# Patient Record
Sex: Female | Born: 1935
Health system: Southern US, Community
[De-identification: ages and names within clinical notes are randomized; demographics above are authoritative.]

## PROBLEM LIST (undated history)

## (undated) DIAGNOSIS — E213 Hyperparathyroidism, unspecified: Secondary | ICD-10-CM

## (undated) DIAGNOSIS — R001 Bradycardia, unspecified: Secondary | ICD-10-CM

## (undated) DIAGNOSIS — E119 Type 2 diabetes mellitus without complications: Secondary | ICD-10-CM

## (undated) DIAGNOSIS — Z5189 Encounter for other specified aftercare: Secondary | ICD-10-CM

## (undated) DIAGNOSIS — I639 Cerebral infarction, unspecified: Secondary | ICD-10-CM

## (undated) DIAGNOSIS — Z992 Dependence on renal dialysis: Secondary | ICD-10-CM

## (undated) DIAGNOSIS — I4891 Unspecified atrial fibrillation: Secondary | ICD-10-CM

## (undated) DIAGNOSIS — Z9889 Other specified postprocedural states: Secondary | ICD-10-CM

## (undated) DIAGNOSIS — K284 Chronic or unspecified gastrojejunal ulcer with hemorrhage: Secondary | ICD-10-CM

## (undated) DIAGNOSIS — R51 Headache: Secondary | ICD-10-CM

## (undated) DIAGNOSIS — E559 Vitamin D deficiency, unspecified: Secondary | ICD-10-CM

## (undated) DIAGNOSIS — G4730 Sleep apnea, unspecified: Secondary | ICD-10-CM

## (undated) DIAGNOSIS — M109 Gout, unspecified: Secondary | ICD-10-CM

## (undated) DIAGNOSIS — F419 Anxiety disorder, unspecified: Secondary | ICD-10-CM

## (undated) DIAGNOSIS — N183 Chronic kidney disease, stage 3 unspecified: Secondary | ICD-10-CM

## (undated) DIAGNOSIS — L299 Pruritus, unspecified: Secondary | ICD-10-CM

## (undated) DIAGNOSIS — I10 Essential (primary) hypertension: Secondary | ICD-10-CM

## (undated) DIAGNOSIS — R519 Headache, unspecified: Secondary | ICD-10-CM

## (undated) DIAGNOSIS — Z87442 Personal history of urinary calculi: Secondary | ICD-10-CM

## (undated) DIAGNOSIS — K219 Gastro-esophageal reflux disease without esophagitis: Secondary | ICD-10-CM

## (undated) DIAGNOSIS — I499 Cardiac arrhythmia, unspecified: Secondary | ICD-10-CM

## (undated) DIAGNOSIS — N186 End stage renal disease: Secondary | ICD-10-CM

## (undated) DIAGNOSIS — D638 Anemia in other chronic diseases classified elsewhere: Secondary | ICD-10-CM

## (undated) DIAGNOSIS — K274 Chronic or unspecified peptic ulcer, site unspecified, with hemorrhage: Secondary | ICD-10-CM

## (undated) DIAGNOSIS — R112 Nausea with vomiting, unspecified: Secondary | ICD-10-CM

## (undated) DIAGNOSIS — G459 Transient cerebral ischemic attack, unspecified: Secondary | ICD-10-CM

## (undated) DIAGNOSIS — R197 Diarrhea, unspecified: Secondary | ICD-10-CM

## (undated) HISTORY — DX: Bradycardia, unspecified: R00.1

## (undated) HISTORY — PX: BACK SURGERY: SHX140

## (undated) HISTORY — DX: Sleep apnea, unspecified: G47.30

## (undated) HISTORY — DX: Transient cerebral ischemic attack, unspecified: G45.9

## (undated) HISTORY — PX: BREAST LUMPECTOMY: SHX2

## (undated) HISTORY — PX: PARATHYROID EXPLORATION: SHX732

## (undated) HISTORY — PX: FOOT SURGERY: SHX648

## (undated) HISTORY — PX: APPENDECTOMY: SHX54

## (undated) HISTORY — DX: Hyperparathyroidism, unspecified: E21.3

## (undated) HISTORY — DX: Chronic kidney disease, stage 3 (moderate): N18.3

## (undated) HISTORY — PX: ABDOMINAL HYSTERECTOMY: SHX81

## (undated) HISTORY — PX: HERNIA REPAIR: SHX51

## (undated) HISTORY — DX: Chronic kidney disease, stage 3 unspecified: N18.30

## (undated) HISTORY — DX: Gout, unspecified: M10.9

## (undated) HISTORY — DX: Anemia in other chronic diseases classified elsewhere: D63.8

## (undated) HISTORY — PX: CHOLECYSTECTOMY: SHX55

---

## 1978-02-18 HISTORY — PX: BACK SURGERY: SHX140

## 1983-02-19 DIAGNOSIS — D638 Anemia in other chronic diseases classified elsewhere: Secondary | ICD-10-CM

## 1983-02-19 HISTORY — PX: CHOLECYSTECTOMY: SHX55

## 1983-02-19 HISTORY — PX: ABDOMINAL HYSTERECTOMY: SHX81

## 1983-02-19 HISTORY — DX: Anemia in other chronic diseases classified elsewhere: D63.8

## 1988-02-19 HISTORY — PX: FOOT SURGERY: SHX648

## 1993-02-18 DIAGNOSIS — I639 Cerebral infarction, unspecified: Secondary | ICD-10-CM

## 1993-02-18 HISTORY — DX: Cerebral infarction, unspecified: I63.9

## 1994-02-18 DIAGNOSIS — G459 Transient cerebral ischemic attack, unspecified: Secondary | ICD-10-CM

## 1994-02-18 HISTORY — DX: Transient cerebral ischemic attack, unspecified: G45.9

## 1997-06-20 ENCOUNTER — Other Ambulatory Visit: Admission: RE | Admit: 1997-06-20 | Discharge: 1997-06-20 | Payer: Self-pay | Admitting: Family Medicine

## 2000-05-30 ENCOUNTER — Encounter: Payer: Self-pay | Admitting: Family Medicine

## 2000-05-30 ENCOUNTER — Ambulatory Visit (HOSPITAL_COMMUNITY): Admission: RE | Admit: 2000-05-30 | Discharge: 2000-05-30 | Payer: Self-pay | Admitting: Family Medicine

## 2001-06-08 ENCOUNTER — Ambulatory Visit (HOSPITAL_COMMUNITY): Admission: RE | Admit: 2001-06-08 | Discharge: 2001-06-08 | Payer: Self-pay | Admitting: Family Medicine

## 2001-06-08 ENCOUNTER — Encounter: Payer: Self-pay | Admitting: Family Medicine

## 2001-07-23 ENCOUNTER — Ambulatory Visit (HOSPITAL_COMMUNITY): Admission: RE | Admit: 2001-07-23 | Discharge: 2001-07-23 | Payer: Self-pay | Admitting: Surgery

## 2001-07-23 ENCOUNTER — Encounter: Payer: Self-pay | Admitting: Surgery

## 2002-07-05 ENCOUNTER — Encounter: Payer: Self-pay | Admitting: Family Medicine

## 2002-07-05 ENCOUNTER — Ambulatory Visit (HOSPITAL_COMMUNITY): Admission: RE | Admit: 2002-07-05 | Discharge: 2002-07-05 | Payer: Self-pay | Admitting: Family Medicine

## 2002-11-16 ENCOUNTER — Encounter: Payer: Self-pay | Admitting: Surgery

## 2002-11-16 ENCOUNTER — Ambulatory Visit (HOSPITAL_COMMUNITY): Admission: RE | Admit: 2002-11-16 | Discharge: 2002-11-16 | Payer: Self-pay | Admitting: Surgery

## 2003-09-08 ENCOUNTER — Ambulatory Visit (HOSPITAL_COMMUNITY): Admission: RE | Admit: 2003-09-08 | Discharge: 2003-09-08 | Payer: Self-pay | Admitting: Family Medicine

## 2004-09-20 ENCOUNTER — Ambulatory Visit (HOSPITAL_COMMUNITY): Admission: RE | Admit: 2004-09-20 | Discharge: 2004-09-20 | Payer: Self-pay | Admitting: Family Medicine

## 2005-10-01 ENCOUNTER — Ambulatory Visit (HOSPITAL_COMMUNITY): Admission: RE | Admit: 2005-10-01 | Discharge: 2005-10-01 | Payer: Self-pay | Admitting: Family Medicine

## 2005-12-23 ENCOUNTER — Ambulatory Visit (HOSPITAL_COMMUNITY): Admission: RE | Admit: 2005-12-23 | Discharge: 2005-12-23 | Payer: Self-pay | Admitting: Surgery

## 2006-10-03 ENCOUNTER — Ambulatory Visit (HOSPITAL_COMMUNITY): Admission: RE | Admit: 2006-10-03 | Discharge: 2006-10-03 | Payer: Self-pay | Admitting: Family Medicine

## 2007-11-24 ENCOUNTER — Ambulatory Visit (HOSPITAL_COMMUNITY): Admission: RE | Admit: 2007-11-24 | Discharge: 2007-11-24 | Payer: Self-pay | Admitting: Family Medicine

## 2007-12-03 ENCOUNTER — Encounter: Admission: RE | Admit: 2007-12-03 | Discharge: 2007-12-03 | Payer: Self-pay | Admitting: Family Medicine

## 2007-12-03 ENCOUNTER — Encounter (INDEPENDENT_AMBULATORY_CARE_PROVIDER_SITE_OTHER): Payer: Self-pay | Admitting: Diagnostic Radiology

## 2008-02-22 ENCOUNTER — Encounter: Admission: RE | Admit: 2008-02-22 | Discharge: 2008-02-22 | Payer: Self-pay | Admitting: Surgery

## 2008-02-23 ENCOUNTER — Encounter (INDEPENDENT_AMBULATORY_CARE_PROVIDER_SITE_OTHER): Payer: Self-pay | Admitting: Surgery

## 2008-02-23 ENCOUNTER — Encounter: Admission: RE | Admit: 2008-02-23 | Discharge: 2008-02-23 | Payer: Self-pay | Admitting: Surgery

## 2008-02-23 ENCOUNTER — Ambulatory Visit (HOSPITAL_BASED_OUTPATIENT_CLINIC_OR_DEPARTMENT_OTHER): Admission: RE | Admit: 2008-02-23 | Discharge: 2008-02-23 | Payer: Self-pay | Admitting: Surgery

## 2008-07-21 ENCOUNTER — Ambulatory Visit (HOSPITAL_COMMUNITY): Admission: RE | Admit: 2008-07-21 | Discharge: 2008-07-21 | Payer: Self-pay | Admitting: Surgery

## 2008-09-29 ENCOUNTER — Ambulatory Visit (HOSPITAL_COMMUNITY): Admission: RE | Admit: 2008-09-29 | Discharge: 2008-09-30 | Payer: Self-pay | Admitting: Surgery

## 2008-12-05 ENCOUNTER — Encounter: Admission: RE | Admit: 2008-12-05 | Discharge: 2008-12-05 | Payer: Self-pay | Admitting: Family Medicine

## 2009-12-11 ENCOUNTER — Encounter: Admission: RE | Admit: 2009-12-11 | Discharge: 2009-12-11 | Payer: Self-pay | Admitting: Family Medicine

## 2010-03-11 ENCOUNTER — Encounter: Payer: Self-pay | Admitting: Family Medicine

## 2010-05-26 LAB — DIFFERENTIAL
Basophils Absolute: 0 10*3/uL (ref 0.0–0.1)
Basophils Relative: 1 % (ref 0–1)
Eosinophils Absolute: 0.1 10*3/uL (ref 0.0–0.7)
Lymphocytes Relative: 34 % (ref 12–46)
Monocytes Absolute: 0.4 10*3/uL (ref 0.1–1.0)
Neutro Abs: 5.1 10*3/uL (ref 1.7–7.7)

## 2010-05-26 LAB — URINALYSIS, ROUTINE W REFLEX MICROSCOPIC
Bilirubin Urine: NEGATIVE
Ketones, ur: NEGATIVE mg/dL
Nitrite: NEGATIVE
Protein, ur: NEGATIVE mg/dL
pH: 7.5 (ref 5.0–8.0)

## 2010-05-26 LAB — PROTIME-INR
INR: 0.9 (ref 0.00–1.49)
Prothrombin Time: 11.9 seconds (ref 11.6–15.2)

## 2010-05-26 LAB — COMPREHENSIVE METABOLIC PANEL
CO2: 32 mEq/L (ref 19–32)
Calcium: 11.6 mg/dL — ABNORMAL HIGH (ref 8.4–10.5)
Chloride: 104 mEq/L (ref 96–112)
Creatinine, Ser: 1.62 mg/dL — ABNORMAL HIGH (ref 0.4–1.2)
Potassium: 4.1 mEq/L (ref 3.5–5.1)
Sodium: 143 mEq/L (ref 135–145)

## 2010-05-26 LAB — CALCIUM
Calcium: 8.9 mg/dL (ref 8.4–10.5)
Calcium: 9.2 mg/dL (ref 8.4–10.5)

## 2010-05-26 LAB — CBC: WBC: 8.6 10*3/uL (ref 4.0–10.5)

## 2010-06-04 LAB — CBC
HCT: 42.1 % (ref 36.0–46.0)
Hemoglobin: 14.4 g/dL (ref 12.0–15.0)
MCHC: 34.2 g/dL (ref 30.0–36.0)
MCV: 92 fL (ref 78.0–100.0)
Platelets: 225 10*3/uL (ref 150–400)
RBC: 4.58 MIL/uL (ref 3.87–5.11)
RDW: 13.6 % (ref 11.5–15.5)
WBC: 7 10*3/uL (ref 4.0–10.5)

## 2010-06-04 LAB — BASIC METABOLIC PANEL
BUN: 17 mg/dL (ref 6–23)
CO2: 28 mEq/L (ref 19–32)
Calcium: 10.6 mg/dL — ABNORMAL HIGH (ref 8.4–10.5)
Chloride: 101 mEq/L (ref 96–112)
Creatinine, Ser: 1.3 mg/dL — ABNORMAL HIGH (ref 0.4–1.2)
GFR calc Af Amer: 49 mL/min — ABNORMAL LOW (ref >60)
GFR calc non Af Amer: 40 mL/min — ABNORMAL LOW (ref >60)
Glucose, Bld: 84 mg/dL (ref 70–99)
Potassium: 4.9 mEq/L (ref 3.5–5.1)
Sodium: 140 mEq/L (ref 135–145)

## 2010-06-04 LAB — URINALYSIS, ROUTINE W REFLEX MICROSCOPIC
Bilirubin Urine: NEGATIVE
Glucose, UA: NEGATIVE mg/dL
Hgb urine dipstick: NEGATIVE
Ketones, ur: NEGATIVE mg/dL
Nitrite: NEGATIVE
Protein, ur: NEGATIVE mg/dL
Specific Gravity, Urine: 1.009 (ref 1.005–1.030)
Urobilinogen, UA: 0.2 mg/dL (ref 0.0–1.0)
pH: 7 (ref 5.0–8.0)

## 2010-06-04 LAB — DIFFERENTIAL
Basophils Absolute: 0 10*3/uL (ref 0.0–0.1)
Basophils Relative: 1 % (ref 0–1)
Eosinophils Absolute: 0.1 10*3/uL (ref 0.0–0.7)
Eosinophils Relative: 1 % (ref 0–5)
Lymphocytes Relative: 34 % (ref 12–46)
Lymphs Abs: 2.4 10*3/uL (ref 0.7–4.0)
Monocytes Absolute: 0.4 10*3/uL (ref 0.1–1.0)
Monocytes Relative: 5 % (ref 3–12)
Neutro Abs: 4.2 10*3/uL (ref 1.7–7.7)
Neutrophils Relative %: 59 % (ref 43–77)

## 2010-07-03 NOTE — Op Note (Signed)
NAMEJACQUEL, OVERHOLT              ACCOUNT NO.:  [id number]   MEDICAL RECORD NO.:  [id number]          PATIENT TYPE:  OIB   LOCATION:  [id number]                         FACILITY:  [facility]   PHYSICIAN:  Earnstine Regal, MD      DATE OF BIRTH:  Jun 21, 1935   DATE OF PROCEDURE:  09/29/2008  DATE OF DISCHARGE:                               OPERATIVE REPORT   PREOPERATIVE DIAGNOSIS:  Primary hyperparathyroidism.   POSTOPERATIVE DIAGNOSIS:  Primary hyperparathyroidism.   PROCEDURE:  Neck exploration.   SURGEON:  Earnstine Regal, MD, FACS   ASSISTANT:  Stark Klein, MD   ANESTHESIA:  General per Dr. Lillia Abed.   PREPARATION:  ChloraPrep.   COMPLICATIONS:  None.   ESTIMATED BLOOD LOSS:  Minimal.   INDICATIONS:  The patient is a 75 year old white female followed for  many years for presumed primary hyperparathyroidism.  The patient has  undergone multiple nuclear medicine sestamibi scans in 2003, 2004, and  2007.  All of these studies have failed to localize a parathyroid  adenoma.  However, biochemical evidence of parathyroid disease persists  with an elevated intact parathyroid hormone level of 155 and a calcium  level in the upper range of normal at 10.2 and a 24-hour urine  collection for calcium elevated at 275.  After discussion with the  patient, a decision has been made to proceed with neck exploration.  It  helps at identifying a parathyroid adenoma for resection.   BODY OF REPORT:  Procedure was done in OR #16 at the [place].  The patient was brought to the operating room and  placed in supine position on the operating room table.  Following  administration of general anesthesia, the patient was positioned and  then prepped and draped in the usual strict aseptic fashion.  After  ascertaining that an adequate level of anesthesia had been achieved, a  Kocher incision was made with a #15 blade.  Dissection was carried  through subcutaneous tissues.   Platysma was divided with the  electrocautery and hemostasis was achieved.  Subplatysmal flaps were  elevated cephalad and caudad.  A Mahorner and self-retaining retractors  were placed for exposure.  Strap muscles were incised in the midline.  Dissection was initially begun on the left.  Left thyroid lobe was  completely mobilized.  Dissection was carried from high above the  superior pole of the thyroid along the posterior aspect of the thyroid  lobe to the inferior pole of the thyroid and into the thyrothymic tract.  No evidence of adenoma was identified.  Dissection was carried  posteriorly to the spine and the retroesophageal space was opened.  Again, no evidence of parathyroid adenoma.  Inferior thyroid artery was  dissected out from the carotid to the thyroid.  Recurrent nerve was  dissected out and no evidence of parathyroid adenoma was found within  several centimeters of the crossing of the recurrent nerve and the  inferior thyroid artery.  Carotid sheath was opened and the carotid  artery was dissected out for several centimeters in each direction.  No  evidence  of adenoma within the carotid sheath on the left.   Dry pack was placed in the left neck.  We then turned our attention to  the right side.  Again, the right thyroid lobe was exposed by reflecting  strap muscles laterally.  Small venous tributaries were divided between  Ligaclips.  The entire right neck was dissected out in a similar fashion  extending from high above the right superior pole along the posterior  aspect of the thyroid lobe to the inferior pole and down the thyrothymic  tract.  No parathyroid adenoma was identified.  Carotid sheath was  opened widely and explored.  No evidence of adenoma.  Inferior thyroid  artery was skeletonized from the carotid to the thyroid gland.  Recurrent laryngeal nerve was dissected out for several centimeters  along its length.  No evidence of adenoma seen in the  tracheoesophageal  groove.  Esophagus was also mobilized from the right and again there was  no evidence of adenoma in the retroesophageal space.  Dissection was  carried beneath the sternum to the level of the innominate.  Partial  thymectomy was performed.  No evidence of parathyroid adenoma.   Indeed throughout the exploration, no definitive parathyroid tissue was  identified at any point in time, although there was a large amount of  adipose tissue present within the operative field.   After re-exploring both the left and right sides again, a decision was  made to discontinue exploration.  Neck was irrigated with warm saline.  Surgicel was placed in the operative field.  Strap muscles were  reapproximated in the midline with interrupted 3-0 Vicryl sutures.  Platysma was closed with interrupted 3-0 Vicryl sutures.  Skin was  closed with running 4-0 Monocryl subcuticular suture.  Wound was washed  and dried and benzoin and Steri-Strips were applied.  Sterile dressings  were applied.  The patient was awakened from anesthesia and brought to  the recovery room in stable condition.  The patient tolerated the  procedure well.      Earnstine Regal, MD  Electronically Signed     Earnstine Regal, MD  Electronically Signed    TMG/MEDQ  D:  09/29/2008  T:  09/30/2008  Job:  [id number]   cc:   Tammy R. Modena Morrow, M.D.

## 2010-07-06 NOTE — Op Note (Signed)
NAMEMECIA, KRAFT              ACCOUNT NO.:  [id number]   MEDICAL RECORD NO.:  [id number]          PATIENT TYPE:  AMB   LOCATION:  [place]                          FACILITY:  [facility]   PHYSICIAN:  Earnstine Regal, MD      DATE OF BIRTH:  [birth year]   DATE OF PROCEDURE:  02/23/2007  DATE OF DISCHARGE:                               OPERATIVE REPORT   PREOPERATIVE DIAGNOSIS:  Abnormal mammogram of left breast, vascular  neoplasm with cytologic atypia.   POSTOPERATIVE DIAGNOSIS:  Abnormal mammogram of left breast, vascular  neoplasm with cytologic atypia.   PROCEDURE:  Left breast excisional biopsy with wire localization.   SURGEON:  Earnstine Regal, MD, FACS   ANESTHESIA:  Local with intravenous sedation.   PREPARATION:  Betadine.   COMPLICATIONS:  None.   BLOOD LOSS:  Minimal.   INDICATIONS:  The patient is a 75 year old white female, well known to  my surgical practice.  Routine mammogram showed an abnormality in the  left breast.  Vacuum-assisted core needle biopsy demonstrated a vascular  neoplasm with mild cytologic atypia.  Tumor measured approximately 6 mm  in size.  Surgical excision is recommended.   BODY OF REPORT:  Procedure was done in OR #8 at the [facility]  The patient was brought to the operating room and placed in the  supine position on the operating room table.  Following administration  of intravenous sedation, the skin was prepped and draped in the usual  strict aseptic fashion.  After ascertaining that an adequate level of  sedation had been obtained, the skin was anesthetized with local  anesthetic.  A curvilinear incision was made at the site of guidewire  placement in the upper outer quadrant of the left breast.  Dissection  was carried into the subcutaneous tissues with electrocautery and  hemostasis was obtained with electrocautery.  A core of breast tissue  was excised around the guidewire using the electrocautery for  hemostasis.   Dissection was carried to the tip of the guidewire.  The  entire specimen was excised with the guidewire in position.  Using  Faxitron mammogram machine, an image was obtained which confirmed that  the lesion in question was present within the excised specimen.  Specimen was then submitted to pathology for review.   Hemostasis was obtained throughout the wound.  Skin was closed with  running 4-0 Vicryl subcuticular suture.  Wound was washed and dried, and  benzoin and Steri-Strips were applied.  Sterile dressings were applied.  The patient was awakened from anesthesia and brought to the recovery  room in stable condition.  The patient tolerated the procedure well.      Earnstine Regal, MD  Electronically Signed     TMG/MEDQ  D:  02/23/2008  T:  02/24/2008  Job:  [id number]   cc:   Tammy R. Modena Morrow, M.D.  Chaney Born, M.D.

## 2010-11-09 ENCOUNTER — Other Ambulatory Visit: Payer: Self-pay | Admitting: Family Medicine

## 2010-11-09 DIAGNOSIS — Z1231 Encounter for screening mammogram for malignant neoplasm of breast: Secondary | ICD-10-CM

## 2010-12-17 ENCOUNTER — Ambulatory Visit
Admission: RE | Admit: 2010-12-17 | Discharge: 2010-12-17 | Disposition: A | Discharge status: Home / Self Care | Payer: Medicare Other | Source: Ambulatory Visit | Attending: Family Medicine | Admitting: Family Medicine

## 2010-12-17 DIAGNOSIS — Z1231 Encounter for screening mammogram for malignant neoplasm of breast: Secondary | ICD-10-CM

## 2011-07-28 ENCOUNTER — Encounter (HOSPITAL_COMMUNITY): Payer: Self-pay | Admitting: Emergency Medicine

## 2011-07-28 ENCOUNTER — Emergency Department (HOSPITAL_COMMUNITY): Payer: Medicare Other

## 2011-07-28 ENCOUNTER — Emergency Department (HOSPITAL_COMMUNITY)
Admission: EM | Admit: 2011-07-28 | Discharge: 2011-07-28 | Disposition: A | Discharge status: Home / Self Care | Payer: Medicare Other | Attending: Emergency Medicine | Admitting: Emergency Medicine

## 2011-07-28 DIAGNOSIS — W010XXA Fall on same level from slipping, tripping and stumbling without subsequent striking against object, initial encounter: Secondary | ICD-10-CM | POA: Insufficient documentation

## 2011-07-28 DIAGNOSIS — I10 Essential (primary) hypertension: Secondary | ICD-10-CM | POA: Insufficient documentation

## 2011-07-28 DIAGNOSIS — S0003XA Contusion of scalp, initial encounter: Secondary | ICD-10-CM | POA: Insufficient documentation

## 2011-07-28 DIAGNOSIS — S20219A Contusion of unspecified front wall of thorax, initial encounter: Secondary | ICD-10-CM | POA: Insufficient documentation

## 2011-07-28 DIAGNOSIS — Z79899 Other long term (current) drug therapy: Secondary | ICD-10-CM | POA: Insufficient documentation

## 2011-07-28 DIAGNOSIS — Y92009 Unspecified place in unspecified non-institutional (private) residence as the place of occurrence of the external cause: Secondary | ICD-10-CM | POA: Insufficient documentation

## 2011-07-28 DIAGNOSIS — Z7901 Long term (current) use of anticoagulants: Secondary | ICD-10-CM | POA: Insufficient documentation

## 2011-07-28 DIAGNOSIS — S0093XA Contusion of unspecified part of head, initial encounter: Secondary | ICD-10-CM

## 2011-07-28 DIAGNOSIS — E079 Disorder of thyroid, unspecified: Secondary | ICD-10-CM | POA: Insufficient documentation

## 2011-07-28 HISTORY — DX: Essential (primary) hypertension: I10

## 2011-07-28 LAB — CBC
HCT: 40.4 % (ref 36.0–46.0)
Hemoglobin: 13.7 g/dL (ref 12.0–15.0)
MCHC: 33.9 g/dL (ref 30.0–36.0)
RDW: 13.6 % (ref 11.5–15.5)
WBC: 8.9 10*3/uL (ref 4.0–10.5)

## 2011-07-28 LAB — BASIC METABOLIC PANEL
Chloride: 103 mEq/L (ref 96–112)
GFR calc Af Amer: 36 mL/min — ABNORMAL LOW (ref >90)
GFR calc non Af Amer: 31 mL/min — ABNORMAL LOW (ref >90)
Glucose, Bld: 107 mg/dL — ABNORMAL HIGH (ref 70–99)
Potassium: 4.1 mEq/L (ref 3.5–5.1)
Sodium: 142 mEq/L (ref 135–145)

## 2011-07-28 LAB — PROTIME-INR
INR: 0.94 (ref 0.00–1.49)
Prothrombin Time: 12.8 seconds (ref 11.6–15.2)

## 2011-07-28 NOTE — ED Provider Notes (Signed)
76 year old female tripped over a hose and fell striking her head. There was no loss of consciousness. She did suffer a contusion and small laceration to the left side of her forehead. She is neurologically intact although she does have several for areas that have bruises. CT scan showed no bony or intracranial injury. She will need to be advised of risk of delayed bleeding given that she is on Plavix.  Kim Fuel, MD [phone]

## 2011-07-28 NOTE — Discharge Instructions (Signed)
Chest Contusion You have been checked for injuries to your chest. Your caregiver has not found injuries serious enough to require hospitalization. It is common to have bruises and sore muscles after an injury. These tend to feel worse the first 24 hours. You may gradually develop more stiffness and soreness over the next several hours to several days. This usually feels worse the first morning following your injury. After a few days, you will usually begin to improve. The amount of improvement depends on the amount of damage. Following the accident, if the pain in any area continues to increase or you develop new areas of pain, you should see your primary caregiver or return to the Emergency Department for re-evaluation. HOME CARE INSTRUCTIONS   Put ice on sore areas every 2 hours for 20 minutes while awake for the next 2 days.   Drink extra fluids. Do not drink alcohol.   Activity as tolerated. Lifting may make pain worse.   Only take over-the-counter or prescription medicines for pain, discomfort, or fever as directed by your caregiver. Do not use aspirin. This may increase bruising or increase bleeding.  SEEK IMMEDIATE MEDICAL CARE IF:   There is a worsening of any of the problems that brought you in for care.   Shortness of breath, dizziness or fainting develop.   You have chest pain, difficulty breathing, or develop pain going down the left arm or up into jaw.   You feel sick to your stomach (nausea), vomiting or sweats.   You have increasing belly (abdominal) discomfort.   There is blood in your urine, stool, or if you vomit blood.   There is pain in either shoulder in an area where a shoulder strap would be.   You have feelings of lightheadedness, or if you should have a fainting episode.   You have numbness, tingling, weakness, or problems with the use of your arms or legs.   Severe headaches not relieved with medications develop.   You have a change in bowel or bladder  control.   There is increasing pain in any areas of the body.  If you feel your symptoms are worsening, and you are not able to see your primary caregiver, return to the Emergency Department immediately. MAKE SURE YOU:   Understand these instructions.   Will watch your condition.   Will get help right away if you are not doing well or get worse.  Document Released: 10/30/2000 Document Revised: 01/24/2011 Document Reviewed: 09/23/2007 [facility] Patient Information 2012 [place].Facial and Scalp Contusions You have a contusion (bruise) on your face or scalp. Injuries around the face and head generally cause a lot of swelling, especially around the eyes. This is because the blood supply to this area is good and tissues are loose. Swelling from a contusion is usually better in 2-3 days. It may take a week or longer for a "black eye" to clear up completely. HOME CARE INSTRUCTIONS   Apply ice packs to the injured area for about 15 to 20 minutes, 3 to 4 times a day, for the first couple days. This helps keep swelling down.   Use mild pain medicine as needed or instructed by your caregiver.   You may have a mild headache, slight dizziness, nausea, and weakness for a few days. This usually clears up with bed rest and mild pain medications.   Contact your caregiver if you are concerned about facial defects or have any difficulty with your bite or develop pain with chewing.  SEEK IMMEDIATE MEDICAL CARE IF:  You develop severe pain or a headache, unrelieved by medication.   You develop unusual sleepiness, confusion, personality changes, or vomiting.   You have a persistent nosebleed, double or blurred vision, or drainage from the nose or ear.   You have difficulty walking or using your arms or legs.  MAKE SURE YOU:   Understand these instructions.   Will watch your condition.   Will get help right away if you are not doing well or get worse.  Document Released: 03/14/2004 Document  Revised: 01/24/2011 Document Reviewed: 12/21/2010 [facility] Patient Information 2012 [place].

## 2011-07-28 NOTE — ED Provider Notes (Signed)
Medical screening examination/treatment/procedure(s) were conducted as a shared visit with non-physician practitioner(s) and myself.  I personally evaluated the patient during the encounter   Delora Fuel, MD [phone]

## 2011-07-28 NOTE — ED Notes (Signed)
Pt tripped over garden hose this morning. Pt hit head on concrete. Pt has laceration to forehead and abrasion to right elbow. Pt unsure if +LOC.

## 2011-07-28 NOTE — ED Provider Notes (Signed)
History     CSN: [id number]  Arrival date & time 07/28/11  37   First MD Initiated Contact with Patient 07/28/11 1129      Chief Complaint  Patient presents with  . Fall  . Head Injury  . Head Laceration  . Abrasion    (Consider location/radiation/quality/duration/timing/severity/associated sxs/prior treatment) HPI  Patient to the ED after fall, having tripped over a garden hose while straightening up her porch this morning. She states, ' I first hit my chest and then my forehead hit next. I only saw a lot of blood so i knew i needed to come to hte ED'. The patient denies LOC, syncope, hx of multiple falls, or pain anywhere else. She is on Plavix blood thinners. The patient is in no acute distress and VSS at this time. Denies headache, declines pain medications at this time.  Past Medical History  Diagnosis Date  . Hypertension   . Thyroid disease   . Renal disorder     Past Surgical History  Procedure Date  . Cholecystectomy   . Abdominal hysterectomy   . Back surgery   . Breast lumpectomy   . Parathyroid exploration     History reviewed. No pertinent family history.  History  Substance Use Topics  . Smoking status: Never Smoker   . Smokeless tobacco: Not on file  . Alcohol Use: No    OB History    Grav Para Term Preterm Abortions TAB SAB Ect Mult Living                  Review of Systems   HEENT: denies blurry vision or change in hearing PULMONARY: Denies difficulty breathing and SOB CARDIAC: denies chest pain or heart palpitations MUSCULOSKELETAL:  denies being unable to ambulate ABDOMEN AL: denies abdominal pain GU: denies loss of bowel or urinary control NEURO: denies numbness and tingling in extremities SKIN: no new rashes PSYCH: patient behavior is normal NECK: denies neck pain     Allergies  Codeine and Statins  Home Medications   Current Outpatient Rx  Name Route Sig Dispense Refill  . CINACALCET HCL 30 MG PO TABS Oral Take 30 mg  by mouth daily with supper.    Marland Kitchen CLONIDINE HCL 0.1 MG PO TABS Oral Take 0.1 mg by mouth at bedtime.    . CLOPIDOGREL BISULFATE 75 MG PO TABS Oral Take 75 mg by mouth every morning.    Marland Kitchen DIPHENHYDRAMINE-APAP (SLEEP) 25-500 MG PO TABS Oral Take 1 tablet by mouth at bedtime.    . FUROSEMIDE 20 MG PO TABS Oral Take 20 mg by mouth every morning.    Marland Kitchen LOSARTAN POTASSIUM 100 MG PO TABS Oral Take 100 mg by mouth every morning.    Marland Kitchen METOPROLOL SUCCINATE ER 100 MG PO TB24 Oral Take 100 mg by mouth daily with supper. Take with or immediately following a meal.    . FISH OIL PO Oral Take 3,000 mg by mouth daily.      BP 157/88  Pulse 67  Temp(Src) 97.8 F (36.6 C) (Oral)  Resp 16  SpO2 98%  Physical Exam  Nursing note and vitals reviewed. Constitutional: She appears well-developed and well-nourished. No distress.  HENT:  Head: Normocephalic and atraumatic.    Mouth/Throat: Oropharynx is clear and moist and mucous membranes are normal.       Patient has 1.5cm laceration over left eyebrow which bleeding is controlled with surrounding hematoma.  Eyes: Pupils are equal, round, and reactive to light.  Neck: Normal range of motion. Neck supple.  Cardiovascular: Normal rate and regular rhythm.   Pulmonary/Chest: Effort normal. She exhibits tenderness. She exhibits no laceration, no crepitus, no deformity and no swelling.    Abdominal: Soft.  Neurological: She is alert.  Skin: Skin is warm and dry.    ED Course  Procedures (including critical care time)  Labs Reviewed  BASIC METABOLIC PANEL - Abnormal; Notable for the following:    Glucose, Bld 107 (*)    Creatinine, Ser 1.58 (*)    GFR calc non Af Amer 31 (*)    GFR calc Af Amer 36 (*)    All other components within normal limits  PROTIME-INR  CBC   Dg Chest 2 View  07/28/2011  *RADIOLOGY REPORT*  Clinical Data: Fall with chest pain.  CHEST - 2 VIEW  Comparison: 09/27/2008  Findings: Two views of the chest were obtained.  There is mild  haziness at the left lung base.  No evidence for a pneumothorax. Heart and mediastinum are stable.  Stable linear density near the lateral left eighth rib may be related to the pleural surface and not consistent with a rib fracture.  Surgical clips in the right upper abdomen.  Trachea is midline. Few streaky densities at the right lung base. Surgical clips in the right lower neck.  IMPRESSION: Linear and vague basilar densities.  Findings most likely represent atelectasis.  Original Report Authenticated By: Markus Daft, M.D.   Ct Head Wo Contrast  07/28/2011  *RADIOLOGY REPORT*  Clinical Data: Fall, head injury  CT HEAD WITHOUT CONTRAST  Technique:  Contiguous axial images were obtained from the base of the skull through the vertex without contrast.  Comparison: None.  Findings: No skull fracture is noted.  There is soft tissue swelling and scalp swelling in the left frontal region.  Small subcutaneous hematoma measures 9 mm.  Atherosclerotic calcifications of carotid siphon are noted.  Mild cerebral atrophy.  No intracranial hemorrhage, mass effect or midline shift.  No acute infarction.  No mass lesion is noted on this unenhanced scan.  No intra or extra-axial fluid collection.  IMPRESSION:  1.  No skull fracture is noted.  There is scalp swelling subcutaneous stranding and small subcutaneous hematoma in the left frontal region.  Mild cerebral atrophy.  No acute intracranial abnormality.  Original Report Authenticated By: Lahoma Crocker, M.D.     1. Head contusion   2. Contusion of ribs       MDM  Dr. Roxanne Mins has evaluated patient as well. Head CT and rib images are non acute.  Laceration to forehead closed with Dermabond. Pt tolerated procedure well.  Patient is to follow-up with PCP Dr. Modena Morrow within the next week.  Can take Tylenol or Ibuprofen for pain.  Pt has been advised of the symptoms that warrant their return to the ED. Patient has voiced understanding and has agreed to follow-up with the PCP  or specialist.         Linus Mako, [place] 07/28/11 1330

## 2011-12-04 ENCOUNTER — Other Ambulatory Visit: Payer: Self-pay | Admitting: Family Medicine

## 2011-12-04 DIAGNOSIS — Z1231 Encounter for screening mammogram for malignant neoplasm of breast: Secondary | ICD-10-CM

## 2012-01-02 ENCOUNTER — Ambulatory Visit
Admission: RE | Admit: 2012-01-02 | Discharge: 2012-01-02 | Disposition: A | Discharge status: Home / Self Care | Payer: Medicare Other | Source: Ambulatory Visit | Attending: Family Medicine | Admitting: Family Medicine

## 2012-01-02 DIAGNOSIS — Z1231 Encounter for screening mammogram for malignant neoplasm of breast: Secondary | ICD-10-CM

## 2012-02-19 HISTORY — PX: EYE SURGERY: SHX253

## 2012-04-20 ENCOUNTER — Other Ambulatory Visit: Payer: Self-pay | Admitting: *Deleted

## 2012-09-18 ENCOUNTER — Ambulatory Visit (INDEPENDENT_AMBULATORY_CARE_PROVIDER_SITE_OTHER): Payer: Medicare Other | Admitting: Cardiology

## 2012-09-18 ENCOUNTER — Encounter: Payer: Self-pay | Admitting: Cardiology

## 2012-09-18 VITALS — BP 152/102 | HR 61 | Ht 64.0 in | Wt 165.8 lb

## 2012-09-18 DIAGNOSIS — R5383 Other fatigue: Secondary | ICD-10-CM | POA: Insufficient documentation

## 2012-09-18 DIAGNOSIS — R5381 Other malaise: Secondary | ICD-10-CM

## 2012-09-18 DIAGNOSIS — N183 Chronic kidney disease, stage 3 unspecified: Secondary | ICD-10-CM

## 2012-09-18 DIAGNOSIS — I498 Other specified cardiac arrhythmias: Secondary | ICD-10-CM

## 2012-09-18 DIAGNOSIS — R001 Bradycardia, unspecified: Secondary | ICD-10-CM

## 2012-09-18 NOTE — Progress Notes (Signed)
Kim Burns Date of Birth: June 01, 1935 Medical Record [id number]  History of Present Illness: Kim Burns is seen in the request of Dr. Marval Regal for evaluation of bradycardia. She is a pleasant 77 year old white female who has a history of chronic hypertension and chronic kidney disease stage III. Patient has been on chronic therapy with clonidine and metoprolol. Recently she was started on an ARB. She mentioned that she had had some symptoms of fatigue. She has been noted to be bradycardic at times with heart rates down to 50 on her home blood pressure monitor. On her last renal followup her pulse rate was 63. She states she has felt fatigued for a number of years. This actually predates her being placed on either metoprolol or clonidine. She denies any dizziness or syncope. She has no significant chest pain or shortness of breath.   Current Outpatient Prescriptions on File Prior to Visit  Medication Sig Dispense Refill  . cinacalcet (SENSIPAR) 30 MG tablet Take 30 mg by mouth daily with supper.      . cloNIDine (CATAPRES) 0.1 MG tablet Take 0.1 mg by mouth at bedtime.      . clopidogrel (PLAVIX) 75 MG tablet Take 75 mg by mouth every morning.      . diphenhydramine-acetaminophen (TYLENOL PM) 25-500 MG TABS Take 1 tablet by mouth at bedtime.      . furosemide (LASIX) 20 MG tablet Take 20 mg by mouth every morning.      . metoprolol succinate (TOPROL-XL) 100 MG 24 hr tablet Take 100 mg by mouth daily with supper. Take with or immediately following a meal.      . Omega-3 Fatty Acids (FISH OIL PO) Take 3,000 mg by mouth daily.      Marland Kitchen losartan (COZAAR) 100 MG tablet Take 100 mg by mouth every morning.       No current facility-administered medications on file prior to visit.    Allergies  Allergen Reactions  . Codeine Other (See Comments)    headache  . Statins Other (See Comments)    Muscle weakness    Past Medical History  Diagnosis Date  . Hypertension   . Thyroid disease     . CKD (chronic kidney disease) stage 3, GFR 30-59 ml/min   . Bradycardia   . TIA (transient ischemic attack)   . Anemia of chronic disease   . Gout   . Hyperparathyroidism   . Sleep apnea     Past Surgical History  Procedure Laterality Date  . Cholecystectomy    . Abdominal hysterectomy    . Back surgery    . Breast lumpectomy    . Parathyroid exploration      History  Smoking status  . Never Smoker   Smokeless tobacco  . Not on file    History  Alcohol Use No    Family History  Problem Relation Age of Onset  . Heart disease Mother     Review of Systems: The review of systems is positive for chronic fatigue. She dates this to when her husband passed away 5 years ago. She becomes very upset discussing his death. All other systems were reviewed and are negative.  Physical Exam: BP 152/102  Pulse 61  Ht 5\' 4"  (1.626 m)  Wt 165 lb 12.8 oz (75.206 kg)  BMI 28.45 kg/m2  SpO2 99% She is a pleasant, elderly white female in no acute distress. HEENT: Normocephalic, atraumatic. Pupils are equal round and reactive. Extraocular movements are full. Oropharynx  is clear. Neck is without jugular venous distention, adenopathy, thyroid, or bruits. Lungs: Clear Cardiovascular: Regular rate and rhythm. Normal S1 and S2. No gallop, murmur, or click. Abdomen: Soft and nontender. No hepatosplenomegaly or masses. Bowel sounds are positive. Extremities are without edema. Pulses are 2+ and symmetric. Skin: Warm and dry Neuro: Alert and oriented x3, cranial nerves II through XII are intact. Mood is depressed. LABORATORY DATA: ECG demonstrates normal sinus rhythm with a rate of 59 beats per minute. QS complex in leads V1 and V2. Otherwise normal.  Assessment / Plan: 1. Bradycardia. Patient's pulse rate is appropriate for the medication she is currently taking. There is really no indication that she has any symptoms related to her bradycardia. She has chronic fatigue but this predates her  medication and I think is unlikely to be related to bradycardia. Her cardiac exam is normal. I recommended no further evaluation or treatment at this time. If she should develop symptoms of dizziness or syncope we will need to reevaluate. 2. Fatigue. Chronic. Patient does seem to have a component of depression. I recommended she continue on her current antihypertensive therapy. Certainly clonidine can contribute to fatigue but her blood pressure has been difficult to control and with her kidney disease her options are more limited. 3. Chronic kidney disease stage III. 4. Hypertension

## 2012-09-18 NOTE — Patient Instructions (Signed)
Continue your current therapy  Let me know if you develop dizzyness or passing out.  I will see you as needed.

## 2012-11-27 ENCOUNTER — Other Ambulatory Visit: Payer: Self-pay

## 2012-11-27 DIAGNOSIS — Z1231 Encounter for screening mammogram for malignant neoplasm of breast: Secondary | ICD-10-CM

## 2013-01-05 ENCOUNTER — Ambulatory Visit
Admission: RE | Admit: 2013-01-05 | Discharge: 2013-01-05 | Disposition: A | Discharge status: Home / Self Care | Payer: Medicare Other | Source: Ambulatory Visit

## 2013-01-05 DIAGNOSIS — Z1231 Encounter for screening mammogram for malignant neoplasm of breast: Secondary | ICD-10-CM

## 2013-03-29 ENCOUNTER — Encounter (INDEPENDENT_AMBULATORY_CARE_PROVIDER_SITE_OTHER): Payer: Self-pay | Admitting: Surgery

## 2013-03-29 ENCOUNTER — Ambulatory Visit (INDEPENDENT_AMBULATORY_CARE_PROVIDER_SITE_OTHER): Payer: Medicare Other | Admitting: Surgery

## 2013-03-29 VITALS — BP 138/78 | HR 72 | Temp 98.3°F | Resp 14 | Ht 64.5 in | Wt 169.8 lb

## 2013-03-29 DIAGNOSIS — R220 Localized swelling, mass and lump, head: Secondary | ICD-10-CM

## 2013-03-29 DIAGNOSIS — R221 Localized swelling, mass and lump, neck: Secondary | ICD-10-CM

## 2013-03-29 NOTE — Progress Notes (Signed)
General Surgery [facility]  Chief Complaint  Patient presents with  . New Evaluation    evaluate mass in neck - referral from Dr. Florina Ou    HISTORY: Patient is a 78 year old female known to my surgical practice. Patient had undergone previous parathyroidectomy and breast biopsy. She is now referred by her primary care physician for evaluation of a soft tissue mass in the posterior neck. Patient is not sure how long this has been present but it is increasing in size and causing her some discomfort. She was evaluated by her primary care physician is now referred for possible surgical excision.  Other than parathyroid exploration, the patient has not had prior surgery on the neck. She has had no previous such lesions removed.  Past Medical History  Diagnosis Date  . Hypertension   . Thyroid disease   . CKD (chronic kidney disease) stage 3, GFR 30-59 ml/min   . Bradycardia   . TIA (transient ischemic attack)   . Anemia of chronic disease   . Gout   . Hyperparathyroidism   . Sleep apnea     Current Outpatient Prescriptions  Medication Sig Dispense Refill  . atorvastatin (LIPITOR) 10 MG tablet       . calcitRIOL (ROCALTROL) 0.25 MCG capsule       . cinacalcet (SENSIPAR) 30 MG tablet Take 30 mg by mouth daily with supper.      . cloNIDine (CATAPRES) 0.1 MG tablet Take 0.1 mg by mouth at bedtime.      . clopidogrel (PLAVIX) 75 MG tablet Take 75 mg by mouth every morning.      . diphenhydramine-acetaminophen (TYLENOL PM) 25-500 MG TABS Take 1 tablet by mouth at bedtime.      . Ergocalciferol (VITAMIN D2 PO) Take by mouth once a week.      . Febuxostat (ULORIC) 80 MG TABS Take by mouth daily.      . fenofibrate (TRICOR) 145 MG tablet Take 145 mg by mouth daily.      . furosemide (LASIX) 20 MG tablet Take 20 mg by mouth every morning.      . Omega-3 Fatty Acids (FISH OIL PO) Take 3,000 mg by mouth daily.      Marland Kitchen telmisartan (MICARDIS) 80 MG tablet Take 80 mg by  mouth daily.       No current facility-administered medications for this visit.    Allergies  Allergen Reactions  . Codeine Other (See Comments)    headache  . Statins Other (See Comments)    Muscle weakness    Family History  Problem Relation Age of Onset  . Heart disease Mother   . Stroke Mother   . Cancer Father     colon  . Cancer Brother     brain    History   Social History  . Marital Status: Widowed    Spouse Name: N/A    Number of Children: N/A  . Years of Education: N/A   Social History Main Topics  . Smoking status: Never Smoker   . Smokeless tobacco: Never Used  . Alcohol Use: No  . Drug Use: No  . Sexual Activity: None   Other Topics Concern  . None   Social History Narrative  . None    REVIEW OF SYSTEMS - PERTINENT POSITIVES ONLY: Gradual enlargement. Mild discomfort. No history of infection.  EXAM: Filed Vitals:   03/29/13 1319  BP: 138/78  Pulse: 72  Temp: 98.3 F (36.8 C)  Resp: 14    GENERAL: well-developed, well-nourished, no acute distress HEENT: normocephalic; pupils equal and reactive; sclerae clear; dentition good; mucous membranes moist NECK:  Well-healed anterior cervical incision; no palpable masses in the thyroid bed; symmetric on extension; no palpable anterior or posterior cervical lymphadenopathy; no supraclavicular masses; no tenderness; posteriorly just to the right of midline is a 2.5 cm soft tissue mass which appears attached to the skin and extended into the underlying subcutaneous tissues and possibly into the underlying musculature. CHEST: clear to auscultation bilaterally without rales, rhonchi, or wheezes CARDIAC: regular rate and rhythm without significant murmur; peripheral pulses are full EXT:  non-tender without edema; no deformity NEURO: no gross focal deficits; no sign of tremor   LABORATORY RESULTS: See [facility] (CHL-Epic) for most recent results  RADIOLOGY RESULTS: See [facility]  (CHL-Epic) for most recent results  IMPRESSION: Soft tissue mass posterior neck, 2.5 cm, enlarging  PLAN: Patient and I discussed this finding. This may represent an epidermal inclusion cyst or a benign lipoma. However, it is moderate in size and appears to extend through the subcutaneous tissues and possibly into the underlying musculature. It has been enlarging. It does cause her some discomfort. Therefore I have recommended surgical excision for definitive diagnosis and management. Patient agrees. We will arrange for this as an outpatient surgical procedure.  The risks and benefits of the procedure have been discussed at length with the patient.  The patient understands the proposed procedure, potential alternative treatments, and the course of recovery to be expected.  All of the patient's questions have been answered at this time.  The patient wishes to proceed with surgery.  Earnstine Regal, MD, [facility]  Primary Care Physician: Florina Ou, MD

## 2013-03-29 NOTE — Patient Instructions (Signed)
Epidermal Cyst An epidermal cyst is sometimes called a sebaceous cyst, epidermal inclusion cyst, or infundibular cyst. These cysts usually contain a substance that looks "pasty" or "cheesy" and may have a bad smell. This substance is a protein called keratin. Epidermal cysts are usually found on the face, neck, or trunk. They may also occur in the vaginal area or other parts of the genitalia of both men and women. Epidermal cysts are usually small, painless, slow-growing bumps or lumps that move freely under the skin. It is important not to try to pop them. This may cause an infection and lead to tenderness and swelling. CAUSES  Epidermal cysts may be caused by a deep penetrating injury to the skin or a plugged hair follicle, often associated with acne. SYMPTOMS  Epidermal cysts can become inflamed and cause:  Redness.  Tenderness.  Increased temperature of the skin over the bumps or lumps.  Grayish-white, bad smelling material that drains from the bump or lump. DIAGNOSIS  Epidermal cysts are easily diagnosed by your caregiver during an exam. Rarely, a tissue sample (biopsy) may be taken to rule out other conditions that may resemble epidermal cysts. TREATMENT   Epidermal cysts often get better and disappear on their own. They are rarely ever cancerous.  If a cyst becomes infected, it may become inflamed and tender. This may require opening and draining the cyst. Treatment with antibiotics may be necessary. When the infection is gone, the cyst may be removed with minor surgery.  Small, inflamed cysts can often be treated with antibiotics or by injecting steroid medicines.  Sometimes, epidermal cysts become large and bothersome. If this happens, surgical removal in your caregiver's office may be necessary. HOME CARE INSTRUCTIONS  Only take over-the-counter or prescription medicines as directed by your caregiver.  Take your antibiotics as directed. Finish them even if you start to feel  better. SEEK MEDICAL CARE IF:   Your cyst becomes tender, red, or swollen.  Your condition is not improving or is getting worse.  You have any other questions or concerns. MAKE SURE YOU:  Understand these instructions.  Will watch your condition.  Will get help right away if you are not doing well or get worse. Document Released: 01/06/2004 Document Revised: 04/29/2011 Document Reviewed: 08/13/2010 Exit[employer] Patient Information 2014 [employer].  

## 2013-04-29 ENCOUNTER — Telehealth (INDEPENDENT_AMBULATORY_CARE_PROVIDER_SITE_OTHER): Payer: Self-pay

## 2013-04-29 NOTE — Telephone Encounter (Signed)
Pt called stating cyst on back of neck opened and drained this week. She states area has gone down a lot. No fever. No redness. Pt wants to keep surgery date. Pt states she wants to still have cyst removed so that it will not fill back up. I advised pt that it will make removal easier if it is not swollen. Pt advised if area becomes red or feverish she needs to call asap. Pt advised if area becomes infected it may change the surgical procedure or pt will need to be on antibiotics. Pt states she understands.

## 2013-05-06 ENCOUNTER — Other Ambulatory Visit (INDEPENDENT_AMBULATORY_CARE_PROVIDER_SITE_OTHER): Payer: Self-pay | Admitting: *Deleted

## 2013-05-06 ENCOUNTER — Other Ambulatory Visit (INDEPENDENT_AMBULATORY_CARE_PROVIDER_SITE_OTHER): Payer: Self-pay | Admitting: Surgery

## 2013-05-06 DIAGNOSIS — L723 Sebaceous cyst: Secondary | ICD-10-CM

## 2013-05-06 MED ORDER — HYDROCODONE-ACETAMINOPHEN 5-325 MG PO TABS: 1.0000 | ORAL_TABLET | ORAL | Status: DC | PRN

## 2013-05-11 ENCOUNTER — Telehealth (INDEPENDENT_AMBULATORY_CARE_PROVIDER_SITE_OTHER): Payer: Self-pay

## 2013-05-11 NOTE — Telephone Encounter (Signed)
LMOM for pt to return call. Pt can be given benign path and po appt date of 05-31-13 arrive at 10:45 am.

## 2013-05-31 ENCOUNTER — Encounter (INDEPENDENT_AMBULATORY_CARE_PROVIDER_SITE_OTHER): Payer: Self-pay | Admitting: Surgery

## 2013-05-31 ENCOUNTER — Ambulatory Visit (INDEPENDENT_AMBULATORY_CARE_PROVIDER_SITE_OTHER): Payer: Medicare Other | Admitting: Surgery

## 2013-05-31 VITALS — BP 150/84 | HR 71 | Temp 98.4°F | Resp 18 | Ht 64.0 in | Wt 168.6 lb

## 2013-05-31 DIAGNOSIS — R220 Localized swelling, mass and lump, head: Secondary | ICD-10-CM

## 2013-05-31 DIAGNOSIS — R221 Localized swelling, mass and lump, neck: Secondary | ICD-10-CM

## 2013-05-31 NOTE — Progress Notes (Signed)
General Surgery [facility]  Chief Complaint  Patient presents with  . Routine Post Op    excision cyst from neck on 05/06/2013    HISTORY: Patient is a 78 year old female who underwent excision of a small cyst from the right posterior neck on 05/06/2013. Final pathology is benign.  EXAM: Wound is healing nicely. No sign of infection. No sign of seroma. No tenderness.  IMPRESSION: Status post excision of cyst right posterior neck  PLAN: Patient will apply topical creams to her incision.  Patient will return for surgical care as needed.  Earnstine Regal, MD, [facility]   Visit Diagnoses: 1. Localized swelling, mass and lump, neck

## 2013-05-31 NOTE — Patient Instructions (Signed)
  CARE OF INCISION   Apply cocoa butter/vitamin E cream (Palmer's brand) to your incision 2 - 3 times daily.  Massage cream into incision for one minute with each application.  Use sunscreen (50 SPF or higher) for first 6 months after surgery if area is exposed to sun.  You may alternate Mederma or other scar reducing cream with cocoa butter cream if desired.       Anetha Slagel M. Arlena Marsan, MD, FACS      [facility]      Office: [phone]    

## 2013-06-17 ENCOUNTER — Ambulatory Visit
Admission: RE | Admit: 2013-06-17 | Discharge: 2013-06-17 | Disposition: A | Discharge status: Home / Self Care | Payer: 59 | Source: Ambulatory Visit | Attending: Nephrology | Admitting: Nephrology

## 2013-06-17 ENCOUNTER — Other Ambulatory Visit: Payer: Self-pay | Admitting: Nephrology

## 2013-06-17 DIAGNOSIS — R109 Unspecified abdominal pain: Secondary | ICD-10-CM

## 2013-06-18 ENCOUNTER — Other Ambulatory Visit: Payer: Self-pay | Admitting: Urology

## 2013-06-18 ENCOUNTER — Encounter (HOSPITAL_COMMUNITY): Payer: Self-pay | Admitting: Emergency Medicine

## 2013-06-18 ENCOUNTER — Encounter (HOSPITAL_COMMUNITY): Payer: Medicare Other | Admitting: Anesthesiology

## 2013-06-18 ENCOUNTER — Inpatient Hospital Stay: Admit: 2013-06-18 | Payer: Self-pay | Admitting: Urology

## 2013-06-18 ENCOUNTER — Encounter (HOSPITAL_COMMUNITY)
Admission: EM | Disposition: A | Discharge status: Home / Self Care | Payer: Self-pay | Source: Home / Self Care | Attending: Urology

## 2013-06-18 ENCOUNTER — Emergency Department (HOSPITAL_COMMUNITY): Payer: Medicare Other

## 2013-06-18 ENCOUNTER — Emergency Department (HOSPITAL_COMMUNITY): Payer: Medicare Other | Admitting: Anesthesiology

## 2013-06-18 ENCOUNTER — Inpatient Hospital Stay (HOSPITAL_COMMUNITY)
Admission: EM | Admit: 2013-06-18 | Discharge: 2013-06-22 | DRG: 871 | Disposition: A | Discharge status: Home / Self Care | Payer: Medicare Other | Attending: Urology | Admitting: Urology

## 2013-06-18 DIAGNOSIS — Z8249 Family history of ischemic heart disease and other diseases of the circulatory system: Secondary | ICD-10-CM

## 2013-06-18 DIAGNOSIS — E872 Acidosis, unspecified: Secondary | ICD-10-CM | POA: Diagnosis present

## 2013-06-18 DIAGNOSIS — N183 Chronic kidney disease, stage 3 unspecified: Secondary | ICD-10-CM

## 2013-06-18 DIAGNOSIS — N201 Calculus of ureter: Secondary | ICD-10-CM | POA: Diagnosis present

## 2013-06-18 DIAGNOSIS — E875 Hyperkalemia: Secondary | ICD-10-CM | POA: Diagnosis present

## 2013-06-18 DIAGNOSIS — N390 Urinary tract infection, site not specified: Secondary | ICD-10-CM

## 2013-06-18 DIAGNOSIS — R6520 Severe sepsis without septic shock: Secondary | ICD-10-CM

## 2013-06-18 DIAGNOSIS — M109 Gout, unspecified: Secondary | ICD-10-CM | POA: Diagnosis present

## 2013-06-18 DIAGNOSIS — G4733 Obstructive sleep apnea (adult) (pediatric): Secondary | ICD-10-CM | POA: Diagnosis present

## 2013-06-18 DIAGNOSIS — E213 Hyperparathyroidism, unspecified: Secondary | ICD-10-CM | POA: Diagnosis present

## 2013-06-18 DIAGNOSIS — N3289 Other specified disorders of bladder: Secondary | ICD-10-CM | POA: Diagnosis present

## 2013-06-18 DIAGNOSIS — J90 Pleural effusion, not elsewhere classified: Secondary | ICD-10-CM | POA: Diagnosis present

## 2013-06-18 DIAGNOSIS — Z87442 Personal history of urinary calculi: Secondary | ICD-10-CM

## 2013-06-18 DIAGNOSIS — N139 Obstructive and reflux uropathy, unspecified: Secondary | ICD-10-CM | POA: Diagnosis present

## 2013-06-18 DIAGNOSIS — I129 Hypertensive chronic kidney disease with stage 1 through stage 4 chronic kidney disease, or unspecified chronic kidney disease: Secondary | ICD-10-CM | POA: Diagnosis present

## 2013-06-18 DIAGNOSIS — Z800 Family history of malignant neoplasm of digestive organs: Secondary | ICD-10-CM

## 2013-06-18 DIAGNOSIS — Z808 Family history of malignant neoplasm of other organs or systems: Secondary | ICD-10-CM

## 2013-06-18 DIAGNOSIS — Z8673 Personal history of transient ischemic attack (TIA), and cerebral infarction without residual deficits: Secondary | ICD-10-CM

## 2013-06-18 DIAGNOSIS — Z823 Family history of stroke: Secondary | ICD-10-CM

## 2013-06-18 DIAGNOSIS — R6521 Severe sepsis with septic shock: Secondary | ICD-10-CM

## 2013-06-18 DIAGNOSIS — Z79899 Other long term (current) drug therapy: Secondary | ICD-10-CM

## 2013-06-18 DIAGNOSIS — A419 Sepsis, unspecified organism: Principal | ICD-10-CM | POA: Diagnosis present

## 2013-06-18 DIAGNOSIS — N179 Acute kidney failure, unspecified: Secondary | ICD-10-CM | POA: Diagnosis present

## 2013-06-18 HISTORY — DX: Personal history of urinary calculi: Z87.442

## 2013-06-18 HISTORY — PX: CYSTOSCOPY WITH URETEROSCOPY AND STENT PLACEMENT: SHX6377

## 2013-06-18 LAB — CBC WITH DIFFERENTIAL/PLATELET
BASOS ABS: 0 10*3/uL (ref 0.0–0.1)
BASOS ABS: 0 10*3/uL (ref 0.0–0.1)
Basophils Relative: 0 % (ref 0–1)
Basophils Relative: 0 % (ref 0–1)
EOS ABS: 0 10*3/uL (ref 0.0–0.7)
EOS PCT: 0 % (ref 0–5)
Eosinophils Absolute: 0 10*3/uL (ref 0.0–0.7)
Eosinophils Relative: 0 % (ref 0–5)
HCT: 30.1 % — ABNORMAL LOW (ref 36.0–46.0)
HEMATOCRIT: 33.5 % — AB (ref 36.0–46.0)
Hemoglobin: 10.9 g/dL — ABNORMAL LOW (ref 12.0–15.0)
Hemoglobin: 10 g/dL — ABNORMAL LOW (ref 12.0–15.0)
LYMPHS ABS: 0.9 10*3/uL (ref 0.7–4.0)
LYMPHS PCT: 3 % — AB (ref 12–46)
Lymphocytes Relative: 3 % — ABNORMAL LOW (ref 12–46)
Lymphs Abs: 0.8 10*3/uL (ref 0.7–4.0)
MCH: 29.7 pg (ref 26.0–34.0)
MCH: 30.5 pg (ref 26.0–34.0)
MCHC: 32.5 g/dL (ref 30.0–36.0)
MCHC: 33.2 g/dL (ref 30.0–36.0)
MCV: 91.3 fL (ref 78.0–100.0)
MCV: 91.8 fL (ref 78.0–100.0)
Monocytes Absolute: 0.9 10*3/uL (ref 0.1–1.0)
Monocytes Absolute: 0.7 10*3/uL (ref 0.1–1.0)
Monocytes Relative: 3 % (ref 3–12)
Monocytes Relative: 3 % (ref 3–12)
NEUTROS ABS: 24 10*3/uL — AB (ref 1.7–7.7)
Neutro Abs: 27 10*3/uL — ABNORMAL HIGH (ref 1.7–7.7)
Neutrophils Relative %: 94 % — ABNORMAL HIGH (ref 43–77)
Neutrophils Relative %: 94 % — ABNORMAL HIGH (ref 43–77)
Platelets: 216 10*3/uL (ref 150–400)
Platelets: 180 10*3/uL (ref 150–400)
RBC: 3.67 MIL/uL — ABNORMAL LOW (ref 3.87–5.11)
RBC: 3.28 MIL/uL — ABNORMAL LOW (ref 3.87–5.11)
RDW: 14.1 % (ref 11.5–15.5)
RDW: 14.4 % (ref 11.5–15.5)
WBC Morphology: INCREASED
WBC Morphology: INCREASED
WBC: 28.8 10*3/uL — ABNORMAL HIGH (ref 4.0–10.5)
WBC: 25.5 10*3/uL — ABNORMAL HIGH (ref 4.0–10.5)

## 2013-06-18 LAB — COMPREHENSIVE METABOLIC PANEL
ALBUMIN: 3 g/dL — AB (ref 3.5–5.2)
ALK PHOS: 24 U/L — AB (ref 39–117)
ALT: 21 U/L (ref 0–35)
ALT: 29 U/L (ref 0–35)
AST: 32 U/L (ref 0–37)
AST: 41 U/L — ABNORMAL HIGH (ref 0–37)
Albumin: 2.3 g/dL — ABNORMAL LOW (ref 3.5–5.2)
Alkaline Phosphatase: 26 U/L — ABNORMAL LOW (ref 39–117)
BILIRUBIN TOTAL: 0.6 mg/dL (ref 0.3–1.2)
BUN: 68 mg/dL — AB (ref 6–23)
BUN: 58 mg/dL — ABNORMAL HIGH (ref 6–23)
CALCIUM: 8.9 mg/dL (ref 8.4–10.5)
CHLORIDE: 108 meq/L (ref 96–112)
CO2: 18 mEq/L — ABNORMAL LOW (ref 19–32)
CO2: 17 mEq/L — ABNORMAL LOW (ref 19–32)
CREATININE: 4.55 mg/dL — AB (ref 0.50–1.10)
Calcium: 6.6 mg/dL — ABNORMAL LOW (ref 8.4–10.5)
Chloride: 97 mEq/L (ref 96–112)
Creatinine, Ser: 3.57 mg/dL — ABNORMAL HIGH (ref 0.50–1.10)
GFR calc Af Amer: 10 mL/min — ABNORMAL LOW (ref >90)
GFR calc non Af Amer: 9 mL/min — ABNORMAL LOW (ref >90)
GFR calc non Af Amer: 12 mL/min — ABNORMAL LOW (ref >90)
GFR, EST AFRICAN AMERICAN: 13 mL/min — AB (ref >90)
GLUCOSE: 130 mg/dL — AB (ref 70–99)
Glucose, Bld: 106 mg/dL — ABNORMAL HIGH (ref 70–99)
POTASSIUM: 5.4 meq/L — AB (ref 3.7–5.3)
Potassium: 4.3 mEq/L (ref 3.7–5.3)
Sodium: 137 mEq/L (ref 137–147)
Sodium: 140 mEq/L (ref 137–147)
TOTAL PROTEIN: 6.5 g/dL (ref 6.0–8.3)
TOTAL PROTEIN: 5.4 g/dL — AB (ref 6.0–8.3)
Total Bilirubin: 0.6 mg/dL (ref 0.3–1.2)

## 2013-06-18 LAB — URINALYSIS, ROUTINE W REFLEX MICROSCOPIC
Glucose, UA: NEGATIVE mg/dL
KETONES UR: NEGATIVE mg/dL
NITRITE: NEGATIVE
Protein, ur: 100 mg/dL — AB
SPECIFIC GRAVITY, URINE: 1.013 (ref 1.005–1.030)
Urobilinogen, UA: 1 mg/dL (ref 0.0–1.0)
pH: 5 (ref 5.0–8.0)

## 2013-06-18 LAB — PRO B NATRIURETIC PEPTIDE: Pro B Natriuretic peptide (BNP): 14523 pg/mL — ABNORMAL HIGH (ref 0–450)

## 2013-06-18 LAB — URINE MICROSCOPIC-ADD ON

## 2013-06-18 LAB — I-STAT CG4 LACTIC ACID, ED
LACTIC ACID, VENOUS: 3.98 mmol/L — AB (ref 0.5–2.2)
LACTIC ACID, VENOUS: 1.49 mmol/L (ref 0.5–2.2)

## 2013-06-18 LAB — BASIC METABOLIC PANEL
BUN: 62 mg/dL — AB (ref 6–23)
CO2: 16 meq/L — AB (ref 19–32)
CREATININE: 3.89 mg/dL — AB (ref 0.50–1.10)
Calcium: 7.4 mg/dL — ABNORMAL LOW (ref 8.4–10.5)
Chloride: 102 mEq/L (ref 96–112)
GFR calc non Af Amer: 10 mL/min — ABNORMAL LOW (ref >90)
GFR, EST AFRICAN AMERICAN: 12 mL/min — AB (ref >90)
Glucose, Bld: 114 mg/dL — ABNORMAL HIGH (ref 70–99)
Potassium: 4.6 mEq/L (ref 3.7–5.3)
Sodium: 140 mEq/L (ref 137–147)

## 2013-06-18 LAB — TROPONIN I: Troponin I: <0.3 ng/mL (ref <0.30)

## 2013-06-18 LAB — LACTIC ACID, PLASMA: LACTIC ACID, VENOUS: 3.5 mmol/L — AB (ref 0.5–2.2)

## 2013-06-18 LAB — CANCER ANTIGEN 19-9: CA 19-9: 15.6 U/mL — ABNORMAL LOW (ref <35.0)

## 2013-06-18 LAB — CORTISOL: Cortisol, Plasma: 52.5 ug/dL

## 2013-06-18 LAB — POC OCCULT BLOOD, ED: Fecal Occult Bld: NEGATIVE

## 2013-06-18 LAB — TSH: TSH: 2.69 u[IU]/mL (ref 0.350–4.500)

## 2013-06-18 LAB — LIPASE, BLOOD: LIPASE: 35 U/L (ref 11–59)

## 2013-06-18 SURGERY — CYSTOURETEROSCOPY, WITH STENT INSERTION
Anesthesia: General | Site: Ureter | Laterality: Left

## 2013-06-18 MED ORDER — ATORVASTATIN CALCIUM 10 MG PO TABS
10.0000 mg | ORAL_TABLET | Freq: Every day | ORAL | Status: DC
  Administered 2013-06-18 – 2013-06-21 (×4): 10 mg via ORAL
  Filled 2013-06-18 (×5): qty 1

## 2013-06-18 MED ORDER — 0.9 % SODIUM CHLORIDE (POUR BTL) OPTIME
TOPICAL | Status: DC | PRN
  Administered 2013-06-18: 1000 mL

## 2013-06-18 MED ORDER — LIDOCAINE HCL 2 % EX GEL
CUTANEOUS | Status: AC
  Filled 2013-06-18: qty 10

## 2013-06-18 MED ORDER — SODIUM CHLORIDE 0.9 % IV BOLUS (SEPSIS)
1000.0000 mL | Freq: Once | INTRAVENOUS | Status: AC
  Administered 2013-06-18: 1000 mL via INTRAVENOUS

## 2013-06-18 MED ORDER — DEXTROSE 5 % IV SOLN
1.0000 g | Freq: Once | INTRAVENOUS | Status: AC
  Administered 2013-06-18: 1 g via INTRAVENOUS
  Filled 2013-06-18: qty 10

## 2013-06-18 MED ORDER — DEXAMETHASONE SODIUM PHOSPHATE 10 MG/ML IJ SOLN
INTRAMUSCULAR | Status: AC
  Filled 2013-06-18: qty 1

## 2013-06-18 MED ORDER — INSULIN ASPART 100 UNIT/ML ~~LOC~~ SOLN
0.0000 [IU] | SUBCUTANEOUS | Status: DC
  Administered 2013-06-19 (×3): 1 [IU] via SUBCUTANEOUS

## 2013-06-18 MED ORDER — DEXAMETHASONE SODIUM PHOSPHATE 10 MG/ML IJ SOLN
INTRAMUSCULAR | Status: DC | PRN
  Administered 2013-06-18: 10 mg via INTRAVENOUS

## 2013-06-18 MED ORDER — FENTANYL CITRATE 0.05 MG/ML IJ SOLN
25.0000 ug | INTRAMUSCULAR | Status: DC | PRN
  Administered 2013-06-18: 12.5 ug via INTRAVENOUS

## 2013-06-18 MED ORDER — MEPERIDINE HCL 50 MG/ML IJ SOLN: 6.2500 mg | INTRAMUSCULAR | Status: DC | PRN

## 2013-06-18 MED ORDER — DEXTROSE 5 % IV SOLN
1.0000 g | INTRAVENOUS | Status: DC
  Administered 2013-06-19: 1 g via INTRAVENOUS
  Filled 2013-06-18 (×2): qty 10

## 2013-06-18 MED ORDER — PANTOPRAZOLE SODIUM 40 MG IV SOLR
40.0000 mg | INTRAVENOUS | Status: DC
  Administered 2013-06-18 – 2013-06-19 (×2): 40 mg via INTRAVENOUS
  Filled 2013-06-18 (×3): qty 40

## 2013-06-18 MED ORDER — PROMETHAZINE HCL 25 MG/ML IJ SOLN: 6.2500 mg | INTRAMUSCULAR | Status: DC | PRN

## 2013-06-18 MED ORDER — PHENYLEPHRINE 40 MCG/ML (10ML) SYRINGE FOR IV PUSH (FOR BLOOD PRESSURE SUPPORT)
PREFILLED_SYRINGE | INTRAVENOUS | Status: AC
  Filled 2013-06-18: qty 10

## 2013-06-18 MED ORDER — PROPOFOL 10 MG/ML IV BOLUS
INTRAVENOUS | Status: DC | PRN
  Administered 2013-06-18: 100 mg via INTRAVENOUS

## 2013-06-18 MED ORDER — SODIUM CHLORIDE 0.9 % IV SOLN
1500.0000 mg | Freq: Once | INTRAVENOUS | Status: AC
  Administered 2013-06-18: 1500 mg via INTRAVENOUS
  Filled 2013-06-18: qty 1500

## 2013-06-18 MED ORDER — CINACALCET HCL 30 MG PO TABS
30.0000 mg | ORAL_TABLET | Freq: Every day | ORAL | Status: DC
  Administered 2013-06-19 – 2013-06-21 (×3): 30 mg via ORAL
  Filled 2013-06-18 (×4): qty 1

## 2013-06-18 MED ORDER — CALCITRIOL 0.25 MCG PO CAPS
0.2500 ug | ORAL_CAPSULE | ORAL | Status: DC
  Administered 2013-06-18 – 2013-06-21 (×2): 0.25 ug via ORAL
  Filled 2013-06-18 (×3): qty 1

## 2013-06-18 MED ORDER — PROPOFOL 10 MG/ML IV BOLUS
INTRAVENOUS | Status: AC
  Filled 2013-06-18: qty 20

## 2013-06-18 MED ORDER — DEXTROSE 5 % IV SOLN: 1.0000 g | INTRAVENOUS | Status: DC

## 2013-06-18 MED ORDER — SODIUM CHLORIDE 0.9 % IV SOLN
1000.0000 mL | INTRAVENOUS | Status: DC
  Administered 2013-06-18: 17:00:00 via INTRAVENOUS
  Administered 2013-06-19: 1000 mL via INTRAVENOUS

## 2013-06-18 MED ORDER — ONDANSETRON HCL 4 MG/2ML IJ SOLN
INTRAMUSCULAR | Status: DC | PRN
  Administered 2013-06-18: 4 mg via INTRAVENOUS

## 2013-06-18 MED ORDER — SUCCINYLCHOLINE CHLORIDE 20 MG/ML IJ SOLN
INTRAMUSCULAR | Status: DC | PRN
Start: 2013-06-18 — End: 2013-06-18
  Administered 2013-06-18: 100 mg via INTRAVENOUS

## 2013-06-18 MED ORDER — SODIUM CHLORIDE 0.9 % IV SOLN
INTRAVENOUS | Status: DC
  Administered 2013-06-18 (×4): via INTRAVENOUS

## 2013-06-18 MED ORDER — FENTANYL CITRATE 0.05 MG/ML IJ SOLN
INTRAMUSCULAR | Status: AC
  Filled 2013-06-18: qty 2

## 2013-06-18 MED ORDER — PHENYLEPHRINE HCL 10 MG/ML IJ SOLN
INTRAMUSCULAR | Status: DC | PRN
  Administered 2013-06-18: 40 ug via INTRAVENOUS
  Administered 2013-06-18 (×3): 120 ug via INTRAVENOUS

## 2013-06-18 MED ORDER — METOPROLOL SUCCINATE ER 100 MG PO TB24
100.0000 mg | ORAL_TABLET | Freq: Every day | ORAL | Status: DC
  Administered 2013-06-21 – 2013-06-22 (×2): 100 mg via ORAL
  Filled 2013-06-18 (×5): qty 1

## 2013-06-18 MED ORDER — ONDANSETRON HCL 4 MG/2ML IJ SOLN
INTRAMUSCULAR | Status: AC
  Filled 2013-06-18: qty 2

## 2013-06-18 MED ORDER — VANCOMYCIN HCL IN DEXTROSE 1-5 GM/200ML-% IV SOLN: 1000.0000 mg | Freq: Once | INTRAVENOUS | Status: DC

## 2013-06-18 MED ORDER — SODIUM CHLORIDE 0.9 % IV SOLN
1000.0000 mL | Freq: Once | INTRAVENOUS | Status: AC
  Administered 2013-06-18: 1000 mL via INTRAVENOUS

## 2013-06-18 MED ORDER — ONDANSETRON HCL 4 MG/2ML IJ SOLN
4.0000 mg | Freq: Once | INTRAMUSCULAR | Status: DC
Start: 2013-06-18 — End: 2013-06-20
  Filled 2013-06-18: qty 2

## 2013-06-18 MED ORDER — ENOXAPARIN SODIUM 30 MG/0.3ML ~~LOC~~ SOLN
30.0000 mg | SUBCUTANEOUS | Status: DC
  Administered 2013-06-18 – 2013-06-21 (×4): 30 mg via SUBCUTANEOUS
  Filled 2013-06-18 (×5): qty 0.3

## 2013-06-18 SURGICAL SUPPLY — 16 items
BAG URO CATCHER STRL LF (DRAPE) ×2 IMPLANT
BASKET ZERO TIP NITINOL 2.4FR (BASKET) IMPLANT
CATH FOLEY 2WAY SLVR  5CC 16FR (CATHETERS) ×1
CATH FOLEY 2WAY SLVR 5CC 16FR (CATHETERS) ×1 IMPLANT
CATH INTERMIT  6FR 70CM (CATHETERS) IMPLANT
CLOTH BEACON ORANGE TIMEOUT ST (SAFETY) ×2 IMPLANT
DRAPE CAMERA CLOSED 9X96 (DRAPES) ×2 IMPLANT
GLOVE BIOGEL M STRL SZ7.5 (GLOVE) ×2 IMPLANT
GOWN STRL REUS W/TWL LRG LVL3 (GOWN DISPOSABLE) ×4 IMPLANT
GUIDEWIRE ANG ZIPWIRE 038X150 (WIRE) IMPLANT
GUIDEWIRE STR DUAL SENSOR (WIRE) ×2 IMPLANT
MANIFOLD NEPTUNE II (INSTRUMENTS) ×2 IMPLANT
PACK CYSTO (CUSTOM PROCEDURE TRAY) ×2 IMPLANT
STENT CONTOUR 6FRX24X.038 (STENTS) ×2 IMPLANT
SYR 30ML LL (SYRINGE) ×2 IMPLANT
TUBING CONNECTING 10 (TUBING) ×2 IMPLANT

## 2013-06-18 NOTE — Transfer of Care (Signed)
Immediate Anesthesia Transfer of Care Note  Patient: Kim Burns  Procedure(s) Performed: Procedure(s): CYSTOSCOPY WITH Lef URETEROSCOPY AND Left STENT PLACEMENT (Left)  Patient Location: PACU  Anesthesia Type:General  Level of Consciousness: awake and patient cooperative  Airway & Oxygen Therapy: Patient Spontanous Breathing and Patient connected to face mask oxygen  Post-op Assessment: Report given to PACU RN and Post -op Vital signs reviewed and stable  Post vital signs: Reviewed and stable  Complications: No apparent anesthesia complications

## 2013-06-18 NOTE — Op Note (Signed)
Preoperative diagnosis:  1. Left ureteral stone 2. Sepsis   Postoperative diagnosis:  1. Left ureteral stone 2. Sepsis   Procedure:  1. Cystoscopy 2. Left ureteral stent placement (6 x 24)  3. Left retrograde pyelography with interpretation   Surgeon: Pryor Curia. M.D.  Anesthesia: General  Complications: None  Intraoperative findings: Left RPG demonstrated a filling defect in the proximal left ureter consistent with her known 8 mm proximal left ureteral stone.  EBL: Minimal  Specimens: None  Indication: Kim Burns is a 78 y.o. patient with an 8 mm left ureteral stone and sepsis. After reviewing the management options for treatment, he elected to proceed with the above surgical procedure(s). We have discussed the potential benefits and risks of the procedure, side effects of the proposed treatment, the likelihood of the patient achieving the goals of the procedure, and any potential problems that might occur during the procedure or recuperation. Informed consent has been obtained.  Description of procedure:  The patient was taken to the operating room and general anesthesia was induced.  The patient was placed in the dorsal lithotomy position, prepped and draped in the usual sterile fashion, and preoperative antibiotics were administered. A preoperative time-out was performed.   Cystourethroscopy was performed.  The patient's urethra was examined and was normal. The bladder was then systematically examined in its entirety. There was no evidence for any bladder tumors, stones, or other mucosal pathology.  The urine was cloudy consistent with infected urine.  Attention then turned to the left ureteral orifice and a ureteral catheter was used to intubate the ureteral orifice.  Omnipaque contrast was injected through the ureteral catheter and a retrograde pyelogram was performed with findings as dictated above.  A 0.38 sensor guidewire was then advanced up the left  ureter into the renal pelvis under fluoroscopic guidance.  The wire was then backloaded through the cystoscope and a ureteral stent was advance over the wire using Seldinger technique.  The stent was positioned appropriately under fluoroscopic and cystoscopic guidance.  The wire was then removed with an adequate stent curl noted in the renal pelvis as well as in the bladder.  The bladder was then emptied and the procedure ended.  The patient appeared to tolerate the procedure well and without complications.  The patient was able to be awakened and transferred to the recovery unit in satisfactory condition.    Pryor Curia MD

## 2013-06-18 NOTE — Progress Notes (Signed)
ANTIBIOTIC CONSULT NOTE - INITIAL  Pharmacy Consult for Vancomycin Indication: rule out sepsis  Allergies  Allergen Reactions  . Codeine Other (See Comments)    headache  . Statins Other (See Comments)    Muscle weakness   Patient Measurements:   Total Body Weight: 76.5 kg  Vital Signs: Temp: 97.8 F (36.6 C) (05/01 1356) Temp src: Oral (05/01 1356) BP: 107/74 mmHg (05/01 1600) Pulse Rate: 84 (05/01 1500) Intake/Output from previous day:   Intake/Output from this shift:    Labs:  Recent Labs  06/18/13 1435  WBC 28.8*  HGB 10.9*  PLT 216  CREATININE 4.55*   The CrCl is unknown because both a height and weight (above a minimum accepted value) are required for this calculation. No results found for this basename: VANCOTROUGH, VANCOPEAK, VANCORANDOM, GENTTROUGH, GENTPEAK, GENTRANDOM, TOBRATROUGH, TOBRAPEAK, TOBRARND, AMIKACINPEAK, AMIKACINTROU, AMIKACIN,  in the last 72 hours   Microbiology: No results found for this or any previous visit (from the past 720 hour(s)).  Medical History: Past Medical History  Diagnosis Date  . Hypertension   . Thyroid disease   . CKD (chronic kidney disease) stage 3, GFR 30-59 ml/min   . Bradycardia   . TIA (transient ischemic attack)   . Anemia of chronic disease   . Gout   . Hyperparathyroidism   . Sleep apnea    Medications:  Anti-infectives   Start     Dose/Rate Route Frequency Ordered Stop   06/18/13 1630  cefTRIAXone (ROCEPHIN) 1 g in dextrose 5 % 50 mL IVPB     1 g 100 mL/hr over 30 Minutes Intravenous Every 24 hours 06/18/13 1621     06/18/13 1500  cefTRIAXone (ROCEPHIN) 1 g in dextrose 5 % 50 mL IVPB     1 g 100 mL/hr over 30 Minutes Intravenous  Once 06/18/13 1447 06/18/13 1559     Assessment: 59 yoF with onset of severe flank pain, L renal stone per CT. Hx of CKD (baseline Cr noted ~ 2.2), admit SCr 4.55 gm/dl. Vancomycin per pharmacy, rule out sepsis, Rocephin 1gm daily per MD  Clearance ~ 12 ml/min, will  give Vancomycin 1500mg  IV x 1 dose, follow SCr for next dose. Calculates to about q48hr schedule with current clearance  Goal of Therapy:  Vancomycin trough level 15-20 mcg/ml  Plan:   Vancomycin 1500mg  x1 dose, follow SCr for subsequent doses  Rocephin 1gm q24, not renally excreted, no adjustment necessary  Minda Ditto PharmD Pager [phone] 06/18/2013, 4:49 PM

## 2013-06-18 NOTE — ED Provider Notes (Signed)
CSN: [id number]     Arrival date & time 06/18/13  1348 History   First MD Initiated Contact with Patient 06/18/13 1408     Chief Complaint  Patient presents with  . Hypotension  . Nephrolithiasis     (Consider location/radiation/quality/duration/timing/severity/associated sxs/prior Treatment) HPI Comments: Patient sent from urology office with hypotension and back pain. She developed left-sided back pain yesterday and had an outpatient CT scan shows a large 8 mm proximal stone. She was referred to Dr. Alinda Money today. She was found to be hypotensive in the office with a blood pressure of 72/47. She was given IV fluids and IV Rocephin. Patient arrives to the ED with blood pressure of 94 systolic. She denies any chest pain left-sided abdominal pain and low back pain. He endorses a history of kidney stone x2 previously. CT scan they also showed a mass in the pancreas likely adenoma.   The history is provided by the patient.    Past Medical History  Diagnosis Date  . Hypertension   . Thyroid disease   . CKD (chronic kidney disease) stage 3, GFR 30-59 ml/min   . Bradycardia   . TIA (transient ischemic attack)   . Anemia of chronic disease   . Gout   . Hyperparathyroidism   . Sleep apnea    Past Surgical History  Procedure Laterality Date  . Cholecystectomy    . Abdominal hysterectomy    . Back surgery    . Breast lumpectomy    . Parathyroid exploration     Family History  Problem Relation Age of Onset  . Heart disease Mother   . Stroke Mother   . Cancer Father     colon  . Cancer Brother     brain   History  Substance Use Topics  . Smoking status: Never Smoker   . Smokeless tobacco: Never Used  . Alcohol Use: No   OB History   Grav Para Term Preterm Abortions TAB SAB Ect Mult Living                 Review of Systems  Constitutional: Positive for activity change, appetite change and fatigue. Negative for fever.  Respiratory: Negative for choking and shortness of  breath.   Cardiovascular: Negative for chest pain.  Gastrointestinal: Positive for nausea. Negative for vomiting and abdominal pain.  Genitourinary: Positive for dysuria.  Musculoskeletal: Positive for back pain. Negative for arthralgias and myalgias.  Skin: Negative for rash.  Neurological: Positive for weakness.  A complete 10 system review of systems was obtained and all systems are negative except as noted in the HPI and PMH.      Allergies  Codeine and Statins  Home Medications   Prior to Admission medications   Medication Sig Start Date End Date Taking? Authorizing Provider  atorvastatin (LIPITOR) 10 MG tablet Take 10 mg by mouth daily.   Yes Historical Provider, MD  calcitRIOL (ROCALTROL) 0.25 MCG capsule Take 0.25 mcg by mouth 3 (three) times a week. Take 1 capsule every Monday, Wednesday and Friday   Yes Historical Provider, MD  cinacalcet (SENSIPAR) 30 MG tablet Take 30 mg by mouth daily with supper.   Yes Historical Provider, MD  ciprofloxacin (CIPRO) 250 MG tablet Take 250 mg by mouth 2 (two) times daily. Filled on 06/17/13 for 14 pills   Yes Historical Provider, MD  cloNIDine (CATAPRES) 0.1 MG tablet Take 0.1 mg by mouth at bedtime.   Yes Historical Provider, MD  clopidogrel (PLAVIX) 75 MG tablet  Take 75 mg by mouth every morning.   Yes Historical Provider, MD  diphenhydramine-acetaminophen (TYLENOL PM) 25-500 MG TABS Take 1 tablet by mouth at bedtime.   Yes Historical Provider, MD  Ergocalciferol (VITAMIN D2 PO) Take 1.25 mg by mouth once a week. 1.25 mg = 50,000 units. Patient takes on Wednesdays   Yes Historical Provider, MD  Febuxostat (ULORIC) 80 MG TABS Take 80 mg by mouth daily.    Yes Historical Provider, MD  fenofibrate (TRICOR) 145 MG tablet Take 145 mg by mouth daily.   Yes Historical Provider, MD  furosemide (LASIX) 20 MG tablet Take 20 mg by mouth every morning.   Yes Historical Provider, MD  metoprolol succinate (TOPROL-XL) 100 MG 24 hr tablet Take 100 mg by  mouth daily. Take with or immediately following a meal.   Yes Historical Provider, MD  Omega-3 Fatty Acids (FISH OIL PO) Take 3,000 mg by mouth daily.   Yes Historical Provider, MD  oxycodone (OXY-IR) 5 MG capsule Take 5 mg by mouth every 6 (six) hours as needed for pain.   Yes Historical Provider, MD  telmisartan (MICARDIS) 80 MG tablet Take 80 mg by mouth at bedtime.    Yes Historical Provider, MD   BP 103/61  Pulse 93  Temp(Src) 98.7 F (37.1 C) (Oral)  Resp 30  SpO2 99% Physical Exam  Constitutional: She is oriented to person, place, and time. She appears well-developed. No distress.  Uncomfortable, pale, extremely dry mucous membranes  HENT:  Head: Normocephalic and atraumatic.  Mouth/Throat: Oropharynx is clear and moist. No oropharyngeal exudate.  Eyes: Conjunctivae are normal. Pupils are equal, round, and reactive to light.  Neck: Normal range of motion. Neck supple.  Cardiovascular: Normal rate and normal heart sounds.   No murmur heard. Pulmonary/Chest: Breath sounds normal. No respiratory distress. She has no wheezes.  Abdominal: Soft. There is tenderness. There is no rebound and no guarding.  Left-sided abdominal pain without peritoneal signs.  Musculoskeletal: Normal range of motion. She exhibits tenderness.  Left CVA tenderness  Neurological: She is alert and oriented to person, place, and time. No cranial nerve deficit. She exhibits normal muscle tone. Coordination normal.  Skin: Skin is warm.    ED Course  Procedures (including critical care time) Labs Review Labs Reviewed  CBC WITH DIFFERENTIAL - Abnormal; Notable for the following:    WBC 28.8 (*)    RBC 3.67 (*)    Hemoglobin 10.9 (*)    HCT 33.5 (*)    Neutrophils Relative % 94 (*)    Lymphocytes Relative 3 (*)    Neutro Abs 27.0 (*)    All other components within normal limits  COMPREHENSIVE METABOLIC PANEL - Abnormal; Notable for the following:    CO2 18 (*)    Glucose, Bld 106 (*)    BUN 68 (*)     Creatinine, Ser 4.55 (*)    Albumin 3.0 (*)    Alkaline Phosphatase 26 (*)    GFR calc non Af Amer 9 (*)    GFR calc Af Amer 10 (*)    All other components within normal limits  URINALYSIS, ROUTINE W REFLEX MICROSCOPIC - Abnormal; Notable for the following:    Color, Urine RED (*)    APPearance TURBID (*)    Hgb urine dipstick LARGE (*)    Bilirubin Urine MODERATE (*)    Protein, ur 100 (*)    Leukocytes, UA LARGE (*)    All other components within normal limits  LACTIC  ACID, PLASMA - Abnormal; Notable for the following:    Lactic Acid, Venous 3.5 (*)    All other components within normal limits  I-STAT CG4 LACTIC ACID, ED - Abnormal; Notable for the following:    Lactic Acid, Venous 3.98 (*)    All other components within normal limits  URINE CULTURE  CULTURE, BLOOD (ROUTINE X 2)  CULTURE, BLOOD (ROUTINE X 2)  LIPASE, BLOOD  TROPONIN I  BASIC METABOLIC PANEL  URINE MICROSCOPIC-ADD ON  CANCER ANTIGEN [id number]  BASIC METABOLIC PANEL  CORTISOL  TSH  COMPREHENSIVE METABOLIC PANEL  CBC WITH DIFFERENTIAL  PRO B NATRIURETIC PEPTIDE  POC OCCULT BLOOD, ED  I-STAT CG4 LACTIC ACID, ED    Imaging Review Ct Abdomen Pelvis Wo Contrast  06/17/2013   CLINICAL DATA:  Left lower quadrant left flank pain.  Hematuria.  EXAM: CT ABDOMEN AND PELVIS WITHOUT CONTRAST  TECHNIQUE: Multidetector CT imaging of the abdomen and pelvis was performed following the standard protocol without intravenous contrast.  COMPARISON:  None.  FINDINGS: There is an 8 mm stone obstructing the proximal left ureter 4 cm below the renal pelvis. This is creating slight left hydronephrosis with slight perinephric soft tissue stranding. There are no other ureteral or renal calculi on the right or left.  Bladder appears normal.  There is a 3.9 cm multi-septated cystic neoplasm in the pancreatic head, most likely a benign serous cystic adenoma. There is no dilatation of the pancreatic duct or of the common bile duct. Gallbladder  has been removed.  Liver, spleen and adrenal glands are normal.  The patient has numerous cysts in both kidneys. There is a 9 mm hyperdense cyst on the lateral aspect of the mid portion of the right kidney.  The uterus and ovaries have been removed. There are scattered diverticula in the distal colon. There is a 2 cm umbilical hernia containing only fat. No acute osseous abnormalities.  IMPRESSION: 1. 8 mm stone obstructing the proximal left ureter. 2. 3.9 cm cystic neoplasm of the pancreatic head, most likely a benign serous cystic adenoma.   Electronically Signed   By: Rozetta Nunnery M.D.   On: 06/17/2013 18:43   Dg Chest Portable 1 View  06/18/2013   CLINICAL DATA:  Shortness of breath.  EXAM: PORTABLE CHEST - 1 VIEW  COMPARISON:  DG CHEST 2 VIEW dated 07/28/2011  FINDINGS: Mediastinum hilar structures are normal. Poor inspiratory effort. Interim complete clearing of basilar atelectasis. Heart size normal. No acute bony abnormality. Surgical clips right neck.  IMPRESSION: Interim near complete clearing of basilar atelectasis.   Electronically Signed   By: Marcello Moores  Register   On: 06/18/2013 15:04     EKG Interpretation None      MDM   Final diagnoses:  Sepsis  Urinary tract infection  Ureterolithiasis   Proximal ureteral stone with hypotension and likely sepsis. Patient is afebrile. She is hypotensive with an infected-looking UA. She will be given IV fluids, IV antibiotics, cultures obtained.  Urosepsis from infected kidney stone. Hypotensive in the 70s urology office. She started IV fluids, broad spectrum antibiotics, cultures obtained. Rocephin and vancomycin given.  After 2 L of fluid, blood pressure has improved to 94 systolic. Lactate is 4. Renal failure with creatinine 4.5 from baseline of 2.  BP responding to fluids. After 3L, SBP is 104.  HR 80s.  D/w Dr. Alinda Money who plans to take patient to OR for stent at 5pm. He agrees with medical/critical care assistance for patient's ongoing  sepsis.  D/w Dr. Titus Mould who agrees with BP responsive to fluids, no role for CVL or pressors currently. PCCM will follow patient. To OR with Dr. Alinda Money.  CRITICAL CARE Performed by: Ezequiel Essex Total critical care time: 35 Critical care time was exclusive of separately billable procedures and treating other patients. Critical care was necessary to treat or prevent imminent or life-threatening deterioration. Critical care was time spent personally by me on the following activities: development of treatment plan with patient and/or surrogate as well as nursing, discussions with consultants, evaluation of patient's response to treatment, examination of patient, obtaining history from patient or surrogate, ordering and performing treatments and interventions, ordering and review of laboratory studies, ordering and review of radiographic studies, pulse oximetry and re-evaluation of patient's condition.   Ezequiel Essex, MD 06/18/13 [phone]

## 2013-06-18 NOTE — Anesthesia Preprocedure Evaluation (Signed)
Anesthesia Evaluation  Patient identified by MRN, date of birth, ID band Patient awake    Reviewed: Allergy & Precautions, H&P , NPO status , Patient's Chart, lab work & pertinent test results  Airway Mallampati: II TM Distance: >3 FB Neck ROM: Full    Dental no notable dental hx.    Pulmonary sleep apnea ,  breath sounds clear to auscultation  Pulmonary exam normal       Cardiovascular hypertension, Pt. on medications Rhythm:Regular Rate:Normal     Neuro/Psych TIAnegative psych ROS   GI/Hepatic negative GI ROS, Neg liver ROS,   Endo/Other  negative endocrine ROS  Renal/GU CRFRenal disease  negative genitourinary   Musculoskeletal negative musculoskeletal ROS (+)   Abdominal   Peds negative pediatric ROS (+)  Hematology negative hematology ROS (+)   Anesthesia Other Findings   Reproductive/Obstetrics negative OB ROS                           Anesthesia Physical Anesthesia Plan  ASA: II  Anesthesia Plan: General   Post-op Pain Management:    Induction: Intravenous  Airway Management Planned: Oral ETT  Additional Equipment:   Intra-op Plan:   Post-operative Plan: Extubation in OR  Informed Consent: I have reviewed the patients History and Physical, chart, labs and discussed the procedure including the risks, benefits and alternatives for the proposed anesthesia with the patient or authorized representative who has indicated his/her understanding and acceptance.   Dental advisory given  Plan Discussed with: CRNA  Anesthesia Plan Comments:         Anesthesia Quick Evaluation

## 2013-06-18 NOTE — Consult Note (Signed)
PULMONARY / CRITICAL CARE MEDICINE   Name: Kim Burns MRN: [id number] DOB: 03-Feb-1936    ADMISSION DATE:  06/18/2013 CONSULTATION DATE:  06/18/13  REFERRING MD :  Urology  PRIMARY SERVICE: Urology   CHIEF COMPLAINT:  Occult septic shock, stone  BRIEF PATIENT DESCRIPTION: 78 yr old left stone, occult septic shock  SIGNIFICANT EVENTS / STUDIES:  4/30 CT >>>8 mm stone obstructing the proximal left ureter.<BR>2. 3.9 cm cystic neoplasm of the pancreatic head, most likely a<BR>benign serous cystic adenoma  LINES / TUBES: piv  CULTURES: 5/1 BC>>> 5/1 urine>>>  ANTIBIOTICS: Ceftriaxone 5/1>>> vanc 5/1>>>  HISTORY OF PRESENT ILLNESS:  [name] is a 78 year old female . She has a history of chronic kidney disease thought to be related to hypertensive nephrosclerosis versus chronic interstitial nephritis with a baseline creatinine 2.2.  She developed acute onset of severe left-sided abdominal pain. She underwent a CT scan which demonstrated an 8 mm proximal left ureteral calculus. There was no significant hydronephrosis noted. She did have significant perinephric left renal stranding. Reported to urology office and declined, sent to ED [facility]. IN ED with lactic acidosis, shock responding to volume thus far. Called to assist. sys increasing after 3 liters.  PAST MEDICAL HISTORY :  Past Medical History  Diagnosis Date  . Hypertension   . Thyroid disease   . CKD (chronic kidney disease) stage 3, GFR 30-59 ml/min   . Bradycardia   . TIA (transient ischemic attack)   . Anemia of chronic disease   . Gout   . Hyperparathyroidism   . Sleep apnea    Past Surgical History  Procedure Laterality Date  . Cholecystectomy    . Abdominal hysterectomy    . Back surgery    . Breast lumpectomy    . Parathyroid exploration     Prior to Admission medications   Medication Sig Start Date End Date Taking? Authorizing Provider  atorvastatin (LIPITOR) 10 MG tablet Take 10 mg by mouth daily.   Yes  Historical Provider, MD  calcitRIOL (ROCALTROL) 0.25 MCG capsule Take 0.25 mcg by mouth 3 (three) times a week. Take 1 capsule every Monday, Wednesday and Friday   Yes Historical Provider, MD  cinacalcet (SENSIPAR) 30 MG tablet Take 30 mg by mouth daily with supper.   Yes Historical Provider, MD  ciprofloxacin (CIPRO) 250 MG tablet Take 250 mg by mouth 2 (two) times daily. Filled on 06/17/13 for 14 pills   Yes Historical Provider, MD  cloNIDine (CATAPRES) 0.1 MG tablet Take 0.1 mg by mouth at bedtime.   Yes Historical Provider, MD  clopidogrel (PLAVIX) 75 MG tablet Take 75 mg by mouth every morning.   Yes Historical Provider, MD  diphenhydramine-acetaminophen (TYLENOL PM) 25-500 MG TABS Take 1 tablet by mouth at bedtime.   Yes Historical Provider, MD  Ergocalciferol (VITAMIN D2 PO) Take 1.25 mg by mouth once a week. 1.25 mg = 50,000 units. Patient takes on Wednesdays   Yes Historical Provider, MD  Febuxostat (ULORIC) 80 MG TABS Take 80 mg by mouth daily.    Yes Historical Provider, MD  fenofibrate (TRICOR) 145 MG tablet Take 145 mg by mouth daily.   Yes Historical Provider, MD  furosemide (LASIX) 20 MG tablet Take 20 mg by mouth every morning.   Yes Historical Provider, MD  metoprolol succinate (TOPROL-XL) 100 MG 24 hr tablet Take 100 mg by mouth daily. Take with or immediately following a meal.   Yes Historical Provider, MD  Omega-3 Fatty Acids (FISH OIL PO)  Take 3,000 mg by mouth daily.   Yes Historical Provider, MD  oxycodone (OXY-IR) 5 MG capsule Take 5 mg by mouth every 6 (six) hours as needed for pain.   Yes Historical Provider, MD  telmisartan (MICARDIS) 80 MG tablet Take 80 mg by mouth at bedtime.    Yes Historical Provider, MD   Allergies  Allergen Reactions  . Codeine Other (See Comments)    headache  . Statins Other (See Comments)    Muscle weakness    FAMILY HISTORY:  Family History  Problem Relation Age of Onset  . Heart disease Mother   . Stroke Mother   . Cancer Father      colon  . Cancer Brother     brain   SOCIAL HISTORY:  reports that she has never smoked. She has never used smokeless tobacco. She reports that she does not drink alcohol or use illicit drugs.  REVIEW OF SYSTEMS: reviewed, no CP, no n/v Does have mild headache , no leg pain  SUBJECTIVE:  abdo pain reported  VITAL SIGNS: Temp:  [97.8 F (36.6 C)] 97.8 F (36.6 C) (05/01 1356) Pulse Rate:  [84-89] 84 (05/01 1500) Resp:  [16-22] 22 (05/01 1530) BP: (88-97)/(47-59) 97/59 mmHg (05/01 1530) SpO2:  [96 %] 96 % (05/01 1500) HEMODYNAMICS:   VENTILATOR SETTINGS:   INTAKE / OUTPUT: Intake/Output   None     PHYSICAL EXAMINATION: General:  No distress Neuro:  Awake,nonfocal, O x 4 HEENT:  Obese, no jvd Cardiovascular:  s1 s2 RRr no r Lungs:  CTA Abdomen:  Soft, bs hyp, nt, nd Skin:  No rash  LABS:  CBC  Recent Labs Lab 06/18/13 1435  WBC 28.8*  HGB 10.9*  HCT 33.5*  PLT 216   Coag's No results found for this basename: APTT, INR,  in the last 168 hours BMET  Recent Labs Lab 06/18/13 1435  NA 137  K 4.3  CL 97  CO2 18*  BUN 68*  CREATININE 4.55*  GLUCOSE 106*   Electrolytes  Recent Labs Lab 06/18/13 1435  CALCIUM 8.9   Sepsis Markers  Recent Labs Lab 06/18/13 1448  LATICACIDVEN 3.98*   ABG No results found for this basename: PHART, PCO2ART, PO2ART,  in the last 168 hours Liver Enzymes  Recent Labs Lab 06/18/13 1435  AST 32  ALT 21  ALKPHOS 26*  BILITOT 0.6  ALBUMIN 3.0*   Cardiac Enzymes  Recent Labs Lab 06/18/13 1435  TROPONINI <0.30   Glucose No results found for this basename: GLUCAP,  in the last 168 hours  Imaging Ct Abdomen Pelvis Wo Contrast  06/17/2013   CLINICAL DATA:  Left lower quadrant left flank pain.  Hematuria.  EXAM: CT ABDOMEN AND PELVIS WITHOUT CONTRAST  TECHNIQUE: Multidetector CT imaging of the abdomen and pelvis was performed following the standard protocol without intravenous contrast.  COMPARISON:  None.   FINDINGS: There is an 8 mm stone obstructing the proximal left ureter 4 cm below the renal pelvis. This is creating slight left hydronephrosis with slight perinephric soft tissue stranding. There are no other ureteral or renal calculi on the right or left.  Bladder appears normal.  There is a 3.9 cm multi-septated cystic neoplasm in the pancreatic head, most likely a benign serous cystic adenoma. There is no dilatation of the pancreatic duct or of the common bile duct. Gallbladder has been removed.  Liver, spleen and adrenal glands are normal.  The patient has numerous cysts in both kidneys. There is a  9 mm hyperdense cyst on the lateral aspect of the mid portion of the right kidney.  The uterus and ovaries have been removed. There are scattered diverticula in the distal colon. There is a 2 cm umbilical hernia containing only fat. No acute osseous abnormalities.  IMPRESSION: 1. 8 mm stone obstructing the proximal left ureter. 2. 3.9 cm cystic neoplasm of the pancreatic head, most likely a benign serous cystic adenoma.   Electronically Signed   By: Rozetta Nunnery M.D.   On: 06/17/2013 18:43   Dg Chest Portable 1 View  06/18/2013   CLINICAL DATA:  Shortness of breath.  EXAM: PORTABLE CHEST - 1 VIEW  COMPARISON:  DG CHEST 2 VIEW dated 07/28/2011  FINDINGS: Mediastinum hilar structures are normal. Poor inspiratory effort. Interim complete clearing of basilar atelectasis. Heart size normal. No acute bony abnormality. Surgical clips right neck.  IMPRESSION: Interim near complete clearing of basilar atelectasis.   Electronically Signed   By: Marcello Moores  Register   On: 06/18/2013 15:04     CXR:ATX  ASSESSMENT / PLAN:  PULMONARY A: ATX, OSA P:   IS Upright pcxr in am  O2 support Will discuss prior use cpap nocturnal  CARDIOVASCULAR A: Severe sepsis, occult septic shock P:  Repeat lactic, has responded to volume thus far Cortisol Resuscitate volume Tele Ecg, trop neg STAT source control If drops BP , will  place line, levophed to MAP goal 65 and egdt to cvp 12 Note osa, may have pulm HTn contribution to cvp (no echo in past)  RENAL A:  Stone ureteral, Presumed obstructive uropathy, ARF P:   To OR ASAP, Urology to take Worsening BP - consider perc nephrostomy Chem q8h At risk post obstructive diuresis, follow I/O closely Keep positive  GASTROINTESTINAL A:  Abdo pain, pancreatic lesion, r/o benign P:   Send ca 19-9 ppi npo  HEMATOLOGIC A:  leukocytosis P:  scd Sub q hep post eval consideration, pending bleeding risks  INFECTIOUS A:  Obstructive uropathy, stone P:   Add vanc stat to ceftriaxone If pressors required may consider change to ceftaz Urine, BC To OR ASAP May need urgent perc nephrostomy if clinical declines  ENDOCRINE A:  R/o AI P:   cortisol  NEUROLOGIC A:  pain P:   fent  TODAY'S SUMMARY: occult septic shock, responded to fluids well, to OR ASAP, vanc, ceftriaxone, lactic clearance, may need line, will follow up postop, cortisol  I have personally obtained a history, examined the patient, evaluated laboratory and imaging results, formulated the assessment and plan and placed orders. CRITICAL CARE: The patient is critically ill with multiple organ systems failure and requires high complexity decision making for assessment and support, frequent evaluation and titration of therapies, application of advanced monitoring technologies and extensive interpretation of multiple databases. Critical Care Time devoted to patient care services described in this note is 40 minutes.   Lavon Paganini. Titus Mould, MD, [place] Pgr: [facility]  P[facility] and [place] Pager: [phone]  06/18/2013, 4:05 PM

## 2013-06-18 NOTE — ED Notes (Signed)
Bed: [id number] Expected date:  Expected time:  Means of arrival:  Comments: EMS - low BP

## 2013-06-18 NOTE — H&P (Signed)
Chief Complaint Kidney stone   History of Present Illness Kim Burns is a 78 year old female seen today at the request of Dr. Marval Regal for a left ureteral stone. She has a history of chronic kidney disease thought to be related to hypertensive nephrosclerosis versus chronic interstitial nephritis with a baseline creatinine of approximately 2.2. She also has other medical comorbidities including a history of sleep apnea with an inability to tolerate CPAP, gout, history of stroke vs. TIA in 1995, and hyperparathyroidism.     I do not have her most recent notes from Dr. Marval Regal but she apparently presented to their office yesterday with the acute onset of severe left-sided abdominal pain. She underwent a CT scan which demonstrated an 8 mm proximal left ureteral calculus. There was no significant hydronephrosis noted. She did have significant perinephric left renal stranding. She has not been febrile but has been chilled. She has been nauseated but has not vomited. She did note gross hematuria this morning. She has a prior history of urolithiasis and spontaneously passed a kidney stone in 1966 and again in 1985. She denies a history of GU malignancy, trauma, or surgery. She does have a brother who has a history of kidney stones.   Past Medical History Problems  1. History of chronic kidney disease (V13.09) 2. History of gout (V12.29) 3. History of hyperparathyroidism (V12.29) 4. History of sleep apnea (V13.89) 5. History of transient cerebral ischemia (V12.54)  Surgical History Problems  1. History of Back Surgery 2. History of Breast Surgery 3. History of Gallbladder Surgery 4. History of Hysterectomy 5. History of Parathyroid Resection  Current Meds 1. CloNIDine HCl - 0.1 MG Oral Tablet;  Therapy: (Recorded:01May2015) to Recorded 2. Fish Oil Concentrate 1000 MG Oral Capsule;  Therapy: (Recorded:01May2015) to Recorded 3. Lasix 20 MG Oral Tablet;  Therapy: (Recorded:01May2015) to  Recorded 4. Lipitor 10 MG Oral Tablet;  Therapy: (Recorded:01May2015) to Recorded 5. Micardis 80 MG Oral Tablet;  Therapy: (Recorded:01May2015) to Recorded 6. Norco 5-325 MG Oral Tablet;  Therapy: (Recorded:01May2015) to Recorded 7. OxyCODONE HCl - 5 MG Oral Tablet;  Therapy: (Recorded:01May2015) to Recorded 8. Plavix 75 MG Oral Tablet;  Therapy: (Recorded:01May2015) to Recorded 9. Rocaltrol 0.25 MCG Oral Capsule;  Therapy: (Recorded:01May2015) to Recorded 10. Sensipar 30 MG Oral Tablet;   Therapy: (Recorded:01May2015) to Recorded 11. Tricor 145 MG Oral Tablet;   Therapy: (Recorded:01May2015) to Recorded 12. Tylenol PM Extra Strength TABS;   Therapy: (Recorded:01May2015) to Recorded 13. Uloric 80 MG Oral Tablet;   Therapy: (Recorded:01May2015) to Recorded 14. Vitamin D2 TABS;   Therapy: (Recorded:01May2015) to Recorded  Allergies Medication  1. Codeine Derivatives 2. Statins  Family History Problems  1. Family history of cardiac disorder (V17.49) : Mother 2. Family history of colon cancer (V16.0) : Father 3. Family history of malignant neoplasm of brain (V16.8) : Brother 4. Family history of stroke (V17.1) : Mother  Social History Problems    Denied: History of Alcohol use   Denied: History of Caffeine use   Never a smoker   Widowed  Review of Systems Genitourinary, constitutional, skin, eye, otolaryngeal, hematologic/lymphatic, cardiovascular, pulmonary, endocrine, musculoskeletal, gastrointestinal, neurological and psychiatric system(s) were reviewed and pertinent findings if present are noted.  Gastrointestinal: diarrhea and constipation.  Constitutional: feeling tired (fatigue).  Integumentary: skin rash/lesion.  Hematologic/Lymphatic: a tendency to easily bruise.  Musculoskeletal: back pain.  Neurological: dizziness and headache.    Vitals Vital Signs [Data Includes: Last 1 Day]  Recorded: [id number] 12:26PM  Blood Pressure: 72 / 47 Heart Rate:  78 Recorded: [id number] 12:23PM  Blood Pressure: 68 / 40 Temperature: 97.5 F Heart Rate: 80 Recorded: [id number] 10:07AM  Height: 5 ft 4 in Weight: 168 lb  BMI Calculated: 28.84 BSA Calculated: 1.82  Physical Exam Constitutional: Well nourished and well developed . No acute distress.  ENT:. The ears and nose are normal in appearance.  Neck: The appearance of the neck is normal and no neck mass is present.  Pulmonary: No respiratory distress, normal respiratory rhythm and effort and clear bilateral breath sounds.  Cardiovascular: Heart rate and rhythm are normal . No peripheral edema.  Abdomen: right upper quadrant incision site(s) well healed. No masses are palpated. Moderate tenderness in the LLQ is present. mild left CVA tenderness.  Lymphatics: The femoral and inguinal nodes are not enlarged or tender.  Skin: Normal skin turgor, no visible rash and no visible skin lesions.  Neuro/Psych:. Mood and affect are appropriate.    Results/Data Urine [Data Includes: Last 1 Day]   [id number]  COLOR RED   APPEARANCE TURBID   SPECIFIC GRAVITY 1.020   pH 7.0   GLUCOSE 100 mg/dL  BILIRUBIN LARGE   KETONE 15 mg/dL  BLOOD LARGE   PROTEIN > 300 mg/dL  UROBILINOGEN 4 mg/dL  NITRITE POS   LEUKOCYTE ESTERASE LARGE   SQUAMOUS EPITHELIAL/HPF FEW   WBC TNTC WBC/hpf  RBC TNTC RBC/hpf  BACTERIA MANY   CRYSTALS NONE SEEN   CASTS NONE SEEN    Urine has been cultured. I have reviewed her prior medical records and laboratory studies. I do not have her laboratory studies from yesterday.    I independently reviewed her CT scan from yesterday. Findings are as dictated above.   Discussion/Summary 1. 8 mm proximal left ureteral calculus: Considering her urinalysis, symptoms, and hypotension, I have recommended that she be transferred to the emergency room to receive IV fluid hydration and to begin empiric IV antibiotic therapy with ceftriaxone. I have recommended that she proceed with cystoscopy and  left ureteral stent placement this evening to decompress her left renal collecting system. She will then be admitted for observation and continued fluid hydration and antibiotic treatment pending her urine culture results. We will then plan to schedule definitive treatment of her stone over the next few weeks when she has recovered from her acute illness. We have reviewed the potential risks, complications, and alternative options associated with this procedure. She gives her informed consent proceed.    Cc: Dr. Donato Heinz  Dr. Florina Ou     Signatures Electronically signed by : Raynelle Bring, M.D.; Jun 18 2013  1:05PM EST

## 2013-06-18 NOTE — ED Notes (Signed)
Pt states still unable to void EDP made aware

## 2013-06-18 NOTE — Anesthesia Postprocedure Evaluation (Signed)
  Anesthesia Post Note  Patient: Kim Burns  Procedure(s) Performed: Procedure(s) (LRB): CYSTOSCOPY WITH Lef URETEROSCOPY AND Left STENT PLACEMENT (Left)  Anesthesia type: GA  Patient location: PACU  Post pain: Pain level controlled  Post assessment: Post-op Vital signs reviewed  Last Vitals:  Filed Vitals:   06/18/13 1930  BP: 100/64  Pulse:   Temp: 36.9 C  Resp:     Post vital signs: Reviewed  Level of consciousness: sedated  Complications: No apparent anesthesia complications

## 2013-06-18 NOTE — ED Notes (Signed)
Pt aware of need for urine specimen. 

## 2013-06-18 NOTE — ED Notes (Addendum)
Per EMS, pt was sent from [facility] due to hypotension. Pt's blood pressure was 80/44. Pt was given 348ml IV fluids, pressure is 97/56 upon arrival. Pt complains of dizziness when moving. Pt was being seen at [facility] for a kidney stone, which the pt states was confirmed yesterday with a CT scan. EMS states the [facility] physician wanted to the pt to be evaluated for low blood pressure and treatment for kidney stone. Pt alert and oriented x 4.

## 2013-06-19 ENCOUNTER — Inpatient Hospital Stay (HOSPITAL_COMMUNITY): Payer: Medicare Other

## 2013-06-19 DIAGNOSIS — N390 Urinary tract infection, site not specified: Secondary | ICD-10-CM

## 2013-06-19 DIAGNOSIS — I059 Rheumatic mitral valve disease, unspecified: Secondary | ICD-10-CM

## 2013-06-19 DIAGNOSIS — N201 Calculus of ureter: Secondary | ICD-10-CM

## 2013-06-19 LAB — URINE CULTURE: Colony Count: 1000

## 2013-06-19 LAB — CBC WITH DIFFERENTIAL/PLATELET
BASOS PCT: 0 % (ref 0–1)
Basophils Absolute: 0 10*3/uL (ref 0.0–0.1)
Eosinophils Absolute: 0 10*3/uL (ref 0.0–0.7)
Eosinophils Relative: 0 % (ref 0–5)
HCT: 32.1 % — ABNORMAL LOW (ref 36.0–46.0)
HEMOGLOBIN: 10.1 g/dL — AB (ref 12.0–15.0)
LYMPHS PCT: 3 % — AB (ref 12–46)
Lymphs Abs: 0.9 10*3/uL (ref 0.7–4.0)
MCH: 29.5 pg (ref 26.0–34.0)
MCHC: 31.5 g/dL (ref 30.0–36.0)
MCV: 93.9 fL (ref 78.0–100.0)
MONO ABS: 0.9 10*3/uL (ref 0.1–1.0)
Monocytes Relative: 3 % (ref 3–12)
NEUTROS ABS: 27.4 10*3/uL — AB (ref 1.7–7.7)
Neutrophils Relative %: 94 % — ABNORMAL HIGH (ref 43–77)
Platelets: 169 10*3/uL (ref 150–400)
RBC: 3.42 MIL/uL — ABNORMAL LOW (ref 3.87–5.11)
RDW: 14.8 % (ref 11.5–15.5)
WBC: 29.2 10*3/uL — ABNORMAL HIGH (ref 4.0–10.5)

## 2013-06-19 LAB — BASIC METABOLIC PANEL
BUN: 59 mg/dL — AB (ref 6–23)
BUN: 59 mg/dL — ABNORMAL HIGH (ref 6–23)
CALCIUM: 7 mg/dL — AB (ref 8.4–10.5)
CHLORIDE: 108 meq/L (ref 96–112)
CO2: 16 mEq/L — ABNORMAL LOW (ref 19–32)
CO2: 17 mEq/L — ABNORMAL LOW (ref 19–32)
Calcium: 7.4 mg/dL — ABNORMAL LOW (ref 8.4–10.5)
Chloride: 110 mEq/L (ref 96–112)
Creatinine, Ser: 3.1 mg/dL — ABNORMAL HIGH (ref 0.50–1.10)
Creatinine, Ser: 2.96 mg/dL — ABNORMAL HIGH (ref 0.50–1.10)
GFR calc Af Amer: 16 mL/min — ABNORMAL LOW (ref >90)
GFR, EST AFRICAN AMERICAN: 17 mL/min — AB (ref >90)
GFR, EST NON AFRICAN AMERICAN: 14 mL/min — AB (ref >90)
GFR, EST NON AFRICAN AMERICAN: 14 mL/min — AB (ref >90)
GLUCOSE: 119 mg/dL — AB (ref 70–99)
Glucose, Bld: 118 mg/dL — ABNORMAL HIGH (ref 70–99)
Potassium: 5.6 mEq/L — ABNORMAL HIGH (ref 3.7–5.3)
Potassium: 4.9 mEq/L (ref 3.7–5.3)
SODIUM: 143 meq/L (ref 137–147)
SODIUM: 143 meq/L (ref 137–147)

## 2013-06-19 LAB — COMPREHENSIVE METABOLIC PANEL
ALBUMIN: 2.5 g/dL — AB (ref 3.5–5.2)
ALT: 35 U/L (ref 0–35)
AST: 46 U/L — AB (ref 0–37)
Alkaline Phosphatase: 31 U/L — ABNORMAL LOW (ref 39–117)
BUN: 60 mg/dL — ABNORMAL HIGH (ref 6–23)
CO2: 15 mEq/L — ABNORMAL LOW (ref 19–32)
Calcium: 6.9 mg/dL — ABNORMAL LOW (ref 8.4–10.5)
Chloride: 109 mEq/L (ref 96–112)
Creatinine, Ser: 3.28 mg/dL — ABNORMAL HIGH (ref 0.50–1.10)
GFR calc Af Amer: 15 mL/min — ABNORMAL LOW (ref >90)
GFR calc non Af Amer: 13 mL/min — ABNORMAL LOW (ref >90)
Glucose, Bld: 129 mg/dL — ABNORMAL HIGH (ref 70–99)
Potassium: 5.7 mEq/L — ABNORMAL HIGH (ref 3.7–5.3)
SODIUM: 141 meq/L (ref 137–147)
TOTAL PROTEIN: 6.1 g/dL (ref 6.0–8.3)
Total Bilirubin: 0.5 mg/dL (ref 0.3–1.2)

## 2013-06-19 LAB — PRO B NATRIURETIC PEPTIDE: Pro B Natriuretic peptide (BNP): 15919 pg/mL — ABNORMAL HIGH (ref 0–450)

## 2013-06-19 LAB — GLUCOSE, CAPILLARY
Glucose-Capillary: 121 mg/dL — ABNORMAL HIGH (ref 70–99)
Glucose-Capillary: 135 mg/dL — ABNORMAL HIGH (ref 70–99)
Glucose-Capillary: 120 mg/dL — ABNORMAL HIGH (ref 70–99)
Glucose-Capillary: 137 mg/dL — ABNORMAL HIGH (ref 70–99)
Glucose-Capillary: 95 mg/dL (ref 70–99)

## 2013-06-19 MED ORDER — VANCOMYCIN HCL IN DEXTROSE 1-5 GM/200ML-% IV SOLN: 1000.0000 mg | INTRAVENOUS | Status: DC

## 2013-06-19 MED ORDER — MORPHINE SULFATE 2 MG/ML IJ SOLN
2.0000 mg | INTRAMUSCULAR | Status: DC | PRN
  Administered 2013-06-19 – 2013-06-20 (×2): 2 mg via INTRAVENOUS
  Filled 2013-06-19 (×2): qty 1

## 2013-06-19 MED ORDER — INSULIN ASPART 100 UNIT/ML ~~LOC~~ SOLN
0.0000 [IU] | Freq: Three times a day (TID) | SUBCUTANEOUS | Status: DC
  Administered 2013-06-20: 1 [IU] via SUBCUTANEOUS

## 2013-06-19 NOTE — Progress Notes (Addendum)
Patient ID: Kim Burns, female   DOB: 1935-05-03, 78 y.o.   MRN: [id number]  1 Day Post-Op Subjective: Pt feeling improved.  Pain is no longer present.  Complains of bladder spasms. HD stable overnight after initial hypotension immediately after surgery, did not require pressor agents.  Objective: Vital signs in last 24 hours: Temp:  [97.8 F (36.6 C)-98.7 F (37.1 C)] 97.9 F (36.6 C) (05/02 0400) Pulse Rate:  [79-93] 80 (05/02 0600) Resp:  [16-31] 18 (05/02 0600) BP: (88-109)/(47-74) 105/66 mmHg (05/02 0600) SpO2:  [95 %-100 %] 98 % (05/02 0600) Weight:  [80.8 kg (178 lb 2.1 oz)-81 kg (178 lb 9.2 oz)] 81 kg (178 lb 9.2 oz) (05/02 0659)  Intake/Output from previous day: 05/01 0701 - 05/02 0700 In: 3860 [P.O.:60; I.V.:3300; IV Piggyback:500] Out: 1270 [Urine:1270] Intake/Output this shift:    Physical Exam:  General: Alert and oriented CV: RRR Lungs: Clear Abdomen: Soft, ND, NT Ext: NT, No erythema  Lab Results:  Recent Labs  06/18/13 1435 06/18/13 1849 06/19/13 0331  HGB 10.9* 10.0* 10.1*  HCT 33.5* 30.1* 32.1*   Lab Results  Component Value Date   WBC 29.2* 06/19/2013   HGB 10.1* 06/19/2013   HCT 32.1* 06/19/2013   MCV 93.9 06/19/2013   PLT 169 06/19/2013     BMET  Recent Labs  06/18/13 1849 06/19/13 0331  NA 140 141  K 5.4* 5.7*  CL 108 109  CO2 17* 15*  GLUCOSE 130* 129*  BUN 58* 60*  CREATININE 3.57* 3.28*  CALCIUM 6.6* 6.9*     Studies/Results:  Dg Chest Port 1 View  06/19/2013   CLINICAL DATA:  Assess edema.  History of hypertension.  EXAM: PORTABLE CHEST - 1 VIEW  COMPARISON:  DG CHEST 1V PORT dated 06/18/2013; DG CHEST 2 VIEW dated 07/28/2011  FINDINGS: Shallow inspiration. Normal heart size and pulmonary vascularity. No focal airspace disease or consolidation. No definite blunting of costophrenic angles. No pneumothorax. Tortuous aorta.  IMPRESSION: Shallow inspiration.  No evidence of active infiltration.   Electronically Signed   By: Lucienne Capers M.D.   On: 06/19/2013 05:26      Assessment/Plan: POD#1 s/p left ureteral stent for left ureteral stone and sepsis - Continue IVF hydration and supportive care - Continue ceftriaxone and Vancomycin pending culture results - WBC increased and would expect to begin decreasing, will follow - AKI - Cr improving (baseline 2.2) - Will monitor in step down for majority of today and will consider transfer to floor later today or tomorrow morning - Will require definitive stone management as outpatient once recovered from acute infection - Hyperkalemia - will follow as renal function improves and will plan to restart furosemide once assured she is completely HD stable - Will restart antiplatelet therapy once clear she will not require any further invasive procedures acutely   LOS: 1 day   Dutch Gray 06/19/2013, 8:18 AM

## 2013-06-19 NOTE — Progress Notes (Signed)
Echocardiogram 2D Echocardiogram has been performed.  Kim Burns 06/19/2013, 12:07 PM

## 2013-06-19 NOTE — Progress Notes (Signed)
ANTIBIOTIC CONSULT NOTE  Pharmacy Consult for Vancomycin Indication: Sepsis 2/2 left ureteral stone  Allergies  Allergen Reactions  . Codeine Other (See Comments)    headache  . Statins Other (See Comments)    Muscle weakness   Patient Measurements: Height: 5\' 4"  (162.6 cm) Weight: 178 lb 9.2 oz (81 kg) IBW/kg (Calculated) : 54.7 Total Body Weight: 76.5 kg  Vital Signs: Temp: 97.9 F (36.6 C) (05/02 0400) BP: 114/69 mmHg (05/02 0800) Pulse Rate: 74 (05/02 0800) Intake/Output from previous day: 05/01 0701 - 05/02 0700 In: 3860 [P.O.:60; I.V.:3300; IV Piggyback:500] Out: 1270 [Urine:1270] Intake/Output from this shift:    Labs:  Recent Labs  06/18/13 1435  06/18/13 1849 06/19/13 0331 06/19/13 0745  WBC 28.8*  --  25.5* 29.2*  --   HGB 10.9*  --  10.0* 10.1*  --   PLT 216  --  180 169  --   CREATININE 4.55*  < > 3.57* 3.28* 3.10*  < > = values in this interval not displayed. Estimated Creatinine Clearance: 16.1 ml/min (by C-G formula based on Cr of 3.1). No results found for this basename: VANCOTROUGH, VANCOPEAK, VANCORANDOM, GENTTROUGH, GENTPEAK, GENTRANDOM, TOBRATROUGH, TOBRAPEAK, TOBRARND, AMIKACINPEAK, AMIKACINTROU, AMIKACIN,  in the last 72 hours   Microbiology: No results found for this or any previous visit (from the past 720 hour(s)).  Medical History: Past Medical History  Diagnosis Date  . Hypertension   . Thyroid disease   . CKD (chronic kidney disease) stage 3, GFR 30-59 ml/min   . Bradycardia   . TIA (transient ischemic attack)   . Anemia of chronic disease   . Gout   . Hyperparathyroidism   . Sleep apnea    Medications:  Anti-infectives   Start     Dose/Rate Route Frequency Ordered Stop   06/19/13 1500  cefTRIAXone (ROCEPHIN) 1 g in dextrose 5 % 50 mL IVPB     1 g 100 mL/hr over 30 Minutes Intravenous Every 24 hours 06/18/13 1637     06/18/13 1700  vancomycin (VANCOCIN) 1,500 mg in sodium chloride 0.9 % 500 mL IVPB     1,500 mg 250  mL/hr over 120 Minutes Intravenous  Once 06/18/13 1637 06/19/13 0107   06/18/13 1645  vancomycin (VANCOCIN) IVPB 1000 mg/200 mL premix  Status:  Discontinued     1,000 mg 200 mL/hr over 60 Minutes Intravenous  Once 06/18/13 1639 06/18/13 1709   06/18/13 1630  cefTRIAXone (ROCEPHIN) 1 g in dextrose 5 % 50 mL IVPB  Status:  Discontinued     1 g 100 mL/hr over 30 Minutes Intravenous Every 24 hours 06/18/13 1621 06/18/13 1637   06/18/13 1500  cefTRIAXone (ROCEPHIN) 1 g in dextrose 5 % 50 mL IVPB     1 g 100 mL/hr over 30 Minutes Intravenous  Once 06/18/13 1447 06/18/13 1559     Assessment: 80 yoF with onset of severe flank pain, L renal stone per CT. Hx of CKD (baseline Cr noted ~ 2.2), admit SCr 4.55 gm/dl. Vancomycin per pharmacy, rule out sepsis, Rocephin 1gm daily per MD.  Now POD1 cystoscopy/stent placement and renal fxn improving steadily.   D2 Antibiotics 5/1 >> Vanc >> 5/1 >> Rocephin/MD >>   Tmax: Afeb WBCs:Elevated 29K Renal: SCr improved to 3.10 (CrCl ~16 CG, N17) - baseline 2.2  5/1 blood: collected 5/1 urine: collected  Goal of Therapy:  Vancomycin trough level 15-20 mcg/ml  Plan:   Vancomycin 1g IV q48h, next dose tomorrow PM, follow renal fxn closely  for adjustments  CTX dose ok  Ralene Bathe, PharmD, BCPS 06/19/2013, 9:00 AM  Pager: [phone]

## 2013-06-19 NOTE — Progress Notes (Signed)
PULMONARY / CRITICAL CARE MEDICINE   Name: Kim Burns MRN: [id number] DOB: 02/09/1936    ADMISSION DATE:  06/18/2013 CONSULTATION DATE:  06/18/13  REFERRING MD :  Urology  PRIMARY SERVICE: Urology   CHIEF COMPLAINT:  Occult septic shock, stone  BRIEF PATIENT DESCRIPTION: 78 yr old left stone, occult septic shock  SIGNIFICANT EVENTS / STUDIES:  4/30 CT >>>8 mm stone obstructing the proximal left ureter.<BR>2. 3.9 cm cystic neoplasm of the pancreatic head, most likely a<BR>benign serous cystic adenoma 5/1 >> L ureteral stent placement  LINES / TUBES: piv  CULTURES: 5/1 BC>>> 5/1 urine>>>  ANTIBIOTICS: Ceftriaxone 5/1>>> vanc 5/1>>>  HISTORY OF PRESENT ILLNESS:  Kim Burns is a 78 year old female . She has a history of chronic kidney disease thought to be related to hypertensive nephrosclerosis versus chronic interstitial nephritis with a baseline creatinine 2.2.  She developed acute onset of severe left-sided abdominal pain. She underwent a CT scan which demonstrated an 8 mm proximal left ureteral calculus. There was no significant hydronephrosis noted. She did have significant perinephric left renal stranding. Reported to urology office and declined, sent to ED [facility]. IN ED with lactic acidosis, shock responding to volume thus far. Called to assist. sys increasing after 3 liters.  SUBJECTIVE:  Reports feeling much better, abd pain improved No hypotension overnight  VITAL SIGNS: Temp:  [97.8 F (36.6 C)-98.7 F (37.1 C)] 97.9 F (36.6 C) (05/02 0400) Pulse Rate:  [74-93] 74 (05/02 0800) Resp:  [16-31] 23 (05/02 0800) BP: (88-114)/(47-74) 114/69 mmHg (05/02 0800) SpO2:  [95 %-100 %] 100 % (05/02 0800) Weight:  [80.8 kg (178 lb 2.1 oz)-81 kg (178 lb 9.2 oz)] 81 kg (178 lb 9.2 oz) (05/02 0659) HEMODYNAMICS:   VENTILATOR SETTINGS:   INTAKE / OUTPUT: Intake/Output     05/01 0701 - 05/02 0700 05/02 0701 - 05/03 0700   P.O. 60    I.V. (mL/kg) 3300 (40.7)    IV Piggyback  500    Total Intake(mL/kg) 3860 (47.7)    Urine (mL/kg/hr) 1270    Total Output 1270     Net +2590            PHYSICAL EXAMINATION: General:  No distress Neuro:  Awake,nonfocal, O x 4 HEENT:  Obese, no jvd Cardiovascular:  s1 s2 RRr no r Lungs:  CTA Abdomen:  Soft, bs hyp, nt, nd Skin:  No rash  LABS:  CBC  Recent Labs Lab 06/18/13 1435 06/18/13 1849 06/19/13 0331  WBC 28.8* 25.5* 29.2*  HGB 10.9* 10.0* 10.1*  HCT 33.5* 30.1* 32.1*  PLT 216 180 169   Coag's No results found for this basename: APTT, INR,  in the last 168 hours BMET  Recent Labs Lab 06/18/13 1849 06/19/13 0331 06/19/13 0745  NA 140 141 143  K 5.4* 5.7* 5.6*  CL 108 109 110  CO2 17* 15* 16*  BUN 58* 60* 59*  CREATININE 3.57* 3.28* 3.10*  GLUCOSE 130* 129* 119*   Electrolytes  Recent Labs Lab 06/18/13 1849 06/19/13 0331 06/19/13 0745  CALCIUM 6.6* 6.9* 7.0*   Sepsis Markers  Recent Labs Lab 06/18/13 1448 06/18/13 1625 06/18/13 1642  LATICACIDVEN 3.98* 1.49 3.5*   ABG No results found for this basename: PHART, PCO2ART, PO2ART,  in the last 168 hours Liver Enzymes  Recent Labs Lab 06/18/13 1435 06/18/13 1849 06/19/13 0331  AST 32 41* 46*  ALT 21 29 35  ALKPHOS 26* 24* 31*  BILITOT 0.6 0.6 0.5  ALBUMIN 3.0* 2.3* 2.5*  Cardiac Enzymes  Recent Labs Lab 06/18/13 1435 06/18/13 1849 06/19/13 0331  TROPONINI <0.30  --   --   PROBNP  --  14523.0* 15919.0*   Glucose  Recent Labs Lab 06/19/13 0106 06/19/13 0401  GLUCAP 121* 135*    Imaging Ct Abdomen Pelvis Wo Contrast  06/17/2013   CLINICAL DATA:  Left lower quadrant left flank pain.  Hematuria.  EXAM: CT ABDOMEN AND PELVIS WITHOUT CONTRAST  TECHNIQUE: Multidetector CT imaging of the abdomen and pelvis was performed following the standard protocol without intravenous contrast.  COMPARISON:  None.  FINDINGS: There is an 8 mm stone obstructing the proximal left ureter 4 cm below the renal pelvis. This is creating  slight left hydronephrosis with slight perinephric soft tissue stranding. There are no other ureteral or renal calculi on the right or left.  Bladder appears normal.  There is a 3.9 cm multi-septated cystic neoplasm in the pancreatic head, most likely a benign serous cystic adenoma. There is no dilatation of the pancreatic duct or of the common bile duct. Gallbladder has been removed.  Liver, spleen and adrenal glands are normal.  The patient has numerous cysts in both kidneys. There is a 9 mm hyperdense cyst on the lateral aspect of the mid portion of the right kidney.  The uterus and ovaries have been removed. There are scattered diverticula in the distal colon. There is a 2 cm umbilical hernia containing only fat. No acute osseous abnormalities.  IMPRESSION: 1. 8 mm stone obstructing the proximal left ureter. 2. 3.9 cm cystic neoplasm of the pancreatic head, most likely a benign serous cystic adenoma.   Electronically Signed   By: Rozetta Nunnery M.D.   On: 06/17/2013 18:43   Dg Chest Port 1 View  06/19/2013   CLINICAL DATA:  Assess edema.  History of hypertension.  EXAM: PORTABLE CHEST - 1 VIEW  COMPARISON:  DG CHEST 1V PORT dated 06/18/2013; DG CHEST 2 VIEW dated 07/28/2011  FINDINGS: Shallow inspiration. Normal heart size and pulmonary vascularity. No focal airspace disease or consolidation. No definite blunting of costophrenic angles. No pneumothorax. Tortuous aorta.  IMPRESSION: Shallow inspiration.  No evidence of active infiltration.   Electronically Signed   By: Lucienne Capers M.D.   On: 06/19/2013 05:26   Dg Chest Portable 1 View  06/18/2013   CLINICAL DATA:  Shortness of breath.  EXAM: PORTABLE CHEST - 1 VIEW  COMPARISON:  DG CHEST 2 VIEW dated 07/28/2011  FINDINGS: Mediastinum hilar structures are normal. Poor inspiratory effort. Interim complete clearing of basilar atelectasis. Heart size normal. No acute bony abnormality. Surgical clips right neck.  IMPRESSION: Interim near complete clearing of basilar  atelectasis.   Electronically Signed   By: Marcello Moores  Register   On: 06/18/2013 15:04    ASSESSMENT / PLAN:  PULMONARY A: Hx OSA, not on CPAP P:   pulm hygiene post-op O2 support, wean as able She has not been able to tolerate CPAP in the past, will defer  CARDIOVASCULAR A: Severe sepsis, occult septic shock, improved - cortisol > 50 HTN P:  - holding clonidine, lasix, metoprolol > plan to restart as BP rises post-procedure - restart plavix  RENAL A:  Stone ureteral,  Presumed obstructive uropathy, acute on chronic renal failure P:   - follow BMP with volume and resolution of obstruction - followed outpt by Dr Marval Regal   GASTROINTESTINAL A:  Abdo pain, pancreatic lesion, r/o benign P:   - CA 19-9 pending   HEMATOLOGIC A:  leukocytosis  P:  - Enoxaparin for prophylaxis - Follow CBC, expect WBC to resolve  INFECTIOUS A:  Obstructive uropathy, stone Pyelonephritis  P:   - Vanco + ceftriaxone pending cx data, then narrow  ENDOCRINE A:  R/o AI > cortisol appropriately elevated P:   - Follow   NEUROLOGIC A:  Pain Hx TIA P:   - Fentanyl ordered - Restart plavix when safe post-procedure   I have personally obtained a history, examined the patient, evaluated laboratory and imaging results, formulated the assessment and plan and placed orders.  CCM will sign off, please call if we can assist in any way.   Baltazar Apo, MD, PhD 06/19/2013, 9:15 AM [facility] [phone] or if no answer [phone]

## 2013-06-20 ENCOUNTER — Inpatient Hospital Stay (HOSPITAL_COMMUNITY): Payer: Medicare Other

## 2013-06-20 LAB — CBC WITH DIFFERENTIAL/PLATELET
BASOS PCT: 0 % (ref 0–1)
Basophils Absolute: 0 10*3/uL (ref 0.0–0.1)
EOS PCT: 0 % (ref 0–5)
Eosinophils Absolute: 0 10*3/uL (ref 0.0–0.7)
HCT: 30.6 % — ABNORMAL LOW (ref 36.0–46.0)
HEMOGLOBIN: 9.9 g/dL — AB (ref 12.0–15.0)
LYMPHS PCT: 5 % — AB (ref 12–46)
Lymphs Abs: 1 10*3/uL (ref 0.7–4.0)
MCH: 29.9 pg (ref 26.0–34.0)
MCHC: 32.4 g/dL (ref 30.0–36.0)
MCV: 92.4 fL (ref 78.0–100.0)
Monocytes Absolute: 0.6 10*3/uL (ref 0.1–1.0)
Monocytes Relative: 3 % (ref 3–12)
Neutro Abs: 18.8 10*3/uL — ABNORMAL HIGH (ref 1.7–7.7)
Neutrophils Relative %: 92 % — ABNORMAL HIGH (ref 43–77)
Platelets: 169 10*3/uL (ref 150–400)
RBC: 3.31 MIL/uL — AB (ref 3.87–5.11)
RDW: 14.9 % (ref 11.5–15.5)
WBC Morphology: INCREASED
WBC: 20.4 10*3/uL — ABNORMAL HIGH (ref 4.0–10.5)

## 2013-06-20 LAB — GLUCOSE, CAPILLARY
GLUCOSE-CAPILLARY: 133 mg/dL — AB (ref 70–99)
GLUCOSE-CAPILLARY: 125 mg/dL — AB (ref 70–99)
GLUCOSE-CAPILLARY: 122 mg/dL — AB (ref 70–99)
Glucose-Capillary: 91 mg/dL (ref 70–99)
Glucose-Capillary: 96 mg/dL (ref 70–99)

## 2013-06-20 LAB — BASIC METABOLIC PANEL
BUN: 57 mg/dL — ABNORMAL HIGH (ref 6–23)
CHLORIDE: 111 meq/L (ref 96–112)
CO2: 15 mEq/L — ABNORMAL LOW (ref 19–32)
Calcium: 7.1 mg/dL — ABNORMAL LOW (ref 8.4–10.5)
Creatinine, Ser: 2.54 mg/dL — ABNORMAL HIGH (ref 0.50–1.10)
GFR, EST AFRICAN AMERICAN: 20 mL/min — AB (ref >90)
GFR, EST NON AFRICAN AMERICAN: 17 mL/min — AB (ref >90)
Glucose, Bld: 107 mg/dL — ABNORMAL HIGH (ref 70–99)
POTASSIUM: 4.7 meq/L (ref 3.7–5.3)
Sodium: 141 mEq/L (ref 137–147)

## 2013-06-20 MED ORDER — VANCOMYCIN HCL IN DEXTROSE 1-5 GM/200ML-% IV SOLN
1000.0000 mg | INTRAVENOUS | Status: DC
  Filled 2013-06-20: qty 200

## 2013-06-20 MED ORDER — FUROSEMIDE 20 MG PO TABS
20.0000 mg | ORAL_TABLET | Freq: Every day | ORAL | Status: DC
  Administered 2013-06-20 – 2013-06-21 (×2): 20 mg via ORAL
  Filled 2013-06-20 (×2): qty 1

## 2013-06-20 MED ORDER — AMOXICILLIN-POT CLAVULANATE 500-125 MG PO TABS
1.0000 | ORAL_TABLET | Freq: Two times a day (BID) | ORAL | Status: DC
  Administered 2013-06-20 – 2013-06-22 (×5): 500 mg via ORAL
  Filled 2013-06-20 (×6): qty 1

## 2013-06-20 MED ORDER — TRAMADOL HCL 50 MG PO TABS
100.0000 mg | ORAL_TABLET | Freq: Two times a day (BID) | ORAL | Status: DC | PRN
  Administered 2013-06-20: 100 mg via ORAL
  Filled 2013-06-20: qty 2

## 2013-06-20 MED ORDER — PANTOPRAZOLE SODIUM 40 MG PO TBEC
40.0000 mg | DELAYED_RELEASE_TABLET | Freq: Every day | ORAL | Status: DC
  Administered 2013-06-20 – 2013-06-22 (×3): 40 mg via ORAL
  Filled 2013-06-20 (×3): qty 1

## 2013-06-20 MED ORDER — CIPROFLOXACIN HCL 500 MG PO TABS
500.0000 mg | ORAL_TABLET | Freq: Every day | ORAL | Status: DC
  Administered 2013-06-20 – 2013-06-22 (×3): 500 mg via ORAL
  Filled 2013-06-20 (×3): qty 1

## 2013-06-20 MED ORDER — CLOPIDOGREL BISULFATE 75 MG PO TABS
75.0000 mg | ORAL_TABLET | Freq: Every day | ORAL | Status: DC
  Administered 2013-06-20 – 2013-06-22 (×3): 75 mg via ORAL
  Filled 2013-06-20 (×4): qty 1

## 2013-06-20 MED ORDER — SODIUM CHLORIDE 0.9 % IV SOLN
1000.0000 mL | INTRAVENOUS | Status: DC
  Administered 2013-06-20: 1000 mL via INTRAVENOUS

## 2013-06-20 NOTE — Progress Notes (Signed)
Nutrition Brief Note  Patient identified on the Malnutrition Screening Tool (MST) Report  Patient admitted with left ureteral stone and sepsis. She reports a decreased appetite for several days prior to admission, but usual intake is good. Her appetite is returning. Weight has been stable.   Wt Readings from Last 15 Encounters:  06/20/13 183 lb 13.8 oz (83.4 kg)  06/20/13 183 lb 13.8 oz (83.4 kg)  05/31/13 168 lb 9.6 oz (76.476 kg)  03/29/13 169 lb 12.8 oz (77.021 kg)  09/18/12 165 lb 12.8 oz (75.206 kg)    Body mass index is 31.54 kg/(m^2). Patient meets criteria for obesity, class I based on current BMI.   Current diet order is Carbohydrate Modified, patient is consuming approximately 75% of meals at this time. Labs and medications reviewed.   No nutrition interventions warranted at this time. If nutrition issues arise, please consult RD.   Larey Seat, RD, LDN Pager #: [phone] After-Hours Pager #: [phone]

## 2013-06-20 NOTE — Progress Notes (Addendum)
Patient ambulated in hallway approximately 100 feet with assistance x 2. Patient verbalized she feels weak and wanted to return to room, patient seemed winded saturations dropped to 88% but quickly returned to 92%-94% when patient returned to chair. Patient appears deconditioned/weak and would most likely benefit from a PT evaluation for safety because patient lives alone and currently does not use any ambulation devices.

## 2013-06-20 NOTE — Progress Notes (Signed)
ANTIBIOTIC CONSULT NOTE  Pharmacy Consult for Vancomycin Indication: rule out sepsis  Allergies  Allergen Reactions  . Codeine Other (See Comments)    headache  . Statins Other (See Comments)    Muscle weakness   Patient Measurements: Height: 5\' 4"  (162.6 cm) Weight: 183 lb 13.8 oz (83.4 kg) IBW/kg (Calculated) : 54.7 Total Body Weight: 76.5 kg  Vital Signs: Temp: 97.6 F (36.4 C) (05/03 0400) Temp src: Oral (05/03 0400) BP: 111/66 mmHg (05/03 0736) Pulse Rate: 80 (05/03 0736) Intake/Output from previous day: 05/02 0701 - 05/03 0700 In: 2675 [I.V.:2625; IV Piggyback:50] Out: 1100 [Urine:1100] Intake/Output from this shift:    Labs:  Recent Labs  06/18/13 1849 06/19/13 0331 06/19/13 0745 06/19/13 1412 06/20/13 0340  WBC 25.5* 29.2*  --   --  20.4*  HGB 10.0* 10.1*  --   --  9.9*  PLT 180 169  --   --  169  CREATININE 3.57* 3.28* 3.10* 2.96* 2.54*   Estimated Creatinine Clearance: 20 ml/min (by C-G formula based on Cr of 2.54). No results found for this basename: VANCOTROUGH, VANCOPEAK, VANCORANDOM, [place], GENTPEAK, GENTRANDOM, TOBRATROUGH, TOBRAPEAK, TOBRARND, AMIKACINPEAK, AMIKACINTROU, AMIKACIN,  in the last 72 hours   Microbiology: Recent Results (from the past 720 hour(s))  CULTURE, BLOOD (ROUTINE X 2)     Status: None   Collection Time    06/18/13  3:16 PM      Result Value Ref Range Status   Specimen Description BLOOD LEFT ANTECUBITAL   Final   Special Requests BOTTLES DRAWN AEROBIC AND ANAEROBIC 5CC   Final   Culture  Setup Time     Final   Value: 06/18/2013 19:01     Performed at [facility]   Culture     Final   Value:        BLOOD CULTURE RECEIVED NO GROWTH TO DATE CULTURE WILL BE HELD FOR 5 DAYS BEFORE ISSUING A FINAL NEGATIVE REPORT     Performed at [facility]   Report Status PENDING   Incomplete  CULTURE, BLOOD (ROUTINE X 2)     Status: None   Collection Time    06/18/13  4:15 PM      Result Value Ref Range Status    Specimen Description BLOOD RIGHT ANTECUBITAL   Final   Special Requests BOTTLES DRAWN AEROBIC AND ANAEROBIC 5CC   Final   Culture  Setup Time     Final   Value: 06/18/2013 19:01     Performed at [facility]   Culture     Final   Value:        BLOOD CULTURE RECEIVED NO GROWTH TO DATE CULTURE WILL BE HELD FOR 5 DAYS BEFORE ISSUING A FINAL NEGATIVE REPORT     Performed at [facility]   Report Status PENDING   Incomplete  URINE CULTURE     Status: None   Collection Time    06/18/13  4:33 PM      Result Value Ref Range Status   Specimen Description URINE, RANDOM   Final   Special Requests NONE   Final   Culture  Setup Time     Final   Value: 06/19/2013 01:24     Performed at [place]     Final   Value: 1,000 COLONIES/ML     Performed at [facility]   Culture     Final   Value: INSIGNIFICANT GROWTH  Performed at [facility]   Report Status 06/19/2013 FINAL   Final    Medical History: Past Medical History  Diagnosis Date  . Hypertension   . Thyroid disease   . CKD (chronic kidney disease) stage 3, GFR 30-59 ml/min   . Bradycardia   . TIA (transient ischemic attack)   . Anemia of chronic disease   . Gout   . Hyperparathyroidism   . Sleep apnea    Medications:  Anti-infectives   Start     Dose/Rate Route Frequency Ordered Stop   06/20/13 2200  vancomycin (VANCOCIN) IVPB 1000 mg/200 mL premix     1,000 mg 200 mL/hr over 60 Minutes Intravenous Every 48 hours 06/19/13 0904     06/19/13 1500  cefTRIAXone (ROCEPHIN) 1 g in dextrose 5 % 50 mL IVPB     1 g 100 mL/hr over 30 Minutes Intravenous Every 24 hours 06/18/13 1637     06/18/13 1700  vancomycin (VANCOCIN) 1,500 mg in sodium chloride 0.9 % 500 mL IVPB     1,500 mg 250 mL/hr over 120 Minutes Intravenous  Once 06/18/13 1637 06/19/13 0107   06/18/13 1645  vancomycin (VANCOCIN) IVPB 1000 mg/200 mL premix  Status:  Discontinued     1,000 mg 200 mL/hr over 60  Minutes Intravenous  Once 06/18/13 1639 06/18/13 1709   06/18/13 1630  cefTRIAXone (ROCEPHIN) 1 g in dextrose 5 % 50 mL IVPB  Status:  Discontinued     1 g 100 mL/hr over 30 Minutes Intravenous Every 24 hours 06/18/13 1621 06/18/13 1637   06/18/13 1500  cefTRIAXone (ROCEPHIN) 1 g in dextrose 5 % 50 mL IVPB     1 g 100 mL/hr over 30 Minutes Intravenous  Once 06/18/13 1447 06/18/13 1559     Assessment: 47 yoF with onset of severe flank pain, L renal stone per CT. Hx of CKD (baseline Cr noted ~ 2.2), admit SCr 4.55 gm/dl. Vancomycin per pharmacy, rule out sepsis, Rocephin 1gm daily per MD.    5/3: POD2 stent placement/cystoscopy. Renal fxn steadily improving.   D3 Antibiotics 5/1 >> Vanc >> 5/1 >> Rocephin/MD >>   Tmax: Afeb WBCs:Down to 20.4 Renal: SCr continues to improve, 2.54 (baseline ~2.2) (CrCl ~20,CG, N21)  5/1 blood: NGTD 5/1 urine: 1K insignificant growth  Drug level / dose changes info: 5/3: Vanc 1g q48h -->q24h for improved renal fxn  Goal of Therapy:  Vancomycin trough level 15-20 mcg/ml  Plan:   Adjust to vancomycin 1g IV q24h  Rocephin 1gm q24, not renally excreted, no adjustment necessary  F/u clinical course, cultures, renal fxn  Ralene Bathe, PharmD, BCPS 06/20/2013, 8:39 AM  Pager: [phone]

## 2013-06-20 NOTE — Progress Notes (Signed)
Patient ID: Kim Burns, female   DOB: 11-19-1935, 78 y.o.   MRN: [id number]  2 Days Post-Op Subjective: Pt feeling better. Does complain of some RUQ abdominal pain.  Relieved with pain medication.  She has remained HD stable for last 24 hrs.  Objective: Vital signs in last 24 hours: Temp:  [97.6 F (36.4 C)-98.3 F (36.8 C)] 97.6 F (36.4 C) (05/03 0400) Pulse Rate:  [79-87] 80 (05/03 0736) Resp:  [14-22] 18 (05/03 0736) BP: (98-121)/(59-75) 111/66 mmHg (05/03 0736) SpO2:  [94 %-100 %] 97 % (05/03 0736) Weight:  [83.4 kg (183 lb 13.8 oz)] 83.4 kg (183 lb 13.8 oz) (05/03 0400)  Intake/Output from previous day: 05/02 0701 - 05/03 0700 In: 2675 [I.V.:2625; IV Piggyback:50] Out: 1100 [Urine:1100] Intake/Output this shift:    Physical Exam:  General: Alert and oriented CV: RRR Lungs: Clear Abdomen: Soft, ND, NT Ext: NT, No erythema  Lab Results:  Recent Labs  06/18/13 1849 06/19/13 0331 06/20/13 0340  HGB 10.0* 10.1* 9.9*  HCT 30.1* 32.1* 30.6*   Lab Results  Component Value Date   WBC 20.4* 06/20/2013     BMET  Recent Labs  06/19/13 1412 06/20/13 0340  NA 143 141  K 4.9 4.7  CL 108 111  CO2 17* 15*  GLUCOSE 118* 107*  BUN 59* 57*  CREATININE 2.96* 2.54*  CALCIUM 7.4* 7.1*     Studies/Results: Urine culture: Insignificant growth Urine culture from [facility] office on 5/1: Multiple bacteria with no predominant organism Blood cultures: Negative thus far.  Assessment/Plan: POD#2 s/p left ureteral stent for left ureteral stone and sepsis  - Will decrease IVF - Will change to po broad spectrum antibiotic therapy considering cultures have not been helpful.  Will monitor WBC to ensure it continues to improve after antibiotic changes. - AKI - Cr continuing to improve (baseline 2.2)  - Transfer to floor - Will require definitive stone management as outpatient once recovered from acute infection  - Will restart antiplatelet therapy    LOS: 2 days   Dutch Gray 06/20/2013, 8:38 AM

## 2013-06-21 ENCOUNTER — Inpatient Hospital Stay (HOSPITAL_COMMUNITY): Payer: Medicare Other

## 2013-06-21 LAB — CBC WITH DIFFERENTIAL/PLATELET
BASOS ABS: 0 10*3/uL (ref 0.0–0.1)
BASOS PCT: 0 % (ref 0–1)
Eosinophils Absolute: 0 10*3/uL (ref 0.0–0.7)
Eosinophils Relative: 0 % (ref 0–5)
HCT: 32.8 % — ABNORMAL LOW (ref 36.0–46.0)
Hemoglobin: 10.6 g/dL — ABNORMAL LOW (ref 12.0–15.0)
LYMPHS PCT: 8 % — AB (ref 12–46)
Lymphs Abs: 1.5 10*3/uL (ref 0.7–4.0)
MCH: 29.7 pg (ref 26.0–34.0)
MCHC: 32.3 g/dL (ref 30.0–36.0)
MCV: 91.9 fL (ref 78.0–100.0)
Monocytes Absolute: 0.5 10*3/uL (ref 0.1–1.0)
Monocytes Relative: 3 % (ref 3–12)
NEUTROS ABS: 16 10*3/uL — AB (ref 1.7–7.7)
NEUTROS PCT: 88 % — AB (ref 43–77)
Platelets: 198 10*3/uL (ref 150–400)
RBC: 3.57 MIL/uL — ABNORMAL LOW (ref 3.87–5.11)
RDW: 14.7 % (ref 11.5–15.5)
WBC: 18.1 10*3/uL — AB (ref 4.0–10.5)

## 2013-06-21 LAB — BASIC METABOLIC PANEL
BUN: 52 mg/dL — AB (ref 6–23)
CHLORIDE: 110 meq/L (ref 96–112)
CO2: 18 mEq/L — ABNORMAL LOW (ref 19–32)
Calcium: 7.8 mg/dL — ABNORMAL LOW (ref 8.4–10.5)
Creatinine, Ser: 2.06 mg/dL — ABNORMAL HIGH (ref 0.50–1.10)
GFR, EST AFRICAN AMERICAN: 26 mL/min — AB (ref >90)
GFR, EST NON AFRICAN AMERICAN: 22 mL/min — AB (ref >90)
Glucose, Bld: 115 mg/dL — ABNORMAL HIGH (ref 70–99)
Potassium: 4.1 mEq/L (ref 3.7–5.3)
Sodium: 141 mEq/L (ref 137–147)

## 2013-06-21 LAB — GLUCOSE, CAPILLARY
Glucose-Capillary: 114 mg/dL — ABNORMAL HIGH (ref 70–99)
Glucose-Capillary: 119 mg/dL — ABNORMAL HIGH (ref 70–99)
Glucose-Capillary: 111 mg/dL — ABNORMAL HIGH (ref 70–99)
Glucose-Capillary: 120 mg/dL — ABNORMAL HIGH (ref 70–99)

## 2013-06-21 MED ORDER — HYDROCODONE-ACETAMINOPHEN 5-325 MG PO TABS
1.0000 | ORAL_TABLET | Freq: Four times a day (QID) | ORAL | Status: DC | PRN
  Administered 2013-06-21: 1 via ORAL
  Filled 2013-06-21: qty 1

## 2013-06-21 MED ORDER — FUROSEMIDE 40 MG PO TABS
40.0000 mg | ORAL_TABLET | Freq: Every day | ORAL | Status: DC
  Administered 2013-06-22: 40 mg via ORAL
  Filled 2013-06-21 (×2): qty 1

## 2013-06-21 MED ORDER — HYDROCODONE-ACETAMINOPHEN 5-325 MG PO TABS: 1.0000 | ORAL_TABLET | Freq: Four times a day (QID) | ORAL | Status: DC | PRN

## 2013-06-21 MED ORDER — CIPROFLOXACIN HCL 500 MG PO TABS: 500.0000 mg | ORAL_TABLET | Freq: Every day | ORAL | Status: DC

## 2013-06-21 MED ORDER — ALUM & MAG HYDROXIDE-SIMETH 200-200-20 MG/5ML PO SUSP
30.0000 mL | Freq: Four times a day (QID) | ORAL | Status: DC | PRN
  Administered 2013-06-21: 30 mL via ORAL
  Filled 2013-06-21: qty 30

## 2013-06-21 MED ORDER — ONDANSETRON HCL 4 MG/2ML IJ SOLN
4.0000 mg | Freq: Three times a day (TID) | INTRAMUSCULAR | Status: DC | PRN
  Administered 2013-06-21: 4 mg via INTRAVENOUS
  Filled 2013-06-21 (×2): qty 2

## 2013-06-21 MED ORDER — CLONIDINE HCL 0.1 MG PO TABS
0.1000 mg | ORAL_TABLET | Freq: Once | ORAL | Status: AC
  Administered 2013-06-21: 0.1 mg via ORAL
  Filled 2013-06-21: qty 1

## 2013-06-21 MED ORDER — ACETAMINOPHEN 325 MG PO TABS
650.0000 mg | ORAL_TABLET | Freq: Four times a day (QID) | ORAL | Status: DC | PRN
  Administered 2013-06-21: 650 mg via ORAL
  Filled 2013-06-21: qty 2

## 2013-06-21 MED ORDER — CLONIDINE HCL 0.1 MG PO TABS
0.1000 mg | ORAL_TABLET | Freq: Once | ORAL | Status: DC
  Filled 2013-06-21: qty 1

## 2013-06-21 MED ORDER — AMOXICILLIN-POT CLAVULANATE 500-125 MG PO TABS: 1.0000 | ORAL_TABLET | Freq: Two times a day (BID) | ORAL | Status: DC

## 2013-06-21 NOTE — Progress Notes (Signed)
Patient ID: Kim Burns, female   DOB: 12-Oct-1935, 78 y.o.   MRN: [id number]  3 Days Post-Op Subjective: Pt complains of dyspnea on even mild exertion. Pain improving.  Expected stent symptoms present since catheter removed yesterday.  Voiding well.  Objective: Vital signs in last 24 hours: Temp:  [97.4 F (36.3 C)-98.1 F (36.7 C)] 97.4 F (36.3 C) (05/04 0547) Pulse Rate:  [72-88] 81 (05/04 0547) Resp:  [16-22] 18 (05/04 0547) BP: (107-139)/(58-82) 139/82 mmHg (05/04 0547) SpO2:  [94 %-99 %] 99 % (05/04 0547) Weight:  [84.2 kg (185 lb 10 oz)] 84.2 kg (185 lb 10 oz) (05/03 2038)  Intake/Output from previous day: 05/03 0701 - 05/04 0700 In: 450 [I.V.:450] Out: 2250 [Urine:2250] Intake/Output this shift:    Physical Exam:  General: Alert and oriented CV: RRR Lungs: Clear Abdomen: Soft, ND, NT Ext: NT, No erythema  Lab Results:  Recent Labs  06/19/13 0331 06/20/13 0340 06/21/13 0450  HGB 10.1* 9.9* 10.6*  HCT 32.1* 30.6* 32.8*   Lab Results  Component Value Date   WBC 18.1* 06/21/2013   HGB 10.6* 06/21/2013   HCT 32.8* 06/21/2013   MCV 91.9 06/21/2013   PLT 198 06/21/2013     BMET  Recent Labs  06/20/13 0340 06/21/13 0450  NA 141 141  K 4.7 4.1  CL 111 110  CO2 15* 18*  GLUCOSE 107* 115*  BUN 57* 52*  CREATININE 2.54* 2.06*  CALCIUM 7.1* 7.8*     Studies/Results: Dg Abd 1 View  06/20/2013   CLINICAL DATA:  Evaluate for kidney stones.  EXAM: ABDOMEN - 1 VIEW  COMPARISON:  CT, 06/17/2013  FINDINGS: Left ureteral stent is new from the prior CT. Stone projects along the proximal margin of the stent consistent with the proximal ureteral stones seen on the prior CT.  No evidence of an intrarenal stone. There is a small phlebolith in the right lower quadrant and several others in the pelvis. There has been a cholecystectomy. Soft tissues are otherwise unremarkable. Degenerative changes are noted of the lumbar spine.  IMPRESSION: Ureteral stent appears well  positioned. A density projects along the proximal stent consistent with the ureteral stone seen on the recent prior CT.   Electronically Signed   By: Lajean Manes M.D.   On: 06/20/2013 14:24    Assessment/Plan: POD#3 s/p left ureteral stent for left ureteral stone and sepsis  - Saline lock IVF - Cultures without isolated bacteria. WBC improving on oral antibiotics with Augmentin and Cipro.  Will plan for discharge on this regimen for 14 days. - AKI - Cr back to baseline - Will require definitive stone management as outpatient once recovered from acute infection  - SOB: May be related to fluid overload after resuscitation last few days.  Echocardiogram indicated appropriate LV function. CXR this morning. Continue furosemide which was restarted yesterday. - Will reassess later today for possible discharge this afternoon or tomorrow.    LOS: 3 days   Dutch Gray 06/21/2013, 7:14 AM

## 2013-06-21 NOTE — Progress Notes (Signed)
Patient ID: Kim Burns, female   DOB: August 12, 1935, 78 y.o.   MRN: [id number]  CXR with small bilateral effusions.   Will diurese in the hospital today and if clinically improved over next 24 hrs, will discharge home tomorrow morning.

## 2013-06-21 NOTE — Discharge Instructions (Signed)
1. You may see some blood in the urine and may have some burning with urination for 48-72 hours. You also may notice that you have to urinate more frequently or urgently after your procedure which is normal.  °2. You should call should you develop an inability urinate, fever > 101, persistent nausea and vomiting that prevents you from eating or drinking to stay hydrated.  °3. If you have a stent, you will likely urinate more frequently and urgently until the stent is removed and you may experience some discomfort/pain in the lower abdomen and flank especially when urinating. You may take pain medication prescribed to you if needed for pain. You may also intermittently have blood in the urine until the stent is removed. ° °

## 2013-06-22 ENCOUNTER — Encounter (HOSPITAL_COMMUNITY): Payer: Self-pay | Admitting: Urology

## 2013-06-22 LAB — CBC
HCT: 33.8 % — ABNORMAL LOW (ref 36.0–46.0)
HEMOGLOBIN: 11.2 g/dL — AB (ref 12.0–15.0)
MCH: 29.8 pg (ref 26.0–34.0)
MCHC: 33.1 g/dL (ref 30.0–36.0)
MCV: 89.9 fL (ref 78.0–100.0)
Platelets: 207 10*3/uL (ref 150–400)
RBC: 3.76 MIL/uL — ABNORMAL LOW (ref 3.87–5.11)
RDW: 14.3 % (ref 11.5–15.5)
WBC: 11.3 10*3/uL — ABNORMAL HIGH (ref 4.0–10.5)

## 2013-06-22 LAB — GLUCOSE, CAPILLARY: Glucose-Capillary: 104 mg/dL — ABNORMAL HIGH (ref 70–99)

## 2013-06-22 NOTE — Progress Notes (Signed)
Pt DC instructions reviewed and all questions and concerns were addressed. Prescriptions given and reviewed as well. Pt is alert and oriented, VSS, no c/o pain at this time, skin intact- no breakdown noted, personal belongings returned to the pt from security where they had been locked up during admission. Her son is here to tx home. Pt does not appear to be in any distress or discomfort at this time. Will have NT wheel pt out.

## 2013-06-22 NOTE — Discharge Summary (Signed)
Date of admission: 06/18/2013  Date of discharge: 06/22/2013  Admission diagnosis: Sepsis, left ureteral stone  Discharge diagnosis: Same as above  History and Physical: For full details, please see admission history and physical. Briefly, Kim Burns is a 78 y.o. year old patient who presented with a left ureteral stone, infected urine and hypotension.   Hospital Course: She was immediately sent from our office to the ED to begin fluid resuscitation.  She was then taken to the OR the evening of 5/1 for ureteral stent placement.  She was treated with broad spectrum antibiotics and monitored in the ICU for 24 hours.  She remained hemodynamically stable and her WBC improved.  She was able to move to a regular hospital room on POD#2.  Her cultures did not isolate a specific bacteria and she was transitioned to oral Augmentin and Cipro for broad coverage.  Her WBC continued to improve and was almost normal upon discharge.  She did develop SOB on POD #3 and a CXR indicated bilateral small pleural effusions likely related to her prior fluid resuscitation.  She quickly improved after diuresis and was felt stable for discharge home by POD#4.  Laboratory values:  Recent Labs  06/20/13 0340 06/21/13 0450 06/22/13 0508  HGB 9.9* 10.6* 11.2*  HCT 30.6* 32.8* 33.8*    Recent Labs  06/20/13 0340 06/21/13 0450  CREATININE 2.54* 2.06*    Disposition: Home  Discharge instruction: The patient was instructed to call if she develops fever or uncontrolled pain. She was instructed that she may see hematuria and have urgency and frequency of urination.  Discharge medications:    Medication List         amoxicillin-clavulanate 500-125 MG per tablet  Commonly known as:  AUGMENTIN  Take 1 tablet (500 mg total) by mouth 2 (two) times daily.     atorvastatin 10 MG tablet  Commonly known as:  LIPITOR  Take 10 mg by mouth daily.     calcitRIOL 0.25 MCG capsule  Commonly known as:  ROCALTROL   Take 0.25 mcg by mouth 3 (three) times a week. Take 1 capsule every Monday, Wednesday and Friday     cinacalcet 30 MG tablet  Commonly known as:  SENSIPAR  Take 30 mg by mouth daily with supper.              ciprofloxacin 500 MG tablet  Commonly known as:  CIPRO  Take 1 tablet (500 mg total) by mouth daily.     cloNIDine 0.1 MG tablet  Commonly known as:  CATAPRES  Take 0.1 mg by mouth at bedtime.     clopidogrel 75 MG tablet  Commonly known as:  PLAVIX  Take 75 mg by mouth every morning.     diphenhydramine-acetaminophen 25-500 MG Tabs  Commonly known as:  TYLENOL PM  Take 1 tablet by mouth at bedtime.     fenofibrate 145 MG tablet  Commonly known as:  TRICOR  Take 145 mg by mouth daily.            furosemide 20 MG tablet  Commonly known as:  LASIX  Take 20 mg by mouth every morning.     HYDROcodone-acetaminophen 5-325 MG per tablet  Commonly known as:  NORCO/VICODIN  Take 1-2 tablets by mouth every 6 (six) hours as needed for moderate pain (pain).     metoprolol succinate 100 MG 24 hr tablet  Commonly known as:  TOPROL-XL  Take 100 mg by mouth daily. Take with or immediately  following a meal.              telmisartan 80 MG tablet  Commonly known as:  MICARDIS  Take 80 mg by mouth at bedtime.     ULORIC 80 MG Tabs  Generic drug:  Febuxostat  Take 80 mg by mouth daily.     VITAMIN D2 PO  Take 1.25 mg by mouth once a week. 1.25 mg = 50,000 units. Patient takes on Wednesdays        Followup:      Follow-up Information   Follow up with Dutch Gray, MD. (07/07/13 at 12:15 PM)    Specialty:  Urology   Contact information:   [facility]  [address] [place] [address] [phone]

## 2013-06-22 NOTE — Progress Notes (Signed)
Patient ID: Kim Burns, female   DOB: 28-Jul-1935, 78 y.o.   MRN: [id number]  4 Days Post-Op Subjective: Pt feeling much improved.  Denies SOB this morning.  Able to ambulate better.  Objective: Vital signs in last 24 hours: Temp:  [97.8 F (36.6 C)-98.4 F (36.9 C)] 98.2 F (36.8 C) (05/05 0542) Pulse Rate:  [68-86] 71 (05/05 0542) Resp:  [18] 18 (05/05 0542) BP: (116-161)/(77-99) 128/78 mmHg (05/05 0542) SpO2:  [96 %-97 %] 97 % (05/05 0542)  Intake/Output from previous day: 05/04 0701 - 05/05 0700 In: -  Out: 600 [Urine:600] Intake/Output this shift: Total I/O In: -  Out: 600 [Urine:600]  Physical Exam:  General: Alert and oriented CV: RRR Lungs: Clear Abdomen: Soft, ND, NT  Lab Results:  Recent Labs  06/20/13 0340 06/21/13 0450 06/22/13 0508  HGB 9.9* 10.6* 11.2*  HCT 30.6* 32.8* 33.8*   Lab Results  Component Value Date   WBC 11.3* 06/22/2013   HGB 11.2* 06/22/2013   HCT 33.8* 06/22/2013   MCV 89.9 06/22/2013   PLT 207 06/22/2013     BMET  Recent Labs  06/20/13 0340 06/21/13 0450  NA 141 141  K 4.7 4.1  CL 111 110  CO2 15* 18*  GLUCOSE 107* 115*  BUN 57* 52*  CREATININE 2.54* 2.06*  CALCIUM 7.1* 7.8*     Assessment/Plan: POD#4 s/p left ureteral stent for left ureteral stone and sepsis  - SOB resolved after diuresis yesterday - Cultures without isolated bacteria. WBC almost normal on oral antibiotics with Augmentin and Cipro. Will plan for discharge on this regimen for 14 days.  - AKI - Cr back to baseline  - Will require definitive stone management as outpatient once recovered from acute infection  - Discharge home    LOS: 4 days   Dutch Gray 06/22/2013, 6:43 AM

## 2013-06-24 LAB — CULTURE, BLOOD (ROUTINE X 2)
CULTURE: NO GROWTH
Culture: NO GROWTH

## 2013-07-01 ENCOUNTER — Other Ambulatory Visit: Payer: Self-pay | Admitting: Urology

## 2013-07-05 ENCOUNTER — Encounter (HOSPITAL_COMMUNITY): Payer: Self-pay | Admitting: Pharmacy Technician

## 2013-07-07 ENCOUNTER — Encounter (HOSPITAL_COMMUNITY): Payer: Self-pay | Admitting: *Deleted

## 2013-07-07 NOTE — H&P (Signed)
History of Present Illness Kim Burns is 78 years old with the following urologic history:    1) Urolithiasis: She initially presented in May 2015 with a left ureteral stone and sepsis. She had previously passed two stones spontaneously in the distant past.    ** She has a history of a TIA/mild CVA and is chronically treated with Plavix.    Interval history:    She follows up today after her initial admission requiring resuscitation, IV antibiotic therapy, and left ureteral stenting. Her stone was easily visualized in the left proximal ureter on KUB imaging in the hospital. Her urine culture did not isolate a single bacteria and she was treated with 2 weeks of Augmentin and Cipro with quick clinical improvement. We have discussed definitive treatment options for her stone and specifically discussed the pros and cons of ESWL vs. ureteroscopic laser lithotripsy. She has elected to proceed with ESWL therapy. She follows up today in preparation for that procedure and to check her urine following antibiotic treatment. She denies any fever. She has expected stent symptoms including urinary urgency and frequency.   Past Medical History Problems  1. History of chronic kidney disease (V13.09) 2. History of gout (V12.29) 3. History of hyperparathyroidism (V12.29) 4. History of sleep apnea (V13.89) 5. History of transient cerebral ischemia (V12.54)  Surgical History Problems  1. History of Back Surgery 2. History of Breast Surgery 3. History of Gallbladder Surgery 4. History of Hysterectomy 5. History of Parathyroid Resection  Current Meds 1. Amoxicillin-Pot Clavulanate 500-125 MG Oral Tablet;  Therapy: [id number] to Recorded 2. Ciprofloxacin HCl - 500 MG Oral Tablet;  Therapy: [id number] to Recorded 3. CloNIDine HCl - 0.1 MG Oral Tablet;  Therapy: (Recorded:01May2015) to Recorded 4. Fish Oil Concentrate 1000 MG Oral Capsule;  Therapy: (Recorded:01May2015) to Recorded 5. Lasix 20 MG  Oral Tablet;  Therapy: (Recorded:01May2015) to Recorded 6. Lipitor 10 MG Oral Tablet;  Therapy: (Recorded:01May2015) to Recorded 7. Micardis 80 MG Oral Tablet;  Therapy: (Recorded:01May2015) to Recorded 8. Norco 5-325 MG Oral Tablet;  Therapy: (Recorded:01May2015) to Recorded 9. OxyCODONE HCl - 5 MG Oral Tablet;  Therapy: (Recorded:01May2015) to Recorded 10. Plavix 75 MG Oral Tablet;   Therapy: (Recorded:01May2015) to Recorded 11. Rocaltrol 0.25 MCG Oral Capsule;   Therapy: (Recorded:01May2015) to Recorded 12. Sensipar 30 MG Oral Tablet;   Therapy: (Recorded:01May2015) to Recorded 13. Tricor 145 MG Oral Tablet;   Therapy: (Recorded:01May2015) to Recorded 14. Tylenol PM Extra Strength TABS;   Therapy: (Recorded:01May2015) to Recorded 15. Uloric 80 MG Oral Tablet;   Therapy: (Recorded:01May2015) to Recorded 16. Vitamin D2 TABS;   Therapy: (Recorded:01May2015) to Recorded  Allergies Medication  1. Codeine Derivatives 2. Statins  Family History Problems  1. Family history of cardiac disorder (V17.49) : Mother 2. Family history of colon cancer (V16.0) : Father 3. Family history of malignant neoplasm of brain (V16.8) : Brother 4. Family history of stroke (V17.1) : Mother  Social History Problems  1. Denied: History of Alcohol use 2. Denied: History of Caffeine use 3. Never a smoker 4. Widowed  Vitals Vital Signs [Data Includes: Last 1 Day]  Recorded: [id number] 12:32PM  Blood Pressure: 106 / 74 Heart Rate: 67  Physical Exam Constitutional: Well nourished and well developed . No acute distress.  ENT:. The ears and nose are normal in appearance.  Neck: The appearance of the neck is normal and no neck mass is present.  Pulmonary: No respiratory distress and normal respiratory rhythm and effort.  Cardiovascular: Heart rate and rhythm are  normal . No peripheral edema.  Abdomen: The abdomen is soft and nontender. No masses are palpated. No CVA tenderness. No hernias are  palpable. No hepatosplenomegaly noted.  Neuro/Psych:. Mood and affect are appropriate.    Results/Data Urine [Data Includes: Last 1 Day]   [id number]  COLOR AMBER   APPEARANCE CLOUDY   SPECIFIC GRAVITY 1.010   pH 6.5   GLUCOSE NEG mg/dL  BILIRUBIN NEG   KETONE NEG mg/dL  BLOOD LARGE   PROTEIN NEG mg/dL  UROBILINOGEN 0.2 mg/dL  NITRITE NEG   LEUKOCYTE ESTERASE SMALL   SQUAMOUS EPITHELIAL/HPF FEW   WBC 3-6 WBC/hpf  RBC 21-50 RBC/hpf  BACTERIA NONE SEEN   CRYSTALS NONE SEEN   CASTS NONE SEEN    Urine has been cultured.     I internally reviewed her KUB x-ray today. This demonstrates a stable 8 mm calcification over the expected course of the proximal ureter adjacent to the ureteral stent which appears to be in appropriate position.   Assessment Assessed  1. Ureteral stone (592.1)  Plan Health Maintenance  1. UA With REFLEX; [Do Not Release]; Status:Complete;   [id number] 12:17PM  Discussion/Summary She appears to have recovered appropriately from her recent infection associated with left ureteral obstruction. Her urine has been sent for culture as a precaution. She has completed her ciprofloxacin but remains on Augmentin. I recommended that she proceed with shockwave lithotripsy is planned for tomorrow. We reviewed the potential risks and complications associated with this procedure including but not limited to bleeding, infection, failure of the stone to fragment, need for additional procedures, renal hematoma, etc. She gives her informed consent.    Cc: Dr. Donato Heinz  Dr. Florina Ou     Signatures Electronically signed by : Raynelle Bring, M.D.; Jul 07 2013  1:32PM EST

## 2013-07-08 ENCOUNTER — Encounter (HOSPITAL_COMMUNITY): Payer: Self-pay | Admitting: *Deleted

## 2013-07-08 ENCOUNTER — Ambulatory Visit (HOSPITAL_COMMUNITY): Payer: Medicare Other

## 2013-07-08 ENCOUNTER — Encounter (HOSPITAL_COMMUNITY)
Admission: RE | Disposition: A | Discharge status: Home / Self Care | Payer: Self-pay | Source: Ambulatory Visit | Attending: Urology

## 2013-07-08 ENCOUNTER — Ambulatory Visit (HOSPITAL_COMMUNITY)
Admission: RE | Admit: 2013-07-08 | Discharge: 2013-07-08 | Disposition: A | Discharge status: Home / Self Care | Payer: Medicare Other | Source: Ambulatory Visit | Attending: Urology | Admitting: Urology

## 2013-07-08 DIAGNOSIS — Z79899 Other long term (current) drug therapy: Secondary | ICD-10-CM | POA: Insufficient documentation

## 2013-07-08 DIAGNOSIS — E213 Hyperparathyroidism, unspecified: Secondary | ICD-10-CM | POA: Insufficient documentation

## 2013-07-08 DIAGNOSIS — N201 Calculus of ureter: Secondary | ICD-10-CM | POA: Insufficient documentation

## 2013-07-08 DIAGNOSIS — M109 Gout, unspecified: Secondary | ICD-10-CM | POA: Insufficient documentation

## 2013-07-08 DIAGNOSIS — Z8673 Personal history of transient ischemic attack (TIA), and cerebral infarction without residual deficits: Secondary | ICD-10-CM | POA: Insufficient documentation

## 2013-07-08 DIAGNOSIS — G4730 Sleep apnea, unspecified: Secondary | ICD-10-CM | POA: Insufficient documentation

## 2013-07-08 DIAGNOSIS — N189 Chronic kidney disease, unspecified: Secondary | ICD-10-CM | POA: Insufficient documentation

## 2013-07-08 DIAGNOSIS — Z7902 Long term (current) use of antithrombotics/antiplatelets: Secondary | ICD-10-CM | POA: Insufficient documentation

## 2013-07-08 HISTORY — DX: Personal history of urinary calculi: Z87.442

## 2013-07-08 SURGERY — LITHOTRIPSY, ESWL
Anesthesia: LOCAL | Laterality: Left

## 2013-07-08 MED ORDER — DIPHENHYDRAMINE HCL 25 MG PO CAPS
25.0000 mg | ORAL_CAPSULE | ORAL | Status: AC
  Administered 2013-07-08: 25 mg via ORAL
  Filled 2013-07-08: qty 1

## 2013-07-08 MED ORDER — DEXTROSE-NACL 5-0.45 % IV SOLN
INTRAVENOUS | Status: DC
Start: 2013-07-08 — End: 2013-07-08
  Administered 2013-07-08: 14:00:00 via INTRAVENOUS

## 2013-07-08 MED ORDER — CIPROFLOXACIN HCL 500 MG PO TABS
500.0000 mg | ORAL_TABLET | ORAL | Status: AC
  Administered 2013-07-08: 500 mg via ORAL
  Filled 2013-07-08: qty 1

## 2013-07-08 MED ORDER — DIAZEPAM 5 MG PO TABS
10.0000 mg | ORAL_TABLET | ORAL | Status: AC
  Administered 2013-07-08: 10 mg via ORAL
  Filled 2013-07-08: qty 2

## 2013-07-08 NOTE — Op Note (Signed)
See paper chart for operative note.

## 2013-07-08 NOTE — Interval H&P Note (Signed)
History and Physical Interval Note:  07/08/2013 3:01 PM  Kim Burns  has presented today for surgery, with the diagnosis of Left Ureteral Calculus  The various methods of treatment have been discussed with the patient and family. After consideration of risks, benefits and other options for treatment, the patient has consented to  Procedure(s): LEFT EXTRACORPOREAL SHOCK WAVE LITHOTRIPSY (ESWL) (Left) as a surgical intervention .  The patient's history has been reviewed, patient examined, no change in status, stable for surgery.  I have reviewed the patient's chart and labs.  Questions were answered to the patient's satisfaction.     Raynelle Bring

## 2013-07-08 NOTE — Discharge Instructions (Signed)
1. You should strain your urine and collect all fragments and bring them to your follow up appointment.  2. You should take your pain medication as needed.  Please call if your pain is severe to the point that it is not controlled with your pain medication. 3. You should call if you develop fever > 101 or persistent nausea or vomiting.   

## 2013-12-16 ENCOUNTER — Other Ambulatory Visit: Payer: Self-pay

## 2013-12-16 DIAGNOSIS — Z1231 Encounter for screening mammogram for malignant neoplasm of breast: Secondary | ICD-10-CM

## 2014-01-06 ENCOUNTER — Ambulatory Visit
Admission: RE | Admit: 2014-01-06 | Discharge: 2014-01-06 | Disposition: A | Discharge status: Home / Self Care | Payer: Medicare Other | Source: Ambulatory Visit

## 2014-01-06 DIAGNOSIS — Z1231 Encounter for screening mammogram for malignant neoplasm of breast: Secondary | ICD-10-CM

## 2014-01-10 ENCOUNTER — Other Ambulatory Visit: Payer: Self-pay | Admitting: Nephrology

## 2014-01-18 ENCOUNTER — Other Ambulatory Visit: Payer: Self-pay | Admitting: Nephrology

## 2014-01-18 ENCOUNTER — Other Ambulatory Visit: Payer: Self-pay | Admitting: Otolaryngology

## 2014-01-18 DIAGNOSIS — H906 Mixed conductive and sensorineural hearing loss, bilateral: Secondary | ICD-10-CM

## 2014-01-18 DIAGNOSIS — K8689 Other specified diseases of pancreas: Secondary | ICD-10-CM

## 2014-01-19 ENCOUNTER — Ambulatory Visit
Admission: RE | Admit: 2014-01-19 | Discharge: 2014-01-19 | Disposition: A | Discharge status: Home / Self Care | Payer: Medicare Other | Source: Ambulatory Visit | Attending: Otolaryngology | Admitting: Otolaryngology

## 2014-01-19 ENCOUNTER — Other Ambulatory Visit: Payer: Medicare Other

## 2014-01-19 DIAGNOSIS — H906 Mixed conductive and sensorineural hearing loss, bilateral: Secondary | ICD-10-CM

## 2014-01-21 ENCOUNTER — Other Ambulatory Visit: Payer: Self-pay | Admitting: Otolaryngology

## 2014-01-21 DIAGNOSIS — H906 Mixed conductive and sensorineural hearing loss, bilateral: Secondary | ICD-10-CM

## 2014-01-26 ENCOUNTER — Other Ambulatory Visit: Payer: Medicare Other

## 2014-01-27 ENCOUNTER — Ambulatory Visit
Admission: RE | Admit: 2014-01-27 | Discharge: 2014-01-27 | Disposition: A | Discharge status: Home / Self Care | Payer: Medicare Other | Source: Ambulatory Visit | Attending: Nephrology | Admitting: Nephrology

## 2014-01-27 DIAGNOSIS — K8689 Other specified diseases of pancreas: Secondary | ICD-10-CM

## 2014-03-24 ENCOUNTER — Encounter: Payer: 59 | Attending: Nurse Practitioner | Admitting: Dietician

## 2014-03-24 ENCOUNTER — Encounter: Payer: Self-pay | Admitting: Dietician

## 2014-03-24 VITALS — Wt 168.0 lb

## 2014-03-24 DIAGNOSIS — Z713 Dietary counseling and surveillance: Secondary | ICD-10-CM | POA: Diagnosis not present

## 2014-03-24 DIAGNOSIS — E663 Overweight: Secondary | ICD-10-CM | POA: Diagnosis not present

## 2014-03-24 NOTE — Patient Instructions (Signed)
-  Keep limiting sodium -Cut out Coke and Pepsi -Increase fiber intake -Limit portions of starches

## 2014-03-24 NOTE — Progress Notes (Signed)
  Medical Nutrition Therapy:  Appt start time: 225 end time:  330   Assessment:  Primary concerns today: Kim Burns is here today to discuss her stage 2 CKD and high TGs and cholesterol. Has not received nutrition recommendations for her CKD. However, she tries to limit salt by avoiding canned food and not adding seasoning to food. She lives alone. 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 Also like to travel.  Has 3 adult children that live far away. 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  Preferred Learning Style:  No preference indicated   Learning Readiness:   Ready   MEDICATIONS: see list   DIETARY INTAKE:  24-hr recall:  *Wakes up between 7:30-8:30 B ( AM): 4 oz orange juice with medicine, cereal or oatmeal or 2 pieces whole wheat toast with butter and strawberry jam  Snk ( AM):   L ( PM): large apple OR 2 hotdogs with baked chips Snk ( PM):  D (6 PM): 1/3 cup turnip greens, 1/3 cup squash, BBQ sandwich, 1/2 cup macaroni and cheese Snk ( PM): 1/2 apple or 1/2 pear *Bed between 10-11pm  Beverages: water, 2 cans Pepsi and Coke per day, unsweet tea, coffee  Usual physical activity: "not much" ; yardwork and cleaning the pool in the summertime  Estimated energy needs: 1500-1600 calories 170-180 g carbohydrates 112-120 g protein 42-44 g fat  Progress Towards Goal(s):  In progress.   Nutritional Diagnosis:  [place]-2.2 Altered nutrition-related laboratory As related to excessive carbohydrate intake.  As evidenced by elevated TGs and total cholesterol.    Intervention:  Nutrition counseling provided. Encouraged patient to continue limiting sodium. Recommended that she eliminate sodas from her diet. Identified high-fiber foods and encouraged patient to increase. Recommended she limit portions of sugar and starchy foods. Demonstrated appropriate portion sizes.  Teaching Method Utilized:  Visual Auditory Hands  on  Handouts given during visit include:  High fiber food list  Meal planning card  Cholesterol and TGs  Snack list  Barriers to learning/adherence to lifestyle change: ankle injury  Demonstrated degree of understanding via:  Teach Back   Monitoring/Evaluation:  Dietary intake, exercise, labs, and body weight in 3 month(s).

## 2014-04-12 ENCOUNTER — Ambulatory Visit: Payer: Self-pay | Admitting: Physical Therapy

## 2014-05-31 ENCOUNTER — Encounter: Payer: Medicare Other | Attending: Nurse Practitioner | Admitting: Dietician

## 2014-05-31 VITALS — Wt 169.2 lb

## 2014-05-31 DIAGNOSIS — Z713 Dietary counseling and surveillance: Secondary | ICD-10-CM | POA: Diagnosis not present

## 2014-05-31 DIAGNOSIS — E663 Overweight: Secondary | ICD-10-CM | POA: Diagnosis not present

## 2014-05-31 DIAGNOSIS — E785 Hyperlipidemia, unspecified: Secondary | ICD-10-CM

## 2014-05-31 NOTE — Progress Notes (Signed)
  Medical Nutrition Therapy:  Appt start time: 200 end time: 230   Follow up:  Primary concerns today: Kim Burns returns having maintained her weight. She states her ankle is still injured and she is also having problems with her eyes, likely due to severe dryness. Has cut back on sodas significantly. Has been drinking unsweet, caffeine free tea, lemonade made with Splenda and more water overall. She reports that her doctor has recommended that she reduce cheese and other Calcium-rich foods. Having some blood sugar "drops." Wants to snack in the evenings.   Preferred Learning Style:  No preference indicated   Learning Readiness:   Ready   MEDICATIONS: see list   DIETARY INTAKE:  24-hr recall:   *Wakes up between 7:30-8:30  B ( AM): 3 pieces of [place] bacon with 2 boiled egg yolks, 2 pieces of toast with butter and strawberry preserves Snk ( AM):   L ( PM): 1/2 banana and a small apple Snk ( PM):  D (6 PM): meatloaf or salmon patties with green beans or spaghetti with meat sauce or BBQ with baked beans and slaw Snk ( PM): saltines with peanut butter  *Bed between 10-11pm  Beverages: water, 2 cans Pepsi and Coke per day, unsweet tea, coffee  Usual physical activity: "not much" ; yardwork and cleaning the pool in the summertime  Estimated energy needs: 1500-1600 calories 170-180 g carbohydrates 112-120 g protein 42-44 g fat  Progress Towards Goal(s):  In progress.   Nutritional Diagnosis:  Kim Burns-2.2 Altered nutrition-related laboratory As related to excessive carbohydrate intake.  As evidenced by elevated TGs and total cholesterol.    Intervention:  Nutrition counseling provided. Encouraged patient to continue limiting sodium. Recommended that she eliminate sodas from her diet. Identified high-fiber foods and encouraged patient to increase. Recommended she limit portions of sugar and starchy foods. Demonstrated appropriate portion sizes.  Goals: -Keep limiting  sodium -Continue to avoid dark sodas (okay to have small amounts of clear sodas) -Increase fiber intake -Limit portions of starches to about 1/2 cup per meal -Continue to choose lean meats -Have something to eat every 3-5 hours  Teaching Method Utilized:  Visual Auditory Hands on  Barriers to learning/adherence to lifestyle change: ankle injury  Demonstrated degree of understanding via:  Teach Back   Monitoring/Evaluation:  Dietary intake, exercise, labs, and body weight prn.

## 2014-05-31 NOTE — Patient Instructions (Addendum)
-  Keep limiting sodium -Continue to avoid dark sodas (okay to have small amounts of clear sodas) -Increase fiber intake -Limit portions of starches to about 1/2 cup per meal -Continue to choose lean meats -Have something to eat every 3-5 hours

## 2014-07-14 ENCOUNTER — Other Ambulatory Visit: Payer: Self-pay | Admitting: Otolaryngology

## 2014-07-14 DIAGNOSIS — D333 Benign neoplasm of cranial nerves: Secondary | ICD-10-CM

## 2014-07-14 DIAGNOSIS — H933X2 Disorders of left acoustic nerve: Secondary | ICD-10-CM

## 2014-07-25 ENCOUNTER — Other Ambulatory Visit: Payer: 59

## 2014-07-29 ENCOUNTER — Ambulatory Visit
Admission: RE | Admit: 2014-07-29 | Discharge: 2014-07-29 | Disposition: A | Discharge status: Home / Self Care | Payer: Medicare Other | Source: Ambulatory Visit | Attending: Otolaryngology | Admitting: Otolaryngology

## 2014-07-29 DIAGNOSIS — H933X2 Disorders of left acoustic nerve: Secondary | ICD-10-CM

## 2014-07-29 DIAGNOSIS — D333 Benign neoplasm of cranial nerves: Secondary | ICD-10-CM

## 2014-09-02 ENCOUNTER — Other Ambulatory Visit: Payer: Self-pay | Admitting: Nephrology

## 2014-09-02 DIAGNOSIS — R7989 Other specified abnormal findings of blood chemistry: Secondary | ICD-10-CM

## 2014-09-02 DIAGNOSIS — N183 Chronic kidney disease, stage 3 (moderate): Secondary | ICD-10-CM

## 2014-09-05 ENCOUNTER — Ambulatory Visit
Admission: RE | Admit: 2014-09-05 | Discharge: 2014-09-05 | Disposition: A | Discharge status: Home / Self Care | Payer: Medicare Other | Source: Ambulatory Visit | Attending: Nephrology | Admitting: Nephrology

## 2014-09-05 DIAGNOSIS — R7989 Other specified abnormal findings of blood chemistry: Secondary | ICD-10-CM

## 2014-09-05 DIAGNOSIS — N183 Chronic kidney disease, stage 3 (moderate): Secondary | ICD-10-CM

## 2015-01-11 ENCOUNTER — Other Ambulatory Visit: Payer: Self-pay

## 2015-01-11 DIAGNOSIS — Z1231 Encounter for screening mammogram for malignant neoplasm of breast: Secondary | ICD-10-CM

## 2015-02-27 ENCOUNTER — Ambulatory Visit: Payer: Medicare Other

## 2015-03-15 ENCOUNTER — Ambulatory Visit: Payer: Medicare Other

## 2015-03-16 ENCOUNTER — Other Ambulatory Visit: Payer: Self-pay | Admitting: Family Medicine

## 2015-03-16 ENCOUNTER — Ambulatory Visit
Admission: RE | Admit: 2015-03-16 | Discharge: 2015-03-16 | Disposition: A | Discharge status: Home / Self Care | Payer: 59 | Source: Ambulatory Visit | Attending: Family Medicine | Admitting: Family Medicine

## 2015-03-16 DIAGNOSIS — R51 Headache: Principal | ICD-10-CM

## 2015-03-16 DIAGNOSIS — R519 Headache, unspecified: Secondary | ICD-10-CM

## 2015-08-31 ENCOUNTER — Ambulatory Visit (INDEPENDENT_AMBULATORY_CARE_PROVIDER_SITE_OTHER): Payer: Medicare Other | Admitting: Family Medicine

## 2015-08-31 ENCOUNTER — Encounter: Payer: Self-pay | Admitting: Family Medicine

## 2015-08-31 VITALS — Ht 63.5 in | Wt 159.8 lb

## 2015-08-31 DIAGNOSIS — N183 Chronic kidney disease, stage 3 unspecified: Secondary | ICD-10-CM

## 2015-08-31 DIAGNOSIS — E782 Mixed hyperlipidemia: Secondary | ICD-10-CM | POA: Diagnosis not present

## 2015-08-31 DIAGNOSIS — R739 Hyperglycemia, unspecified: Secondary | ICD-10-CM | POA: Diagnosis not present

## 2015-08-31 NOTE — Patient Instructions (Addendum)
  Diet Recommendations for Diabetes  Carbohydrate includes starch, sugar, and fiber.  Only starch and sugar raise your blood glucose.  This is why the best carbohydrates contain fiber.  Low-glycemic means a food does not raise blood sugar levels a lot.    Starchy (carb) foods: Bread, rice, pasta, potatoes, corn, cereal, grits, crackers, bagels, muffins, all baked goods.  (Fruits, milk, and yogurt also have carbohydrate, but most of these foods will not spike your blood sugar as the starchy foods will.)  A few fruits do cause high blood sugars; use small portions of bananas (limit to 1/2 at a time), grapes, watermelon, oranges, and most tropical fruits.    Protein foods: Meat, fish, poultry, eggs, dairy foods, and beans such as pinto and kidney beans (beans also provide carbohydrate).   1. Eat at least 3 meals and 1-2 snacks per day. Never go more than 4-5 hours while awake without eating. Eat breakfast within the first hour of getting up.   2. Limit starchy foods to TWO per meal and ONE per snack. ONE portion of a starchy  food is equal to the following:   - ONE slice of bread (or its equivalent, such as half of a hamburger bun).   - 1/2 cup of a "scoopable" starchy food such as potatoes or rice.   - 15 grams of carbohydrate as shown on food label.  3. Include at every meal: a protein food, a carb food, and vegetables and/or fruit.   - Obtain twice the volume of veg's as protein or carbohydrate foods for both lunch and dinner.   - Fresh or frozen veg's are best.   - Keep frozen veg's on hand for a quick vegetable serving.     Snacks:  Whole-grain crackers (Triscuits), natural (oil on top; no sugar added) peanut butter, fruit, hard or cottage cheese, 1-2 tbsp nuts/seeds.     Let's plan on a follow-up appt in late-Sept or early October.  Please call to schedule:  [phone].

## 2015-08-31 NOTE — Progress Notes (Signed)
Medical Nutrition Therapy:  Appt start time: 1430 end time:  [id number]. PCP Mickey Farber, MD  Nephrologist Jaci Standard, MD ([facility])  Assessment:  Primary concerns today: Weight management and Blood sugar control (N18.3, R73.9, E78.2).   Kim Burns was referred by her PCP Mickey Farber, MD for pre-DM, and she also has a h/o hyperlipidemia and stage 3 renal disease.   Fasting glucose on 6/12 was 124; LDL was 136; HDL was 34, TG were 229.    Kim Burns lives alone, and said night-time snacks are sometimes tough to resist.  Often eats cheese, nuts, fruit, and occasionally sweets, but she's trying limit sweets recently.    Kim Burns has been checking fasting BG, which have been running 110s-120s.  Not currently taking any DM med's.    Learning Readiness: Change in progress  Usual eating pattern includes 3 light meals and 1(+) snacks per day. Frequent foods and beverages include black tea, water, black coffee, bread, egg, sausage patty/turkey bacon, salad, mixed greens, chx.  Avoided foods include milk, yogurt.   Usual physical activity includes vacuuming home pool daily (45-60 min).  24-hr recall: (Up at 8 AM) B (9:30 AM)-   1 slc toast, 1/2 tsp butter, water Snk ( AM)-   --- L (12 PM)-  (restaurant): 5 chx nuggets, 1/2 c broc, caulifl, & carr's, raw celery&carrots, water Snk ( PM)-  --- D (5:30 PM)-  1 c canned tom soup, toasted Amer chs sandw, blk tea Snk (10 PM)-  4 M&Ms Typical day? Yes.    Progress Towards Goal(s):  In progress.   Nutritional Diagnosis:  [place]-1.1 Food and nutrition-related knowledge deficit As related to blood glucose management.  As evidenced by patient's stated uncertainty about what foods are ok and not ok to include.    Intervention:  Education  Handouts given during visit include:  AVS  Demonstrated degree of understanding via:  Teach Back  Barriers to learning/adherence to lifestyle change: Evening snack times; eating out of boredom?  Monitoring/Evaluation:   Dietary intake, exercise, FBG, and body weight in 8 week(s).

## 2015-09-19 ENCOUNTER — Other Ambulatory Visit: Payer: Self-pay | Admitting: Otolaryngology

## 2015-09-19 ENCOUNTER — Other Ambulatory Visit: Payer: Self-pay | Admitting: Obstetrics and Gynecology

## 2015-09-19 DIAGNOSIS — H933X2 Disorders of left acoustic nerve: Secondary | ICD-10-CM

## 2015-09-25 ENCOUNTER — Ambulatory Visit: Payer: 59 | Admitting: Family Medicine

## 2015-09-28 ENCOUNTER — Ambulatory Visit
Admission: RE | Admit: 2015-09-28 | Discharge: 2015-09-28 | Disposition: A | Discharge status: Home / Self Care | Payer: Medicare Other | Source: Ambulatory Visit | Attending: Otolaryngology | Admitting: Otolaryngology

## 2015-09-28 DIAGNOSIS — H933X2 Disorders of left acoustic nerve: Secondary | ICD-10-CM

## 2015-11-09 ENCOUNTER — Other Ambulatory Visit: Payer: Self-pay | Admitting: Nephrology

## 2015-11-09 DIAGNOSIS — K8689 Other specified diseases of pancreas: Secondary | ICD-10-CM

## 2015-11-14 ENCOUNTER — Encounter: Payer: Self-pay | Admitting: Family Medicine

## 2015-11-14 ENCOUNTER — Ambulatory Visit (INDEPENDENT_AMBULATORY_CARE_PROVIDER_SITE_OTHER): Payer: Medicare Other | Admitting: Family Medicine

## 2015-11-14 DIAGNOSIS — N183 Chronic kidney disease, stage 3 unspecified: Secondary | ICD-10-CM

## 2015-11-14 DIAGNOSIS — E785 Hyperlipidemia, unspecified: Secondary | ICD-10-CM | POA: Diagnosis not present

## 2015-11-14 DIAGNOSIS — R739 Hyperglycemia, unspecified: Secondary | ICD-10-CM

## 2015-11-14 NOTE — Patient Instructions (Addendum)
-   If you have no luck in determining what's causing your night-time itching, you may want to consider making an appt with PharmD Dr. Nicholas Lose:  [phone].  - The best price in town for no-salt-added canned beans is [employer] (365 brand) by Erlene Quan on [address] going forward: 1. Continue relatively low-carb diet.  Keep in mind that it's ok (desirable) to include some carb at each meal.  Per last appt recommendations, allow yourself up to two starchy food portions per meal.  Also remember that the TYPE of carb you choose has an influence on your blood sugar.  When you eat any carb foods, the best are those with fiber.  Fruit:  Refer to your handout from last appt. 2. Consistent exercise:  See if you can increase your walking on the TM - or outside.  Give some thought as to someone to walk with, i.e., your church friends.   3. Keep up a consistent vegetable intake.  (This will help your blood pressure as well.)  If you decide you'd like another nutrition appt in the new year, ask Dr. Ernie Hew for a referral, and give me a call:  [phone].

## 2015-11-14 NOTE — Progress Notes (Signed)
Medical Nutrition Therapy:  Appt start time: 1330 end time:  1430. PCP Mickey Farber, MD  Nephrologist Jaci Standard, MD ([facility])  Assessment:  Primary concerns today: Weight management and Blood sugar control (N18.3, R73.9, E78.2).   Kim Burns's FBG has been at or under 100 since her last nutrition appt. BP has been 120s to 130s over 85 most of the time.  She is now going to exercise class at church once a week, which includes some walking and chair exercises.  Other exercise includes TM walking 10-15 min 3-4 X wk.  I encouraged her to increase walking some, including getting outside and with friends when possible.    Per Dr. Burman Foster and Dr. Ival Bible recommendations, [name] has discontinued atorvastatin, furosemide, and metoprolol.  [name] said her biggest challenge is finding low-Na foods in the grocery store.   24-hr recall:  (Up at 8:15 AM) B (9 AM)-  2 [place] bacon, 1 slc toast, 1/2 tsp butter, 1 egg yolk, black coffee  Snk ( AM)-   L (1 PM)-  1 salad, <2 T ranch drsng, 1 tbsp slc'd almonds, 2 oz chx, 7 str'ber's, blueberries, 1 kiwi, water Snk ( PM)-  --- D (6 PM)-  1 salmon patty, 1/2 c turnip greens, olive oil, 2/3 c pintos, 1 small swt potato, water Snk (8 PM)-  1 c homemade veg soup Typical day? Yes.    Progress Towards Goal(s):  In progress.   Nutritional Diagnosis: Progress noted on NB-1.1 Food and nutrition-related knowledge deficit As related to blood glucose management.  As evidenced by greatly improved BG control reflecting balanced food choices.    Intervention:  Nutrition education  Handouts given during visit include:  AVS  Demonstrated degree of understanding via:  Teach Back  Barriers to learning/adherence to lifestyle change: Evening snack times; eating out of boredom?  Monitoring/Evaluation:  Dietary intake, exercise, FBG, and body weight prn.  Patient's insurance covers only 2 MNT visits per year.

## 2015-11-20 ENCOUNTER — Ambulatory Visit
Admission: RE | Admit: 2015-11-20 | Discharge: 2015-11-20 | Disposition: A | Discharge status: Home / Self Care | Payer: Medicare Other | Source: Ambulatory Visit | Attending: Nephrology | Admitting: Nephrology

## 2015-11-20 DIAGNOSIS — K8689 Other specified diseases of pancreas: Secondary | ICD-10-CM

## 2016-01-17 ENCOUNTER — Encounter (HOSPITAL_COMMUNITY): Payer: Self-pay | Admitting: *Deleted

## 2016-01-19 ENCOUNTER — Other Ambulatory Visit: Payer: Self-pay | Admitting: Gastroenterology

## 2016-01-26 ENCOUNTER — Encounter (HOSPITAL_COMMUNITY): Payer: Self-pay | Admitting: *Deleted

## 2016-01-29 ENCOUNTER — Ambulatory Visit (INDEPENDENT_AMBULATORY_CARE_PROVIDER_SITE_OTHER): Payer: Medicare Other | Admitting: Neurology

## 2016-01-29 ENCOUNTER — Encounter: Payer: Self-pay | Admitting: Neurology

## 2016-01-29 VITALS — BP 140/88 | HR 76 | Resp 20 | Ht 62.0 in | Wt 151.0 lb

## 2016-01-29 DIAGNOSIS — Z8673 Personal history of transient ischemic attack (TIA), and cerebral infarction without residual deficits: Secondary | ICD-10-CM | POA: Diagnosis not present

## 2016-01-29 NOTE — Progress Notes (Signed)
Subjective:    Patient ID: Kim Burns is a 80 y.o. female.  HPI     Kim Age, MD, PhD [facility] [address], [address].O. [place], [place] [address]  Dear Kim Burns,  I saw your patient, Kim Burns, upon your kind request in my neurologic clinic today for initial consultation of her history of stroke or TIA. The patient is unaccompanied today. As you know, Kim Burns is a 81 year old right-handed woman with an underlying medical history of osteoporosis, hyperlipidemia, hyperparathyroidism, chronic kidney disease, gout, hypertension, history of acoustic neuroma on the left, for which she is followed by ENT, who has a prior history of TIA.   Per her Hx, she had a TIA in 1996. She was in [place], [place] at the time, at her daughter's house. Sx consisted of L arm weakness, L leg pain, L eye pain, slurring of speech - all lasting for a few seconds only, also had headache at the time. Did not seek immediate medical attention, PCP was in [place] at the time and patient had outpatient neuro check and was placed on ASA. She was stable since then, but PCP placed her on Plavix. She has been stable since then and on Plavix for years. She is a nonsmoker, no EtOH, no caffeine. She is retired, and widowed, lives alone. Has a FHx of stroke and Alzheirmer's.   I reviewed your office note from 01/22/2016, which you kindly included. She was recently found to have a pancreatic mass and is scheduled for a surgery for this. Her last brain MRI was in August 2017. She had a brain MRI without contrast on 09/28/2015 which I reviewed:IMPRESSION: 4.4 x 5.3 mm mass within the left internal auditory canal is stable and consistent with vestibular schwannoma. Cavernoma right parietal lobe stable.   She had an abdominal MRI on 11/20/2015 and I reviewed the results: IMPRESSION: 4.7 cm multiloculated cystic lesion in the pancreatic head/ uncinate process, increased. Progressive  pancreatic ductal dilatation, now measuring up to 14 mm.   Differential considerations include mucinous cystic neoplasm or mixed type IPMN with main duct involvement, both of which harbor malignant potential.   EUS with sampling is suggested. Given interval growth and progressive ductal dilatation, continued follow-up is of uncertain utility in the absence of planned intervention.  She is scheduled for esophageal endoscopic ultrasound from what I can see on 02/07/2016. She has been on generic Plavix. In addition, she takes fish oil, vitamin D 50,000 units once weekly, uloric, fenofibrate, calcitriol, telmisartan.  Her Past Medical History Is Significant For: Past Medical History:  Diagnosis Date  . Anemia of chronic disease 1985   hysterectomy fibroid tumors  . Bradycardia   . CKD (chronic kidney disease) stage 3, GFR 30-59 ml/min    sees dr Kim Burns sees next in jan 2018  . Dysrhythmia    bradycardia due to medication 6 yrs ago  . GERD (gastroesophageal reflux disease)   . Gout   . Headache   . History of kidney stones 06/2013  . Hyperparathyroidism ([place])   . Hypertension   . Itching    last year  . PONV (postoperative nausea and vomiting)   . Sleep apnea    no cpap machine. could not tolerate  . Thyroid disease   . TIA (transient ischemic attack) 1996   x 1  . Vitamin D deficiency     Her Past Surgical History Is Significant For: Past Surgical History:  Procedure Laterality Date  . ABDOMINAL  HYSTERECTOMY     complete  . APPENDECTOMY     with gallbladder  . BACK SURGERY     lower  . BREAST LUMPECTOMY Left x 2   many years apart, benign  . CHOLECYSTECTOMY    . CYSTOSCOPY WITH URETEROSCOPY AND STENT PLACEMENT Left 06/18/2013   Procedure: CYSTOSCOPY WITH Lef URETEROSCOPY AND Left STENT PLACEMENT;  Surgeon: Kim Gray, MD;  Location: [facility];  Service: Urology;  Laterality: Left;  . EYE SURGERY Bilateral 2014   ioc for catracts   . PARATHYROID EXPLORATION       Her Family History Is Significant For: Family History  Problem Relation Burns of Onset  . Heart disease Mother   . Stroke Mother   . Cancer Father     colon  . Alzheimer's disease Sister   . Cancer Brother     brain  . Alzheimer's disease Sister     Her Social History Is Significant For: Social History   Social History  . Marital status: Widowed    Spouse name: N/A  . Number of children: N/A  . Years of education: N/A   Social History Main Topics  . Smoking status: Never Smoker  . Smokeless tobacco: Never Used  . Alcohol use No  . Drug use: No  . Sexual activity: Not Asked   Other Topics Concern  . None   Social History Narrative  . None    Her Allergies Are:  Allergies  Allergen Reactions  . Codeine Other (See Comments)    headache  . Doxycycline   . Gemfibrozil Other (See Comments)    Myalgia   . Hydrocodone-Acetaminophen   . Lotemax [Loteprednol Etabonate]   . Statins Other (See Comments)    Muscle weakness  :   Her Current Medications Are:  Outpatient Encounter Prescriptions as of 01/29/2016  Medication Sig  . acetaminophen (TYLENOL) 500 MG tablet Take 1,000 mg by mouth every 6 (six) hours as needed for moderate pain.  . Artificial Tear Solution (GENTEAL TEARS OP) Apply 1 drop to eye 2 (two) times daily.  . calcitRIOL (ROCALTROL) 0.25 MCG capsule Take 0.25 mcg by mouth every Monday, Wednesday, and Friday. At night  . cinacalcet (SENSIPAR) 30 MG tablet Take 30 mg by mouth daily with supper.  . clopidogrel (PLAVIX) 75 MG tablet Take 75 mg by mouth every morning.  . diclofenac sodium (VOLTAREN) 1 % GEL Apply 2 g topically 4 (four) times daily as needed (hand pain).   Marland Kitchen diphenhydramine-acetaminophen (TYLENOL PM) 25-500 MG TABS Take 2 tablets by mouth at bedtime.   Marland Kitchen escitalopram (LEXAPRO) 5 MG tablet Take 5 mg by mouth at bedtime.  . Febuxostat (ULORIC) 80 MG TABS Take 80 mg by mouth every morning.   . fenofibrate (TRICOR) 145 MG tablet Take 145 mg by  mouth every morning.   . Omega-3 Fatty Acids (FISH OIL) 1200 MG CAPS Take 2,400 mg by mouth daily.  Marland Kitchen telmisartan (MICARDIS) 80 MG tablet Take 80 mg by mouth at bedtime.   . Vitamin D, Ergocalciferol, (DRISDOL) 50000 UNITS CAPS capsule Take 50,000 Units by mouth every 7 (seven) days. Wednesdays  . [DISCONTINUED] azelastine (ASTELIN) 0.1 % nasal spray Place 1-2 sprays into both nostrils daily as needed for rhinitis or allergies.    No facility-administered encounter medications on file as of 01/29/2016.   :   Review of Systems:  Out of a complete 14 point review of systems, all are reviewed and negative with the exception of these symptoms  as listed below:  Review of Systems  Neurological:       Pt presents today to discuss clearance for a whipple procedure. Pt says that she has had a TIA in the past and therefore has been on plavix for many years. Dr. Paulita Fujita will be doing the procedure but needs neurological clearance before proceeding with the procedure.    Objective:  Neurologic Exam  Physical Exam Physical Examination:   Vitals:   01/29/16 0805  BP: 140/88  Pulse: 76  Resp: 20    General Examination: The patient is a very pleasant 80 y.o. female in no acute distress. She appears well-developed and well-nourished and very well groomed.   HEENT: Normocephalic, atraumatic, pupils are equal, round and reactive to light and accommodation. Funduscopic exam is normal with sharp disc margins noted. Extraocular tracking is good without limitation to gaze excursion or nystagmus noted. Normal smooth pursuit is noted. Hearing is grossly intact. Tympanic membranes are clear bilaterally. Face is symmetric with normal facial animation and normal facial sensation. Speech is clear with no dysarthria noted. There is no hypophonia. There is no lip, neck/head, jaw or voice tremor. Neck is supple with full range of passive and active motion. There are no carotid bruits on auscultation. Oropharynx exam  reveals: mild mouth dryness, adequate dental hygiene and mild airway crowding, due to redundant soft palate. Mallampati is class II. Tongue protrudes centrally and palate elevates symmetrically.    Chest: Clear to auscultation without wheezing, rhonchi or crackles noted.  Heart: S1+S2+0, regular and normal without murmurs, rubs or gallops noted.   Abdomen: Soft, non-tender and non-distended with normal bowel sounds appreciated on auscultation.  Extremities: There is no pitting edema in the distal lower extremities bilaterally. Pedal pulses are intact.  Skin: Warm and dry without trophic changes noted. There are no obvious varicose veins.  Musculoskeletal: exam reveals no obvious joint deformities, tenderness or joint swelling or erythema.   Neurologically:  Mental status: The patient is awake, alert and oriented in all 4 spheres. Her immediate and remote memory, attention, language skills and fund of knowledge are appropriate. There is no evidence of aphasia, agnosia, apraxia or anomia. Speech is clear with normal prosody and enunciation. Thought process is linear. Mood is normal and affect is normal.  Cranial nerves II - XII are as described above under HEENT exam. In addition: shoulder shrug is normal with equal shoulder height noted. Motor exam: Normal bulk, strength and tone is noted. There is no drift, tremor or rebound. Romberg is negative. Reflexes are 2+ throughout. Babinski: Toes are flexor bilaterally. Fine motor skills and coordination: intact with normal finger taps, normal hand movements, normal rapid alternating patting, normal foot taps and normal foot agility.  Cerebellar testing: No dysmetria or intention tremor on finger to nose testing. Heel to shin is unremarkable bilaterally. There is no truncal or gait ataxia.  Sensory exam: intact to light touch, pinprick, vibration, temperature sense in the upper and lower extremities.  Gait, station and balance: She stands easily. No  veering to one side is noted. No leaning to one side is noted. Posture is Burns-appropriate and stance is narrow based. Gait shows normal stride length and normal pace. No problems turning are noted. Tandem walk is slightly challenging for her, not unusual for Burns.                Assessment and Plan:  :  In summary, Kim Burns is a very pleasant 80 y.o.-year old female with an  underlying medical history of osteoporosis, hyperlipidemia, hyperparathyroidism, chronic kidney disease, gout, hypertension, history of acoustic neuroma on the left, for which she is followed by ENT, who has a prior history of TIA. She reports a TIA many years ago, was placed on Plavix in 1999 or 2000, has been stable since then. Her exam is non focal, which is reassuring. She looks rather well from the neurological standpoint, I am pleased to see. She has a surgical procedure planned for later this month. She is advised, that she can come off of the Plavix as advised by her surgeon for as long as required and can restart routinely after the procedure.   I had a long chat with the patient about my findings and the diagnosis of TIA, the prognosis and treatment options. We talked about medical treatments and non-pharmacological approaches. We talked about maintaining a healthy lifestyle in general. I encouraged the patient to eat healthy, exercise daily and keep well hydrated, to keep a scheduled bedtime and wake time routine, to not skip any meals and eat healthy snacks in between meals and to have protein with every meal.   I would be happy to see her back as needed.  I answered all her questions today and the patient was in agreement.  Thank you very much for allowing me to participate in the care of this nice patient. If I can be of any further assistance to you please do not hesitate to call me at [phone].  Sincerely,   Kim Age, MD, PhD

## 2016-01-29 NOTE — Patient Instructions (Signed)
For your planned procedure, you can come off the plavix as instructed and restart, when deemed safe by your surgeon.  You exam looks good, your symptoms have been stable.  I will see you back as needed.

## 2016-02-06 ENCOUNTER — Other Ambulatory Visit: Payer: Self-pay | Admitting: Gastroenterology

## 2016-02-07 ENCOUNTER — Encounter (HOSPITAL_COMMUNITY): Payer: Self-pay

## 2016-02-07 ENCOUNTER — Ambulatory Visit (HOSPITAL_COMMUNITY): Payer: Medicare Other | Admitting: Anesthesiology

## 2016-02-07 ENCOUNTER — Encounter (HOSPITAL_COMMUNITY)
Admission: RE | Disposition: A | Discharge status: Home / Self Care | Payer: Self-pay | Source: Ambulatory Visit | Attending: Gastroenterology

## 2016-02-07 ENCOUNTER — Ambulatory Visit (HOSPITAL_COMMUNITY)
Admission: RE | Admit: 2016-02-07 | Discharge: 2016-02-07 | Disposition: A | Discharge status: Home / Self Care | Payer: Medicare Other | Source: Ambulatory Visit | Attending: Gastroenterology | Admitting: Gastroenterology

## 2016-02-07 DIAGNOSIS — G4730 Sleep apnea, unspecified: Secondary | ICD-10-CM | POA: Insufficient documentation

## 2016-02-07 DIAGNOSIS — K862 Cyst of pancreas: Secondary | ICD-10-CM | POA: Insufficient documentation

## 2016-02-07 DIAGNOSIS — K219 Gastro-esophageal reflux disease without esophagitis: Secondary | ICD-10-CM | POA: Diagnosis not present

## 2016-02-07 DIAGNOSIS — I10 Essential (primary) hypertension: Secondary | ICD-10-CM | POA: Diagnosis not present

## 2016-02-07 DIAGNOSIS — K8689 Other specified diseases of pancreas: Secondary | ICD-10-CM | POA: Diagnosis not present

## 2016-02-07 HISTORY — DX: Vitamin D deficiency, unspecified: E55.9

## 2016-02-07 HISTORY — DX: Cardiac arrhythmia, unspecified: I49.9

## 2016-02-07 HISTORY — DX: Pruritus, unspecified: L29.9

## 2016-02-07 HISTORY — PX: EUS: SHX5427

## 2016-02-07 HISTORY — DX: Nausea with vomiting, unspecified: R11.2

## 2016-02-07 HISTORY — DX: Headache, unspecified: R51.9

## 2016-02-07 HISTORY — DX: Headache: R51

## 2016-02-07 HISTORY — DX: Cerebral infarction, unspecified: I63.9

## 2016-02-07 HISTORY — DX: Other specified postprocedural states: Z98.890

## 2016-02-07 HISTORY — DX: Gastro-esophageal reflux disease without esophagitis: K21.9

## 2016-02-07 SURGERY — ESOPHAGEAL ENDOSCOPIC ULTRASOUND (EUS) RADIAL
Anesthesia: Monitor Anesthesia Care

## 2016-02-07 MED ORDER — PROPOFOL 10 MG/ML IV BOLUS
INTRAVENOUS | Status: AC
  Filled 2016-02-07: qty 40

## 2016-02-07 MED ORDER — CIPROFLOXACIN IN D5W 400 MG/200ML IV SOLN
INTRAVENOUS | Status: AC
  Filled 2016-02-07: qty 200

## 2016-02-07 MED ORDER — SODIUM CHLORIDE 0.9 % IV SOLN
INTRAVENOUS | Status: DC
  Administered 2016-02-07: 10:00:00 via INTRAVENOUS

## 2016-02-07 MED ORDER — LACTATED RINGERS IV SOLN
INTRAVENOUS | Status: DC
  Administered 2016-02-07: 10:00:00 via INTRAVENOUS

## 2016-02-07 MED ORDER — SODIUM CHLORIDE 0.9 % IV SOLN: INTRAVENOUS | Status: DC

## 2016-02-07 MED ORDER — PROPOFOL 10 MG/ML IV BOLUS
INTRAVENOUS | Status: DC | PRN
  Administered 2016-02-07 (×2): 20 mg via INTRAVENOUS
  Administered 2016-02-07 (×2): 10 mg via INTRAVENOUS
  Administered 2016-02-07: 30 mg via INTRAVENOUS
  Administered 2016-02-07 (×4): 10 mg via INTRAVENOUS
  Administered 2016-02-07: 20 mg via INTRAVENOUS

## 2016-02-07 NOTE — Discharge Instructions (Signed)
Endoscopy °Care After °Please read the instructions outlined below and refer to this sheet in the next few weeks. These discharge instructions provide you with general information on caring for yourself after you leave the hospital. Your doctor may also give you specific instructions. While your treatment has been planned according to the most current medical practices available, unavoidable complications occasionally occur. If you have any problems or questions after discharge, please call Dr. Skai Lickteig ([facility]) at [phone]. ° °HOME CARE INSTRUCTIONS °Activity °· You may resume your regular activity but move at a slower pace for the next 24 hours.  °· Take frequent rest periods for the next 24 hours.  °· Walking will help expel (get rid of) the air and reduce the bloated feeling in your abdomen.  °· No driving for 24 hours (because of the anesthesia (medicine) used during the test).  °· You may shower.  °· Do not sign any important legal documents or operate any machinery for 24 hours (because of the anesthesia used during the test).  °Nutrition °· Drink plenty of fluids.  °· You may resume your normal diet.  °· Begin with a light meal and progress to your normal diet.  °· Avoid alcoholic beverages for 24 hours or as instructed by your caregiver.  °Medications °You may resume your normal medications unless your caregiver tells you otherwise. °What you can expect today °· You may experience abdominal discomfort such as a feeling of fullness or "gas" pains.  °· You may experience a sore throat for 2 to 3 days. This is normal. Gargling with salt water may help this.  °·  °SEEK IMMEDIATE MEDICAL CARE IF: °· You have excessive nausea (feeling sick to your stomach) and/or vomiting.  °· You have severe abdominal pain and distention (swelling).  °· You have trouble swallowing.  °· You have a temperature over 100° F (37.8° C).  °· You have rectal bleeding or vomiting of blood.  °Document Released:  09/19/2003 Document Revised: 10/17/2010 Document Reviewed: 04/01/2007 °ExitCare® Patient Information ©2012 [employer]. °

## 2016-02-07 NOTE — Transfer of Care (Signed)
Immediate Anesthesia Transfer of Care Note  Patient: Kim Burns  Procedure(s) Performed: Procedure(s): ESOPHAGEAL ENDOSCOPIC ULTRASOUND (EUS) RADIAL (N/A)  Patient Location: PACU  Anesthesia Type:MAC  Level of Consciousness: sedated  Airway & Oxygen Therapy: Patient Spontanous Breathing and Patient connected to nasal cannula oxygen  Post-op Assessment: Report given to RN and Post -op Vital signs reviewed and stable  Post vital signs: Reviewed and stable  Last Vitals:  Vitals:   02/07/16 1000  Pulse: 69  Resp: 11  Temp: 36.7 C    Last Pain:  Vitals:   02/07/16 1000  TempSrc: Oral         Complications: No apparent anesthesia complications

## 2016-02-07 NOTE — H&P (Signed)
Patient interval history reviewed.  Patient examined again.  There has been no change from documented H/P dated 01/09/16 (scanned into chart from our office) except as documented above.  Assessment:  1.  Pancreatic cyst.  Plan:  1.  Upper endoscopic ultrasound with possible pancreatic cyst aspiration. 2.  Risks (bleeding, infection, bowel perforation that could require surgery, sedation-related changes in cardiopulmonary systems), benefits (identification and possible treatment of source of symptoms, exclusion of certain causes of symptoms), and alternatives (watchful waiting, radiographic imaging studies, empiric medical treatment) of upper endoscopy with ultrasound and possible cyst aspiration/biopsy (EUS +/- FNA) were explained to patient/family in detail and patient wishes to proceed.

## 2016-02-07 NOTE — Op Note (Signed)
[facility] Patient Name: Kim Burns Procedure Date: 02/07/2016 MRN: [id number] Attending MD: Arta Silence , MD Date of Birth: [birth year] CSN: [id number] Age: 80 Admit Type: Outpatient Procedure:                Upper EUS Indications:              Pancreatic cystic neoplasm Providers:                Arta Silence, MD, Carolynn Comment, RN, Elspeth Cho Tech., Technician, Derrek Gu. Alday CRNA,                            CRNA Referring MD:             Stark Klein, MD Medicines:                Monitored Anesthesia Care Complications:            No immediate complications. Estimated Blood Loss:     Estimated blood loss: none. Procedure:                Pre-Anesthesia Assessment:                           - Prior to the procedure, a History and Physical                            was performed, and patient medications and                            allergies were reviewed. The patient's tolerance of                            previous anesthesia was also reviewed. The risks                            and benefits of the procedure and the sedation                            options and risks were discussed with the patient.                            All questions were answered, and informed consent                            was obtained. Prior Anticoagulants: The patient has                            taken Plavix (clopidogrel), last dose was 6 days                            prior to procedure. ASA Grade Assessment: III - A  patient with severe systemic disease. After                            reviewing the risks and benefits, the patient was                            deemed in satisfactory condition to undergo the                            procedure.                           After obtaining informed consent, the endoscope was                            passed under direct vision. Throughout the                     procedure, the patient's blood pressure, pulse, and                            oxygen saturations were monitored continuously. The                            [id number] ([id number]) scope was introduced through                            the mouth, and advanced to the second part of                            duodenum. The upper EUS was accomplished without                            difficulty. The patient tolerated the procedure                            well. Scope In: Scope Out: Findings:      Endosonographic Finding :      An anechoic, multicystic and septated lesion suggestive of a cyst was       identified in the pancreatic head and in the uncinate process of the       pancreas. It is not in obvious communication with the pancreatic duct.       The lesion measured 30 mm by 40 mm in maximal cross-sectional diameter.       There were many compartments thinly septated. The outer wall of the       lesion was not seen. There was no associated mass. There was no internal       debris within the fluid-filled cavity. The pancreatic duct was dilated       to about 41mm in size. Cyst directly adjacent to the portal confluence,       at intersection of PV/SMV/SV. There was appearance of a stone within the       pancreatic duct at the level of the head of pancreas. There was no       window available for cyst aspiration that wouldn't involve puncture  through the pancreas or large neighboring vasculature, thus cyst       aspiration not done. Impression:               - A cystic lesion was seen in the pancreatic head                            and in the uncinate process of the pancreas. Most                            consistent with either mixed type IPMN or mucinous                            cystadenoma. Cyst aspiration not possible, for                            reasons as above.                           - Pancreatic ductal dilatation.                           -  Stone in pancreatic duct.                           - No obvious features of cyst to suggest impending                            malignancy. Moderate Sedation:      None Recommendation:           - Discharge patient to home (via wheelchair).                           - Resume previous diet today.                           - Resume Plavix (clopidogrel) at prior dose today.                           - Return to referring physician as previously                            scheduled. Procedure Code(s):        --- Professional ---                           [phone], Esophagogastroduodenoscopy, flexible,                            transoral; with endoscopic ultrasound examination                            limited to the esophagus, stomach or duodenum, and                            adjacent structures Diagnosis Code(s):        ---  Professional ---                           K86.2, Cyst of pancreas                           D49.0, Neoplasm of unspecified behavior of                            digestive system CPT copyright 2016 American Medical Association. All rights reserved. The codes documented in this report are preliminary and upon coder review may  be revised to meet current compliance requirements. Arta Silence, MD 02/07/2016 11:03:41 AM This report has been signed electronically. Number of Addenda: 0

## 2016-02-07 NOTE — Anesthesia Postprocedure Evaluation (Signed)
Anesthesia Post Note  Patient: TIANDRA SWOVELAND  Procedure(s) Performed: Procedure(s) (LRB): ESOPHAGEAL ENDOSCOPIC ULTRASOUND (EUS) RADIAL (N/A)  Patient location during evaluation: PACU Anesthesia Type: MAC Level of consciousness: awake and alert Pain management: pain level controlled Vital Signs Assessment: post-procedure vital signs reviewed and stable Respiratory status: spontaneous breathing, nonlabored ventilation, respiratory function stable and patient connected to nasal cannula oxygen Cardiovascular status: stable and blood pressure returned to baseline Anesthetic complications: no       Last Vitals:  Vitals:   02/07/16 1000 02/07/16 1052  BP:  137/69  Pulse: 69 74  Resp: 11 (!) 29  Temp: 36.7 C     Last Pain:  Vitals:   02/07/16 1000  TempSrc: Oral                 Kirklin Mcduffee DAVID

## 2016-02-07 NOTE — Anesthesia Preprocedure Evaluation (Addendum)
Anesthesia Evaluation  Patient identified by MRN, date of birth, ID band Patient awake    Reviewed: Allergy & Precautions, NPO status , Patient's Chart, lab work & pertinent test results  History of Anesthesia Complications (+) PONV  Airway Mallampati: I  TM Distance: >3 FB Neck ROM: Full    Dental   Pulmonary sleep apnea ,    Pulmonary exam normal        Cardiovascular hypertension, Pt. on medications Normal cardiovascular exam     Neuro/Psych    GI/Hepatic GERD  Medicated and Controlled,  Endo/Other    Renal/GU Renal InsufficiencyRenal disease     Musculoskeletal   Abdominal   Peds  Hematology   Anesthesia Other Findings   Reproductive/Obstetrics                            Anesthesia Physical Anesthesia Plan  ASA: II  Anesthesia Plan: MAC   Post-op Pain Management:    Induction: Intravenous  Airway Management Planned: Simple Face Mask  Additional Equipment:   Intra-op Plan:   Post-operative Plan: Extubation in OR  Informed Consent: I have reviewed the patients History and Physical, chart, labs and discussed the procedure including the risks, benefits and alternatives for the proposed anesthesia with the patient or authorized representative who has indicated his/her understanding and acceptance.     Plan Discussed with: CRNA and Surgeon  Anesthesia Plan Comments:         Anesthesia Quick Evaluation

## 2016-02-08 ENCOUNTER — Encounter (HOSPITAL_COMMUNITY): Payer: Self-pay | Admitting: Gastroenterology

## 2016-02-19 HISTORY — PX: HERNIA REPAIR: SHX51

## 2016-02-26 ENCOUNTER — Other Ambulatory Visit: Payer: Self-pay | Admitting: General Surgery

## 2016-03-05 ENCOUNTER — Other Ambulatory Visit: Payer: Self-pay | Admitting: General Surgery

## 2016-03-18 ENCOUNTER — Other Ambulatory Visit: Payer: Self-pay | Admitting: General Surgery

## 2016-04-10 ENCOUNTER — Other Ambulatory Visit: Payer: Self-pay | Admitting: General Surgery

## 2016-04-15 NOTE — Pre-Procedure Instructions (Signed)
Kim Burns  04/15/2016      [facility] - [place], [place] [address] Phone: [phone] Fax: [phone]  [id number] - [place], [place] - [address] [place] [address] Phone: [phone] Fax: [place] [address], [place] - [address] [place] [place] [address] Phone: [phone] Fax: [place], [place] [facility] [address] [place] [address] [place] [address] Phone: [phone] Fax: [phone]    Your procedure is scheduled on Tues, Mar 6 @ 7:30 AM  Report to [facility] at 5:30 AM  Call this number if you have problems the morning of surgery:  [phone]   Remember:  Do not eat food or drink liquids after midnight.  Take these medicines the morning of surgery with A SIP OF WATER Eye Drops,Nasal Spray, and Uloric(Febuxostat)              Hold your Plavix. No Goody's,BC's,Aleve,Advil,Motrin,Ibuprofen,Fish Oil,or any Herbal Medications.    Do not wear jewelry, make-up or nail polish.  Do not wear lotions, powders,perfumes, or deoderant.  Do not shave 48 hours prior to surgery.    Do not bring valuables to the hospital.  [facility] is not responsible for any belongings or valuables.  Contacts, dentures or bridgework may not be worn into surgery.  Leave your suitcase in the car.  After surgery it may be brought to your room.  For patients admitted to the hospital, discharge time will be determined by your treatment team.  Patients discharged the day of surgery will not be allowed to drive home.    [facility]  Before surgery, you can play an important role.  Because skin is not sterile, your skin needs to be as free of germs as possible.  You  can reduce the number of germs on you skin by washing with CHG (chlorahexidine gluconate) soap before surgery.  CHG is an antiseptic cleaner which kills germs and bonds with the skin to continue killing germs even after washing.  Please DO NOT use if you have an allergy to CHG or antibacterial soaps.  If your skin becomes reddened/irritated stop using the CHG and inform your nurse when you arrive at Short Stay.  Do not shave (including legs and underarms) for at least 48 hours prior to the first CHG shower.  You may shave your face.  Please follow these instructions carefully:   1.  Shower with CHG Soap the night before surgery and the                                morning of Surgery.  2.  If you choose to wash your hair, wash your hair first as usual with your       normal shampoo.  3.  After you shampoo, rinse your hair and body thoroughly to remove the                      Shampoo.  4.  Use CHG as you would any other liquid soap.  You can apply chg directly       to the skin and wash gently with scrungie or  a clean washcloth.  5.  Apply the CHG Soap to your body ONLY FROM THE NECK DOWN.        Do not use on open wounds or open sores.  Avoid contact with your eyes,       ears, mouth and genitals (private parts).  Wash genitals (private parts)       with your normal soap.  6.  Wash thoroughly, paying special attention to the area where your surgery        will be performed.  7.  Thoroughly rinse your body with warm water from the neck down.  8.  DO NOT shower/wash with your normal soap after using and rinsing off       the CHG Soap.  9.  Pat yourself dry with a clean towel.            10.  Wear clean pajamas.            11.  Place clean sheets on your bed the night of your first shower and do not        sleep with pets.  Day of Surgery  Do not apply any lotions/deoderants the morning of surgery.  Please wear clean clothes to the hospital/surgery center.     Please read over the following  fact sheets that you were given. Pain Booklet, Coughing and Deep Breathing, MRSA Information and Surgical Site Infection Prevention  [facility] - Preparing for Surgery  Before surgery, you can play an important role.  Because skin is not sterile, your skin needs to be as free of germs as possible.  You can reduce the number of germs on you skin by washing with CHG (chlorahexidine gluconate) soap before surgery.  CHG is an antiseptic cleaner which kills germs and bonds with the skin to continue killing germs even after washing.  Please DO NOT use if you have an allergy to CHG or antibacterial soaps.  If your skin becomes reddened/irritated stop using the CHG and inform your nurse when you arrive at Short Stay.  Do not shave (including legs and underarms) for at least 48 hours prior to the first CHG shower.  You may shave your face.  Please follow these instructions carefully:   1.  Shower with CHG Soap the night before surgery and the                                morning of Surgery.  2.  If you choose to wash your hair, wash your hair first as usual with your       normal shampoo.  3.  After you shampoo, rinse your hair and body thoroughly to remove the                      Shampoo.  4.  Use CHG as you would any other liquid soap.  You can apply chg directly       to the skin and wash gently with scrungie or a clean washcloth.  5.  Apply the CHG Soap to your body ONLY FROM THE NECK DOWN.        Do not use on open wounds or open sores.  Avoid contact with your eyes,       ears, mouth and genitals (private parts).  Wash genitals (private parts)       with your normal soap.  6.  Wash  thoroughly, paying special attention to the area where your surgery        will be performed.  7.  Thoroughly rinse your body with warm water from the neck down.  8.  DO NOT shower/wash with your normal soap after using and rinsing off       the CHG Soap.  9.  Pat yourself dry with a clean towel.            10.  Wear  clean pajamas.            11.  Place clean sheets on your bed the night of your first shower and do not        sleep with pets.  Day of Surgery  Do not apply any lotions/deoderants the morning of surgery.  Please wear clean clothes to the hospital/surgery center.

## 2016-04-16 ENCOUNTER — Encounter (HOSPITAL_COMMUNITY)
Admission: RE | Admit: 2016-04-16 | Discharge: 2016-04-16 | Disposition: A | Discharge status: Home / Self Care | Payer: Medicare Other | Source: Ambulatory Visit | Attending: General Surgery | Admitting: General Surgery

## 2016-04-16 ENCOUNTER — Other Ambulatory Visit: Payer: Self-pay

## 2016-04-16 ENCOUNTER — Encounter (HOSPITAL_COMMUNITY): Payer: Self-pay

## 2016-04-16 DIAGNOSIS — I10 Essential (primary) hypertension: Secondary | ICD-10-CM | POA: Insufficient documentation

## 2016-04-16 DIAGNOSIS — Z8673 Personal history of transient ischemic attack (TIA), and cerebral infarction without residual deficits: Secondary | ICD-10-CM | POA: Insufficient documentation

## 2016-04-16 DIAGNOSIS — Z01812 Encounter for preprocedural laboratory examination: Secondary | ICD-10-CM | POA: Diagnosis present

## 2016-04-16 DIAGNOSIS — Z01810 Encounter for preprocedural cardiovascular examination: Secondary | ICD-10-CM | POA: Insufficient documentation

## 2016-04-16 DIAGNOSIS — I499 Cardiac arrhythmia, unspecified: Secondary | ICD-10-CM

## 2016-04-16 HISTORY — DX: Cardiac arrhythmia, unspecified: I49.9

## 2016-04-16 LAB — CBC WITH DIFFERENTIAL/PLATELET
Basophils Absolute: 0 10*3/uL (ref 0.0–0.1)
Basophils Relative: 1 %
EOS ABS: 0.1 10*3/uL (ref 0.0–0.7)
EOS PCT: 2 %
HCT: 35.9 % — ABNORMAL LOW (ref 36.0–46.0)
Hemoglobin: 11.4 g/dL — ABNORMAL LOW (ref 12.0–15.0)
LYMPHS ABS: 2.6 10*3/uL (ref 0.7–4.0)
LYMPHS PCT: 35 %
MCH: 28.8 pg (ref 26.0–34.0)
MCHC: 31.8 g/dL (ref 30.0–36.0)
MCV: 90.7 fL (ref 78.0–100.0)
MONO ABS: 0.4 10*3/uL (ref 0.1–1.0)
MONOS PCT: 5 %
Neutro Abs: 4.2 10*3/uL (ref 1.7–7.7)
Neutrophils Relative %: 57 %
PLATELETS: 285 10*3/uL (ref 150–400)
RBC: 3.96 MIL/uL (ref 3.87–5.11)
RDW: 14.9 % (ref 11.5–15.5)
WBC: 7.4 10*3/uL (ref 4.0–10.5)

## 2016-04-16 LAB — PROTIME-INR
INR: 0.96
PROTHROMBIN TIME: 12.8 s (ref 11.4–15.2)

## 2016-04-16 LAB — COMPREHENSIVE METABOLIC PANEL
ALT: 17 U/L (ref 14–54)
ANION GAP: 10 (ref 5–15)
AST: 21 U/L (ref 15–41)
Albumin: 3.9 g/dL (ref 3.5–5.0)
Alkaline Phosphatase: 23 U/L — ABNORMAL LOW (ref 38–126)
BUN: 49 mg/dL — ABNORMAL HIGH (ref 6–20)
CHLORIDE: 107 mmol/L (ref 101–111)
CO2: 24 mmol/L (ref 22–32)
CREATININE: 2.94 mg/dL — AB (ref 0.44–1.00)
Calcium: 9.2 mg/dL (ref 8.9–10.3)
GFR, EST AFRICAN AMERICAN: 17 mL/min — AB (ref >60)
GFR, EST NON AFRICAN AMERICAN: 14 mL/min — AB (ref >60)
Glucose, Bld: 86 mg/dL (ref 65–99)
POTASSIUM: 4 mmol/L (ref 3.5–5.1)
Sodium: 141 mmol/L (ref 135–145)
Total Bilirubin: 1 mg/dL (ref 0.3–1.2)
Total Protein: 6.8 g/dL (ref 6.5–8.1)

## 2016-04-16 LAB — PREPARE RBC (CROSSMATCH)

## 2016-04-16 LAB — ABO/RH: ABO/RH(D): A POS

## 2016-04-16 MED ORDER — CHLORHEXIDINE GLUCONATE CLOTH 2 % EX PADS: 6.0000 | MEDICATED_PAD | Freq: Once | CUTANEOUS | Status: DC

## 2016-04-16 NOTE — Progress Notes (Signed)
PCP: Dr. Rachell Cipro  Cardiologist: pt denies  EKG: pt denies past year  Stress test: pt denies   ECHO: 2015 in EPIC, denies past yr  Chest x-ray:pt denies past year  Cardiac cath: pt denies

## 2016-04-17 NOTE — Progress Notes (Addendum)
Anesthesia Chart Review:  Pt is a 81 year old female scheduled for whipple procedure for pancreatic head mass on 04/23/2016 with Stark Klein, MD.   - PCP is Rachell Cipro, MD.  - Nephrologist is Donato Heinz, M.D, last office visit 03/18/16. Notes indicate he is aware of upcoming surgery.   PMH includes:  HTN, stroke, TIA, bradycardia, thyroid disease, hyperparathyroidism sleep apnea (no CPAP), CK D (stage 4), anemia, postop N/V, GERD. Never smoker. BMI 27  Medications include: Plavix, fenofibrate, telmisartan. Per Dr. Elissa Hefty notes, pt will discontinue telmistartan 04/20/16 and start hydralazine 25mg  TID.   Preoperative labs reviewed.   - Cr 2.94, BUN 49. Cr ranges 2.3 to 2.8 per Dr. Elissa Hefty notes.  EKG 04/16/16: NSR. Low voltage QRS. Possible inferior infarct, age undetermined. Inferior Q waves are new since prior tracing 06/18/13.  Echo 06/19/13:  - Left ventricle: The cavity size was normal. There was severe focal basal and moderate concentric hypertrophy. Systolic function was normal. The estimated ejection fraction was in the range of 60% to 65%. Wall motion was normal; there were no regional wall motion abnormalities. - Aortic valve: Valve area: 1.69cm^2 (Vmax). - Mitral valve: Mild to moderate regurgitation. - Left atrium: The atrium was mildly dilated. - Atrial septum: There was increased thickness of the septum, consistent with lipomatous hypertrophy.  Reviewed case with Dr. Conrad Quapaw.   If no changes, I anticipate pt can proceed with surgery as scheduled.   Willeen Cass, FNP-BC [facility] Short Stay Surgical Center/Anesthesiology Phone: [phone] 04/22/2016 10:53 AM

## 2016-04-23 ENCOUNTER — Encounter (HOSPITAL_COMMUNITY): Payer: Self-pay | Admitting: Urology

## 2016-04-23 ENCOUNTER — Encounter (HOSPITAL_COMMUNITY)
Admission: RE | Disposition: A | Discharge status: Skilled Nursing Facility | Payer: Self-pay | Source: Ambulatory Visit | Attending: General Surgery

## 2016-04-23 ENCOUNTER — Inpatient Hospital Stay (HOSPITAL_COMMUNITY): Payer: Medicare Other | Admitting: Emergency Medicine

## 2016-04-23 ENCOUNTER — Inpatient Hospital Stay (HOSPITAL_COMMUNITY)
Admission: RE | Admit: 2016-04-23 | Discharge: 2016-05-06 | DRG: 406 | Disposition: A | Discharge status: Skilled Nursing Facility | Payer: Medicare Other | Source: Ambulatory Visit | Attending: General Surgery | Admitting: General Surgery

## 2016-04-23 ENCOUNTER — Inpatient Hospital Stay (HOSPITAL_COMMUNITY): Payer: Medicare Other | Admitting: Anesthesiology

## 2016-04-23 DIAGNOSIS — Z7902 Long term (current) use of antithrombotics/antiplatelets: Secondary | ICD-10-CM

## 2016-04-23 DIAGNOSIS — K210 Gastro-esophageal reflux disease with esophagitis: Secondary | ICD-10-CM | POA: Diagnosis present

## 2016-04-23 DIAGNOSIS — N183 Chronic kidney disease, stage 3 unspecified: Secondary | ICD-10-CM | POA: Diagnosis present

## 2016-04-23 DIAGNOSIS — E891 Postprocedural hypoinsulinemia: Secondary | ICD-10-CM | POA: Diagnosis not present

## 2016-04-23 DIAGNOSIS — N281 Cyst of kidney, acquired: Secondary | ICD-10-CM | POA: Diagnosis present

## 2016-04-23 DIAGNOSIS — K429 Umbilical hernia without obstruction or gangrene: Secondary | ICD-10-CM | POA: Diagnosis present

## 2016-04-23 DIAGNOSIS — E876 Hypokalemia: Secondary | ICD-10-CM | POA: Diagnosis not present

## 2016-04-23 DIAGNOSIS — Z888 Allergy status to other drugs, medicaments and biological substances status: Secondary | ICD-10-CM

## 2016-04-23 DIAGNOSIS — E875 Hyperkalemia: Secondary | ICD-10-CM | POA: Diagnosis not present

## 2016-04-23 DIAGNOSIS — D136 Benign neoplasm of pancreas: Secondary | ICD-10-CM | POA: Diagnosis present

## 2016-04-23 DIAGNOSIS — K219 Gastro-esophageal reflux disease without esophagitis: Secondary | ICD-10-CM

## 2016-04-23 DIAGNOSIS — K660 Peritoneal adhesions (postprocedural) (postinfection): Secondary | ICD-10-CM | POA: Diagnosis present

## 2016-04-23 DIAGNOSIS — D62 Acute posthemorrhagic anemia: Secondary | ICD-10-CM | POA: Diagnosis not present

## 2016-04-23 DIAGNOSIS — G4730 Sleep apnea, unspecified: Secondary | ICD-10-CM | POA: Diagnosis present

## 2016-04-23 DIAGNOSIS — Z90710 Acquired absence of both cervix and uterus: Secondary | ICD-10-CM

## 2016-04-23 DIAGNOSIS — Z885 Allergy status to narcotic agent status: Secondary | ICD-10-CM | POA: Diagnosis not present

## 2016-04-23 DIAGNOSIS — M109 Gout, unspecified: Secondary | ICD-10-CM | POA: Diagnosis present

## 2016-04-23 DIAGNOSIS — R739 Hyperglycemia, unspecified: Secondary | ICD-10-CM | POA: Diagnosis present

## 2016-04-23 DIAGNOSIS — D638 Anemia in other chronic diseases classified elsewhere: Secondary | ICD-10-CM | POA: Diagnosis present

## 2016-04-23 DIAGNOSIS — Z8673 Personal history of transient ischemic attack (TIA), and cerebral infarction without residual deficits: Secondary | ICD-10-CM

## 2016-04-23 DIAGNOSIS — Z79899 Other long term (current) drug therapy: Secondary | ICD-10-CM

## 2016-04-23 DIAGNOSIS — R5381 Other malaise: Secondary | ICD-10-CM | POA: Diagnosis not present

## 2016-04-23 DIAGNOSIS — K869 Disease of pancreas, unspecified: Secondary | ICD-10-CM | POA: Diagnosis present

## 2016-04-23 DIAGNOSIS — D490 Neoplasm of unspecified behavior of digestive system: Secondary | ICD-10-CM | POA: Diagnosis present

## 2016-04-23 DIAGNOSIS — R51 Headache: Secondary | ICD-10-CM | POA: Diagnosis not present

## 2016-04-23 DIAGNOSIS — I129 Hypertensive chronic kidney disease with stage 1 through stage 4 chronic kidney disease, or unspecified chronic kidney disease: Secondary | ICD-10-CM | POA: Diagnosis present

## 2016-04-23 DIAGNOSIS — E559 Vitamin D deficiency, unspecified: Secondary | ICD-10-CM | POA: Diagnosis present

## 2016-04-23 HISTORY — PX: WHIPPLE PROCEDURE: SHX2667

## 2016-04-23 LAB — GLUCOSE, CAPILLARY
Glucose-Capillary: 196 mg/dL — ABNORMAL HIGH (ref 65–99)
Glucose-Capillary: 196 mg/dL — ABNORMAL HIGH (ref 65–99)
Glucose-Capillary: 234 mg/dL — ABNORMAL HIGH (ref 65–99)

## 2016-04-23 SURGERY — WHIPPLE PROCEDURE
Anesthesia: Epidural | Site: Abdomen

## 2016-04-23 MED ORDER — SUGAMMADEX SODIUM 200 MG/2ML IV SOLN
INTRAVENOUS | Status: AC
  Filled 2016-04-23: qty 2

## 2016-04-23 MED ORDER — ALBUMIN HUMAN 5 % IV SOLN
INTRAVENOUS | Status: DC | PRN
  Administered 2016-04-23 (×2): via INTRAVENOUS

## 2016-04-23 MED ORDER — ROCURONIUM BROMIDE 100 MG/10ML IV SOLN
INTRAVENOUS | Status: DC | PRN
  Administered 2016-04-23: 10 mg via INTRAVENOUS
  Administered 2016-04-23: 50 mg via INTRAVENOUS
  Administered 2016-04-23: 10 mg via INTRAVENOUS

## 2016-04-23 MED ORDER — ONDANSETRON HCL 4 MG/2ML IJ SOLN
4.0000 mg | Freq: Four times a day (QID) | INTRAMUSCULAR | Status: DC | PRN
  Administered 2016-04-24 – 2016-05-03 (×8): 4 mg via INTRAVENOUS
  Filled 2016-04-23 (×6): qty 2

## 2016-04-23 MED ORDER — ROCURONIUM BROMIDE 50 MG/5ML IV SOSY
PREFILLED_SYRINGE | INTRAVENOUS | Status: AC
  Filled 2016-04-23: qty 5

## 2016-04-23 MED ORDER — SODIUM CHLORIDE 0.9 % IV SOLN
INTRAVENOUS | Status: DC
  Administered 2016-04-23 (×2): via INTRAVENOUS

## 2016-04-23 MED ORDER — LACTATED RINGERS IV SOLN: INTRAVENOUS | Status: DC

## 2016-04-23 MED ORDER — DEXAMETHASONE SODIUM PHOSPHATE 10 MG/ML IJ SOLN
INTRAMUSCULAR | Status: DC | PRN
  Administered 2016-04-23: 10 mg via INTRAVENOUS

## 2016-04-23 MED ORDER — DIPHENHYDRAMINE HCL 12.5 MG/5ML PO ELIX: 12.5000 mg | ORAL_SOLUTION | Freq: Four times a day (QID) | ORAL | Status: DC | PRN

## 2016-04-23 MED ORDER — INSULIN ASPART 100 UNIT/ML ~~LOC~~ SOLN
0.0000 [IU] | SUBCUTANEOUS | Status: DC
  Administered 2016-04-23 – 2016-04-24 (×3): 2 [IU] via SUBCUTANEOUS
  Administered 2016-04-24 (×3): 1 [IU] via SUBCUTANEOUS
  Administered 2016-04-24: 2 [IU] via SUBCUTANEOUS
  Administered 2016-04-24: 3 [IU] via SUBCUTANEOUS
  Administered 2016-04-25: 2 [IU] via SUBCUTANEOUS
  Administered 2016-04-25 – 2016-04-27 (×14): 1 [IU] via SUBCUTANEOUS
  Administered 2016-04-27: 2 [IU] via SUBCUTANEOUS
  Administered 2016-04-27 – 2016-04-29 (×10): 1 [IU] via SUBCUTANEOUS
  Administered 2016-04-29: 2 [IU] via SUBCUTANEOUS
  Administered 2016-04-29: 4 [IU] via SUBCUTANEOUS
  Administered 2016-04-30 (×5): 1 [IU] via SUBCUTANEOUS
  Administered 2016-05-01: 2 [IU] via SUBCUTANEOUS

## 2016-04-23 MED ORDER — NALOXONE HCL 0.4 MG/ML IJ SOLN: 0.4000 mg | INTRAMUSCULAR | Status: DC | PRN

## 2016-04-23 MED ORDER — CLOBETASOL PROPIONATE 0.05 % EX GEL: 1.0000 "application " | Freq: Every day | CUTANEOUS | Status: DC | PRN

## 2016-04-23 MED ORDER — PROPOFOL 10 MG/ML IV BOLUS
INTRAVENOUS | Status: DC | PRN
  Administered 2016-04-23: 110 mg via INTRAVENOUS

## 2016-04-23 MED ORDER — FENTANYL CITRATE (PF) 100 MCG/2ML IJ SOLN
INTRAMUSCULAR | Status: AC
  Filled 2016-04-23: qty 2

## 2016-04-23 MED ORDER — DIPHENHYDRAMINE HCL 50 MG/ML IJ SOLN
12.5000 mg | Freq: Four times a day (QID) | INTRAMUSCULAR | Status: DC | PRN
  Administered 2016-04-25: 12.5 mg via INTRAVENOUS
  Filled 2016-04-23: qty 1

## 2016-04-23 MED ORDER — PHENYLEPHRINE HCL 10 MG/ML IJ SOLN
INTRAVENOUS | Status: DC | PRN
  Administered 2016-04-23: 20 ug/min via INTRAVENOUS

## 2016-04-23 MED ORDER — HYDRALAZINE HCL 20 MG/ML IJ SOLN
20.0000 mg | INTRAMUSCULAR | Status: AC | PRN
  Administered 2016-04-25: 20 mg via INTRAVENOUS
  Filled 2016-04-23: qty 1

## 2016-04-23 MED ORDER — ONDANSETRON HCL 4 MG/2ML IJ SOLN
INTRAMUSCULAR | Status: AC
  Filled 2016-04-23: qty 2

## 2016-04-23 MED ORDER — AZELASTINE HCL 0.1 % NA SOLN: 1.0000 | Freq: Every evening | NASAL | Status: DC | PRN

## 2016-04-23 MED ORDER — FENTANYL CITRATE (PF) 100 MCG/2ML IJ SOLN
INTRAMUSCULAR | Status: DC
Start: 2016-04-23 — End: 2016-04-23
  Filled 2016-04-23: qty 2

## 2016-04-23 MED ORDER — ONDANSETRON HCL 4 MG/2ML IJ SOLN
INTRAMUSCULAR | Status: DC | PRN
Start: 2016-04-23 — End: 2016-04-23
  Administered 2016-04-23 (×2): 4 mg via INTRAVENOUS

## 2016-04-23 MED ORDER — EVICEL 5 ML EX KIT
PACK | CUTANEOUS | Status: DC | PRN
  Administered 2016-04-23: 5 mL

## 2016-04-23 MED ORDER — LIDOCAINE 2% (20 MG/ML) 5 ML SYRINGE
INTRAMUSCULAR | Status: AC
  Filled 2016-04-23: qty 5

## 2016-04-23 MED ORDER — PROPOFOL 10 MG/ML IV BOLUS
INTRAVENOUS | Status: AC
  Filled 2016-04-23: qty 20

## 2016-04-23 MED ORDER — HYDROMORPHONE HCL 1 MG/ML IJ SOLN: 0.2500 mg | INTRAMUSCULAR | Status: DC | PRN

## 2016-04-23 MED ORDER — CHLORHEXIDINE GLUCONATE 0.12 % MT SOLN
15.0000 mL | Freq: Two times a day (BID) | OROMUCOSAL | Status: DC
  Administered 2016-04-23 – 2016-05-06 (×16): 15 mL via OROMUCOSAL
  Filled 2016-04-23 (×14): qty 15

## 2016-04-23 MED ORDER — ACETAMINOPHEN 10 MG/ML IV SOLN
1000.0000 mg | Freq: Four times a day (QID) | INTRAVENOUS | Status: AC
  Administered 2016-04-23 – 2016-04-24 (×4): 1000 mg via INTRAVENOUS
  Filled 2016-04-23 (×4): qty 100

## 2016-04-23 MED ORDER — EPHEDRINE SULFATE 50 MG/ML IJ SOLN
INTRAMUSCULAR | Status: DC | PRN
  Administered 2016-04-23: 10 mg via INTRAVENOUS

## 2016-04-23 MED ORDER — EVICEL 5 ML EX KIT
PACK | CUTANEOUS | Status: AC
  Filled 2016-04-23: qty 1

## 2016-04-23 MED ORDER — ONDANSETRON 4 MG PO TBDP
4.0000 mg | ORAL_TABLET | Freq: Four times a day (QID) | ORAL | Status: DC | PRN
  Administered 2016-05-04: 4 mg via ORAL
  Filled 2016-04-23 (×2): qty 1

## 2016-04-23 MED ORDER — SODIUM CHLORIDE 0.9 % IV SOLN
INTRAVENOUS | Status: DC | PRN
  Administered 2016-04-23 (×2): via INTRAVENOUS

## 2016-04-23 MED ORDER — SUGAMMADEX SODIUM 200 MG/2ML IV SOLN
INTRAVENOUS | Status: DC | PRN
  Administered 2016-04-23: 200 mg via INTRAVENOUS

## 2016-04-23 MED ORDER — ONDANSETRON HCL 4 MG/2ML IJ SOLN
4.0000 mg | Freq: Four times a day (QID) | INTRAMUSCULAR | Status: DC | PRN
  Administered 2016-04-27: 4 mg via INTRAVENOUS
  Filled 2016-04-23 (×3): qty 2

## 2016-04-23 MED ORDER — SODIUM CHLORIDE 0.9% FLUSH: 9.0000 mL | INTRAVENOUS | Status: DC | PRN

## 2016-04-23 MED ORDER — PROMETHAZINE HCL 25 MG/ML IJ SOLN: 6.2500 mg | INTRAMUSCULAR | Status: DC | PRN

## 2016-04-23 MED ORDER — HYDROMORPHONE 1 MG/ML IV SOLN
INTRAVENOUS | Status: DC
  Administered 2016-04-23: 1.2 mg via INTRAVENOUS
  Administered 2016-04-23: 16:00:00 via INTRAVENOUS
  Administered 2016-04-24: 0.2 mg via INTRAVENOUS
  Administered 2016-04-24: 2 mg via INTRAVENOUS
  Administered 2016-04-24: 0 mg via INTRAVENOUS
  Administered 2016-04-24: 1.2 mg via INTRAVENOUS
  Administered 2016-04-24 (×2): 1 mg via INTRAVENOUS
  Administered 2016-04-25: 0.4 mg via INTRAVENOUS
  Administered 2016-04-25: 0.6 mg via INTRAVENOUS
  Administered 2016-04-25: 1.2 mg via INTRAVENOUS
  Administered 2016-04-25: 0.4 mg via INTRAVENOUS
  Administered 2016-04-25: 0.8 mg via INTRAVENOUS
  Administered 2016-04-25: 1 mg via INTRAVENOUS
  Administered 2016-04-26: 0.6 mg via INTRAVENOUS
  Administered 2016-04-26: 0.4 mg via INTRAVENOUS
  Administered 2016-04-26 (×2): 0.6 mg via INTRAVENOUS
  Administered 2016-04-26: 0.4 mg via INTRAVENOUS
  Administered 2016-04-26: 0.8 mg via INTRAVENOUS
  Administered 2016-04-27: 0.6 mg via INTRAVENOUS
  Administered 2016-04-27: 0.8 mg via INTRAVENOUS
  Administered 2016-04-27 (×2): 0.2 mg via INTRAVENOUS
  Administered 2016-04-27: 0.6 mg via INTRAVENOUS
  Administered 2016-04-28: 0.4 mg via INTRAVENOUS
  Administered 2016-04-28: 0 mg via INTRAVENOUS
  Administered 2016-04-28: 1.4 mg via INTRAVENOUS
  Administered 2016-04-28: 18:00:00 via INTRAVENOUS
  Administered 2016-04-28: 0.6 mg via INTRAVENOUS
  Administered 2016-04-28: 5 mL via INTRAVENOUS
  Administered 2016-04-28: 0.6 mg via INTRAVENOUS
  Administered 2016-04-29: 6 mg via INTRAVENOUS
  Administered 2016-04-29 (×2): 1.2 mg via INTRAVENOUS
  Administered 2016-04-29: 0 mg via INTRAVENOUS
  Administered 2016-04-29: 1 mg via INTRAVENOUS
  Administered 2016-04-29: 6 mg via INTRAVENOUS
  Administered 2016-04-30: 1.8 mg via INTRAVENOUS
  Administered 2016-04-30: 0.8 mg via INTRAVENOUS
  Administered 2016-04-30: 0.6 mg via INTRAVENOUS
  Administered 2016-04-30: 1 mg via INTRAVENOUS
  Administered 2016-04-30: 0.6 mg via INTRAVENOUS
  Administered 2016-04-30: 1 mg via INTRAVENOUS
  Filled 2016-04-23: qty 25

## 2016-04-23 MED ORDER — CEFAZOLIN SODIUM-DEXTROSE 2-4 GM/100ML-% IV SOLN
2.0000 g | Freq: Three times a day (TID) | INTRAVENOUS | Status: AC
  Administered 2016-04-23: 2 g via INTRAVENOUS
  Filled 2016-04-23: qty 100

## 2016-04-23 MED ORDER — HYDROMORPHONE 1 MG/ML IV SOLN
INTRAVENOUS | Status: AC
  Filled 2016-04-23: qty 25

## 2016-04-23 MED ORDER — POLYVINYL ALCOHOL 1.4 % OP SOLN
1.0000 [drp] | Freq: Three times a day (TID) | OPHTHALMIC | Status: DC | PRN
  Administered 2016-04-24: 1 [drp] via OPHTHALMIC
  Filled 2016-04-23: qty 15

## 2016-04-23 MED ORDER — ORAL CARE MOUTH RINSE
15.0000 mL | Freq: Two times a day (BID) | OROMUCOSAL | Status: DC
  Administered 2016-04-24 – 2016-04-30 (×7): 15 mL via OROMUCOSAL

## 2016-04-23 MED ORDER — DIPHENHYDRAMINE HCL 50 MG/ML IJ SOLN: 12.5000 mg | Freq: Four times a day (QID) | INTRAMUSCULAR | Status: DC | PRN

## 2016-04-23 MED ORDER — STERILE WATER FOR IRRIGATION IR SOLN
Status: DC | PRN
  Administered 2016-04-23: 1000 mL

## 2016-04-23 MED ORDER — 0.9 % SODIUM CHLORIDE (POUR BTL) OPTIME
TOPICAL | Status: DC | PRN
  Administered 2016-04-23 (×3): 1000 mL

## 2016-04-23 MED ORDER — ROPIVACAINE HCL 2 MG/ML IJ SOLN
INTRAMUSCULAR | Status: DC | PRN
  Administered 2016-04-23: 5 mL/h via EPIDURAL

## 2016-04-23 MED ORDER — MIDAZOLAM HCL 2 MG/2ML IJ SOLN
INTRAMUSCULAR | Status: DC
Start: 2016-04-23 — End: 2016-04-23
  Filled 2016-04-23: qty 2

## 2016-04-23 MED ORDER — DICLOFENAC SODIUM 1 % TD GEL
2.0000 g | Freq: Four times a day (QID) | TRANSDERMAL | Status: DC | PRN
  Filled 2016-04-23: qty 100

## 2016-04-23 MED ORDER — MEPERIDINE HCL 25 MG/ML IJ SOLN: 6.2500 mg | INTRAMUSCULAR | Status: DC | PRN

## 2016-04-23 MED ORDER — ROPIVACAINE HCL 2 MG/ML IJ SOLN
5.0000 mL/h | INTRAMUSCULAR | Status: DC
  Administered 2016-04-23: 2.5 mL via EPIDURAL
  Administered 2016-04-23: 5 mL via EPIDURAL
  Administered 2016-04-23: 2.5 mL via EPIDURAL
  Filled 2016-04-23 (×2): qty 200

## 2016-04-23 MED ORDER — HYDROMORPHONE HCL 1 MG/ML IJ SOLN
0.5000 mg | INTRAMUSCULAR | Status: DC | PRN
  Administered 2016-04-24: 1 mg via INTRAVENOUS
  Filled 2016-04-23: qty 1

## 2016-04-23 MED ORDER — ROPIVACAINE HCL 2 MG/ML IJ SOLN
5.0000 mL/h | INTRAMUSCULAR | Status: DC
  Administered 2016-04-24: 7 mL/h via EPIDURAL
  Filled 2016-04-23 (×4): qty 200

## 2016-04-23 MED ORDER — KCL IN DEXTROSE-NACL 20-5-0.45 MEQ/L-%-% IV SOLN
INTRAVENOUS | Status: DC
  Administered 2016-04-23 – 2016-04-24 (×2): via INTRAVENOUS
  Filled 2016-04-23 (×2): qty 1000

## 2016-04-23 MED ORDER — CEFAZOLIN SODIUM-DEXTROSE 2-4 GM/100ML-% IV SOLN
2.0000 g | INTRAVENOUS | Status: AC
  Administered 2016-04-23: 2 g via INTRAVENOUS
  Filled 2016-04-23: qty 100

## 2016-04-23 MED ORDER — PANTOPRAZOLE SODIUM 40 MG IV SOLR
40.0000 mg | Freq: Every day | INTRAVENOUS | Status: DC
  Administered 2016-04-23 – 2016-04-27 (×5): 40 mg via INTRAVENOUS
  Filled 2016-04-23 (×5): qty 40

## 2016-04-23 MED ORDER — FENTANYL CITRATE (PF) 100 MCG/2ML IJ SOLN
INTRAMUSCULAR | Status: DC | PRN
  Administered 2016-04-23 (×5): 25 ug via INTRAVENOUS
  Administered 2016-04-23: 75 ug via INTRAVENOUS
  Administered 2016-04-23: 100 ug via INTRAVENOUS

## 2016-04-23 MED ORDER — LIDOCAINE HCL (CARDIAC) 20 MG/ML IV SOLN
INTRAVENOUS | Status: DC | PRN
  Administered 2016-04-23: 80 mg via INTRAVENOUS

## 2016-04-23 SURGICAL SUPPLY — 92 items
BAG BILE T-TUBES STRL (MISCELLANEOUS) ×4 IMPLANT
BOOT SUTURE AID YELLOW STND (SUTURE) ×4 IMPLANT
CANISTER SUCT 3000ML PPV (MISCELLANEOUS) ×2 IMPLANT
CATH KIT ON Q 7.5IN SLV (PAIN MANAGEMENT) IMPLANT
CATH ROBINSON RED A/P 16FR (CATHETERS) IMPLANT
CHLORAPREP W/TINT 26ML (MISCELLANEOUS) ×2 IMPLANT
CLIP LIGATING HEM O LOK PURPLE (MISCELLANEOUS) ×8 IMPLANT
CLIP LIGATING HEMO O LOK GREEN (MISCELLANEOUS) ×4 IMPLANT
CLIP LIGATING HEMOLOK MED (MISCELLANEOUS) ×2 IMPLANT
CLIP TI LARGE 6 (CLIP) ×2 IMPLANT
CLIP TI MEDIUM 24 (CLIP) ×2 IMPLANT
CONT SPEC 4OZ CLIKSEAL STRL BL (MISCELLANEOUS) ×4 IMPLANT
COVER MAYO STAND STRL (DRAPES) ×2 IMPLANT
COVER SURGICAL LIGHT HANDLE (MISCELLANEOUS) ×2 IMPLANT
DRAIN CHANNEL 19F RND (DRAIN) ×4 IMPLANT
DRAIN PENROSE 1/2X36 STERILE (WOUND CARE) IMPLANT
DRAPE LAPAROSCOPIC ABDOMINAL (DRAPES) ×2 IMPLANT
DRAPE WARM FLUID 44X44 (DRAPE) ×2 IMPLANT
DRSG COVADERM 4X10 (GAUZE/BANDAGES/DRESSINGS) IMPLANT
DRSG COVADERM 4X14 (GAUZE/BANDAGES/DRESSINGS) ×2 IMPLANT
DRSG COVADERM 4X6 (GAUZE/BANDAGES/DRESSINGS) IMPLANT
DRSG COVADERM 4X8 (GAUZE/BANDAGES/DRESSINGS) IMPLANT
DRSG TELFA 3X8 NADH (GAUZE/BANDAGES/DRESSINGS) IMPLANT
ELECT BLADE 4.0 EZ CLEAN MEGAD (MISCELLANEOUS) ×4
ELECT BLADE 6.5 EXT (BLADE) ×2 IMPLANT
ELECT CAUTERY BLADE 6.4 (BLADE) ×2 IMPLANT
ELECT REM PT RETURN 9FT ADLT (ELECTROSURGICAL) ×2
ELECTRODE BLDE 4.0 EZ CLN MEGD (MISCELLANEOUS) ×2 IMPLANT
ELECTRODE REM PT RTRN 9FT ADLT (ELECTROSURGICAL) ×1 IMPLANT
GAUZE SPONGE 4X4 12PLY STRL (GAUZE/BANDAGES/DRESSINGS) ×2 IMPLANT
GAUZE SPONGE 4X4 16PLY XRAY LF (GAUZE/BANDAGES/DRESSINGS) IMPLANT
GEL ULTRASOUND 20GR AQUASONIC (MISCELLANEOUS) ×2 IMPLANT
GLOVE BIO SURGEON STRL SZ 6 (GLOVE) ×4 IMPLANT
GLOVE BIO SURGEON STRL SZ7 (GLOVE) ×6 IMPLANT
GLOVE BIOGEL PI IND STRL 7.0 (GLOVE) ×3 IMPLANT
GLOVE BIOGEL PI INDICATOR 7.0 (GLOVE) ×3
GLOVE INDICATOR 6.5 STRL GRN (GLOVE) ×4 IMPLANT
GLOVE SURG SS PI 6.5 STRL IVOR (GLOVE) ×2 IMPLANT
GOWN STRL REUS W/ TWL LRG LVL3 (GOWN DISPOSABLE) ×5 IMPLANT
GOWN STRL REUS W/TWL 2XL LVL3 (GOWN DISPOSABLE) ×6 IMPLANT
GOWN STRL REUS W/TWL LRG LVL3 (GOWN DISPOSABLE) ×5
HEMOSTAT SURGICEL 2X14 (HEMOSTASIS) IMPLANT
KIT BASIN OR (CUSTOM PROCEDURE TRAY) ×2 IMPLANT
KIT MARKER MARGIN INK (KITS) ×2 IMPLANT
KIT ROOM TURNOVER OR (KITS) ×2 IMPLANT
KIT TOURNIQUET VASCULAR (KITS) IMPLANT
LOOP VESSEL MAXI BLUE (MISCELLANEOUS) ×2 IMPLANT
NEEDLE BIOPSY 14X6 SOFT TISS (NEEDLE) IMPLANT
NS IRRIG 1000ML POUR BTL (IV SOLUTION) ×4 IMPLANT
PACK GENERAL/GYN (CUSTOM PROCEDURE TRAY) ×2 IMPLANT
PAD ARMBOARD 7.5X6 YLW CONV (MISCELLANEOUS) ×4 IMPLANT
PAD SHARPS MAGNETIC DISPOSAL (MISCELLANEOUS) IMPLANT
PLUG CATH AND CAP STER (CATHETERS) IMPLANT
RELOAD PROXIMATE 75MM BLUE (ENDOMECHANICALS) ×6 IMPLANT
RELOAD PROXIMATE 75MM GREEN (ENDOMECHANICALS) IMPLANT
RELOAD STAPLER LINE PROX 30 GR (STAPLE) ×1 IMPLANT
SEPRAFILM PROCEDURAL PACK 3X5 (MISCELLANEOUS) IMPLANT
SHEARS FOC LG CVD HARMONIC 17C (MISCELLANEOUS) ×2 IMPLANT
SPONGE INTESTINAL PEANUT (DISPOSABLE) IMPLANT
SPONGE LAP 18X18 X RAY DECT (DISPOSABLE) ×4 IMPLANT
SPONGE SURGIFOAM ABS GEL 100 (HEMOSTASIS) IMPLANT
STAPLER PROXIMATE 75MM BLUE (STAPLE) ×2 IMPLANT
STAPLER RELOAD LINE PROX 30 GR (STAPLE) ×2
STAPLER VISISTAT 35W (STAPLE) ×2 IMPLANT
SUCTION POOLE TIP (SUCTIONS) ×2 IMPLANT
SUT 5.0 PDS RB-1 (SUTURE) ×10
SUT ETHILON 2 0 FS 18 (SUTURE) ×4 IMPLANT
SUT ETHILON 2 LR (SUTURE) IMPLANT
SUT PDS AB 1 TP1 96 (SUTURE) ×4 IMPLANT
SUT PDS AB 3-0 SH 27 (SUTURE) ×8 IMPLANT
SUT PDS AB 4-0 RB1 27 (SUTURE) ×24 IMPLANT
SUT PDS PLUS AB 5-0 RB-1 (SUTURE) ×10 IMPLANT
SUT PROLENE 3 0 SH 48 (SUTURE) ×2 IMPLANT
SUT PROLENE 4 0 RB 1 (SUTURE) ×5
SUT PROLENE 4-0 RB1 .5 CRCL 36 (SUTURE) ×5 IMPLANT
SUT PROLENE 5 0 RB 1 DA (SUTURE) IMPLANT
SUT SILK 2 0 SH CR/8 (SUTURE) ×6 IMPLANT
SUT SILK 2 0 TIES 10X30 (SUTURE) ×2 IMPLANT
SUT SILK 3 0 SH CR/8 (SUTURE) ×2 IMPLANT
SUT SILK 3 0 TIES 10X30 (SUTURE) ×2 IMPLANT
SUT VIC AB 2-0 CT1 27 (SUTURE)
SUT VIC AB 2-0 CT1 TAPERPNT 27 (SUTURE) IMPLANT
SUT VIC AB 2-0 SH 18 (SUTURE) IMPLANT
SUT VIC AB 3-0 SH 18 (SUTURE) IMPLANT
SUT VIC AB 3-0 SH 27 (SUTURE)
SUT VIC AB 3-0 SH 27X BRD (SUTURE) IMPLANT
SUT VICRYL AB 2 0 TIES (SUTURE) IMPLANT
TAPE CLOTH SURG 4X10 WHT LF (GAUZE/BANDAGES/DRESSINGS) ×2 IMPLANT
TOWEL OR 17X26 10 PK STRL BLUE (TOWEL DISPOSABLE) ×2 IMPLANT
TRAY FOLEY CATH 14FRSI W/METER (CATHETERS) ×2 IMPLANT
TUBE CONNECTING 12X1/4 (SUCTIONS) ×2 IMPLANT
TUBE FEEDING 8FR 16IN STR KANG (MISCELLANEOUS) ×2 IMPLANT

## 2016-04-23 NOTE — Anesthesia Preprocedure Evaluation (Signed)
Anesthesia Evaluation  Patient identified by MRN, date of birth, ID band Patient awake    Reviewed: Allergy & Precautions, NPO status , Patient's Chart, lab work & pertinent test results  Airway Mallampati: II  TM Distance: >3 FB Neck ROM: Full    Dental no notable dental hx. (+) Teeth Intact   Pulmonary neg pulmonary ROS, sleep apnea ,    Pulmonary exam normal breath sounds clear to auscultation       Cardiovascular hypertension, Pt. on medications negative cardio ROS Normal cardiovascular exam Rhythm:Regular Rate:Normal     Neuro/Psych  Headaches, TIAnegative neurological ROS  negative psych ROS   GI/Hepatic negative GI ROS, Neg liver ROS, GERD  ,  Endo/Other  negative endocrine ROS  Renal/GU Renal InsufficiencyRenal diseasenegative Renal ROS  negative genitourinary   Musculoskeletal negative musculoskeletal ROS (+)   Abdominal   Peds negative pediatric ROS (+)  Hematology negative hematology ROS (+) anemia ,   Anesthesia Other Findings   Reproductive/Obstetrics negative OB ROS                             Anesthesia Physical Anesthesia Plan  ASA: III  Anesthesia Plan: General and Epidural   Post-op Pain Management: GA combined w/ Regional for post-op pain   Induction: Intravenous  Airway Management Planned: Oral ETT  Additional Equipment: Arterial line  Intra-op Plan:   Post-operative Plan: Extubation in OR  Informed Consent: I have reviewed the patients History and Physical, chart, labs and discussed the procedure including the risks, benefits and alternatives for the proposed anesthesia with the patient or authorized representative who has indicated his/her understanding and acceptance.   Dental advisory given  Plan Discussed with: CRNA and Surgeon  Anesthesia Plan Comments:         Anesthesia Quick Evaluation

## 2016-04-23 NOTE — Anesthesia Procedure Notes (Addendum)
Epidural Patient location during procedure: pre-op Start time: 04/23/2016 9:54 AM End time: 04/23/2016 10:02 AM  Staffing Anesthesiologist: Candida Peeling RAY Performed: anesthesiologist   Preanesthetic Checklist Completed: patient identified, site marked, surgical consent, pre-op evaluation, timeout performed, IV checked, risks and benefits discussed and monitors and equipment checked  Epidural Patient position: sitting Prep: DuraPrep Patient monitoring: heart rate, continuous pulse ox and blood pressure Approach: right paramedian Location: thoracic (1-12) Injection technique: LOR saline  Needle:  Needle type: Tuohy  Needle gauge: 17 G Needle length: 9 cm Needle insertion depth: 6 cm Catheter type: closed end flexible Catheter size: 20 Guage Catheter at skin depth: 9 cm Test dose: negative  Additional Notes Thoracic epidural placed at surgeon's request for post op pain control.  73ml 0.25% Bupivacaine placed through epidural in preop area.Reason for block:at surgeon's request and post-op pain management

## 2016-04-23 NOTE — Anesthesia Procedure Notes (Cosign Needed)
Procedure Name: Intubation Date/Time: 04/23/2016 10:23 AM Performed by: Candida Peeling RAY Pre-anesthesia Checklist: Patient identified Patient Re-evaluated:Patient Re-evaluated prior to inductionOxygen Delivery Method: Circle system utilized Preoxygenation: Pre-oxygenation with 100% oxygen Intubation Type: IV induction Ventilation: Mask ventilation without difficulty Laryngoscope Size: Miller and 2 Grade View: Grade I Tube type: Oral Tube size: 7.0 mm Number of attempts: 2 (First attempt, grade 1 View but ETT not ETCO2 noted. Tube Removed, patient mask ventilated. Second attempt grade 1 view with positive ETCO2.) Airway Equipment and Method: Patient positioned with wedge pillow and Stylet Placement Confirmation: ETT inserted through vocal cords under direct vision Secured at: 22 cm Tube secured with: Tape Dental Injury: Teeth and Oropharynx as per pre-operative assessment

## 2016-04-23 NOTE — Progress Notes (Signed)
eLink Physician-Brief Progress Note Patient Name: Kim Burns DOB: 1935-05-11 MRN: [id number]   Date of Service  04/23/2016  HPI/Events of Note  S/p whipple, RR wnl, on min O2, no labs, baseline crt  Noted, consider am crt bmet Mild HTn, treat pain and then re assess needs for anti HTN meds  eICU Interventions       Intervention Category Evaluation Type: New Patient Evaluation  Raylene Miyamoto. 04/23/2016, 5:34 PM

## 2016-04-23 NOTE — H&P (Signed)
Kim Burns Location: [facility] Patient #: [id number] DOB: 1935/04/07 Widowed / Language: Kim Burns / Race: White Female   History of Present Illness The patient is a 81 year old female who presents for a follow-up for Pancreatic mass. Patient is a 81 year old female referred by Dr. Arty Baumgartner for a pancreatic head mass. The patient was found to have a mass in 2015 when she underwent a CT scan for hematuria and kidney stone. She had a MRI 6 months after that that showed minimal change in the mass. She recently started having some changes in her blood sugars and the mass was thought about again. She underwent a repeated MRI that did show enlargement of the mass and progressive dilation of the main pancreatic duct. The patient denied abdominal pain. She had a 20 pound weight loss, but said that this has been on purpose. She has lost an additional 2-3 pounds over the holidays. The patient lives alone but does most of her housework. Of note, the patient's nephew had pancreatic cancer and died of disease. The patient denies any diarrhea or change in her bowel habits. She is on Plavix for history of a TIA in the mid 90s. She had an echo 2 years ago and response to fatigue that showed some diastolic dysfunction. She does have significant hypertension.   She was referred for EUS which she had since last visit. This was done by Dr. Paulita Fujita 12.20/2017. He did not see communication of the mass with the pancreatic duct. He also saw dilation of main pancreatic duct and a pancreatic duct stone. Cyst sampling was not done due to no good window for aspiration. He saw thin septations, instead of the thicker septations seen on MRI.     MRI abdomen 11/20/15 Pancreas: 3.9 x 4.7 x 3.6 cm multiloculated cystic lesion in the pancreatic head/ uncinate process with multiple complex/thickened septations, previously 4.2 x 3.6 x 3.6 cm. Associated main pancreatic ductal dilatation, measuring up  to 14 mm, previously 6 mm. Associated pancreatic atrophy in the body/ tail. Enhancement cannot be assessed in the absence of contrast administration.  Spleen: Small splenic cysts and/or hemangiomas.  Adrenals/Urinary Tract: Adrenal glands are within normal limits.  Numerous bilateral renal cysts, including a 2.3 cm left anterior interpolar simple cyst (series 4/image 20) and a 1.4 cm lateral left lower pole hemorrhagic cyst (series 7/ image 65). Enhancement cannot be assessed in the absence of intravenous contrast administration.  No hydronephrosis.  Stomach/Bowel: Stomach is within normal limits.  Visualized bowel is unremarkable.  Vascular/Lymphatic: No evidence of abdominal aortic aneurysm.  No suspicious abdominal lymphadenopathy.  Other: No abdominal ascites.  Musculoskeletal: No focal osseous lesions.  IMPRESSION: 4.7 cm multiloculated cystic lesion in the pancreatic head/ uncinate process, increased. Progressive pancreatic ductal dilatation, now measuring up to 14 mm.  Differential considerations include mucinous cystic neoplasm or mixed type IPMN with main duct involvement, both of which harbor malignant potential.  EUS with sampling is suggested. Given interval growth and progressive ductal dilatation, continued follow-up is of uncertain utility in the absence of planned intervention.    Allergies Codeine Phosphate *ANALGESICS - OPIOID*  Statins   Medication History Astelin (137MCG/SPRAY Solution, Nasal as needed) Active. Calcitriol (0.25MCG Capsule, Oral) Active. Sensipar (30MG  Tablet, Oral) Active. Plavix (75MG  Tablet, Oral) Active. Voltaren (1% Gel, Transdermal) Active. Uloric (80MG  Tablet, Oral) Active. Tricor (145MG  Tablet, Oral) Active. Fish Oil (1000MG  Capsule, Oral) Active. Micardis (80MG  Tablet, Oral) Active. Vitamin D (Cholecalciferol) (1000UNIT Capsule, Oral) Active. Medications Reconciled  Review of Systems  All  other systems negative  Vitals  Weight: 149 lb Height: 62in Body Surface Area: 1.69 m Body Mass Index: 27.25 kg/m  Temp.: 98.23F  Pulse: 72 (Regular)  BP: 142/88 (Sitting, Left Arm, Standard)       Physical Exam  General Mental Status-Alert. General Appearance-Consistent with stated age. Hydration-Well hydrated. Voice-Normal.  Head and Neck Head-normocephalic, atraumatic with no lesions or palpable masses.  Eye Sclera/Conjunctiva - Bilateral-No scleral icterus.  Chest and Lung Exam Chest and lung exam reveals -quiet, even and easy respiratory effort with no use of accessory muscles. Inspection Chest Wall - Normal. Back - normal.  Breast - Did not examine.  Cardiovascular Cardiovascular examination reveals -normal pedal pulses bilaterally. Note: regular rate and rhythm  Abdomen Inspection-Inspection Normal. Palpation/Percussion Palpation and Percussion of the abdomen reveal - Soft, Non Tender, No Rebound tenderness, No Rigidity (guarding) and No hepatosplenomegaly.  Peripheral Vascular Upper Extremity Inspection - Bilateral - Normal - No Clubbing, No Cyanosis, No Edema, Pulses Intact. Lower Extremity Palpation - Edema - Bilateral - No edema.  Neurologic Neurologic evaluation reveals -alert and oriented x 3 with no impairment of recent or remote memory. Mental Status-Normal.  Musculoskeletal Global Assessment -Note: no gross deformities.  Normal Exam - Left-Upper Extremity Strength Normal and Lower Extremity Strength Normal. Normal Exam - Right-Upper Extremity Strength Normal and Lower Extremity Strength Normal.  Lymphatic Head & Neck  General Head & Neck Lymphatics: Bilateral - Description - Normal. Axillary  General Axillary Region: Bilateral - Description - Normal. Tenderness - Non Tender.    Assessment & Plan CYSTIC MASS OF PANCREAS (K86.2) Impression: Despite no cyst fluid analysis, it is reasonable to  resect this mass. It is larger than previously noted, she has developed diabetes, and new main pancreatic duct dilation. She has a relative who has died of pancreatic cancer.  She is in good health and would like to proceed with Whipple. We reviewed surgery, risks, restrictions, and benefits. She lives alone and would need to go to SNF briefly before going home.  I discussed the surgery with the patient including diagrams of anatomy. I discussed the potential for diagnostic laparoscopy. In the case of pancreatic cancer, if spread of the disease is found, we will abort the procedure and not proceed with resection. The rationale for this was discussed with the patient. There has not been data to support resection of Stage IV disease in terms of survival benefit.  We discussed possible complications including: Potential of aborting procedure if tumor is invading the superior mesenteric or hepatic arteries Bleeding Infection and possible wound complications such as hernia Damage to adjacent structures Leak of anastamoses, primarily pancreatic Possible need for other procedures Possible prolonged nausea with possible need for external feeding. Possible prolonged hospital stay. Possible development of diabetes or worsening of current diabetes. Possible pancreatic exocrine insufficiency Prolonged fatigue/weakness/appetite Possible early recurrence of cancer   The patient understands and wishes to proceed. The patient has been advised to turn in disability paperwork to our office. Current Plans Pt Education - [facility] whipple pt info You are being scheduled for surgery- Our schedulers will call you.  You should hear from our office's scheduling department within 5 working days about the location, date, and time of surgery. We try to make accommodations for patient's preferences in scheduling surgery, but sometimes the OR schedule or the surgeon's schedule prevents [place] from making those  accommodations.  If you have not heard from our office [phone]) in 5 working days,  call the office and ask for your surgeon's nurse.  If you have other questions about your diagnosis, plan, or surgery, call the office and ask for your surgeon's nurse.    Signed by Stark Klein, MD

## 2016-04-23 NOTE — Anesthesia Postprocedure Evaluation (Addendum)
Anesthesia Post Note  Patient: Kim Burns  Procedure(s) Performed: Procedure(s) (LRB): WHIPPLE PROCEDURE (N/A)  Patient location during evaluation: PACU Anesthesia Type: Epidural Level of consciousness: sedated Pain management: pain level controlled Vital Signs Assessment: post-procedure vital signs reviewed and stable Respiratory status: spontaneous breathing and respiratory function stable Cardiovascular status: stable Anesthetic complications: no       Last Vitals:  Vitals:   04/23/16 1530 04/23/16 1531  BP:    Pulse: 73   Resp: (!) 21 18  Temp:      Last Pain:  Vitals:   04/23/16 1550  TempSrc:   PainSc: Asleep                 Arshad Oberholzer DANIEL

## 2016-04-23 NOTE — Progress Notes (Signed)
Report given to steve shaw rn as caregiver 

## 2016-04-23 NOTE — Transfer of Care (Cosign Needed)
Immediate Anesthesia Transfer of Care Note  Patient: Kim Burns  Procedure(s) Performed: Procedure(s): WHIPPLE PROCEDURE (N/A)  Patient Location: PACU  Anesthesia Type:General and Regional  Level of Consciousness: awake, alert  and oriented  Airway & Oxygen Therapy: Patient Spontanous Breathing and Patient connected to nasal cannula oxygen  Post-op Assessment: Report given to RN and Post -op Vital signs reviewed and stable  Post vital signs: Reviewed and stable  Last Vitals:  Vitals:   04/23/16 0808  BP: (!) 166/88  Pulse: 62  Resp: 18  Temp: 36.9 C    Last Pain:  Vitals:   04/23/16 0808  TempSrc: Oral         Complications: No apparent anesthesia complications

## 2016-04-23 NOTE — Op Note (Signed)
PREOPERATIVE DIAGNOSIS: IPMN pancreatic head  POSTOPERATIVE DIAGNOSIS: Same.   PROCEDURES PERFORMED:   Classic pancreaticoduodenectomy   Placement of pancreatic stent  Repair of umbilical hernia as part of closure  SURGEON: Stark Klein, MD   ASSISTANT: Jackolyn Confer, MD   ANESTHESIA: General and epidural   FINDINGS: 4.7 cm cystic pancreatic head mass. soft pancreatic tissue. 8 mm common bile duct. 6 mm pancreatic duct  SPECIMENS:  1. Pancreaticoduodenectomy  2. portal nodes   ESTIMATED BLOOD LOSS: 500 mL.   COMPLICATIONS: None known.   PROCEDURE:   Pt was identified in the holding area, taken to  the operating room, and placed supine on the operating room  table. General anesthesia was induced. The patient's abdomen was  prepped and draped in a sterile fashion, after a Foley catheter was  placed. A time-out was performed according to the surgical safety check  list. When all was correct we continued.   .    A midline incision was made from the xiphoid to just below the umbilicus.  The peritoneum was entered in the center of the abdomen. Digital retraction was then used to elevate the preperitoneal fat, and this was taken with the cautery as well. The subcutaneous tissues  were taken with the cautery. Care was taken to protect the underlying viscera. The adhesions from the cholecystectomy were taken down from the posterior abdominal wall.    The Bookwalter self-retaining retractor was placed  for visualization. The adhesions to the liver were taken down with cautery. The  duodenum was kocherized extensively with blunt dissection and with cautery. Several portal lymph nodes were taken with the hamonic.    The common bile duct was skeletonized near the duodenum. A vessel loop was passed around it. The gastroduodenal artery, as well as the common hepatic artery were skeletonized. The proper hepatic artery was traced out to make sure that flow was going to both sides of the  liver when the GDA was clamped. The GDA was test clamped with the bulldog, with good flow to the liver and no signs of ischemia. This was divided with 2-0 silk ties and then clipped. The proper hepatic artery was reflected upward, and the anterior portal vein was exposed. A small vein on the anterior portal vein was oversewn for bleeding.  A Kelly clamp was passed underneath the pancreas at the superior mesenteric vein, and this passed easily with no signs of tumor involvement.   Attention was then directed to the stomach, and the omentum was taken  off of the stomach at the border of the antrum and the body. The  gastrohepatic ligament was taken down with the harmonic, and care was  taken to make sure there was not a replaced left hepatic artery in this  location. The stomach was divided with the GIA-75 stapler. The border  of the stomach was oversewn with a 3-0 running PDS suture.   Attention was then directed to the small bowel. Around 10 cm past the  ligament of Treitz was located, and this was divided with the 75-GIA.  The distal portion of the jejunum was also oversewn with a 3-0 PDS  suture. The fourth portion of the duodenum was skeletonized with the  harmonic scalpel, taking down all of the mesenteric vessels. The  ligament of Treitz was taken down. The IMV was preserved.  The duodenum was then passed underneath the SMV.   2-0 silk sutures were tied down and the inferior and superior border of the pancreas. The  TA 30 was used to divide the pancreas.  There was significant mucin present in the pancreas.  The Bovie was used to coagulate the small bleeders at the border of the pancreas.  The Overholt in combination with the harmonic and locking Weck clips  were then used to take the uncinate process off of the portal vein and  the superior mesenteric artery. Care was taken not to incorporate the  superior mesenteric artery in the dissection. The specimen was then marked and passed off the  table for frozen section margin.   The jejunum was then passed underneath  the SMV in order to get appropriate lie for the pancreatic and biliary  anastomoses. The more distal portion of the jejunum was pulled up over  the colon, and two 3-0 silks were placed through the posterior border  of the stomach for the gastrojejunostomy. The stomach and the small  bowel were opened, and a GIA-75 was used to create an end-to-end  anastomosis. The open areas of the staple line were examined to ensure  that there was hemostasis. The defect was then closed with a single  layer of running Connell suture of 3-0 PDS. Prior to a complete  closure, the NG tube was passed toward the afferent limb.   The appropriate location for the choledochojejunostomy was identified, and  the small bowel was opened approximately 10 mm. The anastamosis was created with approximately 9-11 4-0 interrupted PDS sutures.   The 2 corner sutures were placed first  and then the posterior layer was done in an interrupted fashion tying on  the inside. The superior layer was then closed with interrupted sutures as  well.   At this point the frozens returned back as all negative for malignancy.  The pancreatic duct margin had some mucinous cellularity, but no dysplasia. The pancreatic  anastomosis was then created by opening the jejunum the length  of the pancreatic parenchyma. The pancreas was soft, and the duct was 6 mm. A pediatric feeding tube was used as a pancreatic stent. The posterior layer was formed first with 2-0 silk sutures in interrupted fashion. A duct to mucosa anastamosis was formed with interrupted 5-0 PDS sutures. The anterior layer was then oversewn with 2-0 silks to dunk the pancreatic parenchyma.   The areas were then irrigated and then those anastomoses were covered  with Evicel. This was allowed to dry. The abdomen was then irrigated  again and all the laparotomy sponges were removed. A lap count was  performed,  which was correct. Two 19-Blake drains were placed, with the  lateral-most drain placed behind the choledochojejunostomy. The medial  Blake drain was placed just anterior and slightly superior to the  Pancreaticojejunostomy.  A tongue of omentum was placed around the pancreatic anastamosis.  The fascia was then closed with #1 looped running PDS sutures. The skin was irrigated and then closed with  staples. The wounds were cleaned, dried and dressed with a sterile  dressing.   The patient tolerated the procedure well and was extubated and taken to  PACU in stable condition. Needle and sponge counts were correct x2.

## 2016-04-24 ENCOUNTER — Encounter (HOSPITAL_COMMUNITY): Payer: Self-pay | Admitting: General Surgery

## 2016-04-24 ENCOUNTER — Encounter (HOSPITAL_COMMUNITY): Payer: Self-pay | Admitting: Anesthesiology

## 2016-04-24 LAB — CBC
HCT: 27.1 % — ABNORMAL LOW (ref 36.0–46.0)
HEMOGLOBIN: 8.4 g/dL — AB (ref 12.0–15.0)
MCH: 28.7 pg (ref 26.0–34.0)
MCHC: 31 g/dL (ref 30.0–36.0)
MCV: 92.5 fL (ref 78.0–100.0)
Platelets: 274 10*3/uL (ref 150–400)
RBC: 2.93 MIL/uL — ABNORMAL LOW (ref 3.87–5.11)
RDW: 15.3 % (ref 11.5–15.5)
WBC: 13.6 10*3/uL — AB (ref 4.0–10.5)

## 2016-04-24 LAB — PROTIME-INR
INR: 1.15
Prothrombin Time: 14.7 seconds (ref 11.4–15.2)

## 2016-04-24 LAB — POCT I-STAT 7, (LYTES, BLD GAS, ICA,H+H)
Acid-base deficit: 6 mmol/L — ABNORMAL HIGH (ref 0.0–2.0)
BICARBONATE: 19.6 mmol/L — AB (ref 20.0–28.0)
Calcium, Ion: 1.07 mmol/L — ABNORMAL LOW (ref 1.15–1.40)
HEMATOCRIT: 24 % — AB (ref 36.0–46.0)
Hemoglobin: 8.2 g/dL — ABNORMAL LOW (ref 12.0–15.0)
O2 Saturation: 100 %
PCO2 ART: 38.5 mmHg (ref 32.0–48.0)
PO2 ART: 203 mmHg — AB (ref 83.0–108.0)
Patient temperature: 36.1
Potassium: 4.2 mmol/L (ref 3.5–5.1)
SODIUM: 144 mmol/L (ref 135–145)
TCO2: 21 mmol/L (ref 0–100)
pH, Arterial: 7.309 — ABNORMAL LOW (ref 7.350–7.450)

## 2016-04-24 LAB — GLUCOSE, CAPILLARY
GLUCOSE-CAPILLARY: 172 mg/dL — AB (ref 65–99)
GLUCOSE-CAPILLARY: 144 mg/dL — AB (ref 65–99)
GLUCOSE-CAPILLARY: 162 mg/dL — AB (ref 65–99)
GLUCOSE-CAPILLARY: 136 mg/dL — AB (ref 65–99)
Glucose-Capillary: 127 mg/dL — ABNORMAL HIGH (ref 65–99)
Glucose-Capillary: 144 mg/dL — ABNORMAL HIGH (ref 65–99)

## 2016-04-24 LAB — MAGNESIUM: MAGNESIUM: 1.6 mg/dL — AB (ref 1.7–2.4)

## 2016-04-24 LAB — COMPREHENSIVE METABOLIC PANEL
ALT: 94 U/L — ABNORMAL HIGH (ref 14–54)
AST: 211 U/L — ABNORMAL HIGH (ref 15–41)
Albumin: 3.4 g/dL — ABNORMAL LOW (ref 3.5–5.0)
Alkaline Phosphatase: 15 U/L — ABNORMAL LOW (ref 38–126)
Anion gap: 11 (ref 5–15)
BUN: 50 mg/dL — ABNORMAL HIGH (ref 6–20)
CHLORIDE: 111 mmol/L (ref 101–111)
CO2: 18 mmol/L — ABNORMAL LOW (ref 22–32)
CREATININE: 2.75 mg/dL — AB (ref 0.44–1.00)
Calcium: 7.6 mg/dL — ABNORMAL LOW (ref 8.9–10.3)
GFR, EST AFRICAN AMERICAN: 18 mL/min — AB (ref >60)
GFR, EST NON AFRICAN AMERICAN: 15 mL/min — AB (ref >60)
Glucose, Bld: 185 mg/dL — ABNORMAL HIGH (ref 65–99)
POTASSIUM: 5.1 mmol/L (ref 3.5–5.1)
Sodium: 140 mmol/L (ref 135–145)
Total Bilirubin: 0.5 mg/dL (ref 0.3–1.2)
Total Protein: 5.6 g/dL — ABNORMAL LOW (ref 6.5–8.1)

## 2016-04-24 LAB — MRSA PCR SCREENING: MRSA by PCR: NEGATIVE

## 2016-04-24 LAB — PHOSPHORUS: PHOSPHORUS: 5.7 mg/dL — AB (ref 2.5–4.6)

## 2016-04-24 MED ORDER — DEXTROSE-NACL 5-0.45 % IV SOLN
INTRAVENOUS | Status: DC
  Administered 2016-04-24: 100 mL/h via INTRAVENOUS
  Administered 2016-04-24 – 2016-04-25 (×2): via INTRAVENOUS
  Administered 2016-04-25: 100 mL/h via INTRAVENOUS
  Administered 2016-04-26 – 2016-05-01 (×11): via INTRAVENOUS

## 2016-04-24 NOTE — Progress Notes (Signed)
1 Day Post-Op  Subjective: Upper abdomen sore.  Tolerable.  1x episode nausea.  No emesis.    Objective: Vital signs in last 24 hours: Temp:  [97.5 F (36.4 C)-98.6 F (37 C)] 98.3 F (36.8 C) (03/07 1100) Pulse Rate:  [73-94] 84 (03/07 0929) Resp:  [8-22] 8 (03/07 1200) BP: (92-166)/(58-83) 133/63 (03/07 0900) SpO2:  [94 %-100 %] 98 % (03/07 1200) Arterial Line BP: (117-205)/(48-81) 127/52 (03/07 0929) Last BM Date:  (PTA)  Intake/Output from previous day: 03/06 0701 - 03/07 0700 In: 5150 [I.V.:4100; NG/GT:90; IV Piggyback:900] Out: 2155 [Urine:1430; Emesis/NG output:50; Drains:175; Blood:500] Intake/Output this shift: Total I/O In: 363.3 [I.V.:333.3; Other:10; NG/GT:20] Out: 195 [Urine:155; Drains:40]  General appearance: alert, cooperative and mild distress Resp: breathing comfortably Cardio: regular rate and rhythm GI: soft, approp tender.  dressing c/d/i.  JP serosang.  mildly distended.   Lab Results:   Recent Labs  04/23/16 1356 04/24/16 0359  WBC  --  13.6*  HGB 8.2* 8.4*  HCT 24.0* 27.1*  PLT  --  274   BMET  Recent Labs  04/23/16 1356 04/24/16 0359  NA 144 140  K 4.2 5.1  CL  --  111  CO2  --  18*  GLUCOSE  --  185*  BUN  --  50*  CREATININE  --  2.75*  CALCIUM  --  7.6*   PT/INR  Recent Labs  04/24/16 0359  LABPROT 14.7  INR 1.15   ABG  Recent Labs  04/23/16 1356  PHART 7.309*  HCO3 19.6*    Studies/Results: No results found.  Anti-infectives: Anti-infectives    Start     Dose/Rate Route Frequency Ordered Stop   04/23/16 1830  ceFAZolin (ANCEF) IVPB 2g/100 mL premix     2 g 200 mL/hr over 30 Minutes Intravenous Every 8 hours 04/23/16 1701 04/23/16 1828   04/23/16 0747  ceFAZolin (ANCEF) IVPB 2g/100 mL premix     2 g 200 mL/hr over 30 Minutes Intravenous On call to O.R. 04/23/16 0747 04/23/16 1027      Assessment/Plan: s/p Procedure(s): WHIPPLE PROCEDURE (N/A) Continue foley due to patient in ICU and  epidural antihypertensives\  Continue NGT/NPO Hyperkalemia - remove K from IV fluids.   Ambulate/OOB IS Hyperglycemia - stress induced and significant reduction of functional pancreatic tissue with surgery PCA/epidural for pain control.    LOS: 1 day    [facility] 04/24/2016

## 2016-04-24 NOTE — Anesthesia Post-op Follow-up Note (Signed)
  Anesthesia Pain Follow-up Note  Patient: Kim Burns  Day #: 1  Date of Follow-up: 04/24/2016 Time: 10:25 AM  Last Vitals:  Vitals:   04/24/16 0900 04/24/16 0929  BP: 133/63   Pulse: 85 84  Resp: 14 (!) 8  Temp:      Level of Consciousness: alert  Pain: moderate   Side Effects:None  Catheter Site Exam:clean, dry  Epidural / Intrathecal    Start     Dose/Rate Route Frequency Ordered Stop   04/23/16 1945  ropivacaine (PF) 2 mg/mL (0.2%) (NAROPIN) injection     5 mL/hr 5 mL/hr  Epidural Continuous 04/23/16 1931         Plan: Modify therapy to improve pain control at surgeon's request. Increased infusion to 7cc/hr  [facility] DANIEL

## 2016-04-24 NOTE — Care Management Note (Signed)
Case Management Note  Patient Details  Name: Kim Burns MRN: [id number] Date of Birth: [birth year]  Subjective/Objective:    Pod 1 of whipple, patient lives at home alone, NCM spoke with patient's son Masen Salvas  [phone], he states the plan is for her to get rehab when she is ready for discharge, he was not sure of what type (snf vs cir) patient will need a pt/ot eval when can tolerate.  She has a daughter , Milana Obey also [phone]. She has d5, dilaudid pca, ng tube, conts to be npo. Pain is her issue right now.  NCM will cont to follow for dc needs.                Action/Plan:   Expected Discharge Date:                  Expected Discharge Plan:  Skilled Nursing Facility  In-House Referral:  Clinical Social Work  Discharge planning Services  CM Consult  Post Acute Care Choice:    Choice offered to:     DME Arranged:    DME Agency:     HH Arranged:    [place] Agency:     Status of Service:  In process, will continue to follow  If discussed at Long Length of Stay Meetings, dates discussed:    Additional Comments:  Zenon Mayo, RN 04/24/2016, 2:44 PM

## 2016-04-25 LAB — GLUCOSE, CAPILLARY
GLUCOSE-CAPILLARY: 127 mg/dL — AB (ref 65–99)
GLUCOSE-CAPILLARY: 133 mg/dL — AB (ref 65–99)
GLUCOSE-CAPILLARY: 148 mg/dL — AB (ref 65–99)
Glucose-Capillary: 177 mg/dL — ABNORMAL HIGH (ref 65–99)
Glucose-Capillary: 150 mg/dL — ABNORMAL HIGH (ref 65–99)
Glucose-Capillary: 136 mg/dL — ABNORMAL HIGH (ref 65–99)

## 2016-04-25 LAB — COMPREHENSIVE METABOLIC PANEL
ALT: 36 U/L (ref 14–54)
ANION GAP: 6 (ref 5–15)
AST: 63 U/L — ABNORMAL HIGH (ref 15–41)
Albumin: 3 g/dL — ABNORMAL LOW (ref 3.5–5.0)
Alkaline Phosphatase: 18 U/L — ABNORMAL LOW (ref 38–126)
BUN: 46 mg/dL — ABNORMAL HIGH (ref 6–20)
CHLORIDE: 113 mmol/L — AB (ref 101–111)
CO2: 21 mmol/L — AB (ref 22–32)
CREATININE: 2.72 mg/dL — AB (ref 0.44–1.00)
Calcium: 8 mg/dL — ABNORMAL LOW (ref 8.9–10.3)
GFR, EST AFRICAN AMERICAN: 18 mL/min — AB (ref >60)
GFR, EST NON AFRICAN AMERICAN: 16 mL/min — AB (ref >60)
Glucose, Bld: 198 mg/dL — ABNORMAL HIGH (ref 65–99)
POTASSIUM: 4.7 mmol/L (ref 3.5–5.1)
SODIUM: 140 mmol/L (ref 135–145)
Total Bilirubin: 0.4 mg/dL (ref 0.3–1.2)
Total Protein: 5.3 g/dL — ABNORMAL LOW (ref 6.5–8.1)

## 2016-04-25 LAB — CBC
HCT: 23.9 % — ABNORMAL LOW (ref 36.0–46.0)
Hemoglobin: 7.4 g/dL — ABNORMAL LOW (ref 12.0–15.0)
MCH: 29.1 pg (ref 26.0–34.0)
MCHC: 31 g/dL (ref 30.0–36.0)
MCV: 94.1 fL (ref 78.0–100.0)
PLATELETS: 202 10*3/uL (ref 150–400)
RBC: 2.54 MIL/uL — ABNORMAL LOW (ref 3.87–5.11)
RDW: 15.9 % — AB (ref 11.5–15.5)
WBC: 10.2 10*3/uL (ref 4.0–10.5)

## 2016-04-25 MED ORDER — ACETAMINOPHEN 10 MG/ML IV SOLN
1000.0000 mg | Freq: Once | INTRAVENOUS | Status: AC
  Administered 2016-04-25: 1000 mg via INTRAVENOUS
  Filled 2016-04-25: qty 100

## 2016-04-25 MED ORDER — ESCITALOPRAM OXALATE 10 MG PO TABS
5.0000 mg | ORAL_TABLET | Freq: Every day | ORAL | Status: DC
  Administered 2016-04-26 – 2016-05-05 (×9): 5 mg via ORAL
  Filled 2016-04-25 (×9): qty 1

## 2016-04-25 MED ORDER — SODIUM CHLORIDE 0.9 % IV SOLN
INTRAVENOUS | Status: DC
  Administered 2016-04-25 – 2016-04-27 (×3): via EPIDURAL
  Filled 2016-04-25 (×12): qty 25

## 2016-04-25 NOTE — Anesthesia Post-op Follow-up Note (Signed)
  Anesthesia Pain Follow-up Note  Patient: Kim Burns  Day #: 2  Date of Follow-up: 04/25/2016 Time: 8:39 AM  Last Vitals:  Vitals:   04/25/16 0700 04/25/16 0809  BP: 137/65   Pulse: 98   Resp: 13   Temp:  37.2 C    Level of Consciousness: lethargic  Pain: moderate   Side Effects:None  Catheter Site Exam:clean  Epidural / Intrathecal    Start     Dose/Rate Route Frequency Ordered Stop   04/23/16 1945  ropivacaine (PF) 2 mg/mL (0.2%) (NAROPIN) injection     5 mL/hr 5 mL/hr  Epidural Continuous 04/23/16 1931         Plan: Temp level to T6-L1 on R, no temp level on L Will increase infusion 2cc/hr Per surgeon request  Damel Querry S

## 2016-04-25 NOTE — Addendum Note (Signed)
Addendum  created 04/25/16 [id number] by Myrtie Soman, MD   Sign clinical note

## 2016-04-25 NOTE — Progress Notes (Signed)
2 Days Post-Op   Subjective: Some nausea.  Sore upper abdomen.    Objective: Vital signs in last 24 hours: Temp:  [98.1 F (36.7 C)-99.3 F (37.4 C)] 99.3 F (37.4 C) (03/08 1204) Pulse Rate:  [81-104] 93 (03/08 0900) Resp:  [8-22] 16 (03/08 1200) BP: (108-156)/(49-80) 149/69 (03/08 1000) SpO2:  [93 %-99 %] 96 % (03/08 1200) Arterial Line BP: (122-195)/(47-70) 195/67 (03/08 1000) Last BM Date:  (PTA)  Intake/Output from previous day: 03/07 0701 - 03/08 0700 In: 2514 [I.V.:2300; NG/GT:60] Out: 1715 [Urine:1485; Emesis/NG output:100; Drains:130] Intake/Output this shift: Total I/O In: 452 [I.V.:400; Other:32; NG/GT:20] Out: 280 [Urine:250; Emesis/NG output:30]  General appearance: alert, cooperative and mild distress Resp: breathing comfortably Cardio: regular rate and rhythm GI: soft, approp tender.  dressing c/d/i.  JP serosang.  mildly distended.   Lab Results:   Recent Labs  04/24/16 0359 04/25/16 0405  WBC 13.6* 10.2  HGB 8.4* 7.4*  HCT 27.1* 23.9*  PLT 274 202   BMET  Recent Labs  04/24/16 0359 04/25/16 0405  NA 140 140  K 5.1 4.7  CL 111 113*  CO2 18* 21*  GLUCOSE 185* 198*  BUN 50* 46*  CREATININE 2.75* 2.72*  CALCIUM 7.6* 8.0*   PT/INR  Recent Labs  04/24/16 0359  LABPROT 14.7  INR 1.15   ABG  Recent Labs  04/23/16 1356  PHART 7.309*  HCO3 19.6*    Studies/Results: No results found.  Anti-infectives: Anti-infectives    Start     Dose/Rate Route Frequency Ordered Stop   04/23/16 1830  ceFAZolin (ANCEF) IVPB 2g/100 mL premix     2 g 200 mL/hr over 30 Minutes Intravenous Every 8 hours 04/23/16 1701 04/23/16 1828   04/23/16 0747  ceFAZolin (ANCEF) IVPB 2g/100 mL premix     2 g 200 mL/hr over 30 Minutes Intravenous On call to O.R. 04/23/16 0747 04/23/16 1027      Assessment/Plan: s/p Procedure(s): WHIPPLE PROCEDURE (N/A) Continue foley due to patient in ICU and epidural antihypertensives\  D/c ngt.  Ice chips and sips.     Hyperkalemia - resolved.   Ambulate/OOB IS Hyperglycemia - stress induced and significant reduction of functional pancreatic tissue with surgery.  Add low dose lantus ABL anemia - monitor.  May need transfusion. Chronic kidney disease - stable.   PCA/epidural for pain control.   Transfer to stepdown.    LOS: 2 days    [facility] 04/25/2016

## 2016-04-26 DIAGNOSIS — R5381 Other malaise: Secondary | ICD-10-CM

## 2016-04-26 LAB — GLUCOSE, CAPILLARY
GLUCOSE-CAPILLARY: 132 mg/dL — AB (ref 65–99)
GLUCOSE-CAPILLARY: 146 mg/dL — AB (ref 65–99)
GLUCOSE-CAPILLARY: 124 mg/dL — AB (ref 65–99)
GLUCOSE-CAPILLARY: 128 mg/dL — AB (ref 65–99)
Glucose-Capillary: 144 mg/dL — ABNORMAL HIGH (ref 65–99)
Glucose-Capillary: 127 mg/dL — ABNORMAL HIGH (ref 65–99)

## 2016-04-26 LAB — COMPREHENSIVE METABOLIC PANEL
ALT: 26 U/L (ref 14–54)
ANION GAP: 6 (ref 5–15)
AST: 36 U/L (ref 15–41)
Albumin: 2.7 g/dL — ABNORMAL LOW (ref 3.5–5.0)
Alkaline Phosphatase: 18 U/L — ABNORMAL LOW (ref 38–126)
BUN: 31 mg/dL — ABNORMAL HIGH (ref 6–20)
CHLORIDE: 112 mmol/L — AB (ref 101–111)
CO2: 22 mmol/L (ref 22–32)
Calcium: 8.6 mg/dL — ABNORMAL LOW (ref 8.9–10.3)
Creatinine, Ser: 2.11 mg/dL — ABNORMAL HIGH (ref 0.44–1.00)
GFR, EST AFRICAN AMERICAN: 25 mL/min — AB (ref >60)
GFR, EST NON AFRICAN AMERICAN: 21 mL/min — AB (ref >60)
Glucose, Bld: 153 mg/dL — ABNORMAL HIGH (ref 65–99)
POTASSIUM: 3.9 mmol/L (ref 3.5–5.1)
SODIUM: 140 mmol/L (ref 135–145)
Total Bilirubin: 0.4 mg/dL (ref 0.3–1.2)
Total Protein: 5 g/dL — ABNORMAL LOW (ref 6.5–8.1)

## 2016-04-26 LAB — CBC
HCT: 22.1 % — ABNORMAL LOW (ref 36.0–46.0)
Hemoglobin: 7.1 g/dL — ABNORMAL LOW (ref 12.0–15.0)
MCH: 29.6 pg (ref 26.0–34.0)
MCHC: 32.1 g/dL (ref 30.0–36.0)
MCV: 92.1 fL (ref 78.0–100.0)
PLATELETS: 202 10*3/uL (ref 150–400)
RBC: 2.4 MIL/uL — AB (ref 3.87–5.11)
RDW: 15.1 % (ref 11.5–15.5)
WBC: 9 10*3/uL (ref 4.0–10.5)

## 2016-04-26 MED ORDER — INSULIN GLARGINE 100 UNIT/ML ~~LOC~~ SOLN
4.0000 [IU] | Freq: Every day | SUBCUTANEOUS | Status: DC
  Administered 2016-04-26 – 2016-04-28 (×3): 4 [IU] via SUBCUTANEOUS
  Filled 2016-04-26 (×4): qty 0.04

## 2016-04-26 MED ORDER — CALCITRIOL 0.25 MCG PO CAPS
0.2500 ug | ORAL_CAPSULE | ORAL | Status: DC
  Administered 2016-04-29 – 2016-05-03 (×3): 0.25 ug via ORAL
  Filled 2016-04-26 (×7): qty 1

## 2016-04-26 MED ORDER — FEBUXOSTAT 40 MG PO TABS
80.0000 mg | ORAL_TABLET | Freq: Every morning | ORAL | Status: DC
  Administered 2016-04-26 – 2016-05-06 (×11): 80 mg via ORAL
  Filled 2016-04-26 (×11): qty 2

## 2016-04-26 MED ORDER — HYDRALAZINE HCL 25 MG PO TABS
25.0000 mg | ORAL_TABLET | Freq: Three times a day (TID) | ORAL | Status: DC
  Administered 2016-04-26 – 2016-05-06 (×30): 25 mg via ORAL
  Filled 2016-04-26 (×32): qty 1

## 2016-04-26 MED ORDER — CINACALCET HCL 30 MG PO TABS
30.0000 mg | ORAL_TABLET | Freq: Every day | ORAL | Status: DC
  Administered 2016-04-27 – 2016-05-05 (×9): 30 mg via ORAL
  Filled 2016-04-26 (×11): qty 1

## 2016-04-26 NOTE — Anesthesia Post-op Follow-up Note (Signed)
  Anesthesia Pain Follow-up Note  Patient: Kim Burns  Day #: 3  Date of Follow-up: 04/26/2016 Time: 11:27 AM  Last Vitals:  Vitals:   04/26/16 1000 04/26/16 1100  BP: (!) 157/76 (!) 166/80  Pulse: 85 83  Resp: 16 11  Temp:      Level of Consciousness: alert  Pain: mild   Side Effects:None  Catheter Site Exam:clean, dry, no drainage  Epidural / Intrathecal    Start     Dose/Rate Route Frequency Ordered Stop   04/25/16 1830  bupivacaine (SENSORCAINE-MPF) 0.125 % in sodium chloride 0.9 % 150 mL epidural     10 mL/hr  Epidural Continuous 04/25/16 1737         Plan: Continue current therapy of postop epidural at surgeon's request  Tiajuana Amass

## 2016-04-26 NOTE — Progress Notes (Signed)
3 Days Post-Op   Subjective: NGT removed.  Mild nausea when gets up.     Objective: Vital signs in last 24 hours: Temp:  [97.9 F (36.6 C)-99.3 F (37.4 C)] 97.9 F (36.6 C) (03/09 0841) Pulse Rate:  [85-100] 91 (03/09 0900) Resp:  [12-23] 12 (03/09 0900) BP: (118-195)/(60-90) 156/79 (03/09 0900) SpO2:  [91 %-98 %] 91 % (03/09 0900) Arterial Line BP: (126-201)/(48-83) 176/67 (03/09 0900) Last BM Date:  (PTA)  Intake/Output from previous day: 03/08 0701 - 03/09 0700 In: 2672 [P.O.:150; I.V.:2400; NG/GT:20] Out: 2750 [Urine:2120; Emesis/NG output:530; Drains:100] Intake/Output this shift: Total I/O In: 300 [I.V.:300] Out: 250 [Urine:250]  General appearance: alert, cooperative and mild distress Resp: breathing comfortably Cardio: regular rate and rhythm GI: soft, approp tender.  dressing c/d/i.  JP serosang.  nondistended  Lab Results:   Recent Labs  04/25/16 0405 04/26/16 0425  WBC 10.2 9.0  HGB 7.4* 7.1*  HCT 23.9* 22.1*  PLT 202 202   BMET  Recent Labs  04/25/16 0405 04/26/16 0425  NA 140 140  K 4.7 3.9  CL 113* 112*  CO2 21* 22  GLUCOSE 198* 153*  BUN 46* 31*  CREATININE 2.72* 2.11*  CALCIUM 8.0* 8.6*   PT/INR  Recent Labs  04/24/16 0359  LABPROT 14.7  INR 1.15   ABG  Recent Labs  04/23/16 1356  PHART 7.309*  HCO3 19.6*    Studies/Results: No results found.  Anti-infectives: Anti-infectives    Start     Dose/Rate Route Frequency Ordered Stop   04/23/16 1830  ceFAZolin (ANCEF) IVPB 2g/100 mL premix     2 g 200 mL/hr over 30 Minutes Intravenous Every 8 hours 04/23/16 1701 04/23/16 1828   04/23/16 0747  ceFAZolin (ANCEF) IVPB 2g/100 mL premix     2 g 200 mL/hr over 30 Minutes Intravenous On call to O.R. 04/23/16 0747 04/23/16 1027      Assessment/Plan: s/p Procedure(s): WHIPPLE PROCEDURE (N/A) Add back some oral meds. antihypertensives\ Clear liquids.    Hyperkalemia - resolved.   Ambulate/OOB IS Hyperglycemia - stress  induced and significant reduction of functional pancreatic tissue with surgery.  Add low dose lantus ABL anemia - monitor.  May need transfusion. Chronic kidney disease - stable.   PCA/epidural for pain control.   Awaiting stepdown bed.     LOS: 3 days    [facility] 04/26/2016

## 2016-04-26 NOTE — Progress Notes (Signed)
Rehab admissions - Awaiting PT/OT evaluations and recommendations.  Will follow up once evaluations are completed.  Call me for questions.  #[phone]

## 2016-04-26 NOTE — Progress Notes (Signed)
Patient had episode of green emesis, 400cc plus an estimated 100-200cc unmeasurable. Patient received 4mg  zofran and nausea decreased. Barry Dienes, MD aware. RN to notify MD if patient to have another episode of emesis. Will assess the need for NG tube placement if patient has another episode of emesis.   Basil Dess, RN

## 2016-04-26 NOTE — Consult Note (Signed)
Physical Medicine and Rehabilitation Consult Reason for Consult: Debilitation/pancreatic mass/multi-medical Referring Physician: Dr. Barry Dienes   HPI: Kim Burns is a 81 y.o. right handed female with history of CKD stage III, bradycardia, TIA in the mid 90s. Independent prior to admission living alone. She has a granddaughter that can assist. Presented 04/23/2016 with known pancreatic mass followed by general surgery discovered 2015 after undergoing evaluation for hematuria and kidney stone. She had an MRI 6 months after that that showed minimal change in size of mass. Most recent MRI showed enlargement of the mass and progressive dilation of the main pancreatic duct. She denied any.abdominal pain. Underwent pancreaticoduodenectomy/Whipple procedure with placement of pancreatic stent as well as repair of umbilical hernia and partial closure 04/23/2016 per Dr. Barry Dienes. Hospital course pain management. Acute blood loss anemia patient was transfused. Renal function remained stable. Currently on a clear liquid diet. Physical and occupational therapy evaluations pending with consultation made for debilitation. M.D. has requested physical medicine rehabilitation consult.   Review of Systems  Constitutional: Negative for chills and fever.  HENT: Negative for hearing loss.   Eyes: Negative for blurred vision and double vision.  Respiratory: Negative for cough and shortness of breath.   Cardiovascular: Negative for chest pain and palpitations.  Gastrointestinal: Positive for constipation and nausea. Negative for vomiting.  Genitourinary: Positive for hematuria. Negative for dysuria and flank pain.  Musculoskeletal: Positive for myalgias.  Skin: Negative for rash.  Neurological: Positive for weakness and headaches. Negative for seizures.  All other systems reviewed and are negative.  Past Medical History:  Diagnosis Date  . Anemia of chronic disease 1985   hysterectomy fibroid tumors  .  Bradycardia   . CKD (chronic kidney disease) stage 3, GFR 30-59 ml/min    sees dr Arty Baumgartner sees next in jan 2018  . Dysrhythmia 04/16/2016   bradycardia due to medication   . GERD (gastroesophageal reflux disease)   . Gout   . Headache   . History of kidney stones 06/2013  . Hyperparathyroidism ([place])   . Hypertension   . Itching    last year  . PONV (postoperative nausea and vomiting)   . Sleep apnea    no cpap machine. could not tolerate  . Stroke ([place]) 04/16/2016   TIA 1995  . Thyroid disease   . TIA (transient ischemic attack) 1996   x 1  . Vitamin D deficiency    Past Surgical History:  Procedure Laterality Date  . ABDOMINAL HYSTERECTOMY     complete  . APPENDECTOMY     with gallbladder  . BACK SURGERY     lower  . BREAST LUMPECTOMY Left x 2   many years apart, benign  . CHOLECYSTECTOMY    . CYSTOSCOPY WITH URETEROSCOPY AND STENT PLACEMENT Left 06/18/2013   Procedure: CYSTOSCOPY WITH Lef URETEROSCOPY AND Left STENT PLACEMENT;  Surgeon: Dutch Gray, MD;  Location: [facility];  Service: Urology;  Laterality: Left;  . EUS N/A 02/07/2016   Procedure: ESOPHAGEAL ENDOSCOPIC ULTRASOUND (EUS) RADIAL;  Surgeon: Arta Silence, MD;  Location: [facility] END[facility];  Service: Endoscopy;  Laterality: N/A;  . EYE SURGERY Bilateral 2014   ioc for catracts   . PARATHYROID EXPLORATION    . WHIPPLE PROCEDURE N/A 04/23/2016   Procedure: WHIPPLE PROCEDURE;  Surgeon: Stark Klein, MD;  Location: [facility] OR;  Service: General;  Laterality: N/A;   Family History  Problem Relation Age of Onset  . Heart disease Mother   . Stroke Mother   .  Cancer Father     colon  . Alzheimer's disease Sister   . Cancer Brother     brain  . Alzheimer's disease Sister    Social History:  reports that she has never smoked. She has never used smokeless tobacco. She reports that she does not drink alcohol or use drugs. Allergies:  Allergies  Allergen Reactions  . Codeine Other (See Comments)    headache  . Doxycycline      unknown  . Gemfibrozil Other (See Comments)    Myalgia   . Hydrocodone-Acetaminophen     unknown  . Lotemax [Loteprednol Etabonate]     burning  . Statins Other (See Comments)    Muscle weakness   Medications Prior to Admission  Medication Sig Dispense Refill  . Artificial Tear Solution (GENTEAL TEARS OP) Apply 1 drop to eye 3 (three) times daily as needed (dry eyes).     . calcitRIOL (ROCALTROL) 0.25 MCG capsule Take 0.25 mcg by mouth every Monday, Wednesday, and Friday. At night    . cinacalcet (SENSIPAR) 30 MG tablet Take 30 mg by mouth daily with supper.    . clopidogrel (PLAVIX) 75 MG tablet Take 75 mg by mouth every morning.    . diclofenac sodium (VOLTAREN) 1 % GEL Apply 2 g topically 4 (four) times daily as needed (hand pain).     Marland Kitchen diphenhydramine-acetaminophen (TYLENOL PM) 25-500 MG TABS Take 2 tablets by mouth at bedtime as needed (Sleep).     Marland Kitchen escitalopram (LEXAPRO) 5 MG tablet Take 5 mg by mouth at bedtime.    . Febuxostat (ULORIC) 80 MG TABS Take 80 mg by mouth every morning.     . fenofibrate (TRICOR) 145 MG tablet Take 145 mg by mouth every morning.     . hydrALAZINE (APRESOLINE) 25 MG tablet Take 25 mg by mouth 3 (three) times daily.    . Omega-3 Fatty Acids (FISH OIL) 1200 MG CAPS Take 2,400 mg by mouth daily.    Marland Kitchen telmisartan (MICARDIS) 80 MG tablet Take 80 mg by mouth at bedtime.     . Vitamin D, Ergocalciferol, (DRISDOL) 50000 UNITS CAPS capsule Take 50,000 Units by mouth every 7 (seven) days. Wednesdays    . acetaminophen (TYLENOL) 500 MG tablet Take 1,000 mg by mouth every 6 (six) hours as needed for moderate pain.    Marland Kitchen azelastine (ASTELIN) 0.1 % nasal spray Place 1 spray into both nostrils at bedtime as needed for rhinitis. Use in each nostril as directed    . clobetasol (TEMOVATE) 0.05 % GEL Apply 1 application topically daily as needed (itching).      Home:    Functional History:   Functional Status:  Mobility:          ADL:     Cognition: Cognition Orientation Level: Oriented X4    Blood pressure (!) 156/79, pulse 91, temperature 97.9 F (36.6 C), temperature source Oral, resp. rate 12, SpO2 91 %. Physical Exam  Vitals reviewed. Constitutional: She is oriented to person, place, and time. She appears well-developed.  HENT:  Head: Normocephalic.  Eyes: EOM are normal.  Neck: Normal range of motion. Neck supple. No thyromegaly present.  Cardiovascular: Normal rate and regular rhythm.   Respiratory: Effort normal and breath sounds normal. No respiratory distress.  GI: Soft. Bowel sounds are normal. She exhibits no distension.  Abdomen tender with touch/movement  Neurological: She is alert and oriented to person, place, and time.  Motor grossly 4/5 in upper ext and 3/5 to  4/5 in lower extremities exacerbated by abdominal discomfort  Skin: Skin is warm.  Abdominal incision is dressed appropriately tender  Psychiatric: She has a normal mood and affect. Her behavior is normal.    Results for orders placed or performed during the hospital encounter of 04/23/16 (from the past 24 hour(s))  Glucose, capillary     Status: Abnormal   Collection Time: 04/25/16 12:03 PM  Result Value Ref Range   Glucose-Capillary 136 (H) 65 - 99 mg/dL   Comment 1 Capillary Specimen    Comment 2 Notify RN   Glucose, capillary     Status: Abnormal   Collection Time: 04/25/16  3:35 PM  Result Value Ref Range   Glucose-Capillary 148 (H) 65 - 99 mg/dL   Comment 1 Capillary Specimen    Comment 2 Notify RN   Glucose, capillary     Status: Abnormal   Collection Time: 04/25/16  7:16 PM  Result Value Ref Range   Glucose-Capillary 133 (H) 65 - 99 mg/dL   Comment 1 Capillary Specimen    Comment 2 Notify RN    Comment 3 Document in Chart   Glucose, capillary     Status: Abnormal   Collection Time: 04/25/16 11:55 PM  Result Value Ref Range   Glucose-Capillary 127 (H) 65 - 99 mg/dL   Comment 1 Capillary Specimen    Comment 2 Notify  RN    Comment 3 Document in Chart   Glucose, capillary     Status: Abnormal   Collection Time: 04/26/16  3:34 AM  Result Value Ref Range   Glucose-Capillary 132 (H) 65 - 99 mg/dL   Comment 1 Capillary Specimen    Comment 2 Notify RN    Comment 3 Document in Chart   CBC     Status: Abnormal   Collection Time: 04/26/16  4:25 AM  Result Value Ref Range   WBC 9.0 4.0 - 10.5 K/uL   RBC 2.40 (L) 3.87 - 5.11 MIL/uL   Hemoglobin 7.1 (L) 12.0 - 15.0 g/dL   HCT 22.1 (L) 36.0 - 46.0 %   MCV 92.1 78.0 - 100.0 fL   MCH 29.6 26.0 - 34.0 pg   MCHC 32.1 30.0 - 36.0 g/dL   RDW 15.1 11.5 - 15.5 %   Platelets 202 150 - 400 K/uL  Comprehensive metabolic panel     Status: Abnormal   Collection Time: 04/26/16  4:25 AM  Result Value Ref Range   Sodium 140 135 - 145 mmol/L   Potassium 3.9 3.5 - 5.1 mmol/L   Chloride 112 (H) 101 - 111 mmol/L   CO2 22 22 - 32 mmol/L   Glucose, Bld 153 (H) 65 - 99 mg/dL   BUN 31 (H) 6 - 20 mg/dL   Creatinine, Ser 2.11 (H) 0.44 - 1.00 mg/dL   Calcium 8.6 (L) 8.9 - 10.3 mg/dL   Total Protein 5.0 (L) 6.5 - 8.1 g/dL   Albumin 2.7 (L) 3.5 - 5.0 g/dL   AST 36 15 - 41 U/L   ALT 26 14 - 54 U/L   Alkaline Phosphatase 18 (L) 38 - 126 U/L   Total Bilirubin 0.4 0.3 - 1.2 mg/dL   GFR calc non Af Amer 21 (L) >60 mL/min   GFR calc Af Amer 25 (L) >60 mL/min   Anion gap 6 5 - 15  Glucose, capillary     Status: Abnormal   Collection Time: 04/26/16  8:35 AM  Result Value Ref Range   Glucose-Capillary 146 (  H) 65 - 99 mg/dL   Comment 1 Capillary Specimen    Comment 2 Notify RN    No results found.  Assessment/Plan: Diagnosis: debility related to pancreatic mass s/p whipple procedure 1. Does the need for close, 24 hr/day medical supervision in concert with the patient's rehab needs make it unreasonable for this patient to be served in a less intensive setting? Yes 2. Co-Morbidities requiring supervision/potential complications: ckd, wound care, pain mgt,  3. Due to bladder  management, bowel management, safety, skin/wound care, disease management, medication administration, pain management and patient education, does the patient require 24 hr/day rehab nursing? Yes 4. Does the patient require coordinated care of a physician, rehab nurse, PT (1-2 hrs/day, 5 days/week) and OT (1-2 hrs/day, 5 days/week) to address physical and functional deficits in the context of the above medical diagnosis(es)? Yes and Potentially Addressing deficits in the following areas: balance, endurance, locomotion, strength, transferring, bowel/bladder control, bathing, dressing, feeding, grooming, toileting and psychosocial support 5. Can the patient actively participate in an intensive therapy program of at least 3 hrs of therapy per day at least 5 days per week? Yes and Potentially 6. The potential for patient to make measurable gains while on inpatient rehab is good 7. Anticipated functional outcomes upon discharge from inpatient rehab are modified independent and supervision  with PT, modified independent, supervision and min assist with OT, n/a with SLP. 8. Estimated rehab length of stay to reach the above functional goals is: To be determined at this point 9. Does the patient have adequate social supports and living environment to accommodate these discharge functional goals? Potentially 10. Anticipated D/C setting: Home 11. Anticipated post D/C treatments: [place] therapy 12. Overall Rehab/Functional Prognosis: good  RECOMMENDATIONS: This patient's condition is appropriate for continued rehabilitative care in the following setting: see below Patient has agreed to participate in recommended program. Yes Note that insurance prior authorization may be required for reimbursement for recommended care.  Comment: Pt has not yet gotten up with therapies. Still having considerable pain. Has a local granddaughter who can provide intermittent check-ins. Out of town daughter [employer]chant [employer] officer) can stay with her  after school is finished in June. Will follow for medical and functional progress to more appropriately determine best postacute rehab venue.  Meredith Staggers, MD, [place] 04/26/2016   Cathlyn Parsons., PA-C 04/26/2016

## 2016-04-26 NOTE — Care Management Important Message (Signed)
Important Message  Patient Details  Name: Kim Burns MRN: [id number] Date of Birth: [birth year]   Medicare Important Message Given:  Yes    Menno Vanbergen Abena 04/26/2016, 2:17 PM

## 2016-04-26 NOTE — Progress Notes (Signed)
Patient's midline abdominal dressing changed due to emesis under previous dressing. Incision is clean, dry, and intact. New dressing is clean, dry, and intact. \  Basil Dess, RN

## 2016-04-26 NOTE — Progress Notes (Signed)
PT Cancellation Note  Patient Details Name: Kim Burns MRN: [id number] DOB: 23-Jul-1935   Cancelled Treatment:    Reason Eval/Treat Not Completed: Medical issues which prohibited therapy.  Patient nauseated and just vomited.  Patient declined PT.  Will return tomorrow for PT eval.   Despina Pole 04/26/2016, 5:47 PM Carita Pian. Sanjuana Kava, [place] Pager [phone]

## 2016-04-27 LAB — GLUCOSE, CAPILLARY
GLUCOSE-CAPILLARY: 132 mg/dL — AB (ref 65–99)
Glucose-Capillary: 129 mg/dL — ABNORMAL HIGH (ref 65–99)
Glucose-Capillary: 131 mg/dL — ABNORMAL HIGH (ref 65–99)
Glucose-Capillary: 132 mg/dL — ABNORMAL HIGH (ref 65–99)
Glucose-Capillary: 134 mg/dL — ABNORMAL HIGH (ref 65–99)
Glucose-Capillary: 154 mg/dL — ABNORMAL HIGH (ref 65–99)

## 2016-04-27 LAB — COMPREHENSIVE METABOLIC PANEL
ALT: 22 U/L (ref 14–54)
AST: 26 U/L (ref 15–41)
Albumin: 2.7 g/dL — ABNORMAL LOW (ref 3.5–5.0)
Alkaline Phosphatase: 23 U/L — ABNORMAL LOW (ref 38–126)
Anion gap: 8 (ref 5–15)
BUN: 22 mg/dL — ABNORMAL HIGH (ref 6–20)
CHLORIDE: 106 mmol/L (ref 101–111)
CO2: 22 mmol/L (ref 22–32)
CREATININE: 1.75 mg/dL — AB (ref 0.44–1.00)
Calcium: 8.8 mg/dL — ABNORMAL LOW (ref 8.9–10.3)
GFR calc non Af Amer: 27 mL/min — ABNORMAL LOW (ref >60)
GFR, EST AFRICAN AMERICAN: 31 mL/min — AB (ref >60)
Glucose, Bld: 148 mg/dL — ABNORMAL HIGH (ref 65–99)
POTASSIUM: 3.5 mmol/L (ref 3.5–5.1)
Sodium: 136 mmol/L (ref 135–145)
Total Bilirubin: 0.5 mg/dL (ref 0.3–1.2)
Total Protein: 5.4 g/dL — ABNORMAL LOW (ref 6.5–8.1)

## 2016-04-27 LAB — CBC
HCT: 22.6 % — ABNORMAL LOW (ref 36.0–46.0)
Hemoglobin: 7.3 g/dL — ABNORMAL LOW (ref 12.0–15.0)
MCH: 29.2 pg (ref 26.0–34.0)
MCHC: 32.3 g/dL (ref 30.0–36.0)
MCV: 90.4 fL (ref 78.0–100.0)
PLATELETS: 239 10*3/uL (ref 150–400)
RBC: 2.5 MIL/uL — ABNORMAL LOW (ref 3.87–5.11)
RDW: 14.6 % (ref 11.5–15.5)
WBC: 8 10*3/uL (ref 4.0–10.5)

## 2016-04-27 NOTE — Anesthesia Post-op Follow-up Note (Signed)
  Anesthesia Pain Follow-up Note  Patient: Kim Burns  Day #: 3  Date of Follow-up: 04/27/2016 Time: 12:59 PM  Last Vitals:  Vitals:   04/27/16 1140 04/27/16 1200  BP: 138/78   Pulse: 79   Resp: 15 14  Temp: 36.7 C     Level of Consciousness: alert  Pain: none   Side Effects:None  Catheter Site Exam:dry  Epidural / Intrathecal    Start     Dose/Rate Route Frequency Ordered Stop   04/25/16 1830  bupivacaine (SENSORCAINE-MPF) 0.125 % in sodium chloride 0.9 % 150 mL epidural     10 mL/hr  Epidural Continuous 04/25/16 1737         Plan: D/C from anesthesia care at surgeon's request Epidural pulled out when moving as per her nurse. To [facility]

## 2016-04-27 NOTE — Progress Notes (Signed)
4 Days Post-Op   Subjective: Threw up twice.  Not feeling nauseated now.  Passing some flatus.    Objective: Vital signs in last 24 hours: Temp:  [97.8 F (36.6 C)-98.4 F (36.9 C)] 98.4 F (36.9 C) (03/10 0300) Pulse Rate:  [80-91] 83 (03/10 0300) Resp:  [11-37] 16 (03/10 0614) BP: (134-171)/(69-98) 138/98 (03/10 0300) SpO2:  [91 %-100 %] 94 % (03/10 0614) Arterial Line BP: (176)/(67) 176/67 (03/09 0900) Weight:  [69 kg (152 lb 1.9 oz)] 69 kg (152 lb 1.9 oz) (03/09 1356) Last BM Date:  (PTA)  Intake/Output from previous day: 03/09 0701 - 03/10 0700 In: 2340 [P.O.:240; I.V.:2100] Out: 2330 [Urine:1775; Emesis/NG output:400; Drains:155] Intake/Output this shift: No intake/output data recorded.  General appearance: alert, cooperative and no distress Resp: breathing comfortably Cardio: regular rate and rhythm GI: soft, approp tender.  dressing c/d/i.  JP serosang.  Mild abd distention.  Lab Results:   Recent Labs  04/26/16 0425 04/27/16 0220  WBC 9.0 8.0  HGB 7.1* 7.3*  HCT 22.1* 22.6*  PLT 202 239   BMET  Recent Labs  04/26/16 0425 04/27/16 0220  NA 140 136  K 3.9 3.5  CL 112* 106  CO2 22 22  GLUCOSE 153* 148*  BUN 31* 22*  CREATININE 2.11* 1.75*  CALCIUM 8.6* 8.8*   PT/INR No results for input(s): LABPROT, INR in the last 72 hours. ABG No results for input(s): PHART, HCO3 in the last 72 hours.  Invalid input(s): PCO2, PO2  Studies/Results: No results found.  Anti-infectives: Anti-infectives    Start     Dose/Rate Route Frequency Ordered Stop   04/23/16 1830  ceFAZolin (ANCEF) IVPB 2g/100 mL premix     2 g 200 mL/hr over 30 Minutes Intravenous Every 8 hours 04/23/16 1701 04/23/16 1828   04/23/16 0747  ceFAZolin (ANCEF) IVPB 2g/100 mL premix     2 g 200 mL/hr over 30 Minutes Intravenous On call to O.R. 04/23/16 0747 04/23/16 1027      Assessment/Plan: s/p Procedure(s): WHIPPLE PROCEDURE (N/A). antihypertensives\ Clear liquids  sparingly Hyperkalemia - resolved.   Ambulate/OOB IS Hyperglycemia - stress induced and significant reduction of functional pancreatic tissue with surgery.  Add low dose lantus ABL anemia - monitor.  May need transfusion. Chronic kidney disease - stable.  Actually improved with IV fluids. Epidural out today.  Continue PCA for pain control.   PT/OT.  ? Rehab vs SNF placement as she lives alone.       LOS: 4 days    [facility] 04/27/2016

## 2016-04-27 NOTE — Evaluation (Addendum)
Physical Therapy Evaluation Patient Details Name: Kim Burns MRN: [id number] DOB: 1935/06/17 Today's Date: 04/27/2016   History of Present Illness  Pt is a 81 y/o female s/p whipple procedure secondary to pancreatic mass. PMH including but not limited to CKD, HTN and hx of CVA  Clinical Impression  Pt presented supine in bed with HOB elevated, awake and willing to participate in therapy session. Prior to admission, pt reported that she was independent with all functional mobility. Pt lives alone and has a full flight of stairs to enter her home, plus a full flight of stairs to get to her bedroom and bathroom. Pt currently requires mod A x2 for bed mobility and transfers, as well as min A with ambulation using a RW. All VSS throughout. Pt would continue to benefit from skilled physical therapy services at this time while admitted and after d/c to address her below listed limitations in order to improve her overall safety and independence with functional mobility.      Follow Up Recommendations SNF;Supervision/Assistance - 24 hour    Equipment Recommendations  None recommended by PT;Other (comment) (defer to next venue)    Recommendations for Other Services       Precautions / Restrictions Precautions Precautions: Fall Precaution Comments: epidural, abdominal drains x2 Restrictions Weight Bearing Restrictions: No      Mobility  Bed Mobility Overal bed mobility: Needs Assistance Bed Mobility: Supine to Sit     Supine to sit: Mod assist;+2 for physical assistance     General bed mobility comments: increased time, VC'ing for sequencing, use of bed rails and mod A x2 to achieve sitting upright at EOB with use of bed pads to position hips  Transfers Overall transfer level: Needs assistance Equipment used: Rolling walker (2 wheeled) Transfers: Sit to/from Stand Sit to Stand: Mod assist;+2 physical assistance;+2 safety/equipment         General transfer comment: increased  time, VC'ing for bilateral hand placement and mod A x2 to rise from bed  Ambulation/Gait Ambulation/Gait assistance: Min assist Ambulation Distance (Feet): 40 Feet (40' x3 with standing rest breaks) Assistive device: Rolling walker (2 wheeled) Gait Pattern/deviations: Step-to pattern;Step-through pattern;Decreased step length - right;Decreased step length - left;Decreased stride length;Shuffle;Drifts right/left Gait velocity: decreased Gait velocity interpretation: Below normal speed for age/gender General Gait Details: pt with very slow cadence and very short step length bilaterally. With VC'ing, pt minimally increasing speed and stride length. Pt also requiring min A for safety with RW as pt drifted L and frequently running into objects.  Stairs            Wheelchair Mobility    Modified Rankin (Stroke Patients Only)       Balance Overall balance assessment: Needs assistance Sitting-balance support: Feet supported Sitting balance-Leahy Scale: Fair     Standing balance support: During functional activity;Bilateral upper extremity supported Standing balance-Leahy Scale: Poor Standing balance comment: pt reliant on bilateral UEs on RW                             Pertinent Vitals/Pain Pain Assessment: 0-10 Pain Score: 4  Pain Location: abdomen Pain Descriptors / Indicators: Sore Pain Intervention(s): Monitored during session;Repositioned    Home Living Family/patient expects to be discharged to:: Skilled nursing facility                      Prior Function Level of Independence: Independent  Hand Dominance        Extremity/Trunk Assessment   Upper Extremity Assessment Upper Extremity Assessment: Overall WFL for tasks assessed    Lower Extremity Assessment Lower Extremity Assessment: Generalized weakness       Communication   Communication: No difficulties  Cognition Arousal/Alertness: Awake/alert Behavior During  Therapy: WFL for tasks assessed/performed Overall Cognitive Status: Impaired/Different from baseline Area of Impairment: Following commands;Safety/judgement       Following Commands: Follows one step commands consistently;Follows one step commands with increased time Safety/Judgement: Decreased awareness of safety;Decreased awareness of deficits          General Comments      Exercises     Assessment/Plan    PT Assessment Patient needs continued PT services  PT Problem List Decreased strength;Decreased activity tolerance;Decreased balance;Decreased mobility;Decreased coordination;Decreased cognition;Decreased knowledge of use of DME;Decreased safety awareness;Pain       PT Treatment Interventions DME instruction;Gait training;Stair training;Functional mobility training;Therapeutic activities;Therapeutic exercise;Balance training;Neuromuscular re-education;Patient/family education    PT Goals (Current goals can be found in the Care Plan section)  Acute Rehab PT Goals Patient Stated Goal: go to rehab to get stronger PT Goal Formulation: With patient Time For Goal Achievement: 05/11/16 Potential to Achieve Goals: Good    Frequency Min 3X/week   Barriers to discharge        Co-evaluation               End of Session Equipment Utilized During Treatment: Gait belt Activity Tolerance: Patient tolerated treatment well Patient left: in chair;with call bell/phone within reach;with chair alarm set Nurse Communication: Mobility status PT Visit Diagnosis: Unsteadiness on feet (R26.81);Other abnormalities of gait and mobility (R26.89);Pain Pain - part of body:  (abdomen)         Time: 9924-2683 PT Time Calculation (min) (ACUTE ONLY): 35 min   Charges:   PT Evaluation $PT Eval Moderate Complexity: 1 Procedure PT Treatments $Gait Training: 8-22 mins   PT G CodesClearnce Sorrel Elanie Hammitt 04/27/2016, 11:10 AM Sherie Don, PT, DPT [phone]

## 2016-04-27 NOTE — Progress Notes (Signed)
While patient ambulating to bedside commode epidural became disconnected. [facility] was called and will be coming up to remove. Dr. Brantley Stage is the doctor on call with [facility] and he was notified of issue. Dr. Brantley Stage reported that he thinks all she will need is her PCA at this point.

## 2016-04-28 LAB — GLUCOSE, CAPILLARY
GLUCOSE-CAPILLARY: 150 mg/dL — AB (ref 65–99)
GLUCOSE-CAPILLARY: 120 mg/dL — AB (ref 65–99)
Glucose-Capillary: 122 mg/dL — ABNORMAL HIGH (ref 65–99)
Glucose-Capillary: 136 mg/dL — ABNORMAL HIGH (ref 65–99)
Glucose-Capillary: 136 mg/dL — ABNORMAL HIGH (ref 65–99)
Glucose-Capillary: 139 mg/dL — ABNORMAL HIGH (ref 65–99)
Glucose-Capillary: 127 mg/dL — ABNORMAL HIGH (ref 65–99)

## 2016-04-28 LAB — CBC
HCT: 22.9 % — ABNORMAL LOW (ref 36.0–46.0)
Hemoglobin: 7.3 g/dL — ABNORMAL LOW (ref 12.0–15.0)
MCH: 28.5 pg (ref 26.0–34.0)
MCHC: 31.9 g/dL (ref 30.0–36.0)
MCV: 89.5 fL (ref 78.0–100.0)
PLATELETS: 249 10*3/uL (ref 150–400)
RBC: 2.56 MIL/uL — ABNORMAL LOW (ref 3.87–5.11)
RDW: 14.2 % (ref 11.5–15.5)
WBC: 6.3 10*3/uL (ref 4.0–10.5)

## 2016-04-28 LAB — COMPREHENSIVE METABOLIC PANEL
ALT: 21 U/L (ref 14–54)
ANION GAP: 7 (ref 5–15)
AST: 21 U/L (ref 15–41)
Albumin: 2.5 g/dL — ABNORMAL LOW (ref 3.5–5.0)
Alkaline Phosphatase: 28 U/L — ABNORMAL LOW (ref 38–126)
BUN: 16 mg/dL (ref 6–20)
CHLORIDE: 108 mmol/L (ref 101–111)
CO2: 24 mmol/L (ref 22–32)
CREATININE: 1.65 mg/dL — AB (ref 0.44–1.00)
Calcium: 8.8 mg/dL — ABNORMAL LOW (ref 8.9–10.3)
GFR calc Af Amer: 33 mL/min — ABNORMAL LOW (ref >60)
GFR, EST NON AFRICAN AMERICAN: 29 mL/min — AB (ref >60)
Glucose, Bld: 143 mg/dL — ABNORMAL HIGH (ref 65–99)
POTASSIUM: 3.4 mmol/L — AB (ref 3.5–5.1)
SODIUM: 139 mmol/L (ref 135–145)
Total Bilirubin: 0.6 mg/dL (ref 0.3–1.2)
Total Protein: 5.8 g/dL — ABNORMAL LOW (ref 6.5–8.1)

## 2016-04-28 MED ORDER — PANTOPRAZOLE SODIUM 40 MG IV SOLR
40.0000 mg | Freq: Two times a day (BID) | INTRAVENOUS | Status: DC
  Administered 2016-04-28 – 2016-04-29 (×2): 40 mg via INTRAVENOUS
  Filled 2016-04-28 (×2): qty 40

## 2016-04-28 NOTE — Clinical Social Work Placement (Signed)
   CLINICAL SOCIAL WORK PLACEMENT  NOTE  Date:  04/28/2016  Patient Details  Name: Kim Burns MRN: [id number] Date of Birth: 1935-04-07  Clinical Social Work is seeking post-discharge placement for this patient at the [place] level of care (*CSW will initial, date and re-position this form in  chart as items are completed):  Yes   Patient/family provided with [facility] Work Department's list of facilities offering this level of care within the geographic area requested by the patient (or if unable, by the patient's family).  Yes   Patient/family informed of their freedom to choose among providers that offer the needed level of care, that participate in Medicare, Medicaid or managed care program needed by the patient, have an available bed and are willing to accept the patient.  Yes   Patient/family informed of [facility]'s ownership interest in [facility] and [facility], as well as of the fact that they are under no obligation to receive care at these facilities.  PASRR submitted to EDS on 04/28/16     PASRR number received on 04/28/16     Existing PASRR number confirmed on       FL2 transmitted to all facilities in geographic area requested by pt/family on 04/28/16     FL2 transmitted to all facilities within larger geographic area on 04/28/16     Patient informed that his/her managed care company has contracts with or will negotiate with certain facilities, including the following:            Patient/family informed of bed offers received.  Patient chooses bed at       Physician recommends and patient chooses bed at      Patient to be transferred to   on  .  Patient to be transferred to facility by       Patient family notified on   of transfer.  Name of family member notified:        PHYSICIAN       Additional Comment:    _______________________________________________ Roanna Raider, LCSW 04/28/2016, 12:52  PM

## 2016-04-28 NOTE — Clinical Social Work Note (Signed)
Clinical Social Work Assessment  Patient Details  Name: Kim Burns MRN: [id number] Date of Birth: May 22, 1935  Date of referral:  04/28/16               Reason for consult:  Facility Placement                Permission sought to share information with:  Chartered certified accountant granted to share information::  No  Name::        Agency::     Relationship::     Contact Information:     Housing/Transportation Living arrangements for the past 2 months:  Single Family Home Source of Information:  Patient Patient Interpreter Needed:  None Criminal Activity/Legal Involvement Pertinent to Current Situation/Hospitalization:  No - Comment as needed Significant Relationships:  Adult Children, [place], Friend Lives with:  Self Do you feel safe going back to the place where you live?  No Need for family participation in patient care:  No (Coment)  Care giving concerns: Pt lives alone and has limited family/friends in area who can help her care for herself at d/c.   Social Worker assessment / plan:  CSW met with pt to discuss role of SW/d/c planning.  Pt lived alone and was independent with ADLs pta.  Pt is agreeable to SNF placement for rehab to help her return to her PLOF so that she can continue to live independently.  CSW will begin bed search in [place] per pt's wishes.  CSW will continue to follow and facilitate NH tx, as appropriate. Employment status:  Retired Nurse, adult PT Recommendations:    Information / Referral to community resources:     Patient/Family's Response to care:  Agreeable to the d/c plan and appreciative of CSW assistance with the placemen t process. Patient/Family's Understanding of and Emotional Response to Diagnosis, Current Treatment, and Prognosis: Solid.  Pt understands that she "now has diabetes" and will have to take insulin to regulate her blood sugars.  Pt understands that she will require diabetic teaching  and is hopeful to receive this care in the NH.  Pt also realizes that she is weak/deconditioned and will need therapy prior to returning home. Emotional Assessment Appearance:  Appears stated age Attitude/Demeanor/Rapport:   (pleasant, cooperative) Affect (typically observed):  Appropriate, Calm, Accepting Orientation:  Oriented to Self, Oriented to Place, Oriented to  Time, Oriented to Situation Alcohol / Substance use:  Not Applicable Psych involvement (Current and /or in the community):  No (Comment)  Discharge Needs  Concerns to be addressed:    Readmission within the last 30 days:    Current discharge risk:  None Barriers to Discharge:  No Barriers Identified   Creig Landin M, LCSW 04/28/2016, 1:14 PM

## 2016-04-28 NOTE — NC FL2 (Signed)
[place] LEVEL OF CARE SCREENING TOOL     IDENTIFICATION  Patient Name: Kim Burns Birthdate: Jun 30, 1935 Sex: female Admission Date (Current Location): 04/23/2016  [facility] and [place] Number:  Herbalist and Address:  [facility] [place]. [facility] [address], [place],  [address]      Provider Number: [id number]  Attending Physician Name and Address:  Stark Klein, MD  Relative Name and Phone Number:       Current Level of Care: Hospital Recommended Level of Care: [place] Prior Approval Number:    Date Approved/Denied:   PASRR Number:  [id number])  Discharge Plan: SNF    Current Diagnoses: Patient Active Problem List   Diagnosis Date Noted  . IPMN (intraductal papillary mucinous neoplasm) 04/23/2016  . Sepsis ([place]) 06/18/2013  . Localized swelling, mass and lump, neck 03/29/2013  . Sinus bradycardia 09/18/2012  . Fatigue 09/18/2012  . CKD (chronic kidney disease) stage 3, GFR 30-59 ml/min 09/18/2012    Orientation RESPIRATION BLADDER Height & Weight     Self, Time, Situation, Place  Normal Continent Weight: 152 lb 1.9 oz (69 kg) Height:  5\' 4"  (162.6 cm)  BEHAVIORAL SYMPTOMS/MOOD NEUROLOGICAL BOWEL NUTRITION STATUS      Continent  (carb modified)  AMBULATORY STATUS COMMUNICATION OF NEEDS Skin   Limited Assist Verbally Surgical wounds                       Personal Care Assistance Level of Assistance  Bathing, Feeding, Dressing Bathing Assistance: Limited assistance Feeding assistance: Independent Dressing Assistance: Limited assistance     Functional Limitations Info  Sight, Hearing, Speech Sight Info: Adequate Hearing Info: Adequate Speech Info: Adequate    SPECIAL CARE FACTORS FREQUENCY  OT (By licensed OT)     PT Frequency:  (5x/week) OT Frequency:  (5x/week)            Contractures Contractures Info: Not present    Additional Factors Info  Insulin Sliding Scale,  Allergies (every 4 hours) Code Status Info:  (full) Allergies Info:  (codeine, doxycycline, gemifbrozil, hydrocodone-acetaminophen, lotemax, statins)   Insulin Sliding Scale Info:  (every four hours)       Current Medications (04/28/2016):  This is the current hospital active medication list Current Facility-Administered Medications  Medication Dose Route Frequency Provider Last Rate Last Dose  . 0.9 %  sodium chloride infusion   Intravenous Continuous Lynda Rainwater, MD   Stopped at 04/23/16 1502  . bupivacaine (SENSORCAINE-MPF) 0.125 % in sodium chloride 0.9 % 150 mL epidural   Epidural Continuous Myrtie Soman, MD   Stopped at 04/27/16 1504  . calcitRIOL (ROCALTROL) capsule 0.25 mcg  0.25 mcg Oral Q M,W,F Stark Klein, MD      . chlorhexidine (PERIDEX) 0.12 % solution 15 mL  15 mL Mouth Rinse BID Stark Klein, MD   15 mL at 04/28/16 0828  . cinacalcet (SENSIPAR) tablet 30 mg  30 mg Oral Q supper Stark Klein, MD   30 mg at 04/27/16 1703  . dextrose 5 %-0.45 % sodium chloride infusion   Intravenous Continuous Stark Klein, MD 100 mL/hr at 04/28/16 7948    . diclofenac sodium (VOLTAREN) 1 % transdermal gel 2 g  2 g Topical QID PRN Stark Klein, MD      . diphenhydrAMINE (BENADRYL) injection 12.5 mg  12.5 mg Intravenous Q6H PRN Stark Klein, MD   12.5 mg at 04/25/16 2206   Or  .  diphenhydrAMINE (BENADRYL) 12.5 MG/5ML elixir 12.5 mg  12.5 mg Oral Q6H PRN Stark Klein, MD      . escitalopram (LEXAPRO) tablet 5 mg  5 mg Oral QHS Stark Klein, MD   5 mg at 04/27/16 2134  . febuxostat (ULORIC) tablet 80 mg  80 mg Oral q morning - 10a Stark Klein, MD   80 mg at 04/28/16 0828  . hydrALAZINE (APRESOLINE) tablet 25 mg  25 mg Oral TID Stark Klein, MD   25 mg at 04/28/16 3546  . HYDROmorphone (DILAUDID) 1 mg/mL PCA injection   Intravenous Q4H Stark Klein, MD      . HYDROmorphone (DILAUDID) injection 0.5-1 mg  0.5-1 mg Intravenous Q4H PRN Stark Klein, MD   1 mg at 04/24/16 1058  . insulin aspart  (novoLOG) injection 0-9 Units  0-9 Units Subcutaneous Q4H Stark Klein, MD   1 Units at 04/28/16 1244  . insulin glargine (LANTUS) injection 4 Units  4 Units Subcutaneous Daily Stark Klein, MD   4 Units at 04/28/16 0829  . MEDLINE mouth rinse  15 mL Mouth Rinse q12n4p Stark Klein, MD   15 mL at 04/27/16 1159  . naloxone (NARCAN) injection 0.4 mg  0.4 mg Intravenous PRN Stark Klein, MD       And  . sodium chloride flush (NS) 0.9 % injection 9 mL  9 mL Intravenous PRN Stark Klein, MD      . ondansetron (ZOFRAN) injection 4 mg  4 mg Intravenous Q6H PRN Stark Klein, MD   4 mg at 04/27/16 0449  . ondansetron (ZOFRAN-ODT) disintegrating tablet 4 mg  4 mg Oral Q6H PRN Stark Klein, MD       Or  . ondansetron (ZOFRAN) injection 4 mg  4 mg Intravenous Q6H PRN Stark Klein, MD   4 mg at 04/26/16 1718  . pantoprazole (PROTONIX) injection 40 mg  40 mg Intravenous Q12H Earnstine Regal, PA-C      . polyvinyl alcohol (LIQUIFILM TEARS) 1.4 % ophthalmic solution 1 drop  1 drop Both Eyes TID PRN Stark Klein, MD   1 drop at 04/24/16 1003     Discharge Medications: Please see discharge summary for a list of discharge medications.  Relevant Imaging Results:  Relevant Lab Results:   Additional Information    Caridad Silveira M, LCSW

## 2016-04-28 NOTE — Progress Notes (Signed)
5 Days Post-Op  Subjective: Nauseated Friday and reflux currently ongoing.  Not taking much PO.  Hurts to move or get up.  Drains serosanguinous fluid, incision ok.    Objective: Vital signs in last 24 hours: Temp:  [97.8 F (36.6 C)-99 F (37.2 C)] 99 F (37.2 C) (03/11 0911) Pulse Rate:  [76-88] 76 (03/11 0911) Resp:  [12-21] 16 (03/11 0911) BP: (138-162)/(75-91) 139/75 (03/11 0911) SpO2:  [93 %-100 %] 96 % (03/11 0911) Last BM Date:  (PTA)  11220 IV 2100 urine Drain 105 Afebrile, VSS K+ 3.4 creatinine is stable 1.65  WBC stable  H/H stable.     Intake/Output from previous day: 03/10 0701 - 03/11 0700 In: 1220 [I.V.:1100] Out: 2205 [Urine:2100; Drains:105] Intake/Output this shift: No intake/output data recorded.  General appearance: alert, cooperative and no distress Resp: clear to auscultation bilaterally GI: soft, tender having allot of pain with movement.  On PCA.  Not much flatus, allot of GERD like sx.    Lab Results:   Recent Labs  04/27/16 0220 04/28/16 0421  WBC 8.0 6.3  HGB 7.3* 7.3*  HCT 22.6* 22.9*  PLT 239 249    BMET  Recent Labs  04/27/16 0220 04/28/16 0421  NA 136 139  K 3.5 3.4*  CL 106 108  CO2 22 24  GLUCOSE 148* 143*  BUN 22* 16  CREATININE 1.75* 1.65*  CALCIUM 8.8* 8.8*   PT/INR No results for input(s): LABPROT, INR in the last 72 hours.   Recent Labs Lab 04/24/16 0359 04/25/16 0405 04/26/16 0425 04/27/16 0220 04/28/16 0421  AST 211* 63* 36 26 21  ALT 94* 36 26 22 21   ALKPHOS 15* 18* 18* 23* 28*  BILITOT 0.5 0.4 0.4 0.5 0.6  PROT 5.6* 5.3* 5.0* 5.4* 5.8*  ALBUMIN 3.4* 3.0* 2.7* 2.7* 2.5*     Lipase     Component Value Date/Time   LIPASE 35 06/18/2013 1435     Studies/Results: No results found. Prior to Admission medications   Medication Sig Start Date End Date Taking? Authorizing Provider  Artificial Tear Solution (GENTEAL TEARS OP) Apply 1 drop to eye 3 (three) times daily as needed (dry eyes).    Yes  Historical Provider, MD  calcitRIOL (ROCALTROL) 0.25 MCG capsule Take 0.25 mcg by mouth every Monday, Wednesday, and Friday. At night   Yes Historical Provider, MD  cinacalcet (SENSIPAR) 30 MG tablet Take 30 mg by mouth daily with supper.   Yes Historical Provider, MD  clopidogrel (PLAVIX) 75 MG tablet Take 75 mg by mouth every morning.   Yes Historical Provider, MD  diclofenac sodium (VOLTAREN) 1 % GEL Apply 2 g topically 4 (four) times daily as needed (hand pain).    Yes Historical Provider, MD  diphenhydramine-acetaminophen (TYLENOL PM) 25-500 MG TABS Take 2 tablets by mouth at bedtime as needed (Sleep).    Yes Historical Provider, MD  escitalopram (LEXAPRO) 5 MG tablet Take 5 mg by mouth at bedtime.   Yes Historical Provider, MD  Febuxostat (ULORIC) 80 MG TABS Take 80 mg by mouth every morning.    Yes Historical Provider, MD  fenofibrate (TRICOR) 145 MG tablet Take 145 mg by mouth every morning.    Yes Historical Provider, MD  hydrALAZINE (APRESOLINE) 25 MG tablet Take 25 mg by mouth 3 (three) times daily.   Yes Historical Provider, MD  Omega-3 Fatty Acids (FISH OIL) 1200 MG CAPS Take 2,400 mg by mouth daily.   Yes Historical Provider, MD  telmisartan (MICARDIS) 80  MG tablet Take 80 mg by mouth at bedtime.    Yes Historical Provider, MD  Vitamin D, Ergocalciferol, (DRISDOL) 50000 UNITS CAPS capsule Take 50,000 Units by mouth every 7 (seven) days. Wednesdays   Yes Historical Provider, MD  acetaminophen (TYLENOL) 500 MG tablet Take 1,000 mg by mouth every 6 (six) hours as needed for moderate pain.    Historical Provider, MD  azelastine (ASTELIN) 0.1 % nasal spray Place 1 spray into both nostrils at bedtime as needed for rhinitis. Use in each nostril as directed    Historical Provider, MD  clobetasol (TEMOVATE) 0.05 % GEL Apply 1 application topically daily as needed (itching).    Historical Provider, MD    Medications: . calcitRIOL  0.25 mcg Oral Q M,W,F  . chlorhexidine  15 mL Mouth Rinse BID   . cinacalcet  30 mg Oral Q supper  . escitalopram  5 mg Oral QHS  . febuxostat  80 mg Oral q morning - 10a  . hydrALAZINE  25 mg Oral TID  . HYDROmorphone   Intravenous Q4H  . insulin aspart  0-9 Units Subcutaneous Q4H  . insulin glargine  4 Units Subcutaneous Daily  . mouth rinse  15 mL Mouth Rinse q12n4p  . pantoprazole (PROTONIX) IV  40 mg Intravenous QHS   . sodium chloride Stopped (04/23/16 1502)  . bupivacaine (MARCAINE, SENSORCAINE) epidural Stopped (04/27/16 1504)  . dextrose 5 % and 0.45% NaCl 100 mL/hr at 04/28/16 0623     1. Whipple procedure/resection, w/ Omentum - INTRADUCTAL PAPILLARY MUCINOUS NEOPLASM WITH FOCI OF HIGH GRADE DYSPLASIA. - NO INVASIVE CARCINOMA. - EIGHT BENIGN LYMPH NODE (0/8). - INCIDENTAL BENIGN DUODENAL LYMPHANGIOMA. 2. Pancreas, biopsy, Additional pancreatic margin - PANCREATIC DUCT INVOLVED BY INTRADUCTAL PAPILLARY MUCINOUS NEOPLASM. - NO INVASIVE CARCINOMA. 3. Lymph node, biopsy, Portal - TWO BENIGN LYMPH NODES (0/2).  Assessment/Plan IPMN pancreatic head S/p Classic pancreaticoduodenectomy , Placement of pancreatic stent, Repair of umbilical hernia as part of closure 04/23/16, Dr. Stark Klein Hypertension Chronic kidney disease Sleep apnea Hx of TIA Hx of anemia FEN:  IV fluids/clear liquids ID: Pre op antibiotics only DVT:  SCD    Plan:  Continue clears, increase PPI, OOB more.  OT and PT requested  LOS: 5 days    Kim Burns 04/28/2016 [phone]

## 2016-04-28 NOTE — Progress Notes (Signed)
Occupational Therapy Evaluation Patient Details Name: BERNETTA SUTLEY MRN: [id number] DOB: May 15, 1935 Today's Date: 04/28/2016    History of Present Illness Pt is a 81 y/o female s/p whipple procedure secondary to pancreatic mass. 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 Pt currently reqruires mod A with mobility and ADL.VSS during session. Pt will benefit from rehab at SNF to facilitate return to PLOF. Will follow acutely to address established goals and facilitate DC to next venue of care.     Follow Up Recommendations  SNF;Supervision/Assistance - 24 hour    Equipment Recommendations  3 in 1 bedside commode    Recommendations for Other Services       Precautions / Restrictions Precautions Precautions: Fall Precaution Comments: abdominal drains x 2 Restrictions Weight Bearing Restrictions: No      Mobility Bed Mobility               General bed mobility comments: OOB on BSC  Transfers Overall transfer level: Needs assistance Equipment used: 1 person hand held assist Transfers: Sit to/from Stand Sit to Stand: Min assist         General transfer comment: Mod A with mobility without RW. Appears to have a posterior bias    Balance     Sitting balance-Leahy Scale: Fair       Standing balance-Leahy Scale: Poor                              ADL Overall ADL's : Needs assistance/impaired     Grooming: Set up;Sitting   Upper Body Bathing: Set up;Sitting   Lower Body Bathing: Moderate assistance;Sit to/from stand   Upper Body Dressing : Minimal assistance;Cueing for UE precautions   Lower Body Dressing: Maximal assistance;Sit to/from stand   Toilet Transfer: Moderate assistance;Stand-pivot;BSC   Toileting- Clothing Manipulation and Hygiene: Minimal assistance       Functional  mobility during ADLs: Minimal assistance;Cueing for safety (pushing IV pole) General ADL Comments: limited by pain and poor endurnace     Vision         Perception     Praxis      Pertinent Vitals/Pain Pain Assessment: 0-10 Pain Score: 5  Pain Location: abdomen Pain Descriptors / Indicators: Discomfort;Sore Pain Intervention(s): Limited activity within patient's tolerance     Hand Dominance Right   Extremity/Trunk Assessment Upper Extremity Assessment Upper Extremity Assessment: Generalized weakness   Lower Extremity Assessment Lower Extremity Assessment: Generalized weakness   Cervical / Trunk Assessment Cervical / Trunk Assessment: Normal   Communication Communication Communication: No difficulties   Cognition Arousal/Alertness: Awake/alert Behavior During Therapy: WFL for tasks assessed/performed Overall Cognitive Status: Within Functional Limits for tasks assessed                     General Comments       Exercises       Shoulder Instructions      Home Living Family/patient expects to be discharged to:: Skilled nursing facility Living Arrangements: Alone                                      Prior Functioning/Environment Level of Independence: Independent  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        OT Problem List: Decreased strength;Decreased range of motion;Decreased activity tolerance;Impaired balance (sitting and/or standing);Decreased safety awareness;Decreased knowledge of use of DME or AE;Pain      OT Treatment/Interventions: Self-care/ADL training;Therapeutic exercise;Energy conservation;DME and/or AE instruction;Therapeutic activities;Patient/family education;Balance training    OT Goals(Current goals can be found in the care plan section) Acute Rehab OT Goals Patient Stated Goal: go to rehab to get stronger OT Goal Formulation: With patient Time For Goal Achievement:  05/12/16 Potential to Achieve Goals: Good ADL Goals Pt Will Perform Upper Body Bathing: with set-up;sitting Pt Will Perform Lower Body Bathing: with set-up;with supervision;with adaptive equipment;sit to/from stand Pt Will Transfer to Toilet: with supervision;ambulating;bedside commode Pt Will Perform Toileting - Clothing Manipulation and hygiene: with supervision;sit to/from stand;sitting/lateral leans  OT Frequency: Min 2X/week   Barriers to D/C:            Co-evaluation              End of Session Nurse Communication: Mobility status  Activity Tolerance: Patient tolerated treatment well Patient left: in chair;with call bell/phone within reach;with chair alarm set  OT Visit Diagnosis: Unsteadiness on feet (R26.81);Muscle weakness (generalized) (M62.81);Pain Pain - part of body:  (abdomen)                ADL either performed or assessed with clinical judgement  Time: 1238-1301 OT Time Calculation (min): 23 min Charges:  OT General Charges $OT Visit: 1 Procedure OT Evaluation $OT Eval High Complexity: 1 Procedure OT Treatments $Self Care/Home Management : 8-22 mins G-Codes:     [facility], OT/L  [phone] 04/28/2016  Ragnar Waas,HILLARY 04/28/2016, 1:15 PM

## 2016-04-29 DIAGNOSIS — K219 Gastro-esophageal reflux disease without esophagitis: Secondary | ICD-10-CM

## 2016-04-29 DIAGNOSIS — K210 Gastro-esophageal reflux disease with esophagitis: Secondary | ICD-10-CM

## 2016-04-29 DIAGNOSIS — N183 Chronic kidney disease, stage 3 (moderate): Secondary | ICD-10-CM

## 2016-04-29 DIAGNOSIS — D490 Neoplasm of unspecified behavior of digestive system: Secondary | ICD-10-CM

## 2016-04-29 DIAGNOSIS — R739 Hyperglycemia, unspecified: Secondary | ICD-10-CM

## 2016-04-29 LAB — GLUCOSE, CAPILLARY
GLUCOSE-CAPILLARY: 127 mg/dL — AB (ref 65–99)
GLUCOSE-CAPILLARY: 136 mg/dL — AB (ref 65–99)
Glucose-Capillary: 153 mg/dL — ABNORMAL HIGH (ref 65–99)
Glucose-Capillary: 114 mg/dL — ABNORMAL HIGH (ref 65–99)
Glucose-Capillary: 146 mg/dL — ABNORMAL HIGH (ref 65–99)
Glucose-Capillary: 102 mg/dL — ABNORMAL HIGH (ref 65–99)

## 2016-04-29 MED ORDER — GI COCKTAIL ~~LOC~~
30.0000 mL | Freq: Three times a day (TID) | ORAL | Status: DC | PRN
  Filled 2016-04-29: qty 30

## 2016-04-29 MED ORDER — BIOTENE DRY MOUTH MT LIQD: 15.0000 mL | Freq: Two times a day (BID) | OROMUCOSAL | Status: DC | PRN

## 2016-04-29 MED ORDER — PANTOPRAZOLE SODIUM 40 MG PO TBEC
40.0000 mg | DELAYED_RELEASE_TABLET | Freq: Two times a day (BID) | ORAL | Status: DC
  Administered 2016-04-29 – 2016-05-06 (×14): 40 mg via ORAL
  Filled 2016-04-29 (×14): qty 1

## 2016-04-29 MED ORDER — GLYBURIDE 1.25 MG PO TABS
1.2500 mg | ORAL_TABLET | Freq: Every day | ORAL | Status: DC
  Administered 2016-04-29 – 2016-04-30 (×2): 1.25 mg via ORAL
  Filled 2016-04-29 (×2): qty 1

## 2016-04-29 MED ORDER — FAMOTIDINE 20 MG PO TABS
20.0000 mg | ORAL_TABLET | Freq: Every day | ORAL | Status: DC
  Administered 2016-04-30 – 2016-05-06 (×6): 20 mg via ORAL
  Filled 2016-04-29 (×7): qty 1

## 2016-04-29 MED ORDER — FAMOTIDINE IN NACL 20-0.9 MG/50ML-% IV SOLN
20.0000 mg | INTRAVENOUS | Status: DC
  Administered 2016-04-29: 20 mg via INTRAVENOUS
  Filled 2016-04-29: qty 50

## 2016-04-29 NOTE — Care Management Note (Signed)
Case Management Note  Patient Details  Name: Kim Burns MRN: [id number] Date of Birth: Jul 24, 1935  Subjective/Objective:      Pod 1 of whipple, patient lives at home alone, NCM spoke with patient's son Chenelle Benning  [phone], he states the plan is for her to get rehab when she is ready for discharge, he was not sure of what type (snf vs cir) patient will need a pt/ot eval when can tolerate.  She has a daughter , Milana Obey also [phone]. She has d5, dilaudid pca, ng tube, conts to be npo. Pain is her issue right now.  NCM will cont to follow for dc needs.      3/12 [place]. BSN- POD 6 Whipple, pancreatoduodenectomy , pancreatic stent, repair of umbilical hernia,  Advance diet slowly  To full, cont pca for one more day. Plan is for SNF at discharge.                          Action/Plan:   Expected Discharge Date:                  Expected Discharge Plan:  Skilled Nursing Facility  In-House Referral:  Clinical Social Work  Discharge planning Services  CM Consult  Post Acute Care Choice:    Choice offered to:     DME Arranged:    DME Agency:     HH Arranged:    [place] Agency:     Status of Service:  In process, will continue to follow  If discussed at Long Length of Stay Meetings, dates discussed:    Additional Comments:  Zenon Mayo, RN 04/29/2016, 9:13 AM

## 2016-04-29 NOTE — Progress Notes (Signed)
Rehab admissions - Patient up in hall with therapy this am.  Patient has [place] and it is unlikely that we would be able to get approval for acute inpatient rehab admission.  In addition, patient lives alone.  I will talk with patient about rehab venues today.  Currently rehab beds are full.  Call me for questions.  #[phone]

## 2016-04-29 NOTE — Progress Notes (Signed)
Nutrition Consult--Diet Education  RD consulted for nutrition education regarding diet s/p Whipple procedure with hyperglycemia.   Patient has had diet education with an RD at [facility]. Encouraged her to follow-up there after d/c.  RD provided "Carbohydrate Counting for People with Diabetes" handout from the Academy of Nutrition and Dietetics. Discussed sources of CHO and the importance of spreading out CHO foods throughout the day. Provided list of carbohydrates and recommended serving sizes of common foods. Briefly discussed protein needs and slow diet advancement.  Teach back method used. Expect good compliance.  Body mass index is 26.11 kg/m. Pt meets criteria for overweight based on current BMI.  Current diet order is full liquids, diet is being advanced slowly. Labs and medications reviewed. No further nutrition interventions warranted at this time. RD contact information provided. If additional nutrition issues arise, please re-consult RD.  Molli Barrows, RD, LDN, [place] Pager [phone] After Hours Pager [phone]

## 2016-04-29 NOTE — Progress Notes (Signed)
CSW provided SNF list. [facility] is patient's first choice.   Kim Burns LCSWA [phone]

## 2016-04-29 NOTE — Progress Notes (Signed)
6 Days Post-Op  Subjective:  Epidural out.  Complaining of headache.  Feels less sharp mentally right now.  Is having episodes of reflux at night.    Objective: Vital signs in last 24 hours: Temp:  [97.6 F (36.4 C)-99 F (37.2 C)] 98.4 F (36.9 C) (03/12 0739) Pulse Rate:  [69-78] 69 (03/12 0739) Resp:  [12-18] 13 (03/12 0739) BP: (138-165)/(68-80) 145/68 (03/12 0739) SpO2:  [96 %-100 %] 99 % (03/12 0739) Last BM Date:  (PTA)  Intake/Output from previous day: 03/11 0701 - 03/12 0700 In: 2070 [I.V.:1870] Out: 950 [Urine:950] Intake/Output this shift: No intake/output data recorded.  General appearance: alert, cooperative and no distress Resp: breathing comfortably GI: soft, mildly distended.  approp tender.  Drains serosang.    Lab Results:   Recent Labs  04/27/16 0220 04/28/16 0421  WBC 8.0 6.3  HGB 7.3* 7.3*  HCT 22.6* 22.9*  PLT 239 249    BMET  Recent Labs  04/27/16 0220 04/28/16 0421  NA 136 139  K 3.5 3.4*  CL 106 108  CO2 22 24  GLUCOSE 148* 143*  BUN 22* 16  CREATININE 1.75* 1.65*  CALCIUM 8.8* 8.8*   PT/INR No results for input(s): LABPROT, INR in the last 72 hours.   Recent Labs Lab 04/24/16 0359 04/25/16 0405 04/26/16 0425 04/27/16 0220 04/28/16 0421  AST 211* 63* 36 26 21  ALT 94* 36 26 22 21   ALKPHOS 15* 18* 18* 23* 28*  BILITOT 0.5 0.4 0.4 0.5 0.6  PROT 5.6* 5.3* 5.0* 5.4* 5.8*  ALBUMIN 3.4* 3.0* 2.7* 2.7* 2.5*     Lipase     Component Value Date/Time   LIPASE 35 06/18/2013 1435     Studies/Results: No results found. Prior to Admission medications   Medication Sig Start Date End Date Taking? Authorizing Provider  Artificial Tear Solution (GENTEAL TEARS OP) Apply 1 drop to eye 3 (three) times daily as needed (dry eyes).    Yes Historical Provider, MD  calcitRIOL (ROCALTROL) 0.25 MCG capsule Take 0.25 mcg by mouth every Monday, Wednesday, and Friday. At night   Yes Historical Provider, MD  cinacalcet (SENSIPAR) 30 MG  tablet Take 30 mg by mouth daily with supper.   Yes Historical Provider, MD  clopidogrel (PLAVIX) 75 MG tablet Take 75 mg by mouth every morning.   Yes Historical Provider, MD  diclofenac sodium (VOLTAREN) 1 % GEL Apply 2 g topically 4 (four) times daily as needed (hand pain).    Yes Historical Provider, MD  diphenhydramine-acetaminophen (TYLENOL PM) 25-500 MG TABS Take 2 tablets by mouth at bedtime as needed (Sleep).    Yes Historical Provider, MD  escitalopram (LEXAPRO) 5 MG tablet Take 5 mg by mouth at bedtime.   Yes Historical Provider, MD  Febuxostat (ULORIC) 80 MG TABS Take 80 mg by mouth every morning.    Yes Historical Provider, MD  fenofibrate (TRICOR) 145 MG tablet Take 145 mg by mouth every morning.    Yes Historical Provider, MD  hydrALAZINE (APRESOLINE) 25 MG tablet Take 25 mg by mouth 3 (three) times daily.   Yes Historical Provider, MD  Omega-3 Fatty Acids (FISH OIL) 1200 MG CAPS Take 2,400 mg by mouth daily.   Yes Historical Provider, MD  telmisartan (MICARDIS) 80 MG tablet Take 80 mg by mouth at bedtime.    Yes Historical Provider, MD  Vitamin D, Ergocalciferol, (DRISDOL) 50000 UNITS CAPS capsule Take 50,000 Units by mouth every 7 (seven) days. Wednesdays   Yes Historical Provider,  MD  acetaminophen (TYLENOL) 500 MG tablet Take 1,000 mg by mouth every 6 (six) hours as needed for moderate pain.    Historical Provider, MD  azelastine (ASTELIN) 0.1 % nasal spray Place 1 spray into both nostrils at bedtime as needed for rhinitis. Use in each nostril as directed    Historical Provider, MD  clobetasol (TEMOVATE) 0.05 % GEL Apply 1 application topically daily as needed (itching).    Historical Provider, MD    Medications: . calcitRIOL  0.25 mcg Oral Q M,W,F  . chlorhexidine  15 mL Mouth Rinse BID  . cinacalcet  30 mg Oral Q supper  . escitalopram  5 mg Oral QHS  . febuxostat  80 mg Oral q morning - 10a  . hydrALAZINE  25 mg Oral TID  . HYDROmorphone   Intravenous Q4H  . insulin  aspart  0-9 Units Subcutaneous Q4H  . insulin glargine  4 Units Subcutaneous Daily  . mouth rinse  15 mL Mouth Rinse q12n4p  . pantoprazole (PROTONIX) IV  40 mg Intravenous Q12H   . sodium chloride Stopped (04/23/16 1502)  . bupivacaine (MARCAINE, SENSORCAINE) epidural Stopped (04/27/16 1504)  . dextrose 5 % and 0.45% NaCl 100 mL/hr at 04/29/16 0042     1. Whipple procedure/resection, w/ Omentum - INTRADUCTAL PAPILLARY MUCINOUS NEOPLASM WITH FOCI OF HIGH GRADE DYSPLASIA. - NO INVASIVE CARCINOMA. - EIGHT BENIGN LYMPH NODE (0/8). - INCIDENTAL BENIGN DUODENAL LYMPHANGIOMA. 2. Pancreas, biopsy, Additional pancreatic margin - PANCREATIC DUCT INVOLVED BY INTRADUCTAL PAPILLARY MUCINOUS NEOPLASM. - NO INVASIVE CARCINOMA. 3. Lymph node, biopsy, Portal - TWO BENIGN LYMPH NODES (0/2).  Assessment/Plan IPMN pancreatic head S/p Classic pancreaticoduodenectomy , Placement of pancreatic stent, Repair of umbilical hernia as part of closure 04/23/16 Hypertension Chronic kidney disease Sleep apnea Hx of TIA ABL anemia FEN:  IV fluids/clear liquids Hyperglycemia - consult medicine.   DVT:  SCD   Plan:  SW working on SNF placement.  Slow diet advance.  Nutrition consult.   Medicine consult to assess need for insulin vs other hyperglycemic.   1 more day on PCA Full liquids.     LOS: 6 days    [facility] 04/29/2016

## 2016-04-29 NOTE — Progress Notes (Signed)
Physical Therapy Treatment Patient Details Name: Kim Burns MRN: [id number] DOB: 1936/01/21 Today's Date: 04/29/2016    History of Present Illness Pt is a 81 y/o female s/p whipple procedure secondary to pancreatic mass. PMH including but not limited to CKD, HTN and hx of CVA    PT Comments    Pt continues to experience dizziness when up, unsteady on feet, vision contributing to this in part. Ambulated 66' x2 with RW and min A. Chair brought behind pt. LOB when practicing backwards walking. Min A to correct. PT will continue to follow.    Follow Up Recommendations  SNF;Supervision/Assistance - 24 hour     Equipment Recommendations  Rolling walker with 5" wheels    Recommendations for Other Services       Precautions / Restrictions Precautions Precautions: Fall Precaution Comments: abdominal drains x 2 Restrictions Weight Bearing Restrictions: No    Mobility  Bed Mobility               General bed mobility comments: pt received in recliner  Transfers Overall transfer level: Needs assistance Equipment used: Rolling walker (2 wheeled) Transfers: Sit to/from Stand Sit to Stand: Min assist         General transfer comment: min A to stand from recliner, min A and vc's for safe sitting back to recliner  Ambulation/Gait Ambulation/Gait assistance: Min assist Ambulation Distance (Feet): 50 Feet (2x) Assistive device: Rolling walker (2 wheeled) Gait Pattern/deviations: Step-through pattern;Decreased stride length;Shuffle Gait velocity: decreased Gait velocity interpretation: <1.8 ft/sec, indicative of risk for recurrent falls General Gait Details: pt with very slow gait and continuance of dizziness, min A to guide RW at times. Pt required seated rest break at 50' due to fatigue and dizziness. Pt very pale with initial sitting but improved after 3 mins and was able to continue ambulation.    Stairs            Wheelchair Mobility    Modified Rankin  (Stroke Patients Only)       Balance Overall balance assessment: Needs assistance Sitting-balance support: Feet supported Sitting balance-Leahy Scale: Fair     Standing balance support: During functional activity;Bilateral upper extremity supported Standing balance-Leahy Scale: Poor Standing balance comment: practiced bkwd ambulation with RW and pt with LOB posterior. Min A to correct. Relies on UE support                    Cognition Arousal/Alertness: Awake/alert Behavior During Therapy: WFL for tasks assessed/performed Overall Cognitive Status: Within Functional Limits for tasks assessed                      Exercises      General Comments General comments (skin integrity, edema, etc.): VSS      Pertinent Vitals/Pain Pain Assessment: 0-10 Pain Score: 5  Pain Location: headache Pain Descriptors / Indicators: Constant Pain Intervention(s): Limited activity within patient's tolerance;Monitored during session;PCA encouraged    Home Living                      Prior Function            PT Goals (current goals can now be found in the care plan section) Acute Rehab PT Goals Patient Stated Goal: go to rehab to get stronger PT Goal Formulation: With patient Time For Goal Achievement: 05/11/16 Potential to Achieve Goals: Good Progress towards PT goals: Progressing toward goals    Frequency    Min  3X/week      PT Plan Current plan remains appropriate    Co-evaluation             End of Session Equipment Utilized During Treatment: Gait belt Activity Tolerance: Patient limited by fatigue Patient left: in chair;with call bell/phone within reach Nurse Communication: Mobility status PT Visit Diagnosis: Unsteadiness on feet (R26.81);Other abnormalities of gait and mobility (R26.89);Pain;Dizziness and giddiness (R42) Pain - part of body:  (head)     Time: 5146-0479 PT Time Calculation (min) (ACUTE ONLY): 28 min  Charges:  $Gait  Training: 23-37 mins                    G Codes:     [place]  C[place] 04/29/2016, 11:10 AM

## 2016-04-29 NOTE — Care Management Important Message (Signed)
Important Message  Patient Details  Name: Kim Burns MRN: [id number] Date of Birth: 19-Nov-1935   Medicare Important Message Given:  Yes    Mat Stuard Abena 04/29/2016, 1:46 PM

## 2016-04-29 NOTE — Plan of Care (Signed)
Problem: Safety: Goal: Ability to remain free from injury will improve Outcome: Progressing Rings call bell appropriately for bathroom assistance.   Problem: Health Behavior/Discharge Planning: Goal: Ability to manage health-related needs will improve Outcome: Progressing SW consulted and following patient.   Problem: Activity: Goal: Risk for activity intolerance will decrease Outcome: Progressing Ambulated in the hall with PT 04/29/16.  Problem: Nutrition: Goal: Adequate nutrition will be maintained Outcome: Progressing Progressed to full liquids 04/29/16.  Problem: Bowel/Gastric: Goal: Will not experience complications related to bowel motility Outcome: Progressing Has not has a BM since surgery, but is passing gas and has good bowel sounds.

## 2016-04-29 NOTE — Consult Note (Signed)
Medical Consultation   Kim Burns  EYC:[id number]  DOB: 1935-09-10  DOA: 04/23/2016  PCP: Rachell Cipro, MD   Outpatient Specialists:  General surgery Neurology Nephrology  Requesting physician: Dr Barry Dienes - general surgery  Reason for consultation: DM management  History of Present Illness: Kim Burns is an 81 y.o. female who presented to [facility] on 04/26/2016 for elective Whipple procedure. Performed secondary to mass in the head of the pancreas. Patient reports doing fairly well since surgery with typical complaints of abdominal discomfort and slow return of appetite. Patient also complaining of significant throat pain that is worse with lying flat and often times after meals. History of similar symptoms though significantly worse since surgery. Patient denies any previous history of hyperglycemia or diabetes. Currently denies any fevers, chest pain, cough, shortness of breath, palpitations, nausea, vomiting, diarrhea or dysuria  Review of Systems:  ROS As per HPI otherwise 10 point review of systems negative.    Past Medical History: Past Medical History:  Diagnosis Date  . Anemia of chronic disease 1985   hysterectomy fibroid tumors  . Bradycardia   . CKD (chronic kidney disease) stage 3, GFR 30-59 ml/min    sees dr Arty Baumgartner sees next in jan 2018  . Dysrhythmia 04/16/2016   bradycardia due to medication   . GERD (gastroesophageal reflux disease)   . Gout   . Headache   . History of kidney stones 06/2013  . Hyperparathyroidism ([place])   . Hypertension   . Itching    last year  . PONV (postoperative nausea and vomiting)   . Sleep apnea    no cpap machine. could not tolerate  . Stroke ([place]) 04/16/2016   TIA 1995  . Thyroid disease   . TIA (transient ischemic attack) 1996   x 1  . Vitamin D deficiency     Past Surgical History: Past Surgical History:  Procedure Laterality Date  . ABDOMINAL HYSTERECTOMY     complete  .  APPENDECTOMY     with gallbladder  . BACK SURGERY     lower  . BREAST LUMPECTOMY Left x 2   many years apart, benign  . CHOLECYSTECTOMY    . CYSTOSCOPY WITH URETEROSCOPY AND STENT PLACEMENT Left 06/18/2013   Procedure: CYSTOSCOPY WITH Lef URETEROSCOPY AND Left STENT PLACEMENT;  Surgeon: Dutch Gray, MD;  Location: [facility];  Service: Urology;  Laterality: Left;  . EUS N/A 02/07/2016   Procedure: ESOPHAGEAL ENDOSCOPIC ULTRASOUND (EUS) RADIAL;  Surgeon: Arta Silence, MD;  Location: [facility];  Service: Endoscopy;  Laterality: N/A;  . EYE SURGERY Bilateral 2014   ioc for catracts   . PARATHYROID EXPLORATION    . WHIPPLE PROCEDURE N/A 04/23/2016   Procedure: WHIPPLE PROCEDURE;  Surgeon: Stark Klein, MD;  Location: [facility];  Service: General;  Laterality: N/A;     Allergies:   Allergies  Allergen Reactions  . Codeine Other (See Comments)    headache  . Doxycycline     unknown  . Gemfibrozil Other (See Comments)    Myalgia   . Hydrocodone-Acetaminophen     unknown  . Lotemax [Loteprednol Etabonate]     burning  . Statins Other (See Comments)    Muscle weakness     Social History:  reports that she has never smoked. She has never used smokeless tobacco. She reports that she does not drink alcohol or use drugs.   Family History: Family  History  Problem Relation Age of Onset  . Heart disease Mother   . Stroke Mother   . Cancer Father     colon  . Alzheimer's disease Sister   . Cancer Brother     brain  . Alzheimer's disease Sister      Physical Exam: Vitals:   04/29/16 0400 04/29/16 0446 04/29/16 0739 04/29/16 0752  BP:  138/68 (!) 145/68   Pulse:  72 69   Resp: 14 13 13 13   Temp:  97.9 F (36.6 C) 98.4 F (36.9 C)   TempSrc:  Oral Oral   SpO2: 98% 97% 99% 99%  Weight:      Height:        General:  Appears calm and comfortable Eyes:  PERRL, EOMI, normal lids, iris ENT: orange tongue, w/ mmm grossly normal hearing, lips & tongue, Neck:  no LAD, masses or  thyromegaly Cardiovascular:  RRR, no m/r/g.  Respiratory:  CTA bilaterally, no w/r/r. Normal respiratory effort. Abdomen: hypoactive bs. Dressings in place, drains w/ serosanguineous drainage Skin:  no rash or induration seen on limited exam Musculoskeletal:  grossly normal tone BUE/BLE, good ROM, no bony abnormality Psychiatric:  grossly normal mood and affect, speech fluent and appropriate, AOx3 Neurologic:  CN 2-12 grossly intact, moves all extremities in coordinated fashion, sensation intact  Data reviewed:  I have personally reviewed following labs and imaging studies Labs:  CBC:  Recent Labs Lab 04/24/16 0359 04/25/16 0405 04/26/16 0425 04/27/16 0220 04/28/16 0421  WBC 13.6* 10.2 9.0 8.0 6.3  HGB 8.4* 7.4* 7.1* 7.3* 7.3*  HCT 27.1* 23.9* 22.1* 22.6* 22.9*  MCV 92.5 94.1 92.1 90.4 89.5  PLT 274 202 202 239 476    Basic Metabolic Panel:  Recent Labs Lab 04/24/16 0359 04/25/16 0405 04/26/16 0425 04/27/16 0220 04/28/16 0421  NA 140 140 140 136 139  K 5.1 4.7 3.9 3.5 3.4*  CL 111 113* 112* 106 108  CO2 18* 21* 22 22 24   GLUCOSE 185* 198* 153* 148* 143*  BUN 50* 46* 31* 22* 16  CREATININE 2.75* 2.72* 2.11* 1.75* 1.65*  CALCIUM 7.6* 8.0* 8.6* 8.8* 8.8*  MG 1.6*  --   --   --   --   PHOS 5.7*  --   --   --   --    GFR Estimated Creatinine Clearance: 26.8 mL/min (by C-G formula based on SCr of 1.65 mg/dL (H)). Liver Function Tests:  Recent Labs Lab 04/24/16 0359 04/25/16 0405 04/26/16 0425 04/27/16 0220 04/28/16 0421  AST 211* 63* 36 26 21  ALT 94* 36 26 22 21   ALKPHOS 15* 18* 18* 23* 28*  BILITOT 0.5 0.4 0.4 0.5 0.6  PROT 5.6* 5.3* 5.0* 5.4* 5.8*  ALBUMIN 3.4* 3.0* 2.7* 2.7* 2.5*   No results for input(s): LIPASE, AMYLASE in the last 168 hours. No results for input(s): AMMONIA in the last 168 hours. Coagulation profile  Recent Labs Lab 04/24/16 0359  INR 1.15    Cardiac Enzymes: No results for input(s): CKTOTAL, CKMB, CKMBINDEX, TROPONINI in  the last 168 hours. BNP: Invalid input(s): POCBNP CBG:  Recent Labs Lab 04/28/16 1635 04/28/16 1918 04/28/16 2302 04/29/16 0447 04/29/16 0743  GLUCAP 136* 139* 127* 153* 114*   D-Dimer No results for input(s): DDIMER in the last 72 hours. Hgb A1c No results for input(s): HGBA1C in the last 72 hours. Lipid Profile No results for input(s): CHOL, HDL, LDLCALC, TRIG, CHOLHDL, LDLDIRECT in the last 72 hours. Thyroid function studies No  results for input(s): TSH, T4TOTAL, T3FREE, THYROIDAB in the last 72 hours.  Invalid input(s): FREET3 Anemia work up No results for input(s): VITAMINB12, FOLATE, FERRITIN, TIBC, IRON, RETICCTPCT in the last 72 hours. Urinalysis    Component Value Date/Time   COLORURINE RED (A) 06/18/2013 1633   APPEARANCEUR TURBID (A) 06/18/2013 1633   LABSPEC 1.013 06/18/2013 1633   PHURINE 5.0 06/18/2013 1633   GLUCOSEU NEGATIVE 06/18/2013 1633   HGBUR LARGE (A) 06/18/2013 1633   BILIRUBINUR MODERATE (A) 06/18/2013 1633   KETONESUR NEGATIVE 06/18/2013 1633   PROTEINUR 100 (A) 06/18/2013 1633   UROBILINOGEN 1.0 06/18/2013 1633   NITRITE NEGATIVE 06/18/2013 1633   LEUKOCYTESUR LARGE (A) 06/18/2013 1633     Microbiology Recent Results (from the past 240 hour(s))  MRSA PCR Screening     Status: None   Collection Time: 04/24/16  8:17 AM  Result Value Ref Range Status   MRSA by PCR NEGATIVE NEGATIVE Final    Comment:        The GeneXpert MRSA Assay (FDA approved for NASAL specimens only), is one component of a comprehensive MRSA colonization surveillance program. It is not intended to diagnose MRSA infection nor to guide or monitor treatment for MRSA infections.        Inpatient Medications:   Scheduled Meds: . calcitRIOL  0.25 mcg Oral Q M,W,F  . chlorhexidine  15 mL Mouth Rinse BID  . cinacalcet  30 mg Oral Q supper  . escitalopram  5 mg Oral QHS  . famotidine (PEPCID) IV  20 mg Intravenous Q24H  . febuxostat  80 mg Oral q morning - 10a   . glyBURIDE  1.25 mg Oral Q breakfast  . hydrALAZINE  25 mg Oral TID  . HYDROmorphone   Intravenous Q4H  . insulin aspart  0-9 Units Subcutaneous Q4H  . mouth rinse  15 mL Mouth Rinse q12n4p  . pantoprazole (PROTONIX) IV  40 mg Intravenous Q12H   Continuous Infusions: . sodium chloride Stopped (04/23/16 1502)  . bupivacaine (MARCAINE, SENSORCAINE) epidural Stopped (04/27/16 1504)  . dextrose 5 % and 0.45% NaCl 100 mL/hr at 04/29/16 0042     Radiological Exams on Admission: No results found.  Impression/Recommendations Active Problems:   CKD (chronic kidney disease) stage 3, GFR 30-59 ml/min   IPMN (intraductal papillary mucinous neoplasm)   GERD (gastroesophageal reflux disease)   Hyperglycemia  IPMN: s/p whipple on 04/26/16. POD#3.  - Continue mgt per primary team CCS  Hyperglycemia: May be in beginings of DM but unable to say for sure at this time due to recent whipple and stress induced hyperglycemia. No previous h/o DM. On D5 1/2NS at 135ml per hour due to minimal oral intake. Started on low dose lantus and SSI. Renal dysfunction makes treatment of this difficult. Metformin is not an option at this time.  -Insulin level now and fasting in am and C-peptide to assess pancreatic insulin production as may have developed acquired Type 1 DM (though may only be limited until remaining pancreatic cells recover). - Will start glyburide (low dose) - Hold lantus for now - Continue SSI - Stop D5 1/2NS per surgery when taking adequate PO.    Reflux esophagitis: worse since surgery.  - continue Protonix - Recommend GI cocktail and addition of pepcid  CKD: Cr 1.65. Improved from presurgical values.  - continue IVF until taking adequate PO.  - BMET in am  Thank you for this consultation.  Our [facility] hospitalist team will follow the patient with you.  MERRELL, DAVID J M.D. [facility] 04/29/2016, 10:09 AM

## 2016-04-30 LAB — BASIC METABOLIC PANEL
Anion gap: 8 (ref 5–15)
BUN: 14 mg/dL (ref 6–20)
CHLORIDE: 108 mmol/L (ref 101–111)
CO2: 23 mmol/L (ref 22–32)
CREATININE: 1.74 mg/dL — AB (ref 0.44–1.00)
Calcium: 8.5 mg/dL — ABNORMAL LOW (ref 8.9–10.3)
GFR calc non Af Amer: 27 mL/min — ABNORMAL LOW (ref >60)
GFR, EST AFRICAN AMERICAN: 31 mL/min — AB (ref >60)
Glucose, Bld: 155 mg/dL — ABNORMAL HIGH (ref 65–99)
Potassium: 3 mmol/L — ABNORMAL LOW (ref 3.5–5.1)
Sodium: 139 mmol/L (ref 135–145)

## 2016-04-30 LAB — CBC
HEMATOCRIT: 22.8 % — AB (ref 36.0–46.0)
HEMOGLOBIN: 7.4 g/dL — AB (ref 12.0–15.0)
MCH: 28.8 pg (ref 26.0–34.0)
MCHC: 32.5 g/dL (ref 30.0–36.0)
MCV: 88.7 fL (ref 78.0–100.0)
Platelets: 304 10*3/uL (ref 150–400)
RBC: 2.57 MIL/uL — ABNORMAL LOW (ref 3.87–5.11)
RDW: 14.8 % (ref 11.5–15.5)
WBC: 7.6 10*3/uL (ref 4.0–10.5)

## 2016-04-30 LAB — GLUCOSE, CAPILLARY
GLUCOSE-CAPILLARY: 123 mg/dL — AB (ref 65–99)
GLUCOSE-CAPILLARY: 140 mg/dL — AB (ref 65–99)
GLUCOSE-CAPILLARY: 148 mg/dL — AB (ref 65–99)
Glucose-Capillary: 145 mg/dL — ABNORMAL HIGH (ref 65–99)
Glucose-Capillary: 133 mg/dL — ABNORMAL HIGH (ref 65–99)

## 2016-04-30 LAB — INSULIN, RANDOM: Insulin: 5.8 u[IU]/mL (ref 2.6–24.9)

## 2016-04-30 MED ORDER — ACETAMINOPHEN 650 MG RE SUPP
650.0000 mg | Freq: Four times a day (QID) | RECTAL | Status: DC | PRN
Start: 2016-04-30 — End: 2016-04-30

## 2016-04-30 MED ORDER — ACETAMINOPHEN 500 MG PO TABS
1000.0000 mg | ORAL_TABLET | Freq: Four times a day (QID) | ORAL | Status: DC | PRN
  Administered 2016-05-01 – 2016-05-06 (×5): 1000 mg via ORAL
  Filled 2016-04-30 (×5): qty 2

## 2016-04-30 MED ORDER — TRAMADOL HCL 50 MG PO TABS
100.0000 mg | ORAL_TABLET | Freq: Two times a day (BID) | ORAL | Status: DC | PRN
  Administered 2016-05-01 – 2016-05-05 (×6): 100 mg via ORAL
  Filled 2016-04-30 (×6): qty 2

## 2016-04-30 MED ORDER — HYDROMORPHONE HCL 2 MG/ML IJ SOLN
0.5000 mg | INTRAMUSCULAR | Status: DC | PRN
  Administered 2016-05-01: 0.5 mg via INTRAVENOUS
  Administered 2016-05-01: 1 mg via INTRAVENOUS
  Administered 2016-05-02: 0.5 mg via INTRAVENOUS
  Administered 2016-05-03 (×2): 1 mg via INTRAVENOUS
  Administered 2016-05-03: 0.5 mg via INTRAVENOUS
  Filled 2016-04-30 (×6): qty 1

## 2016-04-30 MED ORDER — INSULIN GLARGINE 100 UNIT/ML ~~LOC~~ SOLN
4.0000 [IU] | Freq: Every day | SUBCUTANEOUS | Status: DC
  Administered 2016-05-01 – 2016-05-06 (×6): 4 [IU] via SUBCUTANEOUS
  Filled 2016-04-30 (×6): qty 0.04

## 2016-04-30 NOTE — Progress Notes (Signed)
Rehab admissions - Rehab beds remain full with very limited bed availability all week.  Noted patient prefers [facility].  #[phone]

## 2016-04-30 NOTE — Plan of Care (Signed)
Problem: Pain Managment: Goal: General experience of comfort will improve Outcome: Progressing Less pain today, using pca

## 2016-04-30 NOTE — Plan of Care (Signed)
Problem: Bowel/Gastric: Goal: Will not experience complications related to bowel motility Outcome: Progressing Patient having active bowel sounds. Tolerating certain foods (ice cream, etc.) Still no bowel movement tonight.

## 2016-04-30 NOTE — Progress Notes (Addendum)
PROGRESS NOTE  Kim Burns  FTD:[id number] DOB: [birth year] DOA: 04/23/2016 PCP: Rachell Cipro, MD   Outpatient Specialists:  General surgery Neurology Nephrology  Requesting physician: Dr Barry Dienes - general surgery  Reason for consultation: Hyperglycemia  Brief Narrative: Kim Burns is an 81 y.o. female who presented to [facility] on 04/26/2016 for elective whipple procedure for intraductal papillary mucinous neoplasm in the pancreatic head. Postoperatively, she has had typical abdominal pain and slow return of appetite. There is no personal or significant family history of DM, but hyperglycemia has been noted for which [facility] is consulted.   Assessment & Plan: Active Problems:   CKD (chronic kidney disease) stage 3, GFR 30-59 ml/min   IPMN (intraductal papillary mucinous neoplasm)   GERD (gastroesophageal reflux disease)   Hyperglycemia  Hyperglycemia: Mild. Stress-induced and pancreatic resection likely unmasked diminished beta cell reserve. She is at inpatient goal < 180mg /dl. No known h/o DM.  - CBG over past 24 hours: 102-155, received 6u novolog. Using caution in renal impairment, no metformin. - Check HbA1c for previous baseline - Insulin and C-peptide levels sent and pending - Glyburide started 3/12 as substitution for lantus > will switch back to lantus qHS (got glyburide this AM) and adjust dose as necessary based on SSI requirements. Anticipate she may behave like type 1 diabetic. - Continue SSI. - Stop D5 1/2NS per surgery when taking adequate PO, recommend carbohydrate-modified diet. - Nutrition has seen the patient   IPMN: s/p whipple on 04/26/16. - Per primary team, CCS   CKD, stage III - IV: Creatinine at presumed baseline ~1.6-2.0. Improved from pre-op.  - continue IVF until taking adequate PO.  - BMET in am  Reflux esophagitis: worse since surgery.  - Continue PPI, pepcid and GI cocktails prn  Thank you for this consultation.  Our [facility]  hospitalist team will follow the patient with you.  Subjective: Pt with epigastric abdominal pain, stable with associated belching and occasional hiccups all better when laying supine. Denies recent or current vision changes, polyuria/polydipsia, dysesthesias in lower extremities.  Objective: BP (!) 150/78 (BP Location: Right Arm)   Pulse 80   Temp 98.2 F (36.8 C) (Oral)   Resp 19   Ht 5\' 4"  (1.626 m)   Wt 69 kg (152 lb 1.9 oz)   SpO2 100%   BMI 26.11 kg/m    Intake/Output Summary (Last 24 hours) at 04/30/16 1041 Last data filed at 04/30/16 0800  Gross per 24 hour  Intake             2880 ml  Output             1145 ml  Net             1735 ml   Filed Weights   04/26/16 1356  Weight: 69 kg (152 lb 1.9 oz)   Examination: General exam: 81 y.o. female in no distress  Respiratory system: Non-labored breathing ambient air. Clear to auscultation bilaterally.  Cardiovascular system: Regular rate and rhythm. No murmur, rub, or gallop. No JVD, and trace pedal edema. Gastrointestinal system: Abdominal dressings c/d/i, 2 drains with serosanguinous draiange. +BS. Appropriately tender but soft without rebound. Central nervous system: Alert and oriented. No focal neurological deficits. Sensation grossly normal to very light touch in feet. Extremities: Warm, no deformities Skin: Abdominal wounds as above, otherwise none noted. Psychiatry: Judgement and insight appear normal. Mood & affect appropriate.   Data Reviewed: I have personally reviewed following labs and imaging studies  CBC:  Recent Labs Lab 04/25/16 0405 04/26/16 0425 04/27/16 0220 04/28/16 0421 04/30/16 0217  WBC 10.2 9.0 8.0 6.3 7.6  HGB 7.4* 7.1* 7.3* 7.3* 7.4*  HCT 23.9* 22.1* 22.6* 22.9* 22.8*  MCV 94.1 92.1 90.4 89.5 88.7  PLT 202 202 239 249 771   Basic Metabolic Panel:  Recent Labs Lab 04/24/16 0359 04/25/16 0405 04/26/16 0425 04/27/16 0220 04/28/16 0421 04/30/16 0217  NA 140 140 140 136 139 139    K 5.1 4.7 3.9 3.5 3.4* 3.0*  CL 111 113* 112* 106 108 108  CO2 18* 21* 22 22 24 23   GLUCOSE 185* 198* 153* 148* 143* 155*  BUN 50* 46* 31* 22* 16 14  CREATININE 2.75* 2.72* 2.11* 1.75* 1.65* 1.74*  CALCIUM 7.6* 8.0* 8.6* 8.8* 8.8* 8.5*  MG 1.6*  --   --   --   --   --   PHOS 5.7*  --   --   --   --   --    GFR: Estimated Creatinine Clearance: 25.4 mL/min (by C-G formula based on SCr of 1.74 mg/dL (H)). Liver Function Tests:  Recent Labs Lab 04/24/16 0359 04/25/16 0405 04/26/16 0425 04/27/16 0220 04/28/16 0421  AST 211* 63* 36 26 21  ALT 94* 36 26 22 21   ALKPHOS 15* 18* 18* 23* 28*  BILITOT 0.5 0.4 0.4 0.5 0.6  PROT 5.6* 5.3* 5.0* 5.4* 5.8*  ALBUMIN 3.4* 3.0* 2.7* 2.7* 2.5*   HbA1C: No results for input(s): HGBA1C in the last 72 hours. CBG:  Recent Labs Lab 04/29/16 1704 04/29/16 1925 04/29/16 2304 04/30/16 0335 04/30/16 0752  GLUCAP 146* 136* 102* 145* 123*   Lipid Profile: No results for input(s): CHOL, HDL, LDLCALC, TRIG, CHOLHDL, LDLDIRECT in the last 72 hours.  Urine analysis:    Component Value Date/Time   COLORURINE RED (A) 06/18/2013 1633   APPEARANCEUR TURBID (A) 06/18/2013 1633   LABSPEC 1.013 06/18/2013 1633   PHURINE 5.0 06/18/2013 1633   GLUCOSEU NEGATIVE 06/18/2013 1633   HGBUR LARGE (A) 06/18/2013 1633   BILIRUBINUR MODERATE (A) 06/18/2013 1633   KETONESUR NEGATIVE 06/18/2013 1633   PROTEINUR 100 (A) 06/18/2013 1633   UROBILINOGEN 1.0 06/18/2013 1633   NITRITE NEGATIVE 06/18/2013 1633   LEUKOCYTESUR LARGE (A) 06/18/2013 1633    LOS: 7 days    Vance Gather, MD [facility] Pager [phone]  If 7PM-7AM, please contact night-coverage [email or web address] Password [facility] 04/30/2016, 10:41 AM

## 2016-04-30 NOTE — Progress Notes (Signed)
Occupational Therapy Treatment Patient Details Name: Kim Burns MRN: [id number] DOB: [birth year] Today's Date: 04/30/2016    History of present illness Pt is a 81 y/o female s/p whipple procedure secondary to pancreatic mass. PMH including but not limited to CKD, HTN and hx of CVA   OT comments  Pain limiting pt's mobility and ability to perform LB ADL. Focus of session on toileting and seated grooming. Pt continue to be appropriate for SNF for post acute rehab. Will follow.  Follow Up Recommendations  SNF;Supervision/Assistance - 24 hour    Equipment Recommendations  3 in 1 bedside commode    Recommendations for Other Services      Precautions / Restrictions Precautions Precautions: Fall Precaution Comments: abdominal drains x 2 Restrictions Weight Bearing Restrictions: No       Mobility Bed Mobility               General bed mobility comments: pt received in recliner  Transfers Overall transfer level: Needs assistance Equipment used: 1 person hand held assist Transfers: Sit to/from Stand;Stand Pivot Transfers Sit to Stand: Min assist Stand pivot transfers: Min assist       General transfer comment: min A to stand from recliner, min A and vc's for safe sitting back to recliner    Balance   Sitting-balance support: Feet supported Sitting balance-Leahy Scale: Fair       Standing balance-Leahy Scale: Poor                     ADL Overall ADL's : Needs assistance/impaired Eating/Feeding: Independent;Sitting   Grooming: Set up;Sitting;Wash/dry hands;Wash/dry face;Brushing hair;Oral care                   Toilet Transfer: Minimal assistance;Stand-pivot;BSC   Toileting- Clothing Manipulation and Hygiene: Minimal assistance;Sit to/from stand         General ADL Comments: limited by pain and poor endurnace      Vision                     Perception     Praxis      Cognition   Behavior During Therapy: WFL for tasks  assessed/performed Overall Cognitive Status: Within Functional Limits for tasks assessed                         Exercises     Shoulder Instructions       General Comments      Pertinent Vitals/ Pain       Pain Assessment: 0-10 Pain Score: 6  Pain Location: abdomen Pain Descriptors / Indicators: Grimacing;Guarding;Constant Pain Intervention(s): Monitored during session;PCA encouraged  Home Living                                          Prior Functioning/Environment              Frequency  Min 2X/week        Progress Toward Goals  OT Goals(current goals can now be found in the care plan section)  Progress towards OT goals: Progressing toward goals  Acute Rehab OT Goals Patient Stated Goal: go to rehab to get stronger Time For Goal Achievement: 05/12/16 Potential to Achieve Goals: Good  Plan Discharge plan remains appropriate    Co-evaluation  End of Session    OT Visit Diagnosis: Unsteadiness on feet (R26.81);Muscle weakness (generalized) (M62.81);Pain Pain - part of body:  (abdomen)   Activity Tolerance Patient limited by pain;Patient limited by fatigue   Patient Left in chair;with call bell/phone within reach;with chair alarm set   Nurse Communication          Time: [phone] OT Time Calculation (min): 15 min  Charges: OT General Charges $OT Visit: 1 Procedure OT Treatments $Self Care/Home Management : 8-22 mins   Malka So 04/30/2016, 9:43 AM [phone]

## 2016-04-30 NOTE — Progress Notes (Signed)
Report called and given for transfer to [facility] patient stable. Patient wearing glasses and took a little bag with hair brushes and comb with a vase of flowers no other belongings with patient. Patient reported that family took cell phone home.

## 2016-04-30 NOTE — Progress Notes (Signed)
7 Days Post-Op  Subjective: Continues to have reflux.  Getting a little hungry.    Objective: Vital signs in last 24 hours: Temp:  [98.1 F (36.7 C)-99.2 F (37.3 C)] 98.5 F (36.9 C) (03/13 1653) Pulse Rate:  [75-88] 88 (03/13 1653) Resp:  [12-22] 15 (03/13 1653) BP: (140-170)/(60-86) 141/60 (03/13 1653) SpO2:  [96 %-100 %] 100 % (03/13 1653) Last BM Date:  (PTA)  Intake/Output from previous day: 03/12 0701 - 03/13 0700 In: 2350 [P.O.:600; I.V.:1700; IV Piggyback:50] Out: 605 [Urine:450; Drains:155] Intake/Output this shift: Total I/O In: 1920 [P.O.:120; I.V.:1800] Out: 885 [Urine:800; Drains:85]  General appearance: alert, cooperative and no distress Resp: breathing comfortably GI: soft, mildly distended, but less so.  approp tender.  Drains serosang.    Lab Results:   Recent Labs  04/28/16 0421 04/30/16 0217  WBC 6.3 7.6  HGB 7.3* 7.4*  HCT 22.9* 22.8*  PLT 249 304    BMET  Recent Labs  04/28/16 0421 04/30/16 0217  NA 139 139  K 3.4* 3.0*  CL 108 108  CO2 24 23  GLUCOSE 143* 155*  BUN 16 14  CREATININE 1.65* 1.74*  CALCIUM 8.8* 8.5*   PT/INR No results for input(s): LABPROT, INR in the last 72 hours.   Recent Labs Lab 04/24/16 0359 04/25/16 0405 04/26/16 0425 04/27/16 0220 04/28/16 0421  AST 211* 63* 36 26 21  ALT 94* 36 26 22 21   ALKPHOS 15* 18* 18* 23* 28*  BILITOT 0.5 0.4 0.4 0.5 0.6  PROT 5.6* 5.3* 5.0* 5.4* 5.8*  ALBUMIN 3.4* 3.0* 2.7* 2.7* 2.5*     Lipase     Component Value Date/Time   LIPASE 35 06/18/2013 1435     Studies/Results: No results found. Prior to Admission medications   Medication Sig Start Date End Date Taking? Authorizing Provider  Artificial Tear Solution (GENTEAL TEARS OP) Apply 1 drop to eye 3 (three) times daily as needed (dry eyes).    Yes Historical Provider, MD  calcitRIOL (ROCALTROL) 0.25 MCG capsule Take 0.25 mcg by mouth every Monday, Wednesday, and Friday. At night   Yes Historical Provider, MD   cinacalcet (SENSIPAR) 30 MG tablet Take 30 mg by mouth daily with supper.   Yes Historical Provider, MD  clopidogrel (PLAVIX) 75 MG tablet Take 75 mg by mouth every morning.   Yes Historical Provider, MD  diclofenac sodium (VOLTAREN) 1 % GEL Apply 2 g topically 4 (four) times daily as needed (hand pain).    Yes Historical Provider, MD  diphenhydramine-acetaminophen (TYLENOL PM) 25-500 MG TABS Take 2 tablets by mouth at bedtime as needed (Sleep).    Yes Historical Provider, MD  escitalopram (LEXAPRO) 5 MG tablet Take 5 mg by mouth at bedtime.   Yes Historical Provider, MD  Febuxostat (ULORIC) 80 MG TABS Take 80 mg by mouth every morning.    Yes Historical Provider, MD  fenofibrate (TRICOR) 145 MG tablet Take 145 mg by mouth every morning.    Yes Historical Provider, MD  hydrALAZINE (APRESOLINE) 25 MG tablet Take 25 mg by mouth 3 (three) times daily.   Yes Historical Provider, MD  Omega-3 Fatty Acids (FISH OIL) 1200 MG CAPS Take 2,400 mg by mouth daily.   Yes Historical Provider, MD  telmisartan (MICARDIS) 80 MG tablet Take 80 mg by mouth at bedtime.    Yes Historical Provider, MD  Vitamin D, Ergocalciferol, (DRISDOL) 50000 UNITS CAPS capsule Take 50,000 Units by mouth every 7 (seven) days. Wednesdays   Yes Historical Provider,  MD  acetaminophen (TYLENOL) 500 MG tablet Take 1,000 mg by mouth every 6 (six) hours as needed for moderate pain.    Historical Provider, MD  azelastine (ASTELIN) 0.1 % nasal spray Place 1 spray into both nostrils at bedtime as needed for rhinitis. Use in each nostril as directed    Historical Provider, MD  clobetasol (TEMOVATE) 0.05 % GEL Apply 1 application topically daily as needed (itching).    Historical Provider, MD    Medications: . calcitRIOL  0.25 mcg Oral Q M,W,F  . chlorhexidine  15 mL Mouth Rinse BID  . cinacalcet  30 mg Oral Q supper  . escitalopram  5 mg Oral QHS  . famotidine  20 mg Oral Daily  . febuxostat  80 mg Oral q morning - 10a  . hydrALAZINE  25  mg Oral TID  . insulin aspart  0-9 Units Subcutaneous Q4H  . [START ON 05/01/2016] insulin glargine  4 Units Subcutaneous Daily  . mouth rinse  15 mL Mouth Rinse q12n4p  . pantoprazole  40 mg Oral BID   . sodium chloride Stopped (04/23/16 1502)  . bupivacaine (MARCAINE, SENSORCAINE) epidural Stopped (04/27/16 1504)  . dextrose 5 % and 0.45% NaCl 100 mL/hr at 04/30/16 1800     1. Whipple procedure/resection, w/ Omentum - INTRADUCTAL PAPILLARY MUCINOUS NEOPLASM WITH FOCI OF HIGH GRADE DYSPLASIA. - NO INVASIVE CARCINOMA. - EIGHT BENIGN LYMPH NODE (0/8). - INCIDENTAL BENIGN DUODENAL LYMPHANGIOMA. 2. Pancreas, biopsy, Additional pancreatic margin - PANCREATIC DUCT INVOLVED BY INTRADUCTAL PAPILLARY MUCINOUS NEOPLASM. - NO INVASIVE CARCINOMA. 3. Lymph node, biopsy, Portal - TWO BENIGN LYMPH NODES (0/2).  Assessment/Plan IPMN pancreatic head S/p Classic pancreaticoduodenectomy , Placement of pancreatic stent, Repair of umbilical hernia as part of closure 04/23/16 Hypertension Chronic kidney disease Sleep apnea Hx of TIA ABL anemia FEN:  IV fluids/clear liquids Hyperglycemia - consult medicine.   DVT:  SCD   Plan:  SW working on SNF placement.   Soft diet. Appreciate medicine input and management of blood sugars. D/C PCA To floor.   LOS: 7 days    [facility] 04/30/2016

## 2016-05-01 LAB — GLUCOSE, CAPILLARY
GLUCOSE-CAPILLARY: 138 mg/dL — AB (ref 65–99)
GLUCOSE-CAPILLARY: 118 mg/dL — AB (ref 65–99)
GLUCOSE-CAPILLARY: 133 mg/dL — AB (ref 65–99)
Glucose-Capillary: 107 mg/dL — ABNORMAL HIGH (ref 65–99)
Glucose-Capillary: 128 mg/dL — ABNORMAL HIGH (ref 65–99)
Glucose-Capillary: 172 mg/dL — ABNORMAL HIGH (ref 65–99)

## 2016-05-01 LAB — TYPE AND SCREEN
ABO/RH(D): A POS
ANTIBODY SCREEN: NEGATIVE
UNIT DIVISION: 0
UNIT DIVISION: 0
Unit division: 0
Unit division: 0

## 2016-05-01 LAB — BPAM RBC
BLOOD PRODUCT EXPIRATION DATE: 201803282359
BLOOD PRODUCT EXPIRATION DATE: 201803282359
BLOOD PRODUCT EXPIRATION DATE: 201803282359
Blood Product Expiration Date: 201803282359
ISSUE DATE / TIME: 201803061213
ISSUE DATE / TIME: 201803061213
UNIT TYPE AND RH: 6200
Unit Type and Rh: 6200
Unit Type and Rh: 6200
Unit Type and Rh: 6200

## 2016-05-01 LAB — HEMOGLOBIN A1C
Hgb A1c MFr Bld: 5.4 % (ref 4.8–5.6)
Mean Plasma Glucose: 108 mg/dL

## 2016-05-01 LAB — INSULIN, RANDOM: INSULIN: 6.3 u[IU]/mL (ref 2.6–24.9)

## 2016-05-01 MED ORDER — SIMETHICONE 80 MG PO CHEW
80.0000 mg | CHEWABLE_TABLET | Freq: Four times a day (QID) | ORAL | Status: DC | PRN
  Administered 2016-05-01: 80 mg via ORAL
  Filled 2016-05-01: qty 1

## 2016-05-01 MED ORDER — SENNOSIDES-DOCUSATE SODIUM 8.6-50 MG PO TABS
1.0000 | ORAL_TABLET | Freq: Two times a day (BID) | ORAL | Status: DC
  Administered 2016-05-01 – 2016-05-06 (×8): 1 via ORAL
  Filled 2016-05-01 (×9): qty 1

## 2016-05-01 MED ORDER — INSULIN ASPART 100 UNIT/ML ~~LOC~~ SOLN
0.0000 [IU] | Freq: Three times a day (TID) | SUBCUTANEOUS | Status: DC
  Administered 2016-05-01 – 2016-05-02 (×4): 1 [IU] via SUBCUTANEOUS

## 2016-05-01 MED ORDER — POLYETHYLENE GLYCOL 3350 17 G PO PACK
17.0000 g | PACK | Freq: Every day | ORAL | Status: DC
  Administered 2016-05-01 – 2016-05-03 (×3): 17 g via ORAL
  Filled 2016-05-01 (×5): qty 1

## 2016-05-01 MED ORDER — BISACODYL 10 MG RE SUPP
10.0000 mg | Freq: Every day | RECTAL | Status: DC
  Administered 2016-05-01: 10 mg via RECTAL
  Filled 2016-05-01 (×4): qty 1

## 2016-05-01 MED ORDER — POTASSIUM CHLORIDE 20 MEQ/15ML (10%) PO SOLN
40.0000 meq | Freq: Every day | ORAL | Status: DC
  Administered 2016-05-01: 40 meq via ORAL
  Filled 2016-05-01: qty 30

## 2016-05-01 NOTE — Clinical Social Work Note (Signed)
Patient transferred from 4E to 6N. Handoff information given to 6N CSW.  This CSW signing off.  Dayton Scrape, [place]

## 2016-05-01 NOTE — Progress Notes (Signed)
PROGRESS NOTE    Kim Burns  ALP:[id number] DOB: 06/21/1935 DOA: 04/23/2016 PCP: Rachell Cipro, MD   Outpatient Specialists: General surgery Neurology Nephrology  Requesting physician: Dr Barry Dienes - general surgery  Reason for consultation: Hyperglycemia  Brief Narrative:  81 y.o.femalewho [facility] for elective whipple procedure for intraductal papillary mucinous neoplasm in the pancreatic head. Postoperatively, she has had typical abdominal pain and slow return of appetite. There is no personal or significant family history of DM, but hyperglycemia has been noted for which [facility] is consulted.   Assessment & Plan:   Active Problems:   CKD (chronic kidney disease) stage 3, GFR 30-59 ml/min   IPMN (intraductal papillary mucinous neoplasm)   GERD (gastroesophageal reflux disease)   Hyperglycemia  Hyperglycemia: Mild. Stress-induced and pancreatic resection likely unmasked diminished beta cell reserve. She is at inpatient goal < 180mg /dl. No known h/o DM.  - CBG over past 24 hours peaked to 172. Patient admits to liberal diet consisting of sugary foods - Check HbA1c 5.4 - Insulin levels within normal limits - Glyburide started 3/12 as substitution for lantus > currently on lantus qHS (got glyburide this AM) and adjust dose as necessary based on SSI requirements.  - Continue SSI. - Nutrition has seen the patient  - Stable  IPMN: s/p whipple on 04/26/16. - Per primary team, CCS  - Currently stable  CKD, stage III - IV: Creatinine at presumed baseline ~1.6-2.0. Improved from pre-op.  - continue IVF until taking adequate PO.  - renal function stable  Reflux esophagitis: worse since surgery.  - Continue PPI, pepcid and GI cocktails prn - Presently stable  Antimicrobials: Anti-infectives    Start     Dose/Rate Route Frequency Ordered Stop   04/23/16 1830  ceFAZolin (ANCEF) IVPB 2g/100 mL premix     2 g 200 mL/hr over 30  Minutes Intravenous Every 8 hours 04/23/16 1701 04/23/16 1828   04/23/16 0747  ceFAZolin (ANCEF) IVPB 2g/100 mL premix     2 g 200 mL/hr over 30 Minutes Intravenous On call to O.R. 04/23/16 0747 04/23/16 1027       Subjective: No complaints this AM  Objective: Vitals:   04/30/16 1914 04/30/16 2056 05/01/16 0443 05/01/16 1419  BP: (!) 152/77 (!) 160/60 (!) 145/66 (!) 152/68  Pulse: 84 74 88 69  Resp: 20 20 19 18   Temp: 99.4 F (37.4 C) 98 F (36.7 C) 98.1 F (36.7 C) 97.6 F (36.4 C)  TempSrc: Oral Oral Oral Oral  SpO2: 100% 100% 100% 100%  Weight:  74.8 kg (165 lb)    Height:  5\' 4"  (1.626 m)      Intake/Output Summary (Last 24 hours) at 05/01/16 1503 Last data filed at 05/01/16 1145  Gross per 24 hour  Intake          2358.33 ml  Output             1250 ml  Net          1108.33 ml   Filed Weights   04/26/16 1356 04/30/16 2056  Weight: 69 kg (152 lb 1.9 oz) 74.8 kg (165 lb)    Examination:  General exam: Appears calm and comfortable  Respiratory system: Clear to auscultation. Respiratory effort normal. Cardiovascular system: S1 & S2 heard, Gastrointestinal system: Abdomen is nondistended, soft and nontender. No organomegaly or masses felt. Normal bowel sounds heard. Central nervous system: Alert and oriented. No focal neurological deficits. Extremities: Symmetric 5 x 5  power. Skin: No rashes, lesions or ulcers Psychiatry: Judgement and insight appear normal. Mood & affect appropriate.   Data Reviewed: I have personally reviewed following labs and imaging studies  CBC:  Recent Labs Lab 04/25/16 0405 04/26/16 0425 04/27/16 0220 04/28/16 0421 04/30/16 0217  WBC 10.2 9.0 8.0 6.3 7.6  HGB 7.4* 7.1* 7.3* 7.3* 7.4*  HCT 23.9* 22.1* 22.6* 22.9* 22.8*  MCV 94.1 92.1 90.4 89.5 88.7  PLT 202 202 239 249 440   Basic Metabolic Panel:  Recent Labs Lab 04/25/16 0405 04/26/16 0425 04/27/16 0220 04/28/16 0421 04/30/16 0217  NA 140 140 136 139 139  K 4.7  3.9 3.5 3.4* 3.0*  CL 113* 112* 106 108 108  CO2 21* 22 22 24 23   GLUCOSE 198* 153* 148* 143* 155*  BUN 46* 31* 22* 16 14  CREATININE 2.72* 2.11* 1.75* 1.65* 1.74*  CALCIUM 8.0* 8.6* 8.8* 8.8* 8.5*   GFR: Estimated Creatinine Clearance: 26.4 mL/min (by C-G formula based on SCr of 1.74 mg/dL (H)). Liver Function Tests:  Recent Labs Lab 04/25/16 0405 04/26/16 0425 04/27/16 0220 04/28/16 0421  AST 63* 36 26 21  ALT 36 26 22 21   ALKPHOS 18* 18* 23* 28*  BILITOT 0.4 0.4 0.5 0.6  PROT 5.3* 5.0* 5.4* 5.8*  ALBUMIN 3.0* 2.7* 2.7* 2.5*   No results for input(s): LIPASE, AMYLASE in the last 168 hours. No results for input(s): AMMONIA in the last 168 hours. Coagulation Profile: No results for input(s): INR, PROTIME in the last 168 hours. Cardiac Enzymes: No results for input(s): CKTOTAL, CKMB, CKMBINDEX, TROPONINI in the last 168 hours. BNP (last 3 results) No results for input(s): PROBNP in the last 8760 hours. HbA1C:  Recent Labs  04/30/16 0842  HGBA1C 5.4   CBG:  Recent Labs Lab 04/30/16 1916 05/01/16 0000 05/01/16 0416 05/01/16 0811 05/01/16 1145  GLUCAP 148* 107* 172* 128* 138*   Lipid Profile: No results for input(s): CHOL, HDL, LDLCALC, TRIG, CHOLHDL, LDLDIRECT in the last 72 hours. Thyroid Function Tests: No results for input(s): TSH, T4TOTAL, FREET4, T3FREE, THYROIDAB in the last 72 hours. Anemia Panel: No results for input(s): VITAMINB12, FOLATE, FERRITIN, TIBC, IRON, RETICCTPCT in the last 72 hours. Sepsis Labs: No results for input(s): PROCALCITON, LATICACIDVEN in the last 168 hours.  Recent Results (from the past 240 hour(s))  MRSA PCR Screening     Status: None   Collection Time: 04/24/16  8:17 AM  Result Value Ref Range Status   MRSA by PCR NEGATIVE NEGATIVE Final    Comment:        The GeneXpert MRSA Assay (FDA approved for NASAL specimens only), is one component of a comprehensive MRSA colonization surveillance program. It is not intended  to diagnose MRSA infection nor to guide or monitor treatment for MRSA infections.      Radiology Studies: No results found.  Scheduled Meds: . bisacodyl  10 mg Rectal Daily  . calcitRIOL  0.25 mcg Oral Q M,W,F  . chlorhexidine  15 mL Mouth Rinse BID  . cinacalcet  30 mg Oral Q supper  . escitalopram  5 mg Oral QHS  . famotidine  20 mg Oral Daily  . febuxostat  80 mg Oral q morning - 10a  . hydrALAZINE  25 mg Oral TID  . insulin aspart  0-9 Units Subcutaneous TID AC & HS  . insulin glargine  4 Units Subcutaneous Daily  . mouth rinse  15 mL Mouth Rinse q12n4p  . pantoprazole  40 mg Oral  BID  . polyethylene glycol  17 g Oral Daily  . potassium chloride  40 mEq Oral Daily  . senna-docusate  1 tablet Oral BID   Continuous Infusions: . sodium chloride Stopped (04/23/16 1502)  . dextrose 5 % and 0.45% NaCl 75 mL/hr at 05/01/16 0819     LOS: 8 days   Aadhav Uhlig, Orpah Melter, MD [facility] Pager [phone]  If 7PM-7AM, please contact night-coverage [email or web address] Password [facility] 05/01/2016, 3:03 PM

## 2016-05-01 NOTE — Progress Notes (Signed)
Physical Therapy Treatment Patient Details Name: Kim Burns MRN: [id number] DOB: 06/03/1935 Today's Date: 05/01/2016    History of Present Illness Pt is a 81 y/o female s/p whipple procedure secondary to pancreatic mass. PMH including but not limited to CKD, HTN and hx of CVA    PT Comments    Pt making good progress today. Pt is min guard with bed mobility, transfers and ambulation of 2 x50' with RW Pt required seated rest break due to fatigue. Pt indicated that physician recommended her for CIR and I concur with this recommendation. Pt continues to requires skilled PT to increase her ambulation and to improve her strength and endurance to be safe in her environment.    Follow Up Recommendations  Supervision/Assistance - 24 hour;CIR     Equipment Recommendations  Rolling walker with 5" wheels    Recommendations for Other Services       Precautions / Restrictions Precautions Precautions: Fall Precaution Comments: abdominal drains x 2 Restrictions Weight Bearing Restrictions: No    Mobility  Bed Mobility Overal bed mobility: Modified Independent Bed Mobility: Supine to Sit     Supine to sit: Supervision     General bed mobility comments: pt given vc for hand placement  Transfers Overall transfer level: Needs assistance Equipment used: Rolling walker (2 wheeled) Transfers: Sit to/from Stand Sit to Stand: Min guard         General transfer comment: pt transfered from bed to recliner to toilet and back to recliner with min guard assist all times  Ambulation/Gait Ambulation/Gait assistance: Min guard Ambulation Distance (Feet): 100 Feet (2 x50 feet with seated rest break) Assistive device: Rolling walker (2 wheeled) Gait Pattern/deviations: Step-through pattern;Decreased stride length;Shuffle Gait velocity: decreased Gait velocity interpretation: Below normal speed for age/gender General Gait Details: pt with slight dizziness today but tolerable and did  not effect balance. Pt required rest break due to fatigue not dizziness      Balance Overall balance assessment: Needs assistance Sitting-balance support: Feet supported Sitting balance-Leahy Scale: Fair     Standing balance support: During functional activity;Bilateral upper extremity supported Standing balance-Leahy Scale: Fair Standing balance comment: able to wash hands and clean herself after toileting'                    Cognition Arousal/Alertness: Awake/alert Behavior During Therapy: WFL for tasks assessed/performed Overall Cognitive Status: Within Functional Limits for tasks assessed                             Pertinent Vitals/Pain Pain Assessment: 0-10 Pain Score: 3  Pain Location: headache Pain Descriptors / Indicators: Dull Pain Intervention(s): Monitored during session  VSS           PT Goals (current goals can now be found in the care plan section) Acute Rehab PT Goals Patient Stated Goal: go to rehab to get stronger PT Goal Formulation: With patient Time For Goal Achievement: 05/11/16 Potential to Achieve Goals: Good    Frequency    Min 3X/week      PT Plan Discharge plan needs to be updated    Co-evaluation             End of Session Equipment Utilized During Treatment: Gait belt Activity Tolerance: Patient limited by fatigue Patient left: in chair;with call bell/phone within reach Nurse Communication: Mobility status PT Visit Diagnosis: Other abnormalities of gait and mobility (R26.89);Pain;Dizziness and giddiness (R42) Pain - part  of body:  (head/ abdomen)     Time: 8478-4128 PT Time Calculation (min) (ACUTE ONLY): 37 min  Charges:  $Gait Training: 8-22 mins $Therapeutic Activity: 8-22 mins                    G Codes:       Bailey Mech Fleet 05/01/2016, 2:15 PM  Dani Gobble. Migdalia Dk PT, DPT Acute Rehabilitation  [phone] Pager [phone]

## 2016-05-01 NOTE — Progress Notes (Signed)
8 Days Post-Op  Subjective: Pt tolerated some soft food, however, she is having horrible gas pains.  She has not had a BM yet.     Objective: Vital signs in last 24 hours: Temp:  [98 F (36.7 C)-99.4 F (37.4 C)] 98.1 F (36.7 C) (03/14 0443) Pulse Rate:  [74-88] 88 (03/14 0443) Resp:  [13-22] 19 (03/14 0443) BP: (141-160)/(60-86) 145/66 (03/14 0443) SpO2:  [98 %-100 %] 100 % (03/14 0443) Weight:  [74.8 kg (165 lb)] 74.8 kg (165 lb) (03/13 2056) Last BM Date:  (last BM prior to surgery MD aware per RN report)  Intake/Output from previous day: 03/13 0701 - 03/14 0700 In: 3158.3 [P.O.:120; I.V.:3038.3] Out: 1590 [Urine:1400; Drains:190] Intake/Output this shift: No intake/output data recorded.  General appearance: alert, cooperative and no distress Resp: breathing comfortably GI: soft, moderately distended, but less so.  approp tender.  Drains serosang.    Lab Results:   Recent Labs  04/30/16 0217  WBC 7.6  HGB 7.4*  HCT 22.8*  PLT 304    BMET  Recent Labs  04/30/16 0217  NA 139  K 3.0*  CL 108  CO2 23  GLUCOSE 155*  BUN 14  CREATININE 1.74*  CALCIUM 8.5*   PT/INR No results for input(s): LABPROT, INR in the last 72 hours.   Recent Labs Lab 04/25/16 0405 04/26/16 0425 04/27/16 0220 04/28/16 0421  AST 63* 36 26 21  ALT 36 26 22 21   ALKPHOS 18* 18* 23* 28*  BILITOT 0.4 0.4 0.5 0.6  PROT 5.3* 5.0* 5.4* 5.8*  ALBUMIN 3.0* 2.7* 2.7* 2.5*     Lipase     Component Value Date/Time   LIPASE 35 06/18/2013 1435     Studies/Results: No results found. Prior to Admission medications   Medication Sig Start Date End Date Taking? Authorizing Provider  Artificial Tear Solution (GENTEAL TEARS OP) Apply 1 drop to eye 3 (three) times daily as needed (dry eyes).    Yes Historical Provider, MD  calcitRIOL (ROCALTROL) 0.25 MCG capsule Take 0.25 mcg by mouth every Monday, Wednesday, and Friday. At night   Yes Historical Provider, MD  cinacalcet (SENSIPAR) 30 MG  tablet Take 30 mg by mouth daily with supper.   Yes Historical Provider, MD  clopidogrel (PLAVIX) 75 MG tablet Take 75 mg by mouth every morning.   Yes Historical Provider, MD  diclofenac sodium (VOLTAREN) 1 % GEL Apply 2 g topically 4 (four) times daily as needed (hand pain).    Yes Historical Provider, MD  diphenhydramine-acetaminophen (TYLENOL PM) 25-500 MG TABS Take 2 tablets by mouth at bedtime as needed (Sleep).    Yes Historical Provider, MD  escitalopram (LEXAPRO) 5 MG tablet Take 5 mg by mouth at bedtime.   Yes Historical Provider, MD  Febuxostat (ULORIC) 80 MG TABS Take 80 mg by mouth every morning.    Yes Historical Provider, MD  fenofibrate (TRICOR) 145 MG tablet Take 145 mg by mouth every morning.    Yes Historical Provider, MD  hydrALAZINE (APRESOLINE) 25 MG tablet Take 25 mg by mouth 3 (three) times daily.   Yes Historical Provider, MD  Omega-3 Fatty Acids (FISH OIL) 1200 MG CAPS Take 2,400 mg by mouth daily.   Yes Historical Provider, MD  telmisartan (MICARDIS) 80 MG tablet Take 80 mg by mouth at bedtime.    Yes Historical Provider, MD  Vitamin D, Ergocalciferol, (DRISDOL) 50000 UNITS CAPS capsule Take 50,000 Units by mouth every 7 (seven) days. Wednesdays   Yes Historical  Provider, MD  acetaminophen (TYLENOL) 500 MG tablet Take 1,000 mg by mouth every 6 (six) hours as needed for moderate pain.    Historical Provider, MD  azelastine (ASTELIN) 0.1 % nasal spray Place 1 spray into both nostrils at bedtime as needed for rhinitis. Use in each nostril as directed    Historical Provider, MD  clobetasol (TEMOVATE) 0.05 % GEL Apply 1 application topically daily as needed (itching).    Historical Provider, MD    Medications: . bisacodyl  10 mg Rectal Daily  . calcitRIOL  0.25 mcg Oral Q M,W,F  . chlorhexidine  15 mL Mouth Rinse BID  . cinacalcet  30 mg Oral Q supper  . escitalopram  5 mg Oral QHS  . famotidine  20 mg Oral Daily  . febuxostat  80 mg Oral q morning - 10a  . hydrALAZINE   25 mg Oral TID  . insulin aspart  0-9 Units Subcutaneous TID AC & HS  . insulin glargine  4 Units Subcutaneous Daily  . mouth rinse  15 mL Mouth Rinse q12n4p  . pantoprazole  40 mg Oral BID  . polyethylene glycol  17 g Oral Daily  . senna-docusate  1 tablet Oral BID   . sodium chloride Stopped (04/23/16 1502)  . dextrose 5 % and 0.45% NaCl 100 mL/hr at 05/01/16 0751     1. Whipple procedure/resection, w/ Omentum - INTRADUCTAL PAPILLARY MUCINOUS NEOPLASM WITH FOCI OF HIGH GRADE DYSPLASIA. - NO INVASIVE CARCINOMA. - EIGHT BENIGN LYMPH NODE (0/8). - INCIDENTAL BENIGN DUODENAL LYMPHANGIOMA. 2. Pancreas, biopsy, Additional pancreatic margin - PANCREATIC DUCT INVOLVED BY INTRADUCTAL PAPILLARY MUCINOUS NEOPLASM. - NO INVASIVE CARCINOMA. 3. Lymph node, biopsy, Portal - TWO BENIGN LYMPH NODES (0/2).  Assessment/Plan IPMN pancreatic head S/p Classic pancreaticoduodenectomy , Placement of pancreatic stent, Repair of umbilical hernia as part of closure 04/23/16.  Path: IMPN with high grade dysplasia. Hypertension Chronic kidney disease Sleep apnea Hx of TIA ABL anemia FEN:  IV fluids/clear liquids Hyperglycemia - consult medicine.   DVT:  SCD   Plan:  SW working on SNF placement.   Soft diet. Suppository today and add daily miralax and stool softener.   Appreciate medicine input and management of blood sugars. Change to qachs D/c telemetry D/c one JP.   LOS: 8 days    [facility] 05/01/2016

## 2016-05-02 LAB — BASIC METABOLIC PANEL
Anion gap: 8 (ref 5–15)
BUN: 10 mg/dL (ref 6–20)
CO2: 22 mmol/L (ref 22–32)
Calcium: 7.9 mg/dL — ABNORMAL LOW (ref 8.9–10.3)
Chloride: 110 mmol/L (ref 101–111)
Creatinine, Ser: 1.8 mg/dL — ABNORMAL HIGH (ref 0.44–1.00)
GFR calc Af Amer: 30 mL/min — ABNORMAL LOW (ref >60)
GFR calc non Af Amer: 26 mL/min — ABNORMAL LOW (ref >60)
Glucose, Bld: 118 mg/dL — ABNORMAL HIGH (ref 65–99)
Potassium: 2.9 mmol/L — ABNORMAL LOW (ref 3.5–5.1)
Sodium: 140 mmol/L (ref 135–145)

## 2016-05-02 LAB — GLUCOSE, CAPILLARY
GLUCOSE-CAPILLARY: 109 mg/dL — AB (ref 65–99)
Glucose-Capillary: 121 mg/dL — ABNORMAL HIGH (ref 65–99)
Glucose-Capillary: 149 mg/dL — ABNORMAL HIGH (ref 65–99)
Glucose-Capillary: 107 mg/dL — ABNORMAL HIGH (ref 65–99)

## 2016-05-02 LAB — CBC
HCT: 22.5 % — ABNORMAL LOW (ref 36.0–46.0)
Hemoglobin: 7.4 g/dL — ABNORMAL LOW (ref 12.0–15.0)
MCH: 29.2 pg (ref 26.0–34.0)
MCHC: 32.9 g/dL (ref 30.0–36.0)
MCV: 88.9 fL (ref 78.0–100.0)
Platelets: 324 10*3/uL (ref 150–400)
RBC: 2.53 MIL/uL — ABNORMAL LOW (ref 3.87–5.11)
RDW: 15.1 % (ref 11.5–15.5)
WBC: 6.8 10*3/uL (ref 4.0–10.5)

## 2016-05-02 LAB — MAGNESIUM: Magnesium: 1.3 mg/dL — ABNORMAL LOW (ref 1.7–2.4)

## 2016-05-02 MED ORDER — POTASSIUM CHLORIDE CRYS ER 20 MEQ PO TBCR
40.0000 meq | EXTENDED_RELEASE_TABLET | Freq: Two times a day (BID) | ORAL | Status: AC
Start: 2016-05-02 — End: 2016-05-02
  Administered 2016-05-02 (×2): 40 meq via ORAL
  Filled 2016-05-02 (×2): qty 2

## 2016-05-02 MED ORDER — MAGNESIUM SULFATE 4 GM/100ML IV SOLN
4.0000 g | Freq: Once | INTRAVENOUS | Status: AC
  Administered 2016-05-02: 4 g via INTRAVENOUS
  Filled 2016-05-02: qty 100

## 2016-05-02 MED ORDER — KCL IN DEXTROSE-NACL 40-5-0.45 MEQ/L-%-% IV SOLN
INTRAVENOUS | Status: DC
  Administered 2016-05-02: 10:00:00 via INTRAVENOUS
  Filled 2016-05-02: qty 1000

## 2016-05-02 MED ORDER — POTASSIUM CHLORIDE 20 MEQ/15ML (10%) PO SOLN: 40.0000 meq | Freq: Three times a day (TID) | ORAL | Status: DC

## 2016-05-02 MED ORDER — POTASSIUM CHLORIDE 20 MEQ/15ML (10%) PO SOLN
40.0000 meq | Freq: Two times a day (BID) | ORAL | Status: DC
  Administered 2016-05-02: 40 meq via ORAL
  Filled 2016-05-02: qty 30

## 2016-05-02 NOTE — Progress Notes (Signed)
Initial Nutrition Assessment  DOCUMENTATION CODES:   Not applicable  INTERVENTION:   -Snacks TID -Follow-up on 05/03/16 for calorie count results  NUTRITION DIAGNOSIS:   Increased nutrient needs related to  (post-op healing) as evidenced by estimated needs.  GOAL:   Patient will meet greater than or equal to 90% of their needs  MONITOR:   PO intake, Labs, Weight trends, Skin, I & O's  REASON FOR ASSESSMENT:   Consult Calorie Count  ASSESSMENT:   Kim Burns is an 81 y.o. female who presented to [facility] on 04/26/2016 for elective whipple procedure for intraductal papillary mucinous neoplasm in the pancreatic head. Postoperatively, she has had typical abdominal pain and slow return of appetite. There is no personal or significant family history of DM, but hyperglycemia has been noted for which TRH is consulted.   S/p PROCEDURES on 04/23/16: Classic pancreaticoduodenectomy  Placement of pancreatic stent  Repair of umbilical hernia as part of closure  Hx obtained from pt at bedside. She reports a non-existent appetite during this hospitalization, due to taste changes, which she attributes to after taste of a medication she takes daily (pt unsure of which medication). PTA, pt shares that she was following a low carb diet (3 meals per day, approximately 35 grams of carbs each meal) to assist with lowering triglycerides. Pt reports she is never usually hungry at baseline, but ensures she eats 3 meals per day. She does not like the taste of nutritional supplements, but suspects her protein intake is not adequate.   Pt believes she has lost weight over the past several months, due to following a low carb diet, however, this is not consistent with wt hx.   Nutrition-Focused physical exam completed. Findings are no fat depletion, no muscle depletion, and no edema.   Discussed importance of good meal completion to assist with healing. Pt amenable to between meal  nourishments, which RD will order. Also discussed ways to increase protein in diet with high protein foods (peanut butter, yogurt, ice cream, etc). Also reviewed low carbohydrate diet and carb counting. Encouraged pt to try to eat at consistent times during the day to assist with glucose stabilization. Pt reports she is concerned that she may have DM.   Meal completion 50-75%. Pt estimates that she consumed toast and jelly, cup of orange juice, and sprite this AM (approximately 275 kcals, 3 grams protein).   Labs reviewed: CBGS: 118-138.   Diet Order:  DIET SOFT Room service appropriate? Yes; Fluid consistency: Thin  Skin:   (closed abdominal incision)  Last BM:  05/01/16  Height:   Ht Readings from Last 1 Encounters:  04/30/16 5\' 4"  (1.626 m)    Weight:   Wt Readings from Last 1 Encounters:  04/30/16 165 lb (74.8 kg)    Ideal Body Weight:  54.5 kg  BMI:  Body mass index is 28.32 kg/m.  Estimated Nutritional Needs:   Kcal:  1650-1850  Protein:  85-100 grams  Fluid:  1.6-1.8 L  EDUCATION NEEDS:   Education needs addressed  Lamira Borin A. Jimmye Norman, RD, LDN, CDE Pager: [phone] After hours Pager: [phone]

## 2016-05-02 NOTE — Progress Notes (Signed)
9 Days Post-Op  Subjective: Had a BM.  Feeling a bit better, but tired.  No n/v.  Slightly less reflux.    Objective: Vital signs in last 24 hours: Temp:  [97.6 F (36.4 C)-97.8 F (36.6 C)] 97.7 F (36.5 C) (03/15 0500) Pulse Rate:  [68-72] 72 (03/15 0500) Resp:  [17-18] 17 (03/15 0500) BP: (136-152)/(64-70) 148/70 (03/15 0500) SpO2:  [98 %-100 %] 98 % (03/15 0500) Last BM Date: 05/01/16  Intake/Output from previous day: 03/14 0701 - 03/15 0700 In: 3459.1 [P.O.:1720; I.V.:1739.1] Out: 2035 [Urine:1925; Drains:110] Intake/Output this shift: No intake/output data recorded.  General appearance: alert, cooperative and no distress Resp: breathing comfortably GI: soft, moderately distended, but less so.  approp tender.  Drains serosang.    Lab Results:   Recent Labs  04/30/16 0217 05/02/16 0509  WBC 7.6 6.8  HGB 7.4* 7.4*  HCT 22.8* 22.5*  PLT 304 324    BMET  Recent Labs  04/30/16 0217 05/02/16 0509  NA 139 140  K 3.0* 2.9*  CL 108 110  CO2 23 22  GLUCOSE 155* 118*  BUN 14 10  CREATININE 1.74* 1.80*  CALCIUM 8.5* 7.9*   PT/INR No results for input(s): LABPROT, INR in the last 72 hours.   Recent Labs Lab 04/26/16 0425 04/27/16 0220 04/28/16 0421  AST 36 26 21  ALT 26 22 21   ALKPHOS 18* 23* 28*  BILITOT 0.4 0.5 0.6  PROT 5.0* 5.4* 5.8*  ALBUMIN 2.7* 2.7* 2.5*     Lipase     Component Value Date/Time   LIPASE 35 06/18/2013 1435     Studies/Results: No results found. Prior to Admission medications   Medication Sig Start Date End Date Taking? Authorizing Provider  Artificial Tear Solution (GENTEAL TEARS OP) Apply 1 drop to eye 3 (three) times daily as needed (dry eyes).    Yes Historical Provider, MD  calcitRIOL (ROCALTROL) 0.25 MCG capsule Take 0.25 mcg by mouth every Monday, Wednesday, and Friday. At night   Yes Historical Provider, MD  cinacalcet (SENSIPAR) 30 MG tablet Take 30 mg by mouth daily with supper.   Yes Historical Provider, MD   clopidogrel (PLAVIX) 75 MG tablet Take 75 mg by mouth every morning.   Yes Historical Provider, MD  diclofenac sodium (VOLTAREN) 1 % GEL Apply 2 g topically 4 (four) times daily as needed (hand pain).    Yes Historical Provider, MD  diphenhydramine-acetaminophen (TYLENOL PM) 25-500 MG TABS Take 2 tablets by mouth at bedtime as needed (Sleep).    Yes Historical Provider, MD  escitalopram (LEXAPRO) 5 MG tablet Take 5 mg by mouth at bedtime.   Yes Historical Provider, MD  Febuxostat (ULORIC) 80 MG TABS Take 80 mg by mouth every morning.    Yes Historical Provider, MD  fenofibrate (TRICOR) 145 MG tablet Take 145 mg by mouth every morning.    Yes Historical Provider, MD  hydrALAZINE (APRESOLINE) 25 MG tablet Take 25 mg by mouth 3 (three) times daily.   Yes Historical Provider, MD  Omega-3 Fatty Acids (FISH OIL) 1200 MG CAPS Take 2,400 mg by mouth daily.   Yes Historical Provider, MD  telmisartan (MICARDIS) 80 MG tablet Take 80 mg by mouth at bedtime.    Yes Historical Provider, MD  Vitamin D, Ergocalciferol, (DRISDOL) 50000 UNITS CAPS capsule Take 50,000 Units by mouth every 7 (seven) days. Wednesdays   Yes Historical Provider, MD  acetaminophen (TYLENOL) 500 MG tablet Take 1,000 mg by mouth every 6 (six) hours as  needed for moderate pain.    Historical Provider, MD  azelastine (ASTELIN) 0.1 % nasal spray Place 1 spray into both nostrils at bedtime as needed for rhinitis. Use in each nostril as directed    Historical Provider, MD  clobetasol (TEMOVATE) 0.05 % GEL Apply 1 application topically daily as needed (itching).    Historical Provider, MD    Medications: . bisacodyl  10 mg Rectal Daily  . calcitRIOL  0.25 mcg Oral Q M,W,F  . chlorhexidine  15 mL Mouth Rinse BID  . cinacalcet  30 mg Oral Q supper  . escitalopram  5 mg Oral QHS  . famotidine  20 mg Oral Daily  . febuxostat  80 mg Oral q morning - 10a  . hydrALAZINE  25 mg Oral TID  . insulin aspart  0-9 Units Subcutaneous TID AC & HS  .  insulin glargine  4 Units Subcutaneous Daily  . mouth rinse  15 mL Mouth Rinse q12n4p  . pantoprazole  40 mg Oral BID  . polyethylene glycol  17 g Oral Daily  . potassium chloride  40 mEq Oral BID  . senna-docusate  1 tablet Oral BID   . sodium chloride Stopped (04/23/16 1502)  . dextrose 5 % and 0.45 % NaCl with KCl 40 mEq/L 75 mL/hr at 05/02/16 1027     1. Whipple procedure/resection, w/ Omentum - INTRADUCTAL PAPILLARY MUCINOUS NEOPLASM WITH FOCI OF HIGH GRADE DYSPLASIA. - NO INVASIVE CARCINOMA. - EIGHT BENIGN LYMPH NODE (0/8). - INCIDENTAL BENIGN DUODENAL LYMPHANGIOMA. 2. Pancreas, biopsy, Additional pancreatic margin - PANCREATIC DUCT INVOLVED BY INTRADUCTAL PAPILLARY MUCINOUS NEOPLASM. - NO INVASIVE CARCINOMA. 3. Lymph node, biopsy, Portal - TWO BENIGN LYMPH NODES (0/2).  Assessment/Plan IPMN pancreatic head S/p Classic pancreaticoduodenectomy , Placement of pancreatic stent, Repair of umbilical hernia as part of closure 04/23/16.  Path: IMPN with high grade dysplasia. Hypertension Chronic kidney disease Sleep apnea Hx of TIA ABL anemia FEN:  IV fluids/clear liquids Hyperglycemia - consult medicine.   DVT:  SCD   Plan:  SW working on SNF placement.   Soft diet.  Appreciate medicine input and management of blood sugars. Change to qachs Calorie count.  Anticipate d/c in next few days when bed available at SNF.     LOS: 9 days    [facility] 05/02/2016

## 2016-05-02 NOTE — Progress Notes (Signed)
PROGRESS NOTE    Kim Burns  NWG:[id number] DOB: 05/28/1935 DOA: 04/23/2016 PCP: Rachell Cipro, MD   Outpatient Specialists: General surgery Neurology Nephrology  Requesting physician: Dr Barry Dienes - general surgery  Reason for consultation: Hyperglycemia  Brief Narrative:  81 y.o.femalewho presented to [facility] on 04/26/2016 for elective whipple procedure for intraductal papillary mucinous neoplasm in the pancreatic head. Postoperatively, she has had typical abdominal pain and slow return of appetite. There is no personal or significant family history of DM, but hyperglycemia has been noted for which TRH is consulted.   Assessment & Plan:   Active Problems:   CKD (chronic kidney disease) stage 3, GFR 30-59 ml/min   IPMN (intraductal papillary mucinous neoplasm)   GERD (gastroesophageal reflux disease)   Hyperglycemia  Hyperglycemia: Mild. Stress-induced and pancreatic resection likely unmasked diminished beta cell reserve. She is at inpatient goal < 180mg /dl. No known h/o DM.  - CBG over past 24 hours with peak of 149. Patient admits to liberal diet consisting of sugary foods - most recent HbA1c 5.4 - Insulin levels within normal limits - Glyburide started 3/12 as substitution for lantus > currently on lantus qHS (got glyburide) and continue to adjust dose as necessary based on SSI requirements.  - Nutrition has seen the patient  - Remains stable  IPMN: s/p whipple on 04/26/16. - Per primary team, CCS  - remains stable  CKD, stage III - IV: Creatinine at presumed baseline ~1.6-2.0. Improved from pre-op.  - Tolerating PO intake - renal function near baseline  Reflux esophagitis: worse since surgery.  - Continue PPI, pepcid and GI cocktails prn - Currently stable  Hypokalemia - Did not tolerate liquid KCl this AM - Will replace Mg - Will give additional 19meq x 2 doses after replacing Mg - Repeat bmet in AM  Hypomagnesemia - Will replace -  Repeat level in AM  Antimicrobials: Anti-infectives    Start     Dose/Rate Route Frequency Ordered Stop   04/23/16 1830  ceFAZolin (ANCEF) IVPB 2g/100 mL premix     2 g 200 mL/hr over 30 Minutes Intravenous Every 8 hours 04/23/16 1701 04/23/16 1828   04/23/16 0747  ceFAZolin (ANCEF) IVPB 2g/100 mL premix     2 g 200 mL/hr over 30 Minutes Intravenous On call to O.R. 04/23/16 0747 04/23/16 1027      Subjective: No complaints this AM  Objective: Vitals:   05/01/16 0443 05/01/16 1419 05/01/16 2036 05/02/16 0500  BP: (!) 145/66 (!) 152/68 136/64 (!) 148/70  Pulse: 88 69 68 72  Resp: 19 18 17 17   Temp: 98.1 F (36.7 C) 97.6 F (36.4 C) 97.8 F (36.6 C) 97.7 F (36.5 C)  TempSrc: Oral Oral Oral Oral  SpO2: 100% 100% 99% 98%  Weight:      Height:        Intake/Output Summary (Last 24 hours) at 05/02/16 1420 Last data filed at 05/02/16 1211  Gross per 24 hour  Intake          2879.08 ml  Output             2445 ml  Net           434.08 ml   Filed Weights   04/26/16 1356 04/30/16 2056  Weight: 69 kg (152 lb 1.9 oz) 74.8 kg (165 lb)    Examination:  General exam: Laying in bed, awake, in nad Respiratory system: normal resp effort, no audible wheezing Cardiovascular system: regular rate, s1-2 Gastrointestinal system:  soft, nondistended, pos BS Central nervous system: cn2-12 grossly intact, strength intact Extremities: perfused, no clubbing Skin: normal skin turgor, no notable skin lesions seen Psychiatry: mood normal// no visual hallucinations  Data Reviewed: I have personally reviewed following labs and imaging studies  CBC:  Recent Labs Lab 04/26/16 0425 04/27/16 0220 04/28/16 0421 04/30/16 0217 05/02/16 0509  WBC 9.0 8.0 6.3 7.6 6.8  HGB 7.1* 7.3* 7.3* 7.4* 7.4*  HCT 22.1* 22.6* 22.9* 22.8* 22.5*  MCV 92.1 90.4 89.5 88.7 88.9  PLT 202 239 249 304 093   Basic Metabolic Panel:  Recent Labs Lab 04/26/16 0425 04/27/16 0220 04/28/16 0421  04/30/16 0217 05/02/16 0509  NA 140 136 139 139 140  K 3.9 3.5 3.4* 3.0* 2.9*  CL 112* 106 108 108 110  CO2 22 22 24 23 22   GLUCOSE 153* 148* 143* 155* 118*  BUN 31* 22* 16 14 10   CREATININE 2.11* 1.75* 1.65* 1.74* 1.80*  CALCIUM 8.6* 8.8* 8.8* 8.5* 7.9*  MG  --   --   --   --  1.3*   GFR: Estimated Creatinine Clearance: 25.5 mL/min (A) (by C-G formula based on SCr of 1.8 mg/dL (H)). Liver Function Tests:  Recent Labs Lab 04/26/16 0425 04/27/16 0220 04/28/16 0421  AST 36 26 21  ALT 26 22 21   ALKPHOS 18* 23* 28*  BILITOT 0.4 0.5 0.6  PROT 5.0* 5.4* 5.8*  ALBUMIN 2.7* 2.7* 2.5*   No results for input(s): LIPASE, AMYLASE in the last 168 hours. No results for input(s): AMMONIA in the last 168 hours. Coagulation Profile: No results for input(s): INR, PROTIME in the last 168 hours. Cardiac Enzymes: No results for input(s): CKTOTAL, CKMB, CKMBINDEX, TROPONINI in the last 168 hours. BNP (last 3 results) No results for input(s): PROBNP in the last 8760 hours. HbA1C:  Recent Labs  04/30/16 0842  HGBA1C 5.4   CBG:  Recent Labs Lab 05/01/16 1145 05/01/16 1758 05/01/16 2041 05/02/16 0733 05/02/16 1129  GLUCAP 138* 118* 133* 121* 149*   Lipid Profile: No results for input(s): CHOL, HDL, LDLCALC, TRIG, CHOLHDL, LDLDIRECT in the last 72 hours. Thyroid Function Tests: No results for input(s): TSH, T4TOTAL, FREET4, T3FREE, THYROIDAB in the last 72 hours. Anemia Panel: No results for input(s): VITAMINB12, FOLATE, FERRITIN, TIBC, IRON, RETICCTPCT in the last 72 hours. Sepsis Labs: No results for input(s): PROCALCITON, LATICACIDVEN in the last 168 hours.  Recent Results (from the past 240 hour(s))  MRSA PCR Screening     Status: None   Collection Time: 04/24/16  8:17 AM  Result Value Ref Range Status   MRSA by PCR NEGATIVE NEGATIVE Final    Comment:        The GeneXpert MRSA Assay (FDA approved for NASAL specimens only), is one component of a comprehensive MRSA  colonization surveillance program. It is not intended to diagnose MRSA infection nor to guide or monitor treatment for MRSA infections.      Radiology Studies: No results found.  Scheduled Meds: . bisacodyl  10 mg Rectal Daily  . calcitRIOL  0.25 mcg Oral Q M,W,F  . chlorhexidine  15 mL Mouth Rinse BID  . cinacalcet  30 mg Oral Q supper  . escitalopram  5 mg Oral QHS  . famotidine  20 mg Oral Daily  . febuxostat  80 mg Oral q morning - 10a  . hydrALAZINE  25 mg Oral TID  . insulin aspart  0-9 Units Subcutaneous TID AC & HS  . insulin glargine  4 Units Subcutaneous Daily  . magnesium sulfate 1 - 4 g bolus IVPB  4 g Intravenous Once  . mouth rinse  15 mL Mouth Rinse q12n4p  . pantoprazole  40 mg Oral BID  . polyethylene glycol  17 g Oral Daily  . potassium chloride  40 mEq Oral BID  . senna-docusate  1 tablet Oral BID   Continuous Infusions: . sodium chloride Stopped (04/23/16 1502)     LOS: 9 days   CHIU, Orpah Melter, MD [facility] Pager [phone]  If 7PM-7AM, please contact night-coverage [email or web address] Password [email or web address] 05/02/2016, 2:20 PM

## 2016-05-03 LAB — GLUCOSE, CAPILLARY
GLUCOSE-CAPILLARY: 113 mg/dL — AB (ref 65–99)
GLUCOSE-CAPILLARY: 108 mg/dL — AB (ref 65–99)
GLUCOSE-CAPILLARY: 130 mg/dL — AB (ref 65–99)
Glucose-Capillary: 121 mg/dL — ABNORMAL HIGH (ref 65–99)

## 2016-05-03 LAB — BASIC METABOLIC PANEL
Anion gap: 10 (ref 5–15)
BUN: 8 mg/dL (ref 6–20)
CHLORIDE: 107 mmol/L (ref 101–111)
CO2: 24 mmol/L (ref 22–32)
Calcium: 8.2 mg/dL — ABNORMAL LOW (ref 8.9–10.3)
Creatinine, Ser: 1.68 mg/dL — ABNORMAL HIGH (ref 0.44–1.00)
GFR calc non Af Amer: 28 mL/min — ABNORMAL LOW (ref >60)
GFR, EST AFRICAN AMERICAN: 33 mL/min — AB (ref >60)
Glucose, Bld: 117 mg/dL — ABNORMAL HIGH (ref 65–99)
POTASSIUM: 4 mmol/L (ref 3.5–5.1)
SODIUM: 141 mmol/L (ref 135–145)

## 2016-05-03 LAB — MAGNESIUM: MAGNESIUM: 2 mg/dL (ref 1.7–2.4)

## 2016-05-03 MED ORDER — ONDANSETRON 4 MG PO TBDP: 4.0000 mg | ORAL_TABLET | Freq: Four times a day (QID) | ORAL | 0 refills | Status: DC | PRN

## 2016-05-03 MED ORDER — ADULT MULTIVITAMIN W/MINERALS CH
1.0000 | ORAL_TABLET | Freq: Every day | ORAL | Status: DC
  Administered 2016-05-06: 1 via ORAL
  Filled 2016-05-03 (×2): qty 1

## 2016-05-03 MED ORDER — POLYETHYLENE GLYCOL 3350 17 G PO PACK: 17.0000 g | PACK | Freq: Every day | ORAL | 12 refills | Status: DC

## 2016-05-03 MED ORDER — PANTOPRAZOLE SODIUM 40 MG PO TBEC: 40.0000 mg | DELAYED_RELEASE_TABLET | Freq: Two times a day (BID) | ORAL | 4 refills | Status: DC

## 2016-05-03 MED ORDER — PRO-STAT SUGAR FREE PO LIQD
30.0000 mL | Freq: Three times a day (TID) | ORAL | Status: DC
  Filled 2016-05-03 (×6): qty 30

## 2016-05-03 MED ORDER — GI COCKTAIL ~~LOC~~: 30.0000 mL | Freq: Three times a day (TID) | ORAL | 1 refills | Status: DC | PRN

## 2016-05-03 MED ORDER — BISACODYL 10 MG RE SUPP: 10.0000 mg | Freq: Every day | RECTAL | 0 refills | Status: DC

## 2016-05-03 MED ORDER — TRAMADOL HCL 50 MG PO TABS
100.0000 mg | ORAL_TABLET | Freq: Two times a day (BID) | ORAL | 1 refills | Status: DC | PRN
Start: 2016-05-03 — End: 2016-11-12

## 2016-05-03 NOTE — Progress Notes (Signed)
Calorie Count Note  48 hour calorie count ordered.  Diet: carb modified Supplements: snacks TID  Calorie count data incomplete (no data available). Meal estimates gathered via pt interview, meal completion percentages, and nutrition information from meal ordering system.   Day 1 Breakfast: 384 kcals, 5 grams protein Lunch: 141 kcals, 8 grams protein Dinner: 547 kcals, 22 grams protein Supplements: n/a  Total intake: 1072 kcal (65% of minimum estimated needs)  36 protein (42% of minimum estimated needs)  Day 2 Breakfast: 275 kcals, 3 grams protein Lunch: 196 kcals, 10 grams protein Dinner: 186 kcals, 2 grams protein Supplements: n/a  Total intake: 657 kcal (32% of minimum estimated needs)  15 protein (18% of minimum estimated needs)  Average Total intake: 865 kcal (48% of minimum estimated needs)  26 protein (30% of minimum estimated needs)  Nutrition Dx: Increased nutrient needs related to  (post-op healing) as evidenced by estimated needs; ongoing  Goal: Patient will meet greater than or equal to 90% of their needs  Intervention:  -Snacks TID -MVI daily -30 ml Prostat TID, each supplement provides 100 kcals and 15 grams protein  Dink Creps A. Jimmye Norman, RD, LDN, CDE Pager: [phone] After hours Pager: [phone]

## 2016-05-03 NOTE — Progress Notes (Signed)
PROGRESS NOTE    Kim Burns  FXT:[id number] DOB: [birth year] DOA: 04/23/2016 PCP: Rachell Cipro, MD   Outpatient Specialists: General surgery Neurology Nephrology  Requesting physician: Dr Barry Dienes - general surgery  Reason for consultation: Hyperglycemia  Brief Narrative:  81 y.o.femalewho presented to [facility] on 04/26/2016 for elective whipple procedure for intraductal papillary mucinous neoplasm in the pancreatic head. Postoperatively, she has had typical abdominal pain and slow return of appetite. There is no personal or significant family history of DM, but hyperglycemia has been noted for which TRH is consulted.   Assessment & Plan:   Active Problems:   CKD (chronic kidney disease) stage 3, GFR 30-59 ml/min   IPMN (intraductal papillary mucinous neoplasm)   GERD (gastroesophageal reflux disease)   Hyperglycemia  Hyperglycemia: Mild. Stress-induced and pancreatic resection likely unmasked diminished beta cell reserve. She is at inpatient goal < 180mg /dl. No known h/o DM.  - most recent HbA1c 5.4 - Patient reports relatively diet controlled diabetes prior to admission with peak post-prandial glucose of 170. Given above A1c, would recommend no agents at this time on discharge and to have pt follow up closely with PCP. Would have patient monitor glucose intermittently at home.   IPMN: s/p whipple on 04/26/16. - Per primary team, CCS  - remains stable  CKD, stage III - IV: Creatinine at presumed baseline ~1.6-2.0. Improved from pre-op.  - Tolerating PO intake - renal function near baseline  Reflux esophagitis: worse since surgery.  - Continue PPI, pepcid and GI cocktails prn - Currently stable  Hypokalemia - Resolved -Labs reviewed -Repeat bmet in AM  Hypomagnesemia - Replaced -Labs reviewed  Antimicrobials: Anti-infectives    Start     Dose/Rate Route Frequency Ordered Stop   04/23/16 1830  ceFAZolin (ANCEF) IVPB 2g/100 mL premix     2  g 200 mL/hr over 30 Minutes Intravenous Every 8 hours 04/23/16 1701 04/23/16 1828   04/23/16 0747  ceFAZolin (ANCEF) IVPB 2g/100 mL premix     2 g 200 mL/hr over 30 Minutes Intravenous On call to O.R. 04/23/16 0747 04/23/16 1027      Subjective: Without complaints  Objective: Vitals:   05/02/16 1537 05/02/16 2037 05/03/16 0448 05/03/16 1245  BP: (!) 166/80 (!) 167/74 (!) 146/74 (!) 161/72  Pulse: 71 72 71 73  Resp: 14 16 16 17   Temp: 98.2 F (36.8 C) 97.6 F (36.4 C) 98.2 F (36.8 C) 98.2 F (36.8 C)  TempSrc: Oral Oral Oral Oral  SpO2: 99% 100% 97% 99%  Weight:      Height:        Intake/Output Summary (Last 24 hours) at 05/03/16 1601 Last data filed at 05/03/16 0856  Gross per 24 hour  Intake              420 ml  Output               50 ml  Net              370 ml   Filed Weights   04/26/16 1356 04/30/16 2056  Weight: 69 kg (152 lb 1.9 oz) 74.8 kg (165 lb)    Examination:  General exam: Sitting in chair, awake, conversant Respiratory system: normal chest rise, no wheezing Cardiovascular system: regular rhythm, s1, s2 on auscultation Gastrointestinal system: nondistended, nontender, pos BS Central nervous system: no seizures, no tremors Extremities: no cyanosis, no joint deformities Skin: no rashes, no pallor Psychiatry: affect normal// no auditory hallucinations  Data Reviewed:  I have personally reviewed following labs and imaging studies  CBC:  Recent Labs Lab 04/27/16 0220 04/28/16 0421 04/30/16 0217 05/02/16 0509  WBC 8.0 6.3 7.6 6.8  HGB 7.3* 7.3* 7.4* 7.4*  HCT 22.6* 22.9* 22.8* 22.5*  MCV 90.4 89.5 88.7 88.9  PLT 239 249 304 423   Basic Metabolic Panel:  Recent Labs Lab 04/27/16 0220 04/28/16 0421 04/30/16 0217 05/02/16 0509 05/03/16 0537  NA 136 139 139 140 141  K 3.5 3.4* 3.0* 2.9* 4.0  CL 106 108 108 110 107  CO2 22 24 23 22 24   GLUCOSE 148* 143* 155* 118* 117*  BUN 22* 16 14 10 8   CREATININE 1.75* 1.65* 1.74* 1.80* 1.68*    CALCIUM 8.8* 8.8* 8.5* 7.9* 8.2*  MG  --   --   --  1.3* 2.0   GFR: Estimated Creatinine Clearance: 27.3 mL/min (A) (by C-G formula based on SCr of 1.68 mg/dL (H)). Liver Function Tests:  Recent Labs Lab 04/27/16 0220 04/28/16 0421  AST 26 21  ALT 22 21  ALKPHOS 23* 28*  BILITOT 0.5 0.6  PROT 5.4* 5.8*  ALBUMIN 2.7* 2.5*   No results for input(s): LIPASE, AMYLASE in the last 168 hours. No results for input(s): AMMONIA in the last 168 hours. Coagulation Profile: No results for input(s): INR, PROTIME in the last 168 hours. Cardiac Enzymes: No results for input(s): CKTOTAL, CKMB, CKMBINDEX, TROPONINI in the last 168 hours. BNP (last 3 results) No results for input(s): PROBNP in the last 8760 hours. HbA1C: No results for input(s): HGBA1C in the last 72 hours. CBG:  Recent Labs Lab 05/02/16 1129 05/02/16 1636 05/02/16 2111 05/03/16 0801 05/03/16 1207  GLUCAP 149* 109* 107* 113* 121*   Lipid Profile: No results for input(s): CHOL, HDL, LDLCALC, TRIG, CHOLHDL, LDLDIRECT in the last 72 hours. Thyroid Function Tests: No results for input(s): TSH, T4TOTAL, FREET4, T3FREE, THYROIDAB in the last 72 hours. Anemia Panel: No results for input(s): VITAMINB12, FOLATE, FERRITIN, TIBC, IRON, RETICCTPCT in the last 72 hours. Sepsis Labs: No results for input(s): PROCALCITON, LATICACIDVEN in the last 168 hours.  Recent Results (from the past 240 hour(s))  MRSA PCR Screening     Status: None   Collection Time: 04/24/16  8:17 AM  Result Value Ref Range Status   MRSA by PCR NEGATIVE NEGATIVE Final    Comment:        The GeneXpert MRSA Assay (FDA approved for NASAL specimens only), is one component of a comprehensive MRSA colonization surveillance program. It is not intended to diagnose MRSA infection nor to guide or monitor treatment for MRSA infections.      Radiology Studies: No results found.  Scheduled Meds: . bisacodyl  10 mg Rectal Daily  . calcitRIOL  0.25 mcg  Oral Q M,W,F  . chlorhexidine  15 mL Mouth Rinse BID  . cinacalcet  30 mg Oral Q supper  . escitalopram  5 mg Oral QHS  . famotidine  20 mg Oral Daily  . febuxostat  80 mg Oral q morning - 10a  . feeding supplement (PRO-STAT SUGAR FREE 64)  30 mL Oral TID  . hydrALAZINE  25 mg Oral TID  . insulin aspart  0-9 Units Subcutaneous TID AC & HS  . insulin glargine  4 Units Subcutaneous Daily  . mouth rinse  15 mL Mouth Rinse q12n4p  . multivitamin with minerals  1 tablet Oral Daily  . pantoprazole  40 mg Oral BID  . polyethylene glycol  17  g Oral Daily  . senna-docusate  1 tablet Oral BID   Continuous Infusions: . sodium chloride Stopped (04/23/16 1502)     LOS: 10 days   Holli Rengel, Orpah Melter, MD [facility] Pager [phone]  If 7PM-7AM, please contact night-coverage [email or web address] Password [email or web address] 05/03/2016, 4:01 PM

## 2016-05-03 NOTE — Care Management Important Message (Signed)
Important Message  Patient Details  Name: Kim Burns MRN: [id number] Date of Birth: December 19, 1935   Medicare Important Message Given:  Yes    [name] 05/03/2016, 2:42 PM

## 2016-05-03 NOTE — Progress Notes (Signed)
10 Days Post-Op  Subjective: Still low intake, but slightly improved.  Getting full quickly.  No n/v.    Objective: Vital signs in last 24 hours: Temp:  [97.6 F (36.4 C)-98.2 F (36.8 C)] 98.2 F (36.8 C) (03/16 0448) Pulse Rate:  [71-72] 71 (03/16 0448) Resp:  [14-16] 16 (03/16 0448) BP: (146-167)/(74-80) 146/74 (03/16 0448) SpO2:  [97 %-100 %] 97 % (03/16 0448) Last BM Date: 05/02/16  Intake/Output from previous day: 03/15 0701 - 03/16 0700 In: 840 [P.O.:840] Out: 960 [Urine:850; Drains:110] Intake/Output this shift: Total I/O In: 60 [P.O.:60] Out: -   General appearance: alert, cooperative and no distress Resp: breathing comfortably GI: soft, non distended.  approp tender.  Drain serosang.    Lab Results:   Recent Labs  05/02/16 0509  WBC 6.8  HGB 7.4*  HCT 22.5*  PLT 324    BMET  Recent Labs  05/02/16 0509 05/03/16 0537  NA 140 141  K 2.9* 4.0  CL 110 107  CO2 22 24  GLUCOSE 118* 117*  BUN 10 8  CREATININE 1.80* 1.68*  CALCIUM 7.9* 8.2*   PT/INR No results for input(s): LABPROT, INR in the last 72 hours.   Recent Labs Lab 04/27/16 0220 04/28/16 0421  AST 26 21  ALT 22 21  ALKPHOS 23* 28*  BILITOT 0.5 0.6  PROT 5.4* 5.8*  ALBUMIN 2.7* 2.5*     Lipase     Component Value Date/Time   LIPASE 35 06/18/2013 1435     Studies/Results: No results found. Prior to Admission medications   Medication Sig Start Date End Date Taking? Authorizing Provider  Artificial Tear Solution (GENTEAL TEARS OP) Apply 1 drop to eye 3 (three) times daily as needed (dry eyes).    Yes Historical Provider, MD  calcitRIOL (ROCALTROL) 0.25 MCG capsule Take 0.25 mcg by mouth every Monday, Wednesday, and Friday. At night   Yes Historical Provider, MD  cinacalcet (SENSIPAR) 30 MG tablet Take 30 mg by mouth daily with supper.   Yes Historical Provider, MD  clopidogrel (PLAVIX) 75 MG tablet Take 75 mg by mouth every morning.   Yes Historical Provider, MD  diclofenac  sodium (VOLTAREN) 1 % GEL Apply 2 g topically 4 (four) times daily as needed (hand pain).    Yes Historical Provider, MD  diphenhydramine-acetaminophen (TYLENOL PM) 25-500 MG TABS Take 2 tablets by mouth at bedtime as needed (Sleep).    Yes Historical Provider, MD  escitalopram (LEXAPRO) 5 MG tablet Take 5 mg by mouth at bedtime.   Yes Historical Provider, MD  Febuxostat (ULORIC) 80 MG TABS Take 80 mg by mouth every morning.    Yes Historical Provider, MD  fenofibrate (TRICOR) 145 MG tablet Take 145 mg by mouth every morning.    Yes Historical Provider, MD  hydrALAZINE (APRESOLINE) 25 MG tablet Take 25 mg by mouth 3 (three) times daily.   Yes Historical Provider, MD  Omega-3 Fatty Acids (FISH OIL) 1200 MG CAPS Take 2,400 mg by mouth daily.   Yes Historical Provider, MD  telmisartan (MICARDIS) 80 MG tablet Take 80 mg by mouth at bedtime.    Yes Historical Provider, MD  Vitamin D, Ergocalciferol, (DRISDOL) 50000 UNITS CAPS capsule Take 50,000 Units by mouth every 7 (seven) days. Wednesdays   Yes Historical Provider, MD  acetaminophen (TYLENOL) 500 MG tablet Take 1,000 mg by mouth every 6 (six) hours as needed for moderate pain.    Historical Provider, MD  azelastine (ASTELIN) 0.1 % nasal spray Place  1 spray into both nostrils at bedtime as needed for rhinitis. Use in each nostril as directed    Historical Provider, MD  clobetasol (TEMOVATE) 0.05 % GEL Apply 1 application topically daily as needed (itching).    Historical Provider, MD    Medications: . bisacodyl  10 mg Rectal Daily  . calcitRIOL  0.25 mcg Oral Q M,W,F  . chlorhexidine  15 mL Mouth Rinse BID  . cinacalcet  30 mg Oral Q supper  . escitalopram  5 mg Oral QHS  . famotidine  20 mg Oral Daily  . febuxostat  80 mg Oral q morning - 10a  . hydrALAZINE  25 mg Oral TID  . insulin aspart  0-9 Units Subcutaneous TID AC & HS  . insulin glargine  4 Units Subcutaneous Daily  . mouth rinse  15 mL Mouth Rinse q12n4p  . pantoprazole  40 mg Oral  BID  . polyethylene glycol  17 g Oral Daily  . senna-docusate  1 tablet Oral BID   . sodium chloride Stopped (04/23/16 1502)     1. Whipple procedure/resection, w/ Omentum - INTRADUCTAL PAPILLARY MUCINOUS NEOPLASM WITH FOCI OF HIGH GRADE DYSPLASIA. - NO INVASIVE CARCINOMA. - EIGHT BENIGN LYMPH NODE (0/8). - INCIDENTAL BENIGN DUODENAL LYMPHANGIOMA. 2. Pancreas, biopsy, Additional pancreatic margin - PANCREATIC DUCT INVOLVED BY INTRADUCTAL PAPILLARY MUCINOUS NEOPLASM. - NO INVASIVE CARCINOMA. 3. Lymph node, biopsy, Portal - TWO BENIGN LYMPH NODES (0/2).  Assessment/Plan IPMN pancreatic head S/p Classic pancreaticoduodenectomy , Placement of pancreatic stent, Repair of umbilical hernia as part of closure 04/23/16.  Path: IMPN with high grade dysplasia. Hypertension Chronic kidney disease Sleep apnea Hx of TIA ABL anemia FEN:  Saline lock.  Hyperglycemia - consult medicine.   DVT:  SCD   Plan:   Continue calorie count.  Diet as tolerated in terms of consistency.   Tentative plan for d/c tomorrow to SNF.   SW contacting facility to make sure bed available.  Appreciate medicine input for DM treatment.      LOS: 10 days    Milanya Sunderland 05/03/2016

## 2016-05-03 NOTE — Progress Notes (Signed)
RN removed staples per order. Steri strips placed.

## 2016-05-03 NOTE — Progress Notes (Signed)
Patient vomited large amount of green emesis with undigested food particles. Will monitor.

## 2016-05-03 NOTE — Clinical Social Work Note (Signed)
CSW spoke with pt at bedside and son-Mike Friend (via speakerphone). CSW siscussed MDs recommendation for d/c on Saturday. Pt choice is [facility] and bed offer is confirmed. CSW encouraged pt and son to check with insurance for specific policy needs. Pt would like son to transport her tomorrow to [facility]. Pt asked about [facility] and CSW informed pt, after confirming with CM, that they do not accept weekend admissions. Pt in agreement with [facility]. CSW will continue to follow for d/c needs.   Oretha Ellis, [place], [place] Work [phone]

## 2016-05-03 NOTE — Progress Notes (Signed)
Physical Therapy Treatment Patient Details Name: Kim Burns MRN: [id number] DOB: 09/21/1935 Today's Date: 05/03/2016    History of Present Illness Pt is a 81 y/o female s/p whipple procedure secondary to pancreatic mass. PMH including but not limited to CKD, HTN and hx of CVA    PT Comments    Patient is making good progress with PT.  Pt is min guard for transfers and gait mainly due to pt reported dizziness with positional change. Pt has not experienced any LoB with PT. Pt able to ambulate 1x150 feet and 1x100 feet today. Pt requires skilled PT for continued gait training and to improve endurance and strength to be safe in her environment.      Follow Up Recommendations  Supervision/Assistance - 24 hour;CIR     Equipment Recommendations  Rolling walker with 5" wheels    Recommendations for Other Services       Precautions / Restrictions Precautions Precautions: Fall Precaution Comments: abdominal drains x 2 Restrictions Weight Bearing Restrictions: No    Mobility  Bed Mobility Overal bed mobility: Modified Independent Bed Mobility: Sit to Supine     Supine to sit: Supervision     General bed mobility comments: pt easily moved her LE into the bed and squared herself up  Transfers Overall transfer level: Needs assistance Equipment used: Rolling walker (2 wheeled) Transfers: Sit to/from Stand Sit to Stand: Min guard         General transfer comment: min guard mainly due to complaints of dizziness with position change  Ambulation/Gait Ambulation/Gait assistance: Min guard Ambulation Distance (Feet): 250 Feet (1x150 1x100) Assistive device: Rolling walker (2 wheeled) Gait Pattern/deviations: Step-through pattern;Decreased stride length Gait velocity: decreased Gait velocity interpretation: Below normal speed for age/gender General Gait Details: pt has slow, steady gait with no LoB, no buckling, required seated rest break due to fatigue and SoB       Balance Overall balance assessment: Needs assistance Sitting-balance support: Feet supported Sitting balance-Leahy Scale: Fair     Standing balance support: Bilateral upper extremity supported Standing balance-Leahy Scale: Fair                      Cognition Arousal/Alertness: Awake/alert Behavior During Therapy: WFL for tasks assessed/performed Overall Cognitive Status: Within Functional Limits for tasks assessed                         General Comments General comments (skin integrity, edema, etc.): Pt became SoB with ambulation requiring seated rest break at 150 feet due to SoB SaO2 on RA 93% and HR 88 bpm, SoB with ambulating 100 feet back to bed SaO2 94% on RA and HR 89 bpm      Pertinent Vitals/Pain Pain Assessment: Faces Faces Pain Scale: No hurt           PT Goals (current goals can now be found in the care plan section) Acute Rehab PT Goals Patient Stated Goal: go to rehab to get stronger PT Goal Formulation: With patient Time For Goal Achievement: 05/11/16 Potential to Achieve Goals: Good Progress towards PT goals: Progressing toward goals    Frequency    Min 3X/week      PT Plan Discharge plan needs to be updated    Co-evaluation             End of Session Equipment Utilized During Treatment: Gait belt Activity Tolerance: Patient limited by fatigue Patient left: in chair;with call bell/phone within  reach Nurse Communication: Mobility status PT Visit Diagnosis: Other abnormalities of gait and mobility (R26.89);Pain;Dizziness and giddiness (R42) Pain - part of body:  ( abdomen)     Time: 1610-9604 PT Time Calculation (min) (ACUTE ONLY): 28 min  Charges:  $Gait Training: 23-37 mins                    G Codes:       Bailey Mech Fleet 05/03/2016, 2:26 PM  Dani Gobble. Migdalia Dk PT, DPT Acute Rehabilitation  [phone] Pager [phone]

## 2016-05-03 NOTE — Discharge Instructions (Signed)
[facility]      [facility] °[phone] ° °ABDOMINAL SURGERY: POST OP INSTRUCTIONS ° °Always review your discharge instruction sheet given to you by the facility where your surgery was performed. ° °IF YOU HAVE DISABILITY OR FAMILY LEAVE FORMS, YOU MUST BRING THEM TO THE OFFICE FOR PROCESSING.  PLEASE DO NOT GIVE THEM TO YOUR DOCTOR. ° °1. A prescription for pain medication may be given to you upon discharge.  Take your pain medication as prescribed, if needed.  If narcotic pain medicine is not needed, then you may take acetaminophen (Tylenol) or ibuprofen (Advil) as needed. °2. Take your usually prescribed medications unless otherwise directed. °3. If you need a refill on your pain medication, please contact your pharmacy. They will contact our office to request authorization.  Prescriptions will not be filled after 5pm or on week-ends. °4. You should follow a light diet the first few days after arrival home, such as soup and crackers, pudding, etc.unless your doctor has advised otherwise. A high-fiber, low fat diet can be resumed as tolerated.   Be sure to include lots of fluids daily. Most patients will experience some swelling and bruising on the chest and neck area.  Ice packs will help.  Swelling and bruising can take several days to resolve °5. Most patients will experience some swelling and bruising in the area of the incision. Ice pack will help. Swelling and bruising can take several days to resolve..  °6. It is common to experience some constipation if taking pain medication after surgery.  Increasing fluid intake and taking a stool softener will usually help or prevent this problem from occurring.  A mild laxative (Milk of Magnesia or Miralax) should be taken according to package directions if there are no bowel movements after 48 hours. °7.  You may have steri-strips (small skin tapes) in place directly over the incision.  These strips should be left on the skin for 10-14 days.  If your  surgeon used skin glue on the incision, you may shower in 48 hours.  The glue will flake off over the next 2-3 weeks.  Any sutures or staples will be removed at the office during your follow-up visit. You may find that a light gauze bandage over your incision may keep your staples from being rubbed or pulled. You may shower and replace the bandage daily. °8. ACTIVITIES:  You may resume regular (light) daily activities beginning the next day--such as daily self-care, walking, climbing stairs--gradually increasing activities as tolerated.  You may have sexual intercourse when it is comfortable.  Refrain from any heavy lifting or straining until approved by your doctor. °a. You may drive when you no longer are taking prescription pain medication, you can comfortably wear a seatbelt, and you can safely maneuver your car and apply brakes °b. Return to Work: __________8 weeks if applicable_________________________ °9. You should see your doctor in the office for a follow-up appointment approximately two weeks after your surgery.  Make sure that you call for this appointment within a day or two after you arrive home to insure a convenient appointment time. °OTHER INSTRUCTIONS:  °_____________________________________________________________ °_____________________________________________________________ ° °WHEN TO CALL YOUR DOCTOR: °1. Fever over 101.0 °2. Inability to urinate °3. Nausea and/or vomiting °4. Extreme swelling or bruising °5. Continued bleeding from incision. °6. Increased pain, redness, or drainage from the incision. °7. Difficulty swallowing or breathing °8. Muscle cramping or spasms. °9. Numbness or tingling in hands or feet or around lips. ° °The clinic staff is   available to answer your questions during regular business hours.  Please don’t hesitate to call and ask to speak to one of the nurses if you have concerns. ° °For further questions, please visit [email or web address] ° ° ° °

## 2016-05-03 NOTE — Discharge Summary (Signed)
Physician Discharge Summary  Patient ID: EVERLINE MAHAFFY MRN: [id number] DOB/AGE: 79-Dec-1937 79 y.o.  Admit date: 04/23/2016 Discharge date: 05/06/2016  Admission Diagnoses: Patient Active Problem List   Diagnosis Date Noted  . GERD (gastroesophageal reflux disease) 04/29/2016  .  04/29/2016  . IPMN (intraductal papillary mucinous neoplasm) 04/23/2016  . h/o Sepsis ([place]) 06/18/2013  .  03/29/2013  .  09/18/2012    09/18/2012  . CKD (chronic kidney disease) stage 3, GFR 30-59 ml/min 09/18/2012    Discharge Diagnoses:  Active Problems:   CKD (chronic kidney disease) stage 3, GFR 30-59 ml/min   IPMN (intraductal papillary mucinous neoplasm)   GERD (gastroesophageal reflux disease)   Hyperglycemia   Discharged Condition: stable  Hospital Course:  Pt was admitted to the ICU following a whipple for presumed main duct IMPN.  She had an epidural for pain control along with PCA for good pain control.  She did well other than feeling very sore.  Her NGT was pulled on POD 3.  She had a day or two with significant nausea.  She also complained of reflux.  She was transferred to stepdown with epidural.  She had her epidural removed on POD 4 and foley was removed after that.  She was able to have slow advance on diet.  She had continued to have blood sugars in the upper 100s, so medicine was consulted to assist with management of blood sugars.  Her pathology came back as IPMN with high grade dysplasia.    Her diet was able to slowly be advanced.  She did have significant hypertension which was controlled reasonably well with home medications once her diet was advanced.  Drains and staples were removed prior to discharge.  She had significant anemia which was stable and hypokalemia, which resolved prior to d/c on oral replacement.    Her appetite is low, as expected.  She is ambulatory.  Social work was asked to assist with placement given that she lives alone.  She was going to be discharged and  then threw up significantly 3/16 night.  This resolved over the weekend.  She will discharged 3/19 to SNF in stable condition.     Consults: Internal medicine  Significant Diagnostic Studies: labs: HCT 25.8 prior to d/c.  K 3.7, Cr 1.86  Treatments: IV hydration and surgery: see above.    Discharge Exam: Blood pressure 132/77, pulse 67, temperature 98 F (36.7 C), temperature source Oral, resp. rate 18, height 5\' 4"  (1.626 m), weight 74.8 kg (165 lb), SpO2 99 %. General appearance: alert, cooperative and no distress Resp: breathing comfortably GI: soft, non tender, non distended  Disposition: 01-Home or Self Care  Discharge Instructions    Call MD for:  persistant nausea and vomiting    Complete by:  As directed    Call MD for:  redness, tenderness, or signs of infection (pain, swelling, redness, odor or green/yellow discharge around incision site)    Complete by:  As directed    Call MD for:  severe uncontrolled pain    Complete by:  As directed    Call MD for:  temperature >100.4    Complete by:  As directed    Discharge diet:    Complete by:  As directed    Soft diet, advance to carb mod as tolerated.   Increase activity slowly    Complete by:  As directed      Allergies as of 05/06/2016      Reactions   Codeine  Other (See Comments)   headache   Doxycycline    unknown   Gemfibrozil Other (See Comments)   Myalgia    Hydrocodone-acetaminophen    unknown   Lotemax [loteprednol Etabonate]    burning   Statins Other (See Comments)   Muscle weakness      Medication List    TAKE these medications   acetaminophen 500 MG tablet Commonly known as:  TYLENOL Take 1,000 mg by mouth every 6 (six) hours as needed for moderate pain.   azelastine 0.1 % nasal spray Commonly known as:  ASTELIN Place 1 spray into both nostrils at bedtime as needed for rhinitis. Use in each nostril as directed   bisacodyl 10 MG suppository Commonly known as:  DULCOLAX Place 1 suppository  (10 mg total) rectally daily.   calcitRIOL 0.25 MCG capsule Commonly known as:  ROCALTROL Take 0.25 mcg by mouth every Monday, Wednesday, and Friday. At night   cinacalcet 30 MG tablet Commonly known as:  SENSIPAR Take 30 mg by mouth daily with supper.   clobetasol 0.05 % Gel Commonly known as:  TEMOVATE Apply 1 application topically daily as needed (itching).   clopidogrel 75 MG tablet Commonly known as:  PLAVIX Take 75 mg by mouth every morning.   diclofenac sodium 1 % Gel Commonly known as:  VOLTAREN Apply 2 g topically 4 (four) times daily as needed (hand pain).   diphenhydramine-acetaminophen 25-500 MG Tabs tablet Commonly known as:  TYLENOL PM Take 2 tablets by mouth at bedtime as needed (Sleep).   escitalopram 5 MG tablet Commonly known as:  LEXAPRO Take 5 mg by mouth at bedtime.   feeding supplement (PRO-STAT SUGAR FREE 64) Liqd Take 30 mLs by mouth 3 (three) times daily.   fenofibrate 145 MG tablet Commonly known as:  TRICOR Take 145 mg by mouth every morning.   Fish Oil 1200 MG Caps Take 2,400 mg by mouth daily.   GENTEAL TEARS OP Apply 1 drop to eye 3 (three) times daily as needed (dry eyes).   gi cocktail Susp suspension Take 30 mLs by mouth 3 (three) times daily as needed for indigestion. Shake well.   hydrALAZINE 25 MG tablet Commonly known as:  APRESOLINE Take 25 mg by mouth 3 (three) times daily.   insulin aspart 100 UNIT/ML injection Commonly known as:  novoLOG Inject 0-9 Units into the skin 4 (four) times daily -  before meals and at bedtime.   insulin glargine 100 UNIT/ML injection Commonly known as:  LANTUS Inject 0.04 mLs (4 Units total) into the skin daily.   ondansetron 4 MG disintegrating tablet Commonly known as:  ZOFRAN-ODT Take 1 tablet (4 mg total) by mouth every 6 (six) hours as needed for nausea.   pantoprazole 40 MG tablet Commonly known as:  PROTONIX Take 1 tablet (40 mg total) by mouth 2 (two) times daily.    polyethylene glycol packet Commonly known as:  MIRALAX / GLYCOLAX Take 17 g by mouth daily.   telmisartan 80 MG tablet Commonly known as:  MICARDIS Take 80 mg by mouth at bedtime.   traMADol 50 MG tablet Commonly known as:  ULTRAM Take 2 tablets (100 mg total) by mouth every 12 (twelve) hours as needed for moderate pain.   ULORIC 80 MG Tabs Generic drug:  Febuxostat Take 80 mg by mouth every morning.   Vitamin D (Ergocalciferol) 50000 units Caps capsule Commonly known as:  DRISDOL Take 50,000 Units by mouth every 7 (seven) days. Wednesdays  Contact information for follow-up providers    Yazmeen Woolf, MD Follow up in 3 week(s).   Specialty:  General Surgery Contact information: [place] [address] [phone]            Contact information for after-discharge care    [facility] Follow up.   Specialty:  [place] information: [place] [phone]                  Signed: Stark Klein 05/06/2016, 9:14 AM

## 2016-05-04 ENCOUNTER — Inpatient Hospital Stay (HOSPITAL_COMMUNITY): Payer: Medicare Other | Admitting: Speech Pathology

## 2016-05-04 ENCOUNTER — Inpatient Hospital Stay (HOSPITAL_COMMUNITY): Payer: Medicare Other | Admitting: Occupational Therapy

## 2016-05-04 ENCOUNTER — Inpatient Hospital Stay (HOSPITAL_COMMUNITY): Payer: Medicare Other | Admitting: Physical Therapy

## 2016-05-04 LAB — CBC
HEMATOCRIT: 25.8 % — AB (ref 36.0–46.0)
HEMOGLOBIN: 8.1 g/dL — AB (ref 12.0–15.0)
MCH: 28.4 pg (ref 26.0–34.0)
MCHC: 31.4 g/dL (ref 30.0–36.0)
MCV: 90.5 fL (ref 78.0–100.0)
Platelets: 481 10*3/uL — ABNORMAL HIGH (ref 150–400)
RBC: 2.85 MIL/uL — ABNORMAL LOW (ref 3.87–5.11)
RDW: 15.1 % (ref 11.5–15.5)
WBC: 10.9 10*3/uL — AB (ref 4.0–10.5)

## 2016-05-04 LAB — BASIC METABOLIC PANEL
ANION GAP: 9 (ref 5–15)
BUN: 11 mg/dL (ref 6–20)
CHLORIDE: 105 mmol/L (ref 101–111)
CO2: 25 mmol/L (ref 22–32)
Calcium: 8.6 mg/dL — ABNORMAL LOW (ref 8.9–10.3)
Creatinine, Ser: 1.89 mg/dL — ABNORMAL HIGH (ref 0.44–1.00)
GFR calc non Af Amer: 24 mL/min — ABNORMAL LOW (ref >60)
GFR, EST AFRICAN AMERICAN: 28 mL/min — AB (ref >60)
Glucose, Bld: 102 mg/dL — ABNORMAL HIGH (ref 65–99)
POTASSIUM: 4.1 mmol/L (ref 3.5–5.1)
Sodium: 139 mmol/L (ref 135–145)

## 2016-05-04 LAB — GLUCOSE, CAPILLARY
GLUCOSE-CAPILLARY: 100 mg/dL — AB (ref 65–99)
Glucose-Capillary: 102 mg/dL — ABNORMAL HIGH (ref 65–99)
Glucose-Capillary: 98 mg/dL (ref 65–99)
Glucose-Capillary: 127 mg/dL — ABNORMAL HIGH (ref 65–99)

## 2016-05-04 NOTE — Progress Notes (Signed)
Occupational Therapy Treatment Patient Details Name: Kim Burns MRN: [id number] DOB: 11/22/1935 Today's Date: 05/04/2016    History of present illness Pt is a 81 y/o female s/p whipple procedure secondary to pancreatic mass. PMH including but not limited to CKD, HTN and hx of CVA   OT comments  Pt progressing towards acute OT goals. Focus of session was activity tolerance and introduction of UB strengthening exercises to help prevent potential deconditioning due to lengthy hospital stay. Session details below. D/c plan remains appropriate.   Follow Up Recommendations  SNF;Supervision/Assistance - 24 hour    Equipment Recommendations  3 in 1 bedside commode    Recommendations for Other Services      Precautions / Restrictions Precautions Precautions: Fall Restrictions Weight Bearing Restrictions: No       Mobility Bed Mobility Overal bed mobility: Modified Independent                Transfers Overall transfer level: Needs assistance Equipment used: None Transfers: Sit to/from Stand Sit to Stand: Supervision              Balance Overall balance assessment: Needs assistance Sitting-balance support: Feet supported Sitting balance-Leahy Scale: Fair     Standing balance support: No upper extremity supported Standing balance-Leahy Scale: Fair                     ADL Overall ADL's : Needs assistance/impaired     Grooming: Wash/dry Radiographer, therapeutic: Supervision/safety;Ambulation   Toileting- Clothing Manipulation and Hygiene: Set up;Supervision/safety;Sit to/from stand       Functional mobility during ADLs: Min guard;Rolling walker General ADL Comments: Pt completed toilet transfer and 1 grooming task standing at sink. Pt then requesting to walk in hallway. Slow and cautious with functional mobility. Able to walk community distance with 1 seated rest break at midpoint and extra time.  Fatigued at end of session. RW utilized outside of hospital room to walk in hallway.      Vision                     Perception     Praxis      Cognition   Behavior During Therapy: [facility] for tasks assessed/performed Overall Cognitive Status: Within Functional Limits for tasks assessed                         Exercises Other Exercises Other Exercises: Issued level 1 theraband and instructed in 3 basic exercises for general UB strengthening.   Shoulder Instructions       General Comments      Pertinent Vitals/ Pain       Pain Assessment: Faces Faces Pain Scale: Hurts little more Pain Location: headache Pain Descriptors / Indicators: Dull Pain Intervention(s): Monitored during session  Home Living                                          Prior Functioning/Environment              Frequency  Min 2X/week        Progress Toward Goals  OT Goals(current goals can now be found in the care plan section)  Progress towards OT goals: Progressing toward goals  Acute Rehab  OT Goals Patient Stated Goal: go to rehab to get stronger OT Goal Formulation: With patient Time For Goal Achievement: 05/12/16 Potential to Achieve Goals: Good ADL Goals Pt Will Perform Upper Body Bathing: with set-up;sitting Pt Will Perform Lower Body Bathing: with set-up;with supervision;with adaptive equipment;sit to/from stand Pt Will Transfer to Toilet: with supervision;ambulating;bedside commode Pt Will Perform Toileting - Clothing Manipulation and hygiene: with supervision;sit to/from stand;sitting/lateral leans  Plan Discharge plan remains appropriate    Co-evaluation                 End of Session Equipment Utilized During Treatment: Rolling walker  OT Visit Diagnosis: Unsteadiness on feet (R26.81);Muscle weakness (generalized) (M62.81);Pain Pain - part of body:  (headache)   Activity Tolerance Patient limited by fatigue;Patient tolerated  treatment well   Patient Left in chair;with call bell/phone within reach;with family/visitor present   Nurse Communication          Time: 6606-0045 OT Time Calculation (min): 30 min  Charges: OT General Charges $OT Visit: 1 Procedure OT Treatments $Self Care/Home Management : 8-22 mins $Therapeutic Activity: 8-22 mins     Hortencia Pilar 05/04/2016, 4:16 PM

## 2016-05-04 NOTE — Progress Notes (Signed)
11 Days Post-Op  Subjective: Patient became nauseated and vomited large amount last night. Last BM yesterday  Objective: Vital signs in last 24 hours: Temp:  [98 F (36.7 C)-98.2 F (36.8 C)] 98 F (36.7 C) (03/17 0527) Pulse Rate:  [73-78] 78 (03/16 2151) Resp:  [17-18] 18 (03/17 0527) BP: (133-161)/(65-72) 139/65 (03/17 0527) SpO2:  [97 %-99 %] 98 % (03/17 0527) Last BM Date: 05/03/16  Intake/Output from previous day: 03/16 0701 - 03/17 0700 In: 60 [P.O.:60] Out: 400 [Urine:400] Intake/Output this shift: Total I/O In: 240 [P.O.:240] Out: -   General appearance: alert, cooperative and no distress Resp: breathing comfortably GI: soft, non distended.  approp tender.  Drain site dry Incision c/d/I - steri-strips intact .  Lab Results:   Recent Labs  05/02/16 0509  WBC 6.8  HGB 7.4*  HCT 22.5*  PLT 324   BMET  Recent Labs  05/03/16 0537 05/04/16 0604  NA 141 139  K 4.0 4.1  CL 107 105  CO2 24 25  GLUCOSE 117* 102*  BUN 8 11  CREATININE 1.68* 1.89*  CALCIUM 8.2* 8.6*   PT/INR No results for input(s): LABPROT, INR in the last 72 hours. ABG No results for input(s): PHART, HCO3 in the last 72 hours.  Invalid input(s): PCO2, PO2  Studies/Results: No results found.  Anti-infectives: Anti-infectives    Start     Dose/Rate Route Frequency Ordered Stop   04/23/16 1830  ceFAZolin (ANCEF) IVPB 2g/100 mL premix     2 g 200 mL/hr over 30 Minutes Intravenous Every 8 hours 04/23/16 1701 04/23/16 1828   04/23/16 0747  ceFAZolin (ANCEF) IVPB 2g/100 mL premix     2 g 200 mL/hr over 30 Minutes Intravenous On call to O.R. 04/23/16 0747 04/23/16 1027      Assessment/Plan: IPMN pancreatic head S/p Classic pancreaticoduodenectomy , Placement of pancreaticstent, Repair of umbilical hernia as part of closure 04/23/16.  Path: IMPN with high grade dysplasia. Hypertension Chronic kidney disease Sleep apnea Hx of TIA ABL anemia FEN:  Saline lock.  Hyperglycemia -  consult medicine.   DVT:  SCD   Plan:   Continue calorie count.  Diet as tolerated in terms of consistency.   Patient with nausea/ vomiting last night - will cancel discharge today.  Recheck labs.  Abdomen benign  Appreciate medicine input for DM treatment - no need for additional agents   LOS: 11 days    Reylene Stauder K. 05/04/2016

## 2016-05-04 NOTE — Progress Notes (Signed)
PROGRESS NOTE    Kim Burns  JIR:[id number] DOB: [birth year] DOA: 04/23/2016 PCP: Rachell Cipro, MD   Outpatient Specialists: General surgery Neurology Nephrology  Requesting physician: Dr Barry Dienes - general surgery  Reason for consultation: Hyperglycemia  Brief Narrative:  81 y.o.femalewho presented to [facility] on 04/26/2016 for elective whipple procedure for intraductal papillary mucinous neoplasm in the pancreatic head. Postoperatively, she has had typical abdominal pain and slow return of appetite. There is no personal or significant family history of DM, but hyperglycemia has been noted for which TRH is consulted.   Assessment & Plan:   Active Problems:   CKD (chronic kidney disease) stage 3, GFR 30-59 ml/min   IPMN (intraductal papillary mucinous neoplasm)   GERD (gastroesophageal reflux disease)   Hyperglycemia  Hyperglycemia: Mild. Stress-induced and pancreatic resection likely unmasked diminished beta cell reserve. She is at inpatient goal < 180mg /dl. No known h/o DM.  - most recent HbA1c 5.4 - Patient reports relatively diet controlled diabetes prior to admission with peak post-prandial glucose of 170. Given above A1c, would recommend no agents at this time on discharge and to have pt follow up closely with PCP. Would have patient monitor glucose intermittently at home. Agree with continued carb modified diet - Glucose remains stable at present  IPMN: s/p whipple on 04/26/16. - Per primary team, CCS  - remains stable  CKD, stage III - IV: Creatinine at presumed baseline ~1.6-2.0. Improved from pre-op.  - Tolerating PO intake - renal function near baseline  Reflux esophagitis: worse since surgery.  - Continue PPI, pepcid and GI cocktails prn - Currently stable  Hypokalemia - Resolved -Labs reviewed -recheck bmet in AM  Hypomagnesemia - Replaced -Labs reviewed  Antimicrobials: Anti-infectives    Start     Dose/Rate Route Frequency  Ordered Stop   04/23/16 1830  ceFAZolin (ANCEF) IVPB 2g/100 mL premix     2 g 200 mL/hr over 30 Minutes Intravenous Every 8 hours 04/23/16 1701 04/23/16 1828   04/23/16 0747  ceFAZolin (ANCEF) IVPB 2g/100 mL premix     2 g 200 mL/hr over 30 Minutes Intravenous On call to O.R. 04/23/16 0747 04/23/16 1027      Subjective: No complaints  Objective: Vitals:   05/03/16 2151 05/03/16 2204 05/04/16 0527 05/04/16 1320  BP: 133/68 133/68 139/65 (!) 158/73  Pulse: 78   82  Resp: 17  18 18   Temp: 98.1 F (36.7 C)  98 F (36.7 C) 98.7 F (37.1 C)  TempSrc: Oral  Oral Oral  SpO2: 97%  98% 98%  Weight:      Height:        Intake/Output Summary (Last 24 hours) at 05/04/16 1601 Last data filed at 05/04/16 7510  Gross per 24 hour  Intake              240 ml  Output              400 ml  Net             -160 ml   Filed Weights   04/26/16 1356 04/30/16 2056  Weight: 69 kg (152 lb 1.9 oz) 74.8 kg (165 lb)    Examination:  General exam: laying in bed, awake, in nad Respiratory system: normal chest rise, no wheezing Cardiovascular system: regular rhythm, s1, s2 on auscultation Gastrointestinal system: nondistended, nontender, pos BS Central nervous system: no seizures, no tremors Extremities: no cyanosis, no joint deformities Skin: no rashes, no pallor Psychiatry: affect normal// no auditory  hallucinations  Data Reviewed: I have personally reviewed following labs and imaging studies  CBC:  Recent Labs Lab 04/28/16 0421 04/30/16 0217 05/02/16 0509 05/04/16 1434  WBC 6.3 7.6 6.8 10.9*  HGB 7.3* 7.4* 7.4* 8.1*  HCT 22.9* 22.8* 22.5* 25.8*  MCV 89.5 88.7 88.9 90.5  PLT 249 304 324 528*   Basic Metabolic Panel:  Recent Labs Lab 04/28/16 0421 04/30/16 0217 05/02/16 0509 05/03/16 0537 05/04/16 0604  NA 139 139 140 141 139  K 3.4* 3.0* 2.9* 4.0 4.1  CL 108 108 110 107 105  CO2 24 23 22 24 25   GLUCOSE 143* 155* 118* 117* 102*  BUN 16 14 10 8 11   CREATININE 1.65*  1.74* 1.80* 1.68* 1.89*  CALCIUM 8.8* 8.5* 7.9* 8.2* 8.6*  MG  --   --  1.3* 2.0  --    GFR: Estimated Creatinine Clearance: 24.3 mL/min (A) (by C-G formula based on SCr of 1.89 mg/dL (H)). Liver Function Tests:  Recent Labs Lab 04/28/16 0421  AST 21  ALT 21  ALKPHOS 28*  BILITOT 0.6  PROT 5.8*  ALBUMIN 2.5*   No results for input(s): LIPASE, AMYLASE in the last 168 hours. No results for input(s): AMMONIA in the last 168 hours. Coagulation Profile: No results for input(s): INR, PROTIME in the last 168 hours. Cardiac Enzymes: No results for input(s): CKTOTAL, CKMB, CKMBINDEX, TROPONINI in the last 168 hours. BNP (last 3 results) No results for input(s): PROBNP in the last 8760 hours. HbA1C: No results for input(s): HGBA1C in the last 72 hours. CBG:  Recent Labs Lab 05/03/16 1207 05/03/16 1637 05/03/16 2153 05/04/16 0749 05/04/16 1205  GLUCAP 121* 108* 130* 102* 98   Lipid Profile: No results for input(s): CHOL, HDL, LDLCALC, TRIG, CHOLHDL, LDLDIRECT in the last 72 hours. Thyroid Function Tests: No results for input(s): TSH, T4TOTAL, FREET4, T3FREE, THYROIDAB in the last 72 hours. Anemia Panel: No results for input(s): VITAMINB12, FOLATE, FERRITIN, TIBC, IRON, RETICCTPCT in the last 72 hours. Sepsis Labs: No results for input(s): PROCALCITON, LATICACIDVEN in the last 168 hours.  No results found for this or any previous visit (from the past 240 hour(s)).   Radiology Studies: No results found.  Scheduled Meds: . bisacodyl  10 mg Rectal Daily  . calcitRIOL  0.25 mcg Oral Q M,W,F  . chlorhexidine  15 mL Mouth Rinse BID  . cinacalcet  30 mg Oral Q supper  . escitalopram  5 mg Oral QHS  . famotidine  20 mg Oral Daily  . febuxostat  80 mg Oral q morning - 10a  . feeding supplement (PRO-STAT SUGAR FREE 64)  30 mL Oral TID  . hydrALAZINE  25 mg Oral TID  . insulin aspart  0-9 Units Subcutaneous TID AC & HS  . insulin glargine  4 Units Subcutaneous Daily  .  multivitamin with minerals  1 tablet Oral Daily  . pantoprazole  40 mg Oral BID  . polyethylene glycol  17 g Oral Daily  . senna-docusate  1 tablet Oral BID   Continuous Infusions: . sodium chloride Stopped (04/23/16 1502)     LOS: 11 days   Dejour Vos, Orpah Melter, MD [facility] Pager [phone]  If 7PM-7AM, please contact night-coverage [email or web address] Password [email or web address] 05/04/2016, 4:01 PM

## 2016-05-05 ENCOUNTER — Inpatient Hospital Stay (HOSPITAL_COMMUNITY): Payer: Medicare Other | Admitting: Occupational Therapy

## 2016-05-05 LAB — BASIC METABOLIC PANEL
Anion gap: 9 (ref 5–15)
BUN: 13 mg/dL (ref 6–20)
CHLORIDE: 105 mmol/L (ref 101–111)
CO2: 22 mmol/L (ref 22–32)
CREATININE: 1.96 mg/dL — AB (ref 0.44–1.00)
Calcium: 8.6 mg/dL — ABNORMAL LOW (ref 8.9–10.3)
GFR calc non Af Amer: 23 mL/min — ABNORMAL LOW (ref >60)
GFR, EST AFRICAN AMERICAN: 27 mL/min — AB (ref >60)
Glucose, Bld: 103 mg/dL — ABNORMAL HIGH (ref 65–99)
Potassium: 3.9 mmol/L (ref 3.5–5.1)
Sodium: 136 mmol/L (ref 135–145)

## 2016-05-05 LAB — GLUCOSE, CAPILLARY
GLUCOSE-CAPILLARY: 99 mg/dL (ref 65–99)
GLUCOSE-CAPILLARY: 107 mg/dL — AB (ref 65–99)
GLUCOSE-CAPILLARY: 112 mg/dL — AB (ref 65–99)
Glucose-Capillary: 111 mg/dL — ABNORMAL HIGH (ref 65–99)

## 2016-05-05 MED ORDER — SODIUM CHLORIDE 0.9 % IV SOLN
INTRAVENOUS | Status: DC
  Administered 2016-05-05 (×2): via INTRAVENOUS

## 2016-05-05 NOTE — Progress Notes (Signed)
12 Days Post-Op  Subjective: Patient still nauseated, but no vomiting. This mostly occurs at night + BM  Objective: Vital signs in last 24 hours: Temp:  [98.6 F (37 C)-98.9 F (37.2 C)] 98.6 F (37 C) (03/18 0508) Pulse Rate:  [72-89] 72 (03/18 0508) Resp:  [17-18] 17 (03/18 0508) BP: (152-165)/(73-80) 152/80 (03/18 0508) SpO2:  [97 %-98 %] 97 % (03/18 0508) Last BM Date: 05/04/16  Intake/Output from previous day: 03/17 0701 - 03/18 0700 In: 480 [P.O.:480] Out: 1100 [Urine:1100] Intake/Output this shift: No intake/output data recorded.  General appearance: alert, cooperative and no distress Resp: breathing comfortably GI: soft, non distended. approp tender. Drain site dry Incision c/d/I - steri-strips intact  Lab Results:   Recent Labs  05/04/16 1434  WBC 10.9*  HGB 8.1*  HCT 25.8*  PLT 481*   BMET  Recent Labs  05/04/16 0604 05/05/16 0525  NA 139 136  K 4.1 3.9  CL 105 105  CO2 25 22  GLUCOSE 102* 103*  BUN 11 13  CREATININE 1.89* 1.96*  CALCIUM 8.6* 8.6*   PT/INR No results for input(s): LABPROT, INR in the last 72 hours. ABG No results for input(s): PHART, HCO3 in the last 72 hours.  Invalid input(s): PCO2, PO2  Studies/Results: No results found.  Anti-infectives: Anti-infectives    Start     Dose/Rate Route Frequency Ordered Stop   04/23/16 1830  ceFAZolin (ANCEF) IVPB 2g/100 mL premix     2 g 200 mL/hr over 30 Minutes Intravenous Every 8 hours 04/23/16 1701 04/23/16 1828   04/23/16 0747  ceFAZolin (ANCEF) IVPB 2g/100 mL premix     2 g 200 mL/hr over 30 Minutes Intravenous On call to O.R. 04/23/16 0747 04/23/16 1027      Assessment/Plan: IPMN pancreatic head S/p Classic pancreaticoduodenectomy , Placement of pancreaticstent, Repair of umbilical hernia as part of closure 04/23/16. Path: IMPN with high grade dysplasia. Hypertension Chronic kidney disease Sleep apnea Hx of TIA ABL anemia FEN: Saline lock.  Hyperglycemia -  consult medicine.  DVT: SCD  Plan:  Continue calorie count. Diet as tolerated in terms of consistency.  Patient with nausea/ vomiting last night  Abdomen benign Creatinine slightly elevated/ WBC slightly elevated - will restart IV fluids to rehydrate Possible discharge to [place] medicine input for DM treatment - no need for additional agents   LOS: 12 days    Kim Burns K. 05/05/2016

## 2016-05-05 NOTE — Progress Notes (Signed)
PROGRESS NOTE    Kim Burns  HFW:[id number] DOB: [birth year] DOA: 04/23/2016 PCP: Rachell Cipro, MD   Outpatient Specialists: General surgery Neurology Nephrology  Requesting physician: Dr Barry Dienes - general surgery  Reason for consultation: Hyperglycemia  Brief Narrative:  81 y.o.femalewho presented to [facility] on 04/26/2016 for elective whipple procedure for intraductal papillary mucinous neoplasm in the pancreatic head. Postoperatively, she has had typical abdominal pain and slow return of appetite. There is no personal or significant family history of DM, but hyperglycemia has been noted for which TRH is consulted.   Assessment & Plan:   Active Problems:   CKD (chronic kidney disease) stage 3, GFR 30-59 ml/min   IPMN (intraductal papillary mucinous neoplasm)   GERD (gastroesophageal reflux disease)   Hyperglycemia  Hyperglycemia: Mild. Stress-induced and pancreatic resection likely unmasked diminished beta cell reserve. She is at inpatient goal < 180mg /dl. No known h/o DM.  - most recent HbA1c 5.4 - Patient reports relatively diet controlled diabetes prior to admission with peak post-prandial glucose of 170. Agree with continued carb modified diet - Glucose remains stable at present - Will monitor patient's glucose overnight. As pt is planned for SNF, may consider continuation for low dose basal insulin. I will place order for DM meds on d/c pending glucose values overnight  IPMN: s/p whipple on 04/26/16. - Per primary team, [facility]  - remains stable  CKD, stage III - IV: Creatinine at presumed baseline ~1.6-2.0. Improved from pre-op.  - Tolerating PO intake - renal function near baseline  Reflux esophagitis: worse since surgery.  - Continue PPI, pepcid and GI cocktails prn - Currently stable  Hypokalemia - Resolved -Labs reviewed -recheck bmet in AM  Hypomagnesemia - Replaced -Labs reviewed  Antimicrobials: Anti-infectives    Start      Dose/Rate Route Frequency Ordered Stop   04/23/16 1830  ceFAZolin (ANCEF) IVPB 2g/100 mL premix     2 g 200 mL/hr over 30 Minutes Intravenous Every 8 hours 04/23/16 1701 04/23/16 1828   04/23/16 0747  ceFAZolin (ANCEF) IVPB 2g/100 mL premix     2 g 200 mL/hr over 30 Minutes Intravenous On call to O.R. 04/23/16 0747 04/23/16 1027      Subjective: without complaints  Objective: Vitals:   05/04/16 1320 05/04/16 2103 05/05/16 0508 05/05/16 1405  BP: (!) 158/73 (!) 165/76 (!) 152/80 (!) 157/75  Pulse: 82 89 72 74  Resp: 18 17 17 17   Temp: 98.7 F (37.1 C) 98.9 F (37.2 C) 98.6 F (37 C) 98.3 F (36.8 C)  TempSrc: Oral Oral Oral Oral  SpO2: 98% 97% 97% 98%  Weight:      Height:        Intake/Output Summary (Last 24 hours) at 05/05/16 1745 Last data filed at 05/05/16 1435  Gross per 24 hour  Intake              800 ml  Output             2100 ml  Net            -1300 ml   Filed Weights   04/26/16 1356 04/30/16 2056  Weight: 69 kg (152 lb 1.9 oz) 74.8 kg (165 lb)    Examination:  General exam: conversant, awake, in nad Respiratory system: normal chest rise, no wheezing Cardiovascular system: regular rhythm, s1, s2 on auscultation Gastrointestinal system: nondistended, nontender, pos BS Central nervous system: no seizures, no tremors Extremities: no cyanosis, no joint deformities Skin: no rashes, no  pallor Psychiatry: affect normal// no auditory hallucinations  Data Reviewed: I have personally reviewed following labs and imaging studies  CBC:  Recent Labs Lab 04/30/16 0217 05/02/16 0509 05/04/16 1434  WBC 7.6 6.8 10.9*  HGB 7.4* 7.4* 8.1*  HCT 22.8* 22.5* 25.8*  MCV 88.7 88.9 90.5  PLT 304 324 093*   Basic Metabolic Panel:  Recent Labs Lab 04/30/16 0217 05/02/16 0509 05/03/16 0537 05/04/16 0604 05/05/16 0525  NA 139 140 141 139 136  K 3.0* 2.9* 4.0 4.1 3.9  CL 108 110 107 105 105  CO2 23 22 24 25 22   GLUCOSE 155* 118* 117* 102* 103*  BUN 14 10  8 11 13   CREATININE 1.74* 1.80* 1.68* 1.89* 1.96*  CALCIUM 8.5* 7.9* 8.2* 8.6* 8.6*  MG  --  1.3* 2.0  --   --    GFR: Estimated Creatinine Clearance: 23.4 mL/min (A) (by C-G formula based on SCr of 1.96 mg/dL (H)). Liver Function Tests: No results for input(s): AST, ALT, ALKPHOS, BILITOT, PROT, ALBUMIN in the last 168 hours. No results for input(s): LIPASE, AMYLASE in the last 168 hours. No results for input(s): AMMONIA in the last 168 hours. Coagulation Profile: No results for input(s): INR, PROTIME in the last 168 hours. Cardiac Enzymes: No results for input(s): CKTOTAL, CKMB, CKMBINDEX, TROPONINI in the last 168 hours. BNP (last 3 results) No results for input(s): PROBNP in the last 8760 hours. HbA1C: No results for input(s): HGBA1C in the last 72 hours. CBG:  Recent Labs Lab 05/04/16 1632 05/04/16 2105 05/05/16 0751 05/05/16 1201 05/05/16 1723  GLUCAP 100* 127* 99 107* 112*   Lipid Profile: No results for input(s): CHOL, HDL, LDLCALC, TRIG, CHOLHDL, LDLDIRECT in the last 72 hours. Thyroid Function Tests: No results for input(s): TSH, T4TOTAL, FREET4, T3FREE, THYROIDAB in the last 72 hours. Anemia Panel: No results for input(s): VITAMINB12, FOLATE, FERRITIN, TIBC, IRON, RETICCTPCT in the last 72 hours. Sepsis Labs: No results for input(s): PROCALCITON, LATICACIDVEN in the last 168 hours.  No results found for this or any previous visit (from the past 240 hour(s)).   Radiology Studies: No results found.  Scheduled Meds: . bisacodyl  10 mg Rectal Daily  . calcitRIOL  0.25 mcg Oral Q M,W,F  . chlorhexidine  15 mL Mouth Rinse BID  . cinacalcet  30 mg Oral Q supper  . escitalopram  5 mg Oral QHS  . famotidine  20 mg Oral Daily  . febuxostat  80 mg Oral q morning - 10a  . feeding supplement (PRO-STAT SUGAR FREE 64)  30 mL Oral TID  . hydrALAZINE  25 mg Oral TID  . insulin aspart  0-9 Units Subcutaneous TID AC & HS  . insulin glargine  4 Units Subcutaneous Daily    . multivitamin with minerals  1 tablet Oral Daily  . pantoprazole  40 mg Oral BID  . polyethylene glycol  17 g Oral Daily  . senna-docusate  1 tablet Oral BID   Continuous Infusions: . sodium chloride Stopped (04/23/16 1502)  . sodium chloride 100 mL/hr at 05/05/16 1216     LOS: 12 days   Adrick Kestler, Orpah Melter, MD [facility] Pager [phone]  If 7PM-7AM, please contact night-coverage [email or web address] Password [facility] 05/05/2016, 5:45 PM

## 2016-05-06 ENCOUNTER — Inpatient Hospital Stay (HOSPITAL_COMMUNITY): Payer: Medicare Other | Admitting: Physical Therapy

## 2016-05-06 LAB — BASIC METABOLIC PANEL
Anion gap: 6 (ref 5–15)
BUN: 12 mg/dL (ref 6–20)
CALCIUM: 8.5 mg/dL — AB (ref 8.9–10.3)
CO2: 26 mmol/L (ref 22–32)
Chloride: 106 mmol/L (ref 101–111)
Creatinine, Ser: 1.86 mg/dL — ABNORMAL HIGH (ref 0.44–1.00)
GFR calc non Af Amer: 25 mL/min — ABNORMAL LOW (ref >60)
GFR, EST AFRICAN AMERICAN: 29 mL/min — AB (ref >60)
Glucose, Bld: 112 mg/dL — ABNORMAL HIGH (ref 65–99)
Potassium: 3.7 mmol/L (ref 3.5–5.1)
SODIUM: 138 mmol/L (ref 135–145)

## 2016-05-06 LAB — GLUCOSE, CAPILLARY
Glucose-Capillary: 110 mg/dL — ABNORMAL HIGH (ref 65–99)
Glucose-Capillary: 112 mg/dL — ABNORMAL HIGH (ref 65–99)

## 2016-05-06 MED ORDER — PRO-STAT SUGAR FREE PO LIQD: 30.0000 mL | Freq: Three times a day (TID) | ORAL | 0 refills | Status: DC

## 2016-05-06 MED ORDER — GLIMEPIRIDE 2 MG PO TABS: 2.0000 mg | ORAL_TABLET | Freq: Every day | ORAL | 0 refills | Status: DC

## 2016-05-06 MED ORDER — GLIMEPIRIDE 2 MG PO TABS: 2.0000 mg | ORAL_TABLET | Freq: Every day | ORAL | Status: DC

## 2016-05-06 MED ORDER — INSULIN GLARGINE 100 UNIT/ML ~~LOC~~ SOLN: 4.0000 [IU] | Freq: Every day | SUBCUTANEOUS | 11 refills | Status: DC

## 2016-05-06 MED ORDER — INSULIN ASPART 100 UNIT/ML ~~LOC~~ SOLN: 0.0000 [IU] | Freq: Three times a day (TID) | SUBCUTANEOUS | 11 refills | Status: DC

## 2016-05-06 NOTE — Progress Notes (Signed)
Physical Therapy Treatment Patient Details Name: Kim Burns MRN: [id number] DOB: 04/13/1935 Today's Date: 05/06/2016    History of Present Illness Pt is a 81 y/o female s/p whipple procedure secondary to pancreatic mass. PMH including but not limited to CKD, HTN and hx of CVA    PT Comments    On entry pt reported that she had been up since very early this morning and did not feel like taking a walk. Pt did agree to general LE strengthening at the side of the bed. Pt bed mobility and transfers to the RW are now modified independent. Pt to d/c to SNF later today. Pt continues to requires skilled PT at SNF for gait training and to improve LE strength and endurance to be safe in her environment.   Follow Up Recommendations  Supervision/Assistance - 24 hour;SNF     Equipment Recommendations  Rolling walker with 5" wheels    Recommendations for Other Services       Precautions / Restrictions Precautions Precautions: Fall Restrictions Weight Bearing Restrictions: No    Mobility  Bed Mobility Overal bed mobility: Modified Independent Bed Mobility: Supine to Sit     Supine to sit: Modified independent (Device/Increase time) Sit to supine: Modified independent (Device/Increase time)   General bed mobility comments: pt able to easily move in and out of the bed and reposition herself with use of the bedrails.  Transfers Overall transfer level: Modified independent Equipment used: Rolling walker (2 wheeled) Transfers: Sit to/from Stand Sit to Stand: Modified independent (Device/Increase time)         General transfer comment: slow but safe movement with proper hand placement       Balance Overall balance assessment: Modified Independent Sitting-balance support: Feet supported Sitting balance-Leahy Scale: Good     Standing balance support: Bilateral upper extremity supported Standing balance-Leahy Scale: Fair                      Cognition  Arousal/Alertness: Awake/alert   Overall Cognitive Status: Within Functional Limits for tasks assessed                      Exercises Total Joint Exercises Quad Sets: AROM;10 reps;Supine;Both Heel Slides: Both;AROM;10 reps;Supine Hip ABduction/ADduction: AROM;10 reps;Both;Standing Long Arc Quad: AROM;Both;10 reps;Seated Knee Flexion: AROM;Both;10 reps;Seated Marching in Standing: AROM;Both;10 reps;Standing Standing Hip Extension: AROM;10 reps;Both;Standing    General Comments General comments (skin integrity, edema, etc.): Pt Sob after standing exercises SaO2 on RA 93%      Pertinent Vitals/Pain Pain Assessment: Faces Faces Pain Scale: Hurts little more Pain Location: abdominal  Pain Descriptors / Indicators: Cramping Pain Intervention(s): Monitored during session  VSS           PT Goals (current goals can now be found in the care plan section) Acute Rehab PT Goals Patient Stated Goal: go to rehab to get stronger PT Goal Formulation: With patient Potential to Achieve Goals: Good Progress towards PT goals: Progressing toward goals    Frequency    Min 3X/week      PT Plan Current plan remains appropriate       End of Session Equipment Utilized During Treatment: Gait belt Activity Tolerance: Patient limited by fatigue Patient left: with call bell/phone within reach;in bed;with family/visitor present Nurse Communication: Mobility status PT Visit Diagnosis: Other abnormalities of gait and mobility (R26.89) Pain - part of body:  (abdomen)     Time: 7782-4235 PT Time Calculation (min) (ACUTE ONLY): 9  min  Charges:  $Therapeutic Exercise: 8-22 mins                    G Codes:       [place] 05/06/2016, 1:49 PM  Dani Gobble. Migdalia Dk PT, DPT Acute Rehabilitation  [phone] Pager [phone]

## 2016-05-06 NOTE — Clinical Social Work Placement (Signed)
   CLINICAL SOCIAL WORK PLACEMENT  NOTE  Date:  05/06/2016  Patient Details  Name: Kim Burns MRN: [id number] Date of Birth: Mar 04, 1935  Clinical Social Work is seeking post-discharge placement for this patient at the [place] level of care (*CSW will initial, date and re-position this form in  chart as items are completed):  Yes   Patient/family provided with [facility] Work Department's list of facilities offering this level of care within the geographic area requested by the patient (or if unable, by the patient's family).  Yes   Patient/family informed of their freedom to choose among providers that offer the needed level of care, that participate in Medicare, Medicaid or managed care program needed by the patient, have an available bed and are willing to accept the patient.  Yes   Patient/family informed of [facility]'s ownership interest in [facility] and [facility], as well as of the fact that they are under no obligation to receive care at these facilities.  PASRR submitted to EDS on 04/28/16     PASRR number received on 04/28/16     Existing PASRR number confirmed on       FL2 transmitted to all facilities in geographic area requested by pt/family on 04/28/16     FL2 transmitted to all facilities within larger geographic area on 04/28/16     Patient informed that his/her managed care company has contracts with or will negotiate with certain facilities, including the following:        Yes   Patient/family informed of bed offers received.  Patient chooses bed at [facility]     Physician recommends and patient chooses bed at      Patient to be transferred to [facility] on 05/06/16.  Patient to be transferred to facility by Son to transport via car     Patient family notified on 05/06/16 of transfer.  Name of family member notified:  Dailah Opperman     PHYSICIAN       Additional  Comment:  Pt is ready for discharge today and will go to [facility], after son signs facility paperwork at 1pm. Pt and son are aware and agreeable to discharge plan. CSW sent clinicals to [facility] and communicated with [facility] (admissions) for room and report. Room and report provided to RN and put in treatment team sticky note. Son to provide transport for pt. CSW is signing off as no further needs identified.  _______________________________________________ Truitt Merle, LCSW 05/06/2016, 3:39 PM

## 2016-05-06 NOTE — Progress Notes (Signed)
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 Postoperatively, she has had typical abdominal pain and slow return of appetite. There is no personal or significant family history of DM, but hyperglycemia has been noted for which [facility] is consulted.   Assessment & Plan:   Active Problems:   CKD (chronic kidney disease) stage 3, GFR 30-59 ml/min   IPMN (intraductal papillary mucinous neoplasm)   GERD (gastroesophageal reflux disease)   Hyperglycemia  Hyperglycemia: Mild. Stress-induced and pancreatic resection likely unmasked diminished beta cell reserve. She is at inpatient goal < 180mg /dl. No known h/o DM.  - most recent HbA1c 5.4 - Patient reports relatively diet controlled diabetes prior to admission with peak post-prandial glucose of 170. Agree with continued carb modified diet - Glucose remains stable at present on 4 units lantus - Discussed with pharmacy. OK to to give glimepiride. Will start at 2mg  daily with hold parameters. Will order rx incase patient is discharged today  IPMN: s/p whipple on 04/26/16. - Per primary team, [facility]  - remains stable  CKD, stage III - IV: Creatinine at presumed baseline ~1.6-2.0. Improved from pre-op.  - Tolerating PO intake - renal function near baseline  Reflux esophagitis: worse since surgery.  - Continue PPI, pepcid and GI cocktails prn - Currently stable  Hypokalemia - Resolved  Hypomagnesemia - Replaced  Will order low dose glimepiride on AVS incase patient is discharged today. Lytes stable. Renal function near baseline. Patient is  reporting increased thirst. Please encourage good PO intake/fluids. Otherwise, medically OK for discharge if warranted by primary service.  Antimicrobials: Anti-infectives    Start     Dose/Rate Route Frequency Ordered Stop   04/23/16 1830  ceFAZolin (ANCEF) IVPB 2g/100 mL premix     2 g 200 mL/hr over 30 Minutes Intravenous Every 8 hours 04/23/16 1701 04/23/16 1828   04/23/16 0747  ceFAZolin (ANCEF) IVPB 2g/100 mL premix     2 g 200 mL/hr over 30 Minutes Intravenous On call to O.R. 04/23/16 0747 04/23/16 1027      Subjective: Eager to start therapy  Objective: Vitals:   05/05/16 0508 05/05/16 1405 05/05/16 2145 05/06/16 0417  BP: (!) 152/80 (!) 157/75 (!) 166/80 132/77  Pulse: 72 74 73 67  Resp: 17 17  18   Temp: 98.6 F (37 C) 98.3 F (36.8 C) 98.2 F (36.8 C) 98 F (36.7 C)  TempSrc: Oral Oral Oral Oral  SpO2: 97% 98% 99% 99%  Weight:      Height:        Intake/Output Summary (Last 24 hours) at 05/06/16 1212 Last data filed at 05/06/16 0945  Gross per 24 hour  Intake              920 ml  Output             2750 ml  Net            -1830 ml   Filed Weights   04/26/16 1356 04/30/16 2056  Weight: 69 kg (152 lb 1.9 oz) 74.8 kg (165 lb)    Examination:  General exam: laying in bed, awake, in nad Respiratory system: normal resp effort, no wheezing Cardiovascular  system: regular rate, s1-2 Gastrointestinal system: soft, nondstended, pos BS Central nervous system: cn2-12 grossly intact, strength intact Extremities: perfused, no clubbing Skin: normal skin turgor, no notable skin lesions seen Psychiatry: mood normal// no visual hallucinations  Data Reviewed: I have personally reviewed following labs and imaging studies  CBC:  Recent Labs Lab 04/30/16 0217 05/02/16 0509 05/04/16 1434  WBC 7.6 6.8 10.9*  HGB 7.4* 7.4* 8.1*  HCT 22.8* 22.5* 25.8*  MCV 88.7 88.9 90.5  PLT 304 324 983*   Basic Metabolic Panel:  Recent Labs Lab 05/02/16 0509 05/03/16 0537  05/04/16 0604 05/05/16 0525 05/06/16 0604  NA 140 141 139 136 138  K 2.9* 4.0 4.1 3.9 3.7  CL 110 107 105 105 106  CO2 22 24 25 22 26   GLUCOSE 118* 117* 102* 103* 112*  BUN 10 8 11 13 12   CREATININE 1.80* 1.68* 1.89* 1.96* 1.86*  CALCIUM 7.9* 8.2* 8.6* 8.6* 8.5*  MG 1.3* 2.0  --   --   --    GFR: Estimated Creatinine Clearance: 24.7 mL/min (A) (by C-G formula based on SCr of 1.86 mg/dL (H)). Liver Function Tests: No results for input(s): AST, ALT, ALKPHOS, BILITOT, PROT, ALBUMIN in the last 168 hours. No results for input(s): LIPASE, AMYLASE in the last 168 hours. No results for input(s): AMMONIA in the last 168 hours. Coagulation Profile: No results for input(s): INR, PROTIME in the last 168 hours. Cardiac Enzymes: No results for input(s): CKTOTAL, CKMB, CKMBINDEX, TROPONINI in the last 168 hours. BNP (last 3 results) No results for input(s): PROBNP in the last 8760 hours. HbA1C: No results for input(s): HGBA1C in the last 72 hours. CBG:  Recent Labs Lab 05/05/16 0751 05/05/16 1201 05/05/16 1723 05/05/16 2145 05/06/16 0818  GLUCAP 99 107* 112* 111* 110*   Lipid Profile: No results for input(s): CHOL, HDL, LDLCALC, TRIG, CHOLHDL, LDLDIRECT in the last 72 hours. Thyroid Function Tests: No results for input(s): TSH, T4TOTAL, FREET4, T3FREE, THYROIDAB in the last 72 hours. Anemia Panel: No results for input(s): VITAMINB12, FOLATE, FERRITIN, TIBC, IRON, RETICCTPCT in the last 72 hours. Sepsis Labs: No results for input(s): PROCALCITON, LATICACIDVEN in the last 168 hours.  No results found for this or any previous visit (from the past 240 hour(s)).   Radiology Studies: No results found.  Scheduled Meds: . bisacodyl  10 mg Rectal Daily  . calcitRIOL  0.25 mcg Oral Q M,W,F  . chlorhexidine  15 mL Mouth Rinse BID  . cinacalcet  30 mg Oral Q supper  . escitalopram  5 mg Oral QHS  . famotidine  20 mg Oral Daily  . febuxostat  80 mg Oral q morning - 10a  . feeding  supplement (PRO-STAT SUGAR FREE 64)  30 mL Oral TID  . [START ON 05/07/2016] glimepiride  2 mg Oral Q breakfast  . hydrALAZINE  25 mg Oral TID  . insulin aspart  0-9 Units Subcutaneous TID AC & HS  . multivitamin with minerals  1 tablet Oral Daily  . pantoprazole  40 mg Oral BID  . polyethylene glycol  17 g Oral Daily  . senna-docusate  1 tablet Oral BID   Continuous Infusions: . sodium chloride Stopped (04/23/16 1502)  . sodium chloride 100 mL/hr at 05/05/16 2002     LOS: 13 days   Earvin Blazier, Orpah Melter, MD [facility] Pager [phone]  If 7PM-7AM, please contact night-coverage [email or web address] Password [email or web address] 05/06/2016, 12:12 PM

## 2016-05-06 NOTE — Progress Notes (Signed)
Called report to RN at [facility] Discharged patient by car to [facility], accompanied by her son.

## 2016-05-22 ENCOUNTER — Ambulatory Visit
Admission: RE | Admit: 2016-05-22 | Discharge: 2016-05-22 | Disposition: A | Discharge status: Home / Self Care | Payer: Medicare Other | Source: Ambulatory Visit | Attending: General Surgery | Admitting: General Surgery

## 2016-05-22 ENCOUNTER — Other Ambulatory Visit: Payer: Self-pay | Admitting: General Surgery

## 2016-05-22 DIAGNOSIS — R0602 Shortness of breath: Secondary | ICD-10-CM

## 2016-05-22 NOTE — Progress Notes (Signed)
Please let patient know CXR looks good.  Improved since she was in the hospital.

## 2016-07-20 NOTE — Addendum Note (Signed)
Addendum  created 07/20/16 1113 by Duane Boston, MD   Sign clinical note

## 2016-09-03 ENCOUNTER — Encounter: Payer: Medicare Other | Attending: Family Medicine | Admitting: *Deleted

## 2016-09-03 DIAGNOSIS — E109 Type 1 diabetes mellitus without complications: Secondary | ICD-10-CM

## 2016-09-03 DIAGNOSIS — Z713 Dietary counseling and surveillance: Secondary | ICD-10-CM | POA: Insufficient documentation

## 2016-09-03 NOTE — Patient Instructions (Signed)
Plan:  Aim for 2 Carb Choices per meal (30 grams) +/- 1 either way  Aim for 0-15 grams of Carbs per snack if hungry and 30 grams for evening snack Include protein in moderation with your meals and snacks Continue reading food labels for Total Carbohydrate of foods Continue your activity level daily as tolerated Continue checking BG at alternate times per day   Continue taking medication as directed by MD

## 2016-09-04 NOTE — Progress Notes (Signed)
Diabetes Self-Management Education  Visit Type: First/Initial  Appt. Start Time: 1400 Appt. End Time: 8588  09/04/2016  [name], identified by name and date of birth, is a 81 y.o. female with a diagnosis of Diabetes: Type 1. She had partial pancreatectomy in March this year resulting in diabetes, probably Type 1. However she states she takes rapid acting Humalog with each meal, no insulin covering overnight and her FBG are in the low 100's mg/dl. She lives alone, prepares her own meals and is very pro-active in her own care. Diet history obtained. She is testing her BG more than 4 times a day and recording them on BG Log Sheet along with her sliding scale insulin doses.  ASSESSMENT  Height 5\' 4"  (1.626 m), weight 128 lb 14.4 oz (58.5 kg). Body mass index is 22.13 kg/m.      Diabetes Self-Management Education - 09/03/16 1404      Visit Information   Visit Type First/Initial     Initial Visit   Diabetes Type Type 1   Are you taking your medications as prescribed? No   Date Diagnosed 04/2016     Health Coping   How would you rate your overall health? Good     Psychosocial Assessment   Patient Belief/Attitude about Diabetes Motivated to manage diabetes   Other persons present Patient   Patient Concerns Nutrition/Meal planning;Glycemic Control   How often do you need to have someone help you when you read instructions, pamphlets, or other written materials from your doctor or pharmacy? 1 - Never   What is the last grade level you completed in school? Kim Burns     Pre-Education Assessment   Patient understands the diabetes disease and treatment process. Needs Instruction   Patient understands incorporating nutritional management into lifestyle. Needs Instruction   Patient undertands incorporating physical activity into lifestyle. Needs Instruction   Patient understands using medications safely. Needs Instruction   Patient understands monitoring blood glucose,  interpreting and using results Needs Review   Patient understands prevention, detection, and treatment of acute complications. Needs Instruction   Patient understands how to develop strategies to address psychosocial issues. Needs Review   Patient understands how to develop strategies to promote health/change behavior. Needs Review     Complications   How often do you check your blood sugar? > 4 times/day   Fasting Blood glucose range (mg/dL) 70-129;130-179   Postprandial Blood glucose range (mg/dL) 70-129;130-179   Number of hypoglycemic episodes per month 3   Can you tell when your blood sugar is low? Yes   Have you had a dilated eye exam in the past 12 months? Yes   Have you had a dental exam in the past 12 months? Yes   Are you checking your feet? Yes   How many days per week are you checking your feet? 7     Dietary Intake   Breakfast 2-3 [place] bacon, 2 egg yolks, 1 toast with smart balance and 1 tsp jelly OR biscuit with diluted country ham @ 2 oz, occasionally with sausage or [place] bacon   Lunch tossed salad with raw veggies, 2 oz lean meat, cheese, 1 Tbsp dressing, 2 crackers or triscuits OR homemade chicken or [place] salad with nuts with 2-4 crackers, small serving of fresh fruit OR homemade low carb oup    Snack (afternoon) small ice cream sandwich OR sliced apple with PNB OR provolone cheese slice OR some fresh fruit    Dinner lean meat, 2 vegetables  steamed, occasionally with cheese added, occasionally with sweet potato   Snack (evening) whole tomato sandwich with [place] bacon OR granola bar   Beverage(s) coffee, water OR sparkling water, unsweet tea     Exercise   Exercise Type ADL's  some walking in place and chair exercises at church   How many days per week to you exercise? 30   How many minutes per day do you exercise? 3   Total minutes per week of exercise 90     Patient Education   Previous Diabetes Education No   Disease state  Definition of diabetes, type 1 and  2, and the diagnosis of diabetes;Factors that contribute to the development of diabetes  partial pancreatectomy   Nutrition management  Role of diet in the treatment of diabetes and the relationship between the three main macronutrients and blood glucose level;Food label reading, portion sizes and measuring food.;Carbohydrate counting   Physical activity and exercise  Role of exercise on diabetes management, blood pressure control and cardiac health.   Medications Reviewed patients medication for diabetes, action, purpose, timing of dose and side effects.   Monitoring Identified appropriate SMBG and/or A1C goals.   Acute complications Taught treatment of hypoglycemia - the 15 rule.   Psychosocial adjustment Worked with patient to identify barriers to care and solutions;Role of stress on diabetes     Individualized Goals (developed by patient)   Nutrition Follow meal plan discussed   Physical Activity Exercise 3-5 times per week   Medications take my medication as prescribed   Monitoring  test blood glucose pre and post meals as discussed     Post-Education Assessment   Patient understands the diabetes disease and treatment process. Demonstrates understanding / competency   Patient understands incorporating nutritional management into lifestyle. Demonstrates understanding / competency   Patient undertands incorporating physical activity into lifestyle. Demonstrates understanding / competency   Patient understands using medications safely. Demonstrates understanding / competency   Patient understands monitoring blood glucose, interpreting and using results Demonstrates understanding / competency   Patient understands prevention, detection, and treatment of acute complications. Demonstrates understanding / competency   Patient understands prevention, detection, and treatment of chronic complications. Demonstrates understanding / competency   Patient understands how to develop strategies to  address psychosocial issues. Demonstrates understanding / competency   Patient understands how to develop strategies to promote health/change behavior. Demonstrates understanding / competency     Outcomes   Expected Outcomes Demonstrated interest in learning. Expect positive outcomes   Future DMSE PRN   Program Status Completed     Individualized Plan for Diabetes Self-Management Training:   Learning Objective:  Patient will have a greater understanding of diabetes self-management. Patient education plan is to attend individual and/or group sessions per assessed needs and concerns.   Plan:   Patient Instructions  Plan:  Aim for 2 Carb Choices per meal (30 grams) +/- 1 either way  Aim for 0-15 grams of Carbs per snack if hungry and 30 grams for evening snack Include protein in moderation with your meals and snacks Continue reading food labels for Total Carbohydrate of foods Continue your activity level daily as tolerated Continue checking BG at alternate times per day   Continue taking medication as directed by MD  Expected Outcomes:  Demonstrated interest in learning. Expect positive outcomes  Education material provided: Living Well with Diabetes, A1C conversion sheet, Meal plan card and Carbohydrate counting sheet, Insulin Action handout  If problems or questions, patient to contact team via:  Phone  Future DSME appointment: PRN

## 2016-09-12 ENCOUNTER — Other Ambulatory Visit: Payer: Self-pay | Admitting: Otolaryngology

## 2016-09-12 DIAGNOSIS — H9312 Tinnitus, left ear: Secondary | ICD-10-CM

## 2016-09-12 DIAGNOSIS — H933X2 Disorders of left acoustic nerve: Secondary | ICD-10-CM

## 2016-09-27 ENCOUNTER — Ambulatory Visit
Admission: RE | Admit: 2016-09-27 | Discharge: 2016-09-27 | Disposition: A | Discharge status: Home / Self Care | Payer: Medicare Other | Source: Ambulatory Visit | Attending: Otolaryngology | Admitting: Otolaryngology

## 2016-09-27 DIAGNOSIS — H933X2 Disorders of left acoustic nerve: Secondary | ICD-10-CM

## 2016-09-27 DIAGNOSIS — H9312 Tinnitus, left ear: Secondary | ICD-10-CM

## 2016-10-03 ENCOUNTER — Encounter (HOSPITAL_COMMUNITY): Payer: Self-pay

## 2016-10-03 ENCOUNTER — Inpatient Hospital Stay (HOSPITAL_COMMUNITY)
Admission: EM | Admit: 2016-10-03 | Discharge: 2016-10-07 | DRG: 683 | Disposition: A | Discharge status: Home / Self Care | Payer: Medicare Other | Attending: Family Medicine | Admitting: Family Medicine

## 2016-10-03 ENCOUNTER — Inpatient Hospital Stay (HOSPITAL_COMMUNITY): Payer: Medicare Other

## 2016-10-03 ENCOUNTER — Emergency Department (HOSPITAL_COMMUNITY): Payer: Medicare Other

## 2016-10-03 DIAGNOSIS — E440 Moderate protein-calorie malnutrition: Secondary | ICD-10-CM | POA: Diagnosis present

## 2016-10-03 DIAGNOSIS — D631 Anemia in chronic kidney disease: Secondary | ICD-10-CM | POA: Diagnosis present

## 2016-10-03 DIAGNOSIS — N19 Unspecified kidney failure: Secondary | ICD-10-CM

## 2016-10-03 DIAGNOSIS — N189 Chronic kidney disease, unspecified: Secondary | ICD-10-CM | POA: Diagnosis not present

## 2016-10-03 DIAGNOSIS — E872 Acidosis: Secondary | ICD-10-CM | POA: Diagnosis not present

## 2016-10-03 DIAGNOSIS — Z794 Long term (current) use of insulin: Secondary | ICD-10-CM

## 2016-10-03 DIAGNOSIS — Z7902 Long term (current) use of antithrombotics/antiplatelets: Secondary | ICD-10-CM | POA: Diagnosis not present

## 2016-10-03 DIAGNOSIS — N179 Acute kidney failure, unspecified: Secondary | ICD-10-CM | POA: Diagnosis not present

## 2016-10-03 DIAGNOSIS — N184 Chronic kidney disease, stage 4 (severe): Secondary | ICD-10-CM | POA: Diagnosis not present

## 2016-10-03 DIAGNOSIS — E876 Hypokalemia: Secondary | ICD-10-CM | POA: Diagnosis not present

## 2016-10-03 DIAGNOSIS — F419 Anxiety disorder, unspecified: Secondary | ICD-10-CM | POA: Diagnosis not present

## 2016-10-03 DIAGNOSIS — D333 Benign neoplasm of cranial nerves: Secondary | ICD-10-CM | POA: Diagnosis present

## 2016-10-03 DIAGNOSIS — Z6821 Body mass index (BMI) 21.0-21.9, adult: Secondary | ICD-10-CM | POA: Diagnosis not present

## 2016-10-03 DIAGNOSIS — I951 Orthostatic hypotension: Secondary | ICD-10-CM | POA: Diagnosis present

## 2016-10-03 DIAGNOSIS — M109 Gout, unspecified: Secondary | ICD-10-CM | POA: Diagnosis present

## 2016-10-03 DIAGNOSIS — Z79899 Other long term (current) drug therapy: Secondary | ICD-10-CM

## 2016-10-03 DIAGNOSIS — Z90411 Acquired partial absence of pancreas: Secondary | ICD-10-CM

## 2016-10-03 DIAGNOSIS — N2581 Secondary hyperparathyroidism of renal origin: Secondary | ICD-10-CM | POA: Diagnosis present

## 2016-10-03 DIAGNOSIS — E1122 Type 2 diabetes mellitus with diabetic chronic kidney disease: Secondary | ICD-10-CM | POA: Diagnosis present

## 2016-10-03 DIAGNOSIS — Z8507 Personal history of malignant neoplasm of pancreas: Secondary | ICD-10-CM

## 2016-10-03 DIAGNOSIS — R531 Weakness: Secondary | ICD-10-CM | POA: Diagnosis present

## 2016-10-03 DIAGNOSIS — I129 Hypertensive chronic kidney disease with stage 1 through stage 4 chronic kidney disease, or unspecified chronic kidney disease: Secondary | ICD-10-CM | POA: Diagnosis present

## 2016-10-03 DIAGNOSIS — Z8673 Personal history of transient ischemic attack (TIA), and cerebral infarction without residual deficits: Secondary | ICD-10-CM

## 2016-10-03 DIAGNOSIS — K219 Gastro-esophageal reflux disease without esophagitis: Secondary | ICD-10-CM | POA: Diagnosis present

## 2016-10-03 DIAGNOSIS — G4730 Sleep apnea, unspecified: Secondary | ICD-10-CM | POA: Diagnosis present

## 2016-10-03 LAB — BASIC METABOLIC PANEL
Anion gap: 11 (ref 5–15)
Anion gap: 9 (ref 5–15)
BUN: 77 mg/dL — ABNORMAL HIGH (ref 6–20)
BUN: 74 mg/dL — AB (ref 6–20)
CALCIUM: 8.7 mg/dL — AB (ref 8.9–10.3)
CHLORIDE: 111 mmol/L (ref 101–111)
CO2: 17 mmol/L — ABNORMAL LOW (ref 22–32)
CO2: 18 mmol/L — AB (ref 22–32)
CREATININE: 4.89 mg/dL — AB (ref 0.44–1.00)
Calcium: 8.7 mg/dL — ABNORMAL LOW (ref 8.9–10.3)
Chloride: 113 mmol/L — ABNORMAL HIGH (ref 101–111)
Creatinine, Ser: 5.31 mg/dL — ABNORMAL HIGH (ref 0.44–1.00)
GFR calc Af Amer: 9 mL/min — ABNORMAL LOW (ref >60)
GFR, EST AFRICAN AMERICAN: 8 mL/min — AB (ref >60)
GFR, EST NON AFRICAN AMERICAN: 7 mL/min — AB (ref >60)
GFR, EST NON AFRICAN AMERICAN: 8 mL/min — AB (ref >60)
GLUCOSE: 202 mg/dL — AB (ref 65–99)
Glucose, Bld: 166 mg/dL — ABNORMAL HIGH (ref 65–99)
POTASSIUM: 3.1 mmol/L — AB (ref 3.5–5.1)
Potassium: 2.7 mmol/L — CL (ref 3.5–5.1)
SODIUM: 139 mmol/L (ref 135–145)
Sodium: 140 mmol/L (ref 135–145)

## 2016-10-03 LAB — CBC
HEMATOCRIT: 28.6 % — AB (ref 36.0–46.0)
Hemoglobin: 9.4 g/dL — ABNORMAL LOW (ref 12.0–15.0)
MCH: 28.8 pg (ref 26.0–34.0)
MCHC: 32.9 g/dL (ref 30.0–36.0)
MCV: 87.7 fL (ref 78.0–100.0)
Platelets: 307 10*3/uL (ref 150–400)
RBC: 3.26 MIL/uL — AB (ref 3.87–5.11)
RDW: 15.3 % (ref 11.5–15.5)
WBC: 6.9 10*3/uL (ref 4.0–10.5)

## 2016-10-03 LAB — MAGNESIUM: Magnesium: 1.6 mg/dL — ABNORMAL LOW (ref 1.7–2.4)

## 2016-10-03 LAB — URINALYSIS, ROUTINE W REFLEX MICROSCOPIC
BILIRUBIN URINE: NEGATIVE
GLUCOSE, UA: NEGATIVE mg/dL
Ketones, ur: NEGATIVE mg/dL
NITRITE: NEGATIVE
PROTEIN: NEGATIVE mg/dL
SPECIFIC GRAVITY, URINE: 1.006 (ref 1.005–1.030)
pH: 5 (ref 5.0–8.0)

## 2016-10-03 LAB — PHOSPHORUS: Phosphorus: 5.5 mg/dL — ABNORMAL HIGH (ref 2.5–4.6)

## 2016-10-03 MED ORDER — PANTOPRAZOLE SODIUM 20 MG PO TBEC
20.0000 mg | DELAYED_RELEASE_TABLET | Freq: Every day | ORAL | Status: DC
  Administered 2016-10-03 – 2016-10-04 (×2): 20 mg via ORAL
  Filled 2016-10-03 (×2): qty 1

## 2016-10-03 MED ORDER — CLOPIDOGREL BISULFATE 75 MG PO TABS
75.0000 mg | ORAL_TABLET | Freq: Every morning | ORAL | Status: DC
Start: 2016-10-04 — End: 2016-10-07
  Administered 2016-10-04 – 2016-10-07 (×4): 75 mg via ORAL
  Filled 2016-10-03 (×4): qty 1

## 2016-10-03 MED ORDER — PANCRELIPASE (LIP-PROT-AMYL) 12000-38000 UNITS PO CPEP: 12000.0000 [IU] | ORAL_CAPSULE | Freq: Three times a day (TID) | ORAL | Status: DC | PRN

## 2016-10-03 MED ORDER — SODIUM CHLORIDE 0.9 % IV BOLUS (SEPSIS)
1000.0000 mL | Freq: Once | INTRAVENOUS | Status: AC
  Administered 2016-10-03: 1000 mL via INTRAVENOUS

## 2016-10-03 MED ORDER — HYDRALAZINE HCL 20 MG/ML IJ SOLN: 2.0000 mg | INTRAMUSCULAR | Status: DC | PRN

## 2016-10-03 MED ORDER — INSULIN ASPART 100 UNIT/ML ~~LOC~~ SOLN
0.0000 [IU] | Freq: Three times a day (TID) | SUBCUTANEOUS | Status: DC
  Administered 2016-10-04: 3 [IU] via SUBCUTANEOUS
  Administered 2016-10-05 – 2016-10-06 (×2): 2 [IU] via SUBCUTANEOUS
  Administered 2016-10-07 (×3): 1 [IU] via SUBCUTANEOUS

## 2016-10-03 MED ORDER — VITAMIN D 1000 UNITS PO TABS
1000.0000 [IU] | ORAL_TABLET | Freq: Every day | ORAL | Status: DC
  Administered 2016-10-03 – 2016-10-07 (×5): 1000 [IU] via ORAL
  Filled 2016-10-03 (×5): qty 1

## 2016-10-03 MED ORDER — HEPARIN SODIUM (PORCINE) 5000 UNIT/ML IJ SOLN
5000.0000 [IU] | Freq: Three times a day (TID) | INTRAMUSCULAR | Status: DC
  Administered 2016-10-03 – 2016-10-07 (×12): 5000 [IU] via SUBCUTANEOUS
  Filled 2016-10-03 (×12): qty 1

## 2016-10-03 MED ORDER — POTASSIUM CHLORIDE CRYS ER 10 MEQ PO TBCR
10.0000 meq | EXTENDED_RELEASE_TABLET | Freq: Once | ORAL | Status: AC
  Administered 2016-10-03: 10 meq via ORAL
  Filled 2016-10-03: qty 1

## 2016-10-03 MED ORDER — ESCITALOPRAM OXALATE 10 MG PO TABS
15.0000 mg | ORAL_TABLET | Freq: Every day | ORAL | Status: DC
  Administered 2016-10-03 – 2016-10-06 (×4): 15 mg via ORAL
  Filled 2016-10-03 (×4): qty 2

## 2016-10-03 MED ORDER — SODIUM CHLORIDE 0.9 % IV SOLN
INTRAVENOUS | Status: DC
  Administered 2016-10-03 – 2016-10-05 (×3): via INTRAVENOUS

## 2016-10-03 NOTE — H&P (Signed)
[facility] Admission History and Physical Service Pager: [phone]  Patient name: Kim Burns Medical record number: [id number] Date of birth: [birth year] Age: 81 y.o. Gender: female  Primary Care Provider: [name], MD Consultants: Plan to call nephro in AM Code Status: Full code, confirmed on admission  Chief Complaint:  Elevated Cr on outpt labs  Assessment and Plan: Kim Burns is a 81 y.o. female presenting with acute renal failure noted on outpatient labs drawn yesterday . PMH is significant for pancreatic mass s/p resection 04/2016, diabetes, HTN, hx TIA 1995 on plavix, Anxiety, and GERD  Acute renal failure on CKD IV- May be prerenal given history of 4-5 episodes of diarrhea per day. With history of intra-abdominal mass, intrarenal etiology/mass is a possibility, patient denies night sweats but does have 40 lb unintentional wt loss in last 5 months. Medication list reviewed, no iatrogenic causes identified.  Post-renal etiology seems less likely with normal urination. On admission, cr 5.31 from baseline 1.8-1.9. BUN elevated at 77. No alterations in mentation, though symptomatic with fatigue and generalized weakness. Estimated Creatinine Clearance: 7.4 mL/min. Patient continues to urinate normally. Patient reports following with Dr. Marval Regal with [facility]. - admit FPTS attending Dr. Erin Hearing - monitor on telemetry - recheck MN BMP for BUN, Cr, and K - check urine electrolytes, calculate FeNa - check EKG - check mag, phos, AM renal function panel - monitor strict intake/output - renal ultrasound - consult to nephrology in AM -  IVF resuscitation with NS @75  cc/hr - avoid nephrotoxic agents - up with assistance - vitals per unit  Hypokalemia - K 3.1 on admission. Likely low in setting of chronic diarrhea. Careful repletion in setting of acute renal failure. - 10 mEq KDUR ordered - recheck MN BMP and AM renal function  panel - continue careful repletion as needed  Chronic Diarrhea, stable: may be 2/2 pancreatic mass resection, has been chronic since her surgery in 04/2016. 4-5 episodes daily, primarily at nighttime.Takes 2 creon tablets prior to meals, one creon tablet prior to snacks. - continue home creon  Diabetes - may be insulin deficiency 2/2 pancreatic procedure, as patient indicates she has had diabetes since her pancreatic mass resection in 04/2016. Was previously on glimepiride which was stopped by PCP. Takes novolog sliding scale with meals only. - CBGs QID with meals and bedtime - sensitive sliding scale  HTN - most recently hypotensive at clinic visits and so reports 25 mg TID hydralazine was discontinued. Meant to have follow up tomorrow which will be canceled due to hospitalization.  - vitals stable in ED, can initiate hydral TID as needed for hypertension - PRN hydral IV added for BP>180/ >100  History of pancreatic mass, s/p pancreatectomy for intraductal papillary mucinous neoplasia (IPMN with high grade dysplasia).  Patient reports she continues to follow with ?Surgery for this problem, states she was also meant to have an initial visit with Dr. Paulita Fujita with GI tomorrow 8/17, however called to cancel this appt due to being in the hospital. - continue creon as noted above  History of TIA 1995 - takes daily Clopidogrel 75 mg. Continued on admission.  Anxiety, stable - normal affect in ED. Continue home medication lexapro 15 mg qhs.  GERD, stable - protonix 40 BID noted in med rec; high dose. Re-ordered as 20 mg daily.   FEN/GI: Renal diet, cautious repletion of potassium, frequent BMPs Prophylaxis: Heparin given reduced CrCl  Disposition: Admit to FPTS telemetry  History of Present  Illness:  Kim Burns is a 81 y.o. female presenting with elevated creatinine and BUN discovered on outpatient labs, which triggered her to be sent in to the hospital. Patient does endorse generalized  fatigue and weakness that has been ongoing since a whipple procedure and pancreatic mass resection in 04/2016. She reports this was a benign, non-cancerous mass, per chart review it was  intraductal papillary mucinous neoplasia (IPMN with high grade dysplasia). She has had chronic diarrhea since that procedure, 4-5 episodes of non-bloody diarrhea per night. She denies oliguria with two episodes of urination since she got to the ED, no increased frequency or urgency, no hematuria. She denies confusion, dizziness, no palpitations or chest pain. No nausea or vomiting.  No dyspnea, no LE swelling, no PND. She does endorse 40 pound weight loss since her whipple procedure in 04/2016, denies night sweats or LAD that she has noticed.   She reports following with [facility] for lab monitoring of kidney function, and when she was found toh ave elevated creatinine and BUN she was sent to ED for further evaluation.  ED course: patient was given a bolus of IVS, got an CXR and labs. Decision was made to admit for further monitoring and workup.  Review Of Systems: Per HPI.  ROS  Patient Active Problem List   Diagnosis Date Noted  . GERD (gastroesophageal reflux disease) 04/29/2016  . Hyperglycemia 04/29/2016  . IPMN (intraductal papillary mucinous neoplasm) 04/23/2016  . Sepsis ([place]) 06/18/2013  . Localized swelling, mass and lump, neck 03/29/2013  . Sinus bradycardia 09/18/2012  . Fatigue 09/18/2012  . CKD (chronic kidney disease) stage 3, GFR 30-59 ml/min 09/18/2012    Past Medical History: Past Medical History:  Diagnosis Date  . Anemia of chronic disease 1985   hysterectomy fibroid tumors  . Bradycardia   . CKD (chronic kidney disease) stage 3, GFR 30-59 ml/min    sees dr Arty Baumgartner sees next in jan 2018  . Dysrhythmia 04/16/2016   bradycardia due to medication   . GERD (gastroesophageal reflux disease)   . Gout   . Headache   . History of kidney stones 06/2013  . Hyperparathyroidism ([place])    . Hypertension   . Itching    last year  . PONV (postoperative nausea and vomiting)   . Sleep apnea    no cpap machine. could not tolerate  . Stroke ([place]) 04/16/2016   TIA 1995  . Thyroid disease   . TIA (transient ischemic attack) 1996   x 1  . Vitamin D deficiency     Past Surgical History: Past Surgical History:  Procedure Laterality Date  . ABDOMINAL HYSTERECTOMY     complete  . APPENDECTOMY     with gallbladder  . BACK SURGERY     lower  . BREAST LUMPECTOMY Left x 2   many years apart, benign  . CHOLECYSTECTOMY    . CYSTOSCOPY WITH URETEROSCOPY AND STENT PLACEMENT Left 06/18/2013   Procedure: CYSTOSCOPY WITH Lef URETEROSCOPY AND Left STENT PLACEMENT;  Surgeon: Dutch Gray, MD;  Location: [facility];  Service: Urology;  Laterality: Left;  . EUS N/A 02/07/2016   Procedure: ESOPHAGEAL ENDOSCOPIC ULTRASOUND (EUS) RADIAL;  Surgeon: Arta Silence, MD;  Location: [facility];  Service: Endoscopy;  Laterality: N/A;  . EYE SURGERY Bilateral 2014   ioc for catracts   . PARATHYROID EXPLORATION    . WHIPPLE PROCEDURE N/A 04/23/2016   Procedure: WHIPPLE PROCEDURE;  Surgeon: Stark Klein, MD;  Location: [place];  Service: General;  Laterality: N/A;    Social History: Social History  Substance Use Topics  . Smoking status: Never Smoker  . Smokeless tobacco: Never Used  . Alcohol use No   Additional social history:   Please also refer to relevant sections of EMR.  Family History: Family History  Problem Relation Age of Onset  . Heart disease Mother   . Stroke Mother   . Cancer Father        colon  . Alzheimer's disease Sister   . Cancer Brother        brain  . Alzheimer's disease Sister    (If not completed, MUST add something in)  Allergies and Medications: Allergies  Allergen Reactions  . Codeine Other (See Comments)    headache  . Doxycycline Other (See Comments)    Pt didn't really remember about reaction.  . Gemfibrozil Other (See Comments)    Myalgia   .  Hydrocodone-Acetaminophen Other (See Comments)    Headache  . Lotemax [Loteprednol Etabonate]     burning  . Statins Other (See Comments)    Muscle weakness   No current facility-administered medications on file prior to encounter.    Current Outpatient Prescriptions on File Prior to Encounter  Medication Sig Dispense Refill  . acetaminophen (TYLENOL) 500 MG tablet Take 1,000 mg by mouth every 6 (six) hours as needed for moderate pain.    Marland Kitchen Alum & Mag Hydroxide-Simeth (GI COCKTAIL) SUSP suspension Take 30 mLs by mouth 3 (three) times daily as needed for indigestion. Shake well. (Patient taking differently: Take 30 mLs by mouth 2 (two) times daily as needed for indigestion. Shake well.) 500 mL 1  . Amino Acids-Protein Hydrolys (FEEDING SUPPLEMENT, PRO-STAT SUGAR FREE 64,) LIQD Take 30 mLs by mouth 3 (three) times daily. (Patient taking differently: Take 30 mLs by mouth daily as needed (for nutrient). ) 900 mL 0  . Artificial Tear Solution (GENTEAL TEARS OP) Apply 1 drop to eye 2 (two) times daily as needed (dry eyes).     Marland Kitchen azelastine (ASTELIN) 0.1 % nasal spray Place 1 spray into both nostrils at bedtime as needed for rhinitis. Use in each nostril as directed    . bisacodyl (DULCOLAX) 10 MG suppository Place 1 suppository (10 mg total) rectally daily. (Patient taking differently: Place 10 mg rectally daily as needed for mild constipation. ) 12 suppository 0  . calcitRIOL (ROCALTROL) 0.25 MCG capsule Take 0.25 mcg by mouth every Monday, Wednesday, and Friday. At night    . cinacalcet (SENSIPAR) 30 MG tablet Take 30 mg by mouth daily with supper.    . clobetasol (TEMOVATE) 0.05 % GEL Apply 1 application topically daily as needed (for itching).     . clopidogrel (PLAVIX) 75 MG tablet Take 75 mg by mouth every morning.    . diclofenac sodium (VOLTAREN) 1 % GEL Apply 2 g topically 4 (four) times daily as needed (for hand pain).     Marland Kitchen diphenhydramine-acetaminophen (TYLENOL PM) 25-500 MG TABS Take 2  tablets by mouth at bedtime as needed (for sleep).     Marland Kitchen escitalopram (LEXAPRO) 5 MG tablet Take 15 mg by mouth at bedtime.     . Febuxostat (ULORIC) 80 MG TABS Take 80 mg by mouth every morning.     . fenofibrate (TRICOR) 145 MG tablet Take 145 mg by mouth every morning.     . hydrALAZINE (APRESOLINE) 25 MG tablet Take 25 mg by mouth 3 (three) times daily.    . insulin aspart (NOVOLOG)  100 UNIT/ML injection Inject 0-9 Units into the skin 4 (four) times daily -  before meals and at bedtime. (Patient taking differently: Inject 2-3 Units into the skin 4 (four) times daily -  before meals and at bedtime. Per pt: BS 150 - 200 = 2 Units; BS >200 = 3 units.) 10 mL 11  . pantoprazole (PROTONIX) 40 MG tablet Take 1 tablet (40 mg total) by mouth 2 (two) times daily. 60 tablet 4  . Vitamin D, Ergocalciferol, (DRISDOL) 50000 UNITS CAPS capsule Take 50,000 Units by mouth every Wednesday. Wednesdays     . glimepiride (AMARYL) 2 MG tablet Take 1 tablet (2 mg total) by mouth daily with breakfast. Hold if blood glucose is <100 (Patient not taking: Reported on 09/03/2016) 30 tablet 0  . ondansetron (ZOFRAN-ODT) 4 MG disintegrating tablet Take 1 tablet (4 mg total) by mouth every 6 (six) hours as needed for nausea. (Patient not taking: Reported on 10/03/2016) 20 tablet 0  . polyethylene glycol (MIRALAX / GLYCOLAX) packet Take 17 g by mouth daily. (Patient not taking: Reported on 10/03/2016) 30 each 12  . traMADol (ULTRAM) 50 MG tablet Take 2 tablets (100 mg total) by mouth every 12 (twelve) hours as needed for moderate pain. (Patient not taking: Reported on 10/03/2016) 90 tablet 1    Objective: BP (!) 179/86   Pulse 69   Temp 97.9 F (36.6 C) (Oral)   Resp 19   Ht 5\' 4"  (1.626 m)   Wt 129 lb (58.5 kg)   SpO2 99%   BMI 22.14 kg/m  Exam: General: NAD, rests comfortably in bed, pleasant, alert Eyes: PERRL, EOMI, no conjunctival pallor or injection, no scleral icterus ENTM: mucous membranes dry, no pharyngeal  erythema or exudate Neck: no LAD, supple Cardiovascular: RRR, no m/r/g Respiratory: CTA bil, no W/R/R Gastrointestinal: soft, mild diffuse tenderness to deep palpation without rebound or guarding, no hepatosplenomegaly appreciated MSK: moves 4 extremities equally Derm: no rashes or lesions Neuro: CN II-XII grossly intact Psych: AAOx3, appropriate affect, pleasant  Labs and Imaging: CBC BMET   Recent Labs Lab 10/03/16 1314  WBC 6.9  HGB 9.4*  HCT 28.6*  PLT 307    Recent Labs Lab 10/03/16 1314  NA 139  K 3.1*  CL 111  CO2 17*  BUN 77*  CREATININE 5.31*  GLUCOSE 166*  CALCIUM 8.7*     Dg Chest 2 View 10/03/2016 FINDINGS: The heart size and mediastinal contours are within normal limits. Both lungs are clear. The visualized skeletal structures are unremarkable.  IMPRESSION: No active cardiopulmonary disease.     Everrett Coombe, MD 10/03/2016, 8:06 PM PGY-2, [place] [facility] pager: [phone], text pages welcome

## 2016-10-03 NOTE — ED Notes (Signed)
Pt is here for abnormal lab values from outside provider. Pt did complain about runny stools that have become more frequent.

## 2016-10-03 NOTE — ED Provider Notes (Signed)
[facility] DEPT Provider Note   CSN: [id number] Arrival date & time: 10/03/16  1302     History   Chief Complaint Chief Complaint  Patient presents with  . Abnormal Lab    HPI Kim Burns is a 81 y.o. female. With history of DM after partial pancreatectomy for intraductal papillary mucinous neoplas  March 2018, CKD, HTN, CVA who presents with elevated creatinine. Patient was found to have elevated labs at routine lab work done by nephrologist for her seat [place]. Patient reports that she has had generalized weakness and fatigue since her Whipple procedure in March. She also has had approximately 40 pound weight loss. She has had 4-5 episodes of diarrhea occurring daily since her surgery. She denies any nausea, vomiting, chest pain or shortness of breath. She denies any urinary symptoms. She has a history of kidney stones, however reports that she does not have any similar symptoms to previous episodes.  HPI  Past Medical History:  Diagnosis Date  . Anemia of chronic disease 1985   hysterectomy fibroid tumors  . Bradycardia   . CKD (chronic kidney disease) stage 3, GFR 30-59 ml/min    sees dr Arty Baumgartner sees next in jan 2018  . Dysrhythmia 04/16/2016   bradycardia due to medication   . GERD (gastroesophageal reflux disease)   . Gout   . Headache   . History of kidney stones 06/2013  . Hyperparathyroidism ([place])   . Hypertension   . Itching    last year  . PONV (postoperative nausea and vomiting)   . Sleep apnea    no cpap machine. could not tolerate  . Stroke ([place]) 04/16/2016   TIA 1995  . Thyroid disease   . TIA (transient ischemic attack) 1996   x 1  . Vitamin D deficiency     Patient Active Problem List   Diagnosis Date Noted  . Acute renal failure (ARF) ([place]) 10/03/2016  . GERD (gastroesophageal reflux disease) 04/29/2016  . Hyperglycemia 04/29/2016  . IPMN (intraductal papillary mucinous neoplasm) 04/23/2016  . Sepsis ([place]) 06/18/2013  . Localized  swelling, mass and lump, neck 03/29/2013  . Sinus bradycardia 09/18/2012  . Fatigue 09/18/2012  . CKD (chronic kidney disease) stage 3, GFR 30-59 ml/min 09/18/2012    Past Surgical History:  Procedure Laterality Date  . ABDOMINAL HYSTERECTOMY     complete  . APPENDECTOMY     with gallbladder  . BACK SURGERY     lower  . BREAST LUMPECTOMY Left x 2   many years apart, benign  . CHOLECYSTECTOMY    . CYSTOSCOPY WITH URETEROSCOPY AND STENT PLACEMENT Left 06/18/2013   Procedure: CYSTOSCOPY WITH Lef URETEROSCOPY AND Left STENT PLACEMENT;  Surgeon: Dutch Gray, MD;  Location: [facility];  Service: Urology;  Laterality: Left;  . EUS N/A 02/07/2016   Procedure: ESOPHAGEAL ENDOSCOPIC ULTRASOUND (EUS) RADIAL;  Surgeon: Arta Silence, MD;  Location: [facility];  Service: Endoscopy;  Laterality: N/A;  . EYE SURGERY Bilateral 2014   ioc for catracts   . PARATHYROID EXPLORATION    . WHIPPLE PROCEDURE N/A 04/23/2016   Procedure: WHIPPLE PROCEDURE;  Surgeon: Stark Klein, MD;  [facility];  Service: General;  Laterality: N/A;    OB History    No data available       Home Medications    Prior to Admission medications   Medication Sig Start Date End Date Taking? Authorizing Provider  acetaminophen (TYLENOL) 500 MG tablet Take 1,000 mg by mouth every 6 (six) hours as  needed for moderate pain.   Yes [provider]  Alum & Mag Hydroxide-Simeth (GI COCKTAIL) SUSP suspension Take 30 mLs by mouth 3 (three) times daily as needed for indigestion. Shake well. Patient taking differently: Take 30 mLs by mouth 2 (two) times daily as needed for indigestion. Shake well. 05/03/16  Yes Stark Klein, MD  Amino Acids-Protein Hydrolys (FEEDING SUPPLEMENT, PRO-STAT SUGAR FREE 64,) LIQD Take 30 mLs by mouth 3 (three) times daily. Patient taking differently: Take 30 mLs by mouth daily as needed (for nutrient).  05/06/16  Yes Stark Klein, MD  Artificial Tear Solution (GENTEAL TEARS OP) Apply 1 drop to eye 2  (two) times daily as needed (dry eyes).    Yes [provider]  azelastine (ASTELIN) 0.1 % nasal spray Place 1 spray into both nostrils at bedtime as needed for rhinitis. Use in each nostril as directed   Yes [provider]  bisacodyl (DULCOLAX) 10 MG suppository Place 1 suppository (10 mg total) rectally daily. Patient taking differently: Place 10 mg rectally daily as needed for mild constipation.  05/04/16  Yes Stark Klein, MD  calcitRIOL (ROCALTROL) 0.25 MCG capsule Take 0.25 mcg by mouth every Monday, Wednesday, and Friday. At night   Yes [provider]  Cholecalciferol (VITAMIN D3) 400 units CAPS Take 800 Units by mouth daily.    Yes [provider]  cinacalcet (SENSIPAR) 30 MG tablet Take 30 mg by mouth daily with supper.   Yes [provider]  clobetasol (TEMOVATE) 0.05 % GEL Apply 1 application topically daily as needed (for itching).    Yes [provider]  clopidogrel (PLAVIX) 75 MG tablet Take 75 mg by mouth every morning.   Yes [provider]  diclofenac sodium (VOLTAREN) 1 % GEL Apply 2 g topically 4 (four) times daily as needed (for hand pain).    Yes [provider]  diphenhydramine-acetaminophen (TYLENOL PM) 25-500 MG TABS Take 2 tablets by mouth at bedtime as needed (for sleep).    Yes [provider]  escitalopram (LEXAPRO) 5 MG tablet Take 15 mg by mouth at bedtime.    Yes [provider]  Febuxostat (ULORIC) 80 MG TABS Take 80 mg by mouth every morning.    Yes [provider]  fenofibrate (TRICOR) 145 MG tablet Take 145 mg by mouth every morning.    Yes [provider]  hydrALAZINE (APRESOLINE) 25 MG tablet Take 25 mg by mouth 3 (three) times daily.   Yes [provider]  insulin aspart (NOVOLOG) 100 UNIT/ML injection Inject 0-9 Units into the skin 4 (four) times daily -  before meals and at bedtime. Patient taking differently: Inject 2-3 Units into the skin 4  (four) times daily -  before meals and at bedtime. Per pt: BS 150 - 200 = 2 Units; BS >200 = 3 units. 05/06/16  Yes Stark Klein, MD  Pancrelipase, Lip-Prot-Amyl, (CREON PO) Take 1-2 capsules by mouth 3 (three) times daily as needed (for loose stool). Take 2 caps before meals; take 1 caps before snack.   Yes [provider]  pantoprazole (PROTONIX) 40 MG tablet Take 1 tablet (40 mg total) by mouth 2 (two) times daily. 05/03/16  Yes Stark Klein, MD  Polyethyl Glycol-Propyl Glycol (SYSTANE) 0.4-0.3 % SOLN Place 1 drop into both eyes 2 (two) times daily.   Yes [provider]  Vitamin D, Ergocalciferol, (DRISDOL) 50000 UNITS CAPS capsule Take 50,000 Units by mouth every Wednesday. Wednesdays    Yes [provider]  glimepiride (AMARYL) 2 MG tablet Take 1 tablet (2 mg total) by mouth daily with breakfast. Hold if blood glucose is <100 Patient not taking: Reported on 09/03/2016 05/06/16   Donne Hazel, MD  ondansetron (ZOFRAN-ODT) 4 MG disintegrating tablet Take 1 tablet (4 mg total) by mouth every 6 (six) hours as needed for nausea. Patient not taking: Reported on 10/03/2016 05/03/16   Stark Klein, MD  polyethylene glycol [facility] / Floria Raveling) packet Take 17 g by mouth daily. Patient not taking: Reported on 10/03/2016 05/04/16   Stark Klein, MD  traMADol (ULTRAM) 50 MG tablet Take 2 tablets (100 mg total) by mouth every 12 (twelve) hours as needed for moderate pain. Patient not taking: Reported on 10/03/2016 05/03/16   Stark Klein, MD    Family History Family History  Problem Relation Age of Onset  . Heart disease Mother   . Stroke Mother   . Cancer Father        colon  . Alzheimer's disease Sister   . Cancer Brother        brain  . Alzheimer's disease Sister     Social History Social History  Substance Use Topics  . Smoking status: Never Smoker  . Smokeless tobacco: Never Used  . Alcohol use No     Allergies   Codeine; Doxycycline; Gemfibrozil;  Hydrocodone-acetaminophen; Lotemax [loteprednol etabonate]; and Statins   Review of Systems Review of Systems  Constitutional: Positive for activity change and unexpected weight change (40 pound weight loss since March 2018 (after whipple)). Negative for chills and fever.  HENT: Negative for ear pain and sore throat.   Eyes: Negative for pain and visual disturbance.  Respiratory: Negative for cough and shortness of breath.   Cardiovascular: Negative for chest pain and palpitations.  Gastrointestinal: Positive for abdominal pain and diarrhea. Negative for vomiting. Constipation: 4-5 episodes daily.  Genitourinary: Negative for dysuria and hematuria.  Musculoskeletal: Negative for arthralgias and back pain.  Skin: Negative for color change and rash.  Neurological: Positive for headaches. Negative for seizures, syncope and facial asymmetry.  All other systems reviewed and are negative.    Physical Exam Updated Vital Signs BP (!) 140/116 (BP Location: Right Arm)   Pulse 71   Temp 98 F (36.7 C) (Oral)   Resp 18   Ht 5\' 4"  (1.626 m)   Wt 56.7 kg (125 lb)   SpO2 100%   BMI 21.46 kg/m   Physical Exam  Constitutional: She is oriented to person, place, and time. She appears well-developed and well-nourished. No distress.  HENT:  Head: Normocephalic and atraumatic.  Eyes: Conjunctivae are normal.  Neck: Neck supple.  Cardiovascular: Normal rate and regular rhythm.   No murmur heard. Pulmonary/Chest: Effort normal and breath sounds normal. No respiratory distress.  Abdominal: Soft. There is no tenderness.  Musculoskeletal: She exhibits no edema.  Neurological: She is alert and oriented to person, place, and time.  Skin: Skin is warm and dry.  Psychiatric: She has a normal mood and affect.  Nursing note and vitals reviewed.    ED Treatments / Results  Labs (all labs ordered are listed, but only abnormal results are displayed) Labs Reviewed  BASIC METABOLIC PANEL - Abnormal;  Notable for the following:       Result Value   Potassium 3.1 (*)    CO2 17 (*)    Glucose, Bld 166 (*)    BUN 77 (*)    Creatinine, Ser 5.31 (*)    Calcium 8.7 (*)  GFR calc non Af Amer 7 (*)    GFR calc Af Amer 8 (*)    All other components within normal limits  CBC - Abnormal; Notable for the following:    RBC 3.26 (*)    Hemoglobin 9.4 (*)    HCT 28.6 (*)    All other components within normal limits  URINALYSIS, ROUTINE W REFLEX MICROSCOPIC - Abnormal; Notable for the following:    Color, Urine STRAW (*)    Hgb urine dipstick SMALL (*)    Leukocytes, UA SMALL (*)    Bacteria, UA RARE (*)    Squamous Epithelial / LPF 0-5 (*)    All other components within normal limits  BASIC METABOLIC PANEL - Abnormal; Notable for the following:    Potassium 2.7 (*)    Chloride 113 (*)    CO2 18 (*)    Glucose, Bld 202 (*)    BUN 74 (*)    Creatinine, Ser 4.89 (*)    Calcium 8.7 (*)    GFR calc non Af Amer 8 (*)    GFR calc Af Amer 9 (*)    All other components within normal limits  MAGNESIUM - Abnormal; Notable for the following:    Magnesium 1.6 (*)    All other components within normal limits  PHOSPHORUS - Abnormal; Notable for the following:    Phosphorus 5.5 (*)    All other components within normal limits  CBC  RENAL FUNCTION PANEL  CREATININE, URINE, RANDOM  OSMOLALITY, URINE  SODIUM, URINE, RANDOM    EKG  EKG Interpretation None       Radiology Dg Chest 2 View  Result Date: 10/03/2016 CLINICAL DATA:  Fatigue. EXAM: CHEST  2 VIEW COMPARISON:  May 22, 2016 FINDINGS: The heart size and mediastinal contours are within normal limits. Both lungs are clear. The visualized skeletal structures are unremarkable. IMPRESSION: No active cardiopulmonary disease. Electronically Signed   By: Dorise Bullion III M.D   On: 10/03/2016 17:36   US Renal  Result Date: 10/04/2016 CLINICAL DATA:  Acute onset of renal failure.  Initial encounter. EXAM: RENAL / URINARY TRACT  ULTRASOUND COMPLETE COMPARISON:  Renal ultrasound performed 09/05/2014, and MRI of the abdomen performed 11/20/2015 FINDINGS: Right Kidney: Length: 11.3 cm. Diffusely increased parenchymal echogenicity is noted. Scattered cysts are noted about the right kidney. No hydronephrosis visualized. Left Kidney: Length: 12.3 cm. Diffusely increased parenchymal echogenicity is noted. Scattered cysts are noted about the left kidney. No hydronephrosis visualized. Bladder: Appears normal for degree of bladder distention. IMPRESSION: 1. No evidence of hydronephrosis. 2. Diffusely increased parenchymal echogenicity raises concern for medical renal disease. 3. Scattered bilateral renal cysts again noted. Electronically Signed   By: Garald Balding M.D.   On: 10/04/2016 00:11    Procedures Procedures (including critical care time)  Medications Ordered in ED Medications  cholecalciferol (VITAMIN D) tablet 1,000 Units (1,000 Units Oral Given 10/03/16 2311)  lipase/protease/amylase (CREON) capsule 12,000 Units (not administered)  pantoprazole (PROTONIX) EC tablet 20 mg (20 mg Oral Given 10/03/16 2311)  escitalopram (LEXAPRO) tablet 15 mg (15 mg Oral Given 10/03/16 2300)  clopidogrel (PLAVIX) tablet 75 mg (not administered)  heparin injection 5,000 Units (5,000 Units Subcutaneous Given 10/03/16 2312)  0.9 %  sodium chloride infusion ( Intravenous New Bag/Given 10/03/16 2314)  insulin aspart (novoLOG) injection 0-9 Units (not administered)  hydrALAZINE (APRESOLINE) injection 2 mg (not administered)  potassium chloride (K-DUR,KLOR-CON) CR tablet 30 mEq (not administered)  sodium chloride 0.9 % bolus 1,000  mL (0 mLs Intravenous Stopped 10/03/16 2123)  potassium chloride (K-DUR,KLOR-CON) CR tablet 10 mEq (10 mEq Oral Given 10/03/16 2133)     Initial Impression / Assessment and Plan / ED Course  I have reviewed the triage vital signs and the nursing notes.  Pertinent labs & imaging results that were available during my care  of the patient were reviewed by me and considered in my medical decision making (see chart for details).    Patient with history of CKD, DM, HTN, CVA, pancreatic mass s/p Whipple who presents with elevated creatinine. Patient arrived HDS, in no acute distress. Exam as above.  Cr elevated to 5.31 (baseline 1.8-1.9), BUN elevated to 77. Unclear etiology of ARF - although patient has had diarrhea daily since operation in March, perhaps some pre-renal. Given 1L NS bolus. Discussed with family medicine for admission for further workup. Patient in agreement with plan at time of admission   Patient and plan of care discussed with Attending physician, Dr. Laverta Baltimore.    Final Clinical Impressions(s) / ED Diagnoses   Final diagnoses:  Acute renal failure, unspecified acute renal failure type ([place])  Uremia  Acute renal failure (ARF) [facility])    New Prescriptions Current Discharge Medication List       Arnetha Massy, MD 10/04/16 Baldemar Lenis, MD 10/04/16 [phone]

## 2016-10-03 NOTE — ED Triage Notes (Signed)
Pt states her MD called and told her that her creatanine was 5. Usually runs around 3.3. Pt reports feeling generalized fatigue and some intermittent abd pain. Pt alert and oriented. Denies dysuria.

## 2016-10-04 DIAGNOSIS — N179 Acute kidney failure, unspecified: Principal | ICD-10-CM

## 2016-10-04 LAB — GLUCOSE, CAPILLARY
GLUCOSE-CAPILLARY: 71 mg/dL (ref 65–99)
GLUCOSE-CAPILLARY: 222 mg/dL — AB (ref 65–99)
Glucose-Capillary: 101 mg/dL — ABNORMAL HIGH (ref 65–99)
Glucose-Capillary: 125 mg/dL — ABNORMAL HIGH (ref 65–99)

## 2016-10-04 LAB — RENAL FUNCTION PANEL
ANION GAP: 9 (ref 5–15)
Albumin: 3.1 g/dL — ABNORMAL LOW (ref 3.5–5.0)
BUN: 71 mg/dL — ABNORMAL HIGH (ref 6–20)
CALCIUM: 8.5 mg/dL — AB (ref 8.9–10.3)
CHLORIDE: 114 mmol/L — AB (ref 101–111)
CO2: 17 mmol/L — ABNORMAL LOW (ref 22–32)
Creatinine, Ser: 4.72 mg/dL — ABNORMAL HIGH (ref 0.44–1.00)
GFR calc Af Amer: 9 mL/min — ABNORMAL LOW (ref >60)
GFR calc non Af Amer: 8 mL/min — ABNORMAL LOW (ref >60)
GLUCOSE: 219 mg/dL — AB (ref 65–99)
POTASSIUM: 3.2 mmol/L — AB (ref 3.5–5.1)
Phosphorus: 5.5 mg/dL — ABNORMAL HIGH (ref 2.5–4.6)
SODIUM: 140 mmol/L (ref 135–145)

## 2016-10-04 LAB — BASIC METABOLIC PANEL
ANION GAP: 8 (ref 5–15)
BUN: 62 mg/dL — ABNORMAL HIGH (ref 6–20)
CO2: 18 mmol/L — ABNORMAL LOW (ref 22–32)
Calcium: 9.1 mg/dL (ref 8.9–10.3)
Chloride: 114 mmol/L — ABNORMAL HIGH (ref 101–111)
Creatinine, Ser: 4.39 mg/dL — ABNORMAL HIGH (ref 0.44–1.00)
GFR, EST AFRICAN AMERICAN: 10 mL/min — AB (ref >60)
GFR, EST NON AFRICAN AMERICAN: 9 mL/min — AB (ref >60)
Glucose, Bld: 93 mg/dL (ref 65–99)
POTASSIUM: 3.4 mmol/L — AB (ref 3.5–5.1)
SODIUM: 140 mmol/L (ref 135–145)

## 2016-10-04 LAB — CBC
HCT: 26.6 % — ABNORMAL LOW (ref 36.0–46.0)
HEMOGLOBIN: 8.8 g/dL — AB (ref 12.0–15.0)
MCH: 29.1 pg (ref 26.0–34.0)
MCHC: 33.1 g/dL (ref 30.0–36.0)
MCV: 88.1 fL (ref 78.0–100.0)
Platelets: 262 10*3/uL (ref 150–400)
RBC: 3.02 MIL/uL — ABNORMAL LOW (ref 3.87–5.11)
RDW: 15.4 % (ref 11.5–15.5)
WBC: 7.4 10*3/uL (ref 4.0–10.5)

## 2016-10-04 LAB — CREATININE, URINE, RANDOM: Creatinine, Urine: 31.63 mg/dL

## 2016-10-04 LAB — SODIUM, URINE, RANDOM: SODIUM UR: 68 mmol/L

## 2016-10-04 LAB — OSMOLALITY, URINE: Osmolality, Ur: 277 mOsm/kg — ABNORMAL LOW (ref 300–900)

## 2016-10-04 MED ORDER — POTASSIUM CHLORIDE CRYS ER 20 MEQ PO TBCR
20.0000 meq | EXTENDED_RELEASE_TABLET | Freq: Once | ORAL | Status: AC
  Administered 2016-10-04: 20 meq via ORAL
  Filled 2016-10-04: qty 1

## 2016-10-04 MED ORDER — PANCRELIPASE (LIP-PROT-AMYL) 36000-114000 UNITS PO CPEP
72000.0000 [IU] | ORAL_CAPSULE | Freq: Three times a day (TID) | ORAL | Status: DC
  Administered 2016-10-04 – 2016-10-07 (×11): 72000 [IU] via ORAL
  Filled 2016-10-04 (×11): qty 2

## 2016-10-04 MED ORDER — PREMIER PROTEIN SHAKE
11.0000 [oz_av] | Freq: Two times a day (BID) | ORAL | Status: DC
  Administered 2016-10-05 – 2016-10-07 (×5): 11 [oz_av] via ORAL
  Filled 2016-10-04 (×10): qty 325.31

## 2016-10-04 MED ORDER — POTASSIUM CHLORIDE CRYS ER 10 MEQ PO TBCR
30.0000 meq | EXTENDED_RELEASE_TABLET | Freq: Once | ORAL | Status: AC
  Administered 2016-10-04: 30 meq via ORAL

## 2016-10-04 MED ORDER — MAGNESIUM SULFATE IN D5W 1-5 GM/100ML-% IV SOLN
1.0000 g | Freq: Once | INTRAVENOUS | Status: AC
  Administered 2016-10-04: 1 g via INTRAVENOUS
  Filled 2016-10-04: qty 100

## 2016-10-04 MED ORDER — POTASSIUM CHLORIDE CRYS ER 20 MEQ PO TBCR: 20.0000 meq | EXTENDED_RELEASE_TABLET | Freq: Once | ORAL | Status: DC

## 2016-10-04 NOTE — Consult Note (Signed)
[facility] Consultation Note Requesting Physician:  Dr. Burr Medico Primary Nephrologist: Dr. Marval Regal Reason for Consult:  Acute renal failure on CKD IV  HPI: The patient is a 81 y.o. year-old female with history of pancreatic mass s/p resection 04/26/2016, diabetes, HTN, hx TIA 1995 on plavix, Anxiety, and GERD that presents to the ED for elevated creatinine on outpatient lab work.  Patient follows with Dr. Marval Regal from [facility] due to CKD IV.  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  Patient has reported generalized fatigue and weakness with chronic diarrhea since the procedure. Patient presented to the ED with creatinine of 5.31 on 8/16.  Patient received gentle hydration and currently creatinine is 4.71.  Creatinine, Ser  Date/Time Value Ref Range Status  10/04/2016 03:04 AM 4.72 (H) 0.44 - 1.00 mg/dL Final  10/03/2016 09:52 PM 4.89 (H) 0.44 - 1.00 mg/dL Final  10/03/2016 01:14 PM 5.31 (H) 0.44 - 1.00 mg/dL Final  05/06/2016 06:04 AM 1.86 (H) 0.44 - 1.00 mg/dL Final  05/05/2016 05:25 AM 1.96 (H) 0.44 - 1.00 mg/dL Final  05/04/2016 06:04 AM 1.89 (H) 0.44 - 1.00 mg/dL Final  05/03/2016 05:37 AM 1.68 (H) 0.44 - 1.00 mg/dL Final  05/02/2016 05:09 AM 1.80 (H) 0.44 - 1.00 mg/dL Final  04/30/2016 02:17 AM 1.74 (H) 0.44 - 1.00 mg/dL Final  04/28/2016 04:21 AM 1.65 (H) 0.44 - 1.00 mg/dL Final  04/27/2016 02:20 AM 1.75 (H) 0.44 - 1.00 mg/dL Final  04/26/2016 04:25 AM 2.11 (H) 0.44 - 1.00 mg/dL Final  04/25/2016 04:05 AM 2.72 (H) 0.44 - 1.00 mg/dL Final  04/24/2016 03:59 AM 2.75 (H) 0.44 - 1.00 mg/dL Final  04/16/2016 02:44 PM 2.94 (H) 0.44 - 1.00 mg/dL Final  06/21/2013 04:50 AM 2.06 (H) 0.50 - 1.10 mg/dL Final  06/20/2013 03:40 AM 2.54 (H) 0.50 - 1.10 mg/dL Final  06/19/2013 02:12 PM  2.96 (H) 0.50 - 1.10 mg/dL Final  06/19/2013 07:45 AM 3.10 (H) 0.50 - 1.10 mg/dL Final  06/19/2013 03:31 AM 3.28 (H) 0.50 - 1.10 mg/dL Final  06/18/2013 06:49 PM 3.57 (H) 0.50 - 1.10 mg/dL Final  06/18/2013 04:42 PM 3.89 (H) 0.50 - 1.10 mg/dL Final  06/18/2013 02:35 PM 4.55 (H) 0.50 - 1.10 mg/dL Final  07/28/2011 12:28 PM 1.58 (H) 0.50 - 1.10 mg/dL Final  09/27/2008 11:45 AM 1.62 (H) 0.4 - 1.2 mg/dL Final  02/22/2008 01:18 PM 1.30 (H) 0.4 - 1.2 mg/dL Final    Past Medical History:  Past Medical History:  Diagnosis Date  . Anemia of chronic disease 1985   hysterectomy fibroid tumors  . Bradycardia   . CKD (chronic kidney disease) stage 3, GFR 30-59 ml/min    sees dr Arty Baumgartner sees next in jan 2018  . Dysrhythmia 04/16/2016   bradycardia due to medication   . GERD (gastroesophageal reflux disease)   . Gout   . Headache   . History of kidney stones 06/2013  . Hyperparathyroidism ([place])   . Hypertension   . Itching    last year  . PONV (postoperative nausea and vomiting)   . Sleep apnea    no cpap machine. could not tolerate  . Stroke ([place]) 04/16/2016   TIA 1995  . Thyroid disease   . TIA (transient ischemic attack) 1996   x 1  . Vitamin D deficiency  Past Surgical History:  Past Surgical History:  Procedure Laterality Date  . ABDOMINAL HYSTERECTOMY     complete  . APPENDECTOMY     with gallbladder  . BACK SURGERY     lower  . BREAST LUMPECTOMY Left x 2   many years apart, benign  . CHOLECYSTECTOMY    . CYSTOSCOPY WITH URETEROSCOPY AND STENT PLACEMENT Left 06/18/2013   Procedure: CYSTOSCOPY WITH Lef URETEROSCOPY AND Left STENT PLACEMENT;  Surgeon: Dutch Gray, MD;  Location: [facility];  Service: Urology;  Laterality: Left;  . EUS N/A 02/07/2016   Procedure: ESOPHAGEAL ENDOSCOPIC ULTRASOUND (EUS) RADIAL;  Surgeon: Arta Silence, MD;  Location: [facility];  Service: Endoscopy;  Laterality: N/A;  . EYE SURGERY Bilateral 2014   ioc for catracts   . PARATHYROID  EXPLORATION    . WHIPPLE PROCEDURE N/A 04/23/2016   Procedure: WHIPPLE PROCEDURE;  Surgeon: Stark Klein, MD;  Location: [facility];  Service: General;  Laterality: N/A;    Family History:  Family History  Problem Relation Age of Onset  . Heart disease Mother   . Stroke Mother   . Cancer Father        colon  . Alzheimer's disease Sister   . Cancer Brother        brain  . Alzheimer's disease Sister    Social History:  reports that she has never smoked. She has never used smokeless tobacco. She reports that she does not drink alcohol or use drugs.  Allergies:  Allergies  Allergen Reactions  . Codeine Other (See Comments)    headache  . Doxycycline Other (See Comments)    Pt didn't really remember about reaction.  . Gemfibrozil Other (See Comments)    Myalgia   . Hydrocodone-Acetaminophen Other (See Comments)    Headache  . Lotemax [Loteprednol Etabonate]     burning  . Statins Other (See Comments)    Muscle weakness    Home medications: Prior to Admission medications   Medication Sig Start Date End Date Taking? Authorizing Provider  acetaminophen (TYLENOL) 500 MG tablet Take 1,000 mg by mouth every 6 (six) hours as needed for moderate pain.   Yes [provider]  Alum & Mag Hydroxide-Simeth (GI COCKTAIL) SUSP suspension Take 30 mLs by mouth 3 (three) times daily as needed for indigestion. Shake well. Patient taking differently: Take 30 mLs by mouth 2 (two) times daily as needed for indigestion. Shake well. 05/03/16  Yes Stark Klein, MD  Amino Acids-Protein Hydrolys (FEEDING SUPPLEMENT, PRO-STAT SUGAR FREE 64,) LIQD Take 30 mLs by mouth 3 (three) times daily. Patient taking differently: Take 30 mLs by mouth daily as needed (for nutrient).  05/06/16  Yes Stark Klein, MD  Artificial Tear Solution (GENTEAL TEARS OP) Apply 1 drop to eye 2 (two) times daily as needed (dry eyes).    Yes [provider]  azelastine (ASTELIN) 0.1 % nasal spray Place 1 spray into both  nostrils at bedtime as needed for rhinitis. Use in each nostril as directed   Yes [provider]  bisacodyl (DULCOLAX) 10 MG suppository Place 1 suppository (10 mg total) rectally daily. Patient taking differently: Place 10 mg rectally daily as needed for mild constipation.  05/04/16  Yes Stark Klein, MD  calcitRIOL (ROCALTROL) 0.25 MCG capsule Take 0.25 mcg by mouth every Monday, Wednesday, and Friday. At night   Yes [provider]  Cholecalciferol (VITAMIN D3) 400 units CAPS Take 800 Units by mouth daily.    Yes [provider]  cinacalcet (SENSIPAR) 30 MG tablet Take 30 mg by mouth daily with supper.   Yes [provider]  clobetasol (TEMOVATE) 0.05 % GEL Apply 1 application topically daily as needed (for itching).    Yes [provider]  clopidogrel (PLAVIX) 75 MG tablet Take 75 mg by mouth every morning.   Yes [provider]  diclofenac sodium (VOLTAREN) 1 % GEL Apply 2 g topically 4 (four) times daily as needed (for hand pain).    Yes [provider]  diphenhydramine-acetaminophen (TYLENOL PM) 25-500 MG TABS Take 2 tablets by mouth at bedtime as needed (for sleep).    Yes [provider]  escitalopram (LEXAPRO) 5 MG tablet Take 15 mg by mouth at bedtime.    Yes [provider]  Febuxostat (ULORIC) 80 MG TABS Take 80 mg by mouth every morning.    Yes [provider]  fenofibrate (TRICOR) 145 MG tablet Take 145 mg by mouth every morning.    Yes [provider]  hydrALAZINE (APRESOLINE) 25 MG tablet Take 25 mg by mouth 3 (three) times daily.   Yes [provider]  insulin aspart (NOVOLOG) 100 UNIT/ML injection Inject 0-9 Units into the skin 4 (four) times daily -  before meals and at bedtime. Patient taking differently: Inject 2-3 Units into the skin 4 (four) times daily -  before meals and at bedtime. Per pt: BS 150 - 200 = 2 Units; BS >200 = 3 units. 05/06/16  Yes Stark Klein, MD   Pancrelipase, Lip-Prot-Amyl, (CREON PO) Take 1-2 capsules by mouth 3 (three) times daily as needed (for loose stool). Take 2 caps before meals; take 1 caps before snack.   Yes [provider]  pantoprazole (PROTONIX) 40 MG tablet Take 1 tablet (40 mg total) by mouth 2 (two) times daily. 05/03/16  Yes Stark Klein, MD  Polyethyl Glycol-Propyl Glycol (SYSTANE) 0.4-0.3 % SOLN Place 1 drop into both eyes 2 (two) times daily.   Yes [provider]  Vitamin D, Ergocalciferol, (DRISDOL) 50000 UNITS CAPS capsule Take 50,000 Units by mouth every Wednesday. Wednesdays    Yes [provider]  glimepiride (AMARYL) 2 MG tablet Take 1 tablet (2 mg total) by mouth daily with breakfast. Hold if blood glucose is <100 Patient not taking: Reported on 09/03/2016 05/06/16   Donne Hazel, MD  ondansetron (ZOFRAN-ODT) 4 MG disintegrating tablet Take 1 tablet (4 mg total) by mouth every 6 (six) hours as needed for nausea. Patient not taking: Reported on 10/03/2016 05/03/16   Stark Klein, MD  polyethylene glycol [facility]) packet Take 17 g by mouth daily. Patient not taking: Reported on 10/03/2016 05/04/16   Stark Klein, MD  traMADol (ULTRAM) 50 MG tablet Take 2 tablets (100 mg total) by mouth every 12 (twelve) hours as needed for moderate pain. Patient not taking: Reported on 10/03/2016 05/03/16   Stark Klein, MD    Inpatient medications: . cholecalciferol  1,000 Units Oral Daily  . clopidogrel  75 mg Oral q morning - 10a  . escitalopram  15 mg Oral QHS  . heparin  5,000 Units Subcutaneous Q8H  . insulin aspart  0-9 Units Subcutaneous TID WC  . lipase/protease/amylase  72,000 Units Oral TID WC  . pantoprazole  20 mg Oral Daily    Review of Systems Gen:  Denies headache, fever, chills, sweats.  No weight loss. HEENT:  No visual change, sore throat, difficulty swallowing. Resp:  No difficulty breathing, DOE.  No cough or hemoptysis. Cardiac:  No chest pain, orthopnea, PND.   Denies edema. GI:   Transient abdominal pain, has diarrhea  No nausea, vomiting.  No constipation. GU:  Denies difficulty or change in voiding.  No change in urine color.     MS:  Denies joint pain or swelling.   Derm:  Denies skin rash or itching.  No chronic skin conditions.  Neuro:   Denies focal weakness, memory problems, hx stroke or TIA.   Psych:  Denies symptoms of depression of anxiety.  No hallucination.    Physical Exam:  Blood pressure 122/66, pulse 69, temperature 98.4 F (36.9 C), temperature source Oral, resp. rate 15, height 5\' 4"  (1.626 m), weight 125 lb (56.7 kg), SpO2 99 %.  Gen: Appears comfortable, laying in bed Lines/tubes: Skin: no rash, cyanosis Neck: no JVD, no bruits or LAN Chest: clear to auscultation bilaterally, no wheezes, rales or rhonci Heart: regular, no rub or gallop Abdomen: soft, supra pubic tender Ext: no lower extremity edema Neuro: alert, Ox3, no focal deficit Heme/Lymph: no bruising or LAN  Labs: Renal Panel:  Recent Labs Lab 10/03/16 1314 10/03/16 2152 10/04/16 0304  NA 139 140 140  K 3.1* 2.7* 3.2*  CL 111 113* 114*  CO2 17* 18* 17*  GLUCOSE 166* 202* 219*  BUN 77* 74* 71*  CREATININE 5.31* 4.89* 4.72*  CALCIUM 8.7* 8.7* 8.5*  PHOS  --  5.5* 5.5*   Liver Function Tests:  Recent Labs Lab 10/04/16 0304  ALBUMIN 3.1*   No results for input(s): LIPASE, AMYLASE in the last 168 hours. No results for input(s): AMMONIA in the last 168 hours. CBC:  Recent Labs Lab 10/03/16 1314 10/04/16 0304  WBC 6.9 7.4  HGB 9.4* 8.8*  HCT 28.6* 26.6*  MCV 87.7 88.1  PLT 307 262   PT/INR: @LABRCNTIP (inr:5) Cardiac Enzymes: )No results for input(s): CKTOTAL, CKMB, CKMBINDEX, TROPONINI in the last 168 hours. CBG:  Recent Labs Lab 10/04/16 0753  GLUCAP 71    Iron Studies: No results for input(s): IRON, TIBC, TRANSFERRIN, FERRITIN in the last 168 hours.  Xrays/Other Studies: Dg Chest 2 View  Result Date: 10/03/2016 CLINICAL  DATA:  Fatigue. EXAM: CHEST  2 VIEW COMPARISON:  May 22, 2016 FINDINGS: The heart size and mediastinal contours are within normal limits. Both lungs are clear. The visualized skeletal structures are unremarkable. IMPRESSION: No active cardiopulmonary disease. Electronically Signed   By: Dorise Bullion III M.D   On: 10/03/2016 17:36   US Renal  Result Date: 10/04/2016 CLINICAL DATA:  Acute onset of renal failure.  Initial encounter. EXAM: RENAL / URINARY TRACT ULTRASOUND COMPLETE COMPARISON:  Renal ultrasound performed 09/05/2014, and MRI of the abdomen performed 11/20/2015 FINDINGS: Right Kidney: Length: 11.3 cm. Diffusely increased parenchymal echogenicity is noted. Scattered cysts are noted about the right kidney. No hydronephrosis visualized. Left Kidney: Length: 12.3 cm. Diffusely increased parenchymal echogenicity is noted. Scattered cysts are noted about the left kidney. No hydronephrosis visualized. Bladder: Appears normal for degree of bladder distention. IMPRESSION: 1. No evidence of hydronephrosis. 2. Diffusely increased parenchymal echogenicity raises concern for medical renal disease. 3. Scattered bilateral renal cysts again noted. Electronically Signed   By: Garald Balding M.D.   On: 10/04/2016 00:11    Background:  Kim Burns is a 81 y.o. female presenting with acute renal failure noted on outpatient labs drawn yesterday . PMH is significant for pancreatic mass s/p resection 04/2016, diabetes, HTN, hx TIA 1995 on plavix, Anxiety, and GERD presents to the ED for  elevated creatinine on outpatient lab work.  Patient follows with Dr. Marval Regal from [facility] due to CKD IV.  Patient had a whipple procedure in march 2018  Patient presented to the ED with creatinine of 5.31 on 8/16.  Patient received gentle hydration and currently creatinine is 4.71.  Assessment/Recommendations  Acute renal failure on CKD IV Patient presents with creatinine of 5.3.  Last noted creatinine in EMR was  1.8, after whipple procedure. Prior to whipple procedure patient's creatinine was 2.7 which appears to be around her baseline.  Patient reports chronic diarrhea, generalized weakness and fatigue since her whipple procedure in march.  Patient is not on an Ace-I or ARB.  Blood pressures were stable on admission. Potassium was low at 2.7 and has been repleated, currently 3.2.  CO2 low at 17.  Renal ultrasound with no hydronephrosis.  Creatinine currently 4.7 with IV NS overnight.  With low potassium and CO2 and 5 month history of chronic daily diarrhea patient is likely chronically intravascularly depleated.  FENa greater than one.  Concerned for interstitial nephritis.  Will stop protonix now and on discharge.   -continue fluids -stop protonix -bladder scan  Hypokalemia/hypomagnesemia  Mag 1.6 and  K 3.2 Repleating per primary  Chronic Diarrhea stable: has 4-5 episodes daily since whipple surgery in march 2018.  Takes 2 creon tablets prior to meals, one creon tablet prior to snacks. - continue home creon  Diabetes  New since whipple procedure.   - sensitive sliding scale  HTN  Normotensive - PRN hydral IV added for BP>180/ >100  History of pancreatic mass s/p pancreatectomy for intraductal papillary mucinous neoplasia (IPMN with high grade dysplasia). - creon  History of TIA 1995  On Clopidogrel 75 mg.   Anxiety On lexapro 15 mg qhs   GERD  Stopping protonix  Kalman Shan PGY-2 Internal Medicine 10/04/2016, 11:32 AM   I have seen and examined this patient and agree with plan and assessment in the above note with renal recommendations/intervention highlighted.  Pt is well known to me from outpatient follow up and has had a progressive increase in her Scr since her surgery.  Her diarrhea has worsened and may have a component of volume depletion/ischemic ATN but also concerning for chronic interstitial nephritis related to PPI.  Will hold protonix and continue with IVF's and  follow UOP and Scr.  She may require renal biopsy if not improving over the weekend.  She does have some microscopic hematuria so will also check serologies for an acute GN although has otherwise been feeling well.   Broadus John A Sakib Noguez,MD 10/04/2016 4:18 PM

## 2016-10-04 NOTE — Progress Notes (Signed)
Family Medicine Teaching Service Daily Progress Note Intern Pager: [phone]  Patient name: Kim Burns Medical record number: [id number] Date of birth: 09-Jul-1935 Age: 81 y.o. Gender: female  Primary Care Provider: [name], MD Consultants: Plan to call nephro in AM Code Status: Full code, confirmed on admission  Pt Overview and Major Events to Date:  Kim Burns is a 82 y.o. female presenting with acute renal failure noted on outpatient labs drawn yesterday . PMH is significant for pancreatic mass s/p resection 04/2016, diabetes, HTN, hx TIA 1995 on plavix, Anxiety, and GERD  Assessment and Plan: Kim Burns is a 81 y.o. female presenting with acute renal failure noted on outpatient labs drawn yesterday . PMH is significant for pancreatic mass s/p resection 04/2016, diabetes, HTN, hx TIA 1995 on plavix, Anxiety, and GERD  Acute renal failure on CKD IV- Cr 4.72 currently.  May be prerenal given history of 4-5 episodes of diarrhea per day. With history of intra-abdominal mass, intrarenal etiology/mass is a possibility, patient denies night sweats but does have 40 lb unintentional wt loss in last 5 months. Medication list reviewed, no iatrogenic causes identified.  Post-renal etiology seems less likely with normal urination. On admission, cr 5.31 from baseline 1.8-1.9. BUN elevated at 77. No alterations in mentation, though symptomatic with fatigue and generalized weakness. Estimated Creatinine Clearance: 7.4 mL/min. Patient continues to urinate normally. Patient reports following with Dr. Marval Regal with [facility]. - admit FPTS attending Dr. Erin Hearing - monitor on telemetry - recheck MN BMP for BUN, Cr, and K - check urine electrolytes, calculate FeNa - check EKG - check mag, phos, AM renal function panel - monitor strict intake/output - renal ultrasound - consult to nephrology in AM -  IVF resuscitation with NS @75  cc/hr - avoid nephrotoxic agents - up with  assistance - vitals per unit  Hypokalemia - Current 3.2.  K 3.1 on admission. Likely low in setting of chronic diarrhea. Careful repletion in setting of acute renal failure.  Dropped to 2.7 overnight. - 20 Kdur ordered this morning, (60 total prior) - recheck MN BMP and AM renal function panel - continue careful repletion as needed  Hyperphsophatemia- 5.5 -consider diet restriction or phosphate binder, wait for nephro consult  Chronic Diarrhea, stable: may be 2/2 pancreatic mass resection, has been chronic since her surgery in 04/2016. 4-5 episodes daily, primarily at nighttime.Takes 2 creon tablets prior to meals, one creon tablet prior to snacks. - continue home creon  Diabetes - may be insulin deficiency 2/2 pancreatic procedure, as patient indicates she has had diabetes since her pancreatic mass resection in 04/2016. Was previously on glimepiride which was stopped by PCP. Takes novolog sliding scale with meals only. - CBGs QID with meals and bedtime - sensitive sliding scale  HTN - most recently hypotensive at clinic visits and so reports 25 mg TID hydralazine was discontinued. Meant to have follow up tomorrow which will be canceled due to hospitalization.  - vitals stable in ED, can initiate hydral TID as needed for hypertension - PRN hydral IV added for BP>180/ >100  Complaints of loss of balance/lightheadedness-could be vestibular schwannoma, hypotensive related, potentially autonomic but we have no indication of that currently, patient denies veritgo symptoms -orthostatic check ordered -consider vestibular -mri from 8/10...vestibular schwannoma  History of pancreatic mass, s/p pancreatectomy for intraductal papillary mucinous neoplasia (IPMN with high grade dysplasia). Patient reports she continues to follow with ?Surgery for this problem, states she was also meant to have an initial  visit with Dr. Paulita Fujita with GI tomorrow 8/17, however called to cancel this appt due to being in  the hospital. - continue creon as noted above -ask about endoscopy/colonoscopy -FOBT?  History of TIA 1995 - takes daily Clopidogrel 75 mg. Continued on admission.  Anxiety, stable - normal affect in ED. Continue home medication lexapro 15 mg qhs.  GERD, stable - protonix 40 BID noted in med rec; high dose. Re-ordered as 20 mg daily.   FEN/GI: Renal diet, cautious repletion of potassium, frequent BMPs Prophylaxis: Heparin given reduced CrCl  Disposition: Admit to FPTS telemetry  Subjective:  Kim Burns she feels slightly better this morning, has had general fatigue for most of the calendar year that has not resolved with her suregry in march.   She has consistent soft stools w/ diarrhe but has only has soft since admission.  She complains of chronic loss of balance that she denies is vertigo but seems consistent with orthostatic hypotension given her description.  Objective: Temp:  [97.9 F (36.6 C)-98.4 F (36.9 C)] 98.4 F (36.9 C) (08/17 0613) Pulse Rate:  [65-74] 69 (08/17 0613) Resp:  [15-19] 15 (08/17 0613) BP: (122-179)/(66-116) 122/66 (08/17 0613) SpO2:  [99 %-100 %] 99 % (08/17 0613) Weight:  [125 lb (56.7 kg)-129 lb (58.5 kg)] 125 lb (56.7 kg) (08/16 2300) Physical Exam: General: rests comfortably in bed, pleasant, alert Eyes: EOMI, no conjunctival pallor or injection, no scleral icterus Neck: no LAD, supple Cardiovascular: RRR, no m/r/g Respiratory: CTA bil, no W/R/R Gastrointestinal: soft, mild tenderness to deep palpation 3-4" below xyphoid without rebound or guarding, no hepatosplenomegaly appreciated MSK: moves 4 extremities equally Derm: no rashes or lesions Neuro: CN II-XII grossly intact Psych: AAOx3, appropriate affect, pleasant  Laboratory:  Recent Labs Lab 10/03/16 1314 10/04/16 0304  WBC 6.9 7.4  HGB 9.4* 8.8*  HCT 28.6* 26.6*  PLT 307 262    Recent Labs Lab 10/03/16 1314 10/03/16 2152 10/04/16 0304  NA 139 140 140  K 3.1* 2.7* 3.2*  CL  111 113* 114*  CO2 17* 18* 17*  BUN 77* 74* 71*  CREATININE 5.31* 4.89* 4.72*  CALCIUM 8.7* 8.7* 8.5*  GLUCOSE 166* 202* 219*      Imaging/Diagnostic Tests: Dg Chest 2 View  Result Date: 10/03/2016 CLINICAL DATA:  Fatigue. EXAM: CHEST  2 VIEW COMPARISON:  May 22, 2016 FINDINGS: The heart size and mediastinal contours are within normal limits. Both lungs are clear. The visualized skeletal structures are unremarkable. IMPRESSION: No active cardiopulmonary disease. Electronically Signed   By: Dorise Bullion III M.D   On: 10/03/2016 17:36   US Renal  Result Date: 10/04/2016 CLINICAL DATA:  Acute onset of renal failure.  Initial encounter. EXAM: RENAL / URINARY TRACT ULTRASOUND COMPLETE COMPARISON:  Renal ultrasound performed 09/05/2014, and MRI of the abdomen performed 11/20/2015 FINDINGS: Right Kidney: Length: 11.3 cm. Diffusely increased parenchymal echogenicity is noted. Scattered cysts are noted about the right kidney. No hydronephrosis visualized. Left Kidney: Length: 12.3 cm. Diffusely increased parenchymal echogenicity is noted. Scattered cysts are noted about the left kidney. No hydronephrosis visualized. Bladder: Appears normal for degree of bladder distention. IMPRESSION: 1. No evidence of hydronephrosis. 2. Diffusely increased parenchymal echogenicity raises concern for medical renal disease. 3. Scattered bilateral renal cysts again noted. Electronically Signed   By: Garald Balding M.D.   On: 10/04/2016 00:11     Sherene Sires, DO 10/04/2016, 7:03 AM PGY-1, [place] Intern pager: [phone], text pages welcome.

## 2016-10-04 NOTE — Progress Notes (Signed)
Initial Nutrition Assessment  DOCUMENTATION CODES:   Non-severe (moderate) malnutrition in context of chronic illness  INTERVENTION:   -Premier Protein BID  -Smaller, more frequent meals  -Recommend addition of Creon order for snacks  -Reviewed key concepts of diet post-pancreatectomy. Follow-up and provide further education   NUTRITION DIAGNOSIS:   Malnutrition (Moderate) related to chronic illness (pancreatitc mass s/p resection in March 2018 on creon with diarrhea, CKD) as evidenced by percent weight loss, mild depletion of body fat, mild depletion of muscle mass.  GOAL:   Patient will meet greater than or equal to 90% of their needs  MONITOR:   PO intake, Supplement acceptance, Labs, Weight trends  REASON FOR ASSESSMENT:   Malnutrition Screening Tool    ASSESSMENT:   81 yo female admitted with ARF on CKD IV.  Pt with hx of pancreatic mass s/p resection in March 2018, DM, HTN, TIA, anxiety, GERD  Pt reports appetite is good, eats 3 meals per day with snacks in between. Pt also supplements diet with Boost and Premier Protein shakes.   Pt indicates she has been experiencing diarrhea since have pancreatic surgery in March; pt takes Creon with all meals and snacks and reports her MD has been adjusting dose up and down. Pt reports stool is not as watery now but still loose and it "floats."  Per weight encounters, 16.9% wt loss since February of this year (significant for time frame).  Nutrition-Focused physical exam completed. Findings are mild fat depletion, mild muscle depletion, and no edema.   Labs: potassium 3.2, Creatinine 4.72, phosphorus 5.5 Meds: NS at 75 ml/hr, creon, KCL  Diet Order:  Diet renal with fluid restriction Fluid restriction: 1200 mL Fluid; Room service appropriate? Yes; Fluid consistency: Thin  Skin:  Reviewed, no issues  Last BM:  no documented BM  Height:   Ht Readings from Last 1 Encounters:  10/03/16 5\' 4"  (1.626 m)    Weight:    Wt Readings from Last 1 Encounters:  10/03/16 125 lb (56.7 kg)    Ideal Body Weight:     BMI:  Body mass index is 21.46 kg/m.  Estimated Nutritional Needs:   Kcal:  6579-0383 kcals  Protein:  85-95 g  Fluid:  >/= 1.7 L  EDUCATION NEEDS:   No education needs identified at this time  [place], Newborn, LDN [phone] Pager  [phone] Weekend/On-Call Pager

## 2016-10-05 DIAGNOSIS — N189 Chronic kidney disease, unspecified: Secondary | ICD-10-CM

## 2016-10-05 DIAGNOSIS — N179 Acute kidney failure, unspecified: Secondary | ICD-10-CM

## 2016-10-05 LAB — GLUCOSE, CAPILLARY
GLUCOSE-CAPILLARY: 106 mg/dL — AB (ref 65–99)
GLUCOSE-CAPILLARY: 104 mg/dL — AB (ref 65–99)
GLUCOSE-CAPILLARY: 158 mg/dL — AB (ref 65–99)
Glucose-Capillary: 88 mg/dL (ref 65–99)

## 2016-10-05 LAB — IRON AND TIBC
IRON: 49 ug/dL (ref 28–170)
Saturation Ratios: 13 % (ref 10.4–31.8)
TIBC: 364 ug/dL (ref 250–450)
UIBC: 315 ug/dL

## 2016-10-05 LAB — BASIC METABOLIC PANEL
ANION GAP: 9 (ref 5–15)
BUN: 63 mg/dL — ABNORMAL HIGH (ref 6–20)
CALCIUM: 9.2 mg/dL (ref 8.9–10.3)
CHLORIDE: 114 mmol/L — AB (ref 101–111)
CO2: 17 mmol/L — AB (ref 22–32)
Creatinine, Ser: 4.56 mg/dL — ABNORMAL HIGH (ref 0.44–1.00)
GFR calc non Af Amer: 8 mL/min — ABNORMAL LOW (ref >60)
GFR, EST AFRICAN AMERICAN: 10 mL/min — AB (ref >60)
Glucose, Bld: 137 mg/dL — ABNORMAL HIGH (ref 65–99)
Potassium: 3.5 mmol/L (ref 3.5–5.1)
SODIUM: 140 mmol/L (ref 135–145)

## 2016-10-05 LAB — C4 COMPLEMENT: Complement C4, Body Fluid: 24 mg/dL (ref 14–44)

## 2016-10-05 LAB — VITAMIN B12: Vitamin B-12: 250 pg/mL (ref 180–914)

## 2016-10-05 LAB — ANTI-DNA ANTIBODY, DOUBLE-STRANDED

## 2016-10-05 LAB — FERRITIN: Ferritin: 67 ng/mL (ref 11–307)

## 2016-10-05 LAB — ANTISTREPTOLYSIN O TITER: ASO: 86 IU/mL (ref 0.0–200.0)

## 2016-10-05 LAB — C3 COMPLEMENT: C3 COMPLEMENT: 112 mg/dL (ref 82–167)

## 2016-10-05 LAB — MAGNESIUM: Magnesium: 1.9 mg/dL (ref 1.7–2.4)

## 2016-10-05 LAB — COMPLEMENT, TOTAL: Compl, Total (CH50): >60 U/mL (ref >41)

## 2016-10-05 MED ORDER — POTASSIUM CHLORIDE CRYS ER 20 MEQ PO TBCR
20.0000 meq | EXTENDED_RELEASE_TABLET | Freq: Two times a day (BID) | ORAL | Status: DC
Start: 2016-10-05 — End: 2016-10-07
  Administered 2016-10-05 – 2016-10-07 (×5): 20 meq via ORAL
  Filled 2016-10-05 (×5): qty 1

## 2016-10-05 MED ORDER — DARBEPOETIN ALFA 60 MCG/0.3ML IJ SOSY
60.0000 ug | PREFILLED_SYRINGE | INTRAMUSCULAR | Status: DC
  Administered 2016-10-05: 60 ug via SUBCUTANEOUS
  Filled 2016-10-05: qty 0.3

## 2016-10-05 MED ORDER — STERILE WATER FOR INJECTION IV SOLN
150.0000 meq | INTRAVENOUS | Status: DC
  Administered 2016-10-05 – 2016-10-07 (×4): 150 meq via INTRAVENOUS
  Filled 2016-10-05 (×6): qty 850

## 2016-10-05 NOTE — Progress Notes (Signed)
Patient ID: Kim Burns, female   DOB: Jan 09, 1936, 81 y.o.   MRN: [id number] S: Feels better today O:BP 140/73 (BP Location: Right Arm)   Pulse 69   Temp 97.7 F (36.5 C) (Oral)   Resp 18   Ht 5\' 4"  (1.626 m)   Wt 56.7 kg (125 lb)   SpO2 100%   BMI 21.46 kg/m   Intake/Output Summary (Last 24 hours) at 10/05/16 1236 Last data filed at 10/05/16 0600  Gross per 24 hour  Intake             2040 ml  Output             2100 ml  Net              -60 ml   Intake/Output: I/O last 3 completed shifts: In: 3907.5 [P.O.:600; I.V.:2307.5; IV Piggyback:1000] Out: 2850 [Urine:2850]  Intake/Output this shift:  No intake/output data recorded. Weight change: -1.814 kg (-4 lb) Gen: NAD CVS:no rub Resp:cta IRS:WNIOEV Ext:no edema   Recent Labs Lab 10/03/16 1314 10/03/16 2152 10/04/16 0304 10/04/16 1623 10/05/16 0223  NA 139 140 140 140 140  K 3.1* 2.7* 3.2* 3.4* 3.5  CL 111 113* 114* 114* 114*  CO2 17* 18* 17* 18* 17*  GLUCOSE 166* 202* 219* 93 137*  BUN 77* 74* 71* 62* 63*  CREATININE 5.31* 4.89* 4.72* 4.39* 4.56*  ALBUMIN  --   --  3.1*  --   --   CALCIUM 8.7* 8.7* 8.5* 9.1 9.2  PHOS  --  5.5* 5.5*  --   --    Liver Function Tests:  Recent Labs Lab 10/04/16 0304  ALBUMIN 3.1*   No results for input(s): LIPASE, AMYLASE in the last 168 hours. No results for input(s): AMMONIA in the last 168 hours. CBC:  Recent Labs Lab 10/03/16 1314 10/04/16 0304  WBC 6.9 7.4  HGB 9.4* 8.8*  HCT 28.6* 26.6*  MCV 87.7 88.1  PLT 307 262   Cardiac Enzymes: No results for input(s): CKTOTAL, CKMB, CKMBINDEX, TROPONINI in the last 168 hours. CBG:  Recent Labs Lab 10/04/16 1217 10/04/16 1649 10/04/16 1948 10/05/16 0829 10/05/16 1225  GLUCAP 222* 101* 125* 106* 104*    Iron Studies: No results for input(s): IRON, TIBC, TRANSFERRIN, FERRITIN in the last 72 hours. Studies/Results: Dg Chest 2 View  Result Date: 10/03/2016 CLINICAL DATA:  Fatigue. EXAM: CHEST  2 VIEW  COMPARISON:  May 22, 2016 FINDINGS: The heart size and mediastinal contours are within normal limits. Both lungs are clear. The visualized skeletal structures are unremarkable. IMPRESSION: No active cardiopulmonary disease. Electronically Signed   By: Dorise Bullion III M.D   On: 10/03/2016 17:36   US Renal  Result Date: 10/04/2016 CLINICAL DATA:  Acute onset of renal failure.  Initial encounter. EXAM: RENAL / URINARY TRACT ULTRASOUND COMPLETE COMPARISON:  Renal ultrasound performed 09/05/2014, and MRI of the abdomen performed 11/20/2015 FINDINGS: Right Kidney: Length: 11.3 cm. Diffusely increased parenchymal echogenicity is noted. Scattered cysts are noted about the right kidney. No hydronephrosis visualized. Left Kidney: Length: 12.3 cm. Diffusely increased parenchymal echogenicity is noted. Scattered cysts are noted about the left kidney. No hydronephrosis visualized. Bladder: Appears normal for degree of bladder distention. IMPRESSION: 1. No evidence of hydronephrosis. 2. Diffusely increased parenchymal echogenicity raises concern for medical renal disease. 3. Scattered bilateral renal cysts again noted. Electronically Signed   By: Garald Balding M.D.   On: 10/04/2016 00:11   . cholecalciferol  1,000 Units Oral  Daily  . clopidogrel  75 mg Oral q morning - 10a  . escitalopram  15 mg Oral QHS  . heparin  5,000 Units Subcutaneous Q8H  . insulin aspart  0-9 Units Subcutaneous TID WC  . lipase/protease/amylase  72,000 Units Oral TID WC  . protein supplement shake  11 oz Oral BID BM    BMET    Component Value Date/Time   NA 140 10/05/2016 0223   K 3.5 10/05/2016 0223   CL 114 (H) 10/05/2016 0223   CO2 17 (L) 10/05/2016 0223   GLUCOSE 137 (H) 10/05/2016 0223   BUN 63 (H) 10/05/2016 0223   CREATININE 4.56 (H) 10/05/2016 0223   CALCIUM 9.2 10/05/2016 0223   GFRNONAA 8 (L) 10/05/2016 0223   GFRAA 10 (L) 10/05/2016 0223   CBC    Component Value Date/Time   WBC 7.4 10/04/2016 0304   RBC  3.02 (L) 10/04/2016 0304   HGB 8.8 (L) 10/04/2016 0304   HCT 26.6 (L) 10/04/2016 0304   PLT 262 10/04/2016 0304   MCV 88.1 10/04/2016 0304   MCH 29.1 10/04/2016 0304   MCHC 33.1 10/04/2016 0304   RDW 15.4 10/04/2016 0304   LYMPHSABS 2.6 04/16/2016 1444   MONOABS 0.4 04/16/2016 1444   EOSABS 0.1 04/16/2016 1444   BASOSABS 0.0 04/16/2016 1444     Assessment/Plan:  1. AKI/CKD stage 3-4- baseline Scr has ranged from 1.8-2.7 but jumped up to 5.31 on 10/03/16.  Possibly due to chronic diarrhea since Whipple procedure but also concerning for chronic interstitial nephritis related to protonix. Scr improved with IVF's and no diarrhea over the last 24 hours.  Scr has reached a plateau.  Continue with IVF's and follow UOP and Scr.  protonix on hold and not to be restarted.  2. Anemia of chronic disease- check iron stores and follow H/H. Will also start ESA and continue as an outpatient.  3. Hypokalemia and hypomagnesemia- repleted and will follow 4. Chronic diarrhea- improved with Creon 5. DM- stable 6. H/o TIA on plavix. 7. HTN- stable  Donetta Potts, MD [employer] [phone]

## 2016-10-05 NOTE — Progress Notes (Signed)
Family Medicine Teaching Service Daily Progress Note Intern Pager: [phone]  Patient name: AJEENAH HEINY Medical record number: [id number] Date of birth: August 06, 1935 Age: 81 y.o. Gender: female  Primary Care Provider: [name], MD Consultants: Plan to call nephro in AM Code Status: Full code, confirmed on admission  Pt Overview and Major Events to Date:  VAN EHLERT is a 81 y.o. female presenting with acute renal failure noted on outpatient labs drawn yesterday . PMH is significant for pancreatic mass s/p resection 04/2016, diabetes, HTN, hx TIA 1995 on plavix, Anxiety, and GERD  Assessment and Plan: DORRIE COCUZZA is a 81 y.o. female presenting with acute renal failure noted on outpatient labs drawn yesterday . PMH is significant for pancreatic mass s/p resection 04/2016, diabetes, HTN, hx TIA 1995 on plavix, Anxiety, and GERD  Acute renal failure on CKD IV- Likely combination of pre-renal due to chronic diarrhea (also with some recent low BPs as an outpatient) + intrinsic renal cause (possibly protonix-induced interstitial nephritis) due to FENa 7.5%. Renal US without hydronephrosis. ASO and complement levels normal, making acute GN less likely. Cr 5.31 on admission, improved to 4.39 yesterday then bumped slightly to 4.56 today. - nephro following, appreciate recommendations - holding Protonix due to concern for chronic interstitial nephritis. Will also need to hold at discharge. - continue IVFs at 75cc/hr - avoid nephrotoxic agents  Hyperphosphatemia- stable at 5.5 -will check again tomorrow  Chronic Diarrhea, resolved. No diarrhea for the last 2 days. May be 2/2 pancreatic mass resection, has been chronic since her surgery in 04/2016.  - continue home creon  Diabetes - may be insulin deficiency 2/2 pancreatic procedure, as patient indicates she has had diabetes since her pancreatic mass resection in 04/2016. Was previously on glimepiride which was stopped by PCP.  Takes novolog sliding scale with meals only. CBGs have ranged 71-222 in the last 24 hours. - CBGs QID with meals and bedtime - sensitive sliding scale  HTN - previously on Hydralazine 25mg  tid at home, but was discontinued as an outpatient due to low BPs at clinic visits. BPs have been in the 130s/70s. 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 Patient reports she continues to follow with ?Surgery for this problem, states she was also meant to have an initial visit with Dr. Paulita Fujita with GI tomorrow 8/17, however called to cancel this appt due to being in the hospital. -continue creon as noted above -follow-up with GI as an outpatient -FOBT?  History of TIA 1995: -continue Plavix  Anxiety, stable. - Continue home medication lexapro 15 mg qhs.  GERD, stable -holding protonix and will not restart on discharge  FEN/GI: Renal diet Prophylaxis: Heparin  Disposition: discharge pending improvement in creatinine  Subjective:  Doing well this morning. States she had an episode of dizziness when going from sitting to standing. Felt like she was going to pass out, but didn't. She has not had any diarrhea for the past two days. She did have one normal BM today.  Objective: Temp:  [97.7 F (36.5 C)-98.7 F (37.1 C)]  97.7 F (36.5 C) (08/18 0514) Pulse Rate:  [62-79] 62 (08/18 0514) Resp:  [18] 18 (08/18 0514) BP: (132-144)/(62-78) 136/78 (08/18 0514) SpO2:  [98 %-100 %] 100 % (08/18 0514) Weight:   [125 lb (56.7 kg)] 125 lb (56.7 kg) (08/17 2053) Physical Exam: General: sitting up in chair, pleasant, conversational Eyes: EOMI, no conjunctival pallor or injection, no scleral icterus Neck: no LAD, supple Cardiovascular: RRR, no m/r/g Respiratory: CTA bil, no W/R/R Gastrointestinal: soft, non-tender, non-distended Psych: AAOx3, appropriate affect, pleasant  Laboratory:  Recent Labs Lab 10/03/16 1314 10/04/16 0304  WBC 6.9 7.4  HGB 9.4* 8.8*  HCT 28.6* 26.6*  PLT 307 262    Recent Labs Lab 10/04/16 0304 10/04/16 1623 10/05/16 0223  NA 140 140 140  K 3.2* 3.4* 3.5  CL 114* 114* 114*  CO2 17* 18* 17*  BUN 71* 62* 63*  CREATININE 4.72* 4.39* 4.56*  CALCIUM 8.5* 9.1 9.2  GLUCOSE 219* 93 137*      Imaging/Diagnostic Tests: Dg Chest 2 View  Result Date: 10/03/2016 CLINICAL DATA:  Fatigue. EXAM: CHEST  2 VIEW COMPARISON:  May 22, 2016 FINDINGS: The heart size and mediastinal contours are within normal limits. Both lungs are clear. The visualized skeletal structures are unremarkable. IMPRESSION: No active cardiopulmonary disease. Electronically Signed   By: Dorise Bullion III M.D   On: 10/03/2016 17:36   US Renal  Result Date: 10/04/2016 CLINICAL DATA:  Acute onset of renal failure.  Initial encounter. EXAM: RENAL / URINARY TRACT ULTRASOUND COMPLETE COMPARISON:  Renal ultrasound performed 09/05/2014, and MRI of the abdomen performed 11/20/2015 FINDINGS: Right Kidney: Length: 11.3 cm. Diffusely increased parenchymal echogenicity is noted. Scattered cysts are noted about the right kidney. No hydronephrosis visualized. Left Kidney: Length: 12.3 cm. Diffusely increased parenchymal echogenicity is noted. Scattered cysts are noted about the left kidney. No hydronephrosis visualized. Bladder: Appears normal for degree of bladder distention. IMPRESSION: 1. No evidence of hydronephrosis. 2. Diffusely increased parenchymal echogenicity raises concern for medical renal disease. 3.  Scattered bilateral renal cysts again noted. Electronically Signed   By: Garald Balding M.D.   On: 10/04/2016 00:11     Livier Hendel, Pete Pelt, MD 10/05/2016, 8:54 AM PGY-2, [facility] Intern pager: [phone], text pages welcome.

## 2016-10-06 LAB — CBC WITH DIFFERENTIAL/PLATELET
BASOS PCT: 0 %
Basophils Absolute: 0 10*3/uL (ref 0.0–0.1)
EOS ABS: 0.1 10*3/uL (ref 0.0–0.7)
EOS PCT: 1 %
HCT: 26.6 % — ABNORMAL LOW (ref 36.0–46.0)
Hemoglobin: 8.7 g/dL — ABNORMAL LOW (ref 12.0–15.0)
Lymphocytes Relative: 31 %
Lymphs Abs: 2 10*3/uL (ref 0.7–4.0)
MCH: 28.3 pg (ref 26.0–34.0)
MCHC: 32.7 g/dL (ref 30.0–36.0)
MCV: 86.6 fL (ref 78.0–100.0)
MONOS PCT: 7 %
Monocytes Absolute: 0.4 10*3/uL (ref 0.1–1.0)
Neutro Abs: 3.9 10*3/uL (ref 1.7–7.7)
Neutrophils Relative %: 61 %
PLATELETS: 259 10*3/uL (ref 150–400)
RBC: 3.07 MIL/uL — ABNORMAL LOW (ref 3.87–5.11)
RDW: 15.3 % (ref 11.5–15.5)
WBC: 6.4 10*3/uL (ref 4.0–10.5)

## 2016-10-06 LAB — GLUCOSE, CAPILLARY
GLUCOSE-CAPILLARY: 101 mg/dL — AB (ref 65–99)
GLUCOSE-CAPILLARY: 113 mg/dL — AB (ref 65–99)
Glucose-Capillary: 183 mg/dL — ABNORMAL HIGH (ref 65–99)
Glucose-Capillary: 111 mg/dL — ABNORMAL HIGH (ref 65–99)

## 2016-10-06 LAB — RENAL FUNCTION PANEL
Albumin: 2.8 g/dL — ABNORMAL LOW (ref 3.5–5.0)
Anion gap: 9 (ref 5–15)
BUN: 68 mg/dL — ABNORMAL HIGH (ref 6–20)
CALCIUM: 9.5 mg/dL (ref 8.9–10.3)
CO2: 22 mmol/L (ref 22–32)
CREATININE: 4.3 mg/dL — AB (ref 0.44–1.00)
Chloride: 110 mmol/L (ref 101–111)
GFR calc Af Amer: 10 mL/min — ABNORMAL LOW (ref >60)
GFR calc non Af Amer: 9 mL/min — ABNORMAL LOW (ref >60)
GLUCOSE: 133 mg/dL — AB (ref 65–99)
Phosphorus: 4.7 mg/dL — ABNORMAL HIGH (ref 2.5–4.6)
Potassium: 3.4 mmol/L — ABNORMAL LOW (ref 3.5–5.1)
SODIUM: 141 mmol/L (ref 135–145)

## 2016-10-06 MED ORDER — FERUMOXYTOL INJECTION 510 MG/17 ML
510.0000 mg | INTRAVENOUS | Status: DC
  Administered 2016-10-06: 510 mg via INTRAVENOUS
  Filled 2016-10-06: qty 17

## 2016-10-06 NOTE — Progress Notes (Signed)
Patient ID: Kim Burns, female   DOB: [birth year], 81 y.o.   MRN: [id number] S: Feels better O:BP (!) 139/98 (BP Location: Right Arm)   Pulse 66   Temp 98.6 F (37 C) (Oral)   Resp 18   Ht 5\' 4"  (1.626 m)   Wt 56.6 kg (124 lb 12.5 oz)   SpO2 100%   BMI 21.42 kg/m   Intake/Output Summary (Last 24 hours) at 10/06/16 1133 Last data filed at 10/06/16 0900  Gross per 24 hour  Intake          2172.75 ml  Output             2700 ml  Net          -527.25 ml   Intake/Output: I/O last 3 completed shifts: In: 3972.8 [P.O.:924; I.V.:3048.8] Out: 4300 [Urine:4300]  Intake/Output this shift:  Total I/O In: 240 [P.O.:240] Out: 0  Weight change: -0.1 kg (-3.5 oz) Gen:NAD CVS: no rub Resp:cta LPF:XTKWIO Ext:no edema   Recent Labs Lab 10/03/16 1314 10/03/16 2152 10/04/16 0304 10/04/16 1623 10/05/16 0223 10/06/16 0402  NA 139 140 140 140 140 141  K 3.1* 2.7* 3.2* 3.4* 3.5 3.4*  CL 111 113* 114* 114* 114* 110  CO2 17* 18* 17* 18* 17* 22  GLUCOSE 166* 202* 219* 93 137* 133*  BUN 77* 74* 71* 62* 63* 68*  CREATININE 5.31* 4.89* 4.72* 4.39* 4.56* 4.30*  ALBUMIN  --   --  3.1*  --   --  2.8*  CALCIUM 8.7* 8.7* 8.5* 9.1 9.2 9.5  PHOS  --  5.5* 5.5*  --   --  4.7*   Liver Function Tests:  Recent Labs Lab 10/04/16 0304 10/06/16 0402  ALBUMIN 3.1* 2.8*   No results for input(s): LIPASE, AMYLASE in the last 168 hours. No results for input(s): AMMONIA in the last 168 hours. CBC:  Recent Labs Lab 10/03/16 1314 10/04/16 0304 10/06/16 0402  WBC 6.9 7.4 6.4  NEUTROABS  --   --  3.9  HGB 9.4* 8.8* 8.7*  HCT 28.6* 26.6* 26.6*  MCV 87.7 88.1 86.6  PLT 307 262 259   Cardiac Enzymes: No results for input(s): CKTOTAL, CKMB, CKMBINDEX, TROPONINI in the last 168 hours. CBG:  Recent Labs Lab 10/05/16 0829 10/05/16 1225 10/05/16 1733 10/05/16 2014 10/06/16 0814  GLUCAP 106* 104* 158* 88 101*    Iron Studies:  Recent Labs  10/05/16 1310  IRON 49  TIBC 364   FERRITIN 67   Studies/Results: No results found. . cholecalciferol  1,000 Units Oral Daily  . clopidogrel  75 mg Oral q morning - 10a  . darbepoetin (ARANESP) injection - NON-DIALYSIS  60 mcg Subcutaneous Q Sat-1800  . escitalopram  15 mg Oral QHS  . heparin  5,000 Units Subcutaneous Q8H  . insulin aspart  0-9 Units Subcutaneous TID WC  . lipase/protease/amylase  72,000 Units Oral TID WC  . potassium chloride  20 mEq Oral BID  . protein supplement shake  11 oz Oral BID BM    BMET    Component Value Date/Time   NA 141 10/06/2016 0402   K 3.4 (L) 10/06/2016 0402   CL 110 10/06/2016 0402   CO2 22 10/06/2016 0402   GLUCOSE 133 (H) 10/06/2016 0402   BUN 68 (H) 10/06/2016 0402   CREATININE 4.30 (H) 10/06/2016 0402   CALCIUM 9.5 10/06/2016 0402   GFRNONAA 9 (L) 10/06/2016 0402   GFRAA 10 (L) 10/06/2016 0402   CBC  Component Value Date/Time   WBC 6.4 10/06/2016 0402   RBC 3.07 (L) 10/06/2016 0402   HGB 8.7 (L) 10/06/2016 0402   HCT 26.6 (L) 10/06/2016 0402   PLT 259 10/06/2016 0402   MCV 86.6 10/06/2016 0402   MCH 28.3 10/06/2016 0402   MCHC 32.7 10/06/2016 0402   RDW 15.3 10/06/2016 0402   LYMPHSABS 2.0 10/06/2016 0402   MONOABS 0.4 10/06/2016 0402   EOSABS 0.1 10/06/2016 0402   BASOSABS 0.0 10/06/2016 0402     Assessment/Plan:  1. AKI/CKD stage 3-4- baseline Scr has ranged from 1.8-2.7 but jumped up to 5.31 on 10/03/16.  Possibly due to chronic diarrhea since Whipple procedure but also concerning for chronic interstitial nephritis related to protonix.  1. Scr improved with IVF's and no diarrhea over the last 48 hours.   2. Continue with IVF's and follow UOP and Scr.   3. protonix on hold and not to be restarted. 4. If Scr does not return to baseline will f/u as an outpatient and set up for renal biopsy (she is on plavix and will need to be off for 5 days prior to bx)  2. Anemia of chronic disease- check iron stores and follow H/H. Will also start ESA and continue  as an outpatient.  3. Hypokalemia and hypomagnesemia- repleted and will follow 4. Chronic diarrhea- improved with Creon 5. DM- stable 6. H/o TIA on plavix. 7. HTN- stable  Donetta Potts, MD [employer] [phone]

## 2016-10-06 NOTE — Discharge Summary (Signed)
[facility] Discharge Summary  Patient name: Kim Burns Medical record number: [id number] Date of birth: 30-Jun-1935 Age: 81 y.o. Gender: female Date of Admission: 10/03/2016  Date of Discharge: 10/07/16 Admitting Physician: Lind Covert, MD  Primary Care Provider: [name], MD Consultants: nephro  Indication for Hospitalization: AKI  Discharge Diagnoses/Problem List:  CKD IV Hx of whipple for pancreatic cancer DMT2 Anxiety GERD  Disposition: home  Discharge Condition: stable  Discharge Exam: General:able to sit up without assistance for exam, pleasant, conversational Eyes: EOMI, no conjunctival pallor or injection, no scleral icterus Neck: no LAD, supple Cardiovascular: RRR, no m/r/g Respiratory: CTA bil, no W/R/R Gastrointestinal: soft, non-tender, non-distended Psych: AAOx3, appropriate affect, pleasant  Brief Hospital Course:  Kim Colasurdo Turneris a 81 y.o.femalepresenting with acute renal failure noted on outpatient labs drawn yesterday. PMH is significant for pancreatic mass s/p resection 04/2016, diabetes, HTN, hx TIA 1995 on plavix, Anxiety, and GERD. Possible diagnosis of chronic interstitial nephritis due to protonix so protonix should be discontinued going forward.  Creatinine at admission of 5.31 decreased to <4 with 3 days of gentle hydration and intitial plans to pursue vascular access for HD have been postponed due to her improving prognosis.   She can follow with nephrology outpatient  Issues for Follow Up:  1. No protonix going forward for concern of interstitial nehpritis 2. Nephrology was considering biopsy to confirm suspected interstitial nephritis, please confirm and assist with timing of plavix cessation 3. Nephrology was considering timing of vascular access for HD.   Please keep patient in contact with nephrology to allow proper timing of vascular access  Significant Procedures:   Significant Labs  and Imaging:   Recent Labs Lab 10/03/16 1314 10/04/16 0304 10/05/16 1310 10/06/16 0402  WBC 6.9 7.4  --  6.4  HGB 9.4* 8.8*  --  8.7*  HCT 28.6* 26.6* 28.0* 26.6*  PLT 307 262  --  259    Recent Labs Lab 10/03/16 2152 10/04/16 0304 10/04/16 1623 10/05/16 0223 10/06/16 0402 10/07/16 0452  NA 140 140 140 140 141 142  K 2.7* 3.2* 3.4* 3.5 3.4* 3.3*  CL 113* 114* 114* 114* 110 104  CO2 18* 17* 18* 17* 22 31  GLUCOSE 202* 219* 93 137* 133* 103*  BUN 74* 71* 62* 63* 68* 63*  CREATININE 4.89* 4.72* 4.39* 4.56* 4.30* 3.75*  CALCIUM 8.7* 8.5* 9.1 9.2 9.5 9.7  MG 1.6*  --   --  1.9  --   --   PHOS 5.5* 5.5*  --   --  4.7* 4.0  ALBUMIN  --  3.1*  --   --  2.8* 3.0*    Dg Chest 2 View  Result Date: 10/03/2016 CLINICAL DATA:  Fatigue. EXAM: CHEST  2 VIEW COMPARISON:  May 22, 2016 FINDINGS: The heart size and mediastinal contours are within normal limits. Both lungs are clear. The visualized skeletal structures are unremarkable. IMPRESSION: No active cardiopulmonary disease. Electronically Signed   By: Dorise Bullion III M.D   On: 10/03/2016 17:36   US Renal  Result Date: 10/04/2016 CLINICAL DATA:  Acute onset of renal failure.  Initial encounter. EXAM: RENAL / URINARY TRACT ULTRASOUND COMPLETE COMPARISON:  Renal ultrasound performed 09/05/2014, and MRI of the abdomen performed 11/20/2015 FINDINGS: Right Kidney: Length: 11.3 cm. Diffusely increased parenchymal echogenicity is noted. Scattered cysts are noted about the right kidney. No hydronephrosis visualized. Left Kidney: Length: 12.3 cm. Diffusely increased parenchymal echogenicity is noted. Scattered cysts are  noted about the left kidney. No hydronephrosis visualized. Bladder: Appears normal for degree of bladder distention. IMPRESSION: 1. No evidence of hydronephrosis. 2. Diffusely increased parenchymal echogenicity raises concern for medical renal disease. 3. Scattered bilateral renal cysts again noted. Electronically Signed   By:  Garald Balding M.D.   On: 10/04/2016 00:11    Results/Tests Pending at Time of Discharge: N/A  Discharge Medications:  Allergies as of 10/07/2016      Reactions   Codeine Other (See Comments)   headache   Doxycycline Other (See Comments)   Pt didn't really remember about reaction.   Gemfibrozil Other (See Comments)   Myalgia    Hydrocodone-acetaminophen Other (See Comments)   Headache   Lotemax [loteprednol Etabonate]    burning   Statins Other (See Comments)   Muscle weakness      Medication List    STOP taking these medications   glimepiride 2 MG tablet Commonly known as:  AMARYL   hydrALAZINE 25 MG tablet Commonly known as:  APRESOLINE   pantoprazole 40 MG tablet Commonly known as:  PROTONIX     TAKE these medications   acetaminophen 500 MG tablet Commonly known as:  TYLENOL Take 1,000 mg by mouth every 6 (six) hours as needed for moderate pain.   azelastine 0.1 % nasal spray Commonly known as:  ASTELIN Place 1 spray into both nostrils at bedtime as needed for rhinitis. Use in each nostril as directed   bisacodyl 10 MG suppository Commonly known as:  DULCOLAX Place 1 suppository (10 mg total) rectally daily. What changed:  when to take this  reasons to take this   calcitRIOL 0.25 MCG capsule Commonly known as:  ROCALTROL Take 0.25 mcg by mouth every Monday, Wednesday, and Friday. At night   cinacalcet 30 MG tablet Commonly known as:  SENSIPAR Take 30 mg by mouth daily with supper.   clobetasol 0.05 % Gel Commonly known as:  TEMOVATE Apply 1 application topically daily as needed (for itching).   clopidogrel 75 MG tablet Commonly known as:  PLAVIX Take 75 mg by mouth every morning.   CREON PO Take 1-2 capsules by mouth 3 (three) times daily as needed (for loose stool). Take 2 caps before meals; take 1 caps before snack.   diclofenac sodium 1 % Gel Commonly known as:  VOLTAREN Apply 2 g topically 4 (four) times daily as needed (for hand  pain).   diphenhydramine-acetaminophen 25-500 MG Tabs tablet Commonly known as:  TYLENOL PM Take 2 tablets by mouth at bedtime as needed (for sleep).   escitalopram 5 MG tablet Commonly known as:  LEXAPRO Take 15 mg by mouth at bedtime.   feeding supplement (PRO-STAT SUGAR FREE 64) Liqd Take 30 mLs by mouth 3 (three) times daily. What changed:  when to take this  reasons to take this   fenofibrate 145 MG tablet Commonly known as:  TRICOR Take 145 mg by mouth every morning.   GENTEAL TEARS OP Apply 1 drop to eye 2 (two) times daily as needed (dry eyes).   gi cocktail Susp suspension Take 30 mLs by mouth 3 (three) times daily as needed for indigestion. Shake well. What changed:  when to take this  additional instructions   insulin aspart 100 UNIT/ML injection Commonly known as:  novoLOG Inject 0-9 Units into the skin 4 (four) times daily -  before meals and at bedtime. What changed:  how much to take  additional instructions   ondansetron 4 MG disintegrating tablet Commonly  known as:  ZOFRAN-ODT Take 1 tablet (4 mg total) by mouth every 6 (six) hours as needed for nausea.   polyethylene glycol packet Commonly known as:  MIRALAX / GLYCOLAX Take 17 g by mouth daily.   potassium chloride 10 MEQ tablet Commonly known as:  K-DUR,KLOR-CON Take 1 tablet (10 mEq total) by mouth daily.   sodium bicarbonate 650 MG tablet Take 2 tablets (1,300 mg total) by mouth 2 (two) times daily.   SYSTANE 0.4-0.3 % Soln Generic drug:  Polyethyl Glycol-Propyl Glycol Place 1 drop into both eyes 2 (two) times daily.   traMADol 50 MG tablet Commonly known as:  ULTRAM Take 2 tablets (100 mg total) by mouth every 12 (twelve) hours as needed for moderate pain.   ULORIC 80 MG Tabs Generic drug:  Febuxostat Take 80 mg by mouth every morning.   Vitamin D (Ergocalciferol) 50000 units Caps capsule Commonly known as:  DRISDOL Take 50,000 Units by mouth every Wednesday. Wednesdays    Vitamin D3 400 units Caps Take 800 Units by mouth daily.            Discharge Care Instructions        Start     Ordered   10/08/16 0000  potassium chloride (K-DUR,KLOR-CON) 10 MEQ tablet  Daily     10/07/16 1218   10/07/16 0000  sodium bicarbonate 650 MG tablet  2 times daily     10/07/16 1535   10/07/16 0000  Increase activity slowly     10/07/16 1535   10/07/16 0000  Diet - low sodium heart healthy     10/07/16 1535      Discharge Instructions: Please refer to Patient Instructions section of EMR for full details.  Patient was counseled important signs and symptoms that should prompt return to medical care, changes in medications, dietary instructions, activity restrictions, and follow up appointments.   Follow-Up Appointments: Follow-up Information    [name], MD. Schedule an appointment as soon as possible for a visit in 1 week(s).   Specialty:  Family Medicine Contact information: [address] [place] [address] [place], [place], DO 10/09/2016, 7:24 PM PGY-1, [facility]

## 2016-10-06 NOTE — Progress Notes (Signed)
Family Medicine Teaching Service Daily Progress Note Intern Pager: [phone]  Patient name: Kim Burns Medical record number: [id number] Date of birth: Aug 29, 1935 Age: 81 y.o. Gender: female  Primary Care Provider: [name], MD Consultants: nephro Code Status:Full code, confirmed on admission  Pt Overview and Major Events to Date:  Kim Ripp Turneris a 81 y.o.femalepresenting with acute renal failure noted on outpatient labs drawn yesterday. PMH is significant for pancreatic mass s/p resection 04/2016, diabetes, HTN, hx TIA 1995 on plavix, Anxiety, and GERD.   Possible diagnosis of chronic interstitial nephritis due to protonix so protonix should be discontinued going forward.  Assessment and Plan: Kim Un Turneris a 81 y.o.femalepresenting with acute renal failure noted on outpatient labs drawn yesterday. PMH is significant for pancreatic mass s/p resection 04/2016, diabetes, HTN, hx TIA 1995 on plavix, Anxiety, and GERD  Acute renal failure on CKD IV- Likely combination of pre-renal due to chronic diarrhea (also with some recent low BPs as an outpatient) + intrinsic renal cause (possibly protonix-induced interstitial nephritis) due to FENa 7.5%. Renal US without hydronephrosis. ASO and complement levels normal, making acute GN less likely. Cr 5.31 on admission, improved to 4.39 yesterday then bumped slightly to 4.56 today. - nephro following, appreciate recommendations - holding Protonix due to concern for chronic interstitial nephritis. Will also need to hold at discharge. - continue IVFs at 75cc/hr - avoid nephrotoxic agents  Hyperphosphatemia- stable at 5.5 -will check again 8/20  Chronic Diarrhea, resolved. No diarrhea for the last 2 days. May be 2/2 pancreatic mass resection, has been chronic since her surgery in 04/2016.  - continue home creon  Diabetes - may be insulin deficiency 2/2 pancreatic procedure, as patient indicates she has had diabetes since  her pancreatic mass resection in 04/2016. Was previously on glimepiride which was stopped by PCP. Takes novolog sliding scale with meals only. CBGs have ranged 71-222 in the last 24 hours. - CBGs QID with meals and bedtime - sensitive sliding scale  HTN- previously on Hydralazine 25mg  tid at home, but was discontinued as an outpatient due to low BPs at clinic visits. BPs have been in the 130s/70s. 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 Patient reports she continues to follow with ?Surgery for this problem, states she was also meant to have an initial visit with Dr. Paulita Fujita with GI tomorrow 8/17, however called to cancel this appt due to being in the hospital. -continue creon as noted above -follow-up with GI as an outpatient  History of TIA 1995: -continue Plavix  Anxiety, stable. - Continue home medication lexapro 15 mg qhs.  GERD, stable -holding protonix and will not restart on discharge  FEN/GI: Renal diet Prophylaxis: Heparin  Disposition:discharge pending improvement in creatinine  Subjective:  Patient felt well overnight and had no complaints, she thought she was comfortable to go home if we approved it and had 2 soft bowel movements overnight.  Objective: Temp:  [97.7 F (36.5 C)-98.7 F (37.1 C)] 98.5 F (36.9 C) (08/18 2127) Pulse Rate:  [62-69] 68 (08/18 2127) Resp:  [18-20] 20 (  08/18 2127) BP: (116-161)/(62-78) 116/62 (08/18 2127) SpO2:  [100 %] 100 % (08/18  2127) Weight:  [124 lb 12.5 oz (56.6 kg)] 124 lb 12.5 oz (56.6 kg) (08/18 2127) Physical Exam: General:able to sit up without assistance for exam, pleasant, conversational Eyes: EOMI, no conjunctival pallor or injection, no scleral icterus Neck: no LAD, supple Cardiovascular: RRR, no m/r/g Respiratory: CTA bil, no W/R/R Gastrointestinal: soft, non-tender, non-distended Psych: AAOx3, appropriate affect, pleasant  Laboratory:  Recent Labs Lab 10/03/16 1314 10/04/16 0304  WBC 6.9 7.4  HGB 9.4* 8.8*  HCT 28.6* 26.6*  PLT 307 262    Recent Labs Lab 10/04/16 0304 10/04/16 1623 10/05/16 0223  NA 140 140 140  K 3.2* 3.4* 3.5  CL 114* 114* 114*  CO2 17* 18* 17*  BUN 71* 62* 63*  CREATININE 4.72* 4.39* 4.56*  CALCIUM 8.5* 9.1 9.2  GLUCOSE 219* 93 137*      Imaging/Diagnostic Tests: Dg Chest 2 View  Result Date: 10/03/2016 CLINICAL DATA:  Fatigue. EXAM: CHEST  2 VIEW COMPARISON:  May 22, 2016 FINDINGS: The heart size and mediastinal contours are within normal limits. Both lungs are clear. The visualized skeletal structures are unremarkable. IMPRESSION: No active cardiopulmonary disease. Electronically Signed   By: Dorise Bullion III M.D   On: 10/03/2016 17:36   US Renal  Result Date: 10/04/2016 CLINICAL DATA:  Acute onset of renal failure.  Initial encounter. EXAM: RENAL / URINARY TRACT ULTRASOUND COMPLETE COMPARISON:  Renal ultrasound performed 09/05/2014, and MRI of the abdomen performed 11/20/2015 FINDINGS: Right Kidney: Length: 11.3 cm. Diffusely increased parenchymal echogenicity is noted. Scattered cysts are noted about the right kidney. No hydronephrosis visualized. Left Kidney: Length: 12.3 cm. Diffusely increased parenchymal echogenicity is noted. Scattered cysts are noted about the left kidney. No hydronephrosis visualized. Bladder: Appears normal for degree of bladder distention. IMPRESSION: 1. No evidence of hydronephrosis. 2. Diffusely increased parenchymal  echogenicity raises concern for medical renal disease. 3. Scattered bilateral renal cysts again noted. Electronically Signed   By: Garald Balding M.D.   On: 10/04/2016 00:11     Sherene Sires, DO 10/06/2016, 12:59 AM PGY-1, [facility] pager: [phone], text pages welcome

## 2016-10-07 LAB — RENAL FUNCTION PANEL
ALBUMIN: 3 g/dL — AB (ref 3.5–5.0)
ANION GAP: 7 (ref 5–15)
BUN: 63 mg/dL — ABNORMAL HIGH (ref 6–20)
CO2: 31 mmol/L (ref 22–32)
Calcium: 9.7 mg/dL (ref 8.9–10.3)
Chloride: 104 mmol/L (ref 101–111)
Creatinine, Ser: 3.75 mg/dL — ABNORMAL HIGH (ref 0.44–1.00)
GFR, EST AFRICAN AMERICAN: 12 mL/min — AB (ref >60)
GFR, EST NON AFRICAN AMERICAN: 11 mL/min — AB (ref >60)
Glucose, Bld: 103 mg/dL — ABNORMAL HIGH (ref 65–99)
PHOSPHORUS: 4 mg/dL (ref 2.5–4.6)
POTASSIUM: 3.3 mmol/L — AB (ref 3.5–5.1)
Sodium: 142 mmol/L (ref 135–145)

## 2016-10-07 LAB — GLUCOSE, CAPILLARY
GLUCOSE-CAPILLARY: 125 mg/dL — AB (ref 65–99)
GLUCOSE-CAPILLARY: 121 mg/dL — AB (ref 65–99)
Glucose-Capillary: 139 mg/dL — ABNORMAL HIGH (ref 65–99)

## 2016-10-07 LAB — PROTEIN ELECTROPHORESIS, SERUM
A/G RATIO SPE: 1.2 (ref 0.7–1.7)
ALBUMIN ELP: 3.2 g/dL (ref 2.9–4.4)
ALPHA-1-GLOBULIN: 0.2 g/dL (ref 0.0–0.4)
Alpha-2-Globulin: 0.6 g/dL (ref 0.4–1.0)
Beta Globulin: 0.8 g/dL (ref 0.7–1.3)
GAMMA GLOBULIN: 0.9 g/dL (ref 0.4–1.8)
GLOBULIN, TOTAL: 2.6 g/dL (ref 2.2–3.9)
TOTAL PROTEIN ELP: 5.8 g/dL — AB (ref 6.0–8.5)

## 2016-10-07 LAB — FOLATE RBC
FOLATE, HEMOLYSATE: 354.9 ng/mL
Folate, RBC: 1268 ng/mL (ref >498)
HEMATOCRIT: 28 % — AB (ref 34.0–46.6)

## 2016-10-07 LAB — ANTINUCLEAR ANTIBODIES, IFA: ANA Ab, IFA: NEGATIVE

## 2016-10-07 LAB — GLOMERULAR BASEMENT MEMBRANE ANTIBODIES: GBM AB: 3 U (ref 0–20)

## 2016-10-07 MED ORDER — SODIUM BICARBONATE 650 MG PO TABS
1300.0000 mg | ORAL_TABLET | Freq: Two times a day (BID) | ORAL | Status: DC
  Administered 2016-10-07: 1300 mg via ORAL
  Filled 2016-10-07: qty 2

## 2016-10-07 MED ORDER — POTASSIUM CHLORIDE CRYS ER 20 MEQ PO TBCR: 20.0000 meq | EXTENDED_RELEASE_TABLET | Freq: Every day | ORAL | Status: DC

## 2016-10-07 MED ORDER — ACETAMINOPHEN 325 MG PO TABS
650.0000 mg | ORAL_TABLET | Freq: Four times a day (QID) | ORAL | Status: DC | PRN
Start: 2016-10-07 — End: 2016-10-07

## 2016-10-07 MED ORDER — POTASSIUM CHLORIDE CRYS ER 10 MEQ PO TBCR: 10.0000 meq | EXTENDED_RELEASE_TABLET | Freq: Every day | ORAL | 0 refills | Status: DC

## 2016-10-07 MED ORDER — SODIUM BICARBONATE 650 MG PO TABS: 1300.0000 mg | ORAL_TABLET | Freq: Two times a day (BID) | ORAL | 0 refills | Status: DC

## 2016-10-07 NOTE — Progress Notes (Signed)
Kim Burns to be D/C'd Home per MD order.  Discussed prescriptions and follow up appointments with the patient. Prescriptions given to patient, medication list explained in detail. Pt verbalized understanding.  Allergies as of 10/07/2016      Reactions   Codeine Other (See Comments)   headache   Doxycycline Other (See Comments)   Pt didn't really remember about reaction.   Gemfibrozil Other (See Comments)   Myalgia    Hydrocodone-acetaminophen Other (See Comments)   Headache   Lotemax [loteprednol Etabonate]    burning   Statins Other (See Comments)   Muscle weakness      Medication List    STOP taking these medications   glimepiride 2 MG tablet Commonly known as:  AMARYL   hydrALAZINE 25 MG tablet Commonly known as:  APRESOLINE   pantoprazole 40 MG tablet Commonly known as:  PROTONIX     TAKE these medications   acetaminophen 500 MG tablet Commonly known as:  TYLENOL Take 1,000 mg by mouth every 6 (six) hours as needed for moderate pain.   azelastine 0.1 % nasal spray Commonly known as:  ASTELIN Place 1 spray into both nostrils at bedtime as needed for rhinitis. Use in each nostril as directed   bisacodyl 10 MG suppository Commonly known as:  DULCOLAX Place 1 suppository (10 mg total) rectally daily. What changed:  when to take this  reasons to take this   calcitRIOL 0.25 MCG capsule Commonly known as:  ROCALTROL Take 0.25 mcg by mouth every Monday, Wednesday, and Friday. At night   cinacalcet 30 MG tablet Commonly known as:  SENSIPAR Take 30 mg by mouth daily with supper.   clobetasol 0.05 % Gel Commonly known as:  TEMOVATE Apply 1 application topically daily as needed (for itching).   clopidogrel 75 MG tablet Commonly known as:  PLAVIX Take 75 mg by mouth every morning.   CREON PO Take 1-2 capsules by mouth 3 (three) times daily as needed (for loose stool). Take 2 caps before meals; take 1 caps before snack.   diclofenac sodium 1 %  Gel Commonly known as:  VOLTAREN Apply 2 g topically 4 (four) times daily as needed (for hand pain).   diphenhydramine-acetaminophen 25-500 MG Tabs tablet Commonly known as:  TYLENOL PM Take 2 tablets by mouth at bedtime as needed (for sleep).   escitalopram 5 MG tablet Commonly known as:  LEXAPRO Take 15 mg by mouth at bedtime.   feeding supplement (PRO-STAT SUGAR FREE 64) Liqd Take 30 mLs by mouth 3 (three) times daily. What changed:  when to take this  reasons to take this   fenofibrate 145 MG tablet Commonly known as:  TRICOR Take 145 mg by mouth every morning.   GENTEAL TEARS OP Apply 1 drop to eye 2 (two) times daily as needed (dry eyes).   gi cocktail Susp suspension Take 30 mLs by mouth 3 (three) times daily as needed for indigestion. Shake well. What changed:  when to take this  additional instructions   insulin aspart 100 UNIT/ML injection Commonly known as:  novoLOG Inject 0-9 Units into the skin 4 (four) times daily -  before meals and at bedtime. What changed:  how much to take  additional instructions   ondansetron 4 MG disintegrating tablet Commonly known as:  ZOFRAN-ODT Take 1 tablet (4 mg total) by mouth every 6 (six) hours as needed for nausea.   polyethylene glycol packet Commonly known as:  MIRALAX / GLYCOLAX Take 17 g by  mouth daily.   potassium chloride 10 MEQ tablet Commonly known as:  K-DUR,KLOR-CON Take 1 tablet (10 mEq total) by mouth daily.   sodium bicarbonate 650 MG tablet Take 2 tablets (1,300 mg total) by mouth 2 (two) times daily.   SYSTANE 0.4-0.3 % Soln Generic drug:  Polyethyl Glycol-Propyl Glycol Place 1 drop into both eyes 2 (two) times daily.   traMADol 50 MG tablet Commonly known as:  ULTRAM Take 2 tablets (100 mg total) by mouth every 12 (twelve) hours as needed for moderate pain.   ULORIC 80 MG Tabs Generic drug:  Febuxostat Take 80 mg by mouth every morning.   Vitamin D (Ergocalciferol) 50000 units Caps  capsule Commonly known as:  DRISDOL Take 50,000 Units by mouth every Wednesday. Wednesdays   Vitamin D3 400 units Caps Take 800 Units by mouth daily.       Vitals:   10/07/16 0509 10/07/16 0700  BP: (!) 147/73 (!) 149/69  Pulse: 64 63  Resp: 20 16  Temp: 98.5 F (36.9 C) 98.7 F (37.1 C)  SpO2: 100% 98%    Skin clean, dry and intact without evidence of skin break down, no evidence of skin tears noted. IV catheter discontinued intact. Site without signs and symptoms of complications. Dressing and pressure applied. Pt denies pain at this time. No complaints noted.  An After Visit Summary was printed and given to the patient. Patient escorted via [place], and D/C home via private auto.  Dixie Dials RN, BSN

## 2016-10-07 NOTE — Progress Notes (Signed)
Subjective: Interval History: has complaints just weak.  Objective: Vital signs in last 24 hours: Temp:  [98.5 F (36.9 C)-98.9 F (37.2 C)] 98.7 F (37.1 C) (08/20 0700) Pulse Rate:  [63-73] 63 (08/20 0700) Resp:  [16-20] 16 (08/20 0700) BP: (106-159)/(69-79) 149/69 (08/20 0700) SpO2:  [98 %-100 %] 98 % (08/20 0700) Weight change:   Intake/Output from previous day: 08/19 0701 - 08/20 0700 In: 2242.5 [P.O.:440; I.V.:1802.5] Out: 1350 [Urine:1350] Intake/Output this shift: No intake/output data recorded.  General appearance: alert, cooperative, no distress and pale Resp: clear to auscultation bilaterally Cardio: S1, S2 normal and systolic murmur: systolic ejection 2/6, decrescendo at 2nd left intercostal space GI: soft, non-tender; bowel sounds normal; no masses,  no organomegaly Extremities: edema 1+  Lab Results:  Recent Labs  10/06/16 0402  WBC 6.4  HGB 8.7*  HCT 26.6*  PLT 259   BMET:  Recent Labs  10/06/16 0402 10/07/16 0452  NA 141 142  K 3.4* 3.3*  CL 110 104  CO2 22 31  GLUCOSE 133* 103*  BUN 68* 63*  CREATININE 4.30* 3.75*  CALCIUM 9.5 9.7   No results for input(s): PTH in the last 72 hours. Iron Studies:  Recent Labs  10/05/16 1310  IRON 49  TIBC 364  FERRITIN 67    Studies/Results: No results found.  I have reviewed the patient's current medications.  Assessment/Plan: 1 CKD 4/AKI improving, acidemia better.  Mild vol xs .  No D.   2 D improved 3 Anemia esa/Fe 4 HPTH vit D 5 Debill P mobilize, po bicarb, stop ivf, ok to d/c from our standpoint    LOS: 4 days   Malaiah Viramontes L 10/07/2016,9:58 AM

## 2016-10-07 NOTE — Discharge Instructions (Addendum)
You were hospitalized with an acute kidney injury. Your kidneys have improved since you have been in the hospital, we have stopped your protonix (as this could have caused injury to the kidneys) and gave you IV fluids. Please stop taking Protonix at home  . Please schedule a follow up appointment with your regular doctor to be seen within the next week.  You were sent with a potassium pill to take daily for the next week until you see your doctor and your potassium levels may be rechecked.   It was a pleasure caring for you, take care!   Acute Kidney Injury, Adult Acute kidney injury is a sudden worsening of kidney function. The kidneys are organs that have several jobs. They filter the blood to remove waste products and extra fluid. They also maintain a healthy balance of minerals and hormones in the body, which helps control blood pressure and keep bones strong. With this condition, your kidneys do not do their jobs as well as they should. This condition ranges from mild to severe. Over time it may develop into long-lasting (chronic) kidney disease. Early detection and treatment may prevent acute kidney injury from developing into a chronic condition. What are the causes? Common causes of this condition include:  A problem with blood flow to the kidneys. This may be caused by: ? Low blood pressure (hypotension) or shock. ? Blood loss. ? Heart and blood vessel (cardiovascular) disease. ? Severe burns. ? Liver disease.  Direct damage to the kidneys. This may be caused by: ? Certain medicines. ? A kidney infection. ? Poisoning. ? Being around or in contact with toxic substances. ? A surgical wound. ? A hard, direct hit to the kidney area.  A sudden blockage of urine flow. This may be caused by: ? Cancer. ? Kidney stones. ? An enlarged prostate in males.  What are the signs or symptoms? Symptoms of this condition may not be obvious until the condition becomes severe. Symptoms of  this condition can include:  Tiredness (lethargy), or difficulty staying awake.  Nausea or vomiting.  Swelling (edema) of the face, legs, ankles, or feet.  Problems with urination, such as: ? Abdominal pain, or pain along the side of your stomach (flank). ? Decreased urine production. ? Decrease in the force of urine flow.  Muscle twitches and cramps, especially in the legs.  Confusion or trouble concentrating.  Loss of appetite.  Fever.  How is this diagnosed? This condition may be diagnosed with tests, including:  Blood tests.  Urine tests.  Imaging tests.  A test in which a sample of tissue is removed from the kidneys to be examined under a microscope (kidney biopsy).  How is this treated? Treatment for this condition depends on the cause and how severe the condition is. In mild cases, treatment may not be needed. The kidneys may heal on their own. In more severe cases, treatment will involve:  Treating the cause of the kidney injury. This may involve changing any medicines you are taking or adjusting your dosage.  Fluids. You may need specialized IV fluids to balance your body's needs.  Having a catheter placed to drain urine and prevent blockages.  Preventing problems from occurring. This may mean avoiding certain medicines or procedures that can cause further injury to the kidneys.  In some cases treatment may also require:  A procedure to remove toxic wastes from the body (dialysis or continuous renal replacement therapy - CRRT).  Surgery. This may be done to repair  a torn kidney, or to remove the blockage from the urinary system.  Follow these instructions at home: Medicines  Take over-the-counter and prescription medicines only as told by your health care provider.  Do not take any new medicines without your health care provider's approval. Many medicines can worsen your kidney damage.  Do not take any vitamin and mineral supplements without your  health care provider's approval. Many nutritional supplements can worsen your kidney damage. Lifestyle  If your health care provider prescribed changes to your diet, follow them. You may need to decrease the amount of protein you eat.  Achieve and maintain a healthy weight. If you need help with this, ask your health care provider.  Start or continue an exercise plan. Try to exercise at least 30 minutes a day, 5 days a week.  Do not use any tobacco products, such as cigarettes, chewing tobacco, and e-cigarettes. If you need help quitting, ask your health care provider. General instructions  Keep track of your blood pressure. Report changes in your blood pressure as told by your health care provider.  Stay up to date with immunizations. Ask your health care provider which immunizations you need.  Keep all follow-up visits as told by your health care provider. This is important. Where to find more information:  American Association of Kidney Patients: BombTimer.gl  National Kidney Foundation: www.kidney.[place]: https://mathis.com/  [facility]: ? www.lifeoptions.org ? www.kidneyschool.org Contact a health care provider if:  Your symptoms get worse.  You develop new symptoms. Get help right away if:  You develop symptoms of worsening kidney disease, which include: ? Headaches. ? Abnormally dark or light skin. ? Easy bruising. ? Frequent hiccups. ? Chest pain. ? Shortness of breath. ? End of menstruation in women. ? Seizures. ? Confusion or altered mental status. ? Abdominal or back pain. ? Itchiness.  You have a fever.  Your body is producing less urine.  You have pain or bleeding when you urinate. Summary  Acute kidney injury is a sudden worsening of kidney function.  Acute kidney injury can be caused by problems with blood flow to the kidneys, direct damage to the kidneys, and sudden blockage of urine flow.  Symptoms of  this condition may not be obvious until it becomes severe. Symptoms may include edema, lethargy, confusion, nausea or vomiting, and problems passing urine.  This condition can usually be diagnosed with blood tests, urine tests, and imaging tests. Sometimes a kidney biopsy is done to diagnose this condition.  Treatment for this condition often involves treating the underlying cause. It is treated with fluids, medicines, dialysis, diet changes, or surgery. This information is not intended to replace advice given to you by your health care provider. Make sure you discuss any questions you have with your health care provider. Document Released: 08/20/2010 Document Revised: 01/26/2016 Document Reviewed: 01/26/2016 Elsevier Interactive Patient Education  2017 [employer].

## 2016-10-07 NOTE — Evaluation (Signed)
Physical Therapy Evaluation Patient Details Name: Kim Burns MRN: [id number] DOB: Dec 14, 1935 Today's Date: 10/07/2016   History of Present Illness  Kim Burns is a 81 y.o. female presenting with acute renal failure noted on outpatient labs drawn yesterday . PMH is significant for pancreatic mass s/p resection 04/2016, diabetes, HTN, hx TIA 1995 on plavix, Anxiety, and GERD.  Clinical Impression   Pt admitted with above diagnosis. Pt currently with functional limitations due to the deficits listed below (see PT Problem List). Overall moving quite well; will plan to take next session to walk with cane (mostly to teach her correct use of cane); she already has a cane at home, and tells me she will use it when she feels particularly unsteady; otherwise, I do not feel strongly that she needs an assistive device; will likely meet PT goals in 1 session;  Pt will benefit from skilled PT to increase their independence and safety with mobility to allow discharge to the venue listed below.       Follow Up Recommendations No PT follow up    Equipment Recommendations  None recommended by PT    Recommendations for Other Services       Precautions / Restrictions Precautions Precautions: Fall Precaution Comments: Fall risk is very slight Restrictions Weight Bearing Restrictions: No      Mobility  Bed Mobility Overal bed mobility: Independent                Transfers Overall transfer level: Independent Equipment used: None                Ambulation/Gait Ambulation/Gait assistance: Supervision Ambulation Distance (Feet): 300 Feet Assistive device: None Gait Pattern/deviations: Step-through pattern;Narrow base of support     General Gait Details: Overall walkign well; cues to self monitor for activity tolerance; Occasionally reaching out for UE support; cues to self-monitor for activity tolerance  Stairs            Wheelchair Mobility    Modified Rankin  (Stroke Patients Only)       Balance                                             Pertinent Vitals/Pain Pain Assessment: No/denies pain    Home Living Family/patient expects to be discharged to:: Private residence Living Arrangements: Alone Available Help at Discharge: Family Type of Home: House Home Access: Stairs to enter   Technical brewer of Steps: 1 Home Layout: Two level        Prior Function Level of Independence: Independent         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     [employer]   Dominant Hand: Right    Extremity/Trunk Assessment   Upper Extremity Assessment Upper Extremity Assessment: Overall WFL for tasks assessed    Lower Extremity Assessment Lower Extremity Assessment: Generalized weakness    Cervical / Trunk Assessment Cervical / Trunk Assessment: Normal  Communication   Communication: No difficulties  Cognition Arousal/Alertness: Awake/alert Behavior During Therapy: WFL for tasks assessed/performed Overall Cognitive Status: Within Functional Limits for tasks assessed                                        General Comments General comments (skin integrity,  edema, etc.): BP sitting prior to walking 157/85, HR 78; BP standing post walk 179/88, HR 78    Exercises     Assessment/Plan    PT Assessment Patient needs continued PT services  PT Problem List Decreased strength;Decreased balance       PT Treatment Interventions DME instruction;Gait training;Stair training;Functional mobility training;Therapeutic activities;Therapeutic exercise    PT Goals (Current goals can be found in the Care Plan section)  Acute Rehab PT Goals Patient Stated Goal: Home, back to sewing soon PT Goal Formulation: With patient Time For Goal Achievement: 10/14/16 Potential to Achieve Goals: Good    Frequency Min 3X/week (will likely meet acute PT goals in 1, maybe 2 sessions)    Barriers to discharge        Co-evaluation               AM-PAC PT "6 Clicks" Daily Activity  Outcome Measure Difficulty turning over in bed (including adjusting bedclothes, sheets and blankets)?: None Difficulty moving from lying on back to sitting on the side of the bed? : None Difficulty sitting down on and standing up from a chair with arms (e.g., wheelchair, bedside commode, etc,.)?: None Help needed moving to and from a bed to chair (including a wheelchair)?: None Help needed walking in hospital room?: None Help needed climbing 3-5 steps with a railing? : A Little 6 Click Score: 23    End of Session Equipment Utilized During Treatment: Gait belt Activity Tolerance: Patient tolerated treatment well Patient left: in chair;with call bell/phone within reach Nurse Communication: Mobility status PT Visit Diagnosis: Unsteadiness on feet (R26.81)    Time: 1209-1229 PT Time Calculation (min) (ACUTE ONLY): 20 min   Charges:   PT Evaluation $PT Eval Low Complexity: 1 Low     PT G Codes:        Roney Marion, PT  Acute Rehabilitation Services Pager [phone] Office [phone]   [place]ta [place] 10/07/2016, 1:21 PM

## 2016-10-07 NOTE — Progress Notes (Signed)
Family Medicine Teaching Service Daily Progress Note Intern Pager: [phone]  Patient name: Kim Burns Medical record number: [id number] Date of birth: Aug 01, 1935 Age: 81 y.o. Gender: female  Primary Care Provider: [name], MD Consultants: nephro Code Status:Full code, confirmed on admission  Pt Overview and Major Events to Date:  Kim Jans Turneris a 81 y.o.femalepresenting with acute renal failure noted on outpatient labs drawn yesterday. PMH is significant for pancreatic mass s/p resection 04/2016, diabetes, HTN, hx TIA 1995 on plavix, Anxiety, and GERD.   Possible diagnosis of chronic interstitial nephritis due to protonix so protonix should be discontinued going forward.  Assessment and Plan: Kim Robins Turneris a 81 y.o.femalepresenting with acute renal failure noted on outpatient labs drawn yesterday. PMH is significant for pancreatic mass s/p resection 04/2016, diabetes, HTN, hx TIA 1995 on plavix, Anxiety, and GERD  Acute renal failure on CKD IV- Improved to 3.75.  Likely combination of pre-renal due to chronic diarrhea (also with some recent low BPs as an outpatient) + intrinsic renal cause (possibly protonix-induced interstitial nephritis) due to FENa 7.5%. Renal US without hydronephrosis. ASO and complement levels normal, making acute GN less likely. Cr 5.31 on admission. - nephro following, appreciate recommendations - holding Protonix due to concern for chronic interstitial nephritis. Will also need to hold at discharge. - avoid nephrotoxic agents -nephro considering vascular access -feraheme  Hyperphosphatemia- resolved to 4.0  Hypokalemia- 3.4 -20mg  kdur BID  Chronic Diarrhea, resolved. No diarrhea for the last 2 days. May be 2/2 pancreatic mass resection, has been chronic since her surgery in 04/2016.  - continue home creon  Diabetes - may be insulin deficiency 2/2 pancreatic procedure, as patient indicates she has had diabetes since her  pancreatic mass resection in 04/2016. Was previously on glimepiride which was stopped by PCP. Takes novolog sliding scale with meals only. CBGs have ranged 101-183 in the last 24 hours. - CBGs QID with meals and bedtime - sensitive sliding scale  HTN- previously on Hydralazine 25mg  tid at home, but was discontinued as an outpatient due to low BPs at clinic visits. 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 Patient reports she continues to follow with ?Surgery for this problem, states she was also meant to have an initial visit with Dr. Paulita Fujita with GI  8/17, however called to cancel this appt due to being in the hospital. -continue creon as noted above -follow-up with GI as an outpatient  History of TIA 1995: -continue Plavix  Anxiety, stable. - Continue home medication lexapro 15 mg qhs.  GERD, stable -holding protonix and will not restart on discharge  FEN/GI:Renal diet Prophylaxis: Heparin  Disposition:discharge pending nephro clearance, potentially waiting for vascular access  Subjective:  Patient still has feeling of losing balance when rising quickly, states she was told about her vestibular shwannoma and that decision was that it was stable and nothing needed to be done about it.    She is interested in discussing vascular access for hemodialysis, "I want to live and if that's what it takes, I want to  do it"  Objective: Temp:  [98.5 F (36.9 C)-98.9 F (37.2 C)]  98.5 F (36.9 C) (08/20 0509) Pulse Rate:  [64-73] 64 (08/20 0509) Resp:  [18-20] 20 (08/20 0509) BP: (106-159)/(73-98) 147/73 (08/20 0509) SpO2:  [98 %-100 %] 100 % (08/20 0509) Physical Exam: General:able to sit up without assistance for exam, pleasant, conversational Eyes: EOMI, no conjunctival pallor or injection, no scleral icterus Neck: no LAD, supple Cardiovascular: RRR, no m/r/g Respiratory: CTA bil, no W/R/R Gastrointestinal: soft, non-tender, non-distended Psych: AAOx3, appropriate affect, pleasant  Laboratory:  Recent Labs Lab 10/03/16 1314 10/04/16 0304 10/06/16 0402  WBC 6.9 7.4 6.4  HGB 9.4* 8.8* 8.7*  HCT 28.6* 26.6* 26.6*  PLT 307 262 259    Recent Labs Lab 10/05/16 0223 10/06/16 0402 10/07/16 0452  NA 140 141 142  K 3.5 3.4* 3.3*  CL 114* 110 104  CO2 17* 22 31  BUN 63* 68* 63*  CREATININE 4.56* 4.30* 3.75*  CALCIUM 9.2 9.5 9.7  GLUCOSE 137* 133* 103*      Imaging/Diagnostic Tests: Dg Chest 2 View  Result Date: 10/03/2016 CLINICAL DATA:  Fatigue. EXAM: CHEST  2 VIEW COMPARISON:  May 22, 2016 FINDINGS: The heart size and mediastinal contours are within normal limits. Both lungs are clear. The visualized skeletal structures are unremarkable. IMPRESSION: No active cardiopulmonary disease. Electronically Signed   By: Dorise Bullion III M.D   On: 10/03/2016 17:36   US Renal  Result Date: 10/04/2016 CLINICAL DATA:  Acute onset of renal failure.  Initial encounter. EXAM: RENAL / URINARY TRACT ULTRASOUND COMPLETE COMPARISON:  Renal ultrasound performed 09/05/2014, and MRI of the abdomen performed 11/20/2015 FINDINGS: Right Kidney: Length: 11.3 cm. Diffusely increased parenchymal echogenicity is noted. Scattered cysts are noted about the right kidney. No hydronephrosis visualized. Left Kidney: Length: 12.3 cm. Diffusely increased parenchymal echogenicity is noted. Scattered cysts are noted about the left kidney. No hydronephrosis visualized. Bladder:  Appears normal for degree of bladder distention. IMPRESSION: 1. No evidence of hydronephrosis. 2. Diffusely increased parenchymal echogenicity raises concern for medical renal disease. 3. Scattered bilateral renal cysts again noted. Electronically Signed   By: Garald Balding M.D.   On: 10/04/2016 00:11     Sherene Sires, DO 10/07/2016, 6:11 AM PGY-1, [place] Intern pager: [phone], text pages welcome

## 2016-10-07 NOTE — Progress Notes (Signed)
FPTS Interim Progress Note  S:Patient denies any current dizziness, lightheaded ness. Resting comfortably on room air.She was seated in her chair.  O: BP (!) 149/69 (BP Location: Right Arm)   Pulse 63   Temp 98.7 F (37.1 C) (Oral)   Resp 16   Ht 5\' 4"  (1.626 m)   Wt 124 lb 12.5 oz (56.6 kg)   SpO2 98%   BMI 21.42 kg/m    Orthostatics: All bp measurements were taken at 3 minutes apart.  Seated: 162/85 Standing: 165/88 Lying: 170/82  A/P: Dizziness: improved. Orthostatics wnl.   Bonnita Hollow, MD 10/07/2016, 3:01 PM PGY-1, [place] Medicine Service pager [phone]

## 2016-10-07 NOTE — Care Management Important Message (Signed)
Important Message  Patient Details  Name: Kim Burns MRN: [id number] Date of Birth: [birth year]   Medicare Important Message Given:  Yes    Jessic Standifer [place] 10/07/2016, 11:13 AM

## 2016-10-08 LAB — MPO/PR-3 (ANCA) ANTIBODIES: Myeloperoxidase Abs: <9 U/mL (ref 0.0–9.0)

## 2016-11-12 ENCOUNTER — Emergency Department (HOSPITAL_COMMUNITY): Payer: Medicare Other

## 2016-11-12 ENCOUNTER — Observation Stay (HOSPITAL_COMMUNITY)
Admission: EM | Admit: 2016-11-12 | Discharge: 2016-11-14 | Disposition: A | Discharge status: Home / Self Care | Payer: Medicare Other | Attending: Internal Medicine | Admitting: Internal Medicine

## 2016-11-12 ENCOUNTER — Encounter (HOSPITAL_COMMUNITY): Payer: Self-pay | Admitting: Emergency Medicine

## 2016-11-12 DIAGNOSIS — N184 Chronic kidney disease, stage 4 (severe): Secondary | ICD-10-CM | POA: Diagnosis not present

## 2016-11-12 DIAGNOSIS — K219 Gastro-esophageal reflux disease without esophagitis: Secondary | ICD-10-CM | POA: Insufficient documentation

## 2016-11-12 DIAGNOSIS — K921 Melena: Principal | ICD-10-CM

## 2016-11-12 DIAGNOSIS — Z794 Long term (current) use of insulin: Secondary | ICD-10-CM | POA: Insufficient documentation

## 2016-11-12 DIAGNOSIS — E213 Hyperparathyroidism, unspecified: Secondary | ICD-10-CM | POA: Insufficient documentation

## 2016-11-12 DIAGNOSIS — N183 Chronic kidney disease, stage 3 unspecified: Secondary | ICD-10-CM | POA: Diagnosis present

## 2016-11-12 DIAGNOSIS — Z881 Allergy status to other antibiotic agents status: Secondary | ICD-10-CM | POA: Diagnosis not present

## 2016-11-12 DIAGNOSIS — Z8673 Personal history of transient ischemic attack (TIA), and cerebral infarction without residual deficits: Secondary | ICD-10-CM | POA: Diagnosis not present

## 2016-11-12 DIAGNOSIS — R42 Dizziness and giddiness: Secondary | ICD-10-CM | POA: Insufficient documentation

## 2016-11-12 DIAGNOSIS — E119 Type 2 diabetes mellitus without complications: Secondary | ICD-10-CM

## 2016-11-12 DIAGNOSIS — Z888 Allergy status to other drugs, medicaments and biological substances status: Secondary | ICD-10-CM | POA: Insufficient documentation

## 2016-11-12 DIAGNOSIS — K922 Gastrointestinal hemorrhage, unspecified: Secondary | ICD-10-CM | POA: Diagnosis present

## 2016-11-12 DIAGNOSIS — Z862 Personal history of diseases of the blood and blood-forming organs and certain disorders involving the immune mechanism: Secondary | ICD-10-CM

## 2016-11-12 DIAGNOSIS — E1122 Type 2 diabetes mellitus with diabetic chronic kidney disease: Secondary | ICD-10-CM | POA: Insufficient documentation

## 2016-11-12 DIAGNOSIS — G4730 Sleep apnea, unspecified: Secondary | ICD-10-CM | POA: Insufficient documentation

## 2016-11-12 DIAGNOSIS — N189 Chronic kidney disease, unspecified: Secondary | ICD-10-CM

## 2016-11-12 DIAGNOSIS — I129 Hypertensive chronic kidney disease with stage 1 through stage 4 chronic kidney disease, or unspecified chronic kidney disease: Secondary | ICD-10-CM | POA: Insufficient documentation

## 2016-11-12 DIAGNOSIS — Z885 Allergy status to narcotic agent status: Secondary | ICD-10-CM | POA: Insufficient documentation

## 2016-11-12 DIAGNOSIS — D649 Anemia, unspecified: Secondary | ICD-10-CM

## 2016-11-12 DIAGNOSIS — E876 Hypokalemia: Secondary | ICD-10-CM | POA: Diagnosis not present

## 2016-11-12 HISTORY — DX: Encounter for other specified aftercare: Z51.89

## 2016-11-12 LAB — COMPREHENSIVE METABOLIC PANEL
ALK PHOS: 27 U/L — AB (ref 38–126)
ALT: 17 U/L (ref 14–54)
AST: 20 U/L (ref 15–41)
Albumin: 3 g/dL — ABNORMAL LOW (ref 3.5–5.0)
Anion gap: 7 (ref 5–15)
BUN: 50 mg/dL — ABNORMAL HIGH (ref 6–20)
CHLORIDE: 111 mmol/L (ref 101–111)
CO2: 19 mmol/L — ABNORMAL LOW (ref 22–32)
CREATININE: 2.83 mg/dL — AB (ref 0.44–1.00)
Calcium: 8.7 mg/dL — ABNORMAL LOW (ref 8.9–10.3)
GFR, EST AFRICAN AMERICAN: 17 mL/min — AB (ref >60)
GFR, EST NON AFRICAN AMERICAN: 15 mL/min — AB (ref >60)
Glucose, Bld: 182 mg/dL — ABNORMAL HIGH (ref 65–99)
Potassium: 3.2 mmol/L — ABNORMAL LOW (ref 3.5–5.1)
Sodium: 137 mmol/L (ref 135–145)
TOTAL PROTEIN: 5.4 g/dL — AB (ref 6.5–8.1)
Total Bilirubin: 0.5 mg/dL (ref 0.3–1.2)

## 2016-11-12 LAB — CBC
HCT: 21.9 % — ABNORMAL LOW (ref 36.0–46.0)
Hemoglobin: 7.1 g/dL — ABNORMAL LOW (ref 12.0–15.0)
MCH: 29.7 pg (ref 26.0–34.0)
MCHC: 32.4 g/dL (ref 30.0–36.0)
MCV: 91.6 fL (ref 78.0–100.0)
PLATELETS: 283 10*3/uL (ref 150–400)
RBC: 2.39 MIL/uL — AB (ref 3.87–5.11)
RDW: 15.2 % (ref 11.5–15.5)
WBC: 9.2 10*3/uL (ref 4.0–10.5)

## 2016-11-12 LAB — PREPARE RBC (CROSSMATCH)

## 2016-11-12 LAB — PROTIME-INR
INR: 1.11
Prothrombin Time: 14.2 seconds (ref 11.4–15.2)

## 2016-11-12 MED ORDER — POTASSIUM CHLORIDE CRYS ER 20 MEQ PO TBCR
20.0000 meq | EXTENDED_RELEASE_TABLET | Freq: Two times a day (BID) | ORAL | Status: DC
  Administered 2016-11-12 – 2016-11-14 (×4): 20 meq via ORAL
  Filled 2016-11-12 (×4): qty 1

## 2016-11-12 MED ORDER — CINACALCET HCL 30 MG PO TABS
30.0000 mg | ORAL_TABLET | Freq: Every day | ORAL | Status: DC
  Administered 2016-11-13: 30 mg via ORAL
  Filled 2016-11-12 (×2): qty 1

## 2016-11-12 MED ORDER — SODIUM BICARBONATE 650 MG PO TABS
1300.0000 mg | ORAL_TABLET | Freq: Two times a day (BID) | ORAL | Status: DC
  Administered 2016-11-13 – 2016-11-14 (×3): 1300 mg via ORAL
  Filled 2016-11-12 (×3): qty 2

## 2016-11-12 MED ORDER — TIMOLOL MALEATE 0.5 % OP SOLN
1.0000 [drp] | Freq: Two times a day (BID) | OPHTHALMIC | Status: DC
  Administered 2016-11-13 – 2016-11-14 (×3): 1 [drp] via OPHTHALMIC
  Filled 2016-11-12: qty 5

## 2016-11-12 MED ORDER — POTASSIUM CHLORIDE CRYS ER 10 MEQ PO TBCR: 10.0000 meq | EXTENDED_RELEASE_TABLET | Freq: Every day | ORAL | Status: DC

## 2016-11-12 MED ORDER — CALCITRIOL 0.25 MCG PO CAPS
0.2500 ug | ORAL_CAPSULE | ORAL | Status: DC
  Administered 2016-11-13: 0.25 ug via ORAL
  Filled 2016-11-12: qty 1

## 2016-11-12 MED ORDER — BRIMONIDINE TARTRATE-TIMOLOL 0.2-0.5 % OP SOLN: 1.0000 [drp] | Freq: Two times a day (BID) | OPHTHALMIC | Status: DC

## 2016-11-12 MED ORDER — VALACYCLOVIR HCL 500 MG PO TABS
500.0000 mg | ORAL_TABLET | Freq: Every day | ORAL | Status: DC
  Administered 2016-11-13 – 2016-11-14 (×2): 500 mg via ORAL
  Filled 2016-11-12 (×2): qty 1

## 2016-11-12 MED ORDER — ONDANSETRON HCL 4 MG/2ML IJ SOLN: 4.0000 mg | Freq: Four times a day (QID) | INTRAMUSCULAR | Status: DC | PRN

## 2016-11-12 MED ORDER — PANTOPRAZOLE SODIUM 40 MG IV SOLR
40.0000 mg | Freq: Once | INTRAVENOUS | Status: AC
  Administered 2016-11-12: 40 mg via INTRAVENOUS
  Filled 2016-11-12: qty 40

## 2016-11-12 MED ORDER — ZOLPIDEM TARTRATE 5 MG PO TABS: 5.0000 mg | ORAL_TABLET | Freq: Every evening | ORAL | Status: DC | PRN

## 2016-11-12 MED ORDER — POLYVINYL ALCOHOL 1.4 % OP SOLN: 1.0000 [drp] | Freq: Two times a day (BID) | OPHTHALMIC | Status: DC | PRN

## 2016-11-12 MED ORDER — INSULIN ASPART 100 UNIT/ML ~~LOC~~ SOLN
0.0000 [IU] | SUBCUTANEOUS | Status: DC
  Administered 2016-11-13 – 2016-11-14 (×2): 2 [IU] via SUBCUTANEOUS

## 2016-11-12 MED ORDER — ACETAMINOPHEN 325 MG PO TABS
650.0000 mg | ORAL_TABLET | Freq: Four times a day (QID) | ORAL | Status: DC | PRN
Start: 2016-11-12 — End: 2016-11-14

## 2016-11-12 MED ORDER — FENTANYL CITRATE (PF) 100 MCG/2ML IJ SOLN: 25.0000 ug | INTRAMUSCULAR | Status: DC | PRN

## 2016-11-12 MED ORDER — PREDNISOLONE ACETATE 1 % OP SUSP
1.0000 [drp] | Freq: Four times a day (QID) | OPHTHALMIC | Status: DC
  Administered 2016-11-13 – 2016-11-14 (×5): 1 [drp] via OPHTHALMIC
  Filled 2016-11-12: qty 1

## 2016-11-12 MED ORDER — SODIUM CHLORIDE 0.9% FLUSH
3.0000 mL | Freq: Two times a day (BID) | INTRAVENOUS | Status: DC
  Administered 2016-11-12 – 2016-11-14 (×4): 3 mL via INTRAVENOUS

## 2016-11-12 MED ORDER — PANTOPRAZOLE SODIUM 40 MG IV SOLR: 40.0000 mg | Freq: Two times a day (BID) | INTRAVENOUS | Status: DC

## 2016-11-12 MED ORDER — ACETAMINOPHEN 650 MG RE SUPP: 650.0000 mg | Freq: Four times a day (QID) | RECTAL | Status: DC | PRN

## 2016-11-12 MED ORDER — ESCITALOPRAM OXALATE 10 MG PO TABS
15.0000 mg | ORAL_TABLET | Freq: Every day | ORAL | Status: DC
  Administered 2016-11-13: 15 mg via ORAL
  Filled 2016-11-12: qty 2

## 2016-11-12 MED ORDER — SODIUM CHLORIDE 0.9 % IV SOLN
8.0000 mg/h | INTRAVENOUS | Status: DC
  Administered 2016-11-12: 8 mg/h via INTRAVENOUS
  Filled 2016-11-12 (×4): qty 80

## 2016-11-12 MED ORDER — ONDANSETRON HCL 4 MG PO TABS: 4.0000 mg | ORAL_TABLET | Freq: Four times a day (QID) | ORAL | Status: DC | PRN

## 2016-11-12 MED ORDER — BRIMONIDINE TARTRATE 0.2 % OP SOLN
1.0000 [drp] | Freq: Two times a day (BID) | OPHTHALMIC | Status: DC
  Administered 2016-11-13 – 2016-11-14 (×3): 1 [drp] via OPHTHALMIC
  Filled 2016-11-12: qty 5

## 2016-11-12 NOTE — ED Triage Notes (Signed)
Pt sent by doctor, c/o dark stools beginning last night, today her stools were bright red, pt takes plavix. Pt states when she stands she feels dizzy, some shortness of breath.

## 2016-11-12 NOTE — H&P (Signed)
History and Physical    MAXX CALAWAY CWC:[id number] DOB: 1935/05/06 DOA: 11/12/2016  PCP: [name], MD   Patient coming from: Home  Chief Complaint: Near-syncope, dark stool since last night, now some red blood per rectum  HPI: Kim Burns is a 81 y.o. female with medical history significant for anemia of chronic disease, chronic kidney disease stage IV, history of stroke, and recent zoster involving her face and left eye followed by ophthalmology, now presenting to the emergency department for evaluation of near syncope upon standing, melena, and now bright red blood per rectum. Patient reports that she has been experiencing worsening lightheadedness for about a week now. Yesterday, she noted her stool to be black, the first time she had seen this. She went on to have multiple soft black stools and began to see some bright red blood as well overnight. She denies losing consciousness. She takes Plavix, but no anticoagulation. She denies similar symptoms previously. No fevers or chills. Reports lower abdominal pain with defecation, but no upper abdominal pain or indigestion. No vomiting.  ED Course: Upon arrival to the ED, patient is found to be afebrile, saturating well on room air, and with vitals otherwise stable. EKG features a normal sinus rhythm with low voltage QRS. Chest x-ray is notable for hyperinflated lungs, but negative for acute cardiopulmonary disease. Chemistry panel reveals a potassium of 3.2, BUN of 50, and serum creatinine of 2.83, down from 3.73 last month. CBC is notable for a home acidic anemia with hemoglobin of 7.1, down from 8.7 last month. Type and screen was performed and 40 mg IV Protonix was administered in the emergency department. Gastroenterology was consulted by the ED physician. Patient remained hemodynamically stable and has only been in respiratory distress with exertion. She will be admitted to the stepdown unit for ongoing evaluation and management  of acute GI bleed, initially with melena, now with some bright red blood per rectum concerning for possible brisk upper GI bleed.  Review of Systems:  All other systems reviewed and apart from HPI, are negative.  Past Medical History:  Diagnosis Date  . Anemia of chronic disease 1985   hysterectomy fibroid tumors  . Blood transfusion without reported diagnosis   . Bradycardia   . CKD (chronic kidney disease) stage 3, GFR 30-59 ml/min    sees dr Arty Baumgartner sees next in jan 2018  . Dysrhythmia 04/16/2016   bradycardia due to medication   . GERD (gastroesophageal reflux disease)   . Gout   . Headache   . History of kidney stones 06/2013  . Hyperparathyroidism ([place])   . Hypertension   . Itching    last year  . PONV (postoperative nausea and vomiting)   . Sleep apnea    no cpap machine. could not tolerate  . Stroke ([place]) 04/16/2016   TIA 1995  . Thyroid disease   . TIA (transient ischemic attack) 1996   x 1  . Vitamin D deficiency     Past Surgical History:  Procedure Laterality Date  . ABDOMINAL HYSTERECTOMY     complete  . APPENDECTOMY     with gallbladder  . BACK SURGERY     lower  . BREAST LUMPECTOMY Left x 2   many years apart, benign  . CHOLECYSTECTOMY    . CYSTOSCOPY WITH URETEROSCOPY AND STENT PLACEMENT Left 06/18/2013   Procedure: CYSTOSCOPY WITH Lef URETEROSCOPY AND Left STENT PLACEMENT;  Surgeon: Dutch Gray, MD;  Location: [facility];  Service: Urology;  Laterality:  Left;  . EUS N/A 02/07/2016   Procedure: ESOPHAGEAL ENDOSCOPIC ULTRASOUND (EUS) RADIAL;  Surgeon: Arta Silence, MD;  Location: [facility];  Service: Endoscopy;  Laterality: N/A;  . EYE SURGERY Bilateral 2014   ioc for catracts   . PARATHYROID EXPLORATION    . WHIPPLE PROCEDURE N/A 04/23/2016   Procedure: WHIPPLE PROCEDURE;  Surgeon: Stark Klein, MD;  Location: [facility];  Service: General;  Laterality: N/A;     reports that she has never smoked. She has never used smokeless tobacco. She reports that  she does not drink alcohol or use drugs.  Allergies  Allergen Reactions  . Codeine Other (See Comments)    headache  . Doxycycline Other (See Comments)    Pt didn't really remember about reaction.  . Gemfibrozil Other (See Comments)    Myalgia   . Hydrocodone-Acetaminophen Other (See Comments)    Headache  . Lotemax [Loteprednol Etabonate]     burning  . Statins Other (See Comments)    Muscle weakness    Family History  Problem Relation Age of Onset  . Heart disease Mother   . Stroke Mother   . Cancer Father        colon  . Alzheimer's disease Sister   . Cancer Brother        brain  . Alzheimer's disease Sister      Prior to Admission medications   Medication Sig Start Date End Date Taking? Authorizing Provider  acetaminophen (TYLENOL) 500 MG tablet Take 1,000 mg by mouth every 6 (six) hours as needed for moderate pain.    [provider]  Alum & Mag Hydroxide-Simeth (GI COCKTAIL) SUSP suspension Take 30 mLs by mouth 3 (three) times daily as needed for indigestion. Shake well. Patient taking differently: Take 30 mLs by mouth 2 (two) times daily as needed for indigestion. Shake well. 05/03/16   Stark Klein, MD  Amino Acids-Protein Hydrolys (FEEDING SUPPLEMENT, PRO-STAT SUGAR FREE 64,) LIQD Take 30 mLs by mouth 3 (three) times daily. Patient taking differently: Take 30 mLs by mouth daily as needed (for nutrient).  05/06/16   Stark Klein, MD  Artificial Tear Solution (GENTEAL TEARS OP) Apply 1 drop to eye 2 (two) times daily as needed (dry eyes).     [provider]  azelastine (ASTELIN) 0.1 % nasal spray Place 1 spray into both nostrils at bedtime as needed for rhinitis. Use in each nostril as directed    [provider]  bisacodyl (DULCOLAX) 10 MG suppository Place 1 suppository (10 mg total) rectally daily. Patient taking differently: Place 10 mg rectally daily as needed for mild constipation.  05/04/16   Stark Klein, MD  calcitRIOL (ROCALTROL)  0.25 MCG capsule Take 0.25 mcg by mouth every Monday, Wednesday, and Friday. At night    [provider]  Cholecalciferol (VITAMIN D3) 400 units CAPS Take 800 Units by mouth daily.     [provider]  cinacalcet (SENSIPAR) 30 MG tablet Take 30 mg by mouth daily with supper.    [provider]  clopidogrel (PLAVIX) 75 MG tablet Take 75 mg by mouth every morning.    [provider]  diclofenac sodium (VOLTAREN) 1 % GEL Apply 2 g topically 4 (four) times daily as needed (for hand pain).     [provider]  diphenhydramine-acetaminophen (TYLENOL PM) 25-500 MG TABS Take 2 tablets by mouth at bedtime as needed (for sleep).     [provider]  escitalopram (LEXAPRO) 5 MG tablet Take 15  mg by mouth at bedtime.     [provider]  Febuxostat (ULORIC) 80 MG TABS Take 80 mg by mouth every morning.     [provider]  fenofibrate (TRICOR) 145 MG tablet Take 145 mg by mouth every morning.     [provider]  insulin aspart (NOVOLOG) 100 UNIT/ML injection Inject 0-9 Units into the skin 4 (four) times daily -  before meals and at bedtime. Patient taking differently: Inject 2-3 Units into the skin 4 (four) times daily -  before meals and at bedtime. Per pt: BS 150 - 200 = 2 Units; BS >200 = 3 units. 05/06/16   Stark Klein, MD  ondansetron (ZOFRAN-ODT) 4 MG disintegrating tablet Take 1 tablet (4 mg total) by mouth every 6 (six) hours as needed for nausea. Patient not taking: Reported on 10/03/2016 05/03/16   Stark Klein, MD  Pancrelipase, Lip-Prot-Amyl, (CREON PO) Take 1-2 capsules by mouth 3 (three) times daily as needed (for loose stool). Take 2 caps before meals; take 1 caps before snack.    [provider]  Polyethyl Glycol-Propyl Glycol (SYSTANE) 0.4-0.3 % SOLN Place 1 drop into both eyes 2 (two) times daily.    [provider]  polyethylene glycol (MIRALAX / GLYCOLAX) packet Take 17 g by mouth  daily. Patient not taking: Reported on 10/03/2016 05/04/16   Stark Klein, MD  potassium chloride (K-DUR,KLOR-CON) 10 MEQ tablet Take 1 tablet (10 mEq total) by mouth daily. 10/08/16   Everrett Coombe, MD  sodium bicarbonate 650 MG tablet Take 2 tablets (1,300 mg total) by mouth 2 (two) times daily. 10/07/16   Carlyle Dolly, MD  traMADol (ULTRAM) 50 MG tablet Take 2 tablets (100 mg total) by mouth every 12 (twelve) hours as needed for moderate pain. Patient not taking: Reported on 10/03/2016 05/03/16   Stark Klein, MD  Vitamin D, Ergocalciferol, (DRISDOL) 50000 UNITS CAPS capsule Take 50,000 Units by mouth every Wednesday. Wednesdays     [provider]    Physical Exam: Vitals:   11/12/16 1647 11/12/16 1951 11/12/16 2000  BP: 133/73 (!) 165/111 110/86  Pulse: 75 86 80  Resp: 20 18   Temp: 98 F (36.7 C) 97.6 F (36.4 C)   TempSrc: Oral Oral   SpO2: 100% 100% 100%  Weight: 54.4 kg (120 lb)    Height: 5\' 3"  (1.6 m)        Constitutional: NAD, calm, in apparent discomfort Eyes: PERTLA, lids and conjunctivae normal ENMT: Mucous membranes are moist. Posterior pharynx clear of any exudate or lesions.   Neck: normal, supple, no masses, no thyromegaly Respiratory: clear to auscultation bilaterally, no wheezing, no crackles. Normal respiratory effort.   Cardiovascular: S1 & S2 heard, regular rate and rhythm, hyperdynamic precordium. No extremity edema.  Abdomen: No distension, soft, mild generalized tenderness, no rebound pain or guarding. Bowel sounds active.  Musculoskeletal: no clubbing / cyanosis. No joint deformity upper and lower extremities.   Skin: no significant rashes, lesions, ulcers. Warm, dry, well-perfused. Neurologic: CN 2-12 grossly intact. Sensation intact. Strength 5/5 in all 4 limbs.  Psychiatric: Alert and oriented x 3. Calm, cooperative.     Labs on Admission: I have personally reviewed following labs and imaging studies  CBC:  Recent Labs Lab  11/12/16 1650  WBC 9.2  HGB 7.1*  HCT 21.9*  MCV 91.6  PLT 784   Basic Metabolic Panel:  Recent Labs Lab 11/12/16 1650  NA 137  K 3.2*  CL 111  CO2  19*  GLUCOSE 182*  BUN 50*  CREATININE 2.83*  CALCIUM 8.7*   GFR: Estimated Creatinine Clearance: 13.3 mL/min (A) (by C-G formula based on SCr of 2.83 mg/dL (H)). Liver Function Tests:  Recent Labs Lab 11/12/16 1650  AST 20  ALT 17  ALKPHOS 27*  BILITOT 0.5  PROT 5.4*  ALBUMIN 3.0*   No results for input(s): LIPASE, AMYLASE in the last 168 hours. No results for input(s): AMMONIA in the last 168 hours. Coagulation Profile: No results for input(s): INR, PROTIME in the last 168 hours. Cardiac Enzymes: No results for input(s): CKTOTAL, CKMB, CKMBINDEX, TROPONINI in the last 168 hours. BNP (last 3 results) No results for input(s): PROBNP in the last 8760 hours. HbA1C: No results for input(s): HGBA1C in the last 72 hours. CBG: No results for input(s): GLUCAP in the last 168 hours. Lipid Profile: No results for input(s): CHOL, HDL, LDLCALC, TRIG, CHOLHDL, LDLDIRECT in the last 72 hours. Thyroid Function Tests: No results for input(s): TSH, T4TOTAL, FREET4, T3FREE, THYROIDAB in the last 72 hours. Anemia Panel: No results for input(s): VITAMINB12, FOLATE, FERRITIN, TIBC, IRON, RETICCTPCT in the last 72 hours. Urine analysis:    Component Value Date/Time   COLORURINE STRAW (A) 10/03/2016 Ali Chuk 10/03/2016 1755   LABSPEC 1.006 10/03/2016 1755   PHURINE 5.0 10/03/2016 1755   GLUCOSEU NEGATIVE 10/03/2016 1755   HGBUR SMALL (A) 10/03/2016 1755   BILIRUBINUR NEGATIVE 10/03/2016 1755   KETONESUR NEGATIVE 10/03/2016 1755   PROTEINUR NEGATIVE 10/03/2016 1755   UROBILINOGEN 1.0 06/18/2013 1633   NITRITE NEGATIVE 10/03/2016 1755   LEUKOCYTESUR SMALL (A) 10/03/2016 1755   Sepsis Labs: @LABRCNTIP (procalcitonin:4,lacticidven:4) )No results found for this or any previous visit (from the past 240 hour(s)).    Radiological Exams on Admission: Dg Chest 2 View  Result Date: 11/12/2016 CLINICAL DATA:  Dyspnea EXAM: CHEST  2 VIEW COMPARISON:  10/03/2016 chest radiograph. FINDINGS: Stable cardiomediastinal silhouette with normal heart size and aortic atherosclerosis. No pneumothorax. No pleural effusion. Hyperinflated lungs. No pulmonary edema. No acute consolidative airspace disease. Mild eventrations of the hemidiaphragms bilaterally. Stable surgical clip in the right upper quadrant. Stable curvilinear catheter fragment in the upper abdomen. IMPRESSION: Hyperinflated lungs, which may indicate obstructive lung disease. Otherwise no active disease in the chest. Electronically Signed   By: Ilona Sorrel M.D.   On: 11/12/2016 17:14    EKG: Independently reviewed. Normal sinus rhythm, low-voltage QRS.   Assessment/Plan  1. Acute GI bleed, symptomatic anemia    - Pt presents with lightheadedness, fatigue, DOE, melena, and now red blood per rectum  - Found to have Hgb of 7.1, down from 8.7 last month   - Denies N/V, denies LOC or chest pain  - She was treated with 40 mg IV Protonix in ED  - GI is consulting and much appreciated  - Plan to transfuse 1 unit, check post-transfusion H&H, start Protonix infusion, hold Plavix, monitor in SDU initially   2. CKD stage IV - SCr is 2.83 on admission, down from 3.73 last month   - She appeared hypovolemic on admission and is being transfused RBC's and provided IVF hydration  - Repeat chem panel in am   3. History of CVA  - No deficits identified  - Hold Plavix in light of GIB   4. Hypokalemia  - Serum potassium is 3.2 on admission  - Treated with 20 mEq oral supplementation  - Continue cardiac monitoring and repeat chem panel in am  5. Insulin-dependent DM - A1c was 5.4% in March 2018  - Managed at home with Humalog 2-4 units TID per sliding-scale  - Plan to check CBG q4h and start a low-intensity SSI with Novolog     DVT prophylaxis: SCD's Code  Status: Full  Family Communication: Discussed with patient Disposition Plan: Admit to SDU Consults called: Gastroenterology Admission status: Inpatient    Vianne Bulls, MD [facility] Pager [phone]  If 7PM-7AM, please contact night-coverage [email or web address] Password [facility]  11/12/2016, 8:33 PM

## 2016-11-12 NOTE — ED Provider Notes (Signed)
[facility] DEPT Provider Note   CSN: [id number] Arrival date & time: 11/12/16  1622     History   Chief Complaint Chief Complaint  Patient presents with  . GI Bleeding    HPI Kim Burns is a 81 y.o. female.  HPI  81 year old female with a history of chronic kidney disease, anemia, Whipple procedure in March 2018 presents with acute melena. She states she was diagnosed with shingles last week. Placed on valacyclovir 1000 mg 3 times per day. Has been feeling progressively dizzy and lightheaded since. Yesterday she noticed black stool with some red blood. She has had for the stools. They are soft but not diarrhea. Will get some brief lower abdominal pain while going to the bathroom but then it resolves. No current pain. Has shortness of breath with exertion but not at rest. No chest pain. She is on Plavix and has not been on aspirin or NSAIDs.   Past Medical History:  Diagnosis Date  . Anemia of chronic disease 1985   hysterectomy fibroid tumors  . Blood transfusion without reported diagnosis   . Bradycardia   . CKD (chronic kidney disease) stage 3, GFR 30-59 ml/min    sees dr Arty Baumgartner sees next in jan 2018  . Dysrhythmia 04/16/2016   bradycardia due to medication   . GERD (gastroesophageal reflux disease)   . Gout   . Headache   . History of kidney stones 06/2013  . Hyperparathyroidism ([place])   . Hypertension   . Itching    last year  . PONV (postoperative nausea and vomiting)   . Sleep apnea    no cpap machine. could not tolerate  . Stroke ([place]) 04/16/2016   TIA 1995  . Thyroid disease   . TIA (transient ischemic attack) 1996   x 1  . Vitamin D deficiency     Patient Active Problem List   Diagnosis Date Noted  . Acute GI bleeding 11/12/2016  . Hypokalemia 11/12/2016  . Acute renal failure superimposed on chronic kidney disease ([place])   . Acute renal failure (ARF) ([place]) 10/03/2016  . GERD (gastroesophageal reflux disease) 04/29/2016  . Hyperglycemia  04/29/2016  . IPMN (intraductal papillary mucinous neoplasm) 04/23/2016  . History of stroke 04/16/2016  . Sepsis ([place]) 06/18/2013  . Localized swelling, mass and lump, neck 03/29/2013  . Sinus bradycardia 09/18/2012  . Fatigue 09/18/2012  . CKD (chronic kidney disease) stage 3, GFR 30-59 ml/min 09/18/2012    Past Surgical History:  Procedure Laterality Date  . ABDOMINAL HYSTERECTOMY     complete  . APPENDECTOMY     with gallbladder  . BACK SURGERY     lower  . BREAST LUMPECTOMY Left x 2   many years apart, benign  . CHOLECYSTECTOMY    . CYSTOSCOPY WITH URETEROSCOPY AND STENT PLACEMENT Left 06/18/2013   Procedure: CYSTOSCOPY WITH Lef URETEROSCOPY AND Left STENT PLACEMENT;  Surgeon: Dutch Gray, MD;  Location: [facility];  Service: Urology;  Laterality: Left;  . EUS N/A 02/07/2016   Procedure: ESOPHAGEAL ENDOSCOPIC ULTRASOUND (EUS) RADIAL;  Surgeon: Arta Silence, MD;  Location: [facility];  Service: Endoscopy;  Laterality: N/A;  . EYE SURGERY Bilateral 2014   ioc for catracts   . PARATHYROID EXPLORATION    . WHIPPLE PROCEDURE N/A 04/23/2016   Procedure: WHIPPLE PROCEDURE;  Surgeon: Stark Klein, MD;  Location: [facility];  Service: General;  Laterality: N/A;    OB History    No data available       Home Medications  Prior to Admission medications   Medication Sig Start Date End Date Taking? Authorizing Provider  acetaminophen (TYLENOL) 325 MG tablet Take 325 mg by mouth every 6 (six) hours as needed for moderate pain.    Yes [provider]  Amino Acids-Protein Hydrolys (FEEDING SUPPLEMENT, PRO-STAT SUGAR FREE 64,) LIQD Take 30 mLs by mouth 3 (three) times daily. Patient taking differently: Take 30 mLs by mouth daily as needed (for nutrient).  05/06/16  Yes Stark Klein, MD  Artificial Tear Solution (GENTEAL TEARS OP) Place 1 drop into the right eye 2 (two) times daily as needed (dry eyes).    Yes [provider]  azelastine (ASTELIN) 0.1 % nasal spray Place 1  spray into both nostrils at bedtime as needed for rhinitis. Use in each nostril as directed   Yes [provider]  brimonidine-timolol (COMBIGAN) 0.2-0.5 % ophthalmic solution Place 1 drop into the left eye every 12 (twelve) hours.   Yes [provider]  calcitRIOL (ROCALTROL) 0.25 MCG capsule Take 0.25 mcg by mouth every Monday, Wednesday, and Friday. At night   Yes [provider]  cinacalcet (SENSIPAR) 30 MG tablet Take 30 mg by mouth daily with supper.   Yes [provider]  clopidogrel (PLAVIX) 75 MG tablet Take 75 mg by mouth every morning.   Yes [provider]  diclofenac sodium (VOLTAREN) 1 % GEL Apply 2 g topically 4 (four) times daily as needed (for hand pain).    Yes [provider]  diphenhydramine-acetaminophen (TYLENOL PM) 25-500 MG TABS Take 2 tablets by mouth at bedtime as needed (for sleep).    Yes [provider]  escitalopram (LEXAPRO) 5 MG tablet Take 15 mg by mouth at bedtime.    Yes [provider]  fenofibrate (TRICOR) 145 MG tablet Take 72.5 mg by mouth every morning.    Yes [provider]  insulin lispro (HUMALOG) 100 UNIT/ML injection Inject into the skin 3 (three) times daily before meals. Sliding scale before meals three times a day  150-200 Give 2 units 200-250 give 3 units 250-300 give 4 units   Yes [provider]  Pancrelipase, Lip-Prot-Amyl, (CREON PO) Take 1-2 capsules by mouth 3 (three) times daily as needed (for loose stool). Take 2 caps before meals; take 1 caps before snack.   Yes [provider]  potassium chloride (K-DUR,KLOR-CON) 10 MEQ tablet Take 1 tablet (10 mEq total) by mouth daily. 10/08/16  Yes Everrett Coombe, MD  prednisoLONE acetate (PRED FORTE) 1 % ophthalmic suspension Place 1 drop into the left eye 4 (four) times daily.   Yes [provider]  sodium bicarbonate 650 MG tablet Take 2 tablets (1,300 mg total) by mouth 2 (two) times daily. 10/07/16   Yes Carlyle Dolly, MD  valACYclovir (VALTREX) 500 MG tablet Take 500 mg by mouth daily.   Yes [provider]  Alum & Mag Hydroxide-Simeth (GI COCKTAIL) SUSP suspension Take 30 mLs by mouth 3 (three) times daily as needed for indigestion. Shake well. Patient not taking: Reported on 11/12/2016 05/03/16   Stark Klein, MD  bisacodyl (DULCOLAX) 10 MG suppository Place 1 suppository (10 mg total) rectally daily. Patient not taking: Reported on 11/12/2016 05/04/16   Stark Klein, MD  ondansetron (ZOFRAN-ODT) 4 MG disintegrating tablet Take 1 tablet (4 mg total) by mouth every 6 (six) hours as needed for nausea. Patient not taking: Reported on 10/03/2016 05/03/16   Stark Klein, MD  polyethylene glycol [facility] / Floria Raveling) packet Take 17 g  by mouth daily. Patient not taking: Reported on 10/03/2016 05/04/16   Stark Klein, MD    Family History Family History  Problem Relation Age of Onset  . Heart disease Mother   . Stroke Mother   . Cancer Father        colon  . Alzheimer's disease Sister   . Cancer Brother        brain  . Alzheimer's disease Sister     Social History Social History  Substance Use Topics  . Smoking status: Never Smoker  . Smokeless tobacco: Never Used  . Alcohol use No     Allergies   Codeine; Doxycycline; Gemfibrozil; Hydrocodone-acetaminophen; Lotemax [loteprednol etabonate]; and Statins   Review of Systems Review of Systems  Constitutional: Positive for fatigue. Negative for fever.  Respiratory: Positive for shortness of breath.   Cardiovascular: Negative for chest pain.  Gastrointestinal: Positive for abdominal pain and blood in stool. Negative for diarrhea and vomiting.  Neurological: Positive for dizziness and light-headedness.  All other systems reviewed and are negative.    Physical Exam Updated Vital Signs BP 138/79   Pulse 70   Temp 97.6 F (36.4 C) (Oral)   Resp 18   Ht 5\' 3"  (1.6 m)   Wt 54.4 kg (120 lb)   SpO2 100%   BMI  21.26 kg/m   Physical Exam  Constitutional: She is oriented to person, place, and time. She appears well-developed and well-nourished. No distress.  HENT:  Head: Normocephalic and atraumatic.  Right Ear: External ear normal.  Left Ear: External ear normal.  Nose: Nose normal.  Eyes: Right eye exhibits no discharge. Left eye exhibits no discharge.  Cardiovascular: Normal rate, regular rhythm and normal heart sounds.   Pulmonary/Chest: Effort normal and breath sounds normal. She has no rales.  Abdominal: Soft. She exhibits no distension. There is no tenderness.  Genitourinary: Rectal exam shows external hemorrhoid (nonthrombosed) and guaiac positive stool (black and dark red).  Neurological: She is alert and oriented to person, place, and time.  Skin: Skin is warm and dry. She is not diaphoretic.  Nursing note and vitals reviewed.    ED Treatments / Results  Labs (all labs ordered are listed, but only abnormal results are displayed) Labs Reviewed  COMPREHENSIVE METABOLIC PANEL - Abnormal; Notable for the following:       Result Value   Potassium 3.2 (*)    CO2 19 (*)    Glucose, Bld 182 (*)    BUN 50 (*)    Creatinine, Ser 2.83 (*)    Calcium 8.7 (*)    Total Protein 5.4 (*)    Albumin 3.0 (*)    Alkaline Phosphatase 27 (*)    GFR calc non Af Amer 15 (*)    GFR calc Af Amer 17 (*)    All other components within normal limits  CBC - Abnormal; Notable for the following:    RBC 2.39 (*)    Hemoglobin 7.1 (*)    HCT 21.9 (*)    All other components within normal limits  BASIC METABOLIC PANEL  PROTIME-INR  POC OCCULT BLOOD, ED  TYPE AND SCREEN  PREPARE RBC (CROSSMATCH)    EKG  EKG Interpretation  Date/Time:  Tuesday November 12 2016 16:57:56 EDT Ventricular Rate:  82 PR Interval:  126 QRS Duration: 86 QT Interval:  382 QTC Calculation: 446 R Axis:   27 Text Interpretation:  Normal sinus rhythm Low voltage QRS Nonspecific T wave abnormality Abnormal ECG no  significant  change since Aug 2018 Confirmed by Sherwood Gambler [phone]) on 11/12/2016 7:54:47 PM       Radiology Dg Chest 2 View  Result Date: 11/12/2016 CLINICAL DATA:  Dyspnea EXAM: CHEST  2 VIEW COMPARISON:  10/03/2016 chest radiograph. FINDINGS: Stable cardiomediastinal silhouette with normal heart size and aortic atherosclerosis. No pneumothorax. No pleural effusion. Hyperinflated lungs. No pulmonary edema. No acute consolidative airspace disease. Mild eventrations of the hemidiaphragms bilaterally. Stable surgical clip in the right upper quadrant. Stable curvilinear catheter fragment in the upper abdomen. IMPRESSION: Hyperinflated lungs, which may indicate obstructive lung disease. Otherwise no active disease in the chest. Electronically Signed   By: Ilona Sorrel M.D.   On: 11/12/2016 17:14    Procedures Procedures (including critical care time)  Medications Ordered in ED Medications  sodium chloride flush (NS) 0.9 % injection 3 mL (not administered)  acetaminophen (TYLENOL) tablet 650 mg (not administered)    Or  acetaminophen (TYLENOL) suppository 650 mg (not administered)  ondansetron (ZOFRAN) tablet 4 mg (not administered)    Or  ondansetron (ZOFRAN) injection 4 mg (not administered)  fentaNYL (SUBLIMAZE) injection 25-50 mcg (not administered)  pantoprazole (PROTONIX) 80 mg in sodium chloride 0.9 % 250 mL (0.32 mg/mL) infusion (not administered)  pantoprazole (PROTONIX) injection 40 mg (not administered)  pantoprazole (PROTONIX) injection 40 mg (not administered)  brimonidine-timolol (COMBIGAN) 0.2-0.5 % ophthalmic solution 1 drop (not administered)  prednisoLONE acetate (PRED FORTE) 1 % ophthalmic suspension 1 drop (not administered)  valACYclovir (VALTREX) tablet 500 mg (not administered)  potassium chloride (K-DUR,KLOR-CON) CR tablet 10 mEq (not administered)  sodium bicarbonate tablet 1,300 mg (not administered)  GENTEAL TEARS 0.1-0.2-0.3 % SOLN 1 drop (not administered)    escitalopram (LEXAPRO) tablet 15 mg (not administered)  calcitRIOL (ROCALTROL) capsule 0.25 mcg (not administered)  cinacalcet (SENSIPAR) tablet 30 mg (not administered)  zolpidem (AMBIEN) tablet 5 mg (not administered)  pantoprazole (PROTONIX) injection 40 mg (40 mg Intravenous Given 11/12/16 2044)     Initial Impression / Assessment and Plan / ED Course  I have reviewed the triage vital signs and the nursing notes.  Pertinent labs & imaging results that were available during my care of the patient were reviewed by me and considered in my medical decision making (see chart for details).     Patient has symptomatic anemia with melena. Hemoglobin 7.1, thus will hold on acute transfusion at this time as she is not critically ill otherwise.ood pressure adequate. Discussed with hospitalist, Dr. Myna Hidalgo, who will admit. Consulted gastroenterology at his request. Dr. Penelope Coop has been made aware of patient and GI will see tomorrow. IV protonix.   Final Clinical Impressions(s) / ED Diagnoses   Final diagnoses:  Melena  Symptomatic anemia    New Prescriptions New Prescriptions   No medications on file     Sherwood Gambler, MD 11/12/16 2106

## 2016-11-13 ENCOUNTER — Inpatient Hospital Stay (HOSPITAL_COMMUNITY): Payer: Medicare Other | Admitting: Certified Registered"

## 2016-11-13 ENCOUNTER — Encounter (HOSPITAL_COMMUNITY)
Admission: EM | Disposition: A | Discharge status: Home / Self Care | Payer: Self-pay | Source: Home / Self Care | Attending: Emergency Medicine

## 2016-11-13 ENCOUNTER — Encounter (HOSPITAL_COMMUNITY): Payer: Self-pay | Admitting: Certified Registered"

## 2016-11-13 DIAGNOSIS — N184 Chronic kidney disease, stage 4 (severe): Secondary | ICD-10-CM

## 2016-11-13 DIAGNOSIS — R42 Dizziness and giddiness: Secondary | ICD-10-CM | POA: Diagnosis not present

## 2016-11-13 DIAGNOSIS — E1122 Type 2 diabetes mellitus with diabetic chronic kidney disease: Secondary | ICD-10-CM | POA: Diagnosis not present

## 2016-11-13 DIAGNOSIS — I129 Hypertensive chronic kidney disease with stage 1 through stage 4 chronic kidney disease, or unspecified chronic kidney disease: Secondary | ICD-10-CM | POA: Diagnosis not present

## 2016-11-13 DIAGNOSIS — K921 Melena: Principal | ICD-10-CM

## 2016-11-13 DIAGNOSIS — Z8673 Personal history of transient ischemic attack (TIA), and cerebral infarction without residual deficits: Secondary | ICD-10-CM

## 2016-11-13 DIAGNOSIS — K922 Gastrointestinal hemorrhage, unspecified: Secondary | ICD-10-CM | POA: Diagnosis not present

## 2016-11-13 DIAGNOSIS — D649 Anemia, unspecified: Secondary | ICD-10-CM

## 2016-11-13 DIAGNOSIS — E876 Hypokalemia: Secondary | ICD-10-CM

## 2016-11-13 HISTORY — PX: ESOPHAGOGASTRODUODENOSCOPY (EGD) WITH PROPOFOL: SHX5813

## 2016-11-13 LAB — BASIC METABOLIC PANEL
ANION GAP: 8 (ref 5–15)
BUN: 48 mg/dL — ABNORMAL HIGH (ref 6–20)
CO2: 21 mmol/L — ABNORMAL LOW (ref 22–32)
Calcium: 8.4 mg/dL — ABNORMAL LOW (ref 8.9–10.3)
Chloride: 113 mmol/L — ABNORMAL HIGH (ref 101–111)
Creatinine, Ser: 2.9 mg/dL — ABNORMAL HIGH (ref 0.44–1.00)
GFR calc Af Amer: 17 mL/min — ABNORMAL LOW (ref >60)
GFR, EST NON AFRICAN AMERICAN: 14 mL/min — AB (ref >60)
GLUCOSE: 89 mg/dL (ref 65–99)
POTASSIUM: 3.2 mmol/L — AB (ref 3.5–5.1)
Sodium: 142 mmol/L (ref 135–145)

## 2016-11-13 LAB — OCCULT BLOOD, POC DEVICE: Fecal Occult Bld: POSITIVE — AB

## 2016-11-13 LAB — GLUCOSE, CAPILLARY
GLUCOSE-CAPILLARY: 104 mg/dL — AB (ref 65–99)
GLUCOSE-CAPILLARY: 175 mg/dL — AB (ref 65–99)
GLUCOSE-CAPILLARY: 83 mg/dL (ref 65–99)
Glucose-Capillary: 79 mg/dL (ref 65–99)
Glucose-Capillary: 95 mg/dL (ref 65–99)
Glucose-Capillary: 96 mg/dL (ref 65–99)

## 2016-11-13 LAB — MRSA PCR SCREENING: MRSA by PCR: NEGATIVE

## 2016-11-13 LAB — CBC
HCT: 22.6 % — ABNORMAL LOW (ref 36.0–46.0)
Hemoglobin: 7.5 g/dL — ABNORMAL LOW (ref 12.0–15.0)
MCH: 29.6 pg (ref 26.0–34.0)
MCHC: 33.2 g/dL (ref 30.0–36.0)
MCV: 89.3 fL (ref 78.0–100.0)
PLATELETS: 207 10*3/uL (ref 150–400)
RBC: 2.53 MIL/uL — AB (ref 3.87–5.11)
RDW: 15.9 % — ABNORMAL HIGH (ref 11.5–15.5)
WBC: 7.3 10*3/uL (ref 4.0–10.5)

## 2016-11-13 SURGERY — ESOPHAGOGASTRODUODENOSCOPY (EGD) WITH PROPOFOL
Anesthesia: Monitor Anesthesia Care

## 2016-11-13 MED ORDER — PROPOFOL 500 MG/50ML IV EMUL
INTRAVENOUS | Status: DC | PRN
  Administered 2016-11-13: 75 ug/kg/min via INTRAVENOUS

## 2016-11-13 MED ORDER — LIDOCAINE 2% (20 MG/ML) 5 ML SYRINGE
INTRAMUSCULAR | Status: DC | PRN
  Administered 2016-11-13: 30 mg via INTRAVENOUS

## 2016-11-13 MED ORDER — PROPOFOL 10 MG/ML IV BOLUS
INTRAVENOUS | Status: DC | PRN
  Administered 2016-11-13: 20 mg via INTRAVENOUS

## 2016-11-13 MED ORDER — SODIUM CHLORIDE 0.9 % IV SOLN
INTRAVENOUS | Status: DC
  Administered 2016-11-13: 500 mL via INTRAVENOUS

## 2016-11-13 MED ORDER — PANTOPRAZOLE SODIUM 40 MG IV SOLR
40.0000 mg | Freq: Two times a day (BID) | INTRAVENOUS | Status: DC
  Administered 2016-11-13 – 2016-11-14 (×3): 40 mg via INTRAVENOUS
  Filled 2016-11-13 (×3): qty 40

## 2016-11-13 SURGICAL SUPPLY — 14 items

## 2016-11-13 NOTE — Plan of Care (Signed)
Problem: Activity: Goal: Risk for activity intolerance will decrease Outcome: Progressing Patient endorses dyspnea on exertion. Verbalizes understanding of need to use call light to call for assistance prior to ambulation

## 2016-11-13 NOTE — Care Management Obs Status (Signed)
[facility] NOTIFICATION   Patient Details  Name: Kim Burns MRN: [id number] Date of Birth: [birth year]   Medicare Observation Status Notification Given:  Yes    Bethena Roys, RN 11/13/2016, 4:16 PM

## 2016-11-13 NOTE — Consult Note (Signed)
Referring Provider:  Dr. Christiana Fuchs Primary Care Physician:  [name], MD Primary Gastroenterologist:  Dr. Paulita Fujita   Reason for Consultation:  GI bleed  HPI: Kim Burns is a 81 y.o. female with past medical history significant for Whipple's procedure for large pancreatic head cystic mass, pathology showed IPMN with high-grade dysplasia, history of chronic kidney disease, history of previous stroke on Plavix came in to  ED yesterday with chief complaint of dark stools.GI is consulted for further evaluation.  Patient seen and examined at bedside. According to patient, see has been feeling dizziness. 2-3 weeks. Initially it was thought to from valacyclovir which was recently started for shingles. Subsequently patient started noticing black color stool with traces of red blood since Monday. She had lower abdominal cramps but she denied any vomiting. Complaining of occasional nausea. She denied any NSAID use. No bowel movement today. Last dose of Plavix was yesterday.  Family history of colon cancer in father in his 47s. One of her sister unfortunately had a perforation during colonoscopy and because of the fear of complications  patient has declined colonoscopy for screening in the past.   Past Medical History:  Diagnosis Date  . Anemia of chronic disease 1985   hysterectomy fibroid tumors  . Blood transfusion without reported diagnosis   . Bradycardia   . CKD (chronic kidney disease) stage 3, GFR 30-59 ml/min    sees dr Arty Baumgartner sees next in jan 2018  . Dysrhythmia 04/16/2016   bradycardia due to medication   . GERD (gastroesophageal reflux disease)   . Gout   . Headache   . History of kidney stones 06/2013  . Hyperparathyroidism ([place])   . Hypertension   . Itching    last year  . PONV (postoperative nausea and vomiting)   . Sleep apnea    no cpap machine. could not tolerate  . Stroke ([place]) 04/16/2016   TIA 1995  . Thyroid disease   . TIA (transient ischemic attack)  1996   x 1  . Vitamin D deficiency     Past Surgical History:  Procedure Laterality Date  . ABDOMINAL HYSTERECTOMY     complete  . APPENDECTOMY     with gallbladder  . BACK SURGERY     lower  . BREAST LUMPECTOMY Left x 2   many years apart, benign  . CHOLECYSTECTOMY    . CYSTOSCOPY WITH URETEROSCOPY AND STENT PLACEMENT Left 06/18/2013   Procedure: CYSTOSCOPY WITH Lef URETEROSCOPY AND Left STENT PLACEMENT;  Surgeon: Dutch Gray, MD;  Location: [facility];  Service: Urology;  Laterality: Left;  . EUS N/A 02/07/2016   Procedure: ESOPHAGEAL ENDOSCOPIC ULTRASOUND (EUS) RADIAL;  Surgeon: Arta Silence, MD;  Location: [facility];  Service: Endoscopy;  Laterality: N/A;  . EYE SURGERY Bilateral 2014   ioc for catracts   . PARATHYROID EXPLORATION    . WHIPPLE PROCEDURE N/A 04/23/2016   Procedure: WHIPPLE PROCEDURE;  Surgeon: Stark Klein, MD;  Location: [facility];  Service: General;  Laterality: N/A;    Prior to Admission medications   Medication Sig Start Date End Date Taking? Authorizing Provider  acetaminophen (TYLENOL) 325 MG tablet Take 325 mg by mouth every 6 (six) hours as needed for moderate pain.    Yes [provider]  Amino Acids-Protein Hydrolys (FEEDING SUPPLEMENT, PRO-STAT SUGAR FREE 64,) LIQD Take 30 mLs by mouth 3 (three) times daily. Patient taking differently: Take 30 mLs by mouth daily as needed (for nutrient).  05/06/16  Yes Stark Klein,  MD  Artificial Tear Solution (GENTEAL TEARS OP) Place 1 drop into the right eye 2 (two) times daily as needed (dry eyes).    Yes [provider]  azelastine (ASTELIN) 0.1 % nasal spray Place 1 spray into both nostrils at bedtime as needed for rhinitis. Use in each nostril as directed   Yes [provider]  brimonidine-timolol (COMBIGAN) 0.2-0.5 % ophthalmic solution Place 1 drop into the left eye every 12 (twelve) hours.   Yes [provider]  calcitRIOL (ROCALTROL) 0.25 MCG capsule Take 0.25 mcg by mouth every  Monday, Wednesday, and Friday. At night   Yes [provider]  cinacalcet (SENSIPAR) 30 MG tablet Take 30 mg by mouth daily with supper.   Yes [provider]  clopidogrel (PLAVIX) 75 MG tablet Take 75 mg by mouth every morning.   Yes [provider]  diclofenac sodium (VOLTAREN) 1 % GEL Apply 2 g topically 4 (four) times daily as needed (for hand pain).    Yes [provider]  diphenhydramine-acetaminophen (TYLENOL PM) 25-500 MG TABS Take 2 tablets by mouth at bedtime as needed (for sleep).    Yes [provider]  escitalopram (LEXAPRO) 5 MG tablet Take 15 mg by mouth at bedtime.    Yes [provider]  fenofibrate (TRICOR) 145 MG tablet Take 72.5 mg by mouth every morning.    Yes [provider]  insulin lispro (HUMALOG) 100 UNIT/ML injection Inject into the skin 3 (three) times daily before meals. Sliding scale before meals three times a day  150-200 Give 2 units 200-250 give 3 units 250-300 give 4 units   Yes [provider]  Pancrelipase, Lip-Prot-Amyl, (CREON PO) Take 1-2 capsules by mouth 3 (three) times daily as needed (for loose stool). Take 2 caps before meals; take 1 caps before snack.   Yes [provider]  potassium chloride (K-DUR,KLOR-CON) 10 MEQ tablet Take 1 tablet (10 mEq total) by mouth daily. 10/08/16  Yes Everrett Coombe, MD  prednisoLONE acetate (PRED FORTE) 1 % ophthalmic suspension Place 1 drop into the left eye 4 (four) times daily.   Yes [provider]  sodium bicarbonate 650 MG tablet Take 2 tablets (1,300 mg total) by mouth 2 (two) times daily. 10/07/16  Yes Carlyle Dolly, MD  valACYclovir (VALTREX) 500 MG tablet Take 500 mg by mouth daily.   Yes [provider]  Alum & Mag Hydroxide-Simeth (GI COCKTAIL) SUSP suspension Take 30 mLs by mouth 3 (three) times daily as needed for indigestion. Shake well. Patient not taking: Reported on 11/12/2016 05/03/16   Stark Klein, MD   bisacodyl (DULCOLAX) 10 MG suppository Place 1 suppository (10 mg total) rectally daily. Patient not taking: Reported on 11/12/2016 05/04/16   Stark Klein, MD  ondansetron (ZOFRAN-ODT) 4 MG disintegrating tablet Take 1 tablet (4 mg total) by mouth every 6 (six) hours as needed for nausea. Patient not taking: Reported on 10/03/2016 05/03/16   Stark Klein, MD  polyethylene glycol [facility] / Floria Raveling) packet Take 17 g by mouth daily. Patient not taking: Reported on 10/03/2016 05/04/16   Stark Klein, MD    Scheduled Meds: . brimonidine  1 drop Left Eye Q12H   And  . timolol  1 drop Left Eye Q12H  . calcitRIOL  0.25 mcg Oral Q M,W,F-2000  . cinacalcet  30 mg Oral Q supper  . escitalopram  15 mg Oral QHS  . insulin aspart  0-9 Units Subcutaneous Q4H  . [START ON 11/16/2016] pantoprazole  40 mg Intravenous Q12H  . potassium chloride  20 mEq Oral BID  . prednisoLONE acetate  1 drop Left Eye QID  . sodium bicarbonate  1,300 mg Oral BID  . sodium chloride flush  3 mL Intravenous Q12H  . valACYclovir  500 mg Oral Daily   Continuous Infusions: . pantoprozole (PROTONIX) infusion 8 mg/hr (11/13/16 0326)   PRN Meds:.acetaminophen **OR** acetaminophen, fentaNYL (SUBLIMAZE) injection, ondansetron **OR** ondansetron (ZOFRAN) IV, polyvinyl alcohol, zolpidem  Allergies as of 11/12/2016 - Review Complete 11/12/2016  Allergen Reaction Noted  . Codeine Other (See Comments) 07/28/2011  . Doxycycline Other (See Comments) 01/25/2016  . Gemfibrozil Other (See Comments) 01/18/2013  . Hydrocodone-acetaminophen Other (See Comments) 01/25/2016  . Lotemax [loteprednol etabonate]  01/25/2016  . Statins Other (See Comments) 07/28/2011    Family History  Problem Relation Age of Onset  . Heart disease Mother   . Stroke Mother   . Cancer Father        colon  . Alzheimer's disease Sister   . Cancer Brother        brain  . Alzheimer's disease Sister     Social History   Social History  . Marital  status: Widowed    Spouse name: N/A  . Number of children: N/A  . Years of education: N/A   Occupational History  . Not on file.   Social History Main Topics  . Smoking status: Never Smoker  . Smokeless tobacco: Never Used  . Alcohol use No  . Drug use: No  . Sexual activity: Not on file   Other Topics Concern  . Not on file   Social History Narrative  . No narrative on file    Review of Systems: Review of Systems  Constitutional: Negative for chills, fever and weight loss.  HENT: Negative for hearing loss and tinnitus.   Eyes: Negative for blurred vision and double vision.  Respiratory: Negative for cough and hemoptysis.   Cardiovascular: Negative for chest pain and palpitations.  Gastrointestinal: Positive for blood in stool, melena and nausea. Negative for abdominal pain, heartburn and vomiting.  Genitourinary: Negative for dysuria and urgency.  Musculoskeletal: Negative for myalgias and neck pain.  Skin: Negative for itching and rash.  Neurological: Positive for dizziness and weakness. Negative for seizures and loss of consciousness.  Endo/Heme/Allergies: Does not bruise/bleed easily.  Psychiatric/Behavioral: Negative for hallucinations and suicidal ideas.    Physical Exam: Vital signs: Vitals:   11/13/16 0251 11/13/16 0700  BP: (!) 123/59   Pulse: 73   Resp: 18 20  Temp: 98.9 F (37.2 C) 98.2 F (36.8 C)  SpO2: 100%    Last BM Date: 11/12/16 Physical Exam  Constitutional: She is oriented to person, place, and time. She appears well-developed and well-nourished. No distress.  HENT:  Head: Normocephalic and atraumatic.  Mouth/Throat: Oropharynx is clear and moist. No oropharyngeal exudate.  Eyes: EOM are normal. No scleral icterus.  Neck: Normal range of motion. Neck supple. No thyromegaly present.  Cardiovascular: Normal rate, regular rhythm and normal heart sounds.   Pulmonary/Chest: Effort normal and breath sounds normal. No respiratory distress.   Abdominal: Soft. Bowel sounds are normal. She exhibits no distension. There is no tenderness. There is no guarding.  Musculoskeletal: Normal range of motion. She exhibits no edema.  Neurological: She is alert and oriented to person, place, and time.  Skin: Skin is warm. No erythema.  Psychiatric: She has a normal mood and affect. Her behavior is normal. Thought content normal.  Vitals  reviewed.   GI:  Lab Results:  Recent Labs  11/12/16 1650 11/13/16 0508  WBC 9.2 7.3  HGB 7.1* 7.5*  HCT 21.9* 22.6*  PLT 283 207   BMET  Recent Labs  11/12/16 1650 11/13/16 0508  NA 137 142  K 3.2* 3.2*  CL 111 113*  CO2 19* 21*  GLUCOSE 182* 89  BUN 50* 48*  CREATININE 2.83* 2.90*  CALCIUM 8.7* 8.4*   LFT  Recent Labs  11/12/16 1650  PROT 5.4*  ALBUMIN 3.0*  AST 20  ALT 17  ALKPHOS 27*  BILITOT 0.5   PT/INR  Recent Labs  11/12/16 1650  LABPROT 14.2  INR 1.11     Studies/Results: Dg Chest 2 View  Result Date: 11/12/2016 CLINICAL DATA:  Dyspnea EXAM: CHEST  2 VIEW COMPARISON:  10/03/2016 chest radiograph. FINDINGS: Stable cardiomediastinal silhouette with normal heart size and aortic atherosclerosis. No pneumothorax. No pleural effusion. Hyperinflated lungs. No pulmonary edema. No acute consolidative airspace disease. Mild eventrations of the hemidiaphragms bilaterally. Stable surgical clip in the right upper quadrant. Stable curvilinear catheter fragment in the upper abdomen. IMPRESSION: Hyperinflated lungs, which may indicate obstructive lung disease. Otherwise no active disease in the chest. Electronically Signed   By: Ilona Sorrel M.D.   On: 11/12/2016 17:14    Impression/Plan: - GI bleed with melena and trace of red blood in the stool - acute blood loss anemia. Hemoglobin 7.1 on admission. Hemoglobin improved to 7.5 after 1 unit of blood transfusion. Patient has chronic anemia dated back to March 2018 and was receiving IV iron as an outpatient. -  Whipple's  procedure for large pancreatic head cystic mass, pathology showed IPMN with high-grade dysplasia - 04/2016  - chronic kidney disease - History of stroke. On Plavix. Last dose yesterday.  Recommendations ------------------------ - Patient only had minimal improvement in hemoglobin after 1 unit of blood transfusion.proceed with EGD today. Risks benefits and alternatives discussed with the patient. Patient is at high-risk of bleeding because of recent use of Plavix. Patient verbalized understanding. - She may need colonoscopy, if EGD is unremarkable. Family history of colon cancer in father in his 24s. One of her sister unfortunately had a perforation during colonoscopy and because of the fear of complications  patient has declined colonoscopy for screening in the past. - monitor H&H. GI will follow.    LOS: 1 day   Otis Brace  MD, FACP 11/13/2016, 8:45 AM  Pager [phone] If no answer or after 5 PM call [phone]

## 2016-11-13 NOTE — Progress Notes (Signed)
Patient ID: Kim Burns, female   DOB: [birth year], 81 y.o.   MRN: [id number]  PROGRESS NOTE    Kim Burns  JME:[id number] DOB: Aug 24, 1935 DOA: 11/12/2016 PCP: [name], MD   Brief Narrative:  81 year old female with history of anemia of chronic disease, chronic kidney disease stage IV, stroke, recent zoster involving her face and left eye followed by ophthalmology presented with near syncope along with melena and some bright red blood per rectum. She was found to have hemoglobin of 7.1. She was started on Protonix drip and GI was consulted.   Assessment & Plan:   Principal Problem:   Acute GI bleeding Active Problems:   CKD (chronic kidney disease) stage 3, GFR 30-59 ml/min   History of stroke   Hypokalemia   Melena   Symptomatic anemia   1. Acute GI bleed causing symptomatic anemia    - Most likely upper GI bleed  - Status post 1 unit packed red cell transfusion since admission - Hemoglobin 7.5 today - Status post upper GI endoscopy today which showed clean-based anastomotic ulcer. - Continue Protonix IV twice a day. Full liquid diet. Monitor H&H. Follow further recommendations from GI - Hold Plavix  2. CKD stage IV - Creatinine 2.9 today. Repeat a.m. labs. Outpatient follow-up with nephrology  3. History of CVA  - No deficits identified  - Hold Plavix    4. Hypokalemia  - Replace. Repeat a.m. labs    5. Insulin-dependent DM - A1c was 5.4% in March 2018  - Continue SSI    DVT prophylaxis: SCDs Code Status:  Full Family Communication: None at bedside Disposition Plan: Home in 1-2 days once cleared by GI  Consultants: GI  Procedures: Upper GI endoscopy on 11/12/2016 : Impression:               - Z-line regular, 36 cm from the incisors.                           - Evidence of previous gastric surgery was found,                            characterized by ulceration.                           - Gastritis. Biopsied.  Antimicrobials:  None  Subjective: Patient seen and examined at bedside. She denies current abdominal pain, nausea or vomiting. She had one black all movement this morning.  Objective: Vitals:   11/13/16 1110 11/13/16 1120 11/13/16 1130 11/13/16 1140  BP: 119/61 140/67 (!) 160/70 (!) 167/55  Pulse: 71 64 61 61  Resp: 20 18 16 11   Temp:      TempSrc:      SpO2: 100% 100% 100% 96%  Weight:      Height:        Intake/Output Summary (Last 24 hours) at 11/13/16 1435 Last data filed at 11/13/16 1100  Gross per 24 hour  Intake           317.08 ml  Output              500 ml  Net          -182.92 ml   Filed Weights   11/12/16 2228 11/13/16 0001 11/13/16 1026  Weight: 56.1 kg (123 lb 11.2 oz) 56.1 kg (123 lb 11.2 oz)  55.8 kg (123 lb)    Examination:  General exam: Appears calm and comfortable  Respiratory system: Bilateral decreased breath sound at bases Cardiovascular system: S1 & S2 heard,Rate controlled.  Gastrointestinal system: Abdomen is nondistended, soft and nontender. Normal bowel sounds heard. Extremities: No cyanosis, clubbing, edema    Data Reviewed: I have personally reviewed following labs and imaging studies  CBC:  Recent Labs Lab 11/12/16 1650 11/13/16 0508  WBC 9.2 7.3  HGB 7.1* 7.5*  HCT 21.9* 22.6*  MCV 91.6 89.3  PLT 283 101   Basic Metabolic Panel:  Recent Labs Lab 11/12/16 1650 11/13/16 0508  NA 137 142  K 3.2* 3.2*  CL 111 113*  CO2 19* 21*  GLUCOSE 182* 89  BUN 50* 48*  CREATININE 2.83* 2.90*  CALCIUM 8.7* 8.4*   GFR: Estimated Creatinine Clearance: 13.6 mL/min (A) (by C-G formula based on SCr of 2.9 mg/dL (H)). Liver Function Tests:  Recent Labs Lab 11/12/16 1650  AST 20  ALT 17  ALKPHOS 27*  BILITOT 0.5  PROT 5.4*  ALBUMIN 3.0*   No results for input(s): LIPASE, AMYLASE in the last 168 hours. No results for input(s): AMMONIA in the last 168 hours. Coagulation Profile:  Recent Labs Lab 11/12/16 1650  INR 1.11   Cardiac  Enzymes: No results for input(s): CKTOTAL, CKMB, CKMBINDEX, TROPONINI in the last 168 hours. BNP (last 3 results) No results for input(s): PROBNP in the last 8760 hours. HbA1C: No results for input(s): HGBA1C in the last 72 hours. CBG:  Recent Labs Lab 11/13/16 0031 11/13/16 0418 11/13/16 0739 11/13/16 1341  GLUCAP 96 95 79 104*   Lipid Profile: No results for input(s): CHOL, HDL, LDLCALC, TRIG, CHOLHDL, LDLDIRECT in the last 72 hours. Thyroid Function Tests: No results for input(s): TSH, T4TOTAL, FREET4, T3FREE, THYROIDAB in the last 72 hours. Anemia Panel: No results for input(s): VITAMINB12, FOLATE, FERRITIN, TIBC, IRON, RETICCTPCT in the last 72 hours. Sepsis Labs: No results for input(s): PROCALCITON, LATICACIDVEN in the last 168 hours.  Recent Results (from the past 240 hour(s))  MRSA PCR Screening     Status: None   Collection Time: 11/12/16 10:34 PM  Result Value Ref Range Status   MRSA by PCR NEGATIVE NEGATIVE Final    Comment:        The GeneXpert MRSA Assay (FDA approved for NASAL specimens only), is one component of a comprehensive MRSA colonization surveillance program. It is not intended to diagnose MRSA infection nor to guide or monitor treatment for MRSA infections.          Radiology Studies: Dg Chest 2 View  Result Date: 11/12/2016 CLINICAL DATA:  Dyspnea EXAM: CHEST  2 VIEW COMPARISON:  10/03/2016 chest radiograph. FINDINGS: Stable cardiomediastinal silhouette with normal heart size and aortic atherosclerosis. No pneumothorax. No pleural effusion. Hyperinflated lungs. No pulmonary edema. No acute consolidative airspace disease. Mild eventrations of the hemidiaphragms bilaterally. Stable surgical clip in the right upper quadrant. Stable curvilinear catheter fragment in the upper abdomen. IMPRESSION: Hyperinflated lungs, which may indicate obstructive lung disease. Otherwise no active disease in the chest. Electronically Signed   By: Ilona Sorrel  M.D.   On: 11/12/2016 17:14        Scheduled Meds: . brimonidine  1 drop Left Eye Q12H   And  . timolol  1 drop Left Eye Q12H  . calcitRIOL  0.25 mcg Oral Q M,W,F-2000  . cinacalcet  30 mg Oral Q supper  . escitalopram  15 mg Oral QHS  .  insulin aspart  0-9 Units Subcutaneous Q4H  . pantoprazole  40 mg Intravenous Q12H  . potassium chloride  20 mEq Oral BID  . prednisoLONE acetate  1 drop Left Eye QID  . sodium bicarbonate  1,300 mg Oral BID  . sodium chloride flush  3 mL Intravenous Q12H  . valACYclovir  500 mg Oral Daily   Continuous Infusions:   LOS: 1 day        Aline August, MD [facility] Pager [phone]  If 7PM-7AM, please contact night-coverage [email or web address] Password [email or web address] 11/13/2016, 2:35 PM

## 2016-11-13 NOTE — Anesthesia Postprocedure Evaluation (Signed)
Anesthesia Post Note  Patient: Kim Burns  Procedure(s) Performed: Procedure(s) (LRB): ESOPHAGOGASTRODUODENOSCOPY (EGD) WITH PROPOFOL (N/A)     Patient location during evaluation: PACU Anesthesia Type: MAC Level of consciousness: awake and alert Pain management: pain level controlled Vital Signs Assessment: post-procedure vital signs reviewed and stable Respiratory status: spontaneous breathing, nonlabored ventilation, respiratory function stable and patient connected to nasal cannula oxygen Cardiovascular status: stable and blood pressure returned to baseline Postop Assessment: no apparent nausea or vomiting Anesthetic complications: no    Last Vitals:  Vitals:   11/13/16 1130 11/13/16 1140  BP: (!) 160/70 (!) 167/55  Pulse: 61 61  Resp: 16 11  Temp:    SpO2: 100% 96%    Last Pain:  Vitals:   11/13/16 1108  TempSrc: Oral                 Tiajuana Amass

## 2016-11-13 NOTE — Anesthesia Preprocedure Evaluation (Addendum)
Anesthesia Evaluation  Patient identified by MRN, date of birth, ID band Patient awake    Reviewed: Allergy & Precautions, NPO status , Patient's Chart, lab work & pertinent test results  History of Anesthesia Complications (+) PONV  Airway Mallampati: II  TM Distance: >3 FB Neck ROM: Full    Dental no notable dental hx. (+) Teeth Intact   Pulmonary neg pulmonary ROS, sleep apnea ,    Pulmonary exam normal breath sounds clear to auscultation       Cardiovascular hypertension, Pt. on medications negative cardio ROS Normal cardiovascular exam+ dysrhythmias  Rhythm:Regular Rate:Normal     Neuro/Psych  Headaches, TIACVA negative neurological ROS  negative psych ROS   GI/Hepatic negative GI ROS, Neg liver ROS, GERD  ,  Endo/Other  negative endocrine ROS  Renal/GU Renal InsufficiencyRenal diseasenegative Renal ROS  negative genitourinary   Musculoskeletal negative musculoskeletal ROS (+)   Abdominal   Peds negative pediatric ROS (+)  Hematology negative hematology ROS (+) anemia ,   Anesthesia Other Findings   Reproductive/Obstetrics negative OB ROS                             Anesthesia Physical  Anesthesia Plan  ASA: III  Anesthesia Plan: MAC   Post-op Pain Management:    Induction: Intravenous  PONV Risk Score and Plan: 3 and Ondansetron, Propofol infusion and Treatment may vary due to age or medical condition  Airway Management Planned: Natural Airway and Nasal Cannula  Additional Equipment:   Intra-op Plan:   Post-operative Plan:   Informed Consent: I have reviewed the patients History and Physical, chart, labs and discussed the procedure including the risks, benefits and alternatives for the proposed anesthesia with the patient or authorized representative who has indicated his/her understanding and acceptance.   Dental advisory given  Plan Discussed with: CRNA and  Surgeon  Anesthesia Plan Comments:        Anesthesia Quick Evaluation

## 2016-11-13 NOTE — Transfer of Care (Signed)
Immediate Anesthesia Transfer of Care Note  Patient: Kim Burns  Procedure(s) Performed: Procedure(s): ESOPHAGOGASTRODUODENOSCOPY (EGD) WITH PROPOFOL (N/A)  Patient Location: Endoscopy Unit  Anesthesia Type:MAC  Level of Consciousness: drowsy  Airway & Oxygen Therapy: Patient Spontanous Breathing and Patient connected to nasal cannula oxygen  Post-op Assessment: Report given to RN  Post vital signs: Reviewed and stable  Last Vitals:  Vitals:   11/13/16 0700 11/13/16 1026  BP:    Pulse:  63  Resp: 20 11  Temp: 36.8 C 36.6 C  SpO2:  100%    Last Pain:  Vitals:   11/13/16 1026  TempSrc: Oral         Complications: No apparent anesthesia complications

## 2016-11-13 NOTE — Care Management CC44 (Signed)
Condition Code 44 Documentation Completed  Patient Details  Name: Kim Burns MRN: [id number] Date of Birth: Feb 13, 1936   Condition Code 44 given:  Yes Patient signature on Condition Code 44 notice:  Yes Documentation of 2 MD's agreement:  Yes Code 44 added to claim:  Yes    Bethena Roys, RN 11/13/2016, 4:16 PM

## 2016-11-13 NOTE — Anesthesia Procedure Notes (Signed)
Procedure Name: MAC Date/Time: 11/13/2016 10:48 AM Performed by: Barrington Ellison Pre-anesthesia Checklist: Patient identified, Emergency Drugs available, Suction available and Patient being monitored Patient Re-evaluated:Patient Re-evaluated prior to induction Oxygen Delivery Method: Nasal cannula

## 2016-11-13 NOTE — Op Note (Signed)
[facility] Patient Name: Kim Burns Procedure Date : 11/13/2016 MRN: [id number] Attending MD: Otis Brace , MD Date of Birth: [birth year] CSN: [id number] Age: 81 Admit Type: Inpatient Procedure:                Upper GI endoscopy Indications:              Melena Providers:                Otis Brace, MD, Cleda Daub, RN, Tinnie Gens, Technician, Sampson Si, CRNA Referring MD:              Medicines:                Sedation Administered by an Anesthesia Professional Complications:            No immediate complications. Estimated Blood Loss:     Estimated blood loss was minimal. Procedure:                Pre-Anesthesia Assessment:                           - Prior to the procedure, a History and Physical                            was performed, and patient medications and                            allergies were reviewed. The patient's tolerance of                            previous anesthesia was also reviewed. The risks                            and benefits of the procedure and the sedation                            options and risks were discussed with the patient.                            All questions were answered, and informed consent                            was obtained. Prior Anticoagulants: The patient has                            taken Plavix (clopidogrel), last dose was 1 day                            prior to procedure. ASA Grade Assessment: III - A                            patient with severe systemic disease. After  reviewing the risks and benefits, the patient was                            deemed in satisfactory condition to undergo the                            procedure.                           After obtaining informed consent, the endoscope was                            passed under direct vision. Throughout the                            procedure, the patient's  blood pressure, pulse, and                            oxygen saturations were monitored continuously. The                            [id number]-[id number] ([id number]) scope was introduced through the                            mouth, and advanced to the afferent and efferent                            jejunal loops. The upper GI endoscopy was                            accomplished without difficulty. The patient                            tolerated the procedure well. Scope In: Scope Out: Findings:      The Z-line was regular and was found 36 cm from the incisors.      The exam of the esophagus was otherwise normal.      Evidence of previous surgery was found in the gastric antrum. This was       characterized by ulceration. patient was found to have clean base       ulceron at the efferent loop without any high-risk stigmata. Both       efferent loop and afferent loops were examined and appeared normal       without any evidence of bleeding.      Diffuse moderate inflammation characterized by congestion (edema),       erosions, erythema, friability and linear erosions was found in the       entire examined stomach. Biopsies were taken with a cold forceps for       histology. only one biopsy was taken because of oozing of blood after       taking biopsy which self resolved. Impression:               - Z-line regular, 36 cm from the incisors.                           -  Evidence of previous gastric surgery was found,                            characterized by ulceration.                           - Gastritis. Biopsied. Moderate Sedation:      Moderate (conscious) sedation was personally administered by an       anesthesia professional. The following parameters were monitored: oxygen       saturation, heart rate, blood pressure, and response to care. Recommendation:           - Return patient to hospital ward for ongoing care.                           - Full liquid diet.                           -  Continue present medications.                           - Await pathology results. Procedure Code(s):        --- Professional ---                           [phone], Esophagogastroduodenoscopy, flexible,                            transoral; with biopsy, single or multiple Diagnosis Code(s):        --- Professional ---                           Z98.0, Intestinal bypass and anastomosis status                           K29.70, Gastritis, unspecified, without bleeding                           K92.1, Melena (includes Hematochezia) CPT copyright 2016 American Medical Association. All rights reserved. The codes documented in this report are preliminary and upon coder review may  be revised to meet current compliance requirements. Otis Brace, MD Otis Brace, MD 11/13/2016 11:20:00 AM Number of Addenda: 0

## 2016-11-13 NOTE — Brief Op Note (Signed)
11/12/2016 - 11/13/2016  11:27 AM  PATIENT:  Kim Burns  81 y.o. female  PRE-OPERATIVE DIAGNOSIS:  Melena  POST-OPERATIVE DIAGNOSIS:  ulcer at anastamosis  PROCEDURE:  Procedure(s): ESOPHAGOGASTRODUODENOSCOPY (EGD) WITH PROPOFOL (N/A)  SURGEON:  Surgeon(s) and Role:    * Lasonia Casino, MD - Primary  Findings/recommendations --------------------------------------- - EGD showed clean based anastomotic ulcer without any high-risk stigmata - change Protonix drip to IV twice a day  - Advance diet to full liquid. - Monitor H&H. - patient is also seen small amount of red blood in the stool, has declined colonoscopy in the past. She is willing to go for inpatient colonoscopy only if there is evidence of active lower GI bleed. Patient's last dose of Plavix was yesterday. Continue to hold Plavix for now until further decision about colonoscopy is made. - GI will follow.   Otis Brace MD, [place] 11/13/2016, 11:28 AM  Pager [phone]  If no answer or after 5 PM call [phone]

## 2016-11-14 DIAGNOSIS — E1122 Type 2 diabetes mellitus with diabetic chronic kidney disease: Secondary | ICD-10-CM

## 2016-11-14 DIAGNOSIS — K921 Melena: Secondary | ICD-10-CM | POA: Diagnosis not present

## 2016-11-14 DIAGNOSIS — K922 Gastrointestinal hemorrhage, unspecified: Secondary | ICD-10-CM | POA: Diagnosis not present

## 2016-11-14 DIAGNOSIS — E876 Hypokalemia: Secondary | ICD-10-CM | POA: Diagnosis not present

## 2016-11-14 DIAGNOSIS — E119 Type 2 diabetes mellitus without complications: Secondary | ICD-10-CM

## 2016-11-14 LAB — CBC WITH DIFFERENTIAL/PLATELET
BASOS ABS: 0 10*3/uL (ref 0.0–0.1)
Basophils Relative: 0 %
Eosinophils Absolute: 0.1 10*3/uL (ref 0.0–0.7)
Eosinophils Relative: 2 %
HEMATOCRIT: 22.8 % — AB (ref 36.0–46.0)
Hemoglobin: 7.5 g/dL — ABNORMAL LOW (ref 12.0–15.0)
Lymphocytes Relative: 39 %
Lymphs Abs: 2.4 10*3/uL (ref 0.7–4.0)
MCH: 29.9 pg (ref 26.0–34.0)
MCHC: 32.9 g/dL (ref 30.0–36.0)
MCV: 90.8 fL (ref 78.0–100.0)
MONO ABS: 0.4 10*3/uL (ref 0.1–1.0)
Monocytes Relative: 6 %
Neutro Abs: 3.2 10*3/uL (ref 1.7–7.7)
Neutrophils Relative %: 53 %
Platelets: 183 10*3/uL (ref 150–400)
RBC: 2.51 MIL/uL — ABNORMAL LOW (ref 3.87–5.11)
RDW: 16.5 % — AB (ref 11.5–15.5)
WBC: 6.1 10*3/uL (ref 4.0–10.5)

## 2016-11-14 LAB — BASIC METABOLIC PANEL
ANION GAP: 7 (ref 5–15)
BUN: 40 mg/dL — ABNORMAL HIGH (ref 6–20)
CALCIUM: 8.2 mg/dL — AB (ref 8.9–10.3)
CO2: 20 mmol/L — AB (ref 22–32)
Chloride: 114 mmol/L — ABNORMAL HIGH (ref 101–111)
Creatinine, Ser: 2.73 mg/dL — ABNORMAL HIGH (ref 0.44–1.00)
GFR calc Af Amer: 18 mL/min — ABNORMAL LOW (ref >60)
GFR calc non Af Amer: 15 mL/min — ABNORMAL LOW (ref >60)
GLUCOSE: 90 mg/dL (ref 65–99)
POTASSIUM: 3.8 mmol/L (ref 3.5–5.1)
Sodium: 141 mmol/L (ref 135–145)

## 2016-11-14 LAB — MAGNESIUM: Magnesium: 1.5 mg/dL — ABNORMAL LOW (ref 1.7–2.4)

## 2016-11-14 LAB — GLUCOSE, CAPILLARY
GLUCOSE-CAPILLARY: 197 mg/dL — AB (ref 65–99)
Glucose-Capillary: 98 mg/dL (ref 65–99)

## 2016-11-14 MED ORDER — CLOPIDOGREL BISULFATE 75 MG PO TABS: 75.0000 mg | ORAL_TABLET | Freq: Every morning | ORAL | Status: DC

## 2016-11-14 MED ORDER — PANTOPRAZOLE SODIUM 40 MG PO TBEC: 40.0000 mg | DELAYED_RELEASE_TABLET | Freq: Two times a day (BID) | ORAL | 0 refills | Status: DC

## 2016-11-14 MED ORDER — FERROUS SULFATE 325 (65 FE) MG PO TBEC: 325.0000 mg | DELAYED_RELEASE_TABLET | Freq: Two times a day (BID) | ORAL | 0 refills | Status: DC

## 2016-11-14 MED ORDER — ONDANSETRON HCL 4 MG PO TABS: 4.0000 mg | ORAL_TABLET | Freq: Four times a day (QID) | ORAL | 0 refills | Status: DC | PRN

## 2016-11-14 MED ORDER — MAGNESIUM SULFATE 2 GM/50ML IV SOLN
2.0000 g | Freq: Once | INTRAVENOUS | Status: AC
  Administered 2016-11-14: 2 g via INTRAVENOUS
  Filled 2016-11-14: qty 50

## 2016-11-14 NOTE — Plan of Care (Signed)
Problem: Pain Managment: Goal: General experience of comfort will improve Outcome: Completed/Met Date Met: 11/14/16 Patient denies pain

## 2016-11-14 NOTE — Progress Notes (Addendum)
[facility] Progress Note  Kim Burns 81 y.o. 06-11-1935  CC:   GI bleed   Subjective: patient feeling better now. Denied abdominal pain, nausea or vomiting. Denied further black tarry stool. Denied further bright blood in the stool. She is having dark Brown stools now. Hemoglobin stable.  ROS : negative for chest pain shortness of breath   Objective: Vital signs in last 24 hours: Vitals:   11/14/16 0333 11/14/16 0700  BP: 137/67   Pulse: 64   Resp: 14 20  Temp: 97.8 F (36.6 C) 98.4 F (36.9 C)  SpO2: 97% 100%    Physical Exam:  General:  Alert, cooperative, no distress, appears stated age  Head:  Normocephalic, without obvious abnormality, atraumatic  Eyes:  , EOM's intact,   Lungs:   Clear to auscultation bilaterally, respirations unlabored  Heart:  Regular rate and rhythm, S1, S2 normal  Abdomen:   Soft, non-tender, bowel sounds active all four quadrants,  no masses,   Extremities: Extremities normal, atraumatic, no  edema       Lab Results:  Recent Labs  11/13/16 0508 11/14/16 0629  NA 142 141  K 3.2* 3.8  CL 113* 114*  CO2 21* 20*  GLUCOSE 89 90  BUN 48* 40*  CREATININE 2.90* 2.73*  CALCIUM 8.4* 8.2*  MG  --  1.5*    Recent Labs  11/12/16 1650  AST 20  ALT 17  ALKPHOS 27*  BILITOT 0.5  PROT 5.4*  ALBUMIN 3.0*    Recent Labs  11/13/16 0508 11/14/16 0629  WBC 7.3 6.1  NEUTROABS  --  3.2  HGB 7.5* 7.5*  HCT 22.6* 22.8*  MCV 89.3 90.8  PLT 207 183    Recent Labs  11/12/16 1650  LABPROT 14.2  INR 1.11      Assessment/Plan: - GI bleed with melena and trace of red blood in the stool. Resolved. EGD yesterday showed clean-based anastomotic ulcer - acute blood loss anemia. Hemoglobin stable -  Whipple's procedure for large pancreatic head cystic mass, pathology showed IPMN with high-grade dysplasia - 04/2016  - chronic kidney disease - History of stroke. On Plavix.   Recommendations ------------------------ -  no further bleeding episodes. Hemoglobin stable. - biopsy was negative for H. Pylori. - continue twice a day PPI for at least 8 weeks. Recommend over-the-counter iron supplements. - discussed the need for colonoscopy given trace of red blood that she had seen in the past as well as occult blood positive stool, she continues to decline. She prefers noninvasive method such as virtual colonoscopy which I think I be arranged as an outpatient. - okay to resume Plavix from GI standpoint. - GI will sign off. Call us back if needed. Follow-up in GI clinic with me in 4 weeks.   Otis Brace MD, [place] 11/14/2016, 3:10 PM  Pager [phone]  If no answer or after 5 PM call [phone]

## 2016-11-14 NOTE — Discharge Summary (Addendum)
Physician Discharge Summary  Kim Burns JSH:[id number] DOB: 07/14/1935 DOA: 11/12/2016  PCP: [name], MD  Admit date: 11/12/2016 Discharge date: 11/14/2016  Admitted From: Home Disposition:  Home  Recommendations for Outpatient Follow-up:  1. Follow up with PCP in 1 week with repeat CBC/BMP 2. Follow-up with gastroenterologist/Dr. Alessandra Bevels in 1-2 weeks 3. Restart Plavix on 11/15/2016 as per GI recommendations   Home Health: No  Equipment/Devices: None  Discharge Condition: Stable  CODE STATUS: Full  Diet recommendation: Heart Healthy / Carb Modified   Brief/Interim Summary: 81 year old female with history of anemia of chronic disease, chronic kidney disease stage IV, stroke, recent zoster involving her face and left eye followed by ophthalmology presented with near syncope along with melena and some bright red blood per rectum. She was found to have hemoglobin of 7.1. She was started on Protonix drip and GI was consulted. Patient had EGD done on 11/13/2016 and Protonix was switched to IV twice a day. Hemoglobin has remained stable. She has been cleared for discharge by GI.  Discharge Diagnoses:  Principal Problem:   Acute GI bleeding Active Problems:   CKD (chronic kidney disease) stage 3, GFR 30-59 ml/min   History of stroke   Hypokalemia   Melena   Symptomatic anemia   Diabetes mellitus type 2, controlled ([place])  1. Acute GI bleed causing symptomatic anemia  - Most likely upper GI bleed  - Status post 1 unit packed red cell transfusion since admission - Hemoglobin 7.5 today - Status post upper GI endoscopy today which showed clean-based anastomotic ulcer. - Patient feels much better and wants to go home. Switch IV Protonix to oral Protonix twice a day. GI has cleared the patient for discharge -Restart Plavix from 11/15/2016 as per GI recommendations. Follow up with GI as an outpatient in 1-2 weeks' time  2. CKD stage IV - Creatinine stable. Outpatient  follow-up with nephrology  3. History of CVA  - No deficits identified  - Restart Plavix on 11/15/2016  4. Hypokalemia  - Improved  5. Insulin-dependent DM - A1c was 5.4% in March 2018  -Outpatient follow-up  Discharge Instructions  Discharge Instructions    Call MD for:  difficulty breathing, headache or visual disturbances    Complete by:  As directed    Call MD for:  extreme fatigue    Complete by:  As directed    Call MD for:  hives    Complete by:  As directed    Call MD for:  persistant dizziness or light-headedness    Complete by:  As directed    Call MD for:  persistant nausea and vomiting    Complete by:  As directed    Call MD for:  severe uncontrolled pain    Complete by:  As directed    Call MD for:  temperature >100.4    Complete by:  As directed    Diet - low sodium heart healthy    Complete by:  As directed    Diet Carb Modified    Complete by:  As directed    Increase activity slowly    Complete by:  As directed      Allergies as of 11/14/2016      Reactions   Codeine Other (See Comments)   headache   Doxycycline Other (See Comments)   Pt didn't really remember about reaction.   Gemfibrozil Other (See Comments)   Myalgia    Hydrocodone-acetaminophen Other (See Comments)   Headache  Lotemax [loteprednol Etabonate]    burning   Statins Other (See Comments)   Muscle weakness      Medication List    STOP taking these medications   bisacodyl 10 MG suppository Commonly known as:  DULCOLAX   feeding supplement (PRO-STAT SUGAR FREE 64) Liqd   gi cocktail Susp suspension   ondansetron 4 MG disintegrating tablet Commonly known as:  ZOFRAN-ODT   polyethylene glycol packet Commonly known as:  MIRALAX / GLYCOLAX     TAKE these medications   acetaminophen 325 MG tablet Commonly known as:  TYLENOL Take 325 mg by mouth every 6 (six) hours as needed for moderate pain.   azelastine 0.1 % nasal spray Commonly known as:  ASTELIN Place 1  spray into both nostrils at bedtime as needed for rhinitis. Use in each nostril as directed   brimonidine-timolol 0.2-0.5 % ophthalmic solution Commonly known as:  COMBIGAN Place 1 drop into the left eye every 12 (twelve) hours.   calcitRIOL 0.25 MCG capsule Commonly known as:  ROCALTROL Take 0.25 mcg by mouth every Monday, Wednesday, and Friday. At night   cinacalcet 30 MG tablet Commonly known as:  SENSIPAR Take 30 mg by mouth daily with supper.   clopidogrel 75 MG tablet Commonly known as:  PLAVIX Take 1 tablet (75 mg total) by mouth every morning. Resume plavix from 11/15/16 What changed:  additional instructions   CREON PO Take 1-2 capsules by mouth 3 (three) times daily as needed (for loose stool). Take 2 caps before meals; take 1 caps before snack.   diclofenac sodium 1 % Gel Commonly known as:  VOLTAREN Apply 2 g topically 4 (four) times daily as needed (for hand pain).   diphenhydramine-acetaminophen 25-500 MG Tabs tablet Commonly known as:  TYLENOL PM Take 2 tablets by mouth at bedtime as needed (for sleep).   escitalopram 5 MG tablet Commonly known as:  LEXAPRO Take 15 mg by mouth at bedtime.   fenofibrate 145 MG tablet Commonly known as:  TRICOR Take 72.5 mg by mouth every morning.   ferrous sulfate 325 (65 FE) MG EC tablet Take 1 tablet (325 mg total) by mouth 2 (two) times daily.   GENTEAL TEARS OP Place 1 drop into the right eye 2 (two) times daily as needed (dry eyes).   insulin lispro 100 UNIT/ML injection Commonly known as:  HUMALOG Inject into the skin 3 (three) times daily before meals. Sliding scale before meals three times a day  150-200 Give 2 units 200-250 give 3 units 250-300 give 4 units   ondansetron 4 MG tablet Commonly known as:  ZOFRAN Take 1 tablet (4 mg total) by mouth every 6 (six) hours as needed for nausea.   pantoprazole 40 MG tablet Commonly known as:  PROTONIX Take 1 tablet (40 mg total) by mouth 2 (two) times daily before a  meal.   potassium chloride 10 MEQ tablet Commonly known as:  K-DUR,KLOR-CON Take 1 tablet (10 mEq total) by mouth daily.   prednisoLONE acetate 1 % ophthalmic suspension Commonly known as:  PRED FORTE Place 1 drop into the left eye 4 (four) times daily.   sodium bicarbonate 650 MG tablet Take 2 tablets (1,300 mg total) by mouth 2 (two) times daily.   valACYclovir 500 MG tablet Commonly known as:  VALTREX Take 500 mg by mouth daily.            Discharge Care Instructions        Start     Ordered  11/15/16 0000  clopidogrel (PLAVIX) 75 MG tablet   Every morning - 10a     11/14/16 1517   11/14/16 0000  ondansetron (ZOFRAN) 4 MG tablet  Every 6 hours PRN     11/14/16 1452   11/14/16 0000  pantoprazole (PROTONIX) 40 MG tablet  2 times daily before meals     11/14/16 1452   11/14/16 0000  Increase activity slowly     11/14/16 1452   11/14/16 0000  Diet - low sodium heart healthy     11/14/16 1452   11/14/16 0000  Diet Carb Modified     11/14/16 1452   11/14/16 0000  Call MD for:  temperature >100.4     11/14/16 1452   11/14/16 0000  Call MD for:  persistant nausea and vomiting     11/14/16 1452   11/14/16 0000  Call MD for:  severe uncontrolled pain     11/14/16 1452   11/14/16 0000  Call MD for:  difficulty breathing, headache or visual disturbances     11/14/16 1452   11/14/16 0000  Call MD for:  hives     11/14/16 1452   11/14/16 0000  Call MD for:  persistant dizziness or light-headedness     11/14/16 1452   11/14/16 0000  Call MD for:  extreme fatigue     11/14/16 1452   11/14/16 0000  ferrous sulfate 325 (65 FE) MG EC tablet  2 times daily     11/14/16 1538       Follow-up Information    [name], MD Follow up in 1 week(s).   Specialty:  Family Medicine Why:  with repeat CBC/CMP Contact information: [address] [place] [address] [phone]        Otis Brace, MD Follow up in 1 week(s).   Specialty:   Gastroenterology Contact information: [place] [address] [phone]          Allergies  Allergen Reactions  . Codeine Other (See Comments)    headache  . Doxycycline Other (See Comments)    Pt didn't really remember about reaction.  . Gemfibrozil Other (See Comments)    Myalgia   . Hydrocodone-Acetaminophen Other (See Comments)    Headache  . Lotemax [Loteprednol Etabonate]     burning  . Statins Other (See Comments)    Muscle weakness    Consultations:  GI   Procedures/Studies: Dg Chest 2 View  Result Date: 11/12/2016 CLINICAL DATA:  Dyspnea EXAM: CHEST  2 VIEW COMPARISON:  10/03/2016 chest radiograph. FINDINGS: Stable cardiomediastinal silhouette with normal heart size and aortic atherosclerosis. No pneumothorax. No pleural effusion. Hyperinflated lungs. No pulmonary edema. No acute consolidative airspace disease. Mild eventrations of the hemidiaphragms bilaterally. Stable surgical clip in the right upper quadrant. Stable curvilinear catheter fragment in the upper abdomen. IMPRESSION: Hyperinflated lungs, which may indicate obstructive lung disease. Otherwise no active disease in the chest. Electronically Signed   By: Ilona Sorrel M.D.   On: 11/12/2016 17:14  Upper GI endoscopy on 11/12/2016 : Impression: - Z-line regular, 36 cm from the incisors. - Evidence of previous gastric surgery was found,  characterized by ulceration. - Gastritis. Biopsied.   Subjective: Patient seen and examined at bedside. She feels better. No overnight fever, nausea or vomiting or bloody stools.  Discharge Exam: Vitals:   11/14/16 0333 11/14/16 0700  BP: 137/67   Pulse: 64   Resp: 14 20  Temp: 97.8  F (36.6 C) 98.4 F (36.9 C)  SpO2: 97% 100%   Vitals:   11/13/16 2112 11/14/16 0003 11/14/16 0333 11/14/16 0700  BP: 132/79 132/67 137/67   Pulse: 69 69 64    Resp: 12 14 14 20   Temp: 98 F (36.7 C) 97.9 F (36.6 C) 97.8 F (36.6 C) 98.4 F (36.9 C)  TempSrc: Oral Oral Oral Oral  SpO2: 98% 98% 97% 100%  Weight:   54.7 kg (120 lb 11.2 oz)   Height:        General: Pt is alert, awake, not in acute distress Cardiovascular: Rate controlled, S1/S2 + Respiratory: Bilateral decreased breath sounds at bases Abdominal: Soft, NT, ND, bowel sounds + Extremities: no edema, no cyanosis    The results of significant diagnostics from this hospitalization (including imaging, microbiology, ancillary and laboratory) are listed below for reference.     Microbiology: Recent Results (from the past 240 hour(s))  MRSA PCR Screening     Status: None   Collection Time: 11/12/16 10:34 PM  Result Value Ref Range Status   MRSA by PCR NEGATIVE NEGATIVE Final    Comment:        The GeneXpert MRSA Assay (FDA approved for NASAL specimens only), is one component of a comprehensive MRSA colonization surveillance program. It is not intended to diagnose MRSA infection nor to guide or monitor treatment for MRSA infections.      Labs: BNP (last 3 results) No results for input(s): BNP in the last 8760 hours. Basic Metabolic Panel:  Recent Labs Lab 11/12/16 1650 11/13/16 0508 11/14/16 0629  NA 137 142 141  K 3.2* 3.2* 3.8  CL 111 113* 114*  CO2 19* 21* 20*  GLUCOSE 182* 89 90  BUN 50* 48* 40*  CREATININE 2.83* 2.90* 2.73*  CALCIUM 8.7* 8.4* 8.2*  MG  --   --  1.5*   Liver Function Tests:  Recent Labs Lab 11/12/16 1650  AST 20  ALT 17  ALKPHOS 27*  BILITOT 0.5  PROT 5.4*  ALBUMIN 3.0*   No results for input(s): LIPASE, AMYLASE in the last 168 hours. No results for input(s): AMMONIA in the last 168 hours. CBC:  Recent Labs Lab 11/12/16 1650 11/13/16 0508 11/14/16 0629  WBC 9.2 7.3 6.1  NEUTROABS  --   --  3.2  HGB 7.1* 7.5* 7.5*  HCT 21.9* 22.6* 22.8*  MCV 91.6 89.3 90.8  PLT 283 207 183   Cardiac Enzymes: No results for  input(s): CKTOTAL, CKMB, CKMBINDEX, TROPONINI in the last 168 hours. BNP: Invalid input(s): POCBNP CBG:  Recent Labs Lab 11/13/16 1341 11/13/16 1545 11/13/16 2110 11/14/16 0829 11/14/16 1250  GLUCAP 104* 175* 83 98 197*   D-Dimer No results for input(s): DDIMER in the last 72 hours. Hgb A1c No results for input(s): HGBA1C in the last 72 hours. Lipid Profile No results for input(s): CHOL, HDL, LDLCALC, TRIG, CHOLHDL, LDLDIRECT in the last 72 hours. Thyroid function studies No results for input(s): TSH, T4TOTAL, T3FREE, THYROIDAB in the last 72 hours.  Invalid input(s): FREET3 Anemia work up No results for input(s): VITAMINB12, FOLATE, FERRITIN, TIBC, IRON, RETICCTPCT in the last 72 hours. Urinalysis    Component Value Date/Time   COLORURINE STRAW (A) 10/03/2016 [place] 10/03/2016 1755   LABSPEC 1.006 10/03/2016 1755   PHURINE 5.0 10/03/2016 1755   GLUCOSEU NEGATIVE 10/03/2016 1755   HGBUR SMALL (A) 10/03/2016 [place] 10/03/2016 [place] 10/03/2016 1755  PROTEINUR NEGATIVE 10/03/2016 1755   UROBILINOGEN 1.0 06/18/2013 1633   NITRITE NEGATIVE 10/03/2016 1755   LEUKOCYTESUR SMALL (A) 10/03/2016 1755   Sepsis Labs Invalid input(s): PROCALCITONIN,  WBC,  LACTICIDVEN Microbiology Recent Results (from the past 240 hour(s))  MRSA PCR Screening     Status: None   Collection Time: 11/12/16 10:34 PM  Result Value Ref Range Status   MRSA by PCR NEGATIVE NEGATIVE Final    Comment:        The GeneXpert MRSA Assay (FDA approved for NASAL specimens only), is one component of a comprehensive MRSA colonization surveillance program. It is not intended to diagnose MRSA infection nor to guide or monitor treatment for MRSA infections.      Time coordinating discharge: 35 minutes  SIGNED:   Aline August, MD  [facility] 11/14/2016, 3:06 PM Pager: [phone]  If 7PM-7AM, please contact  night-coverage [email or web address] Password [email or web address]

## 2016-11-15 LAB — BPAM RBC
BLOOD PRODUCT EXPIRATION DATE: 201810102359
Blood Product Expiration Date: 201810102359
ISSUE DATE / TIME: 201809252048
UNIT TYPE AND RH: 6200
Unit Type and Rh: 6200

## 2016-11-15 LAB — TYPE AND SCREEN
ABO/RH(D): A POS
Antibody Screen: NEGATIVE
UNIT DIVISION: 0
UNIT DIVISION: 0

## 2016-12-09 ENCOUNTER — Other Ambulatory Visit: Payer: Self-pay | Admitting: Gastroenterology

## 2016-12-09 DIAGNOSIS — Z8719 Personal history of other diseases of the digestive system: Secondary | ICD-10-CM

## 2016-12-20 ENCOUNTER — Other Ambulatory Visit: Payer: Self-pay

## 2016-12-20 DIAGNOSIS — Z01810 Encounter for preprocedural cardiovascular examination: Secondary | ICD-10-CM

## 2016-12-20 DIAGNOSIS — N185 Chronic kidney disease, stage 5: Secondary | ICD-10-CM

## 2017-01-03 ENCOUNTER — Other Ambulatory Visit (HOSPITAL_COMMUNITY): Payer: Self-pay | Admitting: *Deleted

## 2017-01-03 NOTE — Discharge Instructions (Signed)
Darbepoetin Alfa injection °What is this medicine? °DARBEPOETIN ALFA (dar be POE e tin AL fa) helps your body make more red blood cells. It is used to treat anemia caused by chronic kidney failure and chemotherapy. °This medicine may be used for other purposes; ask your health care provider or pharmacist if you have questions. °COMMON BRAND NAME(S): Aranesp °What should I tell my health care provider before I take this medicine? °They need to know if you have any of these conditions: °-blood clotting disorders or history of blood clots °-cancer patient not on chemotherapy °-cystic fibrosis °-heart disease, such as angina, heart failure, or a history of a heart attack °-hemoglobin level of 12 g/dL or greater °-high blood pressure °-low levels of folate, iron, or vitamin B12 °-seizures °-an unusual or allergic reaction to darbepoetin, erythropoietin, albumin, hamster proteins, latex, other medicines, foods, dyes, or preservatives °-pregnant or trying to get pregnant °-breast-feeding °How should I use this medicine? °This medicine is for injection into a vein or under the skin. It is usually given by a health care professional in a hospital or clinic setting. °If you get this medicine at home, you will be taught how to prepare and give this medicine. Use exactly as directed. Take your medicine at regular intervals. Do not take your medicine more often than directed. °It is important that you put your used needles and syringes in a special sharps container. Do not put them in a trash can. If you do not have a sharps container, call your pharmacist or healthcare provider to get one. °A special MedGuide will be given to you by the pharmacist with each prescription and refill. Be sure to read this information carefully each time. °Talk to your pediatrician regarding the use of this medicine in children. While this medicine may be used in children as young as 1 year for selected conditions, precautions do  apply. °Overdosage: If you think you have taken too much of this medicine contact a poison control center or emergency room at once. °NOTE: This medicine is only for you. Do not share this medicine with others. °What if I miss a dose? °If you miss a dose, take it as soon as you can. If it is almost time for your next dose, take only that dose. Do not take double or extra doses. °What may interact with this medicine? °Do not take this medicine with any of the following medications: °-epoetin alfa °This list may not describe all possible interactions. Give your health care provider a list of all the medicines, herbs, non-prescription drugs, or dietary supplements you use. Also tell them if you smoke, drink alcohol, or use illegal drugs. Some items may interact with your medicine. °What should I watch for while using this medicine? °Your condition will be monitored carefully while you are receiving this medicine. °You may need blood work done while you are taking this medicine. °What side effects may I notice from receiving this medicine? °Side effects that you should report to your doctor or health care professional as soon as possible: °-allergic reactions like skin rash, itching or hives, swelling of the face, lips, or tongue °-breathing problems °-changes in vision °-chest pain °-confusion, trouble speaking or understanding °-feeling faint or lightheaded, falls °-high blood pressure °-muscle aches or pains °-pain, swelling, warmth in the leg °-rapid weight gain °-severe headaches °-sudden numbness or weakness of the face, arm or leg °-trouble walking, dizziness, loss of balance or coordination °-seizures (convulsions) °-swelling of the ankles, feet, hands °-  unusually weak or tired °Side effects that usually do not require medical attention (report to your doctor or health care professional if they continue or are bothersome): °-diarrhea °-fever, chills (flu-like symptoms) °-headaches °-nausea, vomiting °-redness,  stinging, or swelling at site where injected °This list may not describe all possible side effects. Call your doctor for medical advice about side effects. You may report side effects to FDA at 1-800-FDA-1088. °Where should I keep my medicine? °Keep out of the reach of children. °Store in a refrigerator between 2 and 8 degrees C (36 and 46 degrees F). Do not freeze. Do not shake. Throw away any unused portion if using a single-dose vial. Throw away any unused medicine after the expiration date. °NOTE: This sheet is a summary. It may not cover all possible information. If you have questions about this medicine, talk to your doctor, pharmacist, or health care provider. °© 2018 Elsevier/Gold Standard (2015-09-25 19:52:26) °Ferumoxytol injection °What is this medicine? °FERUMOXYTOL is an iron complex. Iron is used to make healthy red blood cells, which carry oxygen and nutrients throughout the body. This medicine is used to treat iron deficiency anemia in people with chronic kidney disease. °This medicine may be used for other purposes; ask your health care provider or pharmacist if you have questions. °COMMON BRAND NAME(S): Feraheme °What should I tell my health care provider before I take this medicine? °They need to know if you have any of these conditions: °-anemia not caused by low iron levels °-high levels of iron in the blood °-magnetic resonance imaging (MRI) test scheduled °-an unusual or allergic reaction to iron, other medicines, foods, dyes, or preservatives °-pregnant or trying to get pregnant °-breast-feeding °How should I use this medicine? °This medicine is for injection into a vein. It is given by a health care professional in a hospital or clinic setting. °Talk to your pediatrician regarding the use of this medicine in children. Special care may be needed. °Overdosage: If you think you have taken too much of this medicine contact a poison control center or emergency room at once. °NOTE: This medicine  is only for you. Do not share this medicine with others. °What if I miss a dose? °It is important not to miss your dose. Call your doctor or health care professional if you are unable to keep an appointment. °What may interact with this medicine? °This medicine may interact with the following medications: °-other iron products °This list may not describe all possible interactions. Give your health care provider a list of all the medicines, herbs, non-prescription drugs, or dietary supplements you use. Also tell them if you smoke, drink alcohol, or use illegal drugs. Some items may interact with your medicine. °What should I watch for while using this medicine? °Visit your doctor or healthcare professional regularly. Tell your doctor or healthcare professional if your symptoms do not start to get better or if they get worse. You may need blood work done while you are taking this medicine. °You may need to follow a special diet. Talk to your doctor. Foods that contain iron include: whole grains/cereals, dried fruits, beans, or peas, leafy green vegetables, and organ meats (liver, kidney). °What side effects may I notice from receiving this medicine? °Side effects that you should report to your doctor or health care professional as soon as possible: °-allergic reactions like skin rash, itching or hives, swelling of the face, lips, or tongue °-breathing problems °-changes in blood pressure °-feeling faint or lightheaded, falls °-fever or chills °-flushing,   sweating, or hot feelings °-swelling of the ankles or feet °Side effects that usually do not require medical attention (report to your doctor or health care professional if they continue or are bothersome): °-diarrhea °-headache °-nausea, vomiting °-stomach pain °This list may not describe all possible side effects. Call your doctor for medical advice about side effects. You may report side effects to FDA at 1-800-FDA-1088. °Where should I keep my medicine? °This drug  is given in a hospital or clinic and will not be stored at home. °NOTE: This sheet is a summary. It may not cover all possible information. If you have questions about this medicine, talk to your doctor, pharmacist, or health care provider. °© 2018 Elsevier/Gold Standard (2015-03-09 12:41:49) ° °

## 2017-01-06 ENCOUNTER — Ambulatory Visit (HOSPITAL_COMMUNITY)
Admission: RE | Admit: 2017-01-06 | Discharge: 2017-01-06 | Disposition: A | Discharge status: Home / Self Care | Payer: Medicare Other | Source: Ambulatory Visit | Attending: Nephrology | Admitting: Nephrology

## 2017-01-06 VITALS — BP 177/86 | HR 56 | Temp 98.6°F | Resp 20 | Ht 64.5 in | Wt 125.0 lb

## 2017-01-06 DIAGNOSIS — N179 Acute kidney failure, unspecified: Secondary | ICD-10-CM

## 2017-01-06 DIAGNOSIS — D631 Anemia in chronic kidney disease: Secondary | ICD-10-CM | POA: Diagnosis present

## 2017-01-06 DIAGNOSIS — N189 Chronic kidney disease, unspecified: Secondary | ICD-10-CM | POA: Insufficient documentation

## 2017-01-06 LAB — POCT HEMOGLOBIN-HEMACUE: Hemoglobin: 9.9 g/dL — ABNORMAL LOW (ref 12.0–15.0)

## 2017-01-06 LAB — PHOSPHORUS: PHOSPHORUS: 3.7 mg/dL (ref 2.5–4.6)

## 2017-01-06 MED ORDER — DARBEPOETIN ALFA 40 MCG/0.4ML IJ SOSY
PREFILLED_SYRINGE | INTRAMUSCULAR | Status: AC
  Filled 2017-01-06: qty 0.4

## 2017-01-06 MED ORDER — SODIUM CHLORIDE 0.9 % IV SOLN
510.0000 mg | Freq: Once | INTRAVENOUS | Status: AC
  Administered 2017-01-06: 510 mg via INTRAVENOUS
  Filled 2017-01-06: qty 17

## 2017-01-06 MED ORDER — DARBEPOETIN ALFA 40 MCG/0.4ML IJ SOSY
40.0000 ug | PREFILLED_SYRINGE | INTRAMUSCULAR | Status: DC
  Administered 2017-01-06: 40 ug via SUBCUTANEOUS

## 2017-02-03 ENCOUNTER — Encounter (HOSPITAL_COMMUNITY): Payer: Medicare Other

## 2017-02-04 ENCOUNTER — Encounter (HOSPITAL_COMMUNITY)
Admission: RE | Admit: 2017-02-04 | Discharge: 2017-02-04 | Disposition: A | Discharge status: Home / Self Care | Payer: Medicare Other | Source: Ambulatory Visit | Attending: Nephrology | Admitting: Nephrology

## 2017-02-04 VITALS — BP 171/67 | HR 61 | Temp 97.7°F

## 2017-02-04 DIAGNOSIS — N179 Acute kidney failure, unspecified: Secondary | ICD-10-CM | POA: Diagnosis present

## 2017-02-04 LAB — COMPREHENSIVE METABOLIC PANEL
ALBUMIN: 3.2 g/dL — AB (ref 3.5–5.0)
ALK PHOS: 44 U/L (ref 38–126)
ALT: 16 U/L (ref 14–54)
AST: 19 U/L (ref 15–41)
Anion gap: 4 — ABNORMAL LOW (ref 5–15)
BUN: 28 mg/dL — ABNORMAL HIGH (ref 6–20)
CALCIUM: 8.4 mg/dL — AB (ref 8.9–10.3)
CO2: 24 mmol/L (ref 22–32)
CREATININE: 2.63 mg/dL — AB (ref 0.44–1.00)
Chloride: 111 mmol/L (ref 101–111)
GFR calc Af Amer: 19 mL/min — ABNORMAL LOW (ref >60)
GFR calc non Af Amer: 16 mL/min — ABNORMAL LOW (ref >60)
GLUCOSE: 89 mg/dL (ref 65–99)
Potassium: 3.8 mmol/L (ref 3.5–5.1)
SODIUM: 139 mmol/L (ref 135–145)
Total Bilirubin: 0.7 mg/dL (ref 0.3–1.2)
Total Protein: 5.8 g/dL — ABNORMAL LOW (ref 6.5–8.1)

## 2017-02-04 LAB — PHOSPHORUS: Phosphorus: 4.7 mg/dL — ABNORMAL HIGH (ref 2.5–4.6)

## 2017-02-04 LAB — CBC
HCT: 35.2 % — ABNORMAL LOW (ref 36.0–46.0)
HEMOGLOBIN: 11 g/dL — AB (ref 12.0–15.0)
MCH: 29.2 pg (ref 26.0–34.0)
MCHC: 31.3 g/dL (ref 30.0–36.0)
MCV: 93.4 fL (ref 78.0–100.0)
Platelets: 256 10*3/uL (ref 150–400)
RBC: 3.77 MIL/uL — AB (ref 3.87–5.11)
RDW: 16.1 % — ABNORMAL HIGH (ref 11.5–15.5)
WBC: 5.9 10*3/uL (ref 4.0–10.5)

## 2017-02-04 MED ORDER — DARBEPOETIN ALFA 40 MCG/0.4ML IJ SOSY
PREFILLED_SYRINGE | INTRAMUSCULAR | Status: AC
  Filled 2017-02-04: qty 0.4

## 2017-02-04 MED ORDER — DARBEPOETIN ALFA 40 MCG/0.4ML IJ SOSY
40.0000 ug | PREFILLED_SYRINGE | INTRAMUSCULAR | Status: DC
  Administered 2017-02-04: 40 ug via SUBCUTANEOUS

## 2017-02-24 ENCOUNTER — Ambulatory Visit (HOSPITAL_COMMUNITY)
Admission: RE | Admit: 2017-02-24 | Discharge: 2017-02-24 | Disposition: A | Discharge status: Home / Self Care | Payer: Medicare Other | Source: Ambulatory Visit | Attending: Surgery | Admitting: Surgery

## 2017-02-24 ENCOUNTER — Ambulatory Visit (INDEPENDENT_AMBULATORY_CARE_PROVIDER_SITE_OTHER)
Admission: RE | Admit: 2017-02-24 | Discharge: 2017-02-24 | Disposition: A | Discharge status: Home / Self Care | Payer: Medicare Other | Source: Ambulatory Visit | Attending: Surgery | Admitting: Surgery

## 2017-02-24 ENCOUNTER — Ambulatory Visit: Payer: Medicare Other | Admitting: Surgery

## 2017-02-24 ENCOUNTER — Encounter: Payer: Self-pay | Admitting: Surgery

## 2017-02-24 VITALS — BP 167/87 | HR 65 | Temp 97.1°F | Resp 18 | Ht 64.5 in | Wt 122.0 lb

## 2017-02-24 DIAGNOSIS — N185 Chronic kidney disease, stage 5: Secondary | ICD-10-CM | POA: Diagnosis not present

## 2017-02-24 DIAGNOSIS — Z01810 Encounter for preprocedural cardiovascular examination: Secondary | ICD-10-CM

## 2017-02-24 DIAGNOSIS — N184 Chronic kidney disease, stage 4 (severe): Secondary | ICD-10-CM | POA: Diagnosis not present

## 2017-02-24 NOTE — Progress Notes (Signed)
[facility] [place]  Patient name: Kim Burns MRN: [id number] DOB: 07/17/1935 Sex: female   REQUESTING PROVIDER:    Dr. Marval Regal   REASON FOR CONSULT:    CKD  HISTORY OF PRESENT ILLNESS:   Kim Burns is a 82 y.o. female, who is referred today for evaluation of fistula creation.  Patient has stage III renal insufficiency secondary to hypertensive nephrosclerosis versus chronic interstitial nephritis.  She is right-handed  Patient does have history of stroke in 1995.  She has a history of primary hyperparathyroidism.  She has undergone removal of a parathyroid adenoma.  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  She now has issues with diarrhea.  She did have a bleeding gastric ulcer.  She takes a statin for hypercholesterolemia  PAST MEDICAL HISTORY    Past Medical History:  Diagnosis Date  . Anemia of chronic disease 1985   hysterectomy fibroid tumors  . Blood transfusion without reported diagnosis   . Bradycardia   . CKD (chronic kidney disease) stage 3, GFR 30-59 ml/min [facility])    sees dr Arty Baumgartner sees next in jan 2018  . Dysrhythmia 04/16/2016   bradycardia due to medication   . GERD (gastroesophageal reflux disease)   . Gout   . Headache   . History of kidney stones 06/2013  . Hyperparathyroidism ([place])   . Hypertension   . Itching    last year  . PONV (postoperative nausea and vomiting)   . Sleep apnea    no cpap machine. could not tolerate  . Stroke ([place]) 04/16/2016   TIA 1995  . Thyroid disease   . TIA (transient ischemic attack) 1996   x 1  . Vitamin D deficiency      FAMILY HISTORY   Family History  Problem Relation Age of Onset  . Heart disease Mother   . Stroke Mother   . Cancer Father        colon  . Alzheimer's disease Sister   . Cancer Brother        brain  . Alzheimer's disease Sister     SOCIAL HISTORY:     Social History   Socioeconomic History  . Marital status: Widowed    Spouse name: Not on file  . Number of children: Not on file  . Years of education: Not on file  . Highest education level: Not on file  Social Needs  . Financial resource strain: Not on file  . Food insecurity - worry: Not on file  . Food insecurity - inability: Not on file  . Transportation needs - medical: Not on file  . Transportation needs - non-medical: Not on file  Occupational History  . Not on file  Tobacco Use  . Smoking status: Never Smoker  . Smokeless tobacco: Never Used  Substance and Sexual Activity  . Alcohol use: No  . Drug use: No  . Sexual activity: Not on file  Other Topics Concern  . Not on file  Social History Narrative  . Not on file    ALLERGIES:    Allergies  Allergen Reactions  . Codeine Other (See Comments)    headache  . Doxycycline Other (See Comments)    Pt didn't really remember about reaction.  . Gemfibrozil Other (See Comments)    Myalgia   . Hydrocodone-Acetaminophen Other (See Comments)    Headache  . Lotemax [Loteprednol Etabonate]     burning  .  Statins Other (See Comments)    Muscle weakness    CURRENT MEDICATIONS:    Current Outpatient Medications  Medication Sig Dispense Refill  . Artificial Tear Solution (GENTEAL TEARS OP) Place 1 drop into the right eye 2 (two) times daily as needed (dry eyes).     Marland Kitchen azelastine (ASTELIN) 0.1 % nasal spray Place 1 spray into both nostrils at bedtime as needed for rhinitis. Use in each nostril as directed    . calcitRIOL (ROCALTROL) 0.25 MCG capsule Take 0.25 mcg by mouth every Monday, Wednesday, and Friday. At night    . cinacalcet (SENSIPAR) 30 MG tablet Take 30 mg by mouth daily with supper.    . diclofenac sodium (VOLTAREN) 1 % GEL Apply 2 g topically 4 (four) times daily as needed (for hand pain).     Marland Kitchen diphenhydramine-acetaminophen (TYLENOL PM) 25-500 MG TABS Take 2 tablets by mouth at bedtime as needed (for  sleep).     Marland Kitchen escitalopram (LEXAPRO) 5 MG tablet Take 15 mg by mouth at bedtime.     . fenofibrate (TRICOR) 145 MG tablet Take 72.5 mg by mouth every morning.     . ferrous sulfate 325 (65 FE) MG EC tablet Take 1 tablet (325 mg total) by mouth 2 (two) times daily. 60 tablet 0  . insulin lispro (HUMALOG) 100 UNIT/ML injection Inject into the skin 3 (three) times daily before meals. Sliding scale before meals three times a day  150-200 Give 2 units 200-250 give 3 units 250-300 give 4 units    . metoprolol tartrate (LOPRESSOR) 25 MG tablet Take 25 mg daily by mouth.    . potassium chloride (K-DUR,KLOR-CON) 10 MEQ tablet Take 1 tablet (10 mEq total) by mouth daily. 7 tablet 0  . sodium bicarbonate 650 MG tablet Take 2 tablets (1,300 mg total) by mouth 2 (two) times daily. 60 tablet 0  . acetaminophen (TYLENOL) 325 MG tablet Take 325 mg by mouth every 6 (six) hours as needed for moderate pain.     . brimonidine-timolol (COMBIGAN) 0.2-0.5 % ophthalmic solution Place 1 drop into the left eye every 12 (twelve) hours.    . clopidogrel (PLAVIX) 75 MG tablet Take 1 tablet (75 mg total) by mouth every morning. Resume plavix from 11/15/16 (Patient not taking: Reported on 02/24/2017)    . ondansetron (ZOFRAN) 4 MG tablet Take 1 tablet (4 mg total) by mouth every 6 (six) hours as needed for nausea. (Patient not taking: Reported on 02/24/2017) 20 tablet 0  . Pancrelipase, Lip-Prot-Amyl, (CREON PO) Take 1-2 capsules by mouth 3 (three) times daily as needed (for loose stool). Take 2 caps before meals; take 1 caps before snack.    . pantoprazole (PROTONIX) 40 MG tablet Take 1 tablet (40 mg total) by mouth 2 (two) times daily before a meal. 60 tablet 0  . prednisoLONE acetate (PRED FORTE) 1 % ophthalmic suspension Place 1 drop into the left eye 4 (four) times daily.    . valACYclovir (VALTREX) 500 MG tablet Take 500 mg by mouth daily.     No current facility-administered medications for this visit.     REVIEW OF  SYSTEMS:   [X]  denotes positive finding, [ ]  denotes negative finding Cardiac  Comments:  Chest pain or chest pressure:    Shortness of breath upon exertion:    Short of breath when lying flat:    Irregular heart rhythm:        Vascular    Pain in calf, thigh, or hip  brought on by ambulation:    Pain in feet at night that wakes you up from your sleep:     Blood clot in your veins:    Leg swelling:         Pulmonary    Oxygen at home:    Productive cough:     Wheezing:         Neurologic    Sudden weakness in arms or legs:     Sudden numbness in arms or legs:     Sudden onset of difficulty speaking or slurred speech:    Temporary loss of vision in one eye:     Problems with dizziness:         Gastrointestinal    Blood in stool:      Vomited blood:         Genitourinary    Burning when urinating:     Blood in urine:        Psychiatric    Major depression:         Hematologic    Bleeding problems:    Problems with blood clotting too easily:        Skin    Rashes or ulcers:        Constitutional    Fever or chills:     PHYSICAL EXAM:   Vitals:   02/24/17 1222 02/24/17 1225  BP: (!) 162/91 (!) 167/87  Pulse: 65   Resp: 18   Temp: (!) 97.1 F (36.2 C)   TempSrc: Oral   SpO2: 100%   Weight: 122 lb (55.3 kg)   Height: 5' 4.5" (1.638 m)     GENERAL: The patient is a well-nourished female, in no acute distress. The vital signs are documented above. CARDIAC: There is a regular rate and rhythm.  VASCULAR: Palpable radial brachial pulse bilaterally PULMONARY: Nonlabored respirations MUSCULOSKELETAL: There are no major deformities or cyanosis. NEUROLOGIC: No focal weakness or paresthesias are detected. SKIN: There are no ulcers or rashes noted. PSYCHIATRIC: The patient has a normal affect.  STUDIES:   I have reviewed her vascular lab studies.  She has normal arterial study. Vein mapping shows suboptimal cephalic and basilic veins on the right as well as  cephalic vein on the left.  She has a marginal basilic vein in her left arm.  ASSESSMENT and PLAN   I discussed with the patient that I feel her veins are very small.  She does have a marginal basilic vein on the left.  I feel it would be reasonable to attempt a first stage basilic vein fistula to see if we can get this to mature.  She understands that if this does not mature that she would need a graft.  We would not put a graft in until she gets closer to dialysis.  The patient will contact me with a time for her surgery.   Annamarie Major, MD [facility] [phone] Pager [phone]

## 2017-02-28 ENCOUNTER — Other Ambulatory Visit: Payer: Self-pay | Admitting: *Deleted

## 2017-02-28 NOTE — Progress Notes (Signed)
Patient instructed to be at [facility] admitting at [place] on 03/19/17 for surgery. NPO past MN night prior and to follow the detailed instructions she receives from the [facility] pre-admission testing department. Must have driver home.

## 2017-03-04 ENCOUNTER — Encounter (HOSPITAL_COMMUNITY)
Admission: RE | Admit: 2017-03-04 | Discharge: 2017-03-04 | Disposition: A | Discharge status: Home / Self Care | Payer: Medicare Other | Source: Ambulatory Visit | Attending: Nephrology | Admitting: Nephrology

## 2017-03-04 VITALS — BP 170/83 | HR 60 | Temp 97.5°F | Resp 20

## 2017-03-04 DIAGNOSIS — N179 Acute kidney failure, unspecified: Secondary | ICD-10-CM

## 2017-03-04 DIAGNOSIS — N185 Chronic kidney disease, stage 5: Secondary | ICD-10-CM | POA: Diagnosis present

## 2017-03-04 LAB — CBC
HEMATOCRIT: 39.3 % (ref 36.0–46.0)
Hemoglobin: 12.2 g/dL (ref 12.0–15.0)
MCH: 28.4 pg (ref 26.0–34.0)
MCHC: 31 g/dL (ref 30.0–36.0)
MCV: 91.6 fL (ref 78.0–100.0)
PLATELETS: 262 10*3/uL (ref 150–400)
RBC: 4.29 MIL/uL (ref 3.87–5.11)
RDW: 15.1 % (ref 11.5–15.5)
WBC: 7.1 10*3/uL (ref 4.0–10.5)

## 2017-03-04 LAB — COMPREHENSIVE METABOLIC PANEL
ALT: 12 U/L — AB (ref 14–54)
ANION GAP: 8 (ref 5–15)
AST: 21 U/L (ref 15–41)
Albumin: 3.5 g/dL (ref 3.5–5.0)
Alkaline Phosphatase: 52 U/L (ref 38–126)
BUN: 25 mg/dL — ABNORMAL HIGH (ref 6–20)
CALCIUM: 8.8 mg/dL — AB (ref 8.9–10.3)
CHLORIDE: 109 mmol/L (ref 101–111)
CO2: 22 mmol/L (ref 22–32)
CREATININE: 2.53 mg/dL — AB (ref 0.44–1.00)
GFR, EST AFRICAN AMERICAN: 20 mL/min — AB (ref >60)
GFR, EST NON AFRICAN AMERICAN: 17 mL/min — AB (ref >60)
Glucose, Bld: 119 mg/dL — ABNORMAL HIGH (ref 65–99)
Potassium: 4.5 mmol/L (ref 3.5–5.1)
Sodium: 139 mmol/L (ref 135–145)
Total Bilirubin: 0.6 mg/dL (ref 0.3–1.2)
Total Protein: 6.1 g/dL — ABNORMAL LOW (ref 6.5–8.1)

## 2017-03-04 LAB — IRON AND TIBC
IRON: 86 ug/dL (ref 28–170)
Saturation Ratios: 24 % (ref 10.4–31.8)
TIBC: 354 ug/dL (ref 250–450)
UIBC: 268 ug/dL

## 2017-03-04 LAB — PHOSPHORUS: PHOSPHORUS: 4.1 mg/dL (ref 2.5–4.6)

## 2017-03-04 LAB — FERRITIN: FERRITIN: 101 ng/mL (ref 11–307)

## 2017-03-04 MED ORDER — DARBEPOETIN ALFA 40 MCG/0.4ML IJ SOSY: 40.0000 ug | PREFILLED_SYRINGE | INTRAMUSCULAR | Status: DC

## 2017-03-11 ENCOUNTER — Other Ambulatory Visit: Payer: Self-pay | Admitting: General Surgery

## 2017-03-11 DIAGNOSIS — D490 Neoplasm of unspecified behavior of digestive system: Secondary | ICD-10-CM

## 2017-03-18 ENCOUNTER — Ambulatory Visit (HOSPITAL_COMMUNITY)
Admission: RE | Admit: 2017-03-18 | Discharge: 2017-03-18 | Disposition: A | Discharge status: Home / Self Care | Payer: Medicare Other | Source: Ambulatory Visit | Attending: Nephrology | Admitting: Nephrology

## 2017-03-18 ENCOUNTER — Telehealth: Payer: Self-pay | Admitting: *Deleted

## 2017-03-18 VITALS — BP 158/84 | HR 61 | Temp 97.5°F | Resp 20

## 2017-03-18 DIAGNOSIS — N184 Chronic kidney disease, stage 4 (severe): Secondary | ICD-10-CM | POA: Insufficient documentation

## 2017-03-18 DIAGNOSIS — D631 Anemia in chronic kidney disease: Secondary | ICD-10-CM | POA: Insufficient documentation

## 2017-03-18 DIAGNOSIS — N179 Acute kidney failure, unspecified: Secondary | ICD-10-CM

## 2017-03-18 MED ORDER — DARBEPOETIN ALFA 40 MCG/0.4ML IJ SOSY
40.0000 ug | PREFILLED_SYRINGE | INTRAMUSCULAR | Status: DC
  Administered 2017-03-18: 40 ug via SUBCUTANEOUS

## 2017-03-18 MED ORDER — DARBEPOETIN ALFA 40 MCG/0.4ML IJ SOSY
PREFILLED_SYRINGE | INTRAMUSCULAR | Status: AC
  Filled 2017-03-18: qty 0.4

## 2017-03-18 NOTE — Telephone Encounter (Signed)
Patient called to cancel surgery for 1/30 due to weather and desired reschedule. First available is 2/21 which patient will be out of town and does not know how long she will be gone. Suggested patient call to schedule when she returns.

## 2017-03-19 ENCOUNTER — Ambulatory Visit (HOSPITAL_COMMUNITY): Admission: RE | Admit: 2017-03-19 | Payer: Medicare Other | Source: Ambulatory Visit | Admitting: Surgery

## 2017-03-19 ENCOUNTER — Encounter (HOSPITAL_COMMUNITY): Admission: RE | Payer: Self-pay | Source: Ambulatory Visit

## 2017-03-19 LAB — POCT HEMOGLOBIN-HEMACUE: Hemoglobin: 11.2 g/dL — ABNORMAL LOW (ref 12.0–15.0)

## 2017-03-19 SURGERY — TRANSPOSITION, VEIN, BASILIC
Anesthesia: Monitor Anesthesia Care | Laterality: Left

## 2017-03-26 ENCOUNTER — Ambulatory Visit
Admission: RE | Admit: 2017-03-26 | Discharge: 2017-03-26 | Disposition: A | Discharge status: Home / Self Care | Payer: Medicare Other | Source: Ambulatory Visit | Attending: General Surgery | Admitting: General Surgery

## 2017-03-26 ENCOUNTER — Other Ambulatory Visit: Payer: Medicare Other

## 2017-03-26 DIAGNOSIS — D490 Neoplasm of unspecified behavior of digestive system: Secondary | ICD-10-CM

## 2017-04-15 ENCOUNTER — Ambulatory Visit (HOSPITAL_COMMUNITY)
Admission: RE | Admit: 2017-04-15 | Discharge: 2017-04-15 | Disposition: A | Discharge status: Home / Self Care | Payer: Medicare Other | Source: Ambulatory Visit | Attending: Nephrology | Admitting: Nephrology

## 2017-04-15 VITALS — BP 160/82 | HR 75 | Temp 97.5°F | Resp 20

## 2017-04-15 DIAGNOSIS — N179 Acute kidney failure, unspecified: Secondary | ICD-10-CM

## 2017-04-15 DIAGNOSIS — N184 Chronic kidney disease, stage 4 (severe): Secondary | ICD-10-CM | POA: Diagnosis present

## 2017-04-15 DIAGNOSIS — D631 Anemia in chronic kidney disease: Secondary | ICD-10-CM | POA: Diagnosis present

## 2017-04-15 LAB — COMPREHENSIVE METABOLIC PANEL
ALBUMIN: 3.4 g/dL — AB (ref 3.5–5.0)
ALT: 16 U/L (ref 14–54)
AST: 23 U/L (ref 15–41)
Alkaline Phosphatase: 45 U/L (ref 38–126)
Anion gap: 11 (ref 5–15)
BILIRUBIN TOTAL: 0.6 mg/dL (ref 0.3–1.2)
BUN: 32 mg/dL — AB (ref 6–20)
CHLORIDE: 107 mmol/L (ref 101–111)
CO2: 25 mmol/L (ref 22–32)
Calcium: 9.6 mg/dL (ref 8.9–10.3)
Creatinine, Ser: 2.86 mg/dL — ABNORMAL HIGH (ref 0.44–1.00)
GFR calc Af Amer: 17 mL/min — ABNORMAL LOW (ref >60)
GFR calc non Af Amer: 15 mL/min — ABNORMAL LOW (ref >60)
GLUCOSE: 208 mg/dL — AB (ref 65–99)
POTASSIUM: 4 mmol/L (ref 3.5–5.1)
Sodium: 143 mmol/L (ref 135–145)
Total Protein: 6.6 g/dL (ref 6.5–8.1)

## 2017-04-15 LAB — CBC
HEMATOCRIT: 39.8 % (ref 36.0–46.0)
Hemoglobin: 12.5 g/dL (ref 12.0–15.0)
MCH: 29.2 pg (ref 26.0–34.0)
MCHC: 31.4 g/dL (ref 30.0–36.0)
MCV: 93 fL (ref 78.0–100.0)
Platelets: 228 10*3/uL (ref 150–400)
RBC: 4.28 MIL/uL (ref 3.87–5.11)
RDW: 14.9 % (ref 11.5–15.5)
WBC: 7.2 10*3/uL (ref 4.0–10.5)

## 2017-04-15 LAB — PHOSPHORUS: Phosphorus: 5 mg/dL — ABNORMAL HIGH (ref 2.5–4.6)

## 2017-04-15 MED ORDER — DARBEPOETIN ALFA 40 MCG/0.4ML IJ SOSY: 40.0000 ug | PREFILLED_SYRINGE | INTRAMUSCULAR | Status: DC

## 2017-04-16 LAB — PTH, INTACT AND CALCIUM
Calcium, Total (PTH): 9.8 mg/dL (ref 8.7–10.3)
PTH: 119 pg/mL — AB (ref 15–65)

## 2017-04-17 ENCOUNTER — Other Ambulatory Visit: Payer: Self-pay | Admitting: *Deleted

## 2017-04-17 ENCOUNTER — Telehealth: Payer: Self-pay | Admitting: *Deleted

## 2017-04-17 NOTE — Telephone Encounter (Signed)
Call to patient to be at [facility] admitting department at 7:30 am on 04/24/17 for first stage BVT left arm. NPO past MN night prior and must have a driver for home. Expect a call and follow the detailed instruction/medication instructions received from the pre-admission department. Verbalized understanding.

## 2017-04-22 ENCOUNTER — Other Ambulatory Visit: Payer: Self-pay

## 2017-04-22 ENCOUNTER — Encounter (HOSPITAL_COMMUNITY): Payer: Self-pay | Admitting: *Deleted

## 2017-04-22 NOTE — Progress Notes (Signed)
Spoke with pt for pre-op call. Pt denies cardiac history, chest pain or sob. Pt is diabetic since having a Whipple procedure last year. She states her fasting blood sugar is usually in the 80's to low 120's. States her last A1C was 6.0. Instructed pt to check her blood sugar Thursday morning when she gets up and every 2 hours until she leaves for the hospital. If blood sugar is >220 take 1/2 of usual correction dose of Novolog insulin. If blood sugar is 70 or below, treat with 1/2 cup of clear juice (apple or cranberry) and recheck blood sugar 15 minutes after drinking juice. If blood sugar continues to be 70 or below, call the Short Stay department and ask to speak to a nurse. Pt voiced understanding.

## 2017-04-24 ENCOUNTER — Ambulatory Visit (HOSPITAL_COMMUNITY): Payer: Medicare Other | Admitting: Anesthesiology

## 2017-04-24 ENCOUNTER — Encounter (HOSPITAL_COMMUNITY)
Admission: RE | Disposition: A | Discharge status: Home / Self Care | Payer: Self-pay | Source: Ambulatory Visit | Attending: Surgery

## 2017-04-24 ENCOUNTER — Ambulatory Visit (HOSPITAL_COMMUNITY)
Admission: RE | Admit: 2017-04-24 | Discharge: 2017-04-24 | Disposition: A | Discharge status: Home / Self Care | Payer: Medicare Other | Source: Ambulatory Visit | Attending: Surgery | Admitting: Surgery

## 2017-04-24 DIAGNOSIS — G4730 Sleep apnea, unspecified: Secondary | ICD-10-CM | POA: Insufficient documentation

## 2017-04-24 DIAGNOSIS — Z794 Long term (current) use of insulin: Secondary | ICD-10-CM | POA: Insufficient documentation

## 2017-04-24 DIAGNOSIS — I129 Hypertensive chronic kidney disease with stage 1 through stage 4 chronic kidney disease, or unspecified chronic kidney disease: Secondary | ICD-10-CM | POA: Insufficient documentation

## 2017-04-24 DIAGNOSIS — E1122 Type 2 diabetes mellitus with diabetic chronic kidney disease: Secondary | ICD-10-CM | POA: Diagnosis not present

## 2017-04-24 DIAGNOSIS — Z885 Allergy status to narcotic agent status: Secondary | ICD-10-CM | POA: Insufficient documentation

## 2017-04-24 DIAGNOSIS — N183 Chronic kidney disease, stage 3 unspecified: Secondary | ICD-10-CM

## 2017-04-24 DIAGNOSIS — E7800 Pure hypercholesterolemia, unspecified: Secondary | ICD-10-CM | POA: Insufficient documentation

## 2017-04-24 DIAGNOSIS — Z881 Allergy status to other antibiotic agents status: Secondary | ICD-10-CM | POA: Diagnosis not present

## 2017-04-24 DIAGNOSIS — M109 Gout, unspecified: Secondary | ICD-10-CM | POA: Insufficient documentation

## 2017-04-24 DIAGNOSIS — Z87442 Personal history of urinary calculi: Secondary | ICD-10-CM | POA: Insufficient documentation

## 2017-04-24 DIAGNOSIS — Z8673 Personal history of transient ischemic attack (TIA), and cerebral infarction without residual deficits: Secondary | ICD-10-CM | POA: Diagnosis not present

## 2017-04-24 DIAGNOSIS — N185 Chronic kidney disease, stage 5: Secondary | ICD-10-CM | POA: Diagnosis not present

## 2017-04-24 DIAGNOSIS — K219 Gastro-esophageal reflux disease without esophagitis: Secondary | ICD-10-CM | POA: Diagnosis not present

## 2017-04-24 DIAGNOSIS — Z888 Allergy status to other drugs, medicaments and biological substances status: Secondary | ICD-10-CM | POA: Insufficient documentation

## 2017-04-24 DIAGNOSIS — E210 Primary hyperparathyroidism: Secondary | ICD-10-CM | POA: Diagnosis not present

## 2017-04-24 DIAGNOSIS — Z79899 Other long term (current) drug therapy: Secondary | ICD-10-CM | POA: Diagnosis not present

## 2017-04-24 DIAGNOSIS — E559 Vitamin D deficiency, unspecified: Secondary | ICD-10-CM | POA: Insufficient documentation

## 2017-04-24 HISTORY — DX: Diarrhea, unspecified: R19.7

## 2017-04-24 HISTORY — DX: Type 2 diabetes mellitus without complications: E11.9

## 2017-04-24 HISTORY — DX: Chronic or unspecified peptic ulcer, site unspecified, with hemorrhage: K27.4

## 2017-04-24 HISTORY — PX: BASCILIC VEIN TRANSPOSITION: SHX5742

## 2017-04-24 HISTORY — DX: Chronic or unspecified gastrojejunal ulcer with hemorrhage: K28.4

## 2017-04-24 LAB — GLUCOSE, CAPILLARY
GLUCOSE-CAPILLARY: 96 mg/dL (ref 65–99)
Glucose-Capillary: 106 mg/dL — ABNORMAL HIGH (ref 65–99)

## 2017-04-24 LAB — POCT I-STAT 4, (NA,K, GLUC, HGB,HCT)
Glucose, Bld: 119 mg/dL — ABNORMAL HIGH (ref 65–99)
HCT: 35 % — ABNORMAL LOW (ref 36.0–46.0)
HEMOGLOBIN: 11.9 g/dL — AB (ref 12.0–15.0)
Potassium: 4 mmol/L (ref 3.5–5.1)
SODIUM: 147 mmol/L — AB (ref 135–145)

## 2017-04-24 SURGERY — TRANSPOSITION, VEIN, BASILIC
Anesthesia: Monitor Anesthesia Care | Site: Arm Upper | Laterality: Left

## 2017-04-24 MED ORDER — SODIUM CHLORIDE 0.9 % IV SOLN
INTRAVENOUS | Status: DC
  Administered 2017-04-24: 10 mL/h via INTRAVENOUS
  Administered 2017-04-24: 10:00:00 via INTRAVENOUS

## 2017-04-24 MED ORDER — ONDANSETRON HCL 4 MG/2ML IJ SOLN
INTRAMUSCULAR | Status: DC | PRN
  Administered 2017-04-24: 4 mg via INTRAVENOUS

## 2017-04-24 MED ORDER — PROPOFOL 10 MG/ML IV BOLUS
INTRAVENOUS | Status: AC
  Filled 2017-04-24: qty 20

## 2017-04-24 MED ORDER — CEFAZOLIN SODIUM-DEXTROSE 2-4 GM/100ML-% IV SOLN
INTRAVENOUS | Status: AC
  Filled 2017-04-24: qty 100

## 2017-04-24 MED ORDER — PROPOFOL 500 MG/50ML IV EMUL
INTRAVENOUS | Status: DC | PRN
  Administered 2017-04-24: 50 ug/kg/min via INTRAVENOUS

## 2017-04-24 MED ORDER — LIDOCAINE HCL (CARDIAC) 20 MG/ML IV SOLN
INTRAVENOUS | Status: DC | PRN
  Administered 2017-04-24: 40 mg via INTRAVENOUS

## 2017-04-24 MED ORDER — CEFAZOLIN SODIUM-DEXTROSE 2-3 GM-%(50ML) IV SOLR
INTRAVENOUS | Status: DC | PRN
  Administered 2017-04-24: 2 g via INTRAVENOUS

## 2017-04-24 MED ORDER — FENTANYL CITRATE (PF) 250 MCG/5ML IJ SOLN
INTRAMUSCULAR | Status: AC
  Filled 2017-04-24: qty 5

## 2017-04-24 MED ORDER — HEPARIN SODIUM (PORCINE) 1000 UNIT/ML IJ SOLN
INTRAMUSCULAR | Status: DC | PRN
  Administered 2017-04-24: 3000 [IU] via INTRAVENOUS

## 2017-04-24 MED ORDER — 0.9 % SODIUM CHLORIDE (POUR BTL) OPTIME
TOPICAL | Status: DC | PRN
  Administered 2017-04-24: 1000 mL

## 2017-04-24 MED ORDER — LIDOCAINE-EPINEPHRINE (PF) 1 %-1:200000 IJ SOLN
INTRAMUSCULAR | Status: AC
  Filled 2017-04-24: qty 30

## 2017-04-24 MED ORDER — PROTAMINE SULFATE 10 MG/ML IV SOLN
INTRAVENOUS | Status: DC | PRN
  Administered 2017-04-24: 25 mg via INTRAVENOUS

## 2017-04-24 MED ORDER — LIDOCAINE-EPINEPHRINE (PF) 1 %-1:200000 IJ SOLN
INTRAMUSCULAR | Status: DC | PRN
  Administered 2017-04-24: 6 mL

## 2017-04-24 MED ORDER — FENTANYL CITRATE (PF) 100 MCG/2ML IJ SOLN
INTRAMUSCULAR | Status: DC | PRN
  Administered 2017-04-24: 100 ug via INTRAVENOUS

## 2017-04-24 MED ORDER — TRAMADOL HCL 50 MG PO TABS: 50.0000 mg | ORAL_TABLET | Freq: Three times a day (TID) | ORAL | 0 refills | Status: DC | PRN

## 2017-04-24 MED ORDER — PROPOFOL 10 MG/ML IV BOLUS
INTRAVENOUS | Status: DC | PRN
  Administered 2017-04-24: 20 mg via INTRAVENOUS

## 2017-04-24 MED ORDER — SODIUM CHLORIDE 0.9 % IV SOLN
INTRAVENOUS | Status: DC | PRN
  Administered 2017-04-24: 10:00:00

## 2017-04-24 SURGICAL SUPPLY — 37 items
ARMBAND PINK RESTRICT EXTREMIT (MISCELLANEOUS) ×2 IMPLANT
CANISTER SUCT 3000ML PPV (MISCELLANEOUS) ×2 IMPLANT
CLIP VESOCCLUDE MED 24/CT (CLIP) IMPLANT
CLIP VESOCCLUDE MED 6/CT (CLIP) IMPLANT
CLIP VESOCCLUDE SM WIDE 24/CT (CLIP) IMPLANT
CLIP VESOCCLUDE SM WIDE 6/CT (CLIP) IMPLANT
COVER PROBE W GEL 5X96 (DRAPES) ×2 IMPLANT
DECANTER SPIKE VIAL GLASS SM (MISCELLANEOUS) ×2 IMPLANT
DERMABOND ADVANCED (GAUZE/BANDAGES/DRESSINGS) ×1
DERMABOND ADVANCED .7 DNX12 (GAUZE/BANDAGES/DRESSINGS) ×1 IMPLANT
ELECT REM PT RETURN 9FT ADLT (ELECTROSURGICAL) ×2
ELECTRODE REM PT RTRN 9FT ADLT (ELECTROSURGICAL) ×1 IMPLANT
GLOVE BIOGEL PI IND STRL 7.5 (GLOVE) ×1 IMPLANT
GLOVE BIOGEL PI INDICATOR 7.5 (GLOVE) ×1
GLOVE SURG SS PI 7.5 STRL IVOR (GLOVE) ×2 IMPLANT
GOWN STRL REUS W/ TWL LRG LVL3 (GOWN DISPOSABLE) ×2 IMPLANT
GOWN STRL REUS W/ TWL XL LVL3 (GOWN DISPOSABLE) ×1 IMPLANT
GOWN STRL REUS W/TWL LRG LVL3 (GOWN DISPOSABLE) ×2
GOWN STRL REUS W/TWL XL LVL3 (GOWN DISPOSABLE) ×1
HEMOSTAT SNOW SURGICEL 2X4 (HEMOSTASIS) IMPLANT
KIT BASIN OR (CUSTOM PROCEDURE TRAY) ×2 IMPLANT
KIT ROOM TURNOVER OR (KITS) ×2 IMPLANT
NEEDLE HYPO 25GX1X1/2 BEV (NEEDLE) ×2 IMPLANT
NS IRRIG 1000ML POUR BTL (IV SOLUTION) ×2 IMPLANT
PACK PERIPHERAL VASCULAR (CUSTOM PROCEDURE TRAY) ×2 IMPLANT
PAD ARMBOARD 7.5X6 YLW CONV (MISCELLANEOUS) ×4 IMPLANT
STOCKINETTE 6  STRL (DRAPES) ×1
STOCKINETTE 6 STRL (DRAPES) ×1 IMPLANT
SUT PROLENE 6 0 CC (SUTURE) ×2 IMPLANT
SUT SILK 2 0 SH (SUTURE) IMPLANT
SUT VIC AB 3-0 SH 27 (SUTURE) ×1
SUT VIC AB 3-0 SH 27X BRD (SUTURE) ×1 IMPLANT
SUT VICRYL 4-0 PS2 18IN ABS (SUTURE) ×2 IMPLANT
SYR CONTROL 10ML LL (SYRINGE) ×2 IMPLANT
TOWEL GREEN STERILE (TOWEL DISPOSABLE) ×2 IMPLANT
UNDERPAD 30X30 (UNDERPADS AND DIAPERS) ×2 IMPLANT
WATER STERILE IRR 1000ML POUR (IV SOLUTION) ×2 IMPLANT

## 2017-04-24 NOTE — Anesthesia Preprocedure Evaluation (Addendum)
Anesthesia Evaluation  Patient identified by MRN, date of birth, ID band Patient awake    Reviewed: Allergy & Precautions, NPO status , Patient's Chart, lab work & pertinent test results  History of Anesthesia Complications Negative for: history of anesthetic complications  Airway Mallampati: II  TM Distance: >3 FB Neck ROM: Full    Dental  (+) Dental Advisory Given   Pulmonary sleep apnea (does not require CPAP) ,    breath sounds clear to auscultation       Cardiovascular hypertension, Pt. on medications and Pt. on home beta blockers (-) angina Rhythm:Regular Rate:Normal  '15 ECHO: EF 60-65%, mild-mod MR   Neuro/Psych TIA   GI/Hepatic Neg liver ROS, GERD  Medicated and Poorly Controlled,H/o Whipple for benign pancreatic mass   Endo/Other  diabetes (glu 119), Insulin Dependent  Renal/GU Renal disease (creat 2.86, K+ 4.0)     Musculoskeletal   Abdominal   Peds  Hematology negative hematology ROS (+)   Anesthesia Other Findings   Reproductive/Obstetrics                            Anesthesia Physical Anesthesia Plan  ASA: III  Anesthesia Plan: MAC   Post-op Pain Management:    Induction:   PONV Risk Score and Plan: 2 and Ondansetron and Treatment may vary due to age or medical condition  Airway Management Planned: Natural Airway and Simple Face Mask  Additional Equipment:   Intra-op Plan:   Post-operative Plan:   Informed Consent: I have reviewed the patients History and Physical, chart, labs and discussed the procedure including the risks, benefits and alternatives for the proposed anesthesia with the patient or authorized representative who has indicated his/her understanding and acceptance.   Dental advisory given  Plan Discussed with: CRNA and Surgeon  Anesthesia Plan Comments: (Plan routine monitors, MAC )        Anesthesia Quick Evaluation

## 2017-04-24 NOTE — Discharge Instructions (Signed)
° °  [facility]  Discharge Instructions  AV Fistula or Graft Surgery for Dialysis Access  Please refer to the following instructions for your post-procedure care. Your surgeon or physician assistant will discuss any changes with you.  Activity  You may drive the day following your surgery, if you are comfortable and no longer taking prescription pain medication. Resume full activity as the soreness in your incision resolves.  Bathing/Showering  You may shower after you go home. Keep your incision dry for 48 hours. Do not soak in a bathtub, hot tub, or swim until the incision heals completely. You may not shower if you have a hemodialysis catheter.  Incision Care  Clean your incision with mild soap and water after 48 hours. Pat the area dry with a clean towel. You do not need a bandage unless otherwise instructed. Do not apply any ointments or creams to your incision. You may have skin glue on your incision. Do not peel it off. It will come off on its own in about one week. Your arm may swell a bit after surgery. To reduce swelling use pillows to elevate your arm so it is above your heart. Your doctor will tell you if you need to lightly wrap your arm with an ACE bandage.  Diet  Resume your normal diet. There are not special food restrictions following this procedure. In order to heal from your surgery, it is CRITICAL to get adequate nutrition. Your body requires vitamins, minerals, and protein. Vegetables are the best source of vitamins and minerals. Vegetables also provide the perfect balance of protein. Processed food has little nutritional value, so try to avoid this.  Medications  Resume taking all of your medications. If your incision is causing pain, you may take over-the counter pain relievers such as acetaminophen (Tylenol). If you were prescribed a stronger pain medication, please be aware these medications can cause nausea and constipation. Prevent  nausea by taking the medication with a snack or meal. Avoid constipation by drinking plenty of fluids and eating foods with high amount of fiber, such as fruits, vegetables, and grains.  Do not take Tylenol if you are taking prescription pain medications.  Follow up Your surgeon may want to see you in the office following your access surgery. If so, this will be arranged at the time of your surgery.  Please call us immediately for any of the following conditions:  Increased pain, redness, drainage (pus) from your incision site Fever of 101 degrees or higher Severe or worsening pain at your incision site Hand pain or numbness.  Reduce your risk of vascular disease:  Stop smoking. If you would like help, call [facility] at 1-800-QUIT-NOW 202-308-3217) or [facility] at [place] your cholesterol Maintain a desired weight Control your diabetes Keep your blood pressure down  Dialysis  It will take several weeks to several months for your new dialysis access to be ready for use. Your surgeon will determine when it is okay to use it. Your nephrologist will continue to direct your dialysis. You can continue to use your Permcath until your new access is ready for use.   04/24/2017 Kim Burns [id number] [birth year]  Surgeon(s): Serafina Mitchell, MD  Procedure(s): LEFT ARM FIRST STAGE BASILIC VEIN TRANSPOSITION  x Do not stick fistula for 12 weeks    If you have any questions, please call the office at [phone].

## 2017-04-24 NOTE — Transfer of Care (Signed)
Immediate Anesthesia Transfer of Care Note  Patient: Kim Burns  Procedure(s) Performed: LEFT ARM FIRST STAGE BASILIC VEIN TRANSPOSITION (Left Arm Upper)  Patient Location: PACU  Anesthesia Type:MAC  Level of Consciousness: awake  Airway & Oxygen Therapy: Patient Spontanous Breathing and Patient connected to face mask oxygen  Post-op Assessment: Report given to RN and Post -op Vital signs reviewed and stable  Post vital signs: Reviewed and stable  Last Vitals:  Vitals:   04/24/17 0756 04/24/17 1103  BP: (!) 215/97   Pulse:    Resp:    Temp:  (P) 36.6 C  SpO2:      Last Pain:  Vitals:   04/24/17 0748  TempSrc: Oral      Patients Stated Pain Goal: 3 (69/48/54 6270)  Complications: No apparent anesthesia complications

## 2017-04-24 NOTE — H&P (Signed)
[facility] [place]  Patient name: Kim Burns       MRN: [id number]        DOB: [birth year]          Sex: female   REQUESTING PROVIDER:    Dr. Marval Regal   REASON FOR CONSULT:    CKD  HISTORY OF PRESENT ILLNESS:   Kim Burns is a 82 y.o. female, who is referred today for evaluation of fistula creation.  Patient has stage III renal insufficiency secondary to hypertensive nephrosclerosis versus chronic interstitial nephritis.  She is right-handed  Patient does have history of stroke in 1995.  She has a history of primary hyperparathyroidism.  She has undergone removal of a parathyroid adenoma.  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  She now has issues with diarrhea.  She did have a bleeding gastric ulcer.  She takes a statin for hypercholesterolemia  PAST MEDICAL HISTORY        Past Medical History:  Diagnosis Date  . Anemia of chronic disease 1985   hysterectomy fibroid tumors  . Blood transfusion without reported diagnosis   . Bradycardia   . CKD (chronic kidney disease) stage 3, GFR 30-59 ml/min [facility])    sees dr Arty Baumgartner sees next in jan 2018  . Dysrhythmia 04/16/2016   bradycardia due to medication   . GERD (gastroesophageal reflux disease)   . Gout   . Headache   . History of kidney stones 06/2013  . Hyperparathyroidism ([place])   . Hypertension   . Itching    last year  . PONV (postoperative nausea and vomiting)   . Sleep apnea    no cpap machine. could not tolerate  . Stroke ([place]) 04/16/2016   TIA 1995  . Thyroid disease   . TIA (transient ischemic attack) 1996   x 1  . Vitamin D deficiency      FAMILY HISTORY        Family History  Problem Relation Age of Onset  . Heart disease Mother   . Stroke Mother   . Cancer Father        colon  . Alzheimer's disease Sister   . Cancer Brother        brain    . Alzheimer's disease Sister     SOCIAL HISTORY:   Social History        Socioeconomic History  . Marital status: Widowed    Spouse name: Not on file  . Number of children: Not on file  . Years of education: Not on file  . Highest education level: Not on file  Social Needs  . Financial resource strain: Not on file  . Food insecurity - worry: Not on file  . Food insecurity - inability: Not on file  . Transportation needs - medical: Not on file  . Transportation needs - non-medical: Not on file  Occupational History  . Not on file  Tobacco Use  . Smoking status: Never Smoker  . Smokeless tobacco: Never Used  Substance and Sexual Activity  . Alcohol use: No  . Drug use: No  . Sexual activity: Not on file  Other Topics Concern  . Not on file  Social History Narrative  . Not on file    ALLERGIES:         Allergies  Allergen Reactions  . Codeine Other (See Comments)    headache  . Doxycycline Other (See  Comments)    Pt didn't really remember about reaction.  . Gemfibrozil Other (See Comments)    Myalgia   . Hydrocodone-Acetaminophen Other (See Comments)    Headache  . Lotemax [Loteprednol Etabonate]     burning  . Statins Other (See Comments)    Muscle weakness    CURRENT MEDICATIONS:          Current Outpatient Medications  Medication Sig Dispense Refill  . Artificial Tear Solution (GENTEAL TEARS OP) Place 1 drop into the right eye 2 (two) times daily as needed (dry eyes).     Marland Kitchen azelastine (ASTELIN) 0.1 % nasal spray Place 1 spray into both nostrils at bedtime as needed for rhinitis. Use in each nostril as directed    . calcitRIOL (ROCALTROL) 0.25 MCG capsule Take 0.25 mcg by mouth every Monday, Wednesday, and Friday. At night    . cinacalcet (SENSIPAR) 30 MG tablet Take 30 mg by mouth daily with supper.    . diclofenac sodium (VOLTAREN) 1 % GEL Apply 2 g topically 4 (four) times daily as needed (for hand pain).      Marland Kitchen diphenhydramine-acetaminophen (TYLENOL PM) 25-500 MG TABS Take 2 tablets by mouth at bedtime as needed (for sleep).     Marland Kitchen escitalopram (LEXAPRO) 5 MG tablet Take 15 mg by mouth at bedtime.     . fenofibrate (TRICOR) 145 MG tablet Take 72.5 mg by mouth every morning.     . ferrous sulfate 325 (65 FE) MG EC tablet Take 1 tablet (325 mg total) by mouth 2 (two) times daily. 60 tablet 0  . insulin lispro (HUMALOG) 100 UNIT/ML injection Inject into the skin 3 (three) times daily before meals. Sliding scale before meals three times a day  150-200 Give 2 units 200-250 give 3 units 250-300 give 4 units    . metoprolol tartrate (LOPRESSOR) 25 MG tablet Take 25 mg daily by mouth.    . potassium chloride (K-DUR,KLOR-CON) 10 MEQ tablet Take 1 tablet (10 mEq total) by mouth daily. 7 tablet 0  . sodium bicarbonate 650 MG tablet Take 2 tablets (1,300 mg total) by mouth 2 (two) times daily. 60 tablet 0  . acetaminophen (TYLENOL) 325 MG tablet Take 325 mg by mouth every 6 (six) hours as needed for moderate pain.     . brimonidine-timolol (COMBIGAN) 0.2-0.5 % ophthalmic solution Place 1 drop into the left eye every 12 (twelve) hours.    . clopidogrel (PLAVIX) 75 MG tablet Take 1 tablet (75 mg total) by mouth every morning. Resume plavix from 11/15/16 (Patient not taking: Reported on 02/24/2017)    . ondansetron (ZOFRAN) 4 MG tablet Take 1 tablet (4 mg total) by mouth every 6 (six) hours as needed for nausea. (Patient not taking: Reported on 02/24/2017) 20 tablet 0  . Pancrelipase, Lip-Prot-Amyl, (CREON PO) Take 1-2 capsules by mouth 3 (three) times daily as needed (for loose stool). Take 2 caps before meals; take 1 caps before snack.    . pantoprazole (PROTONIX) 40 MG tablet Take 1 tablet (40 mg total) by mouth 2 (two) times daily before a meal. 60 tablet 0  . prednisoLONE acetate (PRED FORTE) 1 % ophthalmic suspension Place 1 drop into the left eye 4 (four) times daily.    . valACYclovir (VALTREX)  500 MG tablet Take 500 mg by mouth daily.     No current facility-administered medications for this visit.     REVIEW OF SYSTEMS:   [X]  denotes positive finding, [ ]  denotes negative finding  Cardiac  Comments:  Chest pain or chest pressure:    Shortness of breath upon exertion:    Short of breath when lying flat:    Irregular heart rhythm:        Vascular    Pain in calf, thigh, or hip brought on by ambulation:    Pain in feet at night that wakes you up from your sleep:     Blood clot in your veins:    Leg swelling:         Pulmonary    Oxygen at home:    Productive cough:     Wheezing:         Neurologic    Sudden weakness in arms or legs:     Sudden numbness in arms or legs:     Sudden onset of difficulty speaking or slurred speech:    Temporary loss of vision in one eye:     Problems with dizziness:         Gastrointestinal    Blood in stool:      Vomited blood:         Genitourinary    Burning when urinating:     Blood in urine:        Psychiatric    Major depression:         Hematologic    Bleeding problems:    Problems with blood clotting too easily:        Skin    Rashes or ulcers:        Constitutional    Fever or chills:     PHYSICAL EXAM:       Vitals:   02/24/17 1222 02/24/17 1225  BP: (!) 162/91 (!) 167/87  Pulse: 65   Resp: 18   Temp: (!) 97.1 F (36.2 C)   TempSrc: Oral   SpO2: 100%   Weight: 122 lb (55.3 kg)   Height: 5' 4.5" (1.638 m)     GENERAL: The patient is a well-nourished female, in no acute distress. The vital signs are documented above. CARDIAC: There is a regular rate and rhythm.  VASCULAR: Palpable radial brachial pulse bilaterally PULMONARY: Nonlabored respirations MUSCULOSKELETAL: There are no major deformities or cyanosis. NEUROLOGIC: No focal weakness or paresthesias are detected. SKIN: There are  no ulcers or rashes noted. PSYCHIATRIC: The patient has a normal affect.  STUDIES:   I have reviewed her vascular lab studies.  She has normal arterial study. Vein mapping shows suboptimal cephalic and basilic veins on the right as well as cephalic vein on the left.  She has a marginal basilic vein in her left arm.  ASSESSMENT and PLAN   I discussed with the patient that I feel her veins are very small.  She does have a marginal basilic vein on the left.  I feel it would be reasonable to attempt a first stage basilic vein fistula to see if we can get this to mature.  She understands that if this does not mature that she would need a graft.  We would not put a graft in until she gets closer to dialysis.  The patient will contact me with a time for her surgery.   Annamarie Major, MD [facility] [phone] Pager [phone]

## 2017-04-25 ENCOUNTER — Encounter (HOSPITAL_COMMUNITY): Payer: Self-pay | Admitting: Surgery

## 2017-04-25 LAB — GLUCOSE, CAPILLARY: Glucose-Capillary: 95 mg/dL (ref 65–99)

## 2017-04-25 NOTE — Anesthesia Postprocedure Evaluation (Signed)
Anesthesia Post Note  Patient: Kim Burns  Procedure(s) Performed: LEFT ARM FIRST STAGE BASILIC VEIN TRANSPOSITION (Left Arm Upper)     Patient location during evaluation: PACU Anesthesia Type: MAC Level of consciousness: awake and alert, patient cooperative and oriented Pain management: pain level controlled Vital Signs Assessment: post-procedure vital signs reviewed and stable Respiratory status: spontaneous breathing, nonlabored ventilation and respiratory function stable Cardiovascular status: blood pressure returned to baseline and stable Postop Assessment: no apparent nausea or vomiting Anesthetic complications: no    Last Vitals:  Vitals:   04/24/17 1133 04/24/17 1148  BP: (!) 174/82 (!) 171/76  Pulse: 61 61  Resp: 12 14  Temp:  36.6 C  SpO2: 99% 100%    Last Pain:  Vitals:   04/24/17 0748  TempSrc: Oral                 Hadrian Yarbrough,E. Ladan Vanderzanden

## 2017-04-25 NOTE — Op Note (Signed)
    Patient name: Kim Burns MRN: [id number] DOB: [birth year] Sex: female  04/24/2017 Pre-operative Diagnosis: CKD Post-operative diagnosis:  Same Surgeon:  Annamarie Major Assistants:  S. Rhyne Procedure:   First stage left basilic vein fistula creation Anesthesia: MAC Blood Loss: Minimal Specimens: None  Findings: 3 mm vein  Indications: On the patient's vein mapping, her only possible option was a basilic vein fistula.  I explained to her that her vein was marginal but that we would attempt fistula creation.  If this does not mature she would need a graft, closer to the time of her dialysis initiation  Procedure:  The patient was identified in the holding area and taken to [place] [address]  The patient was then placed supine on the table. MAC anesthesia was administered.  The patient was prepped and draped in the usual sterile fashion.  A time out was called and antibiotics were administered.  Ultrasound was used to evaluate the veins in the left arm.  The basilic vein appeared to be adequate for fistula creation measuring approximately 3 mm.  1% lidocaine was used for local anesthesia.  An oblique incision was made just proximal to the antecubital crease.  I first dissected out the brachial artery.  This was a 3 mm disease-free artery.  I then identified the basilic vein immobilizer throughout the length of the incision.  It was marked for orientation and then divided distally.  3000 units of heparin was given.  The vein distended nicely.  After the heparin circulated the brachial artery was occluded with vascular clamps.  A #11 blade was used to make an arteriotomy which was extended longitudinally with Potts scissors.  The vein was then cut the appropriate length and spatulated to fit the size of the arteriotomy.  A running anastomosis was created with 6-0 Prolene.  Prior to completion, the appropriate flushing maneuvers were performed and the anastomosis was completed.  The vein distended  nicely.  There is a good thrill within the vein and a palpable radial pulse.  25 mg of protamine was given.  After hemostasis was satisfactory, the incision was closed with 2 layers of 3-0 Vicryl followed by Dermabond.  There were no immediate complications.   Disposition: To PACU stable   V. Annamarie Major, M.D. [facility] [place] Office: [phone] Pager:  [phone]

## 2017-04-28 ENCOUNTER — Telehealth: Payer: Self-pay | Admitting: Surgery

## 2017-04-28 NOTE — Telephone Encounter (Signed)
LVM for pt re 05/30/17 appt and 06/02/17 appt.

## 2017-04-28 NOTE — Telephone Encounter (Signed)
-----   Message from Mena Goes, RN sent at 04/24/2017 11:20 AM EST ----- Regarding: 4-6 weeks with duplex   ----- Message ----- From: Gabriel Earing, PA-C Sent: 04/24/2017  10:49 AM To: Vvs Charge Pool  S/p left 1st stage BVT.  F/u with Dr. Trula Slade in 4-6 weeks with duplex to schedule 2nd stage.  Thanks

## 2017-04-29 ENCOUNTER — Ambulatory Visit (HOSPITAL_COMMUNITY)
Admission: RE | Admit: 2017-04-29 | Discharge: 2017-04-29 | Disposition: A | Discharge status: Home / Self Care | Payer: Medicare Other | Source: Ambulatory Visit | Attending: Nephrology | Admitting: Nephrology

## 2017-04-29 ENCOUNTER — Telehealth: Payer: Self-pay | Admitting: Surgery

## 2017-04-29 VITALS — BP 188/79 | HR 64 | Temp 97.5°F | Resp 18

## 2017-04-29 DIAGNOSIS — N184 Chronic kidney disease, stage 4 (severe): Secondary | ICD-10-CM | POA: Insufficient documentation

## 2017-04-29 DIAGNOSIS — D631 Anemia in chronic kidney disease: Secondary | ICD-10-CM | POA: Diagnosis present

## 2017-04-29 DIAGNOSIS — N179 Acute kidney failure, unspecified: Secondary | ICD-10-CM

## 2017-04-29 LAB — POCT HEMOGLOBIN-HEMACUE: HEMOGLOBIN: 11.4 g/dL — AB (ref 12.0–15.0)

## 2017-04-29 LAB — IRON AND TIBC
Iron: 78 ug/dL (ref 28–170)
Saturation Ratios: 23 % (ref 10.4–31.8)
TIBC: 339 ug/dL (ref 250–450)
UIBC: 261 ug/dL

## 2017-04-29 LAB — FERRITIN: Ferritin: 114 ng/mL (ref 11–307)

## 2017-04-29 MED ORDER — DARBEPOETIN ALFA 40 MCG/0.4ML IJ SOSY
PREFILLED_SYRINGE | INTRAMUSCULAR | Status: AC
  Administered 2017-04-29: 40 ug via SUBCUTANEOUS
  Filled 2017-04-29: qty 0.4

## 2017-04-29 MED ORDER — DARBEPOETIN ALFA 40 MCG/0.4ML IJ SOSY
40.0000 ug | PREFILLED_SYRINGE | INTRAMUSCULAR | Status: DC
  Administered 2017-04-29: 40 ug via SUBCUTANEOUS

## 2017-04-29 NOTE — Telephone Encounter (Signed)
-----   Message from Mena Goes, RN sent at 04/24/2017 11:20 AM EST ----- Regarding: 4-6 weeks with duplex   ----- Message ----- From: Gabriel Earing, PA-C Sent: 04/24/2017  10:49 AM To: Vvs Charge Pool  S/p left 1st stage BVT.  F/u with Dr. Trula Slade in 4-6 weeks with duplex to schedule 2nd stage.  Thanks

## 2017-04-29 NOTE — Telephone Encounter (Signed)
Spoke to pt re 4/12 and 4/15  appt. Also mailed letter. 04/29/17

## 2017-05-13 ENCOUNTER — Encounter (HOSPITAL_COMMUNITY): Payer: Medicare Other

## 2017-05-20 ENCOUNTER — Other Ambulatory Visit: Payer: Self-pay

## 2017-05-20 DIAGNOSIS — N185 Chronic kidney disease, stage 5: Secondary | ICD-10-CM

## 2017-05-27 ENCOUNTER — Ambulatory Visit (HOSPITAL_COMMUNITY)
Admission: RE | Admit: 2017-05-27 | Discharge: 2017-05-27 | Disposition: A | Discharge status: Home / Self Care | Payer: Medicare Other | Source: Ambulatory Visit | Attending: Nephrology | Admitting: Nephrology

## 2017-05-27 VITALS — BP 181/94 | HR 67 | Temp 97.9°F | Resp 20

## 2017-05-27 DIAGNOSIS — N184 Chronic kidney disease, stage 4 (severe): Secondary | ICD-10-CM | POA: Diagnosis present

## 2017-05-27 DIAGNOSIS — N179 Acute kidney failure, unspecified: Secondary | ICD-10-CM

## 2017-05-27 DIAGNOSIS — D631 Anemia in chronic kidney disease: Secondary | ICD-10-CM | POA: Insufficient documentation

## 2017-05-27 LAB — COMPREHENSIVE METABOLIC PANEL
ALBUMIN: 3.4 g/dL — AB (ref 3.5–5.0)
ALT: 14 U/L (ref 14–54)
AST: 18 U/L (ref 15–41)
Alkaline Phosphatase: 35 U/L — ABNORMAL LOW (ref 38–126)
Anion gap: 9 (ref 5–15)
BILIRUBIN TOTAL: 0.6 mg/dL (ref 0.3–1.2)
BUN: 30 mg/dL — AB (ref 6–20)
CHLORIDE: 107 mmol/L (ref 101–111)
CO2: 25 mmol/L (ref 22–32)
CREATININE: 2.88 mg/dL — AB (ref 0.44–1.00)
Calcium: 9.5 mg/dL (ref 8.9–10.3)
GFR calc Af Amer: 17 mL/min — ABNORMAL LOW (ref >60)
GFR, EST NON AFRICAN AMERICAN: 15 mL/min — AB (ref >60)
GLUCOSE: 81 mg/dL (ref 65–99)
Potassium: 4 mmol/L (ref 3.5–5.1)
Sodium: 141 mmol/L (ref 135–145)
TOTAL PROTEIN: 6 g/dL — AB (ref 6.5–8.1)

## 2017-05-27 LAB — CBC
HEMATOCRIT: 36.6 % (ref 36.0–46.0)
Hemoglobin: 11.3 g/dL — ABNORMAL LOW (ref 12.0–15.0)
MCH: 28.4 pg (ref 26.0–34.0)
MCHC: 30.9 g/dL (ref 30.0–36.0)
MCV: 92 fL (ref 78.0–100.0)
Platelets: 205 10*3/uL (ref 150–400)
RBC: 3.98 MIL/uL (ref 3.87–5.11)
RDW: 14.5 % (ref 11.5–15.5)
WBC: 5.2 10*3/uL (ref 4.0–10.5)

## 2017-05-27 LAB — PHOSPHORUS: PHOSPHORUS: 4.5 mg/dL (ref 2.5–4.6)

## 2017-05-27 MED ORDER — DARBEPOETIN ALFA 40 MCG/0.4ML IJ SOSY
PREFILLED_SYRINGE | INTRAMUSCULAR | Status: AC
  Administered 2017-05-27: 40 ug via SUBCUTANEOUS
  Filled 2017-05-27: qty 0.4

## 2017-05-27 MED ORDER — DARBEPOETIN ALFA 40 MCG/0.4ML IJ SOSY
40.0000 ug | PREFILLED_SYRINGE | INTRAMUSCULAR | Status: DC
  Administered 2017-05-27: 40 ug via SUBCUTANEOUS

## 2017-05-30 ENCOUNTER — Ambulatory Visit (HOSPITAL_COMMUNITY)
Admission: RE | Admit: 2017-05-30 | Discharge: 2017-05-30 | Disposition: A | Discharge status: Home / Self Care | Payer: Medicare Other | Source: Ambulatory Visit | Attending: Surgery | Admitting: Surgery

## 2017-05-30 DIAGNOSIS — N185 Chronic kidney disease, stage 5: Secondary | ICD-10-CM | POA: Insufficient documentation

## 2017-05-30 DIAGNOSIS — I770 Arteriovenous fistula, acquired: Secondary | ICD-10-CM | POA: Insufficient documentation

## 2017-06-02 ENCOUNTER — Other Ambulatory Visit: Payer: Self-pay

## 2017-06-02 ENCOUNTER — Ambulatory Visit (INDEPENDENT_AMBULATORY_CARE_PROVIDER_SITE_OTHER): Payer: Self-pay | Admitting: Surgery

## 2017-06-02 ENCOUNTER — Encounter: Payer: Self-pay | Admitting: Surgery

## 2017-06-02 ENCOUNTER — Encounter: Payer: Self-pay | Admitting: *Deleted

## 2017-06-02 ENCOUNTER — Other Ambulatory Visit: Payer: Self-pay | Admitting: *Deleted

## 2017-06-02 VITALS — BP 170/92 | HR 84 | Temp 97.5°F | Resp 16 | Ht 64.5 in | Wt 126.0 lb

## 2017-06-02 DIAGNOSIS — N184 Chronic kidney disease, stage 4 (severe): Secondary | ICD-10-CM

## 2017-06-02 NOTE — Progress Notes (Signed)
Patient name: Kim Burns MRN: [id number] DOB: 12-20-1935 Sex: female  REASON FOR VISIT:     post op  HISTORY OF PRESENT ILLNESS:   Kim Burns is a 82 y.o. female who is here today for her first postoperative visit.  She is status post first stage left basilic vein fistula creation on 04/24/2017.  She is doing well.  The only complaint is occasional swelling in her forearm and hand and fingers.  She does not have any evidence of steal  CURRENT MEDICATIONS:    Current Outpatient Medications  Medication Sig Dispense Refill  . Artificial Tear Solution (GENTEAL TEARS OP) Place 1 drop into both eyes 2 (two) times daily as needed (dry eyes).     Marland Kitchen azelastine (ASTELIN) 0.1 % nasal spray Place 2 sprays into both nostrils 2 (two) times daily as needed for rhinitis.     . calcitRIOL (ROCALTROL) 0.25 MCG capsule Take 0.25 mcg by mouth daily.     . carboxymethylcellulose (REFRESH PLUS) 0.5 % SOLN Place 1 drop into both eyes as directed.     . cinacalcet (SENSIPAR) 30 MG tablet Take 30 mg by mouth daily with supper.    . diclofenac sodium (VOLTAREN) 1 % GEL Apply 2 g topically 4 (four) times daily as needed (for hand pain).     Marland Kitchen escitalopram (LEXAPRO) 5 MG tablet Take 5 mg by mouth at bedtime.     . fenofibrate (TRICOR) 145 MG tablet Take 72.5 mg by mouth every morning.     . ferrous sulfate 325 (65 FE) MG EC tablet Take 1 tablet (325 mg total) by mouth 2 (two) times daily. 60 tablet 0  . insulin lispro (HUMALOG) 100 UNIT/ML injection Inject 2-4 Units into the skin 3 (three) times daily before meals. Sliding scale before meals three times a day  150-200 Give 2 units 200-250 give 3 units 250-300 give 4 units     . methyldopa (ALDOMET) 250 MG tablet Take 250 mg by mouth daily.    . metoprolol succinate (TOPROL-XL) 25 MG 24 hr tablet Take 25 mg by mouth daily.  4  . ondansetron (ZOFRAN) 4 MG tablet Take 1 tablet (4 mg total) by mouth every 6 (six) hours as  needed for nausea. 20 tablet 0  . Pancrelipase, Lip-Prot-Amyl, (ZENPEP) 10000 units CPEP Take by mouth. Sliding scale:  200 - 2 units  250 - 3 units  300 - 4 units    . potassium chloride (K-DUR,KLOR-CON) 10 MEQ tablet Take 1 tablet (10 mEq total) by mouth daily. 7 tablet 0  . sodium bicarbonate 650 MG tablet Take 2 tablets (1,300 mg total) by mouth 2 (two) times daily. 60 tablet 0  . telmisartan (MICARDIS) 20 MG tablet Take 20 mg by mouth daily.    . traMADol (ULTRAM) 50 MG tablet Take 1 tablet (50 mg total) by mouth every 8 (eight) hours as needed. 8 tablet 0  . pantoprazole (PROTONIX) 40 MG tablet Take 1 tablet (40 mg total) by mouth 2 (two) times daily before a meal. (Patient taking differently: Take 40 mg by mouth daily. ) 60 tablet 0   No current facility-administered medications for this visit.     REVIEW OF SYSTEMS:   [X]  denotes positive finding, [ ]  denotes negative finding Cardiac  Comments:  Chest pain or chest pressure:    Shortness of breath upon exertion:    Short of breath when lying flat:    Irregular heart rhythm:    Constitutional  Fever or chills:      PHYSICAL EXAM:   Vitals:   06/02/17 1121  BP: (!) 170/92  Pulse: 84  Resp: 16  Temp: (!) 97.5 F (36.4 C)  TempSrc: Oral  SpO2: 96%  Weight: 126 lb (57.2 kg)  Height: 5' 4.5" (1.638 m)    GENERAL: The patient is a well-nourished female, in no acute distress. The vital signs are documented above. CARDIOVASCULAR: There is a regular rate and rhythm. PULMONARY: Non-labored respirations Palpable thrill within fistula.  Incision is well-healed  STUDIES:   Vein mapping indicates a basilic vein measuring between 0.55 and 0.69 cm.   MEDICAL ISSUES:   The patient has been scheduled for a second stage basilic vein fistula creation.  Because of her travel obligations in the next few weeks, this is been put on the schedule for May 23.  All of her questions were answered today.  Annamarie Major, MD [facility] of [facility] [phone] Pager [phone]

## 2017-06-24 ENCOUNTER — Encounter (HOSPITAL_COMMUNITY): Payer: Medicare Other

## 2017-07-01 ENCOUNTER — Ambulatory Visit (HOSPITAL_COMMUNITY)
Admission: RE | Admit: 2017-07-01 | Discharge: 2017-07-01 | Disposition: A | Discharge status: Home / Self Care | Payer: Medicare Other | Source: Ambulatory Visit | Attending: Nephrology | Admitting: Nephrology

## 2017-07-01 VITALS — BP 182/80 | HR 61 | Resp 20

## 2017-07-01 DIAGNOSIS — N179 Acute kidney failure, unspecified: Secondary | ICD-10-CM

## 2017-07-01 DIAGNOSIS — D631 Anemia in chronic kidney disease: Secondary | ICD-10-CM | POA: Insufficient documentation

## 2017-07-01 DIAGNOSIS — N184 Chronic kidney disease, stage 4 (severe): Secondary | ICD-10-CM | POA: Diagnosis present

## 2017-07-01 LAB — COMPREHENSIVE METABOLIC PANEL
ALBUMIN: 3.4 g/dL — AB (ref 3.5–5.0)
ALT: 15 U/L (ref 14–54)
ANION GAP: 6 (ref 5–15)
AST: 19 U/L (ref 15–41)
Alkaline Phosphatase: 33 U/L — ABNORMAL LOW (ref 38–126)
BUN: 45 mg/dL — ABNORMAL HIGH (ref 6–20)
CHLORIDE: 112 mmol/L — AB (ref 101–111)
CO2: 26 mmol/L (ref 22–32)
CREATININE: 3.19 mg/dL — AB (ref 0.44–1.00)
Calcium: 9.8 mg/dL (ref 8.9–10.3)
GFR calc non Af Amer: 13 mL/min — ABNORMAL LOW (ref >60)
GFR, EST AFRICAN AMERICAN: 15 mL/min — AB (ref >60)
GLUCOSE: 95 mg/dL (ref 65–99)
Potassium: 4.1 mmol/L (ref 3.5–5.1)
SODIUM: 144 mmol/L (ref 135–145)
Total Bilirubin: 0.6 mg/dL (ref 0.3–1.2)
Total Protein: 6.2 g/dL — ABNORMAL LOW (ref 6.5–8.1)

## 2017-07-01 LAB — CBC
HCT: 37.2 % (ref 36.0–46.0)
Hemoglobin: 11.8 g/dL — ABNORMAL LOW (ref 12.0–15.0)
MCH: 28.7 pg (ref 26.0–34.0)
MCHC: 31.7 g/dL (ref 30.0–36.0)
MCV: 90.5 fL (ref 78.0–100.0)
PLATELETS: 217 10*3/uL (ref 150–400)
RBC: 4.11 MIL/uL (ref 3.87–5.11)
RDW: 14.3 % (ref 11.5–15.5)
WBC: 6.8 10*3/uL (ref 4.0–10.5)

## 2017-07-01 LAB — PHOSPHORUS: Phosphorus: 5.1 mg/dL — ABNORMAL HIGH (ref 2.5–4.6)

## 2017-07-01 LAB — IRON AND TIBC
Iron: 74 ug/dL (ref 28–170)
SATURATION RATIOS: 20 % (ref 10.4–31.8)
TIBC: 364 ug/dL (ref 250–450)
UIBC: 290 ug/dL

## 2017-07-01 LAB — FERRITIN: Ferritin: 107 ng/mL (ref 11–307)

## 2017-07-01 MED ORDER — DARBEPOETIN ALFA 40 MCG/0.4ML IJ SOSY
PREFILLED_SYRINGE | INTRAMUSCULAR | Status: AC
  Administered 2017-07-01: 40 ug via SUBCUTANEOUS
  Filled 2017-07-01: qty 0.4

## 2017-07-01 MED ORDER — DARBEPOETIN ALFA 40 MCG/0.4ML IJ SOSY
40.0000 ug | PREFILLED_SYRINGE | INTRAMUSCULAR | Status: DC
  Administered 2017-07-01: 40 ug via SUBCUTANEOUS

## 2017-07-02 LAB — PTH, INTACT AND CALCIUM
CALCIUM TOTAL (PTH): 9.6 mg/dL (ref 8.7–10.3)
PTH: 123 pg/mL — ABNORMAL HIGH (ref 15–65)

## 2017-07-08 ENCOUNTER — Encounter (HOSPITAL_COMMUNITY): Payer: Self-pay | Admitting: *Deleted

## 2017-07-08 NOTE — Progress Notes (Signed)
Denies chest pain or shortness of breath. Reports having to see cardiology prior to Whipple procedure at request of Dr. Barry Dienes. Provided following instructions regarding DM.    How to Manage Your Diabetes Before and After Surgery  How do I manage my blood sugar before surgery? . Check your blood sugar at least 4 times a day, starting 2 days before surgery, to make sure that the level is not too high or low. o Check your blood sugar the morning of your surgery when you wake up and every 2 hours until you get to the Short Stay unit. . If your blood sugar is less than 70 mg/dL, you will need to treat for low blood sugar: o Do not take insulin. o Treat a low blood sugar (less than 70 mg/dL) with  cup of clear juice (cranberry or apple), 4 glucose tablets, OR glucose gel. Recheck blood sugar in 15 minutes after treatment (to make sure it is greater than 70 mg/dL). If your blood sugar is not greater than 70 mg/dL on recheck, call [phone] o  for further instructions. . Report your blood sugar to the short stay nurse when you get to Short Stay.  . If you are admitted to the hospital after surgery: o Your blood sugar will be checked by the staff and you will probably be given insulin after surgery (instead of oral diabetes medicines) to make sure you have good blood sugar levels. o The goal for blood sugar control after surgery is 80-180 mg/dL.            . If your CBG is greater than 220 mg/dL, you may take  of your sliding scale (correction) dose of insulin.  Other Instructions:          Patient Signature:  Date:   Nurse Signature:  Date:   Reviewed and Endorsed by [facility] Patient Education Committee, August 2015

## 2017-07-10 ENCOUNTER — Ambulatory Visit (HOSPITAL_COMMUNITY): Payer: Medicare Other | Admitting: Anesthesiology

## 2017-07-10 ENCOUNTER — Telehealth: Payer: Self-pay | Admitting: Surgery

## 2017-07-10 ENCOUNTER — Ambulatory Visit (HOSPITAL_COMMUNITY)
Admission: RE | Admit: 2017-07-10 | Discharge: 2017-07-10 | Disposition: A | Discharge status: Home / Self Care | Payer: Medicare Other | Source: Ambulatory Visit | Attending: Surgery | Admitting: Surgery

## 2017-07-10 ENCOUNTER — Encounter (HOSPITAL_COMMUNITY)
Admission: RE | Disposition: A | Discharge status: Home / Self Care | Payer: Self-pay | Source: Ambulatory Visit | Attending: Surgery

## 2017-07-10 ENCOUNTER — Encounter (HOSPITAL_COMMUNITY): Payer: Self-pay

## 2017-07-10 ENCOUNTER — Other Ambulatory Visit: Payer: Self-pay

## 2017-07-10 DIAGNOSIS — E1122 Type 2 diabetes mellitus with diabetic chronic kidney disease: Secondary | ICD-10-CM | POA: Insufficient documentation

## 2017-07-10 DIAGNOSIS — K219 Gastro-esophageal reflux disease without esophagitis: Secondary | ICD-10-CM | POA: Diagnosis not present

## 2017-07-10 DIAGNOSIS — N186 End stage renal disease: Secondary | ICD-10-CM | POA: Insufficient documentation

## 2017-07-10 DIAGNOSIS — Z79899 Other long term (current) drug therapy: Secondary | ICD-10-CM | POA: Diagnosis not present

## 2017-07-10 DIAGNOSIS — G4730 Sleep apnea, unspecified: Secondary | ICD-10-CM | POA: Insufficient documentation

## 2017-07-10 DIAGNOSIS — Z794 Long term (current) use of insulin: Secondary | ICD-10-CM | POA: Insufficient documentation

## 2017-07-10 DIAGNOSIS — I120 Hypertensive chronic kidney disease with stage 5 chronic kidney disease or end stage renal disease: Secondary | ICD-10-CM | POA: Diagnosis present

## 2017-07-10 DIAGNOSIS — N185 Chronic kidney disease, stage 5: Secondary | ICD-10-CM | POA: Diagnosis not present

## 2017-07-10 DIAGNOSIS — D631 Anemia in chronic kidney disease: Secondary | ICD-10-CM | POA: Diagnosis not present

## 2017-07-10 HISTORY — DX: Anxiety disorder, unspecified: F41.9

## 2017-07-10 HISTORY — PX: BASCILIC VEIN TRANSPOSITION: SHX5742

## 2017-07-10 LAB — GLUCOSE, CAPILLARY
GLUCOSE-CAPILLARY: 107 mg/dL — AB (ref 65–99)
Glucose-Capillary: 81 mg/dL (ref 65–99)

## 2017-07-10 LAB — POCT I-STAT 4, (NA,K, GLUC, HGB,HCT)
Glucose, Bld: 96 mg/dL (ref 65–99)
HEMATOCRIT: 36 % (ref 36.0–46.0)
HEMOGLOBIN: 12.2 g/dL (ref 12.0–15.0)
Potassium: 4.3 mmol/L (ref 3.5–5.1)
SODIUM: 145 mmol/L (ref 135–145)

## 2017-07-10 SURGERY — TRANSPOSITION, VEIN, BASILIC
Anesthesia: General | Site: Arm Upper | Laterality: Left

## 2017-07-10 MED ORDER — HEPARIN SODIUM (PORCINE) 5000 UNIT/ML IJ SOLN
INTRAMUSCULAR | Status: DC | PRN
  Administered 2017-07-10: 10:00:00

## 2017-07-10 MED ORDER — HEPARIN SODIUM (PORCINE) 1000 UNIT/ML IJ SOLN
INTRAMUSCULAR | Status: DC | PRN
  Administered 2017-07-10: 3000 [IU] via INTRAVENOUS

## 2017-07-10 MED ORDER — PROMETHAZINE HCL 25 MG/ML IJ SOLN: 6.2500 mg | INTRAMUSCULAR | Status: DC | PRN

## 2017-07-10 MED ORDER — ONDANSETRON HCL 4 MG/2ML IJ SOLN
INTRAMUSCULAR | Status: DC | PRN
  Administered 2017-07-10: 4 mg via INTRAVENOUS

## 2017-07-10 MED ORDER — DEXAMETHASONE SODIUM PHOSPHATE 10 MG/ML IJ SOLN
INTRAMUSCULAR | Status: DC | PRN
  Administered 2017-07-10: 10 mg via INTRAVENOUS

## 2017-07-10 MED ORDER — FENTANYL CITRATE (PF) 250 MCG/5ML IJ SOLN
INTRAMUSCULAR | Status: AC
  Filled 2017-07-10: qty 5

## 2017-07-10 MED ORDER — OXYCODONE HCL 5 MG PO TABS: 5.0000 mg | ORAL_TABLET | Freq: Four times a day (QID) | ORAL | 0 refills | Status: DC | PRN

## 2017-07-10 MED ORDER — FENTANYL CITRATE (PF) 100 MCG/2ML IJ SOLN: 25.0000 ug | INTRAMUSCULAR | Status: DC | PRN

## 2017-07-10 MED ORDER — PROPOFOL 10 MG/ML IV BOLUS
INTRAVENOUS | Status: DC | PRN
  Administered 2017-07-10: 150 mg via INTRAVENOUS

## 2017-07-10 MED ORDER — SODIUM CHLORIDE 0.9 % IV SOLN
INTRAVENOUS | Status: DC
  Administered 2017-07-10 (×3): via INTRAVENOUS

## 2017-07-10 MED ORDER — PHENYLEPHRINE HCL 10 MG/ML IJ SOLN
INTRAVENOUS | Status: DC | PRN
  Administered 2017-07-10: 25 ug/min via INTRAVENOUS

## 2017-07-10 MED ORDER — LIDOCAINE HCL (CARDIAC) PF 100 MG/5ML IV SOSY
PREFILLED_SYRINGE | INTRAVENOUS | Status: DC | PRN
  Administered 2017-07-10: 60 mg via INTRAVENOUS

## 2017-07-10 MED ORDER — FENTANYL CITRATE (PF) 100 MCG/2ML IJ SOLN
INTRAMUSCULAR | Status: DC | PRN
  Administered 2017-07-10: 25 ug via INTRAVENOUS
  Administered 2017-07-10: 50 ug via INTRAVENOUS

## 2017-07-10 MED ORDER — 0.9 % SODIUM CHLORIDE (POUR BTL) OPTIME
TOPICAL | Status: DC | PRN
  Administered 2017-07-10: 1000 mL

## 2017-07-10 MED ORDER — CEFAZOLIN SODIUM-DEXTROSE 2-4 GM/100ML-% IV SOLN
2.0000 g | INTRAVENOUS | Status: AC
  Administered 2017-07-10: 2 g via INTRAVENOUS
  Filled 2017-07-10: qty 100

## 2017-07-10 MED ORDER — MIDAZOLAM HCL 2 MG/2ML IJ SOLN: 0.5000 mg | Freq: Once | INTRAMUSCULAR | Status: DC | PRN

## 2017-07-10 MED ORDER — PROTAMINE SULFATE 10 MG/ML IV SOLN
INTRAVENOUS | Status: DC | PRN
  Administered 2017-07-10 (×3): 10 mg via INTRAVENOUS
  Administered 2017-07-10: 20 mg via INTRAVENOUS

## 2017-07-10 MED ORDER — CHLORHEXIDINE GLUCONATE 4 % EX LIQD: 60.0000 mL | Freq: Once | CUTANEOUS | Status: DC

## 2017-07-10 MED ORDER — GLYCOPYRROLATE 0.2 MG/ML IJ SOLN
INTRAMUSCULAR | Status: DC | PRN
  Administered 2017-07-10: 0.2 mg via INTRAVENOUS

## 2017-07-10 MED ORDER — SODIUM CHLORIDE 0.9 % IV SOLN
INTRAVENOUS | Status: AC
  Filled 2017-07-10: qty 1.2

## 2017-07-10 MED ORDER — HEMOSTATIC AGENTS (NO CHARGE) OPTIME
TOPICAL | Status: DC | PRN
  Administered 2017-07-10: 1 via TOPICAL

## 2017-07-10 MED ORDER — MEPERIDINE HCL 50 MG/ML IJ SOLN: 6.2500 mg | INTRAMUSCULAR | Status: DC | PRN

## 2017-07-10 MED ORDER — PROPOFOL 10 MG/ML IV BOLUS
INTRAVENOUS | Status: AC
  Filled 2017-07-10: qty 20

## 2017-07-10 SURGICAL SUPPLY — 37 items
ARMBAND PINK RESTRICT EXTREMIT (MISCELLANEOUS) ×2 IMPLANT
CANISTER SUCT 3000ML PPV (MISCELLANEOUS) ×2 IMPLANT
CLIP VESOCCLUDE MED 6/CT (CLIP) ×2 IMPLANT
CLIP VESOCCLUDE SM WIDE 6/CT (CLIP) ×2 IMPLANT
COVER PROBE W GEL 5X96 (DRAPES) ×2 IMPLANT
DERMABOND ADVANCED (GAUZE/BANDAGES/DRESSINGS) ×1
DERMABOND ADVANCED .7 DNX12 (GAUZE/BANDAGES/DRESSINGS) ×1 IMPLANT
ELECT REM PT RETURN 9FT ADLT (ELECTROSURGICAL) ×2
ELECTRODE REM PT RTRN 9FT ADLT (ELECTROSURGICAL) ×1 IMPLANT
GLOVE BIO SURGEON STRL SZ 6.5 (GLOVE) ×4 IMPLANT
GLOVE BIOGEL PI IND STRL 6.5 (GLOVE) ×3 IMPLANT
GLOVE BIOGEL PI IND STRL 7.0 (GLOVE) ×1 IMPLANT
GLOVE BIOGEL PI IND STRL 7.5 (GLOVE) ×2 IMPLANT
GLOVE BIOGEL PI INDICATOR 6.5 (GLOVE) ×3
GLOVE BIOGEL PI INDICATOR 7.0 (GLOVE) ×1
GLOVE BIOGEL PI INDICATOR 7.5 (GLOVE) ×2
GLOVE SURG SS PI 6.0 STRL IVOR (GLOVE) ×2 IMPLANT
GLOVE SURG SS PI 7.5 STRL IVOR (GLOVE) ×2 IMPLANT
GOWN STRL REUS W/ TWL LRG LVL3 (GOWN DISPOSABLE) ×2 IMPLANT
GOWN STRL REUS W/ TWL XL LVL3 (GOWN DISPOSABLE) ×2 IMPLANT
GOWN STRL REUS W/TWL LRG LVL3 (GOWN DISPOSABLE) ×2
GOWN STRL REUS W/TWL XL LVL3 (GOWN DISPOSABLE) ×2
HEMOSTAT SNOW SURGICEL 2X4 (HEMOSTASIS) ×2 IMPLANT
KIT BASIN OR (CUSTOM PROCEDURE TRAY) ×2 IMPLANT
KIT TURNOVER KIT B (KITS) ×2 IMPLANT
NS IRRIG 1000ML POUR BTL (IV SOLUTION) ×2 IMPLANT
PACK CV ACCESS (CUSTOM PROCEDURE TRAY) ×2 IMPLANT
PAD ARMBOARD 7.5X6 YLW CONV (MISCELLANEOUS) ×4 IMPLANT
SUT PROLENE 5 0 C 1 24 (SUTURE) ×2 IMPLANT
SUT PROLENE 6 0 CC (SUTURE) ×2 IMPLANT
SUT SILK 2 0 SH (SUTURE) ×2 IMPLANT
SUT VIC AB 3-0 SH 27 (SUTURE) ×3
SUT VIC AB 3-0 SH 27X BRD (SUTURE) ×3 IMPLANT
SUT VICRYL 4-0 PS2 18IN ABS (SUTURE) ×4 IMPLANT
TOWEL GREEN STERILE (TOWEL DISPOSABLE) ×2 IMPLANT
UNDERPAD 30X30 (UNDERPADS AND DIAPERS) ×2 IMPLANT
WATER STERILE IRR 1000ML POUR (IV SOLUTION) ×2 IMPLANT

## 2017-07-10 NOTE — H&P (Signed)
Patient name: Kim Burns       MRN: [id number]        DOB: 05-Jan-1936          Sex: female  REASON FOR VISIT:     post op  HISTORY OF PRESENT ILLNESS:   Kim Burns is a 82 y.o. female who is here today for her first postoperative visit.  She is status post first stage left basilic vein fistula creation on 04/24/2017.  She is doing well.  The only complaint is occasional swelling in her forearm and hand and fingers.  She does not have any evidence of steal  CURRENT MEDICATIONS:          Current Outpatient Medications  Medication Sig Dispense Refill  . Artificial Tear Solution (GENTEAL TEARS OP) Place 1 drop into both eyes 2 (two) times daily as needed (dry eyes).     Marland Kitchen azelastine (ASTELIN) 0.1 % nasal spray Place 2 sprays into both nostrils 2 (two) times daily as needed for rhinitis.     . calcitRIOL (ROCALTROL) 0.25 MCG capsule Take 0.25 mcg by mouth daily.     . carboxymethylcellulose (REFRESH PLUS) 0.5 % SOLN Place 1 drop into both eyes as directed.     . cinacalcet (SENSIPAR) 30 MG tablet Take 30 mg by mouth daily with supper.    . diclofenac sodium (VOLTAREN) 1 % GEL Apply 2 g topically 4 (four) times daily as needed (for hand pain).     Marland Kitchen escitalopram (LEXAPRO) 5 MG tablet Take 5 mg by mouth at bedtime.     . fenofibrate (TRICOR) 145 MG tablet Take 72.5 mg by mouth every morning.     . ferrous sulfate 325 (65 FE) MG EC tablet Take 1 tablet (325 mg total) by mouth 2 (two) times daily. 60 tablet 0  . insulin lispro (HUMALOG) 100 UNIT/ML injection Inject 2-4 Units into the skin 3 (three) times daily before meals. Sliding scale before meals three times a day  150-200 Give 2 units 200-250 give 3 units 250-300 give 4 units     . methyldopa (ALDOMET) 250 MG tablet Take 250 mg by mouth daily.    . metoprolol succinate (TOPROL-XL) 25 MG 24 hr tablet Take 25 mg by mouth daily.  4  . ondansetron (ZOFRAN) 4 MG tablet Take 1 tablet (4  mg total) by mouth every 6 (six) hours as needed for nausea. 20 tablet 0  . Pancrelipase, Lip-Prot-Amyl, (ZENPEP) 10000 units CPEP Take by mouth. Sliding scale:  200 - 2 units  250 - 3 units  300 - 4 units    . potassium chloride (K-DUR,KLOR-CON) 10 MEQ tablet Take 1 tablet (10 mEq total) by mouth daily. 7 tablet 0  . sodium bicarbonate 650 MG tablet Take 2 tablets (1,300 mg total) by mouth 2 (two) times daily. 60 tablet 0  . telmisartan (MICARDIS) 20 MG tablet Take 20 mg by mouth daily.    . traMADol (ULTRAM) 50 MG tablet Take 1 tablet (50 mg total) by mouth every 8 (eight) hours as needed. 8 tablet 0  . pantoprazole (PROTONIX) 40 MG tablet Take 1 tablet (40 mg total) by mouth 2 (two) times daily before a meal. (Patient taking differently: Take 40 mg by mouth daily. ) 60 tablet 0   No current facility-administered medications for this visit.     REVIEW OF SYSTEMS:   [X]  denotes  positive finding, [ ]  denotes negative finding Cardiac  Comments:  Chest pain or chest pressure:    Shortness of breath upon exertion:    Short of breath when lying flat:    Irregular heart rhythm:    Constitutional    Fever or chills:      PHYSICAL EXAM:      Vitals:   06/02/17 1121  BP: (!) 170/92  Pulse: 84  Resp: 16  Temp: (!) 97.5 F (36.4 C)  TempSrc: Oral  SpO2: 96%  Weight: 126 lb (57.2 kg)  Height: 5' 4.5" (1.638 m)    GENERAL: The patient is a well-nourished female, in no acute distress. The vital signs are documented above. CARDIOVASCULAR: There is a regular rate and rhythm. PULMONARY: Non-labored respirations Palpable thrill within fistula.  Incision is well-healed  STUDIES:   Vein mapping indicates a basilic vein measuring between 0.55 and 0.69 cm.   MEDICAL ISSUES:   The patient has been scheduled for a second stage basilic vein fistula creation.  Because of her travel obligations in the next few weeks, this is been put on the schedule for May  23.  All of her questions were answered today.  Annamarie Major, MD [facility] [phone] Pager [phone]    Plan 2nd stage BVT. No interval changes  Wells Adonay Scheier

## 2017-07-10 NOTE — Discharge Instructions (Signed)

## 2017-07-10 NOTE — Telephone Encounter (Signed)
sch appt 08/04/17 LC 1pm P/O PA s/p L Arm 2nd stage BVT

## 2017-07-10 NOTE — Anesthesia Postprocedure Evaluation (Signed)
Anesthesia Post Note  Patient: Kim Burns  Procedure(s) Performed: SECOND STAGE BASILIC VEIN TRANSPOSITION LEFT ARM (Left Arm Upper)     Patient location during evaluation: PACU Anesthesia Type: General Level of consciousness: awake Pain management: pain level controlled Vital Signs Assessment: post-procedure vital signs reviewed and stable Respiratory status: spontaneous breathing Cardiovascular status: stable Anesthetic complications: no    Last Vitals:  Vitals:   07/10/17 1157 07/10/17 1211  BP: (!) 164/78 (!) 152/71  Pulse:  (!) 56  Resp: 13 13  Temp:    SpO2:  93%    Last Pain:  Vitals:   07/10/17 1211  TempSrc:   PainSc: 0-No pain                 Gared Gillie

## 2017-07-10 NOTE — Anesthesia Procedure Notes (Signed)
Procedure Name: LMA Insertion Date/Time: 07/10/2017 9:17 AM Performed by: Neldon Newport, CRNA Pre-anesthesia Checklist: Timeout performed, Patient being monitored, Suction available, Emergency Drugs available and Patient identified Patient Re-evaluated:Patient Re-evaluated prior to induction Oxygen Delivery Method: Circle system utilized Preoxygenation: Pre-oxygenation with 100% oxygen Induction Type: IV induction Ventilation: Mask ventilation without difficulty LMA: LMA inserted LMA Size: 4.0 Number of attempts: 1 Placement Confirmation: breath sounds checked- equal and bilateral and positive ETCO2 Tube secured with: Tape Dental Injury: Teeth and Oropharynx as per pre-operative assessment

## 2017-07-10 NOTE — Anesthesia Preprocedure Evaluation (Addendum)
Anesthesia Evaluation  Patient identified by MRN, date of birth, ID band Patient awake    History of Anesthesia Complications (+) PONV  Airway Mallampati: II  TM Distance: >3 FB     Dental   Pulmonary sleep apnea ,    breath sounds clear to auscultation       Cardiovascular hypertension, + dysrhythmias  Rhythm:Regular Rate:Normal     Neuro/Psych  Headaches,    GI/Hepatic PUD, GERD  ,  Endo/Other  diabetes  Renal/GU Renal disease     Musculoskeletal   Abdominal   Peds  Hematology  (+) anemia ,   Anesthesia Other Findings   Reproductive/Obstetrics                             Anesthesia Physical Anesthesia Plan  ASA: III  Anesthesia Plan: General   Post-op Pain Management:    Induction: Intravenous  PONV Risk Score and Plan: 4 or greater and Treatment may vary due to age or medical condition  Airway Management Planned: LMA  Additional Equipment:   Intra-op Plan:   Post-operative Plan: Extubation in OR  Informed Consent: I have reviewed the patients History and Physical, chart, labs and discussed the procedure including the risks, benefits and alternatives for the proposed anesthesia with the patient or authorized representative who has indicated his/her understanding and acceptance.   Dental advisory given  Plan Discussed with: CRNA and Anesthesiologist  Anesthesia Plan Comments:         Anesthesia Quick Evaluation

## 2017-07-10 NOTE — Telephone Encounter (Signed)
-----   Message from Penni Homans, RN sent at 07/10/2017 11:47 AM EDT ----- Regarding: Appointment   ----- Message ----- From: Iline Oven Sent: 07/10/2017  11:31 AM To: Vvs Charge Pool  Can you schedule this pt for appt on PA clinic on Dr. Trula Slade office day in 3-4 weeks.  PO L arm 2nd stage basilic trans. Thanks, MAtt

## 2017-07-10 NOTE — Transfer of Care (Signed)
Immediate Anesthesia Transfer of Care Note  Patient: Kim Burns  Procedure(s) Performed: SECOND STAGE BASILIC VEIN TRANSPOSITION LEFT ARM (Left Arm Upper)  Patient Location: PACU  Anesthesia Type:General  Level of Consciousness: awake, alert  and oriented  Airway & Oxygen Therapy: Patient Spontanous Breathing and Patient connected to nasal cannula oxygen  Post-op Assessment: Report given to RN, Post -op Vital signs reviewed and stable and Patient moving all extremities X 4  Post vital signs: Reviewed and stable  Last Vitals:  Vitals Value Taken Time  BP 165/76 07/10/2017 11:42 AM  Temp    Pulse 57 07/10/2017 11:41 AM  Resp 14 07/10/2017 11:42 AM  SpO2 98 % 07/10/2017 11:41 AM  Vitals shown include unvalidated device data.  Last Pain:  Vitals:   07/10/17 0744  TempSrc:   PainSc: 0-No pain         Complications: No apparent anesthesia complications

## 2017-07-10 NOTE — Anesthesia Postprocedure Evaluation (Signed)
Anesthesia Post Note  Patient: Kim Burns  Procedure(s) Performed: SECOND STAGE BASILIC VEIN TRANSPOSITION LEFT ARM (Left Arm Upper)     Patient location during evaluation: PACU Anesthesia Type: General Level of consciousness: awake Pain management: pain level controlled Vital Signs Assessment: post-procedure vital signs reviewed and stable Respiratory status: spontaneous breathing Cardiovascular status: stable Anesthetic complications: no    Last Vitals:  Vitals:   07/10/17 1157 07/10/17 1211  BP: (!) 164/78 (!) 152/71  Pulse:  (!) 56  Resp: 13 13  Temp:    SpO2:  93%    Last Pain:  Vitals:   07/10/17 1211  TempSrc:   PainSc: 0-No pain                 Izayiah Tibbitts

## 2017-07-11 ENCOUNTER — Encounter (HOSPITAL_COMMUNITY): Payer: Self-pay | Admitting: Surgery

## 2017-07-11 NOTE — Op Note (Signed)
    Patient name: Kim Burns MRN: [id number] DOB: 08-19-1935 Sex: female  07/10/2017 Pre-operative Diagnosis: ESRD Post-operative diagnosis:  Same Surgeon:  Annamarie Major Assistants:  Arlee Muslim Procedure:   2nd stage left basilic vein fistula Anesthesia:  General Blood Loss:  minimal Specimens:  none  Findings: The basilic vein was occluded in the proximal upper arm.  There was a large branch connecting it to the deep system.  There was only a single brachial vein, and so I made a short loop for the fistula and plugged it in in a end-to-side fashion to the brachial vein  Indications: The patient comes in today for second stage basilic vein fistula.  Procedure:  The patient was identified in the holding area and taken to [place] 11  The patient was then placed supine on the table. general anesthesia was administered.  The patient was prepped and draped in the usual sterile fashion.  My PA was necessary for assistance with this procedure.  A time out was called and antibiotics were administered.  Ultrasound was used to evaluate the basilic vein.  It appeared to occlude in the mid upper arm.  It did appear to join the deep system.  I made 2 longitudinal incisions in the upper arm.  I first began in the incision near the antecubital crease and identified the basilic vein.  It was circumferentially mobilized.  Side branches divided between silk ties.  The nerve was protected.  Once I got to the second incision, it was apparent that the vein was occluded in the mid arm.  Under the skin bridge, there was a large branch connecting the vein to the deep system.  In the more proximal incision, I dissected out the brachial vein.  This was a single brachial vein.  There were multiple branches and I did not think mobilizing the brachial vein would be a good idea and so I elected to create a smaller loop for her fistula.  I marked it with an ink pen for orientation.  I then divided the vein right at the  level of the occlusion.  I then made a curved subcutaneous tunnel and brought the vein through the tunnel.  I then occluded the brachial vein and performed a end-to-side anastomosis.  This was done with 6-0 Prolene.  Once it was completed the clamps were released.  There is a good thrill within the fistula.  The fistula vein measured about 7 mm.  Hemostasis was then achieved and the incisions were closed with 2 layers of 3-0 Vicryl followed by Dermabond.  There were no immediate complications.   Disposition: To PACU stable   V. Annamarie Major, M.D. [facility] [place] Office: [phone] Pager:  [phone]

## 2017-07-22 ENCOUNTER — Encounter: Payer: Self-pay | Admitting: Family

## 2017-07-22 ENCOUNTER — Ambulatory Visit (INDEPENDENT_AMBULATORY_CARE_PROVIDER_SITE_OTHER): Payer: Medicare Other | Admitting: Family

## 2017-07-22 ENCOUNTER — Telehealth: Payer: Self-pay | Admitting: *Deleted

## 2017-07-22 VITALS — BP 174/78 | HR 58 | Temp 98.6°F | Ht 64.5 in | Wt 127.3 lb

## 2017-07-22 DIAGNOSIS — Z4889 Encounter for other specified surgical aftercare: Secondary | ICD-10-CM

## 2017-07-22 DIAGNOSIS — I770 Arteriovenous fistula, acquired: Secondary | ICD-10-CM

## 2017-07-22 DIAGNOSIS — N185 Chronic kidney disease, stage 5: Secondary | ICD-10-CM

## 2017-07-22 NOTE — Progress Notes (Signed)
Postoperative Access Visit   History of Present Illness  Kim Burns is a 82 y.o. year old female who is status post first stage left basilic vein fistula creation on 04/24/2017 by Dr. Trula Slade.  She is then s/p 2nd stage left basilic vein fistula on 03-19-84 by Dr. Trula Slade.   She returns today with c/o "swelling and shooting pain in left arm" s/p second stage BVT. She claims she has been elevating arm and exercising hand. Denies any increase pain with gripping, or any signs of infection. [facility] nurse and PCP have seen and recommended check here.  She denies feeling fever, states "I've always been on the cold side".  She states she has full use of both hands.  The shooting pain is occasional, and travels from upper left arm to wrist.   She has not yet started hemodialysis, and indicates that her nephrologist wants to have this AV fistula ready when she needs it.  GFR was 13 on 07-01-17.   She has a post op appointment on 08-04-17 with a PA.   Left arm incisions are healing well. The patient notes no steal symptoms.  The patient is able to complete her activities of daily living.     For VQI Use Only  PRE-ADM LIVING: Home  AMB STATUS: Ambulatory    Past Medical History:  Diagnosis Date  . Anemia of chronic disease 1985   hysterectomy fibroid tumors  . Anxiety   . Bleeding ulcer   . Blood transfusion without reported diagnosis   . Bradycardia   . CKD (chronic kidney disease) stage 3, GFR 30-59 ml/min [facility])    sees dr Arty Baumgartner sees next in jan 2018  . Diabetes mellitus without complication ([place])    became diabetic after Whipple procedure  . Diarrhea   . Dysrhythmia 04/16/2016   bradycardia due to medication   . GERD (gastroesophageal reflux disease)   . Gout   . Headache   . History of kidney stones 06/2013  . Hyperparathyroidism ([place])   . Hypertension   . Itching    last year  . PONV (postoperative nausea and vomiting)   . Sleep apnea    no cpap machine. could  not tolerate  . Stroke ([place]) 04/16/2016   TIA 1995  . TIA (transient ischemic attack) 1996   x 1  . Vitamin D deficiency     Past Surgical History:  Procedure Laterality Date  . ABDOMINAL HYSTERECTOMY     complete  . APPENDECTOMY     with gallbladder  . BACK SURGERY     lower  . BASCILIC VEIN TRANSPOSITION Left 04/24/2017   Procedure: LEFT ARM FIRST STAGE BASILIC VEIN TRANSPOSITION;  Surgeon: Serafina Mitchell, MD;  Location: [place];  Service: Vascular;  Laterality: Left;  . BASCILIC VEIN TRANSPOSITION Left 07/10/2017   Procedure: SECOND STAGE BASILIC VEIN TRANSPOSITION LEFT ARM;  Surgeon: Serafina Mitchell, MD;  Location: [facility];  Service: Vascular;  Laterality: Left;  . BREAST LUMPECTOMY Left x 2   many years apart, benign  . CHOLECYSTECTOMY    . CYSTOSCOPY WITH URETEROSCOPY AND STENT PLACEMENT Left 06/18/2013   Procedure: CYSTOSCOPY WITH Lef URETEROSCOPY AND Left STENT PLACEMENT;  Surgeon: Dutch Gray, MD;  Location: [facility];  Service: Urology;  Laterality: Left;  . ESOPHAGOGASTRODUODENOSCOPY (EGD) WITH PROPOFOL N/A 11/13/2016   Procedure: ESOPHAGOGASTRODUODENOSCOPY (EGD) WITH PROPOFOL;  Surgeon: Otis Brace, MD;  Location: [facility];  Service: Gastroenterology;  Laterality: N/A;  . EUS N/A 02/07/2016  Procedure: ESOPHAGEAL ENDOSCOPIC ULTRASOUND (EUS) RADIAL;  Surgeon: Arta Silence, MD;  Location: [facility];  Service: Endoscopy;  Laterality: N/A;  . EYE SURGERY Bilateral 2014   ioc for catracts   . FOOT SURGERY     something with toes unsure what   . HERNIA REPAIR    . PARATHYROID EXPLORATION    . WHIPPLE PROCEDURE N/A 04/23/2016   Procedure: WHIPPLE PROCEDURE;  Surgeon: Stark Klein, MD;  Location: [facility];  Service: General;  Laterality: N/A;    Family History  Problem Relation Age of Onset  . Heart disease Mother   . Stroke Mother   . Cancer Father        colon  . Alzheimer's disease Sister   . Cancer Brother        brain  . Alzheimer's disease Sister      Social History   Socioeconomic History  . Marital status: Widowed    Spouse name: Not on file  . Number of children: Not on file  . Years of education: Not on file  . Highest education level: Not on file  Occupational History  . Not on file  Social Needs  . Financial resource strain: Not on file  . Food insecurity:    Worry: Not on file    Inability: Not on file  . Transportation needs:    Medical: Not on file    Non-medical: Not on file  Tobacco Use  . Smoking status: Never Smoker  . Smokeless tobacco: Never Used  Substance and Sexual Activity  . Alcohol use: No  . Drug use: No  . Sexual activity: Not on file  Lifestyle  . Physical activity:    Days per week: Not on file    Minutes per session: Not on file  . Stress: Not on file  Relationships  . Social connections:    Talks on phone: Not on file    Gets together: Not on file    Attends religious service: Not on file    Active member of club or organization: Not on file    Attends meetings of clubs or organizations: Not on file    Relationship status: Not on file  . Intimate partner violence:    Fear of current or ex partner: Not on file    Emotionally abused: Not on file    Physically abused: Not on file    Forced sexual activity: Not on file  Other Topics Concern  . Not on file  Social History Narrative  . Not on file    Allergies  Allergen Reactions  . Gemfibrozil Other (See Comments)    Myalgia   . Lotemax [Loteprednol Etabonate] Other (See Comments)    burning  . Statins Other (See Comments)    Muscle weakness  . Doxycycline Other (See Comments)    UNSPECIFIED REACTION   . Codeine Other (See Comments)    headache  . Hydrocodone-Acetaminophen Other (See Comments)    Headache. Tolerates acetaminophen.    Current Outpatient Medications on File Prior to Visit  Medication Sig Dispense Refill  . azelastine (ASTELIN) 0.1 % nasal spray Place 2 sprays into both nostrils 2 (two) times daily as  needed for rhinitis.     . calcitRIOL (ROCALTROL) 0.25 MCG capsule Take 0.25 mcg by mouth daily.     . diclofenac sodium (VOLTAREN) 1 % GEL Apply 2 g topically 4 (four) times daily as needed (for hand pain).     Marland Kitchen escitalopram (LEXAPRO) 5 MG tablet  Take 5 mg by mouth 3 (three) times daily.     . fenofibrate (TRICOR) 145 MG tablet Take 72.5 mg by mouth every morning.     . ferrous sulfate 325 (65 FE) MG EC tablet Take 1 tablet (325 mg total) by mouth 2 (two) times daily. 60 tablet 0  . insulin lispro (HUMALOG) 100 UNIT/ML injection Inject 2-4 Units into the skin 3 (three) times daily before meals. Sliding scale before meals three times a day  150-200 Give 2 units 200-250 give 3 units 250-300 give 4 units     . lipase/protease/amylase (CREON) 36000 UNITS CPEP capsule Take 36,000-72,000 Units by mouth See admin instructions. Take 72000 units by mouth 3 times daily with meals and take 36000 units by mouth with snacks    . metoprolol succinate (TOPROL-XL) 25 MG 24 hr tablet Take 25 mg by mouth daily.  4  . potassium chloride (K-DUR,KLOR-CON) 10 MEQ tablet Take 1 tablet (10 mEq total) by mouth daily. 7 tablet 0  . Propylene Glycol (SYSTANE COMPLETE) 0.6 % SOLN Place 1 drop into both eyes 4 (four) times daily as needed (for dry eyes).    . sodium bicarbonate 650 MG tablet Take 2 tablets (1,300 mg total) by mouth 2 (two) times daily. 60 tablet 0  . telmisartan (MICARDIS) 20 MG tablet Take 20 mg by mouth daily.    Marland Kitchen telmisartan-hydrochlorothiazide (MICARDIS HCT) 80-25 MG tablet Take 1 tablet by mouth daily.  0  . pantoprazole (PROTONIX) 40 MG tablet Take 1 tablet (40 mg total) by mouth 2 (two) times daily before a meal. (Patient taking differently: Take 40 mg by mouth daily. ) 60 tablet 0   No current facility-administered medications on file prior to visit.     Left upper arm AV fistula     Physical Examination Vitals:   07/22/17 1547  BP: (!) 174/78  Pulse: (!) 58  Temp: 98.6 F (37 C)   TempSrc: Oral  SpO2: 100%  Weight: 127 lb 4.8 oz (57.7 kg)  Height: 5' 4.5" (1.638 m)   Body mass index is 21.51 kg/m.   Bilateral radial pulses are 2+ palpable. Left upper arm incision is healing well, skin feels warm and nornal, hand grip is 5/5, sensation in digits is intact, palpable thrill, bruit can be auscultated  No signs of infection at the incisions.   Medical Decision Making  Kim Burns is a 82 y.o. year old female who presents s/p 2nd stage left basilic vein fistula on 05-26-66 by Dr. Trula Slade.   Pt is concerned about occasional shooting pain from left upper arm to wrist, no steal sx's, no signs or symptoms of infection. I spoke with Dr. Scot Dock by phone; the basilic vein is adjacent to the basilic nerve, which is manipulated a great deal during this procedure, and pt's sx's are consistent with the nerve being manipulated. This is expected to improved over time.      Follow up on 08-04-17 with a PA as scheduled for post op evaluation.    Thank you for allowing [place] to participate in this patient's care.  Clemon Chambers, RN, MSN, FNP-C [facility] Office: [phone]  07/22/2017, 4:39 PM  Clinic MD: Scot Dock on call

## 2017-07-22 NOTE — Telephone Encounter (Signed)
Patient call c/o "swelling and shooting pain in left arm" s/p second stage BVT. She claims she has been elevating arm and exercising hand. Denies any increase pain with gripping, or any signs of infection. [facility] nurse and PCP have seen and recommended check here. Appt given to see NP today.

## 2017-07-29 ENCOUNTER — Encounter (HOSPITAL_COMMUNITY)
Admission: RE | Admit: 2017-07-29 | Discharge: 2017-07-29 | Disposition: A | Discharge status: Home / Self Care | Payer: Medicare Other | Source: Ambulatory Visit | Attending: Nephrology | Admitting: Nephrology

## 2017-07-29 VITALS — BP 180/78 | HR 67 | Temp 98.3°F | Resp 20

## 2017-07-29 DIAGNOSIS — N184 Chronic kidney disease, stage 4 (severe): Secondary | ICD-10-CM | POA: Insufficient documentation

## 2017-07-29 DIAGNOSIS — D631 Anemia in chronic kidney disease: Secondary | ICD-10-CM | POA: Diagnosis present

## 2017-07-29 DIAGNOSIS — N179 Acute kidney failure, unspecified: Secondary | ICD-10-CM

## 2017-07-29 LAB — CBC WITH DIFFERENTIAL/PLATELET
ABS IMMATURE GRANULOCYTES: 0 10*3/uL (ref 0.0–0.1)
BASOS PCT: 1 %
Basophils Absolute: 0.1 10*3/uL (ref 0.0–0.1)
Eosinophils Absolute: 0.1 10*3/uL (ref 0.0–0.7)
Eosinophils Relative: 2 %
HCT: 36.9 % (ref 36.0–46.0)
Hemoglobin: 11.5 g/dL — ABNORMAL LOW (ref 12.0–15.0)
Immature Granulocytes: 0 %
Lymphocytes Relative: 30 %
Lymphs Abs: 1.9 10*3/uL (ref 0.7–4.0)
MCH: 28.5 pg (ref 26.0–34.0)
MCHC: 31.2 g/dL (ref 30.0–36.0)
MCV: 91.3 fL (ref 78.0–100.0)
MONO ABS: 0.3 10*3/uL (ref 0.1–1.0)
MONOS PCT: 5 %
NEUTROS ABS: 4.1 10*3/uL (ref 1.7–7.7)
Neutrophils Relative %: 62 %
Platelets: 218 10*3/uL (ref 150–400)
RBC: 4.04 MIL/uL (ref 3.87–5.11)
RDW: 14.6 % (ref 11.5–15.5)
WBC: 6.6 10*3/uL (ref 4.0–10.5)

## 2017-07-29 LAB — COMPREHENSIVE METABOLIC PANEL
ALBUMIN: 3.4 g/dL — AB (ref 3.5–5.0)
ALK PHOS: 31 U/L — AB (ref 38–126)
ALT: 15 U/L (ref 14–54)
AST: 22 U/L (ref 15–41)
Anion gap: 8 (ref 5–15)
BILIRUBIN TOTAL: 0.7 mg/dL (ref 0.3–1.2)
BUN: 35 mg/dL — AB (ref 6–20)
CO2: 26 mmol/L (ref 22–32)
Calcium: 9.2 mg/dL (ref 8.9–10.3)
Chloride: 107 mmol/L (ref 101–111)
Creatinine, Ser: 3.23 mg/dL — ABNORMAL HIGH (ref 0.44–1.00)
GFR calc Af Amer: 15 mL/min — ABNORMAL LOW (ref >60)
GFR calc non Af Amer: 13 mL/min — ABNORMAL LOW (ref >60)
GLUCOSE: 238 mg/dL — AB (ref 65–99)
POTASSIUM: 4.1 mmol/L (ref 3.5–5.1)
Sodium: 141 mmol/L (ref 135–145)
TOTAL PROTEIN: 6.2 g/dL — AB (ref 6.5–8.1)

## 2017-07-29 LAB — PHOSPHORUS: Phosphorus: 5.5 mg/dL — ABNORMAL HIGH (ref 2.5–4.6)

## 2017-07-29 MED ORDER — DARBEPOETIN ALFA 40 MCG/0.4ML IJ SOSY
PREFILLED_SYRINGE | INTRAMUSCULAR | Status: AC
  Administered 2017-07-29: 40 ug via SUBCUTANEOUS
  Filled 2017-07-29: qty 0.4

## 2017-07-29 MED ORDER — DARBEPOETIN ALFA 40 MCG/0.4ML IJ SOSY
40.0000 ug | PREFILLED_SYRINGE | INTRAMUSCULAR | Status: DC
  Administered 2017-07-29: 40 ug via SUBCUTANEOUS

## 2017-08-04 ENCOUNTER — Other Ambulatory Visit: Payer: Self-pay

## 2017-08-04 ENCOUNTER — Ambulatory Visit (INDEPENDENT_AMBULATORY_CARE_PROVIDER_SITE_OTHER): Payer: Self-pay | Admitting: Physician Assistant

## 2017-08-04 VITALS — BP 158/77 | HR 54 | Temp 97.5°F | Resp 16 | Ht 64.5 in | Wt 122.0 lb

## 2017-08-04 DIAGNOSIS — N185 Chronic kidney disease, stage 5: Secondary | ICD-10-CM

## 2017-08-04 NOTE — Progress Notes (Signed)
Vitals:   08/04/17 1252  BP: (!) 160/76  Pulse: (!) 54  Resp: 16  Temp: (!) 97.5 F (36.4 C)  TempSrc: Oral  SpO2: 100%  Weight: 122 lb (55.3 kg)  Height: 5' 4.5" (1.638 m)

## 2017-08-04 NOTE — Progress Notes (Addendum)
POST OPERATIVE OFFICE NOTE    CC:  F/u for surgery  HPI:  This is a 82 y.o. female who is s/p 2nd stage left BVT on 07/10/17 by Dr. Trula Slade.  Her 1st stage was done on 04/24/17 by Dr. Trula Slade.  She was seen a couple of weeks ago by the NP for occasional shooting pains in her left arm as well as numbness.  She does not have any pain in her left hand nor numbness just in the forearm.  She states she still has a little bit of swelling in the upper arm..  She is not yet on hemodialysis.  Her nephrologist is Dr. Marval Regal.  Allergies  Allergen Reactions  . Gemfibrozil Other (See Comments)    Myalgia   . Lotemax [Loteprednol Etabonate] Other (See Comments)    burning  . Statins Other (See Comments)    Muscle weakness  . Doxycycline Other (See Comments)    UNSPECIFIED REACTION   . Codeine Other (See Comments)    headache  . Hydrocodone-Acetaminophen Other (See Comments)    Headache. Tolerates acetaminophen.    Current Outpatient Medications  Medication Sig Dispense Refill  . azelastine (ASTELIN) 0.1 % nasal spray Place 2 sprays into both nostrils 2 (two) times daily as needed for rhinitis.     . calcitRIOL (ROCALTROL) 0.25 MCG capsule Take 0.25 mcg by mouth daily.     . diclofenac sodium (VOLTAREN) 1 % GEL Apply 2 g topically 4 (four) times daily as needed (for hand pain).     Marland Kitchen escitalopram (LEXAPRO) 5 MG tablet Take 5 mg by mouth 3 (three) times daily.     . fenofibrate (TRICOR) 145 MG tablet Take 72.5 mg by mouth every morning.     . ferrous sulfate 325 (65 FE) MG EC tablet Take 1 tablet (325 mg total) by mouth 2 (two) times daily. 60 tablet 0  . insulin lispro (HUMALOG) 100 UNIT/ML injection Inject 2-4 Units into the skin 3 (three) times daily before meals. Sliding scale before meals three times a day  150-200 Give 2 units 200-250 give 3 units 250-300 give 4 units     . lipase/protease/amylase (CREON) 36000 UNITS CPEP capsule Take 36,000-72,000 Units by mouth See admin instructions.  Take 72000 units by mouth 3 times daily with meals and take 36000 units by mouth with snacks    . metoprolol succinate (TOPROL-XL) 25 MG 24 hr tablet Take 25 mg by mouth daily.  4  . pantoprazole (PROTONIX) 40 MG tablet Take 1 tablet (40 mg total) by mouth 2 (two) times daily before a meal. (Patient taking differently: Take 40 mg by mouth daily. ) 60 tablet 0  . potassium chloride (K-DUR,KLOR-CON) 10 MEQ tablet Take 1 tablet (10 mEq total) by mouth daily. 7 tablet 0  . Propylene Glycol (SYSTANE COMPLETE) 0.6 % SOLN Place 1 drop into both eyes 4 (four) times daily as needed (for dry eyes).    . sodium bicarbonate 650 MG tablet Take 2 tablets (1,300 mg total) by mouth 2 (two) times daily. 60 tablet 0  . telmisartan (MICARDIS) 20 MG tablet Take 20 mg by mouth daily.    Marland Kitchen telmisartan-hydrochlorothiazide (MICARDIS HCT) 80-25 MG tablet Take 1 tablet by mouth daily.  0   No current facility-administered medications for this visit.      ROS:  See HPI  Physical Exam:  Vitals:   08/04/17 1252 08/04/17 1258  BP: (!) 160/76 (!) 158/77  Pulse: (!) 54 (!) 54  Resp: 16  Temp: (!) 97.5 F (36.4 C)   SpO2: 100%    Vitals:   08/04/17 1252  Weight: 122 lb (55.3 kg)  Height: 5' 4.5" (1.638 m)   Body mass index is 20.62 kg/m.   Incision:  Nicely healed Extremities:  Easily palpable left radial/ulnar pulses; there is an excellent thrill/bruit within the fistula.  Minimal swelling LU arm.   Assessment/Plan:  This is a 82 y.o. female who is s/p: 2nd stage left BVT  -pt has an excellent thrill/bruit within her fistula and is now ready to use when she should need it.  Discussed with pt that if she doesn't feel the thrill in the fistula to let us know.  -her numbness and shooting pain is likely due to nerve irritation during surgery.  This should improve over time.  Discussed with pt that this could take weeks or months.     Leontine Locket, PA-C [facility] [phone]  Clinic MD:  Trula Slade

## 2017-08-26 ENCOUNTER — Ambulatory Visit (HOSPITAL_COMMUNITY)
Admission: RE | Admit: 2017-08-26 | Discharge: 2017-08-26 | Disposition: A | Discharge status: Home / Self Care | Payer: Medicare Other | Source: Ambulatory Visit | Attending: Nephrology | Admitting: Nephrology

## 2017-08-26 VITALS — BP 176/76 | HR 63 | Temp 98.0°F | Resp 18 | Ht 61.0 in | Wt 124.0 lb

## 2017-08-26 DIAGNOSIS — N184 Chronic kidney disease, stage 4 (severe): Secondary | ICD-10-CM | POA: Diagnosis present

## 2017-08-26 DIAGNOSIS — D631 Anemia in chronic kidney disease: Secondary | ICD-10-CM | POA: Diagnosis present

## 2017-08-26 DIAGNOSIS — N179 Acute kidney failure, unspecified: Secondary | ICD-10-CM

## 2017-08-26 LAB — CBC WITH DIFFERENTIAL/PLATELET
Abs Immature Granulocytes: 0 10*3/uL (ref 0.0–0.1)
Basophils Absolute: 0 10*3/uL (ref 0.0–0.1)
Basophils Relative: 1 %
EOS ABS: 0.1 10*3/uL (ref 0.0–0.7)
EOS PCT: 2 %
HCT: 36.7 % (ref 36.0–46.0)
Hemoglobin: 11.3 g/dL — ABNORMAL LOW (ref 12.0–15.0)
Immature Granulocytes: 0 %
Lymphocytes Relative: 26 %
Lymphs Abs: 1.6 10*3/uL (ref 0.7–4.0)
MCH: 28.7 pg (ref 26.0–34.0)
MCHC: 30.8 g/dL (ref 30.0–36.0)
MCV: 93.1 fL (ref 78.0–100.0)
MONO ABS: 0.3 10*3/uL (ref 0.1–1.0)
Monocytes Relative: 5 %
Neutro Abs: 3.9 10*3/uL (ref 1.7–7.7)
Neutrophils Relative %: 66 %
Platelets: 210 10*3/uL (ref 150–400)
RBC: 3.94 MIL/uL (ref 3.87–5.11)
RDW: 14.8 % (ref 11.5–15.5)
WBC: 5.9 10*3/uL (ref 4.0–10.5)

## 2017-08-26 LAB — IRON AND TIBC
Iron: 70 ug/dL (ref 28–170)
Saturation Ratios: 21 % (ref 10.4–31.8)
TIBC: 330 ug/dL (ref 250–450)
UIBC: 260 ug/dL

## 2017-08-26 LAB — COMPREHENSIVE METABOLIC PANEL
ALT: 18 U/L (ref 0–44)
AST: 21 U/L (ref 15–41)
Albumin: 3.4 g/dL — ABNORMAL LOW (ref 3.5–5.0)
Alkaline Phosphatase: 31 U/L — ABNORMAL LOW (ref 38–126)
Anion gap: 8 (ref 5–15)
BILIRUBIN TOTAL: 0.7 mg/dL (ref 0.3–1.2)
BUN: 42 mg/dL — ABNORMAL HIGH (ref 8–23)
CALCIUM: 9.1 mg/dL (ref 8.9–10.3)
CO2: 25 mmol/L (ref 22–32)
CREATININE: 3.22 mg/dL — AB (ref 0.44–1.00)
Chloride: 108 mmol/L (ref 98–111)
GFR, EST AFRICAN AMERICAN: 15 mL/min — AB (ref >60)
GFR, EST NON AFRICAN AMERICAN: 13 mL/min — AB (ref >60)
Glucose, Bld: 175 mg/dL — ABNORMAL HIGH (ref 70–99)
Potassium: 4 mmol/L (ref 3.5–5.1)
Sodium: 141 mmol/L (ref 135–145)
TOTAL PROTEIN: 5.8 g/dL — AB (ref 6.5–8.1)

## 2017-08-26 LAB — PHOSPHORUS: Phosphorus: 5.1 mg/dL — ABNORMAL HIGH (ref 2.5–4.6)

## 2017-08-26 LAB — FERRITIN: FERRITIN: 100 ng/mL (ref 11–307)

## 2017-08-26 MED ORDER — DARBEPOETIN ALFA 40 MCG/0.4ML IJ SOSY
40.0000 ug | PREFILLED_SYRINGE | INTRAMUSCULAR | Status: DC
  Administered 2017-08-26: 40 ug via SUBCUTANEOUS

## 2017-08-26 MED ORDER — DARBEPOETIN ALFA 40 MCG/0.4ML IJ SOSY
PREFILLED_SYRINGE | INTRAMUSCULAR | Status: DC
Start: 2017-08-26 — End: 2017-08-27
  Filled 2017-08-26: qty 0.4

## 2017-09-23 ENCOUNTER — Encounter (HOSPITAL_COMMUNITY)
Admission: RE | Admit: 2017-09-23 | Discharge: 2017-09-23 | Disposition: A | Discharge status: Home / Self Care | Payer: Medicare Other | Source: Ambulatory Visit | Attending: Nephrology | Admitting: Nephrology

## 2017-09-23 VITALS — BP 146/81 | HR 58 | Temp 97.7°F | Resp 18 | Ht 63.0 in | Wt 123.0 lb

## 2017-09-23 DIAGNOSIS — D631 Anemia in chronic kidney disease: Secondary | ICD-10-CM | POA: Diagnosis present

## 2017-09-23 DIAGNOSIS — N184 Chronic kidney disease, stage 4 (severe): Secondary | ICD-10-CM | POA: Insufficient documentation

## 2017-09-23 DIAGNOSIS — N179 Acute kidney failure, unspecified: Secondary | ICD-10-CM

## 2017-09-23 LAB — COMPREHENSIVE METABOLIC PANEL
ALBUMIN: 3.5 g/dL (ref 3.5–5.0)
ALK PHOS: 33 U/L — AB (ref 38–126)
ALT: 17 U/L (ref 0–44)
AST: 20 U/L (ref 15–41)
Anion gap: 9 (ref 5–15)
BUN: 40 mg/dL — ABNORMAL HIGH (ref 8–23)
CHLORIDE: 106 mmol/L (ref 98–111)
CO2: 27 mmol/L (ref 22–32)
Calcium: 9.7 mg/dL (ref 8.9–10.3)
Creatinine, Ser: 3.28 mg/dL — ABNORMAL HIGH (ref 0.44–1.00)
GFR calc Af Amer: 14 mL/min — ABNORMAL LOW (ref >60)
GFR calc non Af Amer: 12 mL/min — ABNORMAL LOW (ref >60)
GLUCOSE: 163 mg/dL — AB (ref 70–99)
Potassium: 4.1 mmol/L (ref 3.5–5.1)
SODIUM: 142 mmol/L (ref 135–145)
Total Bilirubin: 0.7 mg/dL (ref 0.3–1.2)
Total Protein: 6.6 g/dL (ref 6.5–8.1)

## 2017-09-23 LAB — CBC WITH DIFFERENTIAL/PLATELET
ABS IMMATURE GRANULOCYTES: 0 10*3/uL (ref 0.0–0.1)
Basophils Absolute: 0 10*3/uL (ref 0.0–0.1)
Basophils Relative: 1 %
Eosinophils Absolute: 0.1 10*3/uL (ref 0.0–0.7)
Eosinophils Relative: 2 %
HCT: 38.3 % (ref 36.0–46.0)
HEMOGLOBIN: 12 g/dL (ref 12.0–15.0)
IMMATURE GRANULOCYTES: 0 %
LYMPHS ABS: 1.9 10*3/uL (ref 0.7–4.0)
LYMPHS PCT: 32 %
MCH: 29.2 pg (ref 26.0–34.0)
MCHC: 31.3 g/dL (ref 30.0–36.0)
MCV: 93.2 fL (ref 78.0–100.0)
MONOS PCT: 6 %
Monocytes Absolute: 0.3 10*3/uL (ref 0.1–1.0)
NEUTROS ABS: 3.5 10*3/uL (ref 1.7–7.7)
Neutrophils Relative %: 59 %
Platelets: 249 10*3/uL (ref 150–400)
RBC: 4.11 MIL/uL (ref 3.87–5.11)
RDW: 14.6 % (ref 11.5–15.5)
WBC: 5.9 10*3/uL (ref 4.0–10.5)

## 2017-09-23 LAB — PHOSPHORUS: Phosphorus: 5.8 mg/dL — ABNORMAL HIGH (ref 2.5–4.6)

## 2017-09-23 MED ORDER — DARBEPOETIN ALFA 40 MCG/0.4ML IJ SOSY: 40.0000 ug | PREFILLED_SYRINGE | INTRAMUSCULAR | Status: DC

## 2017-09-24 LAB — PTH, INTACT AND CALCIUM
Calcium, Total (PTH): 9.7 mg/dL (ref 8.7–10.3)
PTH: 192 pg/mL — AB (ref 15–65)

## 2017-10-03 ENCOUNTER — Other Ambulatory Visit: Payer: Self-pay | Admitting: Otolaryngology

## 2017-10-03 DIAGNOSIS — D333 Benign neoplasm of cranial nerves: Secondary | ICD-10-CM

## 2017-10-07 ENCOUNTER — Ambulatory Visit (HOSPITAL_COMMUNITY)
Admission: RE | Admit: 2017-10-07 | Discharge: 2017-10-07 | Disposition: A | Discharge status: Home / Self Care | Payer: Medicare Other | Source: Ambulatory Visit | Attending: Nephrology | Admitting: Nephrology

## 2017-10-07 VITALS — BP 153/72 | HR 59 | Temp 97.5°F | Ht 64.0 in | Wt 122.0 lb

## 2017-10-07 DIAGNOSIS — N179 Acute kidney failure, unspecified: Secondary | ICD-10-CM

## 2017-10-07 DIAGNOSIS — D631 Anemia in chronic kidney disease: Secondary | ICD-10-CM | POA: Insufficient documentation

## 2017-10-07 DIAGNOSIS — N184 Chronic kidney disease, stage 4 (severe): Secondary | ICD-10-CM | POA: Diagnosis present

## 2017-10-07 LAB — POCT HEMOGLOBIN-HEMACUE: HEMOGLOBIN: 11.6 g/dL — AB (ref 12.0–15.0)

## 2017-10-07 MED ORDER — DARBEPOETIN ALFA 40 MCG/0.4ML IJ SOSY
40.0000 ug | PREFILLED_SYRINGE | INTRAMUSCULAR | Status: DC
  Administered 2017-10-07: 40 ug via SUBCUTANEOUS

## 2017-10-07 MED ORDER — DARBEPOETIN ALFA 40 MCG/0.4ML IJ SOSY
PREFILLED_SYRINGE | INTRAMUSCULAR | Status: AC
  Administered 2017-10-07: 40 ug via SUBCUTANEOUS
  Filled 2017-10-07: qty 0.4

## 2017-10-21 ENCOUNTER — Encounter (HOSPITAL_COMMUNITY): Payer: Medicare Other

## 2017-11-04 ENCOUNTER — Ambulatory Visit (HOSPITAL_COMMUNITY)
Admission: RE | Admit: 2017-11-04 | Discharge: 2017-11-04 | Disposition: A | Discharge status: Home / Self Care | Payer: Medicare Other | Source: Ambulatory Visit | Attending: Nephrology | Admitting: Nephrology

## 2017-11-04 VITALS — BP 146/64 | HR 58 | Resp 18

## 2017-11-04 DIAGNOSIS — N179 Acute kidney failure, unspecified: Secondary | ICD-10-CM

## 2017-11-04 DIAGNOSIS — N184 Chronic kidney disease, stage 4 (severe): Secondary | ICD-10-CM | POA: Diagnosis present

## 2017-11-04 DIAGNOSIS — D631 Anemia in chronic kidney disease: Secondary | ICD-10-CM | POA: Insufficient documentation

## 2017-11-04 LAB — COMPREHENSIVE METABOLIC PANEL
ALK PHOS: 32 U/L — AB (ref 38–126)
ALT: 18 U/L (ref 0–44)
AST: 22 U/L (ref 15–41)
Albumin: 3.2 g/dL — ABNORMAL LOW (ref 3.5–5.0)
Anion gap: 12 (ref 5–15)
BUN: 55 mg/dL — ABNORMAL HIGH (ref 8–23)
CHLORIDE: 108 mmol/L (ref 98–111)
CO2: 23 mmol/L (ref 22–32)
CREATININE: 3.59 mg/dL — AB (ref 0.44–1.00)
Calcium: 9.4 mg/dL (ref 8.9–10.3)
GFR calc non Af Amer: 11 mL/min — ABNORMAL LOW (ref >60)
GFR, EST AFRICAN AMERICAN: 13 mL/min — AB (ref >60)
Glucose, Bld: 154 mg/dL — ABNORMAL HIGH (ref 70–99)
Potassium: 3.9 mmol/L (ref 3.5–5.1)
SODIUM: 143 mmol/L (ref 135–145)
Total Bilirubin: 0.6 mg/dL (ref 0.3–1.2)
Total Protein: 6 g/dL — ABNORMAL LOW (ref 6.5–8.1)

## 2017-11-04 LAB — CBC WITH DIFFERENTIAL/PLATELET
Abs Immature Granulocytes: 0 10*3/uL (ref 0.0–0.1)
Basophils Absolute: 0.1 10*3/uL (ref 0.0–0.1)
Basophils Relative: 1 %
EOS ABS: 0.1 10*3/uL (ref 0.0–0.7)
EOS PCT: 1 %
HEMATOCRIT: 35.8 % — AB (ref 36.0–46.0)
HEMOGLOBIN: 11.4 g/dL — AB (ref 12.0–15.0)
Immature Granulocytes: 0 %
LYMPHS ABS: 1.4 10*3/uL (ref 0.7–4.0)
LYMPHS PCT: 21 %
MCH: 29.8 pg (ref 26.0–34.0)
MCHC: 31.8 g/dL (ref 30.0–36.0)
MCV: 93.7 fL (ref 78.0–100.0)
Monocytes Absolute: 0.3 10*3/uL (ref 0.1–1.0)
Monocytes Relative: 5 %
Neutro Abs: 5.1 10*3/uL (ref 1.7–7.7)
Neutrophils Relative %: 72 %
Platelets: 227 10*3/uL (ref 150–400)
RBC: 3.82 MIL/uL — AB (ref 3.87–5.11)
RDW: 14.6 % (ref 11.5–15.5)
WBC: 7 10*3/uL (ref 4.0–10.5)

## 2017-11-04 LAB — PHOSPHORUS: Phosphorus: 4.8 mg/dL — ABNORMAL HIGH (ref 2.5–4.6)

## 2017-11-04 LAB — IRON AND TIBC
IRON: 65 ug/dL (ref 28–170)
Saturation Ratios: 19 % (ref 10.4–31.8)
TIBC: 337 ug/dL (ref 250–450)
UIBC: 272 ug/dL

## 2017-11-04 LAB — FERRITIN: FERRITIN: 79 ng/mL (ref 11–307)

## 2017-11-04 MED ORDER — DARBEPOETIN ALFA 40 MCG/0.4ML IJ SOSY
40.0000 ug | PREFILLED_SYRINGE | INTRAMUSCULAR | Status: DC
  Administered 2017-11-04: 40 ug via SUBCUTANEOUS

## 2017-11-04 MED ORDER — DARBEPOETIN ALFA 40 MCG/0.4ML IJ SOSY
PREFILLED_SYRINGE | INTRAMUSCULAR | Status: AC
  Administered 2017-11-04: 40 ug via SUBCUTANEOUS
  Filled 2017-11-04: qty 0.4

## 2017-12-02 ENCOUNTER — Ambulatory Visit (HOSPITAL_COMMUNITY)
Admission: RE | Admit: 2017-12-02 | Discharge: 2017-12-02 | Disposition: A | Discharge status: Home / Self Care | Payer: Medicare Other | Source: Ambulatory Visit | Attending: Nephrology | Admitting: Nephrology

## 2017-12-02 VITALS — BP 155/76 | HR 58 | Temp 98.4°F | Resp 20

## 2017-12-02 DIAGNOSIS — N179 Acute kidney failure, unspecified: Secondary | ICD-10-CM

## 2017-12-02 DIAGNOSIS — N184 Chronic kidney disease, stage 4 (severe): Secondary | ICD-10-CM | POA: Diagnosis present

## 2017-12-02 DIAGNOSIS — D631 Anemia in chronic kidney disease: Secondary | ICD-10-CM | POA: Diagnosis not present

## 2017-12-02 LAB — COMPREHENSIVE METABOLIC PANEL
ALBUMIN: 3.6 g/dL (ref 3.5–5.0)
ALT: 19 U/L (ref 0–44)
ANION GAP: 7 (ref 5–15)
AST: 19 U/L (ref 15–41)
Alkaline Phosphatase: 30 U/L — ABNORMAL LOW (ref 38–126)
BILIRUBIN TOTAL: 0.7 mg/dL (ref 0.3–1.2)
BUN: 59 mg/dL — AB (ref 8–23)
CHLORIDE: 111 mmol/L (ref 98–111)
CO2: 24 mmol/L (ref 22–32)
Calcium: 9.4 mg/dL (ref 8.9–10.3)
Creatinine, Ser: 3.54 mg/dL — ABNORMAL HIGH (ref 0.44–1.00)
GFR calc Af Amer: 13 mL/min — ABNORMAL LOW (ref >60)
GFR calc non Af Amer: 11 mL/min — ABNORMAL LOW (ref >60)
Glucose, Bld: 145 mg/dL — ABNORMAL HIGH (ref 70–99)
POTASSIUM: 3.8 mmol/L (ref 3.5–5.1)
SODIUM: 142 mmol/L (ref 135–145)
TOTAL PROTEIN: 6.7 g/dL (ref 6.5–8.1)

## 2017-12-02 LAB — CBC WITH DIFFERENTIAL/PLATELET
ABS IMMATURE GRANULOCYTES: 0.02 10*3/uL (ref 0.00–0.07)
BASOS ABS: 0.1 10*3/uL (ref 0.0–0.1)
Basophils Relative: 1 %
EOS ABS: 0.1 10*3/uL (ref 0.0–0.5)
Eosinophils Relative: 1 %
HEMATOCRIT: 40.3 % (ref 36.0–46.0)
HEMOGLOBIN: 12.3 g/dL (ref 12.0–15.0)
Immature Granulocytes: 0 %
LYMPHS PCT: 22 %
Lymphs Abs: 1.7 10*3/uL (ref 0.7–4.0)
MCH: 28.7 pg (ref 26.0–34.0)
MCHC: 30.5 g/dL (ref 30.0–36.0)
MCV: 94.2 fL (ref 80.0–100.0)
MONOS PCT: 4 %
Monocytes Absolute: 0.3 10*3/uL (ref 0.1–1.0)
NEUTROS ABS: 5.7 10*3/uL (ref 1.7–7.7)
NRBC: 0 % (ref 0.0–0.2)
Neutrophils Relative %: 72 %
Platelets: 251 10*3/uL (ref 150–400)
RBC: 4.28 MIL/uL (ref 3.87–5.11)
RDW: 14.6 % (ref 11.5–15.5)
WBC: 7.9 10*3/uL (ref 4.0–10.5)

## 2017-12-02 LAB — PHOSPHORUS: Phosphorus: 5.3 mg/dL — ABNORMAL HIGH (ref 2.5–4.6)

## 2017-12-02 MED ORDER — DARBEPOETIN ALFA 40 MCG/0.4ML IJ SOSY: 40.0000 ug | PREFILLED_SYRINGE | INTRAMUSCULAR | Status: DC

## 2017-12-17 ENCOUNTER — Ambulatory Visit (HOSPITAL_COMMUNITY)
Admission: RE | Admit: 2017-12-17 | Discharge: 2017-12-17 | Disposition: A | Discharge status: Home / Self Care | Payer: Medicare Other | Source: Ambulatory Visit | Attending: Nephrology | Admitting: Nephrology

## 2017-12-17 VITALS — BP 170/77 | HR 82

## 2017-12-17 DIAGNOSIS — N184 Chronic kidney disease, stage 4 (severe): Secondary | ICD-10-CM | POA: Insufficient documentation

## 2017-12-17 DIAGNOSIS — N179 Acute kidney failure, unspecified: Secondary | ICD-10-CM

## 2017-12-17 DIAGNOSIS — D631 Anemia in chronic kidney disease: Secondary | ICD-10-CM | POA: Diagnosis present

## 2017-12-17 LAB — POCT HEMOGLOBIN-HEMACUE: HEMOGLOBIN: 12.8 g/dL (ref 12.0–15.0)

## 2017-12-17 MED ORDER — DARBEPOETIN ALFA 40 MCG/0.4ML IJ SOSY: 40.0000 ug | PREFILLED_SYRINGE | INTRAMUSCULAR | Status: DC

## 2017-12-30 ENCOUNTER — Encounter (HOSPITAL_COMMUNITY): Payer: Medicare Other

## 2017-12-31 ENCOUNTER — Ambulatory Visit (HOSPITAL_COMMUNITY)
Admission: RE | Admit: 2017-12-31 | Discharge: 2017-12-31 | Disposition: A | Discharge status: Home / Self Care | Payer: Medicare Other | Source: Ambulatory Visit | Attending: Nephrology | Admitting: Nephrology

## 2017-12-31 VITALS — BP 152/69 | HR 57 | Resp 18

## 2017-12-31 DIAGNOSIS — N179 Acute kidney failure, unspecified: Secondary | ICD-10-CM

## 2017-12-31 LAB — CBC WITH DIFFERENTIAL/PLATELET
Abs Immature Granulocytes: 0.02 10*3/uL (ref 0.00–0.07)
BASOS ABS: 0.1 10*3/uL (ref 0.0–0.1)
Basophils Relative: 1 %
EOS PCT: 2 %
Eosinophils Absolute: 0.1 10*3/uL (ref 0.0–0.5)
HCT: 36.4 % (ref 36.0–46.0)
HEMOGLOBIN: 11.5 g/dL — AB (ref 12.0–15.0)
Immature Granulocytes: 0 %
LYMPHS ABS: 2.1 10*3/uL (ref 0.7–4.0)
LYMPHS PCT: 32 %
MCH: 29.1 pg (ref 26.0–34.0)
MCHC: 31.6 g/dL (ref 30.0–36.0)
MCV: 92.2 fL (ref 80.0–100.0)
MONO ABS: 0.4 10*3/uL (ref 0.1–1.0)
Monocytes Relative: 5 %
Neutro Abs: 4 10*3/uL (ref 1.7–7.7)
Neutrophils Relative %: 60 %
Platelets: 215 10*3/uL (ref 150–400)
RBC: 3.95 MIL/uL (ref 3.87–5.11)
RDW: 13.8 % (ref 11.5–15.5)
WBC: 6.6 10*3/uL (ref 4.0–10.5)
nRBC: 0 % (ref 0.0–0.2)

## 2017-12-31 LAB — COMPREHENSIVE METABOLIC PANEL
ALBUMIN: 3.4 g/dL — AB (ref 3.5–5.0)
ALK PHOS: 31 U/L — AB (ref 38–126)
ALT: 20 U/L (ref 0–44)
AST: 25 U/L (ref 15–41)
Anion gap: 10 (ref 5–15)
BUN: 40 mg/dL — AB (ref 8–23)
CALCIUM: 9 mg/dL (ref 8.9–10.3)
CO2: 23 mmol/L (ref 22–32)
CREATININE: 3.17 mg/dL — AB (ref 0.44–1.00)
Chloride: 106 mmol/L (ref 98–111)
GFR calc Af Amer: 15 mL/min — ABNORMAL LOW (ref >60)
GFR calc non Af Amer: 13 mL/min — ABNORMAL LOW (ref >60)
GLUCOSE: 74 mg/dL (ref 70–99)
Potassium: 3.5 mmol/L (ref 3.5–5.1)
SODIUM: 139 mmol/L (ref 135–145)
Total Bilirubin: 0.6 mg/dL (ref 0.3–1.2)
Total Protein: 6.4 g/dL — ABNORMAL LOW (ref 6.5–8.1)

## 2017-12-31 LAB — PHOSPHORUS: Phosphorus: 5.4 mg/dL — ABNORMAL HIGH (ref 2.5–4.6)

## 2017-12-31 LAB — IRON AND TIBC
Iron: 86 ug/dL (ref 28–170)
Saturation Ratios: 24 % (ref 10.4–31.8)
TIBC: 363 ug/dL (ref 250–450)
UIBC: 277 ug/dL

## 2017-12-31 LAB — FERRITIN: Ferritin: 178 ng/mL (ref 11–307)

## 2017-12-31 MED ORDER — DARBEPOETIN ALFA 40 MCG/0.4ML IJ SOSY
PREFILLED_SYRINGE | INTRAMUSCULAR | Status: AC
  Administered 2017-12-31: 40 ug via SUBCUTANEOUS
  Filled 2017-12-31: qty 0.4

## 2017-12-31 MED ORDER — DARBEPOETIN ALFA 40 MCG/0.4ML IJ SOSY
40.0000 ug | PREFILLED_SYRINGE | INTRAMUSCULAR | Status: DC
  Administered 2017-12-31: 40 ug via SUBCUTANEOUS

## 2018-01-01 LAB — PTH, INTACT AND CALCIUM
Calcium, Total (PTH): 9.4 mg/dL (ref 8.7–10.3)
PTH: 105 pg/mL — ABNORMAL HIGH (ref 15–65)

## 2018-01-14 ENCOUNTER — Encounter (HOSPITAL_COMMUNITY): Payer: Medicare Other

## 2018-01-28 ENCOUNTER — Ambulatory Visit (HOSPITAL_COMMUNITY)
Admission: RE | Admit: 2018-01-28 | Discharge: 2018-01-28 | Disposition: A | Discharge status: Home / Self Care | Payer: Medicare Other | Source: Ambulatory Visit | Attending: Nephrology | Admitting: Nephrology

## 2018-01-28 VITALS — BP 168/81 | HR 61 | Resp 18

## 2018-01-28 DIAGNOSIS — N179 Acute kidney failure, unspecified: Secondary | ICD-10-CM

## 2018-01-28 LAB — COMPREHENSIVE METABOLIC PANEL
ALBUMIN: 3.2 g/dL — AB (ref 3.5–5.0)
ALT: 21 U/L (ref 0–44)
AST: 24 U/L (ref 15–41)
Alkaline Phosphatase: 36 U/L — ABNORMAL LOW (ref 38–126)
Anion gap: 7 (ref 5–15)
BUN: 30 mg/dL — AB (ref 8–23)
CALCIUM: 9.2 mg/dL (ref 8.9–10.3)
CHLORIDE: 110 mmol/L (ref 98–111)
CO2: 24 mmol/L (ref 22–32)
Creatinine, Ser: 3.17 mg/dL — ABNORMAL HIGH (ref 0.44–1.00)
GFR calc Af Amer: 15 mL/min — ABNORMAL LOW (ref >60)
GFR calc non Af Amer: 13 mL/min — ABNORMAL LOW (ref >60)
GLUCOSE: 154 mg/dL — AB (ref 70–99)
Potassium: 4.4 mmol/L (ref 3.5–5.1)
Sodium: 141 mmol/L (ref 135–145)
Total Bilirubin: 0.6 mg/dL (ref 0.3–1.2)
Total Protein: 6.2 g/dL — ABNORMAL LOW (ref 6.5–8.1)

## 2018-01-28 LAB — CBC WITH DIFFERENTIAL/PLATELET
Abs Immature Granulocytes: 0.02 10*3/uL (ref 0.00–0.07)
BASOS ABS: 0.1 10*3/uL (ref 0.0–0.1)
Basophils Relative: 1 %
Eosinophils Absolute: 0.1 10*3/uL (ref 0.0–0.5)
Eosinophils Relative: 2 %
HCT: 37.5 % (ref 36.0–46.0)
Hemoglobin: 11.5 g/dL — ABNORMAL LOW (ref 12.0–15.0)
IMMATURE GRANULOCYTES: 0 %
Lymphocytes Relative: 27 %
Lymphs Abs: 2 10*3/uL (ref 0.7–4.0)
MCH: 28.9 pg (ref 26.0–34.0)
MCHC: 30.7 g/dL (ref 30.0–36.0)
MCV: 94.2 fL (ref 80.0–100.0)
Monocytes Absolute: 0.3 10*3/uL (ref 0.1–1.0)
Monocytes Relative: 4 %
Neutro Abs: 5.1 10*3/uL (ref 1.7–7.7)
Neutrophils Relative %: 66 %
Platelets: 254 10*3/uL (ref 150–400)
RBC: 3.98 MIL/uL (ref 3.87–5.11)
RDW: 14 % (ref 11.5–15.5)
WBC: 7.6 10*3/uL (ref 4.0–10.5)
nRBC: 0 % (ref 0.0–0.2)

## 2018-01-28 LAB — POCT HEMOGLOBIN-HEMACUE: Hemoglobin: 11.8 g/dL — ABNORMAL LOW (ref 12.0–15.0)

## 2018-01-28 LAB — PHOSPHORUS: Phosphorus: 4.7 mg/dL — ABNORMAL HIGH (ref 2.5–4.6)

## 2018-01-28 MED ORDER — DARBEPOETIN ALFA 40 MCG/0.4ML IJ SOSY
40.0000 ug | PREFILLED_SYRINGE | INTRAMUSCULAR | Status: DC
  Administered 2018-01-28: 40 ug via SUBCUTANEOUS

## 2018-01-28 MED ORDER — DARBEPOETIN ALFA 40 MCG/0.4ML IJ SOSY
PREFILLED_SYRINGE | INTRAMUSCULAR | Status: AC
  Filled 2018-01-28: qty 0.4

## 2018-02-25 ENCOUNTER — Ambulatory Visit (HOSPITAL_COMMUNITY)
Admission: RE | Admit: 2018-02-25 | Discharge: 2018-02-25 | Disposition: A | Discharge status: Home / Self Care | Payer: Medicare Other | Source: Ambulatory Visit | Attending: Nephrology | Admitting: Nephrology

## 2018-02-25 VITALS — BP 151/68 | HR 58 | Temp 97.7°F | Resp 20

## 2018-02-25 DIAGNOSIS — N179 Acute kidney failure, unspecified: Secondary | ICD-10-CM | POA: Insufficient documentation

## 2018-02-25 LAB — CBC WITH DIFFERENTIAL/PLATELET
Abs Immature Granulocytes: 0.02 10*3/uL (ref 0.00–0.07)
Basophils Absolute: 0 10*3/uL (ref 0.0–0.1)
Basophils Relative: 1 %
Eosinophils Absolute: 0.1 10*3/uL (ref 0.0–0.5)
Eosinophils Relative: 1 %
HCT: 39.5 % (ref 36.0–46.0)
Hemoglobin: 12 g/dL (ref 12.0–15.0)
IMMATURE GRANULOCYTES: 0 %
Lymphocytes Relative: 24 %
Lymphs Abs: 1.5 10*3/uL (ref 0.7–4.0)
MCH: 28.8 pg (ref 26.0–34.0)
MCHC: 30.4 g/dL (ref 30.0–36.0)
MCV: 95 fL (ref 80.0–100.0)
Monocytes Absolute: 0.3 10*3/uL (ref 0.1–1.0)
Monocytes Relative: 4 %
NEUTROS PCT: 70 %
Neutro Abs: 4.2 10*3/uL (ref 1.7–7.7)
Platelets: 242 10*3/uL (ref 150–400)
RBC: 4.16 MIL/uL (ref 3.87–5.11)
RDW: 13.9 % (ref 11.5–15.5)
WBC: 6.1 10*3/uL (ref 4.0–10.5)
nRBC: 0 % (ref 0.0–0.2)

## 2018-02-25 LAB — COMPREHENSIVE METABOLIC PANEL
ALT: 17 U/L (ref 0–44)
AST: 26 U/L (ref 15–41)
Albumin: 3.2 g/dL — ABNORMAL LOW (ref 3.5–5.0)
Alkaline Phosphatase: 30 U/L — ABNORMAL LOW (ref 38–126)
Anion gap: 6 (ref 5–15)
BILIRUBIN TOTAL: 0.6 mg/dL (ref 0.3–1.2)
BUN: 33 mg/dL — ABNORMAL HIGH (ref 8–23)
CO2: 24 mmol/L (ref 22–32)
Calcium: 9.2 mg/dL (ref 8.9–10.3)
Chloride: 109 mmol/L (ref 98–111)
Creatinine, Ser: 3.94 mg/dL — ABNORMAL HIGH (ref 0.44–1.00)
GFR calc Af Amer: 12 mL/min — ABNORMAL LOW (ref >60)
GFR calc non Af Amer: 10 mL/min — ABNORMAL LOW (ref >60)
Glucose, Bld: 172 mg/dL — ABNORMAL HIGH (ref 70–99)
POTASSIUM: 3.7 mmol/L (ref 3.5–5.1)
Sodium: 139 mmol/L (ref 135–145)
TOTAL PROTEIN: 5.9 g/dL — AB (ref 6.5–8.1)

## 2018-02-25 LAB — IRON AND TIBC
Iron: 77 ug/dL (ref 28–170)
SATURATION RATIOS: 24 % (ref 10.4–31.8)
TIBC: 328 ug/dL (ref 250–450)
UIBC: 251 ug/dL

## 2018-02-25 LAB — FERRITIN: Ferritin: 158 ng/mL (ref 11–307)

## 2018-02-25 LAB — PHOSPHORUS: Phosphorus: 4.7 mg/dL — ABNORMAL HIGH (ref 2.5–4.6)

## 2018-02-25 MED ORDER — DARBEPOETIN ALFA 40 MCG/0.4ML IJ SOSY: 40.0000 ug | PREFILLED_SYRINGE | INTRAMUSCULAR | Status: DC

## 2018-03-02 ENCOUNTER — Encounter (HOSPITAL_COMMUNITY): Payer: Self-pay | Admitting: Emergency Medicine

## 2018-03-02 ENCOUNTER — Emergency Department (HOSPITAL_COMMUNITY): Payer: Medicare Other

## 2018-03-02 ENCOUNTER — Other Ambulatory Visit: Payer: Self-pay

## 2018-03-02 ENCOUNTER — Emergency Department (HOSPITAL_COMMUNITY)
Admission: EM | Admit: 2018-03-02 | Discharge: 2018-03-02 | Disposition: A | Discharge status: Home / Self Care | Payer: Medicare Other | Attending: Emergency Medicine | Admitting: Emergency Medicine

## 2018-03-02 DIAGNOSIS — E213 Hyperparathyroidism, unspecified: Secondary | ICD-10-CM | POA: Insufficient documentation

## 2018-03-02 DIAGNOSIS — S161XXA Strain of muscle, fascia and tendon at neck level, initial encounter: Secondary | ICD-10-CM

## 2018-03-02 DIAGNOSIS — S0990XA Unspecified injury of head, initial encounter: Secondary | ICD-10-CM | POA: Diagnosis present

## 2018-03-02 DIAGNOSIS — Y999 Unspecified external cause status: Secondary | ICD-10-CM | POA: Insufficient documentation

## 2018-03-02 DIAGNOSIS — Y92410 Unspecified street and highway as the place of occurrence of the external cause: Secondary | ICD-10-CM | POA: Diagnosis not present

## 2018-03-02 DIAGNOSIS — N183 Chronic kidney disease, stage 3 (moderate): Secondary | ICD-10-CM | POA: Insufficient documentation

## 2018-03-02 DIAGNOSIS — E119 Type 2 diabetes mellitus without complications: Secondary | ICD-10-CM | POA: Diagnosis not present

## 2018-03-02 DIAGNOSIS — S0083XA Contusion of other part of head, initial encounter: Secondary | ICD-10-CM | POA: Diagnosis not present

## 2018-03-02 DIAGNOSIS — I129 Hypertensive chronic kidney disease with stage 1 through stage 4 chronic kidney disease, or unspecified chronic kidney disease: Secondary | ICD-10-CM | POA: Insufficient documentation

## 2018-03-02 DIAGNOSIS — Y9389 Activity, other specified: Secondary | ICD-10-CM | POA: Insufficient documentation

## 2018-03-02 MED ORDER — ACETAMINOPHEN 500 MG PO TABS
1000.0000 mg | ORAL_TABLET | Freq: Once | ORAL | Status: AC
  Administered 2018-03-02: 1000 mg via ORAL
  Filled 2018-03-02: qty 2
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
## 2018-03-11 ENCOUNTER — Ambulatory Visit (HOSPITAL_COMMUNITY)
Admission: RE | Admit: 2018-03-11 | Discharge: 2018-03-11 | Disposition: A | Discharge status: Home / Self Care | Payer: Medicare Other | Source: Ambulatory Visit | Attending: Nephrology | Admitting: Nephrology

## 2018-03-11 VITALS — BP 161/71 | HR 55 | Temp 97.7°F | Resp 20

## 2018-03-11 DIAGNOSIS — N179 Acute kidney failure, unspecified: Secondary | ICD-10-CM

## 2018-03-11 LAB — POCT HEMOGLOBIN-HEMACUE: Hemoglobin: 12.2 g/dL (ref 12.0–15.0)

## 2018-03-11 MED ORDER — DARBEPOETIN ALFA 40 MCG/0.4ML IJ SOSY: 40.0000 ug | PREFILLED_SYRINGE | INTRAMUSCULAR | Status: DC

## 2018-03-18 ENCOUNTER — Other Ambulatory Visit: Payer: Medicare Other

## 2018-03-24 ENCOUNTER — Ambulatory Visit
Admission: RE | Admit: 2018-03-24 | Discharge: 2018-03-24 | Disposition: A | Discharge status: Home / Self Care | Payer: Medicare Other | Source: Ambulatory Visit | Attending: Otolaryngology | Admitting: Otolaryngology

## 2018-03-24 DIAGNOSIS — D333 Benign neoplasm of cranial nerves: Secondary | ICD-10-CM

## 2018-03-25 ENCOUNTER — Ambulatory Visit (HOSPITAL_COMMUNITY)
Admission: RE | Admit: 2018-03-25 | Discharge: 2018-03-25 | Disposition: A | Discharge status: Home / Self Care | Payer: Medicare Other | Source: Ambulatory Visit | Attending: Nephrology | Admitting: Nephrology

## 2018-03-25 ENCOUNTER — Encounter (HOSPITAL_COMMUNITY): Payer: Medicare Other

## 2018-03-25 VITALS — BP 135/76 | HR 56 | Temp 97.5°F | Resp 20

## 2018-03-25 DIAGNOSIS — N179 Acute kidney failure, unspecified: Secondary | ICD-10-CM | POA: Diagnosis present

## 2018-03-25 LAB — CBC WITH DIFFERENTIAL/PLATELET
Abs Immature Granulocytes: 0.02 10*3/uL (ref 0.00–0.07)
Basophils Absolute: 0 10*3/uL (ref 0.0–0.1)
Basophils Relative: 1 %
Eosinophils Absolute: 0.1 10*3/uL (ref 0.0–0.5)
Eosinophils Relative: 1 %
HEMATOCRIT: 36.1 % (ref 36.0–46.0)
Hemoglobin: 11.7 g/dL — ABNORMAL LOW (ref 12.0–15.0)
IMMATURE GRANULOCYTES: 0 %
LYMPHS ABS: 1.6 10*3/uL (ref 0.7–4.0)
Lymphocytes Relative: 26 %
MCH: 30.2 pg (ref 26.0–34.0)
MCHC: 32.4 g/dL (ref 30.0–36.0)
MCV: 93.3 fL (ref 80.0–100.0)
Monocytes Absolute: 0.3 10*3/uL (ref 0.1–1.0)
Monocytes Relative: 5 %
Neutro Abs: 4.1 10*3/uL (ref 1.7–7.7)
Neutrophils Relative %: 67 %
Platelets: 204 10*3/uL (ref 150–400)
RBC: 3.87 MIL/uL (ref 3.87–5.11)
RDW: 14 % (ref 11.5–15.5)
WBC: 6.1 10*3/uL (ref 4.0–10.5)
nRBC: 0 % (ref 0.0–0.2)

## 2018-03-25 LAB — PHOSPHORUS: Phosphorus: 6.4 mg/dL — ABNORMAL HIGH (ref 2.5–4.6)

## 2018-03-25 LAB — COMPREHENSIVE METABOLIC PANEL
ALT: 20 U/L (ref 0–44)
ANION GAP: 10 (ref 5–15)
AST: 23 U/L (ref 15–41)
Albumin: 3.5 g/dL (ref 3.5–5.0)
Alkaline Phosphatase: 30 U/L — ABNORMAL LOW (ref 38–126)
BUN: 33 mg/dL — ABNORMAL HIGH (ref 8–23)
CO2: 23 mmol/L (ref 22–32)
Calcium: 9.2 mg/dL (ref 8.9–10.3)
Chloride: 108 mmol/L (ref 98–111)
Creatinine, Ser: 3.02 mg/dL — ABNORMAL HIGH (ref 0.44–1.00)
GFR calc non Af Amer: 14 mL/min — ABNORMAL LOW (ref >60)
GFR, EST AFRICAN AMERICAN: 16 mL/min — AB (ref >60)
Glucose, Bld: 100 mg/dL — ABNORMAL HIGH (ref 70–99)
POTASSIUM: 4.1 mmol/L (ref 3.5–5.1)
Sodium: 141 mmol/L (ref 135–145)
Total Bilirubin: 0.7 mg/dL (ref 0.3–1.2)
Total Protein: 6.3 g/dL — ABNORMAL LOW (ref 6.5–8.1)

## 2018-03-25 LAB — POCT HEMOGLOBIN-HEMACUE: Hemoglobin: 11.2 g/dL — ABNORMAL LOW (ref 12.0–15.0)

## 2018-03-25 MED ORDER — DARBEPOETIN ALFA 40 MCG/0.4ML IJ SOSY
40.0000 ug | PREFILLED_SYRINGE | INTRAMUSCULAR | Status: DC
  Administered 2018-03-25: 40 ug via SUBCUTANEOUS

## 2018-03-25 MED ORDER — DARBEPOETIN ALFA 40 MCG/0.4ML IJ SOSY
PREFILLED_SYRINGE | INTRAMUSCULAR | Status: AC
  Filled 2018-03-25: qty 0.4

## 2018-03-26 LAB — PTH, INTACT AND CALCIUM
Calcium, Total (PTH): 9.2 mg/dL (ref 8.7–10.3)
PTH: 129 pg/mL — ABNORMAL HIGH (ref 15–65)

## 2018-04-08 ENCOUNTER — Encounter (HOSPITAL_COMMUNITY): Payer: Medicare Other

## 2018-04-22 ENCOUNTER — Ambulatory Visit (HOSPITAL_COMMUNITY)
Admission: RE | Admit: 2018-04-22 | Discharge: 2018-04-22 | Disposition: A | Discharge status: Home / Self Care | Payer: Medicare Other | Source: Ambulatory Visit | Attending: Nephrology | Admitting: Nephrology

## 2018-04-22 VITALS — BP 166/81 | HR 61 | Temp 98.0°F | Resp 20

## 2018-04-22 DIAGNOSIS — N179 Acute kidney failure, unspecified: Secondary | ICD-10-CM | POA: Insufficient documentation

## 2018-04-22 DIAGNOSIS — N189 Chronic kidney disease, unspecified: Secondary | ICD-10-CM | POA: Insufficient documentation

## 2018-04-22 DIAGNOSIS — D631 Anemia in chronic kidney disease: Secondary | ICD-10-CM | POA: Insufficient documentation

## 2018-04-22 LAB — CBC WITH DIFFERENTIAL/PLATELET
Abs Immature Granulocytes: 0.01 10*3/uL (ref 0.00–0.07)
Basophils Absolute: 0 10*3/uL (ref 0.0–0.1)
Basophils Relative: 1 %
Eosinophils Absolute: 0.1 10*3/uL (ref 0.0–0.5)
Eosinophils Relative: 1 %
HCT: 40.7 % (ref 36.0–46.0)
Hemoglobin: 12.4 g/dL (ref 12.0–15.0)
Immature Granulocytes: 0 %
Lymphocytes Relative: 27 %
Lymphs Abs: 1.8 10*3/uL (ref 0.7–4.0)
MCH: 28.9 pg (ref 26.0–34.0)
MCHC: 30.5 g/dL (ref 30.0–36.0)
MCV: 94.9 fL (ref 80.0–100.0)
Monocytes Absolute: 0.3 10*3/uL (ref 0.1–1.0)
Monocytes Relative: 5 %
Neutro Abs: 4.4 10*3/uL (ref 1.7–7.7)
Neutrophils Relative %: 66 %
Platelets: 242 10*3/uL (ref 150–400)
RBC: 4.29 MIL/uL (ref 3.87–5.11)
RDW: 14 % (ref 11.5–15.5)
WBC: 6.7 10*3/uL (ref 4.0–10.5)
nRBC: 0 % (ref 0.0–0.2)

## 2018-04-22 LAB — COMPREHENSIVE METABOLIC PANEL
ALT: 17 U/L (ref 0–44)
AST: 22 U/L (ref 15–41)
Albumin: 3.6 g/dL (ref 3.5–5.0)
Alkaline Phosphatase: 32 U/L — ABNORMAL LOW (ref 38–126)
Anion gap: 11 (ref 5–15)
BUN: 54 mg/dL — AB (ref 8–23)
CO2: 21 mmol/L — ABNORMAL LOW (ref 22–32)
Calcium: 9.3 mg/dL (ref 8.9–10.3)
Chloride: 107 mmol/L (ref 98–111)
Creatinine, Ser: 3.23 mg/dL — ABNORMAL HIGH (ref 0.44–1.00)
GFR calc Af Amer: 15 mL/min — ABNORMAL LOW (ref >60)
GFR calc non Af Amer: 13 mL/min — ABNORMAL LOW (ref >60)
Glucose, Bld: 166 mg/dL — ABNORMAL HIGH (ref 70–99)
Potassium: 4 mmol/L (ref 3.5–5.1)
Sodium: 139 mmol/L (ref 135–145)
Total Bilirubin: 0.4 mg/dL (ref 0.3–1.2)
Total Protein: 6.4 g/dL — ABNORMAL LOW (ref 6.5–8.1)

## 2018-04-22 LAB — PHOSPHORUS: Phosphorus: 7.6 mg/dL — ABNORMAL HIGH (ref 2.5–4.6)

## 2018-04-22 LAB — IRON AND TIBC
Iron: 107 ug/dL (ref 28–170)
Saturation Ratios: 28 % (ref 10.4–31.8)
TIBC: 384 ug/dL (ref 250–450)
UIBC: 277 ug/dL

## 2018-04-22 LAB — FERRITIN: Ferritin: 152 ng/mL (ref 11–307)

## 2018-04-22 MED ORDER — DARBEPOETIN ALFA 40 MCG/0.4ML IJ SOSY: 40.0000 ug | PREFILLED_SYRINGE | INTRAMUSCULAR | Status: DC

## 2018-04-23 LAB — POCT HEMOGLOBIN-HEMACUE: Hemoglobin: 12 g/dL (ref 12.0–15.0)

## 2018-05-05 ENCOUNTER — Ambulatory Visit (HOSPITAL_COMMUNITY)
Admission: RE | Admit: 2018-05-05 | Discharge: 2018-05-05 | Disposition: A | Discharge status: Home / Self Care | Payer: Medicare Other | Source: Ambulatory Visit | Attending: Nephrology | Admitting: Nephrology

## 2018-05-05 ENCOUNTER — Other Ambulatory Visit: Payer: Self-pay

## 2018-05-05 VITALS — BP 165/77 | HR 64 | Temp 97.6°F | Resp 20

## 2018-05-05 DIAGNOSIS — N179 Acute kidney failure, unspecified: Secondary | ICD-10-CM | POA: Diagnosis present

## 2018-05-05 MED ORDER — DARBEPOETIN ALFA 40 MCG/0.4ML IJ SOSY
40.0000 ug | PREFILLED_SYRINGE | INTRAMUSCULAR | Status: DC
  Administered 2018-05-05: 40 ug via SUBCUTANEOUS

## 2018-05-05 MED ORDER — DARBEPOETIN ALFA 40 MCG/0.4ML IJ SOSY
PREFILLED_SYRINGE | INTRAMUSCULAR | Status: AC
  Filled 2018-05-05: qty 0.4

## 2018-05-06 ENCOUNTER — Encounter (HOSPITAL_COMMUNITY): Payer: Medicare Other

## 2018-05-06 LAB — POCT HEMOGLOBIN-HEMACUE: Hemoglobin: 11.8 g/dL — ABNORMAL LOW (ref 12.0–15.0)

## 2018-05-20 ENCOUNTER — Encounter (HOSPITAL_COMMUNITY): Payer: Medicare Other

## 2018-06-03 ENCOUNTER — Encounter (HOSPITAL_COMMUNITY): Payer: Medicare Other

## 2018-06-17 ENCOUNTER — Other Ambulatory Visit: Payer: Self-pay

## 2018-06-17 ENCOUNTER — Ambulatory Visit (HOSPITAL_COMMUNITY)
Admission: RE | Admit: 2018-06-17 | Discharge: 2018-06-17 | Disposition: A | Discharge status: Home / Self Care | Payer: Medicare Other | Source: Ambulatory Visit | Attending: Nephrology | Admitting: Nephrology

## 2018-06-17 VITALS — BP 155/66 | HR 68 | Temp 98.5°F

## 2018-06-17 DIAGNOSIS — D631 Anemia in chronic kidney disease: Secondary | ICD-10-CM | POA: Insufficient documentation

## 2018-06-17 DIAGNOSIS — N179 Acute kidney failure, unspecified: Secondary | ICD-10-CM | POA: Diagnosis present

## 2018-06-17 LAB — FERRITIN: Ferritin: 176 ng/mL (ref 11–307)

## 2018-06-17 LAB — COMPREHENSIVE METABOLIC PANEL
ALT: 17 U/L (ref 0–44)
AST: 23 U/L (ref 15–41)
Albumin: 3.4 g/dL — ABNORMAL LOW (ref 3.5–5.0)
Alkaline Phosphatase: 34 U/L — ABNORMAL LOW (ref 38–126)
Anion gap: 10 (ref 5–15)
BUN: 35 mg/dL — ABNORMAL HIGH (ref 8–23)
CO2: 23 mmol/L (ref 22–32)
Calcium: 9.5 mg/dL (ref 8.9–10.3)
Chloride: 110 mmol/L (ref 98–111)
Creatinine, Ser: 3.97 mg/dL — ABNORMAL HIGH (ref 0.44–1.00)
GFR calc Af Amer: 12 mL/min — ABNORMAL LOW (ref >60)
GFR calc non Af Amer: 10 mL/min — ABNORMAL LOW (ref >60)
Glucose, Bld: 169 mg/dL — ABNORMAL HIGH (ref 70–99)
Potassium: 3.8 mmol/L (ref 3.5–5.1)
Sodium: 143 mmol/L (ref 135–145)
Total Bilirubin: 0.6 mg/dL (ref 0.3–1.2)
Total Protein: 5.8 g/dL — ABNORMAL LOW (ref 6.5–8.1)

## 2018-06-17 LAB — CBC WITH DIFFERENTIAL/PLATELET
Abs Immature Granulocytes: 0.02 10*3/uL (ref 0.00–0.07)
Basophils Absolute: 0 10*3/uL (ref 0.0–0.1)
Basophils Relative: 0 %
Eosinophils Absolute: 0.1 10*3/uL (ref 0.0–0.5)
Eosinophils Relative: 2 %
HCT: 35.4 % — ABNORMAL LOW (ref 36.0–46.0)
Hemoglobin: 11.2 g/dL — ABNORMAL LOW (ref 12.0–15.0)
Immature Granulocytes: 0 %
Lymphocytes Relative: 27 %
Lymphs Abs: 1.8 10*3/uL (ref 0.7–4.0)
MCH: 29.9 pg (ref 26.0–34.0)
MCHC: 31.6 g/dL (ref 30.0–36.0)
MCV: 94.7 fL (ref 80.0–100.0)
Monocytes Absolute: 0.2 10*3/uL (ref 0.1–1.0)
Monocytes Relative: 3 %
Neutro Abs: 4.6 10*3/uL (ref 1.7–7.7)
Neutrophils Relative %: 68 %
Platelets: 194 10*3/uL (ref 150–400)
RBC: 3.74 MIL/uL — ABNORMAL LOW (ref 3.87–5.11)
RDW: 14.2 % (ref 11.5–15.5)
WBC: 6.8 10*3/uL (ref 4.0–10.5)
nRBC: 0 % (ref 0.0–0.2)

## 2018-06-17 LAB — PHOSPHORUS: Phosphorus: 4.3 mg/dL (ref 2.5–4.6)

## 2018-06-17 LAB — IRON AND TIBC
Iron: 83 ug/dL (ref 28–170)
Saturation Ratios: 27 % (ref 10.4–31.8)
TIBC: 312 ug/dL (ref 250–450)
UIBC: 229 ug/dL

## 2018-06-17 LAB — POCT HEMOGLOBIN-HEMACUE: Hemoglobin: 11.1 g/dL — ABNORMAL LOW (ref 12.0–15.0)

## 2018-06-17 MED ORDER — DARBEPOETIN ALFA 40 MCG/0.4ML IJ SOSY
PREFILLED_SYRINGE | INTRAMUSCULAR | Status: AC
  Filled 2018-06-17: qty 0.4

## 2018-06-17 MED ORDER — DARBEPOETIN ALFA 40 MCG/0.4ML IJ SOSY
40.0000 ug | PREFILLED_SYRINGE | INTRAMUSCULAR | Status: DC
  Administered 2018-06-17: 40 ug via SUBCUTANEOUS

## 2018-06-18 LAB — PTH, INTACT AND CALCIUM
Calcium, Total (PTH): 9.8 mg/dL (ref 8.7–10.3)
PTH: 78 pg/mL — ABNORMAL HIGH (ref 15–65)

## 2018-07-15 ENCOUNTER — Other Ambulatory Visit: Payer: Self-pay

## 2018-07-15 ENCOUNTER — Ambulatory Visit (HOSPITAL_COMMUNITY)
Admission: RE | Admit: 2018-07-15 | Discharge: 2018-07-15 | Disposition: A | Discharge status: Home / Self Care | Payer: Medicare Other | Source: Ambulatory Visit | Attending: Nephrology | Admitting: Nephrology

## 2018-07-15 VITALS — BP 133/81 | HR 58 | Temp 97.3°F | Resp 20

## 2018-07-15 DIAGNOSIS — N179 Acute kidney failure, unspecified: Secondary | ICD-10-CM

## 2018-07-15 LAB — COMPREHENSIVE METABOLIC PANEL
ALT: 17 U/L (ref 0–44)
AST: 18 U/L (ref 15–41)
Albumin: 3.5 g/dL (ref 3.5–5.0)
Alkaline Phosphatase: 34 U/L — ABNORMAL LOW (ref 38–126)
Anion gap: 8 (ref 5–15)
BUN: 36 mg/dL — ABNORMAL HIGH (ref 8–23)
CO2: 29 mmol/L (ref 22–32)
Calcium: 9.9 mg/dL (ref 8.9–10.3)
Chloride: 105 mmol/L (ref 98–111)
Creatinine, Ser: 3.33 mg/dL — ABNORMAL HIGH (ref 0.44–1.00)
GFR calc Af Amer: 14 mL/min — ABNORMAL LOW (ref >60)
GFR calc non Af Amer: 12 mL/min — ABNORMAL LOW (ref >60)
Glucose, Bld: 58 mg/dL — ABNORMAL LOW (ref 70–99)
Potassium: 3.5 mmol/L (ref 3.5–5.1)
Sodium: 142 mmol/L (ref 135–145)
Total Bilirubin: 0.5 mg/dL (ref 0.3–1.2)
Total Protein: 5.7 g/dL — ABNORMAL LOW (ref 6.5–8.1)

## 2018-07-15 LAB — CBC WITH DIFFERENTIAL/PLATELET
Abs Immature Granulocytes: 0.03 10*3/uL (ref 0.00–0.07)
Basophils Absolute: 0 10*3/uL (ref 0.0–0.1)
Basophils Relative: 1 %
Eosinophils Absolute: 0.1 10*3/uL (ref 0.0–0.5)
Eosinophils Relative: 1 %
HCT: 37 % (ref 36.0–46.0)
Hemoglobin: 11.6 g/dL — ABNORMAL LOW (ref 12.0–15.0)
Immature Granulocytes: 0 %
Lymphocytes Relative: 27 %
Lymphs Abs: 2 10*3/uL (ref 0.7–4.0)
MCH: 29.7 pg (ref 26.0–34.0)
MCHC: 31.4 g/dL (ref 30.0–36.0)
MCV: 94.6 fL (ref 80.0–100.0)
Monocytes Absolute: 0.4 10*3/uL (ref 0.1–1.0)
Monocytes Relative: 5 %
Neutro Abs: 4.8 10*3/uL (ref 1.7–7.7)
Neutrophils Relative %: 66 %
Platelets: 226 10*3/uL (ref 150–400)
RBC: 3.91 MIL/uL (ref 3.87–5.11)
RDW: 13.9 % (ref 11.5–15.5)
WBC: 7.2 10*3/uL (ref 4.0–10.5)
nRBC: 0 % (ref 0.0–0.2)

## 2018-07-15 LAB — POCT HEMOGLOBIN-HEMACUE: Hemoglobin: 11.2 g/dL — ABNORMAL LOW (ref 12.0–15.0)

## 2018-07-15 LAB — PHOSPHORUS: Phosphorus: 3.5 mg/dL (ref 2.5–4.6)

## 2018-07-15 MED ORDER — DARBEPOETIN ALFA 40 MCG/0.4ML IJ SOSY
40.0000 ug | PREFILLED_SYRINGE | INTRAMUSCULAR | Status: DC
  Administered 2018-07-15: 40 ug via SUBCUTANEOUS

## 2018-07-15 MED ORDER — DARBEPOETIN ALFA 40 MCG/0.4ML IJ SOSY
PREFILLED_SYRINGE | INTRAMUSCULAR | Status: AC
  Filled 2018-07-15: qty 0.4

## 2018-07-29 ENCOUNTER — Other Ambulatory Visit: Payer: Self-pay | Admitting: General Surgery

## 2018-07-29 DIAGNOSIS — D490 Neoplasm of unspecified behavior of digestive system: Secondary | ICD-10-CM

## 2018-08-12 ENCOUNTER — Other Ambulatory Visit: Payer: Self-pay

## 2018-08-12 ENCOUNTER — Ambulatory Visit (HOSPITAL_COMMUNITY)
Admission: RE | Admit: 2018-08-12 | Discharge: 2018-08-12 | Disposition: A | Discharge status: Home / Self Care | Payer: Medicare Other | Source: Ambulatory Visit | Attending: Nephrology | Admitting: Nephrology

## 2018-08-12 VITALS — BP 149/82 | HR 53 | Temp 97.9°F | Resp 18

## 2018-08-12 DIAGNOSIS — N179 Acute kidney failure, unspecified: Secondary | ICD-10-CM | POA: Diagnosis present

## 2018-08-12 DIAGNOSIS — D631 Anemia in chronic kidney disease: Secondary | ICD-10-CM | POA: Insufficient documentation

## 2018-08-12 DIAGNOSIS — N189 Chronic kidney disease, unspecified: Secondary | ICD-10-CM | POA: Insufficient documentation

## 2018-08-12 LAB — COMPREHENSIVE METABOLIC PANEL
ALT: 15 U/L (ref 0–44)
AST: 19 U/L (ref 15–41)
Albumin: 3.3 g/dL — ABNORMAL LOW (ref 3.5–5.0)
Alkaline Phosphatase: 28 U/L — ABNORMAL LOW (ref 38–126)
Anion gap: 7 (ref 5–15)
BUN: 32 mg/dL — ABNORMAL HIGH (ref 8–23)
CO2: 24 mmol/L (ref 22–32)
Calcium: 10.5 mg/dL — ABNORMAL HIGH (ref 8.9–10.3)
Chloride: 110 mmol/L (ref 98–111)
Creatinine, Ser: 4.24 mg/dL — ABNORMAL HIGH (ref 0.44–1.00)
GFR calc Af Amer: 11 mL/min — ABNORMAL LOW (ref >60)
GFR calc non Af Amer: 9 mL/min — ABNORMAL LOW (ref >60)
Glucose, Bld: 94 mg/dL (ref 70–99)
Potassium: 4 mmol/L (ref 3.5–5.1)
Sodium: 141 mmol/L (ref 135–145)
Total Bilirubin: 0.5 mg/dL (ref 0.3–1.2)
Total Protein: 5.6 g/dL — ABNORMAL LOW (ref 6.5–8.1)

## 2018-08-12 LAB — CBC WITH DIFFERENTIAL/PLATELET
Abs Immature Granulocytes: 0.02 10*3/uL (ref 0.00–0.07)
Basophils Absolute: 0 10*3/uL (ref 0.0–0.1)
Basophils Relative: 1 %
Eosinophils Absolute: 0.1 10*3/uL (ref 0.0–0.5)
Eosinophils Relative: 2 %
HCT: 35.4 % — ABNORMAL LOW (ref 36.0–46.0)
Hemoglobin: 11.4 g/dL — ABNORMAL LOW (ref 12.0–15.0)
Immature Granulocytes: 0 %
Lymphocytes Relative: 24 %
Lymphs Abs: 1.6 10*3/uL (ref 0.7–4.0)
MCH: 30.2 pg (ref 26.0–34.0)
MCHC: 32.2 g/dL (ref 30.0–36.0)
MCV: 93.9 fL (ref 80.0–100.0)
Monocytes Absolute: 0.3 10*3/uL (ref 0.1–1.0)
Monocytes Relative: 4 %
Neutro Abs: 4.5 10*3/uL (ref 1.7–7.7)
Neutrophils Relative %: 69 %
Platelets: 206 10*3/uL (ref 150–400)
RBC: 3.77 MIL/uL — ABNORMAL LOW (ref 3.87–5.11)
RDW: 13.6 % (ref 11.5–15.5)
WBC: 6.5 10*3/uL (ref 4.0–10.5)
nRBC: 0 % (ref 0.0–0.2)

## 2018-08-12 LAB — IRON AND TIBC
Iron: 83 ug/dL (ref 28–170)
Saturation Ratios: 26 % (ref 10.4–31.8)
TIBC: 318 ug/dL (ref 250–450)
UIBC: 235 ug/dL

## 2018-08-12 LAB — PHOSPHORUS: Phosphorus: 6.7 mg/dL — ABNORMAL HIGH (ref 2.5–4.6)

## 2018-08-12 LAB — POCT HEMOGLOBIN-HEMACUE: Hemoglobin: 11.5 g/dL — ABNORMAL LOW (ref 12.0–15.0)

## 2018-08-12 LAB — FERRITIN: Ferritin: 198 ng/mL (ref 11–307)

## 2018-08-12 MED ORDER — DARBEPOETIN ALFA 40 MCG/0.4ML IJ SOSY
PREFILLED_SYRINGE | INTRAMUSCULAR | Status: AC
  Administered 2018-08-12: 40 ug via SUBCUTANEOUS
  Filled 2018-08-12: qty 0.4

## 2018-08-12 MED ORDER — DARBEPOETIN ALFA 40 MCG/0.4ML IJ SOSY
40.0000 ug | PREFILLED_SYRINGE | INTRAMUSCULAR | Status: DC
  Administered 2018-08-12: 40 ug via SUBCUTANEOUS

## 2018-08-21 ENCOUNTER — Other Ambulatory Visit: Payer: Medicare Other

## 2018-09-08 ENCOUNTER — Other Ambulatory Visit: Payer: Self-pay

## 2018-09-09 ENCOUNTER — Ambulatory Visit (HOSPITAL_COMMUNITY)
Admission: RE | Admit: 2018-09-09 | Discharge: 2018-09-09 | Disposition: A | Discharge status: Home / Self Care | Payer: Medicare Other | Source: Ambulatory Visit | Attending: Nephrology | Admitting: Nephrology

## 2018-09-09 VITALS — BP 148/67 | HR 62 | Temp 97.2°F | Resp 20

## 2018-09-09 DIAGNOSIS — N179 Acute kidney failure, unspecified: Secondary | ICD-10-CM | POA: Insufficient documentation

## 2018-09-09 LAB — COMPREHENSIVE METABOLIC PANEL
ALT: 22 U/L (ref 0–44)
AST: 20 U/L (ref 15–41)
Albumin: 2.9 g/dL — ABNORMAL LOW (ref 3.5–5.0)
Alkaline Phosphatase: 32 U/L — ABNORMAL LOW (ref 38–126)
Anion gap: 9 (ref 5–15)
BUN: 42 mg/dL — ABNORMAL HIGH (ref 8–23)
CO2: 27 mmol/L (ref 22–32)
Calcium: 10.5 mg/dL — ABNORMAL HIGH (ref 8.9–10.3)
Chloride: 105 mmol/L (ref 98–111)
Creatinine, Ser: 3.92 mg/dL — ABNORMAL HIGH (ref 0.44–1.00)
GFR calc Af Amer: 12 mL/min — ABNORMAL LOW (ref >60)
GFR calc non Af Amer: 10 mL/min — ABNORMAL LOW (ref >60)
Glucose, Bld: 181 mg/dL — ABNORMAL HIGH (ref 70–99)
Potassium: 4.3 mmol/L (ref 3.5–5.1)
Sodium: 141 mmol/L (ref 135–145)
Total Bilirubin: 0.5 mg/dL (ref 0.3–1.2)
Total Protein: 5.4 g/dL — ABNORMAL LOW (ref 6.5–8.1)

## 2018-09-09 LAB — CBC WITH DIFFERENTIAL/PLATELET
Abs Immature Granulocytes: 0.04 10*3/uL (ref 0.00–0.07)
Basophils Absolute: 0 10*3/uL (ref 0.0–0.1)
Basophils Relative: 1 %
Eosinophils Absolute: 0.2 10*3/uL (ref 0.0–0.5)
Eosinophils Relative: 3 %
HCT: 34.6 % — ABNORMAL LOW (ref 36.0–46.0)
Hemoglobin: 10.9 g/dL — ABNORMAL LOW (ref 12.0–15.0)
Immature Granulocytes: 1 %
Lymphocytes Relative: 25 %
Lymphs Abs: 2 10*3/uL (ref 0.7–4.0)
MCH: 29.9 pg (ref 26.0–34.0)
MCHC: 31.5 g/dL (ref 30.0–36.0)
MCV: 95.1 fL (ref 80.0–100.0)
Monocytes Absolute: 0.5 10*3/uL (ref 0.1–1.0)
Monocytes Relative: 6 %
Neutro Abs: 5.1 10*3/uL (ref 1.7–7.7)
Neutrophils Relative %: 64 %
Platelets: 219 10*3/uL (ref 150–400)
RBC: 3.64 MIL/uL — ABNORMAL LOW (ref 3.87–5.11)
RDW: 14.1 % (ref 11.5–15.5)
WBC: 7.8 10*3/uL (ref 4.0–10.5)
nRBC: 0 % (ref 0.0–0.2)

## 2018-09-09 LAB — PHOSPHORUS: Phosphorus: 7.9 mg/dL — ABNORMAL HIGH (ref 2.5–4.6)

## 2018-09-09 MED ORDER — DARBEPOETIN ALFA 40 MCG/0.4ML IJ SOSY
PREFILLED_SYRINGE | INTRAMUSCULAR | Status: AC
  Filled 2018-09-09: qty 0.4

## 2018-09-09 MED ORDER — DARBEPOETIN ALFA 40 MCG/0.4ML IJ SOSY
40.0000 ug | PREFILLED_SYRINGE | INTRAMUSCULAR | Status: DC
  Administered 2018-09-09: 40 ug via SUBCUTANEOUS

## 2018-09-10 LAB — PTH, INTACT AND CALCIUM
Calcium, Total (PTH): 10.5 mg/dL — ABNORMAL HIGH (ref 8.7–10.3)
PTH: 73 pg/mL — ABNORMAL HIGH (ref 15–65)

## 2018-09-10 LAB — POCT HEMOGLOBIN-HEMACUE: Hemoglobin: 10.2 g/dL — ABNORMAL LOW (ref 12.0–15.0)

## 2018-09-18 ENCOUNTER — Ambulatory Visit
Admission: RE | Admit: 2018-09-18 | Discharge: 2018-09-18 | Disposition: A | Discharge status: Home / Self Care | Payer: Medicare Other | Source: Ambulatory Visit | Attending: General Surgery | Admitting: General Surgery

## 2018-09-18 ENCOUNTER — Other Ambulatory Visit: Payer: Self-pay

## 2018-09-18 DIAGNOSIS — D490 Neoplasm of unspecified behavior of digestive system: Secondary | ICD-10-CM

## 2018-10-07 ENCOUNTER — Other Ambulatory Visit: Payer: Self-pay

## 2018-10-07 ENCOUNTER — Encounter (HOSPITAL_COMMUNITY)
Admission: RE | Admit: 2018-10-07 | Discharge: 2018-10-07 | Disposition: A | Discharge status: Home / Self Care | Payer: Medicare Other | Source: Ambulatory Visit | Attending: Nephrology | Admitting: Nephrology

## 2018-10-07 VITALS — BP 158/70 | HR 60 | Resp 20

## 2018-10-07 DIAGNOSIS — N179 Acute kidney failure, unspecified: Secondary | ICD-10-CM | POA: Diagnosis not present

## 2018-10-07 DIAGNOSIS — N189 Chronic kidney disease, unspecified: Secondary | ICD-10-CM | POA: Insufficient documentation

## 2018-10-07 DIAGNOSIS — D631 Anemia in chronic kidney disease: Secondary | ICD-10-CM | POA: Insufficient documentation

## 2018-10-07 LAB — CBC WITH DIFFERENTIAL/PLATELET
Abs Immature Granulocytes: 0.03 10*3/uL (ref 0.00–0.07)
Basophils Absolute: 0 10*3/uL (ref 0.0–0.1)
Basophils Relative: 0 %
Eosinophils Absolute: 0.2 10*3/uL (ref 0.0–0.5)
Eosinophils Relative: 2 %
HCT: 37 % (ref 36.0–46.0)
Hemoglobin: 11.7 g/dL — ABNORMAL LOW (ref 12.0–15.0)
Immature Granulocytes: 0 %
Lymphocytes Relative: 26 %
Lymphs Abs: 2 10*3/uL (ref 0.7–4.0)
MCH: 29.8 pg (ref 26.0–34.0)
MCHC: 31.6 g/dL (ref 30.0–36.0)
MCV: 94.1 fL (ref 80.0–100.0)
Monocytes Absolute: 0.3 10*3/uL (ref 0.1–1.0)
Monocytes Relative: 4 %
Neutro Abs: 5.2 10*3/uL (ref 1.7–7.7)
Neutrophils Relative %: 68 %
Platelets: 194 10*3/uL (ref 150–400)
RBC: 3.93 MIL/uL (ref 3.87–5.11)
RDW: 14.4 % (ref 11.5–15.5)
WBC: 7.8 10*3/uL (ref 4.0–10.5)
nRBC: 0 % (ref 0.0–0.2)

## 2018-10-07 LAB — COMPREHENSIVE METABOLIC PANEL
ALT: 44 U/L (ref 0–44)
AST: 33 U/L (ref 15–41)
Albumin: 3 g/dL — ABNORMAL LOW (ref 3.5–5.0)
Alkaline Phosphatase: 34 U/L — ABNORMAL LOW (ref 38–126)
Anion gap: 8 (ref 5–15)
BUN: 26 mg/dL — ABNORMAL HIGH (ref 8–23)
CO2: 23 mmol/L (ref 22–32)
Calcium: 9.4 mg/dL (ref 8.9–10.3)
Chloride: 111 mmol/L (ref 98–111)
Creatinine, Ser: 3.31 mg/dL — ABNORMAL HIGH (ref 0.44–1.00)
GFR calc Af Amer: 14 mL/min — ABNORMAL LOW (ref >60)
GFR calc non Af Amer: 12 mL/min — ABNORMAL LOW (ref >60)
Glucose, Bld: 93 mg/dL (ref 70–99)
Potassium: 3.8 mmol/L (ref 3.5–5.1)
Sodium: 142 mmol/L (ref 135–145)
Total Bilirubin: 0.7 mg/dL (ref 0.3–1.2)
Total Protein: 5.7 g/dL — ABNORMAL LOW (ref 6.5–8.1)

## 2018-10-07 LAB — IRON AND TIBC
Iron: 75 ug/dL (ref 28–170)
Saturation Ratios: 32 % — ABNORMAL HIGH (ref 10.4–31.8)
TIBC: 232 ug/dL — ABNORMAL LOW (ref 250–450)
UIBC: 157 ug/dL

## 2018-10-07 LAB — POCT HEMOGLOBIN-HEMACUE: Hemoglobin: 11.4 g/dL — ABNORMAL LOW (ref 12.0–15.0)

## 2018-10-07 LAB — FERRITIN: Ferritin: 196 ng/mL (ref 11–307)

## 2018-10-07 LAB — PHOSPHORUS: Phosphorus: 6.5 mg/dL — ABNORMAL HIGH (ref 2.5–4.6)

## 2018-10-07 MED ORDER — DARBEPOETIN ALFA 40 MCG/0.4ML IJ SOSY
PREFILLED_SYRINGE | INTRAMUSCULAR | Status: AC
  Filled 2018-10-07: qty 0.4

## 2018-10-07 MED ORDER — DARBEPOETIN ALFA 40 MCG/0.4ML IJ SOSY
40.0000 ug | PREFILLED_SYRINGE | INTRAMUSCULAR | Status: DC
  Administered 2018-10-07: 40 ug via SUBCUTANEOUS

## 2018-11-04 ENCOUNTER — Other Ambulatory Visit: Payer: Self-pay

## 2018-11-04 ENCOUNTER — Ambulatory Visit (HOSPITAL_COMMUNITY)
Admission: RE | Admit: 2018-11-04 | Discharge: 2018-11-04 | Disposition: A | Discharge status: Home / Self Care | Payer: Medicare Other | Source: Ambulatory Visit | Attending: Nephrology | Admitting: Nephrology

## 2018-11-04 VITALS — BP 180/84 | HR 55 | Temp 97.3°F | Resp 20

## 2018-11-04 DIAGNOSIS — N179 Acute kidney failure, unspecified: Secondary | ICD-10-CM

## 2018-11-04 LAB — CBC WITH DIFFERENTIAL/PLATELET
Abs Immature Granulocytes: 0.02 10*3/uL (ref 0.00–0.07)
Basophils Absolute: 0 10*3/uL (ref 0.0–0.1)
Basophils Relative: 1 %
Eosinophils Absolute: 0.1 10*3/uL (ref 0.0–0.5)
Eosinophils Relative: 2 %
HCT: 38.8 % (ref 36.0–46.0)
Hemoglobin: 12.2 g/dL (ref 12.0–15.0)
Immature Granulocytes: 0 %
Lymphocytes Relative: 23 %
Lymphs Abs: 1.7 10*3/uL (ref 0.7–4.0)
MCH: 30 pg (ref 26.0–34.0)
MCHC: 31.4 g/dL (ref 30.0–36.0)
MCV: 95.3 fL (ref 80.0–100.0)
Monocytes Absolute: 0.3 10*3/uL (ref 0.1–1.0)
Monocytes Relative: 4 %
Neutro Abs: 5.2 10*3/uL (ref 1.7–7.7)
Neutrophils Relative %: 70 %
Platelets: 187 10*3/uL (ref 150–400)
RBC: 4.07 MIL/uL (ref 3.87–5.11)
RDW: 14.5 % (ref 11.5–15.5)
WBC: 7.4 10*3/uL (ref 4.0–10.5)
nRBC: 0 % (ref 0.0–0.2)

## 2018-11-04 LAB — COMPREHENSIVE METABOLIC PANEL
ALT: 43 U/L (ref 0–44)
AST: 40 U/L (ref 15–41)
Albumin: 3.1 g/dL — ABNORMAL LOW (ref 3.5–5.0)
Alkaline Phosphatase: 41 U/L (ref 38–126)
Anion gap: 6 (ref 5–15)
BUN: 26 mg/dL — ABNORMAL HIGH (ref 8–23)
CO2: 22 mmol/L (ref 22–32)
Calcium: 9.4 mg/dL (ref 8.9–10.3)
Chloride: 114 mmol/L — ABNORMAL HIGH (ref 98–111)
Creatinine, Ser: 3.18 mg/dL — ABNORMAL HIGH (ref 0.44–1.00)
GFR calc Af Amer: 15 mL/min — ABNORMAL LOW (ref >60)
GFR calc non Af Amer: 13 mL/min — ABNORMAL LOW (ref >60)
Glucose, Bld: 79 mg/dL (ref 70–99)
Potassium: 3.9 mmol/L (ref 3.5–5.1)
Sodium: 142 mmol/L (ref 135–145)
Total Bilirubin: 0.8 mg/dL (ref 0.3–1.2)
Total Protein: 5.6 g/dL — ABNORMAL LOW (ref 6.5–8.1)

## 2018-11-04 LAB — POCT HEMOGLOBIN-HEMACUE: Hemoglobin: 11.5 g/dL — ABNORMAL LOW (ref 12.0–15.0)

## 2018-11-04 LAB — PHOSPHORUS: Phosphorus: 4.7 mg/dL — ABNORMAL HIGH (ref 2.5–4.6)

## 2018-11-04 MED ORDER — DARBEPOETIN ALFA 40 MCG/0.4ML IJ SOSY
40.0000 ug | PREFILLED_SYRINGE | INTRAMUSCULAR | Status: DC
  Administered 2018-11-04: 40 ug via SUBCUTANEOUS

## 2018-11-04 MED ORDER — DARBEPOETIN ALFA 40 MCG/0.4ML IJ SOSY
PREFILLED_SYRINGE | INTRAMUSCULAR | Status: AC
  Filled 2018-11-04: qty 0.4

## 2018-11-27 ENCOUNTER — Other Ambulatory Visit: Payer: Self-pay | Admitting: Gastroenterology

## 2018-11-27 DIAGNOSIS — K921 Melena: Secondary | ICD-10-CM

## 2018-11-27 DIAGNOSIS — D649 Anemia, unspecified: Secondary | ICD-10-CM

## 2018-12-02 ENCOUNTER — Other Ambulatory Visit: Payer: Self-pay

## 2018-12-02 ENCOUNTER — Ambulatory Visit (HOSPITAL_COMMUNITY)
Admission: RE | Admit: 2018-12-02 | Discharge: 2018-12-02 | Disposition: A | Discharge status: Home / Self Care | Payer: Medicare Other | Source: Ambulatory Visit | Attending: Nephrology | Admitting: Nephrology

## 2018-12-02 VITALS — BP 176/101 | HR 73 | Resp 18

## 2018-12-02 DIAGNOSIS — D631 Anemia in chronic kidney disease: Secondary | ICD-10-CM | POA: Diagnosis not present

## 2018-12-02 DIAGNOSIS — N179 Acute kidney failure, unspecified: Secondary | ICD-10-CM | POA: Insufficient documentation

## 2018-12-02 DIAGNOSIS — N189 Chronic kidney disease, unspecified: Secondary | ICD-10-CM | POA: Insufficient documentation

## 2018-12-02 LAB — CBC WITH DIFFERENTIAL/PLATELET
Abs Immature Granulocytes: 0.01 10*3/uL (ref 0.00–0.07)
Basophils Absolute: 0.1 10*3/uL (ref 0.0–0.1)
Basophils Relative: 1 %
Eosinophils Absolute: 0.1 10*3/uL (ref 0.0–0.5)
Eosinophils Relative: 2 %
HCT: 40.3 % (ref 36.0–46.0)
Hemoglobin: 13 g/dL (ref 12.0–15.0)
Immature Granulocytes: 0 %
Lymphocytes Relative: 22 %
Lymphs Abs: 1.7 10*3/uL (ref 0.7–4.0)
MCH: 30.4 pg (ref 26.0–34.0)
MCHC: 32.3 g/dL (ref 30.0–36.0)
MCV: 94.2 fL (ref 80.0–100.0)
Monocytes Absolute: 0.3 10*3/uL (ref 0.1–1.0)
Monocytes Relative: 4 %
Neutro Abs: 5.3 10*3/uL (ref 1.7–7.7)
Neutrophils Relative %: 71 %
Platelets: 185 10*3/uL (ref 150–400)
RBC: 4.28 MIL/uL (ref 3.87–5.11)
RDW: 14.1 % (ref 11.5–15.5)
WBC: 7.5 10*3/uL (ref 4.0–10.5)
nRBC: 0 % (ref 0.0–0.2)

## 2018-12-02 LAB — COMPREHENSIVE METABOLIC PANEL
ALT: 40 U/L (ref 0–44)
AST: 29 U/L (ref 15–41)
Albumin: 3.2 g/dL — ABNORMAL LOW (ref 3.5–5.0)
Alkaline Phosphatase: 45 U/L (ref 38–126)
Anion gap: 11 (ref 5–15)
BUN: 27 mg/dL — ABNORMAL HIGH (ref 8–23)
CO2: 24 mmol/L (ref 22–32)
Calcium: 9.5 mg/dL (ref 8.9–10.3)
Chloride: 107 mmol/L (ref 98–111)
Creatinine, Ser: 3.24 mg/dL — ABNORMAL HIGH (ref 0.44–1.00)
GFR calc Af Amer: 15 mL/min — ABNORMAL LOW (ref >60)
GFR calc non Af Amer: 13 mL/min — ABNORMAL LOW (ref >60)
Glucose, Bld: 133 mg/dL — ABNORMAL HIGH (ref 70–99)
Potassium: 4.4 mmol/L (ref 3.5–5.1)
Sodium: 142 mmol/L (ref 135–145)
Total Bilirubin: 0.8 mg/dL (ref 0.3–1.2)
Total Protein: 6 g/dL — ABNORMAL LOW (ref 6.5–8.1)

## 2018-12-02 LAB — FERRITIN: Ferritin: 200 ng/mL (ref 11–307)

## 2018-12-02 LAB — IRON AND TIBC
Iron: 60 ug/dL (ref 28–170)
Saturation Ratios: 24 % (ref 10.4–31.8)
TIBC: 253 ug/dL (ref 250–450)
UIBC: 193 ug/dL

## 2018-12-02 LAB — PHOSPHORUS: Phosphorus: 5.5 mg/dL — ABNORMAL HIGH (ref 2.5–4.6)

## 2018-12-02 LAB — POCT HEMOGLOBIN-HEMACUE: Hemoglobin: 12.3 g/dL (ref 12.0–15.0)

## 2018-12-02 MED ORDER — DARBEPOETIN ALFA 40 MCG/0.4ML IJ SOSY: 40.0000 ug | PREFILLED_SYRINGE | INTRAMUSCULAR | Status: DC

## 2018-12-03 LAB — PTH, INTACT AND CALCIUM
Calcium, Total (PTH): 9.3 mg/dL (ref 8.7–10.3)
PTH: 165 pg/mL — ABNORMAL HIGH (ref 15–65)

## 2018-12-16 ENCOUNTER — Other Ambulatory Visit: Payer: Self-pay

## 2018-12-16 ENCOUNTER — Ambulatory Visit (HOSPITAL_COMMUNITY)
Admission: RE | Admit: 2018-12-16 | Discharge: 2018-12-16 | Disposition: A | Discharge status: Home / Self Care | Payer: Medicare Other | Source: Ambulatory Visit | Attending: Nephrology | Admitting: Nephrology

## 2018-12-16 VITALS — BP 171/81 | HR 48 | Temp 97.0°F | Resp 20

## 2018-12-16 DIAGNOSIS — N179 Acute kidney failure, unspecified: Secondary | ICD-10-CM | POA: Insufficient documentation

## 2018-12-16 LAB — POCT HEMOGLOBIN-HEMACUE: Hemoglobin: 12.2 g/dL (ref 12.0–15.0)

## 2018-12-16 MED ORDER — DARBEPOETIN ALFA 40 MCG/0.4ML IJ SOSY: 40.0000 ug | PREFILLED_SYRINGE | INTRAMUSCULAR | Status: DC

## 2018-12-30 ENCOUNTER — Ambulatory Visit (HOSPITAL_COMMUNITY)
Admission: RE | Admit: 2018-12-30 | Discharge: 2018-12-30 | Disposition: A | Discharge status: Home / Self Care | Payer: Medicare Other | Source: Ambulatory Visit | Attending: Nephrology | Admitting: Nephrology

## 2018-12-30 ENCOUNTER — Encounter (HOSPITAL_COMMUNITY): Payer: Medicare Other

## 2018-12-30 ENCOUNTER — Other Ambulatory Visit: Payer: Self-pay

## 2018-12-30 VITALS — BP 166/83 | HR 54 | Temp 97.2°F | Resp 16

## 2018-12-30 DIAGNOSIS — N179 Acute kidney failure, unspecified: Secondary | ICD-10-CM | POA: Diagnosis present

## 2018-12-30 LAB — CBC WITH DIFFERENTIAL/PLATELET
Abs Immature Granulocytes: 0.03 10*3/uL (ref 0.00–0.07)
Basophils Absolute: 0 10*3/uL (ref 0.0–0.1)
Basophils Relative: 1 %
Eosinophils Absolute: 0.2 10*3/uL (ref 0.0–0.5)
Eosinophils Relative: 2 %
HCT: 37 % (ref 36.0–46.0)
Hemoglobin: 11.8 g/dL — ABNORMAL LOW (ref 12.0–15.0)
Immature Granulocytes: 0 %
Lymphocytes Relative: 27 %
Lymphs Abs: 2 10*3/uL (ref 0.7–4.0)
MCH: 29.9 pg (ref 26.0–34.0)
MCHC: 31.9 g/dL (ref 30.0–36.0)
MCV: 93.7 fL (ref 80.0–100.0)
Monocytes Absolute: 0.3 10*3/uL (ref 0.1–1.0)
Monocytes Relative: 5 %
Neutro Abs: 4.9 10*3/uL (ref 1.7–7.7)
Neutrophils Relative %: 65 %
Platelets: 195 10*3/uL (ref 150–400)
RBC: 3.95 MIL/uL (ref 3.87–5.11)
RDW: 14 % (ref 11.5–15.5)
WBC: 7.5 10*3/uL (ref 4.0–10.5)
nRBC: 0 % (ref 0.0–0.2)

## 2018-12-30 LAB — POCT HEMOGLOBIN-HEMACUE: Hemoglobin: 11.3 g/dL — ABNORMAL LOW (ref 12.0–15.0)

## 2018-12-30 LAB — COMPREHENSIVE METABOLIC PANEL
ALT: 34 U/L (ref 0–44)
AST: 28 U/L (ref 15–41)
Albumin: 3.2 g/dL — ABNORMAL LOW (ref 3.5–5.0)
Alkaline Phosphatase: 37 U/L — ABNORMAL LOW (ref 38–126)
Anion gap: 8 (ref 5–15)
BUN: 27 mg/dL — ABNORMAL HIGH (ref 8–23)
CO2: 23 mmol/L (ref 22–32)
Calcium: 9.4 mg/dL (ref 8.9–10.3)
Chloride: 110 mmol/L (ref 98–111)
Creatinine, Ser: 3.1 mg/dL — ABNORMAL HIGH (ref 0.44–1.00)
GFR calc Af Amer: 16 mL/min — ABNORMAL LOW (ref >60)
GFR calc non Af Amer: 13 mL/min — ABNORMAL LOW (ref >60)
Glucose, Bld: 82 mg/dL (ref 70–99)
Potassium: 3.8 mmol/L (ref 3.5–5.1)
Sodium: 141 mmol/L (ref 135–145)
Total Bilirubin: 1 mg/dL (ref 0.3–1.2)
Total Protein: 5.7 g/dL — ABNORMAL LOW (ref 6.5–8.1)

## 2018-12-30 LAB — PHOSPHORUS: Phosphorus: 5 mg/dL — ABNORMAL HIGH (ref 2.5–4.6)

## 2018-12-30 MED ORDER — DARBEPOETIN ALFA 40 MCG/0.4ML IJ SOSY
PREFILLED_SYRINGE | INTRAMUSCULAR | Status: AC
  Administered 2018-12-30: 40 ug via SUBCUTANEOUS
  Filled 2018-12-30: qty 0.4

## 2018-12-30 MED ORDER — DARBEPOETIN ALFA 40 MCG/0.4ML IJ SOSY
40.0000 ug | PREFILLED_SYRINGE | INTRAMUSCULAR | Status: DC
  Administered 2018-12-30: 14:00:00 40 ug via SUBCUTANEOUS

## 2019-01-13 ENCOUNTER — Encounter (HOSPITAL_COMMUNITY): Payer: Medicare Other

## 2019-01-27 ENCOUNTER — Ambulatory Visit (HOSPITAL_COMMUNITY)
Admission: RE | Admit: 2019-01-27 | Discharge: 2019-01-27 | Disposition: A | Discharge status: Home / Self Care | Payer: Medicare Other | Source: Ambulatory Visit | Attending: Nephrology | Admitting: Nephrology

## 2019-01-27 ENCOUNTER — Other Ambulatory Visit: Payer: Self-pay

## 2019-01-27 VITALS — BP 176/100 | HR 56 | Temp 97.6°F | Resp 18

## 2019-01-27 DIAGNOSIS — D631 Anemia in chronic kidney disease: Secondary | ICD-10-CM | POA: Insufficient documentation

## 2019-01-27 DIAGNOSIS — N185 Chronic kidney disease, stage 5: Secondary | ICD-10-CM | POA: Insufficient documentation

## 2019-01-27 DIAGNOSIS — N179 Acute kidney failure, unspecified: Secondary | ICD-10-CM

## 2019-01-27 LAB — COMPREHENSIVE METABOLIC PANEL
ALT: 42 U/L (ref 0–44)
AST: 31 U/L (ref 15–41)
Albumin: 3 g/dL — ABNORMAL LOW (ref 3.5–5.0)
Alkaline Phosphatase: 45 U/L (ref 38–126)
Anion gap: 9 (ref 5–15)
BUN: 27 mg/dL — ABNORMAL HIGH (ref 8–23)
CO2: 25 mmol/L (ref 22–32)
Calcium: 9.9 mg/dL (ref 8.9–10.3)
Chloride: 109 mmol/L (ref 98–111)
Creatinine, Ser: 3.25 mg/dL — ABNORMAL HIGH (ref 0.44–1.00)
GFR calc Af Amer: 15 mL/min — ABNORMAL LOW (ref >60)
GFR calc non Af Amer: 13 mL/min — ABNORMAL LOW (ref >60)
Glucose, Bld: 110 mg/dL — ABNORMAL HIGH (ref 70–99)
Potassium: 3.4 mmol/L — ABNORMAL LOW (ref 3.5–5.1)
Sodium: 143 mmol/L (ref 135–145)
Total Bilirubin: 0.9 mg/dL (ref 0.3–1.2)
Total Protein: 5.8 g/dL — ABNORMAL LOW (ref 6.5–8.1)

## 2019-01-27 LAB — CBC WITH DIFFERENTIAL/PLATELET
Abs Immature Granulocytes: 0.01 10*3/uL (ref 0.00–0.07)
Basophils Absolute: 0.1 10*3/uL (ref 0.0–0.1)
Basophils Relative: 1 %
Eosinophils Absolute: 0.1 10*3/uL (ref 0.0–0.5)
Eosinophils Relative: 2 %
HCT: 36.8 % (ref 36.0–46.0)
Hemoglobin: 11.7 g/dL — ABNORMAL LOW (ref 12.0–15.0)
Immature Granulocytes: 0 %
Lymphocytes Relative: 23 %
Lymphs Abs: 1.7 10*3/uL (ref 0.7–4.0)
MCH: 29.5 pg (ref 26.0–34.0)
MCHC: 31.8 g/dL (ref 30.0–36.0)
MCV: 92.7 fL (ref 80.0–100.0)
Monocytes Absolute: 0.3 10*3/uL (ref 0.1–1.0)
Monocytes Relative: 5 %
Neutro Abs: 5.2 10*3/uL (ref 1.7–7.7)
Neutrophils Relative %: 69 %
Platelets: 205 10*3/uL (ref 150–400)
RBC: 3.97 MIL/uL (ref 3.87–5.11)
RDW: 14.2 % (ref 11.5–15.5)
WBC: 7.4 10*3/uL (ref 4.0–10.5)
nRBC: 0 % (ref 0.0–0.2)

## 2019-01-27 LAB — PHOSPHORUS: Phosphorus: 4.7 mg/dL — ABNORMAL HIGH (ref 2.5–4.6)

## 2019-01-27 LAB — IRON AND TIBC
Iron: 57 ug/dL (ref 28–170)
Saturation Ratios: 23 % (ref 10.4–31.8)
TIBC: 252 ug/dL (ref 250–450)
UIBC: 195 ug/dL

## 2019-01-27 LAB — POCT HEMOGLOBIN-HEMACUE: Hemoglobin: 11.9 g/dL — ABNORMAL LOW (ref 12.0–15.0)

## 2019-01-27 LAB — FERRITIN: Ferritin: 229 ng/mL (ref 11–307)

## 2019-01-27 MED ORDER — DARBEPOETIN ALFA 40 MCG/0.4ML IJ SOSY
PREFILLED_SYRINGE | INTRAMUSCULAR | Status: AC
  Filled 2019-01-27: qty 0.4

## 2019-01-27 MED ORDER — DARBEPOETIN ALFA 40 MCG/0.4ML IJ SOSY
40.0000 ug | PREFILLED_SYRINGE | INTRAMUSCULAR | Status: DC
  Administered 2019-01-27: 40 ug via SUBCUTANEOUS

## 2019-02-24 ENCOUNTER — Encounter (HOSPITAL_COMMUNITY): Payer: Medicare Other

## 2019-03-04 ENCOUNTER — Other Ambulatory Visit: Payer: Self-pay

## 2019-03-04 ENCOUNTER — Ambulatory Visit (HOSPITAL_COMMUNITY)
Admission: RE | Admit: 2019-03-04 | Discharge: 2019-03-04 | Disposition: A | Discharge status: Home / Self Care | Payer: Medicare PPO | Source: Ambulatory Visit | Attending: Nephrology | Admitting: Nephrology

## 2019-03-04 VITALS — BP 169/66 | HR 74 | Temp 96.3°F | Resp 20

## 2019-03-04 DIAGNOSIS — N179 Acute kidney failure, unspecified: Secondary | ICD-10-CM | POA: Diagnosis not present

## 2019-03-04 LAB — COMPREHENSIVE METABOLIC PANEL
ALT: 63 U/L — ABNORMAL HIGH (ref 0–44)
AST: 37 U/L (ref 15–41)
Albumin: 2.9 g/dL — ABNORMAL LOW (ref 3.5–5.0)
Alkaline Phosphatase: 60 U/L (ref 38–126)
Anion gap: 9 (ref 5–15)
BUN: 44 mg/dL — ABNORMAL HIGH (ref 8–23)
CO2: 25 mmol/L (ref 22–32)
Calcium: 9.7 mg/dL (ref 8.9–10.3)
Chloride: 105 mmol/L (ref 98–111)
Creatinine, Ser: 3.35 mg/dL — ABNORMAL HIGH (ref 0.44–1.00)
GFR calc Af Amer: 14 mL/min — ABNORMAL LOW (ref >60)
GFR calc non Af Amer: 12 mL/min — ABNORMAL LOW (ref >60)
Glucose, Bld: 173 mg/dL — ABNORMAL HIGH (ref 70–99)
Potassium: 3.7 mmol/L (ref 3.5–5.1)
Sodium: 139 mmol/L (ref 135–145)
Total Bilirubin: 0.7 mg/dL (ref 0.3–1.2)
Total Protein: 5.7 g/dL — ABNORMAL LOW (ref 6.5–8.1)

## 2019-03-04 LAB — CBC WITH DIFFERENTIAL/PLATELET
Abs Immature Granulocytes: 0.03 10*3/uL (ref 0.00–0.07)
Basophils Absolute: 0.1 10*3/uL (ref 0.0–0.1)
Basophils Relative: 1 %
Eosinophils Absolute: 0.2 10*3/uL (ref 0.0–0.5)
Eosinophils Relative: 2 %
HCT: 39.1 % (ref 36.0–46.0)
Hemoglobin: 12 g/dL (ref 12.0–15.0)
Immature Granulocytes: 0 %
Lymphocytes Relative: 20 %
Lymphs Abs: 1.4 10*3/uL (ref 0.7–4.0)
MCH: 29 pg (ref 26.0–34.0)
MCHC: 30.7 g/dL (ref 30.0–36.0)
MCV: 94.4 fL (ref 80.0–100.0)
Monocytes Absolute: 0.5 10*3/uL (ref 0.1–1.0)
Monocytes Relative: 7 %
Neutro Abs: 5 10*3/uL (ref 1.7–7.7)
Neutrophils Relative %: 70 %
Platelets: 222 10*3/uL (ref 150–400)
RBC: 4.14 MIL/uL (ref 3.87–5.11)
RDW: 14.4 % (ref 11.5–15.5)
WBC: 7.1 10*3/uL (ref 4.0–10.5)
nRBC: 0 % (ref 0.0–0.2)

## 2019-03-04 LAB — PHOSPHORUS: Phosphorus: 4.9 mg/dL — ABNORMAL HIGH (ref 2.5–4.6)

## 2019-03-04 MED ORDER — DARBEPOETIN ALFA 40 MCG/0.4ML IJ SOSY
PREFILLED_SYRINGE | INTRAMUSCULAR | Status: AC
  Administered 2019-03-04: 40 ug via SUBCUTANEOUS
  Filled 2019-03-04: qty 0.4

## 2019-03-04 MED ORDER — DARBEPOETIN ALFA 40 MCG/0.4ML IJ SOSY: 40.0000 ug | PREFILLED_SYRINGE | INTRAMUSCULAR | Status: DC

## 2019-03-05 ENCOUNTER — Other Ambulatory Visit (HOSPITAL_COMMUNITY)
Admission: RE | Admit: 2019-03-05 | Discharge: 2019-03-05 | Disposition: A | Discharge status: Home / Self Care | Payer: Medicare PPO | Source: Other Acute Inpatient Hospital | Attending: Nephrology | Admitting: Nephrology

## 2019-03-05 DIAGNOSIS — D631 Anemia in chronic kidney disease: Secondary | ICD-10-CM | POA: Diagnosis not present

## 2019-03-05 DIAGNOSIS — N184 Chronic kidney disease, stage 4 (severe): Secondary | ICD-10-CM | POA: Insufficient documentation

## 2019-03-05 LAB — PTH, INTACT AND CALCIUM
Calcium, Total (PTH): 9.6 mg/dL (ref 8.7–10.3)
PTH: 23 pg/mL (ref 15–65)

## 2019-03-05 LAB — CBC WITH DIFFERENTIAL/PLATELET
Abs Immature Granulocytes: 0.02 K/uL (ref 0.00–0.07)
Basophils Absolute: 0.1 K/uL (ref 0.0–0.1)
Basophils Relative: 1 %
Eosinophils Absolute: 0.2 K/uL (ref 0.0–0.5)
Eosinophils Relative: 3 %
HCT: 39.5 % (ref 36.0–46.0)
Hemoglobin: 12.8 g/dL (ref 12.0–15.0)
Immature Granulocytes: 0 %
Lymphocytes Relative: 19 %
Lymphs Abs: 1.5 K/uL (ref 0.7–4.0)
MCH: 30 pg (ref 26.0–34.0)
MCHC: 32.4 g/dL (ref 30.0–36.0)
MCV: 92.5 fL (ref 80.0–100.0)
Monocytes Absolute: 0.5 K/uL (ref 0.1–1.0)
Monocytes Relative: 6 %
Neutro Abs: 5.7 K/uL (ref 1.7–7.7)
Neutrophils Relative %: 71 %
Platelets: 248 K/uL (ref 150–400)
RBC: 4.27 MIL/uL (ref 3.87–5.11)
RDW: 14.5 % (ref 11.5–15.5)
WBC: 8 K/uL (ref 4.0–10.5)
nRBC: 0 % (ref 0.0–0.2)

## 2019-03-05 LAB — C-REACTIVE PROTEIN: CRP: 0.9 mg/dL (ref <1.0)

## 2019-03-05 LAB — SEDIMENTATION RATE: Sed Rate: 33 mm/hr — ABNORMAL HIGH (ref 0–22)

## 2019-03-05 LAB — POCT HEMOGLOBIN-HEMACUE: Hemoglobin: 11.6 g/dL — ABNORMAL LOW (ref 12.0–15.0)

## 2019-04-01 ENCOUNTER — Other Ambulatory Visit: Payer: Self-pay

## 2019-04-01 ENCOUNTER — Ambulatory Visit (HOSPITAL_COMMUNITY)
Admission: RE | Admit: 2019-04-01 | Discharge: 2019-04-01 | Disposition: A | Discharge status: Home / Self Care | Payer: Medicare PPO | Source: Ambulatory Visit | Attending: Nephrology | Admitting: Nephrology

## 2019-04-01 VITALS — BP 152/79 | HR 54 | Temp 96.1°F | Resp 20

## 2019-04-01 DIAGNOSIS — D631 Anemia in chronic kidney disease: Secondary | ICD-10-CM | POA: Diagnosis not present

## 2019-04-01 DIAGNOSIS — N185 Chronic kidney disease, stage 5: Secondary | ICD-10-CM | POA: Insufficient documentation

## 2019-04-01 DIAGNOSIS — N179 Acute kidney failure, unspecified: Secondary | ICD-10-CM

## 2019-04-01 LAB — CBC WITH DIFFERENTIAL/PLATELET
Abs Immature Granulocytes: 0.02 10*3/uL (ref 0.00–0.07)
Basophils Absolute: 0.1 10*3/uL (ref 0.0–0.1)
Basophils Relative: 1 %
Eosinophils Absolute: 0.2 10*3/uL (ref 0.0–0.5)
Eosinophils Relative: 2 %
HCT: 38.9 % (ref 36.0–46.0)
Hemoglobin: 12.3 g/dL (ref 12.0–15.0)
Immature Granulocytes: 0 %
Lymphocytes Relative: 23 %
Lymphs Abs: 1.7 10*3/uL (ref 0.7–4.0)
MCH: 29.4 pg (ref 26.0–34.0)
MCHC: 31.6 g/dL (ref 30.0–36.0)
MCV: 93.1 fL (ref 80.0–100.0)
Monocytes Absolute: 0.3 10*3/uL (ref 0.1–1.0)
Monocytes Relative: 5 %
Neutro Abs: 5.1 10*3/uL (ref 1.7–7.7)
Neutrophils Relative %: 69 %
Platelets: 213 10*3/uL (ref 150–400)
RBC: 4.18 MIL/uL (ref 3.87–5.11)
RDW: 14.1 % (ref 11.5–15.5)
WBC: 7.4 10*3/uL (ref 4.0–10.5)
nRBC: 0 % (ref 0.0–0.2)

## 2019-04-01 LAB — COMPREHENSIVE METABOLIC PANEL
ALT: 33 U/L (ref 0–44)
AST: 29 U/L (ref 15–41)
Albumin: 3 g/dL — ABNORMAL LOW (ref 3.5–5.0)
Alkaline Phosphatase: 52 U/L (ref 38–126)
Anion gap: 7 (ref 5–15)
BUN: 23 mg/dL (ref 8–23)
CO2: 24 mmol/L (ref 22–32)
Calcium: 9.2 mg/dL (ref 8.9–10.3)
Chloride: 109 mmol/L (ref 98–111)
Creatinine, Ser: 3.3 mg/dL — ABNORMAL HIGH (ref 0.44–1.00)
GFR calc Af Amer: 14 mL/min — ABNORMAL LOW (ref >60)
GFR calc non Af Amer: 12 mL/min — ABNORMAL LOW (ref >60)
Glucose, Bld: 143 mg/dL — ABNORMAL HIGH (ref 70–99)
Potassium: 4 mmol/L (ref 3.5–5.1)
Sodium: 140 mmol/L (ref 135–145)
Total Bilirubin: 0.8 mg/dL (ref 0.3–1.2)
Total Protein: 5.7 g/dL — ABNORMAL LOW (ref 6.5–8.1)

## 2019-04-01 LAB — PHOSPHORUS: Phosphorus: 5.3 mg/dL — ABNORMAL HIGH (ref 2.5–4.6)

## 2019-04-01 LAB — POCT HEMOGLOBIN-HEMACUE: Hemoglobin: 11.8 g/dL — ABNORMAL LOW (ref 12.0–15.0)

## 2019-04-01 LAB — IRON AND TIBC
Iron: 69 ug/dL (ref 28–170)
Saturation Ratios: 26 % (ref 10.4–31.8)
TIBC: 262 ug/dL (ref 250–450)
UIBC: 193 ug/dL

## 2019-04-01 LAB — FERRITIN: Ferritin: 192 ng/mL (ref 11–307)

## 2019-04-01 MED ORDER — DARBEPOETIN ALFA 40 MCG/0.4ML IJ SOSY
PREFILLED_SYRINGE | INTRAMUSCULAR | Status: AC
  Administered 2019-04-01: 14:00:00 40 ug via SUBCUTANEOUS
  Filled 2019-04-01: qty 0.4

## 2019-04-01 MED ORDER — DARBEPOETIN ALFA 40 MCG/0.4ML IJ SOSY: 40.0000 ug | PREFILLED_SYRINGE | INTRAMUSCULAR | Status: DC

## 2019-04-29 ENCOUNTER — Encounter (HOSPITAL_COMMUNITY): Payer: Medicare PPO

## 2019-04-30 ENCOUNTER — Other Ambulatory Visit: Payer: Self-pay

## 2019-05-04 ENCOUNTER — Encounter: Payer: Self-pay | Admitting: *Deleted

## 2019-05-07 ENCOUNTER — Telehealth: Payer: Self-pay | Admitting: *Deleted

## 2019-05-07 ENCOUNTER — Telehealth: Payer: Self-pay | Admitting: Physician Assistant

## 2019-05-07 ENCOUNTER — Encounter (HOSPITAL_COMMUNITY)
Admission: RE | Admit: 2019-05-07 | Discharge: 2019-05-07 | Disposition: A | Discharge status: Home / Self Care | Payer: Medicare PPO | Source: Ambulatory Visit | Attending: Nephrology | Admitting: Nephrology

## 2019-05-07 VITALS — BP 155/79 | HR 65 | Temp 96.7°F | Resp 20

## 2019-05-07 DIAGNOSIS — N179 Acute kidney failure, unspecified: Secondary | ICD-10-CM | POA: Diagnosis not present

## 2019-05-07 LAB — CBC WITH DIFFERENTIAL/PLATELET
Abs Immature Granulocytes: 0.04 10*3/uL (ref 0.00–0.07)
Basophils Absolute: 0.1 10*3/uL (ref 0.0–0.1)
Basophils Relative: 1 %
Eosinophils Absolute: 0.1 10*3/uL (ref 0.0–0.5)
Eosinophils Relative: 2 %
HCT: 40.9 % (ref 36.0–46.0)
Hemoglobin: 12.7 g/dL (ref 12.0–15.0)
Immature Granulocytes: 1 %
Lymphocytes Relative: 22 %
Lymphs Abs: 1.8 10*3/uL (ref 0.7–4.0)
MCH: 28.9 pg (ref 26.0–34.0)
MCHC: 31.1 g/dL (ref 30.0–36.0)
MCV: 93 fL (ref 80.0–100.0)
Monocytes Absolute: 0.4 10*3/uL (ref 0.1–1.0)
Monocytes Relative: 4 %
Neutro Abs: 5.7 10*3/uL (ref 1.7–7.7)
Neutrophils Relative %: 70 %
Platelets: 249 10*3/uL (ref 150–400)
RBC: 4.4 MIL/uL (ref 3.87–5.11)
RDW: 14.3 % (ref 11.5–15.5)
WBC: 8.1 10*3/uL (ref 4.0–10.5)
nRBC: 0 % (ref 0.0–0.2)

## 2019-05-07 LAB — COMPREHENSIVE METABOLIC PANEL
ALT: 30 U/L (ref 0–44)
AST: 30 U/L (ref 15–41)
Albumin: 3.2 g/dL — ABNORMAL LOW (ref 3.5–5.0)
Alkaline Phosphatase: 56 U/L (ref 38–126)
Anion gap: 11 (ref 5–15)
BUN: 26 mg/dL — ABNORMAL HIGH (ref 8–23)
CO2: 24 mmol/L (ref 22–32)
Calcium: 10.1 mg/dL (ref 8.9–10.3)
Chloride: 108 mmol/L (ref 98–111)
Creatinine, Ser: 3.2 mg/dL — ABNORMAL HIGH (ref 0.44–1.00)
GFR calc Af Amer: 15 mL/min — ABNORMAL LOW (ref >60)
GFR calc non Af Amer: 13 mL/min — ABNORMAL LOW (ref >60)
Glucose, Bld: 107 mg/dL — ABNORMAL HIGH (ref 70–99)
Potassium: 4.2 mmol/L (ref 3.5–5.1)
Sodium: 143 mmol/L (ref 135–145)
Total Bilirubin: 0.9 mg/dL (ref 0.3–1.2)
Total Protein: 5.9 g/dL — ABNORMAL LOW (ref 6.5–8.1)

## 2019-05-07 LAB — PHOSPHORUS: Phosphorus: 5.8 mg/dL — ABNORMAL HIGH (ref 2.5–4.6)

## 2019-05-07 LAB — POCT HEMOGLOBIN-HEMACUE: Hemoglobin: 12.7 g/dL (ref 12.0–15.0)

## 2019-05-07 MED ORDER — EPOETIN ALFA-EPBX 10000 UNIT/ML IJ SOLN: 10000.0000 [IU] | INTRAMUSCULAR | Status: DC

## 2019-05-07 NOTE — Telephone Encounter (Signed)
Informed patient of pathology report

## 2019-05-07 NOTE — Telephone Encounter (Signed)
Patient calling about pathology results from last visit.  She said that [facility] told her that she would go over results with her.  Chart # [id number]

## 2019-05-11 ENCOUNTER — Telehealth: Payer: Self-pay | Admitting: Physician Assistant

## 2019-05-11 NOTE — Telephone Encounter (Signed)
Spoke to patient about her itching. She is continuing to make changes at home and I discussed that her metoprolol could make her itch. I still plan to discuss her case with Dr Denna Haggard to see if he has any suggestions for treatment including UVB treatment.

## 2019-05-11 NOTE — Telephone Encounter (Signed)
[facility] requested me to call patients insurance to get a prior authorization on UVB treatment (cpt code [id number]). I spoke with Judson Roch from [facility] and she confirmed NO prior authorization needed for this treatment. Reference# [id number]. Provider aware.

## 2019-05-13 ENCOUNTER — Other Ambulatory Visit: Payer: Self-pay | Admitting: Physician Assistant

## 2019-05-13 ENCOUNTER — Telehealth: Payer: Self-pay | Admitting: *Deleted

## 2019-05-13 NOTE — Telephone Encounter (Signed)
Phone call to patient to give her Kim Burns's recommendations.  Voicemail left for patient to give [place] a call back.

## 2019-05-13 NOTE — Telephone Encounter (Signed)
-----   Message from Arlyss Gandy, PA-C sent at 05/13/2019  9:00 AM EDT ----- Regarding: UVB treatment Discussed with ST about UVB. Thinks it is a good option. Torrie Mayers called and her insurance will approve it. Call to see if she is interested. Two treatments a week. ST will need to see her to decide on Joules/starting time.

## 2019-05-13 NOTE — Telephone Encounter (Signed)
Left message for patient. We need to make appointment with Dr. Denna Haggard to discuss the light box.

## 2019-05-14 NOTE — Telephone Encounter (Signed)
Returning call, but she's not sure why she was called

## 2019-05-17 ENCOUNTER — Telehealth: Payer: Self-pay

## 2019-05-17 NOTE — Telephone Encounter (Signed)
Spoke with patient about [facility]'s recommendation. Patient is willing to try UVB and scheduled appointment with ST on April 15th.

## 2019-05-17 NOTE — Telephone Encounter (Signed)
Detailed voicemail left on patient's cell phone telling her that [facility] discussed UVB treatment with Dr. Denna Haggard and he feels like it would be a good option.  UVB is approved by her insurance.  If she is still interested she will need to schedule an appointment with Dr. Denna Haggard to decide on Joules/starting time.

## 2019-05-21 ENCOUNTER — Encounter (HOSPITAL_COMMUNITY)
Admission: RE | Admit: 2019-05-21 | Discharge: 2019-05-21 | Disposition: A | Discharge status: Home / Self Care | Payer: Medicare PPO | Source: Ambulatory Visit | Attending: Nephrology | Admitting: Nephrology

## 2019-05-21 ENCOUNTER — Other Ambulatory Visit: Payer: Self-pay

## 2019-05-21 VITALS — BP 160/76 | HR 63 | Temp 95.9°F | Resp 20

## 2019-05-21 DIAGNOSIS — D631 Anemia in chronic kidney disease: Secondary | ICD-10-CM | POA: Diagnosis not present

## 2019-05-21 DIAGNOSIS — N179 Acute kidney failure, unspecified: Secondary | ICD-10-CM

## 2019-05-21 DIAGNOSIS — N185 Chronic kidney disease, stage 5: Secondary | ICD-10-CM | POA: Diagnosis not present

## 2019-05-21 LAB — IRON AND TIBC
Iron: 83 ug/dL (ref 28–170)
Saturation Ratios: 32 % — ABNORMAL HIGH (ref 10.4–31.8)
TIBC: 263 ug/dL (ref 250–450)
UIBC: 180 ug/dL

## 2019-05-21 LAB — FERRITIN: Ferritin: 239 ng/mL (ref 11–307)

## 2019-05-21 LAB — POCT HEMOGLOBIN-HEMACUE: Hemoglobin: 11.8 g/dL — ABNORMAL LOW (ref 12.0–15.0)

## 2019-05-21 MED ORDER — EPOETIN ALFA-EPBX 10000 UNIT/ML IJ SOLN
10000.0000 [IU] | INTRAMUSCULAR | Status: DC
  Administered 2019-05-21: 10000 [IU] via SUBCUTANEOUS

## 2019-05-21 MED ORDER — EPOETIN ALFA-EPBX 10000 UNIT/ML IJ SOLN
INTRAMUSCULAR | Status: AC
  Filled 2019-05-21: qty 1

## 2019-05-22 LAB — PTH, INTACT AND CALCIUM
Calcium, Total (PTH): 9.5 mg/dL (ref 8.7–10.3)
PTH: 19 pg/mL (ref 15–65)

## 2019-05-27 DIAGNOSIS — I129 Hypertensive chronic kidney disease with stage 1 through stage 4 chronic kidney disease, or unspecified chronic kidney disease: Secondary | ICD-10-CM | POA: Diagnosis not present

## 2019-05-27 DIAGNOSIS — E891 Postprocedural hypoinsulinemia: Secondary | ICD-10-CM | POA: Diagnosis not present

## 2019-05-27 DIAGNOSIS — N179 Acute kidney failure, unspecified: Secondary | ICD-10-CM | POA: Diagnosis not present

## 2019-05-27 DIAGNOSIS — E139 Other specified diabetes mellitus without complications: Secondary | ICD-10-CM | POA: Diagnosis not present

## 2019-05-27 DIAGNOSIS — N200 Calculus of kidney: Secondary | ICD-10-CM | POA: Diagnosis not present

## 2019-05-27 DIAGNOSIS — E785 Hyperlipidemia, unspecified: Secondary | ICD-10-CM | POA: Diagnosis not present

## 2019-05-27 DIAGNOSIS — N2581 Secondary hyperparathyroidism of renal origin: Secondary | ICD-10-CM | POA: Diagnosis not present

## 2019-05-27 DIAGNOSIS — N185 Chronic kidney disease, stage 5: Secondary | ICD-10-CM | POA: Diagnosis not present

## 2019-06-03 ENCOUNTER — Encounter: Payer: Self-pay | Admitting: Dermatology

## 2019-06-03 ENCOUNTER — Other Ambulatory Visit: Payer: Self-pay

## 2019-06-03 ENCOUNTER — Ambulatory Visit: Payer: Medicare PPO | Admitting: Dermatology

## 2019-06-03 DIAGNOSIS — L298 Other pruritus: Secondary | ICD-10-CM | POA: Diagnosis not present

## 2019-06-03 DIAGNOSIS — L299 Pruritus, unspecified: Secondary | ICD-10-CM

## 2019-06-04 ENCOUNTER — Encounter (HOSPITAL_COMMUNITY): Payer: Medicare PPO

## 2019-06-06 ENCOUNTER — Encounter: Payer: Self-pay | Admitting: Dermatology

## 2019-06-06 NOTE — Progress Notes (Signed)
   Follow-Up Visit   Subjective  Kim Burns is a 84 y.o. female who presents for the following: Follow-up (Here to discuss UVB therapy for uremic pruritis.  Previously seen by Arlyss Gandy.).  pruritus Location: everywhere Duration: Months Quality: Improved Associated Signs/Symptoms: Modifying Factors: Phosphate lowering medication Severity:  Timing: Context:.  To discuss ultraviolet therapy  The following portions of the chart were reviewed this encounter and updated as appropriate: Tobacco  Allergies  Meds  Problems  Med Hx  Surg Hx  Fam Hx      Objective  Well appearing patient in no apparent distress; mood and affect are within normal limits.  A focused examination was performed including face, neck, arms.. Relevant physical exam findings are noted in the Assessment and Plan.  Brief skin exam, no primary skin lesion found. Assessment & Plan  Pruritus (7) Left Elbow - Posterior; Left Popliteal Fossa; Right Popliteal Fossa; Right Forearm - Posterior; Right Upper Back; Left Lower Back; Right Lower Back  Defer light therapy.

## 2019-06-07 DIAGNOSIS — K639 Disease of intestine, unspecified: Secondary | ICD-10-CM | POA: Diagnosis not present

## 2019-06-18 ENCOUNTER — Encounter (HOSPITAL_COMMUNITY)
Admission: RE | Admit: 2019-06-18 | Discharge: 2019-06-18 | Disposition: A | Discharge status: Home / Self Care | Payer: Medicare PPO | Source: Ambulatory Visit | Attending: Nephrology | Admitting: Nephrology

## 2019-06-18 ENCOUNTER — Other Ambulatory Visit: Payer: Self-pay

## 2019-06-18 VITALS — BP 138/79 | HR 70 | Temp 97.1°F | Resp 20

## 2019-06-18 DIAGNOSIS — D631 Anemia in chronic kidney disease: Secondary | ICD-10-CM | POA: Diagnosis not present

## 2019-06-18 DIAGNOSIS — N185 Chronic kidney disease, stage 5: Secondary | ICD-10-CM | POA: Diagnosis not present

## 2019-06-18 DIAGNOSIS — N179 Acute kidney failure, unspecified: Secondary | ICD-10-CM

## 2019-06-18 LAB — CBC WITH DIFFERENTIAL/PLATELET
Abs Immature Granulocytes: 0.02 10*3/uL (ref 0.00–0.07)
Basophils Absolute: 0 10*3/uL (ref 0.0–0.1)
Basophils Relative: 1 %
Eosinophils Absolute: 0.1 10*3/uL (ref 0.0–0.5)
Eosinophils Relative: 1 %
HCT: 37.5 % (ref 36.0–46.0)
Hemoglobin: 11.6 g/dL — ABNORMAL LOW (ref 12.0–15.0)
Immature Granulocytes: 0 %
Lymphocytes Relative: 23 %
Lymphs Abs: 1.5 10*3/uL (ref 0.7–4.0)
MCH: 29.4 pg (ref 26.0–34.0)
MCHC: 30.9 g/dL (ref 30.0–36.0)
MCV: 95.2 fL (ref 80.0–100.0)
Monocytes Absolute: 0.4 10*3/uL (ref 0.1–1.0)
Monocytes Relative: 6 %
Neutro Abs: 4.7 10*3/uL (ref 1.7–7.7)
Neutrophils Relative %: 69 %
Platelets: 223 10*3/uL (ref 150–400)
RBC: 3.94 MIL/uL (ref 3.87–5.11)
RDW: 14.2 % (ref 11.5–15.5)
WBC: 6.7 10*3/uL (ref 4.0–10.5)
nRBC: 0 % (ref 0.0–0.2)

## 2019-06-18 LAB — COMPREHENSIVE METABOLIC PANEL
ALT: 35 U/L (ref 0–44)
AST: 33 U/L (ref 15–41)
Albumin: 3 g/dL — ABNORMAL LOW (ref 3.5–5.0)
Alkaline Phosphatase: 58 U/L (ref 38–126)
Anion gap: 7 (ref 5–15)
BUN: 44 mg/dL — ABNORMAL HIGH (ref 8–23)
CO2: 25 mmol/L (ref 22–32)
Calcium: 9.1 mg/dL (ref 8.9–10.3)
Chloride: 108 mmol/L (ref 98–111)
Creatinine, Ser: 3.59 mg/dL — ABNORMAL HIGH (ref 0.44–1.00)
GFR calc Af Amer: 13 mL/min — ABNORMAL LOW (ref >60)
GFR calc non Af Amer: 11 mL/min — ABNORMAL LOW (ref >60)
Glucose, Bld: 225 mg/dL — ABNORMAL HIGH (ref 70–99)
Potassium: 4.8 mmol/L (ref 3.5–5.1)
Sodium: 140 mmol/L (ref 135–145)
Total Bilirubin: 0.7 mg/dL (ref 0.3–1.2)
Total Protein: 5.5 g/dL — ABNORMAL LOW (ref 6.5–8.1)

## 2019-06-18 LAB — POCT HEMOGLOBIN-HEMACUE: Hemoglobin: 12.1 g/dL (ref 12.0–15.0)

## 2019-06-18 LAB — PHOSPHORUS: Phosphorus: 5.5 mg/dL — ABNORMAL HIGH (ref 2.5–4.6)

## 2019-06-18 MED ORDER — EPOETIN ALFA-EPBX 10000 UNIT/ML IJ SOLN: 10000.0000 [IU] | INTRAMUSCULAR | Status: DC

## 2019-07-02 ENCOUNTER — Other Ambulatory Visit: Payer: Self-pay

## 2019-07-02 ENCOUNTER — Ambulatory Visit (HOSPITAL_COMMUNITY)
Admission: RE | Admit: 2019-07-02 | Discharge: 2019-07-02 | Disposition: A | Discharge status: Home / Self Care | Payer: Medicare PPO | Source: Ambulatory Visit | Attending: Nephrology | Admitting: Nephrology

## 2019-07-02 VITALS — BP 133/69 | HR 68 | Temp 96.8°F | Resp 20

## 2019-07-02 DIAGNOSIS — N179 Acute kidney failure, unspecified: Secondary | ICD-10-CM

## 2019-07-02 LAB — CBC WITH DIFFERENTIAL/PLATELET
Abs Immature Granulocytes: 0.03 10*3/uL (ref 0.00–0.07)
Basophils Absolute: 0.1 10*3/uL (ref 0.0–0.1)
Basophils Relative: 1 %
Eosinophils Absolute: 0.2 10*3/uL (ref 0.0–0.5)
Eosinophils Relative: 2 %
HCT: 36.4 % (ref 36.0–46.0)
Hemoglobin: 11.7 g/dL — ABNORMAL LOW (ref 12.0–15.0)
Immature Granulocytes: 0 %
Lymphocytes Relative: 19 %
Lymphs Abs: 1.7 10*3/uL (ref 0.7–4.0)
MCH: 30.2 pg (ref 26.0–34.0)
MCHC: 32.1 g/dL (ref 30.0–36.0)
MCV: 94.1 fL (ref 80.0–100.0)
Monocytes Absolute: 0.5 10*3/uL (ref 0.1–1.0)
Monocytes Relative: 5 %
Neutro Abs: 6.6 10*3/uL (ref 1.7–7.7)
Neutrophils Relative %: 73 %
Platelets: 212 10*3/uL (ref 150–400)
RBC: 3.87 MIL/uL (ref 3.87–5.11)
RDW: 14.6 % (ref 11.5–15.5)
WBC: 9.1 10*3/uL (ref 4.0–10.5)
nRBC: 0 % (ref 0.0–0.2)

## 2019-07-02 LAB — COMPREHENSIVE METABOLIC PANEL
ALT: 28 U/L (ref 0–44)
AST: 23 U/L (ref 15–41)
Albumin: 3 g/dL — ABNORMAL LOW (ref 3.5–5.0)
Alkaline Phosphatase: 65 U/L (ref 38–126)
Anion gap: 8 (ref 5–15)
BUN: 35 mg/dL — ABNORMAL HIGH (ref 8–23)
CO2: 23 mmol/L (ref 22–32)
Calcium: 9.6 mg/dL (ref 8.9–10.3)
Chloride: 111 mmol/L (ref 98–111)
Creatinine, Ser: 3.2 mg/dL — ABNORMAL HIGH (ref 0.44–1.00)
GFR calc Af Amer: 15 mL/min — ABNORMAL LOW (ref >60)
GFR calc non Af Amer: 13 mL/min — ABNORMAL LOW (ref >60)
Glucose, Bld: 159 mg/dL — ABNORMAL HIGH (ref 70–99)
Potassium: 4.1 mmol/L (ref 3.5–5.1)
Sodium: 142 mmol/L (ref 135–145)
Total Bilirubin: 0.9 mg/dL (ref 0.3–1.2)
Total Protein: 5.9 g/dL — ABNORMAL LOW (ref 6.5–8.1)

## 2019-07-02 LAB — PHOSPHORUS: Phosphorus: 3.9 mg/dL (ref 2.5–4.6)

## 2019-07-02 LAB — POCT HEMOGLOBIN-HEMACUE: Hemoglobin: 11.8 g/dL — ABNORMAL LOW (ref 12.0–15.0)

## 2019-07-02 MED ORDER — EPOETIN ALFA-EPBX 10000 UNIT/ML IJ SOLN: 10000.0000 [IU] | INTRAMUSCULAR | Status: DC

## 2019-07-02 MED ORDER — EPOETIN ALFA-EPBX 10000 UNIT/ML IJ SOLN
INTRAMUSCULAR | Status: AC
  Administered 2019-07-02: 10000 [IU]
  Filled 2019-07-02: qty 1

## 2019-07-16 ENCOUNTER — Encounter (HOSPITAL_COMMUNITY): Payer: Medicare PPO

## 2019-07-28 DIAGNOSIS — N179 Acute kidney failure, unspecified: Secondary | ICD-10-CM | POA: Diagnosis not present

## 2019-07-28 DIAGNOSIS — N185 Chronic kidney disease, stage 5: Secondary | ICD-10-CM | POA: Diagnosis not present

## 2019-07-28 DIAGNOSIS — N200 Calculus of kidney: Secondary | ICD-10-CM | POA: Diagnosis not present

## 2019-07-28 DIAGNOSIS — N2581 Secondary hyperparathyroidism of renal origin: Secondary | ICD-10-CM | POA: Diagnosis not present

## 2019-07-28 DIAGNOSIS — E785 Hyperlipidemia, unspecified: Secondary | ICD-10-CM | POA: Diagnosis not present

## 2019-07-28 DIAGNOSIS — D631 Anemia in chronic kidney disease: Secondary | ICD-10-CM | POA: Diagnosis not present

## 2019-07-28 DIAGNOSIS — I120 Hypertensive chronic kidney disease with stage 5 chronic kidney disease or end stage renal disease: Secondary | ICD-10-CM | POA: Diagnosis not present

## 2019-07-28 DIAGNOSIS — E139 Other specified diabetes mellitus without complications: Secondary | ICD-10-CM | POA: Diagnosis not present

## 2019-07-28 DIAGNOSIS — M109 Gout, unspecified: Secondary | ICD-10-CM | POA: Diagnosis not present

## 2019-07-30 ENCOUNTER — Ambulatory Visit (HOSPITAL_COMMUNITY)
Admission: RE | Admit: 2019-07-30 | Discharge: 2019-07-30 | Disposition: A | Discharge status: Home / Self Care | Payer: Medicare PPO | Source: Ambulatory Visit | Attending: Nephrology | Admitting: Nephrology

## 2019-07-30 ENCOUNTER — Other Ambulatory Visit: Payer: Self-pay

## 2019-07-30 VITALS — BP 130/66 | HR 53 | Temp 95.1°F | Resp 20

## 2019-07-30 DIAGNOSIS — N185 Chronic kidney disease, stage 5: Secondary | ICD-10-CM | POA: Diagnosis not present

## 2019-07-30 DIAGNOSIS — D631 Anemia in chronic kidney disease: Secondary | ICD-10-CM | POA: Diagnosis not present

## 2019-07-30 DIAGNOSIS — N179 Acute kidney failure, unspecified: Secondary | ICD-10-CM

## 2019-07-30 LAB — CBC WITH DIFFERENTIAL/PLATELET
Abs Immature Granulocytes: 0.03 10*3/uL (ref 0.00–0.07)
Basophils Absolute: 0.1 10*3/uL (ref 0.0–0.1)
Basophils Relative: 1 %
Eosinophils Absolute: 0.2 10*3/uL (ref 0.0–0.5)
Eosinophils Relative: 3 %
HCT: 37.6 % (ref 36.0–46.0)
Hemoglobin: 11.6 g/dL — ABNORMAL LOW (ref 12.0–15.0)
Immature Granulocytes: 0 %
Lymphocytes Relative: 21 %
Lymphs Abs: 1.5 10*3/uL (ref 0.7–4.0)
MCH: 29.7 pg (ref 26.0–34.0)
MCHC: 30.9 g/dL (ref 30.0–36.0)
MCV: 96.4 fL (ref 80.0–100.0)
Monocytes Absolute: 0.4 10*3/uL (ref 0.1–1.0)
Monocytes Relative: 6 %
Neutro Abs: 5.1 10*3/uL (ref 1.7–7.7)
Neutrophils Relative %: 69 %
Platelets: 215 10*3/uL (ref 150–400)
RBC: 3.9 MIL/uL (ref 3.87–5.11)
RDW: 14.4 % (ref 11.5–15.5)
WBC: 7.2 10*3/uL (ref 4.0–10.5)
nRBC: 0 % (ref 0.0–0.2)

## 2019-07-30 LAB — COMPREHENSIVE METABOLIC PANEL
ALT: 35 U/L (ref 0–44)
AST: 32 U/L (ref 15–41)
Albumin: 3.1 g/dL — ABNORMAL LOW (ref 3.5–5.0)
Alkaline Phosphatase: 75 U/L (ref 38–126)
Anion gap: 8 (ref 5–15)
BUN: 35 mg/dL — ABNORMAL HIGH (ref 8–23)
CO2: 23 mmol/L (ref 22–32)
Calcium: 9.1 mg/dL (ref 8.9–10.3)
Chloride: 111 mmol/L (ref 98–111)
Creatinine, Ser: 3.45 mg/dL — ABNORMAL HIGH (ref 0.44–1.00)
GFR calc Af Amer: 14 mL/min — ABNORMAL LOW (ref >60)
GFR calc non Af Amer: 12 mL/min — ABNORMAL LOW (ref >60)
Glucose, Bld: 117 mg/dL — ABNORMAL HIGH (ref 70–99)
Potassium: 4.3 mmol/L (ref 3.5–5.1)
Sodium: 142 mmol/L (ref 135–145)
Total Bilirubin: 0.7 mg/dL (ref 0.3–1.2)
Total Protein: 5.9 g/dL — ABNORMAL LOW (ref 6.5–8.1)

## 2019-07-30 LAB — POCT HEMOGLOBIN-HEMACUE: Hemoglobin: 11.4 g/dL — ABNORMAL LOW (ref 12.0–15.0)

## 2019-07-30 LAB — IRON AND TIBC
Iron: 72 ug/dL (ref 28–170)
Saturation Ratios: 26 % (ref 10.4–31.8)
TIBC: 274 ug/dL (ref 250–450)
UIBC: 202 ug/dL

## 2019-07-30 LAB — PHOSPHORUS: Phosphorus: 4.8 mg/dL — ABNORMAL HIGH (ref 2.5–4.6)

## 2019-07-30 LAB — FERRITIN: Ferritin: 205 ng/mL (ref 11–307)

## 2019-07-30 MED ORDER — EPOETIN ALFA-EPBX 10000 UNIT/ML IJ SOLN
10000.0000 [IU] | INTRAMUSCULAR | Status: DC
  Administered 2019-07-30: 10000 [IU] via SUBCUTANEOUS

## 2019-07-30 MED ORDER — EPOETIN ALFA-EPBX 10000 UNIT/ML IJ SOLN
INTRAMUSCULAR | Status: AC
  Filled 2019-07-30: qty 1

## 2019-08-04 DIAGNOSIS — Z1331 Encounter for screening for depression: Secondary | ICD-10-CM | POA: Diagnosis not present

## 2019-08-04 DIAGNOSIS — Z1339 Encounter for screening examination for other mental health and behavioral disorders: Secondary | ICD-10-CM | POA: Diagnosis not present

## 2019-08-04 DIAGNOSIS — Z0000 Encounter for general adult medical examination without abnormal findings: Secondary | ICD-10-CM | POA: Diagnosis not present

## 2019-08-10 DIAGNOSIS — Z23 Encounter for immunization: Secondary | ICD-10-CM | POA: Diagnosis not present

## 2019-08-27 ENCOUNTER — Encounter (HOSPITAL_COMMUNITY): Payer: Medicare PPO

## 2019-08-27 DIAGNOSIS — N185 Chronic kidney disease, stage 5: Secondary | ICD-10-CM | POA: Diagnosis not present

## 2019-08-27 DIAGNOSIS — D631 Anemia in chronic kidney disease: Secondary | ICD-10-CM | POA: Diagnosis not present

## 2019-08-27 DIAGNOSIS — I120 Hypertensive chronic kidney disease with stage 5 chronic kidney disease or end stage renal disease: Secondary | ICD-10-CM | POA: Diagnosis not present

## 2019-08-27 DIAGNOSIS — N2581 Secondary hyperparathyroidism of renal origin: Secondary | ICD-10-CM | POA: Diagnosis not present

## 2019-08-30 DIAGNOSIS — E109 Type 1 diabetes mellitus without complications: Secondary | ICD-10-CM | POA: Diagnosis not present

## 2019-08-30 DIAGNOSIS — K8681 Exocrine pancreatic insufficiency: Secondary | ICD-10-CM | POA: Diagnosis not present

## 2019-08-30 DIAGNOSIS — E559 Vitamin D deficiency, unspecified: Secondary | ICD-10-CM | POA: Diagnosis not present

## 2019-08-30 DIAGNOSIS — E876 Hypokalemia: Secondary | ICD-10-CM | POA: Diagnosis not present

## 2019-08-30 DIAGNOSIS — I10 Essential (primary) hypertension: Secondary | ICD-10-CM | POA: Diagnosis not present

## 2019-08-30 DIAGNOSIS — D490 Neoplasm of unspecified behavior of digestive system: Secondary | ICD-10-CM | POA: Diagnosis not present

## 2019-08-30 DIAGNOSIS — R197 Diarrhea, unspecified: Secondary | ICD-10-CM | POA: Diagnosis not present

## 2019-09-01 ENCOUNTER — Other Ambulatory Visit: Payer: Self-pay

## 2019-09-01 ENCOUNTER — Ambulatory Visit (HOSPITAL_COMMUNITY)
Admission: RE | Admit: 2019-09-01 | Discharge: 2019-09-01 | Disposition: A | Discharge status: Home / Self Care | Payer: Medicare PPO | Source: Ambulatory Visit | Attending: Nephrology | Admitting: Nephrology

## 2019-09-01 VITALS — BP 138/65 | HR 68 | Temp 97.8°F | Resp 18

## 2019-09-01 DIAGNOSIS — N179 Acute kidney failure, unspecified: Secondary | ICD-10-CM | POA: Insufficient documentation

## 2019-09-01 LAB — CBC WITH DIFFERENTIAL/PLATELET
Abs Immature Granulocytes: 0.02 10*3/uL (ref 0.00–0.07)
Basophils Absolute: 0 10*3/uL (ref 0.0–0.1)
Basophils Relative: 0 %
Eosinophils Absolute: 0.2 10*3/uL (ref 0.0–0.5)
Eosinophils Relative: 2 %
HCT: 35.5 % — ABNORMAL LOW (ref 36.0–46.0)
Hemoglobin: 10.8 g/dL — ABNORMAL LOW (ref 12.0–15.0)
Immature Granulocytes: 0 %
Lymphocytes Relative: 19 %
Lymphs Abs: 1.3 10*3/uL (ref 0.7–4.0)
MCH: 29.3 pg (ref 26.0–34.0)
MCHC: 30.4 g/dL (ref 30.0–36.0)
MCV: 96.5 fL (ref 80.0–100.0)
Monocytes Absolute: 0.4 10*3/uL (ref 0.1–1.0)
Monocytes Relative: 5 %
Neutro Abs: 5 10*3/uL (ref 1.7–7.7)
Neutrophils Relative %: 74 %
Platelets: 193 10*3/uL (ref 150–400)
RBC: 3.68 MIL/uL — ABNORMAL LOW (ref 3.87–5.11)
RDW: 14.1 % (ref 11.5–15.5)
WBC: 6.8 10*3/uL (ref 4.0–10.5)
nRBC: 0 % (ref 0.0–0.2)

## 2019-09-01 LAB — COMPREHENSIVE METABOLIC PANEL
ALT: 43 U/L (ref 0–44)
AST: 41 U/L (ref 15–41)
Albumin: 2.9 g/dL — ABNORMAL LOW (ref 3.5–5.0)
Alkaline Phosphatase: 89 U/L (ref 38–126)
Anion gap: 9 (ref 5–15)
BUN: 36 mg/dL — ABNORMAL HIGH (ref 8–23)
CO2: 25 mmol/L (ref 22–32)
Calcium: 8.8 mg/dL — ABNORMAL LOW (ref 8.9–10.3)
Chloride: 108 mmol/L (ref 98–111)
Creatinine, Ser: 3.52 mg/dL — ABNORMAL HIGH (ref 0.44–1.00)
GFR calc Af Amer: 13 mL/min — ABNORMAL LOW (ref >60)
GFR calc non Af Amer: 11 mL/min — ABNORMAL LOW (ref >60)
Glucose, Bld: 167 mg/dL — ABNORMAL HIGH (ref 70–99)
Potassium: 4.5 mmol/L (ref 3.5–5.1)
Sodium: 142 mmol/L (ref 135–145)
Total Bilirubin: 0.8 mg/dL (ref 0.3–1.2)
Total Protein: 5.4 g/dL — ABNORMAL LOW (ref 6.5–8.1)

## 2019-09-01 LAB — POCT HEMOGLOBIN-HEMACUE: Hemoglobin: 12.3 g/dL (ref 12.0–15.0)

## 2019-09-01 LAB — PHOSPHORUS: Phosphorus: 4.1 mg/dL (ref 2.5–4.6)

## 2019-09-01 MED ORDER — EPOETIN ALFA-EPBX 10000 UNIT/ML IJ SOLN: 10000.0000 [IU] | INTRAMUSCULAR | Status: DC

## 2019-09-02 LAB — PTH, INTACT AND CALCIUM
Calcium, Total (PTH): 8.6 mg/dL — ABNORMAL LOW (ref 8.7–10.3)
PTH: 192 pg/mL — ABNORMAL HIGH (ref 15–65)

## 2019-09-03 DIAGNOSIS — R197 Diarrhea, unspecified: Secondary | ICD-10-CM | POA: Diagnosis not present

## 2019-09-03 DIAGNOSIS — D649 Anemia, unspecified: Secondary | ICD-10-CM | POA: Diagnosis not present

## 2019-09-03 DIAGNOSIS — N183 Chronic kidney disease, stage 3 unspecified: Secondary | ICD-10-CM | POA: Diagnosis not present

## 2019-09-03 DIAGNOSIS — I10 Essential (primary) hypertension: Secondary | ICD-10-CM | POA: Diagnosis not present

## 2019-09-03 DIAGNOSIS — E1165 Type 2 diabetes mellitus with hyperglycemia: Secondary | ICD-10-CM | POA: Diagnosis not present

## 2019-09-03 DIAGNOSIS — D631 Anemia in chronic kidney disease: Secondary | ICD-10-CM | POA: Diagnosis not present

## 2019-09-03 DIAGNOSIS — E559 Vitamin D deficiency, unspecified: Secondary | ICD-10-CM | POA: Diagnosis not present

## 2019-09-03 DIAGNOSIS — N186 End stage renal disease: Secondary | ICD-10-CM | POA: Diagnosis not present

## 2019-09-03 DIAGNOSIS — N189 Chronic kidney disease, unspecified: Secondary | ICD-10-CM | POA: Diagnosis not present

## 2019-09-07 DIAGNOSIS — J209 Acute bronchitis, unspecified: Secondary | ICD-10-CM | POA: Diagnosis not present

## 2019-09-17 ENCOUNTER — Other Ambulatory Visit: Payer: Self-pay

## 2019-09-17 ENCOUNTER — Encounter (HOSPITAL_COMMUNITY)
Admission: RE | Admit: 2019-09-17 | Discharge: 2019-09-17 | Disposition: A | Discharge status: Home / Self Care | Payer: Medicare PPO | Source: Ambulatory Visit | Attending: Nephrology | Admitting: Nephrology

## 2019-09-17 VITALS — BP 135/66 | HR 56 | Temp 98.2°F | Resp 20

## 2019-09-17 DIAGNOSIS — N179 Acute kidney failure, unspecified: Secondary | ICD-10-CM

## 2019-09-17 LAB — POCT HEMOGLOBIN-HEMACUE: Hemoglobin: 11.2 g/dL — ABNORMAL LOW (ref 12.0–15.0)

## 2019-09-17 MED ORDER — EPOETIN ALFA-EPBX 10000 UNIT/ML IJ SOLN
INTRAMUSCULAR | Status: AC
  Filled 2019-09-17: qty 1

## 2019-09-17 MED ORDER — EPOETIN ALFA-EPBX 10000 UNIT/ML IJ SOLN
10000.0000 [IU] | INTRAMUSCULAR | Status: DC
  Administered 2019-09-17: 10000 [IU] via SUBCUTANEOUS

## 2019-09-23 DIAGNOSIS — Z23 Encounter for immunization: Secondary | ICD-10-CM | POA: Diagnosis not present

## 2019-09-24 ENCOUNTER — Encounter (HOSPITAL_COMMUNITY): Payer: Medicare PPO

## 2019-09-28 DIAGNOSIS — H04123 Dry eye syndrome of bilateral lacrimal glands: Secondary | ICD-10-CM | POA: Diagnosis not present

## 2019-09-28 DIAGNOSIS — H524 Presbyopia: Secondary | ICD-10-CM | POA: Diagnosis not present

## 2019-09-28 DIAGNOSIS — E119 Type 2 diabetes mellitus without complications: Secondary | ICD-10-CM | POA: Diagnosis not present

## 2019-09-28 DIAGNOSIS — E559 Vitamin D deficiency, unspecified: Secondary | ICD-10-CM | POA: Diagnosis not present

## 2019-09-28 DIAGNOSIS — Z961 Presence of intraocular lens: Secondary | ICD-10-CM | POA: Diagnosis not present

## 2019-09-29 ENCOUNTER — Encounter (HOSPITAL_COMMUNITY): Payer: Medicare PPO

## 2019-09-29 DIAGNOSIS — R197 Diarrhea, unspecified: Secondary | ICD-10-CM | POA: Diagnosis not present

## 2019-10-01 DIAGNOSIS — R197 Diarrhea, unspecified: Secondary | ICD-10-CM | POA: Diagnosis not present

## 2019-10-05 DIAGNOSIS — D631 Anemia in chronic kidney disease: Secondary | ICD-10-CM | POA: Diagnosis not present

## 2019-10-05 DIAGNOSIS — N189 Chronic kidney disease, unspecified: Secondary | ICD-10-CM | POA: Diagnosis not present

## 2019-10-05 DIAGNOSIS — N179 Acute kidney failure, unspecified: Secondary | ICD-10-CM | POA: Diagnosis not present

## 2019-10-05 DIAGNOSIS — N200 Calculus of kidney: Secondary | ICD-10-CM | POA: Diagnosis not present

## 2019-10-05 DIAGNOSIS — E891 Postprocedural hypoinsulinemia: Secondary | ICD-10-CM | POA: Diagnosis not present

## 2019-10-05 DIAGNOSIS — N2581 Secondary hyperparathyroidism of renal origin: Secondary | ICD-10-CM | POA: Diagnosis not present

## 2019-10-05 DIAGNOSIS — E139 Other specified diabetes mellitus without complications: Secondary | ICD-10-CM | POA: Diagnosis not present

## 2019-10-05 DIAGNOSIS — E785 Hyperlipidemia, unspecified: Secondary | ICD-10-CM | POA: Diagnosis not present

## 2019-10-05 DIAGNOSIS — I120 Hypertensive chronic kidney disease with stage 5 chronic kidney disease or end stage renal disease: Secondary | ICD-10-CM | POA: Diagnosis not present

## 2019-10-05 DIAGNOSIS — N185 Chronic kidney disease, stage 5: Secondary | ICD-10-CM | POA: Diagnosis not present

## 2019-10-06 DIAGNOSIS — R5383 Other fatigue: Secondary | ICD-10-CM | POA: Diagnosis not present

## 2019-10-06 DIAGNOSIS — N183 Chronic kidney disease, stage 3 unspecified: Secondary | ICD-10-CM | POA: Diagnosis not present

## 2019-10-06 DIAGNOSIS — D649 Anemia, unspecified: Secondary | ICD-10-CM | POA: Diagnosis not present

## 2019-10-06 DIAGNOSIS — N189 Chronic kidney disease, unspecified: Secondary | ICD-10-CM | POA: Diagnosis not present

## 2019-10-06 DIAGNOSIS — N186 End stage renal disease: Secondary | ICD-10-CM | POA: Diagnosis not present

## 2019-10-06 DIAGNOSIS — I10 Essential (primary) hypertension: Secondary | ICD-10-CM | POA: Diagnosis not present

## 2019-10-06 DIAGNOSIS — D631 Anemia in chronic kidney disease: Secondary | ICD-10-CM | POA: Diagnosis not present

## 2019-10-06 DIAGNOSIS — E875 Hyperkalemia: Secondary | ICD-10-CM | POA: Diagnosis not present

## 2019-10-06 DIAGNOSIS — R197 Diarrhea, unspecified: Secondary | ICD-10-CM | POA: Diagnosis not present

## 2019-10-12 DIAGNOSIS — Z23 Encounter for immunization: Secondary | ICD-10-CM | POA: Diagnosis not present

## 2019-10-15 ENCOUNTER — Other Ambulatory Visit: Payer: Self-pay

## 2019-10-15 ENCOUNTER — Ambulatory Visit (HOSPITAL_COMMUNITY)
Admission: RE | Admit: 2019-10-15 | Discharge: 2019-10-15 | Disposition: A | Discharge status: Home / Self Care | Payer: Medicare PPO | Source: Ambulatory Visit | Attending: Nephrology | Admitting: Nephrology

## 2019-10-15 DIAGNOSIS — D631 Anemia in chronic kidney disease: Secondary | ICD-10-CM | POA: Diagnosis not present

## 2019-10-15 DIAGNOSIS — N185 Chronic kidney disease, stage 5: Secondary | ICD-10-CM | POA: Insufficient documentation

## 2019-10-15 DIAGNOSIS — N179 Acute kidney failure, unspecified: Secondary | ICD-10-CM

## 2019-10-15 LAB — CBC WITH DIFFERENTIAL/PLATELET
Abs Immature Granulocytes: 0.03 10*3/uL (ref 0.00–0.07)
Basophils Absolute: 0.1 10*3/uL (ref 0.0–0.1)
Basophils Relative: 1 %
Eosinophils Absolute: 0.3 10*3/uL (ref 0.0–0.5)
Eosinophils Relative: 4 %
HCT: 35.2 % — ABNORMAL LOW (ref 36.0–46.0)
Hemoglobin: 10.7 g/dL — ABNORMAL LOW (ref 12.0–15.0)
Immature Granulocytes: 0 %
Lymphocytes Relative: 22 %
Lymphs Abs: 1.7 10*3/uL (ref 0.7–4.0)
MCH: 29 pg (ref 26.0–34.0)
MCHC: 30.4 g/dL (ref 30.0–36.0)
MCV: 95.4 fL (ref 80.0–100.0)
Monocytes Absolute: 0.5 10*3/uL (ref 0.1–1.0)
Monocytes Relative: 7 %
Neutro Abs: 5.2 10*3/uL (ref 1.7–7.7)
Neutrophils Relative %: 66 %
Platelets: 215 10*3/uL (ref 150–400)
RBC: 3.69 MIL/uL — ABNORMAL LOW (ref 3.87–5.11)
RDW: 14.7 % (ref 11.5–15.5)
WBC: 7.8 10*3/uL (ref 4.0–10.5)
nRBC: 0 % (ref 0.0–0.2)

## 2019-10-15 LAB — IRON AND TIBC
Iron: 64 ug/dL (ref 28–170)
Saturation Ratios: 18 % (ref 10.4–31.8)
TIBC: 350 ug/dL (ref 250–450)
UIBC: 286 ug/dL

## 2019-10-15 LAB — COMPREHENSIVE METABOLIC PANEL
ALT: 75 U/L — ABNORMAL HIGH (ref 0–44)
AST: 50 U/L — ABNORMAL HIGH (ref 15–41)
Albumin: 3.2 g/dL — ABNORMAL LOW (ref 3.5–5.0)
Alkaline Phosphatase: 88 U/L (ref 38–126)
Anion gap: 11 (ref 5–15)
BUN: 47 mg/dL — ABNORMAL HIGH (ref 8–23)
CO2: 26 mmol/L (ref 22–32)
Calcium: 9.4 mg/dL (ref 8.9–10.3)
Chloride: 106 mmol/L (ref 98–111)
Creatinine, Ser: 3.78 mg/dL — ABNORMAL HIGH (ref 0.44–1.00)
GFR calc Af Amer: 12 mL/min — ABNORMAL LOW (ref >60)
GFR calc non Af Amer: 11 mL/min — ABNORMAL LOW (ref >60)
Glucose, Bld: 114 mg/dL — ABNORMAL HIGH (ref 70–99)
Potassium: 4.9 mmol/L (ref 3.5–5.1)
Sodium: 143 mmol/L (ref 135–145)
Total Bilirubin: 0.7 mg/dL (ref 0.3–1.2)
Total Protein: 6.4 g/dL — ABNORMAL LOW (ref 6.5–8.1)

## 2019-10-15 LAB — POCT HEMOGLOBIN-HEMACUE: Hemoglobin: 10.9 g/dL — ABNORMAL LOW (ref 12.0–15.0)

## 2019-10-15 LAB — FERRITIN: Ferritin: 182 ng/mL (ref 11–307)

## 2019-10-15 LAB — PHOSPHORUS: Phosphorus: 4.6 mg/dL (ref 2.5–4.6)

## 2019-10-15 MED ORDER — EPOETIN ALFA-EPBX 10000 UNIT/ML IJ SOLN
INTRAMUSCULAR | Status: AC
  Administered 2019-10-15: 10000 [IU] via SUBCUTANEOUS
  Filled 2019-10-15: qty 1

## 2019-10-15 MED ORDER — EPOETIN ALFA-EPBX 10000 UNIT/ML IJ SOLN: 10000.0000 [IU] | INTRAMUSCULAR | Status: DC

## 2019-10-27 DIAGNOSIS — T50905A Adverse effect of unspecified drugs, medicaments and biological substances, initial encounter: Secondary | ICD-10-CM | POA: Diagnosis not present

## 2019-10-27 DIAGNOSIS — N183 Chronic kidney disease, stage 3 unspecified: Secondary | ICD-10-CM | POA: Diagnosis not present

## 2019-10-27 DIAGNOSIS — E109 Type 1 diabetes mellitus without complications: Secondary | ICD-10-CM | POA: Diagnosis not present

## 2019-11-03 DIAGNOSIS — R609 Edema, unspecified: Secondary | ICD-10-CM | POA: Diagnosis not present

## 2019-11-03 DIAGNOSIS — M109 Gout, unspecified: Secondary | ICD-10-CM | POA: Diagnosis not present

## 2019-11-03 DIAGNOSIS — E1065 Type 1 diabetes mellitus with hyperglycemia: Secondary | ICD-10-CM | POA: Diagnosis not present

## 2019-11-03 DIAGNOSIS — K219 Gastro-esophageal reflux disease without esophagitis: Secondary | ICD-10-CM | POA: Diagnosis not present

## 2019-11-03 DIAGNOSIS — F419 Anxiety disorder, unspecified: Secondary | ICD-10-CM | POA: Diagnosis not present

## 2019-11-03 DIAGNOSIS — I120 Hypertensive chronic kidney disease with stage 5 chronic kidney disease or end stage renal disease: Secondary | ICD-10-CM | POA: Diagnosis not present

## 2019-11-03 DIAGNOSIS — N185 Chronic kidney disease, stage 5: Secondary | ICD-10-CM | POA: Diagnosis not present

## 2019-11-03 DIAGNOSIS — Z809 Family history of malignant neoplasm, unspecified: Secondary | ICD-10-CM | POA: Diagnosis not present

## 2019-11-03 DIAGNOSIS — Z992 Dependence on renal dialysis: Secondary | ICD-10-CM | POA: Diagnosis not present

## 2019-11-03 DIAGNOSIS — E1022 Type 1 diabetes mellitus with diabetic chronic kidney disease: Secondary | ICD-10-CM | POA: Diagnosis not present

## 2019-11-03 DIAGNOSIS — Z818 Family history of other mental and behavioral disorders: Secondary | ICD-10-CM | POA: Diagnosis not present

## 2019-11-03 DIAGNOSIS — Z823 Family history of stroke: Secondary | ICD-10-CM | POA: Diagnosis not present

## 2019-11-03 DIAGNOSIS — Z8673 Personal history of transient ischemic attack (TIA), and cerebral infarction without residual deficits: Secondary | ICD-10-CM | POA: Diagnosis not present

## 2019-11-03 DIAGNOSIS — E8809 Other disorders of plasma-protein metabolism, not elsewhere classified: Secondary | ICD-10-CM | POA: Diagnosis not present

## 2019-11-12 ENCOUNTER — Other Ambulatory Visit: Payer: Self-pay

## 2019-11-12 ENCOUNTER — Ambulatory Visit (HOSPITAL_COMMUNITY)
Admission: RE | Admit: 2019-11-12 | Discharge: 2019-11-12 | Disposition: A | Discharge status: Home / Self Care | Payer: Medicare PPO | Source: Ambulatory Visit | Attending: Nephrology | Admitting: Nephrology

## 2019-11-12 VITALS — HR 62 | Resp 16

## 2019-11-12 DIAGNOSIS — N179 Acute kidney failure, unspecified: Secondary | ICD-10-CM | POA: Diagnosis not present

## 2019-11-12 LAB — CBC WITH DIFFERENTIAL/PLATELET
Abs Immature Granulocytes: 0.02 10*3/uL (ref 0.00–0.07)
Basophils Absolute: 0.1 10*3/uL (ref 0.0–0.1)
Basophils Relative: 1 %
Eosinophils Absolute: 0.3 10*3/uL (ref 0.0–0.5)
Eosinophils Relative: 3 %
HCT: 36.7 % (ref 36.0–46.0)
Hemoglobin: 11.4 g/dL — ABNORMAL LOW (ref 12.0–15.0)
Immature Granulocytes: 0 %
Lymphocytes Relative: 20 %
Lymphs Abs: 1.7 10*3/uL (ref 0.7–4.0)
MCH: 28.7 pg (ref 26.0–34.0)
MCHC: 31.1 g/dL (ref 30.0–36.0)
MCV: 92.4 fL (ref 80.0–100.0)
Monocytes Absolute: 0.4 10*3/uL (ref 0.1–1.0)
Monocytes Relative: 4 %
Neutro Abs: 5.8 10*3/uL (ref 1.7–7.7)
Neutrophils Relative %: 72 %
Platelets: 239 10*3/uL (ref 150–400)
RBC: 3.97 MIL/uL (ref 3.87–5.11)
RDW: 14.2 % (ref 11.5–15.5)
WBC: 8.2 10*3/uL (ref 4.0–10.5)
nRBC: 0 % (ref 0.0–0.2)

## 2019-11-12 LAB — COMPREHENSIVE METABOLIC PANEL
ALT: 41 U/L (ref 0–44)
AST: 36 U/L (ref 15–41)
Albumin: 3.1 g/dL — ABNORMAL LOW (ref 3.5–5.0)
Alkaline Phosphatase: 112 U/L (ref 38–126)
Anion gap: 10 (ref 5–15)
BUN: 43 mg/dL — ABNORMAL HIGH (ref 8–23)
CO2: 24 mmol/L (ref 22–32)
Calcium: 8.9 mg/dL (ref 8.9–10.3)
Chloride: 108 mmol/L (ref 98–111)
Creatinine, Ser: 3.58 mg/dL — ABNORMAL HIGH (ref 0.44–1.00)
GFR calc Af Amer: 13 mL/min — ABNORMAL LOW (ref >60)
GFR calc non Af Amer: 11 mL/min — ABNORMAL LOW (ref >60)
Glucose, Bld: 133 mg/dL — ABNORMAL HIGH (ref 70–99)
Potassium: 3.8 mmol/L (ref 3.5–5.1)
Sodium: 142 mmol/L (ref 135–145)
Total Bilirubin: 0.8 mg/dL (ref 0.3–1.2)
Total Protein: 6 g/dL — ABNORMAL LOW (ref 6.5–8.1)

## 2019-11-12 LAB — POCT HEMOGLOBIN-HEMACUE: Hemoglobin: 11.2 g/dL — ABNORMAL LOW (ref 12.0–15.0)

## 2019-11-12 LAB — PHOSPHORUS: Phosphorus: 4.9 mg/dL — ABNORMAL HIGH (ref 2.5–4.6)

## 2019-11-12 MED ORDER — EPOETIN ALFA-EPBX 10000 UNIT/ML IJ SOLN
INTRAMUSCULAR | Status: AC
  Filled 2019-11-12: qty 1

## 2019-11-12 MED ORDER — EPOETIN ALFA-EPBX 10000 UNIT/ML IJ SOLN
10000.0000 [IU] | INTRAMUSCULAR | Status: DC
  Administered 2019-11-12: 10000 [IU] via SUBCUTANEOUS

## 2019-11-17 DIAGNOSIS — N2581 Secondary hyperparathyroidism of renal origin: Secondary | ICD-10-CM | POA: Diagnosis not present

## 2019-11-17 DIAGNOSIS — N179 Acute kidney failure, unspecified: Secondary | ICD-10-CM | POA: Diagnosis not present

## 2019-11-17 DIAGNOSIS — D631 Anemia in chronic kidney disease: Secondary | ICD-10-CM | POA: Diagnosis not present

## 2019-11-17 DIAGNOSIS — M109 Gout, unspecified: Secondary | ICD-10-CM | POA: Diagnosis not present

## 2019-11-17 DIAGNOSIS — E139 Other specified diabetes mellitus without complications: Secondary | ICD-10-CM | POA: Diagnosis not present

## 2019-11-17 DIAGNOSIS — N200 Calculus of kidney: Secondary | ICD-10-CM | POA: Diagnosis not present

## 2019-11-17 DIAGNOSIS — E785 Hyperlipidemia, unspecified: Secondary | ICD-10-CM | POA: Diagnosis not present

## 2019-11-17 DIAGNOSIS — N185 Chronic kidney disease, stage 5: Secondary | ICD-10-CM | POA: Diagnosis not present

## 2019-11-17 DIAGNOSIS — I120 Hypertensive chronic kidney disease with stage 5 chronic kidney disease or end stage renal disease: Secondary | ICD-10-CM | POA: Diagnosis not present

## 2019-12-01 DIAGNOSIS — M25562 Pain in left knee: Secondary | ICD-10-CM | POA: Diagnosis not present

## 2019-12-01 DIAGNOSIS — M25462 Effusion, left knee: Secondary | ICD-10-CM | POA: Diagnosis not present

## 2019-12-01 DIAGNOSIS — M1712 Unilateral primary osteoarthritis, left knee: Secondary | ICD-10-CM | POA: Diagnosis not present

## 2019-12-08 DIAGNOSIS — M25562 Pain in left knee: Secondary | ICD-10-CM | POA: Diagnosis not present

## 2019-12-10 ENCOUNTER — Other Ambulatory Visit: Payer: Self-pay

## 2019-12-10 ENCOUNTER — Ambulatory Visit (HOSPITAL_COMMUNITY)
Admission: RE | Admit: 2019-12-10 | Discharge: 2019-12-10 | Disposition: A | Discharge status: Home / Self Care | Payer: Medicare PPO | Source: Ambulatory Visit | Attending: Nephrology | Admitting: Nephrology

## 2019-12-10 VITALS — BP 137/80 | HR 78 | Temp 98.3°F | Resp 20

## 2019-12-10 DIAGNOSIS — D631 Anemia in chronic kidney disease: Secondary | ICD-10-CM | POA: Insufficient documentation

## 2019-12-10 DIAGNOSIS — N185 Chronic kidney disease, stage 5: Secondary | ICD-10-CM | POA: Diagnosis not present

## 2019-12-10 DIAGNOSIS — N179 Acute kidney failure, unspecified: Secondary | ICD-10-CM

## 2019-12-10 LAB — FERRITIN: Ferritin: 295 ng/mL (ref 11–307)

## 2019-12-10 LAB — CBC WITH DIFFERENTIAL/PLATELET
Abs Immature Granulocytes: 0.04 10*3/uL (ref 0.00–0.07)
Basophils Absolute: 0 10*3/uL (ref 0.0–0.1)
Basophils Relative: 0 %
Eosinophils Absolute: 0 10*3/uL (ref 0.0–0.5)
Eosinophils Relative: 0 %
HCT: 34.6 % — ABNORMAL LOW (ref 36.0–46.0)
Hemoglobin: 11 g/dL — ABNORMAL LOW (ref 12.0–15.0)
Immature Granulocytes: 0 %
Lymphocytes Relative: 6 %
Lymphs Abs: 0.6 10*3/uL — ABNORMAL LOW (ref 0.7–4.0)
MCH: 28.5 pg (ref 26.0–34.0)
MCHC: 31.8 g/dL (ref 30.0–36.0)
MCV: 89.6 fL (ref 80.0–100.0)
Monocytes Absolute: 0.4 10*3/uL (ref 0.1–1.0)
Monocytes Relative: 3 %
Neutro Abs: 9.6 10*3/uL — ABNORMAL HIGH (ref 1.7–7.7)
Neutrophils Relative %: 91 %
Platelets: 428 10*3/uL — ABNORMAL HIGH (ref 150–400)
RBC: 3.86 MIL/uL — ABNORMAL LOW (ref 3.87–5.11)
RDW: 13.5 % (ref 11.5–15.5)
WBC: 10.6 10*3/uL — ABNORMAL HIGH (ref 4.0–10.5)
nRBC: 0 % (ref 0.0–0.2)

## 2019-12-10 LAB — IRON AND TIBC
Iron: 71 ug/dL (ref 28–170)
Saturation Ratios: 25 % (ref 10.4–31.8)
TIBC: 280 ug/dL (ref 250–450)
UIBC: 209 ug/dL

## 2019-12-10 LAB — COMPREHENSIVE METABOLIC PANEL
ALT: 22 U/L (ref 0–44)
AST: 16 U/L (ref 15–41)
Albumin: 3 g/dL — ABNORMAL LOW (ref 3.5–5.0)
Alkaline Phosphatase: 73 U/L (ref 38–126)
Anion gap: 11 (ref 5–15)
BUN: 79 mg/dL — ABNORMAL HIGH (ref 8–23)
CO2: 27 mmol/L (ref 22–32)
Calcium: 9.4 mg/dL (ref 8.9–10.3)
Chloride: 100 mmol/L (ref 98–111)
Creatinine, Ser: 3.63 mg/dL — ABNORMAL HIGH (ref 0.44–1.00)
GFR, Estimated: 12 mL/min — ABNORMAL LOW (ref >60)
Glucose, Bld: 404 mg/dL — ABNORMAL HIGH (ref 70–99)
Potassium: 4 mmol/L (ref 3.5–5.1)
Sodium: 138 mmol/L (ref 135–145)
Total Bilirubin: 0.4 mg/dL (ref 0.3–1.2)
Total Protein: 6.5 g/dL (ref 6.5–8.1)

## 2019-12-10 LAB — POCT HEMOGLOBIN-HEMACUE: Hemoglobin: 11.3 g/dL — ABNORMAL LOW (ref 12.0–15.0)

## 2019-12-10 LAB — PHOSPHORUS: Phosphorus: 3.5 mg/dL (ref 2.5–4.6)

## 2019-12-10 MED ORDER — EPOETIN ALFA-EPBX 10000 UNIT/ML IJ SOLN
INTRAMUSCULAR | Status: AC
  Filled 2019-12-10: qty 1

## 2019-12-10 MED ORDER — EPOETIN ALFA-EPBX 10000 UNIT/ML IJ SOLN
10000.0000 [IU] | INTRAMUSCULAR | Status: DC
  Administered 2019-12-10: 10000 [IU] via SUBCUTANEOUS

## 2019-12-11 LAB — PTH, INTACT AND CALCIUM
Calcium, Total (PTH): 9.2 mg/dL (ref 8.7–10.3)
PTH: 151 pg/mL — ABNORMAL HIGH (ref 15–65)

## 2019-12-13 DIAGNOSIS — M25569 Pain in unspecified knee: Secondary | ICD-10-CM | POA: Diagnosis not present

## 2019-12-13 DIAGNOSIS — S40812A Abrasion of left upper arm, initial encounter: Secondary | ICD-10-CM | POA: Diagnosis not present

## 2019-12-13 DIAGNOSIS — K8689 Other specified diseases of pancreas: Secondary | ICD-10-CM | POA: Diagnosis not present

## 2019-12-13 DIAGNOSIS — Z90410 Acquired total absence of pancreas: Secondary | ICD-10-CM | POA: Diagnosis not present

## 2019-12-13 DIAGNOSIS — M109 Gout, unspecified: Secondary | ICD-10-CM | POA: Diagnosis not present

## 2019-12-17 DIAGNOSIS — R197 Diarrhea, unspecified: Secondary | ICD-10-CM | POA: Diagnosis not present

## 2019-12-29 DIAGNOSIS — M25562 Pain in left knee: Secondary | ICD-10-CM | POA: Diagnosis not present

## 2020-01-05 DIAGNOSIS — M25562 Pain in left knee: Secondary | ICD-10-CM | POA: Diagnosis not present

## 2020-01-06 DIAGNOSIS — N2581 Secondary hyperparathyroidism of renal origin: Secondary | ICD-10-CM | POA: Diagnosis not present

## 2020-01-06 DIAGNOSIS — D631 Anemia in chronic kidney disease: Secondary | ICD-10-CM | POA: Diagnosis not present

## 2020-01-06 DIAGNOSIS — I120 Hypertensive chronic kidney disease with stage 5 chronic kidney disease or end stage renal disease: Secondary | ICD-10-CM | POA: Diagnosis not present

## 2020-01-06 DIAGNOSIS — E785 Hyperlipidemia, unspecified: Secondary | ICD-10-CM | POA: Diagnosis not present

## 2020-01-06 DIAGNOSIS — N179 Acute kidney failure, unspecified: Secondary | ICD-10-CM | POA: Diagnosis not present

## 2020-01-06 DIAGNOSIS — E891 Postprocedural hypoinsulinemia: Secondary | ICD-10-CM | POA: Diagnosis not present

## 2020-01-06 DIAGNOSIS — N200 Calculus of kidney: Secondary | ICD-10-CM | POA: Diagnosis not present

## 2020-01-06 DIAGNOSIS — M109 Gout, unspecified: Secondary | ICD-10-CM | POA: Diagnosis not present

## 2020-01-06 DIAGNOSIS — N185 Chronic kidney disease, stage 5: Secondary | ICD-10-CM | POA: Diagnosis not present

## 2020-01-07 ENCOUNTER — Other Ambulatory Visit: Payer: Self-pay

## 2020-01-07 ENCOUNTER — Ambulatory Visit (HOSPITAL_COMMUNITY)
Admission: RE | Admit: 2020-01-07 | Discharge: 2020-01-07 | Disposition: A | Discharge status: Home / Self Care | Payer: Medicare PPO | Source: Ambulatory Visit | Attending: Nephrology | Admitting: Nephrology

## 2020-01-07 DIAGNOSIS — N179 Acute kidney failure, unspecified: Secondary | ICD-10-CM | POA: Insufficient documentation

## 2020-01-07 LAB — PHOSPHORUS: Phosphorus: 4.5 mg/dL (ref 2.5–4.6)

## 2020-01-07 LAB — CBC WITH DIFFERENTIAL/PLATELET
Abs Immature Granulocytes: 0.05 10*3/uL (ref 0.00–0.07)
Basophils Absolute: 0 10*3/uL (ref 0.0–0.1)
Basophils Relative: 0 %
Eosinophils Absolute: 0.1 10*3/uL (ref 0.0–0.5)
Eosinophils Relative: 1 %
HCT: 35 % — ABNORMAL LOW (ref 36.0–46.0)
Hemoglobin: 10.7 g/dL — ABNORMAL LOW (ref 12.0–15.0)
Immature Granulocytes: 1 %
Lymphocytes Relative: 18 %
Lymphs Abs: 1.4 10*3/uL (ref 0.7–4.0)
MCH: 28.5 pg (ref 26.0–34.0)
MCHC: 30.6 g/dL (ref 30.0–36.0)
MCV: 93.1 fL (ref 80.0–100.0)
Monocytes Absolute: 0.5 10*3/uL (ref 0.1–1.0)
Monocytes Relative: 7 %
Neutro Abs: 5.7 10*3/uL (ref 1.7–7.7)
Neutrophils Relative %: 73 %
Platelets: 240 10*3/uL (ref 150–400)
RBC: 3.76 MIL/uL — ABNORMAL LOW (ref 3.87–5.11)
RDW: 14.8 % (ref 11.5–15.5)
WBC: 7.7 10*3/uL (ref 4.0–10.5)
nRBC: 0 % (ref 0.0–0.2)

## 2020-01-07 LAB — COMPREHENSIVE METABOLIC PANEL
ALT: 43 U/L (ref 0–44)
AST: 38 U/L (ref 15–41)
Albumin: 3 g/dL — ABNORMAL LOW (ref 3.5–5.0)
Alkaline Phosphatase: 118 U/L (ref 38–126)
Anion gap: 11 (ref 5–15)
BUN: 50 mg/dL — ABNORMAL HIGH (ref 8–23)
CO2: 25 mmol/L (ref 22–32)
Calcium: 8.1 mg/dL — ABNORMAL LOW (ref 8.9–10.3)
Chloride: 104 mmol/L (ref 98–111)
Creatinine, Ser: 3.5 mg/dL — ABNORMAL HIGH (ref 0.44–1.00)
GFR, Estimated: 13 mL/min — ABNORMAL LOW (ref >60)
Glucose, Bld: 180 mg/dL — ABNORMAL HIGH (ref 70–99)
Potassium: 4.2 mmol/L (ref 3.5–5.1)
Sodium: 140 mmol/L (ref 135–145)
Total Bilirubin: 0.6 mg/dL (ref 0.3–1.2)
Total Protein: 5.6 g/dL — ABNORMAL LOW (ref 6.5–8.1)

## 2020-01-07 LAB — POCT HEMOGLOBIN-HEMACUE: Hemoglobin: 11.3 g/dL — ABNORMAL LOW (ref 12.0–15.0)

## 2020-01-07 MED ORDER — EPOETIN ALFA-EPBX 10000 UNIT/ML IJ SOLN
INTRAMUSCULAR | Status: AC
  Administered 2020-01-07: 10000 [IU]
  Filled 2020-01-07: qty 1

## 2020-01-07 MED ORDER — EPOETIN ALFA-EPBX 10000 UNIT/ML IJ SOLN: 10000.0000 [IU] | INTRAMUSCULAR | Status: DC

## 2020-02-04 ENCOUNTER — Ambulatory Visit (HOSPITAL_COMMUNITY)
Admission: RE | Admit: 2020-02-04 | Discharge: 2020-02-04 | Disposition: A | Discharge status: Home / Self Care | Payer: Medicare PPO | Source: Ambulatory Visit | Attending: Nephrology | Admitting: Nephrology

## 2020-02-04 ENCOUNTER — Other Ambulatory Visit: Payer: Self-pay

## 2020-02-04 VITALS — BP 122/48 | HR 65 | Resp 18

## 2020-02-04 DIAGNOSIS — N185 Chronic kidney disease, stage 5: Secondary | ICD-10-CM | POA: Insufficient documentation

## 2020-02-04 DIAGNOSIS — D631 Anemia in chronic kidney disease: Secondary | ICD-10-CM | POA: Insufficient documentation

## 2020-02-04 DIAGNOSIS — N179 Acute kidney failure, unspecified: Secondary | ICD-10-CM

## 2020-02-04 LAB — IRON AND TIBC
Iron: 28 ug/dL (ref 28–170)
Saturation Ratios: 10 % — ABNORMAL LOW (ref 10.4–31.8)
TIBC: 294 ug/dL (ref 250–450)
UIBC: 266 ug/dL

## 2020-02-04 LAB — COMPREHENSIVE METABOLIC PANEL
ALT: 27 U/L (ref 0–44)
AST: 20 U/L (ref 15–41)
Albumin: 3 g/dL — ABNORMAL LOW (ref 3.5–5.0)
Alkaline Phosphatase: 96 U/L (ref 38–126)
Anion gap: 13 (ref 5–15)
BUN: 58 mg/dL — ABNORMAL HIGH (ref 8–23)
CO2: 22 mmol/L (ref 22–32)
Calcium: 8.7 mg/dL — ABNORMAL LOW (ref 8.9–10.3)
Chloride: 103 mmol/L (ref 98–111)
Creatinine, Ser: 3.48 mg/dL — ABNORMAL HIGH (ref 0.44–1.00)
GFR, Estimated: 13 mL/min — ABNORMAL LOW (ref >60)
Glucose, Bld: 250 mg/dL — ABNORMAL HIGH (ref 70–99)
Potassium: 4.2 mmol/L (ref 3.5–5.1)
Sodium: 138 mmol/L (ref 135–145)
Total Bilirubin: 0.5 mg/dL (ref 0.3–1.2)
Total Protein: 5.9 g/dL — ABNORMAL LOW (ref 6.5–8.1)

## 2020-02-04 LAB — CBC WITH DIFFERENTIAL/PLATELET
Abs Immature Granulocytes: 0.05 10*3/uL (ref 0.00–0.07)
Basophils Absolute: 0 10*3/uL (ref 0.0–0.1)
Basophils Relative: 0 %
Eosinophils Absolute: 0.1 10*3/uL (ref 0.0–0.5)
Eosinophils Relative: 1 %
HCT: 34.2 % — ABNORMAL LOW (ref 36.0–46.0)
Hemoglobin: 10.6 g/dL — ABNORMAL LOW (ref 12.0–15.0)
Immature Granulocytes: 1 %
Lymphocytes Relative: 15 %
Lymphs Abs: 1.6 10*3/uL (ref 0.7–4.0)
MCH: 28.4 pg (ref 26.0–34.0)
MCHC: 31 g/dL (ref 30.0–36.0)
MCV: 91.7 fL (ref 80.0–100.0)
Monocytes Absolute: 0.5 10*3/uL (ref 0.1–1.0)
Monocytes Relative: 5 %
Neutro Abs: 8 10*3/uL — ABNORMAL HIGH (ref 1.7–7.7)
Neutrophils Relative %: 78 %
Platelets: 279 10*3/uL (ref 150–400)
RBC: 3.73 MIL/uL — ABNORMAL LOW (ref 3.87–5.11)
RDW: 15.1 % (ref 11.5–15.5)
WBC: 10.2 10*3/uL (ref 4.0–10.5)
nRBC: 0 % (ref 0.0–0.2)

## 2020-02-04 LAB — PHOSPHORUS: Phosphorus: 4.4 mg/dL (ref 2.5–4.6)

## 2020-02-04 LAB — FERRITIN: Ferritin: 179 ng/mL (ref 11–307)

## 2020-02-04 MED ORDER — EPOETIN ALFA-EPBX 10000 UNIT/ML IJ SOLN
INTRAMUSCULAR | Status: AC
  Filled 2020-02-04: qty 1

## 2020-02-04 MED ORDER — EPOETIN ALFA-EPBX 10000 UNIT/ML IJ SOLN
10000.0000 [IU] | INTRAMUSCULAR | Status: DC
  Administered 2020-02-04: 10000 [IU] via SUBCUTANEOUS

## 2020-02-07 DIAGNOSIS — M545 Low back pain, unspecified: Secondary | ICD-10-CM | POA: Diagnosis not present

## 2020-02-07 LAB — POCT HEMOGLOBIN-HEMACUE: Hemoglobin: 11 g/dL — ABNORMAL LOW (ref 12.0–15.0)

## 2020-02-22 DIAGNOSIS — E559 Vitamin D deficiency, unspecified: Secondary | ICD-10-CM | POA: Diagnosis not present

## 2020-02-24 DIAGNOSIS — M545 Low back pain, unspecified: Secondary | ICD-10-CM | POA: Diagnosis not present

## 2020-02-24 DIAGNOSIS — E559 Vitamin D deficiency, unspecified: Secondary | ICD-10-CM | POA: Diagnosis not present

## 2020-02-24 DIAGNOSIS — M25569 Pain in unspecified knee: Secondary | ICD-10-CM | POA: Diagnosis not present

## 2020-02-28 ENCOUNTER — Ambulatory Visit
Admission: RE | Admit: 2020-02-28 | Discharge: 2020-02-28 | Disposition: A | Discharge status: Home / Self Care | Payer: Medicare PPO | Source: Ambulatory Visit | Attending: Family Medicine | Admitting: Family Medicine

## 2020-02-28 ENCOUNTER — Other Ambulatory Visit: Payer: Self-pay

## 2020-02-28 ENCOUNTER — Other Ambulatory Visit: Payer: Self-pay | Admitting: Family Medicine

## 2020-02-28 DIAGNOSIS — M545 Low back pain, unspecified: Secondary | ICD-10-CM

## 2020-03-03 ENCOUNTER — Other Ambulatory Visit: Payer: Self-pay

## 2020-03-03 ENCOUNTER — Ambulatory Visit (HOSPITAL_COMMUNITY)
Admission: RE | Admit: 2020-03-03 | Discharge: 2020-03-03 | Disposition: A | Discharge status: Home / Self Care | Payer: Medicare PPO | Source: Ambulatory Visit | Attending: Nephrology | Admitting: Nephrology

## 2020-03-03 VITALS — BP 123/59 | HR 60 | Temp 97.6°F | Resp 18

## 2020-03-03 DIAGNOSIS — N179 Acute kidney failure, unspecified: Secondary | ICD-10-CM | POA: Diagnosis not present

## 2020-03-03 DIAGNOSIS — M5137 Other intervertebral disc degeneration, lumbosacral region: Secondary | ICD-10-CM | POA: Diagnosis not present

## 2020-03-03 LAB — CBC WITH DIFFERENTIAL/PLATELET
Abs Immature Granulocytes: 0.04 10*3/uL (ref 0.00–0.07)
Basophils Absolute: 0.1 10*3/uL (ref 0.0–0.1)
Basophils Relative: 1 %
Eosinophils Absolute: 0.3 10*3/uL (ref 0.0–0.5)
Eosinophils Relative: 4 %
HCT: 35 % — ABNORMAL LOW (ref 36.0–46.0)
Hemoglobin: 10.7 g/dL — ABNORMAL LOW (ref 12.0–15.0)
Immature Granulocytes: 1 %
Lymphocytes Relative: 21 %
Lymphs Abs: 1.8 10*3/uL (ref 0.7–4.0)
MCH: 28.2 pg (ref 26.0–34.0)
MCHC: 30.6 g/dL (ref 30.0–36.0)
MCV: 92.3 fL (ref 80.0–100.0)
Monocytes Absolute: 0.5 10*3/uL (ref 0.1–1.0)
Monocytes Relative: 5 %
Neutro Abs: 5.9 10*3/uL (ref 1.7–7.7)
Neutrophils Relative %: 68 %
Platelets: 264 10*3/uL (ref 150–400)
RBC: 3.79 MIL/uL — ABNORMAL LOW (ref 3.87–5.11)
RDW: 14.5 % (ref 11.5–15.5)
WBC: 8.5 10*3/uL (ref 4.0–10.5)
nRBC: 0 % (ref 0.0–0.2)

## 2020-03-03 LAB — COMPREHENSIVE METABOLIC PANEL
ALT: 33 U/L (ref 0–44)
AST: 28 U/L (ref 15–41)
Albumin: 3.2 g/dL — ABNORMAL LOW (ref 3.5–5.0)
Alkaline Phosphatase: 99 U/L (ref 38–126)
Anion gap: 11 (ref 5–15)
BUN: 56 mg/dL — ABNORMAL HIGH (ref 8–23)
CO2: 24 mmol/L (ref 22–32)
Calcium: 9.1 mg/dL (ref 8.9–10.3)
Chloride: 105 mmol/L (ref 98–111)
Creatinine, Ser: 3.43 mg/dL — ABNORMAL HIGH (ref 0.44–1.00)
GFR, Estimated: 13 mL/min — ABNORMAL LOW (ref >60)
Glucose, Bld: 148 mg/dL — ABNORMAL HIGH (ref 70–99)
Potassium: 4.4 mmol/L (ref 3.5–5.1)
Sodium: 140 mmol/L (ref 135–145)
Total Bilirubin: 0.7 mg/dL (ref 0.3–1.2)
Total Protein: 6 g/dL — ABNORMAL LOW (ref 6.5–8.1)

## 2020-03-03 LAB — POCT HEMOGLOBIN-HEMACUE: Hemoglobin: 10.9 g/dL — ABNORMAL LOW (ref 12.0–15.0)

## 2020-03-03 LAB — PHOSPHORUS: Phosphorus: 4.3 mg/dL (ref 2.5–4.6)

## 2020-03-03 MED ORDER — EPOETIN ALFA-EPBX 10000 UNIT/ML IJ SOLN
INTRAMUSCULAR | Status: AC
  Filled 2020-03-03: qty 1

## 2020-03-03 MED ORDER — EPOETIN ALFA-EPBX 10000 UNIT/ML IJ SOLN
10000.0000 [IU] | INTRAMUSCULAR | Status: DC
  Administered 2020-03-03: 10000 [IU] via SUBCUTANEOUS

## 2020-03-04 LAB — PTH, INTACT AND CALCIUM
Calcium, Total (PTH): 9 mg/dL (ref 8.7–10.3)
PTH: 126 pg/mL — ABNORMAL HIGH (ref 15–65)

## 2020-03-15 DIAGNOSIS — N179 Acute kidney failure, unspecified: Secondary | ICD-10-CM | POA: Diagnosis not present

## 2020-03-15 DIAGNOSIS — N200 Calculus of kidney: Secondary | ICD-10-CM | POA: Diagnosis not present

## 2020-03-15 DIAGNOSIS — N185 Chronic kidney disease, stage 5: Secondary | ICD-10-CM | POA: Diagnosis not present

## 2020-03-15 DIAGNOSIS — E139 Other specified diabetes mellitus without complications: Secondary | ICD-10-CM | POA: Diagnosis not present

## 2020-03-15 DIAGNOSIS — E785 Hyperlipidemia, unspecified: Secondary | ICD-10-CM | POA: Diagnosis not present

## 2020-03-15 DIAGNOSIS — N2581 Secondary hyperparathyroidism of renal origin: Secondary | ICD-10-CM | POA: Diagnosis not present

## 2020-03-15 DIAGNOSIS — D631 Anemia in chronic kidney disease: Secondary | ICD-10-CM | POA: Diagnosis not present

## 2020-03-15 DIAGNOSIS — I120 Hypertensive chronic kidney disease with stage 5 chronic kidney disease or end stage renal disease: Secondary | ICD-10-CM | POA: Diagnosis not present

## 2020-03-29 DIAGNOSIS — M5136 Other intervertebral disc degeneration, lumbar region: Secondary | ICD-10-CM | POA: Diagnosis not present

## 2020-03-31 ENCOUNTER — Ambulatory Visit (HOSPITAL_COMMUNITY)
Admission: RE | Admit: 2020-03-31 | Discharge: 2020-03-31 | Disposition: A | Discharge status: Home / Self Care | Payer: Medicare PPO | Source: Ambulatory Visit | Attending: Nephrology | Admitting: Nephrology

## 2020-03-31 ENCOUNTER — Other Ambulatory Visit: Payer: Self-pay

## 2020-03-31 VITALS — BP 126/60 | HR 57 | Temp 97.5°F | Resp 20

## 2020-03-31 DIAGNOSIS — N179 Acute kidney failure, unspecified: Secondary | ICD-10-CM

## 2020-03-31 DIAGNOSIS — N185 Chronic kidney disease, stage 5: Secondary | ICD-10-CM | POA: Diagnosis not present

## 2020-03-31 DIAGNOSIS — D631 Anemia in chronic kidney disease: Secondary | ICD-10-CM | POA: Insufficient documentation

## 2020-03-31 LAB — CBC WITH DIFFERENTIAL/PLATELET
Abs Immature Granulocytes: 0.03 10*3/uL (ref 0.00–0.07)
Basophils Absolute: 0.1 10*3/uL (ref 0.0–0.1)
Basophils Relative: 1 %
Eosinophils Absolute: 0.2 10*3/uL (ref 0.0–0.5)
Eosinophils Relative: 3 %
HCT: 33.3 % — ABNORMAL LOW (ref 36.0–46.0)
Hemoglobin: 10.9 g/dL — ABNORMAL LOW (ref 12.0–15.0)
Immature Granulocytes: 0 %
Lymphocytes Relative: 20 %
Lymphs Abs: 1.5 10*3/uL (ref 0.7–4.0)
MCH: 29.3 pg (ref 26.0–34.0)
MCHC: 32.7 g/dL (ref 30.0–36.0)
MCV: 89.5 fL (ref 80.0–100.0)
Monocytes Absolute: 0.3 10*3/uL (ref 0.1–1.0)
Monocytes Relative: 4 %
Neutro Abs: 5.4 10*3/uL (ref 1.7–7.7)
Neutrophils Relative %: 72 %
Platelets: 234 10*3/uL (ref 150–400)
RBC: 3.72 MIL/uL — ABNORMAL LOW (ref 3.87–5.11)
RDW: 14.1 % (ref 11.5–15.5)
WBC: 7.5 10*3/uL (ref 4.0–10.5)
nRBC: 0 % (ref 0.0–0.2)

## 2020-03-31 LAB — COMPREHENSIVE METABOLIC PANEL
ALT: 41 U/L (ref 0–44)
AST: 38 U/L (ref 15–41)
Albumin: 3.1 g/dL — ABNORMAL LOW (ref 3.5–5.0)
Alkaline Phosphatase: 82 U/L (ref 38–126)
Anion gap: 9 (ref 5–15)
BUN: 58 mg/dL — ABNORMAL HIGH (ref 8–23)
CO2: 23 mmol/L (ref 22–32)
Calcium: 8.8 mg/dL — ABNORMAL LOW (ref 8.9–10.3)
Chloride: 108 mmol/L (ref 98–111)
Creatinine, Ser: 3.47 mg/dL — ABNORMAL HIGH (ref 0.44–1.00)
GFR, Estimated: 13 mL/min — ABNORMAL LOW (ref >60)
Glucose, Bld: 247 mg/dL — ABNORMAL HIGH (ref 70–99)
Potassium: 4.2 mmol/L (ref 3.5–5.1)
Sodium: 140 mmol/L (ref 135–145)
Total Bilirubin: 0.7 mg/dL (ref 0.3–1.2)
Total Protein: 5.8 g/dL — ABNORMAL LOW (ref 6.5–8.1)

## 2020-03-31 LAB — PHOSPHORUS: Phosphorus: 4.4 mg/dL (ref 2.5–4.6)

## 2020-03-31 LAB — POCT HEMOGLOBIN-HEMACUE: Hemoglobin: 10.9 g/dL — ABNORMAL LOW (ref 12.0–15.0)

## 2020-03-31 LAB — FERRITIN: Ferritin: 135 ng/mL (ref 11–307)

## 2020-03-31 LAB — IRON AND TIBC
Iron: 67 ug/dL (ref 28–170)
Saturation Ratios: 21 % (ref 10.4–31.8)
TIBC: 319 ug/dL (ref 250–450)
UIBC: 252 ug/dL

## 2020-03-31 MED ORDER — EPOETIN ALFA-EPBX 10000 UNIT/ML IJ SOLN
INTRAMUSCULAR | Status: AC
  Filled 2020-03-31: qty 1

## 2020-03-31 MED ORDER — EPOETIN ALFA-EPBX 10000 UNIT/ML IJ SOLN
10000.0000 [IU] | INTRAMUSCULAR | Status: DC
  Administered 2020-03-31: 10000 [IU] via SUBCUTANEOUS

## 2020-04-04 DIAGNOSIS — H353131 Nonexudative age-related macular degeneration, bilateral, early dry stage: Secondary | ICD-10-CM | POA: Diagnosis not present

## 2020-04-04 DIAGNOSIS — H5203 Hypermetropia, bilateral: Secondary | ICD-10-CM | POA: Diagnosis not present

## 2020-04-04 DIAGNOSIS — H04123 Dry eye syndrome of bilateral lacrimal glands: Secondary | ICD-10-CM | POA: Diagnosis not present

## 2020-04-07 DIAGNOSIS — M545 Low back pain, unspecified: Secondary | ICD-10-CM | POA: Diagnosis not present

## 2020-04-18 DIAGNOSIS — M5416 Radiculopathy, lumbar region: Secondary | ICD-10-CM | POA: Diagnosis not present

## 2020-04-18 DIAGNOSIS — M5136 Other intervertebral disc degeneration, lumbar region: Secondary | ICD-10-CM | POA: Diagnosis not present

## 2020-04-21 DIAGNOSIS — M5416 Radiculopathy, lumbar region: Secondary | ICD-10-CM | POA: Diagnosis not present

## 2020-04-21 DIAGNOSIS — M5136 Other intervertebral disc degeneration, lumbar region: Secondary | ICD-10-CM | POA: Diagnosis not present

## 2020-04-24 DIAGNOSIS — E1165 Type 2 diabetes mellitus with hyperglycemia: Secondary | ICD-10-CM | POA: Diagnosis not present

## 2020-04-27 DIAGNOSIS — M5137 Other intervertebral disc degeneration, lumbosacral region: Secondary | ICD-10-CM | POA: Diagnosis not present

## 2020-04-28 ENCOUNTER — Ambulatory Visit (HOSPITAL_COMMUNITY)
Admission: RE | Admit: 2020-04-28 | Discharge: 2020-04-28 | Disposition: A | Discharge status: Home / Self Care | Payer: Medicare PPO | Source: Ambulatory Visit | Attending: Nephrology | Admitting: Nephrology

## 2020-04-28 ENCOUNTER — Other Ambulatory Visit: Payer: Self-pay

## 2020-04-28 VITALS — BP 127/58 | HR 58 | Temp 97.7°F | Resp 20

## 2020-04-28 DIAGNOSIS — N179 Acute kidney failure, unspecified: Secondary | ICD-10-CM | POA: Insufficient documentation

## 2020-04-28 LAB — COMPREHENSIVE METABOLIC PANEL
ALT: 63 U/L — ABNORMAL HIGH (ref 0–44)
AST: 41 U/L (ref 15–41)
Albumin: 3.5 g/dL (ref 3.5–5.0)
Alkaline Phosphatase: 101 U/L (ref 38–126)
Anion gap: 8 (ref 5–15)
BUN: 56 mg/dL — ABNORMAL HIGH (ref 8–23)
CO2: 26 mmol/L (ref 22–32)
Calcium: 9.2 mg/dL (ref 8.9–10.3)
Chloride: 107 mmol/L (ref 98–111)
Creatinine, Ser: 3.78 mg/dL — ABNORMAL HIGH (ref 0.44–1.00)
GFR, Estimated: 11 mL/min — ABNORMAL LOW (ref >60)
Glucose, Bld: 143 mg/dL — ABNORMAL HIGH (ref 70–99)
Potassium: 4.6 mmol/L (ref 3.5–5.1)
Sodium: 141 mmol/L (ref 135–145)
Total Bilirubin: 0.8 mg/dL (ref 0.3–1.2)
Total Protein: 6.4 g/dL — ABNORMAL LOW (ref 6.5–8.1)

## 2020-04-28 LAB — CBC WITH DIFFERENTIAL/PLATELET
Abs Immature Granulocytes: 0.04 10*3/uL (ref 0.00–0.07)
Basophils Absolute: 0 10*3/uL (ref 0.0–0.1)
Basophils Relative: 0 %
Eosinophils Absolute: 0.3 10*3/uL (ref 0.0–0.5)
Eosinophils Relative: 3 %
HCT: 37.6 % (ref 36.0–46.0)
Hemoglobin: 11.8 g/dL — ABNORMAL LOW (ref 12.0–15.0)
Immature Granulocytes: 0 %
Lymphocytes Relative: 19 %
Lymphs Abs: 1.7 10*3/uL (ref 0.7–4.0)
MCH: 27.6 pg (ref 26.0–34.0)
MCHC: 31.4 g/dL (ref 30.0–36.0)
MCV: 88.1 fL (ref 80.0–100.0)
Monocytes Absolute: 0.4 10*3/uL (ref 0.1–1.0)
Monocytes Relative: 4 %
Neutro Abs: 6.8 10*3/uL (ref 1.7–7.7)
Neutrophils Relative %: 74 %
Platelets: 301 10*3/uL (ref 150–400)
RBC: 4.27 MIL/uL (ref 3.87–5.11)
RDW: 14.9 % (ref 11.5–15.5)
WBC: 9.3 10*3/uL (ref 4.0–10.5)
nRBC: 0 % (ref 0.0–0.2)

## 2020-04-28 LAB — PHOSPHORUS: Phosphorus: 5.3 mg/dL — ABNORMAL HIGH (ref 2.5–4.6)

## 2020-04-28 LAB — POCT HEMOGLOBIN-HEMACUE: Hemoglobin: 11.8 g/dL — ABNORMAL LOW (ref 12.0–15.0)

## 2020-04-28 MED ORDER — EPOETIN ALFA-EPBX 10000 UNIT/ML IJ SOLN
INTRAMUSCULAR | Status: AC
  Filled 2020-04-28: qty 1

## 2020-04-28 MED ORDER — EPOETIN ALFA-EPBX 10000 UNIT/ML IJ SOLN
10000.0000 [IU] | INTRAMUSCULAR | Status: DC
  Administered 2020-04-28: 10000 [IU] via SUBCUTANEOUS

## 2020-05-09 DIAGNOSIS — N200 Calculus of kidney: Secondary | ICD-10-CM | POA: Diagnosis not present

## 2020-05-09 DIAGNOSIS — E785 Hyperlipidemia, unspecified: Secondary | ICD-10-CM | POA: Diagnosis not present

## 2020-05-09 DIAGNOSIS — N179 Acute kidney failure, unspecified: Secondary | ICD-10-CM | POA: Diagnosis not present

## 2020-05-09 DIAGNOSIS — N2581 Secondary hyperparathyroidism of renal origin: Secondary | ICD-10-CM | POA: Diagnosis not present

## 2020-05-09 DIAGNOSIS — I120 Hypertensive chronic kidney disease with stage 5 chronic kidney disease or end stage renal disease: Secondary | ICD-10-CM | POA: Diagnosis not present

## 2020-05-09 DIAGNOSIS — D631 Anemia in chronic kidney disease: Secondary | ICD-10-CM | POA: Diagnosis not present

## 2020-05-09 DIAGNOSIS — N185 Chronic kidney disease, stage 5: Secondary | ICD-10-CM | POA: Diagnosis not present

## 2020-05-09 DIAGNOSIS — N281 Cyst of kidney, acquired: Secondary | ICD-10-CM | POA: Diagnosis not present

## 2020-05-09 DIAGNOSIS — E139 Other specified diabetes mellitus without complications: Secondary | ICD-10-CM | POA: Diagnosis not present

## 2020-05-10 ENCOUNTER — Other Ambulatory Visit: Payer: Self-pay | Admitting: Nephrology

## 2020-05-10 DIAGNOSIS — N281 Cyst of kidney, acquired: Secondary | ICD-10-CM

## 2020-05-17 ENCOUNTER — Ambulatory Visit
Admission: RE | Admit: 2020-05-17 | Discharge: 2020-05-17 | Disposition: A | Discharge status: Home / Self Care | Payer: Medicare PPO | Source: Ambulatory Visit | Attending: Nephrology | Admitting: Nephrology

## 2020-05-17 DIAGNOSIS — N281 Cyst of kidney, acquired: Secondary | ICD-10-CM | POA: Diagnosis not present

## 2020-05-23 DIAGNOSIS — M5416 Radiculopathy, lumbar region: Secondary | ICD-10-CM | POA: Diagnosis not present

## 2020-05-23 DIAGNOSIS — M5136 Other intervertebral disc degeneration, lumbar region: Secondary | ICD-10-CM | POA: Diagnosis not present

## 2020-05-26 ENCOUNTER — Ambulatory Visit (HOSPITAL_COMMUNITY)
Admission: RE | Admit: 2020-05-26 | Discharge: 2020-05-26 | Disposition: A | Discharge status: Home / Self Care | Payer: Medicare PPO | Source: Ambulatory Visit | Attending: Nephrology | Admitting: Nephrology

## 2020-05-26 ENCOUNTER — Other Ambulatory Visit: Payer: Self-pay

## 2020-05-26 VITALS — BP 119/64 | HR 56 | Resp 18

## 2020-05-26 DIAGNOSIS — N185 Chronic kidney disease, stage 5: Secondary | ICD-10-CM | POA: Diagnosis not present

## 2020-05-26 DIAGNOSIS — D631 Anemia in chronic kidney disease: Secondary | ICD-10-CM | POA: Diagnosis not present

## 2020-05-26 DIAGNOSIS — N179 Acute kidney failure, unspecified: Secondary | ICD-10-CM

## 2020-05-26 LAB — CBC WITH DIFFERENTIAL/PLATELET
Abs Immature Granulocytes: 0.03 10*3/uL (ref 0.00–0.07)
Basophils Absolute: 0.1 10*3/uL (ref 0.0–0.1)
Basophils Relative: 1 %
Eosinophils Absolute: 0.3 10*3/uL (ref 0.0–0.5)
Eosinophils Relative: 3 %
HCT: 35.8 % — ABNORMAL LOW (ref 36.0–46.0)
Hemoglobin: 11.3 g/dL — ABNORMAL LOW (ref 12.0–15.0)
Immature Granulocytes: 0 %
Lymphocytes Relative: 16 %
Lymphs Abs: 1.6 10*3/uL (ref 0.7–4.0)
MCH: 28 pg (ref 26.0–34.0)
MCHC: 31.6 g/dL (ref 30.0–36.0)
MCV: 88.6 fL (ref 80.0–100.0)
Monocytes Absolute: 0.5 10*3/uL (ref 0.1–1.0)
Monocytes Relative: 5 %
Neutro Abs: 7.5 10*3/uL (ref 1.7–7.7)
Neutrophils Relative %: 75 %
Platelets: 298 10*3/uL (ref 150–400)
RBC: 4.04 MIL/uL (ref 3.87–5.11)
RDW: 14.9 % (ref 11.5–15.5)
WBC: 9.9 10*3/uL (ref 4.0–10.5)
nRBC: 0 % (ref 0.0–0.2)

## 2020-05-26 LAB — COMPREHENSIVE METABOLIC PANEL
ALT: 35 U/L (ref 0–44)
AST: 27 U/L (ref 15–41)
Albumin: 3.5 g/dL (ref 3.5–5.0)
Alkaline Phosphatase: 100 U/L (ref 38–126)
Anion gap: 8 (ref 5–15)
BUN: 61 mg/dL — ABNORMAL HIGH (ref 8–23)
CO2: 27 mmol/L (ref 22–32)
Calcium: 9.3 mg/dL (ref 8.9–10.3)
Chloride: 104 mmol/L (ref 98–111)
Creatinine, Ser: 3.87 mg/dL — ABNORMAL HIGH (ref 0.44–1.00)
GFR, Estimated: 11 mL/min — ABNORMAL LOW (ref >60)
Glucose, Bld: 202 mg/dL — ABNORMAL HIGH (ref 70–99)
Potassium: 4.2 mmol/L (ref 3.5–5.1)
Sodium: 139 mmol/L (ref 135–145)
Total Bilirubin: 0.8 mg/dL (ref 0.3–1.2)
Total Protein: 6.6 g/dL (ref 6.5–8.1)

## 2020-05-26 LAB — IRON AND TIBC
Iron: 64 ug/dL (ref 28–170)
Saturation Ratios: 19 % (ref 10.4–31.8)
TIBC: 332 ug/dL (ref 250–450)
UIBC: 268 ug/dL

## 2020-05-26 LAB — PHOSPHORUS: Phosphorus: 5 mg/dL — ABNORMAL HIGH (ref 2.5–4.6)

## 2020-05-26 LAB — POCT HEMOGLOBIN-HEMACUE: Hemoglobin: 10.6 g/dL — ABNORMAL LOW (ref 12.0–15.0)

## 2020-05-26 LAB — FERRITIN: Ferritin: 162 ng/mL (ref 11–307)

## 2020-05-26 MED ORDER — EPOETIN ALFA-EPBX 10000 UNIT/ML IJ SOLN
INTRAMUSCULAR | Status: AC
  Administered 2020-05-26: 10000 [IU]
  Filled 2020-05-26: qty 1

## 2020-05-26 MED ORDER — EPOETIN ALFA-EPBX 10000 UNIT/ML IJ SOLN: 10000.0000 [IU] | INTRAMUSCULAR | Status: DC

## 2020-05-27 LAB — PTH, INTACT AND CALCIUM
Calcium, Total (PTH): 9.2 mg/dL (ref 8.7–10.3)
PTH: 120 pg/mL — ABNORMAL HIGH (ref 15–65)

## 2020-06-02 DIAGNOSIS — Z23 Encounter for immunization: Secondary | ICD-10-CM | POA: Diagnosis not present

## 2020-06-23 ENCOUNTER — Other Ambulatory Visit: Payer: Self-pay

## 2020-06-23 ENCOUNTER — Ambulatory Visit (HOSPITAL_COMMUNITY)
Admission: RE | Admit: 2020-06-23 | Discharge: 2020-06-23 | Disposition: A | Discharge status: Home / Self Care | Payer: Medicare PPO | Source: Ambulatory Visit | Attending: Nephrology | Admitting: Nephrology

## 2020-06-23 VITALS — BP 125/69 | HR 55 | Temp 98.0°F | Resp 20

## 2020-06-23 DIAGNOSIS — N179 Acute kidney failure, unspecified: Secondary | ICD-10-CM | POA: Insufficient documentation

## 2020-06-23 LAB — CBC WITH DIFFERENTIAL/PLATELET
Abs Immature Granulocytes: 0.03 10*3/uL (ref 0.00–0.07)
Basophils Absolute: 0.1 10*3/uL (ref 0.0–0.1)
Basophils Relative: 1 %
Eosinophils Absolute: 0.3 10*3/uL (ref 0.0–0.5)
Eosinophils Relative: 3 %
HCT: 33.1 % — ABNORMAL LOW (ref 36.0–46.0)
Hemoglobin: 10.4 g/dL — ABNORMAL LOW (ref 12.0–15.0)
Immature Granulocytes: 0 %
Lymphocytes Relative: 18 %
Lymphs Abs: 1.7 10*3/uL (ref 0.7–4.0)
MCH: 28.1 pg (ref 26.0–34.0)
MCHC: 31.4 g/dL (ref 30.0–36.0)
MCV: 89.5 fL (ref 80.0–100.0)
Monocytes Absolute: 0.6 10*3/uL (ref 0.1–1.0)
Monocytes Relative: 6 %
Neutro Abs: 6.9 10*3/uL (ref 1.7–7.7)
Neutrophils Relative %: 72 %
Platelets: 267 10*3/uL (ref 150–400)
RBC: 3.7 MIL/uL — ABNORMAL LOW (ref 3.87–5.11)
RDW: 16 % — ABNORMAL HIGH (ref 11.5–15.5)
WBC: 9.5 10*3/uL (ref 4.0–10.5)
nRBC: 0 % (ref 0.0–0.2)

## 2020-06-23 LAB — COMPREHENSIVE METABOLIC PANEL
ALT: 27 U/L (ref 0–44)
AST: 22 U/L (ref 15–41)
Albumin: 3.3 g/dL — ABNORMAL LOW (ref 3.5–5.0)
Alkaline Phosphatase: 96 U/L (ref 38–126)
Anion gap: 8 (ref 5–15)
BUN: 56 mg/dL — ABNORMAL HIGH (ref 8–23)
CO2: 23 mmol/L (ref 22–32)
Calcium: 8.7 mg/dL — ABNORMAL LOW (ref 8.9–10.3)
Chloride: 107 mmol/L (ref 98–111)
Creatinine, Ser: 4.11 mg/dL — ABNORMAL HIGH (ref 0.44–1.00)
GFR, Estimated: 10 mL/min — ABNORMAL LOW (ref >60)
Glucose, Bld: 255 mg/dL — ABNORMAL HIGH (ref 70–99)
Potassium: 4.2 mmol/L (ref 3.5–5.1)
Sodium: 138 mmol/L (ref 135–145)
Total Bilirubin: 0.6 mg/dL (ref 0.3–1.2)
Total Protein: 6.1 g/dL — ABNORMAL LOW (ref 6.5–8.1)

## 2020-06-23 LAB — POCT HEMOGLOBIN-HEMACUE: Hemoglobin: 10.3 g/dL — ABNORMAL LOW (ref 12.0–15.0)

## 2020-06-23 LAB — PHOSPHORUS: Phosphorus: 5.8 mg/dL — ABNORMAL HIGH (ref 2.5–4.6)

## 2020-06-23 MED ORDER — EPOETIN ALFA-EPBX 10000 UNIT/ML IJ SOLN
INTRAMUSCULAR | Status: AC
  Administered 2020-06-23: 10000 [IU] via SUBCUTANEOUS
  Filled 2020-06-23: qty 1

## 2020-06-23 MED ORDER — EPOETIN ALFA-EPBX 10000 UNIT/ML IJ SOLN
10000.0000 [IU] | INTRAMUSCULAR | Status: DC
Start: 2020-06-23 — End: 2020-06-24

## 2020-07-21 ENCOUNTER — Ambulatory Visit (HOSPITAL_COMMUNITY)
Admission: RE | Admit: 2020-07-21 | Discharge: 2020-07-21 | Disposition: A | Discharge status: Home / Self Care | Payer: Medicare PPO | Source: Ambulatory Visit | Attending: Nephrology | Admitting: Nephrology

## 2020-07-21 ENCOUNTER — Other Ambulatory Visit: Payer: Self-pay

## 2020-07-21 VITALS — BP 123/64 | HR 58 | Temp 97.8°F | Resp 18

## 2020-07-21 DIAGNOSIS — D631 Anemia in chronic kidney disease: Secondary | ICD-10-CM | POA: Insufficient documentation

## 2020-07-21 DIAGNOSIS — N185 Chronic kidney disease, stage 5: Secondary | ICD-10-CM | POA: Diagnosis not present

## 2020-07-21 DIAGNOSIS — N179 Acute kidney failure, unspecified: Secondary | ICD-10-CM | POA: Insufficient documentation

## 2020-07-21 LAB — COMPREHENSIVE METABOLIC PANEL
ALT: 52 U/L — ABNORMAL HIGH (ref 0–44)
AST: 32 U/L (ref 15–41)
Albumin: 3.2 g/dL — ABNORMAL LOW (ref 3.5–5.0)
Alkaline Phosphatase: 65 U/L (ref 38–126)
Anion gap: 10 (ref 5–15)
BUN: 57 mg/dL — ABNORMAL HIGH (ref 8–23)
CO2: 25 mmol/L (ref 22–32)
Calcium: 8.7 mg/dL — ABNORMAL LOW (ref 8.9–10.3)
Chloride: 105 mmol/L (ref 98–111)
Creatinine, Ser: 4.03 mg/dL — ABNORMAL HIGH (ref 0.44–1.00)
GFR, Estimated: 10 mL/min — ABNORMAL LOW (ref >60)
Glucose, Bld: 124 mg/dL — ABNORMAL HIGH (ref 70–99)
Potassium: 4 mmol/L (ref 3.5–5.1)
Sodium: 140 mmol/L (ref 135–145)
Total Bilirubin: 0.9 mg/dL (ref 0.3–1.2)
Total Protein: 6.2 g/dL — ABNORMAL LOW (ref 6.5–8.1)

## 2020-07-21 LAB — CBC WITH DIFFERENTIAL/PLATELET
Abs Immature Granulocytes: 0.04 10*3/uL (ref 0.00–0.07)
Basophils Absolute: 0.1 10*3/uL (ref 0.0–0.1)
Basophils Relative: 1 %
Eosinophils Absolute: 0.2 10*3/uL (ref 0.0–0.5)
Eosinophils Relative: 3 %
HCT: 33.1 % — ABNORMAL LOW (ref 36.0–46.0)
Hemoglobin: 10.5 g/dL — ABNORMAL LOW (ref 12.0–15.0)
Immature Granulocytes: 0 %
Lymphocytes Relative: 24 %
Lymphs Abs: 2.1 10*3/uL (ref 0.7–4.0)
MCH: 27.9 pg (ref 26.0–34.0)
MCHC: 31.7 g/dL (ref 30.0–36.0)
MCV: 87.8 fL (ref 80.0–100.0)
Monocytes Absolute: 0.5 10*3/uL (ref 0.1–1.0)
Monocytes Relative: 6 %
Neutro Abs: 6 10*3/uL (ref 1.7–7.7)
Neutrophils Relative %: 66 %
Platelets: 279 10*3/uL (ref 150–400)
RBC: 3.77 MIL/uL — ABNORMAL LOW (ref 3.87–5.11)
RDW: 15.8 % — ABNORMAL HIGH (ref 11.5–15.5)
WBC: 9 10*3/uL (ref 4.0–10.5)
nRBC: 0 % (ref 0.0–0.2)

## 2020-07-21 LAB — IRON AND TIBC
Iron: 50 ug/dL (ref 28–170)
Saturation Ratios: 16 % (ref 10.4–31.8)
TIBC: 309 ug/dL (ref 250–450)
UIBC: 259 ug/dL

## 2020-07-21 LAB — PHOSPHORUS: Phosphorus: 5.3 mg/dL — ABNORMAL HIGH (ref 2.5–4.6)

## 2020-07-21 LAB — FERRITIN: Ferritin: 140 ng/mL (ref 11–307)

## 2020-07-21 LAB — POCT HEMOGLOBIN-HEMACUE: Hemoglobin: 10.5 g/dL — ABNORMAL LOW (ref 12.0–15.0)

## 2020-07-21 MED ORDER — EPOETIN ALFA-EPBX 10000 UNIT/ML IJ SOLN
INTRAMUSCULAR | Status: AC
  Administered 2020-07-21: 10000 [IU] via SUBCUTANEOUS
  Filled 2020-07-21: qty 1

## 2020-07-21 MED ORDER — EPOETIN ALFA-EPBX 10000 UNIT/ML IJ SOLN: 10000.0000 [IU] | INTRAMUSCULAR | Status: DC

## 2020-08-18 ENCOUNTER — Encounter (HOSPITAL_COMMUNITY): Payer: Medicare PPO

## 2020-08-22 ENCOUNTER — Ambulatory Visit (HOSPITAL_COMMUNITY)
Admission: RE | Admit: 2020-08-22 | Discharge: 2020-08-22 | Disposition: A | Discharge status: Home / Self Care | Payer: Medicare PPO | Source: Ambulatory Visit | Attending: Nephrology | Admitting: Nephrology

## 2020-08-22 VITALS — BP 119/59 | HR 59 | Temp 97.9°F | Resp 18

## 2020-08-22 DIAGNOSIS — N179 Acute kidney failure, unspecified: Secondary | ICD-10-CM | POA: Insufficient documentation

## 2020-08-22 LAB — COMPREHENSIVE METABOLIC PANEL
ALT: 47 U/L — ABNORMAL HIGH (ref 0–44)
AST: 36 U/L (ref 15–41)
Albumin: 3.1 g/dL — ABNORMAL LOW (ref 3.5–5.0)
Alkaline Phosphatase: 65 U/L (ref 38–126)
Anion gap: 10 (ref 5–15)
BUN: 55 mg/dL — ABNORMAL HIGH (ref 8–23)
CO2: 23 mmol/L (ref 22–32)
Calcium: 8.9 mg/dL (ref 8.9–10.3)
Chloride: 103 mmol/L (ref 98–111)
Creatinine, Ser: 4.04 mg/dL — ABNORMAL HIGH (ref 0.44–1.00)
GFR, Estimated: 10 mL/min — ABNORMAL LOW (ref >60)
Glucose, Bld: 261 mg/dL — ABNORMAL HIGH (ref 70–99)
Potassium: 4.8 mmol/L (ref 3.5–5.1)
Sodium: 136 mmol/L (ref 135–145)
Total Bilirubin: 0.9 mg/dL (ref 0.3–1.2)
Total Protein: 5.8 g/dL — ABNORMAL LOW (ref 6.5–8.1)

## 2020-08-22 LAB — CBC WITH DIFFERENTIAL/PLATELET
Abs Immature Granulocytes: 0.03 10*3/uL (ref 0.00–0.07)
Basophils Absolute: 0 10*3/uL (ref 0.0–0.1)
Basophils Relative: 0 %
Eosinophils Absolute: 0.2 10*3/uL (ref 0.0–0.5)
Eosinophils Relative: 3 %
HCT: 34 % — ABNORMAL LOW (ref 36.0–46.0)
Hemoglobin: 10.5 g/dL — ABNORMAL LOW (ref 12.0–15.0)
Immature Granulocytes: 0 %
Lymphocytes Relative: 15 %
Lymphs Abs: 1.4 10*3/uL (ref 0.7–4.0)
MCH: 28.5 pg (ref 26.0–34.0)
MCHC: 30.9 g/dL (ref 30.0–36.0)
MCV: 92.1 fL (ref 80.0–100.0)
Monocytes Absolute: 0.4 10*3/uL (ref 0.1–1.0)
Monocytes Relative: 5 %
Neutro Abs: 7 10*3/uL (ref 1.7–7.7)
Neutrophils Relative %: 77 %
Platelets: 250 10*3/uL (ref 150–400)
RBC: 3.69 MIL/uL — ABNORMAL LOW (ref 3.87–5.11)
RDW: 15.9 % — ABNORMAL HIGH (ref 11.5–15.5)
WBC: 9.1 10*3/uL (ref 4.0–10.5)
nRBC: 0 % (ref 0.0–0.2)

## 2020-08-22 LAB — PHOSPHORUS: Phosphorus: 4.6 mg/dL (ref 2.5–4.6)

## 2020-08-22 LAB — POCT HEMOGLOBIN-HEMACUE: Hemoglobin: 10.7 g/dL — ABNORMAL LOW (ref 12.0–15.0)

## 2020-08-22 MED ORDER — EPOETIN ALFA-EPBX 10000 UNIT/ML IJ SOLN
10000.0000 [IU] | INTRAMUSCULAR | Status: DC
  Administered 2020-08-22: 10000 [IU] via SUBCUTANEOUS

## 2020-08-22 MED ORDER — EPOETIN ALFA-EPBX 10000 UNIT/ML IJ SOLN
INTRAMUSCULAR | Status: AC
  Filled 2020-08-22: qty 2

## 2020-08-23 LAB — PTH, INTACT AND CALCIUM
Calcium, Total (PTH): 8.6 mg/dL — ABNORMAL LOW (ref 8.7–10.3)
PTH: 171 pg/mL — ABNORMAL HIGH (ref 15–65)

## 2020-09-04 ENCOUNTER — Other Ambulatory Visit: Payer: Self-pay | Admitting: Physician Assistant

## 2020-09-04 DIAGNOSIS — R197 Diarrhea, unspecified: Secondary | ICD-10-CM

## 2020-09-07 ENCOUNTER — Ambulatory Visit
Admission: RE | Admit: 2020-09-07 | Discharge: 2020-09-07 | Disposition: A | Discharge status: Home / Self Care | Payer: Medicare PPO | Source: Ambulatory Visit | Attending: Physician Assistant | Admitting: Physician Assistant

## 2020-09-07 ENCOUNTER — Other Ambulatory Visit: Payer: Self-pay

## 2020-09-07 DIAGNOSIS — R197 Diarrhea, unspecified: Secondary | ICD-10-CM

## 2020-09-12 ENCOUNTER — Other Ambulatory Visit: Payer: Self-pay | Admitting: General Surgery

## 2020-09-12 DIAGNOSIS — Z1231 Encounter for screening mammogram for malignant neoplasm of breast: Secondary | ICD-10-CM

## 2020-09-19 ENCOUNTER — Encounter (HOSPITAL_COMMUNITY): Payer: Medicare PPO

## 2020-09-22 ENCOUNTER — Encounter (HOSPITAL_COMMUNITY)
Admission: RE | Admit: 2020-09-22 | Discharge: 2020-09-22 | Disposition: A | Discharge status: Home / Self Care | Payer: Medicare PPO | Source: Ambulatory Visit | Attending: Nephrology | Admitting: Nephrology

## 2020-09-22 ENCOUNTER — Other Ambulatory Visit: Payer: Self-pay

## 2020-09-22 VITALS — BP 121/58 | HR 61 | Temp 97.8°F | Resp 20

## 2020-09-22 DIAGNOSIS — N179 Acute kidney failure, unspecified: Secondary | ICD-10-CM

## 2020-09-22 DIAGNOSIS — D631 Anemia in chronic kidney disease: Secondary | ICD-10-CM | POA: Insufficient documentation

## 2020-09-22 DIAGNOSIS — N185 Chronic kidney disease, stage 5: Secondary | ICD-10-CM | POA: Diagnosis not present

## 2020-09-22 LAB — COMPREHENSIVE METABOLIC PANEL
ALT: 87 U/L — ABNORMAL HIGH (ref 0–44)
AST: 58 U/L — ABNORMAL HIGH (ref 15–41)
Albumin: 3.1 g/dL — ABNORMAL LOW (ref 3.5–5.0)
Alkaline Phosphatase: 54 U/L (ref 38–126)
Anion gap: 10 (ref 5–15)
BUN: 98 mg/dL — ABNORMAL HIGH (ref 8–23)
CO2: 20 mmol/L — ABNORMAL LOW (ref 22–32)
Calcium: 8.5 mg/dL — ABNORMAL LOW (ref 8.9–10.3)
Chloride: 108 mmol/L (ref 98–111)
Creatinine, Ser: 4.36 mg/dL — ABNORMAL HIGH (ref 0.44–1.00)
GFR, Estimated: 10 mL/min — ABNORMAL LOW (ref >60)
Glucose, Bld: 178 mg/dL — ABNORMAL HIGH (ref 70–99)
Potassium: 3.6 mmol/L (ref 3.5–5.1)
Sodium: 138 mmol/L (ref 135–145)
Total Bilirubin: 1.3 mg/dL — ABNORMAL HIGH (ref 0.3–1.2)
Total Protein: 5.8 g/dL — ABNORMAL LOW (ref 6.5–8.1)

## 2020-09-22 LAB — IRON AND TIBC
Iron: 70 ug/dL (ref 28–170)
Saturation Ratios: 23 % (ref 10.4–31.8)
TIBC: 304 ug/dL (ref 250–450)
UIBC: 234 ug/dL

## 2020-09-22 LAB — CBC WITH DIFFERENTIAL/PLATELET
Abs Immature Granulocytes: 0.13 10*3/uL — ABNORMAL HIGH (ref 0.00–0.07)
Basophils Absolute: 0 10*3/uL (ref 0.0–0.1)
Basophils Relative: 0 %
Eosinophils Absolute: 0.1 10*3/uL (ref 0.0–0.5)
Eosinophils Relative: 1 %
HCT: 34.8 % — ABNORMAL LOW (ref 36.0–46.0)
Hemoglobin: 11.3 g/dL — ABNORMAL LOW (ref 12.0–15.0)
Immature Granulocytes: 1 %
Lymphocytes Relative: 17 %
Lymphs Abs: 1.9 10*3/uL (ref 0.7–4.0)
MCH: 28.3 pg (ref 26.0–34.0)
MCHC: 32.5 g/dL (ref 30.0–36.0)
MCV: 87.2 fL (ref 80.0–100.0)
Monocytes Absolute: 0.5 10*3/uL (ref 0.1–1.0)
Monocytes Relative: 5 %
Neutro Abs: 8.6 10*3/uL — ABNORMAL HIGH (ref 1.7–7.7)
Neutrophils Relative %: 76 %
Platelets: 287 10*3/uL (ref 150–400)
RBC: 3.99 MIL/uL (ref 3.87–5.11)
RDW: 16.1 % — ABNORMAL HIGH (ref 11.5–15.5)
WBC: 11.2 10*3/uL — ABNORMAL HIGH (ref 4.0–10.5)
nRBC: 0 % (ref 0.0–0.2)

## 2020-09-22 LAB — POCT HEMOGLOBIN-HEMACUE: Hemoglobin: 11.7 g/dL — ABNORMAL LOW (ref 12.0–15.0)

## 2020-09-22 LAB — FERRITIN: Ferritin: 323 ng/mL — ABNORMAL HIGH (ref 11–307)

## 2020-09-22 LAB — PHOSPHORUS: Phosphorus: 5.1 mg/dL — ABNORMAL HIGH (ref 2.5–4.6)

## 2020-09-22 MED ORDER — EPOETIN ALFA-EPBX 10000 UNIT/ML IJ SOLN
INTRAMUSCULAR | Status: AC
  Filled 2020-09-22: qty 1

## 2020-09-22 MED ORDER — EPOETIN ALFA-EPBX 10000 UNIT/ML IJ SOLN
10000.0000 [IU] | INTRAMUSCULAR | Status: DC
  Administered 2020-09-22: 10000 [IU] via SUBCUTANEOUS

## 2020-10-16 ENCOUNTER — Other Ambulatory Visit: Payer: Self-pay

## 2020-10-16 ENCOUNTER — Ambulatory Visit: Payer: Medicare PPO | Admitting: Dermatology

## 2020-10-16 ENCOUNTER — Encounter: Payer: Self-pay | Admitting: Dermatology

## 2020-10-16 DIAGNOSIS — Z1283 Encounter for screening for malignant neoplasm of skin: Secondary | ICD-10-CM | POA: Diagnosis not present

## 2020-10-16 DIAGNOSIS — L570 Actinic keratosis: Secondary | ICD-10-CM | POA: Diagnosis not present

## 2020-10-16 DIAGNOSIS — L820 Inflamed seborrheic keratosis: Secondary | ICD-10-CM | POA: Diagnosis not present

## 2020-10-20 ENCOUNTER — Encounter (HOSPITAL_COMMUNITY)
Admission: RE | Admit: 2020-10-20 | Discharge: 2020-10-20 | Disposition: A | Discharge status: Home / Self Care | Payer: Medicare PPO | Source: Ambulatory Visit | Attending: Nephrology | Admitting: Nephrology

## 2020-10-20 VITALS — BP 132/66 | HR 61 | Temp 97.6°F | Resp 19

## 2020-10-20 DIAGNOSIS — N179 Acute kidney failure, unspecified: Secondary | ICD-10-CM | POA: Diagnosis not present

## 2020-10-20 LAB — CBC WITH DIFFERENTIAL/PLATELET
Abs Immature Granulocytes: 0.04 10*3/uL (ref 0.00–0.07)
Basophils Absolute: 0.1 10*3/uL (ref 0.0–0.1)
Basophils Relative: 1 %
Eosinophils Absolute: 0.3 10*3/uL (ref 0.0–0.5)
Eosinophils Relative: 4 %
HCT: 33.9 % — ABNORMAL LOW (ref 36.0–46.0)
Hemoglobin: 10.4 g/dL — ABNORMAL LOW (ref 12.0–15.0)
Immature Granulocytes: 1 %
Lymphocytes Relative: 21 %
Lymphs Abs: 1.5 10*3/uL (ref 0.7–4.0)
MCH: 28.3 pg (ref 26.0–34.0)
MCHC: 30.7 g/dL (ref 30.0–36.0)
MCV: 92.1 fL (ref 80.0–100.0)
Monocytes Absolute: 0.5 10*3/uL (ref 0.1–1.0)
Monocytes Relative: 6 %
Neutro Abs: 5 10*3/uL (ref 1.7–7.7)
Neutrophils Relative %: 67 %
Platelets: 286 10*3/uL (ref 150–400)
RBC: 3.68 MIL/uL — ABNORMAL LOW (ref 3.87–5.11)
RDW: 15.2 % (ref 11.5–15.5)
WBC: 7.4 10*3/uL (ref 4.0–10.5)
nRBC: 0 % (ref 0.0–0.2)

## 2020-10-20 LAB — POCT HEMOGLOBIN-HEMACUE: Hemoglobin: 10.4 g/dL — ABNORMAL LOW (ref 12.0–15.0)

## 2020-10-20 LAB — COMPREHENSIVE METABOLIC PANEL
ALT: 48 U/L — ABNORMAL HIGH (ref 0–44)
AST: 55 U/L — ABNORMAL HIGH (ref 15–41)
Albumin: 3 g/dL — ABNORMAL LOW (ref 3.5–5.0)
Alkaline Phosphatase: 59 U/L (ref 38–126)
Anion gap: 10 (ref 5–15)
BUN: 48 mg/dL — ABNORMAL HIGH (ref 8–23)
CO2: 24 mmol/L (ref 22–32)
Calcium: 9 mg/dL (ref 8.9–10.3)
Chloride: 106 mmol/L (ref 98–111)
Creatinine, Ser: 3.86 mg/dL — ABNORMAL HIGH (ref 0.44–1.00)
GFR, Estimated: 11 mL/min — ABNORMAL LOW (ref >60)
Glucose, Bld: 200 mg/dL — ABNORMAL HIGH (ref 70–99)
Potassium: 4 mmol/L (ref 3.5–5.1)
Sodium: 140 mmol/L (ref 135–145)
Total Bilirubin: 0.4 mg/dL (ref 0.3–1.2)
Total Protein: 5.7 g/dL — ABNORMAL LOW (ref 6.5–8.1)

## 2020-10-20 LAB — PHOSPHORUS: Phosphorus: 4.8 mg/dL — ABNORMAL HIGH (ref 2.5–4.6)

## 2020-10-20 MED ORDER — EPOETIN ALFA-EPBX 10000 UNIT/ML IJ SOLN
INTRAMUSCULAR | Status: AC
  Filled 2020-10-20: qty 1

## 2020-10-20 MED ORDER — EPOETIN ALFA-EPBX 10000 UNIT/ML IJ SOLN
10000.0000 [IU] | INTRAMUSCULAR | Status: DC
  Administered 2020-10-20: 10000 [IU] via SUBCUTANEOUS

## 2020-10-24 ENCOUNTER — Encounter: Payer: Self-pay | Admitting: Dermatology

## 2020-10-24 NOTE — Progress Notes (Signed)
   Follow-Up Visit   Subjective  Kim Burns is a 85 y.o. female who presents for the following: Annual Exam (Right and left arm, top of hand new rough spots, check back for scale lesions x months ).  General skin examination, patient mostly concerned about crusts on arms Location:  Duration:  Quality:  Associated Signs/Symptoms: Modifying Factors:  Severity:  Timing: Context:   Objective  Well appearing patient in no apparent distress; mood and affect are within normal limits. General skin examination: No atypical pigmented lesions or nonmelanoma skin cancer.  Left Arm (2), Right Arm, Torso - Posterior (Back) (2) Left arm, right arm, back x 2, right knee: Inflamed flattopped textured 6 mm papules, also found cyst on the back and left side of the face at the full body skin check   Left Arm (3), Right Arm (3) Half dozen gritty pink 2 to 4 mm crusts       A full examination was performed including scalp, head, eyes, ears, nose, lips, neck, chest, axillae, abdomen, back, buttocks, bilateral upper extremities, bilateral lower extremities, hands, feet, fingers, toes, fingernails, and toenails. All findings within normal limits unless otherwise noted below.  Areas beneath undergarments not fully examined.   Assessment & Plan    Inflamed seborrheic keratosis Torso - Posterior (Back) (2); Left Arm (2); Right Arm  Destruction of lesion - Torso - Posterior (Back) Complexity: simple   Destruction method: cryotherapy   Informed consent: discussed and consent obtained   Timeout:  patient name, date of birth, surgical site, and procedure verified Lesion destroyed using liquid nitrogen: Yes   Cryotherapy cycles:  3 Outcome: patient tolerated procedure well with no complications   Post-procedure details: wound care instructions given    AK (actinic keratosis) (6) Left Arm (3); Right Arm (3)  Destruction of lesion - Left Arm, Right Arm Complexity: simple   Destruction  method: cryotherapy   Informed consent: discussed and consent obtained   Timeout:  patient name, date of birth, surgical site, and procedure verified Lesion destroyed using liquid nitrogen: Yes   Cryotherapy cycles:  3 Outcome: patient tolerated procedure well with no complications   Post-procedure details: wound care instructions given    Encounter for screening for malignant neoplasm of skin  Annual skin examination.      I, Lavonna Monarch, MD, have reviewed all documentation for this visit.  The documentation on 10/24/20 for the exam, diagnosis, procedures, and orders are all accurate and complete.

## 2020-11-17 ENCOUNTER — Encounter (HOSPITAL_COMMUNITY): Payer: Medicare PPO

## 2020-11-20 DIAGNOSIS — E782 Mixed hyperlipidemia: Secondary | ICD-10-CM | POA: Diagnosis not present

## 2020-11-20 DIAGNOSIS — R5383 Other fatigue: Secondary | ICD-10-CM | POA: Diagnosis not present

## 2020-11-20 DIAGNOSIS — D649 Anemia, unspecified: Secondary | ICD-10-CM | POA: Diagnosis not present

## 2020-11-22 DIAGNOSIS — N186 End stage renal disease: Secondary | ICD-10-CM | POA: Diagnosis not present

## 2020-11-22 DIAGNOSIS — D631 Anemia in chronic kidney disease: Secondary | ICD-10-CM | POA: Diagnosis not present

## 2020-11-22 DIAGNOSIS — E109 Type 1 diabetes mellitus without complications: Secondary | ICD-10-CM | POA: Diagnosis not present

## 2020-11-22 DIAGNOSIS — I10 Essential (primary) hypertension: Secondary | ICD-10-CM | POA: Diagnosis not present

## 2020-11-22 DIAGNOSIS — D649 Anemia, unspecified: Secondary | ICD-10-CM | POA: Diagnosis not present

## 2020-11-24 ENCOUNTER — Ambulatory Visit (HOSPITAL_COMMUNITY)
Admission: RE | Admit: 2020-11-24 | Discharge: 2020-11-24 | Disposition: A | Discharge status: Home / Self Care | Payer: Medicare PPO | Source: Ambulatory Visit | Attending: Nephrology | Admitting: Nephrology

## 2020-11-24 ENCOUNTER — Other Ambulatory Visit: Payer: Self-pay

## 2020-11-24 VITALS — BP 141/70 | HR 65 | Temp 97.3°F | Resp 18

## 2020-11-24 DIAGNOSIS — N185 Chronic kidney disease, stage 5: Secondary | ICD-10-CM | POA: Insufficient documentation

## 2020-11-24 DIAGNOSIS — D631 Anemia in chronic kidney disease: Secondary | ICD-10-CM | POA: Diagnosis not present

## 2020-11-24 DIAGNOSIS — N179 Acute kidney failure, unspecified: Secondary | ICD-10-CM | POA: Insufficient documentation

## 2020-11-24 LAB — FERRITIN: Ferritin: 168 ng/mL (ref 11–307)

## 2020-11-24 LAB — CBC WITH DIFFERENTIAL/PLATELET
Abs Immature Granulocytes: 0.07 10*3/uL (ref 0.00–0.07)
Basophils Absolute: 0.1 10*3/uL (ref 0.0–0.1)
Basophils Relative: 1 %
Eosinophils Absolute: 1.4 10*3/uL — ABNORMAL HIGH (ref 0.0–0.5)
Eosinophils Relative: 11 %
HCT: 31.4 % — ABNORMAL LOW (ref 36.0–46.0)
Hemoglobin: 9.4 g/dL — ABNORMAL LOW (ref 12.0–15.0)
Immature Granulocytes: 1 %
Lymphocytes Relative: 14 %
Lymphs Abs: 1.7 10*3/uL (ref 0.7–4.0)
MCH: 26.9 pg (ref 26.0–34.0)
MCHC: 29.9 g/dL — ABNORMAL LOW (ref 30.0–36.0)
MCV: 89.7 fL (ref 80.0–100.0)
Monocytes Absolute: 0.7 10*3/uL (ref 0.1–1.0)
Monocytes Relative: 5 %
Neutro Abs: 8.6 10*3/uL — ABNORMAL HIGH (ref 1.7–7.7)
Neutrophils Relative %: 68 %
Platelets: 336 10*3/uL (ref 150–400)
RBC: 3.5 MIL/uL — ABNORMAL LOW (ref 3.87–5.11)
RDW: 15.2 % (ref 11.5–15.5)
WBC: 12.5 10*3/uL — ABNORMAL HIGH (ref 4.0–10.5)
nRBC: 0 % (ref 0.0–0.2)

## 2020-11-24 LAB — PHOSPHORUS: Phosphorus: 5 mg/dL — ABNORMAL HIGH (ref 2.5–4.6)

## 2020-11-24 LAB — COMPREHENSIVE METABOLIC PANEL
ALT: 43 U/L (ref 0–44)
AST: 36 U/L (ref 15–41)
Albumin: 2.8 g/dL — ABNORMAL LOW (ref 3.5–5.0)
Alkaline Phosphatase: 67 U/L (ref 38–126)
Anion gap: 10 (ref 5–15)
BUN: 54 mg/dL — ABNORMAL HIGH (ref 8–23)
CO2: 22 mmol/L (ref 22–32)
Calcium: 9.2 mg/dL (ref 8.9–10.3)
Chloride: 108 mmol/L (ref 98–111)
Creatinine, Ser: 4.16 mg/dL — ABNORMAL HIGH (ref 0.44–1.00)
GFR, Estimated: 10 mL/min — ABNORMAL LOW (ref >60)
Glucose, Bld: 152 mg/dL — ABNORMAL HIGH (ref 70–99)
Potassium: 4.4 mmol/L (ref 3.5–5.1)
Sodium: 140 mmol/L (ref 135–145)
Total Bilirubin: 0.6 mg/dL (ref 0.3–1.2)
Total Protein: 6.2 g/dL — ABNORMAL LOW (ref 6.5–8.1)

## 2020-11-24 LAB — IRON AND TIBC
Iron: 37 ug/dL (ref 28–170)
Saturation Ratios: 13 % (ref 10.4–31.8)
TIBC: 291 ug/dL (ref 250–450)
UIBC: 254 ug/dL

## 2020-11-24 MED ORDER — EPOETIN ALFA-EPBX 10000 UNIT/ML IJ SOLN
INTRAMUSCULAR | Status: AC
  Filled 2020-11-24: qty 1

## 2020-11-24 MED ORDER — EPOETIN ALFA-EPBX 10000 UNIT/ML IJ SOLN
10000.0000 [IU] | INTRAMUSCULAR | Status: DC
  Administered 2020-11-24: 10000 [IU] via SUBCUTANEOUS

## 2020-11-25 LAB — PTH, INTACT AND CALCIUM
Calcium, Total (PTH): 9 mg/dL (ref 8.7–10.3)
PTH: 89 pg/mL — ABNORMAL HIGH (ref 15–65)

## 2020-11-28 LAB — POCT HEMOGLOBIN-HEMACUE: Hemoglobin: 9.6 g/dL — ABNORMAL LOW (ref 12.0–15.0)

## 2020-12-08 ENCOUNTER — Other Ambulatory Visit: Payer: Self-pay

## 2020-12-08 ENCOUNTER — Ambulatory Visit
Admission: RE | Admit: 2020-12-08 | Discharge: 2020-12-08 | Disposition: A | Discharge status: Home / Self Care | Payer: Medicare PPO | Source: Ambulatory Visit | Attending: General Surgery | Admitting: General Surgery

## 2020-12-08 DIAGNOSIS — Z1231 Encounter for screening mammogram for malignant neoplasm of breast: Secondary | ICD-10-CM | POA: Diagnosis not present

## 2020-12-15 ENCOUNTER — Encounter (HOSPITAL_COMMUNITY): Payer: Medicare PPO

## 2020-12-22 ENCOUNTER — Encounter (HOSPITAL_COMMUNITY): Payer: Medicare PPO

## 2020-12-29 ENCOUNTER — Other Ambulatory Visit: Payer: Self-pay

## 2020-12-29 ENCOUNTER — Ambulatory Visit (HOSPITAL_COMMUNITY)
Admission: RE | Admit: 2020-12-29 | Discharge: 2020-12-29 | Disposition: A | Discharge status: Home / Self Care | Payer: Medicare PPO | Source: Ambulatory Visit | Attending: Nephrology | Admitting: Nephrology

## 2020-12-29 VITALS — BP 130/57 | HR 63 | Temp 98.3°F | Resp 20

## 2020-12-29 DIAGNOSIS — N179 Acute kidney failure, unspecified: Secondary | ICD-10-CM | POA: Diagnosis not present

## 2020-12-29 LAB — CBC WITH DIFFERENTIAL/PLATELET
Abs Immature Granulocytes: 0.05 10*3/uL (ref 0.00–0.07)
Basophils Absolute: 0.1 10*3/uL (ref 0.0–0.1)
Basophils Relative: 1 %
Eosinophils Absolute: 0.5 10*3/uL (ref 0.0–0.5)
Eosinophils Relative: 5 %
HCT: 27.8 % — ABNORMAL LOW (ref 36.0–46.0)
Hemoglobin: 8.6 g/dL — ABNORMAL LOW (ref 12.0–15.0)
Immature Granulocytes: 1 %
Lymphocytes Relative: 18 %
Lymphs Abs: 1.6 10*3/uL (ref 0.7–4.0)
MCH: 27.3 pg (ref 26.0–34.0)
MCHC: 30.9 g/dL (ref 30.0–36.0)
MCV: 88.3 fL (ref 80.0–100.0)
Monocytes Absolute: 0.5 10*3/uL (ref 0.1–1.0)
Monocytes Relative: 5 %
Neutro Abs: 6.5 10*3/uL (ref 1.7–7.7)
Neutrophils Relative %: 70 %
Platelets: 285 10*3/uL (ref 150–400)
RBC: 3.15 MIL/uL — ABNORMAL LOW (ref 3.87–5.11)
RDW: 16.2 % — ABNORMAL HIGH (ref 11.5–15.5)
WBC: 9.1 10*3/uL (ref 4.0–10.5)
nRBC: 0 % (ref 0.0–0.2)

## 2020-12-29 LAB — COMPREHENSIVE METABOLIC PANEL
ALT: 50 U/L — ABNORMAL HIGH (ref 0–44)
AST: 29 U/L (ref 15–41)
Albumin: 3 g/dL — ABNORMAL LOW (ref 3.5–5.0)
Alkaline Phosphatase: 53 U/L (ref 38–126)
Anion gap: 11 (ref 5–15)
BUN: 58 mg/dL — ABNORMAL HIGH (ref 8–23)
CO2: 19 mmol/L — ABNORMAL LOW (ref 22–32)
Calcium: 8.9 mg/dL (ref 8.9–10.3)
Chloride: 110 mmol/L (ref 98–111)
Creatinine, Ser: 4.48 mg/dL — ABNORMAL HIGH (ref 0.44–1.00)
GFR, Estimated: 9 mL/min — ABNORMAL LOW (ref >60)
Glucose, Bld: 135 mg/dL — ABNORMAL HIGH (ref 70–99)
Potassium: 4.4 mmol/L (ref 3.5–5.1)
Sodium: 140 mmol/L (ref 135–145)
Total Bilirubin: 0.7 mg/dL (ref 0.3–1.2)
Total Protein: 6.2 g/dL — ABNORMAL LOW (ref 6.5–8.1)

## 2020-12-29 LAB — PHOSPHORUS: Phosphorus: 5.3 mg/dL — ABNORMAL HIGH (ref 2.5–4.6)

## 2020-12-29 LAB — POCT HEMOGLOBIN-HEMACUE: Hemoglobin: 8.7 g/dL — ABNORMAL LOW (ref 12.0–15.0)

## 2020-12-29 MED ORDER — EPOETIN ALFA-EPBX 10000 UNIT/ML IJ SOLN
10000.0000 [IU] | INTRAMUSCULAR | Status: DC
  Administered 2020-12-29: 10000 [IU] via SUBCUTANEOUS

## 2020-12-29 MED ORDER — EPOETIN ALFA-EPBX 10000 UNIT/ML IJ SOLN
INTRAMUSCULAR | Status: AC
  Filled 2020-12-29: qty 1

## 2021-01-02 DIAGNOSIS — Z23 Encounter for immunization: Secondary | ICD-10-CM | POA: Diagnosis not present

## 2021-01-02 DIAGNOSIS — E1165 Type 2 diabetes mellitus with hyperglycemia: Secondary | ICD-10-CM | POA: Diagnosis not present

## 2021-01-03 DIAGNOSIS — I120 Hypertensive chronic kidney disease with stage 5 chronic kidney disease or end stage renal disease: Secondary | ICD-10-CM | POA: Diagnosis not present

## 2021-01-03 DIAGNOSIS — N185 Chronic kidney disease, stage 5: Secondary | ICD-10-CM | POA: Diagnosis not present

## 2021-01-03 DIAGNOSIS — N2581 Secondary hyperparathyroidism of renal origin: Secondary | ICD-10-CM | POA: Diagnosis not present

## 2021-01-03 DIAGNOSIS — D631 Anemia in chronic kidney disease: Secondary | ICD-10-CM | POA: Diagnosis not present

## 2021-01-03 DIAGNOSIS — E139 Other specified diabetes mellitus without complications: Secondary | ICD-10-CM | POA: Diagnosis not present

## 2021-01-03 DIAGNOSIS — E891 Postprocedural hypoinsulinemia: Secondary | ICD-10-CM | POA: Diagnosis not present

## 2021-01-03 DIAGNOSIS — Z90410 Acquired total absence of pancreas: Secondary | ICD-10-CM | POA: Diagnosis not present

## 2021-01-03 DIAGNOSIS — N281 Cyst of kidney, acquired: Secondary | ICD-10-CM | POA: Diagnosis not present

## 2021-01-16 DIAGNOSIS — E1165 Type 2 diabetes mellitus with hyperglycemia: Secondary | ICD-10-CM | POA: Diagnosis not present

## 2021-01-16 DIAGNOSIS — N186 End stage renal disease: Secondary | ICD-10-CM | POA: Diagnosis not present

## 2021-01-16 DIAGNOSIS — I10 Essential (primary) hypertension: Secondary | ICD-10-CM | POA: Diagnosis not present

## 2021-01-17 DIAGNOSIS — N2581 Secondary hyperparathyroidism of renal origin: Secondary | ICD-10-CM | POA: Diagnosis not present

## 2021-01-17 DIAGNOSIS — I120 Hypertensive chronic kidney disease with stage 5 chronic kidney disease or end stage renal disease: Secondary | ICD-10-CM | POA: Diagnosis not present

## 2021-01-17 DIAGNOSIS — N2889 Other specified disorders of kidney and ureter: Secondary | ICD-10-CM | POA: Diagnosis not present

## 2021-01-17 DIAGNOSIS — N185 Chronic kidney disease, stage 5: Secondary | ICD-10-CM | POA: Diagnosis not present

## 2021-01-17 DIAGNOSIS — R140 Abdominal distension (gaseous): Secondary | ICD-10-CM | POA: Diagnosis not present

## 2021-01-17 DIAGNOSIS — I770 Arteriovenous fistula, acquired: Secondary | ICD-10-CM | POA: Diagnosis not present

## 2021-01-17 DIAGNOSIS — D631 Anemia in chronic kidney disease: Secondary | ICD-10-CM | POA: Diagnosis not present

## 2021-01-22 ENCOUNTER — Other Ambulatory Visit: Payer: Self-pay | Admitting: Nephrology

## 2021-01-22 DIAGNOSIS — D49511 Neoplasm of unspecified behavior of right kidney: Secondary | ICD-10-CM | POA: Diagnosis not present

## 2021-01-22 DIAGNOSIS — N2889 Other specified disorders of kidney and ureter: Secondary | ICD-10-CM

## 2021-01-23 DIAGNOSIS — D490 Neoplasm of unspecified behavior of digestive system: Secondary | ICD-10-CM | POA: Diagnosis not present

## 2021-01-23 DIAGNOSIS — K432 Incisional hernia without obstruction or gangrene: Secondary | ICD-10-CM | POA: Diagnosis not present

## 2021-01-23 DIAGNOSIS — K591 Functional diarrhea: Secondary | ICD-10-CM | POA: Diagnosis not present

## 2021-01-23 DIAGNOSIS — K8681 Exocrine pancreatic insufficiency: Secondary | ICD-10-CM | POA: Diagnosis not present

## 2021-01-25 ENCOUNTER — Other Ambulatory Visit (HOSPITAL_COMMUNITY): Payer: Self-pay | Admitting: *Deleted

## 2021-01-26 ENCOUNTER — Ambulatory Visit (HOSPITAL_COMMUNITY)
Admission: RE | Admit: 2021-01-26 | Discharge: 2021-01-26 | Disposition: A | Discharge status: Home / Self Care | Payer: Medicare PPO | Source: Ambulatory Visit | Attending: Nephrology | Admitting: Nephrology

## 2021-01-26 ENCOUNTER — Other Ambulatory Visit: Payer: Self-pay

## 2021-01-26 VITALS — BP 129/61 | HR 55 | Temp 97.6°F | Resp 20

## 2021-01-26 DIAGNOSIS — N179 Acute kidney failure, unspecified: Secondary | ICD-10-CM | POA: Insufficient documentation

## 2021-01-26 LAB — CBC WITH DIFFERENTIAL/PLATELET
Abs Immature Granulocytes: 0.02 10*3/uL (ref 0.00–0.07)
Basophils Absolute: 0.1 10*3/uL (ref 0.0–0.1)
Basophils Relative: 1 %
Eosinophils Absolute: 0.3 10*3/uL (ref 0.0–0.5)
Eosinophils Relative: 3 %
HCT: 33.1 % — ABNORMAL LOW (ref 36.0–46.0)
Hemoglobin: 9.9 g/dL — ABNORMAL LOW (ref 12.0–15.0)
Immature Granulocytes: 0 %
Lymphocytes Relative: 17 %
Lymphs Abs: 1.4 10*3/uL (ref 0.7–4.0)
MCH: 26.4 pg (ref 26.0–34.0)
MCHC: 29.9 g/dL — ABNORMAL LOW (ref 30.0–36.0)
MCV: 88.3 fL (ref 80.0–100.0)
Monocytes Absolute: 0.4 10*3/uL (ref 0.1–1.0)
Monocytes Relative: 5 %
Neutro Abs: 6.1 10*3/uL (ref 1.7–7.7)
Neutrophils Relative %: 74 %
Platelets: 294 10*3/uL (ref 150–400)
RBC: 3.75 MIL/uL — ABNORMAL LOW (ref 3.87–5.11)
RDW: 15.9 % — ABNORMAL HIGH (ref 11.5–15.5)
WBC: 8.3 10*3/uL (ref 4.0–10.5)
nRBC: 0 % (ref 0.0–0.2)

## 2021-01-26 LAB — COMPREHENSIVE METABOLIC PANEL
ALT: 52 U/L — ABNORMAL HIGH (ref 0–44)
AST: 42 U/L — ABNORMAL HIGH (ref 15–41)
Albumin: 3.4 g/dL — ABNORMAL LOW (ref 3.5–5.0)
Alkaline Phosphatase: 41 U/L (ref 38–126)
Anion gap: 12 (ref 5–15)
BUN: 71 mg/dL — ABNORMAL HIGH (ref 8–23)
CO2: 23 mmol/L (ref 22–32)
Calcium: 9.4 mg/dL (ref 8.9–10.3)
Chloride: 103 mmol/L (ref 98–111)
Creatinine, Ser: 4.7 mg/dL — ABNORMAL HIGH (ref 0.44–1.00)
GFR, Estimated: 9 mL/min — ABNORMAL LOW (ref >60)
Glucose, Bld: 138 mg/dL — ABNORMAL HIGH (ref 70–99)
Potassium: 4.9 mmol/L (ref 3.5–5.1)
Sodium: 138 mmol/L (ref 135–145)
Total Bilirubin: 0.4 mg/dL (ref 0.3–1.2)
Total Protein: 6.5 g/dL (ref 6.5–8.1)

## 2021-01-26 LAB — POCT HEMOGLOBIN-HEMACUE: Hemoglobin: 9.9 g/dL — ABNORMAL LOW (ref 12.0–15.0)

## 2021-01-26 LAB — PHOSPHORUS: Phosphorus: 7.6 mg/dL — ABNORMAL HIGH (ref 2.5–4.6)

## 2021-01-26 MED ORDER — EPOETIN ALFA-EPBX 10000 UNIT/ML IJ SOLN
INTRAMUSCULAR | Status: AC
  Filled 2021-01-26: qty 2

## 2021-01-26 MED ORDER — SODIUM CHLORIDE 0.9 % IV SOLN
510.0000 mg | Freq: Once | INTRAVENOUS | Status: AC
  Administered 2021-01-26: 510 mg via INTRAVENOUS
  Filled 2021-01-26: qty 510

## 2021-01-26 MED ORDER — EPOETIN ALFA-EPBX 10000 UNIT/ML IJ SOLN
20000.0000 [IU] | INTRAMUSCULAR | Status: DC
  Administered 2021-01-26: 20000 [IU] via SUBCUTANEOUS

## 2021-02-05 DIAGNOSIS — I120 Hypertensive chronic kidney disease with stage 5 chronic kidney disease or end stage renal disease: Secondary | ICD-10-CM | POA: Diagnosis not present

## 2021-02-05 DIAGNOSIS — N2581 Secondary hyperparathyroidism of renal origin: Secondary | ICD-10-CM | POA: Diagnosis not present

## 2021-02-05 DIAGNOSIS — D631 Anemia in chronic kidney disease: Secondary | ICD-10-CM | POA: Diagnosis not present

## 2021-02-05 DIAGNOSIS — N185 Chronic kidney disease, stage 5: Secondary | ICD-10-CM | POA: Diagnosis not present

## 2021-02-07 DIAGNOSIS — E1165 Type 2 diabetes mellitus with hyperglycemia: Secondary | ICD-10-CM | POA: Diagnosis not present

## 2021-02-07 DIAGNOSIS — E559 Vitamin D deficiency, unspecified: Secondary | ICD-10-CM | POA: Diagnosis not present

## 2021-02-07 DIAGNOSIS — N186 End stage renal disease: Secondary | ICD-10-CM | POA: Diagnosis not present

## 2021-02-07 DIAGNOSIS — Z23 Encounter for immunization: Secondary | ICD-10-CM | POA: Diagnosis not present

## 2021-02-07 DIAGNOSIS — M810 Age-related osteoporosis without current pathological fracture: Secondary | ICD-10-CM | POA: Diagnosis not present

## 2021-02-08 IMAGING — MR MRI ABDOMEN (MRCP)
7 of 8 series · 34 of 48 positions shown · non-contrast
Comparison: 03/26/2017

CLINICAL DATA: Intraductal papillary mucinous neoplasm. Follow-up.
Status post Whipple procedure in 5643.

EXAM:
MRI ABDOMEN WITHOUT CONTRAST  (INCLUDING MRCP)
TECHNIQUE: Multiplanar multisequence MR imaging of the abdomen was performed.
Heavily T2-weighted images of the biliary and pancreatic ducts were
obtained, and three-dimensional MRCP images were rendered by post
processing.

[Series 3: cor haste · coronal · 5.0mm · 0.70mm/px · 2 of 32 slices shown]
[im 1/32]
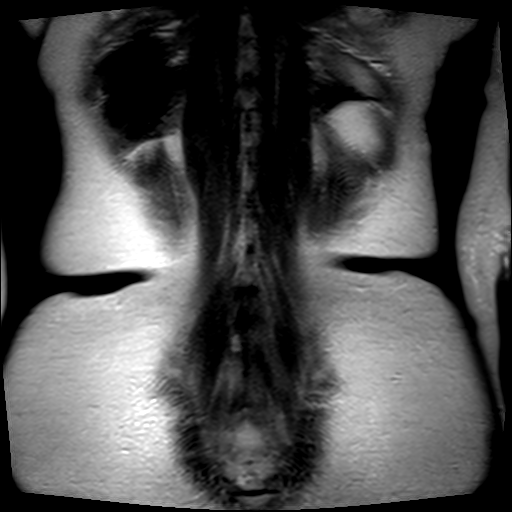
[im 32/32]
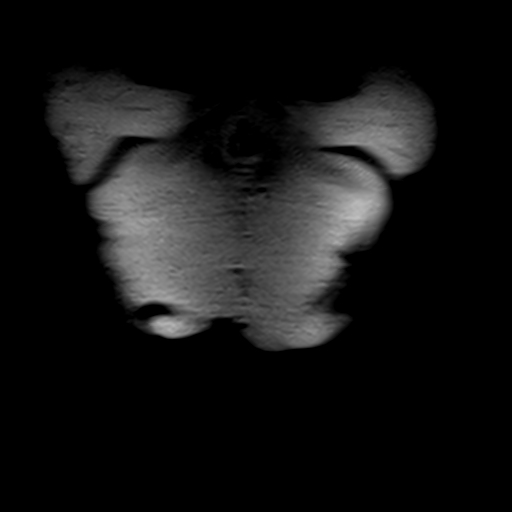

[Series 4: axial haste · axial · 6.0mm · 0.70mm/px · z∈[-71,+140]mm · 4 of 33 slices shown]
[im 1/33]
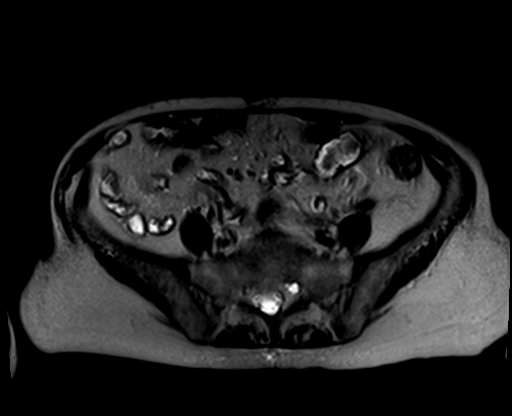
[im 11/33]
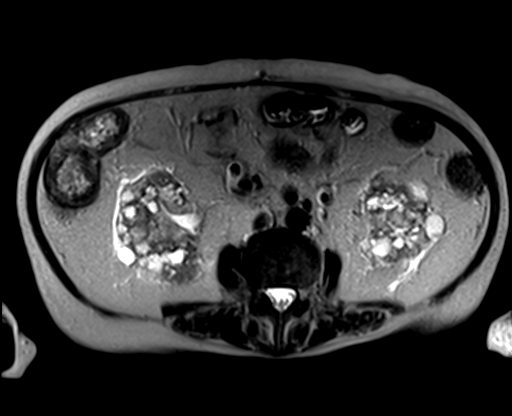
[im 22/33]
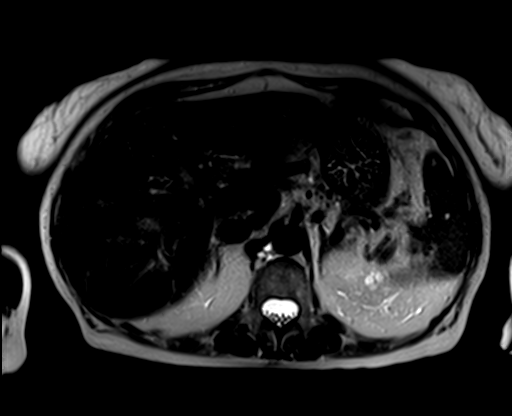
[im 33/33]
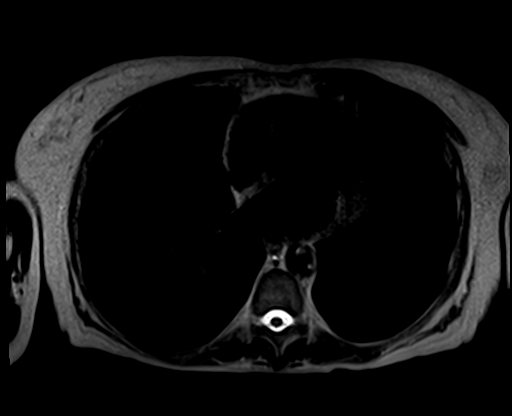

[Series 5: T1 · axial · 6.0mm · 0.70mm/px · z∈[-71,+140]mm · 7 of 66 slices shown]
[im 1/66]
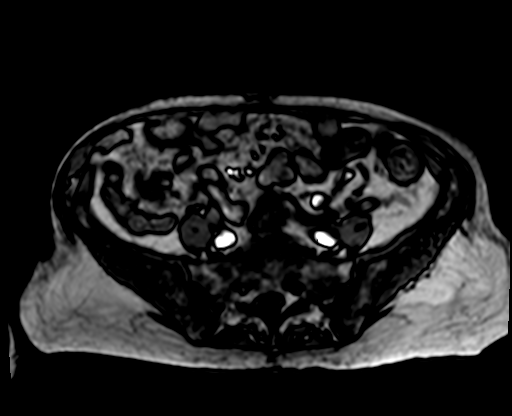
[im 11/66]
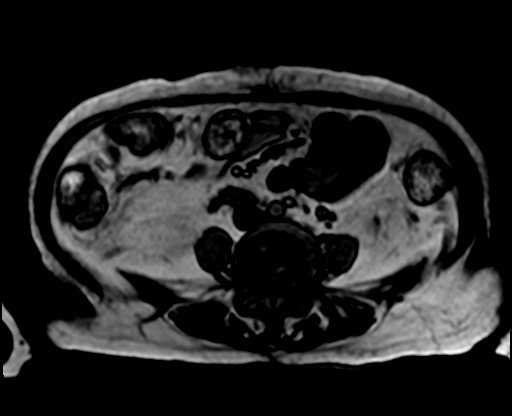
[im 22/66]
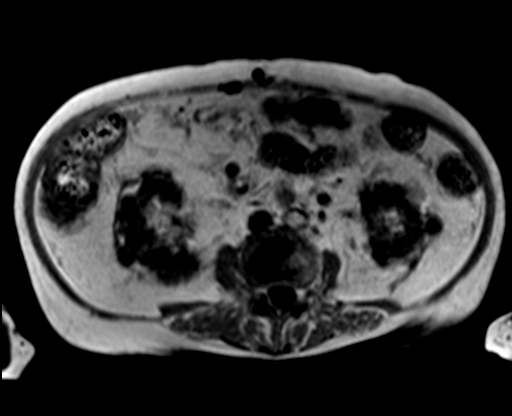
[im 33/66]
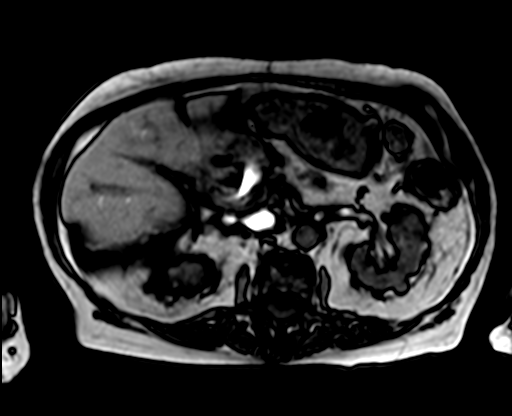
[im 44/66]
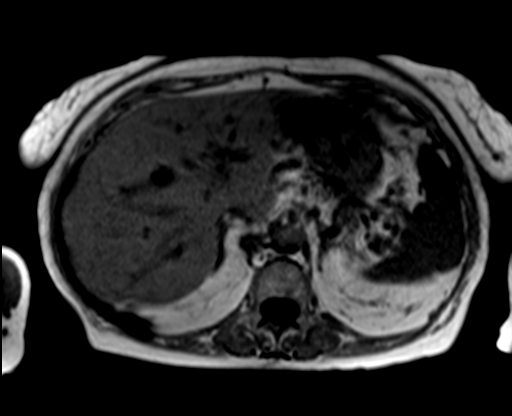
[im 55/66]
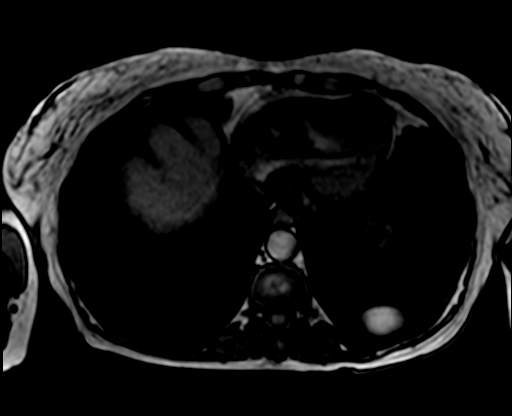
[im 66/66]
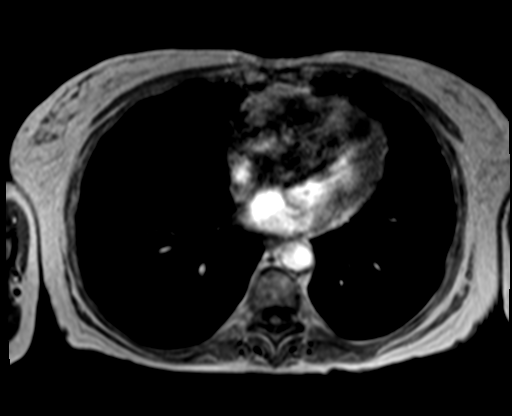

[Series 6: T2 · axial · 6.0mm · 1.12mm/px · z∈[-42,+169]mm · 4 of 33 slices shown]
[im 1/33]
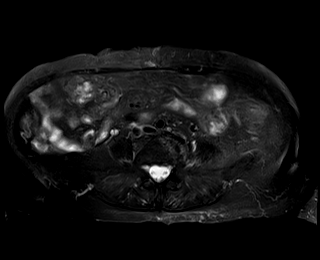
[im 11/33]
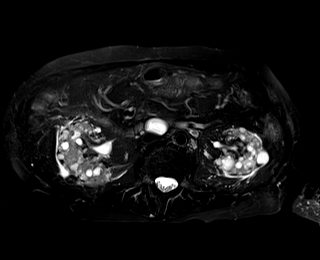
[im 22/33]
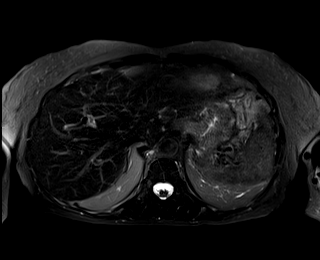
[im 33/33]
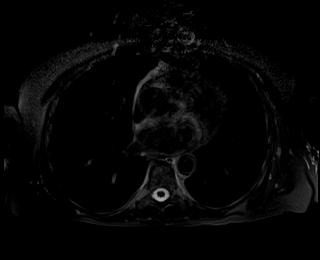

[Series 7: ep2d_diff_b50_500_800_p2_trig · axial · 6.0mm · 1.88mm/px · z∈[-42,+169]mm · 8 of 99 slices shown]
[im 1/99]
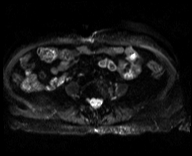
[im 20/99]
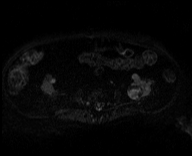
[im 30/99]
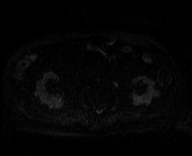
[im 40/99]
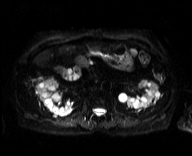
[im 59/99]
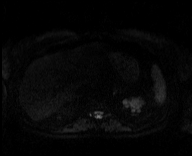
[im 69/99]
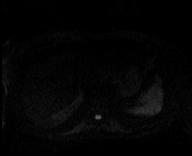
[im 79/99]
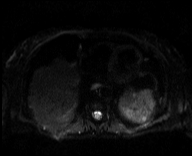
[im 99/99]
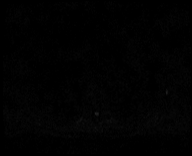

[Series 8: ep2d_diff_b50_500_800_p2_trig_adc · axial · 6.0mm · 1.88mm/px · z∈[-42,+169]mm · 4 of 33 slices shown]
[im 1/33]
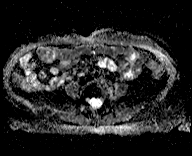
[im 11/33]
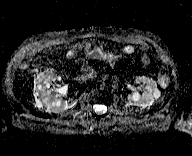
[im 22/33]
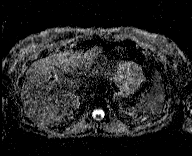
[im 33/33]
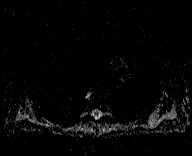

[Series 13: T1 dynamic · axial · non-contrast · 2.5mm · 0.70mm/px · z∈[-84,+46]mm · 5 of 96 slices shown]
[im 1/96]
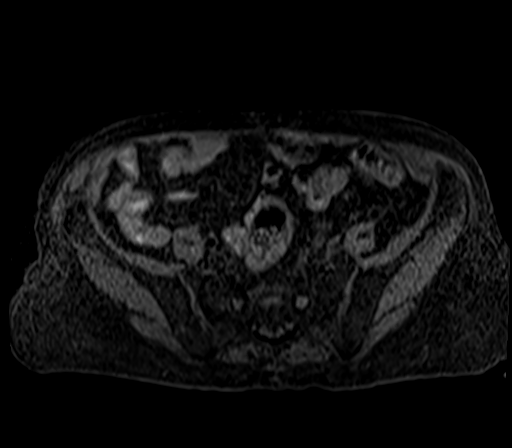
[im 11/96]
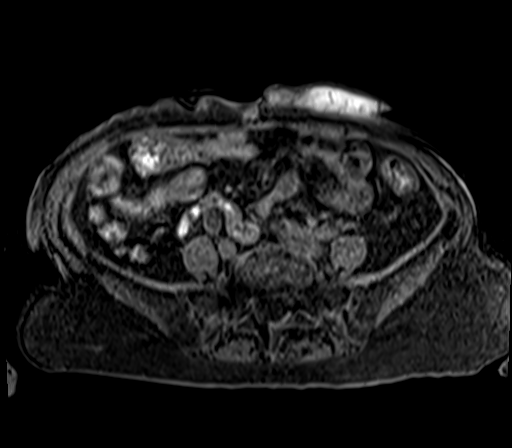
[im 32/96]
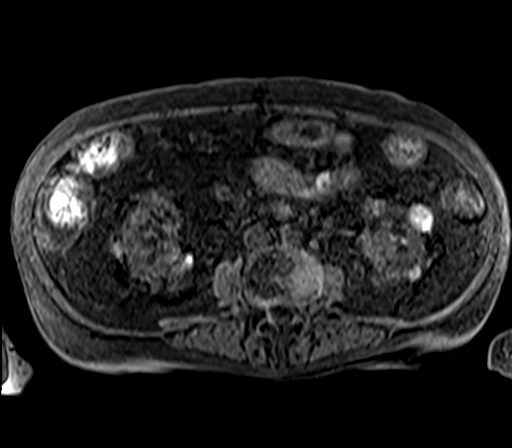
[im 43/96]
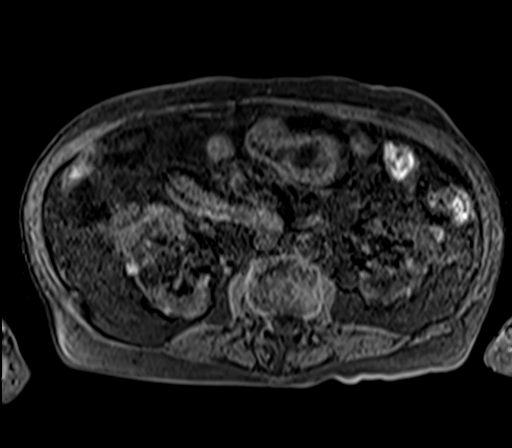
[im 53/96]
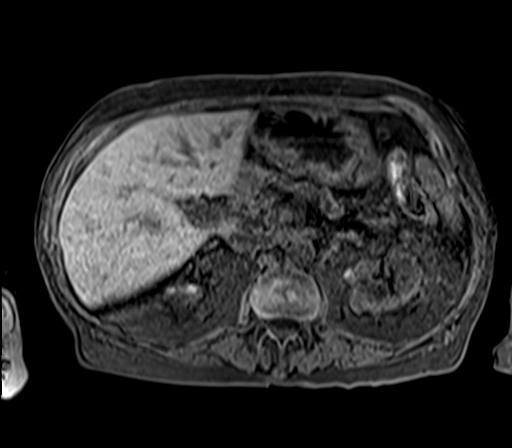

[34 of 48 positions shown; findings below may reference images not displayed]

FINDINGS: Lower chest: No acute findings.

Hepatobiliary: Scattered liver cysts. No suspicious liver lesion
identified. Cholecystectomy.

Pancreas: Status post Whipple procedure. Stable appearance of the
pancreatic body and tail. Residual atrophy and borderline ductal
dilatation is identified. The main duct measures 2 mm in diameter.
Within the neck there is a focal area of main and or side branch
duct ectasia measuring 5 mm, image [DATE]. Unchanged from previous
exam. No suspicious mass identified.

Spleen:  Small splenic cysts are unchanged.

Adrenals/Urinary Tract: The adrenal glands are unremarkable.
Innumerable cystic lesions of varying complexity are identified
throughout both lobes of liver. Some of these exhibit T1
hyperintensity. The previously referenced dominant complex lower
pole left renal lesion measures 2.8 cm, image [DATE]. Previously
cm.

Stomach/Bowel: Normal postop appearance of the stomach, small and
large bowel loops.

Vascular/Lymphatic: Normal caliber of the abdominal aorta. No
adenopathy identified.

Other:  No free fluid or fluid collections.

Musculoskeletal: No suspicious bone lesions identified.
IMPRESSION: 1. Stable exam. Status post Whipple procedure. Unchanged appearance
of focal main or side branch duct ectasia in the region of the
pancreatic neck at the level of the anastomosis.
2. No specific findings identified to suggest metastatic disease to
the abdomen.
3. Multiple indeterminate kidney lesions, incompletely characterized
without IV contrast.

## 2021-02-20 DIAGNOSIS — N185 Chronic kidney disease, stage 5: Secondary | ICD-10-CM | POA: Diagnosis not present

## 2021-02-20 DIAGNOSIS — N2581 Secondary hyperparathyroidism of renal origin: Secondary | ICD-10-CM | POA: Diagnosis not present

## 2021-02-20 DIAGNOSIS — D631 Anemia in chronic kidney disease: Secondary | ICD-10-CM | POA: Diagnosis not present

## 2021-02-20 DIAGNOSIS — I120 Hypertensive chronic kidney disease with stage 5 chronic kidney disease or end stage renal disease: Secondary | ICD-10-CM | POA: Diagnosis not present

## 2021-02-23 ENCOUNTER — Ambulatory Visit (HOSPITAL_COMMUNITY)
Admission: RE | Admit: 2021-02-23 | Discharge: 2021-02-23 | Disposition: A | Discharge status: Home / Self Care | Payer: Medicare PPO | Source: Ambulatory Visit | Attending: Nephrology | Admitting: Nephrology

## 2021-02-23 VITALS — BP 108/80 | HR 64 | Temp 96.8°F | Resp 19 | Wt 125.0 lb

## 2021-02-23 DIAGNOSIS — D631 Anemia in chronic kidney disease: Secondary | ICD-10-CM | POA: Insufficient documentation

## 2021-02-23 DIAGNOSIS — N185 Chronic kidney disease, stage 5: Secondary | ICD-10-CM | POA: Diagnosis not present

## 2021-02-23 DIAGNOSIS — N179 Acute kidney failure, unspecified: Secondary | ICD-10-CM

## 2021-02-23 LAB — COMPREHENSIVE METABOLIC PANEL
ALT: 70 U/L — ABNORMAL HIGH (ref 0–44)
AST: 51 U/L — ABNORMAL HIGH (ref 15–41)
Albumin: 3.4 g/dL — ABNORMAL LOW (ref 3.5–5.0)
Alkaline Phosphatase: 45 U/L (ref 38–126)
Anion gap: 10 (ref 5–15)
BUN: 65 mg/dL — ABNORMAL HIGH (ref 8–23)
CO2: 21 mmol/L — ABNORMAL LOW (ref 22–32)
Calcium: 8.7 mg/dL — ABNORMAL LOW (ref 8.9–10.3)
Chloride: 107 mmol/L (ref 98–111)
Creatinine, Ser: 4.46 mg/dL — ABNORMAL HIGH (ref 0.44–1.00)
GFR, Estimated: 9 mL/min — ABNORMAL LOW (ref >60)
Glucose, Bld: 193 mg/dL — ABNORMAL HIGH (ref 70–99)
Potassium: 4.3 mmol/L (ref 3.5–5.1)
Sodium: 138 mmol/L (ref 135–145)
Total Bilirubin: 0.6 mg/dL (ref 0.3–1.2)
Total Protein: 6.5 g/dL (ref 6.5–8.1)

## 2021-02-23 LAB — IRON AND TIBC
Iron: 82 ug/dL (ref 28–170)
Saturation Ratios: 24 % (ref 10.4–31.8)
TIBC: 347 ug/dL (ref 250–450)
UIBC: 265 ug/dL

## 2021-02-23 LAB — CBC WITH DIFFERENTIAL/PLATELET
Abs Immature Granulocytes: 0.03 10*3/uL (ref 0.00–0.07)
Basophils Absolute: 0.1 10*3/uL (ref 0.0–0.1)
Basophils Relative: 1 %
Eosinophils Absolute: 0.3 10*3/uL (ref 0.0–0.5)
Eosinophils Relative: 5 %
HCT: 36.2 % (ref 36.0–46.0)
Hemoglobin: 11.2 g/dL — ABNORMAL LOW (ref 12.0–15.0)
Immature Granulocytes: 0 %
Lymphocytes Relative: 18 %
Lymphs Abs: 1.3 10*3/uL (ref 0.7–4.0)
MCH: 27.7 pg (ref 26.0–34.0)
MCHC: 30.9 g/dL (ref 30.0–36.0)
MCV: 89.6 fL (ref 80.0–100.0)
Monocytes Absolute: 0.4 10*3/uL (ref 0.1–1.0)
Monocytes Relative: 5 %
Neutro Abs: 5.1 10*3/uL (ref 1.7–7.7)
Neutrophils Relative %: 71 %
Platelets: 263 10*3/uL (ref 150–400)
RBC: 4.04 MIL/uL (ref 3.87–5.11)
RDW: 16.5 % — ABNORMAL HIGH (ref 11.5–15.5)
WBC: 7.1 10*3/uL (ref 4.0–10.5)
nRBC: 0 % (ref 0.0–0.2)

## 2021-02-23 LAB — PHOSPHORUS: Phosphorus: 6.8 mg/dL — ABNORMAL HIGH (ref 2.5–4.6)

## 2021-02-23 LAB — POCT HEMOGLOBIN-HEMACUE: Hemoglobin: 10.7 g/dL — ABNORMAL LOW (ref 12.0–15.0)

## 2021-02-23 LAB — FERRITIN: Ferritin: 185 ng/mL (ref 11–307)

## 2021-02-23 MED ORDER — EPOETIN ALFA-EPBX 10000 UNIT/ML IJ SOLN
INTRAMUSCULAR | Status: AC
  Filled 2021-02-23: qty 2

## 2021-02-23 MED ORDER — EPOETIN ALFA-EPBX 10000 UNIT/ML IJ SOLN
20000.0000 [IU] | INTRAMUSCULAR | Status: DC
  Administered 2021-02-23: 20000 [IU] via SUBCUTANEOUS

## 2021-02-24 LAB — PTH, INTACT AND CALCIUM
Calcium, Total (PTH): 7.6 mg/dL — ABNORMAL LOW (ref 8.7–10.3)
PTH: 242 pg/mL — ABNORMAL HIGH (ref 15–65)

## 2021-02-26 DIAGNOSIS — N2581 Secondary hyperparathyroidism of renal origin: Secondary | ICD-10-CM | POA: Diagnosis not present

## 2021-02-26 DIAGNOSIS — N186 End stage renal disease: Secondary | ICD-10-CM | POA: Diagnosis not present

## 2021-02-26 DIAGNOSIS — Z992 Dependence on renal dialysis: Secondary | ICD-10-CM | POA: Diagnosis not present

## 2021-02-28 DIAGNOSIS — Z992 Dependence on renal dialysis: Secondary | ICD-10-CM | POA: Diagnosis not present

## 2021-02-28 DIAGNOSIS — N2581 Secondary hyperparathyroidism of renal origin: Secondary | ICD-10-CM | POA: Diagnosis not present

## 2021-02-28 DIAGNOSIS — N186 End stage renal disease: Secondary | ICD-10-CM | POA: Diagnosis not present

## 2021-03-02 DIAGNOSIS — N2581 Secondary hyperparathyroidism of renal origin: Secondary | ICD-10-CM | POA: Diagnosis not present

## 2021-03-02 DIAGNOSIS — N186 End stage renal disease: Secondary | ICD-10-CM | POA: Diagnosis not present

## 2021-03-02 DIAGNOSIS — Z992 Dependence on renal dialysis: Secondary | ICD-10-CM | POA: Diagnosis not present

## 2021-03-05 DIAGNOSIS — Z992 Dependence on renal dialysis: Secondary | ICD-10-CM | POA: Diagnosis not present

## 2021-03-05 DIAGNOSIS — N186 End stage renal disease: Secondary | ICD-10-CM | POA: Diagnosis not present

## 2021-03-05 DIAGNOSIS — N2581 Secondary hyperparathyroidism of renal origin: Secondary | ICD-10-CM | POA: Diagnosis not present

## 2021-03-07 DIAGNOSIS — N2581 Secondary hyperparathyroidism of renal origin: Secondary | ICD-10-CM | POA: Diagnosis not present

## 2021-03-07 DIAGNOSIS — Z992 Dependence on renal dialysis: Secondary | ICD-10-CM | POA: Diagnosis not present

## 2021-03-07 DIAGNOSIS — N186 End stage renal disease: Secondary | ICD-10-CM | POA: Diagnosis not present

## 2021-03-08 DIAGNOSIS — R140 Abdominal distension (gaseous): Secondary | ICD-10-CM | POA: Diagnosis not present

## 2021-03-09 DIAGNOSIS — N2581 Secondary hyperparathyroidism of renal origin: Secondary | ICD-10-CM | POA: Diagnosis not present

## 2021-03-09 DIAGNOSIS — N186 End stage renal disease: Secondary | ICD-10-CM | POA: Diagnosis not present

## 2021-03-09 DIAGNOSIS — Z992 Dependence on renal dialysis: Secondary | ICD-10-CM | POA: Diagnosis not present

## 2021-03-12 DIAGNOSIS — R739 Hyperglycemia, unspecified: Secondary | ICD-10-CM | POA: Diagnosis not present

## 2021-03-12 DIAGNOSIS — Z992 Dependence on renal dialysis: Secondary | ICD-10-CM | POA: Diagnosis not present

## 2021-03-12 DIAGNOSIS — N186 End stage renal disease: Secondary | ICD-10-CM | POA: Diagnosis not present

## 2021-03-12 DIAGNOSIS — E1165 Type 2 diabetes mellitus with hyperglycemia: Secondary | ICD-10-CM | POA: Diagnosis not present

## 2021-03-12 DIAGNOSIS — N2581 Secondary hyperparathyroidism of renal origin: Secondary | ICD-10-CM | POA: Diagnosis not present

## 2021-03-14 DIAGNOSIS — N186 End stage renal disease: Secondary | ICD-10-CM | POA: Diagnosis not present

## 2021-03-14 DIAGNOSIS — N2581 Secondary hyperparathyroidism of renal origin: Secondary | ICD-10-CM | POA: Diagnosis not present

## 2021-03-14 DIAGNOSIS — Z992 Dependence on renal dialysis: Secondary | ICD-10-CM | POA: Diagnosis not present

## 2021-03-16 DIAGNOSIS — N2581 Secondary hyperparathyroidism of renal origin: Secondary | ICD-10-CM | POA: Diagnosis not present

## 2021-03-16 DIAGNOSIS — N186 End stage renal disease: Secondary | ICD-10-CM | POA: Diagnosis not present

## 2021-03-16 DIAGNOSIS — Z992 Dependence on renal dialysis: Secondary | ICD-10-CM | POA: Diagnosis not present

## 2021-03-19 DIAGNOSIS — N186 End stage renal disease: Secondary | ICD-10-CM | POA: Diagnosis not present

## 2021-03-19 DIAGNOSIS — Z992 Dependence on renal dialysis: Secondary | ICD-10-CM | POA: Diagnosis not present

## 2021-03-19 DIAGNOSIS — N2581 Secondary hyperparathyroidism of renal origin: Secondary | ICD-10-CM | POA: Diagnosis not present

## 2021-03-20 DIAGNOSIS — I158 Other secondary hypertension: Secondary | ICD-10-CM | POA: Diagnosis not present

## 2021-03-20 DIAGNOSIS — I10 Essential (primary) hypertension: Secondary | ICD-10-CM | POA: Diagnosis not present

## 2021-03-20 DIAGNOSIS — R7303 Prediabetes: Secondary | ICD-10-CM | POA: Diagnosis not present

## 2021-03-20 DIAGNOSIS — Z992 Dependence on renal dialysis: Secondary | ICD-10-CM | POA: Diagnosis not present

## 2021-03-20 DIAGNOSIS — N186 End stage renal disease: Secondary | ICD-10-CM | POA: Diagnosis not present

## 2021-03-21 DIAGNOSIS — N186 End stage renal disease: Secondary | ICD-10-CM | POA: Diagnosis not present

## 2021-03-21 DIAGNOSIS — Z992 Dependence on renal dialysis: Secondary | ICD-10-CM | POA: Diagnosis not present

## 2021-03-21 DIAGNOSIS — N2581 Secondary hyperparathyroidism of renal origin: Secondary | ICD-10-CM | POA: Diagnosis not present

## 2021-03-23 DIAGNOSIS — N2581 Secondary hyperparathyroidism of renal origin: Secondary | ICD-10-CM | POA: Diagnosis not present

## 2021-03-23 DIAGNOSIS — N186 End stage renal disease: Secondary | ICD-10-CM | POA: Diagnosis not present

## 2021-03-23 DIAGNOSIS — Z992 Dependence on renal dialysis: Secondary | ICD-10-CM | POA: Diagnosis not present

## 2021-03-26 DIAGNOSIS — N186 End stage renal disease: Secondary | ICD-10-CM | POA: Diagnosis not present

## 2021-03-26 DIAGNOSIS — N2581 Secondary hyperparathyroidism of renal origin: Secondary | ICD-10-CM | POA: Diagnosis not present

## 2021-03-26 DIAGNOSIS — Z992 Dependence on renal dialysis: Secondary | ICD-10-CM | POA: Diagnosis not present

## 2021-03-28 DIAGNOSIS — Z992 Dependence on renal dialysis: Secondary | ICD-10-CM | POA: Diagnosis not present

## 2021-03-28 DIAGNOSIS — N2581 Secondary hyperparathyroidism of renal origin: Secondary | ICD-10-CM | POA: Diagnosis not present

## 2021-03-28 DIAGNOSIS — N186 End stage renal disease: Secondary | ICD-10-CM | POA: Diagnosis not present

## 2021-03-30 DIAGNOSIS — N2581 Secondary hyperparathyroidism of renal origin: Secondary | ICD-10-CM | POA: Diagnosis not present

## 2021-03-30 DIAGNOSIS — Z992 Dependence on renal dialysis: Secondary | ICD-10-CM | POA: Diagnosis not present

## 2021-03-30 DIAGNOSIS — N186 End stage renal disease: Secondary | ICD-10-CM | POA: Diagnosis not present

## 2021-04-02 DIAGNOSIS — N2581 Secondary hyperparathyroidism of renal origin: Secondary | ICD-10-CM | POA: Diagnosis not present

## 2021-04-02 DIAGNOSIS — N186 End stage renal disease: Secondary | ICD-10-CM | POA: Diagnosis not present

## 2021-04-02 DIAGNOSIS — Z992 Dependence on renal dialysis: Secondary | ICD-10-CM | POA: Diagnosis not present

## 2021-04-04 DIAGNOSIS — N2581 Secondary hyperparathyroidism of renal origin: Secondary | ICD-10-CM | POA: Diagnosis not present

## 2021-04-04 DIAGNOSIS — N186 End stage renal disease: Secondary | ICD-10-CM | POA: Diagnosis not present

## 2021-04-04 DIAGNOSIS — Z992 Dependence on renal dialysis: Secondary | ICD-10-CM | POA: Diagnosis not present

## 2021-04-06 DIAGNOSIS — Z992 Dependence on renal dialysis: Secondary | ICD-10-CM | POA: Diagnosis not present

## 2021-04-06 DIAGNOSIS — N186 End stage renal disease: Secondary | ICD-10-CM | POA: Diagnosis not present

## 2021-04-06 DIAGNOSIS — N2581 Secondary hyperparathyroidism of renal origin: Secondary | ICD-10-CM | POA: Diagnosis not present

## 2021-04-09 DIAGNOSIS — Z992 Dependence on renal dialysis: Secondary | ICD-10-CM | POA: Diagnosis not present

## 2021-04-09 DIAGNOSIS — N186 End stage renal disease: Secondary | ICD-10-CM | POA: Diagnosis not present

## 2021-04-09 DIAGNOSIS — N2581 Secondary hyperparathyroidism of renal origin: Secondary | ICD-10-CM | POA: Diagnosis not present

## 2021-04-11 ENCOUNTER — Encounter (HOSPITAL_COMMUNITY): Payer: Self-pay

## 2021-04-11 DIAGNOSIS — N186 End stage renal disease: Secondary | ICD-10-CM | POA: Diagnosis not present

## 2021-04-11 DIAGNOSIS — N2581 Secondary hyperparathyroidism of renal origin: Secondary | ICD-10-CM | POA: Diagnosis not present

## 2021-04-11 DIAGNOSIS — Z992 Dependence on renal dialysis: Secondary | ICD-10-CM | POA: Diagnosis not present

## 2021-04-13 DIAGNOSIS — N2581 Secondary hyperparathyroidism of renal origin: Secondary | ICD-10-CM | POA: Diagnosis not present

## 2021-04-13 DIAGNOSIS — N186 End stage renal disease: Secondary | ICD-10-CM | POA: Diagnosis not present

## 2021-04-13 DIAGNOSIS — Z992 Dependence on renal dialysis: Secondary | ICD-10-CM | POA: Diagnosis not present

## 2021-04-17 DIAGNOSIS — N186 End stage renal disease: Secondary | ICD-10-CM | POA: Diagnosis not present

## 2021-04-17 DIAGNOSIS — N2581 Secondary hyperparathyroidism of renal origin: Secondary | ICD-10-CM | POA: Diagnosis not present

## 2021-04-17 DIAGNOSIS — Z992 Dependence on renal dialysis: Secondary | ICD-10-CM | POA: Diagnosis not present

## 2021-04-18 DIAGNOSIS — N186 End stage renal disease: Secondary | ICD-10-CM | POA: Diagnosis not present

## 2021-04-18 DIAGNOSIS — N2581 Secondary hyperparathyroidism of renal origin: Secondary | ICD-10-CM | POA: Diagnosis not present

## 2021-04-18 DIAGNOSIS — Z992 Dependence on renal dialysis: Secondary | ICD-10-CM | POA: Diagnosis not present

## 2021-04-19 ENCOUNTER — Ambulatory Visit: Payer: Medicare PPO | Admitting: Podiatry

## 2021-04-19 ENCOUNTER — Ambulatory Visit (INDEPENDENT_AMBULATORY_CARE_PROVIDER_SITE_OTHER): Payer: Medicare PPO

## 2021-04-19 ENCOUNTER — Other Ambulatory Visit: Payer: Self-pay

## 2021-04-19 DIAGNOSIS — M79672 Pain in left foot: Secondary | ICD-10-CM | POA: Diagnosis not present

## 2021-04-19 DIAGNOSIS — M79671 Pain in right foot: Secondary | ICD-10-CM

## 2021-04-20 DIAGNOSIS — N2581 Secondary hyperparathyroidism of renal origin: Secondary | ICD-10-CM | POA: Diagnosis not present

## 2021-04-20 DIAGNOSIS — Z992 Dependence on renal dialysis: Secondary | ICD-10-CM | POA: Diagnosis not present

## 2021-04-20 DIAGNOSIS — N186 End stage renal disease: Secondary | ICD-10-CM | POA: Diagnosis not present

## 2021-04-23 DIAGNOSIS — N2581 Secondary hyperparathyroidism of renal origin: Secondary | ICD-10-CM | POA: Diagnosis not present

## 2021-04-23 DIAGNOSIS — Z992 Dependence on renal dialysis: Secondary | ICD-10-CM | POA: Diagnosis not present

## 2021-04-23 DIAGNOSIS — N186 End stage renal disease: Secondary | ICD-10-CM | POA: Diagnosis not present

## 2021-04-23 NOTE — Progress Notes (Signed)
Subjective:  ? ?Patient ID: Kim Burns, female   DOB: 86 y.o.   MRN: [id number]  ? ?HPI ?86 year old female presents the office today with concerns of pain to her big toe, left fifth toe and the left foot at the start about 4 weeks ago and suddenly the pain went away last week and she currently has no pain.  She does have a history of gout.  She denies any recent injury.  She has no pain currently. ? ?Last A1c was 6.1 last glucose was 116 ? ? ?Review of Systems  ?All other systems reviewed and are negative. ? ?Past Medical History:  ?Diagnosis Date  ? Anemia of chronic disease 1985  ? hysterectomy fibroid tumors  ? Anxiety   ? Bleeding ulcer   ? Blood transfusion without reported diagnosis   ? Bradycardia   ? CKD (chronic kidney disease) stage 3, GFR 30-59 ml/min (HCC)   ? sees dr Arty Baumgartner sees next in jan 2018  ? Diabetes mellitus without complication ([place])   ? became diabetic after Whipple procedure  ? Diarrhea   ? Dysrhythmia 04/16/2016  ? bradycardia due to medication   ? GERD (gastroesophageal reflux disease)   ? Gout   ? Headache   ? History of kidney stones 06/2013  ? Hyperparathyroidism ([place])   ? Hypertension   ? Itching   ? last year  ? PONV (postoperative nausea and vomiting)   ? Sleep apnea   ? no cpap machine. could not tolerate  ? Stroke ([place]) 04/16/2016  ? TIA 1995  ? TIA (transient ischemic attack) 1996  ? x 1  ? Vitamin D deficiency   ? ? ?Past Surgical History:  ?Procedure Laterality Date  ? ABDOMINAL HYSTERECTOMY    ? complete  ? APPENDECTOMY    ? with gallbladder  ? BACK SURGERY    ? lower  ? BASCILIC VEIN TRANSPOSITION Left 04/24/2017  ? Procedure: LEFT ARM FIRST STAGE BASILIC VEIN TRANSPOSITION;  Surgeon: Serafina Mitchell, MD;  Location: [place];  Service: Vascular;  Laterality: Left;  ? BASCILIC VEIN TRANSPOSITION Left 07/10/2017  ? Procedure: SECOND STAGE BASILIC VEIN TRANSPOSITION LEFT ARM;  Surgeon: Serafina Mitchell, MD;  [facility];  Service: Vascular;  Laterality: Left;  ? BREAST  LUMPECTOMY Left x 2  ? many years apart, benign  ? CHOLECYSTECTOMY    ? CYSTOSCOPY WITH URETEROSCOPY AND STENT PLACEMENT Left 06/18/2013  ? Procedure: CYSTOSCOPY WITH Lef URETEROSCOPY AND Left STENT PLACEMENT;  Surgeon: Dutch Gray, MD;  Location: [facility];  Service: Urology;  Laterality: Left;  ? ESOPHAGOGASTRODUODENOSCOPY (EGD) WITH PROPOFOL N/A 11/13/2016  ? Procedure: ESOPHAGOGASTRODUODENOSCOPY (EGD) WITH PROPOFOL;  Surgeon: Otis Brace, MD;  [facility];  Service: Gastroenterology;  Laterality: N/A;  ? EUS N/A 02/07/2016  ? Procedure: ESOPHAGEAL ENDOSCOPIC ULTRASOUND (EUS) RADIAL;  Surgeon: Arta Silence, MD;  [facility]: [facility];  Service: Endoscopy;  Laterality: N/A;  ? EYE SURGERY Bilateral 2014  ? ioc for catracts   ? FOOT SURGERY    ? something with toes unsure what   ? HERNIA REPAIR    ? PARATHYROID EXPLORATION    ? WHIPPLE PROCEDURE N/A 04/23/2016  ? Procedure: WHIPPLE PROCEDURE;  Surgeon: Stark Klein, MD;  Location: [place];  Service: General;  Laterality: N/A;  ? ? ? ?Current Outpatient Medications:  ?  diclofenac sodium (VOLTAREN) 1 % GEL, Apply 2 g topically 4 (four) times daily as needed (for hand pain). , Disp: , Rfl:  ?  escitalopram (LEXAPRO) 5 MG tablet, Take 5 mg by mouth 3 (three) times daily. , Disp: , Rfl:  ?  fenofibrate (TRICOR) 145 MG tablet, Take 72.5 mg by mouth every morning. , Disp: , Rfl:  ?  furosemide (LASIX) 80 MG tablet, Take 80 mg by mouth daily., Disp: , Rfl:  ?  insulin lispro (HUMALOG) 100 UNIT/ML injection, Inject 2-4 Units into the skin 3 (three) times daily before meals. Sliding scale before meals three times a day  150-200 Give 2 units 200-250 give 3 units 250-300 give 4 units , Disp: , Rfl:  ?  iron polysaccharides (NIFEREX) 150 MG capsule, Take 150 mg by mouth 2 (two) times daily., Disp: , Rfl:  ?  lipase/protease/amylase (CREON) 12000-38000 units CPEP capsule, Take by mouth., Disp: , Rfl:  ?  metoprolol succinate (TOPROL-XL) 25 MG 24 hr tablet, Take 25 mg  by mouth daily., Disp: , Rfl: 4 ?  Multiple Vitamins-Minerals (EYE VITAMINS & MINERALS PO), Take 1 tablet by mouth daily., Disp: , Rfl:  ?  Pancrelipase, Lip-Prot-Amyl, (ZENPEP PO), Take by mouth. 2 with meals and 1 with snacks, Disp: , Rfl:  ?  pantoprazole (PROTONIX) 40 MG tablet, Take 1 tablet (40 mg total) by mouth 2 (two) times daily before a meal. (Patient taking differently: Take 40 mg by mouth daily. ), Disp: 60 tablet, Rfl: 0 ?  pantoprazole (PROTONIX) 40 MG tablet, Take 40 mg by mouth every morning. , Disp: , Rfl:  ?  Propylene Glycol 0.6 % SOLN, Place 1 drop into both eyes 4 (four) times daily as needed (for dry eyes)., Disp: , Rfl:  ?  sevelamer carbonate (RENVELA) 800 MG tablet, Take 800 mg by mouth 3 (three) times daily with meals. 2 tabs daily with meals, Disp: , Rfl:  ?  sodium bicarbonate 650 MG tablet, Take 2 tablets (1,300 mg total) by mouth 2 (two) times daily., Disp: 60 tablet, Rfl: 0 ?  telmisartan-hydrochlorothiazide (MICARDIS HCT) 80-25 MG tablet, Take 1 tablet by mouth daily., Disp: , Rfl: 0 ?  Vitamin D, Ergocalciferol, (DRISDOL) 1.25 MG (50000 UT) CAPS capsule, Take 50,000 Units by mouth daily., Disp: , Rfl:  ? ?Allergies  ?Allergen Reactions  ? Gemfibrozil Other (See Comments)  ?  Myalgia   ? Lotemax [Loteprednol Etabonate] Other (See Comments)  ?  burning  ? Statins Other (See Comments)  ?  Muscle weakness  ? Doxycycline Other (See Comments)  ?  UNSPECIFIED REACTION   ? Codeine Other (See Comments)  ?  headache  ? Hydrocodone-Acetaminophen Other (See Comments)  ?  Headache. Tolerates acetaminophen.  ? ? ? ? ? ?   ?Objective:  ?Physical Exam  ?General: AAO x3, NAD ? ?Dermatological: Skin is warm, dry and supple bilateral.  There are no open sores, no preulcerative lesions, no rash or signs of infection present. ? ?Vascular: Dorsalis Pedis artery and Posterior Tibial artery pedal pulses are 2/4 bilateral with immedate capillary fill time.  There is no pain with calf compression, swelling,  warmth, erythema.  ? ?Neruologic: Sensation intact with Semmes Weinstein monofilament. ? ?Musculoskeletal: Unable to elicit any area of pinpoint tenderness.  There is no edema, erythema or signs of infection, gout currently.  Occasionally she will get some discomfort upon palpation submetatarsal 2 but no pain currently identified.  Muscular strength 5/5 in all groups tested bilateral. ? ?Gait: Unassisted, Nonantalgic.  ? ? ?   ?Assessment:  ? ?Resolved left foot pain ? ?   ?Plan:  ?X-rays obtained reviewed.  No evidence of acute fracture or foreign body noted today.  Discussed with her the etiology of symptoms it could be gout but unsure at this point as her symptoms have resolved.  If there were to be any reoccurrence to let me know. ? ?Trula Slade DPM ? ?   ? ?

## 2021-04-25 DIAGNOSIS — N186 End stage renal disease: Secondary | ICD-10-CM | POA: Diagnosis not present

## 2021-04-25 DIAGNOSIS — Z992 Dependence on renal dialysis: Secondary | ICD-10-CM | POA: Diagnosis not present

## 2021-04-25 DIAGNOSIS — N2581 Secondary hyperparathyroidism of renal origin: Secondary | ICD-10-CM | POA: Diagnosis not present

## 2021-04-27 DIAGNOSIS — Z992 Dependence on renal dialysis: Secondary | ICD-10-CM | POA: Diagnosis not present

## 2021-04-27 DIAGNOSIS — N186 End stage renal disease: Secondary | ICD-10-CM | POA: Diagnosis not present

## 2021-04-27 DIAGNOSIS — N2581 Secondary hyperparathyroidism of renal origin: Secondary | ICD-10-CM | POA: Diagnosis not present

## 2021-04-30 DIAGNOSIS — Z992 Dependence on renal dialysis: Secondary | ICD-10-CM | POA: Diagnosis not present

## 2021-04-30 DIAGNOSIS — N2581 Secondary hyperparathyroidism of renal origin: Secondary | ICD-10-CM | POA: Diagnosis not present

## 2021-04-30 DIAGNOSIS — N186 End stage renal disease: Secondary | ICD-10-CM | POA: Diagnosis not present

## 2021-05-02 DIAGNOSIS — Z992 Dependence on renal dialysis: Secondary | ICD-10-CM | POA: Diagnosis not present

## 2021-05-02 DIAGNOSIS — N2581 Secondary hyperparathyroidism of renal origin: Secondary | ICD-10-CM | POA: Diagnosis not present

## 2021-05-02 DIAGNOSIS — N186 End stage renal disease: Secondary | ICD-10-CM | POA: Diagnosis not present

## 2021-05-03 DIAGNOSIS — D49511 Neoplasm of unspecified behavior of right kidney: Secondary | ICD-10-CM | POA: Diagnosis not present

## 2021-05-03 DIAGNOSIS — N2889 Other specified disorders of kidney and ureter: Secondary | ICD-10-CM | POA: Diagnosis not present

## 2021-05-03 DIAGNOSIS — N261 Atrophy of kidney (terminal): Secondary | ICD-10-CM | POA: Diagnosis not present

## 2021-05-04 DIAGNOSIS — N186 End stage renal disease: Secondary | ICD-10-CM | POA: Diagnosis not present

## 2021-05-04 DIAGNOSIS — Z992 Dependence on renal dialysis: Secondary | ICD-10-CM | POA: Diagnosis not present

## 2021-05-04 DIAGNOSIS — N2581 Secondary hyperparathyroidism of renal origin: Secondary | ICD-10-CM | POA: Diagnosis not present

## 2021-05-07 DIAGNOSIS — N2581 Secondary hyperparathyroidism of renal origin: Secondary | ICD-10-CM | POA: Diagnosis not present

## 2021-05-07 DIAGNOSIS — N186 End stage renal disease: Secondary | ICD-10-CM | POA: Diagnosis not present

## 2021-05-07 DIAGNOSIS — D49512 Neoplasm of unspecified behavior of left kidney: Secondary | ICD-10-CM | POA: Diagnosis not present

## 2021-05-07 DIAGNOSIS — Z992 Dependence on renal dialysis: Secondary | ICD-10-CM | POA: Diagnosis not present

## 2021-05-09 DIAGNOSIS — N186 End stage renal disease: Secondary | ICD-10-CM | POA: Diagnosis not present

## 2021-05-09 DIAGNOSIS — N2581 Secondary hyperparathyroidism of renal origin: Secondary | ICD-10-CM | POA: Diagnosis not present

## 2021-05-09 DIAGNOSIS — Z992 Dependence on renal dialysis: Secondary | ICD-10-CM | POA: Diagnosis not present

## 2021-05-11 DIAGNOSIS — Z992 Dependence on renal dialysis: Secondary | ICD-10-CM | POA: Diagnosis not present

## 2021-05-11 DIAGNOSIS — N186 End stage renal disease: Secondary | ICD-10-CM | POA: Diagnosis not present

## 2021-05-11 DIAGNOSIS — N2581 Secondary hyperparathyroidism of renal origin: Secondary | ICD-10-CM | POA: Diagnosis not present

## 2021-05-14 DIAGNOSIS — Z992 Dependence on renal dialysis: Secondary | ICD-10-CM | POA: Diagnosis not present

## 2021-05-14 DIAGNOSIS — N186 End stage renal disease: Secondary | ICD-10-CM | POA: Diagnosis not present

## 2021-05-14 DIAGNOSIS — N2581 Secondary hyperparathyroidism of renal origin: Secondary | ICD-10-CM | POA: Diagnosis not present

## 2021-05-15 DIAGNOSIS — E119 Type 2 diabetes mellitus without complications: Secondary | ICD-10-CM | POA: Diagnosis not present

## 2021-05-15 DIAGNOSIS — H353131 Nonexudative age-related macular degeneration, bilateral, early dry stage: Secondary | ICD-10-CM | POA: Diagnosis not present

## 2021-05-16 DIAGNOSIS — N2581 Secondary hyperparathyroidism of renal origin: Secondary | ICD-10-CM | POA: Diagnosis not present

## 2021-05-16 DIAGNOSIS — Z992 Dependence on renal dialysis: Secondary | ICD-10-CM | POA: Diagnosis not present

## 2021-05-16 DIAGNOSIS — N186 End stage renal disease: Secondary | ICD-10-CM | POA: Diagnosis not present

## 2021-05-17 ENCOUNTER — Other Ambulatory Visit: Payer: Self-pay | Admitting: Urology

## 2021-05-17 DIAGNOSIS — N2889 Other specified disorders of kidney and ureter: Secondary | ICD-10-CM

## 2021-05-18 ENCOUNTER — Ambulatory Visit
Admission: RE | Admit: 2021-05-18 | Discharge: 2021-05-18 | Disposition: A | Discharge status: Home / Self Care | Payer: Medicare PPO | Source: Ambulatory Visit | Attending: Urology | Admitting: Urology

## 2021-05-18 DIAGNOSIS — N2889 Other specified disorders of kidney and ureter: Secondary | ICD-10-CM | POA: Diagnosis not present

## 2021-05-18 DIAGNOSIS — N2581 Secondary hyperparathyroidism of renal origin: Secondary | ICD-10-CM | POA: Diagnosis not present

## 2021-05-18 DIAGNOSIS — I158 Other secondary hypertension: Secondary | ICD-10-CM | POA: Diagnosis not present

## 2021-05-18 DIAGNOSIS — Z992 Dependence on renal dialysis: Secondary | ICD-10-CM | POA: Diagnosis not present

## 2021-05-18 DIAGNOSIS — N186 End stage renal disease: Secondary | ICD-10-CM | POA: Diagnosis not present

## 2021-05-18 HISTORY — PX: IR RADIOLOGIST EVAL & MGMT: IMG5224

## 2021-05-18 NOTE — Consult Note (Signed)
? ? ?Chief Complaint: ?Left sided renal mass ? ?Referring Physician(s): ?Herrick,Benjamin W ? ?History of Present Illness: ?Kim Burns is a 86 y.o. female with past medical history significant for stroke, diabetes (post Whipple for IPMN with high-grade dysplasia), bradycardia, anemia of chronic disease and chronic renal insufficiency, now progressed to end-stage renal disease, initiated on dialysis in January of this year who has been referred to the interventional radiology clinic for evaluation for percutaneous management of left-sided renal mass. ? ?Patient is expectedly fatigued since initiation of hemodialysis earlier this year.  She is otherwise in her baseline state of health.  She remains asymptomatic in regards to this incidentally discovered left-sided renal mass.  Specifically, no flank pain or unintentional weight loss. ? ?She lives alone and is independent with all ADLs though has many involved family members. ? ? ?Past Medical History:  ?Diagnosis Date  ? Anemia of chronic disease 1985  ? hysterectomy fibroid tumors  ? Anxiety   ? Bleeding ulcer   ? Blood transfusion without reported diagnosis   ? Bradycardia   ? CKD (chronic kidney disease) stage 3, GFR 30-59 ml/min (HCC)   ? sees dr Arty Baumgartner sees next in jan 2018  ? Diabetes mellitus without complication ([place])   ? became diabetic after Whipple procedure  ? Diarrhea   ? Dysrhythmia 04/16/2016  ? bradycardia due to medication   ? GERD (gastroesophageal reflux disease)   ? Gout   ? Headache   ? History of kidney stones 06/2013  ? Hyperparathyroidism ([place])   ? Hypertension   ? Itching   ? last year  ? PONV (postoperative nausea and vomiting)   ? Sleep apnea   ? no cpap machine. could not tolerate  ? Stroke ([place]) 04/16/2016  ? TIA 1995  ? TIA (transient ischemic attack) 1996  ? x 1  ? Vitamin D deficiency   ? ? ?Past Surgical History:  ?Procedure Laterality Date  ? ABDOMINAL HYSTERECTOMY    ? complete  ? APPENDECTOMY    ? with gallbladder  ?  BACK SURGERY    ? lower  ? BASCILIC VEIN TRANSPOSITION Left 04/24/2017  ? Procedure: LEFT ARM FIRST STAGE BASILIC VEIN TRANSPOSITION;  Surgeon: Serafina Mitchell, MD;  Location: [place];  Service: Vascular;  Laterality: Left;  ? BASCILIC VEIN TRANSPOSITION Left 07/10/2017  ? Procedure: SECOND STAGE BASILIC VEIN TRANSPOSITION LEFT ARM;  Surgeon: Serafina Mitchell, MD;  [facility];  Service: Vascular;  Laterality: Left;  ? BREAST LUMPECTOMY Left x 2  ? many years apart, benign  ? CHOLECYSTECTOMY    ? CYSTOSCOPY WITH URETEROSCOPY AND STENT PLACEMENT Left 06/18/2013  ? Procedure: CYSTOSCOPY WITH Lef URETEROSCOPY AND Left STENT PLACEMENT;  Surgeon: Dutch Gray, MD;  [facility];  Service: Urology;  Laterality: Left;  ? ESOPHAGOGASTRODUODENOSCOPY (EGD) WITH PROPOFOL N/A 11/13/2016  ? Procedure: ESOPHAGOGASTRODUODENOSCOPY (EGD) WITH PROPOFOL;  Surgeon: Otis Brace, MD;  [facility];  Service: Gastroenterology;  Laterality: N/A;  ? EUS N/A 02/07/2016  ? Procedure: ESOPHAGEAL ENDOSCOPIC ULTRASOUND (EUS) RADIAL;  Surgeon: Arta Silence, MD;  [facility];  Service: Endoscopy;  Laterality: N/A;  ? EYE SURGERY Bilateral 2014  ? ioc for catracts   ? FOOT SURGERY    ? something with toes unsure what   ? HERNIA REPAIR    ? PARATHYROID EXPLORATION    ? WHIPPLE PROCEDURE N/A 04/23/2016  ? Procedure: WHIPPLE PROCEDURE;  Surgeon: Stark Klein, MD;  Location: [place];  Service: General;  Laterality: N/A;  ? ? ?Allergies: ?Gemfibrozil, Lotemax [loteprednol etabonate], Statins, Doxycycline, Codeine, and Hydrocodone-acetaminophen ? ?Medications: ?Prior to Admission medications   ?Medication Sig Start Date End Date Taking? Authorizing Provider  ?diclofenac sodium (VOLTAREN) 1 % GEL Apply 2 g topically 4 (four) times daily as needed (for hand pain).     [provider]  ?escitalopram (LEXAPRO) 5 MG tablet Take 5 mg by mouth 3 (three) times daily.     [provider]  ?fenofibrate (TRICOR) 145 MG  tablet Take 72.5 mg by mouth every morning.     [provider]  ?furosemide (LASIX) 80 MG tablet Take 80 mg by mouth daily.    [provider]  ?insulin lispro (HUMALOG) 100 UNIT/ML injection Inject 2-4 Units into the skin 3 (three) times daily before meals. Sliding scale before meals three times a day  ?150-200 Give 2 units ?200-250 give 3 units ?250-300 give 4 units     [provider]  ?iron polysaccharides (NIFEREX) 150 MG capsule Take 150 mg by mouth 2 (two) times daily.    [provider]  ?lipase/protease/amylase (CREON) 12000-38000 units CPEP capsule Take by mouth.    [provider]  ?metoprolol succinate (TOPROL-XL) 25 MG 24 hr tablet Take 25 mg by mouth daily. 12/17/16   [provider]  ?Multiple Vitamins-Minerals (EYE VITAMINS & MINERALS PO) Take 1 tablet by mouth daily.    [provider]  ?Pancrelipase, Lip-Prot-Amyl, (ZENPEP PO) Take by mouth. 2 with meals and 1 with snacks    [provider]  ?pantoprazole (PROTONIX) 40 MG tablet Take 1 tablet (40 mg total) by mouth 2 (two) times daily before a meal. ?Patient taking differently: Take 40 mg by mouth daily.  11/14/16 07/02/17  Aline August, MD  ?pantoprazole (PROTONIX) 40 MG tablet Take 40 mg by mouth every morning.  08/04/17   [provider]  ?Propylene Glycol 0.6 % SOLN Place 1 drop into both eyes 4 (four) times daily as needed (for dry eyes).    [provider]  ?sevelamer carbonate (RENVELA) 800 MG tablet Take 800 mg by mouth 3 (three) times daily with meals. 2 tabs daily with meals    [provider]  ?sodium bicarbonate 650 MG tablet Take 2 tablets (1,300 mg total) by mouth 2 (two) times daily. 10/07/16   Carlyle Dolly, MD  ?telmisartan-hydrochlorothiazide (MICARDIS HCT) 80-25 MG tablet Take 1 tablet by mouth daily. 07/21/17   [provider]  ?Vitamin D, Ergocalciferol, (DRISDOL) 1.25 MG (50000 UT) CAPS capsule Take 50,000 Units by  mouth daily.    [provider]  ?  ? ?Family History  ?Problem Relation Age of Onset  ? Heart disease Mother   ? Stroke Mother   ? Cancer Father   ?     colon  ? Alzheimer's disease Sister   ? Alzheimer's disease Sister   ? Cancer Brother   ?     brain  ? Breast cancer Neg Hx   ? ? ?Social History  ? ?Socioeconomic History  ? Marital status: Widowed  ?  Spouse name: Not on file  ? Number of children: Not on file  ? Years of education: Not on file  ? Highest education level: Not on file  ?Occupational History  ? Not on file  ?Tobacco Use  ? Smoking status: Never  ? Smokeless tobacco: Never  ?Vaping Use  ? Vaping Use: Never used  ?Substance and Sexual Activity  ? Alcohol use: No  ?  Drug use: No  ? Sexual activity: Not on file  ?Other Topics Concern  ? Not on file  ?Social History Narrative  ? Not on file  ? ?Social Determinants of Health  ? ?Financial Resource Strain: Not on file  ?Food Insecurity: Not on file  ?Transportation Needs: Not on file  ?Physical Activity: Not on file  ?Stress: Not on file  ?Social Connections: Not on file  ? ? ?ECOG Status: ?1 - Symptomatic but completely ambulatory ? ?Review of Systems: A 12 point ROS discussed and pertinent positives are indicated in the HPI above.  All other systems are negative. ? ?Review of Systems ? ?Vital Signs: ?BP 127/60   Pulse 76   Temp 97.6 ?F (36.4 ?C)   SpO2 98%  ? ?Physical Exam ? ?Mallampati Score: ? ?  ? ?Imaging: ? ?CT abdomen pelvis-05/03/2021; 09/07/2020; abdominal MRI-08/21/2018 ? ?Personal review of contrast-enhanced CT scan of the abdomen and pelvis performed 05/03/2021 demonstrates an approximately 4.3 x 3.8 x 3.0 cm enhancing mass involving the inferior medial aspect of the left kidney (coronal image 72, series 900; axial image 72, series 6), slightly increased in size compared to abdominal CT performed 09/07/2020, previously,  ?4.0 x 3.5 x 3.4 cm, and noted to be approximately 2.9 x 2.8 cm on noncontrast abdominal MRI performed  09/17/2018. ? ? ?Labs: ? ?CBC: ?Recent Labs  ?  11/24/20 ?1301 11/24/20 ?1306 12/29/20 ?1330 01/26/21 ?1348 01/26/21 ?1349 02/23/21 ?1204 02/23/21 ?1209  ?WBC 12.5*  --  9.1 8.3  --  7.1  --   ?HGB 9.4*   < > 8.6* 9.9* 9.9*

## 2021-05-21 ENCOUNTER — Other Ambulatory Visit: Payer: Self-pay

## 2021-05-21 DIAGNOSIS — N2581 Secondary hyperparathyroidism of renal origin: Secondary | ICD-10-CM | POA: Diagnosis not present

## 2021-05-21 DIAGNOSIS — N183 Chronic kidney disease, stage 3 unspecified: Secondary | ICD-10-CM

## 2021-05-21 DIAGNOSIS — N186 End stage renal disease: Secondary | ICD-10-CM | POA: Diagnosis not present

## 2021-05-21 DIAGNOSIS — Z992 Dependence on renal dialysis: Secondary | ICD-10-CM | POA: Diagnosis not present

## 2021-05-22 DIAGNOSIS — I10 Essential (primary) hypertension: Secondary | ICD-10-CM | POA: Diagnosis not present

## 2021-05-22 DIAGNOSIS — N186 End stage renal disease: Secondary | ICD-10-CM | POA: Diagnosis not present

## 2021-05-22 DIAGNOSIS — Z992 Dependence on renal dialysis: Secondary | ICD-10-CM | POA: Diagnosis not present

## 2021-05-23 ENCOUNTER — Encounter: Payer: Self-pay | Admitting: Vascular Surgery

## 2021-05-23 ENCOUNTER — Ambulatory Visit (HOSPITAL_COMMUNITY)
Admission: RE | Admit: 2021-05-23 | Discharge: 2021-05-23 | Disposition: A | Discharge status: Home / Self Care | Payer: Medicare PPO | Source: Ambulatory Visit | Attending: Vascular Surgery | Admitting: Vascular Surgery

## 2021-05-23 ENCOUNTER — Ambulatory Visit (INDEPENDENT_AMBULATORY_CARE_PROVIDER_SITE_OTHER): Payer: Medicare PPO | Admitting: Vascular Surgery

## 2021-05-23 VITALS — BP 121/71 | HR 81 | Temp 98.0°F | Resp 20 | Ht 64.5 in | Wt 124.0 lb

## 2021-05-23 DIAGNOSIS — Z992 Dependence on renal dialysis: Secondary | ICD-10-CM | POA: Diagnosis not present

## 2021-05-23 DIAGNOSIS — N186 End stage renal disease: Secondary | ICD-10-CM

## 2021-05-23 DIAGNOSIS — M79642 Pain in left hand: Secondary | ICD-10-CM | POA: Diagnosis not present

## 2021-05-23 DIAGNOSIS — N183 Chronic kidney disease, stage 3 unspecified: Secondary | ICD-10-CM | POA: Insufficient documentation

## 2021-05-23 DIAGNOSIS — N2581 Secondary hyperparathyroidism of renal origin: Secondary | ICD-10-CM | POA: Diagnosis not present

## 2021-05-23 NOTE — Progress Notes (Signed)
? ?Patient ID: Kim Burns, female   DOB: 06-Jun-1935, 86 y.o.   MRN: [id number] ? ?Reason for Consult: New Patient (Initial Visit) ?  ?Referred by [name], MD ? ?Subjective:  ?   ?HPI: ? ?Kim Burns is a 86 y.o. female history of end-stage renal disease with left second stage basilic vein fistula created 4 years ago.  She dialyzes Mondays Wednesdays and Fridays does not take any blood thinners.  She states that she has pain in her hand and forearm in the middle of the night and is worse when she is on dialysis.  She has tried Tylenol and warming the hand and using Aspercreme but this does not help.  She is never had any endovascular interventions of her left upper extremity or on her fistula.  Pain is associated with numbness. ? ?Past Medical History:  ?Diagnosis Date  ? Anemia of chronic disease 1985  ? hysterectomy fibroid tumors  ? Anxiety   ? Bleeding ulcer   ? Blood transfusion without reported diagnosis   ? Bradycardia   ? CKD (chronic kidney disease) stage 3, GFR 30-59 ml/min (HCC)   ? sees dr Arty Baumgartner sees next in jan 2018  ? Diabetes mellitus without complication ([place])   ? became diabetic after Whipple procedure  ? Diarrhea   ? Dysrhythmia 04/16/2016  ? bradycardia due to medication   ? GERD (gastroesophageal reflux disease)   ? Gout   ? Headache   ? History of kidney stones 06/2013  ? Hyperparathyroidism ([place])   ? Hypertension   ? Itching   ? last year  ? PONV (postoperative nausea and vomiting)   ? Sleep apnea   ? no cpap machine. could not tolerate  ? Stroke ([place]) 04/16/2016  ? TIA 1995  ? TIA (transient ischemic attack) 1996  ? x 1  ? Vitamin D deficiency   ? ?Family History  ?Problem Relation Age of Onset  ? Heart disease Mother   ? Stroke Mother   ? Cancer Father   ?     colon  ? Alzheimer's disease Sister   ? Alzheimer's disease Sister   ? Cancer Brother   ?     brain  ? Breast cancer Neg Hx   ? ?Past Surgical History:  ?Procedure Laterality Date  ? ABDOMINAL HYSTERECTOMY    ?  complete  ? APPENDECTOMY    ? with gallbladder  ? BACK SURGERY    ? lower  ? BASCILIC VEIN TRANSPOSITION Left 04/24/2017  ? Procedure: LEFT ARM FIRST STAGE BASILIC VEIN TRANSPOSITION;  Surgeon: Serafina [place], MD;  Location: [facility];  Service: Vascular;  Laterality: Left;  ? BASCILIC VEIN TRANSPOSITION Left 07/10/2017  ? Procedure: SECOND STAGE BASILIC VEIN TRANSPOSITION LEFT ARM;  Surgeon: Serafina [place], MD;  [facility];  Service: Vascular;  Laterality: Left;  ? BREAST LUMPECTOMY Left x 2  ? many years apart, benign  ? CHOLECYSTECTOMY    ? CYSTOSCOPY WITH URETEROSCOPY AND STENT PLACEMENT Left 06/18/2013  ? Procedure: CYSTOSCOPY WITH Lef URETEROSCOPY AND Left STENT PLACEMENT;  Surgeon: Dutch Gray, MD;  [facility];  Service: Urology;  Laterality: Left;  ? ESOPHAGOGASTRODUODENOSCOPY (EGD) WITH PROPOFOL N/A 11/13/2016  ? Procedure: ESOPHAGOGASTRODUODENOSCOPY (EGD) WITH PROPOFOL;  Surgeon: Otis Brace, MD;  [facility];  Service: Gastroenterology;  Laterality: N/A;  ? EUS N/A 02/07/2016  ? Procedure: ESOPHAGEAL ENDOSCOPIC ULTRASOUND (EUS) RADIAL;  Surgeon: Arta Silence, MD;  [facility];  Service: Endoscopy;  Laterality: N/A;  ? EYE SURGERY Bilateral 2014  ? ioc for catracts   ? FOOT SURGERY    ? something with toes unsure what   ? HERNIA REPAIR    ? IR RADIOLOGIST EVAL & MGMT  05/18/2021  ? PARATHYROID EXPLORATION    ? WHIPPLE PROCEDURE N/A 04/23/2016  ? Procedure: WHIPPLE PROCEDURE;  Surgeon: Stark Klein, MD;  Location: [facility];  Service: General;  Laterality: N/A;  ? ? ?Short Social History:  ?Social History  ? ?Tobacco Use  ? Smoking status: Never  ? Smokeless tobacco: Never  ?Substance Use Topics  ? Alcohol use: No  ? ? ?Allergies  ?Allergen Reactions  ? Gemfibrozil Other (See Comments)  ?  Myalgia   ? Lotemax [Loteprednol Etabonate] Other (See Comments)  ?  burning  ? Statins Other (See Comments)  ?  Muscle weakness  ? Doxycycline Other (See Comments)  ?  UNSPECIFIED REACTION   ?  Codeine Other (See Comments)  ?  headache  ? Hydrocodone-Acetaminophen Other (See Comments)  ?  Headache. Tolerates acetaminophen.  ? ? ?Current Outpatient Medications  ?Medication Sig Dispense Refill  ? diclofenac sodium (VOLTAREN) 1 % GEL Apply 2 g topically 4 (four) times daily as needed (for hand pain).     ? escitalopram (LEXAPRO) 5 MG tablet Take 5 mg by mouth 3 (three) times daily.     ? fenofibrate (TRICOR) 145 MG tablet Take 72.5 mg by mouth every morning.     ? furosemide (LASIX) 80 MG tablet Take 80 mg by mouth daily.    ? insulin lispro (HUMALOG) 100 UNIT/ML injection Inject 2-4 Units into the skin 3 (three) times daily before meals. Sliding scale before meals three times a day  ?150-200 Give 2 units ?200-250 give 3 units ?250-300 give 4 units     ? iron polysaccharides (NIFEREX) 150 MG capsule Take 150 mg by mouth 2 (two) times daily.    ? lipase/protease/amylase (CREON) 12000-38000 units CPEP capsule Take by mouth.    ? metoprolol succinate (TOPROL-XL) 25 MG 24 hr tablet Take 25 mg by mouth daily.  4  ? Multiple Vitamins-Minerals (EYE VITAMINS & MINERALS PO) Take 1 tablet by mouth daily.    ? Pancrelipase, Lip-Prot-Amyl, (ZENPEP PO) Take by mouth. 2 with meals and 1 with snacks    ? pantoprazole (PROTONIX) 40 MG tablet Take 40 mg by mouth every morning.     ? Propylene Glycol 0.6 % SOLN Place 1 drop into both eyes 4 (four) times daily as needed (for dry eyes).    ? sevelamer carbonate (RENVELA) 800 MG tablet Take 800 mg by mouth 3 (three) times daily with meals. 2 tabs daily with meals    ? sodium bicarbonate 650 MG tablet Take 2 tablets (1,300 mg total) by mouth 2 (two) times daily. 60 tablet 0  ? telmisartan-hydrochlorothiazide (MICARDIS HCT) 80-25 MG tablet Take 1 tablet by mouth daily.  0  ? Vitamin D, Ergocalciferol, (DRISDOL) 1.25 MG (50000 UT) CAPS capsule Take 50,000 Units by mouth daily.    ? pantoprazole (PROTONIX) 40 MG tablet Take 1 tablet (40 mg total) by mouth 2 (two) times daily before a  meal. (Patient taking differently: Take 40 mg by mouth daily. ) 60 tablet 0  ? ?No current facility-administered medications for this visit.  ? ? ?Review of Systems  ?Constitutional:  Constitutional negative. ?HENT: HENT negative.  ?Eyes: Eyes negative.  ?Cardiovascular: Cardiovascular negative.  ?GI: Gastrointestinal negative.  ?Musculoskeletal:  ?  Left hand and forearm pain ?Skin: Skin negative.  ?Neurological: Positive for numbness.  ?Hematologic: Hematologic/lymphatic negative.  ?Psychiatric: Psychiatric negative.   ? ?   ?Objective:  ?Objective  ? ?Vitals:  ? 05/23/21 1337  ?BP: 121/71  ?Pulse: 81  ?Resp: 20  ?Temp: 98 ?F (36.7 ?C)  ?SpO2: 96%  ?Weight: 124 lb (56.2 kg)  ?Height: 5' 4.5" (1.638 m)  ? ?Body mass index is 20.96 kg/m?. ? ?Physical Exam ?HENT:  ?   Head: Normocephalic.  ?   Nose: Nose normal.  ?Eyes:  ?   Pupils: Pupils are equal, round, and reactive to light.  ?Cardiovascular:  ?   Comments: Easily palpable left radial and ulnar pulses ?Pulmonary:  ?   Effort: Pulmonary effort is normal.  ?Abdominal:  ?   General: Abdomen is flat.  ?Musculoskeletal:  ?   Comments: Some pulsatility in the left upper extremity fistula  ?Neurological:  ?   General: No focal deficit present.  ?   Mental Status: She is alert.  ?Psychiatric:     ?   Mood and Affect: Mood normal.     ?   Behavior: Behavior normal.     ?   Thought Content: Thought content normal.  ? ? ?Data: ?Brachial                   134 mmHg                  ?+---------------------------+--------+--------+--------+  ?Radial Ambient                                       ?+---------------------------+--------+--------+--------+  ?Radial AV Compression                                ?+---------------------------+--------+--------+--------+  ?Ulnar Ambient                                        ?+---------------------------+--------+--------+--------+  ?Ulnar AV Compression                                  ?+---------------------------+--------+--------+--------+  ?2nd Digit Ambient          141 mmHg121 mmHg          ?+---------------------------+--------+--------+--------+  ?2nd Digit Ulnar Compression        137 mmHg          ?+-------------------

## 2021-05-25 DIAGNOSIS — N2581 Secondary hyperparathyroidism of renal origin: Secondary | ICD-10-CM | POA: Diagnosis not present

## 2021-05-25 DIAGNOSIS — N186 End stage renal disease: Secondary | ICD-10-CM | POA: Diagnosis not present

## 2021-05-25 DIAGNOSIS — Z992 Dependence on renal dialysis: Secondary | ICD-10-CM | POA: Diagnosis not present

## 2021-05-28 ENCOUNTER — Other Ambulatory Visit: Payer: Self-pay | Admitting: Interventional Radiology

## 2021-05-28 DIAGNOSIS — N186 End stage renal disease: Secondary | ICD-10-CM | POA: Diagnosis not present

## 2021-05-28 DIAGNOSIS — N2581 Secondary hyperparathyroidism of renal origin: Secondary | ICD-10-CM | POA: Diagnosis not present

## 2021-05-28 DIAGNOSIS — N2889 Other specified disorders of kidney and ureter: Secondary | ICD-10-CM

## 2021-05-28 DIAGNOSIS — Z992 Dependence on renal dialysis: Secondary | ICD-10-CM | POA: Diagnosis not present

## 2021-05-30 DIAGNOSIS — N186 End stage renal disease: Secondary | ICD-10-CM | POA: Diagnosis not present

## 2021-05-30 DIAGNOSIS — Z992 Dependence on renal dialysis: Secondary | ICD-10-CM | POA: Diagnosis not present

## 2021-05-30 DIAGNOSIS — N2581 Secondary hyperparathyroidism of renal origin: Secondary | ICD-10-CM | POA: Diagnosis not present

## 2021-05-31 DIAGNOSIS — E1165 Type 2 diabetes mellitus with hyperglycemia: Secondary | ICD-10-CM | POA: Diagnosis not present

## 2021-06-01 DIAGNOSIS — N186 End stage renal disease: Secondary | ICD-10-CM | POA: Diagnosis not present

## 2021-06-01 DIAGNOSIS — N2581 Secondary hyperparathyroidism of renal origin: Secondary | ICD-10-CM | POA: Diagnosis not present

## 2021-06-01 DIAGNOSIS — Z992 Dependence on renal dialysis: Secondary | ICD-10-CM | POA: Diagnosis not present

## 2021-06-04 ENCOUNTER — Ambulatory Visit
Admission: RE | Admit: 2021-06-04 | Discharge: 2021-06-04 | Disposition: A | Discharge status: Home / Self Care | Payer: Medicare PPO | Source: Ambulatory Visit | Attending: Interventional Radiology | Admitting: Interventional Radiology

## 2021-06-04 DIAGNOSIS — Z992 Dependence on renal dialysis: Secondary | ICD-10-CM | POA: Diagnosis not present

## 2021-06-04 DIAGNOSIS — N2581 Secondary hyperparathyroidism of renal origin: Secondary | ICD-10-CM | POA: Diagnosis not present

## 2021-06-04 DIAGNOSIS — N2889 Other specified disorders of kidney and ureter: Secondary | ICD-10-CM | POA: Diagnosis not present

## 2021-06-04 DIAGNOSIS — N186 End stage renal disease: Secondary | ICD-10-CM | POA: Diagnosis not present

## 2021-06-04 NOTE — Progress Notes (Signed)
Patient ID: Kim Burns, female   DOB: 1935/06/17, 86 y.o.   MRN: [id number] ? ?    ? ? ?Chief Complaint: ?Left sided renal mass ?  ?Referring Physician(s): ?Herrick,Benjamin W ?  ?History of Present Illness: ?Kim Burns is a 86 y.o. female with past medical history significant for stroke, diabetes (post Whipple for IPMN with high-grade dysplasia), bradycardia, anemia of chronic disease and chronic renal insufficiency, now progressed to end-stage renal disease, initiated on dialysis in January of this year who is seen today in telemedicine consultation following my discussion with Dr. Louis Meckel who deemed the patient a poor operative candidate given her advanced age, multiple medical comorbidities and history of previous Whipple. ?  ?Patient is expectedly fatigued since initiation of hemodialysis earlier this year.  She is otherwise in her baseline state of health.  She remains asymptomatic in regards to this incidentally discovered left-sided renal mass.  Specifically, no flank pain or unintentional weight loss. ?  ?She lives alone and is independent with all ADLs though has many involved family members. ? ?Again, the patient is interested in pursuing all available treatment options ? ?Past Medical History:  ?Diagnosis Date  ? Anemia of chronic disease 1985  ? hysterectomy fibroid tumors  ? Anxiety   ? Bleeding ulcer   ? Blood transfusion without reported diagnosis   ? Bradycardia   ? CKD (chronic kidney disease) stage 3, GFR 30-59 ml/min (HCC)   ? sees dr Arty Baumgartner sees next in jan 2018  ? Diabetes mellitus without complication ([place])   ? became diabetic after Whipple procedure  ? Diarrhea   ? Dysrhythmia 04/16/2016  ? bradycardia due to medication   ? GERD (gastroesophageal reflux disease)   ? Gout   ? Headache   ? History of kidney stones 06/2013  ? Hyperparathyroidism ([place])   ? Hypertension   ? Itching   ? last year  ? PONV (postoperative nausea and vomiting)   ? Sleep apnea   ? no cpap machine. could not  tolerate  ? Stroke ([place]) 04/16/2016  ? TIA 1995  ? TIA (transient ischemic attack) 1996  ? x 1  ? Vitamin D deficiency   ? ? ?Past Surgical History:  ?Procedure Laterality Date  ? ABDOMINAL HYSTERECTOMY    ? complete  ? APPENDECTOMY    ? with gallbladder  ? BACK SURGERY    ? lower  ? BASCILIC VEIN TRANSPOSITION Left 04/24/2017  ? Procedure: LEFT ARM FIRST STAGE BASILIC VEIN TRANSPOSITION;  Surgeon: Serafina [place], MD;  Location: [facility];  Service: Vascular;  Laterality: Left;  ? BASCILIC VEIN TRANSPOSITION Left 07/10/2017  ? Procedure: SECOND STAGE BASILIC VEIN TRANSPOSITION LEFT ARM;  Surgeon: Serafina [facility], MD;  [facility];  Service: Vascular;  Laterality: Left;  ? BREAST LUMPECTOMY Left x 2  ? many years apart, benign  ? CHOLECYSTECTOMY    ? CYSTOSCOPY WITH URETEROSCOPY AND STENT PLACEMENT Left 06/18/2013  ? Procedure: CYSTOSCOPY WITH Lef URETEROSCOPY AND Left STENT PLACEMENT;  Surgeon: Dutch Gray, MD;  [facility];  Service: Urology;  Laterality: Left;  ? ESOPHAGOGASTRODUODENOSCOPY (EGD) WITH PROPOFOL N/A 11/13/2016  ? Procedure: ESOPHAGOGASTRODUODENOSCOPY (EGD) WITH PROPOFOL;  Surgeon: Otis Brace, MD;  [facility];  Service: Gastroenterology;  Laterality: N/A;  ? EUS N/A 02/07/2016  ? Procedure: ESOPHAGEAL ENDOSCOPIC ULTRASOUND (EUS) RADIAL;  Surgeon: Arta Silence, MD;  [facility];  Service: Endoscopy;  Laterality: N/A;  ? EYE SURGERY Bilateral 2014  ? ioc for catracts   ?  FOOT SURGERY    ? something with toes unsure what   ? HERNIA REPAIR    ? IR RADIOLOGIST EVAL & MGMT  05/18/2021  ? PARATHYROID EXPLORATION    ? WHIPPLE PROCEDURE N/A 04/23/2016  ? Procedure: WHIPPLE PROCEDURE;  Surgeon: Stark Klein, MD;  Location: [facility];  Service: General;  Laterality: N/A;  ? ? ?Allergies: ?Gemfibrozil, Lotemax [loteprednol etabonate], Statins, Doxycycline, Codeine, and Hydrocodone-acetaminophen ? ?Medications: ?Prior to Admission medications   ?Medication Sig Start Date End Date Taking?  Authorizing Provider  ?diclofenac sodium (VOLTAREN) 1 % GEL Apply 2 g topically 4 (four) times daily as needed (for hand pain).     [provider]  ?escitalopram (LEXAPRO) 5 MG tablet Take 5 mg by mouth 3 (three) times daily.     [provider]  ?fenofibrate (TRICOR) 145 MG tablet Take 72.5 mg by mouth every morning.     [provider]  ?furosemide (LASIX) 80 MG tablet Take 80 mg by mouth daily.    [provider]  ?insulin lispro (HUMALOG) 100 UNIT/ML injection Inject 2-4 Units into the skin 3 (three) times daily before meals. Sliding scale before meals three times a day  ?150-200 Give 2 units ?200-250 give 3 units ?250-300 give 4 units     [provider]  ?iron polysaccharides (NIFEREX) 150 MG capsule Take 150 mg by mouth 2 (two) times daily.    [provider]  ?lipase/protease/amylase (CREON) 12000-38000 units CPEP capsule Take by mouth.    [provider]  ?metoprolol succinate (TOPROL-XL) 25 MG 24 hr tablet Take 25 mg by mouth daily. 12/17/16   [provider]  ?Multiple Vitamins-Minerals (EYE VITAMINS & MINERALS PO) Take 1 tablet by mouth daily.    [provider]  ?Pancrelipase, Lip-Prot-Amyl, (ZENPEP PO) Take by mouth. 2 with meals and 1 with snacks    [provider]  ?pantoprazole (PROTONIX) 40 MG tablet Take 1 tablet (40 mg total) by mouth 2 (two) times daily before a meal. ?Patient taking differently: Take 40 mg by mouth daily.  11/14/16 07/02/17  Aline August, MD  ?pantoprazole (PROTONIX) 40 MG tablet Take 40 mg by mouth every morning.  08/04/17   [provider]  ?Propylene Glycol 0.6 % SOLN Place 1 drop into both eyes 4 (four) times daily as needed (for dry eyes).    [provider]  ?sevelamer carbonate (RENVELA) 800 MG tablet Take 800 mg by mouth 3 (three) times daily with meals. 2 tabs daily with meals    [provider]  ?sodium bicarbonate 650 MG tablet Take 2 tablets (1,300 mg  total) by mouth 2 (two) times daily. 10/07/16   Carlyle Dolly, MD  ?telmisartan-hydrochlorothiazide (MICARDIS HCT) 80-25 MG tablet Take 1 tablet by mouth daily. 07/21/17   [provider]  ?Vitamin D, Ergocalciferol, (DRISDOL) 1.25 MG (50000 UT) CAPS capsule Take 50,000 Units by mouth daily.    [provider]  ?  ? ?Family History  ?Problem Relation Age of Onset  ? Heart disease Mother   ? Stroke Mother   ? Cancer Father   ?     colon  ? Alzheimer's disease Sister   ? Alzheimer's disease Sister   ? Cancer Brother   ?     brain  ? Breast cancer Neg Hx   ? ? ?Social History  ? ?Socioeconomic History  ? Marital status: Widowed  ?  Spouse name: Not on file  ? Number of children: Not on  file  ? Years of education: Not on file  ? Highest education level: Not on file  ?Occupational History  ? Not on file  ?Tobacco Use  ? Smoking status: Never  ? Smokeless tobacco: Never  ?Vaping Use  ? Vaping Use: Never used  ?Substance and Sexual Activity  ? Alcohol use: No  ? Drug use: No  ? Sexual activity: Not on file  ?Other Topics Concern  ? Not on file  ?Social History Narrative  ? Not on file  ? ?Social Determinants of Health  ? ?Financial Resource Strain: Not on file  ?Food Insecurity: Not on file  ?Transportation Needs: Not on file  ?Physical Activity: Not on file  ?Stress: Not on file  ?Social Connections: Not on file  ? ? ?ECOG Status: ?2 - Symptomatic, <50% confined to bed ? ?Review of Systems ? ?Review of Systems: A 12 point ROS discussed and pertinent positives are indicated in the HPI above.  All other systems are negative. ? ?Physical Exam ?No direct physical exam was performed (except for noted visual exam findings with Video Visits).  ? ?Vital Signs: ?There were no vitals taken for this visit. ? ?Imaging: ? ?CT abdomen pelvis-05/03/2021; 09/07/2020; abdominal MRI-08/21/2018 ? ?Personal review of contrast-enhanced CT scan of the abdomen and pelvis performed 05/03/2021 demonstrates an approximately 4.3 x  3.8 x 3.0 cm enhancing mass involving the inferior medial aspect of the left kidney (coronal image 72, series 900; axial image 72, series 6), slightly increased in size compared to abdominal CT perf

## 2021-06-05 DIAGNOSIS — M79641 Pain in right hand: Secondary | ICD-10-CM | POA: Diagnosis not present

## 2021-06-05 DIAGNOSIS — M79642 Pain in left hand: Secondary | ICD-10-CM | POA: Diagnosis not present

## 2021-06-05 DIAGNOSIS — G5603 Carpal tunnel syndrome, bilateral upper limbs: Secondary | ICD-10-CM | POA: Diagnosis not present

## 2021-06-06 DIAGNOSIS — N2581 Secondary hyperparathyroidism of renal origin: Secondary | ICD-10-CM | POA: Diagnosis not present

## 2021-06-06 DIAGNOSIS — Z992 Dependence on renal dialysis: Secondary | ICD-10-CM | POA: Diagnosis not present

## 2021-06-06 DIAGNOSIS — N186 End stage renal disease: Secondary | ICD-10-CM | POA: Diagnosis not present

## 2021-06-07 DIAGNOSIS — Z992 Dependence on renal dialysis: Secondary | ICD-10-CM | POA: Diagnosis not present

## 2021-06-07 DIAGNOSIS — N186 End stage renal disease: Secondary | ICD-10-CM | POA: Diagnosis not present

## 2021-06-07 DIAGNOSIS — N2581 Secondary hyperparathyroidism of renal origin: Secondary | ICD-10-CM | POA: Diagnosis not present

## 2021-06-12 DIAGNOSIS — N186 End stage renal disease: Secondary | ICD-10-CM | POA: Diagnosis not present

## 2021-06-12 DIAGNOSIS — Z992 Dependence on renal dialysis: Secondary | ICD-10-CM | POA: Diagnosis not present

## 2021-06-14 DIAGNOSIS — N186 End stage renal disease: Secondary | ICD-10-CM | POA: Diagnosis not present

## 2021-06-14 DIAGNOSIS — Z992 Dependence on renal dialysis: Secondary | ICD-10-CM | POA: Diagnosis not present

## 2021-06-16 DIAGNOSIS — N186 End stage renal disease: Secondary | ICD-10-CM | POA: Diagnosis not present

## 2021-06-16 DIAGNOSIS — Z992 Dependence on renal dialysis: Secondary | ICD-10-CM | POA: Diagnosis not present

## 2021-06-17 DIAGNOSIS — N186 End stage renal disease: Secondary | ICD-10-CM | POA: Diagnosis not present

## 2021-06-17 DIAGNOSIS — Z992 Dependence on renal dialysis: Secondary | ICD-10-CM | POA: Diagnosis not present

## 2021-06-17 DIAGNOSIS — I158 Other secondary hypertension: Secondary | ICD-10-CM | POA: Diagnosis not present

## 2021-06-19 DIAGNOSIS — N186 End stage renal disease: Secondary | ICD-10-CM | POA: Diagnosis not present

## 2021-06-19 DIAGNOSIS — Z992 Dependence on renal dialysis: Secondary | ICD-10-CM | POA: Diagnosis not present

## 2021-06-21 DIAGNOSIS — N186 End stage renal disease: Secondary | ICD-10-CM | POA: Diagnosis not present

## 2021-06-21 DIAGNOSIS — Z992 Dependence on renal dialysis: Secondary | ICD-10-CM | POA: Diagnosis not present

## 2021-06-23 DIAGNOSIS — Z992 Dependence on renal dialysis: Secondary | ICD-10-CM | POA: Diagnosis not present

## 2021-06-23 DIAGNOSIS — N186 End stage renal disease: Secondary | ICD-10-CM | POA: Diagnosis not present

## 2021-06-25 DIAGNOSIS — I10 Essential (primary) hypertension: Secondary | ICD-10-CM | POA: Diagnosis not present

## 2021-06-25 DIAGNOSIS — E109 Type 1 diabetes mellitus without complications: Secondary | ICD-10-CM | POA: Diagnosis not present

## 2021-06-25 DIAGNOSIS — N186 End stage renal disease: Secondary | ICD-10-CM | POA: Diagnosis not present

## 2021-06-26 DIAGNOSIS — Z992 Dependence on renal dialysis: Secondary | ICD-10-CM | POA: Diagnosis not present

## 2021-06-26 DIAGNOSIS — N186 End stage renal disease: Secondary | ICD-10-CM | POA: Diagnosis not present

## 2021-06-28 DIAGNOSIS — N186 End stage renal disease: Secondary | ICD-10-CM | POA: Diagnosis not present

## 2021-06-28 DIAGNOSIS — Z992 Dependence on renal dialysis: Secondary | ICD-10-CM | POA: Diagnosis not present

## 2021-06-30 DIAGNOSIS — Z992 Dependence on renal dialysis: Secondary | ICD-10-CM | POA: Diagnosis not present

## 2021-06-30 DIAGNOSIS — N186 End stage renal disease: Secondary | ICD-10-CM | POA: Diagnosis not present

## 2021-07-03 DIAGNOSIS — N186 End stage renal disease: Secondary | ICD-10-CM | POA: Diagnosis not present

## 2021-07-03 DIAGNOSIS — Z992 Dependence on renal dialysis: Secondary | ICD-10-CM | POA: Diagnosis not present

## 2021-07-03 DIAGNOSIS — N2581 Secondary hyperparathyroidism of renal origin: Secondary | ICD-10-CM | POA: Diagnosis not present

## 2021-07-04 DIAGNOSIS — G5613 Other lesions of median nerve, bilateral upper limbs: Secondary | ICD-10-CM | POA: Diagnosis not present

## 2021-07-05 ENCOUNTER — Other Ambulatory Visit: Payer: Self-pay | Admitting: Interventional Radiology

## 2021-07-05 DIAGNOSIS — N2889 Other specified disorders of kidney and ureter: Secondary | ICD-10-CM

## 2021-07-05 DIAGNOSIS — N186 End stage renal disease: Secondary | ICD-10-CM | POA: Diagnosis not present

## 2021-07-05 DIAGNOSIS — Z992 Dependence on renal dialysis: Secondary | ICD-10-CM | POA: Diagnosis not present

## 2021-07-05 DIAGNOSIS — N2581 Secondary hyperparathyroidism of renal origin: Secondary | ICD-10-CM | POA: Diagnosis not present

## 2021-07-06 DIAGNOSIS — N186 End stage renal disease: Secondary | ICD-10-CM | POA: Diagnosis not present

## 2021-07-06 DIAGNOSIS — N2581 Secondary hyperparathyroidism of renal origin: Secondary | ICD-10-CM | POA: Diagnosis not present

## 2021-07-06 DIAGNOSIS — Z992 Dependence on renal dialysis: Secondary | ICD-10-CM | POA: Diagnosis not present

## 2021-07-09 DIAGNOSIS — Z992 Dependence on renal dialysis: Secondary | ICD-10-CM | POA: Diagnosis not present

## 2021-07-09 DIAGNOSIS — N2581 Secondary hyperparathyroidism of renal origin: Secondary | ICD-10-CM | POA: Diagnosis not present

## 2021-07-09 DIAGNOSIS — N186 End stage renal disease: Secondary | ICD-10-CM | POA: Diagnosis not present

## 2021-07-10 DIAGNOSIS — G5603 Carpal tunnel syndrome, bilateral upper limbs: Secondary | ICD-10-CM | POA: Diagnosis not present

## 2021-07-11 DIAGNOSIS — Z992 Dependence on renal dialysis: Secondary | ICD-10-CM | POA: Diagnosis not present

## 2021-07-11 DIAGNOSIS — N186 End stage renal disease: Secondary | ICD-10-CM | POA: Diagnosis not present

## 2021-07-11 DIAGNOSIS — N2581 Secondary hyperparathyroidism of renal origin: Secondary | ICD-10-CM | POA: Diagnosis not present

## 2021-07-13 DIAGNOSIS — N186 End stage renal disease: Secondary | ICD-10-CM | POA: Diagnosis not present

## 2021-07-13 DIAGNOSIS — N2581 Secondary hyperparathyroidism of renal origin: Secondary | ICD-10-CM | POA: Diagnosis not present

## 2021-07-13 DIAGNOSIS — Z992 Dependence on renal dialysis: Secondary | ICD-10-CM | POA: Diagnosis not present

## 2021-07-16 DIAGNOSIS — Z992 Dependence on renal dialysis: Secondary | ICD-10-CM | POA: Diagnosis not present

## 2021-07-16 DIAGNOSIS — N186 End stage renal disease: Secondary | ICD-10-CM | POA: Diagnosis not present

## 2021-07-18 DIAGNOSIS — Z992 Dependence on renal dialysis: Secondary | ICD-10-CM | POA: Diagnosis not present

## 2021-07-18 DIAGNOSIS — I158 Other secondary hypertension: Secondary | ICD-10-CM | POA: Diagnosis not present

## 2021-07-18 DIAGNOSIS — N186 End stage renal disease: Secondary | ICD-10-CM | POA: Diagnosis not present

## 2021-07-19 DIAGNOSIS — N186 End stage renal disease: Secondary | ICD-10-CM | POA: Diagnosis not present

## 2021-07-19 DIAGNOSIS — Z992 Dependence on renal dialysis: Secondary | ICD-10-CM | POA: Diagnosis not present

## 2021-07-20 DIAGNOSIS — Z992 Dependence on renal dialysis: Secondary | ICD-10-CM | POA: Diagnosis not present

## 2021-07-20 DIAGNOSIS — N186 End stage renal disease: Secondary | ICD-10-CM | POA: Diagnosis not present

## 2021-07-23 DIAGNOSIS — K591 Functional diarrhea: Secondary | ICD-10-CM | POA: Diagnosis not present

## 2021-07-23 DIAGNOSIS — N2581 Secondary hyperparathyroidism of renal origin: Secondary | ICD-10-CM | POA: Diagnosis not present

## 2021-07-23 DIAGNOSIS — K432 Incisional hernia without obstruction or gangrene: Secondary | ICD-10-CM | POA: Diagnosis not present

## 2021-07-23 DIAGNOSIS — Z992 Dependence on renal dialysis: Secondary | ICD-10-CM | POA: Diagnosis not present

## 2021-07-23 DIAGNOSIS — N186 End stage renal disease: Secondary | ICD-10-CM | POA: Diagnosis not present

## 2021-07-23 DIAGNOSIS — K8681 Exocrine pancreatic insufficiency: Secondary | ICD-10-CM | POA: Diagnosis not present

## 2021-07-23 DIAGNOSIS — D490 Neoplasm of unspecified behavior of digestive system: Secondary | ICD-10-CM | POA: Diagnosis not present

## 2021-07-23 NOTE — Progress Notes (Deleted)
Cardiology Office Note   Date:  07/23/2021   ID:  Kim Burns, Kim Burns Oct 20, 1935, MRN [id number]  PCP:  [name], MD  Cardiologist:   Precious Gilchrest Martinique, MD   No chief complaint on file.     History of Present Illness: Kim Burns is a 86 y.o. female who is seen at the request of Dr Ernie Hew for evaluation of ... She was seen remotely in 2014 for evaluation of bradycardia. She has a history of ESRD, TIA, HTN, and DM. Prior Echo in 2015 showed moderate LVH with normal systolic function. Mild to mod MR.    Past Medical History:  Diagnosis Date   Anemia of chronic disease 1985   hysterectomy fibroid tumors   Anxiety    Bleeding ulcer    Blood transfusion without reported diagnosis    Bradycardia    CKD (chronic kidney disease) stage 3, GFR 30-59 ml/min [facility])    sees dr Arty Baumgartner sees next in jan 2018   Diabetes mellitus without complication ([place])    became diabetic after Whipple procedure   Diarrhea    Dysrhythmia 04/16/2016   bradycardia due to medication    GERD (gastroesophageal reflux disease)    Gout    Headache    History of kidney stones 06/2013   Hyperparathyroidism ([place])    Hypertension    Itching    last year   PONV (postoperative nausea and vomiting)    Sleep apnea    no cpap machine. could not tolerate   Stroke ([place]) 04/16/2016   TIA 1995   TIA (transient ischemic attack) 1996   x 1   Vitamin D deficiency     Past Surgical History:  Procedure Laterality Date   ABDOMINAL HYSTERECTOMY     complete   APPENDECTOMY     with gallbladder   BACK SURGERY     lower   BASCILIC VEIN TRANSPOSITION Left 04/24/2017   Procedure: LEFT ARM FIRST STAGE BASILIC VEIN TRANSPOSITION;  Surgeon: Serafina [facility], MD;  [facility];  Service: Vascular;  Laterality: Left;   [place] Left 07/10/2017   Procedure: SECOND STAGE BASILIC VEIN TRANSPOSITION LEFT ARM;  Surgeon: Serafina [facility], MD;  [facility];  Service: Vascular;  Laterality:  Left;   BREAST LUMPECTOMY Left x 2   many years apart, benign   CHOLECYSTECTOMY     CYSTOSCOPY WITH URETEROSCOPY AND STENT PLACEMENT Left 06/18/2013   Procedure: CYSTOSCOPY WITH Lef URETEROSCOPY AND Left STENT PLACEMENT;  Surgeon: Dutch Gray, MD;  [facility];  Service: Urology;  Laterality: Left;   ESOPHAGOGASTRODUODENOSCOPY (EGD) WITH PROPOFOL N/A 11/13/2016   Procedure: ESOPHAGOGASTRODUODENOSCOPY (EGD) WITH PROPOFOL;  Surgeon: Otis Brace, MD;  Location: [facility];  Service: Gastroenterology;  Laterality: N/A;   EUS N/A 02/07/2016   Procedure: ESOPHAGEAL ENDOSCOPIC ULTRASOUND (EUS) RADIAL;  Surgeon: Arta Silence, MD;  [facility];  Service: Endoscopy;  Laterality: N/A;   EYE SURGERY Bilateral 2014   ioc for catracts    FOOT SURGERY     something with toes unsure what    HERNIA REPAIR     IR RADIOLOGIST EVAL & MGMT  05/18/2021   PARATHYROID EXPLORATION     WHIPPLE PROCEDURE N/A 04/23/2016   Procedure: WHIPPLE PROCEDURE;  Surgeon: Stark Klein, MD;  [facility];  Service: General;  Laterality: N/A;     Current Outpatient Medications  Medication Sig Dispense Refill   diclofenac sodium (VOLTAREN) 1 % GEL Apply 2 g topically  4 (four) times daily as needed (for hand pain).      escitalopram (LEXAPRO) 5 MG tablet Take 5 mg by mouth 3 (three) times daily.      fenofibrate (TRICOR) 145 MG tablet Take 72.5 mg by mouth every morning.      furosemide (LASIX) 80 MG tablet Take 80 mg by mouth daily.     insulin lispro (HUMALOG) 100 UNIT/ML injection Inject 2-4 Units into the skin 3 (three) times daily before meals. Sliding scale before meals three times a day  150-200 Give 2 units 200-250 give 3 units 250-300 give 4 units      iron polysaccharides (NIFEREX) 150 MG capsule Take 150 mg by mouth 2 (two) times daily.     lipase/protease/amylase (CREON) 12000-38000 units CPEP capsule Take by mouth.     metoprolol succinate (TOPROL-XL) 25 MG 24 hr tablet Take 25 mg by mouth  daily.  4   Multiple Vitamins-Minerals (EYE VITAMINS & MINERALS PO) Take 1 tablet by mouth daily.     Pancrelipase, Lip-Prot-Amyl, (ZENPEP PO) Take by mouth. 2 with meals and 1 with snacks     pantoprazole (PROTONIX) 40 MG tablet Take 1 tablet (40 mg total) by mouth 2 (two) times daily before a meal. (Patient taking differently: Take 40 mg by mouth daily. ) 60 tablet 0   pantoprazole (PROTONIX) 40 MG tablet Take 40 mg by mouth every morning.      Propylene Glycol 0.6 % SOLN Place 1 drop into both eyes 4 (four) times daily as needed (for dry eyes).     sevelamer carbonate (RENVELA) 800 MG tablet Take 800 mg by mouth 3 (three) times daily with meals. 2 tabs daily with meals     sodium bicarbonate 650 MG tablet Take 2 tablets (1,300 mg total) by mouth 2 (two) times daily. 60 tablet 0   telmisartan-hydrochlorothiazide (MICARDIS HCT) 80-25 MG tablet Take 1 tablet by mouth daily.  0   Vitamin D, Ergocalciferol, (DRISDOL) 1.25 MG (50000 UT) CAPS capsule Take 50,000 Units by mouth daily.     No current facility-administered medications for this visit.    Allergies:   Gemfibrozil, Lotemax [loteprednol etabonate], Statins, Doxycycline, Codeine, and Hydrocodone-acetaminophen    Social History:  The patient  reports that she has never smoked. She has never used smokeless tobacco. She reports that she does not drink alcohol and does not use drugs.   Family History:  The patient's ***family history includes Alzheimer's disease in her sister and sister; Cancer in her brother and father; Heart disease in her mother; Stroke in her mother.    ROS:  Please see the history of present illness.   Otherwise, review of systems are positive for {NONE DEFAULTED:18576}.   All other systems are reviewed and negative.    PHYSICAL EXAM: VS:  There were no vitals taken for this visit. , BMI There is no height or weight on file to calculate BMI. GEN: Well nourished, well developed, in no acute distress HEENT:  normal Neck: no JVD, carotid bruits, or masses Cardiac: ***RRR; no murmurs, rubs, or gallops,no edema  Respiratory:  clear to auscultation bilaterally, normal work of breathing GI: soft, nontender, nondistended, + BS MS: no deformity or atrophy Skin: warm and dry, no rash Neuro:  Strength and sensation are intact Psych: euthymic mood, full affect   EKG:  EKG {ACTION; IS/IS RWE:[id number]} ordered today. The ekg ordered today demonstrates ***   Recent Labs: 02/23/2021: ALT 70; BUN 65; Creatinine, Ser 4.46; Hemoglobin 10.7;  Platelets 263; Potassium 4.3; Sodium 138    Lipid Panel No results found for: CHOL, TRIG, HDL, CHOLHDL, VLDL, LDLCALC, LDLDIRECT    Wt Readings from Last 3 Encounters:  05/23/21 124 lb (56.2 kg)  02/23/21 125 lb (56.7 kg)  03/02/18 124 lb (56.2 kg)      Other studies Reviewed: Additional studies/ records that were reviewed today include:   Echo 06/19/13: Study Conclusions   - Left ventricle: The cavity size was normal. There was    severe focal basal and moderate concentric hypertrophy.    Systolic function was normal. The estimated ejection    fraction was in the range of 60% to 65%. Wall motion was    normal; there were no regional wall motion abnormalities.  - Aortic valve: Valve area: 1.69cm^2 (Vmax).  - Mitral valve: Mild to moderate regurgitation.  - Left atrium: The atrium was mildly dilated.  - Atrial septum: There was increased thickness of the    septum, consistent with lipomatous hypertrophy.    ASSESSMENT AND PLAN:  1.  ***   Current medicines are reviewed at length with the patient today.  The patient {ACTIONS; HAS/DOES NOT HAVE:19233} concerns regarding medicines.  The following changes have been made:  {PLAN; NO CHANGE:13088:s}  Labs/ tests ordered today include: *** No orders of the defined types were placed in this encounter.        Disposition:   FU with *** in {gen number 3-00:923300} {Days to years:10300}  Signed, Carlisia Geno  Martinique, MD  07/23/2021 7:32 AM    [facility] [address], [place], [place], [address] Phone [phone], Fax [phone]

## 2021-07-25 ENCOUNTER — Other Ambulatory Visit: Payer: Self-pay | Admitting: Orthopedic Surgery

## 2021-07-25 DIAGNOSIS — N2581 Secondary hyperparathyroidism of renal origin: Secondary | ICD-10-CM | POA: Diagnosis not present

## 2021-07-25 DIAGNOSIS — N186 End stage renal disease: Secondary | ICD-10-CM | POA: Diagnosis not present

## 2021-07-25 DIAGNOSIS — Z992 Dependence on renal dialysis: Secondary | ICD-10-CM | POA: Diagnosis not present

## 2021-07-26 ENCOUNTER — Telehealth: Payer: Self-pay | Admitting: Cardiology

## 2021-07-26 ENCOUNTER — Ambulatory Visit: Payer: Medicare PPO | Admitting: Cardiology

## 2021-07-26 NOTE — Telephone Encounter (Signed)
   Pre-operative Risk Assessment    Patient Name: Kim Burns  DOB: [birth year] MRN: [id number]      Request for Surgical Clearance    Procedure:   left carpal tunnel release   Date of Surgery:  Clearance 09/06/21                                 Surgeon:  Dr. Leanora Cover Surgeon's Group or Practice Name:  [facility] Phone number:  [phone] Fax number:  [phone]   Type of Clearance Requested:   - Medical    Type of Anesthesia:  Not Indicated   Additional requests/questions:   0  Signed, Milbert Coulter   07/26/2021, 8:49 AM

## 2021-07-27 DIAGNOSIS — Z992 Dependence on renal dialysis: Secondary | ICD-10-CM | POA: Diagnosis not present

## 2021-07-27 DIAGNOSIS — N2581 Secondary hyperparathyroidism of renal origin: Secondary | ICD-10-CM | POA: Diagnosis not present

## 2021-07-27 DIAGNOSIS — N186 End stage renal disease: Secondary | ICD-10-CM | POA: Diagnosis not present

## 2021-07-27 NOTE — Telephone Encounter (Signed)
   Name: Kim Burns  DOB: 11/07/1935  MRN: [id number]  Primary Cardiologist: Dr. Martinique  Chart reviewed as part of pre-operative protocol coverage. Because of Kim Burns's past medical history and time since last visit, she will require a follow-up in-office visit in order to better assess preoperative cardiovascular risk.  Pre-op covering staff: - Please schedule appointment and call patient to inform them. If patient already had an upcoming appointment within acceptable timeframe, please add "pre-op clearance" to the appointment notes so provider is aware. - Please contact requesting surgeon's office via preferred method (i.e, phone, fax) to inform them of need for appointment prior to surgery.   Lenna Sciara, NP  07/27/2021, 8:57 AM

## 2021-07-27 NOTE — Telephone Encounter (Signed)
Pt has appt 08/02/21 with Dr. Martinique. Will send FYI to requesting office the pt has appt. Once MD has cleared the pt he will have his nurse fax over the clearance notes and any recommendations.

## 2021-07-30 DIAGNOSIS — Z992 Dependence on renal dialysis: Secondary | ICD-10-CM | POA: Diagnosis not present

## 2021-07-30 DIAGNOSIS — N2581 Secondary hyperparathyroidism of renal origin: Secondary | ICD-10-CM | POA: Diagnosis not present

## 2021-07-30 DIAGNOSIS — N186 End stage renal disease: Secondary | ICD-10-CM | POA: Diagnosis not present

## 2021-07-30 NOTE — Progress Notes (Signed)
Cardiology Office Note   Date:  08/02/2021   ID:  Kim Burns, DOB Apr 22, 1935, MRN [id number]  PCP:  [name], MD  Cardiologist:   Saara Kijowski Martinique, MD   Chief Complaint  Patient presents with   New Patient (Initial Visit)   Shortness of Breath   Headache   Edema      History of Present Illness: Kim Burns is a 86 y.o. female who is seen at the request of Dr Ernie Hew.  She was seen remotely by me in 2014 for evaluation of bradycardia. She has a history of CKD and HTN. Echo in 2015 showed LVH with normal systolic function. She had a Whipple procedure in 2014 with pancreatectomy and partial bowel resection. She developed ESRD felt to be related to DM and HTN. She has been on dialysis since January this year. With dialysis she has developed orthostatic hypotension that is worse on dialysis days. Her antihypertensives have been discontinued and she is now on midodrine on dialysis days. No syncope. She also has bilateral carpel tunnel syndrome and surgery has been recommended. Seeing Dr Fredna Dow. She has a slow growing renal cancer on her left kidney and is planning on having microwave treatment for this. She denies any chest pain or SOB. Does tend to bleed a lot from her vascular access site with dialysis.     Past Medical History:  Diagnosis Date   Anemia of chronic disease 1985   hysterectomy fibroid tumors   Anxiety    Bleeding ulcer    Blood transfusion without reported diagnosis    Bradycardia    CKD (chronic kidney disease) stage 3, GFR 30-59 ml/min [facility])    sees dr Arty Baumgartner sees next in jan 2018   Diabetes mellitus without complication ([place])    became diabetic after Whipple procedure   Diarrhea    Dysrhythmia 04/16/2016   bradycardia due to medication    GERD (gastroesophageal reflux disease)    Gout    Headache    History of kidney stones 06/2013   Hyperparathyroidism ([place])    Hypertension    Itching    last year   PONV (postoperative nausea and  vomiting)    Sleep apnea    no cpap machine. could not tolerate   Stroke ([place]) 04/16/2016   TIA 1995   TIA (transient ischemic attack) 1996   x 1   Vitamin D deficiency     Past Surgical History:  Procedure Laterality Date   ABDOMINAL HYSTERECTOMY     complete   APPENDECTOMY     with gallbladder   BACK SURGERY     lower   BASCILIC VEIN TRANSPOSITION Left 04/24/2017   Procedure: LEFT ARM FIRST STAGE BASILIC VEIN TRANSPOSITION;  Surgeon: Serafina Mitchell, MD;  Location: [facility] OR;  Service: Vascular;  Laterality: Left;   Continental Left 07/10/2017   Procedure: SECOND STAGE BASILIC VEIN TRANSPOSITION LEFT ARM;  Surgeon: Serafina Mitchell, MD;  Location: [facility] OR;  Service: Vascular;  Laterality: Left;   BREAST LUMPECTOMY Left x 2   many years apart, benign   CHOLECYSTECTOMY     CYSTOSCOPY WITH URETEROSCOPY AND STENT PLACEMENT Left 06/18/2013   Procedure: CYSTOSCOPY WITH Lef URETEROSCOPY AND Left STENT PLACEMENT;  Surgeon: Dutch Gray, MD;  Location: [facility];  Service: Urology;  Laterality: Left;   ESOPHAGOGASTRODUODENOSCOPY (EGD) WITH PROPOFOL N/A 11/13/2016   Procedure: ESOPHAGOGASTRODUODENOSCOPY (EGD) WITH PROPOFOL;  Surgeon: Otis Brace, MD;  Location: [place];  Service: Gastroenterology;  Laterality: N/A;   EUS N/A 02/07/2016   Procedure: ESOPHAGEAL ENDOSCOPIC ULTRASOUND (EUS) RADIAL;  Surgeon: Arta Silence, MD;  Location: [facility];  Service: Endoscopy;  Laterality: N/A;   EYE SURGERY Bilateral 2014   ioc for catracts    FOOT SURGERY     something with toes unsure what    HERNIA REPAIR     IR RADIOLOGIST EVAL & MGMT  05/18/2021   PARATHYROID EXPLORATION     WHIPPLE PROCEDURE N/A 04/23/2016   Procedure: WHIPPLE PROCEDURE;  Surgeon: Stark Klein, MD;  Location: [facility] OR;  Service: General;  Laterality: N/A;     Current Outpatient Medications  Medication Sig Dispense Refill   acetaminophen (TYLENOL 8 HOUR ARTHRITIS PAIN) 650 MG CR tablet Take 650 mg by mouth  every 8 (eight) hours as needed for pain.     colestipol (COLESTID) 1 g tablet Take 1 g by mouth 2 (two) times daily.     diclofenac sodium (VOLTAREN) 1 % GEL Apply 2 g topically 4 (four) times daily as needed (for hand pain).      escitalopram (LEXAPRO) 5 MG tablet Take 5 mg by mouth 3 (three) times daily.      insulin lispro (HUMALOG) 100 UNIT/ML injection Inject 2-4 Units into the skin 3 (three) times daily before meals. Sliding scale before meals three times a day  150-200 Give 2 units 200-250 give 3 units 250-300 give 4 units      iron polysaccharides (NIFEREX) 150 MG capsule Take 150 mg by mouth 2 (two) times daily.     Lifitegrast (XIIDRA OP) Apply to eye.     lipase/protease/amylase (CREON) 12000-38000 units CPEP capsule Take by mouth.     midodrine (PROAMATINE) 10 MG tablet Take 10 mg by mouth 3 (three) times daily.     Multiple Vitamins-Minerals (EYE VITAMINS & MINERALS PO) Take 1 tablet by mouth daily.     Pancrelipase, Lip-Prot-Amyl, (ZENPEP PO) Take by mouth. 2 with meals and 1 with snacks     Propylene Glycol 0.6 % SOLN Place 1 drop into both eyes 4 (four) times daily as needed (for dry eyes).     sevelamer carbonate (RENVELA) 800 MG tablet Take 800 mg by mouth 3 (three) times daily with meals. 2 tabs daily with meals     sodium bicarbonate 650 MG tablet Take 2 tablets (1,300 mg total) by mouth 2 (two) times daily. 60 tablet 0   Vitamin D, Ergocalciferol, (DRISDOL) 1.25 MG (50000 UT) CAPS capsule Take 50,000 Units by mouth daily.     No current facility-administered medications for this visit.    Allergies:   Gemfibrozil, Lotemax [loteprednol etabonate], Statins, Doxycycline, Codeine, and Hydrocodone-acetaminophen    Social History:  The patient  reports that she has never smoked. She has never used smokeless tobacco. She reports that she does not drink alcohol and does not use drugs.   Family History:  The patient's family history includes Alzheimer's disease in her sister  and sister; Cancer in her brother and father; Heart disease in her mother; Stroke in her mother.    ROS:  Please see the history of present illness.   Otherwise, review of systems are positive for none.   All other systems are reviewed and negative.    PHYSICAL EXAM: VS:  BP 134/76 (BP Location: Right Arm, Patient Position: Sitting, Cuff Size: Normal)   Pulse 71   Ht 5' 4.5" (1.638 m)   Wt 127 lb (57.6 kg)   BMI 21.46 kg/m  ,  BMI Body mass index is 21.46 kg/m. GEN: Well nourished, well developed, in no acute distress.  HEENT: normal Neck: no JVD, carotid bruits, or masses Cardiac: RRR; radiated murmur from her AV fistula. no murmurs, rubs, or gallops,no edema. Functioning AV fistula in left upper arm. Respiratory:  clear to auscultation bilaterally, normal work of breathing GI: soft, nontender, nondistended, + BS MS: no deformity or atrophy Skin: warm and dry, no rash Neuro:  Strength and sensation are intact Psych: euthymic mood, full affect   EKG:  EKG is ordered today. The ekg ordered today demonstrates NSR rate 71. Borderline low voltage. I have personally reviewed and interpreted this study.    Recent Labs: 02/23/2021: ALT 70; BUN 65; Creatinine, Ser 4.46; Hemoglobin 10.7; Platelets 263; Potassium 4.3; Sodium 138    Lipid Panel No results found for: "CHOL", "TRIG", "HDL", "CHOLHDL", "VLDL", "LDLCALC", "LDLDIRECT"    Wt Readings from Last 3 Encounters:  08/02/21 127 lb (57.6 kg)  05/23/21 124 lb (56.2 kg)  02/23/21 125 lb (56.7 kg)      Other studies Reviewed: Additional studies/ records that were reviewed today include:   Echo 06/19/13: Study Conclusions   - Left ventricle: The cavity size was normal. There was    severe focal basal and moderate concentric hypertrophy.    Systolic function was normal. The estimated ejection    fraction was in the range of 60% to 65%. Wall motion was    normal; there were no regional wall motion abnormalities.  - Aortic valve:  Valve area: 1.69cm^2 (Vmax).  - Mitral valve: Mild to moderate regurgitation.  - Left atrium: The atrium was mildly dilated.  - Atrial septum: There was increased thickness of the    septum, consistent with lipomatous hypertrophy.    ASSESSMENT AND PLAN:  1.  ESRD on dialysis 2. Orthostatic hypotension. Off antihypertensive therapy. On midodrine on dialysis days. With constellation of findings need to consider possibility of systemic amyloid.  3. Bilateral carpel tunnel syndrome 4. DM 5. History of HTN  Will check Echo. If LV function is OK she will be able to proceed with planing surgical procedures. If she has features of amyloid will need to check SPEP and consider a PYP scan.   Current medicines are reviewed at length with the patient today.  The patient does not have concerns regarding medicines.  The following changes have been made:  no change  Labs/ tests ordered today include:   Orders Placed This Encounter  Procedures   Lipid panel   EKG 12-Lead   ECHOCARDIOGRAM COMPLETE         Disposition:   FU with me TBD   Signed, Asiah Befort Martinique, MD  08/02/2021 4:42 PM    [facility] [address] [place], [place], [address] Phone [phone], Fax [phone]

## 2021-08-01 DIAGNOSIS — N2581 Secondary hyperparathyroidism of renal origin: Secondary | ICD-10-CM | POA: Diagnosis not present

## 2021-08-01 DIAGNOSIS — N186 End stage renal disease: Secondary | ICD-10-CM | POA: Diagnosis not present

## 2021-08-01 DIAGNOSIS — Z992 Dependence on renal dialysis: Secondary | ICD-10-CM | POA: Diagnosis not present

## 2021-08-02 ENCOUNTER — Encounter: Payer: Self-pay | Admitting: Cardiology

## 2021-08-02 ENCOUNTER — Ambulatory Visit (INDEPENDENT_AMBULATORY_CARE_PROVIDER_SITE_OTHER): Payer: Medicare PPO | Admitting: Cardiology

## 2021-08-02 VITALS — BP 134/76 | HR 71 | Ht 64.5 in | Wt 127.0 lb

## 2021-08-02 DIAGNOSIS — N186 End stage renal disease: Secondary | ICD-10-CM | POA: Diagnosis not present

## 2021-08-02 DIAGNOSIS — E1122 Type 2 diabetes mellitus with diabetic chronic kidney disease: Secondary | ICD-10-CM

## 2021-08-02 DIAGNOSIS — N184 Chronic kidney disease, stage 4 (severe): Secondary | ICD-10-CM

## 2021-08-02 DIAGNOSIS — I951 Orthostatic hypotension: Secondary | ICD-10-CM

## 2021-08-02 NOTE — Patient Instructions (Signed)
Medication Instructions:  No changes   *If you need a refill on your cardiac medications before your next appointment, please call your pharmacy*   Lab Work: Lipid  If you have labs (blood work) drawn today and your tests are completely normal, you will receive your results only by: [place] (if you have MyChart) OR A paper copy in the mail If you have any lab test that is abnormal or we need to change your treatment, we will call you to review the results.   Testing/Procedures: Will be schedule at [facility] street office [place] or [place] has requested that you have an echocardiogram. Echocardiography is a painless test that uses sound waves to create images of your heart. It provides your doctor with information about the size and shape of your heart and how well your heart's chambers and valves are working. This procedure takes approximately one hour. There are no restrictions for this procedure.    Follow-Up: At [facility], you and your health needs are our priority.  As part of our continuing mission to provide you with exceptional heart care, we have created designated Provider Care Teams.  These Care Teams include your primary Cardiologist (physician) and Advanced Practice Providers (APPs -  Physician Assistants and Nurse Practitioners) who all work together to provide you with the care you need, when you need it.     Your next appointment:    6 month    The format for your next appointment:   In Person  Provider:   Peter Martinique, MD    Other Instructions   if  echo normal will clear you for surgery

## 2021-08-03 DIAGNOSIS — N2581 Secondary hyperparathyroidism of renal origin: Secondary | ICD-10-CM | POA: Diagnosis not present

## 2021-08-03 DIAGNOSIS — Z992 Dependence on renal dialysis: Secondary | ICD-10-CM | POA: Diagnosis not present

## 2021-08-03 DIAGNOSIS — N186 End stage renal disease: Secondary | ICD-10-CM | POA: Diagnosis not present

## 2021-08-03 NOTE — Patient Instructions (Addendum)
DUE TO COVID-19 ONLY TWO VISITORS  (aged 84 and older)  ARE ALLOWED TO COME WITH YOU AND STAY IN THE WAITING ROOM ONLY DURING PRE OP AND PROCEDURE.   **NO VISITORS ARE ALLOWED IN THE SHORT STAY AREA OR RECOVERY ROOM!!**  IF YOU WILL BE ADMITTED INTO THE HOSPITAL YOU ARE ALLOWED ONLY FOUR SUPPORT PEOPLE DURING VISITATION HOURS ONLY (7 AM -8PM)   The support person(s) must pass our screening, gel in and out, and wear a mask at all times, including in the patient's room. Patients must also wear a mask when staff or their support person are in the room. Visitors GUEST BADGE MUST BE WORN VISIBLY  One adult visitor may remain with you overnight and MUST be in the room by 8 P.M.     Your procedure is scheduled on: 08/22/21   Report to [facility] Main Entrance    Report to admitting at   6:45 AM   Call this number if you have problems the morning of surgery [phone]   Do not eat food or drink:After Midnight.              If you have questions, please contact your surgeon's office.      Oral Hygiene is also important to reduce your risk of infection.                                    Remember - BRUSH YOUR TEETH THE MORNING OF SURGERY WITH YOUR REGULAR TOOTHPASTE      Take these medicines the morning of surgery with A SIP OF WATER:  use eye drope as usual   How to Manage Your Diabetes Before and After Surgery  Why is it important to control my blood sugar before and after surgery? Improving blood sugar levels before and after surgery helps healing and can limit problems. A way of improving blood sugar control is eating a healthy diet by:  Eating less sugar and carbohydrates  Increasing activity/exercise  Talking with your doctor about reaching your blood sugar goals High blood sugars (greater than 180 mg/dL) can raise your risk of infections and slow your recovery, so you will need to focus on controlling your diabetes during the weeks before surgery. Make sure that  the doctor who takes care of your diabetes knows about your planned surgery including the date and location.  How do I manage my blood sugar before surgery? Check your blood sugar at least 4 times a day, starting 2 days before surgery, to make sure that the level is not too high or low. Check your blood sugar the morning of your surgery when you wake up and every 2 hours until you get to the Short Stay unit. If your blood sugar is less than 70 mg/dL, you will need to treat for low blood sugar: Do not take insulin. Treat a low blood sugar (less than 70 mg/dL) with  cup of clear juice (cranberry or apple), 4 glucose tablets, OR glucose gel. Recheck blood sugar in 15 minutes after treatment (to make sure it is greater than 70 mg/dL). If your blood sugar is not greater than 70 mg/dL on recheck, call [phone] for further instructions. Report your blood sugar to the short stay nurse when you get to Short Stay.  If you are admitted to the hospital after surgery: Your blood sugar will be checked by the staff and you will  probably be given insulin after surgery (instead of oral diabetes medicines) to make sure you have good blood sugar levels. The goal for blood sugar control after surgery is 80-180 mg/dL.   WHAT DO I DO ABOUT MY DIABETES MEDICATION?   Day of surgery: If your CBG is greater than 220 mg/dL, you may take  of your sliding scale  (correction) dose of insulin.   Otherwise no insulin. Call your diabetic Dr. If questions or concerns about medication doses                                 You may not have any metal on your body including hair pins, jewelry, and body piercing             Do not wear make-up, lotions, powders, perfumes or deodorant  Do not wear nail polish including gel and S&S, artificial/acrylic nails, or any other type of covering on natural nails including finger and toenails. If you have artificial nails, gel coating, etc. that needs to be removed by a nail  salon please have this removed prior to surgery or surgery may need to be canceled/ delayed if the surgeon/ anesthesia feels like they are unable to be safely monitored.   Do not shave  48 hours prior to surgery.     Do not bring valuables to the hospital. [place].   Contacts, dentures or bridgework may not be worn into surgery.   Bring small overnight bag day of surgery.   DO NOT [place]. PHARMACY WILL DISPENSE MEDICATIONS LISTED ON YOUR MEDICATION LIST TO YOU DURING YOUR ADMISSION [place]!       Special Instructions: Bring a copy of your healthcare power of attorney and living will documents  the day of surgery if you haven't scanned them before.              Please read over the following fact sheets you were given: IF YOU HAVE QUESTIONS ABOUT YOUR PRE-OP INSTRUCTIONS PLEASE CALL [phone]     [facility] - Preparing for Surgery Before surgery, you can play an important role.  Because skin is not sterile, your skin needs to be as free of germs as possible.  You can reduce the number of germs on your skin by washing with CHG (chlorahexidine gluconate) soap before surgery.  CHG is an antiseptic cleaner which kills germs and bonds with the skin to continue killing germs even after washing. Please DO NOT use if you have an allergy to CHG or antibacterial soaps.  If your skin becomes reddened/irritated stop using the CHG and inform your nurse when you arrive at Short Stay. Do not shave (including legs and underarms) for at least 48 hours prior to the first CHG shower.   Please follow these instructions carefully:  1.  Shower with CHG Soap the night before surgery and the  morning of Surgery.  2.  If you choose to wash your hair, wash your hair first as usual with your  normal  shampoo.  3.  After you shampoo, rinse your hair and body thoroughly to remove the  shampoo.  4.  Use CHG as you would any other liquid soap.  You can apply chg directly  to the skin and wash                       Gently with a scrungie or clean washcloth.  5.  Apply the CHG Soap to your body ONLY FROM THE NECK DOWN.   Do not use on face/ open                           Wound or open sores. Avoid contact with eyes, ears mouth and genitals (private parts).                       Wash face,  Genitals (private parts) with your normal soap.             6.  Wash thoroughly, paying special attention to the area where your surgery  will be performed.  7.  Thoroughly rinse your body with warm water from the neck down.  8.  DO NOT shower/wash with your normal soap after using and rinsing off  the CHG Soap.             9.  Pat yourself dry with a clean towel.            10.  Wear clean pajamas.            11.  Place clean sheets on your bed the night of your first shower and do not  sleep with pets. Day of Surgery : Do not apply any lotions/deodorants the morning of surgery.  Please wear clean clothes to the hospital/surgery center.  FAILURE TO FOLLOW THESE INSTRUCTIONS MAY RESULT IN THE CANCELLATION OF YOUR SURGERY      ________________________________________________________________________

## 2021-08-06 DIAGNOSIS — N186 End stage renal disease: Secondary | ICD-10-CM | POA: Diagnosis not present

## 2021-08-06 DIAGNOSIS — Z992 Dependence on renal dialysis: Secondary | ICD-10-CM | POA: Diagnosis not present

## 2021-08-06 DIAGNOSIS — N2581 Secondary hyperparathyroidism of renal origin: Secondary | ICD-10-CM | POA: Diagnosis not present

## 2021-08-08 ENCOUNTER — Ambulatory Visit (HOSPITAL_BASED_OUTPATIENT_CLINIC_OR_DEPARTMENT_OTHER)
Admission: RE | Admit: 2021-08-08 | Discharge: 2021-08-08 | Disposition: A | Discharge status: Home / Self Care | Payer: Medicare PPO | Source: Ambulatory Visit | Attending: Cardiology | Admitting: Cardiology

## 2021-08-08 DIAGNOSIS — N186 End stage renal disease: Secondary | ICD-10-CM | POA: Diagnosis not present

## 2021-08-08 DIAGNOSIS — N184 Chronic kidney disease, stage 4 (severe): Secondary | ICD-10-CM | POA: Diagnosis not present

## 2021-08-08 DIAGNOSIS — E1122 Type 2 diabetes mellitus with diabetic chronic kidney disease: Secondary | ICD-10-CM | POA: Diagnosis not present

## 2021-08-08 DIAGNOSIS — I951 Orthostatic hypotension: Secondary | ICD-10-CM | POA: Insufficient documentation

## 2021-08-08 DIAGNOSIS — R0602 Shortness of breath: Secondary | ICD-10-CM | POA: Diagnosis not present

## 2021-08-08 DIAGNOSIS — Z992 Dependence on renal dialysis: Secondary | ICD-10-CM | POA: Diagnosis not present

## 2021-08-08 DIAGNOSIS — N2581 Secondary hyperparathyroidism of renal origin: Secondary | ICD-10-CM | POA: Diagnosis not present

## 2021-08-08 LAB — ECHOCARDIOGRAM COMPLETE
AV Mean grad: 5 mmHg
AV Peak grad: 8.6 mmHg
Ao pk vel: 1.47 m/s
Area-P 1/2: 2.07 cm2
S' Lateral: 2.8 cm

## 2021-08-08 NOTE — Progress Notes (Signed)
  Echocardiogram 2D Echocardiogram has been performed.  [name] F 08/08/2021, 4:02 PM

## 2021-08-09 ENCOUNTER — Encounter (HOSPITAL_COMMUNITY): Payer: Self-pay

## 2021-08-09 ENCOUNTER — Other Ambulatory Visit: Payer: Self-pay

## 2021-08-09 ENCOUNTER — Encounter (HOSPITAL_COMMUNITY)
Admission: RE | Admit: 2021-08-09 | Discharge: 2021-08-09 | Disposition: A | Discharge status: Home / Self Care | Payer: Medicare PPO | Source: Ambulatory Visit | Attending: Interventional Radiology | Admitting: Interventional Radiology

## 2021-08-09 DIAGNOSIS — I951 Orthostatic hypotension: Secondary | ICD-10-CM | POA: Diagnosis not present

## 2021-08-09 DIAGNOSIS — E1122 Type 2 diabetes mellitus with diabetic chronic kidney disease: Secondary | ICD-10-CM | POA: Diagnosis not present

## 2021-08-09 DIAGNOSIS — N186 End stage renal disease: Secondary | ICD-10-CM | POA: Diagnosis not present

## 2021-08-09 DIAGNOSIS — N184 Chronic kidney disease, stage 4 (severe): Secondary | ICD-10-CM | POA: Diagnosis not present

## 2021-08-09 HISTORY — DX: Dependence on renal dialysis: Z99.2

## 2021-08-09 LAB — LIPID PANEL
Chol/HDL Ratio: 3.1 ratio (ref 0.0–4.4)
Cholesterol, Total: 133 mg/dL (ref 100–199)
HDL: 43 mg/dL (ref >39)
LDL Chol Calc (NIH): 68 mg/dL (ref 0–99)
Triglycerides: 126 mg/dL (ref 0–149)
VLDL Cholesterol Cal: 22 mg/dL (ref 5–40)

## 2021-08-09 NOTE — Progress Notes (Addendum)
Pt missed her PAT appointment . I called and left a message  Pt called back and interview was done by phone Pt will come by [place] long of 6/30 to pick up instructions and soap from volunteer desk at main waiting.

## 2021-08-09 NOTE — Progress Notes (Signed)
Anesthesia note:  Bowel prep reminder:NA  PCP - Dr. Deirdre Evener Cardiologist -Dr. P. Martinique Other-   Chest x-ray -  EKG - 08/02/21-epic Stress Test - no ECHO - 08/08/21-epic Cardiac Cath - NA  Pacemaker/ICD device last checked:NA  Sleep Study - yes CPAP - no. Pt can't tolerate the mask   Pancreas was removed Fasting Blood Sugar - 106-170 Checks Blood Sugar continuous with a libre on the Rt arm.  AV graft on Left arm for dialysis M-W-F_____  Blood Thinner:NA Blood Thinner Instructions: Aspirin Instructions: Last Dose:  Anesthesia review: yes  Patient denies shortness of breath, fever, cough and chest pain at PAT appointment Pt has no SOB with her activity level.   Patient verbalized understanding of instructions that were given to them at the PAT appointment. Patient was also instructed that they will need to review over the PAT instructions again at home before surgery. Daughter was listening to the phone call. The Pt will pick up the bag with instructions and soap on 08/17/21 because they are going to the beach.

## 2021-08-10 DIAGNOSIS — Z992 Dependence on renal dialysis: Secondary | ICD-10-CM | POA: Diagnosis not present

## 2021-08-10 DIAGNOSIS — N2581 Secondary hyperparathyroidism of renal origin: Secondary | ICD-10-CM | POA: Diagnosis not present

## 2021-08-10 DIAGNOSIS — N186 End stage renal disease: Secondary | ICD-10-CM | POA: Diagnosis not present

## 2021-08-14 ENCOUNTER — Other Ambulatory Visit: Payer: Self-pay | Admitting: Interventional Radiology

## 2021-08-14 DIAGNOSIS — N2889 Other specified disorders of kidney and ureter: Secondary | ICD-10-CM

## 2021-08-14 DIAGNOSIS — Z992 Dependence on renal dialysis: Secondary | ICD-10-CM | POA: Diagnosis not present

## 2021-08-14 DIAGNOSIS — N186 End stage renal disease: Secondary | ICD-10-CM | POA: Diagnosis not present

## 2021-08-15 ENCOUNTER — Other Ambulatory Visit (HOSPITAL_COMMUNITY): Payer: Medicare PPO

## 2021-08-16 DIAGNOSIS — N186 End stage renal disease: Secondary | ICD-10-CM | POA: Diagnosis not present

## 2021-08-16 DIAGNOSIS — Z992 Dependence on renal dialysis: Secondary | ICD-10-CM | POA: Diagnosis not present

## 2021-08-16 NOTE — Progress Notes (Signed)
Anesthesia Chart Review   Case: [id number] Date/Time: 08/22/21 1015   Procedure: MICROWAVE ABLATION (Left)   Anesthesia type: General   Pre-op diagnosis: LEFT RENAL LESION   [facility]   Surgeons: Sandi Mariscal, MD       DISCUSSION:86 y.o. never smoker with h/o PONV, HTN, hyperparathyroidism, sleep apnea, DM II, CKD on dialysis MWF, left renal lesion scheduled for above procedure 08/22/2021 with Dr. Sandi Mariscal.   Pt seen by cardiology 08/02/2021 for preoperative evaluation.  Per OV note, "Will check Echo. If LV function is OK she will be able to proceed with planing surgical procedures. If she has features of amyloid will need to check SPEP and consider a PYP scan."  Per 08/08/21 Echo results, "This study demonstrates:  Echo looks very good. Normal LV function. No significant valvular disease. There is no evidence of amyloid by Echo Medication changes / Follow up studies / Other recommendations:   She can proceed with planned carpel tunnel surgery. No further cardiac work up needed."  Pt has dialysis MWF, procedure is currently scheduled for same day as surgery.  Discussed with IR PA.  Ideally, dialysis should be the day before surgery.  If not will evaluate labs DOS and proceed if within acceptable range.  Anesthesia evaluate DOS.  VS: Ht '5\' 2"'$  (1.575 m)   Wt 56.2 kg   BMI 22.68 kg/m   PROVIDERS: [name], MD is PCP  Cardiologist:   Peter Martinique, MD   LABS:  labs DOS (all labs ordered are listed, but only abnormal results are displayed)  Labs Reviewed - No data to display   IMAGES:   EKG: 08/02/2021 Rate 71 bpm  NSR  CV: Echo 08/08/21 1. GLS was not performed      . Left ventricular ejection fraction, by estimation, is 60 to 65%. The  left ventricle has normal function. The left ventricle has no regional  wall motion abnormalities. There is mild concentric left ventricular  hypertrophy. Left ventricular diastolic   parameters are consistent with Grade  I diastolic dysfunction (impaired  relaxation).   2. Right ventricular systolic function is normal. The right ventricular  size is normal.   3. Left atrial size was mildly dilated.   4. The mitral valve is normal in structure. Mild mitral valve  regurgitation. No evidence of mitral stenosis. Moderate mitral annular  calcification.   5. The aortic valve is tricuspid. Aortic valve regurgitation is not  visualized. No aortic stenosis is present.   6. The inferior vena cava is normal in size with greater than 50%  respiratory variability, suggesting right atrial pressure of 3 mmHg. Past Medical History:  Diagnosis Date   Anemia of chronic disease    takes iron   Anxiety    Blood transfusion without reported diagnosis    Bradycardia    CKD (chronic kidney disease) stage 3, GFR 30-59 ml/min [facility])    sees dr Arty Baumgartner sees next in jan 2018   Diabetes mellitus without complication ([place])    became diabetic after Whipple procedure   Dialysis patient [facility])    M-W-F AV graft left arm   Diarrhea    Dysrhythmia 04/16/2016   bradycardia due to medication    GERD (gastroesophageal reflux disease)    Gout    Headache    History of kidney stones 06/2013   Hyperparathyroidism ([place])    Hypertension    PONV (postoperative nausea and vomiting)    Sleep apnea  no cpap machine. could not tolerate   Stroke ([place]) 04/16/2016   TIA 1995   Vitamin D deficiency     Past Surgical History:  Procedure Laterality Date   ABDOMINAL HYSTERECTOMY  1985   complete   BACK SURGERY  4097   lower   BASCILIC VEIN TRANSPOSITION Left 04/24/2017   Procedure: LEFT ARM FIRST STAGE BASILIC VEIN TRANSPOSITION;  Surgeon: Serafina Mitchell, MD;  Location: [place];  Service: Vascular;  Laterality: Left;   [place] Left 07/10/2017   Procedure: SECOND STAGE BASILIC VEIN TRANSPOSITION LEFT ARM;  Surgeon: Serafina Mitchell, MD;  [facility];  Service: Vascular;  Laterality: Left;   BREAST LUMPECTOMY  Left x 2   many years apart, benign   CHOLECYSTECTOMY  1985   CYSTOSCOPY WITH URETEROSCOPY AND STENT PLACEMENT Left 06/18/2013   Procedure: CYSTOSCOPY WITH Lef URETEROSCOPY AND Left STENT PLACEMENT;  Surgeon: Dutch Gray, MD;  [facility];  Service: Urology;  Laterality: Left;   ESOPHAGOGASTRODUODENOSCOPY (EGD) WITH PROPOFOL N/A 11/13/2016   Procedure: ESOPHAGOGASTRODUODENOSCOPY (EGD) WITH PROPOFOL;  Surgeon: Otis Brace, MD;  Location: [place];  Service: Gastroenterology;  Laterality: N/A;   EUS N/A 02/07/2016   Procedure: ESOPHAGEAL ENDOSCOPIC ULTRASOUND (EUS) RADIAL;  Surgeon: Arta Silence, MD;  [facility];  Service: Endoscopy;  Laterality: N/A;   EYE SURGERY Bilateral 2014   ioc for catracts    FOOT SURGERY Left 1990   something with toes unsure what    HERNIA REPAIR  2018   IR RADIOLOGIST EVAL & MGMT  05/18/2021   PARATHYROID EXPLORATION     WHIPPLE PROCEDURE N/A 04/23/2016   Procedure: WHIPPLE PROCEDURE;  Surgeon: Stark Klein, MD;  [facility];  Service: General;  Laterality: N/A;    MEDICATIONS:  acetaminophen (TYLENOL 8 HOUR ARTHRITIS PAIN) 650 MG CR tablet   Biotin 10000 MCG TABS   colestipol (COLESTID) 1 g tablet   diclofenac sodium (VOLTAREN) 1 % GEL   escitalopram (LEXAPRO) 20 MG tablet   insulin aspart (NOVOLOG FLEXPEN) 100 UNIT/ML FlexPen   lidocaine-prilocaine (EMLA) cream   Lifitegrast 5 % SOLN   lipase/protease/amylase (CREON) 36000 UNITS CPEP capsule   midodrine (PROAMATINE) 10 MG tablet   Multiple Vitamins-Minerals (PRESERVISION AREDS 2+MULTI VIT PO)   Propylene Glycol 0.6 % SOLN   sevelamer carbonate (RENVELA) 800 MG tablet   No current facility-administered medications for this encounter.    Konrad Felix Ward, PA-C [facility] [phone]

## 2021-08-17 DIAGNOSIS — N186 End stage renal disease: Secondary | ICD-10-CM | POA: Diagnosis not present

## 2021-08-17 DIAGNOSIS — Z992 Dependence on renal dialysis: Secondary | ICD-10-CM | POA: Diagnosis not present

## 2021-08-17 DIAGNOSIS — I158 Other secondary hypertension: Secondary | ICD-10-CM | POA: Diagnosis not present

## 2021-08-18 DIAGNOSIS — N186 End stage renal disease: Secondary | ICD-10-CM | POA: Diagnosis not present

## 2021-08-18 DIAGNOSIS — N2581 Secondary hyperparathyroidism of renal origin: Secondary | ICD-10-CM | POA: Diagnosis not present

## 2021-08-18 DIAGNOSIS — Z992 Dependence on renal dialysis: Secondary | ICD-10-CM | POA: Diagnosis not present

## 2021-08-20 ENCOUNTER — Other Ambulatory Visit: Payer: Self-pay | Admitting: Radiology

## 2021-08-20 ENCOUNTER — Other Ambulatory Visit: Payer: Self-pay | Admitting: Student

## 2021-08-20 DIAGNOSIS — N2581 Secondary hyperparathyroidism of renal origin: Secondary | ICD-10-CM | POA: Diagnosis not present

## 2021-08-20 DIAGNOSIS — N2889 Other specified disorders of kidney and ureter: Secondary | ICD-10-CM

## 2021-08-20 DIAGNOSIS — Z992 Dependence on renal dialysis: Secondary | ICD-10-CM | POA: Diagnosis not present

## 2021-08-20 DIAGNOSIS — E1165 Type 2 diabetes mellitus with hyperglycemia: Secondary | ICD-10-CM | POA: Diagnosis not present

## 2021-08-20 DIAGNOSIS — N186 End stage renal disease: Secondary | ICD-10-CM | POA: Diagnosis not present

## 2021-08-21 ENCOUNTER — Encounter (HOSPITAL_COMMUNITY): Payer: Self-pay | Admitting: Interventional Radiology

## 2021-08-21 DIAGNOSIS — Z992 Dependence on renal dialysis: Secondary | ICD-10-CM | POA: Diagnosis not present

## 2021-08-21 DIAGNOSIS — N2581 Secondary hyperparathyroidism of renal origin: Secondary | ICD-10-CM | POA: Diagnosis not present

## 2021-08-21 DIAGNOSIS — N186 End stage renal disease: Secondary | ICD-10-CM | POA: Diagnosis not present

## 2021-08-21 NOTE — Anesthesia Preprocedure Evaluation (Addendum)
Anesthesia Evaluation  Patient identified by MRN, date of birth, ID band Patient awake    Reviewed: Allergy & Precautions, NPO status , Patient's Chart, lab work & pertinent test results  History of Anesthesia Complications (+) PONV and history of anesthetic complications  Airway Mallampati: II  TM Distance: >3 FB Neck ROM: Full    Dental  (+) Chipped, Dental Advisory Given,    Pulmonary sleep apnea (no CPAP) ,    Pulmonary exam normal breath sounds clear to auscultation       Cardiovascular hypertension, Normal cardiovascular exam Rhythm:Regular Rate:Normal  TTE 2023 1. GLS was not performed   . Left ventricular ejection fraction, by estimation, is 60 to 65%. The  left ventricle has normal function. The left ventricle has no regional  wall motion abnormalities. There is mild concentric left ventricular  hypertrophy. Left ventricular diastolic  parameters are consistent with Grade I diastolic dysfunction (impaired  relaxation).  2. Right ventricular systolic function is normal. The right ventricular  size is normal.  3. Left atrial size was mildly dilated.  4. The mitral valve is normal in structure. Mild mitral valve  regurgitation. No evidence of mitral stenosis. Moderate mitral annular  calcification.  5. The aortic valve is tricuspid. Aortic valve regurgitation is not  visualized. No aortic stenosis is present.  6. The inferior vena cava is normal in size with greater than 50%  respiratory variability, suggesting right atrial pressure of 3 mmHg.    Neuro/Psych  Headaches, PSYCHIATRIC DISORDERS Anxiety CVA, No Residual Symptoms    GI/Hepatic Neg liver ROS, GERD  ,  Endo/Other  diabetes, Type 2, Insulin Dependent  Renal/GU Dialysis and ESRFRenal disease (MWF, last dialysis Tues)  negative genitourinary   Musculoskeletal negative musculoskeletal ROS (+)   Abdominal   Peds  Hematology negative  hematology ROS (+)   Anesthesia Other Findings Left renal mass  Reproductive/Obstetrics                            Anesthesia Physical Anesthesia Plan  ASA: 3  Anesthesia Plan: General   Post-op Pain Management: Minimal or no pain anticipated   Induction: Intravenous  PONV Risk Score and Plan: 3 and Dexamethasone, Ondansetron and Treatment may vary due to age or medical condition  Airway Management Planned: Oral ETT  Additional Equipment:   Intra-op Plan:   Post-operative Plan: Extubation in OR  Informed Consent: I have reviewed the patients History and Physical, chart, labs and discussed the procedure including the risks, benefits and alternatives for the proposed anesthesia with the patient or authorized representative who has indicated his/her understanding and acceptance.     Dental advisory given  Plan Discussed with: CRNA  Anesthesia Plan Comments:         Anesthesia Quick Evaluation

## 2021-08-22 ENCOUNTER — Ambulatory Visit (HOSPITAL_COMMUNITY)
Admission: RE | Admit: 2021-08-22 | Discharge: 2021-08-22 | Disposition: A | Discharge status: Home / Self Care | Payer: Medicare PPO | Source: Ambulatory Visit | Attending: Interventional Radiology | Admitting: Interventional Radiology

## 2021-08-22 ENCOUNTER — Ambulatory Visit (HOSPITAL_COMMUNITY)
Admission: RE | Admit: 2021-08-22 | Discharge: 2021-08-22 | Disposition: A | Discharge status: Home / Self Care | Payer: Medicare PPO | Source: Ambulatory Visit | Attending: Diagnostic Radiology | Admitting: Diagnostic Radiology

## 2021-08-22 ENCOUNTER — Encounter (HOSPITAL_COMMUNITY)
Admission: RE | Disposition: A | Discharge status: Home / Self Care | Payer: Self-pay | Source: Home / Self Care | Attending: Interventional Radiology

## 2021-08-22 ENCOUNTER — Observation Stay (HOSPITAL_COMMUNITY)
Admission: RE | Admit: 2021-08-22 | Discharge: 2021-08-23 | Disposition: A | Discharge status: Home / Self Care | Payer: Medicare PPO | Attending: Interventional Radiology | Admitting: Interventional Radiology

## 2021-08-22 ENCOUNTER — Encounter (HOSPITAL_COMMUNITY): Payer: Self-pay | Admitting: Interventional Radiology

## 2021-08-22 ENCOUNTER — Other Ambulatory Visit: Payer: Self-pay

## 2021-08-22 ENCOUNTER — Ambulatory Visit (HOSPITAL_BASED_OUTPATIENT_CLINIC_OR_DEPARTMENT_OTHER): Payer: Medicare PPO | Admitting: Anesthesiology

## 2021-08-22 ENCOUNTER — Ambulatory Visit (HOSPITAL_COMMUNITY): Payer: Medicare PPO | Admitting: Physician Assistant

## 2021-08-22 DIAGNOSIS — Z8673 Personal history of transient ischemic attack (TIA), and cerebral infarction without residual deficits: Secondary | ICD-10-CM | POA: Insufficient documentation

## 2021-08-22 DIAGNOSIS — E1122 Type 2 diabetes mellitus with diabetic chronic kidney disease: Secondary | ICD-10-CM | POA: Insufficient documentation

## 2021-08-22 DIAGNOSIS — Z992 Dependence on renal dialysis: Secondary | ICD-10-CM | POA: Insufficient documentation

## 2021-08-22 DIAGNOSIS — C642 Malignant neoplasm of left kidney, except renal pelvis: Principal | ICD-10-CM | POA: Diagnosis present

## 2021-08-22 DIAGNOSIS — N2889 Other specified disorders of kidney and ureter: Secondary | ICD-10-CM

## 2021-08-22 DIAGNOSIS — Z79899 Other long term (current) drug therapy: Secondary | ICD-10-CM | POA: Insufficient documentation

## 2021-08-22 DIAGNOSIS — I120 Hypertensive chronic kidney disease with stage 5 chronic kidney disease or end stage renal disease: Secondary | ICD-10-CM | POA: Insufficient documentation

## 2021-08-22 DIAGNOSIS — N186 End stage renal disease: Secondary | ICD-10-CM | POA: Insufficient documentation

## 2021-08-22 DIAGNOSIS — Z794 Long term (current) use of insulin: Secondary | ICD-10-CM

## 2021-08-22 DIAGNOSIS — C220 Liver cell carcinoma: Secondary | ICD-10-CM | POA: Diagnosis not present

## 2021-08-22 DIAGNOSIS — N289 Disorder of kidney and ureter, unspecified: Secondary | ICD-10-CM | POA: Diagnosis not present

## 2021-08-22 HISTORY — PX: IR RENAL SUPRASEL UNI S&I MOD SED: IMG655

## 2021-08-22 HISTORY — PX: IR EMBO TUMOR ORGAN ISCHEMIA INFARCT INC GUIDE ROADMAPPING: IMG5449

## 2021-08-22 HISTORY — PX: IR US GUIDE VASC ACCESS RIGHT: IMG2390

## 2021-08-22 HISTORY — PX: RADIOLOGY WITH ANESTHESIA: SHX6223

## 2021-08-22 LAB — PROTIME-INR
INR: 0.9 (ref 0.8–1.2)
Prothrombin Time: 12.2 seconds (ref 11.4–15.2)

## 2021-08-22 LAB — GLUCOSE, CAPILLARY
Glucose-Capillary: 169 mg/dL — ABNORMAL HIGH (ref 70–99)
Glucose-Capillary: 181 mg/dL — ABNORMAL HIGH (ref 70–99)
Glucose-Capillary: 162 mg/dL — ABNORMAL HIGH (ref 70–99)

## 2021-08-22 LAB — BASIC METABOLIC PANEL
Anion gap: 11 (ref 5–15)
BUN: 31 mg/dL — ABNORMAL HIGH (ref 8–23)
CO2: 29 mmol/L (ref 22–32)
Calcium: 9.1 mg/dL (ref 8.9–10.3)
Chloride: 98 mmol/L (ref 98–111)
Creatinine, Ser: 3.03 mg/dL — ABNORMAL HIGH (ref 0.44–1.00)
GFR, Estimated: 15 mL/min — ABNORMAL LOW (ref >60)
Glucose, Bld: 148 mg/dL — ABNORMAL HIGH (ref 70–99)
Potassium: 3.8 mmol/L (ref 3.5–5.1)
Sodium: 138 mmol/L (ref 135–145)

## 2021-08-22 LAB — CBC WITH DIFFERENTIAL/PLATELET
Abs Immature Granulocytes: 0.02 10*3/uL (ref 0.00–0.07)
Basophils Absolute: 0 10*3/uL (ref 0.0–0.1)
Basophils Relative: 1 %
Eosinophils Absolute: 0.4 10*3/uL (ref 0.0–0.5)
Eosinophils Relative: 5 %
HCT: 38.5 % (ref 36.0–46.0)
Hemoglobin: 11.9 g/dL — ABNORMAL LOW (ref 12.0–15.0)
Immature Granulocytes: 0 %
Lymphocytes Relative: 17 %
Lymphs Abs: 1.2 10*3/uL (ref 0.7–4.0)
MCH: 28.7 pg (ref 26.0–34.0)
MCHC: 30.9 g/dL (ref 30.0–36.0)
MCV: 92.8 fL (ref 80.0–100.0)
Monocytes Absolute: 0.4 10*3/uL (ref 0.1–1.0)
Monocytes Relative: 6 %
Neutro Abs: 5.1 10*3/uL (ref 1.7–7.7)
Neutrophils Relative %: 71 %
Platelets: 227 10*3/uL (ref 150–400)
RBC: 4.15 MIL/uL (ref 3.87–5.11)
RDW: 15.4 % (ref 11.5–15.5)
WBC: 7.1 10*3/uL (ref 4.0–10.5)
nRBC: 0 % (ref 0.0–0.2)

## 2021-08-22 LAB — TYPE AND SCREEN
ABO/RH(D): A POS
Antibody Screen: NEGATIVE

## 2021-08-22 LAB — HEMOGLOBIN A1C
Hgb A1c MFr Bld: 5.6 % (ref 4.8–5.6)
Mean Plasma Glucose: 114.02 mg/dL

## 2021-08-22 SURGERY — IR WITH ANESTHESIA
Anesthesia: General | Laterality: Left

## 2021-08-22 MED ORDER — PROPOFOL 10 MG/ML IV BOLUS
INTRAVENOUS | Status: DC | PRN
  Administered 2021-08-22: 70 mg via INTRAVENOUS

## 2021-08-22 MED ORDER — DIPHENHYDRAMINE HCL 50 MG/ML IJ SOLN: 12.5000 mg | Freq: Four times a day (QID) | INTRAMUSCULAR | Status: DC | PRN

## 2021-08-22 MED ORDER — MIDAZOLAM HCL 2 MG/2ML IJ SOLN
INTRAMUSCULAR | Status: AC
  Filled 2021-08-22: qty 2

## 2021-08-22 MED ORDER — SODIUM CHLORIDE 0.9 % IV SOLN
INTRAVENOUS | Status: AC
  Filled 2021-08-22: qty 20

## 2021-08-22 MED ORDER — FENTANYL CITRATE (PF) 100 MCG/2ML IJ SOLN
INTRAMUSCULAR | Status: DC | PRN
  Administered 2021-08-22: 100 ug via INTRAVENOUS
  Administered 2021-08-22: 50 ug via INTRAVENOUS

## 2021-08-22 MED ORDER — DIPHENHYDRAMINE HCL 12.5 MG/5ML PO ELIX: 12.5000 mg | ORAL_SOLUTION | Freq: Four times a day (QID) | ORAL | Status: DC | PRN

## 2021-08-22 MED ORDER — PHENYLEPHRINE HCL-NACL 20-0.9 MG/250ML-% IV SOLN
INTRAVENOUS | Status: DC | PRN
  Administered 2021-08-22: 30 ug/min via INTRAVENOUS

## 2021-08-22 MED ORDER — ONDANSETRON HCL 4 MG/2ML IJ SOLN
4.0000 mg | Freq: Four times a day (QID) | INTRAMUSCULAR | Status: DC | PRN
Start: 2021-08-22 — End: 2021-08-23

## 2021-08-22 MED ORDER — SODIUM CHLORIDE 0.9 % IV SOLN
INTRAVENOUS | Status: AC
  Filled 2021-08-22: qty 1000

## 2021-08-22 MED ORDER — SODIUM CHLORIDE 0.9 % IV SOLN: INTRAVENOUS | Status: DC

## 2021-08-22 MED ORDER — FENTANYL CITRATE (PF) 250 MCG/5ML IJ SOLN
INTRAMUSCULAR | Status: AC
  Filled 2021-08-22: qty 5

## 2021-08-22 MED ORDER — ORAL CARE MOUTH RINSE: 15.0000 mL | Freq: Once | OROMUCOSAL | Status: AC

## 2021-08-22 MED ORDER — NALOXONE HCL 0.4 MG/ML IJ SOLN: 0.4000 mg | INTRAMUSCULAR | Status: DC | PRN

## 2021-08-22 MED ORDER — DOCUSATE SODIUM 100 MG PO CAPS
100.0000 mg | ORAL_CAPSULE | Freq: Two times a day (BID) | ORAL | Status: DC
  Administered 2021-08-22: 100 mg via ORAL
  Filled 2021-08-22 (×2): qty 1

## 2021-08-22 MED ORDER — PHENYLEPHRINE HCL-NACL 20-0.9 MG/250ML-% IV SOLN
INTRAVENOUS | Status: AC
  Filled 2021-08-22: qty 250

## 2021-08-22 MED ORDER — PANCRELIPASE (LIP-PROT-AMYL) 12000-38000 UNITS PO CPEP: 36000.0000 [IU] | ORAL_CAPSULE | Freq: Three times a day (TID) | ORAL | Status: DC | PRN

## 2021-08-22 MED ORDER — MIDAZOLAM HCL 5 MG/5ML IJ SOLN
INTRAMUSCULAR | Status: DC | PRN
  Administered 2021-08-22: .5 mg via INTRAVENOUS

## 2021-08-22 MED ORDER — SODIUM CHLORIDE 0.9% FLUSH: 9.0000 mL | INTRAVENOUS | Status: DC | PRN

## 2021-08-22 MED ORDER — INSULIN ASPART 100 UNIT/ML IJ SOLN
10.0000 [IU] | Freq: Three times a day (TID) | INTRAMUSCULAR | Status: DC
  Filled 2021-08-22: qty 0.1

## 2021-08-22 MED ORDER — FENTANYL CITRATE (PF) 100 MCG/2ML IJ SOLN
INTRAMUSCULAR | Status: AC
  Filled 2021-08-22: qty 2

## 2021-08-22 MED ORDER — EPHEDRINE SULFATE-NACL 50-0.9 MG/10ML-% IV SOSY
PREFILLED_SYRINGE | INTRAVENOUS | Status: DC | PRN
  Administered 2021-08-22: 10 mg via INTRAVENOUS

## 2021-08-22 MED ORDER — LACTATED RINGERS IV SOLN: INTRAVENOUS | Status: DC

## 2021-08-22 MED ORDER — SODIUM CHLORIDE 0.9 % IV SOLN
2.0000 g | INTRAVENOUS | Status: AC
  Administered 2021-08-22: 2 g via INTRAVENOUS
  Filled 2021-08-22: qty 2

## 2021-08-22 MED ORDER — MIDODRINE HCL 5 MG PO TABS: 10.0000 mg | ORAL_TABLET | ORAL | Status: DC

## 2021-08-22 MED ORDER — PANCRELIPASE (LIP-PROT-AMYL) 12000-38000 UNITS PO CPEP
72000.0000 [IU] | ORAL_CAPSULE | Freq: Three times a day (TID) | ORAL | Status: DC
  Administered 2021-08-22 – 2021-08-23 (×3): 72000 [IU] via ORAL
  Filled 2021-08-22 (×3): qty 6

## 2021-08-22 MED ORDER — CHLORHEXIDINE GLUCONATE 0.12 % MT SOLN
15.0000 mL | Freq: Once | OROMUCOSAL | Status: AC
  Administered 2021-08-22: 15 mL via OROMUCOSAL
  Filled 2021-08-22: qty 15

## 2021-08-22 MED ORDER — FENTANYL CITRATE PF 50 MCG/ML IJ SOSY
PREFILLED_SYRINGE | INTRAMUSCULAR | Status: AC
  Administered 2021-08-22: 25 ug via INTRAVENOUS
  Filled 2021-08-22: qty 3

## 2021-08-22 MED ORDER — FENTANYL CITRATE PF 50 MCG/ML IJ SOSY
25.0000 ug | PREFILLED_SYRINGE | INTRAMUSCULAR | Status: DC | PRN
  Administered 2021-08-22 (×3): 25 ug via INTRAVENOUS

## 2021-08-22 MED ORDER — IOHEXOL 300 MG/ML  SOLN
100.0000 mL | Freq: Once | INTRAMUSCULAR | Status: AC | PRN
  Administered 2021-08-22: 50 mL via INTRA_ARTERIAL

## 2021-08-22 MED ORDER — SEVELAMER CARBONATE 800 MG PO TABS
1600.0000 mg | ORAL_TABLET | Freq: Three times a day (TID) | ORAL | Status: DC
  Administered 2021-08-22 – 2021-08-23 (×3): 1600 mg via ORAL
  Filled 2021-08-22 (×4): qty 2

## 2021-08-22 MED ORDER — DEXAMETHASONE SODIUM PHOSPHATE 10 MG/ML IJ SOLN
INTRAMUSCULAR | Status: DC | PRN
  Administered 2021-08-22: 4 mg via INTRAVENOUS

## 2021-08-22 MED ORDER — COLESTIPOL HCL 1 G PO TABS
1.0000 g | ORAL_TABLET | Freq: Two times a day (BID) | ORAL | Status: DC | PRN
Start: 2021-08-22 — End: 2021-08-23

## 2021-08-22 MED ORDER — LIDOCAINE 2% (20 MG/ML) 5 ML SYRINGE
INTRAMUSCULAR | Status: DC | PRN
  Administered 2021-08-22: 50 mg via INTRAVENOUS

## 2021-08-22 MED ORDER — SUGAMMADEX SODIUM 200 MG/2ML IV SOLN
INTRAVENOUS | Status: DC | PRN
  Administered 2021-08-22: 150 mg via INTRAVENOUS

## 2021-08-22 MED ORDER — LIDOCAINE HCL 1 % IJ SOLN
INTRAMUSCULAR | Status: AC
  Filled 2021-08-22: qty 20

## 2021-08-22 MED ORDER — IOHEXOL 300 MG/ML  SOLN
100.0000 mL | Freq: Once | INTRAMUSCULAR | Status: AC | PRN
  Administered 2021-08-22: 20 mL via INTRA_ARTERIAL

## 2021-08-22 MED ORDER — BIOTIN 10000 MCG PO TABS: 20000.0000 ug | ORAL_TABLET | Freq: Two times a day (BID) | ORAL | Status: DC

## 2021-08-22 MED ORDER — ONDANSETRON HCL 4 MG/2ML IJ SOLN
INTRAMUSCULAR | Status: DC | PRN
  Administered 2021-08-22: 4 mg via INTRAVENOUS

## 2021-08-22 MED ORDER — ONDANSETRON HCL 4 MG/2ML IJ SOLN: 4.0000 mg | Freq: Four times a day (QID) | INTRAMUSCULAR | Status: DC | PRN

## 2021-08-22 MED ORDER — HYDROMORPHONE 1 MG/ML IV SOLN
INTRAVENOUS | Status: DC
  Administered 2021-08-22: 30 mg via INTRAVENOUS
  Administered 2021-08-22: 1.4 mg via INTRAVENOUS
  Administered 2021-08-23: 1.5 mg via INTRAVENOUS
  Administered 2021-08-23: 0.3 mg via INTRAVENOUS
  Administered 2021-08-23: 2.7 mg via INTRAVENOUS
  Filled 2021-08-22: qty 30

## 2021-08-22 MED ORDER — ROCURONIUM BROMIDE 10 MG/ML (PF) SYRINGE
PREFILLED_SYRINGE | INTRAVENOUS | Status: DC | PRN
  Administered 2021-08-22: 60 mg via INTRAVENOUS

## 2021-08-22 MED ORDER — PHENYLEPHRINE 80 MCG/ML (10ML) SYRINGE FOR IV PUSH (FOR BLOOD PRESSURE SUPPORT)
PREFILLED_SYRINGE | INTRAVENOUS | Status: DC | PRN
  Administered 2021-08-22 (×4): 80 ug via INTRAVENOUS
  Administered 2021-08-22 (×2): 160 ug via INTRAVENOUS
  Administered 2021-08-22: 80 ug via INTRAVENOUS

## 2021-08-22 MED ORDER — ESCITALOPRAM OXALATE 20 MG PO TABS
20.0000 mg | ORAL_TABLET | Freq: Every day | ORAL | Status: DC
  Administered 2021-08-22: 20 mg via ORAL
  Filled 2021-08-22: qty 1

## 2021-08-22 NOTE — H&P (Addendum)
Chief Complaint: Patient was seen in consultation today for left renal mass at the request of Elmarie Mainland   Supervising Physician: Sandi Mariscal  Patient Status: [facility] - Out-pt  History of Present Illness: Kim Burns is a 86 y.o. female with PMH of post-operative nausea & vomiting, stroke, diabetes, bradycardia, anemia of chronic disease, and end-stage renal disease on dialysis since January 2023 who is being seen today for left renal bland embolization and microwave ablation.  Past Medical History:  Diagnosis Date   Anemia of chronic disease    takes iron   Anxiety    Blood transfusion without reported diagnosis    Bradycardia    CKD (chronic kidney disease) stage 3, GFR 30-59 ml/min [facility])    sees dr Arty Baumgartner sees next in jan 2018   Diabetes mellitus without complication ([place])    became diabetic after Whipple procedure   Dialysis patient [facility])    M-W-F AV graft left arm   Diarrhea    Dysrhythmia 04/16/2016   bradycardia due to medication    GERD (gastroesophageal reflux disease)    Gout    Headache    History of kidney stones 06/2013   Hyperparathyroidism ([place])    Hypertension    PONV (postoperative nausea and vomiting)    Sleep apnea    no cpap machine. could not tolerate   Stroke ([place]) 04/16/2016   TIA 1995   Vitamin D deficiency     Past Surgical History:  Procedure Laterality Date   ABDOMINAL HYSTERECTOMY  1985   complete   BACK SURGERY  9563   lower   BASCILIC VEIN TRANSPOSITION Left 04/24/2017   Procedure: LEFT ARM FIRST STAGE BASILIC VEIN TRANSPOSITION;  Surgeon: Serafina Mitchell, MD;  Location: [place];  Service: Vascular;  Laterality: Left;   [place] Left 07/10/2017   Procedure: SECOND STAGE BASILIC VEIN TRANSPOSITION LEFT ARM;  Surgeon: Serafina Mitchell, MD;  [facility];  Service: Vascular;  Laterality: Left;   BREAST LUMPECTOMY Left x 2   many years apart, benign   CHOLECYSTECTOMY  1985   CYSTOSCOPY WITH  URETEROSCOPY AND STENT PLACEMENT Left 06/18/2013   Procedure: CYSTOSCOPY WITH Lef URETEROSCOPY AND Left STENT PLACEMENT;  Surgeon: Dutch Gray, MD;  [facility];  Service: Urology;  Laterality: Left;   ESOPHAGOGASTRODUODENOSCOPY (EGD) WITH PROPOFOL N/A 11/13/2016   Procedure: ESOPHAGOGASTRODUODENOSCOPY (EGD) WITH PROPOFOL;  Surgeon: Otis Brace, MD;  Location: [place];  Service: Gastroenterology;  Laterality: N/A;   EUS N/A 02/07/2016   Procedure: ESOPHAGEAL ENDOSCOPIC ULTRASOUND (EUS) RADIAL;  Surgeon: Arta Silence, MD;  [facility];  Service: Endoscopy;  Laterality: N/A;   EYE SURGERY Bilateral 2014   ioc for catracts    FOOT SURGERY Left 1990   something with toes unsure what    HERNIA REPAIR  2018   IR RADIOLOGIST EVAL & MGMT  05/18/2021   PARATHYROID EXPLORATION     WHIPPLE PROCEDURE N/A 04/23/2016   Procedure: WHIPPLE PROCEDURE;  Surgeon: Stark Klein, MD;  Location: [place];  Service: General;  Laterality: N/A;    Allergies: Gemfibrozil, Lotemax [loteprednol etabonate], Statins, Doxycycline, Codeine, and Hydrocodone-acetaminophen  Medications: Prior to Admission medications   Medication Sig Start Date End Date Taking? Authorizing Provider  acetaminophen (TYLENOL 8 HOUR ARTHRITIS PAIN) 650 MG CR tablet Take 1,300 mg by mouth at bedtime.   Yes [provider]  Biotin 10000 MCG TABS Take 20,000 mcg by mouth in the morning and at bedtime.  Yes [provider]  colestipol (COLESTID) 1 g tablet Take 1 g by mouth 2 (two) times daily as needed (diarrhea).   Yes [provider]  diclofenac sodium (VOLTAREN) 1 % GEL Apply 2 g topically 4 (four) times daily as needed (for hand pain).    Yes [provider]  escitalopram (LEXAPRO) 20 MG tablet Take 20 mg by mouth at bedtime.   Yes [provider]  insulin aspart (NOVOLOG FLEXPEN) 100 UNIT/ML FlexPen Inject 10 Units into the skin 3 (three) times daily with meals.   Yes  [provider]  lidocaine-prilocaine (EMLA) cream Apply 1 Application topically as needed (port access).   Yes [provider]  Lifitegrast 5 % SOLN Place 1 drop into both eyes at bedtime.   Yes [provider]  lipase/protease/amylase (CREON) 36000 UNITS CPEP capsule Take 08,657-84,696 Units by mouth See admin instructions. 72,000 units (3 caps) with each meal and 36,000 units (1 cap) with snacks   Yes [provider]  midodrine (PROAMATINE) 10 MG tablet Take 10 mg by mouth every Monday, Wednesday, and Friday with hemodialysis.   Yes [provider]  Multiple Vitamins-Minerals (PRESERVISION AREDS 2+MULTI VIT PO) Take 1 capsule by mouth in the morning and at bedtime.   Yes [provider]  Propylene Glycol 0.6 % SOLN Place 1 drop into both eyes 4 (four) times daily as needed (for dry eyes).   Yes [provider]  sevelamer carbonate (RENVELA) 800 MG tablet Take 1,600 mg by mouth 3 (three) times daily with meals.   Yes [provider]     Family History  Problem Relation Age of Onset   Heart disease Mother    Stroke Mother    Cancer Father        colon   Alzheimer's disease Sister    Alzheimer's disease Sister    Cancer Brother        brain   Breast cancer Neg Hx     Social History   Socioeconomic History   Marital status: Widowed    Spouse name: Not on file   Number of children: Not on file   Years of education: Not on file   Highest education level: Not on file  Occupational History   Not on file  Tobacco Use   Smoking status: Never   Smokeless tobacco: Never  Vaping Use   Vaping Use: Never used  Substance and Sexual Activity   Alcohol use: No   Drug use: No   Sexual activity: Not on file  Other Topics Concern   Not on file  Social History Narrative   Not on file   Social Determinants of Health   Financial Resource Strain: Not on file  Food Insecurity: Not on file  Transportation Needs: Not on  file  Physical Activity: Not on file  Stress: Not on file  Social Connections: Not on file      Review of Systems: A 12 point ROS discussed and pertinent positives are indicated in the HPI above.  All other systems are negative.  Review of Systems  Constitutional:  Negative for chills and fever.  Respiratory:  Negative for chest tightness and shortness of breath.   Cardiovascular:  Negative for chest pain and leg swelling.  Gastrointestinal:  Positive for diarrhea. Negative for abdominal pain, nausea and vomiting.       Pt states she has chronic diarrhea after undergoing a Whipple procedure  Neurological:  Positive for headaches. Negative for dizziness.  Psychiatric/Behavioral:  Negative for confusion.     Vital Signs: There were no vitals taken for this visit.    Physical Exam Constitutional:      General: She is not in acute distress.    Appearance: Normal appearance. She is normal weight.  HENT:     Head: Normocephalic.     Mouth/Throat:     Mouth: Mucous membranes are moist.  Cardiovascular:     Rate and Rhythm: Normal rate and regular rhythm.     Pulses: Normal pulses.     Heart sounds: Normal heart sounds.  Abdominal:     General: Bowel sounds are normal.     Palpations: Abdomen is soft.  Skin:    General: Skin is warm and dry.  Neurological:     Mental Status: She is alert and oriented to person, place, and time.  Psychiatric:        Mood and Affect: Mood normal.        Behavior: Behavior normal.        Thought Content: Thought content normal.        Judgment: Judgment normal.     Imaging: ECHOCARDIOGRAM COMPLETE  Result Date: 08/08/2021    ECHOCARDIOGRAM REPORT   Patient Name:   Kim Burns Date of Exam: 08/08/2021 Medical Rec #:  [id number]        Height:       64.5 in Accession #:    [id number]       Weight:       127.0 lb Date of Birth:  07/30/1935        BSA:          1.622 m Patient Age:    28 years         BP:           146/70 mmHg Patient  Gender: F                HR:           63 bpm. Exam Location:  [place] Procedure: 2D Echo, Cardiac Doppler, Color Doppler and 3D Echo Indications:    Pre-op eval  History:        Patient has prior history of Echocardiogram examinations, most                 recent 06/19/2013. End stage renal disease (HCC)                 Orthostatic hypotension                 Controlled type 2 diabetes mellitus with stage 4 chronic kidney                 disease, unspecified whether long term insulin use ([place].  Sonographer:    [name] RDCS Referring Phys: [id number] PETER M Martinique IMPRESSIONS  1. GLS was not performed     . Left ventricular ejection fraction, by estimation, is 60 to 65%. The left ventricle has normal function. The left ventricle has no regional wall motion abnormalities. There is mild concentric left ventricular hypertrophy. Left ventricular diastolic  parameters are consistent with Grade I diastolic dysfunction (impaired relaxation).  2. Right ventricular systolic function is normal. The right ventricular size is normal.  3. Left atrial size was mildly dilated.  4. The mitral valve is normal in structure. Mild mitral valve regurgitation. No evidence of mitral stenosis. Moderate mitral annular calcification.  5. The aortic valve is tricuspid.  Aortic valve regurgitation is not visualized. No aortic stenosis is present.  6. The inferior vena cava is normal in size with greater than 50% respiratory variability, suggesting right atrial pressure of 3 mmHg. FINDINGS  Left Ventricle: GLS was not performed. Left ventricular ejection fraction, by estimation, is 60 to 65%. The left ventricle has normal function. The left ventricle has no regional wall motion abnormalities. The left ventricular internal cavity size was normal in size. There is mild concentric left ventricular hypertrophy. Left ventricular diastolic parameters are consistent with Grade I diastolic dysfunction (impaired relaxation). Indeterminate filling  pressures. Right Ventricle: The right ventricular size is normal. No increase in right ventricular wall thickness. Right ventricular systolic function is normal. Left Atrium: Left atrial size was mildly dilated. Right Atrium: Right atrial size was normal in size. Pericardium: There is no evidence of pericardial effusion. Mitral Valve: The mitral valve is normal in structure. Moderate mitral annular calcification. Mild mitral valve regurgitation. No evidence of mitral valve stenosis. Tricuspid Valve: The tricuspid valve is normal in structure. Tricuspid valve regurgitation is not demonstrated. No evidence of tricuspid stenosis. Aortic Valve: The aortic valve is tricuspid. Aortic valve regurgitation is not visualized. No aortic stenosis is present. Aortic valve mean gradient measures 5.0 mmHg. Aortic valve peak gradient measures 8.6 mmHg. Pulmonic Valve: The pulmonic valve was normal in structure. Pulmonic valve regurgitation is not visualized. No evidence of pulmonic stenosis. Aorta: The aortic root is normal in size and structure, the aortic arch was not well visualized and the ascending aorta was not well visualized. Ascending aorta measurements are within normal limits for age when indexed to body surface area. Venous: The pulmonary veins were not well visualized. The inferior vena cava is normal in size with greater than 50% respiratory variability, suggesting right atrial pressure of 3 mmHg. IAS/Shunts: The interatrial septum appears to be lipomatous. No atrial level shunt detected by color flow Doppler.  LEFT VENTRICLE PLAX 2D LVIDd:         4.50 cm Diastology LVIDs:         2.80 cm LV e' medial:    6.42 cm/s LV PW:         1.00 cm LV E/e' medial:  12.7 LV IVS:        1.10 cm LV e' lateral:   9.90 cm/s                        LV E/e' lateral: 8.3                         3D Volume EF:                        3D EF:        67 %                        LV EDV:       92 ml                        LV ESV:       30 ml                         LV SV:        62 ml RIGHT VENTRICLE RV Basal diam:  3.10 cm RV S prime:  11.00 cm/s TAPSE (M-mode): 2.0 cm LEFT ATRIUM             Index        RIGHT ATRIUM           Index LA diam:        4.10 cm 2.53 cm/m   RA Area:     14.60 cm LA Vol (A2C):   61.9 ml 38.17 ml/m  RA Volume:   31.90 ml  19.67 ml/m LA Vol (A4C):   44.0 ml 27.13 ml/m LA Biplane Vol: 56.5 ml 34.84 ml/m  AORTIC VALVE AV Vmax:           147.00 cm/s AV Vmean:          105.000 cm/s AV VTI:            0.320 m AV Peak Grad:      8.6 mmHg AV Mean Grad:      5.0 mmHg LVOT Vmax:         102.00 cm/s LVOT Vmean:        75.300 cm/s LVOT VTI:          0.227 m LVOT/AV VTI ratio: 0.71  AORTA Ao Root diam: 3.10 cm MITRAL VALVE MV Area (PHT): 2.07 cm    SHUNTS MV Decel Time: 367 msec    Systemic VTI: 0.23 m MV E velocity: 81.80 cm/s MV A velocity: 95.10 cm/s MV E/A ratio:  0.86 Shirlee More MD Electronically signed by Shirlee More MD Signature Date/Time: 08/08/2021/6:07:34 PM    Final     Labs:  CBC: Recent Labs    11/24/20 1301 11/24/20 1306 12/29/20 1330 01/26/21 1348 01/26/21 1349 02/23/21 1204 02/23/21 1209  WBC 12.5*  --  9.1 8.3  --  7.1  --   HGB 9.4*   < > 8.6* 9.9* 9.9* 11.2* 10.7*  HCT 31.4*  --  27.8* 33.1*  --  36.2  --   PLT 336  --  285 294  --  263  --    < > = values in this interval not displayed.    COAGS: No results for input(s): "INR", "APTT" in the last 8760 hours.  BMP: Recent Labs    11/24/20 1301 12/29/20 1330 01/26/21 1348 02/23/21 1204  NA 140 140 138 138  K 4.4 4.4 4.9 4.3  CL 108 110 103 107  CO2 22 19* 23 21*  GLUCOSE 152* 135* 138* 193*  BUN 54* 58* 71* 65*  CALCIUM 9.2  9.0 8.9 9.4 8.7*  7.6*  CREATININE 4.16* 4.48* 4.70* 4.46*  GFRNONAA 10* 9* 9* 9*    LIVER FUNCTION TESTS: Recent Labs    11/24/20 1301 12/29/20 1330 01/26/21 1348 02/23/21 1204  BILITOT 0.6 0.7 0.4 0.6  AST 36 29 42* 51*  ALT 43 50* 52* 70*  ALKPHOS 67 53 41 45  PROT 6.2* 6.2* 6.5 6.5   ALBUMIN 2.8* 3.0* 3.4* 3.4*    TUMOR MARKERS: No results for input(s): "AFPTM", "CEA", "CA199", "CHROMGRNA" in the last 8760 hours.  Assessment and Plan: Kim Burns is an 86 year old patient known to IR with PMH of post-operative nausea & vomiting, stroke, diabetes, bradycardia, anemia of chronic disease, and end-stage renal disease on dialysis since January 2023 who is being seen today for left renal bland embolization and microwave ablation. Imaging has been reviewed and patient approved for procedures on 08/22/2021 with Dr Pascal Lux.  Risks and benefits discussed with the patient including, but not limited to  bleeding, infection, vascular injury, contrast induced renal failure, or non-target embolization.  All of the patient's questions were answered, patient is agreeable to proceed. Consent signed and in patient room.    Thank you for this interesting consult.  I greatly enjoyed meeting Kim Burns and look forward to participating in their care.  A copy of this report was sent to the requesting provider on this date.  Electronically Signed: Lura Em, PA-C 08/22/2021, 7:45 AM   I spent a total of  20 minutes   in face to face in clinical consultation, greater than 50% of which was counseling/coordinating care for left renal mass.

## 2021-08-22 NOTE — Transfer of Care (Signed)
Immediate Anesthesia Transfer of Care Note  Patient: Kim Burns  Procedure(s) Performed: Procedure(s): MICROWAVE ABLATION (Left)  Patient Location: PACU  Anesthesia Type:General  Level of Consciousness:  sedated, patient cooperative and responds to stimulation  Airway & Oxygen Therapy:Patient Spontanous Breathing and Patient connected to face mask oxgen  Post-op Assessment:  Report given to PACU RN and Post -op Vital signs reviewed and stable  Post vital signs:  Reviewed and stable  Last Vitals: There were no vitals filed for this visit.  Complications: No apparent anesthesia complications

## 2021-08-22 NOTE — Anesthesia Procedure Notes (Signed)
Procedure Name: Intubation Date/Time: 08/22/2021 11:00 AM  Performed by: Lavina Hamman, CRNAPre-anesthesia Checklist: Patient identified, Emergency Drugs available, Suction available, Patient being monitored and Timeout performed Patient Re-evaluated:Patient Re-evaluated prior to induction Oxygen Delivery Method: Circle system utilized Preoxygenation: Pre-oxygenation with 100% oxygen Induction Type: IV induction Ventilation: Mask ventilation without difficulty Laryngoscope Size: Mac and 3 Grade View: Grade II Tube type: Oral Tube size: 7.0 mm Number of attempts: 1 Airway Equipment and Method: Stylet Placement Confirmation: ETT inserted through vocal cords under direct vision, positive ETCO2, CO2 detector and breath sounds checked- equal and bilateral Secured at: 22 cm Tube secured with: Tape Dental Injury: Teeth and Oropharynx as per pre-operative assessment  Comments: ATOI

## 2021-08-22 NOTE — Anesthesia Postprocedure Evaluation (Signed)
Anesthesia Post Note  Patient: Kim Burns  Procedure(s) Performed: MICROWAVE ABLATION (Left)     Patient location during evaluation: PACU Anesthesia Type: General Level of consciousness: awake and alert Pain management: pain level controlled Vital Signs Assessment: post-procedure vital signs reviewed and stable Respiratory status: spontaneous breathing, nonlabored ventilation, respiratory function stable and patient connected to nasal cannula oxygen Cardiovascular status: blood pressure returned to baseline and stable Postop Assessment: no apparent nausea or vomiting Anesthetic complications: no   No notable events documented.  Last Vitals:  Vitals:   08/22/21 1500 08/22/21 1530  BP: (!) 168/93 (!) 175/97  Pulse: 92 88  Resp: 16 15  Temp:    SpO2: 100% 100%    Last Pain:  Vitals:   08/22/21 1530  PainSc: 0-No pain                 Lyonel Morejon L Welles W[place]all

## 2021-08-23 ENCOUNTER — Encounter (HOSPITAL_COMMUNITY): Payer: Self-pay | Admitting: Interventional Radiology

## 2021-08-23 DIAGNOSIS — R911 Solitary pulmonary nodule: Secondary | ICD-10-CM | POA: Diagnosis not present

## 2021-08-23 DIAGNOSIS — E213 Hyperparathyroidism, unspecified: Secondary | ICD-10-CM | POA: Diagnosis present

## 2021-08-23 DIAGNOSIS — R000 Tachycardia, unspecified: Secondary | ICD-10-CM | POA: Diagnosis not present

## 2021-08-23 DIAGNOSIS — J984 Other disorders of lung: Secondary | ICD-10-CM | POA: Diagnosis not present

## 2021-08-23 DIAGNOSIS — I700 Atherosclerosis of aorta: Secondary | ICD-10-CM | POA: Diagnosis not present

## 2021-08-23 DIAGNOSIS — K8689 Other specified diseases of pancreas: Secondary | ICD-10-CM | POA: Diagnosis not present

## 2021-08-23 DIAGNOSIS — I639 Cerebral infarction, unspecified: Secondary | ICD-10-CM | POA: Diagnosis not present

## 2021-08-23 DIAGNOSIS — G4730 Sleep apnea, unspecified: Secondary | ICD-10-CM | POA: Diagnosis present

## 2021-08-23 DIAGNOSIS — R778 Other specified abnormalities of plasma proteins: Secondary | ICD-10-CM | POA: Diagnosis not present

## 2021-08-23 DIAGNOSIS — F419 Anxiety disorder, unspecified: Secondary | ICD-10-CM | POA: Diagnosis present

## 2021-08-23 DIAGNOSIS — Z794 Long term (current) use of insulin: Secondary | ICD-10-CM | POA: Diagnosis not present

## 2021-08-23 DIAGNOSIS — I21A1 Myocardial infarction type 2: Secondary | ICD-10-CM | POA: Diagnosis present

## 2021-08-23 DIAGNOSIS — T82510A Breakdown (mechanical) of surgically created arteriovenous fistula, initial encounter: Secondary | ICD-10-CM | POA: Diagnosis not present

## 2021-08-23 DIAGNOSIS — E1122 Type 2 diabetes mellitus with diabetic chronic kidney disease: Secondary | ICD-10-CM | POA: Diagnosis present

## 2021-08-23 DIAGNOSIS — C642 Malignant neoplasm of left kidney, except renal pelvis: Secondary | ICD-10-CM | POA: Diagnosis present

## 2021-08-23 DIAGNOSIS — I2510 Atherosclerotic heart disease of native coronary artery without angina pectoris: Secondary | ICD-10-CM | POA: Diagnosis present

## 2021-08-23 DIAGNOSIS — N2581 Secondary hyperparathyroidism of renal origin: Secondary | ICD-10-CM | POA: Diagnosis present

## 2021-08-23 DIAGNOSIS — N186 End stage renal disease: Secondary | ICD-10-CM | POA: Diagnosis present

## 2021-08-23 DIAGNOSIS — Z9842 Cataract extraction status, left eye: Secondary | ICD-10-CM | POA: Diagnosis not present

## 2021-08-23 DIAGNOSIS — K219 Gastro-esophageal reflux disease without esophagitis: Secondary | ICD-10-CM | POA: Diagnosis present

## 2021-08-23 DIAGNOSIS — Z8673 Personal history of transient ischemic attack (TIA), and cerebral infarction without residual deficits: Secondary | ICD-10-CM | POA: Diagnosis not present

## 2021-08-23 DIAGNOSIS — I672 Cerebral atherosclerosis: Secondary | ICD-10-CM | POA: Diagnosis not present

## 2021-08-23 DIAGNOSIS — D490 Neoplasm of unspecified behavior of digestive system: Secondary | ICD-10-CM | POA: Diagnosis not present

## 2021-08-23 DIAGNOSIS — I120 Hypertensive chronic kidney disease with stage 5 chronic kidney disease or end stage renal disease: Secondary | ICD-10-CM | POA: Diagnosis present

## 2021-08-23 DIAGNOSIS — D631 Anemia in chronic kidney disease: Secondary | ICD-10-CM | POA: Diagnosis present

## 2021-08-23 DIAGNOSIS — I953 Hypotension of hemodialysis: Secondary | ICD-10-CM | POA: Diagnosis not present

## 2021-08-23 DIAGNOSIS — I480 Paroxysmal atrial fibrillation: Secondary | ICD-10-CM | POA: Diagnosis present

## 2021-08-23 DIAGNOSIS — Y828 Other medical devices associated with adverse incidents: Secondary | ICD-10-CM | POA: Diagnosis not present

## 2021-08-23 DIAGNOSIS — Y712 Prosthetic and other implants, materials and accessory cardiovascular devices associated with adverse incidents: Secondary | ICD-10-CM | POA: Diagnosis not present

## 2021-08-23 DIAGNOSIS — T82838A Hemorrhage of vascular prosthetic devices, implants and grafts, initial encounter: Secondary | ICD-10-CM | POA: Diagnosis not present

## 2021-08-23 DIAGNOSIS — H539 Unspecified visual disturbance: Secondary | ICD-10-CM | POA: Diagnosis not present

## 2021-08-23 DIAGNOSIS — Z992 Dependence on renal dialysis: Secondary | ICD-10-CM | POA: Diagnosis not present

## 2021-08-23 DIAGNOSIS — I6340 Cerebral infarction due to embolism of unspecified cerebral artery: Secondary | ICD-10-CM | POA: Diagnosis not present

## 2021-08-23 DIAGNOSIS — R079 Chest pain, unspecified: Secondary | ICD-10-CM | POA: Diagnosis not present

## 2021-08-23 DIAGNOSIS — G9389 Other specified disorders of brain: Secondary | ICD-10-CM | POA: Diagnosis not present

## 2021-08-23 DIAGNOSIS — G8191 Hemiplegia, unspecified affecting right dominant side: Secondary | ICD-10-CM | POA: Diagnosis not present

## 2021-08-23 DIAGNOSIS — D734 Cyst of spleen: Secondary | ICD-10-CM | POA: Diagnosis not present

## 2021-08-23 DIAGNOSIS — I4891 Unspecified atrial fibrillation: Secondary | ICD-10-CM | POA: Diagnosis not present

## 2021-08-23 DIAGNOSIS — I63233 Cerebral infarction due to unspecified occlusion or stenosis of bilateral carotid arteries: Secondary | ICD-10-CM | POA: Diagnosis not present

## 2021-08-23 DIAGNOSIS — E559 Vitamin D deficiency, unspecified: Secondary | ICD-10-CM | POA: Diagnosis present

## 2021-08-23 DIAGNOSIS — R1900 Intra-abdominal and pelvic swelling, mass and lump, unspecified site: Secondary | ICD-10-CM | POA: Diagnosis not present

## 2021-08-23 DIAGNOSIS — Y92239 Unspecified place in hospital as the place of occurrence of the external cause: Secondary | ICD-10-CM | POA: Diagnosis not present

## 2021-08-23 DIAGNOSIS — M47812 Spondylosis without myelopathy or radiculopathy, cervical region: Secondary | ICD-10-CM | POA: Diagnosis not present

## 2021-08-23 DIAGNOSIS — N2889 Other specified disorders of kidney and ureter: Secondary | ICD-10-CM | POA: Diagnosis not present

## 2021-08-23 DIAGNOSIS — I6349 Cerebral infarction due to embolism of other cerebral artery: Secondary | ICD-10-CM | POA: Diagnosis not present

## 2021-08-23 DIAGNOSIS — I214 Non-ST elevation (NSTEMI) myocardial infarction: Secondary | ICD-10-CM | POA: Diagnosis not present

## 2021-08-23 DIAGNOSIS — Z960 Presence of urogenital implants: Secondary | ICD-10-CM | POA: Diagnosis present

## 2021-08-23 LAB — GLUCOSE, CAPILLARY
Glucose-Capillary: 103 mg/dL — ABNORMAL HIGH (ref 70–99)
Glucose-Capillary: 137 mg/dL — ABNORMAL HIGH (ref 70–99)

## 2021-08-23 MED ORDER — OXYCODONE-ACETAMINOPHEN 5-325 MG PO TABS: 1.0000 | ORAL_TABLET | Freq: Four times a day (QID) | ORAL | 0 refills | Status: DC | PRN

## 2021-08-23 MED ORDER — OXYCODONE-ACETAMINOPHEN 5-325 MG PO TABS
1.0000 | ORAL_TABLET | Freq: Once | ORAL | Status: AC
  Administered 2021-08-23: 1 via ORAL
  Filled 2021-08-23: qty 1

## 2021-08-23 NOTE — Discharge Instructions (Signed)
Resume home medications; avoid strenuous activity for at least one week; do not drive for 24 hours after discharge or after taking narcotics.

## 2021-08-23 NOTE — Discharge Summary (Signed)
Patient ID: Kim Burns MRN: [id number] DOB/AGE: Apr 04, 1935 86 y.o.  Admit date: 08/22/2021 Discharge date: 08/23/2021  Supervising Physician: Sandi Mariscal  Patient Status: [facility] - In-pt  Admission Diagnoses: Renal carcinoma of left kidney  Discharge Diagnoses:  Principal Problem:   Renal cell carcinoma of left kidney [facility])   Discharged Condition: good  Hospital Course: Patient presented to IR on 7/5 for a left renal bland embolization and microwave ablation. She tolerated the procedure well and was admitted for overnight observation. Patient states she was uncomfortable overnight due to frequent alarms from her IV, as well as some left-sided flank pain that radiates towards lower left extremity, but she is otherwise feeling ok. She expressed a desire on 7/6 to be discharged home with her daughter who will be staying with her through the end of July.  Consults: None  Significant Diagnostic Studies: N/A  Treatments: Left renal arteriogram with left renal bland embolization and microwave ablation  Discharge Exam: Blood pressure (!) 173/89, pulse 97, temperature 99.2 F (37.3 C), temperature source Oral, resp. rate 13, height '5\' 2"'$  (1.575 m), weight 124 lb (56.2 kg), SpO2 98 %. General appearance: alert, cooperative, no distress, and reported 6/10 pain Resp: clear to auscultation bilaterally Cardio: regular rate and rhythm, S1, S2 normal, no murmur, click, rub or gallop Skin: Skin color, texture, turgor normal. No rashes or lesions Incision/Wound: Right groin puncture site soft, non-pulsatile, and non-tender to touch. Puncture sites on left back/flank are non-erythematous, non-tender, no signs of infection.  Additionally, patient is reporting left sided flank pain that radiates to left lower extremity  Disposition: Discharge disposition: 01-Home or Self Care       Discharge Instructions     Call MD for:  difficulty breathing, headache or visual disturbances   Complete  by: As directed    Call MD for:  extreme fatigue   Complete by: As directed    Call MD for:  hives   Complete by: As directed    Call MD for:  persistant dizziness or light-headedness   Complete by: As directed    Call MD for:  persistant nausea and vomiting   Complete by: As directed    Call MD for:  redness, tenderness, or signs of infection (pain, swelling, redness, odor or green/yellow discharge around incision site)   Complete by: As directed    Call MD for:  severe uncontrolled pain   Complete by: As directed    Call MD for:  temperature >100.4   Complete by: As directed    Diet - low sodium heart healthy   Complete by: As directed    Discharge instructions   Complete by: As directed    Resume home medications; avoid strenuous activity for one week; take pain medication with food   Driving Restrictions   Complete by: As directed    Do not drive for 24 hours after discharge or after taking narcotics   Increase activity slowly   Complete by: As directed    Lifting restrictions   Complete by: As directed    No heavy lifting for at least one week after discharge   Remove dressing in 24 hours   Complete by: As directed       Allergies as of 08/23/2021       Reactions   Gemfibrozil Other (See Comments)   Myalgia    Lotemax [loteprednol Etabonate] Other (See Comments)   burning   Statins Other (See Comments)   Muscle weakness  Doxycycline Other (See Comments)   UNSPECIFIED REACTION    Codeine Other (See Comments)   headache   Hydrocodone-acetaminophen Other (See Comments)   Headache. Tolerates acetaminophen.        Medication List     TAKE these medications    Biotin 10000 MCG Tabs Take 20,000 mcg by mouth in the morning and at bedtime.   colestipol 1 g tablet Commonly known as: COLESTID Take 1 g by mouth 2 (two) times daily as needed (diarrhea).   diclofenac sodium 1 % Gel Commonly known as: VOLTAREN Apply 2 g topically 4 (four) times daily as needed  (for hand pain).   escitalopram 20 MG tablet Commonly known as: LEXAPRO Take 20 mg by mouth at bedtime.   lidocaine-prilocaine cream Commonly known as: EMLA Apply 1 Application topically as needed (port access).   Lifitegrast 5 % Soln Place 1 drop into both eyes at bedtime.   lipase/protease/amylase 36000 UNITS Cpep capsule Commonly known as: CREON Take 36,000-72,000 Units by mouth See admin instructions. 72,000 units (3 caps) with each meal and 36,000 units (1 cap) with snacks   midodrine 10 MG tablet Commonly known as: PROAMATINE Take 10 mg by mouth every Monday, Wednesday, and Friday with hemodialysis.   NovoLOG FlexPen 100 UNIT/ML FlexPen Generic drug: insulin aspart Inject 10 Units into the skin 3 (three) times daily with meals.   oxyCODONE-acetaminophen 5-325 MG tablet Commonly known as: Percocet Take 1 tablet by mouth every 6 (six) hours as needed for severe pain.   PRESERVISION AREDS 2+MULTI VIT PO Take 1 capsule by mouth in the morning and at bedtime.   Propylene Glycol 0.6 % Soln Place 1 drop into both eyes 4 (four) times daily as needed (for dry eyes).   sevelamer carbonate 800 MG tablet Commonly known as: RENVELA Take 1,600 mg by mouth 3 (three) times daily with meals.   Tylenol 8 Hour Arthritis Pain 650 MG CR tablet Generic drug: acetaminophen Take 1,300 mg by mouth at bedtime.        Follow-up Information     Sandi Mariscal, MD Follow up in 3 week(s).   Specialties: Interventional Radiology, Radiology Why: Phone call follow-up in approximately 3 weeks. IR scheduler will reach out to patient to schedule this. Please call at [phone] or [phone] with any questions or concerns Contact information: [place] [address] [phone]         Ardis Hughs, MD Follow up.   Specialty: Urology Why: Follow-up with Dr Louis Meckel as scheduled Contact information: [place]  [address] [phone]                  Electronically Signed: Lura Em, PA-C 08/23/2021, 1:25 PM   I have spent Less Than 30 Minutes discharging Kim Burns.

## 2021-08-24 LAB — GLUCOSE, CAPILLARY: Glucose-Capillary: 185 mg/dL — ABNORMAL HIGH (ref 70–99)

## 2021-08-25 ENCOUNTER — Encounter (HOSPITAL_COMMUNITY): Payer: Self-pay | Admitting: Radiology

## 2021-08-25 ENCOUNTER — Inpatient Hospital Stay (HOSPITAL_COMMUNITY)
Admission: EM | Admit: 2021-08-25 | Discharge: 2021-08-31 | DRG: 252 | Disposition: A | Discharge status: Home / Self Care | Payer: Medicare PPO | Attending: General Practice | Admitting: General Practice

## 2021-08-25 ENCOUNTER — Other Ambulatory Visit: Payer: Self-pay

## 2021-08-25 ENCOUNTER — Emergency Department (HOSPITAL_COMMUNITY): Payer: Medicare PPO

## 2021-08-25 DIAGNOSIS — I214 Non-ST elevation (NSTEMI) myocardial infarction: Secondary | ICD-10-CM | POA: Diagnosis present

## 2021-08-25 DIAGNOSIS — E162 Hypoglycemia, unspecified: Secondary | ICD-10-CM | POA: Diagnosis not present

## 2021-08-25 DIAGNOSIS — E213 Hyperparathyroidism, unspecified: Secondary | ICD-10-CM | POA: Diagnosis present

## 2021-08-25 DIAGNOSIS — E559 Vitamin D deficiency, unspecified: Secondary | ICD-10-CM | POA: Diagnosis present

## 2021-08-25 DIAGNOSIS — I953 Hypotension of hemodialysis: Secondary | ICD-10-CM | POA: Diagnosis not present

## 2021-08-25 DIAGNOSIS — Z794 Long term (current) use of insulin: Secondary | ICD-10-CM

## 2021-08-25 DIAGNOSIS — R778 Other specified abnormalities of plasma proteins: Secondary | ICD-10-CM

## 2021-08-25 DIAGNOSIS — Z79899 Other long term (current) drug therapy: Secondary | ICD-10-CM

## 2021-08-25 DIAGNOSIS — Z8249 Family history of ischemic heart disease and other diseases of the circulatory system: Secondary | ICD-10-CM

## 2021-08-25 DIAGNOSIS — N2581 Secondary hyperparathyroidism of renal origin: Secondary | ICD-10-CM | POA: Diagnosis present

## 2021-08-25 DIAGNOSIS — Z885 Allergy status to narcotic agent status: Secondary | ICD-10-CM

## 2021-08-25 DIAGNOSIS — D631 Anemia in chronic kidney disease: Secondary | ICD-10-CM | POA: Diagnosis present

## 2021-08-25 DIAGNOSIS — Y712 Prosthetic and other implants, materials and accessory cardiovascular devices associated with adverse incidents: Secondary | ICD-10-CM | POA: Diagnosis not present

## 2021-08-25 DIAGNOSIS — Z90411 Acquired partial absence of pancreas: Secondary | ICD-10-CM

## 2021-08-25 DIAGNOSIS — Z961 Presence of intraocular lens: Secondary | ICD-10-CM | POA: Diagnosis present

## 2021-08-25 DIAGNOSIS — K219 Gastro-esophageal reflux disease without esophagitis: Secondary | ICD-10-CM | POA: Diagnosis present

## 2021-08-25 DIAGNOSIS — Z992 Dependence on renal dialysis: Secondary | ICD-10-CM | POA: Diagnosis not present

## 2021-08-25 DIAGNOSIS — I6349 Cerebral infarction due to embolism of other cerebral artery: Secondary | ICD-10-CM | POA: Diagnosis not present

## 2021-08-25 DIAGNOSIS — G8191 Hemiplegia, unspecified affecting right dominant side: Secondary | ICD-10-CM | POA: Diagnosis not present

## 2021-08-25 DIAGNOSIS — Z8673 Personal history of transient ischemic attack (TIA), and cerebral infarction without residual deficits: Secondary | ICD-10-CM

## 2021-08-25 DIAGNOSIS — T82510A Breakdown (mechanical) of surgically created arteriovenous fistula, initial encounter: Secondary | ICD-10-CM | POA: Diagnosis not present

## 2021-08-25 DIAGNOSIS — C642 Malignant neoplasm of left kidney, except renal pelvis: Secondary | ICD-10-CM | POA: Diagnosis present

## 2021-08-25 DIAGNOSIS — Z820 Family history of epilepsy and other diseases of the nervous system: Secondary | ICD-10-CM

## 2021-08-25 DIAGNOSIS — Z888 Allergy status to other drugs, medicaments and biological substances status: Secondary | ICD-10-CM

## 2021-08-25 DIAGNOSIS — N186 End stage renal disease: Secondary | ICD-10-CM | POA: Diagnosis present

## 2021-08-25 DIAGNOSIS — I120 Hypertensive chronic kidney disease with stage 5 chronic kidney disease or end stage renal disease: Secondary | ICD-10-CM | POA: Diagnosis present

## 2021-08-25 DIAGNOSIS — Z800 Family history of malignant neoplasm of digestive organs: Secondary | ICD-10-CM

## 2021-08-25 DIAGNOSIS — R911 Solitary pulmonary nodule: Secondary | ICD-10-CM | POA: Diagnosis present

## 2021-08-25 DIAGNOSIS — I21A1 Myocardial infarction type 2: Secondary | ICD-10-CM | POA: Diagnosis present

## 2021-08-25 DIAGNOSIS — G4730 Sleep apnea, unspecified: Secondary | ICD-10-CM | POA: Diagnosis present

## 2021-08-25 DIAGNOSIS — E1122 Type 2 diabetes mellitus with diabetic chronic kidney disease: Secondary | ICD-10-CM | POA: Diagnosis present

## 2021-08-25 DIAGNOSIS — Z9842 Cataract extraction status, left eye: Secondary | ICD-10-CM

## 2021-08-25 DIAGNOSIS — R221 Localized swelling, mass and lump, neck: Secondary | ICD-10-CM

## 2021-08-25 DIAGNOSIS — Z823 Family history of stroke: Secondary | ICD-10-CM

## 2021-08-25 DIAGNOSIS — E119 Type 2 diabetes mellitus without complications: Secondary | ICD-10-CM

## 2021-08-25 DIAGNOSIS — Z809 Family history of malignant neoplasm, unspecified: Secondary | ICD-10-CM

## 2021-08-25 DIAGNOSIS — Z808 Family history of malignant neoplasm of other organs or systems: Secondary | ICD-10-CM

## 2021-08-25 DIAGNOSIS — H539 Unspecified visual disturbance: Secondary | ICD-10-CM | POA: Diagnosis not present

## 2021-08-25 DIAGNOSIS — I4891 Unspecified atrial fibrillation: Secondary | ICD-10-CM | POA: Diagnosis present

## 2021-08-25 DIAGNOSIS — R7989 Other specified abnormal findings of blood chemistry: Secondary | ICD-10-CM

## 2021-08-25 DIAGNOSIS — I480 Paroxysmal atrial fibrillation: Principal | ICD-10-CM | POA: Diagnosis present

## 2021-08-25 DIAGNOSIS — Z960 Presence of urogenital implants: Secondary | ICD-10-CM | POA: Diagnosis present

## 2021-08-25 DIAGNOSIS — Z7901 Long term (current) use of anticoagulants: Secondary | ICD-10-CM

## 2021-08-25 DIAGNOSIS — R079 Chest pain, unspecified: Secondary | ICD-10-CM

## 2021-08-25 DIAGNOSIS — T82838A Hemorrhage of vascular prosthetic devices, implants and grafts, initial encounter: Secondary | ICD-10-CM | POA: Diagnosis not present

## 2021-08-25 DIAGNOSIS — D490 Neoplasm of unspecified behavior of digestive system: Secondary | ICD-10-CM | POA: Diagnosis present

## 2021-08-25 DIAGNOSIS — F419 Anxiety disorder, unspecified: Secondary | ICD-10-CM | POA: Diagnosis present

## 2021-08-25 DIAGNOSIS — R29700 NIHSS score 0: Secondary | ICD-10-CM | POA: Diagnosis not present

## 2021-08-25 DIAGNOSIS — I2510 Atherosclerotic heart disease of native coronary artery without angina pectoris: Secondary | ICD-10-CM | POA: Diagnosis present

## 2021-08-25 DIAGNOSIS — Z9841 Cataract extraction status, right eye: Secondary | ICD-10-CM

## 2021-08-25 DIAGNOSIS — Y92239 Unspecified place in hospital as the place of occurrence of the external cause: Secondary | ICD-10-CM | POA: Diagnosis not present

## 2021-08-25 DIAGNOSIS — Y828 Other medical devices associated with adverse incidents: Secondary | ICD-10-CM | POA: Diagnosis not present

## 2021-08-25 HISTORY — DX: End stage renal disease: Z99.2

## 2021-08-25 HISTORY — DX: Dependence on renal dialysis: N18.6

## 2021-08-25 LAB — CBC WITH DIFFERENTIAL/PLATELET
Abs Immature Granulocytes: 0.06 10*3/uL (ref 0.00–0.07)
Basophils Absolute: 0 10*3/uL (ref 0.0–0.1)
Basophils Relative: 0 %
Eosinophils Absolute: 0 10*3/uL (ref 0.0–0.5)
Eosinophils Relative: 0 %
HCT: 33.2 % — ABNORMAL LOW (ref 36.0–46.0)
Hemoglobin: 10.7 g/dL — ABNORMAL LOW (ref 12.0–15.0)
Immature Granulocytes: 1 %
Lymphocytes Relative: 7 %
Lymphs Abs: 1 10*3/uL (ref 0.7–4.0)
MCH: 29.2 pg (ref 26.0–34.0)
MCHC: 32.2 g/dL (ref 30.0–36.0)
MCV: 90.7 fL (ref 80.0–100.0)
Monocytes Absolute: 0.7 10*3/uL (ref 0.1–1.0)
Monocytes Relative: 5 %
Neutro Abs: 11.3 10*3/uL — ABNORMAL HIGH (ref 1.7–7.7)
Neutrophils Relative %: 87 %
Platelets: 219 10*3/uL (ref 150–400)
RBC: 3.66 MIL/uL — ABNORMAL LOW (ref 3.87–5.11)
RDW: 15.8 % — ABNORMAL HIGH (ref 11.5–15.5)
WBC: 13.1 10*3/uL — ABNORMAL HIGH (ref 4.0–10.5)
nRBC: 0 % (ref 0.0–0.2)

## 2021-08-25 LAB — TROPONIN I (HIGH SENSITIVITY)
Troponin I (High Sensitivity): 1098 ng/L (ref <18)
Troponin I (High Sensitivity): 925 ng/L (ref <18)

## 2021-08-25 LAB — COMPREHENSIVE METABOLIC PANEL
ALT: 23 U/L (ref 0–44)
AST: 21 U/L (ref 15–41)
Albumin: 2.9 g/dL — ABNORMAL LOW (ref 3.5–5.0)
Alkaline Phosphatase: 87 U/L (ref 38–126)
Anion gap: 18 — ABNORMAL HIGH (ref 5–15)
BUN: 27 mg/dL — ABNORMAL HIGH (ref 8–23)
CO2: 27 mmol/L (ref 22–32)
Calcium: 8.7 mg/dL — ABNORMAL LOW (ref 8.9–10.3)
Chloride: 89 mmol/L — ABNORMAL LOW (ref 98–111)
Creatinine, Ser: 3.63 mg/dL — ABNORMAL HIGH (ref 0.44–1.00)
GFR, Estimated: 12 mL/min — ABNORMAL LOW (ref >60)
Glucose, Bld: 180 mg/dL — ABNORMAL HIGH (ref 70–99)
Potassium: 3.9 mmol/L (ref 3.5–5.1)
Sodium: 134 mmol/L — ABNORMAL LOW (ref 135–145)
Total Bilirubin: 0.8 mg/dL (ref 0.3–1.2)
Total Protein: 6.6 g/dL (ref 6.5–8.1)

## 2021-08-25 MED ORDER — SODIUM CHLORIDE 0.9 % IV BOLUS
500.0000 mL | Freq: Once | INTRAVENOUS | Status: AC
  Administered 2021-08-25: 500 mL via INTRAVENOUS

## 2021-08-25 MED ORDER — DILTIAZEM HCL-DEXTROSE 125-5 MG/125ML-% IV SOLN (PREMIX)
0.0000 mg/h | INTRAVENOUS | Status: DC
  Administered 2021-08-25: 5 mg/h via INTRAVENOUS
  Filled 2021-08-25: qty 125

## 2021-08-25 MED ORDER — HEPARIN BOLUS VIA INFUSION
3000.0000 [IU] | Freq: Once | INTRAVENOUS | Status: AC
  Administered 2021-08-25: 3000 [IU] via INTRAVENOUS
  Filled 2021-08-25: qty 3000

## 2021-08-25 MED ORDER — IOHEXOL 300 MG/ML  SOLN
75.0000 mL | Freq: Once | INTRAMUSCULAR | Status: AC | PRN
  Administered 2021-08-25: 75 mL via INTRAVENOUS

## 2021-08-25 MED ORDER — HEPARIN (PORCINE) 25000 UT/250ML-% IV SOLN
1400.0000 [IU]/h | INTRAVENOUS | Status: DC
  Administered 2021-08-25: 800 [IU]/h via INTRAVENOUS
  Administered 2021-08-26: 1200 [IU]/h via INTRAVENOUS
  Filled 2021-08-25 (×2): qty 250

## 2021-08-25 NOTE — ED Provider Notes (Signed)
[facility] Provider Note   CSN: [id number] Arrival date & time: 08/25/21  1842     History  Chief Complaint  Patient presents with   Tachycardia    Kim Burns is a 86 y.o. female.  HPI Patient presents with rapid heart rate.  Has been feeling weak particularly today.  Had ablation and angiogram with coiling of left kidney mass.  Was done 3 days ago.  Felt a little weak that night and a little weak the next day then better and then worse again.  Son found patient to be tachycardic today.  Is a dialysis patient and had missed a dialysis for the procedure.  Found to be in A-fib.  No history of A-fib.  Did have some chest tightness however today.  Worse with breathing.  Had episode of it last night also.  It was in the middle of the chest.  Also slight back pain.   Past Medical History:  Diagnosis Date   Anemia of chronic disease    takes iron   Anxiety    Blood transfusion without reported diagnosis    Bradycardia    CKD (chronic kidney disease) stage 3, GFR 30-59 ml/min [facility])    sees dr Arty Baumgartner sees next in jan 2018   Diabetes mellitus without complication ([place])    became diabetic after Whipple procedure   Dialysis patient [facility])    M-W-F AV graft left arm   Diarrhea    Dysrhythmia 04/16/2016   bradycardia due to medication    GERD (gastroesophageal reflux disease)    Gout    Headache    History of kidney stones 06/2013   Hyperparathyroidism ([place])    Hypertension    PONV (postoperative nausea and vomiting)    Sleep apnea    no cpap machine. could not tolerate   Stroke ([place]) 04/16/2016   TIA 1995   Vitamin D deficiency     Home Medications Prior to Admission medications   Medication Sig Start Date End Date Taking? Authorizing Provider  acetaminophen (TYLENOL 8 HOUR ARTHRITIS PAIN) 650 MG CR tablet Take 1,300 mg by mouth at bedtime.    [provider]  Biotin 10000 MCG TABS Take 20,000 mcg by mouth in the  morning and at bedtime.    [provider]  colestipol (COLESTID) 1 g tablet Take 1 g by mouth 2 (two) times daily as needed (diarrhea).    [provider]  diclofenac sodium (VOLTAREN) 1 % GEL Apply 2 g topically 4 (four) times daily as needed (for hand pain).     [provider]  escitalopram (LEXAPRO) 20 MG tablet Take 20 mg by mouth at bedtime.    [provider]  insulin aspart (NOVOLOG FLEXPEN) 100 UNIT/ML FlexPen Inject 10 Units into the skin 3 (three) times daily with meals.    [provider]  lidocaine-prilocaine (EMLA) cream Apply 1 Application topically as needed (port access).    [provider]  Lifitegrast 5 % SOLN Place 1 drop into both eyes at bedtime.    [provider]  lipase/protease/amylase (CREON) 36000 UNITS CPEP capsule Take 83,382-50,539 Units by mouth See admin instructions. 72,000 units (3 caps) with each meal and 36,000 units (1 cap) with snacks    [provider]  midodrine (PROAMATINE) 10 MG tablet Take 10 mg by mouth every Monday, Wednesday, and Friday with hemodialysis.    [provider]  Multiple Vitamins-Minerals (PRESERVISION AREDS 2+MULTI VIT PO) Take 1  capsule by mouth in the morning and at bedtime.    [provider]  oxyCODONE-acetaminophen (PERCOCET) 5-325 MG tablet Take 1 tablet by mouth every 6 (six) hours as needed for severe pain. 08/23/21   Lura Em, PA  Propylene Glycol 0.6 % SOLN Place 1 drop into both eyes 4 (four) times daily as needed (for dry eyes).    [provider]  sevelamer carbonate (RENVELA) 800 MG tablet Take 1,600 mg by mouth 3 (three) times daily with meals.    [provider]      Allergies    Gemfibrozil, Lotemax [loteprednol etabonate], Statins, Doxycycline, Codeine, and Hydrocodone-acetaminophen    Review of Systems   Review of Systems  Physical Exam Updated Vital Signs BP 111/77   Pulse (!) 130   Temp 98.7 F  (37.1 C) (Oral)   Resp 16   Ht '5\' 2"'$  (1.575 m)   Wt 56.2 kg   SpO2 95%   BMI 22.68 kg/m  Physical Exam Vitals and nursing note reviewed.  Eyes:     Pupils: Pupils are equal, round, and reactive to light.  Cardiovascular:     Rate and Rhythm: Tachycardia present. Rhythm irregular.  Pulmonary:     Effort: Pulmonary effort is normal.  Abdominal:     Tenderness: There is no abdominal tenderness.  Musculoskeletal:        General: No tenderness.     Cervical back: Neck supple.     Right lower leg: No edema.     Left lower leg: No edema.  Skin:    General: Skin is warm.     Capillary Refill: Capillary refill takes less than 2 seconds.  Neurological:     Mental Status: She is alert. Mental status is at baseline.     ED Results / Procedures / Treatments   Labs (all labs ordered are listed, but only abnormal results are displayed) Labs Reviewed  CBC WITH DIFFERENTIAL/PLATELET - Abnormal; Notable for the following components:      Result Value   WBC 13.1 (*)    RBC 3.66 (*)    Hemoglobin 10.7 (*)    HCT 33.2 (*)    RDW 15.8 (*)    Neutro Abs 11.3 (*)    All other components within normal limits  COMPREHENSIVE METABOLIC PANEL - Abnormal; Notable for the following components:   Sodium 134 (*)    Chloride 89 (*)    Glucose, Bld 180 (*)    BUN 27 (*)    Creatinine, Ser 3.63 (*)    Calcium 8.7 (*)    Albumin 2.9 (*)    GFR, Estimated 12 (*)    Anion gap 18 (*)    All other components within normal limits  TROPONIN I (HIGH SENSITIVITY) - Abnormal; Notable for the following components:   Troponin I (High Sensitivity) 1,098 (*)    All other components within normal limits  TROPONIN I (HIGH SENSITIVITY) - Abnormal; Notable for the following components:   Troponin I (High Sensitivity) 925 (*)    All other components within normal limits    EKG EKG Interpretation  Date/Time:  Saturday August 25 2021 18:54:25 EDT Ventricular Rate:  182 PR Interval:    QRS Duration: 74 QT  Interval:  320 QTC Calculation: 556 R Axis:   89 Text Interpretation: Atrial fibrillation Septal infarct , age undetermined ST & T wave abnormality, consider inferior ischemia ST & T wave abnormality, consider anterolateral ischemia Abnormal ECG When compared with ECG of  12-Nov-2016 16:57, PREVIOUS ECG IS PRESENT Confirmed by Davonna Belling [phone]) on 08/25/2021 8:19:01 PM  Radiology CT CHEST ABDOMEN PELVIS W CONTRAST  Result Date: 08/25/2021 CLINICAL DATA:  History of recent renal ablation with tachycardia, initial encounter EXAM: CT CHEST, ABDOMEN, AND PELVIS WITH CONTRAST TECHNIQUE: Multidetector CT imaging of the chest, abdomen and pelvis was performed following the standard protocol during bolus administration of intravenous contrast. RADIATION DOSE REDUCTION: This exam was performed according to the departmental dose-optimization program which includes automated exposure control, adjustment of the mA and/or kV according to patient size and/or use of iterative reconstruction technique. CONTRAST:  85m OMNIPAQUE IOHEXOL 300 MG/ML  SOLN COMPARISON:  None Available. FINDINGS: CT CHEST FINDINGS Cardiovascular: Scattered atherosclerotic calcifications are noted. No aneurysmal dilatation or dissection is noted. No cardiac enlargement is seen. The pulmonary artery shows a normal branching pattern bilaterally. No filling defect to suggest pulmonary embolism is noted. Scattered coronary calcifications are noted. Mediastinum/Nodes: Thoracic inlet is within normal limits. No hilar or mediastinal adenopathy is noted. The esophagus is within normal limits. Lungs/Pleura: Lungs are well aerated bilaterally. No focal infiltrate or sizable effusion is noted. Minimal scarring is noted in the right base. Tiny less than 4 mm nodule is noted in the superior segment of the left lower lobe. No other nodules are noted. Musculoskeletal: Degenerative changes of the thoracic spine are noted. No rib abnormality is noted. CT  ABDOMEN PELVIS FINDINGS Hepatobiliary: No focal liver abnormality is seen. Status post cholecystectomy. No biliary dilatation. Pancreas: Postsurgical changes are noted consistent with prior Whipple. The residual pancreas is atrophic. Spleen: Scattered cysts are noted within the spleen. Adrenals/Urinary Tract: Adrenal glands are within normal limits. Kidneys demonstrate multiple hypodense lesions throughout both kidneys. A mildly enhancing solid lesion is noted in the medial aspect of the left lower pole. This has undergone recent ablation and coil embolization. No new focal abnormality is noted. No obstructive changes are seen. The bladder is decompressed. Stomach/Bowel: No obstructive or inflammatory changes of the colon are seen. Postsurgical changes are noted consistent with the prior Whipple procedure. The appendix is not well visualized and may have been surgically removed. No inflammatory changes are seen. Visualized small bowel is within normal limits aside from the previously described surgical change. Vascular/Lymphatic: Aortic atherosclerosis. No enlarged abdominal or pelvic lymph nodes. Reproductive: Status post hysterectomy. No adnexal masses. Other: No abdominal wall hernia or abnormality. No abdominopelvic ascites. Musculoskeletal: Degenerative changes of lumbar spine are noted. No acute bony abnormality is noted. IMPRESSION: CT of the chest: No evidence of pulmonary emboli. 3 mm left solid pulmonary nodule. No routine follow-up imaging is recommended per Fleischner Society Guidelines. These guidelines do not apply to immunocompromised patients and patients with cancer. Follow up in patients with significant comorbidities as clinically warranted. For lung cancer screening, adhere to Lung-RADS guidelines. Reference: Radiology. 2017; 284(1):228-43. CT of the abdomen and pelvis: Changes consistent with prior Whipple procedure. Stable solid mass in the medial aspect of the left kidney. This has recently  undergone ablation as well as coil embolization. No acute abnormality noted. Electronically Signed   By: MInez CatalinaM.D.   On: 08/25/2021 22:06    Procedures Procedures    Medications Ordered in ED Medications  diltiazem (CARDIZEM) 125 mg in dextrose 5% 125 mL (1 mg/mL) infusion (10 mg/hr Intravenous Rate/Dose Change 08/25/21 2217)  sodium chloride 0.9 % bolus 500 mL (0 mLs Intravenous Stopped 08/25/21 2216)  iohexol (OMNIPAQUE) 300 MG/ML solution 75 mL (75 mLs Intravenous Contrast Given 08/25/21  2145)    ED Course/ Medical Decision Making/ A&P                           Medical Decision Making Amount and/or Complexity of Data Reviewed Labs: ordered. Radiology: ordered.  Risk Prescription drug management.   Patient presents with chest pain and fast heart rate.  Pain was worse with breathing.  Anterior chest.  Had recent renal ablation both microwave and it sounds as if she had angiogram with some coiling.  Had been doing well until developed the pain and tachycardia.  Found to be in new onset A-fib RVR.  High risk and will require anticoagulation.  However will need to look further towards the cause of the A-fib.  Pulmonary embolism has to be considered and will get CT angiography to evaluate for it.  Also thought of bleeding.  Discussed with Owens Shark from interventional radiology who did the procedure.  Patient is a candidate for anticoagulation if needed.  We will get angiography and he will put in the other protocols to be used for the scan.  Hemoglobin is just slightly decreased from prior and I doubt it is severe dropped because the tachycardia.  Troponin however is elevated.  Discussed with Dr. Renella Cunas from cardiology who will be following along.  Second troponin is stable to slightly decreasing.  It is reassuring.  Started on Cardizem drip and gradually decreasing heart rate.  CT scan done and independently interpreted by me and reviewed radiology note and shows no pulm embolism.  Also  no significant bleeding.  Cardiology had recommended apixaban 2.5 mg twice a day.  Patient also will likely need dialysis during admission.  Will discuss with hospitalist  CRITICAL CARE Performed by: Davonna Belling Total critical care time: 30 minutes Critical care time was exclusive of separately billable procedures and treating other patients. Critical care was necessary to treat or prevent imminent or life-threatening deterioration. Critical care was time spent personally by me on the following activities: development of treatment plan with patient and/or surrogate as well as nursing, discussions with consultants, evaluation of patient's response to treatment, examination of patient, obtaining history from patient or surrogate, ordering and performing treatments and interventions, ordering and review of laboratory studies, ordering and review of radiographic studies, pulse oximetry and re-evaluation of patient's condition.    Chadsvasc of at least 4         Final Clinical Impression(s) / ED Diagnoses Final diagnoses:  Atrial fibrillation with rapid ventricular response ([place])  End stage renal disease on dialysis [facility])    Rx / DC Orders ED Discharge Orders     None         Davonna Belling, MD 08/25/21 2227

## 2021-08-25 NOTE — Progress Notes (Signed)
ANTICOAGULATION CONSULT NOTE - Initial Consult  Pharmacy Consult for heparin Indication: atrial fibrillation  Allergies  Allergen Reactions   Gemfibrozil Other (See Comments)    Myalgia    Lotemax [Loteprednol Etabonate] Other (See Comments)    burning   Statins Other (See Comments)    Muscle weakness   Doxycycline Other (See Comments)    UNSPECIFIED REACTION    Codeine Other (See Comments)    headache   Hydrocodone-Acetaminophen Other (See Comments)    Headache. Tolerates acetaminophen.    Patient Measurements: Height: '5\' 2"'$  (157.5 cm) Weight: 56.2 kg (124 lb) IBW/kg (Calculated) : 50.1  Vital Signs: Temp: 98.7 F (37.1 C) (07/08 1854) Temp Source: Oral (07/08 1854) BP: 111/77 (07/08 2200) Pulse Rate: 130 (07/08 2200)  Labs: Recent Labs    08/25/21 1917 08/25/21 2118  HGB 10.7*  --   HCT 33.2*  --   PLT 219  --   CREATININE 3.63*  --   TROPONINIHS 1,098* 925*    Estimated Creatinine Clearance: 9.1 mL/min (A) (by C-G formula based on SCr of 3.63 mg/dL (H)).   Medical History: Past Medical History:  Diagnosis Date   Anemia of chronic disease    takes iron   Anxiety    Blood transfusion without reported diagnosis    Bradycardia    CKD (chronic kidney disease) stage 3, GFR 30-59 ml/min [facility])    sees dr Arty Baumgartner sees next in jan 2018   Diabetes mellitus without complication ([place])    became diabetic after Whipple procedure   Dialysis patient [facility])    M-W-F AV graft left arm   Diarrhea    Dysrhythmia 04/16/2016   bradycardia due to medication    GERD (gastroesophageal reflux disease)    Gout    Headache    History of kidney stones 06/2013   Hyperparathyroidism ([place])    Hypertension    PONV (postoperative nausea and vomiting)    Sleep apnea    no cpap machine. could not tolerate   Stroke ([place]) 04/16/2016   TIA 1995   Vitamin D deficiency     Assessment: 86yo female c/o rapid HR and chest tightness >> found to be in Afib with RVR, no known h/o  Afib >> to begin heparin.  Goal of Therapy:  Heparin level 0.3-0.7 units/ml Monitor platelets by anticoagulation protocol: Yes   Plan:  Heparin 3000 units IV bolus x1 followed by infusion at 800 units/hr and monitor heparin levels and CBC.  Wynona Neat, PharmD, BCPS  08/25/2021,11:11 PM

## 2021-08-25 NOTE — ED Triage Notes (Signed)
Pt was brought in by her son with complaints of rapid heart rate. Pt doesn't have a history of Afib but she present with Afib RVR.

## 2021-08-25 NOTE — Consult Note (Incomplete)
Cardiology Consultation:   Patient ID: Kim Burns MRN: [id number]; DOB: 12-20-1935  Admit date: 08/25/2021 Date of Consult: 08/26/2021  Primary Care Provider: [name], MD [facility] Cardiologist: Peter Martinique, MD  [facility] Electrophysiologist:  None   Patient Profile:   Kim Burns is a 86 y.o. female with HTN, LVH and prior evaluation of bradycardia, IPMN s/p prior Whipple procedure (04/23/16) with pancreatectomy and partial bowel resection, L sided RCC s/p L renal bland embolization and microwave ablation (08/22/21), ESRD on iHD starting 02/2021 (MWF), GERD, distant prior TIA (1995) and DM2  who is being seen today for the evaluation of chest pain and new onset AF/RVR at the request of Dr. Alvino Chapel.   History of Present Illness:   Kim Burns presents to hospital with her daughter and son-in-law.  She reports that the night prior to admission (07/07, 2300) she was getting ready for bed and had acute onset central nonradiating chest pain that was "20/10" severity.  Her son-in-law works for the [employer] and took her pulse during that time which she said was regular between 100-110.  She had no previously documented history of atrial arrhythmias and was not on any OAC.  Her chest pain eventually improved after she went to sleep however this morning (07/07) some chest discomfort with deep inspiration that was not present at rest.  She also noticed some worsening chest discomfort when she was trying to get out of her car.  She had never had any similar symptoms like this before.  She has no prior tobacco use or family history of premature CAD but does have several risk factors for coronary disease.  On evaluation in the emergency department she was found to have newly diagnosed AF/RVR with ventricular rates to the 180s.  She had no rhythm awareness when in atrial fibrillation and during my evaluation her heart rate was 120-130.  She had 5 out of 10 intermittent chest  discomfort worse with deep inspiration.  It was difficult to assess for positional changes as she recently had RFA access with RCC embolization on Wednesday.   During my evaluation she was sitting comfortably in an reported 4-5/10 severity chest discomfort.   She currently still lives alone and just requires intermittent walker assistance with takes care of all her own ADLs.  Her daughter is recently come up from [place] to help out along with her son-in-law and son.  VS during my evaluation: P 126, BP 101/69 (78), RR 19, O2 90%/RA.   Past Medical History:  Diagnosis Date   Anemia of chronic disease    takes iron   Anxiety    Blood transfusion without reported diagnosis    Bradycardia    CKD (chronic kidney disease) stage 3, GFR 30-59 ml/min [facility])    sees dr Arty Baumgartner sees next in jan 2018   Diabetes mellitus without complication ([place])    became diabetic after Whipple procedure   Dialysis patient [facility])    M-W-F AV graft left arm   Diarrhea    Dysrhythmia 04/16/2016   bradycardia due to medication    GERD (gastroesophageal reflux disease)    Gout    Headache    History of kidney stones 06/2013   Hyperparathyroidism ([place])    Hypertension    PONV (postoperative nausea and vomiting)    Sleep apnea    no cpap machine. could not tolerate   Stroke ([place]) 04/16/2016   TIA 1995   Vitamin D deficiency    Past  Surgical History:  Procedure Laterality Date   ABDOMINAL HYSTERECTOMY  1985   complete   BACK SURGERY  4650   lower   BASCILIC VEIN TRANSPOSITION Left 04/24/2017   Procedure: LEFT ARM FIRST STAGE BASILIC VEIN TRANSPOSITION;  Surgeon: Serafina Mitchell, MD;  Location: [place];  Service: Vascular;  Laterality: Left;   [place] Left 07/10/2017   Procedure: SECOND STAGE BASILIC VEIN TRANSPOSITION LEFT ARM;  Surgeon: Serafina Mitchell, MD;  Location[facility];  Service: Vascular;  Laterality: Left;   BREAST LUMPECTOMY Left x 2   many years apart, benign    CHOLECYSTECTOMY  1985   CYSTOSCOPY WITH URETEROSCOPY AND STENT PLACEMENT Left 06/18/2013   Procedure: CYSTOSCOPY WITH Lef URETEROSCOPY AND Left STENT PLACEMENT;  Surgeon: Dutch Gray, MD;  Location: [facility];  Service: Urology;  Laterality: Left;   ESOPHAGOGASTRODUODENOSCOPY (EGD) WITH PROPOFOL N/A 11/13/2016   Procedure: ESOPHAGOGASTRODUODENOSCOPY (EGD) WITH PROPOFOL;  Surgeon: Otis Brace, MD;  Location: [place];  Service: Gastroenterology;  Laterality: N/A;   EUS N/A 02/07/2016   Procedure: ESOPHAGEAL ENDOSCOPIC ULTRASOUND (EUS) RADIAL;  Surgeon: Arta Silence, MD;  [facility];  Service: Endoscopy;  Laterality: N/A;   EYE SURGERY Bilateral 2014   ioc for catracts    FOOT SURGERY Left 1990   something with toes unsure what    HERNIA REPAIR  2018   IR EMBO TUMOR ORGAN ISCHEMIA INFARCT INC GUIDE ROADMAPPING  08/22/2021   IR RADIOLOGIST EVAL & MGMT  05/18/2021   IR RENAL SUPRASEL UNI S&I MOD SED  08/22/2021   IR US GUIDE VASC ACCESS RIGHT  08/22/2021   PARATHYROID EXPLORATION     RADIOLOGY WITH ANESTHESIA Left 08/22/2021   Procedure: MICROWAVE ABLATION;  Surgeon: Sandi Mariscal, MD;  Location: [facility];  Service: Anesthesiology;  Laterality: Left;   WHIPPLE PROCEDURE N/A 04/23/2016   Procedure: WHIPPLE PROCEDURE;  Surgeon: Stark Klein, MD;  Location: [place];  Service: General;  Laterality: N/A;    Home Medications:  Prior to Admission medications   Medication Sig Start Date End Date Taking? Authorizing Provider  acetaminophen (TYLENOL 8 HOUR ARTHRITIS PAIN) 650 MG CR tablet Take 1,300 mg by mouth at bedtime.    [provider]  Biotin 10000 MCG TABS Take 20,000 mcg by mouth in the morning and at bedtime.    [provider]  colestipol (COLESTID) 1 g tablet Take 1 g by mouth 2 (two) times daily as needed (diarrhea).    [provider]  diclofenac sodium (VOLTAREN) 1 % GEL Apply 2 g topically 4 (four) times daily as needed (for hand pain).     [provider]  escitalopram (LEXAPRO) 20 MG tablet Take 20 mg by mouth at bedtime.    [provider]  insulin aspart (NOVOLOG FLEXPEN) 100 UNIT/ML FlexPen Inject 10 Units into the skin 3 (three) times daily with meals.    [provider]  lidocaine-prilocaine (EMLA) cream Apply 1 Application topically as needed (port access).    [provider]  Lifitegrast 5 % SOLN Place 1 drop into both eyes at bedtime.    [provider]  lipase/protease/amylase (CREON) 36000 UNITS CPEP capsule Take 35,465-68,127 Units by mouth See admin instructions. 72,000 units (3 caps) with each meal and 36,000 units (1 cap) with snacks    [provider]  midodrine (PROAMATINE) 10 MG tablet Take 10 mg by mouth every Monday, Wednesday, and Friday with hemodialysis.    [provider]  Multiple Vitamins-Minerals (  PRESERVISION AREDS 2+MULTI VIT PO) Take 1 capsule by mouth in the morning and at bedtime.    [provider]  oxyCODONE-acetaminophen (PERCOCET) 5-325 MG tablet Take 1 tablet by mouth every 6 (six) hours as needed for severe pain. 08/23/21   Lura Em, PA  Propylene Glycol 0.6 % SOLN Place 1 drop into both eyes 4 (four) times daily as needed (for dry eyes).    [provider]  sevelamer carbonate (RENVELA) 800 MG tablet Take 1,600 mg by mouth 3 (three) times daily with meals.    [provider]    Inpatient Medications: Scheduled Meds:  [START ON 08/27/2021] aspirin EC  81 mg Oral Daily   insulin aspart  0-9 Units Subcutaneous TID WC   Continuous Infusions:  diltiazem (CARDIZEM) infusion Stopped (08/26/21 0410)   heparin 800 Units/hr (08/25/21 2324)   PRN Meds:   Allergies:    Allergies  Allergen Reactions   Gemfibrozil Other (See Comments)    Myalgia    Lotemax [Loteprednol Etabonate] Other (See Comments)    burning   Statins Other (See Comments)    Muscle weakness   Doxycycline Other (See Comments)    UNSPECIFIED  REACTION    Codeine Other (See Comments)    headache   Hydrocodone-Acetaminophen Other (See Comments)    Headache. Tolerates acetaminophen.    Social History:   Social History   Socioeconomic History   Marital status: Widowed    Spouse name: Not on file   Number of children: Not on file   Years of education: Not on file   Highest education level: Not on file  Occupational History   Not on file  Tobacco Use   Smoking status: Never   Smokeless tobacco: Never  Vaping Use   Vaping Use: Never used  Substance and Sexual Activity   Alcohol use: No   Drug use: No   Sexual activity: Not on file  Other Topics Concern   Not on file  Social History Narrative   Not on file   Social Determinants of Health   Financial Resource Strain: Not on file  Food Insecurity: Not on file  Transportation Needs: Not on file  Physical Activity: Not on file  Stress: Not on file  Social Connections: Not on file  Intimate Partner Violence: Not on file    Family History:    Family History  Problem Relation Age of Onset   Heart disease Mother    Stroke Mother    Cancer Father        colon   Alzheimer's disease Sister    Alzheimer's disease Sister    Cancer Brother        brain   Breast cancer Neg Hx     ROS:  Review of Systems: [y] = yes, _0  = no      General: Weight gain _1 ; Weight loss _2 ; Anorexia _3 ; Fatigue _4 ; Fever _5 ; Chills _6 ; Weakness _7    Cardiac: Chest pain/pressure [y]; Resting SOB _8 ; Exertional SOB _9 ; Orthopnea _10 ; Pedal Edema _11 ; Palpitations _12 ; Syncope _13 ; Presyncope _14 ; Paroxysmal nocturnal dyspnea _15    Pulmonary: Cough _16 ; Wheezing _17 ; Hemoptysis _18 ; Sputum _19 ; Snoring _20    GI: Vomiting _21 ; Dysphagia _22 ; Melena _23 ; Hematochezia _24 ; Heartburn _25 ; Abdominal pain _26 ; Constipation _27 ; Diarrhea _28 ; BRBPR _29    GU:  Hematuria _0 ; Dysuria _1 ; Nocturia _2  Vascular: Pain in legs with walking _3 ; Pain in feet with lying flat _4 ; Non-healing  sores _5 ; Stroke _6 ; TIA _7 ; Slurred speech _8 ;   Neuro: Headaches _9 ; Vertigo _10 ; Seizures _11 ; Paresthesias _12 ;Blurred vision _13 ; Diplopia _14 ; Vision changes _15    Ortho/Skin: Arthritis _16 ; Joint pain _17 ; Muscle pain _18 ; Joint swelling _19 ; Back Pain _20 ; Rash _21    Psych: Depression _22 ; Anxiety _23    Heme: Bleeding problems _24 ; Clotting disorders _25 ; Anemia _26    Endocrine: Diabetes _27 ; Thyroid dysfunction _28    Physical Exam/Data:   Vitals:   08/26/21 0500 08/26/21 0520 08/26/21 0530 08/26/21 0600  BP: (!) 86/65 (!) 89/61 96/66 (!) 123/56  Pulse: (!) 55 62 63 67  Resp: 20 (!) 22 (!) 21 (!) 22  Temp:      TempSrc:      SpO2: 97% 99% 98% 93%  Weight:      Height:        Intake/Output Summary (Last 24 hours) at 08/26/2021 0700 Last data filed at 08/25/2021 2216 Gross per 24 hour  Intake 500 ml  Output --  Net 500 ml      08/25/2021    7:25 PM 08/22/2021    6:58 PM 08/22/2021    7:57 AM  Last 3 Weights  Weight (lbs) 124 lb 124 lb 124 lb  Weight (kg) 56.246 kg 56.246 kg 56.246 kg     Body mass index is 22.68 kg/m.  General:  Well nourished, well developed, in no acute distress HEENT: normal Lymph: no adenopathy Neck: no JVD Endocrine:  No thryomegaly Vascular: No carotid bruits; FA pulses 2+ bilaterally without bruits  Cardiac: irregular rhythm, accelerated rate  Lungs:  clear to auscultation bilaterally, no wheezing, rhonchi or rales  Abd: soft, nontender, no hepatomegaly  Ext: no edema Musculoskeletal:  No deformities, BUE and BLE strength normal and equal Skin: warm and dry, LUE fistula  Neuro:  CNs 2-12 intact, no focal abnormalities noted Psych:  Normal affect   EKG:  The EKG was personally reviewed and demonstrates: (08/25/21, 18:54:25), AF/RVR with venticular rate of 182, QRS 74 ms, Qtc 556 ms Telemetry:  Telemetry was personally reviewed and demonstrates: AF/RVR, ventricular rates 120-180  Relevant CV Studies:  TTE Result date: 08/08/21  1.  GLS was not performed      . Left ventricular ejection fraction, by estimation, is 60 to 65%. The  left ventricle has normal function. The left ventricle has no regional  wall motion abnormalities. There is mild concentric left ventricular  hypertrophy. Left ventricular diastolic   parameters are consistent with Grade I diastolic dysfunction (impaired  relaxation).   2. Right ventricular systolic function is normal. The right ventricular  size is normal.   3. Left atrial size was mildly dilated.   4. The mitral valve is normal in structure. Mild mitral valve  regurgitation. No evidence of mitral stenosis. Moderate mitral annular  calcification.   5. The aortic valve is tricuspid. Aortic valve regurgitation is not  visualized. No aortic stenosis is present.   6. The inferior vena cava is normal in size with greater than 50%  respiratory variability, suggesting right atrial pressure of 3 mmHg.   Laboratory Data:  High Sensitivity Troponin:   Recent Labs  Lab 08/25/21 1917 08/25/21 2118  TROPONINIHS 1,098*  925*     Chemistry Recent Labs  Lab 08/22/21 0740 08/25/21 1917 08/26/21 0447  NA 138 134* 132*  K 3.8 3.9 4.0  CL 98 89* 93*  CO2 _0 GLUCOSE 148* 180* 166*  BUN 31* 27* 34*  CREATININE 3.03* 3.63* 3.87*  CALCIUM 9.1 8.7* 8.4*  GFRNONAA 15* 12* 11*  ANIONGAP 11 18* 15    Recent Labs  Lab 08/25/21 1917  PROT 6.6  ALBUMIN 2.9*  AST 21  ALT 23  ALKPHOS 87  BILITOT 0.8   Hematology Recent Labs  Lab 08/22/21 0740 08/25/21 1917 08/26/21 0447  WBC 7.1 13.1* 9.6  RBC 4.15 3.66* 3.13*  HGB 11.9* 10.7* 8.9*  HCT 38.5 33.2* 28.4*  MCV 92.8 90.7 90.7  MCH 28.7 29.2 28.4  MCHC 30.9 32.2 31.3  RDW 15.4 15.8* 16.0*  PLT 227 219 207   BNPNo results for input(s): "BNP", "PROBNP" in the last 168 hours.  DDimer No results for input(s): "DDIMER" in the last 168 hours.  Radiology/Studies:  CT CHEST ABDOMEN PELVIS W CONTRAST  Result Date: 08/25/2021 CLINICAL  DATA:  History of recent renal ablation with tachycardia, initial encounter EXAM: CT CHEST, ABDOMEN, AND PELVIS WITH CONTRAST TECHNIQUE: Multidetector CT imaging of the chest, abdomen and pelvis was performed following the standard protocol during bolus administration of intravenous contrast. RADIATION DOSE REDUCTION: This exam was performed according to the departmental dose-optimization program which includes automated exposure control, adjustment of the mA and/or kV according to patient size and/or use of iterative reconstruction technique. CONTRAST:  28m OMNIPAQUE IOHEXOL 300 MG/ML  SOLN COMPARISON:  None Available. FINDINGS: CT CHEST FINDINGS Cardiovascular: Scattered atherosclerotic calcifications are noted. No aneurysmal dilatation or dissection is noted. No cardiac enlargement is seen. The pulmonary artery shows a normal branching pattern bilaterally. No filling defect to suggest pulmonary embolism is noted. Scattered coronary calcifications are noted. Mediastinum/Nodes: Thoracic inlet is within normal limits. No hilar or mediastinal adenopathy is noted. The esophagus is within normal limits. Lungs/Pleura: Lungs are well aerated bilaterally. No focal infiltrate or sizable effusion is noted. Minimal scarring is noted in the right base. Tiny less than 4 mm nodule is noted in the superior segment of the left lower lobe. No other nodules are noted. Musculoskeletal: Degenerative changes of the thoracic spine are noted. No rib abnormality is noted. CT ABDOMEN PELVIS FINDINGS Hepatobiliary: No focal liver abnormality is seen. Status post cholecystectomy. No biliary dilatation. Pancreas: Postsurgical changes are noted consistent with prior Whipple. The residual pancreas is atrophic. Spleen: Scattered cysts are noted within the spleen. Adrenals/Urinary Tract: Adrenal glands are within normal limits. Kidneys demonstrate multiple hypodense lesions throughout both kidneys. A mildly enhancing solid lesion is noted in  the medial aspect of the left lower pole. This has undergone recent ablation and coil embolization. No new focal abnormality is noted. No obstructive changes are seen. The bladder is decompressed. Stomach/Bowel: No obstructive or inflammatory changes of the colon are seen. Postsurgical changes are noted consistent with the prior Whipple procedure. The appendix is not well visualized and may have been surgically removed. No inflammatory changes are seen. Visualized small bowel is within normal limits aside from the previously described surgical change. Vascular/Lymphatic: Aortic atherosclerosis. No enlarged abdominal or pelvic lymph nodes. Reproductive: Status post hysterectomy. No adnexal masses. Other: No abdominal wall hernia or abnormality. No abdominopelvic ascites. Musculoskeletal: Degenerative changes of lumbar spine are noted. No acute bony abnormality is noted. IMPRESSION: CT of the chest: No evidence of pulmonary emboli.  3 mm left solid pulmonary nodule. No routine follow-up imaging is recommended per Fleischner Society Guidelines. These guidelines do not apply to immunocompromised patients and patients with cancer. Follow up in patients with significant comorbidities as clinically warranted. For lung cancer screening, adhere to Lung-RADS guidelines. Reference: Radiology. 2017; 284(1):228-43. CT of the abdomen and pelvis: Changes consistent with prior Whipple procedure. Stable solid mass in the medial aspect of the left kidney. This has recently undergone ablation as well as coil embolization. No acute abnormality noted. Electronically Signed   By: Inez Catalina M.D.   On: 08/25/2021 22:06   CT GUIDE TISSUE ABLATION  Result Date: 08/22/2021 INDICATION: History of renal cell carcinoma. Poor operative candidate. Patient presents today for image guided left-sided renal microwave ablation following percutaneous particle and coil embolization performed this morning. Please refer to formal consultation in epic  EMR dated 06/04/2021 additional details. EXAM: CT-GUIDED PERCUTANEOUS THERMAL ABLATION OF RENAL LESION ANESTHESIA/SEDATION: General MEDICATIONS: None, antibiotics were administered during the preceding renal arteriogram and percutaneous embolization. CONTRAST:  None COMPARISONS: COMPARISONS CT abdomen and pelvis-05/03/2021 PROCEDURE: The procedure, risks, benefits, and alternatives were explained to the patient. Questions regarding the procedure were encouraged and answered. The patient understands and consents to the procedure. The patient was placed under general anesthesia. Initial unenhanced CT was performed in a prone position to localize the at least 3.5 x 3.1 cm partially exophytic lesion involving the inferior medial aspect of the left kidney (image 67, series 2). The CT gantry table position was marked and lesion was identified sonographically. The procedure was planned. The patient was prepped with chlorhexidine in usual sterile fashion. Two 22 gauge spinal needles were utilized for procedural purposes. Next, under a combination ultrasound and CT guidance, 2 15 gauge microwave ablation probes were positioned bracketing the mid aspect the central left-sided renal lesion. Multiple ultrasound images were saved procedural documentation purposes. Appropriate positioning was confirmed and a 1 minute burn was performed at 90 watts followed by a 4 minute burn at 65 watts with intra procedural imaging obtained at the 3 minute mark of the ablation. Both probes were removed and postprocedural imaging was obtained. Access sites were anesthetized with 1% lidocaine with epinephrine and dressings were applied. The patient tolerated the procedure well without immediate postprocedural complication. COMPLICATIONS: None immediate. FINDINGS: Noncontrast preprocedural imaging demonstrates an approximally 3.5 x 3.1 cm partially exophytic lesion arising from the inferior medial aspect of the left kidney (image 67, series 2).  Contrast is noted within the hypervascular lesion as well as within the adjacent inferior pole of the left kidney following preceding particle and coil embolization. The lesion was identified sonographically and utilizing a combination of CT and ultrasound guidance, 2 probes were placed bracketing the central aspect of the lesion (representative image 17, series 12). The microwave ablation site appears to encompass the entirety of the partially exophytic left-sided renal lesion (series 13). Postprocedural imaging demonstrates expected air about the ablation site without postprocedural complication. Specifically, no pneumothorax or significant hemorrhage about the ablation site. IMPRESSION: Technically successful ultrasound and CT-guided thermal ablation of partially exophytic left-sided renal lesion. PLAN: - The patient will be admitted overnight for continued observation and PCA usage. - The patient will have a telemedicine consultation approximally 4 weeks following today's procedure with initial postprocedural imaging obtained in 3 months (early October 2023). Electronically Signed   By: Sandi Mariscal M.D.   On: 08/22/2021 14:29   IR EMBO TUMOR ORGAN ISCHEMIA INFARCT INC GUIDE ROADMAPPING  Result Date: 08/22/2021 INDICATION:  Left-sided renal cell carcinoma. Poor operative candidate. Patient presents today for left-sided renal arteriogram and percutaneous embolization prior to image guided ablation. Please refer to consultation epic EMR dated 06/04/2021 for additional details. EXAM: 1. ULTRASOUND GUIDANCE FOR ARTERIAL ACCESS 2. LEFT RENAL ARTERIOGRAM 3. SUB SELECTIVE ARTERIOGRAM OF THE INFERIOR MEDIAL DIVISION OF THE LEFT RENAL ARTERY AND PERCUTANEOUS PARTICLE AND COIL EMBOLIZATION. 4. SUB SELECTIVE ARTERIOGRAM OF THE INFEROLATERAL DIVISION OF THE LEFT RENAL ARTERY AND PERCUTANEOUS PARTICLE AND COIL EMBOLIZATION MEDICATIONS: Cefoxitin 2 gm IV. The antibiotic was administered within one hour of the procedure  ANESTHESIA/SEDATION: Moderate (conscious) sedation was employed during this procedure as administered by the Interventional Radiology RN. A total of Versed 0.5 mg and Fentanyl 50 mcg was administered intravenously. Moderate Sedation Time: 56 minutes. The patient's level of consciousness and vital signs were monitored continuously by radiology nursing throughout the procedure under my direct supervision. CONTRAST:  120 cc Omnipaque 300 FLUOROSCOPY TIME:  147 mGy COMPLICATIONS: None immediate. PROCEDURE: Informed consent was obtained from the patient following explanation of the procedure, risks, benefits and alternatives. The patient understands, agrees and consents for the procedure. All questions were addressed. A time out was performed prior to the initiation of the procedure. Maximal barrier sterile technique utilized including caps, mask, sterile gowns, sterile gloves, large sterile drape, hand hygiene, and Betadine prep. The right femoral head was marked fluoroscopically. Under sterile conditions and local anesthesia, the right common femoral artery access was performed with a micropuncture needle. Under direct ultrasound guidance, the right common femoral was accessed with a micropuncture kit. An ultrasound image was saved for documentation purposes. This allowed for placement of a 5-French vascular sheath. A limited arteriogram was performed through the side arm of the sheath confirming appropriate access within the right common femoral artery. The 5-French Mickelson catheter was advanced to the level of the thoracic aorta where it was performed, back bled and flushed. The Mickelson catheter was then utilized to select the left renal artery and a left renal arteriogram was performed. Next, with the use of a fathom 14 microwire, a cantata microcatheter was utilized to select the inferior division the left renal artery and a sub selective arteriogram was performed The microcatheter was then utilized to select  the inferolateral division of the left renal artery and a sub selective arteriogram was performed. Next, the vessel was embolized with approximally 1/5 vial of 100-300 micron Embospheres. Once vessel stasis was achieved, the vessel was embolized with overlapping 4 mm and 2 mm interlock coils. The microcatheter was partially retracted and a post embolization arteriogram was performed. The microcatheter was then utilized to select the inferior medial division of the left renal artery and a sub selective arteriogram was performed. Next, the vessel was embolized with approximally 1/5 vial of 103 100 micron Embospheres. Again, once vessel stasis was achieved, the vessel was embolized with overlapping 4 mm and 2 mm interlock coils. The microcatheter was partially retracted and a post embolization arteriogram was performed. The microcatheter was then removed and a completion arteriogram was performed via the [place] [facility] catheter. Images reviewed and the procedure was terminated. All wires, catheters and sheaths were removed the patient. Hemostasis was achieved at the right groin access site with deployment of a Celt closure device. Postprocedural spot fluoroscopic image demonstrates appropriate positioning of the puncture location at the level of the humeral head. A dressing was applied. The patient tolerated the procedure well without immediate post procedural complication. FINDINGS: Selective right renal arteriogram demonstrates a fairly well-defined  partially exophytic hypervascular lesion arising from the inferior pole of the left kidney supplied by both the inferolateral and inferior medial divisions of the left renal artery, as was suggested on preceding CTA. Following percutaneous particle and coil embolization of both the inferolateral and inferior medial divisions of the left renal artery, completion arteriogram demonstrates no residual flow to the hypervascular renal mass with preserved arterial supply to the  remainder of the renal parenchyma. IMPRESSION: Technically successful percutaneous particle and coil embolization of both the inferomedial and inferolateral divisions of the left renal artery supplying the known partially exophytic hypervascular left-sided RCC. PLAN: Patient was then escorted to CT for image guided microwave ablation of the left-sided renal mass. Electronically Signed   By: Sandi Mariscal M.D.   On: 08/22/2021 11:22   IR US Guide Vasc Access Right  Result Date: 08/22/2021 INDICATION: Left-sided renal cell carcinoma. Poor operative candidate. Patient presents today for left-sided renal arteriogram and percutaneous embolization prior to image guided ablation. Please refer to consultation epic EMR dated 06/04/2021 for additional details. EXAM: 1. ULTRASOUND GUIDANCE FOR ARTERIAL ACCESS 2. LEFT RENAL ARTERIOGRAM 3. SUB SELECTIVE ARTERIOGRAM OF THE INFERIOR MEDIAL DIVISION OF THE LEFT RENAL ARTERY AND PERCUTANEOUS PARTICLE AND COIL EMBOLIZATION. 4. SUB SELECTIVE ARTERIOGRAM OF THE INFEROLATERAL DIVISION OF THE LEFT RENAL ARTERY AND PERCUTANEOUS PARTICLE AND COIL EMBOLIZATION MEDICATIONS: Cefoxitin 2 gm IV. The antibiotic was administered within one hour of the procedure ANESTHESIA/SEDATION: Moderate (conscious) sedation was employed during this procedure as administered by the Interventional Radiology RN. A total of Versed 0.5 mg and Fentanyl 50 mcg was administered intravenously. Moderate Sedation Time: 56 minutes. The patient's level of consciousness and vital signs were monitored continuously by radiology nursing throughout the procedure under my direct supervision. CONTRAST:  120 cc Omnipaque 300 FLUOROSCOPY TIME:  371 mGy COMPLICATIONS: None immediate. PROCEDURE: Informed consent was obtained from the patient following explanation of the procedure, risks, benefits and alternatives. The patient understands, agrees and consents for the procedure. All questions were addressed. A time out was performed  prior to the initiation of the procedure. Maximal barrier sterile technique utilized including caps, mask, sterile gowns, sterile gloves, large sterile drape, hand hygiene, and Betadine prep. The right femoral head was marked fluoroscopically. Under sterile conditions and local anesthesia, the right common femoral artery access was performed with a micropuncture needle. Under direct ultrasound guidance, the right common femoral was accessed with a micropuncture kit. An ultrasound image was saved for documentation purposes. This allowed for placement of a 5-French vascular sheath. A limited arteriogram was performed through the side arm of the sheath confirming appropriate access within the right common femoral artery. The 5-French Mickelson catheter was advanced to the level of the thoracic aorta where it was performed, back bled and flushed. The Mickelson catheter was then utilized to select the left renal artery and a left renal arteriogram was performed. Next, with the use of a fathom 14 microwire, a cantata microcatheter was utilized to select the inferior division the left renal artery and a sub selective arteriogram was performed The microcatheter was then utilized to select the inferolateral division of the left renal artery and a sub selective arteriogram was performed. Next, the vessel was embolized with approximally 1/5 vial of 100-300 micron Embospheres. Once vessel stasis was achieved, the vessel was embolized with overlapping 4 mm and 2 mm interlock coils. The microcatheter was partially retracted and a post embolization arteriogram was performed. The microcatheter was then utilized to select the inferior medial division  of the left renal artery and a sub selective arteriogram was performed. Next, the vessel was embolized with approximally 1/5 vial of 103 100 micron Embospheres. Again, once vessel stasis was achieved, the vessel was embolized with overlapping 4 mm and 2 mm interlock coils. The  microcatheter was partially retracted and a post embolization arteriogram was performed. The microcatheter was then removed and a completion arteriogram was performed via the [facility] catheter. Images reviewed and the procedure was terminated. All wires, catheters and sheaths were removed the patient. Hemostasis was achieved at the right groin access site with deployment of a Celt closure device. Postprocedural spot fluoroscopic image demonstrates appropriate positioning of the puncture location at the level of the humeral head. A dressing was applied. The patient tolerated the procedure well without immediate post procedural complication. FINDINGS: Selective right renal arteriogram demonstrates a fairly well-defined partially exophytic hypervascular lesion arising from the inferior pole of the left kidney supplied by both the inferolateral and inferior medial divisions of the left renal artery, as was suggested on preceding CTA. Following percutaneous particle and coil embolization of both the inferolateral and inferior medial divisions of the left renal artery, completion arteriogram demonstrates no residual flow to the hypervascular renal mass with preserved arterial supply to the remainder of the renal parenchyma. IMPRESSION: Technically successful percutaneous particle and coil embolization of both the inferomedial and inferolateral divisions of the left renal artery supplying the known partially exophytic hypervascular left-sided RCC. PLAN: Patient was then escorted to CT for image guided microwave ablation of the left-sided renal mass. Electronically Signed   By: Sandi Mariscal M.D.   On: 08/22/2021 11:22   IR Angiogram Renal Uni Selective  Result Date: 08/22/2021 INDICATION: Left-sided renal cell carcinoma. Poor operative candidate. Patient presents today for left-sided renal arteriogram and percutaneous embolization prior to image guided ablation. Please refer to consultation epic EMR dated 06/04/2021 for  additional details. EXAM: 1. ULTRASOUND GUIDANCE FOR ARTERIAL ACCESS 2. LEFT RENAL ARTERIOGRAM 3. SUB SELECTIVE ARTERIOGRAM OF THE INFERIOR MEDIAL DIVISION OF THE LEFT RENAL ARTERY AND PERCUTANEOUS PARTICLE AND COIL EMBOLIZATION. 4. SUB SELECTIVE ARTERIOGRAM OF THE INFEROLATERAL DIVISION OF THE LEFT RENAL ARTERY AND PERCUTANEOUS PARTICLE AND COIL EMBOLIZATION MEDICATIONS: Cefoxitin 2 gm IV. The antibiotic was administered within one hour of the procedure ANESTHESIA/SEDATION: Moderate (conscious) sedation was employed during this procedure as administered by the Interventional Radiology RN. A total of Versed 0.5 mg and Fentanyl 50 mcg was administered intravenously. Moderate Sedation Time: 56 minutes. The patient's level of consciousness and vital signs were monitored continuously by radiology nursing throughout the procedure under my direct supervision. CONTRAST:  120 cc Omnipaque 300 FLUOROSCOPY TIME:  563 mGy COMPLICATIONS: None immediate. PROCEDURE: Informed consent was obtained from the patient following explanation of the procedure, risks, benefits and alternatives. The patient understands, agrees and consents for the procedure. All questions were addressed. A time out was performed prior to the initiation of the procedure. Maximal barrier sterile technique utilized including caps, mask, sterile gowns, sterile gloves, large sterile drape, hand hygiene, and Betadine prep. The right femoral head was marked fluoroscopically. Under sterile conditions and local anesthesia, the right common femoral artery access was performed with a micropuncture needle. Under direct ultrasound guidance, the right common femoral was accessed with a micropuncture kit. An ultrasound image was saved for documentation purposes. This allowed for placement of a 5-French vascular sheath. A limited arteriogram was performed through the side arm of the sheath confirming appropriate access within the right common femoral artery. The  5-French  Mickelson catheter was advanced to the level of the thoracic aorta where it was performed, back bled and flushed. The Mickelson catheter was then utilized to select the left renal artery and a left renal arteriogram was performed. Next, with the use of a fathom 14 microwire, a cantata microcatheter was utilized to select the inferior division the left renal artery and a sub selective arteriogram was performed The microcatheter was then utilized to select the inferolateral division of the left renal artery and a sub selective arteriogram was performed. Next, the vessel was embolized with approximally 1/5 vial of 100-300 micron Embospheres. Once vessel stasis was achieved, the vessel was embolized with overlapping 4 mm and 2 mm interlock coils. The microcatheter was partially retracted and a post embolization arteriogram was performed. The microcatheter was then utilized to select the inferior medial division of the left renal artery and a sub selective arteriogram was performed. Next, the vessel was embolized with approximally 1/5 vial of 103 100 micron Embospheres. Again, once vessel stasis was achieved, the vessel was embolized with overlapping 4 mm and 2 mm interlock coils. The microcatheter was partially retracted and a post embolization arteriogram was performed. The microcatheter was then removed and a completion arteriogram was performed via the [facility] catheter. Images reviewed and the procedure was terminated. All wires, catheters and sheaths were removed the patient. Hemostasis was achieved at the right groin access site with deployment of a Celt closure device. Postprocedural spot fluoroscopic image demonstrates appropriate positioning of the puncture location at the level of the humeral head. A dressing was applied. The patient tolerated the procedure well without immediate post procedural complication. FINDINGS: Selective right renal arteriogram demonstrates a fairly well-defined partially exophytic  hypervascular lesion arising from the inferior pole of the left kidney supplied by both the inferolateral and inferior medial divisions of the left renal artery, as was suggested on preceding CTA. Following percutaneous particle and coil embolization of both the inferolateral and inferior medial divisions of the left renal artery, completion arteriogram demonstrates no residual flow to the hypervascular renal mass with preserved arterial supply to the remainder of the renal parenchyma. IMPRESSION: Technically successful percutaneous particle and coil embolization of both the inferomedial and inferolateral divisions of the left renal artery supplying the known partially exophytic hypervascular left-sided RCC. PLAN: Patient was then escorted to CT for image guided microwave ablation of the left-sided renal mass. Electronically Signed   By: Sandi Mariscal M.D.   On: 08/22/2021 11:22     TIMI Risk Score for Unstable Angina or Non-ST Elevation MI:   The patient's TIMI risk score is 3, which indicates a 13% risk of all cause mortality, new or recurrent myocardial infarction or need for urgent revascularization in the next 14 days.{  Assessment and Plan:   NSTEMI AF/RVR Kim Burns presented following an episode of very severe chest pain and was found to be in AF/RVR with uncontrolled ventricular rates.  Initially upon her symptoms were probably related to newly diagnosed AF/RVR however her son-in-law who works for the fire department had checked her pulse during the time for symptom onset and did not note significant tachycardia or irregularity.  She seems to still be maintaining a fairly independent quality of life although does need some assistance.  Her troponin elevation was fairly significant for AF/RVR although the troponin elevation by itself may have been related to significantly elevated ventricular rates.  She was otherwise asymptomatic however in the emergency department when her rates were in the  180s and  had no significant chest discomfort at rest.  This makes it more difficult to explain her severe chest pain today prior when she was not in uncontrolled AF/RVR.  She has risk factors for coronary disease and elevated troponin I think it is worth coronary angiography to exclude obstructive disease.  With regards to her Howard her family said that the discussions up to this point were that her RCC was fairly slow-growing and that it was expected that she likely had years left before the [place] was an issue.  She was not thought to be a surgical candidate however given comorbidities and prior Whipple procedure and proceeded microwave ablation this week.  She does have a history of GI bleed which was presumed to be UGIB in 2018 with EGD showing a clean based anastomotic ulcer.  She has not had recurrent bleeding events since that time.  She does have chronic anemia that has been fairly stable (Hb 10.7).  Although she is not an ideal candidate for long-term DAPT I do think that she could not tolerate a year of clopidogrel/apixaban followed by reduced dose apixaban (ESRD, 38F, 56.2 kg) if she had significant obstructive disease.  Tentatively plan for possible coronary angiography on Monday if she tolerates heparin with a stable hemoglobin.  - heparin gtt for both ACS and new onset AF - s/p ASA 324 mg PO x1, start ASA 81 mg daily  - on dilt gtt for rate control  - will discuss statin further with her, several intolerances listed, recent lipids within reasonable range  - TTE 07/09  - possible coronary angiography Monday  For questions or updates, please contact [facility] Please consult [email or web address] for contact info under   Signed, Dion Body, MD  08/26/2021 7:00 AM

## 2021-08-25 NOTE — Assessment & Plan Note (Signed)
S/p ablation on 7/5.  Also had coil embolization. Okay per Dr. Pascal Lux to do anticoagulation (for a.fib).

## 2021-08-25 NOTE — Assessment & Plan Note (Signed)
Felt to be due to DM2 and HTN.  On dialysis since Jan. 1. Call nephro in AM

## 2021-08-25 NOTE — ED Notes (Addendum)
Critical Troponin 1098. MD Alvino Chapel made aware.

## 2021-08-26 DIAGNOSIS — E213 Hyperparathyroidism, unspecified: Secondary | ICD-10-CM | POA: Diagnosis present

## 2021-08-26 DIAGNOSIS — I6349 Cerebral infarction due to embolism of other cerebral artery: Secondary | ICD-10-CM | POA: Diagnosis not present

## 2021-08-26 DIAGNOSIS — F419 Anxiety disorder, unspecified: Secondary | ICD-10-CM | POA: Diagnosis present

## 2021-08-26 DIAGNOSIS — Z992 Dependence on renal dialysis: Secondary | ICD-10-CM

## 2021-08-26 DIAGNOSIS — G4730 Sleep apnea, unspecified: Secondary | ICD-10-CM | POA: Diagnosis present

## 2021-08-26 DIAGNOSIS — R911 Solitary pulmonary nodule: Secondary | ICD-10-CM

## 2021-08-26 DIAGNOSIS — D490 Neoplasm of unspecified behavior of digestive system: Secondary | ICD-10-CM | POA: Diagnosis not present

## 2021-08-26 DIAGNOSIS — I214 Non-ST elevation (NSTEMI) myocardial infarction: Secondary | ICD-10-CM

## 2021-08-26 DIAGNOSIS — E1122 Type 2 diabetes mellitus with diabetic chronic kidney disease: Secondary | ICD-10-CM | POA: Diagnosis present

## 2021-08-26 DIAGNOSIS — R778 Other specified abnormalities of plasma proteins: Secondary | ICD-10-CM | POA: Diagnosis not present

## 2021-08-26 DIAGNOSIS — I480 Paroxysmal atrial fibrillation: Secondary | ICD-10-CM | POA: Diagnosis present

## 2021-08-26 DIAGNOSIS — H539 Unspecified visual disturbance: Secondary | ICD-10-CM | POA: Diagnosis not present

## 2021-08-26 DIAGNOSIS — C642 Malignant neoplasm of left kidney, except renal pelvis: Secondary | ICD-10-CM | POA: Diagnosis not present

## 2021-08-26 DIAGNOSIS — I120 Hypertensive chronic kidney disease with stage 5 chronic kidney disease or end stage renal disease: Secondary | ICD-10-CM | POA: Diagnosis present

## 2021-08-26 DIAGNOSIS — I4891 Unspecified atrial fibrillation: Secondary | ICD-10-CM | POA: Diagnosis not present

## 2021-08-26 DIAGNOSIS — Z9842 Cataract extraction status, left eye: Secondary | ICD-10-CM | POA: Diagnosis not present

## 2021-08-26 DIAGNOSIS — Z8673 Personal history of transient ischemic attack (TIA), and cerebral infarction without residual deficits: Secondary | ICD-10-CM | POA: Diagnosis not present

## 2021-08-26 DIAGNOSIS — E559 Vitamin D deficiency, unspecified: Secondary | ICD-10-CM | POA: Diagnosis present

## 2021-08-26 DIAGNOSIS — T82510A Breakdown (mechanical) of surgically created arteriovenous fistula, initial encounter: Secondary | ICD-10-CM | POA: Diagnosis not present

## 2021-08-26 DIAGNOSIS — I21A1 Myocardial infarction type 2: Secondary | ICD-10-CM | POA: Diagnosis present

## 2021-08-26 DIAGNOSIS — I953 Hypotension of hemodialysis: Secondary | ICD-10-CM | POA: Diagnosis not present

## 2021-08-26 DIAGNOSIS — Z794 Long term (current) use of insulin: Secondary | ICD-10-CM

## 2021-08-26 DIAGNOSIS — Y92239 Unspecified place in hospital as the place of occurrence of the external cause: Secondary | ICD-10-CM | POA: Diagnosis not present

## 2021-08-26 DIAGNOSIS — T82838A Hemorrhage of vascular prosthetic devices, implants and grafts, initial encounter: Secondary | ICD-10-CM | POA: Diagnosis not present

## 2021-08-26 DIAGNOSIS — D631 Anemia in chronic kidney disease: Secondary | ICD-10-CM | POA: Diagnosis present

## 2021-08-26 DIAGNOSIS — Y828 Other medical devices associated with adverse incidents: Secondary | ICD-10-CM | POA: Diagnosis not present

## 2021-08-26 DIAGNOSIS — Z960 Presence of urogenital implants: Secondary | ICD-10-CM | POA: Diagnosis present

## 2021-08-26 DIAGNOSIS — N186 End stage renal disease: Secondary | ICD-10-CM

## 2021-08-26 DIAGNOSIS — I2510 Atherosclerotic heart disease of native coronary artery without angina pectoris: Secondary | ICD-10-CM | POA: Diagnosis present

## 2021-08-26 DIAGNOSIS — K219 Gastro-esophageal reflux disease without esophagitis: Secondary | ICD-10-CM | POA: Diagnosis present

## 2021-08-26 DIAGNOSIS — I639 Cerebral infarction, unspecified: Secondary | ICD-10-CM | POA: Diagnosis not present

## 2021-08-26 DIAGNOSIS — R079 Chest pain, unspecified: Secondary | ICD-10-CM | POA: Diagnosis not present

## 2021-08-26 DIAGNOSIS — Y712 Prosthetic and other implants, materials and accessory cardiovascular devices associated with adverse incidents: Secondary | ICD-10-CM | POA: Diagnosis not present

## 2021-08-26 DIAGNOSIS — N2581 Secondary hyperparathyroidism of renal origin: Secondary | ICD-10-CM | POA: Diagnosis present

## 2021-08-26 DIAGNOSIS — G8191 Hemiplegia, unspecified affecting right dominant side: Secondary | ICD-10-CM | POA: Diagnosis not present

## 2021-08-26 LAB — CBG MONITORING, ED
Glucose-Capillary: 121 mg/dL — ABNORMAL HIGH (ref 70–99)
Glucose-Capillary: 158 mg/dL — ABNORMAL HIGH (ref 70–99)

## 2021-08-26 LAB — HEPARIN LEVEL (UNFRACTIONATED)
Heparin Unfractionated: <0.1 IU/mL — ABNORMAL LOW (ref 0.30–0.70)
Heparin Unfractionated: <0.1 IU/mL — ABNORMAL LOW (ref 0.30–0.70)
Heparin Unfractionated: <0.1 IU/mL — ABNORMAL LOW (ref 0.30–0.70)

## 2021-08-26 LAB — BASIC METABOLIC PANEL
Anion gap: 15 (ref 5–15)
BUN: 34 mg/dL — ABNORMAL HIGH (ref 8–23)
CO2: 24 mmol/L (ref 22–32)
Calcium: 8.4 mg/dL — ABNORMAL LOW (ref 8.9–10.3)
Chloride: 93 mmol/L — ABNORMAL LOW (ref 98–111)
Creatinine, Ser: 3.87 mg/dL — ABNORMAL HIGH (ref 0.44–1.00)
GFR, Estimated: 11 mL/min — ABNORMAL LOW (ref >60)
Glucose, Bld: 166 mg/dL — ABNORMAL HIGH (ref 70–99)
Potassium: 4 mmol/L (ref 3.5–5.1)
Sodium: 132 mmol/L — ABNORMAL LOW (ref 135–145)

## 2021-08-26 LAB — CBC
HCT: 28.4 % — ABNORMAL LOW (ref 36.0–46.0)
Hemoglobin: 8.9 g/dL — ABNORMAL LOW (ref 12.0–15.0)
MCH: 28.4 pg (ref 26.0–34.0)
MCHC: 31.3 g/dL (ref 30.0–36.0)
MCV: 90.7 fL (ref 80.0–100.0)
Platelets: 207 10*3/uL (ref 150–400)
RBC: 3.13 MIL/uL — ABNORMAL LOW (ref 3.87–5.11)
RDW: 16 % — ABNORMAL HIGH (ref 11.5–15.5)
WBC: 9.6 10*3/uL (ref 4.0–10.5)
nRBC: 0 % (ref 0.0–0.2)

## 2021-08-26 LAB — LIPID PANEL
Cholesterol: 95 mg/dL (ref 0–200)
HDL: 35 mg/dL — ABNORMAL LOW (ref >40)
LDL Cholesterol: 38 mg/dL (ref 0–99)
Total CHOL/HDL Ratio: 2.7 RATIO
Triglycerides: 110 mg/dL (ref <150)
VLDL: 22 mg/dL (ref 0–40)

## 2021-08-26 LAB — LACTIC ACID, PLASMA
Lactic Acid, Venous: 1.5 mmol/L (ref 0.5–1.9)
Lactic Acid, Venous: 1 mmol/L (ref 0.5–1.9)

## 2021-08-26 MED ORDER — SODIUM CHLORIDE 0.9 % WEIGHT BASED INFUSION: 10.0000 mL/h | INTRAVENOUS | Status: DC

## 2021-08-26 MED ORDER — SODIUM CHLORIDE 0.9% FLUSH: 3.0000 mL | INTRAVENOUS | Status: DC | PRN

## 2021-08-26 MED ORDER — ASPIRIN 81 MG PO TBEC
81.0000 mg | DELAYED_RELEASE_TABLET | Freq: Every day | ORAL | Status: DC
Start: 2021-08-27 — End: 2021-08-27
  Administered 2021-08-27: 81 mg via ORAL
  Filled 2021-08-26: qty 1

## 2021-08-26 MED ORDER — NITROGLYCERIN 0.4 MG SL SUBL: 0.4000 mg | SUBLINGUAL_TABLET | SUBLINGUAL | Status: DC | PRN

## 2021-08-26 MED ORDER — PANCRELIPASE (LIP-PROT-AMYL) 12000-38000 UNITS PO CPEP
36000.0000 [IU] | ORAL_CAPSULE | ORAL | Status: DC
  Administered 2021-08-26 – 2021-08-29 (×4): 36000 [IU] via ORAL
  Filled 2021-08-26 (×4): qty 3

## 2021-08-26 MED ORDER — ESCITALOPRAM OXALATE 10 MG PO TABS
20.0000 mg | ORAL_TABLET | Freq: Every day | ORAL | Status: DC
  Administered 2021-08-26 – 2021-08-30 (×5): 20 mg via ORAL
  Filled 2021-08-26 (×5): qty 2

## 2021-08-26 MED ORDER — HEPARIN BOLUS VIA INFUSION
2000.0000 [IU] | Freq: Once | INTRAVENOUS | Status: AC
  Administered 2021-08-26: 2000 [IU] via INTRAVENOUS
  Filled 2021-08-26: qty 2000

## 2021-08-26 MED ORDER — SEVELAMER CARBONATE 800 MG PO TABS
1600.0000 mg | ORAL_TABLET | Freq: Three times a day (TID) | ORAL | Status: DC
  Administered 2021-08-26 – 2021-08-30 (×10): 1600 mg via ORAL
  Filled 2021-08-26 (×10): qty 2

## 2021-08-26 MED ORDER — OXYCODONE-ACETAMINOPHEN 5-325 MG PO TABS: 1.0000 | ORAL_TABLET | Freq: Four times a day (QID) | ORAL | Status: DC | PRN

## 2021-08-26 MED ORDER — ASPIRIN 300 MG RE SUPP
300.0000 mg | RECTAL | Status: AC
Start: 2021-08-26 — End: 2021-08-26

## 2021-08-26 MED ORDER — ASPIRIN 81 MG PO CHEW
324.0000 mg | CHEWABLE_TABLET | ORAL | Status: AC
  Administered 2021-08-26: 324 mg via ORAL
  Filled 2021-08-26: qty 4

## 2021-08-26 MED ORDER — PANCRELIPASE (LIP-PROT-AMYL) 12000-38000 UNITS PO CPEP
72000.0000 [IU] | ORAL_CAPSULE | Freq: Three times a day (TID) | ORAL | Status: DC
  Administered 2021-08-26 – 2021-08-30 (×9): 72000 [IU] via ORAL
  Filled 2021-08-26 (×9): qty 6

## 2021-08-26 MED ORDER — MIDODRINE HCL 5 MG PO TABS
10.0000 mg | ORAL_TABLET | ORAL | Status: DC
Start: 2021-08-27 — End: 2021-08-31
  Administered 2021-08-27 – 2021-08-30 (×2): 10 mg via ORAL
  Filled 2021-08-26 (×2): qty 2

## 2021-08-26 MED ORDER — ASPIRIN 81 MG PO CHEW
81.0000 mg | CHEWABLE_TABLET | ORAL | Status: AC
  Administered 2021-08-27: 81 mg via ORAL
  Filled 2021-08-26: qty 1

## 2021-08-26 MED ORDER — SODIUM CHLORIDE 0.9 % IV BOLUS
500.0000 mL | Freq: Once | INTRAVENOUS | Status: AC
Start: 2021-08-26 — End: 2021-08-26
  Administered 2021-08-26: 500 mL via INTRAVENOUS

## 2021-08-26 MED ORDER — ACETAMINOPHEN 325 MG PO TABS
650.0000 mg | ORAL_TABLET | ORAL | Status: DC | PRN
  Administered 2021-08-30: 650 mg via ORAL
  Filled 2021-08-26: qty 2

## 2021-08-26 MED ORDER — PANCRELIPASE (LIP-PROT-AMYL) 12000-38000 UNITS PO CPEP: 36000.0000 [IU] | ORAL_CAPSULE | ORAL | Status: DC

## 2021-08-26 MED ORDER — SODIUM CHLORIDE 0.9% FLUSH
3.0000 mL | Freq: Two times a day (BID) | INTRAVENOUS | Status: DC
Start: 2021-08-26 — End: 2021-08-27

## 2021-08-26 MED ORDER — POLYETHYLENE GLYCOL 3350 17 G PO PACK
17.0000 g | PACK | Freq: Every day | ORAL | Status: DC
  Administered 2021-08-26 – 2021-08-27 (×2): 17 g via ORAL
  Filled 2021-08-26 (×2): qty 1

## 2021-08-26 MED ORDER — ONDANSETRON HCL 4 MG/2ML IJ SOLN: 4.0000 mg | Freq: Four times a day (QID) | INTRAMUSCULAR | Status: DC | PRN

## 2021-08-26 MED ORDER — SODIUM CHLORIDE 0.9 % IV SOLN: 250.0000 mL | INTRAVENOUS | Status: DC | PRN

## 2021-08-26 MED ORDER — INSULIN ASPART 100 UNIT/ML IJ SOLN
0.0000 [IU] | Freq: Three times a day (TID) | INTRAMUSCULAR | Status: DC
  Administered 2021-08-26: 2 [IU] via SUBCUTANEOUS

## 2021-08-26 NOTE — Progress Notes (Signed)
Overnight progress note  Notified by RN that patient removed a tick from her left ankle, unknown how long it was there.  No rash noted.   -Unable to give antimicrobial prophylaxis with doxycycline as she is allergic to this drug. -Continue to monitor closely

## 2021-08-26 NOTE — ED Notes (Addendum)
Admitting provider made aware of patient bp's running in the 18s. Cardizem is paused. Awaiting new orders

## 2021-08-26 NOTE — ED Notes (Signed)
Admitting provider updated about patient new hr and rhythm. EKG captured and sent.

## 2021-08-26 NOTE — Assessment & Plan Note (Addendum)
Significant elevation in high sensitive troponin.  Follow up EKG with anterior septal T wave inversions (new changes).  Plan to continue pain control with analgesics Continue anticoagulation with heparin and plan for further work up cardiac catheterization and coronary angiography.

## 2021-08-26 NOTE — Progress Notes (Signed)
Progress Note  Patient Name: Kim Burns Date of Encounter: 08/26/2021  [facility] Cardiologist: Peter Martinique, MD   Subjective   No further chest discomfort.  Previously felt chest left-sided lower radiating to her left abdomen.  Inpatient Medications    Scheduled Meds:  [START ON 08/27/2021] aspirin EC  81 mg Oral Daily   insulin aspart  0-9 Units Subcutaneous TID WC   Continuous Infusions:  diltiazem (CARDIZEM) infusion 0 mg/hr (08/26/21 0744)   heparin 1,000 Units/hr (08/26/21 1032)   PRN Meds: acetaminophen, ondansetron (ZOFRAN) IV   Vital Signs    Vitals:   08/26/21 0730 08/26/21 0745 08/26/21 1045 08/26/21 1115  BP: 98/62 101/61 115/71 126/74  Pulse: 71 71 72 73  Resp: (!) 22 (!) 24 (!) 23 (!) 25  Temp:      TempSrc:      SpO2: 98% 98% 95% 95%  Weight:      Height:        Intake/Output Summary (Last 24 hours) at 08/26/2021 1156 Last data filed at 08/25/2021 2216 Gross per 24 hour  Intake 500 ml  Output --  Net 500 ml      08/25/2021    7:25 PM 08/22/2021    6:58 PM 08/22/2021    7:57 AM  Last 3 Weights  Weight (lbs) 124 lb 124 lb 124 lb  Weight (kg) 56.246 kg 56.246 kg 56.246 kg      Telemetry    A-fib with RVR converted to sinus rhythm/sinus bradycardia.- Personally Reviewed  ECG    Sinus rhythm subtle ST segment elevation in inferior leads T wave inversion V3.- Personally Reviewed  Physical Exam   GEN: No acute distress.   Neck: No JVD Cardiac: RRR, no murmurs, rubs, or gallops.  Respiratory: Clear to auscultation bilaterally. GI: Soft, nontender, non-distended  MS: No edema; No deformity. Neuro:  Nonfocal  Psych: Normal affect   Labs    High Sensitivity Troponin:   Recent Labs  Lab 08/25/21 1917 08/25/21 2118  TROPONINIHS 1,098* 925*     Chemistry Recent Labs  Lab 08/22/21 0740 08/25/21 1917 08/26/21 0447  NA 138 134* 132*  K 3.8 3.9 4.0  CL 98 89* 93*  CO2 '29 27 24  '$ GLUCOSE 148* 180* 166*  BUN 31* 27* 34*   CREATININE 3.03* 3.63* 3.87*  CALCIUM 9.1 8.7* 8.4*  PROT  --  6.6  --   ALBUMIN  --  2.9*  --   AST  --  21  --   ALT  --  23  --   ALKPHOS  --  87  --   BILITOT  --  0.8  --   GFRNONAA 15* 12* 11*  ANIONGAP 11 18* 15    Lipids  Recent Labs  Lab 08/26/21 0447  CHOL 95  TRIG 110  HDL 35*  LDLCALC 38  CHOLHDL 2.7    Hematology Recent Labs  Lab 08/22/21 0740 08/25/21 1917 08/26/21 0447  WBC 7.1 13.1* 9.6  RBC 4.15 3.66* 3.13*  HGB 11.9* 10.7* 8.9*  HCT 38.5 33.2* 28.4*  MCV 92.8 90.7 90.7  MCH 28.7 29.2 28.4  MCHC 30.9 32.2 31.3  RDW 15.4 15.8* 16.0*  PLT 227 219 207   Thyroid No results for input(s): "TSH", "FREET4" in the last 168 hours.  BNPNo results for input(s): "BNP", "PROBNP" in the last 168 hours.  DDimer No results for input(s): "DDIMER" in the last 168 hours.   Radiology    CT CHEST  ABDOMEN PELVIS W CONTRAST  Result Date: 08/25/2021 CLINICAL DATA:  History of recent renal ablation with tachycardia, initial encounter EXAM: CT CHEST, ABDOMEN, AND PELVIS WITH CONTRAST TECHNIQUE: Multidetector CT imaging of the chest, abdomen and pelvis was performed following the standard protocol during bolus administration of intravenous contrast. RADIATION DOSE REDUCTION: This exam was performed according to the departmental dose-optimization program which includes automated exposure control, adjustment of the mA and/or kV according to patient size and/or use of iterative reconstruction technique. CONTRAST:  83m OMNIPAQUE IOHEXOL 300 MG/ML  SOLN COMPARISON:  None Available. FINDINGS: CT CHEST FINDINGS Cardiovascular: Scattered atherosclerotic calcifications are noted. No aneurysmal dilatation or dissection is noted. No cardiac enlargement is seen. The pulmonary artery shows a normal branching pattern bilaterally. No filling defect to suggest pulmonary embolism is noted. Scattered coronary calcifications are noted. Mediastinum/Nodes: Thoracic inlet is within normal limits. No  hilar or mediastinal adenopathy is noted. The esophagus is within normal limits. Lungs/Pleura: Lungs are well aerated bilaterally. No focal infiltrate or sizable effusion is noted. Minimal scarring is noted in the right base. Tiny less than 4 mm nodule is noted in the superior segment of the left lower lobe. No other nodules are noted. Musculoskeletal: Degenerative changes of the thoracic spine are noted. No rib abnormality is noted. CT ABDOMEN PELVIS FINDINGS Hepatobiliary: No focal liver abnormality is seen. Status post cholecystectomy. No biliary dilatation. Pancreas: Postsurgical changes are noted consistent with prior Whipple. The residual pancreas is atrophic. Spleen: Scattered cysts are noted within the spleen. Adrenals/Urinary Tract: Adrenal glands are within normal limits. Kidneys demonstrate multiple hypodense lesions throughout both kidneys. A mildly enhancing solid lesion is noted in the medial aspect of the left lower pole. This has undergone recent ablation and coil embolization. No new focal abnormality is noted. No obstructive changes are seen. The bladder is decompressed. Stomach/Bowel: No obstructive or inflammatory changes of the colon are seen. Postsurgical changes are noted consistent with the prior Whipple procedure. The appendix is not well visualized and may have been surgically removed. No inflammatory changes are seen. Visualized small bowel is within normal limits aside from the previously described surgical change. Vascular/Lymphatic: Aortic atherosclerosis. No enlarged abdominal or pelvic lymph nodes. Reproductive: Status post hysterectomy. No adnexal masses. Other: No abdominal wall hernia or abnormality. No abdominopelvic ascites. Musculoskeletal: Degenerative changes of lumbar spine are noted. No acute bony abnormality is noted. IMPRESSION: CT of the chest: No evidence of pulmonary emboli. 3 mm left solid pulmonary nodule. No routine follow-up imaging is recommended per Fleischner  Society Guidelines. These guidelines do not apply to immunocompromised patients and patients with cancer. Follow up in patients with significant comorbidities as clinically warranted. For lung cancer screening, adhere to Lung-RADS guidelines. Reference: Radiology. 2017; 284(1):228-43. CT of the abdomen and pelvis: Changes consistent with prior Whipple procedure. Stable solid mass in the medial aspect of the left kidney. This has recently undergone ablation as well as coil embolization. No acute abnormality noted. Electronically Signed   By: MInez CatalinaM.D.   On: 08/25/2021 22:06    Cardiac Studies   Echo 08/08/2021-EF 60 to 65%.  Repeating echo.  Patient Profile     86y.o. female here with end-stage renal disease, prior Whipple procedure 2014, A-fib paroxysmal with rapid ventricular response, chest pain, non-ST elevation myocardial infarction with troponin of 1000.  Assessment & Plan    Non-ST elevation myocardial infarction - She has converted to sinus rhythm.  There is subtle ST elevation less than 1 mm in the  inferior leads.  There are T wave inversions in the anterior precordial leads. - Agree with IV heparin.  Cardiac catheterization tomorrow. -Aspirin 81 mg. -Continue to encourage statin use.  Chronic anemia - Hemoglobin 10.7.  Stable.  No evidence of bleeding.  Monitor for any signs.  Paroxysmal atrial fibrillation - Now back in normal sinus rhythm.  Auto converted. - We will need to convert her to low-dose Eliquis 2.5 mg twice a day given her age, weight of 56 kg, end-stage renal disease status.  If PCI is needed, she will need short-term triple therapy.  CRITICAL CARE Performed by: Candee Furbish   Total critical care time: 35 minutes  Critical care time was exclusive of separately billable procedures and treating other patients.  Critical care was necessary to treat or prevent imminent or life-threatening deterioration.  Critical care was time spent personally by me on the  following activities: development of treatment plan with patient and/or surrogate as well as nursing, discussions with consultants, evaluation of patient's response to treatment, examination of patient, obtaining history from patient or surrogate, ordering and performing treatments and interventions, ordering and review of laboratory studies, ordering and review of radiographic studies, pulse oximetry and re-evaluation of patient's condition.   For questions or updates, please contact [place] Please consult [email or web address] for contact info under        Signed, Candee Furbish, MD  08/26/2021, 11:56 AM

## 2021-08-26 NOTE — Progress Notes (Signed)
Pt converted to sinus brady with rate of 57.  BP also hypotensive with SBP 73.  Cardizem paused.  500cc IVF bolus ordered to see if she responds.

## 2021-08-26 NOTE — H&P (View-Only) (Signed)
Progress Note  Patient Name: Kim Burns Date of Encounter: 08/26/2021  [facility] Cardiologist: Peter Martinique, MD   Subjective   No further chest discomfort.  Previously felt chest left-sided lower radiating to her left abdomen.  Inpatient Medications    Scheduled Meds:  [START ON 08/27/2021] aspirin EC  81 mg Oral Daily   insulin aspart  0-9 Units Subcutaneous TID WC   Continuous Infusions:  diltiazem (CARDIZEM) infusion 0 mg/hr (08/26/21 0744)   heparin 1,000 Units/hr (08/26/21 1032)   PRN Meds: acetaminophen, ondansetron (ZOFRAN) IV   Vital Signs    Vitals:   08/26/21 0730 08/26/21 0745 08/26/21 1045 08/26/21 1115  BP: 98/62 101/61 115/71 126/74  Pulse: 71 71 72 73  Resp: (!) 22 (!) 24 (!) 23 (!) 25  Temp:      TempSrc:      SpO2: 98% 98% 95% 95%  Weight:      Height:        Intake/Output Summary (Last 24 hours) at 08/26/2021 1156 Last data filed at 08/25/2021 2216 Gross per 24 hour  Intake 500 ml  Output --  Net 500 ml      08/25/2021    7:25 PM 08/22/2021    6:58 PM 08/22/2021    7:57 AM  Last 3 Weights  Weight (lbs) 124 lb 124 lb 124 lb  Weight (kg) 56.246 kg 56.246 kg 56.246 kg      Telemetry    A-fib with RVR converted to sinus rhythm/sinus bradycardia.- Personally Reviewed  ECG    Sinus rhythm subtle ST segment elevation in inferior leads T wave inversion V3.- Personally Reviewed  Physical Exam   GEN: No acute distress.   Neck: No JVD Cardiac: RRR, no murmurs, rubs, or gallops.  Respiratory: Clear to auscultation bilaterally. GI: Soft, nontender, non-distended  MS: No edema; No deformity. Neuro:  Nonfocal  Psych: Normal affect   Labs    High Sensitivity Troponin:   Recent Labs  Lab 08/25/21 1917 08/25/21 2118  TROPONINIHS 1,098* 925*     Chemistry Recent Labs  Lab 08/22/21 0740 08/25/21 1917 08/26/21 0447  NA 138 134* 132*  K 3.8 3.9 4.0  CL 98 89* 93*  CO2 '29 27 24  '$ GLUCOSE 148* 180* 166*  BUN 31* 27* 34*   CREATININE 3.03* 3.63* 3.87*  CALCIUM 9.1 8.7* 8.4*  PROT  --  6.6  --   ALBUMIN  --  2.9*  --   AST  --  21  --   ALT  --  23  --   ALKPHOS  --  87  --   BILITOT  --  0.8  --   GFRNONAA 15* 12* 11*  ANIONGAP 11 18* 15    Lipids  Recent Labs  Lab 08/26/21 0447  CHOL 95  TRIG 110  HDL 35*  LDLCALC 38  CHOLHDL 2.7    Hematology Recent Labs  Lab 08/22/21 0740 08/25/21 1917 08/26/21 0447  WBC 7.1 13.1* 9.6  RBC 4.15 3.66* 3.13*  HGB 11.9* 10.7* 8.9*  HCT 38.5 33.2* 28.4*  MCV 92.8 90.7 90.7  MCH 28.7 29.2 28.4  MCHC 30.9 32.2 31.3  RDW 15.4 15.8* 16.0*  PLT 227 219 207   Thyroid No results for input(s): "TSH", "FREET4" in the last 168 hours.  BNPNo results for input(s): "BNP", "PROBNP" in the last 168 hours.  DDimer No results for input(s): "DDIMER" in the last 168 hours.   Radiology    CT CHEST  ABDOMEN PELVIS W CONTRAST  Result Date: 08/25/2021 CLINICAL DATA:  History of recent renal ablation with tachycardia, initial encounter EXAM: CT CHEST, ABDOMEN, AND PELVIS WITH CONTRAST TECHNIQUE: Multidetector CT imaging of the chest, abdomen and pelvis was performed following the standard protocol during bolus administration of intravenous contrast. RADIATION DOSE REDUCTION: This exam was performed according to the departmental dose-optimization program which includes automated exposure control, adjustment of the mA and/or kV according to patient size and/or use of iterative reconstruction technique. CONTRAST:  78m OMNIPAQUE IOHEXOL 300 MG/ML  SOLN COMPARISON:  None Available. FINDINGS: CT CHEST FINDINGS Cardiovascular: Scattered atherosclerotic calcifications are noted. No aneurysmal dilatation or dissection is noted. No cardiac enlargement is seen. The pulmonary artery shows a normal branching pattern bilaterally. No filling defect to suggest pulmonary embolism is noted. Scattered coronary calcifications are noted. Mediastinum/Nodes: Thoracic inlet is within normal limits. No  hilar or mediastinal adenopathy is noted. The esophagus is within normal limits. Lungs/Pleura: Lungs are well aerated bilaterally. No focal infiltrate or sizable effusion is noted. Minimal scarring is noted in the right base. Tiny less than 4 mm nodule is noted in the superior segment of the left lower lobe. No other nodules are noted. Musculoskeletal: Degenerative changes of the thoracic spine are noted. No rib abnormality is noted. CT ABDOMEN PELVIS FINDINGS Hepatobiliary: No focal liver abnormality is seen. Status post cholecystectomy. No biliary dilatation. Pancreas: Postsurgical changes are noted consistent with prior Whipple. The residual pancreas is atrophic. Spleen: Scattered cysts are noted within the spleen. Adrenals/Urinary Tract: Adrenal glands are within normal limits. Kidneys demonstrate multiple hypodense lesions throughout both kidneys. A mildly enhancing solid lesion is noted in the medial aspect of the left lower pole. This has undergone recent ablation and coil embolization. No new focal abnormality is noted. No obstructive changes are seen. The bladder is decompressed. Stomach/Bowel: No obstructive or inflammatory changes of the colon are seen. Postsurgical changes are noted consistent with the prior Whipple procedure. The appendix is not well visualized and may have been surgically removed. No inflammatory changes are seen. Visualized small bowel is within normal limits aside from the previously described surgical change. Vascular/Lymphatic: Aortic atherosclerosis. No enlarged abdominal or pelvic lymph nodes. Reproductive: Status post hysterectomy. No adnexal masses. Other: No abdominal wall hernia or abnormality. No abdominopelvic ascites. Musculoskeletal: Degenerative changes of lumbar spine are noted. No acute bony abnormality is noted. IMPRESSION: CT of the chest: No evidence of pulmonary emboli. 3 mm left solid pulmonary nodule. No routine follow-up imaging is recommended per Fleischner  Society Guidelines. These guidelines do not apply to immunocompromised patients and patients with cancer. Follow up in patients with significant comorbidities as clinically warranted. For lung cancer screening, adhere to Lung-RADS guidelines. Reference: Radiology. 2017; 284(1):228-43. CT of the abdomen and pelvis: Changes consistent with prior Whipple procedure. Stable solid mass in the medial aspect of the left kidney. This has recently undergone ablation as well as coil embolization. No acute abnormality noted. Electronically Signed   By: MInez CatalinaM.D.   On: 08/25/2021 22:06    Cardiac Studies   Echo 08/08/2021-EF 60 to 65%.  Repeating echo.  Patient Profile     86y.o. female here with end-stage renal disease, prior Whipple procedure 2014, A-fib paroxysmal with rapid ventricular response, chest pain, non-ST elevation myocardial infarction with troponin of 1000.  Assessment & Plan    Non-ST elevation myocardial infarction - She has converted to sinus rhythm.  There is subtle ST elevation less than 1 mm in the  inferior leads.  There are T wave inversions in the anterior precordial leads. - Agree with IV heparin.  Cardiac catheterization tomorrow. -Aspirin 81 mg. -Continue to encourage statin use.  Chronic anemia - Hemoglobin 10.7.  Stable.  No evidence of bleeding.  Monitor for any signs.  Paroxysmal atrial fibrillation - Now back in normal sinus rhythm.  Auto converted. - We will need to convert her to low-dose Eliquis 2.5 mg twice a day given her age, weight of 56 kg, end-stage renal disease status.  If PCI is needed, she will need short-term triple therapy.  CRITICAL CARE Performed by: Candee Furbish   Total critical care time: 35 minutes  Critical care time was exclusive of separately billable procedures and treating other patients.  Critical care was necessary to treat or prevent imminent or life-threatening deterioration.  Critical care was time spent personally by me on the  following activities: development of treatment plan with patient and/or surrogate as well as nursing, discussions with consultants, evaluation of patient's response to treatment, examination of patient, obtaining history from patient or surrogate, ordering and performing treatments and interventions, ordering and review of laboratory studies, ordering and review of radiographic studies, pulse oximetry and re-evaluation of patient's condition.   For questions or updates, please contact [place] Please consult [email or web address] for contact info under        Signed, Candee Furbish, MD  08/26/2021, 11:56 AM

## 2021-08-26 NOTE — Progress Notes (Addendum)
Progress Note   Patient: Kim Burns PJA:[id number] DOB: 11/22/1935 DOA: 08/25/2021     0 DOS: the patient was seen and examined on 08/26/2021   Brief hospital course: Mrs. Umana was admitted to the hospital with the working diagnosis of atrial fibrillation with RVR.   86 yo female with the past medical history of ESRD on HD, IPMN sp whipple procedure 2018, renal cell carcinoma sp ablation and embolization per IR on 08/22/21. The night prior to admission she had severe chest pain, that persisted over the next morning. Associated with palpitations and was worse with exertion. On her initial physical examination her HR was 180 bpm, down to 130 on diltiazem infusion, blood pressure 111/77, RR 16 and 02 saturation 98%, lungs with no wheezing or rales, heart with S1 and S2 present irregularly irregular with no gallops, abdomen not distended and no lower extremity edema.   Na 134, K 3,9 Cl 89, bicarbonate 27 glucose 180, bun 27 cr 3,63  High sensitive troponin 1,098-925  Wbc 13,1 hgb 10,7 plt 219   Ct chest with no signs of pulmonary embolism.  3 mm left solid pulmonary nodule.  Ct abdomen and pelvis with no acute changes.  Stable solid mass in the medial aspect of the left kidney.   EKG 182 bpm, normal axis, qtc 556, atrial fibrillation rhythm with poor R wave progression, no significant ST segment or T wave changes.    Assessment and Plan: * Atrial fibrillation with RVR ([place]) Patient has converted to sinus rhythm. Continue to have chest pain, mainly pleuritic.  Plan to continue with diltiazem for rate control and anticoagulation with heparin. Possible transition to oral diltiazem in the next 24 hrs Continue telemetry monitoring.   NSTEMI (non-ST elevated myocardial infarction) ([place]) Significant elevation in high sensitive troponin.  Follow up EKG with anterior septal T wave inversions (new changes).  Plan to continue pain control with analgesics Continue anticoagulation with heparin  and plan for further work up cardiac catheterization and coronary angiography.   ESRD (end stage renal disease) ([place]) Continue renal replacement therapy per nephrology recommendations.  Resume midodrine on HD days.   Renal cell carcinoma of left kidney ([place]) S/p ablation on 7/5.  Also had coil embolization. Okay per Dr. Pascal Lux to do anticoagulation (for a.fib).  Diabetes mellitus type 2, controlled ([place]) Looks like she takes insulin with mealtimes only. Med-rec is pending Sensitive SSI AC for the moment  IPMN (intraductal papillary mucinous neoplasm) S/p Whipple procedure 2018, non malignant and no recurrence.  Pulmonary nodule 21m pulm nodule. Normally wouldn't need follow up per recs; however, Pt does have renal cell carcinoma. So follow up CT may be indicated in future, not clear on timing of that though (how many months) May wish to curbside onc to answer that question prior to discharge.        Subjective: Patient with chest pain when deep inspiration, no dyspnea, no nausea or vomiting,   Physical Exam: Vitals:   08/26/21 1200 08/26/21 1230 08/26/21 1300 08/26/21 1315  BP: 113/89 130/77 120/67 (!) 95/55  Pulse: 86 78 78 80  Resp: (!) 25 (!) 25 (!) 21 20  Temp:      TempSrc:      SpO2: 91% 96% 90% 91%  Weight:      Height:       Neurology awake and alert ENT with no pallor Cardiovascular with S1 and S2 present and rhythmic, no gallops or murmurs Respiratory with no rales or wheezing Abdomen not  distended no lower extremity edema  Data Reviewed:    Family Communication: no family at the bedside   Disposition: Status is: Observation The patient remains OBS appropriate and will d/c before 2 midnights.  Planned Discharge Destination: Home      Author: Tawni Millers, MD 08/26/2021 1:40 PM  For on call review [email or web address].

## 2021-08-26 NOTE — Progress Notes (Signed)
ANTICOAGULATION CONSULT NOTE   Pharmacy Consult for heparin Indication: atrial fibrillation  Allergies  Allergen Reactions   Gemfibrozil Other (See Comments)    Myalgia    Lotemax [Loteprednol Etabonate] Other (See Comments)    burning   Statins Other (See Comments)    Muscle weakness   Doxycycline Other (See Comments)    UNSPECIFIED REACTION    Codeine Other (See Comments)    headache   Hydrocodone-Acetaminophen Other (See Comments)    Headache. Tolerates acetaminophen.    Patient Measurements: Height: '5\' 2"'$  (157.5 cm) Weight: 59.6 kg (131 lb 6.3 oz) IBW/kg (Calculated) : 50.1  Vital Signs: Temp: 98.1 F (36.7 C) (07/09 1611) Temp Source: Oral (07/09 1611) BP: 128/72 (07/09 1611) Pulse Rate: 84 (07/09 1611)  Labs: Recent Labs    08/25/21 1917 08/25/21 2118 08/26/21 0447 08/26/21 0804 08/26/21 1754  HGB 10.7*  --  8.9*  --   --   HCT 33.2*  --  28.4*  --   --   PLT 219  --  207  --   --   HEPARINUNFRC  --   --   --  <0.10* <0.10*  CREATININE 3.63*  --  3.87*  --   --   TROPONINIHS 1,098* 925*  --   --   --      Estimated Creatinine Clearance: 8.6 mL/min (A) (by C-G formula based on SCr of 3.87 mg/dL (H)).   Assessment: 86yo female c/o rapid HR and chest tightness >> found to be in Afib with RVR, no known h/o Afib >> to begin heparin.  2nd heparin level undetectable, no infusion issues   Goal of Therapy:  Heparin level 0.3-0.7 units/ml Monitor platelets by anticoagulation protocol: Yes   Plan:  Heparin to 1200 units / hr Next heparin level in 8 hours  Thank you Anette Guarneri, PharmD  08/26/2021 8:03 PM

## 2021-08-26 NOTE — Assessment & Plan Note (Signed)
70m pulm nodule. Normally wouldn't need follow up per recs; however, Pt does have renal cell carcinoma. 1. So follow up CT may be indicated in future, not clear on timing of that though (how many months) 2. May wish to curbside onc to answer that question prior to discharge.

## 2021-08-26 NOTE — Assessment & Plan Note (Addendum)
S/p Whipple procedure 2018, non malignant and no recurrence.

## 2021-08-26 NOTE — Assessment & Plan Note (Deleted)
Unclear if plaque wall rupture or just demand ischemia. Concern for MI prior to todays A.Fib RVR as she was having severe CP last evening with HR of 100-110 only per Son at that time when he took her pulse, Son is paramedic. Trop down trending in ED. CP free as long as she doesn't move around currently. 1. ACS pathway 2. Heparin gtt for a.fib and NSTEMI 3. ASA 325 now and 81 daily 4. H/o intolerance to statin: 1. Check lipid pnl in AM 2. Consider PCSK9 5. Plan per cards: 1. 2d echo later today (Sunday) 2. LHC on Monday 6. Will let pt eat and make NPO after MN Monday.

## 2021-08-26 NOTE — Consult Note (Incomplete)
Cardiology Consultation:   Patient ID: Kim Burns MRN: [id number]; DOB: [birth year]  Admit date: 08/25/2021 Date of Consult: 08/25/2021  Primary Care Provider: [name], MD [facility] Cardiologist: Peter Martinique, MD  [facility] Electrophysiologist:  None    Patient Profile:   Kim Burns is a 86 y.o. female with HTN, CKD, LVH and prior evaluation of bradycardia,  who is being seen today for the evaluation of chest pain and new onset AF/RVR at the request of Dr. Alvino Chapel.   History of Present Illness:   Kim Burns   Around 2300 07/06 had acute onset central non radiation CP, 20/10 severity, no rhythm awarenss. Son works for [employer], 100-110 and regular   HR 126, 90%/O2, RR 19, 101/69 (78)  Current 5/10 severity   Right now 5/10 severity but sometimes 10/10 severity     Past Medical History:  Diagnosis Date  . Anemia of chronic disease    takes iron  . Anxiety   . Blood transfusion without reported diagnosis   . Bradycardia   . CKD (chronic kidney disease) stage 3, GFR 30-59 ml/min [facility])    sees dr Arty Baumgartner sees next in jan 2018  . Diabetes mellitus without complication ([place])    became diabetic after Whipple procedure  . Dialysis patient [facility])    M-W-F AV graft left arm  . Diarrhea   . Dysrhythmia 04/16/2016   bradycardia due to medication   . GERD (gastroesophageal reflux disease)   . Gout   . Headache   . History of kidney stones 06/2013  . Hyperparathyroidism ([place])   . Hypertension   . PONV (postoperative nausea and vomiting)   . Sleep apnea    no cpap machine. could not tolerate  . Stroke ([place]) 04/16/2016   TIA 1995  . Vitamin D deficiency     Past Surgical History:  Procedure Laterality Date  . ABDOMINAL HYSTERECTOMY  1985   complete  . [place]   lower  . BASCILIC VEIN TRANSPOSITION Left 04/24/2017   Procedure: LEFT ARM FIRST STAGE BASILIC VEIN TRANSPOSITION;  Surgeon: Serafina Mitchell, MD;  Location:  [place];  Service: Vascular;  Laterality: Left;  . BASCILIC VEIN TRANSPOSITION Left 07/10/2017   Procedure: SECOND STAGE BASILIC VEIN TRANSPOSITION LEFT ARM;  Surgeon: Serafina Mitchell, MD;  Location: [place];  Service: Vascular;  Laterality: Left;  . BREAST LUMPECTOMY Left x 2   many years apart, benign  . CHOLECYSTECTOMY  1985  . CYSTOSCOPY WITH URETEROSCOPY AND STENT PLACEMENT Left 06/18/2013   Procedure: CYSTOSCOPY WITH Lef URETEROSCOPY AND Left STENT PLACEMENT;  Surgeon: Dutch Gray, MD;  Location: [facility];  Service: Urology;  Laterality: Left;  . ESOPHAGOGASTRODUODENOSCOPY (EGD) WITH PROPOFOL N/A 11/13/2016   Procedure: ESOPHAGOGASTRODUODENOSCOPY (EGD) WITH PROPOFOL;  Surgeon: Otis Brace, MD;  Location: [place];  Service: Gastroenterology;  Laterality: N/A;  . EUS N/A 02/07/2016   Procedure: ESOPHAGEAL ENDOSCOPIC ULTRASOUND (EUS) RADIAL;  Surgeon: Arta Silence, MD;  [facility];  Service: Endoscopy;  Laterality: N/A;  . EYE SURGERY Bilateral 2014   ioc for catracts   . FOOT SURGERY Left 1990   something with toes unsure what   . HERNIA REPAIR  2018  . IR EMBO TUMOR ORGAN ISCHEMIA INFARCT INC GUIDE ROADMAPPING  08/22/2021  . IR RADIOLOGIST EVAL & MGMT  05/18/2021  . IR RENAL SUPRASEL UNI S&I MOD SED  08/22/2021  . IR US GUIDE VASC ACCESS RIGHT  08/22/2021  . PARATHYROID  EXPLORATION    . RADIOLOGY WITH ANESTHESIA Left 08/22/2021   Procedure: MICROWAVE ABLATION;  Surgeon: Sandi Mariscal, MD;  Location: [facility];  Service: Anesthesiology;  Laterality: Left;  . WHIPPLE PROCEDURE N/A 04/23/2016   Procedure: WHIPPLE PROCEDURE;  Surgeon: Stark Klein, MD;  Location: [place];  Service: General;  Laterality: N/A;     {Home Medications (Optional):21181}  Inpatient Medications: Scheduled Meds:  Continuous Infusions: . diltiazem (CARDIZEM) infusion 12.5 mg/hr (08/25/21 2308)  . heparin 800 Units/hr (08/25/21 2324)   PRN Meds:   Allergies:    Allergies  Allergen Reactions  .  Gemfibrozil Other (See Comments)    Myalgia   . Lotemax [Loteprednol Etabonate] Other (See Comments)    burning  . Statins Other (See Comments)    Muscle weakness  . Doxycycline Other (See Comments)    UNSPECIFIED REACTION   . Codeine Other (See Comments)    headache  . Hydrocodone-Acetaminophen Other (See Comments)    Headache. Tolerates acetaminophen.    Social History:   Social History   Socioeconomic History  . Marital status: Widowed    Spouse name: Not on file  . Number of children: Not on file  . Years of education: Not on file  . Highest education level: Not on file  Occupational History  . Not on file  Tobacco Use  . Smoking status: Never  . Smokeless tobacco: Never  Vaping Use  . Vaping Use: Never used  Substance and Sexual Activity  . Alcohol use: No  . Drug use: No  . Sexual activity: Not on file  Other Topics Concern  . Not on file  Social History Narrative  . Not on file   Social Determinants of Health   Financial Resource Strain: Not on file  Food Insecurity: Not on file  Transportation Needs: Not on file  Physical Activity: Not on file  Stress: Not on file  Social Connections: Not on file  Intimate Partner Violence: Not on file    Family History:   *** Family History  Problem Relation Age of Onset  . Heart disease Mother   . Stroke Mother   . Cancer Father        colon  . Alzheimer's disease Sister   . Alzheimer's disease Sister   . Cancer Brother        brain  . Breast cancer Neg Hx      ROS:  Review of Systems: [y] = yes, [ ] = no      General: Weight gain [ ]; Weight loss [ ]; Anorexia [ ]; Fatigue [ ]; Fever [ ]; Chills [ ]; Weakness [ ]   Cardiac: Chest pain/pressure [ ]; Resting SOB [ ]; Exertional SOB [ ]; Orthopnea [ ]; Pedal Edema [ ]; Palpitations [ ]; Syncope [ ]; Presyncope [ ]; Paroxysmal nocturnal dyspnea [ ]   Pulmonary: Cough [ ]; Wheezing [ ]; Hemoptysis [ ]; Sputum [ ]; Snoring [ ]   GI: Vomiting [ ]; Dysphagia [  ]; Melena [ ]; Hematochezia [ ]; Heartburn [ ]; Abdominal pain [ ]; Constipation [ ]; Diarrhea [ ]; BRBPR [ ]   GU: Hematuria [ ]; Dysuria [ ]; Nocturia [ ] Vascular: Pain in legs with walking [ ]; Pain in feet with lying flat [ ]; Non-healing sores [ ]; Stroke [ ]; TIA [ ]; Slurred speech [ ];   Neuro: Headaches [ ]; Vertigo [ ]; Seizures [ ]; Paresthesias [ ];Blurred vision [ ];  Diplopia [ ]; Vision changes [ ]   Ortho/Skin: Arthritis [ ]; Joint pain [ ]; Muscle pain [ ]; Joint swelling [ ]; Back Pain [ ]; Rash [ ]   Psych: Depression [ ]; Anxiety [ ]   Heme: Bleeding problems [ ]; Clotting disorders [ ]; Anemia [ ]   Endocrine: Diabetes [ ]; Thyroid dysfunction [ ]   Physical Exam/Data:   Vitals:   08/25/21 2100 08/25/21 2117 08/25/21 2145 08/25/21 2200  BP: 105/64  112/66 111/77  Pulse: (!) 136 (!) 139 (!) 132 (!) 130  Resp: 19 (!) _0 Temp:      TempSrc:      SpO2: 93% 92% 93% 95%  Weight:      Height:        Intake/Output Summary (Last 24 hours) at 08/25/2021 2327 Last data filed at 08/25/2021 2216 Gross per 24 hour  Intake 500 ml  Output --  Net 500 ml      08/25/2021    7:25 PM 08/22/2021    6:58 PM 08/22/2021    7:57 AM  Last 3 Weights  Weight (lbs) 124 lb 124 lb 124 lb  Weight (kg) 56.246 kg 56.246 kg 56.246 kg     Body mass index is 22.68 kg/m.  General:  Well nourished, well developed, in no acute distress*** HEENT: normal Lymph: no adenopathy Neck: no JVD Endocrine:  No thryomegaly Vascular: No carotid bruits; FA pulses 2+ bilaterally without bruits  Cardiac:  normal S1, S2; RRR; no murmur *** Lungs:  clear to auscultation bilaterally, no wheezing, rhonchi or rales  Abd: soft, nontender, no hepatomegaly  Ext: no edema Musculoskeletal:  No deformities, BUE and BLE strength normal and equal Skin: warm and dry  Neuro:  CNs 2-12 intact, no focal abnormalities noted Psych:  Normal affect   EKG:  The EKG was personally reviewed and demonstrates:   *** Telemetry:  Telemetry was personally reviewed and demonstrates:  ***  Relevant CV Studies: ***  Laboratory Data:  High Sensitivity Troponin:   Recent Labs  Lab 08/25/21 1917 08/25/21 2118  TROPONINIHS 1,098* 925*     Chemistry Recent Labs  Lab 08/22/21 0740 08/25/21 1917  NA 138 134*  K 3.8 3.9  CL 98 89*  CO2 29 27  GLUCOSE 148* 180*  BUN 31* 27*  CREATININE 3.03* 3.63*  CALCIUM 9.1 8.7*  GFRNONAA 15* 12*  ANIONGAP 11 18*    Recent Labs  Lab 08/25/21 1917  PROT 6.6  ALBUMIN 2.9*  AST 21  ALT 23  ALKPHOS 87  BILITOT 0.8   Hematology Recent Labs  Lab 08/22/21 0740 08/25/21 1917  WBC 7.1 13.1*  RBC 4.15 3.66*  HGB 11.9* 10.7*  HCT 38.5 33.2*  MCV 92.8 90.7  MCH 28.7 29.2  MCHC 30.9 32.2  RDW 15.4 15.8*  PLT 227 219   BNPNo results for input(s): "BNP", "PROBNP" in the last 168 hours.  DDimer No results for input(s): "DDIMER" in the last 168 hours.  Radiology/Studies:  CT CHEST ABDOMEN PELVIS W CONTRAST  Result Date: 08/25/2021 CLINICAL DATA:  History of recent renal ablation with tachycardia, initial encounter EXAM: CT CHEST, ABDOMEN, AND PELVIS WITH CONTRAST TECHNIQUE: Multidetector CT imaging of the chest, abdomen and pelvis was performed following the standard protocol during bolus administration of intravenous contrast. RADIATION DOSE REDUCTION: This exam was performed according to the departmental dose-optimization program which includes automated exposure control, adjustment of the mA and/or kV according to patient size  and/or use of iterative reconstruction technique. CONTRAST:  79m OMNIPAQUE IOHEXOL 300 MG/ML  SOLN COMPARISON:  None Available. FINDINGS: CT CHEST FINDINGS Cardiovascular: Scattered atherosclerotic calcifications are noted. No aneurysmal dilatation or dissection is noted. No cardiac enlargement is seen. The pulmonary artery shows a normal branching pattern bilaterally. No filling defect to suggest pulmonary embolism is noted.  Scattered coronary calcifications are noted. Mediastinum/Nodes: Thoracic inlet is within normal limits. No hilar or mediastinal adenopathy is noted. The esophagus is within normal limits. Lungs/Pleura: Lungs are well aerated bilaterally. No focal infiltrate or sizable effusion is noted. Minimal scarring is noted in the right base. Tiny less than 4 mm nodule is noted in the superior segment of the left lower lobe. No other nodules are noted. Musculoskeletal: Degenerative changes of the thoracic spine are noted. No rib abnormality is noted. CT ABDOMEN PELVIS FINDINGS Hepatobiliary: No focal liver abnormality is seen. Status post cholecystectomy. No biliary dilatation. Pancreas: Postsurgical changes are noted consistent with prior Whipple. The residual pancreas is atrophic. Spleen: Scattered cysts are noted within the spleen. Adrenals/Urinary Tract: Adrenal glands are within normal limits. Kidneys demonstrate multiple hypodense lesions throughout both kidneys. A mildly enhancing solid lesion is noted in the medial aspect of the left lower pole. This has undergone recent ablation and coil embolization. No new focal abnormality is noted. No obstructive changes are seen. The bladder is decompressed. Stomach/Bowel: No obstructive or inflammatory changes of the colon are seen. Postsurgical changes are noted consistent with the prior Whipple procedure. The appendix is not well visualized and may have been surgically removed. No inflammatory changes are seen. Visualized small bowel is within normal limits aside from the previously described surgical change. Vascular/Lymphatic: Aortic atherosclerosis. No enlarged abdominal or pelvic lymph nodes. Reproductive: Status post hysterectomy. No adnexal masses. Other: No abdominal wall hernia or abnormality. No abdominopelvic ascites. Musculoskeletal: Degenerative changes of lumbar spine are noted. No acute bony abnormality is noted. IMPRESSION: CT of the chest: No evidence of  pulmonary emboli. 3 mm left solid pulmonary nodule. No routine follow-up imaging is recommended per Fleischner Society Guidelines. These guidelines do not apply to immunocompromised patients and patients with cancer. Follow up in patients with significant comorbidities as clinically warranted. For lung cancer screening, adhere to Lung-RADS guidelines. Reference: Radiology. 2017; 284(1):228-43. CT of the abdomen and pelvis: Changes consistent with prior Whipple procedure. Stable solid mass in the medial aspect of the left kidney. This has recently undergone ablation as well as coil embolization. No acute abnormality noted. Electronically Signed   By: MInez CatalinaM.D.   On: 08/25/2021 22:06   CT GUIDE TISSUE ABLATION  Result Date: 08/22/2021 INDICATION: History of renal cell carcinoma. Poor operative candidate. Patient presents today for image guided left-sided renal microwave ablation following percutaneous particle and coil embolization performed this morning. Please refer to formal consultation in epic EMR dated 06/04/2021 additional details. EXAM: CT-GUIDED PERCUTANEOUS THERMAL ABLATION OF RENAL LESION ANESTHESIA/SEDATION: General MEDICATIONS: None, antibiotics were administered during the preceding renal arteriogram and percutaneous embolization. CONTRAST:  None COMPARISONS: COMPARISONS CT abdomen and pelvis-05/03/2021 PROCEDURE: The procedure, risks, benefits, and alternatives were explained to the patient. Questions regarding the procedure were encouraged and answered. The patient understands and consents to the procedure. The patient was placed under general anesthesia. Initial unenhanced CT was performed in a prone position to localize the at least 3.5 x 3.1 cm partially exophytic lesion involving the inferior medial aspect of the left kidney (image 67, series 2). The CT gantry table position was marked  and lesion was identified sonographically. The procedure was planned. The patient was prepped with  chlorhexidine in usual sterile fashion. Two 22 gauge spinal needles were utilized for procedural purposes. Next, under a combination ultrasound and CT guidance, 2 15 gauge microwave ablation probes were positioned bracketing the mid aspect the central left-sided renal lesion. Multiple ultrasound images were saved procedural documentation purposes. Appropriate positioning was confirmed and a 1 minute burn was performed at 90 watts followed by a 4 minute burn at 65 watts with intra procedural imaging obtained at the 3 minute mark of the ablation. Both probes were removed and postprocedural imaging was obtained. Access sites were anesthetized with 1% lidocaine with epinephrine and dressings were applied. The patient tolerated the procedure well without immediate postprocedural complication. COMPLICATIONS: None immediate. FINDINGS: Noncontrast preprocedural imaging demonstrates an approximally 3.5 x 3.1 cm partially exophytic lesion arising from the inferior medial aspect of the left kidney (image 67, series 2). Contrast is noted within the hypervascular lesion as well as within the adjacent inferior pole of the left kidney following preceding particle and coil embolization. The lesion was identified sonographically and utilizing a combination of CT and ultrasound guidance, 2 probes were placed bracketing the central aspect of the lesion (representative image 17, series 12). The microwave ablation site appears to encompass the entirety of the partially exophytic left-sided renal lesion (series 13). Postprocedural imaging demonstrates expected air about the ablation site without postprocedural complication. Specifically, no pneumothorax or significant hemorrhage about the ablation site. IMPRESSION: Technically successful ultrasound and CT-guided thermal ablation of partially exophytic left-sided renal lesion. PLAN: - The patient will be admitted overnight for continued observation and PCA usage. - The patient will have a  telemedicine consultation approximally 4 weeks following today's procedure with initial postprocedural imaging obtained in 3 months (early October 2023). Electronically Signed   By: Sandi Mariscal M.D.   On: 08/22/2021 14:29   IR EMBO TUMOR ORGAN ISCHEMIA INFARCT INC GUIDE ROADMAPPING  Result Date: 08/22/2021 INDICATION: Left-sided renal cell carcinoma. Poor operative candidate. Patient presents today for left-sided renal arteriogram and percutaneous embolization prior to image guided ablation. Please refer to consultation epic EMR dated 06/04/2021 for additional details. EXAM: 1. ULTRASOUND GUIDANCE FOR ARTERIAL ACCESS 2. LEFT RENAL ARTERIOGRAM 3. SUB SELECTIVE ARTERIOGRAM OF THE INFERIOR MEDIAL DIVISION OF THE LEFT RENAL ARTERY AND PERCUTANEOUS PARTICLE AND COIL EMBOLIZATION. 4. SUB SELECTIVE ARTERIOGRAM OF THE INFEROLATERAL DIVISION OF THE LEFT RENAL ARTERY AND PERCUTANEOUS PARTICLE AND COIL EMBOLIZATION MEDICATIONS: Cefoxitin 2 gm IV. The antibiotic was administered within one hour of the procedure ANESTHESIA/SEDATION: Moderate (conscious) sedation was employed during this procedure as administered by the Interventional Radiology RN. A total of Versed 0.5 mg and Fentanyl 50 mcg was administered intravenously. Moderate Sedation Time: 56 minutes. The patient's level of consciousness and vital signs were monitored continuously by radiology nursing throughout the procedure under my direct supervision. CONTRAST:  120 cc Omnipaque 300 FLUOROSCOPY TIME:  803 mGy COMPLICATIONS: None immediate. PROCEDURE: Informed consent was obtained from the patient following explanation of the procedure, risks, benefits and alternatives. The patient understands, agrees and consents for the procedure. All questions were addressed. A time out was performed prior to the initiation of the procedure. Maximal barrier sterile technique utilized including caps, mask, sterile gowns, sterile gloves, large sterile drape, hand hygiene, and  Betadine prep. The right femoral head was marked fluoroscopically. Under sterile conditions and local anesthesia, the right common femoral artery access was performed with a micropuncture needle. Under direct ultrasound guidance,  the right common femoral was accessed with a micropuncture kit. An ultrasound image was saved for documentation purposes. This allowed for placement of a 5-French vascular sheath. A limited arteriogram was performed through the side arm of the sheath confirming appropriate access within the right common femoral artery. The 5-French Mickelson catheter was advanced to the level of the thoracic aorta where it was performed, back bled and flushed. The Mickelson catheter was then utilized to select the left renal artery and a left renal arteriogram was performed. Next, with the use of a fathom 14 microwire, a cantata microcatheter was utilized to select the inferior division the left renal artery and a sub selective arteriogram was performed The microcatheter was then utilized to select the inferolateral division of the left renal artery and a sub selective arteriogram was performed. Next, the vessel was embolized with approximally 1/5 vial of 100-300 micron Embospheres. Once vessel stasis was achieved, the vessel was embolized with overlapping 4 mm and 2 mm interlock coils. The microcatheter was partially retracted and a post embolization arteriogram was performed. The microcatheter was then utilized to select the inferior medial division of the left renal artery and a sub selective arteriogram was performed. Next, the vessel was embolized with approximally 1/5 vial of 103 100 micron Embospheres. Again, once vessel stasis was achieved, the vessel was embolized with overlapping 4 mm and 2 mm interlock coils. The microcatheter was partially retracted and a post embolization arteriogram was performed. The microcatheter was then removed and a completion arteriogram was performed via the [facility]  catheter. Images reviewed and the procedure was terminated. All wires, catheters and sheaths were removed the patient. Hemostasis was achieved at the right groin access site with deployment of a Celt closure device. Postprocedural spot fluoroscopic image demonstrates appropriate positioning of the puncture location at the level of the humeral head. A dressing was applied. The patient tolerated the procedure well without immediate post procedural complication. FINDINGS: Selective right renal arteriogram demonstrates a fairly well-defined partially exophytic hypervascular lesion arising from the inferior pole of the left kidney supplied by both the inferolateral and inferior medial divisions of the left renal artery, as was suggested on preceding CTA. Following percutaneous particle and coil embolization of both the inferolateral and inferior medial divisions of the left renal artery, completion arteriogram demonstrates no residual flow to the hypervascular renal mass with preserved arterial supply to the remainder of the renal parenchyma. IMPRESSION: Technically successful percutaneous particle and coil embolization of both the inferomedial and inferolateral divisions of the left renal artery supplying the known partially exophytic hypervascular left-sided RCC. PLAN: Patient was then escorted to CT for image guided microwave ablation of the left-sided renal mass. Electronically Signed   By: Sandi Mariscal M.D.   On: 08/22/2021 11:22   IR US Guide Vasc Access Right  Result Date: 08/22/2021 INDICATION: Left-sided renal cell carcinoma. Poor operative candidate. Patient presents today for left-sided renal arteriogram and percutaneous embolization prior to image guided ablation. Please refer to consultation epic EMR dated 06/04/2021 for additional details. EXAM: 1. ULTRASOUND GUIDANCE FOR ARTERIAL ACCESS 2. LEFT RENAL ARTERIOGRAM 3. SUB SELECTIVE ARTERIOGRAM OF THE INFERIOR MEDIAL DIVISION OF THE LEFT RENAL ARTERY AND  PERCUTANEOUS PARTICLE AND COIL EMBOLIZATION. 4. SUB SELECTIVE ARTERIOGRAM OF THE INFEROLATERAL DIVISION OF THE LEFT RENAL ARTERY AND PERCUTANEOUS PARTICLE AND COIL EMBOLIZATION MEDICATIONS: Cefoxitin 2 gm IV. The antibiotic was administered within one hour of the procedure ANESTHESIA/SEDATION: Moderate (conscious) sedation was employed during this procedure as administered by the Interventional  Radiology RN. A total of Versed 0.5 mg and Fentanyl 50 mcg was administered intravenously. Moderate Sedation Time: 56 minutes. The patient's level of consciousness and vital signs were monitored continuously by radiology nursing throughout the procedure under my direct supervision. CONTRAST:  120 cc Omnipaque 300 FLUOROSCOPY TIME:  546 mGy COMPLICATIONS: None immediate. PROCEDURE: Informed consent was obtained from the patient following explanation of the procedure, risks, benefits and alternatives. The patient understands, agrees and consents for the procedure. All questions were addressed. A time out was performed prior to the initiation of the procedure. Maximal barrier sterile technique utilized including caps, mask, sterile gowns, sterile gloves, large sterile drape, hand hygiene, and Betadine prep. The right femoral head was marked fluoroscopically. Under sterile conditions and local anesthesia, the right common femoral artery access was performed with a micropuncture needle. Under direct ultrasound guidance, the right common femoral was accessed with a micropuncture kit. An ultrasound image was saved for documentation purposes. This allowed for placement of a 5-French vascular sheath. A limited arteriogram was performed through the side arm of the sheath confirming appropriate access within the right common femoral artery. The 5-French Mickelson catheter was advanced to the level of the thoracic aorta where it was performed, back bled and flushed. The Mickelson catheter was then utilized to select the left renal artery  and a left renal arteriogram was performed. Next, with the use of a fathom 14 microwire, a cantata microcatheter was utilized to select the inferior division the left renal artery and a sub selective arteriogram was performed The microcatheter was then utilized to select the inferolateral division of the left renal artery and a sub selective arteriogram was performed. Next, the vessel was embolized with approximally 1/5 vial of 100-300 micron Embospheres. Once vessel stasis was achieved, the vessel was embolized with overlapping 4 mm and 2 mm interlock coils. The microcatheter was partially retracted and a post embolization arteriogram was performed. The microcatheter was then utilized to select the inferior medial division of the left renal artery and a sub selective arteriogram was performed. Next, the vessel was embolized with approximally 1/5 vial of 103 100 micron Embospheres. Again, once vessel stasis was achieved, the vessel was embolized with overlapping 4 mm and 2 mm interlock coils. The microcatheter was partially retracted and a post embolization arteriogram was performed. The microcatheter was then removed and a completion arteriogram was performed via the [facility] catheter. Images reviewed and the procedure was terminated. All wires, catheters and sheaths were removed the patient. Hemostasis was achieved at the right groin access site with deployment of a Celt closure device. Postprocedural spot fluoroscopic image demonstrates appropriate positioning of the puncture location at the level of the humeral head. A dressing was applied. The patient tolerated the procedure well without immediate post procedural complication. FINDINGS: Selective right renal arteriogram demonstrates a fairly well-defined partially exophytic hypervascular lesion arising from the inferior pole of the left kidney supplied by both the inferolateral and inferior medial divisions of the left renal artery, as was suggested on  preceding CTA. Following percutaneous particle and coil embolization of both the inferolateral and inferior medial divisions of the left renal artery, completion arteriogram demonstrates no residual flow to the hypervascular renal mass with preserved arterial supply to the remainder of the renal parenchyma. IMPRESSION: Technically successful percutaneous particle and coil embolization of both the inferomedial and inferolateral divisions of the left renal artery supplying the known partially exophytic hypervascular left-sided RCC. PLAN: Patient was then escorted to CT for image guided  microwave ablation of the left-sided renal mass. Electronically Signed   By: Sandi Mariscal M.D.   On: 08/22/2021 11:22   IR Angiogram Renal Uni Selective  Result Date: 08/22/2021 INDICATION: Left-sided renal cell carcinoma. Poor operative candidate. Patient presents today for left-sided renal arteriogram and percutaneous embolization prior to image guided ablation. Please refer to consultation epic EMR dated 06/04/2021 for additional details. EXAM: 1. ULTRASOUND GUIDANCE FOR ARTERIAL ACCESS 2. LEFT RENAL ARTERIOGRAM 3. SUB SELECTIVE ARTERIOGRAM OF THE INFERIOR MEDIAL DIVISION OF THE LEFT RENAL ARTERY AND PERCUTANEOUS PARTICLE AND COIL EMBOLIZATION. 4. SUB SELECTIVE ARTERIOGRAM OF THE INFEROLATERAL DIVISION OF THE LEFT RENAL ARTERY AND PERCUTANEOUS PARTICLE AND COIL EMBOLIZATION MEDICATIONS: Cefoxitin 2 gm IV. The antibiotic was administered within one hour of the procedure ANESTHESIA/SEDATION: Moderate (conscious) sedation was employed during this procedure as administered by the Interventional Radiology RN. A total of Versed 0.5 mg and Fentanyl 50 mcg was administered intravenously. Moderate Sedation Time: 56 minutes. The patient's level of consciousness and vital signs were monitored continuously by radiology nursing throughout the procedure under my direct supervision. CONTRAST:  120 cc Omnipaque 300 FLUOROSCOPY TIME:  371 mGy  COMPLICATIONS: None immediate. PROCEDURE: Informed consent was obtained from the patient following explanation of the procedure, risks, benefits and alternatives. The patient understands, agrees and consents for the procedure. All questions were addressed. A time out was performed prior to the initiation of the procedure. Maximal barrier sterile technique utilized including caps, mask, sterile gowns, sterile gloves, large sterile drape, hand hygiene, and Betadine prep. The right femoral head was marked fluoroscopically. Under sterile conditions and local anesthesia, the right common femoral artery access was performed with a micropuncture needle. Under direct ultrasound guidance, the right common femoral was accessed with a micropuncture kit. An ultrasound image was saved for documentation purposes. This allowed for placement of a 5-French vascular sheath. A limited arteriogram was performed through the side arm of the sheath confirming appropriate access within the right common femoral artery. The 5-French Mickelson catheter was advanced to the level of the thoracic aorta where it was performed, back bled and flushed. The Mickelson catheter was then utilized to select the left renal artery and a left renal arteriogram was performed. Next, with the use of a fathom 14 microwire, a cantata microcatheter was utilized to select the inferior division the left renal artery and a sub selective arteriogram was performed The microcatheter was then utilized to select the inferolateral division of the left renal artery and a sub selective arteriogram was performed. Next, the vessel was embolized with approximally 1/5 vial of 100-300 micron Embospheres. Once vessel stasis was achieved, the vessel was embolized with overlapping 4 mm and 2 mm interlock coils. The microcatheter was partially retracted and a post embolization arteriogram was performed. The microcatheter was then utilized to select the inferior medial division of  the left renal artery and a sub selective arteriogram was performed. Next, the vessel was embolized with approximally 1/5 vial of 103 100 micron Embospheres. Again, once vessel stasis was achieved, the vessel was embolized with overlapping 4 mm and 2 mm interlock coils. The microcatheter was partially retracted and a post embolization arteriogram was performed. The microcatheter was then removed and a completion arteriogram was performed via the [facility] catheter. Images reviewed and the procedure was terminated. All wires, catheters and sheaths were removed the patient. Hemostasis was achieved at the right groin access site with deployment of a Celt closure device. Postprocedural spot fluoroscopic image demonstrates appropriate positioning of the  puncture location at the level of the humeral head. A dressing was applied. The patient tolerated the procedure well without immediate post procedural complication. FINDINGS: Selective right renal arteriogram demonstrates a fairly well-defined partially exophytic hypervascular lesion arising from the inferior pole of the left kidney supplied by both the inferolateral and inferior medial divisions of the left renal artery, as was suggested on preceding CTA. Following percutaneous particle and coil embolization of both the inferolateral and inferior medial divisions of the left renal artery, completion arteriogram demonstrates no residual flow to the hypervascular renal mass with preserved arterial supply to the remainder of the renal parenchyma. IMPRESSION: Technically successful percutaneous particle and coil embolization of both the inferomedial and inferolateral divisions of the left renal artery supplying the known partially exophytic hypervascular left-sided RCC. PLAN: Patient was then escorted to CT for image guided microwave ablation of the left-sided renal mass. Electronically Signed   By: Sandi Mariscal M.D.   On: 08/22/2021 11:22   { If the patient is being seen  for chest pain, [place], NSTEMI or STEMI Press F2 to calculate a risk score         :[id number]}   {Chest Pain/ACS Risk Score        :[id number]}   Assessment and Plan:   ***  {Are we signing off today?:[id number]}  For questions or updates, please contact [place] Please consult [email or web address] for contact info under    Signed, Dion Body, MD  08/25/2021 11:27 PM

## 2021-08-26 NOTE — ED Notes (Signed)
Patient denies pain and is resting comfortably.  

## 2021-08-26 NOTE — H&P (Signed)
History and Physical    Patient: Kim Burns EAV:[id number] DOB: 1935/03/23 DOA: 08/25/2021 DOS: the patient was seen and examined on 08/26/2021 PCP: [name], MD  Patient coming from: Home  Chief Complaint:  Chief Complaint  Patient presents with   Tachycardia   HPI: Kim Burns is a 86 y.o. female with medical history significant of ESRD on HD MWF, IPMN s/p Whipple procedure in 2018, renal cell carcinoma s/p ablation and embolization procedure done by IR just 4 days ago (7/5).  Pt presents to ED with c/o CP and palpitations.  Last evening (7/7) she had severe CP (10/10 intensity), but HR 100-110 per son who took it at the time.  Didn't seek medical care at that time.  Today (7/8) she had chest discomfort, worsened with activity, palpitations, and in to ED with rapid HR.  In ED: Initially a.fib RVR with rate of 180.  Now improved to 125 with improvement in CP after she has been started on cardizem gtt.    Review of Systems: As mentioned in the history of present illness. All other systems reviewed and are negative. Past Medical History:  Diagnosis Date   Anemia of chronic disease    takes iron   Anxiety    Blood transfusion without reported diagnosis    Bradycardia    CKD (chronic kidney disease) stage 3, GFR 30-59 ml/min [facility])    sees dr Arty Baumgartner sees next in jan 2018   Diabetes mellitus without complication ([place])    became diabetic after Whipple procedure   Dialysis patient [facility])    M-W-F AV graft left arm   Diarrhea    Dysrhythmia 04/16/2016   bradycardia due to medication    GERD (gastroesophageal reflux disease)    Gout    Headache    History of kidney stones 06/2013   Hyperparathyroidism ([place])    Hypertension    PONV (postoperative nausea and vomiting)    Sleep apnea    no cpap machine. could not tolerate   Stroke ([place]) 04/16/2016   TIA 1995   Vitamin D deficiency    Past Surgical History:  Procedure Laterality Date   ABDOMINAL  HYSTERECTOMY  1985   complete   BACK SURGERY  7829   lower   BASCILIC VEIN TRANSPOSITION Left 04/24/2017   Procedure: LEFT ARM FIRST STAGE BASILIC VEIN TRANSPOSITION;  Surgeon: Serafina Mitchell, MD;  Location: [place];  Service: Vascular;  Laterality: Left;   [place] Left 07/10/2017   Procedure: SECOND STAGE BASILIC VEIN TRANSPOSITION LEFT ARM;  Surgeon: Serafina Mitchell, MD;  Location: [facility];  Service: Vascular;  Laterality: Left;   BREAST LUMPECTOMY Left x 2   many years apart, benign   CHOLECYSTECTOMY  1985   CYSTOSCOPY WITH URETEROSCOPY AND STENT PLACEMENT Left 06/18/2013   Procedure: CYSTOSCOPY WITH Lef URETEROSCOPY AND Left STENT PLACEMENT;  Surgeon: Dutch Gray, MD;  Location: [facility];  Service: Urology;  Laterality: Left;   ESOPHAGOGASTRODUODENOSCOPY (EGD) WITH PROPOFOL N/A 11/13/2016   Procedure: ESOPHAGOGASTRODUODENOSCOPY (EGD) WITH PROPOFOL;  Surgeon: Otis Brace, MD;  Location: [place];  Service: Gastroenterology;  Laterality: N/A;   EUS N/A 02/07/2016   Procedure: ESOPHAGEAL ENDOSCOPIC ULTRASOUND (EUS) RADIAL;  Surgeon: Arta Silence, MD;  Location: [facility];  Service: Endoscopy;  Laterality: N/A;   EYE SURGERY Bilateral 2014   ioc for catracts    FOOT SURGERY Left 1990   something with toes unsure what    HERNIA REPAIR  2018  IR EMBO TUMOR ORGAN ISCHEMIA INFARCT INC GUIDE ROADMAPPING  08/22/2021   IR RADIOLOGIST EVAL & MGMT  05/18/2021   IR RENAL SUPRASEL UNI S&I MOD SED  08/22/2021   IR US GUIDE VASC ACCESS RIGHT  08/22/2021   PARATHYROID EXPLORATION     RADIOLOGY WITH ANESTHESIA Left 08/22/2021   Procedure: MICROWAVE ABLATION;  Surgeon: Sandi Mariscal, MD;  Location: [facility];  Service: Anesthesiology;  Laterality: Left;   WHIPPLE PROCEDURE N/A 04/23/2016   Procedure: WHIPPLE PROCEDURE;  Surgeon: Stark Klein, MD;  Location: [place];  Service: General;  Laterality: N/A;   Social History:  reports that she has never smoked. She has never used smokeless  tobacco. She reports that she does not drink alcohol and does not use drugs.  Allergies  Allergen Reactions   Gemfibrozil Other (See Comments)    Myalgia    Lotemax [Loteprednol Etabonate] Other (See Comments)    burning   Statins Other (See Comments)    Muscle weakness   Doxycycline Other (See Comments)    UNSPECIFIED REACTION    Codeine Other (See Comments)    headache   Hydrocodone-Acetaminophen Other (See Comments)    Headache. Tolerates acetaminophen.    Family History  Problem Relation Age of Onset   Heart disease Mother    Stroke Mother    Cancer Father        colon   Alzheimer's disease Sister    Alzheimer's disease Sister    Cancer Brother        brain   Breast cancer Neg Hx     Prior to Admission medications   Medication Sig Start Date End Date Taking? Authorizing Provider  acetaminophen (TYLENOL 8 HOUR ARTHRITIS PAIN) 650 MG CR tablet Take 1,300 mg by mouth at bedtime.    [provider]  Biotin 10000 MCG TABS Take 20,000 mcg by mouth in the morning and at bedtime.    [provider]  colestipol (COLESTID) 1 g tablet Take 1 g by mouth 2 (two) times daily as needed (diarrhea).    [provider]  diclofenac sodium (VOLTAREN) 1 % GEL Apply 2 g topically 4 (four) times daily as needed (for hand pain).     [provider]  escitalopram (LEXAPRO) 20 MG tablet Take 20 mg by mouth at bedtime.    [provider]  insulin aspart (NOVOLOG FLEXPEN) 100 UNIT/ML FlexPen Inject 10 Units into the skin 3 (three) times daily with meals.    [provider]  lidocaine-prilocaine (EMLA) cream Apply 1 Application topically as needed (port access).    [provider]  Lifitegrast 5 % SOLN Place 1 drop into both eyes at bedtime.    [provider]  lipase/protease/amylase (CREON) 36000 UNITS CPEP capsule Take 62,376-28,315 Units by mouth See admin instructions. 72,000 units (3 caps) with each meal and 36,000 units (1  cap) with snacks    [provider]  midodrine (PROAMATINE) 10 MG tablet Take 10 mg by mouth every Monday, Wednesday, and Friday with hemodialysis.    [provider]  Multiple Vitamins-Minerals (PRESERVISION AREDS 2+MULTI VIT PO) Take 1 capsule by mouth in the morning and at bedtime.    [provider]  oxyCODONE-acetaminophen (PERCOCET) 5-325 MG tablet Take 1 tablet by mouth every 6 (six) hours as needed for severe pain. 08/23/21   Lura Em, PA  Propylene Glycol 0.6 % SOLN Place 1 drop into both eyes 4 (four) times daily as needed (for dry eyes).  [provider]  sevelamer carbonate (RENVELA) 800 MG tablet Take 1,600 mg by mouth 3 (three) times daily with meals.    [provider]    Physical Exam: Vitals:   08/25/21 2200 08/25/21 2230 08/25/21 2300 08/25/21 2330  BP: 111/77 105/65 109/67 101/69  Pulse: (!) 130 (!) 127 (!) 128 (!) 125  Resp: 16 18 (!) 22 (!) 22  Temp:      TempSrc:      SpO2: 95% 98% 93% 94%  Weight:      Height:       Constitutional: NAD, calm, comfortable Eyes: PERRL, lids and conjunctivae normal ENMT: Mucous membranes are moist. Posterior pharynx clear of any exudate or lesions.Normal dentition.  Neck: normal, supple, no masses, no thyromegaly Respiratory: clear to auscultation bilaterally, no wheezing, no crackles. Normal respiratory effort. No accessory muscle use.  Cardiovascular: IRR, IRR tachycardic Abdomen: no tenderness, no masses palpated. No hepatosplenomegaly. Bowel sounds positive.  Musculoskeletal: no clubbing / cyanosis. No joint deformity upper and lower extremities. Good ROM, no contractures. Normal muscle tone.  Skin: no rashes, lesions, ulcers. No induration Neurologic: CN 2-12 grossly intact. Sensation intact, DTR normal. Strength 5/5 in all 4.  Psychiatric: Normal judgment and insight. Alert and oriented x 3. Normal mood.   Data Reviewed:    CBC    Component Value Date/Time   WBC 13.1  (H) 08/25/2021 1917   RBC 3.66 (L) 08/25/2021 1917   HGB 10.7 (L) 08/25/2021 1917   HCT 33.2 (L) 08/25/2021 1917   HCT 28.0 (L) 10/05/2016 1310   PLT 219 08/25/2021 1917   MCV 90.7 08/25/2021 1917   MCH 29.2 08/25/2021 1917   MCHC 32.2 08/25/2021 1917   RDW 15.8 (H) 08/25/2021 1917   LYMPHSABS 1.0 08/25/2021 1917   MONOABS 0.7 08/25/2021 1917   EOSABS 0.0 08/25/2021 1917   BASOSABS 0.0 08/25/2021 1917      Latest Ref Rng & Units 08/25/2021    7:17 PM 08/22/2021    7:40 AM 02/23/2021   12:04 PM  BMP  Glucose 70 - 99 mg/dL 180  148  193   BUN 8 - 23 mg/dL 27  31  65   Creatinine 0.44 - 1.00 mg/dL 3.63  3.03  4.46   Sodium 135 - 145 mmol/L 134  138  138   Potassium 3.5 - 5.1 mmol/L 3.9  3.8  4.3   Chloride 98 - 111 mmol/L 89  98  107   CO2 22 - 32 mmol/L '27  29  21   '$ Calcium 8.9 - 10.3 mg/dL 8.7  9.1  7.6    8.7    CT C/A/P: 1) no PE 2) 22m pulm nodule 3) prior whipple (2018) 4) Renal mass that has been ablated and embolized (just done on 08/22/21) 5) no other acute abnormality  Trops 1098 and 925  Assessment and Plan: * Atrial fibrillation with RVR ([place] New onset A.Fib with RVR. HR 125 on cardizem gtt, improved from 180 on presentation. Continue cardizem gtt for the moment If unable to get <110 + asymptomatic with cardizem, call cards for further recs Heparin gtt for the moment, switch to eliquis before discharge (see NSTEMI below).  NSTEMI (non-ST elevated myocardial infarction) ([place] Unclear if plaque wall rupture or just demand ischemia. Concern for MI prior to todays A.Fib RVR as she was having severe CP last evening with HR of 100-110 only per Son at that time when he took her pulse, Son is paramedic. Trop down  trending in ED. CP free as long as she doesn't move around currently. ACS pathway Heparin gtt for a.fib and NSTEMI ASA 325 now and 81 daily H/o intolerance to statin: Check lipid pnl in AM Consider PCSK9 Plan per cards: 2d echo later today (Sunday) LHC  on Monday Will let pt eat and make NPO after MN Monday.  Pulmonary nodule 67m pulm nodule. Normally wouldn't need follow up per recs; however, Pt does have renal cell carcinoma. So follow up CT may be indicated in future, not clear on timing of that though (how many months) May wish to curbside onc to answer that question prior to discharge.  ESRD (end stage renal disease) ([place] Felt to be due to DM2 and HTN.  On dialysis since Jan. Call nephro in AM Looks like she takes midodrine on dialysis days.  Renal cell carcinoma of left kidney ([place] S/p ablation on 7/5.  Also had coil embolization. Okay per Dr. WPascal Luxto do anticoagulation (for a.fib).  Diabetes mellitus type 2, controlled ([place] Looks like she takes insulin with mealtimes only. Med-rec is pending Sensitive SSI AC for the moment  IPMN (intraductal papillary mucinous neoplasm) S/p Whipple procedure 2018, non malignant and no recurrence.      Advance Care Planning:   Code Status: Full Code Confirmed with son and patient  Consults: Cards  Family Communication: Son at bedside  Severity of Illness: The appropriate patient status for this patient is OBSERVATION. Observation status is judged to be reasonable and necessary in order to provide the required intensity of service to ensure the patient's safety. The patient's presenting symptoms, physical exam findings, and initial radiographic and laboratory data in the context of their medical condition is felt to place them at decreased risk for further clinical deterioration. Furthermore, it is anticipated that the patient will be medically stable for discharge from the hospital within 2 midnights of admission.   Author: GEtta Quill, DO 08/26/2021 12:38 AM  For on call review [email or web address]

## 2021-08-26 NOTE — Assessment & Plan Note (Signed)
Looks like she takes insulin with mealtimes only. Med-rec is pending Sensitive SSI AC for the moment

## 2021-08-26 NOTE — Assessment & Plan Note (Addendum)
Patient has converted to sinus rhythm. Continue to have chest pain, mainly pleuritic.  Plan to continue with diltiazem for rate control and anticoagulation with heparin. Possible transition to oral diltiazem in the next 24 hrs Continue telemetry monitoring.

## 2021-08-26 NOTE — Hospital Course (Signed)
Kim Burns was admitted to the hospital with the working diagnosis of atrial fibrillation with RVR.   86 yo female with the past medical history of ESRD on HD, IPMN sp whipple procedure 2018, renal cell carcinoma sp ablation and embolization per IR on 08/22/21. The night prior to admission she had severe chest pain, that persisted over the next morning. Associated with palpitations and was worse with exertion. On her initial physical examination her HR was 180 bpm, down to 130 on diltiazem infusion, blood pressure 111/77, RR 16 and 02 saturation 98%, lungs with no wheezing or rales, heart with S1 and S2 present irregularly irregular with no gallops, abdomen not distended and no lower extremity edema.   Na 134, K 3,9 Cl 89, bicarbonate 27 glucose 180, bun 27 cr 3,63  High sensitive troponin 1,098-925  Wbc 13,1 hgb 10,7 plt 219   Ct chest with no signs of pulmonary embolism.  3 mm left solid pulmonary nodule.  Ct abdomen and pelvis with no acute changes.  Stable solid mass in the medial aspect of the left kidney.   EKG 182 bpm, normal axis, qtc 556, atrial fibrillation rhythm with poor R wave progression, no significant ST segment or T wave changes.

## 2021-08-26 NOTE — Progress Notes (Addendum)
ANTICOAGULATION CONSULT NOTE   Pharmacy Consult for heparin Indication: atrial fibrillation  Allergies  Allergen Reactions   Gemfibrozil Other (See Comments)    Myalgia    Lotemax [Loteprednol Etabonate] Other (See Comments)    burning   Statins Other (See Comments)    Muscle weakness   Doxycycline Other (See Comments)    UNSPECIFIED REACTION    Codeine Other (See Comments)    headache   Hydrocodone-Acetaminophen Other (See Comments)    Headache. Tolerates acetaminophen.    Patient Measurements: Height: '5\' 2"'$  (157.5 cm) Weight: 56.2 kg (124 lb) IBW/kg (Calculated) : 50.1  Vital Signs: Temp: 98.5 F (36.9 C) (07/09 0338) Temp Source: Oral (07/09 0338) BP: 101/61 (07/09 0745) Pulse Rate: 71 (07/09 0745)  Labs: Recent Labs    08/25/21 1917 08/25/21 2118 08/26/21 0447 08/26/21 0804  HGB 10.7*  --  8.9*  --   HCT 33.2*  --  28.4*  --   PLT 219  --  207  --   HEPARINUNFRC  --   --   --  <0.10*  CREATININE 3.63*  --  3.87*  --   TROPONINIHS 1,098* 925*  --   --      Estimated Creatinine Clearance: 8.6 mL/min (A) (by C-G formula based on SCr of 3.87 mg/dL (H)).   Medical History: Past Medical History:  Diagnosis Date   Anemia of chronic disease    takes iron   Anxiety    Blood transfusion without reported diagnosis    Bradycardia    CKD (chronic kidney disease) stage 3, GFR 30-59 ml/min [facility])    sees dr Arty Baumgartner sees next in jan 2018   Diabetes mellitus without complication ([place])    became diabetic after Whipple procedure   Dialysis patient [facility])    M-W-F AV graft left arm   Diarrhea    Dysrhythmia 04/16/2016   bradycardia due to medication    GERD (gastroesophageal reflux disease)    Gout    Headache    History of kidney stones 06/2013   Hyperparathyroidism ([place])    Hypertension    PONV (postoperative nausea and vomiting)    Sleep apnea    no cpap machine. could not tolerate   Stroke ([place]) 04/16/2016   TIA 1995   Vitamin D deficiency      Assessment: 86yo female c/o rapid HR and chest tightness >> found to be in Afib with RVR, no known h/o Afib >> to begin heparin.  Initial heparin level undetectable, no infusion issues   Goal of Therapy:  Heparin level 0.3-0.7 units/ml Monitor platelets by anticoagulation protocol: Yes   Plan:  Heparin 2000 units IV x 1, Increase heparin gtt to 1000 units/hr F/u 8 hour heparin level  Bertis Ruddy, PharmD Clinical Pharmacist ED Pharmacist Phone # [phone] 08/26/2021 9:50 AM

## 2021-08-27 ENCOUNTER — Encounter (HOSPITAL_COMMUNITY): Payer: Self-pay | Admitting: Internal Medicine

## 2021-08-27 ENCOUNTER — Encounter (HOSPITAL_COMMUNITY): Payer: Self-pay

## 2021-08-27 ENCOUNTER — Inpatient Hospital Stay (HOSPITAL_COMMUNITY): Payer: Medicare PPO

## 2021-08-27 ENCOUNTER — Telehealth (HOSPITAL_COMMUNITY): Payer: Self-pay | Admitting: Pharmacy Technician

## 2021-08-27 ENCOUNTER — Encounter (HOSPITAL_COMMUNITY)
Admission: EM | Disposition: A | Discharge status: Home / Self Care | Payer: Self-pay | Source: Home / Self Care | Attending: Internal Medicine

## 2021-08-27 ENCOUNTER — Other Ambulatory Visit (HOSPITAL_COMMUNITY): Payer: Self-pay

## 2021-08-27 DIAGNOSIS — R7989 Other specified abnormal findings of blood chemistry: Secondary | ICD-10-CM

## 2021-08-27 DIAGNOSIS — E1122 Type 2 diabetes mellitus with diabetic chronic kidney disease: Secondary | ICD-10-CM | POA: Diagnosis not present

## 2021-08-27 DIAGNOSIS — I4891 Unspecified atrial fibrillation: Secondary | ICD-10-CM | POA: Diagnosis not present

## 2021-08-27 DIAGNOSIS — R778 Other specified abnormalities of plasma proteins: Secondary | ICD-10-CM

## 2021-08-27 DIAGNOSIS — R079 Chest pain, unspecified: Secondary | ICD-10-CM

## 2021-08-27 DIAGNOSIS — I480 Paroxysmal atrial fibrillation: Secondary | ICD-10-CM | POA: Diagnosis not present

## 2021-08-27 DIAGNOSIS — N186 End stage renal disease: Secondary | ICD-10-CM | POA: Diagnosis not present

## 2021-08-27 DIAGNOSIS — I2510 Atherosclerotic heart disease of native coronary artery without angina pectoris: Secondary | ICD-10-CM

## 2021-08-27 DIAGNOSIS — I214 Non-ST elevation (NSTEMI) myocardial infarction: Secondary | ICD-10-CM | POA: Diagnosis not present

## 2021-08-27 HISTORY — PX: LEFT HEART CATH AND CORONARY ANGIOGRAPHY: CATH118249

## 2021-08-27 LAB — GLUCOSE, CAPILLARY
Glucose-Capillary: 161 mg/dL — ABNORMAL HIGH (ref 70–99)
Glucose-Capillary: 155 mg/dL — ABNORMAL HIGH (ref 70–99)
Glucose-Capillary: 220 mg/dL — ABNORMAL HIGH (ref 70–99)
Glucose-Capillary: 55 mg/dL — ABNORMAL LOW (ref 70–99)
Glucose-Capillary: 88 mg/dL (ref 70–99)
Glucose-Capillary: 127 mg/dL — ABNORMAL HIGH (ref 70–99)

## 2021-08-27 LAB — BASIC METABOLIC PANEL
Anion gap: 18 — ABNORMAL HIGH (ref 5–15)
BUN: 50 mg/dL — ABNORMAL HIGH (ref 8–23)
CO2: 22 mmol/L (ref 22–32)
Calcium: 8.6 mg/dL — ABNORMAL LOW (ref 8.9–10.3)
Chloride: 92 mmol/L — ABNORMAL LOW (ref 98–111)
Creatinine, Ser: 4.98 mg/dL — ABNORMAL HIGH (ref 0.44–1.00)
GFR, Estimated: 8 mL/min — ABNORMAL LOW (ref >60)
Glucose, Bld: 159 mg/dL — ABNORMAL HIGH (ref 70–99)
Potassium: 3.8 mmol/L (ref 3.5–5.1)
Sodium: 132 mmol/L — ABNORMAL LOW (ref 135–145)

## 2021-08-27 LAB — CBC
HCT: 29.5 % — ABNORMAL LOW (ref 36.0–46.0)
Hemoglobin: 9.2 g/dL — ABNORMAL LOW (ref 12.0–15.0)
MCH: 28.9 pg (ref 26.0–34.0)
MCHC: 31.2 g/dL (ref 30.0–36.0)
MCV: 92.8 fL (ref 80.0–100.0)
Platelets: 246 10*3/uL (ref 150–400)
RBC: 3.18 MIL/uL — ABNORMAL LOW (ref 3.87–5.11)
RDW: 16 % — ABNORMAL HIGH (ref 11.5–15.5)
WBC: 8.9 10*3/uL (ref 4.0–10.5)
nRBC: 0 % (ref 0.0–0.2)

## 2021-08-27 LAB — HEPARIN LEVEL (UNFRACTIONATED): Heparin Unfractionated: <0.1 IU/mL — ABNORMAL LOW (ref 0.30–0.70)

## 2021-08-27 LAB — ECHOCARDIOGRAM COMPLETE
Area-P 1/2: 3.91 cm2
Calc EF: 60.6 %
Height: 62 in
S' Lateral: 2.6 cm
Single Plane A2C EF: 54 %
Single Plane A4C EF: 67.4 %
Weight: 2112 oz

## 2021-08-27 LAB — HEPATITIS C ANTIBODY: HCV Ab: NONREACTIVE

## 2021-08-27 LAB — HEPATITIS B SURFACE ANTIGEN: Hepatitis B Surface Ag: NONREACTIVE

## 2021-08-27 LAB — HEPATITIS B SURFACE ANTIBODY,QUALITATIVE: Hep B S Ab: NONREACTIVE

## 2021-08-27 LAB — HEPATITIS B CORE ANTIBODY, TOTAL: Hep B Core Total Ab: NONREACTIVE

## 2021-08-27 SURGERY — LEFT HEART CATH AND CORONARY ANGIOGRAPHY
Anesthesia: LOCAL

## 2021-08-27 MED ORDER — APIXABAN 2.5 MG PO TABS
2.5000 mg | ORAL_TABLET | Freq: Two times a day (BID) | ORAL | Status: DC
  Administered 2021-08-27 – 2021-08-29 (×3): 2.5 mg via ORAL
  Filled 2021-08-27 (×3): qty 1

## 2021-08-27 MED ORDER — HYDRALAZINE HCL 20 MG/ML IJ SOLN: 10.0000 mg | INTRAMUSCULAR | Status: AC | PRN

## 2021-08-27 MED ORDER — LIDOCAINE HCL (PF) 1 % IJ SOLN: 5.0000 mL | INTRAMUSCULAR | Status: DC | PRN

## 2021-08-27 MED ORDER — LIDOCAINE HCL (PF) 1 % IJ SOLN
INTRAMUSCULAR | Status: AC
  Filled 2021-08-27: qty 30

## 2021-08-27 MED ORDER — LIDOCAINE HCL (PF) 1 % IJ SOLN
INTRAMUSCULAR | Status: DC | PRN
  Administered 2021-08-27: 12 mL

## 2021-08-27 MED ORDER — HEPARIN (PORCINE) IN NACL 1000-0.9 UT/500ML-% IV SOLN
INTRAVENOUS | Status: AC
  Filled 2021-08-27: qty 1000

## 2021-08-27 MED ORDER — INSULIN ASPART 100 UNIT/ML IJ SOLN
0.0000 [IU] | Freq: Three times a day (TID) | INTRAMUSCULAR | Status: DC
  Administered 2021-08-27: 2 [IU] via SUBCUTANEOUS
  Administered 2021-08-27: 3 [IU] via SUBCUTANEOUS
  Administered 2021-08-28 (×2): 1 [IU] via SUBCUTANEOUS
  Administered 2021-08-29: 2 [IU] via SUBCUTANEOUS
  Administered 2021-08-30: 3 [IU] via SUBCUTANEOUS

## 2021-08-27 MED ORDER — LIDOCAINE-PRILOCAINE 2.5-2.5 % EX CREA
1.0000 | TOPICAL_CREAM | CUTANEOUS | Status: DC | PRN
  Filled 2021-08-27: qty 5

## 2021-08-27 MED ORDER — SODIUM CHLORIDE 0.9% FLUSH
3.0000 mL | Freq: Two times a day (BID) | INTRAVENOUS | Status: DC
  Administered 2021-08-27 – 2021-08-30 (×6): 3 mL via INTRAVENOUS

## 2021-08-27 MED ORDER — CHLORHEXIDINE GLUCONATE CLOTH 2 % EX PADS
6.0000 | MEDICATED_PAD | Freq: Every day | CUTANEOUS | Status: DC
  Administered 2021-08-28 – 2021-08-30 (×3): 6 via TOPICAL

## 2021-08-27 MED ORDER — SODIUM CHLORIDE 0.9% FLUSH: 3.0000 mL | INTRAVENOUS | Status: DC | PRN

## 2021-08-27 MED ORDER — MIDAZOLAM HCL 2 MG/2ML IJ SOLN
INTRAMUSCULAR | Status: DC | PRN
  Administered 2021-08-27: 1 mg via INTRAVENOUS

## 2021-08-27 MED ORDER — IOHEXOL 350 MG/ML SOLN
INTRAVENOUS | Status: DC | PRN
  Administered 2021-08-27: 40 mL

## 2021-08-27 MED ORDER — DARBEPOETIN ALFA 25 MCG/0.42ML IJ SOSY
25.0000 ug | PREFILLED_SYRINGE | INTRAMUSCULAR | Status: DC
  Administered 2021-08-28: 25 ug via INTRAVENOUS
  Filled 2021-08-27: qty 0.42

## 2021-08-27 MED ORDER — SODIUM CHLORIDE 0.9 % IV SOLN: INTRAVENOUS | Status: DC

## 2021-08-27 MED ORDER — VERAPAMIL HCL 2.5 MG/ML IV SOLN
INTRAVENOUS | Status: AC
  Filled 2021-08-27: qty 2

## 2021-08-27 MED ORDER — PENTAFLUOROPROP-TETRAFLUOROETH EX AERO: 1.0000 | INHALATION_SPRAY | CUTANEOUS | Status: DC | PRN

## 2021-08-27 MED ORDER — INSULIN ASPART 100 UNIT/ML IJ SOLN
4.0000 [IU] | Freq: Three times a day (TID) | INTRAMUSCULAR | Status: DC
  Administered 2021-08-27 – 2021-08-29 (×3): 4 [IU] via SUBCUTANEOUS

## 2021-08-27 MED ORDER — MIDAZOLAM HCL 2 MG/2ML IJ SOLN
INTRAMUSCULAR | Status: AC
  Filled 2021-08-27: qty 2

## 2021-08-27 MED ORDER — METOPROLOL SUCCINATE ER 25 MG PO TB24
12.5000 mg | ORAL_TABLET | Freq: Every day | ORAL | Status: DC
  Administered 2021-08-27 – 2021-08-29 (×3): 12.5 mg via ORAL
  Filled 2021-08-27 (×3): qty 1

## 2021-08-27 MED ORDER — HEPARIN SODIUM (PORCINE) 1000 UNIT/ML DIALYSIS
1000.0000 [IU] | INTRAMUSCULAR | Status: DC | PRN
Start: 2021-08-27 — End: 2021-08-28
  Filled 2021-08-27: qty 1

## 2021-08-27 MED ORDER — LABETALOL HCL 5 MG/ML IV SOLN: 10.0000 mg | INTRAVENOUS | Status: AC | PRN

## 2021-08-27 MED ORDER — ALTEPLASE 2 MG IJ SOLR
2.0000 mg | Freq: Once | INTRAMUSCULAR | Status: DC | PRN
  Filled 2021-08-27: qty 2

## 2021-08-27 MED ORDER — FENTANYL CITRATE (PF) 100 MCG/2ML IJ SOLN
INTRAMUSCULAR | Status: DC | PRN
  Administered 2021-08-27: 25 ug via INTRAVENOUS

## 2021-08-27 MED ORDER — HEPARIN (PORCINE) IN NACL 1000-0.9 UT/500ML-% IV SOLN
INTRAVENOUS | Status: DC | PRN
  Administered 2021-08-27 (×2): 500 mL

## 2021-08-27 MED ORDER — HEPARIN SODIUM (PORCINE) 1000 UNIT/ML IJ SOLN
INTRAMUSCULAR | Status: AC
  Filled 2021-08-27: qty 10

## 2021-08-27 MED ORDER — NA FERRIC GLUC CPLX IN SUCROSE 12.5 MG/ML IV SOLN
125.0000 mg | Freq: Once | INTRAVENOUS | Status: AC
Start: 2021-08-28 — End: 2021-08-28
  Administered 2021-08-28: 125 mg via INTRAVENOUS
  Filled 2021-08-27: qty 10

## 2021-08-27 MED ORDER — FENTANYL CITRATE (PF) 100 MCG/2ML IJ SOLN
INTRAMUSCULAR | Status: AC
  Filled 2021-08-27: qty 2

## 2021-08-27 MED ORDER — SODIUM CHLORIDE 0.9 % IV SOLN
250.0000 mL | INTRAVENOUS | Status: DC | PRN
Start: 2021-08-27 — End: 2021-08-31
  Administered 2021-08-31: 250 mL via INTRAVENOUS

## 2021-08-27 MED ORDER — RENA-VITE PO TABS
1.0000 | ORAL_TABLET | Freq: Every day | ORAL | Status: DC
  Administered 2021-08-27 – 2021-08-30 (×4): 1 via ORAL
  Filled 2021-08-27 (×4): qty 1

## 2021-08-27 SURGICAL SUPPLY — 11 items
CATH INFINITI 5FR MULTPACK ANG (CATHETERS) ×1 IMPLANT
CLOSURE MYNX CONTROL 5F (Vascular Products) ×1 IMPLANT
KIT HEART LEFT (KITS) ×2 IMPLANT
KIT MICROPUNCTURE NIT STIFF (SHEATH) ×1 IMPLANT
PACK CARDIAC CATHETERIZATION (CUSTOM PROCEDURE TRAY) ×2 IMPLANT
SHEATH PINNACLE 5F 10CM (SHEATH) ×1 IMPLANT
SHEATH PROBE COVER 6X72 (BAG) ×1 IMPLANT
TRANSDUCER W/STOPCOCK (MISCELLANEOUS) ×2 IMPLANT
TUBING CIL FLEX 10 FLL-RA (TUBING) ×2 IMPLANT
WIRE EMERALD 3MM-J .025X260CM (WIRE) IMPLANT
WIRE EMERALD 3MM-J .035X150CM (WIRE) ×1 IMPLANT

## 2021-08-27 NOTE — Discharge Instructions (Signed)
Information on my medicine - ELIQUIS (apixaban)  This medication education was reviewed with me or my healthcare representative as part of my discharge preparation.     Why was Eliquis prescribed for you? Eliquis was prescribed for you to reduce the risk of a blood clot forming that can cause a stroke if you have a medical condition called atrial fibrillation (a type of irregular heartbeat).  What do You need to know about Eliquis ? Take your Eliquis TWICE DAILY - one tablet in the morning and one tablet in the evening with or without food. If you have difficulty swallowing the tablet whole please discuss with your pharmacist how to take the medication safely.  Take Eliquis exactly as prescribed by your doctor and DO NOT stop taking Eliquis without talking to the doctor who prescribed the medication.  Stopping may increase your risk of developing a stroke.  Refill your prescription before you run out.  After discharge, you should have regular check-up appointments with your healthcare provider that is prescribing your Eliquis.  In the future your dose may need to be changed if your kidney function or weight changes by a significant amount or as you get older.  What do you do if you miss a dose? If you miss a dose, take it as soon as you remember on the same day and resume taking twice daily.  Do not take more than one dose of ELIQUIS at the same time to make up a missed dose.  Important Safety Information A possible side effect of Eliquis is bleeding. You should call your healthcare provider right away if you experience any of the following: Bleeding from an injury or your nose that does not stop. Unusual colored urine (red or dark brown) or unusual colored stools (red or black). Unusual bruising for unknown reasons. A serious fall or if you hit your head (even if there is no bleeding).  Some medicines may interact with Eliquis and might increase your risk of bleeding or clotting  while on Eliquis. To help avoid this, consult your healthcare provider or pharmacist prior to using any new prescription or non-prescription medications, including herbals, vitamins, non-steroidal anti-inflammatory drugs (NSAIDs) and supplements.  This website has more information on Eliquis (apixaban): http://www.eliquis.com/eliquis/home =======================================  Atrial Fibrillation    Atrial fibrillation is a type of heartbeat that is irregular or fast. If you have this condition, your heart beats without any order. This makes it hard for your heart to pump blood in a normal way. Atrial fibrillation may come and go, or it may become a long-lasting problem. If this condition is not treated, it can put you at higher risk for stroke, heart failure, and other heart problems.  What are the causes? This condition may be caused by diseases that damage the heart. They include: High blood pressure. Heart failure. Heart valve disease. Heart surgery. Other causes include: Diabetes. Thyroid disease. Being overweight. Kidney disease. Sometimes the cause is not known.  What increases the risk? You are more likely to develop this condition if: You are older. You smoke. You exercise often and very hard. You have a family history of this condition. You are a man. You use drugs. You drink a lot of alcohol. You have lung conditions, such as emphysema, pneumonia, or COPD. You have sleep apnea.  What are the signs or symptoms? Common symptoms of this condition include: A feeling that your heart is beating very fast. Chest pain or discomfort. Feeling short of breath. Suddenly feeling   light-headed or weak. Getting tired easily during activity. Fainting. Sweating. In some cases, there are no symptoms.  How is this treated? Treatment for this condition depends on underlying conditions and how you feel when you have atrial fibrillation. They include: Medicines to: Prevent  blood clots. Treat heart rate or heart rhythm problems. Using devices, such as a pacemaker, to correct heart rhythm problems. Doing surgery to remove the part of the heart that sends bad signals. Closing an area where clots can form in the heart (left atrial appendage). In some cases, your doctor will treat other underlying conditions.  Follow these instructions at home:  Medicines Take over-the-counter and prescription medicines only as told by your doctor. Do not take any new medicines without first talking to your doctor. If you are taking blood thinners: Talk with your doctor before you take any medicines that have aspirin or NSAIDs, such as ibuprofen, in them. Take your medicine exactly as told by your doctor. Take it at the same time each day. Avoid activities that could hurt or bruise you. Follow instructions about how to prevent falls. Wear a bracelet that says you are taking blood thinners. Or, carry a card that lists what medicines you take. Lifestyle         Do not use any products that have nicotine or tobacco in them. These include cigarettes, e-cigarettes, and chewing tobacco. If you need help quitting, ask your doctor. Eat heart-healthy foods. Talk with your doctor about the right eating plan for you. Exercise regularly as told by your doctor. Do not drink alcohol. Lose weight if you are overweight. Do not use drugs, including cannabis.  General instructions If you have a condition that causes breathing to stop for a short period of time (apnea), treat it as told by your doctor. Keep a healthy weight. Do not use diet pills unless your doctor says they are safe for you. Diet pills may make heart problems worse. Keep all follow-up visits as told by your doctor. This is important.  Contact a doctor if: You notice a change in the speed, rhythm, or strength of your heartbeat. You are taking a blood-thinning medicine and you get more bruising. You get tired more easily  when you move or exercise. You have a sudden change in weight.  Get help right away if:    You have pain in your chest or your belly (abdomen). You have trouble breathing. You have side effects of blood thinners, such as blood in your vomit, poop (stool), or pee (urine), or bleeding that cannot stop. You have any signs of a stroke. "BE FAST" is an easy way to remember the main warning signs: B - Balance. Signs are dizziness, sudden trouble walking, or loss of balance. E - Eyes. Signs are trouble seeing or a change in how you see. F - Face. Signs are sudden weakness or loss of feeling in the face, or the face or eyelid drooping on one side. A - Arms. Signs are weakness or loss of feeling in an arm. This happens suddenly and usually on one side of the body. S - Speech. Signs are sudden trouble speaking, slurred speech, or trouble understanding what people say. T - Time. Time to call emergency services. Write down what time symptoms started. You have other signs of a stroke, such as: A sudden, very bad headache with no known cause. Feeling like you may vomit (nausea). Vomiting. A seizure.  These symptoms may be an emergency. Do not wait to   see if the symptoms will go away. Get medical help right away. Call your local emergency services (911 in the [place] Do not drive yourself to the hospital. Summary Atrial fibrillation is a type of heartbeat that is irregular or fast. You are at higher risk of this condition if you smoke, are older, have diabetes, or are overweight. Follow your doctor's instructions about medicines, diet, exercise, and follow-up visits. Get help right away if you have signs or symptoms of a stroke. Get help right away if you cannot catch your breath, or you have chest pain or discomfort. This information is not intended to replace advice given to you by your health care provider. Make sure you discuss any questions you have with your health care provider. Document  Revised: 07/29/2018 Document Reviewed: 07/29/2018 Elsevier Patient Education  2020 [employer] Inc.    

## 2021-08-27 NOTE — Progress Notes (Addendum)
The patient has been seen in conjunction with Reino Bellis, NP-C.. All aspects of care have been considered and discussed. The patient has been personally interviewed, examined, and all clinical data has been reviewed.  Agree with management as outlined.   Progress Note  Patient Name: Kim Burns Date of Encounter: 08/27/2021  [facility] Cardiologist: Peter Martinique, MD   Subjective   No complaints, just returned from Cath Lab.  Inpatient Medications    Scheduled Meds:  aspirin EC  81 mg Oral Daily   escitalopram  20 mg Oral QHS   insulin aspart  0-9 Units Subcutaneous TID WC   lipase/protease/amylase  36,000 Units Oral With snacks   lipase/protease/amylase  72,000 Units Oral TID WC   midodrine  10 mg Oral Q M,W,F-HD   multivitamin  1 tablet Oral QHS   polyethylene glycol  17 g Oral Daily   sevelamer carbonate  1,600 mg Oral TID WC   sodium chloride flush  3 mL Intravenous Q12H   Continuous Infusions:  sodium chloride 10 mL/hr at 08/27/21 0516   sodium chloride     diltiazem (CARDIZEM) infusion 0 mg/hr (08/26/21 0744)   PRN Meds: sodium chloride, acetaminophen, hydrALAZINE, labetalol, ondansetron (ZOFRAN) IV, oxyCODONE-acetaminophen, sodium chloride flush   Vital Signs    Vitals:   08/27/21 0014 08/27/21 0315 08/27/21 0729 08/27/21 0827  BP: (!) 141/74 (!) 143/76  (!) 106/58  Pulse: 88 88  80  Resp: '20 18  20  '$ Temp: 98.3 F (36.8 C) 98.4 F (36.9 C)  98 F (36.7 C)  TempSrc: Oral Oral  Oral  SpO2: 95% 94% 96% 97%  Weight:      Height:        Intake/Output Summary (Last 24 hours) at 08/27/2021 1003 Last data filed at 08/27/2021 0951 Gross per 24 hour  Intake 523.2 ml  Output 310 ml  Net 213.2 ml      08/27/2021   12:09 AM 08/26/2021    4:11 PM 08/25/2021    7:25 PM  Last 3 Weights  Weight (lbs) 132 lb 131 lb 6.3 oz 124 lb  Weight (kg) 59.875 kg 59.6 kg 56.246 kg      Telemetry    Sinus rhythm- Personally Reviewed  ECG    No new  tracing  Physical Exam   GEN: No acute distress.   Neck: No JVD Cardiac: RRR, no murmurs, rubs, or gallops.  Respiratory: Clear to auscultation bilaterally. GI: Soft, nontender, non-distended.  Right femoral cath site stable. MS: No edema; No deformity. Neuro:  Nonfocal  Psych: Normal affect   Labs    High Sensitivity Troponin:   Recent Labs  Lab 08/25/21 1917 08/25/21 2118  TROPONINIHS 1,098* 925*     Chemistry Recent Labs  Lab 08/25/21 1917 08/26/21 0447 08/27/21 0348  NA 134* 132* 132*  K 3.9 4.0 3.8  CL 89* 93* 92*  CO2 '27 24 22  '$ GLUCOSE 180* 166* 159*  BUN 27* 34* 50*  CREATININE 3.63* 3.87* 4.98*  CALCIUM 8.7* 8.4* 8.6*  PROT 6.6  --   --   ALBUMIN 2.9*  --   --   AST 21  --   --   ALT 23  --   --   ALKPHOS 87  --   --   BILITOT 0.8  --   --   GFRNONAA 12* 11* 8*  ANIONGAP 18* 15 18*    Lipids  Recent Labs  Lab 08/26/21 0447  CHOL 95  TRIG 110  HDL 35*  LDLCALC 38  CHOLHDL 2.7    Hematology Recent Labs  Lab 08/25/21 1917 08/26/21 0447 08/27/21 0348  WBC 13.1* 9.6 8.9  RBC 3.66* 3.13* 3.18*  HGB 10.7* 8.9* 9.2*  HCT 33.2* 28.4* 29.5*  MCV 90.7 90.7 92.8  MCH 29.2 28.4 28.9  MCHC 32.2 31.3 31.2  RDW 15.8* 16.0* 16.0*  PLT 219 207 246   Thyroid No results for input(s): "TSH", "FREET4" in the last 168 hours.  BNPNo results for input(s): "BNP", "PROBNP" in the last 168 hours.  DDimer No results for input(s): "DDIMER" in the last 168 hours.   Radiology    CARDIAC CATHETERIZATION  Result Date: 08/27/2021   Prox RCA lesion is 20% stenosed.   Mid LM to Dist LM lesion is 20% stenosed.   Ost LAD lesion is 30% stenosed.   Mid Cx lesion is 20% stenosed.   Prox LAD lesion is 50% stenosed.   The left ventricular systolic function is normal.   LV end diastolic pressure is normal.   The left ventricular ejection fraction is 55-65% by visual estimate.   There is no mitral valve regurgitation. Mild non-obstructive CAD Normal LV systolic function  Elevated troponin and chest pain is likely due to demand ischemic in the setting of rapid atrial fibrillation. Recommendation: Medical management of CAD.   CT CHEST ABDOMEN PELVIS W CONTRAST  Result Date: 08/25/2021 CLINICAL DATA:  History of recent renal ablation with tachycardia, initial encounter EXAM: CT CHEST, ABDOMEN, AND PELVIS WITH CONTRAST TECHNIQUE: Multidetector CT imaging of the chest, abdomen and pelvis was performed following the standard protocol during bolus administration of intravenous contrast. RADIATION DOSE REDUCTION: This exam was performed according to the departmental dose-optimization program which includes automated exposure control, adjustment of the mA and/or kV according to patient size and/or use of iterative reconstruction technique. CONTRAST:  21m OMNIPAQUE IOHEXOL 300 MG/ML  SOLN COMPARISON:  None Available. FINDINGS: CT CHEST FINDINGS Cardiovascular: Scattered atherosclerotic calcifications are noted. No aneurysmal dilatation or dissection is noted. No cardiac enlargement is seen. The pulmonary artery shows a normal branching pattern bilaterally. No filling defect to suggest pulmonary embolism is noted. Scattered coronary calcifications are noted. Mediastinum/Nodes: Thoracic inlet is within normal limits. No hilar or mediastinal adenopathy is noted. The esophagus is within normal limits. Lungs/Pleura: Lungs are well aerated bilaterally. No focal infiltrate or sizable effusion is noted. Minimal scarring is noted in the right base. Tiny less than 4 mm nodule is noted in the superior segment of the left lower lobe. No other nodules are noted. Musculoskeletal: Degenerative changes of the thoracic spine are noted. No rib abnormality is noted. CT ABDOMEN PELVIS FINDINGS Hepatobiliary: No focal liver abnormality is seen. Status post cholecystectomy. No biliary dilatation. Pancreas: Postsurgical changes are noted consistent with prior Whipple. The residual pancreas is atrophic. Spleen:  Scattered cysts are noted within the spleen. Adrenals/Urinary Tract: Adrenal glands are within normal limits. Kidneys demonstrate multiple hypodense lesions throughout both kidneys. A mildly enhancing solid lesion is noted in the medial aspect of the left lower pole. This has undergone recent ablation and coil embolization. No new focal abnormality is noted. No obstructive changes are seen. The bladder is decompressed. Stomach/Bowel: No obstructive or inflammatory changes of the colon are seen. Postsurgical changes are noted consistent with the prior Whipple procedure. The appendix is not well visualized and may have been surgically removed. No inflammatory changes are seen. Visualized small bowel is within normal limits aside from the previously described  surgical change. Vascular/Lymphatic: Aortic atherosclerosis. No enlarged abdominal or pelvic lymph nodes. Reproductive: Status post hysterectomy. No adnexal masses. Other: No abdominal wall hernia or abnormality. No abdominopelvic ascites. Musculoskeletal: Degenerative changes of lumbar spine are noted. No acute bony abnormality is noted. IMPRESSION: CT of the chest: No evidence of pulmonary emboli. 3 mm left solid pulmonary nodule. No routine follow-up imaging is recommended per Fleischner Society Guidelines. These guidelines do not apply to immunocompromised patients and patients with cancer. Follow up in patients with significant comorbidities as clinically warranted. For lung cancer screening, adhere to Lung-RADS guidelines. Reference: Radiology. 2017; 284(1):228-43. CT of the abdomen and pelvis: Changes consistent with prior Whipple procedure. Stable solid mass in the medial aspect of the left kidney. This has recently undergone ablation as well as coil embolization. No acute abnormality noted. Electronically Signed   By: Inez Catalina M.D.   On: 08/25/2021 22:06    Cardiac Studies   Echo: 07/30/21  IMPRESSIONS     1. GLS was not performed      . Left  ventricular ejection fraction, by estimation, is 60 to 65%. The  left ventricle has normal function. The left ventricle has no regional  wall motion abnormalities. There is mild concentric left ventricular  hypertrophy. Left ventricular diastolic   parameters are consistent with Grade I diastolic dysfunction (impaired  relaxation).   2. Right ventricular systolic function is normal. The right ventricular  size is normal.   3. Left atrial size was mildly dilated.   4. The mitral valve is normal in structure. Mild mitral valve  regurgitation. No evidence of mitral stenosis. Moderate mitral annular  calcification.   5. The aortic valve is tricuspid. Aortic valve regurgitation is not  visualized. No aortic stenosis is present.   6. The inferior vena cava is normal in size with greater than 50%  respiratory variability, suggesting right atrial pressure of 3 mmHg.   FINDINGS   Left Ventricle: GLS was not performed.  Left ventricular ejection fraction, by estimation, is 60 to 65%. The left  ventricle has normal function. The left ventricle has no regional wall  motion abnormalities. The left ventricular internal cavity size was normal  in size. There is mild concentric  left ventricular hypertrophy. Left ventricular diastolic parameters are  consistent with Grade I diastolic dysfunction (impaired relaxation).  Indeterminate filling pressures.   Right Ventricle: The right ventricular size is normal. No increase in  right ventricular wall thickness. Right ventricular systolic function is  normal.   Left Atrium: Left atrial size was mildly dilated.   Right Atrium: Right atrial size was normal in size.   Pericardium: There is no evidence of pericardial effusion.   Mitral Valve: The mitral valve is normal in structure. Moderate mitral  annular calcification. Mild mitral valve regurgitation. No evidence of  mitral valve stenosis.   Tricuspid Valve: The tricuspid valve is normal in  structure. Tricuspid  valve regurgitation is not demonstrated. No evidence of tricuspid  stenosis.   Aortic Valve: The aortic valve is tricuspid. Aortic valve regurgitation is  not visualized. No aortic stenosis is present. Aortic valve mean gradient  measures 5.0 mmHg. Aortic valve peak gradient measures 8.6 mmHg.   Pulmonic Valve: The pulmonic valve was normal in structure. Pulmonic valve  regurgitation is not visualized. No evidence of pulmonic stenosis.   Aorta: The aortic root is normal in size and structure, the aortic arch  was not well visualized and the ascending aorta was not well visualized.  Ascending  aorta measurements are within normal limits for age when indexed  to body surface area.   Venous: The pulmonary veins were not well visualized. The inferior vena  cava is normal in size with greater than 50% respiratory variability,  suggesting right atrial pressure of 3 mmHg.   IAS/Shunts: The interatrial septum appears to be lipomatous. No atrial  level shunt detected by color flow Doppler.   Cath: 08/27/2021    Prox RCA lesion is 20% stenosed.   Mid LM to Dist LM lesion is 20% stenosed.   Ost LAD lesion is 30% stenosed.   Mid Cx lesion is 20% stenosed.   Prox LAD lesion is 50% stenosed.   The left ventricular systolic function is normal.   LV end diastolic pressure is normal.   The left ventricular ejection fraction is 55-65% by visual estimate.   There is no mitral valve regurgitation.   Mild non-obstructive CAD Normal LV systolic function Elevated troponin and chest pain is likely due to demand ischemic in the setting of rapid atrial fibrillation.    Recommendation: Medical management of CAD.   Diagnostic Dominance: Right   Patient Profile     86 y.o. female with end-stage renal disease, prior Whipple procedure 2014, A-fib paroxysmal with rapid ventricular response, chest pain, non-ST elevation myocardial infarction with troponin of 1000.   Assessment &  Plan    Elevated troponin Demand ischemia --High-sensitivity troponin peaked at 1098.  Underwent cardiac catheterization noted above with moderate nonobstructive CAD.  Recommendations for medical management.  -- We will hold aspirin given her chronic anemia and need to start Eliquis --Reported statin intolerance, consider addition of Zetia  Paroxysmal atrial fibrillation: auto converted, and maintaining SR -- would plan for Eliquis 2.'5mg'$  BID this evening pending stable cath site -- add Toprol 12.'5mg'$  daily in at bedtime given her soft blood pressures   Chronic anemia: Hemoglobin 10.7>> 8.9>> 9.2 --No reported bleeding  ESRD: Per nephrology --On midodrine on HD days  Renal cell carcinoma of left kidney Diabetes Pulmonary nodule  For questions or updates, please contact [facility] Please consult [email or web address] for contact info under        Signed, Reino Bellis, NP  08/27/2021, 10:03 AM

## 2021-08-27 NOTE — Progress Notes (Signed)
Progress Note   Patient: Kim Burns:[id number] DOB: 1935/05/22 DOA: 08/25/2021     1 DOS: the patient was seen and examined on 08/27/2021   Brief hospital course: Kim Burns was admitted to the hospital with the working diagnosis of atrial fibrillation with RVR.    86 yo female with the past medical history of ESRD on HD, IPMN sp whipple procedure 2018, renal cell carcinoma sp ablation and embolization per IR on 08/22/21. The night prior to admission she had severe chest pain, that persisted over the next morning. Associated with palpitations and was worse with exertion. On her initial physical examination her HR was 180 bpm, down to 130 on diltiazem infusion, blood pressure 111/77, RR 16 and 02 saturation 98%, lungs with no wheezing or rales, heart with S1 and S2 present irregularly irregular with no gallops, abdomen not distended and no lower extremity edema.   Assessment and Plan: Atrial fibrillation with RVR ([name]) Patient has converted to sinus rhythm. Chest pain improved.  Plan to continue with oral metopralol for rate control and anticoagulation with apixaban to start tonight. Continue telemetry monitoring.  PT eval Possible d/c tomorrow  NSTEMI (non-ST elevated myocardial infarction) ([name]) Significant elevation in high sensitive troponin. LHC today with non-obstructive CAD, etiology of nstemi thought to be demand. Chest pain resolved today. Intolerant to statins Plan to start apixaban tonight  Vision changes Patient reports that after her renal embolization/ablation procedure on 7/5 she has had vision changes wherein part of her field of vision moves and changes. No other neurologic symptoms - will check MRI  ESRD (end stage renal disease) ([name]) On [facility] dialysis, didn't get dialysis last Friday instead received on Saturday. Nephrology hasn't been consulted so I consulted them today for dialysis Resume midodrine on HD days.   Renal cell carcinoma of left kidney ([name]) S/p  ablation on 7/5.  Also had coil embolization. Okay per Kim Burns to do anticoagulation (for a.fib).  Diabetes mellitus type 2, controlled ([name]) Looks like she takes insulin with mealtimes only, 10. Here glucose mildly elevated on ssi Sensitive SSI AC for the moment, add aspart 4 with meals  IPMN (intraductal papillary mucinous neoplasm) S/p Whipple procedure 2018, non malignant and no recurrence.  Pulmonary nodule 66m pulm nodule. Normally wouldn't need follow up per recs; however, Pt does have renal cell carcinoma. So follow up CT may be indicated in future, not clear on timing of that though (how many months) May wish to curbside onc to answer that question prior to discharge.    Subjective: chest pain improved, tolerated cath, tolerated lunch  Physical Exam: Vitals:   08/27/21 0014 08/27/21 0315 08/27/21 0729 08/27/21 0827  BP: (!) 141/74 (!) 143/76  (!) 106/58  Pulse: 88 88  80  Resp: '20 18  20  '$ Temp: 98.3 F (36.8 C) 98.4 F (36.9 C)  98 F (36.7 C)  TempSrc: Oral Oral  Oral  SpO2: 95% 94% 96% 97%  Weight:      Height:       Neurology awake and alert ENT with no pallor Cardiovascular with S1 and S2 present and rhythmic, no gallops or murmurs Respiratory with no rales or wheezing Abdomen not distended, non-tender no lower extremity edema  Neuro: 5/5 upper and lower strength  Data Reviewed:    Family Communication: daughter updated @ bedside 7/10  Disposition: Status is: Observation The patient will require care spanning > 2 midnights and should be moved to inpatient because: need for inpatient procedure ([facility])  Planned Discharge Destination: Home    Author: Desma Maxim, MD 08/27/2021 1:23 PM  For on call review [email or web address].

## 2021-08-27 NOTE — Consult Note (Signed)
[facility] Renal Consultation Note    Indication for Consultation:  Management of ESRD/hemodialysis; anemia, hypertension/volume and secondary hyperparathyroidism  XTG:GYIRS, Mechele Claude, MD  HPI: Kim Burns is a 86 y.o. female with ESRD on HD MWF at [facility]. Her past medical history significant for HTN, DM, anemia, LVH, bradycardia, IPMN s/p whipple procedure 2018, renal cell carcinoma s/p ablation and embolization per IR on 08/22/21. She presents to the ED c/o chest pain. She was found to have new onset AF/RVR with HR in 180s. Cardiology was consulted. Patient was given ASA and IV Heparin and  was initiated. Patient converted back to SR and she was recently converted to Eliquis BID. Patient underwent left heart catheterization today which showed mild non-obstructive CAD with normal LV systolic function. Bedside 2D ECHO also completed today. Seen and examined at bedside. Her son and daughter-in-law also at bedside. She reports tolerating today's procedure well. She reports mild SOB with exertion. Denies CP, palpitations, and N/V currently. Her last HD was on 7/8 where she received full treatment. Labs include: Troponin 925, SrCr 4.98, BUN 50, K+ 3.8, Ca 8.6, Na 132, and Hgb 9.2. Plan for HD 1st case tomorrow 7/11.  Past Medical History:  Diagnosis Date   Anemia of chronic disease    takes iron   Anxiety    Blood transfusion without reported diagnosis    Bradycardia    Diabetes mellitus without complication ([place])    became diabetic after Whipple procedure   Diarrhea    Dysrhythmia 04/16/2016   bradycardia due to medication    ESRD on hemodialysis [facility])    M-W-F   GERD (gastroesophageal reflux disease)    Gout    Headache    History of kidney stones 06/2013   Hyperparathyroidism ([place])    Hypertension    PONV (postoperative nausea and vomiting)    Sleep apnea    no cpap machine. could not tolerate   Stroke ([place]) 04/16/2016   TIA 1995   Vitamin D  deficiency    Past Surgical History:  Procedure Laterality Date   ABDOMINAL HYSTERECTOMY  1985   complete   BACK SURGERY  8546   lower   BASCILIC VEIN TRANSPOSITION Left 04/24/2017   Procedure: LEFT ARM FIRST STAGE BASILIC VEIN TRANSPOSITION;  Surgeon: Serafina Mitchell, MD;  Location: [place];  Service: Vascular;  Laterality: Left;   [place] Left 07/10/2017   Procedure: SECOND STAGE BASILIC VEIN TRANSPOSITION LEFT ARM;  Surgeon: Serafina Mitchell, MD;  [facility];  Service: Vascular;  Laterality: Left;   BREAST LUMPECTOMY Left x 2   many years apart, benign   CHOLECYSTECTOMY  1985   CYSTOSCOPY WITH URETEROSCOPY AND STENT PLACEMENT Left 06/18/2013   Procedure: CYSTOSCOPY WITH Lef URETEROSCOPY AND Left STENT PLACEMENT;  Surgeon: Dutch Gray, MD;  Location[facility];  Service: Urology;  Laterality: Left;   ESOPHAGOGASTRODUODENOSCOPY (EGD) WITH PROPOFOL N/A 11/13/2016   Procedure: ESOPHAGOGASTRODUODENOSCOPY (EGD) WITH PROPOFOL;  Surgeon: Otis Brace, MD;  Location: [place];  Service: Gastroenterology;  Laterality: N/A;   EUS N/A 02/07/2016   Procedure: ESOPHAGEAL ENDOSCOPIC ULTRASOUND (EUS) RADIAL;  Surgeon: Arta Silence, MD;  [facility];  Service: Endoscopy;  Laterality: N/A;   EYE SURGERY Bilateral 2014   ioc for catracts    FOOT SURGERY Left 1990   something with toes unsure what    HERNIA REPAIR  2018   IR EMBO TUMOR ORGAN ISCHEMIA INFARCT INC GUIDE ROADMAPPING  08/22/2021  IR RADIOLOGIST EVAL & MGMT  05/18/2021   IR RENAL SUPRASEL UNI S&I MOD SED  08/22/2021   IR US GUIDE VASC ACCESS RIGHT  08/22/2021   PARATHYROID EXPLORATION     RADIOLOGY WITH ANESTHESIA Left 08/22/2021   Procedure: MICROWAVE ABLATION;  Surgeon: Sandi Mariscal, MD;  [facility];  Service: Anesthesiology;  Laterality: Left;   WHIPPLE PROCEDURE N/A 04/23/2016   Procedure: WHIPPLE PROCEDURE;  Surgeon: Stark Klein, MD;  Location: [place];  Service: General;  Laterality: N/A;    Family History  Problem Relation Age of Onset   Heart disease Mother    Stroke Mother    Cancer Father        colon   Alzheimer's disease Sister    Alzheimer's disease Sister    Cancer Brother        brain   Breast cancer Neg Hx    Social History:  reports that she has never smoked. She has never used smokeless tobacco. She reports that she does not drink alcohol and does not use drugs. Allergies  Allergen Reactions   Gemfibrozil Other (See Comments)    Myalgia    Lotemax [Loteprednol Etabonate] Other (See Comments)    burning   Statins Other (See Comments)    Muscle weakness   Doxycycline Other (See Comments)    UNSPECIFIED REACTION    Codeine Other (See Comments)    headache   Hydrocodone-Acetaminophen Other (See Comments)    Headache. Tolerates acetaminophen.   Prior to Admission medications   Medication Sig Start Date End Date Taking? Authorizing Provider  acetaminophen (TYLENOL 8 HOUR ARTHRITIS PAIN) 650 MG CR tablet Take 1,300 mg by mouth at bedtime.   Yes [provider]  Biotin 10000 MCG TABS Take 20,000 mcg by mouth in the morning and at bedtime.   Yes [provider]  colestipol (COLESTID) 1 g tablet Take 1 g by mouth 2 (two) times daily as needed (diarrhea).   Yes [provider]  diclofenac sodium (VOLTAREN) 1 % GEL Apply 2 g topically 4 (four) times daily as needed (for hand pain).    Yes [provider]  escitalopram (LEXAPRO) 20 MG tablet Take 20 mg by mouth at bedtime.   Yes [provider]  insulin aspart (NOVOLOG FLEXPEN) 100 UNIT/ML FlexPen Inject 10 Units into the skin 3 (three) times daily with meals.   Yes [provider]  lidocaine-prilocaine (EMLA) cream Apply 1 Application topically as needed (port access).   Yes [provider]  Lifitegrast 5 % SOLN Place 1 drop into both eyes at bedtime.   Yes [provider]  lipase/protease/amylase (CREON) 36000 UNITS CPEP capsule Take  78,588-50,277 Units by mouth See admin instructions. 72,000 units (3 caps) with each meal and 36,000 units (1 cap) with snacks   Yes [provider]  midodrine (PROAMATINE) 10 MG tablet Take 10 mg by mouth every Monday, Wednesday, and Friday with hemodialysis.   Yes [provider]  Multiple Vitamins-Minerals (PRESERVISION AREDS 2+MULTI VIT PO) Take 1 capsule by mouth in the morning and at bedtime.   Yes [provider]  oxyCODONE-acetaminophen (PERCOCET) 5-325 MG tablet Take 1 tablet by mouth every 6 (six) hours as needed for severe pain. 08/23/21  Yes Lura Em, PA  Propylene Glycol 0.6 % SOLN Place 1 drop into both eyes 4 (four) times daily as needed (for dry eyes).   Yes [provider]  sevelamer carbonate (RENVELA) 800 MG tablet Take 1,600 mg by  mouth 3 (three) times daily with meals.   Yes [provider]   Current Facility-Administered Medications  Medication Dose Route Frequency Provider Last Rate Last Admin   0.9 %  sodium chloride infusion   Intravenous Continuous Burnell Blanks, MD   Stopped at 08/27/21 0654   0.9 %  sodium chloride infusion  250 mL Intravenous PRN Burnell Blanks, MD       acetaminophen (TYLENOL) tablet 650 mg  650 mg Oral Q4H PRN Burnell Blanks, MD       alteplase (CATHFLO ACTIVASE) injection 2 mg  2 mg Intracatheter Once PRN Adelfa Koh, NP       apixaban Arne Cleveland) tablet 2.5 mg  2.5 mg Oral BID Belva Crome, MD       [START ON 08/28/2021] Chlorhexidine Gluconate Cloth 2 % PADS 6 each  6 each Topical Q0600 Adelfa Koh, NP       escitalopram (LEXAPRO) tablet 20 mg  20 mg Oral QHS Burnell Blanks, MD   20 mg at 08/26/21 2015   heparin injection 1,000 Units  1,000 Units Dialysis PRN Adelfa Koh, NP       insulin aspart (novoLOG) injection 0-9 Units  0-9 Units Subcutaneous TID WC Burnell Blanks, MD   2 Units at 08/27/21 0559   insulin aspart (novoLOG)  injection 4 Units  4 Units Subcutaneous TID WC Wouk, Ailene Rud, MD       lidocaine (PF) (XYLOCAINE) 1 % injection 5 mL  5 mL Intradermal PRN Adelfa Koh, NP       lidocaine-prilocaine (EMLA) cream 1 Application  1 Application Topical PRN Adelfa Koh, NP       lipase/protease/amylase (CREON) capsule 36,000 Units  36,000 Units Oral With snacks Burnell Blanks, MD   36,000 Units at 08/26/21 2014   lipase/protease/amylase (CREON) capsule 72,000 Units  72,000 Units Oral TID WC Burnell Blanks, MD   72,000 Units at 08/27/21 1111   metoprolol succinate (TOPROL-XL) 24 hr tablet 12.5 mg  12.5 mg Oral QHS Belva Crome, MD       midodrine (PROAMATINE) tablet 10 mg  10 mg Oral Q M,W,F-HD Burnell Blanks, MD   10 mg at 08/27/21 1111   multivitamin (RENA-VIT) tablet 1 tablet  1 tablet Oral QHS Wouk, Ailene Rud, MD       ondansetron [facility]) injection 4 mg  4 mg Intravenous Q6H PRN Burnell Blanks, MD       oxyCODONE-acetaminophen (PERCOCET/ROXICET) 5-325 MG per tablet 1 tablet  1 tablet Oral Q6H PRN Burnell Blanks, MD       pentafluoroprop-tetrafluoroeth (GEBAUERS) aerosol 1 Application  1 Application Topical PRN Adelfa Koh, NP       polyethylene glycol (MIRALAX / GLYCOLAX) packet 17 g  17 g Oral Daily Burnell Blanks, MD   17 g at 08/27/21 0830   sevelamer carbonate (RENVELA) tablet 1,600 mg  1,600 mg Oral TID WC Lauree Chandler D, MD   1,600 mg at 08/27/21 1111   sodium chloride flush (NS) 0.9 % injection 3 mL  3 mL Intravenous Q12H Lauree Chandler D, MD   3 mL at 08/27/21 1111   sodium chloride flush (NS) 0.9 % injection 3 mL  3 mL Intravenous PRN Burnell Blanks, MD       Labs: Basic Metabolic Panel: Recent Labs  Lab 08/25/21 1917 08/26/21 0447 08/27/21 0348  NA 134* 132* 132*  K 3.9 4.0  3.8  CL 89* 93* 92*  CO2 '27 24 22  '$ GLUCOSE 180* 166* 159*  BUN 27* 34* 50*  CREATININE 3.63* 3.87* 4.98*   CALCIUM 8.7* 8.4* 8.6*   Liver Function Tests: Recent Labs  Lab 08/25/21 1917  AST 21  ALT 23  ALKPHOS 87  BILITOT 0.8  PROT 6.6  ALBUMIN 2.9*   No results for input(s): "LIPASE", "AMYLASE" in the last 168 hours. No results for input(s): "AMMONIA" in the last 168 hours. CBC: Recent Labs  Lab 08/22/21 0740 08/25/21 1917 08/26/21 0447 08/27/21 0348  WBC 7.1 13.1* 9.6 8.9  NEUTROABS 5.1 11.3*  --   --   HGB 11.9* 10.7* 8.9* 9.2*  HCT 38.5 33.2* 28.4* 29.5*  MCV 92.8 90.7 90.7 92.8  PLT 227 219 207 246   Cardiac Enzymes: No results for input(s): "CKTOTAL", "CKMB", "CKMBINDEX", "TROPONINI" in the last 168 hours. CBG: Recent Labs  Lab 08/26/21 0744 08/26/21 1137 08/27/21 0553 08/27/21 1210 08/27/21 1544  GLUCAP 121* 158* 161* 155* 220*   Iron Studies: No results for input(s): "IRON", "TIBC", "TRANSFERRIN", "FERRITIN" in the last 72 hours. Studies/Results: CARDIAC CATHETERIZATION  Result Date: 08/27/2021   Prox RCA lesion is 20% stenosed.   Mid LM to Dist LM lesion is 20% stenosed.   Ost LAD lesion is 30% stenosed.   Mid Cx lesion is 20% stenosed.   Prox LAD lesion is 50% stenosed.   The left ventricular systolic function is normal.   LV end diastolic pressure is normal.   The left ventricular ejection fraction is 55-65% by visual estimate.   There is no mitral valve regurgitation. Mild non-obstructive CAD Normal LV systolic function Elevated troponin and chest pain is likely due to demand ischemic in the setting of rapid atrial fibrillation. Recommendation: Medical management of CAD.   CT CHEST ABDOMEN PELVIS W CONTRAST  Result Date: 08/25/2021 CLINICAL DATA:  History of recent renal ablation with tachycardia, initial encounter EXAM: CT CHEST, ABDOMEN, AND PELVIS WITH CONTRAST TECHNIQUE: Multidetector CT imaging of the chest, abdomen and pelvis was performed following the standard protocol during bolus administration of intravenous contrast. RADIATION DOSE REDUCTION: This  exam was performed according to the departmental dose-optimization program which includes automated exposure control, adjustment of the mA and/or kV according to patient size and/or use of iterative reconstruction technique. CONTRAST:  37m OMNIPAQUE IOHEXOL 300 MG/ML  SOLN COMPARISON:  None Available. FINDINGS: CT CHEST FINDINGS Cardiovascular: Scattered atherosclerotic calcifications are noted. No aneurysmal dilatation or dissection is noted. No cardiac enlargement is seen. The pulmonary artery shows a normal branching pattern bilaterally. No filling defect to suggest pulmonary embolism is noted. Scattered coronary calcifications are noted. Mediastinum/Nodes: Thoracic inlet is within normal limits. No hilar or mediastinal adenopathy is noted. The esophagus is within normal limits. Lungs/Pleura: Lungs are well aerated bilaterally. No focal infiltrate or sizable effusion is noted. Minimal scarring is noted in the right base. Tiny less than 4 mm nodule is noted in the superior segment of the left lower lobe. No other nodules are noted. Musculoskeletal: Degenerative changes of the thoracic spine are noted. No rib abnormality is noted. CT ABDOMEN PELVIS FINDINGS Hepatobiliary: No focal liver abnormality is seen. Status post cholecystectomy. No biliary dilatation. Pancreas: Postsurgical changes are noted consistent with prior Whipple. The residual pancreas is atrophic. Spleen: Scattered cysts are noted within the spleen. Adrenals/Urinary Tract: Adrenal glands are within normal limits. Kidneys demonstrate multiple hypodense lesions throughout both kidneys. A mildly enhancing solid lesion is noted in the medial  aspect of the left lower pole. This has undergone recent ablation and coil embolization. No new focal abnormality is noted. No obstructive changes are seen. The bladder is decompressed. Stomach/Bowel: No obstructive or inflammatory changes of the colon are seen. Postsurgical changes are noted consistent with the  prior Whipple procedure. The appendix is not well visualized and may have been surgically removed. No inflammatory changes are seen. Visualized small bowel is within normal limits aside from the previously described surgical change. Vascular/Lymphatic: Aortic atherosclerosis. No enlarged abdominal or pelvic lymph nodes. Reproductive: Status post hysterectomy. No adnexal masses. Other: No abdominal wall hernia or abnormality. No abdominopelvic ascites. Musculoskeletal: Degenerative changes of lumbar spine are noted. No acute bony abnormality is noted. IMPRESSION: CT of the chest: No evidence of pulmonary emboli. 3 mm left solid pulmonary nodule. No routine follow-up imaging is recommended per Fleischner Society Guidelines. These guidelines do not apply to immunocompromised patients and patients with cancer. Follow up in patients with significant comorbidities as clinically warranted. For lung cancer screening, adhere to Lung-RADS guidelines. Reference: Radiology. 2017; 284(1):228-43. CT of the abdomen and pelvis: Changes consistent with prior Whipple procedure. Stable solid mass in the medial aspect of the left kidney. This has recently undergone ablation as well as coil embolization. No acute abnormality noted. Electronically Signed   By: Inez Catalina M.D.   On: 08/25/2021 22:06    Physical Exam: Vitals:   08/27/21 0315 08/27/21 0729 08/27/21 0827 08/27/21 1546  BP: (!) 143/76  (!) 106/58 125/73  Pulse: 88  80 74  Resp: 18  20 (!) 21  Temp: 98.4 F (36.9 C)  98 F (36.7 C) 98 F (36.7 C)  TempSrc: Oral  Oral Oral  SpO2: 94% 96% 97% 99%  Weight:      Height:         General: Elderly female; NAD Head: NCAT sclera not icteric MMM Lungs: CTA bilaterally. No wheeze, rales or rhonchi. Breathing is unlabored. Heart: RRR. No murmur, rubs or gallops.  Abdomen: soft, non-tender Lower extremities: no edema, ischemic changes, or open wounds  Neuro: AAOx3. Moves all extremities spontaneously. Psych:   Responds to questions appropriately with a normal affect. Dialysis Access: L AVF (+) B/T  Dialysis Orders:  MWF- [facility] 3hrs107mn, BFR 350, DFR Auto 1.5,  EDW 57kg, 2K/ 2Ca Access: L AVF  Heparin 2000 units bolus with HD Mircera 75 mcg q4wks - new order has not been started yet Calcitriol 158m PO qHD- last 08/25/21  Last Labs: Hgb 9.2, K 3.8, Ca 8.6,   Assessment/Plan: NSTEMI-Cardiology following, s/p LHC today which showed non-obstructive CAD, intolerant to statin, starting Eliquis tonight BID Afib with RVR-Continue Metoprolol and Eliquis ESRD - on HD MWF, off schedule, plan for HD 1st case tomorrow morning 7/11 Hypertension/volume- BP relatively stable, euvolemic on exam Anemia of CKD -Hgb now 9.2, if patient remains inpatient, will give dose ESA/Fe with HD tomorrow Secondary Hyperparathyroidism - Will check PO4 in AM, Ca ok. Nutrition - Renal diet with fluid restriction Dispo-okay for discharge after HD tomorrow morning   CoTobie PoetNP [place]/11/2021, 4:03 PM

## 2021-08-27 NOTE — Progress Notes (Signed)
Per pt request phoned son, Lawrie Tunks this am regarding pt's cardiac cath procedure and anticipated time 07:30.

## 2021-08-27 NOTE — Progress Notes (Signed)
ANTICOAGULATION CONSULT NOTE - Follow Up Consult  Pharmacy Consult for heparin Indication:  NSTEMI/PAF  Labs: Recent Labs    08/25/21 1917 08/25/21 2118 08/26/21 0447 08/26/21 0804 08/26/21 1754 08/26/21 1957 08/27/21 0348  HGB 10.7*  --  8.9*  --   --   --  9.2*  HCT 33.2*  --  28.4*  --   --   --  29.5*  PLT 219  --  207  --   --   --  246  HEPARINUNFRC  --   --   --    < > <0.10* <0.10* <0.10*  CREATININE 3.63*  --  3.87*  --   --   --   --   TROPONINIHS 1,098* 925*  --   --   --   --   --    < > = values in this interval not displayed.    Assessment: 86yo female subtherapeutic on heparin after rate change; no infusion issues or signs of bleeding per RN.  Goal of Therapy:  Heparin level 0.3-0.7 units/ml   Plan:  Will increase heparin infusion by 3-4 units/kg/hr to 1400 units/hr and check level if cath is delayed.    Wynona Neat, PharmD, BCPS  08/27/2021,5:04 AM

## 2021-08-27 NOTE — TOC Benefit Eligibility Note (Signed)
Patient Teacher, English as a foreign language completed.    The patient is currently admitted and upon discharge could be taking Eliquis 2.5 mg tablets.  The current 30 day co-pay is, $40.00.   The patient is insured through [place], [place] Patient Advocate Specialist [place] Patient Advocate Team Direct Number: [phone]  Fax: [phone]

## 2021-08-27 NOTE — Telephone Encounter (Signed)
Pharmacy Patient Advocate Encounter  Insurance verification completed.    The patient is insured through [employer] part D   The patient is currently admitted and ran test claims for the following: Eliquis 2.5 mg.  Copays and coinsurance results were relayed to Inpatient clinical team.

## 2021-08-27 NOTE — Progress Notes (Signed)
ANTICOAGULATION CONSULT NOTE - Follow Up Consult  Pharmacy Consult for heparin > apixaban  Indication:  NSTEMI/PAF  Labs: Recent Labs    08/25/21 1917 08/25/21 2118 08/26/21 0447 08/26/21 0804 08/26/21 1754 08/26/21 1957 08/27/21 0348  HGB 10.7*  --  8.9*  --   --   --  9.2*  HCT 33.2*  --  28.4*  --   --   --  29.5*  PLT 219  --  207  --   --   --  246  HEPARINUNFRC  --   --   --    < > <0.10* <0.10* <0.10*  CREATININE 3.63*  --  3.87*  --   --   --  4.98*  TROPONINIHS 1,098* 925*  --   --   --   --   --    < > = values in this interval not displayed.     Assessment: 86yo female subtherapeutic on heparin after rate change; no infusion issues or signs of bleeding per RN. S/p cath non-obstructive CAD  Afib CVR  Start apixaban tonight  Age > 80 Cr > 1.5 wt < 60kg  Goal of Therapy:  Heparin level 0.3-0.7 units/ml   Plan:  Apixaban 2.'5mg'$  BID  Start tonight  Monitor s/s bleeding    Bonnita Nasuti Pharm.D. CPP, BCPS Clinical Pharmacist [phone] 08/27/2021 10:18 AM

## 2021-08-27 NOTE — TOC Initial Note (Signed)
Transition of Care [facility]) - Initial/Assessment Note    Patient Details  Name: Kim Burns MRN: [id number] Date of Birth: [birth year]  [facility] [place] CM/SW Contact:    Zenon Mayo, RN Phone Number: 08/27/2021, 10:18 AM  Clinical Narrative:                 Patient is from home alone, her daughter lives in [place] , she is a Education officer, museum and will be with the patient for the summer till school starts back, so she is her support person for right now.  Patient has a walker at home, she has PCP, she use the [facility] on [address].  She is s/p cath today which was clean.  TOC following.   Expected Discharge Plan: Home/Self Care Barriers to Discharge: Continued Medical Work up   Patient Goals and CMS Choice Patient states their goals for this hospitalization and ongoing recovery are:: return home   Choice offered to / list presented to : NA  Expected Discharge Plan and Services Expected Discharge Plan: Home/Self Care   Discharge Planning Services: CM Consult Post Acute Care Choice: NA                     DME Agency: NA       HH Arranged: NA          Prior Living Arrangements/Services   Lives with:: Self Patient language and need for interpreter reviewed:: Yes Do you feel safe going back to the place where you live?: Yes      Need for Family Participation in Patient Care: Yes (Comment) Care giver support system in place?: Yes (comment)   Criminal Activity/Legal Involvement Pertinent to Current Situation/Hospitalization: No - Comment as needed  Activities of Daily Living Home Assistive Devices/Equipment: CBG Meter, Eyeglasses, Blood pressure cuff ADL Screening (condition at time of admission) Patient's cognitive ability adequate to safely complete daily activities?: Yes Is the patient deaf or have difficulty hearing?: No Does the patient have difficulty seeing, even when wearing glasses/contacts?: No Does the patient have difficulty  concentrating, remembering, or making decisions?: No Patient able to express need for assistance with ADLs?: Yes Does the patient have difficulty dressing or bathing?: No Independently performs ADLs?: Yes (appropriate for developmental age) Does the patient have difficulty walking or climbing stairs?: No Weakness of Legs: None Weakness of Arms/Hands: None  Permission Sought/Granted                  Emotional Assessment Appearance:: Appears younger than stated age Attitude/Demeanor/Rapport: Engaged Affect (typically observed): Appropriate Orientation: : Oriented to  Time, Oriented to Situation, Oriented to Place, Oriented to Self Alcohol / Substance Use: Not Applicable Psych Involvement: No (comment)  Admission diagnosis:  End stage renal disease on dialysis ([place]) [N18.6, Z99.2] Atrial fibrillation with rapid ventricular response ([place]) [I48.91] Atrial fibrillation with RVR ([place]) [I48.91] Atrial fibrillation ([place]) [I48.91] Patient Active Problem List   Diagnosis Date Noted   Elevated troponin    Chest pain of uncertain etiology    NSTEMI (non-ST elevated myocardial infarction) ([place]) 08/26/2021   Pulmonary nodule 08/26/2021   Atrial fibrillation ([place]) 08/26/2021   ESRD (end stage renal disease) ([place]) 08/25/2021   Atrial fibrillation with RVR ([place]) 08/25/2021   Renal cell carcinoma of left kidney ([place]) 08/22/2021   Diabetes mellitus type 2, controlled ([place]) 11/14/2016   Acute GI bleeding 11/12/2016   Hypokalemia 11/12/2016   Melena    Symptomatic anemia    Acute  renal failure superimposed on chronic kidney disease ([place])    Acute renal failure (ARF) ([place]) 10/03/2016   GERD (gastroesophageal reflux disease) 04/29/2016   Hyperglycemia 04/29/2016   IPMN (intraductal papillary mucinous neoplasm) 04/23/2016   History of stroke 04/16/2016   Sepsis ([place]) 06/18/2013   Localized swelling, mass and lump, neck 03/29/2013   Sinus bradycardia 09/18/2012   Fatigue 09/18/2012   CKD  (chronic kidney disease) stage 3, GFR 30-59 ml/min (HCC) 09/18/2012   PCP:  [name], MD Pharmacy:   [place], [place] - [address] [place] [place] [address] Phone: [phone] Fax: [phone]  [facility] ([place], [facility] [address] [place] [address] [place] [address] Phone: [phone] Fax: [phone]     Social Determinants of Health (SDOH) Interventions    Readmission Risk Interventions    08/27/2021   10:16 AM  Readmission Risk Prevention Plan  Transportation Screening Complete  PCP or Specialist Appt within 3-5 Days Complete  HRI or [place] Complete  Social Work Consult for [place] Planning/Counseling Complete  Palliative Care Screening Not Applicable  Medication Review Press photographer) Complete

## 2021-08-27 NOTE — Progress Notes (Signed)
  Echocardiogram 2D Echocardiogram has been performed.  Fidel Levy 08/27/2021, 2:29 PM

## 2021-08-27 NOTE — Consult Note (Signed)
   [facility] Inpatient Consult   08/27/2021  Kim Burns [birth year] [id number]  [place] [facility] [ACO] Patient: [employer]  Primary Care Provider:  [name], MD   Patient screened for hospitalization with noted high risk score for unplanned readmission risk and  to assess for potential [place] [facility] service needs for post hospital transition.   On rounds patient asleep, was discussed in unit progression meeting.  Plan:  Continue to follow progress and disposition to assess for post hospital care management needs.    For questions contact:   Natividad Brood, RN BSN [facility] Liaison  [phone] business mobile phone Toll free office [phone]  Fax number: [phone] [email or web address]

## 2021-08-27 NOTE — Interval H&P Note (Signed)
History and Physical Interval Note:  08/27/2021 7:20 AM  Kim Burns  has presented today for surgery, with the diagnosis of NSTEMI.  The various methods of treatment have been discussed with the patient and family. After consideration of risks, benefits and other options for treatment, the patient has consented to  Procedure(s): LEFT HEART CATH AND CORONARY ANGIOGRAPHY (N/A) as a surgical intervention.  The patient's history has been reviewed, patient examined, no change in status, stable for surgery.  I have reviewed the patient's chart and labs.  Questions were answered to the patient's satisfaction.    Cath Lab Visit (complete for each Cath Lab visit)  Clinical Evaluation Leading to the Procedure:   ACS: Yes.    Non-ACS:    Anginal Classification: CCS III  Anti-ischemic medical therapy: No Therapy  Non-Invasive Test Results: No non-invasive testing performed  Prior CABG: No previous CABG        Lauree Chandler

## 2021-08-28 ENCOUNTER — Inpatient Hospital Stay (HOSPITAL_COMMUNITY): Payer: Medicare PPO

## 2021-08-28 DIAGNOSIS — E1122 Type 2 diabetes mellitus with diabetic chronic kidney disease: Secondary | ICD-10-CM | POA: Diagnosis not present

## 2021-08-28 DIAGNOSIS — I639 Cerebral infarction, unspecified: Secondary | ICD-10-CM

## 2021-08-28 DIAGNOSIS — R079 Chest pain, unspecified: Secondary | ICD-10-CM | POA: Diagnosis not present

## 2021-08-28 DIAGNOSIS — R778 Other specified abnormalities of plasma proteins: Secondary | ICD-10-CM

## 2021-08-28 DIAGNOSIS — I4891 Unspecified atrial fibrillation: Secondary | ICD-10-CM | POA: Diagnosis not present

## 2021-08-28 LAB — CBC
HCT: 27.3 % — ABNORMAL LOW (ref 36.0–46.0)
HCT: 26.3 % — ABNORMAL LOW (ref 36.0–46.0)
Hemoglobin: 8.7 g/dL — ABNORMAL LOW (ref 12.0–15.0)
Hemoglobin: 8.3 g/dL — ABNORMAL LOW (ref 12.0–15.0)
MCH: 28.7 pg (ref 26.0–34.0)
MCH: 27.9 pg (ref 26.0–34.0)
MCHC: 31.9 g/dL (ref 30.0–36.0)
MCHC: 31.6 g/dL (ref 30.0–36.0)
MCV: 90.1 fL (ref 80.0–100.0)
MCV: 88.6 fL (ref 80.0–100.0)
Platelets: 282 10*3/uL (ref 150–400)
Platelets: 292 10*3/uL (ref 150–400)
RBC: 3.03 MIL/uL — ABNORMAL LOW (ref 3.87–5.11)
RBC: 2.97 MIL/uL — ABNORMAL LOW (ref 3.87–5.11)
RDW: 15.9 % — ABNORMAL HIGH (ref 11.5–15.5)
RDW: 15.9 % — ABNORMAL HIGH (ref 11.5–15.5)
WBC: 7.8 10*3/uL (ref 4.0–10.5)
WBC: 8.3 10*3/uL (ref 4.0–10.5)
nRBC: 0 % (ref 0.0–0.2)
nRBC: 0 % (ref 0.0–0.2)

## 2021-08-28 LAB — GLUCOSE, CAPILLARY
Glucose-Capillary: 137 mg/dL — ABNORMAL HIGH (ref 70–99)
Glucose-Capillary: 143 mg/dL — ABNORMAL HIGH (ref 70–99)
Glucose-Capillary: 96 mg/dL (ref 70–99)
Glucose-Capillary: 224 mg/dL — ABNORMAL HIGH (ref 70–99)

## 2021-08-28 LAB — BASIC METABOLIC PANEL
Anion gap: 15 (ref 5–15)
BUN: 63 mg/dL — ABNORMAL HIGH (ref 8–23)
CO2: 22 mmol/L (ref 22–32)
Calcium: 8.3 mg/dL — ABNORMAL LOW (ref 8.9–10.3)
Chloride: 93 mmol/L — ABNORMAL LOW (ref 98–111)
Creatinine, Ser: 5.83 mg/dL — ABNORMAL HIGH (ref 0.44–1.00)
GFR, Estimated: 7 mL/min — ABNORMAL LOW (ref >60)
Glucose, Bld: 137 mg/dL — ABNORMAL HIGH (ref 70–99)
Potassium: 4.4 mmol/L (ref 3.5–5.1)
Sodium: 130 mmol/L — ABNORMAL LOW (ref 135–145)

## 2021-08-28 LAB — HEPATITIS B SURFACE ANTIBODY, QUANTITATIVE: Hep B S AB Quant (Post): <3.1 m[IU]/mL — ABNORMAL LOW (ref >9.9)

## 2021-08-28 LAB — LIPOPROTEIN A (LPA): Lipoprotein (a): 15.5 nmol/L (ref <75.0)

## 2021-08-28 MED ORDER — IOHEXOL 350 MG/ML SOLN
75.0000 mL | Freq: Once | INTRAVENOUS | Status: AC | PRN
  Administered 2021-08-28: 75 mL via INTRAVENOUS

## 2021-08-28 MED ORDER — MIDODRINE HCL 5 MG PO TABS
10.0000 mg | ORAL_TABLET | Freq: Once | ORAL | Status: AC
Start: 2021-08-28 — End: 2021-08-28
  Administered 2021-08-28: 10 mg via ORAL
  Filled 2021-08-28: qty 2

## 2021-08-28 NOTE — Consult Note (Signed)
Neurology Consultation  Reason for Consult: Strokes on MRI Referring Physician: Jamse Arn  CC: vision changes  History is obtained from:patient, daughter  HPI: Kim Burns is a 86 y.o. female ESRD on HD MWF, bradycardia, DM2 with CGM, IPMN s/p Whipple procedure in 2018, renal cell carcinoma s/p ablation and embolization procedure on 7/5 who initially presented to the ED with chest pain and palpitations on 7/8. On arrival to ED she was in afib with RVR with a rate of 180 and a troponin of 1098.  She was started on a Cardizem drip and subsequently converted to sinus bradycardia and became hypotensive. Cardiology was consulted and did a left heart cath and angio that showed non obstructive disease on 7/10.  She is now on low dose metoprolol with midodrine. Today she developed vision changes that she described as wavy and blurry vision. MRI shows scattered punctate infarcts in bilateral hemispheres. She has been started on 2.'5mg'$  of eliquis BID by cardiology and has the rest of her stroke workup pending. Of note, she had an episode of hypoglycemia post procedure that was corrected with carbohydrate intake.  She endorses some wavy and blurry vision, she denies diplopia, vision loss, nausea, or vomiting. She endorses numbness and tingling in her bilateral hands that started when she began dialysis. She was referred for a carpal tunnel work up by her PCP. She denies changes in her baseline sensation in her arms and legs. She denies weakness in her extremities as well. She does endorse pain with movement of her left lower extremity as a result from her renal cell ablation and embolization. She has previously seen Dr. Rexene Alberts with [facility] prior to her whipple procedure in 2017 for clearance to come off of Plavix for the procedure. She did have a TIA in 1996 that consisted of left sided weakness and pain and dysarthria that lasted for a few seconds. She was then started on plavix '75mg'$  daily and taken off aspirin.  She has been neurologically stable since then.   LKW: >24h ago IV thrombolysis given?: no, on Eliquis Premorbid modified Rankin scale (mRS):  2-Slight disability-UNABLE to perform all activities but does not need assistance   ROS: Full ROS was performed and is negative except as noted in the HPI.   Past Medical History:  Diagnosis Date   Anemia of chronic disease    takes iron   Anxiety    Blood transfusion without reported diagnosis    Bradycardia    Diabetes mellitus without complication ([place])    became diabetic after Whipple procedure   Diarrhea    Dysrhythmia 04/16/2016   bradycardia due to medication    ESRD on hemodialysis [place])    M-W-F   GERD (gastroesophageal reflux disease)    Gout    Headache    History of kidney stones 06/2013   Hyperparathyroidism ([place])    Hypertension    PONV (postoperative nausea and vomiting)    Sleep apnea    no cpap machine. could not tolerate   Stroke ([place]) 04/16/2016   TIA 1995   Vitamin D deficiency    Family History  Problem Relation Age of Onset   Heart disease Mother    Stroke Mother    Cancer Father        colon   Alzheimer's disease Sister    Alzheimer's disease Sister    Cancer Brother        brain   Breast cancer Neg Hx    Social History:   reports  that she has never smoked. She has never used smokeless tobacco. She reports that she does not drink alcohol and does not use drugs.  Medications  Current Facility-Administered Medications:    0.9 %  sodium chloride infusion, , Intravenous, Continuous, Burnell Blanks, MD, Stopped at 08/27/21 0654   0.9 %  sodium chloride infusion, 250 mL, Intravenous, PRN, Burnell Blanks, MD   acetaminophen (TYLENOL) tablet 650 mg, 650 mg, Oral, Q4H PRN, Burnell Blanks, MD   apixaban Arne [place]) tablet 2.5 mg, 2.5 mg, Oral, BID, Belva Crome, MD, 2.5 mg at 08/27/21 2152   Chlorhexidine Gluconate Cloth 2 % PADS 6 each, 6 each, Topical, Q0600, Adelfa Koh, NP, 6 each at 08/28/21 0636   Darbepoetin Alfa (ARANESP) injection 25 mcg, 25 mcg, Intravenous, Q Tue-HD, Adelfa Koh, NP, 25 mcg at 08/28/21 1013   escitalopram (LEXAPRO) tablet 20 mg, 20 mg, Oral, QHS, Burnell Blanks, MD, 20 mg at 08/27/21 2152   insulin aspart (novoLOG) injection 0-9 Units, 0-9 Units, Subcutaneous, TID WC, Burnell Blanks, MD, 1 Units at 08/28/21 1343   insulin aspart (novoLOG) injection 4 Units, 4 Units, Subcutaneous, TID WC, Wouk, Ailene Rud, MD, 4 Units at 08/28/21 1341   lipase/protease/amylase (CREON) capsule 36,000 Units, 36,000 Units, Oral, With snacks, Burnell Blanks, MD, 36,000 Units at 08/27/21 2149   lipase/protease/amylase (CREON) capsule 72,000 Units, 72,000 Units, Oral, TID WC, Burnell Blanks, MD, 72,000 Units at 08/28/21 1334   metoprolol succinate (TOPROL-XL) 24 hr tablet 12.5 mg, 12.5 mg, Oral, QHS, Belva Crome, MD, 12.5 mg at 08/27/21 2152   midodrine (PROAMATINE) tablet 10 mg, 10 mg, Oral, Q M,W,F-HD, Burnell Blanks, MD, 10 mg at 08/27/21 1111   multivitamin (RENA-VIT) tablet 1 tablet, 1 tablet, Oral, QHS, Wouk, Ailene Rud, MD, 1 tablet at 08/27/21 2152   ondansetron (ZOFRAN) injection 4 mg, 4 mg, Intravenous, Q6H PRN, Burnell Blanks, MD   oxyCODONE-acetaminophen (PERCOCET/ROXICET) 5-325 MG per tablet 1 tablet, 1 tablet, Oral, Q6H PRN, Burnell Blanks, MD   polyethylene glycol (MIRALAX / GLYCOLAX) packet 17 g, 17 g, Oral, Daily, Burnell Blanks, MD, 17 g at 08/27/21 0830   sevelamer carbonate (RENVELA) tablet 1,600 mg, 1,600 mg, Oral, TID WC, Burnell Blanks, MD, 1,600 mg at 08/28/21 1334   sodium chloride flush (NS) 0.9 % injection 3 mL, 3 mL, Intravenous, Q12H, Burnell Blanks, MD, 3 mL at 08/27/21 2152   sodium chloride flush (NS) 0.9 % injection 3 mL, 3 mL, Intravenous, PRN, Burnell Blanks, MD  Exam: Current vital signs: BP (!) 93/58 (BP  Location: Right Arm)   Pulse 68   Temp 97.9 F (36.6 C) (Oral)   Resp 17   Ht '5\' 2"'$  (1.575 m)   Wt 56 kg Comment: ? both were standing wt scale.  SpO2 100%   BMI 22.58 kg/m  Vital signs in last 24 hours: Temp:  [97.8 F (36.6 C)-99.5 F (37.5 C)] 97.9 F (36.6 C) (07/11 1320) Pulse Rate:  [62-81] 68 (07/11 1320) Resp:  [17-23] 17 (07/11 1320) BP: (93-153)/(58-82) 93/58 (07/11 1320) SpO2:  [82 %-100 %] 100 % (07/11 1320) Weight:  [49.7 kg-60.1 kg] 56 kg (07/11 1225)  GENERAL: Awake, alert in NAD HEENT: - Normocephalic and atraumatic, dry mm, no LN++, no Thyromegally LUNGS - Clear to auscultation bilaterally with no wheezes CV - S1S2 RRR, no m/r/g, equal pulses bilaterally. JVD noted on the right ABDOMEN - Soft, nontender, nondistended  with normoactive BS Ext: warm, well perfused, intact peripheral pulses,0 edema  NEURO:  Mental Status: AA&Ox3  Language: speech is clear.  Naming, repetition, fluency, and comprehension intact. Cranial Nerves: PERRL, EOMI, she does note wavy vision, no visual field deficit, no facial asymmetry, facial sensation intact, hearing intact, tongue/uvula/soft palate midline, normal sternocleidomastoid and trapezius muscle strength. No evidence of tongue atrophy or fibrillations Motor:  5/5 strength noted in all extremities LLE range of motion limited by pain Tone: is normal and bulk is normal Sensation- Intact to light touch bilaterally Coordination: FTN intact bilaterally, no ataxia in BLE. Gait- deferred  NIHSS-0  Labs I have reviewed labs in epic and the results pertinent to this consultation are:  CBC    Component Value Date/Time   WBC 8.3 08/28/2021 1610   RBC 2.97 (L) 08/28/2021 1610   HGB 8.3 (L) 08/28/2021 1610   HCT 26.3 (L) 08/28/2021 1610   HCT 28.0 (L) 10/05/2016 1310   PLT 292 08/28/2021 1610   MCV 88.6 08/28/2021 1610   MCH 27.9 08/28/2021 1610   MCHC 31.6 08/28/2021 1610   RDW 15.9 (H) 08/28/2021 1610   LYMPHSABS 1.0  08/25/2021 1917   MONOABS 0.7 08/25/2021 1917   EOSABS 0.0 08/25/2021 1917   BASOSABS 0.0 08/25/2021 1917    CMP     Component Value Date/Time   NA 130 (L) 08/28/2021 0457   K 4.4 08/28/2021 0457   CL 93 (L) 08/28/2021 0457   CO2 22 08/28/2021 0457   GLUCOSE 137 (H) 08/28/2021 0457   BUN 63 (H) 08/28/2021 0457   CREATININE 5.83 (H) 08/28/2021 0457   CALCIUM 8.3 (L) 08/28/2021 0457   CALCIUM 7.6 (L) 02/23/2021 1204   PROT 6.6 08/25/2021 1917   ALBUMIN 2.9 (L) 08/25/2021 1917   AST 21 08/25/2021 1917   ALT 23 08/25/2021 1917   ALKPHOS 87 08/25/2021 1917   BILITOT 0.8 08/25/2021 1917   GFRNONAA 7 (L) 08/28/2021 0457   GFRAA 13 (L) 11/12/2019 1240    Lipid Panel     Component Value Date/Time   CHOL 95 08/26/2021 0447   CHOL 133 08/09/2021 1143   TRIG 110 08/26/2021 0447   HDL 35 (L) 08/26/2021 0447   HDL 43 08/09/2021 1143   CHOLHDL 2.7 08/26/2021 0447   VLDL 22 08/26/2021 0447   LDLCALC 38 08/26/2021 0447   LDLCALC 68 08/09/2021 1143    2d Echo IMPRESSIONS  1. Right ventricular systolic function is moderately reduced. The right  ventricular size is mildly enlarged. There is moderately elevated  pulmonary artery systolic pressure. The estimated right ventricular  systolic pressure is 75.1 mmHg. Normal apical  function with free wall hypokinesis, consistent with McConnell's sign as  can be seen in acute PE (image 58). Consider evaluation for PE.   2. Left ventricular ejection fraction, by estimation, is 60 to 65%. The  left ventricle has normal function. The left ventricle has no regional  wall motion abnormalities. Left ventricular diastolic parameters are  consistent with Grade III diastolic  dysfunction (restrictive). Elevated left atrial pressure.   3. Left atrial size was severely dilated.   4. Right atrial size was severely dilated.   5. The mitral valve is degenerative. Mild mitral valve regurgitation. No  evidence of mitral stenosis. Moderate mitral  annular calcification.   6. Tricuspid valve regurgitation is mild to moderate.   7. The aortic valve is tricuspid. Aortic valve regurgitation is not  visualized. Aortic valve sclerosis is present, with no evidence  of aortic  valve stenosis.   8. The inferior vena cava is dilated in size with <50% respiratory  variability, suggesting right atrial pressure of 15 mmHg.    Imaging I have reviewed the images obtained: MRI examination of the brain- Scattered punctate infarcts in bilateral hemispheres CTA head and neck- pending  Assessment:  86 y.o. female ESRD on HD MWF, bradycardia, DM2 with CGM, IPMN s/p Whipple procedure in 2018, renal cell carcinoma s/p ablation and embolization procedure on 7/5 who initially presented to the ED with chest pain and palpitations on 7/8. On arrival to ED she was in afib with RVR with a rate of 180 and a troponin of 1098. She subsequently converted to sinus bradycardia while on a cardizem drip. She had a cardiac cath done on 7/10 and reported vision changes on 7/11. Imaging shows scattered punctate infarcts, likely cardio embolic in the setting of afib. She has Ha1C and LDL and vessel imaging ordered to complete the stroke workup.   Impression: Scattered punctate infarcts likely cardioembolic given new onset atrial fibrillation  Recommendations: - HgbA1c - LDL is at goal at <70, no home statin. No need for statin due to below goal LDL. - CTA head and neck - Frequent neuro checks - Eliquis 2.'5mg'$  BID-can continue due to small size of strokes and low likelihood of HT. - Risk factor modification - Telemetry monitoring - PT consult, OT consult, Speech consult - Stroke team to follow  D/W Dr Kathryne Sharper, DNP, FNP-BC [facility] Pager: [phone]   Attending Neurohospitalist Addendum Patient seen and examined with APP/Resident. Agree with the history and physical as documented above. Agree with the plan as documented, which I  helped formulate. I have independently reviewed the chart, obtained history, review of systems and examined the patient.I have personally reviewed pertinent head/neck/spine imaging (CT/MRI). Plan relayed to Dr Jamse Arn Please feel free to call with any questions.  -- Amie Portland, MD Neurologist [facility] Pager: [phone]

## 2021-08-28 NOTE — Progress Notes (Signed)
Progress Note  Patient Name: Kim Burns Date of Encounter: 08/28/2021  [facility] Cardiologist: Peter Martinique, MD   Subjective   Currently in dialysis.  No complaint of chest pain.  Maintaining normal sinus rhythm.  Inpatient Medications    Scheduled Meds:  apixaban  2.5 mg Oral BID   Chlorhexidine Gluconate Cloth  6 each Topical Q0600   darbepoetin (ARANESP) injection - DIALYSIS  25 mcg Intravenous Q Tue-HD   escitalopram  20 mg Oral QHS   insulin aspart  0-9 Units Subcutaneous TID WC   insulin aspart  4 Units Subcutaneous TID WC   lipase/protease/amylase  36,000 Units Oral With snacks   lipase/protease/amylase  72,000 Units Oral TID WC   metoprolol succinate  12.5 mg Oral QHS   midodrine  10 mg Oral Q M,W,F-HD   multivitamin  1 tablet Oral QHS   polyethylene glycol  17 g Oral Daily   sevelamer carbonate  1,600 mg Oral TID WC   sodium chloride flush  3 mL Intravenous Q12H   Continuous Infusions:  sodium chloride Stopped (08/27/21 0654)   sodium chloride     PRN Meds: sodium chloride, acetaminophen, alteplase, heparin, lidocaine (PF), lidocaine-prilocaine, ondansetron (ZOFRAN) IV, oxyCODONE-acetaminophen, pentafluoroprop-tetrafluoroeth, sodium chloride flush   Vital Signs    Vitals:   08/28/21 0930 08/28/21 1001 08/28/21 1030 08/28/21 1102  BP: 136/72 138/68 (!) 146/67 123/72  Pulse: 66 64 62 67  Resp: '18 19 18 '$ (!) 22  Temp:      TempSrc:      SpO2: 100% 100% 100% 100%  Weight:      Height:        Intake/Output Summary (Last 24 hours) at 08/28/2021 1122 Last data filed at 08/28/2021 0841 Gross per 24 hour  Intake 569.1 ml  Output 500 ml  Net 69.1 ml      08/28/2021    8:08 AM 08/28/2021    3:28 AM 08/27/2021   12:09 AM  Last 3 Weights  Weight (lbs) 109 lb 9.1 oz 132 lb 9.6 oz 132 lb  Weight (kg) 49.7 kg 60.147 kg 59.875 kg      Telemetry    Sinus bradycardia while on dialysis.- Personally Reviewed  ECG    Not repeated- Personally  Reviewed  Physical Exam  Frail GEN: No acute distress.   Neck: No JVD Cardiac: RRR, apical systolic murmurs, but no rub, or gallops.  Skin: Chronically ill appearing pale  Labs    High Sensitivity Troponin:   Recent Labs  Lab 08/25/21 1917 08/25/21 2118  TROPONINIHS 1,098* 925*     Chemistry Recent Labs  Lab 08/25/21 1917 08/26/21 0447 08/27/21 0348 08/28/21 0457  NA 134* 132* 132* 130*  K 3.9 4.0 3.8 4.4  CL 89* 93* 92* 93*  CO2 '27 24 22 22  '$ GLUCOSE 180* 166* 159* 137*  BUN 27* 34* 50* 63*  CREATININE 3.63* 3.87* 4.98* 5.83*  CALCIUM 8.7* 8.4* 8.6* 8.3*  PROT 6.6  --   --   --   ALBUMIN 2.9*  --   --   --   AST 21  --   --   --   ALT 23  --   --   --   ALKPHOS 87  --   --   --   BILITOT 0.8  --   --   --   GFRNONAA 12* 11* 8* 7*  ANIONGAP 18* 15 18* 15    Lipids  Recent Labs  Lab 08/26/21  0447  CHOL 95  TRIG 110  HDL 35*  LDLCALC 38  CHOLHDL 2.7    Hematology Recent Labs  Lab 08/26/21 0447 08/27/21 0348 08/28/21 0457  WBC 9.6 8.9 7.8  RBC 3.13* 3.18* 3.03*  HGB 8.9* 9.2* 8.7*  HCT 28.4* 29.5* 27.3*  MCV 90.7 92.8 90.1  MCH 28.4 28.9 28.7  MCHC 31.3 31.2 31.9  RDW 16.0* 16.0* 15.9*  PLT 207 246 282   Thyroid No results for input(s): "TSH", "FREET4" in the last 168 hours.  BNPNo results for input(s): "BNP", "PROBNP" in the last 168 hours.  DDimer No results for input(s): "DDIMER" in the last 168 hours.   Radiology    ECHOCARDIOGRAM COMPLETE  Result Date: 08/27/2021    ECHOCARDIOGRAM REPORT   Patient Name:   Kim Burns Date of Exam: 08/27/2021 Medical Rec #:  [id number]        Height:       62.0 in Accession #:    [id number]       Weight:       132.0 lb Date of Birth:  02/16/1936        BSA:          1.602 m Patient Age:    86 years         BP:           106/58 mmHg Patient Gender: F                HR:           78 bpm. Exam Location:  Inpatient Procedure: 2D Echo, Cardiac Doppler and Color Doppler Indications:    NSTEMI I21.4  History:         Patient has prior history of Echocardiogram examinations, most                 recent 08/08/2021. Stroke, Arrythmias:Bradycardia; Risk                 Factors:Diabetes, GERD, Hypertension and Sleep Apnea.  Sonographer:    Bernadene Person RDCS Referring Phys: [id number] [place]  1. Right ventricular systolic function is moderately reduced. The right ventricular size is mildly enlarged. There is moderately elevated pulmonary artery systolic pressure. The estimated right ventricular systolic pressure is 93.7 mmHg. Normal apical function with free wall hypokinesis, consistent with McConnell's sign as can be seen in acute PE (image 58). Consider evaluation for PE.  2. Left ventricular ejection fraction, by estimation, is 60 to 65%. The left ventricle has normal function. The left ventricle has no regional wall motion abnormalities. Left ventricular diastolic parameters are consistent with Grade III diastolic dysfunction (restrictive). Elevated left atrial pressure.  3. Left atrial size was severely dilated.  4. Right atrial size was severely dilated.  5. The mitral valve is degenerative. Mild mitral valve regurgitation. No evidence of mitral stenosis. Moderate mitral annular calcification.  6. Tricuspid valve regurgitation is mild to moderate.  7. The aortic valve is tricuspid. Aortic valve regurgitation is not visualized. Aortic valve sclerosis is present, with no evidence of aortic valve stenosis.  8. The inferior vena cava is dilated in size with <50% respiratory variability, suggesting right atrial pressure of 15 mmHg. FINDINGS  Left Ventricle: Left ventricular ejection fraction, by estimation, is 60 to 65%. The left ventricle has normal function. The left ventricle has no regional wall motion abnormalities. The left ventricular internal cavity size was normal in size. There is  no left ventricular hypertrophy. Left  ventricular diastolic parameters are consistent with Grade III diastolic  dysfunction (restrictive). Elevated left atrial pressure. Right Ventricle: The right ventricular size is mildly enlarged. No increase in right ventricular wall thickness. Right ventricular systolic function is moderately reduced. There is moderately elevated pulmonary artery systolic pressure. The tricuspid regurgitant velocity is 2.79 m/s, and with an assumed right atrial pressure of 15 mmHg, the estimated right ventricular systolic pressure is 85.2 mmHg. Left Atrium: Left atrial size was severely dilated. Right Atrium: Right atrial size was severely dilated. Pericardium: There is no evidence of pericardial effusion. Mitral Valve: The mitral valve is degenerative in appearance. Moderate mitral annular calcification. Mild mitral valve regurgitation. No evidence of mitral valve stenosis. Tricuspid Valve: The tricuspid valve is normal in structure. Tricuspid valve regurgitation is mild to moderate. Aortic Valve: The aortic valve is tricuspid. Aortic valve regurgitation is not visualized. Aortic valve sclerosis is present, with no evidence of aortic valve stenosis. Pulmonic Valve: The pulmonic valve was not well visualized. Pulmonic valve regurgitation is not visualized. Aorta: The aortic root and ascending aorta are structurally normal, with no evidence of dilitation. Venous: The inferior vena cava is dilated in size with less than 50% respiratory variability, suggesting right atrial pressure of 15 mmHg. IAS/Shunts: The interatrial septum was not well visualized.  LEFT VENTRICLE PLAX 2D LVIDd:         4.20 cm     Diastology LVIDs:         2.60 cm     LV e' medial:    6.46 cm/s LV PW:         0.80 cm     LV E/e' medial:  17.3 LV IVS:        0.70 cm     LV e' lateral:   8.25 cm/s LVOT diam:     1.90 cm     LV E/e' lateral: 13.6 LV SV:         67 LV SV Index:   42 LVOT Area:     2.84 cm  LV Volumes (MOD) LV vol d, MOD A2C: 52.2 ml LV vol d, MOD A4C: 58.5 ml LV vol s, MOD A2C: 24.0 ml LV vol s, MOD A4C: 19.1 ml LV SV MOD  A2C:     28.2 ml LV SV MOD A4C:     58.5 ml LV SV MOD BP:      33.7 ml RIGHT VENTRICLE RV S prime:     9.76 cm/s TAPSE (M-mode): 1.0 cm LEFT ATRIUM             Index        RIGHT ATRIUM           Index LA diam:        4.50 cm 2.81 cm/m   RA Area:     26.80 cm LA Vol (A2C):   87.2 ml 54.42 ml/m  RA Volume:   98.10 ml  61.23 ml/m LA Vol (A4C):   94.0 ml 58.67 ml/m LA Biplane Vol: 91.0 ml 56.80 ml/m  AORTIC VALVE LVOT Vmax:   119.00 cm/s LVOT Vmean:  80.000 cm/s LVOT VTI:    0.235 m  AORTA Ao Root diam: 3.20 cm Ao Asc diam:  3.50 cm MITRAL VALVE                TRICUSPID VALVE MV Area (PHT): 3.91 cm     TR Peak grad:   31.1 mmHg MV Decel Time: 194 msec     TR  Vmax:        279.00 cm/s MV E velocity: 112.00 cm/s MV A velocity: 40.30 cm/s   SHUNTS MV E/A ratio:  2.78         Systemic VTI:  0.24 m                             Systemic Diam: 1.90 cm Oswaldo Milian MD Electronically signed by Oswaldo Milian MD Signature Date/Time: 08/27/2021/5:23:14 PM    Final    CARDIAC CATHETERIZATION  Result Date: 08/27/2021   Prox RCA lesion is 20% stenosed.   Mid LM to Dist LM lesion is 20% stenosed.   Ost LAD lesion is 30% stenosed.   Mid Cx lesion is 20% stenosed.   Prox LAD lesion is 50% stenosed.   The left ventricular systolic function is normal.   LV end diastolic pressure is normal.   The left ventricular ejection fraction is 55-65% by visual estimate.   There is no mitral valve regurgitation. Mild non-obstructive CAD Normal LV systolic function Elevated troponin and chest pain is likely due to demand ischemic in the setting of rapid atrial fibrillation. Recommendation: Medical management of CAD.    Cardiac Studies   None  Patient Profile     86 y.o. female HTN, LVH and prior evaluation of bradycardia, IPMN s/p prior Whipple procedure (04/23/16) with pancreatectomy and partial bowel resection, L sided RCC s/p L renal bland embolization and microwave ablation (08/22/21), ESRD on iHD starting 02/2021  (MWF), GERD, distant prior TIA (1995) and DM2  who is being seen today for the evaluation of chest pain and new onset AF/RVR  Assessment & Plan   Spontaneous resolution of atrial fibrillation: Has high CHADS VASC score greater than 2.  Apixaban 2.5 mg twice daily started.  Continue low-dose metoprolol if blood pressure tolerates.  Midodrine has been added. End-stage kidney disease on chronic dialysis: Continue dialysis. Elevated troponin I: Demand ischemia related. Chest pain: Resolved with resolution of atrial fibs.  Cardiac cath demonstrated nonobstructive disease.  [facility] will sign off.   Medication Recommendations: Beta-blocker and anticoagulation Other recommendations (labs, testing, etc): No Follow up as an outpatient: Dr. Martinique as needed  For questions or updates, please contact [facility] Please consult [email or web address] for contact info under        Signed, Sinclair Grooms, MD  08/28/2021, 11:22 AM

## 2021-08-28 NOTE — Progress Notes (Addendum)
PROGRESS NOTE    Kim Burns  HFW:[id number]  DOB: 1935/11/04  DOA: 08/25/2021 PCP: [name], MD Outpatient Specialists:   Hospital course:  86 yo female with the past medical history of ESRD on HD, IPMN sp whipple procedure 2018, renal cell carcinoma sp ablation and embolization per IR on 08/22/21 was admitted 08/25/2021 with A-fib with RVR with spontaneous conversion to NSR.  Patient was noted to have elevated troponin thought to be secondary to demand ischemia.  Patient subsequently noted abnormalities with vision and MRI was ordered.  Subjective:  Patient states that she has no concerns other than ongoing changes with her vision.  Is willing to stay to get further work-up.   Objective: Vitals:   08/28/21 1150 08/28/21 1206 08/28/21 1225 08/28/21 1320  BP: 121/72 106/60  (!) 93/58  Pulse: 63 63  68  Resp: (!) '21 20  17  '$ Temp:  97.9 F (36.6 C)  97.9 F (36.6 C)  TempSrc:  Oral  Oral  SpO2: 100% 100%  100%  Weight:   56 kg   Height:        Intake/Output Summary (Last 24 hours) at 08/28/2021 1713 Last data filed at 08/28/2021 1500 Gross per 24 hour  Intake 580 ml  Output 492.5 ml  Net 87.5 ml   Filed Weights   08/28/21 0328 08/28/21 0808 08/28/21 1225  Weight: 60.1 kg 49.7 kg 56 kg     Exam:  General: Patient with disconjugate gaze and mild possible mild exophthalmos on right eye. Eyes: sclera anicteric, conjuctiva mild injection bilaterally CVS: S1-S2, regular  Respiratory:  decreased air entry bilaterally secondary to decreased inspiratory effort, rales at bases  GI: NABS, soft, NT  LE: No edema.  Neuro: A/O x 3, Moving all extremities equally with normal strength, CN 3-12 intact, grossly nonfocal.  Psych: patient is logical and coherent, judgement and insight appear normal, mood and affect appropriate to situation.   Assessment & Plan:   Vision changes MRI with punctate foci with restricted diffusion Discussed with neurology, they will  see her in consultation tomorrow CTA of head and neck ordered Hemoglobin A1c and lipids ordered for a.m. Symptoms started after she had ablation with coil embolization for RCC of the left kidney  A-fib with RVR Spontaneous conversion to sinus rhythm Continue metoprolol for rate control Eliquis for secondary prevention of stroke.  Elevated troponin LHC with nonobstructive CAD NSTEMI thought to be secondary to demand ischemia Patient is intolerant to statins  RCC of left kidney S/p ablation on 7 5 with coil embolization Dr. Pascal Lux cleared anticoagulation/Eliquis  ESRD HD per nephrology Midodrine on HD days    DVT prophylaxis: Eliquis Code Status: Full Family Communication: Patient's daughter was at bedside Disposition Plan:   Patient is from: Home  Anticipated Discharge Location: Home  Barriers to Discharge: Awaiting neuro evaluation  Is patient medically stable for Discharge: Not yet   Scheduled Meds:  apixaban  2.5 mg Oral BID   Chlorhexidine Gluconate Cloth  6 each Topical Q0600   darbepoetin (ARANESP) injection - DIALYSIS  25 mcg Intravenous Q Tue-HD   escitalopram  20 mg Oral QHS   insulin aspart  0-9 Units Subcutaneous TID WC   insulin aspart  4 Units Subcutaneous TID WC   lipase/protease/amylase  36,000 Units Oral With snacks   lipase/protease/amylase  72,000 Units Oral TID WC   metoprolol succinate  12.5 mg Oral QHS   midodrine  10 mg Oral Q M,W,F-HD   multivitamin  1 tablet Oral QHS   polyethylene glycol  17 g Oral Daily   sevelamer carbonate  1,600 mg Oral TID WC   sodium chloride flush  3 mL Intravenous Q12H   Continuous Infusions:  sodium chloride Stopped (08/27/21 0654)   sodium chloride      Data Reviewed:  Basic Metabolic Panel: Recent Labs  Lab 08/22/21 0740 08/25/21 1917 08/26/21 0447 08/27/21 0348 08/28/21 0457  NA 138 134* 132* 132* 130*  K 3.8 3.9 4.0 3.8 4.4  CL 98 89* 93* 92* 93*  CO2 '29 27 24 22 22  '$ GLUCOSE 148* 180* 166* 159*  137*  BUN 31* 27* 34* 50* 63*  CREATININE 3.03* 3.63* 3.87* 4.98* 5.83*  CALCIUM 9.1 8.7* 8.4* 8.6* 8.3*    CBC: Recent Labs  Lab 08/22/21 0740 08/25/21 1917 08/26/21 0447 08/27/21 0348 08/28/21 0457 08/28/21 1610  WBC 7.1 13.1* 9.6 8.9 7.8 8.3  NEUTROABS 5.1 11.3*  --   --   --   --   HGB 11.9* 10.7* 8.9* 9.2* 8.7* 8.3*  HCT 38.5 33.2* 28.4* 29.5* 27.3* 26.3*  MCV 92.8 90.7 90.7 92.8 90.1 88.6  PLT 227 219 207 246 282 292    Studies: MR BRAIN WO CONTRAST  Result Date: 08/28/2021 CLINICAL DATA:  Binocular visual field changes.  Vision loss. EXAM: MRI HEAD WITHOUT CONTRAST TECHNIQUE: Multiplanar, multiecho pulse sequences of the brain and surrounding structures were obtained without intravenous contrast. COMPARISON:  MR head without contrast 03/24/2018 FINDINGS: Brain: Scattered punctate foci of restricted diffusion are noted. A focal lesion is noted along the postcentral gyrus on image 88 of series 5 subcortical punctate focus of restricted diffusion is present in the left parietal lobe on image 83 of series 5 with minimal associated T2 hyperintensity punctate focus is present in the right temporoparietal lobe on image 75 of series 5. A punctate focus is present in the right superior cerebellum on image 62 of series 5. The remote foci of susceptibility most notably along the right splenium of the corpus callosum are stable. Additional lesions are present in the right cerebellum and left frontal lobe. A The internal auditory canals are within normal limits. Other remote lacunar infarcts are present in the cerebellum bilaterally. Brainstem is within normal limits. The ventricles are of normal size. No significant extraaxial fluid collection is present. Vascular: Flow is present in the major intracranial arteries. Skull and upper cervical spine: Degenerative changes of the upper cervical spine have progressed. Craniocervical junction is normal. Marrow signal is normal. Sinuses/Orbits: The  paranasal sinuses and mastoid air cells are clear. Bilateral lens replacements are noted. Globes and orbits are otherwise unremarkable. IMPRESSION: 1. Scattered punctate foci of restricted diffusion as described. The finding is nonspecific but can be seen in the setting of acute/subacute ischemia, a demyelinating process such as multiple sclerosis, vasculitis, complicated migraine headaches, or as the sequelae of a prior infectious or inflammatory process. 2. Other remote lacunar infarcts of the cerebellum bilaterally. 3. Progressive degenerative changes of the upper cervical spine. Electronically Signed   By: San Morelle M.D.   On: 08/28/2021 15:20   ECHOCARDIOGRAM COMPLETE  Result Date: 08/27/2021    ECHOCARDIOGRAM REPORT   Patient Name:   Morrison P Finamore Date of Exam: 08/27/2021 Medical Rec #:  [id number]        Height:       62.0 in Accession #:    [id number]       Weight:       132.0 lb  Date of Birth:  04-09-1935        BSA:          1.602 m Patient Age:    67 years         BP:           106/58 mmHg Patient Gender: F                HR:           78 bpm. Exam Location:  Inpatient Procedure: 2D Echo, Cardiac Doppler and Color Doppler Indications:    NSTEMI I21.4  History:        Patient has prior history of Echocardiogram examinations, most                 recent 08/08/2021. Stroke, Arrythmias:Bradycardia; Risk                 Factors:Diabetes, GERD, Hypertension and Sleep Apnea.  Sonographer:    Bernadene Person RDCS Referring Phys: [id number] [place]  1. Right ventricular systolic function is moderately reduced. The right ventricular size is mildly enlarged. There is moderately elevated pulmonary artery systolic pressure. The estimated right ventricular systolic pressure is 54.0 mmHg. Normal apical function with free wall hypokinesis, consistent with McConnell's sign as can be seen in acute PE (image 58). Consider evaluation for PE.  2. Left ventricular ejection fraction, by  estimation, is 60 to 65%. The left ventricle has normal function. The left ventricle has no regional wall motion abnormalities. Left ventricular diastolic parameters are consistent with Grade III diastolic dysfunction (restrictive). Elevated left atrial pressure.  3. Left atrial size was severely dilated.  4. Right atrial size was severely dilated.  5. The mitral valve is degenerative. Mild mitral valve regurgitation. No evidence of mitral stenosis. Moderate mitral annular calcification.  6. Tricuspid valve regurgitation is mild to moderate.  7. The aortic valve is tricuspid. Aortic valve regurgitation is not visualized. Aortic valve sclerosis is present, with no evidence of aortic valve stenosis.  8. The inferior vena cava is dilated in size with <50% respiratory variability, suggesting right atrial pressure of 15 mmHg. FINDINGS  Left Ventricle: Left ventricular ejection fraction, by estimation, is 60 to 65%. The left ventricle has normal function. The left ventricle has no regional wall motion abnormalities. The left ventricular internal cavity size was normal in size. There is  no left ventricular hypertrophy. Left ventricular diastolic parameters are consistent with Grade III diastolic dysfunction (restrictive). Elevated left atrial pressure. Right Ventricle: The right ventricular size is mildly enlarged. No increase in right ventricular wall thickness. Right ventricular systolic function is moderately reduced. There is moderately elevated pulmonary artery systolic pressure. The tricuspid regurgitant velocity is 2.79 m/s, and with an assumed right atrial pressure of 15 mmHg, the estimated right ventricular systolic pressure is 08.6 mmHg. Left Atrium: Left atrial size was severely dilated. Right Atrium: Right atrial size was severely dilated. Pericardium: There is no evidence of pericardial effusion. Mitral Valve: The mitral valve is degenerative in appearance. Moderate mitral annular calcification. Mild mitral  valve regurgitation. No evidence of mitral valve stenosis. Tricuspid Valve: The tricuspid valve is normal in structure. Tricuspid valve regurgitation is mild to moderate. Aortic Valve: The aortic valve is tricuspid. Aortic valve regurgitation is not visualized. Aortic valve sclerosis is present, with no evidence of aortic valve stenosis. Pulmonic Valve: The pulmonic valve was not well visualized. Pulmonic valve regurgitation is not visualized. Aorta: The aortic root and ascending aorta are structurally  normal, with no evidence of dilitation. Venous: The inferior vena cava is dilated in size with less than 50% respiratory variability, suggesting right atrial pressure of 15 mmHg. IAS/Shunts: The interatrial septum was not well visualized.  LEFT VENTRICLE PLAX 2D LVIDd:         4.20 cm     Diastology LVIDs:         2.60 cm     LV e' medial:    6.46 cm/s LV PW:         0.80 cm     LV E/e' medial:  17.3 LV IVS:        0.70 cm     LV e' lateral:   8.25 cm/s LVOT diam:     1.90 cm     LV E/e' lateral: 13.6 LV SV:         67 LV SV Index:   42 LVOT Area:     2.84 cm  LV Volumes (MOD) LV vol d, MOD A2C: 52.2 ml LV vol d, MOD A4C: 58.5 ml LV vol s, MOD A2C: 24.0 ml LV vol s, MOD A4C: 19.1 ml LV SV MOD A2C:     28.2 ml LV SV MOD A4C:     58.5 ml LV SV MOD BP:      33.7 ml RIGHT VENTRICLE RV S prime:     9.76 cm/s TAPSE (M-mode): 1.0 cm LEFT ATRIUM             Index        RIGHT ATRIUM           Index LA diam:        4.50 cm 2.81 cm/m   RA Area:     26.80 cm LA Vol (A2C):   87.2 ml 54.42 ml/m  RA Volume:   98.10 ml  61.23 ml/m LA Vol (A4C):   94.0 ml 58.67 ml/m LA Biplane Vol: 91.0 ml 56.80 ml/m  AORTIC VALVE LVOT Vmax:   119.00 cm/s LVOT Vmean:  80.000 cm/s LVOT VTI:    0.235 m  AORTA Ao Root diam: 3.20 cm Ao Asc diam:  3.50 cm MITRAL VALVE                TRICUSPID VALVE MV Area (PHT): 3.91 cm     TR Peak grad:   31.1 mmHg MV Decel Time: 194 msec     TR Vmax:        279.00 cm/s MV E velocity: 112.00 cm/s MV A velocity:  40.30 cm/s   SHUNTS MV E/A ratio:  2.78         Systemic VTI:  0.24 m                             Systemic Diam: 1.90 cm Oswaldo Milian MD Electronically signed by Oswaldo Milian MD Signature Date/Time: 08/27/2021/5:23:14 PM    Final    CARDIAC CATHETERIZATION  Result Date: 08/27/2021   Prox RCA lesion is 20% stenosed.   Mid LM to Dist LM lesion is 20% stenosed.   Ost LAD lesion is 30% stenosed.   Mid Cx lesion is 20% stenosed.   Prox LAD lesion is 50% stenosed.   The left ventricular systolic function is normal.   LV end diastolic pressure is normal.   The left ventricular ejection fraction is 55-65% by visual estimate.   There is no mitral valve regurgitation. Mild non-obstructive CAD Normal LV systolic  function Elevated troponin and chest pain is likely due to demand ischemic in the setting of rapid atrial fibrillation. Recommendation: Medical management of CAD.    Principal Problem:   Atrial fibrillation with RVR (HCC) Active Problems:   NSTEMI (non-ST elevated myocardial infarction) ([place])   End stage renal disease on dialysis [facility])   Renal cell carcinoma of left kidney (HCC)   IPMN (intraductal papillary mucinous neoplasm)   Diabetes mellitus type 2, controlled ([place])   Pulmonary nodule   Atrial fibrillation (HCC)   Elevated troponin   Chest pain of uncertain etiology     Laquanna Veazey Derek Jack, [facility]  If 7PM-7AM, please contact night-coverage [email or web address]   LOS: 2 days

## 2021-08-28 NOTE — Progress Notes (Signed)
[facility] Progress Note   Subjective:    Seen and examined on HD. No acute complaints and remains in SR. Recently informed by HD RN bleeding nearing end of HD today. She was able to meet her UFG 2.5L. BP remained stable. Plan to re-check CBC and monitor vascular access closely.  Objective Vitals:   08/28/21 1130 08/28/21 1150 08/28/21 1206 08/28/21 1225  BP: 117/60 121/72 106/60   Pulse: 63 63 63   Resp: (!) 23 (!) 21 20   Temp:   97.9 F (36.6 C)   TempSrc:   Oral   SpO2: (S) 100% 100% 100%   Weight:    56 kg  Height:       Physical Exam General: Well-appearing; NAD Heart: S1 and S2; RRR; No murmurs, gallops, or rubs Lungs: Clear anteriorly and laterally; No wheezing, rales, or rhonchi Abdomen: Soft and non-tender Extremities: No edema BLLE Dialysis Access: L AVF (+) B/T   Filed Weights   08/28/21 0328 08/28/21 0808 08/28/21 1225  Weight: 60.1 kg 49.7 kg 56 kg    Intake/Output Summary (Last 24 hours) at 08/28/2021 1229 Last data filed at 08/28/2021 1208 Gross per 24 hour  Intake 569.1 ml  Output 392.5 ml  Net 176.6 ml    Additional Objective Labs: Basic Metabolic Panel: Recent Labs  Lab 08/26/21 0447 08/27/21 0348 08/28/21 0457  NA 132* 132* 130*  K 4.0 3.8 4.4  CL 93* 92* 93*  CO2 '24 22 22  '$ GLUCOSE 166* 159* 137*  BUN 34* 50* 63*  CREATININE 3.87* 4.98* 5.83*  CALCIUM 8.4* 8.6* 8.3*   Liver Function Tests: Recent Labs  Lab 08/25/21 1917  AST 21  ALT 23  ALKPHOS 87  BILITOT 0.8  PROT 6.6  ALBUMIN 2.9*   No results for input(s): "LIPASE", "AMYLASE" in the last 168 hours. CBC: Recent Labs  Lab 08/22/21 0740 08/25/21 1917 08/26/21 0447 08/27/21 0348 08/28/21 0457  WBC 7.1 13.1* 9.6 8.9 7.8  NEUTROABS 5.1 11.3*  --   --   --   HGB 11.9* 10.7* 8.9* 9.2* 8.7*  HCT 38.5 33.2* 28.4* 29.5* 27.3*  MCV 92.8 90.7 90.7 92.8 90.1  PLT 227 219 207 246 282   Blood Culture    Component Value Date/Time   SDES URINE, RANDOM  06/18/2013 1633   SPECREQUEST NONE 06/18/2013 1633   CULT  06/18/2013 1633    INSIGNIFICANT GROWTH Performed at [place] 06/19/2013 FINAL 06/18/2013 1633    Cardiac Enzymes: No results for input(s): "CKTOTAL", "CKMB", "CKMBINDEX", "TROPONINI" in the last 168 hours. CBG: Recent Labs  Lab 08/27/21 1544 08/27/21 1932 08/27/21 1958 08/27/21 2112 08/28/21 0625  GLUCAP 220* 55* 88 127* 137*   Iron Studies: No results for input(s): "IRON", "TIBC", "TRANSFERRIN", "FERRITIN" in the last 72 hours. Lab Results  Component Value Date   INR 0.9 08/22/2021   INR 1.11 11/12/2016   INR 1.15 04/24/2016   Studies/Results: ECHOCARDIOGRAM COMPLETE  Result Date: 08/27/2021    ECHOCARDIOGRAM REPORT   Patient Name:   Cathie P Zhan Date of Exam: 08/27/2021 Medical Rec #:  [id number]        Height:       62.0 in Accession #:    [id number]       Weight:       132.0 lb Date of Birth:  11-06-1935        BSA:          1.602 m Patient Age:  86 years         BP:           106/58 mmHg Patient Gender: F                HR:           78 bpm. Exam Location:  Inpatient Procedure: 2D Echo, Cardiac Doppler and Color Doppler Indications:    NSTEMI I21.4  History:        Patient has prior history of Echocardiogram examinations, most                 recent 08/08/2021. Stroke, Arrythmias:Bradycardia; Risk                 Factors:Diabetes, GERD, Hypertension and Sleep Apnea.  Sonographer:    Bernadene Person RDCS Referring Phys: [id number] [place]  1. Right ventricular systolic function is moderately reduced. The right ventricular size is mildly enlarged. There is moderately elevated pulmonary artery systolic pressure. The estimated right ventricular systolic pressure is 47.8 mmHg. Normal apical function with free wall hypokinesis, consistent with McConnell's sign as can be seen in acute PE (image 58). Consider evaluation for PE.  2. Left ventricular ejection fraction, by estimation, is  60 to 65%. The left ventricle has normal function. The left ventricle has no regional wall motion abnormalities. Left ventricular diastolic parameters are consistent with Grade III diastolic dysfunction (restrictive). Elevated left atrial pressure.  3. Left atrial size was severely dilated.  4. Right atrial size was severely dilated.  5. The mitral valve is degenerative. Mild mitral valve regurgitation. No evidence of mitral stenosis. Moderate mitral annular calcification.  6. Tricuspid valve regurgitation is mild to moderate.  7. The aortic valve is tricuspid. Aortic valve regurgitation is not visualized. Aortic valve sclerosis is present, with no evidence of aortic valve stenosis.  8. The inferior vena cava is dilated in size with <50% respiratory variability, suggesting right atrial pressure of 15 mmHg. FINDINGS  Left Ventricle: Left ventricular ejection fraction, by estimation, is 60 to 65%. The left ventricle has normal function. The left ventricle has no regional wall motion abnormalities. The left ventricular internal cavity size was normal in size. There is  no left ventricular hypertrophy. Left ventricular diastolic parameters are consistent with Grade III diastolic dysfunction (restrictive). Elevated left atrial pressure. Right Ventricle: The right ventricular size is mildly enlarged. No increase in right ventricular wall thickness. Right ventricular systolic function is moderately reduced. There is moderately elevated pulmonary artery systolic pressure. The tricuspid regurgitant velocity is 2.79 m/s, and with an assumed right atrial pressure of 15 mmHg, the estimated right ventricular systolic pressure is 29.5 mmHg. Left Atrium: Left atrial size was severely dilated. Right Atrium: Right atrial size was severely dilated. Pericardium: There is no evidence of pericardial effusion. Mitral Valve: The mitral valve is degenerative in appearance. Moderate mitral annular calcification. Mild mitral valve  regurgitation. No evidence of mitral valve stenosis. Tricuspid Valve: The tricuspid valve is normal in structure. Tricuspid valve regurgitation is mild to moderate. Aortic Valve: The aortic valve is tricuspid. Aortic valve regurgitation is not visualized. Aortic valve sclerosis is present, with no evidence of aortic valve stenosis. Pulmonic Valve: The pulmonic valve was not well visualized. Pulmonic valve regurgitation is not visualized. Aorta: The aortic root and ascending aorta are structurally normal, with no evidence of dilitation. Venous: The inferior vena cava is dilated in size with less than 50% respiratory variability, suggesting right atrial pressure of 15 mmHg. IAS/Shunts:  The interatrial septum was not well visualized.  LEFT VENTRICLE PLAX 2D LVIDd:         4.20 cm     Diastology LVIDs:         2.60 cm     LV e' medial:    6.46 cm/s LV PW:         0.80 cm     LV E/e' medial:  17.3 LV IVS:        0.70 cm     LV e' lateral:   8.25 cm/s LVOT diam:     1.90 cm     LV E/e' lateral: 13.6 LV SV:         67 LV SV Index:   42 LVOT Area:     2.84 cm  LV Volumes (MOD) LV vol d, MOD A2C: 52.2 ml LV vol d, MOD A4C: 58.5 ml LV vol s, MOD A2C: 24.0 ml LV vol s, MOD A4C: 19.1 ml LV SV MOD A2C:     28.2 ml LV SV MOD A4C:     58.5 ml LV SV MOD BP:      33.7 ml RIGHT VENTRICLE RV S prime:     9.76 cm/s TAPSE (M-mode): 1.0 cm LEFT ATRIUM             Index        RIGHT ATRIUM           Index LA diam:        4.50 cm 2.81 cm/m   RA Area:     26.80 cm LA Vol (A2C):   87.2 ml 54.42 ml/m  RA Volume:   98.10 ml  61.23 ml/m LA Vol (A4C):   94.0 ml 58.67 ml/m LA Biplane Vol: 91.0 ml 56.80 ml/m  AORTIC VALVE LVOT Vmax:   119.00 cm/s LVOT Vmean:  80.000 cm/s LVOT VTI:    0.235 m  AORTA Ao Root diam: 3.20 cm Ao Asc diam:  3.50 cm MITRAL VALVE                TRICUSPID VALVE MV Area (PHT): 3.91 cm     TR Peak grad:   31.1 mmHg MV Decel Time: 194 msec     TR Vmax:        279.00 cm/s MV E velocity: 112.00 cm/s MV A velocity: 40.30  cm/s   SHUNTS MV E/A ratio:  2.78         Systemic VTI:  0.24 m                             Systemic Diam: 1.90 cm Oswaldo Milian MD Electronically signed by Oswaldo Milian MD Signature Date/Time: 08/27/2021/5:23:14 PM    Final    CARDIAC CATHETERIZATION  Result Date: 08/27/2021   Prox RCA lesion is 20% stenosed.   Mid LM to Dist LM lesion is 20% stenosed.   Ost LAD lesion is 30% stenosed.   Mid Cx lesion is 20% stenosed.   Prox LAD lesion is 50% stenosed.   The left ventricular systolic function is normal.   LV end diastolic pressure is normal.   The left ventricular ejection fraction is 55-65% by visual estimate.   There is no mitral valve regurgitation. Mild non-obstructive CAD Normal LV systolic function Elevated troponin and chest pain is likely due to demand ischemic in the setting of rapid atrial fibrillation. Recommendation: Medical management of CAD.    Medications:  sodium chloride Stopped (08/27/21 0654)   sodium chloride      apixaban  2.5 mg Oral BID   Chlorhexidine Gluconate Cloth  6 each Topical Q0600   darbepoetin (ARANESP) injection - DIALYSIS  25 mcg Intravenous Q Tue-HD   escitalopram  20 mg Oral QHS   insulin aspart  0-9 Units Subcutaneous TID WC   insulin aspart  4 Units Subcutaneous TID WC   lipase/protease/amylase  36,000 Units Oral With snacks   lipase/protease/amylase  72,000 Units Oral TID WC   metoprolol succinate  12.5 mg Oral QHS   midodrine  10 mg Oral Q M,W,F-HD   multivitamin  1 tablet Oral QHS   polyethylene glycol  17 g Oral Daily   sevelamer carbonate  1,600 mg Oral TID WC   sodium chloride flush  3 mL Intravenous Q12H    Dialysis Orders: MWF- [facility] 3hrs42mn, BFR 350, DFR Auto 1.5,  EDW 57kg, 2K/ 2Ca Access: L AVF  Heparin 2000 units bolus with HD Mircera 75 mcg q4wks - new order has not been started yet Calcitriol 170m PO qHD- last 08/25/21  Assessment/Plan: NSTEMI-Cardiology following, s/p LHC today which showed  non-obstructive CAD, intolerant to statin, starting Eliquis tonight BID Afib with RVR-Continue Metoprolol and Eliquis ESRD - on HD MWF, on HD, informed of bleeding from AV access near end of treatment, blood around 100-200cc. Re-checking another CBC and will monitor access closely. Will consult VVS in outpatient if bleeding persist/worsens during treatment. Hypertension/volume- BP relatively stable, euvolemic on exam Anemia of CKD -Hgb now 8.7, if patient remains inpatient, ESA and Fe given with HD today, will resume in outpatient. Secondary Hyperparathyroidism - Will check PO4 in AM, Ca ok. Nutrition - Renal diet with fluid restriction Dispo-okay for discharge after HD tomorrow morning   CoTobie PoetNP [place]/12/2021,12:29 PM  LOS: 2 days

## 2021-08-28 NOTE — Progress Notes (Addendum)
Renal N.P. made aware of blood loss at the arterial site. Order done and carried out. Receiving nurse made aware.

## 2021-08-28 NOTE — Progress Notes (Signed)
PT Cancellation Note  Patient Details Name: Kim Burns MRN: [id number] DOB: 11-22-1935   Cancelled Treatment:    Reason Eval/Treat Not Completed: Patient at procedure or test/unavailable;Other (comment). Pt was in HD, then was in radiology, currently pt feels lightheaded and thinks her blood sugar is dropping (per NT is 96). Will re-attempt tomorrow.   [place] 08/28/2021, 4:32 PM [place] Office [phone]

## 2021-08-28 NOTE — Inpatient Diabetes Management (Signed)
Inpatient Diabetes Program Recommendations  AACE/ADA: New Consensus Statement on Inpatient Glycemic Control (2015)  Target Ranges:  Prepandial:   less than 140 mg/dL      Peak postprandial:   less than 180 mg/dL (1-2 hours)      Critically ill patients:  140 - 180 mg/dL   Lab Results  Component Value Date   GLUCAP 137 (H) 08/28/2021   HGBA1C 5.6 08/22/2021    Review of Glycemic Control  Latest Reference Range & Units 08/27/21 12:10 08/27/21 15:44 08/27/21 19:32 08/27/21 19:58 08/27/21 21:12 08/28/21 06:25  Glucose-Capillary 70 - 99 mg/dL 155 (H) 220 (H) 55 (L) 88 127 (H) 137 (H)  (H): Data is abnormally high (L): Data is abnormally low  Diabetes history: DM Outpatient Diabetes medications: Novolog 10 units tid Current orders for Inpatient glycemic control: Novolog 4 units tid, Novolog correction 0-9 units tid  Inpatient Diabetes Program Recommendations:   Please consider: -Decrease Novolog correction to 0-6 units tid -Decrease Novolog meal coverage to 3 units tid if eats 50% meals  Thank you, Nani Gasser. Jovaun Levene, RN, MSN, CDE  Diabetes Coordinator Inpatient Glycemic Control Team Team Pager [phone] (8am-5pm) 08/28/2021 1:20 PM

## 2021-08-28 NOTE — Progress Notes (Signed)
PT Cancellation Note  Patient Details Name: Kim Burns MRN: [id number] DOB: 04-10-1935   Cancelled Treatment:    Reason Eval/Treat Not Completed: Patient at procedure or test/unavailable (Pt in HD.  Will return as able.)   [name] 08/28/2021, 8:45 AM [name],PT Acute Rehab Services [phone]

## 2021-08-29 DIAGNOSIS — I4891 Unspecified atrial fibrillation: Secondary | ICD-10-CM | POA: Diagnosis not present

## 2021-08-29 DIAGNOSIS — N186 End stage renal disease: Secondary | ICD-10-CM

## 2021-08-29 DIAGNOSIS — T82838A Hemorrhage of vascular prosthetic devices, implants and grafts, initial encounter: Secondary | ICD-10-CM

## 2021-08-29 DIAGNOSIS — D490 Neoplasm of unspecified behavior of digestive system: Secondary | ICD-10-CM | POA: Diagnosis not present

## 2021-08-29 DIAGNOSIS — I6340 Cerebral infarction due to embolism of unspecified cerebral artery: Secondary | ICD-10-CM

## 2021-08-29 DIAGNOSIS — Z992 Dependence on renal dialysis: Secondary | ICD-10-CM

## 2021-08-29 DIAGNOSIS — E1122 Type 2 diabetes mellitus with diabetic chronic kidney disease: Secondary | ICD-10-CM | POA: Diagnosis not present

## 2021-08-29 LAB — CBC
HCT: 24.9 % — ABNORMAL LOW (ref 36.0–46.0)
Hemoglobin: 7.9 g/dL — ABNORMAL LOW (ref 12.0–15.0)
MCH: 28.7 pg (ref 26.0–34.0)
MCHC: 31.7 g/dL (ref 30.0–36.0)
MCV: 90.5 fL (ref 80.0–100.0)
Platelets: 289 10*3/uL (ref 150–400)
RBC: 2.75 MIL/uL — ABNORMAL LOW (ref 3.87–5.11)
RDW: 15.7 % — ABNORMAL HIGH (ref 11.5–15.5)
WBC: 6.6 10*3/uL (ref 4.0–10.5)
nRBC: 0 % (ref 0.0–0.2)

## 2021-08-29 LAB — BASIC METABOLIC PANEL
Anion gap: 14 (ref 5–15)
BUN: 34 mg/dL — ABNORMAL HIGH (ref 8–23)
CO2: 27 mmol/L (ref 22–32)
Calcium: 8.5 mg/dL — ABNORMAL LOW (ref 8.9–10.3)
Chloride: 92 mmol/L — ABNORMAL LOW (ref 98–111)
Creatinine, Ser: 3.95 mg/dL — ABNORMAL HIGH (ref 0.44–1.00)
GFR, Estimated: 11 mL/min — ABNORMAL LOW (ref >60)
Glucose, Bld: 151 mg/dL — ABNORMAL HIGH (ref 70–99)
Potassium: 3.5 mmol/L (ref 3.5–5.1)
Sodium: 133 mmol/L — ABNORMAL LOW (ref 135–145)

## 2021-08-29 LAB — GLUCOSE, CAPILLARY
Glucose-Capillary: 148 mg/dL — ABNORMAL HIGH (ref 70–99)
Glucose-Capillary: 196 mg/dL — ABNORMAL HIGH (ref 70–99)
Glucose-Capillary: 74 mg/dL (ref 70–99)
Glucose-Capillary: 189 mg/dL — ABNORMAL HIGH (ref 70–99)

## 2021-08-29 LAB — HEMOGLOBIN A1C
Hgb A1c MFr Bld: 5.8 % — ABNORMAL HIGH (ref 4.8–5.6)
Mean Plasma Glucose: 119.76 mg/dL

## 2021-08-29 LAB — HEPATITIS B SURFACE ANTIBODY, QUANTITATIVE: Hep B S AB Quant (Post): <3.1 m[IU]/mL — ABNORMAL LOW (ref >9.9)

## 2021-08-29 MED ORDER — PENTAFLUOROPROP-TETRAFLUOROETH EX AERO
1.0000 | INHALATION_SPRAY | CUTANEOUS | Status: DC | PRN
Start: 2021-08-29 — End: 2021-08-31

## 2021-08-29 MED ORDER — DARBEPOETIN ALFA 60 MCG/0.3ML IJ SOSY: 60.0000 ug | PREFILLED_SYRINGE | INTRAMUSCULAR | Status: DC

## 2021-08-29 MED ORDER — LIDOCAINE-PRILOCAINE 2.5-2.5 % EX CREA: 1.0000 | TOPICAL_CREAM | CUTANEOUS | Status: DC | PRN

## 2021-08-29 MED ORDER — HEPARIN SODIUM (PORCINE) 1000 UNIT/ML DIALYSIS: 1000.0000 [IU] | INTRAMUSCULAR | Status: DC | PRN

## 2021-08-29 MED ORDER — LIDOCAINE HCL (PF) 1 % IJ SOLN: 5.0000 mL | INTRAMUSCULAR | Status: DC | PRN

## 2021-08-29 MED ORDER — ALTEPLASE 2 MG IJ SOLR: 2.0000 mg | Freq: Once | INTRAMUSCULAR | Status: DC | PRN

## 2021-08-29 MED ORDER — BISACODYL 10 MG RE SUPP
10.0000 mg | Freq: Once | RECTAL | Status: AC
Start: 2021-08-29 — End: 2021-08-29
  Administered 2021-08-29: 10 mg via RECTAL
  Filled 2021-08-29: qty 1

## 2021-08-29 MED ORDER — SODIUM CHLORIDE 0.9 % IV SOLN
125.0000 mg | INTRAVENOUS | Status: DC
  Administered 2021-08-31: 125 mg via INTRAVENOUS
  Filled 2021-08-29 (×2): qty 10

## 2021-08-29 NOTE — Progress Notes (Signed)
Pt receives out-pt HD at [facility] on MWF. Pt arrives at 6:20 for 6:40 chair time. Will assist as needed.   Melven Sartorius Renal Navigator [phone]

## 2021-08-29 NOTE — Progress Notes (Addendum)
STROKE TEAM PROGRESS NOTE   INTERVAL HISTORY No family is at the bedside. She is laying in bed in  NAD. She states her blurry vision has been an ongoing problem since starting HD a few months back. She also c/o headache that she has had since yesterday after HD. This also is not new, she states she has difficulty with her BP control (up and down) due to HD and she has been on a few different medications.   Vitals:   08/28/21 2120 08/29/21 0131 08/29/21 0505 08/29/21 0728  BP: (!) 110/55 (!) 108/59 (!) 100/52 106/63  Pulse: 72 70 71 66  Resp:  18 15 (!) 22  Temp:  99.1 F (37.3 C) 98.5 F (36.9 C) 98.5 F (36.9 C)  TempSrc:  Oral Oral Oral  SpO2:  96% 95% 96%  Weight:  58.1 kg    Height:       CBC:  Recent Labs  Lab 08/25/21 1917 08/26/21 0447 08/28/21 1610 08/29/21 0209  WBC 13.1*   < > 8.3 6.6  NEUTROABS 11.3*  --   --   --   HGB 10.7*   < > 8.3* 7.9*  HCT 33.2*   < > 26.3* 24.9*  MCV 90.7   < > 88.6 90.5  PLT 219   < > 292 289   < > = values in this interval not displayed.   Basic Metabolic Panel:  Recent Labs  Lab 08/28/21 0457 08/29/21 0209  NA 130* 133*  K 4.4 3.5  CL 93* 92*  CO2 22 27  GLUCOSE 137* 151*  BUN 63* 34*  CREATININE 5.83* 3.95*  CALCIUM 8.3* 8.5*   Lipid Panel:  Recent Labs  Lab 08/26/21 0447  CHOL 95  TRIG 110  HDL 35*  CHOLHDL 2.7  VLDL 22  LDLCALC 38   HgbA1c:  Recent Labs  Lab 08/29/21 0209  HGBA1C 5.8*   Urine Drug Screen: No results for input(s): "LABOPIA", "COCAINSCRNUR", "LABBENZ", "AMPHETMU", "THCU", "LABBARB" in the last 168 hours.  Alcohol Level No results for input(s): "ETH" in the last 168 hours.  IMAGING past 24 hours CT ANGIO HEAD NECK W WO CM  Result Date: 08/29/2021 CLINICAL DATA:  Follow-up examination for stroke. EXAM: CT ANGIOGRAPHY HEAD AND NECK TECHNIQUE: Multidetector CT imaging of the head and neck was performed using the standard protocol during bolus administration of intravenous contrast. Multiplanar  CT image reconstructions and MIPs were obtained to evaluate the vascular anatomy. Carotid stenosis measurements (when applicable) are obtained utilizing NASCET criteria, using the distal internal carotid diameter as the denominator. RADIATION DOSE REDUCTION: This exam was performed according to the departmental dose-optimization program which includes automated exposure control, adjustment of the mA and/or kV according to patient size and/or use of iterative reconstruction technique. CONTRAST:  76m OMNIPAQUE IOHEXOL 350 MG/ML SOLN COMPARISON:  MRI from earlier the same day. FINDINGS: CT HEAD FINDINGS Brain: Generalized age-related cerebral atrophy with mild chronic small vessel ischemic disease. Previously identified punctate ischemic changes not visible by CT. No other acute large vessel territory infarct. No acute intracranial hemorrhage. No mass lesion, mass effect, or midline shift. No hydrocephalus or extra-axial fluid collection. Vascular: No abnormal hyperdense vessel. Calcified atherosclerosis present about the skull base. Skull: Scalp soft tissues and calvarium within normal limits. Sinuses: Scattered mucoperiosteal thickening present about the ethmoidal air cells. Paranasal sinuses are otherwise clear. No significant mastoid effusion. Orbits: Globes orbital soft tissues demonstrate no acute finding. Review of the MIP images confirms the above  findings CTA NECK FINDINGS Aortic arch: Visualized aortic arch normal caliber with standard 3 vessel morphology. Mild-to-moderate atheromatous change about the arch itself. No significant stenosis about the origin the great vessels. Right carotid system: Right common and internal carotid arteries are patent without dissection or other acute finding. Eccentric calcified plaque about the right carotid bulb without hemodynamically significant stenosis. Left carotid system: Left common and internal carotid arteries are patent without dissection or other acute finding.  Mild calcified plaque about the left carotid bulb without hemodynamically significant stenosis. Vertebral arteries: Both vertebral arteries arise from subclavian arteries. No proximal subclavian artery stenosis. Right vertebral artery dominant. Vertebral arteries patent without stenosis or dissection. Skeleton: No discrete or worrisome osseous lesions. Moderate cervical spondylosis at C3-4 through C6-7. Other neck: No other acute soft tissue abnormality within the neck. Upper chest: 3 mm nodule within the superior segment of the left lower lobe noted, stable. Scattered atelectatic changes noted elsewhere within the visualized lungs. Review of the MIP images confirms the above findings CTA HEAD FINDINGS Anterior circulation: Both internal carotid arteries patent to the termini without stenosis. A1 segments patent bilaterally. Normal anterior communicating artery complex. Anterior cerebral arteries patent without stenosis. No M1 stenosis or occlusion. Normal MCA bifurcations. Distal MCA branches perfused and symmetric. Posterior circulation: Both vertebral arteries patent without stenosis. Right vertebral artery dominant. Neither PICA well visualized. Basilar patent to its distal aspect without stenosis. Superior cerebral arteries patent bilaterally. Both PCAs well perfused and patent to their distal aspects. Venous sinuses: Patent allowing for timing the contrast bolus. Anatomic variants: As above.  No aneurysm. Review of the MIP images confirms the above findings IMPRESSION: 1. Negative CTA of the head and neck for large vessel occlusion or other emergent finding. 2. Mild for age atheromatous disease about the aortic arch and carotid bifurcations without hemodynamically significant stenosis. 3. 3 mm left lower lobe nodule, indeterminate. No routine follow-up imaging is recommended per Fleischner Society Guidelines. These guidelines do not apply to immunocompromised patients and patients with cancer. Follow up in  patients with significant comorbidities as clinically warranted. For lung cancer screening, adhere to Lung-RADS guidelines. Reference: Radiology. 2017; 284(1):228-43. Electronically Signed   By: Jeannine Boga M.D.   On: 08/29/2021 03:02   MR BRAIN WO CONTRAST  Result Date: 08/28/2021 CLINICAL DATA:  Binocular visual field changes.  Vision loss. EXAM: MRI HEAD WITHOUT CONTRAST TECHNIQUE: Multiplanar, multiecho pulse sequences of the brain and surrounding structures were obtained without intravenous contrast. COMPARISON:  MR head without contrast 03/24/2018 FINDINGS: Brain: Scattered punctate foci of restricted diffusion are noted. A focal lesion is noted along the postcentral gyrus on image 88 of series 5 subcortical punctate focus of restricted diffusion is present in the left parietal lobe on image 83 of series 5 with minimal associated T2 hyperintensity punctate focus is present in the right temporoparietal lobe on image 75 of series 5. A punctate focus is present in the right superior cerebellum on image 62 of series 5. The remote foci of susceptibility most notably along the right splenium of the corpus callosum are stable. Additional lesions are present in the right cerebellum and left frontal lobe. A The internal auditory canals are within normal limits. Other remote lacunar infarcts are present in the cerebellum bilaterally. Brainstem is within normal limits. The ventricles are of normal size. No significant extraaxial fluid collection is present. Vascular: Flow is present in the major intracranial arteries. Skull and upper cervical spine: Degenerative changes of the upper cervical spine have  progressed. Craniocervical junction is normal. Marrow signal is normal. Sinuses/Orbits: The paranasal sinuses and mastoid air cells are clear. Bilateral lens replacements are noted. Globes and orbits are otherwise unremarkable. IMPRESSION: 1. Scattered punctate foci of restricted diffusion as described. The  finding is nonspecific but can be seen in the setting of acute/subacute ischemia, a demyelinating process such as multiple sclerosis, vasculitis, complicated migraine headaches, or as the sequelae of a prior infectious or inflammatory process. 2. Other remote lacunar infarcts of the cerebellum bilaterally. 3. Progressive degenerative changes of the upper cervical spine. Electronically Signed   By: San Morelle M.D.   On: 08/28/2021 15:20    PHYSICAL EXAM NEURO:  Mental Status: AA&Ox3  Language: speech is clear.  Naming, repetition, fluency, and comprehension intact. Cranial Nerves: PERRL, EOMI, she does note wavy vision, no visual field deficit, no facial asymmetry, facial sensation intact, hearing intact, tongue/uvula/soft palate midline, normal sternocleidomastoid and trapezius muscle strength. No evidence of tongue atrophy or fibrillations Motor:  5/5 strength noted in all extremities LLE range of motion limited by pain Tone: is normal and bulk is normal Sensation- Intact to light touch bilaterally Coordination: FTN intact bilaterally, no ataxia in BLE. Gait- deferred  ASSESSMENT/PLAN [name] is a 86 y.o. female with history of  ESRD on HD MWF, bradycardia, DM2 with CGM, IPMN s/p Whipple procedure in 2018, renal cell carcinoma s/p ablation and embolization procedure on 7/5 who initially presented to the ED with chest pain and palpitations on 7/8. On arrival to ED she was in afib with RVR with a rate of 180 and a troponin of 1098. She subsequently converted to sinus bradycardia while on a cardizem drip. She had a cardiac cath done on 7/10 and reported vision changes on 7/11  Stroke, incidental finding:  Scattered punctate infarcts likely cardioembolic, could be new onset atrial fibrillation vs. Cardiac cath procedure related. Etiology:  cardioembolic  MRI Scattered punctate foci of restricted diffusion. Other remote lacunar infarcts of the cerebellum bilaterally. CTA head  & neck Negative CTA of the head and neck for large vessel occlusion or other emergent finding. CT chest no PE 2D Echo EF 60-65% LDL 38 HgbA1c 5.8 VTE prophylaxis -Eliquis 2.5 No antithrombotic prior to admission, now on  Eliquis 2.5 BID .  Therapy recommendations:  pending Disposition:  pending  Blurry vision Per pt report, she has had blurry vision since 02/2021 starting HD. With low BP in HD, she will have HA and then wavy lines and blurry vision. Not new symptoms this admission Could be related to hypotension or complicated migraine Avoid low BP Recommend TED hose.  A fib with RVR ( new diagnosis) Presenting on admission Converted to SR, briefly on cardizem gtt Now on metoprolol for rate control On Eliquis 2.5 twice daily Cardiac cath nonobstructive CAD  Hypertension Hypotension Home meds:  none Stable on the low end Now on metoprolol and midodrine Long-term BP goal normotensive Recommend TED hose.  Lipid management Home meds:  none LDL 38, goal < 70 No statin needed given advanced age and LDL at goal.  Diabetes type II Controlled Home meds:  insulin HgbA1c 5.8, goal < 7.0 CBGs SSI PCP follow-up as outpatient  Other Stroke Risk Factors Advanced Age >/= 34  Hx of stroke/ TIA Obstructive sleep apnea, not on CPAP at home  Other Active Problems ESRD on HD GERD  Hospital day # [place] DNP, [facility]  ATTENDING NOTE: I reviewed above note and agree with the assessment  and plan. Pt was seen and examined.   86 year old female with history of ESRD on hemodialysis, bradycardia, diabetes, renal cell carcinoma status post embolization, IPMD status post Whipple surgery 12 years ago, TIA 1996 on Plavix admitted for chest pain with palpitation on 7/8, found to have A-fib RVR, treated with Cardizem IV and reverted back to normal sinus rhythm but found to have hypotension, put on midodrine but also metoprolol for rate control.  Left heart cath nonobstructive CAD.   Patient reported blurry vision which prompted for MRI which showed bilateral cerebral punctate embolic infarcts.  Currently on Eliquis 2.5 twice daily.  CT head and neck negative.  EF 60 to 65%.  CT chest no PE.  LDL 38, A1c 5.8.  Creatinine 3.95, hemoglobin 8.3-> 7.9.  BP still soft.  On exam, patient awake alert, orientated x3, no neurologic deficit, no blurry vision reported.  Etiology for patient incidental stroke on MRI likely due to either from A-fib RVR new diagnosis or cardiac cath procedure related.  Continue Eliquis 2.5 twice daily for stroke prevention.  However, patient blurry vision is not new symptoms, she has had since January starting hemodialysis.  Likely related to hypertension for complicated headache/migraine.  Recommend TED hose and treat for hypotension.  For detailed assessment and plan, please refer to above/below as I have made changes wherever appropriate.   Neurology will sign off. Please call with questions. Pt will follow up with stroke clinic NP at [facility] in about 4 weeks. Thanks for the consult.   Rosalin Hawking, MD PhD Stroke Neurology 08/29/2021 6:40 PM    To contact Stroke Continuity provider, please refer to [email or web address] After hours, contact General Neurology

## 2021-08-29 NOTE — Progress Notes (Signed)
[facility] Progress Note   Subjective:    Seen and examined patient at bedside. Tolerated yesterday's HD but was informed of bleeding from L AVF near end of treatment. Per patient, this has been an ongoing issue at her outpatient HD center. She denies bleeding from L AVF overnight. Denies SOB, CP, and N/V. Patient recently c/o acute vision changes. Neuro now following for CVA w/u.  Objective Vitals:   08/29/21 0131 08/29/21 0505 08/29/21 0728 08/29/21 1200  BP: (!) 108/59 (!) 100/52 106/63 (!) 110/58  Pulse: 70 71 66 69  Resp: 18 15 (!) 22 19  Temp: 99.1 F (37.3 C) 98.5 F (36.9 C) 98.5 F (36.9 C) 98.2 F (36.8 C)  TempSrc: Oral Oral Oral Oral  SpO2: 96% 95% 96% 96%  Weight: 58.1 kg     Height:       Physical Exam General: Well-appearing; NAD Heart: S1 and S2; RRR; No murmurs, gallops, or rubs Lungs: Clear anteriorly and laterally; No wheezing, rales, or rhonchi Abdomen: Soft and non-tender Extremities: No edema BLLE Dialysis Access: L AVF (+) B/T    Filed Weights   08/28/21 0808 08/28/21 1225 08/29/21 0131  Weight: 49.7 kg 56 kg 58.1 kg    Intake/Output Summary (Last 24 hours) at 08/29/2021 1311 Last data filed at 08/29/2021 1246 Gross per 24 hour  Intake 580 ml  Output 400 ml  Net 180 ml    Additional Objective Labs: Basic Metabolic Panel: Recent Labs  Lab 08/27/21 0348 08/28/21 0457 08/29/21 0209  NA 132* 130* 133*  K 3.8 4.4 3.5  CL 92* 93* 92*  CO2 '22 22 27  '$ GLUCOSE 159* 137* 151*  BUN 50* 63* 34*  CREATININE 4.98* 5.83* 3.95*  CALCIUM 8.6* 8.3* 8.5*   Liver Function Tests: Recent Labs  Lab 08/25/21 1917  AST 21  ALT 23  ALKPHOS 87  BILITOT 0.8  PROT 6.6  ALBUMIN 2.9*   No results for input(s): "LIPASE", "AMYLASE" in the last 168 hours. CBC: Recent Labs  Lab 08/25/21 1917 08/26/21 0447 08/27/21 0348 08/28/21 0457 08/28/21 1610 08/29/21 0209  WBC 13.1* 9.6 8.9 7.8 8.3 6.6  NEUTROABS 11.3*  --   --   --   --   --    HGB 10.7* 8.9* 9.2* 8.7* 8.3* 7.9*  HCT 33.2* 28.4* 29.5* 27.3* 26.3* 24.9*  MCV 90.7 90.7 92.8 90.1 88.6 90.5  PLT 219 207 246 282 292 289   Blood Culture    Component Value Date/Time   SDES URINE, RANDOM 06/18/2013 1633   SPECREQUEST NONE 06/18/2013 1633   CULT  06/18/2013 1633    INSIGNIFICANT GROWTH Performed at [facility] 06/19/2013 FINAL 06/18/2013 1633    Cardiac Enzymes: No results for input(s): "CKTOTAL", "CKMB", "CKMBINDEX", "TROPONINI" in the last 168 hours. CBG: Recent Labs  Lab 08/28/21 1319 08/28/21 1619 08/28/21 2126 08/29/21 0630 08/29/21 1123  GLUCAP 143* 96 224* 148* 196*   Iron Studies: No results for input(s): "IRON", "TIBC", "TRANSFERRIN", "FERRITIN" in the last 72 hours. Lab Results  Component Value Date   INR 0.9 08/22/2021   INR 1.11 11/12/2016   INR 1.15 04/24/2016   Studies/Results: CT ANGIO HEAD NECK W WO CM  Result Date: 08/29/2021 CLINICAL DATA:  Follow-up examination for stroke. EXAM: CT ANGIOGRAPHY HEAD AND NECK TECHNIQUE: Multidetector CT imaging of the head and neck was performed using the standard protocol during bolus administration of intravenous contrast. Multiplanar CT image reconstructions and MIPs were obtained to  evaluate the vascular anatomy. Carotid stenosis measurements (when applicable) are obtained utilizing NASCET criteria, using the distal internal carotid diameter as the denominator. RADIATION DOSE REDUCTION: This exam was performed according to the departmental dose-optimization program which includes automated exposure control, adjustment of the mA and/or kV according to patient size and/or use of iterative reconstruction technique. CONTRAST:  6m OMNIPAQUE IOHEXOL 350 MG/ML SOLN COMPARISON:  MRI from earlier the same day. FINDINGS: CT HEAD FINDINGS Brain: Generalized age-related cerebral atrophy with mild chronic small vessel ischemic disease. Previously identified punctate ischemic changes not visible  by CT. No other acute large vessel territory infarct. No acute intracranial hemorrhage. No mass lesion, mass effect, or midline shift. No hydrocephalus or extra-axial fluid collection. Vascular: No abnormal hyperdense vessel. Calcified atherosclerosis present about the skull base. Skull: Scalp soft tissues and calvarium within normal limits. Sinuses: Scattered mucoperiosteal thickening present about the ethmoidal air cells. Paranasal sinuses are otherwise clear. No significant mastoid effusion. Orbits: Globes orbital soft tissues demonstrate no acute finding. Review of the MIP images confirms the above findings CTA NECK FINDINGS Aortic arch: Visualized aortic arch normal caliber with standard 3 vessel morphology. Mild-to-moderate atheromatous change about the arch itself. No significant stenosis about the origin the great vessels. Right carotid system: Right common and internal carotid arteries are patent without dissection or other acute finding. Eccentric calcified plaque about the right carotid bulb without hemodynamically significant stenosis. Left carotid system: Left common and internal carotid arteries are patent without dissection or other acute finding. Mild calcified plaque about the left carotid bulb without hemodynamically significant stenosis. Vertebral arteries: Both vertebral arteries arise from subclavian arteries. No proximal subclavian artery stenosis. Right vertebral artery dominant. Vertebral arteries patent without stenosis or dissection. Skeleton: No discrete or worrisome osseous lesions. Moderate cervical spondylosis at C3-4 through C6-7. Other neck: No other acute soft tissue abnormality within the neck. Upper chest: 3 mm nodule within the superior segment of the left lower lobe noted, stable. Scattered atelectatic changes noted elsewhere within the visualized lungs. Review of the MIP images confirms the above findings CTA HEAD FINDINGS Anterior circulation: Both internal carotid arteries  patent to the termini without stenosis. A1 segments patent bilaterally. Normal anterior communicating artery complex. Anterior cerebral arteries patent without stenosis. No M1 stenosis or occlusion. Normal MCA bifurcations. Distal MCA branches perfused and symmetric. Posterior circulation: Both vertebral arteries patent without stenosis. Right vertebral artery dominant. Neither PICA well visualized. Basilar patent to its distal aspect without stenosis. Superior cerebral arteries patent bilaterally. Both PCAs well perfused and patent to their distal aspects. Venous sinuses: Patent allowing for timing the contrast bolus. Anatomic variants: As above.  No aneurysm. Review of the MIP images confirms the above findings IMPRESSION: 1. Negative CTA of the head and neck for large vessel occlusion or other emergent finding. 2. Mild for age atheromatous disease about the aortic arch and carotid bifurcations without hemodynamically significant stenosis. 3. 3 mm left lower lobe nodule, indeterminate. No routine follow-up imaging is recommended per Fleischner Society Guidelines. These guidelines do not apply to immunocompromised patients and patients with cancer. Follow up in patients with significant comorbidities as clinically warranted. For lung cancer screening, adhere to Lung-RADS guidelines. Reference: Radiology. 2017; 284(1):228-43. Electronically Signed   By: BJeannine BogaM.D.   On: 08/29/2021 03:02   MR BRAIN WO CONTRAST  Result Date: 08/28/2021 CLINICAL DATA:  Binocular visual field changes.  Vision loss. EXAM: MRI HEAD WITHOUT CONTRAST TECHNIQUE: Multiplanar, multiecho pulse sequences of the brain and surrounding structures  were obtained without intravenous contrast. COMPARISON:  MR head without contrast 03/24/2018 FINDINGS: Brain: Scattered punctate foci of restricted diffusion are noted. A focal lesion is noted along the postcentral gyrus on image 88 of series 5 subcortical punctate focus of restricted  diffusion is present in the left parietal lobe on image 83 of series 5 with minimal associated T2 hyperintensity punctate focus is present in the right temporoparietal lobe on image 75 of series 5. A punctate focus is present in the right superior cerebellum on image 62 of series 5. The remote foci of susceptibility most notably along the right splenium of the corpus callosum are stable. Additional lesions are present in the right cerebellum and left frontal lobe. A The internal auditory canals are within normal limits. Other remote lacunar infarcts are present in the cerebellum bilaterally. Brainstem is within normal limits. The ventricles are of normal size. No significant extraaxial fluid collection is present. Vascular: Flow is present in the major intracranial arteries. Skull and upper cervical spine: Degenerative changes of the upper cervical spine have progressed. Craniocervical junction is normal. Marrow signal is normal. Sinuses/Orbits: The paranasal sinuses and mastoid air cells are clear. Bilateral lens replacements are noted. Globes and orbits are otherwise unremarkable. IMPRESSION: 1. Scattered punctate foci of restricted diffusion as described. The finding is nonspecific but can be seen in the setting of acute/subacute ischemia, a demyelinating process such as multiple sclerosis, vasculitis, complicated migraine headaches, or as the sequelae of a prior infectious or inflammatory process. 2. Other remote lacunar infarcts of the cerebellum bilaterally. 3. Progressive degenerative changes of the upper cervical spine. Electronically Signed   By: San Morelle M.D.   On: 08/28/2021 15:20   ECHOCARDIOGRAM COMPLETE  Result Date: 08/27/2021    ECHOCARDIOGRAM REPORT   Patient Name:   Terrence P Burley Date of Exam: 08/27/2021 Medical Rec #:  [id number]        Height:       62.0 in Accession #:    [id number]       Weight:       132.0 lb Date of Birth:  [birth year]        BSA:          1.602 m Patient Age:     65 years         BP:           106/58 mmHg Patient Gender: F                HR:           78 bpm. Exam Location:  Inpatient Procedure: 2D Echo, Cardiac Doppler and Color Doppler Indications:    NSTEMI I21.4  History:        Patient has prior history of Echocardiogram examinations, most                 recent 08/08/2021. Stroke, Arrythmias:Bradycardia; Risk                 Factors:Diabetes, GERD, Hypertension and Sleep Apnea.  Sonographer:    Bernadene Person RDCS Referring Phys: [id number] [place]  1. Right ventricular systolic function is moderately reduced. The right ventricular size is mildly enlarged. There is moderately elevated pulmonary artery systolic pressure. The estimated right ventricular systolic pressure is 55.7 mmHg. Normal apical function with free wall hypokinesis, consistent with McConnell's sign as can be seen in acute PE (image 58). Consider evaluation for PE.  2. Left ventricular ejection fraction, by estimation, is  60 to 65%. The left ventricle has normal function. The left ventricle has no regional wall motion abnormalities. Left ventricular diastolic parameters are consistent with Grade III diastolic dysfunction (restrictive). Elevated left atrial pressure.  3. Left atrial size was severely dilated.  4. Right atrial size was severely dilated.  5. The mitral valve is degenerative. Mild mitral valve regurgitation. No evidence of mitral stenosis. Moderate mitral annular calcification.  6. Tricuspid valve regurgitation is mild to moderate.  7. The aortic valve is tricuspid. Aortic valve regurgitation is not visualized. Aortic valve sclerosis is present, with no evidence of aortic valve stenosis.  8. The inferior vena cava is dilated in size with <50% respiratory variability, suggesting right atrial pressure of 15 mmHg. FINDINGS  Left Ventricle: Left ventricular ejection fraction, by estimation, is 60 to 65%. The left ventricle has normal function. The left ventricle has no  regional wall motion abnormalities. The left ventricular internal cavity size was normal in size. There is  no left ventricular hypertrophy. Left ventricular diastolic parameters are consistent with Grade III diastolic dysfunction (restrictive). Elevated left atrial pressure. Right Ventricle: The right ventricular size is mildly enlarged. No increase in right ventricular wall thickness. Right ventricular systolic function is moderately reduced. There is moderately elevated pulmonary artery systolic pressure. The tricuspid regurgitant velocity is 2.79 m/s, and with an assumed right atrial pressure of 15 mmHg, the estimated right ventricular systolic pressure is 78.2 mmHg. Left Atrium: Left atrial size was severely dilated. Right Atrium: Right atrial size was severely dilated. Pericardium: There is no evidence of pericardial effusion. Mitral Valve: The mitral valve is degenerative in appearance. Moderate mitral annular calcification. Mild mitral valve regurgitation. No evidence of mitral valve stenosis. Tricuspid Valve: The tricuspid valve is normal in structure. Tricuspid valve regurgitation is mild to moderate. Aortic Valve: The aortic valve is tricuspid. Aortic valve regurgitation is not visualized. Aortic valve sclerosis is present, with no evidence of aortic valve stenosis. Pulmonic Valve: The pulmonic valve was not well visualized. Pulmonic valve regurgitation is not visualized. Aorta: The aortic root and ascending aorta are structurally normal, with no evidence of dilitation. Venous: The inferior vena cava is dilated in size with less than 50% respiratory variability, suggesting right atrial pressure of 15 mmHg. IAS/Shunts: The interatrial septum was not well visualized.  LEFT VENTRICLE PLAX 2D LVIDd:         4.20 cm     Diastology LVIDs:         2.60 cm     LV e' medial:    6.46 cm/s LV PW:         0.80 cm     LV E/e' medial:  17.3 LV IVS:        0.70 cm     LV e' lateral:   8.25 cm/s LVOT diam:     1.90 cm      LV E/e' lateral: 13.6 LV SV:         67 LV SV Index:   42 LVOT Area:     2.84 cm  LV Volumes (MOD) LV vol d, MOD A2C: 52.2 ml LV vol d, MOD A4C: 58.5 ml LV vol s, MOD A2C: 24.0 ml LV vol s, MOD A4C: 19.1 ml LV SV MOD A2C:     28.2 ml LV SV MOD A4C:     58.5 ml LV SV MOD BP:      33.7 ml RIGHT VENTRICLE RV S prime:     9.76 cm/s TAPSE (M-mode): 1.0 cm  LEFT ATRIUM             Index        RIGHT ATRIUM           Index LA diam:        4.50 cm 2.81 cm/m   RA Area:     26.80 cm LA Vol (A2C):   87.2 ml 54.42 ml/m  RA Volume:   98.10 ml  61.23 ml/m LA Vol (A4C):   94.0 ml 58.67 ml/m LA Biplane Vol: 91.0 ml 56.80 ml/m  AORTIC VALVE LVOT Vmax:   119.00 cm/s LVOT Vmean:  80.000 cm/s LVOT VTI:    0.235 m  AORTA Ao Root diam: 3.20 cm Ao Asc diam:  3.50 cm MITRAL VALVE                TRICUSPID VALVE MV Area (PHT): 3.91 cm     TR Peak grad:   31.1 mmHg MV Decel Time: 194 msec     TR Vmax:        279.00 cm/s MV E velocity: 112.00 cm/s MV A velocity: 40.30 cm/s   SHUNTS MV E/A ratio:  2.78         Systemic VTI:  0.24 m                             Systemic Diam: 1.90 cm Oswaldo Milian MD Electronically signed by Oswaldo Milian MD Signature Date/Time: 08/27/2021/5:23:14 PM    Final     Medications:  sodium chloride Stopped (08/27/21 0654)   sodium chloride      apixaban  2.5 mg Oral BID   Chlorhexidine Gluconate Cloth  6 each Topical Q0600   darbepoetin (ARANESP) injection - DIALYSIS  25 mcg Intravenous Q Tue-HD   escitalopram  20 mg Oral QHS   insulin aspart  0-9 Units Subcutaneous TID WC   insulin aspart  4 Units Subcutaneous TID WC   lipase/protease/amylase  36,000 Units Oral With snacks   lipase/protease/amylase  72,000 Units Oral TID WC   metoprolol succinate  12.5 mg Oral QHS   midodrine  10 mg Oral Q M,W,F-HD   multivitamin  1 tablet Oral QHS   polyethylene glycol  17 g Oral Daily   sevelamer carbonate  1,600 mg Oral TID WC   sodium chloride flush  3 mL Intravenous Q12H    Dialysis  Orders: MWF- [facility] [id number], BFR 350, DFR Auto 1.5,  EDW 57kg, 2K/ 2Ca Access: L AVF  Heparin 2000 units bolus with HD Mircera 75 mcg q4wks - new order has not been started yet Calcitriol 110m PO qHD- last 08/25/21   Assessment/Plan: NSTEMI-Cardiology following, s/p LHC today which showed non-obstructive CAD, intolerant to statin, starting Eliquis tonight BID Afib with RVR-Continue Metoprolol and Eliquis ESRD - on HD MWF, off schedule. Informed of bleeding from AV access near end of treatment yesterday, blood around 100-200cc. Patient reports this has been an ongoing issue at her outpatient HD center. Now on Eliquis for 2nd prevention for CVA. Reviewed access flow trend which appear stable. Noted 2nd stage L AVF performed by Dr. BrTrula Sladen May 2019. I will consult [facility] for possible Fistulogram. Hypertension/volume- BP stable, euvolemic on exam Anemia of CKD -Hgb now 7.9, ESA and Fe recently given with HD yesterday, will resume Fe load while she remains inpatient. Secondary Hyperparathyroidism - Will check PO4 in AM, Ca ok. Nutrition - Renal diet with fluid restriction Dispo-patient  remains inpatient  Kim Poet, NP [facility] 08/29/2021,1:11 PM  LOS: 3 days

## 2021-08-29 NOTE — Progress Notes (Signed)
Pt doe not receive insulin when blood sugar is over 200 per family. Please be advised.

## 2021-08-29 NOTE — Progress Notes (Signed)
PROGRESS NOTE    Kim Burns  TMH:[id number]  DOB: Jul 09, 1935  DOA: 08/25/2021 PCP: [name], MD Outpatient Specialists:   Hospital course:  86 yo female with the past medical history of ESRD on HD, IPMN sp whipple procedure 2018, renal cell carcinoma sp ablation and embolization per IR on 08/22/21 was admitted 08/25/2021 with A-fib with RVR with spontaneous conversion to NSR.  Patient was noted to have elevated troponin thought to be secondary to demand ischemia.  Patient subsequently noted abnormalities with vision and MRI was ordered which showed small foci with restricted diffusion.  Subjective:  Patient without new complaints.  Objective: Vitals:   08/29/21 0131 08/29/21 0505 08/29/21 0728 08/29/21 1200  BP: (!) 108/59 (!) 100/52 106/63 (!) 110/58  Pulse: 70 71 66 69  Resp: 18 15 (!) 22 19  Temp: 99.1 F (37.3 C) 98.5 F (36.9 C) 98.5 F (36.9 C) 98.2 F (36.8 C)  TempSrc: Oral Oral Oral Oral  SpO2: 96% 95% 96% 96%  Weight: 58.1 kg     Height:        Intake/Output Summary (Last 24 hours) at 08/29/2021 1501 Last data filed at 08/29/2021 1400 Gross per 24 hour  Intake 460 ml  Output 300 ml  Net 160 ml    Filed Weights   08/28/21 0808 08/28/21 1225 08/29/21 0131  Weight: 49.7 kg 56 kg 58.1 kg     Exam:  General: Patient with disconjugate gaze and mild possible mild exophthalmos on right eye. Eyes: sclera anicteric, conjuctiva mild injection bilaterally CVS: S1-S2, regular  Respiratory:  decreased air entry bilaterally secondary to decreased inspiratory effort, rales at bases  GI: NABS, soft, NT  LE: No edema.  Neuro: A/O x 3, Moving all extremities equally with normal strength, CN 3-12 intact, grossly nonfocal.  Psych: patient is logical and coherent, judgement and insight appear normal, mood and affect appropriate to situation.   Assessment & Plan:   Vision changes Appreciate neurology consultation--history obtained by them is that she  has visual problems during dialysis that are associated with episodes of low blood pressure. Plan is for better BP control during HD No further work-up is needed.  Bleeding from fistula Patient will need fistulogram tomorrow Patient was recently started on Eliquis  A-fib with RVR Spontaneous conversion to sinus rhythm Continue metoprolol for rate control Eliquis for secondary prevention of stroke.  Elevated troponin LHC with nonobstructive CAD NSTEMI thought to be secondary to demand ischemia Patient is intolerant to statins  RCC of left kidney S/p ablation on 7 5 with coil embolization Dr. Pascal Lux cleared anticoagulation/Eliquis  ESRD HD per nephrology Midodrine on HD days    DVT prophylaxis: Eliquis Code Status: Full Family Communication: Patient's daughter was at bedside Disposition Plan:   Patient is from: Home  Anticipated Discharge Location: Home  Barriers to Discharge: Awaiting neuro evaluation  Is patient medically stable for Discharge: Not yet   Scheduled Meds:  apixaban  2.5 mg Oral BID   Chlorhexidine Gluconate Cloth  6 each Topical Q0600   [START ON 09/04/2021] darbepoetin (ARANESP) injection - DIALYSIS  60 mcg Intravenous Q Tue-HD   escitalopram  20 mg Oral QHS   insulin aspart  0-9 Units Subcutaneous TID WC   insulin aspart  4 Units Subcutaneous TID WC   lipase/protease/amylase  36,000 Units Oral With snacks   lipase/protease/amylase  72,000 Units Oral TID WC   metoprolol succinate  12.5 mg Oral QHS   midodrine  10 mg Oral  Q M,W,F-HD   multivitamin  1 tablet Oral QHS   polyethylene glycol  17 g Oral Daily   sevelamer carbonate  1,600 mg Oral TID WC   sodium chloride flush  3 mL Intravenous Q12H   Continuous Infusions:  sodium chloride Stopped (08/27/21 0654)   sodium chloride     [START ON 08/30/2021] ferric gluconate (FERRLECIT) IVPB      Data Reviewed:  Basic Metabolic Panel: Recent Labs  Lab 08/25/21 1917 08/26/21 0447 08/27/21 0348  08/28/21 0457 08/29/21 0209  NA 134* 132* 132* 130* 133*  K 3.9 4.0 3.8 4.4 3.5  CL 89* 93* 92* 93* 92*  CO2 '27 24 22 22 27  '$ GLUCOSE 180* 166* 159* 137* 151*  BUN 27* 34* 50* 63* 34*  CREATININE 3.63* 3.87* 4.98* 5.83* 3.95*  CALCIUM 8.7* 8.4* 8.6* 8.3* 8.5*     CBC: Recent Labs  Lab 08/25/21 1917 08/26/21 0447 08/27/21 0348 08/28/21 0457 08/28/21 1610 08/29/21 0209  WBC 13.1* 9.6 8.9 7.8 8.3 6.6  NEUTROABS 11.3*  --   --   --   --   --   HGB 10.7* 8.9* 9.2* 8.7* 8.3* 7.9*  HCT 33.2* 28.4* 29.5* 27.3* 26.3* 24.9*  MCV 90.7 90.7 92.8 90.1 88.6 90.5  PLT 219 207 246 282 292 289     Studies: CT ANGIO HEAD NECK W WO CM  Result Date: 08/29/2021 CLINICAL DATA:  Follow-up examination for stroke. EXAM: CT ANGIOGRAPHY HEAD AND NECK TECHNIQUE: Multidetector CT imaging of the head and neck was performed using the standard protocol during bolus administration of intravenous contrast. Multiplanar CT image reconstructions and MIPs were obtained to evaluate the vascular anatomy. Carotid stenosis measurements (when applicable) are obtained utilizing NASCET criteria, using the distal internal carotid diameter as the denominator. RADIATION DOSE REDUCTION: This exam was performed according to the departmental dose-optimization program which includes automated exposure control, adjustment of the mA and/or kV according to patient size and/or use of iterative reconstruction technique. CONTRAST:  58m OMNIPAQUE IOHEXOL 350 MG/ML SOLN COMPARISON:  MRI from earlier the same day. FINDINGS: CT HEAD FINDINGS Brain: Generalized age-related cerebral atrophy with mild chronic small vessel ischemic disease. Previously identified punctate ischemic changes not visible by CT. No other acute large vessel territory infarct. No acute intracranial hemorrhage. No mass lesion, mass effect, or midline shift. No hydrocephalus or extra-axial fluid collection. Vascular: No abnormal hyperdense vessel. Calcified atherosclerosis  present about the skull base. Skull: Scalp soft tissues and calvarium within normal limits. Sinuses: Scattered mucoperiosteal thickening present about the ethmoidal air cells. Paranasal sinuses are otherwise clear. No significant mastoid effusion. Orbits: Globes orbital soft tissues demonstrate no acute finding. Review of the MIP images confirms the above findings CTA NECK FINDINGS Aortic arch: Visualized aortic arch normal caliber with standard 3 vessel morphology. Mild-to-moderate atheromatous change about the arch itself. No significant stenosis about the origin the great vessels. Right carotid system: Right common and internal carotid arteries are patent without dissection or other acute finding. Eccentric calcified plaque about the right carotid bulb without hemodynamically significant stenosis. Left carotid system: Left common and internal carotid arteries are patent without dissection or other acute finding. Mild calcified plaque about the left carotid bulb without hemodynamically significant stenosis. Vertebral arteries: Both vertebral arteries arise from subclavian arteries. No proximal subclavian artery stenosis. Right vertebral artery dominant. Vertebral arteries patent without stenosis or dissection. Skeleton: No discrete or worrisome osseous lesions. Moderate cervical spondylosis at C3-4 through C6-7. Other neck: No other acute soft  tissue abnormality within the neck. Upper chest: 3 mm nodule within the superior segment of the left lower lobe noted, stable. Scattered atelectatic changes noted elsewhere within the visualized lungs. Review of the MIP images confirms the above findings CTA HEAD FINDINGS Anterior circulation: Both internal carotid arteries patent to the termini without stenosis. A1 segments patent bilaterally. Normal anterior communicating artery complex. Anterior cerebral arteries patent without stenosis. No M1 stenosis or occlusion. Normal MCA bifurcations. Distal MCA branches perfused  and symmetric. Posterior circulation: Both vertebral arteries patent without stenosis. Right vertebral artery dominant. Neither PICA well visualized. Basilar patent to its distal aspect without stenosis. Superior cerebral arteries patent bilaterally. Both PCAs well perfused and patent to their distal aspects. Venous sinuses: Patent allowing for timing the contrast bolus. Anatomic variants: As above.  No aneurysm. Review of the MIP images confirms the above findings IMPRESSION: 1. Negative CTA of the head and neck for large vessel occlusion or other emergent finding. 2. Mild for age atheromatous disease about the aortic arch and carotid bifurcations without hemodynamically significant stenosis. 3. 3 mm left lower lobe nodule, indeterminate. No routine follow-up imaging is recommended per Fleischner Society Guidelines. These guidelines do not apply to immunocompromised patients and patients with cancer. Follow up in patients with significant comorbidities as clinically warranted. For lung cancer screening, adhere to Lung-RADS guidelines. Reference: Radiology. 2017; 284(1):228-43. Electronically Signed   By: Jeannine Boga M.D.   On: 08/29/2021 03:02   MR BRAIN WO CONTRAST  Result Date: 08/28/2021 CLINICAL DATA:  Binocular visual field changes.  Vision loss. EXAM: MRI HEAD WITHOUT CONTRAST TECHNIQUE: Multiplanar, multiecho pulse sequences of the brain and surrounding structures were obtained without intravenous contrast. COMPARISON:  MR head without contrast 03/24/2018 FINDINGS: Brain: Scattered punctate foci of restricted diffusion are noted. A focal lesion is noted along the postcentral gyrus on image 88 of series 5 subcortical punctate focus of restricted diffusion is present in the left parietal lobe on image 83 of series 5 with minimal associated T2 hyperintensity punctate focus is present in the right temporoparietal lobe on image 75 of series 5. A punctate focus is present in the right superior  cerebellum on image 62 of series 5. The remote foci of susceptibility most notably along the right splenium of the corpus callosum are stable. Additional lesions are present in the right cerebellum and left frontal lobe. A The internal auditory canals are within normal limits. Other remote lacunar infarcts are present in the cerebellum bilaterally. Brainstem is within normal limits. The ventricles are of normal size. No significant extraaxial fluid collection is present. Vascular: Flow is present in the major intracranial arteries. Skull and upper cervical spine: Degenerative changes of the upper cervical spine have progressed. Craniocervical junction is normal. Marrow signal is normal. Sinuses/Orbits: The paranasal sinuses and mastoid air cells are clear. Bilateral lens replacements are noted. Globes and orbits are otherwise unremarkable. IMPRESSION: 1. Scattered punctate foci of restricted diffusion as described. The finding is nonspecific but can be seen in the setting of acute/subacute ischemia, a demyelinating process such as multiple sclerosis, vasculitis, complicated migraine headaches, or as the sequelae of a prior infectious or inflammatory process. 2. Other remote lacunar infarcts of the cerebellum bilaterally. 3. Progressive degenerative changes of the upper cervical spine. Electronically Signed   By: San Morelle M.D.   On: 08/28/2021 15:20    Principal Problem:   Atrial fibrillation with RVR ([place]) Active Problems:   NSTEMI (non-ST elevated myocardial infarction) ([place])   End stage renal  disease on dialysis [facility])   Renal cell carcinoma of left kidney ([place])   IPMN (intraductal papillary mucinous neoplasm)   Diabetes mellitus type 2, controlled ([place])   Pulmonary nodule   Atrial fibrillation ([facility])   Elevated troponin   Chest pain of uncertain etiology     Omayra Tulloch Derek Jack, [facility]  If 7PM-7AM, please contact night-coverage [email or web address]   LOS: 3 days

## 2021-08-29 NOTE — Consult Note (Addendum)
Hospital Consult    Reason for Consult:  bleeding from fistula Requesting Physician:  Tobie Poet, NP MRN #:  [id number]  History of Present Illness: This is a 86 y.o. female with Type II DM, HTN, Anemia, CAD, RCC, IPMN s/p whipple, and ESRD on HD MWF who presented to ED  on 08/26/21 with chest pain. She subsequently underwent left heart catheterization today with non obstructive CAD. She has been started on Eliquis. She has a functioning left brachiobasilic AV fistula that was created in March of 2019 with subsequent transposition in May of 2019 by Dr. Trula Slade. She did not initiate HD until January of 2023. She explains that since initiation of dialysis she has had bleeding issues following her HD sessions. She says that the bleeding has worsened especially over past several sessions requiring prolonged manual pressure to obtain hemostasis. She says rarely does she have any bleeding at home or between sessions. She does normally leave dressings on her fistula for 24 hours. Otherwise she reports that her fistula has been working well. She has had several times where it clotted over last several months but has not required any intervention. Vascular surgery has been consulted for evaluation of recurrent bleeding from her left AV fistula.   Past Medical History:  Diagnosis Date   Anemia of chronic disease    takes iron   Anxiety    Blood transfusion without reported diagnosis    Bradycardia    Diabetes mellitus without complication ([place])    became diabetic after Whipple procedure   Diarrhea    Dysrhythmia 04/16/2016   bradycardia due to medication    ESRD on hemodialysis [facility])    M-W-F   GERD (gastroesophageal reflux disease)    Gout    Headache    History of kidney stones 06/2013   Hyperparathyroidism ([place])    Hypertension    PONV (postoperative nausea and vomiting)    Sleep apnea    no cpap machine. could not tolerate   Stroke ([place]) 04/16/2016   TIA 1995   Vitamin D deficiency      Past Surgical History:  Procedure Laterality Date   ABDOMINAL HYSTERECTOMY  1985   complete   BACK SURGERY  2409   lower   BASCILIC VEIN TRANSPOSITION Left 04/24/2017   Procedure: LEFT ARM FIRST STAGE BASILIC VEIN TRANSPOSITION;  Surgeon: Serafina Mitchell, MD;  Location: [place];  Service: Vascular;  Laterality: Left;   [place] Left 07/10/2017   Procedure: SECOND STAGE BASILIC VEIN TRANSPOSITION LEFT ARM;  Surgeon: Serafina Mitchell, MD;  Location[facility];  Service: Vascular;  Laterality: Left;   BREAST LUMPECTOMY Left x 2   many years apart, benign   CHOLECYSTECTOMY  1985   CYSTOSCOPY WITH URETEROSCOPY AND STENT PLACEMENT Left 06/18/2013   Procedure: CYSTOSCOPY WITH Lef URETEROSCOPY AND Left STENT PLACEMENT;  Surgeon: Dutch Gray, MD;  Location: [facility];  Service: Urology;  Laterality: Left;   ESOPHAGOGASTRODUODENOSCOPY (EGD) WITH PROPOFOL N/A 11/13/2016   Procedure: ESOPHAGOGASTRODUODENOSCOPY (EGD) WITH PROPOFOL;  Surgeon: Otis Brace, MD;  Location: [place];  Service: Gastroenterology;  Laterality: N/A;   EUS N/A 02/07/2016   Procedure: ESOPHAGEAL ENDOSCOPIC ULTRASOUND (EUS) RADIAL;  Surgeon: Arta Silence, MD;  [facility];  Service: Endoscopy;  Laterality: N/A;   EYE SURGERY Bilateral 2014   ioc for catracts    FOOT SURGERY Left 1990   something with toes unsure what    HERNIA REPAIR  2018   IR EMBO TUMOR ORGAN ISCHEMIA  INFARCT INC GUIDE ROADMAPPING  08/22/2021   IR RADIOLOGIST EVAL & MGMT  05/18/2021   IR RENAL SUPRASEL UNI S&I MOD SED  08/22/2021   IR US GUIDE VASC ACCESS RIGHT  08/22/2021   LEFT HEART CATH AND CORONARY ANGIOGRAPHY N/A 08/27/2021   Procedure: LEFT HEART CATH AND CORONARY ANGIOGRAPHY;  Surgeon: Burnell Blanks, MD;  Location: [place] [facility];  Service: Cardiovascular;  Laterality: N/A;   PARATHYROID EXPLORATION     RADIOLOGY WITH ANESTHESIA Left 08/22/2021   Procedure: MICROWAVE ABLATION;  Surgeon: Sandi Mariscal, MD;   Location: [facility];  Service: Anesthesiology;  Laterality: Left;   WHIPPLE PROCEDURE N/A 04/23/2016   Procedure: WHIPPLE PROCEDURE;  Surgeon: Stark Klein, MD;  Location[facility];  Service: General;  Laterality: N/A;    Allergies  Allergen Reactions   Gemfibrozil Other (See Comments)    Myalgia    Lotemax [Loteprednol Etabonate] Other (See Comments)    burning   Statins Other (See Comments)    Muscle weakness   Doxycycline Other (See Comments)    UNSPECIFIED REACTION    Codeine Other (See Comments)    headache   Hydrocodone-Acetaminophen Other (See Comments)    Headache. Tolerates acetaminophen.    Prior to Admission medications   Medication Sig Start Date End Date Taking? Authorizing Provider  acetaminophen (TYLENOL 8 HOUR ARTHRITIS PAIN) 650 MG CR tablet Take 1,300 mg by mouth at bedtime.   Yes [provider]  Biotin 10000 MCG TABS Take 20,000 mcg by mouth in the morning and at bedtime.   Yes [provider]  colestipol (COLESTID) 1 g tablet Take 1 g by mouth 2 (two) times daily as needed (diarrhea).   Yes [provider]  diclofenac sodium (VOLTAREN) 1 % GEL Apply 2 g topically 4 (four) times daily as needed (for hand pain).    Yes [provider]  escitalopram (LEXAPRO) 20 MG tablet Take 20 mg by mouth at bedtime.   Yes [provider]  insulin aspart (NOVOLOG FLEXPEN) 100 UNIT/ML FlexPen Inject 10 Units into the skin 3 (three) times daily with meals.   Yes [provider]  lidocaine-prilocaine (EMLA) cream Apply 1 Application topically as needed (port access).   Yes [provider]  Lifitegrast 5 % SOLN Place 1 drop into both eyes at bedtime.   Yes [provider]  lipase/protease/amylase (CREON) 36000 UNITS CPEP capsule Take 24,235-36,144 Units by mouth See admin instructions. 72,000 units (3 caps) with each meal and 36,000 units (1 cap) with snacks   Yes [provider]  midodrine (PROAMATINE) 10 MG  tablet Take 10 mg by mouth every Monday, Wednesday, and Friday with hemodialysis.   Yes [provider]  Multiple Vitamins-Minerals (PRESERVISION AREDS 2+MULTI VIT PO) Take 1 capsule by mouth in the morning and at bedtime.   Yes [provider]  oxyCODONE-acetaminophen (PERCOCET) 5-325 MG tablet Take 1 tablet by mouth every 6 (six) hours as needed for severe pain. 08/23/21  Yes Lura Em, PA  Propylene Glycol 0.6 % SOLN Place 1 drop into both eyes 4 (four) times daily as needed (for dry eyes).   Yes [provider]  sevelamer carbonate (RENVELA) 800 MG tablet Take 1,600 mg by mouth 3 (three) times daily with meals.   Yes [provider]    Social History   Socioeconomic History   Marital status: Widowed    Spouse name: Not on file   Number of children: Not on file  Years of education: Not on file   Highest education level: Not on file  Occupational History   Not on file  Tobacco Use   Smoking status: Never   Smokeless tobacco: Never  Vaping Use   Vaping Use: Never used  Substance and Sexual Activity   Alcohol use: No   Drug use: No   Sexual activity: Not on file  Other Topics Concern   Not on file  Social History Narrative   Not on file   Social Determinants of Health   Financial Resource Strain: Not on file  Food Insecurity: Not on file  Transportation Needs: Not on file  Physical Activity: Not on file  Stress: Not on file  Social Connections: Not on file  Intimate Partner Violence: Not on file     Family History  Problem Relation Age of Onset   Heart disease Mother    Stroke Mother    Cancer Father        colon   Alzheimer's disease Sister    Alzheimer's disease Sister    Cancer Brother        brain   Breast cancer Neg Hx     ROS: Otherwise negative unless mentioned in HPI  Physical Examination  Vitals:   08/29/21 0728 08/29/21 1200  BP: 106/63 (!) 110/58  Pulse: 66 69  Resp: (!) 22 19  Temp: 98.5 F (36.9  C) 98.2 F (36.8 C)  SpO2: 96% 96%   Body mass index is 23.43 kg/m.  General:  elderly appearing female, pleasant, in no acute distress Gait: Not observed HENT: WNL, normocephalic Pulmonary: normal non-labored breathing Cardiac: regular Vascular Exam/Pulses: 2+ left radial pulse, left hand warm and well perfused. Left brachiobasilic AV fistula mildly diffusely dilated, very pulsatile but with good thrill. No active bleeding Musculoskeletal: no muscle wasting or atrophy  Neurologic: A&O X 3;  No focal weakness or paresthesias are detected; speech is fluent/normal Psychiatric:  The pt has Normal affect.  CBC    Component Value Date/Time   WBC 6.6 08/29/2021 0209   RBC 2.75 (L) 08/29/2021 0209   HGB 7.9 (L) 08/29/2021 0209   HCT 24.9 (L) 08/29/2021 0209   HCT 28.0 (L) 10/05/2016 1310   PLT 289 08/29/2021 0209   MCV 90.5 08/29/2021 0209   MCH 28.7 08/29/2021 0209   MCHC 31.7 08/29/2021 0209   RDW 15.7 (H) 08/29/2021 0209   LYMPHSABS 1.0 08/25/2021 1917   MONOABS 0.7 08/25/2021 1917   EOSABS 0.0 08/25/2021 1917   BASOSABS 0.0 08/25/2021 1917    BMET    Component Value Date/Time   NA 133 (L) 08/29/2021 0209   K 3.5 08/29/2021 0209   CL 92 (L) 08/29/2021 0209   CO2 27 08/29/2021 0209   GLUCOSE 151 (H) 08/29/2021 0209   BUN 34 (H) 08/29/2021 0209   CREATININE 3.95 (H) 08/29/2021 0209   CALCIUM 8.5 (L) 08/29/2021 0209   CALCIUM 7.6 (L) 02/23/2021 1204   GFRNONAA 11 (L) 08/29/2021 0209   GFRAA 13 (L) 11/12/2019 1240    COAGS: Lab Results  Component Value Date   INR 0.9 08/22/2021   INR 1.11 11/12/2016   INR 1.15 04/24/2016     Non-Invasive Vascular Imaging:   None  Statin:  No. Beta Blocker:  Yes.   Aspirin:  No. ACEI:  No. ARB:  No. CCB use:  No Other antiplatelets/anticoagulants:  Yes.   Eliquis   ASSESSMENT/PLAN: This is a 86 y.o. female with ESRD with left brachiobasilic AV  fistula with recurrent bleeding following HD treatments. Dialyzes MWF. She  was just started on Eliquis today for prevention of CVA following left heart cath. Recommend left upper extremity fistulogram. Discussed procedure with patient and answered questions/ concerns. She is agreeable to proceed. Consent ordered. Will arrange Fistulogram tomorrow with Dr. Tracey Harries PA-C [facility] [phone] 08/29/2021  1:43 PM  I agree with the above.  Have seen and evaluate the patient.  She has a left arm fistula that she uses for dialysis which began in January of this year.  She is having recurrent episodes of bleeding after dialysis treatment.  She was admitted on 08/25/2021 with atrial fibrillation with RVR which spontaneously converted.  She did have some visual abnormalities and MRI showed a small foci of restricted diffusion.  She has been started on Eliquis.  She has a pulsatile feeling within her fistula suggesting a central venous stenosis as the cause of her bleeding issues.  We will plan for a fistulogram tomorrow by Dr. Carlis Abbott.  She will be n.p.o. after midnight.  I am holding tonight's dose of Eliquis.  Annamarie Major

## 2021-08-29 NOTE — Evaluation (Signed)
Physical Therapy Evaluation Patient Details Name: Kim Burns MRN: [id number] DOB: Jul 13, 1935 Today's Date: 08/29/2021  History of Present Illness  86 y.o. female presents to [facility] hospital on 08/25/2021 with afib with RVR and elevated troponins. Pt also reporting vision changes starting 7/5, MRI 7/11 demonstrates scattered punctate foci. PMH includes HTN, whipple procedure, ESRD, GERD, TIA, DMII  Clinical Impression  Pt presents with mild gait and strength deficits, however she reports that mobility feels at her baseline level. Pt ambulates household distance with a rolling walker (baseline). Pt didn't perform stairs today, but reports she would feel comfortable negotiating stairs upon discharge. No further PT is recommended for the Pt. PT is signing-off.     Recommendations for follow up therapy are one component of a multi-disciplinary discharge planning process, led by the attending physician.  Recommendations may be updated based on patient status, additional functional criteria and insurance authorization.  Follow Up Recommendations No PT follow up      Assistance Recommended at Discharge PRN  Patient can return home with the following  Other (comment) (Pt can return home with PRN assistance)    Equipment Recommendations None recommended by PT  Recommendations for Other Services       Functional Status Assessment Patient has not had a recent decline in their functional status     Precautions / Restrictions Precautions Precautions: Fall Restrictions Weight Bearing Restrictions: No      Mobility  Bed Mobility Overal bed mobility: Modified Independent                  Transfers Overall transfer level: Modified independent Equipment used: Rolling walker (2 wheels)                    Ambulation/Gait Ambulation/Gait assistance: Modified independent (Device/Increase time) Gait Distance (Feet): 120 Feet Assistive device: Rolling walker (2 wheels) Gait  Pattern/deviations: Step-through pattern, Decreased step length - right, Decreased step length - left, Decreased stride length Gait velocity: decreased Gait velocity interpretation: 1.31 - 2.62 ft/sec, indicative of limited community ambulator   General Gait Details: Pt reports that gait velocity and distance today feel similar to her baseline  Stairs            Wheelchair Mobility    Modified Rankin (Stroke Patients Only) Modified Rankin (Stroke Patients Only) Pre-Morbid Rankin Score: No symptoms Modified Rankin: No significant disability     Balance Overall balance assessment: Needs assistance Sitting-balance support: No upper extremity supported, Feet supported Sitting balance-Leahy Scale: Good     Standing balance support: Bilateral upper extremity supported, During functional activity Standing balance-Leahy Scale: Poor                               Pertinent Vitals/Pain Pain Assessment Pain Assessment: 0-10 Pain Score: 5  Pain Location: Head (Pt reports frequent headaches) Pain Descriptors / Indicators: Aching Pain Intervention(s): Monitored during session    Home Living Family/patient expects to be discharged to:: Private residence Living Arrangements: Alone Available Help at Discharge: Family;Available PRN/intermittently Type of Home: House Home Access: Stairs to enter Entrance Stairs-Rails: Left Entrance Stairs-Number of Steps: 13 (Need to clarify with Pt due to her unclear explanation of a split level home)   Home Layout: Two level;Able to live on main level with bedroom/bathroom Home Equipment: Rolling Walker (2 wheels);Cane - single point (Pt reports she is getting grab bars installed soon)      Prior Function Prior  Level of Function : Independent/Modified Independent             Mobility Comments: Pt reports ambulating with RW or furniture walking; says she doesn't ambulate in the community ADLs Comments: Pt reports she does all  her ADLs for herself     Hand Dominance        Extremity/Trunk Assessment   Upper Extremity Assessment Upper Extremity Assessment: Overall WFL for tasks assessed    Lower Extremity Assessment Lower Extremity Assessment: Overall WFL for tasks assessed    Cervical / Trunk Assessment Cervical / Trunk Assessment: Normal  Communication   Communication: No difficulties  Cognition Arousal/Alertness: Awake/alert Behavior During Therapy: WFL for tasks assessed/performed Overall Cognitive Status: Within Functional Limits for tasks assessed                                          General Comments General comments (skin integrity, edema, etc.): PT does visual tests with normal findings; Pt c/o blurry/wavy vision that has improved in the past day    Exercises     Assessment/Plan    PT Assessment Patient does not need any further PT services  PT Problem List         PT Treatment Interventions      PT Goals (Current goals can be found in the Care Plan section)  Acute Rehab PT Goals Patient Stated Goal: "Get up and do my own thing" PT Goal Formulation: With patient Time For Goal Achievement: 09/12/21 Potential to Achieve Goals: Good    Frequency       Co-evaluation               AM-PAC PT "6 Clicks" Mobility  Outcome Measure Help needed turning from your back to your side while in a flat bed without using bedrails?: None Help needed moving from lying on your back to sitting on the side of a flat bed without using bedrails?: None Help needed moving to and from a bed to a chair (including a wheelchair)?: None Help needed standing up from a chair using your arms (e.g., wheelchair or bedside chair)?: None Help needed to walk in hospital room?: None Help needed climbing 3-5 steps with a railing? : None 6 Click Score: 24    End of Session   Activity Tolerance: Patient tolerated treatment well Patient left: in bed;with call bell/phone within  reach Nurse Communication: Mobility status PT Visit Diagnosis: Other abnormalities of gait and mobility (R26.89);Other symptoms and signs involving the nervous system (R29.898)    Time: 6761-9509 PT Time Calculation (min) (ACUTE ONLY): 30 min   Charges:              Hall Busing, SPT Acute Rehabilitation Office #: [phone]   Hall Busing 08/29/2021, 2:50 PM

## 2021-08-30 ENCOUNTER — Encounter (HOSPITAL_COMMUNITY): Payer: Self-pay

## 2021-08-30 ENCOUNTER — Inpatient Hospital Stay (HOSPITAL_COMMUNITY)
Admission: EM | Disposition: A | Discharge status: Home / Self Care | Payer: Self-pay | Source: Home / Self Care | Attending: Internal Medicine

## 2021-08-30 ENCOUNTER — Other Ambulatory Visit (HOSPITAL_COMMUNITY): Payer: Self-pay

## 2021-08-30 DIAGNOSIS — N186 End stage renal disease: Secondary | ICD-10-CM

## 2021-08-30 DIAGNOSIS — I4891 Unspecified atrial fibrillation: Secondary | ICD-10-CM | POA: Diagnosis not present

## 2021-08-30 DIAGNOSIS — Z992 Dependence on renal dialysis: Secondary | ICD-10-CM

## 2021-08-30 DIAGNOSIS — T82838A Hemorrhage of vascular prosthetic devices, implants and grafts, initial encounter: Secondary | ICD-10-CM

## 2021-08-30 HISTORY — PX: A/V FISTULAGRAM: CATH118298

## 2021-08-30 HISTORY — PX: PERIPHERAL VASCULAR BALLOON ANGIOPLASTY: CATH118281

## 2021-08-30 LAB — CBC WITH DIFFERENTIAL/PLATELET
Abs Immature Granulocytes: 0.06 10*3/uL (ref 0.00–0.07)
Basophils Absolute: 0 10*3/uL (ref 0.0–0.1)
Basophils Relative: 0 %
Eosinophils Absolute: 0.1 10*3/uL (ref 0.0–0.5)
Eosinophils Relative: 1 %
HCT: 25.6 % — ABNORMAL LOW (ref 36.0–46.0)
Hemoglobin: 8 g/dL — ABNORMAL LOW (ref 12.0–15.0)
Immature Granulocytes: 1 %
Lymphocytes Relative: 10 %
Lymphs Abs: 1 10*3/uL (ref 0.7–4.0)
MCH: 28 pg (ref 26.0–34.0)
MCHC: 31.3 g/dL (ref 30.0–36.0)
MCV: 89.5 fL (ref 80.0–100.0)
Monocytes Absolute: 0.9 10*3/uL (ref 0.1–1.0)
Monocytes Relative: 9 %
Neutro Abs: 7.5 10*3/uL (ref 1.7–7.7)
Neutrophils Relative %: 79 %
Platelets: 306 10*3/uL (ref 150–400)
RBC: 2.86 MIL/uL — ABNORMAL LOW (ref 3.87–5.11)
RDW: 15.4 % (ref 11.5–15.5)
WBC: 9.5 10*3/uL (ref 4.0–10.5)
nRBC: 0 % (ref 0.0–0.2)

## 2021-08-30 LAB — GLUCOSE, CAPILLARY
Glucose-Capillary: 177 mg/dL — ABNORMAL HIGH (ref 70–99)
Glucose-Capillary: 150 mg/dL — ABNORMAL HIGH (ref 70–99)
Glucose-Capillary: 248 mg/dL — ABNORMAL HIGH (ref 70–99)
Glucose-Capillary: 166 mg/dL — ABNORMAL HIGH (ref 70–99)

## 2021-08-30 LAB — BASIC METABOLIC PANEL
Anion gap: 15 (ref 5–15)
BUN: 40 mg/dL — ABNORMAL HIGH (ref 8–23)
CO2: 23 mmol/L (ref 22–32)
Calcium: 8.4 mg/dL — ABNORMAL LOW (ref 8.9–10.3)
Chloride: 91 mmol/L — ABNORMAL LOW (ref 98–111)
Creatinine, Ser: 5.12 mg/dL — ABNORMAL HIGH (ref 0.44–1.00)
GFR, Estimated: 8 mL/min — ABNORMAL LOW (ref >60)
Glucose, Bld: 146 mg/dL — ABNORMAL HIGH (ref 70–99)
Potassium: 3.6 mmol/L (ref 3.5–5.1)
Sodium: 129 mmol/L — ABNORMAL LOW (ref 135–145)

## 2021-08-30 LAB — PHOSPHORUS: Phosphorus: 4 mg/dL (ref 2.5–4.6)

## 2021-08-30 SURGERY — A/V FISTULAGRAM
Anesthesia: LOCAL | Laterality: Left

## 2021-08-30 MED ORDER — MIDODRINE HCL 10 MG PO TABS
10.0000 mg | ORAL_TABLET | Freq: Every day | ORAL | 0 refills | Status: DC
  Filled 2021-08-30: qty 30, 30d supply, fill #0

## 2021-08-30 MED ORDER — METOPROLOL SUCCINATE ER 25 MG PO TB24
12.5000 mg | ORAL_TABLET | Freq: Every day | ORAL | 0 refills | Status: DC
  Filled 2021-08-30: qty 15, 30d supply, fill #0

## 2021-08-30 MED ORDER — APIXABAN 2.5 MG PO TABS
2.5000 mg | ORAL_TABLET | Freq: Two times a day (BID) | ORAL | 0 refills | Status: DC
  Filled 2021-08-30: qty 60, 30d supply, fill #0

## 2021-08-30 MED ORDER — HEPARIN (PORCINE) IN NACL 1000-0.9 UT/500ML-% IV SOLN
INTRAVENOUS | Status: AC
  Filled 2021-08-30: qty 1000

## 2021-08-30 MED ORDER — HEPARIN SODIUM (PORCINE) 1000 UNIT/ML IJ SOLN
INTRAMUSCULAR | Status: AC
  Filled 2021-08-30: qty 10

## 2021-08-30 MED ORDER — LIDOCAINE HCL (PF) 1 % IJ SOLN
INTRAMUSCULAR | Status: DC | PRN
  Administered 2021-08-30: 2 mL

## 2021-08-30 MED ORDER — PANCRELIPASE (LIP-PROT-AMYL) 36000-114000 UNITS PO CPEP
36000.0000 [IU] | ORAL_CAPSULE | ORAL | 0 refills | Status: AC
  Filled 2021-08-30: qty 270, 90d supply, fill #0

## 2021-08-30 MED ORDER — LIDOCAINE HCL (PF) 1 % IJ SOLN
INTRAMUSCULAR | Status: AC
  Filled 2021-08-30: qty 30

## 2021-08-30 MED ORDER — HEPARIN (PORCINE) IN NACL 1000-0.9 UT/500ML-% IV SOLN
INTRAVENOUS | Status: DC | PRN
  Administered 2021-08-30: 500 mL

## 2021-08-30 MED ORDER — IODIXANOL 320 MG/ML IV SOLN
INTRAVENOUS | Status: DC | PRN
  Administered 2021-08-30: 65 mL

## 2021-08-30 SURGICAL SUPPLY — 19 items
BAG SNAP BAND KOVER 36X36 (MISCELLANEOUS) ×2 IMPLANT
BALLN LUTONIX AV 8X40X75 (BALLOONS) ×2
BALLN MUSTANG 6.0X40 75 (BALLOONS) ×2
BALLN MUSTANG 7.0X40 75 (BALLOONS) ×2
BALLOON LUTONIX AV 8X40X75 (BALLOONS) IMPLANT
BALLOON MUSTANG 6.0X40 75 (BALLOONS) IMPLANT
BALLOON MUSTANG 7.0X40 75 (BALLOONS) IMPLANT
CATH ANGIO 5F BER2 65CM (CATHETERS) ×1 IMPLANT
COVER DOME SNAP 22 D (MISCELLANEOUS) ×2 IMPLANT
KIT ENCORE 26 ADVANTAGE (KITS) ×1 IMPLANT
KIT MICROPUNCTURE NIT STIFF (SHEATH) ×1 IMPLANT
PROTECTION STATION PRESSURIZED (MISCELLANEOUS) ×2
SHEATH PINNACLE R/O II 7F 4CM (SHEATH) ×1 IMPLANT
SHEATH PROBE COVER 6X72 (BAG) ×4 IMPLANT
STATION PROTECTION PRESSURIZED (MISCELLANEOUS) ×1 IMPLANT
STOPCOCK MORSE 400PSI 3WAY (MISCELLANEOUS) ×2 IMPLANT
TRAY PV CATH (CUSTOM PROCEDURE TRAY) ×2 IMPLANT
TUBING CIL FLEX 10 FLL-RA (TUBING) ×2 IMPLANT
WIRE BENTSON .035X145CM (WIRE) ×1 IMPLANT

## 2021-08-30 NOTE — Discharge Summary (Signed)
Kim Burns XBD:[id number] DOB: November 10, 1935 DOA: 08/25/2021  PCP: [name], MD  Admit date: 08/25/2021  Discharge date: 08/30/2021  Admitted From: Home   disposition: Home   Recommendations for Outpatient Follow-up:   Follow up with PCP in 1-2 weeks   Home Health: N/A Equipment/Devices: N/A Consultations: Nephrology, cardiology, neurology Discharge Condition: Improved CODE STATUS: Full Diet Recommendation: Heart Healthy renal  Diet Order             Diet regular Room service appropriate? Yes; Fluid consistency: Thin  Diet effective now           Diet - low sodium heart healthy                    Chief Complaint  Patient presents with   Tachycardia     Brief history of present illness from the day of admission and additional interim summary     Kim Burns is a 86 y.o. female with ESRD on HD MWF at [facility]. Her past medical history significant for HTN, DM, anemia, LVH, bradycardia, IPMN s/p whipple procedure 2018, renal cell carcinoma s/p ablation and embolization per IR on 08/22/21. She presents to the ED c/o chest pain. She was found to have new onset AF/RVR with HR in 180s. Cardiology was consulted. Patient was given ASA and IV Heparin and  was initiated. Patient converted back to SR and she was recently converted to Eliquis BID. Patient underwent left heart catheterization today which showed mild non-obstructive CAD with normal LV systolic function. Bedside 2D ECHO also completed today.                                                                 Hospital Course   After initiation of Cardizem drip, patient had spontaneous conversion to normal sinus rhythm.  She was initially noted to have an elevated troponin however was seen by cardiology and this was thought to  be secondary to demand ischemia with no further work-up necessary.    Course was complicated by apparent abnormalities of vision and possible right-sided weakness.  Patient underwent stroke protocol including MRI which showed punctate foci with restricted diffusion.  Patient was seen by neurology who noted this was extremely small lesions and needed no further work-up.  In addition they noted that vision abnormalities was associated with low blood pressure during hemodialysis.  Patient has been followed by nephrology who have increased her midodrine 10 mg from 3 times weekly prior to HD to daily.    Patient was also noted to have bleeding from her fistula and nephrology arranged for patient to have fistulogram while in house and was found to have 80% stenosis in mid upper arm which was angioplastied.  Patient is discharged home on metoprolol, Eliquis and  increased doses of midodrine.  Vision changes Appreciate neurology consultation--history obtained by them is that she has visual problems during dialysis that are associated with episodes of low blood pressure. Midodrine is increased to 10 mg daily per nephrology (up from 10 mg 3 times weekly on HD days) No further work-up is needed.   Bleeding from fistula Fistulogram showed 80% stenosis in mid upper arm which was angioplastied. Patient was cleared for discharge today per nephrology after angiogram.   A-fib with RVR Spontaneous conversion to sinus rhythm Continue metoprolol for rate control Eliquis for secondary prevention of stroke.   Elevated troponin LHC with nonobstructive CAD NSTEMI thought to be secondary to demand ischemia Patient is intolerant to statins   RCC of left kidney S/p ablation on 7 5 with coil embolization Dr. Pascal Lux cleared anticoagulation/Eliquis   ESRD HD per nephrology Midodrine on HD days   Discharge diagnosis     Principal Problem:   Atrial fibrillation with RVR ([place]) Active Problems:   NSTEMI  (non-ST elevated myocardial infarction) ([place])   End stage renal disease on dialysis [facility])   Renal cell carcinoma of left kidney (H[facility])   IPMN (intraductal papillary mucinous neoplasm)   Diabetes mellitus type 2, controlled ([place])   Pulmonary nodule   Atrial fibrillation (H[facility])   Elevated troponin   Chest pain of uncertain etiology    Discharge instructions    Discharge Instructions     Diet - low sodium heart healthy   Complete by: As directed    Increase activity slowly   Complete by: As directed    No wound care   Complete by: As directed        Discharge Medications   Allergies as of 08/30/2021       Reactions   Gemfibrozil Other (See Comments)   Myalgia    Lotemax [loteprednol Etabonate] Other (See Comments)   burning   Statins Other (See Comments)   Muscle weakness   Doxycycline Other (See Comments)   UNSPECIFIED REACTION    Codeine Other (See Comments)   headache   Hydrocodone-acetaminophen Other (See Comments)   Headache. Tolerates acetaminophen.        Medication List     TAKE these medications    Biotin 10000 MCG Tabs Take 20,000 mcg by mouth in the morning and at bedtime.   colestipol 1 g tablet Commonly known as: COLESTID Take 1 g by mouth 2 (two) times daily as needed (diarrhea).   diclofenac sodium 1 % Gel Commonly known as: VOLTAREN Apply 2 g topically 4 (four) times daily as needed (for hand pain).   escitalopram 20 MG tablet Commonly known as: LEXAPRO Take 20 mg by mouth at bedtime.   lidocaine-prilocaine cream Commonly known as: EMLA Apply 1 Application topically as needed (port access).   Lifitegrast 5 % Soln Place 1 drop into both eyes at bedtime.   lipase/protease/amylase 36000 UNITS Cpep capsule Commonly known as: CREON Take 36,000-72,000 Units by mouth See admin instructions. 72,000 units (3 caps) with each meal and 36,000 units (1 cap) with snacks What changed: Another medication with the same name was added. Make sure  you understand how and when to take each.   lipase/protease/amylase 36000 UNITS Cpep capsule Commonly known as: CREON Take 1 capsule (36,000 Units total) by mouth with snacks. What changed: You were already taking a medication with the same name, and this prescription was added. Make sure you understand how and when to take each.   metoprolol succinate 25 MG  24 hr tablet Commonly known as: TOPROL-XL Take 0.5 tablets (12.5 mg total) by mouth at bedtime.   midodrine 10 MG tablet Commonly known as: PROAMATINE Take 1 tablet (10 mg total) by mouth daily. What changed: when to take this   NovoLOG FlexPen 100 UNIT/ML FlexPen Generic drug: insulin aspart Inject 10 Units into the skin 3 (three) times daily with meals.   oxyCODONE-acetaminophen 5-325 MG tablet Commonly known as: Percocet Take 1 tablet by mouth every 6 (six) hours as needed for severe pain.   PRESERVISION AREDS 2+MULTI VIT PO Take 1 capsule by mouth in the morning and at bedtime.   Propylene Glycol 0.6 % Soln Place 1 drop into both eyes 4 (four) times daily as needed (for dry eyes).   sevelamer carbonate 800 MG tablet Commonly known as: RENVELA Take 1,600 mg by mouth 3 (three) times daily with meals.   Tylenol 8 Hour Arthritis Pain 650 MG CR tablet Generic drug: acetaminophen Take 1,300 mg by mouth at bedtime.         Follow-up Information     [facility]. Schedule an appointment as soon as possible for a visit in 1 month(s).   Specialty: Neurology Why: stroke clinic Contact information: [address] [place] [phone]                Major procedures and Radiology Reports - PLEASE review detailed and final reports thoroughly  -      PERIPHERAL VASCULAR CATHETERIZATION  Result Date: 08/30/2021 OPERATIVE NOTE   PROCEDURE: left brachiobascilic arteriovenous fistula cannulation under ultrasound guidance left arm fistulogram including central  venogram left basilic vein angioplasty in the mid upper arm (6 mm x 40 mm Mustang, 7 mm x 40 mm Mustang, and 8 mm x 40 mm drug-coated Lutonix)  PRE-OPERATIVE DIAGNOSIS: Malfunctioning left brachiobasclic arteriovenous fistula (prolonged bleeding)  POST-OPERATIVE DIAGNOSIS: same as above  SURGEON: Marty Heck, MD  ANESTHESIA: local  ESTIMATED BLOOD LOSS: 5 cc  FINDING(S): No evidence of central venous stenosis.  The left arm basilic vein fistula had about a 80% stenosis focal in the mid upper arm.  After this was crossed with antegrade access it was angioplastied with a 6 mm x 40 mm Mustang, 7 mm x 40 mm Mustang, and 8 mm x 40 mm drug-coated Lutonix.  Less than 30% residual stenosis.  Excellent thrill at completion compared to pulsatile at start of case.  SPECIMEN(S):  None  CONTRAST: 65 mL  INDICATIONS: Kim Burns is a 86 y.o. female who presents with malfunctioning left brachiobasilic arteriovenous fistula with prolonged bleeding.  The patient is scheduled for left arm fistuloogram.  The patient is aware the risks include but are not limited to: bleeding, infection, thrombosis of the cannulated access, and possible anaphylactic reaction to the contrast.  The patient is aware of the risks of the procedure and elects to proceed forward.  DESCRIPTION: After full informed written consent was obtained, the patient was brought back to the angiography suite and placed supine upon the angiography table.  The patient was connected to monitoring equipment.  The left arm was prepped and draped in the standard fashion for a left arm fistulogram.  Under ultrasound guidance, the left brachiobasilic arteriovenous fistula was evaluated with ultrasound, it was patent, an image was saved.  It was cannulated with a micropuncture needle.  The microwire was advanced into the fistula and the needle was exchanged for the microsheath, which was lodged 2 cm  into the access.  The wire was removed and the sheath was connected  to the IV extension tubing.  Hand injections were completed to image the access from the antecubitum up to the level of axilla.  The central venous structures were also imaged by hand injections.  After evaluating images with radiographic interpretation, we elected for intervention.  A Bentson wire was placed in the micro sheath and then we exchanged for a short 7 [place] sheath.  I used a KMP catheter to cross the stenosis in the mid upper arm basilic vein fistula.  We then used a 6 mm x 40 mm Mustang and then 7 mm x 40 mm Mustang to nominal pressure for 2 minutes.  I then exchanged for an 8 mm x 40 mm drug-coated Lutonix to nominal pressure for 3 minutes.  Less than 30% residual stenosis.  Much better thrill.  I did not feel I could be more aggressive.  A 4-0 Monocryl purse-string suture was sewn around the sheath.  The sheath was removed while tying down the suture.  A sterile bandage was applied to the puncture site.  COMPLICATIONS: None  CONDITION: Stable  Marty Heck, MD [facility] Office: [phone]   CT ANGIO HEAD NECK W WO CM  Result Date: 08/29/2021 CLINICAL DATA:  Follow-up examination for stroke. EXAM: CT ANGIOGRAPHY HEAD AND NECK TECHNIQUE: Multidetector CT imaging of the head and neck was performed using the standard protocol during bolus administration of intravenous contrast. Multiplanar CT image reconstructions and MIPs were obtained to evaluate the vascular anatomy. Carotid stenosis measurements (when applicable) are obtained utilizing NASCET criteria, using the distal internal carotid diameter as the denominator. RADIATION DOSE REDUCTION: This exam was performed according to the departmental dose-optimization program which includes automated exposure control, adjustment of the mA and/or kV according to patient size and/or use of iterative reconstruction technique. CONTRAST:  84m OMNIPAQUE IOHEXOL 350 MG/ML SOLN COMPARISON:  MRI from earlier the same  day. FINDINGS: CT HEAD FINDINGS Brain: Generalized age-related cerebral atrophy with mild chronic small vessel ischemic disease. Previously identified punctate ischemic changes not visible by CT. No other acute large vessel territory infarct. No acute intracranial hemorrhage. No mass lesion, mass effect, or midline shift. No hydrocephalus or extra-axial fluid collection. Vascular: No abnormal hyperdense vessel. Calcified atherosclerosis present about the skull base. Skull: Scalp soft tissues and calvarium within normal limits. Sinuses: Scattered mucoperiosteal thickening present about the ethmoidal air cells. Paranasal sinuses are otherwise clear. No significant mastoid effusion. Orbits: Globes orbital soft tissues demonstrate no acute finding. Review of the MIP images confirms the above findings CTA NECK FINDINGS Aortic arch: Visualized aortic arch normal caliber with standard 3 vessel morphology. Mild-to-moderate atheromatous change about the arch itself. No significant stenosis about the origin the great vessels. Right carotid system: Right common and internal carotid arteries are patent without dissection or other acute finding. Eccentric calcified plaque about the right carotid bulb without hemodynamically significant stenosis. Left carotid system: Left common and internal carotid arteries are patent without dissection or other acute finding. Mild calcified plaque about the left carotid bulb without hemodynamically significant stenosis. Vertebral arteries: Both vertebral arteries arise from subclavian arteries. No proximal subclavian artery stenosis. Right vertebral artery dominant. Vertebral arteries patent without stenosis or dissection. Skeleton: No discrete or worrisome osseous lesions. Moderate cervical spondylosis at C3-4 through C6-7. Other neck: No other acute soft tissue abnormality within the neck. Upper chest: 3 mm nodule within the superior segment of the left  lower lobe noted, stable. Scattered  atelectatic changes noted elsewhere within the visualized lungs. Review of the MIP images confirms the above findings CTA HEAD FINDINGS Anterior circulation: Both internal carotid arteries patent to the termini without stenosis. A1 segments patent bilaterally. Normal anterior communicating artery complex. Anterior cerebral arteries patent without stenosis. No M1 stenosis or occlusion. Normal MCA bifurcations. Distal MCA branches perfused and symmetric. Posterior circulation: Both vertebral arteries patent without stenosis. Right vertebral artery dominant. Neither PICA well visualized. Basilar patent to its distal aspect without stenosis. Superior cerebral arteries patent bilaterally. Both PCAs well perfused and patent to their distal aspects. Venous sinuses: Patent allowing for timing the contrast bolus. Anatomic variants: As above.  No aneurysm. Review of the MIP images confirms the above findings IMPRESSION: 1. Negative CTA of the head and neck for large vessel occlusion or other emergent finding. 2. Mild for age atheromatous disease about the aortic arch and carotid bifurcations without hemodynamically significant stenosis. 3. 3 mm left lower lobe nodule, indeterminate. No routine follow-up imaging is recommended per Fleischner Society Guidelines. These guidelines do not apply to immunocompromised patients and patients with cancer. Follow up in patients with significant comorbidities as clinically warranted. For lung cancer screening, adhere to Lung-RADS guidelines. Reference: Radiology. 2017; 284(1):228-43. Electronically Signed   By: Jeannine Boga M.D.   On: 08/29/2021 03:02   MR BRAIN WO CONTRAST  Result Date: 08/28/2021 CLINICAL DATA:  Binocular visual field changes.  Vision loss. EXAM: MRI HEAD WITHOUT CONTRAST TECHNIQUE: Multiplanar, multiecho pulse sequences of the brain and surrounding structures were obtained without intravenous contrast. COMPARISON:  MR head without contrast 03/24/2018  FINDINGS: Brain: Scattered punctate foci of restricted diffusion are noted. A focal lesion is noted along the postcentral gyrus on image 88 of series 5 subcortical punctate focus of restricted diffusion is present in the left parietal lobe on image 83 of series 5 with minimal associated T2 hyperintensity punctate focus is present in the right temporoparietal lobe on image 75 of series 5. A punctate focus is present in the right superior cerebellum on image 62 of series 5. The remote foci of susceptibility most notably along the right splenium of the corpus callosum are stable. Additional lesions are present in the right cerebellum and left frontal lobe. A The internal auditory canals are within normal limits. Other remote lacunar infarcts are present in the cerebellum bilaterally. Brainstem is within normal limits. The ventricles are of normal size. No significant extraaxial fluid collection is present. Vascular: Flow is present in the major intracranial arteries. Skull and upper cervical spine: Degenerative changes of the upper cervical spine have progressed. Craniocervical junction is normal. Marrow signal is normal. Sinuses/Orbits: The paranasal sinuses and mastoid air cells are clear. Bilateral lens replacements are noted. Globes and orbits are otherwise unremarkable. IMPRESSION: 1. Scattered punctate foci of restricted diffusion as described. The finding is nonspecific but can be seen in the setting of acute/subacute ischemia, a demyelinating process such as multiple sclerosis, vasculitis, complicated migraine headaches, or as the sequelae of a prior infectious or inflammatory process. 2. Other remote lacunar infarcts of the cerebellum bilaterally. 3. Progressive degenerative changes of the upper cervical spine. Electronically Signed   By: San Morelle M.D.   On: 08/28/2021 15:20   ECHOCARDIOGRAM COMPLETE  Result Date: 08/27/2021    ECHOCARDIOGRAM REPORT   Patient Name:   Kim Burns Date of  Exam: 08/27/2021 Medical Rec #:  [id number]        Height:  62.0 in Accession #:    [id number]       Weight:       132.0 lb Date of Birth:  Nov 02, 1935        BSA:          1.602 m Patient Age:    57 years         BP:           106/58 mmHg Patient Gender: F                HR:           78 bpm. Exam Location:  Inpatient Procedure: 2D Echo, Cardiac Doppler and Color Doppler Indications:    NSTEMI I21.4  History:        Patient has prior history of Echocardiogram examinations, most                 recent 08/08/2021. Stroke, Arrythmias:Bradycardia; Risk                 Factors:Diabetes, GERD, Hypertension and Sleep Apnea.  Sonographer:    Bernadene Person RDCS Referring Phys: [id number] [place]  1. Right ventricular systolic function is moderately reduced. The right ventricular size is mildly enlarged. There is moderately elevated pulmonary artery systolic pressure. The estimated right ventricular systolic pressure is 68.0 mmHg. Normal apical function with free wall hypokinesis, consistent with McConnell's sign as can be seen in acute PE (image 58). Consider evaluation for PE.  2. Left ventricular ejection fraction, by estimation, is 60 to 65%. The left ventricle has normal function. The left ventricle has no regional wall motion abnormalities. Left ventricular diastolic parameters are consistent with Grade III diastolic dysfunction (restrictive). Elevated left atrial pressure.  3. Left atrial size was severely dilated.  4. Right atrial size was severely dilated.  5. The mitral valve is degenerative. Mild mitral valve regurgitation. No evidence of mitral stenosis. Moderate mitral annular calcification.  6. Tricuspid valve regurgitation is mild to moderate.  7. The aortic valve is tricuspid. Aortic valve regurgitation is not visualized. Aortic valve sclerosis is present, with no evidence of aortic valve stenosis.  8. The inferior vena cava is dilated in size with <50% respiratory variability,  suggesting right atrial pressure of 15 mmHg. FINDINGS  Left Ventricle: Left ventricular ejection fraction, by estimation, is 60 to 65%. The left ventricle has normal function. The left ventricle has no regional wall motion abnormalities. The left ventricular internal cavity size was normal in size. There is  no left ventricular hypertrophy. Left ventricular diastolic parameters are consistent with Grade III diastolic dysfunction (restrictive). Elevated left atrial pressure. Right Ventricle: The right ventricular size is mildly enlarged. No increase in right ventricular wall thickness. Right ventricular systolic function is moderately reduced. There is moderately elevated pulmonary artery systolic pressure. The tricuspid regurgitant velocity is 2.79 m/s, and with an assumed right atrial pressure of 15 mmHg, the estimated right ventricular systolic pressure is 32.1 mmHg. Left Atrium: Left atrial size was severely dilated. Right Atrium: Right atrial size was severely dilated. Pericardium: There is no evidence of pericardial effusion. Mitral Valve: The mitral valve is degenerative in appearance. Moderate mitral annular calcification. Mild mitral valve regurgitation. No evidence of mitral valve stenosis. Tricuspid Valve: The tricuspid valve is normal in structure. Tricuspid valve regurgitation is mild to moderate. Aortic Valve: The aortic valve is tricuspid. Aortic valve regurgitation is not visualized. Aortic valve sclerosis is present, with no evidence of aortic valve stenosis. Pulmonic Valve:  The pulmonic valve was not well visualized. Pulmonic valve regurgitation is not visualized. Aorta: The aortic root and ascending aorta are structurally normal, with no evidence of dilitation. Venous: The inferior vena cava is dilated in size with less than 50% respiratory variability, suggesting right atrial pressure of 15 mmHg. IAS/Shunts: The interatrial septum was not well visualized.  LEFT VENTRICLE PLAX 2D LVIDd:          4.20 cm     Diastology LVIDs:         2.60 cm     LV e' medial:    6.46 cm/s LV PW:         0.80 cm     LV E/e' medial:  17.3 LV IVS:        0.70 cm     LV e' lateral:   8.25 cm/s LVOT diam:     1.90 cm     LV E/e' lateral: 13.6 LV SV:         67 LV SV Index:   42 LVOT Area:     2.84 cm  LV Volumes (MOD) LV vol d, MOD A2C: 52.2 ml LV vol d, MOD A4C: 58.5 ml LV vol s, MOD A2C: 24.0 ml LV vol s, MOD A4C: 19.1 ml LV SV MOD A2C:     28.2 ml LV SV MOD A4C:     58.5 ml LV SV MOD BP:      33.7 ml RIGHT VENTRICLE RV S prime:     9.76 cm/s TAPSE (M-mode): 1.0 cm LEFT ATRIUM             Index        RIGHT ATRIUM           Index LA diam:        4.50 cm 2.81 cm/m   RA Area:     26.80 cm LA Vol (A2C):   87.2 ml 54.42 ml/m  RA Volume:   98.10 ml  61.23 ml/m LA Vol (A4C):   94.0 ml 58.67 ml/m LA Biplane Vol: 91.0 ml 56.80 ml/m  AORTIC VALVE LVOT Vmax:   119.00 cm/s LVOT Vmean:  80.000 cm/s LVOT VTI:    0.235 m  AORTA Ao Root diam: 3.20 cm Ao Asc diam:  3.50 cm MITRAL VALVE                TRICUSPID VALVE MV Area (PHT): 3.91 cm     TR Peak grad:   31.1 mmHg MV Decel Time: 194 msec     TR Vmax:        279.00 cm/s MV E velocity: 112.00 cm/s MV A velocity: 40.30 cm/s   SHUNTS MV E/A ratio:  2.78         Systemic VTI:  0.24 m                             Systemic Diam: 1.90 cm Oswaldo Milian MD Electronically signed by Oswaldo Milian MD Signature Date/Time: 08/27/2021/5:23:14 PM    Final    CARDIAC CATHETERIZATION  Result Date: 08/27/2021   Prox RCA lesion is 20% stenosed.   Mid LM to Dist LM lesion is 20% stenosed.   Ost LAD lesion is 30% stenosed.   Mid Cx lesion is 20% stenosed.   Prox LAD lesion is 50% stenosed.   The left ventricular systolic function is normal.   LV end diastolic pressure is normal.   The left  ventricular ejection fraction is 55-65% by visual estimate.   There is no mitral valve regurgitation. Mild non-obstructive CAD Normal LV systolic function Elevated troponin and chest pain is likely due  to demand ischemic in the setting of rapid atrial fibrillation. Recommendation: Medical management of CAD.   CT CHEST ABDOMEN PELVIS W CONTRAST  Result Date: 08/25/2021 CLINICAL DATA:  History of recent renal ablation with tachycardia, initial encounter EXAM: CT CHEST, ABDOMEN, AND PELVIS WITH CONTRAST TECHNIQUE: Multidetector CT imaging of the chest, abdomen and pelvis was performed following the standard protocol during bolus administration of intravenous contrast. RADIATION DOSE REDUCTION: This exam was performed according to the departmental dose-optimization program which includes automated exposure control, adjustment of the mA and/or kV according to patient size and/or use of iterative reconstruction technique. CONTRAST:  55m OMNIPAQUE IOHEXOL 300 MG/ML  SOLN COMPARISON:  None Available. FINDINGS: CT CHEST FINDINGS Cardiovascular: Scattered atherosclerotic calcifications are noted. No aneurysmal dilatation or dissection is noted. No cardiac enlargement is seen. The pulmonary artery shows a normal branching pattern bilaterally. No filling defect to suggest pulmonary embolism is noted. Scattered coronary calcifications are noted. Mediastinum/Nodes: Thoracic inlet is within normal limits. No hilar or mediastinal adenopathy is noted. The esophagus is within normal limits. Lungs/Pleura: Lungs are well aerated bilaterally. No focal infiltrate or sizable effusion is noted. Minimal scarring is noted in the right base. Tiny less than 4 mm nodule is noted in the superior segment of the left lower lobe. No other nodules are noted. Musculoskeletal: Degenerative changes of the thoracic spine are noted. No rib abnormality is noted. CT ABDOMEN PELVIS FINDINGS Hepatobiliary: No focal liver abnormality is seen. Status post cholecystectomy. No biliary dilatation. Pancreas: Postsurgical changes are noted consistent with prior Whipple. The residual pancreas is atrophic. Spleen: Scattered cysts are noted within the spleen.  Adrenals/Urinary Tract: Adrenal glands are within normal limits. Kidneys demonstrate multiple hypodense lesions throughout both kidneys. A mildly enhancing solid lesion is noted in the medial aspect of the left lower pole. This has undergone recent ablation and coil embolization. No new focal abnormality is noted. No obstructive changes are seen. The bladder is decompressed. Stomach/Bowel: No obstructive or inflammatory changes of the colon are seen. Postsurgical changes are noted consistent with the prior Whipple procedure. The appendix is not well visualized and may have been surgically removed. No inflammatory changes are seen. Visualized small bowel is within normal limits aside from the previously described surgical change. Vascular/Lymphatic: Aortic atherosclerosis. No enlarged abdominal or pelvic lymph nodes. Reproductive: Status post hysterectomy. No adnexal masses. Other: No abdominal wall hernia or abnormality. No abdominopelvic ascites. Musculoskeletal: Degenerative changes of lumbar spine are noted. No acute bony abnormality is noted. IMPRESSION: CT of the chest: No evidence of pulmonary emboli. 3 mm left solid pulmonary nodule. No routine follow-up imaging is recommended per Fleischner Society Guidelines. These guidelines do not apply to immunocompromised patients and patients with cancer. Follow up in patients with significant comorbidities as clinically warranted. For lung cancer screening, adhere to Lung-RADS guidelines. Reference: Radiology. 2017; 284(1):228-43. CT of the abdomen and pelvis: Changes consistent with prior Whipple procedure. Stable solid mass in the medial aspect of the left kidney. This has recently undergone ablation as well as coil embolization. No acute abnormality noted. Electronically Signed   By: MInez CatalinaM.D.   On: 08/25/2021 22:06   CT GUIDE TISSUE ABLATION  Result Date: 08/22/2021 INDICATION: History of renal cell carcinoma. Poor operative candidate. Patient presents  today for image guided left-sided renal microwave ablation  following percutaneous particle and coil embolization performed this morning. Please refer to formal consultation in epic EMR dated 06/04/2021 additional details. EXAM: CT-GUIDED PERCUTANEOUS THERMAL ABLATION OF RENAL LESION ANESTHESIA/SEDATION: General MEDICATIONS: None, antibiotics were administered during the preceding renal arteriogram and percutaneous embolization. CONTRAST:  None COMPARISONS: COMPARISONS CT abdomen and pelvis-05/03/2021 PROCEDURE: The procedure, risks, benefits, and alternatives were explained to the patient. Questions regarding the procedure were encouraged and answered. The patient understands and consents to the procedure. The patient was placed under general anesthesia. Initial unenhanced CT was performed in a prone position to localize the at least 3.5 x 3.1 cm partially exophytic lesion involving the inferior medial aspect of the left kidney (image 67, series 2). The CT gantry table position was marked and lesion was identified sonographically. The procedure was planned. The patient was prepped with chlorhexidine in usual sterile fashion. Two 22 gauge spinal needles were utilized for procedural purposes. Next, under a combination ultrasound and CT guidance, 2 15 gauge microwave ablation probes were positioned bracketing the mid aspect the central left-sided renal lesion. Multiple ultrasound images were saved procedural documentation purposes. Appropriate positioning was confirmed and a 1 minute burn was performed at 90 watts followed by a 4 minute burn at 65 watts with intra procedural imaging obtained at the 3 minute mark of the ablation. Both probes were removed and postprocedural imaging was obtained. Access sites were anesthetized with 1% lidocaine with epinephrine and dressings were applied. The patient tolerated the procedure well without immediate postprocedural complication. COMPLICATIONS: None immediate. FINDINGS:  Noncontrast preprocedural imaging demonstrates an approximally 3.5 x 3.1 cm partially exophytic lesion arising from the inferior medial aspect of the left kidney (image 67, series 2). Contrast is noted within the hypervascular lesion as well as within the adjacent inferior pole of the left kidney following preceding particle and coil embolization. The lesion was identified sonographically and utilizing a combination of CT and ultrasound guidance, 2 probes were placed bracketing the central aspect of the lesion (representative image 17, series 12). The microwave ablation site appears to encompass the entirety of the partially exophytic left-sided renal lesion (series 13). Postprocedural imaging demonstrates expected air about the ablation site without postprocedural complication. Specifically, no pneumothorax or significant hemorrhage about the ablation site. IMPRESSION: Technically successful ultrasound and CT-guided thermal ablation of partially exophytic left-sided renal lesion. PLAN: - The patient will be admitted overnight for continued observation and PCA usage. - The patient will have a telemedicine consultation approximally 4 weeks following today's procedure with initial postprocedural imaging obtained in 3 months (early October 2023). Electronically Signed   By: Sandi Mariscal M.D.   On: 08/22/2021 14:29   IR EMBO TUMOR ORGAN ISCHEMIA INFARCT INC GUIDE ROADMAPPING  Result Date: 08/22/2021 INDICATION: Left-sided renal cell carcinoma. Poor operative candidate. Patient presents today for left-sided renal arteriogram and percutaneous embolization prior to image guided ablation. Please refer to consultation epic EMR dated 06/04/2021 for additional details. EXAM: 1. ULTRASOUND GUIDANCE FOR ARTERIAL ACCESS 2. LEFT RENAL ARTERIOGRAM 3. SUB SELECTIVE ARTERIOGRAM OF THE INFERIOR MEDIAL DIVISION OF THE LEFT RENAL ARTERY AND PERCUTANEOUS PARTICLE AND COIL EMBOLIZATION. 4. SUB SELECTIVE ARTERIOGRAM OF THE INFEROLATERAL  DIVISION OF THE LEFT RENAL ARTERY AND PERCUTANEOUS PARTICLE AND COIL EMBOLIZATION MEDICATIONS: Cefoxitin 2 gm IV. The antibiotic was administered within one hour of the procedure ANESTHESIA/SEDATION: Moderate (conscious) sedation was employed during this procedure as administered by the Interventional Radiology RN. A total of Versed 0.5 mg and Fentanyl 50 mcg was administered intravenously. Moderate Sedation Time:  56 minutes. The patient's level of consciousness and vital signs were monitored continuously by radiology nursing throughout the procedure under my direct supervision. CONTRAST:  120 cc Omnipaque 300 FLUOROSCOPY TIME:  395 mGy COMPLICATIONS: None immediate. PROCEDURE: Informed consent was obtained from the patient following explanation of the procedure, risks, benefits and alternatives. The patient understands, agrees and consents for the procedure. All questions were addressed. A time out was performed prior to the initiation of the procedure. Maximal barrier sterile technique utilized including caps, mask, sterile gowns, sterile gloves, large sterile drape, hand hygiene, and Betadine prep. The right femoral head was marked fluoroscopically. Under sterile conditions and local anesthesia, the right common femoral artery access was performed with a micropuncture needle. Under direct ultrasound guidance, the right common femoral was accessed with a micropuncture kit. An ultrasound image was saved for documentation purposes. This allowed for placement of a 5-French vascular sheath. A limited arteriogram was performed through the side arm of the sheath confirming appropriate access within the right common femoral artery. The 5-French Mickelson catheter was advanced to the level of the thoracic aorta where it was performed, back bled and flushed. The Mickelson catheter was then utilized to select the left renal artery and a left renal arteriogram was performed. Next, with the use of a fathom 14 microwire, a  cantata microcatheter was utilized to select the inferior division the left renal artery and a sub selective arteriogram was performed The microcatheter was then utilized to select the inferolateral division of the left renal artery and a sub selective arteriogram was performed. Next, the vessel was embolized with approximally 1/5 vial of 100-300 micron Embospheres. Once vessel stasis was achieved, the vessel was embolized with overlapping 4 mm and 2 mm interlock coils. The microcatheter was partially retracted and a post embolization arteriogram was performed. The microcatheter was then utilized to select the inferior medial division of the left renal artery and a sub selective arteriogram was performed. Next, the vessel was embolized with approximally 1/5 vial of 103 100 micron Embospheres. Again, once vessel stasis was achieved, the vessel was embolized with overlapping 4 mm and 2 mm interlock coils. The microcatheter was partially retracted and a post embolization arteriogram was performed. The microcatheter was then removed and a completion arteriogram was performed via the [facility] catheter. Images reviewed and the procedure was terminated. All wires, catheters and sheaths were removed the patient. Hemostasis was achieved at the right groin access site with deployment of a Celt closure device. Postprocedural spot fluoroscopic image demonstrates appropriate positioning of the puncture location at the level of the humeral head. A dressing was applied. The patient tolerated the procedure well without immediate post procedural complication. FINDINGS: Selective right renal arteriogram demonstrates a fairly well-defined partially exophytic hypervascular lesion arising from the inferior pole of the left kidney supplied by both the inferolateral and inferior medial divisions of the left renal artery, as was suggested on preceding CTA. Following percutaneous particle and coil embolization of both the inferolateral and  inferior medial divisions of the left renal artery, completion arteriogram demonstrates no residual flow to the hypervascular renal mass with preserved arterial supply to the remainder of the renal parenchyma. IMPRESSION: Technically successful percutaneous particle and coil embolization of both the inferomedial and inferolateral divisions of the left renal artery supplying the known partially exophytic hypervascular left-sided RCC. PLAN: Patient was then escorted to CT for image guided microwave ablation of the left-sided renal mass. Electronically Signed   By: Eldridge Abrahams.D.  On: 08/22/2021 11:22   IR US Guide Vasc Access Right  Result Date: 08/22/2021 INDICATION: Left-sided renal cell carcinoma. Poor operative candidate. Patient presents today for left-sided renal arteriogram and percutaneous embolization prior to image guided ablation. Please refer to consultation epic EMR dated 06/04/2021 for additional details. EXAM: 1. ULTRASOUND GUIDANCE FOR ARTERIAL ACCESS 2. LEFT RENAL ARTERIOGRAM 3. SUB SELECTIVE ARTERIOGRAM OF THE INFERIOR MEDIAL DIVISION OF THE LEFT RENAL ARTERY AND PERCUTANEOUS PARTICLE AND COIL EMBOLIZATION. 4. SUB SELECTIVE ARTERIOGRAM OF THE INFEROLATERAL DIVISION OF THE LEFT RENAL ARTERY AND PERCUTANEOUS PARTICLE AND COIL EMBOLIZATION MEDICATIONS: Cefoxitin 2 gm IV. The antibiotic was administered within one hour of the procedure ANESTHESIA/SEDATION: Moderate (conscious) sedation was employed during this procedure as administered by the Interventional Radiology RN. A total of Versed 0.5 mg and Fentanyl 50 mcg was administered intravenously. Moderate Sedation Time: 56 minutes. The patient's level of consciousness and vital signs were monitored continuously by radiology nursing throughout the procedure under my direct supervision. CONTRAST:  120 cc Omnipaque 300 FLUOROSCOPY TIME:  544 mGy COMPLICATIONS: None immediate. PROCEDURE: Informed consent was obtained from the patient following  explanation of the procedure, risks, benefits and alternatives. The patient understands, agrees and consents for the procedure. All questions were addressed. A time out was performed prior to the initiation of the procedure. Maximal barrier sterile technique utilized including caps, mask, sterile gowns, sterile gloves, large sterile drape, hand hygiene, and Betadine prep. The right femoral head was marked fluoroscopically. Under sterile conditions and local anesthesia, the right common femoral artery access was performed with a micropuncture needle. Under direct ultrasound guidance, the right common femoral was accessed with a micropuncture kit. An ultrasound image was saved for documentation purposes. This allowed for placement of a 5-French vascular sheath. A limited arteriogram was performed through the side arm of the sheath confirming appropriate access within the right common femoral artery. The 5-French Mickelson catheter was advanced to the level of the thoracic aorta where it was performed, back bled and flushed. The Mickelson catheter was then utilized to select the left renal artery and a left renal arteriogram was performed. Next, with the use of a fathom 14 microwire, a cantata microcatheter was utilized to select the inferior division the left renal artery and a sub selective arteriogram was performed The microcatheter was then utilized to select the inferolateral division of the left renal artery and a sub selective arteriogram was performed. Next, the vessel was embolized with approximally 1/5 vial of 100-300 micron Embospheres. Once vessel stasis was achieved, the vessel was embolized with overlapping 4 mm and 2 mm interlock coils. The microcatheter was partially retracted and a post embolization arteriogram was performed. The microcatheter was then utilized to select the inferior medial division of the left renal artery and a sub selective arteriogram was performed. Next, the vessel was embolized  with approximally 1/5 vial of 103 100 micron Embospheres. Again, once vessel stasis was achieved, the vessel was embolized with overlapping 4 mm and 2 mm interlock coils. The microcatheter was partially retracted and a post embolization arteriogram was performed. The microcatheter was then removed and a completion arteriogram was performed via the [facility] catheter. Images reviewed and the procedure was terminated. All wires, catheters and sheaths were removed the patient. Hemostasis was achieved at the right groin access site with deployment of a Celt closure device. Postprocedural spot fluoroscopic image demonstrates appropriate positioning of the puncture location at the level of the humeral head. A dressing was applied. The patient tolerated the  procedure well without immediate post procedural complication. FINDINGS: Selective right renal arteriogram demonstrates a fairly well-defined partially exophytic hypervascular lesion arising from the inferior pole of the left kidney supplied by both the inferolateral and inferior medial divisions of the left renal artery, as was suggested on preceding CTA. Following percutaneous particle and coil embolization of both the inferolateral and inferior medial divisions of the left renal artery, completion arteriogram demonstrates no residual flow to the hypervascular renal mass with preserved arterial supply to the remainder of the renal parenchyma. IMPRESSION: Technically successful percutaneous particle and coil embolization of both the inferomedial and inferolateral divisions of the left renal artery supplying the known partially exophytic hypervascular left-sided RCC. PLAN: Patient was then escorted to CT for image guided microwave ablation of the left-sided renal mass. Electronically Signed   By: Sandi Mariscal M.D.   On: 08/22/2021 11:22   IR Angiogram Renal Uni Selective  Result Date: 08/22/2021 INDICATION: Left-sided renal cell carcinoma. Poor operative candidate.  Patient presents today for left-sided renal arteriogram and percutaneous embolization prior to image guided ablation. Please refer to consultation epic EMR dated 06/04/2021 for additional details. EXAM: 1. ULTRASOUND GUIDANCE FOR ARTERIAL ACCESS 2. LEFT RENAL ARTERIOGRAM 3. SUB SELECTIVE ARTERIOGRAM OF THE INFERIOR MEDIAL DIVISION OF THE LEFT RENAL ARTERY AND PERCUTANEOUS PARTICLE AND COIL EMBOLIZATION. 4. SUB SELECTIVE ARTERIOGRAM OF THE INFEROLATERAL DIVISION OF THE LEFT RENAL ARTERY AND PERCUTANEOUS PARTICLE AND COIL EMBOLIZATION MEDICATIONS: Cefoxitin 2 gm IV. The antibiotic was administered within one hour of the procedure ANESTHESIA/SEDATION: Moderate (conscious) sedation was employed during this procedure as administered by the Interventional Radiology RN. A total of Versed 0.5 mg and Fentanyl 50 mcg was administered intravenously. Moderate Sedation Time: 56 minutes. The patient's level of consciousness and vital signs were monitored continuously by radiology nursing throughout the procedure under my direct supervision. CONTRAST:  120 cc Omnipaque 300 FLUOROSCOPY TIME:  623 mGy COMPLICATIONS: None immediate. PROCEDURE: Informed consent was obtained from the patient following explanation of the procedure, risks, benefits and alternatives. The patient understands, agrees and consents for the procedure. All questions were addressed. A time out was performed prior to the initiation of the procedure. Maximal barrier sterile technique utilized including caps, mask, sterile gowns, sterile gloves, large sterile drape, hand hygiene, and Betadine prep. The right femoral head was marked fluoroscopically. Under sterile conditions and local anesthesia, the right common femoral artery access was performed with a micropuncture needle. Under direct ultrasound guidance, the right common femoral was accessed with a micropuncture kit. An ultrasound image was saved for documentation purposes. This allowed for placement of a  5-French vascular sheath. A limited arteriogram was performed through the side arm of the sheath confirming appropriate access within the right common femoral artery. The 5-French Mickelson catheter was advanced to the level of the thoracic aorta where it was performed, back bled and flushed. The Mickelson catheter was then utilized to select the left renal artery and a left renal arteriogram was performed. Next, with the use of a fathom 14 microwire, a cantata microcatheter was utilized to select the inferior division the left renal artery and a sub selective arteriogram was performed The microcatheter was then utilized to select the inferolateral division of the left renal artery and a sub selective arteriogram was performed. Next, the vessel was embolized with approximally 1/5 vial of 100-300 micron Embospheres. Once vessel stasis was achieved, the vessel was embolized with overlapping 4 mm and 2 mm interlock coils. The microcatheter was partially retracted and a post  embolization arteriogram was performed. The microcatheter was then utilized to select the inferior medial division of the left renal artery and a sub selective arteriogram was performed. Next, the vessel was embolized with approximally 1/5 vial of 103 100 micron Embospheres. Again, once vessel stasis was achieved, the vessel was embolized with overlapping 4 mm and 2 mm interlock coils. The microcatheter was partially retracted and a post embolization arteriogram was performed. The microcatheter was then removed and a completion arteriogram was performed via the [facility] catheter. Images reviewed and the procedure was terminated. All wires, catheters and sheaths were removed the patient. Hemostasis was achieved at the right groin access site with deployment of a Celt closure device. Postprocedural spot fluoroscopic image demonstrates appropriate positioning of the puncture location at the level of the humeral head. A dressing was applied. The  patient tolerated the procedure well without immediate post procedural complication. FINDINGS: Selective right renal arteriogram demonstrates a fairly well-defined partially exophytic hypervascular lesion arising from the inferior pole of the left kidney supplied by both the inferolateral and inferior medial divisions of the left renal artery, as was suggested on preceding CTA. Following percutaneous particle and coil embolization of both the inferolateral and inferior medial divisions of the left renal artery, completion arteriogram demonstrates no residual flow to the hypervascular renal mass with preserved arterial supply to the remainder of the renal parenchyma. IMPRESSION: Technically successful percutaneous particle and coil embolization of both the inferomedial and inferolateral divisions of the left renal artery supplying the known partially exophytic hypervascular left-sided RCC. PLAN: Patient was then escorted to CT for image guided microwave ablation of the left-sided renal mass. Electronically Signed   By: Sandi Mariscal M.D.   On: 08/22/2021 11:22   ECHOCARDIOGRAM COMPLETE  Result Date: 08/08/2021    ECHOCARDIOGRAM REPORT   Patient Name:   Kim Burns Date of Exam: 08/08/2021 Medical Rec #:  [id number]        Height:       64.5 in Accession #:    [id number]       Weight:       127.0 lb Date of Birth:  Feb 27, 1935        BSA:          1.622 m Patient Age:    27 years         BP:           146/70 mmHg Patient Gender: F                HR:           63 bpm. Exam Location:  [place] Procedure: 2D Echo, Cardiac Doppler, Color Doppler and 3D Echo Indications:    Pre-op eval  History:        Patient has prior history of Echocardiogram examinations, most                 recent 06/19/2013. End stage renal disease (HCC)                 Orthostatic hypotension                 Controlled type 2 diabetes mellitus with stage 4 chronic kidney                 disease, unspecified whether long term insulin use ([place].   Sonographer:    [name] RDCS Referring Phys: [id number] PETER M [place] IMPRESSIONS  1. GLS was not performed     .  Left ventricular ejection fraction, by estimation, is 60 to 65%. The left ventricle has normal function. The left ventricle has no regional wall motion abnormalities. There is mild concentric left ventricular hypertrophy. Left ventricular diastolic  parameters are consistent with Grade I diastolic dysfunction (impaired relaxation).  2. Right ventricular systolic function is normal. The right ventricular size is normal.  3. Left atrial size was mildly dilated.  4. The mitral valve is normal in structure. Mild mitral valve regurgitation. No evidence of mitral stenosis. Moderate mitral annular calcification.  5. The aortic valve is tricuspid. Aortic valve regurgitation is not visualized. No aortic stenosis is present.  6. The inferior vena cava is normal in size with greater than 50% respiratory variability, suggesting right atrial pressure of 3 mmHg. FINDINGS  Left Ventricle: GLS was not performed. Left ventricular ejection fraction, by estimation, is 60 to 65%. The left ventricle has normal function. The left ventricle has no regional wall motion abnormalities. The left ventricular internal cavity size was normal in size. There is mild concentric left ventricular hypertrophy. Left ventricular diastolic parameters are consistent with Grade I diastolic dysfunction (impaired relaxation). Indeterminate filling pressures. Right Ventricle: The right ventricular size is normal. No increase in right ventricular wall thickness. Right ventricular systolic function is normal. Left Atrium: Left atrial size was mildly dilated. Right Atrium: Right atrial size was normal in size. Pericardium: There is no evidence of pericardial effusion. Mitral Valve: The mitral valve is normal in structure. Moderate mitral annular calcification. Mild mitral valve regurgitation. No evidence of mitral valve stenosis. Tricuspid Valve: The  tricuspid valve is normal in structure. Tricuspid valve regurgitation is not demonstrated. No evidence of tricuspid stenosis. Aortic Valve: The aortic valve is tricuspid. Aortic valve regurgitation is not visualized. No aortic stenosis is present. Aortic valve mean gradient measures 5.0 mmHg. Aortic valve peak gradient measures 8.6 mmHg. Pulmonic Valve: The pulmonic valve was normal in structure. Pulmonic valve regurgitation is not visualized. No evidence of pulmonic stenosis. Aorta: The aortic root is normal in size and structure, the aortic arch was not well visualized and the ascending aorta was not well visualized. Ascending aorta measurements are within normal limits for age when indexed to body surface area. Venous: The pulmonary veins were not well visualized. The inferior vena cava is normal in size with greater than 50% respiratory variability, suggesting right atrial pressure of 3 mmHg. IAS/Shunts: The interatrial septum appears to be lipomatous. No atrial level shunt detected by color flow Doppler.  LEFT VENTRICLE PLAX 2D LVIDd:         4.50 cm Diastology LVIDs:         2.80 cm LV e' medial:    6.42 cm/s LV PW:         1.00 cm LV E/e' medial:  12.7 LV IVS:        1.10 cm LV e' lateral:   9.90 cm/s                        LV E/e' lateral: 8.3                         3D Volume EF:                        3D EF:        67 %  LV EDV:       92 ml                        LV ESV:       30 ml                        LV SV:        62 ml RIGHT VENTRICLE RV Basal diam:  3.10 cm RV S prime:     11.00 cm/s TAPSE (M-mode): 2.0 cm LEFT ATRIUM             Index        RIGHT ATRIUM           Index LA diam:        4.10 cm 2.53 cm/m   RA Area:     14.60 cm LA Vol (A2C):   61.9 ml 38.17 ml/m  RA Volume:   31.90 ml  19.67 ml/m LA Vol (A4C):   44.0 ml 27.13 ml/m LA Biplane Vol: 56.5 ml 34.84 ml/m  AORTIC VALVE AV Vmax:           147.00 cm/s AV Vmean:          105.000 cm/s AV VTI:            0.320 m AV  Peak Grad:      8.6 mmHg AV Mean Grad:      5.0 mmHg LVOT Vmax:         102.00 cm/s LVOT Vmean:        75.300 cm/s LVOT VTI:          0.227 m LVOT/AV VTI ratio: 0.71  AORTA Ao Root diam: 3.10 cm MITRAL VALVE MV Area (PHT): 2.07 cm    SHUNTS MV Decel Time: 367 msec    Systemic VTI: 0.23 m MV E velocity: 81.80 cm/s MV A velocity: 95.10 cm/s MV E/A ratio:  0.86 Shirlee More MD Electronically signed by Shirlee More MD Signature Date/Time: 08/08/2021/6:07:34 PM    Final     Micro Results    No results found for this or any previous visit (from the past 240 hour(s)).  Today   Subjective    Sheng Pritz feels much improved since admission.  Feels ready to go home.  Denies chest pain, shortness of breath or abdominal pain.  Feels they can take care of themselves with the resources they have at home.  Objective   Blood pressure (!) 103/56, pulse 74, temperature 98.1 F (36.7 C), temperature source Oral, resp. rate 19, height '5\' 2"'  (1.575 m), weight 58.2 kg, SpO2 100 %.   Intake/Output Summary (Last 24 hours) at 08/30/2021 1431 Last data filed at 08/30/2021 1300 Gross per 24 hour  Intake 440 ml  Output 600 ml  Net -160 ml    Exam General: Patient appears well and in good spirits sitting up in bed in no acute distress.  Eyes: sclera anicteric, conjuctiva mild injection bilaterally CVS: S1-S2, regular  Respiratory:  decreased air entry bilaterally secondary to decreased inspiratory effort, rales at bases  GI: NABS, soft, NT  LE: No edema.  Neuro: A/O x 3, Moving all extremities equally with normal strength, CN 3-12 intact, grossly nonfocal.  Psych: patient is logical and coherent, judgement and insight appear normal, mood and affect appropriate to situation.    Data Review   CBC w Diff:  Lab Results  Component Value Date  WBC 9.5 08/30/2021   HGB 8.0 (L) 08/30/2021   HCT 25.6 (L) 08/30/2021   HCT 28.0 (L) 10/05/2016   PLT 306 08/30/2021   LYMPHOPCT 10 08/30/2021   MONOPCT 9  08/30/2021   EOSPCT 1 08/30/2021   BASOPCT 0 08/30/2021    CMP:  Lab Results  Component Value Date   NA 129 (L) 08/30/2021   K 3.6 08/30/2021   CL 91 (L) 08/30/2021   CO2 23 08/30/2021   BUN 40 (H) 08/30/2021   CREATININE 5.12 (H) 08/30/2021   PROT 6.6 08/25/2021   ALBUMIN 2.9 (L) 08/25/2021   BILITOT 0.8 08/25/2021   ALKPHOS 87 08/25/2021   AST 21 08/25/2021   ALT 23 08/25/2021  .   Total Time in preparing paper work, data evaluation and todays exam - 35 minutes  Vashti Hey M.D on 08/30/2021 at 2:31 PM  [facility]

## 2021-08-30 NOTE — Op Note (Signed)
    OPERATIVE NOTE   PROCEDURE: left brachiobascilic arteriovenous fistula cannulation under ultrasound guidance left arm fistulogram including central venogram left basilic vein angioplasty in the mid upper arm (6 mm x 40 mm Mustang, 7 mm x 40 mm Mustang, and 8 mm x 40 mm drug-coated Lutonix)  PRE-OPERATIVE DIAGNOSIS: Malfunctioning left brachiobasclic arteriovenous fistula (prolonged bleeding)  POST-OPERATIVE DIAGNOSIS: same as above   SURGEON: Marty Heck, MD  ANESTHESIA: local  ESTIMATED BLOOD LOSS: 5 cc  FINDING(S): No evidence of central venous stenosis.  The left arm basilic vein fistula had about a 80% stenosis focal in the mid upper arm.  After this was crossed with antegrade access it was angioplastied with a 6 mm x 40 mm Mustang, 7 mm x 40 mm Mustang, and 8 mm x 40 mm drug-coated Lutonix.  Less than 30% residual stenosis.  Excellent thrill at completion compared to pulsatile at start of case.  SPECIMEN(S):  None  CONTRAST: 65 mL  INDICATIONS: Kim Burns is a 86 y.o. female who presents with malfunctioning left brachiobasilic arteriovenous fistula with prolonged bleeding.  The patient is scheduled for left arm fistuloogram.  The patient is aware the risks include but are not limited to: bleeding, infection, thrombosis of the cannulated access, and possible anaphylactic reaction to the contrast.  The patient is aware of the risks of the procedure and elects to proceed forward.  DESCRIPTION: After full informed written consent was obtained, the patient was brought back to the angiography suite and placed supine upon the angiography table.  The patient was connected to monitoring equipment.  The left arm was prepped and draped in the standard fashion for a left arm fistulogram.  Under ultrasound guidance, the left brachiobasilic arteriovenous fistula was evaluated with ultrasound, it was patent, an image was saved.  It was cannulated with a micropuncture needle.   The microwire was advanced into the fistula and the needle was exchanged for the microsheath, which was lodged 2 cm into the access.  The wire was removed and the sheath was connected to the IV extension tubing.  Hand injections were completed to image the access from the antecubitum up to the level of axilla.  The central venous structures were also imaged by hand injections.  After evaluating images with radiographic interpretation, we elected for intervention.  A Bentson wire was placed in the micro sheath and then we exchanged for a short 7 [place] sheath.  I used a KMP catheter to cross the stenosis in the mid upper arm basilic vein fistula.  We then used a 6 mm x 40 mm Mustang and then 7 mm x 40 mm Mustang to nominal pressure for 2 minutes.  I then exchanged for an 8 mm x 40 mm drug-coated Lutonix to nominal pressure for 3 minutes.  Less than 30% residual stenosis.  Much better thrill.  I did not feel I could be more aggressive.  A 4-0 Monocryl purse-string suture was sewn around the sheath.  The sheath was removed while tying down the suture.  A sterile bandage was applied to the puncture site.  COMPLICATIONS: None  CONDITION: Stable  Marty Heck, MD [facility] Office: [place]   08/30/2021 8:24 AM

## 2021-08-30 NOTE — Progress Notes (Signed)
[facility]  Subjective  - prolonged bleeding left arm AVF.   Objective (!) 116/59 65 98.8 F (37.1 C) (Oral) 14 98%  Intake/Output Summary (Last 24 hours) at 08/30/2021 0745 Last data filed at 08/30/2021 0400 Gross per 24 hour  Intake 620 ml  Output 300 ml  Net 320 ml    Left arm 2nd stage basilic vein fistula pulsatile  Laboratory Lab Results: Recent Labs    08/29/21 0209 08/30/21 0304  WBC 6.6 9.5  HGB 7.9* 8.0*  HCT 24.9* 25.6*  PLT 289 306   BMET Recent Labs    08/29/21 0209 08/30/21 0304  NA 133* 129*  K 3.5 3.6  CL 92* 91*  CO2 27 23  GLUCOSE 151* 146*  BUN 34* 40*  CREATININE 3.95* 5.12*  CALCIUM 8.5* 8.4*    COAG Lab Results  Component Value Date   INR 0.9 08/22/2021   INR 1.11 11/12/2016   INR 1.15 04/24/2016   No results found for: "PTT"  Assessment/Planning:  Plan left arm fistulogram with possible intervention for prolonged bleeding.  Risk and benefits discussed.  Marty Heck 08/30/2021 7:45 AM --

## 2021-08-30 NOTE — Progress Notes (Signed)
Advised pt for d/c today. Contacted [place] and spoke to [place]. Clinic advised of pt's d/c and pt to resume care tomorrow. Pt will need to arrive at regular time- arrive at 6:20 for 6:40 chair time.   Melven Sartorius Renal Navigator [phone]

## 2021-08-30 NOTE — Progress Notes (Addendum)
[facility] Progress Note   Subjective:    Seen and examined patient at bedside. S/p Fistulogram today by Dr. Elaina Pattee 80% stenosis in mid upper arm-angioplastied. Plan for discharge today and she can resume HD outpatient per her routine schedule.  Objective Vitals:   08/30/21 0813 08/30/21 0818 08/30/21 0900 08/30/21 1200  BP: 133/63 138/68 115/63 (!) 103/56  Pulse: 69 73 73 74  Resp: (!) '21 19 19 19  '$ Temp:   98.1 F (36.7 C)   TempSrc:   Oral   SpO2: 97% 100% 100%   Weight:      Height:       Physical Exam General: Well-appearing; NAD; on RA Heart: S1 and S2; RRR; No murmurs, gallops, or rubs Lungs: Clear throughout; No wheezing, rales, or rhonchi Abdomen: Soft and non-tender Extremities: No edema BLLE Dialysis Access: S/p fistulogram today; L AVF (+) B/T   Filed Weights   08/28/21 1225 08/29/21 0131 08/30/21 0335  Weight: 56 kg 58.1 kg 58.2 kg    Intake/Output Summary (Last 24 hours) at 08/30/2021 1249 Last data filed at 08/30/2021 0400 Gross per 24 hour  Intake 440 ml  Output 300 ml  Net 140 ml    Additional Objective Labs: Basic Metabolic Panel: Recent Labs  Lab 08/28/21 0457 08/29/21 0209 08/30/21 0304  NA 130* 133* 129*  K 4.4 3.5 3.6  CL 93* 92* 91*  CO2 '22 27 23  '$ GLUCOSE 137* 151* 146*  BUN 63* 34* 40*  CREATININE 5.83* 3.95* 5.12*  CALCIUM 8.3* 8.5* 8.4*  PHOS  --   --  4.0   Liver Function Tests: Recent Labs  Lab 08/25/21 1917  AST 21  ALT 23  ALKPHOS 87  BILITOT 0.8  PROT 6.6  ALBUMIN 2.9*   No results for input(s): "LIPASE", "AMYLASE" in the last 168 hours. CBC: Recent Labs  Lab 08/25/21 1917 08/26/21 0447 08/27/21 0348 08/28/21 0457 08/28/21 1610 08/29/21 0209 08/30/21 0304  WBC 13.1*   < > 8.9 7.8 8.3 6.6 9.5  NEUTROABS 11.3*  --   --   --   --   --  7.5  HGB 10.7*   < > 9.2* 8.7* 8.3* 7.9* 8.0*  HCT 33.2*   < > 29.5* 27.3* 26.3* 24.9* 25.6*  MCV 90.7   < > 92.8 90.1 88.6 90.5 89.5  PLT 219   < > 246  282 292 289 306   < > = values in this interval not displayed.   Blood Culture    Component Value Date/Time   SDES URINE, RANDOM 06/18/2013 1633   SPECREQUEST NONE 06/18/2013 1633   CULT  06/18/2013 1633    INSIGNIFICANT GROWTH Performed at [facility] 06/19/2013 FINAL 06/18/2013 1633    Cardiac Enzymes: No results for input(s): "CKTOTAL", "CKMB", "CKMBINDEX", "TROPONINI" in the last 168 hours. CBG: Recent Labs  Lab 08/29/21 1123 08/29/21 1632 08/29/21 2013 08/30/21 0640 08/30/21 1111  GLUCAP 196* 74 189* 177* 150*   Iron Studies: No results for input(s): "IRON", "TIBC", "TRANSFERRIN", "FERRITIN" in the last 72 hours. Lab Results  Component Value Date   INR 0.9 08/22/2021   INR 1.11 11/12/2016   INR 1.15 04/24/2016   Studies/Results: PERIPHERAL VASCULAR CATHETERIZATION  Result Date: 08/30/2021 OPERATIVE NOTE   PROCEDURE: left brachiobascilic arteriovenous fistula cannulation under ultrasound guidance left arm fistulogram including central venogram left basilic vein angioplasty in the mid upper arm (6 mm x 40 mm Mustang, 7 mm x 40 mm Mustang,  and 8 mm x 40 mm drug-coated Lutonix)  PRE-OPERATIVE DIAGNOSIS: Malfunctioning left brachiobasclic arteriovenous fistula (prolonged bleeding)  POST-OPERATIVE DIAGNOSIS: same as above  SURGEON: Marty Heck, MD  ANESTHESIA: local  ESTIMATED BLOOD LOSS: 5 cc  FINDING(S): No evidence of central venous stenosis.  The left arm basilic vein fistula had about a 80% stenosis focal in the mid upper arm.  After this was crossed with antegrade access it was angioplastied with a 6 mm x 40 mm Mustang, 7 mm x 40 mm Mustang, and 8 mm x 40 mm drug-coated Lutonix.  Less than 30% residual stenosis.  Excellent thrill at completion compared to pulsatile at start of case.  SPECIMEN(S):  None  CONTRAST: 65 mL  INDICATIONS: SYONA WROBLEWSKI is a 86 y.o. female who presents with malfunctioning left brachiobasilic arteriovenous fistula  with prolonged bleeding.  The patient is scheduled for left arm fistuloogram.  The patient is aware the risks include but are not limited to: bleeding, infection, thrombosis of the cannulated access, and possible anaphylactic reaction to the contrast.  The patient is aware of the risks of the procedure and elects to proceed forward.  DESCRIPTION: After full informed written consent was obtained, the patient was brought back to the angiography suite and placed supine upon the angiography table.  The patient was connected to monitoring equipment.  The left arm was prepped and draped in the standard fashion for a left arm fistulogram.  Under ultrasound guidance, the left brachiobasilic arteriovenous fistula was evaluated with ultrasound, it was patent, an image was saved.  It was cannulated with a micropuncture needle.  The microwire was advanced into the fistula and the needle was exchanged for the microsheath, which was lodged 2 cm into the access.  The wire was removed and the sheath was connected to the IV extension tubing.  Hand injections were completed to image the access from the antecubitum up to the level of axilla.  The central venous structures were also imaged by hand injections.  After evaluating images with radiographic interpretation, we elected for intervention.  A Bentson wire was placed in the micro sheath and then we exchanged for a short 7 [place] sheath.  I used a KMP catheter to cross the stenosis in the mid upper arm basilic vein fistula.  We then used a 6 mm x 40 mm Mustang and then 7 mm x 40 mm Mustang to nominal pressure for 2 minutes.  I then exchanged for an 8 mm x 40 mm drug-coated Lutonix to nominal pressure for 3 minutes.  Less than 30% residual stenosis.  Much better thrill.  I did not feel I could be more aggressive.  A 4-0 Monocryl purse-string suture was sewn around the sheath.  The sheath was removed while tying down the suture.  A sterile bandage was applied to the puncture site.   COMPLICATIONS: None  CONDITION: Stable  Marty Heck, MD [facility] Office: [phone]   CT ANGIO HEAD NECK W WO CM  Result Date: 08/29/2021 CLINICAL DATA:  Follow-up examination for stroke. EXAM: CT ANGIOGRAPHY HEAD AND NECK TECHNIQUE: Multidetector CT imaging of the head and neck was performed using the standard protocol during bolus administration of intravenous contrast. Multiplanar CT image reconstructions and MIPs were obtained to evaluate the vascular anatomy. Carotid stenosis measurements (when applicable) are obtained utilizing NASCET criteria, using the distal internal carotid diameter as the denominator. RADIATION DOSE REDUCTION: This exam was performed according to the departmental dose-optimization program  which includes automated exposure control, adjustment of the mA and/or kV according to patient size and/or use of iterative reconstruction technique. CONTRAST:  87m OMNIPAQUE IOHEXOL 350 MG/ML SOLN COMPARISON:  MRI from earlier the same day. FINDINGS: CT HEAD FINDINGS Brain: Generalized age-related cerebral atrophy with mild chronic small vessel ischemic disease. Previously identified punctate ischemic changes not visible by CT. No other acute large vessel territory infarct. No acute intracranial hemorrhage. No mass lesion, mass effect, or midline shift. No hydrocephalus or extra-axial fluid collection. Vascular: No abnormal hyperdense vessel. Calcified atherosclerosis present about the skull base. Skull: Scalp soft tissues and calvarium within normal limits. Sinuses: Scattered mucoperiosteal thickening present about the ethmoidal air cells. Paranasal sinuses are otherwise clear. No significant mastoid effusion. Orbits: Globes orbital soft tissues demonstrate no acute finding. Review of the MIP images confirms the above findings CTA NECK FINDINGS Aortic arch: Visualized aortic arch normal caliber with standard 3 vessel morphology. Mild-to-moderate  atheromatous change about the arch itself. No significant stenosis about the origin the great vessels. Right carotid system: Right common and internal carotid arteries are patent without dissection or other acute finding. Eccentric calcified plaque about the right carotid bulb without hemodynamically significant stenosis. Left carotid system: Left common and internal carotid arteries are patent without dissection or other acute finding. Mild calcified plaque about the left carotid bulb without hemodynamically significant stenosis. Vertebral arteries: Both vertebral arteries arise from subclavian arteries. No proximal subclavian artery stenosis. Right vertebral artery dominant. Vertebral arteries patent without stenosis or dissection. Skeleton: No discrete or worrisome osseous lesions. Moderate cervical spondylosis at C3-4 through C6-7. Other neck: No other acute soft tissue abnormality within the neck. Upper chest: 3 mm nodule within the superior segment of the left lower lobe noted, stable. Scattered atelectatic changes noted elsewhere within the visualized lungs. Review of the MIP images confirms the above findings CTA HEAD FINDINGS Anterior circulation: Both internal carotid arteries patent to the termini without stenosis. A1 segments patent bilaterally. Normal anterior communicating artery complex. Anterior cerebral arteries patent without stenosis. No M1 stenosis or occlusion. Normal MCA bifurcations. Distal MCA branches perfused and symmetric. Posterior circulation: Both vertebral arteries patent without stenosis. Right vertebral artery dominant. Neither PICA well visualized. Basilar patent to its distal aspect without stenosis. Superior cerebral arteries patent bilaterally. Both PCAs well perfused and patent to their distal aspects. Venous sinuses: Patent allowing for timing the contrast bolus. Anatomic variants: As above.  No aneurysm. Review of the MIP images confirms the above findings IMPRESSION: 1.  Negative CTA of the head and neck for large vessel occlusion or other emergent finding. 2. Mild for age atheromatous disease about the aortic arch and carotid bifurcations without hemodynamically significant stenosis. 3. 3 mm left lower lobe nodule, indeterminate. No routine follow-up imaging is recommended per Fleischner Society Guidelines. These guidelines do not apply to immunocompromised patients and patients with cancer. Follow up in patients with significant comorbidities as clinically warranted. For lung cancer screening, adhere to Lung-RADS guidelines. Reference: Radiology. 2017; 284(1):228-43. Electronically Signed   By: BJeannine BogaM.D.   On: 08/29/2021 03:02   MR BRAIN WO CONTRAST  Result Date: 08/28/2021 CLINICAL DATA:  Binocular visual field changes.  Vision loss. EXAM: MRI HEAD WITHOUT CONTRAST TECHNIQUE: Multiplanar, multiecho pulse sequences of the brain and surrounding structures were obtained without intravenous contrast. COMPARISON:  MR head without contrast 03/24/2018 FINDINGS: Brain: Scattered punctate foci of restricted diffusion are noted. A focal lesion is noted along the postcentral gyrus on image 88 of series  5 subcortical punctate focus of restricted diffusion is present in the left parietal lobe on image 83 of series 5 with minimal associated T2 hyperintensity punctate focus is present in the right temporoparietal lobe on image 75 of series 5. A punctate focus is present in the right superior cerebellum on image 62 of series 5. The remote foci of susceptibility most notably along the right splenium of the corpus callosum are stable. Additional lesions are present in the right cerebellum and left frontal lobe. A The internal auditory canals are within normal limits. Other remote lacunar infarcts are present in the cerebellum bilaterally. Brainstem is within normal limits. The ventricles are of normal size. No significant extraaxial fluid collection is present. Vascular: Flow  is present in the major intracranial arteries. Skull and upper cervical spine: Degenerative changes of the upper cervical spine have progressed. Craniocervical junction is normal. Marrow signal is normal. Sinuses/Orbits: The paranasal sinuses and mastoid air cells are clear. Bilateral lens replacements are noted. Globes and orbits are otherwise unremarkable. IMPRESSION: 1. Scattered punctate foci of restricted diffusion as described. The finding is nonspecific but can be seen in the setting of acute/subacute ischemia, a demyelinating process such as multiple sclerosis, vasculitis, complicated migraine headaches, or as the sequelae of a prior infectious or inflammatory process. 2. Other remote lacunar infarcts of the cerebellum bilaterally. 3. Progressive degenerative changes of the upper cervical spine. Electronically Signed   By: San Morelle M.D.   On: 08/28/2021 15:20    Medications:  sodium chloride Stopped (08/27/21 0654)   sodium chloride     ferric gluconate (FERRLECIT) IVPB      Chlorhexidine Gluconate Cloth  6 each Topical Q0600   [START ON 09/04/2021] darbepoetin (ARANESP) injection - DIALYSIS  60 mcg Intravenous Q Tue-HD   escitalopram  20 mg Oral QHS   insulin aspart  0-9 Units Subcutaneous TID WC   insulin aspart  4 Units Subcutaneous TID WC   lipase/protease/amylase  36,000 Units Oral With snacks   lipase/protease/amylase  72,000 Units Oral TID WC   metoprolol succinate  12.5 mg Oral QHS   midodrine  10 mg Oral Q M,W,F-HD   multivitamin  1 tablet Oral QHS   polyethylene glycol  17 g Oral Daily   sevelamer carbonate  1,600 mg Oral TID WC   sodium chloride flush  3 mL Intravenous Q12H    Dialysis Orders: MWF- [facility] [id number], BFR 350, DFR Auto 1.5,  EDW 57kg, 2K/ 2Ca Access: L AVF  Heparin 2000 units bolus with HD Mircera 75 mcg q4wks - new order has not been started yet Calcitriol 11m PO qHD- last 08/25/21  Assessment/Plan: NSTEMI-Cardiology  following, s/p LHC today which showed non-obstructive CAD, intolerant to statin, now on Eliquis BID Afib with RVR-Continue Metoprolol and Eliquis ESRD - on HD MWF. S/p Fistulogram by Dr. ClCarlis Abbottor ongoing bleeding at L AVF. Found 80% stenosis mid-upper arm-angioplasty was performed. Plan to resume HD in outpatient tomorrow per her routine schedule. Hypertension/volume- BP currently stable and euvolemic on exam. Patient on Midodrine '10mg'$  on HD days for hypotension. Also on Toprol 12.'5mg'$  daily for rate control. She reports still feeling that her blood pressures are dropping at home. Reluctant to adjust Toprol as this time d/t her going in/out Afib with RVR. Discussed with Dr. ScJonnie FinnerRecommend increasing Midodrine to '10mg'$  daily to see if this helps with her BP. Continue to monitor her BP trend while on HD.  Anemia of CKD -Hgb now 8.0, ESA and Fe  recently given with HD. Will resume Fe load and ESA in outpatient. Secondary Hyperparathyroidism - PO4 and Ca ok. Nutrition - Renal diet with fluid restriction Dispo-okay for discharge from renal standpoint; however, family is requesting for patient to get one more treatment here to ensure her AVF is working properly. Plan for her to receive HD here overnight tonight. She can then be discharged tomorrow 7/14 in AM. She will then receive a make-up HD session this Saturday, and resume her MWF schedule next week. Family and primary medical team have been updated on new plan.  Tobie Poet, NP [facility] 08/30/2021,12:49 PM  LOS: 4 days    Pt seen, examined and agree w A/P as above.  Kelly Splinter  MD 08/31/2021, 7:49 AM

## 2021-08-31 ENCOUNTER — Encounter (HOSPITAL_COMMUNITY): Payer: Self-pay | Admitting: Vascular Surgery

## 2021-08-31 LAB — CBC
HCT: 24.1 % — ABNORMAL LOW (ref 36.0–46.0)
Hemoglobin: 8 g/dL — ABNORMAL LOW (ref 12.0–15.0)
MCH: 29 pg (ref 26.0–34.0)
MCHC: 33.2 g/dL (ref 30.0–36.0)
MCV: 87.3 fL (ref 80.0–100.0)
Platelets: 351 10*3/uL (ref 150–400)
RBC: 2.76 MIL/uL — ABNORMAL LOW (ref 3.87–5.11)
RDW: 15.7 % — ABNORMAL HIGH (ref 11.5–15.5)
WBC: 10.5 10*3/uL (ref 4.0–10.5)
nRBC: 0 % (ref 0.0–0.2)

## 2021-08-31 LAB — GLUCOSE, CAPILLARY
Glucose-Capillary: 153 mg/dL — ABNORMAL HIGH (ref 70–99)
Glucose-Capillary: 166 mg/dL — ABNORMAL HIGH (ref 70–99)

## 2021-08-31 MED FILL — Heparin Sod (Porcine)-NaCl IV Soln 1000 Unit/500ML-0.9%: INTRAVENOUS | Qty: 500 | Status: AC

## 2021-08-31 NOTE — Care Management Important Message (Signed)
Important Message  Patient Details  Name: Kim Burns MRN: [id number] Date of Birth: [birth year]   Medicare Important Message Given:  Yes     Shelda Altes 08/31/2021, 9:11 AM

## 2021-08-31 NOTE — Progress Notes (Signed)
Received patient in bed, alert and oriented. Informed consent signed and in chart.  Pre-tx VS: T98.2-122/62-71-19-96%RA  Time tx initiated: 2148 Time tx completed: 0143  HD treatment completed. Patient tolerated well. Vitals stable during treatment. AVF without signs and symptoms of complications. Bleeding time 36mns.   Patient transported back to the room, alert and oriented, in no acute distress. Report given to bedside RN.  Total UF removed: 1.5L  No meds were given during HD tx  Post HD VS: T98-133/53-74-18-99% RA  Post HD weight: 57.3kg

## 2021-08-31 NOTE — Progress Notes (Signed)
Received post dialysis report from [place]. Patient transferred back to 3E16. VSS. Denies any discomfort. Bed in low position, alarm active and audible

## 2021-08-31 NOTE — Discharge Summary (Signed)
Kim Burns GDJ:[id number] DOB: 12/14/1935 DOA: 08/25/2021  PCP: [name], MD  Admit date: 08/25/2021  Discharge date: 08/31/2021  Admitted From: Home   disposition: Home Recommendations for Outpatient Follow-up:   Follow up with PCP in 1-2 weeks   Home Health: N/A Equipment/Devices: N/A Consultations: Nephrology, cardiology, neurology Discharge Condition: Improved CODE STATUS: Full Diet Recommendation: Heart Healthy renal  Diet Order             Diet regular Room service appropriate? Yes; Fluid consistency: Thin  Diet effective now           Diet - low sodium heart healthy                    Chief Complaint  Patient presents with   Tachycardia     Brief history of present illness from the day of admission and additional interim summary     Kim Burns is a 86 y.o. female with ESRD on HD MWF at [facility]. Her past medical history significant for HTN, DM, anemia, LVH, bradycardia, IPMN s/p whipple procedure 2018, renal cell carcinoma s/p ablation and embolization per IR on 08/22/21. She presents to the ED c/o chest pain. She was found to have new onset AF/RVR with HR in 180s. Cardiology was consulted. Patient was given ASA and IV Heparin and  was initiated. Patient converted back to SR and she was recently converted to Eliquis BID. Patient underwent left heart catheterization today which showed mild non-obstructive CAD with normal LV systolic function. Bedside 2D ECHO also completed today.                                                                 Hospital Course   After initiation of Cardizem drip, patient had spontaneous conversion to normal sinus rhythm.  She was initially noted to have an elevated troponin however was seen by cardiology and this was thought to be  secondary to demand ischemia with no further work-up necessary.    Course was complicated by apparent abnormalities of vision and possible right-sided weakness.  Patient underwent stroke protocol including MRI which showed punctate foci with restricted diffusion.  Patient was seen by neurology who noted this was extremely small lesions and needed no further work-up.  In addition they noted that vision abnormalities was associated with low blood pressure during hemodialysis.  Patient has been followed by nephrology who have increased her midodrine 10 mg from 3 times weekly prior to HD to daily.    Patient was also noted to have bleeding from her fistula and nephrology arranged for patient to have fistulogram while in house and was found to have 80% stenosis in mid upper arm which was angioplastied.  Patient is discharged home on metoprolol, Eliquis and increased doses  of midodrine.  Vision changes Appreciate neurology consultation--history obtained by them is that she has visual problems during dialysis that are associated with episodes of low blood pressure. Midodrine is increased to 10 mg daily per nephrology (up from 10 mg 3 times weekly on HD days) No further work-up is needed.   Bleeding from fistula Fistulogram showed 80% stenosis in mid upper arm which was angioplastied. Patient was cleared for discharge today per nephrology after angiogram.   A-fib with RVR Spontaneous conversion to sinus rhythm Continue metoprolol for rate control Eliquis for secondary prevention of stroke.   Elevated troponin LHC with nonobstructive CAD NSTEMI thought to be secondary to demand ischemia Patient is intolerant to statins   RCC of left kidney S/p ablation on 7 5 with coil embolization Dr. Pascal Lux cleared anticoagulation/Eliquis   ESRD HD per nephrology Midodrine on HD days   Discharge diagnosis     Principal Problem:   Atrial fibrillation with RVR ([place]) Active Problems:   NSTEMI (non-ST  elevated myocardial infarction) ([place])   End stage renal disease on dialysis [facility])   Renal cell carcinoma of left kidney (HCC)   IPMN (intraductal papillary mucinous neoplasm)   Diabetes mellitus type 2, controlled ([place])   Pulmonary nodule   Atrial fibrillation (HCC)   Elevated troponin   Chest pain of uncertain etiology    Discharge instructions    Discharge Instructions     Diet - low sodium heart healthy   Complete by: As directed    Discharge patient   Complete by: As directed    Discharge disposition: 01-Home or Self Care   Discharge patient date: 08/31/2021   Increase activity slowly   Complete by: As directed    No wound care   Complete by: As directed        Discharge Medications   Allergies as of 08/31/2021       Reactions   Gemfibrozil Other (See Comments)   Myalgia    Lotemax [loteprednol Etabonate] Other (See Comments)   burning   Statins Other (See Comments)   Muscle weakness   Doxycycline Other (See Comments)   UNSPECIFIED REACTION    Codeine Other (See Comments)   headache   Hydrocodone-acetaminophen Other (See Comments)   Headache. Tolerates acetaminophen.        Medication List     TAKE these medications    Biotin 10000 MCG Tabs Take 20,000 mcg by mouth in the morning and at bedtime.   colestipol 1 g tablet Commonly known as: COLESTID Take 1 g by mouth 2 (two) times daily as needed (diarrhea).   diclofenac sodium 1 % Gel Commonly known as: VOLTAREN Apply 2 g topically 4 (four) times daily as needed (for hand pain).   Eliquis 2.5 MG Tabs tablet Generic drug: apixaban Take 1 tablet (2.5 mg total) by mouth 2 (two) times daily.   escitalopram 20 MG tablet Commonly known as: LEXAPRO Take 20 mg by mouth at bedtime.   lidocaine-prilocaine cream Commonly known as: EMLA Apply 1 Application topically as needed (port access).   Lifitegrast 5 % Soln Place 1 drop into both eyes at bedtime.   lipase/protease/amylase 36000 UNITS Cpep  capsule Commonly known as: CREON Take 36,000-72,000 Units by mouth See admin instructions. 72,000 units (3 caps) with each meal and 36,000 units (1 cap) with snacks What changed: Another medication with the same name was added. Make sure you understand how and when to take each.   lipase/protease/amylase 36000 UNITS Cpep capsule Commonly known  as: CREON Take 1 capsule (36,000 Units total) by mouth with snacks. What changed: You were already taking a medication with the same name, and this prescription was added. Make sure you understand how and when to take each.   metoprolol succinate 25 MG 24 hr tablet Commonly known as: TOPROL-XL Take 0.5 tablets (12.5 mg total) by mouth at bedtime.   midodrine 10 MG tablet Commonly known as: PROAMATINE Take 1 tablet (10 mg total) by mouth daily. What changed: when to take this   NovoLOG FlexPen 100 UNIT/ML FlexPen Generic drug: insulin aspart Inject 10 Units into the skin 3 (three) times daily with meals.   oxyCODONE-acetaminophen 5-325 MG tablet Commonly known as: Percocet Take 1 tablet by mouth every 6 (six) hours as needed for severe pain.   PRESERVISION AREDS 2+MULTI VIT PO Take 1 capsule by mouth in the morning and at bedtime.   Propylene Glycol 0.6 % Soln Place 1 drop into both eyes 4 (four) times daily as needed (for dry eyes).   sevelamer carbonate 800 MG tablet Commonly known as: RENVELA Take 1,600 mg by mouth 3 (three) times daily with meals.   Tylenol 8 Hour Arthritis Pain 650 MG CR tablet Generic drug: acetaminophen Take 1,300 mg by mouth at bedtime.         Follow-up Information     [facility]. Schedule an appointment as soon as possible for a visit in 1 month(s).   Specialty: Neurology Why: stroke clinic Contact information: [place] [phone]        [name], MD Follow up.   Specialty: Family Medicine Contact  information: [address] [place] [address] [phone]         [place], Peter M, MD .   Specialty: Cardiology Contact information: [address] [place] [address] [phone]                 Major procedures and Radiology Reports - PLEASE review detailed and final reports thoroughly  -      PERIPHERAL VASCULAR CATHETERIZATION  Result Date: 08/30/2021 OPERATIVE NOTE   PROCEDURE: left brachiobascilic arteriovenous fistula cannulation under ultrasound guidance left arm fistulogram including central venogram left basilic vein angioplasty in the mid upper arm (6 mm x 40 mm Mustang, 7 mm x 40 mm Mustang, and 8 mm x 40 mm drug-coated Lutonix)  PRE-OPERATIVE DIAGNOSIS: Malfunctioning left brachiobasclic arteriovenous fistula (prolonged bleeding)  POST-OPERATIVE DIAGNOSIS: same as above  SURGEON: Marty Heck, MD  ANESTHESIA: local  ESTIMATED BLOOD LOSS: 5 cc  FINDING(S): No evidence of central venous stenosis.  The left arm basilic vein fistula had about a 80% stenosis focal in the mid upper arm.  After this was crossed with antegrade access it was angioplastied with a 6 mm x 40 mm Mustang, 7 mm x 40 mm Mustang, and 8 mm x 40 mm drug-coated Lutonix.  Less than 30% residual stenosis.  Excellent thrill at completion compared to pulsatile at start of case.  SPECIMEN(S):  None  CONTRAST: 65 mL  INDICATIONS: JAMIRAH ZELAYA is a 86 y.o. female who presents with malfunctioning left brachiobasilic arteriovenous fistula with prolonged bleeding.  The patient is scheduled for left arm fistuloogram.  The patient is aware the risks include but are not limited to: bleeding, infection, thrombosis of the cannulated access, and possible anaphylactic reaction to the contrast.  The patient is aware of the risks of the procedure and elects to  proceed forward.  DESCRIPTION: After full informed written consent was obtained, the patient was brought back to the angiography suite and  placed supine upon the angiography table.  The patient was connected to monitoring equipment.  The left arm was prepped and draped in the standard fashion for a left arm fistulogram.  Under ultrasound guidance, the left brachiobasilic arteriovenous fistula was evaluated with ultrasound, it was patent, an image was saved.  It was cannulated with a micropuncture needle.  The microwire was advanced into the fistula and the needle was exchanged for the microsheath, which was lodged 2 cm into the access.  The wire was removed and the sheath was connected to the IV extension tubing.  Hand injections were completed to image the access from the antecubitum up to the level of axilla.  The central venous structures were also imaged by hand injections.  After evaluating images with radiographic interpretation, we elected for intervention.  A Bentson wire was placed in the micro sheath and then we exchanged for a short 7 [place] sheath.  I used a KMP catheter to cross the stenosis in the mid upper arm basilic vein fistula.  We then used a 6 mm x 40 mm Mustang and then 7 mm x 40 mm Mustang to nominal pressure for 2 minutes.  I then exchanged for an 8 mm x 40 mm drug-coated Lutonix to nominal pressure for 3 minutes.  Less than 30% residual stenosis.  Much better thrill.  I did not feel I could be more aggressive.  A 4-0 Monocryl purse-string suture was sewn around the sheath.  The sheath was removed while tying down the suture.  A sterile bandage was applied to the puncture site.  COMPLICATIONS: None  CONDITION: Stable  Marty Heck, MD [facility] Office: [phone]   CT ANGIO HEAD NECK W WO CM  Result Date: 08/29/2021 CLINICAL DATA:  Follow-up examination for stroke. EXAM: CT ANGIOGRAPHY HEAD AND NECK TECHNIQUE: Multidetector CT imaging of the head and neck was performed using the standard protocol during bolus administration of intravenous contrast. Multiplanar CT image  reconstructions and MIPs were obtained to evaluate the vascular anatomy. Carotid stenosis measurements (when applicable) are obtained utilizing NASCET criteria, using the distal internal carotid diameter as the denominator. RADIATION DOSE REDUCTION: This exam was performed according to the departmental dose-optimization program which includes automated exposure control, adjustment of the mA and/or kV according to patient size and/or use of iterative reconstruction technique. CONTRAST:  68m OMNIPAQUE IOHEXOL 350 MG/ML SOLN COMPARISON:  MRI from earlier the same day. FINDINGS: CT HEAD FINDINGS Brain: Generalized age-related cerebral atrophy with mild chronic small vessel ischemic disease. Previously identified punctate ischemic changes not visible by CT. No other acute large vessel territory infarct. No acute intracranial hemorrhage. No mass lesion, mass effect, or midline shift. No hydrocephalus or extra-axial fluid collection. Vascular: No abnormal hyperdense vessel. Calcified atherosclerosis present about the skull base. Skull: Scalp soft tissues and calvarium within normal limits. Sinuses: Scattered mucoperiosteal thickening present about the ethmoidal air cells. Paranasal sinuses are otherwise clear. No significant mastoid effusion. Orbits: Globes orbital soft tissues demonstrate no acute finding. Review of the MIP images confirms the above findings CTA NECK FINDINGS Aortic arch: Visualized aortic arch normal caliber with standard 3 vessel morphology. Mild-to-moderate atheromatous change about the arch itself. No significant stenosis about the origin the great vessels. Right carotid system: Right common and internal carotid arteries are patent without dissection or other acute finding. Eccentric calcified  plaque about the right carotid bulb without hemodynamically significant stenosis. Left carotid system: Left common and internal carotid arteries are patent without dissection or other acute finding. Mild  calcified plaque about the left carotid bulb without hemodynamically significant stenosis. Vertebral arteries: Both vertebral arteries arise from subclavian arteries. No proximal subclavian artery stenosis. Right vertebral artery dominant. Vertebral arteries patent without stenosis or dissection. Skeleton: No discrete or worrisome osseous lesions. Moderate cervical spondylosis at C3-4 through C6-7. Other neck: No other acute soft tissue abnormality within the neck. Upper chest: 3 mm nodule within the superior segment of the left lower lobe noted, stable. Scattered atelectatic changes noted elsewhere within the visualized lungs. Review of the MIP images confirms the above findings CTA HEAD FINDINGS Anterior circulation: Both internal carotid arteries patent to the termini without stenosis. A1 segments patent bilaterally. Normal anterior communicating artery complex. Anterior cerebral arteries patent without stenosis. No M1 stenosis or occlusion. Normal MCA bifurcations. Distal MCA branches perfused and symmetric. Posterior circulation: Both vertebral arteries patent without stenosis. Right vertebral artery dominant. Neither PICA well visualized. Basilar patent to its distal aspect without stenosis. Superior cerebral arteries patent bilaterally. Both PCAs well perfused and patent to their distal aspects. Venous sinuses: Patent allowing for timing the contrast bolus. Anatomic variants: As above.  No aneurysm. Review of the MIP images confirms the above findings IMPRESSION: 1. Negative CTA of the head and neck for large vessel occlusion or other emergent finding. 2. Mild for age atheromatous disease about the aortic arch and carotid bifurcations without hemodynamically significant stenosis. 3. 3 mm left lower lobe nodule, indeterminate. No routine follow-up imaging is recommended per Fleischner Society Guidelines. These guidelines do not apply to immunocompromised patients and patients with cancer. Follow up in patients  with significant comorbidities as clinically warranted. For lung cancer screening, adhere to Lung-RADS guidelines. Reference: Radiology. 2017; 284(1):228-43. Electronically Signed   By: Jeannine Boga M.D.   On: 08/29/2021 03:02   MR BRAIN WO CONTRAST  Result Date: 08/28/2021 CLINICAL DATA:  Binocular visual field changes.  Vision loss. EXAM: MRI HEAD WITHOUT CONTRAST TECHNIQUE: Multiplanar, multiecho pulse sequences of the brain and surrounding structures were obtained without intravenous contrast. COMPARISON:  MR head without contrast 03/24/2018 FINDINGS: Brain: Scattered punctate foci of restricted diffusion are noted. A focal lesion is noted along the postcentral gyrus on image 88 of series 5 subcortical punctate focus of restricted diffusion is present in the left parietal lobe on image 83 of series 5 with minimal associated T2 hyperintensity punctate focus is present in the right temporoparietal lobe on image 75 of series 5. A punctate focus is present in the right superior cerebellum on image 62 of series 5. The remote foci of susceptibility most notably along the right splenium of the corpus callosum are stable. Additional lesions are present in the right cerebellum and left frontal lobe. A The internal auditory canals are within normal limits. Other remote lacunar infarcts are present in the cerebellum bilaterally. Brainstem is within normal limits. The ventricles are of normal size. No significant extraaxial fluid collection is present. Vascular: Flow is present in the major intracranial arteries. Skull and upper cervical spine: Degenerative changes of the upper cervical spine have progressed. Craniocervical junction is normal. Marrow signal is normal. Sinuses/Orbits: The paranasal sinuses and mastoid air cells are clear. Bilateral lens replacements are noted. Globes and orbits are otherwise unremarkable. IMPRESSION: 1. Scattered punctate foci of restricted diffusion as described. The finding is  nonspecific but can be seen in the  setting of acute/subacute ischemia, a demyelinating process such as multiple sclerosis, vasculitis, complicated migraine headaches, or as the sequelae of a prior infectious or inflammatory process. 2. Other remote lacunar infarcts of the cerebellum bilaterally. 3. Progressive degenerative changes of the upper cervical spine. Electronically Signed   By: San Morelle M.D.   On: 08/28/2021 15:20   ECHOCARDIOGRAM COMPLETE  Result Date: 08/27/2021    ECHOCARDIOGRAM REPORT   Patient Name:   Khamil P Amir Date of Exam: 08/27/2021 Medical Rec #:  [id number]        Height:       62.0 in Accession #:    [id number]       Weight:       132.0 lb Date of Birth:  1935/03/27        BSA:          1.602 m Patient Age:    89 years         BP:           106/58 mmHg Patient Gender: F                HR:           78 bpm. Exam Location:  Inpatient Procedure: 2D Echo, Cardiac Doppler and Color Doppler Indications:    NSTEMI I21.4  History:        Patient has prior history of Echocardiogram examinations, most                 recent 08/08/2021. Stroke, Arrythmias:Bradycardia; Risk                 Factors:Diabetes, GERD, Hypertension and Sleep Apnea.  Sonographer:    Bernadene Person RDCS Referring Phys: [id number] [place]  1. Right ventricular systolic function is moderately reduced. The right ventricular size is mildly enlarged. There is moderately elevated pulmonary artery systolic pressure. The estimated right ventricular systolic pressure is 35.5 mmHg. Normal apical function with free wall hypokinesis, consistent with McConnell's sign as can be seen in acute PE (image 58). Consider evaluation for PE.  2. Left ventricular ejection fraction, by estimation, is 60 to 65%. The left ventricle has normal function. The left ventricle has no regional wall motion abnormalities. Left ventricular diastolic parameters are consistent with Grade III diastolic dysfunction (restrictive).  Elevated left atrial pressure.  3. Left atrial size was severely dilated.  4. Right atrial size was severely dilated.  5. The mitral valve is degenerative. Mild mitral valve regurgitation. No evidence of mitral stenosis. Moderate mitral annular calcification.  6. Tricuspid valve regurgitation is mild to moderate.  7. The aortic valve is tricuspid. Aortic valve regurgitation is not visualized. Aortic valve sclerosis is present, with no evidence of aortic valve stenosis.  8. The inferior vena cava is dilated in size with <50% respiratory variability, suggesting right atrial pressure of 15 mmHg. FINDINGS  Left Ventricle: Left ventricular ejection fraction, by estimation, is 60 to 65%. The left ventricle has normal function. The left ventricle has no regional wall motion abnormalities. The left ventricular internal cavity size was normal in size. There is  no left ventricular hypertrophy. Left ventricular diastolic parameters are consistent with Grade III diastolic dysfunction (restrictive). Elevated left atrial pressure. Right Ventricle: The right ventricular size is mildly enlarged. No increase in right ventricular wall thickness. Right ventricular systolic function is moderately reduced. There is moderately elevated pulmonary artery systolic pressure. The tricuspid regurgitant velocity is 2.79 m/s, and with an assumed right atrial  pressure of 15 mmHg, the estimated right ventricular systolic pressure is 16.1 mmHg. Left Atrium: Left atrial size was severely dilated. Right Atrium: Right atrial size was severely dilated. Pericardium: There is no evidence of pericardial effusion. Mitral Valve: The mitral valve is degenerative in appearance. Moderate mitral annular calcification. Mild mitral valve regurgitation. No evidence of mitral valve stenosis. Tricuspid Valve: The tricuspid valve is normal in structure. Tricuspid valve regurgitation is mild to moderate. Aortic Valve: The aortic valve is tricuspid. Aortic valve  regurgitation is not visualized. Aortic valve sclerosis is present, with no evidence of aortic valve stenosis. Pulmonic Valve: The pulmonic valve was not well visualized. Pulmonic valve regurgitation is not visualized. Aorta: The aortic root and ascending aorta are structurally normal, with no evidence of dilitation. Venous: The inferior vena cava is dilated in size with less than 50% respiratory variability, suggesting right atrial pressure of 15 mmHg. IAS/Shunts: The interatrial septum was not well visualized.  LEFT VENTRICLE PLAX 2D LVIDd:         4.20 cm     Diastology LVIDs:         2.60 cm     LV e' medial:    6.46 cm/s LV PW:         0.80 cm     LV E/e' medial:  17.3 LV IVS:        0.70 cm     LV e' lateral:   8.25 cm/s LVOT diam:     1.90 cm     LV E/e' lateral: 13.6 LV SV:         67 LV SV Index:   42 LVOT Area:     2.84 cm  LV Volumes (MOD) LV vol d, MOD A2C: 52.2 ml LV vol d, MOD A4C: 58.5 ml LV vol s, MOD A2C: 24.0 ml LV vol s, MOD A4C: 19.1 ml LV SV MOD A2C:     28.2 ml LV SV MOD A4C:     58.5 ml LV SV MOD BP:      33.7 ml RIGHT VENTRICLE RV S prime:     9.76 cm/s TAPSE (M-mode): 1.0 cm LEFT ATRIUM             Index        RIGHT ATRIUM           Index LA diam:        4.50 cm 2.81 cm/m   RA Area:     26.80 cm LA Vol (A2C):   87.2 ml 54.42 ml/m  RA Volume:   98.10 ml  61.23 ml/m LA Vol (A4C):   94.0 ml 58.67 ml/m LA Biplane Vol: 91.0 ml 56.80 ml/m  AORTIC VALVE LVOT Vmax:   119.00 cm/s LVOT Vmean:  80.000 cm/s LVOT VTI:    0.235 m  AORTA Ao Root diam: 3.20 cm Ao Asc diam:  3.50 cm MITRAL VALVE                TRICUSPID VALVE MV Area (PHT): 3.91 cm     TR Peak grad:   31.1 mmHg MV Decel Time: 194 msec     TR Vmax:        279.00 cm/s MV E velocity: 112.00 cm/s MV A velocity: 40.30 cm/s   SHUNTS MV E/A ratio:  2.78         Systemic VTI:  0.24 m  Systemic Diam: 1.90 cm Oswaldo Milian MD Electronically signed by Oswaldo Milian MD Signature Date/Time:  08/27/2021/5:23:14 PM    Final    CARDIAC CATHETERIZATION  Result Date: 08/27/2021   Prox RCA lesion is 20% stenosed.   Mid LM to Dist LM lesion is 20% stenosed.   Ost LAD lesion is 30% stenosed.   Mid Cx lesion is 20% stenosed.   Prox LAD lesion is 50% stenosed.   The left ventricular systolic function is normal.   LV end diastolic pressure is normal.   The left ventricular ejection fraction is 55-65% by visual estimate.   There is no mitral valve regurgitation. Mild non-obstructive CAD Normal LV systolic function Elevated troponin and chest pain is likely due to demand ischemic in the setting of rapid atrial fibrillation. Recommendation: Medical management of CAD.   CT CHEST ABDOMEN PELVIS W CONTRAST  Result Date: 08/25/2021 CLINICAL DATA:  History of recent renal ablation with tachycardia, initial encounter EXAM: CT CHEST, ABDOMEN, AND PELVIS WITH CONTRAST TECHNIQUE: Multidetector CT imaging of the chest, abdomen and pelvis was performed following the standard protocol during bolus administration of intravenous contrast. RADIATION DOSE REDUCTION: This exam was performed according to the departmental dose-optimization program which includes automated exposure control, adjustment of the mA and/or kV according to patient size and/or use of iterative reconstruction technique. CONTRAST:  69m OMNIPAQUE IOHEXOL 300 MG/ML  SOLN COMPARISON:  None Available. FINDINGS: CT CHEST FINDINGS Cardiovascular: Scattered atherosclerotic calcifications are noted. No aneurysmal dilatation or dissection is noted. No cardiac enlargement is seen. The pulmonary artery shows a normal branching pattern bilaterally. No filling defect to suggest pulmonary embolism is noted. Scattered coronary calcifications are noted. Mediastinum/Nodes: Thoracic inlet is within normal limits. No hilar or mediastinal adenopathy is noted. The esophagus is within normal limits. Lungs/Pleura: Lungs are well aerated bilaterally. No focal infiltrate or  sizable effusion is noted. Minimal scarring is noted in the right base. Tiny less than 4 mm nodule is noted in the superior segment of the left lower lobe. No other nodules are noted. Musculoskeletal: Degenerative changes of the thoracic spine are noted. No rib abnormality is noted. CT ABDOMEN PELVIS FINDINGS Hepatobiliary: No focal liver abnormality is seen. Status post cholecystectomy. No biliary dilatation. Pancreas: Postsurgical changes are noted consistent with prior Whipple. The residual pancreas is atrophic. Spleen: Scattered cysts are noted within the spleen. Adrenals/Urinary Tract: Adrenal glands are within normal limits. Kidneys demonstrate multiple hypodense lesions throughout both kidneys. A mildly enhancing solid lesion is noted in the medial aspect of the left lower pole. This has undergone recent ablation and coil embolization. No new focal abnormality is noted. No obstructive changes are seen. The bladder is decompressed. Stomach/Bowel: No obstructive or inflammatory changes of the colon are seen. Postsurgical changes are noted consistent with the prior Whipple procedure. The appendix is not well visualized and may have been surgically removed. No inflammatory changes are seen. Visualized small bowel is within normal limits aside from the previously described surgical change. Vascular/Lymphatic: Aortic atherosclerosis. No enlarged abdominal or pelvic lymph nodes. Reproductive: Status post hysterectomy. No adnexal masses. Other: No abdominal wall hernia or abnormality. No abdominopelvic ascites. Musculoskeletal: Degenerative changes of lumbar spine are noted. No acute bony abnormality is noted. IMPRESSION: CT of the chest: No evidence of pulmonary emboli. 3 mm left solid pulmonary nodule. No routine follow-up imaging is recommended per Fleischner Society Guidelines. These guidelines do not apply to immunocompromised patients and patients with cancer. Follow up in patients with significant  comorbidities as  clinically warranted. For lung cancer screening, adhere to Lung-RADS guidelines. Reference: Radiology. 2017; 284(1):228-43. CT of the abdomen and pelvis: Changes consistent with prior Whipple procedure. Stable solid mass in the medial aspect of the left kidney. This has recently undergone ablation as well as coil embolization. No acute abnormality noted. Electronically Signed   By: Inez Catalina M.D.   On: 08/25/2021 22:06   CT GUIDE TISSUE ABLATION  Result Date: 08/22/2021 INDICATION: History of renal cell carcinoma. Poor operative candidate. Patient presents today for image guided left-sided renal microwave ablation following percutaneous particle and coil embolization performed this morning. Please refer to formal consultation in epic EMR dated 06/04/2021 additional details. EXAM: CT-GUIDED PERCUTANEOUS THERMAL ABLATION OF RENAL LESION ANESTHESIA/SEDATION: General MEDICATIONS: None, antibiotics were administered during the preceding renal arteriogram and percutaneous embolization. CONTRAST:  None COMPARISONS: COMPARISONS CT abdomen and pelvis-05/03/2021 PROCEDURE: The procedure, risks, benefits, and alternatives were explained to the patient. Questions regarding the procedure were encouraged and answered. The patient understands and consents to the procedure. The patient was placed under general anesthesia. Initial unenhanced CT was performed in a prone position to localize the at least 3.5 x 3.1 cm partially exophytic lesion involving the inferior medial aspect of the left kidney (image 67, series 2). The CT gantry table position was marked and lesion was identified sonographically. The procedure was planned. The patient was prepped with chlorhexidine in usual sterile fashion. Two 22 gauge spinal needles were utilized for procedural purposes. Next, under a combination ultrasound and CT guidance, 2 15 gauge microwave ablation probes were positioned bracketing the mid aspect the central  left-sided renal lesion. Multiple ultrasound images were saved procedural documentation purposes. Appropriate positioning was confirmed and a 1 minute burn was performed at 90 watts followed by a 4 minute burn at 65 watts with intra procedural imaging obtained at the 3 minute mark of the ablation. Both probes were removed and postprocedural imaging was obtained. Access sites were anesthetized with 1% lidocaine with epinephrine and dressings were applied. The patient tolerated the procedure well without immediate postprocedural complication. COMPLICATIONS: None immediate. FINDINGS: Noncontrast preprocedural imaging demonstrates an approximally 3.5 x 3.1 cm partially exophytic lesion arising from the inferior medial aspect of the left kidney (image 67, series 2). Contrast is noted within the hypervascular lesion as well as within the adjacent inferior pole of the left kidney following preceding particle and coil embolization. The lesion was identified sonographically and utilizing a combination of CT and ultrasound guidance, 2 probes were placed bracketing the central aspect of the lesion (representative image 17, series 12). The microwave ablation site appears to encompass the entirety of the partially exophytic left-sided renal lesion (series 13). Postprocedural imaging demonstrates expected air about the ablation site without postprocedural complication. Specifically, no pneumothorax or significant hemorrhage about the ablation site. IMPRESSION: Technically successful ultrasound and CT-guided thermal ablation of partially exophytic left-sided renal lesion. PLAN: - The patient will be admitted overnight for continued observation and PCA usage. - The patient will have a telemedicine consultation approximally 4 weeks following today's procedure with initial postprocedural imaging obtained in 3 months (early October 2023). Electronically Signed   By: Sandi Mariscal M.D.   On: 08/22/2021 14:29   IR EMBO TUMOR ORGAN  ISCHEMIA INFARCT INC GUIDE ROADMAPPING  Result Date: 08/22/2021 INDICATION: Left-sided renal cell carcinoma. Poor operative candidate. Patient presents today for left-sided renal arteriogram and percutaneous embolization prior to image guided ablation. Please refer to consultation epic EMR dated 06/04/2021 for additional details. EXAM: 1. ULTRASOUND  GUIDANCE FOR ARTERIAL ACCESS 2. LEFT RENAL ARTERIOGRAM 3. SUB SELECTIVE ARTERIOGRAM OF THE INFERIOR MEDIAL DIVISION OF THE LEFT RENAL ARTERY AND PERCUTANEOUS PARTICLE AND COIL EMBOLIZATION. 4. SUB SELECTIVE ARTERIOGRAM OF THE INFEROLATERAL DIVISION OF THE LEFT RENAL ARTERY AND PERCUTANEOUS PARTICLE AND COIL EMBOLIZATION MEDICATIONS: Cefoxitin 2 gm IV. The antibiotic was administered within one hour of the procedure ANESTHESIA/SEDATION: Moderate (conscious) sedation was employed during this procedure as administered by the Interventional Radiology RN. A total of Versed 0.5 mg and Fentanyl 50 mcg was administered intravenously. Moderate Sedation Time: 56 minutes. The patient's level of consciousness and vital signs were monitored continuously by radiology nursing throughout the procedure under my direct supervision. CONTRAST:  120 cc Omnipaque 300 FLUOROSCOPY TIME:  626 mGy COMPLICATIONS: None immediate. PROCEDURE: Informed consent was obtained from the patient following explanation of the procedure, risks, benefits and alternatives. The patient understands, agrees and consents for the procedure. All questions were addressed. A time out was performed prior to the initiation of the procedure. Maximal barrier sterile technique utilized including caps, mask, sterile gowns, sterile gloves, large sterile drape, hand hygiene, and Betadine prep. The right femoral head was marked fluoroscopically. Under sterile conditions and local anesthesia, the right common femoral artery access was performed with a micropuncture needle. Under direct ultrasound guidance, the right common  femoral was accessed with a micropuncture kit. An ultrasound image was saved for documentation purposes. This allowed for placement of a 5-French vascular sheath. A limited arteriogram was performed through the side arm of the sheath confirming appropriate access within the right common femoral artery. The 5-French Mickelson catheter was advanced to the level of the thoracic aorta where it was performed, back bled and flushed. The Mickelson catheter was then utilized to select the left renal artery and a left renal arteriogram was performed. Next, with the use of a fathom 14 microwire, a cantata microcatheter was utilized to select the inferior division the left renal artery and a sub selective arteriogram was performed The microcatheter was then utilized to select the inferolateral division of the left renal artery and a sub selective arteriogram was performed. Next, the vessel was embolized with approximally 1/5 vial of 100-300 micron Embospheres. Once vessel stasis was achieved, the vessel was embolized with overlapping 4 mm and 2 mm interlock coils. The microcatheter was partially retracted and a post embolization arteriogram was performed. The microcatheter was then utilized to select the inferior medial division of the left renal artery and a sub selective arteriogram was performed. Next, the vessel was embolized with approximally 1/5 vial of 103 100 micron Embospheres. Again, once vessel stasis was achieved, the vessel was embolized with overlapping 4 mm and 2 mm interlock coils. The microcatheter was partially retracted and a post embolization arteriogram was performed. The microcatheter was then removed and a completion arteriogram was performed via the [facility] catheter. Images reviewed and the procedure was terminated. All wires, catheters and sheaths were removed the patient. Hemostasis was achieved at the right groin access site with deployment of a Celt closure device. Postprocedural spot fluoroscopic  image demonstrates appropriate positioning of the puncture location at the level of the humeral head. A dressing was applied. The patient tolerated the procedure well without immediate post procedural complication. FINDINGS: Selective right renal arteriogram demonstrates a fairly well-defined partially exophytic hypervascular lesion arising from the inferior pole of the left kidney supplied by both the inferolateral and inferior medial divisions of the left renal artery, as was suggested on preceding CTA. Following percutaneous particle  and coil embolization of both the inferolateral and inferior medial divisions of the left renal artery, completion arteriogram demonstrates no residual flow to the hypervascular renal mass with preserved arterial supply to the remainder of the renal parenchyma. IMPRESSION: Technically successful percutaneous particle and coil embolization of both the inferomedial and inferolateral divisions of the left renal artery supplying the known partially exophytic hypervascular left-sided RCC. PLAN: Patient was then escorted to CT for image guided microwave ablation of the left-sided renal mass. Electronically Signed   By: Sandi Mariscal M.D.   On: 08/22/2021 11:22   IR US Guide Vasc Access Right  Result Date: 08/22/2021 INDICATION: Left-sided renal cell carcinoma. Poor operative candidate. Patient presents today for left-sided renal arteriogram and percutaneous embolization prior to image guided ablation. Please refer to consultation epic EMR dated 06/04/2021 for additional details. EXAM: 1. ULTRASOUND GUIDANCE FOR ARTERIAL ACCESS 2. LEFT RENAL ARTERIOGRAM 3. SUB SELECTIVE ARTERIOGRAM OF THE INFERIOR MEDIAL DIVISION OF THE LEFT RENAL ARTERY AND PERCUTANEOUS PARTICLE AND COIL EMBOLIZATION. 4. SUB SELECTIVE ARTERIOGRAM OF THE INFEROLATERAL DIVISION OF THE LEFT RENAL ARTERY AND PERCUTANEOUS PARTICLE AND COIL EMBOLIZATION MEDICATIONS: Cefoxitin 2 gm IV. The antibiotic was administered within one  hour of the procedure ANESTHESIA/SEDATION: Moderate (conscious) sedation was employed during this procedure as administered by the Interventional Radiology RN. A total of Versed 0.5 mg and Fentanyl 50 mcg was administered intravenously. Moderate Sedation Time: 56 minutes. The patient's level of consciousness and vital signs were monitored continuously by radiology nursing throughout the procedure under my direct supervision. CONTRAST:  120 cc Omnipaque 300 FLUOROSCOPY TIME:  416 mGy COMPLICATIONS: None immediate. PROCEDURE: Informed consent was obtained from the patient following explanation of the procedure, risks, benefits and alternatives. The patient understands, agrees and consents for the procedure. All questions were addressed. A time out was performed prior to the initiation of the procedure. Maximal barrier sterile technique utilized including caps, mask, sterile gowns, sterile gloves, large sterile drape, hand hygiene, and Betadine prep. The right femoral head was marked fluoroscopically. Under sterile conditions and local anesthesia, the right common femoral artery access was performed with a micropuncture needle. Under direct ultrasound guidance, the right common femoral was accessed with a micropuncture kit. An ultrasound image was saved for documentation purposes. This allowed for placement of a 5-French vascular sheath. A limited arteriogram was performed through the side arm of the sheath confirming appropriate access within the right common femoral artery. The 5-French Mickelson catheter was advanced to the level of the thoracic aorta where it was performed, back bled and flushed. The Mickelson catheter was then utilized to select the left renal artery and a left renal arteriogram was performed. Next, with the use of a fathom 14 microwire, a cantata microcatheter was utilized to select the inferior division the left renal artery and a sub selective arteriogram was performed The microcatheter was  then utilized to select the inferolateral division of the left renal artery and a sub selective arteriogram was performed. Next, the vessel was embolized with approximally 1/5 vial of 100-300 micron Embospheres. Once vessel stasis was achieved, the vessel was embolized with overlapping 4 mm and 2 mm interlock coils. The microcatheter was partially retracted and a post embolization arteriogram was performed. The microcatheter was then utilized to select the inferior medial division of the left renal artery and a sub selective arteriogram was performed. Next, the vessel was embolized with approximally 1/5 vial of 103 100 micron Embospheres. Again, once vessel stasis was achieved, the vessel was embolized  with overlapping 4 mm and 2 mm interlock coils. The microcatheter was partially retracted and a post embolization arteriogram was performed. The microcatheter was then removed and a completion arteriogram was performed via the [facility] catheter. Images reviewed and the procedure was terminated. All wires, catheters and sheaths were removed the patient. Hemostasis was achieved at the right groin access site with deployment of a Celt closure device. Postprocedural spot fluoroscopic image demonstrates appropriate positioning of the puncture location at the level of the humeral head. A dressing was applied. The patient tolerated the procedure well without immediate post procedural complication. FINDINGS: Selective right renal arteriogram demonstrates a fairly well-defined partially exophytic hypervascular lesion arising from the inferior pole of the left kidney supplied by both the inferolateral and inferior medial divisions of the left renal artery, as was suggested on preceding CTA. Following percutaneous particle and coil embolization of both the inferolateral and inferior medial divisions of the left renal artery, completion arteriogram demonstrates no residual flow to the hypervascular renal mass with preserved  arterial supply to the remainder of the renal parenchyma. IMPRESSION: Technically successful percutaneous particle and coil embolization of both the inferomedial and inferolateral divisions of the left renal artery supplying the known partially exophytic hypervascular left-sided RCC. PLAN: Patient was then escorted to CT for image guided microwave ablation of the left-sided renal mass. Electronically Signed   By: Sandi Mariscal M.D.   On: 08/22/2021 11:22   IR Angiogram Renal Uni Selective  Result Date: 08/22/2021 INDICATION: Left-sided renal cell carcinoma. Poor operative candidate. Patient presents today for left-sided renal arteriogram and percutaneous embolization prior to image guided ablation. Please refer to consultation epic EMR dated 06/04/2021 for additional details. EXAM: 1. ULTRASOUND GUIDANCE FOR ARTERIAL ACCESS 2. LEFT RENAL ARTERIOGRAM 3. SUB SELECTIVE ARTERIOGRAM OF THE INFERIOR MEDIAL DIVISION OF THE LEFT RENAL ARTERY AND PERCUTANEOUS PARTICLE AND COIL EMBOLIZATION. 4. SUB SELECTIVE ARTERIOGRAM OF THE INFEROLATERAL DIVISION OF THE LEFT RENAL ARTERY AND PERCUTANEOUS PARTICLE AND COIL EMBOLIZATION MEDICATIONS: Cefoxitin 2 gm IV. The antibiotic was administered within one hour of the procedure ANESTHESIA/SEDATION: Moderate (conscious) sedation was employed during this procedure as administered by the Interventional Radiology RN. A total of Versed 0.5 mg and Fentanyl 50 mcg was administered intravenously. Moderate Sedation Time: 56 minutes. The patient's level of consciousness and vital signs were monitored continuously by radiology nursing throughout the procedure under my direct supervision. CONTRAST:  120 cc Omnipaque 300 FLUOROSCOPY TIME:  622 mGy COMPLICATIONS: None immediate. PROCEDURE: Informed consent was obtained from the patient following explanation of the procedure, risks, benefits and alternatives. The patient understands, agrees and consents for the procedure. All questions were addressed.  A time out was performed prior to the initiation of the procedure. Maximal barrier sterile technique utilized including caps, mask, sterile gowns, sterile gloves, large sterile drape, hand hygiene, and Betadine prep. The right femoral head was marked fluoroscopically. Under sterile conditions and local anesthesia, the right common femoral artery access was performed with a micropuncture needle. Under direct ultrasound guidance, the right common femoral was accessed with a micropuncture kit. An ultrasound image was saved for documentation purposes. This allowed for placement of a 5-French vascular sheath. A limited arteriogram was performed through the side arm of the sheath confirming appropriate access within the right common femoral artery. The 5-French Mickelson catheter was advanced to the level of the thoracic aorta where it was performed, back bled and flushed. The Mickelson catheter was then utilized to select the left renal artery and a left renal  arteriogram was performed. Next, with the use of a fathom 14 microwire, a cantata microcatheter was utilized to select the inferior division the left renal artery and a sub selective arteriogram was performed The microcatheter was then utilized to select the inferolateral division of the left renal artery and a sub selective arteriogram was performed. Next, the vessel was embolized with approximally 1/5 vial of 100-300 micron Embospheres. Once vessel stasis was achieved, the vessel was embolized with overlapping 4 mm and 2 mm interlock coils. The microcatheter was partially retracted and a post embolization arteriogram was performed. The microcatheter was then utilized to select the inferior medial division of the left renal artery and a sub selective arteriogram was performed. Next, the vessel was embolized with approximally 1/5 vial of 103 100 micron Embospheres. Again, once vessel stasis was achieved, the vessel was embolized with overlapping 4 mm and 2 mm  interlock coils. The microcatheter was partially retracted and a post embolization arteriogram was performed. The microcatheter was then removed and a completion arteriogram was performed via the [facility] catheter. Images reviewed and the procedure was terminated. All wires, catheters and sheaths were removed the patient. Hemostasis was achieved at the right groin access site with deployment of a Celt closure device. Postprocedural spot fluoroscopic image demonstrates appropriate positioning of the puncture location at the level of the humeral head. A dressing was applied. The patient tolerated the procedure well without immediate post procedural complication. FINDINGS: Selective right renal arteriogram demonstrates a fairly well-defined partially exophytic hypervascular lesion arising from the inferior pole of the left kidney supplied by both the inferolateral and inferior medial divisions of the left renal artery, as was suggested on preceding CTA. Following percutaneous particle and coil embolization of both the inferolateral and inferior medial divisions of the left renal artery, completion arteriogram demonstrates no residual flow to the hypervascular renal mass with preserved arterial supply to the remainder of the renal parenchyma. IMPRESSION: Technically successful percutaneous particle and coil embolization of both the inferomedial and inferolateral divisions of the left renal artery supplying the known partially exophytic hypervascular left-sided RCC. PLAN: Patient was then escorted to CT for image guided microwave ablation of the left-sided renal mass. Electronically Signed   By: Sandi Mariscal M.D.   On: 08/22/2021 11:22   ECHOCARDIOGRAM COMPLETE  Result Date: 08/08/2021    ECHOCARDIOGRAM REPORT   Patient Name:   Nioma P Railey Date of Exam: 08/08/2021 Medical Rec #:  [id number]        Height:       64.5 in Accession #:    [id number]       Weight:       127.0 lb Date of Birth:  September 14, 1935        BSA:           1.622 m Patient Age:    52 years         BP:           146/70 mmHg Patient Gender: F                HR:           63 bpm. Exam Location:  [place] Procedure: 2D Echo, Cardiac Doppler, Color Doppler and 3D Echo Indications:    Pre-op eval  History:        Patient has prior history of Echocardiogram examinations, most                 recent 06/19/2013. End stage renal  disease ([place])                 Orthostatic hypotension                 Controlled type 2 diabetes mellitus with stage 4 chronic kidney                 disease, unspecified whether long term insulin use ([place].  Sonographer:    [name] RDCS Referring Phys: [id number] PETER M [place] IMPRESSIONS  1. GLS was not performed     . Left ventricular ejection fraction, by estimation, is 60 to 65%. The left ventricle has normal function. The left ventricle has no regional wall motion abnormalities. There is mild concentric left ventricular hypertrophy. Left ventricular diastolic  parameters are consistent with Grade I diastolic dysfunction (impaired relaxation).  2. Right ventricular systolic function is normal. The right ventricular size is normal.  3. Left atrial size was mildly dilated.  4. The mitral valve is normal in structure. Mild mitral valve regurgitation. No evidence of mitral stenosis. Moderate mitral annular calcification.  5. The aortic valve is tricuspid. Aortic valve regurgitation is not visualized. No aortic stenosis is present.  6. The inferior vena cava is normal in size with greater than 50% respiratory variability, suggesting right atrial pressure of 3 mmHg. FINDINGS  Left Ventricle: GLS was not performed. Left ventricular ejection fraction, by estimation, is 60 to 65%. The left ventricle has normal function. The left ventricle has no regional wall motion abnormalities. The left ventricular internal cavity size was normal in size. There is mild concentric left ventricular hypertrophy. Left ventricular diastolic parameters are consistent with  Grade I diastolic dysfunction (impaired relaxation). Indeterminate filling pressures. Right Ventricle: The right ventricular size is normal. No increase in right ventricular wall thickness. Right ventricular systolic function is normal. Left Atrium: Left atrial size was mildly dilated. Right Atrium: Right atrial size was normal in size. Pericardium: There is no evidence of pericardial effusion. Mitral Valve: The mitral valve is normal in structure. Moderate mitral annular calcification. Mild mitral valve regurgitation. No evidence of mitral valve stenosis. Tricuspid Valve: The tricuspid valve is normal in structure. Tricuspid valve regurgitation is not demonstrated. No evidence of tricuspid stenosis. Aortic Valve: The aortic valve is tricuspid. Aortic valve regurgitation is not visualized. No aortic stenosis is present. Aortic valve mean gradient measures 5.0 mmHg. Aortic valve peak gradient measures 8.6 mmHg. Pulmonic Valve: The pulmonic valve was normal in structure. Pulmonic valve regurgitation is not visualized. No evidence of pulmonic stenosis. Aorta: The aortic root is normal in size and structure, the aortic arch was not well visualized and the ascending aorta was not well visualized. Ascending aorta measurements are within normal limits for age when indexed to body surface area. Venous: The pulmonary veins were not well visualized. The inferior vena cava is normal in size with greater than 50% respiratory variability, suggesting right atrial pressure of 3 mmHg. IAS/Shunts: The interatrial septum appears to be lipomatous. No atrial level shunt detected by color flow Doppler.  LEFT VENTRICLE PLAX 2D LVIDd:         4.50 cm Diastology LVIDs:         2.80 cm LV e' medial:    6.42 cm/s LV PW:         1.00 cm LV E/e' medial:  12.7 LV IVS:        1.10 cm LV e' lateral:   9.90 cm/s  LV E/e' lateral: 8.3                         3D Volume EF:                        3D EF:        67 %                         LV EDV:       92 ml                        LV ESV:       30 ml                        LV SV:        62 ml RIGHT VENTRICLE RV Basal diam:  3.10 cm RV S prime:     11.00 cm/s TAPSE (M-mode): 2.0 cm LEFT ATRIUM             Index        RIGHT ATRIUM           Index LA diam:        4.10 cm 2.53 cm/m   RA Area:     14.60 cm LA Vol (A2C):   61.9 ml 38.17 ml/m  RA Volume:   31.90 ml  19.67 ml/m LA Vol (A4C):   44.0 ml 27.13 ml/m LA Biplane Vol: 56.5 ml 34.84 ml/m  AORTIC VALVE AV Vmax:           147.00 cm/s AV Vmean:          105.000 cm/s AV VTI:            0.320 m AV Peak Grad:      8.6 mmHg AV Mean Grad:      5.0 mmHg LVOT Vmax:         102.00 cm/s LVOT Vmean:        75.300 cm/s LVOT VTI:          0.227 m LVOT/AV VTI ratio: 0.71  AORTA Ao Root diam: 3.10 cm MITRAL VALVE MV Area (PHT): 2.07 cm    SHUNTS MV Decel Time: 367 msec    Systemic VTI: 0.23 m MV E velocity: 81.80 cm/s MV A velocity: 95.10 cm/s MV E/A ratio:  0.86 Shirlee More MD Electronically signed by Shirlee More MD Signature Date/Time: 08/08/2021/6:07:34 PM    Final     Micro Results    No results found for this or any previous visit (from the past 240 hour(s)).  Today   Subjective    Kim Burns feels much improved since admission.  Feels ready to go home.  Denies chest pain, shortness of breath or abdominal pain.  Feels they can take care of themselves with the resources they have at home.  Set to leave 7/13, unsure why did   Objective   Blood pressure (!) 108/52, pulse 74, temperature 99 F (37.2 C), temperature source Oral, resp. rate 16, height _0  (1.575 m), weight 57.5 kg, SpO2 97 %.   Intake/Output Summary (Last 24 hours) at 08/31/2021 0747 Last data filed at 08/31/2021 0143 Gross per 24 hour  Intake --  Output 1900 ml  Net -1900 ml    Exam General: Patient appears well and in good spirits  sitting up in bed in no acute distress.  Eyes: sclera anicteric, conjuctiva mild injection bilaterally CVS: S1-S2,  regular  Respiratory:  decreased air entry bilaterally secondary to decreased inspiratory effort, rales at bases  GI: NABS, soft, NT  LE: No edema.  Neuro: A/O x 3, Moving all extremities equally with normal strength, CN 3-12 intact, grossly nonfocal.  Psych: patient is logical and coherent, judgement and insight appear normal, mood and affect appropriate to situation.    Data Review   CBC w Diff:  Lab Results  Component Value Date   WBC 10.5 08/31/2021   HGB 8.0 (L) 08/31/2021   HCT 24.1 (L) 08/31/2021   HCT 28.0 (L) 10/05/2016   PLT 351 08/31/2021   LYMPHOPCT 10 08/30/2021   MONOPCT 9 08/30/2021   EOSPCT 1 08/30/2021   BASOPCT 0 08/30/2021    CMP:  Lab Results  Component Value Date   NA 134 (L) 08/31/2021   K 3.5 08/31/2021   CL 96 (L) 08/31/2021   CO2 26 08/31/2021   BUN 17 08/31/2021   CREATININE 2.92 (H) 08/31/2021   PROT 6.6 08/25/2021   ALBUMIN 2.9 (L) 08/25/2021   BILITOT 0.8 08/25/2021   ALKPHOS 87 08/25/2021   AST 21 08/25/2021   ALT 23 08/25/2021  .   Total Time in preparing paper work, data evaluation and todays exam - 82 minutes  Vanna Scotland M.D on 08/31/2021 at 7:47 AM  [facility]

## 2021-08-31 NOTE — Progress Notes (Signed)
[facility]   Subjective  -no complaints.  States her left arm fistula worked well in dialysis after fistulogram yesterday   Objective (!) 110/54 70 98.9 F (37.2 C) 19 94%  Intake/Output Summary (Last 24 hours) at 08/31/2021 0909 Last data filed at 08/31/2021 0143 Gross per 24 hour  Intake --  Output 1900 ml  Net -1900 ml    Left arm AVF with excellent thrill  Laboratory Lab Results: Recent Labs    08/30/21 0304 08/31/21 0518  WBC 9.5 10.5  HGB 8.0* 8.0*  HCT 25.6* 24.1*  PLT 306 351   BMET Recent Labs    08/30/21 0304 08/31/21 0518  NA 129* 134*  K 3.6 3.5  CL 91* 96*  CO2 23 26  GLUCOSE 146* 143*  BUN 40* 17  CREATININE 5.12* 2.92*  CALCIUM 8.4* 8.1*    COAG Lab Results  Component Value Date   INR 0.9 08/22/2021   INR 1.11 11/12/2016   INR 1.15 04/24/2016   No results found for: "PTT"  Assessment/Planning:  Postop day 1 status post left arm fistulogram with intervention for malfunction of her fistula.  States her left arm fistula worked great in dialysis last night.  Good thrill this morning.  We will sign off.  Discussed she let us know if she has any ongoing issues.  Marty Heck 08/31/2021 9:09 AM --

## 2021-08-31 NOTE — Progress Notes (Signed)
Pt NSR on monitor and A/Ox 3/4 ( Baseline). Admitted for A.fib. Meds adjusted. Fistuloaram revision performed yesterday. L. Arm is intact, bruit heard and thrill felt. Pt denies any pain or active bleeding. VSS. Follow up care and meds explained to daughter.Pt adequate for discharge.

## 2021-08-31 NOTE — Progress Notes (Signed)
Mobility Specialist Progress Note:   08/31/21 0910  Mobility  Activity Ambulated with assistance to bathroom  Level of Assistance Standby assist, set-up cues, supervision of patient - no hands on  Assistive Device Front wheel walker  Distance Ambulated (ft) 20 ft  Activity Response Tolerated well  $Mobility charge 1 Mobility   Pt received in bed willing to participate in mobility. No complaints of pain. Left in bathroom and was instructed to pull string when finished.   [facility] Alicea Wente Mobility Specialist

## 2021-08-31 NOTE — Plan of Care (Signed)
  Problem: Coping: Goal: Ability to adjust to condition or change in health will improve Outcome: Progressing   Problem: Fluid Volume: Goal: Ability to maintain a balanced intake and output will improve Outcome: Progressing   Problem: Tissue Perfusion: Goal: Adequacy of tissue perfusion will improve Outcome: Progressing   Problem: Education: Goal: Knowledge of General Education information will improve Description: Including pain rating scale, medication(s)/side effects and non-pharmacologic comfort measures Outcome: Progressing

## 2021-08-31 NOTE — Progress Notes (Addendum)
Subjective: Seen in room, complains of mild hypotension after dialysis earlier this a.m. =got something to eat and resolved, now ready DC ,the fistula used without complications.  Discussed with her taking midodrine daily, and Toprol at bedtime  Objective Vital signs in last 24 hours: Vitals:   08/31/21 0236 08/31/21 0237 08/31/21 0610 08/31/21 0803  BP:   (!) 108/52 (!) 110/54  Pulse: 75 73 74 70  Resp: (!) 21 (!) '23 16 19  '$ Temp:   99 F (37.2 C) 98.9 F (37.2 C)  TempSrc:   Oral   SpO2: 98% 97% 97% 94%  Weight:   57.5 kg   Height:       Weight change: 0.6 kg  Physical Exam: General: Alert pleasant elderly female NAD Heart: RRR no MRG Lungs: CTA bilaterally nonlabored breathing Abdomen: NABS soft NT ND Extremities: No pedal edema  dialysis Access: Positive bruit left radiocephalic AV F  Problem/Plan: NSTEMI/SP LHC (08/27/2021) nonobstructive disease ^ Trop secondary to demand ischemia/intolerance statin ESRD -HD MWF, plans for dialysis tomorrow Saturday also Functioning left brachial basilic AVF(prolonged bleeding) sp 08/30/21 Dr. Carlis Abbott pta fistulogram= HD yest.  AV fistula worked well A-fib with RVR= conversion to NSR, continue Meto rate control/Eliquis HTN/volume -euvolemic on exam low blood pressure midodrine was increased and will ^ dry weight to 58  Vision change= neuro eval related to episodes of low BP midodrine increased to 10 mg daily, no further work-up  L RCC = status post ablation on7/05/23 with "embolization, Dr. Pascal Lux cleared anticoagulation/Eliquis Anemia -Hgb 8 .0 hold ESA secondary to [place] Secondary hyperparathyroidism -CVA/Phos stable continue vitamin D and binder  Ernest Haber, PA-C [facility] Beeper [phone] 08/31/2021,9:41 AM  LOS: 5 days   Labs: Basic Metabolic Panel: Recent Labs  Lab 08/29/21 0209 08/30/21 0304 08/31/21 0518  NA 133* 129* 134*  K 3.5 3.6 3.5  CL 92* 91* 96*  CO2 '27 23 26  '$ GLUCOSE 151* 146* 143*  BUN 34* 40*  17  CREATININE 3.95* 5.12* 2.92*  CALCIUM 8.5* 8.4* 8.1*  PHOS  --  4.0  --    Liver Function Tests: Recent Labs  Lab 08/25/21 1917  AST 21  ALT 23  ALKPHOS 87  BILITOT 0.8  PROT 6.6  ALBUMIN 2.9*   No results for input(s): "LIPASE", "AMYLASE" in the last 168 hours. No results for input(s): "AMMONIA" in the last 168 hours. CBC: Recent Labs  Lab 08/25/21 1917 08/26/21 0447 08/28/21 0457 08/28/21 1610 08/29/21 0209 08/30/21 0304 08/31/21 0518  WBC 13.1*   < > 7.8 8.3 6.6 9.5 10.5  NEUTROABS 11.3*  --   --   --   --  7.5  --   HGB 10.7*   < > 8.7* 8.3* 7.9* 8.0* 8.0*  HCT 33.2*   < > 27.3* 26.3* 24.9* 25.6* 24.1*  MCV 90.7   < > 90.1 88.6 90.5 89.5 87.3  PLT 219   < > 282 292 289 306 351   < > = values in this interval not displayed.   Cardiac Enzymes: No results for input(s): "CKTOTAL", "CKMB", "CKMBINDEX", "TROPONINI" in the last 168 hours. CBG: Recent Labs  Lab 08/30/21 1111 08/30/21 1558 08/30/21 2128 08/31/21 0612 08/31/21 0643  GLUCAP 150* 248* 166* 153* 166*     Medications:  sodium chloride Stopped (08/27/21 0654)   sodium chloride 250 mL (08/31/21 0330)   ferric gluconate (FERRLECIT) IVPB 125 mg (08/31/21 0333)    [START ON 09/04/2021] darbepoetin (ARANESP) injection - DIALYSIS  60 mcg Intravenous Q Tue-HD   escitalopram  20 mg Oral QHS   insulin aspart  0-9 Units Subcutaneous TID WC   insulin aspart  4 Units Subcutaneous TID WC   lipase/protease/amylase  36,000 Units Oral With snacks   lipase/protease/amylase  72,000 Units Oral TID WC   metoprolol succinate  12.5 mg Oral QHS   midodrine  10 mg Oral Q M,W,F-HD   multivitamin  1 tablet Oral QHS   polyethylene glycol  17 g Oral Daily   sevelamer carbonate  1,600 mg Oral TID WC   sodium chloride flush  3 mL Intravenous Q12H     Seen in room

## 2021-08-31 NOTE — Progress Notes (Signed)
Clinic aware that pt will d/c today and have a treatment out-pt tomorrow. Navigator advised by clinic that family is aware of time for appt tomorrow.   Melven Sartorius Renal Navigator [phone]

## 2021-08-31 NOTE — TOC Transition Note (Signed)
Transition of Care [facility]) - CM/SW Discharge Note   Patient Details  Name: Kim Burns MRN: [id number] Date of Birth: 10/13/1935  Transition of Care [facility]) CM/SW Contact:  Zenon Mayo, RN Phone Number: 08/31/2021, 9:52 AM   Clinical Narrative:    Patient is for dc today, she will be starting on eliquis.  NCM spoke with her at the bedside, informed her of the 40.00 copay for refills for eliquis.  She has no needs, she states her daughter will be transporting her home and the Nurse will need to go over the discharge instructions with her because she will not remember.     Final next level of care: Home/Self Care Barriers to Discharge: No Barriers Identified   Patient Goals and CMS Choice Patient states their goals for this hospitalization and ongoing recovery are:: return home   Choice offered to / list presented to : NA  Discharge Placement                       Discharge Plan and Services   Discharge Planning Services: CM Consult Post Acute Care Choice: NA            DME Agency: NA       HH Arranged: NA          Social Determinants of Health (SDOH) Interventions     Readmission Risk Interventions    08/27/2021   10:16 AM  Readmission Risk Prevention Plan  Transportation Screening Complete  PCP or Specialist Appt within 3-5 Days Complete  HRI or [place] Complete  Social Work Consult for [place] Planning/Counseling Complete  Palliative Care Screening Not Applicable  Medication Review [employer]) Complete

## 2021-09-01 DIAGNOSIS — Z992 Dependence on renal dialysis: Secondary | ICD-10-CM | POA: Diagnosis not present

## 2021-09-01 DIAGNOSIS — N2581 Secondary hyperparathyroidism of renal origin: Secondary | ICD-10-CM | POA: Diagnosis not present

## 2021-09-01 DIAGNOSIS — N186 End stage renal disease: Secondary | ICD-10-CM | POA: Diagnosis not present

## 2021-09-01 LAB — BASIC METABOLIC PANEL
Anion gap: 12 (ref 5–15)
BUN: 17 mg/dL (ref 8–23)
CO2: 26 mmol/L (ref 22–32)
Calcium: 8.1 mg/dL — ABNORMAL LOW (ref 8.9–10.3)
Chloride: 96 mmol/L — ABNORMAL LOW (ref 98–111)
Creatinine, Ser: 2.92 mg/dL — ABNORMAL HIGH (ref 0.44–1.00)
GFR, Estimated: 15 mL/min — ABNORMAL LOW (ref >60)
Glucose, Bld: 143 mg/dL — ABNORMAL HIGH (ref 70–99)
Potassium: 3.5 mmol/L (ref 3.5–5.1)
Sodium: 134 mmol/L — ABNORMAL LOW (ref 135–145)

## 2021-09-02 ENCOUNTER — Telehealth: Payer: Self-pay | Admitting: Nephrology

## 2021-09-02 NOTE — Telephone Encounter (Signed)
Transition of care contact from inpatient facility  Date of discharge: 08/31/21 Date of contact: 09/02/21 Method: Phone Spoke to: Patient's daughter  with pt present   Patient contacted to discuss transition of care from recent inpatient hospitalization. Patient was admitted to [facility] from .08/25/21 to 08/31/21.Marland Kitchen with discharge diagnosis of  avf bleed sp angioplasty on fistulogam, hypotension  Midodrine Increased , Afib with RVR, Elevated troponin  with nonobstructive CAD  Medication changes were reviewed  and told daughter can take Midodrine  up to tid if bp continued low at home ans symptomatic   Patient will follow up with his/her outpatient HD [facility]

## 2021-09-03 DIAGNOSIS — N2581 Secondary hyperparathyroidism of renal origin: Secondary | ICD-10-CM | POA: Diagnosis not present

## 2021-09-03 DIAGNOSIS — Z992 Dependence on renal dialysis: Secondary | ICD-10-CM | POA: Diagnosis not present

## 2021-09-03 DIAGNOSIS — N186 End stage renal disease: Secondary | ICD-10-CM | POA: Diagnosis not present

## 2021-09-04 ENCOUNTER — Other Ambulatory Visit: Payer: Self-pay

## 2021-09-04 ENCOUNTER — Other Ambulatory Visit: Payer: Self-pay | Admitting: Interventional Radiology

## 2021-09-04 DIAGNOSIS — N2889 Other specified disorders of kidney and ureter: Secondary | ICD-10-CM

## 2021-09-04 NOTE — Patient Outreach (Signed)
[facility]) Care Management  09/04/2021  Kim Burns [birth year] [id number]   Telephone call to patient for hospital follow up. No answer.  HIPAA compliant voice message left.  Plan: RN CM will attempt patient again within 4 business days and send a letter.  Jone Baseman, RN, MSN [facility] Care Management Care Management Coordinator Direct Line [phone] Toll Free: [phone]  Fax: [phone]

## 2021-09-05 ENCOUNTER — Inpatient Hospital Stay (HOSPITAL_COMMUNITY)
Admission: EM | Admit: 2021-09-05 | Discharge: 2021-09-08 | DRG: 308 | Disposition: A | Discharge status: Home / Self Care | Payer: Medicare PPO | Attending: Family Medicine | Admitting: Family Medicine

## 2021-09-05 ENCOUNTER — Emergency Department (HOSPITAL_COMMUNITY): Payer: Medicare PPO

## 2021-09-05 ENCOUNTER — Encounter (HOSPITAL_COMMUNITY): Payer: Self-pay | Admitting: Emergency Medicine

## 2021-09-05 DIAGNOSIS — E1122 Type 2 diabetes mellitus with diabetic chronic kidney disease: Secondary | ICD-10-CM | POA: Diagnosis present

## 2021-09-05 DIAGNOSIS — Z885 Allergy status to narcotic agent status: Secondary | ICD-10-CM | POA: Diagnosis not present

## 2021-09-05 DIAGNOSIS — D72829 Elevated white blood cell count, unspecified: Secondary | ICD-10-CM | POA: Diagnosis present

## 2021-09-05 DIAGNOSIS — Z888 Allergy status to other drugs, medicaments and biological substances status: Secondary | ICD-10-CM | POA: Diagnosis not present

## 2021-09-05 DIAGNOSIS — Z85528 Personal history of other malignant neoplasm of kidney: Secondary | ICD-10-CM | POA: Diagnosis not present

## 2021-09-05 DIAGNOSIS — Z992 Dependence on renal dialysis: Secondary | ICD-10-CM | POA: Diagnosis not present

## 2021-09-05 DIAGNOSIS — Z79899 Other long term (current) drug therapy: Secondary | ICD-10-CM

## 2021-09-05 DIAGNOSIS — Z9862 Peripheral vascular angioplasty status: Secondary | ICD-10-CM | POA: Diagnosis not present

## 2021-09-05 DIAGNOSIS — Z808 Family history of malignant neoplasm of other organs or systems: Secondary | ICD-10-CM

## 2021-09-05 DIAGNOSIS — Z8673 Personal history of transient ischemic attack (TIA), and cerebral infarction without residual deficits: Secondary | ICD-10-CM

## 2021-09-05 DIAGNOSIS — Z820 Family history of epilepsy and other diseases of the nervous system: Secondary | ICD-10-CM

## 2021-09-05 DIAGNOSIS — Z90710 Acquired absence of both cervix and uterus: Secondary | ICD-10-CM

## 2021-09-05 DIAGNOSIS — E785 Hyperlipidemia, unspecified: Secondary | ICD-10-CM | POA: Diagnosis present

## 2021-09-05 DIAGNOSIS — Z91018 Allergy to other foods: Secondary | ICD-10-CM | POA: Diagnosis not present

## 2021-09-05 DIAGNOSIS — I951 Orthostatic hypotension: Secondary | ICD-10-CM | POA: Diagnosis present

## 2021-09-05 DIAGNOSIS — Z9049 Acquired absence of other specified parts of digestive tract: Secondary | ICD-10-CM

## 2021-09-05 DIAGNOSIS — Z862 Personal history of diseases of the blood and blood-forming organs and certain disorders involving the immune mechanism: Secondary | ICD-10-CM | POA: Diagnosis present

## 2021-09-05 DIAGNOSIS — Z794 Long term (current) use of insulin: Secondary | ICD-10-CM | POA: Diagnosis not present

## 2021-09-05 DIAGNOSIS — I953 Hypotension of hemodialysis: Secondary | ICD-10-CM | POA: Diagnosis present

## 2021-09-05 DIAGNOSIS — I120 Hypertensive chronic kidney disease with stage 5 chronic kidney disease or end stage renal disease: Secondary | ICD-10-CM | POA: Diagnosis not present

## 2021-09-05 DIAGNOSIS — Z8249 Family history of ischemic heart disease and other diseases of the circulatory system: Secondary | ICD-10-CM | POA: Diagnosis not present

## 2021-09-05 DIAGNOSIS — R0602 Shortness of breath: Secondary | ICD-10-CM | POA: Diagnosis not present

## 2021-09-05 DIAGNOSIS — D631 Anemia in chronic kidney disease: Secondary | ICD-10-CM | POA: Diagnosis present

## 2021-09-05 DIAGNOSIS — M109 Gout, unspecified: Secondary | ICD-10-CM | POA: Diagnosis present

## 2021-09-05 DIAGNOSIS — Z87442 Personal history of urinary calculi: Secondary | ICD-10-CM | POA: Diagnosis not present

## 2021-09-05 DIAGNOSIS — I132 Hypertensive heart and chronic kidney disease with heart failure and with stage 5 chronic kidney disease, or end stage renal disease: Secondary | ICD-10-CM | POA: Diagnosis present

## 2021-09-05 DIAGNOSIS — I4891 Unspecified atrial fibrillation: Secondary | ICD-10-CM | POA: Diagnosis not present

## 2021-09-05 DIAGNOSIS — E872 Acidosis, unspecified: Secondary | ICD-10-CM | POA: Diagnosis present

## 2021-09-05 DIAGNOSIS — N189 Chronic kidney disease, unspecified: Secondary | ICD-10-CM | POA: Diagnosis present

## 2021-09-05 DIAGNOSIS — F419 Anxiety disorder, unspecified: Secondary | ICD-10-CM | POA: Diagnosis present

## 2021-09-05 DIAGNOSIS — D649 Anemia, unspecified: Secondary | ICD-10-CM | POA: Diagnosis present

## 2021-09-05 DIAGNOSIS — N186 End stage renal disease: Secondary | ICD-10-CM | POA: Diagnosis present

## 2021-09-05 DIAGNOSIS — N2581 Secondary hyperparathyroidism of renal origin: Secondary | ICD-10-CM | POA: Diagnosis present

## 2021-09-05 DIAGNOSIS — K219 Gastro-esophageal reflux disease without esophagitis: Secondary | ICD-10-CM | POA: Diagnosis present

## 2021-09-05 DIAGNOSIS — E119 Type 2 diabetes mellitus without complications: Secondary | ICD-10-CM

## 2021-09-05 DIAGNOSIS — I4819 Other persistent atrial fibrillation: Secondary | ICD-10-CM | POA: Diagnosis present

## 2021-09-05 DIAGNOSIS — I480 Paroxysmal atrial fibrillation: Secondary | ICD-10-CM | POA: Diagnosis not present

## 2021-09-05 DIAGNOSIS — Z90411 Acquired partial absence of pancreas: Secondary | ICD-10-CM | POA: Diagnosis not present

## 2021-09-05 DIAGNOSIS — C642 Malignant neoplasm of left kidney, except renal pelvis: Secondary | ICD-10-CM | POA: Diagnosis present

## 2021-09-05 DIAGNOSIS — I2510 Atherosclerotic heart disease of native coronary artery without angina pectoris: Secondary | ICD-10-CM | POA: Diagnosis present

## 2021-09-05 DIAGNOSIS — K5730 Diverticulosis of large intestine without perforation or abscess without bleeding: Secondary | ICD-10-CM | POA: Diagnosis not present

## 2021-09-05 DIAGNOSIS — Z823 Family history of stroke: Secondary | ICD-10-CM

## 2021-09-05 DIAGNOSIS — E876 Hypokalemia: Secondary | ICD-10-CM | POA: Diagnosis present

## 2021-09-05 DIAGNOSIS — R531 Weakness: Principal | ICD-10-CM

## 2021-09-05 DIAGNOSIS — N281 Cyst of kidney, acquired: Secondary | ICD-10-CM | POA: Diagnosis not present

## 2021-09-05 DIAGNOSIS — N2889 Other specified disorders of kidney and ureter: Secondary | ICD-10-CM | POA: Diagnosis not present

## 2021-09-05 DIAGNOSIS — Z800 Family history of malignant neoplasm of digestive organs: Secondary | ICD-10-CM

## 2021-09-05 DIAGNOSIS — Z7901 Long term (current) use of anticoagulants: Secondary | ICD-10-CM | POA: Diagnosis not present

## 2021-09-05 DIAGNOSIS — R778 Other specified abnormalities of plasma proteins: Secondary | ICD-10-CM | POA: Diagnosis present

## 2021-09-05 LAB — BRAIN NATRIURETIC PEPTIDE: B Natriuretic Peptide: 2213.9 pg/mL — ABNORMAL HIGH (ref 0.0–100.0)

## 2021-09-05 LAB — URINALYSIS, ROUTINE W REFLEX MICROSCOPIC
Bilirubin Urine: NEGATIVE
Glucose, UA: NEGATIVE mg/dL
Ketones, ur: NEGATIVE mg/dL
Nitrite: NEGATIVE
Protein, ur: 100 mg/dL — AB
Specific Gravity, Urine: 1.017 (ref 1.005–1.030)
pH: 6 (ref 5.0–8.0)

## 2021-09-05 LAB — PROTIME-INR
INR: 1.3 — ABNORMAL HIGH (ref 0.8–1.2)
Prothrombin Time: 16.3 seconds — ABNORMAL HIGH (ref 11.4–15.2)

## 2021-09-05 LAB — COMPREHENSIVE METABOLIC PANEL
ALT: 19 U/L (ref 0–44)
AST: 18 U/L (ref 15–41)
Albumin: 2.3 g/dL — ABNORMAL LOW (ref 3.5–5.0)
Alkaline Phosphatase: 83 U/L (ref 38–126)
Anion gap: 12 (ref 5–15)
BUN: 16 mg/dL (ref 8–23)
CO2: 29 mmol/L (ref 22–32)
Calcium: 8.3 mg/dL — ABNORMAL LOW (ref 8.9–10.3)
Chloride: 96 mmol/L — ABNORMAL LOW (ref 98–111)
Creatinine, Ser: 2.67 mg/dL — ABNORMAL HIGH (ref 0.44–1.00)
GFR, Estimated: 17 mL/min — ABNORMAL LOW (ref >60)
Glucose, Bld: 270 mg/dL — ABNORMAL HIGH (ref 70–99)
Potassium: 3.4 mmol/L — ABNORMAL LOW (ref 3.5–5.1)
Sodium: 137 mmol/L (ref 135–145)
Total Bilirubin: 0.3 mg/dL (ref 0.3–1.2)
Total Protein: 5.8 g/dL — ABNORMAL LOW (ref 6.5–8.1)

## 2021-09-05 LAB — CBC
HCT: 29 % — ABNORMAL LOW (ref 36.0–46.0)
Hemoglobin: 8.8 g/dL — ABNORMAL LOW (ref 12.0–15.0)
MCH: 28.2 pg (ref 26.0–34.0)
MCHC: 30.3 g/dL (ref 30.0–36.0)
MCV: 92.9 fL (ref 80.0–100.0)
Platelets: 586 10*3/uL — ABNORMAL HIGH (ref 150–400)
RBC: 3.12 MIL/uL — ABNORMAL LOW (ref 3.87–5.11)
RDW: 16 % — ABNORMAL HIGH (ref 11.5–15.5)
WBC: 18.1 10*3/uL — ABNORMAL HIGH (ref 4.0–10.5)
nRBC: 0 % (ref 0.0–0.2)

## 2021-09-05 LAB — TROPONIN I (HIGH SENSITIVITY)
Troponin I (High Sensitivity): 32 ng/L — ABNORMAL HIGH (ref <18)
Troponin I (High Sensitivity): 32 ng/L — ABNORMAL HIGH (ref <18)

## 2021-09-05 LAB — LACTIC ACID, PLASMA: Lactic Acid, Venous: 2.2 mmol/L (ref 0.5–1.9)

## 2021-09-05 MED ORDER — LACTATED RINGERS IV BOLUS (SEPSIS)
250.0000 mL | Freq: Once | INTRAVENOUS | Status: AC
  Administered 2021-09-05: 250 mL via INTRAVENOUS

## 2021-09-05 MED ORDER — LACTATED RINGERS IV BOLUS
250.0000 mL | Freq: Once | INTRAVENOUS | Status: AC
  Administered 2021-09-05: 250 mL via INTRAVENOUS

## 2021-09-05 NOTE — ED Provider Triage Note (Signed)
Emergency Medicine Provider Triage Evaluation Note  Kim Burns , a 86 y.o. female  was evaluated in triage.  Pt complains of shortness of breath/dizziness.  Patient states that symptoms have been present since this morning.  She denies any overt chest pain.  States that shortness of breath is worse with physical activity.  She feels most dizzy when she gets up from a seated position.  She has both the symptoms are new for her.  Patient has known ESRD on dialysis.  She had dialysis earlier today which did not resolve her symptoms.  She has history of left renal mass of which she had a microwave ablation and surgical procedure performed earlier this month.  She states that she has had intermittent atrial fibrillation since this procedure.  She states she has had a myocardial infarction as well as 2 TIAs since the beginning of this month.  She is not currently anticoagulated.  Denies fever, chills, night sweats, chest pain, abdominal pain, nausea/vomiting/diarrhea, urinary symptoms, change in bowel habits, hematochezia, melena.  Review of Systems  Positive: See above Negative:   Physical Exam  BP (!) 91/57 (BP Location: Right Arm)   Pulse (!) 113   Temp 97.7 F (36.5 C) (Oral)   Resp 20   SpO2 98%  Gen:   Awake, no distress   Resp:  Normal effort  MSK:   Moves extremities without difficulty  Other:  No rales, rhonchi, wheeze auscultated on lung exam.  No lower extremity edema.  Medical Decision Making  Medically screening exam initiated at 6:45 PM.  Appropriate orders placed.  TOSHIKO KEMLER was informed that the remainder of the evaluation will be completed by another provider, this initial triage assessment does not replace that evaluation, and the importance of remaining in the ED until their evaluation is complete.     Wilnette Kales, [place] 09/05/21 1849

## 2021-09-05 NOTE — ED Provider Notes (Signed)
[facility] EMERGENCY DEPARTMENT Provider Note   CSN: [id number] Arrival date & time: 09/05/21  1736     History  Chief Complaint  Patient presents with   Weakness    Kim Burns is a 86 y.o. female.   Weakness Associated symptoms: dizziness and shortness of breath   Patient presents for multiple complaints.  Medical history includes T2DM, left renal cell carcinoma, GERD, CVA, ESRD, atrial fibrillation, CAD.  2 weeks ago, she underwent ablation and embolization of her right kidney renal cell carcinoma.  During that procedure, she had new onset A-fib with RVR.  She was admitted to the hospital.  She had spontaneous conversion to normal sinus rhythm after initiation of Cardizem.  She was found to have small strokes while admitted.  She was discharged on metoprolol, Eliquis, and midodrine.  Today, patient reports recent fatigue, generalized weakness, shortness of breath and dizziness.  Onset was this morning when she woke up.  Dyspnea is worsened with exertion.  Dizziness is worsened with standing up.  She did undergo dialysis session earlier today.  After that, she did continue to have a nap.  When she woke up from a nap, she thought she felt well but when she stood up, she had recurrence of shortness of breath and lightheadedness.  Patient states that, at baseline, she is quite active.  Her family reports that she has had decreased activity since her procedure 2 weeks ago but this seems to have worsened today.  Patient denies any areas of discomfort currently or earlier today.  During her dialysis session, she was placed on supplemental oxygen.  This was the first time she has required that.  At rest, she denies any dyspnea.     Home Medications Prior to Admission medications   Medication Sig Start Date End Date Taking? Authorizing Provider  acetaminophen (TYLENOL 8 HOUR ARTHRITIS PAIN) 650 MG CR tablet Take 1,300 mg by mouth 2 (two) times daily as needed for pain.    Yes [provider]  apixaban (ELIQUIS) 2.5 MG TABS tablet Take 1 tablet (2.5 mg total) by mouth 2 (two) times daily. 08/30/21 09/29/21 Yes Vashti Hey, MD  Cholecalciferol (VITAMIN D-3 PO) Take 1 capsule by mouth every Monday, Wednesday, and Friday with hemodialysis.   Yes [provider]  colestipol (COLESTID) 1 g tablet Take 1 g by mouth See admin instructions. 1 tablet (1g) prior to dialysis M-W-F + 1 tablet daily PRN diarrhea   Yes [provider]  diclofenac sodium (VOLTAREN) 1 % GEL Apply 2 g topically 4 (four) times daily as needed (for hand pain).    Yes [provider]  diphenhydramine-acetaminophen (TYLENOL PM) 25-500 MG TABS tablet Take 1 tablet by mouth at bedtime as needed (sleep, pain).   Yes [provider]  escitalopram (LEXAPRO) 20 MG tablet Take 20 mg by mouth at bedtime.   Yes [provider]  insulin aspart (NOVOLOG FLEXPEN) 100 UNIT/ML FlexPen Inject 10 Units into the skin 3 (three) times daily as needed (CBG > 200).   Yes [provider]  lidocaine-prilocaine (EMLA) cream Apply 1 Application topically every Monday, Wednesday, and Friday with hemodialysis.   Yes [provider]  lipase/protease/amylase (CREON) 36000 UNITS CPEP capsule Take 1 capsule (36,000 Units total) by mouth with snacks. Patient taking differently: Take 36,000-72,000 Units by mouth See admin instructions. 2 capsules(72000 units) TID with meals AND 1 capsule(36000 units) with snacks 08/30/21 11/28/21 Yes Chatterjee, Kyra Searles, MD  metoprolol succinate (  TOPROL-XL) 25 MG 24 hr tablet Take 0.5 tablets (12.5 mg total) by mouth at bedtime. 08/30/21 09/29/21 Yes Vashti Hey, MD  midodrine (PROAMATINE) 10 MG tablet Take 1 tablet (10 mg total) by mouth daily. Patient taking differently: Take 10 mg by mouth in the morning. 08/30/21 09/29/21 Yes Vashti Hey, MD  oxyCODONE-acetaminophen (PERCOCET) 5-325 MG tablet  Take 1 tablet by mouth every 6 (six) hours as needed for severe pain. 08/23/21  Yes Lura Em, PA  sevelamer carbonate (RENVELA) 800 MG tablet Take 1,600 mg by mouth 3 (three) times daily with meals.   Yes [provider]      Allergies    Lopid [gemfibrozil], Lotemax [loteprednol etabonate], Statins, Norco [hydrocodone-acetaminophen], Other, Tomato, Vibra-tab [doxycycline], Codeine, and Hydrocodone-acetaminophen    Review of Systems   Review of Systems  Constitutional:  Positive for activity change and fatigue.  Respiratory:  Positive for shortness of breath.   Neurological:  Positive for dizziness, weakness (Generalized) and light-headedness.  All other systems reviewed and are negative.   Physical Exam Updated Vital Signs BP 131/72 (BP Location: Right Arm)   Pulse (!) 116   Temp 97.9 F (36.6 C) (Oral)   Resp 17   Ht '5\' 2"'$  (1.575 m)   Wt 59.3 kg   SpO2 100%   BMI 23.91 kg/m  Physical Exam Vitals and nursing note reviewed.  Constitutional:      General: She is not in acute distress.    Appearance: Normal appearance. She is well-developed. She is not toxic-appearing or diaphoretic.  HENT:     Head: Normocephalic and atraumatic.     Right Ear: External ear normal.     Left Ear: External ear normal.     Nose: Nose normal.     Mouth/Throat:     Mouth: Mucous membranes are moist.     Pharynx: Oropharynx is clear.  Eyes:     Extraocular Movements: Extraocular movements intact.     Conjunctiva/sclera: Conjunctivae normal.  Neck:     Vascular: JVD present.  Cardiovascular:     Rate and Rhythm: Normal rate and regular rhythm.     Heart sounds: No murmur heard. Pulmonary:     Effort: Pulmonary effort is normal. No respiratory distress.     Breath sounds: Normal breath sounds. No wheezing or rales.  Chest:     Chest wall: No tenderness.  Abdominal:     General: Abdomen is flat.     Palpations: Abdomen is soft.     Tenderness: There is no abdominal  tenderness.  Musculoskeletal:        General: No swelling. Normal range of motion.     Cervical back: Normal range of motion and neck supple.     Right lower leg: No edema.     Left lower leg: No edema.  Skin:    General: Skin is warm and dry.     Capillary Refill: Capillary refill takes less than 2 seconds.     Coloration: Skin is not jaundiced or pale.  Neurological:     General: No focal deficit present.     Mental Status: She is alert and oriented to person, place, and time.     Cranial Nerves: No cranial nerve deficit.     Sensory: No sensory deficit.     Motor: No weakness.     Coordination: Coordination normal.  Psychiatric:        Mood and Affect: Mood normal.  Behavior: Behavior normal.        Thought Content: Thought content normal.        Judgment: Judgment normal.     ED Results / Procedures / Treatments   Labs (all labs ordered are listed, but only abnormal results are displayed) Labs Reviewed  URINE CULTURE - Abnormal; Notable for the following components:      Result Value   Culture MULTIPLE SPECIES PRESENT, SUGGEST RECOLLECTION (*)    All other components within normal limits  CBC - Abnormal; Notable for the following components:   WBC 18.1 (*)    RBC 3.12 (*)    Hemoglobin 8.8 (*)    HCT 29.0 (*)    RDW 16.0 (*)    Platelets 586 (*)    All other components within normal limits  URINALYSIS, ROUTINE W REFLEX MICROSCOPIC - Abnormal; Notable for the following components:   APPearance CLOUDY (*)    Hgb urine dipstick MODERATE (*)    Protein, ur 100 (*)    Leukocytes,Ua TRACE (*)    Bacteria, UA RARE (*)    Non Squamous Epithelial 0-5 (*)    All other components within normal limits  COMPREHENSIVE METABOLIC PANEL - Abnormal; Notable for the following components:   Potassium 3.4 (*)    Chloride 96 (*)    Glucose, Bld 270 (*)    Creatinine, Ser 2.67 (*)    Calcium 8.3 (*)    Total Protein 5.8 (*)    Albumin 2.3 (*)    GFR, Estimated 17 (*)     All other components within normal limits  LACTIC ACID, PLASMA - Abnormal; Notable for the following components:   Lactic Acid, Venous 2.2 (*)    All other components within normal limits  LACTIC ACID, PLASMA - Abnormal; Notable for the following components:   Lactic Acid, Venous 2.5 (*)    All other components within normal limits  PROTIME-INR - Abnormal; Notable for the following components:   Prothrombin Time 16.3 (*)    INR 1.3 (*)    All other components within normal limits  BRAIN NATRIURETIC PEPTIDE - Abnormal; Notable for the following components:   B Natriuretic Peptide 2,213.9 (*)    All other components within normal limits  IRON AND TIBC - Abnormal; Notable for the following components:   TIBC 197 (*)    All other components within normal limits  FERRITIN - Abnormal; Notable for the following components:   Ferritin 713 (*)    All other components within normal limits  RETICULOCYTES - Abnormal; Notable for the following components:   RBC. 2.91 (*)    Immature Retic Fract 27.7 (*)    All other components within normal limits  HEMOGLOBIN A1C - Abnormal; Notable for the following components:   Hgb A1c MFr Bld 6.2 (*)    All other components within normal limits  CBC - Abnormal; Notable for the following components:   WBC 15.7 (*)    RBC 2.86 (*)    Hemoglobin 8.1 (*)    HCT 26.2 (*)    RDW 16.3 (*)    Platelets 503 (*)    All other components within normal limits  RENAL FUNCTION PANEL - Abnormal; Notable for the following components:   Glucose, Bld 138 (*)    BUN 31 (*)    Creatinine, Ser 4.39 (*)    Calcium 8.7 (*)    Albumin 2.1 (*)    GFR, Estimated 9 (*)    All other components within  normal limits  CBG MONITORING, ED - Abnormal; Notable for the following components:   Glucose-Capillary 153 (*)    All other components within normal limits  CBG MONITORING, ED - Abnormal; Notable for the following components:   Glucose-Capillary 126 (*)    All other  components within normal limits  CBG MONITORING, ED - Abnormal; Notable for the following components:   Glucose-Capillary 144 (*)    All other components within normal limits  CBG MONITORING, ED - Abnormal; Notable for the following components:   Glucose-Capillary 131 (*)    All other components within normal limits  TROPONIN I (HIGH SENSITIVITY) - Abnormal; Notable for the following components:   Troponin I (High Sensitivity) 32 (*)    All other components within normal limits  TROPONIN I (HIGH SENSITIVITY) - Abnormal; Notable for the following components:   Troponin I (High Sensitivity) 32 (*)    All other components within normal limits  CULTURE, BLOOD (ROUTINE X 2)  CULTURE, BLOOD (ROUTINE X 2)  VITAMIN B12  FOLATE  CBC  CBG MONITORING, ED    EKG None  Radiology CT ABDOMEN PELVIS WO CONTRAST  Result Date: 09/06/2021 CLINICAL DATA:  Kidney cancer, status post ablation. Shortness of breath and dizziness. EXAM: CT ABDOMEN AND PELVIS WITHOUT CONTRAST TECHNIQUE: Multidetector CT imaging of the abdomen and pelvis was performed following the standard protocol without IV contrast. RADIATION DOSE REDUCTION: This exam was performed according to the departmental dose-optimization program which includes automated exposure control, adjustment of the mA and/or kV according to patient size and/or use of iterative reconstruction technique. COMPARISON:  08/25/2021. FINDINGS: Lower chest: The heart is normal in size and there is a trace pericardial effusion. Coronary artery calcifications are noted. There is a small pleural effusion on the left. Atelectasis is noted at the lung bases bilaterally. Hepatobiliary: No focal liver abnormality is seen. Status post cholecystectomy. No biliary dilatation. Pancreas: The pancreas is not well delineated due to lack of IV contrast. The patient is status post Whipple procedure by history with pancreatic atrophy. Spleen: Normal in size without focal abnormality.  Adrenals/Urinary Tract: No adrenal nodule or mass. Multiple cysts and hyperdense lesions are present in the kidneys bilaterally and not well evaluated due to lack of IV contrast. The hyperdense lesions in the left kidney are new from the previous exam. A previously described enhancing solid lesion in the medial aspect of the lower pole of the left kidney has undergone recent ablation and coil embolization by history. Increased perinephric fat stranding is noted on the left, possible edema versus blood products. Evaluation for residual neoplasm is limited due to lack of IV contrast. A few renal calculi are noted bilaterally. No hydronephrosis. The bladder is decompressed. Stomach/Bowel: Postsurgical changes are noted in the upper abdomen, consistent with Whipple procedure. Appendix is not seen. No evidence of bowel wall thickening, distention, or inflammatory changes. No free air or pneumatosis. Diverticula are noted along the sigmoid colon without evidence of diverticulitis. Vascular/Lymphatic: Aortic atherosclerosis. No enlarged abdominal or pelvic lymph nodes. Reproductive: Status post hysterectomy. No adnexal masses. Other: Multifocal fat containing hernias in the anterior abdominal wall in the midline superior to the umbilicus. No abdominopelvic ascites. Musculoskeletal: Degenerative changes are present in the thoracolumbar spine. No acute or suspicious osseous abnormality. IMPRESSION: 1. Status post renal mass ablation with embolization with stable mass in the medial aspect of the left kidney. Evaluation is limited due to lack of IV contrast. Increased perinephric fat stranding on the left may represent edema  or blood products. New hyperdense lesions are noted in the left kidney suggesting hemorrhagic or proteinaceous cysts. 2. Small left pleural effusion with atelectasis at the lung bases. 3. Aortic atherosclerosis. 4. Remaining incidental findings as described above. Electronically Signed   By: Brett Fairy  M.D.   On: 09/06/2021 03:52   DG Chest 2 View  Result Date: 09/05/2021 CLINICAL DATA:  Shortness of breath EXAM: CHEST - 2 VIEW COMPARISON:  11/12/2016 FINDINGS: The heart size and mediastinal contours are within normal limits. Both lungs are clear. The visualized skeletal structures are unremarkable. IMPRESSION: No active cardiopulmonary disease. Electronically Signed   By: Ulyses Jarred M.D.   On: 09/05/2021 19:39    Procedures Procedures    Medications Ordered in ED Medications  midodrine (PROAMATINE) tablet 10 mg (10 mg Oral Given 09/07/21 0955)  sodium chloride flush (NS) 0.9 % injection 3 mL (3 mLs Intravenous Not Given 09/07/21 1042)  sodium chloride flush (NS) 0.9 % injection 3 mL (3 mLs Intravenous Given 09/07/21 1008)  sodium chloride flush (NS) 0.9 % injection 3 mL (has no administration in time range)  0.9 %  sodium chloride infusion (has no administration in time range)  acetaminophen (TYLENOL) tablet 650 mg (650 mg Oral Given 09/07/21 0609)    Or  acetaminophen (TYLENOL) suppository 650 mg ( Rectal See Alternative 09/07/21 0609)  polyethylene glycol (MIRALAX / GLYCOLAX) packet 17 g (has no administration in time range)  folic acid (FOLVITE) tablet 1 mg (1 mg Oral Given 09/07/21 0954)  thiamine tablet 100 mg (100 mg Oral Given 09/07/21 0956)  levalbuterol (XOPENEX) nebulizer solution 0.63 mg (has no administration in time range)  insulin aspart (novoLOG) injection 0-6 Units ( Subcutaneous Not Given 09/07/21 0736)  insulin aspart (novoLOG) injection 10 Units (10 Units Subcutaneous Patient Refused/Not Given 09/07/21 0801)  apixaban (ELIQUIS) tablet 2.5 mg (2.5 mg Oral Given 09/07/21 1007)  colestipol (COLESTID) tablet 1 g (1 g Oral Given 09/07/21 0953)  escitalopram (LEXAPRO) tablet 20 mg (20 mg Oral Given 09/06/21 2233)  metoprolol succinate (TOPROL-XL) 24 hr tablet 12.5 mg (12.5 mg Oral Given 09/07/21 0957)  multivitamin with minerals tablet 1 tablet (1 tablet Oral Given 09/06/21 2234)   sevelamer carbonate (RENVELA) tablet 800 mg (800 mg Oral Given 09/07/21 0745)  diclofenac Sodium (VOLTAREN) 1 % topical gel 4 g (4 g Topical Given 09/07/21 1041)  oxyCODONE (Oxy IR/ROXICODONE) immediate release tablet 2.5 mg (has no administration in time range)  calcitRIOL (ROCALTROL) capsule 1 mcg (has no administration in time range)  lipase/protease/amylase (CREON) capsule 72,000 Units (72,000 Units Oral Given 09/07/21 0745)  lipase/protease/amylase (CREON) capsule 36,000 Units (has no administration in time range)  Chlorhexidine Gluconate Cloth 2 % PADS 6 each (0 each Topical Hold 09/07/21 0850)  pentafluoroprop-tetrafluoroeth (GEBAUERS) aerosol 1 Application (has no administration in time range)  lidocaine (PF) (XYLOCAINE) 1 % injection 5 mL (has no administration in time range)  lidocaine-prilocaine (EMLA) cream 1 Application (has no administration in time range)  heparin injection 1,000 Units (has no administration in time range)  alteplase (CATHFLO ACTIVASE) injection 2 mg (has no administration in time range)  melatonin tablet 3 mg (has no administration in time range)  lactated ringers bolus 250 mL (0 mLs Intravenous Stopped 09/06/21 0151)  lactated ringers bolus 250 mL (0 mLs Intravenous Stopped 09/06/21 0151)  vancomycin (VANCOCIN) 1-5 GM/200ML-% IVPB (0 mg  Stopped 09/06/21 0314)  piperacillin-tazobactam (ZOSYN) 3.375 GM/50ML IVPB (0 g  Stopped 09/06/21 0502)  sodium chloride 0.9 % bolus  500 mL (0 mLs Intravenous Stopped 09/06/21 0314)    ED Course/ Medical Decision Making/ A&P                           Medical Decision Making Amount and/or Complexity of Data Reviewed Labs: ordered. Radiology: ordered.  Risk Decision regarding hospitalization.   This patient presents to the ED for concern of multiple complaints, this involves an extensive number of treatment options, and is a complaint that carries with it a high risk of complications and morbidity.  The differential diagnosis  includes dehydration, CHF, infection, metabolic derangements, arrhythmia, anemia   Co morbidities that complicate the patient evaluation  T2DM, left renal cell carcinoma, GERD, CVA, ESRD, atrial fibrillation, CAD   Additional history obtained:  Additional history obtained from patient's family External records from outside source obtained and reviewed including EMR   Lab Tests:  I Ordered, and personally interpreted labs.  The pertinent results include: Baseline anemia.  A new leukocytosis is present.  Electrolytes are grossly normal.  Troponin only mildly elevated, consistent with ESRD.  She does have a mildly elevated lactic acid of 2.2.  Her BNP is markedly elevated with no priors for comparison.  She does have a recent echocardiogram that shows grade 3 diastolic dysfunction and dilated atria.  Urinalysis is equivocal.   Imaging Studies ordered:  I ordered imaging studies including chest x-ray I independently visualized and interpreted imaging which showed no acute findings I agree with the radiologist interpretation   Cardiac Monitoring: / EKG:  The patient was maintained on a cardiac monitor.  I personally viewed and interpreted the cardiac monitored which showed an underlying rhythm of: Atrial fibrillation  Problem List / ED Course / Critical interventions / Medication management  Patient is a pleasant 86 year old female who presents from home for generalized weakness, exertional shortness of breath, near syncopal symptoms with standing, and fatigue.  She is active at baseline.  She has had a slight decrease in activity since her kidney embolization 2 weeks ago.  Her worsened symptoms began this morning.  On arrival in the ED, patient is tachycardic with soft blood pressures.  Gentle IV fluids were ordered and vital signs did improve.  On exam, she is alert and oriented.  She denies any areas of discomfort.  She does not have any peripheral edema.  Her lungs are clear to  auscultation.  No cardiac murmurs are appreciated.  Abdomen is soft and nontender.  She has no focal neurologic deficits.  Etiology of symptoms is indeterminate.  Lab work was ordered.  Lab results are notable for a leukocytosis and a slightly elevated lactic acid.  This could be evidence of infection, or simply could be secondary to dehydration/stress demargination.  Her anemia is baseline and her electrolytes are normal.  Troponin is slightly elevated consistent with ESRD with no increase on delta.  BNP is elevated without priors for comparison.  On review of chart, she did undergo recent echocardiogram which showed grade 3 diastolic dysfunction and dilated atria.  Elevated BNP may be baseline for her.  Urinalysis is equivocal.  There is no clear source of infection.  Given her recent kidney procedure, CT of abdomen pelvis was ordered.  Care of patient was signed out to oncoming ED provider. I ordered medication including IV fluids for gentle hydration Reevaluation of the patient after these medicines showed that the patient improved I have reviewed the patients home medicines and have made adjustments as  needed   Social Determinants of Health:  Has access to outpatient care         Final Clinical Impression(s) / ED Diagnoses Final diagnoses:  Generalized weakness    Rx / DC Orders ED Discharge Orders     None         Godfrey Pick, MD 09/07/21 [phone]

## 2021-09-05 NOTE — ED Triage Notes (Signed)
Patient here with complaint of generalized weakness that was present on waking this morning. Patient states she completed dialysis today and felt even more weak (access in left arm). Patient is alert, oriented, and in no apparent distress at this time.

## 2021-09-06 ENCOUNTER — Ambulatory Visit (HOSPITAL_COMMUNITY): Admission: RE | Admit: 2021-09-06 | Payer: Medicare PPO | Source: Home / Self Care | Admitting: Orthopedic Surgery

## 2021-09-06 ENCOUNTER — Encounter (HOSPITAL_COMMUNITY): Admission: RE | Payer: Self-pay | Source: Home / Self Care

## 2021-09-06 ENCOUNTER — Emergency Department (HOSPITAL_COMMUNITY): Payer: Medicare PPO

## 2021-09-06 ENCOUNTER — Other Ambulatory Visit: Payer: Self-pay

## 2021-09-06 DIAGNOSIS — Z91018 Allergy to other foods: Secondary | ICD-10-CM | POA: Diagnosis not present

## 2021-09-06 DIAGNOSIS — D631 Anemia in chronic kidney disease: Secondary | ICD-10-CM | POA: Diagnosis present

## 2021-09-06 DIAGNOSIS — Z8249 Family history of ischemic heart disease and other diseases of the circulatory system: Secondary | ICD-10-CM | POA: Diagnosis not present

## 2021-09-06 DIAGNOSIS — Z9862 Peripheral vascular angioplasty status: Secondary | ICD-10-CM | POA: Diagnosis not present

## 2021-09-06 DIAGNOSIS — I953 Hypotension of hemodialysis: Secondary | ICD-10-CM | POA: Diagnosis present

## 2021-09-06 DIAGNOSIS — N186 End stage renal disease: Secondary | ICD-10-CM | POA: Diagnosis present

## 2021-09-06 DIAGNOSIS — I951 Orthostatic hypotension: Secondary | ICD-10-CM | POA: Diagnosis present

## 2021-09-06 DIAGNOSIS — I4891 Unspecified atrial fibrillation: Secondary | ICD-10-CM | POA: Diagnosis not present

## 2021-09-06 DIAGNOSIS — Z85528 Personal history of other malignant neoplasm of kidney: Secondary | ICD-10-CM | POA: Diagnosis not present

## 2021-09-06 DIAGNOSIS — I132 Hypertensive heart and chronic kidney disease with heart failure and with stage 5 chronic kidney disease, or end stage renal disease: Secondary | ICD-10-CM | POA: Diagnosis present

## 2021-09-06 DIAGNOSIS — Z885 Allergy status to narcotic agent status: Secondary | ICD-10-CM | POA: Diagnosis not present

## 2021-09-06 DIAGNOSIS — I2510 Atherosclerotic heart disease of native coronary artery without angina pectoris: Secondary | ICD-10-CM | POA: Diagnosis present

## 2021-09-06 DIAGNOSIS — Z7901 Long term (current) use of anticoagulants: Secondary | ICD-10-CM | POA: Diagnosis not present

## 2021-09-06 DIAGNOSIS — E1122 Type 2 diabetes mellitus with diabetic chronic kidney disease: Secondary | ICD-10-CM | POA: Diagnosis present

## 2021-09-06 DIAGNOSIS — N2581 Secondary hyperparathyroidism of renal origin: Secondary | ICD-10-CM | POA: Diagnosis present

## 2021-09-06 DIAGNOSIS — D72829 Elevated white blood cell count, unspecified: Secondary | ICD-10-CM | POA: Diagnosis present

## 2021-09-06 DIAGNOSIS — R531 Weakness: Secondary | ICD-10-CM | POA: Diagnosis present

## 2021-09-06 DIAGNOSIS — Z8673 Personal history of transient ischemic attack (TIA), and cerebral infarction without residual deficits: Secondary | ICD-10-CM | POA: Diagnosis not present

## 2021-09-06 DIAGNOSIS — Z992 Dependence on renal dialysis: Secondary | ICD-10-CM | POA: Diagnosis not present

## 2021-09-06 DIAGNOSIS — E872 Acidosis, unspecified: Secondary | ICD-10-CM | POA: Diagnosis present

## 2021-09-06 DIAGNOSIS — Z90411 Acquired partial absence of pancreas: Secondary | ICD-10-CM | POA: Diagnosis not present

## 2021-09-06 DIAGNOSIS — I4819 Other persistent atrial fibrillation: Secondary | ICD-10-CM | POA: Diagnosis present

## 2021-09-06 DIAGNOSIS — K219 Gastro-esophageal reflux disease without esophagitis: Secondary | ICD-10-CM | POA: Diagnosis present

## 2021-09-06 DIAGNOSIS — Z9049 Acquired absence of other specified parts of digestive tract: Secondary | ICD-10-CM | POA: Diagnosis not present

## 2021-09-06 DIAGNOSIS — Z888 Allergy status to other drugs, medicaments and biological substances status: Secondary | ICD-10-CM | POA: Diagnosis not present

## 2021-09-06 DIAGNOSIS — Z87442 Personal history of urinary calculi: Secondary | ICD-10-CM | POA: Diagnosis not present

## 2021-09-06 DIAGNOSIS — Z794 Long term (current) use of insulin: Secondary | ICD-10-CM | POA: Diagnosis not present

## 2021-09-06 DIAGNOSIS — I480 Paroxysmal atrial fibrillation: Secondary | ICD-10-CM | POA: Diagnosis not present

## 2021-09-06 LAB — HEMOGLOBIN A1C
Hgb A1c MFr Bld: 6.2 % — ABNORMAL HIGH (ref 4.8–5.6)
Mean Plasma Glucose: 131.24 mg/dL

## 2021-09-06 LAB — CBG MONITORING, ED
Glucose-Capillary: 153 mg/dL — ABNORMAL HIGH (ref 70–99)
Glucose-Capillary: 83 mg/dL (ref 70–99)

## 2021-09-06 LAB — RETICULOCYTES
Immature Retic Fract: 27.7 % — ABNORMAL HIGH (ref 2.3–15.9)
RBC.: 2.91 MIL/uL — ABNORMAL LOW (ref 3.87–5.11)
Retic Count, Absolute: 82.6 10*3/uL (ref 19.0–186.0)
Retic Ct Pct: 2.8 % (ref 0.4–3.1)

## 2021-09-06 LAB — IRON AND TIBC
Iron: 45 ug/dL (ref 28–170)
Saturation Ratios: 23 % (ref 10.4–31.8)
TIBC: 197 ug/dL — ABNORMAL LOW (ref 250–450)
UIBC: 152 ug/dL

## 2021-09-06 LAB — VITAMIN B12: Vitamin B-12: 357 pg/mL (ref 180–914)

## 2021-09-06 LAB — FERRITIN: Ferritin: 713 ng/mL — ABNORMAL HIGH (ref 11–307)

## 2021-09-06 LAB — LACTIC ACID, PLASMA: Lactic Acid, Venous: 2.5 mmol/L (ref 0.5–1.9)

## 2021-09-06 LAB — FOLATE: Folate: 14.7 ng/mL (ref >5.9)

## 2021-09-06 SURGERY — CARPAL TUNNEL RELEASE
Anesthesia: Choice | Laterality: Left

## 2021-09-06 MED ORDER — SODIUM CHLORIDE 0.9% FLUSH
3.0000 mL | INTRAVENOUS | Status: DC | PRN
Start: 2021-09-06 — End: 2021-09-08

## 2021-09-06 MED ORDER — THIAMINE HCL 100 MG PO TABS
100.0000 mg | ORAL_TABLET | Freq: Every day | ORAL | Status: DC
  Administered 2021-09-06 – 2021-09-08 (×3): 100 mg via ORAL
  Filled 2021-09-06 (×3): qty 1

## 2021-09-06 MED ORDER — ACETAMINOPHEN 325 MG PO TABS
650.0000 mg | ORAL_TABLET | Freq: Four times a day (QID) | ORAL | Status: DC | PRN
  Administered 2021-09-07: 650 mg via ORAL
  Filled 2021-09-06: qty 2

## 2021-09-06 MED ORDER — PANCRELIPASE (LIP-PROT-AMYL) 36000-114000 UNITS PO CPEP
36000.0000 [IU] | ORAL_CAPSULE | Freq: Three times a day (TID) | ORAL | Status: DC
  Filled 2021-09-06: qty 2

## 2021-09-06 MED ORDER — CALCITRIOL 0.5 MCG PO CAPS
1.0000 ug | ORAL_CAPSULE | ORAL | Status: DC
  Administered 2021-09-07: 1 ug via ORAL
  Filled 2021-09-06 (×2): qty 2

## 2021-09-06 MED ORDER — INSULIN ASPART 100 UNIT/ML IJ SOLN
0.0000 [IU] | Freq: Three times a day (TID) | INTRAMUSCULAR | Status: DC
  Administered 2021-09-06: 1 [IU] via SUBCUTANEOUS

## 2021-09-06 MED ORDER — PANCRELIPASE (LIP-PROT-AMYL) 12000-38000 UNITS PO CPEP
36000.0000 [IU] | ORAL_CAPSULE | ORAL | Status: DC | PRN
Start: 2021-09-06 — End: 2021-09-08

## 2021-09-06 MED ORDER — SEVELAMER CARBONATE 800 MG PO TABS
800.0000 mg | ORAL_TABLET | Freq: Three times a day (TID) | ORAL | Status: DC
  Administered 2021-09-06 – 2021-09-08 (×6): 800 mg via ORAL
  Filled 2021-09-06 (×6): qty 1

## 2021-09-06 MED ORDER — ADULT MULTIVITAMIN W/MINERALS CH
1.0000 | ORAL_TABLET | Freq: Every day | ORAL | Status: DC
  Administered 2021-09-06 – 2021-09-07 (×2): 1 via ORAL
  Filled 2021-09-06 (×2): qty 1

## 2021-09-06 MED ORDER — INSULIN ASPART 100 UNIT/ML IJ SOLN
10.0000 [IU] | Freq: Three times a day (TID) | INTRAMUSCULAR | Status: DC
Start: 2021-09-06 — End: 2021-09-08
  Administered 2021-09-06: 10 [IU] via SUBCUTANEOUS

## 2021-09-06 MED ORDER — SODIUM CHLORIDE 0.9% FLUSH
3.0000 mL | Freq: Two times a day (BID) | INTRAVENOUS | Status: DC
  Administered 2021-09-06 – 2021-09-08 (×4): 3 mL via INTRAVENOUS

## 2021-09-06 MED ORDER — SODIUM CHLORIDE 0.9% FLUSH
3.0000 mL | Freq: Two times a day (BID) | INTRAVENOUS | Status: DC
  Administered 2021-09-06 – 2021-09-08 (×5): 3 mL via INTRAVENOUS

## 2021-09-06 MED ORDER — POLYETHYLENE GLYCOL 3350 17 G PO PACK
17.0000 g | PACK | Freq: Every day | ORAL | Status: DC | PRN
Start: 2021-09-06 — End: 2021-09-08

## 2021-09-06 MED ORDER — ESCITALOPRAM OXALATE 10 MG PO TABS
20.0000 mg | ORAL_TABLET | Freq: Every day | ORAL | Status: DC
  Administered 2021-09-06 – 2021-09-07 (×2): 20 mg via ORAL
  Filled 2021-09-06 (×2): qty 2

## 2021-09-06 MED ORDER — SODIUM CHLORIDE 0.9 % IV BOLUS
500.0000 mL | Freq: Once | INTRAVENOUS | Status: AC
  Administered 2021-09-06: 500 mL via INTRAVENOUS

## 2021-09-06 MED ORDER — PIPERACILLIN-TAZOBACTAM 3.375 G IVPB
INTRAVENOUS | Status: AC
  Administered 2021-09-06: 3.375 g
  Filled 2021-09-06: qty 50

## 2021-09-06 MED ORDER — PANCRELIPASE (LIP-PROT-AMYL) 36000-114000 UNITS PO CPEP
36000.0000 [IU] | ORAL_CAPSULE | Freq: Three times a day (TID) | ORAL | Status: DC
  Administered 2021-09-06: 36000 [IU] via ORAL
  Filled 2021-09-06 (×2): qty 1

## 2021-09-06 MED ORDER — METOPROLOL SUCCINATE ER 25 MG PO TB24
12.5000 mg | ORAL_TABLET | Freq: Every day | ORAL | Status: DC
  Administered 2021-09-06 – 2021-09-07 (×2): 12.5 mg via ORAL
  Filled 2021-09-06 (×2): qty 1

## 2021-09-06 MED ORDER — ACETAMINOPHEN 650 MG RE SUPP: 650.0000 mg | Freq: Four times a day (QID) | RECTAL | Status: DC | PRN

## 2021-09-06 MED ORDER — FOLIC ACID 1 MG PO TABS
1.0000 mg | ORAL_TABLET | Freq: Every day | ORAL | Status: DC
  Administered 2021-09-06 – 2021-09-08 (×3): 1 mg via ORAL
  Filled 2021-09-06 (×3): qty 1

## 2021-09-06 MED ORDER — DICLOFENAC SODIUM 1 % EX GEL
4.0000 g | Freq: Four times a day (QID) | CUTANEOUS | Status: DC
  Administered 2021-09-06 – 2021-09-07 (×3): 4 g via TOPICAL
  Filled 2021-09-06: qty 100

## 2021-09-06 MED ORDER — SODIUM CHLORIDE 0.9 % IV SOLN
250.0000 mL | INTRAVENOUS | Status: DC | PRN
Start: 2021-09-06 — End: 2021-09-08

## 2021-09-06 MED ORDER — VANCOMYCIN HCL IN DEXTROSE 1-5 GM/200ML-% IV SOLN
INTRAVENOUS | Status: AC
  Administered 2021-09-06: 1000 mg
  Filled 2021-09-06: qty 200

## 2021-09-06 MED ORDER — PANCRELIPASE (LIP-PROT-AMYL) 12000-38000 UNITS PO CPEP
72000.0000 [IU] | ORAL_CAPSULE | Freq: Three times a day (TID) | ORAL | Status: DC
  Administered 2021-09-06 – 2021-09-08 (×5): 72000 [IU] via ORAL
  Filled 2021-09-06 (×4): qty 2
  Filled 2021-09-06 (×2): qty 6
  Filled 2021-09-06: qty 2

## 2021-09-06 MED ORDER — LEVALBUTEROL HCL 0.63 MG/3ML IN NEBU: 0.6300 mg | INHALATION_SOLUTION | Freq: Four times a day (QID) | RESPIRATORY_TRACT | Status: DC | PRN

## 2021-09-06 MED ORDER — COLESTIPOL HCL 1 G PO TABS
1.0000 g | ORAL_TABLET | Freq: Two times a day (BID) | ORAL | Status: DC
  Administered 2021-09-06 – 2021-09-08 (×4): 1 g via ORAL
  Filled 2021-09-06 (×6): qty 1

## 2021-09-06 MED ORDER — MIDODRINE HCL 5 MG PO TABS
10.0000 mg | ORAL_TABLET | Freq: Every day | ORAL | Status: DC
  Administered 2021-09-06 – 2021-09-08 (×3): 10 mg via ORAL
  Filled 2021-09-06 (×3): qty 2

## 2021-09-06 MED ORDER — APIXABAN 2.5 MG PO TABS
2.5000 mg | ORAL_TABLET | Freq: Two times a day (BID) | ORAL | Status: DC
  Administered 2021-09-06 – 2021-09-08 (×5): 2.5 mg via ORAL
  Filled 2021-09-06 (×5): qty 1

## 2021-09-06 MED ORDER — OXYCODONE HCL 5 MG PO TABS
2.5000 mg | ORAL_TABLET | Freq: Four times a day (QID) | ORAL | Status: DC | PRN
  Filled 2021-09-06: qty 1

## 2021-09-06 NOTE — ED Provider Notes (Signed)
I assumed care of this patient.  Please see previous provider note for further details of Hx, PE.  Briefly patient is a 86 y.o. female who presented generalized fatigue, shortness of breath and dizziness.  Initial work-up notable for elevated lactic acid with leukocytosis patient was treated empirically with antibiotics. No current source.  She is awaiting a CT scan of the abdomen given her recent renal ablation for carcinoma  CT of the abdomen without source of infection.  Expected postprocedural changes noted.  Plan is to admit patient for further work-up and management.      Fatima Blank, MD 09/06/21 [phone]

## 2021-09-06 NOTE — Patient Outreach (Signed)
[facility]) Care Management  09/06/2021  Kim Burns 1936/01/19 [id number]   Telephone call to patient for hospital follow up. No answer.  Unable to leave a message.     Plan: RN CM will attempt patient again within 4 business days.  Jone Baseman, RN, MSN [facility] Care Management Care Management Coordinator Direct Line [phone] Toll Free: [phone]  Fax: [phone]

## 2021-09-06 NOTE — Progress Notes (Signed)
[facility] Progress Note   Subjective:    Seen and examined patient at bedside in ED. Patient recently hospitalized 08/25/21-08/31/21 for Afib/AVR (transitioned from IV Heparin to Eliquis), s/p L heart cath which showed non-obstructive CAD and normal LV function, and s/p Fistulogram by Dr. Carlis Abbott for bleeding AVF. Patient presents back to the ED c/o generalized weakness, SOB, and dizziness. She reports waking up in the morning prior to her scheduled hemodialysis feeling very weak like she's about to pass out. She managed to go to her scheduled hemodialysis and completed full treatment yesterday. She reports ongoing weakness after treatment which prompted her to come to the ED for further evaluation. She reports her SBPs have been ranging 107-109 at home. Midodrine was raised to '10mg'$  at recent discharge for hypotension. On arrival to ED, patient found to be in Afib. Noted HR ranging 90s-110s at bedside currently. She denies SOB, CP, ABD pain, and N/V. CXR unremarkable. Notable labs include: WBC 18.1, lactic acid 2.5, Troponin 32, SrCr 2.67, BUN 16, K+ 3.4, and Ca 8.3. Noted blood and urine cx obtained and she received dose ABX for possible septic picture. She remains afebrile. Plan for HD tomorrow per her routine schedule.  Objective Vitals:   09/06/21 0530 09/06/21 0600 09/06/21 0700 09/06/21 1102  BP: 131/85 (!) 145/80 120/86   Pulse: (!) 111 (!) 114 (!) 104   Resp: (!) '22 14 19   '$ Temp:  97.7 F (36.5 C)  98.5 F (36.9 C)  TempSrc:    Oral  SpO2: 92% 98% 96%    Physical Exam General: Elderly woman; awake, alert, on RA, NAD Heart: Tachy and Irregular; No murmurs, gallops, or rubs Lungs: Clear bilaterally; No wheezing, rales, or rhonchi Abdomen: Soft and non-tender Extremities: No edema BLLE Dialysis Access: L AVF (+) B/T   There were no vitals filed for this visit.  Intake/Output Summary (Last 24 hours) at 09/06/2021 1153 Last data filed at 09/06/2021 0502 Gross per 24 hour   Intake 1129.17 ml  Output --  Net 1129.17 ml    Additional Objective Labs: Basic Metabolic Panel: Recent Labs  Lab 08/31/21 0518 09/05/21 1837  NA 134* 137  K 3.5 3.4*  CL 96* 96*  CO2 26 29  GLUCOSE 143* 270*  BUN 17 16  CREATININE 2.92* 2.67*  CALCIUM 8.1* 8.3*   Liver Function Tests: Recent Labs  Lab 09/05/21 1837  AST 18  ALT 19  ALKPHOS 83  BILITOT 0.3  PROT 5.8*  ALBUMIN 2.3*   No results for input(s): "LIPASE", "AMYLASE" in the last 168 hours. CBC: Recent Labs  Lab 08/31/21 0518 09/05/21 1837  WBC 10.5 18.1*  HGB 8.0* 8.8*  HCT 24.1* 29.0*  MCV 87.3 92.9  PLT 351 586*   Blood Culture    Component Value Date/Time   SDES BLOOD RIGHT ANTECUBITAL 09/05/2021 2115   SPECREQUEST  09/05/2021 2115    BOTTLES DRAWN AEROBIC AND ANAEROBIC Blood Culture adequate volume   CULT  09/05/2021 2115    NO GROWTH < 12 HOURS Performed at [facility], [place] [address], [place], [place] [address]    REPTSTATUS PENDING 09/05/2021 2115    Cardiac Enzymes: No results for input(s): "CKTOTAL", "CKMB", "CKMBINDEX", "TROPONINI" in the last 168 hours. CBG: Recent Labs  Lab 08/30/21 1558 08/30/21 2128 08/31/21 0612 08/31/21 0643 09/06/21 1129  GLUCAP 248* 166* 153* 166* 153*   Iron Studies: No results for input(s): "IRON", "TIBC", "TRANSFERRIN", "FERRITIN" in the last 72 hours. Lab Results  Component Value Date   INR 1.3 (H) 09/05/2021   INR 0.9 08/22/2021   INR 1.11 11/12/2016   Studies/Results: CT ABDOMEN PELVIS WO CONTRAST  Result Date: 09/06/2021 CLINICAL DATA:  Kidney cancer, status post ablation. Shortness of breath and dizziness. EXAM: CT ABDOMEN AND PELVIS WITHOUT CONTRAST TECHNIQUE: Multidetector CT imaging of the abdomen and pelvis was performed following the standard protocol without IV contrast. RADIATION DOSE REDUCTION: This exam was performed according to the departmental dose-optimization program which includes automated exposure control,  adjustment of the mA and/or kV according to patient size and/or use of iterative reconstruction technique. COMPARISON:  08/25/2021. FINDINGS: Lower chest: The heart is normal in size and there is a trace pericardial effusion. Coronary artery calcifications are noted. There is a small pleural effusion on the left. Atelectasis is noted at the lung bases bilaterally. Hepatobiliary: No focal liver abnormality is seen. Status post cholecystectomy. No biliary dilatation. Pancreas: The pancreas is not well delineated due to lack of IV contrast. The patient is status post Whipple procedure by history with pancreatic atrophy. Spleen: Normal in size without focal abnormality. Adrenals/Urinary Tract: No adrenal nodule or mass. Multiple cysts and hyperdense lesions are present in the kidneys bilaterally and not well evaluated due to lack of IV contrast. The hyperdense lesions in the left kidney are new from the previous exam. A previously described enhancing solid lesion in the medial aspect of the lower pole of the left kidney has undergone recent ablation and coil embolization by history. Increased perinephric fat stranding is noted on the left, possible edema versus blood products. Evaluation for residual neoplasm is limited due to lack of IV contrast. A few renal calculi are noted bilaterally. No hydronephrosis. The bladder is decompressed. Stomach/Bowel: Postsurgical changes are noted in the upper abdomen, consistent with Whipple procedure. Appendix is not seen. No evidence of bowel wall thickening, distention, or inflammatory changes. No free air or pneumatosis. Diverticula are noted along the sigmoid colon without evidence of diverticulitis. Vascular/Lymphatic: Aortic atherosclerosis. No enlarged abdominal or pelvic lymph nodes. Reproductive: Status post hysterectomy. No adnexal masses. Other: Multifocal fat containing hernias in the anterior abdominal wall in the midline superior to the umbilicus. No abdominopelvic  ascites. Musculoskeletal: Degenerative changes are present in the thoracolumbar spine. No acute or suspicious osseous abnormality. IMPRESSION: 1. Status post renal mass ablation with embolization with stable mass in the medial aspect of the left kidney. Evaluation is limited due to lack of IV contrast. Increased perinephric fat stranding on the left may represent edema or blood products. New hyperdense lesions are noted in the left kidney suggesting hemorrhagic or proteinaceous cysts. 2. Small left pleural effusion with atelectasis at the lung bases. 3. Aortic atherosclerosis. 4. Remaining incidental findings as described above. Electronically Signed   By: Brett Fairy M.D.   On: 09/06/2021 03:52   DG Chest 2 View  Result Date: 09/05/2021 CLINICAL DATA:  Shortness of breath EXAM: CHEST - 2 VIEW COMPARISON:  11/12/2016 FINDINGS: The heart size and mediastinal contours are within normal limits. Both lungs are clear. The visualized skeletal structures are unremarkable. IMPRESSION: No active cardiopulmonary disease. Electronically Signed   By: Ulyses Jarred M.D.   On: 09/05/2021 19:39    Medications:  sodium chloride      apixaban  2.5 mg Oral BID   colestipol  1 g Oral BID   diclofenac Sodium  4 g Topical QID   escitalopram  20 mg Oral QHS   folic acid  1 mg Oral Daily  insulin aspart  0-6 Units Subcutaneous TID WC   insulin aspart  10 Units Subcutaneous TID WC   lipase/protease/amylase  36,000 Units Oral TID WC   metoprolol succinate  12.5 mg Oral Daily   midodrine  10 mg Oral Daily   multivitamin with minerals  1 tablet Oral QHS   sevelamer carbonate  800 mg Oral TID WC   sodium chloride flush  3 mL Intravenous Q12H   sodium chloride flush  3 mL Intravenous Q12H   thiamine  100 mg Oral Daily    Dialysis Orders: MWF- [facility] 3hrs2mn, BFR 350, DFR Auto 1.5, EDW 57.5kg-recently lowered on 09/05/21, 2K/ 2Ca Access: L AVF  Heparin 2000 units bolus with HD Calcitriol 152m  PO qHD- last 09/05/21 Venofer '100mg'$  IV X 7 doses-already received 3 doses, last dose 09/05/21  Assessment/Plan: 1. Symptomatic Afib/RVR-Recently discharged on Toprol-XL. Appears IV Cardizem will be initiated here, agree checking orthostatic VS 2. Elevated WBC/Lactic Acid-dose ABX given in ED for possible septic picture but now off. She remains afebrile. More likely due to ischemic demand from Afib with RVR. Awaiting blood and urine cxs.  2. ESRD -on HD MWF. No indication for emergent dialysis today. Plan for HD 7/21 per her usual schedule. 3. Anemia of CKD- Hgb now 8.8. Holding ESA 2nd RCC. checking iron studies in AM. 4. Secondary hyperparathyroidism - Will continue Calcitriol. Checking PO4 in AM 5. HTN/volume - Noted SBPs in 120s today, continue Midodrine for now. Euvolemic on exam. CXR unremarkable. 6. Nutrition - Renal diet with fluid restriction  CoTobie PoetNP [facility] 09/06/2021,11:53 AM  LOS: 0 days

## 2021-09-06 NOTE — H&P (Signed)
History and Physical    Patient: Kim Burns PJK:[id number] DOB: [birth year] DOA: 09/05/2021 DOS: the patient was seen and examined on 09/06/2021 PCP: [name], MD  Patient coming from: Home  Chief Complaint:  Chief Complaint  Patient presents with   Weakness   HPI: Kim Burns is a 86 y.o. female with medical history significant of diabetes type 2, left renal cell carcinoma with recent ablation 2 weeks prior, CAD, new onset atrial fibrillation diagnosed 2 weeks ago, CVA and chronic kidney disease stage V on hemodialysis.  As noted patient was recently diagnosed with A-fib RVR that developed during the ablation procedure 2 weeks ago.  She was started on rate control agents as well as Eliquis for stroke prophylaxis.  She spontaneously converted back to sinus rhythm prior to discharge.  She had previously been on midodrine on dialysis days and due to issues with recurrent hypotension on off dialysis days her midodrine was increased to 10 mg daily.  The patient reports that beginning this past Tuesday she just felt bad.  When she got up to go to dialysis on Wednesday she felt weak/like she was going to faint and was having difficulty getting out of bed.  She ate a light breakfast and went to dialysis as usual.  During dialysis she was apparently hemodynamically stable although because she was having associated shortness of breath she was placed on supplemental oxygen during her dialysis procedure.  After dialysis she attempted to stand to go home but she was unable to stand and she was taken to the car by wheelchair.  She immediately went home and laid down to rest.  She slept about 2 hours and got up to go eat.  She continued to feel weak and family assisted with taking her vital signs noting her blood pressure was in the 100s and her heart rate was between 170 and 180.  She decided at this point she should present to the ER for evaluation.  She was evaluated in ER triage by a  physician assistant.  Her blood pressure was noted to be 91/57 with a pulse of 113.  She was afebrile and O2 sats were within normal screening at 98%.  The treatment area and EKG did reveal persistent atrial fibrillation with rates in the 105-110 range.  Orthostatic vital signs were not obtained in the ER prior to administration of IV fluids.  Her initial labs demonstrated normal sodium, mild hypokalemia 3.4, low chloride at 96, glucose 270, creatinine 2.67, albumin 2.3, normal LFTs, elevated BNP at 2213, stable at bedtime troponin with flat trend range 32, mildly elevated serum lactate at 2.2 and 2.5, leukocytosis 18,100, hemoglobin 8.8, platelets 596,000, mildly elevated coags, urine was cloudy with moderate hemoglobin and a trace of leukocytes nitrite, protein 100, rare bacteria and 11-20 squamous epithelials.  Because of her presentation there was some concern for infectious etiology so blood culture and urine culture obtained.  CT abdomen and pelvis without any potential source of infection and otherwise unchanged from previous CTs demonstrating changes consistent with prior renal ablation procedure.  EKG with atrial fibrillation with ventricular rate 106 bpm, prolonged QTc of 523 ms but no other acute changes.  Portable chest x-ray without any acute changes.  Patient will be admitted under inpatient status for further evaluation and treatment of atrial fibrillation with RVR which is symptomatic as well has slightly worsening anemia in the context of recent initiation of Eliquis.   Review of Systems: As mentioned in the history  of present illness. All other systems reviewed and are negative. Past Medical History:  Diagnosis Date   Anemia of chronic disease    takes iron   Anxiety    Blood transfusion without reported diagnosis    Bradycardia    Diabetes mellitus without complication ([place])    became diabetic after Whipple procedure   Diarrhea    Dysrhythmia 04/16/2016   bradycardia due to  medication    ESRD on hemodialysis [facility])    M-W-F   GERD (gastroesophageal reflux disease)    Gout    Headache    History of kidney stones 06/2013   Hyperparathyroidism ([place])    Hypertension    PONV (postoperative nausea and vomiting)    Sleep apnea    no cpap machine. could not tolerate   Stroke ([place]) 04/16/2016   TIA 1995   Vitamin D deficiency    Past Surgical History:  Procedure Laterality Date   A/V FISTULAGRAM Left 08/30/2021   Procedure: A/V Fistulagram;  Surgeon: Marty Heck, MD;  Location: [place] [facility];  Service: Cardiovascular;  Laterality: Left;   ABDOMINAL HYSTERECTOMY  1985   complete   BACK SURGERY  6384   lower   BASCILIC VEIN TRANSPOSITION Left 04/24/2017   Procedure: LEFT ARM FIRST STAGE BASILIC VEIN TRANSPOSITION;  Surgeon: Serafina Mitchell, MD;  Location: [place];  Service: Vascular;  Laterality: Left;   [place] Left 07/10/2017   Procedure: SECOND STAGE BASILIC VEIN TRANSPOSITION LEFT ARM;  Surgeon: Serafina Mitchell, MD;  Location[facility];  Service: Vascular;  Laterality: Left;   BREAST LUMPECTOMY Left x 2   many years apart, benign   CHOLECYSTECTOMY  1985   CYSTOSCOPY WITH URETEROSCOPY AND STENT PLACEMENT Left 06/18/2013   Procedure: CYSTOSCOPY WITH Lef URETEROSCOPY AND Left STENT PLACEMENT;  Surgeon: Dutch Gray, MD;  Location: [facility];  Service: Urology;  Laterality: Left;   ESOPHAGOGASTRODUODENOSCOPY (EGD) WITH PROPOFOL N/A 11/13/2016   Procedure: ESOPHAGOGASTRODUODENOSCOPY (EGD) WITH PROPOFOL;  Surgeon: Otis Brace, MD;  Location: [place];  Service: Gastroenterology;  Laterality: N/A;   EUS N/A 02/07/2016   Procedure: ESOPHAGEAL ENDOSCOPIC ULTRASOUND (EUS) RADIAL;  Surgeon: Arta Silence, MD;  [facility];  Service: Endoscopy;  Laterality: N/A;   EYE SURGERY Bilateral 2014   ioc for catracts    FOOT SURGERY Left 1990   something with toes unsure what    HERNIA REPAIR  2018   IR EMBO TUMOR ORGAN  ISCHEMIA INFARCT INC GUIDE ROADMAPPING  08/22/2021   IR RADIOLOGIST EVAL & MGMT  05/18/2021   IR RENAL SUPRASEL UNI S&I MOD SED  08/22/2021   IR US GUIDE VASC ACCESS RIGHT  08/22/2021   LEFT HEART CATH AND CORONARY ANGIOGRAPHY N/A 08/27/2021   Procedure: LEFT HEART CATH AND CORONARY ANGIOGRAPHY;  Surgeon: Burnell Blanks, MD;  Location: [place] [facility];  Service: Cardiovascular;  Laterality: N/A;   PARATHYROID EXPLORATION     PERIPHERAL VASCULAR BALLOON ANGIOPLASTY Left 08/30/2021   Procedure: PERIPHERAL VASCULAR BALLOON ANGIOPLASTY;  Surgeon: Marty Heck, MD;  Location: [place] [facility];  Service: Cardiovascular;  Laterality: Left;  arm fistula   RADIOLOGY WITH ANESTHESIA Left 08/22/2021   Procedure: MICROWAVE ABLATION;  Surgeon: Sandi Mariscal, MD;  Location: [facility];  Service: Anesthesiology;  Laterality: Left;   WHIPPLE PROCEDURE N/A 04/23/2016   Procedure: WHIPPLE PROCEDURE;  Surgeon: Stark Klein, MD;  Location: [place];  Service: General;  Laterality: N/A;   Social History:  reports that  she has never smoked. She has never used smokeless tobacco. She reports that she does not drink alcohol and does not use drugs.  Allergies  Allergen Reactions   Gemfibrozil Other (See Comments)    Myalgia    Lotemax [Loteprednol Etabonate] Other (See Comments)    burning   Statins Other (See Comments)    Muscle weakness   Doxycycline Other (See Comments)    Unknown    Other Diarrhea    Real Butter   Tomato Other (See Comments)    Causes burping    Codeine Other (See Comments)    headache   Hydrocodone-Acetaminophen Other (See Comments)    Headache. Tolerates acetaminophen.    Family History  Problem Relation Age of Onset   Heart disease Mother    Stroke Mother    Cancer Father        colon   Alzheimer's disease Sister    Alzheimer's disease Sister    Cancer Brother        brain   Breast cancer Neg Hx     Prior to Admission medications   Medication Sig Start Date End Date  Taking? Authorizing Provider  apixaban (ELIQUIS) 2.5 MG TABS tablet Take 1 tablet (2.5 mg total) by mouth 2 (two) times daily. 08/30/21 09/29/21 Yes Vashti Hey, MD  midodrine (PROAMATINE) 10 MG tablet Take 1 tablet (10 mg total) by mouth daily. Patient taking differently: Take 10 mg by mouth in the morning and at bedtime. 08/30/21 09/29/21 Yes Vashti Hey, MD  oxyCODONE-acetaminophen (PERCOCET) 5-325 MG tablet Take 1 tablet by mouth every 6 (six) hours as needed for severe pain. Patient taking differently: Take 1 tablet by mouth as needed for severe pain. 08/23/21  Yes Lura Em, PA  acetaminophen (TYLENOL 8 HOUR ARTHRITIS PAIN) 650 MG CR tablet Take 1,300 mg by mouth at bedtime.    [provider]  Biotin 10000 MCG TABS Take 20,000 mcg by mouth in the morning and at bedtime.    [provider]  colestipol (COLESTID) 1 g tablet Take 1 g by mouth 2 (two) times daily as needed (diarrhea).    [provider]  diclofenac sodium (VOLTAREN) 1 % GEL Apply 2 g topically 4 (four) times daily as needed (for hand pain).     [provider]  escitalopram (LEXAPRO) 20 MG tablet Take 20 mg by mouth at bedtime.    [provider]  insulin aspart (NOVOLOG FLEXPEN) 100 UNIT/ML FlexPen Inject 10 Units into the skin 3 (three) times daily with meals.    [provider]  lidocaine-prilocaine (EMLA) cream Apply 1 Application topically as needed (port access).    [provider]  Lifitegrast 5 % SOLN Place 1 drop into both eyes at bedtime.    [provider]  lipase/protease/amylase (CREON) 36000 UNITS CPEP capsule Take 1 capsule (36,000 Units total) by mouth with snacks. 08/30/21 11/28/21  Vashti Hey, MD  metoprolol succinate (TOPROL-XL) 25 MG 24 hr tablet Take 0.5 tablets (12.5 mg total) by mouth at bedtime. 08/30/21 09/29/21  Vashti Hey, MD  Multiple Vitamins-Minerals (PRESERVISION AREDS  2+MULTI VIT PO) Take 1 capsule by mouth in the morning and at bedtime.    [provider]  Propylene Glycol 0.6 % SOLN Place 1 drop into both eyes 4 (four) times daily as needed (for dry eyes).    [provider]  sevelamer carbonate (RENVELA) 800 MG tablet Take 1,600 mg by mouth 3 (three) times daily  with meals.    [provider]  VOLTAREN ARTHRITIS PAIN 1 % GEL Apply topically 4 (four) times daily. 09/04/21   [provider]    Physical Exam: Vitals:   09/06/21 0530 09/06/21 0600 09/06/21 0700 09/06/21 1102  BP: 131/85 (!) 145/80 120/86   Pulse: (!) 111 (!) 114 (!) 104   Resp: (!) '22 14 19   '$ Temp:  97.7 F (36.5 C)  98.5 F (36.9 C)  TempSrc:    Oral  SpO2: 92% 98% 96%    Constitutional: NAD, calm, comfortable; afebrile Eyes: PERRL, lids and conjunctivae normal ENMT: Mucous membranes are moist. Posterior pharynx clear of any exudate or lesions.Normal dentition.  Neck: normal, supple, no masses, no thyromegaly Respiratory: clear to auscultation bilaterally, no wheezing, no crackles. Normal respiratory effort. No accessory muscle use.  Oximetry 100% on room air Cardiovascular: Irregular rate and rhythm although remains mildly tachycardic at rest, no murmurs / rubs / gallops. No extremity edema. 2+ pedal pulses. No carotid bruits.  Abdomen: no tenderness, no masses palpated. No hepatosplenomegaly. Bowel sounds positive.  Musculoskeletal: no clubbing / cyanosis. No joint deformity upper and lower extremities. Good ROM, no contractures. Normal muscle tone.  Skin: no rashes, lesions, ulcers. No induration Neurologic: CN 2-12 grossly intact. Sensation intact, DTR normal. Strength 5/5 x all 4 extremities.  Psychiatric: Normal judgment and insight. Alert and oriented x 3. Normal mood.   Data Reviewed:  As per HPI  Assessment and Plan: Symptomatic atrial fibrillation with RVR While resting heart rates are stable; patient may also be developing  symptomatic anemia from subtle GI blood loss (see below) therefore resulting in RVR Continue Toprol-XL as prior to admission-indication at this time to initiate IV Cardizem Follow orthostatic vital signs-initial set ordered to be completed in the ER Underlying causes   Recent cardioembolic CVA Evaluated by neurology during recent admission in July. Imaging revealed scattered punctate infarcts likely cardioembolic in context of recent afibrillation  Slightly progressive anemia in the context of recent initiation of Eliquis Baseline hemoglobin 10-11 with current trend 8.8 Check fecal occult blood Patient reports that she always has somewhat darker stools in the context of her prior Whipple procedure but stools are much darker than usual No active bright red blood per rectum or hematemesis I have discussed with attending and plan is to continue preadmission dose of Eliquis for now obviously if she is heme positive or develops BRBPR this medication will likely need to be held with discussions held with patient/family as well as neurology regarding risk versus benefit of continuing this medication  Diabetes mellitus type 2, controlled Not on OHA or long-acting insulin prior to admission We will to eat without emesis so we will continue home dose of 10 units NovoLog meal coverage Follow CBGs AC/at bedtime and provide extra sensitive SSI given she is on HD  Renal cell carcinoma left kidney status post ablation 2 weeks prior CT imaging this admission shows expected postprocedural changes  CKD 5 on hemodialysis (MWF) at the [facility] Last hemodialysis on 7/19 and was able to complete a full treatment No clinical signs of volume overload at this juncture Consult nephrology-I spoke with Dr. Joylene Grapes and updated him on patient's status-he is clinically stable from a nephrology point of view and would not require dialysis until 7/21 Friday Admission patient had a malfunctioning AV fistula  and did undergo left AV fistula venogram as well as angioplasty in the mid upper arm by Dr. Carlis Abbott with vascular surgery  Abnormal labs Patient afebrile with elevated white count without any definitive source of infection.  Blood and urine cultures have been obtained.  Chest x-ray unremarkable.  Flat trend in serum lactate.  Suspect all of these abnormalities secondary to demand ischemia from hypoperfusion in the context of atrial fibrillation with RVR We will not continue antibiotics noting patient was given Zosyn and vancomycin in the ER for suspected sepsis physiology Recommend following up on cultures especially blood cultures since patient is dialysis.  If she develops fever or other signs of infection and or has a positive blood culture will need to resume antibiotics  History of prior Whipple procedure 2018 Continue Creon   Advance Care Planning:    Code Status: Full Code discussed with patient at bedside-she has a living will at home and her children are aware that after resuscitation if she remains on prolonged support she would not wish to live long-term like this and request that support be withdrawn  Consults: Nephrology  DVT prophylaxis: Eliquis  Family Communication: Patient only  Severity of Illness: The appropriate patient status for this patient is INPATIENT. Inpatient status is judged to be reasonable and necessary in order to provide the required intensity of service to ensure the patient's safety. The patient's presenting symptoms, physical exam findings, and initial radiographic and laboratory data in the context of their chronic comorbidities is felt to place them at high risk for further clinical deterioration. Furthermore, it is not anticipated that the patient will be medically stable for discharge from the hospital within 2 midnights of admission.   * I certify that at the point of admission it is my clinical judgment that the patient will require inpatient hospital  care spanning beyond 2 midnights from the point of admission due to high intensity of service, high risk for further deterioration and high frequency of surveillance required.*  Author: Erin Hearing, NP 09/06/2021 11:32 AM  For on call review [email or web address].

## 2021-09-07 ENCOUNTER — Other Ambulatory Visit: Payer: Self-pay

## 2021-09-07 ENCOUNTER — Encounter (HOSPITAL_COMMUNITY): Payer: Self-pay | Admitting: Family Medicine

## 2021-09-07 DIAGNOSIS — I4891 Unspecified atrial fibrillation: Secondary | ICD-10-CM | POA: Diagnosis not present

## 2021-09-07 DIAGNOSIS — I951 Orthostatic hypotension: Secondary | ICD-10-CM | POA: Diagnosis not present

## 2021-09-07 DIAGNOSIS — N186 End stage renal disease: Secondary | ICD-10-CM

## 2021-09-07 DIAGNOSIS — Z794 Long term (current) use of insulin: Secondary | ICD-10-CM | POA: Diagnosis not present

## 2021-09-07 DIAGNOSIS — I480 Paroxysmal atrial fibrillation: Secondary | ICD-10-CM

## 2021-09-07 DIAGNOSIS — E1122 Type 2 diabetes mellitus with diabetic chronic kidney disease: Secondary | ICD-10-CM | POA: Diagnosis not present

## 2021-09-07 DIAGNOSIS — Z992 Dependence on renal dialysis: Secondary | ICD-10-CM

## 2021-09-07 LAB — RENAL FUNCTION PANEL
Albumin: 2.1 g/dL — ABNORMAL LOW (ref 3.5–5.0)
Anion gap: 10 (ref 5–15)
BUN: 31 mg/dL — ABNORMAL HIGH (ref 8–23)
CO2: 27 mmol/L (ref 22–32)
Calcium: 8.7 mg/dL — ABNORMAL LOW (ref 8.9–10.3)
Chloride: 98 mmol/L (ref 98–111)
Creatinine, Ser: 4.39 mg/dL — ABNORMAL HIGH (ref 0.44–1.00)
GFR, Estimated: 9 mL/min — ABNORMAL LOW (ref >60)
Glucose, Bld: 138 mg/dL — ABNORMAL HIGH (ref 70–99)
Phosphorus: 3.7 mg/dL (ref 2.5–4.6)
Potassium: 3.7 mmol/L (ref 3.5–5.1)
Sodium: 135 mmol/L (ref 135–145)

## 2021-09-07 LAB — CBC
HCT: 26.2 % — ABNORMAL LOW (ref 36.0–46.0)
Hemoglobin: 8.1 g/dL — ABNORMAL LOW (ref 12.0–15.0)
MCH: 28.3 pg (ref 26.0–34.0)
MCHC: 30.9 g/dL (ref 30.0–36.0)
MCV: 91.6 fL (ref 80.0–100.0)
Platelets: 503 10*3/uL — ABNORMAL HIGH (ref 150–400)
RBC: 2.86 MIL/uL — ABNORMAL LOW (ref 3.87–5.11)
RDW: 16.3 % — ABNORMAL HIGH (ref 11.5–15.5)
WBC: 15.7 10*3/uL — ABNORMAL HIGH (ref 4.0–10.5)
nRBC: 0 % (ref 0.0–0.2)

## 2021-09-07 LAB — GLUCOSE, CAPILLARY
Glucose-Capillary: 129 mg/dL — ABNORMAL HIGH (ref 70–99)
Glucose-Capillary: 182 mg/dL — ABNORMAL HIGH (ref 70–99)

## 2021-09-07 LAB — CBG MONITORING, ED
Glucose-Capillary: 126 mg/dL — ABNORMAL HIGH (ref 70–99)
Glucose-Capillary: 131 mg/dL — ABNORMAL HIGH (ref 70–99)
Glucose-Capillary: 144 mg/dL — ABNORMAL HIGH (ref 70–99)

## 2021-09-07 LAB — URINE CULTURE

## 2021-09-07 MED ORDER — ALTEPLASE 2 MG IJ SOLR: 2.0000 mg | Freq: Once | INTRAMUSCULAR | Status: DC | PRN

## 2021-09-07 MED ORDER — LIDOCAINE HCL (PF) 1 % IJ SOLN: 5.0000 mL | INTRAMUSCULAR | Status: DC | PRN

## 2021-09-07 MED ORDER — PENTAFLUOROPROP-TETRAFLUOROETH EX AERO: 1.0000 | INHALATION_SPRAY | CUTANEOUS | Status: DC | PRN

## 2021-09-07 MED ORDER — HEPARIN SODIUM (PORCINE) 1000 UNIT/ML DIALYSIS: 1000.0000 [IU] | INTRAMUSCULAR | Status: DC | PRN

## 2021-09-07 MED ORDER — LACTATED RINGERS IV SOLN: INTRAVENOUS | Status: DC

## 2021-09-07 MED ORDER — MELATONIN 3 MG PO TABS
3.0000 mg | ORAL_TABLET | Freq: Every evening | ORAL | Status: DC | PRN
  Administered 2021-09-07: 3 mg via ORAL
  Filled 2021-09-07: qty 1

## 2021-09-07 MED ORDER — LIDOCAINE-PRILOCAINE 2.5-2.5 % EX CREA
1.0000 | TOPICAL_CREAM | CUTANEOUS | Status: DC | PRN
Start: 2021-09-07 — End: 2021-09-08

## 2021-09-07 MED ORDER — AMIODARONE HCL 200 MG PO TABS
200.0000 mg | ORAL_TABLET | Freq: Two times a day (BID) | ORAL | Status: DC
  Administered 2021-09-07 – 2021-09-08 (×2): 200 mg via ORAL
  Filled 2021-09-07 (×2): qty 1

## 2021-09-07 MED ORDER — CHLORHEXIDINE GLUCONATE CLOTH 2 % EX PADS
6.0000 | MEDICATED_PAD | Freq: Every day | CUTANEOUS | Status: DC
Start: 2021-09-07 — End: 2021-09-08

## 2021-09-07 NOTE — Progress Notes (Signed)
PROGRESS NOTE    Kim Burns  ION:[id number] DOB: June 04, 1935 DOA: 09/05/2021 PCP: [name], MD   Brief Narrative: No notes on file   Assessment and Plan:  Atrial fibrillation with RVR Recently diagnosed. Patient started on metoprolol and Eliquis. Spontaneously reverted back to sinus rhythm prior to recent discharge. Currently reverted back to atrial fibrillation. Rates continue to be elevated but patient is overall stable and currently asymptomatic. -Continue metoprolol -Cardiology follow-up  Orthostatic hypotension Chronic. Related to hemodialysis. Possibly exacerbated by atrial fibrillation with RVR. Blood pressure currently normotensive outside of HD. Patient is on metoprolol for rate management of atrial fibrillation -Continue midodrine  Anemia Normocytic. Appears to be chronic and currently stable. No evidence of bleeding. Patient is on Eliquis. No history of colonoscopy but patient has remained up to date with colon cancer screening with stool card testing.  ESRD on HD Patient received HD on a Monday, Wednesday, Friday schedule. Nephrology consulted for HD while inpatient.  Diabetes mellitus, type 2 Most recent hemoglobin A1C of 6.2%. -Continue SSI  Renal cell carcinoma History of ablation. Noted  Chronic CVA Patient is on Eliquis -Continue Eliquis  CAD Recent heart catheterization significant for nonobstructive disease.  Leukocytosis Patient does not meet sepsis criteria secondary to no source.Patient received empiric vancomycin and Zosyn in the ED which were not continued. Blood cultures obtained and are no growth to date. Urine culture with multiple species. Leukocytosis is improved slightly. -CBC in AM  History of whipple procedure -Continue Creon  DVT prophylaxis: Eliquis Code Status:   Code Status: Full Code Family Communication: None at bedside Disposition Plan: Discharge likely home in 1-2 days pending stable heart rates, PT/OT  recommendations and specialist recommendations   Consultants:  Nephrology Cardiology  Procedures:  Hemodialysis  Antimicrobials: Vancomycin Zosyn    Subjective: Patient reports having a poor night's slight last night. No other concerns at this time.  Objective: BP 123/75 (BP Location: Right Arm)   Pulse (!) 114   Temp 97.9 F (36.6 C) (Oral)   Resp 17   Ht '5\' 2"'$  (1.575 m)   Wt 59.3 kg   SpO2 100%   BMI 23.91 kg/m   Examination:  General exam: Appears calm and comfortable Respiratory system: Clear to auscultation. Respiratory effort normal. Cardiovascular system: S1 & S2 heard, irregular rhythm with slightly increased rate. Gastrointestinal system: Abdomen is nondistended, soft and nontender. Normal bowel sounds heard. Central nervous system: Alert and oriented. No focal neurological deficits. Musculoskeletal: No LE edema. No calf tenderness Skin: No cyanosis. No rashes Psychiatry: Judgement and insight appear normal. Mood & affect appropriate.    Data Reviewed: I have personally reviewed following labs and imaging studies  CBC Lab Results  Component Value Date   WBC 15.7 (H) 09/07/2021   RBC 2.86 (L) 09/07/2021   HGB 8.1 (L) 09/07/2021   HCT 26.2 (L) 09/07/2021   MCV 91.6 09/07/2021   MCH 28.3 09/07/2021   PLT 503 (H) 09/07/2021   MCHC 30.9 09/07/2021   RDW 16.3 (H) 09/07/2021   LYMPHSABS 1.0 08/30/2021   MONOABS 0.9 08/30/2021   EOSABS 0.1 08/30/2021   BASOSABS 0.0 24/40/1027     Last metabolic panel Lab Results  Component Value Date   NA 135 09/07/2021   K 3.7 09/07/2021   CL 98 09/07/2021   CO2 27 09/07/2021   BUN 31 (H) 09/07/2021   CREATININE 4.39 (H) 09/07/2021   GLUCOSE 138 (H) 09/07/2021   GFRNONAA 9 (L) 09/07/2021   GFRAA  13 (L) 11/12/2019   CALCIUM 8.7 (L) 09/07/2021   PHOS 3.7 09/07/2021   PROT 5.8 (L) 09/05/2021   ALBUMIN 2.1 (L) 09/07/2021   LABGLOB 2.6 10/04/2016   AGRATIO 1.2 10/04/2016   BILITOT 0.3 09/05/2021   ALKPHOS  83 09/05/2021   AST 18 09/05/2021   ALT 19 09/05/2021   ANIONGAP 10 09/07/2021    GFR: Estimated Creatinine Clearance: 7.5 mL/min (A) (by C-G formula based on SCr of 4.39 mg/dL (H)).  Recent Results (from the past 240 hour(s))  Blood Culture (routine x 2)     Status: None (Preliminary result)   Collection Time: 09/05/21  9:00 PM   Specimen: BLOOD RIGHT WRIST  Result Value Ref Range Status   Specimen Description BLOOD RIGHT WRIST  Final   Special Requests   Final    BOTTLES DRAWN AEROBIC AND ANAEROBIC Blood Culture results may not be optimal due to an inadequate volume of blood received in culture bottles   Culture   Final    NO GROWTH 2 DAYS Performed at [facility], [place] [address] [place], [place] [address]    Report Status PENDING  Incomplete  Urine Culture     Status: Abnormal   Collection Time: 09/05/21  9:00 PM   Specimen: In/Out Cath Urine  Result Value Ref Range Status   Specimen Description IN/OUT CATH URINE  Final   Special Requests   Final    NONE Performed at [facility], [place] [address], [place], [place] [address]    Culture MULTIPLE SPECIES PRESENT, SUGGEST RECOLLECTION (A)  Final   Report Status 09/07/2021 FINAL  Final  Blood Culture (routine x 2)     Status: None (Preliminary result)   Collection Time: 09/05/21  9:15 PM   Specimen: BLOOD  Result Value Ref Range Status   Specimen Description BLOOD RIGHT ANTECUBITAL  Final   Special Requests   Final    BOTTLES DRAWN AEROBIC AND ANAEROBIC Blood Culture adequate volume   Culture   Final    NO GROWTH 2 DAYS Performed at [facility], [place] [address], [place], [place] [address]    Report Status PENDING  Incomplete      Radiology Studies: CT ABDOMEN PELVIS WO CONTRAST  Result Date: 09/06/2021 CLINICAL DATA:  Kidney cancer, status post ablation. Shortness of breath and dizziness. EXAM: CT ABDOMEN AND PELVIS WITHOUT CONTRAST TECHNIQUE: Multidetector CT imaging of the abdomen and pelvis was  performed following the standard protocol without IV contrast. RADIATION DOSE REDUCTION: This exam was performed according to the departmental dose-optimization program which includes automated exposure control, adjustment of the mA and/or kV according to patient size and/or use of iterative reconstruction technique. COMPARISON:  08/25/2021. FINDINGS: Lower chest: The heart is normal in size and there is a trace pericardial effusion. Coronary artery calcifications are noted. There is a small pleural effusion on the left. Atelectasis is noted at the lung bases bilaterally. Hepatobiliary: No focal liver abnormality is seen. Status post cholecystectomy. No biliary dilatation. Pancreas: The pancreas is not well delineated due to lack of IV contrast. The patient is status post Whipple procedure by history with pancreatic atrophy. Spleen: Normal in size without focal abnormality. Adrenals/Urinary Tract: No adrenal nodule or mass. Multiple cysts and hyperdense lesions are present in the kidneys bilaterally and not well evaluated due to lack of IV contrast. The hyperdense lesions in the left kidney are new from the previous exam. A previously described enhancing solid lesion in the  medial aspect of the lower pole of the left kidney has undergone recent ablation and coil embolization by history. Increased perinephric fat stranding is noted on the left, possible edema versus blood products. Evaluation for residual neoplasm is limited due to lack of IV contrast. A few renal calculi are noted bilaterally. No hydronephrosis. The bladder is decompressed. Stomach/Bowel: Postsurgical changes are noted in the upper abdomen, consistent with Whipple procedure. Appendix is not seen. No evidence of bowel wall thickening, distention, or inflammatory changes. No free air or pneumatosis. Diverticula are noted along the sigmoid colon without evidence of diverticulitis. Vascular/Lymphatic: Aortic atherosclerosis. No enlarged abdominal or  pelvic lymph nodes. Reproductive: Status post hysterectomy. No adnexal masses. Other: Multifocal fat containing hernias in the anterior abdominal wall in the midline superior to the umbilicus. No abdominopelvic ascites. Musculoskeletal: Degenerative changes are present in the thoracolumbar spine. No acute or suspicious osseous abnormality. IMPRESSION: 1. Status post renal mass ablation with embolization with stable mass in the medial aspect of the left kidney. Evaluation is limited due to lack of IV contrast. Increased perinephric fat stranding on the left may represent edema or blood products. New hyperdense lesions are noted in the left kidney suggesting hemorrhagic or proteinaceous cysts. 2. Small left pleural effusion with atelectasis at the lung bases. 3. Aortic atherosclerosis. 4. Remaining incidental findings as described above. Electronically Signed   By: Brett Fairy M.D.   On: 09/06/2021 03:52   DG Chest 2 View  Result Date: 09/05/2021 CLINICAL DATA:  Shortness of breath EXAM: CHEST - 2 VIEW COMPARISON:  11/12/2016 FINDINGS: The heart size and mediastinal contours are within normal limits. Both lungs are clear. The visualized skeletal structures are unremarkable. IMPRESSION: No active cardiopulmonary disease. Electronically Signed   By: Ulyses Jarred M.D.   On: 09/05/2021 19:39      LOS: 1 day    Cordelia Poche, MD [facility] 09/07/2021, 3:34 PM   If 7PM-7AM, please contact night-coverage [email or web address]

## 2021-09-07 NOTE — Progress Notes (Signed)
[facility] Progress Note   Subjective:    Patient had a rough night.  Boarding in the ER.  Very difficult to sleep.  Had some borderline high temperatures near 100 Fahrenheit.  Otherwise feeling okay.  Continues to have atrial fibrillation with RVR  Objective Vitals:   09/07/21 0600 09/07/21 0730 09/07/21 0755 09/07/21 0759  BP: 136/74 135/78  135/78  Pulse: (!) 118 (!) 103  (!) 129  Resp: 18 18  (!) 21  Temp:  99.8 F (37.7 C)  98.9 F (37.2 C)  TempSrc:  Oral  Oral  SpO2: 95% 90%  98%  Weight:   59.4 kg   Height:   '5\' 2"'$  (1.575 m)    Physical Exam General: Elderly woman; awake, alert, on RA, NAD Heart: Tachy and Irregular; no audible murmur Lungs: Bilateral chest rise with no increased work of breathing Abdomen: Soft and non-tender Extremities: No edema BLLE Dialysis Access: L AVF (+) B/T   Filed Weights   09/07/21 0755  Weight: 59.4 kg    Intake/Output Summary (Last 24 hours) at 09/07/2021 0948 Last data filed at 09/06/2021 1233 Gross per 24 hour  Intake 120 ml  Output --  Net 120 ml    Additional Objective Labs: Basic Metabolic Panel: Recent Labs  Lab 09/05/21 1837 09/07/21 0521  NA 137 135  K 3.4* 3.7  CL 96* 98  CO2 29 27  GLUCOSE 270* 138*  BUN 16 31*  CREATININE 2.67* 4.39*  CALCIUM 8.3* 8.7*  PHOS  --  3.7   Liver Function Tests: Recent Labs  Lab 09/05/21 1837 09/07/21 0521  AST 18  --   ALT 19  --   ALKPHOS 83  --   BILITOT 0.3  --   PROT 5.8*  --   ALBUMIN 2.3* 2.1*   No results for input(s): "LIPASE", "AMYLASE" in the last 168 hours. CBC: Recent Labs  Lab 09/05/21 1837 09/07/21 0521  WBC 18.1* 15.7*  HGB 8.8* 8.1*  HCT 29.0* 26.2*  MCV 92.9 91.6  PLT 586* 503*   Blood Culture    Component Value Date/Time   SDES BLOOD RIGHT ANTECUBITAL 09/05/2021 2115   SPECREQUEST  09/05/2021 2115    BOTTLES DRAWN AEROBIC AND ANAEROBIC Blood Culture adequate volume   CULT  09/05/2021 2115    NO GROWTH 2  DAYS Performed at [facility], [place] [address], [place], [place] [address]    REPTSTATUS PENDING 09/05/2021 2115    Cardiac Enzymes: No results for input(s): "CKTOTAL", "CKMB", "CKMBINDEX", "TROPONINI" in the last 168 hours. CBG: Recent Labs  Lab 09/06/21 1129 09/06/21 1653 09/07/21 0038 09/07/21 0538 09/07/21 0731  GLUCAP 153* 83 126* 144* 131*   Iron Studies:  Recent Labs    09/06/21 1132  IRON 45  TIBC 197*  FERRITIN 713*   Lab Results  Component Value Date   INR 1.3 (H) 09/05/2021   INR 0.9 08/22/2021   INR 1.11 11/12/2016   Studies/Results: CT ABDOMEN PELVIS WO CONTRAST  Result Date: 09/06/2021 CLINICAL DATA:  Kidney cancer, status post ablation. Shortness of breath and dizziness. EXAM: CT ABDOMEN AND PELVIS WITHOUT CONTRAST TECHNIQUE: Multidetector CT imaging of the abdomen and pelvis was performed following the standard protocol without IV contrast. RADIATION DOSE REDUCTION: This exam was performed according to the departmental dose-optimization program which includes automated exposure control, adjustment of the mA and/or kV according to patient size and/or use of iterative reconstruction technique. COMPARISON:  08/25/2021. FINDINGS: Lower chest: The heart  is normal in size and there is a trace pericardial effusion. Coronary artery calcifications are noted. There is a small pleural effusion on the left. Atelectasis is noted at the lung bases bilaterally. Hepatobiliary: No focal liver abnormality is seen. Status post cholecystectomy. No biliary dilatation. Pancreas: The pancreas is not well delineated due to lack of IV contrast. The patient is status post Whipple procedure by history with pancreatic atrophy. Spleen: Normal in size without focal abnormality. Adrenals/Urinary Tract: No adrenal nodule or mass. Multiple cysts and hyperdense lesions are present in the kidneys bilaterally and not well evaluated due to lack of IV contrast. The hyperdense lesions in the left  kidney are new from the previous exam. A previously described enhancing solid lesion in the medial aspect of the lower pole of the left kidney has undergone recent ablation and coil embolization by history. Increased perinephric fat stranding is noted on the left, possible edema versus blood products. Evaluation for residual neoplasm is limited due to lack of IV contrast. A few renal calculi are noted bilaterally. No hydronephrosis. The bladder is decompressed. Stomach/Bowel: Postsurgical changes are noted in the upper abdomen, consistent with Whipple procedure. Appendix is not seen. No evidence of bowel wall thickening, distention, or inflammatory changes. No free air or pneumatosis. Diverticula are noted along the sigmoid colon without evidence of diverticulitis. Vascular/Lymphatic: Aortic atherosclerosis. No enlarged abdominal or pelvic lymph nodes. Reproductive: Status post hysterectomy. No adnexal masses. Other: Multifocal fat containing hernias in the anterior abdominal wall in the midline superior to the umbilicus. No abdominopelvic ascites. Musculoskeletal: Degenerative changes are present in the thoracolumbar spine. No acute or suspicious osseous abnormality. IMPRESSION: 1. Status post renal mass ablation with embolization with stable mass in the medial aspect of the left kidney. Evaluation is limited due to lack of IV contrast. Increased perinephric fat stranding on the left may represent edema or blood products. New hyperdense lesions are noted in the left kidney suggesting hemorrhagic or proteinaceous cysts. 2. Small left pleural effusion with atelectasis at the lung bases. 3. Aortic atherosclerosis. 4. Remaining incidental findings as described above. Electronically Signed   By: Brett Fairy M.D.   On: 09/06/2021 03:52   DG Chest 2 View  Result Date: 09/05/2021 CLINICAL DATA:  Shortness of breath EXAM: CHEST - 2 VIEW COMPARISON:  11/12/2016 FINDINGS: The heart size and mediastinal contours are  within normal limits. Both lungs are clear. The visualized skeletal structures are unremarkable. IMPRESSION: No active cardiopulmonary disease. Electronically Signed   By: Ulyses Jarred M.D.   On: 09/05/2021 19:39    Medications:  sodium chloride      apixaban  2.5 mg Oral BID   calcitRIOL  1 mcg Oral Q M,W,F-HD   Chlorhexidine Gluconate Cloth  6 each Topical Q0600   colestipol  1 g Oral BID   diclofenac Sodium  4 g Topical QID   escitalopram  20 mg Oral QHS   folic acid  1 mg Oral Daily   insulin aspart  0-6 Units Subcutaneous TID WC   insulin aspart  10 Units Subcutaneous TID WC   lipase/protease/amylase  72,000 Units Oral TID with meals   metoprolol succinate  12.5 mg Oral Daily   midodrine  10 mg Oral Daily   multivitamin with minerals  1 tablet Oral QHS   sevelamer carbonate  800 mg Oral TID WC   sodium chloride flush  3 mL Intravenous Q12H   sodium chloride flush  3 mL Intravenous Q12H   thiamine  100  mg Oral Daily    Dialysis Orders: MWF- [facility] [id number], BFR 350, DFR Auto 1.5, EDW 57.5kg-recently lowered on 09/05/21, 2K/ 2Ca Access: L AVF  Heparin 2000 units bolus with HD Calcitriol 192m PO qHD- last 09/05/21 Venofer '100mg'$  IV X 7 doses-already received 3 doses, last dose 09/05/21  Assessment/Plan: 1. Symptomatic Afib/RVR-Recently discharged on Toprol-XL.  Management titration per primary team and cardiology if needed 2.  Leukocytosis: Borderline fevers.  Work-up and management per primary team 2. ESRD -on HD MWF.  Plan for dialysis today per schedule 3. Anemia of CKD- Hgb now 8.1. Holding ESA 2nd RCC. Iron sat 23 and ferritin 713. Holding iron given potential infection. CTM for now. 4. Secondary hyperparathyroidism - Will continue Calcitriol. Phos normal on home sevelamer 5. HTN/volume - Noted SBPs in 120s today, continue Midodrine for now. Euvolemic on exam. CXR unremarkable. 6. Nutrition - Renal diet with fluid restriction  [place]/21/2023,9:48 AM  LOS: 1 day

## 2021-09-07 NOTE — Progress Notes (Signed)
Received patient in bed, alert and oriented. Informed consent signed and in chart.  Time tx completed: 2780  HD treatment completed. Patient tolerated well. LUA Fistula without signs and symptoms of complications. Patient transported back to the room, alert and orient and in no acute distress. Report given to bedside RN.  Total UF removed: 1.5L  Medication given: n/a   Post HD VS:  BP: 131/72 Resp- 15 Pulse- 74 O2- 98% Temp 97.8  Post HD weight: 57.5kg

## 2021-09-07 NOTE — ED Notes (Signed)
Pt transported on tele to dialysis. Report given and care endorsed

## 2021-09-07 NOTE — Progress Notes (Signed)
PT Cancellation Note  Patient Details Name: Kim Burns MRN: [id number] DOB: May 26, 1935   Cancelled Treatment:    Reason Eval/Treat Not Completed: Patient at procedure or test/unavailable. Patient off floor for HD at this time. Will evaluate when available.   [name] 09/07/2021, 1:31 PM

## 2021-09-07 NOTE — Consult Note (Addendum)
Cardiology Consultation:   Patient ID: Kim Burns MRN: [id number]; DOB: September 27, 1935  Admit date: 09/05/2021 Date of Consult: 09/07/2021  PCP:  [name], MD   [facility] Providers Cardiologist:  Peter Martinique, MD        Patient Profile:   Kim Burns is a 86 y.o. female with a hx of hyperlipidemia, DM2, recently diagnosed paroxysmal A-fib on 2.5 mg twice daily Eliquis, CAD, history of CVA, left renal cell carcinoma, history of Whipple surgery, and ESRD started dialysis January 2023 who is being seen 09/07/2021 for the evaluation of atrial fibrillation with RVR at the request of Dr. Lonny Prude.  History of Present Illness:   Kim Burns is a very pleasant 86 year old female with past medical history of hyperlipidemia, DM2, recently diagnosed paroxysmal A-fib on 2.5 mg twice daily Eliquis, CAD, history of CVA, left renal cell carcinoma, history of Whipple surgery, and ESRD started dialysis January 2023.  Patient became diabetic after Whipple procedure in 2014 with pancreatectomy and partial bowel resection.  Echocardiogram in 2015 showed normal EF with LVH.  Her end-stage renal disease was felt to be related to hypertension and diabetes.  Since she started on dialysis in January of this year, she has been developing orthostatic hypotension that was worse on dialysis days, therefore antihypertensive has been discontinued and she is now on midodrine 10 mg twice a day with recommendation of possible extra mid-day dose if blood pressure remain low.  She has recently been reestablished with Dr. Martinique in June 2023. Given constellation of issues including orthostatic hypotension, bilateral carpal tunnel syndrome, it was recommended to consider work-up for systemic amyloid if echocardiogram was abnormal. Echocardiogram obtained on 08/08/2021 showed EF 60 to 65%, mild LVH, no regional wall motion abnormality, grade 1 DD, mild MR. She underwent microwave ablation of left renal mass on 08/22/2021.   She was discharged on the following day, however returned 2 days later on 08/25/2021 with significant chest pain, new atrial fibrillation with RVR and elevated troponin.  CTA of chest abdomen and pelvis obtained on 7/8 showed no evidence of PE, 3 mm left pulmonary nodule, stable solid mass in the medial aspect of the left kidney.  Heart rate on arrival was 180 bpm.  She was placed on IV Cardizem and spontaneously self converted back to sinus rhythm.  Diagnostic cardiac catheterization performed on 08/27/2021 showed nonobstructive disease with 20% proximal RCA, 20% mid left main, 30% ostial LAD, 20% left circumflex artery, 50% proximal LAD, EF 55 to 65%.  Repeat echocardiogram obtained on the same day showed EF 60 to 65%, moderately reduced RV systolic function, RVSP 46.5 mmHg, grade 3 diastolic dysfunction, severe biatrial enlargement, mild MR, mild to moderate TR.  Patient was placed on 12.5 mg daily of Toprol-XL and the 2.5 mg twice a day of Eliquis.  Prior to the discharge, she did undergo left basilic vein angioplasty in the mid upper arm due to malfunctioning AV fistula, left arm basilic vein fistula had 80% stenosis, this was treated with balloon angioplasty.  She was discharged from the hospital last Friday.  She initially felt well and and was able to complete her dialysis on Monday 7/17.  By Tuesday, she was nauseated and did not feel very well.  On Wednesday, she went for her usual dialysis.  She was able to complete the entire session.  Afterward, she felt very weak and was noticing her blood pressure was lower than 100 mmHg.  She was assisted back to her car and  went home.  She slept for 2.5 hours at home, family noted her heart rate remained at 118 bpm.  This prompted her family member to bring her to [facility] for further evaluation.  On arrival, she was noted to be back in atrial fibrillation with RVR.  She was also hypotensive with blood pressure 191/57.  CT of abdomen and pelvis did not  show any source of infection even though her white blood cell count was elevated at 18,000.  EKG showed atrial fibrillation with RVR, heart rate 106.  Chest x-ray showed no acute changes.  Urinalysis showed rare bacteria.  She described low-grade fever.  Hemoglobin is 8.8.  She has been treated with antibiotic.  BNP was elevated at 2213. She was continued on her home dose of Eliquis and metoprolol.  Cardiology service consulted for atrial fibrillation with RVR.    Past Medical History:  Diagnosis Date   Anemia of chronic disease    takes iron   Anxiety    Blood transfusion without reported diagnosis    Bradycardia    Diabetes mellitus without complication ([place])    became diabetic after Whipple procedure   Diarrhea    Dysrhythmia 04/16/2016   bradycardia due to medication    ESRD on hemodialysis [facility])    M-W-F   GERD (gastroesophageal reflux disease)    Gout    Headache    History of kidney stones 06/2013   Hyperparathyroidism ([place])    Hypertension    PONV (postoperative nausea and vomiting)    Sleep apnea    no cpap machine. could not tolerate   Stroke ([place]) 04/16/2016   TIA 1995   Vitamin D deficiency     Past Surgical History:  Procedure Laterality Date   A/V FISTULAGRAM Left 08/30/2021   Procedure: A/V Fistulagram;  Surgeon: Marty Heck, MD;  Location: [place] [facility];  Service: Cardiovascular;  Laterality: Left;   ABDOMINAL HYSTERECTOMY  1985   complete   BACK SURGERY  3845   lower   BASCILIC VEIN TRANSPOSITION Left 04/24/2017   Procedure: LEFT ARM FIRST STAGE BASILIC VEIN TRANSPOSITION;  Surgeon: Serafina Mitchell, MD;  Location: [place];  Service: Vascular;  Laterality: Left;   [place] Left 07/10/2017   Procedure: SECOND STAGE BASILIC VEIN TRANSPOSITION LEFT ARM;  Surgeon: Serafina Mitchell, MD;  Location: [facility];  Service: Vascular;  Laterality: Left;   BREAST LUMPECTOMY Left x 2   many years apart, benign   CHOLECYSTECTOMY  1985    CYSTOSCOPY WITH URETEROSCOPY AND STENT PLACEMENT Left 06/18/2013   Procedure: CYSTOSCOPY WITH Lef URETEROSCOPY AND Left STENT PLACEMENT;  Surgeon: Dutch Gray, MD;  Location: [facility];  Service: Urology;  Laterality: Left;   ESOPHAGOGASTRODUODENOSCOPY (EGD) WITH PROPOFOL N/A 11/13/2016   Procedure: ESOPHAGOGASTRODUODENOSCOPY (EGD) WITH PROPOFOL;  Surgeon: Otis Brace, MD;  Location: [place];  Service: Gastroenterology;  Laterality: N/A;   EUS N/A 02/07/2016   Procedure: ESOPHAGEAL ENDOSCOPIC ULTRASOUND (EUS) RADIAL;  Surgeon: Arta Silence, MD;  Location: [facility];  Service: Endoscopy;  Laterality: N/A;   EYE SURGERY Bilateral 2014   ioc for catracts    FOOT SURGERY Left 1990   something with toes unsure what    HERNIA REPAIR  2018   IR EMBO TUMOR ORGAN ISCHEMIA INFARCT INC GUIDE ROADMAPPING  08/22/2021   IR RADIOLOGIST EVAL & MGMT  05/18/2021   IR RENAL SUPRASEL UNI S&I MOD SED  08/22/2021   IR US GUIDE VASC ACCESS RIGHT  08/22/2021   LEFT HEART CATH AND CORONARY ANGIOGRAPHY N/A 08/27/2021   Procedure: LEFT HEART CATH AND CORONARY ANGIOGRAPHY;  Surgeon: Burnell Blanks, MD;  Location: [place] [facility];  Service: Cardiovascular;  Laterality: N/A;   PARATHYROID EXPLORATION     PERIPHERAL VASCULAR BALLOON ANGIOPLASTY Left 08/30/2021   Procedure: PERIPHERAL VASCULAR BALLOON ANGIOPLASTY;  Surgeon: Marty Heck, MD;  Location: [place] [facility];  Service: Cardiovascular;  Laterality: Left;  arm fistula   RADIOLOGY WITH ANESTHESIA Left 08/22/2021   Procedure: MICROWAVE ABLATION;  Surgeon: Sandi Mariscal, MD;  Location: [facility];  Service: Anesthesiology;  Laterality: Left;   WHIPPLE PROCEDURE N/A 04/23/2016   Procedure: WHIPPLE PROCEDURE;  Surgeon: Stark Klein, MD;  Location: [place];  Service: General;  Laterality: N/A;     Home Medications:  Prior to Admission medications   Medication Sig Start Date End Date Taking? Authorizing Provider  acetaminophen (TYLENOL 8 HOUR  ARTHRITIS PAIN) 650 MG CR tablet Take 1,300 mg by mouth 2 (two) times daily as needed for pain.   Yes [provider]  apixaban (ELIQUIS) 2.5 MG TABS tablet Take 1 tablet (2.5 mg total) by mouth 2 (two) times daily. 08/30/21 09/29/21 Yes Vashti Hey, MD  Cholecalciferol (VITAMIN D-3 PO) Take 1 capsule by mouth every Monday, Wednesday, and Friday with hemodialysis.   Yes [provider]  colestipol (COLESTID) 1 g tablet Take 1 g by mouth See admin instructions. 1 tablet (1g) prior to dialysis M-W-F + 1 tablet daily PRN diarrhea   Yes [provider]  diclofenac sodium (VOLTAREN) 1 % GEL Apply 2 g topically 4 (four) times daily as needed (for hand pain).    Yes [provider]  diphenhydramine-acetaminophen (TYLENOL PM) 25-500 MG TABS tablet Take 1 tablet by mouth at bedtime as needed (sleep, pain).   Yes [provider]  escitalopram (LEXAPRO) 20 MG tablet Take 20 mg by mouth at bedtime.   Yes [provider]  insulin aspart (NOVOLOG FLEXPEN) 100 UNIT/ML FlexPen Inject 10 Units into the skin 3 (three) times daily as needed (CBG > 200).   Yes [provider]  lidocaine-prilocaine (EMLA) cream Apply 1 Application topically every Monday, Wednesday, and Friday with hemodialysis.   Yes [provider]  lipase/protease/amylase (CREON) 36000 UNITS CPEP capsule Take 1 capsule (36,000 Units total) by mouth with snacks. Patient taking differently: Take 36,000-72,000 Units by mouth See admin instructions. 2 capsules(72000 units) TID with meals AND 1 capsule(36000 units) with snacks 08/30/21 11/28/21 Yes Chatterjee, Kyra Searles, MD  metoprolol succinate (TOPROL-XL) 25 MG 24 hr tablet Take 0.5 tablets (12.5 mg total) by mouth at bedtime. 08/30/21 09/29/21 Yes Vashti Hey, MD  midodrine (PROAMATINE) 10 MG tablet Take 1 tablet (10 mg total) by mouth daily. Patient taking differently: Take 10 mg by mouth in the morning.  08/30/21 09/29/21 Yes Vashti Hey, MD  oxyCODONE-acetaminophen (PERCOCET) 5-325 MG tablet Take 1 tablet by mouth every 6 (six) hours as needed for severe pain. 08/23/21  Yes Lura Em, PA  sevelamer carbonate (RENVELA) 800 MG tablet Take 1,600 mg by mouth 3 (three) times daily with meals.   Yes [provider]    Inpatient Medications: Scheduled Meds:  apixaban  2.5 mg Oral BID   calcitRIOL  1 mcg Oral Q M,W,F-HD   Chlorhexidine Gluconate Cloth  6 each Topical Q0600   colestipol  1 g Oral BID   diclofenac Sodium  4 g Topical QID   escitalopram  20 mg Oral QHS   folic acid  1 mg Oral Daily   insulin aspart  0-6 Units Subcutaneous TID WC   insulin aspart  10 Units Subcutaneous TID WC   lipase/protease/amylase  72,000 Units Oral TID with meals   metoprolol succinate  12.5 mg Oral Daily   midodrine  10 mg Oral Daily   multivitamin with minerals  1 tablet Oral QHS   sevelamer carbonate  800 mg Oral TID WC   sodium chloride flush  3 mL Intravenous Q12H   sodium chloride flush  3 mL Intravenous Q12H   thiamine  100 mg Oral Daily   Continuous Infusions:  sodium chloride     PRN Meds: sodium chloride, acetaminophen **OR** acetaminophen, alteplase, heparin, levalbuterol, lidocaine (PF), lidocaine-prilocaine, lipase/protease/amylase, oxyCODONE, pentafluoroprop-tetrafluoroeth, polyethylene glycol, sodium chloride flush  Allergies:    Allergies  Allergen Reactions   Lopid [Gemfibrozil] Other (See Comments)    Myalgia    Lotemax [Loteprednol Etabonate] Rash    Skin burning    Statins Other (See Comments)    Muscle weakness   Norco [Hydrocodone-Acetaminophen] Other (See Comments)    Headaches  Tolerates acetaminophen    Other Diarrhea    Real Butter - diarrhea   Tomato Other (See Comments)    Belching    Vibra-Tab [Doxycycline] Other (See Comments)    Unknown reaction   Codeine Other (See Comments)    headache   Hydrocodone-Acetaminophen Other (See  Comments)    Headache. Tolerates acetaminophen.    Social History:   Social History   Socioeconomic History   Marital status: Widowed    Spouse name: Not on file   Number of children: Not on file   Years of education: Not on file   Highest education level: Not on file  Occupational History   Not on file  Tobacco Use   Smoking status: Never   Smokeless tobacco: Never  Vaping Use   Vaping Use: Never used  Substance and Sexual Activity   Alcohol use: No   Drug use: No   Sexual activity: Not on file  Other Topics Concern   Not on file  Social History Narrative   Not on file   Social Determinants of Health   Financial Resource Strain: Not on file  Food Insecurity: Not on file  Transportation Needs: Not on file  Physical Activity: Not on file  Stress: Not on file  Social Connections: Not on file  Intimate Partner Violence: Not on file    Family History:    Family History  Problem Relation Age of Onset   Heart disease Mother    Stroke Mother    Cancer Father        colon   Alzheimer's disease Sister    Alzheimer's disease Sister    Cancer Brother        brain   Breast cancer Neg Hx      ROS:  Please see the history of present illness.  Soll other ROS reviewed and negative.     Physical Exam/Data:   Vitals:   09/07/21 1300 09/07/21 1330 09/07/21 1400 09/07/21 1430  BP: 131/83 (!) 141/77 135/90 139/77  Pulse: (!) 107 (!) 102 72 (!) 115  Resp: '20 19 19 18  '$ Temp:      TempSrc:      SpO2: 100% 100% 100% 100%  Weight:      Height:       No intake or output data in the 24 hours ending 09/07/21  1444    09/07/2021   12:02 PM 09/07/2021    7:55 AM 08/31/2021    6:10 AM  Last 3 Weights  Weight (lbs) 130 lb 11.7 oz 131 lb 126 lb 12.8 oz  Weight (kg) 59.3 kg 59.421 kg 57.516 kg     Body mass index is 23.91 kg/m.  General:  Well nourished, well developed, in no acute distress HEENT: normal Neck: no JVD Vascular: No carotid bruits; Distal pulses 2+  bilaterally Cardiac:   Irregularly irregularno murmur  Lungs:  clear to auscultation bilaterally, no wheezing, rhonchi or rales  Abd: soft, nontender, no hepatomegaly  Ext: no edema Musculoskeletal:  No deformities, BUE and BLE strength normal and equal Skin: warm and dry  Neuro:  CNs 2-12 intact, no focal abnormalities noted Psych:  Normal affect   EKG:  The EKG was personally reviewed and demonstrates: Atrial fibrillation with RVR Telemetry:  Telemetry was personally reviewed and demonstrates: Atrial fibrillation with heart rate 100-1 tens  Relevant CV Studies:  Echocardiogram 08/27/2021  1. Right ventricular systolic function is moderately reduced. The right  ventricular size is mildly enlarged. There is moderately elevated  pulmonary artery systolic pressure. The estimated right ventricular  systolic pressure is 46.9 mmHg. Normal apical  function with free wall hypokinesis, consistent with McConnell's sign as  can be seen in acute PE (image 58). Consider evaluation for PE.   2. Left ventricular ejection fraction, by estimation, is 60 to 65%. The  left ventricle has normal function. The left ventricle has no regional  wall motion abnormalities. Left ventricular diastolic parameters are  consistent with Grade III diastolic  dysfunction (restrictive). Elevated left atrial pressure.   3. Left atrial size was severely dilated.   4. Right atrial size was severely dilated.   5. The mitral valve is degenerative. Mild mitral valve regurgitation. No  evidence of mitral stenosis. Moderate mitral annular calcification.   6. Tricuspid valve regurgitation is mild to moderate.   7. The aortic valve is tricuspid. Aortic valve regurgitation is not  visualized. Aortic valve sclerosis is present, with no evidence of aortic  valve stenosis.   8. The inferior vena cava is dilated in size with <50% respiratory  variability, suggesting right atrial pressure of 15 mmHg.   Laboratory Data:  High  Sensitivity Troponin:   Recent Labs  Lab 08/25/21 1917 08/25/21 2118 09/05/21 1837 09/05/21 2030  TROPONINIHS 1,098* 925* 32* 32*     Chemistry Recent Labs  Lab 09/05/21 1837 09/07/21 0521  NA 137 135  K 3.4* 3.7  CL 96* 98  CO2 29 27  GLUCOSE 270* 138*  BUN 16 31*  CREATININE 2.67* 4.39*  CALCIUM 8.3* 8.7*  GFRNONAA 17* 9*  ANIONGAP 12 10    Recent Labs  Lab 09/05/21 1837 09/07/21 0521  PROT 5.8*  --   ALBUMIN 2.3* 2.1*  AST 18  --   ALT 19  --   ALKPHOS 83  --   BILITOT 0.3  --    Lipids No results for input(s): "CHOL", "TRIG", "HDL", "LABVLDL", "LDLCALC", "CHOLHDL" in the last 168 hours.  Hematology Recent Labs  Lab 09/05/21 1837 09/06/21 1132 09/07/21 0521  WBC 18.1*  --  15.7*  RBC 3.12* 2.91* 2.86*  HGB 8.8*  --  8.1*  HCT 29.0*  --  26.2*  MCV 92.9  --  91.6  MCH 28.2  --  28.3  MCHC 30.3  --  30.9  RDW 16.0*  --  16.3*  PLT  586*  --  503*   Thyroid No results for input(s): "TSH", "FREET4" in the last 168 hours.  BNP Recent Labs  Lab 09/05/21 2118  BNP 2,213.9*    DDimer No results for input(s): "DDIMER" in the last 168 hours.   Radiology/Studies:  CT ABDOMEN PELVIS WO CONTRAST  Result Date: 09/06/2021 CLINICAL DATA:  Kidney cancer, status post ablation. Shortness of breath and dizziness. EXAM: CT ABDOMEN AND PELVIS WITHOUT CONTRAST TECHNIQUE: Multidetector CT imaging of the abdomen and pelvis was performed following the standard protocol without IV contrast. RADIATION DOSE REDUCTION: This exam was performed according to the departmental dose-optimization program which includes automated exposure control, adjustment of the mA and/or kV according to patient size and/or use of iterative reconstruction technique. COMPARISON:  08/25/2021. FINDINGS: Lower chest: The heart is normal in size and there is a trace pericardial effusion. Coronary artery calcifications are noted. There is a small pleural effusion on the left. Atelectasis is noted at the  lung bases bilaterally. Hepatobiliary: No focal liver abnormality is seen. Status post cholecystectomy. No biliary dilatation. Pancreas: The pancreas is not well delineated due to lack of IV contrast. The patient is status post Whipple procedure by history with pancreatic atrophy. Spleen: Normal in size without focal abnormality. Adrenals/Urinary Tract: No adrenal nodule or mass. Multiple cysts and hyperdense lesions are present in the kidneys bilaterally and not well evaluated due to lack of IV contrast. The hyperdense lesions in the left kidney are new from the previous exam. A previously described enhancing solid lesion in the medial aspect of the lower pole of the left kidney has undergone recent ablation and coil embolization by history. Increased perinephric fat stranding is noted on the left, possible edema versus blood products. Evaluation for residual neoplasm is limited due to lack of IV contrast. A few renal calculi are noted bilaterally. No hydronephrosis. The bladder is decompressed. Stomach/Bowel: Postsurgical changes are noted in the upper abdomen, consistent with Whipple procedure. Appendix is not seen. No evidence of bowel wall thickening, distention, or inflammatory changes. No free air or pneumatosis. Diverticula are noted along the sigmoid colon without evidence of diverticulitis. Vascular/Lymphatic: Aortic atherosclerosis. No enlarged abdominal or pelvic lymph nodes. Reproductive: Status post hysterectomy. No adnexal masses. Other: Multifocal fat containing hernias in the anterior abdominal wall in the midline superior to the umbilicus. No abdominopelvic ascites. Musculoskeletal: Degenerative changes are present in the thoracolumbar spine. No acute or suspicious osseous abnormality. IMPRESSION: 1. Status post renal mass ablation with embolization with stable mass in the medial aspect of the left kidney. Evaluation is limited due to lack of IV contrast. Increased perinephric fat stranding on the  left may represent edema or blood products. New hyperdense lesions are noted in the left kidney suggesting hemorrhagic or proteinaceous cysts. 2. Small left pleural effusion with atelectasis at the lung bases. 3. Aortic atherosclerosis. 4. Remaining incidental findings as described above. Electronically Signed   By: Brett Fairy M.D.   On: 09/06/2021 03:52   DG Chest 2 View  Result Date: 09/05/2021 CLINICAL DATA:  Shortness of breath EXAM: CHEST - 2 VIEW COMPARISON:  11/12/2016 FINDINGS: The heart size and mediastinal contours are within normal limits. Both lungs are clear. The visualized skeletal structures are unremarkable. IMPRESSION: No active cardiopulmonary disease. Electronically Signed   By: Ulyses Jarred M.D.   On: 09/05/2021 19:39     Assessment and Plan:   Paroxysmal atrial fibrillation with RVR  -Patient was recently diagnosed with PAF during hospitalization on 08/25/2021.  She was placed on rate control therapy and spontaneously converted to sinus rhythm by hospital day #2.  EF normal, cardiac catheterization showed mild nonobstructive disease.  She was discharged on 12.5 mg daily Toprol-XL and 2.5 mg twice a day of Eliquis  -Family noticed her heart rate remain elevated after dialysis on 09/05/2021, when she came back to the hospital ED, she was noted to be back in atrial fibrillation.  -It is unclear if recurrent A-fib was precipitated by dialysis or could there be a underlying infection source given leukocytosis and lactic acid, however she likely will have frequent recurrence given the severely dilated left atrium on the previous echocardiogram.  -Unable to uptitrate rate control therapy given episodes of severe hypotension after dialysis.  Will discuss with MD, potentially take the patient off of metoprolol entirely and place her on amiodarone instead.  Orthostatic hypotension: Since she has started on dialysis earlier this year, she has been having worsening orthostatic hypotension  especially on dialysis days.  She is currently on midodrine 10 mg daily, although patient says she has been taking 10 mg twice a day at home with possible additional dose in mid day if blood pressure runs low  Leukocytosis: Unclear if has a source infection.  Arrived with white blood cell count of 18, improved to 15 this morning.  White blood cell count was normal during recent hospitalization when she had A-fib.  Received antibiotic.  Lactic acidosis: Lactic acid 2.2--> 2.5.  Managed per primary team  Hyperlipidemia: Intolerant of statins  DM2: Managed by primary team.  On insulin at home  CAD: Recently admitted to the hospital with A-fib with RVR and elevated troponin, underwent cardiac catheterization that showed mild nonobstructive disease  History of CVA: No recent recurrence  Left renal cell carcinoma s/p recent embolization  History of Whipple surgery in 2014    Risk Assessment/Risk Scores:          CHA2DS2-VASc Score = 7   This indicates a 11.2% annual risk of stroke. The patient's score is based upon: CHF History: 0 HTN History: 0 Diabetes History: 1 Stroke History: 2 Vascular Disease History: 1 Age Score: 2 Gender Score: 1         For questions or updates, please contact [place] Please consult [email or web address] for contact info under    Kim Burns, [place]  09/07/2021 2:44 PM  Patient seen and examined with Almyra Deforest, PA.  Agree as above, with the following exceptions and changes as noted below.  Kim Burns is an 86 year old female with end-stage renal disease on dialysis started this year and recently diagnosed atrial fibrillation on anticoagulation, with a history of left renal cell carcinoma, and status post Whipple procedure.  We were consulted for atrial fibrillation however the patient is now in sinus rhythm after dialysis.  Gen: NAD, CV: RRR, no murmurs, Lungs: clear, Abd: soft, Extrem: Warm, well perfused, no edema, Neuro/Psych: alert and oriented x  3, normal mood and affect. All available labs, radiology testing, previous records reviewed.  Seen at the bedside with her son and daughter who ask pertinent questions and provide collaborative history.  We discussed options for rate and rhythm control in the setting of exacerbating factors such as dialysis and an elevated white blood cell count without clear source of infection.  We discussed short-term use of amiodarone and I will initiate amiodarone 200 mg renally dosed but likely twice daily.  To avoid any risk for hypotension will temporarily hold her Toprol XL unless  she needs it for rate control for recurrent atrial fibrillation.  She does have significant hypotension.  In addition there has been question of a restrictive cardiomyopathy, will obtain myeloma panel to initially screen for amyloidosis.  We discussed that this may come back after hospital dismissal and can be followed up as an outpatient.    Elouise Munroe, MD 09/07/21 6:47 PM

## 2021-09-07 NOTE — ED Notes (Signed)
[place] daughter [phone] requesting an update on the patient

## 2021-09-08 DIAGNOSIS — I4891 Unspecified atrial fibrillation: Secondary | ICD-10-CM | POA: Diagnosis not present

## 2021-09-08 LAB — GLUCOSE, CAPILLARY
Glucose-Capillary: 146 mg/dL — ABNORMAL HIGH (ref 70–99)
Glucose-Capillary: 149 mg/dL — ABNORMAL HIGH (ref 70–99)

## 2021-09-08 MED ORDER — AMIODARONE HCL 200 MG PO TABS: 200.0000 mg | ORAL_TABLET | Freq: Every day | ORAL | Status: DC

## 2021-09-08 MED ORDER — AMIODARONE HCL 200 MG PO TABS
200.0000 mg | ORAL_TABLET | Freq: Two times a day (BID) | ORAL | Status: DC
Start: 2021-09-08 — End: 2021-09-08

## 2021-09-08 MED ORDER — AMIODARONE HCL 200 MG PO TABS: ORAL_TABLET | ORAL | 0 refills | Status: DC

## 2021-09-08 NOTE — Discharge Instructions (Signed)
Kim Burns,  You were in the hospital with issues with your atrial fibrillation. Your medications have been adjusted. Please follow-up with your PCP and your Cardiologist.

## 2021-09-08 NOTE — Progress Notes (Signed)
[facility] Progress Note     [facility] Progress Note   Subjective: Up in chair. No C/Os. SR on monitor rate 70s. No issues with HD 09/07/2021  Objective Vitals:   09/08/21 0024 09/08/21 0456 09/08/21 0750 09/08/21 0811  BP: 118/62 119/65 130/61 131/69  Pulse: 71 71 79 73  Resp: '16 16 16 16  '$ Temp: 98.8 F (37.1 C) 98.3 F (36.8 C) 98 F (36.7 C)   TempSrc: Oral Oral Oral   SpO2: 97% 98% 92% 100%  Weight:  58.1 kg    Height:       Physical Exam General: Pleasant elderly female in NAD Heart: W9,U0 2/6 systolic M. SR on monitor.  Lungs: CTAB Abdomen:Soft, NABS.  Extremities:No LE edema Dialysis Access: L AVF + T/B   Additional Objective Labs: Basic Metabolic Panel: Recent Labs  Lab 09/05/21 1837 09/07/21 0521  NA 137 135  K 3.4* 3.7  CL 96* 98  CO2 29 27  GLUCOSE 270* 138*  BUN 16 31*  CREATININE 2.67* 4.39*  CALCIUM 8.3* 8.7*  PHOS  --  3.7   Liver Function Tests: Recent Labs  Lab 09/05/21 1837 09/07/21 0521  AST 18  --   ALT 19  --   ALKPHOS 83  --   BILITOT 0.3  --   PROT 5.8*  --   ALBUMIN 2.3* 2.1*   No results for input(s): "LIPASE", "AMYLASE" in the last 168 hours. CBC: Recent Labs  Lab 09/05/21 1837 09/07/21 0521  WBC 18.1* 15.7*  HGB 8.8* 8.1*  HCT 29.0* 26.2*  MCV 92.9 91.6  PLT 586* 503*   Blood Culture    Component Value Date/Time   SDES BLOOD RIGHT ANTECUBITAL 09/05/2021 2115   SPECREQUEST  09/05/2021 2115    BOTTLES DRAWN AEROBIC AND ANAEROBIC Blood Culture adequate volume   CULT  09/05/2021 2115    NO GROWTH 3 DAYS Performed at [facility], [place] [address], [place], [place] [address]    REPTSTATUS PENDING 09/05/2021 2115    Cardiac Enzymes: No results for input(s): "CKTOTAL", "CKMB", "CKMBINDEX", "TROPONINI" in the last 168 hours. CBG: Recent Labs  Lab 09/07/21 0538 09/07/21 0731 09/07/21 1720 09/07/21 2155 09/08/21 0758  GLUCAP 144* 131* 129* 182* 146*   Iron  Studies:  Recent Labs    09/06/21 1132  IRON 45  TIBC 197*  FERRITIN 713*   '@lablastinr3'$ @ Studies/Results: No results found. Medications:  sodium chloride      amiodarone  200 mg Oral BID   apixaban  2.5 mg Oral BID   calcitRIOL  1 mcg Oral Q M,W,F-HD   colestipol  1 g Oral BID   diclofenac Sodium  4 g Topical QID   escitalopram  20 mg Oral QHS   folic acid  1 mg Oral Daily   insulin aspart  0-6 Units Subcutaneous TID WC   insulin aspart  10 Units Subcutaneous TID WC   lipase/protease/amylase  72,000 Units Oral TID with meals   midodrine  10 mg Oral Daily   multivitamin with minerals  1 tablet Oral QHS   sevelamer carbonate  800 mg Oral TID WC   sodium chloride flush  3 mL Intravenous Q12H   sodium chloride flush  3 mL Intravenous Q12H   thiamine  100 mg Oral Daily     Dialysis Orders: MWF- [facility] 3hrs35mn, BFR 350, DFR Auto 1.5, EDW 57.5kg Access: L AVF  Heparin 2000 units IV bolus with HD Calcitriol 137m  PO qHD- last 09/05/21 Venofer '100mg'$  IV X 7 doses-already received 3 doses, last dose 09/05/21   Assessment/Plan: 1. Symptomatic Afib/RVR-Recently discharged on Toprol-XL.  Management titration per primary team and cardiology if needed 2.  Leukocytosis: Borderline fevers. BC negative. Off ABX. UC with multiple species.  Work-up and management per primary team 2. ESRD -on HD MWF.  Plan for dialysis today per schedule 3. Anemia of CKD- Hgb now 8.1. Holding ESA 2nd RCC. Iron sat 23 and ferritin 713. Holding iron given potential infection. CTM for now. 4. Secondary hyperparathyroidism - Will continue Calcitriol. Phos normal on home sevelamer 5. HTN/volume - Noted SBPs in 120s today, continue Midodrine for now. Euvolemic on exam. CXR unremarkable. 6. Nutrition - Renal diet with fluid restriction  Kim Burns H. Kim Fero NP-C 09/08/2021, 9:27 AM  [employer] [phone]

## 2021-09-08 NOTE — Evaluation (Addendum)
Occupational Therapy Evaluation Patient Details Name: Kim Burns MRN: [id number] DOB: December 28, 1935 Today's Date: 09/08/2021   History of Present Illness 86 yo female admitted 7/19 for weakness from apparently hypotension, complicated by a-fib wtih  RVR.  On telemetry after management of symptoms but had recent admit for vision changes with + MRI findings of brain.  PMHx:  HTN, whipple procedure, ESRD, GERD, TIA, DM   Clinical Impression   Pt admitted for concerns listed above. PTA pt reported that she was independent with all ADL's and IADL's, including driving. At this time, pt presents with decreased activity tolerance and weakness. She is requiring rest breaks after 5 mins of activity which is different than her baseline. OT provided education on energy conservation techniques that she can utilize at home with her ADL's and IADL's. Pt has no further skilled OT needs at this time and acute OT will sign off.       Recommendations for follow up therapy are one component of a multi-disciplinary discharge planning process, led by the attending physician.  Recommendations may be updated based on patient status, additional functional criteria and insurance authorization.   Follow Up Recommendations  No OT follow up    Assistance Recommended at Discharge Set up Supervision/Assistance  Patient can return home with the following A little help with walking and/or transfers;A little help with bathing/dressing/bathroom;Help with stairs or ramp for entrance    Functional Status Assessment  Patient has had a recent decline in their functional status and demonstrates the ability to make significant improvements in function in a reasonable and predictable amount of time.  Equipment Recommendations  None recommended by OT    Recommendations for Other Services       Precautions / Restrictions Precautions Precautions: Fall Precaution Comments: monitor vitals Restrictions Weight Bearing  Restrictions: No      Mobility Bed Mobility Overal bed mobility: Modified Independent                  Transfers Overall transfer level: Modified independent Equipment used: Rolling walker (2 wheels)                      Balance Overall balance assessment: Needs assistance Sitting-balance support: Feet supported Sitting balance-Leahy Scale: Good     Standing balance support: Bilateral upper extremity supported, During functional activity Standing balance-Leahy Scale: Fair Standing balance comment: less than fair dynamicallly                           ADL either performed or assessed with clinical judgement   ADL Overall ADL's : At baseline                                       General ADL Comments: Pt able to complete ADL's and functional mobility with no assist. Increased time as needed for rest breaks due to quick fatigue.     Vision Baseline Vision/History: 1 Wears glasses Ability to See in Adequate Light: 0 Adequate Patient Visual Report: No change from baseline Vision Assessment?: No apparent visual deficits     Perception     Praxis      Pertinent Vitals/Pain Pain Assessment Pain Assessment: No/denies pain     Hand Dominance Right   Extremity/Trunk Assessment Upper Extremity Assessment Upper Extremity Assessment: Overall WFL for tasks assessed   Lower Extremity Assessment  Lower Extremity Assessment: Overall WFL for tasks assessed   Cervical / Trunk Assessment Cervical / Trunk Assessment: Normal   Communication Communication Communication: No difficulties   Cognition Arousal/Alertness: Awake/alert Behavior During Therapy: WFL for tasks assessed/performed Overall Cognitive Status: Within Functional Limits for tasks assessed                                       General Comments  VSS on RA    Exercises Exercises: Other exercises (LE strength grossly WFL)   Shoulder Instructions       Home Living Family/patient expects to be discharged to:: Private residence Living Arrangements: Alone Available Help at Discharge: Family;Available PRN/intermittently Type of Home: House Home Access: Stairs to enter Entrance Stairs-Number of Steps: 13 Entrance Stairs-Rails: Left Home Layout: Two level;Able to live on main level with bedroom/bathroom     Bathroom Shower/Tub: Tub/shower unit;Walk-in shower   Bathroom Toilet: Handicapped height Bathroom Accessibility: Yes   Home Equipment: Conservation officer, nature (2 wheels);Cane - single point   Additional Comments: has family in town to assist      Prior Functioning/Environment Prior Level of Function : Independent/Modified Independent             Mobility Comments: RW most of the time in the house          OT Problem List: Decreased strength;Decreased activity tolerance;Impaired balance (sitting and/or standing);Cardiopulmonary status limiting activity      OT Treatment/Interventions:      OT Goals(Current goals can be found in the care plan section) Acute Rehab OT Goals Patient Stated Goal: To go home OT Goal Formulation: With patient Time For Goal Achievement: 09/08/21 Potential to Achieve Goals: Good  OT Frequency:      Co-evaluation              AM-PAC OT "6 Clicks" Daily Activity     Outcome Measure Help from another person eating meals?: None Help from another person taking care of personal grooming?: None Help from another person toileting, which includes using toliet, bedpan, or urinal?: A Little Help from another person bathing (including washing, rinsing, drying)?: A Little Help from another person to put on and taking off regular upper body clothing?: None Help from another person to put on and taking off regular lower body clothing?: None 6 Click Score: 22   End of Session Nurse Communication: Mobility status  Activity Tolerance: Patient tolerated treatment well Patient left: Other (comment)  (Sitting EOB)  OT Visit Diagnosis: Unsteadiness on feet (R26.81);Other abnormalities of gait and mobility (R26.89);Muscle weakness (generalized) (M62.81)                Time: 3810-1751 OT Time Calculation (min): 31 min Charges:  OT General Charges $OT Visit: 1 Visit OT Evaluation $OT Eval Moderate Complexity: 1 Mod OT Treatments $Self Care/Home Management : 8-22 mins  Teighan Aubert H., OTR/L Acute Rehabilitation  Gaye Scorza Elane Yolanda Bonine 09/08/2021, 4:15 PM

## 2021-09-08 NOTE — Progress Notes (Signed)
Pt safely discharged. Discharge packet provided with teach-back method. VS  as per flow. IVs removed, Pt verbalized understanding. All questions and concerns addressed. Awaiting on daughter for transport.

## 2021-09-08 NOTE — Progress Notes (Signed)
Dr. Quentin Ore is signing off, chart reviewed for follow-up. Originally someone from [facility] had scheduled OV 8/8 in [facility] but pt usually f/u at [facility]. Have r/s to 8/2 with Isaac Laud, whom she met in the hospital. Spoke with pt via phone to confirm she knows which location to come to.

## 2021-09-08 NOTE — Evaluation (Signed)
Physical Therapy Evaluation and Discharge Patient Details Name: Kim Burns MRN: [id number] DOB: [birth year] Today's Date: 09/08/2021  History of Present Illness  86 yo female admitted 7/19 for weakness from apparently hypotension, complicated by a-fib wtih  RVR.  On telemetry after management of symptoms but had recent admit for vision changes with + MRI findings of brain.  PMHx:  HTN, whipple procedure, ESRD, GERD, TIA, DM  Clinical Impression  Pt was seen for a review of gait and stairs, which she performs without any real support other than for safety in initial check of movement.  Her plan is to have pt return home with her family support as has been available to her, and to follow up with her MD.  PT will discharge services now based on pt demonstrating all skills for mobility.       Recommendations for follow up therapy are one component of a multi-disciplinary discharge planning process, led by the attending physician.  Recommendations may be updated based on patient status, additional functional criteria and insurance authorization.  Follow Up Recommendations No PT follow up      Assistance Recommended at Discharge Set up Supervision/Assistance  Patient can return home with the following  Assist for transportation    Equipment Recommendations None recommended by PT  Recommendations for Other Services       Functional Status Assessment Patient has not had a recent decline in their functional status     Precautions / Restrictions Precautions Precautions: Fall Precaution Comments: monitor vitals Restrictions Weight Bearing Restrictions: No      Mobility  Bed Mobility Overal bed mobility: Modified Independent                  Transfers Overall transfer level: Modified independent Equipment used: Rolling walker (2 wheels)                    Ambulation/Gait Ambulation/Gait assistance: Supervision Gait Distance (Feet): 60 Feet Assistive device:  Rolling walker (2 wheels) Gait Pattern/deviations: Step-through pattern, Decreased step length - right, Decreased step length - left, Decreased stride length Gait velocity: decreased Gait velocity interpretation: <1.31 ft/sec, indicative of household ambulator Pre-gait activities: Standing balance ck General Gait Details: wide base on RW with no issues of pain or SOB  Stairs Stairs: Yes Stairs assistance: Min guard Stair Management: Two rails, Alternating pattern, Step to pattern, Forwards Number of Stairs: 10 General stair comments: tolerating well with pt taking her time and stepping carefully step to and then alternate steps  Wheelchair Mobility    Modified Rankin (Stroke Patients Only)       Balance Overall balance assessment: Needs assistance Sitting-balance support: Feet supported Sitting balance-Leahy Scale: Good     Standing balance support: Bilateral upper extremity supported, During functional activity Standing balance-Leahy Scale: Fair Standing balance comment: less than fair dynamicallly                             Pertinent Vitals/Pain Pain Assessment Pain Assessment: 0-10 Pain Score: 0-No pain    Home Living Family/patient expects to be discharged to:: Private residence Living Arrangements: Alone Available Help at Discharge: Family;Available PRN/intermittently Type of Home: House Home Access: Stairs to enter Entrance Stairs-Rails: Left Entrance Stairs-Number of Steps: 13   Home Layout: Two level;Able to live on main level with bedroom/bathroom Home Equipment: Rolling Walker (2 wheels);Kim Burns - single point Additional Comments: has family in town to assist    Prior  Function Prior Level of Function : Independent/Modified Independent             Mobility Comments: RW most of the time in the house       Hand Dominance   Dominant Hand: Right    Extremity/Trunk Assessment   Upper Extremity Assessment Upper Extremity Assessment:  Overall WFL for tasks assessed    Lower Extremity Assessment Lower Extremity Assessment: Overall WFL for tasks assessed    Cervical / Trunk Assessment Cervical / Trunk Assessment: Normal  Communication   Communication: No difficulties  Cognition Arousal/Alertness: Awake/alert Behavior During Therapy: WFL for tasks assessed/performed Overall Cognitive Status: Within Functional Limits for tasks assessed                                          General Comments General comments (skin integrity, edema, etc.): pt is checked for hypotension and no signs were found includig during gait    Exercises     Assessment/Plan    PT Assessment Patient does not need any further PT services  PT Problem List         PT Treatment Interventions      PT Goals (Current goals can be found in the Care Plan section)  Acute Rehab PT Goals Patient Stated Goal: to get home today PT Goal Formulation: All assessment and education complete, DC therapy    Frequency       Co-evaluation               AM-PAC PT "6 Clicks" Mobility  Outcome Measure Help needed turning from your back to your side while in a flat bed without using bedrails?: None Help needed moving from lying on your back to sitting on the side of a flat bed without using bedrails?: None Help needed moving to and from a bed to a chair (including a wheelchair)?: None Help needed standing up from a chair using your arms (e.g., wheelchair or bedside chair)?: None Help needed to walk in hospital room?: A Little Help needed climbing 3-5 steps with a railing? : A Little 6 Click Score: 22    End of Session Equipment Utilized During Treatment: Gait belt Activity Tolerance: Patient tolerated treatment well Patient left: in bed;with call bell/phone within reach Nurse Communication: Mobility status PT Visit Diagnosis: Unsteadiness on feet (R26.81)    Time: 9485-4627 PT Time Calculation (min) (ACUTE ONLY): 32  min   Charges:   PT Evaluation $PT Eval Moderate Complexity: 1 Mod PT Treatments $Gait Training: 8-22 mins       Ramond Dial 09/08/2021, 1:39 PM  Mee Hives, PT PhD Acute Rehab Dept. Number: [place] and [place]

## 2021-09-08 NOTE — Discharge Summary (Signed)
Physician Discharge Summary   Patient: Kim Burns MRN: [id number] DOB: 08-03-1935  Admit date:     09/05/2021  Discharge date: 09/08/21  Discharge Physician: Cordelia Poche, MD   PCP: [name], MD   Recommendations at discharge:  PCP and Cardiology follow-up Repeat CBC to trend leukocytosis  Discharge Diagnoses: Principal Problem:   Atrial fibrillation with RVR ([place]) Active Problems:   Renal cell carcinoma of left kidney ([place])   Diabetes mellitus type 2, controlled ([place])   Symptomatic anemia  Resolved Problems:   * No resolved hospital problems. *  Assessment and Plan:  Atrial fibrillation with RVR Recently diagnosed. Patient started on metoprolol and Eliquis. Spontaneously reverted back to sinus rhythm prior to recent discharge. Patient reverted into atrial fibrillation. Initially managed on metoprolol with poor control and risk for further hypotension. Cardiology consulted and started amiodarone; metoprolol discontinued. Patient spontaneously reverted to sinus rhythm the day before discharge.   Orthostatic hypotension Chronic. Related to hemodialysis. Possibly exacerbated by atrial fibrillation with RVR. Blood pressure currently normotensive outside of HD. Patient is on metoprolol for rate management of atrial fibrillation. Continue midodrine.   Anemia Normocytic. Appears to be chronic and currently stable. No evidence of bleeding. Patient is on Eliquis. No history of colonoscopy but patient has remained up to date with colon cancer screening with stool card testing.   ESRD on HD Patient received HD on a Monday, Wednesday, Friday schedule. Nephrology consulted for HD while inpatient.   Diabetes mellitus, type 2 Most recent hemoglobin A1C of 6.2%. Continue home Novolog.   Renal cell carcinoma History of ablation. Noted   Chronic CVA Patient is on Eliquis. Continue Eliquis.   CAD Recent heart catheterization significant for nonobstructive disease.    Leukocytosis Patient does not meet sepsis criteria secondary to no source.Patient received empiric vancomycin and Zosyn in the ED which were not continued. Blood cultures obtained and are no growth to date. Urine culture with multiple species. Leukocytosis is improved slightly with no identified infectious source. Recommend repeat CBC as an outpatient.   History of whipple procedure -Continue Creon  Consultants: Nephrology, Cardiology Procedures performed: Hemodialysis  Disposition: Home Diet recommendation: Renal Diet  DISCHARGE MEDICATION: Allergies as of 09/08/2021       Reactions   Lopid [gemfibrozil] Other (See Comments)   Myalgia    Lotemax [loteprednol Etabonate] Rash   Skin burning    Statins Other (See Comments)   Muscle weakness   Norco [hydrocodone-acetaminophen] Other (See Comments)   Headaches  Tolerates acetaminophen    Other Diarrhea   Real Butter - diarrhea   Tomato Other (See Comments)   Belching    Vibra-tab [doxycycline] Other (See Comments)   Unknown reaction   Codeine Other (See Comments)   headache   Hydrocodone-acetaminophen Other (See Comments)   Headache. Tolerates acetaminophen.        Medication List     STOP taking these medications    metoprolol succinate 25 MG 24 hr tablet Commonly known as: TOPROL-XL       TAKE these medications    amiodarone 200 MG tablet Commonly known as: Pacerone Take 1 tablet (200 mg total) by mouth 2 (two) times daily for 6 days, THEN 1 tablet (200 mg total) daily. Start taking on: September 08, 2021   colestipol 1 g tablet Commonly known as: COLESTID Take 1 g by mouth See admin instructions. 1 tablet (1g) prior to dialysis M-W-F + 1 tablet daily PRN diarrhea   diclofenac sodium 1 %  Gel Commonly known as: VOLTAREN Apply 2 g topically 4 (four) times daily as needed (for hand pain).   diphenhydramine-acetaminophen 25-500 MG Tabs tablet Commonly known as: TYLENOL PM Take 1 tablet by mouth at bedtime as  needed (sleep, pain).   Eliquis 2.5 MG Tabs tablet Generic drug: apixaban Take 1 tablet (2.5 mg total) by mouth 2 (two) times daily.   escitalopram 20 MG tablet Commonly known as: LEXAPRO Take 20 mg by mouth at bedtime.   lidocaine-prilocaine cream Commonly known as: EMLA Apply 1 Application topically every Monday, Wednesday, and Friday with hemodialysis.   lipase/protease/amylase 36000 UNITS Cpep capsule Commonly known as: CREON Take 1 capsule (36,000 Units total) by mouth with snacks. What changed:  how much to take when to take this additional instructions   midodrine 10 MG tablet Commonly known as: PROAMATINE Take 1 tablet (10 mg total) by mouth daily. What changed: when to take this   NovoLOG FlexPen 100 UNIT/ML FlexPen Generic drug: insulin aspart Inject 10 Units into the skin 3 (three) times daily as needed (CBG > 200).   oxyCODONE-acetaminophen 5-325 MG tablet Commonly known as: Percocet Take 1 tablet by mouth every 6 (six) hours as needed for severe pain.   sevelamer carbonate 800 MG tablet Commonly known as: RENVELA Take 1,600 mg by mouth 3 (three) times daily with meals.   Tylenol 8 Hour Arthritis Pain 650 MG CR tablet Generic drug: acetaminophen Take 1,300 mg by mouth 2 (two) times daily as needed for pain.   VITAMIN D-3 PO Take 1 capsule by mouth every Monday, Wednesday, and Friday with hemodialysis.        Discharge Exam: BP 131/69 (BP Location: Right Arm)   Pulse 73   Temp 98 F (36.7 C) (Oral)   Resp 16   Ht _0  (1.575 m)   Wt 58.1 kg   SpO2 100%   BMI 23.43 kg/m   General exam: Appears calm and comfortable Respiratory system: Clear to auscultation. Respiratory effort normal. Cardiovascular system: S1 & S2 heard, RRR. No murmurs, rubs, gallops or clicks. Gastrointestinal system: Abdomen is nondistended, soft and nontender. Normal bowel sounds heard. Central nervous system: Alert and oriented. No focal neurological  deficits. Musculoskeletal: No edema. No calf tenderness Skin: No cyanosis. No rashes Psychiatry: Judgement and insight appear normal. Mood & affect appropriate.   Condition at discharge: stable  The results of significant diagnostics from this hospitalization (including imaging, microbiology, ancillary and laboratory) are listed below for reference.   Imaging Studies: CT ABDOMEN PELVIS WO CONTRAST  Result Date: 09/06/2021 CLINICAL DATA:  Kidney cancer, status post ablation. Shortness of breath and dizziness. EXAM: CT ABDOMEN AND PELVIS WITHOUT CONTRAST TECHNIQUE: Multidetector CT imaging of the abdomen and pelvis was performed following the standard protocol without IV contrast. RADIATION DOSE REDUCTION: This exam was performed according to the departmental dose-optimization program which includes automated exposure control, adjustment of the mA and/or kV according to patient size and/or use of iterative reconstruction technique. COMPARISON:  08/25/2021. FINDINGS: Lower chest: The heart is normal in size and there is a trace pericardial effusion. Coronary artery calcifications are noted. There is a small pleural effusion on the left. Atelectasis is noted at the lung bases bilaterally. Hepatobiliary: No focal liver abnormality is seen. Status post cholecystectomy. No biliary dilatation. Pancreas: The pancreas is not well delineated due to lack of IV contrast. The patient is status post Whipple procedure by history with pancreatic atrophy. Spleen: Normal in size without focal abnormality. Adrenals/Urinary Tract: No  adrenal nodule or mass. Multiple cysts and hyperdense lesions are present in the kidneys bilaterally and not well evaluated due to lack of IV contrast. The hyperdense lesions in the left kidney are new from the previous exam. A previously described enhancing solid lesion in the medial aspect of the lower pole of the left kidney has undergone recent ablation and coil embolization by history.  Increased perinephric fat stranding is noted on the left, possible edema versus blood products. Evaluation for residual neoplasm is limited due to lack of IV contrast. A few renal calculi are noted bilaterally. No hydronephrosis. The bladder is decompressed. Stomach/Bowel: Postsurgical changes are noted in the upper abdomen, consistent with Whipple procedure. Appendix is not seen. No evidence of bowel wall thickening, distention, or inflammatory changes. No free air or pneumatosis. Diverticula are noted along the sigmoid colon without evidence of diverticulitis. Vascular/Lymphatic: Aortic atherosclerosis. No enlarged abdominal or pelvic lymph nodes. Reproductive: Status post hysterectomy. No adnexal masses. Other: Multifocal fat containing hernias in the anterior abdominal wall in the midline superior to the umbilicus. No abdominopelvic ascites. Musculoskeletal: Degenerative changes are present in the thoracolumbar spine. No acute or suspicious osseous abnormality. IMPRESSION: 1. Status post renal mass ablation with embolization with stable mass in the medial aspect of the left kidney. Evaluation is limited due to lack of IV contrast. Increased perinephric fat stranding on the left may represent edema or blood products. New hyperdense lesions are noted in the left kidney suggesting hemorrhagic or proteinaceous cysts. 2. Small left pleural effusion with atelectasis at the lung bases. 3. Aortic atherosclerosis. 4. Remaining incidental findings as described above. Electronically Signed   By: Brett Fairy M.D.   On: 09/06/2021 03:52   DG Chest 2 View  Result Date: 09/05/2021 CLINICAL DATA:  Shortness of breath EXAM: CHEST - 2 VIEW COMPARISON:  11/12/2016 FINDINGS: The heart size and mediastinal contours are within normal limits. Both lungs are clear. The visualized skeletal structures are unremarkable. IMPRESSION: No active cardiopulmonary disease. Electronically Signed   By: Ulyses Jarred M.D.   On: 09/05/2021  19:39   PERIPHERAL VASCULAR CATHETERIZATION  Result Date: 08/30/2021 OPERATIVE NOTE   PROCEDURE: left brachiobascilic arteriovenous fistula cannulation under ultrasound guidance left arm fistulogram including central venogram left basilic vein angioplasty in the mid upper arm (6 mm x 40 mm Mustang, 7 mm x 40 mm Mustang, and 8 mm x 40 mm drug-coated Lutonix)  PRE-OPERATIVE DIAGNOSIS: Malfunctioning left brachiobasclic arteriovenous fistula (prolonged bleeding)  POST-OPERATIVE DIAGNOSIS: same as above  SURGEON: Marty Heck, MD  ANESTHESIA: local  ESTIMATED BLOOD LOSS: 5 cc  FINDING(S): No evidence of central venous stenosis.  The left arm basilic vein fistula had about a 80% stenosis focal in the mid upper arm.  After this was crossed with antegrade access it was angioplastied with a 6 mm x 40 mm Mustang, 7 mm x 40 mm Mustang, and 8 mm x 40 mm drug-coated Lutonix.  Less than 30% residual stenosis.  Excellent thrill at completion compared to pulsatile at start of case.  SPECIMEN(S):  None  CONTRAST: 65 mL  INDICATIONS: AURIEL KIST is a 86 y.o. female who presents with malfunctioning left brachiobasilic arteriovenous fistula with prolonged bleeding.  The patient is scheduled for left arm fistuloogram.  The patient is aware the risks include but are not limited to: bleeding, infection, thrombosis of the cannulated access, and possible anaphylactic reaction to the contrast.  The patient is aware of the risks of the procedure and elects to  proceed forward.  DESCRIPTION: After full informed written consent was obtained, the patient was brought back to the angiography suite and placed supine upon the angiography table.  The patient was connected to monitoring equipment.  The left arm was prepped and draped in the standard fashion for a left arm fistulogram.  Under ultrasound guidance, the left brachiobasilic arteriovenous fistula was evaluated with ultrasound, it was patent, an image was saved.  It was  cannulated with a micropuncture needle.  The microwire was advanced into the fistula and the needle was exchanged for the microsheath, which was lodged 2 cm into the access.  The wire was removed and the sheath was connected to the IV extension tubing.  Hand injections were completed to image the access from the antecubitum up to the level of axilla.  The central venous structures were also imaged by hand injections.  After evaluating images with radiographic interpretation, we elected for intervention.  A Bentson wire was placed in the micro sheath and then we exchanged for a short 7 [place] sheath.  I used a KMP catheter to cross the stenosis in the mid upper arm basilic vein fistula.  We then used a 6 mm x 40 mm Mustang and then 7 mm x 40 mm Mustang to nominal pressure for 2 minutes.  I then exchanged for an 8 mm x 40 mm drug-coated Lutonix to nominal pressure for 3 minutes.  Less than 30% residual stenosis.  Much better thrill.  I did not feel I could be more aggressive.  A 4-0 Monocryl purse-string suture was sewn around the sheath.  The sheath was removed while tying down the suture.  A sterile bandage was applied to the puncture site.  COMPLICATIONS: None  CONDITION: Stable  Marty Heck, MD [facility] Office: [phone]   CT ANGIO HEAD NECK W WO CM  Result Date: 08/29/2021 CLINICAL DATA:  Follow-up examination for stroke. EXAM: CT ANGIOGRAPHY HEAD AND NECK TECHNIQUE: Multidetector CT imaging of the head and neck was performed using the standard protocol during bolus administration of intravenous contrast. Multiplanar CT image reconstructions and MIPs were obtained to evaluate the vascular anatomy. Carotid stenosis measurements (when applicable) are obtained utilizing NASCET criteria, using the distal internal carotid diameter as the denominator. RADIATION DOSE REDUCTION: This exam was performed according to the departmental dose-optimization program which  includes automated exposure control, adjustment of the mA and/or kV according to patient size and/or use of iterative reconstruction technique. CONTRAST:  34m OMNIPAQUE IOHEXOL 350 MG/ML SOLN COMPARISON:  MRI from earlier the same day. FINDINGS: CT HEAD FINDINGS Brain: Generalized age-related cerebral atrophy with mild chronic small vessel ischemic disease. Previously identified punctate ischemic changes not visible by CT. No other acute large vessel territory infarct. No acute intracranial hemorrhage. No mass lesion, mass effect, or midline shift. No hydrocephalus or extra-axial fluid collection. Vascular: No abnormal hyperdense vessel. Calcified atherosclerosis present about the skull base. Skull: Scalp soft tissues and calvarium within normal limits. Sinuses: Scattered mucoperiosteal thickening present about the ethmoidal air cells. Paranasal sinuses are otherwise clear. No significant mastoid effusion. Orbits: Globes orbital soft tissues demonstrate no acute finding. Review of the MIP images confirms the above findings CTA NECK FINDINGS Aortic arch: Visualized aortic arch normal caliber with standard 3 vessel morphology. Mild-to-moderate atheromatous change about the arch itself. No significant stenosis about the origin the great vessels. Right carotid system: Right common and internal carotid arteries are patent without dissection or other acute finding. Eccentric calcified  plaque about the right carotid bulb without hemodynamically significant stenosis. Left carotid system: Left common and internal carotid arteries are patent without dissection or other acute finding. Mild calcified plaque about the left carotid bulb without hemodynamically significant stenosis. Vertebral arteries: Both vertebral arteries arise from subclavian arteries. No proximal subclavian artery stenosis. Right vertebral artery dominant. Vertebral arteries patent without stenosis or dissection. Skeleton: No discrete or worrisome osseous  lesions. Moderate cervical spondylosis at C3-4 through C6-7. Other neck: No other acute soft tissue abnormality within the neck. Upper chest: 3 mm nodule within the superior segment of the left lower lobe noted, stable. Scattered atelectatic changes noted elsewhere within the visualized lungs. Review of the MIP images confirms the above findings CTA HEAD FINDINGS Anterior circulation: Both internal carotid arteries patent to the termini without stenosis. A1 segments patent bilaterally. Normal anterior communicating artery complex. Anterior cerebral arteries patent without stenosis. No M1 stenosis or occlusion. Normal MCA bifurcations. Distal MCA branches perfused and symmetric. Posterior circulation: Both vertebral arteries patent without stenosis. Right vertebral artery dominant. Neither PICA well visualized. Basilar patent to its distal aspect without stenosis. Superior cerebral arteries patent bilaterally. Both PCAs well perfused and patent to their distal aspects. Venous sinuses: Patent allowing for timing the contrast bolus. Anatomic variants: As above.  No aneurysm. Review of the MIP images confirms the above findings IMPRESSION: 1. Negative CTA of the head and neck for large vessel occlusion or other emergent finding. 2. Mild for age atheromatous disease about the aortic arch and carotid bifurcations without hemodynamically significant stenosis. 3. 3 mm left lower lobe nodule, indeterminate. No routine follow-up imaging is recommended per Fleischner Society Guidelines. These guidelines do not apply to immunocompromised patients and patients with cancer. Follow up in patients with significant comorbidities as clinically warranted. For lung cancer screening, adhere to Lung-RADS guidelines. Reference: Radiology. 2017; 284(1):228-43. Electronically Signed   By: Jeannine Boga M.D.   On: 08/29/2021 03:02   MR BRAIN WO CONTRAST  Result Date: 08/28/2021 CLINICAL DATA:  Binocular visual field changes.   Vision loss. EXAM: MRI HEAD WITHOUT CONTRAST TECHNIQUE: Multiplanar, multiecho pulse sequences of the brain and surrounding structures were obtained without intravenous contrast. COMPARISON:  MR head without contrast 03/24/2018 FINDINGS: Brain: Scattered punctate foci of restricted diffusion are noted. A focal lesion is noted along the postcentral gyrus on image 88 of series 5 subcortical punctate focus of restricted diffusion is present in the left parietal lobe on image 83 of series 5 with minimal associated T2 hyperintensity punctate focus is present in the right temporoparietal lobe on image 75 of series 5. A punctate focus is present in the right superior cerebellum on image 62 of series 5. The remote foci of susceptibility most notably along the right splenium of the corpus callosum are stable. Additional lesions are present in the right cerebellum and left frontal lobe. A The internal auditory canals are within normal limits. Other remote lacunar infarcts are present in the cerebellum bilaterally. Brainstem is within normal limits. The ventricles are of normal size. No significant extraaxial fluid collection is present. Vascular: Flow is present in the major intracranial arteries. Skull and upper cervical spine: Degenerative changes of the upper cervical spine have progressed. Craniocervical junction is normal. Marrow signal is normal. Sinuses/Orbits: The paranasal sinuses and mastoid air cells are clear. Bilateral lens replacements are noted. Globes and orbits are otherwise unremarkable. IMPRESSION: 1. Scattered punctate foci of restricted diffusion as described. The finding is nonspecific but can be seen in the setting  of acute/subacute ischemia, a demyelinating process such as multiple sclerosis, vasculitis, complicated migraine headaches, or as the sequelae of a prior infectious or inflammatory process. 2. Other remote lacunar infarcts of the cerebellum bilaterally. 3. Progressive degenerative changes of  the upper cervical spine. Electronically Signed   By: San Morelle M.D.   On: 08/28/2021 15:20   ECHOCARDIOGRAM COMPLETE  Result Date: 08/27/2021    ECHOCARDIOGRAM REPORT   Patient Name:   Autie P Ingraham Date of Exam: 08/27/2021 Medical Rec #:  [id number]        Height:       62.0 in Accession #:    [id number]       Weight:       132.0 lb Date of Birth:  10/14/1935        BSA:          1.602 m Patient Age:    63 years         BP:           106/58 mmHg Patient Gender: F                HR:           78 bpm. Exam Location:  Inpatient Procedure: 2D Echo, Cardiac Doppler and Color Doppler Indications:    NSTEMI I21.4  History:        Patient has prior history of Echocardiogram examinations, most                 recent 08/08/2021. Stroke, Arrythmias:Bradycardia; Risk                 Factors:Diabetes, GERD, Hypertension and Sleep Apnea.  Sonographer:    Bernadene Person RDCS Referring Phys: [id number] [place]  1. Right ventricular systolic function is moderately reduced. The right ventricular size is mildly enlarged. There is moderately elevated pulmonary artery systolic pressure. The estimated right ventricular systolic pressure is 75.9 mmHg. Normal apical function with free wall hypokinesis, consistent with McConnell's sign as can be seen in acute PE (image 58). Consider evaluation for PE.  2. Left ventricular ejection fraction, by estimation, is 60 to 65%. The left ventricle has normal function. The left ventricle has no regional wall motion abnormalities. Left ventricular diastolic parameters are consistent with Grade III diastolic dysfunction (restrictive). Elevated left atrial pressure.  3. Left atrial size was severely dilated.  4. Right atrial size was severely dilated.  5. The mitral valve is degenerative. Mild mitral valve regurgitation. No evidence of mitral stenosis. Moderate mitral annular calcification.  6. Tricuspid valve regurgitation is mild to moderate.  7. The aortic valve is  tricuspid. Aortic valve regurgitation is not visualized. Aortic valve sclerosis is present, with no evidence of aortic valve stenosis.  8. The inferior vena cava is dilated in size with <50% respiratory variability, suggesting right atrial pressure of 15 mmHg. FINDINGS  Left Ventricle: Left ventricular ejection fraction, by estimation, is 60 to 65%. The left ventricle has normal function. The left ventricle has no regional wall motion abnormalities. The left ventricular internal cavity size was normal in size. There is  no left ventricular hypertrophy. Left ventricular diastolic parameters are consistent with Grade III diastolic dysfunction (restrictive). Elevated left atrial pressure. Right Ventricle: The right ventricular size is mildly enlarged. No increase in right ventricular wall thickness. Right ventricular systolic function is moderately reduced. There is moderately elevated pulmonary artery systolic pressure. The tricuspid regurgitant velocity is 2.79 m/s, and with an assumed right atrial  pressure of 15 mmHg, the estimated right ventricular systolic pressure is 16.5 mmHg. Left Atrium: Left atrial size was severely dilated. Right Atrium: Right atrial size was severely dilated. Pericardium: There is no evidence of pericardial effusion. Mitral Valve: The mitral valve is degenerative in appearance. Moderate mitral annular calcification. Mild mitral valve regurgitation. No evidence of mitral valve stenosis. Tricuspid Valve: The tricuspid valve is normal in structure. Tricuspid valve regurgitation is mild to moderate. Aortic Valve: The aortic valve is tricuspid. Aortic valve regurgitation is not visualized. Aortic valve sclerosis is present, with no evidence of aortic valve stenosis. Pulmonic Valve: The pulmonic valve was not well visualized. Pulmonic valve regurgitation is not visualized. Aorta: The aortic root and ascending aorta are structurally normal, with no evidence of dilitation. Venous: The inferior vena  cava is dilated in size with less than 50% respiratory variability, suggesting right atrial pressure of 15 mmHg. IAS/Shunts: The interatrial septum was not well visualized.  LEFT VENTRICLE PLAX 2D LVIDd:         4.20 cm     Diastology LVIDs:         2.60 cm     LV e' medial:    6.46 cm/s LV PW:         0.80 cm     LV E/e' medial:  17.3 LV IVS:        0.70 cm     LV e' lateral:   8.25 cm/s LVOT diam:     1.90 cm     LV E/e' lateral: 13.6 LV SV:         67 LV SV Index:   42 LVOT Area:     2.84 cm  LV Volumes (MOD) LV vol d, MOD A2C: 52.2 ml LV vol d, MOD A4C: 58.5 ml LV vol s, MOD A2C: 24.0 ml LV vol s, MOD A4C: 19.1 ml LV SV MOD A2C:     28.2 ml LV SV MOD A4C:     58.5 ml LV SV MOD BP:      33.7 ml RIGHT VENTRICLE RV S prime:     9.76 cm/s TAPSE (M-mode): 1.0 cm LEFT ATRIUM             Index        RIGHT ATRIUM           Index LA diam:        4.50 cm 2.81 cm/m   RA Area:     26.80 cm LA Vol (A2C):   87.2 ml 54.42 ml/m  RA Volume:   98.10 ml  61.23 ml/m LA Vol (A4C):   94.0 ml 58.67 ml/m LA Biplane Vol: 91.0 ml 56.80 ml/m  AORTIC VALVE LVOT Vmax:   119.00 cm/s LVOT Vmean:  80.000 cm/s LVOT VTI:    0.235 m  AORTA Ao Root diam: 3.20 cm Ao Asc diam:  3.50 cm MITRAL VALVE                TRICUSPID VALVE MV Area (PHT): 3.91 cm     TR Peak grad:   31.1 mmHg MV Decel Time: 194 msec     TR Vmax:        279.00 cm/s MV E velocity: 112.00 cm/s MV A velocity: 40.30 cm/s   SHUNTS MV E/A ratio:  2.78         Systemic VTI:  0.24 m  Systemic Diam: 1.90 cm Oswaldo Milian MD Electronically signed by Oswaldo Milian MD Signature Date/Time: 08/27/2021/5:23:14 PM    Final    CARDIAC CATHETERIZATION  Result Date: 08/27/2021   Prox RCA lesion is 20% stenosed.   Mid LM to Dist LM lesion is 20% stenosed.   Ost LAD lesion is 30% stenosed.   Mid Cx lesion is 20% stenosed.   Prox LAD lesion is 50% stenosed.   The left ventricular systolic function is normal.   LV end diastolic pressure is normal.    The left ventricular ejection fraction is 55-65% by visual estimate.   There is no mitral valve regurgitation. Mild non-obstructive CAD Normal LV systolic function Elevated troponin and chest pain is likely due to demand ischemic in the setting of rapid atrial fibrillation. Recommendation: Medical management of CAD.   CT CHEST ABDOMEN PELVIS W CONTRAST  Result Date: 08/25/2021 CLINICAL DATA:  History of recent renal ablation with tachycardia, initial encounter EXAM: CT CHEST, ABDOMEN, AND PELVIS WITH CONTRAST TECHNIQUE: Multidetector CT imaging of the chest, abdomen and pelvis was performed following the standard protocol during bolus administration of intravenous contrast. RADIATION DOSE REDUCTION: This exam was performed according to the departmental dose-optimization program which includes automated exposure control, adjustment of the mA and/or kV according to patient size and/or use of iterative reconstruction technique. CONTRAST:  60m OMNIPAQUE IOHEXOL 300 MG/ML  SOLN COMPARISON:  None Available. FINDINGS: CT CHEST FINDINGS Cardiovascular: Scattered atherosclerotic calcifications are noted. No aneurysmal dilatation or dissection is noted. No cardiac enlargement is seen. The pulmonary artery shows a normal branching pattern bilaterally. No filling defect to suggest pulmonary embolism is noted. Scattered coronary calcifications are noted. Mediastinum/Nodes: Thoracic inlet is within normal limits. No hilar or mediastinal adenopathy is noted. The esophagus is within normal limits. Lungs/Pleura: Lungs are well aerated bilaterally. No focal infiltrate or sizable effusion is noted. Minimal scarring is noted in the right base. Tiny less than 4 mm nodule is noted in the superior segment of the left lower lobe. No other nodules are noted. Musculoskeletal: Degenerative changes of the thoracic spine are noted. No rib abnormality is noted. CT ABDOMEN PELVIS FINDINGS Hepatobiliary: No focal liver abnormality is seen.  Status post cholecystectomy. No biliary dilatation. Pancreas: Postsurgical changes are noted consistent with prior Whipple. The residual pancreas is atrophic. Spleen: Scattered cysts are noted within the spleen. Adrenals/Urinary Tract: Adrenal glands are within normal limits. Kidneys demonstrate multiple hypodense lesions throughout both kidneys. A mildly enhancing solid lesion is noted in the medial aspect of the left lower pole. This has undergone recent ablation and coil embolization. No new focal abnormality is noted. No obstructive changes are seen. The bladder is decompressed. Stomach/Bowel: No obstructive or inflammatory changes of the colon are seen. Postsurgical changes are noted consistent with the prior Whipple procedure. The appendix is not well visualized and may have been surgically removed. No inflammatory changes are seen. Visualized small bowel is within normal limits aside from the previously described surgical change. Vascular/Lymphatic: Aortic atherosclerosis. No enlarged abdominal or pelvic lymph nodes. Reproductive: Status post hysterectomy. No adnexal masses. Other: No abdominal wall hernia or abnormality. No abdominopelvic ascites. Musculoskeletal: Degenerative changes of lumbar spine are noted. No acute bony abnormality is noted. IMPRESSION: CT of the chest: No evidence of pulmonary emboli. 3 mm left solid pulmonary nodule. No routine follow-up imaging is recommended per Fleischner Society Guidelines. These guidelines do not apply to immunocompromised patients and patients with cancer. Follow up in patients with significant comorbidities as clinically  warranted. For lung cancer screening, adhere to Lung-RADS guidelines. Reference: Radiology. 2017; 284(1):228-43. CT of the abdomen and pelvis: Changes consistent with prior Whipple procedure. Stable solid mass in the medial aspect of the left kidney. This has recently undergone ablation as well as coil embolization. No acute abnormality noted.  Electronically Signed   By: Inez Catalina M.D.   On: 08/25/2021 22:06   CT GUIDE TISSUE ABLATION  Result Date: 08/22/2021 INDICATION: History of renal cell carcinoma. Poor operative candidate. Patient presents today for image guided left-sided renal microwave ablation following percutaneous particle and coil embolization performed this morning. Please refer to formal consultation in epic EMR dated 06/04/2021 additional details. EXAM: CT-GUIDED PERCUTANEOUS THERMAL ABLATION OF RENAL LESION ANESTHESIA/SEDATION: General MEDICATIONS: None, antibiotics were administered during the preceding renal arteriogram and percutaneous embolization. CONTRAST:  None COMPARISONS: COMPARISONS CT abdomen and pelvis-05/03/2021 PROCEDURE: The procedure, risks, benefits, and alternatives were explained to the patient. Questions regarding the procedure were encouraged and answered. The patient understands and consents to the procedure. The patient was placed under general anesthesia. Initial unenhanced CT was performed in a prone position to localize the at least 3.5 x 3.1 cm partially exophytic lesion involving the inferior medial aspect of the left kidney (image 67, series 2). The CT gantry table position was marked and lesion was identified sonographically. The procedure was planned. The patient was prepped with chlorhexidine in usual sterile fashion. Two 22 gauge spinal needles were utilized for procedural purposes. Next, under a combination ultrasound and CT guidance, 2 15 gauge microwave ablation probes were positioned bracketing the mid aspect the central left-sided renal lesion. Multiple ultrasound images were saved procedural documentation purposes. Appropriate positioning was confirmed and a 1 minute burn was performed at 90 watts followed by a 4 minute burn at 65 watts with intra procedural imaging obtained at the 3 minute mark of the ablation. Both probes were removed and postprocedural imaging was obtained. Access sites were  anesthetized with 1% lidocaine with epinephrine and dressings were applied. The patient tolerated the procedure well without immediate postprocedural complication. COMPLICATIONS: None immediate. FINDINGS: Noncontrast preprocedural imaging demonstrates an approximally 3.5 x 3.1 cm partially exophytic lesion arising from the inferior medial aspect of the left kidney (image 67, series 2). Contrast is noted within the hypervascular lesion as well as within the adjacent inferior pole of the left kidney following preceding particle and coil embolization. The lesion was identified sonographically and utilizing a combination of CT and ultrasound guidance, 2 probes were placed bracketing the central aspect of the lesion (representative image 17, series 12). The microwave ablation site appears to encompass the entirety of the partially exophytic left-sided renal lesion (series 13). Postprocedural imaging demonstrates expected air about the ablation site without postprocedural complication. Specifically, no pneumothorax or significant hemorrhage about the ablation site. IMPRESSION: Technically successful ultrasound and CT-guided thermal ablation of partially exophytic left-sided renal lesion. PLAN: - The patient will be admitted overnight for continued observation and PCA usage. - The patient will have a telemedicine consultation approximally 4 weeks following today's procedure with initial postprocedural imaging obtained in 3 months (early October 2023). Electronically Signed   By: Sandi Mariscal M.D.   On: 08/22/2021 14:29   IR EMBO TUMOR ORGAN ISCHEMIA INFARCT INC GUIDE ROADMAPPING  Result Date: 08/22/2021 INDICATION: Left-sided renal cell carcinoma. Poor operative candidate. Patient presents today for left-sided renal arteriogram and percutaneous embolization prior to image guided ablation. Please refer to consultation epic EMR dated 06/04/2021 for additional details. EXAM: 1. ULTRASOUND GUIDANCE  FOR ARTERIAL ACCESS 2.  LEFT RENAL ARTERIOGRAM 3. SUB SELECTIVE ARTERIOGRAM OF THE INFERIOR MEDIAL DIVISION OF THE LEFT RENAL ARTERY AND PERCUTANEOUS PARTICLE AND COIL EMBOLIZATION. 4. SUB SELECTIVE ARTERIOGRAM OF THE INFEROLATERAL DIVISION OF THE LEFT RENAL ARTERY AND PERCUTANEOUS PARTICLE AND COIL EMBOLIZATION MEDICATIONS: Cefoxitin 2 gm IV. The antibiotic was administered within one hour of the procedure ANESTHESIA/SEDATION: Moderate (conscious) sedation was employed during this procedure as administered by the Interventional Radiology RN. A total of Versed 0.5 mg and Fentanyl 50 mcg was administered intravenously. Moderate Sedation Time: 56 minutes. The patient's level of consciousness and vital signs were monitored continuously by radiology nursing throughout the procedure under my direct supervision. CONTRAST:  120 cc Omnipaque 300 FLUOROSCOPY TIME:  782 mGy COMPLICATIONS: None immediate. PROCEDURE: Informed consent was obtained from the patient following explanation of the procedure, risks, benefits and alternatives. The patient understands, agrees and consents for the procedure. All questions were addressed. A time out was performed prior to the initiation of the procedure. Maximal barrier sterile technique utilized including caps, mask, sterile gowns, sterile gloves, large sterile drape, hand hygiene, and Betadine prep. The right femoral head was marked fluoroscopically. Under sterile conditions and local anesthesia, the right common femoral artery access was performed with a micropuncture needle. Under direct ultrasound guidance, the right common femoral was accessed with a micropuncture kit. An ultrasound image was saved for documentation purposes. This allowed for placement of a 5-French vascular sheath. A limited arteriogram was performed through the side arm of the sheath confirming appropriate access within the right common femoral artery. The 5-French Mickelson catheter was advanced to the level of the thoracic aorta where  it was performed, back bled and flushed. The Mickelson catheter was then utilized to select the left renal artery and a left renal arteriogram was performed. Next, with the use of a fathom 14 microwire, a cantata microcatheter was utilized to select the inferior division the left renal artery and a sub selective arteriogram was performed The microcatheter was then utilized to select the inferolateral division of the left renal artery and a sub selective arteriogram was performed. Next, the vessel was embolized with approximally 1/5 vial of 100-300 micron Embospheres. Once vessel stasis was achieved, the vessel was embolized with overlapping 4 mm and 2 mm interlock coils. The microcatheter was partially retracted and a post embolization arteriogram was performed. The microcatheter was then utilized to select the inferior medial division of the left renal artery and a sub selective arteriogram was performed. Next, the vessel was embolized with approximally 1/5 vial of 103 100 micron Embospheres. Again, once vessel stasis was achieved, the vessel was embolized with overlapping 4 mm and 2 mm interlock coils. The microcatheter was partially retracted and a post embolization arteriogram was performed. The microcatheter was then removed and a completion arteriogram was performed via the [facility] catheter. Images reviewed and the procedure was terminated. All wires, catheters and sheaths were removed the patient. Hemostasis was achieved at the right groin access site with deployment of a Celt closure device. Postprocedural spot fluoroscopic image demonstrates appropriate positioning of the puncture location at the level of the humeral head. A dressing was applied. The patient tolerated the procedure well without immediate post procedural complication. FINDINGS: Selective right renal arteriogram demonstrates a fairly well-defined partially exophytic hypervascular lesion arising from the inferior pole of the left kidney  supplied by both the inferolateral and inferior medial divisions of the left renal artery, as was suggested on preceding CTA. Following percutaneous particle  and coil embolization of both the inferolateral and inferior medial divisions of the left renal artery, completion arteriogram demonstrates no residual flow to the hypervascular renal mass with preserved arterial supply to the remainder of the renal parenchyma. IMPRESSION: Technically successful percutaneous particle and coil embolization of both the inferomedial and inferolateral divisions of the left renal artery supplying the known partially exophytic hypervascular left-sided RCC. PLAN: Patient was then escorted to CT for image guided microwave ablation of the left-sided renal mass. Electronically Signed   By: Sandi Mariscal M.D.   On: 08/22/2021 11:22   IR US Guide Vasc Access Right  Result Date: 08/22/2021 INDICATION: Left-sided renal cell carcinoma. Poor operative candidate. Patient presents today for left-sided renal arteriogram and percutaneous embolization prior to image guided ablation. Please refer to consultation epic EMR dated 06/04/2021 for additional details. EXAM: 1. ULTRASOUND GUIDANCE FOR ARTERIAL ACCESS 2. LEFT RENAL ARTERIOGRAM 3. SUB SELECTIVE ARTERIOGRAM OF THE INFERIOR MEDIAL DIVISION OF THE LEFT RENAL ARTERY AND PERCUTANEOUS PARTICLE AND COIL EMBOLIZATION. 4. SUB SELECTIVE ARTERIOGRAM OF THE INFEROLATERAL DIVISION OF THE LEFT RENAL ARTERY AND PERCUTANEOUS PARTICLE AND COIL EMBOLIZATION MEDICATIONS: Cefoxitin 2 gm IV. The antibiotic was administered within one hour of the procedure ANESTHESIA/SEDATION: Moderate (conscious) sedation was employed during this procedure as administered by the Interventional Radiology RN. A total of Versed 0.5 mg and Fentanyl 50 mcg was administered intravenously. Moderate Sedation Time: 56 minutes. The patient's level of consciousness and vital signs were monitored continuously by radiology nursing throughout  the procedure under my direct supervision. CONTRAST:  120 cc Omnipaque 300 FLUOROSCOPY TIME:  706 mGy COMPLICATIONS: None immediate. PROCEDURE: Informed consent was obtained from the patient following explanation of the procedure, risks, benefits and alternatives. The patient understands, agrees and consents for the procedure. All questions were addressed. A time out was performed prior to the initiation of the procedure. Maximal barrier sterile technique utilized including caps, mask, sterile gowns, sterile gloves, large sterile drape, hand hygiene, and Betadine prep. The right femoral head was marked fluoroscopically. Under sterile conditions and local anesthesia, the right common femoral artery access was performed with a micropuncture needle. Under direct ultrasound guidance, the right common femoral was accessed with a micropuncture kit. An ultrasound image was saved for documentation purposes. This allowed for placement of a 5-French vascular sheath. A limited arteriogram was performed through the side arm of the sheath confirming appropriate access within the right common femoral artery. The 5-French Mickelson catheter was advanced to the level of the thoracic aorta where it was performed, back bled and flushed. The Mickelson catheter was then utilized to select the left renal artery and a left renal arteriogram was performed. Next, with the use of a fathom 14 microwire, a cantata microcatheter was utilized to select the inferior division the left renal artery and a sub selective arteriogram was performed The microcatheter was then utilized to select the inferolateral division of the left renal artery and a sub selective arteriogram was performed. Next, the vessel was embolized with approximally 1/5 vial of 100-300 micron Embospheres. Once vessel stasis was achieved, the vessel was embolized with overlapping 4 mm and 2 mm interlock coils. The microcatheter was partially retracted and a post embolization  arteriogram was performed. The microcatheter was then utilized to select the inferior medial division of the left renal artery and a sub selective arteriogram was performed. Next, the vessel was embolized with approximally 1/5 vial of 103 100 micron Embospheres. Again, once vessel stasis was achieved, the vessel was embolized  with overlapping 4 mm and 2 mm interlock coils. The microcatheter was partially retracted and a post embolization arteriogram was performed. The microcatheter was then removed and a completion arteriogram was performed via the [facility] catheter. Images reviewed and the procedure was terminated. All wires, catheters and sheaths were removed the patient. Hemostasis was achieved at the right groin access site with deployment of a Celt closure device. Postprocedural spot fluoroscopic image demonstrates appropriate positioning of the puncture location at the level of the humeral head. A dressing was applied. The patient tolerated the procedure well without immediate post procedural complication. FINDINGS: Selective right renal arteriogram demonstrates a fairly well-defined partially exophytic hypervascular lesion arising from the inferior pole of the left kidney supplied by both the inferolateral and inferior medial divisions of the left renal artery, as was suggested on preceding CTA. Following percutaneous particle and coil embolization of both the inferolateral and inferior medial divisions of the left renal artery, completion arteriogram demonstrates no residual flow to the hypervascular renal mass with preserved arterial supply to the remainder of the renal parenchyma. IMPRESSION: Technically successful percutaneous particle and coil embolization of both the inferomedial and inferolateral divisions of the left renal artery supplying the known partially exophytic hypervascular left-sided RCC. PLAN: Patient was then escorted to CT for image guided microwave ablation of the left-sided renal mass.  Electronically Signed   By: Sandi Mariscal M.D.   On: 08/22/2021 11:22   IR Angiogram Renal Uni Selective  Result Date: 08/22/2021 INDICATION: Left-sided renal cell carcinoma. Poor operative candidate. Patient presents today for left-sided renal arteriogram and percutaneous embolization prior to image guided ablation. Please refer to consultation epic EMR dated 06/04/2021 for additional details. EXAM: 1. ULTRASOUND GUIDANCE FOR ARTERIAL ACCESS 2. LEFT RENAL ARTERIOGRAM 3. SUB SELECTIVE ARTERIOGRAM OF THE INFERIOR MEDIAL DIVISION OF THE LEFT RENAL ARTERY AND PERCUTANEOUS PARTICLE AND COIL EMBOLIZATION. 4. SUB SELECTIVE ARTERIOGRAM OF THE INFEROLATERAL DIVISION OF THE LEFT RENAL ARTERY AND PERCUTANEOUS PARTICLE AND COIL EMBOLIZATION MEDICATIONS: Cefoxitin 2 gm IV. The antibiotic was administered within one hour of the procedure ANESTHESIA/SEDATION: Moderate (conscious) sedation was employed during this procedure as administered by the Interventional Radiology RN. A total of Versed 0.5 mg and Fentanyl 50 mcg was administered intravenously. Moderate Sedation Time: 56 minutes. The patient's level of consciousness and vital signs were monitored continuously by radiology nursing throughout the procedure under my direct supervision. CONTRAST:  120 cc Omnipaque 300 FLUOROSCOPY TIME:  765 mGy COMPLICATIONS: None immediate. PROCEDURE: Informed consent was obtained from the patient following explanation of the procedure, risks, benefits and alternatives. The patient understands, agrees and consents for the procedure. All questions were addressed. A time out was performed prior to the initiation of the procedure. Maximal barrier sterile technique utilized including caps, mask, sterile gowns, sterile gloves, large sterile drape, hand hygiene, and Betadine prep. The right femoral head was marked fluoroscopically. Under sterile conditions and local anesthesia, the right common femoral artery access was performed with a  micropuncture needle. Under direct ultrasound guidance, the right common femoral was accessed with a micropuncture kit. An ultrasound image was saved for documentation purposes. This allowed for placement of a 5-French vascular sheath. A limited arteriogram was performed through the side arm of the sheath confirming appropriate access within the right common femoral artery. The 5-French Mickelson catheter was advanced to the level of the thoracic aorta where it was performed, back bled and flushed. The Mickelson catheter was then utilized to select the left renal artery and a left renal  arteriogram was performed. Next, with the use of a fathom 14 microwire, a cantata microcatheter was utilized to select the inferior division the left renal artery and a sub selective arteriogram was performed The microcatheter was then utilized to select the inferolateral division of the left renal artery and a sub selective arteriogram was performed. Next, the vessel was embolized with approximally 1/5 vial of 100-300 micron Embospheres. Once vessel stasis was achieved, the vessel was embolized with overlapping 4 mm and 2 mm interlock coils. The microcatheter was partially retracted and a post embolization arteriogram was performed. The microcatheter was then utilized to select the inferior medial division of the left renal artery and a sub selective arteriogram was performed. Next, the vessel was embolized with approximally 1/5 vial of 103 100 micron Embospheres. Again, once vessel stasis was achieved, the vessel was embolized with overlapping 4 mm and 2 mm interlock coils. The microcatheter was partially retracted and a post embolization arteriogram was performed. The microcatheter was then removed and a completion arteriogram was performed via the [facility] catheter. Images reviewed and the procedure was terminated. All wires, catheters and sheaths were removed the patient. Hemostasis was achieved at the right groin access site  with deployment of a Celt closure device. Postprocedural spot fluoroscopic image demonstrates appropriate positioning of the puncture location at the level of the humeral head. A dressing was applied. The patient tolerated the procedure well without immediate post procedural complication. FINDINGS: Selective right renal arteriogram demonstrates a fairly well-defined partially exophytic hypervascular lesion arising from the inferior pole of the left kidney supplied by both the inferolateral and inferior medial divisions of the left renal artery, as was suggested on preceding CTA. Following percutaneous particle and coil embolization of both the inferolateral and inferior medial divisions of the left renal artery, completion arteriogram demonstrates no residual flow to the hypervascular renal mass with preserved arterial supply to the remainder of the renal parenchyma. IMPRESSION: Technically successful percutaneous particle and coil embolization of both the inferomedial and inferolateral divisions of the left renal artery supplying the known partially exophytic hypervascular left-sided RCC. PLAN: Patient was then escorted to CT for image guided microwave ablation of the left-sided renal mass. Electronically Signed   By: Sandi Mariscal M.D.   On: 08/22/2021 11:22    Microbiology: Results for orders placed or performed during the hospital encounter of 09/05/21  Blood Culture (routine x 2)     Status: None (Preliminary result)   Collection Time: 09/05/21  9:00 PM   Specimen: BLOOD RIGHT WRIST  Result Value Ref Range Status   Specimen Description BLOOD RIGHT WRIST  Final   Special Requests   Final    BOTTLES DRAWN AEROBIC AND ANAEROBIC Blood Culture results may not be optimal due to an inadequate volume of blood received in culture bottles   Culture   Final    NO GROWTH 3 DAYS Performed at [facility], [place] [address] [place], [place] [address]    Report Status PENDING  Incomplete  Urine Culture      Status: Abnormal   Collection Time: 09/05/21  9:00 PM   Specimen: In/Out Cath Urine  Result Value Ref Range Status   Specimen Description IN/OUT CATH URINE  Final   Special Requests   Final    NONE Performed at [facility], [place] [address] [place], [place] [address]    Culture MULTIPLE SPECIES PRESENT, SUGGEST RECOLLECTION (A)  Final   Report Status 09/07/2021 FINAL  Final  Blood  Culture (routine x 2)     Status: None (Preliminary result)   Collection Time: 09/05/21  9:15 PM   Specimen: BLOOD  Result Value Ref Range Status   Specimen Description BLOOD RIGHT ANTECUBITAL  Final   Special Requests   Final    BOTTLES DRAWN AEROBIC AND ANAEROBIC Blood Culture adequate volume   Culture   Final    NO GROWTH 3 DAYS Performed at [facility], [address] [place], [place] [address]    Report Status PENDING  Incomplete    Labs: CBC: Recent Labs  Lab 09/05/21 1837 09/07/21 0521  WBC 18.1* 15.7*  HGB 8.8* 8.1*  HCT 29.0* 26.2*  MCV 92.9 91.6  PLT 586* 430*   Basic Metabolic Panel: Recent Labs  Lab 09/05/21 1837 09/07/21 0521  NA 137 135  K 3.4* 3.7  CL 96* 98  CO2 29 27  GLUCOSE 270* 138*  BUN 16 31*  CREATININE 2.67* 4.39*  CALCIUM 8.3* 8.7*  PHOS  --  3.7   Liver Function Tests: Recent Labs  Lab 09/05/21 1837 09/07/21 0521  AST 18  --   ALT 19  --   ALKPHOS 83  --   BILITOT 0.3  --   PROT 5.8*  --   ALBUMIN 2.3* 2.1*   CBG: Recent Labs  Lab 09/07/21 0538 09/07/21 0731 09/07/21 1720 09/07/21 2155 09/08/21 0758  GLUCAP 144* 131* 129* 182* 146*    Discharge time spent: 35 minutes.  Signed: Cordelia Poche, MD [facility] 09/08/2021

## 2021-09-08 NOTE — Progress Notes (Signed)
Progress Note  Patient Name: Kim Burns Date of Encounter: 09/08/2021  [facility] Cardiologist: Peter Martinique, MD   Subjective   NAEO. In sinus rhythm this AM.  Inpatient Medications    Scheduled Meds:  amiodarone  200 mg Oral BID   apixaban  2.5 mg Oral BID   calcitRIOL  1 mcg Oral Q M,W,F-HD   colestipol  1 g Oral BID   diclofenac Sodium  4 g Topical QID   escitalopram  20 mg Oral QHS   folic acid  1 mg Oral Daily   insulin aspart  0-6 Units Subcutaneous TID WC   insulin aspart  10 Units Subcutaneous TID WC   lipase/protease/amylase  72,000 Units Oral TID with meals   midodrine  10 mg Oral Daily   multivitamin with minerals  1 tablet Oral QHS   sevelamer carbonate  800 mg Oral TID WC   sodium chloride flush  3 mL Intravenous Q12H   sodium chloride flush  3 mL Intravenous Q12H   thiamine  100 mg Oral Daily   Continuous Infusions:  sodium chloride     PRN Meds: sodium chloride, acetaminophen **OR** acetaminophen, alteplase, heparin, levalbuterol, lidocaine (PF), lidocaine-prilocaine, lipase/protease/amylase, melatonin, oxyCODONE, pentafluoroprop-tetrafluoroeth, polyethylene glycol, sodium chloride flush   Vital Signs    Vitals:   09/08/21 0024 09/08/21 0456 09/08/21 0750 09/08/21 0811  BP: 118/62 119/65 130/61 131/69  Pulse: 71 71 79 73  Resp: '16 16 16 16  '$ Temp: 98.8 F (37.1 C) 98.3 F (36.8 C) 98 F (36.7 C)   TempSrc: Oral Oral Oral   SpO2: 97% 98% 92% 100%  Weight:  58.1 kg    Height:        Intake/Output Summary (Last 24 hours) at 09/08/2021 1050 Last data filed at 09/07/2021 1540 Gross per 24 hour  Intake --  Output 1500 ml  Net -1500 ml      09/08/2021    4:56 AM 09/07/2021    3:40 PM 09/07/2021   12:02 PM  Last 3 Weights  Weight (lbs) 128 lb 1.4 oz 126 lb 12.2 oz 130 lb 11.7 oz  Weight (kg) 58.1 kg 57.5 kg 59.3 kg      Telemetry    Sinus rhythm - Personally Reviewed  ECG    Personally Reviewed  Physical Exam   GEN: No  acute distress.   Neck: No JVD Cardiac: RRR, no murmurs, rubs, or gallops.  Respiratory: Clear to auscultation bilaterally. GI: Soft, nontender, non-distended  MS: No edema; No deformity. Neuro:  Nonfocal  Psych: Normal affect   Labs    High Sensitivity Troponin:   Recent Labs  Lab 08/25/21 1917 08/25/21 2118 09/05/21 1837 09/05/21 2030  TROPONINIHS 1,098* 925* 32* 32*     Chemistry Recent Labs  Lab 09/05/21 1837 09/07/21 0521  NA 137 135  K 3.4* 3.7  CL 96* 98  CO2 29 27  GLUCOSE 270* 138*  BUN 16 31*  CREATININE 2.67* 4.39*  CALCIUM 8.3* 8.7*  PROT 5.8*  --   ALBUMIN 2.3* 2.1*  AST 18  --   ALT 19  --   ALKPHOS 83  --   BILITOT 0.3  --   GFRNONAA 17* 9*  ANIONGAP 12 10    Lipids No results for input(s): "CHOL", "TRIG", "HDL", "LABVLDL", "LDLCALC", "CHOLHDL" in the last 168 hours.  Hematology Recent Labs  Lab 09/05/21 1837 09/06/21 1132 09/07/21 0521  WBC 18.1*  --  15.7*  RBC 3.12* 2.91* 2.86*  HGB 8.8*  --  8.1*  HCT 29.0*  --  26.2*  MCV 92.9  --  91.6  MCH 28.2  --  28.3  MCHC 30.3  --  30.9  RDW 16.0*  --  16.3*  PLT 586*  --  503*   Thyroid No results for input(s): "TSH", "FREET4" in the last 168 hours.  BNP Recent Labs  Lab 09/05/21 2118  BNP 2,213.9*    DDimer No results for input(s): "DDIMER" in the last 168 hours.   Radiology    No results found.    Assessment & Plan    [name] is an 86yo woman with ESRD on iHD, AF on [place], RCC. Cardiology consulted 09/07/2021 for AF w/ RVR. She was started on amiodarone and has converted back to sinus rhythm.  #AF - cont amiodarone '200mg'$  PO BID x 7 days followed by '200mg'$  daily thereafter. - cont eliquis - outpatient follow up.  Cardiology will sign off. Please call with any questions/concerns.   For questions or updates, please contact [place] Please consult [email or web address] for contact info under        Signed, Vickie Epley, MD  09/08/2021, 10:50 AM

## 2021-09-10 ENCOUNTER — Other Ambulatory Visit: Payer: Medicare PPO

## 2021-09-10 ENCOUNTER — Telehealth: Payer: Self-pay | Admitting: Cardiology

## 2021-09-10 DIAGNOSIS — N186 End stage renal disease: Secondary | ICD-10-CM | POA: Diagnosis not present

## 2021-09-10 DIAGNOSIS — N2581 Secondary hyperparathyroidism of renal origin: Secondary | ICD-10-CM | POA: Diagnosis not present

## 2021-09-10 DIAGNOSIS — Z992 Dependence on renal dialysis: Secondary | ICD-10-CM | POA: Diagnosis not present

## 2021-09-10 LAB — CULTURE, BLOOD (ROUTINE X 2)
Culture: NO GROWTH
Culture: NO GROWTH
Special Requests: ADEQUATE

## 2021-09-10 NOTE — Patient Outreach (Signed)
[facility]) Care Management  09/10/2021  Kim Burns 02/24/1935 [id number]   3rd Attempt -no answer.  HIPAA compliant voice message left.    Plan: RN CM will attempt again within 3 weeks.    Jone Baseman, RN, MSN [facility] Care Management Care Management Coordinator Direct Line [phone] Toll Free: [phone]  Fax: [phone]

## 2021-09-10 NOTE — Telephone Encounter (Signed)
Patient got on the phone with me and gave me verbal permission to speak with her son-in-law Clare Gandy. Clare Gandy reports patient has afib rate of 110-150 and was sustained at rest before dropping to 110 after 30 minutes. Patient is "extremely weak"  no diaphoresis, sob, or chest pain. Has generalized weakness and lightheadedness. Was recently d/c from hospital for generalized weakness and afib.  While on phone, BP 104/60, P 116 while sitting. Recommended that patient be taken back to the ED for evaluation of cardiac rhythm. Ted verbalized understanding and frustration of need to go back to ED. He stated he hopes patient can be converted with medication.

## 2021-09-10 NOTE — Telephone Encounter (Signed)
  Patient c/o Palpitations:  High priority if patient c/o lightheadedness, shortness of breath, or chest pain  How long have you had palpitations/irregular HR/ Afib? Are you having the symptoms now? No   Are you currently experiencing lightheadedness, SOB or CP? Sob   Do you have a history of afib (atrial fibrillation) or irregular heart rhythm? Yes   Have you checked your BP or HR? (document readings if available): 80-140 HR  Are you experiencing any other symptoms?  Per pt's son in law said, Pt keeps getting afib every time she exert her self .

## 2021-09-10 NOTE — Telephone Encounter (Signed)
  He added pt is having afib and RVR

## 2021-09-11 DIAGNOSIS — D649 Anemia, unspecified: Secondary | ICD-10-CM | POA: Diagnosis not present

## 2021-09-11 DIAGNOSIS — N183 Chronic kidney disease, stage 3 unspecified: Secondary | ICD-10-CM | POA: Diagnosis not present

## 2021-09-11 DIAGNOSIS — C642 Malignant neoplasm of left kidney, except renal pelvis: Secondary | ICD-10-CM | POA: Diagnosis not present

## 2021-09-11 DIAGNOSIS — I509 Heart failure, unspecified: Secondary | ICD-10-CM | POA: Diagnosis not present

## 2021-09-11 DIAGNOSIS — N2889 Other specified disorders of kidney and ureter: Secondary | ICD-10-CM | POA: Diagnosis not present

## 2021-09-11 DIAGNOSIS — I959 Hypotension, unspecified: Secondary | ICD-10-CM | POA: Diagnosis not present

## 2021-09-11 DIAGNOSIS — I639 Cerebral infarction, unspecified: Secondary | ICD-10-CM | POA: Diagnosis not present

## 2021-09-11 DIAGNOSIS — I4891 Unspecified atrial fibrillation: Secondary | ICD-10-CM | POA: Diagnosis not present

## 2021-09-12 DIAGNOSIS — Z992 Dependence on renal dialysis: Secondary | ICD-10-CM | POA: Diagnosis not present

## 2021-09-12 DIAGNOSIS — N186 End stage renal disease: Secondary | ICD-10-CM | POA: Diagnosis not present

## 2021-09-12 DIAGNOSIS — N2581 Secondary hyperparathyroidism of renal origin: Secondary | ICD-10-CM | POA: Diagnosis not present

## 2021-09-12 LAB — MULTIPLE MYELOMA PANEL, SERUM
Albumin SerPl Elph-Mcnc: 2.5 g/dL — ABNORMAL LOW (ref 2.9–4.4)
Albumin/Glob SerPl: 1 (ref 0.7–1.7)
Alpha 1: 0.4 g/dL (ref 0.0–0.4)
Alpha2 Glob SerPl Elph-Mcnc: 0.9 g/dL (ref 0.4–1.0)
B-Globulin SerPl Elph-Mcnc: 0.7 g/dL (ref 0.7–1.3)
Gamma Glob SerPl Elph-Mcnc: 0.7 g/dL (ref 0.4–1.8)
Globulin, Total: 2.7 g/dL (ref 2.2–3.9)
IgA: 207 mg/dL (ref 64–422)
IgG (Immunoglobin G), Serum: 823 mg/dL (ref 586–1602)
IgM (Immunoglobulin M), Srm: 33 mg/dL (ref 26–217)
Total Protein ELP: 5.2 g/dL — ABNORMAL LOW (ref 6.0–8.5)

## 2021-09-13 ENCOUNTER — Other Ambulatory Visit (HOSPITAL_BASED_OUTPATIENT_CLINIC_OR_DEPARTMENT_OTHER): Payer: Self-pay

## 2021-09-13 ENCOUNTER — Ambulatory Visit: Payer: Self-pay | Admitting: *Deleted

## 2021-09-13 ENCOUNTER — Telehealth: Payer: Self-pay

## 2021-09-13 ENCOUNTER — Telehealth (HOSPITAL_BASED_OUTPATIENT_CLINIC_OR_DEPARTMENT_OTHER): Payer: Self-pay

## 2021-09-13 DIAGNOSIS — I4891 Unspecified atrial fibrillation: Secondary | ICD-10-CM | POA: Diagnosis not present

## 2021-09-13 DIAGNOSIS — N186 End stage renal disease: Secondary | ICD-10-CM | POA: Diagnosis not present

## 2021-09-13 DIAGNOSIS — I10 Essential (primary) hypertension: Secondary | ICD-10-CM | POA: Diagnosis not present

## 2021-09-13 DIAGNOSIS — S50819A Abrasion of unspecified forearm, initial encounter: Secondary | ICD-10-CM | POA: Diagnosis not present

## 2021-09-13 NOTE — Telephone Encounter (Signed)
Dialysis patient- skin tear present and may be full thickness. Patient has had significant bleeding with bandage adhesive and family can not remove for fear of further skin injury.They are inquiring if [facility] can do wound care and has proper bandages.   Advised ED is appropriate- [facility] can do wound care- but if patient requires repair/surgical procedure- she may be transferred. They understand Reason for Disposition . General information question, no triage required and triager able to answer question  Protocols used: Information Only Call - No Triage-A-AH

## 2021-09-13 NOTE — Telephone Encounter (Signed)
Transitions of Care Pharmacy   Call attempted for a pharmacy transitions of care follow-up. HIPAA appropriate voicemail was left with call back information provided.   Call attempt #1. Will follow-up in 2-3 days.   Darcus Austin, [facility] 09/13/2021 12:46 PM

## 2021-09-13 NOTE — Telephone Encounter (Signed)
Received call stating that pt had surgery a couple of weeks ago and was having some issues with her fistula. She sometimes requires longer hemostasis times and a pressure dressing. The HD center continues to use tape despite being asked not to and now pt has a significant skin tear.  Reviewed pt's chart, returned call, spoke with Clare Gandy, pt's son-in-law. Since he is not on the DPR, received verbal consent from pt to speak with him while she is on phone also. Two identifiers used. Fistula is working well, but when taking the tape off, it tore the pt's skin. It was again wrapped in tape. Pt bumped arm in the night and soaked through the bandage and was bleeding for approx 4 hours. Concerned that they can't get the bandage off and worries about it bleeding again, if they do. She denies any swelling or pain and can use her hand without issue. Spoke with [place], [place], who advised that pt can soak the bandage in water and let it ease off the area. They could also wait until HD tomorrow to have the dialysis center remove it, evaluate the site to see if treatment is feasible or if she needs different access. Clare Gandy stated that the skin tear is so deep the muscle is visible. Advised him that he may want to take her to the ED since the wound may be much more significant than previously stated. Confirmed understanding.

## 2021-09-13 NOTE — Progress Notes (Signed)
Notified by OP pharmacy that concurrent use Lexapro and Amiodarone increase risk for Qtc prolongation and pharmacy advised pt to stop Lexapro. Agree with plan as rec by pharmacist. Pt updated by our support staff and verbalizes understanding of above plan and rationale for same. Sge has cardiology FU scheduled for 8/2. Per IP cardiology notes miodarone duration expected to be ST. Pt also needs to call PCP/Dr Ernie Hew to arrange post dc FU.

## 2021-09-14 DIAGNOSIS — N2581 Secondary hyperparathyroidism of renal origin: Secondary | ICD-10-CM | POA: Diagnosis not present

## 2021-09-14 DIAGNOSIS — N186 End stage renal disease: Secondary | ICD-10-CM | POA: Diagnosis not present

## 2021-09-14 DIAGNOSIS — Z992 Dependence on renal dialysis: Secondary | ICD-10-CM | POA: Diagnosis not present

## 2021-09-17 DIAGNOSIS — I158 Other secondary hypertension: Secondary | ICD-10-CM | POA: Diagnosis not present

## 2021-09-17 DIAGNOSIS — N186 End stage renal disease: Secondary | ICD-10-CM | POA: Diagnosis not present

## 2021-09-17 DIAGNOSIS — D649 Anemia, unspecified: Secondary | ICD-10-CM | POA: Diagnosis not present

## 2021-09-17 DIAGNOSIS — N2581 Secondary hyperparathyroidism of renal origin: Secondary | ICD-10-CM | POA: Diagnosis not present

## 2021-09-17 DIAGNOSIS — Z992 Dependence on renal dialysis: Secondary | ICD-10-CM | POA: Diagnosis not present

## 2021-09-17 DIAGNOSIS — I959 Hypotension, unspecified: Secondary | ICD-10-CM | POA: Diagnosis not present

## 2021-09-18 NOTE — Progress Notes (Incomplete)
Cardiology Office Note:    Date:  09/19/2021   ID:  Kim Burns, DOB Jan 29, 1936, MRN [id number]  PCP:  [name], MD   [place] [facility] Cardiologist:  Peter Martinique, MD     Referring MD: [name], MD   CC: Recent orthopnea, Hospital follow-up, Anemia, and A-fib  History of Present Illness:    Kim Burns is a 86 y.o. female with a hx of the following:  A-fib - diagnosed early July 2023 CAD, (NSTEMI -elevated troponins and demand ischemia on 08/26/21 d/t setting of A-fib RVR; LHC revealed mild,non-obstructive CAD) Pulmonary nodule GERD T2DM Hx of GI bleed ESRD on HD; Left Kidney CA with recent ablation Hx of stroke Symptomatic Anemia Orthostatic hypotension Hypokalemia  She was last seen by Dr. Martinique on August 02, 2021 at the request of her PCP.  Echocardiogram in 2015 showed left ventricular hypertrophy with normal systolic function.  She developed end-stage renal disease and has been on dialysis since January 2023.  During dialysis she developed orthostatic hypotension, her antihypertensives were discontinued, and she was on midodrine on her dialysis days.  It was found that she had a slow-growing renal cancer on her left kidney and was planning on having microwave treatment for this.  It was found that she did tend to bleed quite a bit from her vascular access site with dialysis.  Denied any syncope, shortness of breath, or chest pain.  Repeat 2D echo revealed normal LVEF, no significant valvular disease, no evidence of amyloid, and Dr. Martinique stated that she could proceed with planned carpal tunnel surgery.  She had been diagnosed with new onset atrial fibrillation with RVR during hospitalization in early July 2023 after an ablation procedure for left renal cell carcinoma. She was started on rate control agents as well as Eliquis for stroke prophylaxis.  She had spontaneously converted back to sinus rhythm prior to discharge after receiving IV  Cardizem. CTA of abdomen and chest did not show any evidence of PE, 3 mm left pulmonary nodule noted, stable solid mass in the medial aspect of the left kidney.  Diagnostic cardiac catheterization on 7/10 due to elevated troponins on 7/8 revealed nonobstructive disease with 20% proximal RCA, 20% left circumflex artery, 20% mid left main, 30% ostial LAD, 50% proximal LAD, EF 55 to 65%.  Repeat echo obtained on the same day revealed LVEF of 60 to 65%, moderately reduced RV systolic function, grade 3 diastolic dysfunction, RSVP 46.1 mmHg, severe biatrial enlargement, mild MR, mild to moderate TR.  She was treated with 12.5 mg daily of Toprol-XL and 2.5 mg twice daily of Eliquis.  Prior to this discharge she underwent left basilic vein angioplasty in the mid upper arm due to malfunctioning AV fistula, this left arm basilic vein fistula had 80% stenosis, therefore she was treated with balloon angioplasty.  She was admitted to the hospital on September 05, 2021 for chief complaint of weakness on 7/19, felt like her systolic blood pressure was lower than 100 mmHg.  Stated she felt weak and felt like she was going to faint when she got up to go to dialysis on Wednesday and was having a hard time getting out of bed. She was able to complete dialysis that day. After returning home, family noticed that her heart rate remained 118 bpm.  Family member brought her to [facility] for further evaluation and it was noted that she was back in A-fib with RVR.  Blood pressure was elevated  at 191/57, CT of abdomen and pelvis did not show any source of infection given the white blood cell count was elevated at 18,000.  EKG revealed A-fib with RVR, heart rate was 106 bpm.  Chest x-ray did not reveal any acute findings.  Hemoglobin was 8.8.  She admitted to low grade fever, Urinalysis showed rare bacteria and she was treated with antibiotic.  BNP was elevated at 2213.  Cardiology was consulted and metoprolol was discontinued due to  history of low blood pressures with hemodialysis.  She was started on amiodarone and she converted back to sinus rhythm the day before discharge.  Midodrine was continued for orthostatic hypotension.  Leukocytosis was improved slightly with no identified infectious source during hospital stay.  Was recommended to repeat CBC as an outpatient.   Today she presents for hospital follow-up with her son. She states she visited her primary care doctor this week, and hemoglobin was 8.2, and wants to know if she needs to go to the ED for a blood transfusion.  Previous hemoglobins have been ranging from 7.9-8.8 in the month of July.  At her PCPs office, she was started on carvedilol 3.125 mg daily in addition to amiodarone 200 mg daily for her atrial fibrillation.  Patient and son want clarification about these medications.  Chief complaint today is recent orthopnea and wants to know any additional testing that needs to take place to check for toxicity r/t amiodarone. She states for the last 2 days she has had to add a pillow behind her back (2 total pillows), says she cannot breathe while lying flat and this is new for her.  Denies any hemoptysis, melena, hematuria, and any acute bleeding while on Eliquis.  Patient denies any chest pain, worsening shortness of breath, still has some DOE. Notices palpitations at night occasionally while lying down to sleep. Son believes she has been going in and out of A-fib. Also denies syncope, presyncope, dizziness, PND, swelling, weight changes, bleeding, or claudication. Denies any other questions or concerns.     Past Medical History:  Diagnosis Date   Anemia of chronic disease    takes iron   Anxiety    Blood transfusion without reported diagnosis    Bradycardia    Diabetes mellitus without complication ([place])    became diabetic after Whipple procedure   Diarrhea    Dysrhythmia 04/16/2016   bradycardia due to medication    ESRD on hemodialysis [facility])    M-W-F   GERD  (gastroesophageal reflux disease)    Gout    Headache    History of kidney stones 06/2013   Hyperparathyroidism ([place])    Hypertension    PONV (postoperative nausea and vomiting)    Sleep apnea    no cpap machine. could not tolerate   Stroke ([place]) 04/16/2016   TIA 1995   Vitamin D deficiency     Past Surgical History:  Procedure Laterality Date   A/V FISTULAGRAM Left 08/30/2021   Procedure: A/V Fistulagram;  Surgeon: Marty Heck, MD;  Location: [place] [facility];  Service: Cardiovascular;  Laterality: Left;   ABDOMINAL HYSTERECTOMY  1985   complete   BACK SURGERY  7096   lower   BASCILIC VEIN TRANSPOSITION Left 04/24/2017   Procedure: LEFT ARM FIRST STAGE BASILIC VEIN TRANSPOSITION;  Surgeon: Serafina Mitchell, MD;  Location: [place];  Service: Vascular;  Laterality: Left;   [place] Left 07/10/2017   Procedure: SECOND STAGE BASILIC VEIN TRANSPOSITION LEFT ARM;  Surgeon:  Serafina Mitchell, MD;  Location: [facility];  Service: Vascular;  Laterality: Left;   BREAST LUMPECTOMY Left x 2   many years apart, benign   CHOLECYSTECTOMY  1985   CYSTOSCOPY WITH URETEROSCOPY AND STENT PLACEMENT Left 06/18/2013   Procedure: CYSTOSCOPY WITH Lef URETEROSCOPY AND Left STENT PLACEMENT;  Surgeon: Dutch Gray, MD;  Location: [facility];  Service: Urology;  Laterality: Left;   ESOPHAGOGASTRODUODENOSCOPY (EGD) WITH PROPOFOL N/A 11/13/2016   Procedure: ESOPHAGOGASTRODUODENOSCOPY (EGD) WITH PROPOFOL;  Surgeon: Otis Brace, MD;  Location: [place];  Service: Gastroenterology;  Laterality: N/A;   EUS N/A 02/07/2016   Procedure: ESOPHAGEAL ENDOSCOPIC ULTRASOUND (EUS) RADIAL;  Surgeon: Arta Silence, MD;  [facility];  Service: Endoscopy;  Laterality: N/A;   EYE SURGERY Bilateral 2014   ioc for catracts    FOOT SURGERY Left 1990   something with toes unsure what    HERNIA REPAIR  2018   IR EMBO TUMOR ORGAN ISCHEMIA INFARCT INC GUIDE ROADMAPPING  08/22/2021   IR  RADIOLOGIST EVAL & MGMT  05/18/2021   IR RENAL SUPRASEL UNI S&I MOD SED  08/22/2021   IR US GUIDE VASC ACCESS RIGHT  08/22/2021   LEFT HEART CATH AND CORONARY ANGIOGRAPHY N/A 08/27/2021   Procedure: LEFT HEART CATH AND CORONARY ANGIOGRAPHY;  Surgeon: Burnell Blanks, MD;  Location: [place] [facility];  Service: Cardiovascular;  Laterality: N/A;   PARATHYROID EXPLORATION     PERIPHERAL VASCULAR BALLOON ANGIOPLASTY Left 08/30/2021   Procedure: PERIPHERAL VASCULAR BALLOON ANGIOPLASTY;  Surgeon: Marty Heck, MD;  Location: [place] [facility];  Service: Cardiovascular;  Laterality: Left;  arm fistula   RADIOLOGY WITH ANESTHESIA Left 08/22/2021   Procedure: MICROWAVE ABLATION;  Surgeon: Sandi Mariscal, MD;  Location: [facility];  Service: Anesthesiology;  Laterality: Left;   WHIPPLE PROCEDURE N/A 04/23/2016   Procedure: WHIPPLE PROCEDURE;  Surgeon: Stark Klein, MD;  Location: [place];  Service: General;  Laterality: N/A;    Current Medications: Current Meds  Medication Sig   acetaminophen (TYLENOL 8 HOUR ARTHRITIS PAIN) 650 MG CR tablet Take 1,300 mg by mouth 2 (two) times daily as needed for pain.   amiodarone (PACERONE) 200 MG tablet Take 1 tablet (200 mg total) by mouth 2 (two) times daily for 6 days, THEN 1 tablet (200 mg total) daily.   apixaban (ELIQUIS) 2.5 MG TABS tablet Take 1 tablet (2.5 mg total) by mouth 2 (two) times daily.   Cholecalciferol (VITAMIN D-3 PO) Take 1 capsule by mouth every Monday, Wednesday, and Friday with hemodialysis.   colestipol (COLESTID) 1 g tablet Take 1 g by mouth See admin instructions. 1 tablet (1g) prior to dialysis M-W-F + 1 tablet daily PRN diarrhea   diclofenac sodium (VOLTAREN) 1 % GEL Apply 2 g topically 4 (four) times daily as needed (for hand pain).    diphenhydramine-acetaminophen (TYLENOL PM) 25-500 MG TABS tablet Take 1 tablet by mouth at bedtime as needed (sleep, pain).   insulin aspart (NOVOLOG FLEXPEN) 100 UNIT/ML FlexPen Inject 10 Units into  the skin 3 (three) times daily as needed (CBG > 200).   lidocaine-prilocaine (EMLA) cream Apply 1 Application topically every Monday, Wednesday, and Friday with hemodialysis.   lipase/protease/amylase (CREON) 36000 UNITS CPEP capsule Take 1 capsule (36,000 Units total) by mouth with snacks. (Patient taking differently: Take 36,000-72,000 Units by mouth See admin instructions. 2 capsules(72000 units) TID with meals AND 1 capsule(36000 units) with snacks)   sevelamer carbonate (RENVELA) 800 MG tablet Take 1,600 mg by  mouth 3 (three) times daily with meals.   [DISCONTINUED] carvedilol (COREG) 3.125 MG tablet Take 3.125 mg by mouth 2 (two) times daily.   [DISCONTINUED] escitalopram (LEXAPRO) 20 MG tablet Take 20 mg by mouth at bedtime.     Allergies:   Lopid [gemfibrozil], Lotemax [loteprednol etabonate], Statins, Norco [hydrocodone-acetaminophen], Other, Tomato, Vibra-tab [doxycycline], Codeine, and Hydrocodone-acetaminophen   Social History   Socioeconomic History   Marital status: Widowed    Spouse name: Not on file   Number of children: Not on file   Years of education: Not on file   Highest education level: Not on file  Occupational History   Not on file  Tobacco Use   Smoking status: Never   Smokeless tobacco: Never  Vaping Use   Vaping Use: Never used  Substance and Sexual Activity   Alcohol use: No   Drug use: No   Sexual activity: Not on file  Other Topics Concern   Not on file  Social History Narrative   Not on file   Social Determinants of Health   Financial Resource Strain: Not on file  Food Insecurity: Not on file  Transportation Needs: Not on file  Physical Activity: Not on file  Stress: Not on file  Social Connections: Not on file     Family History: The patient's family history includes Alzheimer's disease in her sister and sister; Cancer in her brother and father; Heart disease in her mother; Stroke in her mother. There is no history of Breast cancer.  ROS:    Review of Systems  Constitutional:  Positive for malaise/fatigue. Negative for chills, diaphoresis, fever and weight loss.  HENT:  Negative for congestion, ear discharge, ear pain, hearing loss, nosebleeds, sinus pain, sore throat and tinnitus.   Eyes:  Negative for blurred vision, double vision, photophobia, pain, discharge and redness.  Respiratory:  Positive for shortness of breath. Negative for cough, hemoptysis, sputum production, wheezing and stridor.        See HPI.  Cardiovascular:  Positive for palpitations and orthopnea. Negative for chest pain, claudication, leg swelling and PND.       See HPI.  Gastrointestinal:  Negative for abdominal pain, blood in stool, constipation, diarrhea, heartburn, melena, nausea and vomiting.  Genitourinary:  Negative for dysuria, flank pain, frequency, hematuria and urgency.  Musculoskeletal:  Negative for back pain, falls, joint pain, myalgias and neck pain.  Skin:  Negative for itching and rash.  Neurological:  Positive for weakness. Negative for dizziness, tingling, tremors, sensory change, speech change, focal weakness, seizures, loss of consciousness and headaches.  Endo/Heme/Allergies:  Negative for environmental allergies and polydipsia. Bruises/bleeds easily.  Psychiatric/Behavioral:  Negative for depression, hallucinations, memory loss, substance abuse and suicidal ideas. The patient is not nervous/anxious and does not have insomnia.    Please see the history of present illness.    All other systems reviewed and are negative.  EKGs/Labs/Other Studies Reviewed:    The following studies were reviewed today:   EKG:  EKG is ordered today.  The ekg ordered today demonstrates NSR, low voltage QRS, nonspecific T wave abnormality, prolonged QT interval (previous QT interval was at 523 ms on 12 lead EKG on 09/05/21), without any other acute changes.     CT Abdomen Pelvis WO Contrast on 09/06/21: 1. Status post renal mass ablation with embolization  with stable mass in the medial aspect of the left kidney. Evaluation is limited due to lack of IV contrast. Increased perinephric fat stranding on the left may represent  edema or blood products. New hyperdense lesions are noted in the left kidney suggesting hemorrhagic or proteinaceous cysts. 2. Small left pleural effusion with atelectasis at the lung bases. 3. Aortic atherosclerosis. 4. Remaining incidental findings as described above.  2 view Chest X-ray on 09/05/21: No active pulmonary disease   CT Angio HEAD NECK w/wo CM on 08/28/21: 1. Negative CTA of the head and neck for large vessel occlusion or other emergent finding. 2. Mild for age atheromatous disease about the aortic arch and carotid bifurcations without hemodynamically significant stenosis. 3. 3 mm left lower lobe nodule, indeterminate. No routine follow-up imaging is recommended per Fleischner Society Guidelines. These guidelines do not apply to immunocompromised patients and patients with cancer. Follow up in patients with significant comorbidities as clinically warranted. For lung cancer screening, adhere to Lung-RADS guidelines. Reference: Radiology. 2017; 284(1):228-43.     MRI Brain without Contrast on 08/28/21: IMPRESSION: 1. Scattered punctate foci of restricted diffusion as described. The finding is nonspecific but can be seen in the setting of acute/subacute ischemia, a demyelinating process such as multiple sclerosis, vasculitis, complicated migraine headaches, or as the sequelae of a prior infectious or inflammatory process. 2. Other remote lacunar infarcts of the cerebellum bilaterally. 3. Progressive degenerative changes of the upper cervical spine.   2D Echo on 08/27/2021: Right ventricular systolic function is moderately reduced.  The right ventricular size is mildly enlarged.  There is moderately elevated pulmonary artery systolic pressure.  The estimated right ventricular systolic pressure is 89.2  mmHg.  Normal apical function with free wall hypokinesis, consistent with McConnell sign as can be seen in acute PE.  Consider evaluation for PE. Left ventricular ejection fraction, by estimation is 60 to 65%.  The left ventricle has normal function.  The left ventricle has no regional wall motion abnormalities.  Left ventricular diastolic parameters are consistent with grade 3 diastolic dysfunction (restrictive).  Elevated left atrial pressure. Left atrial size was severely dilated. Right atrial size was severely dilated. The mitral valve is degenerative.  Mild mitral valve regurgitation.  No evidence of mitral stenosis.  Moderate mitral annular calcification. Tricuspid valve regurgitation is mild to moderate. The aortic valve is tricuspid.  Aortic valve regurgitation is not visualized.  Aortic valve sclerosis is present, with no evidence of aortic valve stenosis. The inferior vena cava is dilated in size with less than 50% respiratory variability, suggesting right atrial pressure of 15 mmHg.   Left heart cath and coronary angiography on August 27, 2021:   Prox RCA lesion is 20% stenosed.   Mid LM to Dist LM lesion is 20% stenosed.   Ost LAD lesion is 30% stenosed.   Mid Cx lesion is 20% stenosed.   Prox LAD lesion is 50% stenosed.   The left ventricular systolic function is normal.   LV end diastolic pressure is normal.   The left ventricular ejection fraction is 55-65% by visual estimate.   There is no mitral valve regurgitation.   Mild non-obstructive CAD Normal LV systolic function Elevated troponin and chest pain is likely due to demand ischemic in the setting of rapid atrial fibrillation.  Recommendation: Medical management of CAD.    CT Chest Abdomen Pelvis with contrast on 08/25/21: CT of the chest: No evidence of pulmonary emboli. 3 mm left solid pulmonary nodule. No routine follow-up imaging is recommended per Fleischner Society Guidelines. These guidelines do not apply to  immunocompromised patients and patients with cancer. Follow up in patients with significant comorbidities as clinically warranted. For  lung cancer screening, adhere to Lung-RADS guidelines. Reference: Radiology. 2017; 284(1):228-43. CT of the abdomen and pelvis: Changes consistent with prior Whipple procedure. Stable solid mass in the medial aspect of the left kidney. This has recently undergone ablation as well as coil embolization. No acute abnormality noted.    VAS [place] STEAL EXAM on 05/23/2021: Summary:  Right digital systolic pressure at rest is 141 mm Hg. Left digital  systolic pressure at rest is 121 mm Hg which increases to 137 mm Hg with manual compression of AVF. Resting right brachial systolic pressure is 725 mm Hg. Slight increase in left digital pressure (<20 mm Hg) with compression of  AVF.   Recent Labs: 09/05/2021: B Natriuretic Peptide 2,213.9 09/19/2021: ALT 42; BUN 16; Creatinine, Ser 2.59; Hemoglobin 7.8; Platelets 325; Potassium 3.8; Sodium 138  Recent Lipid Panel    Component Value Date/Time   CHOL 95 08/26/2021 0447   CHOL 133 08/09/2021 1143   TRIG 110 08/26/2021 0447   HDL 35 (L) 08/26/2021 0447   HDL 43 08/09/2021 1143   CHOLHDL 2.7 08/26/2021 0447   VLDL 22 08/26/2021 0447   LDLCALC 38 08/26/2021 0447   LDLCALC 68 08/09/2021 1143     Risk Assessment/Calculations:    CHA2DS2-VASc Score = 7   This indicates a 11.2% annual risk of stroke. The patient's score is based upon: CHF History: 0 HTN History: 0 Diabetes History: 1 Stroke History: 2 Vascular Disease History: 1 Age Score: 2 Gender Score: 1          Physical Exam:    VS:  BP (!) 152/62   Pulse 75   Ht '5\' 2"'$  (1.575 m)   Wt 126 lb 6.4 oz (57.3 kg)   SpO2 100%   BMI 23.12 kg/m     Wt Readings from Last 3 Encounters:  09/19/21 126 lb 6.4 oz (57.3 kg)  09/08/21 128 lb 1.4 oz (58.1 kg)  08/31/21 126 lb 12.8 oz (57.5 kg)     GEN: Frail, 86 y.o. female in no acute distress HEENT:  Normal NECK: No JVD; No carotid bruits CARDIAC: RRR, no murmurs, rubs, gallops; 2+ pulses throughout, strong and equal bilaterally; AV fistula wrapped with bandage along left upper arm, with dressing that is clean, dry, and intact.  RESPIRATORY:  Diminished with coarse lung sounds heard along left lower posterior base during ascultation, diminished and clear bilaterally, without rales, wheezing or rhonchi noted  ABDOMEN: Soft, non-tender, non-distended MUSCULOSKELETAL:  No edema; thoracic kyphosis noted, generalized weakness and walks with a slow gait with a rolling walker SKIN: General pallor, overall warm and dry NEUROLOGIC:  Alert and oriented x 3 PSYCHIATRIC:  Normal affect   ASSESSMENT:    1. Orthopnea   2. Paroxysmal atrial fibrillation (HCC)   3. Current use of long term anticoagulation   4. Medication management   5. Prolonged Q-T interval on ECG   6. ESRD (end stage renal disease) ([place])   7. Anemia due to chronic kidney disease, on chronic dialysis (HCC)    PLAN:    In order of problems listed above:  Orthopnea - acute, not progressing Stated that this started about a day or 2 ago and has never had this before.  While listening to her during auscultation she has overall diminished and breath sounds, however diminished and coarse breath sounds heard along left lower posterior lung base, concerning for possible left lower lobe pleural effusion. Will go ahead and order a 2 view chest x-ray to further evaluate.  Paroxysmal A-fib with current long term use of anticoagulation, medication management - chronic, stable  Twelve-lead EKG reveals normal sinus rhythm, 75 bpm.  Son states he believes she goes in and out of A-fib sometimes as heart rate recently went up to 120s.  Given her age and frailness, it is very possible that she is still having episodes of paroxysmal A-fib.  We will continue amiodarone 200 mg daily and I instructed patient and son that if she feels more palpitations  she may take an additional 200 mg dose of amiodarone as needed.  We will go ahead and d/c carvedilol as we do not want to drop her blood pressure and cause orthostatic hypotension as beta-blockers were stopped during her previous hospitalization for this reason. I educated patient and son regarding this and they verbalized understanding.  She is on appropriate dose of Eliquis given her age, kidney function, and her weight.  Continue Eliquis 2.5 mg twice daily.  We will go ahead and order liver function enzymes and TSH for her next follow-up visit. 2 view chest x-ray ASAP as mentioned above.   3. Prolonged QT interval - acute, stable Twelve-lead EKG today in the office revealed prolonged QTc interval at around 550 ms, her previous twelve-lead EKG in the hospital revealed a QTc interval at 523 ms on 09/07/21 and 523 ms on 09/05/21. This prolonged QTc interval was not addressed by cardiology during her hospitalization from 09/05/21-09/08/21. A progress note by Erin Hearing, NP-C on September 08, 2021 stated, "Notified by OP pharmacy that concurrent use of Lexapro and amiodarone increased risk of QTc prolongation and pharmacy advised pt to stop Lexapro.  Agree with plan as directed by pharmacist.  Patient updated by her support staff and verbalizes understanding of above plan and rationale for same."  It appears Lexapro was not removed from her medication list before previous hospital discharge on 09/08/2021. I have notified patient's care team (Spoke with Triage RN) at [facility] ED (where patient is currently located and receiving care). I gave a verbal order for Lexapro to be discontinued to ED Triage RN. He verbalized understanding of the verbal order. I have removed Lexapro off of our outpatient medication list and have included this information in the Patient Instructions.   4. ESRD and Anemia - chronic, stable Regarding if she needs blood transfusion, I discussed with patient and son that primary care normally  decides whether patient needs to go to hospital to receive blood transfusion.  Her hemoglobins during her previous hospitalizations have averaged around 7.9-9.2 for the month of July. Previous Hemoglobins from 02/2021 were 10.7 and 11.2.  I discussed that usually hospital protocol is if the hemoglobin drops to 7 or below, she will require a blood transfusion.  I told her that she needs to refer to her PCP regarding her hemoglobin as well as her dialysis team, she may need some erythropoietin administration to help raise her hemoglobin level.  She stated last time she got erythropoietin was in December of last year. Continue to follow with PCP.  5. Disposition: F/U in 1 month or sooner if anything changes    Medication Adjustments/Labs and Tests Ordered: Current medicines are reviewed at length with the patient today.  Concerns regarding medicines are outlined above.  Orders Placed This Encounter  Procedures   DG Chest 2 View   TSH   Hepatic function panel   EKG 12-Lead   No orders of the defined types were placed in this encounter.   Patient Instructions  Medication Instructions:  STOP Coreg. STOP Lexapro  *If you need a refill on your cardiac medications before your next appointment, please call your pharmacy*   Lab Work: Your physician recommends that you return for lab work in: 1 Month  TSH and HEPATIC FUNCTION PANEL.   If you have labs (blood work) drawn today and your tests are completely normal, you will receive your results only by: [place] (if you have MyChart) OR A paper copy in the mail If you have any lab test that is abnormal or we need to change your treatment, we will call you to review the results.   Testing/Procedures: DG Chest 2V in [facility].   Follow-Up: At [facility], you and your health needs are our priority.  As part of our continuing mission to provide you with exceptional heart care, we have created designated Provider Care Teams.   These Care Teams include your primary Cardiologist (physician) and Advanced Practice Providers (APPs -  Physician Assistants and Nurse Practitioners) who all work together to provide you with the care you need, when you need it.  We recommend signing up for the patient portal called "MyChart".  Sign up information is provided on this After Visit Summary.  MyChart is used to connect with patients for Virtual Visits (Telemedicine).  Patients are able to view lab/test results, encounter notes, upcoming appointments, etc.  Non-urgent messages can be sent to your provider as well.   To learn more about what you can do with MyChart, go to [email or web address].    Your next appointment:    Sept 06/2021 at 02:45 PM with APP Almyra Deforest   Other Instructions Please see your PCP for Severe anemia.  Important Information About Sugar         Signed, Finis Bud, NP  09/19/2021 11:07 PM    [place]

## 2021-09-19 ENCOUNTER — Ambulatory Visit: Payer: Medicare PPO | Admitting: Nurse Practitioner

## 2021-09-19 ENCOUNTER — Ambulatory Visit: Payer: Medicare PPO | Admitting: Physician Assistant

## 2021-09-19 ENCOUNTER — Encounter: Payer: Self-pay | Admitting: Physician Assistant

## 2021-09-19 ENCOUNTER — Ambulatory Visit
Admission: RE | Admit: 2021-09-19 | Discharge: 2021-09-19 | Disposition: A | Discharge status: Home / Self Care | Payer: Medicare PPO | Source: Ambulatory Visit | Attending: Nurse Practitioner | Admitting: Nurse Practitioner

## 2021-09-19 ENCOUNTER — Encounter (HOSPITAL_COMMUNITY): Payer: Self-pay | Admitting: Emergency Medicine

## 2021-09-19 ENCOUNTER — Emergency Department (HOSPITAL_COMMUNITY)
Admission: EM | Admit: 2021-09-19 | Discharge: 2021-09-20 | Disposition: A | Discharge status: Home / Self Care | Payer: Medicare PPO | Attending: Emergency Medicine | Admitting: Emergency Medicine

## 2021-09-19 ENCOUNTER — Emergency Department (HOSPITAL_COMMUNITY): Payer: Medicare PPO

## 2021-09-19 VITALS — BP 152/62 | HR 75 | Ht 62.0 in | Wt 126.4 lb

## 2021-09-19 DIAGNOSIS — I480 Paroxysmal atrial fibrillation: Secondary | ICD-10-CM

## 2021-09-19 DIAGNOSIS — Z794 Long term (current) use of insulin: Secondary | ICD-10-CM | POA: Diagnosis not present

## 2021-09-19 DIAGNOSIS — Z7901 Long term (current) use of anticoagulants: Secondary | ICD-10-CM | POA: Diagnosis not present

## 2021-09-19 DIAGNOSIS — N186 End stage renal disease: Secondary | ICD-10-CM

## 2021-09-19 DIAGNOSIS — I4891 Unspecified atrial fibrillation: Secondary | ICD-10-CM | POA: Diagnosis not present

## 2021-09-19 DIAGNOSIS — F411 Generalized anxiety disorder: Secondary | ICD-10-CM | POA: Diagnosis not present

## 2021-09-19 DIAGNOSIS — D631 Anemia in chronic kidney disease: Secondary | ICD-10-CM

## 2021-09-19 DIAGNOSIS — R0601 Orthopnea: Secondary | ICD-10-CM

## 2021-09-19 DIAGNOSIS — R0602 Shortness of breath: Secondary | ICD-10-CM | POA: Diagnosis not present

## 2021-09-19 DIAGNOSIS — Z79899 Other long term (current) drug therapy: Secondary | ICD-10-CM | POA: Insufficient documentation

## 2021-09-19 DIAGNOSIS — Z992 Dependence on renal dialysis: Secondary | ICD-10-CM

## 2021-09-19 DIAGNOSIS — R9431 Abnormal electrocardiogram [ECG] [EKG]: Secondary | ICD-10-CM

## 2021-09-19 DIAGNOSIS — N2581 Secondary hyperparathyroidism of renal origin: Secondary | ICD-10-CM | POA: Diagnosis not present

## 2021-09-19 DIAGNOSIS — I10 Essential (primary) hypertension: Secondary | ICD-10-CM | POA: Diagnosis not present

## 2021-09-19 DIAGNOSIS — I951 Orthostatic hypotension: Secondary | ICD-10-CM

## 2021-09-19 DIAGNOSIS — D649 Anemia, unspecified: Secondary | ICD-10-CM | POA: Insufficient documentation

## 2021-09-19 DIAGNOSIS — I700 Atherosclerosis of aorta: Secondary | ICD-10-CM | POA: Diagnosis not present

## 2021-09-19 DIAGNOSIS — J90 Pleural effusion, not elsewhere classified: Secondary | ICD-10-CM | POA: Diagnosis not present

## 2021-09-19 DIAGNOSIS — I509 Heart failure, unspecified: Secondary | ICD-10-CM | POA: Diagnosis not present

## 2021-09-19 LAB — CBC
HCT: 25.7 % — ABNORMAL LOW (ref 36.0–46.0)
Hemoglobin: 7.8 g/dL — ABNORMAL LOW (ref 12.0–15.0)
MCH: 28.4 pg (ref 26.0–34.0)
MCHC: 30.4 g/dL (ref 30.0–36.0)
MCV: 93.5 fL (ref 80.0–100.0)
Platelets: 325 10*3/uL (ref 150–400)
RBC: 2.75 MIL/uL — ABNORMAL LOW (ref 3.87–5.11)
RDW: 16.3 % — ABNORMAL HIGH (ref 11.5–15.5)
WBC: 7.6 10*3/uL (ref 4.0–10.5)
nRBC: 0 % (ref 0.0–0.2)

## 2021-09-19 LAB — COMPREHENSIVE METABOLIC PANEL
ALT: 42 U/L (ref 0–44)
AST: 34 U/L (ref 15–41)
Albumin: 2.7 g/dL — ABNORMAL LOW (ref 3.5–5.0)
Alkaline Phosphatase: 89 U/L (ref 38–126)
Anion gap: 7 (ref 5–15)
BUN: 16 mg/dL (ref 8–23)
CO2: 34 mmol/L — ABNORMAL HIGH (ref 22–32)
Calcium: 8.7 mg/dL — ABNORMAL LOW (ref 8.9–10.3)
Chloride: 97 mmol/L — ABNORMAL LOW (ref 98–111)
Creatinine, Ser: 2.59 mg/dL — ABNORMAL HIGH (ref 0.44–1.00)
GFR, Estimated: 18 mL/min — ABNORMAL LOW (ref >60)
Glucose, Bld: 153 mg/dL — ABNORMAL HIGH (ref 70–99)
Potassium: 3.8 mmol/L (ref 3.5–5.1)
Sodium: 138 mmol/L (ref 135–145)
Total Bilirubin: 0.6 mg/dL (ref 0.3–1.2)
Total Protein: 6.4 g/dL — ABNORMAL LOW (ref 6.5–8.1)

## 2021-09-19 LAB — TROPONIN I (HIGH SENSITIVITY): Troponin I (High Sensitivity): 18 ng/L — ABNORMAL HIGH (ref <18)

## 2021-09-19 NOTE — Addendum Note (Signed)
Addended by: Finis Bud on: 09/19/2021 10:24 PM   Modules accepted: Orders

## 2021-09-19 NOTE — ED Triage Notes (Addendum)
Patient here for evaluation of anemia, states she was told that a blood draw on Monday shows her hemoglobin was low. Patient states her hemoglobin has been in the 8's for several months but is unsure what the value was today. Patient denies known source of bleeding, is on dialysis, and is in no apparent distress at this time.  Patient complains of fatigue and shortness of breath that is worse when laying flat.

## 2021-09-19 NOTE — ED Notes (Signed)
NP request Lexapro be removed from med list due to QTC prolongation

## 2021-09-19 NOTE — ED Provider Triage Note (Signed)
Emergency Medicine Provider Triage Evaluation Note  Kim Burns , a 86 y.o. female  was evaluated in triage.  Pt complains of SOB, fatigue, for the past few weeks. Hemoglobin in 8s since January. Reports low Hemoglobin on Monday and was told to come here for transufision. Denies any melena or hematochezia. Patient is on a blood thinner. Son reports she might of had fluid on her lungs with her lastr CXR? MWF dialysis patient. Compliant with treatments.   Review of Systems  Positive:  Negative:   Physical Exam  BP (!) 148/74   Pulse 69   Temp 98.4 F (36.9 C) (Oral)   Resp 20   SpO2 99%  Gen:   Awake, no distress   Resp:  Normal effort  MSK:   Moves extremities without difficulty  Other:    Medical Decision Making  Medically screening exam initiated at 6:26 PM.  Appropriate orders placed.  Kim Burns was informed that the remainder of the evaluation will be completed by another provider, this initial triage assessment does not replace that evaluation, and the importance of remaining in the ED until their evaluation is complete.  Labs ordered.    Sherrell Puller, [place] 09/19/21 Bosie Helper

## 2021-09-19 NOTE — Patient Instructions (Addendum)
Medication Instructions:  STOP Coreg. STOP Lexapro  *If you need a refill on your cardiac medications before your next appointment, please call your pharmacy*   Lab Work: Your physician recommends that you return for lab work in: 1 Month  TSH and HEPATIC FUNCTION PANEL.   If you have labs (blood work) drawn today and your tests are completely normal, you will receive your results only by: [place] (if you have MyChart) OR A paper copy in the mail If you have any lab test that is abnormal or we need to change your treatment, we will call you to review the results.   Testing/Procedures: DG Chest 2V in [facility].   Follow-Up: At [facility], you and your health needs are our priority.  As part of our continuing mission to provide you with exceptional heart care, we have created designated Provider Care Teams.  These Care Teams include your primary Cardiologist (physician) and Advanced Practice Providers (APPs -  Physician Assistants and Nurse Practitioners) who all work together to provide you with the care you need, when you need it.  We recommend signing up for the patient portal called "MyChart".  Sign up information is provided on this After Visit Summary.  MyChart is used to connect with patients for Virtual Visits (Telemedicine).  Patients are able to view lab/test results, encounter notes, upcoming appointments, etc.  Non-urgent messages can be sent to your provider as well.   To learn more about what you can do with MyChart, go to [email or web address].    Your next appointment:    Sept 06/2021 at 02:45 PM with APP Almyra Deforest   Other Instructions Please see your PCP for Severe anemia.  Important Information About Sugar

## 2021-09-20 ENCOUNTER — Telehealth: Payer: Self-pay | Admitting: Nurse Practitioner

## 2021-09-20 LAB — POC OCCULT BLOOD, ED: Fecal Occult Bld: NEGATIVE

## 2021-09-20 LAB — PREPARE RBC (CROSSMATCH)

## 2021-09-20 MED ORDER — SODIUM CHLORIDE 0.9 % IV SOLN
10.0000 mL/h | Freq: Once | INTRAVENOUS | Status: AC
  Administered 2021-09-20: 10 mL/h via INTRAVENOUS

## 2021-09-20 NOTE — Telephone Encounter (Signed)
S/w patient's son, [name] who is listed on her DPN. He stated his mother is doing well after receiving blood in the [facility] ED. I mentioned to son about stopping Lexapro as this medication has a drug interaction with Amiodarone of prolonging the Qtc. Son was aware about some drug interaction but was not aware of this specific reaction and states she has not been taking Lexapro for the past month. I told him I updated her med list and removed Lexapro and told him to discuss this with her PCP. I updated him regarding her recent X-ray results and yesterday's plan of care and he verbalizes understanding. States his mother has a virtual visit with the PCP this morning at 09:30. He verbalizes understanding and was appreciative of the call.   Finis Bud, NP

## 2021-09-20 NOTE — ED Provider Notes (Signed)
[facility] EMERGENCY DEPARTMENT Provider Note   CSN: [id number] Arrival date & time: 09/19/21  1734     History  Chief Complaint  Patient presents with   Anemia    Kim Burns is a 86 y.o. female.  The history is provided by the patient and a relative.  Patient with extensive medical history presents with generalized weakness and fatigue and shortness of breath for several weeks. She reports she had multiple admissions back in July.  Ever since that time she has felt overall weak and short of breath.  She reports she has shortness of breath with exertion.  No fevers or vomiting.  No chest pain.  Denies any bloody or black stool but she is on anticoagulation.  She is a dialysis patient and goes Monday Wednesday Friday, with no missed sessions. No recent syncope.  No significant lower extremity edema.  She does describe some orthopnea.  She was called by her PCP and advised to go to the ER for anemia     Home Medications Prior to Admission medications   Medication Sig Start Date End Date Taking? Authorizing Provider  acetaminophen (TYLENOL 8 HOUR ARTHRITIS PAIN) 650 MG CR tablet Take 1,300 mg by mouth 2 (two) times daily as needed for pain.    [provider]  amiodarone (PACERONE) 200 MG tablet Take 1 tablet (200 mg total) by mouth 2 (two) times daily for 6 days, THEN 1 tablet (200 mg total) daily. 09/08/21 10/14/21  Mariel Aloe, MD  apixaban (ELIQUIS) 2.5 MG TABS tablet Take 1 tablet (2.5 mg total) by mouth 2 (two) times daily. 08/30/21 09/29/21  Vashti Hey, MD  Cholecalciferol (VITAMIN D-3 PO) Take 1 capsule by mouth every Monday, Wednesday, and Friday with hemodialysis.    [provider]  colestipol (COLESTID) 1 g tablet Take 1 g by mouth See admin instructions. 1 tablet (1g) prior to dialysis M-W-F + 1 tablet daily PRN diarrhea    [provider]  diclofenac sodium (VOLTAREN) 1 % GEL Apply 2 g topically 4 (four)  times daily as needed (for hand pain).     [provider]  diphenhydramine-acetaminophen (TYLENOL PM) 25-500 MG TABS tablet Take 1 tablet by mouth at bedtime as needed (sleep, pain).    [provider]  insulin aspart (NOVOLOG FLEXPEN) 100 UNIT/ML FlexPen Inject 10 Units into the skin 3 (three) times daily as needed (CBG > 200).    [provider]  lidocaine-prilocaine (EMLA) cream Apply 1 Application topically every Monday, Wednesday, and Friday with hemodialysis.    [provider]  lipase/protease/amylase (CREON) 36000 UNITS CPEP capsule Take 1 capsule (36,000 Units total) by mouth with snacks. Patient taking differently: Take 36,000-72,000 Units by mouth See admin instructions. 2 capsules(72000 units) TID with meals AND 1 capsule(36000 units) with snacks 08/30/21 11/28/21  Vashti Hey, MD  sevelamer carbonate (RENVELA) 800 MG tablet Take 1,600 mg by mouth 3 (three) times daily with meals.    [provider]      Allergies    Lopid [gemfibrozil], Lotemax [loteprednol etabonate], Statins, Norco [hydrocodone-acetaminophen], Other, Tomato, Vibra-tab [doxycycline], Codeine, and Hydrocodone-acetaminophen    Review of Systems   Review of Systems  Constitutional:  Negative for fever.  Respiratory:  Positive for shortness of breath.   Cardiovascular:  Negative for chest pain.  Gastrointestinal:  Negative for blood in stool and vomiting.  Genitourinary:  Negative for vaginal bleeding.  Neurological:  Negative for syncope.  Physical Exam Updated Vital Signs BP (!) 157/77   Pulse 76   Temp 99 F (37.2 C) (Oral)   Resp (!) 21   SpO2 97%  Physical Exam CONSTITUTIONAL: Elderly, no acute distress HEAD: Normocephalic/atraumatic EYES: EOMI/PERRL, conjunctiva pink ENMT: Mucous membranes moist NECK: supple no meningeal signs CV: S1/S2 noted, no murmurs/rubs/gallops noted LUNGS: Lungs are clear to auscultation bilaterally, no apparent  distress ABDOMEN: soft, nontender, no rebound or guarding, bowel sounds noted throughout abdomen Rectal-no blood or melena, Hemoccult negative, female chaperone present NEURO: Pt is awake/alert/appropriate, moves all extremitiesx4.  No facial droop.   EXTREMITIES: pulses normal/equal, full ROM, no significant lower extremity edema SKIN: warm, color normal PSYCH: no abnormalities of mood noted, alert and oriented to situation  ED Results / Procedures / Treatments   Labs (all labs ordered are listed, but only abnormal results are displayed) Labs Reviewed  COMPREHENSIVE METABOLIC PANEL - Abnormal; Notable for the following components:      Result Value   Chloride 97 (*)    CO2 34 (*)    Glucose, Bld 153 (*)    Creatinine, Ser 2.59 (*)    Calcium 8.7 (*)    Total Protein 6.4 (*)    Albumin 2.7 (*)    GFR, Estimated 18 (*)    All other components within normal limits  CBC - Abnormal; Notable for the following components:   RBC 2.75 (*)    Hemoglobin 7.8 (*)    HCT 25.7 (*)    RDW 16.3 (*)    All other components within normal limits  TROPONIN I (HIGH SENSITIVITY) - Abnormal; Notable for the following components:   Troponin I (High Sensitivity) 18 (*)    All other components within normal limits  POC OCCULT BLOOD, ED  TYPE AND SCREEN  PREPARE RBC (CROSSMATCH)    EKG None  Radiology DG Chest 2 View  Result Date: 09/19/2021 CLINICAL DATA:  Medication management. Paroxysmal atrial fibrillation. EXAM: CHEST - 2 VIEW COMPARISON:  Radiograph 09/05/2021, subsequent chest radiograph also reviewed. Chest CT 08/25/2021 FINDINGS: Normal heart size with stable mediastinal contours. Diminished left pleural effusion from radiographs last month with trace residual. There is minimal fluid in the fissures. No acute airspace disease, pulmonary edema, or pneumothorax. Surgical clips at the right thoracic inlet. Aortic atherosclerosis. IMPRESSION: Diminished left pleural effusion from radiographs  last month with trace residual. No new abnormalities. Electronically Signed   By: Keith Rake M.D.   On: 09/19/2021 22:44   DG Chest 2 View  Result Date: 09/19/2021 CLINICAL DATA:  Dyspnea EXAM: CHEST - 2 VIEW COMPARISON:  Radiographs 09/05/2021 FINDINGS: The heart size and mediastinal contours are within normal limits. Aortic calcification. Trace left pleural effusion, decreased from 09/05/2021. No pneumothorax. Surgical clips right neck. No acute osseous abnormality. IMPRESSION: Trace left pleural effusion, decreased from 09/05/2021. Electronically Signed   By: Placido Sou M.D.   On: 09/19/2021 19:39    Procedures Procedures    Medications Ordered in ED Medications  0.9 %  sodium chloride infusion (0 mL/hr Intravenous Stopped 09/20/21 0431)    ED Course/ Medical Decision Making/ A&P Clinical Course as of 09/20/21 0602  Thu Sep 20, 2021  0107 Patient agrees to accept blood products.  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  She agrees to accept blood transfusion [DW]  0136 Glucose(!): 153 Hyperglycemia [DW]  0136 Creatinine(!): 2.59 Renal failure [DW]  0136 Hemoglobin(!): 7.8 Anemia [DW]  0146 Patient with very complicated history including ESRD, A-fib on anticoagulation.  She was in the hospital multiple times back in July.  Evaluation for shortness of breath including cardiac cath which revealed mild nonobstructive CAD.  Echocardiogram did not reveal any pericardial effusion, did have diastolic dysfunction.  CT chest around that same time ruled out PE.  Today her chest x-ray is unremarkable.  She has no signs of volume overload. [DW]  0147 Her PCP sent her in for evaluation for anemia.  It appears over the past month she has been relatively stable [DW]  0147 However prior to July her hemoglobin was above 11.  Patient reports she has felt unwell since early July.  She appears dyspneic with any movement in the bed.  Her  shortness of breath may be multifactorial, but her anemia may be contributing to this.  She has no signs of acute GI bleed [DW]  0148 Patient agrees to receive 1 blood transfusion and to be reassessed. [DW]  0149 Patient may benefit from erythropoietin due to her chronic kidney disease [DW]  0218 Patient receiving blood transfusion no acute issues [DW]  0601 Patient has been monitored overnight.  She is resting comfortably.  She has tolerated blood transfusion.  Overall she feels well. [DW]  0601 She will be discharged home.  She will follow with her PCP for hemoglobin recheck, and she may need to restart erythropoietin [DW]    Clinical Course User Index [DW] Ripley Fraise, MD                           Medical Decision Making Amount and/or Complexity of Data Reviewed Labs: ordered. Decision-making details documented in ED Course.  Risk Prescription drug management.   This patient presents to the ED for concern of weakness and shortness of breath, this involves an extensive number of treatment options, and is a complaint that carries with it a high risk of complications and morbidity.  The differential diagnosis includes but is not limited to acute anemia, acute coronary syndrome, pneumonia, pulmonary edema, pericardial effusion  Comorbidities that complicate the patient evaluation: Patient's presentation is complicated by their history of ESRD, atrial fibrillation, on anticoagulation   Additional history obtained: Additional history obtained from family son at bedside Records reviewed previous admission documents and cardiology notes reviewed  Lab Tests: I Ordered, and personally interpreted labs.  The pertinent results include: Renal failure, anemia  Imaging Studies ordered: I ordered imaging studies including X-ray chest   I independently visualized and interpreted imaging which showed no acute finding I agree with the radiologist interpretation  Cardiac Monitoring: The  patient was maintained on a cardiac monitor.  I personally viewed and interpreted the cardiac monitor which showed an underlying rhythm of:  sinus rhythm  Medicines ordered and prescription drug management: I ordered medication including blood transfusion for anemia Reevaluation of the patient after these medicines showed that the patient    improved  Critical Interventions:  Blood transfusion   Reevaluation: After the interventions noted above, I reevaluated the patient and found that they have :improved  Complexity of problems addressed: Patient's presentation is most consistent with  acute presentation with potential threat to life or bodily function  Disposition: After consideration of the diagnostic results and the patient's response to treatment,  I feel that the patent would benefit from discharge   .           Final Clinical Impression(s) / ED Diagnoses Final  diagnoses:  Symptomatic anemia    Rx / DC Orders ED Discharge Orders     None         Ripley Fraise, MD 09/20/21 [phone]

## 2021-09-20 NOTE — ED Notes (Signed)
ED Provider at bedside. 

## 2021-09-21 DIAGNOSIS — N2581 Secondary hyperparathyroidism of renal origin: Secondary | ICD-10-CM | POA: Diagnosis not present

## 2021-09-21 DIAGNOSIS — N186 End stage renal disease: Secondary | ICD-10-CM | POA: Diagnosis not present

## 2021-09-21 DIAGNOSIS — Z992 Dependence on renal dialysis: Secondary | ICD-10-CM | POA: Diagnosis not present

## 2021-09-21 LAB — BPAM RBC
Blood Product Expiration Date: 202308302359
ISSUE DATE / TIME: 202308030132
Unit Type and Rh: 6200

## 2021-09-21 LAB — TYPE AND SCREEN
ABO/RH(D): A POS
Antibody Screen: NEGATIVE
Unit division: 0

## 2021-09-24 DIAGNOSIS — N2581 Secondary hyperparathyroidism of renal origin: Secondary | ICD-10-CM | POA: Diagnosis not present

## 2021-09-24 DIAGNOSIS — Z992 Dependence on renal dialysis: Secondary | ICD-10-CM | POA: Diagnosis not present

## 2021-09-24 DIAGNOSIS — I10 Essential (primary) hypertension: Secondary | ICD-10-CM | POA: Diagnosis not present

## 2021-09-24 DIAGNOSIS — N186 End stage renal disease: Secondary | ICD-10-CM | POA: Diagnosis not present

## 2021-09-24 DIAGNOSIS — D649 Anemia, unspecified: Secondary | ICD-10-CM | POA: Diagnosis not present

## 2021-09-25 ENCOUNTER — Other Ambulatory Visit: Payer: Self-pay

## 2021-09-25 ENCOUNTER — Other Ambulatory Visit (HOSPITAL_COMMUNITY): Payer: Self-pay

## 2021-09-25 ENCOUNTER — Ambulatory Visit: Payer: Medicare PPO | Admitting: Physician Assistant

## 2021-09-25 DIAGNOSIS — N186 End stage renal disease: Secondary | ICD-10-CM | POA: Diagnosis not present

## 2021-09-25 DIAGNOSIS — F411 Generalized anxiety disorder: Secondary | ICD-10-CM | POA: Diagnosis not present

## 2021-09-25 DIAGNOSIS — F331 Major depressive disorder, recurrent, moderate: Secondary | ICD-10-CM | POA: Diagnosis not present

## 2021-09-25 DIAGNOSIS — N189 Chronic kidney disease, unspecified: Secondary | ICD-10-CM | POA: Diagnosis not present

## 2021-09-25 DIAGNOSIS — I10 Essential (primary) hypertension: Secondary | ICD-10-CM | POA: Diagnosis not present

## 2021-09-25 DIAGNOSIS — G4700 Insomnia, unspecified: Secondary | ICD-10-CM | POA: Diagnosis not present

## 2021-09-25 DIAGNOSIS — D631 Anemia in chronic kidney disease: Secondary | ICD-10-CM | POA: Diagnosis not present

## 2021-09-25 DIAGNOSIS — Z992 Dependence on renal dialysis: Secondary | ICD-10-CM | POA: Diagnosis not present

## 2021-09-25 DIAGNOSIS — D649 Anemia, unspecified: Secondary | ICD-10-CM | POA: Diagnosis not present

## 2021-09-25 MED ORDER — APIXABAN 2.5 MG PO TABS: 2.5000 mg | ORAL_TABLET | Freq: Two times a day (BID) | ORAL | 5 refills | Status: DC

## 2021-09-25 NOTE — Telephone Encounter (Signed)
Prescription refill request for Eliquis received. Indication: Atrial Fib Last office visit: 09/19/21  Annita Brod NP Scr: 2.59 on 09/19/21 Age: 86 Weight: 57.3kg  Based on above findings Eliquis 2.'5mg'$  twice daily is the appropriate dose.  Refill approved.

## 2021-09-26 ENCOUNTER — Other Ambulatory Visit: Payer: Self-pay | Admitting: Family Medicine

## 2021-09-26 ENCOUNTER — Ambulatory Visit
Admission: RE | Admit: 2021-09-26 | Discharge: 2021-09-26 | Disposition: A | Discharge status: Home / Self Care | Payer: Medicare PPO | Source: Ambulatory Visit | Attending: Family Medicine | Admitting: Family Medicine

## 2021-09-26 DIAGNOSIS — N186 End stage renal disease: Secondary | ICD-10-CM | POA: Diagnosis not present

## 2021-09-26 DIAGNOSIS — I4891 Unspecified atrial fibrillation: Secondary | ICD-10-CM

## 2021-09-26 DIAGNOSIS — M47814 Spondylosis without myelopathy or radiculopathy, thoracic region: Secondary | ICD-10-CM | POA: Diagnosis not present

## 2021-09-26 DIAGNOSIS — N2581 Secondary hyperparathyroidism of renal origin: Secondary | ICD-10-CM | POA: Diagnosis not present

## 2021-09-26 DIAGNOSIS — Z992 Dependence on renal dialysis: Secondary | ICD-10-CM | POA: Diagnosis not present

## 2021-09-26 DIAGNOSIS — I700 Atherosclerosis of aorta: Secondary | ICD-10-CM | POA: Diagnosis not present

## 2021-09-28 DIAGNOSIS — N186 End stage renal disease: Secondary | ICD-10-CM | POA: Diagnosis not present

## 2021-09-28 DIAGNOSIS — N2581 Secondary hyperparathyroidism of renal origin: Secondary | ICD-10-CM | POA: Diagnosis not present

## 2021-09-28 DIAGNOSIS — Z992 Dependence on renal dialysis: Secondary | ICD-10-CM | POA: Diagnosis not present

## 2021-10-01 ENCOUNTER — Other Ambulatory Visit: Payer: Self-pay

## 2021-10-01 ENCOUNTER — Telehealth: Payer: Self-pay | Admitting: Cardiology

## 2021-10-01 ENCOUNTER — Encounter: Payer: Self-pay | Admitting: *Deleted

## 2021-10-01 ENCOUNTER — Ambulatory Visit
Admission: RE | Admit: 2021-10-01 | Discharge: 2021-10-01 | Disposition: A | Discharge status: Home / Self Care | Payer: Medicare PPO | Source: Ambulatory Visit | Attending: Interventional Radiology | Admitting: Interventional Radiology

## 2021-10-01 DIAGNOSIS — N2889 Other specified disorders of kidney and ureter: Secondary | ICD-10-CM | POA: Diagnosis not present

## 2021-10-01 DIAGNOSIS — I4891 Unspecified atrial fibrillation: Secondary | ICD-10-CM | POA: Diagnosis not present

## 2021-10-01 DIAGNOSIS — N186 End stage renal disease: Secondary | ICD-10-CM | POA: Diagnosis not present

## 2021-10-01 DIAGNOSIS — I10 Essential (primary) hypertension: Secondary | ICD-10-CM | POA: Diagnosis not present

## 2021-10-01 DIAGNOSIS — Z9889 Other specified postprocedural states: Secondary | ICD-10-CM | POA: Diagnosis not present

## 2021-10-01 DIAGNOSIS — N2581 Secondary hyperparathyroidism of renal origin: Secondary | ICD-10-CM | POA: Diagnosis not present

## 2021-10-01 DIAGNOSIS — D649 Anemia, unspecified: Secondary | ICD-10-CM | POA: Diagnosis not present

## 2021-10-01 DIAGNOSIS — Z992 Dependence on renal dialysis: Secondary | ICD-10-CM | POA: Diagnosis not present

## 2021-10-01 HISTORY — PX: IR RADIOLOGIST EVAL & MGMT: IMG5224

## 2021-10-01 MED ORDER — AMIODARONE HCL 200 MG PO TABS: 200.0000 mg | ORAL_TABLET | Freq: Every day | ORAL | 3 refills | Status: DC

## 2021-10-01 MED ORDER — AMIODARONE HCL 200 MG PO TABS: 200.0000 mg | ORAL_TABLET | Freq: Every day | ORAL | 1 refills | Status: DC

## 2021-10-01 NOTE — Telephone Encounter (Signed)
*  STAT* If patient is at the pharmacy, call can be transferred to refill team.   1. Which medications need to be refilled? (please list name of each medication and dose if known) amiodarone (PACERONE) 200 MG tablet  2. Which pharmacy/location (including street and city if local pharmacy) is medication to be sent to? [place], [place] - [address]  3. Do they need a 30 day or 90 day supply? 30 day  Patient is out of medication

## 2021-10-01 NOTE — Progress Notes (Signed)
Patient ID: Kim Burns, female   DOB: [birth year], 86 y.o.   MRN: [id number]        Chief Complaint: Left sided RCC, post embolization and microwave ablation (08/22/2021)  Referring Physician(s): Herrick,Benjamin W   History of Present Illness: Kim Burns is a 86 y.o. female with past medical history significant for stroke, diabetes (post Whipple for IPMN with high-grade dysplasia), bradycardia, anemia of chronic disease and chronic renal insufficiency, now progressed to end-stage renal disease, initiated on dialysis in January of this year who is seen today in telemedicine consultation following technically successful left-sided renal embolization and microwave ablation performed 08/22/2021.  The patient is accompanied by her son on the telemedicine consultation though serves as her primary historian.  Unfortunately, the patient's postoperative course has been complicated by development of atrial fibrillation, recurring two separate admissions to the hospital, recently started on anticoagulation.  Patient states that she feels she is still recovering from the procedure as she has persistent fatigue and shortness of breath though is able to participate with activities of daily living.  The patient denies left-sided flank pain.  No fever or chills.  Past Medical History:  Diagnosis Date   Anemia of chronic disease    takes iron   Anxiety    Blood transfusion without reported diagnosis    Bradycardia    Diabetes mellitus without complication ([facility])    became diabetic after Whipple procedure   Diarrhea    Dysrhythmia 04/16/2016   bradycardia due to medication    ESRD on hemodialysis [facility])    M-W-F   GERD (gastroesophageal reflux disease)    Gout    Headache    History of kidney stones 06/2013   Hyperparathyroidism ([place])    Hypertension    PONV (postoperative nausea and vomiting)    Sleep apnea    no cpap machine. could not tolerate   Stroke ([place]) 04/16/2016   TIA 1995    Vitamin D deficiency     Past Surgical History:  Procedure Laterality Date   A/V FISTULAGRAM Left 08/30/2021   Procedure: A/V Fistulagram;  Surgeon: Marty Heck, MD;  Location: [facility];  Service: Cardiovascular;  Laterality: Left;   ABDOMINAL HYSTERECTOMY  1985   complete   BACK SURGERY  1856   lower   BASCILIC VEIN TRANSPOSITION Left 04/24/2017   Procedure: LEFT ARM FIRST STAGE BASILIC VEIN TRANSPOSITION;  Surgeon: Serafina Mitchell, MD;  Location: [facility];  Service: Vascular;  Laterality: Left;   [place] Left 07/10/2017   Procedure: SECOND STAGE BASILIC VEIN TRANSPOSITION LEFT ARM;  Surgeon: Serafina Mitchell, MD;  Location: [facility];  Service: Vascular;  Laterality: Left;   BREAST LUMPECTOMY Left x 2   many years apart, benign   CHOLECYSTECTOMY  1985   CYSTOSCOPY WITH URETEROSCOPY AND STENT PLACEMENT Left 06/18/2013   Procedure: CYSTOSCOPY WITH Lef URETEROSCOPY AND Left STENT PLACEMENT;  Surgeon: Dutch Gray, MD;  Location: [facility];  Service: Urology;  Laterality: Left;   ESOPHAGOGASTRODUODENOSCOPY (EGD) WITH PROPOFOL N/A 11/13/2016   Procedure: ESOPHAGOGASTRODUODENOSCOPY (EGD) WITH PROPOFOL;  Surgeon: Otis Brace, MD;  Location: [facility];  Service: Gastroenterology;  Laterality: N/A;   EUS N/A 02/07/2016   Procedure: ESOPHAGEAL ENDOSCOPIC ULTRASOUND (EUS) RADIAL;  Surgeon: Arta Silence, MD;  [facility];  Service: Endoscopy;  Laterality: N/A;   EYE SURGERY Bilateral 2014   ioc for catracts    FOOT SURGERY Left 1990   something with toes unsure what  HERNIA REPAIR  2018   IR EMBO TUMOR ORGAN ISCHEMIA INFARCT INC GUIDE ROADMAPPING  08/22/2021   IR RADIOLOGIST EVAL & MGMT  05/18/2021   IR RENAL SUPRASEL UNI S&I MOD SED  08/22/2021   IR US GUIDE VASC ACCESS RIGHT  08/22/2021   LEFT HEART CATH AND CORONARY ANGIOGRAPHY N/A 08/27/2021   Procedure: LEFT HEART CATH AND CORONARY ANGIOGRAPHY;  Surgeon: Burnell Blanks, MD;  Location:  [facility];  Service: Cardiovascular;  Laterality: N/A;   PARATHYROID EXPLORATION     PERIPHERAL VASCULAR BALLOON ANGIOPLASTY Left 08/30/2021   Procedure: PERIPHERAL VASCULAR BALLOON ANGIOPLASTY;  Surgeon: Marty Heck, MD;  Location: [facility];  Service: Cardiovascular;  Laterality: Left;  arm fistula   RADIOLOGY WITH ANESTHESIA Left 08/22/2021   Procedure: MICROWAVE ABLATION;  Surgeon: Sandi Mariscal, MD;  Location[facility];  Service: Anesthesiology;  Laterality: Left;   WHIPPLE PROCEDURE N/A 04/23/2016   Procedure: WHIPPLE PROCEDURE;  Surgeon: Stark Klein, MD;  Location: [facility];  Service: General;  Laterality: N/A;    Allergies: Lopid [gemfibrozil], Lotemax [loteprednol etabonate], Statins, Norco [hydrocodone-acetaminophen], Other, Tomato, Vibra-tab [doxycycline], Codeine, and Hydrocodone-acetaminophen  Medications: Prior to Admission medications   Medication Sig Start Date End Date Taking? Authorizing Provider  acetaminophen (TYLENOL 8 HOUR ARTHRITIS PAIN) 650 MG CR tablet Take 1,300 mg by mouth 2 (two) times daily as needed for pain.    [provider]  amiodarone (PACERONE) 200 MG tablet Take 1 tablet (200 mg total) by mouth daily. 10/01/21   Martinique, Peter M, MD  apixaban (ELIQUIS) 2.5 MG TABS tablet Take 1 tablet (2.5 mg total) by mouth 2 (two) times daily. 09/25/21 03/24/22  Martinique, Peter M, MD  Cholecalciferol (VITAMIN D-3 PO) Take 1 capsule by mouth every Monday, Wednesday, and Friday with hemodialysis.    [provider]  colestipol (COLESTID) 1 g tablet Take 1 g by mouth See admin instructions. 1 tablet (1g) prior to dialysis M-W-F + 1 tablet daily PRN diarrhea    [provider]  diclofenac sodium (VOLTAREN) 1 % GEL Apply 2 g topically 4 (four) times daily as needed (for hand pain).     [provider]  diphenhydramine-acetaminophen (TYLENOL PM) 25-500 MG TABS tablet Take 1 tablet by mouth at bedtime as needed (sleep, pain).     [provider]  insulin aspart (NOVOLOG FLEXPEN) 100 UNIT/ML FlexPen Inject 10 Units into the skin 3 (three) times daily as needed (CBG > 200).    [provider]  lidocaine-prilocaine (EMLA) cream Apply 1 Application topically every Monday, Wednesday, and Friday with hemodialysis.    [provider]  lipase/protease/amylase (CREON) 36000 UNITS CPEP capsule Take 1 capsule (36,000 Units total) by mouth with snacks. Patient taking differently: Take 36,000-72,000 Units by mouth See admin instructions. 2 capsules(72000 units) TID with meals AND 1 capsule(36000 units) with snacks 08/30/21 11/28/21  Vashti Hey, MD  sevelamer carbonate (RENVELA) 800 MG tablet Take 1,600 mg by mouth 3 (three) times daily with meals.    [provider]     Family History  Problem Relation Age of Onset   Heart disease Mother    Stroke Mother    Cancer Father        colon   Alzheimer's disease Sister    Alzheimer's disease Sister    Cancer Brother        brain   Breast cancer Neg Hx     Social History   Socioeconomic History  Marital status: Widowed    Spouse name: Not on file   Number of children: Not on file   Years of education: Not on file   Highest education level: Not on file  Occupational History   Not on file  Tobacco Use   Smoking status: Never   Smokeless tobacco: Never  Vaping Use   Vaping Use: Never used  Substance and Sexual Activity   Alcohol use: No   Drug use: No   Sexual activity: Not on file  Other Topics Concern   Not on file  Social History Narrative   Not on file   Social Determinants of Health   Financial Resource Strain: Not on file  Food Insecurity: Not on file  Transportation Needs: Not on file  Physical Activity: Not on file  Stress: Not on file  Social Connections: Not on file    ECOG Status: 2 - Symptomatic, <50% confined to bed  Review of Systems  Review of Systems: A 12 point ROS discussed and pertinent  positives are indicated in the HPI above.  All other systems are negative.   Physical Exam No direct physical exam was performed (except for noted visual exam findings with Video Visits).   Vital Signs: There were no vitals taken for this visit.  Imaging:  Percutaneous embolization and microwave ablation-08/22/2021 CT abdomen pelvis-09/07/2020; 08/25/2021; 09/06/2021.  Review of contrast-enhanced abdominal CT performed 08/25/2021 (performed for evaluation of tachycardia) demonstrates a technically excellent result without evidence of residual enhancement involving the treated lesion involving the inferior medial aspect of the left kidney.  Noncontrast abdominal CT performed 09/06/2021 (obtained prior to initiation of anticoagulation), was negative for evidence of a delayed postprocedural complication.    DG Chest 2 View  Result Date: 09/27/2021 CLINICAL DATA:  Atrial fibrillation EXAM: CHEST - 2 VIEW COMPARISON:  Chest radiograph 09/19/2021 FINDINGS: Stable cardiac and mediastinal contours. Aortic atherosclerosis. Bilateral hilar prominence. Pulmonary vascular redistribution. No large area pulmonary consolidation. No pleural effusion or pneumothorax. Thoracic spine degenerative changes. IMPRESSION: No active cardiopulmonary disease. Electronically Signed   By: Lovey Newcomer M.D.   On: 09/27/2021 08:29   DG Chest 2 View  Result Date: 09/19/2021 CLINICAL DATA:  Medication management. Paroxysmal atrial fibrillation. EXAM: CHEST - 2 VIEW COMPARISON:  Radiograph 09/05/2021, subsequent chest radiograph also reviewed. Chest CT 08/25/2021 FINDINGS: Normal heart size with stable mediastinal contours. Diminished left pleural effusion from radiographs last month with trace residual. There is minimal fluid in the fissures. No acute airspace disease, pulmonary edema, or pneumothorax. Surgical clips at the right thoracic inlet. Aortic atherosclerosis. IMPRESSION: Diminished left pleural effusion from radiographs last  month with trace residual. No new abnormalities. Electronically Signed   By: Keith Rake M.D.   On: 09/19/2021 22:44   DG Chest 2 View  Result Date: 09/19/2021 CLINICAL DATA:  Dyspnea EXAM: CHEST - 2 VIEW COMPARISON:  Radiographs 09/05/2021 FINDINGS: The heart size and mediastinal contours are within normal limits. Aortic calcification. Trace left pleural effusion, decreased from 09/05/2021. No pneumothorax. Surgical clips right neck. No acute osseous abnormality. IMPRESSION: Trace left pleural effusion, decreased from 09/05/2021. Electronically Signed   By: Placido Sou M.D.   On: 09/19/2021 19:39   CT ABDOMEN PELVIS WO CONTRAST  Result Date: 09/06/2021 CLINICAL DATA:  Kidney cancer, status post ablation. Shortness of breath and dizziness. EXAM: CT ABDOMEN AND PELVIS WITHOUT CONTRAST TECHNIQUE: Multidetector CT imaging of the abdomen and pelvis was performed following the standard protocol without IV contrast. RADIATION DOSE REDUCTION: This exam  was performed according to the departmental dose-optimization program which includes automated exposure control, adjustment of the mA and/or kV according to patient size and/or use of iterative reconstruction technique. COMPARISON:  08/25/2021. FINDINGS: Lower chest: The heart is normal in size and there is a trace pericardial effusion. Coronary artery calcifications are noted. There is a small pleural effusion on the left. Atelectasis is noted at the lung bases bilaterally. Hepatobiliary: No focal liver abnormality is seen. Status post cholecystectomy. No biliary dilatation. Pancreas: The pancreas is not well delineated due to lack of IV contrast. The patient is status post Whipple procedure by history with pancreatic atrophy. Spleen: Normal in size without focal abnormality. Adrenals/Urinary Tract: No adrenal nodule or mass. Multiple cysts and hyperdense lesions are present in the kidneys bilaterally and not well evaluated due to lack of IV contrast. The  hyperdense lesions in the left kidney are new from the previous exam. A previously described enhancing solid lesion in the medial aspect of the lower pole of the left kidney has undergone recent ablation and coil embolization by history. Increased perinephric fat stranding is noted on the left, possible edema versus blood products. Evaluation for residual neoplasm is limited due to lack of IV contrast. A few renal calculi are noted bilaterally. No hydronephrosis. The bladder is decompressed. Stomach/Bowel: Postsurgical changes are noted in the upper abdomen, consistent with Whipple procedure. Appendix is not seen. No evidence of bowel wall thickening, distention, or inflammatory changes. No free air or pneumatosis. Diverticula are noted along the sigmoid colon without evidence of diverticulitis. Vascular/Lymphatic: Aortic atherosclerosis. No enlarged abdominal or pelvic lymph nodes. Reproductive: Status post hysterectomy. No adnexal masses. Other: Multifocal fat containing hernias in the anterior abdominal wall in the midline superior to the umbilicus. No abdominopelvic ascites. Musculoskeletal: Degenerative changes are present in the thoracolumbar spine. No acute or suspicious osseous abnormality. IMPRESSION: 1. Status post renal mass ablation with embolization with stable mass in the medial aspect of the left kidney. Evaluation is limited due to lack of IV contrast. Increased perinephric fat stranding on the left may represent edema or blood products. New hyperdense lesions are noted in the left kidney suggesting hemorrhagic or proteinaceous cysts. 2. Small left pleural effusion with atelectasis at the lung bases. 3. Aortic atherosclerosis. 4. Remaining incidental findings as described above. Electronically Signed   By: Brett Fairy M.D.   On: 09/06/2021 03:52   DG Chest 2 View  Result Date: 09/05/2021 CLINICAL DATA:  Shortness of breath EXAM: CHEST - 2 VIEW COMPARISON:  11/12/2016 FINDINGS: The heart size  and mediastinal contours are within normal limits. Both lungs are clear. The visualized skeletal structures are unremarkable. IMPRESSION: No active cardiopulmonary disease. Electronically Signed   By: Ulyses Jarred M.D.   On: 09/05/2021 19:39    Labs:  CBC: Recent Labs    08/31/21 0518 09/05/21 1837 09/07/21 0521 09/19/21 1825  WBC 10.5 18.1* 15.7* 7.6  HGB 8.0* 8.8* 8.1* 7.8*  HCT 24.1* 29.0* 26.2* 25.7*  PLT 351 586* 503* 325    COAGS: Recent Labs    08/22/21 0740 09/05/21 2115  INR 0.9 1.3*    BMP: Recent Labs    08/31/21 0518 09/05/21 1837 09/07/21 0521 09/19/21 1825  NA 134* 137 135 138  K 3.5 3.4* 3.7 3.8  CL 96* 96* 98 97*  CO2 '26 29 27 '$ 34*  GLUCOSE 143* 270* 138* 153*  BUN 17 16 31* 16  CALCIUM 8.1* 8.3* 8.7* 8.7*  CREATININE 2.92* 2.67* 4.39* 2.59*  GFRNONAA 15* 17* 9* 18*    LIVER FUNCTION TESTS: Recent Labs    02/23/21 1204 08/25/21 1917 09/05/21 1837 09/07/21 0521 09/19/21 1825  BILITOT 0.6 0.8 0.3  --  0.6  AST 51* 21 18  --  34  ALT 70* 23 19  --  42  ALKPHOS 45 87 83  --  89  PROT 6.5 6.6 5.8*  --  6.4*  ALBUMIN 3.4* 2.9* 2.3* 2.1* 2.7*    TUMOR MARKERS: No results for input(s): "AFPTM", "CEA", "CA199", "CHROMGRNA" in the last 8760 hours.  Assessment and Plan:  Kim Burns is a 86 y.o. female with past medical history significant for stroke, diabetes (post Whipple for IPMN with high-grade dysplasia), bradycardia, anemia of chronic disease and chronic renal insufficiency, now progressed to end-stage renal disease, initiated on dialysis in January of this year who is seen today in telemedicine consultation following technically successful left-sided renal embolization and microwave ablation performed 08/22/2021.  Unfortunately, the patient's postoperative course has been complicated by development of atrial fibrillation, recurring two separate admissions to the hospital, recently started on anticoagulation.  Patient states that she  feels she is still recovering from the procedure as she has persistent fatigue and shortness of breath though is able to participate with activities of daily living.  The patient denies left-sided flank pain.  No fever or chills.  While the patient's postoperative course has been complicated and challenging I am hopeful that ultimately the embolization and ablation will prove worthwhile in regards to treatment of the left-sided renal cell carcinoma.  Again, I explained to the patient that the procedure was performed with curative intent however as surgical margins were not obtained, dedicated imaging will be performed to ensure complete treatment.  Initial dedicated postprocedural renal protocol CT scan of the abdomen and pelvis with will obtain 3 months following the cryoablation (early October 2023).  (Note, CT imaging will be pursued as patient is on dialysis).  Plan: - Follow-up telemedicine consultation following acquisition of initial postprocedural renal protocol CT scan of the abdomen pelvis in early October 2023.  A copy of this report was sent to the requesting provider on this date.  Electronically Signed: Sandi Mariscal 10/01/2021, 12:24 PM   I spent a total of 10 Minutes in remote  clinical consultation, greater than 50% of which was counseling/coordinating care for post left sided renal embolization and ablation.    Visit type: Audio only (telephone). Audio (no video) only due to patient's lack of internet/smartphone capability. Alternative for in-person consultation at [facility], [address] [place], [place], [place]. This visit type was conducted due to national recommendations for restrictions regarding the COVID-19 Pandemic (e.g. social distancing).  This format is felt to be most appropriate for this patient at this time.  All issues noted in this document were discussed and addressed.

## 2021-10-03 DIAGNOSIS — N2581 Secondary hyperparathyroidism of renal origin: Secondary | ICD-10-CM | POA: Diagnosis not present

## 2021-10-03 DIAGNOSIS — N186 End stage renal disease: Secondary | ICD-10-CM | POA: Diagnosis not present

## 2021-10-03 DIAGNOSIS — Z992 Dependence on renal dialysis: Secondary | ICD-10-CM | POA: Diagnosis not present

## 2021-10-04 DIAGNOSIS — R0602 Shortness of breath: Secondary | ICD-10-CM | POA: Diagnosis not present

## 2021-10-04 DIAGNOSIS — I10 Essential (primary) hypertension: Secondary | ICD-10-CM | POA: Diagnosis not present

## 2021-10-04 DIAGNOSIS — I4891 Unspecified atrial fibrillation: Secondary | ICD-10-CM | POA: Diagnosis not present

## 2021-10-04 DIAGNOSIS — N186 End stage renal disease: Secondary | ICD-10-CM | POA: Diagnosis not present

## 2021-10-04 DIAGNOSIS — D649 Anemia, unspecified: Secondary | ICD-10-CM | POA: Diagnosis not present

## 2021-10-04 DIAGNOSIS — D631 Anemia in chronic kidney disease: Secondary | ICD-10-CM | POA: Diagnosis not present

## 2021-10-04 DIAGNOSIS — N183 Chronic kidney disease, stage 3 unspecified: Secondary | ICD-10-CM | POA: Diagnosis not present

## 2021-10-05 DIAGNOSIS — N186 End stage renal disease: Secondary | ICD-10-CM | POA: Diagnosis not present

## 2021-10-05 DIAGNOSIS — Z992 Dependence on renal dialysis: Secondary | ICD-10-CM | POA: Diagnosis not present

## 2021-10-05 DIAGNOSIS — N2581 Secondary hyperparathyroidism of renal origin: Secondary | ICD-10-CM | POA: Diagnosis not present

## 2021-10-08 DIAGNOSIS — Z992 Dependence on renal dialysis: Secondary | ICD-10-CM | POA: Diagnosis not present

## 2021-10-08 DIAGNOSIS — N2581 Secondary hyperparathyroidism of renal origin: Secondary | ICD-10-CM | POA: Diagnosis not present

## 2021-10-08 DIAGNOSIS — N186 End stage renal disease: Secondary | ICD-10-CM | POA: Diagnosis not present

## 2021-10-09 ENCOUNTER — Telehealth: Payer: Self-pay | Admitting: Cardiology

## 2021-10-09 NOTE — Telephone Encounter (Signed)
Pt c/o medication issue:  1. Name of Medication: apixaban (ELIQUIS) 2.5 MG TABS tablet  2. How are you currently taking this medication (dosage and times per day)? Take 1 tablet (2.5 mg total) by mouth 2 (two) times daily.  3. Are you having a reaction (difficulty breathing--STAT)? no  4. What is your medication issue? Pt states she has an itch that it really bad all throughout the day and night. She states she spoke to her PCP and they told her to speak to [place] about it. Please advise.

## 2021-10-09 NOTE — Telephone Encounter (Signed)
Patient reports itching all over. She believes it may be from the apixaban. She went on to talk about her HD experience this past Friday. She said when the HD catheter was removed, she bled about a cup of blood. She asked for a nurse to manage her HD, not a [employer]. She is scared of bleeding because she has to have blood transfusions and Epogen injections. I recommended that she speak with her nephrologist about her concerns. Please advise on generalized itching and apixaban.

## 2021-10-10 DIAGNOSIS — Z992 Dependence on renal dialysis: Secondary | ICD-10-CM | POA: Diagnosis not present

## 2021-10-10 DIAGNOSIS — N2581 Secondary hyperparathyroidism of renal origin: Secondary | ICD-10-CM | POA: Diagnosis not present

## 2021-10-10 DIAGNOSIS — N186 End stage renal disease: Secondary | ICD-10-CM | POA: Diagnosis not present

## 2021-10-10 NOTE — Telephone Encounter (Signed)
There are many reasons she could be itching. It could be medication related but but it is not infrequent that patient's with ESRD on dialysis itch. We will monitor bleeding. At this point I think the balance is still in favor of continuing Eliquis. If she requires frequent transfusions or has recurrent GI bleeding we can readdress.  Darrin Koman Martinique MD, [facility]

## 2021-10-10 NOTE — Telephone Encounter (Signed)
Spoke to patient Dr.Jordan's advice given. 

## 2021-10-11 DIAGNOSIS — H04123 Dry eye syndrome of bilateral lacrimal glands: Secondary | ICD-10-CM | POA: Diagnosis not present

## 2021-10-12 DIAGNOSIS — N186 End stage renal disease: Secondary | ICD-10-CM | POA: Diagnosis not present

## 2021-10-12 DIAGNOSIS — Z992 Dependence on renal dialysis: Secondary | ICD-10-CM | POA: Diagnosis not present

## 2021-10-12 DIAGNOSIS — N2581 Secondary hyperparathyroidism of renal origin: Secondary | ICD-10-CM | POA: Diagnosis not present

## 2021-10-15 DIAGNOSIS — Z992 Dependence on renal dialysis: Secondary | ICD-10-CM | POA: Diagnosis not present

## 2021-10-15 DIAGNOSIS — N2581 Secondary hyperparathyroidism of renal origin: Secondary | ICD-10-CM | POA: Diagnosis not present

## 2021-10-15 DIAGNOSIS — L299 Pruritus, unspecified: Secondary | ICD-10-CM | POA: Diagnosis not present

## 2021-10-15 DIAGNOSIS — N186 End stage renal disease: Secondary | ICD-10-CM | POA: Diagnosis not present

## 2021-10-16 ENCOUNTER — Ambulatory Visit: Payer: Medicare PPO | Admitting: Dermatology

## 2021-10-17 DIAGNOSIS — Z992 Dependence on renal dialysis: Secondary | ICD-10-CM | POA: Diagnosis not present

## 2021-10-17 DIAGNOSIS — N2581 Secondary hyperparathyroidism of renal origin: Secondary | ICD-10-CM | POA: Diagnosis not present

## 2021-10-17 DIAGNOSIS — L299 Pruritus, unspecified: Secondary | ICD-10-CM | POA: Diagnosis not present

## 2021-10-17 DIAGNOSIS — N186 End stage renal disease: Secondary | ICD-10-CM | POA: Diagnosis not present

## 2021-10-18 DIAGNOSIS — N186 End stage renal disease: Secondary | ICD-10-CM | POA: Diagnosis not present

## 2021-10-18 DIAGNOSIS — Z992 Dependence on renal dialysis: Secondary | ICD-10-CM | POA: Diagnosis not present

## 2021-10-18 DIAGNOSIS — I158 Other secondary hypertension: Secondary | ICD-10-CM | POA: Diagnosis not present

## 2021-10-19 DIAGNOSIS — Z992 Dependence on renal dialysis: Secondary | ICD-10-CM | POA: Diagnosis not present

## 2021-10-19 DIAGNOSIS — L299 Pruritus, unspecified: Secondary | ICD-10-CM | POA: Diagnosis not present

## 2021-10-19 DIAGNOSIS — N2581 Secondary hyperparathyroidism of renal origin: Secondary | ICD-10-CM | POA: Diagnosis not present

## 2021-10-19 DIAGNOSIS — N186 End stage renal disease: Secondary | ICD-10-CM | POA: Diagnosis not present

## 2021-10-22 DIAGNOSIS — N2581 Secondary hyperparathyroidism of renal origin: Secondary | ICD-10-CM | POA: Diagnosis not present

## 2021-10-22 DIAGNOSIS — L299 Pruritus, unspecified: Secondary | ICD-10-CM | POA: Diagnosis not present

## 2021-10-22 DIAGNOSIS — N186 End stage renal disease: Secondary | ICD-10-CM | POA: Diagnosis not present

## 2021-10-22 DIAGNOSIS — Z992 Dependence on renal dialysis: Secondary | ICD-10-CM | POA: Diagnosis not present

## 2021-10-23 ENCOUNTER — Ambulatory Visit: Payer: Medicare PPO | Admitting: Physician Assistant

## 2021-10-24 DIAGNOSIS — Z23 Encounter for immunization: Secondary | ICD-10-CM | POA: Diagnosis not present

## 2021-10-24 DIAGNOSIS — I10 Essential (primary) hypertension: Secondary | ICD-10-CM | POA: Diagnosis not present

## 2021-10-24 DIAGNOSIS — N2581 Secondary hyperparathyroidism of renal origin: Secondary | ICD-10-CM | POA: Diagnosis not present

## 2021-10-24 DIAGNOSIS — I509 Heart failure, unspecified: Secondary | ICD-10-CM | POA: Diagnosis not present

## 2021-10-24 DIAGNOSIS — L299 Pruritus, unspecified: Secondary | ICD-10-CM | POA: Diagnosis not present

## 2021-10-24 DIAGNOSIS — M109 Gout, unspecified: Secondary | ICD-10-CM | POA: Diagnosis not present

## 2021-10-24 DIAGNOSIS — N186 End stage renal disease: Secondary | ICD-10-CM | POA: Diagnosis not present

## 2021-10-24 DIAGNOSIS — D649 Anemia, unspecified: Secondary | ICD-10-CM | POA: Diagnosis not present

## 2021-10-24 DIAGNOSIS — Z992 Dependence on renal dialysis: Secondary | ICD-10-CM | POA: Diagnosis not present

## 2021-10-25 DIAGNOSIS — Z79899 Other long term (current) drug therapy: Secondary | ICD-10-CM | POA: Diagnosis not present

## 2021-10-26 ENCOUNTER — Telehealth: Payer: Self-pay

## 2021-10-26 DIAGNOSIS — N2581 Secondary hyperparathyroidism of renal origin: Secondary | ICD-10-CM | POA: Diagnosis not present

## 2021-10-26 DIAGNOSIS — I4891 Unspecified atrial fibrillation: Secondary | ICD-10-CM

## 2021-10-26 DIAGNOSIS — Z992 Dependence on renal dialysis: Secondary | ICD-10-CM | POA: Diagnosis not present

## 2021-10-26 DIAGNOSIS — L299 Pruritus, unspecified: Secondary | ICD-10-CM | POA: Diagnosis not present

## 2021-10-26 DIAGNOSIS — E1122 Type 2 diabetes mellitus with diabetic chronic kidney disease: Secondary | ICD-10-CM

## 2021-10-26 DIAGNOSIS — N186 End stage renal disease: Secondary | ICD-10-CM | POA: Diagnosis not present

## 2021-10-26 LAB — HEPATIC FUNCTION PANEL
ALT: 24 IU/L (ref 0–32)
AST: 15 IU/L (ref 0–40)
Albumin: 3.5 g/dL — ABNORMAL LOW (ref 3.7–4.7)
Alkaline Phosphatase: 98 IU/L (ref 44–121)
Bilirubin Total: 0.3 mg/dL (ref 0.0–1.2)
Bilirubin, Direct: 0.14 mg/dL (ref 0.00–0.40)
Total Protein: 6.7 g/dL (ref 6.0–8.5)

## 2021-10-26 LAB — TSH: TSH: 1.48 u[IU]/mL (ref 0.450–4.500)

## 2021-10-26 NOTE — Telephone Encounter (Signed)
   Telephone encounter was:  Successful.  10/26/2021 Name: Kim Burns MRN: [id number] DOB: 04-21-1935  [name] is a 86 y.o. year old female who is a primary care patient of Ernie Hew Mechele Claude, MD . The community resource team was consulted for assistance with Transportation Needs   Care guide performed the following interventions: Patient provided with information about care guide support team and interviewed to confirm resource needs. Patient requested I speak to son Kim Burns. Mr. Kim Burns advised patient has a walker and needs ongoing transportation to her dialysis/doctors appointments in [place]. CG will check with [employer] as [facility] dooes not travel to [place].  Follow Up Plan:  Care guide will outreach resources to assist patient with Transportation  [place], [place] management  [place], [place]  Main Phone: [phone]  E-mail: [email or web address]  Website: [email or web address]

## 2021-10-29 ENCOUNTER — Other Ambulatory Visit: Payer: Self-pay

## 2021-10-29 ENCOUNTER — Encounter (HOSPITAL_COMMUNITY): Payer: Self-pay

## 2021-10-29 ENCOUNTER — Emergency Department (HOSPITAL_COMMUNITY): Payer: Medicare PPO

## 2021-10-29 ENCOUNTER — Telehealth: Payer: Self-pay

## 2021-10-29 ENCOUNTER — Other Ambulatory Visit: Payer: Self-pay | Admitting: Interventional Radiology

## 2021-10-29 ENCOUNTER — Inpatient Hospital Stay (HOSPITAL_COMMUNITY)
Admission: EM | Admit: 2021-10-29 | Discharge: 2021-11-04 | DRG: 853 | Disposition: A | Discharge status: Home Health | Payer: Medicare PPO | Source: Ambulatory Visit | Attending: Student | Admitting: Student

## 2021-10-29 DIAGNOSIS — E1322 Other specified diabetes mellitus with diabetic chronic kidney disease: Secondary | ICD-10-CM | POA: Diagnosis present

## 2021-10-29 DIAGNOSIS — I700 Atherosclerosis of aorta: Secondary | ICD-10-CM | POA: Diagnosis not present

## 2021-10-29 DIAGNOSIS — C642 Malignant neoplasm of left kidney, except renal pelvis: Secondary | ICD-10-CM | POA: Diagnosis present

## 2021-10-29 DIAGNOSIS — I509 Heart failure, unspecified: Secondary | ICD-10-CM | POA: Diagnosis not present

## 2021-10-29 DIAGNOSIS — R197 Diarrhea, unspecified: Secondary | ICD-10-CM | POA: Diagnosis present

## 2021-10-29 DIAGNOSIS — Z823 Family history of stroke: Secondary | ICD-10-CM

## 2021-10-29 DIAGNOSIS — N2889 Other specified disorders of kidney and ureter: Secondary | ICD-10-CM

## 2021-10-29 DIAGNOSIS — E139 Other specified diabetes mellitus without complications: Secondary | ICD-10-CM

## 2021-10-29 DIAGNOSIS — Z90411 Acquired partial absence of pancreas: Secondary | ICD-10-CM | POA: Diagnosis not present

## 2021-10-29 DIAGNOSIS — R531 Weakness: Secondary | ICD-10-CM | POA: Diagnosis not present

## 2021-10-29 DIAGNOSIS — M109 Gout, unspecified: Secondary | ICD-10-CM | POA: Diagnosis present

## 2021-10-29 DIAGNOSIS — Z888 Allergy status to other drugs, medicaments and biological substances status: Secondary | ICD-10-CM

## 2021-10-29 DIAGNOSIS — N1339 Other hydronephrosis: Secondary | ICD-10-CM

## 2021-10-29 DIAGNOSIS — Z794 Long term (current) use of insulin: Secondary | ICD-10-CM

## 2021-10-29 DIAGNOSIS — I272 Pulmonary hypertension, unspecified: Secondary | ICD-10-CM | POA: Diagnosis present

## 2021-10-29 DIAGNOSIS — K8681 Exocrine pancreatic insufficiency: Secondary | ICD-10-CM | POA: Diagnosis not present

## 2021-10-29 DIAGNOSIS — Z90410 Acquired total absence of pancreas: Secondary | ICD-10-CM | POA: Diagnosis not present

## 2021-10-29 DIAGNOSIS — Z20822 Contact with and (suspected) exposure to covid-19: Secondary | ICD-10-CM | POA: Diagnosis present

## 2021-10-29 DIAGNOSIS — I959 Hypotension, unspecified: Secondary | ICD-10-CM | POA: Diagnosis present

## 2021-10-29 DIAGNOSIS — E872 Acidosis, unspecified: Secondary | ICD-10-CM | POA: Diagnosis present

## 2021-10-29 DIAGNOSIS — Z79899 Other long term (current) drug therapy: Secondary | ICD-10-CM | POA: Diagnosis not present

## 2021-10-29 DIAGNOSIS — B955 Unspecified streptococcus as the cause of diseases classified elsewhere: Secondary | ICD-10-CM

## 2021-10-29 DIAGNOSIS — M898X9 Other specified disorders of bone, unspecified site: Secondary | ICD-10-CM | POA: Diagnosis present

## 2021-10-29 DIAGNOSIS — N131 Hydronephrosis with ureteral stricture, not elsewhere classified: Secondary | ICD-10-CM | POA: Diagnosis present

## 2021-10-29 DIAGNOSIS — F419 Anxiety disorder, unspecified: Secondary | ICD-10-CM | POA: Diagnosis present

## 2021-10-29 DIAGNOSIS — A401 Sepsis due to streptococcus, group B: Secondary | ICD-10-CM | POA: Diagnosis not present

## 2021-10-29 DIAGNOSIS — K8689 Other specified diseases of pancreas: Secondary | ICD-10-CM | POA: Diagnosis present

## 2021-10-29 DIAGNOSIS — Z8249 Family history of ischemic heart disease and other diseases of the circulatory system: Secondary | ICD-10-CM

## 2021-10-29 DIAGNOSIS — Z9049 Acquired absence of other specified parts of digestive tract: Secondary | ICD-10-CM | POA: Diagnosis not present

## 2021-10-29 DIAGNOSIS — E1122 Type 2 diabetes mellitus with diabetic chronic kidney disease: Secondary | ICD-10-CM | POA: Diagnosis not present

## 2021-10-29 DIAGNOSIS — G4730 Sleep apnea, unspecified: Secondary | ICD-10-CM | POA: Diagnosis not present

## 2021-10-29 DIAGNOSIS — Z87442 Personal history of urinary calculi: Secondary | ICD-10-CM

## 2021-10-29 DIAGNOSIS — Z881 Allergy status to other antibiotic agents status: Secondary | ICD-10-CM

## 2021-10-29 DIAGNOSIS — R6520 Severe sepsis without septic shock: Secondary | ICD-10-CM | POA: Diagnosis present

## 2021-10-29 DIAGNOSIS — N186 End stage renal disease: Secondary | ICD-10-CM

## 2021-10-29 DIAGNOSIS — R509 Fever, unspecified: Secondary | ICD-10-CM | POA: Diagnosis present

## 2021-10-29 DIAGNOSIS — E891 Postprocedural hypoinsulinemia: Secondary | ICD-10-CM | POA: Diagnosis present

## 2021-10-29 DIAGNOSIS — R7881 Bacteremia: Secondary | ICD-10-CM | POA: Diagnosis not present

## 2021-10-29 DIAGNOSIS — G4700 Insomnia, unspecified: Secondary | ICD-10-CM | POA: Diagnosis present

## 2021-10-29 DIAGNOSIS — N368 Other specified disorders of urethra: Secondary | ICD-10-CM | POA: Diagnosis present

## 2021-10-29 DIAGNOSIS — E1300 Other specified diabetes mellitus with hyperosmolarity without nonketotic hyperglycemic-hyperosmolar coma (NKHHC): Secondary | ICD-10-CM | POA: Diagnosis not present

## 2021-10-29 DIAGNOSIS — A491 Streptococcal infection, unspecified site: Secondary | ICD-10-CM | POA: Diagnosis not present

## 2021-10-29 DIAGNOSIS — N2581 Secondary hyperparathyroidism of renal origin: Secondary | ICD-10-CM | POA: Diagnosis present

## 2021-10-29 DIAGNOSIS — E559 Vitamin D deficiency, unspecified: Secondary | ICD-10-CM | POA: Diagnosis present

## 2021-10-29 DIAGNOSIS — Z800 Family history of malignant neoplasm of digestive organs: Secondary | ICD-10-CM

## 2021-10-29 DIAGNOSIS — N151 Renal and perinephric abscess: Secondary | ICD-10-CM

## 2021-10-29 DIAGNOSIS — D72829 Elevated white blood cell count, unspecified: Secondary | ICD-10-CM | POA: Diagnosis present

## 2021-10-29 DIAGNOSIS — R011 Cardiac murmur, unspecified: Secondary | ICD-10-CM | POA: Diagnosis present

## 2021-10-29 DIAGNOSIS — C189 Malignant neoplasm of colon, unspecified: Secondary | ICD-10-CM | POA: Diagnosis not present

## 2021-10-29 DIAGNOSIS — I480 Paroxysmal atrial fibrillation: Secondary | ICD-10-CM | POA: Diagnosis present

## 2021-10-29 DIAGNOSIS — Z8673 Personal history of transient ischemic attack (TIA), and cerebral infarction without residual deficits: Secondary | ICD-10-CM

## 2021-10-29 DIAGNOSIS — Z820 Family history of epilepsy and other diseases of the nervous system: Secondary | ICD-10-CM

## 2021-10-29 DIAGNOSIS — R6510 Systemic inflammatory response syndrome (SIRS) of non-infectious origin without acute organ dysfunction: Secondary | ICD-10-CM | POA: Diagnosis not present

## 2021-10-29 DIAGNOSIS — R6511 Systemic inflammatory response syndrome (SIRS) of non-infectious origin with acute organ dysfunction: Secondary | ICD-10-CM | POA: Diagnosis not present

## 2021-10-29 DIAGNOSIS — R32 Unspecified urinary incontinence: Secondary | ICD-10-CM | POA: Diagnosis not present

## 2021-10-29 DIAGNOSIS — N135 Crossing vessel and stricture of ureter without hydronephrosis: Secondary | ICD-10-CM | POA: Diagnosis not present

## 2021-10-29 DIAGNOSIS — Z885 Allergy status to narcotic agent status: Secondary | ICD-10-CM

## 2021-10-29 DIAGNOSIS — I5032 Chronic diastolic (congestive) heart failure: Secondary | ICD-10-CM | POA: Diagnosis present

## 2021-10-29 DIAGNOSIS — N1330 Unspecified hydronephrosis: Secondary | ICD-10-CM | POA: Diagnosis present

## 2021-10-29 DIAGNOSIS — K219 Gastro-esophageal reflux disease without esophagitis: Secondary | ICD-10-CM | POA: Diagnosis present

## 2021-10-29 DIAGNOSIS — D631 Anemia in chronic kidney disease: Secondary | ICD-10-CM | POA: Diagnosis present

## 2021-10-29 DIAGNOSIS — Z992 Dependence on renal dialysis: Secondary | ICD-10-CM | POA: Diagnosis not present

## 2021-10-29 DIAGNOSIS — I132 Hypertensive heart and chronic kidney disease with heart failure and with stage 5 chronic kidney disease, or end stage renal disease: Secondary | ICD-10-CM | POA: Diagnosis present

## 2021-10-29 DIAGNOSIS — Z91018 Allergy to other foods: Secondary | ICD-10-CM

## 2021-10-29 DIAGNOSIS — R7989 Other specified abnormal findings of blood chemistry: Secondary | ICD-10-CM | POA: Diagnosis present

## 2021-10-29 DIAGNOSIS — A419 Sepsis, unspecified organism: Secondary | ICD-10-CM | POA: Diagnosis present

## 2021-10-29 DIAGNOSIS — Z90710 Acquired absence of both cervix and uterus: Secondary | ICD-10-CM

## 2021-10-29 DIAGNOSIS — M79643 Pain in unspecified hand: Secondary | ICD-10-CM | POA: Diagnosis present

## 2021-10-29 DIAGNOSIS — N132 Hydronephrosis with renal and ureteral calculous obstruction: Secondary | ICD-10-CM | POA: Diagnosis not present

## 2021-10-29 DIAGNOSIS — N281 Cyst of kidney, acquired: Secondary | ICD-10-CM | POA: Diagnosis not present

## 2021-10-29 DIAGNOSIS — M545 Low back pain, unspecified: Secondary | ICD-10-CM | POA: Diagnosis present

## 2021-10-29 DIAGNOSIS — Z7901 Long term (current) use of anticoagulants: Secondary | ICD-10-CM

## 2021-10-29 DIAGNOSIS — I2510 Atherosclerotic heart disease of native coronary artery without angina pectoris: Secondary | ICD-10-CM | POA: Diagnosis present

## 2021-10-29 DIAGNOSIS — N250 Renal osteodystrophy: Secondary | ICD-10-CM | POA: Diagnosis not present

## 2021-10-29 DIAGNOSIS — I120 Hypertensive chronic kidney disease with stage 5 chronic kidney disease or end stage renal disease: Secondary | ICD-10-CM | POA: Diagnosis not present

## 2021-10-29 DIAGNOSIS — R0682 Tachypnea, not elsewhere classified: Secondary | ICD-10-CM | POA: Diagnosis present

## 2021-10-29 DIAGNOSIS — L299 Pruritus, unspecified: Secondary | ICD-10-CM | POA: Diagnosis not present

## 2021-10-29 DIAGNOSIS — K439 Ventral hernia without obstruction or gangrene: Secondary | ICD-10-CM | POA: Diagnosis not present

## 2021-10-29 DIAGNOSIS — E109 Type 1 diabetes mellitus without complications: Secondary | ICD-10-CM | POA: Diagnosis not present

## 2021-10-29 DIAGNOSIS — M1990 Unspecified osteoarthritis, unspecified site: Secondary | ICD-10-CM | POA: Diagnosis present

## 2021-10-29 DIAGNOSIS — Z808 Family history of malignant neoplasm of other organs or systems: Secondary | ICD-10-CM

## 2021-10-29 DIAGNOSIS — T82590A Other mechanical complication of surgically created arteriovenous fistula, initial encounter: Secondary | ICD-10-CM | POA: Diagnosis not present

## 2021-10-29 DIAGNOSIS — A408 Other streptococcal sepsis: Principal | ICD-10-CM | POA: Diagnosis present

## 2021-10-29 DIAGNOSIS — K651 Peritoneal abscess: Secondary | ICD-10-CM | POA: Diagnosis not present

## 2021-10-29 DIAGNOSIS — Z9862 Peripheral vascular angioplasty status: Secondary | ICD-10-CM

## 2021-10-29 DIAGNOSIS — R319 Hematuria, unspecified: Secondary | ICD-10-CM | POA: Diagnosis present

## 2021-10-29 DIAGNOSIS — R519 Headache, unspecified: Secondary | ICD-10-CM | POA: Diagnosis present

## 2021-10-29 DIAGNOSIS — R5381 Other malaise: Secondary | ICD-10-CM | POA: Diagnosis present

## 2021-10-29 DIAGNOSIS — D136 Benign neoplasm of pancreas: Secondary | ICD-10-CM | POA: Diagnosis present

## 2021-10-29 DIAGNOSIS — R918 Other nonspecific abnormal finding of lung field: Secondary | ICD-10-CM | POA: Diagnosis not present

## 2021-10-29 DIAGNOSIS — Z85528 Personal history of other malignant neoplasm of kidney: Secondary | ICD-10-CM | POA: Diagnosis not present

## 2021-10-29 DIAGNOSIS — N130 Hydronephrosis with ureteropelvic junction obstruction: Secondary | ICD-10-CM | POA: Diagnosis not present

## 2021-10-29 LAB — COMPREHENSIVE METABOLIC PANEL
ALT: 19 U/L (ref 0–44)
AST: 26 U/L (ref 15–41)
Albumin: 2.3 g/dL — ABNORMAL LOW (ref 3.5–5.0)
Alkaline Phosphatase: 67 U/L (ref 38–126)
Anion gap: 12 (ref 5–15)
BUN: 20 mg/dL (ref 8–23)
CO2: 28 mmol/L (ref 22–32)
Calcium: 8.6 mg/dL — ABNORMAL LOW (ref 8.9–10.3)
Chloride: 97 mmol/L — ABNORMAL LOW (ref 98–111)
Creatinine, Ser: 3.38 mg/dL — ABNORMAL HIGH (ref 0.44–1.00)
GFR, Estimated: 13 mL/min — ABNORMAL LOW (ref >60)
Glucose, Bld: 343 mg/dL — ABNORMAL HIGH (ref 70–99)
Potassium: 4.1 mmol/L (ref 3.5–5.1)
Sodium: 137 mmol/L (ref 135–145)
Total Bilirubin: 0.6 mg/dL (ref 0.3–1.2)
Total Protein: 6.6 g/dL (ref 6.5–8.1)

## 2021-10-29 LAB — URINALYSIS, ROUTINE W REFLEX MICROSCOPIC
Bilirubin Urine: NEGATIVE
Glucose, UA: NEGATIVE mg/dL
Ketones, ur: NEGATIVE mg/dL
Leukocytes,Ua: NEGATIVE
Nitrite: NEGATIVE
Protein, ur: >=300 mg/dL — AB
Specific Gravity, Urine: 1.015 (ref 1.005–1.030)
pH: 7 (ref 5.0–8.0)

## 2021-10-29 LAB — CBC WITH DIFFERENTIAL/PLATELET
Abs Immature Granulocytes: 0.07 10*3/uL (ref 0.00–0.07)
Basophils Absolute: 0 10*3/uL (ref 0.0–0.1)
Basophils Relative: 0 %
Eosinophils Absolute: 0 10*3/uL (ref 0.0–0.5)
Eosinophils Relative: 0 %
HCT: 28.3 % — ABNORMAL LOW (ref 36.0–46.0)
Hemoglobin: 8.4 g/dL — ABNORMAL LOW (ref 12.0–15.0)
Immature Granulocytes: 1 %
Lymphocytes Relative: 6 %
Lymphs Abs: 0.8 10*3/uL (ref 0.7–4.0)
MCH: 27.6 pg (ref 26.0–34.0)
MCHC: 29.7 g/dL — ABNORMAL LOW (ref 30.0–36.0)
MCV: 93.1 fL (ref 80.0–100.0)
Monocytes Absolute: 0.3 10*3/uL (ref 0.1–1.0)
Monocytes Relative: 3 %
Neutro Abs: 12 10*3/uL — ABNORMAL HIGH (ref 1.7–7.7)
Neutrophils Relative %: 90 %
Platelets: 341 10*3/uL (ref 150–400)
RBC: 3.04 MIL/uL — ABNORMAL LOW (ref 3.87–5.11)
RDW: 17.2 % — ABNORMAL HIGH (ref 11.5–15.5)
WBC: 13.2 10*3/uL — ABNORMAL HIGH (ref 4.0–10.5)
nRBC: 0 % (ref 0.0–0.2)

## 2021-10-29 LAB — LACTIC ACID, PLASMA
Lactic Acid, Venous: 3.6 mmol/L (ref 0.5–1.9)
Lactic Acid, Venous: 2.2 mmol/L (ref 0.5–1.9)

## 2021-10-29 LAB — PROTIME-INR
INR: 1.4 — ABNORMAL HIGH (ref 0.8–1.2)
Prothrombin Time: 17.3 seconds — ABNORMAL HIGH (ref 11.4–15.2)

## 2021-10-29 LAB — SARS CORONAVIRUS 2 BY RT PCR: SARS Coronavirus 2 by RT PCR: NEGATIVE

## 2021-10-29 MED ORDER — ONDANSETRON HCL 4 MG/2ML IJ SOLN: 4.0000 mg | Freq: Four times a day (QID) | INTRAMUSCULAR | Status: DC | PRN

## 2021-10-29 MED ORDER — HEPARIN SODIUM (PORCINE) 5000 UNIT/ML IJ SOLN: 5000.0000 [IU] | Freq: Three times a day (TID) | INTRAMUSCULAR | Status: DC

## 2021-10-29 MED ORDER — CARVEDILOL 3.125 MG PO TABS
3.1250 mg | ORAL_TABLET | Freq: Two times a day (BID) | ORAL | Status: DC
  Administered 2021-10-30 – 2021-11-04 (×12): 3.125 mg via ORAL
  Filled 2021-10-29 (×13): qty 1

## 2021-10-29 MED ORDER — APIXABAN 2.5 MG PO TABS
2.5000 mg | ORAL_TABLET | Freq: Two times a day (BID) | ORAL | Status: DC
  Administered 2021-10-30 (×2): 2.5 mg via ORAL
  Filled 2021-10-29 (×4): qty 1

## 2021-10-29 MED ORDER — COLESTIPOL HCL 1 G PO TABS
1.0000 g | ORAL_TABLET | ORAL | Status: DC
  Administered 2021-10-31 – 2021-11-02 (×2): 1 g via ORAL
  Filled 2021-10-29 (×2): qty 1

## 2021-10-29 MED ORDER — PIPERACILLIN-TAZOBACTAM 3.375 G IVPB 30 MIN
3.3750 g | Freq: Once | INTRAVENOUS | Status: AC
  Administered 2021-10-29: 3.375 g via INTRAVENOUS
  Filled 2021-10-29: qty 50

## 2021-10-29 MED ORDER — ACETAMINOPHEN 650 MG RE SUPP: 650.0000 mg | Freq: Four times a day (QID) | RECTAL | Status: DC | PRN

## 2021-10-29 MED ORDER — POLYETHYLENE GLYCOL 3350 17 G PO PACK
17.0000 g | PACK | Freq: Every day | ORAL | Status: DC | PRN
  Administered 2021-11-04: 17 g via ORAL
  Filled 2021-10-29: qty 1

## 2021-10-29 MED ORDER — LIFITEGRAST 5 % OP SOLN
1.0000 [drp] | Freq: Every day | OPHTHALMIC | Status: DC
  Administered 2021-10-31 – 2021-11-03 (×4): 1 [drp] via OPHTHALMIC

## 2021-10-29 MED ORDER — ONDANSETRON HCL 4 MG PO TABS: 4.0000 mg | ORAL_TABLET | Freq: Four times a day (QID) | ORAL | Status: DC | PRN

## 2021-10-29 MED ORDER — ACETAMINOPHEN 325 MG PO TABS
650.0000 mg | ORAL_TABLET | Freq: Four times a day (QID) | ORAL | Status: DC | PRN
  Administered 2021-11-03: 650 mg via ORAL
  Filled 2021-10-29: qty 2

## 2021-10-29 MED ORDER — PIPERACILLIN-TAZOBACTAM IN DEX 2-0.25 GM/50ML IV SOLN
2.2500 g | Freq: Three times a day (TID) | INTRAVENOUS | Status: DC
  Filled 2021-10-29: qty 50

## 2021-10-29 MED ORDER — SEVELAMER CARBONATE 800 MG PO TABS
1600.0000 mg | ORAL_TABLET | Freq: Three times a day (TID) | ORAL | Status: DC
  Administered 2021-10-30 – 2021-11-04 (×13): 1600 mg via ORAL
  Filled 2021-10-29 (×13): qty 2

## 2021-10-29 MED ORDER — AMIODARONE HCL 200 MG PO TABS
200.0000 mg | ORAL_TABLET | Freq: Every day | ORAL | Status: DC
  Administered 2021-10-30 – 2021-11-04 (×6): 200 mg via ORAL
  Filled 2021-10-29 (×7): qty 1

## 2021-10-29 MED ORDER — PANCRELIPASE (LIP-PROT-AMYL) 36000-114000 UNITS PO CPEP
72000.0000 [IU] | ORAL_CAPSULE | Freq: Three times a day (TID) | ORAL | Status: DC
  Administered 2021-10-30 – 2021-11-04 (×13): 72000 [IU] via ORAL
  Filled 2021-10-29 (×14): qty 2

## 2021-10-29 MED ORDER — PANCRELIPASE (LIP-PROT-AMYL) 36000-114000 UNITS PO CPEP
36000.0000 [IU] | ORAL_CAPSULE | ORAL | Status: DC
  Administered 2021-11-01 – 2021-11-03 (×2): 36000 [IU] via ORAL
  Filled 2021-10-29 (×5): qty 1

## 2021-10-29 MED ORDER — PIPERACILLIN-TAZOBACTAM 3.375 G IVPB: 3.3750 g | Freq: Three times a day (TID) | INTRAVENOUS | Status: DC

## 2021-10-29 MED ORDER — MIDODRINE HCL 5 MG PO TABS
10.0000 mg | ORAL_TABLET | ORAL | Status: DC
  Administered 2021-11-02: 10 mg via ORAL
  Filled 2021-10-29 (×2): qty 2

## 2021-10-29 MED ORDER — INSULIN ASPART 100 UNIT/ML IJ SOLN
0.0000 [IU] | Freq: Three times a day (TID) | INTRAMUSCULAR | Status: DC
  Administered 2021-10-30 (×3): 1 [IU] via SUBCUTANEOUS
  Administered 2021-10-30: 3 [IU] via SUBCUTANEOUS
  Administered 2021-10-31: 1 [IU] via SUBCUTANEOUS
  Administered 2021-11-01 – 2021-11-02 (×3): 2 [IU] via SUBCUTANEOUS
  Administered 2021-11-02 – 2021-11-03 (×2): 1 [IU] via SUBCUTANEOUS
  Administered 2021-11-03: 2 [IU] via SUBCUTANEOUS
  Administered 2021-11-03 – 2021-11-04 (×4): 1 [IU] via SUBCUTANEOUS

## 2021-10-29 MED ORDER — SODIUM CHLORIDE 0.9% FLUSH
3.0000 mL | Freq: Two times a day (BID) | INTRAVENOUS | Status: DC
  Administered 2021-10-30 – 2021-11-04 (×12): 3 mL via INTRAVENOUS

## 2021-10-29 MED ORDER — VANCOMYCIN HCL IN DEXTROSE 1-5 GM/200ML-% IV SOLN
1000.0000 mg | Freq: Once | INTRAVENOUS | Status: AC
  Administered 2021-10-29: 1000 mg via INTRAVENOUS
  Filled 2021-10-29: qty 200

## 2021-10-29 NOTE — ED Notes (Signed)
Per ED provider, postpone lactic re-draw until antibiotics are complete.

## 2021-10-29 NOTE — H&P (Signed)
History and Physical    Patient: Kim Burns MRN: [id number] DOA: 10/29/2021  Date of Service: the patient was seen and examined on 10/30/2021  Patient coming from: Home  Chief Complaint:  Chief Complaint  Patient presents with   Abnormal Lab    HPI:   86 year old female with past medical history of end-stage renal disease (Started HD 02/2021 now MWF), paroxysmal atrial fibrillation (on Eliquis), diastolic congestive heart failure (Echo 08/2021 with EF 60-65%, G3DD) coronary artery disease (cath 08/2021 with nonobstructive disease), prior stroke, left renal cell carcinoma, prior Whipple procedure with secondary insulin-dependent diabetes mellitus, hypertension presenting to [facility] emergency department with complaints of several weeks of fever and weakness.  Of note, concerning patient's renal cell carcinoma of the left kidney patient underwent an ablation procedure by interventional radiology at the recommendation of urology 08/22/2021.  Patient was admitted to the hospital twice in July after that procedure due to recurrent atrial fibrillation with rapid ventricular response.  Patient was found to have a leukocytosis at that time and prompting empiric antibiotic therapy but infectious work-ups were negative.  Patient explains that for approximate the past 3 to 4 weeks she has been experiencing intermittent low-grade fevers with temperatures typically around 100 F.  Over the course of the past 4 weeks patient has developed progressively worsening generalized weakness and lethargy.  Patient denies any cough, shortness of breath, dysuria, abdominal pain, diarrhea, sick contacts, recent travel or contact with confirmed COVID-19 infection.  With progressively worsening symptoms patient went for hemodialysis on 9/10 and after dialysis was so weak when she came home she nearly collapsed.  She was so exhausted that she was unable to get up.  This prompted her to present to [facility] emergency department for evaluation today.  Upon evaluation in the emergency department CT imaging of the abdomen and pelvis there was apparent interval development of post ablation changes of the left kidney with additional interval development of severe hydronephrosis with obstruction of the proximal ureter.  Patient was additionally found to have a lactic acidosis and leukocytosis and therefore the patient was placed on empiric intravenous antibiotic therapies by the EDP with vancomycin and Zosyn.  EDP discussed case with Dr. Johnney Ou with nephrology who agreed to see the patient in consultation.  EDP additionally discussed the case with Dr. Cain Sieve with urology who also agreed to see the patient in consultation.  The hospitalist group was then called to assess the patient for admission to the hospital.  Review of Systems: Review of Systems  Constitutional:  Positive for fever and malaise/fatigue.  Neurological:  Positive for weakness.  All other systems reviewed and are negative.    Past Medical History:  Diagnosis Date   Anemia of chronic disease    takes iron   Anxiety    Blood transfusion without reported diagnosis    Bradycardia    Diabetes mellitus without complication ([place])    became diabetic after Whipple procedure   Diarrhea    Dysrhythmia 04/16/2016   bradycardia due to medication    ESRD on hemodialysis [facility])    M-W-F   GERD (gastroesophageal reflux disease)    Gout    Headache    History of kidney stones 06/2013   Hyperparathyroidism ([place])    Hypertension    PONV (postoperative nausea and vomiting)    Sleep apnea    no cpap machine. could not tolerate   Stroke ([place]) 04/16/2016   TIA 1995   Vitamin D deficiency  Past Surgical History:  Procedure Laterality Date   A/V FISTULAGRAM Left 08/30/2021   Procedure: A/V Fistulagram;  Surgeon: Marty Heck, MD;  Location: [place] [facility];  Service: Cardiovascular;  Laterality: Left;   ABDOMINAL  HYSTERECTOMY  1985   complete   BACK SURGERY  8938   lower   BASCILIC VEIN TRANSPOSITION Left 04/24/2017   Procedure: LEFT ARM FIRST STAGE BASILIC VEIN TRANSPOSITION;  Surgeon: Serafina Mitchell, MD;  Location: [place];  Service: Vascular;  Laterality: Left;   [place] Left 07/10/2017   Procedure: SECOND STAGE BASILIC VEIN TRANSPOSITION LEFT ARM;  Surgeon: Serafina Mitchell, MD;  Location: [facility];  Service: Vascular;  Laterality: Left;   BREAST LUMPECTOMY Left x 2   many years apart, benign   CHOLECYSTECTOMY  1985   CYSTOSCOPY WITH URETEROSCOPY AND STENT PLACEMENT Left 06/18/2013   Procedure: CYSTOSCOPY WITH Lef URETEROSCOPY AND Left STENT PLACEMENT;  Surgeon: Dutch Gray, MD;  Location: [facility];  Service: Urology;  Laterality: Left;   ESOPHAGOGASTRODUODENOSCOPY (EGD) WITH PROPOFOL N/A 11/13/2016   Procedure: ESOPHAGOGASTRODUODENOSCOPY (EGD) WITH PROPOFOL;  Surgeon: Otis Brace, MD;  Location: [place];  Service: Gastroenterology;  Laterality: N/A;   EUS N/A 02/07/2016   Procedure: ESOPHAGEAL ENDOSCOPIC ULTRASOUND (EUS) RADIAL;  Surgeon: Arta Silence, MD;  [facility]: [facility];  Service: Endoscopy;  Laterality: N/A;   EYE SURGERY Bilateral 2014   ioc for catracts    FOOT SURGERY Left 1990   something with toes unsure what    HERNIA REPAIR  2018   IR EMBO TUMOR ORGAN ISCHEMIA INFARCT INC GUIDE ROADMAPPING  08/22/2021   IR RADIOLOGIST EVAL & MGMT  05/18/2021   IR RADIOLOGIST EVAL & MGMT  10/01/2021   IR RENAL SUPRASEL UNI S&I MOD SED  08/22/2021   IR US GUIDE VASC ACCESS RIGHT  08/22/2021   LEFT HEART CATH AND CORONARY ANGIOGRAPHY N/A 08/27/2021   Procedure: LEFT HEART CATH AND CORONARY ANGIOGRAPHY;  Surgeon: Burnell Blanks, MD;  Location: [place] [facility];  Service: Cardiovascular;  Laterality: N/A;   PARATHYROID EXPLORATION     PERIPHERAL VASCULAR BALLOON ANGIOPLASTY Left 08/30/2021   Procedure: PERIPHERAL VASCULAR BALLOON ANGIOPLASTY;  Surgeon: Marty Heck, MD;  Location: [place] [facility];  Service: Cardiovascular;  Laterality: Left;  arm fistula   RADIOLOGY WITH ANESTHESIA Left 08/22/2021   Procedure: MICROWAVE ABLATION;  Surgeon: Sandi Mariscal, MD;  Location: [facility];  Service: Anesthesiology;  Laterality: Left;   WHIPPLE PROCEDURE N/A 04/23/2016   Procedure: WHIPPLE PROCEDURE;  Surgeon: Stark Klein, MD;  Location: [place];  Service: General;  Laterality: N/A;    Social History:  reports that she has never smoked. She has never used smokeless tobacco. She reports that she does not drink alcohol and does not use drugs.  Allergies  Allergen Reactions   Lopid [Gemfibrozil] Other (See Comments)    Myalgia    Lotemax [Loteprednol Etabonate] Rash    Skin burning    Statins Other (See Comments)    Muscle weakness   Norco [Hydrocodone-Acetaminophen] Other (See Comments)    Headaches  Tolerates acetaminophen    Other Diarrhea    Real Butter - diarrhea   Tomato Other (See Comments)    Belching    Vibra-Tab [Doxycycline] Other (See Comments)    Unknown reaction   Codeine Other (See Comments)    headache   Hydrocodone-Acetaminophen Other (See Comments)    Headache. Tolerates acetaminophen.    Family History  Problem Relation  Age of Onset   Heart disease Mother    Stroke Mother    Cancer Father        colon   Alzheimer's disease Sister    Alzheimer's disease Sister    Cancer Brother        brain   Breast cancer Neg Hx     Prior to Admission medications   Medication Sig Start Date End Date Taking? Authorizing Provider  acetaminophen (TYLENOL 8 HOUR ARTHRITIS PAIN) 650 MG CR tablet Take 1,300 mg by mouth daily as needed for pain.   Yes [provider]  amiodarone (PACERONE) 200 MG tablet Take 1 tablet (200 mg total) by mouth daily. Please keep appointment for further refills 10/01/21  Yes Martinique, Peter M, MD  apixaban (ELIQUIS) 2.5 MG TABS tablet Take 1 tablet (2.5 mg total) by mouth 2 (two) times daily. 09/25/21  03/24/22 Yes Martinique, Peter M, MD  carvedilol (COREG) 3.125 MG tablet Take 3.125 mg by mouth 2 (two) times daily. 10/08/21  Yes [provider]  Cholecalciferol (VITAMIN D-3 PO) Take 1 capsule by mouth every Monday, Wednesday, and Friday with hemodialysis.   Yes [provider]  colestipol (COLESTID) 1 g tablet Take 1 g by mouth See admin instructions. 1 tablet (1g) prior to dialysis M-W-F + 1 tablet daily PRN diarrhea   Yes [provider]  diclofenac sodium (VOLTAREN) 1 % GEL Apply 2 g topically 4 (four) times daily as needed (for hand pain).    Yes [provider]  diphenhydramine-acetaminophen (TYLENOL PM) 25-500 MG TABS tablet Take 1 tablet by mouth at bedtime as needed (sleep, pain).   Yes [provider]  insulin aspart (NOVOLOG FLEXPEN) 100 UNIT/ML FlexPen Inject 10 Units into the skin 3 (three) times daily as needed (CBG > 200).   Yes [provider]  lidocaine-prilocaine (EMLA) cream Apply 1 Application topically every Monday, Wednesday, and Friday with hemodialysis.   Yes [provider]  lipase/protease/amylase (CREON) 36000 UNITS CPEP capsule Take 1 capsule (36,000 Units total) by mouth with snacks. Patient taking differently: Take 36,000-72,000 Units by mouth See admin instructions. 2 capsules(72000 units) TID with meals AND 1 capsule(36000 units) with snacks 08/30/21 11/28/21 Yes Chatterjee, Kyra Searles, MD  midodrine (PROAMATINE) 10 MG tablet Take 10 mg by mouth as directed. 09/27/21  Yes [provider]  sevelamer carbonate (RENVELA) 800 MG tablet Take 1,600 mg by mouth 3 (three) times daily with meals.   Yes [provider]  XIIDRA 5 % SOLN Place 1 drop into both eyes at bedtime. 09/24/21  Yes [provider]    Physical Exam:  Vitals:   10/29/21 2300 10/29/21 2335 10/30/21 0010 10/30/21 0648  BP: 135/89  128/64 130/66  Pulse: 79  70 73  Resp: (!) '23  18 18  '$ Temp:  99.2 F (37.3 C) 98.4 F  (36.9 C) 98.4 F (36.9 C)  TempSrc:  Oral  Oral  SpO2: 100%  100% 100%  Weight:      Height:        Constitutional: Awake alert and oriented x3, no associated distress.   Skin: no rashes, no lesions, poor skin turgor noted. Eyes: Pupils are equally reactive to light.  No evidence of scleral icterus or conjunctival pallor.  ENMT: Slightly dry mucous membranes noted.  Posterior pharynx clear of any exudate or lesions.   Neck: normal, supple, no masses, no thyromegaly.  No evidence of jugular venous distension.   Respiratory: clear to auscultation bilaterally, no  wheezing, no crackles. Normal respiratory effort. No accessory muscle use.  Cardiovascular: Regular rate and rhythm, no murmurs / rubs / gallops. No extremity edema. 2+ pedal pulses. No carotid bruits.  Chest:   Nontender without crepitus or deformity.   Back:   Nontender without crepitus or deformity. Abdomen: Abdomen is soft and nontender.  No evidence of intra-abdominal masses.  Positive bowel sounds noted in all quadrants.   Musculoskeletal: No joint deformity upper and lower extremities. Good ROM, no contractures. Normal muscle tone.  Neurologic: CN 2-12 grossly intact. Sensation intact.  Patient moving all 4 extremities spontaneously.  Patient is following all commands.  Patient is responsive to verbal stimuli.   Psychiatric: Patient exhibits normal mood with appropriate affect.  Patient seems to possess insight as to their current situation.    Data Reviewed:  I have personally reviewed and interpreted labs, imaging.  Significant findings are   Lab Results  Component Value Date   WBC 11.5 (H) 10/30/2021   HGB 8.0 (L) 10/30/2021   HCT 26.3 (L) 10/30/2021   MCV 91.0 10/30/2021   PLT 326 10/30/2021   Lab Results  Component Value Date   K 4.2 10/30/2021   Lab Results  Component Value Date   BUN 29 (H) 10/30/2021   Lab Results  Component Value Date   CREATININE 4.11 (H) 10/30/2021    CXR:   Chest X-ray was  personally reviewed.  No evidence of focal infiltrates.  No evidence of pleural effusion.  No evidence of pneumothorax.    EKG: Personally reviewed.  Rhythm is normal sinus rhythm with heart rate of 74 bpm.  No dynamic ST segment changes appreciated.   Assessment and Plan: * SIRS (systemic inflammatory response syndrome) (HCC) Patient exhibiting leukocytosis and intermittent tachypnea in the emergency room  Patient also exhibiting a lactic acidosis Patient also reporting several weeks of low-grade temperatures of approximately 100 F Unclear etiology of patient's SIRS.  It is worth noting that patient had leukocytosis back in July as well.  While patient was empirically given antibiotics initially cultures remain negative and therefore the course of antibiotics was not completed. This time, patient states that she is symptomatically worsening with intense generalized malaise and weakness and inability to perform ADLs Furthermore, patient seems to have developed severe left-sided hydronephrosis but no obvious evidence of pyelonephritis and I am uncertain as to how this would play into the patient's symptoms.  Patient has no flank pain or tenderness on exam Chest x-ray is clear, urinalysis is unremarkable Procalcitonin is somewhat elevated however. We will keep patient on empiric antibiotic therapy for now since patient's procalcitonin is elevated If blood cultures remain negative for 48 hours then antibiotics can likely be discontinued once again If no definitive evidence of infection can be identified perhaps the low-grade temperatures are from the tumor itself?  Hydronephrosis of left kidney Uncertain as to if the development of this patient's hydronephrosis is clinically relevant as there is no evidence of associated pyelonephritis on CT, an unremarkable urinalysis and the patient has no symptoms of flank pain or tenderness on exam Please see notes above Dr. Cain Sieve with urology is evaluating  the patient in consultation, their input is appreciated.  Lactic acidosis Notable lactic acidosis, possibly secondary to underlying infection Avoiding aggressive intravenous volume resuscitation due to end-stage renal disease Performing serial lactic acid levels to ensure downtrending and resolution  Renal cell carcinoma of left kidney ([place]) Conservatively managed with IR embolization of the renal mass 08/22/2021 Appreciate urology's input  on what next best steps would be, if any, in managing this patient's renal cell carcinoma  Paroxysmal atrial fibrillation (HCC) Currently rate controlled. Continue home regimen of anticoagulation Continue home regimen of rate controlling agent Monitoring on telemetry   ESRD (end stage renal disease) on dialysis [facility]) Receives hemodialysis Monday Wednesday Friday We will consult nephrology for resumption of hemodialysis while hospitalized  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 [facility]) Patient been placed on Accu-Cheks before every meal and nightly with sliding scale insulin Hemoglobin A1C ordered    Chronic diastolic CHF (congestive heart failure) ([place]) No clinical evidence of cardiogenic volume overload Strict input and output monitoring Daily weights Low-sodium diet        Code Status:  Full code  code status decision has been confirmed with: patient Family Communication: deferred   Consults: Dr. Cain Sieve with Urology, Dr. Joelyn Oms with Nephrology  Severity of Illness:  The appropriate patient status for this patient is INPATIENT. Inpatient status is judged to be reasonable and necessary in order to provide the required intensity of service to ensure the patient's safety. The patient's presenting symptoms, physical exam findings, and initial radiographic and laboratory data in the context of their chronic comorbidities is felt to place them at high risk for further clinical deterioration. Furthermore, it is not anticipated that the  patient will be medically stable for discharge from the hospital within 2 midnights of admission.   * I certify that at the point of admission it is my clinical judgment that the patient will require inpatient hospital care spanning beyond 2 midnights from the point of admission due to high intensity of service, high risk for further deterioration and high frequency of surveillance required.*  Author:  Vernelle Emerald MD  10/30/2021 7:23 AM

## 2021-10-29 NOTE — ED Notes (Signed)
Sandwich, crackers and drink given to pt

## 2021-10-29 NOTE — ED Provider Triage Note (Signed)
Emergency Medicine Provider Triage Evaluation Note  Kim Burns , a 86 y.o. female  was evaluated in triage.  Pt was sent here by her PCP, Dr. Isabell Burns for abnormal labs and intermittent fevers for approximately 4 weeks.  Patient is a dialysis patient and had dialysis done today.  Her blood pressure was reportedly low and she was complaining of moderate to severe generalized weakness.  They did make her wait about 45 minutes prior to receiving dialysis and she received an injection to help increase her blood pressure.  Last night, patient reportedly had a temperature of 102.1 F.  Generally, it fluctuates at around 101 F to normal.  She denies abdominal pain, nausea, vomiting, diarrhea, dysuria, hematuria.  However, patient did have a clot in her fistula back in July.  There is concern that she may have another clot causing her current symptoms.  She is scheduled for an ultrasound 11/07/2021.  Fistula was accessed today. Review of Systems  Positive:  Negative:   Physical Exam  BP (!) 106/57 (BP Location: Right Arm)   Pulse 74   Temp 98.3 F (36.8 C) (Oral)   Resp 15   Ht '5\' 2"'$  (1.575 m)   Wt 57.2 kg   SpO2 98%   BMI 23.05 kg/m  Gen:   Awake, no distress   Resp:  Normal effort  MSK:   Moves extremities without difficulty  Other:  Fistula to the left upper extremity without evidence of infection  Medical Decision Making  Medically screening exam initiated at 4:48 PM.  Appropriate orders placed.  Kim Burns was informed that the remainder of the evaluation will be completed by another provider, this initial triage assessment does not replace that evaluation, and the importance of remaining in the ED until their evaluation is complete.     Kim Burns, [place] 10/29/21 1652

## 2021-10-29 NOTE — Telephone Encounter (Signed)
   Telephone encounter was:  Successful.  10/29/2021 Name: CHARMON THORSON MRN: [id number] DOB: [birth year]  [name] is a 86 y.o. year old female who is a primary care patient of Ernie Hew Mechele Claude, MD . The community resource team was consulted for assistance with Transportation Needs   Care guide performed the following interventions: Follow up call placed to the patient to discuss status of referral. CG advised [name] (patient's son) that per manager Melissa, the referral needs to be unassigned from me as she will speak to patient's son further regarding transportation.   Follow Up Plan:  Matilde Bash will follow up with patient/patient's son [name].   [facility] management  [place], [place]  Main Phone: [phone]  E-mail: [email or web address]  Website: [email or web address]

## 2021-10-29 NOTE — Consult Note (Signed)
Urology Consult   Reason for consult: history of renal mass s/p ablation, possible worsening of mass and progression of hydro on the left  History of Present Illness: Kim Burns is a 86 y.o. who presented to the ED c/o a low grade temp over the last month which went up yesterday. She also endorses worsening weakness over this time period.  Kim Burns had a left renal mass embolized and ablated in July. She has ESRD and does HD MWF.  In the ED, her vital signs were stable and she was afebrile. Her white count was 13.2  She had a CT scan done in the ED; there was hydronephrosis on previous scans that appears somewhat worse in the upper pole. At the previous ablation site, there is a ~8 cm mixed density fluid collection, which contains gas.   This morning, she feels better and her white count has trended down slightly.   Past Medical History:  Diagnosis Date   Anemia of chronic disease    takes iron   Anxiety    Blood transfusion without reported diagnosis    Bradycardia    Diabetes mellitus without complication ([place])    became diabetic after Whipple procedure   Diarrhea    Dysrhythmia 04/16/2016   bradycardia due to medication    ESRD on hemodialysis [facility])    M-W-F   GERD (gastroesophageal reflux disease)    Gout    Headache    History of kidney stones 06/2013   Hyperparathyroidism ([place])    Hypertension    PONV (postoperative nausea and vomiting)    Sleep apnea    no cpap machine. could not tolerate   Stroke ([place]) 04/16/2016   TIA 1995   Vitamin D deficiency     Past Surgical History:  Procedure Laterality Date   A/V FISTULAGRAM Left 08/30/2021   Procedure: A/V Fistulagram;  Surgeon: Marty Heck, MD;  Location: [place] [facility];  Service: Cardiovascular;  Laterality: Left;   ABDOMINAL HYSTERECTOMY  1985   complete   BACK SURGERY  4235   lower   BASCILIC VEIN TRANSPOSITION Left 04/24/2017   Procedure: LEFT ARM FIRST STAGE BASILIC VEIN TRANSPOSITION;   Surgeon: Serafina Mitchell, MD;  Location: [place];  Service: Vascular;  Laterality: Left;   [place] Left 07/10/2017   Procedure: SECOND STAGE BASILIC VEIN TRANSPOSITION LEFT ARM;  Surgeon: Serafina Mitchell, MD;  [facility];  Service: Vascular;  Laterality: Left;   BREAST LUMPECTOMY Left x 2   many years apart, benign   CHOLECYSTECTOMY  1985   CYSTOSCOPY WITH URETEROSCOPY AND STENT PLACEMENT Left 06/18/2013   Procedure: CYSTOSCOPY WITH Lef URETEROSCOPY AND Left STENT PLACEMENT;  Surgeon: Dutch Gray, MD;  Location: [facility];  Service: Urology;  Laterality: Left;   ESOPHAGOGASTRODUODENOSCOPY (EGD) WITH PROPOFOL N/A 11/13/2016   Procedure: ESOPHAGOGASTRODUODENOSCOPY (EGD) WITH PROPOFOL;  Surgeon: Otis Brace, MD;  Location: [place];  Service: Gastroenterology;  Laterality: N/A;   EUS N/A 02/07/2016   Procedure: ESOPHAGEAL ENDOSCOPIC ULTRASOUND (EUS) RADIAL;  Surgeon: Arta Silence, MD;  [facility];  Service: Endoscopy;  Laterality: N/A;   EYE SURGERY Bilateral 2014   ioc for catracts    FOOT SURGERY Left 1990   something with toes unsure what    HERNIA REPAIR  2018   IR EMBO TUMOR ORGAN ISCHEMIA INFARCT INC GUIDE ROADMAPPING  08/22/2021   IR RADIOLOGIST EVAL & MGMT  05/18/2021   IR RADIOLOGIST EVAL & MGMT  10/01/2021  IR RENAL SUPRASEL UNI S&I MOD SED  08/22/2021   IR US GUIDE VASC ACCESS RIGHT  08/22/2021   LEFT HEART CATH AND CORONARY ANGIOGRAPHY N/A 08/27/2021   Procedure: LEFT HEART CATH AND CORONARY ANGIOGRAPHY;  Surgeon: Burnell Blanks, MD;  Location: [place] [facility];  Service: Cardiovascular;  Laterality: N/A;   PARATHYROID EXPLORATION     PERIPHERAL VASCULAR BALLOON ANGIOPLASTY Left 08/30/2021   Procedure: PERIPHERAL VASCULAR BALLOON ANGIOPLASTY;  Surgeon: Marty Heck, MD;  Location: [place] [facility];  Service: Cardiovascular;  Laterality: Left;  arm fistula   RADIOLOGY WITH ANESTHESIA Left 08/22/2021   Procedure: MICROWAVE  ABLATION;  Surgeon: Sandi Mariscal, MD;  [facility];  Service: Anesthesiology;  Laterality: Left;   WHIPPLE PROCEDURE N/A 04/23/2016   Procedure: WHIPPLE PROCEDURE;  Surgeon: Stark Klein, MD;  Location: [place];  Service: General;  Laterality: N/A;     Current Hospital Medications:  Home meds:  No current facility-administered medications on file prior to encounter.   Current Outpatient Medications on File Prior to Encounter  Medication Sig Dispense Refill   acetaminophen (TYLENOL 8 HOUR ARTHRITIS PAIN) 650 MG CR tablet Take 1,300 mg by mouth daily as needed for pain.     amiodarone (PACERONE) 200 MG tablet Take 1 tablet (200 mg total) by mouth daily. Please keep appointment for further refills 30 tablet 1   apixaban (ELIQUIS) 2.5 MG TABS tablet Take 1 tablet (2.5 mg total) by mouth 2 (two) times daily. 60 tablet 5   carvedilol (COREG) 3.125 MG tablet Take 3.125 mg by mouth 2 (two) times daily.     Cholecalciferol (VITAMIN D-3 PO) Take 1 capsule by mouth every Monday, Wednesday, and Friday with hemodialysis.     colestipol (COLESTID) 1 g tablet Take 1 g by mouth See admin instructions. 1 tablet (1g) prior to dialysis M-W-F + 1 tablet daily PRN diarrhea     diclofenac sodium (VOLTAREN) 1 % GEL Apply 2 g topically 4 (four) times daily as needed (for hand pain).      diphenhydramine-acetaminophen (TYLENOL PM) 25-500 MG TABS tablet Take 1 tablet by mouth at bedtime as needed (sleep, pain).     insulin aspart (NOVOLOG FLEXPEN) 100 UNIT/ML FlexPen Inject 10 Units into the skin 3 (three) times daily as needed (CBG > 200).     lidocaine-prilocaine (EMLA) cream Apply 1 Application topically every Monday, Wednesday, and Friday with hemodialysis.     lipase/protease/amylase (CREON) 36000 UNITS CPEP capsule Take 1 capsule (36,000 Units total) by mouth with snacks. (Patient taking differently: Take 36,000-72,000 Units by mouth See admin instructions. 2 capsules(72000 units) TID with meals AND 1  capsule(36000 units) with snacks) 270 capsule 0   midodrine (PROAMATINE) 10 MG tablet Take 10 mg by mouth as directed.     sevelamer carbonate (RENVELA) 800 MG tablet Take 1,600 mg by mouth 3 (three) times daily with meals.     XIIDRA 5 % SOLN Place 1 drop into both eyes at bedtime.       Scheduled Meds:  amiodarone  200 mg Oral Daily   apixaban  2.5 mg Oral BID   carvedilol  3.125 mg Oral BID   [START ON 10/31/2021] colestipol  1 g Oral Q M,W,F   insulin aspart  0-6 Units Subcutaneous TID AC & HS   Lifitegrast  1 drop Both Eyes QHS   lipase/protease/amylase  36,000 Units Oral With snacks   lipase/protease/amylase  72,000 Units Oral TID WC   [START ON 10/31/2021]  midodrine  10 mg Oral Q M,W,F-HD   sevelamer carbonate  1,600 mg Oral TID WC   sodium chloride flush  3 mL Intravenous Q12H   Continuous Infusions:  piperacillin-tazobactam (ZOSYN)  IV 2.25 g (10/30/21 0857)   PRN Meds:.  Allergies:  Allergies  Allergen Reactions   Lopid [Gemfibrozil] Other (See Comments)    Myalgia    Lotemax [Loteprednol Etabonate] Rash    Skin burning    Statins Other (See Comments)    Muscle weakness   Norco [Hydrocodone-Acetaminophen] Other (See Comments)    Headaches  Tolerates acetaminophen    Other Diarrhea    Real Butter - diarrhea   Tomato Other (See Comments)    Belching    Vibra-Tab [Doxycycline] Other (See Comments)    Unknown reaction   Codeine Other (See Comments)    headache   Hydrocodone-Acetaminophen Other (See Comments)    Headache. Tolerates acetaminophen.    Family History  Problem Relation Age of Onset   Heart disease Mother    Stroke Mother    Cancer Father        colon   Alzheimer's disease Sister    Alzheimer's disease Sister    Cancer Brother        brain   Breast cancer Neg Hx     Social History:  reports that she has never smoked. She has never used smokeless tobacco. She reports that she does not drink alcohol and does not use drugs.  ROS: A complete  review of systems was performed.  All systems are negative except for pertinent findings as noted.  Physical Exam:  Vital signs in last 24 hours: Temp:  [98 F (36.7 C)-99.2 F (37.3 C)] 98.4 F (36.9 C) (09/12 0902) Pulse Rate:  [70-79] 74 (09/12 0902) Resp:  [15-31] 18 (09/12 0902) BP: (106-150)/(55-89) 150/78 (09/12 0902) SpO2:  [98 %-100 %] 98 % (09/12 0902) Weight:  [57.2 kg] 57.2 kg (09/11 1632) Constitutional:  Alert and oriented, No acute distress Cardiovascular: Regular rate and rhythm Respiratory: Normal respiratory effort, Lungs clear bilaterally GI: Abdomen is soft, nontender, nondistended, no abdominal masses GU: No CVA tenderness Neurologic: Grossly intact, no focal deficits Psychiatric: Normal mood and affect  Laboratory Data:  Recent Labs    10/29/21 1709 10/30/21 0226  WBC 13.2* 11.5*  HGB 8.4* 8.0*  HCT 28.3* 26.3*  PLT 341 326    Recent Labs    10/29/21 1709 10/30/21 0226  NA 137 139  K 4.1 4.2  CL 97* 99  GLUCOSE 343* 271*  BUN 20 29*  CALCIUM 8.6* 8.7*  CREATININE 3.38* 4.11*     Results for orders placed or performed during the hospital encounter of 10/29/21 (from the past 24 hour(s))  Urinalysis, Routine w reflex microscopic Urine, Clean Catch     Status: Abnormal   Collection Time: 10/29/21  3:30 PM  Result Value Ref Range   Color, Urine YELLOW YELLOW   APPearance HAZY (A) CLEAR   Specific Gravity, Urine 1.015 1.005 - 1.030   pH 7.0 5.0 - 8.0   Glucose, UA NEGATIVE NEGATIVE mg/dL   Hgb urine dipstick SMALL (A) NEGATIVE   Bilirubin Urine NEGATIVE NEGATIVE   Ketones, ur NEGATIVE NEGATIVE mg/dL   Protein, ur >=300 (A) NEGATIVE mg/dL   Nitrite NEGATIVE NEGATIVE   Leukocytes,Ua NEGATIVE NEGATIVE   RBC / HPF 0-5 0 - 5 RBC/hpf   WBC, UA 6-10 0 - 5 WBC/hpf   Bacteria, UA RARE (A) NONE SEEN   Squamous  Epithelial / LPF 11-20 0 - 5   Mucus PRESENT   Comprehensive metabolic panel     Status: Abnormal   Collection Time: 10/29/21  5:09 PM   Result Value Ref Range   Sodium 137 135 - 145 mmol/L   Potassium 4.1 3.5 - 5.1 mmol/L   Chloride 97 (L) 98 - 111 mmol/L   CO2 28 22 - 32 mmol/L   Glucose, Bld 343 (H) 70 - 99 mg/dL   BUN 20 8 - 23 mg/dL   Creatinine, Ser 3.38 (H) 0.44 - 1.00 mg/dL   Calcium 8.6 (L) 8.9 - 10.3 mg/dL   Total Protein 6.6 6.5 - 8.1 g/dL   Albumin 2.3 (L) 3.5 - 5.0 g/dL   AST 26 15 - 41 U/L   ALT 19 0 - 44 U/L   Alkaline Phosphatase 67 38 - 126 U/L   Total Bilirubin 0.6 0.3 - 1.2 mg/dL   GFR, Estimated 13 (L) >60 mL/min   Anion gap 12 5 - 15  Lactic acid, plasma     Status: Abnormal   Collection Time: 10/29/21  5:09 PM  Result Value Ref Range   Lactic Acid, Venous 3.6 (HH) 0.5 - 1.9 mmol/L  CBC with Differential     Status: Abnormal   Collection Time: 10/29/21  5:09 PM  Result Value Ref Range   WBC 13.2 (H) 4.0 - 10.5 K/uL   RBC 3.04 (L) 3.87 - 5.11 MIL/uL   Hemoglobin 8.4 (L) 12.0 - 15.0 g/dL   HCT 28.3 (L) 36.0 - 46.0 %   MCV 93.1 80.0 - 100.0 fL   MCH 27.6 26.0 - 34.0 pg   MCHC 29.7 (L) 30.0 - 36.0 g/dL   RDW 17.2 (H) 11.5 - 15.5 %   Platelets 341 150 - 400 K/uL   nRBC 0.0 0.0 - 0.2 %   Neutrophils Relative % 90 %   Neutro Abs 12.0 (H) 1.7 - 7.7 K/uL   Lymphocytes Relative 6 %   Lymphs Abs 0.8 0.7 - 4.0 K/uL   Monocytes Relative 3 %   Monocytes Absolute 0.3 0.1 - 1.0 K/uL   Eosinophils Relative 0 %   Eosinophils Absolute 0.0 0.0 - 0.5 K/uL   Basophils Relative 0 %   Basophils Absolute 0.0 0.0 - 0.1 K/uL   Immature Granulocytes 1 %   Abs Immature Granulocytes 0.07 0.00 - 0.07 K/uL  Protime-INR     Status: Abnormal   Collection Time: 10/29/21  5:09 PM  Result Value Ref Range   Prothrombin Time 17.3 (H) 11.4 - 15.2 seconds   INR 1.4 (H) 0.8 - 1.2  Procalcitonin - Baseline     Status: None   Collection Time: 10/29/21  5:09 PM  Result Value Ref Range   Procalcitonin 0.49 ng/mL  Culture, blood (Routine x 2)     Status: None (Preliminary result)   Collection Time: 10/29/21  5:10 PM    Specimen: BLOOD  Result Value Ref Range   Specimen Description BLOOD SITE NOT SPECIFIED    Special Requests      BOTTLES DRAWN AEROBIC AND ANAEROBIC Blood Culture adequate volume   Culture      NO GROWTH < 24 HOURS Performed at [facility], [address], [place], [place] [address]    Report Status PENDING   SARS Coronavirus 2 by RT PCR (hospital order, performed in [facility] hospital lab) *cepheid single result test* Anterior Nasal Swab     Status: None   Collection  Time: 10/29/21  7:00 PM   Specimen: Anterior Nasal Swab  Result Value Ref Range   SARS Coronavirus 2 by RT PCR NEGATIVE NEGATIVE  Culture, blood (Routine x 2)     Status: None (Preliminary result)   Collection Time: 10/29/21  7:16 PM   Specimen: BLOOD  Result Value Ref Range   Specimen Description BLOOD RIGHT ANTECUBITAL    Special Requests      BOTTLES DRAWN AEROBIC AND ANAEROBIC Blood Culture results may not be optimal due to an excessive volume of blood received in culture bottles   Culture      NO GROWTH < 12 HOURS Performed at [place] [address], [place],  [address]    Report Status PENDING   Lactic acid, plasma     Status: Abnormal   Collection Time: 10/29/21  8:39 PM  Result Value Ref Range   Lactic Acid, Venous 2.2 (HH) 0.5 - 1.9 mmol/L  Glucose, capillary     Status: Abnormal   Collection Time: 10/30/21 12:10 AM  Result Value Ref Range   Glucose-Capillary 196 (H) 70 - 99 mg/dL  Magnesium     Status: None   Collection Time: 10/30/21  2:26 AM  Result Value Ref Range   Magnesium 2.2 1.7 - 2.4 mg/dL  CBC with Differential/Platelet     Status: Abnormal   Collection Time: 10/30/21  2:26 AM  Result Value Ref Range   WBC 11.5 (H) 4.0 - 10.5 K/uL   RBC 2.89 (L) 3.87 - 5.11 MIL/uL   Hemoglobin 8.0 (L) 12.0 - 15.0 g/dL   HCT 26.3 (L) 36.0 - 46.0 %   MCV 91.0 80.0 - 100.0 fL   MCH 27.7 26.0 - 34.0 pg   MCHC 30.4 30.0 - 36.0 g/dL   RDW 17.3 (H) 11.5 - 15.5 %   Platelets 326  150 - 400 K/uL   nRBC 0.0 0.0 - 0.2 %   Neutrophils Relative % 81 %   Neutro Abs 9.4 (H) 1.7 - 7.7 K/uL   Lymphocytes Relative 9 %   Lymphs Abs 1.1 0.7 - 4.0 K/uL   Monocytes Relative 7 %   Monocytes Absolute 0.8 0.1 - 1.0 K/uL   Eosinophils Relative 2 %   Eosinophils Absolute 0.2 0.0 - 0.5 K/uL   Basophils Relative 0 %   Basophils Absolute 0.0 0.0 - 0.1 K/uL   Immature Granulocytes 1 %   Abs Immature Granulocytes 0.08 (H) 0.00 - 0.07 K/uL  Comprehensive metabolic panel     Status: Abnormal   Collection Time: 10/30/21  2:26 AM  Result Value Ref Range   Sodium 139 135 - 145 mmol/L   Potassium 4.2 3.5 - 5.1 mmol/L   Chloride 99 98 - 111 mmol/L   CO2 27 22 - 32 mmol/L   Glucose, Bld 271 (H) 70 - 99 mg/dL   BUN 29 (H) 8 - 23 mg/dL   Creatinine, Ser 4.11 (H) 0.44 - 1.00 mg/dL   Calcium 8.7 (L) 8.9 - 10.3 mg/dL   Total Protein 6.3 (L) 6.5 - 8.1 g/dL   Albumin 2.2 (L) 3.5 - 5.0 g/dL   AST 15 15 - 41 U/L   ALT 16 0 - 44 U/L   Alkaline Phosphatase 62 38 - 126 U/L   Total Bilirubin 0.7 0.3 - 1.2 mg/dL   GFR, Estimated 10 (L) >60 mL/min   Anion gap 13 5 - 15  Glucose, capillary     Status: Abnormal   Collection  Time: 10/30/21  8:32 AM  Result Value Ref Range   Glucose-Capillary 160 (H) 70 - 99 mg/dL   Recent Results (from the past 240 hour(s))  Culture, blood (Routine x 2)     Status: None (Preliminary result)   Collection Time: 10/29/21  5:10 PM   Specimen: BLOOD  Result Value Ref Range Status   Specimen Description BLOOD SITE NOT SPECIFIED  Final   Special Requests   Final    BOTTLES DRAWN AEROBIC AND ANAEROBIC Blood Culture adequate volume   Culture   Final    NO GROWTH < 24 HOURS Performed at [facility], [place] [address], [place], [place] [address]    Report Status PENDING  Incomplete  SARS Coronavirus 2 by RT PCR (hospital order, performed in [facility] hospital lab) *cepheid single result test* Anterior Nasal Swab     Status: None   Collection Time: 10/29/21   7:00 PM   Specimen: Anterior Nasal Swab  Result Value Ref Range Status   SARS Coronavirus 2 by RT PCR NEGATIVE NEGATIVE Final    Comment: (NOTE) SARS-CoV-2 target nucleic acids are NOT DETECTED.  The SARS-CoV-2 RNA is generally detectable in upper and lower respiratory specimens during the acute phase of infection. The lowest concentration of SARS-CoV-2 viral copies this assay can detect is 250 copies / mL. A negative result does not preclude SARS-CoV-2 infection and should not be used as the sole basis for treatment or other patient management decisions.  A negative result may occur with improper specimen collection / handling, submission of specimen other than nasopharyngeal swab, presence of viral mutation(s) within the areas targeted by this assay, and inadequate number of viral copies (<250 copies / mL). A negative result must be combined with clinical observations, patient history, and epidemiological information.  Fact Sheet for Patients:   https://www.patel.info/  Fact Sheet for Healthcare Providers: https://hall.com/  This test is not yet approved or  cleared by the [place] FDA and has been authorized for detection and/or diagnosis of SARS-CoV-2 by FDA under an Emergency Use Authorization (EUA).  This EUA will remain in effect (meaning this test can be used) for the duration of the COVID-19 declaration under Section 564(b)(1) of the Act, 21 U.S.C. section 360bbb-3(b)(1), unless the authorization is terminated or revoked sooner.  Performed at [facility], [place] [address], [place], [place] [address]   Culture, blood (Routine x 2)     Status: None (Preliminary result)   Collection Time: 10/29/21  7:16 PM   Specimen: BLOOD  Result Value Ref Range Status   Specimen Description BLOOD RIGHT ANTECUBITAL  Final   Special Requests   Final    BOTTLES DRAWN AEROBIC AND ANAEROBIC Blood Culture results may not be optimal due  to an excessive volume of blood received in culture bottles   Culture   Final    NO GROWTH < 12 HOURS Performed at [facility], [place] [address] [place],  [address]    Report Status PENDING  Incomplete    Renal Function: Recent Labs    10/29/21 1709 10/30/21 0226  CREATININE 3.38* 4.11*   Estimated Creatinine Clearance: 8.1 mL/min (A) (by C-G formula based on SCr of 4.11 mg/dL (H)).  Radiologic Imaging: CT Renal Stone Study  Result Date: 10/29/2021 CLINICAL DATA:  Previous renal mass with fever EXAM: CT ABDOMEN AND PELVIS WITHOUT CONTRAST TECHNIQUE: Multidetector CT imaging of the abdomen and pelvis was performed following the standard protocol without IV contrast.  RADIATION DOSE REDUCTION: This exam was performed according to the departmental dose-optimization program which includes automated exposure control, adjustment of the mA and/or kV according to patient size and/or use of iterative reconstruction technique. COMPARISON:  CT 09/06/2021, 08/25/2021, 08/22/2021, 05/03/2021 FINDINGS: Lower chest: Lung bases demonstrate no consolidation or effusion. Minimal mosaic attenuation which could be due to small airways disease. Coronary vascular calcification. Trace air in the anterior right atrium is presumably from line placement. Hepatobiliary: No focal liver abnormality is seen. Status post cholecystectomy. No biliary dilatation. Pancreas: Status post previous Whipple procedure. Atrophy of the distal pancreas without inflammation. Spleen: Normal in size without focal abnormality. Adrenals/Urinary Tract: Adrenal glands are within normal limits. Suspected cortical thinning within the bilateral kidneys. Numerous bilateral cysts and hyperdense renal cortical lesions, further assessment limited without contrast. Suspect that there is moderate severe left hydronephrosis. Embolization material in the left kidney slightly different orientation compared to prior, likely due to mass effect from  large process in the lower pole. The patient's left renal mass measures approximately 3.1 cm, coronal series 6, image 46. Rounded area of heterogeneous density surrounding the lower pole renal lesion, there is large fluid component with remainder of mostly fat density, the area measures approximately 7.9 x 7.6 cm on coronal series 6 image 45; this measures 10.3 cm AP on series 3, image 40. It appears more organized compared to prior CT. The urinary bladder is unremarkable. Stomach/Bowel: The stomach shows postsurgical changes and moderate debris. There is no dilated small bowel. No acute bowel wall thickening. Vascular/Lymphatic: Advanced aortic atherosclerosis. No aneurysm. Subcentimeter retroperitoneal lymph nodes. Reproductive: Status post hysterectomy. No adnexal masses. Other: Negative for pelvic effusion or free air. Numerous phleboliths and pelvic calcifications. Small fat containing ventral hernias. Musculoskeletal: No acute osseous abnormality. IMPRESSION: 1. Patient is status post ablation of mass in the lower pole of the left kidney. Interim organizing heterogenous mass at the lower pole of the left kidney surrounding the renal mass, this demonstrates mostly fat and fluid density and is presumably related to evolving ablation changes. There is no evidence for acute hematoma or active extravasation. There is however interim development of suspected moderate severe hydronephrosis of the left kidney with probable obstruction of the proximal ureter by the changes occurring in the lower pole of the left kidney. 2. History of prior Whipple procedure. No adverse features related to this finding allowing for limited exam due to absence of contrast. 3. Cardiomegaly Electronically Signed   By: Donavan Foil M.D.   On: 10/29/2021 20:31   DG Chest 2 View  Result Date: 10/29/2021 CLINICAL DATA:  Suspected sepsis EXAM: CHEST - 2 VIEW COMPARISON:  09/26/2021, 09/05/2021 FINDINGS: Low lung volumes. No pleural  effusion or pneumothorax. No focal consolidation. Borderline cardiac size. Aortic atherosclerosis. Coils in the left upper quadrant. IMPRESSION: No active cardiopulmonary disease.  Hypoventilatory change. Electronically Signed   By: Donavan Foil M.D.   On: 10/29/2021 17:33    I independently reviewed the above imaging studies.  Impression/Recommendation: 86 yo F with a history of L RCC s/p ablation in July, now with fluid collection adjacent to L kidney, worsening hydro in upper pole of L kidney. Fluid collection suspicious for abscess vs urine collection   -Recommend consulting IR for drain +/- left nephrostomy tube with fluid studies for creatinine and creatinine to characterize collection. -continue broad spectrum abx in the meantime -will follow  Donald Pore 10/30/2021, 9:29 AM  [facility]  Pager: [phone]

## 2021-10-29 NOTE — ED Notes (Signed)
ED TO INPATIENT HANDOFF REPORT  ED Nurse Name and Phone #: Kim Burns [phone]  E Name/Age/Gender Kim Burns 86 y.o. female Room/Bed: 012C/012C  Code Status   Code Status: Prior  Home/SNF/Other  Patient oriented to: self, place, time, and situation Is this baseline? Yes   Triage Complete: Triage complete  Chief Complaint Fever [R50.9]  Triage Note Patient sent by Dr Kim Burns for abnormal labs and intemittent fever for past 3-4 weeks.  Patient is a dialysis patient Labs drawn on 9/7 Calcium was 7.9 and BNP 1661  WBC 11.7 and Hgb 8.5 Last dialysis was today.  Dialysis made her wait 67mns bc she was so weak.  FCaren Macadamreports she was so weak she couldn't even stand up and she just laid in the floor for alittle bit.    Allergies Allergies  Allergen Reactions   Lopid [Gemfibrozil] Other (See Comments)    Myalgia    Lotemax [Loteprednol Etabonate] Rash    Skin burning    Statins Other (See Comments)    Muscle weakness   Norco [Hydrocodone-Acetaminophen] Other (See Comments)    Headaches  Tolerates acetaminophen    Other Diarrhea    Real Butter - diarrhea   Tomato Other (See Comments)    Belching    Vibra-Tab [Doxycycline] Other (See Comments)    Unknown reaction   Codeine Other (See Comments)    headache   Hydrocodone-Acetaminophen Other (See Comments)    Headache. Tolerates acetaminophen.    Level of Care/Admitting Diagnosis ED Disposition     ED Disposition  Admit   Condition  --   [name][[id number]]  Level of Care: Telemetry Medical [104]  [facility] is available:: No  Covid Evaluation: Confirmed COVID Negative  Diagnosis: Fever [344092]  Admitting Physician: Kim Burns[[[id number]] Attending Physician: Kim Burns[[[id number]]         B Medical/Surgery History Past Medical History:  Diagnosis Date   Anemia of chronic  disease    takes iron   Anxiety    Blood transfusion without reported diagnosis    Bradycardia    Diabetes mellitus without complication ([place]    became diabetic after Whipple procedure   Diarrhea    Dysrhythmia 04/16/2016   bradycardia due to medication    ESRD on hemodialysis ([facility]    M-W-Kim   GERD (gastroesophageal reflux disease)    Gout    Headache    History of kidney stones 06/2013   Hyperparathyroidism ([place]    Hypertension    PONV (postoperative nausea and vomiting)    Sleep apnea    no cpap machine. could not tolerate   Stroke ([place] 04/16/2016   TIA 1995   Vitamin D deficiency    Past Surgical History:  Procedure Laterality Date   A/V FISTULAGRAM Left 08/30/2021   Procedure: A/V Fistulagram;  Surgeon: CMarty Heck MD;  [place];  Service: Cardiovascular;  Laterality: Left;   ABDOMINAL HYSTERECTOMY  1985   complete   BACK SURGERY  10867  lower   BASCILIC VEIN TRANSPOSITION Left 04/24/2017   Procedure: LEFT ARM FIRST STAGE BASILIC VEIN TRANSPOSITION;  Surgeon: BSerafina Mitchell MD;  [facility]  Service: Vascular;  Laterality: Left;   [place] 07/[place]2017   Procedure: SECOND STAGE BASILIC VEIN TRANSPOSITION LEFT ARM;  Surgeon: BSerafina Mitchell MD;  [facility];  Service: Vascular;  Laterality: Left;   BREAST LUMPECTOMY Left x 2   many years apart, benign   CHOLECYSTECTOMY  1985   CYSTOSCOPY WITH URETEROSCOPY AND STENT PLACEMENT Left 06/18/2013   Procedure: CYSTOSCOPY WITH Lef URETEROSCOPY AND Left STENT PLACEMENT;  Surgeon: Dutch Gray, MD;  Location[facility];  Service: Urology;  Laterality: Left;   ESOPHAGOGASTRODUODENOSCOPY (EGD) WITH PROPOFOL N/A 11/13/2016   Procedure: ESOPHAGOGASTRODUODENOSCOPY (EGD) WITH PROPOFOL;  Surgeon: Otis Brace, MD;  Location: [place];  Service: Gastroenterology;  Laterality: N/A;   EUS N/A 02/07/2016   Procedure: ESOPHAGEAL ENDOSCOPIC ULTRASOUND (EUS) RADIAL;   Surgeon: Arta Silence, MD;  [facility];  Service: Endoscopy;  Laterality: N/A;   EYE SURGERY Bilateral 2014   ioc for catracts    FOOT SURGERY Left 1990   something with toes unsure what    HERNIA REPAIR  2018   IR EMBO TUMOR ORGAN ISCHEMIA INFARCT INC GUIDE ROADMAPPING  08/22/2021   IR RADIOLOGIST EVAL & MGMT  05/18/2021   IR RADIOLOGIST EVAL & MGMT  10/01/2021   IR RENAL SUPRASEL UNI S&I MOD SED  08/22/2021   IR US GUIDE VASC ACCESS RIGHT  08/22/2021   LEFT HEART CATH AND CORONARY ANGIOGRAPHY N/A 08/27/2021   Procedure: LEFT HEART CATH AND CORONARY ANGIOGRAPHY;  Surgeon: Burnell Blanks, MD;  Location: [place] [facility];  Service: Cardiovascular;  Laterality: N/A;   PARATHYROID EXPLORATION     PERIPHERAL VASCULAR BALLOON ANGIOPLASTY Left 08/30/2021   Procedure: PERIPHERAL VASCULAR BALLOON ANGIOPLASTY;  Surgeon: Marty Heck, MD;  Location: [place] [facility];  Service: Cardiovascular;  Laterality: Left;  arm fistula   RADIOLOGY WITH ANESTHESIA Left 08/22/2021   Procedure: MICROWAVE ABLATION;  Surgeon: Sandi Mariscal, MD;  [facility];  Service: Anesthesiology;  Laterality: Left;   WHIPPLE PROCEDURE N/A 04/23/2016   Procedure: WHIPPLE PROCEDURE;  Surgeon: Stark Klein, MD;  Location: [place] [facility];  Service: General;  Laterality: N/A;     A IV Location/Drains/Wounds Patient Lines/Drains/Airways Status     Active Line/Drains/Airways     Name Placement date Placement time Site Days   Peripheral IV 10/29/21 20 G Anterior;Proximal;Right Forearm 10/29/21  1925  Forearm  less than 1   Fistula / Graft Left Upper arm Arteriovenous fistula 04/24/17  1031  Upper arm  1649   Ureteral Drain/Stent Left ureter 6 Fr. 06/18/13  1738  Left ureter  3055   Incision (Closed) 04/23/16 Abdomen Other (Comment) 04/23/16  1446  -- 2015   Incision (Closed) 04/24/17 Arm Left 04/24/17  1031  -- 1649   Incision (Closed) 07/10/17 Arm Left 07/10/17  0945  -- 1572            Intake/Output  Last 24 hours  Intake/Output Summary (Last 24 hours) at 10/29/2021 2157 Last data filed at 10/29/2021 2035 Gross per 24 hour  Intake 242.12 ml  Output --  Net 242.12 ml    Labs/Imaging Results for orders placed or performed during the hospital encounter of 10/29/21 (from the past 48 hour(s))  Urinalysis, Routine w reflex microscopic Urine, Clean Catch     Status: Abnormal   Collection Time: 10/29/21  3:30 PM  Result Value Ref Range   Color, Urine YELLOW YELLOW   APPearance HAZY (A) CLEAR   Specific Gravity, Urine 1.015 1.005 - 1.030   pH 7.0 5.0 - 8.0   Glucose, UA NEGATIVE NEGATIVE mg/dL   Hgb urine dipstick SMALL (A) NEGATIVE   Bilirubin Urine NEGATIVE  NEGATIVE   Ketones, ur NEGATIVE NEGATIVE mg/dL   Protein, ur >=300 (A) NEGATIVE mg/dL   Nitrite NEGATIVE NEGATIVE   Leukocytes,Ua NEGATIVE NEGATIVE   RBC / HPF 0-5 0 - 5 RBC/hpf   WBC, UA 6-10 0 - 5 WBC/hpf   Bacteria, UA RARE (A) NONE SEEN   Squamous Epithelial / LPF 11-20 0 - 5   Mucus PRESENT     Comment: Performed at [facility], [place] [address], [place], [place] [address]  Comprehensive metabolic panel     Status: Abnormal   Collection Time: 10/29/21  5:09 PM  Result Value Ref Range   Sodium 137 135 - 145 mmol/L   Potassium 4.1 3.5 - 5.1 mmol/L   Chloride 97 (L) 98 - 111 mmol/L   CO2 28 22 - 32 mmol/L   Glucose, Bld 343 (H) 70 - 99 mg/dL    Comment: Glucose reference range applies only to samples taken after fasting for at least 8 hours.   BUN 20 8 - 23 mg/dL   Creatinine, Ser 3.38 (H) 0.44 - 1.00 mg/dL   Calcium 8.6 (L) 8.9 - 10.3 mg/dL   Total Protein 6.6 6.5 - 8.1 g/dL   Albumin 2.3 (L) 3.5 - 5.0 g/dL   AST 26 15 - 41 U/L   ALT 19 0 - 44 U/L   Alkaline Phosphatase 67 38 - 126 U/L   Total Bilirubin 0.6 0.3 - 1.2 mg/dL   GFR, Estimated 13 (L) >60 mL/min    Comment: (NOTE) Calculated using the CKD-EPI Creatinine Equation (2021)    Anion gap 12 5 - 15    Comment: Performed at [facility] [address] [place], [place] [address]  Lactic acid, plasma     Status: Abnormal   Collection Time: 10/29/21  5:09 PM  Result Value Ref Range   Lactic Acid, Venous 3.6 (HH) 0.5 - 1.9 mmol/L    Comment: CRITICAL RESULT CALLED TO, READ BACK BY AND VERIFIED WITH M.ROBERTS,RN '@[id number]'$  10/29/2021 VANG.J Performed at [facility], [place] [address] [place], [place] [address]   CBC with Differential     Status: Abnormal   Collection Time: 10/29/21  5:09 PM  Result Value Ref Range   WBC 13.2 (H) 4.0 - 10.5 K/uL   RBC 3.04 (L) 3.87 - 5.11 MIL/uL   Hemoglobin 8.4 (L) 12.0 - 15.0 g/dL   HCT 28.3 (L) 36.0 - 46.0 %   MCV 93.1 80.0 - 100.0 fL   MCH 27.6 26.0 - 34.0 pg   MCHC 29.7 (L) 30.0 - 36.0 g/dL   RDW 17.2 (H) 11.5 - 15.5 %   Platelets 341 150 - 400 K/uL   nRBC 0.0 0.0 - 0.2 %   Neutrophils Relative % 90 %   Neutro Abs 12.0 (H) 1.7 - 7.7 K/uL   Lymphocytes Relative 6 %   Lymphs Abs 0.8 0.7 - 4.0 K/uL   Monocytes Relative 3 %   Monocytes Absolute 0.3 0.1 - 1.0 K/uL   Eosinophils Relative 0 %   Eosinophils Absolute 0.0 0.0 - 0.5 K/uL   Basophils Relative 0 %   Basophils Absolute 0.0 0.0 - 0.1 K/uL   Immature Granulocytes 1 %   Abs Immature Granulocytes 0.07 0.00 - 0.07 K/uL    Comment: Performed at [facility], [place] [address] [place], [place] [address]  Protime-INR     Status: Abnormal   Collection Time: 10/29/21  5:09 PM  Result Value  Ref Range   Prothrombin Time 17.3 (H) 11.4 - 15.2 seconds   INR 1.4 (H) 0.8 - 1.2    Comment: (NOTE) INR goal varies based on device and disease states. Performed at [facility], [place] [address] [place], [place] [address]   SARS Coronavirus 2 by RT PCR (hospital order, performed in [facility] hospital lab) *cepheid single result test* Anterior Nasal Swab     Status: None   Collection Time: 10/29/21  7:00 PM   Specimen: Anterior Nasal Swab  Result Value Ref Range   SARS Coronavirus 2 by RT PCR NEGATIVE NEGATIVE    Comment:  (NOTE) SARS-CoV-2 target nucleic acids are NOT DETECTED.  The SARS-CoV-2 RNA is generally detectable in upper and lower respiratory specimens during the acute phase of infection. The lowest concentration of SARS-CoV-2 viral copies this assay can detect is 250 copies / mL. A negative result does not preclude SARS-CoV-2 infection and should not be used as the sole basis for treatment or other patient management decisions.  A negative result may occur with improper specimen collection / handling, submission of specimen other than nasopharyngeal swab, presence of viral mutation(s) within the areas targeted by this assay, and inadequate number of viral copies (<250 copies / mL). A negative result must be combined with clinical observations, patient history, and epidemiological information.  Fact Sheet for Patients:   https://www.patel.info/  Fact Sheet for Healthcare Providers: https://hall.com/  This test is not yet approved or  cleared by the [place] FDA and has been authorized for detection and/or diagnosis of SARS-CoV-2 by FDA under an Emergency Use Authorization (EUA).  This EUA will remain in effect (meaning this test can be used) for the duration of the COVID-19 declaration under Section 564(b)(1) of the Act, 21 U.S.C. section 360bbb-3(b)(1), unless the authorization is terminated or revoked sooner.  Performed at [facility], [place] [address] [place], [place] [address]   Lactic acid, plasma     Status: Abnormal   Collection Time: 10/29/21  8:39 PM  Result Value Ref Range   Lactic Acid, Venous 2.2 (HH) 0.5 - 1.9 mmol/L    Comment: CRITICAL RESULT CALLED TO, READ BACK BY AND VERIFIED WITH Chucky May, RN, 2147 10/29/21, Loni Muse RAMSEY Performed at [facility], [address] [place], [place] [address]    CT Renal Stone Study  Result Date: 10/29/2021 CLINICAL DATA:  Previous renal mass with fever EXAM: CT ABDOMEN AND  PELVIS WITHOUT CONTRAST TECHNIQUE: Multidetector CT imaging of the abdomen and pelvis was performed following the standard protocol without IV contrast. RADIATION DOSE REDUCTION: This exam was performed according to the departmental dose-optimization program which includes automated exposure control, adjustment of the mA and/or kV according to patient size and/or use of iterative reconstruction technique. COMPARISON:  CT 09/06/2021, 08/25/2021, 08/22/2021, 05/03/2021 FINDINGS: Lower chest: Lung bases demonstrate no consolidation or effusion. Minimal mosaic attenuation which could be due to small airways disease. Coronary vascular calcification. Trace air in the anterior right atrium is presumably from line placement. Hepatobiliary: No focal liver abnormality is seen. Status post cholecystectomy. No biliary dilatation. Pancreas: Status post previous Whipple procedure. Atrophy of the distal pancreas without inflammation. Spleen: Normal in size without focal abnormality. Adrenals/Urinary Tract: Adrenal glands are within normal limits. Suspected cortical thinning within the bilateral kidneys. Numerous bilateral cysts and hyperdense renal cortical lesions, further assessment limited without contrast. Suspect that there is moderate severe left hydronephrosis. Embolization material in the left kidney slightly different orientation  compared to prior, likely due to mass effect from large process in the lower pole. The patient's left renal mass measures approximately 3.1 cm, coronal series 6, image 46. Rounded area of heterogeneous density surrounding the lower pole renal lesion, there is large fluid component with remainder of mostly fat density, the area measures approximately 7.9 x 7.6 cm on coronal series 6 image 45; this measures 10.3 cm AP on series 3, image 40. It appears more organized compared to prior CT. The urinary bladder is unremarkable. Stomach/Bowel: The stomach shows postsurgical changes and moderate debris.  There is no dilated small bowel. No acute bowel wall thickening. Vascular/Lymphatic: Advanced aortic atherosclerosis. No aneurysm. Subcentimeter retroperitoneal lymph nodes. Reproductive: Status post hysterectomy. No adnexal masses. Other: Negative for pelvic effusion or free air. Numerous phleboliths and pelvic calcifications. Small fat containing ventral hernias. Musculoskeletal: No acute osseous abnormality. IMPRESSION: 1. Patient is status post ablation of mass in the lower pole of the left kidney. Interim organizing heterogenous mass at the lower pole of the left kidney surrounding the renal mass, this demonstrates mostly fat and fluid density and is presumably related to evolving ablation changes. There is no evidence for acute hematoma or active extravasation. There is however interim development of suspected moderate severe hydronephrosis of the left kidney with probable obstruction of the proximal ureter by the changes occurring in the lower pole of the left kidney. 2. History of prior Whipple procedure. No adverse features related to this finding allowing for limited exam due to absence of contrast. 3. Cardiomegaly Electronically Signed   By: Donavan Foil M.D.   On: 10/29/2021 20:31   DG Chest 2 View  Result Date: 10/29/2021 CLINICAL DATA:  Suspected sepsis EXAM: CHEST - 2 VIEW COMPARISON:  09/26/2021, 09/05/2021 FINDINGS: Low lung volumes. No pleural effusion or pneumothorax. No focal consolidation. Borderline cardiac size. Aortic atherosclerosis. Coils in the left upper quadrant. IMPRESSION: No active cardiopulmonary disease.  Hypoventilatory change. Electronically Signed   By: Donavan Foil M.D.   On: 10/29/2021 17:33    Pending Labs Unresulted Labs (From admission, onward)     Start     Ordered   10/29/21 1635  Culture, blood (Routine x 2)  BLOOD CULTURE X 2,   R      10/29/21 1634            Vitals/Pain Today's Vitals   10/29/21 1632 10/29/21 1836 10/29/21 2000 10/29/21 2145   BP:  108/60 (!) 123/59 (!) 124/55  Pulse:  70 75 72  Resp:  16 (!) 31 19  Temp:  98 Kim (36.7 C)    TempSrc:  Oral    SpO2:  99% 100% 100%  Weight: 57.2 kg     Height: '5\' 2"'$  (1.575 m)     PainSc:  0-No pain      Isolation Precautions Airborne and Contact precautions  Medications Medications  piperacillin-tazobactam (ZOSYN) IVPB 2.25 g (has no administration in time range)  vancomycin (VANCOCIN) IVPB 1000 mg/200 mL premix (0 mg Intravenous Stopped 10/29/21 2035)  piperacillin-tazobactam (ZOSYN) IVPB 3.375 g (0 g Intravenous Stopped 10/29/21 2012)    Mobility walks Moderate fall risk   Focused Assessments Cardiac Assessment Handoff:    Lab Results  Component Value Date   TROPONINI <0.30 06/18/2013   No results found for: "DDIMER" Does the Patient currently have chest pain? No   , Renal Assessment Handoff:  Hemodialysis Schedule:  Last Hemodialysis date and time:  10/29/2021   Restricted appendage: left arm  ,  Pulmonary Assessment Handoff:  Lung sounds:   O2 Device: Room Air      R Recommendations: See Admitting Provider Note  Report given to:   Additional Notes:

## 2021-10-29 NOTE — ED Triage Notes (Signed)
Patient sent by Dr Ernie Hew for abnormal labs and intemittent fever for past 3-4 weeks.  Patient is a dialysis patient Labs drawn on 9/7 Calcium was 7.9 and BNP 1661  WBC 11.7 and Hgb 8.5 Last dialysis was today.  Dialysis made her wait 36mns bc she was so weak.  FCaren Macadamreports she was so weak she couldn't even stand up and she just laid in the floor for alittle bit.

## 2021-10-29 NOTE — ED Provider Notes (Signed)
[facility] EMERGENCY DEPARTMENT Provider Note   CSN: [id number] Arrival date & time: 10/29/21  1530     History  Chief Complaint  Patient presents with   Abnormal Lab    MOSELLA Burns is a 86 y.o. female history of ESRD on dialysis here with intermittent fevers.  Patient has been having low-grade temperature for the last month or so.  Patient states that it is just 99-100.  However it last night went up to 102.  Patient did go to dialysis today and finished dialysis.  Afterwards she was very tired and had to rest for about 45 minutes.  Patient was able to go home and felt very weak and was unable to get up.  Denies any falls.  Patient denies any COVID exposure.  Patient was concerned about her fistula and that was declotted back in July when she was admitted to the hospital.  Patient states that they had no problem dialyzing her recently and she is scheduled to see vascular surgeon for follow-up next week.  Of note, patient had a previous kidney mass that was removed and patient still urinates infrequently.  The history is provided by the patient.       Home Medications Prior to Admission medications   Medication Sig Start Date End Date Taking? Authorizing Provider  acetaminophen (TYLENOL 8 HOUR ARTHRITIS PAIN) 650 MG CR tablet Take 1,300 mg by mouth 2 (two) times daily as needed for pain.    [provider]  amiodarone (PACERONE) 200 MG tablet Take 1 tablet (200 mg total) by mouth daily. Please keep appointment for further refills 10/01/21   Martinique, Peter M, MD  apixaban (ELIQUIS) 2.5 MG TABS tablet Take 1 tablet (2.5 mg total) by mouth 2 (two) times daily. 09/25/21 03/24/22  Martinique, Peter M, MD  Cholecalciferol (VITAMIN D-3 PO) Take 1 capsule by mouth every Monday, Wednesday, and Friday with hemodialysis.    [provider]  colestipol (COLESTID) 1 g tablet Take 1 g by mouth See admin instructions. 1 tablet (1g) prior to dialysis M-W-F + 1 tablet  daily PRN diarrhea    [provider]  diclofenac sodium (VOLTAREN) 1 % GEL Apply 2 g topically 4 (four) times daily as needed (for hand pain).     [provider]  diphenhydramine-acetaminophen (TYLENOL PM) 25-500 MG TABS tablet Take 1 tablet by mouth at bedtime as needed (sleep, pain).    [provider]  insulin aspart (NOVOLOG FLEXPEN) 100 UNIT/ML FlexPen Inject 10 Units into the skin 3 (three) times daily as needed (CBG > 200).    [provider]  lidocaine-prilocaine (EMLA) cream Apply 1 Application topically every Monday, Wednesday, and Friday with hemodialysis.    [provider]  lipase/protease/amylase (CREON) 36000 UNITS CPEP capsule Take 1 capsule (36,000 Units total) by mouth with snacks. Patient taking differently: Take 36,000-72,000 Units by mouth See admin instructions. 2 capsules(72000 units) TID with meals AND 1 capsule(36000 units) with snacks 08/30/21 11/28/21  Vashti Hey, MD  sevelamer carbonate (RENVELA) 800 MG tablet Take 1,600 mg by mouth 3 (three) times daily with meals.    [provider]      Allergies    Lopid [gemfibrozil], Lotemax [loteprednol etabonate], Statins, Norco [hydrocodone-acetaminophen], Other, Tomato, Vibra-tab [doxycycline], Codeine, and Hydrocodone-acetaminophen    Review of Systems   Review of Systems  Constitutional:  Positive for fever.  Neurological:  Positive for dizziness and weakness.  All other systems reviewed and are negative.  Physical Exam Updated Vital Signs BP 108/60 (BP Location: Right Arm)   Pulse 70   Temp 98 F (36.7 C) (Oral)   Resp 16   Ht '5\' 2"'$  (1.575 m)   Wt 57.2 kg   SpO2 99%   BMI 23.05 kg/m  Physical Exam Vitals and nursing note reviewed.  Constitutional:      Comments: Chronically ill-appearing  HENT:     Head: Normocephalic.     Nose: Nose normal.     Mouth/Throat:     Mouth: Mucous membranes are dry.  Eyes:     Extraocular Movements:  Extraocular movements intact.     Pupils: Pupils are equal, round, and reactive to light.  Cardiovascular:     Rate and Rhythm: Normal rate and regular rhythm.     Pulses: Normal pulses.     Heart sounds: Normal heart sounds.  Pulmonary:     Comments: Slightly tachypneic and crackles bilateral lung bases Abdominal:     General: Abdomen is flat.     Palpations: Abdomen is soft.  Musculoskeletal:        General: Normal range of motion.     Cervical back: Normal range of motion and neck supple.     Comments: Left arm fistula with good thrill.  No obvious signs of cellulitis  Skin:    General: Skin is warm.     Capillary Refill: Capillary refill takes less than 2 seconds.  Neurological:     General: No focal deficit present.     Mental Status: She is oriented to person, place, and time.     Comments: Strength is 4 out of 5 bilateral arms and legs.  Psychiatric:        Mood and Affect: Mood normal.        Behavior: Behavior normal.     ED Results / Procedures / Treatments   Labs (all labs ordered are listed, but only abnormal results are displayed) Labs Reviewed  COMPREHENSIVE METABOLIC PANEL - Abnormal; Notable for the following components:      Result Value   Chloride 97 (*)    Glucose, Bld 343 (*)    Creatinine, Ser 3.38 (*)    Calcium 8.6 (*)    Albumin 2.3 (*)    GFR, Estimated 13 (*)    All other components within normal limits  LACTIC ACID, PLASMA - Abnormal; Notable for the following components:   Lactic Acid, Venous 3.6 (*)    All other components within normal limits  CBC WITH DIFFERENTIAL/PLATELET - Abnormal; Notable for the following components:   WBC 13.2 (*)    RBC 3.04 (*)    Hemoglobin 8.4 (*)    HCT 28.3 (*)    MCHC 29.7 (*)    RDW 17.2 (*)    Neutro Abs 12.0 (*)    All other components within normal limits  PROTIME-INR - Abnormal; Notable for the following components:   Prothrombin Time 17.3 (*)    INR 1.4 (*)    All other components within normal  limits  URINALYSIS, ROUTINE W REFLEX MICROSCOPIC - Abnormal; Notable for the following components:   APPearance HAZY (*)    Hgb urine dipstick SMALL (*)    Protein, ur >=300 (*)    Bacteria, UA RARE (*)    All other components within normal limits  CULTURE, BLOOD (ROUTINE X 2)  CULTURE, BLOOD (ROUTINE X 2)  SARS CORONAVIRUS 2 BY RT PCR  LACTIC ACID, PLASMA    EKG EKG Interpretation  Date/Time:  Monday October 29 2021 16:39:52 EDT Ventricular Rate:  74 PR Interval:  160 QRS Duration: 82 QT Interval:  288 QTC Calculation: 319 R Axis:   -14 Text Interpretation: Normal sinus rhythm Inferior infarct , age undetermined Possible Anterior infarct , age undetermined Abnormal ECG When compared with ECG of 19-Sep-2021 18:56, PREVIOUS ECG IS PRESENT Confirmed by Wandra Arthurs ([id number]) on 10/29/2021 6:50:31 PM  Radiology DG Chest 2 View  Result Date: 10/29/2021 CLINICAL DATA:  Suspected sepsis EXAM: CHEST - 2 VIEW COMPARISON:  09/26/2021, 09/05/2021 FINDINGS: Low lung volumes. No pleural effusion or pneumothorax. No focal consolidation. Borderline cardiac size. Aortic atherosclerosis. Coils in the left upper quadrant. IMPRESSION: No active cardiopulmonary disease.  Hypoventilatory change. Electronically Signed   By: Donavan Foil M.D.   On: 10/29/2021 17:33    Procedures Procedures    Medications Ordered in ED Medications  vancomycin (VANCOCIN) IVPB 1000 mg/200 mL premix (1,000 mg Intravenous New Bag/Given 10/29/21 1932)  piperacillin-tazobactam (ZOSYN) IVPB 3.375 g (has no administration in time range)  piperacillin-tazobactam (ZOSYN) IVPB 3.375 g (3.375 g Intravenous New Bag/Given 10/29/21 1932)    ED Course/ Medical Decision Making/ A&P                           Medical Decision Making JAN WALTERS is a 86 y.o. female here presenting with weakness and intermittent fevers.  Patient has been running low-grade temperature for the last several weeks.  Patient started running 102  temperature yesterday.  Consider pyelonephritis versus pneumonia versus COVID versus bacteremia.  Patient does not appear to have infected graft.  Plan to get CBC and CMP and lactate and cultures and chest x-ray and CT abdomen pelvis and urinalysis. Will give empiric abx. Patient likely need admission  8:53 PM White blood cell count is increased to 13 now.  I reviewed patient's labs and independently interpreted CT scan.  COVID test is negative.  Lactate is elevated but urinalysis unremarkable.  CT now shows a large mass in the left kidney with hydro-.  Patient states that she had ablation and embolization previously.  Concerned that the kidney mass is causing her fevers.  I have discussed case with Dr. Cain Sieve from urology.  He states that he will follow and see the patient.  I gave empiric antibiotics for now until blood culture comes back.  Problems Addressed: ESRD on dialysis [facility]): chronic illness or injury Renal mass: chronic illness or injury with exacerbation, progression, or side effects of treatment  Amount and/or Complexity of Data Reviewed Labs: ordered. Decision-making details documented in ED Course. Radiology: ordered and independent interpretation performed. Decision-making details documented in ED Course.  Risk Prescription drug management. Decision regarding hospitalization.    Final Clinical Impression(s) / ED Diagnoses Final diagnoses:  None    Rx / DC Orders ED Discharge Orders     None         Drenda Freeze, MD 10/29/21 2054

## 2021-10-29 NOTE — Progress Notes (Signed)
Pharmacy Antibiotic Note  Kim Burns is a 86 y.o. female admitted on 10/29/2021 presenting with intermittent fever.  Pharmacy has been consulted for zosyn dosing.  ESRD-HD  Plan: Zosyn 3.375g IV x1, then 2.25g IV every 8 hours Monitor iHD schedule, Cx and clinical progression to narrow    Height: '5\' 2"'$  (157.5 cm) Weight: 57.2 kg (126 lb) IBW/kg (Calculated) : 50.1  Temp (24hrs), Avg:98.2 F (36.8 C), Min:98 F (36.7 C), Max:98.3 F (36.8 C)  Recent Labs  Lab 10/29/21 1709  WBC 13.2*  CREATININE 3.38*  LATICACIDVEN 3.6*    Estimated Creatinine Clearance: 9.8 mL/min (A) (by C-G formula based on SCr of 3.38 mg/dL (H)).    Allergies  Allergen Reactions   Lopid [Gemfibrozil] Other (See Comments)    Myalgia    Lotemax [Loteprednol Etabonate] Rash    Skin burning    Statins Other (See Comments)    Muscle weakness   Norco [Hydrocodone-Acetaminophen] Other (See Comments)    Headaches  Tolerates acetaminophen    Other Diarrhea    Real Butter - diarrhea   Tomato Other (See Comments)    Belching    Vibra-Tab [Doxycycline] Other (See Comments)    Unknown reaction   Codeine Other (See Comments)    headache   Hydrocodone-Acetaminophen Other (See Comments)    Headache. Tolerates acetaminophen.    Bertis Ruddy, PharmD Clinical Pharmacist ED Pharmacist Phone # [phone] 10/29/2021 8:13 PM

## 2021-10-30 ENCOUNTER — Inpatient Hospital Stay (HOSPITAL_COMMUNITY): Payer: Medicare PPO

## 2021-10-30 ENCOUNTER — Encounter (HOSPITAL_COMMUNITY): Payer: Self-pay | Admitting: Internal Medicine

## 2021-10-30 ENCOUNTER — Other Ambulatory Visit: Payer: Self-pay | Admitting: Urology

## 2021-10-30 DIAGNOSIS — Z20822 Contact with and (suspected) exposure to covid-19: Secondary | ICD-10-CM | POA: Diagnosis present

## 2021-10-30 DIAGNOSIS — I480 Paroxysmal atrial fibrillation: Secondary | ICD-10-CM | POA: Diagnosis present

## 2021-10-30 DIAGNOSIS — N131 Hydronephrosis with ureteral stricture, not elsewhere classified: Secondary | ICD-10-CM | POA: Diagnosis present

## 2021-10-30 DIAGNOSIS — N130 Hydronephrosis with ureteropelvic junction obstruction: Secondary | ICD-10-CM

## 2021-10-30 DIAGNOSIS — R6511 Systemic inflammatory response syndrome (SIRS) of non-infectious origin with acute organ dysfunction: Secondary | ICD-10-CM | POA: Diagnosis not present

## 2021-10-30 DIAGNOSIS — N2889 Other specified disorders of kidney and ureter: Secondary | ICD-10-CM | POA: Diagnosis present

## 2021-10-30 DIAGNOSIS — N132 Hydronephrosis with renal and ureteral calculous obstruction: Secondary | ICD-10-CM | POA: Diagnosis not present

## 2021-10-30 DIAGNOSIS — N186 End stage renal disease: Secondary | ICD-10-CM | POA: Diagnosis present

## 2021-10-30 DIAGNOSIS — C189 Malignant neoplasm of colon, unspecified: Secondary | ICD-10-CM | POA: Diagnosis not present

## 2021-10-30 DIAGNOSIS — A491 Streptococcal infection, unspecified site: Secondary | ICD-10-CM | POA: Diagnosis not present

## 2021-10-30 DIAGNOSIS — D631 Anemia in chronic kidney disease: Secondary | ICD-10-CM | POA: Diagnosis present

## 2021-10-30 DIAGNOSIS — I272 Pulmonary hypertension, unspecified: Secondary | ICD-10-CM | POA: Diagnosis present

## 2021-10-30 DIAGNOSIS — E1322 Other specified diabetes mellitus with diabetic chronic kidney disease: Secondary | ICD-10-CM | POA: Diagnosis present

## 2021-10-30 DIAGNOSIS — I2510 Atherosclerotic heart disease of native coronary artery without angina pectoris: Secondary | ICD-10-CM | POA: Diagnosis present

## 2021-10-30 DIAGNOSIS — R509 Fever, unspecified: Secondary | ICD-10-CM | POA: Diagnosis present

## 2021-10-30 DIAGNOSIS — A408 Other streptococcal sepsis: Secondary | ICD-10-CM | POA: Diagnosis present

## 2021-10-30 DIAGNOSIS — A419 Sepsis, unspecified organism: Secondary | ICD-10-CM | POA: Diagnosis not present

## 2021-10-30 DIAGNOSIS — I959 Hypotension, unspecified: Secondary | ICD-10-CM | POA: Diagnosis present

## 2021-10-30 DIAGNOSIS — Z79899 Other long term (current) drug therapy: Secondary | ICD-10-CM | POA: Diagnosis not present

## 2021-10-30 DIAGNOSIS — I509 Heart failure, unspecified: Secondary | ICD-10-CM | POA: Diagnosis not present

## 2021-10-30 DIAGNOSIS — Z8673 Personal history of transient ischemic attack (TIA), and cerebral infarction without residual deficits: Secondary | ICD-10-CM | POA: Diagnosis not present

## 2021-10-30 DIAGNOSIS — A401 Sepsis due to streptococcus, group B: Secondary | ICD-10-CM | POA: Diagnosis not present

## 2021-10-30 DIAGNOSIS — R6510 Systemic inflammatory response syndrome (SIRS) of non-infectious origin without acute organ dysfunction: Secondary | ICD-10-CM

## 2021-10-30 DIAGNOSIS — E1300 Other specified diabetes mellitus with hyperosmolarity without nonketotic hyperglycemic-hyperosmolar coma (NKHHC): Secondary | ICD-10-CM

## 2021-10-30 DIAGNOSIS — I132 Hypertensive heart and chronic kidney disease with heart failure and with stage 5 chronic kidney disease, or end stage renal disease: Secondary | ICD-10-CM | POA: Diagnosis present

## 2021-10-30 DIAGNOSIS — N2581 Secondary hyperparathyroidism of renal origin: Secondary | ICD-10-CM | POA: Diagnosis present

## 2021-10-30 DIAGNOSIS — Z90411 Acquired partial absence of pancreas: Secondary | ICD-10-CM | POA: Diagnosis not present

## 2021-10-30 DIAGNOSIS — Z794 Long term (current) use of insulin: Secondary | ICD-10-CM | POA: Diagnosis not present

## 2021-10-30 DIAGNOSIS — N151 Renal and perinephric abscess: Secondary | ICD-10-CM | POA: Diagnosis present

## 2021-10-30 DIAGNOSIS — E109 Type 1 diabetes mellitus without complications: Secondary | ICD-10-CM | POA: Diagnosis not present

## 2021-10-30 DIAGNOSIS — N1330 Unspecified hydronephrosis: Secondary | ICD-10-CM | POA: Diagnosis not present

## 2021-10-30 DIAGNOSIS — E872 Acidosis, unspecified: Secondary | ICD-10-CM | POA: Diagnosis present

## 2021-10-30 DIAGNOSIS — E891 Postprocedural hypoinsulinemia: Secondary | ICD-10-CM | POA: Diagnosis present

## 2021-10-30 DIAGNOSIS — I5032 Chronic diastolic (congestive) heart failure: Secondary | ICD-10-CM | POA: Diagnosis present

## 2021-10-30 DIAGNOSIS — N1339 Other hydronephrosis: Secondary | ICD-10-CM | POA: Diagnosis not present

## 2021-10-30 DIAGNOSIS — R6520 Severe sepsis without septic shock: Secondary | ICD-10-CM | POA: Diagnosis present

## 2021-10-30 DIAGNOSIS — Z992 Dependence on renal dialysis: Secondary | ICD-10-CM | POA: Diagnosis not present

## 2021-10-30 DIAGNOSIS — C642 Malignant neoplasm of left kidney, except renal pelvis: Secondary | ICD-10-CM | POA: Diagnosis present

## 2021-10-30 DIAGNOSIS — Z7901 Long term (current) use of anticoagulants: Secondary | ICD-10-CM | POA: Diagnosis not present

## 2021-10-30 LAB — COMPREHENSIVE METABOLIC PANEL
ALT: 16 U/L (ref 0–44)
AST: 15 U/L (ref 15–41)
Albumin: 2.2 g/dL — ABNORMAL LOW (ref 3.5–5.0)
Alkaline Phosphatase: 62 U/L (ref 38–126)
Anion gap: 13 (ref 5–15)
BUN: 29 mg/dL — ABNORMAL HIGH (ref 8–23)
CO2: 27 mmol/L (ref 22–32)
Calcium: 8.7 mg/dL — ABNORMAL LOW (ref 8.9–10.3)
Chloride: 99 mmol/L (ref 98–111)
Creatinine, Ser: 4.11 mg/dL — ABNORMAL HIGH (ref 0.44–1.00)
GFR, Estimated: 10 mL/min — ABNORMAL LOW (ref >60)
Glucose, Bld: 271 mg/dL — ABNORMAL HIGH (ref 70–99)
Potassium: 4.2 mmol/L (ref 3.5–5.1)
Sodium: 139 mmol/L (ref 135–145)
Total Bilirubin: 0.7 mg/dL (ref 0.3–1.2)
Total Protein: 6.3 g/dL — ABNORMAL LOW (ref 6.5–8.1)

## 2021-10-30 LAB — BLOOD CULTURE ID PANEL (REFLEXED) - BCID2

## 2021-10-30 LAB — CBC WITH DIFFERENTIAL/PLATELET
Abs Immature Granulocytes: 0.08 10*3/uL — ABNORMAL HIGH (ref 0.00–0.07)
Basophils Absolute: 0 10*3/uL (ref 0.0–0.1)
Basophils Relative: 0 %
Eosinophils Absolute: 0.2 10*3/uL (ref 0.0–0.5)
Eosinophils Relative: 2 %
HCT: 26.3 % — ABNORMAL LOW (ref 36.0–46.0)
Hemoglobin: 8 g/dL — ABNORMAL LOW (ref 12.0–15.0)
Immature Granulocytes: 1 %
Lymphocytes Relative: 9 %
Lymphs Abs: 1.1 10*3/uL (ref 0.7–4.0)
MCH: 27.7 pg (ref 26.0–34.0)
MCHC: 30.4 g/dL (ref 30.0–36.0)
MCV: 91 fL (ref 80.0–100.0)
Monocytes Absolute: 0.8 10*3/uL (ref 0.1–1.0)
Monocytes Relative: 7 %
Neutro Abs: 9.4 10*3/uL — ABNORMAL HIGH (ref 1.7–7.7)
Neutrophils Relative %: 81 %
Platelets: 326 10*3/uL (ref 150–400)
RBC: 2.89 MIL/uL — ABNORMAL LOW (ref 3.87–5.11)
RDW: 17.3 % — ABNORMAL HIGH (ref 11.5–15.5)
WBC: 11.5 10*3/uL — ABNORMAL HIGH (ref 4.0–10.5)
nRBC: 0 % (ref 0.0–0.2)

## 2021-10-30 LAB — GLUCOSE, CAPILLARY
Glucose-Capillary: 160 mg/dL — ABNORMAL HIGH (ref 70–99)
Glucose-Capillary: 163 mg/dL — ABNORMAL HIGH (ref 70–99)
Glucose-Capillary: 185 mg/dL — ABNORMAL HIGH (ref 70–99)
Glucose-Capillary: 196 mg/dL — ABNORMAL HIGH (ref 70–99)
Glucose-Capillary: 270 mg/dL — ABNORMAL HIGH (ref 70–99)

## 2021-10-30 LAB — HEPATITIS B SURFACE ANTIGEN: Hepatitis B Surface Ag: NONREACTIVE

## 2021-10-30 LAB — HEPATITIS B SURFACE ANTIBODY,QUALITATIVE: Hep B S Ab: NONREACTIVE

## 2021-10-30 LAB — LACTIC ACID, PLASMA: Lactic Acid, Venous: 2.2 mmol/L (ref 0.5–1.9)

## 2021-10-30 LAB — HEPATITIS B CORE ANTIBODY, TOTAL: Hep B Core Total Ab: NONREACTIVE

## 2021-10-30 LAB — MAGNESIUM: Magnesium: 2.2 mg/dL (ref 1.7–2.4)

## 2021-10-30 LAB — HEPATITIS C ANTIBODY: HCV Ab: NONREACTIVE

## 2021-10-30 LAB — PROCALCITONIN: Procalcitonin: 0.49 ng/mL

## 2021-10-30 MED ORDER — CALCITRIOL 0.5 MCG PO CAPS
1.2500 ug | ORAL_CAPSULE | ORAL | Status: DC
  Administered 2021-11-03: 1.25 ug via ORAL
  Filled 2021-10-30 (×2): qty 1

## 2021-10-30 MED ORDER — PROSOURCE PLUS PO LIQD
30.0000 mL | Freq: Two times a day (BID) | ORAL | Status: DC
  Administered 2021-10-30 – 2021-11-04 (×6): 30 mL via ORAL
  Filled 2021-10-30 (×6): qty 30

## 2021-10-30 MED ORDER — DARBEPOETIN ALFA 150 MCG/0.3ML IJ SOSY
150.0000 ug | PREFILLED_SYRINGE | INTRAMUSCULAR | Status: DC
  Administered 2021-10-31: 150 ug via INTRAVENOUS
  Filled 2021-10-30 (×2): qty 0.3

## 2021-10-30 MED ORDER — PIPERACILLIN-TAZOBACTAM IN DEX 2-0.25 GM/50ML IV SOLN
2.2500 g | Freq: Three times a day (TID) | INTRAVENOUS | Status: DC
  Administered 2021-10-30 – 2021-11-01 (×7): 2.25 g via INTRAVENOUS
  Filled 2021-10-30 (×10): qty 50

## 2021-10-30 NOTE — Progress Notes (Signed)
PHARMACY - PHYSICIAN COMMUNICATION CRITICAL VALUE ALERT - BLOOD CULTURE IDENTIFICATION (BCID)  Kim Burns is an 86 y.o. female who presented to [facility] on 10/29/2021 with a chief complaint of intermittent fever   Assessment:  2/4 bottles (one set) with streptococcus species  Name of physician (or Provider) Contacted: Dr. Rodena Piety  Current antibiotics: Zosyn 2.25 g IV q8h   Changes to prescribed antibiotics recommended:  Patient is on recommended antibiotics - No changes needed  Results for orders placed or performed during the hospital encounter of 10/29/21  Blood Culture ID Panel (Reflexed) (Collected: 10/29/2021  7:16 PM)  Result Value Ref Range   Enterococcus faecalis NOT DETECTED NOT DETECTED   Enterococcus Faecium NOT DETECTED NOT DETECTED   Listeria monocytogenes NOT DETECTED NOT DETECTED   Staphylococcus species NOT DETECTED NOT DETECTED   Staphylococcus aureus (BCID) NOT DETECTED NOT DETECTED   Staphylococcus epidermidis NOT DETECTED NOT DETECTED   Staphylococcus lugdunensis NOT DETECTED NOT DETECTED   Streptococcus species DETECTED (A) NOT DETECTED   Streptococcus agalactiae NOT DETECTED NOT DETECTED   Streptococcus pneumoniae NOT DETECTED NOT DETECTED   Streptococcus pyogenes NOT DETECTED NOT DETECTED   A.calcoaceticus-baumannii NOT DETECTED NOT DETECTED   Bacteroides fragilis NOT DETECTED NOT DETECTED   Enterobacterales NOT DETECTED NOT DETECTED   Enterobacter cloacae complex NOT DETECTED NOT DETECTED   Escherichia coli NOT DETECTED NOT DETECTED   Klebsiella aerogenes NOT DETECTED NOT DETECTED   Klebsiella oxytoca NOT DETECTED NOT DETECTED   Klebsiella pneumoniae NOT DETECTED NOT DETECTED   Proteus species NOT DETECTED NOT DETECTED   Salmonella species NOT DETECTED NOT DETECTED   Serratia marcescens NOT DETECTED NOT DETECTED   Haemophilus influenzae NOT DETECTED NOT DETECTED   Neisseria meningitidis NOT DETECTED NOT DETECTED   Pseudomonas aeruginosa NOT  DETECTED NOT DETECTED   Stenotrophomonas maltophilia NOT DETECTED NOT DETECTED   Candida albicans NOT DETECTED NOT DETECTED   Candida auris NOT DETECTED NOT DETECTED   Candida glabrata NOT DETECTED NOT DETECTED   Candida krusei NOT DETECTED NOT DETECTED   Candida parapsilosis NOT DETECTED NOT DETECTED   Candida tropicalis NOT DETECTED NOT DETECTED   Cryptococcus neoformans/gattii NOT DETECTED NOT DETECTED    Eliseo Gum, PharmD PGY1 Pharmacy Resident   10/30/2021  2:50 PM

## 2021-10-30 NOTE — Assessment & Plan Note (Signed)
   Receives hemodialysis Monday Wednesday Friday  We will consult nephrology for resumption of hemodialysis while hospitalized

## 2021-10-30 NOTE — Consult Note (Signed)
Patient known to me from clinic, history of renal mass s/p left renal mass ablation on July 5th.  She's had malaise and low grade fevers since the procedure according to the patient.  This all came to a head after she completed dialysis Monday and became very week and febrile.  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  O:  Feels okay today, just weak.  No complaints of pain. S:  Vitals:   10/30/21 0010 10/30/21 0648 10/30/21 0902 10/30/21 1607  BP: 128/64 130/66 (!) 150/78 134/70  Pulse: 70 73 74 74  Resp: '18 18 18 20  '$ Temp: 98.4 F (36.9 C) 98.4 F (36.9 C) 98.4 F (36.9 C)   TempSrc:  Oral Oral   SpO2: 100% 100% 98% 98%  Weight:      Height:        Intake/Output Summary (Last 24 hours) at 10/30/2021 1628 Last data filed at 10/30/2021 0600 Gross per 24 hour  Intake 242.12 ml  Output 0 ml  Net 242.12 ml   NAD Non-labored breathing Abdomen soft Ext Symmetric Recent Labs    10/29/21 1709 10/30/21 0226  WBC 13.2* 11.5*  HGB 8.4* 8.0*  HCT 28.3* 26.3*   Recent Labs    10/29/21 1709 10/30/21 0226  NA 137 139  K 4.1 4.2  CL 97* 99  CO2 28 27  GLUCOSE 343* 271*  BUN 20 29*  CREATININE 3.38* 4.11*  CALCIUM 8.6* 8.7*   Recent Labs    10/29/21 1709  INR 1.4*   No results for input(s): "PSA" in the last 72 hours. No results for input(s): "LABURIN" in the last 72 hours. Results for orders placed or performed during the hospital encounter of 10/29/21  Culture, blood (Routine x 2)     Status: None (Preliminary result)   Collection Time: 10/29/21  5:10 PM   Specimen: BLOOD  Result Value Ref Range Status   Specimen Description BLOOD SITE NOT SPECIFIED  Final   Special Requests   Final    BOTTLES DRAWN AEROBIC AND ANAEROBIC Blood Culture adequate volume   Culture   Final    NO GROWTH < 24 HOURS Performed at [facility], [address] [place], [place] [address]     Report Status PENDING  Incomplete  SARS Coronavirus 2 by RT PCR (hospital order, performed in [facility] hospital lab) *cepheid single result test* Anterior Nasal Swab     Status: None   Collection Time: 10/29/21  7:00 PM   Specimen: Anterior Nasal Swab  Result Value Ref Range Status   SARS Coronavirus 2 by RT PCR NEGATIVE NEGATIVE Final    Comment: (NOTE) SARS-CoV-2 target nucleic acids are NOT DETECTED.  The SARS-CoV-2 RNA is generally detectable in upper and lower respiratory specimens during the acute phase of infection. The lowest concentration of SARS-CoV-2 viral copies this assay can detect is 250 copies / mL. A negative result does not preclude SARS-CoV-2 infection and should not be used as the sole basis for treatment or other patient management decisions.  A negative result may occur with improper specimen collection / handling, submission of specimen other than nasopharyngeal swab, presence of viral mutation(s) within the areas targeted by this assay, and inadequate number of viral copies (<250 copies / mL). A negative result must be combined with clinical observations, patient history, and epidemiological information.  Fact Sheet for Patients:   https://www.patel.info/  Fact Sheet for Healthcare Providers: https://hall.com/  This test is not yet approved or  cleared by the [place] FDA and has been authorized for detection and/or diagnosis of SARS-CoV-2 by FDA under an Emergency Use Authorization (EUA).  This EUA will remain in effect (meaning this test can be used) for the duration of the COVID-19 declaration under Section 564(b)(1) of the Act, 21 U.S.C. section 360bbb-3(b)(1), unless the authorization is terminated or revoked sooner.  Performed at [facility], [place] [address], [place], [place] [address]   Culture, blood (Routine x 2)     Status: None (Preliminary result)   Collection Time: 10/29/21  7:16 PM    Specimen: BLOOD  Result Value Ref Range Status   Specimen Description BLOOD RIGHT ANTECUBITAL  Final   Special Requests   Final    BOTTLES DRAWN AEROBIC AND ANAEROBIC Blood Culture results may not be optimal due to an excessive volume of blood received in culture bottles   Culture  Setup Time   Final    GRAM POSITIVE COCCI IN BOTH AEROBIC AND ANAEROBIC BOTTLES CRITICAL RESULT CALLED TO, READ BACK BY AND VERIFIED WITH: PHARMD C.JACKSON AT 1430 ON 10/30/2021 BY T.SAAD. Performed at [facility], [address] [place], [place] [address]    Culture GRAM POSITIVE COCCI  Final   Report Status PENDING  Incomplete  Blood Culture ID Panel (Reflexed)     Status: Abnormal   Collection Time: 10/29/21  7:16 PM  Result Value Ref Range Status   Enterococcus faecalis NOT DETECTED NOT DETECTED Final   Enterococcus Faecium NOT DETECTED NOT DETECTED Final   Listeria monocytogenes NOT DETECTED NOT DETECTED Final   Staphylococcus species NOT DETECTED NOT DETECTED Final   Staphylococcus aureus (BCID) NOT DETECTED NOT DETECTED Final   Staphylococcus epidermidis NOT DETECTED NOT DETECTED Final   Staphylococcus lugdunensis NOT DETECTED NOT DETECTED Final   Streptococcus species DETECTED (A) NOT DETECTED Final    Comment: Not Enterococcus species, Streptococcus agalactiae, Streptococcus pyogenes, or Streptococcus pneumoniae. CRITICAL RESULT CALLED TO, READ BACK BY AND VERIFIED WITH: PHARMD C.JACKSON AT 1430 ON 10/30/2021 BY T.SAAD.    Streptococcus agalactiae NOT DETECTED NOT DETECTED Final   Streptococcus pneumoniae NOT DETECTED NOT DETECTED Final   Streptococcus pyogenes NOT DETECTED NOT DETECTED Final   A.calcoaceticus-baumannii NOT DETECTED NOT DETECTED Final   Bacteroides fragilis NOT DETECTED NOT DETECTED Final   Enterobacterales NOT DETECTED NOT DETECTED Final   Enterobacter cloacae complex NOT DETECTED NOT DETECTED Final   Escherichia coli NOT DETECTED NOT DETECTED Final   Klebsiella  aerogenes NOT DETECTED NOT DETECTED Final   Klebsiella oxytoca NOT DETECTED NOT DETECTED Final   Klebsiella pneumoniae NOT DETECTED NOT DETECTED Final   Proteus species NOT DETECTED NOT DETECTED Final   Salmonella species NOT DETECTED NOT DETECTED Final   Serratia marcescens NOT DETECTED NOT DETECTED Final   Haemophilus influenzae NOT DETECTED NOT DETECTED Final   Neisseria meningitidis NOT DETECTED NOT DETECTED Final   Pseudomonas aeruginosa NOT DETECTED NOT DETECTED Final   Stenotrophomonas maltophilia NOT DETECTED NOT DETECTED Final   Candida albicans NOT DETECTED NOT DETECTED Final   Candida auris NOT DETECTED NOT DETECTED Final   Candida glabrata NOT DETECTED NOT DETECTED Final   Candida krusei NOT DETECTED NOT DETECTED Final   Candida parapsilosis NOT DETECTED NOT DETECTED Final   Candida tropicalis NOT DETECTED NOT DETECTED Final   Cryptococcus neoformans/gattii NOT DETECTED NOT DETECTED Final    Comment: Performed at [facility], [place] [address] [place], [place] [address]     Imaging - I have independently reviewed the patients CT scan - she has left hydronephrosis and a peri-nephric fluid collection.  I discussed this with the patient  and drew her a picture explaining it.  Imp:  Likely urine leak from thermal injury during renal mass ablation. Plan: Will plan to take to OR  - tomorrow at 3:30, for evaluation of left upper urinary tract with placement of a left ureteral stent. Please make her NPO p MN.

## 2021-10-30 NOTE — Progress Notes (Signed)
Mobility Specialist Progress Note:   10/30/21 0929  Mobility  Activity Ambulated with assistance in hallway  Level of Assistance Contact guard assist, steadying assist  Assistive Device Front wheel walker  Distance Ambulated (ft) 60 ft  Activity Response Tolerated well  $Mobility charge 1 Mobility   Pt eager for mobility session. Required only minG throughout session for safety. Pt displaying steady gait throughout. Left in chair with all needs met, eating breakfast.   [place] or Office Phone: [phone]

## 2021-10-30 NOTE — Assessment & Plan Note (Signed)
   Currently rate controlled.  Continue home regimen of anticoagulation  Continue home regimen of rate controlling agent  Monitoring on telemetry

## 2021-10-30 NOTE — Assessment & Plan Note (Signed)
   Notable lactic acidosis, possibly secondary to underlying infection  Avoiding aggressive intravenous volume resuscitation due to end-stage renal disease  Performing serial lactic acid levels to ensure downtrending and resolution

## 2021-10-30 NOTE — Anesthesia Preprocedure Evaluation (Addendum)
Anesthesia Evaluation  Patient identified by MRN, date of birth, ID band Patient awake    Reviewed: Allergy & Precautions, NPO status , Patient's Chart, lab work & pertinent test results, reviewed documented beta blocker date and time   History of Anesthesia Complications (+) PONV and history of anesthetic complications  Airway Mallampati: II  TM Distance: >3 FB Neck ROM: Full    Dental  (+) Teeth Intact, Dental Advisory Given   Pulmonary sleep apnea (unable to tolerate CPAP) ,    breath sounds clear to auscultation       Cardiovascular hypertension, Pt. on medications and Pt. on home beta blockers pulmonary hypertension (mod pHTN on echo 08/2021)+ CAD and +CHF (normal LVEF, grade 3 diastolic dysfunction)  + dysrhythmias (eliquis) Atrial Fibrillation + Valvular Problems/Murmurs (mild MR, mild-mod TR) MR  Rhythm:Regular Rate:Normal  Echo 08/2021: 1. Right ventricular systolic function is moderately reduced. The right ventricular size is mildly enlarged. There is moderately elevated pulmonary artery systolic pressure. The estimated right ventricular systolic pressure is 46.1 mmHg. Normal apical function with free wall hypokinesis, consistent with McConnell's sign as  can be seen in acute PE (image 58). Consider evaluation for PE.  2. Left ventricular ejection fraction, by estimation, is 60 to 65%. The left ventricle has normal function. The left ventricle has no regional wall motion abnormalities. Left ventricular diastolic parameters are consistent with Grade III diastolic dysfunction (restrictive). Elevated left atrial pressure.  3. Left atrial size was severely dilated.  4. Right atrial size was severely dilated.  5. The mitral valve is degenerative. Mild mitral valve regurgitation. No evidence of mitral stenosis. Moderate mitral annular calcification.  6. Tricuspid valve regurgitation is mild to moderate.  7. The aortic valve is  tricuspid. Aortic valve regurgitation is not visualized. Aortic valve sclerosis is present, with no evidence of aortic valve stenosis.  8. The inferior vena cava is dilated in size with <50% respiratory variability, suggesting right atrial pressure of 15 mmHg.    Neuro/Psych  Headaches, PSYCHIATRIC DISORDERS Anxiety    GI/Hepatic Neg liver ROS, GERD  ,  Endo/Other  diabetes, Well Controlled, Type 2, Insulin DependentLast a1c 6.2  Renal/GU ESRF and DialysisRenal diseaseHx L renal cell carcinoma (s/p L renal bland embolization and microwave ablation on 08/22/21 by IR) who was admitted with possible L kidney abscess and weakness  Last HD yesterday 10/29/21, hypotension after HD K this AM 4.2  negative genitourinary   Musculoskeletal negative musculoskeletal ROS (+)   Abdominal Normal abdominal exam  (+)   Peds  Hematology  (+) Blood dyscrasia, anemia , Hb 8    Anesthesia Other Findings   Reproductive/Obstetrics negative OB ROS                           Anesthesia Physical Anesthesia Plan  ASA: 4 and emergent  Anesthesia Plan: General   Post-op Pain Management: Ofirmev IV (intra-op)*   Induction: Intravenous  PONV Risk Score and Plan: 4 or greater and Ondansetron, Dexamethasone and Treatment may vary due to age or medical condition  Airway Management Planned: Oral ETT  Additional Equipment:   Intra-op Plan:   Post-operative Plan: Possible Post-op intubation/ventilation  Informed Consent: I have reviewed the patients History and Physical, chart, labs and discussed the procedure including the risks, benefits and alternatives for the proposed anesthesia with the patient or authorized representative who has indicated his/her understanding and acceptance.     Dental advisory given  Plan Discussed with:  CRNA  Anesthesia Plan Comments:         Anesthesia Quick Evaluation

## 2021-10-30 NOTE — Progress Notes (Signed)
New Admission Note:  Arrival Method: Stretcher from ED Mental Orientation: A&Ox4 Telemetry: Box # 21 Assessment: Completed Skin: Intact IV: PIV R FA Pain: 0/10 Tubes: None Safety Measures: Safety Fall Prevention Plan was discussed  Admission: Initiated Belongings: Clothing in bag at bedside Unit Orientation: Patient has been oriented to the room, unit, and the staff.  Orders have been reviewed and implemented. Call light has been placed within reach and bed alarm has been activated.   Percell Boston, RN

## 2021-10-30 NOTE — Consult Note (Addendum)
Chief Complaint: Patient was seen in consultation today for left perinephric collection drain placement Chief Complaint  Patient presents with   Abnormal Lab   at the request of Dr Kathreen Cosier; Dr Alfonso Ellis; Dr Louis Meckel   Supervising Physician: Jacqulynn Cadet  Patient Status: [facility] [place] - In-pt  History of Present Illness: Kim Burns is a 86 y.o. female   Hx ESRD; Afib - on Eliquis (LD 9/12 am) CHF; CAD; CVA; Whipple procedure Known to IR Left renal cell carcinoma with left renal ablation performed in IR 08/22/21 She did well post procedure Has come to ED 9/11 with fevers; weakness for weeks Found to have lactic acidosis and Left hydronephrosis Leukocytosis  CT yesterday:IMPRESSION: 1. Patient is status post ablation of mass in the lower pole of the left kidney. Interim organizing heterogenous mass at the lower pole of the left kidney surrounding the renal mass, this demonstrates mostly fat and fluid density and is presumably related to evolving ablation changes. There is no evidence for acute hematoma or active extravasation. There is however interim development of suspected moderate severe hydronephrosis of the left kidney with probable obstruction of the proximal ureter by the changes occurring in the lower pole of the left kidney. 2. History of prior Whipple procedure. No adverse features related to this finding allowing for limited exam due to absence of contrast. 3. Cardiomegaly  Request made for left perinephric collection drain placement Dr Laurence Ferrari has reviewed imaging and approves procedure  Past Medical History:  Diagnosis Date   Anemia of chronic disease    takes iron   Anxiety    Blood transfusion without reported diagnosis    Bradycardia    Diabetes mellitus without complication ([place])    became diabetic after Whipple procedure   Diarrhea    Dysrhythmia 04/16/2016   bradycardia due to medication    ESRD on hemodialysis [facility])    M-W-F   GERD  (gastroesophageal reflux disease)    Gout    Headache    History of kidney stones 06/2013   Hyperparathyroidism ([place])    Hypertension    PONV (postoperative nausea and vomiting)    Sleep apnea    no cpap machine. could not tolerate   Stroke ([place]) 04/16/2016   TIA 1995   Vitamin D deficiency     Past Surgical History:  Procedure Laterality Date   A/V FISTULAGRAM Left 08/30/2021   Procedure: A/V Fistulagram;  Surgeon: Marty Heck, MD;  Location: [facility];  Service: Cardiovascular;  Laterality: Left;   ABDOMINAL HYSTERECTOMY  1985   complete   BACK SURGERY  6712   lower   BASCILIC VEIN TRANSPOSITION Left 04/24/2017   Procedure: LEFT ARM FIRST STAGE BASILIC VEIN TRANSPOSITION;  Surgeon: Serafina Mitchell, MD;  Location: [place];  Service: Vascular;  Laterality: Left;   [place] Left 07/10/2017   Procedure: SECOND STAGE BASILIC VEIN TRANSPOSITION LEFT ARM;  Surgeon: Serafina Mitchell, MD;  Location: [facility];  Service: Vascular;  Laterality: Left;   BREAST LUMPECTOMY Left x 2   many years apart, benign   CHOLECYSTECTOMY  1985   CYSTOSCOPY WITH URETEROSCOPY AND STENT PLACEMENT Left 06/18/2013   Procedure: CYSTOSCOPY WITH Lef URETEROSCOPY AND Left STENT PLACEMENT;  Surgeon: Dutch Gray, MD;  Location: [facility];  Service: Urology;  Laterality: Left;   ESOPHAGOGASTRODUODENOSCOPY (EGD) WITH PROPOFOL N/A 11/13/2016   Procedure: ESOPHAGOGASTRODUODENOSCOPY (EGD) WITH PROPOFOL;  Surgeon: Otis Brace, MD;  Location: [place];  Service: Gastroenterology;  Laterality: N/A;   EUS N/A 02/07/2016   Procedure: ESOPHAGEAL ENDOSCOPIC ULTRASOUND (EUS) RADIAL;  Surgeon: Arta Silence, MD;  Location: [facility];  Service: Endoscopy;  Laterality: N/A;   EYE SURGERY Bilateral 2014   ioc for catracts    FOOT SURGERY Left 1990   something with toes unsure what    HERNIA REPAIR  2018   IR EMBO TUMOR ORGAN ISCHEMIA INFARCT INC GUIDE ROADMAPPING  08/22/2021   IR  RADIOLOGIST EVAL & MGMT  05/18/2021   IR RADIOLOGIST EVAL & MGMT  10/01/2021   IR RENAL SUPRASEL UNI S&I MOD SED  08/22/2021   IR US GUIDE VASC ACCESS RIGHT  08/22/2021   LEFT HEART CATH AND CORONARY ANGIOGRAPHY N/A 08/27/2021   Procedure: LEFT HEART CATH AND CORONARY ANGIOGRAPHY;  Surgeon: Burnell Blanks, MD;  Location: [facility];  Service: Cardiovascular;  Laterality: N/A;   PARATHYROID EXPLORATION     PERIPHERAL VASCULAR BALLOON ANGIOPLASTY Left 08/30/2021   Procedure: PERIPHERAL VASCULAR BALLOON ANGIOPLASTY;  Surgeon: Marty Heck, MD;  Location: [facility];  Service: Cardiovascular;  Laterality: Left;  arm fistula   RADIOLOGY WITH ANESTHESIA Left 08/22/2021   Procedure: MICROWAVE ABLATION;  Surgeon: Sandi Mariscal, MD;  Location: [facility];  Service: Anesthesiology;  Laterality: Left;   WHIPPLE PROCEDURE N/A 04/23/2016   Procedure: WHIPPLE PROCEDURE;  Surgeon: Stark Klein, MD;  Location: [facility];  Service: General;  Laterality: N/A;    Allergies: Lopid [gemfibrozil], Lotemax [loteprednol etabonate], Statins, Norco [hydrocodone-acetaminophen], Other, Tomato, Vibra-tab [doxycycline], Codeine, and Hydrocodone-acetaminophen  Medications: Prior to Admission medications   Medication Sig Start Date End Date Taking? Authorizing Provider  acetaminophen (TYLENOL 8 HOUR ARTHRITIS PAIN) 650 MG CR tablet Take 1,300 mg by mouth daily as needed for pain.   Yes [provider]  amiodarone (PACERONE) 200 MG tablet Take 1 tablet (200 mg total) by mouth daily. Please keep appointment for further refills 10/01/21  Yes Martinique, Peter M, MD  apixaban (ELIQUIS) 2.5 MG TABS tablet Take 1 tablet (2.5 mg total) by mouth 2 (two) times daily. 09/25/21 03/24/22 Yes Martin[place], Peter M, MD  carvedilol (COREG) 3.125 MG tablet Take 3.125 mg by mouth 2 (two) times daily. 10/08/21  Yes [provider]  Cholecalciferol (VITAMIN D-3 PO) Take 1 capsule by mouth every Monday, Wednesday, and Friday  with hemodialysis.   Yes [provider]  colestipol (COLESTID) 1 g tablet Take 1 g by mouth See admin instructions. 1 tablet (1g) prior to dialysis M-W-F + 1 tablet daily PRN diarrhea   Yes [provider]  diclofenac sodium (VOLTAREN) 1 % GEL Apply 2 g topically 4 (four) times daily as needed (for hand pain).    Yes [provider]  diphenhydramine-acetaminophen (TYLENOL PM) 25-500 MG TABS tablet Take 1 tablet by mouth at bedtime as needed (sleep, pain).   Yes [provider]  insulin aspart (NOVOLOG FLEXPEN) 100 UNIT/ML FlexPen Inject 10 Units into the skin 3 (three) times daily as needed (CBG > 200).   Yes [provider]  lidocaine-prilocaine (EMLA) cream Apply 1 Application topically every Monday, Wednesday, and Friday with hemodialysis.   Yes [provider]  lipase/protease/amylase (CREON) 36000 UNITS CPEP capsule Take 1 capsule (36,000 Units total) by mouth with snacks. Patient taking differently: Take 36,000-72,000 Units by mouth See admin instructions. 2 capsules(72000 units) TID with meals AND 1 capsule(36000 units) with snacks 08/30/21 11/28/21 Yes Chatterjee, Kyra Searles, MD  midodrine (PROAMATINE) 10 MG tablet Take 10 mg  by mouth as directed. 09/27/21  Yes [provider]  sevelamer carbonate (RENVELA) 800 MG tablet Take 1,600 mg by mouth 3 (three) times daily with meals.   Yes [provider]  XIIDRA 5 % SOLN Place 1 drop into both eyes at bedtime. 09/24/21  Yes [provider]     Family History  Problem Relation Age of Onset   Heart disease Mother    Stroke Mother    Cancer Father        colon   Alzheimer's disease Sister    Alzheimer's disease Sister    Cancer Brother        brain   Breast cancer Neg Hx     Social History   Socioeconomic History   Marital status: Widowed    Spouse name: Not on file   Number of children: Not on file   Years of education: Not on file   Highest education  level: Not on file  Occupational History   Not on file  Tobacco Use   Smoking status: Never   Smokeless tobacco: Never  Vaping Use   Vaping Use: Never used  Substance and Sexual Activity   Alcohol use: No   Drug use: No   Sexual activity: Not on file  Other Topics Concern   Not on file  Social History Narrative   Not on file   Social Determinants of Health   Financial Resource Strain: Not on file  Food Insecurity: Not on file  Transportation Needs: Not on file  Physical Activity: Not on file  Stress: Not on file  Social Connections: Not on file    Review of Systems: A 12 point ROS discussed and pertinent positives are indicated in the HPI above.  All other systems are negative.  Review of Systems  Constitutional:  Positive for activity change, appetite change, fatigue and fever.  Respiratory:  Negative for cough and shortness of breath.   Cardiovascular:  Negative for chest pain.  Gastrointestinal:  Negative for nausea and vomiting.  Musculoskeletal:  Negative for back pain.  Neurological:  Positive for weakness.  Psychiatric/Behavioral:  Negative for behavioral problems and confusion.     Vital Signs: BP (!) 150/78 (BP Location: Right Arm)   Pulse 74   Temp 98.4 F (36.9 C) (Oral)   Resp 18   Ht '5\' 2"'$  (1.575 m)   Wt 126 lb (57.2 kg)   SpO2 98%   BMI 23.05 kg/m    Physical Exam Vitals reviewed.  HENT:     Mouth/Throat:     Mouth: Mucous membranes are moist.  Cardiovascular:     Rate and Rhythm: Normal rate and regular rhythm.     Heart sounds: Normal heart sounds.  Pulmonary:     Effort: Pulmonary effort is normal.     Breath sounds: Normal breath sounds.  Abdominal:     Palpations: Abdomen is soft.  Musculoskeletal:        General: Normal range of motion.  Skin:    General: Skin is warm.  Neurological:     Mental Status: She is alert and oriented to person, place, and time.  Psychiatric:        Behavior: Behavior normal.     Imaging: CT  Renal Stone Study  Result Date: 10/29/2021 CLINICAL DATA:  Previous renal mass with fever EXAM: CT ABDOMEN AND PELVIS WITHOUT CONTRAST TECHNIQUE: Multidetector CT imaging of the abdomen and pelvis was performed following the standard protocol without IV contrast. RADIATION DOSE REDUCTION:  This exam was performed according to the departmental dose-optimization program which includes automated exposure control, adjustment of the mA and/or kV according to patient size and/or use of iterative reconstruction technique. COMPARISON:  CT 09/06/2021, 08/25/2021, 08/22/2021, 05/03/2021 FINDINGS: Lower chest: Lung bases demonstrate no consolidation or effusion. Minimal mosaic attenuation which could be due to small airways disease. Coronary vascular calcification. Trace air in the anterior right atrium is presumably from line placement. Hepatobiliary: No focal liver abnormality is seen. Status post cholecystectomy. No biliary dilatation. Pancreas: Status post previous Whipple procedure. Atrophy of the distal pancreas without inflammation. Spleen: Normal in size without focal abnormality. Adrenals/Urinary Tract: Adrenal glands are within normal limits. Suspected cortical thinning within the bilateral kidneys. Numerous bilateral cysts and hyperdense renal cortical lesions, further assessment limited without contrast. Suspect that there is moderate severe left hydronephrosis. Embolization material in the left kidney slightly different orientation compared to prior, likely due to mass effect from large process in the lower pole. The patient's left renal mass measures approximately 3.1 cm, coronal series 6, image 46. Rounded area of heterogeneous density surrounding the lower pole renal lesion, there is large fluid component with remainder of mostly fat density, the area measures approximately 7.9 x 7.6 cm on coronal series 6 image 45; this measures 10.3 cm AP on series 3, image 40. It appears more organized compared to prior CT.  The urinary bladder is unremarkable. Stomach/Bowel: The stomach shows postsurgical changes and moderate debris. There is no dilated small bowel. No acute bowel wall thickening. Vascular/Lymphatic: Advanced aortic atherosclerosis. No aneurysm. Subcentimeter retroperitoneal lymph nodes. Reproductive: Status post hysterectomy. No adnexal masses. Other: Negative for pelvic effusion or free air. Numerous phleboliths and pelvic calcifications. Small fat containing ventral hernias. Musculoskeletal: No acute osseous abnormality. IMPRESSION: 1. Patient is status post ablation of mass in the lower pole of the left kidney. Interim organizing heterogenous mass at the lower pole of the left kidney surrounding the renal mass, this demonstrates mostly fat and fluid density and is presumably related to evolving ablation changes. There is no evidence for acute hematoma or active extravasation. There is however interim development of suspected moderate severe hydronephrosis of the left kidney with probable obstruction of the proximal ureter by the changes occurring in the lower pole of the left kidney. 2. History of prior Whipple procedure. No adverse features related to this finding allowing for limited exam due to absence of contrast. 3. Cardiomegaly Electronically Signed   By: Donavan Foil M.D.   On: 10/29/2021 20:31   DG Chest 2 View  Result Date: 10/29/2021 CLINICAL DATA:  Suspected sepsis EXAM: CHEST - 2 VIEW COMPARISON:  09/26/2021, 09/05/2021 FINDINGS: Low lung volumes. No pleural effusion or pneumothorax. No focal consolidation. Borderline cardiac size. Aortic atherosclerosis. Coils in the left upper quadrant. IMPRESSION: No active cardiopulmonary disease.  Hypoventilatory change. Electronically Signed   By: Donavan Foil M.D.   On: 10/29/2021 17:33   IR Radiologist Eval & Mgmt  Result Date: 10/01/2021 Please refer to notes tab for details about interventional procedure. (Op Note)   Labs:  CBC: Recent Labs     09/07/21 0521 09/19/21 1825 10/29/21 1709 10/30/21 0226  WBC 15.7* 7.6 13.2* 11.5*  HGB 8.1* 7.8* 8.4* 8.0*  HCT 26.2* 25.7* 28.3* 26.3*  PLT 503* 325 341 326    COAGS: Recent Labs    08/22/21 0740 09/05/21 2115 10/29/21 1709  INR 0.9 1.3* 1.4*    BMP: Recent Labs    09/07/21 0521 09/19/21 1825 10/29/21 1709  10/30/21 0226  NA 135 138 137 139  K 3.7 3.8 4.1 4.2  CL 98 97* 97* 99  CO2 27 34* 28 27  GLUCOSE 138* 153* 343* 271*  BUN 31* 16 20 29*  CALCIUM 8.7* 8.7* 8.6* 8.7*  CREATININE 4.39* 2.59* 3.38* 4.11*  GFRNONAA 9* 18* 13* 10*    LIVER FUNCTION TESTS: Recent Labs    09/19/21 1825 10/25/21 1056 10/29/21 1709 10/30/21 0226  BILITOT 0.6 0.3 0.6 0.7  AST 34 '15 26 15  '$ ALT 42 '24 19 16  '$ ALKPHOS 89 98 67 62  PROT 6.4* 6.7 6.6 6.3*  ALBUMIN 2.7* 3.5* 2.3* 2.2*    TUMOR MARKERS: No results for input(s): "AFPTM", "CEA", "CA199", "CHROMGRNA" in the last 8760 hours.  Assessment and Plan:  Left perinephric collection S/p left renal lesion ablation in IR 08/22/21 Scheduled for drain placement in IR 9/14 am--- LD Eliquis this am (Now on hold) Risks and benefits discussed with the patient including bleeding, infection, damage to adjacent structure, and sepsis. I also talked with her son [name] via phone-- all questions answered All of the patient's questions were answered, patient is agreeable to proceed. Consent signed and in chart.   Thank you for this interesting consult.  I greatly enjoyed meeting YAREXI PAWLICKI and look forward to participating in their care.  A copy of this report was sent to the requesting provider on this date.  Electronically Signed: Lavonia Drafts, PA-C 10/30/2021, 1:47 PM   I spent a total of 20 Minutes    in face to face in clinical consultation, greater than 50% of which was counseling/coordinating care for left perinephric fluid collection drain

## 2021-10-30 NOTE — Consult Note (Signed)
[facility] Renal Consultation Note    Indication for Consultation:  Management of ESRD/hemodialysis, anemia, hypertension/volume, and secondary hyperparathyroidism. PCP:  HPI: Kim Burns is a 86 y.o. female with ESRD, HTN, HL, CAD, A-fib (on Eliquis since 08/2021), and Hx L renal cell carcinoma (s/p L renal bland embolization and microwave ablation on 08/22/21 by IR) who was admitted with possible L kidney abscess and weakness.  Presented to ED on 10/29/21 evening with severe weakness after dialysis earlier in the day as well as intermittent subjective fevers over the past week. No CP or dyspnea, no abdominal pain, N/V/D. She urinates a very small amount daily - no hematuria, burning, odor. In the ED - vitals were ok, T 98.60F. She was not hypoxic. Labs with Na 139, K 4.2, Ca 8.7, Alb 2.2, LA 2.2 -> 2.2, WBC 11.5, Hgb 8, Gluc 271. CXR clear. Abd CT showing "heterogenous mass at the lower pole of the left kidney surrounding the renal mass, this demonstrates mostly fat and fluid density and is presumably related to evolving ablation changes. There is no evidence for acute hematoma or active extravasation. There is however interim development of suspected moderate severe hydronephrosis of the left kidney with probable obstruction of the proximal ureter by the changes occurring in the lower pole of the left kidney." She was pan-cultured, started on broad-spectrum abx, and admitted.  Seen today in her room. Eating breakfast. Still no CP/dyspnea, abdominal pain, N/V/D. No headache or fever today. Feels a little better today. Urology and IR were consulted - awaiting plan, possible L PCN tube.  Dialyzes on MWF schedule at [facility] unit. She completed her full HD yesterday - complicated by weakness, as above. Uses LUE AVF as her access, no recent issues.  Past Medical History:  Diagnosis Date   Anemia of chronic disease    takes iron   Anxiety    Blood transfusion without reported  diagnosis    Bradycardia    Diabetes mellitus without complication ([place])    became diabetic after Whipple procedure   Diarrhea    Dysrhythmia 04/16/2016   bradycardia due to medication    ESRD on hemodialysis [facility])    M-W-F   GERD (gastroesophageal reflux disease)    Gout    Headache    History of kidney stones 06/2013   Hyperparathyroidism ([place])    Hypertension    PONV (postoperative nausea and vomiting)    Sleep apnea    no cpap machine. could not tolerate   Stroke ([place]) 04/16/2016   TIA 1995   Vitamin D deficiency    Past Surgical History:  Procedure Laterality Date   A/V FISTULAGRAM Left 08/30/2021   Procedure: A/V Fistulagram;  Surgeon: Marty Heck, MD;  Location: [place] [facility];  Service: Cardiovascular;  Laterality: Left;   ABDOMINAL HYSTERECTOMY  1985   complete   BACK SURGERY  1610   lower   BASCILIC VEIN TRANSPOSITION Left 04/24/2017   Procedure: LEFT ARM FIRST STAGE BASILIC VEIN TRANSPOSITION;  Surgeon: Serafina Mitchell, MD;  Location: [place];  Service: Vascular;  Laterality: Left;   [place] Left 07/10/2017   Procedure: SECOND STAGE BASILIC VEIN TRANSPOSITION LEFT ARM;  Surgeon: Serafina Mitchell, MD;  Location: [facility];  Service: Vascular;  Laterality: Left;   BREAST LUMPECTOMY Left x 2   many years apart, benign   CHOLECYSTECTOMY  1985   CYSTOSCOPY WITH URETEROSCOPY AND STENT PLACEMENT Left 06/18/2013   Procedure: CYSTOSCOPY WITH Lef URETEROSCOPY  AND Left STENT PLACEMENT;  Surgeon: Dutch Gray, MD;  Location: [facility];  Service: Urology;  Laterality: Left;   ESOPHAGOGASTRODUODENOSCOPY (EGD) WITH PROPOFOL N/A 11/13/2016   Procedure: ESOPHAGOGASTRODUODENOSCOPY (EGD) WITH PROPOFOL;  Surgeon: Otis Brace, MD;  Location: [place];  Service: Gastroenterology;  Laterality: N/A;   EUS N/A 02/07/2016   Procedure: ESOPHAGEAL ENDOSCOPIC ULTRASOUND (EUS) RADIAL;  Surgeon: Arta Silence, MD;  [facility];  Service: Endoscopy;   Laterality: N/A;   EYE SURGERY Bilateral 2014   ioc for catracts    FOOT SURGERY Left 1990   something with toes unsure what    HERNIA REPAIR  2018   IR EMBO TUMOR ORGAN ISCHEMIA INFARCT INC GUIDE ROADMAPPING  08/22/2021   IR RADIOLOGIST EVAL & MGMT  05/18/2021   IR RADIOLOGIST EVAL & MGMT  10/01/2021   IR RENAL SUPRASEL UNI S&I MOD SED  08/22/2021   IR US GUIDE VASC ACCESS RIGHT  08/22/2021   LEFT HEART CATH AND CORONARY ANGIOGRAPHY N/A 08/27/2021   Procedure: LEFT HEART CATH AND CORONARY ANGIOGRAPHY;  Surgeon: Burnell Blanks, MD;  Location: [place] [facility];  Service: Cardiovascular;  Laterality: N/A;   PARATHYROID EXPLORATION     PERIPHERAL VASCULAR BALLOON ANGIOPLASTY Left 08/30/2021   Procedure: PERIPHERAL VASCULAR BALLOON ANGIOPLASTY;  Surgeon: Marty Heck, MD;  Location: [place] [facility];  Service: Cardiovascular;  Laterality: Left;  arm fistula   RADIOLOGY WITH ANESTHESIA Left 08/22/2021   Procedure: MICROWAVE ABLATION;  Surgeon: Sandi Mariscal, MD;  Location: [facility];  Service: Anesthesiology;  Laterality: Left;   WHIPPLE PROCEDURE N/A 04/23/2016   Procedure: WHIPPLE PROCEDURE;  Surgeon: Stark Klein, MD;  Location: [place];  Service: General;  Laterality: N/A;   Family History  Problem Relation Age of Onset   Heart disease Mother    Stroke Mother    Cancer Father        colon   Alzheimer's disease Sister    Alzheimer's disease Sister    Cancer Brother        brain   Breast cancer Neg Hx    Social History:  reports that she has never smoked. She has never used smokeless tobacco. She reports that she does not drink alcohol and does not use drugs.  ROS: As per HPI otherwise negative.  Physical Exam: Vitals:   10/29/21 2335 10/30/21 0010 10/30/21 0648 10/30/21 0902  BP:  128/64 130/66 (!) 150/78  Pulse:  70 73 74  Resp:  '18 18 18  '$ Temp: 99.2 F (37.3 C) 98.4 F (36.9 C) 98.4 F (36.9 C) 98.4 F (36.9 C)  TempSrc: Oral  Oral Oral  SpO2:  100% 100% 98%   Weight:      Height:         General: Well developed, well nourished, in no acute distress. Room air. Head: Normocephalic, atraumatic, sclera non-icteric, mucus membranes are moist. Neck: Supple without lymphadenopathy/masses. JVD not elevated. Lungs: Clear bilaterally to auscultation without wheezes, rales, or rhonchi.  Heart: RRR; 3/6 murmur Abdomen: Soft, non-tender, non-distended with normoactive bowel sounds.  Musculoskeletal:  Strength and tone appear normal for age. Lower extremities: No edema or ischemic changes, no open wounds. Neuro: Alert and oriented X 3. Moves all extremities spontaneously. Psych:  Responds to questions appropriately with a normal affect. Dialysis Access: LUE AVF + bruit  Allergies  Allergen Reactions   Lopid [Gemfibrozil] Other (See Comments)    Myalgia    Lotemax [Loteprednol Etabonate] Rash    Skin burning  Statins Other (See Comments)    Muscle weakness   Norco [Hydrocodone-Acetaminophen] Other (See Comments)    Headaches  Tolerates acetaminophen    Other Diarrhea    Real Butter - diarrhea   Tomato Other (See Comments)    Belching    Vibra-Tab [Doxycycline] Other (See Comments)    Unknown reaction   Codeine Other (See Comments)    headache   Hydrocodone-Acetaminophen Other (See Comments)    Headache. Tolerates acetaminophen.   Prior to Admission medications   Medication Sig Start Date End Date Taking? Authorizing Provider  acetaminophen (TYLENOL 8 HOUR ARTHRITIS PAIN) 650 MG CR tablet Take 1,300 mg by mouth daily as needed for pain.   Yes [provider]  amiodarone (PACERONE) 200 MG tablet Take 1 tablet (200 mg total) by mouth daily. Please keep appointment for further refills 10/01/21  Yes Martinique, Peter M, MD  apixaban (ELIQUIS) 2.5 MG TABS tablet Take 1 tablet (2.5 mg total) by mouth 2 (two) times daily. 09/25/21 03/24/22 Yes Martinique, Peter M, MD  carvedilol (COREG) 3.125 MG tablet Take 3.125 mg by mouth 2 (two) times daily.  10/08/21  Yes [provider]  Cholecalciferol (VITAMIN D-3 PO) Take 1 capsule by mouth every Monday, Wednesday, and Friday with hemodialysis.   Yes [provider]  colestipol (COLESTID) 1 g tablet Take 1 g by mouth See admin instructions. 1 tablet (1g) prior to dialysis M-W-F + 1 tablet daily PRN diarrhea   Yes [provider]  diclofenac sodium (VOLTAREN) 1 % GEL Apply 2 g topically 4 (four) times daily as needed (for hand pain).    Yes [provider]  diphenhydramine-acetaminophen (TYLENOL PM) 25-500 MG TABS tablet Take 1 tablet by mouth at bedtime as needed (sleep, pain).   Yes [provider]  insulin aspart (NOVOLOG FLEXPEN) 100 UNIT/ML FlexPen Inject 10 Units into the skin 3 (three) times daily as needed (CBG > 200).   Yes [provider]  lidocaine-prilocaine (EMLA) cream Apply 1 Application topically every Monday, Wednesday, and Friday with hemodialysis.   Yes [provider]  lipase/protease/amylase (CREON) 36000 UNITS CPEP capsule Take 1 capsule (36,000 Units total) by mouth with snacks. Patient taking differently: Take 36,000-72,000 Units by mouth See admin instructions. 2 capsules(72000 units) TID with meals AND 1 capsule(36000 units) with snacks 08/30/21 11/28/21 Yes Chatterjee, Kyra Searles, MD  midodrine (PROAMATINE) 10 MG tablet Take 10 mg by mouth as directed. 09/27/21  Yes [provider]  sevelamer carbonate (RENVELA) 800 MG tablet Take 1,600 mg by mouth 3 (three) times daily with meals.   Yes [provider]  XIIDRA 5 % SOLN Place 1 drop into both eyes at bedtime. 09/24/21  Yes [provider]   Current Facility-Administered Medications  Medication Dose Route Frequency Provider Last Rate Last Admin   acetaminophen (TYLENOL) tablet 650 mg  650 mg Oral Q6H PRN Shalhoub, Sherryll Burger, MD       Or   acetaminophen (TYLENOL) suppository 650 mg  650 mg Rectal Q6H PRN Shalhoub, Sherryll Burger, MD        amiodarone (PACERONE) tablet 200 mg  200 mg Oral Daily Shalhoub, Sherryll Burger, MD   200 mg at 10/30/21 0851   apixaban (ELIQUIS) tablet 2.5 mg  2.5 mg Oral BID Vernelle Emerald, MD   2.5 mg at 10/30/21 0851   carvedilol (COREG) tablet 3.125 mg  3.125 mg Oral BID Vernelle Emerald, MD   3.125 mg at 10/30/21 0851   [START  ON 10/31/2021] colestipol (COLESTID) tablet 1 g  1 g Oral Q M,W,F Shalhoub, Sherryll Burger, MD       insulin aspart (novoLOG) injection 0-6 Units  0-6 Units Subcutaneous TID AC & HS Shalhoub, Sherryll Burger, MD   1 Units at 10/30/21 0858   Lifitegrast 5 % SOLN 1 drop  1 drop Both Eyes QHS Shalhoub, Sherryll Burger, MD       lipase/protease/amylase (CREON) capsule 36,000 Units  36,000 Units Oral With snacks Shalhoub, Sherryll Burger, MD       lipase/protease/amylase (CREON) capsule 72,000 Units  72,000 Units Oral TID WC Einar Grad, RPH   72,000 Units at 10/30/21 0850   [START ON 10/31/2021] midodrine (PROAMATINE) tablet 10 mg  10 mg Oral Q M,W,F-HD Shalhoub, Sherryll Burger, MD       ondansetron [facility]) tablet 4 mg  4 mg Oral Q6H PRN Vernelle Emerald, MD       Or   ondansetron [facility]) injection 4 mg  4 mg Intravenous Q6H PRN Shalhoub, Sherryll Burger, MD       piperacillin-tazobactam (ZOSYN) IVPB 2.25 g  2.25 g Intravenous Q8H Einar Grad, RPH 100 mL/hr at 10/30/21 0857 2.25 g at 10/30/21 0857   polyethylene glycol (MIRALAX / GLYCOLAX) packet 17 g  17 g Oral Daily PRN Vernelle Emerald, MD       sevelamer carbonate (RENVELA) tablet 1,600 mg  1,600 mg Oral TID WC Vernelle Emerald, MD   1,600 mg at 10/30/21 0851   sodium chloride flush (NS) 0.9 % injection 3 mL  3 mL Intravenous Q12H Vernelle Emerald, MD   3 mL at 10/30/21 0901   Labs: Basic Metabolic Panel: Recent Labs  Lab 10/29/21 1709 10/30/21 0226  NA 137 139  K 4.1 4.2  CL 97* 99  CO2 28 27  GLUCOSE 343* 271*  BUN 20 29*  CREATININE 3.38* 4.11*  CALCIUM 8.6* 8.7*   Liver Function Tests: Recent Labs  Lab 10/25/21 1056 10/29/21 1709  10/30/21 0226  AST '15 26 15  '$ ALT '24 19 16  '$ ALKPHOS 98 67 62  BILITOT 0.3 0.6 0.7  PROT 6.7 6.6 6.3*  ALBUMIN 3.5* 2.3* 2.2*   CBC: Recent Labs  Lab 10/29/21 1709 10/30/21 0226  WBC 13.2* 11.5*  NEUTROABS 12.0* 9.4*  HGB 8.4* 8.0*  HCT 28.3* 26.3*  MCV 93.1 91.0  PLT 341 326   CBG: Recent Labs  Lab 10/30/21 0010 10/30/21 0832  GLUCAP 196* 160*   Studies/Results: CT Renal Stone Study  Result Date: 10/29/2021 CLINICAL DATA:  Previous renal mass with fever EXAM: CT ABDOMEN AND PELVIS WITHOUT CONTRAST TECHNIQUE: Multidetector CT imaging of the abdomen and pelvis was performed following the standard protocol without IV contrast. RADIATION DOSE REDUCTION: This exam was performed according to the departmental dose-optimization program which includes automated exposure control, adjustment of the mA and/or kV according to patient size and/or use of iterative reconstruction technique. COMPARISON:  CT 09/06/2021, 08/25/2021, 08/22/2021, 05/03/2021 FINDINGS: Lower chest: Lung bases demonstrate no consolidation or effusion. Minimal mosaic attenuation which could be due to small airways disease. Coronary vascular calcification. Trace air in the anterior right atrium is presumably from line placement. Hepatobiliary: No focal liver abnormality is seen. Status post cholecystectomy. No biliary dilatation. Pancreas: Status post previous Whipple procedure. Atrophy of the distal pancreas without inflammation. Spleen: Normal in size without focal abnormality. Adrenals/Urinary Tract: Adrenal glands are within normal limits. Suspected cortical thinning within the bilateral kidneys. Numerous bilateral cysts  and hyperdense renal cortical lesions, further assessment limited without contrast. Suspect that there is moderate severe left hydronephrosis. Embolization material in the left kidney slightly different orientation compared to prior, likely due to mass effect from large process in the lower pole. The  patient's left renal mass measures approximately 3.1 cm, coronal series 6, image 46. Rounded area of heterogeneous density surrounding the lower pole renal lesion, there is large fluid component with remainder of mostly fat density, the area measures approximately 7.9 x 7.6 cm on coronal series 6 image 45; this measures 10.3 cm AP on series 3, image 40. It appears more organized compared to prior CT. The urinary bladder is unremarkable. Stomach/Bowel: The stomach shows postsurgical changes and moderate debris. There is no dilated small bowel. No acute bowel wall thickening. Vascular/Lymphatic: Advanced aortic atherosclerosis. No aneurysm. Subcentimeter retroperitoneal lymph nodes. Reproductive: Status post hysterectomy. No adnexal masses. Other: Negative for pelvic effusion or free air. Numerous phleboliths and pelvic calcifications. Small fat containing ventral hernias. Musculoskeletal: No acute osseous abnormality. IMPRESSION: 1. Patient is status post ablation of mass in the lower pole of the left kidney. Interim organizing heterogenous mass at the lower pole of the left kidney surrounding the renal mass, this demonstrates mostly fat and fluid density and is presumably related to evolving ablation changes. There is no evidence for acute hematoma or active extravasation. There is however interim development of suspected moderate severe hydronephrosis of the left kidney with probable obstruction of the proximal ureter by the changes occurring in the lower pole of the left kidney. 2. History of prior Whipple procedure. No adverse features related to this finding allowing for limited exam due to absence of contrast. 3. Cardiomegaly Electronically Signed   By: Donavan Foil M.D.   On: 10/29/2021 20:31   DG Chest 2 View  Result Date: 10/29/2021 CLINICAL DATA:  Suspected sepsis EXAM: CHEST - 2 VIEW COMPARISON:  09/26/2021, 09/05/2021 FINDINGS: Low lung volumes. No pleural effusion or pneumothorax. No focal  consolidation. Borderline cardiac size. Aortic atherosclerosis. Coils in the left upper quadrant. IMPRESSION: No active cardiopulmonary disease.  Hypoventilatory change. Electronically Signed   By: Donavan Foil M.D.   On: 10/29/2021 17:33    Dialysis Orders:  MWF at [facility] -> full HD 9/11, post-wt 57.3kg 3:45hr, 350/A1.5, EDW 57.5kg, 2K/2Ca bath, LUE AVF, 16g needles, no heparin - Mircera 92mg IV q 2 weeks (last given 9/1) - last Hgb 8.1 on 9/6 - Venofer '50mg'$  IV weekly - Calcitriol 1.246m PO q HD - HBsAg negative on 09/12/21  Assessment/Plan:  L hydronephrosis + ?abscess: Recent coiling embolization of L renal tumor on 08/22/21. Blood and Urine Cx pending. On Zosyn. Awaiting urology and/or IR input.  ESRD:  Continue HD on usual MWF schedule - next tomorrow. BP dropped and felt awful after last HD - will adjust orders for easier treatment with less UF.  BP/volume: Gets midodrine pre-HD, also on BB for A-fib. No edema. Follow.  Anemia of ESRD: Hgb low - 8, was same range last week as outpatient. Due for ESA on 9/15 -> will give a little early tomorrow with dialysis. Transfuse if drops < 7.  Metabolic bone disease: CorrCa ok, Phos pending. Continue home binders + VDRA.  Nutrition:  Alb low, adding supplements while here.  A-fib: Dx in 08/2021, on amiodarone, BB, Eliquis BID. Seems to be in NSR this admit.  KaVeneta PentonPA-C 10/30/2021, 11:28 AM  [employer]

## 2021-10-30 NOTE — Assessment & Plan Note (Signed)
   Uncertain as to if the development of this patient's hydronephrosis is clinically relevant as there is no evidence of associated pyelonephritis on CT, an unremarkable urinalysis and the patient has no symptoms of flank pain or tenderness on exam  Please see notes above  Dr. Cain Sieve with urology is evaluating the patient in consultation, their input is appreciated.

## 2021-10-30 NOTE — Progress Notes (Signed)
Pharmacy Antibiotic Note  Kim Burns is a 86 y.o. female admitted on 10/29/2021 presenting with intermittent fever.  Pharmacy has been consulted for zosyn dosing.  Pt has ESRD on HD.  Plan: Zosyn 2.25g IV q8h    Height: '5\' 2"'$  (157.5 cm) Weight: 57.2 kg (126 lb) IBW/kg (Calculated) : 50.1  Temp (24hrs), Avg:98.6 F (37 C), Min:98 F (36.7 C), Max:99.2 F (37.3 C)  Recent Labs  Lab 10/29/21 1709 10/29/21 2039 10/30/21 0226  WBC 13.2*  --  11.5*  CREATININE 3.38*  --  4.11*  LATICACIDVEN 3.6* 2.2*  --      Estimated Creatinine Clearance: 8.1 mL/min (A) (by C-G formula based on SCr of 4.11 mg/dL (H)).    Allergies  Allergen Reactions   Lopid [Gemfibrozil] Other (See Comments)    Myalgia    Lotemax [Loteprednol Etabonate] Rash    Skin burning    Statins Other (See Comments)    Muscle weakness   Norco [Hydrocodone-Acetaminophen] Other (See Comments)    Headaches  Tolerates acetaminophen    Other Diarrhea    Real Butter - diarrhea   Tomato Other (See Comments)    Belching    Vibra-Tab [Doxycycline] Other (See Comments)    Unknown reaction   Codeine Other (See Comments)    headache   Hydrocodone-Acetaminophen Other (See Comments)    Headache. Tolerates acetaminophen.    Arrie Senate, PharmD, BCPS, [facility] Clinical Pharmacist Please check AMION for all [facility] Pharmacy numbers 10/30/2021

## 2021-10-30 NOTE — Progress Notes (Signed)
PROGRESS NOTE    Kim Burns  FSE:[id number] DOB: 1935-05-11 DOA: 10/29/2021 PCP: [name], MD   Brief Narrative: HPI per Dr. Cyd Silence on 10/29/2021 86 year old female with past medical history of end-stage renal disease (Started HD 02/2021 now MWF), paroxysmal atrial fibrillation (on Eliquis), diastolic congestive heart failure (Echo 08/2021 with EF 60-65%, G3DD) coronary artery disease (cath 08/2021 with nonobstructive disease), prior stroke, left renal cell carcinoma, prior Whipple procedure with secondary insulin-dependent diabetes mellitus, hypertension presenting to [facility] emergency department with complaints of several weeks of fever and weakness.   Of note, concerning patient's renal cell carcinoma of the left kidney patient underwent an ablation procedure by interventional radiology at the recommendation of urology 08/22/2021.  Patient was admitted to the hospital twice in July after that procedure due to recurrent atrial fibrillation with rapid ventricular response.  Patient was found to have a leukocytosis at that time and prompting empiric antibiotic therapy but infectious work-ups were negative.   Patient explains that for approximate the past 3 to 4 weeks she has been experiencing intermittent low-grade fevers with temperatures typically around 100 F.  Over the course of the past 4 weeks patient has developed progressively worsening generalized weakness and lethargy.  Patient denies any cough, shortness of breath, dysuria, abdominal pain, diarrhea, sick contacts, recent travel or contact with confirmed COVID-19 infection.   With progressively worsening symptoms patient went for hemodialysis on 9/10 and after dialysis was so weak when she came home she nearly collapsed.  She was so exhausted that she was unable to get up.  This prompted her to present to [facility] emergency department for evaluation today.   Upon evaluation in the emergency department CT  imaging of the abdomen and pelvis there was apparent interval development of post ablation changes of the left kidney with additional interval development of severe hydronephrosis with obstruction of the proximal ureter.  Patient was additionally found to have a lactic acidosis and leukocytosis and therefore the patient was placed on empiric intravenous antibiotic therapies by the EDP with vancomycin and Zosyn.  EDP discussed case with Dr. Johnney Ou with nephrology who agreed to see the patient in consultation.  EDP additionally discussed the case with Dr. Cain Sieve with urology who also agreed to see the patient in consultation.  The hospitalist group was then called to assess the patient for admission to the hospital.  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  She has been having few weeks of low-grade temp at home prior to admission.  She is status post ablation of left renal mass on 08/22/2021.   Work-up shows CT of the abdomen with fluid collection concerning for?  Abscess versus urine.  IR consulted for drainage of fluid collection measuring 10 cm in the left renal and possible nephrostomy tube. Full report of CT abdomen see above. Severe left hydronephrosis with obstruction of the proximal ureter seen.  On admission she had lactic acidosis and leukocytosis and was empirically placed on vancomycin and Zosyn. Chest x-ray negative UA  no evidence of UTI Procalcitonin elevated  lactic acid 2.2 from 3.6 trend and  monitor Leukocytosis improving  #2 end-stage renal disease on dialysis Monday Wednesday Friday nephrology following.  #3 severe left hydronephrosis new urology recommends IR consult.  #4 diabetes mellitus after Whipple procedure on [place]  #5 left kidney renal cell carcinoma status post embolization of the renal mass 08/22/2021.  #6 paroxysmal atrial fibrillation rate is controlled Continue home dose of amiodarone, Coreg And Eliquis  #7 chronic diastolic heart failure stable  #8 hypotension on midodrine 10 mg on dialysis days.  Estimated body mass index is 23.05 kg/m as calculated from the following:   Height as of this encounter: '5\' 2"'$  (1.575 m).   Weight as of this encounter: 57.2 kg.  DVT prophylaxis:eliquis code Status: Full  family Communication: None at bedside Disposition Plan:  Status is: Observation The patient will require care spanning > 2 midnights and should be moved to inpatient because: IV antibiotics IR consulted for drainage of\left renal fluid collection with severe hydronephrosis   Consultants:  Urology, nephrology  Procedures: None Antimicrobials: Vanco mycin and Zosyn  Subjective: She is resting in bed reports that she is very weak denies any pain She makes very little urine She lives at home with her daughter  Objective: Vitals:   10/29/21 2335 10/30/21 0010 10/30/21 0648 10/30/21 0902  BP:  128/64 130/66 (!) 150/78  Pulse:  70 73 74  Resp:  '18 18 18  '$ Temp: 99.2 F (37.3 C) 98.4 F (36.9 C) 98.4 F (36.9 C) 98.4 F (36.9 C)  TempSrc: Oral  Oral Oral  SpO2:  100% 100% 98%  Weight:      Height:        Intake/Output Summary (Last 24 hours) at 10/30/2021 1023 Last data filed at 10/30/2021 0600 Gross per 24 hour  Intake 242.12 ml  Output 0 ml  Net 242.12 ml   Filed Weights   10/29/21 1632  Weight: 57.2 kg    Examination:  General exam: Appears  in nad  Respiratory system: Clear to auscultation. Respiratory effort  normal. Cardiovascular system: S1 & S2 heard, RRR. No JVD, murmurs, rubs, gallops or clicks. No pedal edema. Gastrointestinal system: Abdomen is nondistended, soft and nontender. No organomegaly or masses felt. Normal bowel sounds heard. Central nervous system: Alert and oriented. No focal neurological deficits. Extremities: No edema Skin: No rashes, lesions or ulcers Psychiatry: Judgement and insight appear normal. Mood & affect appropriate.     Data Reviewed: I have personally reviewed following labs and imaging studies  CBC: Recent Labs  Lab 10/29/21 1709 10/30/21 0226  WBC 13.2* 11.5*  NEUTROABS 12.0* 9.4*  HGB 8.4* 8.0*  HCT 28.3* 26.3*  MCV 93.1 91.0  PLT 341 295   Basic Metabolic Panel: Recent Labs  Lab 10/29/21 1709 10/30/21 0226  NA 137 139  K 4.1 4.2  CL 97* 99  CO2 28 27  GLUCOSE 343* 271*  BUN 20 29*  CREATININE 3.38* 4.11*  CALCIUM 8.6* 8.7*  MG  --  2.2   GFR: Estimated Creatinine Clearance: 8.1 mL/min (A) (by C-G formula based on SCr of 4.11 mg/dL (H)). Liver Function Tests: Recent Labs  Lab 10/25/21 1056 10/29/21 1709 10/30/21 0226  AST '15 26 15  '$ ALT '24 19 16  '$ ALKPHOS 98 67 62  BILITOT 0.3 0.6 0.7  PROT 6.7 6.6 6.3*  ALBUMIN 3.5* 2.3* 2.2*   No results for input(s): "LIPASE", "AMYLASE" in the last 168 hours. No results for  input(s): "AMMONIA" in the last 168 hours. Coagulation Profile: Recent Labs  Lab 10/29/21 1709  INR 1.4*   Cardiac Enzymes: No results for input(s): "CKTOTAL", "CKMB", "CKMBINDEX", "TROPONINI" in the last 168 hours. BNP (last 3 results) No results for input(s): "PROBNP" in the last 8760 hours. HbA1C: No results for input(s): "HGBA1C" in the last 72 hours. CBG: Recent Labs  Lab 10/30/21 0010 10/30/21 0832  GLUCAP 196* 160*   Lipid Profile: No results for input(s): "CHOL", "HDL", "LDLCALC", "TRIG", "CHOLHDL", "LDLDIRECT" in the last 72 hours. Thyroid Function Tests: No results for input(s): "TSH",  "T4TOTAL", "FREET4", "T3FREE", "THYROIDAB" in the last 72 hours. Anemia Panel: No results for input(s): "VITAMINB12", "FOLATE", "FERRITIN", "TIBC", "IRON", "RETICCTPCT" in the last 72 hours. Sepsis Labs: Recent Labs  Lab 10/29/21 1709 10/29/21 2039 10/30/21 0854  PROCALCITON 0.49  --   --   LATICACIDVEN 3.6* 2.2* 2.2*    Recent Results (from the past 240 hour(s))  Culture, blood (Routine x 2)     Status: None (Preliminary result)   Collection Time: 10/29/21  5:10 PM   Specimen: BLOOD  Result Value Ref Range Status   Specimen Description BLOOD SITE NOT SPECIFIED  Final   Special Requests   Final    BOTTLES DRAWN AEROBIC AND ANAEROBIC Blood Culture adequate volume   Culture   Final    NO GROWTH < 24 HOURS Performed at [facility], [place] [address], [place], [place] [address]    Report Status PENDING  Incomplete  SARS Coronavirus 2 by RT PCR (hospital order, performed in [facility] hospital lab) *cepheid single result test* Anterior Nasal Swab     Status: None   Collection Time: 10/29/21  7:00 PM   Specimen: Anterior Nasal Swab  Result Value Ref Range Status   SARS Coronavirus 2 by RT PCR NEGATIVE NEGATIVE Final    Comment: (NOTE) SARS-CoV-2 target nucleic acids are NOT DETECTED.  The SARS-CoV-2 RNA is generally detectable in upper and lower respiratory specimens during the acute phase of infection. The lowest concentration of SARS-CoV-2 viral copies this assay can detect is 250 copies / mL. A negative result does not preclude SARS-CoV-2 infection and should not be used as the sole basis for treatment or other patient management decisions.  A negative result may occur with improper specimen collection / handling, submission of specimen other than nasopharyngeal swab, presence of viral mutation(s) within the areas targeted by this assay, and inadequate number of viral copies (<250 copies / mL). A negative result must be combined with clinical observations, patient  history, and epidemiological information.  Fact Sheet for Patients:   https://www.patel.info/  Fact Sheet for Healthcare Providers: https://hall.com/  This test is not yet approved or  cleared by the [place] FDA and has been authorized for detection and/or diagnosis of SARS-CoV-2 by FDA under an Emergency Use Authorization (EUA).  This EUA will remain in effect (meaning this test can be used) for the duration of the COVID-19 declaration under Section 564(b)(1) of the Act, 21 U.S.C. section 360bbb-3(b)(1), unless the authorization is terminated or revoked sooner.  Performed at [facility], [place] [address] [place], [place] [address]   Culture, blood (Routine x 2)     Status: None (Preliminary result)   Collection Time: 10/29/21  7:16 PM   Specimen: BLOOD  Result Value Ref Range Status   Specimen Description BLOOD RIGHT ANTECUBITAL  Final   Special Requests   Final    BOTTLES DRAWN AEROBIC AND  ANAEROBIC Blood Culture results may not be optimal due to an excessive volume of blood received in culture bottles   Culture   Final    NO GROWTH < 12 HOURS Performed at [place] [address] [place], [place] [address]    Report Status PENDING  Incomplete         Radiology Studies: CT Renal Stone Study  Result Date: 10/29/2021 CLINICAL DATA:  Previous renal mass with fever EXAM: CT ABDOMEN AND PELVIS WITHOUT CONTRAST TECHNIQUE: Multidetector CT imaging of the abdomen and pelvis was performed following the standard protocol without IV contrast. RADIATION DOSE REDUCTION: This exam was performed according to the departmental dose-optimization program which includes automated exposure control, adjustment of the mA and/or kV according to patient size and/or use of iterative reconstruction technique. COMPARISON:  CT 09/06/2021, 08/25/2021, 08/22/2021, 05/03/2021 FINDINGS: Lower chest: Lung bases demonstrate no consolidation or  effusion. Minimal mosaic attenuation which could be due to small airways disease. Coronary vascular calcification. Trace air in the anterior right atrium is presumably from line placement. Hepatobiliary: No focal liver abnormality is seen. Status post cholecystectomy. No biliary dilatation. Pancreas: Status post previous Whipple procedure. Atrophy of the distal pancreas without inflammation. Spleen: Normal in size without focal abnormality. Adrenals/Urinary Tract: Adrenal glands are within normal limits. Suspected cortical thinning within the bilateral kidneys. Numerous bilateral cysts and hyperdense renal cortical lesions, further assessment limited without contrast. Suspect that there is moderate severe left hydronephrosis. Embolization material in the left kidney slightly different orientation compared to prior, likely due to mass effect from large process in the lower pole. The patient's left renal mass measures approximately 3.1 cm, coronal series 6, image 46. Rounded area of heterogeneous density surrounding the lower pole renal lesion, there is large fluid component with remainder of mostly fat density, the area measures approximately 7.9 x 7.6 cm on coronal series 6 image 45; this measures 10.3 cm AP on series 3, image 40. It appears more organized compared to prior CT. The urinary bladder is unremarkable. Stomach/Bowel: The stomach shows postsurgical changes and moderate debris. There is no dilated small bowel. No acute bowel wall thickening. Vascular/Lymphatic: Advanced aortic atherosclerosis. No aneurysm. Subcentimeter retroperitoneal lymph nodes. Reproductive: Status post hysterectomy. No adnexal masses. Other: Negative for pelvic effusion or free air. Numerous phleboliths and pelvic calcifications. Small fat containing ventral hernias. Musculoskeletal: No acute osseous abnormality. IMPRESSION: 1. Patient is status post ablation of mass in the lower pole of the left kidney. Interim organizing  heterogenous mass at the lower pole of the left kidney surrounding the renal mass, this demonstrates mostly fat and fluid density and is presumably related to evolving ablation changes. There is no evidence for acute hematoma or active extravasation. There is however interim development of suspected moderate severe hydronephrosis of the left kidney with probable obstruction of the proximal ureter by the changes occurring in the lower pole of the left kidney. 2. History of prior Whipple procedure. No adverse features related to this finding allowing for limited exam due to absence of contrast. 3. Cardiomegaly Electronically Signed   By: Donavan Foil M.D.   On: 10/29/2021 20:31   DG Chest 2 View  Result Date: 10/29/2021 CLINICAL DATA:  Suspected sepsis EXAM: CHEST - 2 VIEW COMPARISON:  09/26/2021, 09/05/2021 FINDINGS: Low lung volumes. No pleural effusion or pneumothorax. No focal consolidation. Borderline cardiac size. Aortic atherosclerosis. Coils in the left upper quadrant. IMPRESSION: No active cardiopulmonary disease.  Hypoventilatory change. Electronically Signed   By: Maudie Mercury  Francoise Ceo M.D.   On: 10/29/2021 17:33        Scheduled Meds:  amiodarone  200 mg Oral Daily   apixaban  2.5 mg Oral BID   carvedilol  3.125 mg Oral BID   [START ON 10/31/2021] colestipol  1 g Oral Q M,W,F   insulin aspart  0-6 Units Subcutaneous TID AC & HS   Lifitegrast  1 drop Both Eyes QHS   lipase/protease/amylase  36,000 Units Oral With snacks   lipase/protease/amylase  72,000 Units Oral TID WC   [START ON 10/31/2021] midodrine  10 mg Oral Q M,W,F-HD   sevelamer carbonate  1,600 mg Oral TID WC   sodium chloride flush  3 mL Intravenous Q12H   Continuous Infusions:  piperacillin-tazobactam (ZOSYN)  IV 2.25 g (10/30/21 0857)     LOS: 0 days    Time spent: 45 min  Georgette Shell, MD 10/30/2021, 10:23 AM

## 2021-10-30 NOTE — H&P (View-Only) (Signed)
Patient known to me from clinic, history of renal mass s/p left renal mass ablation on July 5th.  She's had malaise and low grade fevers since the procedure according to the patient.  This all came to a head after she completed dialysis Monday and became very week and febrile.  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  O:  Feels okay today, just weak.  No complaints of pain. S:  Vitals:   10/30/21 0010 10/30/21 0648 10/30/21 0902 10/30/21 1607  BP: 128/64 130/66 (!) 150/78 134/70  Pulse: 70 73 74 74  Resp: '18 18 18 20  '$ Temp: 98.4 F (36.9 C) 98.4 F (36.9 C) 98.4 F (36.9 C)   TempSrc:  Oral Oral   SpO2: 100% 100% 98% 98%  Weight:      Height:        Intake/Output Summary (Last 24 hours) at 10/30/2021 1628 Last data filed at 10/30/2021 0600 Gross per 24 hour  Intake 242.12 ml  Output 0 ml  Net 242.12 ml   NAD Non-labored breathing Abdomen soft Ext Symmetric Recent Labs    10/29/21 1709 10/30/21 0226  WBC 13.2* 11.5*  HGB 8.4* 8.0*  HCT 28.3* 26.3*   Recent Labs    10/29/21 1709 10/30/21 0226  NA 137 139  K 4.1 4.2  CL 97* 99  CO2 28 27  GLUCOSE 343* 271*  BUN 20 29*  CREATININE 3.38* 4.11*  CALCIUM 8.6* 8.7*   Recent Labs    10/29/21 1709  INR 1.4*   No results for input(s): "PSA" in the last 72 hours. No results for input(s): "LABURIN" in the last 72 hours. Results for orders placed or performed during the hospital encounter of 10/29/21  Culture, blood (Routine x 2)     Status: None (Preliminary result)   Collection Time: 10/29/21  5:10 PM   Specimen: BLOOD  Result Value Ref Range Status   Specimen Description BLOOD SITE NOT SPECIFIED  Final   Special Requests   Final    BOTTLES DRAWN AEROBIC AND ANAEROBIC Blood Culture adequate volume   Culture   Final    NO GROWTH < 24 HOURS Performed at [facility], [address], [place], [place] [address]     Report Status PENDING  Incomplete  SARS Coronavirus 2 by RT PCR (hospital order, performed in [facility] hospital lab) *cepheid single result test* Anterior Nasal Swab     Status: None   Collection Time: 10/29/21  7:00 PM   Specimen: Anterior Nasal Swab  Result Value Ref Range Status   SARS Coronavirus 2 by RT PCR NEGATIVE NEGATIVE Final    Comment: (NOTE) SARS-CoV-2 target nucleic acids are NOT DETECTED.  The SARS-CoV-2 RNA is generally detectable in upper and lower respiratory specimens during the acute phase of infection. The lowest concentration of SARS-CoV-2 viral copies this assay can detect is 250 copies / mL. A negative result does not preclude SARS-CoV-2 infection and should not be used as the sole basis for treatment or other patient management decisions.  A negative result may occur with improper specimen collection / handling, submission of specimen other than nasopharyngeal swab, presence of viral mutation(s) within the areas targeted by this assay, and inadequate number of viral copies (<250 copies / mL). A negative result must be combined with clinical observations, patient history, and epidemiological information.  Fact Sheet for Patients:   https://www.patel.info/  Fact Sheet for Healthcare Providers: https://hall.com/  This test is not yet approved or  cleared by the [place] FDA and has been authorized for detection and/or diagnosis of SARS-CoV-2 by FDA under an Emergency Use Authorization (EUA).  This EUA will remain in effect (meaning this test can be used) for the duration of the COVID-19 declaration under Section 564(b)(1) of the Act, 21 U.S.C. section 360bbb-3(b)(1), unless the authorization is terminated or revoked sooner.  Performed at [facility], [place] [address] [place], [place] [address]   Culture, blood (Routine x 2)     Status: None (Preliminary result)   Collection Time: 10/29/21  7:16 PM    Specimen: BLOOD  Result Value Ref Range Status   Specimen Description BLOOD RIGHT ANTECUBITAL  Final   Special Requests   Final    BOTTLES DRAWN AEROBIC AND ANAEROBIC Blood Culture results may not be optimal due to an excessive volume of blood received in culture bottles   Culture  Setup Time   Final    GRAM POSITIVE COCCI IN BOTH AEROBIC AND ANAEROBIC BOTTLES CRITICAL RESULT CALLED TO, READ BACK BY AND VERIFIED WITH: PHARMD C.JACKSON AT 1430 ON 10/30/2021 BY T.SAAD. Performed at [facility], [place] [address] [place], [place] [address]    Culture GRAM POSITIVE COCCI  Final   Report Status PENDING  Incomplete  Blood Culture ID Panel (Reflexed)     Status: Abnormal   Collection Time: 10/29/21  7:16 PM  Result Value Ref Range Status   Enterococcus faecalis NOT DETECTED NOT DETECTED Final   Enterococcus Faecium NOT DETECTED NOT DETECTED Final   Listeria monocytogenes NOT DETECTED NOT DETECTED Final   Staphylococcus species NOT DETECTED NOT DETECTED Final   Staphylococcus aureus (BCID) NOT DETECTED NOT DETECTED Final   Staphylococcus epidermidis NOT DETECTED NOT DETECTED Final   Staphylococcus lugdunensis NOT DETECTED NOT DETECTED Final   Streptococcus species DETECTED (A) NOT DETECTED Final    Comment: Not Enterococcus species, Streptococcus agalactiae, Streptococcus pyogenes, or Streptococcus pneumoniae. CRITICAL RESULT CALLED TO, READ BACK BY AND VERIFIED WITH: PHARMD C.JACKSON AT 1430 ON 10/30/2021 BY T.SAAD.    Streptococcus agalactiae NOT DETECTED NOT DETECTED Final   Streptococcus pneumoniae NOT DETECTED NOT DETECTED Final   Streptococcus pyogenes NOT DETECTED NOT DETECTED Final   A.calcoaceticus-baumannii NOT DETECTED NOT DETECTED Final   Bacteroides fragilis NOT DETECTED NOT DETECTED Final   Enterobacterales NOT DETECTED NOT DETECTED Final   Enterobacter cloacae complex NOT DETECTED NOT DETECTED Final   Escherichia coli NOT DETECTED NOT DETECTED Final   Klebsiella  aerogenes NOT DETECTED NOT DETECTED Final   Klebsiella oxytoca NOT DETECTED NOT DETECTED Final   Klebsiella pneumoniae NOT DETECTED NOT DETECTED Final   Proteus species NOT DETECTED NOT DETECTED Final   Salmonella species NOT DETECTED NOT DETECTED Final   Serratia marcescens NOT DETECTED NOT DETECTED Final   Haemophilus influenzae NOT DETECTED NOT DETECTED Final   Neisseria meningitidis NOT DETECTED NOT DETECTED Final   Pseudomonas aeruginosa NOT DETECTED NOT DETECTED Final   Stenotrophomonas maltophilia NOT DETECTED NOT DETECTED Final   Candida albicans NOT DETECTED NOT DETECTED Final   Candida auris NOT DETECTED NOT DETECTED Final   Candida glabrata NOT DETECTED NOT DETECTED Final   Candida krusei NOT DETECTED NOT DETECTED Final   Candida parapsilosis NOT DETECTED NOT DETECTED Final   Candida tropicalis NOT DETECTED NOT DETECTED Final   Cryptococcus neoformans/gattii NOT DETECTED NOT DETECTED Final    Comment: Performed at [facility], [place] [address] [place], [place] [address]     Imaging - I have independently reviewed the patients CT scan - she has left hydronephrosis and a peri-nephric fluid collection.  I discussed this with the patient  and drew her a picture explaining it.  Imp:  Likely urine leak from thermal injury during renal mass ablation. Plan: Will plan to take to OR  - tomorrow at 3:30, for evaluation of left upper urinary tract with placement of a left ureteral stent. Please make her NPO p MN.

## 2021-10-30 NOTE — Assessment & Plan Note (Signed)
No clinical evidence of cardiogenic volume overload Strict input and output monitoring Daily weights Low-sodium diet  

## 2021-10-30 NOTE — Assessment & Plan Note (Signed)
   Conservatively managed with IR embolization of the renal mass 08/22/2021  Appreciate urology's input on what next best steps would be, if any, in managing this patient's renal cell carcinoma

## 2021-10-30 NOTE — Assessment & Plan Note (Signed)
.   Patient been placed on Accu-Cheks before every meal and nightly with sliding scale insulin . Hemoglobin A1C ordered

## 2021-10-30 NOTE — Assessment & Plan Note (Addendum)
   Patient exhibiting leukocytosis and intermittent tachypnea in the emergency room   Patient also exhibiting a lactic acidosis  Patient also reporting several weeks of low-grade temperatures of approximately 100 F  Unclear etiology of patient's SIRS.  It is worth noting that patient had leukocytosis back in July as well.  While patient was empirically given antibiotics initially cultures remain negative and therefore the course of antibiotics was not completed.  This time, patient states that she is symptomatically worsening with intense generalized malaise and weakness and inability to perform ADLs  Furthermore, patient seems to have developed severe left-sided hydronephrosis but no obvious evidence of pyelonephritis and I am uncertain as to how this would play into the patient's symptoms.  Patient has no flank pain or tenderness on exam  Chest x-ray is clear, urinalysis is unremarkable  Procalcitonin is somewhat elevated however.  We will keep patient on empiric antibiotic therapy for now since patient's procalcitonin is elevated  If blood cultures remain negative for 48 hours then antibiotics can likely be discontinued once again  If no definitive evidence of infection can be identified perhaps the low-grade temperatures are from the tumor itself?

## 2021-10-31 ENCOUNTER — Telehealth: Payer: Self-pay

## 2021-10-31 ENCOUNTER — Other Ambulatory Visit: Payer: Self-pay

## 2021-10-31 ENCOUNTER — Encounter (HOSPITAL_COMMUNITY): Payer: Self-pay | Admitting: Internal Medicine

## 2021-10-31 ENCOUNTER — Inpatient Hospital Stay (HOSPITAL_COMMUNITY): Payer: Medicare PPO | Admitting: Anesthesiology

## 2021-10-31 ENCOUNTER — Encounter (HOSPITAL_COMMUNITY)
Admission: EM | Disposition: A | Discharge status: Home Health | Payer: Self-pay | Source: Ambulatory Visit | Attending: Student

## 2021-10-31 ENCOUNTER — Inpatient Hospital Stay (HOSPITAL_COMMUNITY): Payer: Medicare PPO

## 2021-10-31 DIAGNOSIS — I132 Hypertensive heart and chronic kidney disease with heart failure and with stage 5 chronic kidney disease, or end stage renal disease: Secondary | ICD-10-CM | POA: Diagnosis not present

## 2021-10-31 DIAGNOSIS — R6511 Systemic inflammatory response syndrome (SIRS) of non-infectious origin with acute organ dysfunction: Secondary | ICD-10-CM

## 2021-10-31 DIAGNOSIS — C642 Malignant neoplasm of left kidney, except renal pelvis: Secondary | ICD-10-CM

## 2021-10-31 DIAGNOSIS — N132 Hydronephrosis with renal and ureteral calculous obstruction: Secondary | ICD-10-CM

## 2021-10-31 DIAGNOSIS — I2510 Atherosclerotic heart disease of native coronary artery without angina pectoris: Secondary | ICD-10-CM

## 2021-10-31 DIAGNOSIS — E1122 Type 2 diabetes mellitus with diabetic chronic kidney disease: Secondary | ICD-10-CM

## 2021-10-31 DIAGNOSIS — Z85528 Personal history of other malignant neoplasm of kidney: Secondary | ICD-10-CM

## 2021-10-31 DIAGNOSIS — N186 End stage renal disease: Secondary | ICD-10-CM

## 2021-10-31 DIAGNOSIS — Z992 Dependence on renal dialysis: Secondary | ICD-10-CM

## 2021-10-31 DIAGNOSIS — N2889 Other specified disorders of kidney and ureter: Secondary | ICD-10-CM

## 2021-10-31 DIAGNOSIS — E139 Other specified diabetes mellitus without complications: Secondary | ICD-10-CM

## 2021-10-31 DIAGNOSIS — Z794 Long term (current) use of insulin: Secondary | ICD-10-CM

## 2021-10-31 DIAGNOSIS — I509 Heart failure, unspecified: Secondary | ICD-10-CM

## 2021-10-31 DIAGNOSIS — I480 Paroxysmal atrial fibrillation: Secondary | ICD-10-CM | POA: Diagnosis not present

## 2021-10-31 DIAGNOSIS — Z90410 Acquired total absence of pancreas: Secondary | ICD-10-CM

## 2021-10-31 DIAGNOSIS — I5032 Chronic diastolic (congestive) heart failure: Secondary | ICD-10-CM | POA: Diagnosis not present

## 2021-10-31 DIAGNOSIS — R6510 Systemic inflammatory response syndrome (SIRS) of non-infectious origin without acute organ dysfunction: Secondary | ICD-10-CM | POA: Diagnosis not present

## 2021-10-31 DIAGNOSIS — E872 Acidosis, unspecified: Secondary | ICD-10-CM

## 2021-10-31 DIAGNOSIS — E891 Postprocedural hypoinsulinemia: Secondary | ICD-10-CM

## 2021-10-31 DIAGNOSIS — N1330 Unspecified hydronephrosis: Secondary | ICD-10-CM

## 2021-10-31 HISTORY — PX: CYSTOSCOPY W/ URETERAL STENT PLACEMENT: SHX1429

## 2021-10-31 LAB — CBC
HCT: 25.5 % — ABNORMAL LOW (ref 36.0–46.0)
Hemoglobin: 7.7 g/dL — ABNORMAL LOW (ref 12.0–15.0)
MCH: 27.4 pg (ref 26.0–34.0)
MCHC: 30.2 g/dL (ref 30.0–36.0)
MCV: 90.7 fL (ref 80.0–100.0)
Platelets: 331 10*3/uL (ref 150–400)
RBC: 2.81 MIL/uL — ABNORMAL LOW (ref 3.87–5.11)
RDW: 17.1 % — ABNORMAL HIGH (ref 11.5–15.5)
WBC: 11.7 10*3/uL — ABNORMAL HIGH (ref 4.0–10.5)
nRBC: 0 % (ref 0.0–0.2)

## 2021-10-31 LAB — RENAL FUNCTION PANEL
Albumin: 2 g/dL — ABNORMAL LOW (ref 3.5–5.0)
Anion gap: 13 (ref 5–15)
BUN: 46 mg/dL — ABNORMAL HIGH (ref 8–23)
CO2: 24 mmol/L (ref 22–32)
Calcium: 8.9 mg/dL (ref 8.9–10.3)
Chloride: 100 mmol/L (ref 98–111)
Creatinine, Ser: 5.41 mg/dL — ABNORMAL HIGH (ref 0.44–1.00)
GFR, Estimated: 7 mL/min — ABNORMAL LOW (ref >60)
Glucose, Bld: 172 mg/dL — ABNORMAL HIGH (ref 70–99)
Phosphorus: 6 mg/dL — ABNORMAL HIGH (ref 2.5–4.6)
Potassium: 4.5 mmol/L (ref 3.5–5.1)
Sodium: 137 mmol/L (ref 135–145)

## 2021-10-31 LAB — HEPATITIS B SURFACE ANTIBODY, QUANTITATIVE: Hep B S AB Quant (Post): <3.1 m[IU]/mL — ABNORMAL LOW (ref >9.9)

## 2021-10-31 LAB — POCT I-STAT, CHEM 8
BUN: 18 mg/dL (ref 8–23)
Calcium, Ion: 1.08 mmol/L — ABNORMAL LOW (ref 1.15–1.40)
Chloride: 96 mmol/L — ABNORMAL LOW (ref 98–111)
Creatinine, Ser: 2.7 mg/dL — ABNORMAL HIGH (ref 0.44–1.00)
Glucose, Bld: 130 mg/dL — ABNORMAL HIGH (ref 70–99)
HCT: 29 % — ABNORMAL LOW (ref 36.0–46.0)
Hemoglobin: 9.9 g/dL — ABNORMAL LOW (ref 12.0–15.0)
Potassium: 4.5 mmol/L (ref 3.5–5.1)
Sodium: 136 mmol/L (ref 135–145)
TCO2: 31 mmol/L (ref 22–32)

## 2021-10-31 LAB — SURGICAL PCR SCREEN
MRSA, PCR: NEGATIVE
Staphylococcus aureus: POSITIVE — AB

## 2021-10-31 LAB — GLUCOSE, CAPILLARY
Glucose-Capillary: 126 mg/dL — ABNORMAL HIGH (ref 70–99)
Glucose-Capillary: 122 mg/dL — ABNORMAL HIGH (ref 70–99)
Glucose-Capillary: 188 mg/dL — ABNORMAL HIGH (ref 70–99)

## 2021-10-31 SURGERY — CYSTOSCOPY, WITH RETROGRADE PYELOGRAM AND URETERAL STENT INSERTION
Anesthesia: General | Laterality: Left

## 2021-10-31 MED ORDER — CEFAZOLIN SODIUM-DEXTROSE 2-3 GM-%(50ML) IV SOLR
INTRAVENOUS | Status: DC | PRN
  Administered 2021-10-31: 2 g via INTRAVENOUS

## 2021-10-31 MED ORDER — IOHEXOL 300 MG/ML  SOLN
INTRAMUSCULAR | Status: DC | PRN
  Administered 2021-10-31: 50 mL

## 2021-10-31 MED ORDER — CHLORHEXIDINE GLUCONATE 0.12 % MT SOLN: 15.0000 mL | Freq: Once | OROMUCOSAL | Status: DC

## 2021-10-31 MED ORDER — WATER FOR IRRIGATION, STERILE IR SOLN
Status: DC | PRN
  Administered 2021-10-31: 1000 mL

## 2021-10-31 MED ORDER — LIDOCAINE HCL (PF) 1 % IJ SOLN: 5.0000 mL | INTRAMUSCULAR | Status: DC | PRN

## 2021-10-31 MED ORDER — ORAL CARE MOUTH RINSE: 15.0000 mL | Freq: Once | OROMUCOSAL | Status: DC

## 2021-10-31 MED ORDER — ONDANSETRON HCL 4 MG/2ML IJ SOLN
INTRAMUSCULAR | Status: AC
  Filled 2021-10-31: qty 2

## 2021-10-31 MED ORDER — FENTANYL CITRATE (PF) 100 MCG/2ML IJ SOLN: 25.0000 ug | INTRAMUSCULAR | Status: DC | PRN

## 2021-10-31 MED ORDER — LIDOCAINE-PRILOCAINE 2.5-2.5 % EX CREA: 1.0000 | TOPICAL_CREAM | CUTANEOUS | Status: DC | PRN

## 2021-10-31 MED ORDER — PHENYLEPHRINE 80 MCG/ML (10ML) SYRINGE FOR IV PUSH (FOR BLOOD PRESSURE SUPPORT)
PREFILLED_SYRINGE | INTRAVENOUS | Status: AC
  Filled 2021-10-31: qty 20

## 2021-10-31 MED ORDER — PHENYLEPHRINE 80 MCG/ML (10ML) SYRINGE FOR IV PUSH (FOR BLOOD PRESSURE SUPPORT)
PREFILLED_SYRINGE | INTRAVENOUS | Status: DC | PRN
  Administered 2021-10-31: 80 ug via INTRAVENOUS

## 2021-10-31 MED ORDER — CHLORHEXIDINE GLUCONATE 0.12 % MT SOLN
OROMUCOSAL | Status: AC
  Administered 2021-10-31: 15 mL
  Filled 2021-10-31: qty 15

## 2021-10-31 MED ORDER — FENTANYL CITRATE (PF) 250 MCG/5ML IJ SOLN
INTRAMUSCULAR | Status: AC
  Filled 2021-10-31: qty 5

## 2021-10-31 MED ORDER — ONDANSETRON HCL 4 MG/2ML IJ SOLN
INTRAMUSCULAR | Status: DC | PRN
  Administered 2021-10-31: 4 mg via INTRAVENOUS

## 2021-10-31 MED ORDER — CEFAZOLIN SODIUM-DEXTROSE 2-4 GM/100ML-% IV SOLN
INTRAVENOUS | Status: AC
  Filled 2021-10-31: qty 100

## 2021-10-31 MED ORDER — MUPIROCIN 2 % EX OINT
1.0000 | TOPICAL_OINTMENT | Freq: Two times a day (BID) | CUTANEOUS | Status: DC
  Administered 2021-10-31 – 2021-11-02 (×4): 1 via NASAL
  Filled 2021-10-31: qty 22

## 2021-10-31 MED ORDER — DROPERIDOL 2.5 MG/ML IJ SOLN: 0.6250 mg | Freq: Once | INTRAMUSCULAR | Status: DC | PRN

## 2021-10-31 MED ORDER — ROCURONIUM BROMIDE 10 MG/ML (PF) SYRINGE
PREFILLED_SYRINGE | INTRAVENOUS | Status: AC
  Filled 2021-10-31: qty 20

## 2021-10-31 MED ORDER — ACETAMINOPHEN 10 MG/ML IV SOLN
INTRAVENOUS | Status: AC
  Filled 2021-10-31: qty 100

## 2021-10-31 MED ORDER — PROPOFOL 10 MG/ML IV BOLUS
INTRAVENOUS | Status: DC | PRN
  Administered 2021-10-31 (×2): 80 mg via INTRAVENOUS

## 2021-10-31 MED ORDER — SODIUM CHLORIDE 0.9 % IV SOLN: INTRAVENOUS | Status: DC

## 2021-10-31 MED ORDER — LIDOCAINE 2% (20 MG/ML) 5 ML SYRINGE
INTRAMUSCULAR | Status: DC | PRN
  Administered 2021-10-31: 50 mg via INTRAVENOUS

## 2021-10-31 MED ORDER — PENTAFLUOROPROP-TETRAFLUOROETH EX AERO: 1.0000 | INHALATION_SPRAY | CUTANEOUS | Status: DC | PRN

## 2021-10-31 MED ORDER — ONDANSETRON HCL 4 MG/2ML IJ SOLN: 4.0000 mg | Freq: Once | INTRAMUSCULAR | Status: DC | PRN

## 2021-10-31 MED ORDER — FENTANYL CITRATE (PF) 250 MCG/5ML IJ SOLN
INTRAMUSCULAR | Status: DC | PRN
  Administered 2021-10-31: 50 ug via INTRAVENOUS

## 2021-10-31 MED ORDER — SODIUM CHLORIDE 0.9 % IR SOLN
Status: DC | PRN
  Administered 2021-10-31: 3000 mL

## 2021-10-31 SURGICAL SUPPLY — 31 items
BAG DRN RND TRDRP ANRFLXCHMBR (UROLOGICAL SUPPLIES) ×1
BAG URINE DRAIN 2000ML AR STRL (UROLOGICAL SUPPLIES) ×1 IMPLANT
BAG URO CATCHER STRL LF (MISCELLANEOUS) ×1 IMPLANT
CATH FOLEY 2WAY SLVR  5CC 16FR (CATHETERS)
CATH FOLEY 2WAY SLVR 5CC 16FR (CATHETERS) IMPLANT
CATH INTERMIT  6FR 70CM (CATHETERS) ×1 IMPLANT
CATH URET 5FR 28IN OPEN ENDED (CATHETERS) IMPLANT
CATH URET DUAL LUMEN 6-10FR 50 (CATHETERS) IMPLANT
COVER SURGICAL LIGHT HANDLE (MISCELLANEOUS) IMPLANT
GAUZE 4X4 16PLY ~~LOC~~+RFID DBL (SPONGE) IMPLANT
GLOVE BIO SURGEON STRL SZ 6.5 (GLOVE) IMPLANT
GLOVE BIO SURGEON STRL SZ7 (GLOVE) IMPLANT
GLOVE BIO SURGEON STRL SZ8 (GLOVE) ×1 IMPLANT
GLOVE BIOGEL PI IND STRL 7.5 (GLOVE) IMPLANT
GLOVE SURG SS PI 7.0 STRL IVOR (GLOVE) IMPLANT
GOWN STRL REUS W/ TWL LRG LVL3 (GOWN DISPOSABLE) ×1 IMPLANT
GOWN STRL REUS W/ TWL XL LVL3 (GOWN DISPOSABLE) ×1 IMPLANT
GOWN STRL REUS W/TWL LRG LVL3 (GOWN DISPOSABLE) ×1
GOWN STRL REUS W/TWL XL LVL3 (GOWN DISPOSABLE) ×1
GUIDEWIRE ANG ZIPWIRE 038X150 (WIRE) IMPLANT
GUIDEWIRE STR DUAL SENSOR (WIRE) ×1 IMPLANT
KIT TURNOVER KIT B (KITS) ×1 IMPLANT
MANIFOLD NEPTUNE II (INSTRUMENTS) IMPLANT
NS IRRIG 1000ML POUR BTL (IV SOLUTION) IMPLANT
PACK CYSTO (CUSTOM PROCEDURE TRAY) ×1 IMPLANT
STENT URET 6FRX24 CONTOUR (STENTS) IMPLANT
STENT URET 6FRX26 CONTOUR (STENTS) IMPLANT
SYPHON OMNI JUG (MISCELLANEOUS) ×1 IMPLANT
TOWEL GREEN STERILE FF (TOWEL DISPOSABLE) ×1 IMPLANT
TUBE CONNECTING 12X1/4 (SUCTIONS) IMPLANT
WATER STERILE IRR 3000ML UROMA (IV SOLUTION) ×1 IMPLANT

## 2021-10-31 NOTE — Discharge Instructions (Signed)
DISCHARGE INSTRUCTIONS FOR KIDNEY STONE/URETERAL STENT   MEDICATIONS:  1.  Resume all your other meds from home.  ACTIVITY:  1. No strenuous activity x 1week  2. No driving while on narcotic pain medications  3. Drink plenty of water  4. Continue to walk at home - you can still get blood clots when you are at home, so keep active, but don't over do it.  5. May return to work/school tomorrow or when you feel ready   BATHING:  1. You can shower and we recommend daily showers    SIGNS/SYMPTOMS TO CALL:  Please call us if you have a fever greater than 101.5, uncontrolled nausea/vomiting, uncontrolled pain, dizziness, unable to urinate, bloody urine, chest pain, shortness of breath, leg swelling, leg pain, redness around wound, drainage from wound, or any other concerns or questions.   You can reach [place] at [phone].   FOLLOW-UP:  1. You have an appointment in 6 weeks

## 2021-10-31 NOTE — Procedures (Signed)
I was present at this dialysis session. I have reviewed the session itself and made appropriate changes.   3K Bath with K 4.5.  Hb 7.7 this AM, 8.0 yesterday, on ESA due today.  UF goal 1L.  Tolerating Tx well.   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  To OR today with urology for eval and L ureteral stent  Filed Weights   10/29/21 1632 10/31/21 0744  Weight: 57.2 kg 58.1 kg    Recent Labs  Lab 10/31/21 0748  NA 137  K 4.5  CL 100  CO2 24  GLUCOSE 172*  BUN 46*  CREATININE 5.41*  CALCIUM 8.9  PHOS 6.0*    Recent Labs  Lab 10/29/21 1709 10/30/21 0226 10/31/21 0748  WBC 13.2* 11.5* 11.7*  NEUTROABS 12.0* 9.4*  --   HGB 8.4* 8.0* 7.7*  HCT 28.3* 26.3* 25.5*  MCV 93.1 91.0 90.7  PLT 341 326 331    Scheduled Meds:  (feeding supplement) PROSource Plus  30 mL Oral BID BM   amiodarone  200 mg Oral Daily   calcitRIOL  1.25 mcg Oral Q M,W,F-1800   carvedilol  3.125 mg Oral BID   colestipol  1 g Oral Q M,W,F   darbepoetin (ARANESP) injection - DIALYSIS  150 mcg Intravenous Q Wed-HD   insulin aspart  0-6 Units Subcutaneous TID AC & HS   Lifitegrast  1 drop Both Eyes QHS   lipase/protease/amylase  36,000 Units Oral With snacks   lipase/protease/amylase  72,000 Units Oral TID WC   midodrine  10 mg Oral Q M,W,F-HD   sevelamer carbonate  1,600 mg Oral TID WC   sodium chloride flush  3 mL Intravenous Q12H   Continuous Infusions:  piperacillin-tazobactam (ZOSYN)  IV 2.25 g (10/31/21 0530)   PRN Meds:.acetaminophen **OR** acetaminophen, lidocaine (PF), lidocaine-prilocaine, ondansetron **OR** ondansetron (ZOFRAN) IV, pentafluoroprop-tetrafluoroeth, polyethylene glycol   Pearson Grippe  MD 10/31/2021, 9:43 AM

## 2021-10-31 NOTE — Consult Note (Signed)
The patient initially had a blind ending ureter, but I was able to poke through this and ultimately get a 50F double J ureteral stent into the upper pole of the left kidney.  Urine was aspirated from the catheter prior to placing the stent to confirm appropriate position.    At this point she has a 50F x 24cm double J stent in her left ureter.  This will stay for the at least 6 weeks. I will re-assess her in clinic at that point and discuss further.  Please page Urology for any additional questions/concerns.

## 2021-10-31 NOTE — Transfer of Care (Signed)
Immediate Anesthesia Transfer of Care Note  Patient: Kim Burns  Procedure(s) Performed: CYSTOSCOPY WITH RETROGRADE PYELOGRAM/URETERAL STENT PLACEMENT (Left)  Patient Location: PACU  Anesthesia Type:General  Level of Consciousness: awake, oriented and drowsy  Airway & Oxygen Therapy: Patient connected to nasal cannula oxygen  Post-op Assessment: Report given to RN and Post -op Vital signs reviewed and stable  Post vital signs: Reviewed and stable  Last Vitals:  Vitals Value Taken Time  BP 147/73 10/31/21 1645  Temp    Pulse 73 10/31/21 1647  Resp 18 10/31/21 1647  SpO2 100 % 10/31/21 1647  Vitals shown include unvalidated device data.  Last Pain:  Vitals:   10/31/21 1510  TempSrc: Oral  PainSc:          Complications: No notable events documented.

## 2021-10-31 NOTE — Procedures (Signed)
HD Note:  Some information was entered later than the data was gathered due to patient care needs. The entered time with the data is accurate.  Calcitrol and Midodrine were held as patient is npo.  BP is sufficient for treatment. Aranesp given  Received patient in bed to unit.  Alert and oriented.  Informed consent signed and in chart.   Patient tolerated well.  Transported back to the room  Alert, without acute distress.  Hand-off given to patient's nurse.   Access used: left arm fistula Access issues: no issues  Total UF removed: 1027m    Aeden Matranga [facility]

## 2021-10-31 NOTE — Anesthesia Procedure Notes (Signed)
Procedure Name: LMA Insertion Date/Time: 10/31/2021 3:49 PM  Performed by: Anastasio Auerbach, CRNAPre-anesthesia Checklist: Patient identified, Emergency Drugs available, Suction available and Patient being monitored Patient Re-evaluated:Patient Re-evaluated prior to induction Oxygen Delivery Method: Circle system utilized Preoxygenation: Pre-oxygenation with 100% oxygen Induction Type: IV induction Ventilation: Mask ventilation without difficulty LMA: LMA with gastric port inserted LMA Size: 3.0 Number of attempts: 1 Placement Confirmation: breath sounds checked- equal and bilateral and positive ETCO2 Tube secured with: Tape

## 2021-10-31 NOTE — Telephone Encounter (Addendum)
   Telephone encounter was:  Successful.  10/31/2021 Name: [name] MRN: [id number] DOB: 05/17/1935  Kim Burns is a 86 y.o. year old female who is a primary care patient of Ernie Hew Mechele Claude, MD . The community resource team was consulted for assistance with Transportation Needs   Care guide performed the following interventions:  Incoming call regarding transportation. Advised management.   Melissa Nettles will reach back out.  Follow Up Plan: N/A  [place] management  [place], [place]  Main Phone: [phone]  E-mail: [email or web address]  Website: [email or web address]

## 2021-10-31 NOTE — Interval H&P Note (Signed)
History and Physical Interval Note:  10/31/2021 3:27 PM  Kim Burns  has presented today for surgery, with the diagnosis of renal mass, ESRD.  The various methods of treatment have been discussed with the patient and family. After consideration of risks, benefits and other options for treatment, the patient has consented to  Procedure(s): CYSTOSCOPY WITH RETROGRADE PYELOGRAM/URETERAL STENT PLACEMENT (N/A) as a surgical intervention.  The patient's history has been reviewed, patient examined, no change in status, stable for surgery.  I have reviewed the patient's chart and labs.  Questions were answered to the patient's satisfaction.     Ardis Hughs

## 2021-10-31 NOTE — TOC Initial Note (Signed)
Transition of Care [facility]) - Initial/Assessment Note    Patient Details  Name: Kim Burns MRN: [id number] Date of Birth: 08-20-1935  [facility] CM/SW Contact:    Tom-Johnson, Renea Ee, RN Phone Number: 10/31/2021, 4:22 PM  Clinical Narrative:                  CM spoke with patient at bedside about needs for post hospital transition. Admitted for SIRS. Patient has Hx of Renal mass, s/p Left Renal mass ablation on 08/22/21.  CT scan showed Left Hydronephrosis and a peri-nephric fluid collection, has scheduled Cystoscopy with Retrograde Pyelogram/Ureteral shunt placement by Urology today. Urology and Nephrology following. Has inpatient dialysis today. Does outpatient dialysis on MWF schedule.  From home alone, has three children. States since her illness, her children schedule themselves to stay with her around the clock. They transports her to her appointments. Has a walker and shower seat at home.  PCP is [name], MD and uses [facility] on [address]. States she had Home health in August.  No PT/OT recommendations noted at this time. Resumption of care order will be placed when recommended.  CM will continue to follow as patient progresses with care towards discharge.    Barriers to Discharge: Continued Medical Work up   Patient Goals and CMS Choice Patient states their goals for this hospitalization and ongoing recovery are:: To return home CMS Medicare.gov Compare Post Acute Care list provided to:: Patient Choice offered to / list presented to : Patient  Expected Discharge Plan and Services     Discharge Planning Services: CM Consult   Living arrangements for the past 2 months: Single Family Home                                      Prior Living Arrangements/Services Living arrangements for the past 2 months: Single Family Home Lives with:: Self Patient language and need for interpreter reviewed:: Yes Do you feel safe  going back to the place where you live?: Yes      Need for Family Participation in Patient Care: Yes (Comment) Care giver support system in place?: Yes (comment) Current home services: Home PT, Home OT, DME (walker, shower seat) Criminal Activity/Legal Involvement Pertinent to Current Situation/Hospitalization: No - Comment as needed  Activities of Daily Living      Permission Sought/Granted Permission sought to share information with : Case Manager, Family Supports Permission granted to share information with : Yes, Verbal Permission Granted              Emotional Assessment Appearance:: Appears stated age Attitude/Demeanor/Rapport: Engaged, Gracious Affect (typically observed): Accepting, Appropriate, Calm, Hopeful, Pleasant Orientation: : Oriented to Self, Oriented to Place, Oriented to  Time, Oriented to Situation Alcohol / Substance Use: Not Applicable Psych Involvement: No (comment)  Admission diagnosis:  Renal mass [N28.89] ESRD on dialysis ([place]) [N18.6, Z99.2] Fever [R50.9] Patient Active Problem List   Diagnosis Date Noted   Lactic acidosis 10/30/2021   Fever 10/30/2021   SIRS (systemic inflammatory response syndrome) ([place]) 10/29/2021   Hydronephrosis of left kidney 10/29/2021   Chronic diastolic CHF (congestive heart failure) ([place]) 10/29/2021   Coronary artery disease involving native coronary artery of native heart without angina pectoris 10/29/2021   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) 10/03/2016   GERD (gastroesophageal reflux disease) 04/29/2016    Hyperglycemia 04/29/2016   IPMN (intraductal papillary mucinous neoplasm) 04/23/2016   History of stroke 04/16/2016   Sepsis ([place]) 06/18/2013   Localized swelling, mass and lump, neck 03/29/2013   Sinus bradycardia 09/18/2012   Fatigue 09/18/2012   PCP:  [name], MD Pharmacy:   [place], [place] - [address] [place] [address] Phone: [phone] Fax: [phone]  [facility] ([place], [place] [facility] [address] [place] [address] Phone: [phone] Fax: [phone]  [facility] [address] [place] [address] Phone: [phone] Fax: [phone]     Social Determinants of Health (SDOH) Interventions    Readmission Risk Interventions    08/27/2021   10:16 AM  Readmission Risk Prevention Plan  Transportation Screening Complete  PCP or Specialist Appt within 3-5 Days Complete  HRI or [place] Complete  Social Work Consult for [place] Planning/Counseling Complete  Palliative Care Screening Not Applicable  Medication Review Press photographer) Complete

## 2021-10-31 NOTE — Op Note (Signed)
Preoperative diagnosis:  Left Ureteral stricture/urine leak   Postoperative diagnosis:  same   Procedure:  Cystoscopy left ureteral stent placement left retrograde pyelography with interpretation   Surgeon: Ardis Hughs, MD  Anesthesia: General  Complications: None  Intraoperative findings:   Blind ending ureter at UPJ/proximal ureter Extensive contrast extravasation Ureteral continuity re-established. 24cm x 5F double J stent placed in the left ureter  EBL: Minimal  Specimens: None  Indication: Kim Burns is a 86 y.o. patient with renal mass s/p ablation which was complicated by thermal injury to the collecting system.  CT scan showed hydronephrosis and a peri-nephric urinoma.  After reviewing the management options for treatment, he elected to proceed with the above surgical procedure(s). We have discussed the potential benefits and risks of the procedure, side effects of the proposed treatment, the likelihood of the patient achieving the goals of the procedure, and any potential problems that might occur during the procedure or recuperation. Informed consent has been obtained.  Description of procedure:  The patient was taken to the operating room and general anesthesia was induced.  The patient was placed in the dorsal lithotomy position, prepped and draped in the usual sterile fashion, and preoperative antibiotics were administered. A preoperative time-out was performed.   Cystourethroscopy was performed.  The patient's urethra was examined and was normal. The bladder was then systematically examined in its entirety. There was no evidence for any bladder tumors, stones, or other mucosal pathology.    Attention then turned to the leftureteral orifice and a ureteral catheter was used to intubate the ureteral orifice.  Omnipaque contrast was injected through the ureteral catheter and a retrograde pyelogram was performed with findings as dictated above.  A 0.38  sensor guidewire was then advanced up the left ureter into the renal pelvis under fluoroscopic guidance.  Initially I was unable to advance the wire or open ended catheter up beyond the injured segment of the ureter.  I poked the wire through and then with the dual lumen catheter injected contrast, noted I was not likely in the collecting system with the wire. I then repassed the wire and after the third attempt was able to advance the open ended catheter beyond the UPJ.  I then aspirated the catheter and noted urine return.  I subsequently exchanged the wire for the open ended catheter and a ureteral stent was advanced over the wire using Seldinger technique.  The stent was positioned appropriately under fluoroscopic and cystoscopic guidance.  The wire was then removed with an adequate stent curl noted in the renal pelvis as well as in the bladder.  The bladder was then emptied and the procedure ended.  The patient appeared to tolerate the procedure well and without complications.  The patient was able to be awakened and transferred to the recovery unit in satisfactory condition.    Ardis Hughs, M.D.

## 2021-10-31 NOTE — Progress Notes (Signed)
PROGRESS NOTE  Kim Burns KGY:[id number] DOB: 1935/09/26   PCP: [name], MD  Patient is from: Home.  Children's stay with her.  Uses rolling walker at baseline.  DOA: 10/29/2021 LOS: 1  Chief complaints Chief Complaint  Patient presents with   Abnormal Lab     Brief Narrative / Interim history: 86 year old F with PMH of ESRD on HD MWF, paroxysmal A-fib on Eliquis, diastolic CHF, nonobstructive CAD, prior CVA, left RCC, prior Whipple procedure with resultant IDDM and HTN presenting with several weeks of low-grade "fever: "And generalized weakness and admitted with SIRS.  Patient had ablation of left renal mass in 08/2021.  CT abdomen and pelvis in ED showed interval development of post ablation changes to left kidney with additional interval development of severe hydronephrosis with obstruction of the proximal ureter.  She also had lactic acidosis and leukocytosis.  Cultures obtained.  She was started on vancomycin and Zosyn.  Nephrology and urology consulted before admission to the hospital.  Blood culture with Streptococcus in 1 out of 2 bottles.  Nephrology planning ureteral stent.  IR planning nephrostomy  Subjective: Seen and examined earlier this morning while on HD.  No major events overnight of this morning.  Has no complaints.  She denies pain, shortness of breath or GI symptoms.  Reports DOE at baseline.  Objective: Vitals:   10/31/21 1113 10/31/21 1121 10/31/21 1230 10/31/21 1510  BP: (!) 163/75  (!) 141/78 131/72  Pulse: 76  80   Resp: '20  19 18  '$ Temp: (!) 97.5 F (36.4 C)  98.5 F (36.9 C) 98 F (36.7 C)  TempSrc: Oral  Oral Oral  SpO2: 100%  97% 96%  Weight:  57.2 kg    Height:        Examination:  GENERAL: No apparent distress.  Nontoxic. HEENT: MMM.  Vision and hearing grossly intact.  NECK: Supple.  No apparent JVD.  RESP:  No IWOB.  Fair aeration bilaterally. CVS:  RRR. Heart sounds normal.  ABD/GI/GU: BS+. Abd soft, NTND.  MSK/EXT:   Moves extremities. No apparent deformity. No edema.  SKIN: no apparent skin lesion or wound NEURO: Awake, alert and oriented appropriately.  No apparent focal neuro deficit. PSYCH: Calm. Normal affect.   Procedures:  None  Microbiology summarized: Blood culture with Streptococcus species in 1 out of 2 bottles. COVID-19 PCR nonreactive.  Assessment and plan: Principal Problem:   SIRS (systemic inflammatory response syndrome) (HCC) Active Problems:   Hydronephrosis of left kidney   Lactic acidosis   Renal cell carcinoma of left kidney (HCC)   ESRD (end stage renal disease) on dialysis (HCC)   Paroxysmal atrial fibrillation (HCC)   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)   Chronic diastolic CHF (congestive heart failure) (HCC)   Fever  SIRS: Imaging raises concern about fluid collection near left kidney which could be post ablation or abscess.  Also new severe left hydronephrosis.  Blood culture with Streptococcus species in 1 out of 2 bottles.  Unclear if this is true bacteremia or contaminant.  She is hemodynamically stable.  Lactic acidosis improved.  She also has history of left RCC for which she had ablation in July 2023. -Continue broad-spectrum antibiotics -Nephrology planning stain for severe hydronephrosis -IR planning nephrostomy -Will run culture finding with ID after procedure  ESRD on HD MWF -HD per nephrology.   Severe left hydronephrosis -Plan for stent by urology and nephrostomy tube by IR   Controlled IDDM-1 after Whipple procedure: A1c 6.2%  on 09/06/2021 Recent Labs  Lab 10/30/21 0832 10/30/21 1147 10/30/21 1558 10/30/21 2039 10/31/21 1228  GLUCAP 160* 270* 185* 163* 126*  -Continue current insulin regimen -Recheck hemoglobin A1c  Pancreatic insufficiency after Whipple procedure -Continue Creon  Left kidney renal cell carcinoma status post embolization of the renal mass 08/22/2021.   Paroxysmal A-fib: Rate controlled. -Continue home dose  of amiodarone, Coreg -Hold Eliquis for procedure   Chronic diastolic CHF: TTE 04/1538 with LVEF of 60 to 65%, G3 DD, severe LAE and RAE and RVSP of 46.  Not on diuretics at home.  Could partly contribute to her generalized weakness.  -Fluid management by HD   Hypotension: Resolved.  -Continue midodrine on HD days   Body mass index is 23.06 kg/m.          DVT prophylaxis:    Code Status: Full code Family Communication: None at bedside Level of care: Telemetry Medical Status is: Inpatient Remains inpatient appropriate because: SIRS, obstructive uropathy requiring surgical intervention   Final disposition: TBD Consultants:  Urology Interventional radiology  Sch Meds:  Scheduled Meds:  [MAR Hold] (feeding supplement) PROSource Plus  30 mL Oral BID BM   [MAR Hold] amiodarone  200 mg Oral Daily   [MAR Hold] calcitRIOL  1.25 mcg Oral Q M,W,F-1800   [MAR Hold] carvedilol  3.125 mg Oral BID   chlorhexidine  15 mL Mouth/Throat Once   Or   mouth rinse  15 mL Mouth Rinse Once   [MAR Hold] colestipol  1 g Oral Q M,W,F   [MAR Hold] darbepoetin (ARANESP) injection - DIALYSIS  150 mcg Intravenous Q Wed-HD   [MAR Hold] insulin aspart  0-6 Units Subcutaneous TID AC & HS   [MAR Hold] Lifitegrast  1 drop Both Eyes QHS   [MAR Hold] lipase/protease/amylase  36,000 Units Oral With snacks   [MAR Hold] lipase/protease/amylase  72,000 Units Oral TID WC   [MAR Hold] midodrine  10 mg Oral Q M,W,F-HD   [MAR Hold] mupirocin ointment  1 Application Nasal BID   [MAR Hold] sevelamer carbonate  1,600 mg Oral TID WC   [MAR Hold] sodium chloride flush  3 mL Intravenous Q12H   Continuous Infusions:  sodium chloride 10 mL/hr at 10/31/21 1500   acetaminophen     ceFAZolin     [MAR Hold] piperacillin-tazobactam (ZOSYN)  IV 2.25 g (10/31/21 1410)   PRN Meds:.acetaminophen, [GQQ Hold] acetaminophen **OR** [MAR Hold] acetaminophen, ceFAZolin, [MAR Hold] ondansetron **OR** [MAR Hold] ondansetron  (ZOFRAN) IV, [MAR Hold] polyethylene glycol  Antimicrobials: Anti-infectives (From admission, onward)    Start     Dose/Rate Route Frequency Ordered Stop   10/31/21 1440  ceFAZolin (ANCEF) 2-4 GM/100ML-% IVPB       Note to Pharmacy: [place]: cabinet override      10/31/21 1440 11/01/21 0244   10/30/21 0800  [MAR Hold]  piperacillin-tazobactam (ZOSYN) IVPB 2.25 g        (MAR Hold since Wed 10/31/2021 at 1449.Hold Reason: Transfer to a Procedural area)   2.25 g 100 mL/hr over 30 Minutes Intravenous Every 8 hours 10/30/21 0712     10/30/21 0600  piperacillin-tazobactam (ZOSYN) IVPB 2.25 g  Status:  Discontinued        2.25 g 100 mL/hr over 30 Minutes Intravenous Every 8 hours 10/29/21 2012 10/29/21 2356   10/30/21 0200  piperacillin-tazobactam (ZOSYN) IVPB 3.375 g  Status:  Discontinued        3.375 g 12.5 mL/hr over 240 Minutes Intravenous Every 8 hours  10/29/21 1912 10/29/21 2012   10/29/21 1915  piperacillin-tazobactam (ZOSYN) IVPB 3.375 g        3.375 g 100 mL/hr over 30 Minutes Intravenous  Once 10/29/21 1912 10/29/21 2012   10/29/21 1900  vancomycin (VANCOCIN) IVPB 1000 mg/200 mL premix        1,000 mg 200 mL/hr over 60 Minutes Intravenous  Once 10/29/21 1859 10/29/21 2035        I have personally reviewed the following labs and images: CBC: Recent Labs  Lab 10/29/21 1709 10/30/21 0226 10/31/21 0748 10/31/21 1513  WBC 13.2* 11.5* 11.7*  --   NEUTROABS 12.0* 9.4*  --   --   HGB 8.4* 8.0* 7.7* 9.9*  HCT 28.3* 26.3* 25.5* 29.0*  MCV 93.1 91.0 90.7  --   PLT 341 326 331  --    BMP &GFR Recent Labs  Lab 10/29/21 1709 10/30/21 0226 10/31/21 0748 10/31/21 1513  NA 137 139 137 136  K 4.1 4.2 4.5 4.5  CL 97* 99 100 96*  CO2 '28 27 24  '$ --   GLUCOSE 343* 271* 172* 130*  BUN 20 29* 46* 18  CREATININE 3.38* 4.11* 5.41* 2.70*  CALCIUM 8.6* 8.7* 8.9  --   MG  --  2.2  --   --   PHOS  --   --  6.0*  --    Estimated Creatinine Clearance: 12.3 mL/min (A) (by C-G  formula based on SCr of 2.7 mg/dL (H)). Liver & Pancreas: Recent Labs  Lab 10/25/21 1056 10/29/21 1709 10/30/21 0226 10/31/21 0748  AST '15 26 15  '$ --   ALT '24 19 16  '$ --   ALKPHOS 98 67 62  --   BILITOT 0.3 0.6 0.7  --   PROT 6.7 6.6 6.3*  --   ALBUMIN 3.5* 2.3* 2.2* 2.0*   No results for input(s): "LIPASE", "AMYLASE" in the last 168 hours. No results for input(s): "AMMONIA" in the last 168 hours. Diabetic: No results for input(s): "HGBA1C" in the last 72 hours. Recent Labs  Lab 10/30/21 0832 10/30/21 1147 10/30/21 1558 10/30/21 2039 10/31/21 1228  GLUCAP 160* 270* 185* 163* 126*   Cardiac Enzymes: No results for input(s): "CKTOTAL", "CKMB", "CKMBINDEX", "TROPONINI" in the last 168 hours. No results for input(s): "PROBNP" in the last 8760 hours. Coagulation Profile: Recent Labs  Lab 10/29/21 1709  INR 1.4*   Thyroid Function Tests: No results for input(s): "TSH", "T4TOTAL", "FREET4", "T3FREE", "THYROIDAB" in the last 72 hours. Lipid Profile: No results for input(s): "CHOL", "HDL", "LDLCALC", "TRIG", "CHOLHDL", "LDLDIRECT" in the last 72 hours. Anemia Panel: No results for input(s): "VITAMINB12", "FOLATE", "FERRITIN", "TIBC", "IRON", "RETICCTPCT" in the last 72 hours. Urine analysis:    Component Value Date/Time   COLORURINE YELLOW 10/29/2021 1530   APPEARANCEUR HAZY (A) 10/29/2021 1530   LABSPEC 1.015 10/29/2021 1530   PHURINE 7.0 10/29/2021 1530   GLUCOSEU NEGATIVE 10/29/2021 1530   HGBUR SMALL (A) 10/29/2021 1530   BILIRUBINUR NEGATIVE 10/29/2021 1530   KETONESUR NEGATIVE 10/29/2021 1530   PROTEINUR >=300 (A) 10/29/2021 1530   UROBILINOGEN 1.0 06/18/2013 1633   NITRITE NEGATIVE 10/29/2021 1530   LEUKOCYTESUR NEGATIVE 10/29/2021 1530   Sepsis Labs: Invalid input(s): "PROCALCITONIN", "LACTICIDVEN"  Microbiology: Recent Results (from the past 240 hour(s))  Culture, blood (Routine x 2)     Status: None (Preliminary result)   Collection Time: 10/29/21  5:10  PM   Specimen: BLOOD  Result Value Ref Range Status   Specimen Description BLOOD SITE  NOT SPECIFIED  Final   Special Requests   Final    BOTTLES DRAWN AEROBIC AND ANAEROBIC Blood Culture adequate volume   Culture   Final    NO GROWTH 2 DAYS Performed at [facility], [address], [place], [place] [address]    Report Status PENDING  Incomplete  SARS Coronavirus 2 by RT PCR (hospital order, performed in [facility] hospital lab) *cepheid single result test* Anterior Nasal Swab     Status: None   Collection Time: 10/29/21  7:00 PM   Specimen: Anterior Nasal Swab  Result Value Ref Range Status   SARS Coronavirus 2 by RT PCR NEGATIVE NEGATIVE Final    Comment: (NOTE) SARS-CoV-2 target nucleic acids are NOT DETECTED.  The SARS-CoV-2 RNA is generally detectable in upper and lower respiratory specimens during the acute phase of infection. The lowest concentration of SARS-CoV-2 viral copies this assay can detect is 250 copies / mL. A negative result does not preclude SARS-CoV-2 infection and should not be used as the sole basis for treatment or other patient management decisions.  A negative result may occur with improper specimen collection / handling, submission of specimen other than nasopharyngeal swab, presence of viral mutation(s) within the areas targeted by this assay, and inadequate number of viral copies (<250 copies / mL). A negative result must be combined with clinical observations, patient history, and epidemiological information.  Fact Sheet for Patients:   https://www.patel.info/  Fact Sheet for Healthcare Providers: https://hall.com/  This test is not yet approved or  cleared by the [place] FDA and has been authorized for detection and/or diagnosis of SARS-CoV-2 by FDA under an Emergency Use Authorization (EUA).  This EUA will remain in effect (meaning this test can be used) for the duration of the COVID-19  declaration under Section 564(b)(1) of the Act, 21 U.S.C. section 360bbb-3(b)(1), unless the authorization is terminated or revoked sooner.  Performed at [facility], [place] [address], [place], [place] [address]   Culture, blood (Routine x 2)     Status: None (Preliminary result)   Collection Time: 10/29/21  7:16 PM   Specimen: BLOOD  Result Value Ref Range Status   Specimen Description BLOOD RIGHT ANTECUBITAL  Final   Special Requests   Final    BOTTLES DRAWN AEROBIC AND ANAEROBIC Blood Culture results may not be optimal due to an excessive volume of blood received in culture bottles   Culture  Setup Time   Final    GRAM POSITIVE COCCI IN BOTH AEROBIC AND ANAEROBIC BOTTLES CRITICAL RESULT CALLED TO, READ BACK BY AND VERIFIED WITH: PHARMD C.JACKSON AT 1430 ON 10/30/2021 BY T.SAAD.    Culture   Final    GRAM POSITIVE COCCI IDENTIFICATION TO FOLLOW Performed at [facility], [place] [address], [place], [place] [address]    Report Status PENDING  Incomplete  Blood Culture ID Panel (Reflexed)     Status: Abnormal   Collection Time: 10/29/21  7:16 PM  Result Value Ref Range Status   Enterococcus faecalis NOT DETECTED NOT DETECTED Final   Enterococcus Faecium NOT DETECTED NOT DETECTED Final   Listeria monocytogenes NOT DETECTED NOT DETECTED Final   Staphylococcus species NOT DETECTED NOT DETECTED Final   Staphylococcus aureus (BCID) NOT DETECTED NOT DETECTED Final   Staphylococcus epidermidis NOT DETECTED NOT DETECTED Final   Staphylococcus lugdunensis NOT DETECTED NOT DETECTED Final   Streptococcus species DETECTED (A) NOT DETECTED Final    Comment: Not Enterococcus species, Streptococcus  agalactiae, Streptococcus pyogenes, or Streptococcus pneumoniae. CRITICAL RESULT CALLED TO, READ BACK BY AND VERIFIED WITH: PHARMD C.JACKSON AT 1430 ON 10/30/2021 BY T.SAAD.    Streptococcus agalactiae NOT DETECTED NOT DETECTED Final   Streptococcus pneumoniae NOT DETECTED NOT DETECTED  Final   Streptococcus pyogenes NOT DETECTED NOT DETECTED Final   A.calcoaceticus-baumannii NOT DETECTED NOT DETECTED Final   Bacteroides fragilis NOT DETECTED NOT DETECTED Final   Enterobacterales NOT DETECTED NOT DETECTED Final   Enterobacter cloacae complex NOT DETECTED NOT DETECTED Final   Escherichia coli NOT DETECTED NOT DETECTED Final   Klebsiella aerogenes NOT DETECTED NOT DETECTED Final   Klebsiella oxytoca NOT DETECTED NOT DETECTED Final   Klebsiella pneumoniae NOT DETECTED NOT DETECTED Final   Proteus species NOT DETECTED NOT DETECTED Final   Salmonella species NOT DETECTED NOT DETECTED Final   Serratia marcescens NOT DETECTED NOT DETECTED Final   Haemophilus influenzae NOT DETECTED NOT DETECTED Final   Neisseria meningitidis NOT DETECTED NOT DETECTED Final   Pseudomonas aeruginosa NOT DETECTED NOT DETECTED Final   Stenotrophomonas maltophilia NOT DETECTED NOT DETECTED Final   Candida albicans NOT DETECTED NOT DETECTED Final   Candida auris NOT DETECTED NOT DETECTED Final   Candida glabrata NOT DETECTED NOT DETECTED Final   Candida krusei NOT DETECTED NOT DETECTED Final   Candida parapsilosis NOT DETECTED NOT DETECTED Final   Candida tropicalis NOT DETECTED NOT DETECTED Final   Cryptococcus neoformans/gattii NOT DETECTED NOT DETECTED Final    Comment: Performed at [facility], [address] [place], [place] [address]    Radiology Studies: No results found.    Hamid Brookens T. Harper  If 7PM-7AM, please contact night-coverage [email or web address] 10/31/2021, 3:19 PM

## 2021-10-31 NOTE — Progress Notes (Signed)
Pt receives out-pt HD at [facility] on MWF. Pt arrives at 6:20 for 6:40 chair time. Will assist as needed.   Melven Sartorius Renal Navigator [phone]

## 2021-11-01 ENCOUNTER — Ambulatory Visit: Payer: Medicare PPO | Admitting: Nurse Practitioner

## 2021-11-01 ENCOUNTER — Encounter (HOSPITAL_COMMUNITY): Payer: Self-pay | Admitting: Urology

## 2021-11-01 ENCOUNTER — Inpatient Hospital Stay (HOSPITAL_COMMUNITY): Payer: Medicare PPO

## 2021-11-01 DIAGNOSIS — R6520 Severe sepsis without septic shock: Secondary | ICD-10-CM

## 2021-11-01 DIAGNOSIS — I480 Paroxysmal atrial fibrillation: Secondary | ICD-10-CM | POA: Diagnosis not present

## 2021-11-01 DIAGNOSIS — I5032 Chronic diastolic (congestive) heart failure: Secondary | ICD-10-CM | POA: Diagnosis not present

## 2021-11-01 DIAGNOSIS — R7881 Bacteremia: Secondary | ICD-10-CM

## 2021-11-01 DIAGNOSIS — C189 Malignant neoplasm of colon, unspecified: Secondary | ICD-10-CM | POA: Diagnosis not present

## 2021-11-01 DIAGNOSIS — D649 Anemia, unspecified: Secondary | ICD-10-CM

## 2021-11-01 DIAGNOSIS — A419 Sepsis, unspecified organism: Secondary | ICD-10-CM

## 2021-11-01 DIAGNOSIS — B955 Unspecified streptococcus as the cause of diseases classified elsewhere: Secondary | ICD-10-CM

## 2021-11-01 DIAGNOSIS — E891 Postprocedural hypoinsulinemia: Secondary | ICD-10-CM | POA: Diagnosis not present

## 2021-11-01 DIAGNOSIS — A491 Streptococcal infection, unspecified site: Secondary | ICD-10-CM

## 2021-11-01 DIAGNOSIS — R6510 Systemic inflammatory response syndrome (SIRS) of non-infectious origin without acute organ dysfunction: Secondary | ICD-10-CM | POA: Diagnosis not present

## 2021-11-01 LAB — CBC
HCT: 25.2 % — ABNORMAL LOW (ref 36.0–46.0)
Hemoglobin: 7.8 g/dL — ABNORMAL LOW (ref 12.0–15.0)
MCH: 27.9 pg (ref 26.0–34.0)
MCHC: 31 g/dL (ref 30.0–36.0)
MCV: 90 fL (ref 80.0–100.0)
Platelets: 337 10*3/uL (ref 150–400)
RBC: 2.8 MIL/uL — ABNORMAL LOW (ref 3.87–5.11)
RDW: 17 % — ABNORMAL HIGH (ref 11.5–15.5)
WBC: 8.4 10*3/uL (ref 4.0–10.5)
nRBC: 0 % (ref 0.0–0.2)

## 2021-11-01 LAB — RENAL FUNCTION PANEL
Albumin: 2.1 g/dL — ABNORMAL LOW (ref 3.5–5.0)
Anion gap: 11 (ref 5–15)
BUN: 25 mg/dL — ABNORMAL HIGH (ref 8–23)
CO2: 27 mmol/L (ref 22–32)
Calcium: 8.6 mg/dL — ABNORMAL LOW (ref 8.9–10.3)
Chloride: 99 mmol/L (ref 98–111)
Creatinine, Ser: 4.29 mg/dL — ABNORMAL HIGH (ref 0.44–1.00)
GFR, Estimated: 10 mL/min — ABNORMAL LOW (ref >60)
Glucose, Bld: 136 mg/dL — ABNORMAL HIGH (ref 70–99)
Phosphorus: 5.9 mg/dL — ABNORMAL HIGH (ref 2.5–4.6)
Potassium: 4.4 mmol/L (ref 3.5–5.1)
Sodium: 137 mmol/L (ref 135–145)

## 2021-11-01 LAB — PROTIME-INR
INR: 1.2 (ref 0.8–1.2)
Prothrombin Time: 14.6 seconds (ref 11.4–15.2)

## 2021-11-01 LAB — CULTURE, BLOOD (ROUTINE X 2)

## 2021-11-01 LAB — GLUCOSE, CAPILLARY
Glucose-Capillary: 162 mg/dL — ABNORMAL HIGH (ref 70–99)
Glucose-Capillary: 138 mg/dL — ABNORMAL HIGH (ref 70–99)
Glucose-Capillary: 216 mg/dL — ABNORMAL HIGH (ref 70–99)
Glucose-Capillary: 137 mg/dL — ABNORMAL HIGH (ref 70–99)
Glucose-Capillary: 218 mg/dL — ABNORMAL HIGH (ref 70–99)

## 2021-11-01 LAB — LACTIC ACID, PLASMA
Lactic Acid, Venous: 1.5 mmol/L (ref 0.5–1.9)
Lactic Acid, Venous: 1.6 mmol/L (ref 0.5–1.9)

## 2021-11-01 LAB — MAGNESIUM: Magnesium: 2.1 mg/dL (ref 1.7–2.4)

## 2021-11-01 MED ORDER — LIDOCAINE HCL (PF) 1 % IJ SOLN
INTRAMUSCULAR | Status: AC
  Filled 2021-11-01: qty 30

## 2021-11-01 MED ORDER — MELATONIN 3 MG PO TABS
3.0000 mg | ORAL_TABLET | Freq: Every day | ORAL | Status: DC
  Administered 2021-11-01 – 2021-11-03 (×3): 3 mg via ORAL
  Filled 2021-11-01 (×3): qty 1

## 2021-11-01 MED ORDER — CEFAZOLIN SODIUM-DEXTROSE 1-4 GM/50ML-% IV SOLN
1.0000 g | INTRAVENOUS | Status: DC
  Administered 2021-11-01: 1 g via INTRAVENOUS
  Filled 2021-11-01 (×2): qty 50

## 2021-11-01 MED ORDER — CEFAZOLIN SODIUM-DEXTROSE 1-4 GM/50ML-% IV SOLN
1.0000 g | INTRAVENOUS | Status: DC
  Filled 2021-11-01: qty 50

## 2021-11-01 MED ORDER — CEFAZOLIN SODIUM-DEXTROSE 1-4 GM/50ML-% IV SOLN: 1.0000 g | INTRAVENOUS | Status: DC

## 2021-11-01 NOTE — Plan of Care (Signed)

## 2021-11-01 NOTE — Addendum Note (Signed)
Addendum  created 11/01/21 1927 by Effie Berkshire, MD   Clinical Note Signed

## 2021-11-01 NOTE — TOC Progression Note (Signed)
Transition of Care [facility]) - Progression Note    Patient Details  Name: Kim Burns MRN: [id number] Date of Birth: Feb 17, 1936  [facility] CM/SW Contact  Tom-Johnson, Renea Ee, RN Phone Number: 11/01/2021, 12:53 PM  Clinical Narrative:     CM spoke with patient about home health recommendations. Patient requested to resume care with [facility]. Kelly notified and acceptance voiced, info on AVS. CM will continue to follow as patient progresses with care towards discharge.   Expected Discharge Plan: [place] Barriers to Discharge: Continued Medical Work up  Expected Discharge Plan and Services Expected Discharge Plan: [place]   Discharge Planning Services: CM Consult Post Acute Care Choice: [place] arrangements for the past 2 months: Single Family Home                 DME Arranged: N/A DME Agency: NA       HH Arranged: PT HH Agency: [facility] Date [place]: 11/01/21 Time [place]: 1250 Representative spoke with at [place]: [place] ([place]) Interventions    Readmission Risk Interventions    08/27/2021   10:16 AM  Readmission Risk Prevention Plan  Transportation Screening Complete  PCP or Specialist Appt within 3-5 Days Complete  HRI or [place] Complete  Social Work Consult for [place] Planning/Counseling Complete  Palliative Care Screening Not Applicable  Medication Review Press photographer) Complete

## 2021-11-01 NOTE — Consult Note (Signed)
[facility] for Infectious Disease       Reason for Consult:Bacteremia    Referring Physician: Dr. 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)   Lactic acidosis   Fever    (feeding supplement) PROSource Plus  30 mL Oral BID BM   amiodarone  200 mg Oral Daily   calcitRIOL  1.25 mcg Oral Q M,W,F-1800   carvedilol  3.125 mg Oral BID   colestipol  1 g Oral Q M,W,F   darbepoetin (ARANESP) injection - DIALYSIS  150 mcg Intravenous Q Wed-HD   insulin aspart  0-6 Units Subcutaneous TID AC & HS   Lifitegrast  1 drop Both Eyes QHS   lipase/protease/amylase  36,000 Units Oral With snacks   lipase/protease/amylase  72,000 Units Oral TID WC   midodrine  10 mg Oral Q M,W,F-HD   mupirocin ointment  1 Application Nasal BID   sevelamer carbonate  1,600 mg Oral TID WC   sodium chloride flush  3 mL Intravenous Q12H    Recommendations: Cefazolin after dialysis x 2 more doses (tomorrow and 9/18) Outpatient follow up with Dr. 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  This can be seen with colorectal issues such as colon cancer.  She has never had a colonoscopy due to concern for perforation which happened to her sibling.  I have explained to her the signficance of the finding related to polyps/colon cancer and the indication for colonoscopy in these cases and that Cologuard is not an adequate substitute.  There however is no acute indication for intervention and this can be further discussed with Dr. Paulita Fujita as an outpatient and I have asked her to follow up with him.  I do not see any particular signs of infection  otherwise as I suspect the positive blood culture is a transient episode but I would recommend she complete 7 days of antibiotics to assure clearance with cefazolin.  No signs of urinary infection.  No fever, no chills.  No significant leukocytosis and normal procalcitonin.    HPI: Kim Burns is a 86 y.o. 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  Work up was notable for left hydronephrosis and peri-nephric fluid collection and underwent left ureteral stent placement by Dr. Louis Meckel on 9/13 with plan to continue with the stent for 6 weeks.   On her initial evaluation she had blood cultures drawn and now one set is positive with Strep gallolyticus.  She otherwise though has had no fever, minimal leukocytosis, negative procalcitonin, negative UA for infection and no imaging findings for infection.  Glucose was 343 on admission.     Review of Systems:  Gastrointestinal: negative for nausea and diarrhea All other systems reviewed and are negative    Past Medical History:  Diagnosis Date   Anemia of chronic disease    takes iron   Anxiety  Blood transfusion without reported diagnosis    Bradycardia    Diabetes mellitus without complication ([place])    became diabetic after Whipple procedure   Diarrhea    Dysrhythmia 04/16/2016   bradycardia due to medication    ESRD on hemodialysis [facility])    M-W-F   GERD (gastroesophageal reflux disease)    Gout    Headache    History of kidney stones 06/2013   Hyperparathyroidism ([place])    Hypertension    PONV (postoperative nausea and vomiting)    Sleep apnea    no cpap machine. could not tolerate   Stroke ([place]) 04/16/2016   TIA 1995   Vitamin D deficiency      Social History   Tobacco Use   Smoking status: Never   Smokeless tobacco: Never  Vaping Use   Vaping Use: Never used  Substance Use Topics   Alcohol use: No   Drug use: No    Family History  Problem Relation Age of Onset   Heart disease Mother    Stroke Mother    Cancer Father        colon   Alzheimer's disease Sister    Alzheimer's disease Sister    Cancer Brother        brain   Breast cancer Neg Hx     Allergies  Allergen Reactions   Lopid [Gemfibrozil] Other (See Comments)    Myalgia    Lotemax [Loteprednol Etabonate] Rash    Skin burning    Statins Other (See Comments)    Muscle weakness   Norco [Hydrocodone-Acetaminophen] Other (See Comments)    Headaches  Tolerates acetaminophen    Other Diarrhea    Real Butter - diarrhea   Tomato Other (See Comments)    Belching    Vibra-Tab [Doxycycline] Other (See Comments)    Unknown reaction   Codeine Other (See Comments)    headache   Hydrocodone-Acetaminophen Other (See Comments)    Headache. Tolerates acetaminophen.    Physical Exam: Constitutional: in no apparent distress  Vitals:   11/01/21 0349 11/01/21 0900  BP: 114/60 120/61  Pulse: 66 70  Resp: 18 16  Temp: 97.8 F (36.6 C) 97.6 F (36.4 C)  SpO2: 95% 99%   EYES: anicteric Respiratory: normal respiratory effort GI: soft Musculoskeletal: no edema  Lab Results  Component Value Date   WBC 8.4 11/01/2021   HGB 7.8 (L) 11/01/2021   HCT 25.2 (L) 11/01/2021   MCV 90.0 11/01/2021   PLT 337 11/01/2021    Lab Results  Component Value Date   CREATININE 4.29 (H) 11/01/2021   BUN 25 (H) 11/01/2021   NA 137 11/01/2021   K 4.4 11/01/2021   CL 99 11/01/2021   CO2 27 11/01/2021    Lab Results  Component Value Date   ALT 16 10/30/2021   AST 15 10/30/2021   ALKPHOS 62 10/30/2021     Microbiology: Recent Results (from the past 240 hour(s))  Culture, blood (Routine x 2)     Status: None (Preliminary result)   Collection Time: 10/29/21   5:10 PM   Specimen: BLOOD  Result Value Ref Range Status   Specimen Description BLOOD SITE NOT SPECIFIED  Final   Special Requests   Final    BOTTLES DRAWN AEROBIC AND ANAEROBIC Blood Culture adequate volume   Culture   Final    NO GROWTH 3 DAYS Performed at [facility], [address], [place], [place] [address]    Report Status PENDING  Incomplete  SARS Coronavirus 2 by RT PCR (hospital order, performed in [facility] hospital lab) *cepheid single result test* Anterior Nasal Swab     Status: None   Collection Time: 10/29/21  7:00 PM   Specimen: Anterior Nasal Swab  Result Value Ref Range Status   SARS Coronavirus 2 by RT PCR NEGATIVE NEGATIVE Final    Comment: (NOTE) SARS-CoV-2 target nucleic acids are NOT DETECTED.  The SARS-CoV-2 RNA is generally detectable in upper and lower respiratory specimens during the acute phase of infection. The lowest concentration of SARS-CoV-2 viral copies this assay can detect is 250 copies / mL. A negative result does not preclude SARS-CoV-2 infection and should not be used as the sole basis for treatment or other patient management decisions.  A negative result may occur with improper specimen collection / handling, submission of specimen other than nasopharyngeal swab, presence of viral mutation(s) within the areas targeted by this assay, and inadequate number of viral copies (<250 copies / mL). A negative result must be combined with clinical observations, patient history, and epidemiological information.  Fact Sheet for Patients:   https://www.patel.info/  Fact Sheet for Healthcare Providers: https://hall.com/  This test is not yet approved or  cleared by the [place] FDA and has been authorized for detection and/or diagnosis of SARS-CoV-2 by FDA under an Emergency Use Authorization (EUA).  This EUA will remain in effect (meaning this test can be used) for the duration of the COVID-19  declaration under Section 564(b)(1) of the Act, 21 U.S.C. section 360bbb-3(b)(1), unless the authorization is terminated or revoked sooner.  Performed at [facility], [place] [address] [place], [place] [address]   Culture, blood (Routine x 2)     Status: Abnormal   Collection Time: 10/29/21  7:16 PM   Specimen: BLOOD  Result Value Ref Range Status   Specimen Description BLOOD RIGHT ANTECUBITAL  Final   Special Requests   Final    BOTTLES DRAWN AEROBIC AND ANAEROBIC Blood Culture results may not be optimal due to an excessive volume of blood received in culture bottles   Culture  Setup Time   Final    GRAM POSITIVE COCCI IN BOTH AEROBIC AND ANAEROBIC BOTTLES CRITICAL RESULT CALLED TO, READ BACK BY AND VERIFIED WITH: PHARMD C.JACKSON AT 1430 ON 10/30/2021 BY T.SAAD.    Culture (A)  Final    STREPTOCOCCUS GALLOLYTICUS THE SIGNIFICANCE OF ISOLATING THIS ORGANISM FROM A SINGLE SET OF BLOOD CULTURES WHEN MULTIPLE SETS ARE DRAWN IS UNCERTAIN. PLEASE NOTIFY THE MICROBIOLOGY DEPARTMENT WITHIN ONE WEEK IF SPECIATION AND SENSITIVITIES ARE REQUIRED. Performed at [facility], [place] [address] [place], [place] [address]    Report Status 11/01/2021 FINAL  Final  Blood Culture ID Panel (Reflexed)     Status: Abnormal   Collection Time: 10/29/21  7:16 PM  Result Value Ref Range Status   Enterococcus faecalis NOT DETECTED NOT DETECTED Final   Enterococcus Faecium NOT DETECTED NOT DETECTED Final   Listeria monocytogenes NOT DETECTED NOT DETECTED Final   Staphylococcus species NOT DETECTED NOT DETECTED Final   Staphylococcus aureus (BCID) NOT DETECTED NOT DETECTED Final   Staphylococcus epidermidis NOT DETECTED NOT DETECTED Final   Staphylococcus lugdunensis NOT DETECTED NOT DETECTED Final   Streptococcus species DETECTED (A) NOT DETECTED Final    Comment: Not Enterococcus species, Streptococcus agalactiae, Streptococcus pyogenes, or Streptococcus pneumoniae. CRITICAL RESULT CALLED TO,  READ BACK BY AND VERIFIED WITH: PHARMD C.JACKSON AT 1430 ON 10/30/2021 BY T.SAAD.  Streptococcus agalactiae NOT DETECTED NOT DETECTED Final   Streptococcus pneumoniae NOT DETECTED NOT DETECTED Final   Streptococcus pyogenes NOT DETECTED NOT DETECTED Final   A.calcoaceticus-baumannii NOT DETECTED NOT DETECTED Final   Bacteroides fragilis NOT DETECTED NOT DETECTED Final   Enterobacterales NOT DETECTED NOT DETECTED Final   Enterobacter cloacae complex NOT DETECTED NOT DETECTED Final   Escherichia coli NOT DETECTED NOT DETECTED Final   Klebsiella aerogenes NOT DETECTED NOT DETECTED Final   Klebsiella oxytoca NOT DETECTED NOT DETECTED Final   Klebsiella pneumoniae NOT DETECTED NOT DETECTED Final   Proteus species NOT DETECTED NOT DETECTED Final   Salmonella species NOT DETECTED NOT DETECTED Final   Serratia marcescens NOT DETECTED NOT DETECTED Final   Haemophilus influenzae NOT DETECTED NOT DETECTED Final   Neisseria meningitidis NOT DETECTED NOT DETECTED Final   Pseudomonas aeruginosa NOT DETECTED NOT DETECTED Final   Stenotrophomonas maltophilia NOT DETECTED NOT DETECTED Final   Candida albicans NOT DETECTED NOT DETECTED Final   Candida auris NOT DETECTED NOT DETECTED Final   Candida glabrata NOT DETECTED NOT DETECTED Final   Candida krusei NOT DETECTED NOT DETECTED Final   Candida parapsilosis NOT DETECTED NOT DETECTED Final   Candida tropicalis NOT DETECTED NOT DETECTED Final   Cryptococcus neoformans/gattii NOT DETECTED NOT DETECTED Final    Comment: Performed at [facility], [address] [place], [place] [address]  Surgical PCR screen     Status: Abnormal   Collection Time: 10/31/21  2:42 PM   Specimen: Nasal Mucosa; Nasal Swab  Result Value Ref Range Status   MRSA, PCR NEGATIVE NEGATIVE Final   Staphylococcus aureus POSITIVE (A) NEGATIVE Final    Comment: (NOTE) The Xpert SA Assay (FDA approved for NASAL specimens in patients 50 years of age and older), is one  component of a comprehensive surveillance program. It is not intended to diagnose infection nor to guide or monitor treatment. Performed at [facility], [place] [address] [place], [place] [address]     Joshawn Crissman W Iyah Laguna, [place] for Infectious Disease [facility] [email or web address] 11/01/2021, 9:52 AM

## 2021-11-01 NOTE — Anesthesia Postprocedure Evaluation (Addendum)
Anesthesia Post Note  Patient: Kim Burns  Procedure(s) Performed: CYSTOSCOPY WITH RETROGRADE PYELOGRAM/URETERAL STENT PLACEMENT (Left)     Patient location during evaluation: PACU Anesthesia Type: General Level of consciousness: awake and alert Pain management: pain level controlled Vital Signs Assessment: post-procedure vital signs reviewed and stable Respiratory status: spontaneous breathing, nonlabored ventilation, respiratory function stable and patient connected to nasal cannula oxygen Cardiovascular status: blood pressure returned to baseline and stable Postop Assessment: no apparent nausea or vomiting Anesthetic complications: no   No notable events documented.              Effie Berkshire

## 2021-11-01 NOTE — Care Management Important Message (Signed)
Important Message  Patient Details  Name: Kim Burns MRN: [id number] Date of Birth: 1935/03/18   Medicare Important Message Given:  Yes     Milina Pagett [address] 11/01/2021, 2:53 PM

## 2021-11-01 NOTE — Evaluation (Signed)
Physical Therapy Evaluation Patient Details Name: Kim Burns MRN: [id number] DOB: 11-Jun-1935 Today's Date: 11/01/2021  History of Present Illness  86 year old female presents to ED 10/29/21 with complaints of several weeks of fever and weakness. Admitted with SIRS s/p 9/13 CYSTOSCOPY WITH RETROGRADE PYELOGRAM/URETERAL STENT PLACEMENT  PMH: end-stage renal disease (Started HD 02/2021 now MWF), paroxysmal atrial fibrillation (on Eliquis), diastolic congestive heart failure (Echo 08/2021 with EF 60-65%, G3DD) coronary artery disease (cath 08/2021 with nonobstructive disease), prior stroke, left renal cell carcinoma, prior Whipple procedure with secondary insulin-dependent diabetes mellitus, hypertension  Clinical Impression  PTA pt living with 24 supervision from children in multistory home with 12 steps to enter. Pt reports ambulation in home with use of furniture and sometimes RW. Pt family has assisted with transport to HD although pt reports she still drives. Pt is currently limited in safe mobility by decreased strength and balance in presence of decreased safety awareness. PT recommends resumption of HHPT at discharge. PT will continue to follow acutely.       Recommendations for follow up therapy are one component of a multi-disciplinary discharge planning process, led by the attending physician.  Recommendations may be updated based on patient status, additional functional criteria and insurance authorization.  Follow Up Recommendations Home health PT      Assistance Recommended at Discharge Frequent or constant Supervision/Assistance  Patient can return home with the following  Assist for transportation;Help with stairs or ramp for entrance;Assistance with cooking/housework    Equipment Recommendations None recommended by PT     Functional Status Assessment Patient has had a recent decline in their functional status and demonstrates the ability to make significant improvements in  function in a reasonable and predictable amount of time.     Precautions / Restrictions Precautions Precautions: Fall Restrictions Weight Bearing Restrictions: No      Mobility  Bed Mobility Overal bed mobility: Needs Assistance Bed Mobility: Sit to Supine       Sit to supine: Min assist   General bed mobility comments: min A for pt to pull agaisnst therapist to come to upright, pt reports she pulls on her stable bedside table to come to seated at home    Transfers Overall transfer level: Needs assistance Equipment used: Rolling walker (2 wheels) Transfers: Sit to/from Stand Sit to Stand: Supervision           General transfer comment: pt with good power up into standing in RW, pt pauses in standing to see if she is dizzy or not    Ambulation/Gait Ambulation/Gait assistance: Supervision Gait Distance (Feet): 200 Feet Assistive device: Rolling walker (2 wheels) Gait Pattern/deviations: Step-through pattern, Decreased step length - right, Decreased step length - left Gait velocity: WFL Gait velocity interpretation: 1.31 - 2.62 ft/sec, indicative of limited community ambulator   General Gait Details: supervision for safety  Stairs Stairs: Yes Stairs assistance: Min guard Stair Management: One rail Right, Step to pattern, Sideways, Forwards Number of Stairs: 12 General stair comments: min guard for safety with both hands on R side for forward ascent of steps, pt with increased sideways pattern for descent, slowed and steady requires extensive standing rest break at top of stairs before going back down         Balance Overall balance assessment: Needs assistance   Sitting balance-Leahy Scale: Good       Standing balance-Leahy Scale: Fair  Pertinent Vitals/Pain Pain Assessment Pain Assessment: No/denies pain    Home Living Family/patient expects to be discharged to:: Private residence Living Arrangements:  Alone Available Help at Discharge: Family;Available 24 hours/day Type of Home: House Home Access: Stairs to enter Entrance Stairs-Rails: Left Entrance Stairs-Number of Steps: 13 Alternate Level Stairs-Number of Steps: 12 Home Layout: Two level;Able to live on main level with bedroom/bathroom Home Equipment: Rolling Walker (2 wheels);Cane - single point;Shower seat Additional Comments: family providing 24 hr assist since surgery in July    Prior Function Prior Level of Function : Independent/Modified Independent             Mobility Comments: furniture walks in her home, uses RW outside of her home ADLs Comments: independent in self care, light meal prep and light houskeeping     Hand Dominance   Dominant Hand: Right    Extremity/Trunk Assessment   Upper Extremity Assessment Upper Extremity Assessment: Defer to OT evaluation    Lower Extremity Assessment Lower Extremity Assessment: Generalized weakness    Cervical / Trunk Assessment Cervical / Trunk Assessment: Kyphotic  Communication   Communication: No difficulties  Cognition Arousal/Alertness: Awake/alert Behavior During Therapy: WFL for tasks assessed/performed Overall Cognitive Status: Within Functional Limits for tasks assessed                                          General Comments General comments (skin integrity, edema, etc.): HR in low 80s with ascent of stairs,        Assessment/Plan    PT Assessment Patient needs continued PT services  PT Problem List Decreased strength;Decreased balance;Decreased safety awareness       PT Treatment Interventions DME instruction;Gait training;Stair training;Functional mobility training;Therapeutic activities;Therapeutic exercise;Balance training;Cognitive remediation;Patient/family education    PT Goals (Current goals can be found in the Care Plan section)  Acute Rehab PT Goals Patient Stated Goal: get strong/safe enough to be able to live  alone again PT Goal Formulation: With patient Time For Goal Achievement: 11/15/21 Potential to Achieve Goals: Fair    Frequency Min 3X/week        AM-PAC PT "6 Clicks" Mobility  Outcome Measure Help needed turning from your back to your side while in a flat bed without using bedrails?: None Help needed moving from lying on your back to sitting on the side of a flat bed without using bedrails?: None Help needed moving to and from a bed to a chair (including a wheelchair)?: None Help needed standing up from a chair using your arms (e.g., wheelchair or bedside chair)?: None Help needed to walk in hospital room?: None Help needed climbing 3-5 steps with a railing? : A Little 6 Click Score: 23    End of Session   Activity Tolerance: Patient tolerated treatment well Patient left: in chair;with call bell/phone within reach Nurse Communication: Mobility status PT Visit Diagnosis: Muscle weakness (generalized) (M62.81);History of falling (Z91.81)    Time: 5397-6734 PT Time Calculation (min) (ACUTE ONLY): 20 min   Charges:   PT Evaluation $PT Eval Moderate Complexity: 1 Mod          Jessicia Napolitano B. Migdalia Dk PT, DPT Acute Rehabilitation Services Please use secure chat or  Call Office [phone]   [place] 11/01/2021, 12:27 PM

## 2021-11-01 NOTE — Progress Notes (Signed)
[facility] Progress Note   Subjective:  Seen in room - no CP/dyspnea. Didn't sleep well overnight, asking for something to help her tonight (was problem prior to admit too) - will order melatonin. Did ok with urology procedure yesterday - stent placed to prox L ureter - having a lot of hematuria today - would think this is probably normal s/p procedure but will defer to the urology and primary teams for this. If Hgb drops further, we can plan to transfuse her with dialysis tomorrow.   Objective Vitals:   10/31/21 2115 11/01/21 0349 11/01/21 0451 11/01/21 0900  BP: (!) 143/71 114/60  120/61  Pulse: 72 66  70  Resp: '18 18  16  '$ Temp: 98 F (36.7 C) 97.8 F (36.6 C)  97.6 F (36.4 C)  TempSrc:    Oral  SpO2: 99% 95%  99%  Weight:   56.9 kg   Height:       Physical Exam General: Well appearing woman, NAD. Room air. Heart: RRR; 3/6 murmur Lungs: CTAB  Abdomen: soft, non-tender Extremities: No LE edema Dialysis Access:  LUE AVF + bruit  Additional Objective Labs: Basic Metabolic Panel: Recent Labs  Lab 10/30/21 0226 10/31/21 0748 10/31/21 1513 11/01/21 0630  NA 139 137 136 137  K 4.2 4.5 4.5 4.4  CL 99 100 96* 99  CO2 27 24  --  27  GLUCOSE 271* 172* 130* 136*  BUN 29* 46* 18 25*  CREATININE 4.11* 5.41* 2.70* 4.29*  CALCIUM 8.7* 8.9  --  8.6*  PHOS  --  6.0*  --  5.9*   Liver Function Tests: Recent Labs  Lab 10/25/21 1056 10/29/21 1709 10/29/21 1709 10/30/21 0226 10/31/21 0748 11/01/21 0630  AST 15 26  --  15  --   --   ALT 24 19  --  16  --   --   ALKPHOS 98 67  --  62  --   --   BILITOT 0.3 0.6  --  0.7  --   --   PROT 6.7 6.6  --  6.3*  --   --   ALBUMIN 3.5* 2.3*   < > 2.2* 2.0* 2.1*   < > = values in this interval not displayed.   CBC: Recent Labs  Lab 10/29/21 1709 10/30/21 0226 10/31/21 0748 10/31/21 1513 11/01/21 0630  WBC 13.2* 11.5* 11.7*  --  8.4  NEUTROABS 12.0* 9.4*  --   --   --   HGB 8.4* 8.0* 7.7* 9.9* 7.8*  HCT  28.3* 26.3* 25.5* 29.0* 25.2*  MCV 93.1 91.0 90.7  --  90.0  PLT 341 326 331  --  337   Blood Culture    Component Value Date/Time   SDES BLOOD RIGHT ANTECUBITAL 10/29/2021 1916   SPECREQUEST  10/29/2021 1916    BOTTLES DRAWN AEROBIC AND ANAEROBIC Blood Culture results may not be optimal due to an excessive volume of blood received in culture bottles   CULT (A) 10/29/2021 1916    STREPTOCOCCUS GALLOLYTICUS THE SIGNIFICANCE OF ISOLATING THIS ORGANISM FROM A SINGLE SET OF BLOOD CULTURES WHEN MULTIPLE SETS ARE DRAWN IS UNCERTAIN. PLEASE NOTIFY THE MICROBIOLOGY DEPARTMENT WITHIN ONE WEEK IF SPECIATION AND SENSITIVITIES ARE REQUIRED. Performed at [facility], [place] [address] [place], [place] [address]    REPTSTATUS 11/01/2021 FINAL 10/29/2021 1916   Studies/Results: DG C-Arm 1-60 Min  Result Date: 10/31/2021 CLINICAL DATA:  Cystoscopy with retrograde pyelogram/ureteral stent placement. EXAM: DG C-ARM 1-60  MIN CONTRAST:  Intraoperative study.  Information not given. FLUOROSCOPY: Fluoroscopy Time:  2 minutes 2nd Radiation Exposure Index (if provided by the fluoroscopic device): 13.48 mGy Number of Acquired Spot Images: 0 COMPARISON:  CT 10/29/2021 FINDINGS: Multiple intraoperative spot images demonstrate decompressed right ureter. Severe right hydronephrosis. Final images demonstrate placement of left ureteral stent. IMPRESSION: Intraoperative imaging as above. Electronically Signed   By: Rolm Baptise M.D.   On: 10/31/2021 17:32    Medications:  piperacillin-tazobactam (ZOSYN)  IV Stopped (11/01/21 0913)    (feeding supplement) PROSource Plus  30 mL Oral BID BM   amiodarone  200 mg Oral Daily   calcitRIOL  1.25 mcg Oral Q M,W,F-1800   carvedilol  3.125 mg Oral BID   colestipol  1 g Oral Q M,W,F   darbepoetin (ARANESP) injection - DIALYSIS  150 mcg Intravenous Q Wed-HD   insulin aspart  0-6 Units Subcutaneous TID AC & HS   Lifitegrast  1 drop Both Eyes QHS   lipase/protease/amylase   36,000 Units Oral With snacks   lipase/protease/amylase  72,000 Units Oral TID WC   midodrine  10 mg Oral Q M,W,F-HD   mupirocin ointment  1 Application Nasal BID   sevelamer carbonate  1,600 mg Oral TID WC   sodium chloride flush  3 mL Intravenous Q12H    Dialysis Orders: MWF at [facility] -> full HD 9/11, post-wt 57.3kg 3:45hr, 350/A1.5, EDW 57.5kg, 2K/2Ca bath, LUE AVF, 16g needles, no heparin - Mircera 29mg IV q 2 weeks (last given 9/1) - last Hgb 8.1 on 9/6 - Venofer '50mg'$  IV weekly - Calcitriol 1.274m PO q HD - HBsAg negative on 09/12/21   Assessment/Plan:  L hydronephrosis/urine leak from thermal injury: Recent coiling embolization of L renal tumor on 08/22/21. 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 Urology performed surgery 9/13 - ureter was blocked, stent placed. + hematuria today. Per urology and primary team.  ESRD:  Continue HD on usual MWF schedule - next 9/15.  BP/volume: Gets midodrine pre-HD, also on BB for A-fib. No edema. Follow.  Anemia of ESRD: Hgb low - 8, was same range last week as outpatient. Aranesp 15027mgiven 9/13. Transfuse if drops < 7. + hematuria today.  Metabolic bone disease: CorrCa ok, Phos a little high. Continue home binders + VDRA.  Nutrition:  Alb low, continue supplements while here.  A-fib: Dx in 08/2021, on amiodarone, BB, Eliquis BID. Seems to be in NSR this admit.  Insomnia: Chronic issue, asking for something to help tonight, will message hospitalist and order melatonin for her.  KatVeneta PentonA-C 11/01/2021, 10:13 AM  [employer]

## 2021-11-01 NOTE — Progress Notes (Signed)
PROGRESS NOTE  Kim Burns ESP:[id number] DOB: [birth year]   PCP: [name], MD  Patient is from: Home.  Children's stay with her.  Uses rolling walker at baseline.  DOA: 10/29/2021 LOS: 2  Chief complaints Chief Complaint  Patient presents with   Abnormal Lab     Brief Narrative / Interim history: 86 year old F with PMH of ESRD on HD MWF, paroxysmal A-fib on Eliquis, diastolic CHF, nonobstructive CAD, prior CVA, left RCC, prior Whipple procedure with resultant IDDM and HTN presenting with several weeks of low-grade "fever: "And generalized weakness and admitted with SIRS.  Patient had ablation of left renal mass in 08/2021.  CT abdomen and pelvis in ED showed interval development of post ablation changes to left kidney with additional interval development of severe hydronephrosis with obstruction of the proximal ureter.  She also had lactic acidosis and leukocytosis.  Cultures obtained.  She was started on vancomycin and Zosyn.  Nephrology and urology consulted before admission to the hospital.  Blood culture with Streptococcus gallolyticus in 1 out of 2 bottles.  Patient had left ureteral stent placement by urology on 9/13.  IR to place left perinephric drain on 9/15.  ID consulted and recommended outpatient follow-up with GI for colonoscopy given association between Streptococcus gallolyticus and colon cancer.  Patient is improving.  Subjective: Seen and examined earlier this morning.  No major events overnight of this morning.  No complaints.  She denies pain or dysuria.  Feels well.  She has been ambulating in the hallway without problem.  Objective: Vitals:   11/01/21 0451 11/01/21 0900 11/01/21 1149 11/01/21 1621  BP:  120/61 135/74 128/70  Pulse:  70 69 68  Resp:  '16 20 18  '$ Temp:  97.6 F (36.4 C) 98.1 F (36.7 C) 98.4 F (36.9 C)  TempSrc:  Oral    SpO2:  99% 100% 99%  Weight: 56.9 kg     Height:        Examination:  GENERAL: No apparent distress.   Nontoxic. HEENT: MMM.  Vision and hearing grossly intact.  NECK: Supple.  No apparent JVD.  RESP:  No IWOB.  Fair aeration bilaterally. CVS:  RRR. Heart sounds normal.  ABD/GI/GU: BS+. Abd soft, NTND.  No CVA tenderness. MSK/EXT:  Moves extremities. No apparent deformity. No edema.  SKIN: no apparent skin lesion or wound NEURO: Awake and alert. Oriented appropriately.  No apparent focal neuro deficit. PSYCH: Calm. Normal affect.   Procedures:  9/13-left ureteral stent placement  Microbiology summarized: Blood culture with Streptococcus species in 1 out of 2 bottles. COVID-19 PCR nonreactive.  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  Had leukocytosis, tachypnea, lactic acidosis and hypotension.  Imaging raises concern about fluid collection near left kidney which could be post ablation or abscess.  Also new severe left hydronephrosis.  Blood culture with Streptococcus gallolyticus in 1 out of 2 bottles.  She is hemodynamically stable.  Lactic acidosis and hypotension resolved.   -Appreciate ID recs -Vanco on 9/11.  Zosyn 9/11-9/14.  Ancef 9/13-9/19 per ID. -  Outpatient follow-up with GI for colonoscopy given association between colon cancer and S.  Gallolyticus -IR to place drain for left perinephric fluid collection/possible abscess  ESRD on HD MWF -HD per nephrology.   Severe left hydronephrosis -S/p left ureteral stent on 9/13   Controlled IDDM-1 after Whipple procedure: A1c 6.2% on 09/06/2021 Recent  Labs  Lab 10/31/21 2112 11/01/21 0352 11/01/21 0748 11/01/21 1146 11/01/21 1620  GLUCAP 188* 162* 138* 216* 137*  -Continue current insulin regimen -Recheck hemoglobin A1c  Pancreatic insufficiency after Whipple procedure -Continue Creon  Left kidney renal cell carcinoma:  s/p Embolization of the renal mass 08/22/2021. -Per urology   Paroxysmal A-fib: Rate controlled. -Continue home dose of amiodarone, Coreg -Hold Eliquis for procedure   Chronic diastolic CHF: TTE 02/5174 with LVEF of 60 to 65%, G3-DD, severe LAE and RAE and RVSP of 46.  Not on diuretics at home.  Could partly contribute to her generalized weakness.  -Fluid management by HD   Hypotension: Resolved.  -Continue midodrine on HD days  Normocytic anemia: Stable and at baseline. Recent Labs    08/30/21 0304 08/31/21 0518 09/05/21 1837 09/07/21 0521 09/19/21 1825 10/29/21 1709 10/30/21 0226 10/31/21 0748 10/31/21 1513 11/01/21 0630  HGB 8.0* 8.0* 8.8* 8.1* 7.8* 8.4* 8.0* 7.7* 9.9* 7.8*  -Monitor intermittently    Body mass index is 22.95 kg/m.          DVT prophylaxis:    Code Status: Full code Family Communication: None at bedside Level of care: Telemetry Medical Status is: Inpatient Remains inpatient appropriate because: SIRS, obstructive uropathy requiring surgical intervention   Final disposition: TBD Consultants:  Urology Interventional radiology Infectious disease  Sch Meds:  Scheduled Meds:  (feeding supplement) PROSource Plus  30 mL Oral BID BM   amiodarone  200 mg Oral Daily   calcitRIOL  1.25 mcg Oral Q M,W,F-1800   carvedilol  3.125 mg Oral BID   colestipol  1 g Oral Q M,W,F   darbepoetin (ARANESP) injection - DIALYSIS  150 mcg Intravenous Q Wed-HD   insulin aspart  0-6 Units Subcutaneous TID AC & HS   Lifitegrast  1 drop Both Eyes QHS   lipase/protease/amylase  36,000 Units Oral With snacks   lipase/protease/amylase  72,000 Units Oral TID WC   melatonin  3 mg Oral QHS    midodrine  10 mg Oral Q M,W,F-HD   mupirocin ointment  1 Application Nasal BID   sevelamer carbonate  1,600 mg Oral TID WC   sodium chloride flush  3 mL Intravenous Q12H   Continuous Infusions:   ceFAZolin (ANCEF) IV Stopped (11/01/21 1415)   PRN Meds:.acetaminophen **OR** acetaminophen, ondansetron **OR** ondansetron (ZOFRAN) IV, polyethylene glycol  Antimicrobials: Anti-infectives (From admission, onward)    Start     Dose/Rate Route Frequency Ordered Stop   11/02/21 1400  ceFAZolin (ANCEF) IVPB 1 g/50 mL premix  Status:  Discontinued        1 g 100 mL/hr over 30 Minutes Intravenous Every 24 hours 11/01/21 1220 11/01/21 1220   11/01/21 1400  ceFAZolin (ANCEF) IVPB 1 g/50 mL premix        1 g 100 mL/hr over 30 Minutes Intravenous Every 24 hours 11/01/21 1220 11/06/21 1359   11/01/21 1300  ceFAZolin (ANCEF) IVPB 1 g/50 mL premix  Status:  Discontinued        1 g 100 mL/hr over 30 Minutes Intravenous Every 24 hours 11/01/21 1213 11/01/21 1220   10/31/21 1440  ceFAZolin (ANCEF) 2-4 GM/100ML-% IVPB  Note to Pharmacy: [facility], [place]: cabinet override      10/31/21 1440 11/01/21 0244   10/30/21 0800  piperacillin-tazobactam (ZOSYN) IVPB 2.25 g  Status:  Discontinued        2.25 g 100 mL/hr over 30 Minutes Intravenous Every 8 hours 10/30/21 0712 11/01/21 1112   10/30/21 0600  piperacillin-tazobactam (ZOSYN) IVPB 2.25 g  Status:  Discontinued        2.25 g 100 mL/hr over 30 Minutes Intravenous Every 8 hours 10/29/21 2012 10/29/21 2356   10/30/21 0200  piperacillin-tazobactam (ZOSYN) IVPB 3.375 g  Status:  Discontinued        3.375 g 12.5 mL/hr over 240 Minutes Intravenous Every 8 hours 10/29/21 1912 10/29/21 2012   10/29/21 1915  piperacillin-tazobactam (ZOSYN) IVPB 3.375 g        3.375 g 100 mL/hr over 30 Minutes Intravenous  Once 10/29/21 1912 10/29/21 2012   10/29/21 1900  vancomycin (VANCOCIN) IVPB 1000 mg/200 mL premix        1,000 mg 200 mL/hr over 60 Minutes Intravenous   Once 10/29/21 1859 10/29/21 2035        I have personally reviewed the following labs and images: CBC: Recent Labs  Lab 10/29/21 1709 10/30/21 0226 10/31/21 0748 10/31/21 1513 11/01/21 0630  WBC 13.2* 11.5* 11.7*  --  8.4  NEUTROABS 12.0* 9.4*  --   --   --   HGB 8.4* 8.0* 7.7* 9.9* 7.8*  HCT 28.3* 26.3* 25.5* 29.0* 25.2*  MCV 93.1 91.0 90.7  --  90.0  PLT 341 326 331  --  337   BMP &GFR Recent Labs  Lab 10/29/21 1709 10/30/21 0226 10/31/21 0748 10/31/21 1513 11/01/21 0630  NA 137 139 137 136 137  K 4.1 4.2 4.5 4.5 4.4  CL 97* 99 100 96* 99  CO2 '28 27 24  '$ --  27  GLUCOSE 343* 271* 172* 130* 136*  BUN 20 29* 46* 18 25*  CREATININE 3.38* 4.11* 5.41* 2.70* 4.29*  CALCIUM 8.6* 8.7* 8.9  --  8.6*  MG  --  2.2  --   --  2.1  PHOS  --   --  6.0*  --  5.9*   Estimated Creatinine Clearance: 7.7 mL/min (A) (by C-G formula based on SCr of 4.29 mg/dL (H)). Liver & Pancreas: Recent Labs  Lab 10/29/21 1709 10/30/21 0226 10/31/21 0748 11/01/21 0630  AST 26 15  --   --   ALT 19 16  --   --   ALKPHOS 67 62  --   --   BILITOT 0.6 0.7  --   --   PROT 6.6 6.3*  --   --   ALBUMIN 2.3* 2.2* 2.0* 2.1*   No results for input(s): "LIPASE", "AMYLASE" in the last 168 hours. No results for input(s): "AMMONIA" in the last 168 hours. Diabetic: No results for input(s): "HGBA1C" in the last 72 hours. Recent Labs  Lab 10/31/21 2112 11/01/21 0352 11/01/21 0748 11/01/21 1146 11/01/21 1620  GLUCAP 188* 162* 138* 216* 137*   Cardiac Enzymes: No results for input(s): "CKTOTAL", "CKMB", "CKMBINDEX", "TROPONINI" in the last 168 hours. No results for input(s): "PROBNP" in the last 8760 hours. Coagulation Profile: Recent Labs  Lab 10/29/21 1709 11/01/21 0630  INR 1.4* 1.2   Thyroid Function Tests: No results for input(s): "TSH", "T4TOTAL", "FREET4", "T3FREE", "THYROIDAB" in the last 72 hours. Lipid Profile: No results for input(s): "CHOL", "HDL", "LDLCALC", "TRIG", "CHOLHDL",  "LDLDIRECT" in the last 72 hours.  Anemia Panel: No results for input(s): "VITAMINB12", "FOLATE", "FERRITIN", "TIBC", "IRON", "RETICCTPCT" in the last 72 hours. Urine analysis:    Component Value Date/Time   COLORURINE YELLOW 10/29/2021 1530   APPEARANCEUR HAZY (A) 10/29/2021 1530   LABSPEC 1.015 10/29/2021 1530   PHURINE 7.0 10/29/2021 1530   GLUCOSEU NEGATIVE 10/29/2021 1530   HGBUR SMALL (A) 10/29/2021 1530   BILIRUBINUR NEGATIVE 10/29/2021 1530   KETONESUR NEGATIVE 10/29/2021 1530   PROTEINUR >=300 (A) 10/29/2021 1530   UROBILINOGEN 1.0 06/18/2013 1633   NITRITE NEGATIVE 10/29/2021 1530   LEUKOCYTESUR NEGATIVE 10/29/2021 1530   Sepsis Labs: Invalid input(s): "PROCALCITONIN", "LACTICIDVEN"  Microbiology: Recent Results (from the past 240 hour(s))  Culture, blood (Routine x 2)     Status: None (Preliminary result)   Collection Time: 10/29/21  5:10 PM   Specimen: BLOOD  Result Value Ref Range Status   Specimen Description BLOOD SITE NOT SPECIFIED  Final   Special Requests   Final    BOTTLES DRAWN AEROBIC AND ANAEROBIC Blood Culture adequate volume   Culture   Final    NO GROWTH 3 DAYS Performed at [facility], [address] [place], [place] [address]    Report Status PENDING  Incomplete  SARS Coronavirus 2 by RT PCR (hospital order, performed in [facility] hospital lab) *cepheid single result test* Anterior Nasal Swab     Status: None   Collection Time: 10/29/21  7:00 PM   Specimen: Anterior Nasal Swab  Result Value Ref Range Status   SARS Coronavirus 2 by RT PCR NEGATIVE NEGATIVE Final    Comment: (NOTE) SARS-CoV-2 target nucleic acids are NOT DETECTED.  The SARS-CoV-2 RNA is generally detectable in upper and lower respiratory specimens during the acute phase of infection. The lowest concentration of SARS-CoV-2 viral copies this assay can detect is 250 copies / mL. A negative result does not preclude SARS-CoV-2 infection and should not be used as the sole  basis for treatment or other patient management decisions.  A negative result may occur with improper specimen collection / handling, submission of specimen other than nasopharyngeal swab, presence of viral mutation(s) within the areas targeted by this assay, and inadequate number of viral copies (<250 copies / mL). A negative result must be combined with clinical observations, patient history, and epidemiological information.  Fact Sheet for Patients:   https://www.patel.info/  Fact Sheet for Healthcare Providers: https://hall.com/  This test is not yet approved or  cleared by the [place] FDA and has been authorized for detection and/or diagnosis of SARS-CoV-2 by FDA under an Emergency Use Authorization (EUA).  This EUA will remain in effect (meaning this test can be used) for the duration of the COVID-19 declaration under Section 564(b)(1) of the Act, 21 U.S.C. section 360bbb-3(b)(1), unless the authorization is terminated or revoked sooner.  Performed at [facility], [place] [address] [place], [place] [address]   Culture, blood (Routine x 2)     Status: Abnormal   Collection Time: 10/29/21  7:16 PM   Specimen: BLOOD  Result Value Ref Range Status   Specimen Description BLOOD RIGHT ANTECUBITAL  Final   Special Requests   Final    BOTTLES DRAWN AEROBIC AND ANAEROBIC Blood Culture results may not be optimal due to an excessive volume of blood received in culture bottles   Culture  Setup Time   Final    GRAM POSITIVE COCCI IN BOTH AEROBIC AND ANAEROBIC BOTTLES CRITICAL RESULT CALLED TO, READ BACK BY AND VERIFIED  WITH: PHARMD C.JACKSON AT 1430 ON 10/30/2021 BY T.SAAD.    Culture (A)  Final    STREPTOCOCCUS GALLOLYTICUS THE SIGNIFICANCE OF ISOLATING THIS ORGANISM FROM A SINGLE SET OF BLOOD CULTURES WHEN MULTIPLE SETS ARE DRAWN IS UNCERTAIN. PLEASE NOTIFY THE MICROBIOLOGY DEPARTMENT WITHIN ONE WEEK IF SPECIATION AND SENSITIVITIES  ARE REQUIRED. Performed at [facility], [place] [address], [place], [place] [address]    Report Status 11/01/2021 FINAL  Final  Blood Culture ID Panel (Reflexed)     Status: Abnormal   Collection Time: 10/29/21  7:16 PM  Result Value Ref Range Status   Enterococcus faecalis NOT DETECTED NOT DETECTED Final   Enterococcus Faecium NOT DETECTED NOT DETECTED Final   Listeria monocytogenes NOT DETECTED NOT DETECTED Final   Staphylococcus species NOT DETECTED NOT DETECTED Final   Staphylococcus aureus (BCID) NOT DETECTED NOT DETECTED Final   Staphylococcus epidermidis NOT DETECTED NOT DETECTED Final   Staphylococcus lugdunensis NOT DETECTED NOT DETECTED Final   Streptococcus species DETECTED (A) NOT DETECTED Final    Comment: Not Enterococcus species, Streptococcus agalactiae, Streptococcus pyogenes, or Streptococcus pneumoniae. CRITICAL RESULT CALLED TO, READ BACK BY AND VERIFIED WITH: PHARMD C.JACKSON AT 1430 ON 10/30/2021 BY T.SAAD.    Streptococcus agalactiae NOT DETECTED NOT DETECTED Final   Streptococcus pneumoniae NOT DETECTED NOT DETECTED Final   Streptococcus pyogenes NOT DETECTED NOT DETECTED Final   A.calcoaceticus-baumannii NOT DETECTED NOT DETECTED Final   Bacteroides fragilis NOT DETECTED NOT DETECTED Final   Enterobacterales NOT DETECTED NOT DETECTED Final   Enterobacter cloacae complex NOT DETECTED NOT DETECTED Final   Escherichia coli NOT DETECTED NOT DETECTED Final   Klebsiella aerogenes NOT DETECTED NOT DETECTED Final   Klebsiella oxytoca NOT DETECTED NOT DETECTED Final   Klebsiella pneumoniae NOT DETECTED NOT DETECTED Final   Proteus species NOT DETECTED NOT DETECTED Final   Salmonella species NOT DETECTED NOT DETECTED Final   Serratia marcescens NOT DETECTED NOT DETECTED Final   Haemophilus influenzae NOT DETECTED NOT DETECTED Final   Neisseria meningitidis NOT DETECTED NOT DETECTED Final   Pseudomonas aeruginosa NOT DETECTED NOT DETECTED Final   Stenotrophomonas  maltophilia NOT DETECTED NOT DETECTED Final   Candida albicans NOT DETECTED NOT DETECTED Final   Candida auris NOT DETECTED NOT DETECTED Final   Candida glabrata NOT DETECTED NOT DETECTED Final   Candida krusei NOT DETECTED NOT DETECTED Final   Candida parapsilosis NOT DETECTED NOT DETECTED Final   Candida tropicalis NOT DETECTED NOT DETECTED Final   Cryptococcus neoformans/gattii NOT DETECTED NOT DETECTED Final    Comment: Performed at [facility], [address] [place], [place] [address]  Surgical PCR screen     Status: Abnormal   Collection Time: 10/31/21  2:42 PM   Specimen: Nasal Mucosa; Nasal Swab  Result Value Ref Range Status   MRSA, PCR NEGATIVE NEGATIVE Final   Staphylococcus aureus POSITIVE (A) NEGATIVE Final    Comment: (NOTE) The Xpert SA Assay (FDA approved for NASAL specimens in patients 39 years of age and older), is one component of a comprehensive surveillance program. It is not intended to diagnose infection nor to guide or monitor treatment. Performed at [facility], [place] [address] [place], [place] [address]     Radiology Studies: No results found.    Maurica Omura T. [place]  If 7PM-7AM, please contact night-coverage [email or web address] 11/01/2021, 8:08 PM

## 2021-11-01 NOTE — Progress Notes (Signed)
Patient planned for left perinephric drain placement today in IR -- orders for NPO at midnight on 11/01/21 placed by Jannifer Franklin, PA-C on 9/13 at 12:19 pm, this order was subsequently discontinued at 18:30 pm. Patient has been consuming PO intake all morning and we are unable to proceed with drain placement given need to use moderate sedation.  Will try for drain placement tomorrow -- new NPO order as been placed, RN aware patient needs to be NPO at midnight on 9/15. Eliquis will need to continue to be held until post procedure.  Candiss Norse, PA-C

## 2021-11-01 NOTE — Progress Notes (Signed)
Pt up to BR with help, to void---bright red urine noted. Denies pain or spasm. Will cont to monitor. SRP,RN

## 2021-11-01 NOTE — Evaluation (Signed)
Occupational Therapy Evaluation Patient Details Name: Kim Burns MRN: [id number] DOB: [birth year] Today's Date: 11/01/2021   History of Present Illness 86 year old female presents to ED 10/29/21 with complaints of several weeks of fever and weakness. Admitted with SIRS s/p 9/13 CYSTOSCOPY WITH RETROGRADE PYELOGRAM/URETERAL STENT PLACEMENT  PMH: end-stage renal disease (Started HD 02/2021 now MWF), paroxysmal atrial fibrillation (on Eliquis), diastolic congestive heart failure (Echo 08/2021 with EF 60-65%, G3DD) coronary artery disease (cath 08/2021 with nonobstructive disease), prior stroke, left renal cell carcinoma, prior Whipple procedure with secondary insulin-dependent diabetes mellitus, hypertension   Clinical Impression   Pt reports furniture walking in her home and using a RW outside of her home. She functions independently in self care, light IADLs and meal prep. Pt presents with generalized weakness and impaired standing balance. She has been walking to the bathroom on her own. Pt is safer with use of RW, encouraged use. Will follow acutely. Do not anticipate pt will require post acute OT.     Recommendations for follow up therapy are one component of a multi-disciplinary discharge planning process, led by the attending physician.  Recommendations may be updated based on patient status, additional functional criteria and insurance authorization.   Follow Up Recommendations  No OT follow up   Assistance Recommended at Discharge Intermittent Supervision/Assistance  Patient can return home with the following A little help with walking and/or transfers;Assistance with cooking/housework;Assist for transportation;Help with stairs or ramp for entrance    Functional Status Assessment  Patient has had a recent decline in their functional status and demonstrates the ability to make significant improvements in function in a reasonable and predictable amount of time.  Equipment  Recommendations  None recommended by OT    Recommendations for Other Services       Precautions / Restrictions Precautions Precautions: Fall Restrictions Weight Bearing Restrictions: No      Mobility Bed Mobility               General bed mobility comments: up in chair    Transfers Overall transfer level: Needs assistance Equipment used: None Transfers: Sit to/from Stand Sit to Stand: Min guard           General transfer comment: pt stands momentarily before walking, reports this is her habit      Balance Overall balance assessment: Needs assistance   Sitting balance-Leahy Scale: Good       Standing balance-Leahy Scale: Fair                             ADL either performed or assessed with clinical judgement   ADL Overall ADL's : Needs assistance/impaired Eating/Feeding: Independent   Grooming: Supervision/safety;Standing   Upper Body Bathing: Set up;Sitting   Lower Body Bathing: Sit to/from stand;Min guard   Upper Body Dressing : Set up;Sitting   Lower Body Dressing: Min guard;Sit to/from stand   Toilet Transfer: Min guard;Ambulation   Toileting- Clothing Manipulation and Hygiene: Min guard;Sit to/from stand       Functional mobility during ADLs: Min guard (safer with RW) General ADL Comments: encouraged pt to use RW, much less tentative with B UE support     Vision Ability to See in Adequate Light: 0 Adequate Patient Visual Report: No change from baseline       Perception     Praxis      Pertinent Vitals/Pain Pain Assessment Pain Assessment: No/denies pain     Hand Dominance Right  Extremity/Trunk Assessment Upper Extremity Assessment Upper Extremity Assessment: Overall WFL for tasks assessed   Lower Extremity Assessment Lower Extremity Assessment: Defer to PT evaluation   Cervical / Trunk Assessment Cervical / Trunk Assessment: Kyphotic   Communication Communication Communication: No difficulties    Cognition Arousal/Alertness: Awake/alert Behavior During Therapy: WFL for tasks assessed/performed Overall Cognitive Status: Within Functional Limits for tasks assessed                                       General Comments       Exercises     Shoulder Instructions      Home Living Family/patient expects to be discharged to:: Private residence Living Arrangements: Alone Available Help at Discharge: Family; 24 hours Type of Home: House Home Access: Stairs to enter [employer] of Steps: 13 Entrance Stairs-Rails: Left Home Layout: Two level;Able to live on main level with bedroom/bathroom     Bathroom Shower/Tub: Tub/shower unit;Walk-in shower   Bathroom Toilet: Handicapped height     Home Equipment: Conservation officer, nature (2 wheels);Cane - single point;Shower seat          Prior Functioning/Environment Prior Level of Function : Independent/Modified Independent             Mobility Comments: furniture walks in her home, uses RW outside of her home ADLs Comments: independent in self care, light meal prep and light houskeeping        OT Problem List: Impaired balance (sitting and/or standing);Decreased strength      OT Treatment/Interventions: Self-care/ADL training;DME and/or AE instruction;Patient/family education;Balance training    OT Goals(Current goals can be found in the care plan section) Acute Rehab OT Goals OT Goal Formulation: With patient Time For Goal Achievement: 11/15/21 Potential to Achieve Goals: Good ADL Goals Additional ADL Goal #1: Pt will complete basic ADLs modified independently with RW. Additional ADL Goal #2: Pt will state at least 3 fall prevention strategies as instructed.  OT Frequency: Min 2X/week    Co-evaluation              AM-PAC OT "6 Clicks" Daily Activity     Outcome Measure Help from another person eating meals?: None Help from another person taking care of personal grooming?: A Little Help  from another person toileting, which includes using toliet, bedpan, or urinal?: A Little Help from another person bathing (including washing, rinsing, drying)?: None Help from another person to put on and taking off regular upper body clothing?: A Little   6 Click Score: 17   End of Session Equipment Utilized During Treatment: Rolling walker (2 wheels)  Activity Tolerance: Patient tolerated treatment well Patient left: in chair;with call bell/phone within reach;Other (comment) (MD in room)  OT Visit Diagnosis: Muscle weakness (generalized) (M62.81);Unsteadiness on feet (R26.81)                Time: 5188-4166 OT Time Calculation (min): 9 min Charges:  OT General Charges $OT Visit: 1 Visit OT Evaluation $OT Eval Low Complexity: JAARS, OTR/L Acute Rehabilitation Services Office: [phone]   Malka So 11/01/2021, 11:17 AM

## 2021-11-02 ENCOUNTER — Inpatient Hospital Stay (HOSPITAL_COMMUNITY): Payer: Medicare PPO

## 2021-11-02 DIAGNOSIS — E891 Postprocedural hypoinsulinemia: Secondary | ICD-10-CM | POA: Diagnosis not present

## 2021-11-02 DIAGNOSIS — I480 Paroxysmal atrial fibrillation: Secondary | ICD-10-CM | POA: Diagnosis not present

## 2021-11-02 DIAGNOSIS — I5032 Chronic diastolic (congestive) heart failure: Secondary | ICD-10-CM | POA: Diagnosis not present

## 2021-11-02 DIAGNOSIS — R6520 Severe sepsis without septic shock: Secondary | ICD-10-CM

## 2021-11-02 DIAGNOSIS — R6510 Systemic inflammatory response syndrome (SIRS) of non-infectious origin without acute organ dysfunction: Secondary | ICD-10-CM | POA: Diagnosis not present

## 2021-11-02 DIAGNOSIS — K8681 Exocrine pancreatic insufficiency: Secondary | ICD-10-CM

## 2021-11-02 DIAGNOSIS — A401 Sepsis due to streptococcus, group B: Secondary | ICD-10-CM

## 2021-11-02 DIAGNOSIS — E109 Type 1 diabetes mellitus without complications: Secondary | ICD-10-CM

## 2021-11-02 DIAGNOSIS — Z9049 Acquired absence of other specified parts of digestive tract: Secondary | ICD-10-CM

## 2021-11-02 DIAGNOSIS — N151 Renal and perinephric abscess: Secondary | ICD-10-CM

## 2021-11-02 DIAGNOSIS — A419 Sepsis, unspecified organism: Secondary | ICD-10-CM

## 2021-11-02 LAB — RENAL FUNCTION PANEL
Albumin: 2.2 g/dL — ABNORMAL LOW (ref 3.5–5.0)
Anion gap: 18 — ABNORMAL HIGH (ref 5–15)
BUN: 45 mg/dL — ABNORMAL HIGH (ref 8–23)
CO2: 24 mmol/L (ref 22–32)
Calcium: 8.7 mg/dL — ABNORMAL LOW (ref 8.9–10.3)
Chloride: 95 mmol/L — ABNORMAL LOW (ref 98–111)
Creatinine, Ser: 5.77 mg/dL — ABNORMAL HIGH (ref 0.44–1.00)
GFR, Estimated: 7 mL/min — ABNORMAL LOW (ref >60)
Glucose, Bld: 172 mg/dL — ABNORMAL HIGH (ref 70–99)
Phosphorus: 7.3 mg/dL — ABNORMAL HIGH (ref 2.5–4.6)
Potassium: 4.5 mmol/L (ref 3.5–5.1)
Sodium: 137 mmol/L (ref 135–145)

## 2021-11-02 LAB — CBC
HCT: 25.4 % — ABNORMAL LOW (ref 36.0–46.0)
Hemoglobin: 8 g/dL — ABNORMAL LOW (ref 12.0–15.0)
MCH: 27.4 pg (ref 26.0–34.0)
MCHC: 31.5 g/dL (ref 30.0–36.0)
MCV: 87 fL (ref 80.0–100.0)
Platelets: 356 10*3/uL (ref 150–400)
RBC: 2.92 MIL/uL — ABNORMAL LOW (ref 3.87–5.11)
RDW: 16.8 % — ABNORMAL HIGH (ref 11.5–15.5)
WBC: 8.5 10*3/uL (ref 4.0–10.5)
nRBC: 0 % (ref 0.0–0.2)

## 2021-11-02 LAB — GLUCOSE, CAPILLARY
Glucose-Capillary: 162 mg/dL — ABNORMAL HIGH (ref 70–99)
Glucose-Capillary: 123 mg/dL — ABNORMAL HIGH (ref 70–99)
Glucose-Capillary: 240 mg/dL — ABNORMAL HIGH (ref 70–99)
Glucose-Capillary: 203 mg/dL — ABNORMAL HIGH (ref 70–99)

## 2021-11-02 LAB — HEMOGLOBIN A1C
Hgb A1c MFr Bld: 6.1 % — ABNORMAL HIGH (ref 4.8–5.6)
Mean Plasma Glucose: 128.37 mg/dL

## 2021-11-02 LAB — MAGNESIUM: Magnesium: 2.2 mg/dL (ref 1.7–2.4)

## 2021-11-02 MED ORDER — CEFAZOLIN SODIUM-DEXTROSE 2-4 GM/100ML-% IV SOLN
2.0000 g | INTRAVENOUS | Status: DC
  Administered 2021-11-03: 2 g via INTRAVENOUS
  Filled 2021-11-02: qty 100

## 2021-11-02 MED ORDER — CEFAZOLIN SODIUM-DEXTROSE 2-4 GM/100ML-% IV SOLN: 2.0000 g | INTRAVENOUS | Status: DC

## 2021-11-02 MED ORDER — FENTANYL CITRATE (PF) 100 MCG/2ML IJ SOLN
INTRAMUSCULAR | Status: AC
  Filled 2021-11-02: qty 2

## 2021-11-02 MED ORDER — FENTANYL CITRATE (PF) 100 MCG/2ML IJ SOLN
INTRAMUSCULAR | Status: AC | PRN
  Administered 2021-11-02: 50 ug via INTRAVENOUS

## 2021-11-02 MED ORDER — SODIUM CHLORIDE 0.9% FLUSH
5.0000 mL | Freq: Three times a day (TID) | INTRAVENOUS | Status: DC
  Administered 2021-11-03 – 2021-11-04 (×5): 5 mL

## 2021-11-02 MED ORDER — MIDAZOLAM HCL 2 MG/2ML IJ SOLN
INTRAMUSCULAR | Status: AC | PRN
  Administered 2021-11-02: 1 mg via INTRAVENOUS

## 2021-11-02 MED ORDER — MIDAZOLAM HCL 2 MG/2ML IJ SOLN
INTRAMUSCULAR | Status: AC
  Filled 2021-11-02: qty 2

## 2021-11-02 MED ORDER — OXYCODONE HCL 5 MG PO TABS
5.0000 mg | ORAL_TABLET | Freq: Three times a day (TID) | ORAL | Status: DC | PRN
  Administered 2021-11-02: 5 mg via ORAL
  Filled 2021-11-02 (×3): qty 1

## 2021-11-02 NOTE — Procedures (Addendum)
Interventional Radiology Procedure Note  Procedure: Image guided drain placement, left perinephric.  30F pigtail drain.  Complications: None  EBL: None Sample: Culture sent, ~30cc of purulent, sangiunous, fluid  Recommendations: - Routine drain care, with sterile flushes, record output - follow up Cx - routine wound care - OK to advance diet per primary  Signed,  Dulcy Fanny. Earleen Newport, DO

## 2021-11-02 NOTE — Progress Notes (Signed)
PROGRESS NOTE  Kim Burns XBD:[id number] DOB: February 03, 1936   PCP: [name], MD  Patient is from: Home.  Children's stay with her.  Uses rolling walker at baseline.  DOA: 10/29/2021 LOS: 3  Chief complaints Chief Complaint  Patient presents with   Abnormal Lab     Brief Narrative / Interim history: 86 year old F with PMH of ESRD on HD MWF, paroxysmal A-fib on Eliquis, diastolic CHF, nonobstructive CAD, prior CVA, left RCC, prior Whipple procedure with resultant IDDM and HTN presenting with several weeks of low-grade "fever: "And generalized weakness and admitted with SIRS.  Patient had ablation of left renal mass in 08/2021.  CT abdomen and pelvis in ED showed interval development of post ablation changes to left kidney with additional interval development of severe hydronephrosis with obstruction of the proximal ureter.  She also had lactic acidosis and leukocytosis.  Cultures obtained.  She was started on vancomycin and Zosyn.  Nephrology and urology consulted before admission to the hospital.  Blood culture with Streptococcus gallolyticus in 1 out of 2 bottles.  Patient had left ureteral stent placement by urology on 9/13, and drain placed for left perinephric abscess by IR on 9/15.  ID consulted and recommends IV Ancef with HD for 2 weeks, and outpatient follow-up with GI for colonoscopy given association between Streptococcus gallolyticus and colon cancer.  Patient is improving.  Subjective: Seen and examined this afternoon after she returned from drain placement.  No major events overnight of this morning.  Complains left lower back pain after the procedure.  She rates her pain 4/10.  Pain is constant.  Objective: Vitals:   11/02/21 1020 11/02/21 1034 11/02/21 1146 11/02/21 1448  BP: (!) 142/72 127/64 134/65 (!) 140/66  Pulse: 67 66 66 65  Resp: '12 16 16 18  '$ Temp:  (!) 97.5 F (36.4 C) 97.9 F (36.6 C) 98.4 F (36.9 C)  TempSrc:  Oral Oral Oral  SpO2: 100% 100%  97% 99%  Weight:      Height:        Examination:  GENERAL: No apparent distress.  Nontoxic. HEENT: MMM.  Vision and hearing grossly intact.  NECK: Supple.  No apparent JVD.  RESP:  No IWOB.  Fair aeration bilaterally. CVS:  RRR. Heart sounds normal.  ABD/GI/GU: BS+. Abd soft, NTND.  Drain with serosanguineous fluid. MSK/EXT:  Moves extremities. No apparent deformity. No edema.  SKIN: no apparent skin lesion or wound NEURO: Awake and alert. Oriented appropriately.  No apparent focal neuro deficit. PSYCH: Calm. Normal affect.   Procedures:  9/13-left ureteral stent placement 9/15-image guided drain placement for left perinephric abscess  Microbiology summarized: Blood culture with Streptococcus gallolyticus in 1 out of 2 bottles. COVID-19 PCR nonreactive. Abscess culture pending.  Assessment and plan: Principal Problem:   Severe sepsis (HCC) Active Problems:   Hydronephrosis of left kidney   Lactic acidosis   Renal cell carcinoma of left kidney (HCC)   ESRD (end stage renal disease) on dialysis (HCC)   Paroxysmal atrial fibrillation (HCC)   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)   Chronic diastolic CHF (congestive heart failure) (HCC)   Fever   Bacteremia due to Streptococcus   Kidney, perinephric abscess  Severe sepsis due to Streptococcus gallolyticus bacteremia and left perinephric abscess: POA.  Had leukocytosis, tachypnea, lactic acidosis and hypotension.  CT showed left perinephric fluid collection and severe left hydronephrosis. Blood culture with Streptococcus gallolyticus in 1 out of 2 bottles.  Has left ureteral stent and drain  placed.  She is hemodynamically stable.  Lactic acidosis and hypotension resolved.   -Appreciate ID recs -Vanco on 9/11.  Zosyn 9/11-9/14>> Ancef for 2 weeks with HD -Follow abscess cultures -Outpatient follow-up with GI for colonoscopy given association b/n  colon cancer and S.  Gallolyticus -Drain management per  IR.   ESRD on HD MWF -HD per nephrology.   Severe left hydronephrosis -S/p left ureteral stent on 9/13   Controlled IDDM-1 after Whipple procedure: A1c 6.1%. Recent Labs  Lab 11/01/21 1146 11/01/21 1620 11/01/21 2134 11/02/21 0709 11/02/21 1144  GLUCAP 216* 137* 218* 162* 123*  -Continue current insulin regimen  Pancreatic insufficiency after Whipple procedure -Continue Creon  Left kidney renal cell carcinoma:  s/p Embolization of the renal mass 08/22/2021. Severe left hydronephrosis-S/p ureteral stent placement on 9/13 -Per urology   Paroxysmal A-fib: Rate controlled. -Continue home dose of amiodarone, Coreg -Resume Eliquis when okay from IR standpoint   Chronic diastolic CHF: TTE 09/8889 with LVEF of 60 to 65%, G3-DD, severe LAE and RAE and RVSP of 46.  Not on diuretics at home.  Could partly contribute to her generalized weakness.  -Fluid management by HD   Hypotension: Resolved.  -Continue midodrine on HD days  Normocytic anemia: Stable and at baseline. Recent Labs    08/31/21 0518 09/05/21 1837 09/07/21 0521 09/19/21 1825 10/29/21 1709 10/30/21 0226 10/31/21 0748 10/31/21 1513 11/01/21 0630 11/02/21 0558  HGB 8.0* 8.8* 8.1* 7.8* 8.4* 8.0* 7.7* 9.9* 7.8* 8.0*  -Monitor intermittently    Body mass index is 22.95 kg/m.          DVT prophylaxis:    Code Status: Full code Family Communication: None at bedside Level of care: Telemetry Medical Status is: Inpatient Remains inpatient appropriate because: SIRS, obstructive uropathy requiring surgical intervention   Final disposition: TBD Consultants:  Urology Interventional radiology Infectious disease  Sch Meds:  Scheduled Meds:  (feeding supplement) PROSource Plus  30 mL Oral BID BM   amiodarone  200 mg Oral Daily   calcitRIOL  1.25 mcg Oral Q M,W,F-1800   carvedilol  3.125 mg Oral BID   colestipol  1 g Oral Q M,W,F   darbepoetin (ARANESP) injection - DIALYSIS  150 mcg Intravenous Q  Wed-HD   insulin aspart  0-6 Units Subcutaneous TID AC & HS   Lifitegrast  1 drop Both Eyes QHS   lipase/protease/amylase  36,000 Units Oral With snacks   lipase/protease/amylase  72,000 Units Oral TID WC   melatonin  3 mg Oral QHS   midodrine  10 mg Oral Q M,W,F-HD   mupirocin ointment  1 Application Nasal BID   sevelamer carbonate  1,600 mg Oral TID WC   sodium chloride flush  3 mL Intravenous Q12H   sodium chloride flush  5 mL Intracatheter Q8H   Continuous Infusions:   ceFAZolin (ANCEF) IV     PRN Meds:.acetaminophen **OR** acetaminophen, ondansetron **OR** ondansetron (ZOFRAN) IV, oxyCODONE, polyethylene glycol  Antimicrobials: Anti-infectives (From admission, onward)    Start     Dose/Rate Route Frequency Ordered Stop   11/02/21 1800  ceFAZolin (ANCEF) IVPB 2g/100 mL premix        2 g 200 mL/hr over 30 Minutes Intravenous Every M-W-F (Hemodialysis) 11/02/21 1149     11/02/21 1400  ceFAZolin (ANCEF) IVPB 1 g/50 mL premix  Status:  Discontinued        1 g 100 mL/hr over 30 Minutes Intravenous Every 24 hours 11/01/21 1220 11/01/21 1220   11/02/21 1200  ceFAZolin (  ANCEF) IVPB 2g/100 mL premix  Status:  Discontinued        2 g 200 mL/hr over 30 Minutes Intravenous Every Fri (Hemodialysis) 11/02/21 0805 11/02/21 1101   11/02/21 1200  ceFAZolin (ANCEF) IVPB 2g/100 mL premix  Status:  Discontinued        2 g 200 mL/hr over 30 Minutes Intravenous Every M-W-F (Hemodialysis) 11/02/21 1101 11/02/21 1149   11/01/21 1400  ceFAZolin (ANCEF) IVPB 1 g/50 mL premix  Status:  Discontinued        1 g 100 mL/hr over 30 Minutes Intravenous Every 24 hours 11/01/21 1220 11/02/21 0805   11/01/21 1300  ceFAZolin (ANCEF) IVPB 1 g/50 mL premix  Status:  Discontinued        1 g 100 mL/hr over 30 Minutes Intravenous Every 24 hours 11/01/21 1213 11/01/21 1220   10/31/21 1440  ceFAZolin (ANCEF) 2-4 GM/100ML-% IVPB       Note to Pharmacy: [facility], [place]: cabinet override      10/31/21 1440 11/01/21 0244    10/30/21 0800  piperacillin-tazobactam (ZOSYN) IVPB 2.25 g  Status:  Discontinued        2.25 g 100 mL/hr over 30 Minutes Intravenous Every 8 hours 10/30/21 0712 11/01/21 1112   10/30/21 0600  piperacillin-tazobactam (ZOSYN) IVPB 2.25 g  Status:  Discontinued        2.25 g 100 mL/hr over 30 Minutes Intravenous Every 8 hours 10/29/21 2012 10/29/21 2356   10/30/21 0200  piperacillin-tazobactam (ZOSYN) IVPB 3.375 g  Status:  Discontinued        3.375 g 12.5 mL/hr over 240 Minutes Intravenous Every 8 hours 10/29/21 1912 10/29/21 2012   10/29/21 1915  piperacillin-tazobactam (ZOSYN) IVPB 3.375 g        3.375 g 100 mL/hr over 30 Minutes Intravenous  Once 10/29/21 1912 10/29/21 2012   10/29/21 1900  vancomycin (VANCOCIN) IVPB 1000 mg/200 mL premix        1,000 mg 200 mL/hr over 60 Minutes Intravenous  Once 10/29/21 1859 10/29/21 2035        I have personally reviewed the following labs and images: CBC: Recent Labs  Lab 10/29/21 1709 10/30/21 0226 10/31/21 0748 10/31/21 1513 11/01/21 0630 11/02/21 0558  WBC 13.2* 11.5* 11.7*  --  8.4 8.5  NEUTROABS 12.0* 9.4*  --   --   --   --   HGB 8.4* 8.0* 7.7* 9.9* 7.8* 8.0*  HCT 28.3* 26.3* 25.5* 29.0* 25.2* 25.4*  MCV 93.1 91.0 90.7  --  90.0 87.0  PLT 341 326 331  --  337 356   BMP &GFR Recent Labs  Lab 10/29/21 1709 10/30/21 0226 10/31/21 0748 10/31/21 1513 11/01/21 0630 11/02/21 0558  NA 137 139 137 136 137 137  K 4.1 4.2 4.5 4.5 4.4 4.5  CL 97* 99 100 96* 99 95*  CO2 '28 27 24  '$ --  27 24  GLUCOSE 343* 271* 172* 130* 136* 172*  BUN 20 29* 46* 18 25* 45*  CREATININE 3.38* 4.11* 5.41* 2.70* 4.29* 5.77*  CALCIUM 8.6* 8.7* 8.9  --  8.6* 8.7*  MG  --  2.2  --   --  2.1 2.2  PHOS  --   --  6.0*  --  5.9* 7.3*   Estimated Creatinine Clearance: 5.7 mL/min (A) (by C-G formula based on SCr of 5.77 mg/dL (H)). Liver & Pancreas: Recent Labs  Lab 10/29/21 1709 10/30/21 0226 10/31/21 0748 11/01/21 0630 11/02/21 0558  AST 26 15   --   --   --  ALT 19 16  --   --   --   ALKPHOS 67 62  --   --   --   BILITOT 0.6 0.7  --   --   --   PROT 6.6 6.3*  --   --   --   ALBUMIN 2.3* 2.2* 2.0* 2.1* 2.2*   No results for input(s): "LIPASE", "AMYLASE" in the last 168 hours. No results for input(s): "AMMONIA" in the last 168 hours. Diabetic: Recent Labs    11/02/21 0558  HGBA1C 6.1*   Recent Labs  Lab 11/01/21 1146 11/01/21 1620 11/01/21 2134 11/02/21 0709 11/02/21 1144  GLUCAP 216* 137* 218* 162* 123*   Cardiac Enzymes: No results for input(s): "CKTOTAL", "CKMB", "CKMBINDEX", "TROPONINI" in the last 168 hours. No results for input(s): "PROBNP" in the last 8760 hours. Coagulation Profile: Recent Labs  Lab 10/29/21 1709 11/01/21 0630  INR 1.4* 1.2   Thyroid Function Tests: No results for input(s): "TSH", "T4TOTAL", "FREET4", "T3FREE", "THYROIDAB" in the last 72 hours. Lipid Profile: No results for input(s): "CHOL", "HDL", "LDLCALC", "TRIG", "CHOLHDL", "LDLDIRECT" in the last 72 hours. Anemia Panel: No results for input(s): "VITAMINB12", "FOLATE", "FERRITIN", "TIBC", "IRON", "RETICCTPCT" in the last 72 hours. Urine analysis:    Component Value Date/Time   COLORURINE YELLOW 10/29/2021 1530   APPEARANCEUR HAZY (A) 10/29/2021 1530   LABSPEC 1.015 10/29/2021 1530   PHURINE 7.0 10/29/2021 1530   GLUCOSEU NEGATIVE 10/29/2021 1530   HGBUR SMALL (A) 10/29/2021 1530   BILIRUBINUR NEGATIVE 10/29/2021 1530   KETONESUR NEGATIVE 10/29/2021 1530   PROTEINUR >=300 (A) 10/29/2021 1530   UROBILINOGEN 1.0 06/18/2013 1633   NITRITE NEGATIVE 10/29/2021 1530   LEUKOCYTESUR NEGATIVE 10/29/2021 1530   Sepsis Labs: Invalid input(s): "PROCALCITONIN", "LACTICIDVEN"  Microbiology: Recent Results (from the past 240 hour(s))  Culture, blood (Routine x 2)     Status: None (Preliminary result)   Collection Time: 10/29/21  5:10 PM   Specimen: BLOOD  Result Value Ref Range Status   Specimen Description BLOOD SITE NOT  SPECIFIED  Final   Special Requests   Final    BOTTLES DRAWN AEROBIC AND ANAEROBIC Blood Culture adequate volume   Culture   Final    NO GROWTH 4 DAYS Performed at [facility], [address] [place], [place] [address]    Report Status PENDING  Incomplete  SARS Coronavirus 2 by RT PCR (hospital order, performed in [facility] hospital lab) *cepheid single result test* Anterior Nasal Swab     Status: None   Collection Time: 10/29/21  7:00 PM   Specimen: Anterior Nasal Swab  Result Value Ref Range Status   SARS Coronavirus 2 by RT PCR NEGATIVE NEGATIVE Final    Comment: (NOTE) SARS-CoV-2 target nucleic acids are NOT DETECTED.  The SARS-CoV-2 RNA is generally detectable in upper and lower respiratory specimens during the acute phase of infection. The lowest concentration of SARS-CoV-2 viral copies this assay can detect is 250 copies / mL. A negative result does not preclude SARS-CoV-2 infection and should not be used as the sole basis for treatment or other patient management decisions.  A negative result may occur with improper specimen collection / handling, submission of specimen other than nasopharyngeal swab, presence of viral mutation(s) within the areas targeted by this assay, and inadequate number of viral copies (<250 copies / mL). A negative result must be combined with clinical observations, patient history, and epidemiological information.  Fact Sheet for Patients:   https://www.patel.info/  Fact Sheet  for Healthcare Providers: https://hall.com/  This test is not yet approved or  cleared by the [place] and has been authorized for detection and/or diagnosis of SARS-CoV-2 by FDA under an Emergency Use Authorization (EUA).  This EUA will remain in effect (meaning this test can be used) for the duration of the COVID-19 declaration under Section 564(b)(1) of the Act, 21 U.S.C. section 360bbb-3(b)(1), unless the  authorization is terminated or revoked sooner.  Performed at [facility], [place] [address] [place], [place] [address]   Culture, blood (Routine x 2)     Status: Abnormal   Collection Time: 10/29/21  7:16 PM   Specimen: BLOOD  Result Value Ref Range Status   Specimen Description BLOOD RIGHT ANTECUBITAL  Final   Special Requests   Final    BOTTLES DRAWN AEROBIC AND ANAEROBIC Blood Culture results may not be optimal due to an excessive volume of blood received in culture bottles   Culture  Setup Time   Final    GRAM POSITIVE COCCI IN BOTH AEROBIC AND ANAEROBIC BOTTLES CRITICAL RESULT CALLED TO, READ BACK BY AND VERIFIED WITH: PHARMD C.JACKSON AT 1430 ON 10/30/2021 BY T.SAAD.    Culture (A)  Final    STREPTOCOCCUS GALLOLYTICUS THE SIGNIFICANCE OF ISOLATING THIS ORGANISM FROM A SINGLE SET OF BLOOD CULTURES WHEN MULTIPLE SETS ARE DRAWN IS UNCERTAIN. PLEASE NOTIFY THE MICROBIOLOGY DEPARTMENT WITHIN ONE WEEK IF SPECIATION AND SENSITIVITIES ARE REQUIRED. Performed at [facility], [place] [address], [place], [place] [address]    Report Status 11/01/2021 FINAL  Final  Blood Culture ID Panel (Reflexed)     Status: Abnormal   Collection Time: 10/29/21  7:16 PM  Result Value Ref Range Status   Enterococcus faecalis NOT DETECTED NOT DETECTED Final   Enterococcus Faecium NOT DETECTED NOT DETECTED Final   Listeria monocytogenes NOT DETECTED NOT DETECTED Final   Staphylococcus species NOT DETECTED NOT DETECTED Final   Staphylococcus aureus (BCID) NOT DETECTED NOT DETECTED Final   Staphylococcus epidermidis NOT DETECTED NOT DETECTED Final   Staphylococcus lugdunensis NOT DETECTED NOT DETECTED Final   Streptococcus species DETECTED (A) NOT DETECTED Final    Comment: Not Enterococcus species, Streptococcus agalactiae, Streptococcus pyogenes, or Streptococcus pneumoniae. CRITICAL RESULT CALLED TO, READ BACK BY AND VERIFIED WITH: PHARMD C.JACKSON AT 1430 ON 10/30/2021 BY T.SAAD.     Streptococcus agalactiae NOT DETECTED NOT DETECTED Final   Streptococcus pneumoniae NOT DETECTED NOT DETECTED Final   Streptococcus pyogenes NOT DETECTED NOT DETECTED Final   A.calcoaceticus-baumannii NOT DETECTED NOT DETECTED Final   Bacteroides fragilis NOT DETECTED NOT DETECTED Final   Enterobacterales NOT DETECTED NOT DETECTED Final   Enterobacter cloacae complex NOT DETECTED NOT DETECTED Final   Escherichia coli NOT DETECTED NOT DETECTED Final   Klebsiella aerogenes NOT DETECTED NOT DETECTED Final   Klebsiella oxytoca NOT DETECTED NOT DETECTED Final   Klebsiella pneumoniae NOT DETECTED NOT DETECTED Final   Proteus species NOT DETECTED NOT DETECTED Final   Salmonella species NOT DETECTED NOT DETECTED Final   Serratia marcescens NOT DETECTED NOT DETECTED Final   Haemophilus influenzae NOT DETECTED NOT DETECTED Final   Neisseria meningitidis NOT DETECTED NOT DETECTED Final   Pseudomonas aeruginosa NOT DETECTED NOT DETECTED Final   Stenotrophomonas maltophilia NOT DETECTED NOT DETECTED Final   Candida albicans NOT DETECTED NOT DETECTED Final   Candida auris NOT DETECTED NOT DETECTED Final   Candida glabrata NOT DETECTED NOT DETECTED Final   Candida krusei NOT DETECTED NOT DETECTED Final  Candida parapsilosis NOT DETECTED NOT DETECTED Final   Candida tropicalis NOT DETECTED NOT DETECTED Final   Cryptococcus neoformans/gattii NOT DETECTED NOT DETECTED Final    Comment: Performed at [facility], [address] [place], [place] [address]  Surgical PCR screen     Status: Abnormal   Collection Time: 10/31/21  2:42 PM   Specimen: Nasal Mucosa; Nasal Swab  Result Value Ref Range Status   MRSA, PCR NEGATIVE NEGATIVE Final   Staphylococcus aureus POSITIVE (A) NEGATIVE Final    Comment: (NOTE) The Xpert SA Assay (FDA approved for NASAL specimens in patients 53 years of age and older), is one component of a comprehensive surveillance program. It is not intended to diagnose infection  nor to guide or monitor treatment. Performed at [facility], [place] [address] [place], [place] [address]   Aerobic/Anaerobic Culture w Gram Stain (surgical/deep wound)     Status: None (Preliminary result)   Collection Time: 11/02/21 10:10 AM   Specimen: Abdomen; Abscess  Result Value Ref Range Status   Specimen Description ABDOMEN ABSCESS  Final   Special Requests  PERINEPHRIC FLUID  Final   Gram Stain   Final    ABUNDANT WBC PRESENT,BOTH PMN AND MONONUCLEAR NO ORGANISMS SEEN Performed at [facility], [address], [place], [place] [address]    Culture PENDING  Incomplete   Report Status PENDING  Incomplete    Radiology Studies: CT GUIDED PERITONEAL/RETROPERITONEAL FLUID DRAIN BY PERC CATH  Result Date: 11/02/2021 INDICATION: 85 year old female with perinephric fluid collection, presents for drainage. EXAM: CT-GUIDED DRAINAGE OF LEFT PERINEPHRIC FLUID TECHNIQUE: Multidetector CT imaging of the abdomen was performed following the standard protocol without IV contrast. RADIATION DOSE REDUCTION: This exam was performed according to the departmental dose-optimization program which includes automated exposure control, adjustment of the mA and/or kV according to patient size and/or use of iterative reconstruction technique. MEDICATIONS: None ANESTHESIA/SEDATION: Moderate (conscious) sedation was employed during this procedure. A total of Versed 1.0 mg and Fentanyl 50 mcg was administered intravenously by the radiology nurse. Total intra-service moderate Sedation Time: 14 minutes. The patient's level of consciousness and vital signs were monitored continuously by radiology nursing throughout the procedure under my direct supervision. COMPLICATIONS: None PROCEDURE: Informed written consent was obtained from the patient after a thorough discussion of the procedural risks, benefits and alternatives. All questions were addressed. Maximal Sterile Barrier Technique was utilized including  caps, mask, sterile gowns, sterile gloves, sterile drape, hand hygiene and skin antiseptic. A timeout was performed prior to the initiation of the procedure. Patient position prone on the CT gantry table. Scout CT acquired for planning purposes. Using CT guidance, trocar needle was advanced into the fluid collection in the left perinephric space. Modified Seldinger technique was used to place a 10 [place] drain. Proximally 30 cc of complex/purulent sanguinous fluid aspirated. Culture was sent to the lab. Drain was attached to bulb suction and sutured in position. Final images were stored. Patient tolerated the procedure well and remained hemodynamically stable throughout. No complications were encountered and no significant blood loss. IMPRESSION: Status post CT-guided drainage of left perinephric fluid. Signed, Dulcy Fanny. Nadene Rubins, RPVI [facility] Radiology Electronically Signed   By: Corrie Mckusick D.O.   On: 11/02/2021 12:47      Landynn Dupler T. [place]  If 7PM-7AM, please contact night-coverage [email or web address] 11/02/2021, 3:01 PM

## 2021-11-02 NOTE — Progress Notes (Signed)
[facility] for Infectious Disease   Reason for visit: Follow up on abscess  Interval History: s/p aspiration by IR of purulent fluid; WBC 8.5, remains afebrile  Physical Exam: Constitutional:  Vitals:   11/02/21 1020 11/02/21 1034  BP: (!) 142/72 127/64  Pulse: 67 66  Resp: 12 16  Temp:  (!) 97.5 F (36.4 C)  SpO2: 100% 100%   patient appears in NAD Respiratory: Normal respiratory effort  Review of Systems: Constitutional: negative for fevers and chills  Lab Results  Component Value Date   WBC 8.5 11/02/2021   HGB 8.0 (L) 11/02/2021   HCT 25.4 (L) 11/02/2021   MCV 87.0 11/02/2021   PLT 356 11/02/2021    Lab Results  Component Value Date   CREATININE 5.77 (H) 11/02/2021   BUN 45 (H) 11/02/2021   NA 137 11/02/2021   K 4.5 11/02/2021   CL 95 (L) 11/02/2021   CO2 24 11/02/2021    Lab Results  Component Value Date   ALT 16 10/30/2021   AST 15 10/30/2021   ALKPHOS 62 10/30/2021     Microbiology: Recent Results (from the past 240 hour(s))  Culture, blood (Routine x 2)     Status: None (Preliminary result)   Collection Time: 10/29/21  5:10 PM   Specimen: BLOOD  Result Value Ref Range Status   Specimen Description BLOOD SITE NOT SPECIFIED  Final   Special Requests   Final    BOTTLES DRAWN AEROBIC AND ANAEROBIC Blood Culture adequate volume   Culture   Final    NO GROWTH 4 DAYS Performed at [facility], [address] [place], [place] [address]    Report Status PENDING  Incomplete  SARS Coronavirus 2 by RT PCR (hospital order, performed in [facility] hospital lab) *cepheid single result test* Anterior Nasal Swab     Status: None   Collection Time: 10/29/21  7:00 PM   Specimen: Anterior Nasal Swab  Result Value Ref Range Status   SARS Coronavirus 2 by RT PCR NEGATIVE NEGATIVE Final    Comment: (NOTE) SARS-CoV-2 target nucleic acids are NOT DETECTED.  The SARS-CoV-2 RNA is generally detectable in upper and lower respiratory specimens during  the acute phase of infection. The lowest concentration of SARS-CoV-2 viral copies this assay can detect is 250 copies / mL. A negative result does not preclude SARS-CoV-2 infection and should not be used as the sole basis for treatment or other patient management decisions.  A negative result may occur with improper specimen collection / handling, submission of specimen other than nasopharyngeal swab, presence of viral mutation(s) within the areas targeted by this assay, and inadequate number of viral copies (<250 copies / mL). A negative result must be combined with clinical observations, patient history, and epidemiological information.  Fact Sheet for Patients:   https://www.patel.info/  Fact Sheet for Healthcare Providers: https://hall.com/  This test is not yet approved or  cleared by the [place] FDA and has been authorized for detection and/or diagnosis of SARS-CoV-2 by FDA under an Emergency Use Authorization (EUA).  This EUA will remain in effect (meaning this test can be used) for the duration of the COVID-19 declaration under Section 564(b)(1) of the Act, 21 U.S.C. section 360bbb-3(b)(1), unless the authorization is terminated or revoked sooner.  Performed at [facility], [place] [address], [place], [place] [address]   Culture, blood (Routine x 2)     Status: Abnormal   Collection Time: 10/29/21  7:16 PM  Specimen: BLOOD  Result Value Ref Range Status   Specimen Description BLOOD RIGHT ANTECUBITAL  Final   Special Requests   Final    BOTTLES DRAWN AEROBIC AND ANAEROBIC Blood Culture results may not be optimal due to an excessive volume of blood received in culture bottles   Culture  Setup Time   Final    GRAM POSITIVE COCCI IN BOTH AEROBIC AND ANAEROBIC BOTTLES CRITICAL RESULT CALLED TO, READ BACK BY AND VERIFIED WITH: PHARMD C.JACKSON AT 1430 ON 10/30/2021 BY T.SAAD.    Culture (A)  Final    STREPTOCOCCUS  GALLOLYTICUS THE SIGNIFICANCE OF ISOLATING THIS ORGANISM FROM A SINGLE SET OF BLOOD CULTURES WHEN MULTIPLE SETS ARE DRAWN IS UNCERTAIN. PLEASE NOTIFY THE MICROBIOLOGY DEPARTMENT WITHIN ONE WEEK IF SPECIATION AND SENSITIVITIES ARE REQUIRED. Performed at [facility], [place] [address], [place], [place] [address]    Report Status 11/01/2021 FINAL  Final  Blood Culture ID Panel (Reflexed)     Status: Abnormal   Collection Time: 10/29/21  7:16 PM  Result Value Ref Range Status   Enterococcus faecalis NOT DETECTED NOT DETECTED Final   Enterococcus Faecium NOT DETECTED NOT DETECTED Final   Listeria monocytogenes NOT DETECTED NOT DETECTED Final   Staphylococcus species NOT DETECTED NOT DETECTED Final   Staphylococcus aureus (BCID) NOT DETECTED NOT DETECTED Final   Staphylococcus epidermidis NOT DETECTED NOT DETECTED Final   Staphylococcus lugdunensis NOT DETECTED NOT DETECTED Final   Streptococcus species DETECTED (A) NOT DETECTED Final    Comment: Not Enterococcus species, Streptococcus agalactiae, Streptococcus pyogenes, or Streptococcus pneumoniae. CRITICAL RESULT CALLED TO, READ BACK BY AND VERIFIED WITH: PHARMD C.JACKSON AT 1430 ON 10/30/2021 BY T.SAAD.    Streptococcus agalactiae NOT DETECTED NOT DETECTED Final   Streptococcus pneumoniae NOT DETECTED NOT DETECTED Final   Streptococcus pyogenes NOT DETECTED NOT DETECTED Final   A.calcoaceticus-baumannii NOT DETECTED NOT DETECTED Final   Bacteroides fragilis NOT DETECTED NOT DETECTED Final   Enterobacterales NOT DETECTED NOT DETECTED Final   Enterobacter cloacae complex NOT DETECTED NOT DETECTED Final   Escherichia coli NOT DETECTED NOT DETECTED Final   Klebsiella aerogenes NOT DETECTED NOT DETECTED Final   Klebsiella oxytoca NOT DETECTED NOT DETECTED Final   Klebsiella pneumoniae NOT DETECTED NOT DETECTED Final   Proteus species NOT DETECTED NOT DETECTED Final   Salmonella species NOT DETECTED NOT DETECTED Final   Serratia  marcescens NOT DETECTED NOT DETECTED Final   Haemophilus influenzae NOT DETECTED NOT DETECTED Final   Neisseria meningitidis NOT DETECTED NOT DETECTED Final   Pseudomonas aeruginosa NOT DETECTED NOT DETECTED Final   Stenotrophomonas maltophilia NOT DETECTED NOT DETECTED Final   Candida albicans NOT DETECTED NOT DETECTED Final   Candida auris NOT DETECTED NOT DETECTED Final   Candida glabrata NOT DETECTED NOT DETECTED Final   Candida krusei NOT DETECTED NOT DETECTED Final   Candida parapsilosis NOT DETECTED NOT DETECTED Final   Candida tropicalis NOT DETECTED NOT DETECTED Final   Cryptococcus neoformans/gattii NOT DETECTED NOT DETECTED Final    Comment: Performed at [facility], [address] [place], [place] [address]  Surgical PCR screen     Status: Abnormal   Collection Time: 10/31/21  2:42 PM   Specimen: Nasal Mucosa; Nasal Swab  Result Value Ref Range Status   MRSA, PCR NEGATIVE NEGATIVE Final   Staphylococcus aureus POSITIVE (A) NEGATIVE Final    Comment: (NOTE) The Xpert SA Assay (FDA approved for NASAL specimens in patients 44 years of age and older),  is one component of a comprehensive surveillance program. It is not intended to diagnose infection nor to guide or monitor treatment. Performed at [facility], [place] [address] [place], [place] [address]     Impression/Plan:  1. Abscess - new finding and now noted to be an abscess at site of fluid collection and culture sent.  On cefazolin and will plan on 2 weeks with dialysis with the finding of the infected fluid.  Will monitor the culture for any growth.    2.  Strep gallolyticus positive blood culture - no new growth.  Noted in one set.  Discussed its indication for a colonoscopy which the patient has refused.  Can be discussed with Dr. Paulita Fujita as an outpatient.

## 2021-11-02 NOTE — Consult Note (Signed)
   [facility] CM Inpatient Consult   11/02/2021  Kim Burns [birth year] [id number]  [employer] [ACO] Patient: [employer]  Primary Care Provider:  [name], MD, is a provider listed for the Transition of Care [TOC] follow up   Patient screened for hospitalization with noted extreme high risk score for unplanned readmission risk and to assess for potential [employer] service needs for post hospital transition.  Review of patient's medical record reveals patient has had contact for transportation information from a [place] recently. However noted in the inpatient [facility] notes that the patient's children does have transportation with assist when patient doesn't drive herself.   Spoke with patient via phone as she states her concerns for transportation is with having HD at 6:40 am and she doesn't drive in the dark or in the winter. States, she "is not allowed to drive right now. She states it's best for [facility] to follow up with her son [name], who is handling the transportation request.  Review of electronic medical record reveals ongoing medical management needs, patient has HD on MWF noted.  Plan:  Continue to follow up with [facility] team and ongoing progress and disposition to assess for post hospital care management needs.    For questions contact:   Natividad Brood, RN BSN [facility]  [phone] business mobile phone Toll free office [phone]  Fax number: [phone] [email or web address]

## 2021-11-02 NOTE — Progress Notes (Signed)
[facility] Progress Note   Subjective:  Seen in room. No new complaints this am. Reports she was up and walking hallways with PT yesterday. IR to place perinephric drain this am. Dialysis later today.   Objective Vitals:   11/01/21 1621 11/01/21 2136 11/02/21 0322 11/02/21 0711  BP: 128/70 133/64 136/64 (!) 142/77  Pulse: 68 70 66 68  Resp: '18 16 16 18  '$ Temp: 98.4 F (36.9 C) 97.9 F (36.6 C) (!) 97.5 F (36.4 C) (!) 97.3 F (36.3 C)  TempSrc:  Oral Oral Oral  SpO2: 99% 97% 98% 100%  Weight:      Height:       Physical Exam General: Well appearing woman, NAD. Room air. Heart: RRR; 3/6 murmur Lungs: CTAB  Abdomen: soft, non-tender Extremities: No LE edema Dialysis Access:  LUE AVF + bruit  Additional Objective Labs: Basic Metabolic Panel: Recent Labs  Lab 10/31/21 0748 10/31/21 1513 11/01/21 0630 11/02/21 0558  NA 137 136 137 137  K 4.5 4.5 4.4 4.5  CL 100 96* 99 95*  CO2 24  --  27 24  GLUCOSE 172* 130* 136* 172*  BUN 46* 18 25* 45*  CREATININE 5.41* 2.70* 4.29* 5.77*  CALCIUM 8.9  --  8.6* 8.7*  PHOS 6.0*  --  5.9* 7.3*    Liver Function Tests: Recent Labs  Lab 10/29/21 1709 10/30/21 0226 10/31/21 0748 11/01/21 0630 11/02/21 0558  AST 26 15  --   --   --   ALT 19 16  --   --   --   ALKPHOS 67 62  --   --   --   BILITOT 0.6 0.7  --   --   --   PROT 6.6 6.3*  --   --   --   ALBUMIN 2.3* 2.2* 2.0* 2.1* 2.2*    CBC: Recent Labs  Lab 10/29/21 1709 10/30/21 0226 10/31/21 0748 10/31/21 1513 11/01/21 0630 11/02/21 0558  WBC 13.2* 11.5* 11.7*  --  8.4 8.5  NEUTROABS 12.0* 9.4*  --   --   --   --   HGB 8.4* 8.0* 7.7* 9.9* 7.8* 8.0*  HCT 28.3* 26.3* 25.5* 29.0* 25.2* 25.4*  MCV 93.1 91.0 90.7  --  90.0 87.0  PLT 341 326 331  --  337 356    Blood Culture    Component Value Date/Time   SDES BLOOD RIGHT ANTECUBITAL 10/29/2021 1916   SPECREQUEST  10/29/2021 1916    BOTTLES DRAWN AEROBIC AND ANAEROBIC Blood Culture results may  not be optimal due to an excessive volume of blood received in culture bottles   CULT (A) 10/29/2021 1916    STREPTOCOCCUS GALLOLYTICUS THE SIGNIFICANCE OF ISOLATING THIS ORGANISM FROM A SINGLE SET OF BLOOD CULTURES WHEN MULTIPLE SETS ARE DRAWN IS UNCERTAIN. PLEASE NOTIFY THE MICROBIOLOGY DEPARTMENT WITHIN ONE WEEK IF SPECIATION AND SENSITIVITIES ARE REQUIRED. Performed at [facility], [place] [address], [place], [place] [address]    REPTSTATUS 11/01/2021 FINAL 10/29/2021 1916   Studies/Results: DG C-Arm 1-60 Min  Result Date: 10/31/2021 CLINICAL DATA:  Cystoscopy with retrograde pyelogram/ureteral stent placement. EXAM: DG C-ARM 1-60 MIN CONTRAST:  Intraoperative study.  Information not given. FLUOROSCOPY: Fluoroscopy Time:  2 minutes 2nd Radiation Exposure Index (if provided by the fluoroscopic device): 13.48 mGy Number of Acquired Spot Images: 0 COMPARISON:  CT 10/29/2021 FINDINGS: Multiple intraoperative spot images demonstrate decompressed right ureter. Severe right hydronephrosis. Final images demonstrate placement of left ureteral stent. IMPRESSION: Intraoperative  imaging as above. Electronically Signed   By: Rolm Baptise M.D.   On: 10/31/2021 17:32    Medications:   ceFAZolin (ANCEF) IV      (feeding supplement) PROSource Plus  30 mL Oral BID BM   amiodarone  200 mg Oral Daily   calcitRIOL  1.25 mcg Oral Q M,W,F-1800   carvedilol  3.125 mg Oral BID   colestipol  1 g Oral Q M,W,F   darbepoetin (ARANESP) injection - DIALYSIS  150 mcg Intravenous Q Wed-HD   insulin aspart  0-6 Units Subcutaneous TID AC & HS   Lifitegrast  1 drop Both Eyes QHS   lipase/protease/amylase  36,000 Units Oral With snacks   lipase/protease/amylase  72,000 Units Oral TID WC   melatonin  3 mg Oral QHS   midodrine  10 mg Oral Q M,W,F-HD   mupirocin ointment  1 Application Nasal BID   sevelamer carbonate  1,600 mg Oral TID WC   sodium chloride flush  3 mL Intravenous Q12H    Dialysis Orders: MWF at [facility] -> full HD 9/11, post-wt 57.3kg 3:45hr, 350/A1.5, EDW 57.5kg, 2K/2Ca bath, LUE AVF, 16g needles, no heparin - Mircera 35mg IV q 2 weeks (last given 9/1) - last Hgb 8.1 on 9/6 - Venofer '50mg'$  IV weekly - Calcitriol 1.234m PO q HD - HBsAg negative on 09/12/21   Assessment/Plan:  L hydronephrosis/urine leak from thermal injury: Recent coiling embolization of L renal tumor on 08/22/21. 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  Urology performed surgery 9/13 - ureter was blocked, stent placed. + hematuria. IR to place drain today  Per urology and primary team.  ESRD:  Continue HD on usual MWF schedule - next 9/15.  BP/volume: Gets midodrine pre-HD, also on BB for A-fib. No edema. Follow.  Anemia of ESRD: Hgb low - 8, was same range last week as outpatient. Aranesp 15022mgiven 9/13. Transfuse if drops < 7. Hgb 8.0 today -stable   Metabolic bone disease: CorrCa ok, Phos a little high. Continue home binders + VDRA.  Nutrition:  Alb low, continue supplements while here.  A-fib: Dx in 08/2021, on amiodarone, BB, Eliquis BID. Seems to be in NSR this admit.  Insomnia: Chronic issue, asking for something to help tonight, will message hospitalist and order melatonin for her.  [facility]ynnda [facility] 11/02/2021,9:02 AM

## 2021-11-03 DIAGNOSIS — I951 Orthostatic hypotension: Secondary | ICD-10-CM

## 2021-11-03 DIAGNOSIS — R6510 Systemic inflammatory response syndrome (SIRS) of non-infectious origin without acute organ dysfunction: Secondary | ICD-10-CM | POA: Diagnosis not present

## 2021-11-03 DIAGNOSIS — I480 Paroxysmal atrial fibrillation: Secondary | ICD-10-CM | POA: Diagnosis not present

## 2021-11-03 DIAGNOSIS — A419 Sepsis, unspecified organism: Secondary | ICD-10-CM | POA: Diagnosis not present

## 2021-11-03 DIAGNOSIS — R6520 Severe sepsis without septic shock: Secondary | ICD-10-CM | POA: Diagnosis not present

## 2021-11-03 DIAGNOSIS — I5032 Chronic diastolic (congestive) heart failure: Secondary | ICD-10-CM | POA: Diagnosis not present

## 2021-11-03 DIAGNOSIS — E891 Postprocedural hypoinsulinemia: Secondary | ICD-10-CM | POA: Diagnosis not present

## 2021-11-03 LAB — GLUCOSE, CAPILLARY
Glucose-Capillary: 102 mg/dL — ABNORMAL HIGH (ref 70–99)
Glucose-Capillary: 208 mg/dL — ABNORMAL HIGH (ref 70–99)
Glucose-Capillary: 230 mg/dL — ABNORMAL HIGH (ref 70–99)
Glucose-Capillary: 193 mg/dL — ABNORMAL HIGH (ref 70–99)
Glucose-Capillary: 200 mg/dL — ABNORMAL HIGH (ref 70–99)
Glucose-Capillary: 175 mg/dL — ABNORMAL HIGH (ref 70–99)

## 2021-11-03 LAB — CULTURE, BLOOD (ROUTINE X 2)
Culture: NO GROWTH
Special Requests: ADEQUATE

## 2021-11-03 MED ORDER — APIXABAN 2.5 MG PO TABS
2.5000 mg | ORAL_TABLET | Freq: Two times a day (BID) | ORAL | Status: DC
  Administered 2021-11-03 – 2021-11-04 (×3): 2.5 mg via ORAL
  Filled 2021-11-03 (×3): qty 1

## 2021-11-03 MED ORDER — POLYVINYL ALCOHOL 1.4 % OP SOLN
1.0000 [drp] | OPHTHALMIC | Status: DC | PRN
  Filled 2021-11-03: qty 15

## 2021-11-03 MED ORDER — INSULIN GLARGINE-YFGN 100 UNIT/ML ~~LOC~~ SOLN
4.0000 [IU] | Freq: Every day | SUBCUTANEOUS | Status: DC
  Administered 2021-11-03 – 2021-11-04 (×2): 4 [IU] via SUBCUTANEOUS
  Filled 2021-11-03 (×2): qty 0.04

## 2021-11-03 NOTE — Progress Notes (Signed)
Mobility Specialist Progress Note:   11/03/21 1256  Mobility  Activity Ambulated with assistance in hallway  Level of Assistance Standby assist, set-up cues, supervision of patient - no hands on  Assistive Device Front wheel walker  Distance Ambulated (ft) 200 ft  Activity Response Tolerated well  $Mobility charge 1 Mobility   Orthostatic Vitals  Lying: N/a Sitting: 120/60 Standing 0 min: 119/61 Standing 3 mins: 133/59  HR: 60 SpO2: 100  Pt received in bed and eager. Per request of RN, took orthostatic vitals. No complaints but did state she was dizzy earlier today. Pt left in chair with all needs met and RN in room.   [name] Mobility Specialist-Acute Rehab Secure Chat only

## 2021-11-03 NOTE — Progress Notes (Signed)
    [facility] for Infectious Disease   Reason for visit: Follow up on abscess  Interval History: no growth in culture to date  Physical Exam: Constitutional:  Vitals:   11/03/21 0458 11/03/21 0902  BP: (!) 154/63 (!) 146/87  Pulse: 68 66  Resp: 18 18  Temp: 97.7 F (36.5 C) 97.8 F (36.6 C)  SpO2: 97% 100%   patient appears in NAD  Impression: abscess and no growth.  If it remains negative, plan for 2 weeks of cefazolin with dialysis from date of drainage.   Plan: 1.  Continue cefazolin

## 2021-11-03 NOTE — Progress Notes (Signed)
PROGRESS NOTE  Kim Burns OIZ:[id number] DOB: 1935/11/22   PCP: [name], MD  Patient is from: Home.  Children's stay with her.  Uses rolling walker at baseline.  DOA: 10/29/2021 LOS: 4  Chief complaints Chief Complaint  Patient presents with   Abnormal Lab     Brief Narrative / Interim history: 86 year old F with PMH of ESRD on HD MWF, paroxysmal A-fib on Eliquis, diastolic CHF, nonobstructive CAD, prior CVA, left RCC, prior Whipple procedure with resultant IDDM and HTN presenting with several weeks of low-grade "fever: "And generalized weakness and admitted with SIRS.  Patient had ablation of left renal mass in 08/2021.  CT abdomen and pelvis in ED showed interval development of post ablation changes to left kidney with additional interval development of severe hydronephrosis with obstruction of the proximal ureter.  She also had lactic acidosis and leukocytosis.  Cultures obtained.  She was started on vancomycin and Zosyn.  Nephrology and urology consulted before admission to the hospital.  Blood culture with Streptococcus gallolyticus in 1 out of 2 bottles.  Patient had left ureteral stent placement by urology on 9/13, and drain placed for left perinephric abscess by IR on 9/15.  ID consulted and recommends IV Ancef with HD for 2 weeks, and outpatient follow-up with GI for colonoscopy given association between Streptococcus gallolyticus and colon cancer.  Patient is improving.  Subjective: Seen and examined earlier this morning.  Reports dizziness that she describes as spinning after her dialysis.  This is not new for her.  She had 1 L UF last night.  Denies chest pain, dyspnea, GI or UTI symptoms.  Pain improved.  Objective: Vitals:   11/03/21 0113 11/03/21 0128 11/03/21 0458 11/03/21 0902  BP: (!) 143/73 (!) 146/69 (!) 154/63 (!) 146/87  Pulse: 72 71 68 66  Resp: '20 16 18 18  '$ Temp:  97.6 F (36.4 C) 97.7 F (36.5 C) 97.8 F (36.6 C)  TempSrc:  Oral Oral Oral   SpO2: 100% 98% 97% 100%  Weight:      Height:        Examination:  GENERAL: No apparent distress.  Nontoxic. HEENT: MMM.  Vision and hearing grossly intact.  NECK: Supple.  No apparent JVD.  RESP:  No IWOB.  Fair aeration bilaterally. CVS:  RRR. Heart sounds normal.  ABD/GI/GU: BS+. Abd soft, NTND.  Drain with serosanguineous fluid. MSK/EXT:  Moves extremities. No apparent deformity. No edema.  SKIN: no apparent skin lesion or wound NEURO: Awake and alert. Oriented appropriately.  No apparent focal neuro deficit. PSYCH: Calm. Normal affect.   Procedures:  9/13-left ureteral stent placement 9/15-image guided drain placement for left perinephric abscess  Microbiology summarized: Blood culture with Streptococcus gallolyticus in 1 out of 2 bottles. COVID-19 PCR nonreactive. Abscess culture NGTD.  Assessment and plan: Principal Problem:   Severe sepsis (HCC) Active Problems:   Hydronephrosis of left kidney   Lactic acidosis   Renal cell carcinoma of left kidney (HCC)   ESRD (end stage renal disease) on dialysis (HCC)   Paroxysmal atrial fibrillation (HCC)   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)   Chronic diastolic CHF (congestive heart failure) (HCC)   Fever   Bacteremia due to Streptococcus   Kidney, perinephric abscess  Severe sepsis due to Streptococcus gallolyticus bacteremia and left perinephric abscess: POA.  Had leukocytosis, tachypnea, lactic acidosis and hypotension.  CT showed left perinephric fluid collection and severe left hydronephrosis. Blood culture with Streptococcus gallolyticus in 1 out of 2 bottles.  Has left ureteral stent and drain placed.  Abscess culture NGTD.  She is hemodynamically stable.  Lactic acidosis and hypotension resolved.   -Appreciate ID recs -Vanco on 9/11.  Zosyn 9/11-9/14>> Ancef for 2 weeks with HD -Follow-up with GI for colonoscopy given association b/n  colon cancer and S.  Gallolyticus -Drain management per  IR.   ESRD on HD MWF -HD per nephrology.   Severe left hydronephrosis -S/p left ureteral stent on 9/13  Orthostasis?  Reports dizziness that she describes as spinning.  Unusual after HD for her.  Neuro exam without significant finding.  BP slightly elevated at rest. -Check orthostatic vitals -Agree with holding carvedilol the day of HD.  Could also change to metoprolol -Continue home midodrine -Consider compression weights   Controlled IDDM-1 after Whipple procedure: A1c 6.1%. Recent Labs  Lab 11/02/21 2027 11/03/21 0126 11/03/21 0503 11/03/21 0738 11/03/21 1121  GLUCAP 203* 102* 208* 230* 193*  -Continue current insulin regimen  Pancreatic insufficiency after Whipple procedure -Continue Creon  Left kidney renal cell carcinoma:  s/p Embolization of the renal mass 08/22/2021. Severe left hydronephrosis-S/p ureteral stent placement on 9/13 -Per urology   Paroxysmal A-fib: Rate controlled. -Continue home dose of amiodarone, Coreg.  May have to hold Coreg on the day of HD if orthostatic -Resume Eliquis   Chronic diastolic CHF: TTE 09/7679 with LVEF of 60 to 65%, G3-DD, severe LAE and RAE and RVSP of 46.  Not on diuretics at home.  Could partly contribute to her generalized weakness.  -Fluid management by HD  Normocytic anemia: Stable and at baseline. Recent Labs    08/31/21 0518 09/05/21 1837 09/07/21 0521 09/19/21 1825 10/29/21 1709 10/30/21 0226 10/31/21 0748 10/31/21 1513 11/01/21 0630 11/02/21 0558  HGB 8.0* 8.8* 8.1* 7.8* 8.4* 8.0* 7.7* 9.9* 7.8* 8.0*  -Monitor intermittently   Body mass index is 23.71 kg/m.          DVT prophylaxis:    Code Status: Full code Family Communication: None at bedside Level of care: Med-Surg Status is: Inpatient Remains inpatient appropriate because: SIRS, obstructive uropathy requiring surgical intervention   Final disposition: TBD Consultants:  Urology Interventional radiology Infectious disease  Sch Meds:   Scheduled Meds:  (feeding supplement) PROSource Plus  30 mL Oral BID BM   amiodarone  200 mg Oral Daily   calcitRIOL  1.25 mcg Oral Q M,W,F-1800   carvedilol  3.125 mg Oral BID   colestipol  1 g Oral Q M,W,F   darbepoetin (ARANESP) injection - DIALYSIS  150 mcg Intravenous Q Wed-HD   insulin aspart  0-6 Units Subcutaneous TID AC & HS   insulin glargine-yfgn  4 Units Subcutaneous Daily   Lifitegrast  1 drop Both Eyes QHS   lipase/protease/amylase  36,000 Units Oral With snacks   lipase/protease/amylase  72,000 Units Oral TID WC   melatonin  3 mg Oral QHS   midodrine  10 mg Oral Q M,W,F-HD   mupirocin ointment  1 Application Nasal BID   sevelamer carbonate  1,600 mg Oral TID WC   sodium chloride flush  3 mL Intravenous Q12H   sodium chloride flush  5 mL Intracatheter Q8H   Continuous Infusions:   ceFAZolin (ANCEF) IV 2 g (11/03/21 0200)   PRN Meds:.acetaminophen **OR** acetaminophen, ondansetron **OR** ondansetron (ZOFRAN) IV, oxyCODONE, polyethylene glycol  Antimicrobials: Anti-infectives (From admission, onward)    Start     Dose/Rate Route Frequency Ordered Stop   11/02/21 1800  ceFAZolin (ANCEF) IVPB 2g/100 mL premix  2 g 200 mL/hr over 30 Minutes Intravenous Every M-W-F (Hemodialysis) 11/02/21 1149     11/02/21 1400  ceFAZolin (ANCEF) IVPB 1 g/50 mL premix  Status:  Discontinued        1 g 100 mL/hr over 30 Minutes Intravenous Every 24 hours 11/01/21 1220 11/01/21 1220   11/02/21 1200  ceFAZolin (ANCEF) IVPB 2g/100 mL premix  Status:  Discontinued        2 g 200 mL/hr over 30 Minutes Intravenous Every Fri (Hemodialysis) 11/02/21 0805 11/02/21 1101   11/02/21 1200  ceFAZolin (ANCEF) IVPB 2g/100 mL premix  Status:  Discontinued        2 g 200 mL/hr over 30 Minutes Intravenous Every M-W-F (Hemodialysis) 11/02/21 1101 11/02/21 1149   11/01/21 1400  ceFAZolin (ANCEF) IVPB 1 g/50 mL premix  Status:  Discontinued        1 g 100 mL/hr over 30 Minutes Intravenous Every 24  hours 11/01/21 1220 11/02/21 0805   11/01/21 1300  ceFAZolin (ANCEF) IVPB 1 g/50 mL premix  Status:  Discontinued        1 g 100 mL/hr over 30 Minutes Intravenous Every 24 hours 11/01/21 1213 11/01/21 1220   10/31/21 1440  ceFAZolin (ANCEF) 2-4 GM/100ML-% IVPB       Note to Pharmacy: [facility], [place]: cabinet override      10/31/21 1440 11/01/21 0244   10/30/21 0800  piperacillin-tazobactam (ZOSYN) IVPB 2.25 g  Status:  Discontinued        2.25 g 100 mL/hr over 30 Minutes Intravenous Every 8 hours 10/30/21 0712 11/01/21 1112   10/30/21 0600  piperacillin-tazobactam (ZOSYN) IVPB 2.25 g  Status:  Discontinued        2.25 g 100 mL/hr over 30 Minutes Intravenous Every 8 hours 10/29/21 2012 10/29/21 2356   10/30/21 0200  piperacillin-tazobactam (ZOSYN) IVPB 3.375 g  Status:  Discontinued        3.375 g 12.5 mL/hr over 240 Minutes Intravenous Every 8 hours 10/29/21 1912 10/29/21 2012   10/29/21 1915  piperacillin-tazobactam (ZOSYN) IVPB 3.375 g        3.375 g 100 mL/hr over 30 Minutes Intravenous  Once 10/29/21 1912 10/29/21 2012   10/29/21 1900  vancomycin (VANCOCIN) IVPB 1000 mg/200 mL premix        1,000 mg 200 mL/hr over 60 Minutes Intravenous  Once 10/29/21 1859 10/29/21 2035        I have personally reviewed the following labs and images: CBC: Recent Labs  Lab 10/29/21 1709 10/30/21 0226 10/31/21 0748 10/31/21 1513 11/01/21 0630 11/02/21 0558  WBC 13.2* 11.5* 11.7*  --  8.4 8.5  NEUTROABS 12.0* 9.4*  --   --   --   --   HGB 8.4* 8.0* 7.7* 9.9* 7.8* 8.0*  HCT 28.3* 26.3* 25.5* 29.0* 25.2* 25.4*  MCV 93.1 91.0 90.7  --  90.0 87.0  PLT 341 326 331  --  337 356   BMP &GFR Recent Labs  Lab 10/29/21 1709 10/30/21 0226 10/31/21 0748 10/31/21 1513 11/01/21 0630 11/02/21 0558  NA 137 139 137 136 137 137  K 4.1 4.2 4.5 4.5 4.4 4.5  CL 97* 99 100 96* 99 95*  CO2 '28 27 24  '$ --  27 24  GLUCOSE 343* 271* 172* 130* 136* 172*  BUN 20 29* 46* 18 25* 45*  CREATININE 3.38* 4.11*  5.41* 2.70* 4.29* 5.77*  CALCIUM 8.6* 8.7* 8.9  --  8.6* 8.7*  MG  --  2.2  --   --  2.1 2.2  PHOS  --   --  6.0*  --  5.9* 7.3*   Estimated Creatinine Clearance: 5.7 mL/min (A) (by C-G formula based on SCr of 5.77 mg/dL (H)). Liver & Pancreas: Recent Labs  Lab 10/29/21 1709 10/30/21 0226 10/31/21 0748 11/01/21 0630 11/02/21 0558  AST 26 15  --   --   --   ALT 19 16  --   --   --   ALKPHOS 67 62  --   --   --   BILITOT 0.6 0.7  --   --   --   PROT 6.6 6.3*  --   --   --   ALBUMIN 2.3* 2.2* 2.0* 2.1* 2.2*   No results for input(s): "LIPASE", "AMYLASE" in the last 168 hours. No results for input(s): "AMMONIA" in the last 168 hours. Diabetic: Recent Labs    11/02/21 0558  HGBA1C 6.1*   Recent Labs  Lab 11/02/21 2027 11/03/21 0126 11/03/21 0503 11/03/21 0738 11/03/21 1121  GLUCAP 203* 102* 208* 230* 193*   Cardiac Enzymes: No results for input(s): "CKTOTAL", "CKMB", "CKMBINDEX", "TROPONINI" in the last 168 hours. No results for input(s): "PROBNP" in the last 8760 hours. Coagulation Profile: Recent Labs  Lab 10/29/21 1709 11/01/21 0630  INR 1.4* 1.2   Thyroid Function Tests: No results for input(s): "TSH", "T4TOTAL", "FREET4", "T3FREE", "THYROIDAB" in the last 72 hours. Lipid Profile: No results for input(s): "CHOL", "HDL", "LDLCALC", "TRIG", "CHOLHDL", "LDLDIRECT" in the last 72 hours. Anemia Panel: No results for input(s): "VITAMINB12", "FOLATE", "FERRITIN", "TIBC", "IRON", "RETICCTPCT" in the last 72 hours. Urine analysis:    Component Value Date/Time   COLORURINE YELLOW 10/29/2021 1530   APPEARANCEUR HAZY (A) 10/29/2021 1530   LABSPEC 1.015 10/29/2021 1530   PHURINE 7.0 10/29/2021 1530   GLUCOSEU NEGATIVE 10/29/2021 1530   HGBUR SMALL (A) 10/29/2021 1530   BILIRUBINUR NEGATIVE 10/29/2021 1530   KETONESUR NEGATIVE 10/29/2021 1530   PROTEINUR >=300 (A) 10/29/2021 1530   UROBILINOGEN 1.0 06/18/2013 1633   NITRITE NEGATIVE 10/29/2021 1530   LEUKOCYTESUR  NEGATIVE 10/29/2021 1530   Sepsis Labs: Invalid input(s): "PROCALCITONIN", "LACTICIDVEN"  Microbiology: Recent Results (from the past 240 hour(s))  Culture, blood (Routine x 2)     Status: None   Collection Time: 10/29/21  5:10 PM   Specimen: BLOOD  Result Value Ref Range Status   Specimen Description BLOOD SITE NOT SPECIFIED  Final   Special Requests   Final    BOTTLES DRAWN AEROBIC AND ANAEROBIC Blood Culture adequate volume   Culture   Final    NO GROWTH 5 DAYS Performed at [facility], [address], [place],  [address]    Report Status 11/03/2021 FINAL  Final  SARS Coronavirus 2 by RT PCR (hospital order, performed in [facility] hospital lab) *cepheid single result test* Anterior Nasal Swab     Status: None   Collection Time: 10/29/21  7:00 PM   Specimen: Anterior Nasal Swab  Result Value Ref Range Status   SARS Coronavirus 2 by RT PCR NEGATIVE NEGATIVE Final    Comment: (NOTE) SARS-CoV-2 target nucleic acids are NOT DETECTED.  The SARS-CoV-2 RNA is generally detectable in upper and lower respiratory specimens during the acute phase of infection. The lowest concentration of SARS-CoV-2 viral copies this assay can detect is 250 copies / mL. A negative result does not preclude SARS-CoV-2 infection and should not be used as the sole basis for treatment or other patient management decisions.  A negative result may occur with improper specimen collection / handling, submission of specimen other than nasopharyngeal swab, presence of viral mutation(s) within the areas targeted by this assay, and inadequate number of viral copies (<250 copies / mL). A negative result must be combined with clinical observations, patient history, and epidemiological information.  Fact Sheet for Patients:   https://www.patel.info/  Fact Sheet for Healthcare Providers: https://hall.com/  This test is not yet approved or  cleared by the  [place] FDA and has been authorized for detection and/or diagnosis of SARS-CoV-2 by FDA under an Emergency Use Authorization (EUA).  This EUA will remain in effect (meaning this test can be used) for the duration of the COVID-19 declaration under Section 564(b)(1) of the Act, 21 U.S.C. section 360bbb-3(b)(1), unless the authorization is terminated or revoked sooner.  Performed at [facility], [place] [address], [place], [place] [address]   Culture, blood (Routine x 2)     Status: Abnormal   Collection Time: 10/29/21  7:16 PM   Specimen: BLOOD  Result Value Ref Range Status   Specimen Description BLOOD RIGHT ANTECUBITAL  Final   Special Requests   Final    BOTTLES DRAWN AEROBIC AND ANAEROBIC Blood Culture results may not be optimal due to an excessive volume of blood received in culture bottles   Culture  Setup Time   Final    GRAM POSITIVE COCCI IN BOTH AEROBIC AND ANAEROBIC BOTTLES CRITICAL RESULT CALLED TO, READ BACK BY AND VERIFIED WITH: PHARMD C.JACKSON AT 1430 ON 10/30/2021 BY T.SAAD.    Culture (A)  Final    STREPTOCOCCUS GALLOLYTICUS THE SIGNIFICANCE OF ISOLATING THIS ORGANISM FROM A SINGLE SET OF BLOOD CULTURES WHEN MULTIPLE SETS ARE DRAWN IS UNCERTAIN. PLEASE NOTIFY THE MICROBIOLOGY DEPARTMENT WITHIN ONE WEEK IF SPECIATION AND SENSITIVITIES ARE REQUIRED. Performed at [facility], [place] [address], [place], [place] [address]    Report Status 11/01/2021 FINAL  Final  Blood Culture ID Panel (Reflexed)     Status: Abnormal   Collection Time: 10/29/21  7:16 PM  Result Value Ref Range Status   Enterococcus faecalis NOT DETECTED NOT DETECTED Final   Enterococcus Faecium NOT DETECTED NOT DETECTED Final   Listeria monocytogenes NOT DETECTED NOT DETECTED Final   Staphylococcus species NOT DETECTED NOT DETECTED Final   Staphylococcus aureus (BCID) NOT DETECTED NOT DETECTED Final   Staphylococcus epidermidis NOT DETECTED NOT DETECTED Final   Staphylococcus  lugdunensis NOT DETECTED NOT DETECTED Final   Streptococcus species DETECTED (A) NOT DETECTED Final    Comment: Not Enterococcus species, Streptococcus agalactiae, Streptococcus pyogenes, or Streptococcus pneumoniae. CRITICAL RESULT CALLED TO, READ BACK BY AND VERIFIED WITH: PHARMD C.JACKSON AT 1430 ON 10/30/2021 BY T.SAAD.    Streptococcus agalactiae NOT DETECTED NOT DETECTED Final   Streptococcus pneumoniae NOT DETECTED NOT DETECTED Final   Streptococcus pyogenes NOT DETECTED NOT DETECTED Final   A.calcoaceticus-baumannii NOT DETECTED NOT DETECTED Final   Bacteroides fragilis NOT DETECTED NOT DETECTED Final   Enterobacterales NOT DETECTED NOT DETECTED Final   Enterobacter cloacae complex NOT DETECTED NOT DETECTED Final   Escherichia coli NOT DETECTED NOT DETECTED Final   Klebsiella aerogenes NOT DETECTED NOT DETECTED Final   Klebsiella oxytoca NOT DETECTED NOT DETECTED Final   Klebsiella pneumoniae NOT DETECTED NOT DETECTED Final   Proteus species NOT DETECTED NOT DETECTED Final   Salmonella species NOT DETECTED NOT DETECTED Final   Serratia marcescens NOT DETECTED NOT DETECTED Final   Haemophilus influenzae NOT DETECTED NOT DETECTED  Final   Neisseria meningitidis NOT DETECTED NOT DETECTED Final   Pseudomonas aeruginosa NOT DETECTED NOT DETECTED Final   Stenotrophomonas maltophilia NOT DETECTED NOT DETECTED Final   Candida albicans NOT DETECTED NOT DETECTED Final   Candida auris NOT DETECTED NOT DETECTED Final   Candida glabrata NOT DETECTED NOT DETECTED Final   Candida krusei NOT DETECTED NOT DETECTED Final   Candida parapsilosis NOT DETECTED NOT DETECTED Final   Candida tropicalis NOT DETECTED NOT DETECTED Final   Cryptococcus neoformans/gattii NOT DETECTED NOT DETECTED Final    Comment: Performed at [facility], [place] [address] [place], [place] [address]  Surgical PCR screen     Status: Abnormal   Collection Time: 10/31/21  2:42 PM   Specimen: Nasal Mucosa; Nasal Swab   Result Value Ref Range Status   MRSA, PCR NEGATIVE NEGATIVE Final   Staphylococcus aureus POSITIVE (A) NEGATIVE Final    Comment: (NOTE) The Xpert SA Assay (FDA approved for NASAL specimens in patients 84 years of age and older), is one component of a comprehensive surveillance program. It is not intended to diagnose infection nor to guide or monitor treatment. Performed at [facility], [place] [address] [place], [place] [address]   Aerobic/Anaerobic Culture w Gram Stain (surgical/deep wound)     Status: None (Preliminary result)   Collection Time: 11/02/21 10:10 AM   Specimen: Abdomen; Abscess  Result Value Ref Range Status   Specimen Description ABDOMEN ABSCESS  Final   Special Requests  PERINEPHRIC FLUID  Final   Gram Stain   Final    ABUNDANT WBC PRESENT,BOTH PMN AND MONONUCLEAR NO ORGANISMS SEEN    Culture   Final    NO GROWTH 1 DAY Performed at [facility], [address], [place], [place] [address]    Report Status PENDING  Incomplete    Radiology Studies: No results found.    Akyah Lagrange T. Ogden  If 7PM-7AM, please contact night-coverage [email or web address] 11/03/2021, 1:16 PM

## 2021-11-03 NOTE — Procedures (Signed)
Received patient in bed to unit.  Alert and oriented.  Informed consent signed and in chart.   Treatment initiated: 2054 Treatment completed: 0030  Patient tolerated well.  Transported back to the room  Alert, without acute distress.  Hand-off given to patient's nurse.   Access used: fistula Access issues: none  Total UF removed: 1000 Medication(s) given: none Post HD VS: 98.2  143/73 72 20  100% Post HD weight: 57.8   [facility]

## 2021-11-03 NOTE — Progress Notes (Signed)
[facility] Progress Note   Subjective:  Seen in room. Completed dialysis early this am and got dizzy afterwards. Feels a little better now, eating breakfast. IR drain with purulent fluid --Culture pending.   Objective Vitals:   11/03/21 0113 11/03/21 0128 11/03/21 0458 11/03/21 0902  BP: (!) 143/73 (!) 146/69 (!) 154/63 (!) 146/87  Pulse: 72 71 68 66  Resp: '20 16 18 18  '$ Temp:  97.6 F (36.4 C) 97.7 F (36.5 C) 97.8 F (36.6 C)  TempSrc:  Oral Oral Oral  SpO2: 100% 98% 97% 100%  Weight:      Height:       Physical Exam General: Well appearing woman, NAD. Room air. Heart: RRR; 3/6 murmur Lungs: CTAB  Abdomen: soft, non-tender Extremities: No LE edema Dialysis Access:  LUE AVF + bruit  Additional Objective Labs: Basic Metabolic Panel: Recent Labs  Lab 10/31/21 0748 10/31/21 1513 11/01/21 0630 11/02/21 0558  NA 137 136 137 137  K 4.5 4.5 4.4 4.5  CL 100 96* 99 95*  CO2 24  --  27 24  GLUCOSE 172* 130* 136* 172*  BUN 46* 18 25* 45*  CREATININE 5.41* 2.70* 4.29* 5.77*  CALCIUM 8.9  --  8.6* 8.7*  PHOS 6.0*  --  5.9* 7.3*    Liver Function Tests: Recent Labs  Lab 10/29/21 1709 10/30/21 0226 10/31/21 0748 11/01/21 0630 11/02/21 0558  AST 26 15  --   --   --   ALT 19 16  --   --   --   ALKPHOS 67 62  --   --   --   BILITOT 0.6 0.7  --   --   --   PROT 6.6 6.3*  --   --   --   ALBUMIN 2.3* 2.2* 2.0* 2.1* 2.2*    CBC: Recent Labs  Lab 10/29/21 1709 10/30/21 0226 10/31/21 0748 10/31/21 1513 11/01/21 0630 11/02/21 0558  WBC 13.2* 11.5* 11.7*  --  8.4 8.5  NEUTROABS 12.0* 9.4*  --   --   --   --   HGB 8.4* 8.0* 7.7* 9.9* 7.8* 8.0*  HCT 28.3* 26.3* 25.5* 29.0* 25.2* 25.4*  MCV 93.1 91.0 90.7  --  90.0 87.0  PLT 341 326 331  --  337 356    Blood Culture    Component Value Date/Time   SDES ABDOMEN ABSCESS 11/02/2021 1010   SPECREQUEST  PERINEPHRIC FLUID 11/02/2021 1010   CULT PENDING 11/02/2021 1010   REPTSTATUS PENDING  11/02/2021 1010   Studies/Results: CT GUIDED PERITONEAL/RETROPERITONEAL FLUID DRAIN BY PERC CATH  Result Date: 11/02/2021 INDICATION: 86 year old female with perinephric fluid collection, presents for drainage. EXAM: CT-GUIDED DRAINAGE OF LEFT PERINEPHRIC FLUID TECHNIQUE: Multidetector CT imaging of the abdomen was performed following the standard protocol without IV contrast. RADIATION DOSE REDUCTION: This exam was performed according to the departmental dose-optimization program which includes automated exposure control, adjustment of the mA and/or kV according to patient size and/or use of iterative reconstruction technique. MEDICATIONS: None ANESTHESIA/SEDATION: Moderate (conscious) sedation was employed during this procedure. A total of Versed 1.0 mg and Fentanyl 50 mcg was administered intravenously by the radiology nurse. Total intra-service moderate Sedation Time: 14 minutes. The patient's level of consciousness and vital signs were monitored continuously by radiology nursing throughout the procedure under my direct supervision. COMPLICATIONS: None PROCEDURE: Informed written consent was obtained from the patient after a thorough discussion of the procedural risks, benefits and alternatives. All questions were addressed. Maximal Sterile  Barrier Technique was utilized including caps, mask, sterile gowns, sterile gloves, sterile drape, hand hygiene and skin antiseptic. A timeout was performed prior to the initiation of the procedure. Patient position prone on the CT gantry table. Scout CT acquired for planning purposes. Using CT guidance, trocar needle was advanced into the fluid collection in the left perinephric space. Modified Seldinger technique was used to place a 10 [place] drain. Proximally 30 cc of complex/purulent sanguinous fluid aspirated. Culture was sent to the lab. Drain was attached to bulb suction and sutured in position. Final images were stored. Patient tolerated the procedure well and  remained hemodynamically stable throughout. No complications were encountered and no significant blood loss. IMPRESSION: Status post CT-guided drainage of left perinephric fluid. Signed, Dulcy Fanny. Nadene Rubins, RPVI [facility]iology Electronically Signed   By: Corrie Mckusick D.O.   On: 11/02/2021 12:47    Medications:   ceFAZolin (ANCEF) IV 2 g (11/03/21 0200)    (feeding supplement) PROSource Plus  30 mL Oral BID BM   amiodarone  200 mg Oral Daily   calcitRIOL  1.25 mcg Oral Q M,W,F-1800   carvedilol  3.125 mg Oral BID   colestipol  1 g Oral Q M,W,F   darbepoetin (ARANESP) injection - DIALYSIS  150 mcg Intravenous Q Wed-HD   insulin aspart  0-6 Units Subcutaneous TID AC & HS   insulin glargine-yfgn  4 Units Subcutaneous Daily   Lifitegrast  1 drop Both Eyes QHS   lipase/protease/amylase  36,000 Units Oral With snacks   lipase/protease/amylase  72,000 Units Oral TID WC   melatonin  3 mg Oral QHS   midodrine  10 mg Oral Q M,W,F-HD   mupirocin ointment  1 Application Nasal BID   sevelamer carbonate  1,600 mg Oral TID WC   sodium chloride flush  3 mL Intravenous Q12H   sodium chloride flush  5 mL Intracatheter Q8H    Dialysis Orders: MWF at [facility] -> full HD 9/11, post-wt 57.3kg 3:45hr, 350/A1.5, EDW 57.5kg, 2K/2Ca bath, LUE AVF, 16g needles, no heparin - Mircera 80mg IV q 2 weeks (last given 9/1) - last Hgb 8.1 on 9/6 - Venofer '50mg'$  IV weekly - Calcitriol 1.230m PO q HD - HBsAg negative on 09/12/21   Assessment/Plan:  L hydronephrosis/urine leak from thermal injury: Recent coiling embolization of L renal tumor on 08/22/21. 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  Urology performed surgery 9/13 - ureter was blocked, stent placed. + hematuria. Per urology and primary team. L perinephric abscess - s/p IR  drain. Culture pending. Remains on Ancef  ESRD:  Continue HD on usual MWF schedule - next 9/18   BP/volume: Gets midodrine pre-HD, also on BB for A-fib. No edema. Minimal UF. Follow.  Anemia of ESRD: Hgb low - 8, was same range last week as outpatient. Aranesp 15021mgiven 9/13. Transfuse if drops < 7. Hgb 8.0 today -stable   Metabolic bone disease: CorrCa ok, Phos a little high. Continue home binders + VDRA.  Nutrition:  Alb low, continue supplements while here.  A-fib: Dx in 08/2021, on amiodarone, BB, Eliquis BID. Seems to be in NSR this admit.  Insomnia: Chronic issue, asking for something to help tonight, will message hospitalist and order melatonin for her.  OgeLynnda Child-C [facility] 11/03/2021,9:37 AM

## 2021-11-04 DIAGNOSIS — C642 Malignant neoplasm of left kidney, except renal pelvis: Secondary | ICD-10-CM | POA: Diagnosis not present

## 2021-11-04 DIAGNOSIS — N151 Renal and perinephric abscess: Secondary | ICD-10-CM

## 2021-11-04 DIAGNOSIS — A419 Sepsis, unspecified organism: Secondary | ICD-10-CM | POA: Diagnosis not present

## 2021-11-04 DIAGNOSIS — R509 Fever, unspecified: Secondary | ICD-10-CM

## 2021-11-04 DIAGNOSIS — R7881 Bacteremia: Secondary | ICD-10-CM

## 2021-11-04 DIAGNOSIS — E872 Acidosis, unspecified: Secondary | ICD-10-CM | POA: Diagnosis not present

## 2021-11-04 DIAGNOSIS — R6520 Severe sepsis without septic shock: Secondary | ICD-10-CM | POA: Diagnosis not present

## 2021-11-04 DIAGNOSIS — N1330 Unspecified hydronephrosis: Secondary | ICD-10-CM | POA: Diagnosis not present

## 2021-11-04 DIAGNOSIS — B955 Unspecified streptococcus as the cause of diseases classified elsewhere: Secondary | ICD-10-CM

## 2021-11-04 LAB — GLUCOSE, CAPILLARY
Glucose-Capillary: 162 mg/dL — ABNORMAL HIGH (ref 70–99)
Glucose-Capillary: 200 mg/dL — ABNORMAL HIGH (ref 70–99)

## 2021-11-04 MED ORDER — POLYETHYLENE GLYCOL 3350 17 GM/SCOOP PO POWD: 17.0000 g | Freq: Two times a day (BID) | ORAL | 0 refills | Status: DC | PRN

## 2021-11-04 MED ORDER — SODIUM CHLORIDE 0.9% FLUSH: 5.0000 mL | Freq: Three times a day (TID) | INTRAVENOUS | Status: DC

## 2021-11-04 MED ORDER — CEFAZOLIN SODIUM-DEXTROSE 2-4 GM/100ML-% IV SOLN: 2.0000 g | INTRAVENOUS | Status: AC

## 2021-11-04 MED ORDER — SENNOSIDES-DOCUSATE SODIUM 8.6-50 MG PO TABS: 1.0000 | ORAL_TABLET | Freq: Two times a day (BID) | ORAL | 0 refills | Status: DC | PRN

## 2021-11-04 MED ORDER — CHLORHEXIDINE GLUCONATE CLOTH 2 % EX PADS: 6.0000 | MEDICATED_PAD | Freq: Every day | CUTANEOUS | Status: DC

## 2021-11-04 MED ORDER — CARVEDILOL 3.125 MG PO TABS: 3.1250 mg | ORAL_TABLET | Freq: Two times a day (BID) | ORAL | 1 refills | Status: DC

## 2021-11-04 NOTE — Progress Notes (Signed)
[facility] Progress Note   Subjective:  Seen in room.  No new events. Getting restless, wants to go home. Cultures remain negative.  Objective Vitals:   11/03/21 1714 11/03/21 2137 11/04/21 0518 11/04/21 0916  BP: (!) 153/70 (!) 168/81 (!) 147/79 (!) 142/69  Pulse: 65 70 71 65  Resp: '18 17 17 18  '$ Temp: 98 F (36.7 C) 98.1 F (36.7 C) 98.2 F (36.8 C) 97.8 F (36.6 C)  TempSrc: Oral   Oral  SpO2: 100% 99% 96% 96%  Weight:      Height:       Physical Exam General: Well appearing woman, NAD. Room air. Heart: RRR; 3/6 murmur Lungs: CTAB  Abdomen: soft, non-tender Extremities: No LE edema Dialysis Access:  LUE AVF + bruit  Additional Objective Labs: Basic Metabolic Panel: Recent Labs  Lab 10/31/21 0748 10/31/21 1513 11/01/21 0630 11/02/21 0558  NA 137 136 137 137  K 4.5 4.5 4.4 4.5  CL 100 96* 99 95*  CO2 24  --  27 24  GLUCOSE 172* 130* 136* 172*  BUN 46* 18 25* 45*  CREATININE 5.41* 2.70* 4.29* 5.77*  CALCIUM 8.9  --  8.6* 8.7*  PHOS 6.0*  --  5.9* 7.3*    Liver Function Tests: Recent Labs  Lab 10/29/21 1709 10/30/21 0226 10/31/21 0748 11/01/21 0630 11/02/21 0558  AST 26 15  --   --   --   ALT 19 16  --   --   --   ALKPHOS 67 62  --   --   --   BILITOT 0.6 0.7  --   --   --   PROT 6.6 6.3*  --   --   --   ALBUMIN 2.3* 2.2* 2.0* 2.1* 2.2*    CBC: Recent Labs  Lab 10/29/21 1709 10/30/21 0226 10/31/21 0748 10/31/21 1513 11/01/21 0630 11/02/21 0558  WBC 13.2* 11.5* 11.7*  --  8.4 8.5  NEUTROABS 12.0* 9.4*  --   --   --   --   HGB 8.4* 8.0* 7.7* 9.9* 7.8* 8.0*  HCT 28.3* 26.3* 25.5* 29.0* 25.2* 25.4*  MCV 93.1 91.0 90.7  --  90.0 87.0  PLT 341 326 331  --  337 356    Blood Culture    Component Value Date/Time   SDES ABDOMEN ABSCESS 11/02/2021 1010   SPECREQUEST  PERINEPHRIC FLUID 11/02/2021 1010   CULT  11/02/2021 1010    NO GROWTH 1 DAY Performed at [facility], [place] [address] [place], [place] [address]     REPTSTATUS PENDING 11/02/2021 1010   Studies/Results: CT GUIDED PERITONEAL/RETROPERITONEAL FLUID DRAIN BY PERC CATH  Result Date: 11/02/2021 INDICATION: 86 year old female with perinephric fluid collection, presents for drainage. EXAM: CT-GUIDED DRAINAGE OF LEFT PERINEPHRIC FLUID TECHNIQUE: Multidetector CT imaging of the abdomen was performed following the standard protocol without IV contrast. RADIATION DOSE REDUCTION: This exam was performed according to the departmental dose-optimization program which includes automated exposure control, adjustment of the mA and/or kV according to patient size and/or use of iterative reconstruction technique. MEDICATIONS: None ANESTHESIA/SEDATION: Moderate (conscious) sedation was employed during this procedure. A total of Versed 1.0 mg and Fentanyl 50 mcg was administered intravenously by the radiology nurse. Total intra-service moderate Sedation Time: 14 minutes. The patient's level of consciousness and vital signs were monitored continuously by radiology nursing throughout the procedure under my direct supervision. COMPLICATIONS: None PROCEDURE: Informed written consent was obtained from the patient after a thorough discussion  of the procedural risks, benefits and alternatives. All questions were addressed. Maximal Sterile Barrier Technique was utilized including caps, mask, sterile gowns, sterile gloves, sterile drape, hand hygiene and skin antiseptic. A timeout was performed prior to the initiation of the procedure. Patient position prone on the CT gantry table. Scout CT acquired for planning purposes. Using CT guidance, trocar needle was advanced into the fluid collection in the left perinephric space. Modified Seldinger technique was used to place a 10 [place] drain. Proximally 30 cc of complex/purulent sanguinous fluid aspirated. Culture was sent to the lab. Drain was attached to bulb suction and sutured in position. Final images were stored. Patient tolerated the  procedure well and remained hemodynamically stable throughout. No complications were encountered and no significant blood loss. IMPRESSION: Status post CT-guided drainage of left perinephric fluid. Signed, Dulcy Fanny. Nadene Rubins, RPVI [facility] Radiology Electronically Signed   By: Corrie Mckusick D.O.   On: 11/02/2021 12:47    Medications:   ceFAZolin (ANCEF) IV 2 g (11/03/21 0200)    (feeding supplement) PROSource Plus  30 mL Oral BID BM   amiodarone  200 mg Oral Daily   apixaban  2.5 mg Oral BID   calcitRIOL  1.25 mcg Oral Q M,W,F-1800   carvedilol  3.125 mg Oral BID   colestipol  1 g Oral Q M,W,F   darbepoetin (ARANESP) injection - DIALYSIS  150 mcg Intravenous Q Wed-HD   insulin aspart  0-6 Units Subcutaneous TID AC & HS   insulin glargine-yfgn  4 Units Subcutaneous Daily   Lifitegrast  1 drop Both Eyes QHS   lipase/protease/amylase  36,000 Units Oral With snacks   lipase/protease/amylase  72,000 Units Oral TID WC   melatonin  3 mg Oral QHS   midodrine  10 mg Oral Q M,W,F-HD   mupirocin ointment  1 Application Nasal BID   sevelamer carbonate  1,600 mg Oral TID WC   sodium chloride flush  3 mL Intravenous Q12H   sodium chloride flush  5 mL Intracatheter Q8H    Dialysis Orders: MWF at [facility] -> full HD 9/11, post-wt 57.3kg 3:45hr, 350/A1.5, EDW 57.5kg, 2K/2Ca bath, LUE AVF, 16g needles, no heparin - Mircera 19mg IV q 2 weeks (last given 9/1) - last Hgb 8.1 on 9/6 - Venofer '50mg'$  IV weekly - Calcitriol 1.235m PO q HD - HBsAg negative on 09/12/21   Assessment/Plan:  L hydronephrosis/urine leak from thermal injury: Recent coiling embolization of L renal tumor on 08/22/21. 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  Urology performed surgery 9/13 - ureter was blocked, stent placed. + hematuria. Per urology and primary team. L perinephric abscess - s/p IR  drain. Culture neg to date. Remains on Ancef  ESRD:   Continue HD on usual MWF schedule - next 9/18   BP/volume: Gets midodrine pre-HD, also on BB for A-fib. No edema. Minimal UF. Some orthostatic symptoms here.  Follow.  Anemia of ESRD: Hgb low - 8, was same range last week as outpatient. Aranesp 15030mgiven 9/13. Transfuse if drops < 7. Hgb 8.0 today -stable   Metabolic bone disease: CorrCa ok, Phos a little high. Continue home binders + VDRA.  Nutrition:  Alb low, continue supplements while here.  A-fib: Dx in 08/2021, on amiodarone, BB, Eliquis BID. Seems to be in NSR this admit.  Insomnia: Chronic issue, asking for something to help tonight, will message hospitalist and order melatonin for her.  Lynnda Child PA-C [facility] 11/04/2021,9:27 AM

## 2021-11-04 NOTE — Discharge Summary (Signed)
Physician Discharge Summary  Kim Burns [id number]:[id number] DOB: September 25, 1935 DOA: 10/29/2021  PCP: [name], MD  Admit date: 10/29/2021 Discharge date: 11/04/2021 Admitted From: Home Disposition: Home Recommendations for Outpatient Follow-up:  Follow up with PCP in 1 to 2 weeks Interventional radiology to arrange outpatient follow-up for drain management Consider referral to gastroenterology for colonoscopy Outpatient follow-up with urology in 1 to 2 weeks Please follow up on the following pending results: None  Home Health: PT Equipment/Devices: None  Discharge Condition: Stable CODE STATUS: Full code  Follow-up Information     Ardis Hughs, MD. Call in 6 week(s).   Specialty: Urology Contact information: [place] [address] [phone]         [facility] Follow up.   Specialty: Home Health Services Why: Someone will call you to resume care. Contact information: [address] [place] [address] [phone]         [place]. Schedule an appointment as soon as possible for a visit in 1 week(s).   Specialty: Radiology Contact information: [address] [phone] [place] [facility] course 86 year old F with PMH of ESRD on HD MWF, paroxysmal A-fib on Eliquis, diastolic CHF, nonobstructive CAD, prior CVA, left RCC, prior Whipple procedure with resultant IDDM and HTN presenting with several weeks of low-grade "fever: "And generalized weakness and admitted with SIRS.  Patient had ablation of left renal mass in 08/2021.  CT abdomen and pelvis in ED showed interval development of post ablation changes to left kidney with additional interval development of severe hydronephrosis with obstruction of the proximal ureter.  She also had lactic acidosis and leukocytosis.  Cultures obtained.  She was  started on vancomycin and Zosyn.  Nephrology and urology consulted before admission to the hospital.   Blood culture with Streptococcus gallolyticus in 1 out of 2 bottles.  Patient had left ureteral stent placement by urology on 9/13, and drain placed for left perinephric abscess by IR on 9/15.  ID consulted and recommends IV Ancef with HD for 2 weeks, and outpatient follow-up with GI for colonoscopy given association between Streptococcus gallolyticus and colon cancer.   On the day of discharge, patient felt well and ready to go home.  IR to arrange outpatient follow-up for drain management.  Patient will continue flushing the drain with 5 cc NS every 8 hours.  She will also follow-up with urology for further management of ureteral stent.  She will continue IV Ancef with HD MWF for a total of 2 weeks.  See individual problem list below for more.   Problems addressed during this hospitalization Principal Problem:   Severe sepsis ([place]) Active Problems:   Hydronephrosis of left kidney   Lactic acidosis   Renal cell carcinoma of left kidney (HCC)   ESRD (end stage renal disease) on dialysis (HCC)   Paroxysmal atrial fibrillation (HCC)   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)   Chronic diastolic CHF (congestive heart failure) (HCC)   Fever   Bacteremia due to Streptococcus   Kidney, perinephric abscess   Severe sepsis due to Streptococcus gallolyticus bacteremia and left perinephric abscess: POA.  Had leukocytosis, tachypnea, lactic acidosis and hypotension.  CT showed left perinephric fluid collection and severe left hydronephrosis. Blood culture with Streptococcus gallolyticus in 1 out of 2 bottles.  Has left ureteral stent  and drain placed.  Abscess culture NGTD.  She is hemodynamically stable.  Lactic acidosis and hypotension resolved.   -Appreciate ID recs -Vanco on 9/11.  Zosyn 9/11-9/14>> Ancef for a total of 2 weeks with HD.  Nephrology to arrange -Follow-up with GI for  colonoscopy given association b/n  colon cancer and S.  Gallolyticus -Drain management per IR who will arrange outpatient follow-up.     ESRD on HD MWF -HD per nephrology.   Severe left hydronephrosis -S/p left ureteral stent on 9/13   Orthostasis?  Unusual after HD for her.  Orthostatic vitals negative.  Neuro exam without significant finding. -Nephrology suggested holding carvedilol on HD days -Continue home midodrine -Consider compression weights   Controlled IDDM-1 after Whipple procedure: A1c 6.1%. -Continue home NovoLog   Pancreatic insufficiency after Whipple procedure -Continue home home Creon   Left kidney renal cell carcinoma:  s/p Embolization of the renal mass 08/22/2021. Severe left hydronephrosis-S/p ureteral stent placement on 9/13 -Urology to arrange outpatient follow-up -Antibiotics as above   Paroxysmal A-fib: Rate controlled. -Continue home dose of amiodarone, Coreg and Eliquis.   Chronic diastolic CHF: TTE 08/3417 with LVEF of 60 to 65%, G3-DD, severe LAE and RAE and RVSP of 46.  Not on diuretics at home.  Could partly contribute to her generalized weakness.  -Fluid management by HD   Normocytic anemia: Stable and at baseline. -Recheck CBC at follow-up.  Family communication: Updated patient's son over the phone on the day of discharge           Vital signs Vitals:   11/03/21 1714 11/03/21 2137 11/04/21 0518 11/04/21 0916  BP: (!) 153/70 (!) 168/81 (!) 147/79 (!) 142/69  Pulse: 65 70 71 65  Temp: 98 F (36.7 C) 98.1 F (36.7 C) 98.2 F (36.8 C) 97.8 F (36.6 C)  Resp: '18 17 17 18  '$ Height:      Weight:      SpO2: 100% 99% 96% 96%  TempSrc: Oral   Oral  BMI (Calculated):         Discharge exam  GENERAL: No apparent distress.  Nontoxic. HEENT: MMM.  Vision and hearing grossly intact.  NECK: Supple.  No apparent JVD.  RESP:  No IWOB.  Fair aeration bilaterally. CVS:  RRR. Heart sounds normal.  ABD/GI/GU: BS+. Abd soft, NTND.  JP drain to  left kidney. MSK/EXT:  Moves extremities. No apparent deformity. No edema.  SKIN: no apparent skin lesion or wound NEURO: Awake and alert. Oriented appropriately.  No apparent focal neuro deficit. PSYCH: Calm. Normal affect.   Discharge Instructions Discharge Instructions     Diet - low sodium heart healthy   Complete by: As directed    Discharge instructions   Complete by: As directed    It has been a pleasure taking care of you!  You were hospitalized due to left kidney infection and bloodstream infection for which you have been treated surgically and medically.  We are discharging you on intravenous antibiotics to be administered after dialysis.  We recommend follow-up with your urologist and interventional radiology per their recommendation.  Please call the office if you do not hear from them in the next 1 week.  To reduce your risk of dizziness, we recommend not taking Coreg on dialysis days.  Take care,   Increase activity slowly   Complete by: As directed    No wound care   Complete by: As directed       Allergies as of 11/04/2021  Reactions   Lopid [gemfibrozil] Other (See Comments)   Myalgia    Lotemax [loteprednol Etabonate] Rash   Skin burning    Statins Other (See Comments)   Muscle weakness   Norco [hydrocodone-acetaminophen] Other (See Comments)   Headaches  Tolerates acetaminophen    Other Diarrhea   Real Butter - diarrhea   Tomato Other (See Comments)   Belching    Vibra-tab [doxycycline] Other (See Comments)   Unknown reaction   Codeine Other (See Comments)   headache   Hydrocodone-acetaminophen Other (See Comments)   Headache. Tolerates acetaminophen.        Medication List     STOP taking these medications    diphenhydramine-acetaminophen 25-500 MG Tabs tablet Commonly known as: TYLENOL PM       TAKE these medications    amiodarone 200 MG tablet Commonly known as: Pacerone Take 1 tablet (200 mg total) by mouth daily. Please  keep appointment for further refills   apixaban 2.5 MG Tabs tablet Commonly known as: Eliquis Take 1 tablet (2.5 mg total) by mouth 2 (two) times daily.   carvedilol 3.125 MG tablet Commonly known as: COREG Take 1 tablet (3.125 mg total) by mouth 2 (two) times daily with a meal. Take on the days you do not have dialysis What changed:  when to take this additional instructions   ceFAZolin 2-4 GM/100ML-% IVPB Commonly known as: ANCEF Inject 100 mLs (2 g total) into the vein every Monday, Wednesday, and Friday with hemodialysis for 9 days. Start taking on: November 05, 2021   colestipol 1 g tablet Commonly known as: COLESTID Take 1 g by mouth See admin instructions. 1 tablet (1g) prior to dialysis M-W-F + 1 tablet daily PRN diarrhea   diclofenac sodium 1 % Gel Commonly known as: VOLTAREN Apply 2 g topically 4 (four) times daily as needed (for hand pain).   lidocaine-prilocaine cream Commonly known as: EMLA Apply 1 Application topically every Monday, Wednesday, and Friday with hemodialysis.   lipase/protease/amylase 36000 UNITS Cpep capsule Commonly known as: CREON Take 1 capsule (36,000 Units total) by mouth with snacks. What changed:  how much to take when to take this additional instructions   midodrine 10 MG tablet Commonly known as: PROAMATINE Take 10 mg by mouth as directed.   NovoLOG FlexPen 100 UNIT/ML FlexPen Generic drug: insulin aspart Inject 10 Units into the skin 3 (three) times daily as needed (CBG > 200).   polyethylene glycol powder 17 GM/SCOOP powder Commonly known as: MiraLax Take 17 g by mouth 2 (two) times daily as needed for moderate constipation.   senna-docusate 8.6-50 MG tablet Commonly known as: Senokot-S Take 1 tablet by mouth 2 (two) times daily between meals as needed for mild constipation.   sevelamer carbonate 800 MG tablet Commonly known as: RENVELA Take 1,600 mg by mouth 3 (three) times daily with meals.   sodium chloride flush  0.9 % Soln Commonly known as: NS 5 mLs by Intracatheter route every 8 (eight) hours.   Tylenol 8 Hour Arthritis Pain 650 MG CR tablet Generic drug: acetaminophen Take 1,300 mg by mouth daily as needed for pain.   VITAMIN D-3 PO Take 1 capsule by mouth every Monday, Wednesday, and Friday with hemodialysis.   Xiidra 5 % Soln Generic drug: Lifitegrast Place 1 drop into both eyes at bedtime.        Consultations: Nephrology Urology Interventional radiology Infectious disease  Procedures/Studies: 9/13-left ureteral stent placement 9/15-image guided drain placement for left perinephric abscess  CT GUIDED PERITONEAL/RETROPERITONEAL FLUID DRAIN BY PERC CATH  Result Date: 11/02/2021 INDICATION: 86 year old female with perinephric fluid collection, presents for drainage. EXAM: CT-GUIDED DRAINAGE OF LEFT PERINEPHRIC FLUID TECHNIQUE: Multidetector CT imaging of the abdomen was performed following the standard protocol without IV contrast. RADIATION DOSE REDUCTION: This exam was performed according to the departmental dose-optimization program which includes automated exposure control, adjustment of the mA and/or kV according to patient size and/or use of iterative reconstruction technique. MEDICATIONS: None ANESTHESIA/SEDATION: Moderate (conscious) sedation was employed during this procedure. A total of Versed 1.0 mg and Fentanyl 50 mcg was administered intravenously by the radiology nurse. Total intra-service moderate Sedation Time: 14 minutes. The patient's level of consciousness and vital signs were monitored continuously by radiology nursing throughout the procedure under my direct supervision. COMPLICATIONS: None PROCEDURE: Informed written consent was obtained from the patient after a thorough discussion of the procedural risks, benefits and alternatives. All questions were addressed. Maximal Sterile Barrier Technique was utilized including caps, mask, sterile gowns, sterile gloves,  sterile drape, hand hygiene and skin antiseptic. A timeout was performed prior to the initiation of the procedure. Patient position prone on the CT gantry table. Scout CT acquired for planning purposes. Using CT guidance, trocar needle was advanced into the fluid collection in the left perinephric space. Modified Seldinger technique was used to place a 10 [place] drain. Proximally 30 cc of complex/purulent sanguinous fluid aspirated. Culture was sent to the lab. Drain was attached to bulb suction and sutured in position. Final images were stored. Patient tolerated the procedure well and remained hemodynamically stable throughout. No complications were encountered and no significant blood loss. IMPRESSION: Status post CT-guided drainage of left perinephric fluid. Signed, Dulcy Fanny. Nadene Rubins, RPVI [facility] Radiology Electronically Signed   By: Corrie Mckusick D.O.   On: 11/02/2021 12:47   DG C-Arm 1-60 Min  Result Date: 10/31/2021 CLINICAL DATA:  Cystoscopy with retrograde pyelogram/ureteral stent placement. EXAM: DG C-ARM 1-60 MIN CONTRAST:  Intraoperative study.  Information not given. FLUOROSCOPY: Fluoroscopy Time:  2 minutes 2nd Radiation Exposure Index (if provided by the fluoroscopic device): 13.48 mGy Number of Acquired Spot Images: 0 COMPARISON:  CT 10/29/2021 FINDINGS: Multiple intraoperative spot images demonstrate decompressed right ureter. Severe right hydronephrosis. Final images demonstrate placement of left ureteral stent. IMPRESSION: Intraoperative imaging as above. Electronically Signed   By: Rolm Baptise M.D.   On: 10/31/2021 17:32   CT Renal Stone Study  Result Date: 10/29/2021 CLINICAL DATA:  Previous renal mass with fever EXAM: CT ABDOMEN AND PELVIS WITHOUT CONTRAST TECHNIQUE: Multidetector CT imaging of the abdomen and pelvis was performed following the standard protocol without IV contrast. RADIATION DOSE REDUCTION: This exam was  performed according to the departmental dose-optimization program which includes automated exposure control, adjustment of the mA and/or kV according to patient size and/or use of iterative reconstruction technique. COMPARISON:  CT 09/06/2021, 08/25/2021, 08/22/2021, 05/03/2021 FINDINGS: Lower chest: Lung bases demonstrate no consolidation or effusion. Minimal mosaic attenuation which could be due to small airways disease. Coronary vascular calcification. Trace air in the anterior right atrium is presumably from line placement. Hepatobiliary: No focal liver abnormality is seen. Status post cholecystectomy. No biliary dilatation. Pancreas: Status post previous Whipple procedure. Atrophy of the distal pancreas without inflammation. Spleen: Normal in size without focal abnormality. Adrenals/Urinary Tract: Adrenal glands are within normal limits. Suspected cortical thinning within the bilateral kidneys. Numerous bilateral cysts and hyperdense renal cortical lesions, further assessment limited without contrast. Suspect  that there is moderate severe left hydronephrosis. Embolization material in the left kidney slightly different orientation compared to prior, likely due to mass effect from large process in the lower pole. The patient's left renal mass measures approximately 3.1 cm, coronal series 6, image 46. Rounded area of heterogeneous density surrounding the lower pole renal lesion, there is large fluid component with remainder of mostly fat density, the area measures approximately 7.9 x 7.6 cm on coronal series 6 image 45; this measures 10.3 cm AP on series 3, image 40. It appears more organized compared to prior CT. The urinary bladder is unremarkable. Stomach/Bowel: The stomach shows postsurgical changes and moderate debris. There is no dilated small bowel. No acute bowel wall thickening. Vascular/Lymphatic: Advanced aortic atherosclerosis. No aneurysm. Subcentimeter retroperitoneal lymph nodes. Reproductive:  Status post hysterectomy. No adnexal masses. Other: Negative for pelvic effusion or free air. Numerous phleboliths and pelvic calcifications. Small fat containing ventral hernias. Musculoskeletal: No acute osseous abnormality. IMPRESSION: 1. Patient is status post ablation of mass in the lower pole of the left kidney. Interim organizing heterogenous mass at the lower pole of the left kidney surrounding the renal mass, this demonstrates mostly fat and fluid density and is presumably related to evolving ablation changes. There is no evidence for acute hematoma or active extravasation. There is however interim development of suspected moderate severe hydronephrosis of the left kidney with probable obstruction of the proximal ureter by the changes occurring in the lower pole of the left kidney. 2. History of prior Whipple procedure. No adverse features related to this finding allowing for limited exam due to absence of contrast. 3. Cardiomegaly Electronically Signed   By: Donavan Foil M.D.   On: 10/29/2021 20:31   DG Chest 2 View  Result Date: 10/29/2021 CLINICAL DATA:  Suspected sepsis EXAM: CHEST - 2 VIEW COMPARISON:  09/26/2021, 09/05/2021 FINDINGS: Low lung volumes. No pleural effusion or pneumothorax. No focal consolidation. Borderline cardiac size. Aortic atherosclerosis. Coils in the left upper quadrant. IMPRESSION: No active cardiopulmonary disease.  Hypoventilatory change. Electronically Signed   By: Donavan Foil M.D.   On: 10/29/2021 17:33       The results of significant diagnostics from this hospitalization (including imaging, microbiology, ancillary and laboratory) are listed below for reference.     Microbiology: Recent Results (from the past 240 hour(s))  Culture, blood (Routine x 2)     Status: None   Collection Time: 10/29/21  5:10 PM   Specimen: BLOOD  Result Value Ref Range Status   Specimen Description BLOOD SITE NOT SPECIFIED  Final   Special Requests   Final    BOTTLES DRAWN  AEROBIC AND ANAEROBIC Blood Culture adequate volume   Culture   Final    NO GROWTH 5 DAYS Performed at [facility], [address] [place], [place] [address]    Report Status 11/03/2021 FINAL  Final  SARS Coronavirus 2 by RT PCR (hospital order, performed in [facility] hospital lab) *cepheid single result test* Anterior Nasal Swab     Status: None   Collection Time: 10/29/21  7:00 PM   Specimen: Anterior Nasal Swab  Result Value Ref Range Status   SARS Coronavirus 2 by RT PCR NEGATIVE NEGATIVE Final    Comment: (NOTE) SARS-CoV-2 target nucleic acids are NOT DETECTED.  The SARS-CoV-2 RNA is generally detectable in upper and lower respiratory specimens during the acute phase of infection. The lowest concentration of SARS-CoV-2 viral copies this assay can detect is 250 copies /  mL. A negative result does not preclude SARS-CoV-2 infection and should not be used as the sole basis for treatment or other patient management decisions.  A negative result may occur with improper specimen collection / handling, submission of specimen other than nasopharyngeal swab, presence of viral mutation(s) within the areas targeted by this assay, and inadequate number of viral copies (<250 copies / mL). A negative result must be combined with clinical observations, patient history, and epidemiological information.  Fact Sheet for Patients:   https://www.patel.info/  Fact Sheet for Healthcare Providers: https://hall.com/  This test is not yet approved or  cleared by the [place] FDA and has been authorized for detection and/or diagnosis of SARS-CoV-2 by FDA under an Emergency Use Authorization (EUA).  This EUA will remain in effect (meaning this test can be used) for the duration of the COVID-19 declaration under Section 564(b)(1) of the Act, 21 U.S.C. section 360bbb-3(b)(1), unless the authorization is terminated or revoked sooner.  Performed  at [facility], [place] [address] [place], [place] [address]   Culture, blood (Routine x 2)     Status: Abnormal   Collection Time: 10/29/21  7:16 PM   Specimen: BLOOD  Result Value Ref Range Status   Specimen Description BLOOD RIGHT ANTECUBITAL  Final   Special Requests   Final    BOTTLES DRAWN AEROBIC AND ANAEROBIC Blood Culture results may not be optimal due to an excessive volume of blood received in culture bottles   Culture  Setup Time   Final    GRAM POSITIVE COCCI IN BOTH AEROBIC AND ANAEROBIC BOTTLES CRITICAL RESULT CALLED TO, READ BACK BY AND VERIFIED WITH: PHARMD C.JACKSON AT 1430 ON 10/30/2021 BY T.SAAD.    Culture (A)  Final    STREPTOCOCCUS GALLOLYTICUS THE SIGNIFICANCE OF ISOLATING THIS ORGANISM FROM A SINGLE SET OF BLOOD CULTURES WHEN MULTIPLE SETS ARE DRAWN IS UNCERTAIN. PLEASE NOTIFY THE MICROBIOLOGY DEPARTMENT WITHIN ONE WEEK IF SPECIATION AND SENSITIVITIES ARE REQUIRED. Performed at [facility], [place] [address] [place], [place] [address]    Report Status 11/01/2021 FINAL  Final  Blood Culture ID Panel (Reflexed)     Status: Abnormal   Collection Time: 10/29/21  7:16 PM  Result Value Ref Range Status   Enterococcus faecalis NOT DETECTED NOT DETECTED Final   Enterococcus Faecium NOT DETECTED NOT DETECTED Final   Listeria monocytogenes NOT DETECTED NOT DETECTED Final   Staphylococcus species NOT DETECTED NOT DETECTED Final   Staphylococcus aureus (BCID) NOT DETECTED NOT DETECTED Final   Staphylococcus epidermidis NOT DETECTED NOT DETECTED Final   Staphylococcus lugdunensis NOT DETECTED NOT DETECTED Final   Streptococcus species DETECTED (A) NOT DETECTED Final    Comment: Not Enterococcus species, Streptococcus agalactiae, Streptococcus pyogenes, or Streptococcus pneumoniae. CRITICAL RESULT CALLED TO, READ BACK BY AND VERIFIED WITH: PHARMD C.JACKSON AT 1430 ON 10/30/2021 BY T.SAAD.    Streptococcus agalactiae NOT DETECTED NOT DETECTED Final    Streptococcus pneumoniae NOT DETECTED NOT DETECTED Final   Streptococcus pyogenes NOT DETECTED NOT DETECTED Final   A.calcoaceticus-baumannii NOT DETECTED NOT DETECTED Final   Bacteroides fragilis NOT DETECTED NOT DETECTED Final   Enterobacterales NOT DETECTED NOT DETECTED Final   Enterobacter cloacae complex NOT DETECTED NOT DETECTED Final   Escherichia coli NOT DETECTED NOT DETECTED Final   Klebsiella aerogenes NOT DETECTED NOT DETECTED Final   Klebsiella oxytoca NOT DETECTED NOT DETECTED Final   Klebsiella pneumoniae NOT DETECTED NOT DETECTED Final   Proteus species NOT DETECTED NOT DETECTED Final  Salmonella species NOT DETECTED NOT DETECTED Final   Serratia marcescens NOT DETECTED NOT DETECTED Final   Haemophilus influenzae NOT DETECTED NOT DETECTED Final   Neisseria meningitidis NOT DETECTED NOT DETECTED Final   Pseudomonas aeruginosa NOT DETECTED NOT DETECTED Final   Stenotrophomonas maltophilia NOT DETECTED NOT DETECTED Final   Candida albicans NOT DETECTED NOT DETECTED Final   Candida auris NOT DETECTED NOT DETECTED Final   Candida glabrata NOT DETECTED NOT DETECTED Final   Candida krusei NOT DETECTED NOT DETECTED Final   Candida parapsilosis NOT DETECTED NOT DETECTED Final   Candida tropicalis NOT DETECTED NOT DETECTED Final   Cryptococcus neoformans/gattii NOT DETECTED NOT DETECTED Final    Comment: Performed at [facility], [place] [address], [place], [place] [address]  Surgical PCR screen     Status: Abnormal   Collection Time: 10/31/21  2:42 PM   Specimen: Nasal Mucosa; Nasal Swab  Result Value Ref Range Status   MRSA, PCR NEGATIVE NEGATIVE Final   Staphylococcus aureus POSITIVE (A) NEGATIVE Final    Comment: (NOTE) The Xpert SA Assay (FDA approved for NASAL specimens in patients 61 years of age and older), is one component of a comprehensive surveillance program. It is not intended to diagnose infection nor to guide or monitor treatment. Performed at [facility], [place] [address] [place], [place] [address]   Aerobic/Anaerobic Culture w Gram Stain (surgical/deep wound)     Status: None (Preliminary result)   Collection Time: 11/02/21 10:10 AM   Specimen: Abdomen; Abscess  Result Value Ref Range Status   Specimen Description ABDOMEN ABSCESS  Final   Special Requests  PERINEPHRIC FLUID  Final   Gram Stain   Final    ABUNDANT WBC PRESENT,BOTH PMN AND MONONUCLEAR NO ORGANISMS SEEN    Culture   Final    NO GROWTH 1 DAY Performed at [facility], [address], [place], [place] [address]    Report Status PENDING  Incomplete     Labs:  CBC: Recent Labs  Lab 10/29/21 1709 10/30/21 0226 10/31/21 0748 10/31/21 1513 11/01/21 0630 11/02/21 0558  WBC 13.2* 11.5* 11.7*  --  8.4 8.5  NEUTROABS 12.0* 9.4*  --   --   --   --   HGB 8.4* 8.0* 7.7* 9.9* 7.8* 8.0*  HCT 28.3* 26.3* 25.5* 29.0* 25.2* 25.4*  MCV 93.1 91.0 90.7  --  90.0 87.0  PLT 341 326 331  --  337 356   BMP &GFR Recent Labs  Lab 10/29/21 1709 10/30/21 0226 10/31/21 0748 10/31/21 1513 11/01/21 0630 11/02/21 0558  NA 137 139 137 136 137 137  K 4.1 4.2 4.5 4.5 4.4 4.5  CL 97* 99 100 96* 99 95*  CO2 '28 27 24  '$ --  27 24  GLUCOSE 343* 271* 172* 130* 136* 172*  BUN 20 29* 46* 18 25* 45*  CREATININE 3.38* 4.11* 5.41* 2.70* 4.29* 5.77*  CALCIUM 8.6* 8.7* 8.9  --  8.6* 8.7*  MG  --  2.2  --   --  2.1 2.2  PHOS  --   --  6.0*  --  5.9* 7.3*   Estimated Creatinine Clearance: 5.7 mL/min (A) (by C-G formula based on SCr of 5.77 mg/dL (H)). Liver & Pancreas: Recent Labs  Lab 10/29/21 1709 10/30/21 0226 10/31/21 0748 11/01/21 0630 11/02/21 0558  AST 26 15  --   --   --   ALT 19 16  --   --   --  ALKPHOS 67 62  --   --   --   BILITOT 0.6 0.7  --   --   --   PROT 6.6 6.3*  --   --   --   ALBUMIN 2.3* 2.2* 2.0* 2.1* 2.2*   No results for input(s): "LIPASE", "AMYLASE" in the last 168 hours. No results for input(s): "AMMONIA" in the last 168  hours. Diabetic: Recent Labs    11/02/21 0558  HGBA1C 6.1*   Recent Labs  Lab 11/03/21 1121 11/03/21 1613 11/03/21 2151 11/04/21 0738 11/04/21 1153  GLUCAP 193* 200* 175* 162* 200*   Cardiac Enzymes: No results for input(s): "CKTOTAL", "CKMB", "CKMBINDEX", "TROPONINI" in the last 168 hours. No results for input(s): "PROBNP" in the last 8760 hours. Coagulation Profile: Recent Labs  Lab 10/29/21 1709 11/01/21 0630  INR 1.4* 1.2   Thyroid Function Tests: No results for input(s): "TSH", "T4TOTAL", "FREET4", "T3FREE", "THYROIDAB" in the last 72 hours. Lipid Profile: No results for input(s): "CHOL", "HDL", "LDLCALC", "TRIG", "CHOLHDL", "LDLDIRECT" in the last 72 hours. Anemia Panel: No results for input(s): "VITAMINB12", "FOLATE", "FERRITIN", "TIBC", "IRON", "RETICCTPCT" in the last 72 hours. Urine analysis:    Component Value Date/Time   COLORURINE YELLOW 10/29/2021 1530   APPEARANCEUR HAZY (A) 10/29/2021 1530   LABSPEC 1.015 10/29/2021 1530   PHURINE 7.0 10/29/2021 1530   GLUCOSEU NEGATIVE 10/29/2021 1530   HGBUR SMALL (A) 10/29/2021 1530   BILIRUBINUR NEGATIVE 10/29/2021 1530   KETONESUR NEGATIVE 10/29/2021 1530   PROTEINUR >=300 (A) 10/29/2021 1530   UROBILINOGEN 1.0 06/18/2013 1633   NITRITE NEGATIVE 10/29/2021 1530   LEUKOCYTESUR NEGATIVE 10/29/2021 1530   Sepsis Labs: Invalid input(s): "PROCALCITONIN", "LACTICIDVEN"   SIGNED:  Mercy Riding, MD  [facility] 11/04/2021, 1:43 PM

## 2021-11-04 NOTE — Progress Notes (Signed)
DISCHARGE NOTE HOME MAURINA FAWAZ to be discharged Home per MD order. Discussed prescriptions and follow up appointments with the patient. Prescriptions given to patient; medication list explained in detail. Patient verbalized understanding.  Skin clean, dry and intact without evidence of skin break down, no evidence of skin tears noted. IV catheter discontinued intact. Site without signs and symptoms of complications. Dressing and pressure applied. Pt denies pain at the site currently. No complaints noted.  Patient free of lines, drains, and wounds.   An After Visit Summary (AVS) was printed and given to the patient. Patient escorted via wheelchair, and discharged home via private auto.  Arlyss Repress, RN

## 2021-11-04 NOTE — TOC Transition Note (Signed)
Transition of Care [facility]) - CM/SW Discharge Note   Patient Details  Name: Kim Burns MRN: [id number] Date of Birth: [birth year]  Transition of Care [facility]) CM/SW Contact:  Bartholomew Crews, RN Phone Number: [phone] 11/04/2021, 10:21 AM   Clinical Narrative:     Patient to transition home today. Previous RNCM arranged [facility] with [facility]. HH Face to Face orders in place. Liaison notified of discharge. No further TOC orders identified at this time.   Final next level of care: [place] Barriers to Discharge: No Barriers Identified   Patient Goals and CMS Choice Patient states their goals for this hospitalization and ongoing recovery are:: To return home CMS Medicare.gov Compare Post Acute Care list provided to:: Patient Choice offered to / list presented to : Patient  Discharge Placement                       Discharge Plan and Services   Discharge Planning Services: CM Consult Post Acute Care Choice: Home Health          DME Arranged: N/A DME Agency: NA       HH Arranged: PT HH Agency: [place] Date [place]: 11/04/21 Time [place]: 5747 Representative spoke with at [place]: [place] ([place]) Interventions     Readmission Risk Interventions    08/27/2021   10:16 AM  Readmission Risk Prevention Plan  Transportation Screening Complete  PCP or Specialist Appt within 3-5 Days Complete  HRI or [place] Complete  Social Work Consult for [place] Planning/Counseling Complete  Palliative Care Screening Not Applicable  Medication Review Press photographer) Complete

## 2021-11-04 NOTE — Plan of Care (Signed)
  Problem: Education: Goal: Ability to describe self-care measures that may prevent or decrease complications (Diabetes Survival Skills Education) will improve Outcome: Progressing   

## 2021-11-04 NOTE — Progress Notes (Signed)
    [facility] for Infectious Disease   Reason for visit: Follow up on abscess  Interval History: no growth on culture from abscess drainage  Physical Exam: Constitutional:  Vitals:   11/04/21 0518 11/04/21 0916  BP: (!) 147/79 (!) 142/69  Pulse: 71 65  Resp: 17 18  Temp: 98.2 F (36.8 C) 97.8 F (36.6 C)  SpO2: 96% 96%   patient appears in NAD  Impression: abscess  Plan: 1.  Continue cefazolin for 2 weeks with dialysis.    I will sign off, call with questions

## 2021-11-04 NOTE — Progress Notes (Signed)
Mobility Specialist Progress Note:   11/04/21 1035  Mobility  Activity Ambulated with assistance in hallway  Level of Assistance Standby assist, set-up cues, supervision of patient - no hands on  Assistive Device Front wheel walker  Distance Ambulated (ft) 200 ft  Activity Response Tolerated well  $Mobility charge 1 Mobility   Pt received in chair and eager. No complaints. Pt left in chair with all needs met and call bell in reach.   Kim Burns Mobility Specialist-Acute Rehab Secure Chat only

## 2021-11-05 ENCOUNTER — Telehealth: Payer: Self-pay | Admitting: Nurse Practitioner

## 2021-11-05 DIAGNOSIS — Z992 Dependence on renal dialysis: Secondary | ICD-10-CM | POA: Diagnosis not present

## 2021-11-05 DIAGNOSIS — L299 Pruritus, unspecified: Secondary | ICD-10-CM | POA: Diagnosis not present

## 2021-11-05 DIAGNOSIS — N2581 Secondary hyperparathyroidism of renal origin: Secondary | ICD-10-CM | POA: Diagnosis not present

## 2021-11-05 DIAGNOSIS — N186 End stage renal disease: Secondary | ICD-10-CM | POA: Diagnosis not present

## 2021-11-05 NOTE — Telephone Encounter (Signed)
Transition of care contact from inpatient facility  Date of discharge: 11/04/2021 Date of contact: 11/05/2021 Method: Phone Spoke to: Patient and Son Kim Burns.   Patient contacted to discuss transition of care from recent inpatient hospitalization. Patient was admitted to [facility] from 09/11-09/17/2023with discharge diagnosis of Severe sepsis due to Streptococcus gallolyticus bacteremia and left perinephric abscess  Medication changes were reviewed.  Patient will follow up with his/her outpatient HD unit on: 11/05/2021. She attended HD today.

## 2021-11-06 ENCOUNTER — Ambulatory Visit: Payer: Medicare PPO | Admitting: General Practice

## 2021-11-06 ENCOUNTER — Other Ambulatory Visit: Payer: Self-pay | Admitting: Student

## 2021-11-06 ENCOUNTER — Ambulatory Visit (HOSPITAL_BASED_OUTPATIENT_CLINIC_OR_DEPARTMENT_OTHER): Payer: Medicare PPO | Admitting: Family

## 2021-11-06 ENCOUNTER — Telehealth: Payer: Self-pay | Admitting: Student

## 2021-11-06 ENCOUNTER — Other Ambulatory Visit: Payer: Self-pay | Admitting: Urology

## 2021-11-06 DIAGNOSIS — H04123 Dry eye syndrome of bilateral lacrimal glands: Secondary | ICD-10-CM | POA: Diagnosis not present

## 2021-11-06 DIAGNOSIS — N2889 Other specified disorders of kidney and ureter: Secondary | ICD-10-CM

## 2021-11-06 NOTE — Telephone Encounter (Signed)
Patient's son called inquiring about patient's follow-up for perinephric drain placed 11/02/21. Patient was discharged on 11/04/21. Order placed for follow-up appointment, scheduler to contact patient with details of appointment.

## 2021-11-07 ENCOUNTER — Encounter: Payer: Self-pay | Admitting: Vascular Surgery

## 2021-11-07 ENCOUNTER — Ambulatory Visit: Payer: Medicare PPO | Admitting: Vascular Surgery

## 2021-11-07 VITALS — BP 144/70 | HR 81 | Temp 98.7°F | Resp 20 | Ht 62.0 in | Wt 129.0 lb

## 2021-11-07 DIAGNOSIS — N186 End stage renal disease: Secondary | ICD-10-CM

## 2021-11-07 DIAGNOSIS — Z992 Dependence on renal dialysis: Secondary | ICD-10-CM | POA: Diagnosis not present

## 2021-11-07 DIAGNOSIS — N2581 Secondary hyperparathyroidism of renal origin: Secondary | ICD-10-CM | POA: Diagnosis not present

## 2021-11-07 DIAGNOSIS — L299 Pruritus, unspecified: Secondary | ICD-10-CM | POA: Diagnosis not present

## 2021-11-07 LAB — AEROBIC/ANAEROBIC CULTURE W GRAM STAIN (SURGICAL/DEEP WOUND): Culture: NO GROWTH

## 2021-11-07 NOTE — Progress Notes (Deleted)
Cardiology Office Note   Date:  11/07/2021   ID:  Augusta, Mirkin April 08, 1935, MRN [id number]  PCP:  [name], MD  Cardiologist:   Richard Ritchey [place], MD   No chief complaint on file.     History of Present Illness: Kim Burns is a 86 y.o. female who is seen at the request of Dr Ernie Hew.  She was seen remotely by me in 2014 for evaluation of bradycardia. She has a history of CKD and HTN. Echo in 2015 showed LVH with normal systolic function. She had a Whipple procedure in 2014 with pancreatectomy and partial bowel resection. She developed ESRD felt to be related to DM and HTN. She has been on dialysis since January this year. With dialysis she has developed orthostatic hypotension that is worse on dialysis days. Her antihypertensives have been discontinued and she is now on midodrine on dialysis days. No syncope. She also has bilateral carpel tunnel syndrome and surgery has been recommended. Seeing Dr Fredna Dow. She has a slow growing renal cancer on her left kidney and underwent ablation of this in July.  She denies any chest pain or SOB. Does tend to bleed a lot from her vascular access site with dialysis.   In July she developed  new onset atrial fibrillation with RVR during hospitalization in early July 2023 after an ablation procedure for left renal cell carcinoma. She was started on rate control agents as well as Eliquis for stroke prophylaxis.  She had spontaneously converted back to sinus rhythm prior to discharge after receiving IV Cardizem. CTA of abdomen and chest did not show any evidence of PE, 3 mm left pulmonary nodule noted, stable solid mass in the medial aspect of the left kidney.  Diagnostic cardiac catheterization on 7/10 due to elevated troponins on 7/8 revealed nonobstructive disease with 20% proximal RCA, 20% left circumflex artery, 20% mid left main, 30% ostial LAD, 50% proximal LAD, EF 55 to 65%.  Repeat echo obtained on the same day revealed LVEF of 60 to 65%,  moderately reduced RV systolic function, grade 3 diastolic dysfunction, RSVP 46.1 mmHg, severe biatrial enlargement, mild MR, mild to moderate TR.    She was admitted 9/11-9/17/23 with acute sepsis. CT abdomen and pelvis in ED showed interval development of post ablation changes to left kidney with additional interval development of severe hydronephrosis with obstruction of the proximal ureter.  She also had lactic acidosis and leukocytosis.  Cultures obtained.  She was started on vancomycin and Zosyn. Blood culture with Streptococcus gallolyticus in 1 out of 2 bottles.  Patient had left ureteral stent placement by urology on 9/13, and drain placed for left perinephric abscess by IR on 9/15.     Past Medical History:  Diagnosis Date   Anemia of chronic disease    takes iron   Anxiety    Blood transfusion without reported diagnosis    Bradycardia    Diabetes mellitus without complication ([place])    became diabetic after Whipple procedure   Diarrhea    Dysrhythmia 04/16/2016   bradycardia due to medication    ESRD on hemodialysis [facility])    M-W-F   GERD (gastroesophageal reflux disease)    Gout    Headache    History of kidney stones 06/2013   Hyperparathyroidism ([place])    Hypertension    PONV (postoperative nausea and vomiting)    Sleep apnea    no cpap machine. could not tolerate   Stroke ([place]) 04/16/2016  TIA 1995   Vitamin D deficiency     Past Surgical History:  Procedure Laterality Date   A/V FISTULAGRAM Left 08/30/2021   Procedure: A/V Fistulagram;  Surgeon: Marty Heck, MD;  Location: [place] [facility];  Service: Cardiovascular;  Laterality: Left;   ABDOMINAL HYSTERECTOMY  1985   complete   BACK SURGERY  3716   lower   BASCILIC VEIN TRANSPOSITION Left 04/24/2017   Procedure: LEFT ARM FIRST STAGE BASILIC VEIN TRANSPOSITION;  Surgeon: Serafina Mitchell, MD;  Location: [facility] OR;  Service: Vascular;  Laterality: Left;   [place] Left 07/10/2017    Procedure: SECOND STAGE BASILIC VEIN TRANSPOSITION LEFT ARM;  Surgeon: Serafina Mitchell, MD;  Location: [facility] OR;  Service: Vascular;  Laterality: Left;   BREAST LUMPECTOMY Left x 2   many years apart, benign   CHOLECYSTECTOMY  1985   CYSTOSCOPY W/ URETERAL STENT PLACEMENT Left 10/31/2021   Procedure: CYSTOSCOPY WITH RETROGRADE PYELOGRAM/URETERAL STENT PLACEMENT;  Surgeon: Ardis Hughs, MD;  Location: [facility];  Service: Urology;  Laterality: Left;   CYSTOSCOPY WITH URETEROSCOPY AND STENT PLACEMENT Left 06/18/2013   Procedure: CYSTOSCOPY WITH Lef URETEROSCOPY AND Left STENT PLACEMENT;  Surgeon: Dutch Gray, MD;  Location: [facility];  Service: Urology;  Laterality: Left;   ESOPHAGOGASTRODUODENOSCOPY (EGD) WITH PROPOFOL N/A 11/13/2016   Procedure: ESOPHAGOGASTRODUODENOSCOPY (EGD) WITH PROPOFOL;  Surgeon: Otis Brace, MD;  Location: [place] [facility];  Service: Gastroenterology;  Laterality: N/A;   EUS N/A 02/07/2016   Procedure: ESOPHAGEAL ENDOSCOPIC ULTRASOUND (EUS) RADIAL;  Surgeon: Arta Silence, MD;  [facility];  Service: Endoscopy;  Laterality: N/A;   EYE SURGERY Bilateral 2014   ioc for catracts    FOOT SURGERY Left 1990   something with toes unsure what    HERNIA REPAIR  2018   IR EMBO TUMOR ORGAN ISCHEMIA INFARCT INC GUIDE ROADMAPPING  08/22/2021   IR RADIOLOGIST EVAL & MGMT  05/18/2021   IR RADIOLOGIST EVAL & MGMT  10/01/2021   IR RENAL SUPRASEL UNI S&I MOD SED  08/22/2021   IR US GUIDE VASC ACCESS RIGHT  08/22/2021   LEFT HEART CATH AND CORONARY ANGIOGRAPHY N/A 08/27/2021   Procedure: LEFT HEART CATH AND CORONARY ANGIOGRAPHY;  Surgeon: Burnell Blanks, MD;  Location: [facility];  Service: Cardiovascular;  Laterality: N/A;   PARATHYROID EXPLORATION     PERIPHERAL VASCULAR BALLOON ANGIOPLASTY Left 08/30/2021   Procedure: PERIPHERAL VASCULAR BALLOON ANGIOPLASTY;  Surgeon: Marty Heck, MD;  Location: [place] [facility];  Service: Cardiovascular;  Laterality:  Left;  arm fistula   RADIOLOGY WITH ANESTHESIA Left 08/22/2021   Procedure: MICROWAVE ABLATION;  Surgeon: Sandi Mariscal, MD;  Location: [facility];  Service: Anesthesiology;  Laterality: Left;   WHIPPLE PROCEDURE N/A 04/23/2016   Procedure: WHIPPLE PROCEDURE;  Surgeon: Stark Klein, MD;  Location: [facility];  Service: General;  Laterality: N/A;     Current Outpatient Medications  Medication Sig Dispense Refill   acetaminophen (TYLENOL 8 HOUR ARTHRITIS PAIN) 650 MG CR tablet Take 1,300 mg by mouth daily as needed for pain.     amiodarone (PACERONE) 200 MG tablet Take 1 tablet (200 mg total) by mouth daily. Please keep appointment for further refills 30 tablet 1   apixaban (ELIQUIS) 2.5 MG TABS tablet Take 1 tablet (2.5 mg total) by mouth 2 (two) times daily. 60 tablet 5   carvedilol (COREG) 3.125 MG tablet Take 1 tablet (3.125 mg total) by mouth 2 (two) times daily with a  meal. Take on the days you do not have dialysis 60 tablet 1   ceFAZolin (ANCEF) 2-4 GM/100ML-% IVPB Inject 100 mLs (2 g total) into the vein every Monday, Wednesday, and Friday with hemodialysis for 9 days. 1 each    Cholecalciferol (VITAMIN D-3 PO) Take 1 capsule by mouth every Monday, Wednesday, and Friday with hemodialysis.     colestipol (COLESTID) 1 g tablet Take 1 g by mouth See admin instructions. 1 tablet (1g) prior to dialysis M-W-F + 1 tablet daily PRN diarrhea     diclofenac sodium (VOLTAREN) 1 % GEL Apply 2 g topically 4 (four) times daily as needed (for hand pain).      insulin aspart (NOVOLOG FLEXPEN) 100 UNIT/ML FlexPen Inject 10 Units into the skin 3 (three) times daily as needed (CBG > 200).     lidocaine-prilocaine (EMLA) cream Apply 1 Application topically every Monday, Wednesday, and Friday with hemodialysis.     lipase/protease/amylase (CREON) 36000 UNITS CPEP capsule Take 1 capsule (36,000 Units total) by mouth with snacks. (Patient taking differently: Take 36,000-72,000 Units by mouth See admin instructions. 2  capsules(72000 units) TID with meals AND 1 capsule(36000 units) with snacks) 270 capsule 0   midodrine (PROAMATINE) 10 MG tablet Take 10 mg by mouth as directed.     polyethylene glycol powder (MIRALAX) 17 GM/SCOOP powder Take 17 g by mouth 2 (two) times daily as needed for moderate constipation. 255 g 0   senna-docusate (SENOKOT-S) 8.6-50 MG tablet Take 1 tablet by mouth 2 (two) times daily between meals as needed for mild constipation. 180 tablet 0   sevelamer carbonate (RENVELA) 800 MG tablet Take 1,600 mg by mouth 3 (three) times daily with meals.     sodium chloride flush (NS) 0.9 % SOLN 5 mLs by Intracatheter route every 8 (eight) hours.     XIIDRA 5 % SOLN Place 1 drop into both eyes at bedtime.     No current facility-administered medications for this visit.    Allergies:   Lopid [gemfibrozil], Lotemax [loteprednol etabonate], Statins, Norco [hydrocodone-acetaminophen], Other, Tomato, Vibra-tab [doxycycline], Codeine, and Hydrocodone-acetaminophen    Social History:  The patient  reports that she has never smoked. She has never used smokeless tobacco. She reports that she does not drink alcohol and does not use drugs.   Family History:  The patient's family history includes Alzheimer's disease in her sister and sister; Cancer in her brother and father; Heart disease in her mother; Stroke in her mother.    ROS:  Please see the history of present illness.   Otherwise, review of systems are positive for none.   All other systems are reviewed and negative.    PHYSICAL EXAM: VS:  There were no vitals taken for this visit. , BMI There is no height or weight on file to calculate BMI. GEN: Well nourished, well developed, in no acute distress.  HEENT: normal Neck: no JVD, carotid bruits, or masses Cardiac: RRR; radiated murmur from her AV fistula. no murmurs, rubs, or gallops,no edema. Functioning AV fistula in left upper arm. Respiratory:  clear to auscultation bilaterally, normal work of  breathing GI: soft, nontender, nondistended, + BS MS: no deformity or atrophy Skin: warm and dry, no rash Neuro:  Strength and sensation are intact Psych: euthymic mood, full affect   EKG:  EKG is ordered today. The ekg ordered today demonstrates NSR rate 71. Borderline low voltage. I have personally reviewed and interpreted this study.    Recent Labs: 09/05/2021: B Natriuretic  Peptide 2,213.9 10/25/2021: TSH 1.480 10/30/2021: ALT 16 11/02/2021: BUN 45; Creatinine, Ser 5.77; Hemoglobin 8.0; Magnesium 2.2; Platelets 356; Potassium 4.5; Sodium 137    Lipid Panel    Component Value Date/Time   CHOL 95 08/26/2021 0447   CHOL 133 08/09/2021 1143   TRIG 110 08/26/2021 0447   HDL 35 (L) 08/26/2021 0447   HDL 43 08/09/2021 1143   CHOLHDL 2.7 08/26/2021 0447   VLDL 22 08/26/2021 0447   LDLCALC 38 08/26/2021 0447   LDLCALC 68 08/09/2021 1143      Wt Readings from Last 3 Encounters:  11/02/21 129 lb 10.1 oz (58.8 kg)  09/19/21 126 lb 6.4 oz (57.3 kg)  09/08/21 128 lb 1.4 oz (58.1 kg)      Other studies Reviewed: Additional studies/ records that were reviewed today include:   Echo 06/19/13: Study Conclusions   - Left ventricle: The cavity size was normal. There was    severe focal basal and moderate concentric hypertrophy.    Systolic function was normal. The estimated ejection    fraction was in the range of 60% to 65%. Wall motion was    normal; there were no regional wall motion abnormalities.  - Aortic valve: Valve area: 1.69cm^2 (Vmax).  - Mitral valve: Mild to moderate regurgitation.  - Left atrium: The atrium was mildly dilated.  - Atrial septum: There was increased thickness of the    septum, consistent with lipomatous hypertrophy.   Echo 08/27/21: IMPRESSIONS     1. Right ventricular systolic function is moderately reduced. The right  ventricular size is mildly enlarged. There is moderately elevated  pulmonary artery systolic pressure. The estimated right  ventricular  systolic pressure is 31.5 mmHg. Normal apical  function with free wall hypokinesis, consistent with McConnell's sign as  can be seen in acute PE (image 58). Consider evaluation for PE.   2. Left ventricular ejection fraction, by estimation, is 60 to 65%. The  left ventricle has normal function. The left ventricle has no regional  wall motion abnormalities. Left ventricular diastolic parameters are  consistent with Grade III diastolic  dysfunction (restrictive). Elevated left atrial pressure.   3. Left atrial size was severely dilated.   4. Right atrial size was severely dilated.   5. The mitral valve is degenerative. Mild mitral valve regurgitation. No  evidence of mitral stenosis. Moderate mitral annular calcification.   6. Tricuspid valve regurgitation is mild to moderate.   7. The aortic valve is tricuspid. Aortic valve regurgitation is not  visualized. Aortic valve sclerosis is present, with no evidence of aortic  valve stenosis.   8. The inferior vena cava is dilated in size with <50% respiratory  variability, suggesting right atrial pressure of 15 mmHg.   Cardiac cath 08/27/21:  LEFT HEART CATH AND CORONARY ANGIOGRAPHY   Conclusion      Prox RCA lesion is 20% stenosed.   Mid LM to Dist LM lesion is 20% stenosed.   Ost LAD lesion is 30% stenosed.   Mid Cx lesion is 20% stenosed.   Prox LAD lesion is 50% stenosed.   The left ventricular systolic function is normal.   LV end diastolic pressure is normal.   The left ventricular ejection fraction is 55-65% by visual estimate.   There is no mitral valve regurgitation.   Mild non-obstructive CAD Normal LV systolic function Elevated troponin and chest pain is likely due to demand ischemic in the setting of rapid atrial fibrillation.    Recommendation: Medical management of  CAD.   Coronary Diagrams  Diagnostic Dominance: Right  Interve  ASSESSMENT AND PLAN:  1.  ESRD on dialysis 2. Orthostatic hypotension.  Off antihypertensive therapy. On midodrine on dialysis days. With constellation of findings need to consider possibility of systemic amyloid.  3. Bilateral carpel tunnel syndrome 4. DM 5. History of HTN 6. Renal tumor s/p ablation 7. Recent sepsis with hydronephrosis and abscess. S/p drain.  8. Nonobstructive CAD 9. Paroxysmal Afib following ablation procedure. Converted spontaneously   Will check Echo. If LV function is OK she will be able to proceed with planing surgical procedures. If she has features of amyloid will need to check SPEP and consider a PYP scan.   Current medicines are reviewed at length with the patient today.  The patient does not have concerns regarding medicines.  The following changes have been made:  no change  Labs/ tests ordered today include:   No orders of the defined types were placed in this encounter.        Disposition:   FU with me TBD   Signed, Aniello Christopoulos [place], MD  11/07/2021 2:47 PM    [facility] [address] [place], [place], [address] Phone [phone], Fax [phone]

## 2021-11-07 NOTE — Progress Notes (Signed)
Patient ID: Kim Burns, female   DOB: [birth year], 86 y.o.   MRN: [id number]  Reason for Consult: Follow-up   Referred by Rosita Fire, *  Subjective:     HPI:  Kim Burns is a 86 y.o. female with history of a two-stage left upper arm AV fistula recent underwent balloon angioplasty back in July of a tight stenosis in the mid upper arm.  More recently she was admitted to the hospital with infection not involving the fistula but was noted to have swelling in the left forearm that subsequently resolved.  She does take Eliquis daily and has had some issues with bleeding after dialysis.  She had dialysis earlier today not currently bleeding states that the fistula is working well without any alarms while on dialysis.  Past Medical History:  Diagnosis Date   Anemia of chronic disease    takes iron   Anxiety    Blood transfusion without reported diagnosis    Bradycardia    Diabetes mellitus without complication ([place])    became diabetic after Whipple procedure   Diarrhea    Dysrhythmia 04/16/2016   bradycardia due to medication    ESRD on hemodialysis [facility])    M-W-F   GERD (gastroesophageal reflux disease)    Gout    Headache    History of kidney stones 06/2013   Hyperparathyroidism ([place])    Hypertension    PONV (postoperative nausea and vomiting)    Sleep apnea    no cpap machine. could not tolerate   Stroke ([place]) 04/16/2016   TIA 1995   Vitamin D deficiency    Family History  Problem Relation Age of Onset   Heart disease Mother    Stroke Mother    Cancer Father        colon   Alzheimer's disease Sister    Alzheimer's disease Sister    Cancer Brother        brain   Breast cancer Neg Hx    Past Surgical History:  Procedure Laterality Date   A/V FISTULAGRAM Left 08/30/2021   Procedure: A/V Fistulagram;  Surgeon: Marty Heck, MD;  Location: [place] [facility];  Service: Cardiovascular;  Laterality: Left;   ABDOMINAL HYSTERECTOMY  1985    complete   BACK SURGERY  2119   lower   BASCILIC VEIN TRANSPOSITION Left 04/24/2017   Procedure: LEFT ARM FIRST STAGE BASILIC VEIN TRANSPOSITION;  Surgeon: Serafina Mitchell, MD;  Location: [facility];  Service: Vascular;  Laterality: Left;   [place] Left 07/10/2017   Procedure: SECOND STAGE BASILIC VEIN TRANSPOSITION LEFT ARM;  Surgeon: Serafina Mitchell, MD;  Location: [facility];  Service: Vascular;  Laterality: Left;   BREAST LUMPECTOMY Left x 2   many years apart, benign   CHOLECYSTECTOMY  1985   CYSTOSCOPY W/ URETERAL STENT PLACEMENT Left 10/31/2021   Procedure: CYSTOSCOPY WITH RETROGRADE PYELOGRAM/URETERAL STENT PLACEMENT;  Surgeon: Ardis Hughs, MD;  Location: [place];  Service: Urology;  Laterality: Left;   CYSTOSCOPY WITH URETEROSCOPY AND STENT PLACEMENT Left 06/18/2013   Procedure: CYSTOSCOPY WITH Lef URETEROSCOPY AND Left STENT PLACEMENT;  Surgeon: Dutch Gray, MD;  Location: [facility];  Service: Urology;  Laterality: Left;   ESOPHAGOGASTRODUODENOSCOPY (EGD) WITH PROPOFOL N/A 11/13/2016   Procedure: ESOPHAGOGASTRODUODENOSCOPY (EGD) WITH PROPOFOL;  Surgeon: Otis Brace, MD;  Location: [place];  Service: Gastroenterology;  Laterality: N/A;   EUS N/A 02/07/2016   Procedure: ESOPHAGEAL ENDOSCOPIC ULTRASOUND (EUS) RADIAL;  Surgeon: Arta Silence,  MD;  Location: [facility];  Service: Endoscopy;  Laterality: N/A;   EYE SURGERY Bilateral 2014   ioc for catracts    FOOT SURGERY Left 1990   something with toes unsure what    HERNIA REPAIR  2018   IR EMBO TUMOR ORGAN ISCHEMIA INFARCT INC GUIDE ROADMAPPING  08/22/2021   IR RADIOLOGIST EVAL & MGMT  05/18/2021   IR RADIOLOGIST EVAL & MGMT  10/01/2021   IR RENAL SUPRASEL UNI S&I MOD SED  08/22/2021   IR US GUIDE VASC ACCESS RIGHT  08/22/2021   LEFT HEART CATH AND CORONARY ANGIOGRAPHY N/A 08/27/2021   Procedure: LEFT HEART CATH AND CORONARY ANGIOGRAPHY;  Surgeon: Burnell Blanks, MD;  Location: [place] [facility];   Service: Cardiovascular;  Laterality: N/A;   PARATHYROID EXPLORATION     PERIPHERAL VASCULAR BALLOON ANGIOPLASTY Left 08/30/2021   Procedure: PERIPHERAL VASCULAR BALLOON ANGIOPLASTY;  Surgeon: Marty Heck, MD;  Location: [place] [facility];  Service: Cardiovascular;  Laterality: Left;  arm fistula   RADIOLOGY WITH ANESTHESIA Left 08/22/2021   Procedure: MICROWAVE ABLATION;  Surgeon: Sandi Mariscal, MD;  Location: [facility];  Service: Anesthesiology;  Laterality: Left;   WHIPPLE PROCEDURE N/A 04/23/2016   Procedure: WHIPPLE PROCEDURE;  Surgeon: Stark Klein, MD;  Location: [place];  Service: General;  Laterality: N/A;    Short Social History:  Social History   Tobacco Use   Smoking status: Never   Smokeless tobacco: Never  Substance Use Topics   Alcohol use: No    Allergies  Allergen Reactions   Lopid [Gemfibrozil] Other (See Comments)    Myalgia    Lotemax [Loteprednol Etabonate] Rash    Skin burning    Statins Other (See Comments)    Muscle weakness   Norco [Hydrocodone-Acetaminophen] Other (See Comments)    Headaches  Tolerates acetaminophen    Other Diarrhea    Real Butter - diarrhea   Tomato Other (See Comments)    Belching    Vibra-Tab [Doxycycline] Other (See Comments)    Unknown reaction   Codeine Other (See Comments)    headache   Hydrocodone-Acetaminophen Other (See Comments)    Headache. Tolerates acetaminophen.    Current Outpatient Medications  Medication Sig Dispense Refill   acetaminophen (TYLENOL 8 HOUR ARTHRITIS PAIN) 650 MG CR tablet Take 1,300 mg by mouth daily as needed for pain.     amiodarone (PACERONE) 200 MG tablet Take 1 tablet (200 mg total) by mouth daily. Please keep appointment for further refills 30 tablet 1   apixaban (ELIQUIS) 2.5 MG TABS tablet Take 1 tablet (2.5 mg total) by mouth 2 (two) times daily. 60 tablet 5   carvedilol (COREG) 3.125 MG tablet Take 1 tablet (3.125 mg total) by mouth 2 (two) times daily with a meal. Take on the days  you do not have dialysis 60 tablet 1   ceFAZolin (ANCEF) 2-4 GM/100ML-% IVPB Inject 100 mLs (2 g total) into the vein every Monday, Wednesday, and Friday with hemodialysis for 9 days. 1 each    Cholecalciferol (VITAMIN D-3 PO) Take 1 capsule by mouth every Monday, Wednesday, and Friday with hemodialysis.     colestipol (COLESTID) 1 g tablet Take 1 g by mouth See admin instructions. 1 tablet (1g) prior to dialysis M-W-F + 1 tablet daily PRN diarrhea     diclofenac sodium (VOLTAREN) 1 % GEL Apply 2 g topically 4 (four) times daily as needed (for hand pain).      insulin aspart (NOVOLOG FLEXPEN)  100 UNIT/ML FlexPen Inject 10 Units into the skin 3 (three) times daily as needed (CBG > 200).     lidocaine-prilocaine (EMLA) cream Apply 1 Application topically every Monday, Wednesday, and Friday with hemodialysis.     lipase/protease/amylase (CREON) 36000 UNITS CPEP capsule Take 1 capsule (36,000 Units total) by mouth with snacks. (Patient taking differently: Take 36,000-72,000 Units by mouth See admin instructions. 2 capsules(72000 units) TID with meals AND 1 capsule(36000 units) with snacks) 270 capsule 0   midodrine (PROAMATINE) 10 MG tablet Take 10 mg by mouth as directed.     polyethylene glycol powder (MIRALAX) 17 GM/SCOOP powder Take 17 g by mouth 2 (two) times daily as needed for moderate constipation. 255 g 0   senna-docusate (SENOKOT-S) 8.6-50 MG tablet Take 1 tablet by mouth 2 (two) times daily between meals as needed for mild constipation. 180 tablet 0   sevelamer carbonate (RENVELA) 800 MG tablet Take 1,600 mg by mouth 3 (three) times daily with meals.     sodium chloride flush (NS) 0.9 % SOLN 5 mLs by Intracatheter route every 8 (eight) hours.     XIIDRA 5 % SOLN Place 1 drop into both eyes at bedtime.     No current facility-administered medications for this visit.    Review of Systems  Constitutional:  Constitutional negative. HENT: HENT negative.  Eyes: Eyes negative.  Respiratory:  Respiratory negative.  GI: Gastrointestinal negative.  Musculoskeletal:       Left upper extremity swelling now resolved Skin: Skin negative.  Neurological: Neurological negative. Hematologic: Positive for bruises/bleeds easily.  Psychiatric: Psychiatric negative.        Objective:  Objective   Vitals:   11/07/21 1517  BP: (!) 144/70  Pulse: 81  Resp: 20  Temp: 98.7 F (37.1 C)  SpO2: 99%  Weight: 129 lb (58.5 kg)  Height: '5\' 2"'$  (1.575 m)   Body mass index is 23.59 kg/m.  Physical Exam HENT:     Nose: Nose normal.  Eyes:     Pupils: Pupils are equal, round, and reactive to light.  Cardiovascular:     Rate and Rhythm: Normal rate.  Abdominal:     General: Abdomen is flat.     Palpations: Abdomen is soft.  Musculoskeletal:     Comments: Left upper extremity fistula with very strong thrill Very thin skin surrounding the fistula but no evidence of pseudoaneurysmal degeneration or ulceration of the fistula  Neurological:     General: No focal deficit present.     Mental Status: She is alert.  Psychiatric:        Mood and Affect: Mood normal.        Thought Content: Thought content normal.     Data: No studies today     Assessment/Plan:     86 year old female on dialysis via left arm basilic vein fistula recent balloon angioplasty.  She has had some bleeding but is also on Eliquis there is no evidence of thinning skin other than her normal thin skin.  Given that the patient is working well I have recommended continued use and she can call if there is further swelling of the arm and we can perform a fistulogram.  The only option would really be to convert this to a graft out laterally but I think similar issues would arise given the thinning of skin.  Currently Kim Burns good understanding she will call to follow-up on an as-needed basis.     Waynetta Sandy MD [facility]  [facility]

## 2021-11-08 DIAGNOSIS — C642 Malignant neoplasm of left kidney, except renal pelvis: Secondary | ICD-10-CM | POA: Diagnosis not present

## 2021-11-08 DIAGNOSIS — Z992 Dependence on renal dialysis: Secondary | ICD-10-CM | POA: Diagnosis not present

## 2021-11-08 DIAGNOSIS — T82590A Other mechanical complication of surgically created arteriovenous fistula, initial encounter: Secondary | ICD-10-CM | POA: Diagnosis not present

## 2021-11-08 DIAGNOSIS — N186 End stage renal disease: Secondary | ICD-10-CM | POA: Diagnosis not present

## 2021-11-08 DIAGNOSIS — I4891 Unspecified atrial fibrillation: Secondary | ICD-10-CM | POA: Diagnosis not present

## 2021-11-08 DIAGNOSIS — N151 Renal and perinephric abscess: Secondary | ICD-10-CM | POA: Diagnosis not present

## 2021-11-08 DIAGNOSIS — I10 Essential (primary) hypertension: Secondary | ICD-10-CM | POA: Diagnosis not present

## 2021-11-09 DIAGNOSIS — Z992 Dependence on renal dialysis: Secondary | ICD-10-CM | POA: Diagnosis not present

## 2021-11-09 DIAGNOSIS — N186 End stage renal disease: Secondary | ICD-10-CM | POA: Diagnosis not present

## 2021-11-09 DIAGNOSIS — N2581 Secondary hyperparathyroidism of renal origin: Secondary | ICD-10-CM | POA: Diagnosis not present

## 2021-11-09 DIAGNOSIS — L299 Pruritus, unspecified: Secondary | ICD-10-CM | POA: Diagnosis not present

## 2021-11-12 ENCOUNTER — Ambulatory Visit
Admission: RE | Admit: 2021-11-12 | Discharge: 2021-11-12 | Disposition: A | Discharge status: Home / Self Care | Payer: Medicare PPO | Source: Ambulatory Visit | Attending: Urology | Admitting: Urology

## 2021-11-12 ENCOUNTER — Ambulatory Visit
Admission: RE | Admit: 2021-11-12 | Discharge: 2021-11-12 | Disposition: A | Discharge status: Home / Self Care | Payer: Medicare PPO | Source: Ambulatory Visit | Attending: Student | Admitting: Student

## 2021-11-12 ENCOUNTER — Other Ambulatory Visit: Payer: Self-pay | Admitting: Urology

## 2021-11-12 ENCOUNTER — Ambulatory Visit: Payer: Medicare PPO | Admitting: Cardiology

## 2021-11-12 ENCOUNTER — Encounter: Payer: Self-pay | Admitting: Radiology

## 2021-11-12 DIAGNOSIS — N2581 Secondary hyperparathyroidism of renal origin: Secondary | ICD-10-CM | POA: Diagnosis not present

## 2021-11-12 DIAGNOSIS — N2889 Other specified disorders of kidney and ureter: Secondary | ICD-10-CM

## 2021-11-12 DIAGNOSIS — L299 Pruritus, unspecified: Secondary | ICD-10-CM | POA: Diagnosis not present

## 2021-11-12 DIAGNOSIS — R109 Unspecified abdominal pain: Secondary | ICD-10-CM | POA: Diagnosis not present

## 2021-11-12 DIAGNOSIS — Z992 Dependence on renal dialysis: Secondary | ICD-10-CM | POA: Diagnosis not present

## 2021-11-12 DIAGNOSIS — N186 End stage renal disease: Secondary | ICD-10-CM | POA: Diagnosis not present

## 2021-11-12 HISTORY — PX: IR RADIOLOGIST EVAL & MGMT: IMG5224

## 2021-11-12 NOTE — Progress Notes (Signed)
Patient ID: Kim Burns, female   DOB: 1935-07-14, 86 y.o.   MRN: [id number]        Chief Complaint: Urinoma post renal embolization and ablation  Referring Physician: Louis Meckel  History of Present Illness: Kim Burns is a 86 y.o. female with complex past medical history significant for stroke, hypertension, hyperparathyroidism, diabetes, previous Whipple procedure (performed for benign etiology) and end-stage renal disease, on dialysis who underwent percutaneous coil embolization and microwave ablation of left-sided renal cell carcinoma on 56/43/3295 complicated by development of a delayed urinary leak requiring placement of a left-sided double-J ureteral stent on 10/31/2021 as well as a left-sided perinephric drainage catheter on 11/02/2021.    The patient presents today to the interventional radiology drain clinic for drainage catheter evaluation and management.    While the output from the left-sided perinephric drainage catheter initially was scant and predominantly bloody, recently the output has increased and now resembles urine.    The Patient denies worsening pain, fever or chills.  She is completing her prescribed course of intravenous antibiotics (administered following her dialysis sessions).      Past Medical History:  Diagnosis Date   Anemia of chronic disease    takes iron   Anxiety    Blood transfusion without reported diagnosis    Bradycardia    Diabetes mellitus without complication ([place])    became diabetic after Whipple procedure   Diarrhea    Dysrhythmia 04/16/2016   bradycardia due to medication    ESRD on hemodialysis [facility])    M-W-F   GERD (gastroesophageal reflux disease)    Gout    Headache    History of kidney stones 06/2013   Hyperparathyroidism ([place])    Hypertension    PONV (postoperative nausea and vomiting)    Sleep apnea    no cpap machine. could not tolerate   Stroke ([place]) 04/16/2016   TIA 1995   Vitamin D deficiency     Past  Surgical History:  Procedure Laterality Date   A/V FISTULAGRAM Left 08/30/2021   Procedure: A/V Fistulagram;  Surgeon: Marty Heck, MD;  Location: [place] [facility];  Service: Cardiovascular;  Laterality: Left;   ABDOMINAL HYSTERECTOMY  1985   complete   BACK SURGERY  1884   lower   BASCILIC VEIN TRANSPOSITION Left 04/24/2017   Procedure: LEFT ARM FIRST STAGE BASILIC VEIN TRANSPOSITION;  Surgeon: Serafina Mitchell, MD;  Location: [facility];  Service: Vascular;  Laterality: Left;   [place] Left 07/10/2017   Procedure: SECOND STAGE BASILIC VEIN TRANSPOSITION LEFT ARM;  Surgeon: Serafina Mitchell, MD;  Location: [facility];  Service: Vascular;  Laterality: Left;   BREAST LUMPECTOMY Left x 2   many years apart, benign   CHOLECYSTECTOMY  1985   CYSTOSCOPY W/ URETERAL STENT PLACEMENT Left 10/31/2021   Procedure: CYSTOSCOPY WITH RETROGRADE PYELOGRAM/URETERAL STENT PLACEMENT;  Surgeon: Ardis Hughs, MD;  Location: [place];  Service: Urology;  Laterality: Left;   CYSTOSCOPY WITH URETEROSCOPY AND STENT PLACEMENT Left 06/18/2013   Procedure: CYSTOSCOPY WITH Lef URETEROSCOPY AND Left STENT PLACEMENT;  Surgeon: Dutch Gray, MD;  Location: [facility];  Service: Urology;  Laterality: Left;   ESOPHAGOGASTRODUODENOSCOPY (EGD) WITH PROPOFOL N/A 11/13/2016   Procedure: ESOPHAGOGASTRODUODENOSCOPY (EGD) WITH PROPOFOL;  Surgeon: Otis Brace, MD;  Location: [place];  Service: Gastroenterology;  Laterality: N/A;   EUS N/A 02/07/2016   Procedure: ESOPHAGEAL ENDOSCOPIC ULTRASOUND (EUS) RADIAL;  Surgeon: Arta Silence, MD;  [facility];  Service: Endoscopy;  Laterality: N/A;   EYE SURGERY Bilateral 2014   ioc for catracts    FOOT SURGERY Left 1990   something with toes unsure what    HERNIA REPAIR  2018   IR EMBO TUMOR ORGAN ISCHEMIA INFARCT INC GUIDE ROADMAPPING  08/22/2021   IR RADIOLOGIST EVAL & MGMT  05/18/2021   IR RADIOLOGIST EVAL & MGMT  10/01/2021   IR RADIOLOGIST  EVAL & MGMT  11/12/2021   IR RENAL SUPRASEL UNI S&I MOD SED  08/22/2021   IR US GUIDE VASC ACCESS RIGHT  08/22/2021   LEFT HEART CATH AND CORONARY ANGIOGRAPHY N/A 08/27/2021   Procedure: LEFT HEART CATH AND CORONARY ANGIOGRAPHY;  Surgeon: Burnell Blanks, MD;  Location: [place] [facility];  Service: Cardiovascular;  Laterality: N/A;   PARATHYROID EXPLORATION     PERIPHERAL VASCULAR BALLOON ANGIOPLASTY Left 08/30/2021   Procedure: PERIPHERAL VASCULAR BALLOON ANGIOPLASTY;  Surgeon: Marty Heck, MD;  Location: [place] [facility];  Service: Cardiovascular;  Laterality: Left;  arm fistula   RADIOLOGY WITH ANESTHESIA Left 08/22/2021   Procedure: MICROWAVE ABLATION;  Surgeon: Sandi Mariscal, MD;  Location: [facility];  Service: Anesthesiology;  Laterality: Left;   WHIPPLE PROCEDURE N/A 04/23/2016   Procedure: WHIPPLE PROCEDURE;  Surgeon: Stark Klein, MD;  Location: [place];  Service: General;  Laterality: N/A;    Allergies: Lopid [gemfibrozil], Lotemax [loteprednol etabonate], Statins, Norco [hydrocodone-acetaminophen], Other, Tomato, Vibra-tab [doxycycline], Codeine, and Hydrocodone-acetaminophen  Medications: Prior to Admission medications   Medication Sig Start Date End Date Taking? Authorizing Provider  acetaminophen (TYLENOL 8 HOUR ARTHRITIS PAIN) 650 MG CR tablet Take 1,300 mg by mouth daily as needed for pain.    [provider]  amiodarone (PACERONE) 200 MG tablet Take 1 tablet (200 mg total) by mouth daily. Please keep appointment for further refills 10/01/21   [place], Peter M, MD  apixaban (ELIQUIS) 2.5 MG TABS tablet Take 1 tablet (2.5 mg total) by mouth 2 (two) times daily. 09/25/21 03/24/22  [place], Peter M, MD  carvedilol (COREG) 3.125 MG tablet Take 1 tablet (3.125 mg total) by mouth 2 (two) times daily with a meal. Take on the days you do not have dialysis 11/04/21   Mercy Riding, MD  ceFAZolin (ANCEF) 2-4 GM/100ML-% IVPB Inject 100 mLs (2 g total) into the vein every Monday,  Wednesday, and Friday with hemodialysis for 9 days. 11/05/21 11/14/21  Mercy Riding, MD  Cholecalciferol (VITAMIN D-3 PO) Take 1 capsule by mouth every Monday, Wednesday, and Friday with hemodialysis.    [provider]  colestipol (COLESTID) 1 g tablet Take 1 g by mouth See admin instructions. 1 tablet (1g) prior to dialysis M-W-F + 1 tablet daily PRN diarrhea    [provider]  diclofenac sodium (VOLTAREN) 1 % GEL Apply 2 g topically 4 (four) times daily as needed (for hand pain).     [provider]  insulin aspart (NOVOLOG FLEXPEN) 100 UNIT/ML FlexPen Inject 10 Units into the skin 3 (three) times daily as needed (CBG > 200).    [provider]  lidocaine-prilocaine (EMLA) cream Apply 1 Application topically every Monday, Wednesday, and Friday with hemodialysis.    [provider]  lipase/protease/amylase (CREON) 36000 UNITS CPEP capsule Take 1 capsule (36,000 Units total) by mouth with snacks. Patient taking differently: Take 36,000-72,000 Units by mouth See admin instructions. 2 capsules(72000 units) TID with meals AND 1 capsule(36000 units) with snacks 08/30/21 11/28/21  Vashti Hey, MD  midodrine (PROAMATINE) 10 MG  tablet Take 10 mg by mouth as directed. 09/27/21   [provider]  polyethylene glycol powder (MIRALAX) 17 GM/SCOOP powder Take 17 g by mouth 2 (two) times daily as needed for moderate constipation. 11/04/21   Mercy Riding, MD  senna-docusate (SENOKOT-S) 8.6-50 MG tablet Take 1 tablet by mouth 2 (two) times daily between meals as needed for mild constipation. 11/04/21   Mercy Riding, MD  sevelamer carbonate (RENVELA) 800 MG tablet Take 1,600 mg by mouth 3 (three) times daily with meals.    [provider]  sodium chloride flush (NS) 0.9 % SOLN 5 mLs by Intracatheter route every 8 (eight) hours. 11/04/21   Mercy Riding, MD  XIIDRA 5 % SOLN Place 1 drop into both eyes at bedtime. 09/24/21   [provider]      Family History  Problem Relation Age of Onset   Heart disease Mother    Stroke Mother    Cancer Father        colon   Alzheimer's disease Sister    Alzheimer's disease Sister    Cancer Brother        brain   Breast cancer Neg Hx     Social History   Socioeconomic History   Marital status: Widowed    Spouse name: Not on file   Number of children: Not on file   Years of education: Not on file   Highest education level: Not on file  Occupational History   Not on file  Tobacco Use   Smoking status: Never   Smokeless tobacco: Never  Vaping Use   Vaping Use: Never used  Substance and Sexual Activity   Alcohol use: No   Drug use: No   Sexual activity: Not on file  Other Topics Concern   Not on file  Social History Narrative   Not on file   Social Determinants of Health   Financial Resource Strain: Not on file  Food Insecurity: No Food Insecurity (11/01/2021)   Hunger Vital Sign    Worried About Running Out of Food in the Last Year: Never true    Ran Out of Food in the Last Year: Never true  Transportation Needs: No Transportation Needs (11/01/2021)   PRAPARE - Hydrologist (Medical): No    Lack of Transportation (Non-Medical): No  Physical Activity: Not on file  Stress: Not on file  Social Connections: Not on file    ECOG Status: 1 - Symptomatic but completely ambulatory  Review of Systems: A 12 point ROS discussed and pertinent positives are indicated in the HPI above.  All other systems are negative.  Review of Systems  Vital Signs: BP (!) 171/64   Pulse 79   SpO2 99%   Physical Exam  Mallampati Score:     Imaging: CT ABDOMEN PELVIS WO CONTRAST  Result Date: 11/12/2021 CLINICAL DATA:  History of left-sided renal cell carcinoma, post percutaneous coil embolization and microwave ablation performed 10/01/4816 complicated by development of a delayed urinary leak requiring placement of a left-sided double-J ureteral  stent on 10/31/2021 as well as a left-sided perinephric drainage catheter on 11/02/2021. Patient presents today to the interventional radiology drain clinic for drainage catheter evaluation and management. While the output from the left-sided perinephric drainage catheter initially was scant and predominantly bloody, recently the output has increased and now resembles urine. Patient denies worsening pain, fever or chills. The patient is completing her prescribed course of intravenous antibiotics (administered following  her dialysis sessions). EXAM: CT ABDOMEN AND PELVIS WITHOUT CONTRAST TECHNIQUE: Multidetector CT imaging of the abdomen and pelvis was performed following the standard protocol without IV contrast. RADIATION DOSE REDUCTION: This exam was performed according to the departmental dose-optimization program which includes automated exposure control, adjustment of the mA and/or kV according to patient size and/or use of iterative reconstruction technique. COMPARISON:  CT abdomen pelvis-10/29/2021; CT-guided percutaneous catheter placement-11/02/2021 Intraoperative images during left-sided double-J ureteral stent placement-10/31/2021 FINDINGS: The lack of intravenous contrast limits the ability to evaluate solid abdominal organs. Lower chest: Limited visualization of the lower thorax redemonstrates a small mesenteric fat containing left-sided Bochdalek's hernia. Normal heart size. There is mild diffuse decreased attenuation of the intra cardiac blood pool suggestive of anemia. Coronary artery calcifications. No pericardial effusion. Hepatobiliary: Normal hepatic contour. Post cholecystectomy. No ascites. Pancreas: Sequela of previous Whipple procedure. Spleen: Normal noncontrast appearance of the spleen. Adrenals/Urinary Tract: Stable positioning of left-sided double-J ureteral stent with superior coil within the superior aspect of the left renal collecting system and inferior coil within the decompressed  urinary bladder. Stable positioning of left-sided perinephric drainage catheter with minimal amount residual stranding about the inferior aspect of the left kidney without new definable/drainable fluid collection. There remains high density material within the inferior pole of the left kidney of the sequela of percutaneous particle embolization. Redemonstrated multiple mixed attenuating renal lesions, incompletely evaluated on the present examination. Very mild residual pelvicaliectasis following left-sided double-J ureteral stent placement. No right-sided urinary obstruction. Stomach/Bowel: Sequela of previous Whipple procedural with associated gastrojejunostomy creation. Moderate-to-large colonic stool burden without evidence of enteric obstruction. Normal noncontrast appearance of the terminal ileum. The appendix is not visualized. No pneumoperitoneum, pneumatosis or portal venous gas. Vascular/Lymphatic: Atherosclerotic plaque within a tortuous but normal caliber abdominal aorta. No bulky retroperitoneal scratched at scattered retroperitoneal lymph nodes are numerous though individually not enlarged by size criteria, presumably reactive in etiology. No bulky retroperitoneal, mesenteric, pelvic or inguinal lymphadenopathy on this noncontrast examination. Reproductive: Post hysterectomy. No discrete adnexal lesions. Multiple phleboliths are seen with the lower pelvis bilaterally. Other: Mild diffuse body wall anasarca, most conspicuous about the midline of the low back. Redemonstrated midline mesenteric fat containing incisional hernias. Musculoskeletal: No acute or aggressive osseous abnormalities. Moderate to severe multilevel lumbar spine DDD, worse at L3-L4, L4-L5 and L5-S1 with disc space height loss, endplate irregularity and sclerosis. Mild-to-moderate degenerative change the bilateral hips with joint space loss, subchondral sclerosis, osteophytosis and subchondral geode/cyst formation,  right-greater-than-left. IMPRESSION: 1. Stable positioning of left-sided perinephric drainage catheter with minimal amount of residual stranding about the inferior pole the left kidney without new definable/drainable fluid collection. 2. Mild residual left-sided pelvicaliectasis following double-J ureteral stent placement. 3. Sequela of previous Whipple procedure without evidence of complication. 4. Coronary artery calcifications. Aortic Atherosclerosis (ICD10-I70.0). 5. Stable ancillary findings as above. PLAN: Patient subsequently underwent percutaneous drainage catheter injection. Electronically Signed   By: Sandi Mariscal M.D.   On: 11/12/2021 14:38   IR Radiologist Eval & Mgmt  Result Date: 11/12/2021 Please refer to notes tab for details about interventional procedure. (Op Note)  CT GUIDED PERITONEAL/RETROPERITONEAL FLUID DRAIN BY PERC CATH  Result Date: 11/02/2021 INDICATION: 86 year old female with perinephric fluid collection, presents for drainage. EXAM: CT-GUIDED DRAINAGE OF LEFT PERINEPHRIC FLUID TECHNIQUE: Multidetector CT imaging of the abdomen was performed following the standard protocol without IV contrast. RADIATION DOSE REDUCTION: This exam was performed according to the departmental dose-optimization program which includes automated exposure control, adjustment of the mA and/or  kV according to patient size and/or use of iterative reconstruction technique. MEDICATIONS: None ANESTHESIA/SEDATION: Moderate (conscious) sedation was employed during this procedure. A total of Versed 1.0 mg and Fentanyl 50 mcg was administered intravenously by the radiology nurse. Total intra-service moderate Sedation Time: 14 minutes. The patient's level of consciousness and vital signs were monitored continuously by radiology nursing throughout the procedure under my direct supervision. COMPLICATIONS: None PROCEDURE: Informed written consent was obtained from the patient after a thorough discussion of the  procedural risks, benefits and alternatives. All questions were addressed. Maximal Sterile Barrier Technique was utilized including caps, mask, sterile gowns, sterile gloves, sterile drape, hand hygiene and skin antiseptic. A timeout was performed prior to the initiation of the procedure. Patient position prone on the CT gantry table. Scout CT acquired for planning purposes. Using CT guidance, trocar needle was advanced into the fluid collection in the left perinephric space. Modified Seldinger technique was used to place a 10 [place] drain. Proximally 30 cc of complex/purulent sanguinous fluid aspirated. Culture was sent to the lab. Drain was attached to bulb suction and sutured in position. Final images were stored. Patient tolerated the procedure well and remained hemodynamically stable throughout. No complications were encountered and no significant blood loss. IMPRESSION: Status post CT-guided drainage of left perinephric fluid. Signed, Dulcy Fanny. Nadene Rubins, RPVI [facility] Electronically Signed   By: Corrie Mckusick D.O.   On: 11/02/2021 12:47   DG C-Arm 1-60 Min  Result Date: 10/31/2021 CLINICAL DATA:  Cystoscopy with retrograde pyelogram/ureteral stent placement. EXAM: DG C-ARM 1-60 MIN CONTRAST:  Intraoperative study.  Information not given. FLUOROSCOPY: Fluoroscopy Time:  2 minutes 2nd Radiation Exposure Index (if provided by the fluoroscopic device): 13.48 mGy Number of Acquired Spot Images: 0 COMPARISON:  CT 10/29/2021 FINDINGS: Multiple intraoperative spot images demonstrate decompressed right ureter. Severe right hydronephrosis. Final images demonstrate placement of left ureteral stent. IMPRESSION: Intraoperative imaging as above. Electronically Signed   By: Rolm Baptise M.D.   On: 10/31/2021 17:32   CT Renal Stone Study  Result Date: 10/29/2021 CLINICAL DATA:  Previous renal mass with fever EXAM: CT ABDOMEN AND PELVIS WITHOUT  CONTRAST TECHNIQUE: Multidetector CT imaging of the abdomen and pelvis was performed following the standard protocol without IV contrast. RADIATION DOSE REDUCTION: This exam was performed according to the departmental dose-optimization program which includes automated exposure control, adjustment of the mA and/or kV according to patient size and/or use of iterative reconstruction technique. COMPARISON:  CT 09/06/2021, 08/25/2021, 08/22/2021, 05/03/2021 FINDINGS: Lower chest: Lung bases demonstrate no consolidation or effusion. Minimal mosaic attenuation which could be due to small airways disease. Coronary vascular calcification. Trace air in the anterior right atrium is presumably from line placement. Hepatobiliary: No focal liver abnormality is seen. Status post cholecystectomy. No biliary dilatation. Pancreas: Status post previous Whipple procedure. Atrophy of the distal pancreas without inflammation. Spleen: Normal in size without focal abnormality. Adrenals/Urinary Tract: Adrenal glands are within normal limits. Suspected cortical thinning within the bilateral kidneys. Numerous bilateral cysts and hyperdense renal cortical lesions, further assessment limited without contrast. Suspect that there is moderate severe left hydronephrosis. Embolization material in the left kidney slightly different orientation compared to prior, likely due to mass effect from large process in the lower pole. The patient's left renal mass measures approximately 3.1 cm, coronal series 6, image 46. Rounded area of heterogeneous density surrounding the lower pole renal lesion, there is large fluid component with remainder of mostly fat density, the  area measures approximately 7.9 x 7.6 cm on coronal series 6 image 45; this measures 10.3 cm AP on series 3, image 40. It appears more organized compared to prior CT. The urinary bladder is unremarkable. Stomach/Bowel: The stomach shows postsurgical changes and moderate debris. There is no  dilated small bowel. No acute bowel wall thickening. Vascular/Lymphatic: Advanced aortic atherosclerosis. No aneurysm. Subcentimeter retroperitoneal lymph nodes. Reproductive: Status post hysterectomy. No adnexal masses. Other: Negative for pelvic effusion or free air. Numerous phleboliths and pelvic calcifications. Small fat containing ventral hernias. Musculoskeletal: No acute osseous abnormality. IMPRESSION: 1. Patient is status post ablation of mass in the lower pole of the left kidney. Interim organizing heterogenous mass at the lower pole of the left kidney surrounding the renal mass, this demonstrates mostly fat and fluid density and is presumably related to evolving ablation changes. There is no evidence for acute hematoma or active extravasation. There is however interim development of suspected moderate severe hydronephrosis of the left kidney with probable obstruction of the proximal ureter by the changes occurring in the lower pole of the left kidney. 2. History of prior Whipple procedure. No adverse features related to this finding allowing for limited exam due to absence of contrast. 3. Cardiomegaly Electronically Signed   By: Donavan Foil M.D.   On: 10/29/2021 20:31   DG Chest 2 View  Result Date: 10/29/2021 CLINICAL DATA:  Suspected sepsis EXAM: CHEST - 2 VIEW COMPARISON:  09/26/2021, 09/05/2021 FINDINGS: Low lung volumes. No pleural effusion or pneumothorax. No focal consolidation. Borderline cardiac size. Aortic atherosclerosis. Coils in the left upper quadrant. IMPRESSION: No active cardiopulmonary disease.  Hypoventilatory change. Electronically Signed   By: Donavan Foil M.D.   On: 10/29/2021 17:33    Labs:  CBC: Recent Labs    10/30/21 0226 10/31/21 0748 10/31/21 1513 11/01/21 0630 11/02/21 0558  WBC 11.5* 11.7*  --  8.4 8.5  HGB 8.0* 7.7* 9.9* 7.8* 8.0*  HCT 26.3* 25.5* 29.0* 25.2* 25.4*  PLT 326 331  --  337 356    COAGS: Recent Labs    08/22/21 0740 09/05/21 2115  10/29/21 1709 11/01/21 0630  INR 0.9 1.3* 1.4* 1.2    BMP: Recent Labs    10/30/21 0226 10/31/21 0748 10/31/21 1513 11/01/21 0630 11/02/21 0558  NA 139 137 136 137 137  K 4.2 4.5 4.5 4.4 4.5  CL 99 100 96* 99 95*  CO2 27 24  --  27 24  GLUCOSE 271* 172* 130* 136* 172*  BUN 29* 46* 18 25* 45*  CALCIUM 8.7* 8.9  --  8.6* 8.7*  CREATININE 4.11* 5.41* 2.70* 4.29* 5.77*  GFRNONAA 10* 7*  --  10* 7*    LIVER FUNCTION TESTS: Recent Labs    09/19/21 1825 10/25/21 1056 10/29/21 1709 10/30/21 0226 10/31/21 0748 11/01/21 0630 11/02/21 0558  BILITOT 0.6 0.3 0.6 0.7  --   --   --   AST 34 '15 26 15  '$ --   --   --   ALT 42 '24 19 16  '$ --   --   --   ALKPHOS 89 98 67 62  --   --   --   PROT 6.4* 6.7 6.6 6.3*  --   --   --   ALBUMIN 2.7* 3.5* 2.3* 2.2* 2.0* 2.1* 2.2*    TUMOR MARKERS: No results for input(s): "AFPTM", "CEA", "CA199", "CHROMGRNA" in the last 8760 hours.  Assessment and Plan:  Kim Burns is a 86 y.o. female  with complex past medical history significant for stroke, hypertension, hyperparathyroidism, diabetes, previous Whipple procedure (performed for benign etiology) and end-stage renal disease, on dialysis who underwent percutaneous coil embolization and microwave ablation of left-sided renal cell carcinoma on 14/97/0263 complicated by development of a delayed urinary leak requiring placement of a left-sided double-J ureteral stent on 10/31/2021 as well as a left-sided perinephric drainage catheter on 11/02/2021.    The patient presents today to the interventional radiology drain clinic for drainage catheter evaluation and management.    CT scan of the abdomen and pelvis performed earlier today demonstrates stable positioning of left-sided perinephric drainage catheter with minimal amount of residual stranding about the inferior pole the left kidney without new definable/drainable fluid collection.  Mild residual left-sided pelvicaliectasis following double-J  ureteral stent placement.   Subsequent fluoroscopic guided drain injection confirms a communication between the decompressed perinephric urinoma and an inferior pole left renal calyx.   PLAN:  - Above was discussed with patient's urologist, Dr. Louis Meckel, and the decision was made to convert the patient's perinephric drainage catheter from a JP bulb to a gravity bag.     - The patient was instructed to no longer flush the drainage catheter unless there is concern for catheter occlusion (such as excessive pericatheter leakage).    - The patient will maintain diligent records regarding daily drainage catheter output with hopes that the output will reduce in the coming days.  Additionally, the patient was encouraged to use the restroom on a regular basis to ensure maximal bladder decompression (though this is somewhat challenging given the patient's history of end-stage renal disease and overall poor urine output).    - The patient will return to the interventional radiology drain clinic on 11/22/2021 for repeat drainage catheter evaluation and management and drainage catheter injection (repeat CT scan is not necessary as there is no definable/drainable perinephric urinoma on today's exam).   A copy of this report was sent to the requesting provider on this date.  Electronically Signed: Sandi Mariscal 11/12/2021, 2:48 PM   I spent a total of 10 Minutes in face to face in clinical consultation, greater than 50% of which was counseling/coordinating care for drainage catheter evaluation and management

## 2021-11-14 DIAGNOSIS — Z992 Dependence on renal dialysis: Secondary | ICD-10-CM | POA: Diagnosis not present

## 2021-11-14 DIAGNOSIS — N2581 Secondary hyperparathyroidism of renal origin: Secondary | ICD-10-CM | POA: Diagnosis not present

## 2021-11-14 DIAGNOSIS — N186 End stage renal disease: Secondary | ICD-10-CM | POA: Diagnosis not present

## 2021-11-14 DIAGNOSIS — L299 Pruritus, unspecified: Secondary | ICD-10-CM | POA: Diagnosis not present

## 2021-11-15 ENCOUNTER — Other Ambulatory Visit: Payer: Medicare PPO

## 2021-11-16 DIAGNOSIS — L299 Pruritus, unspecified: Secondary | ICD-10-CM | POA: Diagnosis not present

## 2021-11-16 DIAGNOSIS — Z992 Dependence on renal dialysis: Secondary | ICD-10-CM | POA: Diagnosis not present

## 2021-11-16 DIAGNOSIS — N186 End stage renal disease: Secondary | ICD-10-CM | POA: Diagnosis not present

## 2021-11-16 DIAGNOSIS — N2581 Secondary hyperparathyroidism of renal origin: Secondary | ICD-10-CM | POA: Diagnosis not present

## 2021-11-17 DIAGNOSIS — I158 Other secondary hypertension: Secondary | ICD-10-CM | POA: Diagnosis not present

## 2021-11-17 DIAGNOSIS — N186 End stage renal disease: Secondary | ICD-10-CM | POA: Diagnosis not present

## 2021-11-17 DIAGNOSIS — Z992 Dependence on renal dialysis: Secondary | ICD-10-CM | POA: Diagnosis not present

## 2021-11-19 ENCOUNTER — Encounter: Payer: Self-pay | Admitting: Physician Assistant

## 2021-11-19 DIAGNOSIS — N2581 Secondary hyperparathyroidism of renal origin: Secondary | ICD-10-CM | POA: Diagnosis not present

## 2021-11-19 DIAGNOSIS — N186 End stage renal disease: Secondary | ICD-10-CM | POA: Diagnosis not present

## 2021-11-19 DIAGNOSIS — L299 Pruritus, unspecified: Secondary | ICD-10-CM | POA: Diagnosis not present

## 2021-11-19 DIAGNOSIS — Z992 Dependence on renal dialysis: Secondary | ICD-10-CM | POA: Diagnosis not present

## 2021-11-21 DIAGNOSIS — L299 Pruritus, unspecified: Secondary | ICD-10-CM | POA: Diagnosis not present

## 2021-11-21 DIAGNOSIS — N2581 Secondary hyperparathyroidism of renal origin: Secondary | ICD-10-CM | POA: Diagnosis not present

## 2021-11-21 DIAGNOSIS — N186 End stage renal disease: Secondary | ICD-10-CM | POA: Diagnosis not present

## 2021-11-21 DIAGNOSIS — Z992 Dependence on renal dialysis: Secondary | ICD-10-CM | POA: Diagnosis not present

## 2021-11-21 NOTE — Progress Notes (Signed)
Cardiology Office Note   Date:  11/23/2021   ID:  Kim Burns, Kim Burns [birth year], MRN [id number]  PCP:  [name], MD  Cardiologist:   Londan Coplen [place], MD   Chief Complaint  Patient presents with   Follow-up   Atrial Fibrillation      History of Present Illness: Kim Burns is a 86 y.o. female who is seen at the request of Dr Ernie Hew.  She was seen remotely by me in 2014 for evaluation of bradycardia. She has a history of CKD and HTN. Echo in 2015 showed LVH with normal systolic function. She had a Whipple procedure in 2014 with pancreatectomy and partial bowel resection. She developed ESRD felt to be related to DM and HTN. She has been on dialysis since January this year. With dialysis she has developed orthostatic hypotension that is worse on dialysis days. Her antihypertensives have been discontinued and she is now on midodrine on dialysis days. No syncope. She also has bilateral carpel tunnel syndrome and surgery has been recommended. Seeing Dr Fredna Dow. She has a slow growing renal cancer on her left kidney and underwent ablation of this in July.  She denies any chest pain or SOB. Does tend to bleed a lot from her vascular access site with dialysis.   In July she developed  new onset atrial fibrillation with RVR during hospitalization in early July 2023 after an ablation procedure for left renal cell carcinoma. She was started on rate control agents as well as Eliquis for stroke prophylaxis.  She had spontaneously converted back to sinus rhythm prior to discharge after receiving IV Cardizem. CTA of abdomen and chest did not show any evidence of PE, 3 mm left pulmonary nodule noted, stable solid mass in the medial aspect of the left kidney.  Diagnostic cardiac catheterization on 7/10 due to elevated troponins on 7/8 revealed nonobstructive disease with 20% proximal RCA, 20% left circumflex artery, 20% mid left main, 30% ostial LAD, 50% proximal LAD, EF 55 to 65%.  Repeat echo  obtained on the same day revealed LVEF of 60 to 65%, moderately reduced RV systolic function, grade 3 diastolic dysfunction, RSVP 46.1 mmHg, severe biatrial enlargement, mild MR, mild to moderate TR.    She was admitted 9/11-9/17/23 with acute sepsis. CT abdomen and pelvis in ED showed interval development of post ablation changes to left kidney with additional interval development of severe hydronephrosis with obstruction of the proximal ureter.  She also had lactic acidosis and leukocytosis.  Cultures obtained.  She was started on vancomycin and Zosyn. Blood culture with Streptococcus gallolyticus in 1 out of 2 bottles.  Patient had left ureteral stent placement by urology on 9/13, and drain placed for left perinephric abscess by IR on 9/15.   Since DC she has continued to feel weak. She is sleeping a lot during the day but not at night. She has not experienced hypotension on dialysis. Is only taking Coreg on nondialysis days. Is concerned about dosing of Eliquis. Gets SOB easy when going up steps. Has chronic anemia with Hgb 8.   Past Medical History:  Diagnosis Date   Anemia of chronic disease    takes iron   Anxiety    Blood transfusion without reported diagnosis    Bradycardia    Diabetes mellitus without complication ([place])    became diabetic after Whipple procedure   Diarrhea    Dysrhythmia 04/16/2016   bradycardia due to medication    ESRD on hemodialysis ([place])  M-W-F   GERD (gastroesophageal reflux disease)    Gout    Headache    History of kidney stones 06/2013   Hyperparathyroidism ([place])    Hypertension    PONV (postoperative nausea and vomiting)    Sleep apnea    no cpap machine. could not tolerate   Stroke ([place]) 04/16/2016   TIA 1995   Vitamin D deficiency     Past Surgical History:  Procedure Laterality Date   A/V FISTULAGRAM Left 08/30/2021   Procedure: A/V Fistulagram;  Surgeon: Marty Heck, MD;  Location: [facility];  Service: Cardiovascular;   Laterality: Left;   ABDOMINAL HYSTERECTOMY  1985   complete   BACK SURGERY  7408   lower   BASCILIC VEIN TRANSPOSITION Left 04/24/2017   Procedure: LEFT ARM FIRST STAGE BASILIC VEIN TRANSPOSITION;  Surgeon: Serafina Mitchell, MD;  Location: [facility];  Service: Vascular;  Laterality: Left;   [place] Left 07/10/2017   Procedure: SECOND STAGE BASILIC VEIN TRANSPOSITION LEFT ARM;  Surgeon: Serafina Mitchell, MD;  Location: [facility];  Service: Vascular;  Laterality: Left;   BREAST LUMPECTOMY Left x 2   many years apart, benign   CHOLECYSTECTOMY  1985   CYSTOSCOPY W/ URETERAL STENT PLACEMENT Left 10/31/2021   Procedure: CYSTOSCOPY WITH RETROGRADE PYELOGRAM/URETERAL STENT PLACEMENT;  Surgeon: Ardis Hughs, MD;  Location: [facility];  Service: Urology;  Laterality: Left;   CYSTOSCOPY WITH URETEROSCOPY AND STENT PLACEMENT Left 06/18/2013   Procedure: CYSTOSCOPY WITH Lef URETEROSCOPY AND Left STENT PLACEMENT;  Surgeon: Dutch Gray, MD;  Location: [facility];  Service: Urology;  Laterality: Left;   ESOPHAGOGASTRODUODENOSCOPY (EGD) WITH PROPOFOL N/A 11/13/2016   Procedure: ESOPHAGOGASTRODUODENOSCOPY (EGD) WITH PROPOFOL;  Surgeon: Otis Brace, MD;  Location: [facility];  Service: Gastroenterology;  Laterality: N/A;   EUS N/A 02/07/2016   Procedure: ESOPHAGEAL ENDOSCOPIC ULTRASOUND (EUS) RADIAL;  Surgeon: Arta Silence, MD;  Location: [facility];  Service: Endoscopy;  Laterality: N/A;   EYE SURGERY Bilateral 2014   ioc for catracts    FOOT SURGERY Left 1990   something with toes unsure what    HERNIA REPAIR  2018   IR EMBO TUMOR ORGAN ISCHEMIA INFARCT INC GUIDE ROADMAPPING  08/22/2021   IR RADIOLOGIST EVAL & MGMT  05/18/2021   IR RADIOLOGIST EVAL & MGMT  10/01/2021   IR RADIOLOGIST EVAL & MGMT  11/12/2021   IR RADIOLOGIST EVAL & MGMT  11/22/2021   IR RENAL SUPRASEL UNI S&I MOD SED  08/22/2021   IR US GUIDE VASC ACCESS RIGHT  08/22/2021   LEFT HEART CATH AND CORONARY ANGIOGRAPHY N/A  08/27/2021   Procedure: LEFT HEART CATH AND CORONARY ANGIOGRAPHY;  Surgeon: Burnell Blanks, MD;  Location: [facility];  Service: Cardiovascular;  Laterality: N/A;   PARATHYROID EXPLORATION     PERIPHERAL VASCULAR BALLOON ANGIOPLASTY Left 08/30/2021   Procedure: PERIPHERAL VASCULAR BALLOON ANGIOPLASTY;  Surgeon: Marty Heck, MD;  Location: [facility];  Service: Cardiovascular;  Laterality: Left;  arm fistula   RADIOLOGY WITH ANESTHESIA Left 08/22/2021   Procedure: MICROWAVE ABLATION;  Surgeon: Sandi Mariscal, MD;  Location: [facility];  Service: Anesthesiology;  Laterality: Left;   WHIPPLE PROCEDURE N/A 04/23/2016   Procedure: WHIPPLE PROCEDURE;  Surgeon: Stark Klein, MD;  Location: [facility];  Service: General;  Laterality: N/A;     Current Outpatient Medications  Medication Sig Dispense Refill   acetaminophen (TYLENOL 8 HOUR ARTHRITIS PAIN) 650 MG CR tablet Take 1,300 mg  by mouth daily as needed for pain.     apixaban (ELIQUIS) 2.5 MG TABS tablet Take 1 tablet (2.5 mg total) by mouth 2 (two) times daily. 60 tablet 5   carvedilol (COREG) 3.125 MG tablet Take 1 tablet (3.125 mg total) by mouth 2 (two) times daily with a meal. Take on the days you do not have dialysis 60 tablet 1   Cholecalciferol (VITAMIN D-3 PO) Take 1 capsule by mouth every Monday, Wednesday, and Friday with hemodialysis.     colestipol (COLESTID) 1 g tablet Take 1 g by mouth See admin instructions. 1 tablet (1g) prior to dialysis M-W-F + 1 tablet daily PRN diarrhea     insulin aspart (NOVOLOG FLEXPEN) 100 UNIT/ML FlexPen Inject 10 Units into the skin 3 (three) times daily as needed (CBG > 200).     lidocaine-prilocaine (EMLA) cream Apply 1 Application topically every Monday, Wednesday, and Friday with hemodialysis.     lipase/protease/amylase (CREON) 36000 UNITS CPEP capsule Take 1 capsule (36,000 Units total) by mouth with snacks. (Patient taking differently: Take 36,000-72,000 Units by mouth See admin  instructions. 2 capsules(72000 units) TID with meals AND 1 capsule(36000 units) with snacks) 270 capsule 0   midodrine (PROAMATINE) 10 MG tablet Take 10 mg by mouth as directed.     sevelamer carbonate (RENVELA) 800 MG tablet Take 1,600 mg by mouth 3 (three) times daily with meals.     amiodarone (PACERONE) 200 MG tablet Take 0.5 tablets (100 mg total) by mouth daily. Please keep appointment for further refills 90 tablet 3   diclofenac sodium (VOLTAREN) 1 % GEL Apply 2 g topically 4 (four) times daily as needed (for hand pain).  (Patient not taking: Reported on 11/23/2021)     polyethylene glycol powder (MIRALAX) 17 GM/SCOOP powder Take 17 g by mouth 2 (two) times daily as needed for moderate constipation. (Patient not taking: Reported on 11/23/2021) 255 g 0   RESTASIS 0.05 % ophthalmic emulsion 1 drop 2 (two) times daily.     senna-docusate (SENOKOT-S) 8.6-50 MG tablet Take 1 tablet by mouth 2 (two) times daily between meals as needed for mild constipation. (Patient not taking: Reported on 11/23/2021) 180 tablet 0   sodium chloride flush (NS) 0.9 % SOLN 5 mLs by Intracatheter route every 8 (eight) hours. (Patient not taking: Reported on 11/23/2021)     XIIDRA 5 % SOLN Place 1 drop into both eyes at bedtime. (Patient not taking: Reported on 11/23/2021)     No current facility-administered medications for this visit.    Allergies:   Lopid [gemfibrozil], Lotemax [loteprednol etabonate], Statins, Norco [hydrocodone-acetaminophen], Other, Tomato, Vibra-tab [doxycycline], Codeine, and Hydrocodone-acetaminophen    Social History:  The patient  reports that she has never smoked. She has never used smokeless tobacco. She reports that she does not drink alcohol and does not use drugs.   Family History:  The patient's family history includes Alzheimer's disease in her sister and sister; Cancer in her brother and father; Heart disease in her mother; Stroke in her mother.    ROS:  Please see the history of  present illness.   Otherwise, review of systems are positive for none.   All other systems are reviewed and negative.    PHYSICAL EXAM: VS:  BP (!) 144/68 (BP Location: Right Arm, Patient Position: Sitting, Cuff Size: Normal)   Pulse 78   Ht '5\' 2"'$  (1.575 m)   Wt 129 lb 9.6 oz (58.8 kg)   SpO2 98%   BMI 23.70  kg/m  , BMI Body mass index is 23.7 kg/m. GEN: chronically ill appearing, in no acute distress.  HEENT: normal Neck: no JVD, carotid bruits, or masses Cardiac: RRR; radiated murmur from her AV fistula. no murmurs, rubs, or gallops,no edema. Functioning AV fistula in left upper arm. Respiratory:  clear to auscultation bilaterally, normal work of breathing GI: soft, nontender, nondistended, + BS MS: no deformity or atrophy Skin: warm and dry, no rash Neuro:  Strength and sensation are intact Psych: euthymic mood, full affect   EKG:  EKG is not ordered today.   Recent Labs: 09/05/2021: B Natriuretic Peptide 2,213.9 10/25/2021: TSH 1.480 10/30/2021: ALT 16 11/02/2021: BUN 45; Creatinine, Ser 5.77; Hemoglobin 8.0; Magnesium 2.2; Platelets 356; Potassium 4.5; Sodium 137    Lipid Panel    Component Value Date/Time   CHOL 95 08/26/2021 0447   CHOL 133 08/09/2021 1143   TRIG 110 08/26/2021 0447   HDL 35 (L) 08/26/2021 0447   HDL 43 08/09/2021 1143   CHOLHDL 2.7 08/26/2021 0447   VLDL 22 08/26/2021 0447   LDLCALC 38 08/26/2021 0447   LDLCALC 68 08/09/2021 1143      Wt Readings from Last 3 Encounters:  11/23/21 129 lb 9.6 oz (58.8 kg)  11/07/21 129 lb (58.5 kg)  11/02/21 129 lb 10.1 oz (58.8 kg)      Other studies Reviewed: Additional studies/ records that were reviewed today include:   Echo 06/19/13: Study Conclusions   - Left ventricle: The cavity size was normal. There was    severe focal basal and moderate concentric hypertrophy.    Systolic function was normal. The estimated ejection    fraction was in the range of 60% to 65%. Wall motion was    normal; there  were no regional wall motion abnormalities.  - Aortic valve: Valve area: 1.69cm^2 (Vmax).  - Mitral valve: Mild to moderate regurgitation.  - Left atrium: The atrium was mildly dilated.  - Atrial septum: There was increased thickness of the    septum, consistent with lipomatous hypertrophy.   Echo 08/27/21: IMPRESSIONS     1. Right ventricular systolic function is moderately reduced. The right  ventricular size is mildly enlarged. There is moderately elevated  pulmonary artery systolic pressure. The estimated right ventricular  systolic pressure is 32.9 mmHg. Normal apical  function with free wall hypokinesis, consistent with McConnell's sign as  can be seen in acute PE (image 58). Consider evaluation for PE.   2. Left ventricular ejection fraction, by estimation, is 60 to 65%. The  left ventricle has normal function. The left ventricle has no regional  wall motion abnormalities. Left ventricular diastolic parameters are  consistent with Grade III diastolic  dysfunction (restrictive). Elevated left atrial pressure.   3. Left atrial size was severely dilated.   4. Right atrial size was severely dilated.   5. The mitral valve is degenerative. Mild mitral valve regurgitation. No  evidence of mitral stenosis. Moderate mitral annular calcification.   6. Tricuspid valve regurgitation is mild to moderate.   7. The aortic valve is tricuspid. Aortic valve regurgitation is not  visualized. Aortic valve sclerosis is present, with no evidence of aortic  valve stenosis.   8. The inferior vena cava is dilated in size with <50% respiratory  variability, suggesting right atrial pressure of 15 mmHg.   Cardiac cath 08/27/21:  LEFT HEART CATH AND CORONARY ANGIOGRAPHY   Conclusion      Prox RCA lesion is 20% stenosed.   Mid LM  to Dist LM lesion is 20% stenosed.   Ost LAD lesion is 30% stenosed.   Mid Cx lesion is 20% stenosed.   Prox LAD lesion is 50% stenosed.   The left ventricular systolic  function is normal.   LV end diastolic pressure is normal.   The left ventricular ejection fraction is 55-65% by visual estimate.   There is no mitral valve regurgitation.   Mild non-obstructive CAD Normal LV systolic function Elevated troponin and chest pain is likely due to demand ischemic in the setting of rapid atrial fibrillation.    Recommendation: Medical management of CAD.   Coronary Diagrams  Diagnostic Dominance: Right  Interve  ASSESSMENT AND PLAN:  1.  ESRD on dialysis. Volume status appears OK 2. Orthostatic hypotension. This has improved and reviewed records of BP reading from home. No longer on midodrine. On low dose Coreg on nondialysis days. Will continue to monitor. If she does develop hypotension my need to stop Coreg but she is tolerating well now.   3. Bilateral carpel tunnel syndrome 4. DM 5. History of HTN 6. Renal tumor s/p ablation 7. Recent sepsis with hydronephrosis and abscess. S/p drain.  8. Nonobstructive CAD 9. Paroxysmal Afib following ablation procedure. Converted on amiodarone. Surprisingly did not have Afib during recent hospitalization despite high stress. Will reduce dose of amiodarone to 100 mg daily. If no recurrent afib in several months may be able to discontinue. [place] Vasc score is 7. Would favor continuing Eliquis. Will check with our pharm D to make sure she is on appropriate dose.        Disposition:   FU with me 3 months   Signed, Jazira Maloney [place], MD  11/23/2021 2:47 PM    [facility] [address] [place], [place], [address] Phone [phone], Fax [phone]

## 2021-11-22 ENCOUNTER — Other Ambulatory Visit: Payer: Self-pay | Admitting: Interventional Radiology

## 2021-11-22 ENCOUNTER — Ambulatory Visit
Admission: RE | Admit: 2021-11-22 | Discharge: 2021-11-22 | Disposition: A | Discharge status: Home / Self Care | Payer: Medicare PPO | Source: Ambulatory Visit | Attending: Urology | Admitting: Urology

## 2021-11-22 ENCOUNTER — Other Ambulatory Visit: Payer: Self-pay | Admitting: Urology

## 2021-11-22 DIAGNOSIS — Z4682 Encounter for fitting and adjustment of non-vascular catheter: Secondary | ICD-10-CM | POA: Diagnosis not present

## 2021-11-22 DIAGNOSIS — N2889 Other specified disorders of kidney and ureter: Secondary | ICD-10-CM

## 2021-11-22 HISTORY — PX: IR RADIOLOGIST EVAL & MGMT: IMG5224

## 2021-11-22 NOTE — Progress Notes (Signed)
Patient ID: Kim Burns, female   DOB: Apr 13, 1935, 86 y.o.   MRN: [id number]        Chief Complaint: Urine leak following renal embolization and ablation  Referring Physician(s): Herrick,Benjamin W  History of Present Illness:  Kim Burns is a 86 y.o. female with complex past medical history significant for stroke, hypertension, hyperparathyroidism, diabetes, previous Whipple procedure (performed for benign etiology) and end-stage renal disease, on dialysis who underwent percutaneous coil embolization and microwave ablation of left-sided renal cell carcinoma on 40/11/2723 complicated by development of a delayed urinary leak requiring placement of a left-sided double-J ureteral stent on 10/31/2021 as well as a left-sided perinephric drainage catheter on 11/02/2021.   Drainage catheter injection performed on 11/12/2021 demonstrated communication between the decompressed urinoma and the inferior pole calyx.  The patient returns interventional radiology drain clinic for drainage catheter evaluation and management.  She is once again accompanied by her son.  Patient reports intermittently bloody output from the left-sided perinephric drainage catheter with volumes of between 200 - 400 cc per day.  She is completed her prescribed course of intravenous antibiotics.  She denies worsening pain, fever or chills.    Past Medical History:  Diagnosis Date   Anemia of chronic disease    takes iron   Anxiety    Blood transfusion without reported diagnosis    Bradycardia    Diabetes mellitus without complication ([place])    became diabetic after Whipple procedure   Diarrhea    Dysrhythmia 04/16/2016   bradycardia due to medication    ESRD on hemodialysis [facility])    M-W-F   GERD (gastroesophageal reflux disease)    Gout    Headache    History of kidney stones 06/2013   Hyperparathyroidism ([place])    Hypertension    PONV (postoperative nausea and vomiting)    Sleep apnea    no cpap  machine. could not tolerate   Stroke ([place]) 04/16/2016   TIA 1995   Vitamin D deficiency     Past Surgical History:  Procedure Laterality Date   A/V FISTULAGRAM Left 08/30/2021   Procedure: A/V Fistulagram;  Surgeon: Marty Heck, MD;  Location: [place] [facility];  Service: Cardiovascular;  Laterality: Left;   ABDOMINAL HYSTERECTOMY  1985   complete   BACK SURGERY  3664   lower   BASCILIC VEIN TRANSPOSITION Left 04/24/2017   Procedure: LEFT ARM FIRST STAGE BASILIC VEIN TRANSPOSITION;  Surgeon: Serafina Mitchell, MD;  Location: [facility];  Service: Vascular;  Laterality: Left;   [place] Left 07/10/2017   Procedure: SECOND STAGE BASILIC VEIN TRANSPOSITION LEFT ARM;  Surgeon: Serafina Mitchell, MD;  Location: [facility];  Service: Vascular;  Laterality: Left;   BREAST LUMPECTOMY Left x 2   many years apart, benign   CHOLECYSTECTOMY  1985   CYSTOSCOPY W/ URETERAL STENT PLACEMENT Left 10/31/2021   Procedure: CYSTOSCOPY WITH RETROGRADE PYELOGRAM/URETERAL STENT PLACEMENT;  Surgeon: Ardis Hughs, MD;  Location: [place];  Service: Urology;  Laterality: Left;   CYSTOSCOPY WITH URETEROSCOPY AND STENT PLACEMENT Left 06/18/2013   Procedure: CYSTOSCOPY WITH Lef URETEROSCOPY AND Left STENT PLACEMENT;  Surgeon: Dutch Gray, MD;  Location: [facility];  Service: Urology;  Laterality: Left;   ESOPHAGOGASTRODUODENOSCOPY (EGD) WITH PROPOFOL N/A 11/13/2016   Procedure: ESOPHAGOGASTRODUODENOSCOPY (EGD) WITH PROPOFOL;  Surgeon: Otis Brace, MD;  Location: [place];  Service: Gastroenterology;  Laterality: N/A;   EUS N/A 02/07/2016   Procedure: ESOPHAGEAL ENDOSCOPIC ULTRASOUND (EUS) RADIAL;  Surgeon:  Arta Silence, MD;  Location: [facility];  Service: Endoscopy;  Laterality: N/A;   EYE SURGERY Bilateral 2014   ioc for catracts    FOOT SURGERY Left 1990   something with toes unsure what    HERNIA REPAIR  2018   IR EMBO TUMOR ORGAN ISCHEMIA INFARCT INC GUIDE ROADMAPPING  08/22/2021    IR RADIOLOGIST EVAL & MGMT  05/18/2021   IR RADIOLOGIST EVAL & MGMT  10/01/2021   IR RADIOLOGIST EVAL & MGMT  11/12/2021   IR RADIOLOGIST EVAL & MGMT  11/22/2021   IR RENAL SUPRASEL UNI S&I MOD SED  08/22/2021   IR US GUIDE VASC ACCESS RIGHT  08/22/2021   LEFT HEART CATH AND CORONARY ANGIOGRAPHY N/A 08/27/2021   Procedure: LEFT HEART CATH AND CORONARY ANGIOGRAPHY;  Surgeon: Burnell Blanks, MD;  Location: [place] [facility];  Service: Cardiovascular;  Laterality: N/A;   PARATHYROID EXPLORATION     PERIPHERAL VASCULAR BALLOON ANGIOPLASTY Left 08/30/2021   Procedure: PERIPHERAL VASCULAR BALLOON ANGIOPLASTY;  Surgeon: Marty Heck, MD;  Location: [place] [facility];  Service: Cardiovascular;  Laterality: Left;  arm fistula   RADIOLOGY WITH ANESTHESIA Left 08/22/2021   Procedure: MICROWAVE ABLATION;  Surgeon: Sandi Mariscal, MD;  [facility];  Service: Anesthesiology;  Laterality: Left;   WHIPPLE PROCEDURE N/A 04/23/2016   Procedure: WHIPPLE PROCEDURE;  Surgeon: Stark Klein, MD;  Location: [place];  Service: General;  Laterality: N/A;    Allergies: Lopid [gemfibrozil], Lotemax [loteprednol etabonate], Statins, Norco [hydrocodone-acetaminophen], Other, Tomato, Vibra-tab [doxycycline], Codeine, and Hydrocodone-acetaminophen  Medications: Prior to Admission medications   Medication Sig Start Date End Date Taking? Authorizing Provider  acetaminophen (TYLENOL 8 HOUR ARTHRITIS PAIN) 650 MG CR tablet Take 1,300 mg by mouth daily as needed for pain.    [provider]  amiodarone (PACERONE) 200 MG tablet Take 1 tablet (200 mg total) by mouth daily. Please keep appointment for further refills 10/01/21   [place], Peter M, MD  apixaban (ELIQUIS) 2.5 MG TABS tablet Take 1 tablet (2.5 mg total) by mouth 2 (two) times daily. 09/25/21 03/24/22  [place], Peter M, MD  carvedilol (COREG) 3.125 MG tablet Take 1 tablet (3.125 mg total) by mouth 2 (two) times daily with a meal. Take on the days you do  not have dialysis 11/04/21   Mercy Riding, MD  Cholecalciferol (VITAMIN D-3 PO) Take 1 capsule by mouth every Monday, Wednesday, and Friday with hemodialysis.    [provider]  colestipol (COLESTID) 1 g tablet Take 1 g by mouth See admin instructions. 1 tablet (1g) prior to dialysis M-W-F + 1 tablet daily PRN diarrhea    [provider]  diclofenac sodium (VOLTAREN) 1 % GEL Apply 2 g topically 4 (four) times daily as needed (for hand pain).     [provider]  insulin aspart (NOVOLOG FLEXPEN) 100 UNIT/ML FlexPen Inject 10 Units into the skin 3 (three) times daily as needed (CBG > 200).    [provider]  lidocaine-prilocaine (EMLA) cream Apply 1 Application topically every Monday, Wednesday, and Friday with hemodialysis.    [provider]  lipase/protease/amylase (CREON) 36000 UNITS CPEP capsule Take 1 capsule (36,000 Units total) by mouth with snacks. Patient taking differently: Take 36,000-72,000 Units by mouth See admin instructions. 2 capsules(72000 units) TID with meals AND 1 capsule(36000 units) with snacks 08/30/21 11/28/21  Bonnell Public Tublu, MD  midodrine (PROAMATINE) 10 MG tablet Take 10 mg by mouth as directed. 09/27/21   [provider]  polyethylene glycol powder (MIRALAX) 17 GM/SCOOP powder Take 17 g by mouth 2 (two) times daily as needed for moderate constipation. 11/04/21   Mercy Riding, MD  senna-docusate (SENOKOT-S) 8.6-50 MG tablet Take 1 tablet by mouth 2 (two) times daily between meals as needed for mild constipation. 11/04/21   Mercy Riding, MD  sevelamer carbonate (RENVELA) 800 MG tablet Take 1,600 mg by mouth 3 (three) times daily with meals.    [provider]  sodium chloride flush (NS) 0.9 % SOLN 5 mLs by Intracatheter route every 8 (eight) hours. 11/04/21   Mercy Riding, MD  XIIDRA 5 % SOLN Place 1 drop into both eyes at bedtime. 09/24/21   [provider]     Family History  Problem Relation  Age of Onset   Heart disease Mother    Stroke Mother    Cancer Father        colon   Alzheimer's disease Sister    Alzheimer's disease Sister    Cancer Brother        brain   Breast cancer Neg Hx     Social History   Socioeconomic History   Marital status: Widowed    Spouse name: Not on file   Number of children: Not on file   Years of education: Not on file   Highest education level: Not on file  Occupational History   Not on file  Tobacco Use   Smoking status: Never   Smokeless tobacco: Never  Vaping Use   Vaping Use: Never used  Substance and Sexual Activity   Alcohol use: No   Drug use: No   Sexual activity: Not on file  Other Topics Concern   Not on file  Social History Narrative   Not on file   Social Determinants of Health   Financial Resource Strain: Not on file  Food Insecurity: No Food Insecurity (11/01/2021)   Hunger Vital Sign    Worried About Running Out of Food in the Last Year: Never true    Ran Out of Food in the Last Year: Never true  Transportation Needs: No Transportation Needs (11/01/2021)   PRAPARE - Hydrologist (Medical): No    Lack of Transportation (Non-Medical): No  Physical Activity: Not on file  Stress: Not on file  Social Connections: Not on file    ECOG Status: 2 - Symptomatic, <50% confined to bed  Review of Systems: A 12 point ROS discussed and pertinent positives are indicated in the HPI above.  All other systems are negative.  Review of Systems  Vital Signs: BP (!) 167/74   Pulse (!) 104   Temp 98.7 F (37.1 C)   SpO2 98%    Physical Exam  Mallampati Score:     Imaging:  CT abdomen and pelvis-11/12/2021; 10/29/2021 CT-guided percutaneous drainage catheter placement-11/02/2021 Intraoperative images during left-sided double-J ureteral stent placement-10/31/2021  IR Radiologist Eval & Mgmt  Result Date: 11/22/2021 Please refer to notes tab for details about interventional  procedure. (Op Note)  DG Sinus/Fist Tube Chk-Non GI  Result Date: 11/12/2021 CLINICAL DATA:  History of left-sided renal cell carcinoma, post percutaneous coil embolization and microwave ablation performed 74/01/8785 complicated by development of a delayed urinary leak requiring placement of a left-sided double-J ureteral stent on 10/31/2021 as well as a left-sided perinephric drainage catheter on 11/02/2021. Patient presents today to the interventional radiology drain clinic for drainage catheter evaluation and management. While the output  from the left-sided perinephric drainage catheter initially was scant and predominantly bloody, recently the output has increased and now resembles urine. Patient denies worsening pain, fever or chills. She is completing her prescribed course of intravenous antibiotics (administered following her dialysis sessions). Preceding CT scan of the abdomen pelvis demonstrates stable positioning of left-sided perinephric drainage catheter with minimal amount of residual stranding about the inferior pole the left kidney without new definable/drainable fluid collection. Mild residual left-sided pelvicaliectasis following double-J ureteral stent placement. Patient presents now for fluoroscopic guided drain injection. EXAM: ABSCESS INJECTION COMPARISON:  CT abdomen and pelvis-earlier same day; 10/29/2021; CT-guided percutaneous catheter placement-11/02/2021; intraoperative images during left-sided double-J ureteral stent placement-10/31/2021 CONTRAST:  10 cc Omnipaque 350-administered via the existing percutaneous drain. FLUOROSCOPY: 1 minute, 6 seconds (19.1 mGy) TECHNIQUE: The patient was positioned prone on the fluoroscopy table. A preprocedural spot fluoroscopic image was obtained of the left flank and the existing percutaneous drainage catheter. Multiple spot fluoroscopic and radiographic images were obtained following the injection of a small amount of contrast via the existing  percutaneous drainage catheter. Images were reviewed and discussed with the patient and the patient's son. The drainage catheter was flushed with a small amount of saline and converted from a JP bulb to a gravity bag. A dressing was applied. The patient tolerated the procedure well without immediate postprocedural complication. FINDINGS: Preprocedural spot fluoroscopic image demonstrates stable positioning of left sided perinephric percutaneous drainage catheter with end coiled and locked adjacent to the expected location of the inferior pole of the left kidneys. Superior aspect of the left-sided double-J ureteral stent overlies expected location of the superior pole of the left renal collecting system. Embolization coils overlie the mid/inferior pole the left kidney Contrast injection demonstrates opacification of the decompressed perinephric fluid collection with communication between the perinephric fluid collection and a left inferior pole calyx (image 8). Images demonstrate passage of contrast both through and around the left-sided double-J ureteral stent to the level of the urinary bladder. IMPRESSION: Drain injection confirms a communication between the decompressed perinephric urinoma and an inferior pole left renal calyx. PLAN: Above was discussed with patient's urologist, Dr. Louis Meckel, and the decision was made to convert the patient's perinephric drainage catheter from a JP bulb to a gravity bag. The patient was instructed to no longer flush the drainage catheter unless there is concern for catheter occlusion (such as excessive pericatheter leakage). The patient will maintain diligent records regarding daily drainage catheter output with hopes that the output will reduce in the coming days. Additionally, the patient was encouraged to use the restroom on a regular basis to ensure maximal bladder decompression (though this is somewhat challenging given the patient's history of end-stage renal disease and  overall poor urine output). The patient will return to the interventional radiology drain clinic on 11/22/2021 for repeat drainage catheter evaluation and management and drainage catheter injection (repeat CT scan is not necessary as there is no definable/drainable perinephric urinoma on today's exam). Electronically Signed   By: Sandi Mariscal M.D.   On: 11/12/2021 14:47   CT ABDOMEN PELVIS WO CONTRAST  Result Date: 11/12/2021 CLINICAL DATA:  History of left-sided renal cell carcinoma, post percutaneous coil embolization and microwave ablation performed 82/95/6213 complicated by development of a delayed urinary leak requiring placement of a left-sided double-J ureteral stent on 10/31/2021 as well as a left-sided perinephric drainage catheter on 11/02/2021. Patient presents today to the interventional radiology drain clinic for drainage catheter evaluation and management. While the output from the  left-sided perinephric drainage catheter initially was scant and predominantly bloody, recently the output has increased and now resembles urine. Patient denies worsening pain, fever or chills. The patient is completing her prescribed course of intravenous antibiotics (administered following her dialysis sessions). EXAM: CT ABDOMEN AND PELVIS WITHOUT CONTRAST TECHNIQUE: Multidetector CT imaging of the abdomen and pelvis was performed following the standard protocol without IV contrast. RADIATION DOSE REDUCTION: This exam was performed according to the departmental dose-optimization program which includes automated exposure control, adjustment of the mA and/or kV according to patient size and/or use of iterative reconstruction technique. COMPARISON:  CT abdomen pelvis-10/29/2021; CT-guided percutaneous catheter placement-11/02/2021 Intraoperative images during left-sided double-J ureteral stent placement-10/31/2021 FINDINGS: The lack of intravenous contrast limits the ability to evaluate solid abdominal organs. Lower  chest: Limited visualization of the lower thorax redemonstrates a small mesenteric fat containing left-sided Bochdalek's hernia. Normal heart size. There is mild diffuse decreased attenuation of the intra cardiac blood pool suggestive of anemia. Coronary artery calcifications. No pericardial effusion. Hepatobiliary: Normal hepatic contour. Post cholecystectomy. No ascites. Pancreas: Sequela of previous Whipple procedure. Spleen: Normal noncontrast appearance of the spleen. Adrenals/Urinary Tract: Stable positioning of left-sided double-J ureteral stent with superior coil within the superior aspect of the left renal collecting system and inferior coil within the decompressed urinary bladder. Stable positioning of left-sided perinephric drainage catheter with minimal amount residual stranding about the inferior aspect of the left kidney without new definable/drainable fluid collection. There remains high density material within the inferior pole of the left kidney of the sequela of percutaneous particle embolization. Redemonstrated multiple mixed attenuating renal lesions, incompletely evaluated on the present examination. Very mild residual pelvicaliectasis following left-sided double-J ureteral stent placement. No right-sided urinary obstruction. Stomach/Bowel: Sequela of previous Whipple procedural with associated gastrojejunostomy creation. Moderate-to-large colonic stool burden without evidence of enteric obstruction. Normal noncontrast appearance of the terminal ileum. The appendix is not visualized. No pneumoperitoneum, pneumatosis or portal venous gas. Vascular/Lymphatic: Atherosclerotic plaque within a tortuous but normal caliber abdominal aorta. No bulky retroperitoneal scratched at scattered retroperitoneal lymph nodes are numerous though individually not enlarged by size criteria, presumably reactive in etiology. No bulky retroperitoneal, mesenteric, pelvic or inguinal lymphadenopathy on this noncontrast  examination. Reproductive: Post hysterectomy. No discrete adnexal lesions. Multiple phleboliths are seen with the lower pelvis bilaterally. Other: Mild diffuse body wall anasarca, most conspicuous about the midline of the low back. Redemonstrated midline mesenteric fat containing incisional hernias. Musculoskeletal: No acute or aggressive osseous abnormalities. Moderate to severe multilevel lumbar spine DDD, worse at L3-L4, L4-L5 and L5-S1 with disc space height loss, endplate irregularity and sclerosis. Mild-to-moderate degenerative change the bilateral hips with joint space loss, subchondral sclerosis, osteophytosis and subchondral geode/cyst formation, right-greater-than-left. IMPRESSION: 1. Stable positioning of left-sided perinephric drainage catheter with minimal amount of residual stranding about the inferior pole the left kidney without new definable/drainable fluid collection. 2. Mild residual left-sided pelvicaliectasis following double-J ureteral stent placement. 3. Sequela of previous Whipple procedure without evidence of complication. 4. Coronary artery calcifications. Aortic Atherosclerosis (ICD10-I70.0). 5. Stable ancillary findings as above. PLAN: Patient subsequently underwent percutaneous drainage catheter injection. Electronically Signed   By: Sandi Mariscal M.D.   On: 11/12/2021 14:38   IR Radiologist Eval & Mgmt  Result Date: 11/12/2021 Please refer to notes tab for details about interventional procedure. (Op Note)  CT GUIDED PERITONEAL/RETROPERITONEAL FLUID DRAIN BY PERC CATH  Result Date: 11/02/2021 INDICATION: 86 year old female with perinephric fluid collection, presents for drainage. EXAM: CT-GUIDED DRAINAGE OF LEFT PERINEPHRIC FLUID TECHNIQUE:  Multidetector CT imaging of the abdomen was performed following the standard protocol without IV contrast. RADIATION DOSE REDUCTION: This exam was performed according to the departmental dose-optimization program which includes automated  exposure control, adjustment of the mA and/or kV according to patient size and/or use of iterative reconstruction technique. MEDICATIONS: None ANESTHESIA/SEDATION: Moderate (conscious) sedation was employed during this procedure. A total of Versed 1.0 mg and Fentanyl 50 mcg was administered intravenously by the radiology nurse. Total intra-service moderate Sedation Time: 14 minutes. The patient's level of consciousness and vital signs were monitored continuously by radiology nursing throughout the procedure under my direct supervision. COMPLICATIONS: None PROCEDURE: Informed written consent was obtained from the patient after a thorough discussion of the procedural risks, benefits and alternatives. All questions were addressed. Maximal Sterile Barrier Technique was utilized including caps, mask, sterile gowns, sterile gloves, sterile drape, hand hygiene and skin antiseptic. A timeout was performed prior to the initiation of the procedure. Patient position prone on the CT gantry table. Scout CT acquired for planning purposes. Using CT guidance, trocar needle was advanced into the fluid collection in the left perinephric space. Modified Seldinger technique was used to place a 10 [place] drain. Proximally 30 cc of complex/purulent sanguinous fluid aspirated. Culture was sent to the lab. Drain was attached to bulb suction and sutured in position. Final images were stored. Patient tolerated the procedure well and remained hemodynamically stable throughout. No complications were encountered and no significant blood loss. IMPRESSION: Status post CT-guided drainage of left perinephric fluid. Signed, Dulcy Fanny. Nadene Rubins, RPVI [facility] Electronically Signed   By: Corrie Mckusick D.O.   On: 11/02/2021 12:47   DG C-Arm 1-60 Min  Result Date: 10/31/2021 CLINICAL DATA:  Cystoscopy with retrograde pyelogram/ureteral stent placement. EXAM: DG C-ARM 1-60 MIN  CONTRAST:  Intraoperative study.  Information not given. FLUOROSCOPY: Fluoroscopy Time:  2 minutes 2nd Radiation Exposure Index (if provided by the fluoroscopic device): 13.48 mGy Number of Acquired Spot Images: 0 COMPARISON:  CT 10/29/2021 FINDINGS: Multiple intraoperative spot images demonstrate decompressed right ureter. Severe right hydronephrosis. Final images demonstrate placement of left ureteral stent. IMPRESSION: Intraoperative imaging as above. Electronically Signed   By: Rolm Baptise M.D.   On: 10/31/2021 17:32   CT Renal Stone Study  Result Date: 10/29/2021 CLINICAL DATA:  Previous renal mass with fever EXAM: CT ABDOMEN AND PELVIS WITHOUT CONTRAST TECHNIQUE: Multidetector CT imaging of the abdomen and pelvis was performed following the standard protocol without IV contrast. RADIATION DOSE REDUCTION: This exam was performed according to the departmental dose-optimization program which includes automated exposure control, adjustment of the mA and/or kV according to patient size and/or use of iterative reconstruction technique. COMPARISON:  CT 09/06/2021, 08/25/2021, 08/22/2021, 05/03/2021 FINDINGS: Lower chest: Lung bases demonstrate no consolidation or effusion. Minimal mosaic attenuation which could be due to small airways disease. Coronary vascular calcification. Trace air in the anterior right atrium is presumably from line placement. Hepatobiliary: No focal liver abnormality is seen. Status post cholecystectomy. No biliary dilatation. Pancreas: Status post previous Whipple procedure. Atrophy of the distal pancreas without inflammation. Spleen: Normal in size without focal abnormality. Adrenals/Urinary Tract: Adrenal glands are within normal limits. Suspected cortical thinning within the bilateral kidneys. Numerous bilateral cysts and hyperdense renal cortical lesions, further assessment limited without contrast. Suspect that there is moderate severe left hydronephrosis. Embolization material in  the left kidney slightly different orientation compared to prior, likely due to mass effect from large process in the  lower pole. The patient's left renal mass measures approximately 3.1 cm, coronal series 6, image 46. Rounded area of heterogeneous density surrounding the lower pole renal lesion, there is large fluid component with remainder of mostly fat density, the area measures approximately 7.9 x 7.6 cm on coronal series 6 image 45; this measures 10.3 cm AP on series 3, image 40. It appears more organized compared to prior CT. The urinary bladder is unremarkable. Stomach/Bowel: The stomach shows postsurgical changes and moderate debris. There is no dilated small bowel. No acute bowel wall thickening. Vascular/Lymphatic: Advanced aortic atherosclerosis. No aneurysm. Subcentimeter retroperitoneal lymph nodes. Reproductive: Status post hysterectomy. No adnexal masses. Other: Negative for pelvic effusion or free air. Numerous phleboliths and pelvic calcifications. Small fat containing ventral hernias. Musculoskeletal: No acute osseous abnormality. IMPRESSION: 1. Patient is status post ablation of mass in the lower pole of the left kidney. Interim organizing heterogenous mass at the lower pole of the left kidney surrounding the renal mass, this demonstrates mostly fat and fluid density and is presumably related to evolving ablation changes. There is no evidence for acute hematoma or active extravasation. There is however interim development of suspected moderate severe hydronephrosis of the left kidney with probable obstruction of the proximal ureter by the changes occurring in the lower pole of the left kidney. 2. History of prior Whipple procedure. No adverse features related to this finding allowing for limited exam due to absence of contrast. 3. Cardiomegaly Electronically Signed   By: Donavan Foil M.D.   On: 10/29/2021 20:31   DG Chest 2 View  Result Date: 10/29/2021 CLINICAL DATA:  Suspected sepsis EXAM:  CHEST - 2 VIEW COMPARISON:  09/26/2021, 09/05/2021 FINDINGS: Low lung volumes. No pleural effusion or pneumothorax. No focal consolidation. Borderline cardiac size. Aortic atherosclerosis. Coils in the left upper quadrant. IMPRESSION: No active cardiopulmonary disease.  Hypoventilatory change. Electronically Signed   By: Donavan Foil M.D.   On: 10/29/2021 17:33    Labs:  CBC: Recent Labs    10/30/21 0226 10/31/21 0748 10/31/21 1513 11/01/21 0630 11/02/21 0558  WBC 11.5* 11.7*  --  8.4 8.5  HGB 8.0* 7.7* 9.9* 7.8* 8.0*  HCT 26.3* 25.5* 29.0* 25.2* 25.4*  PLT 326 331  --  337 356    COAGS: Recent Labs    08/22/21 0740 09/05/21 2115 10/29/21 1709 11/01/21 0630  INR 0.9 1.3* 1.4* 1.2    BMP: Recent Labs    10/30/21 0226 10/31/21 0748 10/31/21 1513 11/01/21 0630 11/02/21 0558  NA 139 137 136 137 137  K 4.2 4.5 4.5 4.4 4.5  CL 99 100 96* 99 95*  CO2 27 24  --  27 24  GLUCOSE 271* 172* 130* 136* 172*  BUN 29* 46* 18 25* 45*  CALCIUM 8.7* 8.9  --  8.6* 8.7*  CREATININE 4.11* 5.41* 2.70* 4.29* 5.77*  GFRNONAA 10* 7*  --  10* 7*    LIVER FUNCTION TESTS: Recent Labs    09/19/21 1825 10/25/21 1056 10/29/21 1709 10/30/21 0226 10/31/21 0748 11/01/21 0630 11/02/21 0558  BILITOT 0.6 0.3 0.6 0.7  --   --   --   AST 34 '15 26 15  '$ --   --   --   ALT 42 '24 19 16  '$ --   --   --   ALKPHOS 89 98 67 62  --   --   --   PROT 6.4* 6.7 6.6 6.3*  --   --   --   ALBUMIN  2.7* 3.5* 2.3* 2.2* 2.0* 2.1* 2.2*    TUMOR MARKERS: No results for input(s): "AFPTM", "CEA", "CA199", "CHROMGRNA" in the last 8760 hours.  Assessment and Plan:  Kim Burns is a 86 y.o. female with complex past medical history significant for stroke, hypertension, hyperparathyroidism, diabetes, previous Whipple procedure (performed for benign etiology) and end-stage renal disease, on dialysis who underwent percutaneous coil embolization and microwave ablation of left-sided renal cell carcinoma on  35/32/9924 complicated by development of a delayed urinary leak requiring placement of a left-sided double-J ureteral stent on 10/31/2021 as well as a left-sided perinephric drainage catheter on 11/02/2021.   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  I explained to the patient and the patient's son that this process may be lengthy and ultimately the patient may require a chronic percutaneous drainage catheter.  Given patient's advanced age and multiple medical comorbidities, the current plan of care is to continue her routine care of the drainage catheter.  She was instructed to call the interventional radiology clinic if she were to experience 2 or 3 days of no output from the drainage catheter as this could indicate the urine leak has healed.  Otherwise, the patient will return to the interventional radiology drain clinic on 11/2 for drainage catheter injection, evaluation and management.  Ultimately, a percutaneous procedure may be attempted to try to seal the urine leak however this will be dependent on the patient's wishes and her tolerance of the drainage catheter.  The patient and the patient's son demonstrated excellent understanding of the above conversation and agreement of the proposed plan of care.  Plan: - Return to the interventional radiology drain clinic for drainage catheter injection, evaluation and management on 12/20/2021. - The patient and the patient's son know to call the interventional radiology clinic if they are to experience 2 to 3 days of no output from the drainage catheter as this could indicate the urine leak has healed.  Above findings discussed with Dr. Louis Meckel following today's consultation.  A copy of this report was sent to the requesting provider on this date.  Electronically Signed: Sandi Mariscal 11/22/2021, 2:08  PM   I spent a total of 15 Minutes in face to face in clinical consultation, greater than 50% of which was counseling/coordinating care for urinoma drain evaluation and management.

## 2021-11-23 ENCOUNTER — Encounter: Payer: Self-pay | Admitting: Cardiology

## 2021-11-23 ENCOUNTER — Ambulatory Visit: Payer: Medicare PPO | Attending: Cardiology | Admitting: Cardiology

## 2021-11-23 VITALS — BP 144/68 | HR 78 | Ht 62.0 in | Wt 129.6 lb

## 2021-11-23 DIAGNOSIS — D631 Anemia in chronic kidney disease: Secondary | ICD-10-CM | POA: Diagnosis not present

## 2021-11-23 DIAGNOSIS — Z7901 Long term (current) use of anticoagulants: Secondary | ICD-10-CM

## 2021-11-23 DIAGNOSIS — Z992 Dependence on renal dialysis: Secondary | ICD-10-CM | POA: Diagnosis not present

## 2021-11-23 DIAGNOSIS — N186 End stage renal disease: Secondary | ICD-10-CM

## 2021-11-23 DIAGNOSIS — N2581 Secondary hyperparathyroidism of renal origin: Secondary | ICD-10-CM | POA: Diagnosis not present

## 2021-11-23 DIAGNOSIS — I480 Paroxysmal atrial fibrillation: Secondary | ICD-10-CM | POA: Diagnosis not present

## 2021-11-23 DIAGNOSIS — L299 Pruritus, unspecified: Secondary | ICD-10-CM | POA: Diagnosis not present

## 2021-11-23 DIAGNOSIS — I951 Orthostatic hypotension: Secondary | ICD-10-CM | POA: Diagnosis not present

## 2021-11-23 MED ORDER — AMIODARONE HCL 200 MG PO TABS: 100.0000 mg | ORAL_TABLET | Freq: Every day | ORAL | 3 refills | Status: DC

## 2021-11-23 NOTE — Patient Instructions (Signed)
Reduce amiodarone to 100 mg daily  Continue Coreg on nondialysis days  We will check with our Pharm D about the appropriate dose of her Eliquis

## 2021-11-26 ENCOUNTER — Other Ambulatory Visit: Payer: Self-pay | Admitting: Interventional Radiology

## 2021-11-26 ENCOUNTER — Telehealth: Payer: Self-pay | Admitting: Cardiology

## 2021-11-26 DIAGNOSIS — N2889 Other specified disorders of kidney and ureter: Secondary | ICD-10-CM

## 2021-11-26 DIAGNOSIS — Z992 Dependence on renal dialysis: Secondary | ICD-10-CM | POA: Diagnosis not present

## 2021-11-26 DIAGNOSIS — N186 End stage renal disease: Secondary | ICD-10-CM | POA: Diagnosis not present

## 2021-11-26 DIAGNOSIS — N2581 Secondary hyperparathyroidism of renal origin: Secondary | ICD-10-CM | POA: Diagnosis not present

## 2021-11-26 DIAGNOSIS — L299 Pruritus, unspecified: Secondary | ICD-10-CM | POA: Diagnosis not present

## 2021-11-26 NOTE — Telephone Encounter (Signed)
Spoke to patient's son.Advised Pharm D agreed patient is on correct dose of Eliquis 2.5 mg twice a day.

## 2021-11-26 NOTE — Telephone Encounter (Signed)
Pt c/o medication issue:  1. Name of Medication:   apixaban (ELIQUIS) 2.5 MG TABS tablet    2. How are you currently taking this medication (dosage and times per day)? Take 1 tablet (2.5 mg total) by mouth 2 (two) times daily.  3. Are you having a reaction (difficulty breathing--STAT)? No  4. What is your medication issue? Pt's son calling to f/u on decreasing pt's dosage to 1 tablet a day as discussed at visit on last Friday. Son states that pt is on Dialysis and bleeds easily. He would like a callback regarding this matter. Please advise

## 2021-11-27 ENCOUNTER — Ambulatory Visit
Admission: RE | Admit: 2021-11-27 | Discharge: 2021-11-27 | Disposition: A | Discharge status: Home / Self Care | Payer: Medicare PPO | Source: Ambulatory Visit | Attending: Interventional Radiology | Admitting: Interventional Radiology

## 2021-11-27 ENCOUNTER — Encounter: Payer: Self-pay | Admitting: Radiology

## 2021-11-27 DIAGNOSIS — Z4682 Encounter for fitting and adjustment of non-vascular catheter: Secondary | ICD-10-CM | POA: Diagnosis not present

## 2021-11-27 DIAGNOSIS — N2889 Other specified disorders of kidney and ureter: Secondary | ICD-10-CM

## 2021-11-27 HISTORY — PX: IR RADIOLOGIST EVAL & MGMT: IMG5224

## 2021-11-27 NOTE — Progress Notes (Signed)
Chief Complaint: Patient was seen in consultation today for drain follow-up.  Referring Physician(s): Watts,John  History of Present Illness: Kim Burns is a 86 y.o. female with history of a left renal cell carcinoma that was treated with endovascular embolization and microwave ablation.  Patient developed a delayed urinary leak that required a ureter stent and perinephric drain.  Patient was recently seen in IR clinic was noted to have a persistent communication between the decompressed urinoma and the left renal collecting system.  The patient did not have any significant drainage from the perinephric tube for approximately 3 days and she was instructed to contact us if the tube stopped draining.  However, the patient now has a large amount of output from the drain.  Patient's main complaint is swelling and fullness in her left anterior abdomen when she is standing up and moving.  She has completed her antibiotic therapy.  She is on hemodialysis.  Patient denies fevers or chills.  Past Medical History:  Diagnosis Date   Anemia of chronic disease    takes iron   Anxiety    Blood transfusion without reported diagnosis    Bradycardia    Diabetes mellitus without complication ([place])    became diabetic after Whipple procedure   Diarrhea    Dysrhythmia 04/16/2016   bradycardia due to medication    ESRD on hemodialysis [facility])    M-W-F   GERD (gastroesophageal reflux disease)    Gout    Headache    History of kidney stones 06/2013   Hyperparathyroidism ([place])    Hypertension    PONV (postoperative nausea and vomiting)    Sleep apnea    no cpap machine. could not tolerate   Stroke ([place]) 04/16/2016   TIA 1995   Vitamin D deficiency     Past Surgical History:  Procedure Laterality Date   A/V FISTULAGRAM Left 08/30/2021   Procedure: A/V Fistulagram;  Surgeon: Marty Heck, MD;  Location: [place] [facility];  Service: Cardiovascular;  Laterality: Left;   ABDOMINAL  HYSTERECTOMY  1985   complete   BACK SURGERY  7425   lower   BASCILIC VEIN TRANSPOSITION Left 04/24/2017   Procedure: LEFT ARM FIRST STAGE BASILIC VEIN TRANSPOSITION;  Surgeon: Serafina Mitchell, MD;  Location: [facility];  Service: Vascular;  Laterality: Left;   [place] Left 07/10/2017   Procedure: SECOND STAGE BASILIC VEIN TRANSPOSITION LEFT ARM;  Surgeon: Serafina Mitchell, MD;  Location: [facility];  Service: Vascular;  Laterality: Left;   BREAST LUMPECTOMY Left x 2   many years apart, benign   CHOLECYSTECTOMY  1985   CYSTOSCOPY W/ URETERAL STENT PLACEMENT Left 10/31/2021   Procedure: CYSTOSCOPY WITH RETROGRADE PYELOGRAM/URETERAL STENT PLACEMENT;  Surgeon: Ardis Hughs, MD;  Location: [place];  Service: Urology;  Laterality: Left;   CYSTOSCOPY WITH URETEROSCOPY AND STENT PLACEMENT Left 06/18/2013   Procedure: CYSTOSCOPY WITH Lef URETEROSCOPY AND Left STENT PLACEMENT;  Surgeon: Dutch Gray, MD;  Location: [facility];  Service: Urology;  Laterality: Left;   ESOPHAGOGASTRODUODENOSCOPY (EGD) WITH PROPOFOL N/A 11/13/2016   Procedure: ESOPHAGOGASTRODUODENOSCOPY (EGD) WITH PROPOFOL;  Surgeon: Otis Brace, MD;  Location: [place];  Service: Gastroenterology;  Laterality: N/A;   EUS N/A 02/07/2016   Procedure: ESOPHAGEAL ENDOSCOPIC ULTRASOUND (EUS) RADIAL;  Surgeon: Arta Silence, MD;  Location: [facility];  Service: Endoscopy;  Laterality: N/A;   EYE SURGERY Bilateral 2014   ioc for catracts    FOOT SURGERY Left 1990   something  with toes unsure what    HERNIA REPAIR  2018   IR EMBO TUMOR ORGAN ISCHEMIA INFARCT INC GUIDE ROADMAPPING  08/22/2021   IR RADIOLOGIST EVAL & MGMT  05/18/2021   IR RADIOLOGIST EVAL & MGMT  10/01/2021   IR RADIOLOGIST EVAL & MGMT  11/12/2021   IR RADIOLOGIST EVAL & MGMT  11/22/2021   IR RADIOLOGIST EVAL & MGMT  11/27/2021   IR RENAL SUPRASEL UNI S&I MOD SED  08/22/2021   IR US GUIDE VASC ACCESS RIGHT  08/22/2021   LEFT HEART CATH AND CORONARY ANGIOGRAPHY  N/A 08/27/2021   Procedure: LEFT HEART CATH AND CORONARY ANGIOGRAPHY;  Surgeon: Burnell Blanks, MD;  Location: [place] [facility];  Service: Cardiovascular;  Laterality: N/A;   PARATHYROID EXPLORATION     PERIPHERAL VASCULAR BALLOON ANGIOPLASTY Left 08/30/2021   Procedure: PERIPHERAL VASCULAR BALLOON ANGIOPLASTY;  Surgeon: Marty Heck, MD;  Location: [place] [facility];  Service: Cardiovascular;  Laterality: Left;  arm fistula   RADIOLOGY WITH ANESTHESIA Left 08/22/2021   Procedure: MICROWAVE ABLATION;  Surgeon: Sandi Mariscal, MD;  Location: [facility];  Service: Anesthesiology;  Laterality: Left;   WHIPPLE PROCEDURE N/A 04/23/2016   Procedure: WHIPPLE PROCEDURE;  Surgeon: Stark Klein, MD;  Location: [place];  Service: General;  Laterality: N/A;    Allergies: Lopid [gemfibrozil], Lotemax [loteprednol etabonate], Statins, Norco [hydrocodone-acetaminophen], Other, Tomato, Vibra-tab [doxycycline], Codeine, and Hydrocodone-acetaminophen  Medications: Prior to Admission medications   Medication Sig Start Date End Date Taking? Authorizing Provider  acetaminophen (TYLENOL 8 HOUR ARTHRITIS PAIN) 650 MG CR tablet Take 1,300 mg by mouth daily as needed for pain.    [provider]  amiodarone (PACERONE) 200 MG tablet Take 0.5 tablets (100 mg total) by mouth daily. Please keep appointment for further refills 11/23/21   [place], Peter M, MD  apixaban (ELIQUIS) 2.5 MG TABS tablet Take 1 tablet (2.5 mg total) by mouth 2 (two) times daily. 09/25/21 03/24/22  [place], Peter M, MD  carvedilol (COREG) 3.125 MG tablet Take 1 tablet (3.125 mg total) by mouth 2 (two) times daily with a meal. Take on the days you do not have dialysis 11/04/21   Mercy Riding, MD  Cholecalciferol (VITAMIN D-3 PO) Take 1 capsule by mouth every Monday, Wednesday, and Friday with hemodialysis.    [provider]  colestipol (COLESTID) 1 g tablet Take 1 g by mouth See admin instructions. 1 tablet (1g) prior to dialysis  M-W-F + 1 tablet daily PRN diarrhea    [provider]  diclofenac sodium (VOLTAREN) 1 % GEL Apply 2 g topically 4 (four) times daily as needed (for hand pain).  Patient not taking: Reported on 11/23/2021    [provider]  insulin aspart (NOVOLOG FLEXPEN) 100 UNIT/ML FlexPen Inject 10 Units into the skin 3 (three) times daily as needed (CBG > 200).    [provider]  lidocaine-prilocaine (EMLA) cream Apply 1 Application topically every Monday, Wednesday, and Friday with hemodialysis.    [provider]  lipase/protease/amylase (CREON) 36000 UNITS CPEP capsule Take 1 capsule (36,000 Units total) by mouth with snacks. Patient taking differently: Take 36,000-72,000 Units by mouth See admin instructions. 2 capsules(72000 units) TID with meals AND 1 capsule(36000 units) with snacks 08/30/21 11/28/21  Bonnell Public Tublu, MD  midodrine (PROAMATINE) 10 MG tablet Take 10 mg by mouth as directed. 09/27/21   [provider]  polyethylene glycol powder (MIRALAX) 17 GM/SCOOP powder Take 17 g by mouth 2 (two) times daily  as needed for moderate constipation. Patient not taking: Reported on 11/23/2021 11/04/21   Mercy Riding, MD  RESTASIS 0.05 % ophthalmic emulsion 1 drop 2 (two) times daily. 11/06/21   [provider]  senna-docusate (SENOKOT-S) 8.6-50 MG tablet Take 1 tablet by mouth 2 (two) times daily between meals as needed for mild constipation. Patient not taking: Reported on 11/23/2021 11/04/21   Mercy Riding, MD  sevelamer carbonate (RENVELA) 800 MG tablet Take 1,600 mg by mouth 3 (three) times daily with meals.    [provider]  sodium chloride flush (NS) 0.9 % SOLN 5 mLs by Intracatheter route every 8 (eight) hours. Patient not taking: Reported on 11/23/2021 11/04/21   Mercy Riding, MD  XIIDRA 5 % SOLN Place 1 drop into both eyes at bedtime. Patient not taking: Reported on 11/23/2021 09/24/21   [provider]     Family History   Problem Relation Age of Onset   Heart disease Mother    Stroke Mother    Cancer Father        colon   Alzheimer's disease Sister    Alzheimer's disease Sister    Cancer Brother        brain   Breast cancer Neg Hx     Social History   Socioeconomic History   Marital status: Widowed    Spouse name: Not on file   Number of children: Not on file   Years of education: Not on file   Highest education level: Not on file  Occupational History   Not on file  Tobacco Use   Smoking status: Never   Smokeless tobacco: Never  Vaping Use   Vaping Use: Never used  Substance and Sexual Activity   Alcohol use: No   Drug use: No   Sexual activity: Not on file  Other Topics Concern   Not on file  Social History Narrative   Not on file   Social Determinants of Health   Financial Resource Strain: Not on file  Food Insecurity: No Food Insecurity (11/01/2021)   Hunger Vital Sign    Worried About Running Out of Food in the Last Year: Never true    Ran Out of Food in the Last Year: Never true  Transportation Needs: No Transportation Needs (11/01/2021)   PRAPARE - Hydrologist (Medical): No    Lack of Transportation (Non-Medical): No  Physical Activity: Not on file  Stress: Not on file  Social Connections: Not on file    Review of Systems  Constitutional:  Negative for chills and fever.  Gastrointestinal:  Positive for abdominal distention.    Vital Signs: There were no vitals taken for this visit.    Physical Exam Constitutional:      Appearance: She is not ill-appearing.  Abdominal:     General: Abdomen is flat.     Palpations: Abdomen is soft.     Comments: Fullness along the midline of the abdomen and left anterior abdomen when the patient sits up and moves around.  Left perinephric drain is intact.  No significant erythema around the drain.  Neurological:     Mental Status: She is alert.        Imaging: DG Sinus/Fist Tube Chk-Non  GI  Result Date: 11/27/2021 INDICATION: 86 year old with history of left renal cell carcinoma and status post endovascular embolization and microwave ablation on 08/22/2021. Patient developed a delayed urinary leak requiring a left ureter stent and left perinephric drainage  catheter. Drainage catheter was placed on 11/03/2011. Recent drain injection demonstrates a persistent communication between the decompressed urinoma and the left kidney collecting system. Patient presents for follow-up imaging because she did not have drainage from the tube for approximately 3 days. However, the patient now has a large amount of fluid within the drainage bag. EXAM: DRAIN INJECTION WITH FLUOROSCOPY MEDICATIONS: None ANESTHESIA/SEDATION: None FLUOROSCOPY TIME:  Radiation Exposure Index (as provided by the fluoroscopic device): 8.315 mGy Kerma COMPLICATIONS: None immediate. CONTRAST:  7 mL Omnipaque 350 PROCEDURE: Patient was placed prone. Left perinephric drain was injected under fluoroscopic examination. No drain was flushed with 4 mL sterile saline and attached to a gravity bag. FINDINGS: Left perinephric drain is unchanged in position. Contrast injection immediately demonstrates filling of a lower pole calyx and contrast fills the renal pelvis. Patient has a ureter stent with the proximal aspect in an upper pole calyx. Contrast fills the ureter stent. There is no significant collection around the drain itself. Again noted are embolization coils in the left kidney. IMPRESSION: 1. Persistent connection between the decompressed urinoma and the left kidney collecting system. 2. The tube was not draining for approximately 3 days and suspect this was related to an obstruction within the tube. Instructed the patient and her son to flush the tube with 5 mL of sterile saline if the tube stops draining in the future to make sure that the tube is not clogged. Patient has scheduled follow-up on 12/20/2021. Electronically Signed   By:  Markus Daft M.D.   On: 11/27/2021 12:25   IR Radiologist Eval & Mgmt  Result Date: 11/27/2021 Please refer to notes tab for details about interventional procedure. (Op Note)  DG Sinus/Fist Tube Chk-Non GI  Result Date: 11/22/2021 CLINICAL DATA:  History of left-sided renal cell carcinoma, post percutaneous coil embolization and microwave ablation performed 17/61/6073, complicated by development of a delayed urinary leak, requiring placement of a left-sided double-J ureteral stent on 10/21/2021 as well as a left-sided perinephric drainage catheter 11/02/2021. Subsequent abdominal CT performed 11/12/2021 demonstrates resolution of the previously identified perinephric urinoma however drainage catheter injection performed later that same date demonstrated communication between the decompressed urinoma and an inferior pole calyx. Patient returns to the interventional radiology drain clinic for drainage catheter evaluation and management. Patient reports intermittently bloody output from the left-sided perinephric drainage catheter with volumes of between 200 - 400 cc per day. She is completed her prescribed course of intravenous antibiotics. She denies worsening pain, fever or chills. EXAM: ABSCESS INJECTION COMPARISON:  CT abdomen and pelvis-11/12/2021; 10/29/2021 CT-guided percutaneous drainage catheter placement-11/02/2021 Intraoperative images during left-sided double-J ureteral stent placement-10/31/2021 CONTRAST:  10 cc Isovue-300-administered via the existing percutaneous drain. FLUOROSCOPY: 36 seconds (5.9 mGy) TECHNIQUE: The patient was positioned prone on the fluoroscopy table. A preprocedural spot fluoroscopic image was obtained of the left flank and the existing percutaneous drainage catheter. Multiple spot fluoroscopic and radiographic images were obtained following the injection of a small amount of contrast via the existing percutaneous drainage catheter. Images were reviewed and discussed with  the patient and the patient's son. The drainage catheter was flushed with a small amount of saline and connected to a gravity bag. Dressings were applied. The patient tolerated the procedure well without immediate postprocedural complication. FINDINGS: Preprocedural spot fluoroscopic image demonstrates stable positioning of left-sided perinephric drainage catheter with end coiled and locked adjacent to the expected location of the inferior pole the left kidney. The superior aspect of the left-sided double-J ureteral stent overlies  expected location of the superior aspect of the left renal collecting system. Embolization coils overlie the expected location of the mid/inferior pole of the left kidney Contrast injection demonstrates opacification of the decompressed urinoma with communication between the urinoma and an inferior pole calyx. There is once again opacification of the left renal collecting system with passage of contrast through the ureteral stent to the level of the urinary bladder. IMPRESSION: Drain injection demonstrates persistent communication between the decompressed urinoma and an inferior pole calyx. Electronically Signed   By: Sandi Mariscal M.D.   On: 11/22/2021 14:07   IR Radiologist Eval & Mgmt  Result Date: 11/22/2021 Please refer to notes tab for details about interventional procedure. (Op Note)  DG Sinus/Fist Tube Chk-Non GI  Result Date: 11/12/2021 CLINICAL DATA:  History of left-sided renal cell carcinoma, post percutaneous coil embolization and microwave ablation performed 60/73/7106 complicated by development of a delayed urinary leak requiring placement of a left-sided double-J ureteral stent on 10/31/2021 as well as a left-sided perinephric drainage catheter on 11/02/2021. Patient presents today to the interventional radiology drain clinic for drainage catheter evaluation and management. While the output from the left-sided perinephric drainage catheter initially was scant and  predominantly bloody, recently the output has increased and now resembles urine. Patient denies worsening pain, fever or chills. She is completing her prescribed course of intravenous antibiotics (administered following her dialysis sessions). Preceding CT scan of the abdomen pelvis demonstrates stable positioning of left-sided perinephric drainage catheter with minimal amount of residual stranding about the inferior pole the left kidney without new definable/drainable fluid collection. Mild residual left-sided pelvicaliectasis following double-J ureteral stent placement. Patient presents now for fluoroscopic guided drain injection. EXAM: ABSCESS INJECTION COMPARISON:  CT abdomen and pelvis-earlier same day; 10/29/2021; CT-guided percutaneous catheter placement-11/02/2021; intraoperative images during left-sided double-J ureteral stent placement-10/31/2021 CONTRAST:  10 cc Omnipaque 350-administered via the existing percutaneous drain. FLUOROSCOPY: 1 minute, 6 seconds (19.1 mGy) TECHNIQUE: The patient was positioned prone on the fluoroscopy table. A preprocedural spot fluoroscopic image was obtained of the left flank and the existing percutaneous drainage catheter. Multiple spot fluoroscopic and radiographic images were obtained following the injection of a small amount of contrast via the existing percutaneous drainage catheter. Images were reviewed and discussed with the patient and the patient's son. The drainage catheter was flushed with a small amount of saline and converted from a JP bulb to a gravity bag. A dressing was applied. The patient tolerated the procedure well without immediate postprocedural complication. FINDINGS: Preprocedural spot fluoroscopic image demonstrates stable positioning of left sided perinephric percutaneous drainage catheter with end coiled and locked adjacent to the expected location of the inferior pole of the left kidneys. Superior aspect of the left-sided double-J ureteral stent  overlies expected location of the superior pole of the left renal collecting system. Embolization coils overlie the mid/inferior pole the left kidney Contrast injection demonstrates opacification of the decompressed perinephric fluid collection with communication between the perinephric fluid collection and a left inferior pole calyx (image 8). Images demonstrate passage of contrast both through and around the left-sided double-J ureteral stent to the level of the urinary bladder. IMPRESSION: Drain injection confirms a communication between the decompressed perinephric urinoma and an inferior pole left renal calyx. PLAN: Above was discussed with patient's urologist, Dr. Louis Meckel, and the decision was made to convert the patient's perinephric drainage catheter from a JP bulb to a gravity bag. The patient was instructed to no longer flush the drainage catheter unless there is concern  for catheter occlusion (such as excessive pericatheter leakage). The patient will maintain diligent records regarding daily drainage catheter output with hopes that the output will reduce in the coming days. Additionally, the patient was encouraged to use the restroom on a regular basis to ensure maximal bladder decompression (though this is somewhat challenging given the patient's history of end-stage renal disease and overall poor urine output). The patient will return to the interventional radiology drain clinic on 11/22/2021 for repeat drainage catheter evaluation and management and drainage catheter injection (repeat CT scan is not necessary as there is no definable/drainable perinephric urinoma on today's exam). Electronically Signed   By: Sandi Mariscal M.D.   On: 11/12/2021 14:47   CT ABDOMEN PELVIS WO CONTRAST  Result Date: 11/12/2021 CLINICAL DATA:  History of left-sided renal cell carcinoma, post percutaneous coil embolization and microwave ablation performed 94/76/5465 complicated by development of a delayed urinary leak  requiring placement of a left-sided double-J ureteral stent on 10/31/2021 as well as a left-sided perinephric drainage catheter on 11/02/2021. Patient presents today to the interventional radiology drain clinic for drainage catheter evaluation and management. While the output from the left-sided perinephric drainage catheter initially was scant and predominantly bloody, recently the output has increased and now resembles urine. Patient denies worsening pain, fever or chills. The patient is completing her prescribed course of intravenous antibiotics (administered following her dialysis sessions). EXAM: CT ABDOMEN AND PELVIS WITHOUT CONTRAST TECHNIQUE: Multidetector CT imaging of the abdomen and pelvis was performed following the standard protocol without IV contrast. RADIATION DOSE REDUCTION: This exam was performed according to the departmental dose-optimization program which includes automated exposure control, adjustment of the mA and/or kV according to patient size and/or use of iterative reconstruction technique. COMPARISON:  CT abdomen pelvis-10/29/2021; CT-guided percutaneous catheter placement-11/02/2021 Intraoperative images during left-sided double-J ureteral stent placement-10/31/2021 FINDINGS: The lack of intravenous contrast limits the ability to evaluate solid abdominal organs. Lower chest: Limited visualization of the lower thorax redemonstrates a small mesenteric fat containing left-sided Bochdalek's hernia. Normal heart size. There is mild diffuse decreased attenuation of the intra cardiac blood pool suggestive of anemia. Coronary artery calcifications. No pericardial effusion. Hepatobiliary: Normal hepatic contour. Post cholecystectomy. No ascites. Pancreas: Sequela of previous Whipple procedure. Spleen: Normal noncontrast appearance of the spleen. Adrenals/Urinary Tract: Stable positioning of left-sided double-J ureteral stent with superior coil within the superior aspect of the left renal  collecting system and inferior coil within the decompressed urinary bladder. Stable positioning of left-sided perinephric drainage catheter with minimal amount residual stranding about the inferior aspect of the left kidney without new definable/drainable fluid collection. There remains high density material within the inferior pole of the left kidney of the sequela of percutaneous particle embolization. Redemonstrated multiple mixed attenuating renal lesions, incompletely evaluated on the present examination. Very mild residual pelvicaliectasis following left-sided double-J ureteral stent placement. No right-sided urinary obstruction. Stomach/Bowel: Sequela of previous Whipple procedural with associated gastrojejunostomy creation. Moderate-to-large colonic stool burden without evidence of enteric obstruction. Normal noncontrast appearance of the terminal ileum. The appendix is not visualized. No pneumoperitoneum, pneumatosis or portal venous gas. Vascular/Lymphatic: Atherosclerotic plaque within a tortuous but normal caliber abdominal aorta. No bulky retroperitoneal scratched at scattered retroperitoneal lymph nodes are numerous though individually not enlarged by size criteria, presumably reactive in etiology. No bulky retroperitoneal, mesenteric, pelvic or inguinal lymphadenopathy on this noncontrast examination. Reproductive: Post hysterectomy. No discrete adnexal lesions. Multiple phleboliths are seen with the lower pelvis bilaterally. Other: Mild diffuse body wall anasarca, most conspicuous about  the midline of the low back. Redemonstrated midline mesenteric fat containing incisional hernias. Musculoskeletal: No acute or aggressive osseous abnormalities. Moderate to severe multilevel lumbar spine DDD, worse at L3-L4, L4-L5 and L5-S1 with disc space height loss, endplate irregularity and sclerosis. Mild-to-moderate degenerative change the bilateral hips with joint space loss, subchondral sclerosis,  osteophytosis and subchondral geode/cyst formation, right-greater-than-left. IMPRESSION: 1. Stable positioning of left-sided perinephric drainage catheter with minimal amount of residual stranding about the inferior pole the left kidney without new definable/drainable fluid collection. 2. Mild residual left-sided pelvicaliectasis following double-J ureteral stent placement. 3. Sequela of previous Whipple procedure without evidence of complication. 4. Coronary artery calcifications. Aortic Atherosclerosis (ICD10-I70.0). 5. Stable ancillary findings as above. PLAN: Patient subsequently underwent percutaneous drainage catheter injection. Electronically Signed   By: Sandi Mariscal M.D.   On: 11/12/2021 14:38   IR Radiologist Eval & Mgmt  Result Date: 11/12/2021 Please refer to notes tab for details about interventional procedure. (Op Note)  CT GUIDED PERITONEAL/RETROPERITONEAL FLUID DRAIN BY PERC CATH  Result Date: 11/02/2021 INDICATION: 86 year old female with perinephric fluid collection, presents for drainage. EXAM: CT-GUIDED DRAINAGE OF LEFT PERINEPHRIC FLUID TECHNIQUE: Multidetector CT imaging of the abdomen was performed following the standard protocol without IV contrast. RADIATION DOSE REDUCTION: This exam was performed according to the departmental dose-optimization program which includes automated exposure control, adjustment of the mA and/or kV according to patient size and/or use of iterative reconstruction technique. MEDICATIONS: None ANESTHESIA/SEDATION: Moderate (conscious) sedation was employed during this procedure. A total of Versed 1.0 mg and Fentanyl 50 mcg was administered intravenously by the radiology nurse. Total intra-service moderate Sedation Time: 14 minutes. The patient's level of consciousness and vital signs were monitored continuously by radiology nursing throughout the procedure under my direct supervision. COMPLICATIONS: None PROCEDURE: Informed written consent was obtained from the  patient after a thorough discussion of the procedural risks, benefits and alternatives. All questions were addressed. Maximal Sterile Barrier Technique was utilized including caps, mask, sterile gowns, sterile gloves, sterile drape, hand hygiene and skin antiseptic. A timeout was performed prior to the initiation of the procedure. Patient position prone on the CT gantry table. Scout CT acquired for planning purposes. Using CT guidance, trocar needle was advanced into the fluid collection in the left perinephric space. Modified Seldinger technique was used to place a 10 [place] drain. Proximally 30 cc of complex/purulent sanguinous fluid aspirated. Culture was sent to the lab. Drain was attached to bulb suction and sutured in position. Final images were stored. Patient tolerated the procedure well and remained hemodynamically stable throughout. No complications were encountered and no significant blood loss. IMPRESSION: Status post CT-guided drainage of left perinephric fluid. Signed, Dulcy Fanny. Nadene Rubins, RPVI [facility] Radiology Electronically Signed   By: Corrie Mckusick D.O.   On: 11/02/2021 12:47   DG C-Arm 1-60 Min  Result Date: 10/31/2021 CLINICAL DATA:  Cystoscopy with retrograde pyelogram/ureteral stent placement. EXAM: DG C-ARM 1-60 MIN CONTRAST:  Intraoperative study.  Information not given. FLUOROSCOPY: Fluoroscopy Time:  2 minutes 2nd Radiation Exposure Index (if provided by the fluoroscopic device): 13.48 mGy Number of Acquired Spot Images: 0 COMPARISON:  CT 10/29/2021 FINDINGS: Multiple intraoperative spot images demonstrate decompressed right ureter. Severe right hydronephrosis. Final images demonstrate placement of left ureteral stent. IMPRESSION: Intraoperative imaging as above. Electronically Signed   By: Rolm Baptise M.D.   On: 10/31/2021 17:32   CT Renal Stone Study  Result Date: 10/29/2021 CLINICAL DATA:  Previous renal mass  with fever  EXAM: CT ABDOMEN AND PELVIS WITHOUT CONTRAST TECHNIQUE: Multidetector CT imaging of the abdomen and pelvis was performed following the standard protocol without IV contrast. RADIATION DOSE REDUCTION: This exam was performed according to the departmental dose-optimization program which includes automated exposure control, adjustment of the mA and/or kV according to patient size and/or use of iterative reconstruction technique. COMPARISON:  CT 09/06/2021, 08/25/2021, 08/22/2021, 05/03/2021 FINDINGS: Lower chest: Lung bases demonstrate no consolidation or effusion. Minimal mosaic attenuation which could be due to small airways disease. Coronary vascular calcification. Trace air in the anterior right atrium is presumably from line placement. Hepatobiliary: No focal liver abnormality is seen. Status post cholecystectomy. No biliary dilatation. Pancreas: Status post previous Whipple procedure. Atrophy of the distal pancreas without inflammation. Spleen: Normal in size without focal abnormality. Adrenals/Urinary Tract: Adrenal glands are within normal limits. Suspected cortical thinning within the bilateral kidneys. Numerous bilateral cysts and hyperdense renal cortical lesions, further assessment limited without contrast. Suspect that there is moderate severe left hydronephrosis. Embolization material in the left kidney slightly different orientation compared to prior, likely due to mass effect from large process in the lower pole. The patient's left renal mass measures approximately 3.1 cm, coronal series 6, image 46. Rounded area of heterogeneous density surrounding the lower pole renal lesion, there is large fluid component with remainder of mostly fat density, the area measures approximately 7.9 x 7.6 cm on coronal series 6 image 45; this measures 10.3 cm AP on series 3, image 40. It appears more organized compared to prior CT. The urinary bladder is unremarkable. Stomach/Bowel: The stomach shows postsurgical changes  and moderate debris. There is no dilated small bowel. No acute bowel wall thickening. Vascular/Lymphatic: Advanced aortic atherosclerosis. No aneurysm. Subcentimeter retroperitoneal lymph nodes. Reproductive: Status post hysterectomy. No adnexal masses. Other: Negative for pelvic effusion or free air. Numerous phleboliths and pelvic calcifications. Small fat containing ventral hernias. Musculoskeletal: No acute osseous abnormality. IMPRESSION: 1. Patient is status post ablation of mass in the lower pole of the left kidney. Interim organizing heterogenous mass at the lower pole of the left kidney surrounding the renal mass, this demonstrates mostly fat and fluid density and is presumably related to evolving ablation changes. There is no evidence for acute hematoma or active extravasation. There is however interim development of suspected moderate severe hydronephrosis of the left kidney with probable obstruction of the proximal ureter by the changes occurring in the lower pole of the left kidney. 2. History of prior Whipple procedure. No adverse features related to this finding allowing for limited exam due to absence of contrast. 3. Cardiomegaly Electronically Signed   By: Donavan Foil M.D.   On: 10/29/2021 20:31   DG Chest 2 View  Result Date: 10/29/2021 CLINICAL DATA:  Suspected sepsis EXAM: CHEST - 2 VIEW COMPARISON:  09/26/2021, 09/05/2021 FINDINGS: Low lung volumes. No pleural effusion or pneumothorax. No focal consolidation. Borderline cardiac size. Aortic atherosclerosis. Coils in the left upper quadrant. IMPRESSION: No active cardiopulmonary disease.  Hypoventilatory change. Electronically Signed   By: Donavan Foil M.D.   On: 10/29/2021 17:33    Labs:  CBC: Recent Labs    10/30/21 0226 10/31/21 0748 10/31/21 1513 11/01/21 0630 11/02/21 0558  WBC 11.5* 11.7*  --  8.4 8.5  HGB 8.0* 7.7* 9.9* 7.8* 8.0*  HCT 26.3* 25.5* 29.0* 25.2* 25.4*  PLT 326 331  --  337 356    COAGS: Recent Labs     08/22/21 0740 09/05/21 2115 10/29/21 1709 11/01/21  0630  INR 0.9 1.3* 1.4* 1.2    BMP: Recent Labs    10/30/21 0226 10/31/21 0748 10/31/21 1513 11/01/21 0630 11/02/21 0558  NA 139 137 136 137 137  K 4.2 4.5 4.5 4.4 4.5  CL 99 100 96* 99 95*  CO2 27 24  --  27 24  GLUCOSE 271* 172* 130* 136* 172*  BUN 29* 46* 18 25* 45*  CALCIUM 8.7* 8.9  --  8.6* 8.7*  CREATININE 4.11* 5.41* 2.70* 4.29* 5.77*  GFRNONAA 10* 7*  --  10* 7*    LIVER FUNCTION TESTS: Recent Labs    09/19/21 1825 10/25/21 1056 10/29/21 1709 10/30/21 0226 10/31/21 0748 11/01/21 0630 11/02/21 0558  BILITOT 0.6 0.3 0.6 0.7  --   --   --   AST 34 '15 26 15  '$ --   --   --   ALT 42 '24 19 16  '$ --   --   --   ALKPHOS 89 98 67 62  --   --   --   PROT 6.4* 6.7 6.6 6.3*  --   --   --   ALBUMIN 2.7* 3.5* 2.3* 2.2* 2.0* 2.1* 2.2*    TUMOR MARKERS: No results for input(s): "AFPTM", "CEA", "CA199", "CHROMGRNA" in the last 8760 hours.  Assessment and Plan:  86 year old with history of a treated left renal cell carcinoma using endovascular embolization and microwave ablation.  Patient had a delayed urinary leak that has been treated with a perinephric drain and ureter stent.  The patient had minimal output from the drain for approximately 3 days but now there is resumed output from the drain.  Repeat drain injection confirms persistent connection between the decompressed urinoma and the left renal collecting system. I suspect that the perinephric drain was temporarily clogged or occluded.  I instructed the patient and her son to gently flush the tube with sterile saline if it stops draining for over 1 day.  If the drain is flushing normally but still has no output for up to 5 days, I instructed them to contact us again and we could discuss another drain injection.  Otherwise, the patient is scheduled to follow-up in drain clinic on 12/20/2021.  Patient's main complaint is abdominal fullness and swelling along the anterior  midline and left anterior abdomen when she gets up and moves.  I reviewed the prior CT and the patient does have a small ventral hernia.  On examination, I suspect this fullness is related to a ventral hernia and/or abdominal wall laxity.  Patient will continue to monitor this area.  Electronically Signed: Burman Riis 11/27/2021, 2:43 PM   I spent a total of    10 Minutes in face to face in clinical consultation, greater than 50% of which was counseling/coordinating care for drain management patient ID: Kim Burns, female   DOB: 1935-11-10, 86 y.o.   MRN: [id number]

## 2021-11-28 DIAGNOSIS — L299 Pruritus, unspecified: Secondary | ICD-10-CM | POA: Diagnosis not present

## 2021-11-28 DIAGNOSIS — Z992 Dependence on renal dialysis: Secondary | ICD-10-CM | POA: Diagnosis not present

## 2021-11-28 DIAGNOSIS — N186 End stage renal disease: Secondary | ICD-10-CM | POA: Diagnosis not present

## 2021-11-28 DIAGNOSIS — N2581 Secondary hyperparathyroidism of renal origin: Secondary | ICD-10-CM | POA: Diagnosis not present

## 2021-11-30 DIAGNOSIS — L299 Pruritus, unspecified: Secondary | ICD-10-CM | POA: Diagnosis not present

## 2021-11-30 DIAGNOSIS — Z992 Dependence on renal dialysis: Secondary | ICD-10-CM | POA: Diagnosis not present

## 2021-11-30 DIAGNOSIS — N2581 Secondary hyperparathyroidism of renal origin: Secondary | ICD-10-CM | POA: Diagnosis not present

## 2021-11-30 DIAGNOSIS — N186 End stage renal disease: Secondary | ICD-10-CM | POA: Diagnosis not present

## 2021-12-03 DIAGNOSIS — N2581 Secondary hyperparathyroidism of renal origin: Secondary | ICD-10-CM | POA: Diagnosis not present

## 2021-12-03 DIAGNOSIS — Z992 Dependence on renal dialysis: Secondary | ICD-10-CM | POA: Diagnosis not present

## 2021-12-03 DIAGNOSIS — L299 Pruritus, unspecified: Secondary | ICD-10-CM | POA: Diagnosis not present

## 2021-12-03 DIAGNOSIS — N186 End stage renal disease: Secondary | ICD-10-CM | POA: Diagnosis not present

## 2021-12-04 DIAGNOSIS — M87051 Idiopathic aseptic necrosis of right femur: Secondary | ICD-10-CM | POA: Diagnosis not present

## 2021-12-04 DIAGNOSIS — S63501A Unspecified sprain of right wrist, initial encounter: Secondary | ICD-10-CM | POA: Diagnosis not present

## 2021-12-04 DIAGNOSIS — E1165 Type 2 diabetes mellitus with hyperglycemia: Secondary | ICD-10-CM | POA: Diagnosis not present

## 2021-12-05 DIAGNOSIS — Z992 Dependence on renal dialysis: Secondary | ICD-10-CM | POA: Diagnosis not present

## 2021-12-05 DIAGNOSIS — L299 Pruritus, unspecified: Secondary | ICD-10-CM | POA: Diagnosis not present

## 2021-12-05 DIAGNOSIS — N2581 Secondary hyperparathyroidism of renal origin: Secondary | ICD-10-CM | POA: Diagnosis not present

## 2021-12-05 DIAGNOSIS — N186 End stage renal disease: Secondary | ICD-10-CM | POA: Diagnosis not present

## 2021-12-07 DIAGNOSIS — N2581 Secondary hyperparathyroidism of renal origin: Secondary | ICD-10-CM | POA: Diagnosis not present

## 2021-12-07 DIAGNOSIS — Z992 Dependence on renal dialysis: Secondary | ICD-10-CM | POA: Diagnosis not present

## 2021-12-07 DIAGNOSIS — L299 Pruritus, unspecified: Secondary | ICD-10-CM | POA: Diagnosis not present

## 2021-12-07 DIAGNOSIS — N186 End stage renal disease: Secondary | ICD-10-CM | POA: Diagnosis not present

## 2021-12-10 DIAGNOSIS — N186 End stage renal disease: Secondary | ICD-10-CM | POA: Diagnosis not present

## 2021-12-10 DIAGNOSIS — N2581 Secondary hyperparathyroidism of renal origin: Secondary | ICD-10-CM | POA: Diagnosis not present

## 2021-12-10 DIAGNOSIS — M25551 Pain in right hip: Secondary | ICD-10-CM | POA: Diagnosis not present

## 2021-12-10 DIAGNOSIS — L299 Pruritus, unspecified: Secondary | ICD-10-CM | POA: Diagnosis not present

## 2021-12-10 DIAGNOSIS — Z992 Dependence on renal dialysis: Secondary | ICD-10-CM | POA: Diagnosis not present

## 2021-12-11 DIAGNOSIS — G4700 Insomnia, unspecified: Secondary | ICD-10-CM | POA: Diagnosis not present

## 2021-12-11 DIAGNOSIS — I4891 Unspecified atrial fibrillation: Secondary | ICD-10-CM | POA: Diagnosis not present

## 2021-12-11 DIAGNOSIS — G2581 Restless legs syndrome: Secondary | ICD-10-CM | POA: Diagnosis not present

## 2021-12-11 DIAGNOSIS — R252 Cramp and spasm: Secondary | ICD-10-CM | POA: Diagnosis not present

## 2021-12-11 DIAGNOSIS — I10 Essential (primary) hypertension: Secondary | ICD-10-CM | POA: Diagnosis not present

## 2021-12-11 DIAGNOSIS — D509 Iron deficiency anemia, unspecified: Secondary | ICD-10-CM | POA: Diagnosis not present

## 2021-12-11 DIAGNOSIS — N183 Chronic kidney disease, stage 3 unspecified: Secondary | ICD-10-CM | POA: Diagnosis not present

## 2021-12-11 DIAGNOSIS — E1165 Type 2 diabetes mellitus with hyperglycemia: Secondary | ICD-10-CM | POA: Diagnosis not present

## 2021-12-12 DIAGNOSIS — Z992 Dependence on renal dialysis: Secondary | ICD-10-CM | POA: Diagnosis not present

## 2021-12-12 DIAGNOSIS — N186 End stage renal disease: Secondary | ICD-10-CM | POA: Diagnosis not present

## 2021-12-12 DIAGNOSIS — L299 Pruritus, unspecified: Secondary | ICD-10-CM | POA: Diagnosis not present

## 2021-12-12 DIAGNOSIS — Z23 Encounter for immunization: Secondary | ICD-10-CM | POA: Diagnosis not present

## 2021-12-12 DIAGNOSIS — N2581 Secondary hyperparathyroidism of renal origin: Secondary | ICD-10-CM | POA: Diagnosis not present

## 2021-12-12 NOTE — Progress Notes (Deleted)
Cardiology Office Note   Date:  12/12/2021   ID:  Kim, Burns [birth year], MRN [id number]  PCP:  [name], MD  Cardiologist:   Kiandre Spagnolo [place], MD   No chief complaint on file.     History of Present Illness: Kim Burns is a 86 y.o. female who is seen at the request of Dr Ernie Hew.  She was seen remotely by me in 2014 for evaluation of bradycardia. She has a history of CKD and HTN. Echo in 2015 showed LVH with normal systolic function. She had a Whipple procedure in 2014 with pancreatectomy and partial bowel resection. She developed ESRD felt to be related to DM and HTN. She has been on dialysis since January this year. With dialysis she has developed orthostatic hypotension that is worse on dialysis days. Her antihypertensives have been discontinued and she is now on midodrine on dialysis days. No syncope. She also has bilateral carpel tunnel syndrome and surgery has been recommended. Seeing Dr Fredna Dow. She has a slow growing renal cancer on her left kidney and underwent ablation of this in July.  She denies any chest pain or SOB. Does tend to bleed a lot from her vascular access site with dialysis.   In July she developed  new onset atrial fibrillation with RVR during hospitalization in early July 2023 after an ablation procedure for left renal cell carcinoma. She was started on rate control agents as well as Eliquis for stroke prophylaxis.  She had spontaneously converted back to sinus rhythm prior to discharge after receiving IV Cardizem. CTA of abdomen and chest did not show any evidence of PE, 3 mm left pulmonary nodule noted, stable solid mass in the medial aspect of the left kidney.  Diagnostic cardiac catheterization on 7/10 due to elevated troponins on 7/8 revealed nonobstructive disease with 20% proximal RCA, 20% left circumflex artery, 20% mid left main, 30% ostial LAD, 50% proximal LAD, EF 55 to 65%.  Repeat echo obtained on the same day revealed LVEF of 60 to 65%,  moderately reduced RV systolic function, grade 3 diastolic dysfunction, RSVP 46.1 mmHg, severe biatrial enlargement, mild MR, mild to moderate TR.    She was admitted 9/11-9/17/23 with acute sepsis. CT abdomen and pelvis in ED showed interval development of post ablation changes to left kidney with additional interval development of severe hydronephrosis with obstruction of the proximal ureter.  She also had lactic acidosis and leukocytosis.  Cultures obtained.  She was started on vancomycin and Zosyn. Blood culture with Streptococcus gallolyticus in 1 out of 2 bottles.  Patient had left ureteral stent placement by urology on 9/13, and drain placed for left perinephric abscess by IR on 9/15.   Since DC she has continued to feel weak. She is sleeping a lot during the day but not at night. She has not experienced hypotension on dialysis. Is only taking Coreg on nondialysis days. Is concerned about dosing of Eliquis. Gets SOB easy when going up steps. Has chronic anemia with Hgb 8.   Past Medical History:  Diagnosis Date   Anemia of chronic disease    takes iron   Anxiety    Blood transfusion without reported diagnosis    Bradycardia    Diabetes mellitus without complication ([place])    became diabetic after Whipple procedure   Diarrhea    Dysrhythmia 04/16/2016   bradycardia due to medication    ESRD on hemodialysis (HCC)    M-W-F   GERD (gastroesophageal reflux disease)  Gout    Headache    History of kidney stones 06/2013   Hyperparathyroidism ([place])    Hypertension    PONV (postoperative nausea and vomiting)    Sleep apnea    no cpap machine. could not tolerate   Stroke ([place]) 04/16/2016   TIA 1995   Vitamin D deficiency     Past Surgical History:  Procedure Laterality Date   A/V FISTULAGRAM Left 08/30/2021   Procedure: A/V Fistulagram;  Surgeon: Marty Heck, MD;  Location: [place] [facility];  Service: Cardiovascular;  Laterality: Left;   ABDOMINAL HYSTERECTOMY  1985    complete   BACK SURGERY  4627   lower   BASCILIC VEIN TRANSPOSITION Left 04/24/2017   Procedure: LEFT ARM FIRST STAGE BASILIC VEIN TRANSPOSITION;  Surgeon: Serafina Mitchell, MD;  Location: [facility];  Service: Vascular;  Laterality: Left;   [place] Left 07/10/2017   Procedure: SECOND STAGE BASILIC VEIN TRANSPOSITION LEFT ARM;  Surgeon: Serafina Mitchell, MD;  Location: [facility];  Service: Vascular;  Laterality: Left;   BREAST LUMPECTOMY Left x 2   many years apart, benign   CHOLECYSTECTOMY  1985   CYSTOSCOPY W/ URETERAL STENT PLACEMENT Left 10/31/2021   Procedure: CYSTOSCOPY WITH RETROGRADE PYELOGRAM/URETERAL STENT PLACEMENT;  Surgeon: Ardis Hughs, MD;  Location: [place];  Service: Urology;  Laterality: Left;   CYSTOSCOPY WITH URETEROSCOPY AND STENT PLACEMENT Left 06/18/2013   Procedure: CYSTOSCOPY WITH Lef URETEROSCOPY AND Left STENT PLACEMENT;  Surgeon: Dutch Gray, MD;  Location: [facility];  Service: Urology;  Laterality: Left;   ESOPHAGOGASTRODUODENOSCOPY (EGD) WITH PROPOFOL N/A 11/13/2016   Procedure: ESOPHAGOGASTRODUODENOSCOPY (EGD) WITH PROPOFOL;  Surgeon: Otis Brace, MD;  Location: [place];  Service: Gastroenterology;  Laterality: N/A;   EUS N/A 02/07/2016   Procedure: ESOPHAGEAL ENDOSCOPIC ULTRASOUND (EUS) RADIAL;  Surgeon: Arta Silence, MD;  Location: [facility];  Service: Endoscopy;  Laterality: N/A;   EYE SURGERY Bilateral 2014   ioc for catracts    FOOT SURGERY Left 1990   something with toes unsure what    HERNIA REPAIR  2018   IR EMBO TUMOR ORGAN ISCHEMIA INFARCT INC GUIDE ROADMAPPING  08/22/2021   IR RADIOLOGIST EVAL & MGMT  05/18/2021   IR RADIOLOGIST EVAL & MGMT  10/01/2021   IR RADIOLOGIST EVAL & MGMT  11/12/2021   IR RADIOLOGIST EVAL & MGMT  11/22/2021   IR RADIOLOGIST EVAL & MGMT  11/27/2021   IR RENAL SUPRASEL UNI S&I MOD SED  08/22/2021   IR US GUIDE VASC ACCESS RIGHT  08/22/2021   LEFT HEART CATH AND CORONARY ANGIOGRAPHY N/A 08/27/2021    Procedure: LEFT HEART CATH AND CORONARY ANGIOGRAPHY;  Surgeon: Burnell Blanks, MD;  Location: [place] [facility];  Service: Cardiovascular;  Laterality: N/A;   PARATHYROID EXPLORATION     PERIPHERAL VASCULAR BALLOON ANGIOPLASTY Left 08/30/2021   Procedure: PERIPHERAL VASCULAR BALLOON ANGIOPLASTY;  Surgeon: Marty Heck, MD;  Location: [place] [facility];  Service: Cardiovascular;  Laterality: Left;  arm fistula   RADIOLOGY WITH ANESTHESIA Left 08/22/2021   Procedure: MICROWAVE ABLATION;  Surgeon: Sandi Mariscal, MD;  Location: [facility];  Service: Anesthesiology;  Laterality: Left;   WHIPPLE PROCEDURE N/A 04/23/2016   Procedure: WHIPPLE PROCEDURE;  Surgeon: Stark Klein, MD;  Location: [facility];  Service: General;  Laterality: N/A;     Current Outpatient Medications  Medication Sig Dispense Refill   acetaminophen (TYLENOL 8 HOUR ARTHRITIS PAIN) 650 MG CR tablet Take 1,300 mg by  mouth daily as needed for pain.     amiodarone (PACERONE) 200 MG tablet Take 0.5 tablets (100 mg total) by mouth daily. Please keep appointment for further refills 90 tablet 3   apixaban (ELIQUIS) 2.5 MG TABS tablet Take 1 tablet (2.5 mg total) by mouth 2 (two) times daily. 60 tablet 5   carvedilol (COREG) 3.125 MG tablet Take 1 tablet (3.125 mg total) by mouth 2 (two) times daily with a meal. Take on the days you do not have dialysis 60 tablet 1   Cholecalciferol (VITAMIN D-3 PO) Take 1 capsule by mouth every Monday, Wednesday, and Friday with hemodialysis.     colestipol (COLESTID) 1 g tablet Take 1 g by mouth See admin instructions. 1 tablet (1g) prior to dialysis M-W-F + 1 tablet daily PRN diarrhea     diclofenac sodium (VOLTAREN) 1 % GEL Apply 2 g topically 4 (four) times daily as needed (for hand pain).  (Patient not taking: Reported on 11/23/2021)     insulin aspart (NOVOLOG FLEXPEN) 100 UNIT/ML FlexPen Inject 10 Units into the skin 3 (three) times daily as needed (CBG > 200).     lidocaine-prilocaine (EMLA)  cream Apply 1 Application topically every Monday, Wednesday, and Friday with hemodialysis.     midodrine (PROAMATINE) 10 MG tablet Take 10 mg by mouth as directed.     polyethylene glycol powder (MIRALAX) 17 GM/SCOOP powder Take 17 g by mouth 2 (two) times daily as needed for moderate constipation. (Patient not taking: Reported on 11/23/2021) 255 g 0   RESTASIS 0.05 % ophthalmic emulsion 1 drop 2 (two) times daily.     senna-docusate (SENOKOT-S) 8.6-50 MG tablet Take 1 tablet by mouth 2 (two) times daily between meals as needed for mild constipation. (Patient not taking: Reported on 11/23/2021) 180 tablet 0   sevelamer carbonate (RENVELA) 800 MG tablet Take 1,600 mg by mouth 3 (three) times daily with meals.     sodium chloride flush (NS) 0.9 % SOLN 5 mLs by Intracatheter route every 8 (eight) hours. (Patient not taking: Reported on 11/23/2021)     XIIDRA 5 % SOLN Place 1 drop into both eyes at bedtime. (Patient not taking: Reported on 11/23/2021)     No current facility-administered medications for this visit.    Allergies:   Lopid [gemfibrozil], Lotemax [loteprednol etabonate], Statins, Norco [hydrocodone-acetaminophen], Other, Tomato, Vibra-tab [doxycycline], Codeine, and Hydrocodone-acetaminophen    Social History:  The patient  reports that she has never smoked. She has never used smokeless tobacco. She reports that she does not drink alcohol and does not use drugs.   Family History:  The patient's family history includes Alzheimer's disease in her sister and sister; Cancer in her brother and father; Heart disease in her mother; Stroke in her mother.    ROS:  Please see the history of present illness.   Otherwise, review of systems are positive for none.   All other systems are reviewed and negative.    PHYSICAL EXAM: VS:  There were no vitals taken for this visit. , BMI There is no height or weight on file to calculate BMI. GEN: chronically ill appearing, in no acute distress.  HEENT:  normal Neck: no JVD, carotid bruits, or masses Cardiac: RRR; radiated murmur from her AV fistula. no murmurs, rubs, or gallops,no edema. Functioning AV fistula in left upper arm. Respiratory:  clear to auscultation bilaterally, normal work of breathing GI: soft, nontender, nondistended, + BS MS: no deformity or atrophy Skin: warm and dry, no  rash Neuro:  Strength and sensation are intact Psych: euthymic mood, full affect   EKG:  EKG is not ordered today.   Recent Labs: 09/05/2021: B Natriuretic Peptide 2,213.9 10/25/2021: TSH 1.480 10/30/2021: ALT 16 11/02/2021: BUN 45; Creatinine, Ser 5.77; Hemoglobin 8.0; Magnesium 2.2; Platelets 356; Potassium 4.5; Sodium 137    Lipid Panel    Component Value Date/Time   CHOL 95 08/26/2021 0447   CHOL 133 08/09/2021 1143   TRIG 110 08/26/2021 0447   HDL 35 (L) 08/26/2021 0447   HDL 43 08/09/2021 1143   CHOLHDL 2.7 08/26/2021 0447   VLDL 22 08/26/2021 0447   LDLCALC 38 08/26/2021 0447   LDLCALC 68 08/09/2021 1143      Wt Readings from Last 3 Encounters:  11/23/21 129 lb 9.6 oz (58.8 kg)  11/07/21 129 lb (58.5 kg)  11/02/21 129 lb 10.1 oz (58.8 kg)      Other studies Reviewed: Additional studies/ records that were reviewed today include:   Echo 06/19/13: Study Conclusions   - Left ventricle: The cavity size was normal. There was    severe focal basal and moderate concentric hypertrophy.    Systolic function was normal. The estimated ejection    fraction was in the range of 60% to 65%. Wall motion was    normal; there were no regional wall motion abnormalities.  - Aortic valve: Valve area: 1.69cm^2 (Vmax).  - Mitral valve: Mild to moderate regurgitation.  - Left atrium: The atrium was mildly dilated.  - Atrial septum: There was increased thickness of the    septum, consistent with lipomatous hypertrophy.   Echo 08/27/21: IMPRESSIONS     1. Right ventricular systolic function is moderately reduced. The right  ventricular size is  mildly enlarged. There is moderately elevated  pulmonary artery systolic pressure. The estimated right ventricular  systolic pressure is 16.9 mmHg. Normal apical  function with free wall hypokinesis, consistent with McConnell's sign as  can be seen in acute PE (image 58). Consider evaluation for PE.   2. Left ventricular ejection fraction, by estimation, is 60 to 65%. The  left ventricle has normal function. The left ventricle has no regional  wall motion abnormalities. Left ventricular diastolic parameters are  consistent with Grade III diastolic  dysfunction (restrictive). Elevated left atrial pressure.   3. Left atrial size was severely dilated.   4. Right atrial size was severely dilated.   5. The mitral valve is degenerative. Mild mitral valve regurgitation. No  evidence of mitral stenosis. Moderate mitral annular calcification.   6. Tricuspid valve regurgitation is mild to moderate.   7. The aortic valve is tricuspid. Aortic valve regurgitation is not  visualized. Aortic valve sclerosis is present, with no evidence of aortic  valve stenosis.   8. The inferior vena cava is dilated in size with <50% respiratory  variability, suggesting right atrial pressure of 15 mmHg.   Cardiac cath 08/27/21:  LEFT HEART CATH AND CORONARY ANGIOGRAPHY   Conclusion      Prox RCA lesion is 20% stenosed.   Mid LM to Dist LM lesion is 20% stenosed.   Ost LAD lesion is 30% stenosed.   Mid Cx lesion is 20% stenosed.   Prox LAD lesion is 50% stenosed.   The left ventricular systolic function is normal.   LV end diastolic pressure is normal.   The left ventricular ejection fraction is 55-65% by visual estimate.   There is no mitral valve regurgitation.   Mild non-obstructive CAD Normal LV  systolic function Elevated troponin and chest pain is likely due to demand ischemic in the setting of rapid atrial fibrillation.    Recommendation: Medical management of CAD.   Coronary  Diagrams  Diagnostic Dominance: Right  Interve  ASSESSMENT AND PLAN:  1.  ESRD on dialysis. Volume status appears OK 2. Orthostatic hypotension. This has improved and reviewed records of BP reading from home. No longer on midodrine. On low dose Coreg on nondialysis days. Will continue to monitor. If she does develop hypotension my need to stop Coreg but she is tolerating well now.   3. Bilateral carpel tunnel syndrome 4. DM 5. History of HTN 6. Renal tumor s/p ablation 7. Recent sepsis with hydronephrosis and abscess. S/p drain.  8. Nonobstructive CAD 9. Paroxysmal Afib following ablation procedure. Converted on amiodarone. Surprisingly did not have Afib during recent hospitalization despite high stress. Will reduce dose of amiodarone to 100 mg daily. If no recurrent afib in several months may be able to discontinue. [place] Vasc score is 7. Would favor continuing Eliquis. Will check with our pharm D to make sure she is on appropriate dose.        Disposition:   FU with me 3 months   Signed, Dywane Peruski [place], MD  12/12/2021 8:29 AM    [facility] [address] [place], [place], [address] Phone [phone], Fax [phone]

## 2021-12-14 DIAGNOSIS — N186 End stage renal disease: Secondary | ICD-10-CM | POA: Diagnosis not present

## 2021-12-14 DIAGNOSIS — N2581 Secondary hyperparathyroidism of renal origin: Secondary | ICD-10-CM | POA: Diagnosis not present

## 2021-12-14 DIAGNOSIS — L299 Pruritus, unspecified: Secondary | ICD-10-CM | POA: Diagnosis not present

## 2021-12-14 DIAGNOSIS — Z992 Dependence on renal dialysis: Secondary | ICD-10-CM | POA: Diagnosis not present

## 2021-12-17 DIAGNOSIS — D49512 Neoplasm of unspecified behavior of left kidney: Secondary | ICD-10-CM | POA: Diagnosis not present

## 2021-12-17 DIAGNOSIS — N2581 Secondary hyperparathyroidism of renal origin: Secondary | ICD-10-CM | POA: Diagnosis not present

## 2021-12-17 DIAGNOSIS — Z992 Dependence on renal dialysis: Secondary | ICD-10-CM | POA: Diagnosis not present

## 2021-12-17 DIAGNOSIS — N186 End stage renal disease: Secondary | ICD-10-CM | POA: Diagnosis not present

## 2021-12-18 DIAGNOSIS — M25531 Pain in right wrist: Secondary | ICD-10-CM | POA: Diagnosis not present

## 2021-12-18 DIAGNOSIS — Z992 Dependence on renal dialysis: Secondary | ICD-10-CM | POA: Diagnosis not present

## 2021-12-18 DIAGNOSIS — I158 Other secondary hypertension: Secondary | ICD-10-CM | POA: Diagnosis not present

## 2021-12-18 DIAGNOSIS — N186 End stage renal disease: Secondary | ICD-10-CM | POA: Diagnosis not present

## 2021-12-19 DIAGNOSIS — N2581 Secondary hyperparathyroidism of renal origin: Secondary | ICD-10-CM | POA: Diagnosis not present

## 2021-12-19 DIAGNOSIS — N186 End stage renal disease: Secondary | ICD-10-CM | POA: Diagnosis not present

## 2021-12-19 DIAGNOSIS — L299 Pruritus, unspecified: Secondary | ICD-10-CM | POA: Diagnosis not present

## 2021-12-19 DIAGNOSIS — Z992 Dependence on renal dialysis: Secondary | ICD-10-CM | POA: Diagnosis not present

## 2021-12-20 ENCOUNTER — Ambulatory Visit
Admission: RE | Admit: 2021-12-20 | Discharge: 2021-12-20 | Disposition: A | Discharge status: Home / Self Care | Payer: Medicare PPO | Source: Ambulatory Visit | Attending: Urology | Admitting: Urology

## 2021-12-20 ENCOUNTER — Encounter: Payer: Self-pay | Admitting: Lab

## 2021-12-20 ENCOUNTER — Ambulatory Visit
Admission: RE | Admit: 2021-12-20 | Discharge: 2021-12-20 | Disposition: A | Discharge status: Home / Self Care | Payer: Medicare PPO | Source: Ambulatory Visit | Attending: Interventional Radiology | Admitting: Interventional Radiology

## 2021-12-20 DIAGNOSIS — N2889 Other specified disorders of kidney and ureter: Secondary | ICD-10-CM

## 2021-12-20 DIAGNOSIS — M25531 Pain in right wrist: Secondary | ICD-10-CM | POA: Diagnosis not present

## 2021-12-20 DIAGNOSIS — Z4682 Encounter for fitting and adjustment of non-vascular catheter: Secondary | ICD-10-CM | POA: Diagnosis not present

## 2021-12-20 HISTORY — PX: IR RADIOLOGIST EVAL & MGMT: IMG5224

## 2021-12-20 NOTE — Progress Notes (Signed)
Patient ID: Kim Burns, female   DOB: March 15, 1935, 86 y.o.   MRN: [id number]        Chief Complaint: Urine leak post renal directed therapy for RCC  Referring Physician(s): Louis Meckel  History of Present Illness:  86 year old with history of left-sided renal cell carcinoma, post endovascular embolization and microwave ablation on 08/22/2021.  Patient developed a delayed urinary leak requiring left ureteral stent placement on 10/21/2021 and ultimately placement of a perinephric drainage catheter on 11/02/2021.  Subsequent CT scan of the abdomen pelvis performed 11/12/2021 demonstrated resolution of previously identified perinephric urinoma, however subsequent drainage catheter injections have demonstrated persistent communication between the decompressed urinoma and an inferior pole renal calyx.  Patient returns to the interventional radiology drain clinic for drainage catheter evaluation and management.  Patient continues to report approximately 400 cc urine from the drainage catheter per day.    She is not currently on antibiotics.  She denies worsening pain, fever or chills.  Past Medical History:  Diagnosis Date   Anemia of chronic disease    takes iron   Anxiety    Blood transfusion without reported diagnosis    Bradycardia    Diabetes mellitus without complication ([place])    became diabetic after Whipple procedure   Diarrhea    Dysrhythmia 04/16/2016   bradycardia due to medication    ESRD on hemodialysis [facility])    M-W-F   GERD (gastroesophageal reflux disease)    Gout    Headache    History of kidney stones 06/2013   Hyperparathyroidism ([place])    Hypertension    PONV (postoperative nausea and vomiting)    Sleep apnea    no cpap machine. could not tolerate   Stroke ([place]) 04/16/2016   TIA 1995   Vitamin D deficiency     Past Surgical History:  Procedure Laterality Date   A/V FISTULAGRAM Left 08/30/2021   Procedure: A/V Fistulagram;  Surgeon: Marty Heck,  MD;  Location: [place] [facility];  Service: Cardiovascular;  Laterality: Left;   ABDOMINAL HYSTERECTOMY  1985   complete   BACK SURGERY  7353   lower   BASCILIC VEIN TRANSPOSITION Left 04/24/2017   Procedure: LEFT ARM FIRST STAGE BASILIC VEIN TRANSPOSITION;  Surgeon: Serafina Mitchell, MD;  Location: [facility];  Service: Vascular;  Laterality: Left;   [place] Left 07/10/2017   Procedure: SECOND STAGE BASILIC VEIN TRANSPOSITION LEFT ARM;  Surgeon: Serafina Mitchell, MD;  Location: [facility];  Service: Vascular;  Laterality: Left;   BREAST LUMPECTOMY Left x 2   many years apart, benign   CHOLECYSTECTOMY  1985   CYSTOSCOPY W/ URETERAL STENT PLACEMENT Left 10/31/2021   Procedure: CYSTOSCOPY WITH RETROGRADE PYELOGRAM/URETERAL STENT PLACEMENT;  Surgeon: Ardis Hughs, MD;  Location: [place];  Service: Urology;  Laterality: Left;   CYSTOSCOPY WITH URETEROSCOPY AND STENT PLACEMENT Left 06/18/2013   Procedure: CYSTOSCOPY WITH Lef URETEROSCOPY AND Left STENT PLACEMENT;  Surgeon: Dutch Gray, MD;  Location: [facility];  Service: Urology;  Laterality: Left;   ESOPHAGOGASTRODUODENOSCOPY (EGD) WITH PROPOFOL N/A 11/13/2016   Procedure: ESOPHAGOGASTRODUODENOSCOPY (EGD) WITH PROPOFOL;  Surgeon: Otis Brace, MD;  Location: [facility];  Service: Gastroenterology;  Laterality: N/A;   EUS N/A 02/07/2016   Procedure: ESOPHAGEAL ENDOSCOPIC ULTRASOUND (EUS) RADIAL;  Surgeon: Arta Silence, MD;  Location: [facility];  Service: Endoscopy;  Laterality: N/A;   EYE SURGERY Bilateral 2014   ioc for catracts    FOOT SURGERY Left 1990   something with toes  unsure what    HERNIA REPAIR  2018   IR EMBO TUMOR ORGAN ISCHEMIA INFARCT INC GUIDE ROADMAPPING  08/22/2021   IR RADIOLOGIST EVAL & MGMT  05/18/2021   IR RADIOLOGIST EVAL & MGMT  10/01/2021   IR RADIOLOGIST EVAL & MGMT  11/12/2021   IR RADIOLOGIST EVAL & MGMT  11/22/2021   IR RADIOLOGIST EVAL & MGMT  11/27/2021   IR RENAL SUPRASEL UNI S&I MOD SED   08/22/2021   IR US GUIDE VASC ACCESS RIGHT  08/22/2021   LEFT HEART CATH AND CORONARY ANGIOGRAPHY N/A 08/27/2021   Procedure: LEFT HEART CATH AND CORONARY ANGIOGRAPHY;  Surgeon: Burnell Blanks, MD;  Location: [place] [facility];  Service: Cardiovascular;  Laterality: N/A;   PARATHYROID EXPLORATION     PERIPHERAL VASCULAR BALLOON ANGIOPLASTY Left 08/30/2021   Procedure: PERIPHERAL VASCULAR BALLOON ANGIOPLASTY;  Surgeon: Marty Heck, MD;  Location: [place] [facility];  Service: Cardiovascular;  Laterality: Left;  arm fistula   RADIOLOGY WITH ANESTHESIA Left 08/22/2021   Procedure: MICROWAVE ABLATION;  Surgeon: Sandi Mariscal, MD;  Location: [facility];  Service: Anesthesiology;  Laterality: Left;   WHIPPLE PROCEDURE N/A 04/23/2016   Procedure: WHIPPLE PROCEDURE;  Surgeon: Stark Klein, MD;  Location: [place];  Service: General;  Laterality: N/A;    Allergies: Lopid [gemfibrozil], Lotemax [loteprednol etabonate], Statins, Norco [hydrocodone-acetaminophen], Other, Tomato, Vibra-tab [doxycycline], Codeine, and Hydrocodone-acetaminophen  Medications: Prior to Admission medications   Medication Sig Start Date End Date Taking? Authorizing Provider  acetaminophen (TYLENOL 8 HOUR ARTHRITIS PAIN) 650 MG CR tablet Take 1,300 mg by mouth daily as needed for pain.    [provider]  amiodarone (PACERONE) 200 MG tablet Take 0.5 tablets (100 mg total) by mouth daily. Please keep appointment for further refills 11/23/21   [place], Peter M, MD  apixaban (ELIQUIS) 2.5 MG TABS tablet Take 1 tablet (2.5 mg total) by mouth 2 (two) times daily. 09/25/21 03/24/22  [place], Peter M, MD  carvedilol (COREG) 3.125 MG tablet Take 1 tablet (3.125 mg total) by mouth 2 (two) times daily with a meal. Take on the days you do not have dialysis 11/04/21   Mercy Riding, MD  Cholecalciferol (VITAMIN D-3 PO) Take 1 capsule by mouth every Monday, Wednesday, and Friday with hemodialysis.    [provider]  colestipol  (COLESTID) 1 g tablet Take 1 g by mouth See admin instructions. 1 tablet (1g) prior to dialysis M-W-F + 1 tablet daily PRN diarrhea    [provider]  diclofenac sodium (VOLTAREN) 1 % GEL Apply 2 g topically 4 (four) times daily as needed (for hand pain).  Patient not taking: Reported on 11/23/2021    [provider]  insulin aspart (NOVOLOG FLEXPEN) 100 UNIT/ML FlexPen Inject 10 Units into the skin 3 (three) times daily as needed (CBG > 200).    [provider]  lidocaine-prilocaine (EMLA) cream Apply 1 Application topically every Monday, Wednesday, and Friday with hemodialysis.    [provider]  midodrine (PROAMATINE) 10 MG tablet Take 10 mg by mouth as directed. 09/27/21   [provider]  polyethylene glycol powder (MIRALAX) 17 GM/SCOOP powder Take 17 g by mouth 2 (two) times daily as needed for moderate constipation. Patient not taking: Reported on 11/23/2021 11/04/21   Mercy Riding, MD  RESTASIS 0.05 % ophthalmic emulsion 1 drop 2 (two) times daily. 11/06/21   [provider]  senna-docusate (SENOKOT-S) 8.6-50 MG tablet Take 1 tablet by mouth 2 (two)  times daily between meals as needed for mild constipation. Patient not taking: Reported on 11/23/2021 11/04/21   Mercy Riding, MD  sevelamer carbonate (RENVELA) 800 MG tablet Take 1,600 mg by mouth 3 (three) times daily with meals.    [provider]  sodium chloride flush (NS) 0.9 % SOLN 5 mLs by Intracatheter route every 8 (eight) hours. Patient not taking: Reported on 11/23/2021 11/04/21   Mercy Riding, MD  XIIDRA 5 % SOLN Place 1 drop into both eyes at bedtime. Patient not taking: Reported on 11/23/2021 09/24/21   [provider]     Family History  Problem Relation Age of Onset   Heart disease Mother    Stroke Mother    Cancer Father        colon   Alzheimer's disease Sister    Alzheimer's disease Sister    Cancer Brother        brain   Breast cancer Neg Hx      Social History   Socioeconomic History   Marital status: Widowed    Spouse name: Not on file   Number of children: Not on file   Years of education: Not on file   Highest education level: Not on file  Occupational History   Not on file  Tobacco Use   Smoking status: Never   Smokeless tobacco: Never  Vaping Use   Vaping Use: Never used  Substance and Sexual Activity   Alcohol use: No   Drug use: No   Sexual activity: Not on file  Other Topics Concern   Not on file  Social History Narrative   Not on file   Social Determinants of Health   Financial Resource Strain: Not on file  Food Insecurity: No Food Insecurity (11/01/2021)   Hunger Vital Sign    Worried About Running Out of Food in the Last Year: Never true    Ran Out of Food in the Last Year: Never true  Transportation Needs: No Transportation Needs (11/01/2021)   PRAPARE - Hydrologist (Medical): No    Lack of Transportation (Non-Medical): No  Physical Activity: Not on file  Stress: Not on file  Social Connections: Not on file    ECOG Status: 2 - Symptomatic, <50% confined to bed  Review of Systems: A 12 point ROS discussed and pertinent positives are indicated in the HPI above.  All other systems are negative.  Review of Systems  Vital Signs: There were no vitals taken for this visit.  Physical Exam   Imaging:  Multiple previous drainage catheter injections, most recently on 11/27/2021 CT abdomen and pelvis-11/12/2021; 10/29/2021  CT-guided percutaneous catheter placement-11/02/2021   DG Sinus/Fist Tube Chk-Non GI  Result Date: 11/27/2021 INDICATION: 86 year old with history of left renal cell carcinoma and status post endovascular embolization and microwave ablation on 08/22/2021. Patient developed a delayed urinary leak requiring a left ureter stent and left perinephric drainage catheter. Drainage catheter was placed on 11/03/2011. Recent drain injection demonstrates a  persistent communication between the decompressed urinoma and the left kidney collecting system. Patient presents for follow-up imaging because she did not have drainage from the tube for approximately 3 days. However, the patient now has a large amount of fluid within the drainage bag. EXAM: DRAIN INJECTION WITH FLUOROSCOPY MEDICATIONS: None ANESTHESIA/SEDATION: None FLUOROSCOPY TIME:  Radiation Exposure Index (as provided by the fluoroscopic device): 8.676 mGy Kerma COMPLICATIONS: None immediate. CONTRAST:  7 mL Omnipaque 350 PROCEDURE: Patient was placed prone.  Left perinephric drain was injected under fluoroscopic examination. No drain was flushed with 4 mL sterile saline and attached to a gravity bag. FINDINGS: Left perinephric drain is unchanged in position. Contrast injection immediately demonstrates filling of a lower pole calyx and contrast fills the renal pelvis. Patient has a ureter stent with the proximal aspect in an upper pole calyx. Contrast fills the ureter stent. There is no significant collection around the drain itself. Again noted are embolization coils in the left kidney. IMPRESSION: 1. Persistent connection between the decompressed urinoma and the left kidney collecting system. 2. The tube was not draining for approximately 3 days and suspect this was related to an obstruction within the tube. Instructed the patient and her son to flush the tube with 5 mL of sterile saline if the tube stops draining in the future to make sure that the tube is not clogged. Patient has scheduled follow-up on 12/20/2021. Electronically Signed   By: Markus Daft M.D.   On: 11/27/2021 12:25   IR Radiologist Eval & Mgmt  Result Date: 11/27/2021 Please refer to notes tab for details about interventional procedure. (Op Note)  DG Sinus/Fist Tube Chk-Non GI  Result Date: 11/22/2021 CLINICAL DATA:  History of left-sided renal cell carcinoma, post percutaneous coil embolization and microwave ablation performed  16/11/9602, complicated by development of a delayed urinary leak, requiring placement of a left-sided double-J ureteral stent on 10/21/2021 as well as a left-sided perinephric drainage catheter 11/02/2021. Subsequent abdominal CT performed 11/12/2021 demonstrates resolution of the previously identified perinephric urinoma however drainage catheter injection performed later that same date demonstrated communication between the decompressed urinoma and an inferior pole calyx. Patient returns to the interventional radiology drain clinic for drainage catheter evaluation and management. Patient reports intermittently bloody output from the left-sided perinephric drainage catheter with volumes of between 200 - 400 cc per day. She is completed her prescribed course of intravenous antibiotics. She denies worsening pain, fever or chills. EXAM: ABSCESS INJECTION COMPARISON:  CT abdomen and pelvis-11/12/2021; 10/29/2021 CT-guided percutaneous drainage catheter placement-11/02/2021 Intraoperative images during left-sided double-J ureteral stent placement-10/31/2021 CONTRAST:  10 cc Isovue-300-administered via the existing percutaneous drain. FLUOROSCOPY: 36 seconds (5.9 mGy) TECHNIQUE: The patient was positioned prone on the fluoroscopy table. A preprocedural spot fluoroscopic image was obtained of the left flank and the existing percutaneous drainage catheter. Multiple spot fluoroscopic and radiographic images were obtained following the injection of a small amount of contrast via the existing percutaneous drainage catheter. Images were reviewed and discussed with the patient and the patient's son. The drainage catheter was flushed with a small amount of saline and connected to a gravity bag. Dressings were applied. The patient tolerated the procedure well without immediate postprocedural complication. FINDINGS: Preprocedural spot fluoroscopic image demonstrates stable positioning of left-sided perinephric drainage catheter  with end coiled and locked adjacent to the expected location of the inferior pole the left kidney. The superior aspect of the left-sided double-J ureteral stent overlies expected location of the superior aspect of the left renal collecting system. Embolization coils overlie the expected location of the mid/inferior pole of the left kidney Contrast injection demonstrates opacification of the decompressed urinoma with communication between the urinoma and an inferior pole calyx. There is once again opacification of the left renal collecting system with passage of contrast through the ureteral stent to the level of the urinary bladder. IMPRESSION: Drain injection demonstrates persistent communication between the decompressed urinoma and an inferior pole calyx. Electronically Signed   By: Eldridge Abrahams.D.  On: 11/22/2021 14:07   IR Radiologist Eval & Mgmt  Result Date: 11/22/2021 Please refer to notes tab for details about interventional procedure. (Op Note)   Labs:  CBC: Recent Labs    10/30/21 0226 10/31/21 0748 10/31/21 1513 11/01/21 0630 11/02/21 0558  WBC 11.5* 11.7*  --  8.4 8.5  HGB 8.0* 7.7* 9.9* 7.8* 8.0*  HCT 26.3* 25.5* 29.0* 25.2* 25.4*  PLT 326 331  --  337 356    COAGS: Recent Labs    08/22/21 0740 09/05/21 2115 10/29/21 1709 11/01/21 0630  INR 0.9 1.3* 1.4* 1.2    BMP: Recent Labs    10/30/21 0226 10/31/21 0748 10/31/21 1513 11/01/21 0630 11/02/21 0558  NA 139 137 136 137 137  K 4.2 4.5 4.5 4.4 4.5  CL 99 100 96* 99 95*  CO2 27 24  --  27 24  GLUCOSE 271* 172* 130* 136* 172*  BUN 29* 46* 18 25* 45*  CALCIUM 8.7* 8.9  --  8.6* 8.7*  CREATININE 4.11* 5.41* 2.70* 4.29* 5.77*  GFRNONAA 10* 7*  --  10* 7*    LIVER FUNCTION TESTS: Recent Labs    09/19/21 1825 10/25/21 1056 10/29/21 1709 10/30/21 0226 10/31/21 0748 11/01/21 0630 11/02/21 0558  BILITOT 0.6 0.3 0.6 0.7  --   --   --   AST 34 '15 26 15  '$ --   --   --   ALT 42 '24 19 16  '$ --   --   --    ALKPHOS 89 98 67 62  --   --   --   PROT 6.4* 6.7 6.6 6.3*  --   --   --   ALBUMIN 2.7* 3.5* 2.3* 2.2* 2.0* 2.1* 2.2*    TUMOR MARKERS: No results for input(s): "AFPTM", "CEA", "CA199", "CHROMGRNA" in the last 8760 hours.  Assessment and Plan:  86 year old with history of left-sided renal cell carcinoma, post endovascular embolization and microwave ablation on 08/22/2021.  Patient developed a delayed urinary leak requiring left ureteral stent placement on 10/21/2021 and ultimately placement of a perinephric drainage catheter on 11/02/2021.  Subsequent CT scan of the abdomen pelvis performed 11/12/2021 demonstrated resolution of previously identified perinephric urinoma, however subsequent drainage catheter injections have demonstrated persistent communication between the decompressed urinoma and an inferior pole renal calyx.  Patient returns to the interventional radiology drain clinic for drainage catheter evaluation and management.  Patient continues to report approximately 400 cc urine from the drainage catheter per day.    Drain injection confirms persistent communication between the decompressed urinoma and an inferior pole renal calyx.  Despite the patient having end-stage renal disease and on dialysis, she continues to experience approximately 400 cc of urine output from the percutaneous drainage catheter per day as the communication between the decompressed urinoma and the inferior pole calyx persists despite 6 weeks of percutaneous management.   I explained my concern that this leak is likely to be chronic in etiology with challenging treatment options as the patient is a poor operative candidate given her multiple comorbidities and her history of previous Whipple procedure.   I explained that we could consider management of a chronic percutaneous drainage catheter with routine fluoroscopic guided exchanges versus proceeding with a more involved intervention in the hopes of sealing  the now chronic urine leak.   Following this prolonged the detailed conversation, the patient wishes to pursue an image guided attempt at sealing the urine leak.   As such, I will be having  detailed conversations with my interventional radiology, Dr. Laurence Ferrari, as well as referring urologist, Dr. Louis Meckel, for consideration of potential treatment options.   PLAN:  - I will arrange for the patient to have a follow-up telemedicine consult to discuss the decided upon treatment plan for percutaneous attempt at urine leak closure following my consultations with Drs. Laurence Ferrari and [place]. (Note, the patient is going to be traveling for the remainder of the month and not consistently in the area until December).  - In the meantime, the patient and the patient's son were instructed to continue their excellent care of the drain but no longer have to maintain records regarding drain out put.  Again IF the drain puts out less than 10 cc/day for 3 consecutive days, they know to call the IR clinic as this could indicate the urine leak has healed.    Thank you for this interesting consult.  I greatly enjoyed meeting ELMIRE AMREIN and look forward to participating in their care.  A copy of this report was sent to the requesting provider on this date.  Electronically Signed: Sandi Mariscal 12/20/2021, 12:03 PM   I spent a total of 25 Minutes in face to face in clinical consultation, greater than 50% of which was counseling/coordinating care for drainage catheter evaluation and management.

## 2021-12-21 DIAGNOSIS — L299 Pruritus, unspecified: Secondary | ICD-10-CM | POA: Diagnosis not present

## 2021-12-21 DIAGNOSIS — Z992 Dependence on renal dialysis: Secondary | ICD-10-CM | POA: Diagnosis not present

## 2021-12-21 DIAGNOSIS — N186 End stage renal disease: Secondary | ICD-10-CM | POA: Diagnosis not present

## 2021-12-21 DIAGNOSIS — N2581 Secondary hyperparathyroidism of renal origin: Secondary | ICD-10-CM | POA: Diagnosis not present

## 2021-12-24 DIAGNOSIS — Z992 Dependence on renal dialysis: Secondary | ICD-10-CM | POA: Diagnosis not present

## 2021-12-24 DIAGNOSIS — N186 End stage renal disease: Secondary | ICD-10-CM | POA: Diagnosis not present

## 2021-12-24 DIAGNOSIS — N2581 Secondary hyperparathyroidism of renal origin: Secondary | ICD-10-CM | POA: Diagnosis not present

## 2021-12-26 ENCOUNTER — Ambulatory Visit: Payer: Medicare PPO | Admitting: Cardiology

## 2021-12-26 DIAGNOSIS — N2581 Secondary hyperparathyroidism of renal origin: Secondary | ICD-10-CM | POA: Diagnosis not present

## 2021-12-26 DIAGNOSIS — N186 End stage renal disease: Secondary | ICD-10-CM | POA: Diagnosis not present

## 2021-12-26 DIAGNOSIS — Z992 Dependence on renal dialysis: Secondary | ICD-10-CM | POA: Diagnosis not present

## 2021-12-29 DIAGNOSIS — N186 End stage renal disease: Secondary | ICD-10-CM | POA: Diagnosis not present

## 2021-12-29 DIAGNOSIS — N2581 Secondary hyperparathyroidism of renal origin: Secondary | ICD-10-CM | POA: Diagnosis not present

## 2021-12-29 DIAGNOSIS — L299 Pruritus, unspecified: Secondary | ICD-10-CM | POA: Diagnosis not present

## 2021-12-29 DIAGNOSIS — Z992 Dependence on renal dialysis: Secondary | ICD-10-CM | POA: Diagnosis not present

## 2021-12-31 DIAGNOSIS — N186 End stage renal disease: Secondary | ICD-10-CM | POA: Diagnosis not present

## 2021-12-31 DIAGNOSIS — Z992 Dependence on renal dialysis: Secondary | ICD-10-CM | POA: Diagnosis not present

## 2022-01-02 DIAGNOSIS — Z992 Dependence on renal dialysis: Secondary | ICD-10-CM | POA: Diagnosis not present

## 2022-01-02 DIAGNOSIS — N186 End stage renal disease: Secondary | ICD-10-CM | POA: Diagnosis not present

## 2022-01-04 DIAGNOSIS — N186 End stage renal disease: Secondary | ICD-10-CM | POA: Diagnosis not present

## 2022-01-04 DIAGNOSIS — Z992 Dependence on renal dialysis: Secondary | ICD-10-CM | POA: Diagnosis not present

## 2022-01-06 DIAGNOSIS — Z992 Dependence on renal dialysis: Secondary | ICD-10-CM | POA: Diagnosis not present

## 2022-01-06 DIAGNOSIS — N186 End stage renal disease: Secondary | ICD-10-CM | POA: Diagnosis not present

## 2022-01-08 DIAGNOSIS — Z992 Dependence on renal dialysis: Secondary | ICD-10-CM | POA: Diagnosis not present

## 2022-01-08 DIAGNOSIS — N186 End stage renal disease: Secondary | ICD-10-CM | POA: Diagnosis not present

## 2022-01-11 DIAGNOSIS — Z992 Dependence on renal dialysis: Secondary | ICD-10-CM | POA: Diagnosis not present

## 2022-01-11 DIAGNOSIS — N186 End stage renal disease: Secondary | ICD-10-CM | POA: Diagnosis not present

## 2022-01-14 DIAGNOSIS — Z992 Dependence on renal dialysis: Secondary | ICD-10-CM | POA: Diagnosis not present

## 2022-01-14 DIAGNOSIS — N186 End stage renal disease: Secondary | ICD-10-CM | POA: Diagnosis not present

## 2022-01-16 DIAGNOSIS — N186 End stage renal disease: Secondary | ICD-10-CM | POA: Diagnosis not present

## 2022-01-16 DIAGNOSIS — Z992 Dependence on renal dialysis: Secondary | ICD-10-CM | POA: Diagnosis not present

## 2022-01-17 ENCOUNTER — Other Ambulatory Visit: Payer: Self-pay | Admitting: Interventional Radiology

## 2022-01-17 DIAGNOSIS — Z992 Dependence on renal dialysis: Secondary | ICD-10-CM | POA: Diagnosis not present

## 2022-01-17 DIAGNOSIS — N2889 Other specified disorders of kidney and ureter: Secondary | ICD-10-CM

## 2022-01-17 DIAGNOSIS — N186 End stage renal disease: Secondary | ICD-10-CM | POA: Diagnosis not present

## 2022-01-18 DIAGNOSIS — N186 End stage renal disease: Secondary | ICD-10-CM | POA: Diagnosis not present

## 2022-01-18 DIAGNOSIS — Z992 Dependence on renal dialysis: Secondary | ICD-10-CM | POA: Diagnosis not present

## 2022-01-21 ENCOUNTER — Emergency Department (HOSPITAL_COMMUNITY): Payer: Medicare PPO

## 2022-01-21 ENCOUNTER — Inpatient Hospital Stay (HOSPITAL_COMMUNITY)
Admission: EM | Admit: 2022-01-21 | Discharge: 2022-01-31 | DRG: 698 | Disposition: A | Discharge status: Skilled Nursing Facility | Payer: Medicare PPO | Attending: Internal Medicine | Admitting: Internal Medicine

## 2022-01-21 ENCOUNTER — Other Ambulatory Visit: Payer: Self-pay

## 2022-01-21 DIAGNOSIS — Z79899 Other long term (current) drug therapy: Secondary | ICD-10-CM | POA: Diagnosis not present

## 2022-01-21 DIAGNOSIS — Z1152 Encounter for screening for COVID-19: Secondary | ICD-10-CM | POA: Diagnosis not present

## 2022-01-21 DIAGNOSIS — I2510 Atherosclerotic heart disease of native coronary artery without angina pectoris: Secondary | ICD-10-CM | POA: Diagnosis present

## 2022-01-21 DIAGNOSIS — I132 Hypertensive heart and chronic kidney disease with heart failure and with stage 5 chronic kidney disease, or end stage renal disease: Secondary | ICD-10-CM | POA: Diagnosis present

## 2022-01-21 DIAGNOSIS — N179 Acute kidney failure, unspecified: Secondary | ICD-10-CM | POA: Diagnosis not present

## 2022-01-21 DIAGNOSIS — E119 Type 2 diabetes mellitus without complications: Secondary | ICD-10-CM

## 2022-01-21 DIAGNOSIS — Z881 Allergy status to other antibiotic agents status: Secondary | ICD-10-CM

## 2022-01-21 DIAGNOSIS — E1165 Type 2 diabetes mellitus with hyperglycemia: Secondary | ICD-10-CM | POA: Diagnosis not present

## 2022-01-21 DIAGNOSIS — Z9049 Acquired absence of other specified parts of digestive tract: Secondary | ICD-10-CM

## 2022-01-21 DIAGNOSIS — N281 Cyst of kidney, acquired: Secondary | ICD-10-CM | POA: Diagnosis not present

## 2022-01-21 DIAGNOSIS — A419 Sepsis, unspecified organism: Secondary | ICD-10-CM | POA: Diagnosis present

## 2022-01-21 DIAGNOSIS — R319 Hematuria, unspecified: Secondary | ICD-10-CM | POA: Diagnosis not present

## 2022-01-21 DIAGNOSIS — E139 Other specified diabetes mellitus without complications: Secondary | ICD-10-CM

## 2022-01-21 DIAGNOSIS — K5900 Constipation, unspecified: Secondary | ICD-10-CM | POA: Diagnosis not present

## 2022-01-21 DIAGNOSIS — Z8673 Personal history of transient ischemic attack (TIA), and cerebral infarction without residual deficits: Secondary | ICD-10-CM

## 2022-01-21 DIAGNOSIS — D6869 Other thrombophilia: Secondary | ICD-10-CM | POA: Diagnosis present

## 2022-01-21 DIAGNOSIS — Z7901 Long term (current) use of anticoagulants: Secondary | ICD-10-CM

## 2022-01-21 DIAGNOSIS — R4182 Altered mental status, unspecified: Secondary | ICD-10-CM | POA: Diagnosis not present

## 2022-01-21 DIAGNOSIS — K3189 Other diseases of stomach and duodenum: Secondary | ICD-10-CM | POA: Diagnosis not present

## 2022-01-21 DIAGNOSIS — K8689 Other specified diseases of pancreas: Secondary | ICD-10-CM | POA: Diagnosis not present

## 2022-01-21 DIAGNOSIS — I5032 Chronic diastolic (congestive) heart failure: Secondary | ICD-10-CM | POA: Diagnosis present

## 2022-01-21 DIAGNOSIS — T1490XA Injury, unspecified, initial encounter: Secondary | ICD-10-CM | POA: Diagnosis present

## 2022-01-21 DIAGNOSIS — E891 Postprocedural hypoinsulinemia: Secondary | ICD-10-CM | POA: Diagnosis present

## 2022-01-21 DIAGNOSIS — Z91018 Allergy to other foods: Secondary | ICD-10-CM

## 2022-01-21 DIAGNOSIS — R0902 Hypoxemia: Secondary | ICD-10-CM | POA: Diagnosis not present

## 2022-01-21 DIAGNOSIS — D62 Acute posthemorrhagic anemia: Secondary | ICD-10-CM | POA: Diagnosis present

## 2022-01-21 DIAGNOSIS — Z992 Dependence on renal dialysis: Secondary | ICD-10-CM | POA: Diagnosis not present

## 2022-01-21 DIAGNOSIS — Z885 Allergy status to narcotic agent status: Secondary | ICD-10-CM

## 2022-01-21 DIAGNOSIS — E1365 Other specified diabetes mellitus with hyperglycemia: Secondary | ICD-10-CM | POA: Diagnosis present

## 2022-01-21 DIAGNOSIS — K219 Gastro-esophageal reflux disease without esophagitis: Secondary | ICD-10-CM | POA: Diagnosis not present

## 2022-01-21 DIAGNOSIS — D631 Anemia in chronic kidney disease: Secondary | ICD-10-CM | POA: Diagnosis present

## 2022-01-21 DIAGNOSIS — S37012A Minor contusion of left kidney, initial encounter: Secondary | ICD-10-CM | POA: Diagnosis not present

## 2022-01-21 DIAGNOSIS — E872 Acidosis, unspecified: Secondary | ICD-10-CM | POA: Diagnosis present

## 2022-01-21 DIAGNOSIS — I10 Essential (primary) hypertension: Secondary | ICD-10-CM | POA: Diagnosis not present

## 2022-01-21 DIAGNOSIS — R739 Hyperglycemia, unspecified: Secondary | ICD-10-CM

## 2022-01-21 DIAGNOSIS — Z90710 Acquired absence of both cervix and uterus: Secondary | ICD-10-CM

## 2022-01-21 DIAGNOSIS — M546 Pain in thoracic spine: Secondary | ICD-10-CM

## 2022-01-21 DIAGNOSIS — Z90410 Acquired total absence of pancreas: Secondary | ICD-10-CM

## 2022-01-21 DIAGNOSIS — I120 Hypertensive chronic kidney disease with stage 5 chronic kidney disease or end stage renal disease: Secondary | ICD-10-CM | POA: Diagnosis not present

## 2022-01-21 DIAGNOSIS — R0682 Tachypnea, not elsewhere classified: Secondary | ICD-10-CM | POA: Diagnosis not present

## 2022-01-21 DIAGNOSIS — S37022A Major contusion of left kidney, initial encounter: Secondary | ICD-10-CM | POA: Diagnosis present

## 2022-01-21 DIAGNOSIS — R109 Unspecified abdominal pain: Secondary | ICD-10-CM | POA: Diagnosis not present

## 2022-01-21 DIAGNOSIS — E1322 Other specified diabetes mellitus with diabetic chronic kidney disease: Secondary | ICD-10-CM | POA: Diagnosis present

## 2022-01-21 DIAGNOSIS — S37012D Minor contusion of left kidney, subsequent encounter: Secondary | ICD-10-CM | POA: Diagnosis not present

## 2022-01-21 DIAGNOSIS — I700 Atherosclerosis of aorta: Secondary | ICD-10-CM | POA: Diagnosis not present

## 2022-01-21 DIAGNOSIS — R54 Age-related physical debility: Secondary | ICD-10-CM | POA: Diagnosis present

## 2022-01-21 DIAGNOSIS — M898X9 Other specified disorders of bone, unspecified site: Secondary | ICD-10-CM | POA: Diagnosis present

## 2022-01-21 DIAGNOSIS — I953 Hypotension of hemodialysis: Secondary | ICD-10-CM | POA: Diagnosis not present

## 2022-01-21 DIAGNOSIS — N2581 Secondary hyperparathyroidism of renal origin: Secondary | ICD-10-CM | POA: Diagnosis present

## 2022-01-21 DIAGNOSIS — M549 Dorsalgia, unspecified: Secondary | ICD-10-CM | POA: Diagnosis not present

## 2022-01-21 DIAGNOSIS — Z8249 Family history of ischemic heart disease and other diseases of the circulatory system: Secondary | ICD-10-CM

## 2022-01-21 DIAGNOSIS — Z936 Other artificial openings of urinary tract status: Secondary | ICD-10-CM

## 2022-01-21 DIAGNOSIS — N186 End stage renal disease: Secondary | ICD-10-CM | POA: Diagnosis present

## 2022-01-21 DIAGNOSIS — I480 Paroxysmal atrial fibrillation: Secondary | ICD-10-CM | POA: Diagnosis present

## 2022-01-21 DIAGNOSIS — N390 Urinary tract infection, site not specified: Secondary | ICD-10-CM | POA: Diagnosis not present

## 2022-01-21 DIAGNOSIS — N250 Renal osteodystrophy: Secondary | ICD-10-CM | POA: Diagnosis not present

## 2022-01-21 DIAGNOSIS — J9811 Atelectasis: Secondary | ICD-10-CM | POA: Diagnosis not present

## 2022-01-21 DIAGNOSIS — Z794 Long term (current) use of insulin: Secondary | ICD-10-CM

## 2022-01-21 DIAGNOSIS — N151 Renal and perinephric abscess: Secondary | ICD-10-CM | POA: Diagnosis not present

## 2022-01-21 DIAGNOSIS — Z85528 Personal history of other malignant neoplasm of kidney: Secondary | ICD-10-CM

## 2022-01-21 DIAGNOSIS — Z87442 Personal history of urinary calculi: Secondary | ICD-10-CM

## 2022-01-21 DIAGNOSIS — I2489 Other forms of acute ischemic heart disease: Secondary | ICD-10-CM | POA: Diagnosis present

## 2022-01-21 DIAGNOSIS — Z888 Allergy status to other drugs, medicaments and biological substances status: Secondary | ICD-10-CM

## 2022-01-21 LAB — CBC WITH DIFFERENTIAL/PLATELET
Abs Immature Granulocytes: 0.26 10*3/uL — ABNORMAL HIGH (ref 0.00–0.07)
Basophils Absolute: 0.1 10*3/uL (ref 0.0–0.1)
Basophils Relative: 0 %
Eosinophils Absolute: 0.1 10*3/uL (ref 0.0–0.5)
Eosinophils Relative: 1 %
HCT: 29.5 % — ABNORMAL LOW (ref 36.0–46.0)
Hemoglobin: 9 g/dL — ABNORMAL LOW (ref 12.0–15.0)
Immature Granulocytes: 1 %
Lymphocytes Relative: 11 %
Lymphs Abs: 2.7 10*3/uL (ref 0.7–4.0)
MCH: 27.9 pg (ref 26.0–34.0)
MCHC: 30.5 g/dL (ref 30.0–36.0)
MCV: 91.3 fL (ref 80.0–100.0)
Monocytes Absolute: 0.6 10*3/uL (ref 0.1–1.0)
Monocytes Relative: 3 %
Neutro Abs: 19.8 10*3/uL — ABNORMAL HIGH (ref 1.7–7.7)
Neutrophils Relative %: 84 %
Platelets: 325 10*3/uL (ref 150–400)
RBC: 3.23 MIL/uL — ABNORMAL LOW (ref 3.87–5.11)
RDW: 18.8 % — ABNORMAL HIGH (ref 11.5–15.5)
WBC: 23.6 10*3/uL — ABNORMAL HIGH (ref 4.0–10.5)
nRBC: 0 % (ref 0.0–0.2)

## 2022-01-21 LAB — COMPREHENSIVE METABOLIC PANEL
ALT: 52 U/L — ABNORMAL HIGH (ref 0–44)
AST: 37 U/L (ref 15–41)
Albumin: 2.4 g/dL — ABNORMAL LOW (ref 3.5–5.0)
Alkaline Phosphatase: 55 U/L (ref 38–126)
Anion gap: 13 (ref 5–15)
BUN: 59 mg/dL — ABNORMAL HIGH (ref 8–23)
CO2: 19 mmol/L — ABNORMAL LOW (ref 22–32)
Calcium: 8.7 mg/dL — ABNORMAL LOW (ref 8.9–10.3)
Chloride: 104 mmol/L (ref 98–111)
Creatinine, Ser: 6.46 mg/dL — ABNORMAL HIGH (ref 0.44–1.00)
GFR, Estimated: 6 mL/min — ABNORMAL LOW (ref >60)
Glucose, Bld: 378 mg/dL — ABNORMAL HIGH (ref 70–99)
Potassium: 4.3 mmol/L (ref 3.5–5.1)
Sodium: 136 mmol/L (ref 135–145)
Total Bilirubin: 0.4 mg/dL (ref 0.3–1.2)
Total Protein: 5.6 g/dL — ABNORMAL LOW (ref 6.5–8.1)

## 2022-01-21 NOTE — ED Triage Notes (Signed)
Pt arrived by EMS, complaining of back pain for past 2wks, that got worse about 1hr ago   No dialysis since Friday  Has nephrostomy drain, that has had red urine off and on for about 2 wks.   '8mg'$  morphine and 500cc NS bolus, 22 gauge right hand  Fistula in left arm   160/96 88 HR 99% RA  14 RR

## 2022-01-21 NOTE — ED Provider Triage Note (Signed)
Emergency Medicine Provider Triage Evaluation Note  Kim Burns , a 86 y.o. female  was evaluated in triage.  Pt complains of severe back pain. Has left urostomy tube in place s/p resection of renal mass.  Has had blood in urine on and off today with increasing pain.  Has hx ESRD on HD, did not attend today because she did not feel well.  Review of Systems  Positive: Flank pain, hematuria Negative: fever  Physical Exam  BP 117/68   Pulse 83   Resp 13   Ht '5\' 2"'$  (1.575 m)   Wt 58 kg   SpO2 98%   BMI 23.39 kg/m   Gen:   Awake, no distress   Resp:  Normal effort  MSK:   Moves extremities without difficulty  Other:  Left nephrostomy tube in place, mildly bloody output noted Fistula LUE, thrill noted  Medical Decision Making  Medically screening exam initiated at 10:53 PM.  Appropriate orders placed.  Kim Burns was informed that the remainder of the evaluation will be completed by another provider, this initial triage assessment does not replace that evaluation, and the importance of remaining in the ED until their evaluation is complete.  Flank pain.  Has left nephrostomy tube s/p resection of renal mass.  Also has ESRD on HD, did not have treatment today.  Will check EKG, labs, CT stone study.  11:26 PM WBC count 23K.  Will add on lactate, cultures.  EKG also with new changes today.  Patient is quite somnolent, she does arouse when spoken to.  She did receive '8mg'$  morphine en route with EMS which is likely contributing.  Spoke with charge RN, will take to room for full evaluation.   Kim Pickett, PA-C 01/21/22 [phone]

## 2022-01-21 NOTE — Significant Event (Signed)
   Called to review ECG.  Patient reported to have diffuse back, abdominal and chest pains.  Relieved with morphine.  ECG shows some inferior ST changes.  Cath in 7/23 showed widely patent RCA and circumflex, 50% LAD lesion.  No PCI.  WBC > 20K.  Discussed with ER MD.  Seems that primary issue is not cardiac and likely infectious.  THis may be causing some demand ischemia, rather than true ACS given reassuring cath findings a few months ago.  Will manage medically form a cardiac standpoint for now.   Jettie Booze, MD

## 2022-01-22 ENCOUNTER — Emergency Department (HOSPITAL_COMMUNITY): Payer: Medicare PPO

## 2022-01-22 DIAGNOSIS — Z794 Long term (current) use of insulin: Secondary | ICD-10-CM | POA: Diagnosis not present

## 2022-01-22 DIAGNOSIS — I480 Paroxysmal atrial fibrillation: Secondary | ICD-10-CM | POA: Diagnosis present

## 2022-01-22 DIAGNOSIS — D62 Acute posthemorrhagic anemia: Secondary | ICD-10-CM | POA: Diagnosis present

## 2022-01-22 DIAGNOSIS — Z936 Other artificial openings of urinary tract status: Secondary | ICD-10-CM | POA: Diagnosis not present

## 2022-01-22 DIAGNOSIS — S37012A Minor contusion of left kidney, initial encounter: Secondary | ICD-10-CM

## 2022-01-22 DIAGNOSIS — R739 Hyperglycemia, unspecified: Secondary | ICD-10-CM | POA: Diagnosis present

## 2022-01-22 DIAGNOSIS — E891 Postprocedural hypoinsulinemia: Secondary | ICD-10-CM | POA: Diagnosis present

## 2022-01-22 DIAGNOSIS — N390 Urinary tract infection, site not specified: Secondary | ICD-10-CM | POA: Diagnosis not present

## 2022-01-22 DIAGNOSIS — Z7901 Long term (current) use of anticoagulants: Secondary | ICD-10-CM | POA: Diagnosis not present

## 2022-01-22 DIAGNOSIS — E872 Acidosis, unspecified: Secondary | ICD-10-CM | POA: Diagnosis present

## 2022-01-22 DIAGNOSIS — I953 Hypotension of hemodialysis: Secondary | ICD-10-CM | POA: Diagnosis not present

## 2022-01-22 DIAGNOSIS — Z1152 Encounter for screening for COVID-19: Secondary | ICD-10-CM | POA: Diagnosis not present

## 2022-01-22 DIAGNOSIS — D6869 Other thrombophilia: Secondary | ICD-10-CM | POA: Diagnosis present

## 2022-01-22 DIAGNOSIS — Z79899 Other long term (current) drug therapy: Secondary | ICD-10-CM | POA: Diagnosis not present

## 2022-01-22 DIAGNOSIS — E1365 Other specified diabetes mellitus with hyperglycemia: Secondary | ICD-10-CM | POA: Diagnosis present

## 2022-01-22 DIAGNOSIS — K5900 Constipation, unspecified: Secondary | ICD-10-CM | POA: Diagnosis not present

## 2022-01-22 DIAGNOSIS — A419 Sepsis, unspecified organism: Secondary | ICD-10-CM | POA: Diagnosis present

## 2022-01-22 DIAGNOSIS — I132 Hypertensive heart and chronic kidney disease with heart failure and with stage 5 chronic kidney disease, or end stage renal disease: Secondary | ICD-10-CM | POA: Diagnosis present

## 2022-01-22 DIAGNOSIS — E1322 Other specified diabetes mellitus with diabetic chronic kidney disease: Secondary | ICD-10-CM | POA: Diagnosis present

## 2022-01-22 DIAGNOSIS — Z992 Dependence on renal dialysis: Secondary | ICD-10-CM | POA: Diagnosis not present

## 2022-01-22 DIAGNOSIS — D631 Anemia in chronic kidney disease: Secondary | ICD-10-CM | POA: Diagnosis present

## 2022-01-22 DIAGNOSIS — N2581 Secondary hyperparathyroidism of renal origin: Secondary | ICD-10-CM | POA: Diagnosis present

## 2022-01-22 DIAGNOSIS — S37012D Minor contusion of left kidney, subsequent encounter: Secondary | ICD-10-CM | POA: Diagnosis not present

## 2022-01-22 DIAGNOSIS — N186 End stage renal disease: Secondary | ICD-10-CM | POA: Diagnosis present

## 2022-01-22 DIAGNOSIS — I2510 Atherosclerotic heart disease of native coronary artery without angina pectoris: Secondary | ICD-10-CM | POA: Diagnosis present

## 2022-01-22 DIAGNOSIS — I5032 Chronic diastolic (congestive) heart failure: Secondary | ICD-10-CM | POA: Diagnosis present

## 2022-01-22 DIAGNOSIS — I2489 Other forms of acute ischemic heart disease: Secondary | ICD-10-CM | POA: Diagnosis present

## 2022-01-22 DIAGNOSIS — S37022A Major contusion of left kidney, initial encounter: Secondary | ICD-10-CM | POA: Diagnosis present

## 2022-01-22 LAB — APTT: aPTT: 26 seconds (ref 24–36)

## 2022-01-22 LAB — I-STAT VENOUS BLOOD GAS, ED
Acid-base deficit: 7 mmol/L — ABNORMAL HIGH (ref 0.0–2.0)
Bicarbonate: 20.8 mmol/L (ref 20.0–28.0)
Calcium, Ion: 1.23 mmol/L (ref 1.15–1.40)
HCT: 22 % — ABNORMAL LOW (ref 36.0–46.0)
Hemoglobin: 7.5 g/dL — ABNORMAL LOW (ref 12.0–15.0)
O2 Saturation: 87 %
Potassium: 4.8 mmol/L (ref 3.5–5.1)
Sodium: 138 mmol/L (ref 135–145)
TCO2: 22 mmol/L (ref 22–32)
pCO2, Ven: 50.7 mmHg (ref 44–60)
pH, Ven: 7.22 — ABNORMAL LOW (ref 7.25–7.43)
pO2, Ven: 63 mmHg — ABNORMAL HIGH (ref 32–45)

## 2022-01-22 LAB — LACTIC ACID, PLASMA
Lactic Acid, Venous: 3 mmol/L (ref 0.5–1.9)
Lactic Acid, Venous: 3.5 mmol/L (ref 0.5–1.9)
Lactic Acid, Venous: 4.3 mmol/L (ref 0.5–1.9)
Lactic Acid, Venous: 5 mmol/L (ref 0.5–1.9)
Lactic Acid, Venous: 5.4 mmol/L (ref 0.5–1.9)
Lactic Acid, Venous: 5.7 mmol/L (ref 0.5–1.9)

## 2022-01-22 LAB — COMPREHENSIVE METABOLIC PANEL
ALT: 47 U/L — ABNORMAL HIGH (ref 0–44)
AST: 28 U/L (ref 15–41)
Albumin: 2.3 g/dL — ABNORMAL LOW (ref 3.5–5.0)
Alkaline Phosphatase: 45 U/L (ref 38–126)
Anion gap: 13 (ref 5–15)
BUN: 62 mg/dL — ABNORMAL HIGH (ref 8–23)
CO2: 20 mmol/L — ABNORMAL LOW (ref 22–32)
Calcium: 9 mg/dL (ref 8.9–10.3)
Chloride: 105 mmol/L (ref 98–111)
Creatinine, Ser: 6.7 mg/dL — ABNORMAL HIGH (ref 0.44–1.00)
GFR, Estimated: 6 mL/min — ABNORMAL LOW (ref >60)
Glucose, Bld: 379 mg/dL — ABNORMAL HIGH (ref 70–99)
Potassium: 5.4 mmol/L — ABNORMAL HIGH (ref 3.5–5.1)
Sodium: 138 mmol/L (ref 135–145)
Total Bilirubin: 0.4 mg/dL (ref 0.3–1.2)
Total Protein: 5.2 g/dL — ABNORMAL LOW (ref 6.5–8.1)

## 2022-01-22 LAB — I-STAT CHEM 8, ED
BUN: 58 mg/dL — ABNORMAL HIGH (ref 8–23)
Calcium, Ion: 1.09 mmol/L — ABNORMAL LOW (ref 1.15–1.40)
Chloride: 105 mmol/L (ref 98–111)
Creatinine, Ser: 7 mg/dL — ABNORMAL HIGH (ref 0.44–1.00)
Glucose, Bld: 425 mg/dL — ABNORMAL HIGH (ref 70–99)
HCT: 28 % — ABNORMAL LOW (ref 36.0–46.0)
Hemoglobin: 9.5 g/dL — ABNORMAL LOW (ref 12.0–15.0)
Potassium: 4.3 mmol/L (ref 3.5–5.1)
Sodium: 136 mmol/L (ref 135–145)
TCO2: 18 mmol/L — ABNORMAL LOW (ref 22–32)

## 2022-01-22 LAB — PREPARE RBC (CROSSMATCH)

## 2022-01-22 LAB — HEMOGLOBIN AND HEMATOCRIT, BLOOD
HCT: 22.9 % — ABNORMAL LOW (ref 36.0–46.0)
HCT: 30.8 % — ABNORMAL LOW (ref 36.0–46.0)
Hemoglobin: 6.9 g/dL — CL (ref 12.0–15.0)
Hemoglobin: 10.1 g/dL — ABNORMAL LOW (ref 12.0–15.0)

## 2022-01-22 LAB — CBC
HCT: 25.5 % — ABNORMAL LOW (ref 36.0–46.0)
Hemoglobin: 7.5 g/dL — ABNORMAL LOW (ref 12.0–15.0)
MCH: 27.6 pg (ref 26.0–34.0)
MCHC: 29.4 g/dL — ABNORMAL LOW (ref 30.0–36.0)
MCV: 93.8 fL (ref 80.0–100.0)
Platelets: 294 10*3/uL (ref 150–400)
RBC: 2.72 MIL/uL — ABNORMAL LOW (ref 3.87–5.11)
RDW: 19 % — ABNORMAL HIGH (ref 11.5–15.5)
WBC: 20.1 10*3/uL — ABNORMAL HIGH (ref 4.0–10.5)
nRBC: 0 % (ref 0.0–0.2)

## 2022-01-22 LAB — CBG MONITORING, ED
Glucose-Capillary: 334 mg/dL — ABNORMAL HIGH (ref 70–99)
Glucose-Capillary: 256 mg/dL — ABNORMAL HIGH (ref 70–99)
Glucose-Capillary: 137 mg/dL — ABNORMAL HIGH (ref 70–99)
Glucose-Capillary: 75 mg/dL (ref 70–99)
Glucose-Capillary: 64 mg/dL — ABNORMAL LOW (ref 70–99)
Glucose-Capillary: 97 mg/dL (ref 70–99)

## 2022-01-22 LAB — RESP PANEL BY RT-PCR (FLU A&B, COVID) ARPGX2
Influenza A by PCR: NEGATIVE
Influenza B by PCR: NEGATIVE
SARS Coronavirus 2 by RT PCR: NEGATIVE

## 2022-01-22 LAB — TROPONIN I (HIGH SENSITIVITY)
Troponin I (High Sensitivity): 25 ng/L — ABNORMAL HIGH (ref <18)
Troponin I (High Sensitivity): 28 ng/L — ABNORMAL HIGH (ref <18)

## 2022-01-22 LAB — MAGNESIUM: Magnesium: 2.8 mg/dL — ABNORMAL HIGH (ref 1.7–2.4)

## 2022-01-22 LAB — PHOSPHORUS: Phosphorus: 6.8 mg/dL — ABNORMAL HIGH (ref 2.5–4.6)

## 2022-01-22 LAB — PROTIME-INR
INR: 1.4 — ABNORMAL HIGH (ref 0.8–1.2)
Prothrombin Time: 16.5 seconds — ABNORMAL HIGH (ref 11.4–15.2)

## 2022-01-22 LAB — HEPATITIS B SURFACE ANTIGEN: Hepatitis B Surface Ag: NONREACTIVE

## 2022-01-22 MED ORDER — HYDROMORPHONE HCL 1 MG/ML IJ SOLN
0.5000 mg | INTRAMUSCULAR | Status: DC | PRN
  Administered 2022-01-22: 0.5 mg via INTRAVENOUS
  Filled 2022-01-22: qty 1

## 2022-01-22 MED ORDER — LACTATED RINGERS IV SOLN: INTRAVENOUS | Status: DC

## 2022-01-22 MED ORDER — LACTATED RINGERS IV BOLUS: 1000.0000 mL | Freq: Once | INTRAVENOUS | Status: DC

## 2022-01-22 MED ORDER — SODIUM ZIRCONIUM CYCLOSILICATE 10 G PO PACK
10.0000 g | PACK | ORAL | Status: AC
  Administered 2022-01-22: 10 g via ORAL
  Filled 2022-01-22: qty 1

## 2022-01-22 MED ORDER — SODIUM CHLORIDE 0.9 % IV BOLUS
250.0000 mL | Freq: Once | INTRAVENOUS | Status: AC
  Administered 2022-01-22: 250 mL via INTRAVENOUS

## 2022-01-22 MED ORDER — POLYETHYLENE GLYCOL 3350 17 G PO PACK
17.0000 g | PACK | Freq: Every day | ORAL | Status: DC | PRN
  Administered 2022-01-23: 17 g via ORAL
  Filled 2022-01-22: qty 1

## 2022-01-22 MED ORDER — SODIUM CHLORIDE 0.9% IV SOLUTION: Freq: Once | INTRAVENOUS | Status: DC

## 2022-01-22 MED ORDER — METRONIDAZOLE 500 MG PO TABS
500.0000 mg | ORAL_TABLET | Freq: Two times a day (BID) | ORAL | Status: DC
  Administered 2022-01-22 – 2022-01-25 (×7): 500 mg via ORAL
  Filled 2022-01-22 (×7): qty 1

## 2022-01-22 MED ORDER — PROTHROMBIN COMPLEX CONC HUMAN 500 UNITS IV KIT
3152.0000 [IU] | PACK | Status: AC
  Administered 2022-01-22: 3152 [IU] via INTRAVENOUS
  Filled 2022-01-22: qty 3152

## 2022-01-22 MED ORDER — SEVELAMER CARBONATE 800 MG PO TABS
1600.0000 mg | ORAL_TABLET | Freq: Three times a day (TID) | ORAL | Status: DC
  Administered 2022-01-23 – 2022-01-31 (×18): 1600 mg via ORAL
  Filled 2022-01-22 (×23): qty 2

## 2022-01-22 MED ORDER — SODIUM CHLORIDE 0.9 % IV BOLUS
500.0000 mL | Freq: Once | INTRAVENOUS | Status: AC
  Administered 2022-01-22: 500 mL via INTRAVENOUS

## 2022-01-22 MED ORDER — ACETAMINOPHEN 325 MG PO TABS
650.0000 mg | ORAL_TABLET | Freq: Four times a day (QID) | ORAL | Status: DC | PRN
  Administered 2022-01-25 – 2022-01-30 (×5): 650 mg via ORAL
  Filled 2022-01-22 (×4): qty 2

## 2022-01-22 MED ORDER — SENNA 8.6 MG PO TABS
1.0000 | ORAL_TABLET | Freq: Every day | ORAL | Status: DC
  Administered 2022-01-22 – 2022-01-30 (×9): 8.6 mg via ORAL
  Filled 2022-01-22 (×9): qty 1

## 2022-01-22 MED ORDER — INSULIN ASPART 100 UNIT/ML IJ SOLN
0.0000 [IU] | INTRAMUSCULAR | Status: DC
  Administered 2022-01-22: 15 [IU] via SUBCUTANEOUS
  Administered 2022-01-22: 11 [IU] via SUBCUTANEOUS

## 2022-01-22 MED ORDER — SODIUM CHLORIDE 0.9 % IV SOLN
2.0000 g | Freq: Once | INTRAVENOUS | Status: AC
  Administered 2022-01-22: 2 g via INTRAVENOUS
  Filled 2022-01-22: qty 12.5

## 2022-01-22 MED ORDER — LACTATED RINGERS IV BOLUS: 500.0000 mL | Freq: Once | INTRAVENOUS | Status: DC

## 2022-01-22 MED ORDER — SODIUM CHLORIDE 0.9 % IV SOLN
1.0000 g | INTRAVENOUS | Status: DC
  Administered 2022-01-23 – 2022-01-24 (×3): 1 g via INTRAVENOUS
  Filled 2022-01-22 (×6): qty 10

## 2022-01-22 MED ORDER — OXYCODONE HCL 5 MG PO TABS
5.0000 mg | ORAL_TABLET | Freq: Four times a day (QID) | ORAL | Status: DC | PRN
  Administered 2022-01-24 – 2022-01-26 (×6): 5 mg via ORAL
  Filled 2022-01-22 (×6): qty 1

## 2022-01-22 MED ORDER — LIDOCAINE-PRILOCAINE 2.5-2.5 % EX CREA: 1.0000 | TOPICAL_CREAM | CUTANEOUS | Status: DC | PRN

## 2022-01-22 MED ORDER — LIDOCAINE HCL (PF) 1 % IJ SOLN: 5.0000 mL | INTRAMUSCULAR | Status: DC | PRN

## 2022-01-22 MED ORDER — INSULIN ASPART 100 UNIT/ML IJ SOLN
0.0000 [IU] | INTRAMUSCULAR | Status: DC
  Administered 2022-01-23: 1 [IU] via SUBCUTANEOUS
  Administered 2022-01-23 – 2022-01-25 (×4): 2 [IU] via SUBCUTANEOUS

## 2022-01-22 MED ORDER — PROCHLORPERAZINE EDISYLATE 10 MG/2ML IJ SOLN
5.0000 mg | Freq: Four times a day (QID) | INTRAMUSCULAR | Status: DC | PRN
  Administered 2022-01-25: 5 mg via INTRAVENOUS
  Filled 2022-01-22 (×2): qty 2

## 2022-01-22 MED ORDER — PENTAFLUOROPROP-TETRAFLUOROETH EX AERO: 1.0000 | INHALATION_SPRAY | CUTANEOUS | Status: DC | PRN

## 2022-01-22 MED ORDER — DEXTROSE 50 % IV SOLN: 1.0000 | INTRAVENOUS | Status: DC | PRN

## 2022-01-22 NOTE — ED Notes (Addendum)
Critical care physician at bedside. Blood transfusion delayed til pt to go to dialysis per critical care.

## 2022-01-22 NOTE — ED Notes (Addendum)
Called dialysis at this time. Report given. Pt placed on  portable monitoring on portable o2. . Pt transported to dialysis with belongings. Blood transfusion requested to be started in dialysis per RN.

## 2022-01-22 NOTE — H&P (Addendum)
History and Physical  Kim Burns CXK:[id number] DOB: 09-14-1935 DOA: 01/21/2022  Referring physician: Dr. Dina Rich, [place]  PCP: [name], MD  Outpatient Specialists: Urology Patient coming from: Home  Chief Complaint:  Worsening back pain  HPI: Kim Burns is a 86 y.o. female with medical history significant for ESRD on HD MWF, paroxysmal A-fib on Eliquis, diastolic CHF, nonobstructive coronary artery disease, prior CVA, left renal cell carcinoma, history of severe left hydronephrosis with obstruction of the proximal ureter, history of left perinephric abscess, status post left ureteral stent and drain placement, who presented to [facility] ED from home after returning from a trip from [place] due to severe diffuse back pain, abdominal pain and chest pains.  Associated with intermittent hematuria for the past 3 weeks.  Due to abnormal EKG cardiology was consulted.  He reviewed the EKG, shows some inferior ST changes.  To note, the patient had a heart cath on 7/23 which showed widely patent RCA and circumflex with 50% LAD lesion.  EDP discussed the case with cardiology who recommended medical management from a cardiac standpoint.  The patient was admitted by [facility], hospitalist service.  ED Course: Tmax 96.6.  BP 134/70, pulse 80, respiratory rate 36, O2 saturation 100% on 2 L [place].  Review of Systems: Review of systems as noted in the HPI. All other systems reviewed and are negative.   Past Medical History:  Diagnosis Date   Anemia of chronic disease    takes iron   Anxiety    Blood transfusion without reported diagnosis    Bradycardia    Diabetes mellitus without complication ([place])    became diabetic after Whipple procedure   Diarrhea    Dysrhythmia 04/16/2016   bradycardia due to medication    ESRD on hemodialysis [facility])    M-W-F   GERD (gastroesophageal reflux disease)    Gout    Headache    History of kidney stones 06/2013   Hyperparathyroidism ([place])    Hypertension     PONV (postoperative nausea and vomiting)    Sleep apnea    no cpap machine. could not tolerate   Stroke ([place]) 04/16/2016   TIA 1995   Vitamin D deficiency    Past Surgical History:  Procedure Laterality Date   A/V FISTULAGRAM Left 08/30/2021   Procedure: A/V Fistulagram;  Surgeon: Marty Heck, MD;  Location: [place] [facility];  Service: Cardiovascular;  Laterality: Left;   ABDOMINAL HYSTERECTOMY  1985   complete   BACK SURGERY  9702   lower   BASCILIC VEIN TRANSPOSITION Left 04/24/2017   Procedure: LEFT ARM FIRST STAGE BASILIC VEIN TRANSPOSITION;  Surgeon: Serafina Mitchell, MD;  Location: [facility] OR;  Service: Vascular;  Laterality: Left;   [place] Left 07/10/2017   Procedure: SECOND STAGE BASILIC VEIN TRANSPOSITION LEFT ARM;  Surgeon: Serafina Mitchell, MD;  Location: [facility];  Service: Vascular;  Laterality: Left;   BREAST LUMPECTOMY Left x 2   many years apart, benign   CHOLECYSTECTOMY  1985   CYSTOSCOPY W/ URETERAL STENT PLACEMENT Left 10/31/2021   Procedure: CYSTOSCOPY WITH RETROGRADE PYELOGRAM/URETERAL STENT PLACEMENT;  Surgeon: Ardis Hughs, MD;  Location: [place];  Service: Urology;  Laterality: Left;   CYSTOSCOPY WITH URETEROSCOPY AND STENT PLACEMENT Left 06/18/2013   Procedure: CYSTOSCOPY WITH Lef URETEROSCOPY AND Left STENT PLACEMENT;  Surgeon: Dutch Gray, MD;  Location: [facility];  Service: Urology;  Laterality: Left;   ESOPHAGOGASTRODUODENOSCOPY (EGD) WITH PROPOFOL N/A 11/13/2016   Procedure:  ESOPHAGOGASTRODUODENOSCOPY (EGD) WITH PROPOFOL;  Surgeon: Otis Brace, MD;  Location: [place];  Service: Gastroenterology;  Laterality: N/A;   EUS N/A 02/07/2016   Procedure: ESOPHAGEAL ENDOSCOPIC ULTRASOUND (EUS) RADIAL;  Surgeon: Arta Silence, MD;  Location: [facility];  Service: Endoscopy;  Laterality: N/A;   EYE SURGERY Bilateral 2014   ioc for catracts    FOOT SURGERY Left 1990   something with toes unsure what    HERNIA REPAIR  2018   IR  EMBO TUMOR ORGAN ISCHEMIA INFARCT INC GUIDE ROADMAPPING  08/22/2021   IR RADIOLOGIST EVAL & MGMT  05/18/2021   IR RADIOLOGIST EVAL & MGMT  10/01/2021   IR RADIOLOGIST EVAL & MGMT  11/12/2021   IR RADIOLOGIST EVAL & MGMT  11/22/2021   IR RADIOLOGIST EVAL & MGMT  11/27/2021   IR RADIOLOGIST EVAL & MGMT  12/20/2021   IR RENAL SUPRASEL UNI S&I MOD SED  08/22/2021   IR US GUIDE VASC ACCESS RIGHT  08/22/2021   LEFT HEART CATH AND CORONARY ANGIOGRAPHY N/A 08/27/2021   Procedure: LEFT HEART CATH AND CORONARY ANGIOGRAPHY;  Surgeon: Burnell Blanks, MD;  Location: [place] [facility];  Service: Cardiovascular;  Laterality: N/A;   PARATHYROID EXPLORATION     PERIPHERAL VASCULAR BALLOON ANGIOPLASTY Left 08/30/2021   Procedure: PERIPHERAL VASCULAR BALLOON ANGIOPLASTY;  Surgeon: Marty Heck, MD;  Location: [place] [facility];  Service: Cardiovascular;  Laterality: Left;  arm fistula   RADIOLOGY WITH ANESTHESIA Left 08/22/2021   Procedure: MICROWAVE ABLATION;  Surgeon: Sandi Mariscal, MD;  [facility]: [facility];  Service: Anesthesiology;  Laterality: Left;   WHIPPLE PROCEDURE N/A 04/23/2016   Procedure: WHIPPLE PROCEDURE;  Surgeon: Stark Klein, MD;  Location: [place];  Service: General;  Laterality: N/A;    Social History:  reports that she has never smoked. She has never used smokeless tobacco. She reports that she does not drink alcohol and does not use drugs.   Allergies  Allergen Reactions   Lopid [Gemfibrozil] Other (See Comments)    Myalgia    Lotemax [Loteprednol Etabonate] Rash    Skin burning    Statins Other (See Comments)    Muscle weakness   Norco [Hydrocodone-Acetaminophen] Other (See Comments)    Headaches  Tolerates acetaminophen    Other Diarrhea    Real Butter - diarrhea   Tomato Other (See Comments)    Belching    Vibra-Tab [Doxycycline] Other (See Comments)    Unknown reaction   Codeine Other (See Comments)    headache   Hydrocodone-Acetaminophen Other (See Comments)     Headache. Tolerates acetaminophen.    Family History  Problem Relation Age of Onset   Heart disease Mother    Stroke Mother    Cancer Father        colon   Alzheimer's disease Sister    Alzheimer's disease Sister    Cancer Brother        brain   Breast cancer Neg Hx       Prior to Admission medications   Medication Sig Start Date End Date Taking? Authorizing Provider  acetaminophen (TYLENOL 8 HOUR ARTHRITIS PAIN) 650 MG CR tablet Take 1,300 mg by mouth daily as needed for pain.    [provider]  amiodarone (PACERONE) 200 MG tablet Take 0.5 tablets (100 mg total) by mouth daily. Please keep appointment for further refills 11/23/21   Martinique, Peter M, MD  apixaban (ELIQUIS) 2.5 MG TABS tablet Take 1 tablet (2.5 mg total) by mouth 2 (  two) times daily. 09/25/21 03/24/22  Martinique, Peter M, MD  carvedilol (COREG) 3.125 MG tablet Take 1 tablet (3.125 mg total) by mouth 2 (two) times daily with a meal. Take on the days you do not have dialysis 11/04/21   Mercy Riding, MD  Cholecalciferol (VITAMIN D-3 PO) Take 1 capsule by mouth every Monday, Wednesday, and Friday with hemodialysis.    [provider]  colestipol (COLESTID) 1 g tablet Take 1 g by mouth See admin instructions. 1 tablet (1g) prior to dialysis M-W-F + 1 tablet daily PRN diarrhea    [provider]  diclofenac sodium (VOLTAREN) 1 % GEL Apply 2 g topically 4 (four) times daily as needed (for hand pain).  Patient not taking: Reported on 11/23/2021    [provider]  insulin aspart (NOVOLOG FLEXPEN) 100 UNIT/ML FlexPen Inject 10 Units into the skin 3 (three) times daily as needed (CBG > 200).    [provider]  lidocaine-prilocaine (EMLA) cream Apply 1 Application topically every Monday, Wednesday, and Friday with hemodialysis.    [provider]  midodrine (PROAMATINE) 10 MG tablet Take 10 mg by mouth as directed. 09/27/21   [provider]  polyethylene glycol powder (MIRALAX)  17 GM/SCOOP powder Take 17 g by mouth 2 (two) times daily as needed for moderate constipation. Patient not taking: Reported on 11/23/2021 11/04/21   Mercy Riding, MD  RESTASIS 0.05 % ophthalmic emulsion 1 drop 2 (two) times daily. 11/06/21   [provider]  senna-docusate (SENOKOT-S) 8.6-50 MG tablet Take 1 tablet by mouth 2 (two) times daily between meals as needed for mild constipation. Patient not taking: Reported on 11/23/2021 11/04/21   Mercy Riding, MD  sevelamer carbonate (RENVELA) 800 MG tablet Take 1,600 mg by mouth 3 (three) times daily with meals.    [provider]  sodium chloride flush (NS) 0.9 % SOLN 5 mLs by Intracatheter route every 8 (eight) hours. Patient not taking: Reported on 11/23/2021 11/04/21   Mercy Riding, MD  XIIDRA 5 % SOLN Place 1 drop into both eyes at bedtime. Patient not taking: Reported on 11/23/2021 09/24/21   [provider]    Physical Exam: BP (!) 147/75   Pulse 76   Temp (!) 96.6 F (35.9 C) (Rectal)   Resp 14   Ht '5\' 2"'$  (1.575 m)   Wt 58 kg   SpO2 99%   BMI 23.39 kg/m   General: 86 y.o. year-old female well developed well nourished in no acute distress.  Alert and somnolent but arousable to voices. Cardiovascular: Regular rate and rhythm with no rubs or gallops.  No thyromegaly or JVD noted.  No lower extremity edema. 2/4 pulses in all 4 extremities. Respiratory: Clear to auscultation with no wheezes or rales. Good inspiratory effort. Abdomen: Soft nontender nondistended with normal bowel sounds x4 quadrants. Muskuloskeletal: No cyanosis, clubbing or edema noted bilaterally Neuro: CN II-XII intact, strength, sensation, reflexes Skin: No ulcerative lesions noted or rashes Psychiatry: Unable to assess judgment and mood due to somnolence.         Labs on Admission:  Basic Metabolic Panel: Recent Labs  Lab 01/21/22 2305 01/22/22 0017  NA 136 136  K 4.3 4.3  CL 104 105  CO2 19*  --   GLUCOSE 378* 425*  BUN 59* 58*   CREATININE 6.46* 7.00*  CALCIUM 8.7*  --    Liver Function Tests: Recent Labs  Lab 01/21/22 2305  AST 37  ALT 52*  ALKPHOS 55  BILITOT 0.4  PROT 5.6*  ALBUMIN 2.4*   No results for input(s): "LIPASE", "AMYLASE" in the last 168 hours. No results for input(s): "AMMONIA" in the last 168 hours. CBC: Recent Labs  Lab 01/21/22 2305 01/22/22 0017  WBC 23.6*  --   NEUTROABS 19.8*  --   HGB 9.0* 9.5*  HCT 29.5* 28.0*  MCV 91.3  --   PLT 325  --    Cardiac Enzymes: No results for input(s): "CKTOTAL", "CKMB", "CKMBINDEX", "TROPONINI" in the last 168 hours.  BNP (last 3 results) Recent Labs    09/05/21 2118  BNP 2,213.9*    ProBNP (last 3 results) No results for input(s): "PROBNP" in the last 8760 hours.  CBG: No results for input(s): "GLUCAP" in the last 168 hours.  Radiological Exams on Admission: CT Renal Stone Study  Result Date: 01/22/2022 CLINICAL DATA:  Abdominal flank pain. EXAM: CT ABDOMEN AND PELVIS WITHOUT CONTRAST TECHNIQUE: Multidetector CT imaging of the abdomen and pelvis was performed following the standard protocol without IV contrast. RADIATION DOSE REDUCTION: This exam was performed according to the departmental dose-optimization program which includes automated exposure control, adjustment of the mA and/or kV according to patient size and/or use of iterative reconstruction technique. COMPARISON:  CT abdomen pelvis dated 11/12/2021. FINDINGS: Evaluation of this exam is limited in the absence of intravenous contrast. Lower chest: Partially visualized small left pleural effusion. There is coronary vascular calcification. No intra-abdominal free air or free fluid. Hepatobiliary: The liver is unremarkable. There is mild biliary dilatation, post cholecystectomy. Pancreas: The pancreas is atrophic and poorly visualized. Spleen: The spleen is unremarkable. Adrenals/Urinary Tract: The right adrenal glands unremarkable. The left adrenal gland is not visualized. There  has been interval development of a large left renal subcapsular hematoma extending into the perirenal space. The hematoma measures approximately 6.2 x 8.9 cm in greatest axial dimensions and 14 cm in craniocaudal length. There is mass effect and compression of the left renal parenchyma. Please note evaluation for possible active bleed is limited on this noncontrast CT. Similar positioning of the percutaneous drainage catheter and left pigtail ureteral stent. No hydronephrosis. Moderate right renal atrophy with several cysts and additional smaller hypodense lesions which are not characterized. There is no hydronephrosis on the right. The right ureter and urinary bladder appear unremarkable. Stomach/Bowel: Postsurgical changes of the distal stomach. The stomach is distended. High attenuating focus within the distal stomach likely ingested content. There is no bowel obstruction. The appendix is not visualized with certainty. No inflammatory changes identified in the right lower quadrant. Vascular/Lymphatic: Advanced aortoiliac atherosclerotic disease. The IVC is unremarkable. No portal venous gas. No obvious adenopathy. Reproductive: Hysterectomy. Other: None Musculoskeletal: Osteopenia with degenerative changes of the spine. No acute osseous pathology. IMPRESSION: 1. Large left renal subcapsular hematoma extending into the perirenal space. 2. Similar positioning of the percutaneous drainage catheter and left pigtail ureteral stent. No hydronephrosis. 3. No bowel obstruction. 4. Partially visualized small left pleural effusion. 5.  Aortic Atherosclerosis (ICD10-I70.0). These results were called by telephone at the time of interpretation on 01/22/2022 at 12:59 am to provider [facility] , who verbally acknowledged these results. Electronically Signed   By: Anner Crete M.D.   On: 01/22/2022 01:05   DG Chest Port 1 View  Result Date: 01/22/2022 CLINICAL DATA:  Possible sepsis. EXAM: PORTABLE CHEST 1 VIEW  COMPARISON:  10/29/2021. FINDINGS: The heart is enlarged and the mediastinal contour is within normal limits. Atherosclerotic calcification of the aorta is noted.  Lung volumes are low with atelectasis at the lung bases. No definite effusion or pneumothorax. Surgical clips are noted in the cervical soft tissues on the right. No acute osseous abnormality. IMPRESSION: 1. Low lung volumes with atelectasis at the lung bases. 2. Cardiomegaly. Electronically Signed   By: Brett Fairy M.D.   On: 01/22/2022 00:30    EKG: I independently viewed the EKG done and my findings are as followed: Sinus rhythm rate of 77.  Nonspecific ST-T changes.  QTc 487.  Assessment/Plan Present on Admission:  Hematoma of left kidney  Principal Problem:   Hematoma of left kidney  Hematoma of left kidney, POA On Eliquis prior to admission DOAC Eliquis versus with Kcentra. Repeat H&H every 6 hours Transfuse hemoglobin less than 8.0. Continue to closely monitor Pain control and bowel regimen. Consult urology in the morning  Sepsis secondary to possible intra-abdominal infection History of left perinephric abscess Code sepsis was called in the ED Continue cefepime started in the ED Monitor peripheral blood cultures Monitor fever curve and WBC.  ESRD on HD MWF Resume hemodialysis Volume status and electrolytes managed with hemodialysis.  Type 2 diabetes with hyperglycemia Presented with serum glucose greater than 400 Obtain Hemoglobin A1c Start insulin sliding scale every 4 hours while NPO N.p.o. until more alert.  Chronic diastolic CHF Last 2D echo done on 08/28/2018 showed LVEF 60 to 65% with grade 3 diastolic dysfunction. Start strict I's and O's and daily weight  Hypertension BP is currently at goal Continue current regimen Monitor vital signs.   DVT prophylaxis: SCDs.  Home Eliquis on hold.  Code Status: Full code  Family Communication: None at bedside  Disposition Plan: Admitted to progressive  care unit  Consults called: Urology, cardiology.  Admission status: Inpatient status.   Status is: Inpatient The patient requires at least 2 midnights for further evaluation and treatment plan condition   Kayleen Memos MD [facility] Pager [phone]  If 7PM-7AM, please contact night-coverage [email or web address] Password [email or web address]  01/22/2022, 2:31 AM

## 2022-01-22 NOTE — Procedures (Signed)
I was present at this dialysis session, have reviewed the session itself and made  appropriate changes  Pt initially hypotensive 70/50 - running 1st of 2 planned pRBC and BP trending up > 100 on last check.  She is arousable and oriented, not complaining of any new pain or dizziness.  PCCM and primary team alerted to VS at early dialysis.    Jannifer Hick MD [facility] pager [phone]   01/22/2022, 2:40 PM

## 2022-01-22 NOTE — Consult Note (Addendum)
NAME:  Kim Burns, MRN:  [id number], DOB:  Jan 31, 1936, LOS: 0 ADMISSION DATE:  01/21/2022, CONSULTATION DATE:  01/22/22 REFERRING MD:  Antonieta Pert MD, CHIEF COMPLAINT:  Sepsis and renal hematoma   History of Present Illness:  86 year old female with history of ESRD on HD MWF, A-fib on Eliquis, diastolic CHF, nonobstructive CAD, left renal cell carcinoma status post left nephrostomy tube, who presents with severe left flank pain that started after dinner night of 01/21/2022.  Pain is described as sharp and radiates to front of abdomen and under left breast.  It is worse with movement.  Patient also describes some bloody output from left nephrostomy tube that has been ongoing for couple of weeks.  Review of Systems  Constitutional:  Positive for chills and fever.  Respiratory:  Negative for cough and shortness of breath.   Cardiovascular:  Negative for chest pain.  Gastrointestinal:  Positive for abdominal pain, diarrhea (chronic) and nausea. Negative for blood in stool and vomiting.   Hospital course notable for lactate of approximately 6, hemoglobin of 7, and a CT scan showing a large left subcapsular renal hematoma extending into perirenal space.  Also with WBCs approximately 20 and hypothermia.  Cefepime and Flagyl started.  Pertinent  Medical History   Past Medical History:  Diagnosis Date   Anemia of chronic disease    takes iron   Anxiety    Blood transfusion without reported diagnosis    Bradycardia    Diabetes mellitus without complication ([place])    became diabetic after Whipple procedure   Diarrhea    Dysrhythmia 04/16/2016   bradycardia due to medication    ESRD on hemodialysis [facility])    M-W-F   GERD (gastroesophageal reflux disease)    Gout    Headache    History of kidney stones 06/2013   Hyperparathyroidism ([place])    Hypertension    PONV (postoperative nausea and vomiting)    Sleep apnea    no cpap machine. could not tolerate   Stroke ([place]) 04/16/2016   TIA 1995    Vitamin D deficiency      Significant Hospital Events: Including procedures, antibiotic start and stop dates in addition to other pertinent events   12/5>current flagyl 12/4>current cefepime  Objective   Blood pressure (!) 106/55, pulse 83, temperature 98.6 F (37 C), temperature source Axillary, resp. rate (!) 23, height _0  (1.575 m), weight 58 kg, SpO2 98 %.        Intake/Output Summary (Last 24 hours) at 01/22/2022 1119 Last data filed at 01/22/2022 0450 Gross per 24 hour  Intake 535.99 ml  Output --  Net 535.99 ml   Filed Weights   01/21/22 2306  Weight: 58 kg    Examination: Ill-appearing and pale. Mucous membranes are dry.  Conjunctiva are pale. Regular rate and rhythm.  Right radial pulse 2+.  No lower extremity edema. Respirations are regular and unlabored.  Lungs are clear to auscultation. Abdomen is tender to light palpation. Left nephrostomy tube draining dark-colored urine. Skin is warm and dry. Alert.  No gross focal deficits.  Labs: VBG 7.220/50.7/63/22  Sodium 138  Potassium 4.8 BUN/creatinine 58/7 Alk phos 55 Albumin 2.4 AST/ALT 37/52  Troponin 25, 28 Lactate 5.4, 5.7, 4.3, 5  WBC 20.1 Hemoglobin 6.9, decreasing  PT/INR 16.5/1.4  EKG: 01/22/2022 1:11 AM sinus rhythm, no evidence of ST elevation or depression  Imaging: CXR shows no focal consolidation or evidence of airspace disease.  Cardiomegaly evident. CT renal stone study shows  large left renal subcapsular hematoma extending into the perirenal space.  No evidence of bowel obstruction.  Appendix not well-visualized.  Resolved Hospital Problem list   Resolved Problems:   * No resolved hospital problems. *   Assessment & Plan:  Principal Problem:   Hematoma of left kidney  Lactic acidosis Demand cardiac ischemia Hemodynamically stable. Appears dry on exam. Elevated lactate. She's compensating at this time but I'm concerned her condition could progress to overt shock. In  setting of renal hematoma, could be hypovolemic due to hemorrhagic. Also could be septic with elevated WBC and hypothermia with L nephrostomy as potential nidus of infection. Hopeful that lactic acidosis will resolve with adequate resuscitation. Will follow closely. - LR 1 L bolus over 4 hours - Repeat lactate - Agree with antibiotics  L renal subcapsular hematoma extending into perirenal space Acute on chronic anemia Source of flank and abdominal pain. Concern for hemorrhagic shock and acute blood loss anemia. - Agree with transfusion  ESRD - Dialysis per nephrology  Type 2 diabetes complicated by hyperglycemia - Per primary  Past Medical History:  She,  has a past medical history of Anemia of chronic disease, Anxiety, Blood transfusion without reported diagnosis, Bradycardia, Diabetes mellitus without complication ([place]), Diarrhea, Dysrhythmia (04/16/2016), ESRD on hemodialysis ([place]), GERD (gastroesophageal reflux disease), Gout, Headache, History of kidney stones (06/2013), Hyperparathyroidism ([place]), Hypertension, PONV (postoperative nausea and vomiting), Sleep apnea, Stroke ([place]) (04/16/2016), and Vitamin D deficiency.   Surgical History:   Past Surgical History:  Procedure Laterality Date   A/V FISTULAGRAM Left 08/30/2021   Procedure: A/V Fistulagram;  Surgeon: Marty Heck, MD;  Location: [place] [facility];  Service: Cardiovascular;  Laterality: Left;   ABDOMINAL HYSTERECTOMY  1985   complete   BACK SURGERY  9833   lower   BASCILIC VEIN TRANSPOSITION Left 04/24/2017   Procedure: LEFT ARM FIRST STAGE BASILIC VEIN TRANSPOSITION;  Surgeon: Serafina Mitchell, MD;  Location: [facility];  Service: Vascular;  Laterality: Left;   [place] Left 07/10/2017   Procedure: SECOND STAGE BASILIC VEIN TRANSPOSITION LEFT ARM;  Surgeon: Serafina Mitchell, MD;  Location: [facility];  Service: Vascular;  Laterality: Left;   BREAST LUMPECTOMY Left x 2   many years apart, benign    CHOLECYSTECTOMY  1985   CYSTOSCOPY W/ URETERAL STENT PLACEMENT Left 10/31/2021   Procedure: CYSTOSCOPY WITH RETROGRADE PYELOGRAM/URETERAL STENT PLACEMENT;  Surgeon: Ardis Hughs, MD;  Location: [place];  Service: Urology;  Laterality: Left;   CYSTOSCOPY WITH URETEROSCOPY AND STENT PLACEMENT Left 06/18/2013   Procedure: CYSTOSCOPY WITH Lef URETEROSCOPY AND Left STENT PLACEMENT;  Surgeon: Dutch Gray, MD;  Location: [facility];  Service: Urology;  Laterality: Left;   ESOPHAGOGASTRODUODENOSCOPY (EGD) WITH PROPOFOL N/A 11/13/2016   Procedure: ESOPHAGOGASTRODUODENOSCOPY (EGD) WITH PROPOFOL;  Surgeon: Otis Brace, MD;  Location: [place];  Service: Gastroenterology;  Laterality: N/A;   EUS N/A 02/07/2016   Procedure: ESOPHAGEAL ENDOSCOPIC ULTRASOUND (EUS) RADIAL;  Surgeon: Arta Silence, MD;  Location[facility];  Service: Endoscopy;  Laterality: N/A;   EYE SURGERY Bilateral 2014   ioc for catracts    FOOT SURGERY Left 1990   something with toes unsure what    HERNIA REPAIR  2018   IR EMBO TUMOR ORGAN ISCHEMIA INFARCT INC GUIDE ROADMAPPING  08/22/2021   IR RADIOLOGIST EVAL & MGMT  05/18/2021   IR RADIOLOGIST EVAL & MGMT  10/01/2021   IR RADIOLOGIST EVAL & MGMT  11/12/2021   IR RADIOLOGIST EVAL & MGMT  11/22/2021   IR RADIOLOGIST EVAL & MGMT  11/27/2021   IR RADIOLOGIST EVAL & MGMT  12/20/2021   IR RENAL SUPRASEL UNI S&I MOD SED  08/22/2021   IR US GUIDE VASC ACCESS RIGHT  08/22/2021   LEFT HEART CATH AND CORONARY ANGIOGRAPHY N/A 08/27/2021   Procedure: LEFT HEART CATH AND CORONARY ANGIOGRAPHY;  Surgeon: Burnell Blanks, MD;  Location: [place] [facility];  Service: Cardiovascular;  Laterality: N/A;   PARATHYROID EXPLORATION     PERIPHERAL VASCULAR BALLOON ANGIOPLASTY Left 08/30/2021   Procedure: PERIPHERAL VASCULAR BALLOON ANGIOPLASTY;  Surgeon: Marty Heck, MD;  Location: [place] [facility];  Service: Cardiovascular;  Laterality: Left;  arm fistula   RADIOLOGY WITH ANESTHESIA  Left 08/22/2021   Procedure: MICROWAVE ABLATION;  Surgeon: Sandi Mariscal, MD;  Location: [facility];  Service: Anesthesiology;  Laterality: Left;   WHIPPLE PROCEDURE N/A 04/23/2016   Procedure: WHIPPLE PROCEDURE;  Surgeon: Stark Klein, MD;  Location: [place];  Service: General;  Laterality: N/A;     Social History:   reports that she has never smoked. She has never used smokeless tobacco. She reports that she does not drink alcohol and does not use drugs.   Family History:  Her family history includes Alzheimer's disease in her sister and sister; Cancer in her brother and father; Heart disease in her mother; Stroke in her mother. There is no history of Breast cancer.   Allergies Allergies  Allergen Reactions   Lopid [Gemfibrozil] Other (See Comments)    Myalgia    Lotemax [Loteprednol Etabonate] Rash    Skin burning    Statins Other (See Comments)    Muscle weakness   Norco [Hydrocodone-Acetaminophen] Other (See Comments)    Headaches  Tolerates acetaminophen    Other Diarrhea    Real Butter - diarrhea   Tomato Other (See Comments)    Belching    Vibra-Tab [Doxycycline] Other (See Comments)    Unknown reaction   Codeine Other (See Comments)    headache   Hydrocodone-Acetaminophen Other (See Comments)    Headache. Tolerates acetaminophen.     Home Medications  Prior to Admission medications   Medication Sig Start Date End Date Taking? Authorizing Provider  acetaminophen (TYLENOL 8 HOUR ARTHRITIS PAIN) 650 MG CR tablet Take 1,300 mg by mouth daily as needed for pain.    [provider]  amiodarone (PACERONE) 200 MG tablet Take 0.5 tablets (100 mg total) by mouth daily. Please keep appointment for further refills 11/23/21   Martinique, Peter M, MD  apixaban (ELIQUIS) 2.5 MG TABS tablet Take 1 tablet (2.5 mg total) by mouth 2 (two) times daily. 09/25/21 03/24/22  Martinique, Peter M, MD  carvedilol (COREG) 3.125 MG tablet Take 1 tablet (3.125 mg total) by mouth 2 (two) times daily with a  meal. Take on the days you do not have dialysis 11/04/21   Mercy Riding, MD  Cholecalciferol (VITAMIN D-3 PO) Take 1 capsule by mouth every Monday, Wednesday, and Friday with hemodialysis.    [provider]  colestipol (COLESTID) 1 g tablet Take 1 g by mouth See admin instructions. 1 tablet (1g) prior to dialysis M-W-F + 1 tablet daily PRN diarrhea    [provider]  diclofenac sodium (VOLTAREN) 1 % GEL Apply 2 g topically 4 (four) times daily as needed (for hand pain).  Patient not taking: Reported on 11/23/2021    [provider]  insulin aspart (NOVOLOG FLEXPEN) 100 UNIT/ML FlexPen Inject 10 Units into the  skin 3 (three) times daily as needed (CBG > 200).    [provider]  lidocaine-prilocaine (EMLA) cream Apply 1 Application topically every Monday, Wednesday, and Friday with hemodialysis.    [provider]  midodrine (PROAMATINE) 10 MG tablet Take 10 mg by mouth as directed. 09/27/21   [provider]  polyethylene glycol powder (MIRALAX) 17 GM/SCOOP powder Take 17 g by mouth 2 (two) times daily as needed for moderate constipation. Patient not taking: Reported on 11/23/2021 11/04/21   Mercy Riding, MD  RESTASIS 0.05 % ophthalmic emulsion 1 drop 2 (two) times daily. 11/06/21   [provider]  senna-docusate (SENOKOT-S) 8.6-50 MG tablet Take 1 tablet by mouth 2 (two) times daily between meals as needed for mild constipation. Patient not taking: Reported on 11/23/2021 11/04/21   Mercy Riding, MD  sevelamer carbonate (RENVELA) 800 MG tablet Take 1,600 mg by mouth 3 (three) times daily with meals.    [provider]  sodium chloride flush (NS) 0.9 % SOLN 5 mLs by Intracatheter route every 8 (eight) hours. Patient not taking: Reported on 11/23/2021 11/04/21   Mercy Riding, MD  XIIDRA 5 % SOLN Place 1 drop into both eyes at bedtime. Patient not taking: Reported on 11/23/2021 09/24/21   [provider]    Nani Gasser  MD 01/22/2022, 1:45 PM

## 2022-01-22 NOTE — Progress Notes (Addendum)
Patient seen and examined personally, I reviewed the chart, history and physical and admission note, done by admitting physician this morning and reviewed admission H&P,-refer to H&P and also refer plan of care as below   Briefly, 86 year old female history of renal cell mass status post nephrostomy tube, ureteral stent, ESRD on HD MWF PAF on Eliquis, chronic diastolic CHF nonobstructive CAD, previous CVA presents with severe back pain found to have subcapsular hematoma.  See was having intermittent hematuria x3 wk,returned from a trip from [place] 12/4 and started having severe diffuse back pain during night so came to the ED.  In the ED had abnormal EKG with subtle inferior ST elevation and ST depression in lateral lead EDP discussed with on-call cardiology Dr. Irish Lack patient generally clean cath in July 2023 with 20% stenosis of RCA-advised holding off on cath and continue to repeat EKG follow-up troponins> +/- heparin if significant bump in troponin.  Labs showed significant leukocytosis, elevated creatinine 6.4, troponin 25 > 28 hemoglobin 9.0> 9.5>7.,5 blood glucose 425, lactic acidosis 5.7> 5.4> 4.3.CT renal study showed large left renal subcapsular hematoma extending into the perirenal space left pigtail ureteral stent in place, no hydronephrosis, urology  Dr Cain Sieve was consulted> advised that she does  Kcentra was given to reverse Eliquis.  Patient was started on IV antibiotics blood cultures and also start gentle IV fluids and admitted   Seen this am Was sleeping soundly after morphine when I woke her up , reprots she feels some pain C/o left upper abd and left flank pain but when moves whole left chest and left abdomen hurts On RA, No fever chills shortness of breath  issues Large left renal subcapsular hematoma Intermittent hematuria x3 weeks S/p left tumor ablation c/b urine leak-refractory to management and with an indwelling stent and nephrostomy tube: Hemoglobin drifting down,  has been on movement and exam, CT show. Will do 2 units PRBC in HD Has persistent lactic acidosis.urology has been consulted, will also give a heads up to IR per urology recommendation keep NPO-ice chips for now.  I discussed with critical care,urology and nephrology team. I called her son [name] updated. I communicated with Dr. Kerney Elbe from urology he feels- bleeding from left kidney possibly previous tumor site or may be related to drain,when patient recovers she will need follow-up imaging   Acute blood loss anemia Anemia of chronic renal disease: Baseline hemoglobin variable from 7.8-9.0.  Hemoglobin trend as below dropping we will give 2 units PRBC and HD. Recent Labs  Lab 01/21/22 2305 01/22/22 0017 01/22/22 0506 01/22/22 1243 01/22/22 1257  HGB 9.0* 9.5* 7.5* 6.9* 7.5*  HCT 29.5* 28.0* 25.5* 22.9* 22.0*     Suspected Severe Sepsis POA-given patient's lactic acidosis/leukocytosis  possible intra-abdominal source History of left perinephric abscess: Keep on IV antibiotics broad-spectrum for now-added Flagyl, follow-up blood culture.Chest x-ray clear, and afebrile.  Check serial lactate.  Give (see IV NS bolus,consulted critical care-they have ordered additional 1 L Recent Labs  Lab 01/21/22 2305 01/22/22 0001 01/22/22 0038 01/22/22 0506 01/22/22 0545 01/22/22 0857 01/22/22 1243  WBC 23.6*  --   --  20.1*  --   --   --   LATICACIDVEN  --  5.4* 5.7*  --  4.3* 5.0* 3.5*    ESRD on HD MWF: Due for HD today, nephrology contacted urgently> for HD today  Hyperkalemia potassium of 5.4: s/p Lokelma Recent Labs  Lab 01/21/22 2305 01/22/22 0017 01/22/22 0506 01/22/22 1257  K 4.3 4.3 5.4* 4.8  Previous CVA PAF on Eliquis: Eliquis discontinued,but will need to d/w risks/benefits;given her stroke history she is high risk w/ CHA2DS2-VASc Score = 7   Type 2 diabetes mellitus with uncontrolled hyperglycemia, on long-term insulin: Continue SSI and monitor Recent Labs  Lab  01/22/22 0514 01/22/22 0834 01/22/22 1232  GLUCAP 334* 256* 137*    Chronic diastolic CHF: Appears dry.  Fluid adjustment per HD Hypertension -bp stable in ED. Soft in HD  Goals of care CODE STATUS full code, I have updated patient's son over the phone he is aware about seriousness of her current condition, multiple specialities are involved and being monitored very closely, currently in progressive beta status,  low threshold for ICU transfer.  Addendum :I came to see her again in HD since she had transient hypotension in HD given ivf. She says pain better, feet are warm, alert awake. Finished 1st unit prbc abt to start 2nd unit Spoke w/ HD RN. BP is in 130s IR evaluating as well for possible embolization> I communicated with urology again and  Critical care aware and low threshold for icu transfer.

## 2022-01-22 NOTE — Hospital Course (Addendum)
86 year old female history of renal cell mass status post nephrostomy tube, ureteral stent, ESRD on HD MWF PAF on Eliquis, chronic diastolic CHF nonobstructive CAD, previous CVA presents with severe back pain found to have subcapsular hematoma.  See was having intermittent hematuria x3 wk,returned from a trip from [place] 12/4 and started having severe diffuse back pain during night so came to the ED.  In the ED had abnormal EKG with subtle inferior ST elevation and ST depression in lateral lead EDP discussed with on-call cardiology Dr. Irish Lack patient generally clean cath in July 2023 with 20% stenosis of RCA-advised holding off on cath and continue to repeat EKG follow-up troponins> +/- heparin if significant bump in troponin.  Labs showed significant leukocytosis, elevated creatinine 6.4, troponin 25 > 28 hemoglobin 9.0> 9.5>7.,5 blood glucose 425, lactic acidosis 5.7> 5.4> 4.3.CT renal study showed large left renal subcapsular hematoma extending into the perirenal space left pigtail ureteral stent in place, no hydronephrosis, urology  Dr Cain Sieve was consulted> advised that she does  Kcentra was given to reverse Eliquis.  Patient was started on IV antibiotics blood cultures and also start gentle IV fluids and admitted

## 2022-01-22 NOTE — ED Provider Notes (Signed)
[facility] EMERGENCY DEPARTMENT Provider Note   CSN: [id number] Arrival date & time: 01/21/22  2253     History  Chief Complaint  Patient presents with   Back Pain    Kim Burns is a 86 y.o. female.  HPI     This is an 86 year old female with a history of end-stage renal disease on dialysis, nephrostomy tube, hypertension who presents with back pain.  Patient reports ongoing back pain that worsened tonight.  She reports it is mostly in the left.  This is where her left nephrostomy tube has been placed.  Son at the bedside indicates that she has had bloody urine output over the last week.  She is not had fevers.  She reports nausea and abdominal pain.  No change in bowel movements.  She last attended dialysis on Friday because she did not feel well enough today to go.  Patient provides limited history as she was given 8 mg of morphine by EMS and is fairly somnolent.  Home Medications Prior to Admission medications   Medication Sig Start Date End Date Taking? Authorizing Provider  acetaminophen (TYLENOL 8 HOUR ARTHRITIS PAIN) 650 MG CR tablet Take 1,300 mg by mouth daily as needed for pain.    [provider]  amiodarone (PACERONE) 200 MG tablet Take 0.5 tablets (100 mg total) by mouth daily. Please keep appointment for further refills 11/23/21   Martinique, Peter M, MD  apixaban (ELIQUIS) 2.5 MG TABS tablet Take 1 tablet (2.5 mg total) by mouth 2 (two) times daily. 09/25/21 03/24/22  Martinique, Peter M, MD  carvedilol (COREG) 3.125 MG tablet Take 1 tablet (3.125 mg total) by mouth 2 (two) times daily with a meal. Take on the days you do not have dialysis 11/04/21   Mercy Riding, MD  Cholecalciferol (VITAMIN D-3 PO) Take 1 capsule by mouth every Monday, Wednesday, and Friday with hemodialysis.    [provider]  colestipol (COLESTID) 1 g tablet Take 1 g by mouth See admin instructions. 1 tablet (1g) prior to dialysis M-W-F + 1 tablet daily PRN diarrhea     [provider]  diclofenac sodium (VOLTAREN) 1 % GEL Apply 2 g topically 4 (four) times daily as needed (for hand pain).  Patient not taking: Reported on 11/23/2021    [provider]  insulin aspart (NOVOLOG FLEXPEN) 100 UNIT/ML FlexPen Inject 10 Units into the skin 3 (three) times daily as needed (CBG > 200).    [provider]  lidocaine-prilocaine (EMLA) cream Apply 1 Application topically every Monday, Wednesday, and Friday with hemodialysis.    [provider]  midodrine (PROAMATINE) 10 MG tablet Take 10 mg by mouth as directed. 09/27/21   [provider]  polyethylene glycol powder (MIRALAX) 17 GM/SCOOP powder Take 17 g by mouth 2 (two) times daily as needed for moderate constipation. Patient not taking: Reported on 11/23/2021 11/04/21   Mercy Riding, MD  RESTASIS 0.05 % ophthalmic emulsion 1 drop 2 (two) times daily. 11/06/21   [provider]  senna-docusate (SENOKOT-S) 8.6-50 MG tablet Take 1 tablet by mouth 2 (two) times daily between meals as needed for mild constipation. Patient not taking: Reported on 11/23/2021 11/04/21   Mercy Riding, MD  sevelamer carbonate (RENVELA) 800 MG tablet Take 1,600 mg by mouth 3 (three) times daily with meals.    [provider]  sodium chloride flush (NS) 0.9 % SOLN 5 mLs by Intracatheter route every 8 (eight) hours. Patient  not taking: Reported on 11/23/2021 11/04/21   Mercy Riding, MD  XIIDRA 5 % SOLN Place 1 drop into both eyes at bedtime. Patient not taking: Reported on 11/23/2021 09/24/21   [provider]      Allergies    Lopid [gemfibrozil], Lotemax [loteprednol etabonate], Statins, Norco [hydrocodone-acetaminophen], Other, Tomato, Vibra-tab [doxycycline], Codeine, and Hydrocodone-acetaminophen    Review of Systems   Review of Systems  Constitutional:  Negative for fever.  Respiratory:  Negative for shortness of breath.   Cardiovascular:  Negative for chest pain.   Gastrointestinal:  Positive for abdominal pain and nausea.  Genitourinary:  Positive for hematuria.  Musculoskeletal:  Positive for back pain.  All other systems reviewed and are negative.   Physical Exam Updated Vital Signs BP (!) 146/74   Pulse 75   Resp (!) 25   Ht 1.575 m ('5\' 2"'$ )   Wt 58 kg   SpO2 99%   BMI 23.39 kg/m  Physical Exam Vitals and nursing note reviewed.  Constitutional:      Appearance: She is well-developed.     Comments: Early, chronically ill-appearing, no acute distress  HENT:     Head: Normocephalic and atraumatic.     Nose: Nose normal.     Mouth/Throat:     Mouth: Mucous membranes are dry.  Eyes:     Pupils: Pupils are equal, round, and reactive to light.  Cardiovascular:     Rate and Rhythm: Normal rate and regular rhythm.     Heart sounds: Normal heart sounds.  Pulmonary:     Effort: Pulmonary effort is normal. No respiratory distress.     Breath sounds: No wheezing.  Abdominal:     General: Bowel sounds are normal. There is no distension.     Palpations: Abdomen is soft.     Tenderness: There is abdominal tenderness. There is no guarding or rebound.  Genitourinary:    Comments: Left-sided nephrostomy tube in place, appears to be draining appropriately, no adjacent erythema at insertion site Musculoskeletal:     Cervical back: Neck supple.  Skin:    General: Skin is warm and dry.  Neurological:     Mental Status: She is alert and oriented to person, place, and time.     Comments: Somnolent and but arousable  Psychiatric:        Mood and Affect: Mood normal.     ED Results / Procedures / Treatments   Labs (all labs ordered are listed, but only abnormal results are displayed) Labs Reviewed  CBC WITH DIFFERENTIAL/PLATELET - Abnormal; Notable for the following components:      Result Value   WBC 23.6 (*)    RBC 3.23 (*)    Hemoglobin 9.0 (*)    HCT 29.5 (*)    RDW 18.8 (*)    Neutro Abs 19.8 (*)    Abs Immature Granulocytes 0.26  (*)    All other components within normal limits  COMPREHENSIVE METABOLIC PANEL - Abnormal; Notable for the following components:   CO2 19 (*)    Glucose, Bld 378 (*)    BUN 59 (*)    Creatinine, Ser 6.46 (*)    Calcium 8.7 (*)    Total Protein 5.6 (*)    Albumin 2.4 (*)    ALT 52 (*)    GFR, Estimated 6 (*)    All other components within normal limits  LACTIC ACID, PLASMA - Abnormal; Notable for the following components:   Lactic Acid, Venous 5.4 (*)  All other components within normal limits  PROTIME-INR - Abnormal; Notable for the following components:   Prothrombin Time 16.5 (*)    INR 1.4 (*)    All other components within normal limits  I-STAT CHEM 8, ED - Abnormal; Notable for the following components:   BUN 58 (*)    Creatinine, Ser 7.00 (*)    Glucose, Bld 425 (*)    Calcium, Ion 1.09 (*)    TCO2 18 (*)    Hemoglobin 9.5 (*)    HCT 28.0 (*)    All other components within normal limits  TROPONIN I (HIGH SENSITIVITY) - Abnormal; Notable for the following components:   Troponin I (High Sensitivity) 25 (*)    All other components within normal limits  CULTURE, BLOOD (ROUTINE X 2)  CULTURE, BLOOD (ROUTINE X 2)  RESP PANEL BY RT-PCR (FLU A&B, COVID) ARPGX2  URINE CULTURE  APTT  URINALYSIS, ROUTINE W REFLEX MICROSCOPIC  LACTIC ACID, PLASMA  TYPE AND SCREEN  TROPONIN I (HIGH SENSITIVITY)    EKG EKG Interpretation  Date/Time:  Monday January 21 2022 23:16:12 EST Ventricular Rate:  82 PR Interval:  154 QRS Duration: 78 QT Interval:  412 QTC Calculation: 481 R Axis:   -12 Text Interpretation: Normal sinus rhythm Minimal voltage criteria for LVH, may be normal variant ( R in aVL )  MInimal inferior ST changes - new from prior Abnormal ECG When compared with ECG of 29-Oct-2021 16:39, PREVIOUS ECG IS PRESENT Confirmed by Thayer Jew [phone]) on 01/22/2022 12:52:00 AM  Radiology CT Renal Stone Study  Result Date: 01/22/2022 CLINICAL DATA:  Abdominal flank  pain. EXAM: CT ABDOMEN AND PELVIS WITHOUT CONTRAST TECHNIQUE: Multidetector CT imaging of the abdomen and pelvis was performed following the standard protocol without IV contrast. RADIATION DOSE REDUCTION: This exam was performed according to the departmental dose-optimization program which includes automated exposure control, adjustment of the mA and/or kV according to patient size and/or use of iterative reconstruction technique. COMPARISON:  CT abdomen pelvis dated 11/12/2021. FINDINGS: Evaluation of this exam is limited in the absence of intravenous contrast. Lower chest: Partially visualized small left pleural effusion. There is coronary vascular calcification. No intra-abdominal free air or free fluid. Hepatobiliary: The liver is unremarkable. There is mild biliary dilatation, post cholecystectomy. Pancreas: The pancreas is atrophic and poorly visualized. Spleen: The spleen is unremarkable. Adrenals/Urinary Tract: The right adrenal glands unremarkable. The left adrenal gland is not visualized. There has been interval development of a large left renal subcapsular hematoma extending into the perirenal space. The hematoma measures approximately 6.2 x 8.9 cm in greatest axial dimensions and 14 cm in craniocaudal length. There is mass effect and compression of the left renal parenchyma. Please note evaluation for possible active bleed is limited on this noncontrast CT. Similar positioning of the percutaneous drainage catheter and left pigtail ureteral stent. No hydronephrosis. Moderate right renal atrophy with several cysts and additional smaller hypodense lesions which are not characterized. There is no hydronephrosis on the right. The right ureter and urinary bladder appear unremarkable. Stomach/Bowel: Postsurgical changes of the distal stomach. The stomach is distended. High attenuating focus within the distal stomach likely ingested content. There is no bowel obstruction. The appendix is not visualized with  certainty. No inflammatory changes identified in the right lower quadrant. Vascular/Lymphatic: Advanced aortoiliac atherosclerotic disease. The IVC is unremarkable. No portal venous gas. No obvious adenopathy. Reproductive: Hysterectomy. Other: None Musculoskeletal: Osteopenia with degenerative changes of the spine. No acute osseous pathology. IMPRESSION: 1. Large  left renal subcapsular hematoma extending into the perirenal space. 2. Similar positioning of the percutaneous drainage catheter and left pigtail ureteral stent. No hydronephrosis. 3. No bowel obstruction. 4. Partially visualized small left pleural effusion. 5.  Aortic Atherosclerosis (ICD10-I70.0). These results were called by telephone at the time of interpretation on 01/22/2022 at 12:59 am to provider [facility] , who verbally acknowledged these results. Electronically Signed   By: Anner Crete M.D.   On: 01/22/2022 01:05   DG Chest Port 1 View  Result Date: 01/22/2022 CLINICAL DATA:  Possible sepsis. EXAM: PORTABLE CHEST 1 VIEW COMPARISON:  10/29/2021. FINDINGS: The heart is enlarged and the mediastinal contour is within normal limits. Atherosclerotic calcification of the aorta is noted. Lung volumes are low with atelectasis at the lung bases. No definite effusion or pneumothorax. Surgical clips are noted in the cervical soft tissues on the right. No acute osseous abnormality. IMPRESSION: 1. Low lung volumes with atelectasis at the lung bases. 2. Cardiomegaly. Electronically Signed   By: Brett Fairy M.D.   On: 01/22/2022 00:30    Procedures .Critical Care  Performed by: Merryl Hacker, MD Authorized by: Merryl Hacker, MD   Critical care provider statement:    Critical care time (minutes):  35   Critical care was necessary to treat or prevent imminent or life-threatening deterioration of the following conditions:  Sepsis   Critical care was time spent personally by me on the following activities:  Development of treatment  plan with patient or surrogate, discussions with consultants, evaluation of patient's response to treatment, examination of patient, ordering and review of laboratory studies, ordering and review of radiographic studies, ordering and performing treatments and interventions, pulse oximetry, re-evaluation of patient's condition and review of old charts     Medications Ordered in ED Medications  lactated ringers infusion (has no administration in time range)  ceFEPIme (MAXIPIME) 2 g in sodium chloride 0.9 % 100 mL IVPB (2 g Intravenous New Bag/Given 01/22/22 0107)  ceFEPIme (MAXIPIME) 1 g in sodium chloride 0.9 % 100 mL IVPB (has no administration in time range)  prothrombin complex conc human (KCENTRA) IVPB 3,152 Units (has no administration in time range)  sodium chloride 0.9 % bolus 250 mL (250 mLs Intravenous New Bag/Given 01/22/22 0105)    ED Course/ Medical Decision Making/ A&P Clinical Course as of 01/22/22 0135  Mon Jan 21, 2022  2338 Spoke with on call STEMI doctor Dr Irish Lack regarding EKG with subtle inferior ST elevation inferior lead and ST depression lateral leads. Patient had generally clean cath in July 2023 and 20% stenosis of RCA. Her pain is left chest, left flank and abdomen in setting of blocked nephrostomy tube and c/f sepsis with WBC 23. Holding off calling cath lab and plan to continue repeat EKG and follow troponins +/- adding on heparin drip if significantly elevated from baseline.  [VB]  Tue Jan 22, 2022  0132 Spoke with Dr. Janeice Robinson, urology.  He reviewed CT images.  Agrees with Eliquis reversal.  Does not feel she needs acute intervention at this time.  Will be monitored closely.  Does not need to be NPO.  We will plan for admission to the hospitalist.  She will need dialysis later today as well. [CH]    Clinical Course User Index [CH] Lateefa Crosby, Barbette Hair, MD [VB] Elgie Congo, MD  Medical Decision Making Amount and/or Complexity of Data  Reviewed Labs: ordered. Radiology: ordered. ECG/medicine tests: ordered.  Risk Prescription drug management. Decision regarding hospitalization.   This patient presents to the ED for concern of back pain, this involves an extensive number of treatment options, and is a complaint that carries with it a high risk of complications and morbidity.  I considered the following differential and admission for this acute, potentially life threatening condition.  The differential diagnosis includes pyelonephritis, kidney stone, other complication related to recent surgeries and nephrostomy tube, musculoskeletal injury  MDM:    This is an 86 year old female who initially presented with back pain.  Fairly somnolent after receiving morphine by EMS.  She is nontoxic.  Temperature 96.6.  She is ill-appearing.  Son is now at the bedside and provides some of the history.  Initial work-up notable for white count of 23.6.  She denies any fever.  Sepsis work-up was initiated secondary to this.  Hemoglobin is 9.0.  Due to have dialysis today as she came home from a vacation on Monday.  Creatinine is 7.0.  Glucose is 425 without anion gap.  Of note, EKG showed some nonspecific minimal ST changes inferiorly.  Cardiology was paged by the provider in triage.  See note above.  Initial troponin 25.  She was covered with cefepime for possible urinary source.  There is no skin changes around the nephrostomy tube to indicate skin infection.  Chest x-ray without pneumothorax or pneumonia.  Lactate is 5.2.  She is full code at this time.  Given concern for volume overload in the setting of end-stage renal disease, will gently hydrate.  She was initially given 250 cc of fluid.  CT scan of the abdomen is concerning for a left subcapsular hematoma.  She is not hemodynamically unstable and her hemoglobin is 9.0.  We will type and screen.  She is on Eliquis.  This was reversed with Kcentra.  Spoke to urology.  They reviewed imaging.  No  surgical intervention at this time but she will need close monitoring.  She will also need dialysis later today.  Overall she could still be septic but I feel that her white count and lactic acidosis may be secondary to acute spontaneous subcapsular hematoma.  She was covered with cefepime.  We will hold further fluid resuscitation at this time as I doubt septic shock and her blood pressure is stable..  We will plan for admission to the hospitalist.    (Labs, imaging, consults)  Labs: I Ordered, and personally interpreted labs.  The pertinent results include: CBC, BMP, blood cultures, urinalysis  Imaging Studies ordered: I ordered imaging studies including x-ray, CT imaging independently reviewed by myself I independently visualized and interpreted imaging. I agree with the radiologist interpretation  Additional history obtained from the son at bedside.  External records from outside source obtained and reviewed including operative notes  Cardiac Monitoring: The patient was maintained on a cardiac monitor.  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 could not tolerate   Stroke ([place]) 04/16/2016   TIA 1995   Vitamin D deficiency      Medicines Meds ordered this encounter  Medications   lactated ringers infusion   ceFEPIme (MAXIPIME) 2 g in sodium chloride 0.9 % 100 mL IVPB    Order Specific Question:   Antibiotic Indication:    Answer:   UTI   ceFEPIme (MAXIPIME) 1 g in sodium chloride 0.9 % 100 mL IVPB    Order Specific Question:   Antibiotic Indication:    Answer:   UTI   sodium chloride 0.9 % bolus 250 mL   prothrombin complex conc human (KCENTRA) IVPB 3,152 Units    I have reviewed the patients home medicines and have made adjustments as needed  Problem List / ED Course: Problem List Items Addressed This Visit   None Visit Diagnoses     Hematoma of left kidney, initial encounter    -  Primary   Left-sided thoracic back pain, unspecified chronicity                       Final Clinical Impression(s) / ED Diagnoses Final diagnoses:  Hematoma of left kidney, initial encounter  Left-sided thoracic back pain, unspecified chronicity    Rx / DC Orders ED Discharge Orders     None         Merryl Hacker, MD 01/22/22 [phone]

## 2022-01-22 NOTE — ED Notes (Addendum)
ED TO INPATIENT HANDOFF REPORT  ED Nurse Name and Phone #: Stanton Kidney, RN  S Name/Age/Gender Kim Burns 86 y.o. female Room/Bed: 007C/007C  Code Status   Code Status: Full Code  Home/SNF/Other Home Patient oriented to: self, place, time, and situation Is this baseline? Yes   Triage Complete: Triage complete  Chief Complaint Hyperglycemia [R73.9] Hematoma of left kidney [S37.012A] Hematoma of left kidney, initial encounter [Q11.941D] Left-sided thoracic back pain, unspecified chronicity [M54.6]  Triage Note Pt arrived by EMS, complaining of back pain for past 2wks, that got worse about 1hr ago   No dialysis since Friday  Has nephrostomy drain, that has had red urine off and on for about 2 wks.   '8mg'$  morphine and 500cc NS bolus, 22 gauge right hand  Fistula in left arm   160/96 88 HR 99% RA  14 RR   Allergies Allergies  Allergen Reactions   Lopid [Gemfibrozil] Other (See Comments)    Myalgia    Lotemax [Loteprednol Etabonate] Rash    Skin burning    Statins Other (See Comments)    Muscle weakness   Norco [Hydrocodone-Acetaminophen] Other (See Comments)    Headaches  Tolerates acetaminophen    Other Diarrhea    Real Butter - diarrhea   Tomato Other (See Comments)    Belching    Vibra-Tab [Doxycycline] Other (See Comments)    Unknown reaction   Codeine Other (See Comments)    headache   Hydrocodone-Acetaminophen Other (See Comments)    Headache. Tolerates acetaminophen.    Level of Care/Admitting Diagnosis ED Disposition     ED Disposition  Admit   Condition  --   [place]: [place] [[id number]]  Level of Care: Progressive [102]  Admit to Progressive based on following criteria: COMPLICATED UROLOGY Patients requiring frequent assessments and interventions, such as continuous bladder irrigations, immediate post-op surgical procedures, i.e., bladder removal/ileal conduit.  Admit to Progressive based on following  criteria: NEPHROLOGY stable condition requiring close monitoring for AKI, requiring Hemodialysis or Peritoneal Dialysis either from expected electrolyte imbalance, acidosis, or fluid overload that can be managed by NIPPV or high flow oxygen.  May admit patient to [facility] or [facility] if equivalent level of care is available:: Yes  Covid Evaluation: Asymptomatic - no recent exposure (last 10 days) testing not required  Diagnosis: Hematoma of left kidney [408144]  Admitting Physician: Kayleen Memos [[id number]]  Attending Physician: Kayleen Memos [[id number]]  Certification:: I certify this patient will need inpatient services for at least 2 midnights  Estimated Length of Stay: 2          B Medical/Surgery History Past Medical History:  Diagnosis Date   Anemia of chronic disease    takes iron   Anxiety    Blood transfusion without reported diagnosis    Bradycardia    Diabetes mellitus without complication ([place])    became diabetic after Whipple procedure   Diarrhea    Dysrhythmia 04/16/2016   bradycardia due to medication    ESRD on hemodialysis [facility])    M-W-F   GERD (gastroesophageal reflux disease)    Gout    Headache    History of kidney stones 06/2013   Hyperparathyroidism ([place])    Hypertension    PONV (postoperative nausea and vomiting)    Sleep apnea    no cpap machine. could not tolerate   Stroke ([place]) 04/16/2016   TIA 1995   Vitamin D deficiency    Past Surgical  History:  Procedure Laterality Date   A/V FISTULAGRAM Left 08/30/2021   Procedure: A/V Fistulagram;  Surgeon: Marty Heck, MD;  Location: [place] [facility];  Service: Cardiovascular;  Laterality: Left;   ABDOMINAL HYSTERECTOMY  1985   complete   BACK SURGERY  1751   lower   BASCILIC VEIN TRANSPOSITION Left 04/24/2017   Procedure: LEFT ARM FIRST STAGE BASILIC VEIN TRANSPOSITION;  Surgeon: Serafina Mitchell, MD;  Location[facility];  Service: Vascular;  Laterality: Left;   [place] Left 07/10/2017   Procedure: SECOND STAGE BASILIC VEIN TRANSPOSITION LEFT ARM;  Surgeon: Serafina Mitchell, MD;  Location: [facility];  Service: Vascular;  Laterality: Left;   BREAST LUMPECTOMY Left x 2   many years apart, benign   CHOLECYSTECTOMY  1985   CYSTOSCOPY W/ URETERAL STENT PLACEMENT Left 10/31/2021   Procedure: CYSTOSCOPY WITH RETROGRADE PYELOGRAM/URETERAL STENT PLACEMENT;  Surgeon: Ardis Hughs, MD;  Location: [place];  Service: Urology;  Laterality: Left;   CYSTOSCOPY WITH URETEROSCOPY AND STENT PLACEMENT Left 06/18/2013   Procedure: CYSTOSCOPY WITH Lef URETEROSCOPY AND Left STENT PLACEMENT;  Surgeon: Dutch Gray, MD;  Location[facility];  Service: Urology;  Laterality: Left;   ESOPHAGOGASTRODUODENOSCOPY (EGD) WITH PROPOFOL N/A 11/13/2016   Procedure: ESOPHAGOGASTRODUODENOSCOPY (EGD) WITH PROPOFOL;  Surgeon: Otis Brace, MD;  Location: [place];  Service: Gastroenterology;  Laterality: N/A;   EUS N/A 02/07/2016   Procedure: ESOPHAGEAL ENDOSCOPIC ULTRASOUND (EUS) RADIAL;  Surgeon: Arta Silence, MD;  [facility];  Service: Endoscopy;  Laterality: N/A;   EYE SURGERY Bilateral 2014   ioc for catracts    FOOT SURGERY Left 1990   something with toes unsure what    HERNIA REPAIR  2018   IR EMBO TUMOR ORGAN ISCHEMIA INFARCT INC GUIDE ROADMAPPING  08/22/2021   IR RADIOLOGIST EVAL & MGMT  05/18/2021   IR RADIOLOGIST EVAL & MGMT  10/01/2021   IR RADIOLOGIST EVAL & MGMT  11/12/2021   IR RADIOLOGIST EVAL & MGMT  11/22/2021   IR RADIOLOGIST EVAL & MGMT  11/27/2021   IR RADIOLOGIST EVAL & MGMT  12/20/2021   IR RENAL SUPRASEL UNI S&I MOD SED  08/22/2021   IR US GUIDE VASC ACCESS RIGHT  08/22/2021   LEFT HEART CATH AND CORONARY ANGIOGRAPHY N/A 08/27/2021   Procedure: LEFT HEART CATH AND CORONARY ANGIOGRAPHY;  Surgeon: Burnell Blanks, MD;  Location: [place] [facility];  Service: Cardiovascular;  Laterality: N/A;   PARATHYROID EXPLORATION     PERIPHERAL VASCULAR  BALLOON ANGIOPLASTY Left 08/30/2021   Procedure: PERIPHERAL VASCULAR BALLOON ANGIOPLASTY;  Surgeon: Marty Heck, MD;  Location: [place] [facility];  Service: Cardiovascular;  Laterality: Left;  arm fistula   RADIOLOGY WITH ANESTHESIA Left 08/22/2021   Procedure: MICROWAVE ABLATION;  Surgeon: Sandi Mariscal, MD;  [facility];  Service: Anesthesiology;  Laterality: Left;   WHIPPLE PROCEDURE N/A 04/23/2016   Procedure: WHIPPLE PROCEDURE;  Surgeon: Stark Klein, MD;  Location: [place];  Service: General;  Laterality: N/A;     A IV Location/Drains/Wounds Patient Lines/Drains/Airways Status     Active Line/Drains/Airways     Name Placement date Placement time Site Days   Peripheral IV 01/22/22 20 G Right Antecubital 01/22/22  0055  Antecubital  less than 1   Fistula / Graft Left Upper arm Arteriovenous fistula 04/24/17  1031  Upper arm  1734   Closed System Drain Inferior;Lateral;Left Flank Bulb (JP) 10 Fr. 11/02/21  1001  Flank  81  Ureteral Drain/Stent Left ureter 6 Fr. 06/18/13  1738  Left ureter  3140   Ureteral Drain/Stent Left ureter 6 Fr. 10/31/21  1618  Left ureter  83   Wound / Incision (Open or Dehisced) 11/02/21 Puncture Flank Left Perinephric abscess drain placement 11/02/21  1009  Flank  81            Intake/Output Last 24 hours  Intake/Output Summary (Last 24 hours) at 01/22/2022 2208 Last data filed at 01/22/2022 1606 Gross per 24 hour  Intake 1765.99 ml  Output -0.01 ml  Net 1766 ml    Labs/Imaging Results for orders placed or performed during the hospital encounter of 01/21/22 (from the past 48 hour(s))  CBC with Differential     Status: Abnormal   Collection Time: 01/21/22 11:05 PM  Result Value Ref Range   WBC 23.6 (H) 4.0 - 10.5 K/uL   RBC 3.23 (L) 3.87 - 5.11 MIL/uL   Hemoglobin 9.0 (L) 12.0 - 15.0 g/dL   HCT 29.5 (L) 36.0 - 46.0 %   MCV 91.3 80.0 - 100.0 fL   MCH 27.9 26.0 - 34.0 pg   MCHC 30.5 30.0 - 36.0 g/dL   RDW 18.8 (H) 11.5 - 15.5 %    Platelets 325 150 - 400 K/uL   nRBC 0.0 0.0 - 0.2 %   Neutrophils Relative % 84 %   Neutro Abs 19.8 (H) 1.7 - 7.7 K/uL   Lymphocytes Relative 11 %   Lymphs Abs 2.7 0.7 - 4.0 K/uL   Monocytes Relative 3 %   Monocytes Absolute 0.6 0.1 - 1.0 K/uL   Eosinophils Relative 1 %   Eosinophils Absolute 0.1 0.0 - 0.5 K/uL   Basophils Relative 0 %   Basophils Absolute 0.1 0.0 - 0.1 K/uL   Immature Granulocytes 1 %   Abs Immature Granulocytes 0.26 (H) 0.00 - 0.07 K/uL    Comment: Performed at [facility], [address] [place], [place] [address]  Comprehensive metabolic panel     Status: Abnormal   Collection Time: 01/21/22 11:05 PM  Result Value Ref Range   Sodium 136 135 - 145 mmol/L   Potassium 4.3 3.5 - 5.1 mmol/L   Chloride 104 98 - 111 mmol/L   CO2 19 (L) 22 - 32 mmol/L   Glucose, Bld 378 (H) 70 - 99 mg/dL    Comment: Glucose reference range applies only to samples taken after fasting for at least 8 hours.   BUN 59 (H) 8 - 23 mg/dL   Creatinine, Ser 6.46 (H) 0.44 - 1.00 mg/dL   Calcium 8.7 (L) 8.9 - 10.3 mg/dL   Total Protein 5.6 (L) 6.5 - 8.1 g/dL   Albumin 2.4 (L) 3.5 - 5.0 g/dL   AST 37 15 - 41 U/L   ALT 52 (H) 0 - 44 U/L   Alkaline Phosphatase 55 38 - 126 U/L   Total Bilirubin 0.4 0.3 - 1.2 mg/dL   GFR, Estimated 6 (L) >60 mL/min    Comment: (NOTE) Calculated using the CKD-EPI Creatinine Equation (2021)    Anion gap 13 5 - 15    Comment: Performed at [facility], [place] [address], [place], [place] [address]  Troponin I (High Sensitivity)     Status: Abnormal   Collection Time: 01/21/22 11:05 PM  Result Value Ref Range   Troponin I (High Sensitivity) 25 (H) <18 ng/L    Comment: (NOTE) Elevated high sensitivity troponin I (hsTnI) values and significant  changes  across serial measurements may suggest ACS but many other  chronic and acute conditions are known to elevate hsTnI results.  Refer to the "Links" section for chest pain algorithms and additional   guidance. Performed at [facility], [place] [address], [place], [place] [address]   Lactic acid, plasma     Status: Abnormal   Collection Time: 01/22/22 12:01 AM  Result Value Ref Range   Lactic Acid, Venous 5.4 (HH) 0.5 - 1.9 mmol/L    Comment: CRITICAL RESULT CALLED TO, READ BACK BY AND VERIFIED WITH A.JAMES,RN. 0041 01/22/22. L.PAIT Performed at [facility], [place] [address], [place], [place] [address]   I-stat chem 8, ED (not at [facility] or [facility])     Status: Abnormal   Collection Time: 01/22/22 12:17 AM  Result Value Ref Range   Sodium 136 135 - 145 mmol/L   Potassium 4.3 3.5 - 5.1 mmol/L   Chloride 105 98 - 111 mmol/L   BUN 58 (H) 8 - 23 mg/dL   Creatinine, Ser 7.00 (H) 0.44 - 1.00 mg/dL   Glucose, Bld 425 (H) 70 - 99 mg/dL    Comment: Glucose reference range applies only to samples taken after fasting for at least 8 hours.   Calcium, Ion 1.09 (L) 1.15 - 1.40 mmol/L   TCO2 18 (L) 22 - 32 mmol/L   Hemoglobin 9.5 (L) 12.0 - 15.0 g/dL   HCT 28.0 (L) 36.0 - 46.0 %  Lactic acid, plasma     Status: Abnormal   Collection Time: 01/22/22 12:38 AM  Result Value Ref Range   Lactic Acid, Venous 5.7 (HH) 0.5 - 1.9 mmol/L    Comment: CRITICAL VALUE NOTED. VALUE IS CONSISTENT WITH PREVIOUSLY REPORTED/CALLED VALUE Performed at [facility], [place] [address], [place], [place] [address]   Protime-INR     Status: Abnormal   Collection Time: 01/22/22 12:38 AM  Result Value Ref Range   Prothrombin Time 16.5 (H) 11.4 - 15.2 seconds   INR 1.4 (H) 0.8 - 1.2    Comment: (NOTE) INR goal varies based on device and disease states. Performed at [facility], [place] [address] [place], [place] [address]   APTT     Status: None   Collection Time: 01/22/22 12:38 AM  Result Value Ref Range   aPTT 26 24 - 36 seconds    Comment: Performed at [facility] [address] [place],  [address]  Resp Panel by RT-PCR (Flu A&B, Covid) Anterior Nasal Swab     Status: None    Collection Time: 01/22/22  1:10 AM   Specimen: Anterior Nasal Swab  Result Value Ref Range   SARS Coronavirus 2 by RT PCR NEGATIVE NEGATIVE    Comment: (NOTE) SARS-CoV-2 target nucleic acids are NOT DETECTED.  The SARS-CoV-2 RNA is generally detectable in upper respiratory specimens during the acute phase of infection. The lowest concentration of SARS-CoV-2 viral copies this assay can detect is 138 copies/mL. A negative result does not preclude SARS-Cov-2 infection and should not be used as the sole basis for treatment or other patient management decisions. A negative result may occur with  improper specimen collection/handling, submission of specimen other than nasopharyngeal swab, presence of viral mutation(s) within the areas targeted by this assay, and inadequate number of viral copies(<138 copies/mL). A negative result must be combined with clinical observations, patient history, and epidemiological information. The expected result is Negative.  Fact Sheet for Patients:  EntrepreneurPulse.com.au  Fact  Sheet for Healthcare Providers:  IncredibleEmployment.be  This test is no t yet approved or cleared by the [place] FDA and  has been authorized for detection and/or diagnosis of SARS-CoV-2 by FDA under an Emergency Use Authorization (EUA). This EUA will remain  in effect (meaning this test can be used) for the duration of the COVID-19 declaration under Section 564(b)(1) of the Act, 21 U.S.C.section 360bbb-3(b)(1), unless the authorization is terminated  or revoked sooner.       Influenza A by PCR NEGATIVE NEGATIVE   Influenza B by PCR NEGATIVE NEGATIVE    Comment: (NOTE) The Xpert Xpress SARS-CoV-2/FLU/RSV plus assay is intended as an aid in the diagnosis of influenza from Nasopharyngeal swab specimens and should not be used as a sole basis for treatment. Nasal washings and aspirates are unacceptable for Xpert Xpress  SARS-CoV-2/FLU/RSV testing.  Fact Sheet for Patients: EntrepreneurPulse.com.au  Fact Sheet for Healthcare Providers: IncredibleEmployment.be  This test is not yet approved or cleared by the [place] FDA and has been authorized for detection and/or diagnosis of SARS-CoV-2 by FDA under an Emergency Use Authorization (EUA). This EUA will remain in effect (meaning this test can be used) for the duration of the COVID-19 declaration under Section 564(b)(1) of the Act, 21 U.S.C. section 360bbb-3(b)(1), unless the authorization is terminated or revoked.  Performed at [facility], [place] [address] [place], [place] [address]   Troponin I (High Sensitivity)     Status: Abnormal   Collection Time: 01/22/22  1:10 AM  Result Value Ref Range   Troponin I (High Sensitivity) 28 (H) <18 ng/L    Comment: (NOTE) Elevated high sensitivity troponin I (hsTnI) values and significant  changes across serial measurements may suggest ACS but many other  chronic and acute conditions are known to elevate hsTnI results.  Refer to the "Links" section for chest pain algorithms and additional  guidance. Performed at [facility], [place] [address] [place], [place] [address]   Type and screen [place]     Status: None (Preliminary result)   Collection Time: 01/22/22  1:41 AM  Result Value Ref Range   ABO/RH(D) A POS    Antibody Screen NEG    Sample Expiration 01/25/2022,2359    Unit Number [id number]    Blood Component Type RED CELLS,LR    Unit division 00    Status of Unit ISSUED    Transfusion Status OK TO TRANSFUSE    Crossmatch Result Compatible    Unit Number [id number]    Blood Component Type RED CELLS,LR    Unit division 00    Status of Unit ISSUED    Transfusion Status OK TO TRANSFUSE    Crossmatch Result      Compatible Performed at [facility], [place] [address] [place], [place] [address]   CBC     Status:  Abnormal   Collection Time: 01/22/22  5:06 AM  Result Value Ref Range   WBC 20.1 (H) 4.0 - 10.5 K/uL   RBC 2.72 (L) 3.87 - 5.11 MIL/uL   Hemoglobin 7.5 (L) 12.0 - 15.0 g/dL   HCT 25.5 (L) 36.0 - 46.0 %   MCV 93.8 80.0 - 100.0 fL   MCH 27.6 26.0 - 34.0 pg   MCHC 29.4 (L) 30.0 - 36.0 g/dL   RDW 19.0 (H) 11.5 - 15.5 %   Platelets 294 150 - 400 K/uL   nRBC 0.0 0.0 - 0.2 %    Comment: Performed  at [facility], [place] [address], [place], [place] [address]  Comprehensive metabolic panel     Status: Abnormal   Collection Time: 01/22/22  5:06 AM  Result Value Ref Range   Sodium 138 135 - 145 mmol/L   Potassium 5.4 (H) 3.5 - 5.1 mmol/L   Chloride 105 98 - 111 mmol/L   CO2 20 (L) 22 - 32 mmol/L   Glucose, Bld 379 (H) 70 - 99 mg/dL    Comment: Glucose reference range applies only to samples taken after fasting for at least 8 hours.   BUN 62 (H) 8 - 23 mg/dL   Creatinine, Ser 6.70 (H) 0.44 - 1.00 mg/dL   Calcium 9.0 8.9 - 10.3 mg/dL   Total Protein 5.2 (L) 6.5 - 8.1 g/dL   Albumin 2.3 (L) 3.5 - 5.0 g/dL   AST 28 15 - 41 U/L   ALT 47 (H) 0 - 44 U/L   Alkaline Phosphatase 45 38 - 126 U/L   Total Bilirubin 0.4 0.3 - 1.2 mg/dL   GFR, Estimated 6 (L) >60 mL/min    Comment: (NOTE) Calculated using the CKD-EPI Creatinine Equation (2021)    Anion gap 13 5 - 15    Comment: Performed at [facility], [place] [address], [place], [place] [address]  Magnesium     Status: Abnormal   Collection Time: 01/22/22  5:06 AM  Result Value Ref Range   Magnesium 2.8 (H) 1.7 - 2.4 mg/dL    Comment: Performed at [facility] [address] [place], [place] [address]  Phosphorus     Status: Abnormal   Collection Time: 01/22/22  5:06 AM  Result Value Ref Range   Phosphorus 6.8 (H) 2.5 - 4.6 mg/dL    Comment: Performed at [facility] [address] [place], [place] [address]  CBG monitoring, ED     Status: Abnormal   Collection Time: 01/22/22  5:14 AM  Result Value Ref Range    Glucose-Capillary 334 (H) 70 - 99 mg/dL    Comment: Glucose reference range applies only to samples taken after fasting for at least 8 hours.  Hepatitis B surface antigen     Status: None   Collection Time: 01/22/22  5:44 AM  Result Value Ref Range   Hepatitis B Surface Ag NON REACTIVE NON REACTIVE    Comment: Performed at [facility] [address] [place], [place] [address]  Lactic acid, plasma     Status: Abnormal   Collection Time: 01/22/22  5:45 AM  Result Value Ref Range   Lactic Acid, Venous 4.3 (HH) 0.5 - 1.9 mmol/L    Comment: CRITICAL VALUE NOTED. VALUE IS CONSISTENT WITH PREVIOUSLY REPORTED/CALLED VALUE Performed at [facility], [place] [address] [place], [place] [address]   CBG monitoring, ED     Status: Abnormal   Collection Time: 01/22/22  8:34 AM  Result Value Ref Range   Glucose-Capillary 256 (H) 70 - 99 mg/dL    Comment: Glucose reference range applies only to samples taken after fasting for at least 8 hours.  Lactic acid, plasma     Status: Abnormal   Collection Time: 01/22/22  8:57 AM  Result Value Ref Range   Lactic Acid, Venous 5.0 (HH) 0.5 - 1.9 mmol/L    Comment: CRITICAL VALUE NOTED. VALUE IS CONSISTENT WITH PREVIOUSLY REPORTED/CALLED VALUE Performed at [facility], [place] [address] [place], [place] [address]   Prepare RBC (  crossmatch)     Status: None   Collection Time: 01/22/22 10:37 AM  Result Value Ref Range   Order Confirmation      ORDER PROCESSED BY BLOOD BANK Performed at [facility], [place] [address], [place], [place] [address]   CBG monitoring, ED     Status: Abnormal   Collection Time: 01/22/22 12:32 PM  Result Value Ref Range   Glucose-Capillary 137 (H) 70 - 99 mg/dL    Comment: Glucose reference range applies only to samples taken after fasting for at least 8 hours.  Lactic acid, plasma     Status: Abnormal   Collection Time: 01/22/22 12:43 PM  Result Value Ref Range   Lactic Acid, Venous 3.5 (HH) 0.5 - 1.9 mmol/L     Comment: CRITICAL VALUE NOTED. VALUE IS CONSISTENT WITH PREVIOUSLY REPORTED/CALLED VALUE Performed at [facility], [place] [address], [place], [place] [address]   Hemoglobin and hematocrit, blood     Status: Abnormal   Collection Time: 01/22/22 12:43 PM  Result Value Ref Range   Hemoglobin 6.9 (LL) 12.0 - 15.0 g/dL    Comment: REPEATED TO VERIFY THIS CRITICAL RESULT HAS VERIFIED AND BEEN CALLED TO P. FURROW, RN BY LEAH KLAR ON 12 05 2023 AT 1258, AND HAS BEEN READ BACK.     HCT 22.9 (L) 36.0 - 46.0 %    Comment: Performed at [facility], [place] [address] [place], [place] [address]  I-Stat venous blood gas, ED     Status: Abnormal   Collection Time: 01/22/22 12:57 PM  Result Value Ref Range   pH, Ven 7.220 (L) 7.25 - 7.43   pCO2, Ven 50.7 44 - 60 mmHg   pO2, Ven 63 (H) 32 - 45 mmHg   Bicarbonate 20.8 20.0 - 28.0 mmol/L   TCO2 22 22 - 32 mmol/L   O2 Saturation 87 %   Acid-base deficit 7.0 (H) 0.0 - 2.0 mmol/L   Sodium 138 135 - 145 mmol/L   Potassium 4.8 3.5 - 5.1 mmol/L   Calcium, Ion 1.23 1.15 - 1.40 mmol/L   HCT 22.0 (L) 36.0 - 46.0 %   Hemoglobin 7.5 (L) 12.0 - 15.0 g/dL   Sample type VENOUS   Prepare RBC (crossmatch)     Status: None   Collection Time: 01/22/22  3:00 PM  Result Value Ref Range   Order Confirmation      ORDER PROCESSED BY BLOOD BANK Performed at [facility], [address] [place], [place] [address]   Lactic acid, plasma     Status: Abnormal   Collection Time: 01/22/22  5:45 PM  Result Value Ref Range   Lactic Acid, Venous 3.0 (HH) 0.5 - 1.9 mmol/L    Comment: CRITICAL VALUE NOTED. VALUE IS CONSISTENT WITH PREVIOUSLY REPORTED/CALLED VALUE Performed at [facility], [place] [address], [place], [place] [address]   CBG monitoring, ED     Status: None   Collection Time: 01/22/22  5:45 PM  Result Value Ref Range   Glucose-Capillary 75 70 - 99 mg/dL    Comment: Glucose reference range applies only to samples taken after fasting for  at least 8 hours.  Hemoglobin and hematocrit, blood     Status: Abnormal   Collection Time: 01/22/22  5:46 PM  Result Value Ref Range   Hemoglobin 10.1 (L) 12.0 - 15.0 g/dL    Comment: REPEATED TO VERIFY POST TRANSFUSION SPECIMEN  2 UNITS CONFIRMED BY BB  HCT 30.8 (L) 36.0 - 46.0 %    Comment: Performed at [facility], [place] [address], [place], [place] [address]  CBG monitoring, ED     Status: Abnormal   Collection Time: 01/22/22  7:37 PM  Result Value Ref Range   Glucose-Capillary 64 (L) 70 - 99 mg/dL    Comment: Glucose reference range applies only to samples taken after fasting for at least 8 hours.  CBG monitoring, ED     Status: None   Collection Time: 01/22/22  8:18 PM  Result Value Ref Range   Glucose-Capillary 97 70 - 99 mg/dL    Comment: Glucose reference range applies only to samples taken after fasting for at least 8 hours.   CT Renal Stone Study  Result Date: 01/22/2022 CLINICAL DATA:  Abdominal flank pain. EXAM: CT ABDOMEN AND PELVIS WITHOUT CONTRAST TECHNIQUE: Multidetector CT imaging of the abdomen and pelvis was performed following the standard protocol without IV contrast. RADIATION DOSE REDUCTION: This exam was performed according to the departmental dose-optimization program which includes automated exposure control, adjustment of the mA and/or kV according to patient size and/or use of iterative reconstruction technique. COMPARISON:  CT abdomen pelvis dated 11/12/2021. FINDINGS: Evaluation of this exam is limited in the absence of intravenous contrast. Lower chest: Partially visualized small left pleural effusion. There is coronary vascular calcification. No intra-abdominal free air or free fluid. Hepatobiliary: The liver is unremarkable. There is mild biliary dilatation, post cholecystectomy. Pancreas: The pancreas is atrophic and poorly visualized. Spleen: The spleen is unremarkable. Adrenals/Urinary Tract: The right adrenal glands unremarkable. The left adrenal  gland is not visualized. There has been interval development of a large left renal subcapsular hematoma extending into the perirenal space. The hematoma measures approximately 6.2 x 8.9 cm in greatest axial dimensions and 14 cm in craniocaudal length. There is mass effect and compression of the left renal parenchyma. Please note evaluation for possible active bleed is limited on this noncontrast CT. Similar positioning of the percutaneous drainage catheter and left pigtail ureteral stent. No hydronephrosis. Moderate right renal atrophy with several cysts and additional smaller hypodense lesions which are not characterized. There is no hydronephrosis on the right. The right ureter and urinary bladder appear unremarkable. Stomach/Bowel: Postsurgical changes of the distal stomach. The stomach is distended. High attenuating focus within the distal stomach likely ingested content. There is no bowel obstruction. The appendix is not visualized with certainty. No inflammatory changes identified in the right lower quadrant. Vascular/Lymphatic: Advanced aortoiliac atherosclerotic disease. The IVC is unremarkable. No portal venous gas. No obvious adenopathy. Reproductive: Hysterectomy. Other: None Musculoskeletal: Osteopenia with degenerative changes of the spine. No acute osseous pathology. IMPRESSION: 1. Large left renal subcapsular hematoma extending into the perirenal space. 2. Similar positioning of the percutaneous drainage catheter and left pigtail ureteral stent. No hydronephrosis. 3. No bowel obstruction. 4. Partially visualized small left pleural effusion. 5.  Aortic Atherosclerosis (ICD10-I70.0). These results were called by telephone at the time of interpretation on 01/22/2022 at 12:59 am to provider [facility] , who verbally acknowledged these results. Electronically Signed   By: Anner Crete M.D.   On: 01/22/2022 01:05   DG Chest Port 1 View  Result Date: 01/22/2022 CLINICAL DATA:  Possible sepsis. EXAM:  PORTABLE CHEST 1 VIEW COMPARISON:  10/29/2021. FINDINGS: The heart is enlarged and the mediastinal contour is within normal limits. Atherosclerotic calcification of the aorta is noted. Lung volumes are low with atelectasis at the lung bases. No definite effusion or  pneumothorax. Surgical clips are noted in the cervical soft tissues on the right. No acute osseous abnormality. IMPRESSION: 1. Low lung volumes with atelectasis at the lung bases. 2. Cardiomegaly. Electronically Signed   By: Brett Fairy M.D.   On: 01/22/2022 00:30    Pending Labs Unresulted Labs (From admission, onward)     Start     Ordered   01/23/22 5284  Basic metabolic panel  Tomorrow morning,   R        01/22/22 0740   01/23/22 0500  CBC  Tomorrow morning,   R        01/22/22 0740   01/22/22 1253  Hepatitis B surface antibody,quantitative  Once,   R        01/22/22 1253   01/22/22 0500  Hemoglobin A1c  Tomorrow morning,   R        01/22/22 0446   01/22/22 0013  Urine Culture  (Septic presentation on arrival (screening labs, nursing and treatment orders for obvious sepsis))  ONCE - URGENT,   URGENT       Question:  Indication  Answer:  Sepsis   01/22/22 0013   01/21/22 2325  Blood culture (routine x 2)  BLOOD CULTURE X 2,   R (with STAT occurrences)      01/21/22 2324   01/21/22 2257  Urinalysis, Routine w reflex microscopic  Once,   URGENT        01/21/22 2257   Pending  Hepatitis B surface antigen  ONCE - URGENT,   R        Pending            Vitals/Pain Today's Vitals   01/22/22 1830 01/22/22 1946 01/22/22 2015 01/22/22 2100  BP: 116/65  (!) 145/68 (!) 159/77  Pulse: 97  (!) 106 (!) 105  Resp: 16  20 (!) 23  Temp:  97.8 F (36.6 C)    TempSrc:  Oral    SpO2: 98%  99% 100%  Weight:      Height:      PainSc:        Isolation Precautions No active isolations  Medications Medications  ceFEPIme (MAXIPIME) 1 g in sodium chloride 0.9 % 100 mL IVPB (has no administration in time range)  acetaminophen  (TYLENOL) tablet 650 mg (has no administration in time range)  oxyCODONE (Oxy IR/ROXICODONE) immediate release tablet 5 mg (has no administration in time range)  HYDROmorphone (DILAUDID) injection 0.5 mg (0.5 mg Intravenous Given 01/22/22 0902)  senna (SENOKOT) tablet 8.6 mg (8.6 mg Oral Given 01/22/22 2101)  polyethylene glycol (MIRALAX / GLYCOLAX) packet 17 g (has no administration in time range)  prochlorperazine (COMPAZINE) injection 5 mg (has no administration in time range)  lactated ringers infusion ( Intravenous New Bag/Given 01/22/22 1835)  metroNIDAZOLE (FLAGYL) tablet 500 mg (500 mg Oral Given 01/22/22 2101)  0.9 %  sodium chloride infusion (Manually program via Guardrails IV Fluids) ( Intravenous Not Given 01/22/22 1728)  sevelamer carbonate (RENVELA) tablet 1,600 mg (has no administration in time range)  0.9 %  sodium chloride infusion (Manually program via Guardrails IV Fluids) ( Intravenous Not Given 01/22/22 1729)  dextrose 50 % solution 50 mL (has no administration in time range)  insulin aspart (novoLOG) injection 0-9 Units (has no administration in time range)  ceFEPIme (MAXIPIME) 2 g in sodium chloride 0.9 % 100 mL IVPB (0 g Intravenous Stopped 01/22/22 0149)  sodium chloride 0.9 % bolus 250 mL (0 mLs Intravenous Stopped 01/22/22 0315)  prothrombin complex conc human (KCENTRA) IVPB 3,152 Units (0 Units Intravenous Stopped 01/22/22 0315)  sodium zirconium cyclosilicate (LOKELMA) packet 10 g (10 g Oral Given 01/22/22 0830)  sodium chloride 0.9 % bolus 500 mL (0 mLs Intravenous Stopped 01/22/22 1200)    Mobility walks with person assist Moderate fall risk   Focused Assessments Neuro Assessment Handoff:  Swallow screen pass? Yes  Cardiac Rhythm: Sinus tachycardia (HR 101)       Neuro Assessment: Within Defined Limits Neuro Checks:      Last Documented NIHSS Modified Score:   Has TPA been given? No If patient is a Neuro Trauma and patient is going to OR before floor call  report to [facility] nurse: [phone] or [phone]   R Recommendations: See Admitting Provider Note  Report given to:   Additional Notes: pt is AAOx4. Pt is on 2L [place]. Pt is ambulatory to [facility] with assistance.

## 2022-01-22 NOTE — Sepsis Progress Note (Signed)
Following for sepsis monitoring ?

## 2022-01-22 NOTE — Progress Notes (Signed)
Patient ID: Kim Burns, female   DOB: 1935/09/26, 86 y.o.   MRN: [id number] Patient is known to IR service from previous renal tumor treatment and management of perinephric drain.  Patient presented with left renal hemorrhage and requiring blood products.  Briefly saw patient while she was in dialysis and discussed with Antonieta Pert.  Difficult to know if patient has ongoing bleeding based on her blood pressures during dialysis.  History of A.fib and Eliquis has been stopped.  If patient continues to have signs for bleeding, then recommend CTA of the abdomen and pelvis (with and without contrast) to look for a bleeding source prior to catheter directed embolization.  IR will follow .

## 2022-01-22 NOTE — ED Notes (Signed)
Nephrology provider at bedside. Pt to be given blood regardless of dialysis timing. Consent obtained. Pt blood tubing prepared and vitals obtained.

## 2022-01-22 NOTE — ED Notes (Signed)
Pt given juice and snacks d/t low blood sugar. MD notified.

## 2022-01-22 NOTE — Consult Note (Signed)
Urology Consult   Reason for consult: perinephric hematoma  History of Present Illness: Kim Burns is a 86 y.o. with a complicated GU history. She developed a urine leak after a left tumor ablation that has been refractory to management with an indwelling stent and a neph tube. She presented to the ED overnight c/o worsening  back, abdominal, and chest pain. Imaging was performed which I reviewed and shows a sizable perinephric hematoma as well as the left ureteral stent and nephrostomy tube  Of note cardiology was consulted in the ED for EKG changes; they were felt to be from demand ischemia.   Overnight HGB trended from 9.5 to 7.5.  Kim Burns endorses abdominal/flank pain at this time.    Past Medical History:  Diagnosis Date   Anemia of chronic disease    takes iron   Anxiety    Blood transfusion without reported diagnosis    Bradycardia    Diabetes mellitus without complication ([place])    became diabetic after Whipple procedure   Diarrhea    Dysrhythmia 04/16/2016   bradycardia due to medication    ESRD on hemodialysis [facility])    M-W-F   GERD (gastroesophageal reflux disease)    Gout    Headache    History of kidney stones 06/2013   Hyperparathyroidism ([place])    Hypertension    PONV (postoperative nausea and vomiting)    Sleep apnea    no cpap machine. could not tolerate   Stroke ([place]) 04/16/2016   TIA 1995   Vitamin D deficiency     Past Surgical History:  Procedure Laterality Date   A/V FISTULAGRAM Left 08/30/2021   Procedure: A/V Fistulagram;  Surgeon: Marty Heck, MD;  Location: [place] [facility];  Service: Cardiovascular;  Laterality: Left;   ABDOMINAL HYSTERECTOMY  1985   complete   BACK SURGERY  2355   lower   BASCILIC VEIN TRANSPOSITION Left 04/24/2017   Procedure: LEFT ARM FIRST STAGE BASILIC VEIN TRANSPOSITION;  Surgeon: Serafina [facility], MD;  Location: [facility];  Service: Vascular;  Laterality: Left;   [place] Left  07/10/2017   Procedure: SECOND STAGE BASILIC VEIN TRANSPOSITION LEFT ARM;  Surgeon: Serafina [facility], MD;  Location: [facility];  Service: Vascular;  Laterality: Left;   BREAST LUMPECTOMY Left x 2   many years apart, benign   CHOLECYSTECTOMY  1985   CYSTOSCOPY W/ URETERAL STENT PLACEMENT Left 10/31/2021   Procedure: CYSTOSCOPY WITH RETROGRADE PYELOGRAM/URETERAL STENT PLACEMENT;  Surgeon: Ardis Hughs, MD;  Location: [place];  Service: Urology;  Laterality: Left;   CYSTOSCOPY WITH URETEROSCOPY AND STENT PLACEMENT Left 06/18/2013   Procedure: CYSTOSCOPY WITH Lef URETEROSCOPY AND Left STENT PLACEMENT;  Surgeon: Dutch Gray, MD;  Location: [facility];  Service: Urology;  Laterality: Left;   ESOPHAGOGASTRODUODENOSCOPY (EGD) WITH PROPOFOL N/A 11/13/2016   Procedure: ESOPHAGOGASTRODUODENOSCOPY (EGD) WITH PROPOFOL;  Surgeon: Otis Brace, MD;  Location: [facility];  Service: Gastroenterology;  Laterality: N/A;   EUS N/A 02/07/2016   Procedure: ESOPHAGEAL ENDOSCOPIC ULTRASOUND (EUS) RADIAL;  Surgeon: Arta Silence, MD;  [facility];  Service: Endoscopy;  Laterality: N/A;   EYE SURGERY Bilateral 2014   ioc for catracts    FOOT SURGERY Left 1990   something with toes unsure what    HERNIA REPAIR  2018   IR EMBO TUMOR ORGAN ISCHEMIA INFARCT INC GUIDE ROADMAPPING  08/22/2021   IR RADIOLOGIST EVAL & MGMT  05/18/2021   IR RADIOLOGIST EVAL & MGMT  10/01/2021  IR RADIOLOGIST EVAL & MGMT  11/12/2021   IR RADIOLOGIST EVAL & MGMT  11/22/2021   IR RADIOLOGIST EVAL & MGMT  11/27/2021   IR RADIOLOGIST EVAL & MGMT  12/20/2021   IR RENAL SUPRASEL UNI S&I MOD SED  08/22/2021   IR US GUIDE VASC ACCESS RIGHT  08/22/2021   LEFT HEART CATH AND CORONARY ANGIOGRAPHY N/A 08/27/2021   Procedure: LEFT HEART CATH AND CORONARY ANGIOGRAPHY;  Surgeon: Burnell Blanks, MD;  Location: [place] [facility];  Service: Cardiovascular;  Laterality: N/A;   PARATHYROID EXPLORATION     PERIPHERAL VASCULAR BALLOON ANGIOPLASTY  Left 08/30/2021   Procedure: PERIPHERAL VASCULAR BALLOON ANGIOPLASTY;  Surgeon: Marty Heck, MD;  Location: [place] [facility];  Service: Cardiovascular;  Laterality: Left;  arm fistula   RADIOLOGY WITH ANESTHESIA Left 08/22/2021   Procedure: MICROWAVE ABLATION;  Surgeon: Sandi Mariscal, MD;  [facility];  Service: Anesthesiology;  Laterality: Left;   WHIPPLE PROCEDURE N/A 04/23/2016   Procedure: WHIPPLE PROCEDURE;  Surgeon: Stark Klein, MD;  Location: [place];  Service: General;  Laterality: N/A;     Current Hospital Medications:  Home meds:  No current facility-administered medications on file prior to encounter.   Current Outpatient Medications on File Prior to Encounter  Medication Sig Dispense Refill   acetaminophen (TYLENOL 8 HOUR ARTHRITIS PAIN) 650 MG CR tablet Take 1,300 mg by mouth daily as needed for pain.     amiodarone (PACERONE) 200 MG tablet Take 0.5 tablets (100 mg total) by mouth daily. Please keep appointment for further refills 90 tablet 3   apixaban (ELIQUIS) 2.5 MG TABS tablet Take 1 tablet (2.5 mg total) by mouth 2 (two) times daily. 60 tablet 5   carvedilol (COREG) 3.125 MG tablet Take 1 tablet (3.125 mg total) by mouth 2 (two) times daily with a meal. Take on the days you do not have dialysis 60 tablet 1   Cholecalciferol (VITAMIN D-3 PO) Take 1 capsule by mouth every Monday, Wednesday, and Friday with hemodialysis.     colestipol (COLESTID) 1 g tablet Take 1 g by mouth See admin instructions. 1 tablet (1g) prior to dialysis M-W-F + 1 tablet daily PRN diarrhea     diclofenac sodium (VOLTAREN) 1 % GEL Apply 2 g topically 4 (four) times daily as needed (for hand pain).  (Patient not taking: Reported on 11/23/2021)     insulin aspart (NOVOLOG FLEXPEN) 100 UNIT/ML FlexPen Inject 10 Units into the skin 3 (three) times daily as needed (CBG > 200).     lidocaine-prilocaine (EMLA) cream Apply 1 Application topically every Monday, Wednesday, and Friday with hemodialysis.      midodrine (PROAMATINE) 10 MG tablet Take 10 mg by mouth as directed.     polyethylene glycol powder (MIRALAX) 17 GM/SCOOP powder Take 17 g by mouth 2 (two) times daily as needed for moderate constipation. (Patient not taking: Reported on 11/23/2021) 255 g 0   RESTASIS 0.05 % ophthalmic emulsion 1 drop 2 (two) times daily.     senna-docusate (SENOKOT-S) 8.6-50 MG tablet Take 1 tablet by mouth 2 (two) times daily between meals as needed for mild constipation. (Patient not taking: Reported on 11/23/2021) 180 tablet 0   sevelamer carbonate (RENVELA) 800 MG tablet Take 1,600 mg by mouth 3 (three) times daily with meals.     sodium chloride flush (NS) 0.9 % SOLN 5 mLs by Intracatheter route every 8 (eight) hours. (Patient not taking: Reported on 11/23/2021)     XIIDRA 5 %  SOLN Place 1 drop into both eyes at bedtime. (Patient not taking: Reported on 11/23/2021)       Scheduled Meds:  sodium chloride   Intravenous Once   insulin aspart  0-20 Units Subcutaneous Q4H   metroNIDAZOLE  500 mg Oral Q12H   senna  1 tablet Oral QHS   Continuous Infusions:  [START ON 01/23/2022] ceFEPime (MAXIPIME) IV     lactated ringers     lactated ringers 50 mL/hr at 01/22/22 0451   PRN Meds:.acetaminophen, HYDROmorphone (DILAUDID) injection, oxyCODONE, polyethylene glycol, prochlorperazine  Allergies:  Allergies  Allergen Reactions   Lopid [Gemfibrozil] Other (See Comments)    Myalgia    Lotemax [Loteprednol Etabonate] Rash    Skin burning    Statins Other (See Comments)    Muscle weakness   Norco [Hydrocodone-Acetaminophen] Other (See Comments)    Headaches  Tolerates acetaminophen    Other Diarrhea    Real Butter - diarrhea   Tomato Other (See Comments)    Belching    Vibra-Tab [Doxycycline] Other (See Comments)    Unknown reaction   Codeine Other (See Comments)    headache   Hydrocodone-Acetaminophen Other (See Comments)    Headache. Tolerates acetaminophen.    Family History  Problem Relation  Age of Onset   Heart disease Mother    Stroke Mother    Cancer Father        colon   Alzheimer's disease Sister    Alzheimer's disease Sister    Cancer Brother        brain   Breast cancer Neg Hx     Social History:  reports that she has never smoked. She has never used smokeless tobacco. She reports that she does not drink alcohol and does not use drugs.  ROS: A complete review of systems was performed.  All systems are negative except for pertinent findings as noted.  Physical Exam:  Vital signs in last 24 hours: Temp:  [96.6 F (35.9 C)-98.6 F (37 C)] 98.6 F (37 C) (12/05 0945) Pulse Rate:  [75-83] 83 (12/05 0945) Resp:  [13-36] 23 (12/05 0945) BP: (101-147)/(54-77) 106/55 (12/05 0945) SpO2:  [84 %-100 %] 98 % (12/05 0945) Weight:  [58 kg] 58 kg (12/04 2306) Constitutional:  Alert and oriented, No acute distress Cardiovascular: Regular rate and rhythm Respiratory: Normal respiratory effort, Lungs clear bilaterally GI: Abdomen is soft, nontender, nondistended, no abdominal masses Neurologic: Grossly intact, no focal deficits Psychiatric: Normal mood and affect  Laboratory Data:  Recent Labs    01/21/22 2305 01/22/22 0017 01/22/22 0506  WBC 23.6*  --  20.1*  HGB 9.0* 9.5* 7.5*  HCT 29.5* 28.0* 25.5*  PLT 325  --  294    Recent Labs    01/21/22 2305 01/22/22 0017 01/22/22 0506  NA 136 136 138  K 4.3 4.3 5.4*  CL 104 105 105  GLUCOSE 378* 425* 379*  BUN 59* 58* 62*  CALCIUM 8.7*  --  9.0  CREATININE 6.46* 7.00* 6.70*     Results for orders placed or performed during the hospital encounter of 01/21/22 (from the past 24 hour(s))  CBC with Differential     Status: Abnormal   Collection Time: 01/21/22 11:05 PM  Result Value Ref Range   WBC 23.6 (H) 4.0 - 10.5 K/uL   RBC 3.23 (L) 3.87 - 5.11 MIL/uL   Hemoglobin 9.0 (L) 12.0 - 15.0 g/dL   HCT 29.5 (L) 36.0 - 46.0 %   MCV 91.3 80.0 - 100.0  fL   MCH 27.9 26.0 - 34.0 pg   MCHC 30.5 30.0 - 36.0 g/dL    RDW 18.8 (H) 11.5 - 15.5 %   Platelets 325 150 - 400 K/uL   nRBC 0.0 0.0 - 0.2 %   Neutrophils Relative % 84 %   Neutro Abs 19.8 (H) 1.7 - 7.7 K/uL   Lymphocytes Relative 11 %   Lymphs Abs 2.7 0.7 - 4.0 K/uL   Monocytes Relative 3 %   Monocytes Absolute 0.6 0.1 - 1.0 K/uL   Eosinophils Relative 1 %   Eosinophils Absolute 0.1 0.0 - 0.5 K/uL   Basophils Relative 0 %   Basophils Absolute 0.1 0.0 - 0.1 K/uL   Immature Granulocytes 1 %   Abs Immature Granulocytes 0.26 (H) 0.00 - 0.07 K/uL  Comprehensive metabolic panel     Status: Abnormal   Collection Time: 01/21/22 11:05 PM  Result Value Ref Range   Sodium 136 135 - 145 mmol/L   Potassium 4.3 3.5 - 5.1 mmol/L   Chloride 104 98 - 111 mmol/L   CO2 19 (L) 22 - 32 mmol/L   Glucose, Bld 378 (H) 70 - 99 mg/dL   BUN 59 (H) 8 - 23 mg/dL   Creatinine, Ser 6.46 (H) 0.44 - 1.00 mg/dL   Calcium 8.7 (L) 8.9 - 10.3 mg/dL   Total Protein 5.6 (L) 6.5 - 8.1 g/dL   Albumin 2.4 (L) 3.5 - 5.0 g/dL   AST 37 15 - 41 U/L   ALT 52 (H) 0 - 44 U/L   Alkaline Phosphatase 55 38 - 126 U/L   Total Bilirubin 0.4 0.3 - 1.2 mg/dL   GFR, Estimated 6 (L) >60 mL/min   Anion gap 13 5 - 15  Troponin I (High Sensitivity)     Status: Abnormal   Collection Time: 01/21/22 11:05 PM  Result Value Ref Range   Troponin I (High Sensitivity) 25 (H) <18 ng/L  Lactic acid, plasma     Status: Abnormal   Collection Time: 01/22/22 12:01 AM  Result Value Ref Range   Lactic Acid, Venous 5.4 (HH) 0.5 - 1.9 mmol/L  I-stat chem 8, ED (not at [facility] or [facility])     Status: Abnormal   Collection Time: 01/22/22 12:17 AM  Result Value Ref Range   Sodium 136 135 - 145 mmol/L   Potassium 4.3 3.5 - 5.1 mmol/L   Chloride 105 98 - 111 mmol/L   BUN 58 (H) 8 - 23 mg/dL   Creatinine, Ser 7.00 (H) 0.44 - 1.00 mg/dL   Glucose, Bld 425 (H) 70 - 99 mg/dL   Calcium, Ion 1.09 (L) 1.15 - 1.40 mmol/L   TCO2 18 (L) 22 - 32 mmol/L   Hemoglobin 9.5 (L) 12.0 - 15.0 g/dL   HCT 28.0 (L) 36.0 - 46.0 %   Lactic acid, plasma     Status: Abnormal   Collection Time: 01/22/22 12:38 AM  Result Value Ref Range   Lactic Acid, Venous 5.7 (HH) 0.5 - 1.9 mmol/L  Protime-INR     Status: Abnormal   Collection Time: 01/22/22 12:38 AM  Result Value Ref Range   Prothrombin Time 16.5 (H) 11.4 - 15.2 seconds   INR 1.4 (H) 0.8 - 1.2  APTT     Status: None   Collection Time: 01/22/22 12:38 AM  Result Value Ref Range   aPTT 26 24 - 36 seconds  Resp Panel by RT-PCR (Flu A&B, Covid) Anterior Nasal Swab     Status:  None   Collection Time: 01/22/22  1:10 AM   Specimen: Anterior Nasal Swab  Result Value Ref Range   SARS Coronavirus 2 by RT PCR NEGATIVE NEGATIVE   Influenza A by PCR NEGATIVE NEGATIVE   Influenza B by PCR NEGATIVE NEGATIVE  Troponin I (High Sensitivity)     Status: Abnormal   Collection Time: 01/22/22  1:10 AM  Result Value Ref Range   Troponin I (High Sensitivity) 28 (H) <18 ng/L  Type and screen [facility] [place]     Status: None (Preliminary result)   Collection Time: 01/22/22  1:41 AM  Result Value Ref Range   ABO/RH(D) A POS    Antibody Screen NEG    Sample Expiration      01/25/2022,2359 Performed at [facility], [place] [address] [place], [place] [address]    Unit Number [id number]    Blood Component Type RED CELLS,LR    Unit division 00    Status of Unit ALLOCATED    Transfusion Status OK TO TRANSFUSE    Crossmatch Result Compatible   CBC     Status: Abnormal   Collection Time: 01/22/22  5:06 AM  Result Value Ref Range   WBC 20.1 (H) 4.0 - 10.5 K/uL   RBC 2.72 (L) 3.87 - 5.11 MIL/uL   Hemoglobin 7.5 (L) 12.0 - 15.0 g/dL   HCT 25.5 (L) 36.0 - 46.0 %   MCV 93.8 80.0 - 100.0 fL   MCH 27.6 26.0 - 34.0 pg   MCHC 29.4 (L) 30.0 - 36.0 g/dL   RDW 19.0 (H) 11.5 - 15.5 %   Platelets 294 150 - 400 K/uL   nRBC 0.0 0.0 - 0.2 %  Comprehensive metabolic panel     Status: Abnormal   Collection Time: 01/22/22  5:06 AM  Result Value Ref Range   Sodium 138 135 -  145 mmol/L   Potassium 5.4 (H) 3.5 - 5.1 mmol/L   Chloride 105 98 - 111 mmol/L   CO2 20 (L) 22 - 32 mmol/L   Glucose, Bld 379 (H) 70 - 99 mg/dL   BUN 62 (H) 8 - 23 mg/dL   Creatinine, Ser 6.70 (H) 0.44 - 1.00 mg/dL   Calcium 9.0 8.9 - 10.3 mg/dL   Total Protein 5.2 (L) 6.5 - 8.1 g/dL   Albumin 2.3 (L) 3.5 - 5.0 g/dL   AST 28 15 - 41 U/L   ALT 47 (H) 0 - 44 U/L   Alkaline Phosphatase 45 38 - 126 U/L   Total Bilirubin 0.4 0.3 - 1.2 mg/dL   GFR, Estimated 6 (L) >60 mL/min   Anion gap 13 5 - 15  Magnesium     Status: Abnormal   Collection Time: 01/22/22  5:06 AM  Result Value Ref Range   Magnesium 2.8 (H) 1.7 - 2.4 mg/dL  Phosphorus     Status: Abnormal   Collection Time: 01/22/22  5:06 AM  Result Value Ref Range   Phosphorus 6.8 (H) 2.5 - 4.6 mg/dL  CBG monitoring, ED     Status: Abnormal   Collection Time: 01/22/22  5:14 AM  Result Value Ref Range   Glucose-Capillary 334 (H) 70 - 99 mg/dL  Lactic acid, plasma     Status: Abnormal   Collection Time: 01/22/22  5:45 AM  Result Value Ref Range   Lactic Acid, Venous 4.3 (HH) 0.5 - 1.9 mmol/L  CBG monitoring, ED     Status: Abnormal   Collection Time: 01/22/22  8:34 AM  Result Value Ref Range   Glucose-Capillary 256 (H) 70 - 99 mg/dL  Lactic acid, plasma     Status: Abnormal   Collection Time: 01/22/22  8:57 AM  Result Value Ref Range   Lactic Acid, Venous 5.0 (HH) 0.5 - 1.9 mmol/L  Prepare RBC (crossmatch)     Status: None   Collection Time: 01/22/22 10:37 AM  Result Value Ref Range   Order Confirmation      ORDER PROCESSED BY BLOOD BANK Performed at [facility], [place] [address], [place], [place] [address]   CBG monitoring, ED     Status: Abnormal   Collection Time: 01/22/22 12:32 PM  Result Value Ref Range   Glucose-Capillary 137 (H) 70 - 99 mg/dL   Recent Results (from the past 240 hour(s))  Resp Panel by RT-PCR (Flu A&B, Covid) Anterior Nasal Swab     Status: None   Collection Time: 01/22/22  1:10 AM    Specimen: Anterior Nasal Swab  Result Value Ref Range Status   SARS Coronavirus 2 by RT PCR NEGATIVE NEGATIVE Final    Comment: (NOTE) SARS-CoV-2 target nucleic acids are NOT DETECTED.  The SARS-CoV-2 RNA is generally detectable in upper respiratory specimens during the acute phase of infection. The lowest concentration of SARS-CoV-2 viral copies this assay can detect is 138 copies/mL. A negative result does not preclude SARS-Cov-2 infection and should not be used as the sole basis for treatment or other patient management decisions. A negative result may occur with  improper specimen collection/handling, submission of specimen other than nasopharyngeal swab, presence of viral mutation(s) within the areas targeted by this assay, and inadequate number of viral copies(<138 copies/mL). A negative result must be combined with clinical observations, patient history, and epidemiological information. The expected result is Negative.  Fact Sheet for Patients:  EntrepreneurPulse.com.au  Fact Sheet for Healthcare Providers:  IncredibleEmployment.be  This test is no t yet approved or cleared by the [place] FDA and  has been authorized for detection and/or diagnosis of SARS-CoV-2 by FDA under an Emergency Use Authorization (EUA). This EUA will remain  in effect (meaning this test can be used) for the duration of the COVID-19 declaration under Section 564(b)(1) of the Act, 21 U.S.C.section 360bbb-3(b)(1), unless the authorization is terminated  or revoked sooner.       Influenza A by PCR NEGATIVE NEGATIVE Final   Influenza B by PCR NEGATIVE NEGATIVE Final    Comment: (NOTE) The Xpert Xpress SARS-CoV-2/FLU/RSV plus assay is intended as an aid in the diagnosis of influenza from Nasopharyngeal swab specimens and should not be used as a sole basis for treatment. Nasal washings and aspirates are unacceptable for Xpert Xpress  SARS-CoV-2/FLU/RSV testing.  Fact Sheet for Patients: EntrepreneurPulse.com.au  Fact Sheet for Healthcare Providers: IncredibleEmployment.be  This test is not yet approved or cleared by the [place] FDA and has been authorized for detection and/or diagnosis of SARS-CoV-2 by FDA under an Emergency Use Authorization (EUA). This EUA will remain in effect (meaning this test can be used) for the duration of the COVID-19 declaration under Section 564(b)(1) of the Act, 21 U.S.C. section 360bbb-3(b)(1), unless the authorization is terminated or revoked.  Performed at [facility], [place] [address] [place], [place] [address]     Renal Function: Recent Labs    01/21/22 2305 01/22/22 0017 01/22/22 0506  CREATININE 6.46* 7.00* 6.70*   Estimated Creatinine Clearance: 4.9 mL/min (A) (by C-G formula based on SCr of 6.7  mg/dL (H)).  Radiologic Imaging: CT Renal Stone Study  Result Date: 01/22/2022 CLINICAL DATA:  Abdominal flank pain. EXAM: CT ABDOMEN AND PELVIS WITHOUT CONTRAST TECHNIQUE: Multidetector CT imaging of the abdomen and pelvis was performed following the standard protocol without IV contrast. RADIATION DOSE REDUCTION: This exam was performed according to the departmental dose-optimization program which includes automated exposure control, adjustment of the mA and/or kV according to patient size and/or use of iterative reconstruction technique. COMPARISON:  CT abdomen pelvis dated 11/12/2021. FINDINGS: Evaluation of this exam is limited in the absence of intravenous contrast. Lower chest: Partially visualized small left pleural effusion. There is coronary vascular calcification. No intra-abdominal free air or free fluid. Hepatobiliary: The liver is unremarkable. There is mild biliary dilatation, post cholecystectomy. Pancreas: The pancreas is atrophic and poorly visualized. Spleen: The spleen is unremarkable. Adrenals/Urinary Tract: The  right adrenal glands unremarkable. The left adrenal gland is not visualized. There has been interval development of a large left renal subcapsular hematoma extending into the perirenal space. The hematoma measures approximately 6.2 x 8.9 cm in greatest axial dimensions and 14 cm in craniocaudal length. There is mass effect and compression of the left renal parenchyma. Please note evaluation for possible active bleed is limited on this noncontrast CT. Similar positioning of the percutaneous drainage catheter and left pigtail ureteral stent. No hydronephrosis. Moderate right renal atrophy with several cysts and additional smaller hypodense lesions which are not characterized. There is no hydronephrosis on the right. The right ureter and urinary bladder appear unremarkable. Stomach/Bowel: Postsurgical changes of the distal stomach. The stomach is distended. High attenuating focus within the distal stomach likely ingested content. There is no bowel obstruction. The appendix is not visualized with certainty. No inflammatory changes identified in the right lower quadrant. Vascular/Lymphatic: Advanced aortoiliac atherosclerotic disease. The IVC is unremarkable. No portal venous gas. No obvious adenopathy. Reproductive: Hysterectomy. Other: None Musculoskeletal: Osteopenia with degenerative changes of the spine. No acute osseous pathology. IMPRESSION: 1. Large left renal subcapsular hematoma extending into the perirenal space. 2. Similar positioning of the percutaneous drainage catheter and left pigtail ureteral stent. No hydronephrosis. 3. No bowel obstruction. 4. Partially visualized small left pleural effusion. 5.  Aortic Atherosclerosis (ICD10-I70.0). These results were called by telephone at the time of interpretation on 01/22/2022 at 12:59 am to provider [facility] , who verbally acknowledged these results. Electronically Signed   By: Anner Crete M.D.   On: 01/22/2022 01:05   DG Chest Port 1 View  Result  Date: 01/22/2022 CLINICAL DATA:  Possible sepsis. EXAM: PORTABLE CHEST 1 VIEW COMPARISON:  10/29/2021. FINDINGS: The heart is enlarged and the mediastinal contour is within normal limits. Atherosclerotic calcification of the aorta is noted. Lung volumes are low with atelectasis at the lung bases. No definite effusion or pneumothorax. Surgical clips are noted in the cervical soft tissues on the right. No acute osseous abnormality. IMPRESSION: 1. Low lung volumes with atelectasis at the lung bases. 2. Cardiomegaly. Electronically Signed   By: Brett Fairy M.D.   On: 01/22/2022 00:30    I independently reviewed the above imaging studies.  Impression/Recommendation: 86 yo F, hx of urine leak after ablation of renal tumor managed with indwelling stent and drain, now with apparent renal bleed  --continue monitoring and trending H/H --transfuse PRN --if patient becomes unstable or intervention required, likely would recommend IR embolization  Donald Pore 01/22/2022, 12:38 PM  [facility]  Pager: [phone]

## 2022-01-22 NOTE — Progress Notes (Signed)
Called over at onset of HD -- BP low, initially SBP 80s -- gave 26m NS and SBP now 113's. 1U PRBC infusing, looks pale -> will order another 1U PRBCs too.  Low threshold to take her off dialysis if she starts looking worse, she is very sick.   KVeneta Penton PA-C CNewell RubbermaidPager [phone]

## 2022-01-22 NOTE — Progress Notes (Signed)
Received patient in bed to unit.  Alert and oriented.  Informed consent signed and in chart.   Treatment initiated: 1333 Treatment completed: 1606  Patient tolerated well.  Transported back to the room  Alert, without acute distress.  Hand-off given to patient's nurse.   Access used: Fistula Access issues: N/A  Total UF removed: -0.01 2 Units of blood were given      01/22/22 1606  Vitals  Temp (!) 97.2 F (36.2 C)  Temp Source Axillary  BP 108/88  MAP (mmHg) 96  Pulse Rate 100  ECG Heart Rate (!) 102  Resp (!) 33  Oxygen Therapy  SpO2 100 %  O2 Device Nasal Cannula  O2 Flow Rate (L/min) 5 L/min  Patient Activity (if Appropriate) In bed  Pulse Oximetry Type Continuous  Post Treatment  Dialyzer Clearance Lightly streaked  Duration of HD Treatment -hour(s) 2.15 hour(s)  Liters Processed 54.1  Fluid Removed (mL) -0.01 mL  Tolerated HD Treatment No (Comment)          [facility]

## 2022-01-22 NOTE — Inpatient Diabetes Management (Signed)
Inpatient Diabetes Program Recommendations  AACE/ADA: New Consensus Statement on Inpatient Glycemic Control (2015)  Target Ranges:  Prepandial:   less than 140 mg/dL      Peak postprandial:   less than 180 mg/dL (1-2 hours)      Critically ill patients:  140 - 180 mg/dL   Lab Results  Component Value Date   GLUCAP 256 (H) 01/22/2022   HGBA1C 6.1 (H) 11/02/2021    Review of Glycemic Control  Latest Reference Range & Units 01/22/22 05:14 01/22/22 08:34  Glucose-Capillary 70 - 99 mg/dL 334 (H)  Novolog 15 units 256 (H)  Novolog 11 units   Diabetes history: DM 2 Outpatient Diabetes medications: Novolog 10 units tid meal coverage Current orders for Inpatient glycemic control:  Novolog 0-20 units Q4 hours  A1c 6.1% on 11/02/2021  Inpatient Diabetes Program Recommendations:    -  Add Semglee 10 units  Thanks,  Tama Headings RN, MSN, BC-ADM Inpatient Diabetes Coordinator Team Pager [phone] (8a-5p)

## 2022-01-22 NOTE — Consult Note (Signed)
[facility] Renal Consultation Note    Indication for Consultation:  Management of ESRD/hemodialysis, anemia, hypertension/volume, and secondary hyperparathyroidism. PCP:  HPI: Kim Burns is a 86 y.o. female with ESRD, A-fib (on Eliquis), HTN, T2DM, and Hx  L renal leak/abscess formation s/p ablation of L RCC with indwelling drain to her L flank who is now admitted for sepsis + recurrent L renal hematoma.  S/p [facility] admit 10/2021 with L hydronephrosis and perinephric abscess/urinoma -> L ureter stent placed 9/13 by urology and JP drain placed 11/02/21 by IR. She was discharged home on IV Cefazolin course which she completed. Has followed up with IR as outpatient.  Presented to ED yesterday with progressive L flank/abdominal/back pain which has been ongoing x 2 weeks, but acutely worsened on the day of presentation. She had noticed intermittent blood in the L JP drain. No specific fevers, but had intermittent sweating episodes.   In the ED, her initial vitals looked ok. Labs with Na 138, K 5.4, BUN 62, Cr 6.7, Glu 379, Phos 6.8, Alb 2.3, LA 4.3 -> 5, WBC 20.1, Hgb 7.5. CXR clear. EKG with ST changes - cardiology consulted, felt more c/w demand ischemia in setting of infection. Abd CT showed "Large left renal subcapsular hematoma extending into the perirenal space." She was given IV fluids for sepsis, cultured, and started on Vanc/Cefepime/Flagyl. Urology consulted and she was admitted.  Today - in ED room, she reports that the pain is a little less. No CP, dyspnea. No N/V/D.  Dialyzes on MWF schedule at [facility] clinic - she did NOT have her usual HD yesterday. Denies any recent dialysis issues, her LUE AVF is functional.  Past Medical History:  Diagnosis Date   Anemia of chronic disease    takes iron   Anxiety    Blood transfusion without reported diagnosis    Bradycardia    Diabetes mellitus without complication ([place])    became diabetic after Whipple procedure    Diarrhea    Dysrhythmia 04/16/2016   bradycardia due to medication    ESRD on hemodialysis [facility])    M-W-F   GERD (gastroesophageal reflux disease)    Gout    Headache    History of kidney stones 06/2013   Hyperparathyroidism ([place])    Hypertension    PONV (postoperative nausea and vomiting)    Sleep apnea    no cpap machine. could not tolerate   Stroke ([place]) 04/16/2016   TIA 1995   Vitamin D deficiency    Past Surgical History:  Procedure Laterality Date   A/V FISTULAGRAM Left 08/30/2021   Procedure: A/V Fistulagram;  Surgeon: Marty Heck, MD;  Location: [place] [facility];  Service: Cardiovascular;  Laterality: Left;   ABDOMINAL HYSTERECTOMY  1985   complete   BACK SURGERY  0865   lower   BASCILIC VEIN TRANSPOSITION Left 04/24/2017   Procedure: LEFT ARM FIRST STAGE BASILIC VEIN TRANSPOSITION;  Surgeon: Serafina Mitchell, MD;  Location: [facility] OR;  Service: Vascular;  Laterality: Left;   [place] Left 07/10/2017   Procedure: SECOND STAGE BASILIC VEIN TRANSPOSITION LEFT ARM;  Surgeon: Serafina Mitchell, MD;  Location: [facility] OR;  Service: Vascular;  Laterality: Left;   BREAST LUMPECTOMY Left x 2   many years apart, benign   CHOLECYSTECTOMY  1985   CYSTOSCOPY W/ URETERAL STENT PLACEMENT Left 10/31/2021   Procedure: CYSTOSCOPY WITH RETROGRADE PYELOGRAM/URETERAL STENT PLACEMENT;  Surgeon: Ardis Hughs, MD;  Location: [place];  Service: Urology;  Laterality: Left;   CYSTOSCOPY WITH URETEROSCOPY AND STENT PLACEMENT Left 06/18/2013   Procedure: CYSTOSCOPY WITH Lef URETEROSCOPY AND Left STENT PLACEMENT;  Surgeon: Dutch Gray, MD;  Location: [facility];  Service: Urology;  Laterality: Left;   ESOPHAGOGASTRODUODENOSCOPY (EGD) WITH PROPOFOL N/A 11/13/2016   Procedure: ESOPHAGOGASTRODUODENOSCOPY (EGD) WITH PROPOFOL;  Surgeon: Otis Brace, MD;  Location: [place];  Service: Gastroenterology;  Laterality: N/A;   EUS N/A 02/07/2016   Procedure: ESOPHAGEAL ENDOSCOPIC  ULTRASOUND (EUS) RADIAL;  Surgeon: Arta Silence, MD;  Location: [facility];  Service: Endoscopy;  Laterality: N/A;   EYE SURGERY Bilateral 2014   ioc for catracts    FOOT SURGERY Left 1990   something with toes unsure what    HERNIA REPAIR  2018   IR EMBO TUMOR ORGAN ISCHEMIA INFARCT INC GUIDE ROADMAPPING  08/22/2021   IR RADIOLOGIST EVAL & MGMT  05/18/2021   IR RADIOLOGIST EVAL & MGMT  10/01/2021   IR RADIOLOGIST EVAL & MGMT  11/12/2021   IR RADIOLOGIST EVAL & MGMT  11/22/2021   IR RADIOLOGIST EVAL & MGMT  11/27/2021   IR RADIOLOGIST EVAL & MGMT  12/20/2021   IR RENAL SUPRASEL UNI S&I MOD SED  08/22/2021   IR US GUIDE VASC ACCESS RIGHT  08/22/2021   LEFT HEART CATH AND CORONARY ANGIOGRAPHY N/A 08/27/2021   Procedure: LEFT HEART CATH AND CORONARY ANGIOGRAPHY;  Surgeon: Burnell Blanks, MD;  Location: [place] [facility];  Service: Cardiovascular;  Laterality: N/A;   PARATHYROID EXPLORATION     PERIPHERAL VASCULAR BALLOON ANGIOPLASTY Left 08/30/2021   Procedure: PERIPHERAL VASCULAR BALLOON ANGIOPLASTY;  Surgeon: Marty Heck, MD;  Location: [place] [facility];  Service: Cardiovascular;  Laterality: Left;  arm fistula   RADIOLOGY WITH ANESTHESIA Left 08/22/2021   Procedure: MICROWAVE ABLATION;  Surgeon: Sandi Mariscal, MD;  Location: [facility];  Service: Anesthesiology;  Laterality: Left;   WHIPPLE PROCEDURE N/A 04/23/2016   Procedure: WHIPPLE PROCEDURE;  Surgeon: Stark Klein, MD;  Location: [place];  Service: General;  Laterality: N/A;   Family History  Problem Relation Age of Onset   Heart disease Mother    Stroke Mother    Cancer Father        colon   Alzheimer's disease Sister    Alzheimer's disease Sister    Cancer Brother        brain   Breast cancer Neg Hx    Social History:  reports that she has never smoked. She has never used smokeless tobacco. She reports that she does not drink alcohol and does not use drugs.  ROS: As per HPI otherwise negative.  Physical  Exam: Vitals:   01/22/22 0830 01/22/22 0915 01/22/22 0930 01/22/22 0945  BP: 120/61 114/61 (!) 101/55 (!) 106/55  Pulse: 79 79 82 83  Resp: (!) '22 17 15 '$ (!) 23  Temp:    98.6 F (37 C)  TempSrc:    Axillary  SpO2: (!) 84% 100% 99% 98%  Weight:      Height:         General: Well developed, well nourished, in no acute distress. [place] O2 in place Head: Normocephalic, atraumatic, sclera non-icteric, mucus membranes are moist. Neck: Supple without lymphadenopathy/masses. JVD not elevated. Lungs: Clear bilaterally to auscultation without wheezes, rales, or rhonchi. Breathing is unlabored. Heart: RRR with normal S1, S2. No murmurs, rubs, or gallops appreciated. Abdomen: Soft, non-tender. JP drain from L flank without much drainage in bag. Musculoskeletal:  Strength and tone appear normal for  age. Lower extremities: No edema or ischemic changes, no open wounds. Neuro: Alert and oriented X 3. Moves all extremities spontaneously. Psych:  Responds to questions appropriately with a normal affect. Dialysis Access: LUE AVF + bruit  Allergies  Allergen Reactions   Lopid [Gemfibrozil] Other (See Comments)    Myalgia    Lotemax [Loteprednol Etabonate] Rash    Skin burning    Statins Other (See Comments)    Muscle weakness   Norco [Hydrocodone-Acetaminophen] Other (See Comments)    Headaches  Tolerates acetaminophen    Other Diarrhea    Real Butter - diarrhea   Tomato Other (See Comments)    Belching    Vibra-Tab [Doxycycline] Other (See Comments)    Unknown reaction   Codeine Other (See Comments)    headache   Hydrocodone-Acetaminophen Other (See Comments)    Headache. Tolerates acetaminophen.   Prior to Admission medications   Medication Sig Start Date End Date Taking? Authorizing Provider  acetaminophen (TYLENOL 8 HOUR ARTHRITIS PAIN) 650 MG CR tablet Take 1,300 mg by mouth daily as needed for pain.    [provider]  amiodarone (PACERONE) 200 MG tablet Take 0.5 tablets (100  mg total) by mouth daily. Please keep appointment for further refills 11/23/21   [place], Peter M, MD  apixaban (ELIQUIS) 2.5 MG TABS tablet Take 1 tablet (2.5 mg total) by mouth 2 (two) times daily. 09/25/21 03/24/22  [place], Peter M, MD  carvedilol (COREG) 3.125 MG tablet Take 1 tablet (3.125 mg total) by mouth 2 (two) times daily with a meal. Take on the days you do not have dialysis 11/04/21   Mercy Riding, MD  Cholecalciferol (VITAMIN D-3 PO) Take 1 capsule by mouth every Monday, Wednesday, and Friday with hemodialysis.    [provider]  colestipol (COLESTID) 1 g tablet Take 1 g by mouth See admin instructions. 1 tablet (1g) prior to dialysis M-W-F + 1 tablet daily PRN diarrhea    [provider]  diclofenac sodium (VOLTAREN) 1 % GEL Apply 2 g topically 4 (four) times daily as needed (for hand pain).  Patient not taking: Reported on 11/23/2021    [provider]  insulin aspart (NOVOLOG FLEXPEN) 100 UNIT/ML FlexPen Inject 10 Units into the skin 3 (three) times daily as needed (CBG > 200).    [provider]  lidocaine-prilocaine (EMLA) cream Apply 1 Application topically every Monday, Wednesday, and Friday with hemodialysis.    [provider]  midodrine (PROAMATINE) 10 MG tablet Take 10 mg by mouth as directed. 09/27/21   [provider]  polyethylene glycol powder (MIRALAX) 17 GM/SCOOP powder Take 17 g by mouth 2 (two) times daily as needed for moderate constipation. Patient not taking: Reported on 11/23/2021 11/04/21   Mercy Riding, MD  RESTASIS 0.05 % ophthalmic emulsion 1 drop 2 (two) times daily. 11/06/21   [provider]  senna-docusate (SENOKOT-S) 8.6-50 MG tablet Take 1 tablet by mouth 2 (two) times daily between meals as needed for mild constipation. Patient not taking: Reported on 11/23/2021 11/04/21   Mercy Riding, MD  sevelamer carbonate (RENVELA) 800 MG tablet Take 1,600 mg by mouth 3 (three) times daily with meals.    [provider]  sodium chloride flush (NS) 0.9 % SOLN 5 mLs by Intracatheter route every 8 (eight) hours. Patient not taking: Reported on 11/23/2021 11/04/21   Mercy Riding, MD  XIIDRA 5 % SOLN Place 1 drop into both eyes at bedtime. Patient not  taking: Reported on 11/23/2021 09/24/21   [provider]   Current Facility-Administered Medications  Medication Dose Route Frequency Provider Last Rate Last Admin   0.9 %  sodium chloride infusion (Manually program via Guardrails IV Fluids)   Intravenous Once Kc, [place], MD       acetaminophen (TYLENOL) tablet 650 mg  650 mg Oral Q6H PRN Kayleen Memos, DO       [START ON 01/23/2022] ceFEPIme (MAXIPIME) 1 g in sodium chloride 0.9 % 100 mL IVPB  1 g Intravenous Q24H Bertis Ruddy, RPH       HYDROmorphone (DILAUDID) injection 0.5 mg  0.5 mg Intravenous Q4H PRN Irene Pap N, DO   0.5 mg at 01/22/22 0902   insulin aspart (novoLOG) injection 0-20 Units  0-20 Units Subcutaneous Q4H Kayleen Memos, DO   11 Units at 01/22/22 7342   lactated ringers infusion   Intravenous Continuous Irene Pap N, DO 50 mL/hr at 01/22/22 0451 New Bag at 01/22/22 0451   metroNIDAZOLE (FLAGYL) tablet 500 mg  500 mg Oral Q12H Kc, Maren [place], MD   500 mg at 01/22/22 1007   oxyCODONE (Oxy IR/ROXICODONE) immediate release tablet 5 mg  5 mg Oral Q6H PRN Irene Pap N, DO       polyethylene glycol (MIRALAX / GLYCOLAX) packet 17 g  17 g Oral Daily PRN Irene Pap N, DO       prochlorperazine (COMPAZINE) injection 5 mg  5 mg Intravenous Q6H PRN Hall, Carole N, DO       senna (SENOKOT) tablet 8.6 mg  1 tablet Oral QHS Kayleen Memos, DO       Current Outpatient Medications  Medication Sig Dispense Refill   acetaminophen (TYLENOL 8 HOUR ARTHRITIS PAIN) 650 MG CR tablet Take 1,300 mg by mouth daily as needed for pain.     amiodarone (PACERONE) 200 MG tablet Take 0.5 tablets (100 mg total) by mouth daily. Please keep appointment for further refills 90 tablet 3   apixaban (ELIQUIS)  2.5 MG TABS tablet Take 1 tablet (2.5 mg total) by mouth 2 (two) times daily. 60 tablet 5   carvedilol (COREG) 3.125 MG tablet Take 1 tablet (3.125 mg total) by mouth 2 (two) times daily with a meal. Take on the days you do not have dialysis 60 tablet 1   Cholecalciferol (VITAMIN D-3 PO) Take 1 capsule by mouth every Monday, Wednesday, and Friday with hemodialysis.     colestipol (COLESTID) 1 g tablet Take 1 g by mouth See admin instructions. 1 tablet (1g) prior to dialysis M-W-F + 1 tablet daily PRN diarrhea     diclofenac sodium (VOLTAREN) 1 % GEL Apply 2 g topically 4 (four) times daily as needed (for hand pain).  (Patient not taking: Reported on 11/23/2021)     insulin aspart (NOVOLOG FLEXPEN) 100 UNIT/ML FlexPen Inject 10 Units into the skin 3 (three) times daily as needed (CBG > 200).     lidocaine-prilocaine (EMLA) cream Apply 1 Application topically every Monday, Wednesday, and Friday with hemodialysis.     midodrine (PROAMATINE) 10 MG tablet Take 10 mg by mouth as directed.     polyethylene glycol powder (MIRALAX) 17 GM/SCOOP powder Take 17 g by mouth 2 (two) times daily as needed for moderate constipation. (Patient not taking: Reported on 11/23/2021) 255 g 0   RESTASIS 0.05 % ophthalmic emulsion 1 drop 2 (two) times daily.     senna-docusate (SENOKOT-S) 8.6-50 MG tablet Take 1 tablet by mouth 2 (two)  times daily between meals as needed for mild constipation. (Patient not taking: Reported on 11/23/2021) 180 tablet 0   sevelamer carbonate (RENVELA) 800 MG tablet Take 1,600 mg by mouth 3 (three) times daily with meals.     sodium chloride flush (NS) 0.9 % SOLN 5 mLs by Intracatheter route every 8 (eight) hours. (Patient not taking: Reported on 11/23/2021)     XIIDRA 5 % SOLN Place 1 drop into both eyes at bedtime. (Patient not taking: Reported on 11/23/2021)     Labs: Basic Metabolic Panel: Recent Labs  Lab 01/21/22 2305 01/22/22 0017 01/22/22 0506  NA 136 136 138  K 4.3 4.3 5.4*  CL 104  105 105  CO2 19*  --  20*  GLUCOSE 378* 425* 379*  BUN 59* 58* 62*  CREATININE 6.46* 7.00* 6.70*  CALCIUM 8.7*  --  9.0  PHOS  --   --  6.8*   Liver Function Tests: Recent Labs  Lab 01/21/22 2305 01/22/22 0506  AST 37 28  ALT 52* 47*  ALKPHOS 55 45  BILITOT 0.4 0.4  PROT 5.6* 5.2*  ALBUMIN 2.4* 2.3*   CBC: Recent Labs  Lab 01/21/22 2305 01/22/22 0017 01/22/22 0506  WBC 23.6*  --  20.1*  NEUTROABS 19.8*  --   --   HGB 9.0* 9.5* 7.5*  HCT 29.5* 28.0* 25.5*  MCV 91.3  --  93.8  PLT 325  --  294   Studies/Results: CT Renal Stone Study  Result Date: 01/22/2022 CLINICAL DATA:  Abdominal flank pain. EXAM: CT ABDOMEN AND PELVIS WITHOUT CONTRAST TECHNIQUE: Multidetector CT imaging of the abdomen and pelvis was performed following the standard protocol without IV contrast. RADIATION DOSE REDUCTION: This exam was performed according to the departmental dose-optimization program which includes automated exposure control, adjustment of the mA and/or kV according to patient size and/or use of iterative reconstruction technique. COMPARISON:  CT abdomen pelvis dated 11/12/2021. FINDINGS: Evaluation of this exam is limited in the absence of intravenous contrast. Lower chest: Partially visualized small left pleural effusion. There is coronary vascular calcification. No intra-abdominal free air or free fluid. Hepatobiliary: The liver is unremarkable. There is mild biliary dilatation, post cholecystectomy. Pancreas: The pancreas is atrophic and poorly visualized. Spleen: The spleen is unremarkable. Adrenals/Urinary Tract: The right adrenal glands unremarkable. The left adrenal gland is not visualized. There has been interval development of a large left renal subcapsular hematoma extending into the perirenal space. The hematoma measures approximately 6.2 x 8.9 cm in greatest axial dimensions and 14 cm in craniocaudal length. There is mass effect and compression of the left renal parenchyma. Please  note evaluation for possible active bleed is limited on this noncontrast CT. Similar positioning of the percutaneous drainage catheter and left pigtail ureteral stent. No hydronephrosis. Moderate right renal atrophy with several cysts and additional smaller hypodense lesions which are not characterized. There is no hydronephrosis on the right. The right ureter and urinary bladder appear unremarkable. Stomach/Bowel: Postsurgical changes of the distal stomach. The stomach is distended. High attenuating focus within the distal stomach likely ingested content. There is no bowel obstruction. The appendix is not visualized with certainty. No inflammatory changes identified in the right lower quadrant. Vascular/Lymphatic: Advanced aortoiliac atherosclerotic disease. The IVC is unremarkable. No portal venous gas. No obvious adenopathy. Reproductive: Hysterectomy. Other: None Musculoskeletal: Osteopenia with degenerative changes of the spine. No acute osseous pathology. IMPRESSION: 1. Large left renal subcapsular hematoma extending into the perirenal space. 2. Similar positioning of the percutaneous drainage  catheter and left pigtail ureteral stent. No hydronephrosis. 3. No bowel obstruction. 4. Partially visualized small left pleural effusion. 5.  Aortic Atherosclerosis (ICD10-I70.0). These results were called by telephone at the time of interpretation on 01/22/2022 at 12:59 am to provider [facility] , who verbally acknowledged these results. Electronically Signed   By: Anner Crete M.D.   On: 01/22/2022 01:05   DG Chest Port 1 View  Result Date: 01/22/2022 CLINICAL DATA:  Possible sepsis. EXAM: PORTABLE CHEST 1 VIEW COMPARISON:  10/29/2021. FINDINGS: The heart is enlarged and the mediastinal contour is within normal limits. Atherosclerotic calcification of the aorta is noted. Lung volumes are low with atelectasis at the lung bases. No definite effusion or pneumothorax. Surgical clips are noted in the cervical soft  tissues on the right. No acute osseous abnormality. IMPRESSION: 1. Low lung volumes with atelectasis at the lung bases. 2. Cardiomegaly. Electronically Signed   By: Brett Fairy M.D.   On: 01/22/2022 00:30    Dialysis Orders:  MWF at [facility] 3:45hr, 350/A1.5, EDW 58kg, 2K/2Ca, AVF, 16g needles, no heparin - Calcitriol 1.66mg PO q HD - No recent ESA - was on hold for Hgb 12.9 on 01/16/22 - Binders: Renvela 2/meals.  Assessment/Plan:  L renal hematoma: In setting of prior urinoma s/p RCC ablation. Hematoma is not communicating with current JP drain as her bag is dry. Urology already consulted. For 1U PRBCs today. Sepsis: WBC and LA very high - started on IV abx - possibly going to ICU.  ESRD: Usual MWF schedule - was not dialyzed yesterday -> for HD today then get back on schedule later this week. NO heparin.  Hypertension/volume: BP low sided, no edema on exam. Minimal UF goal today.  Anemia of ESRD + ABLA: Hgb dropped from 12.9 last week ->7.5 today. For 1U PRBCs today, Eliquis on hold.  Metabolic bone disease: CorrCa high, Phos high. Hold VDRA for now, resume home binders.  Nutrition:  Alb low, add supplements.  T2DM  A-fib: Home Eliquis on hold.  KVeneta Penton PA-C 01/22/2022, 11:57 AM  [employer]

## 2022-01-22 NOTE — ED Notes (Signed)
Called 5W and requested purple man be initiated.

## 2022-01-22 NOTE — Progress Notes (Signed)
Pharmacy Antibiotic Note  Kim Burns is a 86 y.o. female admitted on 01/21/2022 presenting with back pain, hx nephrostomy drain, concern for infection.  Pharmacy has been consulted for cefepime dosing.  ESRD-HD usually MWF  Plan: Cefepime 2g IV x 1, then 1g IV every 24 hours Monitor renal function, Cx and clinical progression to narrow   Height: '5\' 2"'$  (157.5 cm) Weight: 58 kg (127 lb 13.9 oz) IBW/kg (Calculated) : 50.1  No data recorded.  Recent Labs  Lab 01/21/22 2305  WBC 23.6*  CREATININE 6.46*    Estimated Creatinine Clearance: 5.1 mL/min (A) (by C-G formula based on SCr of 6.46 mg/dL (H)).    Allergies  Allergen Reactions   Lopid [Gemfibrozil] Other (See Comments)    Myalgia    Lotemax [Loteprednol Etabonate] Rash    Skin burning    Statins Other (See Comments)    Muscle weakness   Norco [Hydrocodone-Acetaminophen] Other (See Comments)    Headaches  Tolerates acetaminophen    Other Diarrhea    Real Butter - diarrhea   Tomato Other (See Comments)    Belching    Vibra-Tab [Doxycycline] Other (See Comments)    Unknown reaction   Codeine Other (See Comments)    headache   Hydrocodone-Acetaminophen Other (See Comments)    Headache. Tolerates acetaminophen.    Bertis Ruddy, PharmD, [facility] Clinical Pharmacist ED Pharmacist Phone # [phone] 01/22/2022 12:17 AM

## 2022-01-23 ENCOUNTER — Telehealth: Payer: Self-pay | Admitting: Cardiology

## 2022-01-23 ENCOUNTER — Inpatient Hospital Stay (HOSPITAL_COMMUNITY): Payer: Medicare PPO

## 2022-01-23 DIAGNOSIS — A419 Sepsis, unspecified organism: Secondary | ICD-10-CM | POA: Diagnosis not present

## 2022-01-23 DIAGNOSIS — S37012A Minor contusion of left kidney, initial encounter: Secondary | ICD-10-CM | POA: Diagnosis not present

## 2022-01-23 LAB — CBC
HCT: 25.4 % — ABNORMAL LOW (ref 36.0–46.0)
HCT: 24 % — ABNORMAL LOW (ref 36.0–46.0)
Hemoglobin: 8.4 g/dL — ABNORMAL LOW (ref 12.0–15.0)
Hemoglobin: 8.1 g/dL — ABNORMAL LOW (ref 12.0–15.0)
MCH: 28.7 pg (ref 26.0–34.0)
MCH: 29.3 pg (ref 26.0–34.0)
MCHC: 33.1 g/dL (ref 30.0–36.0)
MCHC: 33.8 g/dL (ref 30.0–36.0)
MCV: 86.7 fL (ref 80.0–100.0)
MCV: 87 fL (ref 80.0–100.0)
Platelets: 179 10*3/uL (ref 150–400)
Platelets: 165 10*3/uL (ref 150–400)
RBC: 2.93 MIL/uL — ABNORMAL LOW (ref 3.87–5.11)
RBC: 2.76 MIL/uL — ABNORMAL LOW (ref 3.87–5.11)
RDW: 17.2 % — ABNORMAL HIGH (ref 11.5–15.5)
RDW: 17.6 % — ABNORMAL HIGH (ref 11.5–15.5)
WBC: 11.8 10*3/uL — ABNORMAL HIGH (ref 4.0–10.5)
WBC: 11.7 10*3/uL — ABNORMAL HIGH (ref 4.0–10.5)
nRBC: 0 % (ref 0.0–0.2)
nRBC: 0 % (ref 0.0–0.2)

## 2022-01-23 LAB — BASIC METABOLIC PANEL
Anion gap: 15 (ref 5–15)
BUN: 38 mg/dL — ABNORMAL HIGH (ref 8–23)
CO2: 26 mmol/L (ref 22–32)
Calcium: 9 mg/dL (ref 8.9–10.3)
Chloride: 96 mmol/L — ABNORMAL LOW (ref 98–111)
Creatinine, Ser: 4.7 mg/dL — ABNORMAL HIGH (ref 0.44–1.00)
GFR, Estimated: 9 mL/min — ABNORMAL LOW (ref >60)
Glucose, Bld: 129 mg/dL — ABNORMAL HIGH (ref 70–99)
Potassium: 3.7 mmol/L (ref 3.5–5.1)
Sodium: 137 mmol/L (ref 135–145)

## 2022-01-23 LAB — TYPE AND SCREEN
ABO/RH(D): A POS
Antibody Screen: NEGATIVE
Unit division: 0
Unit division: 0

## 2022-01-23 LAB — GLUCOSE, CAPILLARY
Glucose-Capillary: 111 mg/dL — ABNORMAL HIGH (ref 70–99)
Glucose-Capillary: 120 mg/dL — ABNORMAL HIGH (ref 70–99)
Glucose-Capillary: 135 mg/dL — ABNORMAL HIGH (ref 70–99)
Glucose-Capillary: 147 mg/dL — ABNORMAL HIGH (ref 70–99)
Glucose-Capillary: 196 mg/dL — ABNORMAL HIGH (ref 70–99)
Glucose-Capillary: 96 mg/dL (ref 70–99)

## 2022-01-23 LAB — LACTIC ACID, PLASMA: Lactic Acid, Venous: 1.5 mmol/L (ref 0.5–1.9)

## 2022-01-23 LAB — HEMOGLOBIN A1C
Hgb A1c MFr Bld: 6.6 % — ABNORMAL HIGH (ref 4.8–5.6)
Mean Plasma Glucose: 143 mg/dL

## 2022-01-23 LAB — BPAM RBC
Blood Product Expiration Date: 202312202359
Blood Product Expiration Date: 202312202359
ISSUE DATE / TIME: 202312051350
ISSUE DATE / TIME: 202312051514
Unit Type and Rh: 6200
Unit Type and Rh: 6200

## 2022-01-23 LAB — HEPATITIS B SURFACE ANTIBODY, QUANTITATIVE: Hep B S AB Quant (Post): <3.1 m[IU]/mL — ABNORMAL LOW (ref >9.9)

## 2022-01-23 MED ORDER — PANTOPRAZOLE SODIUM 40 MG PO TBEC
40.0000 mg | DELAYED_RELEASE_TABLET | Freq: Every day | ORAL | Status: DC
  Administered 2022-01-23 – 2022-01-31 (×9): 40 mg via ORAL
  Filled 2022-01-23 (×9): qty 1

## 2022-01-23 MED ORDER — DARBEPOETIN ALFA 100 MCG/0.5ML IJ SOSY
100.0000 ug | PREFILLED_SYRINGE | INTRAMUSCULAR | Status: DC
  Filled 2022-01-23: qty 0.5

## 2022-01-23 MED ORDER — AMIODARONE HCL 200 MG PO TABS
100.0000 mg | ORAL_TABLET | Freq: Every day | ORAL | Status: DC
  Administered 2022-01-23 – 2022-01-31 (×9): 100 mg via ORAL
  Filled 2022-01-23 (×9): qty 1

## 2022-01-23 MED ORDER — PANCRELIPASE (LIP-PROT-AMYL) 36000-114000 UNITS PO CPEP
36000.0000 [IU] | ORAL_CAPSULE | Freq: Two times a day (BID) | ORAL | Status: DC
  Administered 2022-01-23 – 2022-01-28 (×8): 36000 [IU] via ORAL
  Filled 2022-01-23 (×10): qty 1

## 2022-01-23 MED ORDER — PROSOURCE PLUS PO LIQD
30.0000 mL | Freq: Two times a day (BID) | ORAL | Status: DC
  Administered 2022-01-23 – 2022-01-24 (×2): 30 mL via ORAL
  Filled 2022-01-23 (×6): qty 30

## 2022-01-23 MED ORDER — ROPINIROLE HCL 0.25 MG PO TABS
0.2500 mg | ORAL_TABLET | Freq: Every day | ORAL | Status: DC
  Administered 2022-01-23 – 2022-01-24 (×2): 0.25 mg via ORAL
  Filled 2022-01-23 (×3): qty 1

## 2022-01-23 MED ORDER — CYCLOSPORINE 0.05 % OP EMUL
1.0000 [drp] | Freq: Two times a day (BID) | OPHTHALMIC | Status: DC
  Administered 2022-01-23 – 2022-01-31 (×13): 1 [drp] via OPHTHALMIC
  Filled 2022-01-23 (×20): qty 30

## 2022-01-23 MED ORDER — IOHEXOL 350 MG/ML SOLN
75.0000 mL | Freq: Once | INTRAVENOUS | Status: AC | PRN
  Administered 2022-01-23: 75 mL via INTRAVENOUS

## 2022-01-23 NOTE — Progress Notes (Signed)
PROGRESS NOTE                                                                                                                                                                                                             Patient Demographics:    Kim Burns, is a 86 y.o. female, DOB - 23-Jan-1936, QMG:[id number]  Outpatient Primary MD for the patient is Kim Bien, MD    LOS - 1  Admit date - 01/21/2022    Chief Complaint  Patient presents with   Back Pain       Brief Narrative (HPI from H&P)   86 year old female history of renal cell mass status post nephrostomy tube, ureteral stent, ESRD on HD MWF PAF on Eliquis, chronic diastolic CHF nonobstructive CAD, previous CVA presents with severe back pain found to have subcapsular hematoma.   See was having intermittent hematuria x3 wk,returned from a trip from [place] 12/4 and started having severe diffuse back pain during night so came to the ED.   In the ED had abnormal EKG with subtle inferior ST elevation and ST depression in lateral lead EDP discussed with on-call cardiology Dr. Irish Lack patient generally clean cath in July 2023 with 20% stenosis of RCA-advised holding off on cath and continue to repeat EKG follow-up troponins> +/- heparin if significant bump in troponin.  Labs showed significant leukocytosis, elevated creatinine 6.4, troponin 25 > 28 hemoglobin 9.0> 9.5>7.,5 blood glucose 425, lactic acidosis 5.7> 5.4> 4.3.CT renal study showed large left renal subcapsular hematoma extending into the perirenal space left pigtail ureteral stent in place, no hydronephrosis, urology  Dr Cain Sieve was consulted> advised that she does  Kcentra was given to reverse Eliquis.  Patient was started on IV antibiotics blood cultures and also start gentle IV fluids and admitted.   Subjective:    Kim Burns today has, No headache, No chest pain, No abdominal pain - No Nausea, No new  weakness tingling or numbness, no SOB.   Assessment  & Plan :    Large left renal subcapsular hematoma. Intermittent hematuria x3 weeks. S/p left tumor ablation c/b urine leak-refractory to management and with an indwelling stent and nephrostomy tube: Hemoglobin drifting down, has been on movement and exam, CT show.  She is s/p 2 units of packed RBC transfusion, also received Kcentra on 01/22/2022, bleeding seems to  have held for now.  Seen by urology and IR.  Continue to monitor CBC.  Continue to hold anticoagulation.   Acute blood loss anemia, Anemia of chronic renal disease: Currently see above.  Suspected Severe Sepsis POA-given patient's lactic acidosis/leukocytosis, possible intra-abdominal source. History of left perinephric abscess: Keep on IV antibiotics include cephalosporin and Flagyl started earlier.  Sepsis pathophysiology seems to have resolved.  Continue to monitor.  Follow cultures.  ESRD on HD MWF: Nephrology following HD per schedule.  Previous CVA, PAF on Eliquis: Eliquis discontinued,but will need to d/w risks/benefits;given her stroke history she is high risk w/ CHA2DS2-VASc Score = 7,  this was explained to patient and patient's son Kim Burns.  Upon family request cardiology team also requested to follow, home dose amiodarone continued, Coreg held as blood pressure soft,  Chronic diastolic CHF, EF 35% on recent echocardiogram: Compensated now.  Hypertension - blood pressure borderline if improves will try to add back home dose Coreg.  Type 2 diabetes mellitus with uncontrolled hyperglycemia, on long-term insulin: Continue SSI and monitor   CBG (last 3)  Recent Labs    01/23/22 0022 01/23/22 0420 01/23/22 0806  GLUCAP 111* 120* 147*        Condition - Extremely Guarded  Family Communication  :    [name] [phone]  - 01/23/22  Code Status :  Full  Consults  :  Urology, PCCM , IR, Renal, cardiology  PUD Prophylaxis : PPI   Procedures  :     CT - 1.  Large left renal subcapsular hematoma extending into the perirenal space. 2. Similar positioning of the percutaneous drainage catheter and left pigtail ureteral stent. No hydronephrosis. 3. No bowel obstruction. 4. Partially visualized small left pleural effusion. 5.  Aortic Atherosclerosis.      Disposition Plan  :    Status is: Inpatient  DVT Prophylaxis  :    Place and maintain sequential compression device Start: 01/23/22 0601 SCDs Start: 01/22/22 0227    Lab Results  Component Value Date   PLT 179 01/23/2022    Diet :  Diet Order             Diet clear liquid Room service appropriate? Yes; Fluid consistency: Thin  Diet effective now                    Inpatient Medications  Scheduled Meds:  sodium chloride   Intravenous Once   sodium chloride   Intravenous Once   insulin aspart  0-9 Units Subcutaneous Q4H   metroNIDAZOLE  500 mg Oral Q12H   senna  1 tablet Oral QHS   sevelamer carbonate  1,600 mg Oral TID WC   Continuous Infusions:  ceFEPime (MAXIPIME) IV 1 g (01/23/22 0208)   PRN Meds:.acetaminophen, dextrose, HYDROmorphone (DILAUDID) injection, oxyCODONE, polyethylene glycol, prochlorperazine       Objective:   Vitals:   01/22/22 2305 01/22/22 2345 01/23/22 0344 01/23/22 0800  BP:  (!) 142/64 (!) 155/74 (!) 153/75  Pulse:  (!) 104 (!) 103 (!) 101  Resp:  '18 20 20  '$ Temp: 98 F (36.7 C) 98.3 F (36.8 C) 98.8 F (37.1 C) 98.6 F (37 C)  TempSrc: Oral Oral Oral Oral  SpO2:  100% 99%   Weight:      Height:        Wt Readings from Last 3 Encounters:  01/21/22 58 kg  11/23/21 58.8 kg  11/07/21 58.5 kg     Intake/Output Summary (Last 24  hours) at 01/23/2022 1005 Last data filed at 01/22/2022 2355 Gross per 24 hour  Intake 1230 ml  Output -0.01 ml  Net 1230.01 ml     Physical Exam  Awake Alert, No new F.N deficits, Normal affect [place].AT,PERRAL Supple Neck, No JVD,   Symmetrical Chest wall movement, Good air movement bilaterally,  CTAB RRR,No Gallops,Rubs or new Murmurs,  +ve B.Sounds, Abd Soft, No tenderness,   No Cyanosis, Clubbing or edema         Data Review:    Recent Labs  Lab 01/21/22 2305 01/22/22 0017 01/22/22 0506 01/22/22 1243 01/22/22 1257 01/22/22 1746 01/23/22 0422  WBC 23.6*  --  20.1*  --   --   --  11.8*  HGB 9.0*   < > 7.5* 6.9* 7.5* 10.1* 8.4*  HCT 29.5*   < > 25.5* 22.9* 22.0* 30.8* 25.4*  PLT 325  --  294  --   --   --  179  MCV 91.3  --  93.8  --   --   --  86.7  MCH 27.9  --  27.6  --   --   --  28.7  MCHC 30.5  --  29.4*  --   --   --  33.1  RDW 18.8*  --  19.0*  --   --   --  17.2*  LYMPHSABS 2.7  --   --   --   --   --   --   MONOABS 0.6  --   --   --   --   --   --   EOSABS 0.1  --   --   --   --   --   --   BASOSABS 0.1  --   --   --   --   --   --    < > = values in this interval not displayed.    Recent Labs  Lab 01/21/22 2305 01/22/22 0001 01/22/22 0017 01/22/22 0038 01/22/22 0506 01/22/22 0545 01/22/22 0857 01/22/22 1243 01/22/22 1257 01/22/22 1745 01/23/22 0422  NA 136  --  136  --  138  --   --   --  138  --  137  K 4.3  --  4.3  --  5.4*  --   --   --  4.8  --  3.7  CL 104  --  105  --  105  --   --   --   --   --  96*  CO2 19*  --   --   --  20*  --   --   --   --   --  26  GLUCOSE 378*  --  425*  --  379*  --   --   --   --   --  129*  BUN 59*  --  58*  --  62*  --   --   --   --   --  38*  CREATININE 6.46*  --  7.00*  --  6.70*  --   --   --   --   --  4.70*  AST 37  --   --   --  28  --   --   --   --   --   --   ALT 52*  --   --   --  47*  --   --   --   --   --   --  ALKPHOS 55  --   --   --  45  --   --   --   --   --   --   BILITOT 0.4  --   --   --  0.4  --   --   --   --   --   --   ALBUMIN 2.4*  --   --   --  2.3*  --   --   --   --   --   --   LATICACIDVEN  --    < >  --  5.7*  --  4.3* 5.0* 3.5*  --  3.0* 1.5  INR  --   --   --  1.4*  --   --   --   --   --   --   --   HGBA1C  --   --   --   --  6.6*  --   --   --   --   --   --   MG   --   --   --   --  2.8*  --   --   --   --   --   --   CALCIUM 8.7*  --   --   --  9.0  --   --   --   --   --  9.0   < > = values in this interval not displayed.      Radiology Reports CT Renal Stone Study  Result Date: 01/22/2022 CLINICAL DATA:  Abdominal flank pain. EXAM: CT ABDOMEN AND PELVIS WITHOUT CONTRAST TECHNIQUE: Multidetector CT imaging of the abdomen and pelvis was performed following the standard protocol without IV contrast. RADIATION DOSE REDUCTION: This exam was performed according to the departmental dose-optimization program which includes automated exposure control, adjustment of the mA and/or kV according to patient size and/or use of iterative reconstruction technique. COMPARISON:  CT abdomen pelvis dated 11/12/2021. FINDINGS: Evaluation of this exam is limited in the absence of intravenous contrast. Lower chest: Partially visualized small left pleural effusion. There is coronary vascular calcification. No intra-abdominal free air or free fluid. Hepatobiliary: The liver is unremarkable. There is mild biliary dilatation, post cholecystectomy. Pancreas: The pancreas is atrophic and poorly visualized. Spleen: The spleen is unremarkable. Adrenals/Urinary Tract: The right adrenal glands unremarkable. The left adrenal gland is not visualized. There has been interval development of a large left renal subcapsular hematoma extending into the perirenal space. The hematoma measures approximately 6.2 x 8.9 cm in greatest axial dimensions and 14 cm in craniocaudal length. There is mass effect and compression of the left renal parenchyma. Please note evaluation for possible active bleed is limited on this noncontrast CT. Similar positioning of the percutaneous drainage catheter and left pigtail ureteral stent. No hydronephrosis. Moderate right renal atrophy with several cysts and additional smaller hypodense lesions which are not characterized. There is no hydronephrosis on the right. The right  ureter and urinary bladder appear unremarkable. Stomach/Bowel: Postsurgical changes of the distal stomach. The stomach is distended. High attenuating focus within the distal stomach likely ingested content. There is no bowel obstruction. The appendix is not visualized with certainty. No inflammatory changes identified in the right lower quadrant. Vascular/Lymphatic: Advanced aortoiliac atherosclerotic disease. The IVC is unremarkable. No portal venous gas. No obvious adenopathy. Reproductive: Hysterectomy. Other: None Musculoskeletal: Osteopenia with degenerative changes of the spine. No acute osseous pathology. IMPRESSION: 1. Large left renal subcapsular  hematoma extending into the perirenal space. 2. Similar positioning of the percutaneous drainage catheter and left pigtail ureteral stent. No hydronephrosis. 3. No bowel obstruction. 4. Partially visualized small left pleural effusion. 5.  Aortic Atherosclerosis (ICD10-I70.0). These results were called by telephone at the time of interpretation on 01/22/2022 at 12:59 am to provider [facility] , who verbally acknowledged these results. Electronically Signed   By: Anner Crete M.D.   On: 01/22/2022 01:05   DG Chest Port 1 View  Result Date: 01/22/2022 CLINICAL DATA:  Possible sepsis. EXAM: PORTABLE CHEST 1 VIEW COMPARISON:  10/29/2021. FINDINGS: The heart is enlarged and the mediastinal contour is within normal limits. Atherosclerotic calcification of the aorta is noted. Lung volumes are low with atelectasis at the lung bases. No definite effusion or pneumothorax. Surgical clips are noted in the cervical soft tissues on the right. No acute osseous abnormality. IMPRESSION: 1. Low lung volumes with atelectasis at the lung bases. 2. Cardiomegaly. Electronically Signed   By: Brett Fairy M.D.   On: 01/22/2022 00:30      Signature  -   Lala Lund M.D on 01/23/2022 at 10:05 AM   -  To page go to [email or web address]

## 2022-01-23 NOTE — Progress Notes (Signed)
TRH night cross cover note:   I was notified by RN of the patient's most recent CBG of 64, this was relative to 75 at 1745.  Asymptomatic.  She was given a snack, with repeat Accu-Chek improving.  Per chart review, her blood sugars earlier today were around 100.  On the morning of 01/22/2022, she received a total of 26 units of short acting insulin.  It appears that is not patient, she is not on a basal insulin, but does state sliding scale.  Most recent hemoglobin A1c 6.1% in September 2023.  She has also received interval IV fluids following initial blood sugars in the 400s.  I subsequently changed her current CBG/sliding scale insulin ordered from every 4 hours CBGs associated with resistant dosing NovoLog to every 4 hours CBG monitoring with sensitive dose sliding scale insulin.      Kim Bertin, DO Hospitalist

## 2022-01-23 NOTE — TOC Initial Note (Addendum)
Transition of Care [facility]) - Initial/Assessment Note    Patient Details  Name: Kim Burns MRN: [id number] Date of Birth: 03-04-1935  Transition of Care [facility]) CM/SW Contact:    Carles Collet, RN Phone Number: 01/23/2022, 8:00 AM  Clinical Narrative:                  Patient admitted from home. Active w [place] for RN, nephrostomy tube PTA, HD MWF. [place] notified of admission. Patient will need HH orders for continuation of services after DC.  TOC following patient progression through rounds.   Expected Discharge Plan: [place] Barriers to Discharge: Continued Medical Work up   Patient Goals and CMS Choice        Expected Discharge Plan and Services Expected Discharge Plan: [place]   Discharge Planning Services: CM Consult                                 [facility] Agency: [place] Date [place]: 01/23/22 Time HH Agency Contacted: [phone] Representative spoke with at [place]: Claiborne Billings  Prior Living Arrangements/Services                       Activities of Daily Living      Permission Sought/Granted                  Emotional Assessment              Admission diagnosis:  Hyperglycemia [R73.9] Hematoma of left kidney [S37.012A] Hematoma of left kidney, initial encounter [S37.012A] Left-sided thoracic back pain, unspecified chronicity [M54.6] Patient Active Problem List   Diagnosis Date Noted   Hematoma of left kidney 01/22/2022   Kidney, perinephric abscess 11/02/2021   Bacteremia due to Streptococcus 11/01/2021   Lactic acidosis 10/30/2021   Fever 10/30/2021   Severe sepsis ([place]) 10/29/2021   Hydronephrosis of left kidney 10/29/2021   Chronic diastolic CHF (congestive heart failure) ([place]) 10/29/2021   Coronary artery disease involving native coronary artery of native heart without angina pectoris 10/29/2021   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 ([place])  10/29/2021   Chest pain of uncertain etiology    Pulmonary nodule 08/26/2021   Paroxysmal atrial fibrillation ([place]) 08/26/2021   ESRD (end stage renal disease) on dialysis ([place]) 08/25/2021   Renal cell carcinoma of left kidney ([place]) 08/22/2021   Diabetes mellitus type 2, controlled ([place]) 11/14/2016   Hypokalemia 11/12/2016   Melena    Symptomatic anemia    Acute renal failure (ARF) ([place]) 10/03/2016   GERD (gastroesophageal reflux disease) 04/29/2016   Hyperglycemia 04/29/2016   IPMN (intraductal papillary mucinous neoplasm) 04/23/2016   History of stroke 04/16/2016   Sepsis ([place]) 06/18/2013   Localized swelling, mass and lump, neck 03/29/2013   Sinus bradycardia 09/18/2012   Fatigue 09/18/2012   PCP:  [name], MD Pharmacy:   [place], [place] - [address] [place] [address] Phone: [phone] Fax: [phone]  [facility] ([place], [place] [facility] [place] [address] [address] Phone: [phone] Fax: [phone]  [facility] [address] [place] [address] Phone: [phone] Fax: [phone]     Social Determinants of Health (SDOH) Interventions    Readmission Risk Interventions    08/27/2021  10:16 AM  Readmission Risk Prevention Plan  Transportation Screening Complete  PCP or Specialist Appt within 3-5 Days Complete  HRI or [place] Complete  Social Work Consult for [place] Planning/Counseling Complete  Palliative Care Screening Not Applicable  Medication Review [employer]) Complete

## 2022-01-23 NOTE — Telephone Encounter (Signed)
Pt's son is calling in regards to patient who is currently admitted in the hospital. He is very concerned with the fact that they are not including her cardiologist in the decisions that are being made with her medications. He states hospital physicians are wanting to take her off of her off some of the medications that were prescribed to her by Dr. [place] and he would like a call back to discuss this. Please advise.

## 2022-01-23 NOTE — Progress Notes (Signed)
Patient is well-known to me, have been taking care of her for quite some time.  I know her history quite well.  Sorry to see that she has been readmitted with the perinephric hemorrhage.  Have discussed with several of my colleagues, and at this point would strongly consider embolizing the entire kidney if possible to see if we can #1 stop the current bleeding, prevent future bleeding, and dry up the urine leak.  I will follow along, have not been able to get around to see here but will follow and check in with her tomorrow.  Happy to discuss.  [phone]

## 2022-01-23 NOTE — Progress Notes (Signed)
Cardiology Consultation   Patient ID: Kim Burns MRN: [id number]; DOB: August 24, 1935  Admit date: 01/21/2022 Date of Consult: 01/23/2022  PCP:  [name], MD   [place] Providers Cardiologist:  Peter Martinique, MD        Patient Profile:   Kim Burns is a 86 y.o. female with a hx of A-fib with RVR (on Eliquis), diastolic CHF, CKD now ESRD, s/p prior Whipple procedure (04/23/16) with pancreatectomy and partial bowel resection, L sided RCC s/p L renal bland embolization and microwave ablation (08/22/21), ESRD on HD starting 02/2021 (MWF), remote CVA who is being seen 01/23/2022 regarding paroxysmal atrial fibrillation and anticoagulation at the request of patient's family.  History of Present Illness:   Kim Burns is an 86 year old female with above noted medical history who presented to [facility] ED on 12/5 with symptoms of severe back pain, abdominal pain and chest pain.  Patient had just returned home from a trip to [place].  She additionally reports noticing intermittent blood in urine for the past 3 weeks.  In the ED, patient's initial EKG was concerning for new inferior ST changes and cardiology was consulted.  Patient's ECG was reviewed by Dr. Irish Lack and it was felt that patient likely had demand ischemia in the setting of infection. Patient with recent heart catheterization in July of this year which showed widely patent RCA and left circumflex with 50% stenosis of LAD. Medical management recommended.  Patient admitted by medicine service for management of left kidney hematoma, sepsis.  Due to acute blood loss anemia, patient's Eliquis held.  Due to downtrending hemoglobin, patient received 2 units of packed red blood cells as well as Kcentra.  Unfortunately despite transfusion, patient's hemoglobin noted to decrease from 10.1 to 8.4.  Fortunately CTA abdomen was without evidence of active bleeding.  Cardiology reengaged today at the request of patient's  family.     Cardiac history:  Patient followed by Dr. Martinique.  Last seen 11/23/2021.  At the time of this visit, patient was reporting an improvement in orthostatic hypotension that at 1 point had prompted her to be on midodrine on dialysis days.  Patient reported being able to tolerate low-dose Coreg on nondialysis days.  Patient with history of paroxysmal atrial fibrillation and RVR following renal ablation procedure in July of this year. She converted to normal sinus rhythm on amiodarone. This same admission patient also noted with subtle ST elevations in inferior leads and T wave inversions in anterior precordial leads.  She underwent heart catheterization with mild nonobstructive CAD noted. Patient remained in normal sinus rhythm at the time of her follow-up on 10/6.  Given lack of recurrent A-fib, patient's amiodarone was reduced to 100 mg daily.  Continued on Eliquis 2.5 mg twice daily.  Past Medical History:  Diagnosis Date   Anemia of chronic disease    takes iron   Anxiety    Blood transfusion without reported diagnosis    Bradycardia    Diabetes mellitus without complication ([place])    became diabetic after Whipple procedure   Diarrhea    Dysrhythmia 04/16/2016   bradycardia due to medication    ESRD on hemodialysis [facility])    M-W-F   GERD (gastroesophageal reflux disease)    Gout    Headache    History of kidney stones 06/2013   Hyperparathyroidism ([place])    Hypertension    PONV (postoperative nausea and vomiting)    Sleep apnea    no cpap  machine. could not tolerate   Stroke ([place]) 04/16/2016   TIA 1995   Vitamin D deficiency     Past Surgical History:  Procedure Laterality Date   A/V FISTULAGRAM Left 08/30/2021   Procedure: A/V Fistulagram;  Surgeon: Marty Heck, MD;  Location: [place] [facility];  Service: Cardiovascular;  Laterality: Left;   ABDOMINAL HYSTERECTOMY  1985   complete   BACK SURGERY  5537   lower   BASCILIC VEIN TRANSPOSITION Left 04/24/2017    Procedure: LEFT ARM FIRST STAGE BASILIC VEIN TRANSPOSITION;  Surgeon: Serafina Mitchell, MD;  Location: [facility];  Service: Vascular;  Laterality: Left;   [place] Left 07/10/2017   Procedure: SECOND STAGE BASILIC VEIN TRANSPOSITION LEFT ARM;  Surgeon: Serafina Mitchell, MD;  Location: [facility];  Service: Vascular;  Laterality: Left;   BREAST LUMPECTOMY Left x 2   many years apart, benign   CHOLECYSTECTOMY  1985   CYSTOSCOPY W/ URETERAL STENT PLACEMENT Left 10/31/2021   Procedure: CYSTOSCOPY WITH RETROGRADE PYELOGRAM/URETERAL STENT PLACEMENT;  Surgeon: Ardis Hughs, MD;  Location: [place];  Service: Urology;  Laterality: Left;   CYSTOSCOPY WITH URETEROSCOPY AND STENT PLACEMENT Left 06/18/2013   Procedure: CYSTOSCOPY WITH Lef URETEROSCOPY AND Left STENT PLACEMENT;  Surgeon: Dutch Gray, MD;  Location: [facility];  Service: Urology;  Laterality: Left;   ESOPHAGOGASTRODUODENOSCOPY (EGD) WITH PROPOFOL N/A 11/13/2016   Procedure: ESOPHAGOGASTRODUODENOSCOPY (EGD) WITH PROPOFOL;  Surgeon: Otis Brace, MD;  Location: [place];  Service: Gastroenterology;  Laterality: N/A;   EUS N/A 02/07/2016   Procedure: ESOPHAGEAL ENDOSCOPIC ULTRASOUND (EUS) RADIAL;  Surgeon: Arta Silence, MD;  [facility];  Service: Endoscopy;  Laterality: N/A;   EYE SURGERY Bilateral 2014   ioc for catracts    FOOT SURGERY Left 1990   something with toes unsure what    HERNIA REPAIR  2018   IR EMBO TUMOR ORGAN ISCHEMIA INFARCT INC GUIDE ROADMAPPING  08/22/2021   IR RADIOLOGIST EVAL & MGMT  05/18/2021   IR RADIOLOGIST EVAL & MGMT  10/01/2021   IR RADIOLOGIST EVAL & MGMT  11/12/2021   IR RADIOLOGIST EVAL & MGMT  11/22/2021   IR RADIOLOGIST EVAL & MGMT  11/27/2021   IR RADIOLOGIST EVAL & MGMT  12/20/2021   IR RENAL SUPRASEL UNI S&I MOD SED  08/22/2021   IR US GUIDE VASC ACCESS RIGHT  08/22/2021   LEFT HEART CATH AND CORONARY ANGIOGRAPHY N/A 08/27/2021   Procedure: LEFT HEART CATH AND CORONARY  ANGIOGRAPHY;  Surgeon: Burnell Blanks, MD;  Location: [place] [facility];  Service: Cardiovascular;  Laterality: N/A;   PARATHYROID EXPLORATION     PERIPHERAL VASCULAR BALLOON ANGIOPLASTY Left 08/30/2021   Procedure: PERIPHERAL VASCULAR BALLOON ANGIOPLASTY;  Surgeon: Marty Heck, MD;  Location: [place] [facility];  Service: Cardiovascular;  Laterality: Left;  arm fistula   RADIOLOGY WITH ANESTHESIA Left 08/22/2021   Procedure: MICROWAVE ABLATION;  Surgeon: Sandi Mariscal, MD;  Location: [facility];  Service: Anesthesiology;  Laterality: Left;   WHIPPLE PROCEDURE N/A 04/23/2016   Procedure: WHIPPLE PROCEDURE;  Surgeon: Stark Klein, MD;  Location: [place];  Service: General;  Laterality: N/A;     Home Medications:  Prior to Admission medications   Medication Sig Start Date End Date Taking? Authorizing Provider  acetaminophen (TYLENOL 8 HOUR ARTHRITIS PAIN) 650 MG CR tablet Take 1,300 mg by mouth daily as needed for pain.   Yes [provider]  amiodarone (PACERONE) 200 MG tablet Take 0.5 tablets (  100 mg total) by mouth daily. Please keep appointment for further refills 11/23/21  Yes Martinique, Peter M, MD  apixaban (ELIQUIS) 2.5 MG TABS tablet Take 1 tablet (2.5 mg total) by mouth 2 (two) times daily. 09/25/21 03/24/22 Yes Martinique, Peter M, MD  carvedilol (COREG) 3.125 MG tablet Take 1 tablet (3.125 mg total) by mouth 2 (two) times daily with a meal. Take on the days you do not have dialysis Patient taking differently: Take 3.125 mg by mouth 2 (two) times daily with a meal. Take on the days you do not have dialysis( Sundays, Tuesdays, Thursdays, and Saturdays) 11/04/21  Yes Wendee Beavers T, MD  Cholecalciferol (VITAMIN D-3 PO) Take 1 capsule by mouth every Monday, Wednesday, and Friday with hemodialysis.   Yes [provider]  colestipol (COLESTID) 1 g tablet Take 1 g by mouth See admin instructions. 1 tablet (1g) prior to dialysis M-W-F + 1 tablet daily PRN diarrhea   Yes [provider]  diclofenac sodium (VOLTAREN) 1 % GEL Apply 2 g topically 4 (four) times daily as needed (for hand pain).   Yes [provider]  insulin aspart (NOVOLOG FLEXPEN) 100 UNIT/ML FlexPen Inject 1 Units into the skin 3 (three) times daily as needed (CBG > 200).   Yes [provider]  lidocaine-prilocaine (EMLA) cream Apply 1 Application topically every Monday, Wednesday, and Friday with hemodialysis.   Yes [provider]  lipase/protease/amylase (CREON) 36000 UNITS CPEP capsule Take 36,000 Units by mouth 2 (two) times daily with a meal.   Yes [provider]  olopatadine (PATADAY) 0.1 % ophthalmic solution Place 1 drop into both eyes 2 (two) times daily.   Yes [provider]  RESTASIS 0.05 % ophthalmic emulsion 1 drop 2 (two) times daily. 11/06/21  Yes [provider]  rOPINIRole (REQUIP) 0.25 MG tablet Take 0.25 mg by mouth at bedtime. 12/11/21  Yes [provider]  sevelamer carbonate (RENVELA) 800 MG tablet Take 1,600 mg by mouth 3 (three) times daily with meals.   Yes [provider]  sodium chloride flush (NS) 0.9 % SOLN 5 mLs by Intracatheter route every 8 (eight) hours. 11/04/21  Yes Mercy Riding, MD  traMADol (ULTRAM) 50 MG tablet Take 50 mg by mouth every 6 (six) hours as needed for moderate pain or severe pain.   Yes [provider]  XIIDRA 5 % SOLN Place 1 drop into both eyes at bedtime. 09/24/21  Yes [provider]    Inpatient Medications: Scheduled Meds:  (feeding supplement) PROSource Plus  30 mL Oral BID BM   sodium chloride   Intravenous Once   sodium chloride   Intravenous Once   amiodarone  100 mg Oral Daily   cycloSPORINE  1 drop Both Eyes BID   darbepoetin (ARANESP) injection - NON-DIALYSIS  100 mcg Subcutaneous Q Wed-1800   insulin aspart  0-9 Units Subcutaneous Q4H   lipase/protease/amylase  36,000 Units Oral BID WC   metroNIDAZOLE  500 mg Oral Q12H   pantoprazole  40 mg  Oral Daily   rOPINIRole  0.25 mg Oral QHS   senna  1 tablet Oral QHS   sevelamer carbonate  1,600 mg Oral TID WC   Continuous Infusions:  ceFEPime (MAXIPIME) IV 1 g (01/23/22 0208)   PRN Meds: acetaminophen, dextrose, HYDROmorphone (DILAUDID) injection, oxyCODONE, polyethylene glycol, prochlorperazine  Allergies:    Allergies  Allergen Reactions   Lopid [Gemfibrozil] Other (See Comments)    Myalgia    Lotemax [Loteprednol  Etabonate] Rash    Skin burning  Blurry vision   Statins Other (See Comments)    Rosuvastatin Muscle weakness   Norco [Hydrocodone-Acetaminophen] Other (See Comments)    Headaches  Tolerates acetaminophen    Other Diarrhea    Real Butter - diarrhea   Tomato Other (See Comments)    Belching    Vibra-Tab [Doxycycline] Other (See Comments)    Unknown reaction   Amlodipine Other (See Comments)    Norvasc   Codeine Other (See Comments)    headache   Hydrocodone Other (See Comments)   Hydrocodone-Acetaminophen Other (See Comments)    Headache. Tolerates acetaminophen.   Lisinopril Other (See Comments)   Verapamil Other (See Comments)    Social History:   Social History   Socioeconomic History   Marital status: Widowed    Spouse name: Not on file   Number of children: Not on file   Years of education: Not on file   Highest education level: Not on file  Occupational History   Not on file  Tobacco Use   Smoking status: Never   Smokeless tobacco: Never  Vaping Use   Vaping Use: Never used  Substance and Sexual Activity   Alcohol use: No   Drug use: No   Sexual activity: Not on file  Other Topics Concern   Not on file  Social History Narrative   Not on file   Social Determinants of Health   Financial Resource Strain: Not on file  Food Insecurity: No Food Insecurity (11/01/2021)   Hunger Vital Sign    Worried About Running Out of Food in the Last Year: Never true    Ran Out of Food in the Last Year: Never true  Transportation Needs: No  Transportation Needs (11/01/2021)   PRAPARE - Hydrologist (Medical): No    Lack of Transportation (Non-Medical): No  Physical Activity: Not on file  Stress: Not on file  Social Connections: Not on file  Intimate Partner Violence: Not At Risk (11/01/2021)   Humiliation, Afraid, Rape, and Kick questionnaire    Fear of Current or Ex-Partner: No    Emotionally Abused: No    Physically Abused: No    Sexually Abused: No    Family History:    Family History  Problem Relation Age of Onset   Heart disease Mother    Stroke Mother    Cancer Father        colon   Alzheimer's disease Sister    Alzheimer's disease Sister    Cancer Brother        brain   Breast cancer Neg Hx      ROS:  Please see the history of present illness.   All other ROS reviewed and negative.     Physical Exam/Data:   Vitals:   01/22/22 2345 01/23/22 0344 01/23/22 0800 01/23/22 1200  BP: (!) 142/64 (!) 155/74 (!) 153/75 (!) 142/67  Pulse: (!) 104 (!) 103 (!) 101 98  Resp: '18 20 20 20  '$ Temp: 98.3 F (36.8 C) 98.8 F (37.1 C) 98.6 F (37 C) 98 F (36.7 C)  TempSrc: Oral Oral Oral Oral  SpO2: 100% 99%    Weight:      Height:        Intake/Output Summary (Last 24 hours) at 01/23/2022 1242 Last data filed at 01/22/2022 2355 Gross per 24 hour  Intake 730 ml  Output -0.01 ml  Net 730.01 ml      01/21/2022  11:06 PM 11/23/2021    2:09 PM 11/07/2021    3:17 PM  Last 3 Weights  Weight (lbs) 127 lb 13.9 oz 129 lb 9.6 oz 129 lb  Weight (kg) 58 kg 58.786 kg 58.514 kg     Body mass index is 23.39 kg/m.  General:  Well nourished, well developed, in no acute distress HEENT: normal Neck: no JVD Vascular: No carotid bruits; Distal pulses 2+ bilaterally Cardiac:  normal S1, S2; RRR, slighly tachycardic; no murmur  Lungs:  clear to auscultation bilaterally, no wheezing, rhonchi or rales  Abd: soft, nontender, no hepatomegaly  Ext: no edema Musculoskeletal:  No deformities, BUE  and BLE strength normal and equal Skin: warm and dry  Neuro:  CNs 2-12 intact, no focal abnormalities noted Psych:  Normal affect   EKG:  12/5 ECG with sinus rhythm and PACs. Telemetry:  Telemetry was personally reviewed and demonstrates:  sinus rhythm, sinus tachycardia  Relevant CV Studies:  08/27/2021 TTE  IMPRESSIONS     1. Right ventricular systolic function is moderately reduced. The right  ventricular size is mildly enlarged. There is moderately elevated  pulmonary artery systolic pressure. The estimated right ventricular  systolic pressure is 33.2 mmHg. Normal apical  function with free wall hypokinesis, consistent with McConnell's sign as  can be seen in acute PE (image 58). Consider evaluation for PE.   2. Left ventricular ejection fraction, by estimation, is 60 to 65%. The  left ventricle has normal function. The left ventricle has no regional  wall motion abnormalities. Left ventricular diastolic parameters are  consistent with Grade III diastolic  dysfunction (restrictive). Elevated left atrial pressure.   3. Left atrial size was severely dilated.   4. Right atrial size was severely dilated.   5. The mitral valve is degenerative. Mild mitral valve regurgitation. No  evidence of mitral stenosis. Moderate mitral annular calcification.   6. Tricuspid valve regurgitation is mild to moderate.   7. The aortic valve is tricuspid. Aortic valve regurgitation is not  visualized. Aortic valve sclerosis is present, with no evidence of aortic  valve stenosis.   8. The inferior vena cava is dilated in size with <50% respiratory  variability, suggesting right atrial pressure of 15 mmHg.   FINDINGS   Left Ventricle: Left ventricular ejection fraction, by estimation, is 60  to 65%. The left ventricle has normal function. The left ventricle has no  regional wall motion abnormalities. The left ventricular internal cavity  size was normal in size. There is   no left ventricular  hypertrophy. Left ventricular diastolic parameters  are consistent with Grade III diastolic dysfunction (restrictive).  Elevated left atrial pressure.   Right Ventricle: The right ventricular size is mildly enlarged. No  increase in right ventricular wall thickness. Right ventricular systolic  function is moderately reduced. There is moderately elevated pulmonary  artery systolic pressure. The tricuspid  regurgitant velocity is 2.79 m/s, and with an assumed right atrial  pressure of 15 mmHg, the estimated right ventricular systolic pressure is  95.1 mmHg.   Left Atrium: Left atrial size was severely dilated.   Right Atrium: Right atrial size was severely dilated.   Pericardium: There is no evidence of pericardial effusion.   Mitral Valve: The mitral valve is degenerative in appearance. Moderate  mitral annular calcification. Mild mitral valve regurgitation. No evidence  of mitral valve stenosis.   Tricuspid Valve: The tricuspid valve is normal in structure. Tricuspid  valve regurgitation is mild to moderate.   Aortic Valve:  The aortic valve is tricuspid. Aortic valve regurgitation is  not visualized. Aortic valve sclerosis is present, with no evidence of  aortic valve stenosis.   Pulmonic Valve: The pulmonic valve was not well visualized. Pulmonic valve  regurgitation is not visualized.   Aorta: The aortic root and ascending aorta are structurally normal, with  no evidence of dilitation.   Venous: The inferior vena cava is dilated in size with less than 50%  respiratory variability, suggesting right atrial pressure of 15 mmHg.   IAS/Shunts: The interatrial septum was not well visualized.   08/27/2021 LHC    Prox RCA lesion is 20% stenosed.   Mid LM to Dist LM lesion is 20% stenosed.   Ost LAD lesion is 30% stenosed.   Mid Cx lesion is 20% stenosed.   Prox LAD lesion is 50% stenosed.   The left ventricular systolic function is normal.   LV end diastolic pressure is  normal.   The left ventricular ejection fraction is 55-65% by visual estimate.   There is no mitral valve regurgitation.   Mild non-obstructive CAD Normal LV systolic function Elevated troponin and chest pain is likely due to demand ischemic in the setting of rapid atrial fibrillation.    Recommendation: Medical management of CAD.   Laboratory Data:  High Sensitivity Troponin:   Recent Labs  Lab 01/21/22 2305 01/22/22 0110  TROPONINIHS 25* 28*     Chemistry Recent Labs  Lab 01/21/22 2305 01/22/22 0017 01/22/22 0506 01/22/22 1257 01/23/22 0422  NA 136 136 138 138 137  K 4.3 4.3 5.4* 4.8 3.7  CL 104 105 105  --  96*  CO2 19*  --  20*  --  26  GLUCOSE 378* 425* 379*  --  129*  BUN 59* 58* 62*  --  38*  CREATININE 6.46* 7.00* 6.70*  --  4.70*  CALCIUM 8.7*  --  9.0  --  9.0  MG  --   --  2.8*  --   --   GFRNONAA 6*  --  6*  --  9*  ANIONGAP 13  --  13  --  15    Recent Labs  Lab 01/21/22 2305 01/22/22 0506  PROT 5.6* 5.2*  ALBUMIN 2.4* 2.3*  AST 37 28  ALT 52* 47*  ALKPHOS 55 45  BILITOT 0.4 0.4   Lipids No results for input(s): "CHOL", "TRIG", "HDL", "LABVLDL", "LDLCALC", "CHOLHDL" in the last 168 hours.  Hematology Recent Labs  Lab 01/21/22 2305 01/22/22 0017 01/22/22 0506 01/22/22 1243 01/22/22 1257 01/22/22 1746 01/23/22 0422  WBC 23.6*  --  20.1*  --   --   --  11.8*  RBC 3.23*  --  2.72*  --   --   --  2.93*  HGB 9.0*   < > 7.5*   < > 7.5* 10.1* 8.4*  HCT 29.5*   < > 25.5*   < > 22.0* 30.8* 25.4*  MCV 91.3  --  93.8  --   --   --  86.7  MCH 27.9  --  27.6  --   --   --  28.7  MCHC 30.5  --  29.4*  --   --   --  33.1  RDW 18.8*  --  19.0*  --   --   --  17.2*  PLT 325  --  294  --   --   --  179   < > = values in this interval not displayed.  Thyroid No results for input(s): "TSH", "FREET4" in the last 168 hours.  BNPNo results for input(s): "BNP", "PROBNP" in the last 168 hours.  DDimer No results for input(s): "DDIMER" in the last 168  hours.   Radiology/Studies:  CT Angio Abd/Pel w/ and/or w/o  Result Date: 01/23/2022 CLINICAL DATA:  Acute left-sided subcapsular renal and perinephric hemorrhage. EXAM: CT ANGIOGRAPHY ABDOMEN AND PELVIS WITH CONTRAST AND WITHOUT CONTRAST TECHNIQUE: Multidetector CT imaging of the abdomen and pelvis was performed using the standard protocol during bolus administration of intravenous contrast. Multiplanar reconstructed images and MIPs were obtained and reviewed to evaluate the vascular anatomy. RADIATION DOSE REDUCTION: This exam was performed according to the departmental dose-optimization program which includes automated exposure control, adjustment of the mA and/or kV according to patient size and/or use of iterative reconstruction technique. CONTRAST:  58m OMNIPAQUE IOHEXOL 350 MG/ML SOLN COMPARISON:  Non-contrast CT of the abdomen and pelvis on 01/22/2022 FINDINGS: VASCULAR Aorta: Atherosclerosis of the abdominal aorta without evidence of aneurysm or dissection. Celiac: Normally patent. Normally patent branch vessels and branch vessel anatomy. SMA: Normally patent. Renals: Calcified plaque at origins of bilateral single renal arteries without significant stenosis. IMA: Normally patent. Inflow: Normally patent bilateral iliac arteries without aneurysm. Proximal Outflow: Normally patent bilateral common femoral arteries and femoral bifurcations. Closure device ring on top of the right common femoral artery without mass effect or pseudoaneurysm. Veins: No venous abnormalities on venous phase imaging. Review of the MIP images confirms the above findings. NON-VASCULAR Lower chest: Small left pleural effusion and mild left basilar atelectasis. Hepatobiliary: No focal liver abnormality is seen. Status post cholecystectomy. No biliary dilatation. Pancreas: Status post Whipple procedure and pancreatectomy. Spleen: Normal in size without focal abnormality. Adrenals/Urinary Tract: Stable left posterior subcapsular  and perinephric hematoma measuring approximately 9 cm in greatest diameter. This is stable when measuring at the same level on the prior unenhanced CT. No evidence of active bleeding on arterial or venous phases of imaging. No arterial pseudoaneurysm identified. Underlying left kidney demonstrates atrophy and some degree of underlying hydronephrosis. A ureteral stent is present extending from the collecting system into the bladder. Additional percutaneous drainage catheter remains present just inferior to the lower pole collecting system without fluid collection. Stomach/Bowel: No evidence of bowel obstruction, ileus or free intraperitoneal air. Lymphatic: No enlarged abdominal or pelvic lymph nodes. Reproductive: Status post hysterectomy. No adnexal masses. Other: No abdominal wall hernia or abnormality. No abdominopelvic ascites. Musculoskeletal: Stable degenerative disc disease of the lumbar spine. Stable osteopenia. No bony lesions or fractures. IMPRESSION: 1. Stable left posterior subcapsular and perinephric hematoma measuring approximately 9 cm in greatest diameter. No evidence of active bleeding on arterial or venous phases of imaging. No arterial pseudoaneurysm identified. 2. Underlying left renal atrophy and some degree of underlying hydronephrosis. A ureteral stent is present extending from the collecting system into the bladder. Additional percutaneous drainage catheter remains present just inferior to the lower pole collecting system. 3. Small left pleural effusion and mild left basilar atelectasis. 4. Aortic atherosclerosis. Aortic Atherosclerosis (ICD10-I70.0). Electronically Signed   By: GAletta EdouardM.D.   On: 01/23/2022 12:29   CT Renal Stone Study  Result Date: 01/22/2022 CLINICAL DATA:  Abdominal flank pain. EXAM: CT ABDOMEN AND PELVIS WITHOUT CONTRAST TECHNIQUE: Multidetector CT imaging of the abdomen and pelvis was performed following the standard protocol without IV contrast. RADIATION  DOSE REDUCTION: This exam was performed according to the departmental dose-optimization program which includes automated exposure control, adjustment of the mA and/or  kV according to patient size and/or use of iterative reconstruction technique. COMPARISON:  CT abdomen pelvis dated 11/12/2021. FINDINGS: Evaluation of this exam is limited in the absence of intravenous contrast. Lower chest: Partially visualized small left pleural effusion. There is coronary vascular calcification. No intra-abdominal free air or free fluid. Hepatobiliary: The liver is unremarkable. There is mild biliary dilatation, post cholecystectomy. Pancreas: The pancreas is atrophic and poorly visualized. Spleen: The spleen is unremarkable. Adrenals/Urinary Tract: The right adrenal glands unremarkable. The left adrenal gland is not visualized. There has been interval development of a large left renal subcapsular hematoma extending into the perirenal space. The hematoma measures approximately 6.2 x 8.9 cm in greatest axial dimensions and 14 cm in craniocaudal length. There is mass effect and compression of the left renal parenchyma. Please note evaluation for possible active bleed is limited on this noncontrast CT. Similar positioning of the percutaneous drainage catheter and left pigtail ureteral stent. No hydronephrosis. Moderate right renal atrophy with several cysts and additional smaller hypodense lesions which are not characterized. There is no hydronephrosis on the right. The right ureter and urinary bladder appear unremarkable. Stomach/Bowel: Postsurgical changes of the distal stomach. The stomach is distended. High attenuating focus within the distal stomach likely ingested content. There is no bowel obstruction. The appendix is not visualized with certainty. No inflammatory changes identified in the right lower quadrant. Vascular/Lymphatic: Advanced aortoiliac atherosclerotic disease. The IVC is unremarkable. No portal venous gas. No  obvious adenopathy. Reproductive: Hysterectomy. Other: None Musculoskeletal: Osteopenia with degenerative changes of the spine. No acute osseous pathology. IMPRESSION: 1. Large left renal subcapsular hematoma extending into the perirenal space. 2. Similar positioning of the percutaneous drainage catheter and left pigtail ureteral stent. No hydronephrosis. 3. No bowel obstruction. 4. Partially visualized small left pleural effusion. 5.  Aortic Atherosclerosis (ICD10-I70.0). These results were called by telephone at the time of interpretation on 01/22/2022 at 12:59 am to provider [facility] , who verbally acknowledged these results. Electronically Signed   By: Anner Crete M.D.   On: 01/22/2022 01:05   DG Chest Port 1 View  Result Date: 01/22/2022 CLINICAL DATA:  Possible sepsis. EXAM: PORTABLE CHEST 1 VIEW COMPARISON:  10/29/2021. FINDINGS: The heart is enlarged and the mediastinal contour is within normal limits. Atherosclerotic calcification of the aorta is noted. Lung volumes are low with atelectasis at the lung bases. No definite effusion or pneumothorax. Surgical clips are noted in the cervical soft tissues on the right. No acute osseous abnormality. IMPRESSION: 1. Low lung volumes with atelectasis at the lung bases. 2. Cardiomegaly. Electronically Signed   By: Brett Fairy M.D.   On: 01/22/2022 00:30     Assessment and Plan:   Paroxysmal atrial fibrillation Secondary hypercoagulable state  Patient with paroxysmal atrial fibrillation and RVR first noted in July of this year.  Has remained in sinus rhythm since then on amiodarone (recently reduced to 100 mg daily).  Patient also takes 2.'5mg'$  of Eliquis daily.  Currently admitted to medicine service with left renal subcapsular hematoma, acute anemia, suspected sepsis.  Patient currently off of Eliquis due to acute anemia. CHA2DS2-VASc Score = 7 and with history of CVA, patient is relatively high risk.  It looks like patient's neurological  history/CVA imaging shows primarily vascular/htn related changes and not any acute event.  In the current situation, completely reasonable to continue holding Eliquis. Appears that there is currently a consideration of left kidney embolization but no definitive plans. Could possibly resume Eliquis if she had embolization  and proved to be stable without recurrent bleeding? At patient's request, I called and spoke with her son [name] and attempted to clarify his questions about her care. Continue amiodarone 100 mg daily   Chronic Diastolic HF  TTE from 0/93/23 with LVEF 60-65%, moderate reduction of RV systolic function (RV pressure 46.1 mmHg), moderately elevated pulmonary artery pressure, grade III diastolic dysfunction.  Patient appears euvolemic on exam.  Continue to monitor volume status closely and manage with dialysis.   Hypertension  Home Coreg regimen of 3.125 mg twice daily on nondialysis days.  Okay to resume this as long as patient maintains adequate BP.   Left renal subcapsular hematoma Acute anemia  Hemoglobin as low as 6.9 this admission.  Patient status post 2 units PRBCs initially had hemoglobin improved to 10.1.  Unfortunately this decreased to 8.1 today.  CTA abdomen/pelvis without evidence of acute active bleed.  Continue to avoid anticoagulation at this time.  Management per primary/urology/nephrology   Management per primary team:  DM type II ESRD   Risk Assessment/Risk Scores:          CHA2DS2-VASc Score = 7   This indicates a 11.2% annual risk of stroke. The patient's score is based upon: CHF History: 0 HTN History: 0 Diabetes History: 1 Stroke History: 2 Vascular Disease History: 1 Age Score: 2 Gender Score: 1         For questions or updates, please contact [place] Please consult [email or web address] for contact info under    Signed, Lily Kocher, PA-C  01/23/2022 12:42 PM

## 2022-01-23 NOTE — Progress Notes (Addendum)
Pt receives out-pt HD at [facility] on MWF. Pt arrives at 6:20 am for 6:40 am chair time. Will assist as needed.   Melven Sartorius Renal Navigator [phone]

## 2022-01-23 NOTE — Evaluation (Signed)
Physical Therapy Evaluation Patient Details Name: Kim Burns MRN: [id number] DOB: 1935-04-08 Today's Date: 01/23/2022  History of Present Illness  Patient is an 86 year old female presents with severe back pain found to have subcapsular hematoma. See was having intermittent hematuria x3 wk,returned from a trip from [place] 12/4 and started having severe diffuse back pain during night so came to the ED.PMH significant for renal cell mass status post nephrostomy tube, ureteral stent, ESRD on HD MWF PAF on Eliquis, chronic diastolic CHF nonobstructive CAD, previous CVA.   Clinical Impression  Kim Burns is 86 y.o. female admitted with above HPI and diagnosis. Patient is currently limited by functional impairments below (see PT problem list). Patient alone and has 24/7 care set up by family and typically mobilizes with RW in home at baseline. Overall pt mobilizing with min assist for transfers and gait with RW. Pt able to ambulated ~150' with RW and no rest breaks requires, VSS throughout and no overt LOB. Patient will benefit from continued skilled PT interventions to address impairments and progress independence with mobility, recommending HHPT with ongoing [place] services and 24/7 assist from family. Acute PT will follow and progress as able.        Recommendations for follow up therapy are one component of a multi-disciplinary discharge planning process, led by the attending physician.  Recommendations may be updated based on patient status, additional functional criteria and insurance authorization.  Follow Up Recommendations Home health PT      Assistance Recommended at Discharge Frequent or constant Supervision/Assistance  Patient can return home with the following  A little help with walking and/or transfers;A little help with bathing/dressing/bathroom;Assistance with cooking/housework;Direct supervision/assist for medications management;Assist for transportation;Help with stairs or  ramp for entrance    Equipment Recommendations None recommended by PT  Recommendations for Other Services       Functional Status Assessment Patient has had a recent decline in their functional status and demonstrates the ability to make significant improvements in function in a reasonable and predictable amount of time.     Precautions / Restrictions Precautions Precautions: Fall Restrictions Weight Bearing Restrictions: No      Mobility  Bed Mobility Overal bed mobility: Needs Assistance Bed Mobility: Supine to Sit     Supine to sit: Min assist, HOB elevated      cues to use bed rail, pt reaching for therapist and min assist to scoot hips to EOB and raise trunk provided.     Transfers Overall transfer level: Needs assistance Equipment used: Rolling walker (2 wheels) Transfers: Sit to/from Stand Sit to Stand: Min assist          Cues for hand placement to power up from EOB, min assist to fully rise and steady with RW.       Ambulation/Gait Ambulation/Gait assistance: Min assist Gait Distance (Feet): 150 Feet Assistive device: Rolling walker (2 wheels) Gait Pattern/deviations: Step-through pattern, Decreased stride length, Shuffle, Trunk flexed Gait velocity: decr  Cues for management of RW, pt tends to drift behind walker, proximity improved throughout with min assist to steady and guide turns.       Stairs            Wheelchair Mobility    Modified Rankin (Stroke Patients Only)       Balance Overall balance assessment: Needs assistance Sitting-balance support: Feet supported Sitting balance-Leahy Scale: Fair     Standing balance support: During functional activity, Bilateral upper extremity supported, Reliant on assistive device for balance Standing  balance-Leahy Scale: Poor                               Pertinent Vitals/Pain Pain Assessment Pain Assessment: 0-10 Pain Score: 6  Pain Location: headache due to no  glasses Pain Descriptors / Indicators: Headache Pain Intervention(s): Limited activity within patient's tolerance, Monitored during session, Repositioned    Home Living Family/patient expects to be discharged to:: Private residence Living Arrangements: Alone Available Help at Discharge: Family;Available 24 hours/day Type of Home: House Home Access: Stairs to enter Entrance Stairs-Rails: Left Entrance Stairs-Number of Steps: 13 Alternate Level Stairs-Number of Steps: 13 Home Layout: Two level;Able to live on main level with bedroom/bathroom Home Equipment: Rolling Walker (2 wheels);Cane - single point;Shower seat Additional Comments: family providing 24 hr assist since surgery in July    Prior Function Prior Level of Function : Independent/Modified Independent             Mobility Comments: furniture walks in her home, uses RW outside of her home ADLs Comments: independent in self care, light meal prep and light houskeeping     Hand Dominance   Dominant Hand: Right    Extremity/Trunk Assessment   Upper Extremity Assessment Upper Extremity Assessment: Defer to OT evaluation    Lower Extremity Assessment Lower Extremity Assessment: Generalized weakness    Cervical / Trunk Assessment Cervical / Trunk Assessment: Kyphotic  Communication   Communication: No difficulties  Cognition Arousal/Alertness: Awake/alert Behavior During Therapy: WFL for tasks assessed/performed Overall Cognitive Status: Within Functional Limits for tasks assessed                                          General Comments General comments (skin integrity, edema, etc.): HD site from yesterday bleeding at end of session, RN notified and applied pressure dressing to Lt upper arm.    Exercises     Assessment/Plan    PT Assessment Patient needs continued PT services  PT Problem List Decreased strength;Decreased activity tolerance;Decreased balance;Decreased mobility;Decreased  knowledge of use of DME;Decreased knowledge of precautions       PT Treatment Interventions DME instruction;Gait training;Stair training;Functional mobility training;Therapeutic activities;Therapeutic exercise;Balance training;Patient/family education    PT Goals (Current goals can be found in the Care Plan section)  Acute Rehab PT Goals Patient Stated Goal: get stronger and home PT Goal Formulation: With patient Time For Goal Achievement: 02/06/22 Potential to Achieve Goals: Good    Frequency Min 3X/week     Co-evaluation               AM-PAC PT "6 Clicks" Mobility  Outcome Measure Help needed turning from your back to your side while in a flat bed without using bedrails?: A Little Help needed moving from lying on your back to sitting on the side of a flat bed without using bedrails?: A Little Help needed moving to and from a bed to a chair (including a wheelchair)?: A Little Help needed standing up from a chair using your arms (e.g., wheelchair or bedside chair)?: A Little Help needed to walk in hospital room?: A Little Help needed climbing 3-5 steps with a railing? : A Lot 6 Click Score: 17    End of Session Equipment Utilized During Treatment: Gait belt Activity Tolerance: Patient tolerated treatment well Patient left: in chair;with call bell/phone within reach Nurse Communication: Mobility  status PT Visit Diagnosis: Muscle weakness (generalized) (M62.81);Difficulty in walking, not elsewhere classified (R26.2);Unsteadiness on feet (R26.81)    Time: 1188-6773 PT Time Calculation (min) (ACUTE ONLY): 35 min   Charges:   PT Evaluation $PT Eval Moderate Complexity: 1 Mod PT Treatments $Gait Training: 8-22 mins        Verner Mould, DPT Acute Rehabilitation Services Office [phone]  01/23/22 10:05 AM

## 2022-01-23 NOTE — Evaluation (Signed)
Occupational Therapy Evaluation Patient Details Name: Kim Burns MRN: [id number] DOB: 12/29/1935 Today's Date: 01/23/2022   History of Present Illness Patient is an 86 year old female presents with severe back pain found to have subcapsular hematoma. See was having intermittent hematuria x3 wk,returned from a trip from [place] 12/4 and started having severe diffuse back pain during night so came to the ED.PMH significant for renal cell mass status post nephrostomy tube, ureteral stent, ESRD on HD MWF PAF on Eliquis, chronic diastolic CHF nonobstructive CAD, previous CVA.   Clinical Impression   Pt has family living with her since July 2023 that can assist, does not use AD for mobility, and is generally independent with ADLs. Pt currently needing min A for ADLs and mobility with RW. Pt with 1/4 DOE after short distance ambulation in room. Pt presenting with impairments listed below, will follow acutely. Recommend HHOT at d/c.      Recommendations for follow up therapy are one component of a multi-disciplinary discharge planning process, led by the attending physician.  Recommendations may be updated based on patient status, additional functional criteria and insurance authorization.   Follow Up Recommendations  Home health OT     Assistance Recommended at Discharge Intermittent Supervision/Assistance  Patient can return home with the following A little help with walking and/or transfers;A little help with bathing/dressing/bathroom;Assistance with cooking/housework;Help with stairs or ramp for entrance    Functional Status Assessment  Patient has had a recent decline in their functional status and demonstrates the ability to make significant improvements in function in a reasonable and predictable amount of time.  Equipment Recommendations  None recommended by OT    Recommendations for Other Services PT consult     Precautions / Restrictions Precautions Precautions:  Fall Restrictions Weight Bearing Restrictions: No      Mobility Bed Mobility               General bed mobility comments: OOB in chair upon arrival    Transfers Overall transfer level: Needs assistance Equipment used: Rolling walker (2 wheels) Transfers: Sit to/from Stand Sit to Stand: Min assist           General transfer comment: min A from low (toilet) surface      Balance Overall balance assessment: Needs assistance Sitting-balance support: Feet supported Sitting balance-Leahy Scale: Fair     Standing balance support: During functional activity, Bilateral upper extremity supported, Reliant on assistive device for balance Standing balance-Leahy Scale: Poor                             ADL either performed or assessed with clinical judgement   ADL Overall ADL's : Needs assistance/impaired Eating/Feeding: NPO   Grooming: Wash/dry hands;Set up;Standing   Upper Body Bathing: Sitting;Minimal assistance   Lower Body Bathing: Minimal assistance;Sitting/lateral leans   Upper Body Dressing : Minimal assistance;Sitting   Lower Body Dressing: Minimal assistance;Sitting/lateral leans   Toilet Transfer: Minimal assistance;Rolling walker (2 wheels);Ambulation;Regular Toilet   Toileting- Clothing Manipulation and Hygiene: Minimal assistance       Functional mobility during ADLs: Minimal assistance       Vision Baseline Vision/History: 1 Wears glasses Vision Assessment?: No apparent visual deficits     Perception Perception Perception Tested?: No   Praxis Praxis Praxis tested?: Not tested    Pertinent Vitals/Pain Pain Assessment Pain Assessment: Faces Pain Score: 6  Faces Pain Scale: Hurts even more Pain Location: headache due to no glasses Pain  Descriptors / Indicators: Headache Pain Intervention(s): Limited activity within patient's tolerance, Monitored during session, Repositioned     Hand Dominance Right   Extremity/Trunk  Assessment Upper Extremity Assessment Upper Extremity Assessment: Generalized weakness   Lower Extremity Assessment Lower Extremity Assessment: Defer to PT evaluation   Cervical / Trunk Assessment Cervical / Trunk Assessment: Normal   Communication Communication Communication: No difficulties   Cognition Arousal/Alertness: Awake/alert Behavior During Therapy: WFL for tasks assessed/performed Overall Cognitive Status: Within Functional Limits for tasks assessed                                       General Comments  VSS on RA    Exercises     Shoulder Instructions      Home Living Family/patient expects to be discharged to:: Private residence Living Arrangements: Alone Available Help at Discharge: Family;Available 24 hours/day Type of Home: House Home Access: Stairs to enter [employer] of Steps: 13 Entrance Stairs-Rails: Left Home Layout: Two level;Able to live on main level with bedroom/bathroom Alternate Level Stairs-Number of Steps: 13 Alternate Level Stairs-Rails: Left Bathroom Shower/Tub: Tub/shower unit;Walk-in shower;Sponge bathes at baseline   Bathroom Toilet: Handicapped height Bathroom Accessibility: Yes   Home Equipment: Conservation officer, nature (2 wheels);Cane - single point;Shower seat   Additional Comments: family providing 24 hr assist since surgery in July      Prior Functioning/Environment Prior Level of Function : Independent/Modified Independent             Mobility Comments: furniture walks in her home, uses RW outside of her home ADLs Comments: independent in self care, light meal prep and light houskeeping        OT Problem List: Decreased strength;Decreased range of motion;Decreased activity tolerance;Impaired balance (sitting and/or standing)      OT Treatment/Interventions: Self-care/ADL training;Therapeutic exercise;Energy conservation;DME and/or AE instruction;Therapeutic activities;Patient/family  education;Balance training    OT Goals(Current goals can be found in the care plan section) Acute Rehab OT Goals Patient Stated Goal: none stated OT Goal Formulation: With patient Time For Goal Achievement: 02/06/22 Potential to Achieve Goals: Good ADL Goals Pt Will Perform Upper Body Dressing: with modified independence;sitting Pt Will Perform Lower Body Dressing: with modified independence;sitting/lateral leans;sit to/from stand Pt Will Transfer to Toilet: with modified independence;ambulating;regular height toilet Pt Will Perform Tub/Shower Transfer: Tub transfer;Shower transfer;with modified independence;ambulating;rolling walker;shower seat  OT Frequency: Min 2X/week    Co-evaluation              AM-PAC OT "6 Clicks" Daily Activity     Outcome Measure Help from another person eating meals?: None Help from another person taking care of personal grooming?: A Little Help from another person toileting, which includes using toliet, bedpan, or urinal?: A Little Help from another person bathing (including washing, rinsing, drying)?: A Lot Help from another person to put on and taking off regular upper body clothing?: A Little Help from another person to put on and taking off regular lower body clothing?: A Little 6 Click Score: 18   End of Session Equipment Utilized During Treatment: Rolling walker (2 wheels) Nurse Communication: Mobility status  Activity Tolerance: Patient tolerated treatment well Patient left: in chair;with call bell/phone within reach;with chair alarm set  OT Visit Diagnosis: Unsteadiness on feet (R26.81);Other abnormalities of gait and mobility (R26.89);Muscle weakness (generalized) (M62.81)                Time: 5956-3875  OT Time Calculation (min): 23 min Charges:  OT General Charges $OT Visit: 1 Visit OT Evaluation $OT Eval Moderate Complexity: 1 Mod OT Treatments $Self Care/Home Management : 8-22 mins  Renaye Rakers, OTD, OTR/L SecureChat  Preferred Acute Rehab [phone]  Arianna Delsanto K Koonce 01/23/2022, 10:14 AM

## 2022-01-23 NOTE — Progress Notes (Signed)
NAME:  Kim Burns, MRN:  [id number], DOB:  Jun 09, 1935, LOS: 1 ADMISSION DATE:  01/21/2022, CONSULTATION DATE:  01/23/22 REFERRING MD:  Antonieta Pert MD, CHIEF COMPLAINT:  Sepsis and renal hematoma   History of Present Illness:  86 year old female with history of ESRD on HD MWF, A-fib on Eliquis, diastolic CHF, nonobstructive CAD, left renal cell carcinoma status post left nephrostomy tube, who presents with severe left flank pain that started after dinner night of 01/21/2022.  Pain is described as sharp and radiates to front of abdomen and under left breast.  It is worse with movement.  Patient also describes some bloody output from left nephrostomy tube that has been ongoing for couple of weeks.   Hospial course notable for lactate of approximately 6, hemoglobin of 7, and a CT scan showing a large left subcapsular renal hematoma extending into perirenal space.  Also with WBCs approximately 20 and hypothermia.  Cefepime and Flagyl started.  Pertinent  Medical History   Past Medical History:  Diagnosis Date   Anemia of chronic disease    takes iron   Anxiety    Blood transfusion without reported diagnosis    Bradycardia    Diabetes mellitus without complication ([place])    became diabetic after Whipple procedure   Diarrhea    Dysrhythmia 04/16/2016   bradycardia due to medication    ESRD on hemodialysis [facility])    M-W-F   GERD (gastroesophageal reflux disease)    Gout    Headache    History of kidney stones 06/2013   Hyperparathyroidism ([place])    Hypertension    PONV (postoperative nausea and vomiting)    Sleep apnea    no cpap machine. could not tolerate   Stroke ([place]) 04/16/2016   TIA 1995   Vitamin D deficiency      Significant Hospital Events: Including procedures, antibiotic start and stop dates in addition to other pertinent events   12/5>current flagyl 12/4>current cefepime  Objective   Blood pressure (!) 153/75, pulse (!) 101, temperature 98.6 F (37 C), temperature  source Oral, resp. rate 20, height '5\' 2"'$  (1.575 m), weight 58 kg, SpO2 99 %.        Intake/Output Summary (Last 24 hours) at 01/23/2022 0840 Last data filed at 01/22/2022 2355 Gross per 24 hour  Intake 1230 ml  Output -0.01 ml  Net 1230.01 ml    Filed Weights   01/21/22 2306  Weight: 58 kg    Examination: General: Awake alert much more interactive today HEENT: MM pink/moist no JVD or lymphadenopathy Neuro: Grossly intact without focal defect CV: Heart sounds are regular with a ventricular rate of 110 PULM: Decreased breath sounds in the bases  GI: soft, bsx4 active .  Left flank tenderness Extremities: warm/dry, 1+ edema  Skin: no rashes or lesions   Labs: Recent Labs  Lab 01/21/22 2305 01/22/22 0017 01/22/22 0506 01/22/22 1257 01/23/22 0422  NA 136 136 138 138 137  K 4.3 4.3 5.4* 4.8 3.7  CL 104 105 105  --  96*  CO2 19*  --  20*  --  26  BUN 59* 58* 62*  --  38*  CREATININE 6.46* 7.00* 6.70*  --  4.70*  GLUCOSE 378* 425* 379*  --  129*   Recent Labs  Lab 01/21/22 2305 01/22/22 0017 01/22/22 0506 01/22/22 1243 01/22/22 1257 01/22/22 1746 01/23/22 0422  HGB 9.0*   < > 7.5*   < > 7.5* 10.1* 8.4*  HCT 29.5*   < >  25.5*   < > 22.0* 30.8* 25.4*  WBC 23.6*  --  20.1*  --   --   --  11.8*  PLT 325  --  294  --   --   --  179   < > = values in this interval not displayed.    01/22/2022 1:11 AM sinus rhythm, no evidence of ST elevation or depression  Imaging: CT Renal Stone Study  Result Date: 01/22/2022 CLINICAL DATA:  Abdominal flank pain. EXAM: CT ABDOMEN AND PELVIS WITHOUT CONTRAST TECHNIQUE: Multidetector CT imaging of the abdomen and pelvis was performed following the standard protocol without IV contrast. RADIATION DOSE REDUCTION: This exam was performed according to the departmental dose-optimization program which includes automated exposure control, adjustment of the mA and/or kV according to patient size and/or use of iterative reconstruction technique.  COMPARISON:  CT abdomen pelvis dated 11/12/2021. FINDINGS: Evaluation of this exam is limited in the absence of intravenous contrast. Lower chest: Partially visualized small left pleural effusion. There is coronary vascular calcification. No intra-abdominal free air or free fluid. Hepatobiliary: The liver is unremarkable. There is mild biliary dilatation, post cholecystectomy. Pancreas: The pancreas is atrophic and poorly visualized. Spleen: The spleen is unremarkable. Adrenals/Urinary Tract: The right adrenal glands unremarkable. The left adrenal gland is not visualized. There has been interval development of a large left renal subcapsular hematoma extending into the perirenal space. The hematoma measures approximately 6.2 x 8.9 cm in greatest axial dimensions and 14 cm in craniocaudal length. There is mass effect and compression of the left renal parenchyma. Please note evaluation for possible active bleed is limited on this noncontrast CT. Similar positioning of the percutaneous drainage catheter and left pigtail ureteral stent. No hydronephrosis. Moderate right renal atrophy with several cysts and additional smaller hypodense lesions which are not characterized. There is no hydronephrosis on the right. The right ureter and urinary bladder appear unremarkable. Stomach/Bowel: Postsurgical changes of the distal stomach. The stomach is distended. High attenuating focus within the distal stomach likely ingested content. There is no bowel obstruction. The appendix is not visualized with certainty. No inflammatory changes identified in the right lower quadrant. Vascular/Lymphatic: Advanced aortoiliac atherosclerotic disease. The IVC is unremarkable. No portal venous gas. No obvious adenopathy. Reproductive: Hysterectomy. Other: None Musculoskeletal: Osteopenia with degenerative changes of the spine. No acute osseous pathology. IMPRESSION: 1. Large left renal subcapsular hematoma extending into the perirenal space. 2.  Similar positioning of the percutaneous drainage catheter and left pigtail ureteral stent. No hydronephrosis. 3. No bowel obstruction. 4. Partially visualized small left pleural effusion. 5.  Aortic Atherosclerosis (ICD10-I70.0). These results were called by telephone at the time of interpretation on 01/22/2022 at 12:59 am to provider [facility] , who verbally acknowledged these results. Electronically Signed   By: Anner Crete M.D.   On: 01/22/2022 01:05   DG Chest Port 1 View  Result Date: 01/22/2022 CLINICAL DATA:  Possible sepsis. EXAM: PORTABLE CHEST 1 VIEW COMPARISON:  10/29/2021. FINDINGS: The heart is enlarged and the mediastinal contour is within normal limits. Atherosclerotic calcification of the aorta is noted. Lung volumes are low with atelectasis at the lung bases. No definite effusion or pneumothorax. Surgical clips are noted in the cervical soft tissues on the right. No acute osseous abnormality. IMPRESSION: 1. Low lung volumes with atelectasis at the lung bases. 2. Cardiomegaly. Electronically Signed   By: Brett Fairy M.D.   On: 01/22/2022 00:30     Resolved Hospital Problem list   Resolved Problems:   *  No resolved hospital problems. *   Assessment & Plan:  Principal Problem:   Hematoma of left kidney  Lactic acidosis resolved Demand cardiac ischemia Hemodynamically stable. Appears dry on exam. Elevated lactate. She's compensating at this time but I'm concerned her condition could progress to overt shock. In setting of renal hematoma, could be hypovolemic due to hemorrhagic. Also could be septic with elevated WBC and hypothermia with L nephrostomy as potential nidus of infection. Hopeful that lactic acidosis will resolve with adequate resuscitation. Will follow closely. Status post fluid resuscitation Lactic acidosis resolved Agree with antimicrobial therapy  L renal subcapsular hematoma extending into perirenal space Acute on chronic anemia Source of flank and  abdominal pain. Concern for hemorrhagic shock and acute blood loss anemia. Recent Labs    01/22/22 1746 01/23/22 0422  HGB 10.1* 8.4*   Monitor hemoglobin Transfuse per protocol Interventional radiology is following  ESRD Per nephrology  Type 2 diabetes complicated by hyperglycemia CBG (last 3)  Recent Labs    01/23/22 0022 01/23/22 0420 01/23/22 0806  GLUCAP 111* 120* 147*    Per primary Currently she is hemodynamically stable pulmonary critical care will sign off at this time please call if needed.   Richardson Landry Mikale Silversmith ACNP Acute Care Nurse Practitioner [place] Please consult Amion 01/23/2022, 8:46 AM

## 2022-01-23 NOTE — Telephone Encounter (Signed)
Just sending to you as an FYI.  Thanks!

## 2022-01-23 NOTE — Progress Notes (Signed)
[facility] Progress Note   Subjective:  Seen in her room - she is sitting up in recliner, her coloring looks much better. Still with abd/flank pain, but much improved from admit. Hgb up to 10.1 overnight s/p 2U PRBCs - falling back to 8.4 this AM. Urology and IR are both involved - looks like plan for CTA abdomen now and consideration to embolize the entire L kidney. WBC and lactic acid both improved today.  Objective Vitals:   01/22/22 2305 01/22/22 2345 01/23/22 0344 01/23/22 0800  BP:  (!) 142/64 (!) 155/74 (!) 153/75  Pulse:  (!) 104 (!) 103 (!) 101  Resp:  '18 20 20  '$ Temp: 98 F (36.7 C) 98.3 F (36.8 C) 98.8 F (37.1 C) 98.6 F (37 C)  TempSrc: Oral Oral Oral Oral  SpO2:  100% 99%   Weight:      Height:       Physical Exam General: Frail woman, NAD. [place] O2 in place Heart: RRR; 2/6 murmur Lungs: CTAB Abdomen: soft, L flank JP drain with cloudy yellow-brown output Extremities: No LE edema Dialysis Access: LUE AVF + bruit  Additional Objective Labs: Basic Metabolic Panel: Recent Labs  Lab 01/21/22 2305 01/22/22 0017 01/22/22 0506 01/22/22 1257 01/23/22 0422  NA 136 136 138 138 137  K 4.3 4.3 5.4* 4.8 3.7  CL 104 105 105  --  96*  CO2 19*  --  20*  --  26  GLUCOSE 378* 425* 379*  --  129*  BUN 59* 58* 62*  --  38*  CREATININE 6.46* 7.00* 6.70*  --  4.70*  CALCIUM 8.7*  --  9.0  --  9.0  PHOS  --   --  6.8*  --   --    Liver Function Tests: Recent Labs  Lab 01/21/22 2305 01/22/22 0506  AST 37 28  ALT 52* 47*  ALKPHOS 55 45  BILITOT 0.4 0.4  PROT 5.6* 5.2*  ALBUMIN 2.4* 2.3*   CBC: Recent Labs  Lab 01/21/22 2305 01/22/22 0017 01/22/22 0506 01/22/22 1243 01/22/22 1257 01/22/22 1746 01/23/22 0422  WBC 23.6*  --  20.1*  --   --   --  11.8*  NEUTROABS 19.8*  --   --   --   --   --   --   HGB 9.0*   < > 7.5*   < > 7.5* 10.1* 8.4*  HCT 29.5*   < > 25.5*   < > 22.0* 30.8* 25.4*  MCV 91.3  --  93.8  --   --   --  86.7  PLT 325  --   294  --   --   --  179   < > = values in this interval not displayed.   Blood Culture    Component Value Date/Time   SDES BLOOD RIGHT ANTECUBITAL 01/22/2022 0038   SPECREQUEST  01/22/2022 0038    BOTTLES DRAWN AEROBIC AND ANAEROBIC Blood Culture results may not be optimal due to an inadequate volume of blood received in culture bottles   CULT  01/22/2022 0038    NO GROWTH 1 DAY Performed at [facility], [place] [address], [place], [place] [address]    REPTSTATUS PENDING 01/22/2022 0038   Studies/Results: CT Renal Stone Study  Result Date: 01/22/2022 CLINICAL DATA:  Abdominal flank pain. EXAM: CT ABDOMEN AND PELVIS WITHOUT CONTRAST TECHNIQUE: Multidetector CT imaging of the abdomen and pelvis was performed following the standard protocol without IV contrast.  RADIATION DOSE REDUCTION: This exam was performed according to the departmental dose-optimization program which includes automated exposure control, adjustment of the mA and/or kV according to patient size and/or use of iterative reconstruction technique. COMPARISON:  CT abdomen pelvis dated 11/12/2021. FINDINGS: Evaluation of this exam is limited in the absence of intravenous contrast. Lower chest: Partially visualized small left pleural effusion. There is coronary vascular calcification. No intra-abdominal free air or free fluid. Hepatobiliary: The liver is unremarkable. There is mild biliary dilatation, post cholecystectomy. Pancreas: The pancreas is atrophic and poorly visualized. Spleen: The spleen is unremarkable. Adrenals/Urinary Tract: The right adrenal glands unremarkable. The left adrenal gland is not visualized. There has been interval development of a large left renal subcapsular hematoma extending into the perirenal space. The hematoma measures approximately 6.2 x 8.9 cm in greatest axial dimensions and 14 cm in craniocaudal length. There is mass effect and compression of the left renal parenchyma. Please note evaluation for  possible active bleed is limited on this noncontrast CT. Similar positioning of the percutaneous drainage catheter and left pigtail ureteral stent. No hydronephrosis. Moderate right renal atrophy with several cysts and additional smaller hypodense lesions which are not characterized. There is no hydronephrosis on the right. The right ureter and urinary bladder appear unremarkable. Stomach/Bowel: Postsurgical changes of the distal stomach. The stomach is distended. High attenuating focus within the distal stomach likely ingested content. There is no bowel obstruction. The appendix is not visualized with certainty. No inflammatory changes identified in the right lower quadrant. Vascular/Lymphatic: Advanced aortoiliac atherosclerotic disease. The IVC is unremarkable. No portal venous gas. No obvious adenopathy. Reproductive: Hysterectomy. Other: None Musculoskeletal: Osteopenia with degenerative changes of the spine. No acute osseous pathology. IMPRESSION: 1. Large left renal subcapsular hematoma extending into the perirenal space. 2. Similar positioning of the percutaneous drainage catheter and left pigtail ureteral stent. No hydronephrosis. 3. No bowel obstruction. 4. Partially visualized small left pleural effusion. 5.  Aortic Atherosclerosis (ICD10-I70.0). These results were called by telephone at the time of interpretation on 01/22/2022 at 12:59 am to provider [facility] , who verbally acknowledged these results. Electronically Signed   By: Anner Crete M.D.   On: 01/22/2022 01:05   DG Chest Port 1 View  Result Date: 01/22/2022 CLINICAL DATA:  Possible sepsis. EXAM: PORTABLE CHEST 1 VIEW COMPARISON:  10/29/2021. FINDINGS: The heart is enlarged and the mediastinal contour is within normal limits. Atherosclerotic calcification of the aorta is noted. Lung volumes are low with atelectasis at the lung bases. No definite effusion or pneumothorax. Surgical clips are noted in the cervical soft tissues on the  right. No acute osseous abnormality. IMPRESSION: 1. Low lung volumes with atelectasis at the lung bases. 2. Cardiomegaly. Electronically Signed   By: Brett Fairy M.D.   On: 01/22/2022 00:30    Medications:  ceFEPime (MAXIPIME) IV 1 g (01/23/22 0208)    sodium chloride   Intravenous Once   sodium chloride   Intravenous Once   amiodarone  100 mg Oral Daily   cycloSPORINE  1 drop Both Eyes BID   insulin aspart  0-9 Units Subcutaneous Q4H   lipase/protease/amylase  36,000 Units Oral BID WC   metroNIDAZOLE  500 mg Oral Q12H   pantoprazole  40 mg Oral Daily   rOPINIRole  0.25 mg Oral QHS   senna  1 tablet Oral QHS   sevelamer carbonate  1,600 mg Oral TID WC    Dialysis Orders: MWF at [facility] 3:45hr, 350/A1.5, EDW 58kg, 2K/2Ca, AVF, 16g  needles, no heparin - Calcitriol 1.49mg PO q HD - No recent ESA - was on hold for Hgb 12.9 on 01/16/22 - Binders: Renvela 2/meals.   Assessment/Plan:  L renal hematoma: In setting of prior urinoma s/p RCC ablation. Hematoma is not communicating with current JP drain as her bag is not bloody.  Urology and IR involved.  Sepsis: WBC and LA very high - started on IV Cefepime - both improved today.  ESRD: Usual MWF schedule - was not dialyzed 12/4, but did have HD 12/5. Will just keep her off-schedule this week -> next HD tomorrow (Thurs), then get back on MWF sched at some point.  Hypertension/volume: BP back up, no edema on exam.  Anemia of ESRD + ABLA: Hgb dropped from 12.9 last week ->7.5 on admit. 2U PRBCs given 12/5. Eliquis on hold. Hgb 10.1 -> 8.4 today. Will give a dose of Aranesp today as well.  Metabolic bone disease: CorrCa high, Phos high. Hold VDRA for now, resume home binders.  Nutrition:  Alb low, add supplements.  T2DM  A-fib: Home Eliquis on hold.  KVeneta Penton PA-C 01/23/2022, 11:20 AM  [employer]

## 2022-01-23 NOTE — Telephone Encounter (Signed)
Kim Burns, P.A. reached out to let me know through secure chat that he has talked to both the pt's son and pt. No further action is needed.

## 2022-01-23 NOTE — Progress Notes (Signed)
   CTA has been performed and Dr Annamaria Boots has reviewed imaging CTA read as negative for any bleed.  Recommend transfusion as needed per TRH/ PCCM.  IR has no other procedure to offer now or likely in future. Thank you

## 2022-01-23 NOTE — Progress Notes (Signed)
   Pt known to IR See previous note of Dr Anselm Pancoast last pm  Hg 8.4 this am Has been seen by PCCM this am  Discussed case with Dr Annamaria Boots--- will order CTA Abd/Pel stat now To determine next plan---

## 2022-01-24 DIAGNOSIS — S37012A Minor contusion of left kidney, initial encounter: Secondary | ICD-10-CM | POA: Diagnosis not present

## 2022-01-24 LAB — CBC WITH DIFFERENTIAL/PLATELET
Abs Immature Granulocytes: 0.05 10*3/uL (ref 0.00–0.07)
Basophils Absolute: 0.1 10*3/uL (ref 0.0–0.1)
Basophils Relative: 0 %
Eosinophils Absolute: 0 10*3/uL (ref 0.0–0.5)
Eosinophils Relative: 0 %
HCT: 25.8 % — ABNORMAL LOW (ref 36.0–46.0)
Hemoglobin: 8.4 g/dL — ABNORMAL LOW (ref 12.0–15.0)
Immature Granulocytes: 0 %
Lymphocytes Relative: 10 %
Lymphs Abs: 1.2 10*3/uL (ref 0.7–4.0)
MCH: 29.1 pg (ref 26.0–34.0)
MCHC: 32.6 g/dL (ref 30.0–36.0)
MCV: 89.3 fL (ref 80.0–100.0)
Monocytes Absolute: 0.6 10*3/uL (ref 0.1–1.0)
Monocytes Relative: 5 %
Neutro Abs: 10.5 10*3/uL — ABNORMAL HIGH (ref 1.7–7.7)
Neutrophils Relative %: 85 %
Platelets: 212 10*3/uL (ref 150–400)
RBC: 2.89 MIL/uL — ABNORMAL LOW (ref 3.87–5.11)
RDW: 17.5 % — ABNORMAL HIGH (ref 11.5–15.5)
WBC: 12.4 10*3/uL — ABNORMAL HIGH (ref 4.0–10.5)
nRBC: 0 % (ref 0.0–0.2)

## 2022-01-24 LAB — BASIC METABOLIC PANEL
Anion gap: 14 (ref 5–15)
BUN: 50 mg/dL — ABNORMAL HIGH (ref 8–23)
CO2: 23 mmol/L (ref 22–32)
Calcium: 8.6 mg/dL — ABNORMAL LOW (ref 8.9–10.3)
Chloride: 98 mmol/L (ref 98–111)
Creatinine, Ser: 6.06 mg/dL — ABNORMAL HIGH (ref 0.44–1.00)
GFR, Estimated: 6 mL/min — ABNORMAL LOW (ref >60)
Glucose, Bld: 154 mg/dL — ABNORMAL HIGH (ref 70–99)
Potassium: 4.2 mmol/L (ref 3.5–5.1)
Sodium: 135 mmol/L (ref 135–145)

## 2022-01-24 LAB — GLUCOSE, CAPILLARY
Glucose-Capillary: 120 mg/dL — ABNORMAL HIGH (ref 70–99)
Glucose-Capillary: 147 mg/dL — ABNORMAL HIGH (ref 70–99)
Glucose-Capillary: 153 mg/dL — ABNORMAL HIGH (ref 70–99)
Glucose-Capillary: 161 mg/dL — ABNORMAL HIGH (ref 70–99)
Glucose-Capillary: 198 mg/dL — ABNORMAL HIGH (ref 70–99)
Glucose-Capillary: 84 mg/dL (ref 70–99)

## 2022-01-24 MED ORDER — DARBEPOETIN ALFA 100 MCG/0.5ML IJ SOSY
100.0000 ug | PREFILLED_SYRINGE | INTRAMUSCULAR | Status: DC
  Administered 2022-01-24: 100 ug via SUBCUTANEOUS
  Filled 2022-01-24 (×2): qty 0.5

## 2022-01-24 MED ORDER — HYDRALAZINE HCL 20 MG/ML IJ SOLN: 10.0000 mg | Freq: Four times a day (QID) | INTRAMUSCULAR | Status: DC | PRN

## 2022-01-24 MED ORDER — CARVEDILOL 3.125 MG PO TABS
3.1250 mg | ORAL_TABLET | Freq: Two times a day (BID) | ORAL | Status: DC
  Administered 2022-01-25 – 2022-01-31 (×10): 3.125 mg via ORAL
  Filled 2022-01-24 (×11): qty 1

## 2022-01-24 NOTE — Progress Notes (Signed)
Interventional Radiology Brief Note  Patient continues with conservative management with stability per primary team. Case reviewed with Dr. Laurence Ferrari today given [name] is followed by IR for outpatient management of her perinephric drain.  Continue current care related to drain and plan to resume outpatient follow up as scheduled. Next appt with IR was scheduled for 12/13 which patient can keep if she is discharged home.   Brynda Greathouse PA-C

## 2022-01-24 NOTE — Plan of Care (Signed)

## 2022-01-24 NOTE — Progress Notes (Signed)
[facility] Progress Note   Subjective:  Seen in her room - she has no new issues.  Labs are being drawn at time of exam.  She says pain is stable.  Feels hands edematous and having some DOE  Objective Vitals:   01/23/22 1200 01/23/22 1900 01/24/22 0439 01/24/22 0731  BP: (!) 142/67 (!) 150/66 (!) 149/73 (!) 169/91  Pulse: 98 92 89 100  Resp: 20 18 (!) 23 20  Temp: 98 F (36.7 C) 98.1 F (36.7 C) 97.9 F (36.6 C) 98 F (36.7 C)  TempSrc: Oral Oral Oral Oral  SpO2:  95% 95% 96%  Weight:      Height:       Physical Exam General: Frail woman, NAD. [place] O2 in place Heart: RRR; 2/6 murmur Lungs: CTAB anteriorly Abdomen: soft, L flank JP drain with cloudy yellow-brown output Extremities: trace edema Dialysis Access: LUE AVF + bruit  Additional Objective Labs: Basic Metabolic Panel: Recent Labs  Lab 01/21/22 2305 01/22/22 0017 01/22/22 0506 01/22/22 1257 01/23/22 0422  NA 136 136 138 138 137  K 4.3 4.3 5.4* 4.8 3.7  CL 104 105 105  --  96*  CO2 19*  --  20*  --  26  GLUCOSE 378* 425* 379*  --  129*  BUN 59* 58* 62*  --  38*  CREATININE 6.46* 7.00* 6.70*  --  4.70*  CALCIUM 8.7*  --  9.0  --  9.0  PHOS  --   --  6.8*  --   --     Liver Function Tests: Recent Labs  Lab 01/21/22 2305 01/22/22 0506  AST 37 28  ALT 52* 47*  ALKPHOS 55 45  BILITOT 0.4 0.4  PROT 5.6* 5.2*  ALBUMIN 2.4* 2.3*    CBC: Recent Labs  Lab 01/21/22 2305 01/22/22 0017 01/22/22 0506 01/22/22 1243 01/22/22 1746 01/23/22 0422 01/23/22 1224  WBC 23.6*  --  20.1*  --   --  11.8* 11.7*  NEUTROABS 19.8*  --   --   --   --   --   --   HGB 9.0*   < > 7.5*   < > 10.1* 8.4* 8.1*  HCT 29.5*   < > 25.5*   < > 30.8* 25.4* 24.0*  MCV 91.3  --  93.8  --   --  86.7 87.0  PLT 325  --  294  --   --  179 165   < > = values in this interval not displayed.    Blood Culture    Component Value Date/Time   SDES BLOOD RIGHT ANTECUBITAL 01/22/2022 0038   SPECREQUEST  01/22/2022 0038     BOTTLES DRAWN AEROBIC AND ANAEROBIC Blood Culture results may not be optimal due to an inadequate volume of blood received in culture bottles   CULT  01/22/2022 0038    NO GROWTH 1 DAY Performed at [facility], [place] [address], [place], [place] [address]    REPTSTATUS PENDING 01/22/2022 0038   Studies/Results: CT Angio Abd/Pel w/ and/or w/o  Result Date: 01/23/2022 CLINICAL DATA:  Acute left-sided subcapsular renal and perinephric hemorrhage. EXAM: CT ANGIOGRAPHY ABDOMEN AND PELVIS WITH CONTRAST AND WITHOUT CONTRAST TECHNIQUE: Multidetector CT imaging of the abdomen and pelvis was performed using the standard protocol during bolus administration of intravenous contrast. Multiplanar reconstructed images and MIPs were obtained and reviewed to evaluate the vascular anatomy. RADIATION DOSE REDUCTION: This exam was performed according to the departmental dose-optimization program  which includes automated exposure control, adjustment of the mA and/or kV according to patient size and/or use of iterative reconstruction technique. CONTRAST:  76m OMNIPAQUE IOHEXOL 350 MG/ML SOLN COMPARISON:  Non-contrast CT of the abdomen and pelvis on 01/22/2022 FINDINGS: VASCULAR Aorta: Atherosclerosis of the abdominal aorta without evidence of aneurysm or dissection. Celiac: Normally patent. Normally patent branch vessels and branch vessel anatomy. SMA: Normally patent. Renals: Calcified plaque at origins of bilateral single renal arteries without significant stenosis. IMA: Normally patent. Inflow: Normally patent bilateral iliac arteries without aneurysm. Proximal Outflow: Normally patent bilateral common femoral arteries and femoral bifurcations. Closure device ring on top of the right common femoral artery without mass effect or pseudoaneurysm. Veins: No venous abnormalities on venous phase imaging. Review of the MIP images confirms the above findings. NON-VASCULAR Lower chest: Small left pleural effusion and mild  left basilar atelectasis. Hepatobiliary: No focal liver abnormality is seen. Status post cholecystectomy. No biliary dilatation. Pancreas: Status post Whipple procedure and pancreatectomy. Spleen: Normal in size without focal abnormality. Adrenals/Urinary Tract: Stable left posterior subcapsular and perinephric hematoma measuring approximately 9 cm in greatest diameter. This is stable when measuring at the same level on the prior unenhanced CT. No evidence of active bleeding on arterial or venous phases of imaging. No arterial pseudoaneurysm identified. Underlying left kidney demonstrates atrophy and some degree of underlying hydronephrosis. A ureteral stent is present extending from the collecting system into the bladder. Additional percutaneous drainage catheter remains present just inferior to the lower pole collecting system without fluid collection. Stomach/Bowel: No evidence of bowel obstruction, ileus or free intraperitoneal air. Lymphatic: No enlarged abdominal or pelvic lymph nodes. Reproductive: Status post hysterectomy. No adnexal masses. Other: No abdominal wall hernia or abnormality. No abdominopelvic ascites. Musculoskeletal: Stable degenerative disc disease of the lumbar spine. Stable osteopenia. No bony lesions or fractures. IMPRESSION: 1. Stable left posterior subcapsular and perinephric hematoma measuring approximately 9 cm in greatest diameter. No evidence of active bleeding on arterial or venous phases of imaging. No arterial pseudoaneurysm identified. 2. Underlying left renal atrophy and some degree of underlying hydronephrosis. A ureteral stent is present extending from the collecting system into the bladder. Additional percutaneous drainage catheter remains present just inferior to the lower pole collecting system. 3. Small left pleural effusion and mild left basilar atelectasis. 4. Aortic atherosclerosis. Aortic Atherosclerosis (ICD10-I70.0). Electronically Signed   By: GAletta EdouardM.D.    On: 01/23/2022 12:29    Medications:  ceFEPime (MAXIPIME) IV 1 g (01/24/22 0025)    (feeding supplement) PROSource Plus  30 mL Oral BID BM   sodium chloride   Intravenous Once   sodium chloride   Intravenous Once   amiodarone  100 mg Oral Daily   cycloSPORINE  1 drop Both Eyes BID   darbepoetin (ARANESP) injection - NON-DIALYSIS  100 mcg Subcutaneous Q Wed-1800   insulin aspart  0-9 Units Subcutaneous Q4H   lipase/protease/amylase  36,000 Units Oral BID WC   metroNIDAZOLE  500 mg Oral Q12H   pantoprazole  40 mg Oral Daily   rOPINIRole  0.25 mg Oral QHS   senna  1 tablet Oral QHS   sevelamer carbonate  1,600 mg Oral TID WC    [facility]/A1.5, EDW 58kg, 2K/2Ca, AVF, 16g needles, no heparin - Calcitriol 1.277m PO q HD - No recent ESA - was on hold for Hgb 12.9 on 01/16/22 - Binders: Renvela 2/meals.   Assessment/Plan:  L renal hematoma: In setting of prior  urinoma s/p RCC ablation. Hematoma is not communicating with current JP drain as her bag is not bloody.  Urology and IR involved.  Sepsis: WBC and LA very high - started on IV Cefepime - both improving.  ESRD: Usual MWF schedule - was not dialyzed 12/4, but did have HD 12/5. Will just keep her off-schedule this week -> next HD today (Thurs), then get back on MWF sched at some point -- will plan for HD Friday in case she is stable for d/c. If she is staying anyway we'll just do Saturday AM and then Monday.  Hypertension/volume: BP back up, for UF with HD today.   Anemia of ESRD + ABLA: Hgb dropped from 12.9 last week ->7.5 on admit. 2U PRBCs given 12/5. Eliquis on hold. Hgb 10.1 -> 8.4 > 8.1 yest - pending this AM. Will give a dose of Aranesp today as well.  Metabolic bone disease: CorrCa high, Phos high. Hold VDRA for now, resumed home binders.  Nutrition:  Alb low, add supplements.  T2DM  A-fib: Home Eliquis on hold.  Jannifer Hick MD [facility] Pager [phone]

## 2022-01-24 NOTE — Progress Notes (Signed)
PROGRESS NOTE                                                                                                                                                                                                             Patient Demographics:    Kim Burns, is a 86 y.o. female, DOB - 1936/02/17, DJM:[id number]  Outpatient Primary MD for the patient is [name], MD    LOS - 2  Admit date - 01/21/2022    Chief Complaint  Patient presents with   Back Pain       Brief Narrative (HPI from H&P)   86 year old female history of renal cell mass status post nephrostomy tube, ureteral stent, ESRD on HD MWF PAF on Eliquis, chronic diastolic CHF nonobstructive CAD, previous CVA presents with severe back pain found to have subcapsular hematoma.   See was having intermittent hematuria x3 wk,returned from a trip from [place] 12/4 and started having severe diffuse back pain during night so came to the ED.   In the ED had abnormal EKG with subtle inferior ST elevation and ST depression in lateral lead EDP discussed with on-call cardiology Dr. Irish Lack patient generally clean cath in July 2023 with 20% stenosis of RCA-advised holding off on cath and continue to repeat EKG follow-up troponins> +/- heparin if significant bump in troponin.  Labs showed significant leukocytosis, elevated creatinine 6.4, troponin 25 > 28 hemoglobin 9.0> 9.5>7.,5 blood glucose 425, lactic acidosis 5.7> 5.4> 4.3.CT renal study showed large left renal subcapsular hematoma extending into the perirenal space left pigtail ureteral stent in place, no hydronephrosis, urology  Dr Cain Sieve was consulted> advised that she does  Kcentra was given to reverse Eliquis.  Patient was started on IV antibiotics blood cultures and also start gentle IV fluids and admitted.   Subjective:   Patient in bed appears to be in no distress, mild generalized headache, no chest pain or  shortness of breath, left-sided flank pain improving.  No focal weakness.   Assessment  & Plan :    Large left renal subcapsular hematoma. Intermittent hematuria x3 weeks. S/p left tumor ablation c/b urine leak-refractory to management and with an indwelling stent and nephrostomy tube: Hemoglobin drifting down, has been on movement and exam. She is s/p 2 units of packed RBC transfusion, also received Kcentra on 01/22/2022, bleeding seems to  have held for now.  Seen by urology and IR.  CTA on 01/23/2022 shows no replete, per IR monitor.  Case discussed in detail with Dr. Louis Meckel patient's urologist, who wants to hold anticoagulation for at least 3 weeks thereafter cautious retrial of anticoagulation.  Currently no role for nephrectomy as the risk of procedure outweighs the benefits.  Continue to monitor CBC and provide supportive care for now.  Acute blood loss anemia, anemia of chronic renal disease:  see above.  Suspected Severe Sepsis POA-given patient's lactic acidosis/leukocytosis, possible intra-abdominal source. History of left perinephric abscess: Keep on IV antibiotics include cephalosporin and Flagyl started earlier.  Sepsis pathophysiology seems to have resolved.  Continue to monitor.  Follow cultures.  ESRD on HD MWF: Nephrology following HD per schedule.  Previous CVA, PAF on Eliquis: Eliquis discontinued,but will need to d/w risks/benefits;given her stroke history she is high risk w/ CHA2DS2-VASc Score = 7,  this was explained to patient and patient's son [name].  Upon family request cardiology team consulted who agreed with our management and signed off on 01/23/2022, home dose amiodarone continued, Coreg resumed.  Chronic diastolic CHF, EF 15% on recent echocardiogram: Compensated now.  Hypertension - blood pressure is improved to reintroduce Coreg.  As needed hydralazine as well.  Type 2 diabetes mellitus with uncontrolled hyperglycemia, on long-term insulin: Continue SSI and  monitor   CBG (last 3)  Recent Labs    01/24/22 0420 01/24/22 0728 01/24/22 1126  GLUCAP 153* 147* 161*        Condition - Extremely Guarded  Family Communication  :    [name] [phone]  - 01/23/22, 01/24/22  Code Status :  Full  Consults  :  Urology, PCCM , IR, Renal, Cardiology  PUD Prophylaxis : PPI   Procedures  :     CT - 1. Large left renal subcapsular hematoma extending into the perirenal space. 2. Similar positioning of the percutaneous drainage catheter and left pigtail ureteral stent. No hydronephrosis. 3. No bowel obstruction. 4. Partially visualized small left pleural effusion. 5.  Aortic Atherosclerosis.      Disposition Plan  :    Status is: Inpatient  DVT Prophylaxis  :    Place and maintain sequential compression device Start: 01/23/22 0601 SCDs Start: 01/22/22 0227    Lab Results  Component Value Date   PLT 212 01/24/2022    Diet :  Diet Order             DIET SOFT Fluid consistency: Thin  Diet effective now                    Inpatient Medications  Scheduled Meds:  (feeding supplement) PROSource Plus  30 mL Oral BID BM   sodium chloride   Intravenous Once   sodium chloride   Intravenous Once   amiodarone  100 mg Oral Daily   carvedilol  3.125 mg Oral BID WC   cycloSPORINE  1 drop Both Eyes BID   darbepoetin (ARANESP) injection - NON-DIALYSIS  100 mcg Subcutaneous Q Wed-1800   insulin aspart  0-9 Units Subcutaneous Q4H   lipase/protease/amylase  36,000 Units Oral BID WC   metroNIDAZOLE  500 mg Oral Q12H   pantoprazole  40 mg Oral Daily   rOPINIRole  0.25 mg Oral QHS   senna  1 tablet Oral QHS   sevelamer carbonate  1,600 mg Oral TID WC   Continuous Infusions:  ceFEPime (MAXIPIME) IV 1 g (01/24/22 0025)   PRN  Meds:.acetaminophen, dextrose, hydrALAZINE, HYDROmorphone (DILAUDID) injection, oxyCODONE, polyethylene glycol, prochlorperazine       Objective:   Vitals:   01/23/22 1900 01/24/22 0439 01/24/22 0731  01/24/22 1126  BP: (!) 150/66 (!) 149/73 (!) 169/91 (!) 161/77  Pulse: 92 89 100 98  Resp: 18 (!) 23 20 (!) 24  Temp: 98.1 F (36.7 C) 97.9 F (36.6 C) 98 F (36.7 C) 97.9 F (36.6 C)  TempSrc: Oral Oral Oral Oral  SpO2: 95% 95% 96% 92%  Weight:      Height:        Wt Readings from Last 3 Encounters:  01/21/22 58 kg  11/23/21 58.8 kg  11/07/21 58.5 kg     Intake/Output Summary (Last 24 hours) at 01/24/2022 1146 Last data filed at 01/24/2022 1000 Gross per 24 hour  Intake 220 ml  Output --  Net 220 ml     Physical Exam  Awake Alert, No new F.N deficits, left external nephrostomy tube/drain in place [email or web address].AT,PERRAL Supple Neck, No JVD,   Symmetrical Chest wall movement, Good air movement bilaterally, CTAB RRR,No Gallops, Rubs or new Murmurs,  +ve B.Sounds, Abd Soft, No tenderness,   No Cyanosis, Clubbing or edema       Data Review:    Recent Labs  Lab 01/21/22 2305 01/22/22 0017 01/22/22 0506 01/22/22 1243 01/22/22 1257 01/22/22 1746 01/23/22 0422 01/23/22 1224 01/24/22 0736  WBC 23.6*  --  20.1*  --   --   --  11.8* 11.7* 12.4*  HGB 9.0*   < > 7.5*   < > 7.5* 10.1* 8.4* 8.1* 8.4*  HCT 29.5*   < > 25.5*   < > 22.0* 30.8* 25.4* 24.0* 25.8*  PLT 325  --  294  --   --   --  179 165 212  MCV 91.3  --  93.8  --   --   --  86.7 87.0 89.3  MCH 27.9  --  27.6  --   --   --  28.7 29.3 29.1  MCHC 30.5  --  29.4*  --   --   --  33.1 33.8 32.6  RDW 18.8*  --  19.0*  --   --   --  17.2* 17.6* 17.5*  LYMPHSABS 2.7  --   --   --   --   --   --   --  1.2  MONOABS 0.6  --   --   --   --   --   --   --  0.6  EOSABS 0.1  --   --   --   --   --   --   --  0.0  BASOSABS 0.1  --   --   --   --   --   --   --  0.1   < > = values in this interval not displayed.    Recent Labs  Lab 01/21/22 2305 01/22/22 0001 01/22/22 0017 01/22/22 0038 01/22/22 0506 01/22/22 0545 01/22/22 0857 01/22/22 1243 01/22/22 1257 01/22/22 1745 01/23/22 0422 01/24/22 0736  NA 136  --  136   --  138  --   --   --  138  --  137 135  K 4.3  --  4.3  --  5.4*  --   --   --  4.8  --  3.7 4.2  CL 104  --  105  --  105  --   --   --   --   --  96* 98  CO2 19*  --   --   --  20*  --   --   --   --   --  26 23  GLUCOSE 378*  --  425*  --  379*  --   --   --   --   --  129* 154*  BUN 59*  --  58*  --  62*  --   --   --   --   --  38* 50*  CREATININE 6.46*  --  7.00*  --  6.70*  --   --   --   --   --  4.70* 6.06*  AST 37  --   --   --  28  --   --   --   --   --   --   --   ALT 52*  --   --   --  47*  --   --   --   --   --   --   --   ALKPHOS 55  --   --   --  45  --   --   --   --   --   --   --   BILITOT 0.4  --   --   --  0.4  --   --   --   --   --   --   --   ALBUMIN 2.4*  --   --   --  2.3*  --   --   --   --   --   --   --   LATICACIDVEN  --    < >  --  5.7*  --  4.3* 5.0* 3.5*  --  3.0* 1.5  --   INR  --   --   --  1.4*  --   --   --   --   --   --   --   --   HGBA1C  --   --   --   --  6.6*  --   --   --   --   --   --   --   MG  --   --   --   --  2.8*  --   --   --   --   --   --   --   CALCIUM 8.7*  --   --   --  9.0  --   --   --   --   --  9.0 8.6*   < > = values in this interval not displayed.      Radiology Reports CT Angio Abd/Pel w/ and/or w/o  Result Date: 01/23/2022 CLINICAL DATA:  Acute left-sided subcapsular renal and perinephric hemorrhage. EXAM: CT ANGIOGRAPHY ABDOMEN AND PELVIS WITH CONTRAST AND WITHOUT CONTRAST TECHNIQUE: Multidetector CT imaging of the abdomen and pelvis was performed using the standard protocol during bolus administration of intravenous contrast. Multiplanar reconstructed images and MIPs were obtained and reviewed to evaluate the vascular anatomy. RADIATION DOSE REDUCTION: This exam was performed according to the departmental dose-optimization program which includes automated exposure control, adjustment of the mA and/or kV according to patient size and/or use of iterative reconstruction technique. CONTRAST:  64m OMNIPAQUE IOHEXOL 350 MG/ML  SOLN COMPARISON:  Non-contrast CT of the abdomen and pelvis on 01/22/2022 FINDINGS: VASCULAR Aorta: Atherosclerosis of the abdominal aorta without evidence of  aneurysm or dissection. Celiac: Normally patent. Normally patent branch vessels and branch vessel anatomy. SMA: Normally patent. Renals: Calcified plaque at origins of bilateral single renal arteries without significant stenosis. IMA: Normally patent. Inflow: Normally patent bilateral iliac arteries without aneurysm. Proximal Outflow: Normally patent bilateral common femoral arteries and femoral bifurcations. Closure device ring on top of the right common femoral artery without mass effect or pseudoaneurysm. Veins: No venous abnormalities on venous phase imaging. Review of the MIP images confirms the above findings. NON-VASCULAR Lower chest: Small left pleural effusion and mild left basilar atelectasis. Hepatobiliary: No focal liver abnormality is seen. Status post cholecystectomy. No biliary dilatation. Pancreas: Status post Whipple procedure and pancreatectomy. Spleen: Normal in size without focal abnormality. Adrenals/Urinary Tract: Stable left posterior subcapsular and perinephric hematoma measuring approximately 9 cm in greatest diameter. This is stable when measuring at the same level on the prior unenhanced CT. No evidence of active bleeding on arterial or venous phases of imaging. No arterial pseudoaneurysm identified. Underlying left kidney demonstrates atrophy and some degree of underlying hydronephrosis. A ureteral stent is present extending from the collecting system into the bladder. Additional percutaneous drainage catheter remains present just inferior to the lower pole collecting system without fluid collection. Stomach/Bowel: No evidence of bowel obstruction, ileus or free intraperitoneal air. Lymphatic: No enlarged abdominal or pelvic lymph nodes. Reproductive: Status post hysterectomy. No adnexal masses. Other: No abdominal wall hernia or  abnormality. No abdominopelvic ascites. Musculoskeletal: Stable degenerative disc disease of the lumbar spine. Stable osteopenia. No bony lesions or fractures. IMPRESSION: 1. Stable left posterior subcapsular and perinephric hematoma measuring approximately 9 cm in greatest diameter. No evidence of active bleeding on arterial or venous phases of imaging. No arterial pseudoaneurysm identified. 2. Underlying left renal atrophy and some degree of underlying hydronephrosis. A ureteral stent is present extending from the collecting system into the bladder. Additional percutaneous drainage catheter remains present just inferior to the lower pole collecting system. 3. Small left pleural effusion and mild left basilar atelectasis. 4. Aortic atherosclerosis. Aortic Atherosclerosis (ICD10-I70.0). Electronically Signed   By: Aletta Edouard M.D.   On: 01/23/2022 12:29   CT Renal Stone Study  Result Date: 01/22/2022 CLINICAL DATA:  Abdominal flank pain. EXAM: CT ABDOMEN AND PELVIS WITHOUT CONTRAST TECHNIQUE: Multidetector CT imaging of the abdomen and pelvis was performed following the standard protocol without IV contrast. RADIATION DOSE REDUCTION: This exam was performed according to the departmental dose-optimization program which includes automated exposure control, adjustment of the mA and/or kV according to patient size and/or use of iterative reconstruction technique. COMPARISON:  CT abdomen pelvis dated 11/12/2021. FINDINGS: Evaluation of this exam is limited in the absence of intravenous contrast. Lower chest: Partially visualized small left pleural effusion. There is coronary vascular calcification. No intra-abdominal free air or free fluid. Hepatobiliary: The liver is unremarkable. There is mild biliary dilatation, post cholecystectomy. Pancreas: The pancreas is atrophic and poorly visualized. Spleen: The spleen is unremarkable. Adrenals/Urinary Tract: The right adrenal glands unremarkable. The left adrenal gland  is not visualized. There has been interval development of a large left renal subcapsular hematoma extending into the perirenal space. The hematoma measures approximately 6.2 x 8.9 cm in greatest axial dimensions and 14 cm in craniocaudal length. There is mass effect and compression of the left renal parenchyma. Please note evaluation for possible active bleed is limited on this noncontrast CT. Similar positioning of the percutaneous drainage catheter and left pigtail ureteral stent. No hydronephrosis. Moderate right renal atrophy with several cysts and  additional smaller hypodense lesions which are not characterized. There is no hydronephrosis on the right. The right ureter and urinary bladder appear unremarkable. Stomach/Bowel: Postsurgical changes of the distal stomach. The stomach is distended. High attenuating focus within the distal stomach likely ingested content. There is no bowel obstruction. The appendix is not visualized with certainty. No inflammatory changes identified in the right lower quadrant. Vascular/Lymphatic: Advanced aortoiliac atherosclerotic disease. The IVC is unremarkable. No portal venous gas. No obvious adenopathy. Reproductive: Hysterectomy. Other: None Musculoskeletal: Osteopenia with degenerative changes of the spine. No acute osseous pathology. IMPRESSION: 1. Large left renal subcapsular hematoma extending into the perirenal space. 2. Similar positioning of the percutaneous drainage catheter and left pigtail ureteral stent. No hydronephrosis. 3. No bowel obstruction. 4. Partially visualized small left pleural effusion. 5.  Aortic Atherosclerosis (ICD10-I70.0). These results were called by telephone at the time of interpretation on 01/22/2022 at 12:59 am to provider [facility] , who verbally acknowledged these results. Electronically Signed   By: Anner Crete M.D.   On: 01/22/2022 01:05   DG Chest Port 1 View  Result Date: 01/22/2022 CLINICAL DATA:  Possible sepsis. EXAM:  PORTABLE CHEST 1 VIEW COMPARISON:  10/29/2021. FINDINGS: The heart is enlarged and the mediastinal contour is within normal limits. Atherosclerotic calcification of the aorta is noted. Lung volumes are low with atelectasis at the lung bases. No definite effusion or pneumothorax. Surgical clips are noted in the cervical soft tissues on the right. No acute osseous abnormality. IMPRESSION: 1. Low lung volumes with atelectasis at the lung bases. 2. Cardiomegaly. Electronically Signed   By: Brett Fairy M.D.   On: 01/22/2022 00:30      Signature  -   Lala Lund M.D on 01/24/2022 at 11:46 AM   -  To page go to [email or web address]

## 2022-01-25 DIAGNOSIS — S37012A Minor contusion of left kidney, initial encounter: Secondary | ICD-10-CM | POA: Diagnosis not present

## 2022-01-25 LAB — CBC WITH DIFFERENTIAL/PLATELET
Abs Immature Granulocytes: 0.05 10*3/uL (ref 0.00–0.07)
Basophils Absolute: 0 10*3/uL (ref 0.0–0.1)
Basophils Relative: 0 %
Eosinophils Absolute: 0.1 10*3/uL (ref 0.0–0.5)
Eosinophils Relative: 1 %
HCT: 24.1 % — ABNORMAL LOW (ref 36.0–46.0)
Hemoglobin: 8 g/dL — ABNORMAL LOW (ref 12.0–15.0)
Immature Granulocytes: 1 %
Lymphocytes Relative: 7 %
Lymphs Abs: 0.8 10*3/uL (ref 0.7–4.0)
MCH: 29.5 pg (ref 26.0–34.0)
MCHC: 33.2 g/dL (ref 30.0–36.0)
MCV: 88.9 fL (ref 80.0–100.0)
Monocytes Absolute: 0.6 10*3/uL (ref 0.1–1.0)
Monocytes Relative: 5 %
Neutro Abs: 9.2 10*3/uL — ABNORMAL HIGH (ref 1.7–7.7)
Neutrophils Relative %: 86 %
Platelets: 194 10*3/uL (ref 150–400)
RBC: 2.71 MIL/uL — ABNORMAL LOW (ref 3.87–5.11)
RDW: 17.5 % — ABNORMAL HIGH (ref 11.5–15.5)
WBC: 10.7 10*3/uL — ABNORMAL HIGH (ref 4.0–10.5)
nRBC: 0 % (ref 0.0–0.2)

## 2022-01-25 LAB — BASIC METABOLIC PANEL
Anion gap: 11 (ref 5–15)
BUN: 20 mg/dL (ref 8–23)
CO2: 28 mmol/L (ref 22–32)
Calcium: 8.1 mg/dL — ABNORMAL LOW (ref 8.9–10.3)
Chloride: 95 mmol/L — ABNORMAL LOW (ref 98–111)
Creatinine, Ser: 3.31 mg/dL — ABNORMAL HIGH (ref 0.44–1.00)
GFR, Estimated: 13 mL/min — ABNORMAL LOW (ref >60)
Glucose, Bld: 100 mg/dL — ABNORMAL HIGH (ref 70–99)
Potassium: 3.4 mmol/L — ABNORMAL LOW (ref 3.5–5.1)
Sodium: 134 mmol/L — ABNORMAL LOW (ref 135–145)

## 2022-01-25 LAB — GLUCOSE, CAPILLARY
Glucose-Capillary: 93 mg/dL (ref 70–99)
Glucose-Capillary: 113 mg/dL — ABNORMAL HIGH (ref 70–99)
Glucose-Capillary: 180 mg/dL — ABNORMAL HIGH (ref 70–99)
Glucose-Capillary: 164 mg/dL — ABNORMAL HIGH (ref 70–99)
Glucose-Capillary: 105 mg/dL — ABNORMAL HIGH (ref 70–99)

## 2022-01-25 MED ORDER — FUROSEMIDE 10 MG/ML IJ SOLN: 20.0000 mg | Freq: Once | INTRAMUSCULAR | Status: DC

## 2022-01-25 MED ORDER — FERROUS SULFATE 325 (65 FE) MG PO TABS
325.0000 mg | ORAL_TABLET | Freq: Two times a day (BID) | ORAL | Status: DC
  Administered 2022-01-25 – 2022-01-31 (×12): 325 mg via ORAL
  Filled 2022-01-25 (×12): qty 1

## 2022-01-25 MED ORDER — CEPHALEXIN 500 MG PO CAPS
500.0000 mg | ORAL_CAPSULE | ORAL | Status: AC
  Administered 2022-01-25 – 2022-01-28 (×4): 500 mg via ORAL
  Filled 2022-01-25 (×4): qty 1

## 2022-01-25 MED ORDER — ROPINIROLE HCL 1 MG PO TABS
0.5000 mg | ORAL_TABLET | Freq: Two times a day (BID) | ORAL | Status: DC
  Administered 2022-01-25 – 2022-01-31 (×11): 0.5 mg via ORAL
  Filled 2022-01-25 (×11): qty 1

## 2022-01-25 MED ORDER — HYDRALAZINE HCL 50 MG PO TABS
50.0000 mg | ORAL_TABLET | Freq: Three times a day (TID) | ORAL | Status: DC
  Administered 2022-01-25 – 2022-01-31 (×16): 50 mg via ORAL
  Filled 2022-01-25 (×16): qty 1

## 2022-01-25 MED ORDER — FUROSEMIDE 10 MG/ML IJ SOLN
40.0000 mg | Freq: Once | INTRAMUSCULAR | Status: AC
  Administered 2022-01-25: 40 mg via INTRAVENOUS
  Filled 2022-01-25: qty 4

## 2022-01-25 MED ORDER — FOLIC ACID 1 MG PO TABS
1.0000 mg | ORAL_TABLET | Freq: Every day | ORAL | Status: DC
  Administered 2022-01-25 – 2022-01-31 (×7): 1 mg via ORAL
  Filled 2022-01-25 (×7): qty 1

## 2022-01-25 NOTE — Progress Notes (Signed)
PROGRESS NOTE                                                                                                                                                                                                             Patient Demographics:    Kim Burns, is a 86 y.o. female, DOB - 05/10/1935, RSW:[id number]  Outpatient Primary MD for the patient is [name], MD    LOS - 3  Admit date - 01/21/2022    Chief Complaint  Patient presents with   Back Pain       Brief Narrative (HPI from H&P)   86 year old female history of renal cell mass status post nephrostomy tube, ureteral stent, ESRD on HD MWF PAF on Eliquis, chronic diastolic CHF nonobstructive CAD, previous CVA presents with severe back pain found to have subcapsular hematoma.   See was having intermittent hematuria x3 wk,returned from a trip from [place] 12/4 and started having severe diffuse back pain during night so came to the ED.   In the ED had abnormal EKG with subtle inferior ST elevation and ST depression in lateral lead EDP discussed with on-call cardiology Dr. Irish Lack patient generally clean cath in July 2023 with 20% stenosis of RCA-advised holding off on cath and continue to repeat EKG follow-up troponins> +/- heparin if significant bump in troponin.  Labs showed significant leukocytosis, elevated creatinine 6.4, troponin 25 > 28 hemoglobin 9.0> 9.5>7.,5 blood glucose 425, lactic acidosis 5.7> 5.4> 4.3.CT renal study showed large left renal subcapsular hematoma extending into the perirenal space left pigtail ureteral stent in place, no hydronephrosis, urology  Dr Cain Sieve was consulted> advised that she does  Kcentra was given to reverse Eliquis.  Patient was started on IV antibiotics blood cultures and also start gentle IV fluids and admitted.   Subjective:   Patient in bed, appears comfortable, denies any headache, no fever, no chest pain or pressure,  no shortness of breath , no abdominal pain. No new focal weakness.   Assessment  & Plan :    Large left renal subcapsular hematoma. Intermittent hematuria x3 weeks. S/p left tumor ablation c/b urine leak-refractory to management and with an indwelling stent and nephrostomy tube: Hemoglobin drifting down, has been on movement and exam. She is s/p 2 units of packed RBC transfusion, also received Kcentra on 01/22/2022, bleeding seems to  have held for now.  Seen by urology and IR.  CTA on 01/23/2022 shows no replete, per IR monitor.  Case discussed in detail with Dr. Louis Meckel patient's urologist on 01/24/2022, 01/25/2022, who wants to hold anticoagulation for at least 3 weeks thereafter cautious retrial of anticoagulation.  Currently no role for nephrectomy as the risk of procedure outweighs the benefits.  Continue to monitor CBC and provide supportive care for now.  Acute blood loss anemia, anemia of chronic renal disease:  see above.  Suspected Severe Sepsis POA-given patient's lactic acidosis/leukocytosis, possible intra-abdominal source. History of left perinephric abscess: Clinically stable blood cultures negative, sepsis pathophysiology has resolved, taper down antibiotics  ESRD on HD MWF: Nephrology following HD per schedule.  Off cycle while here next dialysis 01/26/2022  Previous CVA, PAF on Eliquis: Eliquis discontinued,but will need to d/w risks/benefits;given her stroke history she is high risk w/ CHA2DS2-VASc Score = 7,  this was explained to patient and patient's son [name].  Upon family request cardiology team consulted who agreed with our management and signed off on 01/23/2022, home dose amiodarone continued, Coreg resumed.  Chronic diastolic CHF, EF 40% on recent echocardiogram: Compensated now.  Hypertension - blood pressure is improved to reintroduce Coreg.  As needed hydralazine as well.  Type 2 diabetes mellitus with uncontrolled hyperglycemia, on long-term insulin: Continue SSI and  monitor   CBG (last 3)  Recent Labs    01/24/22 2330 01/25/22 0334 01/25/22 0753  GLUCAP 198* 93 113*        Condition - Extremely Guarded  Family Communication  :    [name] [phone]  - 01/23/22, 01/24/22  Code Status :  Full  Consults  :  Urology, PCCM , IR, Renal, Cardiology  PUD Prophylaxis : PPI   Procedures  :     CT - 1. Large left renal subcapsular hematoma extending into the perirenal space. 2. Similar positioning of the percutaneous drainage catheter and left pigtail ureteral stent. No hydronephrosis. 3. No bowel obstruction. 4. Partially visualized small left pleural effusion. 5.  Aortic Atherosclerosis.      Disposition Plan  :    Status is: Inpatient  DVT Prophylaxis  :    Place and maintain sequential compression device Start: 01/23/22 0601 SCDs Start: 01/22/22 0227    Lab Results  Component Value Date   PLT 194 01/25/2022    Diet :  Diet Order             DIET SOFT Room service appropriate? No; Fluid consistency: Thin; Fluid restriction: 1200 mL Fluid  Diet effective now                    Inpatient Medications  Scheduled Meds:  (feeding supplement) PROSource Plus  30 mL Oral BID BM   amiodarone  100 mg Oral Daily   carvedilol  3.125 mg Oral BID WC   cephALEXin  500 mg Oral Q24H   cycloSPORINE  1 drop Both Eyes BID   darbepoetin (ARANESP) injection - NON-DIALYSIS  100 mcg Subcutaneous Q Thu-1800   ferrous sulfate  325 mg Oral BID WC   folic acid  1 mg Oral Daily   furosemide  20 mg Intravenous Once   insulin aspart  0-9 Units Subcutaneous Q4H   lipase/protease/amylase  36,000 Units Oral BID WC   pantoprazole  40 mg Oral Daily   rOPINIRole  0.25 mg Oral QHS   senna  1 tablet Oral QHS   sevelamer carbonate  1,600  mg Oral TID WC   Continuous Infusions:   PRN Meds:.acetaminophen, dextrose, hydrALAZINE, HYDROmorphone (DILAUDID) injection, oxyCODONE, polyethylene glycol, prochlorperazine       Objective:   Vitals:    01/24/22 2000 01/24/22 2333 01/25/22 0338 01/25/22 0815  BP:  (!) 153/73 (!) 148/72 (!) 159/81  Pulse:  99 97 97  Resp:  '20 20 19  '$ Temp: 99.2 F (37.3 C) 98.1 F (36.7 C) 97.8 F (36.6 C) 98.1 F (36.7 C)  TempSrc: Axillary Oral Oral Oral  SpO2:  96% 94% 97%  Weight:      Height:        Wt Readings from Last 3 Encounters:  01/24/22 59.6 kg  11/23/21 58.8 kg  11/07/21 58.5 kg     Intake/Output Summary (Last 24 hours) at 01/25/2022 1102 Last data filed at 01/25/2022 0448 Gross per 24 hour  Intake 0 ml  Output 981 ml  Net -981 ml     Physical Exam  Awake Alert, No new F.N deficits, left external nephrostomy tube/drain in place [place].AT,PERRAL Supple Neck, No JVD,   Symmetrical Chest wall movement, Good air movement bilaterally, CTAB RRR,No Gallops, Rubs or new Murmurs,  +ve B.Sounds, Abd Soft, No tenderness,   No Cyanosis, Clubbing or edema      Data Review:    Recent Labs  Lab 01/21/22 2305 01/22/22 0017 01/22/22 0506 01/22/22 1243 01/22/22 1746 01/23/22 0422 01/23/22 1224 01/24/22 0736 01/25/22 0327  WBC 23.6*  --  20.1*  --   --  11.8* 11.7* 12.4* 10.7*  HGB 9.0*   < > 7.5*   < > 10.1* 8.4* 8.1* 8.4* 8.0*  HCT 29.5*   < > 25.5*   < > 30.8* 25.4* 24.0* 25.8* 24.1*  PLT 325  --  294  --   --  179 165 212 194  MCV 91.3  --  93.8  --   --  86.7 87.0 89.3 88.9  MCH 27.9  --  27.6  --   --  28.7 29.3 29.1 29.5  MCHC 30.5  --  29.4*  --   --  33.1 33.8 32.6 33.2  RDW 18.8*  --  19.0*  --   --  17.2* 17.6* 17.5* 17.5*  LYMPHSABS 2.7  --   --   --   --   --   --  1.2 0.8  MONOABS 0.6  --   --   --   --   --   --  0.6 0.6  EOSABS 0.1  --   --   --   --   --   --  0.0 0.1  BASOSABS 0.1  --   --   --   --   --   --  0.1 0.0   < > = values in this interval not displayed.    Recent Labs  Lab 01/21/22 2305 01/22/22 0001 01/22/22 0017 01/22/22 0038 01/22/22 0506 01/22/22 0545 01/22/22 0857 01/22/22 1243 01/22/22 1257 01/22/22 1745 01/23/22 0422  01/24/22 0736 01/25/22 0327  NA 136  --  136  --  138  --   --   --  138  --  137 135 134*  K 4.3  --  4.3  --  5.4*  --   --   --  4.8  --  3.7 4.2 3.4*  CL 104  --  105  --  105  --   --   --   --   --  96* 98 95*  CO2 19*  --   --   --  20*  --   --   --   --   --  '26 23 28  '$ GLUCOSE 378*  --  425*  --  379*  --   --   --   --   --  129* 154* 100*  BUN 59*  --  58*  --  62*  --   --   --   --   --  38* 50* 20  CREATININE 6.46*  --  7.00*  --  6.70*  --   --   --   --   --  4.70* 6.06* 3.31*  AST 37  --   --   --  28  --   --   --   --   --   --   --   --   ALT 52*  --   --   --  47*  --   --   --   --   --   --   --   --   ALKPHOS 55  --   --   --  45  --   --   --   --   --   --   --   --   BILITOT 0.4  --   --   --  0.4  --   --   --   --   --   --   --   --   ALBUMIN 2.4*  --   --   --  2.3*  --   --   --   --   --   --   --   --   LATICACIDVEN  --    < >  --  5.7*  --  4.3* 5.0* 3.5*  --  3.0* 1.5  --   --   INR  --   --   --  1.4*  --   --   --   --   --   --   --   --   --   HGBA1C  --   --   --   --  6.6*  --   --   --   --   --   --   --   --   MG  --   --   --   --  2.8*  --   --   --   --   --   --   --   --   CALCIUM 8.7*  --   --   --  9.0  --   --   --   --   --  9.0 8.6* 8.1*   < > = values in this interval not displayed.      Radiology Reports CT Angio Abd/Pel w/ and/or w/o  Result Date: 01/23/2022 CLINICAL DATA:  Acute left-sided subcapsular renal and perinephric hemorrhage. EXAM: CT ANGIOGRAPHY ABDOMEN AND PELVIS WITH CONTRAST AND WITHOUT CONTRAST TECHNIQUE: Multidetector CT imaging of the abdomen and pelvis was performed using the standard protocol during bolus administration of intravenous contrast. Multiplanar reconstructed images and MIPs were obtained and reviewed to evaluate the vascular anatomy. RADIATION DOSE REDUCTION: This exam was performed according to the departmental dose-optimization program which includes automated exposure control, adjustment of the mA  and/or kV according to patient size and/or use of  iterative reconstruction technique. CONTRAST:  8m OMNIPAQUE IOHEXOL 350 MG/ML SOLN COMPARISON:  Non-contrast CT of the abdomen and pelvis on 01/22/2022 FINDINGS: VASCULAR Aorta: Atherosclerosis of the abdominal aorta without evidence of aneurysm or dissection. Celiac: Normally patent. Normally patent branch vessels and branch vessel anatomy. SMA: Normally patent. Renals: Calcified plaque at origins of bilateral single renal arteries without significant stenosis. IMA: Normally patent. Inflow: Normally patent bilateral iliac arteries without aneurysm. Proximal Outflow: Normally patent bilateral common femoral arteries and femoral bifurcations. Closure device ring on top of the right common femoral artery without mass effect or pseudoaneurysm. Veins: No venous abnormalities on venous phase imaging. Review of the MIP images confirms the above findings. NON-VASCULAR Lower chest: Small left pleural effusion and mild left basilar atelectasis. Hepatobiliary: No focal liver abnormality is seen. Status post cholecystectomy. No biliary dilatation. Pancreas: Status post Whipple procedure and pancreatectomy. Spleen: Normal in size without focal abnormality. Adrenals/Urinary Tract: Stable left posterior subcapsular and perinephric hematoma measuring approximately 9 cm in greatest diameter. This is stable when measuring at the same level on the prior unenhanced CT. No evidence of active bleeding on arterial or venous phases of imaging. No arterial pseudoaneurysm identified. Underlying left kidney demonstrates atrophy and some degree of underlying hydronephrosis. A ureteral stent is present extending from the collecting system into the bladder. Additional percutaneous drainage catheter remains present just inferior to the lower pole collecting system without fluid collection. Stomach/Bowel: No evidence of bowel obstruction, ileus or free intraperitoneal air. Lymphatic: No enlarged  abdominal or pelvic lymph nodes. Reproductive: Status post hysterectomy. No adnexal masses. Other: No abdominal wall hernia or abnormality. No abdominopelvic ascites. Musculoskeletal: Stable degenerative disc disease of the lumbar spine. Stable osteopenia. No bony lesions or fractures. IMPRESSION: 1. Stable left posterior subcapsular and perinephric hematoma measuring approximately 9 cm in greatest diameter. No evidence of active bleeding on arterial or venous phases of imaging. No arterial pseudoaneurysm identified. 2. Underlying left renal atrophy and some degree of underlying hydronephrosis. A ureteral stent is present extending from the collecting system into the bladder. Additional percutaneous drainage catheter remains present just inferior to the lower pole collecting system. 3. Small left pleural effusion and mild left basilar atelectasis. 4. Aortic atherosclerosis. Aortic Atherosclerosis (ICD10-I70.0). Electronically Signed   By: GAletta EdouardM.D.   On: 01/23/2022 12:29   CT Renal Stone Study  Result Date: 01/22/2022 CLINICAL DATA:  Abdominal flank pain. EXAM: CT ABDOMEN AND PELVIS WITHOUT CONTRAST TECHNIQUE: Multidetector CT imaging of the abdomen and pelvis was performed following the standard protocol without IV contrast. RADIATION DOSE REDUCTION: This exam was performed according to the departmental dose-optimization program which includes automated exposure control, adjustment of the mA and/or kV according to patient size and/or use of iterative reconstruction technique. COMPARISON:  CT abdomen pelvis dated 11/12/2021. FINDINGS: Evaluation of this exam is limited in the absence of intravenous contrast. Lower chest: Partially visualized small left pleural effusion. There is coronary vascular calcification. No intra-abdominal free air or free fluid. Hepatobiliary: The liver is unremarkable. There is mild biliary dilatation, post cholecystectomy. Pancreas: The pancreas is atrophic and poorly  visualized. Spleen: The spleen is unremarkable. Adrenals/Urinary Tract: The right adrenal glands unremarkable. The left adrenal gland is not visualized. There has been interval development of a large left renal subcapsular hematoma extending into the perirenal space. The hematoma measures approximately 6.2 x 8.9 cm in greatest axial dimensions and 14 cm in craniocaudal length. There is mass effect and compression of the left renal parenchyma. Please note  evaluation for possible active bleed is limited on this noncontrast CT. Similar positioning of the percutaneous drainage catheter and left pigtail ureteral stent. No hydronephrosis. Moderate right renal atrophy with several cysts and additional smaller hypodense lesions which are not characterized. There is no hydronephrosis on the right. The right ureter and urinary bladder appear unremarkable. Stomach/Bowel: Postsurgical changes of the distal stomach. The stomach is distended. High attenuating focus within the distal stomach likely ingested content. There is no bowel obstruction. The appendix is not visualized with certainty. No inflammatory changes identified in the right lower quadrant. Vascular/Lymphatic: Advanced aortoiliac atherosclerotic disease. The IVC is unremarkable. No portal venous gas. No obvious adenopathy. Reproductive: Hysterectomy. Other: None Musculoskeletal: Osteopenia with degenerative changes of the spine. No acute osseous pathology. IMPRESSION: 1. Large left renal subcapsular hematoma extending into the perirenal space. 2. Similar positioning of the percutaneous drainage catheter and left pigtail ureteral stent. No hydronephrosis. 3. No bowel obstruction. 4. Partially visualized small left pleural effusion. 5.  Aortic Atherosclerosis (ICD10-I70.0). These results were called by telephone at the time of interpretation on 01/22/2022 at 12:59 am to provider [facility] , who verbally acknowledged these results. Electronically Signed   By: Anner Crete M.D.   On: 01/22/2022 01:05   DG Chest Port 1 View  Result Date: 01/22/2022 CLINICAL DATA:  Possible sepsis. EXAM: PORTABLE CHEST 1 VIEW COMPARISON:  10/29/2021. FINDINGS: The heart is enlarged and the mediastinal contour is within normal limits. Atherosclerotic calcification of the aorta is noted. Lung volumes are low with atelectasis at the lung bases. No definite effusion or pneumothorax. Surgical clips are noted in the cervical soft tissues on the right. No acute osseous abnormality. IMPRESSION: 1. Low lung volumes with atelectasis at the lung bases. 2. Cardiomegaly. Electronically Signed   By: Brett Fairy M.D.   On: 01/22/2022 00:30      Signature  -   Lala Lund M.D on 01/25/2022 at 11:02 AM   -  To page go to [email or web address]

## 2022-01-25 NOTE — Care Management Important Message (Signed)
Important Message  Patient Details  Name: Kim Burns MRN: [id number] Date of Birth: 1935-04-22   Medicare Important Message Given:  Yes     Kim Burns Montine Circle 01/25/2022, 3:38 PM

## 2022-01-25 NOTE — Progress Notes (Signed)
[facility] Progress Note   Subjective:   Seen in room - no overnight issues, L abd/flank pain stable. No CP/dyspnea. Looks like plan is for discharge tomorrow, then considering embolizing L kidney in the near future once she is stronger.  Objective Vitals:   01/24/22 2333 01/25/22 0338 01/25/22 0815 01/25/22 1101  BP: (!) 153/73 (!) 148/72 (!) 159/81   Pulse: 99 97 97   Resp: '20 20 19   '$ Temp: 98.1 F (36.7 C) 97.8 F (36.6 C) 98.1 F (36.7 C)   TempSrc: Oral Oral Oral   SpO2: 96% 94% 97% 95%  Weight:      Height:       Physical Exam General: Frail woman, NAD. [place] O2 in place Heart: RRR; 2/6 murmur Lungs: CTAB anteriorly Abdomen: soft, L flank JP drain with clear urine present Extremities: no LE edema Dialysis Access: LUE AVF + bruit  Additional Objective Labs: Basic Metabolic Panel: Recent Labs  Lab 01/22/22 0506 01/22/22 1257 01/23/22 0422 01/24/22 0736 01/25/22 0327  NA 138   < > 137 135 134*  K 5.4*   < > 3.7 4.2 3.4*  CL 105  --  96* 98 95*  CO2 20*  --  '26 23 28  '$ GLUCOSE 379*  --  129* 154* 100*  BUN 62*  --  38* 50* 20  CREATININE 6.70*  --  4.70* 6.06* 3.31*  CALCIUM 9.0  --  9.0 8.6* 8.1*  PHOS 6.8*  --   --   --   --    < > = values in this interval not displayed.   Liver Function Tests: Recent Labs  Lab 01/21/22 2305 01/22/22 0506  AST 37 28  ALT 52* 47*  ALKPHOS 55 45  BILITOT 0.4 0.4  PROT 5.6* 5.2*  ALBUMIN 2.4* 2.3*   CBC: Recent Labs  Lab 01/21/22 2305 01/22/22 0017 01/22/22 0506 01/22/22 1243 01/23/22 0422 01/23/22 1224 01/24/22 0736 01/25/22 0327  WBC 23.6*  --  20.1*  --  11.8* 11.7* 12.4* 10.7*  NEUTROABS 19.8*  --   --   --   --   --  10.5* 9.2*  HGB 9.0*   < > 7.5*   < > 8.4* 8.1* 8.4* 8.0*  HCT 29.5*   < > 25.5*   < > 25.4* 24.0* 25.8* 24.1*  MCV 91.3  --  93.8  --  86.7 87.0 89.3 88.9  PLT 325  --  294  --  179 165 212 194   < > = values in this interval not displayed.   Medications:   (feeding  supplement) PROSource Plus  30 mL Oral BID BM   amiodarone  100 mg Oral Daily   carvedilol  3.125 mg Oral BID WC   cephALEXin  500 mg Oral Q24H   cycloSPORINE  1 drop Both Eyes BID   darbepoetin (ARANESP) injection - NON-DIALYSIS  100 mcg Subcutaneous Q Thu-1800   ferrous sulfate  325 mg Oral BID WC   folic acid  1 mg Oral Daily   furosemide  40 mg Intravenous Once   hydrALAZINE  50 mg Oral Q8H   insulin aspart  0-9 Units Subcutaneous Q4H   lipase/protease/amylase  36,000 Units Oral BID WC   pantoprazole  40 mg Oral Daily   rOPINIRole  0.25 mg Oral QHS   senna  1 tablet Oral QHS   sevelamer carbonate  1,600 mg Oral TID WC    Dialysis Orders: MWF at [facility]  3:45hr, 350/A1.5, EDW 58kg, 2K/2Ca, AVF, 16g needles, no heparin - Calcitriol 1.81mg PO q HD - No recent ESA - was on hold for Hgb 12.9 on 01/16/22 - Binders: Renvela 2/meals.   Assessment/Plan:  L renal hematoma: In setting of prior urinoma s/p RCC ablation. Hematoma is not communicating with current JP drain as her bag is not bloody.  Urology and IR involved, no further treatment at this time. Considering embolizing the L kidney in the future. Sepsis: WBC and LA very high on admit, started on IV Cefepime - both improved, will finish course PO Keflex.  ESRD: Usual MWF schedule - off schedule this week - dialyzed Tues/Thus. For HD tomorrow AM, then discharge and resume usual outpatient HD schedule next Monday.  Hypertension/volume: BP back up, for UF with next HD.  Anemia of ESRD + ABLA: Hgb dropped from 12.9 last week ->7.5 on admit. 2U PRBCs given 12/5. Eliquis on hold. Hgb now stablizing 8-8.4 range. Aranesp 1074m given 12/7.  Metabolic bone disease: CorrCa high, Phos high. Hold VDRA for now, resumed home binders.  Nutrition:  Alb low, continue supplements.  T2DM  A-fib: Home Eliquis on hold -> plan to stay off for at least 3 weeks then re-eval per notes.  KaVeneta PentonPA-C 01/25/2022, 12:27 PM  [facility]

## 2022-01-25 NOTE — Progress Notes (Signed)
Advised pt is for possible d/c tomorrow. Pt is off schedule and due HD tomorrow. Contacted staff to inquire if pt would be appropriate for out-pt HD tomorrow at clinic. It was felt to be in the pt's best interest to receive HD here and then d/c if pt was stable. Will contact [facility] to advise clinic of pt's possible d/c this weekend and for possible resumption on Monday.   Melven Sartorius Renal Navigator [phone]

## 2022-01-25 NOTE — Progress Notes (Signed)
Occupational Therapy Treatment Patient Details Name: Kim Burns MRN: [id number] DOB: 11-06-1935 Today's Date: 01/25/2022   History of present illness Patient is an 86 year old female presents with severe back pain found to have subcapsular hematoma. See was having intermittent hematuria x3 wk,returned from a trip from [place] 12/4 and started having severe diffuse back pain during night so came to the ED.PMH significant for renal cell mass status post nephrostomy tube, ureteral stent, ESRD on HD MWF PAF on Eliquis, chronic diastolic CHF nonobstructive CAD, previous CVA.   OT comments  Patient making progress with OT treatment with Patient able to doff and donn socks without assistance and able to perform mobility and transfers with min guard assist. Patient stated following mobility that she felt ill but not nauseous or dizzy. Patient continues to be appropriate for discharge home with [facility] and family assistance. Acute OT to continue to follow.    Recommendations for follow up therapy are one component of a multi-disciplinary discharge planning process, led by the attending physician.  Recommendations may be updated based on patient status, additional functional criteria and insurance authorization.    Follow Up Recommendations  Home health OT     Assistance Recommended at Discharge Intermittent Supervision/Assistance  Patient can return home with the following  A little help with walking and/or transfers;A little help with bathing/dressing/bathroom;Assistance with cooking/housework;Help with stairs or ramp for entrance   Equipment Recommendations  None recommended by OT    Recommendations for Other Services      Precautions / Restrictions Precautions Precautions: Fall Restrictions Weight Bearing Restrictions: No       Mobility Bed Mobility Overal bed mobility: Needs Assistance             General bed mobility comments: OOB in chair upon arrival     Transfers Overall transfer level: Needs assistance Equipment used: Rolling walker (2 wheels) Transfers: Sit to/from Stand Sit to Stand: Min guard           General transfer comment: min guard for safety     Balance Overall balance assessment: Needs assistance Sitting-balance support: Feet supported Sitting balance-Leahy Scale: Fair     Standing balance support: During functional activity, Bilateral upper extremity supported, Reliant on assistive device for balance Standing balance-Leahy Scale: Poor Standing balance comment: reliant on device for support                           ADL either performed or assessed with clinical judgement   ADL Overall ADL's : Needs assistance/impaired                 Upper Body Dressing : Supervision/safety;Sitting Upper Body Dressing Details (indicate cue type and reason): to donn gown to cover back Lower Body Dressing: Supervision/safety;Sitting/lateral leans Lower Body Dressing Details (indicate cue type and reason): changed socks Toilet Transfer: Min guard;Rolling walker (2 wheels) Toilet Transfer Details (indicate cue type and reason): simulated to recliner           General ADL Comments: Patient stated she felt sick after mobility    Extremity/Trunk Assessment              Vision       Perception     Praxis      Cognition Arousal/Alertness: Awake/alert Behavior During Therapy: WFL for tasks assessed/performed Overall Cognitive Status: Within Functional Limits for tasks assessed  Exercises      Shoulder Instructions       General Comments HR 91, SpO2 97 on RA, BP 153/78    Pertinent Vitals/ Pain       Pain Assessment Pain Assessment: Faces Faces Pain Scale: Hurts a little bit Pain Location: headache Pain Descriptors / Indicators: Headache Pain Intervention(s): Monitored during session  Home Living                                           Prior Functioning/Environment              Frequency  Min 2X/week        Progress Toward Goals  OT Goals(current goals can now be found in the care plan section)  Progress towards OT goals: Progressing toward goals  Acute Rehab OT Goals Patient Stated Goal: get better OT Goal Formulation: With patient Time For Goal Achievement: 02/06/22 Potential to Achieve Goals: Good ADL Goals Pt Will Perform Upper Body Dressing: with modified independence;sitting Pt Will Perform Lower Body Dressing: with modified independence;sitting/lateral leans;sit to/from stand Pt Will Transfer to Toilet: with modified independence;ambulating;regular height toilet Pt Will Perform Tub/Shower Transfer: Tub transfer;Shower transfer;with modified independence;ambulating;rolling walker;shower seat  Plan Discharge plan remains appropriate    Co-evaluation                 AM-PAC OT "6 Clicks" Daily Activity     Outcome Measure   Help from another person eating meals?: None Help from another person taking care of personal grooming?: A Little Help from another person toileting, which includes using toliet, bedpan, or urinal?: A Little Help from another person bathing (including washing, rinsing, drying)?: A Lot Help from another person to put on and taking off regular upper body clothing?: A Little Help from another person to put on and taking off regular lower body clothing?: A Little 6 Click Score: 18    End of Session Equipment Utilized During Treatment: Rolling walker (2 wheels)  OT Visit Diagnosis: Unsteadiness on feet (R26.81);Other abnormalities of gait and mobility (R26.89);Muscle weakness (generalized) (M62.81)   Activity Tolerance Patient tolerated treatment well   Patient Left in chair;with call bell/phone within reach;with chair alarm set   Nurse Communication Mobility status;Other (comment) (patient stating she felt ill after mobility)         Time: 1142-1207 OT Time Calculation (min): 25 min  Charges: OT General Charges $OT Visit: 1 Visit OT Treatments $Self Care/Home Management : 8-22 mins $Therapeutic Activity: 8-22 mins  Lodema Hong, OTA Acute Rehabilitation Services  Office [phone]   Trixie Dredge 01/25/2022, 1:53 PM

## 2022-01-25 NOTE — Progress Notes (Signed)
Mobility Specialist Progress Note:   01/25/22 1542  Mobility  Activity Ambulated with assistance in hallway  Level of Assistance Contact guard assist, steadying assist  Assistive Device Front wheel walker  Distance Ambulated (ft) 150 ft  Activity Response Tolerated well  Mobility Referral Yes  $Mobility charge 1 Mobility   Pt received in bed and agreeable. C/o 10/10 leg pain, RN aware. Pt left in chair with all needs met, call bell in reach, and chair alarm on.   Andrey Campanile Mobility Specialist Please contact via SecureChat or  Rehab office at [phone]

## 2022-01-25 NOTE — Progress Notes (Signed)
Urology Inpatient Progress Report  Hyperglycemia [R73.9] Hematoma of left kidney [S37.012A] Hematoma of left kidney, initial encounter [B71.696V] Left-sided thoracic back pain, unspecified chronicity [M54.6]     Intv/Subj: No acute events overnight. Patient is without complaint.   Principal Problem:   Hematoma of left kidney  Current Facility-Administered Medications  Medication Dose Route Frequency Provider Last Rate Last Admin   (feeding supplement) PROSource Plus liquid 30 mL  30 mL Oral BID BM Loren Racer, PA-C   30 mL at 01/24/22 1026   acetaminophen (TYLENOL) tablet 650 mg  650 mg Oral Q6H PRN Irene Pap N, DO       amiodarone (PACERONE) tablet 100 mg  100 mg Oral Daily Lala Lund K, MD   100 mg at 01/25/22 0815   carvedilol (COREG) tablet 3.125 mg  3.125 mg Oral BID WC Thurnell Lose, MD   3.125 mg at 01/25/22 0815   cephALEXin (KEFLEX) capsule 500 mg  500 mg Oral Q24H Thurnell Lose, MD       cycloSPORINE (RESTASIS) 0.05 % ophthalmic emulsion 1 drop  1 drop Both Eyes BID Thurnell Lose, MD   1 drop at 01/25/22 8938   Darbepoetin Alfa (ARANESP) injection 100 mcg  100 mcg Subcutaneous Q Thu-1800 Loren Racer, PA-C   100 mcg at 01/24/22 2037   dextrose 50 % solution 50 mL  1 ampule Intravenous Q1H PRN Howerter, Justin B, DO       ferrous sulfate tablet 325 mg  325 mg Oral BID WC Thurnell Lose, MD   325 mg at 12/04/49 0258   folic acid (FOLVITE) tablet 1 mg  1 mg Oral Daily Thurnell Lose, MD   1 mg at 01/25/22 0815   hydrALAZINE (APRESOLINE) injection 10 mg  10 mg Intravenous Q6H PRN Thurnell Lose, MD       HYDROmorphone (DILAUDID) injection 0.5 mg  0.5 mg Intravenous Q4H PRN Irene Pap N, DO   0.5 mg at 01/22/22 0902   insulin aspart (novoLOG) injection 0-9 Units  0-9 Units Subcutaneous Q4H Howerter, Justin B, DO   2 Units at 01/24/22 2332   lipase/protease/amylase (CREON) capsule 36,000 Units  36,000 Units Oral BID WC Thurnell Lose,  MD   36,000 Units at 01/25/22 0815   oxyCODONE (Oxy IR/ROXICODONE) immediate release tablet 5 mg  5 mg Oral Q6H PRN Irene Pap N, DO   5 mg at 01/24/22 2325   pantoprazole (PROTONIX) EC tablet 40 mg  40 mg Oral Daily Thurnell Lose, MD   40 mg at 01/25/22 0815   polyethylene glycol (MIRALAX / GLYCOLAX) packet 17 g  17 g Oral Daily PRN Irene Pap N, DO   17 g at 01/23/22 0841   prochlorperazine (COMPAZINE) injection 5 mg  5 mg Intravenous Q6H PRN Irene Pap N, DO       rOPINIRole (REQUIP) tablet 0.25 mg  0.25 mg Oral QHS Lala Lund K, MD   0.25 mg at 01/24/22 2043   senna (SENOKOT) tablet 8.6 mg  1 tablet Oral QHS Irene Pap N, DO   8.6 mg at 01/24/22 2046   sevelamer carbonate (RENVELA) tablet 1,600 mg  1,600 mg Oral TID WC Loren Racer, PA-C   1,600 mg at 01/25/22 0814     Objective: Vital: Vitals:   01/24/22 2000 01/24/22 2333 01/25/22 0338 01/25/22 0815  BP:  (!) 153/73 (!) 148/72 (!) 159/81  Pulse:  99 97 97  Resp:  20 20  19  Temp: 99.2 F (37.3 C) 98.1 F (36.7 C) 97.8 F (36.6 C) 98.1 F (36.7 C)  TempSrc: Axillary Oral Oral Oral  SpO2:  96% 94% 97%  Weight:      Height:       I/Os: I/O last 3 completed shifts: In: 120 [P.O.:120] Out: 981 [Drains:980; Other:1]  Physical Exam:  General: Patient is in no apparent distress - mild shortness of breath Lungs: Normal respiratory effort, chest expands symmetrically. GI:  The abdomen is soft and nontender without mass. Scant thickened efflux from perinephric drain Ext: lower extremities symmetric  Lab Results: Recent Labs    01/23/22 1224 01/24/22 0736 01/25/22 0327  WBC 11.7* 12.4* 10.7*  HGB 8.1* 8.4* 8.0*  HCT 24.0* 25.8* 24.1*   Recent Labs    01/23/22 0422 01/24/22 0736 01/25/22 0327  NA 137 135 134*  K 3.7 4.2 3.4*  CL 96* 98 95*  CO2 '26 23 28  '$ GLUCOSE 129* 154* 100*  BUN 38* 50* 20  CREATININE 4.70* 6.06* 3.31*  CALCIUM 9.0 8.6* 8.1*   No results for input(s): "LABPT", "INR" in  the last 72 hours. No results for input(s): "LABURIN" in the last 72 hours. Results for orders placed or performed during the hospital encounter of 01/21/22  Blood culture (routine x 2)     Status: None (Preliminary result)   Collection Time: 01/21/22 12:01 AM   Specimen: BLOOD  Result Value Ref Range Status   Specimen Description BLOOD RIGHT ANTECUBITAL  Final   Special Requests   Final    BOTTLES DRAWN AEROBIC AND ANAEROBIC Blood Culture adequate volume   Culture   Final    NO GROWTH 3 DAYS Performed at [facility], [place] [address], [place], [place] [address]    Report Status PENDING  Incomplete  Blood culture (routine x 2)     Status: None (Preliminary result)   Collection Time: 01/22/22 12:38 AM   Specimen: BLOOD  Result Value Ref Range Status   Specimen Description BLOOD RIGHT ANTECUBITAL  Final   Special Requests   Final    BOTTLES DRAWN AEROBIC AND ANAEROBIC Blood Culture results may not be optimal due to an inadequate volume of blood received in culture bottles   Culture   Final    NO GROWTH 3 DAYS Performed at [facility], [place] [address], [place], [place] [address]    Report Status PENDING  Incomplete  Resp Panel by RT-PCR (Flu A&B, Covid) Anterior Nasal Swab     Status: None   Collection Time: 01/22/22  1:10 AM   Specimen: Anterior Nasal Swab  Result Value Ref Range Status   SARS Coronavirus 2 by RT PCR NEGATIVE NEGATIVE Final    Comment: (NOTE) SARS-CoV-2 target nucleic acids are NOT DETECTED.  The SARS-CoV-2 RNA is generally detectable in upper respiratory specimens during the acute phase of infection. The lowest concentration of SARS-CoV-2 viral copies this assay can detect is 138 copies/mL. A negative result does not preclude SARS-Cov-2 infection and should not be used as the sole basis for treatment or other patient management decisions. A negative result may occur with  improper specimen collection/handling, submission of specimen other than  nasopharyngeal swab, presence of viral mutation(s) within the areas targeted by this assay, and inadequate number of viral copies(<138 copies/mL). A negative result must be combined with clinical observations, patient history, and epidemiological information. The expected result is Negative.  Fact Sheet for Patients:  EntrepreneurPulse.com.au  Fact Sheet for  Healthcare Providers:  IncredibleEmployment.be  This test is no t yet approved or cleared by the [place] and  has been authorized for detection and/or diagnosis of SARS-CoV-2 by FDA under an Emergency Use Authorization (EUA). This EUA will remain  in effect (meaning this test can be used) for the duration of the COVID-19 declaration under Section 564(b)(1) of the Act, 21 U.S.C.section 360bbb-3(b)(1), unless the authorization is terminated  or revoked sooner.       Influenza A by PCR NEGATIVE NEGATIVE Final   Influenza B by PCR NEGATIVE NEGATIVE Final    Comment: (NOTE) The Xpert Xpress SARS-CoV-2/FLU/RSV plus assay is intended as an aid in the diagnosis of influenza from Nasopharyngeal swab specimens and should not be used as a sole basis for treatment. Nasal washings and aspirates are unacceptable for Xpert Xpress SARS-CoV-2/FLU/RSV testing.  Fact Sheet for Patients: EntrepreneurPulse.com.au  Fact Sheet for Healthcare Providers: IncredibleEmployment.be  This test is not yet approved or cleared by the [place] FDA and has been authorized for detection and/or diagnosis of SARS-CoV-2 by FDA under an Emergency Use Authorization (EUA). This EUA will remain in effect (meaning this test can be used) for the duration of the COVID-19 declaration under Section 564(b)(1) of the Act, 21 U.S.C. section 360bbb-3(b)(1), unless the authorization is terminated or revoked.  Performed at [facility], [place] [address] [place], [place] [address]      Studies/Results: CT Angio Abd/Pel w/ and/or w/o  Result Date: 01/23/2022 CLINICAL DATA:  Acute left-sided subcapsular renal and perinephric hemorrhage. EXAM: CT ANGIOGRAPHY ABDOMEN AND PELVIS WITH CONTRAST AND WITHOUT CONTRAST TECHNIQUE: Multidetector CT imaging of the abdomen and pelvis was performed using the standard protocol during bolus administration of intravenous contrast. Multiplanar reconstructed images and MIPs were obtained and reviewed to evaluate the vascular anatomy. RADIATION DOSE REDUCTION: This exam was performed according to the departmental dose-optimization program which includes automated exposure control, adjustment of the mA and/or kV according to patient size and/or use of iterative reconstruction technique. CONTRAST:  56m OMNIPAQUE IOHEXOL 350 MG/ML SOLN COMPARISON:  Non-contrast CT of the abdomen and pelvis on 01/22/2022 FINDINGS: VASCULAR Aorta: Atherosclerosis of the abdominal aorta without evidence of aneurysm or dissection. Celiac: Normally patent. Normally patent branch vessels and branch vessel anatomy. SMA: Normally patent. Renals: Calcified plaque at origins of bilateral single renal arteries without significant stenosis. IMA: Normally patent. Inflow: Normally patent bilateral iliac arteries without aneurysm. Proximal Outflow: Normally patent bilateral common femoral arteries and femoral bifurcations. Closure device ring on top of the right common femoral artery without mass effect or pseudoaneurysm. Veins: No venous abnormalities on venous phase imaging. Review of the MIP images confirms the above findings. NON-VASCULAR Lower chest: Small left pleural effusion and mild left basilar atelectasis. Hepatobiliary: No focal liver abnormality is seen. Status post cholecystectomy. No biliary dilatation. Pancreas: Status post Whipple procedure and pancreatectomy. Spleen: Normal in size without focal abnormality. Adrenals/Urinary Tract: Stable left posterior subcapsular and  perinephric hematoma measuring approximately 9 cm in greatest diameter. This is stable when measuring at the same level on the prior unenhanced CT. No evidence of active bleeding on arterial or venous phases of imaging. No arterial pseudoaneurysm identified. Underlying left kidney demonstrates atrophy and some degree of underlying hydronephrosis. A ureteral stent is present extending from the collecting system into the bladder. Additional percutaneous drainage catheter remains present just inferior to the lower pole collecting system without fluid collection. Stomach/Bowel: No evidence of bowel obstruction, ileus or free intraperitoneal air. Lymphatic:  No enlarged abdominal or pelvic lymph nodes. Reproductive: Status post hysterectomy. No adnexal masses. Other: No abdominal wall hernia or abnormality. No abdominopelvic ascites. Musculoskeletal: Stable degenerative disc disease of the lumbar spine. Stable osteopenia. No bony lesions or fractures. IMPRESSION: 1. Stable left posterior subcapsular and perinephric hematoma measuring approximately 9 cm in greatest diameter. No evidence of active bleeding on arterial or venous phases of imaging. No arterial pseudoaneurysm identified. 2. Underlying left renal atrophy and some degree of underlying hydronephrosis. A ureteral stent is present extending from the collecting system into the bladder. Additional percutaneous drainage catheter remains present just inferior to the lower pole collecting system. 3. Small left pleural effusion and mild left basilar atelectasis. 4. Aortic atherosclerosis. Aortic Atherosclerosis (ICD10-I70.0). Electronically Signed   By: Aletta Edouard M.D.   On: 01/23/2022 12:29    Assessment: Perinephric hemorrhage - resolved Collecting system injury  - chronic, managed with stent and peri-nephric drain ESRD - on hemodialysis Deconditioned  Plan: Recommend no further treatment for her kidney at this point.  I would recommend that she recover  from this hospitalization, and gain strength.  Embolization of the kidney would be a big thing for her to recover from - she'd come through it better if she was stronger and in better shape then she currently is.  We also discussed the risks of infection post-infarct which we'd have to accept in order for her to ultimately get rid of the drain and minimize the risk for re-bleeding from her kidney.   Will plan to leave off eliquis for the short term.  Would recommend that they f/u with IR and discuss renal artery infarction in the future.  Discussed this with patient and sons.   Louis Meckel, MD Urology 01/25/2022, 10:16 AM

## 2022-01-25 NOTE — Plan of Care (Signed)
  Problem: Education: Goal: Ability to describe self-care measures that may prevent or decrease complications (Diabetes Survival Skills Education) will improve Outcome: Progressing   Problem: Coping: Goal: Ability to adjust to condition or change in health will improve Outcome: Progressing   Problem: Fluid Volume: Goal: Ability to maintain a balanced intake and output will improve Outcome: Progressing   Problem: Nutritional: Goal: Maintenance of adequate nutrition will improve Outcome: Progressing   Problem: Skin Integrity: Goal: Risk for impaired skin integrity will decrease Outcome: Progressing   Problem: Education: Goal: Knowledge of General Education information will improve Description: Including pain rating scale, medication(s)/side effects and non-pharmacologic comfort measures Outcome: Progressing

## 2022-01-26 DIAGNOSIS — S37012A Minor contusion of left kidney, initial encounter: Secondary | ICD-10-CM | POA: Diagnosis not present

## 2022-01-26 LAB — BASIC METABOLIC PANEL
Anion gap: 13 (ref 5–15)
BUN: 25 mg/dL — ABNORMAL HIGH (ref 8–23)
CO2: 26 mmol/L (ref 22–32)
Calcium: 8.4 mg/dL — ABNORMAL LOW (ref 8.9–10.3)
Chloride: 95 mmol/L — ABNORMAL LOW (ref 98–111)
Creatinine, Ser: 4.42 mg/dL — ABNORMAL HIGH (ref 0.44–1.00)
GFR, Estimated: 9 mL/min — ABNORMAL LOW (ref >60)
Glucose, Bld: 142 mg/dL — ABNORMAL HIGH (ref 70–99)
Potassium: 3.6 mmol/L (ref 3.5–5.1)
Sodium: 134 mmol/L — ABNORMAL LOW (ref 135–145)

## 2022-01-26 LAB — CBC WITH DIFFERENTIAL/PLATELET
Abs Immature Granulocytes: 0.05 10*3/uL (ref 0.00–0.07)
Basophils Absolute: 0 10*3/uL (ref 0.0–0.1)
Basophils Relative: 0 %
Eosinophils Absolute: 0.2 10*3/uL (ref 0.0–0.5)
Eosinophils Relative: 3 %
HCT: 25 % — ABNORMAL LOW (ref 36.0–46.0)
Hemoglobin: 8.3 g/dL — ABNORMAL LOW (ref 12.0–15.0)
Immature Granulocytes: 1 %
Lymphocytes Relative: 10 %
Lymphs Abs: 0.9 10*3/uL (ref 0.7–4.0)
MCH: 29.6 pg (ref 26.0–34.0)
MCHC: 33.2 g/dL (ref 30.0–36.0)
MCV: 89.3 fL (ref 80.0–100.0)
Monocytes Absolute: 0.6 10*3/uL (ref 0.1–1.0)
Monocytes Relative: 7 %
Neutro Abs: 7 10*3/uL (ref 1.7–7.7)
Neutrophils Relative %: 79 %
Platelets: 241 10*3/uL (ref 150–400)
RBC: 2.8 MIL/uL — ABNORMAL LOW (ref 3.87–5.11)
RDW: 17.2 % — ABNORMAL HIGH (ref 11.5–15.5)
WBC: 8.9 10*3/uL (ref 4.0–10.5)
nRBC: 0 % (ref 0.0–0.2)

## 2022-01-26 LAB — GLUCOSE, CAPILLARY
Glucose-Capillary: 101 mg/dL — ABNORMAL HIGH (ref 70–99)
Glucose-Capillary: 113 mg/dL — ABNORMAL HIGH (ref 70–99)
Glucose-Capillary: 121 mg/dL — ABNORMAL HIGH (ref 70–99)
Glucose-Capillary: 129 mg/dL — ABNORMAL HIGH (ref 70–99)
Glucose-Capillary: 170 mg/dL — ABNORMAL HIGH (ref 70–99)

## 2022-01-26 MED ORDER — INSULIN ASPART 100 UNIT/ML IJ SOLN
0.0000 [IU] | Freq: Three times a day (TID) | INTRAMUSCULAR | Status: DC
  Administered 2022-01-27 – 2022-01-28 (×2): 3 [IU] via SUBCUTANEOUS

## 2022-01-26 MED ORDER — HYDRALAZINE HCL 20 MG/ML IJ SOLN
INTRAMUSCULAR | Status: AC
  Administered 2022-01-26: 20 mg
  Filled 2022-01-26: qty 1

## 2022-01-26 NOTE — TOC Progression Note (Signed)
Transition of Care [facility]) - Progression Note    Patient Details  Name: Kim Burns MRN: [id number] Date of Birth: 1936-01-04  Transition of Care [facility]) CM/SW Contact  Carles Collet, RN Phone Number: 01/26/2022, 8:46 AM  Clinical Narrative:     Damaris Schooner w sons over the pone to discuss concerns with discharge. They informed me that the patient has many stairs to get into the home and a two level house with bedroom upstairs. They state that she was nausiated and very weak after OT session and mobility session yesterday and are concerned about her living alone and navigating stairs throughout the day and to get to HD. They would like patient to have a short stay at SNF prior to returning home. Provided them with medicare.gov resource to start looking at possible SNF options.  Requested PT to reassess with stairs today so SNF workup and insurance auth could be started asap.  Updated MD RN PT Mobility tech and SW  Expected Discharge Plan: [place] Barriers to Discharge: Continued Medical Work up  Expected Discharge Plan and Services Expected Discharge Plan: [place]   Discharge Planning Services: CM Consult                                 [facility] Agency: [place] Date [place]: 01/23/22 Time [place]: [phone] Representative spoke with at [place]: [place] ([place]) Interventions    Readmission Risk Interventions    08/27/2021   10:16 AM  Readmission Risk Prevention Plan  Transportation Screening Complete  PCP or Specialist Appt within 3-5 Days Complete  HRI or [place] Complete  Social Work Consult for [place] Planning/Counseling Complete  Palliative Care Screening Not Applicable  Medication Review Press photographer) Complete

## 2022-01-26 NOTE — Discharge Instructions (Signed)
Follow with Primary MD [name], MD in 7 days, also follow-up with your IR MD and urologist within a week.  Left nephrostomy drain care as before unchanged.  Get CBC, CMP, 2 view Chest X ray -  checked next visit with your primary MD or SNF MD   Activity: As tolerated with Full fall precautions use walker/cane & assistance as needed  Disposition Home    Diet: Renal diet with 1.2 L fluid restriction per day.  Special Instructions: If you have smoked or chewed Tobacco  in the last 2 yrs please stop smoking, stop any regular Alcohol  and or any Recreational drug use.  On your next visit with your primary care physician please Get Medicines reviewed and adjusted.  Please request your Prim.MD to go over all Hospital Tests and Procedure/Radiological results at the follow up, please get all Hospital records sent to your Prim MD by signing hospital release before you go home.  If you experience worsening of your admission symptoms, develop shortness of breath, life threatening emergency, suicidal or homicidal thoughts you must seek medical attention immediately by calling 911 or calling your MD immediately  if symptoms less severe.  You Must read complete instructions/literature along with all the possible adverse reactions/side effects for all the Medicines you take and that have been prescribed to you. Take any new Medicines after you have completely understood and accpet all the possible adverse reactions/side effects.

## 2022-01-26 NOTE — Progress Notes (Signed)
PROGRESS NOTE                                                                                                                                                                                                             Patient Demographics:    Kim Burns, is a 86 y.o. female, DOB - 12-Aug-1935, ZTI:[id number]  Outpatient Primary MD for the patient is Kim Bien, MD    LOS - 4  Admit date - 01/21/2022    Chief Complaint  Patient presents with   Back Pain       Brief Narrative (HPI from H&P)   86 year old female history of renal cell mass status post nephrostomy tube, ureteral stent, ESRD on HD MWF PAF on Eliquis, chronic diastolic CHF nonobstructive CAD, previous CVA presents with severe back pain found to have subcapsular hematoma.   See was having intermittent hematuria x3 wk,returned from a trip from [place] 12/4 and started having severe diffuse back pain during night so came to the ED.   In the ED had abnormal EKG with subtle inferior ST elevation and ST depression in lateral lead EDP discussed with on-call cardiology Kim Burns patient generally clean cath in July 2023 with 20% stenosis of RCA-advised holding off on cath and continue to repeat EKG follow-up troponins> +/- heparin if significant bump in troponin.  Labs showed significant leukocytosis, elevated creatinine 6.4, troponin 25 > 28 hemoglobin 9.0> 9.5>7.,5 blood glucose 425, lactic acidosis 5.7> 5.4> 4.3.CT renal study showed large left renal subcapsular hematoma extending into the perirenal space left pigtail ureteral stent in place, no hydronephrosis, urology  Kim Burns was consulted> advised that she does  Kcentra was given to reverse Eliquis.  Patient was started on IV antibiotics blood cultures and also start gentle IV fluids and admitted.   Subjective:   Patient in bed, appears comfortable, denies any headache, no fever, no chest pain or pressure,  no shortness of breath , no abdominal pain. No new focal weakness.   Assessment  & Plan :   Large left renal subcapsular hematoma. Intermittent hematuria x3 weeks. S/p left tumor ablation c/b urine leak-refractory to management and with an indwelling stent and nephrostomy tube: Hemoglobin drifting down, has been on movement and exam. She is s/p 2 units of packed RBC transfusion, also received Kcentra on 01/22/2022, bleeding seems to have  held for now.  Seen by urology and IR.  CTA on 01/23/2022 shows no replete, per IR monitor.  Case discussed in detail with Kim Burns patient's urologist on 01/24/2022, 01/25/2022, who wants to hold anticoagulation for at least 3 weeks thereafter cautious retrial of anticoagulation.  Currently no role for nephrectomy as the risk of procedure outweighs the benefits.  Please CBC trending up, left-sided flank pain better, no signs of ongoing active bleeding, stable for discharge, patient and family deciding on disposition in conjunction with TOC team.  Acute blood loss anemia, anemia of chronic renal disease:  see above.  Suspected Severe Sepsis POA-given patient's lactic acidosis/leukocytosis, possible intra-abdominal source. History of left perinephric abscess: Clinically stable blood cultures negative, sepsis pathophysiology has resolved, tapered down antibiotics on 01/25/2022.  ESRD on HD MWF: Nephrology following HD per schedule.  Off cycle while here next dialysis 01/26/2022  Previous CVA, PAF on Eliquis: Eliquis discontinued,but will need to d/w risks/benefits;given her stroke history she is high risk w/ CHA2DS2-VASc Score = 7,  this was explained to patient and patient's son Kim Burns.  Upon family request cardiology team consulted who agreed with our management and signed off on 01/23/2022, home dose amiodarone continued, Coreg resumed.  Chronic diastolic CHF, EF 74% on recent echocardiogram: Compensated now.  Hypertension - blood pressure is improved to reintroduced  Coreg.  As needed hydralazine as well.  Type 2 diabetes mellitus with uncontrolled hyperglycemia, on long-term insulin: Continue SSI and monitor   CBG (last 3)  Recent Labs    01/25/22 2010 01/26/22 0049 01/26/22 0402  GLUCAP 105* 101* 129*        Condition - Extremely Guarded  Family Communication  :    Kim Burns [phone]  - 01/23/22, 01/24/22, 01/26/2022  Code Status :  Full  Consults  :  Urology, PCCM , IR, Renal, Cardiology  PUD Prophylaxis : PPI   Procedures  :     CT - 1. Large left renal subcapsular hematoma extending into the perirenal space. 2. Similar positioning of the percutaneous drainage catheter and left pigtail ureteral stent. No hydronephrosis. 3. No bowel obstruction. 4. Partially visualized small left pleural effusion. 5.  Aortic Atherosclerosis.      Disposition Plan  :    Status is: Inpatient  DVT Prophylaxis  :    Place and maintain sequential compression device Start: 01/23/22 0601 SCDs Start: 01/22/22 0227    Lab Results  Component Value Date   PLT 241 01/26/2022    Diet :  Diet Order             Diet regular Room service appropriate? Yes; Fluid consistency: Thin  Diet effective now                    Inpatient Medications  Scheduled Meds:  (feeding supplement) PROSource Plus  30 mL Oral BID BM   amiodarone  100 mg Oral Daily   carvedilol  3.125 mg Oral BID WC   cephALEXin  500 mg Oral Q24H   cycloSPORINE  1 drop Both Eyes BID   darbepoetin (ARANESP) injection - NON-DIALYSIS  100 mcg Subcutaneous Q Thu-1800   ferrous sulfate  325 mg Oral BID WC   folic acid  1 mg Oral Daily   hydrALAZINE  50 mg Oral Q8H   insulin aspart  0-9 Units Subcutaneous TID WC   lipase/protease/amylase  36,000 Units Oral BID WC   pantoprazole  40 mg Oral Daily   rOPINIRole  0.5 mg  Oral BID   senna  1 tablet Oral QHS   sevelamer carbonate  1,600 mg Oral TID WC   Continuous Infusions:   PRN Meds:.acetaminophen, dextrose, hydrALAZINE,  HYDROmorphone (DILAUDID) injection, oxyCODONE, polyethylene glycol, prochlorperazine       Objective:   Vitals:   01/26/22 0305 01/26/22 0723 01/26/22 0756 01/26/22 0806  BP: (!) 157/85 (!) 152/73 (!) 144/72 (!) 147/69  Pulse: 94  96 94  Resp: '20 14 15 '$ (!) 32  Temp: 98.1 F (36.7 C) 98.7 F (37.1 C) 97.9 F (36.6 C)   TempSrc: Oral Oral Oral   SpO2: 96%  96% 99%  Weight:   59.7 kg   Height:        Wt Readings from Last 3 Encounters:  01/26/22 59.7 kg  11/23/21 58.8 kg  11/07/21 58.5 kg     Intake/Output Summary (Last 24 hours) at 01/26/2022 0828 Last data filed at 01/26/2022 0400 Gross per 24 hour  Intake --  Output 675 ml  Net -675 ml     Physical Exam  Awake Alert, No new F.N deficits, left external nephrostomy tube/drain in place [place].AT,PERRAL Supple Neck, No JVD,   Symmetrical Chest wall movement, Good air movement bilaterally, CTAB RRR,No Gallops, Rubs or new Murmurs,  +ve B.Sounds, Abd Soft, No tenderness,   No edema        Data Review:    Recent Labs  Lab 01/21/22 2305 01/22/22 0017 01/23/22 0422 01/23/22 1224 01/24/22 0736 01/25/22 0327 01/26/22 0230  WBC 23.6*   < > 11.8* 11.7* 12.4* 10.7* 8.9  HGB 9.0*   < > 8.4* 8.1* 8.4* 8.0* 8.3*  HCT 29.5*   < > 25.4* 24.0* 25.8* 24.1* 25.0*  PLT 325   < > 179 165 212 194 241  MCV 91.3   < > 86.7 87.0 89.3 88.9 89.3  MCH 27.9   < > 28.7 29.3 29.1 29.5 29.6  MCHC 30.5   < > 33.1 33.8 32.6 33.2 33.2  RDW 18.8*   < > 17.2* 17.6* 17.5* 17.5* 17.2*  LYMPHSABS 2.7  --   --   --  1.2 0.8 0.9  MONOABS 0.6  --   --   --  0.6 0.6 0.6  EOSABS 0.1  --   --   --  0.0 0.1 0.2  BASOSABS 0.1  --   --   --  0.1 0.0 0.0   < > = values in this interval not displayed.    Recent Labs  Lab 01/21/22 2305 01/22/22 0001 01/22/22 0038 01/22/22 0506 01/22/22 0545 01/22/22 0857 01/22/22 1243 01/22/22 1257 01/22/22 1745 01/23/22 0422 01/24/22 0736 01/25/22 0327 01/26/22 0230  NA 136   < >  --  138  --   --    --  138  --  137 135 134* 134*  K 4.3   < >  --  5.4*  --   --   --  4.8  --  3.7 4.2 3.4* 3.6  CL 104   < >  --  105  --   --   --   --   --  96* 98 95* 95*  CO2 19*  --   --  20*  --   --   --   --   --  '26 23 28 26  '$ GLUCOSE 378*   < >  --  379*  --   --   --   --   --  129*  154* 100* 142*  BUN 59*   < >  --  62*  --   --   --   --   --  38* 50* 20 25*  CREATININE 6.46*   < >  --  6.70*  --   --   --   --   --  4.70* 6.06* 3.31* 4.42*  AST 37  --   --  28  --   --   --   --   --   --   --   --   --   ALT 52*  --   --  47*  --   --   --   --   --   --   --   --   --   ALKPHOS 55  --   --  45  --   --   --   --   --   --   --   --   --   BILITOT 0.4  --   --  0.4  --   --   --   --   --   --   --   --   --   ALBUMIN 2.4*  --   --  2.3*  --   --   --   --   --   --   --   --   --   LATICACIDVEN  --    < > 5.7*  --  4.3* 5.0* 3.5*  --  3.0* 1.5  --   --   --   INR  --   --  1.4*  --   --   --   --   --   --   --   --   --   --   HGBA1C  --   --   --  6.6*  --   --   --   --   --   --   --   --   --   MG  --   --   --  2.8*  --   --   --   --   --   --   --   --   --   CALCIUM 8.7*  --   --  9.0  --   --   --   --   --  9.0 8.6* 8.1* 8.4*   < > = values in this interval not displayed.      Radiology Reports CT Angio Abd/Pel w/ and/or w/o  Result Date: 01/23/2022 CLINICAL DATA:  Acute left-sided subcapsular renal and perinephric hemorrhage. EXAM: CT ANGIOGRAPHY ABDOMEN AND PELVIS WITH CONTRAST AND WITHOUT CONTRAST TECHNIQUE: Multidetector CT imaging of the abdomen and pelvis was performed using the standard protocol during bolus administration of intravenous contrast. Multiplanar reconstructed images and MIPs were obtained and reviewed to evaluate the vascular anatomy. RADIATION DOSE REDUCTION: This exam was performed according to the departmental dose-optimization program which includes automated exposure control, adjustment of the mA and/or kV according to patient size and/or use of iterative  reconstruction technique. CONTRAST:  24m OMNIPAQUE IOHEXOL 350 MG/ML SOLN COMPARISON:  Non-contrast CT of the abdomen and pelvis on 01/22/2022 FINDINGS: VASCULAR Aorta: Atherosclerosis of the abdominal aorta without evidence of aneurysm or dissection. Celiac: Normally patent. Normally patent branch vessels and branch vessel anatomy. SMA: Normally patent. Renals: Calcified plaque at origins of bilateral single renal arteries  without significant stenosis. IMA: Normally patent. Inflow: Normally patent bilateral iliac arteries without aneurysm. Proximal Outflow: Normally patent bilateral common femoral arteries and femoral bifurcations. Closure device ring on top of the right common femoral artery without mass effect or pseudoaneurysm. Veins: No venous abnormalities on venous phase imaging. Review of the MIP images confirms the above findings. NON-VASCULAR Lower chest: Small left pleural effusion and mild left basilar atelectasis. Hepatobiliary: No focal liver abnormality is seen. Status post cholecystectomy. No biliary dilatation. Pancreas: Status post Whipple procedure and pancreatectomy. Spleen: Normal in size without focal abnormality. Adrenals/Urinary Tract: Stable left posterior subcapsular and perinephric hematoma measuring approximately 9 cm in greatest diameter. This is stable when measuring at the same level on the prior unenhanced CT. No evidence of active bleeding on arterial or venous phases of imaging. No arterial pseudoaneurysm identified. Underlying left kidney demonstrates atrophy and some degree of underlying hydronephrosis. A ureteral stent is present extending from the collecting system into the bladder. Additional percutaneous drainage catheter remains present just inferior to the lower pole collecting system without fluid collection. Stomach/Bowel: No evidence of bowel obstruction, ileus or free intraperitoneal air. Lymphatic: No enlarged abdominal or pelvic lymph nodes. Reproductive: Status post  hysterectomy. No adnexal masses. Other: No abdominal wall hernia or abnormality. No abdominopelvic ascites. Musculoskeletal: Stable degenerative disc disease of the lumbar spine. Stable osteopenia. No bony lesions or fractures. IMPRESSION: 1. Stable left posterior subcapsular and perinephric hematoma measuring approximately 9 cm in greatest diameter. No evidence of active bleeding on arterial or venous phases of imaging. No arterial pseudoaneurysm identified. 2. Underlying left renal atrophy and some degree of underlying hydronephrosis. A ureteral stent is present extending from the collecting system into the bladder. Additional percutaneous drainage catheter remains present just inferior to the lower pole collecting system. 3. Small left pleural effusion and mild left basilar atelectasis. 4. Aortic atherosclerosis. Aortic Atherosclerosis (ICD10-I70.0). Electronically Signed   By: Aletta Edouard M.D.   On: 01/23/2022 12:29      Signature  -   Lala Lund M.D on 01/26/2022 at 8:28 AM   -  To page go to [email or web address]

## 2022-01-26 NOTE — Progress Notes (Signed)
   01/26/22 1212  Pain Assessment  Pain Scale 0-10  Pain Score 0  Pain Intervention(s) Medication (See eMAR)  Fistula / Graft Left Upper arm Arteriovenous fistula  Placement Date/Time: 04/24/17 1031   Placed prior to admission: No  Orientation: Left  Access Location: Upper arm  Access Type: (c) Arteriovenous fistula  Site Condition No complications  Status Accessed;Flushed  Needle Size 15g  Drainage Description None  Neurological  Level of Consciousness Alert  Orientation Level Oriented X4  Respiratory  Respiratory Pattern Regular;Unlabored  Chest Assessment Chest expansion symmetrical  Bilateral Breath Sounds Clear;Diminished  Cardiac  Cardiac Rhythm NSR   Received patient in bed to unit.  Alert and oriented.  Informed consent signed and in chart.   Treatment initiated: 0807 Treatment completed: 1141 Patient tolerated well.  Transported back to the room  Alert, without acute distress.  Hand-off given to patient's nurse.   Access used: Yes Access issues: No  Total UF removed: 2000 Medication(s) given: Oxycodone 5 mg, hydralazine20 mg IV Post HD VS: 98.3, 20, 84, 127/72, O2 100 R/A Post HD weight: 57.9kg   [facility]

## 2022-01-26 NOTE — Progress Notes (Signed)
Physical Therapy Treatment Patient Details Name: RAELEE Burns MRN: [id number] DOB: [birth year] Today's Date: 01/26/2022   History of Present Illness Patient is an 86 year old female presents with severe back pain found to have subcapsular hematoma. See was having intermittent hematuria x3 wk,returned from a trip from [place] 12/4 and started having severe diffuse back pain during night so came to the ED.PMH significant for renal cell mass status post nephrostomy tube, ureteral stent, ESRD on HD MWF PAF on Eliquis, chronic diastolic CHF nonobstructive CAD, previous CVA.    PT Comments    Patient feeling weak, nauseated, dizzy and with headache. Agreed to assess BP in supine, sitting, and standing. Patient was unable to stand long enough to get standing BP due to dizziness. Patient felt too weak and sick to attempt ambulation and returned to supine. Based on current status (and recent notes with pt having 13 steps to enter home and 13 steps up to second level bedroom), discharge recommendation updated to SNF.   Orthostatic BPs  Supine 115/63  Sitting 129/66  Standing Unable to stand long enough  Sitting at EOB 122/60      Recommendations for follow up therapy are one component of a multi-disciplinary discharge planning process, led by the attending physician.  Recommendations may be updated based on patient status, additional functional criteria and insurance authorization.  Follow Up Recommendations  Skilled nursing-short term rehab (<3 hours/day) Can patient physically be transported by private vehicle: No   Assistance Recommended at Discharge Frequent or constant Supervision/Assistance  Patient can return home with the following A little help with walking and/or transfers;A little help with bathing/dressing/bathroom;Assistance with cooking/housework;Direct supervision/assist for medications management;Assist for transportation;Help with stairs or ramp for entrance   Equipment  Recommendations  None recommended by PT    Recommendations for Other Services       Precautions / Restrictions Precautions Precautions: Fall Restrictions Weight Bearing Restrictions: No     Mobility  Bed Mobility Overal bed mobility: Needs Assistance Bed Mobility: Supine to Sit, Sit to Supine     Supine to sit: Min assist, HOB elevated Sit to supine: Min guard   General bed mobility comments: moving  very slowly due to not feeling well    Transfers Overall transfer level: Needs assistance Equipment used: None Transfers: Sit to/from Stand Sit to Stand: Min assist           General transfer comment: steadying assist    Ambulation/Gait               General Gait Details: pt unable due to dizziness, nausea   Stairs             Wheelchair Mobility    Modified Rankin (Stroke Patients Only)       Balance Overall balance assessment: Needs assistance Sitting-balance support: Feet supported Sitting balance-Leahy Scale: Fair     Standing balance support: During functional activity, Single extremity supported Standing balance-Leahy Scale: Poor Standing balance comment: reliant on UE support                            Cognition Arousal/Alertness: Lethargic Behavior During Therapy: Flat affect Overall Cognitive Status: Within Functional Limits for tasks assessed                                 General Comments: pt generally not feeling well (dizzy, nauseated, headache)  Exercises      General Comments General comments (skin integrity, edema, etc.): see vital flowsheet for BP      Pertinent Vitals/Pain Pain Assessment Pain Assessment: Faces Faces Pain Scale: Hurts even more Pain Location: headache Pain Descriptors / Indicators: Headache Pain Intervention(s): Limited activity within patient's tolerance, Monitored during session    Home Living                          Prior Function             PT Goals (current goals can now be found in the care plan section) Acute Rehab PT Goals Patient Stated Goal: get stronger and eventually home PT Goal Formulation: With patient Time For Goal Achievement: 02/06/22 Potential to Achieve Goals: Good Progress towards PT goals: Not progressing toward goals - comment    Frequency    Min 2X/week      PT Plan Discharge plan needs to be updated;Frequency needs to be updated    Co-evaluation              AM-PAC PT "6 Clicks" Mobility   Outcome Measure  Help needed turning from your back to your side while in a flat bed without using bedrails?: A Little Help needed moving from lying on your back to sitting on the side of a flat bed without using bedrails?: A Little Help needed moving to and from a bed to a chair (including a wheelchair)?: A Little Help needed standing up from a chair using your arms (e.g., wheelchair or bedside chair)?: A Little Help needed to walk in hospital room?: Total Help needed climbing 3-5 steps with a railing? : Total 6 Click Score: 14    End of Session Equipment Utilized During Treatment: Gait belt Activity Tolerance: Patient limited by lethargy;Treatment limited secondary to medical complications (Comment) (dizziness, nausea) Patient left: with call bell/phone within reach;in bed;with bed alarm set Nurse Communication: Mobility status;Other (comment) (HA, dizzy, nauseated; couldn't stand for orthostatic BPs) PT Visit Diagnosis: Muscle weakness (generalized) (M62.81);Difficulty in walking, not elsewhere classified (R26.2);Unsteadiness on feet (R26.81)     Time: 9735-3299 PT Time Calculation (min) (ACUTE ONLY): 13 min  Charges:  $Therapeutic Activity: 8-22 mins                      Arby Barrette, PT Acute Rehabilitation Services  Office [phone]    Rexanne Mano 01/26/2022, 3:03 PM

## 2022-01-26 NOTE — Progress Notes (Signed)
[facility] Progress Note   Subjective:  Seen on HD - 2L UFG and tolerating. Plan was for discharge today, but now she is thinking that she may need short-term SNF, case manager aware and working on this. Denies CP/dyspnea.  Objective Vitals:   01/26/22 0830 01/26/22 0900 01/26/22 0909 01/26/22 0932  BP: 132/73 (!) 145/63 (!) 182/63 (!) 182/63  Pulse:   76   Resp:   17   Temp:      TempSrc:      SpO2:   100%   Weight:      Height:       Physical Exam General: Frail woman, NAD. [place] O2 in place Heart: RRR; 2/6 murmur Lungs: CTAB anteriorly Abdomen: soft, L flank JP drain with clear urine present Extremities: no LE edema Dialysis Access: LUE AVF + bruit  Additional Objective Labs: Basic Metabolic Panel: Recent Labs  Lab 01/22/22 0506 01/22/22 1257 01/24/22 0736 01/25/22 0327 01/26/22 0230  NA 138   < > 135 134* 134*  K 5.4*   < > 4.2 3.4* 3.6  CL 105   < > 98 95* 95*  CO2 20*   < > '23 28 26  '$ GLUCOSE 379*   < > 154* 100* 142*  BUN 62*   < > 50* 20 25*  CREATININE 6.70*   < > 6.06* 3.31* 4.42*  CALCIUM 9.0   < > 8.6* 8.1* 8.4*  PHOS 6.8*  --   --   --   --    < > = values in this interval not displayed.   Liver Function Tests: Recent Labs  Lab 01/21/22 2305 01/22/22 0506  AST 37 28  ALT 52* 47*  ALKPHOS 55 45  BILITOT 0.4 0.4  PROT 5.6* 5.2*  ALBUMIN 2.4* 2.3*   CBC: Recent Labs  Lab 01/23/22 0422 01/23/22 1224 01/24/22 0736 01/25/22 0327 01/26/22 0230  WBC 11.8* 11.7* 12.4* 10.7* 8.9  NEUTROABS  --   --  10.5* 9.2* 7.0  HGB 8.4* 8.1* 8.4* 8.0* 8.3*  HCT 25.4* 24.0* 25.8* 24.1* 25.0*  MCV 86.7 87.0 89.3 88.9 89.3  PLT 179 165 212 194 241   Medications:   (feeding supplement) PROSource Plus  30 mL Oral BID BM   amiodarone  100 mg Oral Daily   carvedilol  3.125 mg Oral BID WC   cephALEXin  500 mg Oral Q24H   cycloSPORINE  1 drop Both Eyes BID   darbepoetin (ARANESP) injection - NON-DIALYSIS  100 mcg Subcutaneous Q Thu-1800    ferrous sulfate  325 mg Oral BID WC   folic acid  1 mg Oral Daily   hydrALAZINE  50 mg Oral Q8H   insulin aspart  0-9 Units Subcutaneous TID WC   lipase/protease/amylase  36,000 Units Oral BID WC   pantoprazole  40 mg Oral Daily   rOPINIRole  0.5 mg Oral BID   senna  1 tablet Oral QHS   sevelamer carbonate  1,600 mg Oral TID WC    Dialysis Orders: MWF at [facility] 3:45hr, 350/A1.5, EDW 58kg, 2K/2Ca, AVF, 16g needles, no heparin - Calcitriol 1.31mg PO q HD - No recent ESA - was on hold for Hgb 12.9 on 01/16/22 - Binders: [place]vela 2/meals.   Assessment/Plan:  L renal hematoma: In setting of prior urinoma s/p RCC ablation. Hematoma is not communicating with current JP drain as her bag is not bloody.  Urology and IR involved, no further treatment at this time. Considering embolizing the  L kidney in the future. Sepsis: WBC and LA very high on admit, started on IV Cefepime - both improved, will finish course PO Keflex.  ESRD: Usual MWF schedule - off schedule this week TTS. HD now - 2L UFG. Next HD [facility] 12/11.  Hypertension/volume: BP back up, for UF with next HD.  Anemia of ESRD + ABLA: Hgb dropped from 12.9 last week ->7.5 on admit. 2U PRBCs given 12/5. Eliquis on hold. Hgb now stablizing 8-8.4 range. Aranesp 147mg given 12/7.  Metabolic bone disease: CorrCa high, Phos high. Hold VDRA for now, resumed home binders.  Nutrition:  Alb low, continue supplements.  T2DM  A-fib: Home Eliquis on hold -> plan to stay off for at least 3 weeks then re-eval per notes.  KVeneta Penton PA-C 01/26/2022, 9:59 AM  [employer]

## 2022-01-26 NOTE — Progress Notes (Signed)
PT Cancellation Note  Patient Details Name: Kim Burns MRN: [id number] DOB: [birth year]   Cancelled Treatment:    Reason Eval/Treat Not Completed: (P) Patient at procedure or test/unavailable (Pt off unit for dialysis will f/u per POC.)   Edoardo Laforte J Stann Mainland 01/26/2022, 11:05 AM  Erasmo Leventhal , PTA [place] Office [phone]

## 2022-01-26 NOTE — NC FL2 (Signed)
[place] LEVEL OF CARE FORM     IDENTIFICATION  Patient Name: Kim Burns Birthdate: May 25, 1935 Sex: female Admission Date (Current Location): 01/21/2022  [facility] and [place] Number:  Herbalist and Address:  [facility], [place] [address] [place], [place] [address]      Provider Number: [id number]  Attending Physician Name and Address:  Thurnell Lose, MD  Relative Name and Phone Number:  Ev[place]elynn, [place] [phone]) [phone] [facility])    Current Level of Care: SNF Recommended Level of Care: [place] Prior Approval Number:    Date Approved/Denied:   PASRR Number: [id number]  Discharge Plan: SNF    Current Diagnoses: Patient Active Problem List   Diagnosis Date Noted   Hematoma of left kidney 01/22/2022   Kidney, perinephric abscess 11/02/2021   Bacteremia due to Streptococcus 11/01/2021   Lactic acidosis 10/30/2021   Fever 10/30/2021   Severe sepsis ([place]) 10/29/2021   Hydronephrosis of left kidney 10/29/2021   Chronic diastolic CHF (congestive heart failure) ([place]) 10/29/2021   Coronary artery disease involving native coronary artery of native heart without angina pectoris 10/29/2021   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 ([place]) 10/29/2021   Chest pain of uncertain etiology    Pulmonary nodule 08/26/2021   Paroxysmal atrial fibrillation (HCC) 08/26/2021   ESRD (end stage renal disease) on dialysis ([place]) 08/25/2021   Renal cell carcinoma of left kidney ([place]) 08/22/2021   Diabetes mellitus type 2, controlled ([place]) 11/14/2016   Hypokalemia 11/12/2016   Melena    Symptomatic anemia    Acute renal failure (ARF) (HCC) 10/03/2016   GERD (gastroesophageal reflux disease) 04/29/2016   Hyperglycemia 04/29/2016   IPMN (intraductal papillary mucinous neoplasm) 04/23/2016   History of stroke 04/16/2016   Sepsis ([place]) 06/18/2013   Localized swelling, mass and lump, neck 03/29/2013   Sinus  bradycardia 09/18/2012   Fatigue 09/18/2012    Orientation RESPIRATION BLADDER Height & Weight     Self, Time, Situation, Place  Normal Continent Weight: 127 lb 10.3 oz (57.9 kg) Height:  '5\' 2"'$  (157.5 cm)  BEHAVIORAL SYMPTOMS/MOOD NEUROLOGICAL BOWEL NUTRITION STATUS      Continent Diet  AMBULATORY STATUS COMMUNICATION OF NEEDS Skin   Extensive Assist Verbally Normal                       Personal Care Assistance Level of Assistance  Bathing, Feeding, Dressing Bathing Assistance: Limited assistance Feeding assistance: Independent Dressing Assistance: Limited assistance     Functional Limitations Info  Sight, Hearing, Speech Sight Info: Adequate Hearing Info: Adequate Speech Info: Adequate    SPECIAL CARE FACTORS FREQUENCY  PT (By licensed PT), OT (By licensed OT)     PT Frequency: 5x per week OT Frequency: 5x per week            Contractures Contractures Info: Not present    Additional Factors Info  Code Status, Allergies Code Status Info: full code Allergies Info: lotemax, vibra-tab, amolodipine, hydrochodone, lisinipril, verapamil           Current Medications (01/26/2022):  This is the current hospital active medication list Current Facility-Administered Medications  Medication Dose Route Frequency Provider Last Rate Last Admin   (feeding supplement) PROSource Plus liquid 30 mL  30 mL Oral BID BM Loren Racer, PA-C   30 mL at 01/24/22 1026   acetaminophen (TYLENOL) tablet 650 mg  650 mg Oral Q6H PRN Irene Pap N, DO   650  mg at 01/25/22 1103   amiodarone (PACERONE) tablet 100 mg  100 mg Oral Daily Lala Lund K, MD   100 mg at 01/26/22 1332   carvedilol (COREG) tablet 3.125 mg  3.125 mg Oral BID WC Thurnell Lose, MD   3.125 mg at 01/25/22 1655   cephALEXin (KEFLEX) capsule 500 mg  500 mg Oral Q24H Thurnell Lose, MD   500 mg at 01/25/22 2059   cycloSPORINE (RESTASIS) 0.05 % ophthalmic emulsion 1 drop  1 drop Both Eyes BID Thurnell Lose, MD   1 drop at 01/25/22 2100   Darbepoetin Alfa (ARANESP) injection 100 mcg  100 mcg Subcutaneous Q Thu-1800 Loren Racer, PA-C   100 mcg at 01/24/22 2037   dextrose 50 % solution 50 mL  1 ampule Intravenous Q1H PRN Howerter, Justin B, DO       ferrous sulfate tablet 325 mg  325 mg Oral BID WC Thurnell Lose, MD   325 mg at 05/39/76 7341   folic acid (FOLVITE) tablet 1 mg  1 mg Oral Daily Lala Lund K, MD   1 mg at 01/26/22 1332   hydrALAZINE (APRESOLINE) injection 10 mg  10 mg Intravenous Q6H PRN Thurnell Lose, MD       hydrALAZINE (APRESOLINE) tablet 50 mg  50 mg Oral Q8H Thurnell Lose, MD   50 mg at 01/25/22 2100   HYDROmorphone (DILAUDID) injection 0.5 mg  0.5 mg Intravenous Q4H PRN Irene Pap N, DO   0.5 mg at 01/22/22 0902   insulin aspart (novoLOG) injection 0-9 Units  0-9 Units Subcutaneous TID WC Thurnell Lose, MD       lipase/protease/amylase (CREON) capsule 36,000 Units  36,000 Units Oral BID WC Thurnell Lose, MD   36,000 Units at 01/25/22 1655   oxyCODONE (Oxy IR/ROXICODONE) immediate release tablet 5 mg  5 mg Oral Q6H PRN Irene Pap N, DO   5 mg at 01/26/22 1002   pantoprazole (PROTONIX) EC tablet 40 mg  40 mg Oral Daily Thurnell Lose, MD   40 mg at 01/26/22 1331   polyethylene glycol (MIRALAX / GLYCOLAX) packet 17 g  17 g Oral Daily PRN Irene Pap N, DO   17 g at 01/23/22 0841   prochlorperazine (COMPAZINE) injection 5 mg  5 mg Intravenous Q6H PRN Irene Pap N, DO   5 mg at 01/25/22 1215   rOPINIRole (REQUIP) tablet 0.5 mg  0.5 mg Oral BID Thurnell Lose, MD   0.5 mg at 01/25/22 1405   senna (SENOKOT) tablet 8.6 mg  1 tablet Oral QHS Irene Pap N, DO   8.6 mg at 01/25/22 2100   sevelamer carbonate (RENVELA) tablet 1,600 mg  1,600 mg Oral TID WC Loren Racer, PA-C   1,600 mg at 01/26/22 1332     Discharge Medications: Please see discharge summary for a list of discharge medications.  Relevant Imaging  Results:  Relevant Lab Results:   Additional Information SS# [id number]  [name], [place]

## 2022-01-26 NOTE — Progress Notes (Signed)
Mobility Specialist Progress Note:   01/26/22 1346  Mobility  Activity Dangled on edge of bed  Level of Assistance Standby assist, set-up cues, supervision of patient - no hands on  Assistive Device None  Activity Response Tolerated fair  Mobility Referral Yes  $Mobility charge 1 Mobility   Pt received in bed and agreeable. C/o fatigue from HD. Pt deferred further mobility d/t fatigue. Pt left in bed with all needs met and call bell in reach.   Andrey Campanile Mobility Specialist Please contact via SecureChat or  Rehab office at [phone]

## 2022-01-26 NOTE — TOC Progression Note (Signed)
Transition of Care [facility]) - Progression Note    Patient Details  Name: KARIANN WECKER MRN: [id number] Date of Birth: 1935/03/17  Transition [facility]) CM/SW Contact  Angad Nabers, [place], [place] Phone Number: 01/26/2022, 3:40 PM  Clinical Narrative:     CSW informed that patient now has recommendation for SNF. This Education officer, museum contacted patient's son's, they are agreeable to SNF and have a preference for [facility] in [place] as their first choice and [facility] as their second. They would like to start with those two options only before widening the search. SNF authorization process discussed, all questions answered.   TOC to continue to follow for discharge needs.     Sasha Rogel, LCSW Transition of Care    Expected Discharge Plan: [place] Barriers to Discharge: Continued Medical Work up  Expected Discharge Plan and Services Expected Discharge Plan: [place]   Discharge Planning Services: CM Consult                                 [facility] Agency: [place] Date [place]: 01/23/22 Time [place]: [phone] Representative spoke with at [place]: [place] ([place]) Interventions    Readmission Risk Interventions    08/27/2021   10:16 AM  Readmission Risk Prevention Plan  Transportation Screening Complete  PCP or Specialist Appt within 3-5 Days Complete  HRI or [place] Complete  Social Work Consult for [place] Planning/Counseling Complete  Palliative Care Screening Not Applicable  Medication Review Press photographer) Complete

## 2022-01-26 NOTE — Progress Notes (Signed)
Occupational Therapy Treatment Patient Details Name: Kim Burns MRN: [id number] DOB: October 03, 1935 Today's Date: 01/26/2022   History of present illness Patient is an 86 year old female presents with severe back pain found to have subcapsular hematoma. See was having intermittent hematuria x3 wk,returned from a trip from [place] 12/4 and started having severe diffuse back pain during night so came to the ED.PMH significant for renal cell mass status post nephrostomy tube, ureteral stent, ESRD on HD MWF PAF on Eliquis, chronic diastolic CHF nonobstructive CAD, previous CVA.   OT comments  Pt not feeling well this pm however pleasant and cooperative. Able to complete mobility and LB ADL tasks with min A @ RW level, however fatigues quickly. Pt demonstrates a significant functional decline and would benefit from rehab at SNF to maximize functional level of independence and reduce risk of falls. Pt anxious to regain her strength and independence. Pt agreeable to need for rehab at SNF given her deficits listed below. . BP supine 136/66; sitting 149/74. Acute OT to continue ot follow.    Recommendations for follow up therapy are one component of a multi-disciplinary discharge planning process, led by the attending physician.  Recommendations may be updated based on patient status, additional functional criteria and insurance authorization.    Follow Up Recommendations  Skilled nursing-short term rehab (<3 hours/day)     Assistance Recommended at Discharge Frequent or constant Supervision/Assistance  Patient can return home with the following  A little help with walking and/or transfers;A little help with bathing/dressing/bathroom;Assistance with cooking/housework;Direct supervision/assist for medications management;Direct supervision/assist for financial management;Assist for transportation;Help with stairs or ramp for entrance   Equipment Recommendations  None recommended by OT     Recommendations for Other Services      Precautions / Restrictions Precautions Precautions: Fall Restrictions Weight Bearing Restrictions: No       Mobility Bed Mobility Overal bed mobility: Needs Assistance Bed Mobility: Supine to Sit, Sit to Supine     Supine to sit: HOB elevated, Min guard Sit to supine: Min assist   General bed mobility comments: assist to lift LLE back up onto bed    Transfers Overall transfer level: Needs assistance Equipment used: None Transfers: Sit to/from Stand Sit to Stand: Min assist           General transfer comment: steadying assist     Balance Overall balance assessment: Needs assistance Sitting-balance support: Feet supported Sitting balance-Leahy Scale: Fair     Standing balance support: During functional activity, Single extremity supported Standing balance-Leahy Scale: Poor Standing balance comment: reliant on UE support                           ADL either performed or assessed with clinical judgement   ADL Overall ADL's : Needs assistance/impaired     Grooming: Set up   Upper Body Bathing: Set up   Lower Body Bathing: Minimal assistance;Sit to/from stand   Upper Body Dressing : Set up   Lower Body Dressing: Min guard   Toilet Transfer: Minimal assistance   Toileting- Clothing Manipulation and Hygiene: Minimal assistance Toileting - Clothing Manipulation Details (indicate cue type and reason): pt empties her urostomy bag    Reports increased difficulty with ADL tasks and inability to navigate steps in her home due to weakness; voiced that she wants to be able to care for herself and not rely on extended family        Extremity/Trunk Assessment Upper Extremity Assessment  Upper Extremity Assessment: Generalized weakness   Lower Extremity Assessment Lower Extremity Assessment: Defer to PT evaluation        Vision       Perception     Praxis      Cognition Arousal/Alertness:  Lethargic Behavior During Therapy: Flat affect Overall Cognitive Status: Within Functional Limits for tasks assessed                                 General Comments: reports increased problems with her memory; poor sleep quality        Exercises      Shoulder Instructions       General Comments Complaining of headache/nausea    Pertinent Vitals/ Pain       Pain Assessment Pain Assessment: Faces Faces Pain Scale: Hurts even more Pain Location: headache Pain Descriptors / Indicators: Headache  Home Living                                          Prior Functioning/Environment              Frequency  Min 2X/week        Progress Toward Goals  OT Goals(current goals can now be found in the care plan section)  Progress towards OT goals: Progressing toward goals  Acute Rehab OT Goals Patient Stated Goal: to get stronger before goin home OT Goal Formulation: With patient Time For Goal Achievement: 02/06/22 Potential to Achieve Goals: Good ADL Goals Pt Will Perform Upper Body Dressing: with modified independence;sitting Pt Will Perform Lower Body Dressing: with modified independence;sitting/lateral leans;sit to/from stand Pt Will Transfer to Toilet: with modified independence;ambulating;regular height toilet Pt Will Perform Tub/Shower Transfer: Tub transfer;Shower transfer;with modified independence;ambulating;rolling walker;shower seat  Plan Discharge plan needs to be updated    Co-evaluation                 AM-PAC OT "6 Clicks" Daily Activity     Outcome Measure   Help from another person eating meals?: None Help from another person taking care of personal grooming?: A Little Help from another person toileting, which includes using toliet, bedpan, or urinal?: A Little Help from another person bathing (including washing, rinsing, drying)?: A Little Help from another person to put on and taking off regular upper body  clothing?: A Little Help from another person to put on and taking off regular lower body clothing?: A Little 6 Click Score: 19    End of Session Equipment Utilized During Treatment: Rolling walker (2 wheels)  OT Visit Diagnosis: Unsteadiness on feet (R26.81);Muscle weakness (generalized) (M62.81);Pain Pain - part of body:  (urostomy catheter site)   Activity Tolerance Patient limited by fatigue (c/o nausea)   Patient Left in bed;with call bell/phone within reach;with bed alarm set   Nurse Communication Mobility status        Time: 0258-5277 OT Time Calculation (min): 17 min  Charges: OT General Charges $OT Visit: 1 Visit OT Treatments $Self Care/Home Management : 8-22 mins  Maurie Boettcher, OT/L   Acute OT Clinical Specialist [place] Pager [phone] Office [phone]   [facility] 01/26/2022, 3:43 PM

## 2022-01-27 DIAGNOSIS — S37012A Minor contusion of left kidney, initial encounter: Secondary | ICD-10-CM | POA: Diagnosis not present

## 2022-01-27 LAB — CULTURE, BLOOD (ROUTINE X 2)
Culture: NO GROWTH
Culture: NO GROWTH
Special Requests: ADEQUATE

## 2022-01-27 LAB — GLUCOSE, CAPILLARY
Glucose-Capillary: 212 mg/dL — ABNORMAL HIGH (ref 70–99)
Glucose-Capillary: 122 mg/dL — ABNORMAL HIGH (ref 70–99)
Glucose-Capillary: 132 mg/dL — ABNORMAL HIGH (ref 70–99)
Glucose-Capillary: 161 mg/dL — ABNORMAL HIGH (ref 70–99)

## 2022-01-27 MED ORDER — NEPRO/CARBSTEADY PO LIQD
237.0000 mL | Freq: Two times a day (BID) | ORAL | Status: DC
  Administered 2022-01-27: 237 mL via ORAL

## 2022-01-27 MED ORDER — RENA-VITE PO TABS
1.0000 | ORAL_TABLET | Freq: Every day | ORAL | Status: DC
  Administered 2022-01-27 – 2022-01-30 (×4): 1 via ORAL
  Filled 2022-01-27 (×4): qty 1

## 2022-01-27 MED ORDER — MELATONIN 5 MG PO TABS
5.0000 mg | ORAL_TABLET | Freq: Every day | ORAL | Status: DC
  Administered 2022-01-27 – 2022-01-30 (×4): 5 mg via ORAL
  Filled 2022-01-27 (×4): qty 1

## 2022-01-27 NOTE — Progress Notes (Signed)
PROGRESS NOTE                                                                                                                                                                                                             Patient Demographics:    Kim Burns, is a 86 y.o. female, DOB - 1936-01-19, MCN:[id number]  Outpatient Primary MD for the patient is Kim Bien, MD    LOS - 5  Admit date - 01/21/2022    Chief Complaint  Patient presents with   Back Pain       Brief Narrative (HPI from H&P)   86 year old female history of renal cell mass status post nephrostomy tube, ureteral stent, ESRD on HD MWF PAF on Eliquis, chronic diastolic CHF nonobstructive CAD, previous CVA presents with severe back pain found to have subcapsular hematoma.   See was having intermittent hematuria x3 wk,returned from a trip from [place] 12/4 and started having severe diffuse back pain during night so came to the ED.   In the ED had abnormal EKG with subtle inferior ST elevation and ST depression in lateral lead EDP discussed with on-call cardiology Dr. Irish Lack patient generally clean cath in July 2023 with 20% stenosis of RCA-advised holding off on cath and continue to repeat EKG follow-up troponins> +/- heparin if significant bump in troponin.  Labs showed significant leukocytosis, elevated creatinine 6.4, troponin 25 > 28 hemoglobin 9.0> 9.5>7.,5 blood glucose 425, lactic acidosis 5.7> 5.4> 4.3.CT renal study showed large left renal subcapsular hematoma extending into the perirenal space left pigtail ureteral stent in place, no hydronephrosis, urology  Dr Cain Sieve was consulted> advised that she does  Kcentra was given to reverse Eliquis.  Patient was started on IV antibiotics blood cultures and also start gentle IV fluids and admitted.   Subjective:   Patient in bed, appears comfortable, mild generalized headache, no fever, no chest pain or  pressure, no shortness of breath , much improved left flank pain. No new focal weakness.   Assessment  & Plan :   Large left renal subcapsular hematoma. Intermittent hematuria x3 weeks. S/p left tumor ablation c/b urine leak-refractory to management and with an indwelling stent and nephrostomy tube: Hemoglobin drifting down, has been on movement and exam. She is s/p 2 units of packed RBC transfusion, also received Kcentra on 01/22/2022, bleeding seems  to have held for now.  Seen by urology and IR.  CTA on 01/23/2022 shows no replete, per IR monitor.  Case discussed in detail with Dr. Louis Meckel patient's urologist on 01/24/2022, 01/25/2022, who wants to hold anticoagulation for at least 3 weeks thereafter cautious retrial of anticoagulation.  Currently no role for nephrectomy as the risk of procedure outweighs the benefits.  Please CBC trending up, left-sided flank pain better, no signs of ongoing active bleeding, stable for discharge, patient and family have now decided that they would like to go to SNF and not home with home health.  Acute blood loss anemia, anemia of chronic renal disease:  see above.  Suspected Severe Sepsis POA-given patient's lactic acidosis/leukocytosis, possible intra-abdominal source. History of left perinephric abscess: Clinically stable blood cultures negative, sepsis pathophysiology has resolved, tapered down antibiotics on 01/25/2022.  ESRD on HD MWF: Nephrology following .  Previous CVA, PAF on Eliquis: Eliquis discontinued,but will need to d/w risks/benefits;given her stroke history she is high risk w/ CHA2DS2-VASc Score = 7,  this was explained to patient and patient's son Kim Burns.  Upon family request cardiology team consulted who agreed with our management and signed off on 01/23/2022, home dose amiodarone continued, Coreg resumed.  Chronic diastolic CHF, EF 76% on recent echocardiogram: Compensated now.  Hypertension - blood pressure is improved to reintroduced Coreg.  As  needed hydralazine as well.  Type 2 diabetes mellitus with uncontrolled hyperglycemia, on long-term insulin: Continue SSI and monitor   CBG (last 3)  Recent Labs    01/26/22 1741 01/26/22 2219 01/27/22 0856  GLUCAP 121* 170* 212*        Condition - Extremely Guarded  Family Communication  :    [name] [phone]  - 01/23/22, 01/24/22, 01/26/2022  Code Status :  Full  Consults  :  Urology, PCCM , IR, Renal, Cardiology  PUD Prophylaxis : PPI   Procedures  :     CT - 1. Large left renal subcapsular hematoma extending into the perirenal space. 2. Similar positioning of the percutaneous drainage catheter and left pigtail ureteral stent. No hydronephrosis. 3. No bowel obstruction. 4. Partially visualized small left pleural effusion. 5.  Aortic Atherosclerosis.      Disposition Plan  :    Status is: Inpatient  DVT Prophylaxis  :    Place and maintain sequential compression device Start: 01/23/22 0601 SCDs Start: 01/22/22 0227    Lab Results  Component Value Date   PLT 241 01/26/2022    Diet :  Diet Order             Diet regular Room service appropriate? Yes; Fluid consistency: Thin  Diet effective now                    Inpatient Medications  Scheduled Meds:  (feeding supplement) PROSource Plus  30 mL Oral BID BM   amiodarone  100 mg Oral Daily   carvedilol  3.125 mg Oral BID WC   cephALEXin  500 mg Oral Q24H   cycloSPORINE  1 drop Both Eyes BID   darbepoetin (ARANESP) injection - NON-DIALYSIS  100 mcg Subcutaneous Q Thu-1800   ferrous sulfate  325 mg Oral BID WC   folic acid  1 mg Oral Daily   hydrALAZINE  50 mg Oral Q8H   insulin aspart  0-9 Units Subcutaneous TID WC   lipase/protease/amylase  36,000 Units Oral BID WC   melatonin  5 mg Oral QHS   multivitamin  1  tablet Oral QHS   pantoprazole  40 mg Oral Daily   rOPINIRole  0.5 mg Oral BID   senna  1 tablet Oral QHS   sevelamer carbonate  1,600 mg Oral TID WC   Continuous  Infusions:   PRN Meds:.acetaminophen, dextrose, hydrALAZINE, HYDROmorphone (DILAUDID) injection, oxyCODONE, polyethylene glycol, prochlorperazine       Objective:   Vitals:   01/26/22 1938 01/27/22 0000 01/27/22 0328 01/27/22 0905  BP: (!) 151/75 (!) 150/74 (!) 148/79 134/70  Pulse: 100 92 96 (!) 101  Resp: '17 20 18   '$ Temp: 98.1 F (36.7 C) 98 F (36.7 C) 98.4 F (36.9 C)   TempSrc: Oral Oral Oral   SpO2: 97% 94% 98%   Weight:      Height:        Wt Readings from Last 3 Encounters:  01/26/22 57.9 kg  11/23/21 58.8 kg  11/07/21 58.5 kg     Intake/Output Summary (Last 24 hours) at 01/27/2022 0907 Last data filed at 01/27/2022 0800 Gross per 24 hour  Intake 120 ml  Output 802 ml  Net -682 ml     Physical Exam  Awake Alert, No new F.N deficits, left external nephrostomy tube/drain in place [place].AT,PERRAL Supple Neck, No JVD,   Symmetrical Chest wall movement, Good air movement bilaterally, CTAB RRR,No Gallops, Rubs or new Murmurs,  +ve B.Sounds, Abd Soft, No tenderness,   No Cyanosis, Clubbing or edema      Data Review:    Recent Labs  Lab 01/21/22 2305 01/22/22 0017 01/23/22 0422 01/23/22 1224 01/24/22 0736 01/25/22 0327 01/26/22 0230  WBC 23.6*   < > 11.8* 11.7* 12.4* 10.7* 8.9  HGB 9.0*   < > 8.4* 8.1* 8.4* 8.0* 8.3*  HCT 29.5*   < > 25.4* 24.0* 25.8* 24.1* 25.0*  PLT 325   < > 179 165 212 194 241  MCV 91.3   < > 86.7 87.0 89.3 88.9 89.3  MCH 27.9   < > 28.7 29.3 29.1 29.5 29.6  MCHC 30.5   < > 33.1 33.8 32.6 33.2 33.2  RDW 18.8*   < > 17.2* 17.6* 17.5* 17.5* 17.2*  LYMPHSABS 2.7  --   --   --  1.2 0.8 0.9  MONOABS 0.6  --   --   --  0.6 0.6 0.6  EOSABS 0.1  --   --   --  0.0 0.1 0.2  BASOSABS 0.1  --   --   --  0.1 0.0 0.0   < > = values in this interval not displayed.    Recent Labs  Lab 01/21/22 2305 01/22/22 0001 01/22/22 0038 01/22/22 0506 01/22/22 0545 01/22/22 0857 01/22/22 1243 01/22/22 1257 01/22/22 1745 01/23/22 0422  01/24/22 0736 01/25/22 0327 01/26/22 0230  NA 136   < >  --  138  --   --   --  138  --  137 135 134* 134*  K 4.3   < >  --  5.4*  --   --   --  4.8  --  3.7 4.2 3.4* 3.6  CL 104   < >  --  105  --   --   --   --   --  96* 98 95* 95*  CO2 19*  --   --  20*  --   --   --   --   --  '26 23 28 26  '$ GLUCOSE 378*   < >  --  379*  --   --   --   --   --  129* 154* 100* 142*  BUN 59*   < >  --  62*  --   --   --   --   --  38* 50* 20 25*  CREATININE 6.46*   < >  --  6.70*  --   --   --   --   --  4.70* 6.06* 3.31* 4.42*  AST 37  --   --  28  --   --   --   --   --   --   --   --   --   ALT 52*  --   --  47*  --   --   --   --   --   --   --   --   --   ALKPHOS 55  --   --  45  --   --   --   --   --   --   --   --   --   BILITOT 0.4  --   --  0.4  --   --   --   --   --   --   --   --   --   ALBUMIN 2.4*  --   --  2.3*  --   --   --   --   --   --   --   --   --   LATICACIDVEN  --    < > 5.7*  --  4.3* 5.0* 3.5*  --  3.0* 1.5  --   --   --   INR  --   --  1.4*  --   --   --   --   --   --   --   --   --   --   HGBA1C  --   --   --  6.6*  --   --   --   --   --   --   --   --   --   MG  --   --   --  2.8*  --   --   --   --   --   --   --   --   --   CALCIUM 8.7*  --   --  9.0  --   --   --   --   --  9.0 8.6* 8.1* 8.4*   < > = values in this interval not displayed.      Radiology Reports CT Angio Abd/Pel w/ and/or w/o  Result Date: 01/23/2022 CLINICAL DATA:  Acute left-sided subcapsular renal and perinephric hemorrhage. EXAM: CT ANGIOGRAPHY ABDOMEN AND PELVIS WITH CONTRAST AND WITHOUT CONTRAST TECHNIQUE: Multidetector CT imaging of the abdomen and pelvis was performed using the standard protocol during bolus administration of intravenous contrast. Multiplanar reconstructed images and MIPs were obtained and reviewed to evaluate the vascular anatomy. RADIATION DOSE REDUCTION: This exam was performed according to the departmental dose-optimization program which includes automated exposure control,  adjustment of the mA and/or kV according to patient size and/or use of iterative reconstruction technique. CONTRAST:  34m OMNIPAQUE IOHEXOL 350 MG/ML SOLN COMPARISON:  Non-contrast CT of the abdomen and pelvis on 01/22/2022 FINDINGS: VASCULAR Aorta: Atherosclerosis of the abdominal aorta without evidence of aneurysm or dissection. Celiac: Normally patent. Normally patent branch vessels  and branch vessel anatomy. SMA: Normally patent. Renals: Calcified plaque at origins of bilateral single renal arteries without significant stenosis. IMA: Normally patent. Inflow: Normally patent bilateral iliac arteries without aneurysm. Proximal Outflow: Normally patent bilateral common femoral arteries and femoral bifurcations. Closure device ring on top of the right common femoral artery without mass effect or pseudoaneurysm. Veins: No venous abnormalities on venous phase imaging. Review of the MIP images confirms the above findings. NON-VASCULAR Lower chest: Small left pleural effusion and mild left basilar atelectasis. Hepatobiliary: No focal liver abnormality is seen. Status post cholecystectomy. No biliary dilatation. Pancreas: Status post Whipple procedure and pancreatectomy. Spleen: Normal in size without focal abnormality. Adrenals/Urinary Tract: Stable left posterior subcapsular and perinephric hematoma measuring approximately 9 cm in greatest diameter. This is stable when measuring at the same level on the prior unenhanced CT. No evidence of active bleeding on arterial or venous phases of imaging. No arterial pseudoaneurysm identified. Underlying left kidney demonstrates atrophy and some degree of underlying hydronephrosis. A ureteral stent is present extending from the collecting system into the bladder. Additional percutaneous drainage catheter remains present just inferior to the lower pole collecting system without fluid collection. Stomach/Bowel: No evidence of bowel obstruction, ileus or free intraperitoneal air.  Lymphatic: No enlarged abdominal or pelvic lymph nodes. Reproductive: Status post hysterectomy. No adnexal masses. Other: No abdominal wall hernia or abnormality. No abdominopelvic ascites. Musculoskeletal: Stable degenerative disc disease of the lumbar spine. Stable osteopenia. No bony lesions or fractures. IMPRESSION: 1. Stable left posterior subcapsular and perinephric hematoma measuring approximately 9 cm in greatest diameter. No evidence of active bleeding on arterial or venous phases of imaging. No arterial pseudoaneurysm identified. 2. Underlying left renal atrophy and some degree of underlying hydronephrosis. A ureteral stent is present extending from the collecting system into the bladder. Additional percutaneous drainage catheter remains present just inferior to the lower pole collecting system. 3. Small left pleural effusion and mild left basilar atelectasis. 4. Aortic atherosclerosis. Aortic Atherosclerosis (ICD10-I70.0). Electronically Signed   By: Aletta Edouard M.D.   On: 01/23/2022 12:29      Signature  -   Lala Lund M.D on 01/27/2022 at 9:07 AM   -  To page go to [email or web address]

## 2022-01-27 NOTE — Plan of Care (Signed)

## 2022-01-27 NOTE — TOC Progression Note (Signed)
Transition of Care [facility]) - Progression Note    Patient Details  Name: Kim Burns MRN: [id number] Date of Birth: [birth year]  Transition of Care [facility]) CM/SW [place], [place] Phone Number: 01/27/2022, 10:57 AM  Clinical Narrative:     Pt has no bed offer's at this time.    Expected Discharge Plan: [place] Barriers to Discharge: Continued Medical Work up  Expected Discharge Plan and Services Expected Discharge Plan: [place]   Discharge Planning Services: CM Consult                                 [facility] Agency: [place] Date [place]: 01/23/22 Time [place]: [phone] Representative spoke with at [place]: [place] ([facility]) Interventions    Readmission Risk Interventions    08/27/2021   10:16 AM  Readmission Risk Prevention Plan  Transportation Screening Complete  PCP or Specialist Appt within 3-5 Days Complete  HRI or [place] Complete  Social Work Consult for [place] Planning/Counseling Complete  Palliative Care Screening Not Applicable  Medication Review [employer]) Complete

## 2022-01-27 NOTE — Progress Notes (Signed)
Mobility Specialist Progress Note:   01/27/22 1436  Mobility  Activity Dangled on edge of bed  Level of Assistance Standby assist, set-up cues, supervision of patient - no hands on  Assistive Device None  Activity Response Tolerated fair  Mobility Referral Yes  $Mobility charge 1 Mobility   Pt received in bed and agreeable. C/o fatigue throughout session. Pt deferred further mobility d/t fatigue. Pt left in bed with all needs met and call bell in reach.   Andrey Campanile Mobility Specialist Please contact via SecureChat or  Rehab office at [phone]

## 2022-01-27 NOTE — Progress Notes (Signed)
Pt refused to take insulin for CBG below 200.MD aware

## 2022-01-27 NOTE — Progress Notes (Signed)
[facility] Progress Note   Subjective:  Seen in room - feeling ok this morning. Appetite low and says reduced taste - nothing tastes good. Awaiting SNF placemenet - hopefully we will hear something tomorrow.  Objective Vitals:   01/26/22 1938 01/27/22 0000 01/27/22 0328 01/27/22 0905  BP: (!) 151/75 (!) 150/74 (!) 148/79 134/70  Pulse: 100 92 96 (!) 101  Resp: '17 20 18   '$ Temp: 98.1 F (36.7 C) 98 F (36.7 C) 98.4 F (36.9 C)   TempSrc: Oral Oral Oral   SpO2: 97% 94% 98%   Weight:      Height:       Physical Exam General: Frail woman, NAD. [place] O2 in place Heart: RRR; 2/6 murmur Lungs: CTAB anteriorly Abdomen: soft, L flank JP drain with clear urine present Extremities: no LE edema Dialysis Access: LUE AVF + bruit  Additional Objective Labs: Basic Metabolic Panel: Recent Labs  Lab 01/22/22 0506 01/22/22 1257 01/24/22 0736 01/25/22 0327 01/26/22 0230  NA 138   < > 135 134* 134*  K 5.4*   < > 4.2 3.4* 3.6  CL 105   < > 98 95* 95*  CO2 20*   < > '23 28 26  '$ GLUCOSE 379*   < > 154* 100* 142*  BUN 62*   < > 50* 20 25*  CREATININE 6.70*   < > 6.06* 3.31* 4.42*  CALCIUM 9.0   < > 8.6* 8.1* 8.4*  PHOS 6.8*  --   --   --   --    < > = values in this interval not displayed.   Liver Function Tests: Recent Labs  Lab 01/21/22 2305 01/22/22 0506  AST 37 28  ALT 52* 47*  ALKPHOS 55 45  BILITOT 0.4 0.4  PROT 5.6* 5.2*  ALBUMIN 2.4* 2.3*   CBC: Recent Labs  Lab 01/23/22 0422 01/23/22 1224 01/24/22 0736 01/25/22 0327 01/26/22 0230  WBC 11.8* 11.7* 12.4* 10.7* 8.9  NEUTROABS  --   --  10.5* 9.2* 7.0  HGB 8.4* 8.1* 8.4* 8.0* 8.3*  HCT 25.4* 24.0* 25.8* 24.1* 25.0*  MCV 86.7 87.0 89.3 88.9 89.3  PLT 179 165 212 194 241   Blood Culture    Component Value Date/Time   SDES BLOOD RIGHT ANTECUBITAL 01/22/2022 0038   SPECREQUEST  01/22/2022 0038    BOTTLES DRAWN AEROBIC AND ANAEROBIC Blood Culture results may not be optimal due to an inadequate  volume of blood received in culture bottles   CULT  01/22/2022 0038    NO GROWTH 4 DAYS Performed at [facility], [place] [address], [place], [place] [address]    REPTSTATUS PENDING 01/22/2022 0038   Medications:   (feeding supplement) PROSource Plus  30 mL Oral BID BM   amiodarone  100 mg Oral Daily   carvedilol  3.125 mg Oral BID WC   cephALEXin  500 mg Oral Q24H   cycloSPORINE  1 drop Both Eyes BID   darbepoetin (ARANESP) injection - NON-DIALYSIS  100 mcg Subcutaneous Q Thu-1800   ferrous sulfate  325 mg Oral BID WC   folic acid  1 mg Oral Daily   hydrALAZINE  50 mg Oral Q8H   insulin aspart  0-9 Units Subcutaneous TID WC   lipase/protease/amylase  36,000 Units Oral BID WC   melatonin  5 mg Oral QHS   multivitamin  1 tablet Oral QHS   pantoprazole  40 mg Oral Daily   rOPINIRole  0.5 mg Oral  BID   senna  1 tablet Oral QHS   sevelamer carbonate  1,600 mg Oral TID WC    Dialysis Orders: MWF at [facility] 3:45hr, 350/A1.5, EDW 58kg, 2K/2Ca, AVF, 16g needles, no heparin - Calcitriol 1.28mg PO q HD - No recent ESA - was on hold for Hgb 12.9 on 01/16/22 - Binders: Renvela 2/meals.   Assessment/Plan:  L renal hematoma: In setting of prior urinoma s/p RCC ablation. Hematoma is not communicating with current JP drain as her bag is not bloody.  Urology and IR involved, no further treatment at this time. Considering embolizing the L kidney in the future. Sepsis: WBC and LA very high on admit, started on IV Cefepime - both improved, will finish course PO Keflex.  ESRD: Usual MWF schedule - TTS schedule last week -> now back to usual, next HD tomorrow (12/11) - will do 1st shift in case ready to d/c to SNF tomorrow afternoon.  Hypertension/volume: BP stable, slightly below prior dry weight.  Anemia of ESRD + ABLA: Hgb dropped from 12.9 last week ->7.5 on admit. 2U PRBCs given 12/5. Eliquis on hold. Hgb now stablizing 8-8.4 range. Aranesp 1021m given 12/7.  Metabolic bone  disease: CorrCa high, Phos high. Hold VDRA for now, resumed home binders.  Nutrition:  Alb low, she dislikes pro-source supplement. Appetite low, adding rena-vite.  T2DM  A-fib: Home Eliquis on hold -> plan to stay off for at least 3 weeks then re-eval per notes.  KaVeneta PentonPA-C 01/27/2022, 9:41 AM  [employer]

## 2022-01-28 DIAGNOSIS — S37012A Minor contusion of left kidney, initial encounter: Secondary | ICD-10-CM | POA: Diagnosis not present

## 2022-01-28 LAB — CBC WITH DIFFERENTIAL/PLATELET
Abs Immature Granulocytes: 0.08 10*3/uL — ABNORMAL HIGH (ref 0.00–0.07)
Basophils Absolute: 0 10*3/uL (ref 0.0–0.1)
Basophils Relative: 0 %
Eosinophils Absolute: 0.2 10*3/uL (ref 0.0–0.5)
Eosinophils Relative: 2 %
HCT: 28.1 % — ABNORMAL LOW (ref 36.0–46.0)
Hemoglobin: 9.1 g/dL — ABNORMAL LOW (ref 12.0–15.0)
Immature Granulocytes: 1 %
Lymphocytes Relative: 10 %
Lymphs Abs: 0.9 10*3/uL (ref 0.7–4.0)
MCH: 29.2 pg (ref 26.0–34.0)
MCHC: 32.4 g/dL (ref 30.0–36.0)
MCV: 90.1 fL (ref 80.0–100.0)
Monocytes Absolute: 0.8 10*3/uL (ref 0.1–1.0)
Monocytes Relative: 8 %
Neutro Abs: 7.2 10*3/uL (ref 1.7–7.7)
Neutrophils Relative %: 79 %
Platelets: 345 10*3/uL (ref 150–400)
RBC: 3.12 MIL/uL — ABNORMAL LOW (ref 3.87–5.11)
RDW: 17.6 % — ABNORMAL HIGH (ref 11.5–15.5)
WBC: 9.1 10*3/uL (ref 4.0–10.5)
nRBC: 0.2 % (ref 0.0–0.2)

## 2022-01-28 LAB — RENAL FUNCTION PANEL
Albumin: 2.2 g/dL — ABNORMAL LOW (ref 3.5–5.0)
Anion gap: 12 (ref 5–15)
BUN: 21 mg/dL (ref 8–23)
CO2: 26 mmol/L (ref 22–32)
Calcium: 8.6 mg/dL — ABNORMAL LOW (ref 8.9–10.3)
Chloride: 93 mmol/L — ABNORMAL LOW (ref 98–111)
Creatinine, Ser: 4.42 mg/dL — ABNORMAL HIGH (ref 0.44–1.00)
GFR, Estimated: 9 mL/min — ABNORMAL LOW (ref >60)
Glucose, Bld: 141 mg/dL — ABNORMAL HIGH (ref 70–99)
Phosphorus: 3.6 mg/dL (ref 2.5–4.6)
Potassium: 3.8 mmol/L (ref 3.5–5.1)
Sodium: 131 mmol/L — ABNORMAL LOW (ref 135–145)

## 2022-01-28 LAB — GLUCOSE, CAPILLARY
Glucose-Capillary: 207 mg/dL — ABNORMAL HIGH (ref 70–99)
Glucose-Capillary: 98 mg/dL (ref 70–99)
Glucose-Capillary: 211 mg/dL — ABNORMAL HIGH (ref 70–99)
Glucose-Capillary: 148 mg/dL — ABNORMAL HIGH (ref 70–99)

## 2022-01-28 LAB — MRSA NEXT GEN BY PCR, NASAL: MRSA by PCR Next Gen: NOT DETECTED

## 2022-01-28 MED ORDER — PANCRELIPASE (LIP-PROT-AMYL) 36000-114000 UNITS PO CPEP
36000.0000 [IU] | ORAL_CAPSULE | Freq: Three times a day (TID) | ORAL | Status: DC
  Administered 2022-01-28: 36000 [IU] via ORAL

## 2022-01-28 MED ORDER — INSULIN ASPART 100 UNIT/ML IJ SOLN
0.0000 [IU] | Freq: Three times a day (TID) | INTRAMUSCULAR | Status: DC
  Administered 2022-01-28: 2 [IU] via SUBCUTANEOUS

## 2022-01-28 MED ORDER — PANCRELIPASE (LIP-PROT-AMYL) 36000-114000 UNITS PO CPEP
36000.0000 [IU] | ORAL_CAPSULE | Freq: Three times a day (TID) | ORAL | Status: DC
  Administered 2022-01-29 – 2022-01-31 (×5): 36000 [IU] via ORAL
  Filled 2022-01-28 (×9): qty 1

## 2022-01-28 NOTE — Progress Notes (Signed)
[name] Progress Note   Subjective:   Seen on HD, tolerating well. Denies SOB, CP, dizziness and nausea. Awaiting SNF.   Objective Vitals:   01/28/22 1000 01/28/22 1030 01/28/22 1100 01/28/22 1130  BP: (!) 144/65 137/64 134/66 134/65  Pulse: 75 76 77 80  Resp: (!) 21 (!) 23 (!) 24 (!) 27  Temp:      TempSrc:      SpO2: 100% 98% 99% 99%  Weight:      Height:       Physical Exam General: Alert female in NAD Heart: RRR, no murmurs, rubs or gallops Lungs: CTA bilaterally without wheezing, rhonchi or rales Abdomen: Soft, non-distended, +BS Extremities: No edema b/l lower extremities Dialysis Access:  LUE AVF accessed  Additional Objective Labs: Basic Metabolic Panel: Recent Labs  Lab 01/22/22 0506 01/22/22 1257 01/25/22 0327 01/26/22 0230 01/28/22 0318  NA 138   < > 134* 134* 131*  K 5.4*   < > 3.4* 3.6 3.8  CL 105   < > 95* 95* 93*  CO2 20*   < > '28 26 26  '$ GLUCOSE 379*   < > 100* 142* 141*  BUN 62*   < > 20 25* 21  CREATININE 6.70*   < > 3.31* 4.42* 4.42*  CALCIUM 9.0   < > 8.1* 8.4* 8.6*  PHOS 6.8*  --   --   --  3.6   < > = values in this interval not displayed.   Liver Function Tests: Recent Labs  Lab 01/21/22 2305 01/22/22 0506 01/28/22 0318  AST 37 28  --   ALT 52* 47*  --   ALKPHOS 55 45  --   BILITOT 0.4 0.4  --   PROT 5.6* 5.2*  --   ALBUMIN 2.4* 2.3* 2.2*   No results for input(s): "LIPASE", "AMYLASE" in the last 168 hours. CBC: Recent Labs  Lab 01/23/22 1224 01/24/22 0736 01/25/22 0327 01/26/22 0230 01/28/22 0318  WBC 11.7* 12.4* 10.7* 8.9 9.1  NEUTROABS  --  10.5* 9.2* 7.0 7.2  HGB 8.1* 8.4* 8.0* 8.3* 9.1*  HCT 24.0* 25.8* 24.1* 25.0* 28.1*  MCV 87.0 89.3 88.9 89.3 90.1  PLT 165 212 194 241 345   Blood Culture    Component Value Date/Time   SDES BLOOD RIGHT ANTECUBITAL 01/22/2022 0038   SPECREQUEST  01/22/2022 0038    BOTTLES DRAWN AEROBIC AND ANAEROBIC Blood Culture results may not be optimal due to an  inadequate volume of blood received in culture bottles   CULT  01/22/2022 0038    NO GROWTH 5 DAYS Performed at [facility], [place] [address] [place], [place] [address]    REPTSTATUS 01/27/2022 FINAL 01/22/2022 0038    Cardiac Enzymes: No results for input(s): "CKTOTAL", "CKMB", "CKMBINDEX", "TROPONINI" in the last 168 hours. CBG: Recent Labs  Lab 01/27/22 0856 01/27/22 1150 01/27/22 1611 01/27/22 2134 01/28/22 0803  GLUCAP 212* 122* 132* 161* 207*   Iron Studies: No results for input(s): "IRON", "TIBC", "TRANSFERRIN", "FERRITIN" in the last 72 hours. '@lablastinr3'$ @ Studies/Results: No results found. Medications:   amiodarone  100 mg Oral Daily   carvedilol  3.125 mg Oral BID WC   cephALEXin  500 mg Oral Q24H   cycloSPORINE  1 drop Both Eyes BID   darbepoetin (ARANESP) injection - NON-DIALYSIS  100 mcg Subcutaneous Q Thu-1800   feeding supplement (NEPRO CARB STEADY)  237 mL Oral BID BM   ferrous sulfate  325 mg Oral BID WC  folic acid  1 mg Oral Daily   hydrALAZINE  50 mg Oral Q8H   insulin aspart  0-6 Units Subcutaneous TID WC   lipase/protease/amylase  36,000 Units Oral BID WC   melatonin  5 mg Oral QHS   multivitamin  1 tablet Oral QHS   pantoprazole  40 mg Oral Daily   rOPINIRole  0.5 mg Oral BID   senna  1 tablet Oral QHS   sevelamer carbonate  1,600 mg Oral TID WC    Dialysis Orders: MWF at [facility] 3:45hr, 350/A1.5, EDW 58kg, 2K/2Ca, AVF, 16g needles, no heparin - Calcitriol 1.44mg PO q HD - No recent ESA - was on hold for Hgb 12.9 on 01/16/22 - Binders: Renvela 2/meals.  Assessment/Plan:  L renal hematoma: In setting of prior urinoma s/p RCC ablation. Hematoma is not communicating with current JP drain as her bag is not bloody.  Urology and IR involved, no further treatment at this time. Considering embolizing the L kidney in the future. Sepsis: WBC and LA very high on admit, started on IV Cefepime - both improved, will finish course PO  Keflex.  ESRD: Usual MWF schedule - TTS schedule last week -> now back to usual  Hypertension/volume: BP stable, slightly below prior dry weight.  Anemia of ESRD + ABLA: Hgb dropped from 12.9 last week ->7.5 on admit. 2U PRBCs given 12/5. Eliquis on hold. Hgb now stablizing, up to 9.1. Aranesp 1059m given 12/7.  Metabolic bone disease: CorrCa high, Phos high. Hold VDRA for now, resumed home binders.  Nutrition:  Alb low, she dislikes pro-source supplement. Appetite low, adding rena-vite.  T2DM  A-fib: Home Eliquis on hold -> plan to stay off for at least 3 weeks then re-eval per notes.  SaAnice PaganiniPA-C 01/28/2022, 11:49 AM  [facility] Pager: [phone]

## 2022-01-28 NOTE — Progress Notes (Signed)
PROGRESS NOTE                                                                                                                                                                                                             Patient Demographics:    Kim Burns, is a 86 y.o. female, DOB - Jun 26, 1935, ONG:[id number]  Outpatient Primary MD for the patient is [name], MD    LOS - 6  Admit date - 01/21/2022    Chief Complaint  Patient presents with   Back Pain       Brief Narrative (HPI from H&P)   86 year old female history of renal cell mass status post nephrostomy tube, ureteral stent, ESRD on HD MWF PAF on Eliquis, chronic diastolic CHF nonobstructive CAD, previous CVA presents with severe back pain found to have subcapsular hematoma.   See was having intermittent hematuria x3 wk,returned from a trip from [place] 12/4 and started having severe diffuse back pain during night so came to the ED.   In the ED had abnormal EKG with subtle inferior ST elevation and ST depression in lateral lead EDP discussed with on-call cardiology Dr. Irish Lack patient generally clean cath in July 2023 with 20% stenosis of RCA-advised holding off on cath and continue to repeat EKG follow-up troponins> +/- heparin if significant bump in troponin.  Labs showed significant leukocytosis, elevated creatinine 6.4, troponin 25 > 28 hemoglobin 9.0> 9.5>7.,5 blood glucose 425, lactic acidosis 5.7> 5.4> 4.3.CT renal study showed large left renal subcapsular hematoma extending into the perirenal space left pigtail ureteral stent in place, no hydronephrosis, urology  Dr Cain Sieve was consulted> advised that she does  Kcentra was given to reverse Eliquis.  Patient was started on IV antibiotics blood cultures and also start gentle IV fluids and admitted.   Subjective:   Patient in bed, appears comfortable, denies any headache, no fever, no chest pain or pressure,  no shortness of breath , much improved flank/abdominal pain. No new focal weakness.    Assessment  & Plan :   Large left renal subcapsular hematoma. Intermittent hematuria x3 weeks. S/p left tumor ablation c/b urine leak-refractory to management and with an indwelling stent and nephrostomy tube: Hemoglobin drifting down, has been on movement and exam. She is s/p 2 units of packed RBC transfusion, also received Kcentra on 01/22/2022, bleeding seems  to have held for now.  Seen by urology and IR.  CTA on 01/23/2022 shows no replete, per IR monitor.  Case discussed in detail with Dr. Louis Meckel patient's urologist on 01/24/2022, 01/25/2022, who wants to hold anticoagulation for at least 3 weeks thereafter cautious retrial of anticoagulation.  Currently no role for nephrectomy as the risk of procedure outweighs the benefits.  Please CBC trending up, left-sided flank pain better, no signs of ongoing active bleeding, stable for discharge, patient and family have now decided that they would like to go to SNF and not home with home health.  TOC looking for appropriate placement.  Acute blood loss anemia, anemia of chronic renal disease:  see above.  Suspected Severe Sepsis POA-given patient's lactic acidosis/leukocytosis, possible intra-abdominal source. History of left perinephric abscess: Clinically stable blood cultures negative, sepsis pathophysiology has resolved, tapered down antibiotics on 01/25/2022.  ESRD on HD MWF: Nephrology following .  Previous CVA, PAF on Eliquis: Eliquis discontinued,but will need to d/w risks/benefits;given her stroke history she is high risk w/ CHA2DS2-VASc Score = 7,  this was explained to patient and patient's son Kim Burns.  Upon family request cardiology team consulted who agreed with our management and signed off on 01/23/2022, home dose amiodarone continued, Coreg resumed.  Chronic diastolic CHF, EF 02% on recent echocardiogram: Compensated now.  Hypertension - blood pressure  is improved to reintroduced Coreg.  As needed hydralazine as well.  Type 2 diabetes mellitus with uncontrolled hyperglycemia, on long-term insulin: Continue SSI and monitor   CBG (last 3)  Recent Labs    01/27/22 1611 01/27/22 2134 01/28/22 0803  GLUCAP 132* 161* 207*        Condition - Extremely Guarded  Family Communication  :    [name] [phone]  - 01/23/22, 01/24/22, 01/26/2022  Code Status :  Full  Consults  :  Urology, PCCM , IR, Renal, Cardiology  PUD Prophylaxis : PPI   Procedures  :     CT - 1. Large left renal subcapsular hematoma extending into the perirenal space. 2. Similar positioning of the percutaneous drainage catheter and left pigtail ureteral stent. No hydronephrosis. 3. No bowel obstruction. 4. Partially visualized small left pleural effusion. 5.  Aortic Atherosclerosis.      Disposition Plan  :    Status is: Inpatient  DVT Prophylaxis  :    Place and maintain sequential compression device Start: 01/23/22 0601 SCDs Start: 01/22/22 0227    Lab Results  Component Value Date   PLT 345 01/28/2022    Diet :  Diet Order             Diet regular Room service appropriate? Yes; Fluid consistency: Thin  Diet effective now                    Inpatient Medications  Scheduled Meds:  amiodarone  100 mg Oral Daily   carvedilol  3.125 mg Oral BID WC   cephALEXin  500 mg Oral Q24H   cycloSPORINE  1 drop Both Eyes BID   darbepoetin (ARANESP) injection - NON-DIALYSIS  100 mcg Subcutaneous Q Thu-1800   feeding supplement (NEPRO CARB STEADY)  237 mL Oral BID BM   ferrous sulfate  325 mg Oral BID WC   folic acid  1 mg Oral Daily   hydrALAZINE  50 mg Oral Q8H   insulin aspart  0-6 Units Subcutaneous TID WC   lipase/protease/amylase  36,000 Units Oral BID WC   melatonin  5 mg  Oral QHS   multivitamin  1 tablet Oral QHS   pantoprazole  40 mg Oral Daily   rOPINIRole  0.5 mg Oral BID   senna  1 tablet Oral QHS   sevelamer carbonate  1,600  mg Oral TID WC   Continuous Infusions:   PRN Meds:.acetaminophen, dextrose, hydrALAZINE, oxyCODONE, polyethylene glycol, prochlorperazine       Objective:   Vitals:   01/27/22 1944 01/27/22 2327 01/28/22 0508 01/28/22 0805  BP: (!) 152/83 (!) 153/73 (!) 144/86 (!) 141/71  Pulse: 98 84 100 100  Resp: '18 20 20 16  '$ Temp: 98.7 F (37.1 C) 98.4 F (36.9 C) 98.5 F (36.9 C) 97.6 F (36.4 C)  TempSrc: Oral Oral Oral Oral  SpO2: 96% 96% 96% 96%  Weight:      Height:        Wt Readings from Last 3 Encounters:  01/26/22 57.9 kg  11/23/21 58.8 kg  11/07/21 58.5 kg     Intake/Output Summary (Last 24 hours) at 01/28/2022 0835 Last data filed at 01/28/2022 0800 Gross per 24 hour  Intake 120 ml  Output 500 ml  Net -380 ml     Physical Exam  Awake Alert, No new F.N deficits, left external nephrostomy tube/drain in place [place].AT,PERRAL Supple Neck, No JVD,   Symmetrical Chest wall movement, Good air movement bilaterally, CTAB RRR,No Gallops, Rubs or new Murmurs,  +ve B.Sounds, Abd Soft, No tenderness,   No Cyanosis, Clubbing or edema       Data Review:    Recent Labs  Lab 01/21/22 2305 01/22/22 0017 01/23/22 1224 01/24/22 0736 01/25/22 0327 01/26/22 0230 01/28/22 0318  WBC 23.6*   < > 11.7* 12.4* 10.7* 8.9 9.1  HGB 9.0*   < > 8.1* 8.4* 8.0* 8.3* 9.1*  HCT 29.5*   < > 24.0* 25.8* 24.1* 25.0* 28.1*  PLT 325   < > 165 212 194 241 345  MCV 91.3   < > 87.0 89.3 88.9 89.3 90.1  MCH 27.9   < > 29.3 29.1 29.5 29.6 29.2  MCHC 30.5   < > 33.8 32.6 33.2 33.2 32.4  RDW 18.8*   < > 17.6* 17.5* 17.5* 17.2* 17.6*  LYMPHSABS 2.7  --   --  1.2 0.8 0.9 0.9  MONOABS 0.6  --   --  0.6 0.6 0.6 0.8  EOSABS 0.1  --   --  0.0 0.1 0.2 0.2  BASOSABS 0.1  --   --  0.1 0.0 0.0 0.0   < > = values in this interval not displayed.    Recent Labs  Lab 01/21/22 2305 01/22/22 0001 01/22/22 0038 01/22/22 0506 01/22/22 0545 01/22/22 0857 01/22/22 1243 01/22/22 1257 01/22/22 1745  01/23/22 0422 01/24/22 0736 01/25/22 0327 01/26/22 0230  NA 136   < >  --  138  --   --   --  138  --  137 135 134* 134*  K 4.3   < >  --  5.4*  --   --   --  4.8  --  3.7 4.2 3.4* 3.6  CL 104   < >  --  105  --   --   --   --   --  96* 98 95* 95*  CO2 19*  --   --  20*  --   --   --   --   --  '26 23 28 26  '$ GLUCOSE 378*   < >  --  379*  --   --   --   --   --  129* 154* 100* 142*  BUN 59*   < >  --  62*  --   --   --   --   --  38* 50* 20 25*  CREATININE 6.46*   < >  --  6.70*  --   --   --   --   --  4.70* 6.06* 3.31* 4.42*  AST 37  --   --  28  --   --   --   --   --   --   --   --   --   ALT 52*  --   --  47*  --   --   --   --   --   --   --   --   --   ALKPHOS 55  --   --  45  --   --   --   --   --   --   --   --   --   BILITOT 0.4  --   --  0.4  --   --   --   --   --   --   --   --   --   ALBUMIN 2.4*  --   --  2.3*  --   --   --   --   --   --   --   --   --   LATICACIDVEN  --    < > 5.7*  --  4.3* 5.0* 3.5*  --  3.0* 1.5  --   --   --   INR  --   --  1.4*  --   --   --   --   --   --   --   --   --   --   HGBA1C  --   --   --  6.6*  --   --   --   --   --   --   --   --   --   MG  --   --   --  2.8*  --   --   --   --   --   --   --   --   --   CALCIUM 8.7*  --   --  9.0  --   --   --   --   --  9.0 8.6* 8.1* 8.4*   < > = values in this interval not displayed.      Radiology Reports No results found.    Signature  -   Lala Lund M.D on 01/28/2022 at 8:35 AM   -  To page go to [email or web address]

## 2022-01-28 NOTE — TOC Progression Note (Addendum)
Transition of Care [facility]) - Progression Note    Patient Details  Name: Kim Burns MRN: [id number] Date of Birth: Dec 22, 1935  [facility] CM/SW [place], LCSW Phone Number: 01/28/2022, 5:19 PM  Clinical Narrative:    2:45pm-CSW received call from patient's son, Kalman Jewels). He reported that he contacted [facility] and was told that they cannot accommodate anymore dialysis facilities and [facility] cannot either. CSW provided him with bed offer of [facility] (contingent on dialysis confirmation) and [facility] (contingent on setting up dialysis transport with an outside company). He stated patient's family will resume transporting her to dialysis once she returns home.  5:15pm-CSW asked [facility] if they can accept patient at her current OP HD schedule and location. They stated they would not be able to transport to [place] but would need [facility] TTS around 10:15a, or [facility], [place], or [place]. CSW spoke with [facility] and they are able to accept patient at her current HD location.   CSW left voicemail for patient's son to determine which route he would like to take. CSW also sent message to [facility] transport to see if they would accept patient for transport to [place] with her home address being outside of [place].   CSW initiated insurance authorization process pending a facility choice, Ref# [id number].   Expected Discharge Plan: Skilled Nursing Facility Barriers to Discharge: Ship broker, SNF Pending bed offer  Expected Discharge Plan and Services Expected Discharge Plan: [place] In-house Referral: Clinical Social Work Discharge Planning Services: CM Consult Post Acute Care Choice: [place] Living arrangements for the past 2 months: [place]: [place] Date [place]: 01/23/22 Time [place]:  [phone] Representative spoke with at [place]: [place] ([place]) Interventions    Readmission Risk Interventions    08/27/2021   10:16 AM  Readmission Risk Prevention Plan  Transportation Screening Complete  PCP or Specialist Appt within 3-5 Days Complete  HRI or [place] Complete  Social Work Consult for [place] Planning/Counseling Complete  Palliative Care Screening Not Applicable  Medication Review Press photographer) Complete

## 2022-01-28 NOTE — NC FL2 (Signed)
[place] LEVEL OF CARE FORM     IDENTIFICATION  Patient Name: Kim Burns Birthdate: [birth year] Sex: female Admission Date (Current Location): 01/21/2022  [facility] and [place] Number:  Herbalist and Address:  [facility], [place] [address] [place], [place] [address]      Provider Number: [id number]  Attending Physician Name and Address:  Thurnell Lose, MD  Relative Name and Phone Number:  Jeanmarie, Mccowen [phone]) [phone] [facility])    Current Level of Care: Hospital Recommended Level of Care: [place] Prior Approval Number:    Date Approved/Denied:   PASRR Number: [id number]  Discharge Plan: SNF    Current Diagnoses: Patient Active Problem List   Diagnosis Date Noted   Hematoma of left kidney 01/22/2022   Kidney, perinephric abscess 11/02/2021   Bacteremia due to Streptococcus 11/01/2021   Lactic acidosis 10/30/2021   Fever 10/30/2021   Severe sepsis ([place]) 10/29/2021   Hydronephrosis of left kidney 10/29/2021   Chronic diastolic CHF (congestive heart failure) ([place]) 10/29/2021   Coronary artery disease involving native coronary artery of native heart without angina pectoris 10/29/2021   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 ([place]) 10/29/2021   Chest pain of uncertain etiology    Pulmonary nodule 08/26/2021   Paroxysmal atrial fibrillation ([place]) 08/26/2021   ESRD (end stage renal disease) on dialysis ([place]) 08/25/2021   Renal cell carcinoma of left kidney ([place]) 08/22/2021   Diabetes mellitus type 2, controlled ([place]) 11/14/2016   Hypokalemia 11/12/2016   Melena    Symptomatic anemia    Acute renal failure (ARF) (HCC) 10/03/2016   GERD (gastroesophageal reflux disease) 04/29/2016   Hyperglycemia 04/29/2016   IPMN (intraductal papillary mucinous neoplasm) 04/23/2016   History of stroke 04/16/2016   Sepsis ([place]) 06/18/2013   Localized swelling, mass and lump, neck 03/29/2013   Sinus  bradycardia 09/18/2012   Fatigue 09/18/2012    Orientation RESPIRATION BLADDER Height & Weight     Self, Time, Situation, Place  Normal Continent Weight: 126 lb 12.2 oz (57.5 kg) Height:  '5\' 2"'$  (157.5 cm)  BEHAVIORAL SYMPTOMS/MOOD NEUROLOGICAL BOWEL NUTRITION STATUS      Continent Diet  AMBULATORY STATUS COMMUNICATION OF NEEDS Skin   Extensive Assist Verbally Normal                       Personal Care Assistance Level of Assistance  Bathing, Feeding, Dressing Bathing Assistance: Limited assistance Feeding assistance: Independent Dressing Assistance: Limited assistance     Functional Limitations Info  Sight, Hearing, Speech Sight Info: Adequate Hearing Info: Adequate Speech Info: Adequate    SPECIAL CARE FACTORS FREQUENCY  PT (By licensed PT), OT (By licensed OT)     PT Frequency: 5x per week OT Frequency: 5x per week            Contractures Contractures Info: Not present    Additional Factors Info  Code Status, Allergies Code Status Info: full code Allergies Info: lotemax, vibra-tab, amolodipine, hydrochodone, lisinipril, verapamil           Current Medications (01/28/2022):  This is the current hospital active medication list Current Facility-Administered Medications  Medication Dose Route Frequency Provider Last Rate Last Admin   acetaminophen (TYLENOL) tablet 650 mg  650 mg Oral Q6H PRN Kayleen Memos, DO   650 mg at 01/28/22 1941   amiodarone (PACERONE) tablet 100 mg  100 mg Oral Daily Thurnell Lose, MD   100 mg at 01/28/22  0813   carvedilol (COREG) tablet 3.125 mg  3.125 mg Oral BID WC Thurnell Lose, MD   3.125 mg at 01/27/22 1648   cephALEXin (KEFLEX) capsule 500 mg  500 mg Oral Q24H Thurnell Lose, MD   500 mg at 01/27/22 1946   cycloSPORINE (RESTASIS) 0.05 % ophthalmic emulsion 1 drop  1 drop Both Eyes BID Thurnell Lose, MD   1 drop at 01/28/22 2956   Darbepoetin Alfa (ARANESP) injection 100 mcg  100 mcg Subcutaneous Q Thu-1800  Loren Racer, PA-C   100 mcg at 01/24/22 2037   dextrose 50 % solution 50 mL  1 ampule Intravenous Q1H PRN Howerter, Justin B, DO       feeding supplement (NEPRO CARB STEADY) liquid 237 mL  237 mL Oral BID BM Loren Racer, PA-C   237 mL at 01/27/22 1128   ferrous sulfate tablet 325 mg  325 mg Oral BID WC Thurnell Lose, MD   325 mg at 21/30/86 5784   folic acid (FOLVITE) tablet 1 mg  1 mg Oral Daily Thurnell Lose, MD   1 mg at 01/28/22 0813   hydrALAZINE (APRESOLINE) injection 10 mg  10 mg Intravenous Q6H PRN Thurnell Lose, MD       hydrALAZINE (APRESOLINE) tablet 50 mg  50 mg Oral Q8H Thurnell Lose, MD   50 mg at 01/27/22 2134   insulin aspart (novoLOG) injection 0-6 Units  0-6 Units Subcutaneous TID WC Thurnell Lose, MD       lipase/protease/amylase (CREON) capsule 36,000 Units  36,000 Units Oral BID WC Thurnell Lose, MD   36,000 Units at 01/28/22 0736   melatonin tablet 5 mg  5 mg Oral QHS Thurnell Lose, MD   5 mg at 01/27/22 2134   multivitamin (RENA-VIT) tablet 1 tablet  1 tablet Oral QHS Loren Racer, PA-C   1 tablet at 01/27/22 2134   oxyCODONE (Oxy IR/ROXICODONE) immediate release tablet 5 mg  5 mg Oral Q6H PRN Irene Pap N, DO   5 mg at 01/26/22 1002   pantoprazole (PROTONIX) EC tablet 40 mg  40 mg Oral Daily Thurnell Lose, MD   40 mg at 01/28/22 0813   polyethylene glycol (MIRALAX / GLYCOLAX) packet 17 g  17 g Oral Daily PRN Irene Pap N, DO   17 g at 01/23/22 0841   prochlorperazine (COMPAZINE) injection 5 mg  5 mg Intravenous Q6H PRN Irene Pap N, DO   5 mg at 01/25/22 1215   rOPINIRole (REQUIP) tablet 0.5 mg  0.5 mg Oral BID Thurnell Lose, MD   0.5 mg at 01/28/22 0736   senna (SENOKOT) tablet 8.6 mg  1 tablet Oral QHS Irene Pap N, DO   8.6 mg at 01/27/22 2135   sevelamer carbonate (RENVELA) tablet 1,600 mg  1,600 mg Oral TID WC Loren Racer, PA-C   1,600 mg at 01/28/22 6962     Discharge Medications: Please see  discharge summary for a list of discharge medications.  Relevant Imaging Results:  Relevant Lab Results:   Additional Information SS# [id number]. Pt receives out-pt HD at [facility] on MWF. Pt arrives at 6:20 am for 6:40 am chair time  Benard Halsted, LCSW

## 2022-01-28 NOTE — Progress Notes (Addendum)
Received patient in bed to unit.  Alert and oriented.  Informed consent signed and in chart.   Treatment initiated: 0930 Treatment completed: 1300  Patient tolerated well.  Transported back to the room  Alert, without acute distress.  Hand-off given to patient's nurse.   Access used: AVF Access issues: n/a  Total UF removed: 1000 Medication(s) given: n/a Weight:56.7KG     01/28/22 1300  Vitals  Temp 98.1 F (36.7 C)  Temp Source Oral  BP 128/72  Pulse Rate 83  Resp (!) 25  Oxygen Therapy  SpO2 98 %  O2 Device Room Air  Patient Activity (if Appropriate) In bed  During Treatment Monitoring  Intra-Hemodialysis Comments Tx completed;Tolerated well  Post Treatment  Dialyzer Clearance Lightly streaked  Duration of HD Treatment -hour(s) 3.5 hour(s)  Liters Processed 84  Fluid Removed (mL) 1000 mL  Tolerated HD Treatment Yes  AVG/AVF Arterial Site Held (minutes) 5 minutes  AVG/AVF Venous Site Held (minutes) 5 minutes          [facility]

## 2022-01-29 DIAGNOSIS — S37012A Minor contusion of left kidney, initial encounter: Secondary | ICD-10-CM | POA: Diagnosis not present

## 2022-01-29 LAB — GLUCOSE, CAPILLARY
Glucose-Capillary: 164 mg/dL — ABNORMAL HIGH (ref 70–99)
Glucose-Capillary: 191 mg/dL — ABNORMAL HIGH (ref 70–99)
Glucose-Capillary: 139 mg/dL — ABNORMAL HIGH (ref 70–99)
Glucose-Capillary: 275 mg/dL — ABNORMAL HIGH (ref 70–99)

## 2022-01-29 MED ORDER — CHLORHEXIDINE GLUCONATE CLOTH 2 % EX PADS
6.0000 | MEDICATED_PAD | Freq: Every day | CUTANEOUS | Status: DC
  Administered 2022-01-30 – 2022-01-31 (×2): 6 via TOPICAL

## 2022-01-29 NOTE — Progress Notes (Signed)
Contacted by [facility] regarding pt's possible resumption date. Clinic advised pt is for snf placement and insurance auth still in the works. Pt will not resume tomorrow. Will provide updates to clinic as needed.   Melven Sartorius Renal Navigator [phone]

## 2022-01-29 NOTE — TOC Progression Note (Signed)
Transition of Care [facility]) - Progression Note    Patient Details  Name: Kim Burns MRN: [id number] Date of Birth: 1935/09/07  [facility] CM/SW [place], LCSW Phone Number: 01/29/2022, 1:30 PM  Clinical Narrative:    Patient's son requesting [facility] as he would rather not change patient's dialysis center. Insurance is requesting an updated therapy note. CSW left vm with rehab office to request.    Expected Discharge Plan: Skilled Nursing Facility Barriers to Discharge: Ship broker, SNF Pending bed offer  Expected Discharge Plan and Services Expected Discharge Plan: [place] In-house Referral: Clinical Social Work Discharge Planning Services: CM Consult Post Acute Care Choice: [place] Living arrangements for the past 2 months: [place]: [place] Date [place]: 01/23/22 Time [place]: [phone] Representative spoke with at [place]: [place] ([place]) Interventions    Readmission Risk Interventions    08/27/2021   10:16 AM  Readmission Risk Prevention Plan  Transportation Screening Complete  PCP or Specialist Appt within 3-5 Days Complete  HRI or [place] Complete  Social Work Consult for [place] Planning/Counseling Complete  Palliative Care Screening Not Applicable  Medication Review Press photographer) Complete

## 2022-01-29 NOTE — Progress Notes (Signed)
Physical Therapy Treatment Patient Details Name: Kim Burns MRN: [id number] DOB: 04-Nov-1935 Today's Date: 01/29/2022   History of Present Illness Patient is an 86 year old female presents with severe back pain found to have subcapsular hematoma. See was having intermittent hematuria x3 wk,returned from a trip from [place] 12/4 and started having severe diffuse back pain during night so came to the ED.PMH significant for renal cell mass status post nephrostomy tube, ureteral stent, ESRD on HD MWF PAF on Eliquis, chronic diastolic CHF nonobstructive CAD, previous CVA.    PT Comments    Patient reports improved energy today compared to the last few days. Eager to work with therapy. Pt was able to transfer supine>sit and sit>stand with Min assist and cues for safety. No LOB with rise and pt steady with UE support of walker. Pt tolerated increased gait distance today to ~150' however required 3 standing rest breaks due to fatigue. EOS pt resting in recliner and breakfast tray in room and set up for pt. Continue to recommend follow up skilled PT at SNF setting to improve functional independence prior to return home.    Recommendations for follow up therapy are one component of a multi-disciplinary discharge planning process, led by the attending physician.  Recommendations may be updated based on patient status, additional functional criteria and insurance authorization.  Follow Up Recommendations  Skilled nursing-short term rehab (<3 hours/day) Can patient physically be transported by private vehicle: No   Assistance Recommended at Discharge Frequent or constant Supervision/Assistance  Patient can return home with the following A little help with walking and/or transfers;A little help with bathing/dressing/bathroom;Assistance with cooking/housework;Direct supervision/assist for medications management;Assist for transportation;Help with stairs or ramp for entrance   Equipment Recommendations   None recommended by PT    Recommendations for Other Services       Precautions / Restrictions Precautions Precautions: Fall Restrictions Weight Bearing Restrictions: No     Mobility  Bed Mobility Overal bed mobility: Needs Assistance Bed Mobility: Supine to Sit     Supine to sit: HOB elevated, Min guard     General bed mobility comments: guarding for safety with supine>sit, cues to use bed rail    Transfers Overall transfer level: Needs assistance Equipment used: Rolling walker (2 wheels) Transfers: Sit to/from Stand Sit to Stand: Min assist           General transfer comment: Assist to power up and steady once standing. pt required cues to reach back to control lower to sit.    Ambulation/Gait Ambulation/Gait assistance: Min assist Gait Distance (Feet): 150 Feet Assistive device: Rolling walker (2 wheels) Gait Pattern/deviations: Step-through pattern, Decreased stride length, Shuffle, Trunk flexed Gait velocity: decr     General Gait Details: Cues throughout for safe position of RW, pt improved throughout gait with cues to keep walker close.min assist intermittently. pt required 2 standing rest breaks due to fatigue throughout gait.   Stairs             Wheelchair Mobility    Modified Rankin (Stroke Patients Only)       Balance Overall balance assessment: Needs assistance Sitting-balance support: Feet supported Sitting balance-Leahy Scale: Fair     Standing balance support: During functional activity, Single extremity supported Standing balance-Leahy Scale: Poor Standing balance comment: reliant on UE support                            Cognition Arousal/Alertness: Awake/alert Behavior During Therapy: [facility] for  tasks assessed/performed Overall Cognitive Status: Within Functional Limits for tasks assessed                                 General Comments: pt reports feeling better, no major headache today, no  dizziness        Exercises      General Comments        Pertinent Vitals/Pain Pain Assessment Pain Assessment: Faces Faces Pain Scale: Hurts a little bit Pain Location: headache Pain Descriptors / Indicators: Headache Pain Intervention(s): Limited activity within patient's tolerance, Monitored during session, Repositioned    Home Living                          Prior Function            PT Goals (current goals can now be found in the care plan section) Acute Rehab PT Goals Patient Stated Goal: get stronger and eventually home PT Goal Formulation: With patient Time For Goal Achievement: 02/06/22 Potential to Achieve Goals: Good Progress towards PT goals: Progressing toward goals    Frequency    Min 2X/week      PT Plan Current plan remains appropriate    Co-evaluation              AM-PAC PT "6 Clicks" Mobility   Outcome Measure  Help needed turning from your back to your side while in a flat bed without using bedrails?: A Little Help needed moving from lying on your back to sitting on the side of a flat bed without using bedrails?: A Little Help needed moving to and from a bed to a chair (including a wheelchair)?: A Little Help needed standing up from a chair using your arms (e.g., wheelchair or bedside chair)?: A Little Help needed to walk in hospital room?: A Little Help needed climbing 3-5 steps with a railing? : Total 6 Click Score: 16    End of Session Equipment Utilized During Treatment: Gait belt Activity Tolerance: Patient tolerated treatment well Patient left: with call bell/phone within reach;in chair;with chair alarm set Nurse Communication: Mobility status;Other (comment) PT Visit Diagnosis: Muscle weakness (generalized) (M62.81);Difficulty in walking, not elsewhere classified (R26.2);Unsteadiness on feet (R26.81)     Time: 6962-9528 PT Time Calculation (min) (ACUTE ONLY): 20 min  Charges:  $Gait Training: 8-22 mins                      Verner Mould, DPT Acute Rehabilitation Services Office [phone]  01/29/22 12:32 PM

## 2022-01-29 NOTE — Progress Notes (Signed)
[facility] Progress Note   Subjective:   Says her fruit had mold on it this AM, she is upset. Denies SOB at rest but reports dyspnea when walking with PT and still feels weak. No CP or dizziness. Waiting for SNF.   Objective Vitals:   01/28/22 1934 01/28/22 2347 01/29/22 0325 01/29/22 0820  BP: 123/63 (!) 107/58 139/69 (!) 154/74  Pulse: 87 84 87 84  Resp: '12 18 16 17  '$ Temp: 98.1 F (36.7 C) 98.2 F (36.8 C) 98.4 F (36.9 C) 98.4 F (36.9 C)  TempSrc: Oral Oral Oral Oral  SpO2: 97% 95% 97% 96%  Weight:      Height:       Physical Exam General: Alert female in NAD Heart: RRR, no murmurs, rubs or gallops Lungs: CTA bilaterally without wheezing, rhonchi or rales Abdomen: Soft, non-distended, +BS Extremities: No edema b/l lower extremities Dialysis Access:  LUE AVF accessed  Additional Objective Labs: Basic Metabolic Panel: Recent Labs  Lab 01/25/22 0327 01/26/22 0230 01/28/22 0318  NA 134* 134* 131*  K 3.4* 3.6 3.8  CL 95* 95* 93*  CO2 '28 26 26  '$ GLUCOSE 100* 142* 141*  BUN 20 25* 21  CREATININE 3.31* 4.42* 4.42*  CALCIUM 8.1* 8.4* 8.6*  PHOS  --   --  3.6   Liver Function Tests: Recent Labs  Lab 01/28/22 0318  ALBUMIN 2.2*   No results for input(s): "LIPASE", "AMYLASE" in the last 168 hours. CBC: Recent Labs  Lab 01/23/22 1224 01/23/22 1224 01/24/22 0736 01/25/22 0327 01/26/22 0230 01/28/22 0318  WBC 11.7*  --  12.4* 10.7* 8.9 9.1  NEUTROABS  --    < > 10.5* 9.2* 7.0 7.2  HGB 8.1*  --  8.4* 8.0* 8.3* 9.1*  HCT 24.0*  --  25.8* 24.1* 25.0* 28.1*  MCV 87.0  --  89.3 88.9 89.3 90.1  PLT 165  --  212 194 241 345   < > = values in this interval not displayed.   Blood Culture    Component Value Date/Time   SDES BLOOD RIGHT ANTECUBITAL 01/22/2022 0038   SPECREQUEST  01/22/2022 0038    BOTTLES DRAWN AEROBIC AND ANAEROBIC Blood Culture results may not be optimal due to an inadequate volume of blood received in culture bottles   CULT   01/22/2022 0038    NO GROWTH 5 DAYS Performed at [facility], [place] [address], [place], [place] [address]    REPTSTATUS 01/27/2022 FINAL 01/22/2022 0038    Cardiac Enzymes: No results for input(s): "CKTOTAL", "CKMB", "CKMBINDEX", "TROPONINI" in the last 168 hours. CBG: Recent Labs  Lab 01/28/22 0803 01/28/22 1426 01/28/22 1613 01/28/22 2213 01/29/22 0759  GLUCAP 207* 98 211* 148* 164*   Iron Studies: No results for input(s): "IRON", "TIBC", "TRANSFERRIN", "FERRITIN" in the last 72 hours. '@lablastinr3'$ @ Studies/Results: No results found. Medications:   amiodarone  100 mg Oral Daily   carvedilol  3.125 mg Oral BID WC   cycloSPORINE  1 drop Both Eyes BID   darbepoetin (ARANESP) injection - NON-DIALYSIS  100 mcg Subcutaneous Q Thu-1800   feeding supplement (NEPRO CARB STEADY)  237 mL Oral BID BM   ferrous sulfate  325 mg Oral BID WC   folic acid  1 mg Oral Daily   hydrALAZINE  50 mg Oral Q8H   insulin aspart  0-6 Units Subcutaneous TID WC   lipase/protease/amylase  36,000 Units Oral TID WC   melatonin  5 mg Oral QHS  multivitamin  1 tablet Oral QHS   pantoprazole  40 mg Oral Daily   rOPINIRole  0.5 mg Oral BID   senna  1 tablet Oral QHS   sevelamer carbonate  1,600 mg Oral TID WC    Dialysis Orders: MWF at [facility] 3:45hr, 350/A1.5, EDW 58kg, 2K/2Ca, AVF, 16g needles, no heparin - Calcitriol 1.80mg PO q HD - No recent ESA - was on hold for Hgb 12.9 on 01/16/22 - Binders: Renvela 2/meals.  Assessment/Plan:  L renal hematoma: In setting of prior urinoma s/p RCC ablation. Hematoma is not communicating with current JP drain as her bag is not bloody.  Urology and IR involved, no further treatment at this time. Considering embolizing the L kidney in the future. Sepsis: WBC and LA very high on admit, started on IV Cefepime - both improved, will finish course PO Keflex.  ESRD: Usual MWF schedule, next HD tomorrow  Hypertension/volume: BP stable, slightly below  prior dry weight. No volume overload on exam   Anemia of ESRD + ABLA: Hgb dropped from 12.9 last week ->7.5 on admit. 2U PRBCs given 12/5. Eliquis on hold. Hgb now stablizing, up to 9.1. Aranesp 1025m given 12/7.  Metabolic bone disease: CorrCa high, Phos high. Hold VDRA for now, resumed home binders.  Nutrition:  Alb low, she dislikes pro-source supplement. Appetite low, adding rena-vite.  T2DM  A-fib: Home Eliquis on hold -> plan to stay off for at least 3 weeks then re-eval per notes.  SaAnice PaganiniPA-C 01/29/2022, 11:32 AM  [facility] Pager: [phone]

## 2022-01-29 NOTE — Plan of Care (Signed)

## 2022-01-29 NOTE — Progress Notes (Signed)
PROGRESS NOTE                                                                                                                                                                                                             Patient Demographics:    Kim Burns, is a 86 y.o. female, DOB - 08-14-1935, BJS:[id number]  Outpatient Primary MD for the patient is [name], MD    LOS - 7  Admit date - 01/21/2022    Chief Complaint  Patient presents with   Back Pain       Brief Narrative (HPI from H&P)   86 year old female history of renal cell mass status post nephrostomy tube, ureteral stent, ESRD on HD MWF PAF on Eliquis, chronic diastolic CHF nonobstructive CAD, previous CVA presents with severe back pain found to have subcapsular hematoma.   See was having intermittent hematuria x3 wk,returned from a trip from [place] 12/4 and started having severe diffuse back pain during night so came to the ED.   In the ED had abnormal EKG with subtle inferior ST elevation and ST depression in lateral lead EDP discussed with on-call cardiology Dr. Irish Lack patient generally clean cath in July 2023 with 20% stenosis of RCA-advised holding off on cath and continue to repeat EKG follow-up troponins> +/- heparin if significant bump in troponin.  Labs showed significant leukocytosis, elevated creatinine 6.4, troponin 25 > 28 hemoglobin 9.0> 9.5>7.,5 blood glucose 425, lactic acidosis 5.7> 5.4> 4.3.CT renal study showed large left renal subcapsular hematoma extending into the perirenal space left pigtail ureteral stent in place, no hydronephrosis, urology  Dr Cain Sieve was consulted> advised that she does  Kcentra was given to reverse Eliquis.  Patient was started on IV antibiotics blood cultures and also start gentle IV fluids and admitted.   Subjective:   Patient in bed, appears comfortable, denies any headache, no fever, no chest pain or pressure,  no shortness of breath , much improved left-sided flank/abdominal pain. No new focal weakness.   Assessment  & Plan :   Large left renal subcapsular hematoma. Intermittent hematuria x3 weeks. S/p left tumor ablation c/b urine leak-refractory to management and with an indwelling stent and nephrostomy tube: Hemoglobin drifting down, has been on movement and exam. She is s/p 2 units of packed RBC transfusion, also received Kcentra on 01/22/2022, bleeding seems  to have held for now.  Seen by urology and IR.  CTA on 01/23/2022 shows no replete, per IR monitor.  Case discussed in detail with Dr. Louis Meckel patient's urologist on 01/24/2022, 01/25/2022, who wants to hold anticoagulation for at least 3 weeks thereafter cautious retrial of anticoagulation.  Patient understands the risks of holding Coreg along with benefits, agrees to hold it, currently no role for nephrectomy as the risk of procedure outweighs the benefits.  Please CBC trending up, left-sided flank pain better, no signs of ongoing active bleeding, stable for discharge, patient and family have now decided that they would like to go to SNF and not home with home health.  TOC looking for appropriate placement.  Acute blood loss anemia, anemia of chronic renal disease:  see above.  Suspected Severe Sepsis POA-given patient's lactic acidosis/leukocytosis, possible intra-abdominal source. History of left perinephric abscess: Clinically stable blood cultures negative, sepsis pathophysiology has resolved, tapered down antibiotics on 01/25/2022.  ESRD on HD MWF: Nephrology following .  Previous CVA, PAF on Eliquis: Eliquis discontinued,but will need to d/w risks/benefits;given her stroke history she is high risk w/ CHA2DS2-VASc Score = 7,  this was explained to patient and patient's son [name].  Upon family request cardiology team consulted who agreed with our management and signed off on 01/23/2022, home dose amiodarone continued, Coreg as in #1  Chronic  diastolic CHF, EF 84% on recent echocardiogram: Compensated now.  Hypertension - blood pressure is improved to reintroduced Coreg.  As needed hydralazine as well.  Type 2 diabetes mellitus with uncontrolled hyperglycemia, on long-term insulin: Continue SSI and monitor   CBG (last 3)  Recent Labs    01/28/22 1613 01/28/22 2213 01/29/22 0759  GLUCAP 211* 148* 164*        Condition - Extremely Guarded  Family Communication  :    [name] [phone]  - 01/23/22, 01/24/22, 01/26/2022  Code Status :  Full  Consults  :  Urology, PCCM , IR, Renal, Cardiology  PUD Prophylaxis : PPI   Procedures  :     CT - 1. Large left renal subcapsular hematoma extending into the perirenal space. 2. Similar positioning of the percutaneous drainage catheter and left pigtail ureteral stent. No hydronephrosis. 3. No bowel obstruction. 4. Partially visualized small left pleural effusion. 5.  Aortic Atherosclerosis.      Disposition Plan  :    Status is: Inpatient  DVT Prophylaxis  :    Place and maintain sequential compression device Start: 01/23/22 0601 SCDs Start: 01/22/22 0227    Lab Results  Component Value Date   PLT 345 01/28/2022    Diet :  Diet Order             Diet regular Room service appropriate? Yes; Fluid consistency: Thin  Diet effective now                    Inpatient Medications  Scheduled Meds:  amiodarone  100 mg Oral Daily   carvedilol  3.125 mg Oral BID WC   cycloSPORINE  1 drop Both Eyes BID   darbepoetin (ARANESP) injection - NON-DIALYSIS  100 mcg Subcutaneous Q Thu-1800   feeding supplement (NEPRO CARB STEADY)  237 mL Oral BID BM   ferrous sulfate  325 mg Oral BID WC   folic acid  1 mg Oral Daily   hydrALAZINE  50 mg Oral Q8H   insulin aspart  0-6 Units Subcutaneous TID WC   lipase/protease/amylase  36,000 Units Oral  TID WC   melatonin  5 mg Oral QHS   multivitamin  1 tablet Oral QHS   pantoprazole  40 mg Oral Daily   rOPINIRole  0.5 mg  Oral BID   senna  1 tablet Oral QHS   sevelamer carbonate  1,600 mg Oral TID WC   Continuous Infusions:   PRN Meds:.acetaminophen, dextrose, hydrALAZINE, oxyCODONE, polyethylene glycol, prochlorperazine       Objective:   Vitals:   01/28/22 1934 01/28/22 2347 01/29/22 0325 01/29/22 0820  BP: 123/63 (!) 107/58 139/69 (!) 154/74  Pulse: 87 84 87 84  Resp: '12 18 16 17  '$ Temp: 98.1 F (36.7 C) 98.2 F (36.8 C) 98.4 F (36.9 C) 98.4 F (36.9 C)  TempSrc: Oral Oral Oral Oral  SpO2: 97% 95% 97% 96%  Weight:      Height:        Wt Readings from Last 3 Encounters:  01/28/22 56.7 kg  11/23/21 58.8 kg  11/07/21 58.5 kg     Intake/Output Summary (Last 24 hours) at 01/29/2022 0900 Last data filed at 01/29/2022 0600 Gross per 24 hour  Intake 60 ml  Output 1451 ml  Net -1391 ml     Physical Exam  Awake Alert, No new F.N deficits, left external nephrostomy tube/drain in place [place].AT,PERRAL Supple Neck, No JVD,   Symmetrical Chest wall movement, Good air movement bilaterally, CTAB RRR,No Gallops, Rubs or new Murmurs,  +ve B.Sounds, Abd Soft, No tenderness,   No Cyanosis, Clubbing or edema      Data Review:    Recent Labs  Lab 01/23/22 1224 01/24/22 0736 01/25/22 0327 01/26/22 0230 01/28/22 0318  WBC 11.7* 12.4* 10.7* 8.9 9.1  HGB 8.1* 8.4* 8.0* 8.3* 9.1*  HCT 24.0* 25.8* 24.1* 25.0* 28.1*  PLT 165 212 194 241 345  MCV 87.0 89.3 88.9 89.3 90.1  MCH 29.3 29.1 29.5 29.6 29.2  MCHC 33.8 32.6 33.2 33.2 32.4  RDW 17.6* 17.5* 17.5* 17.2* 17.6*  LYMPHSABS  --  1.2 0.8 0.9 0.9  MONOABS  --  0.6 0.6 0.6 0.8  EOSABS  --  0.0 0.1 0.2 0.2  BASOSABS  --  0.1 0.0 0.0 0.0    Recent Labs  Lab 01/22/22 1243 01/22/22 1257 01/22/22 1745 01/23/22 0422 01/24/22 0736 01/25/22 0327 01/26/22 0230 01/28/22 0318  NA  --    < >  --  137 135 134* 134* 131*  K  --    < >  --  3.7 4.2 3.4* 3.6 3.8  CL  --   --   --  96* 98 95* 95* 93*  CO2  --   --   --  '26 23 28 26 26   '$ GLUCOSE  --   --   --  129* 154* 100* 142* 141*  BUN  --   --   --  38* 50* 20 25* 21  CREATININE  --   --   --  4.70* 6.06* 3.31* 4.42* 4.42*  ALBUMIN  --   --   --   --   --   --   --  2.2*  LATICACIDVEN 3.5*  --  3.0* 1.5  --   --   --   --   CALCIUM  --   --   --  9.0 8.6* 8.1* 8.4* 8.6*   < > = values in this interval not displayed.      Radiology Reports No results found.    Signature  -  Lala Lund M.D on 01/29/2022 at 9:00 AM   -  To page go to [email or web address]

## 2022-01-30 ENCOUNTER — Inpatient Hospital Stay (HOSPITAL_COMMUNITY): Payer: Medicare PPO

## 2022-01-30 ENCOUNTER — Other Ambulatory Visit: Payer: Medicare PPO

## 2022-01-30 DIAGNOSIS — Z992 Dependence on renal dialysis: Secondary | ICD-10-CM | POA: Diagnosis not present

## 2022-01-30 DIAGNOSIS — S37012D Minor contusion of left kidney, subsequent encounter: Secondary | ICD-10-CM | POA: Diagnosis not present

## 2022-01-30 DIAGNOSIS — I480 Paroxysmal atrial fibrillation: Secondary | ICD-10-CM | POA: Diagnosis not present

## 2022-01-30 DIAGNOSIS — N186 End stage renal disease: Secondary | ICD-10-CM | POA: Diagnosis not present

## 2022-01-30 LAB — CBC WITH DIFFERENTIAL/PLATELET
Abs Immature Granulocytes: 0.08 10*3/uL — ABNORMAL HIGH (ref 0.00–0.07)
Basophils Absolute: 0 10*3/uL (ref 0.0–0.1)
Basophils Relative: 0 %
Eosinophils Absolute: 0.2 10*3/uL (ref 0.0–0.5)
Eosinophils Relative: 2 %
HCT: 29.7 % — ABNORMAL LOW (ref 36.0–46.0)
Hemoglobin: 9.8 g/dL — ABNORMAL LOW (ref 12.0–15.0)
Immature Granulocytes: 1 %
Lymphocytes Relative: 10 %
Lymphs Abs: 1.1 10*3/uL (ref 0.7–4.0)
MCH: 29.2 pg (ref 26.0–34.0)
MCHC: 33 g/dL (ref 30.0–36.0)
MCV: 88.4 fL (ref 80.0–100.0)
Monocytes Absolute: 0.9 10*3/uL (ref 0.1–1.0)
Monocytes Relative: 9 %
Neutro Abs: 8.1 10*3/uL — ABNORMAL HIGH (ref 1.7–7.7)
Neutrophils Relative %: 78 %
Platelets: 431 10*3/uL — ABNORMAL HIGH (ref 150–400)
RBC: 3.36 MIL/uL — ABNORMAL LOW (ref 3.87–5.11)
RDW: 18.1 % — ABNORMAL HIGH (ref 11.5–15.5)
WBC: 10.4 10*3/uL (ref 4.0–10.5)
nRBC: 0 % (ref 0.0–0.2)

## 2022-01-30 LAB — RENAL FUNCTION PANEL
Albumin: 2.3 g/dL — ABNORMAL LOW (ref 3.5–5.0)
Anion gap: 12 (ref 5–15)
BUN: 19 mg/dL (ref 8–23)
CO2: 26 mmol/L (ref 22–32)
Calcium: 8.5 mg/dL — ABNORMAL LOW (ref 8.9–10.3)
Chloride: 93 mmol/L — ABNORMAL LOW (ref 98–111)
Creatinine, Ser: 4.55 mg/dL — ABNORMAL HIGH (ref 0.44–1.00)
GFR, Estimated: 9 mL/min — ABNORMAL LOW (ref >60)
Glucose, Bld: 160 mg/dL — ABNORMAL HIGH (ref 70–99)
Phosphorus: 3.3 mg/dL (ref 2.5–4.6)
Potassium: 4 mmol/L (ref 3.5–5.1)
Sodium: 131 mmol/L — ABNORMAL LOW (ref 135–145)

## 2022-01-30 LAB — GLUCOSE, CAPILLARY
Glucose-Capillary: 197 mg/dL — ABNORMAL HIGH (ref 70–99)
Glucose-Capillary: 159 mg/dL — ABNORMAL HIGH (ref 70–99)
Glucose-Capillary: 159 mg/dL — ABNORMAL HIGH (ref 70–99)
Glucose-Capillary: 224 mg/dL — ABNORMAL HIGH (ref 70–99)

## 2022-01-30 MED ORDER — PANTOPRAZOLE SODIUM 40 MG PO TBEC: 40.0000 mg | DELAYED_RELEASE_TABLET | Freq: Every day | ORAL | 0 refills | Status: DC

## 2022-01-30 MED ORDER — TRAMADOL HCL 50 MG PO TABS: 50.0000 mg | ORAL_TABLET | Freq: Two times a day (BID) | ORAL | 0 refills | Status: DC | PRN

## 2022-01-30 MED ORDER — FERROUS SULFATE 325 (65 FE) MG PO TABS: 325.0000 mg | ORAL_TABLET | Freq: Every day | ORAL | 3 refills | Status: DC

## 2022-01-30 MED ORDER — POLYETHYLENE GLYCOL 3350 17 G PO PACK: 17.0000 g | PACK | Freq: Every day | ORAL | 0 refills | Status: DC | PRN

## 2022-01-30 MED ORDER — HYDRALAZINE HCL 50 MG PO TABS: 50.0000 mg | ORAL_TABLET | Freq: Three times a day (TID) | ORAL | Status: DC

## 2022-01-30 MED ORDER — INSULIN ASPART 100 UNIT/ML IJ SOLN: 0.0000 [IU] | Freq: Three times a day (TID) | INTRAMUSCULAR | 11 refills | Status: DC

## 2022-01-30 MED ORDER — SENNA 8.6 MG PO TABS: 1.0000 | ORAL_TABLET | Freq: Every day | ORAL | 0 refills | Status: DC

## 2022-01-30 MED ORDER — NEPRO/CARBSTEADY PO LIQD: 237.0000 mL | Freq: Two times a day (BID) | ORAL | 0 refills | Status: DC

## 2022-01-30 MED ORDER — ACETAMINOPHEN 325 MG PO TABS: 650.0000 mg | ORAL_TABLET | Freq: Four times a day (QID) | ORAL | Status: DC | PRN

## 2022-01-30 MED ORDER — FOLIC ACID 1 MG PO TABS: 1.0000 mg | ORAL_TABLET | Freq: Every day | ORAL | Status: DC

## 2022-01-30 NOTE — TOC Progression Note (Addendum)
Transition of Care [facility]) - Progression Note    Patient Details  Name: Kim Burns MRN: [id number] Date of Birth: [birth year]  Transition of Care [facility]) CM/SW [place], LCSW Phone Number: 01/30/2022, 10:05 AM  Clinical Narrative:    10am-Insurance requesting updated PT note by 1:30pm today. CSW contacted PT office again and they will assign someone.   11:30a-PT published note. CSW uploaded to [facility] for review.   12:08pm-CSW received insurance approval for Rogelia Boga, Ref# [id number], effective 12/13-12/15/23.  CSW updated patient's son, Ronalee Belts Jeneen Rinks). He stated he can transport patient after 4pm today. CSW will let him know once patient arrives back from dialysis.    Expected Discharge Plan: Skilled Nursing Facility Barriers to Discharge: Ship broker, SNF Pending bed offer  Expected Discharge Plan and Services Expected Discharge Plan: [place] In-house Referral: Clinical Social Work Discharge Planning Services: CM Consult Post Acute Care Choice: [place] Living arrangements for the past 2 months: [place]: [place] Date [place]: 01/23/22 Time [place]: [phone] Representative spoke with at [place]: [place] ([place]) Interventions    Readmission Risk Interventions    08/27/2021   10:16 AM  Readmission Risk Prevention Plan  Transportation Screening Complete  PCP or Specialist Appt within 3-5 Days Complete  HRI or [place] Complete  Social Work Consult for [place] Planning/Counseling Complete  Palliative Care Screening Not Applicable  Medication Review Press photographer) Complete

## 2022-01-30 NOTE — Progress Notes (Signed)
Discharge on hold as patient is due for ct scan.

## 2022-01-30 NOTE — Progress Notes (Addendum)
Received patient in bed to unit.  Alert and oriented.  Informed consent signed and in chart.   Treatment initiated: [place] Treatment completed: 1443  Patient tolerated well.  Transported back to the room  Alert, without acute distress.  Hand-off given to patient's nurse.   Access used: Left AVF Access issues: none  Total UF removed: 1L Medication(s) given: none    Post weight:55.5kg    01/30/22 1654  Vitals  Temp 98.1 F (36.7 C)  Temp Source Oral  BP 114/64  MAP (mmHg) 83  BP Location Right Arm  BP Method Automatic  Patient Position (if appropriate) Lying  Pulse Rate 83  Pulse Rate Source Monitor  Oxygen Therapy  O2 Device Room Air  During Treatment Monitoring  Blood Flow Rate (mL/min) 400 mL/min  Arterial Pressure (mmHg) -20 mmHg  Venous Pressure (mmHg) 230 mmHg  TMP (mmHg) -3 mmHg  Ultrafiltration Rate (mL/min) 484 mL/min  Dialysate Flow Rate (mL/min) 300 ml/min  HD Safety Checks Performed Yes  Intra-Hemodialysis Comments Tx completed  Post Treatment  Dialyzer Clearance Lightly streaked  Duration of HD Treatment -hour(s) 3 hour(s)  Liters Processed 71.9  Fluid Removed (mL) 1000 mL  Tolerated HD Treatment Yes  Fistula / Graft Left Upper arm Arteriovenous fistula  Placement Date/Time: 04/24/17 1031   Placed prior to admission: No  Orientation: Left  Access Location: Upper arm  Access Type: (c) Arteriovenous fistula  Site Condition No complications  Fistula / Graft Assessment Present;[place];Bruit  Status Deaccessed  Drainage Description None    Tommaso Cavitt [facility]

## 2022-01-30 NOTE — Progress Notes (Signed)
Occupational Therapy Treatment Patient Details Name: Kim Burns MRN: [id number] DOB: 1935/08/19 Today's Date: 01/30/2022   History of present illness Patient is an 86 year old female presents with severe back pain found to have subcapsular hematoma. See was having intermittent hematuria x3 wk,returned from a trip from [place] 12/4 and started having severe diffuse back pain during night so came to the ED.PMH significant for renal cell mass status post nephrostomy tube, ureteral stent, ESRD on HD MWF PAF on Eliquis, chronic diastolic CHF nonobstructive CAD, previous CVA.   OT comments  Patient in bed upon arrival but states she is feeling better but does have a headache. Patient states she believes the headache is from medicine. Patient demonstrating gains with transfers requiring min guard to stand and supervision to min guard for functional transfers. Discharge recommendation for SNF continues to be appropriate for continued rehab. Acute OT to continue to follow.     Recommendations for follow up therapy are one component of a multi-disciplinary discharge planning process, led by the attending physician.  Recommendations may be updated based on patient status, additional functional criteria and insurance authorization.    Follow Up Recommendations  Skilled nursing-short term rehab (<3 hours/day)     Assistance Recommended at Discharge Frequent or constant Supervision/Assistance  Patient can return home with the following  A little help with walking and/or transfers;A little help with bathing/dressing/bathroom;Assistance with cooking/housework;Direct supervision/assist for medications management;Direct supervision/assist for financial management;Assist for transportation;Help with stairs or ramp for entrance   Equipment Recommendations  None recommended by OT    Recommendations for Other Services      Precautions / Restrictions Precautions Precautions: Fall Restrictions Weight  Bearing Restrictions: No       Mobility Bed Mobility Overal bed mobility: Needs Assistance Bed Mobility: Supine to Sit     Supine to sit: Supervision, HOB elevated     General bed mobility comments: able to get to EOB without assistance    Transfers Overall transfer level: Needs assistance Equipment used: Rolling walker (2 wheels) Transfers: Sit to/from Stand Sit to Stand: Min guard           General transfer comment: min guard to stand and verbal cues for safety     Balance Overall balance assessment: Needs assistance Sitting-balance support: Feet supported Sitting balance-Leahy Scale: Fair     Standing balance support: During functional activity, Single extremity supported Standing balance-Leahy Scale: Poor Standing balance comment: able to stand at sink for self care tasks                           ADL either performed or assessed with clinical judgement   ADL Overall ADL's : Needs assistance/impaired     Grooming: Wash/dry hands;Wash/dry face;Oral care;Supervision/safety;Standing Grooming Details (indicate cue type and reason): at sink         Upper Body Dressing : Set up Upper Body Dressing Details (indicate cue type and reason): to donn gown to cover back     Toilet Transfer: Supervision/safety;Rolling walker (2 wheels) Toilet Transfer Details (indicate cue type and reason): simulated to recliner           General ADL Comments: patient supervision for self care while standing    Extremity/Trunk Assessment              Vision       Perception     Praxis      Cognition Arousal/Alertness: Awake/alert Behavior During Therapy: [facility] for tasks  assessed/performed Overall Cognitive Status: Within Functional Limits for tasks assessed                                 General Comments: reports headache only no dizziness. Believes headache is from medicine        Exercises      Shoulder Instructions        General Comments      Pertinent Vitals/ Pain       Pain Assessment Pain Assessment: Faces Faces Pain Scale: Hurts a little bit Pain Location: headache Pain Descriptors / Indicators: Headache Pain Intervention(s): Limited activity within patient's tolerance, Monitored during session, Repositioned  Home Living                                          Prior Functioning/Environment              Frequency  Min 2X/week        Progress Toward Goals  OT Goals(current goals can now be found in the care plan section)  Progress towards OT goals: Progressing toward goals  Acute Rehab OT Goals Patient Stated Goal: get better OT Goal Formulation: With patient Time For Goal Achievement: 02/06/22 Potential to Achieve Goals: Good ADL Goals Pt Will Perform Upper Body Dressing: with modified independence;sitting Pt Will Perform Lower Body Dressing: with modified independence;sitting/lateral leans;sit to/from stand Pt Will Transfer to Toilet: with modified independence;ambulating;regular height toilet Pt Will Perform Tub/Shower Transfer: Tub transfer;Shower transfer;with modified independence;ambulating;rolling walker;shower seat  Plan Discharge plan needs to be updated    Co-evaluation                 AM-PAC OT "6 Clicks" Daily Activity     Outcome Measure   Help from another person eating meals?: None Help from another person taking care of personal grooming?: A Little Help from another person toileting, which includes using toliet, bedpan, or urinal?: A Little Help from another person bathing (including washing, rinsing, drying)?: A Little Help from another person to put on and taking off regular upper body clothing?: A Little Help from another person to put on and taking off regular lower body clothing?: A Little 6 Click Score: 19    End of Session Equipment Utilized During Treatment: Rolling walker (2 wheels)  OT Visit Diagnosis: Unsteadiness on  feet (R26.81);Muscle weakness (generalized) (M62.81);Pain   Activity Tolerance Patient tolerated treatment well   Patient Left in chair;with call bell/phone within reach;with chair alarm set   Nurse Communication Mobility status        Time: 7169-6789 OT Time Calculation (min): 27 min  Charges: OT General Charges $OT Visit: 1 Visit OT Treatments $Self Care/Home Management : 8-22 mins $Therapeutic Activity: 8-22 mins  Lodema Hong, [place]  Office [place] 01/30/2022, 1:59 PM

## 2022-01-30 NOTE — Progress Notes (Signed)
[facility] Progress Note   Subjective:   Has a headache this AM, otherwise feeling ok. No SOB, CP or dizziness.   Objective Vitals:   01/29/22 1656 01/29/22 1940 01/29/22 2311 01/30/22 0845  BP: (!) 145/79 131/70 134/69 132/69  Pulse: 82 85 83 78  Resp: '19 18 17 18  '$ Temp: 98 F (36.7 C) 98.1 F (36.7 C) 98 F (36.7 C)   TempSrc: Oral Oral Oral   SpO2: 97% 97%  97%  Weight:      Height:       Physical Exam General: Alert female in NAD Heart: RRR, no murmurs, rubs or gallops Lungs: CTA bilaterally without wheezing, rhonchi or rales Abdomen: Soft, non-distended, +BS Extremities: No edema b/l lower extremities Dialysis Access:  LUE AVF + bruit   Additional Objective Labs: Basic Metabolic Panel: Recent Labs  Lab 01/25/22 0327 01/26/22 0230 01/28/22 0318  NA 134* 134* 131*  K 3.4* 3.6 3.8  CL 95* 95* 93*  CO2 '28 26 26  '$ GLUCOSE 100* 142* 141*  BUN 20 25* 21  CREATININE 3.31* 4.42* 4.42*  CALCIUM 8.1* 8.4* 8.6*  PHOS  --   --  3.6   Liver Function Tests: Recent Labs  Lab 01/28/22 0318  ALBUMIN 2.2*   No results for input(s): "LIPASE", "AMYLASE" in the last 168 hours. CBC: Recent Labs  Lab 01/24/22 0736 01/25/22 0327 01/26/22 0230 01/28/22 0318 01/30/22 0501  WBC 12.4* 10.7* 8.9 9.1 10.4  NEUTROABS 10.5* 9.2* 7.0 7.2 8.1*  HGB 8.4* 8.0* 8.3* 9.1* 9.8*  HCT 25.8* 24.1* 25.0* 28.1* 29.7*  MCV 89.3 88.9 89.3 90.1 88.4  PLT 212 194 241 345 431*   Blood Culture    Component Value Date/Time   SDES BLOOD RIGHT ANTECUBITAL 01/22/2022 0038   SPECREQUEST  01/22/2022 0038    BOTTLES DRAWN AEROBIC AND ANAEROBIC Blood Culture results may not be optimal due to an inadequate volume of blood received in culture bottles   CULT  01/22/2022 0038    NO GROWTH 5 DAYS Performed at [facility], [place] [address], [place], [place] [address]    REPTSTATUS 01/27/2022 FINAL 01/22/2022 0038    Cardiac Enzymes: No results for input(s): "CKTOTAL",  "CKMB", "CKMBINDEX", "TROPONINI" in the last 168 hours. CBG: Recent Labs  Lab 01/29/22 0759 01/29/22 1234 01/29/22 1612 01/29/22 2151 01/30/22 0734  GLUCAP 164* 191* 139* 275* 197*   Iron Studies: No results for input(s): "IRON", "TIBC", "TRANSFERRIN", "FERRITIN" in the last 72 hours. '@lablastinr3'$ @ Studies/Results: No results found. Medications:   amiodarone  100 mg Oral Daily   carvedilol  3.125 mg Oral BID WC   Chlorhexidine Gluconate Cloth  6 each Topical Q0600   cycloSPORINE  1 drop Both Eyes BID   darbepoetin (ARANESP) injection - NON-DIALYSIS  100 mcg Subcutaneous Q Thu-1800   feeding supplement (NEPRO CARB STEADY)  237 mL Oral BID BM   ferrous sulfate  325 mg Oral BID WC   folic acid  1 mg Oral Daily   hydrALAZINE  50 mg Oral Q8H   insulin aspart  0-6 Units Subcutaneous TID WC   lipase/protease/amylase  36,000 Units Oral TID WC   melatonin  5 mg Oral QHS   multivitamin  1 tablet Oral QHS   pantoprazole  40 mg Oral Daily   rOPINIRole  0.5 mg Oral BID   senna  1 tablet Oral QHS   sevelamer carbonate  1,600 mg Oral TID WC    Outpatient Dialysis Orders: MWF  at [facility] 3:45hr, 350/A1.5, EDW 58kg, 2K/2Ca, AVF, 16g needles, no heparin - Calcitriol 1.61mg PO q HD - No recent ESA - was on hold for Hgb 12.9 on 01/16/22 - Binders: Renvela 2/meals.  Assessment/Plan:  L renal hematoma: In setting of prior urinoma s/p RCC ablation. Hematoma is not communicating with current JP drain as her bag is not bloody.  Urology and IR involved, no further treatment at this time. Considering embolizing the L kidney in the future. Sepsis: WBC and LA very high on admit, started on IV Cefepime - both improved, will finish course PO Keflex.  ESRD: Usual MWF schedule, next HD today  Hypertension/volume: BP stable, slightly below prior dry weight. No volume overload on exam   Anemia of ESRD + ABLA: Hgb dropped from 12.9 last week ->7.5 on admit. 2U PRBCs given 12/5. Eliquis on hold. Hgb  now stablizing, up to 9.8. Aranesp 1046m given 12/7.  Metabolic bone disease: CorrCa high, Phos high. Hold VDRA for now, resumed home binders.  Nutrition:  Alb low, she dislikes pro-source supplement. Appetite low, adding rena-vite.  T2DM  A-fib: Home Eliquis on hold -> plan to stay off for at least 3 weeks then re-eval per notes.    SaAnice PaganiniPA-C 01/30/2022, 10:14 AM  [facility] Pager: [phone]

## 2022-01-30 NOTE — Consult Note (Signed)
   [facility] Inpatient Consult   01/30/2022  LIZVETTE LIGHTSEY 1935-08-07 [id number]  [employer] [ACO] Patient: [employer]  Primary Care Provider:  [name], MD   Patient screened for hospitalization with noted extreme high risk score for unplanned readmission risk length of stay and to assess for potential [facility] Management service needs for post hospital transition for care coordination.  Review of patient's electronic medical record reveals patient is for a skilled nursing facility level of care. Rounding on unit and patient is currently off the floor, HD.  Plan:  Current plan is for patient transition to [facility] with HD ongoing schedule. Patient needs to be met at a skilled nursing facility level of care for rehab. No [facility] Community Care Coordination needed at this time.   Of note, [facility] Care Management/Population Health does not replace or interfere with any arrangements made by the Inpatient Transition of Care team.  For questions contact:   Natividad Brood, RN BSN [place]  [phone] business mobile phone Toll free office [phone]  *[place]  [phone] Fax number: [phone] [email or web address]

## 2022-01-30 NOTE — Progress Notes (Signed)
PROGRESS NOTE                                                                                                                                                                                                             Patient Demographics:    Kim Burns, is a 86 y.o. female, DOB - 15-Aug-1935, MEQ:[id number]  Outpatient Primary MD for the patient is [name], MD    LOS - 8  Admit date - 01/21/2022    Chief Complaint  Patient presents with   Back Pain       Brief Narrative (HPI from H&P)   86 year old female history of renal cell mass status post nephrostomy tube, ureteral stent, ESRD on HD MWF PAF on Eliquis, chronic diastolic CHF nonobstructive CAD, previous CVA presents with severe back pain found to have subcapsular hematoma.   See was having intermittent hematuria x3 wk,returned from a trip from [place] 12/4 and started having severe diffuse back pain during night so came to the ED.   In the ED had abnormal EKG with subtle inferior ST elevation and ST depression in lateral lead EDP discussed with on-call cardiology Dr. Irish Lack patient generally clean cath in July 2023 with 20% stenosis of RCA-advised holding off on cath and continue to repeat EKG follow-up troponins> +/- heparin if significant bump in troponin.  Labs showed significant leukocytosis, elevated creatinine 6.4, troponin 25 > 28 hemoglobin 9.0> 9.5>7.,5 blood glucose 425, lactic acidosis 5.7> 5.4> 4.3.CT renal study showed large left renal subcapsular hematoma extending into the perirenal space left pigtail ureteral stent in place, no hydronephrosis, urology  Dr Cain Sieve was consulted> advised that she does  Kcentra was given to reverse Eliquis.  Patient was started on IV antibiotics blood cultures and also start gentle IV fluids and admitted.   Subjective:   Patient in bed, appears comfortable, does report some headache today, otherwise no significant  complaints.   Assessment  & Plan :   Large left renal subcapsular hematoma. Intermittent hematuria x3 weeks. S/p left tumor ablation c/b urine leak-refractory to management and with an indwelling stent and nephrostomy tube: Hemoglobin drifting down, has been on movement and exam. She is s/p 2 units of packed RBC transfusion, also received Kcentra on 01/22/2022, bleeding seems to have held for now.  Seen by urology and IR.  CTA on 01/23/2022  shows no replete, per IR monitor.  Case discussed in detail with Dr. Louis Meckel patient's urologist on 01/24/2022, 01/25/2022, who wants to hold anticoagulation for at least 3 weeks thereafter cautious retrial of anticoagulation.  Patient understands the risks of holding Coreg along with benefits, agrees to hold it, currently no role for nephrectomy as the risk of procedure outweighs the benefits.  Please CBC trending up, left-sided flank pain better, no signs of ongoing active bleeding.  Acute blood loss anemia, anemia of chronic renal disease:  see above.  Suspected Severe Sepsis POA-given patient's lactic acidosis/leukocytosis, possible intra-abdominal source. History of left perinephric abscess: Clinically stable blood cultures negative, sepsis pathophysiology has resolved, tapered down antibiotics on 01/25/2022.  ESRD on HD MWF: Nephrology following .  Previous CVA, PAF on Eliquis: Eliquis discontinued,but will need to d/w risks/benefits;given her stroke history she is high risk w/ CHA2DS2-VASc Score = 7,  this was explained to patient and patient's son [name].  Upon family request cardiology team consulted who agreed with our management and signed off on 01/23/2022, home dose amiodarone continued, Coreg as in #1 -Request will remain on hold for another 2 weeks, then she is to follow with urology as an outpatient and to be resumed after her hemoglobin remained stable, I have discussed with patient, and son, risks versus benefits, neurology recommendation to hold  anticoagulation for total of 3 weeks and then retrial of anticoagulation.  Chronic diastolic CHF, EF 81% on recent echocardiogram: Compensated now.  Hypertension - blood pressure is improved to reintroduced Coreg.  As needed hydralazine as well.  Type 2 diabetes mellitus with uncontrolled hyperglycemia, on long-term insulin: Continue SSI and monitor   CBG (last 3)  Recent Labs    01/29/22 2151 01/30/22 0734 01/30/22 1209  GLUCAP 275* 197* 159*        Condition - Extremely Guarded  Family Communication  :    Discussed with son by phone today  Code Status :  Full  Consults  :  Urology, PCCM , IR, Renal, Cardiology  PUD Prophylaxis : PPI   Procedures  :     CT - 1. Large left renal subcapsular hematoma extending into the perirenal space. 2. Similar positioning of the percutaneous drainage catheter and left pigtail ureteral stent. No hydronephrosis. 3. No bowel obstruction. 4. Partially visualized small left pleural effusion. 5.  Aortic Atherosclerosis.      Disposition Plan  :    Status is: Inpatient  DVT Prophylaxis  :    Place and maintain sequential compression device Start: 01/23/22 0601 SCDs Start: 01/22/22 0227    Lab Results  Component Value Date   PLT 431 (H) 01/30/2022    Diet :  Diet Order             Diet - low sodium heart healthy           Diet regular Room service appropriate? Yes; Fluid consistency: Thin  Diet effective now                    Inpatient Medications  Scheduled Meds:  amiodarone  100 mg Oral Daily   carvedilol  3.125 mg Oral BID WC   Chlorhexidine Gluconate Cloth  6 each Topical Q0600   cycloSPORINE  1 drop Both Eyes BID   darbepoetin (ARANESP) injection - NON-DIALYSIS  100 mcg Subcutaneous Q Thu-1800   feeding supplement (NEPRO CARB STEADY)  237 mL Oral BID BM   ferrous sulfate  325 mg Oral BID WC  folic acid  1 mg Oral Daily   hydrALAZINE  50 mg Oral Q8H   insulin aspart  0-6 Units Subcutaneous TID WC    lipase/protease/amylase  36,000 Units Oral TID WC   melatonin  5 mg Oral QHS   multivitamin  1 tablet Oral QHS   pantoprazole  40 mg Oral Daily   rOPINIRole  0.5 mg Oral BID   senna  1 tablet Oral QHS   sevelamer carbonate  1,600 mg Oral TID WC   Continuous Infusions:   PRN Meds:.acetaminophen, dextrose, hydrALAZINE, oxyCODONE, polyethylene glycol, prochlorperazine       Objective:   Vitals:   01/30/22 1327 01/30/22 1400 01/30/22 1430 01/30/22 1500  BP: 130/68 137/68 123/70 (!) 145/76  Pulse: 78 79 80 78  Resp: (!) '23 16 19 19  '$ Temp:      TempSrc:      SpO2: 99% 100% 100% 100%  Weight:      Height:        Wt Readings from Last 3 Encounters:  01/30/22 55.9 kg  11/23/21 58.8 kg  11/07/21 58.5 kg     Intake/Output Summary (Last 24 hours) at 01/30/2022 1526 Last data filed at 01/29/2022 2116 Gross per 24 hour  Intake --  Output 250 ml  Net -250 ml     Physical Exam   Awake Alert, Oriented X 3, No new F.N deficits, Normal affect Symmetrical Chest wall movement, Good air movement bilaterally, CTAB RRR,No Gallops,Rubs or new Murmurs, No Parasternal Heave +ve B.Sounds, Abd Soft, No tenderness, left external nephrostomy tube/drain in place No Cyanosis, Clubbing or edema, No new Rash or bruise       Data Review:    Recent Labs  Lab 01/24/22 0736 01/25/22 0327 01/26/22 0230 01/28/22 0318 01/30/22 0501  WBC 12.4* 10.7* 8.9 9.1 10.4  HGB 8.4* 8.0* 8.3* 9.1* 9.8*  HCT 25.8* 24.1* 25.0* 28.1* 29.7*  PLT 212 194 241 345 431*  MCV 89.3 88.9 89.3 90.1 88.4  MCH 29.1 29.5 29.6 29.2 29.2  MCHC 32.6 33.2 33.2 32.4 33.0  RDW 17.5* 17.5* 17.2* 17.6* 18.1*  LYMPHSABS 1.2 0.8 0.9 0.9 1.1  MONOABS 0.6 0.6 0.6 0.8 0.9  EOSABS 0.0 0.1 0.2 0.2 0.2  BASOSABS 0.1 0.0 0.0 0.0 0.0    Recent Labs  Lab 01/24/22 0736 01/25/22 0327 01/26/22 0230 01/28/22 0318 01/30/22 1342  NA 135 134* 134* 131* 131*  K 4.2 3.4* 3.6 3.8 4.0  CL 98 95* 95* 93* 93*  CO2 '23 28 26 26  26  '$ GLUCOSE 154* 100* 142* 141* 160*  BUN 50* 20 25* 21 19  CREATININE 6.06* 3.31* 4.42* 4.42* 4.55*  ALBUMIN  --   --   --  2.2* 2.3*  CALCIUM 8.6* 8.1* 8.4* 8.6* 8.5*      Radiology Reports No results found.    Signature  -   Phillips Climes M.D on 01/30/2022 at 3:26 PM   -  To page go to [email or web address]

## 2022-01-30 NOTE — Discharge Summary (Signed)
Physician Discharge Summary  Kim Burns OVF:[id number] DOB: August 02, 1935 DOA: 01/21/2022  PCP: [name], MD  Admit date: 01/21/2022 Discharge date: 01/30/2022  Admitted From: (Home) Disposition:  ( SNF )  Recommendations for Outpatient Follow-up:  Please monitor CBC, CMP in 3 days Patient will need to follow with IR regarding nephrostomy, and urology regarding stent and renal hematoma, please see follow-up instructions below Eliquis  remains on hold, can be resumed in 2 weeks once she is seen by urology and her hemoglobin has been stable.  Discharge Condition: (Stable) CODE STATUS: (FULL) Diet recommendation: renal / Carb Modified  Brief/Interim Summary:  86 year old female history of renal cell mass status post nephrostomy tube, ureteral stent, ESRD on HD MWF PAF on Eliquis, chronic diastolic CHF nonobstructive CAD, previous CVA presents with severe back pain found to have subcapsular hematoma.    See was having intermittent hematuria x3 wk,returned from a trip from [place] 12/4 and started having severe diffuse back pain during night so came to the ED.   In the ED had abnormal EKG with subtle inferior ST elevation and ST depression in lateral lead EDP discussed with on-call cardiology Dr. Irish Lack patient generally clean cath in July 2023 with 20% stenosis of RCA-advised holding off on cath and continue to repeat EKG follow-up troponins> +/- heparin if significant bump in troponin.  Labs showed significant leukocytosis, elevated creatinine 6.4, troponin 25 > 28 hemoglobin 9.0> 9.5>7.,5 blood glucose 425, lactic acidosis 5.7> 5.4> 4.3.CT renal study showed large left renal subcapsular hematoma extending into the perirenal space left pigtail ureteral stent in place, no hydronephrosis, urology  Dr Cain Sieve was consulted> advised that she does  Kcentra was given to reverse Eliquis.  Patient was started on IV antibiotics blood cultures and also start gentle IV fluids and  admitted.  Large left renal subcapsular hematoma. Intermittent hematuria x3 weeks. S/p left tumor ablation c/b urine leak-refractory to management and with an indwelling stent and nephrostomy tube: Hemoglobin drifting down, has been on movement and exam. She is s/p 2 units of packed RBC transfusion, also received Kcentra on 01/22/2022, bleeding seems to have held for now.  Seen by urology and IR.  CTA on 01/23/2022 shows no replete, per IR monitor.   Case discussed in detail with Dr. Louis Meckel patient's urologist on 01/24/2022, 01/25/2022, who wants to hold anticoagulation for at least 3 weeks thereafter cautious retrial of anticoagulation.  Patient understands the risks of holding Coreg along with benefits, agrees to hold it, currently no role for nephrectomy as the risk of procedure outweighs the benefits.  Please CBC trending up, left-sided flank pain better, no signs of ongoing active bleeding, stable for discharge, patient and family have now decided that they would like to go to SNF and not home with home health.  TOC looking for appropriate placement.   Acute blood loss anemia, anemia of chronic renal disease:  see above.   Suspected Severe Sepsis POA-given patient's lactic acidosis/leukocytosis, possible intra-abdominal source. History of left perinephric abscess: Clinically stable blood cultures negative, sepsis pathophysiology has resolved, tapered down antibiotics on 01/25/2022.   ESRD on HD MWF: Nephrology following .   Previous CVA, PAF on Eliquis: Eliquis discontinued,but will need to d/w risks/benefits;given her stroke history she is high risk w/ CHA2DS2-VASc Score = 7,  this was explained to patient and patient's son [name].  Upon family request cardiology team consulted who agreed with our management and signed off on 01/23/2022, home dose amiodarone continued, Coreg as in #1  Chronic diastolic CHF, EF 70% on recent echocardiogram: Compensated now.   Hypertension - blood pressure is  improved to reintroduced Coreg.  As needed hydralazine as well.   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  Left nephrostomy drain care as before unchanged.  Get CBC, CMP, 2 view Chest X ray -  checked next visit with your primary MD or SNF MD   Activity: As tolerated with Full fall precautions use walker/cane & assistance as needed  Disposition Home    Diet: Renal diet with 1.2 L fluid restriction per day.  Special Instructions: If you have smoked or chewed Tobacco  in the last 2 yrs please stop smoking, stop any regular Alcohol  and or any Recreational drug use.  On your next visit with your primary care physician please Get Medicines reviewed and adjusted.  Please request your Prim.MD to go over all Hospital Tests and Procedure/Radiological results at the follow up, please get all Hospital records sent to your Prim MD by signing hospital release before you go home.  If you experience worsening of your admission symptoms, develop shortness of breath, life threatening emergency, suicidal or homicidal thoughts you must seek medical attention immediately by calling 911 or calling your MD immediately  if symptoms less severe.  You Must read complete instructions/literature along with all the possible  adverse reactions/side effects for all the Medicines you take and that have been prescribed to you. Take any new Medicines after you have completely understood and accpet all the possible adverse reactions/side effects.   Increase activity slowly   Complete by: As directed       Allergies as of 01/30/2022       Reactions   Lopid [gemfibrozil] Other (See Comments)   Myalgia    Lotemax [loteprednol Etabonate] Rash   Skin burning  Blurry vision   Statins Other (See Comments)   Rosuvastatin Muscle weakness   Norco [hydrocodone-acetaminophen] Other (See Comments)   Headaches  Tolerates acetaminophen    Other Diarrhea   Real Butter - diarrhea   Tomato Other (See Comments)   Belching    Vibra-tab [doxycycline] Other (See Comments)   Unknown reaction   Amlodipine Other (See Comments)   Norvasc   Codeine Other (See Comments)   headache   Hydrocodone Other (See Comments)   Hydrocodone-acetaminophen Other (See Comments)   Headache. Tolerates acetaminophen.   Lisinopril Other (See Comments)   Verapamil Other (See Comments)        Medication List     STOP taking these medications    apixaban 2.5 MG Tabs tablet Commonly known as: Eliquis   NovoLOG FlexPen 100 UNIT/ML FlexPen Generic drug: insulin aspart Replaced by: insulin aspart 100 UNIT/ML injection   Tylenol 8 Hour Arthritis Pain 650 MG CR tablet Generic  drug: acetaminophen Replaced by: acetaminophen 325 MG tablet       TAKE these medications    acetaminophen 325 MG tablet Commonly known as: TYLENOL Take 2 tablets (650 mg total) by mouth every 6 (six) hours as needed for mild pain, fever or headache. Replaces: Tylenol 8 Hour Arthritis Pain 650 MG CR tablet   amiodarone 200 MG tablet Commonly known as: Pacerone Take 0.5 tablets (100 mg total) by mouth daily. Please keep appointment for further refills   carvedilol 3.125 MG tablet Commonly known as: COREG Take 1 tablet (3.125 mg total) by mouth 2 (two) times  daily with a meal. Take on the days you do not have dialysis What changed: additional instructions   colestipol 1 g tablet Commonly known as: COLESTID Take 1 g by mouth See admin instructions. 1 tablet (1g) prior to dialysis M-W-F + 1 tablet daily PRN diarrhea   Creon 36000 UNITS Cpep capsule Generic drug: lipase/protease/amylase Take 36,000 Units by mouth 2 (two) times daily with a meal.   diclofenac sodium 1 % Gel Commonly known as: VOLTAREN Apply 2 g topically 4 (four) times daily as needed (for hand pain).   feeding supplement (NEPRO CARB STEADY) Liqd Take 237 mLs by mouth 2 (two) times daily between meals.   ferrous sulfate 325 (65 FE) MG tablet Take 1 tablet (325 mg total) by mouth daily with breakfast.   folic acid 1 MG tablet Commonly known as: FOLVITE Take 1 tablet (1 mg total) by mouth daily. Start taking on: January 31, 2022   hydrALAZINE 50 MG tablet Commonly known as: APRESOLINE Take 1 tablet (50 mg total) by mouth 3 (three) times daily.   insulin aspart 100 UNIT/ML injection Commonly known as: novoLOG Inject 0-6 Units into the skin 3 (three) times daily with meals. Replaces: NovoLOG FlexPen 100 UNIT/ML FlexPen   lidocaine-prilocaine cream Commonly known as: EMLA Apply 1 Application topically every Monday, Wednesday, and Friday with hemodialysis.   pantoprazole 40 MG tablet Commonly known as: PROTONIX Take 1 tablet (40 mg total) by mouth daily. Start taking on: January 31, 2022   Pataday 0.1 % ophthalmic solution Generic drug: olopatadine Place 1 drop into both eyes 2 (two) times daily.   polyethylene glycol 17 g packet Commonly known as: MIRALAX / GLYCOLAX Take 17 g by mouth daily as needed for mild constipation.   Restasis 0.05 % ophthalmic emulsion Generic drug: cycloSPORINE 1 drop 2 (two) times daily.   rOPINIRole 0.25 MG tablet Commonly known as: REQUIP Take 0.25 mg by mouth at bedtime.   senna 8.6 MG Tabs tablet Commonly known as:  SENOKOT Take 1 tablet (8.6 mg total) by mouth at bedtime.   sevelamer carbonate 800 MG tablet Commonly known as: RENVELA Take 1,600 mg by mouth 3 (three) times daily with meals.   sodium chloride flush 0.9 % Soln Commonly known as: NS 5 mLs by Intracatheter route every 8 (eight) hours.   traMADol 50 MG tablet Commonly known as: ULTRAM Take 1 tablet (50 mg total) by mouth every 12 (twelve) hours as needed for moderate pain or severe pain. What changed: when to take this   VITAMIN D-3 PO Take 1 capsule by mouth every Monday, Wednesday, and Friday with hemodialysis.   Xiidra 5 % Soln Generic drug: Lifitegrast Place 1 drop into both eyes at bedtime.               Durable Medical Equipment  (From admission, onward)  Start     Ordered   01/25/22 1103  For home use only DME Walker rolling  Once       Comments: 5 wheel  Question Answer Comment  Walker: With [place] Wheels   Patient needs a walker to treat with the following condition Weakness      01/25/22 1102            Contact information for follow-up providers     [facility] [place] Follow up.   Specialty: Home Health Services Why: to resume [facility] services Contact information: [address] [place] [address] [phone]         [name], MD. Schedule an appointment as soon as possible for a visit in 1 week(s).   Specialty: Family Medicine Contact information: [address] [place] [address] [phone]         Ardis Hughs, MD. Schedule an appointment as soon as possible for a visit in 1 week(s).   Specialty: Urology Contact information: [place] [address] [place]. Schedule an appointment as soon as possible for a visit in 1 week(s).   Specialty: Radiology Contact information: [address] [id number] [place]  [place] [phone]             Contact information for after-discharge care     Destination     [facility] .   Service: Skilled Nursing Contact information: [address] [place] [address] [phone]                    Allergies  Allergen Reactions   Lopid [Gemfibrozil] Other (See Comments)    Myalgia    Lotemax [Loteprednol Etabonate] Rash    Skin burning  Blurry vision   Statins Other (See Comments)    Rosuvastatin Muscle weakness   Norco [Hydrocodone-Acetaminophen] Other (See Comments)    Headaches  Tolerates acetaminophen    Other Diarrhea    Real Butter - diarrhea   Tomato Other (See Comments)    Belching    Vibra-Tab [Doxycycline] Other (See Comments)    Unknown reaction   Amlodipine Other (See Comments)    Norvasc   Codeine Other (See Comments)    headache   Hydrocodone Other (See Comments)   Hydrocodone-Acetaminophen Other (See Comments)    Headache. Tolerates acetaminophen.   Lisinopril Other (See Comments)   Verapamil Other (See Comments)    Consultations: ***Specify Physician/Group   Procedures/Studies: CT Angio Abd/Pel w/ and/or w/o  Result Date: 01/23/2022 CLINICAL DATA:  Acute left-sided subcapsular renal and perinephric hemorrhage. EXAM: CT ANGIOGRAPHY ABDOMEN AND PELVIS WITH CONTRAST AND WITHOUT CONTRAST TECHNIQUE: Multidetector CT imaging of the abdomen and pelvis was performed using the standard protocol during bolus administration of intravenous contrast. Multiplanar reconstructed images and MIPs were obtained and reviewed to evaluate the vascular anatomy. RADIATION DOSE REDUCTION: This exam was performed according to the departmental dose-optimization program which includes automated exposure control, adjustment of the mA and/or kV according to patient size and/or use of iterative reconstruction technique. CONTRAST:  6m OMNIPAQUE IOHEXOL 350 MG/ML SOLN COMPARISON:  Non-contrast CT of the abdomen  and pelvis on 01/22/2022 FINDINGS: VASCULAR Aorta: Atherosclerosis of the abdominal aorta without evidence of aneurysm or dissection. Celiac: Normally patent. Normally patent branch vessels and branch vessel anatomy. SMA: Normally patent. Renals: Calcified plaque at origins of bilateral single renal arteries  without significant stenosis. IMA: Normally patent. Inflow: Normally patent bilateral iliac arteries without aneurysm. Proximal Outflow: Normally patent bilateral common femoral arteries and femoral bifurcations. Closure device ring on top of the right common femoral artery without mass effect or pseudoaneurysm. Veins: No venous abnormalities on venous phase imaging. Review of the MIP images confirms the above findings. NON-VASCULAR Lower chest: Small left pleural effusion and mild left basilar atelectasis. Hepatobiliary: No focal liver abnormality is seen. Status post cholecystectomy. No biliary dilatation. Pancreas: Status post Whipple procedure and pancreatectomy. Spleen: Normal in size without focal abnormality. Adrenals/Urinary Tract: Stable left posterior subcapsular and perinephric hematoma measuring approximately 9 cm in greatest diameter. This is stable when measuring at the same level on the prior unenhanced CT. No evidence of active bleeding on arterial or venous phases of imaging. No arterial pseudoaneurysm identified. Underlying left kidney demonstrates atrophy and some degree of underlying hydronephrosis. A ureteral stent is present extending from the collecting system into the bladder. Additional percutaneous drainage catheter remains present just inferior to the lower pole collecting system without fluid collection. Stomach/Bowel: No evidence of bowel obstruction, ileus or free intraperitoneal air. Lymphatic: No enlarged abdominal or pelvic lymph nodes. Reproductive: Status post hysterectomy. No adnexal masses. Other: No abdominal wall hernia or abnormality. No abdominopelvic ascites.  Musculoskeletal: Stable degenerative disc disease of the lumbar spine. Stable osteopenia. No bony lesions or fractures. IMPRESSION: 1. Stable left posterior subcapsular and perinephric hematoma measuring approximately 9 cm in greatest diameter. No evidence of active bleeding on arterial or venous phases of imaging. No arterial pseudoaneurysm identified. 2. Underlying left renal atrophy and some degree of underlying hydronephrosis. A ureteral stent is present extending from the collecting system into the bladder. Additional percutaneous drainage catheter remains present just inferior to the lower pole collecting system. 3. Small left pleural effusion and mild left basilar atelectasis. 4. Aortic atherosclerosis. Aortic Atherosclerosis (ICD10-I70.0). Electronically Signed   By: Aletta Edouard M.D.   On: 01/23/2022 12:29   CT Renal Stone Study  Result Date: 01/22/2022 CLINICAL DATA:  Abdominal flank pain. EXAM: CT ABDOMEN AND PELVIS WITHOUT CONTRAST TECHNIQUE: Multidetector CT imaging of the abdomen and pelvis was performed following the standard protocol without IV contrast. RADIATION DOSE REDUCTION: This exam was performed according to the departmental dose-optimization program which includes automated exposure control, adjustment of the mA and/or kV according to patient size and/or use of iterative reconstruction technique. COMPARISON:  CT abdomen pelvis dated 11/12/2021. FINDINGS: Evaluation of this exam is limited in the absence of intravenous contrast. Lower chest: Partially visualized small left pleural effusion. There is coronary vascular calcification. No intra-abdominal free air or free fluid. Hepatobiliary: The liver is unremarkable. There is mild biliary dilatation, post cholecystectomy. Pancreas: The pancreas is atrophic and poorly visualized. Spleen: The spleen is unremarkable. Adrenals/Urinary Tract: The right adrenal glands unremarkable. The left adrenal gland is not visualized. There has been  interval development of a large left renal subcapsular hematoma extending into the perirenal space. The hematoma measures approximately 6.2 x 8.9 cm in greatest axial dimensions and 14 cm in craniocaudal length. There is mass effect and compression of the left renal parenchyma. Please note evaluation for possible active bleed is limited on this noncontrast CT. Similar positioning of the percutaneous drainage catheter and left pigtail ureteral stent. No hydronephrosis. Moderate right renal atrophy with several cysts and additional smaller hypodense lesions which are not characterized. There is no hydronephrosis on the right. The right ureter and urinary bladder appear unremarkable. Stomach/Bowel: Postsurgical changes of  the distal stomach. The stomach is distended. High attenuating focus within the distal stomach likely ingested content. There is no bowel obstruction. The appendix is not visualized with certainty. No inflammatory changes identified in the right lower quadrant. Vascular/Lymphatic: Advanced aortoiliac atherosclerotic disease. The IVC is unremarkable. No portal venous gas. No obvious adenopathy. Reproductive: Hysterectomy. Other: None Musculoskeletal: Osteopenia with degenerative changes of the spine. No acute osseous pathology. IMPRESSION: 1. Large left renal subcapsular hematoma extending into the perirenal space. 2. Similar positioning of the percutaneous drainage catheter and left pigtail ureteral stent. No hydronephrosis. 3. No bowel obstruction. 4. Partially visualized small left pleural effusion. 5.  Aortic Atherosclerosis (ICD10-I70.0). These results were called by telephone at the time of interpretation on 01/22/2022 at 12:59 am to provider [facility] , who verbally acknowledged these results. Electronically Signed   By: Anner Crete M.D.   On: 01/22/2022 01:05   DG Chest Port 1 View  Result Date: 01/22/2022 CLINICAL DATA:  Possible sepsis. EXAM: PORTABLE CHEST 1 VIEW COMPARISON:   10/29/2021. FINDINGS: The heart is enlarged and the mediastinal contour is within normal limits. Atherosclerotic calcification of the aorta is noted. Lung volumes are low with atelectasis at the lung bases. No definite effusion or pneumothorax. Surgical clips are noted in the cervical soft tissues on the right. No acute osseous abnormality. IMPRESSION: 1. Low lung volumes with atelectasis at the lung bases. 2. Cardiomegaly. Electronically Signed   By: Brett Fairy M.D.   On: 01/22/2022 00:30     Subjective: She reports weakness, fatigue, asking for her iron pills to be lowered to once daily given constipation.  Discharge Exam: Vitals:   01/30/22 0845 01/30/22 1200  BP: 132/69 (!) 144/67  Pulse: 78 89  Resp: 18 19  Temp: 98 F (36.7 C) 97.8 F (36.6 C)  SpO2: 97% 99%   Vitals:   01/29/22 1940 01/29/22 2311 01/30/22 0845 01/30/22 1200  BP: 131/70 134/69 132/69 (!) 144/67  Pulse: 85 83 78 89  Resp: '18 17 18 19  '$ Temp: 98.1 F (36.7 C) 98 F (36.7 C) 98 F (36.7 C) 97.8 F (36.6 C)  TempSrc: Oral Oral Oral   SpO2: 97%  97% 99%  Weight:      Height:        General: Pt is alert, awake, not in acute distress, frail and deconditioned Cardiovascular: RRR, S1/S2 +, no rubs, no gallops Respiratory: CTA bilaterally, no wheezing, no rhonchi Abdominal: Soft, NT, ND, bowel sounds +, left external nephrostomy tube/drain in place Extremities: no edema, no cyanosis    The results of significant diagnostics from this hospitalization (including imaging, microbiology, ancillary and laboratory) are listed below for reference.     Microbiology: Recent Results (from the past 240 hour(s))  Blood culture (routine x 2)     Status: None   Collection Time: 01/21/22 12:01 AM   Specimen: BLOOD  Result Value Ref Range Status   Specimen Description BLOOD RIGHT ANTECUBITAL  Final   Special Requests   Final    BOTTLES DRAWN AEROBIC AND ANAEROBIC Blood Culture adequate volume   Culture   Final     NO GROWTH 5 DAYS Performed at [facility], [address], [place], [place] [address]    Report Status 01/27/2022 FINAL  Final  Blood culture (routine x 2)     Status: None   Collection Time: 01/22/22 12:38 AM   Specimen: BLOOD  Result Value Ref Range Status   Specimen Description BLOOD RIGHT ANTECUBITAL  Final   Special Requests   Final    BOTTLES DRAWN AEROBIC AND ANAEROBIC Blood Culture results may not be optimal due to an inadequate volume of blood received in culture bottles   Culture   Final    NO GROWTH 5 DAYS Performed at [facility], [place] [address] [place], [place] [address]    Report Status 01/27/2022 FINAL  Final  Resp Panel by RT-PCR (Flu A&B, Covid) Anterior Nasal Swab     Status: None   Collection Time: 01/22/22  1:10 AM   Specimen: Anterior Nasal Swab  Result Value Ref Range Status   SARS Coronavirus 2 by RT PCR NEGATIVE NEGATIVE Final    Comment: (NOTE) SARS-CoV-2 target nucleic acids are NOT DETECTED.  The SARS-CoV-2 RNA is generally detectable in upper respiratory specimens during the acute phase of infection. The lowest concentration of SARS-CoV-2 viral copies this assay can detect is 138 copies/mL. A negative result does not preclude SARS-Cov-2 infection and should not be used as the sole basis for treatment or other patient management decisions. A negative result may occur with  improper specimen collection/handling, submission of specimen other than nasopharyngeal swab, presence of viral mutation(s) within the areas targeted by this assay, and inadequate number of viral copies(<138 copies/mL). A negative result must be combined with clinical observations, patient history, and epidemiological information. The expected result is Negative.  Fact Sheet for Patients:  EntrepreneurPulse.com.au  Fact Sheet for Healthcare Providers:  IncredibleEmployment.be  This test is no t yet approved or cleared by the  [place] FDA and  has been authorized for detection and/or diagnosis of SARS-CoV-2 by FDA under an Emergency Use Authorization (EUA). This EUA will remain  in effect (meaning this test can be used) for the duration of the COVID-19 declaration under Section 564(b)(1) of the Act, 21 U.S.C.section 360bbb-3(b)(1), unless the authorization is terminated  or revoked sooner.       Influenza A by PCR NEGATIVE NEGATIVE Final   Influenza B by PCR NEGATIVE NEGATIVE Final    Comment: (NOTE) The Xpert Xpress SARS-CoV-2/FLU/RSV plus assay is intended as an aid in the diagnosis of influenza from Nasopharyngeal swab specimens and should not be used as a sole basis for treatment. Nasal washings and aspirates are unacceptable for Xpert Xpress SARS-CoV-2/FLU/RSV testing.  Fact Sheet for Patients: EntrepreneurPulse.com.au  Fact Sheet for Healthcare Providers: IncredibleEmployment.be  This test is not yet approved or cleared by the [place] FDA and has been authorized for detection and/or diagnosis of SARS-CoV-2 by FDA under an Emergency Use Authorization (EUA). This EUA will remain in effect (meaning this test can be used) for the duration of the COVID-19 declaration under Section 564(b)(1) of the Act, 21 U.S.C. section 360bbb-3(b)(1), unless the authorization is terminated or revoked.  Performed at [facility], [place] [address], [place], [place] [address]   MRSA Next Gen by PCR, Nasal     Status: None   Collection Time: 01/28/22  8:38 AM   Specimen: Nasal Mucosa; Nasal Swab  Result Value Ref Range Status   MRSA by PCR Next Gen NOT DETECTED NOT DETECTED Final    Comment: (NOTE) The GeneXpert MRSA Assay (FDA approved for NASAL specimens only), is one component of a comprehensive MRSA colonization surveillance program. It is not intended to diagnose MRSA infection nor to guide or monitor treatment for MRSA infections. Test performance is not  FDA approved in patients less than 76 years old. Performed at [facility], [address] [place], [place] [address]      Labs: BNP (last 3 results) Recent Labs    09/05/21 2118  BNP 5,277.8*   Basic Metabolic Panel: Recent Labs  Lab 01/24/22 0736 01/25/22 0327 01/26/22 0230 01/28/22 0318  NA 135 134* 134* 131*  K 4.2 3.4* 3.6 3.8  CL 98 95* 95* 93*  CO2 '23 28 26 26  '$ GLUCOSE 154* 100* 142* 141*  BUN 50* 20 25* 21  CREATININE 6.06* 3.31* 4.42* 4.42*  CALCIUM 8.6* 8.1* 8.4* 8.6*  PHOS  --   --   --  3.6   Liver Function Tests: Recent Labs  Lab 01/28/22 0318  ALBUMIN 2.2*   No results for input(s): "LIPASE", "AMYLASE" in the last 168 hours. No results for input(s): "AMMONIA" in the last 168 hours. CBC: Recent Labs  Lab 01/24/22 0736 01/25/22 0327 01/26/22 0230 01/28/22 0318 01/30/22 0501  WBC 12.4* 10.7* 8.9 9.1 10.4  NEUTROABS 10.5* 9.2* 7.0 7.2 8.1*  HGB 8.4* 8.0* 8.3* 9.1* 9.8*  HCT 25.8* 24.1* 25.0* 28.1* 29.7*  MCV 89.3 88.9 89.3 90.1 88.4  PLT 212 194 241 345 431*   Cardiac Enzymes: No results for input(s): "CKTOTAL", "CKMB", "CKMBINDEX", "TROPONINI" in the last 168 hours. BNP: Invalid input(s): "POCBNP" CBG: Recent Labs  Lab 01/29/22 1234 01/29/22 1612 01/29/22 2151 01/30/22 0734 01/30/22 1209  GLUCAP 191* 139* 275* 197* 159*   D-Dimer No results for input(s): "DDIMER" in the last 72 hours. Hgb A1c No results for input(s): "HGBA1C" in the last 72 hours. Lipid Profile No results for input(s): "CHOL", "HDL", "LDLCALC", "TRIG", "CHOLHDL", "LDLDIRECT" in the last 72 hours. Thyroid function studies No results for input(s): "TSH", "T4TOTAL", "T3FREE", "THYROIDAB" in the last 72 hours.  Invalid input(s): "FREET3" Anemia work up No results for input(s): "VITAMINB12", "FOLATE", "FERRITIN", "TIBC", "IRON", "RETICCTPCT" in the last 72 hours. Urinalysis    Component Value Date/Time   COLORURINE YELLOW 10/29/2021 1530   APPEARANCEUR  HAZY (A) 10/29/2021 1530   LABSPEC 1.015 10/29/2021 1530   PHURINE 7.0 10/29/2021 1530   GLUCOSEU NEGATIVE 10/29/2021 1530   HGBUR SMALL (A) 10/29/2021 1530   BILIRUBINUR NEGATIVE 10/29/2021 1530   KETONESUR NEGATIVE 10/29/2021 1530   PROTEINUR >=300 (A) 10/29/2021 1530   UROBILINOGEN 1.0 06/18/2013 1633   NITRITE NEGATIVE 10/29/2021 1530   LEUKOCYTESUR NEGATIVE 10/29/2021 1530   Sepsis Labs Recent Labs  Lab 01/25/22 0327 01/26/22 0230 01/28/22 0318 01/30/22 0501  WBC 10.7* 8.9 9.1 10.4   Microbiology Recent Results (from the past 240 hour(s))  Blood culture (routine x 2)     Status: None   Collection Time: 01/21/22 12:01 AM   Specimen: BLOOD  Result Value Ref Range Status   Specimen Description BLOOD RIGHT ANTECUBITAL  Final   Special Requests   Final    BOTTLES DRAWN AEROBIC AND ANAEROBIC Blood Culture adequate volume   Culture   Final    NO GROWTH 5 DAYS Performed at [facility], [place] [address], [place], [place] [address]    Report Status 01/27/2022 FINAL  Final  Blood culture (routine x 2)     Status: None   Collection Time: 01/22/22 12:38 AM   Specimen: BLOOD  Result Value Ref Range Status   Specimen Description BLOOD RIGHT ANTECUBITAL  Final   Special Requests   Final    BOTTLES DRAWN AEROBIC AND ANAEROBIC Blood Culture results may not be optimal due to an inadequate volume of blood received in culture bottles  Culture   Final    NO GROWTH 5 DAYS Performed at [facility], [place] [address], [place], [place] [address]    Report Status 01/27/2022 FINAL  Final  Resp Panel by RT-PCR (Flu A&B, Covid) Anterior Nasal Swab     Status: None   Collection Time: 01/22/22  1:10 AM   Specimen: Anterior Nasal Swab  Result Value Ref Range Status   SARS Coronavirus 2 by RT PCR NEGATIVE NEGATIVE Final    Comment: (NOTE) SARS-CoV-2 target nucleic acids are NOT DETECTED.  The SARS-CoV-2 RNA is generally detectable in upper respiratory specimens during the acute  phase of infection. The lowest concentration of SARS-CoV-2 viral copies this assay can detect is 138 copies/mL. A negative result does not preclude SARS-Cov-2 infection and should not be used as the sole basis for treatment or other patient management decisions. A negative result may occur with  improper specimen collection/handling, submission of specimen other than nasopharyngeal swab, presence of viral mutation(s) within the areas targeted by this assay, and inadequate number of viral copies(<138 copies/mL). A negative result must be combined with clinical observations, patient history, and epidemiological information. The expected result is Negative.  Fact Sheet for Patients:  EntrepreneurPulse.com.au  Fact Sheet for Healthcare Providers:  IncredibleEmployment.be  This test is no t yet approved or cleared by the [place] FDA and  has been authorized for detection and/or diagnosis of SARS-CoV-2 by FDA under an Emergency Use Authorization (EUA). This EUA will remain  in effect (meaning this test can be used) for the duration of the COVID-19 declaration under Section 564(b)(1) of the Act, 21 U.S.C.section 360bbb-3(b)(1), unless the authorization is terminated  or revoked sooner.       Influenza A by PCR NEGATIVE NEGATIVE Final   Influenza B by PCR NEGATIVE NEGATIVE Final    Comment: (NOTE) The Xpert Xpress SARS-CoV-2/FLU/RSV plus assay is intended as an aid in the diagnosis of influenza from Nasopharyngeal swab specimens and should not be used as a sole basis for treatment. Nasal washings and aspirates are unacceptable for Xpert Xpress SARS-CoV-2/FLU/RSV testing.  Fact Sheet for Patients: EntrepreneurPulse.com.au  Fact Sheet for Healthcare Providers: IncredibleEmployment.be  This test is not yet approved or cleared by the [place] FDA and has been authorized for detection and/or diagnosis of  SARS-CoV-2 by FDA under an Emergency Use Authorization (EUA). This EUA will remain in effect (meaning this test can be used) for the duration of the COVID-19 declaration under Section 564(b)(1) of the Act, 21 U.S.C. section 360bbb-3(b)(1), unless the authorization is terminated or revoked.  Performed at [facility], [place] [address], [place], [place] [address]   MRSA Next Gen by PCR, Nasal     Status: None   Collection Time: 01/28/22  8:38 AM   Specimen: Nasal Mucosa; Nasal Swab  Result Value Ref Range Status   MRSA by PCR Next Gen NOT DETECTED NOT DETECTED Final    Comment: (NOTE) The GeneXpert MRSA Assay (FDA approved for NASAL specimens only), is one component of a comprehensive MRSA colonization surveillance program. It is not intended to diagnose MRSA infection nor to guide or monitor treatment for MRSA infections. Test performance is not FDA approved in patients less than 2 years old. Performed at [facility], [place] [address] [place],  [address]      Time coordinating discharge: Over 30 minutes  SIGNED:   Phillips Climes, MD  [facility] 01/30/2022, 12:25 PM Pager   If 7PM-7AM, please  contact night-coverage [email or web address]

## 2022-01-30 NOTE — Progress Notes (Signed)
D/C order noted. Contacted [facility] to advise clinic of pt's d/c today and that pt should resume care on Friday. Clinic advised pt will be going to [facility].   Melven Sartorius Renal Navigator [phone]

## 2022-01-30 NOTE — Plan of Care (Signed)

## 2022-01-31 ENCOUNTER — Inpatient Hospital Stay (HOSPITAL_COMMUNITY): Payer: Medicare PPO

## 2022-01-31 DIAGNOSIS — N186 End stage renal disease: Secondary | ICD-10-CM | POA: Diagnosis not present

## 2022-01-31 DIAGNOSIS — H04129 Dry eye syndrome of unspecified lacrimal gland: Secondary | ICD-10-CM | POA: Diagnosis not present

## 2022-01-31 DIAGNOSIS — I120 Hypertensive chronic kidney disease with stage 5 chronic kidney disease or end stage renal disease: Secondary | ICD-10-CM | POA: Diagnosis not present

## 2022-01-31 DIAGNOSIS — Z8673 Personal history of transient ischemic attack (TIA), and cerebral infarction without residual deficits: Secondary | ICD-10-CM | POA: Diagnosis not present

## 2022-01-31 DIAGNOSIS — E871 Hypo-osmolality and hyponatremia: Secondary | ICD-10-CM | POA: Diagnosis not present

## 2022-01-31 DIAGNOSIS — I5032 Chronic diastolic (congestive) heart failure: Secondary | ICD-10-CM | POA: Diagnosis not present

## 2022-01-31 DIAGNOSIS — C649 Malignant neoplasm of unspecified kidney, except renal pelvis: Secondary | ICD-10-CM | POA: Diagnosis not present

## 2022-01-31 DIAGNOSIS — N151 Renal and perinephric abscess: Secondary | ICD-10-CM | POA: Diagnosis not present

## 2022-01-31 DIAGNOSIS — E119 Type 2 diabetes mellitus without complications: Secondary | ICD-10-CM | POA: Diagnosis not present

## 2022-01-31 DIAGNOSIS — I10 Essential (primary) hypertension: Secondary | ICD-10-CM | POA: Diagnosis not present

## 2022-01-31 DIAGNOSIS — S37012D Minor contusion of left kidney, subsequent encounter: Secondary | ICD-10-CM | POA: Diagnosis not present

## 2022-01-31 DIAGNOSIS — E1159 Type 2 diabetes mellitus with other circulatory complications: Secondary | ICD-10-CM | POA: Diagnosis not present

## 2022-01-31 DIAGNOSIS — D62 Acute posthemorrhagic anemia: Secondary | ICD-10-CM | POA: Diagnosis not present

## 2022-01-31 DIAGNOSIS — I132 Hypertensive heart and chronic kidney disease with heart failure and with stage 5 chronic kidney disease, or end stage renal disease: Secondary | ICD-10-CM | POA: Diagnosis not present

## 2022-01-31 DIAGNOSIS — A419 Sepsis, unspecified organism: Secondary | ICD-10-CM | POA: Diagnosis not present

## 2022-01-31 DIAGNOSIS — S37012A Minor contusion of left kidney, initial encounter: Secondary | ICD-10-CM | POA: Diagnosis not present

## 2022-01-31 DIAGNOSIS — E1122 Type 2 diabetes mellitus with diabetic chronic kidney disease: Secondary | ICD-10-CM | POA: Diagnosis not present

## 2022-01-31 DIAGNOSIS — K219 Gastro-esophageal reflux disease without esophagitis: Secondary | ICD-10-CM | POA: Diagnosis not present

## 2022-01-31 DIAGNOSIS — N2581 Secondary hyperparathyroidism of renal origin: Secondary | ICD-10-CM | POA: Diagnosis not present

## 2022-01-31 DIAGNOSIS — Z936 Other artificial openings of urinary tract status: Secondary | ICD-10-CM | POA: Diagnosis not present

## 2022-01-31 DIAGNOSIS — N250 Renal osteodystrophy: Secondary | ICD-10-CM | POA: Diagnosis not present

## 2022-01-31 DIAGNOSIS — I480 Paroxysmal atrial fibrillation: Secondary | ICD-10-CM | POA: Diagnosis not present

## 2022-01-31 DIAGNOSIS — R0989 Other specified symptoms and signs involving the circulatory and respiratory systems: Secondary | ICD-10-CM | POA: Diagnosis not present

## 2022-01-31 DIAGNOSIS — I69398 Other sequelae of cerebral infarction: Secondary | ICD-10-CM | POA: Diagnosis not present

## 2022-01-31 DIAGNOSIS — R4182 Altered mental status, unspecified: Secondary | ICD-10-CM | POA: Diagnosis not present

## 2022-01-31 DIAGNOSIS — Z992 Dependence on renal dialysis: Secondary | ICD-10-CM | POA: Diagnosis not present

## 2022-01-31 LAB — GLUCOSE, CAPILLARY
Glucose-Capillary: 149 mg/dL — ABNORMAL HIGH (ref 70–99)
Glucose-Capillary: 160 mg/dL — ABNORMAL HIGH (ref 70–99)

## 2022-01-31 NOTE — Progress Notes (Signed)
[facility] Progress Note   Subjective:   Pt seen in room, reports she is feeling better every day. Had a CT head today, she is not sure why. Denies CP, palpitations, dizziness and nausea. Says she is discharging today.   Objective Vitals:   01/30/22 1932 01/30/22 2108 01/31/22 0546 01/31/22 0825  BP: 135/67 138/65 (!) 147/73 (!) 149/79  Pulse: 95   94  Resp:    16  Temp: 97.8 F (36.6 C)   98.4 F (36.9 C)  TempSrc: Oral   Oral  SpO2:      Weight:      Height:       Physical Exam General: Alert female in NAD Heart: RRR, no murmurs, rubs or gallops Lungs: CTA bilaterally without wheezing, rhonchi or rales Abdomen: Soft, non-distended, +BS Extremities: No edema b/l lower extremities Dialysis Access:  LUE AVF +bruit  Additional Objective Labs: Basic Metabolic Panel: Recent Labs  Lab 01/26/22 0230 01/28/22 0318 01/30/22 1342  NA 134* 131* 131*  K 3.6 3.8 4.0  CL 95* 93* 93*  CO2 '26 26 26  '$ GLUCOSE 142* 141* 160*  BUN 25* 21 19  CREATININE 4.42* 4.42* 4.55*  CALCIUM 8.4* 8.6* 8.5*  PHOS  --  3.6 3.3   Liver Function Tests: Recent Labs  Lab 01/28/22 0318 01/30/22 1342  ALBUMIN 2.2* 2.3*   No results for input(s): "LIPASE", "AMYLASE" in the last 168 hours. CBC: Recent Labs  Lab 01/25/22 0327 01/26/22 0230 01/28/22 0318 01/30/22 0501  WBC 10.7* 8.9 9.1 10.4  NEUTROABS 9.2* 7.0 7.2 8.1*  HGB 8.0* 8.3* 9.1* 9.8*  HCT 24.1* 25.0* 28.1* 29.7*  MCV 88.9 89.3 90.1 88.4  PLT 194 241 345 431*   Blood Culture    Component Value Date/Time   SDES BLOOD RIGHT ANTECUBITAL 01/22/2022 0038   SPECREQUEST  01/22/2022 0038    BOTTLES DRAWN AEROBIC AND ANAEROBIC Blood Culture results may not be optimal due to an inadequate volume of blood received in culture bottles   CULT  01/22/2022 0038    NO GROWTH 5 DAYS Performed at [facility], [place] [address], [place],  [address]    REPTSTATUS 01/27/2022 FINAL 01/22/2022 0038    Cardiac  Enzymes: No results for input(s): "CKTOTAL", "CKMB", "CKMBINDEX", "TROPONINI" in the last 168 hours. CBG: Recent Labs  Lab 01/30/22 0734 01/30/22 1209 01/30/22 1844 01/30/22 2113 01/31/22 0824  GLUCAP 197* 159* 159* 224* 149*   Iron Studies: No results for input(s): "IRON", "TIBC", "TRANSFERRIN", "FERRITIN" in the last 72 hours. '@lablastinr3'$ @ Studies/Results: CT HEAD WO CONTRAST (5MM)  Result Date: 01/31/2022 CLINICAL DATA:  Altered mental status EXAM: CT HEAD WITHOUT CONTRAST TECHNIQUE: Contiguous axial images were obtained from the base of the skull through the vertex without intravenous contrast. RADIATION DOSE REDUCTION: This exam was performed according to the departmental dose-optimization program which includes automated exposure control, adjustment of the mA and/or kV according to patient size and/or use of iterative reconstruction technique. COMPARISON:  08/28/21 CT Brain FINDINGS: Brain: No evidence of acute infarction, hemorrhage, hydrocephalus, extra-axial collection or mass lesion/mass effect. 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 ( Vascular: No hyperdense vessel or unexpected calcification. Skull: Normal. Negative for fracture or focal lesion. Sinuses/Orbits: Trace mucosal thickening left maxillary sinus. 1 bilateral lens replacement. Other: None. IMPRESSION: No hemorrhage or CT evidence of an acute infarct. No specific etiology for altered mental status identified. Electronically Signed   By: Marin Roberts M.D.   On: 01/31/2022 09:42  Medications:   amiodarone  100 mg Oral Daily   carvedilol  3.125 mg Oral BID WC   Chlorhexidine Gluconate Cloth  6 each Topical Q0600   cycloSPORINE  1 drop Both Eyes BID   darbepoetin (ARANESP) injection - NON-DIALYSIS  100 mcg Subcutaneous Q Thu-1800   feeding supplement (NEPRO CARB STEADY)  237 mL Oral BID BM   ferrous sulfate  325 mg Oral BID WC   folic acid  1 mg Oral Daily   hydrALAZINE  50 mg Oral Q8H   insulin aspart   0-6 Units Subcutaneous TID WC   lipase/protease/amylase  36,000 Units Oral TID WC   melatonin  5 mg Oral QHS   multivitamin  1 tablet Oral QHS   pantoprazole  40 mg Oral Daily   rOPINIRole  0.5 mg Oral BID   senna  1 tablet Oral QHS   sevelamer carbonate  1,600 mg Oral TID WC    Outpatient Dialysis Orders: MWF at [facility] 3:45hr, 350/A1.5, EDW 58kg, 2K/2Ca, AVF, 16g needles, no heparin - Calcitriol 1.48mg PO q HD - No recent ESA - was on hold for Hgb 12.9 on 01/16/22 - Binders: Renvela 2/meals.  Assessment/Plan: L renal hematoma: In setting of prior urinoma s/p RCC ablation. Hematoma is not communicating with current JP drain as her bag is not bloody.  Urology and IR involved, no further treatment at this time. Considering embolizing the L kidney in the future. Sepsis: WBC and LA very high on admit, started on IV Cefepime - both improved, will finish course PO Keflex.  ESRD: Usual MWF schedule, next HD tomorrow  Hypertension/volume: BP stable, now below prior dry weight. No volume overload on exam   Anemia of ESRD + ABLA: Hgb dropped from 12.9 last week ->7.5 on admit. 2U PRBCs given 12/5. Eliquis on hold. Hgb now stablizing, up to 9.8. Aranesp 1028m given 12/7.  Metabolic bone disease: CorrCa high, Phos high. Hold VDRA for now, resumed home binders.  Nutrition:  Alb low, she dislikes pro-source supplement. Appetite low, adding rena-vite.  A-fib: Home Eliquis on hold -> plan to stay off for at least 3 weeks then re-eval per notes.    SaAnice PaganiniPA-C 01/31/2022, 9:56 AM  [facility] Pager: [phone]

## 2022-01-31 NOTE — Progress Notes (Signed)
Clinic advised pt will d/c today to snf and resume care tomorrow.   Melven Sartorius Renal Navigator [phone]

## 2022-01-31 NOTE — TOC Transition Note (Signed)
Transition of Care [facility]) - CM/SW Discharge Note   Patient Details  Name: Kim Burns MRN: [id number] Date of Birth: 12-Dec-1935  [facility] CM/SW Contact:  Benard Halsted, LCSW Phone Number: 01/31/2022, 2:02 PM   Clinical Narrative:    Patient will DC to: [facility] Anticipated DC date: 01/31/22 Family notified: Roel Cluck Transport by: Son by car after 4pm   Per MD patient ready for DC to [facility]. RN to call report prior to discharge [phone]). RN, patient, patient's family, and facility notified of DC. Discharge Summary and FL2 sent to facility. Signed script in packet on chart to be sent with patient.  CSW will sign off for now as social work intervention is no longer needed. Please consult [place] again if new needs arise.     Final next level of care: Skilled Nursing Facility Barriers to Discharge: Barriers Resolved   Patient Goals and CMS Choice Patient states their goals for this hospitalization and ongoing recovery are:: Rehab CMS Medicare.gov Compare Post Acute Care list provided to:: Patient Represenative (must comment) Choice offered to / list presented to : Adult Children  Discharge Placement   Existing PASRR number confirmed : 01/31/22          Patient chooses bed at: [facility] Patient to be transferred to facility by: Son by car Name of family member notified: Roel Cluck Patient and family notified of of transfer: 01/31/22  Discharge Plan and Services In-house Referral: Clinical Social Work Discharge Planning Services: CM Consult Post Acute Care Choice: [place]: [place] Date [place]: 01/23/22 Time [place]: [phone] Representative spoke with at [place]: [place] ([place]) Interventions     Readmission Risk Interventions    08/27/2021   10:16 AM  Readmission Risk Prevention Plan  Transportation Screening Complete   PCP or Specialist Appt within 3-5 Days Complete  HRI or [place] Complete  Social Work Consult for [place] Planning/Counseling Complete  Palliative Care Screening Not Applicable  Medication Review Press photographer) Complete

## 2022-01-31 NOTE — Plan of Care (Signed)
  Problem: Education: Goal: Ability to describe self-care measures that may prevent or decrease complications (Diabetes Survival Skills Education) will improve Outcome: Progressing Goal: Individualized Educational Video(s) Outcome: Progressing   Problem: Coping: Goal: Ability to adjust to condition or change in health will improve Outcome: Progressing   Problem: Fluid Volume: Goal: Ability to maintain a balanced intake and output will improve Outcome: Progressing   Problem: Health Behavior/Discharge Planning: Goal: Ability to identify and utilize available resources and services will improve Outcome: Progressing Goal: Ability to manage health-related needs will improve Outcome: Progressing   Problem: Metabolic: Goal: Ability to maintain appropriate glucose levels will improve Outcome: Progressing   Problem: Nutritional: Goal: Maintenance of adequate nutrition will improve Outcome: Progressing Goal: Progress toward achieving an optimal weight will improve Outcome: Progressing   Problem: Skin Integrity: Goal: Risk for impaired skin integrity will decrease Outcome: Progressing   Problem: Tissue Perfusion: Goal: Adequacy of tissue perfusion will improve Outcome: Progressing   Problem: Education: Goal: Knowledge of General Education information will improve Description: Including pain rating scale, medication(s)/side effects and non-pharmacologic comfort measures Outcome: Progressing   Problem: Health Behavior/Discharge Planning: Goal: Ability to manage health-related needs will improve Outcome: Progressing   Problem: Clinical Measurements: Goal: Ability to maintain clinical measurements within normal limits will improve Outcome: Progressing Goal: Will remain free from infection Outcome: Progressing Goal: Diagnostic test results will improve Outcome: Progressing Goal: Respiratory complications will improve Outcome: Progressing Goal: Cardiovascular complication will  be avoided Outcome: Progressing   Problem: Activity: Goal: Risk for activity intolerance will decrease Outcome: Progressing   Problem: Nutrition: Goal: Adequate nutrition will be maintained Outcome: Progressing   Problem: Coping: Goal: Level of anxiety will decrease Outcome: Progressing   Problem: Elimination: Goal: Will not experience complications related to bowel motility Outcome: Progressing Goal: Will not experience complications related to urinary retention Outcome: Progressing   Problem: Pain Managment: Goal: General experience of comfort will improve Outcome: Progressing

## 2022-01-31 NOTE — Progress Notes (Signed)
Mobility Specialist Progress Note   01/31/22 1619  Mobility  Activity Ambulated with assistance in hallway  Level of Assistance Standby assist, set-up cues, supervision of patient - no hands on  Assistive Device Front wheel walker  Distance Ambulated (ft) 150 ft  Range of Motion/Exercises Active;All extremities  Activity Response Tolerated well   Patient received in supine and agreeable to participate. Ambulated supervision level with slow steady gait. Returned to room without complaint or incident. Was left in supine with all needs met, call bell in reach.   [place] Amandeep Nesmith, BS EXP Mobility Specialist Please contact via SecureChat or Rehab office at [phone]

## 2022-02-01 DIAGNOSIS — N186 End stage renal disease: Secondary | ICD-10-CM | POA: Diagnosis not present

## 2022-02-01 DIAGNOSIS — Z992 Dependence on renal dialysis: Secondary | ICD-10-CM | POA: Diagnosis not present

## 2022-02-01 DIAGNOSIS — N2581 Secondary hyperparathyroidism of renal origin: Secondary | ICD-10-CM | POA: Diagnosis not present

## 2022-02-04 DIAGNOSIS — Z8673 Personal history of transient ischemic attack (TIA), and cerebral infarction without residual deficits: Secondary | ICD-10-CM | POA: Diagnosis not present

## 2022-02-04 DIAGNOSIS — C649 Malignant neoplasm of unspecified kidney, except renal pelvis: Secondary | ICD-10-CM | POA: Diagnosis not present

## 2022-02-04 DIAGNOSIS — E1159 Type 2 diabetes mellitus with other circulatory complications: Secondary | ICD-10-CM | POA: Diagnosis not present

## 2022-02-04 DIAGNOSIS — H04129 Dry eye syndrome of unspecified lacrimal gland: Secondary | ICD-10-CM | POA: Diagnosis not present

## 2022-02-04 DIAGNOSIS — E1122 Type 2 diabetes mellitus with diabetic chronic kidney disease: Secondary | ICD-10-CM | POA: Diagnosis not present

## 2022-02-04 DIAGNOSIS — I480 Paroxysmal atrial fibrillation: Secondary | ICD-10-CM | POA: Diagnosis not present

## 2022-02-04 DIAGNOSIS — Z992 Dependence on renal dialysis: Secondary | ICD-10-CM | POA: Diagnosis not present

## 2022-02-04 DIAGNOSIS — Z936 Other artificial openings of urinary tract status: Secondary | ICD-10-CM | POA: Diagnosis not present

## 2022-02-04 DIAGNOSIS — S37012A Minor contusion of left kidney, initial encounter: Secondary | ICD-10-CM | POA: Diagnosis not present

## 2022-02-04 DIAGNOSIS — E871 Hypo-osmolality and hyponatremia: Secondary | ICD-10-CM | POA: Diagnosis not present

## 2022-02-04 DIAGNOSIS — I132 Hypertensive heart and chronic kidney disease with heart failure and with stage 5 chronic kidney disease, or end stage renal disease: Secondary | ICD-10-CM | POA: Diagnosis not present

## 2022-02-04 DIAGNOSIS — N2581 Secondary hyperparathyroidism of renal origin: Secondary | ICD-10-CM | POA: Diagnosis not present

## 2022-02-04 DIAGNOSIS — N186 End stage renal disease: Secondary | ICD-10-CM | POA: Diagnosis not present

## 2022-02-04 DIAGNOSIS — I5032 Chronic diastolic (congestive) heart failure: Secondary | ICD-10-CM | POA: Diagnosis not present

## 2022-02-04 DIAGNOSIS — D62 Acute posthemorrhagic anemia: Secondary | ICD-10-CM | POA: Diagnosis not present

## 2022-02-06 DIAGNOSIS — Z936 Other artificial openings of urinary tract status: Secondary | ICD-10-CM | POA: Diagnosis not present

## 2022-02-06 DIAGNOSIS — N186 End stage renal disease: Secondary | ICD-10-CM | POA: Diagnosis not present

## 2022-02-06 DIAGNOSIS — S37012A Minor contusion of left kidney, initial encounter: Secondary | ICD-10-CM | POA: Diagnosis not present

## 2022-02-06 DIAGNOSIS — I132 Hypertensive heart and chronic kidney disease with heart failure and with stage 5 chronic kidney disease, or end stage renal disease: Secondary | ICD-10-CM | POA: Diagnosis not present

## 2022-02-06 DIAGNOSIS — Z992 Dependence on renal dialysis: Secondary | ICD-10-CM | POA: Diagnosis not present

## 2022-02-06 DIAGNOSIS — I5032 Chronic diastolic (congestive) heart failure: Secondary | ICD-10-CM | POA: Diagnosis not present

## 2022-02-06 DIAGNOSIS — D62 Acute posthemorrhagic anemia: Secondary | ICD-10-CM | POA: Diagnosis not present

## 2022-02-06 DIAGNOSIS — E1122 Type 2 diabetes mellitus with diabetic chronic kidney disease: Secondary | ICD-10-CM | POA: Diagnosis not present

## 2022-02-06 DIAGNOSIS — I69398 Other sequelae of cerebral infarction: Secondary | ICD-10-CM | POA: Diagnosis not present

## 2022-02-06 DIAGNOSIS — N2581 Secondary hyperparathyroidism of renal origin: Secondary | ICD-10-CM | POA: Diagnosis not present

## 2022-02-08 DIAGNOSIS — N186 End stage renal disease: Secondary | ICD-10-CM | POA: Diagnosis not present

## 2022-02-08 DIAGNOSIS — N2581 Secondary hyperparathyroidism of renal origin: Secondary | ICD-10-CM | POA: Diagnosis not present

## 2022-02-08 DIAGNOSIS — Z992 Dependence on renal dialysis: Secondary | ICD-10-CM | POA: Diagnosis not present

## 2022-02-10 DIAGNOSIS — N186 End stage renal disease: Secondary | ICD-10-CM | POA: Diagnosis not present

## 2022-02-10 DIAGNOSIS — Z992 Dependence on renal dialysis: Secondary | ICD-10-CM | POA: Diagnosis not present

## 2022-02-10 DIAGNOSIS — N2581 Secondary hyperparathyroidism of renal origin: Secondary | ICD-10-CM | POA: Diagnosis not present

## 2022-02-12 DIAGNOSIS — N186 End stage renal disease: Secondary | ICD-10-CM | POA: Diagnosis not present

## 2022-02-12 DIAGNOSIS — D649 Anemia, unspecified: Secondary | ICD-10-CM | POA: Diagnosis not present

## 2022-02-12 DIAGNOSIS — S37019A Minor contusion of unspecified kidney, initial encounter: Secondary | ICD-10-CM | POA: Diagnosis not present

## 2022-02-12 DIAGNOSIS — I4891 Unspecified atrial fibrillation: Secondary | ICD-10-CM | POA: Diagnosis not present

## 2022-02-12 DIAGNOSIS — I10 Essential (primary) hypertension: Secondary | ICD-10-CM | POA: Diagnosis not present

## 2022-02-12 DIAGNOSIS — Z79899 Other long term (current) drug therapy: Secondary | ICD-10-CM | POA: Diagnosis not present

## 2022-02-13 DIAGNOSIS — N2581 Secondary hyperparathyroidism of renal origin: Secondary | ICD-10-CM | POA: Diagnosis not present

## 2022-02-13 DIAGNOSIS — Z992 Dependence on renal dialysis: Secondary | ICD-10-CM | POA: Diagnosis not present

## 2022-02-13 DIAGNOSIS — M180 Bilateral primary osteoarthritis of first carpometacarpal joints: Secondary | ICD-10-CM | POA: Diagnosis not present

## 2022-02-13 DIAGNOSIS — N186 End stage renal disease: Secondary | ICD-10-CM | POA: Diagnosis not present

## 2022-02-15 DIAGNOSIS — N186 End stage renal disease: Secondary | ICD-10-CM | POA: Diagnosis not present

## 2022-02-15 DIAGNOSIS — N2581 Secondary hyperparathyroidism of renal origin: Secondary | ICD-10-CM | POA: Diagnosis not present

## 2022-02-15 DIAGNOSIS — Z992 Dependence on renal dialysis: Secondary | ICD-10-CM | POA: Diagnosis not present

## 2022-02-17 DIAGNOSIS — N2581 Secondary hyperparathyroidism of renal origin: Secondary | ICD-10-CM | POA: Diagnosis not present

## 2022-02-17 DIAGNOSIS — I158 Other secondary hypertension: Secondary | ICD-10-CM | POA: Diagnosis not present

## 2022-02-17 DIAGNOSIS — N186 End stage renal disease: Secondary | ICD-10-CM | POA: Diagnosis not present

## 2022-02-17 DIAGNOSIS — Z992 Dependence on renal dialysis: Secondary | ICD-10-CM | POA: Diagnosis not present

## 2022-02-19 NOTE — Progress Notes (Signed)
Cardiology Office Note   Date:  02/25/2022   ID:  Kim Burns 1935/09/11, MRN [id number]  PCP:  [name], MD  Cardiologist:   Etheline Geppert [place], MD   No chief complaint on file.     History of Present Illness: Kim Burns is a 87 y.o. female who is seen for follow up Afib and orthostatic hypotension. She has a history of CKD and HTN. Echo in 2015 showed LVH with normal systolic function. She had a Whipple procedure in 2014 with pancreatectomy and partial bowel resection. She developed ESRD felt to be related to DM and HTN. She has been on hemodialysis.  With dialysis she has developed orthostatic hypotension that is worse on dialysis days. Her antihypertensives have been discontinued and she is now on midodrine on dialysis days. No syncope. She also has bilateral carpel tunnel syndrome and surgery has been recommended. Seeing Dr Fredna Dow. She has a slow growing renal cancer on her left kidney and underwent ablation of this in July.  She denies any chest pain or SOB. Does tend to bleed a lot from her vascular access site with dialysis.   In July she developed  new onset atrial fibrillation with RVR during hospitalization in early July 2023 after an ablation procedure for left renal cell carcinoma. She was started on rate control agents as well as Eliquis for stroke prophylaxis.  She had spontaneously converted back to sinus rhythm prior to discharge after receiving IV Cardizem. CTA of abdomen and chest did not show any evidence of PE, 3 mm left pulmonary nodule noted, stable solid mass in the medial aspect of the left kidney.  Diagnostic cardiac catheterization on 7/10 due to elevated troponins on 7/8 revealed nonobstructive disease with 20% proximal RCA, 20% left circumflex artery, 20% mid left main, 30% ostial LAD, 50% proximal LAD, EF 55 to 65%.  Repeat echo obtained on the same day revealed LVEF of 60 to 65%, moderately reduced RV systolic function, grade 3 diastolic  dysfunction, RSVP 46.1 mmHg, severe biatrial enlargement, mild MR, mild to moderate TR.    She was admitted 9/11-9/17/23 with acute sepsis. CT abdomen and pelvis in ED showed interval development of post ablation changes to left kidney with additional interval development of severe hydronephrosis with obstruction of the proximal ureter.  She also had lactic acidosis and leukocytosis.  Cultures obtained.  She was started on vancomycin and Zosyn. Blood culture with Streptococcus gallolyticus in 1 out of 2 bottles.  Patient had left ureteral stent placement by urology on 9/13, and drain placed for left perinephric abscess by IR on 9/15.   She was admitted on Dec 13 with severe flank pain. Hgb down to 7.5. She has a history of renal cell mass status post nephrostomy tube, ureteral stent. CT renal study showed large left renal subcapsular hematoma extending into the perirenal space left pigtail ureteral stent in place, no hydronephrosis. Eliquis was held. Urology consulted and observation recommended. Eliquis was held.   On follow up today she is seen with her son. She denies any further bleeding or flank pain. No Afib. HR stable. In the hospital she was started on hydralazine on her nondialysis days for BP control. Her BP has done well. Still notes sometimes BP drops during dialysis.   Past Medical History:  Diagnosis Date   Anemia of chronic disease    takes iron   Anxiety    Blood transfusion without reported diagnosis    Bradycardia  Diabetes mellitus without complication ([place])    became diabetic after Whipple procedure   Diarrhea    Dysrhythmia 04/16/2016   bradycardia due to medication    ESRD on hemodialysis [facility])    M-W-F   GERD (gastroesophageal reflux disease)    Gout    Headache    History of kidney stones 06/2013   Hyperparathyroidism ([place])    Hypertension    PONV (postoperative nausea and vomiting)    Sleep apnea    no cpap machine. could not tolerate   Stroke ([place])  04/16/2016   TIA 1995   Vitamin D deficiency     Past Surgical History:  Procedure Laterality Date   A/V FISTULAGRAM Left 08/30/2021   Procedure: A/V Fistulagram;  Surgeon: Marty Heck, MD;  Location: [place] [facility];  Service: Cardiovascular;  Laterality: Left;   ABDOMINAL HYSTERECTOMY  1985   complete   BACK SURGERY  7209   lower   BASCILIC VEIN TRANSPOSITION Left 04/24/2017   Procedure: LEFT ARM FIRST STAGE BASILIC VEIN TRANSPOSITION;  Surgeon: Serafina Mitchell, MD;  Location: [facility];  Service: Vascular;  Laterality: Left;   [place] Left 07/10/2017   Procedure: SECOND STAGE BASILIC VEIN TRANSPOSITION LEFT ARM;  Surgeon: Serafina Mitchell, MD;  Location: [facility];  Service: Vascular;  Laterality: Left;   BREAST LUMPECTOMY Left x 2   many years apart, benign   CHOLECYSTECTOMY  1985   CYSTOSCOPY W/ URETERAL STENT PLACEMENT Left 10/31/2021   Procedure: CYSTOSCOPY WITH RETROGRADE PYELOGRAM/URETERAL STENT PLACEMENT;  Surgeon: Ardis Hughs, MD;  Location: [place];  Service: Urology;  Laterality: Left;   CYSTOSCOPY WITH URETEROSCOPY AND STENT PLACEMENT Left 06/18/2013   Procedure: CYSTOSCOPY WITH Lef URETEROSCOPY AND Left STENT PLACEMENT;  Surgeon: Dutch Gray, MD;  Location: [facility];  Service: Urology;  Laterality: Left;   ESOPHAGOGASTRODUODENOSCOPY (EGD) WITH PROPOFOL N/A 11/13/2016   Procedure: ESOPHAGOGASTRODUODENOSCOPY (EGD) WITH PROPOFOL;  Surgeon: Otis Brace, MD;  Location: [place];  Service: Gastroenterology;  Laterality: N/A;   EUS N/A 02/07/2016   Procedure: ESOPHAGEAL ENDOSCOPIC ULTRASOUND (EUS) RADIAL;  Surgeon: Arta Silence, MD;  Location: [facility];  Service: Endoscopy;  Laterality: N/A;   EYE SURGERY Bilateral 2014   ioc for catracts    FOOT SURGERY Left 1990   something with toes unsure what    HERNIA REPAIR  2018   IR EMBO TUMOR ORGAN ISCHEMIA INFARCT INC GUIDE ROADMAPPING  08/22/2021   IR RADIOLOGIST EVAL & MGMT  05/18/2021    IR RADIOLOGIST EVAL & MGMT  10/01/2021   IR RADIOLOGIST EVAL & MGMT  11/12/2021   IR RADIOLOGIST EVAL & MGMT  11/22/2021   IR RADIOLOGIST EVAL & MGMT  11/27/2021   IR RADIOLOGIST EVAL & MGMT  12/20/2021   IR RENAL SUPRASEL UNI S&I MOD SED  08/22/2021   IR US GUIDE VASC ACCESS RIGHT  08/22/2021   LEFT HEART CATH AND CORONARY ANGIOGRAPHY N/A 08/27/2021   Procedure: LEFT HEART CATH AND CORONARY ANGIOGRAPHY;  Surgeon: Burnell Blanks, MD;  Location: [place] [facility];  Service: Cardiovascular;  Laterality: N/A;   PARATHYROID EXPLORATION     PERIPHERAL VASCULAR BALLOON ANGIOPLASTY Left 08/30/2021   Procedure: PERIPHERAL VASCULAR BALLOON ANGIOPLASTY;  Surgeon: Marty Heck, MD;  Location: [place] [facility];  Service: Cardiovascular;  Laterality: Left;  arm fistula   RADIOLOGY WITH ANESTHESIA Left 08/22/2021   Procedure: MICROWAVE ABLATION;  Surgeon: Sandi Mariscal, MD;  Location: [facility];  Service: Anesthesiology;  Laterality: Left;   WHIPPLE PROCEDURE N/A 04/23/2016   Procedure: WHIPPLE PROCEDURE;  Surgeon: Stark Klein, MD;  Location: [facility];  Service: General;  Laterality: N/A;     Current Outpatient Medications  Medication Sig Dispense Refill   acetaminophen (TYLENOL) 325 MG tablet Take 2 tablets (650 mg total) by mouth every 6 (six) hours as needed for mild pain, fever or headache.     amiodarone (PACERONE) 200 MG tablet Take 0.5 tablets (100 mg total) by mouth daily. Please keep appointment for further refills 90 tablet 3   carvedilol (COREG) 3.125 MG tablet Take 1 tablet (3.125 mg total) by mouth 2 (two) times daily with a meal. Take on the days you do not have dialysis (Patient taking differently: Take 3.125 mg by mouth 2 (two) times daily with a meal. Take on the days you do not have dialysis( Sundays, Tuesdays, Thursdays, and Saturdays)) 60 tablet 1   Cholecalciferol (VITAMIN D-3 PO) Take 1 capsule by mouth every Monday, Wednesday, and Friday with hemodialysis.     colestipol (COLESTID)  1 g tablet Take 1 g by mouth See admin instructions. 1 tablet (1g) prior to dialysis M-W-F + 1 tablet daily PRN diarrhea     diclofenac sodium (VOLTAREN) 1 % GEL Apply 2 g topically 4 (four) times daily as needed (for hand pain).     ferrous sulfate 325 (65 FE) MG tablet Take 1 tablet (325 mg total) by mouth daily with breakfast.  3   folic acid (FOLVITE) 1 MG tablet Take 1 tablet (1 mg total) by mouth daily.     hydrALAZINE (APRESOLINE) 50 MG tablet Take 1 tablet (50 mg total) by mouth 3 (three) times daily. Take on non dialysis days     insulin aspart (NOVOLOG) 100 UNIT/ML injection Inject 0-6 Units into the skin 3 (three) times daily with meals. 10 mL 11   lidocaine-prilocaine (EMLA) cream Apply 1 Application topically every Monday, Wednesday, and Friday with hemodialysis.     lipase/protease/amylase (CREON) 36000 UNITS CPEP capsule Take 36,000 Units by mouth 2 (two) times daily with a meal.     olopatadine (PATADAY) 0.1 % ophthalmic solution Place 1 drop into both eyes 2 (two) times daily.     pantoprazole (PROTONIX) 40 MG tablet Take 1 tablet (40 mg total) by mouth daily. 30 tablet 0   RESTASIS 0.05 % ophthalmic emulsion 1 drop 2 (two) times daily.     rOPINIRole (REQUIP) 0.25 MG tablet Take 0.25 mg by mouth at bedtime.     senna (SENOKOT) 8.6 MG TABS tablet Take 1 tablet (8.6 mg total) by mouth at bedtime. 120 tablet 0   sevelamer carbonate (RENVELA) 800 MG tablet Take 1,600 mg by mouth 3 (three) times daily with meals.     sodium chloride flush (NS) 0.9 % SOLN 5 mLs by Intracatheter route every 8 (eight) hours.     traMADol (ULTRAM) 50 MG tablet Take 1 tablet (50 mg total) by mouth every 12 (twelve) hours as needed for moderate pain or severe pain. 10 tablet 0   XIIDRA 5 % SOLN Place 1 drop into both eyes at bedtime.     No current facility-administered medications for this visit.    Allergies:   Lopid [gemfibrozil], Lotemax [loteprednol etabonate], Statins, Norco  [hydrocodone-acetaminophen], Other, Tomato, Vibra-tab [doxycycline], Amlodipine, Codeine, Hydrocodone, Hydrocodone-acetaminophen, Lisinopril, and Verapamil    Social History:  The patient  reports that she has never smoked. She has never used smokeless tobacco. She reports that she does  not drink alcohol and does not use drugs.   Family History:  The patient's family history includes Alzheimer's disease in her sister and sister; Cancer in her brother and father; Heart disease in her mother; Stroke in her mother.    ROS:  Please see the history of present illness.   Otherwise, review of systems are positive for none.   All other systems are reviewed and negative.    PHYSICAL EXAM: VS:  BP 116/62   Pulse 98   Ht '5\' 3"'$  (1.6 m)   Wt 132 lb 12.8 oz (60.2 kg)   SpO2 99%   BMI 23.52 kg/m  , BMI Body mass index is 23.52 kg/m. GEN: chronically ill appearing, in no acute distress.  HEENT: normal Neck: no JVD, carotid bruits, or masses Cardiac: RRR; radiated murmur from her AV fistula. no murmurs, rubs, or gallops,no edema. Functioning AV fistula in left upper arm. Respiratory:  clear to auscultation bilaterally, normal work of breathing GI: soft, nontender, nondistended, + BS MS: no deformity or atrophy Skin: warm and dry, no rash Neuro:  Strength and sensation are intact Psych: euthymic mood, full affect   EKG:  EKG is not ordered today.   Recent Labs: 09/05/2021: B Natriuretic Peptide 2,213.9 10/25/2021: TSH 1.480 01/22/2022: ALT 47; Magnesium 2.8 01/30/2022: BUN 19; Creatinine, Ser 4.55; Hemoglobin 9.8; Platelets 431; Potassium 4.0; Sodium 131    Lipid Panel    Component Value Date/Time   CHOL 95 08/26/2021 0447   CHOL 133 08/09/2021 1143   TRIG 110 08/26/2021 0447   HDL 35 (L) 08/26/2021 0447   HDL 43 08/09/2021 1143   CHOLHDL 2.7 08/26/2021 0447   VLDL 22 08/26/2021 0447   LDLCALC 38 08/26/2021 0447   LDLCALC 68 08/09/2021 1143      Wt Readings from Last 3 Encounters:   02/25/22 132 lb 12.8 oz (60.2 kg)  01/30/22 122 lb 5.7 oz (55.5 kg)  11/23/21 129 lb 9.6 oz (58.8 kg)      Other studies Reviewed: Additional studies/ records that were reviewed today include:   Echo 06/19/13: Study Conclusions   - Left ventricle: The cavity size was normal. There was    severe focal basal and moderate concentric hypertrophy.    Systolic function was normal. The estimated ejection    fraction was in the range of 60% to 65%. Wall motion was    normal; there were no regional wall motion abnormalities.  - Aortic valve: Valve area: 1.69cm^2 (Vmax).  - Mitral valve: Mild to moderate regurgitation.  - Left atrium: The atrium was mildly dilated.  - Atrial septum: There was increased thickness of the    septum, consistent with lipomatous hypertrophy.   Echo 08/27/21: IMPRESSIONS     1. Right ventricular systolic function is moderately reduced. The right  ventricular size is mildly enlarged. There is moderately elevated  pulmonary artery systolic pressure. The estimated right ventricular  systolic pressure is 16.1 mmHg. Normal apical  function with free wall hypokinesis, consistent with McConnell's sign as  can be seen in acute PE (image 58). Consider evaluation for PE.   2. Left ventricular ejection fraction, by estimation, is 60 to 65%. The  left ventricle has normal function. The left ventricle has no regional  wall motion abnormalities. Left ventricular diastolic parameters are  consistent with Grade III diastolic  dysfunction (restrictive). Elevated left atrial pressure.   3. Left atrial size was severely dilated.   4. Right atrial size was severely dilated.   5.  The mitral valve is degenerative. Mild mitral valve regurgitation. No  evidence of mitral stenosis. Moderate mitral annular calcification.   6. Tricuspid valve regurgitation is mild to moderate.   7. The aortic valve is tricuspid. Aortic valve regurgitation is not  visualized. Aortic valve sclerosis is  present, with no evidence of aortic  valve stenosis.   8. The inferior vena cava is dilated in size with <50% respiratory  variability, suggesting right atrial pressure of 15 mmHg.   Cardiac cath 08/27/21:  LEFT HEART CATH AND CORONARY ANGIOGRAPHY   Conclusion      Prox RCA lesion is 20% stenosed.   Mid LM to Dist LM lesion is 20% stenosed.   Ost LAD lesion is 30% stenosed.   Mid Cx lesion is 20% stenosed.   Prox LAD lesion is 50% stenosed.   The left ventricular systolic function is normal.   LV end diastolic pressure is normal.   The left ventricular ejection fraction is 55-65% by visual estimate.   There is no mitral valve regurgitation.   Mild non-obstructive CAD Normal LV systolic function Elevated troponin and chest pain is likely due to demand ischemic in the setting of rapid atrial fibrillation.    Recommendation: Medical management of CAD.   Coronary Diagrams  Diagnostic Dominance: Right  Interve  ASSESSMENT AND PLAN:  1.  ESRD on hemodialysis. Volume status appears OK 2. Orthostatic hypotension. This has improved and reviewed records of BP reading from home. No longer on midodrine. On low dose Coreg and hydralazine on nondialysis days. Will continue to monitor. 3. Recent subscapsular renal hematoma- on eliquis- Eliquis was stopped 4. DM 5. History of HTN 6. Renal tumor s/p ablation 7. Recent sepsis with hydronephrosis and abscess. S/p drain- chronic.  8. Nonobstructive CAD 9. Paroxysmal Afib following ablation procedure in July 2023.  Converted on amiodarone. No recurrence. Surprisingly did not have Afib during subsequent hospitalizations despite high stress. Now on amiodarone to 100 mg daily.   Given recent major bleeding episode we discussed options around stroke risk reduction. She does have a high [place] Vasc score of 7. She also has a high bleeding risk score HAS bled 5. One option would be to resume Eliquis providing repeat CT shows resolution of the  hematoma. Another option would be to forego anticoagulation and continue to try and maintain NSR with amiodarone. The third option would be to consider her for a Watchman LA occlusion device. We spent time covering benefits and risks of each approach. At this point she is in favor of staying off Eliquis, trying to maintain NSR with amiodarone and monitor. She will think about the Watchman device and if she wants to pursue this will refer to our team that does this. I will check LFTs and TSH today on amiodarone and follow up in 3 months.       Disposition:   FU with me 3 months   Signed, Ulisses Vondrak [place], MD  02/25/2022 2:10 PM    [facility] [address] [place], [place], [address] Phone [phone], Fax [phone]

## 2022-02-20 DIAGNOSIS — N2581 Secondary hyperparathyroidism of renal origin: Secondary | ICD-10-CM | POA: Diagnosis not present

## 2022-02-20 DIAGNOSIS — L299 Pruritus, unspecified: Secondary | ICD-10-CM | POA: Diagnosis not present

## 2022-02-20 DIAGNOSIS — Z992 Dependence on renal dialysis: Secondary | ICD-10-CM | POA: Diagnosis not present

## 2022-02-20 DIAGNOSIS — N186 End stage renal disease: Secondary | ICD-10-CM | POA: Diagnosis not present

## 2022-02-21 ENCOUNTER — Ambulatory Visit
Admission: RE | Admit: 2022-02-21 | Discharge: 2022-02-21 | Disposition: A | Discharge status: Home / Self Care | Payer: Medicare PPO | Source: Ambulatory Visit | Attending: Interventional Radiology | Admitting: Interventional Radiology

## 2022-02-21 DIAGNOSIS — N2889 Other specified disorders of kidney and ureter: Secondary | ICD-10-CM

## 2022-02-21 DIAGNOSIS — T85638A Leakage of other specified internal prosthetic devices, implants and grafts, initial encounter: Secondary | ICD-10-CM | POA: Diagnosis not present

## 2022-02-21 MED ORDER — IOPAMIDOL (ISOVUE-370) INJECTION 76%
10.0000 mL | Freq: Once | INTRAVENOUS | Status: AC | PRN
  Administered 2022-02-21: 10 mL

## 2022-02-21 NOTE — Progress Notes (Signed)
Patient ID: Kim Burns, female   DOB: 06-20-1935, 87 y.o.   MRN: [id number]        Chief Complaint: Left sided RCC with post op urine leak.   Referring Physician(s): Louis Meckel  History of Present Illness: Kim Burns is an 87 year old female with history of left-sided renal cell carcinoma, post endovascular embolization and microwave ablation on 08/22/2021.  Patient developed a delayed urinary leak requiring left ureteral stent placement on 10/21/2021 and ultimately placement of a left-sided perinephric drainage catheter on 11/02/2021.  Unfortunately, subsequent drainage catheter injections demonstrate a persistent urine leak with communication between the drainage catheter and the left renal collecting system.   The patient was recently admitted to the hospital with development of a left-sided subcapsular hematoma felt to be at least partially attributable to patient's anticoagulated state in the setting of end-stage renal disease, on dialysis.  The patient presents today to the interventional radiology drain clinic for drainage catheter evaluation and management.   She reports persistent output from the drainage catheter of approximately 100 cc of urine per day.   Drain injection demonstrates a persistent leak with communication between the decompressed urinoma and the left renal collecting system.  Past Medical History:  Diagnosis Date   Anemia of chronic disease    takes iron   Anxiety    Blood transfusion without reported diagnosis    Bradycardia    Diabetes mellitus without complication ([place])    became diabetic after Whipple procedure   Diarrhea    Dysrhythmia 04/16/2016   bradycardia due to medication    ESRD on hemodialysis [facility])    M-W-F   GERD (gastroesophageal reflux disease)    Gout    Headache    History of kidney stones 06/2013   Hyperparathyroidism ([place])    Hypertension    PONV (postoperative nausea and vomiting)    Sleep apnea    no cpap machine.  could not tolerate   Stroke ([place]) 04/16/2016   TIA 1995   Vitamin D deficiency     Past Surgical History:  Procedure Laterality Date   A/V FISTULAGRAM Left 08/30/2021   Procedure: A/V Fistulagram;  Surgeon: Marty Heck, MD;  Location: [place] [facility];  Service: Cardiovascular;  Laterality: Left;   ABDOMINAL HYSTERECTOMY  1985   complete   BACK SURGERY  4098   lower   BASCILIC VEIN TRANSPOSITION Left 04/24/2017   Procedure: LEFT ARM FIRST STAGE BASILIC VEIN TRANSPOSITION;  Surgeon: Serafina Mitchell, MD;  Location: [facility];  Service: Vascular;  Laterality: Left;   Ryan Left 07/10/2017   Procedure: SECOND STAGE BASILIC VEIN TRANSPOSITION LEFT ARM;  Surgeon: Serafina Mitchell, MD;  Location: [facility];  Service: Vascular;  Laterality: Left;   BREAST LUMPECTOMY Left x 2   many years apart, benign   CHOLECYSTECTOMY  1985   CYSTOSCOPY W/ URETERAL STENT PLACEMENT Left 10/31/2021   Procedure: CYSTOSCOPY WITH RETROGRADE PYELOGRAM/URETERAL STENT PLACEMENT;  Surgeon: Ardis Hughs, MD;  Location: [place];  Service: Urology;  Laterality: Left;   CYSTOSCOPY WITH URETEROSCOPY AND STENT PLACEMENT Left 06/18/2013   Procedure: CYSTOSCOPY WITH Lef URETEROSCOPY AND Left STENT PLACEMENT;  Surgeon: Dutch Gray, MD;  Location: [facility];  Service: Urology;  Laterality: Left;   ESOPHAGOGASTRODUODENOSCOPY (EGD) WITH PROPOFOL N/A 11/13/2016   Procedure: ESOPHAGOGASTRODUODENOSCOPY (EGD) WITH PROPOFOL;  Surgeon: Otis Brace, MD;  Location: [place];  Service: Gastroenterology;  Laterality: N/A;   EUS N/A 02/07/2016   Procedure: ESOPHAGEAL ENDOSCOPIC ULTRASOUND (EUS)  RADIAL;  Surgeon: Arta Silence, MD;  [facility];  Service: Endoscopy;  Laterality: N/A;   EYE SURGERY Bilateral 2014   ioc for catracts    FOOT SURGERY Left 1990   something with toes unsure what    HERNIA REPAIR  2018   IR EMBO TUMOR ORGAN ISCHEMIA INFARCT INC GUIDE ROADMAPPING  08/22/2021   IR  RADIOLOGIST EVAL & MGMT  05/18/2021   IR RADIOLOGIST EVAL & MGMT  10/01/2021   IR RADIOLOGIST EVAL & MGMT  11/12/2021   IR RADIOLOGIST EVAL & MGMT  11/22/2021   IR RADIOLOGIST EVAL & MGMT  11/27/2021   IR RADIOLOGIST EVAL & MGMT  12/20/2021   IR RENAL SUPRASEL UNI S&I MOD SED  08/22/2021   IR US GUIDE VASC ACCESS RIGHT  08/22/2021   LEFT HEART CATH AND CORONARY ANGIOGRAPHY N/A 08/27/2021   Procedure: LEFT HEART CATH AND CORONARY ANGIOGRAPHY;  Surgeon: Burnell Blanks, MD;  Location: [place] [facility];  Service: Cardiovascular;  Laterality: N/A;   PARATHYROID EXPLORATION     PERIPHERAL VASCULAR BALLOON ANGIOPLASTY Left 08/30/2021   Procedure: PERIPHERAL VASCULAR BALLOON ANGIOPLASTY;  Surgeon: Marty Heck, MD;  Location: [place] [facility];  Service: Cardiovascular;  Laterality: Left;  arm fistula   RADIOLOGY WITH ANESTHESIA Left 08/22/2021   Procedure: MICROWAVE ABLATION;  Surgeon: Sandi Mariscal, MD;  Location: [facility];  Service: Anesthesiology;  Laterality: Left;   WHIPPLE PROCEDURE N/A 04/23/2016   Procedure: WHIPPLE PROCEDURE;  Surgeon: Stark Klein, MD;  Location: [place];  Service: General;  Laterality: N/A;    Allergies: Lopid [gemfibrozil], Lotemax [loteprednol etabonate], Statins, Norco [hydrocodone-acetaminophen], Other, Tomato, Vibra-tab [doxycycline], Amlodipine, Codeine, Hydrocodone, Hydrocodone-acetaminophen, Lisinopril, and Verapamil  Medications: Prior to Admission medications   Medication Sig Start Date End Date Taking? Authorizing Provider  acetaminophen (TYLENOL) 325 MG tablet Take 2 tablets (650 mg total) by mouth every 6 (six) hours as needed for mild pain, fever or headache. 01/30/22   Elgergawy, Silver Huguenin, MD  amiodarone (PACERONE) 200 MG tablet Take 0.5 tablets (100 mg total) by mouth daily. Please keep appointment for further refills 11/23/21   Martinique, Peter M, MD  carvedilol (COREG) 3.125 MG tablet Take 1 tablet (3.125 mg total) by mouth 2 (two) times daily with a  meal. Take on the days you do not have dialysis Patient taking differently: Take 3.125 mg by mouth 2 (two) times daily with a meal. Take on the days you do not have dialysis( Sundays, Tuesdays, Thursdays, and Saturdays) 11/04/21   Mercy Riding, MD  Cholecalciferol (VITAMIN D-3 PO) Take 1 capsule by mouth every Monday, Wednesday, and Friday with hemodialysis.    [provider]  colestipol (COLESTID) 1 g tablet Take 1 g by mouth See admin instructions. 1 tablet (1g) prior to dialysis M-W-F + 1 tablet daily PRN diarrhea    [provider]  diclofenac sodium (VOLTAREN) 1 % GEL Apply 2 g topically 4 (four) times daily as needed (for hand pain).    [provider]  ferrous sulfate 325 (65 FE) MG tablet Take 1 tablet (325 mg total) by mouth daily with breakfast. 01/30/22   Elgergawy, Silver Huguenin, MD  folic acid (FOLVITE) 1 MG tablet Take 1 tablet (1 mg total) by mouth daily. 01/31/22   Elgergawy, Silver Huguenin, MD  hydrALAZINE (APRESOLINE) 50 MG tablet Take 1 tablet (50 mg total) by mouth 3 (three) times daily. 01/30/22   Elgergawy, Silver Huguenin, MD  insulin aspart (NOVOLOG) 100 UNIT/ML injection  Inject 0-6 Units into the skin 3 (three) times daily with meals. 01/30/22   Elgergawy, Silver Huguenin, MD  lidocaine-prilocaine (EMLA) cream Apply 1 Application topically every Monday, Wednesday, and Friday with hemodialysis.    [provider]  lipase/protease/amylase (CREON) 36000 UNITS CPEP capsule Take 36,000 Units by mouth 2 (two) times daily with a meal.    [provider]  Nutritional Supplements (FEEDING SUPPLEMENT, NEPRO CARB STEADY,) LIQD Take 237 mLs by mouth 2 (two) times daily between meals. 01/30/22   Elgergawy, Silver Huguenin, MD  olopatadine (PATADAY) 0.1 % ophthalmic solution Place 1 drop into both eyes 2 (two) times daily.    [provider]  pantoprazole (PROTONIX) 40 MG tablet Take 1 tablet (40 mg total) by mouth daily. 01/31/22   Elgergawy, Silver Huguenin, MD   polyethylene glycol (MIRALAX / GLYCOLAX) 17 g packet Take 17 g by mouth daily as needed for mild constipation. 01/30/22   Elgergawy, Silver Huguenin, MD  RESTASIS 0.05 % ophthalmic emulsion 1 drop 2 (two) times daily. 11/06/21   [provider]  rOPINIRole (REQUIP) 0.25 MG tablet Take 0.25 mg by mouth at bedtime. 12/11/21   [provider]  senna (SENOKOT) 8.6 MG TABS tablet Take 1 tablet (8.6 mg total) by mouth at bedtime. 01/30/22   Elgergawy, Silver Huguenin, MD  sevelamer carbonate (RENVELA) 800 MG tablet Take 1,600 mg by mouth 3 (three) times daily with meals.    [provider]  sodium chloride flush (NS) 0.9 % SOLN 5 mLs by Intracatheter route every 8 (eight) hours. 11/04/21   Mercy Riding, MD  traMADol (ULTRAM) 50 MG tablet Take 1 tablet (50 mg total) by mouth every 12 (twelve) hours as needed for moderate pain or severe pain. 01/30/22   Elgergawy, Silver Huguenin, MD  XIIDRA 5 % SOLN Place 1 drop into both eyes at bedtime. 09/24/21   [provider]     Family History  Problem Relation Age of Onset   Heart disease Mother    Stroke Mother    Cancer Father        colon   Alzheimer's disease Sister    Alzheimer's disease Sister    Cancer Brother        brain   Breast cancer Neg Hx     Social History   Socioeconomic History   Marital status: Widowed    Spouse name: Not on file   Number of children: Not on file   Years of education: Not on file   Highest education level: Not on file  Occupational History   Not on file  Tobacco Use   Smoking status: Never   Smokeless tobacco: Never  Vaping Use   Vaping Use: Never used  Substance and Sexual Activity   Alcohol use: No   Drug use: No   Sexual activity: Not on file  Other Topics Concern   Not on file  Social History Narrative   Not on file   Social Determinants of Health   Financial Resource Strain: Not on file  Food Insecurity: No Food Insecurity (11/01/2021)   Hunger Vital Sign    Worried About  Running Out of Food in the Last Year: Never true    Ran Out of Food in the Last Year: Never true  Transportation Needs: No Transportation Needs (11/01/2021)   PRAPARE - Hydrologist (Medical): No    Lack of Transportation (Non-Medical): No  Physical Activity: Not on file  Stress: Not  on file  Social Connections: Not on file    ECOG Status: 2 - Symptomatic, <50% confined to bed  Review of Systems: A 12 point ROS discussed and pertinent positives are indicated in the HPI above.  All other systems are negative.  Review of Systems  Vital Signs: BP 135/73 (BP Location: Right Arm)   Pulse 98   Temp 97.9 F (36.6 C) (Oral)   SpO2 99%    Imaging: CT HEAD WO CONTRAST (5MM)  Result Date: 01/31/2022 CLINICAL DATA:  Altered mental status EXAM: CT HEAD WITHOUT CONTRAST TECHNIQUE: Contiguous axial images were obtained from the base of the skull through the vertex without intravenous contrast. RADIATION DOSE REDUCTION: This exam was performed according to the departmental dose-optimization program which includes automated exposure control, adjustment of the mA and/or kV according to patient size and/or use of iterative reconstruction technique. COMPARISON:  08/28/21 CT Brain FINDINGS: Brain: No evidence of acute infarction, hemorrhage, hydrocephalus, extra-axial collection or mass lesion/mass effect. 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 ( Vascular: No hyperdense vessel or unexpected calcification. Skull: Normal. Negative for fracture or focal lesion. Sinuses/Orbits: Trace mucosal thickening left maxillary sinus. 1 bilateral lens replacement. Other: None. IMPRESSION: No hemorrhage or CT evidence of an acute infarct. No specific etiology for altered mental status identified. Electronically Signed   By: Marin Roberts M.D.   On: 01/31/2022 09:42   CT Angio Abd/Pel w/ and/or w/o  Result Date: 01/23/2022 CLINICAL DATA:  Acute left-sided subcapsular renal and  perinephric hemorrhage. EXAM: CT ANGIOGRAPHY ABDOMEN AND PELVIS WITH CONTRAST AND WITHOUT CONTRAST TECHNIQUE: Multidetector CT imaging of the abdomen and pelvis was performed using the standard protocol during bolus administration of intravenous contrast. Multiplanar reconstructed images and MIPs were obtained and reviewed to evaluate the vascular anatomy. RADIATION DOSE REDUCTION: This exam was performed according to the departmental dose-optimization program which includes automated exposure control, adjustment of the mA and/or kV according to patient size and/or use of iterative reconstruction technique. CONTRAST:  11m OMNIPAQUE IOHEXOL 350 MG/ML SOLN COMPARISON:  Non-contrast CT of the abdomen and pelvis on 01/22/2022 FINDINGS: VASCULAR Aorta: Atherosclerosis of the abdominal aorta without evidence of aneurysm or dissection. Celiac: Normally patent. Normally patent branch vessels and branch vessel anatomy. SMA: Normally patent. Renals: Calcified plaque at origins of bilateral single renal arteries without significant stenosis. IMA: Normally patent. Inflow: Normally patent bilateral iliac arteries without aneurysm. Proximal Outflow: Normally patent bilateral common femoral arteries and femoral bifurcations. Closure device ring on top of the right common femoral artery without mass effect or pseudoaneurysm. Veins: No venous abnormalities on venous phase imaging. Review of the MIP images confirms the above findings. NON-VASCULAR Lower chest: Small left pleural effusion and mild left basilar atelectasis. Hepatobiliary: No focal liver abnormality is seen. Status post cholecystectomy. No biliary dilatation. Pancreas: Status post Whipple procedure and pancreatectomy. Spleen: Normal in size without focal abnormality. Adrenals/Urinary Tract: Stable left posterior subcapsular and perinephric hematoma measuring approximately 9 cm in greatest diameter. This is stable when measuring at the same level on the prior unenhanced  CT. No evidence of active bleeding on arterial or venous phases of imaging. No arterial pseudoaneurysm identified. Underlying left kidney demonstrates atrophy and some degree of underlying hydronephrosis. A ureteral stent is present extending from the collecting system into the bladder. Additional percutaneous drainage catheter remains present just inferior to the lower pole collecting system without fluid collection. Stomach/Bowel: No evidence of bowel obstruction, ileus or free intraperitoneal air. Lymphatic: No enlarged abdominal or pelvic  lymph nodes. Reproductive: Status post hysterectomy. No adnexal masses. Other: No abdominal wall hernia or abnormality. No abdominopelvic ascites. Musculoskeletal: Stable degenerative disc disease of the lumbar spine. Stable osteopenia. No bony lesions or fractures. IMPRESSION: 1. Stable left posterior subcapsular and perinephric hematoma measuring approximately 9 cm in greatest diameter. No evidence of active bleeding on arterial or venous phases of imaging. No arterial pseudoaneurysm identified. 2. Underlying left renal atrophy and some degree of underlying hydronephrosis. A ureteral stent is present extending from the collecting system into the bladder. Additional percutaneous drainage catheter remains present just inferior to the lower pole collecting system. 3. Small left pleural effusion and mild left basilar atelectasis. 4. Aortic atherosclerosis. Aortic Atherosclerosis (ICD10-I70.0). Electronically Signed   By: Aletta Edouard M.D.   On: 01/23/2022 12:29    Labs:  CBC: Recent Labs    01/25/22 0327 01/26/22 0230 01/28/22 0318 01/30/22 0501  WBC 10.7* 8.9 9.1 10.4  HGB 8.0* 8.3* 9.1* 9.8*  HCT 24.1* 25.0* 28.1* 29.7*  PLT 194 241 345 431*    COAGS: Recent Labs    09/05/21 2115 10/29/21 1709 11/01/21 0630 01/22/22 0038  INR 1.3* 1.4* 1.2 1.4*  APTT  --   --   --  26    BMP: Recent Labs    01/25/22 0327 01/26/22 0230 01/28/22 0318  01/30/22 1342  NA 134* 134* 131* 131*  K 3.4* 3.6 3.8 4.0  CL 95* 95* 93* 93*  CO2 '28 26 26 26  '$ GLUCOSE 100* 142* 141* 160*  BUN 20 25* 21 19  CALCIUM 8.1* 8.4* 8.6* 8.5*  CREATININE 3.31* 4.42* 4.42* 4.55*  GFRNONAA 13* 9* 9* 9*    LIVER FUNCTION TESTS: Recent Labs    10/29/21 1709 10/30/21 0226 10/31/21 0748 01/21/22 2305 01/22/22 0506 01/28/22 0318 01/30/22 1342  BILITOT 0.6 0.7  --  0.4 0.4  --   --   AST 26 15  --  37 28  --   --   ALT 19 16  --  52* 47*  --   --   ALKPHOS 67 62  --  55 45  --   --   PROT 6.6 6.3*  --  5.6* 5.2*  --   --   ALBUMIN 2.3* 2.2*   < > 2.4* 2.3* 2.2* 2.3*   < > = values in this interval not displayed.    TUMOR MARKERS: No results for input(s): "AFPTM", "CEA", "CA199", "CHROMGRNA" in the last 8760 hours.  Assessment and Plan:  Kim Burns is an 87 year old female with history of left-sided renal cell carcinoma, post endovascular embolization and microwave ablation on 08/22/2021.  Patient developed a delayed urinary leak requiring left ureteral stent placement on 10/21/2021 and ultimately placement of a left-sided perinephric drainage catheter on 11/02/2021.  Unfortunately, subsequent drainage catheter injections (including today's injection) demonstrates a persistent urine leak with communication between the drainage catheter and the left renal collecting system.   Above situation is very complicated and unfortunate.    Despite the patient having end-stage renal disease, on dialysis, she continues to put out a fairly consistent amount daily urine from the left-sided perinephric drainage catheter.   Prolonged conversations have been held with the patient's referring urologist, Dr. Louis Meckel, regarding potential management options.   Ultimately, the patient may require embolization of the entirety of the kidney, however I am concerned this could result in significant morbidity for the patient including development of a Page kidney,  infection and/or difficulty with blood pressure  management.  As such, I will consider performing renal embolization only as last ditch effort or if she experiences a recurrent perinephric hematoma.   Following a prolonged detailed conversation with Dr. Louis Meckel, I will initially proceed with attempted internalization of the perinephric drainage catheter through the injured adjacent renal calyx.    If successful, ultimately I would like to internalize the access with placement of a nephroureteral catheter (either with urologic removal of her pre-existing ureteral stent or percutaneous removal of the ureteral stent).    If successful, I then would initiate a capping trial of this catheter which would at least allow the patient to not have to be maintained to a gravity bag.    Ultimately options could entail attempted embolization of the urine leak versus maintaining the nephroureteral catheter with routine exchanges versus simply removing the catheter if and when urine output ceases.   Above conversations were held at length with the patient the patient's daughter who understand the complexity of the situation however are in agreement with the proposed plan of care.   PLAN: - Attempted fluoroscopic guided internalization of left sided perinephric drainage catheter with conscious sedation.  This procedure will be scheduled on an out pt basis either at [facility] or [facility], and must be coordinated with her dialysis session.  A copy of this report was sent to the requesting provider on this date.  Electronically Signed: Sandi Mariscal 02/21/2022, 5:18 PM   I spent a total of 15 Minutes in face to face in clinical consultation, greater than 50% of which was counseling/coordinating care for post op urine leak.

## 2022-02-22 DIAGNOSIS — L299 Pruritus, unspecified: Secondary | ICD-10-CM | POA: Diagnosis not present

## 2022-02-22 DIAGNOSIS — N2581 Secondary hyperparathyroidism of renal origin: Secondary | ICD-10-CM | POA: Diagnosis not present

## 2022-02-22 DIAGNOSIS — N186 End stage renal disease: Secondary | ICD-10-CM | POA: Diagnosis not present

## 2022-02-22 DIAGNOSIS — Z992 Dependence on renal dialysis: Secondary | ICD-10-CM | POA: Diagnosis not present

## 2022-02-25 ENCOUNTER — Ambulatory Visit: Payer: Medicare PPO | Attending: Cardiology | Admitting: Cardiology

## 2022-02-25 VITALS — BP 116/62 | HR 98 | Ht 63.0 in | Wt 132.8 lb

## 2022-02-25 DIAGNOSIS — I951 Orthostatic hypotension: Secondary | ICD-10-CM

## 2022-02-25 DIAGNOSIS — I480 Paroxysmal atrial fibrillation: Secondary | ICD-10-CM | POA: Diagnosis not present

## 2022-02-25 DIAGNOSIS — Z992 Dependence on renal dialysis: Secondary | ICD-10-CM | POA: Diagnosis not present

## 2022-02-25 DIAGNOSIS — L299 Pruritus, unspecified: Secondary | ICD-10-CM | POA: Diagnosis not present

## 2022-02-25 DIAGNOSIS — D631 Anemia in chronic kidney disease: Secondary | ICD-10-CM

## 2022-02-25 DIAGNOSIS — N2581 Secondary hyperparathyroidism of renal origin: Secondary | ICD-10-CM | POA: Diagnosis not present

## 2022-02-25 DIAGNOSIS — N186 End stage renal disease: Secondary | ICD-10-CM

## 2022-02-25 MED ORDER — HYDRALAZINE HCL 50 MG PO TABS: 50.0000 mg | ORAL_TABLET | Freq: Three times a day (TID) | ORAL | 3 refills | Status: DC

## 2022-02-25 MED ORDER — HYDRALAZINE HCL 50 MG PO TABS: 50.0000 mg | ORAL_TABLET | Freq: Three times a day (TID) | ORAL | Status: DC

## 2022-02-25 NOTE — Addendum Note (Signed)
Addended by: Kathyrn Lass on: 02/25/2022 02:23 PM   Modules accepted: Orders

## 2022-02-26 LAB — HEPATIC FUNCTION PANEL
ALT: 36 IU/L — ABNORMAL HIGH (ref 0–32)
AST: 30 IU/L (ref 0–40)
Albumin: 3.3 g/dL — ABNORMAL LOW (ref 3.7–4.7)
Alkaline Phosphatase: 110 IU/L (ref 44–121)
Bilirubin Total: 0.3 mg/dL (ref 0.0–1.2)
Bilirubin, Direct: 0.16 mg/dL (ref 0.00–0.40)
Total Protein: 6.1 g/dL (ref 6.0–8.5)

## 2022-02-26 LAB — TSH: TSH: 2.3 u[IU]/mL (ref 0.450–4.500)

## 2022-02-27 DIAGNOSIS — N186 End stage renal disease: Secondary | ICD-10-CM | POA: Diagnosis not present

## 2022-02-27 DIAGNOSIS — Z992 Dependence on renal dialysis: Secondary | ICD-10-CM | POA: Diagnosis not present

## 2022-02-27 DIAGNOSIS — L299 Pruritus, unspecified: Secondary | ICD-10-CM | POA: Diagnosis not present

## 2022-02-27 DIAGNOSIS — N2581 Secondary hyperparathyroidism of renal origin: Secondary | ICD-10-CM | POA: Diagnosis not present

## 2022-03-02 DIAGNOSIS — N186 End stage renal disease: Secondary | ICD-10-CM | POA: Diagnosis not present

## 2022-03-02 DIAGNOSIS — Z992 Dependence on renal dialysis: Secondary | ICD-10-CM | POA: Diagnosis not present

## 2022-03-04 DIAGNOSIS — E1165 Type 2 diabetes mellitus with hyperglycemia: Secondary | ICD-10-CM | POA: Diagnosis not present

## 2022-03-05 ENCOUNTER — Other Ambulatory Visit: Payer: Self-pay | Admitting: Interventional Radiology

## 2022-03-05 DIAGNOSIS — H04121 Dry eye syndrome of right lacrimal gland: Secondary | ICD-10-CM | POA: Diagnosis not present

## 2022-03-05 DIAGNOSIS — Z992 Dependence on renal dialysis: Secondary | ICD-10-CM | POA: Diagnosis not present

## 2022-03-05 DIAGNOSIS — N2581 Secondary hyperparathyroidism of renal origin: Secondary | ICD-10-CM | POA: Diagnosis not present

## 2022-03-05 DIAGNOSIS — H04122 Dry eye syndrome of left lacrimal gland: Secondary | ICD-10-CM | POA: Diagnosis not present

## 2022-03-05 DIAGNOSIS — L299 Pruritus, unspecified: Secondary | ICD-10-CM | POA: Diagnosis not present

## 2022-03-05 DIAGNOSIS — N186 End stage renal disease: Secondary | ICD-10-CM | POA: Diagnosis not present

## 2022-03-05 DIAGNOSIS — N2889 Other specified disorders of kidney and ureter: Secondary | ICD-10-CM

## 2022-03-06 DIAGNOSIS — N186 End stage renal disease: Secondary | ICD-10-CM | POA: Diagnosis not present

## 2022-03-06 DIAGNOSIS — Z992 Dependence on renal dialysis: Secondary | ICD-10-CM | POA: Diagnosis not present

## 2022-03-06 DIAGNOSIS — N2581 Secondary hyperparathyroidism of renal origin: Secondary | ICD-10-CM | POA: Diagnosis not present

## 2022-03-06 DIAGNOSIS — L299 Pruritus, unspecified: Secondary | ICD-10-CM | POA: Diagnosis not present

## 2022-03-07 DIAGNOSIS — T82858A Stenosis of vascular prosthetic devices, implants and grafts, initial encounter: Secondary | ICD-10-CM | POA: Diagnosis not present

## 2022-03-07 DIAGNOSIS — Z992 Dependence on renal dialysis: Secondary | ICD-10-CM | POA: Diagnosis not present

## 2022-03-07 DIAGNOSIS — N186 End stage renal disease: Secondary | ICD-10-CM | POA: Diagnosis not present

## 2022-03-07 DIAGNOSIS — I871 Compression of vein: Secondary | ICD-10-CM | POA: Diagnosis not present

## 2022-03-08 DIAGNOSIS — N2581 Secondary hyperparathyroidism of renal origin: Secondary | ICD-10-CM | POA: Diagnosis not present

## 2022-03-08 DIAGNOSIS — Z992 Dependence on renal dialysis: Secondary | ICD-10-CM | POA: Diagnosis not present

## 2022-03-08 DIAGNOSIS — N186 End stage renal disease: Secondary | ICD-10-CM | POA: Diagnosis not present

## 2022-03-08 DIAGNOSIS — L299 Pruritus, unspecified: Secondary | ICD-10-CM | POA: Diagnosis not present

## 2022-03-11 DIAGNOSIS — L299 Pruritus, unspecified: Secondary | ICD-10-CM | POA: Diagnosis not present

## 2022-03-11 DIAGNOSIS — Z992 Dependence on renal dialysis: Secondary | ICD-10-CM | POA: Diagnosis not present

## 2022-03-11 DIAGNOSIS — N186 End stage renal disease: Secondary | ICD-10-CM | POA: Diagnosis not present

## 2022-03-11 DIAGNOSIS — N2581 Secondary hyperparathyroidism of renal origin: Secondary | ICD-10-CM | POA: Diagnosis not present

## 2022-03-12 ENCOUNTER — Other Ambulatory Visit: Payer: Self-pay | Admitting: Radiology

## 2022-03-12 DIAGNOSIS — C649 Malignant neoplasm of unspecified kidney, except renal pelvis: Secondary | ICD-10-CM

## 2022-03-13 ENCOUNTER — Other Ambulatory Visit: Payer: Self-pay | Admitting: Student

## 2022-03-13 DIAGNOSIS — N2581 Secondary hyperparathyroidism of renal origin: Secondary | ICD-10-CM | POA: Diagnosis not present

## 2022-03-13 DIAGNOSIS — N186 End stage renal disease: Secondary | ICD-10-CM | POA: Diagnosis not present

## 2022-03-13 DIAGNOSIS — L299 Pruritus, unspecified: Secondary | ICD-10-CM | POA: Diagnosis not present

## 2022-03-13 DIAGNOSIS — Z992 Dependence on renal dialysis: Secondary | ICD-10-CM | POA: Diagnosis not present

## 2022-03-14 ENCOUNTER — Ambulatory Visit (HOSPITAL_COMMUNITY)
Admission: RE | Admit: 2022-03-14 | Discharge: 2022-03-14 | Disposition: A | Discharge status: Home / Self Care | Payer: Medicare PPO | Source: Ambulatory Visit | Attending: Interventional Radiology | Admitting: Interventional Radiology

## 2022-03-14 ENCOUNTER — Other Ambulatory Visit: Payer: Self-pay | Admitting: Interventional Radiology

## 2022-03-14 DIAGNOSIS — N2889 Other specified disorders of kidney and ureter: Secondary | ICD-10-CM

## 2022-03-14 DIAGNOSIS — G4730 Sleep apnea, unspecified: Secondary | ICD-10-CM | POA: Diagnosis not present

## 2022-03-14 DIAGNOSIS — N186 End stage renal disease: Secondary | ICD-10-CM | POA: Insufficient documentation

## 2022-03-14 DIAGNOSIS — D631 Anemia in chronic kidney disease: Secondary | ICD-10-CM | POA: Diagnosis not present

## 2022-03-14 DIAGNOSIS — Z992 Dependence on renal dialysis: Secondary | ICD-10-CM | POA: Insufficient documentation

## 2022-03-14 DIAGNOSIS — Z85528 Personal history of other malignant neoplasm of kidney: Secondary | ICD-10-CM | POA: Diagnosis not present

## 2022-03-14 DIAGNOSIS — R32 Unspecified urinary incontinence: Secondary | ICD-10-CM | POA: Diagnosis not present

## 2022-03-14 DIAGNOSIS — E1122 Type 2 diabetes mellitus with diabetic chronic kidney disease: Secondary | ICD-10-CM | POA: Diagnosis not present

## 2022-03-14 DIAGNOSIS — R001 Bradycardia, unspecified: Secondary | ICD-10-CM | POA: Diagnosis not present

## 2022-03-14 DIAGNOSIS — Z794 Long term (current) use of insulin: Secondary | ICD-10-CM | POA: Insufficient documentation

## 2022-03-14 DIAGNOSIS — Z4803 Encounter for change or removal of drains: Secondary | ICD-10-CM | POA: Diagnosis not present

## 2022-03-14 DIAGNOSIS — I120 Hypertensive chronic kidney disease with stage 5 chronic kidney disease or end stage renal disease: Secondary | ICD-10-CM | POA: Insufficient documentation

## 2022-03-14 DIAGNOSIS — C649 Malignant neoplasm of unspecified kidney, except renal pelvis: Secondary | ICD-10-CM

## 2022-03-14 HISTORY — PX: IR CATHETER TUBE CHANGE: IMG717

## 2022-03-14 HISTORY — PX: IR NEPHROSTOMY PLACEMENT LEFT: IMG6063

## 2022-03-14 LAB — BASIC METABOLIC PANEL
Anion gap: 12 (ref 5–15)
BUN: 30 mg/dL — ABNORMAL HIGH (ref 8–23)
CO2: 29 mmol/L (ref 22–32)
Calcium: 8.2 mg/dL — ABNORMAL LOW (ref 8.9–10.3)
Chloride: 100 mmol/L (ref 98–111)
Creatinine, Ser: 4.17 mg/dL — ABNORMAL HIGH (ref 0.44–1.00)
GFR, Estimated: 10 mL/min — ABNORMAL LOW (ref >60)
Glucose, Bld: 120 mg/dL — ABNORMAL HIGH (ref 70–99)
Potassium: 4 mmol/L (ref 3.5–5.1)
Sodium: 141 mmol/L (ref 135–145)

## 2022-03-14 LAB — CBC
HCT: 35.1 % — ABNORMAL LOW (ref 36.0–46.0)
Hemoglobin: 11 g/dL — ABNORMAL LOW (ref 12.0–15.0)
MCH: 29 pg (ref 26.0–34.0)
MCHC: 31.3 g/dL (ref 30.0–36.0)
MCV: 92.6 fL (ref 80.0–100.0)
Platelets: 218 10*3/uL (ref 150–400)
RBC: 3.79 MIL/uL — ABNORMAL LOW (ref 3.87–5.11)
RDW: 17.1 % — ABNORMAL HIGH (ref 11.5–15.5)
WBC: 6.9 10*3/uL (ref 4.0–10.5)
nRBC: 0 % (ref 0.0–0.2)

## 2022-03-14 LAB — GLUCOSE, CAPILLARY: Glucose-Capillary: 124 mg/dL — ABNORMAL HIGH (ref 70–99)

## 2022-03-14 LAB — PROTIME-INR
INR: 1 (ref 0.8–1.2)
Prothrombin Time: 13.4 seconds (ref 11.4–15.2)

## 2022-03-14 MED ORDER — MIDAZOLAM HCL 2 MG/2ML IJ SOLN
INTRAMUSCULAR | Status: AC
  Filled 2022-03-14: qty 2

## 2022-03-14 MED ORDER — SODIUM CHLORIDE 0.9 % IV SOLN
INTRAVENOUS | Status: AC
  Administered 2022-03-14: 2 g via INTRAVENOUS
  Filled 2022-03-14: qty 20

## 2022-03-14 MED ORDER — FENTANYL CITRATE (PF) 100 MCG/2ML IJ SOLN
INTRAMUSCULAR | Status: AC
  Filled 2022-03-14: qty 2

## 2022-03-14 MED ORDER — ACETAMINOPHEN 325 MG PO TABS
ORAL_TABLET | ORAL | Status: AC
  Administered 2022-03-14: 650 mg
  Filled 2022-03-14: qty 2

## 2022-03-14 MED ORDER — MIDAZOLAM HCL 2 MG/2ML IJ SOLN
INTRAMUSCULAR | Status: AC | PRN
  Administered 2022-03-14: 1 mg via INTRAVENOUS

## 2022-03-14 MED ORDER — IOHEXOL 300 MG/ML  SOLN
50.0000 mL | Freq: Once | INTRAMUSCULAR | Status: AC | PRN
  Administered 2022-03-14: 22 mL

## 2022-03-14 MED ORDER — FENTANYL CITRATE (PF) 100 MCG/2ML IJ SOLN
INTRAMUSCULAR | Status: AC | PRN
  Administered 2022-03-14: 50 ug via INTRAVENOUS
  Administered 2022-03-14: 25 ug via INTRAVENOUS

## 2022-03-14 MED ORDER — LIDOCAINE HCL 1 % IJ SOLN
INTRAMUSCULAR | Status: AC
  Administered 2022-03-14: 10 mL
  Filled 2022-03-14: qty 20

## 2022-03-14 MED ORDER — SODIUM CHLORIDE 0.9 % IV SOLN: INTRAVENOUS | Status: DC

## 2022-03-14 MED ORDER — SODIUM CHLORIDE 0.9 % IV SOLN: 2.0000 g | INTRAVENOUS | Status: AC

## 2022-03-14 NOTE — Procedures (Signed)
Pre Procedure Dx: Urine leak Post Procedural Dx: Same  Successful fluoroscopic guided placement of a left sided 8 Fr Bettey Mare Type drain into the left kidney via previously injured left inferior pole calyx.  PCN connected to gravity bag.  Successful fluoro guided exchange of 10 Fr perinephric drainage catheter.   Drain re-connected to gravity bag.  EBL: Minimal  Complications: None immediate.  Ronny Bacon, MD Pager #: [phone]

## 2022-03-14 NOTE — H&P (Signed)
Chief Complaint: Patient was seen in consultation today for urinary leak of left kidney  Referring Physician(s): Watts,John  Supervising Physician: Sandi Mariscal  Patient Status: [facility] - Out-pt  History of Present Illness: Kim ROUSSELL is a 87 y.o. female with PMH significant for anemia of chronic disease, bradycardia, diabetes mellitus, ESRD on dialysis, left-sided renal cell carcinoma, and sleep apnea being seen today in relation to a left-sided urine leak from the kidney. The patient is s/p endovascular embolization and microwave ablation of left renal mass on 08/22/21 and subsequently developed a urinary leak. The patient has an existing nephroureteral stent placed by Urology on 10/21/21 and a left-sided perinephric drainage catheter placed by IR 11/02/21. As of a clinic visit on 02/21/22, the patient reports persistent drainage of approximately 100cc of urine per day into her drain.  The patient presents today in her usual state of health for image-guided perinephric drain exchange with possible internalization of existing perinephric drain vs nephrostomy tube placement with possible removal of existing nephroureteral stent and placement of new nephroureteral stent.   Past Medical History:  Diagnosis Date   Anemia of chronic disease    takes iron   Anxiety    Blood transfusion without reported diagnosis    Bradycardia    Diabetes mellitus without complication ([place])    became diabetic after Whipple procedure   Diarrhea    Dysrhythmia 04/16/2016   bradycardia due to medication    ESRD on hemodialysis [facility])    M-W-F   GERD (gastroesophageal reflux disease)    Gout    Headache    History of kidney stones 06/2013   Hyperparathyroidism ([place])    Hypertension    PONV (postoperative nausea and vomiting)    Sleep apnea    no cpap machine. could not tolerate   Stroke ([place]) 04/16/2016   TIA 1995   Vitamin D deficiency     Past Surgical History:  Procedure Laterality Date   A/V  FISTULAGRAM Left 08/30/2021   Procedure: A/V Fistulagram;  Surgeon: Marty Heck, MD;  Location: [place] [facility];  Service: Cardiovascular;  Laterality: Left;   ABDOMINAL HYSTERECTOMY  1985   complete   BACK SURGERY  6720   lower   BASCILIC VEIN TRANSPOSITION Left 04/24/2017   Procedure: LEFT ARM FIRST STAGE BASILIC VEIN TRANSPOSITION;  Surgeon: Serafina Mitchell, MD;  Location: [facility] OR;  Service: Vascular;  Laterality: Left;   [place] Left 07/10/2017   Procedure: SECOND STAGE BASILIC VEIN TRANSPOSITION LEFT ARM;  Surgeon: Serafina Mitchell, MD;  Location: [facility] OR;  Service: Vascular;  Laterality: Left;   BREAST LUMPECTOMY Left x 2   many years apart, benign   CHOLECYSTECTOMY  1985   CYSTOSCOPY W/ URETERAL STENT PLACEMENT Left 10/31/2021   Procedure: CYSTOSCOPY WITH RETROGRADE PYELOGRAM/URETERAL STENT PLACEMENT;  Surgeon: Ardis Hughs, MD;  Location: [place];  Service: Urology;  Laterality: Left;   CYSTOSCOPY WITH URETEROSCOPY AND STENT PLACEMENT Left 06/18/2013   Procedure: CYSTOSCOPY WITH Lef URETEROSCOPY AND Left STENT PLACEMENT;  Surgeon: Dutch Gray, MD;  Location: [facility];  Service: Urology;  Laterality: Left;   ESOPHAGOGASTRODUODENOSCOPY (EGD) WITH PROPOFOL N/A 11/13/2016   Procedure: ESOPHAGOGASTRODUODENOSCOPY (EGD) WITH PROPOFOL;  Surgeon: Otis Brace, MD;  Location: [place];  Service: Gastroenterology;  Laterality: N/A;   EUS N/A 02/07/2016   Procedure: ESOPHAGEAL ENDOSCOPIC ULTRASOUND (EUS) RADIAL;  Surgeon: Arta Silence, MD;  Location: [facility];  Service: Endoscopy;  Laterality: N/A;   EYE SURGERY Bilateral 2014  ioc for catracts    FOOT SURGERY Left 1990   something with toes unsure what    HERNIA REPAIR  2018   IR EMBO TUMOR ORGAN ISCHEMIA INFARCT INC GUIDE ROADMAPPING  08/22/2021   IR RADIOLOGIST EVAL & MGMT  05/18/2021   IR RADIOLOGIST EVAL & MGMT  10/01/2021   IR RADIOLOGIST EVAL & MGMT  11/12/2021   IR RADIOLOGIST EVAL & MGMT   11/22/2021   IR RADIOLOGIST EVAL & MGMT  11/27/2021   IR RADIOLOGIST EVAL & MGMT  12/20/2021   IR RENAL SUPRASEL UNI S&I MOD SED  08/22/2021   IR US GUIDE VASC ACCESS RIGHT  08/22/2021   LEFT HEART CATH AND CORONARY ANGIOGRAPHY N/A 08/27/2021   Procedure: LEFT HEART CATH AND CORONARY ANGIOGRAPHY;  Surgeon: Burnell Blanks, MD;  Location: [place] [facility];  Service: Cardiovascular;  Laterality: N/A;   PARATHYROID EXPLORATION     PERIPHERAL VASCULAR BALLOON ANGIOPLASTY Left 08/30/2021   Procedure: PERIPHERAL VASCULAR BALLOON ANGIOPLASTY;  Surgeon: Marty Heck, MD;  Location: [place] [facility];  Service: Cardiovascular;  Laterality: Left;  arm fistula   RADIOLOGY WITH ANESTHESIA Left 08/22/2021   Procedure: MICROWAVE ABLATION;  Surgeon: Sandi Mariscal, MD;  Location: [facility];  Service: Anesthesiology;  Laterality: Left;   WHIPPLE PROCEDURE N/A 04/23/2016   Procedure: WHIPPLE PROCEDURE;  Surgeon: Stark Klein, MD;  Location: [place];  Service: General;  Laterality: N/A;    Allergies: Lopid [gemfibrozil], Lotemax [loteprednol etabonate], Statins, Norco [hydrocodone-acetaminophen], Other, Tomato, Vibra-tab [doxycycline], Amlodipine, Codeine, Hydrocodone, Hydrocodone-acetaminophen, Lisinopril, and Verapamil  Medications: Prior to Admission medications   Medication Sig Start Date End Date Taking? Authorizing Provider  acetaminophen (TYLENOL) 325 MG tablet Take 2 tablets (650 mg total) by mouth every 6 (six) hours as needed for mild pain, fever or headache. 01/30/22  Yes Elgergawy, Silver Huguenin, MD  amiodarone (PACERONE) 200 MG tablet Take 0.5 tablets (100 mg total) by mouth daily. Please keep appointment for further refills 11/23/21  Yes Martinique, Peter M, MD  carvedilol (COREG) 3.125 MG tablet Take 1 tablet (3.125 mg total) by mouth 2 (two) times daily with a meal. Take on the days you do not have dialysis Patient taking differently: Take 3.125 mg by mouth 2 (two) times daily with a meal. Take on the  days you do not have dialysis( Sundays, Tuesdays, Thursdays, and Saturdays) 11/04/21  Yes Wendee Beavers T, MD  Cholecalciferol (VITAMIN D-3 PO) Take 1 capsule by mouth every Monday, Wednesday, and Friday with hemodialysis.   Yes [provider]  folic acid (FOLVITE) 1 MG tablet Take 1 tablet (1 mg total) by mouth daily. 01/31/22  Yes Elgergawy, Silver Huguenin, MD  hydrALAZINE (APRESOLINE) 50 MG tablet Take 1 tablet (50 mg total) by mouth 3 (three) times daily. Take on non dialysis days 02/25/22  Yes Martinique, Peter M, MD  lidocaine-prilocaine (EMLA) cream Apply 1 Application topically every Monday, Wednesday, and Friday with hemodialysis.   Yes [provider]  lipase/protease/amylase (CREON) 36000 UNITS CPEP capsule Take 36,000 Units by mouth 2 (two) times daily with a meal.   Yes [provider]  pantoprazole (PROTONIX) 40 MG tablet Take 1 tablet (40 mg total) by mouth daily. 01/31/22  Yes Elgergawy, Silver Huguenin, MD  rOPINIRole (REQUIP) 0.25 MG tablet Take 0.25 mg by mouth at bedtime. 12/11/21  Yes [provider]  sevelamer carbonate (RENVELA) 800 MG tablet Take 1,600 mg by mouth 3 (three) times daily with meals.   Yes [provider]  colestipol (COLESTID) 1 g tablet Take 1 g by mouth See admin instructions. 1 tablet (1g) prior to dialysis M-W-F + 1 tablet daily PRN diarrhea    [provider]  diclofenac sodium (VOLTAREN) 1 % GEL Apply 2 g topically 4 (four) times daily as needed (for hand pain).    [provider]  ferrous sulfate 325 (65 FE) MG tablet Take 1 tablet (325 mg total) by mouth daily with breakfast. 01/30/22   Elgergawy, Silver Huguenin, MD  insulin aspart (NOVOLOG) 100 UNIT/ML injection Inject 0-6 Units into the skin 3 (three) times daily with meals. 01/30/22   Elgergawy, Silver Huguenin, MD  olopatadine (PATADAY) 0.1 % ophthalmic solution Place 1 drop into both eyes 2 (two) times daily.    [provider]  RESTASIS 0.05 % ophthalmic emulsion  1 drop 2 (two) times daily. 11/06/21   [provider]  senna (SENOKOT) 8.6 MG TABS tablet Take 1 tablet (8.6 mg total) by mouth at bedtime. 01/30/22   Elgergawy, Silver Huguenin, MD  sodium chloride flush (NS) 0.9 % SOLN 5 mLs by Intracatheter route every 8 (eight) hours. 11/04/21   Mercy Riding, MD  traMADol (ULTRAM) 50 MG tablet Take 1 tablet (50 mg total) by mouth every 12 (twelve) hours as needed for moderate pain or severe pain. 01/30/22   Elgergawy, Silver Huguenin, MD  XIIDRA 5 % SOLN Place 1 drop into both eyes at bedtime. 09/24/21   [provider]     Family History  Problem Relation Age of Onset   Heart disease Mother    Stroke Mother    Cancer Father        colon   Alzheimer's disease Sister    Alzheimer's disease Sister    Cancer Brother        brain   Breast cancer Neg Hx     Social History   Socioeconomic History   Marital status: Widowed    Spouse name: Not on file   Number of children: Not on file   Years of education: Not on file   Highest education level: Not on file  Occupational History   Not on file  Tobacco Use   Smoking status: Never   Smokeless tobacco: Never  Vaping Use   Vaping Use: Never used  Substance and Sexual Activity   Alcohol use: No   Drug use: No   Sexual activity: Not on file  Other Topics Concern   Not on file  Social History Narrative   Not on file   Social Determinants of Health   Financial Resource Strain: Not on file  Food Insecurity: No Food Insecurity (11/01/2021)   Hunger Vital Sign    Worried About Running Out of Food in the Last Year: Never true    Ran Out of Food in the Last Year: Never true  Transportation Needs: No Transportation Needs (11/01/2021)   PRAPARE - Hydrologist (Medical): No    Lack of Transportation (Non-Medical): No  Physical Activity: Not on file  Stress: Not on file  Social Connections: Not on file    Review of Systems: A 12 point ROS discussed and pertinent  positives are indicated in the HPI above.  All other systems are negative.  Review of Systems  Constitutional:  Negative for chills and fatigue.  Respiratory:  Negative for chest tightness and shortness of breath.   Cardiovascular:  Positive for leg swelling. Negative for chest pain.  Gastrointestinal:  Negative for  abdominal pain, diarrhea, nausea and vomiting.  Neurological:  Negative for dizziness and headaches.  Psychiatric/Behavioral:  Negative for confusion.     Vital Signs: BP (!) 146/78   Pulse 81   Temp (!) 97.1 F (36.2 C) (Temporal)   Resp 16   Ht '5\' 3"'$  (1.6 m)   Wt 132 lb 4.4 oz (60 kg)   SpO2 97%   BMI 23.43 kg/m    Physical Exam Vitals reviewed.  Cardiovascular:     Rate and Rhythm: Normal rate and regular rhythm.     Pulses: Normal pulses.     Heart sounds: Normal heart sounds.  Pulmonary:     Effort: Pulmonary effort is normal.     Breath sounds: Normal breath sounds.  Abdominal:     General: Bowel sounds are normal.     Palpations: Abdomen is soft.     Tenderness: There is no abdominal tenderness.  Musculoskeletal:     Right lower leg: Edema present.     Left lower leg: Edema present.  Skin:    General: Skin is warm and dry.  Neurological:     Mental Status: She is alert and oriented to person, place, and time.  Psychiatric:        Mood and Affect: Mood normal.        Behavior: Behavior normal.     Imaging: DG Sinus/Fist Tube Chk-Non GI  Result Date: 02/21/2022 CLINICAL DATA:  87 year old female with history of left-sided renal cell carcinoma, post endovascular embolization and microwave ablation on 08/22/2021. Patient developed a delayed urinary leak requiring left ureteral stent placement on 10/21/2021 and ultimately placement of a left-sided perinephric drainage catheter on 11/02/2021. Unfortunately, subsequent drainage catheter injections demonstrate a persistent urine leak with communication between the drainage catheter and the left renal  collecting system. Patient was recently admitted to the hospital with development of a left-sided subcapsular hematoma felt to be at least partially attributable to patient's anticoagulated state in the setting of end-stage renal disease, on dialysis. The patient presents today to the interventional radiology drain clinic for drainage catheter evaluation and management. She reports persistent output from the drainage catheter of approximately 100 cc of urine per day. EXAM: ABSCESS INJECTION COMPARISON:  CT abdomen pelvis-01/23/2022 Multiple previous drainage catheter injections, most recently on 12/20/2021 CONTRAST:  10 cc Omnipaque 300 - administered via the existing percutaneous drain. FLUOROSCOPY: 16 seconds (1.6 mGy) TECHNIQUE: The patient was positioned prone on the fluoroscopy table. A preprocedural spot fluoroscopic image was obtained of the left flank and existing perinephric drainage catheter and the existing percutaneous drainage catheter. Multiple spot fluoroscopic and radiographic images were obtained following the injection of a small amount of contrast via the existing percutaneous drainage catheter. Images were reviewed and discussed with the patient and the patient's daughter. The drainage catheter was flushed with a small amount of saline and reconnected to a gravity bag. A dressing was applied. The patient tolerated the procedure well without immediate postprocedural complication. FINDINGS: Preprocedural spot fluoroscopic image demonstrates stable positioning of left-sided perinephric drainage catheter with end coiled and locked overlying the expected location of the inferior pole of the left kidney. The superior end of the left-sided ureteral stent overlies expected location of the superior aspect of the left renal collecting system. Embolization coils overlie the mid/inferior pole of the left kidney. Drainage catheter injection demonstrates brisk communication between the decompressed perinephric  urinoma and the left inferior pole renal collecting system. IMPRESSION: Drain injection demonstrates a persistent leak with communication  between the decompressed urinoma and the left renal collecting system. Electronically Signed   By: Sandi Mariscal M.D.   On: 02/21/2022 17:18    Labs:  CBC: Recent Labs    01/25/22 0327 01/26/22 0230 01/28/22 0318 01/30/22 0501  WBC 10.7* 8.9 9.1 10.4  HGB 8.0* 8.3* 9.1* 9.8*  HCT 24.1* 25.0* 28.1* 29.7*  PLT 194 241 345 431*    COAGS: Recent Labs    09/05/21 2115 10/29/21 1709 11/01/21 0630 01/22/22 0038  INR 1.3* 1.4* 1.2 1.4*  APTT  --   --   --  26    BMP: Recent Labs    01/25/22 0327 01/26/22 0230 01/28/22 0318 01/30/22 1342  NA 134* 134* 131* 131*  K 3.4* 3.6 3.8 4.0  CL 95* 95* 93* 93*  CO2 '28 26 26 26  '$ GLUCOSE 100* 142* 141* 160*  BUN 20 25* 21 19  CALCIUM 8.1* 8.4* 8.6* 8.5*  CREATININE 3.31* 4.42* 4.42* 4.55*  GFRNONAA 13* 9* 9* 9*    LIVER FUNCTION TESTS: Recent Labs    10/30/21 0226 10/31/21 0748 01/21/22 2305 01/22/22 0506 01/28/22 0318 01/30/22 1342 02/25/22 1426  BILITOT 0.7  --  0.4 0.4  --   --  0.3  AST 15  --  37 28  --   --  30  ALT 16  --  52* 47*  --   --  36*  ALKPHOS 62  --  55 45  --   --  110  PROT 6.3*  --  5.6* 5.2*  --   --  6.1  ALBUMIN 2.2*   < > 2.4* 2.3* 2.2* 2.3* 3.3*   < > = values in this interval not displayed.    TUMOR MARKERS: No results for input(s): "AFPTM", "CEA", "CA199", "CHROMGRNA" in the last 8760 hours.  Assessment and Plan:  Mical Brun is an extremely pleasant 87 yo female being seen today in relation to a left-sided urinary leak from the kidney. The patient has been followed by IR since July of 2023 for her initial ablation of left-sided renal cell carcinoma. The patient today presents in good spirits and her usual state of health. The case has been reviewed and approved by Dr Pascal Lux for image-guided perinephric drain exchange with possible internalization of  existing perinephric drain vs nephrostomy tube placement with possible removal of existing nephroureteral stent and placement of new nephroureteral stent.  Risks and benefits of left-sided drain placement/exchange with possible stent placement were discussed with the patient including, but not limited to, infection, bleeding, significant bleeding causing loss or decrease in renal function or damage to adjacent structures.   All of the patient's questions were answered, patient is agreeable to proceed.  Consent signed and in chart.        Thank you for this interesting consult.  I greatly enjoyed meeting JIMMIE DATTILIO and look forward to participating in their care.  A copy of this report was sent to the requesting provider on this date.  Electronically Signed: Lura Em, PA-C 03/14/2022, 11:22 AM   I spent a total of   25 Minutes in face to face in clinical consultation, greater than 50% of which was counseling/coordinating care for urinary leak.

## 2022-03-15 ENCOUNTER — Other Ambulatory Visit: Payer: Self-pay | Admitting: Interventional Radiology

## 2022-03-15 DIAGNOSIS — N2889 Other specified disorders of kidney and ureter: Secondary | ICD-10-CM

## 2022-03-16 DIAGNOSIS — L299 Pruritus, unspecified: Secondary | ICD-10-CM | POA: Diagnosis not present

## 2022-03-16 DIAGNOSIS — Z992 Dependence on renal dialysis: Secondary | ICD-10-CM | POA: Diagnosis not present

## 2022-03-16 DIAGNOSIS — N2581 Secondary hyperparathyroidism of renal origin: Secondary | ICD-10-CM | POA: Diagnosis not present

## 2022-03-16 DIAGNOSIS — N186 End stage renal disease: Secondary | ICD-10-CM | POA: Diagnosis not present

## 2022-03-18 DIAGNOSIS — E213 Hyperparathyroidism, unspecified: Secondary | ICD-10-CM | POA: Diagnosis not present

## 2022-03-18 DIAGNOSIS — I10 Essential (primary) hypertension: Secondary | ICD-10-CM | POA: Diagnosis not present

## 2022-03-18 DIAGNOSIS — L299 Pruritus, unspecified: Secondary | ICD-10-CM | POA: Diagnosis not present

## 2022-03-18 DIAGNOSIS — D649 Anemia, unspecified: Secondary | ICD-10-CM | POA: Diagnosis not present

## 2022-03-18 DIAGNOSIS — N186 End stage renal disease: Secondary | ICD-10-CM | POA: Diagnosis not present

## 2022-03-18 DIAGNOSIS — Z992 Dependence on renal dialysis: Secondary | ICD-10-CM | POA: Diagnosis not present

## 2022-03-18 DIAGNOSIS — N2581 Secondary hyperparathyroidism of renal origin: Secondary | ICD-10-CM | POA: Diagnosis not present

## 2022-03-20 DIAGNOSIS — Z992 Dependence on renal dialysis: Secondary | ICD-10-CM | POA: Diagnosis not present

## 2022-03-20 DIAGNOSIS — N186 End stage renal disease: Secondary | ICD-10-CM | POA: Diagnosis not present

## 2022-03-20 DIAGNOSIS — L299 Pruritus, unspecified: Secondary | ICD-10-CM | POA: Diagnosis not present

## 2022-03-20 DIAGNOSIS — I158 Other secondary hypertension: Secondary | ICD-10-CM | POA: Diagnosis not present

## 2022-03-20 DIAGNOSIS — N2581 Secondary hyperparathyroidism of renal origin: Secondary | ICD-10-CM | POA: Diagnosis not present

## 2022-03-21 DIAGNOSIS — M1990 Unspecified osteoarthritis, unspecified site: Secondary | ICD-10-CM | POA: Diagnosis not present

## 2022-03-21 DIAGNOSIS — Z794 Long term (current) use of insulin: Secondary | ICD-10-CM | POA: Diagnosis not present

## 2022-03-21 DIAGNOSIS — G2581 Restless legs syndrome: Secondary | ICD-10-CM | POA: Diagnosis not present

## 2022-03-21 DIAGNOSIS — H3530 Unspecified macular degeneration: Secondary | ICD-10-CM | POA: Diagnosis not present

## 2022-03-21 DIAGNOSIS — D6869 Other thrombophilia: Secondary | ICD-10-CM | POA: Diagnosis not present

## 2022-03-21 DIAGNOSIS — I120 Hypertensive chronic kidney disease with stage 5 chronic kidney disease or end stage renal disease: Secondary | ICD-10-CM | POA: Diagnosis not present

## 2022-03-21 DIAGNOSIS — H04129 Dry eye syndrome of unspecified lacrimal gland: Secondary | ICD-10-CM | POA: Diagnosis not present

## 2022-03-21 DIAGNOSIS — I4891 Unspecified atrial fibrillation: Secondary | ICD-10-CM | POA: Diagnosis not present

## 2022-03-22 ENCOUNTER — Ambulatory Visit
Admission: RE | Admit: 2022-03-22 | Discharge: 2022-03-22 | Disposition: A | Discharge status: Home / Self Care | Payer: Medicare PPO | Source: Ambulatory Visit | Attending: Interventional Radiology | Admitting: Interventional Radiology

## 2022-03-22 ENCOUNTER — Other Ambulatory Visit: Payer: Self-pay | Admitting: Interventional Radiology

## 2022-03-22 DIAGNOSIS — N2889 Other specified disorders of kidney and ureter: Secondary | ICD-10-CM

## 2022-03-22 DIAGNOSIS — Z992 Dependence on renal dialysis: Secondary | ICD-10-CM | POA: Diagnosis not present

## 2022-03-22 DIAGNOSIS — Z85528 Personal history of other malignant neoplasm of kidney: Secondary | ICD-10-CM | POA: Diagnosis not present

## 2022-03-22 DIAGNOSIS — L299 Pruritus, unspecified: Secondary | ICD-10-CM | POA: Diagnosis not present

## 2022-03-22 DIAGNOSIS — Z466 Encounter for fitting and adjustment of urinary device: Secondary | ICD-10-CM | POA: Diagnosis not present

## 2022-03-22 DIAGNOSIS — S37012A Minor contusion of left kidney, initial encounter: Secondary | ICD-10-CM | POA: Diagnosis not present

## 2022-03-22 DIAGNOSIS — N281 Cyst of kidney, acquired: Secondary | ICD-10-CM | POA: Diagnosis not present

## 2022-03-22 DIAGNOSIS — N99840 Postprocedural hematoma of a genitourinary system organ or structure following a genitourinary system procedure: Secondary | ICD-10-CM | POA: Diagnosis not present

## 2022-03-22 DIAGNOSIS — N186 End stage renal disease: Secondary | ICD-10-CM | POA: Diagnosis not present

## 2022-03-22 DIAGNOSIS — N2581 Secondary hyperparathyroidism of renal origin: Secondary | ICD-10-CM | POA: Diagnosis not present

## 2022-03-22 MED ORDER — IOPAMIDOL (ISOVUE-300) INJECTION 61%
5.0000 mL | Freq: Once | INTRAVENOUS | Status: AC | PRN
  Administered 2022-03-22: 5 mL

## 2022-03-22 MED ORDER — IOPAMIDOL (ISOVUE-300) INJECTION 61%
100.0000 mL | Freq: Once | INTRAVENOUS | Status: AC | PRN
  Administered 2022-03-22: 100 mL via INTRAVENOUS

## 2022-03-22 NOTE — Progress Notes (Signed)
Patient ID: Kim Burns, female   DOB: May 26, 1935, 87 y.o.   MRN: [id number]        Chief Complaint: Patient was seen in consultation today for No chief complaint on file.  at the request of Juliet Rude  Referring Physician(s): Albin Felling J  History of Present Illness: Kim Burns is a 87 y.o. female MH significant for anemia of chronic disease, bradycardia, diabetes mellitus, ESRD on dialysis, left-sided renal cell carcinoma post left-sided renal cell carcinoma, post endovascular embolization and microwave ablation on 08/22/2021, who present today to drain clinic for drainage catheter evaluation and management.  The patient developed development of a delayed urinary leak, ultimately requiring placement of a left-sided double-J ureteral stent on 10/21/2021 and ultimately a left-sided perinephric drainage catheter on 11/02/2021.  She was subsequently admitted with development of a left-sided subcapsular renal hematoma and ultimately, despite prolonged conservative management, the patient's urinary leak has persisted.  The patient underwent fluoroscopic guided exchange of the left-sided perinephric drainage catheter, placement of a left-sided percutaneous nephrostomy catheter as well as attempted placement of a nephroureteral catheter and retrieval of double-J ureteral stent on 03/14/2022.  She returns to the interventional radiology drain clinic for drainage catheter evaluation and management.  The patient reports persistent large volume output (of approximally 100 to 200 cc per day) from the perinephric drainage catheter with small volume output (25-50 cc per day) from the percutaneous nephrostomy catheter.    Past Medical History:  Diagnosis Date   Anemia of chronic disease    takes iron   Anxiety    Blood transfusion without reported diagnosis    Bradycardia    Diabetes mellitus without complication ([place])    became diabetic after Whipple procedure   Diarrhea     Dysrhythmia 04/16/2016   bradycardia due to medication    ESRD on hemodialysis [facility])    M-W-F   GERD (gastroesophageal reflux disease)    Gout    Headache    History of kidney stones 06/2013   Hyperparathyroidism ([place])    Hypertension    PONV (postoperative nausea and vomiting)    Sleep apnea    no cpap machine. could not tolerate   Stroke ([place]) 04/16/2016   TIA 1995   Vitamin D deficiency     Past Surgical History:  Procedure Laterality Date   A/V FISTULAGRAM Left 08/30/2021   Procedure: A/V Fistulagram;  Surgeon: Marty Heck, MD;  Location: [place] [facility];  Service: Cardiovascular;  Laterality: Left;   ABDOMINAL HYSTERECTOMY  1985   complete   BACK SURGERY  6010   lower   BASCILIC VEIN TRANSPOSITION Left 04/24/2017   Procedure: LEFT ARM FIRST STAGE BASILIC VEIN TRANSPOSITION;  Surgeon: Serafina Mitchell, MD;  Location: [facility];  Service: Vascular;  Laterality: Left;   [place] Left 07/10/2017   Procedure: SECOND STAGE BASILIC VEIN TRANSPOSITION LEFT ARM;  Surgeon: Serafina Mitchell, MD;  Location: [facility];  Service: Vascular;  Laterality: Left;   BREAST LUMPECTOMY Left x 2   many years apart, benign   CHOLECYSTECTOMY  1985   CYSTOSCOPY W/ URETERAL STENT PLACEMENT Left 10/31/2021   Procedure: CYSTOSCOPY WITH RETROGRADE PYELOGRAM/URETERAL STENT PLACEMENT;  Surgeon: Ardis Hughs, MD;  Location: [place];  Service: Urology;  Laterality: Left;   CYSTOSCOPY WITH URETEROSCOPY AND STENT PLACEMENT Left 06/18/2013   Procedure: CYSTOSCOPY WITH Lef URETEROSCOPY AND Left STENT PLACEMENT;  Surgeon: Dutch Gray, MD;  Location: [facility];  Service: Urology;  Laterality: Left;  ESOPHAGOGASTRODUODENOSCOPY (EGD) WITH PROPOFOL N/A 11/13/2016   Procedure: ESOPHAGOGASTRODUODENOSCOPY (EGD) WITH PROPOFOL;  Surgeon: Otis Brace, MD;  Location: [place];  Service: Gastroenterology;  Laterality: N/A;   EUS N/A 02/07/2016   Procedure: ESOPHAGEAL ENDOSCOPIC ULTRASOUND  (EUS) RADIAL;  Surgeon: Arta Silence, MD;  [facility];  Service: Endoscopy;  Laterality: N/A;   EYE SURGERY Bilateral 2014   ioc for catracts    FOOT SURGERY Left 1990   something with toes unsure what    HERNIA REPAIR  2018   IR CATHETER TUBE CHANGE  03/14/2022   IR EMBO TUMOR ORGAN ISCHEMIA INFARCT INC GUIDE ROADMAPPING  08/22/2021   IR NEPHROSTOMY PLACEMENT LEFT  03/14/2022   IR RADIOLOGIST EVAL & MGMT  05/18/2021   IR RADIOLOGIST EVAL & MGMT  10/01/2021   IR RADIOLOGIST EVAL & MGMT  11/12/2021   IR RADIOLOGIST EVAL & MGMT  11/22/2021   IR RADIOLOGIST EVAL & MGMT  11/27/2021   IR RADIOLOGIST EVAL & MGMT  12/20/2021   IR RENAL SUPRASEL UNI S&I MOD SED  08/22/2021   IR US GUIDE VASC ACCESS RIGHT  08/22/2021   LEFT HEART CATH AND CORONARY ANGIOGRAPHY N/A 08/27/2021   Procedure: LEFT HEART CATH AND CORONARY ANGIOGRAPHY;  Surgeon: Burnell Blanks, MD;  Location: [place];  Service: Cardiovascular;  Laterality: N/A;   PARATHYROID EXPLORATION     PERIPHERAL VASCULAR BALLOON ANGIOPLASTY Left 08/30/2021   Procedure: PERIPHERAL VASCULAR BALLOON ANGIOPLASTY;  Surgeon: Marty Heck, MD;  Location: [place] [facility];  Service: Cardiovascular;  Laterality: Left;  arm fistula   RADIOLOGY WITH ANESTHESIA Left 08/22/2021   Procedure: MICROWAVE ABLATION;  Surgeon: Sandi Mariscal, MD;  [facility];  Service: Anesthesiology;  Laterality: Left;   WHIPPLE PROCEDURE N/A 04/23/2016   Procedure: WHIPPLE PROCEDURE;  Surgeon: Stark Klein, MD;  Location: [place];  Service: General;  Laterality: N/A;    Allergies: Lopid [gemfibrozil], Lotemax [loteprednol etabonate], Statins, Norco [hydrocodone-acetaminophen], Other, Tomato, Vibra-tab [doxycycline], Amlodipine, Codeine, Hydrocodone, Hydrocodone-acetaminophen, Lisinopril, and Verapamil  Medications: Prior to Admission medications   Medication Sig Start Date End Date Taking? Authorizing Provider  acetaminophen (TYLENOL) 325 MG tablet  Take 2 tablets (650 mg total) by mouth every 6 (six) hours as needed for mild pain, fever or headache. 01/30/22   Elgergawy, Silver Huguenin, MD  amiodarone (PACERONE) 200 MG tablet Take 0.5 tablets (100 mg total) by mouth daily. Please keep appointment for further refills 11/23/21   Martinique, Peter M, MD  carvedilol (COREG) 3.125 MG tablet Take 1 tablet (3.125 mg total) by mouth 2 (two) times daily with a meal. Take on the days you do not have dialysis Patient taking differently: Take 3.125 mg by mouth 2 (two) times daily with a meal. Take on the days you do not have dialysis( Sundays, Tuesdays, Thursdays, and Saturdays) 11/04/21   Mercy Riding, MD  Cholecalciferol (VITAMIN D-3 PO) Take 1 capsule by mouth every Monday, Wednesday, and Friday with hemodialysis.    [provider]  colestipol (COLESTID) 1 g tablet Take 1 g by mouth See admin instructions. 1 tablet (1g) prior to dialysis M-W-F + 1 tablet daily PRN diarrhea    [provider]  diclofenac sodium (VOLTAREN) 1 % GEL Apply 2 g topically 4 (four) times daily as needed (for hand pain).    [provider]  ferrous sulfate 325 (65 FE) MG tablet Take 1 tablet (325 mg total) by mouth daily with breakfast. 01/30/22   Elgergawy, Silver Huguenin,  MD  folic acid (FOLVITE) 1 MG tablet Take 1 tablet (1 mg total) by mouth daily. 01/31/22   Elgergawy, Silver Huguenin, MD  hydrALAZINE (APRESOLINE) 50 MG tablet Take 1 tablet (50 mg total) by mouth 3 (three) times daily. Take on non dialysis days 02/25/22   Martin[place], Peter M, MD  insulin aspart (NOVOLOG) 100 UNIT/ML injection Inject 0-6 Units into the skin 3 (three) times daily with meals. 01/30/22   Elgergawy, Silver Huguenin, MD  lidocaine-prilocaine (EMLA) cream Apply 1 Application topically every Monday, Wednesday, and Friday with hemodialysis.    [provider]  lipase/protease/amylase (CREON) 36000 UNITS CPEP capsule Take 36,000 Units by mouth 2 (two) times daily with a meal.    [provider]   olopatadine (PATADAY) 0.1 % ophthalmic solution Place 1 drop into both eyes 2 (two) times daily.    [provider]  pantoprazole (PROTONIX) 40 MG tablet Take 1 tablet (40 mg total) by mouth daily. 01/31/22   Elgergawy, Silver Huguenin, MD  RESTASIS 0.05 % ophthalmic emulsion 1 drop 2 (two) times daily. 11/06/21   [provider]  rOPINIRole (REQUIP) 0.25 MG tablet Take 0.25 mg by mouth at bedtime. 12/11/21   [provider]  senna (SENOKOT) 8.6 MG TABS tablet Take 1 tablet (8.6 mg total) by mouth at bedtime. 01/30/22   Elgergawy, Silver Huguenin, MD  sevelamer carbonate (RENVELA) 800 MG tablet Take 1,600 mg by mouth 3 (three) times daily with meals.    [provider]  sodium chloride flush (NS) 0.9 % SOLN 5 mLs by Intracatheter route every 8 (eight) hours. 11/04/21   Mercy Riding, MD  traMADol (ULTRAM) 50 MG tablet Take 1 tablet (50 mg total) by mouth every 12 (twelve) hours as needed for moderate pain or severe pain. 01/30/22   Elgergawy, Silver Huguenin, MD  XIIDRA 5 % SOLN Place 1 drop into both eyes at bedtime. 09/24/21   [provider]     Family History  Problem Relation Age of Onset   Heart disease Mother    Stroke Mother    Cancer Father        colon   Alzheimer's disease Sister    Alzheimer's disease Sister    Cancer Brother        brain   Breast cancer Neg Hx     Social History   Socioeconomic History   Marital status: Widowed    Spouse name: Not on file   Number of children: Not on file   Years of education: Not on file   Highest education level: Not on file  Occupational History   Not on file  Tobacco Use   Smoking status: Never   Smokeless tobacco: Never  Vaping Use   Vaping Use: Never used  Substance and Sexual Activity   Alcohol use: No   Drug use: No   Sexual activity: Not on file  Other Topics Concern   Not on file  Social History Narrative   Not on file   Social Determinants of Health   Financial Resource Strain: Not on  file  Food Insecurity: No Food Insecurity (11/01/2021)   Hunger Vital Sign    Worried About Running Out of Food in the Last Year: Never true    Ran Out of Food in the Last Year: Never true  Transportation Needs: No Transportation Needs (11/01/2021)   PRAPARE - Hydrologist (Medical): No    Lack of Transportation (Non-Medical): No  Physical  Activity: Not on file  Stress: Not on file  Social Connections: Not on file    ECOG Status: 2 - Symptomatic, <50% confined to bed  Review of Systems: A 12 point ROS discussed and pertinent positives are indicated in the HPI above.  All other systems are negative.  Review of Systems  Vital Signs: BP 137/74 (BP Location: Right Arm)   Pulse 98   Temp 98.1 F (36.7 C)   SpO2 99%   Physical Exam  Imaging: CT ABDOMEN PELVIS W CONTRAST  Result Date: 03/22/2022 CLINICAL DATA:  History of left-sided renal cell carcinoma, post endovascular embolization and microwave ablation on 08/22/2021. Patient developed development of a delayed urinary leak, ultimately requiring placement of a left-sided double-J ureteral stent on 10/21/2021 and ultimately a left-sided perinephric drainage catheter on 11/02/2021. Patient was subsequently admitted with development of a left-sided subcapsular renal hematoma and ultimately, despite prolonged conservative management, the patient's urinary leak has persisted. Patient underwent fluoroscopic guided exchange of the left-sided perinephric drainage catheter, placement of a left-sided percutaneous nephrostomy catheter as well as attempted placement of a nephroureteral catheter and retrieval of double-J ureteral stent on 03/14/2022. She returns to the interventional radiology drain clinic for drainage catheter evaluation and management. Patient reports persistent large volume output (of approximally 100 to 200 cc per day) from the perinephric drainage catheter with small volume output (25-50 cc per day) from  the percutaneous nephrostomy catheter. EXAM: CT ABDOMEN AND PELVIS WITH CONTRAST TECHNIQUE: Multidetector CT imaging of the abdomen and pelvis was performed using the standard protocol following bolus administration of intravenous contrast. RADIATION DOSE REDUCTION: This exam was performed according to the departmental dose-optimization program which includes automated exposure control, adjustment of the mA and/or kV according to patient size and/or use of iterative reconstruction technique. CONTRAST:  157m ISOVUE-300 IOPAMIDOL (ISOVUE-300) INJECTION 61% COMPARISON:  Fluoroscopic guided nephrostomy catheter placement and drainage catheter exchange-03/14/2022 CT abdomen pelvis-01/23/2022; 01/22/2022; 05/03/2021 FINDINGS: Lower chest: Limited visualization of the lower thorax resolution of previously noted trace left-sided pleural effusion. No focal airspace opacities. Cardiomegaly. Coronary artery calcifications. No pericardial effusion. Hepatobiliary: Normal hepatic contour. No discrete hepatic lesions. Post cholecystectomy. No ascites. Pancreas: Stable postoperative change of the pancreas compatible with known history of Whipple. Spleen: Punctate subcentimeter hypoattenuating splenic lesions, potentially cysts versus hemangiomas, unchanged. Adrenals/Urinary Tract: Redemonstrated atrophy of the bilateral kidneys with innumerable hypoattenuating nonenhancing renal cysts. Previously embolized and ablated partially exophytic mass arising from the inferior pole of the left kidney has significantly decreased in size compared to preprocedural CT scan performed 05/03/2021, presently measuring 2.8 x 2.3 cm (image 75, series 6), previously, 3.7 x 3.3 cm. Evaluation for residual enhancement is degraded secondary to acquisition of solitary portal venous phase. Interval involution previously noted large left-sided perinephric hematoma with residual organized hematoma measuring a proximally 11.8 x 8.8 x 4.8 cm (coronal image  90, series 6; axial image 22, series 22), previously, 19 x 9 x 5 cm. Interval retraction/slight repositioning of right-sided perinephric drainage catheter with stable positioning of new percutaneous nephrostomy catheter with end coiled and locked within the accessed renal calyx. Superior aspect of double-J ureteral stent terminates within mid pole calyx while inferior coil is within the urinary bladder. There is residual moderate-to-marked pelvicaliectasis involving superior collecting system of the left kidney which was not opacified at the time of left-sided nephrostomy catheter placement performed 03/14/2022. No evidence of right-sided urinary obstruction. Redemonstrated bilateral perinephric stranding. Normal appearance of the bilateral adrenal glands. The urinary bladder is underdistended. Stomach/Bowel:  Moderate colonic stool burden without evidence of enteric obstruction. Stable sequela of previous Whipple procedure without evidence of enteric obstruction. No significant hiatal hernia. No pneumoperitoneum, pneumatosis or portal venous gas. Vascular/Lymphatic: Moderate-to-large amount of atherosclerotic plaque within a tortuous but normal caliber abdominal aorta. The major branch vessels of the abdominal aorta appear patent on this non CTA examination. No bulky retroperitoneal, mesenteric, pelvic or inguinal lymph adenopathy. Reproductive: Post hysterectomy. No discrete adnexal lesions. Multiple phleboliths are seen with the lower pelvis bilaterally. Note is made of right-sided gonadal vein phleboliths. Other: Mild diffuse body wall anasarca. Tiny mesenteric fat containing midline ventral abdominal wall hernias, unchanged. Musculoskeletal: No acute or aggressive osseous abnormalities. Moderate to severe multilevel lumbar spine DDD, worse at L2-L3 and L3-L4 with disc space height loss, endplate irregularity and small posteriorly directed disc osteophyte complexes at these locations. Moderate to severe  degenerative change of the bilateral hips, right-greater-than-left. IMPRESSION: 1. Stable positioning of left-sided perinephric and nephrostomy drainage catheters without new perinephric fluid collection. Residual moderate to marked pelvicaliectasis involving the superior pole of the left kidney which was not opacified at the time of the fluoroscopic guided left-sided nephrostomy catheter placement performed 03/14/2022. 2. Interval involution and organization of left-sided perinephric hematoma with residual component measuring 1.8 cm in greatest diameter, previously, 19 cm. 3. Stable positioning of left-sided double-J ureteral stent. 4. Improved aeration of the lung bases with resolution of previously noted trace left-sided pleural effusion. 5. Stable sequela of previous Whipple procedure without evidence of enteric obstruction. 6.  Aortic Atherosclerosis (ICD10-I70.0). Electronically Signed   By: Sandi Mariscal M.D.   On: 03/22/2022 15:46   IR NEPHROSTOMY PLACEMENT LEFT  Result Date: 03/15/2022 INDICATION: History of left-sided renal cell carcinoma, post endovascular embolization and microwave ablation on 08/22/2021. Patient developed a delayed urinary leak requiring left-sided ureteral stent placement on 10/21/2021 and ultimately placement of a perinephric drainage catheter on 11/02/2021. Patient is perinephric drainage catheter has been conservatively managed since that time however her urine leak persists, reporting approximately 200 cc of output per day Patient presents today for drainage catheter injection with attempted percutaneous cannulation of the urine leak and or placement of a nephrostomy catheter with potential perinephric drainage catheter exchange and/or removal and attempted percutaneous ureteral stent removal. EXAM: 1. FLUOROSCOPIC GUIDED PERINEPHRIC DRAINAGE CATHETER INJECTION AND EXCHANGE 2. FLUOROSCOPIC GUIDED LEFT-SIDED NEPHROSTOMY CATHETER PLACEMENT 3. ATTEMPTED FLUOROSCOPIC GUIDED URETERAL  STENT REMOVAL. COMPARISON:  Multiple previous fluoroscopic guided perinephric drainage catheter injections, most recently on 02/21/2022 CT abdomen and pelvis-01/23/2022; 01/22/2022 MEDICATIONS: Rocephin 2 gm IV; The antibiotic was administered in an appropriate time frame prior to skin puncture. ANESTHESIA/SEDATION: Moderate (conscious) sedation was employed during this procedure. A total of Versed 1 mg and Fentanyl 75 mcg was administered intravenously. Moderate Sedation Time: 95 minutes. The patient's level of consciousness and vital signs were monitored continuously by radiology nursing throughout the procedure under my direct supervision. CONTRAST:  60 cc Omnipaque 300-administered into the renal collecting system FLUOROSCOPY TIME:  44 minutes, 42 seconds (878 mGy) COMPLICATIONS: None immediate. PROCEDURE: The procedure, risks, benefits, and alternatives were explained to the patient, questions were encouraged and answered and informed consent was obtained. A timeout was performed prior to the initiation of the procedure. The external portion of the existing left-sided perinephric drainage catheter as well as the surrounding skin was prepped and draped in usual sterile fashion Preprocedural spot fluoroscopic image was obtained of the right upper abdominal quadrant Multiple spot fluoroscopic images were obtained following the administration of contrast  via the perinephric drainage catheter. The external portion of the perinephric drainage catheter was cut and cannulated with a short Amplatz wire. Under intermittent fluoroscopic guidance, the perinephric drainage catheter was exchanged for a Kumpe catheter which was utilized to attempt to advanced a regular glidewire through the previously identified urine leak involving the inferior pole calyx. Ultimately, it became apparent that the residual leak was associated with the superior aspect of the left ureter with transient faint opacification of the left renal  collecting system. Next, under fluoroscopic guidance, the drainage of the opacified left renal collecting system was accessed with a McCall needle. A Nitrex wire was advanced to the level of the left renal pelvis. Next, under fluoroscopic guidance, the Chiba needle was exchanged for the inner 3 French catheter of the Accustick set which allowed for further opacification of the left renal collecting system. Next, under fluoroscopic guidance, the previously injured inferior pole calyx was targeted with a 22 gauge needle. A Nitrex wire was advanced into the left renal collecting system and coiled within the superior pole calyx. Under intermittent fluoroscopic guidance, the Chiba needle was exchanged for a Accustick set with contrast injection confirming appropriate positioning. Next, a 4 French angled glide catheter was inserted through the outer sheath of the Accustick set and utilized to attempt to advanced a Glidewire to the superior aspect of the left ureter however this ultimately proved unsuccessful As such, over a short Amplatz wire, the Accustick catheter was exchanged for an 8 [place] vascular sheath. Next, a rim catheter was utilized to attempt to advanced a stiff glide wire to the superior aspect of the left ureter however this ultimately proved unsuccessful Next, a 2 loop snare was utilized to attempt to grasp the superior aspect of the left-sided ureteral stent however this ultimately proved unsuccessful. Finally, the 8 [place] vascular sheath was exchanged for an 8 [employer] catheter with end ultimately coiled and locked within the left renal pelvis. (Note, initially a 10 [place] all-purpose drainage catheter was attempted to be placed within the left renal pelvis however could not forearm secondary to the small size of the left renal collecting system). Finally, the 10 French perinephric drainage catheter was repositioned within the residual perinephric collection. Both drainage  catheters were secured at the skin entrance surface site within interrupted sutures. Both drainage catheters were connected to gravity bags. Dressings were applied. The patient tolerated procedure well without immediate postprocedural complication. FINDINGS: Preprocedural spot fluoroscopic image demonstrates partial retraction of the left-sided perinephric drainage catheter Contrast injection demonstrates faint opacification of the left renal collecting system, not definitively via the previously identified injured inferior pole calyx but rather from the superior aspect of the left ureter. Attempts to recannulate the injured inferior pole calyx proved unsuccessful however utilizing a 2 stick technique as detailed above, access was achieved to the previously injured inferior pole calyx. Attempts were made to traverse the superior aspect of the left ureter however this ultimately proved unsuccessful. Contrast injection confirmed leakage at the superior aspect of the left ureter. Attempted though ultimately unsuccessful percutaneous retrieval of left-sided double-J ureteral stent secondary to positioning of the superior aspect of the ureteral stent within the superior aspect of the left renal collecting system and diminutive left renal pelvis. Ultimately, an 8 French Bettey Mare catheter was positioned with end coiled and locked within the left renal pelvis and 10 [place] all-purpose drainage catheter coiled within residual perinephric urinoma. IMPRESSION: 1. Persistent urine leak attributable to injury involving the superior  aspect of the left ureter. 2. Technically successful placement of a 8 French Bettey Mare via previously injured left inferior pole renal calyx with end coiled and locked within the left renal pelvis. 3. Attempted though unsuccessful traversal of injured superior aspect of the left ureter. 4. Attempted though ultimately unsuccessful percutaneous retrieval of left-sided double-J ureteral stent.  5. Successful fluoroscopic guided exchange of 10 French perinephric urinoma drainage catheter. PLAN: Patient was instructed to maintain both drainage catheters to gravity bags. She was instructed not to flush either drainage catheter except for problem solving purposes such as if there is concern for catheter occlusion. Patient will be seen in interventional radiology drain clinic this coming Friday, 2/2, for CT of the abdomen pelvis, drainage catheter injection, evaluation and management. Above was discussed at length with patient's urologist, Dr. Louis Meckel. Ultimately, if above interventions prove unsuccessful, the patient might require renal embolization. Electronically Signed   By: Sandi Mariscal M.D.   On: 03/15/2022 09:51   IR Catheter Tube Change  Result Date: 03/15/2022 INDICATION: History of left-sided renal cell carcinoma, post endovascular embolization and microwave ablation on 08/22/2021. Patient developed a delayed urinary leak requiring left-sided ureteral stent placement on 10/21/2021 and ultimately placement of a perinephric drainage catheter on 11/02/2021. Patient is perinephric drainage catheter has been conservatively managed since that time however her urine leak persists, reporting approximately 200 cc of output per day Patient presents today for drainage catheter injection with attempted percutaneous cannulation of the urine leak and or placement of a nephrostomy catheter with potential perinephric drainage catheter exchange and/or removal and attempted percutaneous ureteral stent removal. EXAM: 1. FLUOROSCOPIC GUIDED PERINEPHRIC DRAINAGE CATHETER INJECTION AND EXCHANGE 2. FLUOROSCOPIC GUIDED LEFT-SIDED NEPHROSTOMY CATHETER PLACEMENT 3. ATTEMPTED FLUOROSCOPIC GUIDED URETERAL STENT REMOVAL. COMPARISON:  Multiple previous fluoroscopic guided perinephric drainage catheter injections, most recently on 02/21/2022 CT abdomen and pelvis-01/23/2022; 01/22/2022 MEDICATIONS: Rocephin 2 gm IV; The  antibiotic was administered in an appropriate time frame prior to skin puncture. ANESTHESIA/SEDATION: Moderate (conscious) sedation was employed during this procedure. A total of Versed 1 mg and Fentanyl 75 mcg was administered intravenously. Moderate Sedation Time: 95 minutes. The patient's level of consciousness and vital signs were monitored continuously by radiology nursing throughout the procedure under my direct supervision. CONTRAST:  60 cc Omnipaque 300-administered into the renal collecting system FLUOROSCOPY TIME:  44 minutes, 42 seconds (893 mGy) COMPLICATIONS: None immediate. PROCEDURE: The procedure, risks, benefits, and alternatives were explained to the patient, questions were encouraged and answered and informed consent was obtained. A timeout was performed prior to the initiation of the procedure. The external portion of the existing left-sided perinephric drainage catheter as well as the surrounding skin was prepped and draped in usual sterile fashion Preprocedural spot fluoroscopic image was obtained of the right upper abdominal quadrant Multiple spot fluoroscopic images were obtained following the administration of contrast via the perinephric drainage catheter. The external portion of the perinephric drainage catheter was cut and cannulated with a short Amplatz wire. Under intermittent fluoroscopic guidance, the perinephric drainage catheter was exchanged for a Kum[place] catheter which was utilized to attempt to advanced a regular glidewire through the previously identified urine leak involving the inferior pole calyx. Ultimately, it became apparent that the residual leak was associated with the superior aspect of the left ureter with transient faint opacification of the left renal collecting system. Next, under fluoroscopic guidance, the drainage of the opacified left renal collecting system was accessed with a [place] needle. A Nitrex wire was advanced  to the level of the left renal pelvis.  Next, under fluoroscopic guidance, the Chiba needle was exchanged for the inner 3 French catheter of the [place] set which allowed for further opacification of the left renal collecting system. Next, under fluoroscopic guidance, the previously injured inferior pole calyx was targeted with a 22 gauge needle. A Nitrex wire was advanced into the left renal collecting system and coiled within the superior pole calyx. Under intermittent fluoroscopic guidance, the Chiba needle was exchanged for a [place] set with contrast injection confirming appropriate positioning. Next, a 4 French angled glide catheter was inserted through the outer sheath of the Accustick set and utilized to attempt to advanced a Glidewire to the superior aspect of the left ureter however this ultimately proved unsuccessful As such, over a short Amplatz wire, the Acc[place]ick catheter was exchanged for an 8 [place] vascular sheath. Next, a rim catheter was utilized to attempt to advanced a stiff glide wire to the superior aspect of the left ureter however this ultimately proved unsuccessful Next, a 2 loop snare was utilized to attempt to grasp the superior aspect of the left-sided ureteral stent however this ultimately proved unsuccessful. Finally, the 8 [place] vascular sheath was exchanged for an 8 [employer] catheter with end ultimately coiled and locked within the left renal pelvis. (Note, initially a 10 [place] all-purpose drainage catheter was attempted to be placed within the left renal pelvis however could not forearm secondary to the small size of the left renal collecting system). Finally, the 10 French perinephric drainage catheter was repositioned within the residual perinephric collection. Both drainage catheters were secured at the skin entrance surface site within interrupted sutures. Both drainage catheters were connected to gravity bags. Dressings were applied. The patient tolerated procedure well without immediate  postprocedural complication. FINDINGS: Preprocedural spot fluoroscopic image demonstrates partial retraction of the left-sided perinephric drainage catheter Contrast injection demonstrates faint opacification of the left renal collecting system, not definitively via the previously identified injured inferior pole calyx but rather from the superior aspect of the left ureter. Attempts to recannulate the injured inferior pole calyx proved unsuccessful however utilizing a 2 stick technique as detailed above, access was achieved to the previously injured inferior pole calyx. Attempts were made to traverse the superior aspect of the left ureter however this ultimately proved unsuccessful. Contrast injection confirmed leakage at the superior aspect of the left ureter. Attempted though ultimately unsuccessful percutaneous retrieval of left-sided double-J ureteral stent secondary to positioning of the superior aspect of the ureteral stent within the superior aspect of the left renal collecting system and diminutive left renal pelvis. Ultimately, an 8 French Bettey Mare catheter was positioned with end coiled and locked within the left renal pelvis and 10 [place] all-purpose drainage catheter coiled within residual perinephric urinoma. IMPRESSION: 1. Persistent urine leak attributable to injury involving the superior aspect of the left ureter. 2. Technically successful placement of a 8 French Bettey Mare via previously injured left inferior pole renal calyx with end coiled and locked within the left renal pelvis. 3. Attempted though unsuccessful traversal of injured superior aspect of the left ureter. 4. Attempted though ultimately unsuccessful percutaneous retrieval of left-sided double-J ureteral stent. 5. Successful fluoroscopic guided exchange of 10 French perinephric urinoma drainage catheter. PLAN: Patient was instructed to maintain both drainage catheters to gravity bags. She was instructed not to flush either  drainage catheter except for problem solving purposes such as if there is concern for catheter occlusion. Patient will be seen in  interventional radiology drain clinic this coming Friday, 2/2, for CT of the abdomen pelvis, drainage catheter injection, evaluation and management. Above was discussed at length with patient's urologist, Dr. Louis Meckel. Ultimately, if above interventions prove unsuccessful, the patient might require renal embolization. Electronically Signed   By: Sandi Mariscal M.D.   On: 03/15/2022 09:51   DG Sinus/Fist Tube Chk-Non GI  Result Date: 02/21/2022 CLINICAL DATA:  87 year old female with history of left-sided renal cell carcinoma, post endovascular embolization and microwave ablation on 08/22/2021. Patient developed a delayed urinary leak requiring left ureteral stent placement on 10/21/2021 and ultimately placement of a left-sided perinephric drainage catheter on 11/02/2021. Unfortunately, subsequent drainage catheter injections demonstrate a persistent urine leak with communication between the drainage catheter and the left renal collecting system. Patient was recently admitted to the hospital with development of a left-sided subcapsular hematoma felt to be at least partially attributable to patient's anticoagulated state in the setting of end-stage renal disease, on dialysis. The patient presents today to the interventional radiology drain clinic for drainage catheter evaluation and management. She reports persistent output from the drainage catheter of approximately 100 cc of urine per day. EXAM: ABSCESS INJECTION COMPARISON:  CT abdomen pelvis-01/23/2022 Multiple previous drainage catheter injections, most recently on 12/20/2021 CONTRAST:  10 cc Omnipaque 300 - administered via the existing percutaneous drain. FLUOROSCOPY: 16 seconds (1.6 mGy) TECHNIQUE: The patient was positioned prone on the fluoroscopy table. A preprocedural spot fluoroscopic image was obtained of the left flank and  existing perinephric drainage catheter and the existing percutaneous drainage catheter. Multiple spot fluoroscopic and radiographic images were obtained following the injection of a small amount of contrast via the existing percutaneous drainage catheter. Images were reviewed and discussed with the patient and the patient's daughter. The drainage catheter was flushed with a small amount of saline and reconnected to a gravity bag. A dressing was applied. The patient tolerated the procedure well without immediate postprocedural complication. FINDINGS: Preprocedural spot fluoroscopic image demonstrates stable positioning of left-sided perinephric drainage catheter with end coiled and locked overlying the expected location of the inferior pole of the left kidney. The superior end of the left-sided ureteral stent overlies expected location of the superior aspect of the left renal collecting system. Embolization coils overlie the mid/inferior pole of the left kidney. Drainage catheter injection demonstrates brisk communication between the decompressed perinephric urinoma and the left inferior pole renal collecting system. IMPRESSION: Drain injection demonstrates a persistent leak with communication between the decompressed urinoma and the left renal collecting system. Electronically Signed   By: Sandi Mariscal M.D.   On: 02/21/2022 17:18    Labs:  CBC: Recent Labs    01/26/22 0230 01/28/22 0318 01/30/22 0501 03/14/22 1027  WBC 8.9 9.1 10.4 6.9  HGB 8.3* 9.1* 9.8* 11.0*  HCT 25.0* 28.1* 29.7* 35.1*  PLT 241 345 431* 218    COAGS: Recent Labs    10/29/21 1709 11/01/21 0630 01/22/22 0038 03/14/22 1027  INR 1.4* 1.2 1.4* 1.0  APTT  --   --  26  --     BMP: Recent Labs    01/26/22 0230 01/28/22 0318 01/30/22 1342 03/14/22 1027  NA 134* 131* 131* 141  K 3.6 3.8 4.0 4.0  CL 95* 93* 93* 100  CO2 '26 26 26 29  '$ GLUCOSE 142* 141* 160* 120*  BUN 25* 21 19 30*  CALCIUM 8.4* 8.6* 8.5* 8.2*   CREATININE 4.42* 4.42* 4.55* 4.17*  GFRNONAA 9* 9* 9* 10*    LIVER  FUNCTION TESTS: Recent Labs    10/30/21 0226 10/31/21 0748 01/21/22 2305 01/22/22 0506 01/28/22 0318 01/30/22 1342 02/25/22 1426  BILITOT 0.7  --  0.4 0.4  --   --  0.3  AST 15  --  37 28  --   --  30  ALT 16  --  52* 47*  --   --  36*  ALKPHOS 62  --  55 45  --   --  110  PROT 6.3*  --  5.6* 5.2*  --   --  6.1  ALBUMIN 2.2*   < > 2.4* 2.3* 2.2* 2.3* 3.3*   < > = values in this interval not displayed.    TUMOR MARKERS: No results for input(s): "AFPTM", "CEA", "CA199", "CHROMGRNA" in the last 8760 hours.  Assessment and Plan:  Kim Burns is a 87 y.o. female [place] significant for anemia of chronic disease, bradycardia, diabetes mellitus, ESRD on dialysis, left-sided renal cell carcinoma post left-sided renal cell carcinoma, post endovascular embolization and microwave ablation on 08/22/2021, who present today to drain clinic for drainage catheter evaluation and management.  The patient reports persistent large volume output (of approximally 100 to 200 cc per day) from the perinephric drainage catheter with small volume output (25-50 cc per day) from the percutaneous nephrostomy catheter.   Preceding CT scan of the abdomen and pelvis demonstrates stable positioning of left-sided perinephric and nephrostomy drainage catheters without new perinephric fluid collection.  Residual moderate to marked pelvicaliectasis involving the superior pole of the left kidney which was not opacified at the time of the fluoroscopic guided left-sided nephrostomy catheter placement performed 03/14/2022.   Contrast injection demonstrates appropriate positioning of the left-sided nephrostomy catheter with persistent findings of a urine leak at the level of the superior aspect of the left ureter with communication to the perinephric drainage catheter.  Prolonged conversations were held with the patient the patient's son regarding the  complexity of the patient's continued urine leak.  They are both very understanding of the current situation.    Presently, the patient would like to ONLY pursue embolization if there are no additional available treatment options and is open to the idea of maintaining (ideally) one drainage catheter which she understands would require exchanges every 8-10 weeks.   As such, the patient's nephrostomy catheter will be maintained to a gravity bag while the perinephric drainage catheter will be capped for a trial of internalization.   The hope is that capping of the perinephric drainage catheter we will allow for preferential drainage via the nephrostomy catheter in hopes of ultimately being able to remove the perinephric drainage catheter and solely utilizing the more stable nephrostomy catheter.   PLAN:  - Patient will return to IR drain clinic on 2/15 for drain injection, evaluation and management (no CT).  A copy of this report was sent to the requesting provider on this date.  Electronically Signed: Sandi Mariscal 03/22/2022, 4:11 PM   I spent a total of 15 Minutes in face to face in clinical consultation, greater than 50% of which was counseling/coordinating care for drainage catheter evaluation and management.

## 2022-03-25 ENCOUNTER — Other Ambulatory Visit: Payer: Self-pay | Admitting: Interventional Radiology

## 2022-03-25 DIAGNOSIS — Z992 Dependence on renal dialysis: Secondary | ICD-10-CM | POA: Diagnosis not present

## 2022-03-25 DIAGNOSIS — N2581 Secondary hyperparathyroidism of renal origin: Secondary | ICD-10-CM | POA: Diagnosis not present

## 2022-03-25 DIAGNOSIS — N2889 Other specified disorders of kidney and ureter: Secondary | ICD-10-CM

## 2022-03-25 DIAGNOSIS — L299 Pruritus, unspecified: Secondary | ICD-10-CM | POA: Diagnosis not present

## 2022-03-25 DIAGNOSIS — N186 End stage renal disease: Secondary | ICD-10-CM | POA: Diagnosis not present

## 2022-03-26 DIAGNOSIS — Z79899 Other long term (current) drug therapy: Secondary | ICD-10-CM | POA: Diagnosis not present

## 2022-03-26 DIAGNOSIS — N186 End stage renal disease: Secondary | ICD-10-CM | POA: Diagnosis not present

## 2022-03-26 DIAGNOSIS — S37019A Minor contusion of unspecified kidney, initial encounter: Secondary | ICD-10-CM | POA: Diagnosis not present

## 2022-03-26 DIAGNOSIS — I10 Essential (primary) hypertension: Secondary | ICD-10-CM | POA: Diagnosis not present

## 2022-03-26 DIAGNOSIS — K219 Gastro-esophageal reflux disease without esophagitis: Secondary | ICD-10-CM | POA: Diagnosis not present

## 2022-03-26 DIAGNOSIS — E109 Type 1 diabetes mellitus without complications: Secondary | ICD-10-CM | POA: Diagnosis not present

## 2022-03-26 DIAGNOSIS — G4700 Insomnia, unspecified: Secondary | ICD-10-CM | POA: Diagnosis not present

## 2022-03-26 DIAGNOSIS — I4891 Unspecified atrial fibrillation: Secondary | ICD-10-CM | POA: Diagnosis not present

## 2022-03-26 DIAGNOSIS — D509 Iron deficiency anemia, unspecified: Secondary | ICD-10-CM | POA: Diagnosis not present

## 2022-03-27 DIAGNOSIS — N2581 Secondary hyperparathyroidism of renal origin: Secondary | ICD-10-CM | POA: Diagnosis not present

## 2022-03-27 DIAGNOSIS — Z992 Dependence on renal dialysis: Secondary | ICD-10-CM | POA: Diagnosis not present

## 2022-03-27 DIAGNOSIS — L299 Pruritus, unspecified: Secondary | ICD-10-CM | POA: Diagnosis not present

## 2022-03-27 DIAGNOSIS — N186 End stage renal disease: Secondary | ICD-10-CM | POA: Diagnosis not present

## 2022-03-28 DIAGNOSIS — R42 Dizziness and giddiness: Secondary | ICD-10-CM | POA: Diagnosis not present

## 2022-03-28 DIAGNOSIS — E109 Type 1 diabetes mellitus without complications: Secondary | ICD-10-CM | POA: Diagnosis not present

## 2022-03-28 DIAGNOSIS — Z0000 Encounter for general adult medical examination without abnormal findings: Secondary | ICD-10-CM | POA: Diagnosis not present

## 2022-03-28 DIAGNOSIS — E559 Vitamin D deficiency, unspecified: Secondary | ICD-10-CM | POA: Diagnosis not present

## 2022-03-28 DIAGNOSIS — G4700 Insomnia, unspecified: Secondary | ICD-10-CM | POA: Diagnosis not present

## 2022-03-28 DIAGNOSIS — Z1331 Encounter for screening for depression: Secondary | ICD-10-CM | POA: Diagnosis not present

## 2022-03-28 DIAGNOSIS — Z1339 Encounter for screening examination for other mental health and behavioral disorders: Secondary | ICD-10-CM | POA: Diagnosis not present

## 2022-03-28 DIAGNOSIS — R5383 Other fatigue: Secondary | ICD-10-CM | POA: Diagnosis not present

## 2022-03-29 DIAGNOSIS — N186 End stage renal disease: Secondary | ICD-10-CM | POA: Diagnosis not present

## 2022-03-29 DIAGNOSIS — N2581 Secondary hyperparathyroidism of renal origin: Secondary | ICD-10-CM | POA: Diagnosis not present

## 2022-03-29 DIAGNOSIS — Z992 Dependence on renal dialysis: Secondary | ICD-10-CM | POA: Diagnosis not present

## 2022-03-29 DIAGNOSIS — L299 Pruritus, unspecified: Secondary | ICD-10-CM | POA: Diagnosis not present

## 2022-04-01 DIAGNOSIS — L299 Pruritus, unspecified: Secondary | ICD-10-CM | POA: Diagnosis not present

## 2022-04-01 DIAGNOSIS — N2581 Secondary hyperparathyroidism of renal origin: Secondary | ICD-10-CM | POA: Diagnosis not present

## 2022-04-01 DIAGNOSIS — Z992 Dependence on renal dialysis: Secondary | ICD-10-CM | POA: Diagnosis not present

## 2022-04-01 DIAGNOSIS — N186 End stage renal disease: Secondary | ICD-10-CM | POA: Diagnosis not present

## 2022-04-03 DIAGNOSIS — L299 Pruritus, unspecified: Secondary | ICD-10-CM | POA: Diagnosis not present

## 2022-04-03 DIAGNOSIS — Z992 Dependence on renal dialysis: Secondary | ICD-10-CM | POA: Diagnosis not present

## 2022-04-03 DIAGNOSIS — N186 End stage renal disease: Secondary | ICD-10-CM | POA: Diagnosis not present

## 2022-04-03 DIAGNOSIS — N2581 Secondary hyperparathyroidism of renal origin: Secondary | ICD-10-CM | POA: Diagnosis not present

## 2022-04-04 ENCOUNTER — Ambulatory Visit
Admission: RE | Admit: 2022-04-04 | Discharge: 2022-04-04 | Disposition: A | Discharge status: Home / Self Care | Payer: Medicare PPO | Source: Ambulatory Visit | Attending: Interventional Radiology | Admitting: Interventional Radiology

## 2022-04-04 DIAGNOSIS — T85638A Leakage of other specified internal prosthetic devices, implants and grafts, initial encounter: Secondary | ICD-10-CM | POA: Diagnosis not present

## 2022-04-04 DIAGNOSIS — N2889 Other specified disorders of kidney and ureter: Secondary | ICD-10-CM

## 2022-04-04 MED ORDER — IOPAMIDOL (ISOVUE-300) INJECTION 61%
10.0000 mL | Freq: Once | INTRAVENOUS | Status: AC | PRN
  Administered 2022-04-04: 10 mL

## 2022-04-04 NOTE — Progress Notes (Signed)
Patient ID: Kim Burns, female   DOB: May 24, 1935, 87 y.o.   MRN: [id number]        Chief Complaint: Urine leak   Referring Physician(s): Louis Meckel   History of Present Illness: Kim Burns is a 87 y.o. female [place] significant for anemia of chronic disease, bradycardia, diabetes mellitus, ESRD on dialysis, left-sided renal cell carcinoma post left-sided renal cell carcinoma, post endovascular embolization and microwave ablation on 08/22/2021, who present today to drain clinic for drainage catheter evaluation and management.   The patient developed development of a delayed urinary leak, ultimately requiring placement of a left-sided double-J ureteral stent on 10/21/2021 and ultimately a left-sided perinephric drainage catheter on 11/02/2021.   She was subsequently admitted with development of a left-sided subcapsular renal hematoma and ultimately, despite prolonged conservative management, the patient's urinary leak has persisted.   The patient underwent fluoroscopic guided exchange of the left-sided perinephric drainage catheter, placement of a left-sided percutaneous nephrostomy catheter as well as attempted placement of a nephroureteral catheter and retrieval of double-J ureteral stent on 03/14/2022.   The patient was seen in the interventional radiology drain clinic on 03/22/2022 at which time the perinephric drainage catheter was capped and the left-sided nephrostomy catheter was maintained to external drainage.  Since that time, the patient reports persistent output from the nephrostomy catheter though overall less volume than was previously seen in her perinephric drainage catheter.  She also reports more frequent and voluminous urination.  Patient reports a mild subjective fever earlier this week however this has subsequently resolved without intervention.  She is overall tolerated the capping trial well.    Past Medical History:  Diagnosis Date   Anemia of chronic disease     takes iron   Anxiety    Blood transfusion without reported diagnosis    Bradycardia    Diabetes mellitus without complication ([place])    became diabetic after Whipple procedure   Diarrhea    Dysrhythmia 04/16/2016   bradycardia due to medication    ESRD on hemodialysis [facility])    M-W-F   GERD (gastroesophageal reflux disease)    Gout    Headache    History of kidney stones 06/2013   Hyperparathyroidism ([place])    Hypertension    PONV (postoperative nausea and vomiting)    Sleep apnea    no cpap machine. could not tolerate   Stroke ([place]) 04/16/2016   TIA 1995   Vitamin D deficiency     Past Surgical History:  Procedure Laterality Date   A/V FISTULAGRAM Left 08/30/2021   Procedure: A/V Fistulagram;  Surgeon: Marty Heck, MD;  Location: [place] [facility];  Service: Cardiovascular;  Laterality: Left;   ABDOMINAL HYSTERECTOMY  1985   complete   BACK SURGERY  [id number]   lower   BASCILIC VEIN TRANSPOSITION Left 04/24/2017   Procedure: LEFT ARM FIRST STAGE BASILIC VEIN TRANSPOSITION;  Surgeon: Serafina Mitchell, MD;  Location: [facility];  Service: Vascular;  Laterality: Left;   [place] Left 07/10/2017   Procedure: SECOND STAGE BASILIC VEIN TRANSPOSITION LEFT ARM;  Surgeon: Serafina Mitchell, MD;  Location: [facility];  Service: Vascular;  Laterality: Left;   BREAST LUMPECTOMY Left x 2   many years apart, benign   CHOLECYSTECTOMY  1985   CYSTOSCOPY W/ URETERAL STENT PLACEMENT Left 10/31/2021   Procedure: CYSTOSCOPY WITH RETROGRADE PYELOGRAM/URETERAL STENT PLACEMENT;  Surgeon: Ardis Hughs, MD;  Location: [place];  Service: Urology;  Laterality: Left;   CYSTOSCOPY WITH  URETEROSCOPY AND STENT PLACEMENT Left 06/18/2013   Procedure: CYSTOSCOPY WITH Lef URETEROSCOPY AND Left STENT PLACEMENT;  Surgeon: Dutch Gray, MD;  Location: [facility];  Service: Urology;  Laterality: Left;   ESOPHAGOGASTRODUODENOSCOPY (EGD) WITH PROPOFOL N/A 11/13/2016   Procedure:  ESOPHAGOGASTRODUODENOSCOPY (EGD) WITH PROPOFOL;  Surgeon: Otis Brace, MD;  Location: [place];  Service: Gastroenterology;  Laterality: N/A;   EUS N/A 02/07/2016   Procedure: ESOPHAGEAL ENDOSCOPIC ULTRASOUND (EUS) RADIAL;  Surgeon: Arta Silence, MD;  [facility];  Service: Endoscopy;  Laterality: N/A;   EYE SURGERY Bilateral 2014   ioc for catracts    FOOT SURGERY Left 1990   something with toes unsure what    HERNIA REPAIR  2018   IR CATHETER TUBE CHANGE  03/14/2022   IR EMBO TUMOR ORGAN ISCHEMIA INFARCT INC GUIDE ROADMAPPING  08/22/2021   IR NEPHROSTOMY PLACEMENT LEFT  03/14/2022   IR RADIOLOGIST EVAL & MGMT  05/18/2021   IR RADIOLOGIST EVAL & MGMT  10/01/2021   IR RADIOLOGIST EVAL & MGMT  11/12/2021   IR RADIOLOGIST EVAL & MGMT  11/22/2021   IR RADIOLOGIST EVAL & MGMT  11/27/2021   IR RADIOLOGIST EVAL & MGMT  12/20/2021   IR RENAL SUPRASEL UNI S&I MOD SED  08/22/2021   IR US GUIDE VASC ACCESS RIGHT  08/22/2021   LEFT HEART CATH AND CORONARY ANGIOGRAPHY N/A 08/27/2021   Procedure: LEFT HEART CATH AND CORONARY ANGIOGRAPHY;  Surgeon: Burnell Blanks, MD;  Location: [place] [facility];  Service: Cardiovascular;  Laterality: N/A;   PARATHYROID EXPLORATION     PERIPHERAL VASCULAR BALLOON ANGIOPLASTY Left 08/30/2021   Procedure: PERIPHERAL VASCULAR BALLOON ANGIOPLASTY;  Surgeon: Marty Heck, MD;  Location: [place] [facility];  Service: Cardiovascular;  Laterality: Left;  arm fistula   RADIOLOGY WITH ANESTHESIA Left 08/22/2021   Procedure: MICROWAVE ABLATION;  Surgeon: Sandi Mariscal, MD;  [facility];  Service: Anesthesiology;  Laterality: Left;   WHIPPLE PROCEDURE N/A 04/23/2016   Procedure: WHIPPLE PROCEDURE;  Surgeon: Stark Klein, MD;  Location: [place];  Service: General;  Laterality: N/A;    Allergies: Lopid [gemfibrozil], Lotemax [loteprednol etabonate], Statins, Norco [hydrocodone-acetaminophen], Other, Tomato, Vibra-tab [doxycycline], Amlodipine, Codeine,  Hydrocodone, Hydrocodone-acetaminophen, Lisinopril, and Verapamil  Medications: Prior to Admission medications   Medication Sig Start Date End Date Taking? Authorizing Provider  acetaminophen (TYLENOL) 325 MG tablet Take 2 tablets (650 mg total) by mouth every 6 (six) hours as needed for mild pain, fever or headache. 01/30/22   Elgergawy, Silver Huguenin, MD  amiodarone (PACERONE) 200 MG tablet Take 0.5 tablets (100 mg total) by mouth daily. Please keep appointment for further refills 11/23/21   [place], Peter M, MD  carvedilol (COREG) 3.125 MG tablet Take 1 tablet (3.125 mg total) by mouth 2 (two) times daily with a meal. Take on the days you do not have dialysis Patient taking differently: Take 3.125 mg by mouth 2 (two) times daily with a meal. Take on the days you do not have dialysis( Sundays, Tuesdays, Thursdays, and Saturdays) 11/04/21   Mercy Riding, MD  Cholecalciferol (VITAMIN D-3 PO) Take 1 capsule by mouth every Monday, Wednesday, and Friday with hemodialysis.    [provider]  colestipol (COLESTID) 1 g tablet Take 1 g by mouth See admin instructions. 1 tablet (1g) prior to dialysis M-W-F + 1 tablet daily PRN diarrhea    [provider]  diclofenac sodium (VOLTAREN) 1 % GEL Apply 2 g topically 4 (four) times daily as needed (  for hand pain).    [provider]  ferrous sulfate 325 (65 FE) MG tablet Take 1 tablet (325 mg total) by mouth daily with breakfast. 01/30/22   Elgergawy, Silver Huguenin, MD  folic acid (FOLVITE) 1 MG tablet Take 1 tablet (1 mg total) by mouth daily. 01/31/22   Elgergawy, Silver Huguenin, MD  hydrALAZINE (APRESOLINE) 50 MG tablet Take 1 tablet (50 mg total) by mouth 3 (three) times daily. Take on non dialysis days 02/25/22   [place], Peter M, MD  insulin aspart (NOVOLOG) 100 UNIT/ML injection Inject 0-6 Units into the skin 3 (three) times daily with meals. 01/30/22   Elgergawy, Silver Huguenin, MD  lidocaine-prilocaine (EMLA) cream Apply 1 Application topically every  Monday, Wednesday, and Friday with hemodialysis.    [provider]  lipase/protease/amylase (CREON) 36000 UNITS CPEP capsule Take 36,000 Units by mouth 2 (two) times daily with a meal.    [provider]  olopatadine (PATADAY) 0.1 % ophthalmic solution Place 1 drop into both eyes 2 (two) times daily.    [provider]  pantoprazole (PROTONIX) 40 MG tablet Take 1 tablet (40 mg total) by mouth daily. 01/31/22   Elgergawy, Silver Huguenin, MD  RESTASIS 0.05 % ophthalmic emulsion 1 drop 2 (two) times daily. 11/06/21   [provider]  rOPINIRole (REQUIP) 0.25 MG tablet Take 0.25 mg by mouth at bedtime. 12/11/21   [provider]  senna (SENOKOT) 8.6 MG TABS tablet Take 1 tablet (8.6 mg total) by mouth at bedtime. 01/30/22   Elgergawy, Silver Huguenin, MD  sevelamer carbonate (RENVELA) 800 MG tablet Take 1,600 mg by mouth 3 (three) times daily with meals.    [provider]  sodium chloride flush (NS) 0.9 % SOLN 5 mLs by Intracatheter route every 8 (eight) hours. 11/04/21   Mercy Riding, MD  traMADol (ULTRAM) 50 MG tablet Take 1 tablet (50 mg total) by mouth every 12 (twelve) hours as needed for moderate pain or severe pain. 01/30/22   Elgergawy, Silver Huguenin, MD  XIIDRA 5 % SOLN Place 1 drop into both eyes at bedtime. 09/24/21   [provider]     Family History  Problem Relation Age of Onset   Heart disease Mother    Stroke Mother    Cancer Father        colon   Alzheimer's disease Sister    Alzheimer's disease Sister    Cancer Brother        brain   Breast cancer Neg Hx     Social History   Socioeconomic History   Marital status: Widowed    Spouse name: Not on file   Number of children: Not on file   Years of education: Not on file   Highest education level: Not on file  Occupational History   Not on file  Tobacco Use   Smoking status: Never   Smokeless tobacco: Never  Vaping Use   Vaping Use: Never used  Substance and Sexual  Activity   Alcohol use: No   Drug use: No   Sexual activity: Not on file  Other Topics Concern   Not on file  Social History Narrative   Not on file   Social Determinants of Health   Financial Resource Strain: Not on file  Food Insecurity: No Food Insecurity (11/01/2021)   Hunger Vital Sign    Worried About Running Out of Food in the Last Year: Never true    Ran Out of Food in the  Last Year: Never true  Transportation Needs: No Transportation Needs (11/01/2021)   PRAPARE - Hydrologist (Medical): No    Lack of Transportation (Non-Medical): No  Physical Activity: Not on file  Stress: Not on file  Social Connections: Not on file    ECOG Status: 2 - Symptomatic, <50% confined to bed  Review of Systems: A 12 point ROS discussed and pertinent positives are indicated in the HPI above.  All other systems are negative.  Review of Systems  Vital Signs: There were no vitals taken for this visit.   Physical Exam    Imaging:  Drain injection performed earlier today demonstrates appropriate positioning and functionality of left-sided percutaneous nephrostomy catheter and left-sided double-J ureteral stent without evidence urine leak to the level of the perinephric drainage catheter.    DG Sinus/Fist Tube Chk-Non GI  Result Date: 03/22/2022 CLINICAL DATA:  History of left-sided renal cell carcinoma, post endovascular embolization and microwave ablation on 08/22/2021. The patient developed development of a delayed urinary leak, ultimately requiring placement of a left-sided double-J ureteral stent on 10/21/2021 and ultimately a left-sided perinephric drainage catheter on 11/02/2021. She was subsequently admitted with development of a left-sided subcapsular renal hematoma and ultimately, despite prolonged conservative management, the patient's urinary leak has persisted. The patient underwent fluoroscopic guided exchange of the left-sided perinephric drainage  catheter, placement of a left-sided percutaneous nephrostomy catheter as well as attempted placement of a nephroureteral catheter and retrieval of double-J ureteral stent on 03/14/2022. She returns to the interventional radiology drain clinic for drainage catheter evaluation and management. The patient reports persistent large volume output (of approximally 100 to 200 cc per day) from the perinephric drainage catheter with small volume output (25-50 cc per day) from the percutaneous nephrostomy catheter. Preceding CT scan of the abdomen and pelvis demonstrates stable positioning of left-sided perinephric and nephrostomy drainage catheters without new perinephric fluid collection. EXAM: ABSCESS INJECTION COMPARISON:  CT abdomen and pelvis earlier same day; 01/23/2022 Fluoroscopic guided nephrostomy catheter placement and perinephric drainage catheter exchange-03/14/2022 CONTRAST:  10 cc Isovue-300-administered via the existing nephrostomy catheter. FLUOROSCOPY: 29 seconds (2.2 mGy) TECHNIQUE: The patient was positioned prone on the fluoroscopy table. A preprocedural spot fluoroscopic image was obtained of the left flank and existing nephrostomy and perinephric drainage catheters and the existing percutaneous drainage catheter. Multiple spot fluoroscopic and radiographic images were obtained following the injection of a small amount of contrast via the existing percutaneous nephrostomy catheter Images were reviewed and the perinephric drainage catheter was capped while the nephrostomy catheter was reconnected to a gravity bag. Dressings were applied. The patient tolerated the procedure well without immediate postprocedural complication. FINDINGS: Preprocedural spot fluoroscopic image demonstrates stable positioning of both the 8 French left-sided nephrostomy catheter as well as the 10 French perinephric drainage catheter as well as the superior aspect of the left-sided double-J ureteral stent. Embolization coils again  overlie the expected location of the inferior pole of the left kidney. Contrast injection of the nephrostomy catheter demonstrates appropriate positioning and functionality with brisk opacification of the left renal collecting system including passage of contrast to the level of the perinephric drainage catheter. IMPRESSION: Contrast injection demonstrates appropriate positioning of the left-sided nephrostomy catheter with persistent findings of a urine leak at the level of the superior aspect of the left ureter with communication to the perinephric drainage catheter. PLAN: The patient's nephrostomy catheter will be maintained to a gravity bag while the perinephric drainage catheter will be capped for  a trial of internalization. The hope is that capping of the perinephric drainage catheter we will allow for preferential drainage via the nephrostomy catheter in hopes of ultimately being able to remove the perinephric drainage catheter and solely utilizing the more stable nephrostomy catheter. Prolonged conversations were held with the patient the patient's son regarding the complexity of the patient's continued urine leak. They are both very understanding of the current situation. Presently, the patient would like to ONLY pursue embolization if there are no additional available treatment options and is open to the idea of maintaining (ideally) one drainage catheter which she understands would require exchanges every 8-10 weeks. Electronically Signed   By: Sandi Mariscal M.D.   On: 03/22/2022 16:10   CT ABDOMEN PELVIS W CONTRAST  Result Date: 03/22/2022 CLINICAL DATA:  History of left-sided renal cell carcinoma, post endovascular embolization and microwave ablation on 08/22/2021. Patient developed development of a delayed urinary leak, ultimately requiring placement of a left-sided double-J ureteral stent on 10/21/2021 and ultimately a left-sided perinephric drainage catheter on 11/02/2021. Patient was subsequently  admitted with development of a left-sided subcapsular renal hematoma and ultimately, despite prolonged conservative management, the patient's urinary leak has persisted. Patient underwent fluoroscopic guided exchange of the left-sided perinephric drainage catheter, placement of a left-sided percutaneous nephrostomy catheter as well as attempted placement of a nephroureteral catheter and retrieval of double-J ureteral stent on 03/14/2022. She returns to the interventional radiology drain clinic for drainage catheter evaluation and management. Patient reports persistent large volume output (of approximally 100 to 200 cc per day) from the perinephric drainage catheter with small volume output (25-50 cc per day) from the percutaneous nephrostomy catheter. EXAM: CT ABDOMEN AND PELVIS WITH CONTRAST TECHNIQUE: Multidetector CT imaging of the abdomen and pelvis was performed using the standard protocol following bolus administration of intravenous contrast. RADIATION DOSE REDUCTION: This exam was performed according to the departmental dose-optimization program which includes automated exposure control, adjustment of the mA and/or kV according to patient size and/or use of iterative reconstruction technique. CONTRAST:  129m ISOVUE-300 IOPAMIDOL (ISOVUE-300) INJECTION 61% COMPARISON:  Fluoroscopic guided nephrostomy catheter placement and drainage catheter exchange-03/14/2022 CT abdomen pelvis-01/23/2022; 01/22/2022; 05/03/2021 FINDINGS: Lower chest: Limited visualization of the lower thorax resolution of previously noted trace left-sided pleural effusion. No focal airspace opacities. Cardiomegaly. Coronary artery calcifications. No pericardial effusion. Hepatobiliary: Normal hepatic contour. No discrete hepatic lesions. Post cholecystectomy. No ascites. Pancreas: Stable postoperative change of the pancreas compatible with known history of Whipple. Spleen: Punctate subcentimeter hypoattenuating splenic lesions, potentially  cysts versus hemangiomas, unchanged. Adrenals/Urinary Tract: Redemonstrated atrophy of the bilateral kidneys with innumerable hypoattenuating nonenhancing renal cysts. Previously embolized and ablated partially exophytic mass arising from the inferior pole of the left kidney has significantly decreased in size compared to preprocedural CT scan performed 05/03/2021, presently measuring 2.8 x 2.3 cm (image 75, series 6), previously, 3.7 x 3.3 cm. Evaluation for residual enhancement is degraded secondary to acquisition of solitary portal venous phase. Interval involution previously noted large left-sided perinephric hematoma with residual organized hematoma measuring a proximally 11.8 x 8.8 x 4.8 cm (coronal image 90, series 6; axial image 22, series 22), previously, 19 x 9 x 5 cm. Interval retraction/slight repositioning of right-sided perinephric drainage catheter with stable positioning of new percutaneous nephrostomy catheter with end coiled and locked within the accessed renal calyx. Superior aspect of double-J ureteral stent terminates within mid pole calyx while inferior coil is within the urinary bladder. There is residual moderate-to-marked pelvicaliectasis involving superior collecting system  of the left kidney which was not opacified at the time of left-sided nephrostomy catheter placement performed 03/14/2022. No evidence of right-sided urinary obstruction. Redemonstrated bilateral perinephric stranding. Normal appearance of the bilateral adrenal glands. The urinary bladder is underdistended. Stomach/Bowel: Moderate colonic stool burden without evidence of enteric obstruction. Stable sequela of previous Whipple procedure without evidence of enteric obstruction. No significant hiatal hernia. No pneumoperitoneum, pneumatosis or portal venous gas. Vascular/Lymphatic: Moderate-to-large amount of atherosclerotic plaque within a tortuous but normal caliber abdominal aorta. The major branch vessels of the  abdominal aorta appear patent on this non CTA examination. No bulky retroperitoneal, mesenteric, pelvic or inguinal lymph adenopathy. Reproductive: Post hysterectomy. No discrete adnexal lesions. Multiple phleboliths are seen with the lower pelvis bilaterally. Note is made of right-sided gonadal vein phleboliths. Other: Mild diffuse body wall anasarca. Tiny mesenteric fat containing midline ventral abdominal wall hernias, unchanged. Musculoskeletal: No acute or aggressive osseous abnormalities. Moderate to severe multilevel lumbar spine DDD, worse at L2-L3 and L3-L4 with disc space height loss, endplate irregularity and small posteriorly directed disc osteophyte complexes at these locations. Moderate to severe degenerative change of the bilateral hips, right-greater-than-left. IMPRESSION: 1. Stable positioning of left-sided perinephric and nephrostomy drainage catheters without new perinephric fluid collection. Residual moderate to marked pelvicaliectasis involving the superior pole of the left kidney which was not opacified at the time of the fluoroscopic guided left-sided nephrostomy catheter placement performed 03/14/2022. 2. Interval involution and organization of left-sided perinephric hematoma with residual component measuring 1.8 cm in greatest diameter, previously, 19 cm. 3. Stable positioning of left-sided double-J ureteral stent. 4. Improved aeration of the lung bases with resolution of previously noted trace left-sided pleural effusion. 5. Stable sequela of previous Whipple procedure without evidence of enteric obstruction. 6.  Aortic Atherosclerosis (ICD10-I70.0). Electronically Signed   By: Sandi Mariscal M.D.   On: 03/22/2022 15:46   IR NEPHROSTOMY PLACEMENT LEFT  Result Date: 03/15/2022 INDICATION: History of left-sided renal cell carcinoma, post endovascular embolization and microwave ablation on 08/22/2021. Patient developed a delayed urinary leak requiring left-sided ureteral stent placement on  10/21/2021 and ultimately placement of a perinephric drainage catheter on 11/02/2021. Patient is perinephric drainage catheter has been conservatively managed since that time however her urine leak persists, reporting approximately 200 cc of output per day Patient presents today for drainage catheter injection with attempted percutaneous cannulation of the urine leak and or placement of a nephrostomy catheter with potential perinephric drainage catheter exchange and/or removal and attempted percutaneous ureteral stent removal. EXAM: 1. FLUOROSCOPIC GUIDED PERINEPHRIC DRAINAGE CATHETER INJECTION AND EXCHANGE 2. FLUOROSCOPIC GUIDED LEFT-SIDED NEPHROSTOMY CATHETER PLACEMENT 3. ATTEMPTED FLUOROSCOPIC GUIDED URETERAL STENT REMOVAL. COMPARISON:  Multiple previous fluoroscopic guided perinephric drainage catheter injections, most recently on 02/21/2022 CT abdomen and pelvis-01/23/2022; 01/22/2022 MEDICATIONS: Rocephin 2 gm IV; The antibiotic was administered in an appropriate time frame prior to skin puncture. ANESTHESIA/SEDATION: Moderate (conscious) sedation was employed during this procedure. A total of Versed 1 mg and Fentanyl 75 mcg was administered intravenously. Moderate Sedation Time: 95 minutes. The patient's level of consciousness and vital signs were monitored continuously by radiology nursing throughout the procedure under my direct supervision. CONTRAST:  60 cc Omnipaque 300-administered into the renal collecting system FLUOROSCOPY TIME:  44 minutes, 42 seconds (99991111 mGy) COMPLICATIONS: None immediate. PROCEDURE: The procedure, risks, benefits, and alternatives were explained to the patient, questions were encouraged and answered and informed consent was obtained. A timeout was performed prior to the initiation of the procedure. The external portion of the existing  left-sided perinephric drainage catheter as well as the surrounding skin was prepped and draped in usual sterile fashion Preprocedural spot  fluoroscopic image was obtained of the right upper abdominal quadrant Multiple spot fluoroscopic images were obtained following the administration of contrast via the perinephric drainage catheter. The external portion of the perinephric drainage catheter was cut and cannulated with a short Amplatz wire. Under intermittent fluoroscopic guidance, the perinephric drainage catheter was exchanged for a Kumpe catheter which was utilized to attempt to advanced a regular glidewire through the previously identified urine leak involving the inferior pole calyx. Ultimately, it became apparent that the residual leak was associated with the superior aspect of the left ureter with transient faint opacification of the left renal collecting system. Next, under fluoroscopic guidance, the drainage of the opacified left renal collecting system was accessed with a [place] needle. A Nitrex wire was advanced to the level of the left renal pelvis. Next, under fluoroscopic guidance, the Chiba needle was exchanged for the inner 3 French catheter of the Acc[place]ick set which allowed for further opacification of the left renal collecting system. Next, under fluoroscopic guidance, the previously injured inferior pole calyx was targeted with a 22 gauge needle. A Nitrex wire was advanced into the left renal collecting system and coiled within the superior pole calyx. Under intermittent fluoroscopic guidance, the Chiba needle was exchanged for a [place]ick set with contrast injection confirming appropriate positioning. Next, a 4 French angled glide catheter was inserted through the outer sheath of the Accustick set and utilized to attempt to advanced a Glidewire to the superior aspect of the left ureter however this ultimately proved unsuccessful As such, over a short Amplatz wire, the Accustick catheter was exchanged for an 8 [place] vascular sheath. Next, a rim catheter was utilized to attempt to advanced a stiff glide wire to the superior  aspect of the left ureter however this ultimately proved unsuccessful Next, a 2 loop snare was utilized to attempt to grasp the superior aspect of the left-sided ureteral stent however this ultimately proved unsuccessful. Finally, the 8 [place] vascular sheath was exchanged for an 8 [employer] catheter with end ultimately coiled and locked within the left renal pelvis. (Note, initially a 10 [place] all-purpose drainage catheter was attempted to be placed within the left renal pelvis however could not forearm secondary to the small size of the left renal collecting system). Finally, the 10 French perinephric drainage catheter was repositioned within the residual perinephric collection. Both drainage catheters were secured at the skin entrance surface site within interrupted sutures. Both drainage catheters were connected to gravity bags. Dressings were applied. The patient tolerated procedure well without immediate postprocedural complication. FINDINGS: Preprocedural spot fluoroscopic image demonstrates partial retraction of the left-sided perinephric drainage catheter Contrast injection demonstrates faint opacification of the left renal collecting system, not definitively via the previously identified injured inferior pole calyx but rather from the superior aspect of the left ureter. Attempts to recannulate the injured inferior pole calyx proved unsuccessful however utilizing a 2 stick technique as detailed above, access was achieved to the previously injured inferior pole calyx. Attempts were made to traverse the superior aspect of the left ureter however this ultimately proved unsuccessful. Contrast injection confirmed leakage at the superior aspect of the left ureter. Attempted though ultimately unsuccessful percutaneous retrieval of left-sided double-J ureteral stent secondary to positioning of the superior aspect of the ureteral stent within the superior aspect of the left renal collecting system and  diminutive left renal  pelvis. Ultimately, an 8 French Bettey Mare catheter was positioned with end coiled and locked within the left renal pelvis and 10 [place] all-purpose drainage catheter coiled within residual perinephric urinoma. IMPRESSION: 1. Persistent urine leak attributable to injury involving the superior aspect of the left ureter. 2. Technically successful placement of a 8 French Bettey Mare via previously injured left inferior pole renal calyx with end coiled and locked within the left renal pelvis. 3. Attempted though unsuccessful traversal of injured superior aspect of the left ureter. 4. Attempted though ultimately unsuccessful percutaneous retrieval of left-sided double-J ureteral stent. 5. Successful fluoroscopic guided exchange of 10 French perinephric urinoma drainage catheter. PLAN: Patient was instructed to maintain both drainage catheters to gravity bags. She was instructed not to flush either drainage catheter except for problem solving purposes such as if there is concern for catheter occlusion. Patient will be seen in interventional radiology drain clinic this coming Friday, 2/2, for CT of the abdomen pelvis, drainage catheter injection, evaluation and management. Above was discussed at length with patient's urologist, Dr. Louis Meckel. Ultimately, if above interventions prove unsuccessful, the patient might require renal embolization. Electronically Signed   By: Sandi Mariscal M.D.   On: 03/15/2022 09:51   IR Catheter Tube Change  Result Date: 03/15/2022 INDICATION: History of left-sided renal cell carcinoma, post endovascular embolization and microwave ablation on 08/22/2021. Patient developed a delayed urinary leak requiring left-sided ureteral stent placement on 10/21/2021 and ultimately placement of a perinephric drainage catheter on 11/02/2021. Patient is perinephric drainage catheter has been conservatively managed since that time however her urine leak persists, reporting  approximately 200 cc of output per day Patient presents today for drainage catheter injection with attempted percutaneous cannulation of the urine leak and or placement of a nephrostomy catheter with potential perinephric drainage catheter exchange and/or removal and attempted percutaneous ureteral stent removal. EXAM: 1. FLUOROSCOPIC GUIDED PERINEPHRIC DRAINAGE CATHETER INJECTION AND EXCHANGE 2. FLUOROSCOPIC GUIDED LEFT-SIDED NEPHROSTOMY CATHETER PLACEMENT 3. ATTEMPTED FLUOROSCOPIC GUIDED URETERAL STENT REMOVAL. COMPARISON:  Multiple previous fluoroscopic guided perinephric drainage catheter injections, most recently on 02/21/2022 CT abdomen and pelvis-01/23/2022; 01/22/2022 MEDICATIONS: Rocephin 2 gm IV; The antibiotic was administered in an appropriate time frame prior to skin puncture. ANESTHESIA/SEDATION: Moderate (conscious) sedation was employed during this procedure. A total of Versed 1 mg and Fentanyl 75 mcg was administered intravenously. Moderate Sedation Time: 95 minutes. The patient's level of consciousness and vital signs were monitored continuously by radiology nursing throughout the procedure under my direct supervision. CONTRAST:  60 cc Omnipaque 300-administered into the renal collecting system FLUOROSCOPY TIME:  44 minutes, 42 seconds (99991111 mGy) COMPLICATIONS: None immediate. PROCEDURE: The procedure, risks, benefits, and alternatives were explained to the patient, questions were encouraged and answered and informed consent was obtained. A timeout was performed prior to the initiation of the procedure. The external portion of the existing left-sided perinephric drainage catheter as well as the surrounding skin was prepped and draped in usual sterile fashion Preprocedural spot fluoroscopic image was obtained of the right upper abdominal quadrant Multiple spot fluoroscopic images were obtained following the administration of contrast via the perinephric drainage catheter. The external portion of the  perinephric drainage catheter was cut and cannulated with a short Amplatz wire. Under intermittent fluoroscopic guidance, the perinephric drainage catheter was exchanged for a [place] catheter which was utilized to attempt to advanced a regular glidewire through the previously identified urine leak involving the inferior pole calyx. Ultimately, it became apparent that the residual leak was associated with the  superior aspect of the left ureter with transient faint opacification of the left renal collecting system. Next, under fluoroscopic guidance, the drainage of the opacified left renal collecting system was accessed with a [place] needle. A Nitrex wire was advanced to the level of the left renal pelvis. Next, under fluoroscopic guidance, the Chiba needle was exchanged for the inner 3 French catheter of the Accustick set which allowed for further opacification of the left renal collecting system. Next, under fluoroscopic guidance, the previously injured inferior pole calyx was targeted with a 22 gauge needle. A Nitrex wire was advanced into the left renal collecting system and coiled within the superior pole calyx. Under intermittent fluoroscopic guidance, the Chiba needle was exchanged for a Accustick set with contrast injection confirming appropriate positioning. Next, a 4 French angled glide catheter was inserted through the outer sheath of the Accustick set and utilized to attempt to advanced a Glidewire to the superior aspect of the left ureter however this ultimately proved unsuccessful As such, over a short Amplatz wire, the Accustick catheter was exchanged for an 8 [place] vascular sheath. Next, a rim catheter was utilized to attempt to advanced a stiff glide wire to the superior aspect of the left ureter however this ultimately proved unsuccessful Next, a 2 loop snare was utilized to attempt to grasp the superior aspect of the left-sided ureteral stent however this ultimately proved unsuccessful.  Finally, the 8 [place] vascular sheath was exchanged for an 8 [employer] catheter with end ultimately coiled and locked within the left renal pelvis. (Note, initially a 10 [place] all-purpose drainage catheter was attempted to be placed within the left renal pelvis however could not forearm secondary to the small size of the left renal collecting system). Finally, the 10 French perinephric drainage catheter was repositioned within the residual perinephric collection. Both drainage catheters were secured at the skin entrance surface site within interrupted sutures. Both drainage catheters were connected to gravity bags. Dressings were applied. The patient tolerated procedure well without immediate postprocedural complication. FINDINGS: Preprocedural spot fluoroscopic image demonstrates partial retraction of the left-sided perinephric drainage catheter Contrast injection demonstrates faint opacification of the left renal collecting system, not definitively via the previously identified injured inferior pole calyx but rather from the superior aspect of the left ureter. Attempts to recannulate the injured inferior pole calyx proved unsuccessful however utilizing a 2 stick technique as detailed above, access was achieved to the previously injured inferior pole calyx. Attempts were made to traverse the superior aspect of the left ureter however this ultimately proved unsuccessful. Contrast injection confirmed leakage at the superior aspect of the left ureter. Attempted though ultimately unsuccessful percutaneous retrieval of left-sided double-J ureteral stent secondary to positioning of the superior aspect of the ureteral stent within the superior aspect of the left renal collecting system and diminutive left renal pelvis. Ultimately, an 8 French Bettey Mare catheter was positioned with end coiled and locked within the left renal pelvis and 10 [place] all-purpose drainage catheter coiled within residual  perinephric urinoma. IMPRESSION: 1. Persistent urine leak attributable to injury involving the superior aspect of the left ureter. 2. Technically successful placement of a 8 French Bettey Mare via previously injured left inferior pole renal calyx with end coiled and locked within the left renal pelvis. 3. Attempted though unsuccessful traversal of injured superior aspect of the left ureter. 4. Attempted though ultimately unsuccessful percutaneous retrieval of left-sided double-J ureteral stent. 5. Successful fluoroscopic guided exchange of 10 French perinephric urinoma drainage  catheter. PLAN: Patient was instructed to maintain both drainage catheters to gravity bags. She was instructed not to flush either drainage catheter except for problem solving purposes such as if there is concern for catheter occlusion. Patient will be seen in interventional radiology drain clinic this coming Friday, 2/2, for CT of the abdomen pelvis, drainage catheter injection, evaluation and management. Above was discussed at length with patient's urologist, Dr. Louis Meckel. Ultimately, if above interventions prove unsuccessful, the patient might require renal embolization. Electronically Signed   By: Sandi Mariscal M.D.   On: 03/15/2022 09:51    Labs:  CBC: Recent Labs    01/26/22 0230 01/28/22 0318 01/30/22 0501 03/14/22 1027  WBC 8.9 9.1 10.4 6.9  HGB 8.3* 9.1* 9.8* 11.0*  HCT 25.0* 28.1* 29.7* 35.1*  PLT 241 345 431* 218    COAGS: Recent Labs    10/29/21 1709 11/01/21 0630 01/22/22 0038 03/14/22 1027  INR 1.4* 1.2 1.4* 1.0  APTT  --   --  26  --     BMP: Recent Labs    01/26/22 0230 01/28/22 0318 01/30/22 1342 03/14/22 1027  NA 134* 131* 131* 141  K 3.6 3.8 4.0 4.0  CL 95* 93* 93* 100  CO2 26 26 26 29  $ GLUCOSE 142* 141* 160* 120*  BUN 25* 21 19 30*  CALCIUM 8.4* 8.6* 8.5* 8.2*  CREATININE 4.42* 4.42* 4.55* 4.17*  GFRNONAA 9* 9* 9* 10*    LIVER FUNCTION TESTS: Recent Labs    10/30/21 0226  10/31/21 0748 01/21/22 2305 01/22/22 0506 01/28/22 0318 01/30/22 1342 02/25/22 1426  BILITOT 0.7  --  0.4 0.4  --   --  0.3  AST 15  --  37 28  --   --  30  ALT 16  --  52* 47*  --   --  36*  ALKPHOS 62  --  55 45  --   --  110  PROT 6.3*  --  5.6* 5.2*  --   --  6.1  ALBUMIN 2.2*   < > 2.4* 2.3* 2.2* 2.3* 3.3*   < > = values in this interval not displayed.    TUMOR MARKERS: No results for input(s): "AFPTM", "CEA", "CA199", "CHROMGRNA" in the last 8760 hours.  Assessment and Plan:  History of left-sided renal cell carcinoma, post endovascular embolization and microwave ablation on 08/22/2021.   Patient developed a delayed urine leak, requiring placement of a left-sided double-J ureteral stent on 10/21/2021 and ultimately a left-sided perinephric drainage catheter on 11/02/2021.   Patient underwent fluoroscopic guided exchange of the left-sided perinephric drainage catheter, placement of a left-sided percutaneous nephrostomy catheter as well as attempted though ultimately unsuccessful retrieval of the left-sided double-J ureteral stent on 03/14/2022.   The patient was seen in the interventional radiology drain clinic on 03/22/2022 at which time the perinephric drainage catheter was capped and the left-sided nephrostomy catheter was maintained to external drainage.  Since that time, the patient reports persistent output from the nephrostomy catheter though overall less volume than was previously seen in her perinephric drainage catheter.  She also reports more frequent and voluminous urination.  Patient reports a mild subjective fever earlier this week however this has subsequently resolved without intervention.  She is overall tolerated the capping trial well.  Drain injection demonstrates appropriate positioning and functionality of left-sided percutaneous nephrostomy catheter and left-sided double-J ureteral stent without evidence urine leak to the level of the perinephric drainage  catheter.   As patient experienced  a transient subjective fever earlier this week, we will maintain the current management of both drainage catheters.  Specifically, the nephrostomy catheter will remain to external gravity and the perinephric drainage catheter will remain capped.  If patient were to experience recurrent persistent fevers, flank pain and/or nephrostomy malfunction, the patient and the patient's son were instructed to connect the perinephric drainage catheter to a gravity bag.   Otherwise, the patient will return to the interventional radiology drain clinic (upon her return from her planned trip to [place]) on March 25th for repeat drainage catheter injection.   If patient continues to tolerate her capping trial, and subsequent drain injection is again  negative for persistent urine leak, consideration for perinephric drainage catheter will be performed at that time.   PLAN: - Return to IR drain clinic on on March 25th for repeat drainage catheter injection (no CT).   A copy of this report was sent to the requesting provider on this date.  Electronically Signed: Sandi Mariscal 04/04/2022, 4:11 PM   I spent a total of 10 Minutes in face to face in clinical consultation, greater than 50% of which was counseling/coordinating care for nephrostomy and perinephric drainage catheter evaluation and management.

## 2022-04-05 ENCOUNTER — Other Ambulatory Visit: Payer: Self-pay | Admitting: Interventional Radiology

## 2022-04-05 DIAGNOSIS — N2889 Other specified disorders of kidney and ureter: Secondary | ICD-10-CM

## 2022-04-05 DIAGNOSIS — N2581 Secondary hyperparathyroidism of renal origin: Secondary | ICD-10-CM | POA: Diagnosis not present

## 2022-04-05 DIAGNOSIS — L299 Pruritus, unspecified: Secondary | ICD-10-CM | POA: Diagnosis not present

## 2022-04-05 DIAGNOSIS — N186 End stage renal disease: Secondary | ICD-10-CM | POA: Diagnosis not present

## 2022-04-05 DIAGNOSIS — Z992 Dependence on renal dialysis: Secondary | ICD-10-CM | POA: Diagnosis not present

## 2022-04-08 DIAGNOSIS — N2581 Secondary hyperparathyroidism of renal origin: Secondary | ICD-10-CM | POA: Diagnosis not present

## 2022-04-08 DIAGNOSIS — N186 End stage renal disease: Secondary | ICD-10-CM | POA: Diagnosis not present

## 2022-04-08 DIAGNOSIS — Z992 Dependence on renal dialysis: Secondary | ICD-10-CM | POA: Diagnosis not present

## 2022-04-08 DIAGNOSIS — L299 Pruritus, unspecified: Secondary | ICD-10-CM | POA: Diagnosis not present

## 2022-04-10 DIAGNOSIS — L299 Pruritus, unspecified: Secondary | ICD-10-CM | POA: Diagnosis not present

## 2022-04-10 DIAGNOSIS — Z992 Dependence on renal dialysis: Secondary | ICD-10-CM | POA: Diagnosis not present

## 2022-04-10 DIAGNOSIS — N2581 Secondary hyperparathyroidism of renal origin: Secondary | ICD-10-CM | POA: Diagnosis not present

## 2022-04-10 DIAGNOSIS — N186 End stage renal disease: Secondary | ICD-10-CM | POA: Diagnosis not present

## 2022-04-12 DIAGNOSIS — N2581 Secondary hyperparathyroidism of renal origin: Secondary | ICD-10-CM | POA: Diagnosis not present

## 2022-04-12 DIAGNOSIS — L299 Pruritus, unspecified: Secondary | ICD-10-CM | POA: Diagnosis not present

## 2022-04-12 DIAGNOSIS — Z992 Dependence on renal dialysis: Secondary | ICD-10-CM | POA: Diagnosis not present

## 2022-04-12 DIAGNOSIS — N186 End stage renal disease: Secondary | ICD-10-CM | POA: Diagnosis not present

## 2022-04-15 DIAGNOSIS — L299 Pruritus, unspecified: Secondary | ICD-10-CM | POA: Diagnosis not present

## 2022-04-15 DIAGNOSIS — Z992 Dependence on renal dialysis: Secondary | ICD-10-CM | POA: Diagnosis not present

## 2022-04-15 DIAGNOSIS — N2581 Secondary hyperparathyroidism of renal origin: Secondary | ICD-10-CM | POA: Diagnosis not present

## 2022-04-15 DIAGNOSIS — N186 End stage renal disease: Secondary | ICD-10-CM | POA: Diagnosis not present

## 2022-04-17 DIAGNOSIS — N186 End stage renal disease: Secondary | ICD-10-CM | POA: Diagnosis not present

## 2022-04-17 DIAGNOSIS — Z992 Dependence on renal dialysis: Secondary | ICD-10-CM | POA: Diagnosis not present

## 2022-04-18 DIAGNOSIS — Z992 Dependence on renal dialysis: Secondary | ICD-10-CM | POA: Diagnosis not present

## 2022-04-18 DIAGNOSIS — I158 Other secondary hypertension: Secondary | ICD-10-CM | POA: Diagnosis not present

## 2022-04-18 DIAGNOSIS — N186 End stage renal disease: Secondary | ICD-10-CM | POA: Diagnosis not present

## 2022-04-19 DIAGNOSIS — Z992 Dependence on renal dialysis: Secondary | ICD-10-CM | POA: Diagnosis not present

## 2022-04-19 DIAGNOSIS — N186 End stage renal disease: Secondary | ICD-10-CM | POA: Diagnosis not present

## 2022-04-22 DIAGNOSIS — Z992 Dependence on renal dialysis: Secondary | ICD-10-CM | POA: Diagnosis not present

## 2022-04-22 DIAGNOSIS — N186 End stage renal disease: Secondary | ICD-10-CM | POA: Diagnosis not present

## 2022-04-23 ENCOUNTER — Ambulatory Visit: Payer: Medicare PPO | Admitting: Podiatry

## 2022-04-24 DIAGNOSIS — Z992 Dependence on renal dialysis: Secondary | ICD-10-CM | POA: Diagnosis not present

## 2022-04-24 DIAGNOSIS — N186 End stage renal disease: Secondary | ICD-10-CM | POA: Diagnosis not present

## 2022-04-26 DIAGNOSIS — N186 End stage renal disease: Secondary | ICD-10-CM | POA: Diagnosis not present

## 2022-04-26 DIAGNOSIS — Z992 Dependence on renal dialysis: Secondary | ICD-10-CM | POA: Diagnosis not present

## 2022-04-30 DIAGNOSIS — Z992 Dependence on renal dialysis: Secondary | ICD-10-CM | POA: Diagnosis not present

## 2022-04-30 DIAGNOSIS — N186 End stage renal disease: Secondary | ICD-10-CM | POA: Diagnosis not present

## 2022-05-02 DIAGNOSIS — Z992 Dependence on renal dialysis: Secondary | ICD-10-CM | POA: Diagnosis not present

## 2022-05-02 DIAGNOSIS — N186 End stage renal disease: Secondary | ICD-10-CM | POA: Diagnosis not present

## 2022-05-06 DIAGNOSIS — Z992 Dependence on renal dialysis: Secondary | ICD-10-CM | POA: Diagnosis not present

## 2022-05-06 DIAGNOSIS — N186 End stage renal disease: Secondary | ICD-10-CM | POA: Diagnosis not present

## 2022-05-08 DIAGNOSIS — N186 End stage renal disease: Secondary | ICD-10-CM | POA: Diagnosis not present

## 2022-05-08 DIAGNOSIS — Z992 Dependence on renal dialysis: Secondary | ICD-10-CM | POA: Diagnosis not present

## 2022-05-11 DIAGNOSIS — Z992 Dependence on renal dialysis: Secondary | ICD-10-CM | POA: Diagnosis not present

## 2022-05-11 DIAGNOSIS — N186 End stage renal disease: Secondary | ICD-10-CM | POA: Diagnosis not present

## 2022-05-11 DIAGNOSIS — N2581 Secondary hyperparathyroidism of renal origin: Secondary | ICD-10-CM | POA: Diagnosis not present

## 2022-05-13 ENCOUNTER — Other Ambulatory Visit: Payer: Medicare PPO

## 2022-05-13 DIAGNOSIS — N2581 Secondary hyperparathyroidism of renal origin: Secondary | ICD-10-CM | POA: Diagnosis not present

## 2022-05-13 DIAGNOSIS — Z992 Dependence on renal dialysis: Secondary | ICD-10-CM | POA: Diagnosis not present

## 2022-05-13 DIAGNOSIS — N186 End stage renal disease: Secondary | ICD-10-CM | POA: Diagnosis not present

## 2022-05-15 DIAGNOSIS — N2581 Secondary hyperparathyroidism of renal origin: Secondary | ICD-10-CM | POA: Diagnosis not present

## 2022-05-15 DIAGNOSIS — N186 End stage renal disease: Secondary | ICD-10-CM | POA: Diagnosis not present

## 2022-05-15 DIAGNOSIS — Z992 Dependence on renal dialysis: Secondary | ICD-10-CM | POA: Diagnosis not present

## 2022-05-17 ENCOUNTER — Ambulatory Visit
Admission: RE | Admit: 2022-05-17 | Discharge: 2022-05-17 | Disposition: A | Discharge status: Home / Self Care | Payer: Medicare PPO | Source: Ambulatory Visit | Attending: Interventional Radiology | Admitting: Interventional Radiology

## 2022-05-17 DIAGNOSIS — N2889 Other specified disorders of kidney and ureter: Secondary | ICD-10-CM

## 2022-05-17 DIAGNOSIS — Z4682 Encounter for fitting and adjustment of non-vascular catheter: Secondary | ICD-10-CM | POA: Diagnosis not present

## 2022-05-17 DIAGNOSIS — N179 Acute kidney failure, unspecified: Secondary | ICD-10-CM | POA: Diagnosis not present

## 2022-05-17 DIAGNOSIS — Z992 Dependence on renal dialysis: Secondary | ICD-10-CM | POA: Diagnosis not present

## 2022-05-17 DIAGNOSIS — N186 End stage renal disease: Secondary | ICD-10-CM | POA: Diagnosis not present

## 2022-05-17 DIAGNOSIS — N2581 Secondary hyperparathyroidism of renal origin: Secondary | ICD-10-CM | POA: Diagnosis not present

## 2022-05-17 DIAGNOSIS — Z466 Encounter for fitting and adjustment of urinary device: Secondary | ICD-10-CM | POA: Diagnosis not present

## 2022-05-17 MED ORDER — IOPAMIDOL (ISOVUE-300) INJECTION 61%
8.0000 mL | Freq: Once | INTRAVENOUS | Status: AC | PRN
  Administered 2022-05-17: 8 mL

## 2022-05-17 NOTE — Progress Notes (Signed)
Patient ID: Kim Burns, female   DOB: 19-Sep-1935, 87 y.o.   MRN: [id number]        Chief Complaint: Delayed urine leak following renal embolization and ablation  Referring Physician(s): Louis Meckel  History of Present Illness: Kim Burns is a 87 y.o. female history significant for left-sided renal cell carcinoma, post endovascular embolization and microwave ablation on 08/22/2021.  The patient developed a delayed urinary leak, ultimately requiring placement of a left-sided double-J ureteral stent on 10/21/2021 and ultimately a left-sided perinephric drainage catheter on 11/02/2021.  Patient returns interventional radiology drain clinic for drainage catheter evaluation and management.  She is again accompanied by her son.  The patient underwent fluoroscopic guided exchange of the left-sided perinephric drainage catheter and placement of a left-sided percutaneous nephrostomy catheter on 03/14/2022.  The patient was last seen at the interventional radiology drain clinic on 04/04/2022 at which time the decision was made to cap the perinephric drainage catheter and to maintain the nephrostomy catheter to a gravity bag.  Since that time, patient has experienced approximately 50 to 100 cc of output from the nephrostomy catheter.  She has not experienced worsening flank pain or fever or chills since capping of the perinephric drainage catheter.  Again, despite history of end-stage renal disease, on dialysis, patient continues to make urine, voiding several times per day.  Past Medical History:  Diagnosis Date   Anemia of chronic disease    takes iron   Anxiety    Blood transfusion without reported diagnosis    Bradycardia    Diabetes mellitus without complication ([place])    became diabetic after Whipple procedure   Diarrhea    Dysrhythmia 04/16/2016   bradycardia due to medication    ESRD on hemodialysis [facility])    M-W-F   GERD (gastroesophageal reflux disease)    Gout    Headache     History of kidney stones 06/2013   Hyperparathyroidism ([place])    Hypertension    PONV (postoperative nausea and vomiting)    Sleep apnea    no cpap machine. could not tolerate   Stroke ([place]) 04/16/2016   TIA 1995   Vitamin D deficiency     Past Surgical History:  Procedure Laterality Date   A/V FISTULAGRAM Left 08/30/2021   Procedure: A/V Fistulagram;  Surgeon: Marty Heck, MD;  Location: [place] [facility];  Service: Cardiovascular;  Laterality: Left;   ABDOMINAL HYSTERECTOMY  1985   complete   BACK SURGERY  [id number]   lower   BASCILIC VEIN TRANSPOSITION Left 04/24/2017   Procedure: LEFT ARM FIRST STAGE BASILIC VEIN TRANSPOSITION;  Surgeon: Serafina Mitchell, MD;  Location: [facility] OR;  Service: Vascular;  Laterality: Left;   [place] Left 07/10/2017   Procedure: SECOND STAGE BASILIC VEIN TRANSPOSITION LEFT ARM;  Surgeon: Serafina Mitchell, MD;  Location: [facility] OR;  Service: Vascular;  Laterality: Left;   BREAST LUMPECTOMY Left x 2   many years apart, benign   CHOLECYSTECTOMY  1985   CYSTOSCOPY W/ URETERAL STENT PLACEMENT Left 10/31/2021   Procedure: CYSTOSCOPY WITH RETROGRADE PYELOGRAM/URETERAL STENT PLACEMENT;  Surgeon: Ardis Hughs, MD;  Location: [place];  Service: Urology;  Laterality: Left;   CYSTOSCOPY WITH URETEROSCOPY AND STENT PLACEMENT Left 06/18/2013   Procedure: CYSTOSCOPY WITH Lef URETEROSCOPY AND Left STENT PLACEMENT;  Surgeon: Dutch Gray, MD;  Location: [facility];  Service: Urology;  Laterality: Left;   ESOPHAGOGASTRODUODENOSCOPY (EGD) WITH PROPOFOL N/A 11/13/2016   Procedure: ESOPHAGOGASTRODUODENOSCOPY (EGD) WITH PROPOFOL;  Surgeon: Otis Brace, MD;  Location: [facility] ENDOSCOPY;  Service: Gastroenterology;  Laterality: N/A;   EUS N/A 02/07/2016   Procedure: ESOPHAGEAL ENDOSCOPIC ULTRASOUND (EUS) RADIAL;  Surgeon: Arta Silence, MD;  Location: [facility];  Service: Endoscopy;  Laterality: N/A;   EYE SURGERY Bilateral 2014   ioc for catracts     FOOT SURGERY Left 1990   something with toes unsure what    HERNIA REPAIR  2018   IR CATHETER TUBE CHANGE  03/14/2022   IR EMBO TUMOR ORGAN ISCHEMIA INFARCT INC GUIDE ROADMAPPING  08/22/2021   IR NEPHROSTOMY PLACEMENT LEFT  03/14/2022   IR RADIOLOGIST EVAL & MGMT  05/18/2021   IR RADIOLOGIST EVAL & MGMT  10/01/2021   IR RADIOLOGIST EVAL & MGMT  11/12/2021   IR RADIOLOGIST EVAL & MGMT  11/22/2021   IR RADIOLOGIST EVAL & MGMT  11/27/2021   IR RADIOLOGIST EVAL & MGMT  12/20/2021   IR RENAL SUPRASEL UNI S&I MOD SED  08/22/2021   IR US GUIDE VASC ACCESS RIGHT  08/22/2021   LEFT HEART CATH AND CORONARY ANGIOGRAPHY N/A 08/27/2021   Procedure: LEFT HEART CATH AND CORONARY ANGIOGRAPHY;  Surgeon: Burnell Blanks, MD;  Location: [place] [facility];  Service: Cardiovascular;  Laterality: N/A;   PARATHYROID EXPLORATION     PERIPHERAL VASCULAR BALLOON ANGIOPLASTY Left 08/30/2021   Procedure: PERIPHERAL VASCULAR BALLOON ANGIOPLASTY;  Surgeon: Marty Heck, MD;  Location: [place] [facility];  Service: Cardiovascular;  Laterality: Left;  arm fistula   RADIOLOGY WITH ANESTHESIA Left 08/22/2021   Procedure: MICROWAVE ABLATION;  Surgeon: Sandi Mariscal, MD;  Location: [facility];  Service: Anesthesiology;  Laterality: Left;   WHIPPLE PROCEDURE N/A 04/23/2016   Procedure: WHIPPLE PROCEDURE;  Surgeon: Stark Klein, MD;  Location: [place];  Service: General;  Laterality: N/A;    Allergies: Lopid [gemfibrozil], Lotemax [loteprednol etabonate], Statins, Norco [hydrocodone-acetaminophen], Other, Tomato, Vibra-tab [doxycycline], Amlodipine, Codeine, Hydrocodone, Hydrocodone-acetaminophen, Lisinopril, and Verapamil  Medications: Prior to Admission medications   Medication Sig Start Date End Date Taking? Authorizing Provider  acetaminophen (TYLENOL) 325 MG tablet Take 2 tablets (650 mg total) by mouth every 6 (six) hours as needed for mild pain, fever or headache. 01/30/22   Elgergawy, Silver Huguenin, MD  amiodarone (PACERONE)  200 MG tablet Take 0.5 tablets (100 mg total) by mouth daily. Please keep appointment for further refills 11/23/21   [place]ique, Peter M, MD  carvedilol (COREG) 3.125 MG tablet Take 1 tablet (3.125 mg total) by mouth 2 (two) times daily with a meal. Take on the days you do not have dialysis Patient taking differently: Take 3.125 mg by mouth 2 (two) times daily with a meal. Take on the days you do not have dialysis( Sundays, Tuesdays, Thursdays, and Saturdays) 11/04/21   Mercy Riding, MD  Cholecalciferol (VITAMIN D-3 PO) Take 1 capsule by mouth every Monday, Wednesday, and Friday with hemodialysis.    [provider]  colestipol (COLESTID) 1 g tablet Take 1 g by mouth See admin instructions. 1 tablet (1g) prior to dialysis M-W-F + 1 tablet daily PRN diarrhea    [provider]  diclofenac sodium (VOLTAREN) 1 % GEL Apply 2 g topically 4 (four) times daily as needed (for hand pain).    [provider]  ferrous sulfate 325 (65 FE) MG tablet Take 1 tablet (325 mg total) by mouth daily with breakfast. 01/30/22   Elgergawy, Silver Huguenin, MD  folic acid (FOLVITE) 1 MG tablet Take 1 tablet (1 mg total)  by mouth daily. 01/31/22   Elgergawy, Silver Huguenin, MD  hydrALAZINE (APRESOLINE) 50 MG tablet Take 1 tablet (50 mg total) by mouth 3 (three) times daily. Take on non dialysis days 02/25/22   Martinique, Peter M, MD  insulin aspart (NOVOLOG) 100 UNIT/ML injection Inject 0-6 Units into the skin 3 (three) times daily with meals. 01/30/22   Elgergawy, Silver Huguenin, MD  lidocaine-prilocaine (EMLA) cream Apply 1 Application topically every Monday, Wednesday, and Friday with hemodialysis.    [provider]  lipase/protease/amylase (CREON) 36000 UNITS CPEP capsule Take 36,000 Units by mouth 2 (two) times daily with a meal.    [provider]  olopatadine (PATADAY) 0.1 % ophthalmic solution Place 1 drop into both eyes 2 (two) times daily.    [provider]  pantoprazole (PROTONIX) 40 MG  tablet Take 1 tablet (40 mg total) by mouth daily. 01/31/22   Elgergawy, Silver Huguenin, MD  RESTASIS 0.05 % ophthalmic emulsion 1 drop 2 (two) times daily. 11/06/21   [provider]  rOPINIRole (REQUIP) 0.25 MG tablet Take 0.25 mg by mouth at bedtime. 12/11/21   [provider]  senna (SENOKOT) 8.6 MG TABS tablet Take 1 tablet (8.6 mg total) by mouth at bedtime. 01/30/22   Elgergawy, Silver Huguenin, MD  sevelamer carbonate (RENVELA) 800 MG tablet Take 1,600 mg by mouth 3 (three) times daily with meals.    [provider]  sodium chloride flush (NS) 0.9 % SOLN 5 mLs by Intracatheter route every 8 (eight) hours. 11/04/21   Mercy Riding, MD  traMADol (ULTRAM) 50 MG tablet Take 1 tablet (50 mg total) by mouth every 12 (twelve) hours as needed for moderate pain or severe pain. 01/30/22   Elgergawy, Silver Huguenin, MD  XIIDRA 5 % SOLN Place 1 drop into both eyes at bedtime. 09/24/21   [provider]     Family History  Problem Relation Age of Onset   Heart disease Mother    Stroke Mother    Cancer Father        colon   Alzheimer's disease Sister    Alzheimer's disease Sister    Cancer Brother        brain   Breast cancer Neg Hx     Social History   Socioeconomic History   Marital status: Widowed    Spouse name: Not on file   Number of children: Not on file   Years of education: Not on file   Highest education level: Not on file  Occupational History   Not on file  Tobacco Use   Smoking status: Never   Smokeless tobacco: Never  Vaping Use   Vaping Use: Never used  Substance and Sexual Activity   Alcohol use: No   Drug use: No   Sexual activity: Not on file  Other Topics Concern   Not on file  Social History Narrative   Not on file   Social Determinants of Health   Financial Resource Strain: Not on file  Food Insecurity: No Food Insecurity (11/01/2021)   Hunger Vital Sign    Worried About Running Out of Food in the Last Year: Never true    Ran Out of Food  in the Last Year: Never true  Transportation Needs: No Transportation Needs (11/01/2021)   PRAPARE - Hydrologist (Medical): No    Lack of Transportation (Non-Medical): No  Physical Activity: Not on file  Stress: Not on file  Social Connections: Not on  file    ECOG Status: 2 - Symptomatic, <50% confined to bed  Review of Systems: A 12 point ROS discussed and pertinent positives are indicated in the HPI above.  All other systems are negative.  Review of Systems  Vital Signs: BP (!) 157/76   Pulse 77   Temp 97.6 F (36.4 C)   SpO2 99%      Physical Exam   Imaging: No results found.  Labs:  CBC: Recent Labs    01/26/22 0230 01/28/22 0318 01/30/22 0501 03/14/22 1027  WBC 8.9 9.1 10.4 6.9  HGB 8.3* 9.1* 9.8* 11.0*  HCT 25.0* 28.1* 29.7* 35.1*  PLT 241 345 431* 218    COAGS: Recent Labs    10/29/21 1709 11/01/21 0630 01/22/22 0038 03/14/22 1027  INR 1.4* 1.2 1.4* 1.0  APTT  --   --  26  --     BMP: Recent Labs    01/26/22 0230 01/28/22 0318 01/30/22 1342 03/14/22 1027  NA 134* 131* 131* 141  K 3.6 3.8 4.0 4.0  CL 95* 93* 93* 100  CO2 26 26 26 29   GLUCOSE 142* 141* 160* 120*  BUN 25* 21 19 30*  CALCIUM 8.4* 8.6* 8.5* 8.2*  CREATININE 4.42* 4.42* 4.55* 4.17*  GFRNONAA 9* 9* 9* 10*    LIVER FUNCTION TESTS: Recent Labs    10/30/21 0226 10/31/21 0748 01/21/22 2305 01/22/22 0506 01/28/22 0318 01/30/22 1342 02/25/22 1426  BILITOT 0.7  --  0.4 0.4  --   --  0.3  AST 15  --  37 28  --   --  30  ALT 16  --  52* 47*  --   --  36*  ALKPHOS 62  --  55 45  --   --  110  PROT 6.3*  --  5.6* 5.2*  --   --  6.1  ALBUMIN 2.2*   < > 2.4* 2.3* 2.2* 2.3* 3.3*   < > = values in this interval not displayed.    TUMOR MARKERS: No results for input(s): "AFPTM", "CEA", "CA199", "CHROMGRNA" in the last 8760 hours.  Assessment and Plan:  Kim Burns is a 87 y.o. female history significant for left-sided renal cell  carcinoma, post endovascular embolization and microwave ablation on 08/22/2021.  The patient developed a delayed urinary leak, ultimately requiring placement of a left-sided double-J ureteral stent on 10/21/2021 and ultimately a left-sided perinephric drainage catheter on 11/02/2021.  Patient returns interventional radiology drain clinic for drainage catheter evaluation and management.  She is again accompanied by her son..  Antegrade nephrostogram via percutaneous nephrostomy catheter demonstrates appropriate positioning and functionality of both the nephrostomy catheter as well as the left-sided double-J ureteral stent without definitive communication to the left-sided perinephric drainage catheter.  As such, the left-sided perinephric drainage catheter was removed following this greater than 1 month capping trial.  PLAN:  - The patient's left-sided ureteral stent has been in place since 10/21/2021 and per report was quite challenging to insert.  As such, would recommend attempted left-sided double-J ureteral stent exchange.    - IF the ureteral stent is able to be exchanged, the patient will return for a repeat antegrade nephrostogram and initiation of a nephrostomy catheter capping trial in hopes of eventually being able to remove the PCN.   - IF the ureteral stent is unable to be exchanged, would recommend routine exchange and potential up sizing of the left-sided percutaneous nephrostomy catheter.   Above was discussed at great  length with the patient and the patient's son who both demonstrated excellent understanding and are in agreement with the proposed plan of care.   A copy of this report was sent to the requesting provider on this date.  Electronically Signed: Sandi Mariscal 05/17/2022, 3:07 PM   I spent a total of 10 Minutes in face to face in clinical consultation, greater than 50% of which was counseling/coordinating care for drainage catheter evaluation and management.

## 2022-05-19 DIAGNOSIS — N186 End stage renal disease: Secondary | ICD-10-CM | POA: Diagnosis not present

## 2022-05-19 DIAGNOSIS — I158 Other secondary hypertension: Secondary | ICD-10-CM | POA: Diagnosis not present

## 2022-05-19 DIAGNOSIS — Z992 Dependence on renal dialysis: Secondary | ICD-10-CM | POA: Diagnosis not present

## 2022-05-20 DIAGNOSIS — N2581 Secondary hyperparathyroidism of renal origin: Secondary | ICD-10-CM | POA: Diagnosis not present

## 2022-05-20 DIAGNOSIS — D649 Anemia, unspecified: Secondary | ICD-10-CM | POA: Diagnosis not present

## 2022-05-20 DIAGNOSIS — Z992 Dependence on renal dialysis: Secondary | ICD-10-CM | POA: Diagnosis not present

## 2022-05-20 DIAGNOSIS — N186 End stage renal disease: Secondary | ICD-10-CM | POA: Diagnosis not present

## 2022-05-20 DIAGNOSIS — E559 Vitamin D deficiency, unspecified: Secondary | ICD-10-CM | POA: Diagnosis not present

## 2022-05-20 DIAGNOSIS — I10 Essential (primary) hypertension: Secondary | ICD-10-CM | POA: Diagnosis not present

## 2022-05-22 DIAGNOSIS — N186 End stage renal disease: Secondary | ICD-10-CM | POA: Diagnosis not present

## 2022-05-22 DIAGNOSIS — N2581 Secondary hyperparathyroidism of renal origin: Secondary | ICD-10-CM | POA: Diagnosis not present

## 2022-05-22 DIAGNOSIS — Z992 Dependence on renal dialysis: Secondary | ICD-10-CM | POA: Diagnosis not present

## 2022-05-23 DIAGNOSIS — D49511 Neoplasm of unspecified behavior of right kidney: Secondary | ICD-10-CM | POA: Diagnosis not present

## 2022-05-24 ENCOUNTER — Other Ambulatory Visit: Payer: Self-pay | Admitting: *Deleted

## 2022-05-24 DIAGNOSIS — N2581 Secondary hyperparathyroidism of renal origin: Secondary | ICD-10-CM | POA: Diagnosis not present

## 2022-05-24 DIAGNOSIS — Z992 Dependence on renal dialysis: Secondary | ICD-10-CM | POA: Diagnosis not present

## 2022-05-24 DIAGNOSIS — N186 End stage renal disease: Secondary | ICD-10-CM | POA: Diagnosis not present

## 2022-05-24 MED ORDER — CARVEDILOL 3.125 MG PO TABS: 3.1250 mg | ORAL_TABLET | Freq: Two times a day (BID) | ORAL | 1 refills | Status: DC

## 2022-05-27 ENCOUNTER — Other Ambulatory Visit: Payer: Self-pay | Admitting: *Deleted

## 2022-05-27 DIAGNOSIS — N186 End stage renal disease: Secondary | ICD-10-CM | POA: Diagnosis not present

## 2022-05-27 DIAGNOSIS — I509 Heart failure, unspecified: Secondary | ICD-10-CM | POA: Diagnosis not present

## 2022-05-27 DIAGNOSIS — D509 Iron deficiency anemia, unspecified: Secondary | ICD-10-CM | POA: Diagnosis not present

## 2022-05-27 DIAGNOSIS — D179 Benign lipomatous neoplasm, unspecified: Secondary | ICD-10-CM | POA: Diagnosis not present

## 2022-05-27 DIAGNOSIS — C642 Malignant neoplasm of left kidney, except renal pelvis: Secondary | ICD-10-CM | POA: Diagnosis not present

## 2022-05-27 DIAGNOSIS — T82590A Other mechanical complication of surgically created arteriovenous fistula, initial encounter: Secondary | ICD-10-CM | POA: Diagnosis not present

## 2022-05-27 DIAGNOSIS — N2581 Secondary hyperparathyroidism of renal origin: Secondary | ICD-10-CM | POA: Diagnosis not present

## 2022-05-27 DIAGNOSIS — Z992 Dependence on renal dialysis: Secondary | ICD-10-CM | POA: Diagnosis not present

## 2022-05-27 DIAGNOSIS — I10 Essential (primary) hypertension: Secondary | ICD-10-CM | POA: Diagnosis not present

## 2022-05-27 DIAGNOSIS — E559 Vitamin D deficiency, unspecified: Secondary | ICD-10-CM | POA: Diagnosis not present

## 2022-05-28 DIAGNOSIS — E119 Type 2 diabetes mellitus without complications: Secondary | ICD-10-CM | POA: Diagnosis not present

## 2022-05-28 DIAGNOSIS — H04123 Dry eye syndrome of bilateral lacrimal glands: Secondary | ICD-10-CM | POA: Diagnosis not present

## 2022-05-29 DIAGNOSIS — N186 End stage renal disease: Secondary | ICD-10-CM | POA: Diagnosis not present

## 2022-05-29 DIAGNOSIS — Z992 Dependence on renal dialysis: Secondary | ICD-10-CM | POA: Diagnosis not present

## 2022-05-29 DIAGNOSIS — N2581 Secondary hyperparathyroidism of renal origin: Secondary | ICD-10-CM | POA: Diagnosis not present

## 2022-05-30 DIAGNOSIS — D49512 Neoplasm of unspecified behavior of left kidney: Secondary | ICD-10-CM | POA: Diagnosis not present

## 2022-05-30 DIAGNOSIS — N130 Hydronephrosis with ureteropelvic junction obstruction: Secondary | ICD-10-CM | POA: Diagnosis not present

## 2022-05-31 DIAGNOSIS — N186 End stage renal disease: Secondary | ICD-10-CM | POA: Diagnosis not present

## 2022-05-31 DIAGNOSIS — Z992 Dependence on renal dialysis: Secondary | ICD-10-CM | POA: Diagnosis not present

## 2022-05-31 DIAGNOSIS — N2581 Secondary hyperparathyroidism of renal origin: Secondary | ICD-10-CM | POA: Diagnosis not present

## 2022-06-03 DIAGNOSIS — N186 End stage renal disease: Secondary | ICD-10-CM | POA: Diagnosis not present

## 2022-06-03 DIAGNOSIS — N2581 Secondary hyperparathyroidism of renal origin: Secondary | ICD-10-CM | POA: Diagnosis not present

## 2022-06-03 DIAGNOSIS — Z992 Dependence on renal dialysis: Secondary | ICD-10-CM | POA: Diagnosis not present

## 2022-06-04 NOTE — Progress Notes (Unsigned)
Cardiology Office Note   Date:  02/25/2022   ID:  Kim, Burns 12-28-1935, MRN [id number]  PCP:  Lewis Moccasin, MD  Cardiologist:   Kammy Klett [place], MD   No chief complaint on file.     History of Present Illness: Kim Burns is a 87 y.o. female who is seen for follow up Afib and orthostatic hypotension. She has a history of CKD and HTN. Echo in 2015 showed LVH with normal systolic function. She had a Whipple procedure in 2014 with pancreatectomy and partial bowel resection. She developed ESRD felt to be related to DM and HTN. She has been on hemodialysis.  With dialysis she has developed orthostatic hypotension that is worse on dialysis days. Her antihypertensives were discontinued and she was placed on  midodrine on dialysis days.  She developed  new onset atrial fibrillation with RVR during hospitalization in early July 2023 after an ablation procedure for left renal cell carcinoma. She was started on rate control agents as well as Eliquis for stroke prophylaxis.  She had spontaneously converted back to sinus rhythm prior to discharge after receiving IV Cardizem. CTA of abdomen and chest did not show any evidence of PE, 3 mm left pulmonary nodule noted, stable solid mass in the medial aspect of the left kidney.  Diagnostic cardiac catheterization on 7/10 due to elevated troponins on 7/8 revealed nonobstructive disease with 20% proximal RCA, 20% left circumflex artery, 20% mid left main, 30% ostial LAD, 50% proximal LAD, EF 55 to 65%.  Repeat echo obtained on the same day revealed LVEF of 60 to 65%, moderately reduced RV systolic function, grade 3 diastolic dysfunction, RSVP 46.1 mmHg, severe biatrial enlargement, mild MR, mild to moderate TR.    She was admitted 9/11-9/17/23 with acute sepsis. CT abdomen and pelvis in ED showed interval development of post ablation changes to left kidney with additional interval development of severe hydronephrosis with obstruction of the  proximal ureter.  She also had lactic acidosis and leukocytosis.  Cultures obtained.  She was started on vancomycin and Zosyn. Blood culture with Streptococcus gallolyticus in 1 out of 2 bottles.  Patient had left ureteral stent placement by urology on 9/13, and drain placed for left perinephric abscess by IR on 9/15.   She was admitted on Dec 13 with severe flank pain. Hgb down to 7.5. She has a history of renal cell mass status post nephrostomy tube, ureteral stent. CT renal study showed large left renal subcapsular hematoma extending into the perirenal space left pigtail ureteral stent in place, no hydronephrosis. Eliquis was held. Urology consulted and observation recommended.   On follow up today she is seen with her son. She denies any further bleeding or flank pain. She just has a single nephrostomy drain in place. No documented Afib. HR stable. She reports her BP is dropping at the end of dialysis. Is taking midodrine on dialysis days and sometimes has to take another. She feels really wiped out after dialysis. She is able to get up and around some at home. She is SOB a fair amount. Has been referred to hematology for her anemia.   Past Medical History:  Diagnosis Date   Anemia of chronic disease    takes iron   Anxiety    Blood transfusion without reported diagnosis    Bradycardia    Diabetes mellitus without complication (HCC)    became diabetic after Whipple procedure   Diarrhea    Dysrhythmia 04/16/2016   bradycardia  due to medication    ESRD on hemodialysis [facility])    M-W-F   GERD (gastroesophageal reflux disease)    Gout    Headache    History of kidney stones 06/2013   Hyperparathyroidism (HCC)    Hypertension    PONV (postoperative nausea and vomiting)    Sleep apnea    no cpap machine. could not tolerate   Stroke (HCC) 04/16/2016   TIA 1995   Vitamin D deficiency     Past Surgical History:  Procedure Laterality Date   A/V FISTULAGRAM Left 08/30/2021   Procedure: A/V  Fistulagram;  Surgeon: Cephus Shelling, MD;  Location: [facility] INVASIVE CV LAB;  Service: Cardiovascular;  Laterality: Left;   ABDOMINAL HYSTERECTOMY  1985   complete   BACK SURGERY  1980   lower   BASCILIC VEIN TRANSPOSITION Left 04/24/2017   Procedure: LEFT ARM FIRST STAGE BASILIC VEIN TRANSPOSITION;  Surgeon: Nada Libman, MD;  Location: [facility] OR;  Service: Vascular;  Laterality: Left;   BASCILIC VEIN TRANSPOSITION Left 07/10/2017   Procedure: SECOND STAGE BASILIC VEIN TRANSPOSITION LEFT ARM;  Surgeon: Nada Libman, MD;  Location: [facility] OR;  Service: Vascular;  Laterality: Left;   BREAST LUMPECTOMY Left x 2   many years apart, benign   CHOLECYSTECTOMY  1985   CYSTOSCOPY W/ URETERAL STENT PLACEMENT Left 10/31/2021   Procedure: CYSTOSCOPY WITH RETROGRADE PYELOGRAM/URETERAL STENT PLACEMENT;  Surgeon: Crist Fat, MD;  Location: [facility] OR;  Service: Urology;  Laterality: Left;   CYSTOSCOPY WITH URETEROSCOPY AND STENT PLACEMENT Left 06/18/2013   Procedure: CYSTOSCOPY WITH Lef URETEROSCOPY AND Left STENT PLACEMENT;  Surgeon: Crecencio Mc, MD;  Location: [facility] ORS;  Service: Urology;  Laterality: Left;   ESOPHAGOGASTRODUODENOSCOPY (EGD) WITH PROPOFOL N/A 11/13/2016   Procedure: ESOPHAGOGASTRODUODENOSCOPY (EGD) WITH PROPOFOL;  Surgeon: Kathi Der, MD;  Location: [facility] ENDOSCOPY;  Service: Gastroenterology;  Laterality: N/A;   EUS N/A 02/07/2016   Procedure: ESOPHAGEAL ENDOSCOPIC ULTRASOUND (EUS) RADIAL;  Surgeon: Willis Modena, MD;  Location: [facility] ENDOSCOPY;  Service: Endoscopy;  Laterality: N/A;   EYE SURGERY Bilateral 2014   ioc for catracts    FOOT SURGERY Left 1990   something with toes unsure what    HERNIA REPAIR  2018   IR EMBO TUMOR ORGAN ISCHEMIA INFARCT INC GUIDE ROADMAPPING  08/22/2021   IR RADIOLOGIST EVAL & MGMT  05/18/2021   IR RADIOLOGIST EVAL & MGMT  10/01/2021   IR RADIOLOGIST EVAL & MGMT  11/12/2021   IR RADIOLOGIST EVAL & MGMT  11/22/2021   IR RADIOLOGIST EVAL & MGMT   11/27/2021   IR RADIOLOGIST EVAL & MGMT  12/20/2021   IR RENAL SUPRASEL UNI S&I MOD SED  08/22/2021   IR US GUIDE VASC ACCESS RIGHT  08/22/2021   LEFT HEART CATH AND CORONARY ANGIOGRAPHY N/A 08/27/2021   Procedure: LEFT HEART CATH AND CORONARY ANGIOGRAPHY;  Surgeon: Kathleene Hazel, MD;  Location: [facility] INVASIVE CV LAB;  Service: Cardiovascular;  Laterality: N/A;   PARATHYROID EXPLORATION     PERIPHERAL VASCULAR BALLOON ANGIOPLASTY Left 08/30/2021   Procedure: PERIPHERAL VASCULAR BALLOON ANGIOPLASTY;  Surgeon: Cephus Shelling, MD;  Location: [facility] INVASIVE CV LAB;  Service: Cardiovascular;  Laterality: Left;  arm fistula   RADIOLOGY WITH ANESTHESIA Left 08/22/2021   Procedure: MICROWAVE ABLATION;  Surgeon: Simonne Come, MD;  Location: [facility] ORS;  Service: Anesthesiology;  Laterality: Left;   WHIPPLE PROCEDURE N/A 04/23/2016   Procedure: WHIPPLE PROCEDURE;  Surgeon: Almond Lint, MD;  Location: [facility] OR;  Service:  General;  Laterality: N/A;     Current Outpatient Medications  Medication Sig Dispense Refill   acetaminophen (TYLENOL) 325 MG tablet Take 2 tablets (650 mg total) by mouth every 6 (six) hours as needed for mild pain, fever or headache.     amiodarone (PACERONE) 200 MG tablet Take 0.5 tablets (100 mg total) by mouth daily. Please keep appointment for further refills 90 tablet 3   carvedilol (COREG) 3.125 MG tablet Take 1 tablet (3.125 mg total) by mouth 2 (two) times daily with a meal. Take on the days you do not have dialysis (Patient taking differently: Take 3.125 mg by mouth 2 (two) times daily with a meal. Take on the days you do not have dialysis( Sundays, Tuesdays, Thursdays, and Saturdays)) 60 tablet 1   Cholecalciferol (VITAMIN D-3 PO) Take 1 capsule by mouth every Monday, Wednesday, and Friday with hemodialysis.     colestipol (COLESTID) 1 g tablet Take 1 g by mouth See admin instructions. 1 tablet (1g) prior to dialysis M-W-F + 1 tablet daily PRN diarrhea     diclofenac sodium  (VOLTAREN) 1 % GEL Apply 2 g topically 4 (four) times daily as needed (for hand pain).     ferrous sulfate 325 (65 FE) MG tablet Take 1 tablet (325 mg total) by mouth daily with breakfast.  3   folic acid (FOLVITE) 1 MG tablet Take 1 tablet (1 mg total) by mouth daily.     hydrALAZINE (APRESOLINE) 50 MG tablet Take 1 tablet (50 mg total) by mouth 3 (three) times daily. Take on non dialysis days     insulin aspart (NOVOLOG) 100 UNIT/ML injection Inject 0-6 Units into the skin 3 (three) times daily with meals. 10 mL 11   lidocaine-prilocaine (EMLA) cream Apply 1 Application topically every Monday, Wednesday, and Friday with hemodialysis.     lipase/protease/amylase (CREON) 36000 UNITS CPEP capsule Take 36,000 Units by mouth 2 (two) times daily with a meal.     olopatadine (PATADAY) 0.1 % ophthalmic solution Place 1 drop into both eyes 2 (two) times daily.     pantoprazole (PROTONIX) 40 MG tablet Take 1 tablet (40 mg total) by mouth daily. 30 tablet 0   RESTASIS 0.05 % ophthalmic emulsion 1 drop 2 (two) times daily.     rOPINIRole (REQUIP) 0.25 MG tablet Take 0.25 mg by mouth at bedtime.     senna (SENOKOT) 8.6 MG TABS tablet Take 1 tablet (8.6 mg total) by mouth at bedtime. 120 tablet 0   sevelamer carbonate (RENVELA) 800 MG tablet Take 1,600 mg by mouth 3 (three) times daily with meals.     sodium chloride flush (NS) 0.9 % SOLN 5 mLs by Intracatheter route every 8 (eight) hours.     traMADol (ULTRAM) 50 MG tablet Take 1 tablet (50 mg total) by mouth every 12 (twelve) hours as needed for moderate pain or severe pain. 10 tablet 0   XIIDRA 5 % SOLN Place 1 drop into both eyes at bedtime.     No current facility-administered medications for this visit.    Allergies:   Lopid [gemfibrozil], Lotemax [loteprednol etabonate], Statins, Norco [hydrocodone-acetaminophen], Other, Tomato, Vibra-tab [doxycycline], Amlodipine, Codeine, Hydrocodone, Hydrocodone-acetaminophen, Lisinopril, and Verapamil    Social  History:  The patient  reports that she has never smoked. She has never used smokeless tobacco. She reports that she does not drink alcohol and does not use drugs.   Family History:  The patient's family history includes Alzheimer's disease in her sister and  sister; Cancer in her brother and father; Heart disease in her mother; Stroke in her mother.    ROS:  Please see the history of present illness.   Otherwise, review of systems are positive for none.   All other systems are reviewed and negative.    PHYSICAL EXAM: VS:  BP 116/62   Pulse 98   Ht 5\' 3"  (1.6 m)   Wt 132 lb 12.8 oz (60.2 kg)   SpO2 99%   BMI 23.52 kg/m  , BMI Body mass index is 23.52 kg/m. GEN: chronically ill appearing, in no acute distress.  HEENT: normal Neck: no JVD, carotid bruits, or masses Cardiac: RRR; radiated murmur from her AV fistula. no murmurs, rubs, or gallops,no edema. Functioning AV fistula in left upper arm. 1-2+ LE edema Respiratory:  clear to auscultation bilaterally, normal work of breathing GI: soft, nontender, nondistended, + BS MS: no deformity or atrophy Skin: warm and dry, no rash Neuro:  Strength and sensation are intact Psych: euthymic mood, full affect   EKG:  EKG is not ordered today.   Recent Labs: 09/05/2021: B Natriuretic Peptide 2,213.9 10/25/2021: TSH 1.480 01/22/2022: ALT 47; Magnesium 2.8 01/30/2022: BUN 19; Creatinine, Ser 4.55; Hemoglobin 9.8; Platelets 431; Potassium 4.0; Sodium 131    Lipid Panel    Component Value Date/Time   CHOL 95 08/26/2021 0447   CHOL 133 08/09/2021 1143   TRIG 110 08/26/2021 0447   HDL 35 (L) 08/26/2021 0447   HDL 43 08/09/2021 1143   CHOLHDL 2.7 08/26/2021 0447   VLDL 22 08/26/2021 0447   LDLCALC 38 08/26/2021 0447   LDLCALC 68 08/09/2021 1143      Wt Readings from Last 3 Encounters:  02/25/22 132 lb 12.8 oz (60.2 kg)  01/30/22 122 lb 5.7 oz (55.5 kg)  11/23/21 129 lb 9.6 oz (58.8 kg)      Other studies Reviewed: Additional  studies/ records that were reviewed today include:   Echo 06/19/13: Study Conclusions   - Left ventricle: The cavity size was normal. There was    severe focal basal and moderate concentric hypertrophy.    Systolic function was normal. The estimated ejection    fraction was in the range of 60% to 65%. Wall motion was    normal; there were no regional wall motion abnormalities.  - Aortic valve: Valve area: 1.69cm^2 (Vmax).  - Mitral valve: Mild to moderate regurgitation.  - Left atrium: The atrium was mildly dilated.  - Atrial septum: There was increased thickness of the    septum, consistent with lipomatous hypertrophy.   Echo 08/27/21: IMPRESSIONS     1. Right ventricular systolic function is moderately reduced. The right  ventricular size is mildly enlarged. There is moderately elevated  pulmonary artery systolic pressure. The estimated right ventricular  systolic pressure is 46.1 mmHg. Normal apical  function with free wall hypokinesis, consistent with McConnell's sign as  can be seen in acute PE (image 58). Consider evaluation for PE.   2. Left ventricular ejection fraction, by estimation, is 60 to 65%. The  left ventricle has normal function. The left ventricle has no regional  wall motion abnormalities. Left ventricular diastolic parameters are  consistent with Grade III diastolic  dysfunction (restrictive). Elevated left atrial pressure.   3. Left atrial size was severely dilated.   4. Right atrial size was severely dilated.   5. The mitral valve is degenerative. Mild mitral valve regurgitation. No  evidence of mitral stenosis. Moderate mitral annular calcification.  6. Tricuspid valve regurgitation is mild to moderate.   7. The aortic valve is tricuspid. Aortic valve regurgitation is not  visualized. Aortic valve sclerosis is present, with no evidence of aortic  valve stenosis.   8. The inferior vena cava is dilated in size with <50% respiratory  variability, suggesting  right atrial pressure of 15 mmHg.   Cardiac cath 08/27/21:  LEFT HEART CATH AND CORONARY ANGIOGRAPHY   Conclusion      Prox RCA lesion is 20% stenosed.   Mid LM to Dist LM lesion is 20% stenosed.   Ost LAD lesion is 30% stenosed.   Mid Cx lesion is 20% stenosed.   Prox LAD lesion is 50% stenosed.   The left ventricular systolic function is normal.   LV end diastolic pressure is normal.   The left ventricular ejection fraction is 55-65% by visual estimate.   There is no mitral valve regurgitation.   Mild non-obstructive CAD Normal LV systolic function Elevated troponin and chest pain is likely due to demand ischemic in the setting of rapid atrial fibrillation.    Recommendation: Medical management of CAD.   Coronary Diagrams  Diagnostic Dominance: Right  Interve  ASSESSMENT AND PLAN:  1.  ESRD on hemodialysis. Volume status appears elevated today with edema. Managed with dialysis.  2. Orthostatic hypotension. Now again requiring midodrine on dialysis days.  Recommend reducing hydralazine on nondialysis days to 25 mg tid. Will continue to monitor. 3. S/p subscapsular renal hematoma- on eliquis- Eliquis was stopped 4. DM 5. History of HTN 6. Renal tumor s/p ablation 7. Recent sepsis with hydronephrosis and abscess. S/p drain- chronic.  8. Nonobstructive CAD 9. Paroxysmal Afib following ablation procedure in July 2023.  Converted on amiodarone. No recurrence. Appears to be in NSR today.   Given recent major bleeding episode we discussed options around stroke risk reduction. She does have a high [place] Vasc score of 7. She also has a high bleeding risk score HAS bled 5. We have decided  to forego anticoagulation and continue to try and maintain NSR with amiodarone as a shared decision.        Disposition:   FU  3 months   Signed, Davionne Dowty [place], MD  02/25/2022 2:10 PM    [facility] [address], [place], [place], [address] Phone [phone], Fax  [phone]

## 2022-06-05 ENCOUNTER — Other Ambulatory Visit: Payer: Self-pay | Admitting: Urology

## 2022-06-05 ENCOUNTER — Other Ambulatory Visit: Payer: Self-pay | Admitting: Interventional Radiology

## 2022-06-05 DIAGNOSIS — N2889 Other specified disorders of kidney and ureter: Secondary | ICD-10-CM

## 2022-06-05 DIAGNOSIS — Z992 Dependence on renal dialysis: Secondary | ICD-10-CM | POA: Diagnosis not present

## 2022-06-05 DIAGNOSIS — N186 End stage renal disease: Secondary | ICD-10-CM | POA: Diagnosis not present

## 2022-06-05 DIAGNOSIS — N2581 Secondary hyperparathyroidism of renal origin: Secondary | ICD-10-CM | POA: Diagnosis not present

## 2022-06-06 ENCOUNTER — Encounter: Payer: Self-pay | Admitting: Cardiology

## 2022-06-06 ENCOUNTER — Ambulatory Visit: Payer: Medicare PPO | Attending: Cardiology | Admitting: Cardiology

## 2022-06-06 VITALS — BP 120/64 | HR 73 | Ht 63.0 in | Wt 130.6 lb

## 2022-06-06 DIAGNOSIS — N186 End stage renal disease: Secondary | ICD-10-CM | POA: Diagnosis not present

## 2022-06-06 DIAGNOSIS — I480 Paroxysmal atrial fibrillation: Secondary | ICD-10-CM | POA: Diagnosis not present

## 2022-06-06 DIAGNOSIS — D631 Anemia in chronic kidney disease: Secondary | ICD-10-CM | POA: Diagnosis not present

## 2022-06-06 DIAGNOSIS — Z992 Dependence on renal dialysis: Secondary | ICD-10-CM

## 2022-06-06 DIAGNOSIS — I951 Orthostatic hypotension: Secondary | ICD-10-CM | POA: Diagnosis not present

## 2022-06-06 MED ORDER — HYDRALAZINE HCL 50 MG PO TABS: 25.0000 mg | ORAL_TABLET | Freq: Three times a day (TID) | ORAL | 3 refills | Status: DC

## 2022-06-06 NOTE — Patient Instructions (Signed)
Medication Instructions:  Decrease Hydralazine to 25 mg three times a day Continue all other medications *If you need a refill on your cardiac medications before your next appointment, please call your pharmacy*   Lab Work: None ordered   Testing/Procedures: None ordered   Follow-Up: At [facility], you and your health needs are our priority.  As part of our continuing mission to provide you with exceptional heart care, we have created designated Provider Care Teams.  These Care Teams include your primary Cardiologist (physician) and Advanced Practice Providers (APPs -  Physician Assistants and Nurse Practitioners) who all work together to provide you with the care you need, when you need it.  We recommend signing up for the patient portal called "MyChart".  Sign up information is provided on this After Visit Summary.  MyChart is used to connect with patients for Virtual Visits (Telemedicine).  Patients are able to view lab/test results, encounter notes, upcoming appointments, etc.  Non-urgent messages can be sent to your provider as well.   To learn more about what you can do with MyChart, go to [email or web address].    Your next appointment:  3 months    Provider:  Dr.Jordan

## 2022-06-07 DIAGNOSIS — N2581 Secondary hyperparathyroidism of renal origin: Secondary | ICD-10-CM | POA: Diagnosis not present

## 2022-06-07 DIAGNOSIS — N186 End stage renal disease: Secondary | ICD-10-CM | POA: Diagnosis not present

## 2022-06-07 DIAGNOSIS — Z992 Dependence on renal dialysis: Secondary | ICD-10-CM | POA: Diagnosis not present

## 2022-06-10 DIAGNOSIS — Z992 Dependence on renal dialysis: Secondary | ICD-10-CM | POA: Diagnosis not present

## 2022-06-10 DIAGNOSIS — N2581 Secondary hyperparathyroidism of renal origin: Secondary | ICD-10-CM | POA: Diagnosis not present

## 2022-06-10 DIAGNOSIS — N186 End stage renal disease: Secondary | ICD-10-CM | POA: Diagnosis not present

## 2022-06-11 ENCOUNTER — Telehealth: Payer: Self-pay | Admitting: Cardiology

## 2022-06-11 NOTE — Telephone Encounter (Signed)
   Pt's son calling, would like to get recommendations on what BP range consider as an ok BP. He said, pt's BP is still elevated butt when she get her dialysis her BP drops. He said, pt is still no symptom and feeling fine. Wants to discuss with Dr. Elvis Coil nurse

## 2022-06-11 NOTE — Telephone Encounter (Signed)
Spoke to patient's son [name].Stated he wanted Dr.Jordan to know what patient's B/P has been since he decreased Hydralazine to 25 mg three times a day on non dialysis days.B/P ranging 161/106,167/108,162/105,147/83,165/111,107/79163/106,151/89.Pulse-90,90,83,88,89,113,86. Stated B/P did not drop at dialysis yesterday.He wanted to ask when is B/P too high and too low.Advised Dr.Jordan is out of office this week.I will send message to him for advice.

## 2022-06-12 DIAGNOSIS — N186 End stage renal disease: Secondary | ICD-10-CM | POA: Diagnosis not present

## 2022-06-12 DIAGNOSIS — Z992 Dependence on renal dialysis: Secondary | ICD-10-CM | POA: Diagnosis not present

## 2022-06-12 DIAGNOSIS — N2581 Secondary hyperparathyroidism of renal origin: Secondary | ICD-10-CM | POA: Diagnosis not present

## 2022-06-13 NOTE — Telephone Encounter (Signed)
Patient is calling back to speak to you. Please advise  

## 2022-06-13 NOTE — Telephone Encounter (Signed)
Spoke to patient's son [name] he stated mother's B/P this morning before medications 163/107 pulse 95.Stated her B/P did not drop yesterday at dialysis.He is concerned B/P elevated.After she got home from dialysis yesterday B/P 158/102,174/91,160/85,150/98.Pulse [id number].Dr.Jordan is out of office this week.I will send message to him.

## 2022-06-14 DIAGNOSIS — Z992 Dependence on renal dialysis: Secondary | ICD-10-CM | POA: Diagnosis not present

## 2022-06-14 DIAGNOSIS — N186 End stage renal disease: Secondary | ICD-10-CM | POA: Diagnosis not present

## 2022-06-14 DIAGNOSIS — N2581 Secondary hyperparathyroidism of renal origin: Secondary | ICD-10-CM | POA: Diagnosis not present

## 2022-06-17 DIAGNOSIS — Z992 Dependence on renal dialysis: Secondary | ICD-10-CM | POA: Diagnosis not present

## 2022-06-17 DIAGNOSIS — N2581 Secondary hyperparathyroidism of renal origin: Secondary | ICD-10-CM | POA: Diagnosis not present

## 2022-06-17 DIAGNOSIS — N186 End stage renal disease: Secondary | ICD-10-CM | POA: Diagnosis not present

## 2022-06-18 DIAGNOSIS — I158 Other secondary hypertension: Secondary | ICD-10-CM | POA: Diagnosis not present

## 2022-06-18 DIAGNOSIS — N186 End stage renal disease: Secondary | ICD-10-CM | POA: Diagnosis not present

## 2022-06-18 DIAGNOSIS — Z992 Dependence on renal dialysis: Secondary | ICD-10-CM | POA: Diagnosis not present

## 2022-06-18 NOTE — Telephone Encounter (Signed)
Patient's son is calling to get update on this matter. Requesting call back.

## 2022-06-18 NOTE — Telephone Encounter (Signed)
Son is aware of provider message. Verbalized understanding.

## 2022-06-19 DIAGNOSIS — N2581 Secondary hyperparathyroidism of renal origin: Secondary | ICD-10-CM | POA: Diagnosis not present

## 2022-06-19 DIAGNOSIS — N186 End stage renal disease: Secondary | ICD-10-CM | POA: Diagnosis not present

## 2022-06-19 DIAGNOSIS — L299 Pruritus, unspecified: Secondary | ICD-10-CM | POA: Diagnosis not present

## 2022-06-19 DIAGNOSIS — Z992 Dependence on renal dialysis: Secondary | ICD-10-CM | POA: Diagnosis not present

## 2022-06-20 ENCOUNTER — Ambulatory Visit
Admission: RE | Admit: 2022-06-20 | Discharge: 2022-06-20 | Disposition: A | Discharge status: Home / Self Care | Payer: Medicare PPO | Source: Ambulatory Visit | Attending: Urology | Admitting: Urology

## 2022-06-20 ENCOUNTER — Other Ambulatory Visit: Payer: Self-pay | Admitting: Student

## 2022-06-20 DIAGNOSIS — C3491 Malignant neoplasm of unspecified part of right bronchus or lung: Secondary | ICD-10-CM | POA: Diagnosis not present

## 2022-06-20 DIAGNOSIS — N2889 Other specified disorders of kidney and ureter: Secondary | ICD-10-CM

## 2022-06-20 MED ORDER — LEVOFLOXACIN 750 MG PO TABS: 750.0000 mg | ORAL_TABLET | Freq: Every day | ORAL | 0 refills | Status: AC

## 2022-06-20 MED ORDER — IOPAMIDOL (ISOVUE-370) INJECTION 76%
10.0000 mL | Freq: Once | INTRAVENOUS | Status: AC | PRN
  Administered 2022-06-20: 10 mL

## 2022-06-20 MED ORDER — IOPAMIDOL (ISOVUE-300) INJECTION 61%
100.0000 mL | Freq: Once | INTRAVENOUS | Status: AC | PRN
  Administered 2022-06-20: 100 mL via INTRAVENOUS

## 2022-06-20 NOTE — Progress Notes (Signed)
Patient ID: Kim Burns, female   DOB: 11-27-1935, 87 y.o.   MRN: [id number]        Chief Complaint: Urine leak following renal cryoablation and embolization.  Referring Physician(s): Herrick,Benjamin W  History of Present Illness: Kim Burns is a 87 y.o. female with history of left-sided renal cell carcinoma, post endovascular embolization and microwave ablation on 08/22/2021.   Postprocedural course complicated by development of a delayed urine leak ultimately managed both with a double-J ureteral stent as well as a perinephric drainage catheter and ultimately with a nephrostomy catheter.  The patient has subsequently undergone perinephric drainage catheter removal as well as removal of the double-J ureteral stent.   The patient returns to the interventional radiology drain clinic for drainage catheter evaluation and management.  The patient's left-sided percutaneous nephrostomy catheter has been capped since removal of the left-sided double-J ureteral stent 2 weeks ago.   The patient reports general malaise and a consistent headache, worse during the past several days.    Past Medical History:  Diagnosis Date   Anemia of chronic disease    takes iron   Anxiety    Blood transfusion without reported diagnosis    Bradycardia    Diabetes mellitus without complication (HCC)    became diabetic after Whipple procedure   Diarrhea    Dysrhythmia 04/16/2016   bradycardia due to medication    ESRD on hemodialysis [facility])    M-W-F   GERD (gastroesophageal reflux disease)    Gout    Headache    History of kidney stones 06/2013   Hyperparathyroidism (HCC)    Hypertension    PONV (postoperative nausea and vomiting)    Sleep apnea    no cpap machine. could not tolerate   Stroke (HCC) 04/16/2016   TIA 1995   Vitamin D deficiency     Past Surgical History:  Procedure Laterality Date   A/V FISTULAGRAM Left 08/30/2021   Procedure: A/V Fistulagram;  Surgeon: Cephus Shelling, MD;  Location: [facility] INVASIVE CV LAB;  Service: Cardiovascular;  Laterality: Left;   ABDOMINAL HYSTERECTOMY  1985   complete   BACK SURGERY  1980   lower   BASCILIC VEIN TRANSPOSITION Left 04/24/2017   Procedure: LEFT ARM FIRST STAGE BASILIC VEIN TRANSPOSITION;  Surgeon: Nada Libman, MD;  Location: [facility] OR;  Service: Vascular;  Laterality: Left;   BASCILIC VEIN TRANSPOSITION Left 07/10/2017   Procedure: SECOND STAGE BASILIC VEIN TRANSPOSITION LEFT ARM;  Surgeon: Nada Libman, MD;  Location: [facility] OR;  Service: Vascular;  Laterality: Left;   BREAST LUMPECTOMY Left x 2   many years apart, benign   CHOLECYSTECTOMY  1985   CYSTOSCOPY W/ URETERAL STENT PLACEMENT Left 10/31/2021   Procedure: CYSTOSCOPY WITH RETROGRADE PYELOGRAM/URETERAL STENT PLACEMENT;  Surgeon: Crist Fat, MD;  Location: [facility] OR;  Service: Urology;  Laterality: Left;   CYSTOSCOPY WITH URETEROSCOPY AND STENT PLACEMENT Left 06/18/2013   Procedure: CYSTOSCOPY WITH Lef URETEROSCOPY AND Left STENT PLACEMENT;  Surgeon: Crecencio Mc, MD;  Location: [facility] ORS;  Service: Urology;  Laterality: Left;   ESOPHAGOGASTRODUODENOSCOPY (EGD) WITH PROPOFOL N/A 11/13/2016   Procedure: ESOPHAGOGASTRODUODENOSCOPY (EGD) WITH PROPOFOL;  Surgeon: Kathi Der, MD;  Location: [facility] ENDOSCOPY;  Service: Gastroenterology;  Laterality: N/A;   EUS N/A 02/07/2016   Procedure: ESOPHAGEAL ENDOSCOPIC ULTRASOUND (EUS) RADIAL;  Surgeon: Willis Modena, MD;  Location: [facility] ENDOSCOPY;  Service: Endoscopy;  Laterality: N/A;   EYE SURGERY Bilateral 2014   ioc for catracts    FOOT  SURGERY Left 1990   something with toes unsure what    HERNIA REPAIR  2018   IR CATHETER TUBE CHANGE  03/14/2022   IR EMBO TUMOR ORGAN ISCHEMIA INFARCT INC GUIDE ROADMAPPING  08/22/2021   IR NEPHROSTOMY PLACEMENT LEFT  03/14/2022   IR RADIOLOGIST EVAL & MGMT  05/18/2021   IR RADIOLOGIST EVAL & MGMT  10/01/2021   IR RADIOLOGIST EVAL & MGMT  11/12/2021   IR RADIOLOGIST EVAL &  MGMT  11/22/2021   IR RADIOLOGIST EVAL & MGMT  11/27/2021   IR RADIOLOGIST EVAL & MGMT  12/20/2021   IR RENAL SUPRASEL UNI S&I MOD SED  08/22/2021   IR US GUIDE VASC ACCESS RIGHT  08/22/2021   LEFT HEART CATH AND CORONARY ANGIOGRAPHY N/A 08/27/2021   Procedure: LEFT HEART CATH AND CORONARY ANGIOGRAPHY;  Surgeon: Kathleene Hazel, MD;  Location: [facility] INVASIVE CV LAB;  Service: Cardiovascular;  Laterality: N/A;   PARATHYROID EXPLORATION     PERIPHERAL VASCULAR BALLOON ANGIOPLASTY Left 08/30/2021   Procedure: PERIPHERAL VASCULAR BALLOON ANGIOPLASTY;  Surgeon: Cephus Shelling, MD;  Location: [facility] INVASIVE CV LAB;  Service: Cardiovascular;  Laterality: Left;  arm fistula   RADIOLOGY WITH ANESTHESIA Left 08/22/2021   Procedure: MICROWAVE ABLATION;  Surgeon: Simonne Come, MD;  Location: [facility] ORS;  Service: Anesthesiology;  Laterality: Left;   WHIPPLE PROCEDURE N/A 04/23/2016   Procedure: WHIPPLE PROCEDURE;  Surgeon: Almond Lint, MD;  Location: [facility] OR;  Service: General;  Laterality: N/A;    Allergies: Lopid [gemfibrozil], Lotemax [loteprednol etabonate], Statins, Norco [hydrocodone-acetaminophen], Other, Tomato, Vibra-tab [doxycycline], Amlodipine, Codeine, Hydrocodone, Hydrocodone-acetaminophen, Lisinopril, and Verapamil  Medications: Prior to Admission medications   Medication Sig Start Date End Date Taking? Authorizing Provider  levofloxacin (LEVAQUIN) 750 MG tablet Take 1 tablet (750 mg total) by mouth daily for 5 days. 06/20/22 06/25/22 Yes Kennieth Francois, PA  acetaminophen (TYLENOL) 325 MG tablet Take 2 tablets (650 mg total) by mouth every 6 (six) hours as needed for mild pain, fever or headache. 01/30/22   Elgergawy, Leana Roe, MD  amiodarone (PACERONE) 200 MG tablet Take 0.5 tablets (100 mg total) by mouth daily. Please keep appointment for further refills 11/23/21   [place], Peter M, MD  B Complex-C-Zn-Folic Acid (DIALYVITE 800 WITH ZINC) 0.8 MG TABS Take 1 tablet by mouth daily.    [provider]  carvedilol (COREG) 3.125 MG tablet Take 1 tablet (3.125 mg total) by mouth 2 (two) times daily with a meal. Take on the days you do not have dialysis 05/24/22   [place], Peter M, MD  Cholecalciferol (VITAMIN D-3 PO) Take 1 capsule by mouth every Monday, Wednesday, and Friday with hemodialysis.    [provider]  colestipol (COLESTID) 1 g tablet Take 1 g by mouth See admin instructions. 1 tablet (1g) prior to dialysis M-W-F + 1 tablet daily PRN diarrhea    [provider]  diclofenac sodium (VOLTAREN) 1 % GEL Apply 2 g topically 4 (four) times daily as needed (for hand pain).    [provider]  ferrous sulfate 325 (65 FE) MG tablet Take 1 tablet (325 mg total) by mouth daily with breakfast. 01/30/22   Elgergawy, Leana Roe, MD  folic acid (FOLVITE) 1 MG tablet Take 1 tablet (1 mg total) by mouth daily. 01/31/22   Elgergawy, Leana Roe, MD  hydrALAZINE (APRESOLINE) 50 MG tablet Take 0.5 tablets (25 mg total) by mouth 3 (three) times daily. Take on non dialysis days 06/06/22   [place], Peter  M, MD  insulin aspart (NOVOLOG) 100 UNIT/ML injection Inject 0-6 Units into the skin 3 (three) times daily with meals. 01/30/22   Elgergawy, Leana Roe, MD  lidocaine-prilocaine (EMLA) cream Apply 1 Application topically every Monday, Wednesday, and Friday with hemodialysis.    [provider]  lipase/protease/amylase (CREON) 36000 UNITS CPEP capsule Take 36,000 Units by mouth 2 (two) times daily with a meal.    [provider]  midodrine (PROAMATINE) 10 MG tablet Take 10 mg on dialysis days Mon-Wed-Fri 06/13/22   [place], Peter M, MD  olopatadine (PATADAY) 0.1 % ophthalmic solution Place 1 drop into both eyes 2 (two) times daily.    [provider]  pantoprazole (PROTONIX) 40 MG tablet Take 1 tablet (40 mg total) by mouth daily. 01/31/22   Elgergawy, Leana Roe, MD  RESTASIS 0.05 % ophthalmic emulsion 1 drop 2 (two) times daily. 11/06/21   [provider]  rOPINIRole (REQUIP) 0.25 MG tablet Take 0.25 mg by mouth at bedtime. 12/11/21   [provider]  SENSIPAR 30 MG tablet Take by mouth. 12/18/12   [provider]  sevelamer carbonate (RENVELA) 800 MG tablet Take 1,600 mg by mouth 3 (three) times daily with meals.    [provider]  XIIDRA 5 % SOLN Place 1 drop into both eyes at bedtime. 09/24/21   [provider]     Family History  Problem Relation Age of Onset   Heart disease Mother    Stroke Mother    Cancer Father        colon   Alzheimer's disease Sister    Alzheimer's disease Sister    Cancer Brother        brain   Breast cancer Neg Hx     Social History   Socioeconomic History   Marital status: Widowed    Spouse name: Not on file   Number of children: Not on file   Years of education: Not on file   Highest education level: Not on file  Occupational History   Not on file  Tobacco Use   Smoking status: Never   Smokeless tobacco: Never  Vaping Use   Vaping Use: Never used  Substance and Sexual Activity   Alcohol use: No   Drug use: No   Sexual activity: Not on file  Other Topics Concern   Not on file  Social History Narrative   Not on file   Social Determinants of Health   Financial Resource Strain: Not on file  Food Insecurity: No Food Insecurity (11/01/2021)   Hunger Vital Sign    Worried About Running Out of Food in the Last Year: Never true    Ran Out of Food in the Last Year: Never true  Transportation Needs: No Transportation Needs (11/01/2021)   PRAPARE - Administrator, Civil Service (Medical): No    Lack of Transportation (Non-Medical): No  Physical Activity: Not on file  Stress: Not on file  Social Connections: Not on file    ECOG Status: 2 - Symptomatic, <50% confined to bed  Review of Systems: A 12 point ROS discussed and pertinent positives are indicated in the HPI above.  All other systems are negative.  Review of Systems  Vital  Signs: There were no vitals taken for this visit.  Physical Exam  Mallampati Score:     Imaging: CT ABDOMEN PELVIS W WO CONTRAST  Result Date: 06/20/2022 CLINICAL DATA:  History of left-sided renal cell carcinoma, post endovascular embolization  and microwave ablation on 08/22/2021. Postprocedural course complicated by development of a delayed urine leak ultimately managed both with a double-J ureteral stent as well as a perinephric drainage catheter and ultimately with a nephrostomy catheter. Patient has subsequently undergone perinephric drainage catheter removal as well as removal of the double-J ureteral stent. Patient returns to the interventional radiology drain clinic for drainage catheter evaluation and management. The patient's left-sided percutaneous nephrostomy catheter has been capped since removal of the left-sided double-J ureteral stent 2 weeks ago. Patient reports general malaise and a consistent headache, worse during the past several days. EXAM: CT ABDOMEN AND PELVIS WITHOUT AND WITH CONTRAST TECHNIQUE: Multidetector CT imaging of the abdomen and pelvis was performed following the standard protocol before and following the bolus administration of intravenous contrast. RADIATION DOSE REDUCTION: This exam was performed according to the departmental dose-optimization program which includes automated exposure control, adjustment of the mA and/or kV according to patient size and/or use of iterative reconstruction technique. CONTRAST:  ISOVUE-300 IOPAMIDOL (ISOVUE-300) INJECTION 61% COMPARISON:  CT abdomen and pelvis - 03/22/2022; 02/02/2022; 05/03/2021 Multiple previous fluoroscopic guided drainage catheter injections, most recently on 05/17/2022 FINDINGS: Lower chest: Limited visualization of the lower thorax demonstrates advanced emphysematous change within the imaged lung bases. Small mesenteric fat containing left-sided diaphragmatic hernia. Cardiomegaly. Pericardial effusion.  Hepatobiliary: Normal hepatic contour. No discrete hepatic lesions. Postcholecystectomy. No intra or extrahepatic biliary ductal dilatation. No ascites. Pancreas: Stable sequela of previous Whipple procedure. Spleen: Punctate subcentimeter hypoattenuating splenic lesions, unchanged, potentially representative of hepatic cysts. Adrenals/Urinary Tract: There are innumerable hypoattenuating renal lesion seen bilaterally compatible with known history of polycystic kidney disease and end-stage renal disease. Redemonstrated right-sided cortical calcifications versus nonobstructing nephrolithiasis. Stable sequela of previous embolization and ablation of previously noted enhancing left-sided renal mass without evidence of locally recurrent or residual disease, though note, evaluation degraded secondary to extensive, though note, evaluation degraded secondary to extensive postprocedural change affecting the left kidney postprocedural change affecting the left kidney. No new definitive definable/drainable renal lesions. Interval removal of left-sided double-J ureteral stent as well as left-sided perinephric drainage catheter with remaining percutaneous nephrostomy catheter coiled and locked within the accessed renal calyx. Progressive now severe left-sided pelvicaliectasis, now with development of an approximately 4.1 x 2.6 x 3.9 cm fluid collection/urinoma within the superior aspect of the left iliopsoas musculature (axial image 78, series 11; coronal image 88, series 13 close), adjacent to known proximal ureteral leak. Interval reduction in size of previously noted perinephric hematoma with dominant evolving hematoma now measuring 4.9 x 5.2 x 2.0 cm (coronal image 97, series 13; axial image 52, series 11), previously, 11.2 x 8.8 x 4.8 cm. Stomach/Bowel: Stable sequela of previous Whipple procedure without evidence of enteric obstruction. Moderate colonic stool burden. No evidence of enteric obstruction. Normal appearance of  the terminal ileum. Post appendectomy. No significant hiatal hernia. No pneumoperitoneum, pneumatosis or portal venous gas. Vascular/Lymphatic: Moderate-to-large amount of eccentric predominantly calcified atherosclerotic plaque throughout the abdominal aorta, not resulting in a hemodynamically significant stenosis. Scattered retroperitoneal lymph nodes are mildly prominent though individually not enlarged by size criteria with index left periaortic lymph node measuring 0.8 cm in greatest short axis diameter (image 82, series 11). No bulky retroperitoneal, mesenteric, pelvic or inguinal lymphadenopathy. Reproductive: Post hysterectomy. No discrete adnexal lesions. Multiple phleboliths are seen with the lower pelvis bilaterally. Other: Diffuse body wall anasarca. Note is made of several tiny midline mesenteric fat containing ventral wall hernias. Musculoskeletal: No acute or aggressive osseous abnormalities. Moderate multilevel  lumbar spine DDD, worse at L2-L3, L3-L4 and L4-L5 with disc space height loss, endplate irregularity and sclerosis. Moderate to severe degenerative change of the bilateral hips with joint space loss, subchondral sclerosis and osteophytosis, right-greater-than-left. IMPRESSION: 1. 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 2. Stable positioning of left-sided percutaneous nephrostomy catheter with end coiled and locked within the accessed renal calyx. 3. Post left-sided renal ablation and embolization without evidence of locally recurrent or residual disease. 4. Interval decrease in size of known perinephric hematoma, currently measuring 4.9 cm, previously, 11.2 cm. 5. Stable sequela of polycystic kidney disease and end-stage renal disease without discrete worrisome renal lesion. 6. Stable  sequela of previous Whipple without evidence of enteric obstruction. 7. Aortic Atherosclerosis (ICD10-I70.0) and Emphysema (ICD10-J43.9). PLAN: Patient subsequently underwent percutaneous drainage catheter injection. Electronically Signed   By: Simonne Come M.D.   On: 06/20/2022 14:33    Labs:  CBC: Recent Labs    01/26/22 0230 01/28/22 0318 01/30/22 0501 03/14/22 1027  WBC 8.9 9.1 10.4 6.9  HGB 8.3* 9.1* 9.8* 11.0*  HCT 25.0* 28.1* 29.7* 35.1*  PLT 241 345 431* 218    COAGS: Recent Labs    10/29/21 1709 11/01/21 0630 01/22/22 0038 03/14/22 1027  INR 1.4* 1.2 1.4* 1.0  APTT  --   --  26  --     BMP: Recent Labs    01/26/22 0230 01/28/22 0318 01/30/22 1342 03/14/22 1027  NA 134* 131* 131* 141  K 3.6 3.8 4.0 4.0  CL 95* 93* 93* 100  CO2 26 26 26 29   GLUCOSE 142* 141* 160* 120*  BUN 25* 21 19 30*  CALCIUM 8.4* 8.6* 8.5* 8.2*  CREATININE 4.42* 4.42* 4.55* 4.17*  GFRNONAA 9* 9* 9* 10*    LIVER FUNCTION TESTS: Recent Labs    10/30/21 0226 10/31/21 0748 01/21/22 2305 01/22/22 0506 01/28/22 0318 01/30/22 1342 02/25/22 1426  BILITOT 0.7  --  0.4 0.4  --   --  0.3  AST 15  --  37 28  --   --  30  ALT 16  --  52* 47*  --   --  36*  ALKPHOS 62  --  55 45  --   --  110  PROT 6.3*  --  5.6* 5.2*  --   --  6.1  ALBUMIN 2.2*   < > 2.4* 2.3* 2.2* 2.3* 3.3*   < > = values in this interval not displayed.    TUMOR MARKERS: No results for input(s): "AFPTM", "CEA", "CA199", "CHROMGRNA" in the last 8760 hours.  Assessment and Plan:  JAZARIA JARECKI is a 87 y.o. female with history of left-sided renal cell carcinoma, post endovascular embolization and microwave ablation on 08/22/2021.  Postprocedural course complicated by development of a delayed urine leak ultimately managed both with a double-J ureteral stent as well as a perinephric drainage catheter and ultimately with a nephrostomy catheter.  The patient has subsequently undergone perinephric drainage catheter  removal as well as removal of the double-J ureteral stent.   The patient returns to the interventional radiology drain clinic for drainage catheter evaluation and management.  The patient's left-sided percutaneous nephrostomy catheter has been capped since removal of the left-sided double-J ureteral stent 2 weeks ago.  The patient reports general malaise and a consistent headache, worse during the past several  days.   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   Nephrostogram via left sided PCN demonstrates appropriate positioning of the nephrostomy catheter with development of moderate left-sided pelvicaliectasis secondary to trial of nephrostomy capping.    Aspiration of dark brown colored purulent smelling urine.   PLAN:  - The patient's nephrostomy catheter was reconnected to a gravity bag.  The patient's son was instructed to flush the nephrostomy catheter with 5-10 cc of normal saline twice per day for the next several days until the urine no longer appears cloudy. - The patient was prescribed Levaquin 750 mg p.o. q.day for 5 days for suspected urinary tract infection.  (Note, I am hopeful that the suspected contained urine leak will NOT require dedicated intervention, such as CT guided aspiration or drain placement, and will resolve with PCN decompression and antibiotics.) - We will arrange for the patient to undergo routine nephrostomy catheter exchange potential up sizing next week with subsequent exchanges to be performed every 8-10 weeks going forward.   Thank you for this interesting consult.  I greatly enjoyed meeting Kim Burns and look forward to participating in their care.  A copy of this report was sent to the requesting provider on this date.  Electronically Signed: Simonne Come 06/20/2022, 2:44 PM   I spent a total of  15 Minutes in face to face in clinical consultation, greater than 50% of which was counseling/coordinating care for PCN management.

## 2022-06-21 ENCOUNTER — Inpatient Hospital Stay: Payer: Medicare PPO | Attending: Oncology | Admitting: Oncology

## 2022-06-21 ENCOUNTER — Inpatient Hospital Stay: Payer: Medicare PPO

## 2022-06-21 ENCOUNTER — Other Ambulatory Visit: Payer: Self-pay

## 2022-06-21 ENCOUNTER — Other Ambulatory Visit: Payer: Self-pay | Admitting: Interventional Radiology

## 2022-06-21 VITALS — BP 170/84 | HR 90 | Temp 97.7°F | Resp 17 | Wt 130.6 lb

## 2022-06-21 DIAGNOSIS — D649 Anemia, unspecified: Secondary | ICD-10-CM | POA: Diagnosis not present

## 2022-06-21 DIAGNOSIS — D631 Anemia in chronic kidney disease: Secondary | ICD-10-CM

## 2022-06-21 DIAGNOSIS — Z90411 Acquired partial absence of pancreas: Secondary | ICD-10-CM | POA: Insufficient documentation

## 2022-06-21 DIAGNOSIS — I2510 Atherosclerotic heart disease of native coronary artery without angina pectoris: Secondary | ICD-10-CM | POA: Insufficient documentation

## 2022-06-21 DIAGNOSIS — N151 Renal and perinephric abscess: Secondary | ICD-10-CM

## 2022-06-21 DIAGNOSIS — L299 Pruritus, unspecified: Secondary | ICD-10-CM | POA: Diagnosis not present

## 2022-06-21 DIAGNOSIS — Z79899 Other long term (current) drug therapy: Secondary | ICD-10-CM | POA: Insufficient documentation

## 2022-06-21 DIAGNOSIS — N186 End stage renal disease: Secondary | ICD-10-CM | POA: Insufficient documentation

## 2022-06-21 DIAGNOSIS — E1122 Type 2 diabetes mellitus with diabetic chronic kidney disease: Secondary | ICD-10-CM | POA: Insufficient documentation

## 2022-06-21 DIAGNOSIS — N2889 Other specified disorders of kidney and ureter: Secondary | ICD-10-CM

## 2022-06-21 DIAGNOSIS — Z7901 Long term (current) use of anticoagulants: Secondary | ICD-10-CM | POA: Diagnosis not present

## 2022-06-21 DIAGNOSIS — Z85528 Personal history of other malignant neoplasm of kidney: Secondary | ICD-10-CM | POA: Insufficient documentation

## 2022-06-21 DIAGNOSIS — I4891 Unspecified atrial fibrillation: Secondary | ICD-10-CM | POA: Insufficient documentation

## 2022-06-21 DIAGNOSIS — Z992 Dependence on renal dialysis: Secondary | ICD-10-CM | POA: Insufficient documentation

## 2022-06-21 DIAGNOSIS — Z794 Long term (current) use of insulin: Secondary | ICD-10-CM | POA: Insufficient documentation

## 2022-06-21 DIAGNOSIS — N2581 Secondary hyperparathyroidism of renal origin: Secondary | ICD-10-CM | POA: Diagnosis not present

## 2022-06-21 LAB — CBC WITH DIFFERENTIAL (CANCER CENTER ONLY)
Abs Immature Granulocytes: 0.07 10*3/uL (ref 0.00–0.07)
Basophils Absolute: 0 10*3/uL (ref 0.0–0.1)
Basophils Relative: 0 %
Eosinophils Absolute: 0.2 10*3/uL (ref 0.0–0.5)
Eosinophils Relative: 2 %
HCT: 33.2 % — ABNORMAL LOW (ref 36.0–46.0)
Hemoglobin: 10.2 g/dL — ABNORMAL LOW (ref 12.0–15.0)
Immature Granulocytes: 1 %
Lymphocytes Relative: 8 %
Lymphs Abs: 0.8 10*3/uL (ref 0.7–4.0)
MCH: 28.7 pg (ref 26.0–34.0)
MCHC: 30.7 g/dL (ref 30.0–36.0)
MCV: 93.3 fL (ref 80.0–100.0)
Monocytes Absolute: 0.5 10*3/uL (ref 0.1–1.0)
Monocytes Relative: 5 %
Neutro Abs: 8.5 10*3/uL — ABNORMAL HIGH (ref 1.7–7.7)
Neutrophils Relative %: 84 %
Platelet Count: 311 10*3/uL (ref 150–400)
RBC: 3.56 MIL/uL — ABNORMAL LOW (ref 3.87–5.11)
RDW: 16.1 % — ABNORMAL HIGH (ref 11.5–15.5)
WBC Count: 10 10*3/uL (ref 4.0–10.5)
nRBC: 0.4 % — ABNORMAL HIGH (ref 0.0–0.2)

## 2022-06-21 LAB — DIRECT ANTIGLOBULIN TEST (NOT AT ARMC)
DAT, IgG: NEGATIVE
DAT, complement: NEGATIVE

## 2022-06-21 LAB — CMP (CANCER CENTER ONLY)
ALT: 21 U/L (ref 0–44)
AST: 19 U/L (ref 15–41)
Albumin: 2.8 g/dL — ABNORMAL LOW (ref 3.5–5.0)
Alkaline Phosphatase: 98 U/L (ref 38–126)
Anion gap: 7 (ref 5–15)
BUN: 11 mg/dL (ref 8–23)
CO2: 36 mmol/L — ABNORMAL HIGH (ref 22–32)
Calcium: 7.6 mg/dL — ABNORMAL LOW (ref 8.9–10.3)
Chloride: 97 mmol/L — ABNORMAL LOW (ref 98–111)
Creatinine: 2.37 mg/dL — ABNORMAL HIGH (ref 0.44–1.00)
GFR, Estimated: 20 mL/min — ABNORMAL LOW (ref >60)
Glucose, Bld: 173 mg/dL — ABNORMAL HIGH (ref 70–99)
Potassium: 3.1 mmol/L — ABNORMAL LOW (ref 3.5–5.1)
Sodium: 140 mmol/L (ref 135–145)
Total Bilirubin: 0.3 mg/dL (ref 0.3–1.2)
Total Protein: 6 g/dL — ABNORMAL LOW (ref 6.5–8.1)

## 2022-06-21 LAB — FERRITIN: Ferritin: 1008 ng/mL — ABNORMAL HIGH (ref 11–307)

## 2022-06-21 LAB — FOLATE: Folate: 30.2 ng/mL (ref >5.9)

## 2022-06-21 LAB — VITAMIN B12: Vitamin B-12: 286 pg/mL (ref 180–914)

## 2022-06-21 NOTE — Progress Notes (Signed)
[facility] Initial Visit:  Patient Care Team: Lewis Moccasin, MD as PCP - General (Family Medicine) [place], Peter M, MD as PCP - Cardiology (Cardiology) Linna Darner, RD as Dietitian (Family Medicine) Center, [facility]  CHIEF COMPLAINTS/PURPOSE OF CONSULTATION:  HISTORY OF PRESENTING ILLNESS: Kim Burns 87 y.o. female is here because of   anemia Medical history notable for end-stage renal disease on dialysis, bradycardia, atrial fibrillation on chronic anticoagulation, left renal cell carcinoma, coronary artery disease, left perinephric abscess in September 2023, left renal subcapsular hematoma in December 2023 which required percutaneous drainage and holding of Eliquis  Jun 20, 2022: CT abdomen pelvis demonstrated.   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  Positioning of left percutaneous nephrostomy catheter.  Left sided renal ablation and embolization without evidence of recurrent disease.  Decrease in size of perinephric hematoma now 4.9 cm previously 11.2 cm.  Stable sequela of polycystic disease and end-stage renal disease.  Stable sequela of previous Whipple.  Jun 21 2022:  [facility]  Patient began HD in January 2023 with dialysis days of MWF via a fistula.  She receives an ESA at the dialysis center.  In July 2023 she underwent ablation of a left renal carcinoma.  December 2023 developed left perinephric hematoma while on Eliquis.  She is no longer on anticoagulation.  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   She is not taking oral iron because she does not like the taste of them even though she has a degree of anosmia.  She received PRBC's in 1985 and has  received PRBC's twice since starting HD.  Does not take NSAIDS by mouth.  Patient had more bleeding from fistula today upon conclusion of HD than usual  Social:  Widowed.  Worked in a [employer] trader.  Tobacco none.  EtOH none   Review of Systems  Constitutional:  Positive for fatigue. Negative for appetite change, chills, fever and unexpected weight change.       Cold intolerance  HENT:   Positive for mouth sores. Negative for hearing loss, lump/mass, nosebleeds, sore throat and voice change.        Sometimes gets choked, particularly with pills.    Eyes:  Negative for eye problems and icterus.       Vision changes:  None  Respiratory:  Negative for chest tightness, hemoptysis, shortness of breath and wheezing.        Slight dry cough.  DOE going up and down steps    Cardiovascular:  Positive for leg swelling and palpitations. Negative for chest pain.       PND:  none Orthopnea:  none  Gastrointestinal:  Negative for blood in stool, constipation, nausea and vomiting.       Occasional LLQ abdominal pain.  Some diarrhea related to Whipple surgery  Endocrine: Negative for hot flashes.       Cold intolerance:  none Heat intolerance:  none  Genitourinary:  Negative for difficulty urinating and dysuria.        Still makes urine.  Some gross hematuria a few weeks ago  Musculoskeletal:  Negative for arthralgias, back pain, gait problem, myalgias, neck pain and neck stiffness.  Skin:  Negative for itching, rash and wound.  Neurological:  Negative for dizziness, extremity weakness, gait problem, headaches, light-headedness, numbness, seizures and speech difficulty.  Hematological:  Negative for adenopathy. Does not bruise/bleed easily.    MEDICAL HISTORY: Past Medical History:  Diagnosis Date   Anemia of chronic disease    takes iron   Anxiety    Blood transfusion without reported diagnosis    Bradycardia    Diabetes mellitus without complication (HCC)    became diabetic after Whipple  procedure   Diarrhea    Dysrhythmia 04/16/2016   bradycardia due to medication    ESRD on hemodialysis [facility])    M-W-F   GERD (gastroesophageal reflux disease)    Gout    Headache    History of kidney stones 06/2013   Hyperparathyroidism (HCC)    Hypertension    PONV (postoperative nausea and vomiting)    Sleep apnea    no cpap machine. could not tolerate   Stroke (HCC) 04/16/2016   TIA 1995   Vitamin D deficiency     SURGICAL HISTORY: Past Surgical History:  Procedure Laterality Date   A/V FISTULAGRAM Left 08/30/2021   Procedure: A/V Fistulagram;  Surgeon: Cephus Shelling, MD;  Location[facility] INVASIVE CV LAB;  Service: Cardiovascular;  Laterality: Left;   ABDOMINAL HYSTERECTOMY  1985   complete   BACK SURGERY  1980   lower   BASCILIC VEIN TRANSPOSITION Left 04/24/2017   Procedure: LEFT ARM FIRST STAGE BASILIC VEIN TRANSPOSITION;  Surgeon: Nada Libman, MD;  Location: [facility] OR;  Service: Vascular;  Laterality: Left;   BASCILIC VEIN TRANSPOSITION Left 07/10/2017   Procedure: SECOND STAGE BASILIC VEIN TRANSPOSITION LEFT ARM;  Surgeon: Nada Libman, MD;  Location: [facility] OR;  Service: Vascular;  Laterality: Left;   BREAST LUMPECTOMY Left x 2   many years apart, benign   CHOLECYSTECTOMY  1985   CYSTOSCOPY W/ URETERAL STENT PLACEMENT Left 10/31/2021   Procedure: CYSTOSCOPY WITH RETROGRADE PYELOGRAM/URETERAL STENT PLACEMENT;  Surgeon: Crist Fat, MD;  Location: [facility] OR;  Service: Urology;  Laterality: Left;   CYSTOSCOPY WITH URETEROSCOPY AND STENT PLACEMENT Left 06/18/2013   Procedure: CYSTOSCOPY WITH Lef URETEROSCOPY AND Left STENT PLACEMENT;  Surgeon: Crecencio Mc, MD;  Location: [facility] ORS;  Service: Urology;  Laterality: Left;   ESOPHAGOGASTRODUODENOSCOPY (EGD) WITH PROPOFOL N/A 11/13/2016   Procedure: ESOPHAGOGASTRODUODENOSCOPY (EGD) WITH PROPOFOL;  Surgeon: Kathi Der, MD;  Location: [facility] ENDOSCOPY;  Service: Gastroenterology;  Laterality: N/A;   EUS N/A 02/07/2016    Procedure: ESOPHAGEAL ENDOSCOPIC ULTRASOUND (EUS) RADIAL;  Surgeon: Willis Modena, MD;  Location: [facility] ENDOSCOPY;  Service: Endoscopy;  Laterality: N/A;   EYE SURGERY Bilateral 2014   ioc for catracts    FOOT SURGERY Left 1990   something with toes unsure what    HERNIA REPAIR  2018   IR CATHETER TUBE CHANGE  03/14/2022   IR EMBO TUMOR ORGAN ISCHEMIA INFARCT INC GUIDE ROADMAPPING  08/22/2021   IR NEPHROSTOMY PLACEMENT LEFT  03/14/2022   IR RADIOLOGIST EVAL & MGMT  05/18/2021   IR RADIOLOGIST EVAL & MGMT  10/01/2021   IR RADIOLOGIST EVAL & MGMT  11/12/2021   IR RADIOLOGIST EVAL & MGMT  11/22/2021   IR RADIOLOGIST EVAL & MGMT  11/27/2021   IR RADIOLOGIST EVAL & MGMT  12/20/2021   IR RENAL SUPRASEL UNI S&I MOD SED  08/22/2021   IR US GUIDE VASC ACCESS RIGHT  08/22/2021   LEFT HEART CATH AND CORONARY ANGIOGRAPHY N/A 08/27/2021   Procedure: LEFT HEART CATH AND CORONARY ANGIOGRAPHY;  Surgeon: Clifton James,  Nile Dear, MD;  Location: [facility] INVASIVE CV LAB;  Service: Cardiovascular;  Laterality: N/A;   PARATHYROID EXPLORATION     PERIPHERAL VASCULAR BALLOON ANGIOPLASTY Left 08/30/2021   Procedure: PERIPHERAL VASCULAR BALLOON ANGIOPLASTY;  Surgeon: Cephus Shelling, MD;  Location: [facility] INVASIVE CV LAB;  Service: Cardiovascular;  Laterality: Left;  arm fistula   RADIOLOGY WITH ANESTHESIA Left 08/22/2021   Procedure: MICROWAVE ABLATION;  Surgeon: Simonne Come, MD;  Location: [facility] ORS;  Service: Anesthesiology;  Laterality: Left;   WHIPPLE PROCEDURE N/A 04/23/2016   Procedure: WHIPPLE PROCEDURE;  Surgeon: Almond Lint, MD;  Location: [facility] OR;  Service: General;  Laterality: N/A;    SOCIAL HISTORY: Social History   Socioeconomic History   Marital status: Widowed    Spouse name: Not on file   Number of children: Not on file   Years of education: Not on file   Highest education level: Not on file  Occupational History   Not on file  Tobacco Use   Smoking status: Never   Smokeless tobacco: Never  Vaping Use    Vaping Use: Never used  Substance and Sexual Activity   Alcohol use: No   Drug use: No   Sexual activity: Not on file  Other Topics Concern   Not on file  Social History Narrative   Not on file   Social Determinants of Health   Financial Resource Strain: Not on file  Food Insecurity: No Food Insecurity (11/01/2021)   Hunger Vital Sign    Worried About Running Out of Food in the Last Year: Never true    Ran Out of Food in the Last Year: Never true  Transportation Needs: No Transportation Needs (11/01/2021)   PRAPARE - Administrator, Civil Service (Medical): No    Lack of Transportation (Non-Medical): No  Physical Activity: Not on file  Stress: Not on file  Social Connections: Not on file  Intimate Partner Violence: Not At Risk (11/01/2021)   Humiliation, Afraid, Rape, and Kick questionnaire    Fear of Current or Ex-Partner: No    Emotionally Abused: No    Physically Abused: No    Sexually Abused: No    FAMILY HISTORY Family History  Problem Relation Age of Onset   Heart disease Mother    Stroke Mother    Cancer Father        colon   Alzheimer's disease Sister    Alzheimer's disease Sister    Cancer Brother        brain   Breast cancer Neg Hx     ALLERGIES:  is allergic to lopid [gemfibrozil], lotemax [loteprednol etabonate], statins, norco [hydrocodone-acetaminophen], other, tomato, vibra-tab [doxycycline], amlodipine, codeine, hydrocodone, hydrocodone-acetaminophen, lisinopril, and verapamil.  MEDICATIONS:  Current Outpatient Medications  Medication Sig Dispense Refill   acetaminophen (TYLENOL) 325 MG tablet Take 2 tablets (650 mg total) by mouth every 6 (six) hours as needed for mild pain, fever or headache.     amiodarone (PACERONE) 200 MG tablet Take 0.5 tablets (100 mg total) by mouth daily. Please keep appointment for further refills 90 tablet 3   B Complex-C-Zn-Folic Acid (DIALYVITE 800 WITH ZINC) 0.8 MG TABS Take 1 tablet by mouth daily.      carvedilol (COREG) 3.125 MG tablet Take 1 tablet (3.125 mg total) by mouth 2 (two) times daily with a meal. Take on the days you do not have dialysis 60 tablet 1   Cholecalciferol (VITAMIN D-3 PO) Take 1 capsule by mouth every Monday, Wednesday, and  Friday with hemodialysis.     colestipol (COLESTID) 1 g tablet Take 1 g by mouth See admin instructions. 1 tablet (1g) prior to dialysis M-W-F + 1 tablet daily PRN diarrhea     diclofenac sodium (VOLTAREN) 1 % GEL Apply 2 g topically 4 (four) times daily as needed (for hand pain).     folic acid (FOLVITE) 1 MG tablet Take 1 tablet (1 mg total) by mouth daily.     hydrALAZINE (APRESOLINE) 50 MG tablet Take 0.5 tablets (25 mg total) by mouth 3 (three) times daily. Take on non dialysis days 144 tablet 3   insulin aspart (NOVOLOG) 100 UNIT/ML injection Inject 0-6 Units into the skin 3 (three) times daily with meals. 10 mL 11   levofloxacin (LEVAQUIN) 750 MG tablet Take 1 tablet (750 mg total) by mouth daily for 5 days. 5 tablet 0   lidocaine-prilocaine (EMLA) cream Apply 1 Application topically every Monday, Wednesday, and Friday with hemodialysis.     lipase/protease/amylase (CREON) 36000 UNITS CPEP capsule Take 36,000 Units by mouth 2 (two) times daily with a meal.     midodrine (PROAMATINE) 10 MG tablet Take 10 mg on dialysis days Mon-Wed-Fri     olopatadine (PATADAY) 0.1 % ophthalmic solution Place 1 drop into both eyes 2 (two) times daily.     RESTASIS 0.05 % ophthalmic emulsion 1 drop 2 (two) times daily.     rOPINIRole (REQUIP) 0.25 MG tablet Take 0.25 mg by mouth at bedtime.     SENSIPAR 30 MG tablet Take by mouth.     sevelamer carbonate (RENVELA) 800 MG tablet Take 1,600 mg by mouth 3 (three) times daily with meals.     XIIDRA 5 % SOLN Place 1 drop into both eyes at bedtime.     ferrous sulfate 325 (65 FE) MG tablet Take 1 tablet (325 mg total) by mouth daily with breakfast. (Patient not taking: Reported on 06/21/2022)  3   pantoprazole (PROTONIX)  40 MG tablet Take 1 tablet (40 mg total) by mouth daily. (Patient not taking: Reported on 06/21/2022) 30 tablet 0   No current facility-administered medications for this visit.    PHYSICAL EXAMINATION:  ECOG PERFORMANCE STATUS: 1 - Symptomatic but completely ambulatory   Vitals:   06/21/22 1059  BP: (!) 170/84  Pulse: 90  Resp: 17  Temp: 97.7 F (36.5 C)  SpO2: 100%    Filed Weights   06/21/22 1059  Weight: 130 lb 9.6 oz (59.2 kg)     Physical Exam Vitals and nursing note reviewed.  Constitutional:      Appearance: Normal appearance. She is not toxic-appearing or diaphoretic.     Comments: Here with son.    HENT:     Head: Normocephalic and atraumatic.     Right Ear: External ear normal.     Left Ear: External ear normal.     Nose: Nose normal. No congestion or rhinorrhea.  Eyes:     General: No scleral icterus.    Extraocular Movements: Extraocular movements intact.     Conjunctiva/sclera: Conjunctivae normal.     Pupils: Pupils are equal, round, and reactive to light.  Cardiovascular:     Rate and Rhythm: Normal rate.     Heart sounds: No murmur heard.    No friction rub. No gallop.  Abdominal:     General: Bowel sounds are normal.     Palpations: Abdomen is soft.  Musculoskeletal:        General: No swelling, tenderness or  deformity.     Cervical back: Normal range of motion and neck supple. No rigidity or tenderness.  Lymphadenopathy:     Head:     Right side of head: No submental, submandibular, tonsillar, preauricular, posterior auricular or occipital adenopathy.     Left side of head: No submental, submandibular, tonsillar, preauricular, posterior auricular or occipital adenopathy.     Cervical: No cervical adenopathy.     Right cervical: No superficial, deep or posterior cervical adenopathy.    Left cervical: No superficial, deep or posterior cervical adenopathy.     Upper Body:     Right upper body: No supraclavicular, axillary, pectoral or  epitrochlear adenopathy.     Left upper body: No supraclavicular, axillary, pectoral or epitrochlear adenopathy.  Skin:    General: Skin is warm.     Coloration: Skin is not jaundiced.  Neurological:     General: No focal deficit present.     Mental Status: She is alert and oriented to person, place, and time.     Cranial Nerves: No cranial nerve deficit.  Psychiatric:        Mood and Affect: Mood normal.        Behavior: Behavior normal.        Thought Content: Thought content normal.        Judgment: Judgment normal.    LABORATORY DATA: I have personally reviewed the data as listed:  No visits with results within 1 Month(s) from this visit.  Latest known visit with results is:  Hospital Outpatient Visit on 03/14/2022  Component Date Value Ref Range Status   WBC 03/14/2022 6.9  4.0 - 10.5 K/uL Final   RBC 03/14/2022 3.79 (L)  3.87 - 5.11 MIL/uL Final   Hemoglobin 03/14/2022 11.0 (L)  12.0 - 15.0 g/dL Final   HCT 40/98/1191 35.1 (L)  36.0 - 46.0 % Final   MCV 03/14/2022 92.6  80.0 - 100.0 fL Final   MCH 03/14/2022 29.0  26.0 - 34.0 pg Final   MCHC 03/14/2022 31.3  30.0 - 36.0 g/dL Final   RDW 47/82/9562 17.1 (H)  11.5 - 15.5 % Final   Platelets 03/14/2022 218  150 - 400 K/uL Final   nRBC 03/14/2022 0.0  0.0 - 0.2 % Final   Performed at [facility], [address] [place], [place] [address]   Prothrombin Time 03/14/2022 13.4  11.4 - 15.2 seconds Final   INR 03/14/2022 1.0  0.8 - 1.2 Final   Comment: (NOTE) INR goal varies based on device and disease states. Performed at [facility], [address] [place], [place] [address]    Sodium 03/14/2022 141  135 - 145 mmol/L Final   Potassium 03/14/2022 4.0  3.5 - 5.1 mmol/L Final   Chloride 03/14/2022 100  98 - 111 mmol/L Final   CO2 03/14/2022 29  22 - 32 mmol/L Final   Glucose, Bld 03/14/2022 120 (H)  70 - 99 mg/dL Final   Glucose reference range applies only to samples taken after fasting for at least 8 hours.    BUN 03/14/2022 30 (H)  8 - 23 mg/dL Final   Creatinine, Ser 03/14/2022 4.17 (H)  0.44 - 1.00 mg/dL Final   Calcium 96/29/5284 8.2 (L)  8.9 - 10.3 mg/dL Final   GFR, Estimated 03/14/2022 10 (L)  >60 mL/min Final   Comment: (NOTE) Calculated using the CKD-EPI Creatinine Equation (2021)    Anion gap 03/14/2022 12  5 - 15 Final  Performed at [facility], [address], [place], [place] [address]   Glucose-Capillary 03/14/2022 124 (H)  70 - 99 mg/dL Final   Glucose reference range applies only to samples taken after fasting for at least 8 hours.   Comment 1 03/14/2022 Notify RN   Final   Comment 2 03/14/2022 Document in Chart   Final    RADIOGRAPHIC STUDIES: I have personally reviewed the radiological images as listed and agree with the findings in the report  DG Sinus/Fist Tube Chk-Non GI  Result Date: 06/20/2022 CLINICAL DATA:  History of left-sided renal cell carcinoma, post endovascular embolization and microwave ablation on 08/22/2021. Postprocedural course complicated by development of a delayed urine leak managed both with a double-J ureteral stent as well as a perinephric drainage catheter and ultimately with a nephrostomy catheter. The patient has subsequently undergone perinephric drainage catheter removal as well as removal of the double-J ureteral stent. The patient's left-sided percutaneous nephrostomy catheter has been capped since removal of the left-sided double-J ureteral stent 2 weeks ago. The patient returns to the interventional radiology drain clinic for drainage catheter evaluation and management. The patient reports general malaise and a consistent headache, worse during the past several days. 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 Stable positioning of left-sided percutaneous nephrostomy catheter with end  coiled and locked within the accessed renal calyx. Patient now presents for fluoroscopic guided drainage catheter injection. EXAM: ABSCESS INJECTION COMPARISON:  CT scan abdomen and pelvis-earlier same day Multiple previous fluoroscopic guided drainage catheter injections, most recently on 05/17/2022 CONTRAST:  10 cc Omnipaque 300-administered via the existing percutaneous drain. FLUOROSCOPY: 26 seconds (1.9 mGy) TECHNIQUE: The patient was positioned prone on the fluoroscopy table. A preprocedural spot fluoroscopic image was obtained of the left flank and the existing nephrostomy catheter. Multiple spot fluoroscopic and radiographic images were obtained following the injection of a small amount of contrast via the existing percutaneous drainage catheter. Images were reviewed and discussed with the patient. The nephrostomy catheter was injected with a small amount of saline and reconnected to a gravity bag. A dressing was applied. The patient tolerated the procedure well without immediate postprocedural complication. FINDINGS: Preprocedural spot fluoroscopic image demonstrates stable positioning of left-sided nephrostomy catheter with end coiled and locked over the expected location of the accessed renal calyx. Embolization coils overlie the expected location of the inferior pole of the left kidney. Contrast injection demonstrates moderate left-sided pelvicaliectasis with patulous appearance of the left renal collecting system. Aspiration of urine was noted to be dark brown colored and purulent smelling. IMPRESSION: 1. Stable positioning of left-sided nephrostomy catheter. 2. Nephrostogram demonstrates appropriate positioning of the nephrostomy catheter with development of moderate left-sided pelvicaliectasis secondary to trial of nephrostomy capping. 3. Aspiration of dark brown colored purulent smelling urine. PLAN: - The patient's nephrostomy catheter was reconnected to a gravity bag. The patient's son was  instructed to flush the nephrostomy catheter with 5-10 cc of normal saline twice per day for the next several days until the urine no longer appears cloudy. - The patient was prescribed Levaquin 750 mg p.o. q.day for 5 days for suspected urinary tract infection. - We will arrange for the patient to undergo routine nephrostomy catheter exchange potential up sizing next week. Electronically Signed   By: Simonne Come M.D.   On: 06/20/2022 14:43   CT ABDOMEN PELVIS W WO CONTRAST  Result Date: 06/20/2022 CLINICAL DATA:  History of left-sided renal cell carcinoma, post endovascular embolization and microwave ablation on 08/22/2021. Postprocedural course complicated by development of a delayed urine leak ultimately managed both with a double-J ureteral stent as well as a perinephric drainage catheter and ultimately with a nephrostomy catheter. Patient has subsequently undergone perinephric drainage catheter removal as well as removal of the double-J ureteral stent. Patient returns to the interventional radiology drain clinic for drainage catheter evaluation and management. The patient's left-sided percutaneous nephrostomy catheter has been capped since removal of the left-sided double-J ureteral stent 2 weeks ago. Patient reports general malaise and a consistent headache, worse during the past several days. EXAM: CT ABDOMEN AND PELVIS WITHOUT AND WITH CONTRAST TECHNIQUE: Multidetector CT imaging of the abdomen and pelvis was performed following the standard protocol before and following the bolus administration of intravenous contrast. RADIATION DOSE REDUCTION: This exam was performed according to the departmental dose-optimization program which includes automated exposure control, adjustment of the mA and/or kV according to patient size and/or use of iterative reconstruction technique. CONTRAST:  ISOVUE-300 IOPAMIDOL (ISOVUE-300) INJECTION 61% COMPARISON:  CT abdomen and pelvis - 03/22/2022; 02/02/2022; 05/03/2021  Multiple previous fluoroscopic guided drainage catheter injections, most recently on 05/17/2022 FINDINGS: Lower chest: Limited visualization of the lower thorax demonstrates advanced emphysematous change within the imaged lung bases. Small mesenteric fat containing left-sided diaphragmatic hernia. Cardiomegaly. Pericardial effusion. Hepatobiliary: Normal hepatic contour. No discrete hepatic lesions. Postcholecystectomy. No intra or extrahepatic biliary ductal dilatation. No ascites. Pancreas: Stable sequela of previous Whipple procedure. Spleen: Punctate subcentimeter hypoattenuating splenic lesions, unchanged, potentially representative of hepatic cysts. Adrenals/Urinary Tract: There are innumerable hypoattenuating renal lesion seen bilaterally compatible with known history of polycystic kidney disease and end-stage renal disease. Redemonstrated right-sided cortical calcifications versus nonobstructing nephrolithiasis. Stable sequela of previous embolization and ablation of previously noted enhancing left-sided renal mass without evidence of locally recurrent or residual disease, though note, evaluation degraded secondary to extensive, though note, evaluation degraded secondary to extensive postprocedural change affecting the left kidney postprocedural change affecting the left kidney. No new definitive definable/drainable renal lesions. Interval removal of left-sided double-J ureteral stent as well as left-sided perinephric drainage catheter with remaining percutaneous nephrostomy catheter coiled and locked within the accessed renal calyx. Progressive now severe left-sided pelvicaliectasis, now with development of an approximately 4.1 x 2.6 x 3.9 cm fluid collection/urinoma within the superior aspect of the left iliopsoas musculature (axial image 78, series 11; coronal image 88, series 13 close), adjacent to known proximal ureteral leak. Interval reduction in size of previously noted perinephric hematoma with  dominant evolving hematoma now measuring 4.9 x 5.2 x 2.0 cm (coronal image 97, series 13; axial image 52, series 11), previously, 11.2 x 8.8 x 4.8 cm. Stomach/Bowel: Stable sequela of previous Whipple procedure without evidence of enteric obstruction. Moderate colonic stool burden. No evidence of enteric obstruction. Normal appearance of the terminal ileum. Post appendectomy. No significant hiatal hernia. No pneumoperitoneum, pneumatosis or portal venous gas. Vascular/Lymphatic: Moderate-to-large amount of eccentric predominantly calcified atherosclerotic plaque throughout the abdominal aorta, not resulting in a hemodynamically significant stenosis. Scattered retroperitoneal lymph nodes are mildly prominent though individually not enlarged by size criteria with index left periaortic lymph node measuring 0.8 cm in greatest short axis diameter (image 82, series 11). No bulky retroperitoneal, mesenteric, pelvic or inguinal lymphadenopathy. Reproductive: Post hysterectomy. No discrete adnexal lesions. Multiple phleboliths are seen with the lower pelvis bilaterally. Other: Diffuse body wall anasarca. Note is made of several tiny midline  mesenteric fat containing ventral wall hernias. Musculoskeletal: No acute or aggressive osseous abnormalities. Moderate multilevel lumbar spine DDD, worse at L2-L3, L3-L4 and L4-L5 with disc space height loss, endplate irregularity and sclerosis. Moderate to severe degenerative change of the bilateral hips with joint space loss, subchondral sclerosis and osteophytosis, right-greater-than-left. IMPRESSION: 1. 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 2. Stable positioning of left-sided percutaneous nephrostomy catheter with end coiled and locked within the accessed renal calyx. 3. Post  left-sided renal ablation and embolization without evidence of locally recurrent or residual disease. 4. Interval decrease in size of known perinephric hematoma, currently measuring 4.9 cm, previously, 11.2 cm. 5. Stable sequela of polycystic kidney disease and end-stage renal disease without discrete worrisome renal lesion. 6. Stable sequela of previous Whipple without evidence of enteric obstruction. 7. Aortic Atherosclerosis (ICD10-I70.0) and Emphysema (ICD10-J43.9). PLAN: Patient subsequently underwent percutaneous drainage catheter injection. Electronically Signed   By: Simonne Come M.D.   On: 06/20/2022 14:33    ASSESSMENT/PLAN  87 y.o. female is here because of   anemia.  Medical history notable for end-stage renal disease on dialysis, bradycardia, atrial fibrillation on chronic anticoagulation, left renal cell carcinoma, coronary artery disease, left perinephric abscess in September 2023, left renal subcapsular hematoma in December 2023 which required percutaneous drainage and holding of Eliquis  Anemia:   Etiology multifactorial with likely culprits being 1) Iron deficiency from occult GI blood loss 2) Gross blood loss related to perinephric abscess exacerbated by anticoagulant therapy 3) CKD 4) Chronic inflammation    Will obtain CBC with diff, CMP, Ferritin, B12, folate, retic count,  DAT, Haptoglobin, SPEP with IEP, free light chains, Copper and Zinc levels.  Obtain recoreds from dialysis center to determine how much Epo patient is receiving.  Therapeutic recommendations to be made based on labs and information from HD records  Cancer Staging  No matching staging information was found for the patient.   No problem-specific Assessment & Plan notes found for this encounter.    No orders of the defined types were placed in this encounter.   45  minutes was spent in patient care.  This included time spent preparing to see the patient (e.g., review of tests), obtaining and/or reviewing  separately obtained history, counseling and educating the patient/family/caregiver, ordering medications, tests, or procedures; documenting clinical information in the electronic or other health record, independently interpreting results and communicating results to the patient/family/caregiver as well as coordination of care.       All questions were answered. The patient knows to call the clinic with any problems, questions or concerns.  This note was electronically signed.    Loni Muse, MD  06/21/2022 11:11 AM

## 2022-06-23 LAB — HAPTOGLOBIN: Haptoglobin: 241 mg/dL (ref 41–333)

## 2022-06-24 DIAGNOSIS — N186 End stage renal disease: Secondary | ICD-10-CM | POA: Diagnosis not present

## 2022-06-24 DIAGNOSIS — Z992 Dependence on renal dialysis: Secondary | ICD-10-CM | POA: Diagnosis not present

## 2022-06-24 DIAGNOSIS — N2581 Secondary hyperparathyroidism of renal origin: Secondary | ICD-10-CM | POA: Diagnosis not present

## 2022-06-24 DIAGNOSIS — L299 Pruritus, unspecified: Secondary | ICD-10-CM | POA: Diagnosis not present

## 2022-06-25 ENCOUNTER — Telehealth: Payer: Self-pay | Admitting: Oncology

## 2022-06-25 LAB — ZINC: Zinc: 104 ug/dL (ref 44–115)

## 2022-06-25 LAB — COPPER, SERUM: Copper: 65 ug/dL — ABNORMAL LOW (ref 80–158)

## 2022-06-26 ENCOUNTER — Telehealth: Payer: Self-pay | Admitting: Oncology

## 2022-06-26 DIAGNOSIS — N2581 Secondary hyperparathyroidism of renal origin: Secondary | ICD-10-CM | POA: Diagnosis not present

## 2022-06-26 DIAGNOSIS — L299 Pruritus, unspecified: Secondary | ICD-10-CM | POA: Diagnosis not present

## 2022-06-26 DIAGNOSIS — Z992 Dependence on renal dialysis: Secondary | ICD-10-CM | POA: Diagnosis not present

## 2022-06-26 DIAGNOSIS — N186 End stage renal disease: Secondary | ICD-10-CM | POA: Diagnosis not present

## 2022-06-27 ENCOUNTER — Other Ambulatory Visit: Payer: Self-pay | Admitting: Interventional Radiology

## 2022-06-27 ENCOUNTER — Ambulatory Visit (HOSPITAL_COMMUNITY)
Admission: RE | Admit: 2022-06-27 | Discharge: 2022-06-27 | Disposition: A | Discharge status: Home / Self Care | Payer: Medicare PPO | Source: Ambulatory Visit | Attending: Interventional Radiology | Admitting: Interventional Radiology

## 2022-06-27 DIAGNOSIS — N2889 Other specified disorders of kidney and ureter: Secondary | ICD-10-CM

## 2022-06-27 DIAGNOSIS — Z436 Encounter for attention to other artificial openings of urinary tract: Secondary | ICD-10-CM | POA: Insufficient documentation

## 2022-06-27 DIAGNOSIS — Z466 Encounter for fitting and adjustment of urinary device: Secondary | ICD-10-CM | POA: Diagnosis not present

## 2022-06-27 HISTORY — PX: IR NEPHROSTOMY EXCHANGE LEFT: IMG6069

## 2022-06-27 MED ORDER — IOHEXOL 300 MG/ML  SOLN
50.0000 mL | Freq: Once | INTRAMUSCULAR | Status: AC | PRN
  Administered 2022-06-27: 10 mL

## 2022-06-27 MED ORDER — LIDOCAINE HCL 1 % IJ SOLN
INTRAMUSCULAR | Status: AC
  Filled 2022-06-27: qty 20

## 2022-06-27 MED ORDER — LIDOCAINE HCL 1 % IJ SOLN: 10.0000 mL | Freq: Once | INTRAMUSCULAR | Status: DC

## 2022-06-27 NOTE — Procedures (Signed)
Pre Procedure Dx: Chronic urine leak.  Post Procedure Dx: Same  Successful left sided PCN exchange, repositioning and up sizing to now 10 Fr.  EBL: Trace No immediate complications.   Katherina Right, MD Pager #: [phone]

## 2022-06-28 DIAGNOSIS — Z992 Dependence on renal dialysis: Secondary | ICD-10-CM | POA: Diagnosis not present

## 2022-06-28 DIAGNOSIS — L299 Pruritus, unspecified: Secondary | ICD-10-CM | POA: Diagnosis not present

## 2022-06-28 DIAGNOSIS — N2581 Secondary hyperparathyroidism of renal origin: Secondary | ICD-10-CM | POA: Diagnosis not present

## 2022-06-28 DIAGNOSIS — N186 End stage renal disease: Secondary | ICD-10-CM | POA: Diagnosis not present

## 2022-07-01 DIAGNOSIS — D649 Anemia, unspecified: Secondary | ICD-10-CM | POA: Diagnosis not present

## 2022-07-01 DIAGNOSIS — E875 Hyperkalemia: Secondary | ICD-10-CM | POA: Diagnosis not present

## 2022-07-01 DIAGNOSIS — E559 Vitamin D deficiency, unspecified: Secondary | ICD-10-CM | POA: Diagnosis not present

## 2022-07-01 DIAGNOSIS — L299 Pruritus, unspecified: Secondary | ICD-10-CM | POA: Diagnosis not present

## 2022-07-01 DIAGNOSIS — N186 End stage renal disease: Secondary | ICD-10-CM | POA: Diagnosis not present

## 2022-07-01 DIAGNOSIS — N2581 Secondary hyperparathyroidism of renal origin: Secondary | ICD-10-CM | POA: Diagnosis not present

## 2022-07-01 DIAGNOSIS — Z992 Dependence on renal dialysis: Secondary | ICD-10-CM | POA: Diagnosis not present

## 2022-07-03 DIAGNOSIS — L299 Pruritus, unspecified: Secondary | ICD-10-CM | POA: Diagnosis not present

## 2022-07-03 DIAGNOSIS — N186 End stage renal disease: Secondary | ICD-10-CM | POA: Diagnosis not present

## 2022-07-03 DIAGNOSIS — Z992 Dependence on renal dialysis: Secondary | ICD-10-CM | POA: Diagnosis not present

## 2022-07-03 DIAGNOSIS — N2581 Secondary hyperparathyroidism of renal origin: Secondary | ICD-10-CM | POA: Diagnosis not present

## 2022-07-04 ENCOUNTER — Encounter: Payer: Self-pay | Admitting: Oncology

## 2022-07-05 DIAGNOSIS — L299 Pruritus, unspecified: Secondary | ICD-10-CM | POA: Diagnosis not present

## 2022-07-05 DIAGNOSIS — Z992 Dependence on renal dialysis: Secondary | ICD-10-CM | POA: Diagnosis not present

## 2022-07-05 DIAGNOSIS — N2581 Secondary hyperparathyroidism of renal origin: Secondary | ICD-10-CM | POA: Diagnosis not present

## 2022-07-05 DIAGNOSIS — N186 End stage renal disease: Secondary | ICD-10-CM | POA: Diagnosis not present

## 2022-07-08 ENCOUNTER — Encounter (HOSPITAL_COMMUNITY): Payer: Self-pay

## 2022-07-08 DIAGNOSIS — N186 End stage renal disease: Secondary | ICD-10-CM | POA: Diagnosis not present

## 2022-07-08 DIAGNOSIS — Z992 Dependence on renal dialysis: Secondary | ICD-10-CM | POA: Diagnosis not present

## 2022-07-08 DIAGNOSIS — L299 Pruritus, unspecified: Secondary | ICD-10-CM | POA: Diagnosis not present

## 2022-07-08 DIAGNOSIS — N2581 Secondary hyperparathyroidism of renal origin: Secondary | ICD-10-CM | POA: Diagnosis not present

## 2022-07-10 DIAGNOSIS — I10 Essential (primary) hypertension: Secondary | ICD-10-CM | POA: Diagnosis not present

## 2022-07-10 DIAGNOSIS — Z992 Dependence on renal dialysis: Secondary | ICD-10-CM | POA: Diagnosis not present

## 2022-07-10 DIAGNOSIS — E876 Hypokalemia: Secondary | ICD-10-CM | POA: Diagnosis not present

## 2022-07-10 DIAGNOSIS — I4891 Unspecified atrial fibrillation: Secondary | ICD-10-CM | POA: Diagnosis not present

## 2022-07-10 DIAGNOSIS — N2581 Secondary hyperparathyroidism of renal origin: Secondary | ICD-10-CM | POA: Diagnosis not present

## 2022-07-10 DIAGNOSIS — E559 Vitamin D deficiency, unspecified: Secondary | ICD-10-CM | POA: Diagnosis not present

## 2022-07-10 DIAGNOSIS — N186 End stage renal disease: Secondary | ICD-10-CM | POA: Diagnosis not present

## 2022-07-10 DIAGNOSIS — E213 Hyperparathyroidism, unspecified: Secondary | ICD-10-CM | POA: Diagnosis not present

## 2022-07-10 DIAGNOSIS — D509 Iron deficiency anemia, unspecified: Secondary | ICD-10-CM | POA: Diagnosis not present

## 2022-07-10 DIAGNOSIS — L299 Pruritus, unspecified: Secondary | ICD-10-CM | POA: Diagnosis not present

## 2022-07-10 DIAGNOSIS — I509 Heart failure, unspecified: Secondary | ICD-10-CM | POA: Diagnosis not present

## 2022-07-11 ENCOUNTER — Other Ambulatory Visit: Payer: Self-pay | Admitting: Oncology

## 2022-07-11 ENCOUNTER — Encounter: Payer: Self-pay | Admitting: Oncology

## 2022-07-11 DIAGNOSIS — N186 End stage renal disease: Secondary | ICD-10-CM

## 2022-07-11 NOTE — Progress Notes (Unsigned)
[facility] Cancer Follow up Visit:  Patient Care Team: Lewis Moccasin, MD as PCP - General (Family Medicine) [place], Peter M, MD as PCP - Cardiology (Cardiology) Linna Darner, RD as Dietitian (Family Medicine) Center, [facility]  CHIEF COMPLAINTS/PURPOSE OF CONSULTATION:  HISTORY OF PRESENTING ILLNESS: Kim Burns 87 y.o. female is here because of   anemia Medical history notable for end-stage renal disease on dialysis, bradycardia, atrial fibrillation on chronic anticoagulation, left renal cell carcinoma, coronary artery disease, left perinephric abscess in September 2023, left renal subcapsular hematoma in December 2023 which required percutaneous drainage and holding of Eliquis  Jun 20, 2022: CT abdomen pelvis demonstrated.   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  Positioning of left percutaneous nephrostomy catheter.  Left sided renal ablation and embolization without evidence of recurrent disease.  Decrease in size of perinephric hematoma now 4.9 cm previously 11.2 cm.  Stable sequela of polycystic disease and end-stage renal disease.  Stable sequela of previous Whipple.  Jun 21 2022:  [facility]  Patient began HD in January 2023 with dialysis days of MWF via a fistula.  She receives an ESA at the dialysis center.  In July 2023 she underwent ablation of a left renal carcinoma.  December 2023 developed left perinephric hematoma while on Eliquis.  She is no longer on anticoagulation.  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   She is not taking oral iron because she does not like the taste of them even though she has a degree of anosmia.  She received PRBC's in 1985 and has  received PRBC's twice since starting HD.  Does not take NSAIDS by mouth.  Patient had more bleeding from fistula today upon conclusion of HD than usual  Social:  Widowed.  Worked in a [employer] trader.  Tobacco none.  EtOH none  WBC 10.0 hemoglobin 10.2 MCV 93 platelet count 311 by: 84 segs 8 lymphs 5 monos 2 eos Coombs test negative haptoglobin 241 Ferritin 1008 folate 30 Copper 65 B12 286 zinc 104 CMP notable for potassium 3.1 chloride 97 bicarb 36 glucose 173 creatinine 2.37 calcium 7.6 albumin 2.8  Jul 02 2022:  Hgb 11.9  Jul 12 2022:  Scheduled follow up for management of anemia.  Reviewed results of labs with patient and son   Review of Systems  Constitutional:  Positive for fatigue. Negative for appetite change, chills, fever and unexpected weight change.       Cold intolerance  HENT:   Positive for mouth sores. Negative for hearing loss, lump/mass, nosebleeds, sore throat and voice change.        Sometimes gets choked, particularly with pills.    Eyes:  Negative for eye problems and icterus.       Vision changes:  None  Respiratory:  Negative for chest tightness, hemoptysis, shortness of breath and wheezing.        Slight dry cough.  DOE going up and down steps    Cardiovascular:  Positive for leg swelling and palpitations. Negative for chest pain.       PND:  none Orthopnea:  none  Gastrointestinal:  Negative for blood in stool, constipation, nausea and vomiting.       Occasional LLQ abdominal pain.  Some  diarrhea related to Whipple surgery  Endocrine: Negative for hot flashes.       Cold intolerance:  none Heat intolerance:  none  Genitourinary:  Negative for difficulty urinating and dysuria.        Still makes urine.  Some gross hematuria a few weeks ago  Musculoskeletal:  Negative for arthralgias, back pain, gait problem, myalgias, neck pain and neck stiffness.  Skin:  Negative for itching, rash and wound.  Neurological:  Negative for dizziness, extremity weakness, gait  problem, headaches, light-headedness, numbness, seizures and speech difficulty.  Hematological:  Negative for adenopathy. Does not bruise/bleed easily.    MEDICAL HISTORY: Past Medical History:  Diagnosis Date   Anemia of chronic disease    takes iron   Anxiety    Blood transfusion without reported diagnosis    Bradycardia    Diabetes mellitus without complication (HCC)    became diabetic after Whipple procedure   Diarrhea    Dysrhythmia 04/16/2016   bradycardia due to medication    ESRD on hemodialysis [facility])    M-W-F   GERD (gastroesophageal reflux disease)    Gout    Headache    History of kidney stones 06/2013   Hyperparathyroidism (HCC)    Hypertension    PONV (postoperative nausea and vomiting)    Sleep apnea    no cpap machine. could not tolerate   Stroke (HCC) 04/16/2016   TIA 1995   Vitamin D deficiency     SURGICAL HISTORY: Past Surgical History:  Procedure Laterality Date   A/V FISTULAGRAM Left 08/30/2021   Procedure: A/V Fistulagram;  Surgeon: Cephus Shelling, MD;  Location: [facility] INVASIVE CV LAB;  Service: Cardiovascular;  Laterality: Left;   ABDOMINAL HYSTERECTOMY  1985   complete   BACK SURGERY  1980   lower   BASCILIC VEIN TRANSPOSITION Left 04/24/2017   Procedure: LEFT ARM FIRST STAGE BASILIC VEIN TRANSPOSITION;  Surgeon: Nada Libman, MD;  Location: [facility] OR;  Service: Vascular;  Laterality: Left;   BASCILIC VEIN TRANSPOSITION Left 07/10/2017   Procedure: SECOND STAGE BASILIC VEIN TRANSPOSITION LEFT ARM;  Surgeon: Nada Libman, MD;  Location: [facility] OR;  Service: Vascular;  Laterality: Left;   BREAST LUMPECTOMY Left x 2   many years apart, benign   CHOLECYSTECTOMY  1985   CYSTOSCOPY W/ URETERAL STENT PLACEMENT Left 10/31/2021   Procedure: CYSTOSCOPY WITH RETROGRADE PYELOGRAM/URETERAL STENT PLACEMENT;  Surgeon: Crist Fat, MD;  Location[facility] OR;  Service: Urology;  Laterality: Left;   CYSTOSCOPY WITH URETEROSCOPY AND STENT PLACEMENT Left  06/18/2013   Procedure: CYSTOSCOPY WITH Lef URETEROSCOPY AND Left STENT PLACEMENT;  Surgeon: Crecencio Mc, MD;  Location: [facility] ORS;  Service: Urology;  Laterality: Left;   ESOPHAGOGASTRODUODENOSCOPY (EGD) WITH PROPOFOL N/A 11/13/2016   Procedure: ESOPHAGOGASTRODUODENOSCOPY (EGD) WITH PROPOFOL;  Surgeon: Kathi Der, MD;  Location: [facility] ENDOSCOPY;  Service: Gastroenterology;  Laterality: N/A;   EUS N/A 02/07/2016   Procedure: ESOPHAGEAL ENDOSCOPIC ULTRASOUND (EUS) RADIAL;  Surgeon: Willis Modena, MD;  Location: [facility] ENDOSCOPY;  Service: Endoscopy;  Laterality: N/A;   EYE SURGERY Bilateral 2014   ioc for catracts    FOOT SURGERY Left 1990   something with toes unsure what    HERNIA REPAIR  2018   IR CATHETER TUBE CHANGE  03/14/2022   IR EMBO TUMOR ORGAN ISCHEMIA INFARCT INC GUIDE ROADMAPPING  08/22/2021   IR NEPHROSTOMY EXCHANGE LEFT  06/27/2022   IR NEPHROSTOMY PLACEMENT LEFT  03/14/2022   IR RADIOLOGIST EVAL & MGMT  05/18/2021  IR RADIOLOGIST EVAL & MGMT  10/01/2021   IR RADIOLOGIST EVAL & MGMT  11/12/2021   IR RADIOLOGIST EVAL & MGMT  11/22/2021   IR RADIOLOGIST EVAL & MGMT  11/27/2021   IR RADIOLOGIST EVAL & MGMT  12/20/2021   IR RENAL SUPRASEL UNI S&I MOD SED  08/22/2021   IR US GUIDE VASC ACCESS RIGHT  08/22/2021   LEFT HEART CATH AND CORONARY ANGIOGRAPHY N/A 08/27/2021   Procedure: LEFT HEART CATH AND CORONARY ANGIOGRAPHY;  Surgeon: Kathleene Hazel, MD;  Location: [facility] INVASIVE CV LAB;  Service: Cardiovascular;  Laterality: N/A;   PARATHYROID EXPLORATION     PERIPHERAL VASCULAR BALLOON ANGIOPLASTY Left 08/30/2021   Procedure: PERIPHERAL VASCULAR BALLOON ANGIOPLASTY;  Surgeon: Cephus Shelling, MD;  Location: [facility] INVASIVE CV LAB;  Service: Cardiovascular;  Laterality: Left;  arm fistula   RADIOLOGY WITH ANESTHESIA Left 08/22/2021   Procedure: MICROWAVE ABLATION;  Surgeon: Simonne Come, MD;  Location: [facility] ORS;  Service: Anesthesiology;  Laterality: Left;   WHIPPLE PROCEDURE N/A 04/23/2016    Procedure: WHIPPLE PROCEDURE;  Surgeon: Almond Lint, MD;  Location: [facility] OR;  Service: General;  Laterality: N/A;    SOCIAL HISTORY: Social History   Socioeconomic History   Marital status: Widowed    Spouse name: Not on file   Number of children: Not on file   Years of education: Not on file   Highest education level: Not on file  Occupational History   Not on file  Tobacco Use   Smoking status: Never   Smokeless tobacco: Never  Vaping Use   Vaping Use: Never used  Substance and Sexual Activity   Alcohol use: No   Drug use: No   Sexual activity: Not on file  Other Topics Concern   Not on file  Social History Narrative   Not on file   Social Determinants of Health   Financial Resource Strain: Not on file  Food Insecurity: No Food Insecurity (11/01/2021)   Hunger Vital Sign    Worried About Running Out of Food in the Last Year: Never true    Ran Out of Food in the Last Year: Never true  Transportation Needs: No Transportation Needs (11/01/2021)   PRAPARE - Administrator, Civil Service (Medical): No    Lack of Transportation (Non-Medical): No  Physical Activity: Not on file  Stress: Not on file  Social Connections: Not on file  Intimate Partner Violence: Not At Risk (11/01/2021)   Humiliation, Afraid, Rape, and Kick questionnaire    Fear of Current or Ex-Partner: No    Emotionally Abused: No    Physically Abused: No    Sexually Abused: No    FAMILY HISTORY Family History  Problem Relation Age of Onset   Heart disease Mother    Stroke Mother    Cancer Father        colon   Alzheimer's disease Sister    Alzheimer's disease Sister    Cancer Brother        brain   Breast cancer Neg Hx     ALLERGIES:  is allergic to lopid [gemfibrozil], lotemax [loteprednol etabonate], statins, norco [hydrocodone-acetaminophen], other, tomato, vibra-tab [doxycycline], amlodipine, codeine, hydrocodone, hydrocodone-acetaminophen, lisinopril, and  verapamil.  MEDICATIONS:  Current Outpatient Medications  Medication Sig Dispense Refill   acetaminophen (TYLENOL) 325 MG tablet Take 2 tablets (650 mg total) by mouth every 6 (six) hours as needed for mild pain, fever or headache.     amiodarone (PACERONE) 200 MG tablet Take  0.5 tablets (100 mg total) by mouth daily. Please keep appointment for further refills 90 tablet 3   B Complex-C-Zn-Folic Acid (DIALYVITE 800 WITH ZINC) 0.8 MG TABS Take 1 tablet by mouth daily.     carvedilol (COREG) 3.125 MG tablet Take 1 tablet (3.125 mg total) by mouth 2 (two) times daily with a meal. Take on the days you do not have dialysis 60 tablet 1   Cholecalciferol (VITAMIN D-3 PO) Take 1 capsule by mouth every Monday, Wednesday, and Friday with hemodialysis.     colestipol (COLESTID) 1 g tablet Take 1 g by mouth See admin instructions. 1 tablet (1g) prior to dialysis M-W-F + 1 tablet daily PRN diarrhea     diclofenac sodium (VOLTAREN) 1 % GEL Apply 2 g topically 4 (four) times daily as needed (for hand pain).     ferrous sulfate 325 (65 FE) MG tablet Take 1 tablet (325 mg total) by mouth daily with breakfast. (Patient not taking: Reported on 06/21/2022)  3   folic acid (FOLVITE) 1 MG tablet Take 1 tablet (1 mg total) by mouth daily.     hydrALAZINE (APRESOLINE) 50 MG tablet Take 0.5 tablets (25 mg total) by mouth 3 (three) times daily. Take on non dialysis days 144 tablet 3   insulin aspart (NOVOLOG) 100 UNIT/ML injection Inject 0-6 Units into the skin 3 (three) times daily with meals. 10 mL 11   lidocaine-prilocaine (EMLA) cream Apply 1 Application topically every Monday, Wednesday, and Friday with hemodialysis.     lipase/protease/amylase (CREON) 36000 UNITS CPEP capsule Take 36,000 Units by mouth 2 (two) times daily with a meal.     midodrine (PROAMATINE) 10 MG tablet Take 10 mg on dialysis days Mon-Wed-Fri     olopatadine (PATADAY) 0.1 % ophthalmic solution Place 1 drop into both eyes 2 (two) times daily.      pantoprazole (PROTONIX) 40 MG tablet Take 1 tablet (40 mg total) by mouth daily. (Patient not taking: Reported on 06/21/2022) 30 tablet 0   RESTASIS 0.05 % ophthalmic emulsion 1 drop 2 (two) times daily.     rOPINIRole (REQUIP) 0.25 MG tablet Take 0.25 mg by mouth at bedtime.     SENSIPAR 30 MG tablet Take by mouth.     sevelamer carbonate (RENVELA) 800 MG tablet Take 1,600 mg by mouth 3 (three) times daily with meals.     XIIDRA 5 % SOLN Place 1 drop into both eyes at bedtime.     No current facility-administered medications for this visit.    PHYSICAL EXAMINATION:  ECOG PERFORMANCE STATUS: 1 - Symptomatic but completely ambulatory   There were no vitals filed for this visit.   There were no vitals filed for this visit.    Physical Exam Vitals and nursing note reviewed.  Constitutional:      Appearance: Normal appearance. She is not toxic-appearing or diaphoretic.     Comments: Here with son.    HENT:     Head: Normocephalic and atraumatic.     Right Ear: External ear normal.     Left Ear: External ear normal.     Nose: Nose normal. No congestion or rhinorrhea.  Eyes:     General: No scleral icterus.    Extraocular Movements: Extraocular movements intact.     Conjunctiva/sclera: Conjunctivae normal.     Pupils: Pupils are equal, round, and reactive to light.  Cardiovascular:     Rate and Rhythm: Normal rate.     Heart sounds: No murmur heard.  No friction rub. No gallop.  Abdominal:     General: Bowel sounds are normal.     Palpations: Abdomen is soft.  Musculoskeletal:        General: No swelling, tenderness or deformity.     Cervical back: Normal range of motion and neck supple. No rigidity or tenderness.  Lymphadenopathy:     Head:     Right side of head: No submental, submandibular, tonsillar, preauricular, posterior auricular or occipital adenopathy.     Left side of head: No submental, submandibular, tonsillar, preauricular, posterior auricular or occipital  adenopathy.     Cervical: No cervical adenopathy.     Right cervical: No superficial, deep or posterior cervical adenopathy.    Left cervical: No superficial, deep or posterior cervical adenopathy.     Upper Body:     Right upper body: No supraclavicular, axillary, pectoral or epitrochlear adenopathy.     Left upper body: No supraclavicular, axillary, pectoral or epitrochlear adenopathy.  Skin:    General: Skin is warm.     Coloration: Skin is not jaundiced.  Neurological:     General: No focal deficit present.     Mental Status: She is alert and oriented to person, place, and time.     Cranial Nerves: No cranial nerve deficit.  Psychiatric:        Mood and Affect: Mood normal.        Behavior: Behavior normal.        Thought Content: Thought content normal.        Judgment: Judgment normal.     LABORATORY DATA: I have personally reviewed the data as listed:  Appointment on 06/21/2022  Component Date Value Ref Range Status   Zinc 06/21/2022 104  44 - 115 ug/dL Final   Comment: (NOTE) This test was developed and its performance characteristics determined by [facility]. It has not been cleared or approved by the Food and Drug Administration.                                Detection Limit = 5 Performed At: [facility] [address] [place], [place] [id number] Jolene Schimke MD [id number]    DAT, complement 06/21/2022 NEG   Final   DAT, IgG 06/21/2022    Final                   Value:NEG Performed at [facility], [address] [place], [place] [address]    Copper 06/21/2022 65 (L)  80 - 158 ug/dL Final   Comment: (NOTE) This test was developed and its performance characteristics determined by [facility]. It has not been cleared or approved by the Food and Drug Administration.                                Detection Limit = 5 Performed At: [facility] [address] [place], [place] [id number] Jolene Schimke MD [id number]     Vitamin B-12 06/21/2022 286  180 - 914 pg/mL Final   Comment: (NOTE) This assay is not validated for testing neonatal or myeloproliferative syndrome specimens for Vitamin B12 levels. Performed at [facility], [address] [place], [place] [address]    Haptoglobin 06/21/2022 241  41 - 333 mg/dL Final   Comment: (NOTE) Performed At: [facility] [address] [place], [place] [id number] Jolene Schimke  MD [id number]    Folate 06/21/2022 30.2  >5.9 ng/mL Final   Comment: RESULT CONFIRMED BY MANUAL DILUTION Performed at [facility], [address], [place], [place] [address]    Ferritin 06/21/2022 1,008 (H)  11 - 307 ng/mL Final   Performed at [facility], [address] [place], [place] [address]   WBC Count 06/21/2022 10.0  4.0 - 10.5 K/uL Final   RBC 06/21/2022 3.56 (L)  3.87 - 5.11 MIL/uL Final   Hemoglobin 06/21/2022 10.2 (L)  12.0 - 15.0 g/dL Final   HCT 56/21/3086 33.2 (L)  36.0 - 46.0 % Final   MCV 06/21/2022 93.3  80.0 - 100.0 fL Final   MCH 06/21/2022 28.7  26.0 - 34.0 pg Final   MCHC 06/21/2022 30.7  30.0 - 36.0 g/dL Final   RDW 57/84/6962 16.1 (H)  11.5 - 15.5 % Final   Platelet Count 06/21/2022 311  150 - 400 K/uL Final   nRBC 06/21/2022 0.4 (H)  0.0 - 0.2 % Final   Neutrophils Relative % 06/21/2022 84  % Final   Neutro Abs 06/21/2022 8.5 (H)  1.7 - 7.7 K/uL Final   Lymphocytes Relative 06/21/2022 8  % Final   Lymphs Abs 06/21/2022 0.8  0.7 - 4.0 K/uL Final   Monocytes Relative 06/21/2022 5  % Final   Monocytes Absolute 06/21/2022 0.5  0.1 - 1.0 K/uL Final   Eosinophils Relative 06/21/2022 2  % Final   Eosinophils Absolute 06/21/2022 0.2  0.0 - 0.5 K/uL Final   Basophils Relative 06/21/2022 0  % Final   Basophils Absolute 06/21/2022 0.0  0.0 - 0.1 K/uL Final   Immature Granulocytes 06/21/2022 1  % Final   Abs Immature Granulocytes 06/21/2022 0.07  0.00 - 0.07 K/uL Final   Performed at [facility], [address] [place], [place] [address]   Sodium 06/21/2022 140  135 - 145 mmol/L Final   Potassium 06/21/2022 3.1 (L)  3.5 - 5.1 mmol/L Final   Chloride 06/21/2022 97 (L)  98 - 111 mmol/L Final   CO2 06/21/2022 36 (H)  22 - 32 mmol/L Final   Glucose, Bld 06/21/2022 173 (H)  70 - 99 mg/dL Final   Glucose reference range applies only to samples taken after fasting for at least 8 hours.   BUN 06/21/2022 11  8 - 23 mg/dL Final   Creatinine 13/24/4010 2.37 (H)  0.44 - 1.00 mg/dL Final   Calcium 27/25/3664 7.6 (L)  8.9 - 10.3 mg/dL Final   Total Protein 40/34/7425 6.0 (L)  6.5 - 8.1 g/dL Final   Albumin 95/63/8756 2.8 (L)  3.5 - 5.0 g/dL Final   AST 43/32/9518 19  15 - 41 U/L Final   ALT 06/21/2022 21  0 - 44 U/L Final   Alkaline Phosphatase 06/21/2022 98  38 - 126 U/L Final   Total Bilirubin 06/21/2022 0.3  0.3 - 1.2 mg/dL Final   GFR, Estimated 06/21/2022 20 (L)  >60 mL/min Final   Comment: (NOTE) Calculated using the CKD-EPI Creatinine Equation (2021)    Anion gap 06/21/2022 7  5 - 15 Final   Performed at [facility], [address], [place], [place] [address]    RADIOGRAPHIC STUDIES: I have personally reviewed the radiological images as listed and agree with the findings in the report  No results found.  ASSESSMENT/PLAN  87 y.o. female is here because of   anemia.  Medical history notable for  end-stage renal disease on dialysis, bradycardia, atrial fibrillation on chronic anticoagulation, left renal cell carcinoma, coronary artery disease, left perinephric abscess in September 2023, left renal subcapsular hematoma in December 2023 which required percutaneous drainage and holding of Eliquis  Anemia:   Etiology multifactorial  1) CKD 2) Chronic inflammation     Jun 21 2022- Nutritional and iron stores adequate.  No evidence of hemolysis Jul 12 2022- Anemia essentially repaired and in fact likely over-corrected with ESA  therapy administered under direction by nephrology.   No further action or investigations needed by Hematology     Cancer Staging  No matching staging information was found for the patient.   No problem-specific Assessment & Plan notes found for this encounter.    No orders of the defined types were placed in this encounter.   30  minutes was spent in patient care.  This included time spent preparing to see the patient (e.g., review of tests), obtaining and/or reviewing separately obtained history, counseling and educating the patient/family/caregiver, ordering medications, tests, or procedures; documenting clinical information in the electronic or other health record, independently interpreting results and communicating results to the patient/family/caregiver as well as coordination of care.       All questions were answered. The patient knows to call the clinic with any problems, questions or concerns.  This note was electronically signed.    Loni Muse, MD  07/11/2022 11:46 AM

## 2022-07-12 ENCOUNTER — Inpatient Hospital Stay: Payer: Medicare PPO | Admitting: Oncology

## 2022-07-12 ENCOUNTER — Other Ambulatory Visit: Payer: Self-pay

## 2022-07-12 VITALS — BP 160/84 | HR 88 | Temp 97.9°F | Resp 16 | Ht 63.0 in | Wt 126.6 lb

## 2022-07-12 DIAGNOSIS — Z79899 Other long term (current) drug therapy: Secondary | ICD-10-CM | POA: Diagnosis not present

## 2022-07-12 DIAGNOSIS — I4891 Unspecified atrial fibrillation: Secondary | ICD-10-CM | POA: Diagnosis not present

## 2022-07-12 DIAGNOSIS — D649 Anemia, unspecified: Secondary | ICD-10-CM | POA: Diagnosis not present

## 2022-07-12 DIAGNOSIS — L299 Pruritus, unspecified: Secondary | ICD-10-CM | POA: Diagnosis not present

## 2022-07-12 DIAGNOSIS — Z992 Dependence on renal dialysis: Secondary | ICD-10-CM

## 2022-07-12 DIAGNOSIS — E1122 Type 2 diabetes mellitus with diabetic chronic kidney disease: Secondary | ICD-10-CM | POA: Diagnosis not present

## 2022-07-12 DIAGNOSIS — Z794 Long term (current) use of insulin: Secondary | ICD-10-CM | POA: Diagnosis not present

## 2022-07-12 DIAGNOSIS — N186 End stage renal disease: Secondary | ICD-10-CM

## 2022-07-12 DIAGNOSIS — Z85528 Personal history of other malignant neoplasm of kidney: Secondary | ICD-10-CM | POA: Diagnosis not present

## 2022-07-12 DIAGNOSIS — N2581 Secondary hyperparathyroidism of renal origin: Secondary | ICD-10-CM | POA: Diagnosis not present

## 2022-07-12 DIAGNOSIS — Z7901 Long term (current) use of anticoagulants: Secondary | ICD-10-CM | POA: Diagnosis not present

## 2022-07-12 DIAGNOSIS — D631 Anemia in chronic kidney disease: Secondary | ICD-10-CM | POA: Diagnosis not present

## 2022-07-12 DIAGNOSIS — I2510 Atherosclerotic heart disease of native coronary artery without angina pectoris: Secondary | ICD-10-CM | POA: Diagnosis not present

## 2022-07-15 DIAGNOSIS — Z992 Dependence on renal dialysis: Secondary | ICD-10-CM | POA: Diagnosis not present

## 2022-07-15 DIAGNOSIS — N186 End stage renal disease: Secondary | ICD-10-CM | POA: Diagnosis not present

## 2022-07-16 ENCOUNTER — Encounter (HOSPITAL_COMMUNITY): Payer: Self-pay

## 2022-07-16 DIAGNOSIS — D649 Anemia, unspecified: Secondary | ICD-10-CM | POA: Insufficient documentation

## 2022-07-17 DIAGNOSIS — N2581 Secondary hyperparathyroidism of renal origin: Secondary | ICD-10-CM | POA: Diagnosis not present

## 2022-07-17 DIAGNOSIS — Z992 Dependence on renal dialysis: Secondary | ICD-10-CM | POA: Diagnosis not present

## 2022-07-17 DIAGNOSIS — L299 Pruritus, unspecified: Secondary | ICD-10-CM | POA: Diagnosis not present

## 2022-07-17 DIAGNOSIS — N186 End stage renal disease: Secondary | ICD-10-CM | POA: Diagnosis not present

## 2022-07-18 DIAGNOSIS — N130 Hydronephrosis with ureteropelvic junction obstruction: Secondary | ICD-10-CM | POA: Diagnosis not present

## 2022-07-19 DIAGNOSIS — L299 Pruritus, unspecified: Secondary | ICD-10-CM | POA: Diagnosis not present

## 2022-07-19 DIAGNOSIS — E109 Type 1 diabetes mellitus without complications: Secondary | ICD-10-CM | POA: Diagnosis not present

## 2022-07-19 DIAGNOSIS — N2581 Secondary hyperparathyroidism of renal origin: Secondary | ICD-10-CM | POA: Diagnosis not present

## 2022-07-19 DIAGNOSIS — I158 Other secondary hypertension: Secondary | ICD-10-CM | POA: Diagnosis not present

## 2022-07-19 DIAGNOSIS — N186 End stage renal disease: Secondary | ICD-10-CM | POA: Diagnosis not present

## 2022-07-19 DIAGNOSIS — Z992 Dependence on renal dialysis: Secondary | ICD-10-CM | POA: Diagnosis not present

## 2022-07-22 DIAGNOSIS — N186 End stage renal disease: Secondary | ICD-10-CM | POA: Diagnosis not present

## 2022-07-22 DIAGNOSIS — N2581 Secondary hyperparathyroidism of renal origin: Secondary | ICD-10-CM | POA: Diagnosis not present

## 2022-07-22 DIAGNOSIS — Z992 Dependence on renal dialysis: Secondary | ICD-10-CM | POA: Diagnosis not present

## 2022-07-22 DIAGNOSIS — L299 Pruritus, unspecified: Secondary | ICD-10-CM | POA: Diagnosis not present

## 2022-07-24 DIAGNOSIS — N186 End stage renal disease: Secondary | ICD-10-CM | POA: Diagnosis not present

## 2022-07-24 DIAGNOSIS — L299 Pruritus, unspecified: Secondary | ICD-10-CM | POA: Diagnosis not present

## 2022-07-24 DIAGNOSIS — Z992 Dependence on renal dialysis: Secondary | ICD-10-CM | POA: Diagnosis not present

## 2022-07-24 DIAGNOSIS — N2581 Secondary hyperparathyroidism of renal origin: Secondary | ICD-10-CM | POA: Diagnosis not present

## 2022-07-25 DIAGNOSIS — I132 Hypertensive heart and chronic kidney disease with heart failure and with stage 5 chronic kidney disease, or end stage renal disease: Secondary | ICD-10-CM | POA: Diagnosis not present

## 2022-07-25 DIAGNOSIS — I4891 Unspecified atrial fibrillation: Secondary | ICD-10-CM | POA: Diagnosis not present

## 2022-07-25 DIAGNOSIS — E1022 Type 1 diabetes mellitus with diabetic chronic kidney disease: Secondary | ICD-10-CM | POA: Diagnosis not present

## 2022-07-25 DIAGNOSIS — I509 Heart failure, unspecified: Secondary | ICD-10-CM | POA: Diagnosis not present

## 2022-07-25 DIAGNOSIS — N186 End stage renal disease: Secondary | ICD-10-CM | POA: Diagnosis not present

## 2022-07-25 DIAGNOSIS — Z992 Dependence on renal dialysis: Secondary | ICD-10-CM | POA: Diagnosis not present

## 2022-07-25 DIAGNOSIS — E213 Hyperparathyroidism, unspecified: Secondary | ICD-10-CM | POA: Diagnosis not present

## 2022-07-25 DIAGNOSIS — D631 Anemia in chronic kidney disease: Secondary | ICD-10-CM | POA: Diagnosis not present

## 2022-07-25 DIAGNOSIS — E559 Vitamin D deficiency, unspecified: Secondary | ICD-10-CM | POA: Diagnosis not present

## 2022-07-26 DIAGNOSIS — Z992 Dependence on renal dialysis: Secondary | ICD-10-CM | POA: Diagnosis not present

## 2022-07-26 DIAGNOSIS — N186 End stage renal disease: Secondary | ICD-10-CM | POA: Diagnosis not present

## 2022-07-26 DIAGNOSIS — N2581 Secondary hyperparathyroidism of renal origin: Secondary | ICD-10-CM | POA: Diagnosis not present

## 2022-07-26 DIAGNOSIS — L299 Pruritus, unspecified: Secondary | ICD-10-CM | POA: Diagnosis not present

## 2022-07-29 DIAGNOSIS — N186 End stage renal disease: Secondary | ICD-10-CM | POA: Diagnosis not present

## 2022-07-29 DIAGNOSIS — Z992 Dependence on renal dialysis: Secondary | ICD-10-CM | POA: Diagnosis not present

## 2022-07-29 DIAGNOSIS — L299 Pruritus, unspecified: Secondary | ICD-10-CM | POA: Diagnosis not present

## 2022-07-29 DIAGNOSIS — N2581 Secondary hyperparathyroidism of renal origin: Secondary | ICD-10-CM | POA: Diagnosis not present

## 2022-07-31 DIAGNOSIS — M180 Bilateral primary osteoarthritis of first carpometacarpal joints: Secondary | ICD-10-CM | POA: Diagnosis not present

## 2022-07-31 DIAGNOSIS — L299 Pruritus, unspecified: Secondary | ICD-10-CM | POA: Diagnosis not present

## 2022-07-31 DIAGNOSIS — G5603 Carpal tunnel syndrome, bilateral upper limbs: Secondary | ICD-10-CM | POA: Diagnosis not present

## 2022-07-31 DIAGNOSIS — N2581 Secondary hyperparathyroidism of renal origin: Secondary | ICD-10-CM | POA: Diagnosis not present

## 2022-07-31 DIAGNOSIS — N186 End stage renal disease: Secondary | ICD-10-CM | POA: Diagnosis not present

## 2022-07-31 DIAGNOSIS — Z992 Dependence on renal dialysis: Secondary | ICD-10-CM | POA: Diagnosis not present

## 2022-08-01 DIAGNOSIS — I509 Heart failure, unspecified: Secondary | ICD-10-CM | POA: Diagnosis not present

## 2022-08-01 DIAGNOSIS — E213 Hyperparathyroidism, unspecified: Secondary | ICD-10-CM | POA: Diagnosis not present

## 2022-08-01 DIAGNOSIS — Z992 Dependence on renal dialysis: Secondary | ICD-10-CM | POA: Diagnosis not present

## 2022-08-01 DIAGNOSIS — I4891 Unspecified atrial fibrillation: Secondary | ICD-10-CM | POA: Diagnosis not present

## 2022-08-01 DIAGNOSIS — D631 Anemia in chronic kidney disease: Secondary | ICD-10-CM | POA: Diagnosis not present

## 2022-08-01 DIAGNOSIS — E1022 Type 1 diabetes mellitus with diabetic chronic kidney disease: Secondary | ICD-10-CM | POA: Diagnosis not present

## 2022-08-01 DIAGNOSIS — N186 End stage renal disease: Secondary | ICD-10-CM | POA: Diagnosis not present

## 2022-08-01 DIAGNOSIS — I132 Hypertensive heart and chronic kidney disease with heart failure and with stage 5 chronic kidney disease, or end stage renal disease: Secondary | ICD-10-CM | POA: Diagnosis not present

## 2022-08-01 DIAGNOSIS — E559 Vitamin D deficiency, unspecified: Secondary | ICD-10-CM | POA: Diagnosis not present

## 2022-08-02 DIAGNOSIS — N2581 Secondary hyperparathyroidism of renal origin: Secondary | ICD-10-CM | POA: Diagnosis not present

## 2022-08-02 DIAGNOSIS — L299 Pruritus, unspecified: Secondary | ICD-10-CM | POA: Diagnosis not present

## 2022-08-02 DIAGNOSIS — N186 End stage renal disease: Secondary | ICD-10-CM | POA: Diagnosis not present

## 2022-08-02 DIAGNOSIS — Z992 Dependence on renal dialysis: Secondary | ICD-10-CM | POA: Diagnosis not present

## 2022-08-05 DIAGNOSIS — N186 End stage renal disease: Secondary | ICD-10-CM | POA: Diagnosis not present

## 2022-08-05 DIAGNOSIS — N2581 Secondary hyperparathyroidism of renal origin: Secondary | ICD-10-CM | POA: Diagnosis not present

## 2022-08-05 DIAGNOSIS — Z992 Dependence on renal dialysis: Secondary | ICD-10-CM | POA: Diagnosis not present

## 2022-08-05 DIAGNOSIS — L299 Pruritus, unspecified: Secondary | ICD-10-CM | POA: Diagnosis not present

## 2022-08-06 DIAGNOSIS — N186 End stage renal disease: Secondary | ICD-10-CM | POA: Diagnosis not present

## 2022-08-06 DIAGNOSIS — I509 Heart failure, unspecified: Secondary | ICD-10-CM | POA: Diagnosis not present

## 2022-08-06 DIAGNOSIS — I4891 Unspecified atrial fibrillation: Secondary | ICD-10-CM | POA: Diagnosis not present

## 2022-08-06 DIAGNOSIS — E213 Hyperparathyroidism, unspecified: Secondary | ICD-10-CM | POA: Diagnosis not present

## 2022-08-06 DIAGNOSIS — D631 Anemia in chronic kidney disease: Secondary | ICD-10-CM | POA: Diagnosis not present

## 2022-08-06 DIAGNOSIS — I132 Hypertensive heart and chronic kidney disease with heart failure and with stage 5 chronic kidney disease, or end stage renal disease: Secondary | ICD-10-CM | POA: Diagnosis not present

## 2022-08-06 DIAGNOSIS — E1022 Type 1 diabetes mellitus with diabetic chronic kidney disease: Secondary | ICD-10-CM | POA: Diagnosis not present

## 2022-08-06 DIAGNOSIS — Z992 Dependence on renal dialysis: Secondary | ICD-10-CM | POA: Diagnosis not present

## 2022-08-06 DIAGNOSIS — E559 Vitamin D deficiency, unspecified: Secondary | ICD-10-CM | POA: Diagnosis not present

## 2022-08-07 DIAGNOSIS — Z992 Dependence on renal dialysis: Secondary | ICD-10-CM | POA: Diagnosis not present

## 2022-08-07 DIAGNOSIS — L299 Pruritus, unspecified: Secondary | ICD-10-CM | POA: Diagnosis not present

## 2022-08-07 DIAGNOSIS — N2581 Secondary hyperparathyroidism of renal origin: Secondary | ICD-10-CM | POA: Diagnosis not present

## 2022-08-07 DIAGNOSIS — N186 End stage renal disease: Secondary | ICD-10-CM | POA: Diagnosis not present

## 2022-08-08 DIAGNOSIS — R600 Localized edema: Secondary | ICD-10-CM | POA: Diagnosis not present

## 2022-08-08 DIAGNOSIS — Z992 Dependence on renal dialysis: Secondary | ICD-10-CM | POA: Diagnosis not present

## 2022-08-08 DIAGNOSIS — N186 End stage renal disease: Secondary | ICD-10-CM | POA: Diagnosis not present

## 2022-08-08 DIAGNOSIS — I10 Essential (primary) hypertension: Secondary | ICD-10-CM | POA: Diagnosis not present

## 2022-08-08 DIAGNOSIS — E782 Mixed hyperlipidemia: Secondary | ICD-10-CM | POA: Diagnosis not present

## 2022-08-08 DIAGNOSIS — E039 Hypothyroidism, unspecified: Secondary | ICD-10-CM | POA: Diagnosis not present

## 2022-08-08 DIAGNOSIS — I509 Heart failure, unspecified: Secondary | ICD-10-CM | POA: Diagnosis not present

## 2022-08-08 DIAGNOSIS — D509 Iron deficiency anemia, unspecified: Secondary | ICD-10-CM | POA: Diagnosis not present

## 2022-08-09 ENCOUNTER — Ambulatory Visit
Admission: RE | Admit: 2022-08-09 | Discharge: 2022-08-09 | Disposition: A | Discharge status: Home / Self Care | Payer: Medicare PPO | Source: Ambulatory Visit | Attending: Family Medicine | Admitting: Family Medicine

## 2022-08-09 ENCOUNTER — Other Ambulatory Visit: Payer: Self-pay | Admitting: Family Medicine

## 2022-08-09 DIAGNOSIS — I509 Heart failure, unspecified: Secondary | ICD-10-CM

## 2022-08-09 DIAGNOSIS — D631 Anemia in chronic kidney disease: Secondary | ICD-10-CM | POA: Diagnosis not present

## 2022-08-09 DIAGNOSIS — E213 Hyperparathyroidism, unspecified: Secondary | ICD-10-CM | POA: Diagnosis not present

## 2022-08-09 DIAGNOSIS — I700 Atherosclerosis of aorta: Secondary | ICD-10-CM | POA: Diagnosis not present

## 2022-08-09 DIAGNOSIS — N186 End stage renal disease: Secondary | ICD-10-CM | POA: Diagnosis not present

## 2022-08-09 DIAGNOSIS — Z992 Dependence on renal dialysis: Secondary | ICD-10-CM | POA: Diagnosis not present

## 2022-08-09 DIAGNOSIS — I132 Hypertensive heart and chronic kidney disease with heart failure and with stage 5 chronic kidney disease, or end stage renal disease: Secondary | ICD-10-CM | POA: Diagnosis not present

## 2022-08-09 DIAGNOSIS — E559 Vitamin D deficiency, unspecified: Secondary | ICD-10-CM | POA: Diagnosis not present

## 2022-08-09 DIAGNOSIS — L299 Pruritus, unspecified: Secondary | ICD-10-CM | POA: Diagnosis not present

## 2022-08-09 DIAGNOSIS — I4891 Unspecified atrial fibrillation: Secondary | ICD-10-CM | POA: Diagnosis not present

## 2022-08-09 DIAGNOSIS — I5022 Chronic systolic (congestive) heart failure: Secondary | ICD-10-CM | POA: Diagnosis not present

## 2022-08-09 DIAGNOSIS — E1022 Type 1 diabetes mellitus with diabetic chronic kidney disease: Secondary | ICD-10-CM | POA: Diagnosis not present

## 2022-08-09 DIAGNOSIS — N2581 Secondary hyperparathyroidism of renal origin: Secondary | ICD-10-CM | POA: Diagnosis not present

## 2022-08-10 ENCOUNTER — Other Ambulatory Visit: Payer: Self-pay | Admitting: Cardiology

## 2022-08-12 DIAGNOSIS — E109 Type 1 diabetes mellitus without complications: Secondary | ICD-10-CM | POA: Diagnosis not present

## 2022-08-12 DIAGNOSIS — D631 Anemia in chronic kidney disease: Secondary | ICD-10-CM | POA: Diagnosis not present

## 2022-08-12 DIAGNOSIS — R600 Localized edema: Secondary | ICD-10-CM | POA: Diagnosis not present

## 2022-08-12 DIAGNOSIS — S21202D Unspecified open wound of left back wall of thorax without penetration into thoracic cavity, subsequent encounter: Secondary | ICD-10-CM | POA: Diagnosis not present

## 2022-08-12 DIAGNOSIS — I509 Heart failure, unspecified: Secondary | ICD-10-CM | POA: Diagnosis not present

## 2022-08-12 DIAGNOSIS — Z992 Dependence on renal dialysis: Secondary | ICD-10-CM | POA: Diagnosis not present

## 2022-08-12 DIAGNOSIS — N185 Chronic kidney disease, stage 5: Secondary | ICD-10-CM | POA: Diagnosis not present

## 2022-08-12 DIAGNOSIS — N186 End stage renal disease: Secondary | ICD-10-CM | POA: Diagnosis not present

## 2022-08-13 DIAGNOSIS — N186 End stage renal disease: Secondary | ICD-10-CM | POA: Diagnosis not present

## 2022-08-13 DIAGNOSIS — N2581 Secondary hyperparathyroidism of renal origin: Secondary | ICD-10-CM | POA: Diagnosis not present

## 2022-08-13 DIAGNOSIS — Z992 Dependence on renal dialysis: Secondary | ICD-10-CM | POA: Diagnosis not present

## 2022-08-15 DIAGNOSIS — Z992 Dependence on renal dialysis: Secondary | ICD-10-CM | POA: Diagnosis not present

## 2022-08-15 DIAGNOSIS — N2581 Secondary hyperparathyroidism of renal origin: Secondary | ICD-10-CM | POA: Diagnosis not present

## 2022-08-15 DIAGNOSIS — N186 End stage renal disease: Secondary | ICD-10-CM | POA: Diagnosis not present

## 2022-08-15 DIAGNOSIS — I509 Heart failure, unspecified: Secondary | ICD-10-CM | POA: Diagnosis not present

## 2022-08-15 DIAGNOSIS — I10 Essential (primary) hypertension: Secondary | ICD-10-CM | POA: Diagnosis not present

## 2022-08-17 DIAGNOSIS — L299 Pruritus, unspecified: Secondary | ICD-10-CM | POA: Diagnosis not present

## 2022-08-17 DIAGNOSIS — Z992 Dependence on renal dialysis: Secondary | ICD-10-CM | POA: Diagnosis not present

## 2022-08-17 DIAGNOSIS — N186 End stage renal disease: Secondary | ICD-10-CM | POA: Diagnosis not present

## 2022-08-17 DIAGNOSIS — N2581 Secondary hyperparathyroidism of renal origin: Secondary | ICD-10-CM | POA: Diagnosis not present

## 2022-08-18 DIAGNOSIS — I158 Other secondary hypertension: Secondary | ICD-10-CM | POA: Diagnosis not present

## 2022-08-18 DIAGNOSIS — N186 End stage renal disease: Secondary | ICD-10-CM | POA: Diagnosis not present

## 2022-08-18 DIAGNOSIS — Z992 Dependence on renal dialysis: Secondary | ICD-10-CM | POA: Diagnosis not present

## 2022-08-18 NOTE — Progress Notes (Unsigned)
Cardiology Office Note   Date:  08/20/2022   ID:  Burns, Kim 18-Nov-1935, MRN [id number]  PCP:  Lewis Moccasin, MD  Cardiologist:   Bralyn Folkert [place], MD   No chief complaint on file.     History of Present Illness: Kim Burns is a 87 y.o. female who is seen for follow up Afib and orthostatic hypotension. She has a history of CKD and HTN. Echo in 2015 showed LVH with normal systolic function. She had a Whipple procedure in 2014 with pancreatectomy and partial bowel resection. She developed ESRD felt to be related to DM and HTN. She has been on hemodialysis.  With dialysis she has developed orthostatic hypotension that is worse on dialysis days. Her antihypertensives were discontinued and she was placed on  midodrine on dialysis days.  She developed  new onset atrial fibrillation with RVR during hospitalization in early July 2023 after an ablation procedure for left renal cell carcinoma. She was started on rate control agents as well as Eliquis for stroke prophylaxis.  She had spontaneously converted back to sinus rhythm prior to discharge after receiving IV Cardizem. CTA of abdomen and chest did not show any evidence of PE, 3 mm left pulmonary nodule noted, stable solid mass in the medial aspect of the left kidney.  Diagnostic cardiac catheterization on 7/10 due to elevated troponins on 7/8 revealed nonobstructive disease with 20% proximal RCA, 20% left circumflex artery, 20% mid left main, 30% ostial LAD, 50% proximal LAD, EF 55 to 65%.  Repeat echo obtained on the same day revealed LVEF of 60 to 65%, moderately reduced RV systolic function, grade 3 diastolic dysfunction, RSVP 46.1 mmHg, severe biatrial enlargement, mild MR, mild to moderate TR.    She was admitted 9/11-9/17/23 with acute sepsis. CT abdomen and pelvis in ED showed interval development of post ablation changes to left kidney with additional interval development of severe hydronephrosis with obstruction of the  proximal ureter.  She also had lactic acidosis and leukocytosis.  Cultures obtained.  She was started on vancomycin and Zosyn. Blood culture with Streptococcus gallolyticus in 1 out of 2 bottles.  Patient had left ureteral stent placement by urology on 9/13, and drain placed for left perinephric abscess by IR on 9/15.   She was admitted on Dec 13 with severe flank pain. Hgb down to 7.5. She has a history of renal cell mass status post nephrostomy tube, ureteral stent. CT renal study showed large left renal subcapsular hematoma extending into the perirenal space left pigtail ureteral stent in place, no hydronephrosis. Eliquis was held. Urology consulted and observation recommended.   On follow up today she is seen with her daughter. She has some minor bleeding from her nephrostomy but mostly clear.  No documented Afib. HR stable. She reports her BP is still dropping about 45 minutes into dialysis. Is only on low dose Coreg which she skips on dialysis days. BP typically in 150s systolic. Hydralazine discontinued. Is taking midodrine on dialysis days. She feels really wiped out after dialysis. She has SOB. She gets periodic iron transfusions and is on Aranesp. She was seen by Dr Duanne Guess recently who checked a BNP level which was elevated 1130. Was placed on lasix 3 times a day but has noted no change in urine output which is only about a cup a day. Has LE edema. CXR done and lungs clear.   Past Medical History:  Diagnosis Date   Anemia of chronic disease  takes iron   Anxiety    Blood transfusion without reported diagnosis    Bradycardia    Diabetes mellitus without complication (HCC)    became diabetic after Whipple procedure   Diarrhea    Dysrhythmia 04/16/2016   bradycardia due to medication    ESRD on hemodialysis [facility])    M-W-F   GERD (gastroesophageal reflux disease)    Gout    Headache    History of kidney stones 06/2013   Hyperparathyroidism (HCC)    Hypertension    PONV (postoperative  nausea and vomiting)    Sleep apnea    no cpap machine. could not tolerate   Stroke (HCC) 04/16/2016   TIA 1995   Vitamin D deficiency     Past Surgical History:  Procedure Laterality Date   A/V FISTULAGRAM Left 08/30/2021   Procedure: A/V Fistulagram;  Surgeon: Cephus Shelling, MD;  Location: [facility] INVASIVE CV LAB;  Service: Cardiovascular;  Laterality: Left;   ABDOMINAL HYSTERECTOMY  1985   complete   BACK SURGERY  1980   lower   BASCILIC VEIN TRANSPOSITION Left 04/24/2017   Procedure: LEFT ARM FIRST STAGE BASILIC VEIN TRANSPOSITION;  Surgeon: Nada Libman, MD;  Location: [facility] OR;  Service: Vascular;  Laterality: Left;   BASCILIC VEIN TRANSPOSITION Left 07/10/2017   Procedure: SECOND STAGE BASILIC VEIN TRANSPOSITION LEFT ARM;  Surgeon: Nada Libman, MD;  Location: [facility] OR;  Service: Vascular;  Laterality: Left;   BREAST LUMPECTOMY Left x 2   many years apart, benign   CHOLECYSTECTOMY  1985   CYSTOSCOPY W/ URETERAL STENT PLACEMENT Left 10/31/2021   Procedure: CYSTOSCOPY WITH RETROGRADE PYELOGRAM/URETERAL STENT PLACEMENT;  Surgeon: Crist Fat, MD;  Location: [facility] OR;  Service: Urology;  Laterality: Left;   CYSTOSCOPY WITH URETEROSCOPY AND STENT PLACEMENT Left 06/18/2013   Procedure: CYSTOSCOPY WITH Lef URETEROSCOPY AND Left STENT PLACEMENT;  Surgeon: Crecencio Mc, MD;  Location: [facility] ORS;  Service: Urology;  Laterality: Left;   ESOPHAGOGASTRODUODENOSCOPY (EGD) WITH PROPOFOL N/A 11/13/2016   Procedure: ESOPHAGOGASTRODUODENOSCOPY (EGD) WITH PROPOFOL;  Surgeon: Kathi Der, MD;  Location: [facility] ENDOSCOPY;  Service: Gastroenterology;  Laterality: N/A;   EUS N/A 02/07/2016   Procedure: ESOPHAGEAL ENDOSCOPIC ULTRASOUND (EUS) RADIAL;  Surgeon: Willis Modena, MD;  Location: [facility] ENDOSCOPY;  Service: Endoscopy;  Laterality: N/A;   EYE SURGERY Bilateral 2014   ioc for catracts    FOOT SURGERY Left 1990   something with toes unsure what    HERNIA REPAIR  2018   IR CATHETER TUBE  CHANGE  03/14/2022   IR EMBO TUMOR ORGAN ISCHEMIA INFARCT INC GUIDE ROADMAPPING  08/22/2021   IR NEPHROSTOMY EXCHANGE LEFT  06/27/2022   IR NEPHROSTOMY PLACEMENT LEFT  03/14/2022   IR RADIOLOGIST EVAL & MGMT  05/18/2021   IR RADIOLOGIST EVAL & MGMT  10/01/2021   IR RADIOLOGIST EVAL & MGMT  11/12/2021   IR RADIOLOGIST EVAL & MGMT  11/22/2021   IR RADIOLOGIST EVAL & MGMT  11/27/2021   IR RADIOLOGIST EVAL & MGMT  12/20/2021   IR RENAL SUPRASEL UNI S&I MOD SED  08/22/2021   IR US GUIDE VASC ACCESS RIGHT  08/22/2021   LEFT HEART CATH AND CORONARY ANGIOGRAPHY N/A 08/27/2021   Procedure: LEFT HEART CATH AND CORONARY ANGIOGRAPHY;  Surgeon: Kathleene Hazel, MD;  Location: [facility] INVASIVE CV LAB;  Service: Cardiovascular;  Laterality: N/A;   PARATHYROID EXPLORATION     PERIPHERAL VASCULAR BALLOON ANGIOPLASTY Left 08/30/2021   Procedure: PERIPHERAL VASCULAR BALLOON ANGIOPLASTY;  Surgeon:  Cephus Shelling, MD;  Location: [facility] INVASIVE CV LAB;  Service: Cardiovascular;  Laterality: Left;  arm fistula   RADIOLOGY WITH ANESTHESIA Left 08/22/2021   Procedure: MICROWAVE ABLATION;  Surgeon: Simonne Come, MD;  Location: [facility] ORS;  Service: Anesthesiology;  Laterality: Left;   WHIPPLE PROCEDURE N/A 04/23/2016   Procedure: WHIPPLE PROCEDURE;  Surgeon: Almond Lint, MD;  Location: [facility] OR;  Service: General;  Laterality: N/A;     Current Outpatient Medications  Medication Sig Dispense Refill   acetaminophen (TYLENOL) 325 MG tablet Take 2 tablets (650 mg total) by mouth every 6 (six) hours as needed for mild pain, fever or headache. (Patient taking differently: Take 500 mg by mouth every 6 (six) hours as needed for mild pain, fever or headache.)     amiodarone (PACERONE) 200 MG tablet Take 0.5 tablets (100 mg total) by mouth daily. Please keep appointment for further refills 90 tablet 3   B Complex-C-Zn-Folic Acid (DIALYVITE 800 WITH ZINC) 0.8 MG TABS Take 1 tablet by mouth daily.     carvedilol (COREG) 3.125 MG tablet TAKE 1  TABLET BY MOUTH TWICE DAILY WITH A MEAL .  TAKE  ON  THE  DAYS  YOU  DO  NOT  HAVE  DIALYSIS. 60 tablet 0   Cholecalciferol (VITAMIN D-3 PO) Take 1 capsule by mouth every Monday, Wednesday, and Friday with hemodialysis.     colestipol (COLESTID) 1 g tablet Take 1 g by mouth See admin instructions. 1 tablet (1g) prior to dialysis M-W-F + 1 tablet daily PRN diarrhea     diclofenac sodium (VOLTAREN) 1 % GEL Apply 2 g topically 4 (four) times daily as needed (for hand pain).     folic acid (FOLVITE) 1 MG tablet Take 1 tablet (1 mg total) by mouth daily.     insulin aspart (NOVOLOG) 100 UNIT/ML injection Inject 0-6 Units into the skin 3 (three) times daily with meals. 10 mL 11   lidocaine-prilocaine (EMLA) cream Apply 1 Application topically every Monday, Wednesday, and Friday with hemodialysis.     lipase/protease/amylase (CREON) 36000 UNITS CPEP capsule Take 36,000 Units by mouth 2 (two) times daily with a meal.     midodrine (PROAMATINE) 10 MG tablet Take 10 mg on dialysis days Mon-Wed-Fri     olopatadine (PATADAY) 0.1 % ophthalmic solution Place 1 drop into both eyes 2 (two) times daily.     RESTASIS 0.05 % ophthalmic emulsion 1 drop 2 (two) times daily.     rOPINIRole (REQUIP) 0.25 MG tablet Take 0.25 mg by mouth at bedtime.     SENSIPAR 30 MG tablet Take by mouth.     sevelamer carbonate (RENVELA) 800 MG tablet Take 1,600 mg by mouth 3 (three) times daily with meals.     XIIDRA 5 % SOLN Place 1 drop into both eyes at bedtime.     No current facility-administered medications for this visit.    Allergies:   Lopid [gemfibrozil], Lotemax [loteprednol etabonate], Statins, Norco [hydrocodone-acetaminophen], Other, Tomato, Vibra-tab [doxycycline], Amlodipine, Codeine, Hydrocodone, Hydrocodone-acetaminophen, Lisinopril, and Verapamil    Social History:  The patient  reports that she has never smoked. She has never used smokeless tobacco. She reports that she does not drink alcohol and does not use  drugs.   Family History:  The patient's family history includes Alzheimer's disease in her sister and sister; Cancer in her brother and father; Heart disease in her mother; Stroke in her mother.    ROS:  Please see the history of present  illness.   Otherwise, review of systems are positive for none.   All other systems are reviewed and negative.    PHYSICAL EXAM: VS:  BP 132/78 (BP Location: Right Arm, Patient Position: Sitting, Cuff Size: Normal)   Pulse 76   Ht 5\' 2"  (1.575 m)   Wt 124 lb 6.4 oz (56.4 kg)   SpO2 99%   BMI 22.75 kg/m  , BMI Body mass index is 22.75 kg/m. GEN: chronically ill appearing, in no acute distress.  HEENT: normal Neck: no JVD, carotid bruits, or masses Cardiac: RRR; radiated murmur from her AV fistula. no murmurs, rubs, or gallops,no edema. Functioning AV fistula in left upper arm. 2-3+ LE edema below the knees Respiratory:  clear to auscultation bilaterally, normal work of breathing GI: soft, nontender, nondistended, + BS MS: no deformity or atrophy Skin: warm and dry, no rash Neuro:  Strength and sensation are intact Psych: euthymic mood, full affect   EKG:  EKG is not ordered today.   Recent Labs: 09/05/2021: B Natriuretic Peptide 2,213.9 01/22/2022: Magnesium 2.8 02/25/2022: TSH 2.300 06/21/2022: ALT 21; BUN 11; Creatinine 2.37; Hemoglobin 10.2; Platelet Count 311; Potassium 3.1; Sodium 140    Lipid Panel    Component Value Date/Time   CHOL 95 08/26/2021 0447   CHOL 133 08/09/2021 1143   TRIG 110 08/26/2021 0447   HDL 35 (L) 08/26/2021 0447   HDL 43 08/09/2021 1143   CHOLHDL 2.7 08/26/2021 0447   VLDL 22 08/26/2021 0447   LDLCALC 38 08/26/2021 0447   LDLCALC 68 08/09/2021 1143      Wt Readings from Last 3 Encounters:  08/20/22 124 lb 6.4 oz (56.4 kg)  07/12/22 126 lb 9.6 oz (57.4 kg)  06/21/22 130 lb 9.6 oz (59.2 kg)      Other studies Reviewed: Additional studies/ records that were reviewed today include:   Echo 06/19/13: Study  Conclusions   - Left ventricle: The cavity size was normal. There was    severe focal basal and moderate concentric hypertrophy.    Systolic function was normal. The estimated ejection    fraction was in the range of 60% to 65%. Wall motion was    normal; there were no regional wall motion abnormalities.  - Aortic valve: Valve area: 1.69cm^2 (Vmax).  - Mitral valve: Mild to moderate regurgitation.  - Left atrium: The atrium was mildly dilated.  - Atrial septum: There was increased thickness of the    septum, consistent with lipomatous hypertrophy.   Echo 08/27/21: IMPRESSIONS     1. Right ventricular systolic function is moderately reduced. The right  ventricular size is mildly enlarged. There is moderately elevated  pulmonary artery systolic pressure. The estimated right ventricular  systolic pressure is 46.1 mmHg. Normal apical  function with free wall hypokinesis, consistent with McConnell's sign as  can be seen in acute PE (image 58). Consider evaluation for PE.   2. Left ventricular ejection fraction, by estimation, is 60 to 65%. The  left ventricle has normal function. The left ventricle has no regional  wall motion abnormalities. Left ventricular diastolic parameters are  consistent with Grade III diastolic  dysfunction (restrictive). Elevated left atrial pressure.   3. Left atrial size was severely dilated.   4. Right atrial size was severely dilated.   5. The mitral valve is degenerative. Mild mitral valve regurgitation. No  evidence of mitral stenosis. Moderate mitral annular calcification.   6. Tricuspid valve regurgitation is mild to moderate.   7. The aortic valve  is tricuspid. Aortic valve regurgitation is not  visualized. Aortic valve sclerosis is present, with no evidence of aortic  valve stenosis.   8. The inferior vena cava is dilated in size with <50% respiratory  variability, suggesting right atrial pressure of 15 mmHg.   Cardiac cath 08/27/21:  LEFT HEART  CATH AND CORONARY ANGIOGRAPHY   Conclusion      Prox RCA lesion is 20% stenosed.   Mid LM to Dist LM lesion is 20% stenosed.   Ost LAD lesion is 30% stenosed.   Mid Cx lesion is 20% stenosed.   Prox LAD lesion is 50% stenosed.   The left ventricular systolic function is normal.   LV end diastolic pressure is normal.   The left ventricular ejection fraction is 55-65% by visual estimate.   There is no mitral valve regurgitation.   Mild non-obstructive CAD Normal LV systolic function Elevated troponin and chest pain is likely due to demand ischemic in the setting of rapid atrial fibrillation.    Recommendation: Medical management of CAD.   Coronary Diagrams  Diagnostic Dominance: Right  Interve  ASSESSMENT AND PLAN:  1.  ESRD on hemodialysis. Volume status is elevated today with edema. She is not going to respond to lasix with her ESRD. Recommend she stop lasix. This can only be managed with dialysis and this has been limited by hypotension. Will leave off hydralazine to allow more liberal BP. BNP is predictably elevated with ESRD.  2. Orthostatic hypotension. Now again requiring midodrine on dialysis days. Will continue to monitor. 3. S/p subscapsular renal hematoma- on eliquis- Eliquis was stopped 4. DM 5. History of HTN 6. Renal tumor s/p ablation 7. Recent sepsis with hydronephrosis and abscess. S/p drain- chronic.  8. Nonobstructive CAD 9. Paroxysmal Afib following ablation procedure in July 2023.  Converted on amiodarone. No recurrence. Appears to be in NSR today. Continue low dose amiodarone.       Disposition:   FU 6 months   Signed, Nolon Yellin [place], MD  08/20/2022 2:29 PM    [facility] [address], [place], [place], [address] Phone [phone], Fax [phone]

## 2022-08-19 DIAGNOSIS — Z992 Dependence on renal dialysis: Secondary | ICD-10-CM | POA: Diagnosis not present

## 2022-08-19 DIAGNOSIS — N2581 Secondary hyperparathyroidism of renal origin: Secondary | ICD-10-CM | POA: Diagnosis not present

## 2022-08-19 DIAGNOSIS — N186 End stage renal disease: Secondary | ICD-10-CM | POA: Diagnosis not present

## 2022-08-19 DIAGNOSIS — L299 Pruritus, unspecified: Secondary | ICD-10-CM | POA: Diagnosis not present

## 2022-08-20 ENCOUNTER — Encounter: Payer: Self-pay | Admitting: Cardiology

## 2022-08-20 ENCOUNTER — Ambulatory Visit: Payer: Medicare PPO | Attending: Cardiology | Admitting: Cardiology

## 2022-08-20 VITALS — BP 132/78 | HR 76 | Ht 62.0 in | Wt 124.4 lb

## 2022-08-20 DIAGNOSIS — I480 Paroxysmal atrial fibrillation: Secondary | ICD-10-CM

## 2022-08-20 DIAGNOSIS — I951 Orthostatic hypotension: Secondary | ICD-10-CM | POA: Diagnosis not present

## 2022-08-20 DIAGNOSIS — N186 End stage renal disease: Secondary | ICD-10-CM

## 2022-08-20 NOTE — Patient Instructions (Addendum)
Medication Instructions:  Stop Lasix Stop Potassium supplement Stop Hydralazine Continue all other medications *If you need a refill on your cardiac medications before your next appointment, please call your pharmacy*   Lab Work: None ordered   Testing/Procedures: None ordered   Follow-Up: At [facility], you and your health needs are our priority.  As part of our continuing mission to provide you with exceptional heart care, we have created designated Provider Care Teams.  These Care Teams include your primary Cardiologist (physician) and Advanced Practice Providers (APPs -  Physician Assistants and Nurse Practitioners) who all work together to provide you with the care you need, when you need it.  We recommend signing up for the patient portal called "MyChart".  Sign up information is provided on this After Visit Summary.  MyChart is used to connect with patients for Virtual Visits (Telemedicine).  Patients are able to view lab/test results, encounter notes, upcoming appointments, etc.  Non-urgent messages can be sent to your provider as well.   To learn more about what you can do with MyChart, go to [email or web address].    Your next appointment:  6 months   Call in August to schedule Jan appointment     Provider: Dr.Jordan

## 2022-08-21 DIAGNOSIS — Z992 Dependence on renal dialysis: Secondary | ICD-10-CM | POA: Diagnosis not present

## 2022-08-21 DIAGNOSIS — E1022 Type 1 diabetes mellitus with diabetic chronic kidney disease: Secondary | ICD-10-CM | POA: Diagnosis not present

## 2022-08-21 DIAGNOSIS — E213 Hyperparathyroidism, unspecified: Secondary | ICD-10-CM | POA: Diagnosis not present

## 2022-08-21 DIAGNOSIS — N186 End stage renal disease: Secondary | ICD-10-CM | POA: Diagnosis not present

## 2022-08-21 DIAGNOSIS — N2581 Secondary hyperparathyroidism of renal origin: Secondary | ICD-10-CM | POA: Diagnosis not present

## 2022-08-21 DIAGNOSIS — E559 Vitamin D deficiency, unspecified: Secondary | ICD-10-CM | POA: Diagnosis not present

## 2022-08-21 DIAGNOSIS — I509 Heart failure, unspecified: Secondary | ICD-10-CM | POA: Diagnosis not present

## 2022-08-21 DIAGNOSIS — I4891 Unspecified atrial fibrillation: Secondary | ICD-10-CM | POA: Diagnosis not present

## 2022-08-21 DIAGNOSIS — I132 Hypertensive heart and chronic kidney disease with heart failure and with stage 5 chronic kidney disease, or end stage renal disease: Secondary | ICD-10-CM | POA: Diagnosis not present

## 2022-08-21 DIAGNOSIS — L299 Pruritus, unspecified: Secondary | ICD-10-CM | POA: Diagnosis not present

## 2022-08-21 DIAGNOSIS — D631 Anemia in chronic kidney disease: Secondary | ICD-10-CM | POA: Diagnosis not present

## 2022-08-23 DIAGNOSIS — L299 Pruritus, unspecified: Secondary | ICD-10-CM | POA: Diagnosis not present

## 2022-08-23 DIAGNOSIS — N2581 Secondary hyperparathyroidism of renal origin: Secondary | ICD-10-CM | POA: Diagnosis not present

## 2022-08-23 DIAGNOSIS — Z992 Dependence on renal dialysis: Secondary | ICD-10-CM | POA: Diagnosis not present

## 2022-08-23 DIAGNOSIS — N186 End stage renal disease: Secondary | ICD-10-CM | POA: Diagnosis not present

## 2022-08-26 ENCOUNTER — Encounter: Payer: Self-pay | Admitting: Interventional Radiology

## 2022-08-26 ENCOUNTER — Ambulatory Visit (HOSPITAL_COMMUNITY)
Admission: RE | Admit: 2022-08-26 | Discharge: 2022-08-26 | Disposition: A | Discharge status: Home / Self Care | Payer: Medicare PPO | Source: Ambulatory Visit | Attending: Interventional Radiology | Admitting: Interventional Radiology

## 2022-08-26 DIAGNOSIS — Z436 Encounter for attention to other artificial openings of urinary tract: Secondary | ICD-10-CM | POA: Insufficient documentation

## 2022-08-26 DIAGNOSIS — N2889 Other specified disorders of kidney and ureter: Secondary | ICD-10-CM | POA: Diagnosis not present

## 2022-08-26 DIAGNOSIS — L299 Pruritus, unspecified: Secondary | ICD-10-CM | POA: Diagnosis not present

## 2022-08-26 DIAGNOSIS — N2581 Secondary hyperparathyroidism of renal origin: Secondary | ICD-10-CM | POA: Diagnosis not present

## 2022-08-26 DIAGNOSIS — R319 Hematuria, unspecified: Secondary | ICD-10-CM | POA: Insufficient documentation

## 2022-08-26 DIAGNOSIS — N186 End stage renal disease: Secondary | ICD-10-CM | POA: Diagnosis not present

## 2022-08-26 DIAGNOSIS — Z992 Dependence on renal dialysis: Secondary | ICD-10-CM | POA: Diagnosis not present

## 2022-08-26 HISTORY — PX: IR NEPHROSTOMY EXCHANGE LEFT: IMG6069

## 2022-08-26 MED ORDER — LIDOCAINE-EPINEPHRINE 1 %-1:100000 IJ SOLN
20.0000 mL | Freq: Once | INTRAMUSCULAR | Status: AC
  Administered 2022-08-26: 5 mL via INTRADERMAL

## 2022-08-26 MED ORDER — IOHEXOL 300 MG/ML  SOLN
50.0000 mL | Freq: Once | INTRAMUSCULAR | Status: AC | PRN
  Administered 2022-08-26: 15 mL

## 2022-08-26 MED ORDER — LIDOCAINE-EPINEPHRINE 1 %-1:100000 IJ SOLN
INTRAMUSCULAR | Status: AC
  Filled 2022-08-26: qty 1

## 2022-08-26 NOTE — Procedures (Signed)
Pre Procedure Dx: Urine leak Post Procedural Dx: Same  Successful [place] and fluoroscopic guided placement of a left sided PCN with end coiled and locked within the renal pelvis. PCN connected to gravity bag.  EBL: Trace Complications: None immediate.  Katherina Right, MD Pager #: [phone]

## 2022-08-27 DIAGNOSIS — M1990 Unspecified osteoarthritis, unspecified site: Secondary | ICD-10-CM | POA: Diagnosis not present

## 2022-08-27 DIAGNOSIS — I4891 Unspecified atrial fibrillation: Secondary | ICD-10-CM | POA: Diagnosis not present

## 2022-08-27 DIAGNOSIS — I509 Heart failure, unspecified: Secondary | ICD-10-CM | POA: Diagnosis not present

## 2022-08-27 DIAGNOSIS — E213 Hyperparathyroidism, unspecified: Secondary | ICD-10-CM | POA: Diagnosis not present

## 2022-08-27 DIAGNOSIS — D631 Anemia in chronic kidney disease: Secondary | ICD-10-CM | POA: Diagnosis not present

## 2022-08-27 DIAGNOSIS — Z992 Dependence on renal dialysis: Secondary | ICD-10-CM | POA: Diagnosis not present

## 2022-08-27 DIAGNOSIS — E1022 Type 1 diabetes mellitus with diabetic chronic kidney disease: Secondary | ICD-10-CM | POA: Diagnosis not present

## 2022-08-27 DIAGNOSIS — F419 Anxiety disorder, unspecified: Secondary | ICD-10-CM | POA: Diagnosis not present

## 2022-08-27 DIAGNOSIS — E559 Vitamin D deficiency, unspecified: Secondary | ICD-10-CM | POA: Diagnosis not present

## 2022-08-27 DIAGNOSIS — I132 Hypertensive heart and chronic kidney disease with heart failure and with stage 5 chronic kidney disease, or end stage renal disease: Secondary | ICD-10-CM | POA: Diagnosis not present

## 2022-08-27 DIAGNOSIS — E7800 Pure hypercholesterolemia, unspecified: Secondary | ICD-10-CM | POA: Diagnosis not present

## 2022-08-27 DIAGNOSIS — N186 End stage renal disease: Secondary | ICD-10-CM | POA: Diagnosis not present

## 2022-08-28 DIAGNOSIS — B9689 Other specified bacterial agents as the cause of diseases classified elsewhere: Secondary | ICD-10-CM | POA: Diagnosis not present

## 2022-08-28 DIAGNOSIS — J329 Chronic sinusitis, unspecified: Secondary | ICD-10-CM | POA: Diagnosis not present

## 2022-08-28 DIAGNOSIS — Z992 Dependence on renal dialysis: Secondary | ICD-10-CM | POA: Diagnosis not present

## 2022-08-28 DIAGNOSIS — N2581 Secondary hyperparathyroidism of renal origin: Secondary | ICD-10-CM | POA: Diagnosis not present

## 2022-08-28 DIAGNOSIS — I509 Heart failure, unspecified: Secondary | ICD-10-CM | POA: Diagnosis not present

## 2022-08-28 DIAGNOSIS — J069 Acute upper respiratory infection, unspecified: Secondary | ICD-10-CM | POA: Diagnosis not present

## 2022-08-28 DIAGNOSIS — D649 Anemia, unspecified: Secondary | ICD-10-CM | POA: Diagnosis not present

## 2022-08-28 DIAGNOSIS — N186 End stage renal disease: Secondary | ICD-10-CM | POA: Diagnosis not present

## 2022-08-28 DIAGNOSIS — R7303 Prediabetes: Secondary | ICD-10-CM | POA: Diagnosis not present

## 2022-08-28 DIAGNOSIS — I10 Essential (primary) hypertension: Secondary | ICD-10-CM | POA: Diagnosis not present

## 2022-08-28 DIAGNOSIS — L299 Pruritus, unspecified: Secondary | ICD-10-CM | POA: Diagnosis not present

## 2022-08-28 DIAGNOSIS — Z1159 Encounter for screening for other viral diseases: Secondary | ICD-10-CM | POA: Diagnosis not present

## 2022-08-28 DIAGNOSIS — D509 Iron deficiency anemia, unspecified: Secondary | ICD-10-CM | POA: Diagnosis not present

## 2022-08-28 DIAGNOSIS — N3000 Acute cystitis without hematuria: Secondary | ICD-10-CM | POA: Diagnosis not present

## 2022-08-28 DIAGNOSIS — N390 Urinary tract infection, site not specified: Secondary | ICD-10-CM | POA: Diagnosis not present

## 2022-08-29 DIAGNOSIS — J329 Chronic sinusitis, unspecified: Secondary | ICD-10-CM | POA: Diagnosis not present

## 2022-08-29 DIAGNOSIS — N3000 Acute cystitis without hematuria: Secondary | ICD-10-CM | POA: Diagnosis not present

## 2022-08-30 DIAGNOSIS — N2581 Secondary hyperparathyroidism of renal origin: Secondary | ICD-10-CM | POA: Diagnosis not present

## 2022-08-30 DIAGNOSIS — L299 Pruritus, unspecified: Secondary | ICD-10-CM | POA: Diagnosis not present

## 2022-08-30 DIAGNOSIS — Z992 Dependence on renal dialysis: Secondary | ICD-10-CM | POA: Diagnosis not present

## 2022-08-30 DIAGNOSIS — N186 End stage renal disease: Secondary | ICD-10-CM | POA: Diagnosis not present

## 2022-09-02 ENCOUNTER — Other Ambulatory Visit (HOSPITAL_COMMUNITY): Payer: Self-pay | Admitting: Interventional Radiology

## 2022-09-02 DIAGNOSIS — L299 Pruritus, unspecified: Secondary | ICD-10-CM | POA: Diagnosis not present

## 2022-09-02 DIAGNOSIS — Z992 Dependence on renal dialysis: Secondary | ICD-10-CM | POA: Diagnosis not present

## 2022-09-02 DIAGNOSIS — C649 Malignant neoplasm of unspecified kidney, except renal pelvis: Secondary | ICD-10-CM

## 2022-09-02 DIAGNOSIS — N186 End stage renal disease: Secondary | ICD-10-CM | POA: Diagnosis not present

## 2022-09-02 DIAGNOSIS — N2581 Secondary hyperparathyroidism of renal origin: Secondary | ICD-10-CM | POA: Diagnosis not present

## 2022-09-03 DIAGNOSIS — E876 Hypokalemia: Secondary | ICD-10-CM | POA: Diagnosis not present

## 2022-09-03 DIAGNOSIS — R600 Localized edema: Secondary | ICD-10-CM | POA: Diagnosis not present

## 2022-09-03 DIAGNOSIS — R7989 Other specified abnormal findings of blood chemistry: Secondary | ICD-10-CM | POA: Diagnosis not present

## 2022-09-03 DIAGNOSIS — D631 Anemia in chronic kidney disease: Secondary | ICD-10-CM | POA: Diagnosis not present

## 2022-09-03 DIAGNOSIS — E1165 Type 2 diabetes mellitus with hyperglycemia: Secondary | ICD-10-CM | POA: Diagnosis not present

## 2022-09-03 DIAGNOSIS — N189 Chronic kidney disease, unspecified: Secondary | ICD-10-CM | POA: Diagnosis not present

## 2022-09-03 DIAGNOSIS — I10 Essential (primary) hypertension: Secondary | ICD-10-CM | POA: Diagnosis not present

## 2022-09-03 DIAGNOSIS — E782 Mixed hyperlipidemia: Secondary | ICD-10-CM | POA: Diagnosis not present

## 2022-09-03 DIAGNOSIS — Z789 Other specified health status: Secondary | ICD-10-CM | POA: Diagnosis not present

## 2022-09-04 DIAGNOSIS — L299 Pruritus, unspecified: Secondary | ICD-10-CM | POA: Diagnosis not present

## 2022-09-04 DIAGNOSIS — N186 End stage renal disease: Secondary | ICD-10-CM | POA: Diagnosis not present

## 2022-09-04 DIAGNOSIS — Z992 Dependence on renal dialysis: Secondary | ICD-10-CM | POA: Diagnosis not present

## 2022-09-04 DIAGNOSIS — N2581 Secondary hyperparathyroidism of renal origin: Secondary | ICD-10-CM | POA: Diagnosis not present

## 2022-09-06 DIAGNOSIS — N2581 Secondary hyperparathyroidism of renal origin: Secondary | ICD-10-CM | POA: Diagnosis not present

## 2022-09-06 DIAGNOSIS — Z992 Dependence on renal dialysis: Secondary | ICD-10-CM | POA: Diagnosis not present

## 2022-09-06 DIAGNOSIS — N186 End stage renal disease: Secondary | ICD-10-CM | POA: Diagnosis not present

## 2022-09-06 DIAGNOSIS — L299 Pruritus, unspecified: Secondary | ICD-10-CM | POA: Diagnosis not present

## 2022-09-09 DIAGNOSIS — L299 Pruritus, unspecified: Secondary | ICD-10-CM | POA: Diagnosis not present

## 2022-09-09 DIAGNOSIS — Z992 Dependence on renal dialysis: Secondary | ICD-10-CM | POA: Diagnosis not present

## 2022-09-09 DIAGNOSIS — N2581 Secondary hyperparathyroidism of renal origin: Secondary | ICD-10-CM | POA: Diagnosis not present

## 2022-09-09 DIAGNOSIS — N186 End stage renal disease: Secondary | ICD-10-CM | POA: Diagnosis not present

## 2022-09-10 ENCOUNTER — Ambulatory Visit
Admission: RE | Admit: 2022-09-10 | Discharge: 2022-09-10 | Disposition: A | Discharge status: Home / Self Care | Payer: Medicare PPO | Source: Ambulatory Visit | Attending: Family Medicine | Admitting: Family Medicine

## 2022-09-10 ENCOUNTER — Other Ambulatory Visit: Payer: Self-pay | Admitting: Family Medicine

## 2022-09-10 DIAGNOSIS — M13861 Other specified arthritis, right knee: Secondary | ICD-10-CM

## 2022-09-10 DIAGNOSIS — M1712 Unilateral primary osteoarthritis, left knee: Secondary | ICD-10-CM

## 2022-09-10 DIAGNOSIS — R7989 Other specified abnormal findings of blood chemistry: Secondary | ICD-10-CM | POA: Diagnosis not present

## 2022-09-10 DIAGNOSIS — M1711 Unilateral primary osteoarthritis, right knee: Secondary | ICD-10-CM | POA: Diagnosis not present

## 2022-09-10 DIAGNOSIS — M25462 Effusion, left knee: Secondary | ICD-10-CM | POA: Diagnosis not present

## 2022-09-10 DIAGNOSIS — M170 Bilateral primary osteoarthritis of knee: Secondary | ICD-10-CM | POA: Diagnosis not present

## 2022-09-10 DIAGNOSIS — M25562 Pain in left knee: Secondary | ICD-10-CM | POA: Diagnosis not present

## 2022-09-10 DIAGNOSIS — M25561 Pain in right knee: Secondary | ICD-10-CM | POA: Diagnosis not present

## 2022-09-10 DIAGNOSIS — I509 Heart failure, unspecified: Secondary | ICD-10-CM | POA: Diagnosis not present

## 2022-09-10 DIAGNOSIS — N186 End stage renal disease: Secondary | ICD-10-CM | POA: Diagnosis not present

## 2022-09-11 DIAGNOSIS — I4891 Unspecified atrial fibrillation: Secondary | ICD-10-CM | POA: Diagnosis not present

## 2022-09-11 DIAGNOSIS — Z992 Dependence on renal dialysis: Secondary | ICD-10-CM | POA: Diagnosis not present

## 2022-09-11 DIAGNOSIS — I132 Hypertensive heart and chronic kidney disease with heart failure and with stage 5 chronic kidney disease, or end stage renal disease: Secondary | ICD-10-CM | POA: Diagnosis not present

## 2022-09-11 DIAGNOSIS — L299 Pruritus, unspecified: Secondary | ICD-10-CM | POA: Diagnosis not present

## 2022-09-11 DIAGNOSIS — N186 End stage renal disease: Secondary | ICD-10-CM | POA: Diagnosis not present

## 2022-09-11 DIAGNOSIS — N2581 Secondary hyperparathyroidism of renal origin: Secondary | ICD-10-CM | POA: Diagnosis not present

## 2022-09-11 DIAGNOSIS — E213 Hyperparathyroidism, unspecified: Secondary | ICD-10-CM | POA: Diagnosis not present

## 2022-09-11 DIAGNOSIS — E559 Vitamin D deficiency, unspecified: Secondary | ICD-10-CM | POA: Diagnosis not present

## 2022-09-11 DIAGNOSIS — D631 Anemia in chronic kidney disease: Secondary | ICD-10-CM | POA: Diagnosis not present

## 2022-09-11 DIAGNOSIS — E1022 Type 1 diabetes mellitus with diabetic chronic kidney disease: Secondary | ICD-10-CM | POA: Diagnosis not present

## 2022-09-11 DIAGNOSIS — I509 Heart failure, unspecified: Secondary | ICD-10-CM | POA: Diagnosis not present

## 2022-09-12 DIAGNOSIS — H1789 Other corneal scars and opacities: Secondary | ICD-10-CM | POA: Diagnosis not present

## 2022-09-13 ENCOUNTER — Inpatient Hospital Stay (HOSPITAL_COMMUNITY)
Admission: EM | Admit: 2022-09-13 | Discharge: 2022-09-24 | DRG: 308 | Disposition: A | Discharge status: Home Health | Payer: Medicare PPO | Attending: Internal Medicine | Admitting: Internal Medicine

## 2022-09-13 ENCOUNTER — Encounter (HOSPITAL_COMMUNITY): Payer: Self-pay

## 2022-09-13 DIAGNOSIS — R000 Tachycardia, unspecified: Secondary | ICD-10-CM | POA: Diagnosis not present

## 2022-09-13 DIAGNOSIS — G9341 Metabolic encephalopathy: Secondary | ICD-10-CM | POA: Diagnosis present

## 2022-09-13 DIAGNOSIS — E871 Hypo-osmolality and hyponatremia: Secondary | ICD-10-CM | POA: Diagnosis present

## 2022-09-13 DIAGNOSIS — K219 Gastro-esophageal reflux disease without esophagitis: Secondary | ICD-10-CM | POA: Diagnosis present

## 2022-09-13 DIAGNOSIS — R002 Palpitations: Secondary | ICD-10-CM | POA: Diagnosis present

## 2022-09-13 DIAGNOSIS — Z8673 Personal history of transient ischemic attack (TIA), and cerebral infarction without residual deficits: Secondary | ICD-10-CM | POA: Diagnosis not present

## 2022-09-13 DIAGNOSIS — G253 Myoclonus: Secondary | ICD-10-CM | POA: Diagnosis present

## 2022-09-13 DIAGNOSIS — R58 Hemorrhage, not elsewhere classified: Secondary | ICD-10-CM | POA: Diagnosis not present

## 2022-09-13 DIAGNOSIS — R29818 Other symptoms and signs involving the nervous system: Secondary | ICD-10-CM | POA: Diagnosis not present

## 2022-09-13 DIAGNOSIS — Z862 Personal history of diseases of the blood and blood-forming organs and certain disorders involving the immune mechanism: Secondary | ICD-10-CM

## 2022-09-13 DIAGNOSIS — B0230 Zoster ocular disease, unspecified: Secondary | ICD-10-CM | POA: Diagnosis present

## 2022-09-13 DIAGNOSIS — Z823 Family history of stroke: Secondary | ICD-10-CM

## 2022-09-13 DIAGNOSIS — R7401 Elevation of levels of liver transaminase levels: Secondary | ICD-10-CM | POA: Diagnosis present

## 2022-09-13 DIAGNOSIS — Z992 Dependence on renal dialysis: Secondary | ICD-10-CM

## 2022-09-13 DIAGNOSIS — T8089XA Other complications following infusion, transfusion and therapeutic injection, initial encounter: Secondary | ICD-10-CM | POA: Diagnosis not present

## 2022-09-13 DIAGNOSIS — L299 Pruritus, unspecified: Secondary | ICD-10-CM | POA: Diagnosis not present

## 2022-09-13 DIAGNOSIS — N2581 Secondary hyperparathyroidism of renal origin: Secondary | ICD-10-CM | POA: Diagnosis not present

## 2022-09-13 DIAGNOSIS — M109 Gout, unspecified: Secondary | ICD-10-CM | POA: Diagnosis present

## 2022-09-13 DIAGNOSIS — I480 Paroxysmal atrial fibrillation: Secondary | ICD-10-CM | POA: Diagnosis present

## 2022-09-13 DIAGNOSIS — R4182 Altered mental status, unspecified: Secondary | ICD-10-CM | POA: Diagnosis not present

## 2022-09-13 DIAGNOSIS — Z87442 Personal history of urinary calculi: Secondary | ICD-10-CM

## 2022-09-13 DIAGNOSIS — N186 End stage renal disease: Secondary | ICD-10-CM | POA: Diagnosis present

## 2022-09-13 DIAGNOSIS — Z881 Allergy status to other antibiotic agents status: Secondary | ICD-10-CM

## 2022-09-13 DIAGNOSIS — I10 Essential (primary) hypertension: Secondary | ICD-10-CM | POA: Diagnosis present

## 2022-09-13 DIAGNOSIS — I6782 Cerebral ischemia: Secondary | ICD-10-CM | POA: Diagnosis not present

## 2022-09-13 DIAGNOSIS — N189 Chronic kidney disease, unspecified: Secondary | ICD-10-CM | POA: Diagnosis not present

## 2022-09-13 DIAGNOSIS — I959 Hypotension, unspecified: Secondary | ICD-10-CM | POA: Diagnosis present

## 2022-09-13 DIAGNOSIS — I132 Hypertensive heart and chronic kidney disease with heart failure and with stage 5 chronic kidney disease, or end stage renal disease: Secondary | ICD-10-CM | POA: Diagnosis present

## 2022-09-13 DIAGNOSIS — B0239 Other herpes zoster eye disease: Secondary | ICD-10-CM | POA: Diagnosis not present

## 2022-09-13 DIAGNOSIS — R9431 Abnormal electrocardiogram [ECG] [EKG]: Secondary | ICD-10-CM | POA: Diagnosis present

## 2022-09-13 DIAGNOSIS — B0052 Herpesviral keratitis: Secondary | ICD-10-CM | POA: Diagnosis present

## 2022-09-13 DIAGNOSIS — T375X5A Adverse effect of antiviral drugs, initial encounter: Secondary | ICD-10-CM | POA: Diagnosis present

## 2022-09-13 DIAGNOSIS — Z9049 Acquired absence of other specified parts of digestive tract: Secondary | ICD-10-CM

## 2022-09-13 DIAGNOSIS — I158 Other secondary hypertension: Secondary | ICD-10-CM | POA: Diagnosis not present

## 2022-09-13 DIAGNOSIS — R079 Chest pain, unspecified: Secondary | ICD-10-CM | POA: Diagnosis not present

## 2022-09-13 DIAGNOSIS — E119 Type 2 diabetes mellitus without complications: Secondary | ICD-10-CM

## 2022-09-13 DIAGNOSIS — M898X9 Other specified disorders of bone, unspecified site: Secondary | ICD-10-CM | POA: Diagnosis present

## 2022-09-13 DIAGNOSIS — Z885 Allergy status to narcotic agent status: Secondary | ICD-10-CM

## 2022-09-13 DIAGNOSIS — I517 Cardiomegaly: Secondary | ICD-10-CM | POA: Diagnosis not present

## 2022-09-13 DIAGNOSIS — E559 Vitamin D deficiency, unspecified: Secondary | ICD-10-CM | POA: Diagnosis present

## 2022-09-13 DIAGNOSIS — R918 Other nonspecific abnormal finding of lung field: Secondary | ICD-10-CM | POA: Diagnosis not present

## 2022-09-13 DIAGNOSIS — I4891 Unspecified atrial fibrillation: Secondary | ICD-10-CM | POA: Diagnosis not present

## 2022-09-13 DIAGNOSIS — J9811 Atelectasis: Secondary | ICD-10-CM | POA: Diagnosis present

## 2022-09-13 DIAGNOSIS — D631 Anemia in chronic kidney disease: Secondary | ICD-10-CM | POA: Diagnosis present

## 2022-09-13 DIAGNOSIS — Z888 Allergy status to other drugs, medicaments and biological substances status: Secondary | ICD-10-CM

## 2022-09-13 DIAGNOSIS — I5032 Chronic diastolic (congestive) heart failure: Secondary | ICD-10-CM | POA: Diagnosis present

## 2022-09-13 DIAGNOSIS — Z820 Family history of epilepsy and other diseases of the nervous system: Secondary | ICD-10-CM

## 2022-09-13 DIAGNOSIS — N250 Renal osteodystrophy: Secondary | ICD-10-CM | POA: Diagnosis not present

## 2022-09-13 DIAGNOSIS — G2581 Restless legs syndrome: Secondary | ICD-10-CM | POA: Diagnosis present

## 2022-09-13 DIAGNOSIS — S5011XA Contusion of right forearm, initial encounter: Secondary | ICD-10-CM | POA: Diagnosis not present

## 2022-09-13 DIAGNOSIS — Z794 Long term (current) use of insulin: Secondary | ICD-10-CM

## 2022-09-13 DIAGNOSIS — J42 Unspecified chronic bronchitis: Secondary | ICD-10-CM | POA: Diagnosis not present

## 2022-09-13 DIAGNOSIS — Z8249 Family history of ischemic heart disease and other diseases of the circulatory system: Secondary | ICD-10-CM

## 2022-09-13 DIAGNOSIS — R531 Weakness: Secondary | ICD-10-CM | POA: Diagnosis not present

## 2022-09-13 DIAGNOSIS — Z79899 Other long term (current) drug therapy: Secondary | ICD-10-CM

## 2022-09-13 DIAGNOSIS — E1122 Type 2 diabetes mellitus with diabetic chronic kidney disease: Secondary | ICD-10-CM | POA: Diagnosis present

## 2022-09-13 DIAGNOSIS — Z90411 Acquired partial absence of pancreas: Secondary | ICD-10-CM

## 2022-09-13 DIAGNOSIS — I120 Hypertensive chronic kidney disease with stage 5 chronic kidney disease or end stage renal disease: Secondary | ICD-10-CM | POA: Diagnosis not present

## 2022-09-13 DIAGNOSIS — Z808 Family history of malignant neoplasm of other organs or systems: Secondary | ICD-10-CM

## 2022-09-13 DIAGNOSIS — Z85528 Personal history of other malignant neoplasm of kidney: Secondary | ICD-10-CM

## 2022-09-13 MED ORDER — DILTIAZEM HCL 25 MG/5ML IV SOLN
10.0000 mg | Freq: Once | INTRAVENOUS | Status: AC
  Administered 2022-09-13: 10 mg via INTRAVENOUS
  Filled 2022-09-13: qty 5

## 2022-09-13 NOTE — ED Triage Notes (Signed)
PT BIB [facility] with c/o weakness and fatigue. Hx afib. EMS reports pt in Afib RVR with HR 150-170s . Pt is on dialysis MWF and last txt today. L arm restriction. No meds given en route.

## 2022-09-14 ENCOUNTER — Encounter (HOSPITAL_COMMUNITY): Payer: Self-pay | Admitting: Internal Medicine

## 2022-09-14 ENCOUNTER — Emergency Department (HOSPITAL_COMMUNITY): Payer: Medicare PPO

## 2022-09-14 ENCOUNTER — Observation Stay (HOSPITAL_COMMUNITY): Payer: Medicare PPO

## 2022-09-14 DIAGNOSIS — R29818 Other symptoms and signs involving the nervous system: Secondary | ICD-10-CM | POA: Diagnosis not present

## 2022-09-14 DIAGNOSIS — I5032 Chronic diastolic (congestive) heart failure: Secondary | ICD-10-CM

## 2022-09-14 DIAGNOSIS — E1122 Type 2 diabetes mellitus with diabetic chronic kidney disease: Secondary | ICD-10-CM

## 2022-09-14 DIAGNOSIS — Z794 Long term (current) use of insulin: Secondary | ICD-10-CM

## 2022-09-14 DIAGNOSIS — I6782 Cerebral ischemia: Secondary | ICD-10-CM | POA: Diagnosis not present

## 2022-09-14 DIAGNOSIS — Z8673 Personal history of transient ischemic attack (TIA), and cerebral infarction without residual deficits: Secondary | ICD-10-CM | POA: Diagnosis not present

## 2022-09-14 DIAGNOSIS — Z992 Dependence on renal dialysis: Secondary | ICD-10-CM

## 2022-09-14 DIAGNOSIS — I4891 Unspecified atrial fibrillation: Secondary | ICD-10-CM | POA: Diagnosis not present

## 2022-09-14 DIAGNOSIS — N186 End stage renal disease: Secondary | ICD-10-CM

## 2022-09-14 DIAGNOSIS — N189 Chronic kidney disease, unspecified: Secondary | ICD-10-CM

## 2022-09-14 DIAGNOSIS — I10 Essential (primary) hypertension: Secondary | ICD-10-CM | POA: Diagnosis present

## 2022-09-14 DIAGNOSIS — Z862 Personal history of diseases of the blood and blood-forming organs and certain disorders involving the immune mechanism: Secondary | ICD-10-CM

## 2022-09-14 LAB — BRAIN NATRIURETIC PEPTIDE: B Natriuretic Peptide: 1469 pg/mL — ABNORMAL HIGH (ref 0.0–100.0)

## 2022-09-14 LAB — GLUCOSE, CAPILLARY
Glucose-Capillary: 140 mg/dL — ABNORMAL HIGH (ref 70–99)
Glucose-Capillary: 134 mg/dL — ABNORMAL HIGH (ref 70–99)

## 2022-09-14 LAB — CBC WITH DIFFERENTIAL/PLATELET
Abs Immature Granulocytes: 0.05 10*3/uL (ref 0.00–0.07)
Basophils Absolute: 0 10*3/uL (ref 0.0–0.1)
Basophils Relative: 0 %
Eosinophils Absolute: 0.1 10*3/uL (ref 0.0–0.5)
Eosinophils Relative: 1 %
HCT: 34.1 % — ABNORMAL LOW (ref 36.0–46.0)
Hemoglobin: 10.5 g/dL — ABNORMAL LOW (ref 12.0–15.0)
Immature Granulocytes: 1 %
Lymphocytes Relative: 7 %
Lymphs Abs: 0.7 10*3/uL (ref 0.7–4.0)
MCH: 28.5 pg (ref 26.0–34.0)
MCHC: 30.8 g/dL (ref 30.0–36.0)
MCV: 92.7 fL (ref 80.0–100.0)
Monocytes Absolute: 0.6 10*3/uL (ref 0.1–1.0)
Monocytes Relative: 6 %
Neutro Abs: 8.3 10*3/uL — ABNORMAL HIGH (ref 1.7–7.7)
Neutrophils Relative %: 85 %
Platelets: 233 10*3/uL (ref 150–400)
RBC: 3.68 MIL/uL — ABNORMAL LOW (ref 3.87–5.11)
RDW: 18 % — ABNORMAL HIGH (ref 11.5–15.5)
WBC: 9.7 10*3/uL (ref 4.0–10.5)
nRBC: 0 % (ref 0.0–0.2)

## 2022-09-14 LAB — COMPREHENSIVE METABOLIC PANEL
ALT: 90 U/L — ABNORMAL HIGH (ref 0–44)
AST: 51 U/L — ABNORMAL HIGH (ref 15–41)
Albumin: 2.7 g/dL — ABNORMAL LOW (ref 3.5–5.0)
Alkaline Phosphatase: 72 U/L (ref 38–126)
Anion gap: 16 — ABNORMAL HIGH (ref 5–15)
BUN: 38 mg/dL — ABNORMAL HIGH (ref 8–23)
CO2: 24 mmol/L (ref 22–32)
Calcium: 7.6 mg/dL — ABNORMAL LOW (ref 8.9–10.3)
Chloride: 95 mmol/L — ABNORMAL LOW (ref 98–111)
Creatinine, Ser: 3.4 mg/dL — ABNORMAL HIGH (ref 0.44–1.00)
GFR, Estimated: 13 mL/min — ABNORMAL LOW (ref >60)
Glucose, Bld: 175 mg/dL — ABNORMAL HIGH (ref 70–99)
Potassium: 3.7 mmol/L (ref 3.5–5.1)
Sodium: 135 mmol/L (ref 135–145)
Total Bilirubin: 0.4 mg/dL (ref 0.3–1.2)
Total Protein: 5.8 g/dL — ABNORMAL LOW (ref 6.5–8.1)

## 2022-09-14 LAB — CBC
HCT: 34.9 % — ABNORMAL LOW (ref 36.0–46.0)
Hemoglobin: 10.8 g/dL — ABNORMAL LOW (ref 12.0–15.0)
MCH: 28.7 pg (ref 26.0–34.0)
MCHC: 30.9 g/dL (ref 30.0–36.0)
MCV: 92.8 fL (ref 80.0–100.0)
Platelets: 226 10*3/uL (ref 150–400)
RBC: 3.76 MIL/uL — ABNORMAL LOW (ref 3.87–5.11)
RDW: 18.1 % — ABNORMAL HIGH (ref 11.5–15.5)
WBC: 10 10*3/uL (ref 4.0–10.5)
nRBC: 0 % (ref 0.0–0.2)

## 2022-09-14 LAB — CBG MONITORING, ED
Glucose-Capillary: 174 mg/dL — ABNORMAL HIGH (ref 70–99)
Glucose-Capillary: 133 mg/dL — ABNORMAL HIGH (ref 70–99)

## 2022-09-14 LAB — MAGNESIUM: Magnesium: 2.2 mg/dL (ref 1.7–2.4)

## 2022-09-14 LAB — TSH: TSH: 2.84 u[IU]/mL (ref 0.350–4.500)

## 2022-09-14 LAB — PROCALCITONIN: Procalcitonin: 0.22 ng/mL

## 2022-09-14 MED ORDER — CINACALCET HCL 30 MG PO TABS
30.0000 mg | ORAL_TABLET | Freq: Three times a day (TID) | ORAL | Status: DC
  Administered 2022-09-14 – 2022-09-22 (×11): 30 mg via ORAL
  Filled 2022-09-14 (×25): qty 1

## 2022-09-14 MED ORDER — MELATONIN 3 MG PO TABS
3.0000 mg | ORAL_TABLET | Freq: Every evening | ORAL | Status: DC | PRN
  Administered 2022-09-14 – 2022-09-23 (×8): 3 mg via ORAL
  Filled 2022-09-14 (×8): qty 1

## 2022-09-14 MED ORDER — PANCRELIPASE (LIP-PROT-AMYL) 36000-114000 UNITS PO CPEP
36000.0000 [IU] | ORAL_CAPSULE | Freq: Three times a day (TID) | ORAL | Status: DC
  Administered 2022-09-17 – 2022-09-24 (×22): 36000 [IU] via ORAL
  Filled 2022-09-14 (×33): qty 1

## 2022-09-14 MED ORDER — INSULIN ASPART 100 UNIT/ML IJ SOLN
0.0000 [IU] | Freq: Three times a day (TID) | INTRAMUSCULAR | Status: DC
  Administered 2022-09-14 – 2022-09-17 (×3): 1 [IU] via SUBCUTANEOUS
  Administered 2022-09-18: 2 [IU] via SUBCUTANEOUS
  Administered 2022-09-18 – 2022-09-24 (×10): 1 [IU] via SUBCUTANEOUS

## 2022-09-14 MED ORDER — NEOMYCIN-POLYMYXIN-DEXAMETH 3.5-10000-0.1 OP SUSP
1.0000 [drp] | Freq: Three times a day (TID) | OPHTHALMIC | Status: DC
  Administered 2022-09-14 – 2022-09-17 (×4): 1 [drp] via OPHTHALMIC
  Filled 2022-09-14: qty 5

## 2022-09-14 MED ORDER — ACETAMINOPHEN 650 MG RE SUPP: 650.0000 mg | Freq: Four times a day (QID) | RECTAL | Status: DC | PRN

## 2022-09-14 MED ORDER — PREDNISOLONE ACETATE 1 % OP SUSP
1.0000 [drp] | Freq: Four times a day (QID) | OPHTHALMIC | Status: DC
  Administered 2022-09-14 – 2022-09-17 (×7): 1 [drp] via OPHTHALMIC
  Filled 2022-09-14: qty 5

## 2022-09-14 MED ORDER — BRINZOLAMIDE 1 % OP SUSP
1.0000 [drp] | Freq: Two times a day (BID) | OPHTHALMIC | Status: DC
  Administered 2022-09-14 – 2022-09-24 (×19): 1 [drp] via OPHTHALMIC
  Filled 2022-09-14: qty 10

## 2022-09-14 MED ORDER — SEVELAMER CARBONATE 800 MG PO TABS
1600.0000 mg | ORAL_TABLET | Freq: Three times a day (TID) | ORAL | Status: DC
  Administered 2022-09-14 – 2022-09-24 (×20): 1600 mg via ORAL
  Filled 2022-09-14 (×22): qty 2

## 2022-09-14 MED ORDER — ACETAMINOPHEN 325 MG PO TABS
650.0000 mg | ORAL_TABLET | Freq: Four times a day (QID) | ORAL | Status: DC | PRN
  Administered 2022-09-14 – 2022-09-23 (×9): 650 mg via ORAL
  Filled 2022-09-14 (×9): qty 2

## 2022-09-14 MED ORDER — VALACYCLOVIR HCL 500 MG PO TABS
1000.0000 mg | ORAL_TABLET | Freq: Three times a day (TID) | ORAL | Status: DC
  Administered 2022-09-14: 1000 mg via ORAL
  Filled 2022-09-14 (×2): qty 2

## 2022-09-14 MED ORDER — PANCRELIPASE (LIP-PROT-AMYL) 36000-114000 UNITS PO CPEP
36000.0000 [IU] | ORAL_CAPSULE | Freq: Two times a day (BID) | ORAL | Status: DC
  Filled 2022-09-14: qty 1

## 2022-09-14 MED ORDER — AMIODARONE HCL 100 MG PO TABS
100.0000 mg | ORAL_TABLET | Freq: Every day | ORAL | Status: DC
  Administered 2022-09-14 – 2022-09-24 (×9): 100 mg via ORAL
  Filled 2022-09-14 (×9): qty 1

## 2022-09-14 MED ORDER — ONDANSETRON HCL 4 MG/2ML IJ SOLN: 4.0000 mg | Freq: Four times a day (QID) | INTRAMUSCULAR | Status: DC | PRN

## 2022-09-14 MED ORDER — ROPINIROLE HCL 0.25 MG PO TABS
0.2500 mg | ORAL_TABLET | Freq: Every day | ORAL | Status: DC
  Administered 2022-09-14 – 2022-09-18 (×3): 0.25 mg via ORAL
  Filled 2022-09-14 (×5): qty 1

## 2022-09-14 MED ORDER — BRIMONIDINE TARTRATE 0.2 % OP SOLN
1.0000 [drp] | Freq: Two times a day (BID) | OPHTHALMIC | Status: DC
  Administered 2022-09-14 – 2022-09-24 (×18): 1 [drp] via OPHTHALMIC
  Filled 2022-09-14: qty 5

## 2022-09-14 MED ORDER — DILTIAZEM HCL-DEXTROSE 125-5 MG/125ML-% IV SOLN (PREMIX)
5.0000 mg/h | INTRAVENOUS | Status: DC
  Administered 2022-09-14: 5 mg/h via INTRAVENOUS
  Filled 2022-09-14 (×2): qty 125

## 2022-09-14 MED ORDER — METOPROLOL TARTRATE 12.5 MG HALF TABLET
12.5000 mg | ORAL_TABLET | Freq: Two times a day (BID) | ORAL | Status: DC
  Administered 2022-09-14 (×2): 12.5 mg via ORAL
  Filled 2022-09-14 (×2): qty 1

## 2022-09-14 NOTE — ED Provider Notes (Signed)
[place] EMERGENCY DEPARTMENT AT [facility] Provider Note   CSN: [id number] Arrival date & time: 09/13/22  2307     History  Chief Complaint  Patient presents with   Weakness   Fatigue   Tachycardia    Kim Burns is a 87 y.o. female.  HPI     This is an 87 year old female with end-stage renal disease, paroxysmal atrial fibrillation who presents with generalized weakness and palpitations.  Patient had a full dialysis session earlier today.  Since that time she has felt generally weak.  She states that she did not feel like doing much today.  Has not had any fevers or shortness of breath.  She states at baseline she has some shortness of breath which is not new.  She has had some lower extremity swelling which is actually improved from prior.  Patient states that tonight she began to have palpitations.  Per EMS she was in atrial fibrillation.  She based on chart review is on carvedilol and amiodarone.  She is no longer on Eliquis secondary to renal hematoma.  Home Medications Prior to Admission medications   Medication Sig Start Date End Date Taking? Authorizing Provider  acetaminophen (TYLENOL) 325 MG tablet Take 2 tablets (650 mg total) by mouth every 6 (six) hours as needed for mild pain, fever or headache. Patient taking differently: Take 500 mg by mouth every 6 (six) hours as needed for mild pain, fever or headache. 01/30/22   Elgergawy, Leana Roe, MD  amiodarone (PACERONE) 200 MG tablet Take 0.5 tablets (100 mg total) by mouth daily. Please keep appointment for further refills 11/23/21   [place], Peter M, MD  B Complex-C-Zn-Folic Acid (DIALYVITE 800 WITH ZINC) 0.8 MG TABS Take 1 tablet by mouth daily.    [provider]  carvedilol (COREG) 3.125 MG tablet TAKE 1 TABLET BY MOUTH TWICE DAILY WITH A MEAL .  TAKE  ON  THE  DAYS  YOU  DO  NOT  HAVE  DIALYSIS. 08/12/22   [place], Peter M, MD  Cholecalciferol (VITAMIN D-3 PO) Take 1 capsule by mouth every Monday,  Wednesday, and Friday with hemodialysis.    [provider]  colestipol (COLESTID) 1 g tablet Take 1 g by mouth See admin instructions. 1 tablet (1g) prior to dialysis M-W-F + 1 tablet daily PRN diarrhea    [provider]  diclofenac sodium (VOLTAREN) 1 % GEL Apply 2 g topically 4 (four) times daily as needed (for hand pain).    [provider]  folic acid (FOLVITE) 1 MG tablet Take 1 tablet (1 mg total) by mouth daily. 01/31/22   Elgergawy, Leana Roe, MD  insulin aspart (NOVOLOG) 100 UNIT/ML injection Inject 0-6 Units into the skin 3 (three) times daily with meals. 01/30/22   Elgergawy, Leana Roe, MD  lidocaine-prilocaine (EMLA) cream Apply 1 Application topically every Monday, Wednesday, and Friday with hemodialysis.    [provider]  lipase/protease/amylase (CREON) 36000 UNITS CPEP capsule Take 36,000 Units by mouth 2 (two) times daily with a meal.    [provider]  midodrine (PROAMATINE) 10 MG tablet Take 10 mg on dialysis days Mon-Wed-Fri 06/13/22   [place], Peter M, MD  olopatadine (PATADAY) 0.1 % ophthalmic solution Place 1 drop into both eyes 2 (two) times daily.    [provider]  RESTASIS 0.05 % ophthalmic emulsion 1 drop 2 (two) times daily. 11/06/21   [provider]  rOPINIRole (REQUIP) 0.25 MG tablet Take 0.25 mg by  mouth at bedtime. 12/11/21   [provider]  SENSIPAR 30 MG tablet Take by mouth. 12/18/12   [provider]  sevelamer carbonate (RENVELA) 800 MG tablet Take 1,600 mg by mouth 3 (three) times daily with meals.    [provider]  XIIDRA 5 % SOLN Place 1 drop into both eyes at bedtime. 09/24/21   [provider]      Allergies    Lopid [gemfibrozil], Lotemax [loteprednol etabonate], Statins, Norco [hydrocodone-acetaminophen], Other, Tomato, Vibra-tab [doxycycline], Amlodipine, Codeine, Hydrocodone, Hydrocodone-acetaminophen, Lisinopril, and Verapamil    Review of Systems    Review of Systems  Constitutional:  Negative for fever.  Respiratory:  Negative for shortness of breath.   Cardiovascular:  Positive for palpitations and leg swelling. Negative for chest pain.  All other systems reviewed and are negative.   Physical Exam Updated Vital Signs BP (!) 144/89   Pulse (!) 129   Temp 98 F (36.7 C) (Oral)   Resp (!) 23   SpO2 99%  Physical Exam Vitals and nursing note reviewed.  Constitutional:      Appearance: She is well-developed.     Comments: Chronically ill-appearing but nontoxic  HENT:     Head: Normocephalic and atraumatic.  Eyes:     Pupils: Pupils are equal, round, and reactive to light.  Cardiovascular:     Rate and Rhythm: Tachycardia present. Rhythm irregular.     Heart sounds: Normal heart sounds.  Pulmonary:     Effort: Pulmonary effort is normal. No respiratory distress.     Breath sounds: No wheezing.  Abdominal:     Palpations: Abdomen is soft.     Tenderness: There is no abdominal tenderness.  Musculoskeletal:     Cervical back: Neck supple.     Right lower leg: Edema present.     Left lower leg: Edema present.  Skin:    General: Skin is warm and dry.  Neurological:     Mental Status: She is alert and oriented to person, place, and time.     ED Results / Procedures / Treatments   Labs (all labs ordered are listed, but only abnormal results are displayed) Labs Reviewed  BASIC METABOLIC PANEL - Abnormal; Notable for the following components:      Result Value   Chloride 95 (*)    Glucose, Bld 167 (*)    BUN 34 (*)    Creatinine, Ser 3.30 (*)    Calcium 7.9 (*)    GFR, Estimated 13 (*)    All other components within normal limits  CBC - Abnormal; Notable for the following components:   RBC 3.76 (*)    Hemoglobin 10.8 (*)    HCT 34.9 (*)    RDW 18.1 (*)    All other components within normal limits  MAGNESIUM    EKG EKG Interpretation Date/Time:  Friday September 13 2022 23:25:19 EDT Ventricular Rate:  164 PR  Interval:    QRS Duration:  104 QT Interval:  272 QTC Calculation: 450 R Axis:   79  Text Interpretation: Atrial fibrillation with rapid V-rate Probable anterior infarct, age indeterminate Confirmed by Ross Marcus ([id number]) on 09/14/2022 12:04:51 AM  Radiology DG Chest Portable 1 View  Result Date: 09/14/2022 CLINICAL DATA:  Palpitations EXAM: PORTABLE CHEST 1 VIEW COMPARISON:  Radiographs 08/09/2022 FINDINGS: Stable cardiomegaly. Aortic atherosclerotic calcification. Chronic bronchitic changes. Atelectasis or infiltrates in the right mid to upper lung. Right basilar atelectasis. No pleural effusion or pneumothorax. No displaced rib fractures. IMPRESSION:  Atelectasis or infiltrates in the right mid/upper lung. Electronically Signed   By: Minerva Fester M.D.   On: 09/14/2022 00:20    Procedures .Critical Care  Performed by: Shon Baton, MD Authorized by: Shon Baton, MD   Critical care provider statement:    Critical care time (minutes):  41   Critical care was necessary to treat or prevent imminent or life-threatening deterioration of the following conditions: Atrial fibrillation with RVR.   Critical care was time spent personally by me on the following activities:  Development of treatment plan with patient or surrogate, discussions with consultants, evaluation of patient's response to treatment, examination of patient, ordering and review of laboratory studies, ordering and review of radiographic studies, ordering and performing treatments and interventions, pulse oximetry, re-evaluation of patient's condition and review of old charts     Medications Ordered in ED Medications  diltiazem (CARDIZEM) 125 mg in dextrose 5% 125 mL (1 mg/mL) infusion (10 mg/hr Intravenous Rate/Dose Change 09/14/22 0238)  diltiazem (CARDIZEM) injection 10 mg (10 mg Intravenous Given 09/13/22 2352)    ED Course/ Medical Decision Making/ A&P                             Medical Decision  Making Amount and/or Complexity of Data Reviewed Labs: ordered. Radiology: ordered.  Risk Prescription drug management. Decision regarding hospitalization.   This patient presents to the ED for concern of generalized weakness, atrial fibrillation, this involves an extensive number of treatment options, and is a complaint that carries with it a high risk of complications and morbidity.  I considered the following differential and admission for this acute, potentially life threatening condition.  The differential diagnosis includes atrial fibrillation, dehydration, generalized illness or infection  MDM:    This is an 87 year old female who presents after not feeling well.  Had a full dialysis session and has not felt well since then.  She is chronically ill-appearing but nontoxic.  She is in atrial fibrillation with RVR.  Currently not on Eliquis.  She is not a candidate for cardioversion.  Given her renal function and history of hypotension requiring midodrine, she was given 1 dose of IV Cardizem.  This did not have much effect and blood pressure was stable.  She started on a diltiazem drip.  She maintains rates in the 120s to 130s.  Electrolytes are largely reassuring.  Mag is normal.  She is afebrile and denies any infectious complaints.  Chest x-ray shows atelectasis versus infiltrate.  Given absence of upper respiratory symptoms or fever, would favor atelectasis.  Patient is in persistent atrial fibrillation.  She is on a diltiazem drip with slow titration given her renal function and history.  Will require admission for stabilization and cardiology evaluation.  (Labs, imaging, consults)  Labs: I Ordered, and personally interpreted labs.  The pertinent results include: CBC, BMP, magnesium  Imaging Studies ordered: I ordered imaging studies including chest x-ray I independently visualized and interpreted imaging. I agree with the radiologist interpretation  Additional history obtained  from chart review.  External records from outside source obtained and reviewed including cardiology notes  Cardiac Monitoring: The patient was maintained on a cardiac monitor.  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 could not tolerate   Stroke (HCC) 04/16/2016   TIA 1995   Vitamin D deficiency      Medicines Meds ordered this encounter  Medications   diltiazem (CARDIZEM) injection 10 mg   diltiazem (CARDIZEM) 125 mg in dextrose 5% 125 mL (1 mg/mL) infusion    I have reviewed the patients home medicines and have made adjustments as needed  Problem List / ED Course: Problem List Items Addressed This Visit   None Visit Diagnoses     Atrial fibrillation with RVR (HCC)    -  Primary   Relevant Medications   diltiazem (CARDIZEM) injection 10 mg (Completed)   diltiazem (CARDIZEM) 125 mg in dextrose 5% 125 mL (1 mg/mL) infusion                   Final Clinical Impression(s) / ED Diagnoses Final diagnoses:  Atrial fibrillation with RVR [facility])     Rx / DC Orders ED Discharge Orders     None         Shon Baton, MD 09/14/22 [phone]

## 2022-09-14 NOTE — Plan of Care (Signed)

## 2022-09-14 NOTE — H&P (Signed)
History and Physical      Kim Burns ZHY:[id number] DOB: [birth year] DOA: 09/13/2022; DOS: 09/14/2022  PCP: Lewis Moccasin, MD  Patient coming from: home   I have personally briefly reviewed patient's old medical records in [facility]  Chief Complaint: Palpitations  HPI: Kim Burns is a 87 y.o. female with medical history significant for paroxysmal atrial fibrillation, end-stage renal disease on hemodialysis on Monday, Wednesday, Friday schedule, type 2 diabetes mellitus, essential hypertension, chronic diastolic heart failure, who is admitted to [facility] on 09/13/2022 with atrial fibrillation with RVR after presenting from home to [facility] ED complaining of palpitations.   The patient presents with 1 day of palpitations, which she states started after completion of her routine scheduled hemodialysis session on Friday, 09/13/2022.  She denies any associated chest pain, shortness of breath, diaphoresis, dizziness, presyncope, or syncope.  She also denies any recent cough, rhinitis, rhinorrhea, nor any recent subjective fever, chills, rigors, or generalized myalgias.  No recent headache, neck stiffness, rash, abdominal pain, or diarrhea.  She has a documented history of paroxysmal atrial fibrillation.  She was previously anticoagulated on Eliquis, but this was held in the context of a bleeding history, including history of gastrointestinal bleed in September 2023.  At this time, she is not on any formal anticoagulation as an outpatient.  Follows with Dr. Swaziland as her outpatient cardiologist.  AV nodal blocking regimen as an outpatient is noted to include Coreg.  Most recent echocardiogram occurred in July 2023 and was notable for LVEF 60 to 65%, no evidence of wall motion or maladies, grade 3 diastolic dysfunction, severely dilated bilateral atria, mild mitral regurgitation, mild to moderate tricuspid regurgitation, moderately reduced right ventricular systolic  function.    ED Course:  Vital signs in the ED were notable for the following: Afebrile; initial heart rates in the 160s, subsequently decreasing into the 120s to 130s following initiation of diltiazem drip, as further detailed below; systolic blood pressures in the 130s to 150s; respiratory rate 17-23, oxygen saturation 96 to 100% on room air.  Labs were notable for the following: BMP notable for sodium 136, potassium 3.6, bicarbonate 26, anion gap 15, glucose 167.  Serum magnesium level 2.4.  CBC notable for white blood cell count 10,000, hemoglobin 10.8 associated normocytic/normochromic properties and relative demonstration prior hemoglobin data point of 10.2 on 06/21/2022.  Per my interpretation, EKG in ED demonstrated the following: Atrial fibrillation with RVR, with heart rate 164, and no evidence of T wave or ST changes, including no evidence of ST elevation.  Imaging in the ED, per corresponding formal radiology read, was notable for the following: 1 view chest x-ray shows atelectasis versus infiltrate in the right middle and upper lungs, in the absence of any evidence of edema, effusion, or pneumothorax.  While in the ED, the following were administered: Diltiazem 10 mg IV x 1 dose followed by initiation of diltiazem drip.  Subsequently, the patient was admitted for further evaluation management of presenting atrial fibrillation with RVR.      Review of Systems: As per HPI otherwise 10 point review of systems negative.   Past Medical History:  Diagnosis Date   Anemia of chronic disease    takes iron   Anxiety    Blood transfusion without reported diagnosis    Bradycardia    Diabetes mellitus without complication (HCC)    became diabetic after Whipple procedure   Diarrhea    Dysrhythmia 04/16/2016   bradycardia due to  medication    ESRD on hemodialysis (HCC)    M-W-F   GERD (gastroesophageal reflux disease)    Gout    Headache    History of kidney stones 06/2013    Hyperparathyroidism (HCC)    Hypertension    PONV (postoperative nausea and vomiting)    Sleep apnea    no cpap machine. could not tolerate   Stroke (HCC) 04/16/2016   TIA 1995   Vitamin D deficiency     Past Surgical History:  Procedure Laterality Date   A/V FISTULAGRAM Left 08/30/2021   Procedure: A/V Fistulagram;  Surgeon: Cephus Shelling, MD;  Location: [facility] INVASIVE CV LAB;  Service: Cardiovascular;  Laterality: Left;   ABDOMINAL HYSTERECTOMY  1985   complete   BACK SURGERY  1980   lower   BASCILIC VEIN TRANSPOSITION Left 04/24/2017   Procedure: LEFT ARM FIRST STAGE BASILIC VEIN TRANSPOSITION;  Surgeon: Nada Libman, MD;  Location: [facility] OR;  Service: Vascular;  Laterality: Left;   BASCILIC VEIN TRANSPOSITION Left 07/10/2017   Procedure: SECOND STAGE BASILIC VEIN TRANSPOSITION LEFT ARM;  Surgeon: Nada Libman, MD;  Location: [facility] OR;  Service: Vascular;  Laterality: Left;   BREAST LUMPECTOMY Left x 2   many years apart, benign   CHOLECYSTECTOMY  1985   CYSTOSCOPY W/ URETERAL STENT PLACEMENT Left 10/31/2021   Procedure: CYSTOSCOPY WITH RETROGRADE PYELOGRAM/URETERAL STENT PLACEMENT;  Surgeon: Crist Fat, MD;  Location: [facility] OR;  Service: Urology;  Laterality: Left;   CYSTOSCOPY WITH URETEROSCOPY AND STENT PLACEMENT Left 06/18/2013   Procedure: CYSTOSCOPY WITH Lef URETEROSCOPY AND Left STENT PLACEMENT;  Surgeon: Crecencio Mc, MD;  Location: [facility] ORS;  Service: Urology;  Laterality: Left;   ESOPHAGOGASTRODUODENOSCOPY (EGD) WITH PROPOFOL N/A 11/13/2016   Procedure: ESOPHAGOGASTRODUODENOSCOPY (EGD) WITH PROPOFOL;  Surgeon: Kathi Der, MD;  Location: [facility] ENDOSCOPY;  Service: Gastroenterology;  Laterality: N/A;   EUS N/A 02/07/2016   Procedure: ESOPHAGEAL ENDOSCOPIC ULTRASOUND (EUS) RADIAL;  Surgeon: Willis Modena, MD;  Location: [facility] ENDOSCOPY;  Service: Endoscopy;  Laterality: N/A;   EYE SURGERY Bilateral 2014   ioc for catracts    FOOT SURGERY Left 1990   something  with toes unsure what    HERNIA REPAIR  2018   IR CATHETER TUBE CHANGE  03/14/2022   IR EMBO TUMOR ORGAN ISCHEMIA INFARCT INC GUIDE ROADMAPPING  08/22/2021   IR NEPHROSTOMY EXCHANGE LEFT  06/27/2022   IR NEPHROSTOMY EXCHANGE LEFT  08/26/2022   IR NEPHROSTOMY PLACEMENT LEFT  03/14/2022   IR RADIOLOGIST EVAL & MGMT  05/18/2021   IR RADIOLOGIST EVAL & MGMT  10/01/2021   IR RADIOLOGIST EVAL & MGMT  11/12/2021   IR RADIOLOGIST EVAL & MGMT  11/22/2021   IR RADIOLOGIST EVAL & MGMT  11/27/2021   IR RADIOLOGIST EVAL & MGMT  12/20/2021   IR RENAL SUPRASEL UNI S&I MOD SED  08/22/2021   IR US GUIDE VASC ACCESS RIGHT  08/22/2021   LEFT HEART CATH AND CORONARY ANGIOGRAPHY N/A 08/27/2021   Procedure: LEFT HEART CATH AND CORONARY ANGIOGRAPHY;  Surgeon: Kathleene Hazel, MD;  Location: [facility] INVASIVE CV LAB;  Service: Cardiovascular;  Laterality: N/A;   PARATHYROID EXPLORATION     PERIPHERAL VASCULAR BALLOON ANGIOPLASTY Left 08/30/2021   Procedure: PERIPHERAL VASCULAR BALLOON ANGIOPLASTY;  Surgeon: Cephus Shelling, MD;  Location: [facility] INVASIVE CV LAB;  Service: Cardiovascular;  Laterality: Left;  arm fistula   RADIOLOGY WITH ANESTHESIA Left 08/22/2021   Procedure: MICROWAVE ABLATION;  Surgeon: Simonne Come, MD;  Location:  [facility] ORS;  Service: Anesthesiology;  Laterality: Left;   WHIPPLE PROCEDURE N/A 04/23/2016   Procedure: WHIPPLE PROCEDURE;  Surgeon: Almond Lint, MD;  Location: [facility] OR;  Service: General;  Laterality: N/A;    Social History:  reports that she has never smoked. She has never used smokeless tobacco. She reports that she does not drink alcohol and does not use drugs.   Allergies  Allergen Reactions   Lopid [Gemfibrozil] Other (See Comments)    Myalgia    Lotemax [Loteprednol Etabonate] Rash    Skin burning  Blurry vision   Statins Other (See Comments)    Rosuvastatin Muscle weakness   Norco [Hydrocodone-Acetaminophen] Other (See Comments)    Headaches  Tolerates acetaminophen    Other  Diarrhea    Real Butter - diarrhea   Tomato Other (See Comments)    Belching    Vibra-Tab [Doxycycline] Other (See Comments)    Unknown reaction   Amlodipine Other (See Comments)    Norvasc   Codeine Other (See Comments)    headache   Hydrocodone Other (See Comments)   Hydrocodone-Acetaminophen Other (See Comments)    Headache. Tolerates acetaminophen.   Lisinopril Other (See Comments)   Verapamil Other (See Comments)    Family History  Problem Relation Age of Onset   Heart disease Mother    Stroke Mother    Cancer Father        colon   Alzheimer's disease Sister    Alzheimer's disease Sister    Cancer Brother        brain   Breast cancer Neg Hx     Family history reviewed and not pertinent    Prior to Admission medications   Medication Sig Start Date End Date Taking? Authorizing Provider  acetaminophen (TYLENOL) 325 MG tablet Take 2 tablets (650 mg total) by mouth every 6 (six) hours as needed for mild pain, fever or headache. Patient taking differently: Take 500 mg by mouth every 6 (six) hours as needed for mild pain, fever or headache. 01/30/22   Elgergawy, Leana Roe, MD  amiodarone (PACERONE) 200 MG tablet Take 0.5 tablets (100 mg total) by mouth daily. Please keep appointment for further refills 11/23/21   [place], Peter M, MD  B Complex-C-Zn-Folic Acid (DIALYVITE 800 WITH ZINC) 0.8 MG TABS Take 1 tablet by mouth daily.    [provider]  carvedilol (COREG) 3.125 MG tablet TAKE 1 TABLET BY MOUTH TWICE DAILY WITH A MEAL .  TAKE  ON  THE  DAYS  YOU  DO  NOT  HAVE  DIALYSIS. 08/12/22   [place], Peter M, MD  Cholecalciferol (VITAMIN D-3 PO) Take 1 capsule by mouth every Monday, Wednesday, and Friday with hemodialysis.    [provider]  colestipol (COLESTID) 1 g tablet Take 1 g by mouth See admin instructions. 1 tablet (1g) prior to dialysis M-W-F + 1 tablet daily PRN diarrhea    [provider]  diclofenac sodium (VOLTAREN) 1 % GEL Apply 2 g  topically 4 (four) times daily as needed (for hand pain).    [provider]  folic acid (FOLVITE) 1 MG tablet Take 1 tablet (1 mg total) by mouth daily. 01/31/22   Elgergawy, Leana Roe, MD  insulin aspart (NOVOLOG) 100 UNIT/ML injection Inject 0-6 Units into the skin 3 (three) times daily with meals. 01/30/22   Elgergawy, Leana Roe, MD  lidocaine-prilocaine (EMLA) cream Apply 1 Application topically every Monday, Wednesday, and Friday with hemodialysis.    [provider]  lipase/protease/amylase (CREON) 36000 UNITS CPEP capsule Take 36,000 Units by mouth 2 (two) times daily with a meal.    [provider]  midodrine (PROAMATINE) 10 MG tablet Take 10 mg on dialysis days Mon-Wed-Fri 06/13/22   [place], Peter M, MD  olopatadine (PATADAY) 0.1 % ophthalmic solution Place 1 drop into both eyes 2 (two) times daily.    [provider]  RESTASIS 0.05 % ophthalmic emulsion 1 drop 2 (two) times daily. 11/06/21   [provider]  rOPINIRole (REQUIP) 0.25 MG tablet Take 0.25 mg by mouth at bedtime. 12/11/21   [provider]  SENSIPAR 30 MG tablet Take by mouth. 12/18/12   [provider]  sevelamer carbonate (RENVELA) 800 MG tablet Take 1,600 mg by mouth 3 (three) times daily with meals.    [provider]  XIIDRA 5 % SOLN Place 1 drop into both eyes at bedtime. 09/24/21   [provider]     Objective    Physical Exam: Vitals:   09/14/22 0241 09/14/22 0315 09/14/22 0316 09/14/22 0330  BP: (!) 144/89 (!) 136/97  (!) 157/102  Pulse: (!) 129 (!) 124  (!) 122  Resp: (!) 23 (!) 23  (!) 22  Temp:   98 F (36.7 C)   TempSrc:   Oral   SpO2: 99% 100%  99%    General: appears to be stated age; alert, oriented Skin: warm, dry, no rash Head:  AT/[place] Mouth:  Oral mucosa membranes appear dry, normal dentition Neck: supple; trachea midline Heart: Tachycardic, irregular; did not appreciate any M/R/G Lungs: CTAB, did not appreciate  any wheezes, rales, or rhonchi Abdomen: + BS; soft, ND, NT Vascular: 2+ pedal pulses b/l; 2+ radial pulses b/l Extremities: no peripheral edema, no muscle wasting Neuro: strength and sensation intact in upper and lower extremities b/l    Labs on Admission: I have personally reviewed following labs and imaging studies  CBC: Recent Labs  Lab 09/13/22 2342  WBC 10.0  HGB 10.8*  HCT 34.9*  MCV 92.8  PLT 226   Basic Metabolic Panel: Recent Labs  Lab 09/13/22 2342  NA 136  K 3.6  CL 95*  CO2 26  GLUCOSE 167*  BUN 34*  CREATININE 3.30*  CALCIUM 7.9*  MG 2.4   GFR: CrCl cannot be calculated (Unknown ideal weight.). Liver Function Tests: No results for input(s): "AST", "ALT", "ALKPHOS", "BILITOT", "PROT", "ALBUMIN" in the last 168 hours. No results for input(s): "LIPASE", "AMYLASE" in the last 168 hours. No results for input(s): "AMMONIA" in the last 168 hours. Coagulation Profile: No results for input(s): "INR", "PROTIME" in the last 168 hours. Cardiac Enzymes: No results for input(s): "CKTOTAL", "CKMB", "CKMBINDEX", "TROPONINI" in the last 168 hours. BNP (last 3 results) No results for input(s): "PROBNP" in the last 8760 hours. HbA1C: No results for input(s): "HGBA1C" in the last 72 hours. CBG: No results for input(s): "GLUCAP" in the last 168 hours. Lipid Profile: No results for input(s): "CHOL", "HDL", "LDLCALC", "TRIG", "CHOLHDL", "LDLDIRECT" in the last 72 hours. Thyroid Function Tests: No results for input(s): "TSH", "T4TOTAL", "FREET4", "T3FREE", "THYROIDAB" in the last 72 hours. Anemia Panel: No results for input(s): "VITAMINB12", "FOLATE", "FERRITIN", "TIBC", "IRON", "RETICCTPCT" in the last 72 hours. Urine analysis:    Component Value Date/Time   COLORURINE YELLOW 10/29/2021 1530   APPEARANCEUR HAZY (A) 10/29/2021 1530   LABSPEC 1.015 10/29/2021 1530   PHURINE 7.0 10/29/2021 1530   GLUCOSEU NEGATIVE 10/29/2021 1530   HGBUR SMALL (A) 10/29/2021 1530  BILIRUBINUR NEGATIVE 10/29/2021 1530   KETONESUR NEGATIVE 10/29/2021 1530   PROTEINUR >=300 (A) 10/29/2021 1530   UROBILINOGEN 1.0 06/18/2013 1633   NITRITE NEGATIVE 10/29/2021 1530   LEUKOCYTESUR NEGATIVE 10/29/2021 1530    Radiological Exams on Admission: DG Chest Portable 1 View  Result Date: 09/14/2022 CLINICAL DATA:  Palpitations EXAM: PORTABLE CHEST 1 VIEW COMPARISON:  Radiographs 08/09/2022 FINDINGS: Stable cardiomegaly. Aortic atherosclerotic calcification. Chronic bronchitic changes. Atelectasis or infiltrates in the right mid to upper lung. Right basilar atelectasis. No pleural effusion or pneumothorax. No displaced rib fractures. IMPRESSION: Atelectasis or infiltrates in the right mid/upper lung. Electronically Signed   By: Minerva Fester M.D.   On: 09/14/2022 00:20      Assessment/Plan   Principal Problem:   Atrial fibrillation with RVR (HCC) Active Problems:   End-stage renal disease on hemodialysis (HCC)   DM2 (diabetes mellitus, type 2) (HCC)   Chronic diastolic CHF (congestive heart failure) (HCC)   History of anemia due to chronic kidney disease   Essential hypertension      #) Atrial fibrillation with RVR: In the setting of a known h/o of paroxysmal atrial fibrillation, noted to be in A-fib RVR with initial HR's in the 160s, subsequently decreasing into the 120s to 130s following initiation of diltiazem drip . BP has tolerated both the RVR as well as ensuing initiation of diltiazem drip, without any overt hypotension.  In the setting of her atrial fibrillation with RVR, she was symptomatic with presenting palpitations, in the absence of any associated chest pain or shortness of breath.  Unclear etiology leading to this exacerbation. ACS felt to be less likely in the absence of any recent CP, while presenting EKG shows no evidence of acute ischemic changes.  In considering the possibility of underlying infectious contribution, chest x-ray is notable for atelectasis  versus infiltrate involving the right middle and upper lung.  However, via clinical correlation, suspect that these radiographic findings favor atelectasis, as the patient has no acute respiratory symptoms nor any constitutional symptoms that would be suggestive of underlying infectious process.  Will add on procalcitonin level to further assess.  No clinical or radiographic evidence to suggest contributory or resultant acutely decompensated heart failure.  Most recent echocardiogram was performed in July 2023, notable for severely dilated bilateral atria, serving as a predisposing factor for atrial fibrillation, also noting mild mitral regurgitation and mild/moderate tricuspid regurgitation. In the setting of CHA2DS2-VASc score of 5, there is however, in the setting of her history of an indication for chronic anticoagulation for thromboembolic prophylaxis.  Bleeding, including GI bleed in the fall 2023, she is not currently on formal anticoagulation, as further detailed above.  Home AV nodal blocking regimen: Coreg.  Additionally, it appears that a rhythm control strategy is also pursued as an outpatient, the patient on daily oral amiodarone at home.   Plan: Monitor strict I's & O's and daily weights. Monitor on telemetry.  Repeat serum Mg level in the morning.  repeat CMP/CBC in the AM.  Continue diltiazem drip.  Hold home Coreg for now, with plan to resume prior to eventual discontinuation of dilt drip.  close monitoring of ensuing BP via routine VS. continue home amiodarone.  Check procalcitonin, TSH.                      #) ESRD: on HD (schedule: Monday, Wednesday, Friday). Next due for routine HD on Monday, 09/16/2022,. No clinical evidence for urgent overnight HD or to expedite HD  relative to this timeframe.   Plan: monitor strict I's/O's, daily weights. CMP in the AM. Check mag level in the morning.  Continue home Renvela.                         #)  Chronic diastolic heart failure: documented history of such, with most recent echocardiogram performed in July 2023, which was notable for LVEF 60 to 65%, grade 3 diastolic dysfunction, as well as moderately reduced right ventricular systolic function. No clinical or radiographic evidence to suggest acutely decompensated heart failure at this time, which is notable in the context of her presenting atrial fibrillation with RVR.   Plan: monitor strict I's & O's and daily weights. Repeat CMP in AM. Check serum mag level in the morning.                   #) Type 2 Diabetes Mellitus: documented history of such. Home insulin regimen: NovoLog sliding scale 3 times daily. Home oral hypoglycemic agents: None. presenting blood sugar: 167. Most recent A1c noted to be 6.6% when checked in December 2023.   Plan: accuchecks QAC and HS with low dose SSI.  Add on hemoglobin A1c.                #) Essential Hypertension: documented h/o such, with outpatient antihypertensive regimen including Coreg.  SBP's in the ED today: 130s to 150s mmHg, which is notable as it appears that her blood pressures are tolerating presenting atrial fibrillation with RVR as well as ensuing initiation of diltiazem drip..   Plan: Close monitoring of subsequent BP via routine VS. holding home Coreg while on diltiazem drip.               #) Anemia of chronic kidney disease: Documented history of such, a/w with baseline hgb range 9-11, with presenting hgb consistent with this range, in the absence of any overt evidence of active bleed.     Plan: Repeat CBC in the morning.       DVT prophylaxis: SCD's   Code Status: Full code Family Communication: none Disposition Plan: Per Rounding Team Consults called: none;  Admission status: obs     I SPENT GREATER THAN 75  MINUTES IN CLINICAL CARE TIME/MEDICAL DECISION-MAKING IN COMPLETING THIS ADMISSION.      Chaney Born Payden Bonus DO [facility]  From 7PM - 7AM   09/14/2022, 3:53 AM

## 2022-09-14 NOTE — Progress Notes (Addendum)
PROGRESS NOTE    Kim Burns  XLK:[id number] DOB: 1935-07-22 DOA: 09/13/2022 PCP: Lewis Moccasin, MD  Chief Complaint  Patient presents with   Weakness   Fatigue   Tachycardia    Brief Narrative:   Kim Burns is Kim Burns 87 y.o. female with medical history significant for paroxysmal atrial fibrillation, end-stage renal disease on hemodialysis on Monday, Wednesday, Friday schedule, type 2 diabetes mellitus, essential hypertension, chronic diastolic heart failure, who is admitted to [facility] on 09/13/2022 with atrial fibrillation with RVR after presenting from home to [facility] ED complaining of palpitations.   Assessment & Plan:   Principal Problem:   Atrial fibrillation with RVR (HCC) Active Problems:   End-stage renal disease on hemodialysis (HCC)   DM2 (diabetes mellitus, type 2) (HCC)   Chronic diastolic CHF (congestive heart failure) (HCC)   History of anemia due to chronic kidney disease   Essential hypertension  5:48 PM Addendum: daughter concerned about patient, thinks she may have had Kim Burns stroke.  Difficulty with speech, confusion.  Noted last night.  On exam some dysarthria noted, CN 2-12 otherwise intact, mild R sided ataxia with FNF, symmetric upper (4+/5) and lower extremity strength (4/5).  Will follow head CT and MRI brain.  Not quite clear when this started, sometime after her presentation (presented to ED around midnight).  Atrial Fibrillation with RVR Unclear precipitant  TSH wnl, CXR with atelectasis/infiltrates in R mid/upper lung Had converted on amiodarone without recurrence after ablation procedure July 2023 - was on low dose amiodarone and coreg prior to admission Currently on dilt gtt, PO amiodarone - rates still fast - will start low dose metop as she has room with BP, consider IV amiodarone  Coreg currently on hold (will try metop instead) Not on anticoagulation after discussion with cardiology outpatient Will consult cardiology for assistance  if needed  ESRD  Volume Overload  Bilateral Lower Extremity Edema Volume per renal with dialysis, will consult in AM if going to be here until monday Does seem overloaded with LE edema, no resp symptoms  Diastolic Heart Failure Moderately Reduced RVSF Moderately Elevated PASP Volume per renal  Echo 08/2021 with moderately reduced RVSF, moderately elevated PASP, grade III diastolic dysfunction   Prolonged Qtc Repeat EKG, caution with qt prolonging meds (May need to stop amiodarone)  T2DM SSI  A1c pending  Hypertension On coreg, but uses midodrine on dialysis days Coreg currently on hold, follow BP on metop/diltiazem  Anemia of Chronic Kidney Disease Noted  Elevated LFT's Noted, will trend  RLS Requip     DVT prophylaxis: SCD Code Status: full Family Communication: called daughter, granddaughter - no answer Disposition:   Status is: Observation The patient remains OBS appropriate and will d/c before 2 midnights.   Consultants:  renal  Procedures:  none  Antimicrobials:  Anti-infectives (From admission, onward)    None       Subjective: Asking for help opening breakfast tray Legs are bothering her - RLS - otherwise, she says ask her daughters for details  Called daughter, no answer  Objective: Vitals:   09/14/22 0530 09/14/22 0600 09/14/22 0658 09/14/22 0715  BP: (!) 141/99 132/89  (!) 158/93  Pulse: (!) 115 (!) 113  (!) 109  Resp: 19 (!) 25  16  Temp:   98 F (36.7 C)   TempSrc:   Oral   SpO2: 99% 98%  100%   No intake or output data in the 24 hours ending 09/14/22 0906 There were  no vitals filed for this visit.  Examination:  General exam: Appears calm and comfortable  Respiratory system: Clear to auscultation. Cardiovascular system: tachy, irregularly irregular Gastrointestinal system: Abdomen is nondistended, soft and nontender.  Central nervous system: Alert and oriented. No focal neurological deficits. Extremities: bilateral 1 +  LE edema    Data Reviewed: I have personally reviewed following labs and imaging studies  CBC: Recent Labs  Lab 09/13/22 2342 09/14/22 0506  WBC 10.0 9.7  NEUTROABS  --  8.3*  HGB 10.8* 10.5*  HCT 34.9* 34.1*  MCV 92.8 92.7  PLT 226 233    Basic Metabolic Panel: Recent Labs  Lab 09/13/22 2342 09/14/22 0506  NA 136 135  K 3.6 3.7  CL 95* 95*  CO2 26 24  GLUCOSE 167* 175*  BUN 34* 38*  CREATININE 3.30* 3.40*  CALCIUM 7.9* 7.6*  MG 2.4 2.2    GFR: CrCl cannot be calculated (Unknown ideal weight.).  Liver Function Tests: Recent Labs  Lab 09/14/22 0506  AST 51*  ALT 90*  ALKPHOS 72  BILITOT 0.4  PROT 5.8*  ALBUMIN 2.7*    CBG: Recent Labs  Lab 09/14/22 0750  GLUCAP 174*     No results found for this or any previous visit (from the past 240 hour(s)).       Radiology Studies: DG Chest Portable 1 View  Result Date: 09/14/2022 CLINICAL DATA:  Palpitations EXAM: PORTABLE CHEST 1 VIEW COMPARISON:  Radiographs 08/09/2022 FINDINGS: Stable cardiomegaly. Aortic atherosclerotic calcification. Chronic bronchitic changes. Atelectasis or infiltrates in the right mid to upper lung. Right basilar atelectasis. No pleural effusion or pneumothorax. No displaced rib fractures. IMPRESSION: Atelectasis or infiltrates in the right mid/upper lung. Electronically Signed   By: Minerva Fester M.D.   On: 09/14/2022 00:20        Scheduled Meds:  amiodarone  100 mg Oral Daily   insulin aspart  0-6 Units Subcutaneous TID WC   sevelamer carbonate  1,600 mg Oral TID WC   Continuous Infusions:  diltiazem (CARDIZEM) infusion 15 mg/hr (09/14/22 0320)     LOS: 0 days    Time spent: over 30 min    Lacretia Nicks, MD [facility]   To contact the attending provider between 7A-7P or the covering provider during after hours 7P-7A, please log into the web site [email or web address] and access using universal [place] password for that web site. If you do not have the  password, please call the hospital operator.  09/14/2022, 9:06 AM

## 2022-09-15 ENCOUNTER — Observation Stay (HOSPITAL_COMMUNITY): Payer: Medicare PPO

## 2022-09-15 DIAGNOSIS — K219 Gastro-esophageal reflux disease without esophagitis: Secondary | ICD-10-CM | POA: Diagnosis present

## 2022-09-15 DIAGNOSIS — R7401 Elevation of levels of liver transaminase levels: Secondary | ICD-10-CM | POA: Diagnosis present

## 2022-09-15 DIAGNOSIS — I4891 Unspecified atrial fibrillation: Secondary | ICD-10-CM

## 2022-09-15 DIAGNOSIS — E871 Hypo-osmolality and hyponatremia: Secondary | ICD-10-CM | POA: Diagnosis not present

## 2022-09-15 DIAGNOSIS — T8089XA Other complications following infusion, transfusion and therapeutic injection, initial encounter: Secondary | ICD-10-CM | POA: Diagnosis not present

## 2022-09-15 DIAGNOSIS — G2581 Restless legs syndrome: Secondary | ICD-10-CM | POA: Diagnosis not present

## 2022-09-15 DIAGNOSIS — M898X9 Other specified disorders of bone, unspecified site: Secondary | ICD-10-CM | POA: Diagnosis present

## 2022-09-15 DIAGNOSIS — G9341 Metabolic encephalopathy: Secondary | ICD-10-CM | POA: Diagnosis not present

## 2022-09-15 DIAGNOSIS — R002 Palpitations: Secondary | ICD-10-CM | POA: Diagnosis present

## 2022-09-15 DIAGNOSIS — J9811 Atelectasis: Secondary | ICD-10-CM | POA: Diagnosis present

## 2022-09-15 DIAGNOSIS — N186 End stage renal disease: Secondary | ICD-10-CM | POA: Diagnosis not present

## 2022-09-15 DIAGNOSIS — I132 Hypertensive heart and chronic kidney disease with heart failure and with stage 5 chronic kidney disease, or end stage renal disease: Secondary | ICD-10-CM | POA: Diagnosis not present

## 2022-09-15 DIAGNOSIS — I5032 Chronic diastolic (congestive) heart failure: Secondary | ICD-10-CM | POA: Diagnosis present

## 2022-09-15 DIAGNOSIS — S5011XA Contusion of right forearm, initial encounter: Secondary | ICD-10-CM | POA: Diagnosis not present

## 2022-09-15 DIAGNOSIS — I480 Paroxysmal atrial fibrillation: Secondary | ICD-10-CM | POA: Diagnosis not present

## 2022-09-15 DIAGNOSIS — B0230 Zoster ocular disease, unspecified: Secondary | ICD-10-CM | POA: Diagnosis not present

## 2022-09-15 DIAGNOSIS — E1122 Type 2 diabetes mellitus with diabetic chronic kidney disease: Secondary | ICD-10-CM | POA: Diagnosis not present

## 2022-09-15 DIAGNOSIS — B0052 Herpesviral keratitis: Secondary | ICD-10-CM | POA: Diagnosis not present

## 2022-09-15 DIAGNOSIS — G253 Myoclonus: Secondary | ICD-10-CM | POA: Diagnosis present

## 2022-09-15 DIAGNOSIS — Z794 Long term (current) use of insulin: Secondary | ICD-10-CM | POA: Diagnosis not present

## 2022-09-15 DIAGNOSIS — I959 Hypotension, unspecified: Secondary | ICD-10-CM | POA: Diagnosis not present

## 2022-09-15 DIAGNOSIS — N189 Chronic kidney disease, unspecified: Secondary | ICD-10-CM | POA: Diagnosis not present

## 2022-09-15 DIAGNOSIS — E559 Vitamin D deficiency, unspecified: Secondary | ICD-10-CM | POA: Diagnosis present

## 2022-09-15 DIAGNOSIS — Z992 Dependence on renal dialysis: Secondary | ICD-10-CM | POA: Diagnosis not present

## 2022-09-15 DIAGNOSIS — R9431 Abnormal electrocardiogram [ECG] [EKG]: Secondary | ICD-10-CM | POA: Diagnosis present

## 2022-09-15 DIAGNOSIS — I10 Essential (primary) hypertension: Secondary | ICD-10-CM | POA: Diagnosis not present

## 2022-09-15 DIAGNOSIS — D631 Anemia in chronic kidney disease: Secondary | ICD-10-CM | POA: Diagnosis not present

## 2022-09-15 DIAGNOSIS — M109 Gout, unspecified: Secondary | ICD-10-CM | POA: Diagnosis present

## 2022-09-15 LAB — COMPREHENSIVE METABOLIC PANEL WITH GFR
ALT: 72 U/L — ABNORMAL HIGH (ref 0–44)
AST: 27 U/L (ref 15–41)
Albumin: 2.8 g/dL — ABNORMAL LOW (ref 3.5–5.0)
Alkaline Phosphatase: 68 U/L (ref 38–126)
Anion gap: 15 (ref 5–15)
BUN: 48 mg/dL — ABNORMAL HIGH (ref 8–23)
CO2: 22 mmol/L (ref 22–32)
Calcium: 7.5 mg/dL — ABNORMAL LOW (ref 8.9–10.3)
Chloride: 94 mmol/L — ABNORMAL LOW (ref 98–111)
Creatinine, Ser: 4.03 mg/dL — ABNORMAL HIGH (ref 0.44–1.00)
GFR, Estimated: 10 mL/min — ABNORMAL LOW (ref >60)
Glucose, Bld: 123 mg/dL — ABNORMAL HIGH (ref 70–99)
Potassium: 4.2 mmol/L (ref 3.5–5.1)
Sodium: 131 mmol/L — ABNORMAL LOW (ref 135–145)
Total Bilirubin: 0.7 mg/dL (ref 0.3–1.2)
Total Protein: 5.7 g/dL — ABNORMAL LOW (ref 6.5–8.1)

## 2022-09-15 LAB — CBC WITH DIFFERENTIAL/PLATELET
Abs Immature Granulocytes: 0.06 10*3/uL (ref 0.00–0.07)
Basophils Absolute: 0 10*3/uL (ref 0.0–0.1)
Basophils Relative: 0 %
Eosinophils Absolute: 0.1 10*3/uL (ref 0.0–0.5)
Eosinophils Relative: 1 %
HCT: 32.3 % — ABNORMAL LOW (ref 36.0–46.0)
Hemoglobin: 10.2 g/dL — ABNORMAL LOW (ref 12.0–15.0)
Immature Granulocytes: 1 %
Lymphocytes Relative: 7 %
Lymphs Abs: 0.9 10*3/uL (ref 0.7–4.0)
MCH: 29 pg (ref 26.0–34.0)
MCHC: 31.6 g/dL (ref 30.0–36.0)
MCV: 91.8 fL (ref 80.0–100.0)
Monocytes Absolute: 0.7 10*3/uL (ref 0.1–1.0)
Monocytes Relative: 5 %
Neutro Abs: 11.1 10*3/uL — ABNORMAL HIGH (ref 1.7–7.7)
Neutrophils Relative %: 86 %
Platelets: 236 10*3/uL (ref 150–400)
RBC: 3.52 MIL/uL — ABNORMAL LOW (ref 3.87–5.11)
RDW: 18.1 % — ABNORMAL HIGH (ref 11.5–15.5)
WBC: 12.8 10*3/uL — ABNORMAL HIGH (ref 4.0–10.5)
nRBC: 0 % (ref 0.0–0.2)

## 2022-09-15 LAB — GLUCOSE, CAPILLARY
Glucose-Capillary: 167 mg/dL — ABNORMAL HIGH (ref 70–99)
Glucose-Capillary: 186 mg/dL — ABNORMAL HIGH (ref 70–99)
Glucose-Capillary: 178 mg/dL — ABNORMAL HIGH (ref 70–99)
Glucose-Capillary: 169 mg/dL — ABNORMAL HIGH (ref 70–99)
Glucose-Capillary: 160 mg/dL — ABNORMAL HIGH (ref 70–99)

## 2022-09-15 LAB — PHOSPHORUS: Phosphorus: 9 mg/dL — ABNORMAL HIGH (ref 2.5–4.6)

## 2022-09-15 LAB — ECHOCARDIOGRAM COMPLETE
S' Lateral: 3.3 cm
Weight: 1862.45 [oz_av]

## 2022-09-15 LAB — MAGNESIUM: Magnesium: 2.2 mg/dL (ref 1.7–2.4)

## 2022-09-15 LAB — HEPATITIS B SURFACE ANTIGEN: Hepatitis B Surface Ag: NONREACTIVE

## 2022-09-15 LAB — HEPATITIS B SURFACE ANTIBODY,QUALITATIVE: Hep B S Ab: NONREACTIVE

## 2022-09-15 MED ORDER — CHLORHEXIDINE GLUCONATE CLOTH 2 % EX PADS
6.0000 | MEDICATED_PAD | Freq: Every day | CUTANEOUS | Status: DC
  Administered 2022-09-16 – 2022-09-17 (×2): 6 via TOPICAL

## 2022-09-15 MED ORDER — DIAZEPAM 5 MG/ML IJ SOLN
2.5000 mg | Freq: Four times a day (QID) | INTRAMUSCULAR | Status: DC | PRN
  Administered 2022-09-15 – 2022-09-17 (×3): 2.5 mg via INTRAVENOUS
  Filled 2022-09-15 (×3): qty 2

## 2022-09-15 MED ORDER — CHLORHEXIDINE GLUCONATE CLOTH 2 % EX PADS
6.0000 | MEDICATED_PAD | Freq: Every day | CUTANEOUS | Status: DC
  Administered 2022-09-16 – 2022-09-24 (×8): 6 via TOPICAL

## 2022-09-15 MED ORDER — METOPROLOL TARTRATE 25 MG PO TABS
25.0000 mg | ORAL_TABLET | Freq: Two times a day (BID) | ORAL | Status: DC
  Administered 2022-09-15 – 2022-09-19 (×5): 25 mg via ORAL
  Filled 2022-09-15 (×5): qty 1

## 2022-09-15 NOTE — Consult Note (Signed)
Renal Service Consult Note [facility]  Kim Burns 09/15/2022 Kim Krabbe, MD Requesting Physician: Dr. Lowell Guitar  Reason for Consult: ESRD pt w/ atrial fib and AMS HPI: The patient is a 87 y.o. year-old w/ PMH as below who presented to ED on 7/26 w/ c/o gen weakness and fatigue, hx of afib. EMS showed afib RVR at 150- 170 hr. She has hx of afib f/b Dr [place]. Labs showed K 3.6, mg 2.4, Hb 10.8. CXR showed question infiltrate RUL o/w negative. In ED IV diltiazem was started as drip after bolus. Pt was admitted. We are asked to see her for dialysis.   Pt seen in room.  Pt's daughter at bedside who notes pt's mental status has declined rapidly the last few days, she started taking valtrex on Thursday and took it Friday at home prior to coming to ED. She got one dose last night of valtrex 1 gm and then is was dc'd this am. Pt states her MS was okay up until yesterday 7/27.   ROS - denies CP, no joint pain, no HA, no blurry vision, no rash, no diarrhea, no nausea/ vomiting, no dysuria, no difficulty voiding   Past Medical History  Past Medical History:  Diagnosis Date   Anemia of chronic disease    takes iron   Anxiety    Blood transfusion without reported diagnosis    Bradycardia    Diabetes mellitus without complication (HCC)    became diabetic after Whipple procedure   Diarrhea    Dysrhythmia 04/16/2016   bradycardia due to medication    ESRD on hemodialysis [facility])    M-W-F   GERD (gastroesophageal reflux disease)    Gout    Headache    History of kidney stones 06/2013   Hyperparathyroidism (HCC)    Hypertension    PONV (postoperative nausea and vomiting)    Sleep apnea    no cpap machine. could not tolerate   Stroke (HCC) 04/16/2016   TIA 1995   Vitamin D deficiency    Past Surgical History  Past Surgical History:  Procedure Laterality Date   A/V FISTULAGRAM Left 08/30/2021   Procedure: A/V Fistulagram;  Surgeon: Cephus Shelling, MD;   Location[facility] INVASIVE CV LAB;  Service: Cardiovascular;  Laterality: Left;   ABDOMINAL HYSTERECTOMY  1985   complete   BACK SURGERY  1980   lower   BASCILIC VEIN TRANSPOSITION Left 04/24/2017   Procedure: LEFT ARM FIRST STAGE BASILIC VEIN TRANSPOSITION;  Surgeon: Nada Libman, MD;  Location: [facility] OR;  Service: Vascular;  Laterality: Left;   BASCILIC VEIN TRANSPOSITION Left 07/10/2017   Procedure: SECOND STAGE BASILIC VEIN TRANSPOSITION LEFT ARM;  Surgeon: Nada Libman, MD;  Location: [facility] OR;  Service: Vascular;  Laterality: Left;   BREAST LUMPECTOMY Left x 2   many years apart, benign   CHOLECYSTECTOMY  1985   CYSTOSCOPY W/ URETERAL STENT PLACEMENT Left 10/31/2021   Procedure: CYSTOSCOPY WITH RETROGRADE PYELOGRAM/URETERAL STENT PLACEMENT;  Surgeon: Crist Fat, MD;  Location: [facility] OR;  Service: Urology;  Laterality: Left;   CYSTOSCOPY WITH URETEROSCOPY AND STENT PLACEMENT Left 06/18/2013   Procedure: CYSTOSCOPY WITH Lef URETEROSCOPY AND Left STENT PLACEMENT;  Surgeon: Crecencio Mc, MD;  Location: [facility] ORS;  Service: Urology;  Laterality: Left;   ESOPHAGOGASTRODUODENOSCOPY (EGD) WITH PROPOFOL N/A 11/13/2016   Procedure: ESOPHAGOGASTRODUODENOSCOPY (EGD) WITH PROPOFOL;  Surgeon: Kathi Der, MD;  Location: [facility] ENDOSCOPY;  Service: Gastroenterology;  Laterality: N/A;   EUS N/A 02/07/2016  Procedure: ESOPHAGEAL ENDOSCOPIC ULTRASOUND (EUS) RADIAL;  Surgeon: Willis Modena, MD;  Location: [facility] ENDOSCOPY;  Service: Endoscopy;  Laterality: N/A;   EYE SURGERY Bilateral 2014   ioc for catracts    FOOT SURGERY Left 1990   something with toes unsure what    HERNIA REPAIR  2018   IR CATHETER TUBE CHANGE  03/14/2022   IR EMBO TUMOR ORGAN ISCHEMIA INFARCT INC GUIDE ROADMAPPING  08/22/2021   IR NEPHROSTOMY EXCHANGE LEFT  06/27/2022   IR NEPHROSTOMY EXCHANGE LEFT  08/26/2022   IR NEPHROSTOMY PLACEMENT LEFT  03/14/2022   IR RADIOLOGIST EVAL & MGMT  05/18/2021   IR RADIOLOGIST EVAL & MGMT  10/01/2021    IR RADIOLOGIST EVAL & MGMT  11/12/2021   IR RADIOLOGIST EVAL & MGMT  11/22/2021   IR RADIOLOGIST EVAL & MGMT  11/27/2021   IR RADIOLOGIST EVAL & MGMT  12/20/2021   IR RENAL SUPRASEL UNI S&I MOD SED  08/22/2021   IR US GUIDE VASC ACCESS RIGHT  08/22/2021   LEFT HEART CATH AND CORONARY ANGIOGRAPHY N/A 08/27/2021   Procedure: LEFT HEART CATH AND CORONARY ANGIOGRAPHY;  Surgeon: Kathleene Hazel, MD;  Location: [facility] INVASIVE CV LAB;  Service: Cardiovascular;  Laterality: N/A;   PARATHYROID EXPLORATION     PERIPHERAL VASCULAR BALLOON ANGIOPLASTY Left 08/30/2021   Procedure: PERIPHERAL VASCULAR BALLOON ANGIOPLASTY;  Surgeon: Cephus Shelling, MD;  Location: [facility] INVASIVE CV LAB;  Service: Cardiovascular;  Laterality: Left;  arm fistula   RADIOLOGY WITH ANESTHESIA Left 08/22/2021   Procedure: MICROWAVE ABLATION;  Surgeon: Simonne Come, MD;  Location: [facility] ORS;  Service: Anesthesiology;  Laterality: Left;   WHIPPLE PROCEDURE N/A 04/23/2016   Procedure: WHIPPLE PROCEDURE;  Surgeon: Almond Lint, MD;  Location: [facility] OR;  Service: General;  Laterality: N/A;   Family History  Family History  Problem Relation Age of Onset   Heart disease Mother    Stroke Mother    Cancer Father        colon   Alzheimer's disease Sister    Alzheimer's disease Sister    Cancer Brother        brain   Breast cancer Neg Hx    Social History  reports that she has never smoked. She has never used smokeless tobacco. She reports that she does not drink alcohol and does not use drugs. Allergies  Allergies  Allergen Reactions   Lopid [Gemfibrozil] Other (See Comments)    Myalgia    Lotemax [Loteprednol Etabonate] Rash    Skin burning  Blurry vision   Statins Other (See Comments)    Rosuvastatin Muscle weakness   Norco [Hydrocodone-Acetaminophen] Other (See Comments)    Headaches  Tolerates acetaminophen    Other Diarrhea    Real Butter - diarrhea   Tomato Other (See Comments)    Belching    Vibra-Tab [Doxycycline]  Other (See Comments)    Unknown reaction   Amlodipine Other (See Comments)    Norvasc   Codeine Other (See Comments)    headache   Hydrocodone Other (See Comments)   Hydrocodone-Acetaminophen Other (See Comments)    Headache. Tolerates acetaminophen.   Lisinopril Other (See Comments)   Verapamil Other (See Comments)   Home medications Prior to Admission medications   Medication Sig Start Date End Date Taking? Authorizing Provider  acetaminophen (TYLENOL) 325 MG tablet Take 2 tablets (650 mg total) by mouth every 6 (six) hours as needed for mild pain, fever or headache. Patient taking differently: Take 500 mg by mouth  every 6 (six) hours as needed for mild pain, fever or headache. 01/30/22  Yes Elgergawy, Leana Roe, MD  amiodarone (PACERONE) 200 MG tablet Take 0.5 tablets (100 mg total) by mouth daily. Please keep appointment for further refills 11/23/21  Yes [place], Peter M, MD  B Complex-C-Zn-Folic Acid (DIALYVITE 800 WITH ZINC) 0.8 MG TABS Take 1 tablet by mouth daily.   Yes [provider]  Brinzolamide-Brimonidine 1-0.2 % SUSP Apply 1 drop to eye 2 (two) times daily.   Yes [provider]  carvedilol (COREG) 3.125 MG tablet TAKE 1 TABLET BY MOUTH TWICE DAILY WITH A MEAL .  TAKE  ON  THE  DAYS  YOU  DO  NOT  HAVE  DIALYSIS. Patient taking differently: Take 3.125 mg by mouth See admin instructions. TAKE 1 TABLET BY MOUTH TWICE DAILY WITH A MEAL . TAKE ON THE DAYS YOU DO NOT HAVE DIALYSIS. 08/12/22  Yes [place], Peter M, MD  Cholecalciferol (VITAMIN D-3 PO) Take 1 capsule by mouth every Monday, Wednesday, and Friday with hemodialysis.   Yes [provider]  colestipol (COLESTID) 1 g tablet Take 1 g by mouth See admin instructions. 1 tablet (1g) prior to dialysis M-W-F + 1 tablet daily PRN diarrhea   Yes [provider]  diclofenac sodium (VOLTAREN) 1 % GEL Apply 2 g topically 4 (four) times daily as needed (for hand pain).   Yes [provider]   folic acid (FOLVITE) 1 MG tablet Take 1 tablet (1 mg total) by mouth daily. 01/31/22  Yes Elgergawy, Leana Roe, MD  furosemide (LASIX) 20 MG tablet Take 40 mg by mouth 3 (three) times daily. 09/04/22  Yes [provider]  insulin aspart (NOVOLOG) 100 UNIT/ML injection Inject 0-6 Units into the skin 3 (three) times daily with meals. Patient taking differently: Inject 0-6 Units into the skin See admin instructions. Only if sugar is >200 for at least an hour 01/30/22  Yes Elgergawy, Leana Roe, MD  lidocaine-prilocaine (EMLA) cream Apply 1 Application topically every Monday, Wednesday, and Friday with hemodialysis.   Yes [provider]  lipase/protease/amylase (CREON) 36000 UNITS CPEP capsule Take 36,000 Units by mouth 2 (two) times daily with a meal.   Yes [provider]  midodrine (PROAMATINE) 10 MG tablet Take 10 mg on dialysis days Mon-Wed-Fri 06/13/22  Yes [place], Peter M, MD  neomycin-polymyxin b-dexamethasone (MAXITROL) 3.5-10000-0.1 SUSP Place 1 drop into the left eye 3 (three) times daily. 09/12/22  Yes [provider]  olopatadine (PATADAY) 0.1 % ophthalmic solution Place 1 drop into both eyes 2 (two) times daily.   Yes [provider]  prednisoLONE acetate (PRED FORTE) 1 % ophthalmic suspension Place 1 drop into the left eye 4 (four) times daily. 09/13/22  Yes [provider]  RESTASIS 0.05 % ophthalmic emulsion Place 1 drop into both eyes as needed (yes irratation). 11/06/21  Yes [provider]  rOPINIRole (REQUIP) 0.25 MG tablet Take 0.5 mg by mouth at bedtime. 12/11/21  Yes [provider]  SENSIPAR 30 MG tablet Take 30 mg by mouth with breakfast, with lunch, and with evening meal. 12/18/12  Yes [provider]  sevelamer carbonate (RENVELA) 800 MG tablet Take 1,600 mg by mouth 3 (three) times daily with meals.   Yes [provider]  valACYclovir (VALTREX) 1000 MG tablet Take 1,000 mg by mouth 3 (three)  times daily. 09/12/22  Yes [provider]  ciprofloxacin (CIPRO) 500 MG tablet Take 500 mg by mouth 2 (two)  times daily. Patient not taking: Reported on 09/14/2022 08/28/22   [provider]     Vitals:   09/15/22 0300 09/15/22 0715 09/15/22 0904 09/15/22 1100  BP: 123/80 (!) 158/89 (!) 167/83   Pulse: 88 88 89   Resp: 18 20 20    Temp: 97.8 F (36.6 C) 98.2 F (36.8 C)  98.2 F (36.8 C)  TempSrc: Oral Oral  Oral  SpO2: 92% 93% 93%   Weight: 52.8 kg      Exam Gen alert, no distress, sedated after valium dose No rash, cyanosis or gangrene Sclera anicteric, throat clear  No jvd or bruits Chest clear bilat to bases, no rales/ wheezing RRR no MRG Abd soft ntnd no mass or ascites +bs GU defer MS no joint effusions or deformity Ext no LE or UE edema, no wounds or ulcers Neuro is somnolent, sedated    LUA AVF+bruit    Home meds include - furosemide 40 tid, cipro, acetaminophen, amiodarone, b complex, carvedilol, vit D3, colestipol, diclofenac sod, folic acid, insulin aspart, emla cream, lipase/protease/ amylase, midodrine 10mg  pre hd mwf, neomycin-polymyxin -dexamethasone / olopatidine/ restasis/ prednisolone/ brinzolamide-brimonidine eye gtts, ropinirole, sensipar 30 qc, sevelamer 2 ac, valacyclovir 1 gm tid     OP HD: [facility] MWF  3.5h  350/ 1.5  55kg  2/2 bath  AVF   Heparin none - last OP HD 7/26, post wt 57.5kg   - coming up 2-3kg over dry wt for last 3 wks - hectorol 5 mcg IV three times per week - mircera 75 mcg IV q 2 wks, last 7/17, due 7/31   Assessment/ Plan: Afib w/ RVR - per pmd/ cardiology currently getting IV dilt gtt and po amio (was on at home). Metoprolol added, coreg on hold. Not on a/c. Per pmd.  AMS - likely due to excessive dosing of the valacyclovir at 1 gm tid; the adjustment for esrd patient would be 500 mg daily. Plan HD as below.  ESRD - on HD MWF, last HD Friday. Plan HD tonight as the valacylovir/ acyclovir is dialyzable at about 30-  60% removal over 4 hr HD. Can get HD again tomorrow if needed as well.  HTN/ volume - euvolemic on exam, 2kg under dry wt, BP's normal to a bit high. Will keep even on HD.  Anemia esrd - Hb 10-11 here, next esa due on 7/31. Follow.  MBD ckd - CCa in range, phos is high. Cont binder, renal diet and HD for high phos. Cont IV vdra, sensipar 30 tid.  DM2 - on insulin      Vinson Moselle  MD [facility] 09/15/2022, 12:47 PM  Recent Labs  Lab 09/14/22 0506 09/15/22 0036  HGB 10.5* 10.2*  ALBUMIN 2.7* 2.8*  CALCIUM 7.6* 7.5*  PHOS  --  9.0*  CREATININE 3.40* 4.03*  K 3.7 4.2   Inpatient medications:  amiodarone  100 mg Oral Daily   brinzolamide  1 drop Left Eye BID   And   brimonidine  1 drop Left Eye BID   cinacalcet  30 mg Oral TID with meals   insulin aspart  0-6 Units Subcutaneous TID WC   lipase/protease/amylase  36,000 Units Oral TID WC   metoprolol tartrate  25 mg Oral BID   neomycin-polymyxin b-dexamethasone  1 drop Left Eye TID   prednisoLONE acetate  1 drop Left Eye QID   rOPINIRole  0.25 mg Oral QHS   sevelamer carbonate  1,600 mg Oral TID WC    acetaminophen **OR** acetaminophen,  diazepam, melatonin, ondansetron (ZOFRAN) IV

## 2022-09-15 NOTE — Progress Notes (Signed)
PT Cancellation Note  Patient Details Name: Kim Burns MRN: [id number] DOB: 06-12-1935   Cancelled Treatment:    Reason Eval/Treat Not Completed: Medical issues which prohibited therapy. Pt with continued poor mental status and will have HD later. Will recheck tomorrow.    [name] [facility] 09/15/2022, 2:19 PM Skip Mayer PT Acute [employer] [phone]

## 2022-09-15 NOTE — Plan of Care (Signed)

## 2022-09-15 NOTE — Progress Notes (Signed)
EEG complete - results pending 

## 2022-09-15 NOTE — Progress Notes (Signed)
  Echocardiogram 2D Echocardiogram has been performed.  [name] 09/15/2022, 9:57 AM

## 2022-09-15 NOTE — Progress Notes (Signed)
Morning shift assessment- difficulty to arouse pt this morning, disoriented X4. CBG 186, BP 167/83. Room air sats 92%. MD paged (See new orders), coming up to assess pt.  Placed pt on 2L o2. Rapid response called for an extra eye on the pt.  Will continue to monitor pt.

## 2022-09-15 NOTE — Progress Notes (Signed)
PROGRESS NOTE    Kim Burns  JXB:[id number] DOB: 1935-09-25 DOA: 09/13/2022 PCP: Lewis Moccasin, MD  Chief Complaint  Patient presents with   Weakness   Fatigue   Tachycardia    Brief Narrative:   Kim Burns is Kim Burns 87 y.o. female with medical history significant for paroxysmal atrial fibrillation, end-stage renal disease on hemodialysis on Monday, Wednesday, Friday schedule, type 2 diabetes mellitus, essential hypertension, chronic diastolic heart failure, who is admitted to [facility] on 09/13/2022 with atrial fibrillation with RVR after presenting from home to [facility] ED complaining of palpitations.   Assessment & Plan:   Principal Problem:   Atrial fibrillation with RVR (HCC) Active Problems:   End-stage renal disease on hemodialysis (HCC)   DM2 (diabetes mellitus, type 2) (HCC)   Chronic diastolic CHF (congestive heart failure) (HCC)   History of anemia due to chronic kidney disease   Essential hypertension  Acute Metabolic Encephalopathy Myoclonus  Worsening encephalopathy overnight MRI was negative for stroke Will follow EEG and UA Appreciate nephrology's input, valtrex inappropriately dosed for her renal dysfunction -> this can cause myoclonus, delirium, encephalopathy, seizures, etc --- suspect this is the most likely cause of her encephalopathy and myoclonic jerks  --- they're planning to dialyze earlier potentially Valtrex currently on hold Valium ordered prn  Delirium precautions  Atrial Fibrillation with RVR Unclear precipitant  TSH wnl, CXR with atelectasis/infiltrates in R mid/upper lung Had converted on amiodarone without recurrence after ablation procedure July 2023 - was on low dose amiodarone and coreg prior to admission Now on metoprolol and amiodarone - rates appropriate Coreg currently on hold (will try metop instead) Not on anticoagulation after discussion with cardiology outpatient Will consult cardiology for assistance if  needed  ESRD  Volume Overload  Bilateral Lower Extremity Edema Volume per renal with dialysis, appreciate assistance Does seem overloaded with LE edema, no resp symptoms  Diastolic Heart Failure Moderately Reduced RVSF Moderately Elevated PASP Volume per renal  Echo 08/2021 with moderately reduced RVSF, moderately elevated PASP, grade III diastolic dysfunction   Prolonged Qtc Daily EKG Caution with qt prolonging meds  Herpes Opthalmicus? He was started on TID valtrex on Thursday for this Also getting prenisolone acetate ophthalmic suspension, neomycin-polymixin b- dexamethasone ophthalmic suspension, brinzolamide brimonidine ophthalmic suspension  Once she's better from neuro standpoint, will plan to resume valtrex with renal dosing   T2DM SSI  A1c pending  Hypertension On coreg, but uses midodrine on dialysis days Coreg currently on hold, follow BP on metop  Anemia of Chronic Kidney Disease Noted  Elevated LFT's Noted, will trend  RLS Requip     DVT prophylaxis: SCD Code Status: full Family Communication: daughter at bedside Disposition:   Status is: Observation The patient remains OBS appropriate and will d/c before 2 midnights.   Consultants:  renal  Procedures:  none  Antimicrobials:  Anti-infectives (From admission, onward)    Start     Dose/Rate Route Frequency Ordered Stop   09/14/22 2200  valACYclovir (VALTREX) tablet 1,000 mg  Status:  Discontinued        1,000 mg Oral 3 times daily 09/14/22 1716 09/15/22 0956       Subjective: Lethargic, awakens to voice  Objective: Vitals:   09/15/22 0240 09/15/22 0300 09/15/22 0715 09/15/22 0904  BP: (!) 161/125 123/80 (!) 158/89 (!) 167/83  Pulse:  88 88 89  Resp:  18 20 20   Temp:  97.8 F (36.6 C) 98.2 F (36.8 C)   TempSrc:  Oral Oral   SpO2:  92% 93% 93%  Weight:  52.8 kg      Intake/Output Summary (Last 24 hours) at 09/15/2022 1028 Last data filed at 09/15/2022 0900 Gross per 24 hour   Intake 404.87 ml  Output --  Net 404.87 ml   Filed Weights   09/15/22 0300  Weight: 52.8 kg    Examination:  General: No acute distress. Cardiovascular: irregularly irregular  Lungs: unlabored Abdomen: Soft, nontender, nondistended Neurological: lethargic, eyes closed moaning, responds to speech appropriately, follows some commands - upper extremities with some rigidity, myoclonus - lower extremities with normal range of motion, no rigidity, no clonus  Extremities: No clubbing or cyanosis. No edema.   Data Reviewed: I have personally reviewed following labs and imaging studies  CBC: Recent Labs  Lab 09/13/22 2342 09/14/22 0506 09/15/22 0036  WBC 10.0 9.7 12.8*  NEUTROABS  --  8.3* 11.1*  HGB 10.8* 10.5* 10.2*  HCT 34.9* 34.1* 32.3*  MCV 92.8 92.7 91.8  PLT 226 233 236    Basic Metabolic Panel: Recent Labs  Lab 09/13/22 2342 09/14/22 0506 09/15/22 0036  NA 136 135 131*  K 3.6 3.7 4.2  CL 95* 95* 94*  CO2 26 24 22   GLUCOSE 167* 175* 123*  BUN 34* 38* 48*  CREATININE 3.30* 3.40* 4.03*  CALCIUM 7.9* 7.6* 7.5*  MG 2.4 2.2 2.2  PHOS  --   --  9.0*    GFR: Estimated Creatinine Clearance: 8.1 mL/min (Lawrance Wiedemann) (by C-G formula based on SCr of 4.03 mg/dL (H)).  Liver Function Tests: Recent Labs  Lab 09/14/22 0506 09/15/22 0036  AST 51* 27  ALT 90* 72*  ALKPHOS 72 68  BILITOT 0.4 0.7  PROT 5.8* 5.7*  ALBUMIN 2.7* 2.8*    CBG: Recent Labs  Lab 09/14/22 1150 09/14/22 1650 09/14/22 2107 09/15/22 0601 09/15/22 0910  GLUCAP 133* 140* 134* 167* 186*     No results found for this or any previous visit (from the past 240 hour(s)).       Radiology Studies: MR BRAIN WO CONTRAST  Result Date: 09/14/2022 CLINICAL DATA:  Acute neurologic deficit EXAM: MRI HEAD WITHOUT CONTRAST TECHNIQUE: Multiplanar, multiecho pulse sequences of the brain and surrounding structures were obtained without intravenous contrast. COMPARISON:  None Available. FINDINGS: Brain: No  acute infarct, mass effect or extra-axial collection. Unchanged focus of magnetic susceptibility effect within the right parietal white matter, possibly Blayton Huttner cavernous malformation or remote hemorrhage. There is multifocal hyperintense T2-weighted signal within the white matter. Parenchymal volume and CSF spaces are normal. Old, small bilateral occipital and cerebellar infarcts. The midline structures are normal. Vascular: Major flow voids are preserved. Skull and upper cervical spine: Normal calvarium and skull base. Visualized upper cervical spine and soft tissues are normal. Sinuses/Orbits:No paranasal sinus fluid levels or advanced mucosal thickening. No mastoid or middle ear effusion. Normal orbits. IMPRESSION: 1. No acute intracranial abnormality. 2. Chronic small vessel ischemia and multiple old small vessel infarcts. Electronically Signed   By: Deatra Robinson M.D.   On: 09/14/2022 21:28   DG Chest Portable 1 View  Result Date: 09/14/2022 CLINICAL DATA:  Palpitations EXAM: PORTABLE CHEST 1 VIEW COMPARISON:  Radiographs 08/09/2022 FINDINGS: Stable cardiomegaly. Aortic atherosclerotic calcification. Chronic bronchitic changes. Atelectasis or infiltrates in the right mid to upper lung. Right basilar atelectasis. No pleural effusion or pneumothorax. No displaced rib fractures. IMPRESSION: Atelectasis or infiltrates in the right mid/upper lung. Electronically Signed   By: Minerva Fester M.D.   On:  09/14/2022 00:20        Scheduled Meds:  amiodarone  100 mg Oral Daily   brinzolamide  1 drop Left Eye BID   And   brimonidine  1 drop Left Eye BID   cinacalcet  30 mg Oral TID with meals   insulin aspart  0-6 Units Subcutaneous TID WC   lipase/protease/amylase  36,000 Units Oral TID WC   metoprolol tartrate  25 mg Oral BID   neomycin-polymyxin b-dexamethasone  1 drop Left Eye TID   prednisoLONE acetate  1 drop Left Eye QID   rOPINIRole  0.25 mg Oral QHS   sevelamer carbonate  1,600 mg Oral TID WC    Continuous Infusions:     LOS: 0 days    Time spent: over 30 min    Lacretia Nicks, MD [facility]   To contact the attending provider between 7A-7P or the covering provider during after hours 7P-7A, please log into the web site [email or web address] and access using universal [place] password for that web site. If you do not have the password, please call the hospital operator.  09/15/2022, 10:28 AM

## 2022-09-15 NOTE — Progress Notes (Signed)
PT Cancellation Note  Patient Details Name: Kim Burns MRN: [id number] DOB: 11/21/1935   Cancelled Treatment:    Reason Eval/Treat Not Completed: Other (comment). Nurse present and assessing the pt and asked [place] to defer until later today. Will recheck later.   [name] [facility] 09/15/2022, 9:11 AM Skip Mayer PT Acute [employer] [phone]

## 2022-09-15 NOTE — Procedures (Signed)
[facility] Tele[facility] Consult Services  Routine EEG Report  Video Performed: Performed  Demographics: Patient Name:   Kim Burns, Kim Burns  Date of Birth:   Dec 02, 1935  Identification Number:   MRN - [id number]  Study Times:  Duration:   25 minutes  Indication(s): Encephalopathy  Technical Summary:  This EEG was performed utilizing standard International 10-20 System of electrode placement. One channel electrocardiogram was monitored. Data were obtained and interpreted utilizing referential montage recording, with reformatting to longitudinal, transverse bipolar, and referential montages as necessary for interpretation.  State(s):       Awake      Drowsy      Asleep  EKG: Single Lead EKG was Regular HR 90 BPM range  Activation Procedures: Hyperventilation: Not performed Photic Stimulation: Not performed   EEG Description:  The awake background is continuous and symmetric characterized by excess low-voltage fast activity which is diffuse with admixed theta delta slowing. Occasional fragments of a 6-7 Hz posterior dominant rhythm seen. on video monitoring. Several brief twitches of the arms and face seen which do not have any abnormal EEG correlate. No definite seizures, epileptiform discharges, or evolving ictal patterns seen. BIsynchronous sleep spindles seen during stage II sleep.  Impression:  Abnormal awake and asleep EEG due to:  1.excess low-voltage fast activity likely consistent with medication effect  2. Mild to moderate generalized slowing of the background consistent with a mild to moderate degree of diffuse or multifocal cerebral dysfunction  3.No definite seizures, epileptiform discharges, or evolving ictal patterns seen.  4. several brief twitches noted on EEG monitoring that did not have abnormal electrographic correlate do not appear to be ictal in nature    Dr Jossie Ng      [facility] For Inpatient follow-up  with [facility] physician please call [facility] [phone]. This is not an outpatient service. Post hospital discharge, please contact hospital directly.   Please call or reconsult our service if there are any clinical or diagnostic changes.

## 2022-09-15 NOTE — Progress Notes (Signed)
OT Cancellation Note  Patient Details Name: DEEPA MORPHIS MRN: [id number] DOB: 1935-12-08   Cancelled Treatment:    Reason Eval/Treat Not Completed: Medical issues which prohibited therapy (plan for HD later today, pt with poor mental status this AM. Will follow up tomorrow for OT evaluation as schedule permits)  Carver Fila, OTD, OTR/L [facility] Acute Rehab [phone]   Dalphine Handing 09/15/2022, 2:31 PM

## 2022-09-15 NOTE — Progress Notes (Signed)
OT Cancellation Note  Patient Details Name: Kim Burns MRN: [id number] DOB: [birth year]   Cancelled Treatment:    Reason Eval/Treat Not Completed: Medical issues which prohibited therapy (RN asking to hold at this time, per notes RR called. Will follow up for OT evaluation as appropriate)  Carver Fila, OTD, OTR/L [facility] Preferred Acute Rehab [phone]   Dalphine Handing 09/15/2022, 9:33 AM

## 2022-09-16 ENCOUNTER — Encounter (HOSPITAL_COMMUNITY): Payer: Self-pay | Admitting: Family Medicine

## 2022-09-16 DIAGNOSIS — I4891 Unspecified atrial fibrillation: Secondary | ICD-10-CM | POA: Diagnosis not present

## 2022-09-16 LAB — GLUCOSE, CAPILLARY
Glucose-Capillary: 143 mg/dL — ABNORMAL HIGH (ref 70–99)
Glucose-Capillary: 133 mg/dL — ABNORMAL HIGH (ref 70–99)
Glucose-Capillary: 121 mg/dL — ABNORMAL HIGH (ref 70–99)
Glucose-Capillary: 111 mg/dL — ABNORMAL HIGH (ref 70–99)

## 2022-09-16 LAB — HEMOGLOBIN A1C
Hgb A1c MFr Bld: 5.8 % — ABNORMAL HIGH (ref 4.8–5.6)
Mean Plasma Glucose: 120 mg/dL

## 2022-09-16 MED ORDER — DILTIAZEM LOAD VIA INFUSION
10.0000 mg | Freq: Once | INTRAVENOUS | Status: AC
  Administered 2022-09-16: 10 mg via INTRAVENOUS

## 2022-09-16 MED ORDER — DILTIAZEM HCL-DEXTROSE 125-5 MG/125ML-% IV SOLN (PREMIX)
5.0000 mg/h | INTRAVENOUS | Status: DC
  Administered 2022-09-16: 10 mg/h via INTRAVENOUS
  Administered 2022-09-16 (×2): 15 mg/h via INTRAVENOUS
  Administered 2022-09-17: 7.5 mg/h via INTRAVENOUS
  Administered 2022-09-17: 15 mg/h via INTRAVENOUS
  Administered 2022-09-18 – 2022-09-19 (×2): 7.5 mg/h via INTRAVENOUS
  Filled 2022-09-16 (×6): qty 125

## 2022-09-16 MED ORDER — DILTIAZEM HCL-DEXTROSE 125-5 MG/125ML-% IV SOLN (PREMIX)
INTRAVENOUS | Status: AC
  Filled 2022-09-16: qty 125

## 2022-09-16 NOTE — Plan of Care (Signed)
  Problem: Metabolic: Goal: Ability to maintain appropriate glucose levels will improve Outcome: Progressing   Problem: Skin Integrity: Goal: Risk for impaired skin integrity will decrease Outcome: Progressing   

## 2022-09-16 NOTE — Progress Notes (Signed)
[facility] Progress Note   Subjective:   Had HD overnight - no reported issues.  Sleepy this AM.  No family bedside.    Objective Vitals:   09/16/22 0246 09/16/22 0315 09/16/22 0333 09/16/22 0745  BP:  (!) 170/93  (!) 126/91  Pulse:  (!) 103 100 (!) 124  Resp:  (!) 28 (!) 22 20  Temp:  99 F (37.2 C)  99.8 F (37.7 C)  TempSrc:  Axillary  Axillary  SpO2:  97% 98% 98%  Weight: 57.6 kg 55.4 kg     Physical Exam General: sleepy, arousable to shoulder shake but falls back asleep Heart: a fib rates low 100s, no rub Lungs: clear ant Abdomen: soft Extremities: no edema Dialysis Access:  LUE AVF +t/b  Additional Objective Labs: Basic Metabolic Panel: Recent Labs  Lab 09/14/22 0506 09/15/22 0036 09/16/22 0606  NA 135 131* 136  K 3.7 4.2 3.8  CL 95* 94* 99  CO2 24 22 22   GLUCOSE 175* 123* 139*  BUN 38* 48* 25*  CREATININE 3.40* 4.03* 2.74*  CALCIUM 7.6* 7.5* 8.0*  PHOS  --  9.0* 6.2*   Liver Function Tests: Recent Labs  Lab 09/14/22 0506 09/15/22 0036 09/16/22 0606  AST 51* 27 23  ALT 90* 72* 54*  ALKPHOS 72 68 67  BILITOT 0.4 0.7 1.1  PROT 5.8* 5.7* 5.7*  ALBUMIN 2.7* 2.8* 2.6*   No results for input(s): "LIPASE", "AMYLASE" in the last 168 hours. CBC: Recent Labs  Lab 09/13/22 2342 09/14/22 0506 09/15/22 0036 09/16/22 0606  WBC 10.0 9.7 12.8* 13.2*  NEUTROABS  --  8.3* 11.1* 12.1*  HGB 10.8* 10.5* 10.2* 10.4*  HCT 34.9* 34.1* 32.3* 33.8*  MCV 92.8 92.7 91.8 93.1  PLT 226 233 236 230   Blood Culture    Component Value Date/Time   SDES BLOOD RIGHT ANTECUBITAL 01/22/2022 0038   SPECREQUEST  01/22/2022 0038    BOTTLES DRAWN AEROBIC AND ANAEROBIC Blood Culture results may not be optimal due to an inadequate volume of blood received in culture bottles   CULT  01/22/2022 0038    NO GROWTH 5 DAYS Performed at [facility], [address], [place], [place] [address]    REPTSTATUS 01/27/2022 FINAL 01/22/2022 0038    Cardiac  Enzymes: No results for input(s): "CKTOTAL", "CKMB", "CKMBINDEX", "TROPONINI" in the last 168 hours. CBG: Recent Labs  Lab 09/15/22 0910 09/15/22 1113 09/15/22 1612 09/15/22 2105 09/16/22 0602  GLUCAP 186* 178* 169* 160* 143*   Iron Studies: No results for input(s): "IRON", "TIBC", "TRANSFERRIN", "FERRITIN" in the last 72 hours. @lablastinr3 @ Studies/Results: ECHOCARDIOGRAM COMPLETE  Result Date: 09/15/2022    ECHOCARDIOGRAM REPORT   Patient Name:   Pheobe P Gingras Date of Exam: 09/15/2022 Medical Rec #:  [id number]        Height:       62.0 in Accession #:    [id number]       Weight:       116.4 lb Date of Birth:  17-Jul-1935        BSA:          1.519 m Patient Age:    87 years         BP:           158/89 mmHg Patient Gender: F                HR:           88 bpm. Exam Location:  Inpatient Procedure: 2D Echo, Cardiac Doppler and Color Doppler Indications:    Atrial fibrillation  History:        Patient has prior history of Echocardiogram examinations, most                 recent 08/27/2021. CHF, end stage renal disease; Risk                 Factors:Diabetes and Hypertension.  Sonographer:    [name] RDCS Referring Phys: [phone] A CALDWELL POWELL JR  Sonographer Comments: Tremors. IMPRESSIONS  1. Left ventricular ejection fraction, by estimation, is 55 to 60%. The left ventricle has normal function. The left ventricle has no regional wall motion abnormalities. Left ventricular diastolic parameters are indeterminate.  2. Right ventricular systolic function is mildly reduced. The right ventricular size is normal. There is severely elevated pulmonary artery systolic pressure.  3. Left atrial size was severely dilated.  4. Right atrial size was mild to moderately dilated.  5. The mitral valve is degenerative. Mild mitral valve regurgitation. Moderate mitral annular calcification.  6. Tricuspid valve regurgitation is moderate.  7. The aortic valve is tricuspid. Aortic valve regurgitation is not  visualized.  8. The inferior vena cava is normal in size with greater than 50% respiratory variability, suggesting right atrial pressure of 3 mmHg. FINDINGS  Left Ventricle: Left ventricular ejection fraction, by estimation, is 55 to 60%. The left ventricle has normal function. The left ventricle has no regional wall motion abnormalities. The left ventricular internal cavity size was normal in size. There is  no left ventricular hypertrophy. Left ventricular diastolic parameters are indeterminate. Right Ventricle: The right ventricular size is normal. Right ventricular systolic function is mildly reduced. There is severely elevated pulmonary artery systolic pressure. The tricuspid regurgitant velocity is 3.90 m/s, and with an assumed right atrial pressure of 3 mmHg, the estimated right ventricular systolic pressure is 63.8 mmHg. Left Atrium: Left atrial size was severely dilated. Right Atrium: Right atrial size was mild to moderately dilated. Pericardium: There is no evidence of pericardial effusion. Mitral Valve: The mitral valve is degenerative in appearance. Moderate mitral annular calcification. Mild mitral valve regurgitation. Tricuspid Valve: The tricuspid valve is normal in structure. Tricuspid valve regurgitation is moderate. Aortic Valve: The aortic valve is tricuspid. Aortic valve regurgitation is not visualized. Pulmonic Valve: The pulmonic valve was normal in structure. Pulmonic valve regurgitation is not visualized. Aorta: The aortic root and ascending aorta are structurally normal, with no evidence of dilitation. Venous: The inferior vena cava is normal in size with greater than 50% respiratory variability, suggesting right atrial pressure of 3 mmHg. IAS/Shunts: No atrial level shunt detected by color flow Doppler.  LEFT VENTRICLE PLAX 2D LVIDd:         4.40 cm   Diastology LVIDs:         3.30 cm   LV e' medial:  6.74 cm/s LV PW:         0.90 cm   LV e' lateral: 9.03 cm/s LV IVS:        1.00 cm LVOT  diam:     1.80 cm LV SV:         45 LV SV Index:   29 LVOT Area:     2.54 cm  RIGHT VENTRICLE            IVC RV Basal diam:  3.00 cm    IVC diam: 1.90 cm RV S prime:     8.81 cm/s  TAPSE (M-mode): 1.1 cm LEFT ATRIUM             Index        RIGHT ATRIUM           Index LA diam:        3.90 cm 2.57 cm/m   RA Area:     16.70 cm LA Vol (A2C):   95.7 ml 63.01 ml/m  RA Volume:   40.70 ml  26.80 ml/m LA Vol (A4C):   77.1 ml 50.76 ml/m LA Biplane Vol: 86.8 ml 57.15 ml/m  AORTIC VALVE LVOT Vmax:   85.10 cm/s LVOT Vmean:  57.500 cm/s LVOT VTI:    0.175 m  AORTA Ao Root diam: 3.00 cm Ao Asc diam:  2.90 cm TRICUSPID VALVE TR Peak grad:   60.8 mmHg TR Vmax:        390.00 cm/s  SHUNTS Systemic VTI:  0.18 m Systemic Diam: 1.80 cm Carolan Clines Electronically signed by Carolan Clines Signature Date/Time: 09/15/2022/1:17:18 PM    Final    MR BRAIN WO CONTRAST  Result Date: 09/14/2022 CLINICAL DATA:  Acute neurologic deficit EXAM: MRI HEAD WITHOUT CONTRAST TECHNIQUE: Multiplanar, multiecho pulse sequences of the brain and surrounding structures were obtained without intravenous contrast. COMPARISON:  None Available. FINDINGS: Brain: No acute infarct, mass effect or extra-axial collection. Unchanged focus of magnetic susceptibility effect within the right parietal white matter, possibly a cavernous malformation or remote hemorrhage. There is multifocal hyperintense T2-weighted signal within the white matter. Parenchymal volume and CSF spaces are normal. Old, small bilateral occipital and cerebellar infarcts. The midline structures are normal. Vascular: Major flow voids are preserved. Skull and upper cervical spine: Normal calvarium and skull base. Visualized upper cervical spine and soft tissues are normal. Sinuses/Orbits:No paranasal sinus fluid levels or advanced mucosal thickening. No mastoid or middle ear effusion. Normal orbits. IMPRESSION: 1. No acute intracranial abnormality. 2. Chronic small vessel ischemia and multiple  old small vessel infarcts. Electronically Signed   By: Deatra Robinson M.D.   On: 09/14/2022 21:28   Medications:  diltiazem (CARDIZEM) infusion 12.5 mg/hr (09/16/22 0807)    amiodarone  100 mg Oral Daily   brinzolamide  1 drop Left Eye BID   And   brimonidine  1 drop Left Eye BID   Chlorhexidine Gluconate Cloth  6 each Topical Q0600   Chlorhexidine Gluconate Cloth  6 each Topical Q0600   cinacalcet  30 mg Oral TID with meals   insulin aspart  0-6 Units Subcutaneous TID WC   lipase/protease/amylase  36,000 Units Oral TID WC   metoprolol tartrate  25 mg Oral BID   neomycin-polymyxin b-dexamethasone  1 drop Left Eye TID   prednisoLONE acetate  1 drop Left Eye QID   rOPINIRole  0.25 mg Oral QHS   sevelamer carbonate  1,600 mg Oral TID WC    OP HD: [facility] MWF  3.5h  350/ 1.5  55kg  2/2 bath  AVF   Heparin none - last OP HD 7/26, post wt 57.5kg   - coming up 2-3kg over dry wt for last 3 wks - hectorol 5 mcg IV three times per week - mircera 75 mcg IV q 2 wks, last 7/17, due 7/31     Assessment/ Plan: Afib w/ RVR - per pmd/ cardiology currently getting IV dilt gtt and po amio (was on at home). Metoprolol added, coreg on hold. Not on a/c. Per pmd.  AMS - likely due to excessive dosing of the valacyclovir at  1 gm tid; the adjustment for esrd patient would be 500 mg daily. Currently on hold.   ESRD - on HD MWF, last HD Friday. Had HD overnight 7/28-29 in part to dialyze acyclovir.  Labs and volume look good this AM but will do HD again later today to resume home schedule and provide additional clearance of acyclovir.  HTN/ volume - euvolemic on exam, 2kg under dry wt, BP's normal. No UF with HD today.  Anemia esrd - Hb 10-11 here, next esa due on 7/31. Follow.  MBD ckd - CCa in range, phos is high. Cont binder, renal diet and HD for high phos. Cont IV vdra, sensipar 30 tid.  DM2 - on insulin  Estill Bakes MD 09/16/2022, 10:09 AM  [facility] Pager: [phone]

## 2022-09-16 NOTE — Progress Notes (Signed)
OT Cancellation Note  Patient Details Name: Kim Burns MRN: [id number] DOB: 04/25/1935   Cancelled Treatment:    Reason Eval/Treat Not Completed: Fatigue/lethargy limiting ability to participate (per RN, pt still with minimal command follow. Will hold OT evaluation at this time, will follow up as appropriate.)  Carver Fila, OTD, OTR/L [facility] Preferred Acute Rehab [phone]   Carver Fila Koonce 09/16/2022, 9:15 AM

## 2022-09-16 NOTE — Progress Notes (Signed)
PT Cancellation Note  Patient Details Name: Kim Burns MRN: [id number] DOB: 05/15/1935   Cancelled Treatment:    Reason Eval/Treat Not Completed: Medical issues which prohibited therapy. Pt remains very lethargic and unable to participate in therapy at this time.  Lewis Shock, PT, DPT Acute Rehabilitation Services Secure chat preferred Office #: [phone]    Iona Hansen 09/16/2022, 9:14 AM

## 2022-09-16 NOTE — Progress Notes (Signed)
Dialysis came onto unit to inform me that patient's scheduled treatment will be delayed due to an emergent situation. Will be here later to dialyze.

## 2022-09-16 NOTE — TOC Initial Note (Signed)
Transition of Care [facility]) - Initial/Assessment Note    Patient Details  Name: Kim Burns MRN: [id number] Date of Birth: 12-28-1935  Transition of Care [facility]) CM/SW Contact:    Kim Masson, RN Phone Number: 09/16/2022, 2:18 PM  Clinical Narrative:                 Spoke to patient's daughter, Kim Burns, regarding transition needs. Patient lives alone but daughter or son is always with her.  Patient has a walker and has used [facility] in the past. Kim Burns is agreeable to use [facility] again if recommended.  Daughter or son transport patient to apts and dialysis.  Address, Phone number and PCP verified. TOC following.  Expected Discharge Plan: Home/Self Care Barriers to Discharge: Continued Medical Work up   Patient Goals and CMS Choice Patient states their goals for this hospitalization and ongoing recovery are:: return home          Expected Discharge Plan and Services       Living arrangements for the past 2 months: Single Family Home                                      Prior Living Arrangements/Services Living arrangements for the past 2 months: Single Family Home Lives with:: Self (either son or daughter stay with patient 24/7) Patient language and need for interpreter reviewed:: Yes Do you feel safe going back to the place where you live?: Yes      Need for Family Participation in Patient Care: Yes (Comment) Care giver support system in place?: Yes (comment)   Criminal Activity/Legal Involvement Pertinent to Current Situation/Hospitalization: No - Comment as needed  Activities of Daily Living      Permission Sought/Granted                  Emotional Assessment Appearance:: Appears stated age       Alcohol / Substance Use: Not Applicable Psych Involvement: No (comment)  Admission diagnosis:  Atrial fibrillation with RVR (HCC) [I48.91] Patient Active Problem List   Diagnosis Date Noted   Atrial fibrillation with RVR (HCC) 09/14/2022    Essential hypertension 09/14/2022   Erythropoietin (EPO) stimulating agent anemia management patient 07/16/2022   Hematoma of left kidney 01/22/2022   Kidney, perinephric abscess 11/02/2021   Bacteremia due to Streptococcus 11/01/2021   Lactic acidosis 10/30/2021   Fever 10/30/2021   Severe sepsis (HCC) 10/29/2021   Hydronephrosis of left kidney 10/29/2021   Chronic diastolic CHF (congestive heart failure) (HCC) 10/29/2021   Coronary artery disease involving native coronary artery of native heart without angina pectoris 10/29/2021   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) 10/29/2021   Chest pain of uncertain etiology    Pulmonary nodule 08/26/2021   Paroxysmal atrial fibrillation (HCC) 08/26/2021   End-stage renal disease on hemodialysis (HCC) 08/25/2021   Renal cell carcinoma of left kidney (HCC) 08/22/2021   DM2 (diabetes mellitus, type 2) (HCC) 11/14/2016   Hypokalemia 11/12/2016   Melena    History of anemia due to chronic kidney disease    Acute renal failure (ARF) (HCC) 10/03/2016   GERD (gastroesophageal reflux disease) 04/29/2016   Hyperglycemia 04/29/2016   IPMN (intraductal papillary mucinous neoplasm) 04/23/2016   History of stroke 04/16/2016   Sepsis (HCC) 06/18/2013   Localized swelling, mass and lump, neck 03/29/2013   Sinus bradycardia 09/18/2012   Fatigue 09/18/2012   PCP:  Duanne Guess,  Christell Constant, MD Pharmacy:   [facility] [address] [place] - [address] [place] [address] Phone: [phone] Fax: [phone]  [facility]) - [place], [place] - [address] [facility] [address] [place] [address] Phone: [phone] Fax: [phone]  [facility] [address] [place] [address] Phone: [phone] Fax: [phone]     Social Determinants of Health (SDOH) Social History: SDOH Screenings   Food Insecurity:  No Food Insecurity (11/01/2021)  Housing: Low Risk  (11/01/2021)  Transportation Needs: No Transportation Needs (11/01/2021)  Utilities: Not At Risk (11/01/2021)  Tobacco Use: Low Risk  (09/14/2022)   SDOH Interventions:     Readmission Risk Interventions    08/27/2021   10:16 AM  Readmission Risk Prevention Plan  Transportation Screening Complete  PCP or Specialist Appt within 3-5 Days Complete  HRI or Home Care Consult Complete  Social Work Consult for Recovery Care Planning/Counseling Complete  Palliative Care Screening Not Applicable  Medication Review Oceanographer) Complete

## 2022-09-16 NOTE — Consult Note (Cosign Needed)
Cardiology Consultation   Patient ID: [name] MRN: [id number]; DOB: 1935-09-10  Admit date: 09/13/2022 Date of Consult: 09/16/2022  PCP:  Lewis Moccasin, MD   [facility] Providers Cardiologist:  Peter Swaziland, MD        Patient Profile:   Kim Burns is a 87 y.o. female with a hx of atrial fib and orthostatic hypotension, CKD, HTN, ESRD on HD  MWF, DM, CVA 2018,  Whipple procedure in 2014 with pancreatectomy and partial bowel resection, renal carcinoma with ablation 08/2021 who is being seen 09/16/2022 for the evaluation of PAF at the request of Dr. Lowell Guitar.  History of Present Illness:   [name] with above hs with a fib dating to 08/2021 after an ablation procedure for Lt renal cell carcinoma.  Placed on rate control meds and eliquis and spontaneously converted to SR.  She had elevated troponins and and had cardiac cath 08/2021 with non obstructive disease 20% proximal RCA, 20% left circumflex artery, 20% mid left main, 30% ostial LAD, 50% proximal LAD, EF 55 to 65%.  Echo 08/2021 with LVEF of 60 to 65%, moderately reduced RV systolic function, grade 3 diastolic dysfunction, RSVP 46.1 mmHg, severe biatrial enlargement, mild MR, mild to moderate TR   She has had sepsis due to hydronephrosis and obstruction 10/2021, treated. 01/2022 with Anemia with subscapsular renal hematoma and eliquis was stopped.  Anemia treated- she with periodic iron transfusions and is on Arnaesp. Marland Kitchen  She was seen by Dr. Swaziland 08/20/22 and no awareness of A fib.  Still with orthostatic hypotension and takes midodrine on dialysis days.  She is very fatigued after dialysis.   She had been seen by Dr. Duanne Guess for elevated BNP with SOB and placed on lasix but no change in her small amount of urine outpt.  Her CXR was clear then.  Dr. Swaziland stopped hydralazine to allow more liberal BP.  And stopped lasix.  Difficulty managing fluid volume with hypotension with dialysis.      On prior notes on  anticoagulation ( on notes from discharge 01/31/23 eliquis was to be held and then resumed in 2 weeks if Hgb stable.)  Per Dr. Elvis Coil note "Given recent major bleeding episode we discussed options around stroke risk reduction. She does have a high [place] Vasc score of 7. She also has a high bleeding risk score HAS bled 5. One option would be to resume Eliquis providing repeat CT shows resolution of the hematoma. Another option would be to forego anticoagulation and continue to try and maintain NSR with amiodarone. The third option would be to consider her for a Watchman LA occlusion device. We spent time covering benefits and risks of each approach. At this point she is in favor of staying off Eliquis, trying to maintain NSR with amiodarone and monitor. She will think about the Watchman device and if she wants to pursue this will refer to our team that does this."  The eventual plan was to forego anticoagulation and try to maintain SR.    Pt presented to ER 09/13/22 with weakness and fatigue, she was found to be in a fib with RVR and HR 150-170s.   She had maybe one day of palpitations.  In ER palced on dilt drip.  Rate control was improved.  She continues on amiodarone and BB (coreg held and lopressor started) now with dilt drip    Echo this admit with EF 55-60% no RWMA   RV function is mildly reduced  RV size is normal and severely elevated PA systolic pressure ,  LA severely dilated (noted before) mild MR mild TR    EKG:  The EKG was personally reviewed and demonstrates:  atrial fib RVR at 170 no acute ST changes  follow up 09/14/22 with SB with prolonged Qtc though is on amiodarone.  Todays EKG a fib with RVR and no acute ST changes.   Telemetry:  Telemetry was personally reviewed and demonstrates:  in and out atrial fib.   Na 136 K+ 3.8 BUN 25 Cr 2.74 Ca+ 8.0 Mg+ 1.8 AST 23 ALT 54  WBC 13.2 Hgb 10.4  plts 230   TSH 2.840  Mg+ 2.2  A1C 5.8 BNP 1469  BP 127/90  P 118 R 20 low grade temp 99 Axillary    dilt drip at 15    Past Medical History:  Diagnosis Date   Anemia of chronic disease    takes iron   Anxiety    Blood transfusion without reported diagnosis    Bradycardia    Diabetes mellitus without complication (HCC)    became diabetic after Whipple procedure   Diarrhea    Dysrhythmia 04/16/2016   bradycardia due to medication    ESRD on hemodialysis [facility])    M-W-F   GERD (gastroesophageal reflux disease)    Gout    Headache    History of kidney stones 06/2013   Hyperparathyroidism (HCC)    Hypertension    PONV (postoperative nausea and vomiting)    Sleep apnea    no cpap machine. could not tolerate   Stroke (HCC) 04/16/2016   TIA 1995   Vitamin D deficiency     Past Surgical History:  Procedure Laterality Date   A/V FISTULAGRAM Left 08/30/2021   Procedure: A/V Fistulagram;  Surgeon: Cephus Shelling, MD;  Location: [facility] INVASIVE CV LAB;  Service: Cardiovascular;  Laterality: Left;   ABDOMINAL HYSTERECTOMY  1985   complete   BACK SURGERY  1980   lower   BASCILIC VEIN TRANSPOSITION Left 04/24/2017   Procedure: LEFT ARM FIRST STAGE BASILIC VEIN TRANSPOSITION;  Surgeon: Nada Libman, MD;  Location: [facility] OR;  Service: Vascular;  Laterality: Left;   BASCILIC VEIN TRANSPOSITION Left 07/10/2017   Procedure: SECOND STAGE BASILIC VEIN TRANSPOSITION LEFT ARM;  Surgeon: Nada Libman, MD;  Location: [facility] OR;  Service: Vascular;  Laterality: Left;   BREAST LUMPECTOMY Left x 2   many years apart, benign   CHOLECYSTECTOMY  1985   CYSTOSCOPY W/ URETERAL STENT PLACEMENT Left 10/31/2021   Procedure: CYSTOSCOPY WITH RETROGRADE PYELOGRAM/URETERAL STENT PLACEMENT;  Surgeon: Crist Fat, MD;  Location[facility] OR;  Service: Urology;  Laterality: Left;   CYSTOSCOPY WITH URETEROSCOPY AND STENT PLACEMENT Left 06/18/2013   Procedure: CYSTOSCOPY WITH Lef URETEROSCOPY AND Left STENT PLACEMENT;  Surgeon: Crecencio Mc, MD;  Location: [facility] ORS;  Service: Urology;  Laterality: Left;    ESOPHAGOGASTRODUODENOSCOPY (EGD) WITH PROPOFOL N/A 11/13/2016   Procedure: ESOPHAGOGASTRODUODENOSCOPY (EGD) WITH PROPOFOL;  Surgeon: Kathi Der, MD;  Location: [facility] ENDOSCOPY;  Service: Gastroenterology;  Laterality: N/A;   EUS N/A 02/07/2016   Procedure: ESOPHAGEAL ENDOSCOPIC ULTRASOUND (EUS) RADIAL;  Surgeon: Willis Modena, MD;  Location: [facility] ENDOSCOPY;  Service: Endoscopy;  Laterality: N/A;   EYE SURGERY Bilateral 2014   ioc for catracts    FOOT SURGERY Left 1990   something with toes unsure what    HERNIA REPAIR  2018   IR CATHETER TUBE CHANGE  03/14/2022   IR EMBO TUMOR  ORGAN ISCHEMIA INFARCT INC GUIDE ROADMAPPING  08/22/2021   IR NEPHROSTOMY EXCHANGE LEFT  06/27/2022   IR NEPHROSTOMY EXCHANGE LEFT  08/26/2022   IR NEPHROSTOMY PLACEMENT LEFT  03/14/2022   IR RADIOLOGIST EVAL & MGMT  05/18/2021   IR RADIOLOGIST EVAL & MGMT  10/01/2021   IR RADIOLOGIST EVAL & MGMT  11/12/2021   IR RADIOLOGIST EVAL & MGMT  11/22/2021   IR RADIOLOGIST EVAL & MGMT  11/27/2021   IR RADIOLOGIST EVAL & MGMT  12/20/2021   IR RENAL SUPRASEL UNI S&I MOD SED  08/22/2021   IR US GUIDE VASC ACCESS RIGHT  08/22/2021   LEFT HEART CATH AND CORONARY ANGIOGRAPHY N/A 08/27/2021   Procedure: LEFT HEART CATH AND CORONARY ANGIOGRAPHY;  Surgeon: Kathleene Hazel, MD;  Location: [facility] INVASIVE CV LAB;  Service: Cardiovascular;  Laterality: N/A;   PARATHYROID EXPLORATION     PERIPHERAL VASCULAR BALLOON ANGIOPLASTY Left 08/30/2021   Procedure: PERIPHERAL VASCULAR BALLOON ANGIOPLASTY;  Surgeon: Cephus Shelling, MD;  Location: [facility] INVASIVE CV LAB;  Service: Cardiovascular;  Laterality: Left;  arm fistula   RADIOLOGY WITH ANESTHESIA Left 08/22/2021   Procedure: MICROWAVE ABLATION;  Surgeon: Simonne Come, MD;  Location: [facility] ORS;  Service: Anesthesiology;  Laterality: Left;   WHIPPLE PROCEDURE N/A 04/23/2016   Procedure: WHIPPLE PROCEDURE;  Surgeon: Almond Lint, MD;  Location: [facility] OR;  Service: General;  Laterality: N/A;     Home  Medications:  Prior to Admission medications   Medication Sig Start Date End Date Taking? Authorizing Provider  acetaminophen (TYLENOL) 325 MG tablet Take 2 tablets (650 mg total) by mouth every 6 (six) hours as needed for mild pain, fever or headache. Patient taking differently: Take 500 mg by mouth every 6 (six) hours as needed for mild pain, fever or headache. 01/30/22  Yes Elgergawy, Leana Roe, MD  amiodarone (PACERONE) 200 MG tablet Take 0.5 tablets (100 mg total) by mouth daily. Please keep appointment for further refills 11/23/21  Yes Swazil[place], Peter M, MD  B Complex-C-Zn-Folic Acid (DIALYVITE 800 WITH ZINC) 0.8 MG TABS Take 1 tablet by mouth daily.   Yes [provider]  Brinzolamide-Brimonidine 1-0.2 % SUSP Apply 1 drop to eye 2 (two) times daily.   Yes [provider]  carvedilol (COREG) 3.125 MG tablet TAKE 1 TABLET BY MOUTH TWICE DAILY WITH A MEAL .  TAKE  ON  THE  DAYS  YOU  DO  NOT  HAVE  DIALYSIS. Patient taking differently: Take 3.125 mg by mouth See admin instructions. TAKE 1 TABLET BY MOUTH TWICE DAILY WITH A MEAL . TAKE ON THE DAYS YOU DO NOT HAVE DIALYSIS. 08/12/22  Yes [place], Peter M, MD  Cholecalciferol (VITAMIN D-3 PO) Take 1 capsule by mouth every Monday, Wednesday, and Friday with hemodialysis.   Yes [provider]  colestipol (COLESTID) 1 g tablet Take 1 g by mouth See admin instructions. 1 tablet (1g) prior to dialysis M-W-F + 1 tablet daily PRN diarrhea   Yes [provider]  diclofenac sodium (VOLTAREN) 1 % GEL Apply 2 g topically 4 (four) times daily as needed (for hand pain).   Yes [provider]  folic acid (FOLVITE) 1 MG tablet Take 1 tablet (1 mg total) by mouth daily. 01/31/22  Yes Elgergawy, Leana Roe, MD  furosemide (LASIX) 20 MG tablet Take 40 mg by mouth 3 (three) times daily. 09/04/22  Yes [provider]  insulin aspart (NOVOLOG) 100 UNIT/ML injection Inject 0-6 Units into the skin 3 (three)  times daily with  meals. Patient taking differently: Inject 0-6 Units into the skin See admin instructions. Only if sugar is >200 for at least an hour 01/30/22  Yes Elgergawy, Leana Roe, MD  lidocaine-prilocaine (EMLA) cream Apply 1 Application topically every Monday, Wednesday, and Friday with hemodialysis.   Yes [provider]  lipase/protease/amylase (CREON) 36000 UNITS CPEP capsule Take 36,000 Units by mouth 2 (two) times daily with a meal.   Yes [provider]  midodrine (PROAMATINE) 10 MG tablet Take 10 mg on dialysis days Mon-Wed-Fri 06/13/22  Yes [place]il[place], Peter M, MD  neomycin-polymyxin b-dexamethasone (MAXITROL) 3.5-10000-0.1 SUSP Place 1 drop into the left eye 3 (three) times daily. 09/12/22  Yes [provider]  olopatadine (PATADAY) 0.1 % ophthalmic solution Place 1 drop into both eyes 2 (two) times daily.   Yes [provider]  prednisoLONE acetate (PRED FORTE) 1 % ophthalmic suspension Place 1 drop into the left eye 4 (four) times daily. 09/13/22  Yes [provider]  RESTASIS 0.05 % ophthalmic emulsion Place 1 drop into both eyes as needed (yes irratation). 11/06/21  Yes [provider]  rOPINIRole (REQUIP) 0.25 MG tablet Take 0.5 mg by mouth at bedtime. 12/11/21  Yes [provider]  SENSIPAR 30 MG tablet Take 30 mg by mouth with breakfast, with lunch, and with evening meal. 12/18/12  Yes [provider]  sevelamer carbonate (RENVELA) 800 MG tablet Take 1,600 mg by mouth 3 (three) times daily with meals.   Yes [provider]  valACYclovir (VALTREX) 1000 MG tablet Take 1,000 mg by mouth 3 (three) times daily. 09/12/22  Yes [provider]  ciprofloxacin (CIPRO) 500 MG tablet Take 500 mg by mouth 2 (two) times daily. Patient not taking: Reported on 09/14/2022 08/28/22   [provider]    Inpatient Medications: Scheduled Meds:  amiodarone  100 mg Oral Daily   brinzolamide  1 drop Left Eye BID   And    brimonidine  1 drop Left Eye BID   Chlorhexidine Gluconate Cloth  6 each Topical Q0600   Chlorhexidine Gluconate Cloth  6 each Topical Q0600   cinacalcet  30 mg Oral TID with meals   insulin aspart  0-6 Units Subcutaneous TID WC   lipase/protease/amylase  36,000 Units Oral TID WC   metoprolol tartrate  25 mg Oral BID   neomycin-polymyxin b-dexamethasone  1 drop Left Eye TID   prednisoLONE acetate  1 drop Left Eye QID   rOPINIRole  0.25 mg Oral QHS   sevelamer carbonate  1,600 mg Oral TID WC   Continuous Infusions:  diltiazem (CARDIZEM) infusion 15 mg/hr (09/16/22 1407)   PRN Meds: acetaminophen **OR** acetaminophen, diazepam, melatonin, ondansetron (ZOFRAN) IV  Allergies:    Allergies  Allergen Reactions   Lopid [Gemfibrozil] Other (See Comments)    Myalgia    Lotemax [Loteprednol Etabonate] Rash    Skin burning  Blurry vision   Statins Other (See Comments)    Rosuvastatin Muscle weakness   Norco [Hydrocodone-Acetaminophen] Other (See Comments)    Headaches  Tolerates acetaminophen    Other Diarrhea    Real Butter - diarrhea   Tomato Other (See Comments)    Belching    Vibra-Tab [Doxycycline] Other (See Comments)    Unknown reaction   Amlodipine Other (See Comments)    Norvasc   Codeine Other (See Comments)    headache   Hydrocodone Other (See Comments)   Hydrocodone-Acetaminophen Other (See Comments)    Headache. Tolerates acetaminophen.  Lisinopril Other (See Comments)   Verapamil Other (See Comments)    Social History:   Social History   Socioeconomic History   Marital status: Widowed    Spouse name: Not on file   Number of children: Not on file   Years of education: Not on file   Highest education level: Not on file  Occupational History   Not on file  Tobacco Use   Smoking status: Never   Smokeless tobacco: Never  Vaping Use   Vaping status: Never Used  Substance and Sexual Activity   Alcohol use: No   Drug use: No   Sexual activity: Not on  file  Other Topics Concern   Not on file  Social History Narrative   Not on file   Social Determinants of Health   Financial Resource Strain: Not on file  Food Insecurity: No Food Insecurity (11/01/2021)   Hunger Vital Sign    Worried About Running Out of Food in the Last Year: Never true    Ran Out of Food in the Last Year: Never true  Transportation Needs: No Transportation Needs (11/01/2021)   PRAPARE - Administrator, Civil Service (Medical): No    Lack of Transportation (Non-Medical): No  Physical Activity: Not on file  Stress: Not on file  Social Connections: Not on file  Intimate Partner Violence: Not At Risk (11/01/2021)   Humiliation, Afraid, Rape, and Kick questionnaire    Fear of Current or Ex-Partner: No    Emotionally Abused: No    Physically Abused: No    Sexually Abused: No    Family History:    Family History  Problem Relation Age of Onset   Heart disease Mother    Stroke Mother    Cancer Father        colon   Alzheimer's disease Sister    Alzheimer's disease Sister    Cancer Brother        brain   Breast cancer Neg Hx      ROS:  Please see the history of present illness.  General:no colds or fevers, no weight changes Skin:no rashes or ulcers HEENT:no blurred vision, no congestion CV:see HPI PUL:see HPI GI:no diarrhea constipation or melena, no indigestion GU:no hematuria, no dysuria MS:no joint pain, no claudication Neuro:no syncope, no lightheadedness Endo:+ diabetes, no thyroid disease  All other ROS reviewed and negative.     Physical Exam/Data:   Vitals:   09/16/22 0315 09/16/22 0333 09/16/22 0745 09/16/22 1153  BP: (!) 170/93  (!) 126/91 120/76  Pulse: (!) 103 100 (!) 124 (!) 101  Resp: (!) 28 (!) 22 20 (!) 21  Temp: 99 F (37.2 C)  99.8 F (37.7 C) 99 F (37.2 C)  TempSrc: Axillary  Axillary Axillary  SpO2: 97% 98% 98% 94%  Weight: 55.4 kg       Intake/Output Summary (Last 24 hours) at 09/16/2022 1459 Last data  filed at 09/16/2022 0317 Gross per 24 hour  Intake 100 ml  Output 650 ml  Net -550 ml      09/16/2022    3:15 AM 09/16/2022    2:46 AM 09/15/2022    3:00 AM  Last 3 Weights  Weight (lbs) 122 lb 2.2 oz 126 lb 15.8 oz 116 lb 6.5 oz  Weight (kg) 55.4 kg 57.6 kg 52.8 kg     Body mass index is 22.34 kg/m.  General:  frail female, very tired, in no acute distress HEENT: normal Neck: + JVD Vascular:  No carotid bruits; Distal pulses 2+ bilaterally Cardiac:  normal S1, S2; RRR; no murmur no gallup or rub  Lungs:  clear to auscultation bilaterally, no wheezing, rhonchi or rales  Abd: soft, nontender, no hepatomegaly  Ext: no edema Musculoskeletal:  No deformities, BUE and BLE strength normal and equal Skin: warm and dry  Neuro:  alert though sleepy at times, obviously not feeling, no focal abnormalities noted Psych:  Normal affect   Relevant CV Studies: Echo 09/15/22 IMPRESSIONS     1. Left ventricular ejection fraction, by estimation, is 55 to 60%. The  left ventricle has normal function. The left ventricle has no regional  wall motion abnormalities. Left ventricular diastolic parameters are  indeterminate.   2. Right ventricular systolic function is mildly reduced. The right  ventricular size is normal. There is severely elevated pulmonary artery  systolic pressure.   3. Left atrial size was severely dilated.   4. Right atrial size was mild to moderately dilated.   5. The mitral valve is degenerative. Mild mitral valve regurgitation.  Moderate mitral annular calcification.   6. Tricuspid valve regurgitation is moderate.   7. The aortic valve is tricuspid. Aortic valve regurgitation is not  visualized.   8. The inferior vena cava is normal in size with greater than 50%  respiratory variability, suggesting right atrial pressure of 3 mmHg.   FINDINGS   Left Ventricle: Left ventricular ejection fraction, by estimation, is 55  to 60%. The left ventricle has normal function. The  left ventricle has no  regional wall motion abnormalities. The left ventricular internal cavity  size was normal in size. There is   no left ventricular hypertrophy. Left ventricular diastolic parameters  are indeterminate.   Right Ventricle: The right ventricular size is normal. Right ventricular  systolic function is mildly reduced. There is severely elevated pulmonary  artery systolic pressure. The tricuspid regurgitant velocity is 3.90 m/s,  and with an assumed right atrial  pressure of 3 mmHg, the estimated right ventricular systolic pressure is  63.8 mmHg.   Left Atrium: Left atrial size was severely dilated.   Right Atrium: Right atrial size was mild to moderately dilated.   Pericardium: There is no evidence of pericardial effusion.   Mitral Valve: The mitral valve is degenerative in appearance. Moderate  mitral annular calcification. Mild mitral valve regurgitation.   Tricuspid Valve: The tricuspid valve is normal in structure. Tricuspid  valve regurgitation is moderate.   Aortic Valve: The aortic valve is tricuspid. Aortic valve regurgitation is  not visualized.   Pulmonic Valve: The pulmonic valve was normal in structure. Pulmonic valve  regurgitation is not visualized.   Aorta: The aortic root and ascending aorta are structurally normal, with  no evidence of dilitation.   Venous: The inferior vena cava is normal in size with greater than 50%  respiratory variability, suggesting right atrial pressure of 3 mmHg.   IAS/Shunts: No atrial level shunt detected by color flow Doppler.       Laboratory Data:  High Sensitivity Troponin:  No results for input(s): "TROPONINIHS" in the last 720 hours.   Chemistry Recent Labs  Lab 09/14/22 0506 09/15/22 0036 09/16/22 0606  NA 135 131* 136  K 3.7 4.2 3.8  CL 95* 94* 99  CO2 24 22 22   GLUCOSE 175* 123* 139*  BUN 38* 48* 25*  CREATININE 3.40* 4.03* 2.74*  CALCIUM 7.6* 7.5* 8.0*  MG 2.2 2.2 1.8  GFRNONAA 13* 10*  16*  ANIONGAP 16* 15 15  Recent Labs  Lab 09/14/22 0506 09/15/22 0036 09/16/22 0606  PROT 5.8* 5.7* 5.7*  ALBUMIN 2.7* 2.8* 2.6*  AST 51* 27 23  ALT 90* 72* 54*  ALKPHOS 72 68 67  BILITOT 0.4 0.7 1.1   Lipids No results for input(s): "CHOL", "TRIG", "HDL", "LABVLDL", "LDLCALC", "CHOLHDL" in the last 168 hours.  Hematology Recent Labs  Lab 09/14/22 0506 09/15/22 0036 09/16/22 0606  WBC 9.7 12.8* 13.2*  RBC 3.68* 3.52* 3.63*  HGB 10.5* 10.2* 10.4*  HCT 34.1* 32.3* 33.8*  MCV 92.7 91.8 93.1  MCH 28.5 29.0 28.7  MCHC 30.8 31.6 30.8  RDW 18.0* 18.1* 17.5*  PLT 233 236 230   Thyroid  Recent Labs  Lab 09/14/22 0430  TSH 2.840    BNP Recent Labs  Lab 09/14/22 0512  BNP 1,469.0*    DDimer No results for input(s): "DDIMER" in the last 168 hours.   Radiology/Studies:  ECHOCARDIOGRAM COMPLETE  Result Date: 09/15/2022    ECHOCARDIOGRAM REPORT   Patient Name:   Lesleigh P Snellgrove Date of Exam: 09/15/2022 Medical Rec #:  [id number]        Height:       62.0 in Accession #:    [id number]       Weight:       116.4 lb Date of Birth:  08/13/1935        BSA:          1.519 m Patient Age:    87 years         BP:           158/89 mmHg Patient Gender: F                HR:           88 bpm. Exam Location:  Inpatient Procedure: 2D Echo, Cardiac Doppler and Color Doppler Indications:    Atrial fibrillation  History:        Patient has prior history of Echocardiogram examinations, most                 recent 08/27/2021. CHF, end stage renal disease; Risk                 Factors:Diabetes and Hypertension.  Sonographer:    [name] RDCS Referring Phys: [phone] A CALDWELL POWELL JR  Sonographer Comments: Tremors. IMPRESSIONS  1. Left ventricular ejection fraction, by estimation, is 55 to 60%. The left ventricle has normal function. The left ventricle has no regional wall motion abnormalities. Left ventricular diastolic parameters are indeterminate.  2. Right ventricular systolic function is  mildly reduced. The right ventricular size is normal. There is severely elevated pulmonary artery systolic pressure.  3. Left atrial size was severely dilated.  4. Right atrial size was mild to moderately dilated.  5. The mitral valve is degenerative. Mild mitral valve regurgitation. Moderate mitral annular calcification.  6. Tricuspid valve regurgitation is moderate.  7. The aortic valve is tricuspid. Aortic valve regurgitation is not visualized.  8. The inferior vena cava is normal in size with greater than 50% respiratory variability, suggesting right atrial pressure of 3 mmHg. FINDINGS  Left Ventricle: Left ventricular ejection fraction, by estimation, is 55 to 60%. The left ventricle has normal function. The left ventricle has no regional wall motion abnormalities. The left ventricular internal cavity size was normal in size. There is  no left ventricular hypertrophy. Left ventricular diastolic parameters are indeterminate. Right Ventricle: The right ventricular size is normal. Right  ventricular systolic function is mildly reduced. There is severely elevated pulmonary artery systolic pressure. The tricuspid regurgitant velocity is 3.90 m/s, and with an assumed right atrial pressure of 3 mmHg, the estimated right ventricular systolic pressure is 63.8 mmHg. Left Atrium: Left atrial size was severely dilated. Right Atrium: Right atrial size was mild to moderately dilated. Pericardium: There is no evidence of pericardial effusion. Mitral Valve: The mitral valve is degenerative in appearance. Moderate mitral annular calcification. Mild mitral valve regurgitation. Tricuspid Valve: The tricuspid valve is normal in structure. Tricuspid valve regurgitation is moderate. Aortic Valve: The aortic valve is tricuspid. Aortic valve regurgitation is not visualized. Pulmonic Valve: The pulmonic valve was normal in structure. Pulmonic valve regurgitation is not visualized. Aorta: The aortic root and ascending aorta are  structurally normal, with no evidence of dilitation. Venous: The inferior vena cava is normal in size with greater than 50% respiratory variability, suggesting right atrial pressure of 3 mmHg. IAS/Shunts: No atrial level shunt detected by color flow Doppler.  LEFT VENTRICLE PLAX 2D LVIDd:         4.40 cm   Diastology LVIDs:         3.30 cm   LV e' medial:  6.74 cm/s LV PW:         0.90 cm   LV e' lateral: 9.03 cm/s LV IVS:        1.00 cm LVOT diam:     1.80 cm LV SV:         45 LV SV Index:   29 LVOT Area:     2.54 cm  RIGHT VENTRICLE            IVC RV Basal diam:  3.00 cm    IVC diam: 1.90 cm RV S prime:     8.81 cm/s TAPSE (M-mode): 1.1 cm LEFT ATRIUM             Index        RIGHT ATRIUM           Index LA diam:        3.90 cm 2.57 cm/m   RA Area:     16.70 cm LA Vol (A2C):   95.7 ml 63.01 ml/m  RA Volume:   40.70 ml  26.80 ml/m LA Vol (A4C):   77.1 ml 50.76 ml/m LA Biplane Vol: 86.8 ml 57.15 ml/m  AORTIC VALVE LVOT Vmax:   85.10 cm/s LVOT Vmean:  57.500 cm/s LVOT VTI:    0.175 m  AORTA Ao Root diam: 3.00 cm Ao Asc diam:  2.90 cm TRICUSPID VALVE TR Peak grad:   60.8 mmHg TR Vmax:        390.00 cm/s  SHUNTS Systemic VTI:  0.18 m Systemic Diam: 1.80 cm Carolan Clines Electronically signed by Carolan Clines Signature Date/Time: 09/15/2022/1:17:18 PM    Final    MR BRAIN WO CONTRAST  Result Date: 09/14/2022 CLINICAL DATA:  Acute neurologic deficit EXAM: MRI HEAD WITHOUT CONTRAST TECHNIQUE: Multiplanar, multiecho pulse sequences of the brain and surrounding structures were obtained without intravenous contrast. COMPARISON:  None Available. FINDINGS: Brain: No acute infarct, mass effect or extra-axial collection. Unchanged focus of magnetic susceptibility effect within the right parietal white matter, possibly a cavernous malformation or remote hemorrhage. There is multifocal hyperintense T2-weighted signal within the white matter. Parenchymal volume and CSF spaces are normal. Old, small bilateral occipital and  cerebellar infarcts. The midline structures are normal. Vascular: Major flow voids are preserved. Skull and upper cervical spine:  Normal calvarium and skull base. Visualized upper cervical spine and soft tissues are normal. Sinuses/Orbits:No paranasal sinus fluid levels or advanced mucosal thickening. No mastoid or middle ear effusion. Normal orbits. IMPRESSION: 1. No acute intracranial abnormality. 2. Chronic small vessel ischemia and multiple old small vessel infarcts. Electronically Signed   By: Deatra Robinson M.D.   On: 09/14/2022 21:28   DG Chest Portable 1 View  Result Date: 09/14/2022 CLINICAL DATA:  Palpitations EXAM: PORTABLE CHEST 1 VIEW COMPARISON:  Radiographs 08/09/2022 FINDINGS: Stable cardiomegaly. Aortic atherosclerotic calcification. Chronic bronchitic changes. Atelectasis or infiltrates in the right mid to upper lung. Right basilar atelectasis. No pleural effusion or pneumothorax. No displaced rib fractures. IMPRESSION: Atelectasis or infiltrates in the right mid/upper lung. Electronically Signed   By: Minerva Fester M.D.   On: 09/14/2022 00:20     Assessment and Plan:   Atrial fib with RVR episodic on dilt drip po BB and po amiodarone -- with recurrent a fib first episode since initial episode would add IV amiodarone to build up level but will need to be anticoagulated.  Her Cha2DS2VASc is 7 and she has hx of stroke.   I would anticoagulate now and follow Hgb and in future consider Watchman device if pt agrees. Discussed with pt and daughter,  she bleeds easily with dialysis sticks.  We discussed risk of bleeding with anticoagulation and risk of stroke.  Will plan to hold anticoagulation and if BP drops or HR stays more elevated then begin IV amiodarone.  For now continue dilt drip.   Non obstructive CAD by cath in 2023 no chest pain  Acute metabolic encephalopathy  per IM ESRD on HD per Renal Elevated pulmonary systolic pressure  Herpes ophthalmicus per IM DM/HTN/with hx or  orthostatic hypotension per IM     Risk Assessment/Risk Scores:          CHA2DS2-VASc Score = 7   This indicates a 11.2% annual risk of stroke. The patient's score is based upon: CHF History: 0 HTN History: 1 Diabetes History: 1 Stroke History: 2 Vascular Disease History: 0 Age Score: 2 Gender Score: 1         For questions or updates, please contact [facility] Please consult [email or web address] for contact info under    Signed, Nada Boozer, NP  09/16/2022 2:59 PM  Patient seen and examined with Nada Boozer NP.  Agree as above, with the following exceptions and changes as noted below. Pt with complex history and appears frail and unwell, with metabolic encephalopathy undergoing in hospital dialysis, known ESRD, and cardiology consulted for atrial fibrillation. She is not on anticoagulation due to increased risk of bleeding as noted above, deteremined by her and her primary cardiologist. We discussed in detail the merits of anticoagulation now that she is currently in atrial fibrillation. With her daughter at the bedside, we again determined in shared decision making that the risk of bleeding seems to outweigh the benefit of stroke prevention. Gen: appears unwell, CV: irregular and tachycardic no murmurs, Lungs: clear, Abd: soft, Extrem: no edema, warm. Neuro/Psych: somewhat confused. All available labs, radiology testing, previous records reviewed. Agree as above that rates are reasonably controlled on diltiazem infusion at this time. Continue oral amiodarone which she takes at home. If rates prove hard to control with diltiazem alone, we agreed to consider IV amiodarone, despite no anticoagulation. Challenging situation.   Cardiology will follow as needed.  Parke Poisson, MD

## 2022-09-16 NOTE — Progress Notes (Signed)
Pt receives out-pt HD at [facility] on MWF. Will assist as needed.   Tracy Mounce Renal Navigator [phone] 

## 2022-09-16 NOTE — Progress Notes (Signed)
PROGRESS NOTE    Kim Burns  QVZ:[id number] DOB: 12-12-1935 DOA: 09/13/2022 PCP: Lewis Moccasin, MD  Chief Complaint  Patient presents with   Weakness   Fatigue   Tachycardia    Brief Narrative:   Kim Burns is Kim Burns 87 y.o. female with medical history significant for paroxysmal atrial fibrillation, end-stage renal disease on hemodialysis on Monday, Wednesday, Friday schedule, type 2 diabetes mellitus, essential hypertension, chronic diastolic heart failure, who is admitted to [facility] on 09/13/2022 with atrial fibrillation with RVR after presenting from home to [facility] ED complaining of palpitations.   Assessment & Plan:   Principal Problem:   Atrial fibrillation with RVR (HCC) Active Problems:   End-stage renal disease on hemodialysis (HCC)   DM2 (diabetes mellitus, type 2) (HCC)   Chronic diastolic CHF (congestive heart failure) (HCC)   History of anemia due to chronic kidney disease   Essential hypertension  Acute Metabolic Encephalopathy Myoclonus  Encephalopathic still this morning, but jerking/rigidity of upper extremities is improved (she received valium this morning around 5 - will d/c this and follow) MRI was negative for stroke Will follow EEG -> excess low voltage fast activity c/w medication effect, mild to moderate generalized slowing of background c/w mild to moderate degree of diffuse or multifocal cerebral dysfunction (see report) and UA (pending collection) Appreciate nephrology's input, valtrex inappropriately dosed for her renal dysfunction -> this can cause myoclonus, delirium, encephalopathy, seizures, etc --- suspect this is the most likely cause of her encephalopathy and myoclonic jerks  ---s/p dialysis last night Valtrex currently on hold Delirium precautions  Atrial Fibrillation with RVR Unclear precipitant  TSH wnl, CXR with atelectasis/infiltrates in R mid/upper lung Had converted on amiodarone without recurrence after ablation  procedure July 2023 - was on low dose amiodarone and coreg prior to admission Now on metoprolol and amiodarone - rates had been appropriate, but now back in afib with RVR (suspect at least partially related to not being able to take PO meds yesterday) -> back on dilt gtt today Coreg currently on hold (will try metop instead) Not on anticoagulation after discussion with cardiology outpatient Will consult cardiology for assistance   ESRD  Volume Overload  Bilateral Lower Extremity Edema Volume per renal with dialysis, appreciate assistance  Diastolic Heart Failure Moderately Reduced RVSF Moderately Elevated PASP Volume per renal  Echo 08/2021 with moderately reduced RVSF, moderately elevated PASP, grade III diastolic dysfunction   Prolonged Qtc Daily EKG Caution with qt prolonging meds  Herpes Opthalmicus? She was started on TID valtrex on Thursday for this Also getting prenisolone acetate ophthalmic suspension, neomycin-polymixin b- dexamethasone ophthalmic suspension, brinzolamide brimonidine ophthalmic suspension  Once she's better from neuro standpoint, will plan to resume valtrex with appropriate renal dosing   T2DM SSI  A1c pending  Hypertension On coreg, but uses midodrine on dialysis days Coreg currently on hold, follow BP on metop  Anemia of Chronic Kidney Disease Noted  Elevated LFT's Noted, will trend  RLS Requip     DVT prophylaxis: SCD Code Status: full Family Communication: daughter at bedside Disposition:   Status is: Observation The patient remains OBS appropriate and will d/c before 2 midnights.   Consultants:  renal  Procedures:  none  Antimicrobials:  Anti-infectives (From admission, onward)    Start     Dose/Rate Route Frequency Ordered Stop   09/14/22 2200  valACYclovir (VALTREX) tablet 1,000 mg  Status:  Discontinued        1,000 mg Oral 3 times  daily 09/14/22 1716 09/15/22 0956       Subjective: Lethargic again today, awakens  to voice Daughter not at bedside  Objective: Vitals:   09/16/22 0246 09/16/22 0315 09/16/22 0333 09/16/22 0745  BP:  (!) 170/93  (!) 126/91  Pulse:  (!) 103 100 (!) 124  Resp:  (!) 28 (!) 22 20  Temp:  99 F (37.2 C)  99.8 F (37.7 C)  TempSrc:  Axillary  Axillary  SpO2:  97% 98% 98%  Weight: 57.6 kg 55.4 kg      Intake/Output Summary (Last 24 hours) at 09/16/2022 0904 Last data filed at 09/16/2022 0317 Gross per 24 hour  Intake 100 ml  Output 650 ml  Net -550 ml   Filed Weights   09/15/22 0300 09/16/22 0246 09/16/22 0315  Weight: 52.8 kg 57.6 kg 55.4 kg    Examination:  General: No acute distress. Cardiovascular: tachy irregularly irregular Lungs: Clear to auscultation bilaterally  Abdomen: Soft, nontender, mild distension Neurological: no myoclonic jerks or rigidity noted today, but she's lethargic (received valium around 5 am), awakens to voice - PERRL  Skin: Warm and dry. No rashes or lesions. Extremities: No clubbing or cyanosis. No edema.  Data Reviewed: I have personally reviewed following labs and imaging studies  CBC: Recent Labs  Lab 09/13/22 2342 09/14/22 0506 09/15/22 0036 09/16/22 0606  WBC 10.0 9.7 12.8* 13.2*  NEUTROABS  --  8.3* 11.1* 12.1*  HGB 10.8* 10.5* 10.2* 10.4*  HCT 34.9* 34.1* 32.3* 33.8*  MCV 92.8 92.7 91.8 93.1  PLT 226 233 236 230    Basic Metabolic Panel: Recent Labs  Lab 09/13/22 2342 09/14/22 0506 09/15/22 0036 09/16/22 0606  NA 136 135 131* 136  K 3.6 3.7 4.2 3.8  CL 95* 95* 94* 99  CO2 26 24 22 22   GLUCOSE 167* 175* 123* 139*  BUN 34* 38* 48* 25*  CREATININE 3.30* 3.40* 4.03* 2.74*  CALCIUM 7.9* 7.6* 7.5* 8.0*  MG 2.4 2.2 2.2 1.8  PHOS  --   --  9.0* 6.2*    GFR: Estimated Creatinine Clearance: 11.9 mL/min (Keyna Blizard) (by C-G formula based on SCr of 2.74 mg/dL (H)).  Liver Function Tests: Recent Labs  Lab 09/14/22 0506 09/15/22 0036 09/16/22 0606  AST 51* 27 23  ALT 90* 72* 54*  ALKPHOS 72 68 67  BILITOT 0.4  0.7 1.1  PROT 5.8* 5.7* 5.7*  ALBUMIN 2.7* 2.8* 2.6*    CBG: Recent Labs  Lab 09/15/22 0910 09/15/22 1113 09/15/22 1612 09/15/22 2105 09/16/22 0602  GLUCAP 186* 178* 169* 160* 143*     No results found for this or any previous visit (from the past 240 hour(s)).       Radiology Studies: ECHOCARDIOGRAM COMPLETE  Result Date: 09/15/2022    ECHOCARDIOGRAM REPORT   Patient Name:   Kim Burns Date of Exam: 09/15/2022 Medical Rec #:  [id number]        Height:       62.0 in Accession #:    [id number]       Weight:       116.4 lb Date of Birth:  [birth year]        BSA:          1.519 m Patient Age:    87 years         BP:           158/89 mmHg Patient Gender: F  HR:           88 bpm. Exam Location:  Inpatient Procedure: 2D Echo, Cardiac Doppler and Color Doppler Indications:    Atrial fibrillation  History:        Patient has prior history of Echocardiogram examinations, most                 recent 08/27/2021. CHF, end stage renal disease; Risk                 Factors:Diabetes and Hypertension.  Sonographer:    [name] RDCS Referring Phys: [phone] Merek Niu CALDWELL POWELL JR  Sonographer Comments: Tremors. IMPRESSIONS  1. Left ventricular ejection fraction, by estimation, is 55 to 60%. The left ventricle has normal function. The left ventricle has no regional wall motion abnormalities. Left ventricular diastolic parameters are indeterminate.  2. Right ventricular systolic function is mildly reduced. The right ventricular size is normal. There is severely elevated pulmonary artery systolic pressure.  3. Left atrial size was severely dilated.  4. Right atrial size was mild to moderately dilated.  5. The mitral valve is degenerative. Mild mitral valve regurgitation. Moderate mitral annular calcification.  6. Tricuspid valve regurgitation is moderate.  7. The aortic valve is tricuspid. Aortic valve regurgitation is not visualized.  8. The inferior vena cava is normal in size with  greater than 50% respiratory variability, suggesting right atrial pressure of 3 mmHg. FINDINGS  Left Ventricle: Left ventricular ejection fraction, by estimation, is 55 to 60%. The left ventricle has normal function. The left ventricle has no regional wall motion abnormalities. The left ventricular internal cavity size was normal in size. There is  no left ventricular hypertrophy. Left ventricular diastolic parameters are indeterminate. Right Ventricle: The right ventricular size is normal. Right ventricular systolic function is mildly reduced. There is severely elevated pulmonary artery systolic pressure. The tricuspid regurgitant velocity is 3.90 m/s, and with an assumed right atrial pressure of 3 mmHg, the estimated right ventricular systolic pressure is 63.8 mmHg. Left Atrium: Left atrial size was severely dilated. Right Atrium: Right atrial size was mild to moderately dilated. Pericardium: There is no evidence of pericardial effusion. Mitral Valve: The mitral valve is degenerative in appearance. Moderate mitral annular calcification. Mild mitral valve regurgitation. Tricuspid Valve: The tricuspid valve is normal in structure. Tricuspid valve regurgitation is moderate. Aortic Valve: The aortic valve is tricuspid. Aortic valve regurgitation is not visualized. Pulmonic Valve: The pulmonic valve was normal in structure. Pulmonic valve regurgitation is not visualized. Aorta: The aortic root and ascending aorta are structurally normal, with no evidence of dilitation. Venous: The inferior vena cava is normal in size with greater than 50% respiratory variability, suggesting right atrial pressure of 3 mmHg. IAS/Shunts: No atrial level shunt detected by color flow Doppler.  LEFT VENTRICLE PLAX 2D LVIDd:         4.40 cm   Diastology LVIDs:         3.30 cm   LV e' medial:  6.74 cm/s LV PW:         0.90 cm   LV e' lateral: 9.03 cm/s LV IVS:        1.00 cm LVOT diam:     1.80 cm LV SV:         45 LV SV Index:   29 LVOT Area:      2.54 cm  RIGHT VENTRICLE            IVC RV Basal diam:  3.00 cm  IVC diam: 1.90 cm RV S prime:     8.81 cm/s TAPSE (M-mode): 1.1 cm LEFT ATRIUM             Index        RIGHT ATRIUM           Index LA diam:        3.90 cm 2.57 cm/m   RA Area:     16.70 cm LA Vol (A2C):   95.7 ml 63.01 ml/m  RA Volume:   40.70 ml  26.80 ml/m LA Vol (A4C):   77.1 ml 50.76 ml/m LA Biplane Vol: 86.8 ml 57.15 ml/m  AORTIC VALVE LVOT Vmax:   85.10 cm/s LVOT Vmean:  57.500 cm/s LVOT VTI:    0.175 m  AORTA Ao Root diam: 3.00 cm Ao Asc diam:  2.90 cm TRICUSPID VALVE TR Peak grad:   60.8 mmHg TR Vmax:        390.00 cm/s  SHUNTS Systemic VTI:  0.18 m Systemic Diam: 1.80 cm Carolan Clines Electronically signed by Carolan Clines Signature Date/Time: 09/15/2022/1:17:18 PM    Final    MR BRAIN WO CONTRAST  Result Date: 09/14/2022 CLINICAL DATA:  Acute neurologic deficit EXAM: MRI HEAD WITHOUT CONTRAST TECHNIQUE: Multiplanar, multiecho pulse sequences of the brain and surrounding structures were obtained without intravenous contrast. COMPARISON:  None Available. FINDINGS: Brain: No acute infarct, mass effect or extra-axial collection. Unchanged focus of magnetic susceptibility effect within the right parietal white matter, possibly Brookelynn Hamor cavernous malformation or remote hemorrhage. There is multifocal hyperintense T2-weighted signal within the white matter. Parenchymal volume and CSF spaces are normal. Old, small bilateral occipital and cerebellar infarcts. The midline structures are normal. Vascular: Major flow voids are preserved. Skull and upper cervical spine: Normal calvarium and skull base. Visualized upper cervical spine and soft tissues are normal. Sinuses/Orbits:No paranasal sinus fluid levels or advanced mucosal thickening. No mastoid or middle ear effusion. Normal orbits. IMPRESSION: 1. No acute intracranial abnormality. 2. Chronic small vessel ischemia and multiple old small vessel infarcts. Electronically Signed   By: Deatra Robinson M.D.   On: 09/14/2022 21:28        Scheduled Meds:  amiodarone  100 mg Oral Daily   brinzolamide  1 drop Left Eye BID   And   brimonidine  1 drop Left Eye BID   Chlorhexidine Gluconate Cloth  6 each Topical Q0600   Chlorhexidine Gluconate Cloth  6 each Topical Q0600   cinacalcet  30 mg Oral TID with meals   insulin aspart  0-6 Units Subcutaneous TID WC   lipase/protease/amylase  36,000 Units Oral TID WC   metoprolol tartrate  25 mg Oral BID   neomycin-polymyxin b-dexamethasone  1 drop Left Eye TID   prednisoLONE acetate  1 drop Left Eye QID   rOPINIRole  0.25 mg Oral QHS   sevelamer carbonate  1,600 mg Oral TID WC   Continuous Infusions:  diltiazem (CARDIZEM) infusion 12.5 mg/hr (09/16/22 0807)      LOS: 1 day    Time spent: over 30 min    Lacretia Nicks, MD [facility]   To contact the attending provider between 7A-7P or the covering provider during after hours 7P-7A, please log into the web site [email or web address] and access using universal [place] password for that web site. If you do not have the password, please call the hospital operator.  09/16/2022, 9:04 AM

## 2022-09-17 DIAGNOSIS — I4891 Unspecified atrial fibrillation: Secondary | ICD-10-CM | POA: Diagnosis not present

## 2022-09-17 LAB — GLUCOSE, CAPILLARY
Glucose-Capillary: 139 mg/dL — ABNORMAL HIGH (ref 70–99)
Glucose-Capillary: 145 mg/dL — ABNORMAL HIGH (ref 70–99)
Glucose-Capillary: 189 mg/dL — ABNORMAL HIGH (ref 70–99)
Glucose-Capillary: 123 mg/dL — ABNORMAL HIGH (ref 70–99)

## 2022-09-17 LAB — RENAL FUNCTION PANEL
Albumin: 2.4 g/dL — ABNORMAL LOW (ref 3.5–5.0)
Anion gap: 16 — ABNORMAL HIGH (ref 5–15)
BUN: 38 mg/dL — ABNORMAL HIGH (ref 8–23)
CO2: 24 mmol/L (ref 22–32)
Calcium: 7.9 mg/dL — ABNORMAL LOW (ref 8.9–10.3)
Chloride: 97 mmol/L — ABNORMAL LOW (ref 98–111)
Creatinine, Ser: 3.59 mg/dL — ABNORMAL HIGH (ref 0.44–1.00)
GFR, Estimated: 12 mL/min — ABNORMAL LOW
Glucose, Bld: 120 mg/dL — ABNORMAL HIGH (ref 70–99)
Phosphorus: 8.2 mg/dL — ABNORMAL HIGH (ref 2.5–4.6)
Potassium: 4.1 mmol/L (ref 3.5–5.1)
Sodium: 137 mmol/L (ref 135–145)

## 2022-09-17 MED ORDER — PREDNISOLONE ACETATE 1 % OP SUSP
1.0000 [drp] | Freq: Three times a day (TID) | OPHTHALMIC | Status: DC
  Administered 2022-09-17 – 2022-09-23 (×17): 1 [drp] via OPHTHALMIC
  Filled 2022-09-17: qty 5

## 2022-09-17 MED ORDER — VALACYCLOVIR HCL 500 MG PO TABS
500.0000 mg | ORAL_TABLET | Freq: Every day | ORAL | Status: DC
  Administered 2022-09-17 – 2022-09-23 (×7): 500 mg via ORAL
  Filled 2022-09-17 (×8): qty 1

## 2022-09-17 MED ORDER — ERYTHROMYCIN 5 MG/GM OP OINT
TOPICAL_OINTMENT | Freq: Two times a day (BID) | OPHTHALMIC | Status: DC
  Administered 2022-09-19: 1 via OPHTHALMIC
  Filled 2022-09-17: qty 3.5

## 2022-09-17 NOTE — Evaluation (Signed)
Clinical/Bedside Swallow Evaluation Patient Details  Name: Kim Burns MRN: [id number] Date of Birth: 30-Oct-1935  Today's Date: 09/17/2022 Time: SLP Start Time (ACUTE ONLY): 0845 SLP Stop Time (ACUTE ONLY): 0900 SLP Time Calculation (min) (ACUTE ONLY): 15 min  Past Medical History:  Past Medical History:  Diagnosis Date   Anemia of chronic disease    takes iron   Anxiety    Blood transfusion without reported diagnosis    Bradycardia    Diabetes mellitus without complication (HCC)    became diabetic after Whipple procedure   Diarrhea    Dysrhythmia 04/16/2016   bradycardia due to medication    ESRD on hemodialysis [facility])    M-W-F   GERD (gastroesophageal reflux disease)    Gout    Headache    History of kidney stones 06/2013   Hyperparathyroidism (HCC)    Hypertension    PONV (postoperative nausea and vomiting)    Sleep apnea    no cpap machine. could not tolerate   Stroke (HCC) 04/16/2016   TIA 1995   Vitamin D deficiency    Past Surgical History:  Past Surgical History:  Procedure Laterality Date   A/V FISTULAGRAM Left 08/30/2021   Procedure: A/V Fistulagram;  Surgeon: Cephus Shelling, MD;  Location: [facility] INVASIVE CV LAB;  Service: Cardiovascular;  Laterality: Left;   ABDOMINAL HYSTERECTOMY  1985   complete   BACK SURGERY  1980   lower   BASCILIC VEIN TRANSPOSITION Left 04/24/2017   Procedure: LEFT ARM FIRST STAGE BASILIC VEIN TRANSPOSITION;  Surgeon: Nada Libman, MD;  Location: [facility] OR;  Service: Vascular;  Laterality: Left;   BASCILIC VEIN TRANSPOSITION Left 07/10/2017   Procedure: SECOND STAGE BASILIC VEIN TRANSPOSITION LEFT ARM;  Surgeon: Nada Libman, MD;  Location: [facility] OR;  Service: Vascular;  Laterality: Left;   BREAST LUMPECTOMY Left x 2   many years apart, benign   CHOLECYSTECTOMY  1985   CYSTOSCOPY W/ URETERAL STENT PLACEMENT Left 10/31/2021   Procedure: CYSTOSCOPY WITH RETROGRADE PYELOGRAM/URETERAL STENT PLACEMENT;  Surgeon: Crist Fat, MD;  Location: [facility] OR;  Service: Urology;  Laterality: Left;   CYSTOSCOPY WITH URETEROSCOPY AND STENT PLACEMENT Left 06/18/2013   Procedure: CYSTOSCOPY WITH Lef URETEROSCOPY AND Left STENT PLACEMENT;  Surgeon: Crecencio Mc, MD;  Location: [facility] ORS;  Service: Urology;  Laterality: Left;   ESOPHAGOGASTRODUODENOSCOPY (EGD) WITH PROPOFOL N/A 11/13/2016   Procedure: ESOPHAGOGASTRODUODENOSCOPY (EGD) WITH PROPOFOL;  Surgeon: Kathi Der, MD;  Location: [facility] ENDOSCOPY;  Service: Gastroenterology;  Laterality: N/A;   EUS N/A 02/07/2016   Procedure: ESOPHAGEAL ENDOSCOPIC ULTRASOUND (EUS) RADIAL;  Surgeon: Willis Modena, MD;  Location: [facility] ENDOSCOPY;  Service: Endoscopy;  Laterality: N/A;   EYE SURGERY Bilateral 2014   ioc for catracts    FOOT SURGERY Left 1990   something with toes unsure what    HERNIA REPAIR  2018   IR CATHETER TUBE CHANGE  03/14/2022   IR EMBO TUMOR ORGAN ISCHEMIA INFARCT INC GUIDE ROADMAPPING  08/22/2021   IR NEPHROSTOMY EXCHANGE LEFT  06/27/2022   IR NEPHROSTOMY EXCHANGE LEFT  08/26/2022   IR NEPHROSTOMY PLACEMENT LEFT  03/14/2022   IR RADIOLOGIST EVAL & MGMT  05/18/2021   IR RADIOLOGIST EVAL & MGMT  10/01/2021   IR RADIOLOGIST EVAL & MGMT  11/12/2021   IR RADIOLOGIST EVAL & MGMT  11/22/2021   IR RADIOLOGIST EVAL & MGMT  11/27/2021   IR RADIOLOGIST EVAL & MGMT  12/20/2021   IR RENAL SUPRASEL UNI S&I MOD SED  08/22/2021   IR US GUIDE VASC ACCESS RIGHT  08/22/2021   LEFT HEART CATH AND CORONARY ANGIOGRAPHY N/A 08/27/2021   Procedure: LEFT HEART CATH AND CORONARY ANGIOGRAPHY;  Surgeon: Kathleene Hazel, MD;  Location: [facility] INVASIVE CV LAB;  Service: Cardiovascular;  Laterality: N/A;   PARATHYROID EXPLORATION     PERIPHERAL VASCULAR BALLOON ANGIOPLASTY Left 08/30/2021   Procedure: PERIPHERAL VASCULAR BALLOON ANGIOPLASTY;  Surgeon: Cephus Shelling, MD;  Location: [facility] INVASIVE CV LAB;  Service: Cardiovascular;  Laterality: Left;  arm fistula   RADIOLOGY WITH ANESTHESIA Left 08/22/2021    Procedure: MICROWAVE ABLATION;  Surgeon: Simonne Come, MD;  Location: [facility] ORS;  Service: Anesthesiology;  Laterality: Left;   WHIPPLE PROCEDURE N/A 04/23/2016   Procedure: WHIPPLE PROCEDURE;  Surgeon: Almond Lint, MD;  Location: [facility] OR;  Service: General;  Laterality: N/A;   HPI:  Pt is 87 year old presented to [facility] on  09/13/22 for afib with RVR. Daughter concerned about confusion and difficulty with speech on 7/27. MRI negative. CXR 09/14/22 "Atelectasis or infiltrates in the right mid/upper lung." PMH - afib, ESRD on HD, CHF, TIA, DM, HTN, Whipple.    Assessment / Plan / Recommendation  Clinical Impression  Pt seen for clinical swallowing evaluation. Pt presents with s/sx mild oral dysphagia c/b prolonged mastication of solids. Otherwise, oral phase was functional. Pharyngeal swallow appeared [facility] per clinical assessment. Oral deficits and inconsistent ability to feed self likely exacerbated by AMS. SLP to f/u per POC for diet tolerance and trials of upgraded consistencies pending improvements in mental status. SLP Visit Diagnosis: Dysphagia, oral phase (R13.11)    Aspiration Risk  Mild aspiration risk    Diet Recommendation Dysphagia 3 (Mech soft);Thin liquid    Liquid Administration via: Spoon;Cup;Straw Medication Administration: Whole meds with puree (vs with thin liquids) Supervision: Patient able to self feed;Staff to assist with self feeding;Full supervision/cueing for compensatory strategies (allow pt to feed self as much as possible) Compensations: Minimize environmental distractions;Slow rate;Small sips/bites Postural Changes: Seated upright at 90 degrees;Remain upright for at least 30 minutes after po intake    Other  Recommendations Oral Care Recommendations: Oral care BID;Staff/trained caregiver to provide oral care    Recommendations for follow up therapy are one component of a multi-disciplinary discharge planning process, led by the attending physician.  Recommendations may be  updated based on patient status, additional functional criteria and insurance authorization.  Follow up Recommendations  (TBD)         Functional Status Assessment Patient has had a recent decline in their functional status and demonstrates the ability to make significant improvements in function in a reasonable and predictable amount of time.  Frequency and Duration min 2x/week  2 weeks       Prognosis Prognosis for improved oropharyngeal function: Good Barriers to Reach Goals:  (AMS)      Swallow Study   General Date of Onset: 09/13/22 HPI: Pt is 86 year old presented to [facility] on  09/13/22 for afib with RVR. Daughter concerned about confusion and difficulty with speech on 7/27. MRI negative. CXR 09/14/22 "Atelectasis or infiltrates in the right mid/upper lung." PMH - afib, ESRD on HD, CHF, TIA, DM, HTN, Whipple. Type of Study: Bedside Swallow Evaluation Previous Swallow Assessment: none Diet Prior to this Study: NPO Temperature Spikes Noted: Yes (low grade) Respiratory Status: Nasal cannula History of Recent Intubation: No Behavior/Cognition: Alert;Pleasant mood;Confused Oral Cavity Assessment: Within Functional Limits Oral Care Completed by SLP: Yes Oral Cavity - Dentition:  (some  missing dentition) Vision: Functional for self-feeding Self-Feeding Abilities: Able to feed self;Needs set up;Needs assist Patient Positioning: Upright in bed Baseline Vocal Quality: Low vocal intensity Volitional Cough: Strong Volitional Swallow: Able to elicit    Oral/Motor/Sensory Function Overall Oral Motor/Sensory Function: Generalized oral weakness (vs effort)   Ice Chips Ice chips: Within functional limits Presentation: Spoon   Thin Liquid Thin Liquid: Within functional limits Presentation: Self Fed;Cup;Straw    Nectar Thick Nectar Thick Liquid: Not tested   Honey Thick Honey Thick Liquid: Not tested   Puree Puree: Within functional limits Presentation: Self Fed;Spoon Pharyngeal Phase  Impairments:  (WFL)   Solid     Solid: Within functional limits Presentation: Self Fed Oral Phase Impairments: Impaired mastication Oral Phase Functional Implications: Impaired mastication Pharyngeal Phase Impairments:  (WFL)     Clyde Canterbury, M.S., CCC-SLP Speech-Language Pathologist Secure Chat Preferred  O: [phone]  Alessandra Bevels Faythe Heitzenrater 09/17/2022,10:09 AM

## 2022-09-17 NOTE — Progress Notes (Addendum)
Right anterior arm blister measures 20cm in length , 14cm in width site marked with pen noted to be slowly getting bigger. Arm elevated with pillow. MD made aware. Continue to monitor.

## 2022-09-17 NOTE — Progress Notes (Signed)
[facility] Progress Note   Subjective:   Dialysis not completed yesterday - for HD today.  She is up in a chair and much more interactive today.  Remains on dilt gtt  Objective Vitals:   09/16/22 1900 09/16/22 2300 09/17/22 0300 09/17/22 0822  BP:    (!) 121/52  Pulse:  98  94  Resp:   19 17  Temp: 98.5 F (36.9 C) 97.6 F (36.4 C) 97.6 F (36.4 C) 98.9 F (37.2 C)  TempSrc: Axillary Oral Oral Axillary  SpO2:    99%  Weight:   54.6 kg    Physical Exam General: up in chair awake and alert Heart: a fib rate 80s, no rub Lungs: clear ant Abdomen: soft Extremities: no edema Dialysis Access:  LUE AVF +t/b  Additional Objective Labs: Basic Metabolic Panel: Recent Labs  Lab 09/15/22 0036 09/16/22 0606 09/17/22 0131  NA 131* 136 137  K 4.2 3.8 4.1  CL 94* 99 97*  CO2 22 22 24   GLUCOSE 123* 139* 120*  BUN 48* 25* 38*  CREATININE 4.03* 2.74* 3.59*  CALCIUM 7.5* 8.0* 7.9*  PHOS 9.0* 6.2* 8.2*   Liver Function Tests: Recent Labs  Lab 09/14/22 0506 09/15/22 0036 09/16/22 0606 09/17/22 0131  AST 51* 27 23  --   ALT 90* 72* 54*  --   ALKPHOS 72 68 67  --   BILITOT 0.4 0.7 1.1  --   PROT 5.8* 5.7* 5.7*  --   ALBUMIN 2.7* 2.8* 2.6* 2.4*   No results for input(s): "LIPASE", "AMYLASE" in the last 168 hours. CBC: Recent Labs  Lab 09/13/22 2342 09/14/22 0506 09/15/22 0036 09/16/22 0606 09/17/22 0131  WBC 10.0 9.7 12.8* 13.2* 9.9  NEUTROABS  --  8.3* 11.1* 12.1*  --   HGB 10.8* 10.5* 10.2* 10.4* 10.5*  HCT 34.9* 34.1* 32.3* 33.8* 32.7*  MCV 92.8 92.7 91.8 93.1 94.0  PLT 226 233 236 230 229   Blood Culture    Component Value Date/Time   SDES BLOOD RIGHT ANTECUBITAL 01/22/2022 0038   SPECREQUEST  01/22/2022 0038    BOTTLES DRAWN AEROBIC AND ANAEROBIC Blood Culture results may not be optimal due to an inadequate volume of blood received in culture bottles   CULT  01/22/2022 0038    NO GROWTH 5 DAYS Performed at [facility], [address], [place], [place] [address]    REPTSTATUS 01/27/2022 FINAL 01/22/2022 0038    Cardiac Enzymes: No results for input(s): "CKTOTAL", "CKMB", "CKMBINDEX", "TROPONINI" in the last 168 hours. CBG: Recent Labs  Lab 09/16/22 0602 09/16/22 1152 09/16/22 1616 09/16/22 2050 09/17/22 0615  GLUCAP 143* 133* 121* 111* 139*   Iron Studies: No results for input(s): "IRON", "TIBC", "TRANSFERRIN", "FERRITIN" in the last 72 hours. @lablastinr3 @ Studies/Results: ECHOCARDIOGRAM COMPLETE  Result Date: 09/15/2022    ECHOCARDIOGRAM REPORT   Patient Name:   Kim Burns Date of Exam: 09/15/2022 Medical Rec #:  [id number]        Height:       62.0 in Accession #:    [id number]       Weight:       116.4 lb Date of Birth:  Feb 01, 1936        BSA:          1.519 m Patient Age:    87 years         BP:           158/89 mmHg Patient Gender: F  HR:           88 bpm. Exam Location:  Inpatient Procedure: 2D Echo, Cardiac Doppler and Color Doppler Indications:    Atrial fibrillation  History:        Patient has prior history of Echocardiogram examinations, most                 recent 08/27/2021. CHF, end stage renal disease; Risk                 Factors:Diabetes and Hypertension.  Sonographer:    [name] RDCS Referring Phys: [phone] A CALDWELL POWELL JR  Sonographer Comments: Tremors. IMPRESSIONS  1. Left ventricular ejection fraction, by estimation, is 55 to 60%. The left ventricle has normal function. The left ventricle has no regional wall motion abnormalities. Left ventricular diastolic parameters are indeterminate.  2. Right ventricular systolic function is mildly reduced. The right ventricular size is normal. There is severely elevated pulmonary artery systolic pressure.  3. Left atrial size was severely dilated.  4. Right atrial size was mild to moderately dilated.  5. The mitral valve is degenerative. Mild mitral valve regurgitation. Moderate mitral annular calcification.  6. Tricuspid valve  regurgitation is moderate.  7. The aortic valve is tricuspid. Aortic valve regurgitation is not visualized.  8. The inferior vena cava is normal in size with greater than 50% respiratory variability, suggesting right atrial pressure of 3 mmHg. FINDINGS  Left Ventricle: Left ventricular ejection fraction, by estimation, is 55 to 60%. The left ventricle has normal function. The left ventricle has no regional wall motion abnormalities. The left ventricular internal cavity size was normal in size. There is  no left ventricular hypertrophy. Left ventricular diastolic parameters are indeterminate. Right Ventricle: The right ventricular size is normal. Right ventricular systolic function is mildly reduced. There is severely elevated pulmonary artery systolic pressure. The tricuspid regurgitant velocity is 3.90 m/s, and with an assumed right atrial pressure of 3 mmHg, the estimated right ventricular systolic pressure is 63.8 mmHg. Left Atrium: Left atrial size was severely dilated. Right Atrium: Right atrial size was mild to moderately dilated. Pericardium: There is no evidence of pericardial effusion. Mitral Valve: The mitral valve is degenerative in appearance. Moderate mitral annular calcification. Mild mitral valve regurgitation. Tricuspid Valve: The tricuspid valve is normal in structure. Tricuspid valve regurgitation is moderate. Aortic Valve: The aortic valve is tricuspid. Aortic valve regurgitation is not visualized. Pulmonic Valve: The pulmonic valve was normal in structure. Pulmonic valve regurgitation is not visualized. Aorta: The aortic root and ascending aorta are structurally normal, with no evidence of dilitation. Venous: The inferior vena cava is normal in size with greater than 50% respiratory variability, suggesting right atrial pressure of 3 mmHg. IAS/Shunts: No atrial level shunt detected by color flow Doppler.  LEFT VENTRICLE PLAX 2D LVIDd:         4.40 cm   Diastology LVIDs:         3.30 cm   LV e'  medial:  6.74 cm/s LV PW:         0.90 cm   LV e' lateral: 9.03 cm/s LV IVS:        1.00 cm LVOT diam:     1.80 cm LV SV:         45 LV SV Index:   29 LVOT Area:     2.54 cm  RIGHT VENTRICLE            IVC RV Basal diam:  3.00 cm  IVC diam: 1.90 cm RV S prime:     8.81 cm/s TAPSE (M-mode): 1.1 cm LEFT ATRIUM             Index        RIGHT ATRIUM           Index LA diam:        3.90 cm 2.57 cm/m   RA Area:     16.70 cm LA Vol (A2C):   95.7 ml 63.01 ml/m  RA Volume:   40.70 ml  26.80 ml/m LA Vol (A4C):   77.1 ml 50.76 ml/m LA Biplane Vol: 86.8 ml 57.15 ml/m  AORTIC VALVE LVOT Vmax:   85.10 cm/s LVOT Vmean:  57.500 cm/s LVOT VTI:    0.175 m  AORTA Ao Root diam: 3.00 cm Ao Asc diam:  2.90 cm TRICUSPID VALVE TR Peak grad:   60.8 mmHg TR Vmax:        390.00 cm/s  SHUNTS Systemic VTI:  0.18 m Systemic Diam: 1.80 cm Carolan Clines Electronically signed by Carolan Clines Signature Date/Time: 09/15/2022/1:17:18 PM    Final    Medications:  diltiazem (CARDIZEM) infusion 10 mg/hr (09/17/22 0800)    amiodarone  100 mg Oral Daily   brinzolamide  1 drop Left Eye BID   And   brimonidine  1 drop Left Eye BID   Chlorhexidine Gluconate Cloth  6 each Topical Q0600   Chlorhexidine Gluconate Cloth  6 each Topical Q0600   cinacalcet  30 mg Oral TID with meals   insulin aspart  0-6 Units Subcutaneous TID WC   lipase/protease/amylase  36,000 Units Oral TID WC   metoprolol tartrate  25 mg Oral BID   neomycin-polymyxin b-dexamethasone  1 drop Left Eye TID   prednisoLONE acetate  1 drop Left Eye QID   rOPINIRole  0.25 mg Oral QHS   sevelamer carbonate  1,600 mg Oral TID WC    OP HD: [facility] MWF  3.5h  350/ 1.5  55kg  2/2 bath  AVF   Heparin none - last OP HD 7/26, post wt 57.5kg   - coming up 2-3kg over dry wt for last 3 wks - hectorol 5 mcg IV three times per week - mircera 75 mcg IV q 2 wks, last 7/17, due 7/31     Assessment/ Plan: Afib w/ RVR - per pmd/ cardiology currently getting IV dilt gtt and po amio (was on  at home). Metoprolol added, coreg on hold. Not on a/c due to bleeding risk. Per cardiology.  AMS - likely due to excessive dosing of the valacyclovir at 1 gm tid; the adjustment for esrd patient would be 500 mg daily. Currently on hold - cont to hold for now but clarifying indication for use.  Mental status improving.  Imaging was unrevealing for CVA.    ESRD - on HD MWF, last HD Friday. Had HD overnight 7/28-29 in part to dialyze acyclovir.  HD bumped to today - plan in room given dilt gtt.   HTN/ volume - euvolemic on exam, 2kg under dry wt, BP's normal. No UF with HD today.  Anemia esrd - Hb 10-11 here, next esa due on 7/31. Follow.  MBD ckd - CCa in range, phos is high. Cont binder, renal diet and HD for high phos. Cont IV vdra, sensipar 30 tid.  DM2 - on insulin  Estill Bakes MD 09/17/2022, 8:52 AM  [facility] Pager: [phone]

## 2022-09-17 NOTE — Progress Notes (Addendum)
PROGRESS NOTE    Kim Burns  NGE:[id number] DOB: 07/29/1935 DOA: 09/13/2022 PCP: Kim Moccasin, MD  Chief Complaint  Patient presents with   Weakness   Fatigue   Tachycardia    Brief Narrative:   Kim Burns is Kim Burns 87 y.o. female with medical history significant for paroxysmal atrial fibrillation, end-stage renal disease on hemodialysis on Monday, Wednesday, Friday schedule, type 2 diabetes mellitus, essential hypertension, chronic diastolic heart failure, who is admitted to [facility] on 09/13/2022 with atrial fibrillation with RVR after presenting from home to [facility] ED complaining of palpitations.   She was admitted for atrial fibrillation with RVR.  Hospitalization complicated by AMS related to valtrex in setting of ESRD.  She's improving from mental status standpoint and HR standpoint.  Hopefully can consider discharge in Marvetta Vohs couple days.   Assessment & Plan:   Principal Problem:   Atrial fibrillation with RVR (HCC) Active Problems:   End-stage renal disease on hemodialysis (HCC)   DM2 (diabetes mellitus, type 2) (HCC)   Chronic diastolic CHF (congestive heart failure) (HCC)   History of anemia due to chronic kidney disease   Essential hypertension  Acute Metabolic Encephalopathy Myoclonus  Encephalopathic still this morning, but jerking/rigidity of upper extremities is improved (she received valium this morning around 5 - will d/c this and follow) MRI was negative for stroke Will follow EEG -> excess low voltage fast activity c/w medication effect, mild to moderate generalized slowing of background c/w mild to moderate degree of diffuse or multifocal cerebral dysfunction (see report) and UA (pending collection) Appreciate nephrology's input, valtrex inappropriately dosed for her renal dysfunction -> this can cause myoclonus, delirium, encephalopathy, seizures, etc --- suspect this is the most likely cause of her encephalopathy and myoclonic jerks  S/p  dialysis Sunday night, next scheduled for today She's improved Valtrex currently on hold - will resume when ok with renal Delirium precautions  Atrial Fibrillation with RVR Unclear precipitant  TSH wnl, CXR with atelectasis/infiltrates in R mid/upper lung Had converted on amiodarone without recurrence after ablation procedure July 2023 - was on low dose amiodarone and coreg prior to admission Rates improved today, will wean off dilt gtt as able (rate had been ok on metoprolol and amiodarone until missed dose of metoprolol) Continue po metop and amiodarone  Coreg currently on hold (will try metop instead) Not on anticoagulation after discussion with cardiology outpatient Appreciate assistance of cardiology --- bleeding risk outweigh benefit of stroke prevention based on shared decision making with cards -> consider IV amiodarone if rates difficult to control with dilt alone  ESRD  Volume Overload  Bilateral Lower Extremity Edema Volume per renal with dialysis, appreciate assistance  Diastolic Heart Failure Moderately Reduced RVSF Moderately Elevated PASP Volume per renal  Echo 08/2021 with moderately reduced RVSF, moderately elevated PASP, grade III diastolic dysfunction   Prolonged Qtc Daily EKG Caution with qt prolonging meds  Hsv keratitis vs zoster ophthalmicus Discussed with Dr. Sinda Burns 7/30 (office [phone])  He recommended follow up immediately after this hospitalization (if she goes to snf transportation needs to be arranged to follow with him).  Continue valtrex (when appropriate), prednisolone acetate ophthalmic suspension, brinzolamide brimonidine ophthalmic suspension, erythromycin ointment (ok stopping the neomycin-polymixin b-dexamethasone ophthalmic suspension).    T2DM SSI  A1c 5.8  Hypertension On coreg, but uses midodrine on dialysis days Coreg currently on hold, follow BP on metop  Anemia of Chronic Kidney Disease Noted  Elevated LFT's Noted,  will trend  RLS  Requip     DVT prophylaxis: SCD Code Status: full Family Communication: daughter at bedside Disposition:   Status is: Observation The patient remains OBS appropriate and will d/c before 2 midnights.   Consultants:  renal  Procedures:  none  Antimicrobials:  Anti-infectives (From admission, onward)    Start     Dose/Rate Route Frequency Ordered Stop   09/14/22 2200  valACYclovir (VALTREX) tablet 1,000 mg  Status:  Discontinued        1,000 mg Oral 3 times daily 09/14/22 1716 09/15/22 0956       Subjective: Sitting up in chair Tired   Objective: Vitals:   09/16/22 2300 09/17/22 0300 09/17/22 0822 09/17/22 1122  BP:   (!) 121/52 132/66  Pulse: 98  94 86  Resp:  19 17 19   Temp: 97.6 F (36.4 C) 97.6 F (36.4 C) 98.9 F (37.2 C) (!) 97.5 F (36.4 C)  TempSrc: Oral Oral Axillary Oral  SpO2:   99% (!) 86%  Weight:  54.6 kg      Intake/Output Summary (Last 24 hours) at 09/17/2022 1427 Last data filed at 09/17/2022 1100 Gross per 24 hour  Intake 183.42 ml  Output 50 ml  Net 133.42 ml   Filed Weights   09/16/22 0246 09/16/22 0315 09/17/22 0300  Weight: 57.6 kg 55.4 kg 54.6 kg    Examination:  General: No acute distress. Cardiovascular: RRR Lungs: unlabored Abdomen: Soft, nontender, nondistended  Neurological: Alert, generally weak improving Extremities: No clubbing or cyanosis. No edema  Data Reviewed: I have personally reviewed following labs and imaging studies  CBC: Recent Labs  Lab 09/13/22 2342 09/14/22 0506 09/15/22 0036 09/16/22 0606 09/17/22 0131  WBC 10.0 9.7 12.8* 13.2* 9.9  NEUTROABS  --  8.3* 11.1* 12.1*  --   HGB 10.8* 10.5* 10.2* 10.4* 10.5*  HCT 34.9* 34.1* 32.3* 33.8* 32.7*  MCV 92.8 92.7 91.8 93.1 94.0  PLT 226 233 236 230 229    Basic Metabolic Panel: Recent Labs  Lab 09/13/22 2342 09/14/22 0506 09/15/22 0036 09/16/22 0606 09/17/22 0131  NA 136 135 131* 136 137  K 3.6 3.7 4.2 3.8 4.1  CL 95* 95*  94* 99 97*  CO2 26 24 22 22 24   GLUCOSE 167* 175* 123* 139* 120*  BUN 34* 38* 48* 25* 38*  CREATININE 3.30* 3.40* 4.03* 2.74* 3.59*  CALCIUM 7.9* 7.6* 7.5* 8.0* 7.9*  MG 2.4 2.2 2.2 1.8  --   PHOS  --   --  9.0* 6.2* 8.2*    GFR: Estimated Creatinine Clearance: 9.1 mL/min (Kim Burns) (by C-G formula based on SCr of 3.59 mg/dL (H)).  Liver Function Tests: Recent Labs  Lab 09/14/22 0506 09/15/22 0036 09/16/22 0606 09/17/22 0131  AST 51* 27 23  --   ALT 90* 72* 54*  --   ALKPHOS 72 68 67  --   BILITOT 0.4 0.7 1.1  --   PROT 5.8* 5.7* 5.7*  --   ALBUMIN 2.7* 2.8* 2.6* 2.4*    CBG: Recent Labs  Lab 09/16/22 1152 09/16/22 1616 09/16/22 2050 09/17/22 0615 09/17/22 1117  GLUCAP 133* 121* 111* 139* 145*     No results found for this or any previous visit (from the past 240 hour(s)).       Radiology Studies: No results found.      Scheduled Meds:  amiodarone  100 mg Oral Daily   brinzolamide  1 drop Left Eye BID   And   brimonidine  1 drop Left  Eye BID   Chlorhexidine Gluconate Cloth  6 each Topical Q0600   Chlorhexidine Gluconate Cloth  6 each Topical Q0600   cinacalcet  30 mg Oral TID with meals   insulin aspart  0-6 Units Subcutaneous TID WC   lipase/protease/amylase  36,000 Units Oral TID WC   metoprolol tartrate  25 mg Oral BID   neomycin-polymyxin b-dexamethasone  1 drop Left Eye TID   prednisoLONE acetate  1 drop Left Eye QID   rOPINIRole  0.25 mg Oral QHS   sevelamer carbonate  1,600 mg Oral TID WC   Continuous Infusions:  diltiazem (CARDIZEM) infusion 10 mg/hr (09/17/22 1100)      LOS: 2 days    Time spent: over 30 min    Lacretia Nicks, MD [facility]   To contact the attending provider between 7A-7P or the covering provider during after hours 7P-7A, please log into the web site [email or web address] and access using universal [place] password for that web site. If you do not have the password, please call the hospital  operator.  09/17/2022, 2:27 PM

## 2022-09-17 NOTE — Plan of Care (Signed)

## 2022-09-17 NOTE — Progress Notes (Signed)
Patient is alert and oriented x2. Dialysis did not complete treatment last night due to machine issues per other staff nurse on unit. Patient has a large blood blister or area of bruising to right arm that was there prior to shift start. Appears that an IV was in that area that may have infiltrated. Looked on the LDA and that AC IV was removed on the morning of 7/29. Current hand IV is working properly. Concerns for necrosis if Cardizem was going through that previous IV-this was unreported. Will report to day team.

## 2022-09-17 NOTE — Evaluation (Signed)
Physical Therapy Evaluation Patient Details Name: Kim Burns MRN: [id number] DOB: Jan 14, 1936 Today's Date: 09/17/2022  History of Present Illness  Pt is 87 year old presented to [facility] on  09/13/22 for afib with RVR. Daughter concerned about confusion and difficulty with speech on 7/27. MRI negative. Pt on higher dose of Valtrex than needed for a pt with renal failure and this resulted in confusion, lethargy, jerking etc. PMH - afib, ESRD on HD, chf, DM, HTN, Whipple, renal cell mass  Clinical Impression  Pt admitted with above diagnosis and presents to PT with functional limitations due to deficits listed below (See PT problem list). Pt needs skilled PT to maximize independence and safety. Pt typically fairly independent at home and has had family support. Currently pt weak and requiring assist for all mobility. Expect she will make steady progress but will need assist at dc especially with flight of stairs to enter home. If family able to provide needed support may be able to go home with [facility]. If pt requiring more assist than family can provide then may need to look at other post acute options.           If plan is discharge home, recommend the following: A little help with walking and/or transfers;A little help with bathing/dressing/bathroom;Assistance with cooking/housework;Help with stairs or ramp for entrance;Assist for transportation   Can travel by private vehicle        Equipment Recommendations None recommended by PT  Recommendations for Other Services       Functional Status Assessment Patient has had a recent decline in their functional status and demonstrates the ability to make significant improvements in function in a reasonable and predictable amount of time.     Precautions / Restrictions Precautions Precautions: Fall;Other (comment) Precaution Comments: RUE IV infiltration      Mobility  Bed Mobility Overal bed mobility: Needs Assistance Bed Mobility: Supine to  Sit     Supine to sit: Mod assist     General bed mobility comments: Assist to elevate trunk and bring hips to EOB    Transfers Overall transfer level: Needs assistance Equipment used: 1 person hand held assist, Rolling walker (2 wheels) Transfers: Sit to/from Stand, Bed to chair/wheelchair/BSC Sit to Stand: Mod assist   Step pivot transfers: Mod assist, Min assist       General transfer comment: Assist to power up and stabilize for sit to stand with walker or with forearm support. Initial step pivot with forearm support of LUE requiring mod assist to support and stabilize. Step pivot with walker with min assist.    Ambulation/Gait               General Gait Details: Did not attempt  Stairs            Wheelchair Mobility     Tilt Bed    Modified Rankin (Stroke Patients Only)       Balance Overall balance assessment: Needs assistance Sitting-balance support: No upper extremity supported, Feet supported Sitting balance-Leahy Scale: Fair     Standing balance support: Single extremity supported, Bilateral upper extremity supported, During functional activity, Reliant on assistive device for balance Standing balance-Leahy Scale: Poor Standing balance comment: UE support and min assist for static standing                             Pertinent Vitals/Pain Pain Assessment Pain Assessment: No/denies pain    Home Living Family/patient  expects to be discharged to:: Private residence Living Arrangements: Alone Available Help at Discharge: Family;Available 24 hours/day Type of Home: House Home Access: Stairs to enter Entrance Stairs-Rails: Left Entrance Stairs-Number of Steps: 13 Alternate Level Stairs-Number of Steps: 13 Home Layout: Two level;Able to live on main level with bedroom/bathroom Home Equipment: Rolling Walker (2 wheels);Cane - single point;Shower seat      Prior Function Prior Level of Function : Independent/Modified  Independent             Mobility Comments: furniture walks in her home, uses RW outside of her home ADLs Comments: independent in self care, light meal prep and light houskeeping     Hand Dominance   Dominant Hand: Right    Extremity/Trunk Assessment   Upper Extremity Assessment Upper Extremity Assessment: Defer to OT evaluation    Lower Extremity Assessment Lower Extremity Assessment: Generalized weakness       Communication   Communication: No difficulties  Cognition Arousal/Alertness: Awake/alert Behavior During Therapy: WFL for tasks assessed/performed Overall Cognitive Status: Impaired/Different from baseline Area of Impairment: Problem solving, Orientation, Memory, Following commands, Awareness                 Orientation Level: Disoriented to, Time, Situation   Memory: Decreased short-term memory Following Commands: Follows one step commands with increased time, Follows one step commands consistently   Awareness: Emergent Problem Solving: Slow processing, Requires tactile cues, Requires verbal cues          General Comments General comments (skin integrity, edema, etc.): VSS on 5L O2    Exercises     Assessment/Plan    PT Assessment Patient needs continued PT services  PT Problem List Decreased strength;Decreased activity tolerance;Decreased balance;Decreased mobility;Decreased cognition       PT Treatment Interventions DME instruction;Gait training;Stair training;Functional mobility training;Therapeutic activities;Balance training;Therapeutic exercise;Patient/family education    PT Goals (Current goals can be found in the Care Plan section)  Acute Rehab PT Goals Patient Stated Goal: return home PT Goal Formulation: With patient Time For Goal Achievement: 09/30/22 Potential to Achieve Goals: Good    Frequency Min 1X/week     Co-evaluation               AM-PAC PT "6 Clicks" Mobility  Outcome Measure Help needed turning from  your back to your side while in a flat bed without using bedrails?: A Lot Help needed moving from lying on your back to sitting on the side of a flat bed without using bedrails?: A Lot Help needed moving to and from a bed to a chair (including a wheelchair)?: A Lot Help needed standing up from a chair using your arms (e.g., wheelchair or bedside chair)?: A Lot Help needed to walk in hospital room?: Total Help needed climbing 3-5 steps with a railing? : Total 6 Click Score: 10    End of Session Equipment Utilized During Treatment: Oxygen Activity Tolerance: Patient limited by fatigue Patient left: in chair;with call bell/phone within reach;with chair alarm set Nurse Communication: Mobility status PT Visit Diagnosis: Other abnormalities of gait and mobility (R26.89);Muscle weakness (generalized) (M62.81);Difficulty in walking, not elsewhere classified (R26.2)    Time: 1610-9604 PT Time Calculation (min) (ACUTE ONLY): 29 min   Charges:   PT Evaluation $PT Eval Moderate Complexity: 1 Mod PT Treatments $Therapeutic Activity: 8-22 mins PT General Charges $$ ACUTE PT VISIT: 1 Visit         [facility] PT Acute Rehabilitation Services Office [phone]   [name] [facility]  09/17/2022, 11:03 AM

## 2022-09-17 NOTE — Progress Notes (Signed)
Complained of leg cramp on right leg. Ativan given. Continue to monitor.

## 2022-09-17 NOTE — Evaluation (Signed)
Occupational Therapy Evaluation Patient Details Name: Kim Burns MRN: [id number] DOB: 06/06/1935 Today's Date: 09/17/2022   History of Present Illness Pt is 87 year old presented to [facility] on  09/13/22 for afib with RVR. Daughter concerned about confusion and difficulty with speech on 7/27. MRI negative. Pt on higher dose of Valtrex than needed for a pt with renal failure and this resulted in confusion, lethargy, jerking etc. PMH - afib, ESRD on HD, chf, DM, HTN, Whipple, renal cell mass   Clinical Impression   Pt reports ind at baseline with ADLs and some IADLs, sponge bathes at baseline, pt does not use AD, lives alone but per chart review her daughter or son are always with her. Pt currently needing set up - mod A for ADLs, mod A for step pivot transfer from [facility] > chair. Pt presenting with impairments listed below, will follow acutely. Recommend HHOT at d/c given family can provide level of assist needed at d/c.      Recommendations for follow up therapy are one component of a multi-disciplinary discharge planning process, led by the attending physician.  Recommendations may be updated based on patient status, additional functional criteria and insurance authorization.   Assistance Recommended at Discharge Intermittent Supervision/Assistance  Patient can return home with the following Assistance with cooking/housework;Direct supervision/assist for medications management;Direct supervision/assist for financial management;Assist for transportation;Help with stairs or ramp for entrance;A lot of help with bathing/dressing/bathroom;A lot of help with walking and/or transfers    Functional Status Assessment  Patient has had a recent decline in their functional status and demonstrates the ability to make significant improvements in function in a reasonable and predictable amount of time.  Equipment Recommendations  BSC/3in1    Recommendations for Other Services PT consult     Precautions /  Restrictions Precautions Precautions: Fall;Other (comment) Precaution Comments: RUE IV infiltration Restrictions Weight Bearing Restrictions: No      Mobility Bed Mobility               General bed mobility comments: on BSC with PT upon arrival    Transfers Overall transfer level: Needs assistance Equipment used: Rolling walker (2 wheels) Transfers: Sit to/from Stand, Bed to chair/wheelchair/BSC Sit to Stand: Mod assist     Step pivot transfers: Mod assist, Min assist            Balance Overall balance assessment: Needs assistance Sitting-balance support: No upper extremity supported, Feet supported Sitting balance-Leahy Scale: Fair     Standing balance support: Single extremity supported, Bilateral upper extremity supported, During functional activity, Reliant on assistive device for balance Standing balance-Leahy Scale: Poor Standing balance comment: UE support and min assist for static standing                           ADL either performed or assessed with clinical judgement   ADL Overall ADL's : Needs assistance/impaired Eating/Feeding: Set up;Sitting   Grooming: Set up;Sitting   Upper Body Bathing: Minimal assistance;Sitting   Lower Body Bathing: Moderate assistance;Sit to/from stand   Upper Body Dressing : Minimal assistance;Sitting   Lower Body Dressing: Moderate assistance;Sitting/lateral leans   Toilet Transfer: Minimal assistance;Ambulation;Moderate assistance   Toileting- Clothing Manipulation and Hygiene: Min guard       Functional mobility during ADLs: Rolling walker (2 wheels);Moderate assistance       Vision   Vision Assessment?: No apparent visual deficits     Perception Perception Perception Tested?: No   Praxis  Praxis Praxis tested?: Not tested    Pertinent Vitals/Pain Pain Assessment Pain Assessment: No/denies pain     Hand Dominance Right   Extremity/Trunk Assessment Upper Extremity Assessment Upper  Extremity Assessment: Generalized weakness (RUE edematous as IV infiltrated)   Lower Extremity Assessment Lower Extremity Assessment: Defer to PT evaluation   Cervical / Trunk Assessment Cervical / Trunk Assessment: Normal   Communication Communication Communication: No difficulties   Cognition Arousal/Alertness: Awake/alert Behavior During Therapy: WFL for tasks assessed/performed Overall Cognitive Status: Impaired/Different from baseline Area of Impairment: Problem solving, Orientation, Memory, Following commands, Awareness                 Orientation Level: Disoriented to, Time, Situation   Memory: Decreased short-term memory Following Commands: Follows one step commands with increased time, Follows one step commands consistently   Awareness: Emergent Problem Solving: Slow processing, Requires tactile cues, Requires verbal cues       General Comments  VSS on 5L O2    Exercises     Shoulder Instructions      Home Living Family/patient expects to be discharged to:: Private residence Living Arrangements: Alone Available Help at Discharge: Family;Available 24 hours/day Type of Home: House Home Access: Stairs to enter [employer] of Steps: 13 Entrance Stairs-Rails: Left Home Layout: Two level;Able to live on main level with bedroom/bathroom Alternate Level Stairs-Number of Steps: 13 Alternate Level Stairs-Rails: Left Bathroom Shower/Tub: Tub/shower unit;Walk-in shower;Sponge bathes at baseline   Bathroom Toilet: Handicapped height Bathroom Accessibility: Yes   Home Equipment: Agricultural consultant (2 wheels);Cane - single point;Shower seat          Prior Functioning/Environment Prior Level of Function : Independent/Modified Independent             Mobility Comments: furniture walks in her home, uses RW outside of her home ADLs Comments: independent in self care, light meal prep and light houskeeping, sits for bathing        OT Problem List:  Decreased strength;Decreased range of motion;Decreased activity tolerance;Impaired balance (sitting and/or standing);Decreased cognition;Decreased safety awareness;Cardiopulmonary status limiting activity      OT Treatment/Interventions: Self-care/ADL training;Therapeutic exercise;Energy conservation;DME and/or AE instruction;Therapeutic activities;Balance training;Patient/family education    OT Goals(Current goals can be found in the care plan section) Acute Rehab OT Goals Patient Stated Goal: none stated OT Goal Formulation: With patient Time For Goal Achievement: 10/01/22 Potential to Achieve Goals: Good ADL Goals Pt Will Perform Upper Body Dressing: with supervision;sitting Pt Will Perform Lower Body Dressing: with supervision;sitting/lateral leans;sit to/from stand Pt Will Transfer to Toilet: with supervision;ambulating;regular height toilet Additional ADL Goal #1: pt will perform bed mobility with supervision in prep for ADLs  OT Frequency: Min 1X/week    Co-evaluation              AM-PAC OT "6 Clicks" Daily Activity     Outcome Measure Help from another person eating meals?: A Little Help from another person taking care of personal grooming?: A Little Help from another person toileting, which includes using toliet, bedpan, or urinal?: A Little Help from another person bathing (including washing, rinsing, drying)?: A Lot Help from another person to put on and taking off regular upper body clothing?: A Little Help from another person to put on and taking off regular lower body clothing?: A Lot 6 Click Score: 16   End of Session Equipment Utilized During Treatment: Rolling walker (2 wheels);Oxygen Nurse Communication: Mobility status  Activity Tolerance: Patient tolerated treatment well Patient left: in chair;with call bell/phone  within reach;with chair alarm set  OT Visit Diagnosis: Unsteadiness on feet (R26.81);Other abnormalities of gait and mobility (R26.89);Muscle  weakness (generalized) (M62.81)                Time: 1191-4782 OT Time Calculation (min): 19 min Charges:  OT General Charges $OT Visit: 1 Visit OT Evaluation $OT Eval Low Complexity: 1 Low  Carver Fila, OTD, OTR/L SecureChat Preferred Acute Rehab [phone]   Carver Fila Koonce 09/17/2022, 12:47 PM

## 2022-09-18 DIAGNOSIS — I4891 Unspecified atrial fibrillation: Secondary | ICD-10-CM | POA: Diagnosis not present

## 2022-09-18 DIAGNOSIS — G2581 Restless legs syndrome: Secondary | ICD-10-CM

## 2022-09-18 DIAGNOSIS — R7401 Elevation of levels of liver transaminase levels: Secondary | ICD-10-CM

## 2022-09-18 DIAGNOSIS — N189 Chronic kidney disease, unspecified: Secondary | ICD-10-CM | POA: Diagnosis not present

## 2022-09-18 DIAGNOSIS — I10 Essential (primary) hypertension: Secondary | ICD-10-CM | POA: Diagnosis not present

## 2022-09-18 DIAGNOSIS — Z992 Dependence on renal dialysis: Secondary | ICD-10-CM | POA: Diagnosis not present

## 2022-09-18 DIAGNOSIS — N186 End stage renal disease: Secondary | ICD-10-CM | POA: Diagnosis not present

## 2022-09-18 DIAGNOSIS — I158 Other secondary hypertension: Secondary | ICD-10-CM | POA: Diagnosis not present

## 2022-09-18 DIAGNOSIS — B0052 Herpesviral keratitis: Secondary | ICD-10-CM

## 2022-09-18 LAB — GLUCOSE, CAPILLARY
Glucose-Capillary: 132 mg/dL — ABNORMAL HIGH (ref 70–99)
Glucose-Capillary: 244 mg/dL — ABNORMAL HIGH (ref 70–99)
Glucose-Capillary: 175 mg/dL — ABNORMAL HIGH (ref 70–99)
Glucose-Capillary: 207 mg/dL — ABNORMAL HIGH (ref 70–99)

## 2022-09-18 MED ORDER — POTASSIUM CHLORIDE CRYS ER 20 MEQ PO TBCR: 20.0000 meq | EXTENDED_RELEASE_TABLET | Freq: Once | ORAL | Status: DC

## 2022-09-18 MED ORDER — ORAL CARE MOUTH RINSE: 15.0000 mL | OROMUCOSAL | Status: DC | PRN

## 2022-09-18 MED ORDER — MUSCLE RUB 10-15 % EX CREA
TOPICAL_CREAM | CUTANEOUS | Status: DC | PRN
  Administered 2022-09-20: 1 via TOPICAL
  Filled 2022-09-18: qty 85

## 2022-09-18 MED ORDER — ORAL CARE MOUTH RINSE
15.0000 mL | OROMUCOSAL | Status: DC
  Administered 2022-09-18 – 2022-09-24 (×12): 15 mL via OROMUCOSAL

## 2022-09-18 MED ORDER — MAGNESIUM SULFATE 2 GM/50ML IV SOLN
2.0000 g | Freq: Once | INTRAVENOUS | Status: AC
  Administered 2022-09-19: 2 g via INTRAVENOUS
  Filled 2022-09-18: qty 50

## 2022-09-18 NOTE — Progress Notes (Signed)
[facility] Progress Note   Subjective:   Dialysis completed overnight in room due to dilt gtt which remains on.  UF was 0mL.  She is feeling much improved.  C/o RLS which is a chronic issue for her.   Objective Vitals:   09/18/22 0359 09/18/22 0430 09/18/22 0505 09/18/22 0744  BP: (!) 104/58   114/67  Pulse: 91   100  Resp: 20   20  Temp: 97.9 F (36.6 C)   98.3 F (36.8 C)  TempSrc: Oral   Oral  SpO2: 100% 95%  93%  Weight:   55.7 kg    Physical Exam General: in bed, arousable - much more alert today Heart: a fib rate 80s, no rub Lungs: clear ant Abdomen: soft Extremities: no edema Dialysis Access:  LUE AVF +t/b  Additional Objective Labs: Basic Metabolic Panel: Recent Labs  Lab 09/16/22 0606 09/17/22 0131 09/18/22 0250  NA 136 137 133*  K 3.8 4.1 3.4*  CL 99 97* 94*  CO2 22 24 27   GLUCOSE 139* 120* 172*  BUN 25* 38* 16  CREATININE 2.74* 3.59* 2.13*  CALCIUM 8.0* 7.9* 7.3*  PHOS 6.2* 8.2* 3.9   Liver Function Tests: Recent Labs  Lab 09/15/22 0036 09/16/22 0606 09/17/22 0131 09/18/22 0250  AST 27 23  --  12*  ALT 72* 54*  --  30  ALKPHOS 68 67  --  48  BILITOT 0.7 1.1  --  0.7  PROT 5.7* 5.7*  --  4.8*  ALBUMIN 2.8* 2.6* 2.4* 2.1*   No results for input(s): "LIPASE", "AMYLASE" in the last 168 hours. CBC: Recent Labs  Lab 09/14/22 0506 09/15/22 0036 09/16/22 0606 09/17/22 0131 09/18/22 0250  WBC 9.7 12.8* 13.2* 9.9 8.1  NEUTROABS 8.3* 11.1* 12.1*  --  6.8  HGB 10.5* 10.2* 10.4* 10.5* 9.4*  HCT 34.1* 32.3* 33.8* 32.7* 29.7*  MCV 92.7 91.8 93.1 94.0 91.7  PLT 233 236 230 229 216   Blood Culture    Component Value Date/Time   SDES BLOOD RIGHT ANTECUBITAL 01/22/2022 0038   SPECREQUEST  01/22/2022 0038    BOTTLES DRAWN AEROBIC AND ANAEROBIC Blood Culture results may not be optimal due to an inadequate volume of blood received in culture bottles   CULT  01/22/2022 0038    NO GROWTH 5 DAYS Performed at [facility]  Lab, [address] [place], [place] [address]    REPTSTATUS 01/27/2022 FINAL 01/22/2022 0038    Cardiac Enzymes: No results for input(s): "CKTOTAL", "CKMB", "CKMBINDEX", "TROPONINI" in the last 168 hours. CBG: Recent Labs  Lab 09/17/22 0615 09/17/22 1117 09/17/22 1637 09/17/22 2150 09/18/22 0618  GLUCAP 139* 145* 189* 123* 132*   Iron Studies: No results for input(s): "IRON", "TIBC", "TRANSFERRIN", "FERRITIN" in the last 72 hours. @lablastinr3 @ Studies/Results: No results found. Medications:  diltiazem (CARDIZEM) infusion 7.5 mg/hr (09/17/22 1900)   magnesium sulfate bolus IVPB      amiodarone  100 mg Oral Daily   brinzolamide  1 drop Left Eye BID   And   brimonidine  1 drop Left Eye BID   Chlorhexidine Gluconate Cloth  6 each Topical Q0600   cinacalcet  30 mg Oral TID with meals   erythromycin   Left Eye BID   insulin aspart  0-6 Units Subcutaneous TID WC   lipase/protease/amylase  36,000 Units Oral TID WC   metoprolol tartrate  25 mg Oral BID   prednisoLONE acetate  1 drop Left Eye TID   rOPINIRole  0.25 mg Oral QHS   sevelamer carbonate  1,600 mg Oral TID WC   valACYclovir  500 mg Oral QHS    OP HD: [facility] MWF  3.5h  350/ 1.5  55kg  2/2 bath  AVF   Heparin none - last OP HD 7/26, post wt 57.5kg   - coming up 2-3kg over dry wt for last 3 wks - hectorol 5 mcg IV three times per week - mircera 75 mcg IV q 2 wks, last 7/17, due 7/31     Assessment/ Plan: Afib w/ RVR - per pmd/ cardiology currently getting IV dilt gtt and po amio (was on at home). Metoprolol added, coreg on hold. Not on a/c due to bleeding risk. Per cardiology --> hopefully can start transitioning to an oral regimen to get of dilt gtt.  AMS - likely due to excessive dosing of the valacyclovir at 1 gm tid; the adjustment for esrd patient would be 500 mg daily. Held and rec'd dialysis x 2 then resumed at ESRD dose pm 7/31 as indication was Hsv keratitis vs zoster ophthalmicus  ESRD - on HD MWF, last HD  Friday. Had HD overnight 7/28-29 in part to dialyze acyclovir.  HD completed overnight 7/30-31.  Plan next HD Friday per usual schedule HTN/ volume - euvolemic on exam, ~ at EDW, BP's normal.  Will need to challenge EDW Friday.  Anemia esrd - Hb 10-11 here, next esa due on 7/31. Follow.  MBD ckd - CCa in range, phos is high. Cont binder, renal diet and HD for high phos. Cont IV vdra, sensipar 30 tid.  DM2 - on insulin  Estill Bakes MD 09/18/2022, 9:01 AM  [facility] Pager: [phone]

## 2022-09-18 NOTE — Progress Notes (Signed)
Physical Therapy Treatment Patient Details Name: Kim Burns MRN: [id number] DOB: [birth year] Today's Date: 09/18/2022   History of Present Illness Pt is 87 year old presented to [facility] on  09/13/22 for afib with RVR. Daughter concerned about confusion and difficulty with speech on 7/27. MRI negative. Pt on higher dose of Valtrex than needed for a pt with renal failure and this resulted in confusion, lethargy, jerking etc. PMH - afib, ESRD on HD, chf, DM, HTN, Whipple, renal cell mass    PT Comments  Pt very sleepy and fatigued this date. Pt oriented and able to follow commands but kept eyes closed and presented with decreased activity tolerance. Worked on combing hair in unsupported sitting and then marching in place. Pt limited by fatigue this date. VSS. Anticipate once pt feels better she will be able to progress well enough to transition home.    If plan is discharge home, recommend the following: A little help with walking and/or transfers;A little help with bathing/dressing/bathroom;Assistance with cooking/housework;Help with stairs or ramp for entrance;Assist for transportation   Can travel by private vehicle        Equipment Recommendations  None recommended by PT    Recommendations for Other Services       Precautions / Restrictions Precautions Precautions: Fall;Other (comment) Precaution Comments: very fatigued Restrictions Weight Bearing Restrictions: No     Mobility  Bed Mobility               General bed mobility comments: pt up in chair upon PT arrival    Transfers Overall transfer level: Needs assistance Equipment used: Rolling walker (2 wheels) Transfers: Sit to/from Stand, Bed to chair/wheelchair/BSC Sit to Stand: Mod assist           General transfer comment: Assist to power up and stabilize for sit to stand with walker or with forearm support. increased time, labored effort, limited use of L UE due to pain, increased trunk flexion/dependence on  RW, fatigued, stood x2    Ambulation/Gait               General Gait Details: marched in place x10 with increased time, slow, kept eyes closed   Stairs             Wheelchair Mobility     Tilt Bed    Modified Rankin (Stroke Patients Only)       Balance Overall balance assessment: Needs assistance Sitting-balance support: No upper extremity supported, Feet supported Sitting balance-Leahy Scale: Fair     Standing balance support: Single extremity supported, Bilateral upper extremity supported, During functional activity, Reliant on assistive device for balance Standing balance-Leahy Scale: Poor Standing balance comment: UE support and min assist for static standing                            Cognition Arousal/Alertness: Lethargic (but alert and cooperative) Behavior During Therapy: WFL for tasks assessed/performed Overall Cognitive Status: Impaired/Different from baseline Area of Impairment: Problem solving, Memory, Following commands, Awareness                     Memory: Decreased short-term memory Following Commands: Follows one step commands with increased time, Follows one step commands consistently   Awareness: Emergent Problem Solving: Slow processing, Requires tactile cues, Requires verbal cues General Comments: Pt maintaining eyes closed reporting "Im just so tired". Pt able to follow commands but delayed        Exercises  General Comments General comments (skin integrity, edema, etc.): VSS. Pt with request to use shower cap to wash hair s/p EEG, pt assisted with combing hair with R UE and then changed to L UE s/p 2 min due to report of R UE fatigue. Pt able to sit unsupported x 5 min prior to needing to lay back against chair      Pertinent Vitals/Pain Pain Assessment Pain Assessment: Faces Faces Pain Scale: Hurts little more Pain Location: L UE where fistula was    Home Living                           Prior Function            PT Goals (current goals can now be found in the care plan section) Acute Rehab PT Goals Patient Stated Goal: return home PT Goal Formulation: With patient Time For Goal Achievement: 09/30/22 Potential to Achieve Goals: Good Progress towards PT goals: Progressing toward goals    Frequency    Min 1X/week      PT Plan Current plan remains appropriate    Co-evaluation              AM-PAC PT "6 Clicks" Mobility   Outcome Measure  Help needed turning from your back to your side while in a flat bed without using bedrails?: A Lot Help needed moving from lying on your back to sitting on the side of a flat bed without using bedrails?: A Lot Help needed moving to and from a bed to a chair (including a wheelchair)?: A Lot Help needed standing up from a chair using your arms (e.g., wheelchair or bedside chair)?: A Lot Help needed to walk in hospital room?: Total Help needed climbing 3-5 steps with a railing? : Total 6 Click Score: 10    End of Session Equipment Utilized During Treatment: Oxygen Activity Tolerance: Patient limited by fatigue Patient left: in chair;with call bell/phone within reach;with chair alarm set;with family/visitor present Nurse Communication: Mobility status PT Visit Diagnosis: Other abnormalities of gait and mobility (R26.89);Muscle weakness (generalized) (M62.81);Difficulty in walking, not elsewhere classified (R26.2)     Time: 1610-9604 PT Time Calculation (min) (ACUTE ONLY): 24 min  Charges:    $Therapeutic Exercise: 8-22 mins $Therapeutic Activity: 8-22 mins PT General Charges $$ ACUTE PT VISIT: 1 Visit                     Lewis Shock, PT, DPT Acute Rehabilitation Services Secure chat preferred Office #: [phone]    Iona Hansen 09/18/2022, 1:47 PM

## 2022-09-18 NOTE — TOC Progression Note (Signed)
Transition of Care [facility]) - Progression Note    Patient Details  Name: Kim Burns MRN: [id number] Date of Birth: 1935-08-12  [facility] CM/SW Contact  Harriet Masson, RN Phone Number: 09/18/2022, 10:19 AM  Clinical Narrative:    Therapy recommendations are home health PT, OT.  Patient has used [facility] in the past and would like to use them again.  Kelly with [facility] accepted referral.  TOC following.  Expected Discharge Plan: Home/Self Care Barriers to Discharge: Continued Medical Work up  Expected Discharge Plan and Services       Living arrangements for the past 2 months: Single Family Home                           HH Arranged: OT, PT HH Agency: [facility] Date [facility] Agency Contacted: 09/18/22 Time HH Agency Contacted: 1018 Representative spoke with at [facility] Agency: Tresa Endo   Social Determinants of Health (SDOH) Interventions SDOH Screenings   Food Insecurity: No Food Insecurity (11/01/2021)  Housing: Low Risk  (11/01/2021)  Transportation Needs: No Transportation Needs (11/01/2021)  Utilities: Not At Risk (11/01/2021)  Tobacco Use: Low Risk  (09/14/2022)    Readmission Risk Interventions    08/27/2021   10:16 AM  Readmission Risk Prevention Plan  Transportation Screening Complete  PCP or Specialist Appt within 3-5 Days Complete  HRI or Home Care Consult Complete  Social Work Consult for Recovery Care Planning/Counseling Complete  Palliative Care Screening Not Applicable  Medication Review [employer]) Complete

## 2022-09-18 NOTE — Progress Notes (Signed)
MD ordered Mag IVPB. One IV access with Cardizem running continuously. Consulted IV team for another IV. RN and MD told by IV team, no other access available, possible PICC placement. MD stated he did not want to put a PICC for a one time dose of Mag. MD asked by RN about changing to PO, MD stated "PO will  not work fast enough. I will wait for cardiology to see her and see if they can turn off the Cardizem". Mag held. No new orders.     09/19/2022 update: Cardizem discontinued, IV Magnesium given.

## 2022-09-18 NOTE — Progress Notes (Signed)
Received patient at bedside.  Alert and oriented.  Informed consent signed and in chart.   TX duration: 3 Hours  Patient tolerated well.  Alert, without acute distress.  Hand-off given to patient's nurse.   Access used: LUE AVF Access issues: None  Total UF removed: 0 mL Medication(s) given: None Post HD VS: see data insert Post HD weight: Unable to obtain     09/18/22 0115  Vitals  Temp 97.7 F (36.5 C)  Temp Source Axillary  BP (!) 118/46  MAP (mmHg) 65  BP Location Left Leg  BP Method Automatic  Patient Position (if appropriate) Lying  Pulse Rate 92  Pulse Rate Source Monitor  ECG Heart Rate 90  Resp (!) 21  Oxygen Therapy  SpO2 98 %  O2 Device Nasal Cannula  O2 Flow Rate (L/min) 4 L/min  Patient Activity (if Appropriate) In bed  Pulse Oximetry Type Continuous  Post Treatment  Dialyzer Clearance Lightly streaked  Duration of HD Treatment -hour(s) 3 hour(s)  Hemodialysis Intake (mL) 0 mL  Liters Processed 71.8  Fluid Removed (mL) 0 mL  Tolerated HD Treatment Yes  Post-Hemodialysis Comments Treatment completion delayed d/t increased transmembrane pressure requiring system setup change. Treatment resumed with new setup for remaining time ( 0:30) without further issue.  AVG/AVF Arterial Site Held (minutes) 10 minutes  AVG/AVF Venous Site Held (minutes) 10 minutes  Note  Patient Observations Treatment completed and patient tolerated well, VSS throughout, no acute distress noted. Patient condition stable for this d/c. Gauze applied to LUE AVF cannulation sites and secured with paper tape, hemostasis achieved.  Fistula / Graft Left Upper arm Arteriovenous fistula  Placement Date/Time: 04/24/17 1031   Placed prior to admission: No  Orientation: Left  Access Location: Upper arm  Access Type: (c) Arteriovenous fistula  Site Condition No complications  Fistula / Graft Assessment Present;Thrill;Bruit  Status Deaccessed;Flushed;Patent  Drainage Description None     [facility]

## 2022-09-18 NOTE — Progress Notes (Signed)
IVT consult placed for PIV placement. Patient has a fistula on her left forearm. Current PIV in right hand infusing. Patient has large hematoma/wound on her right forearm. Currently unable to identify suitable location for additional peripheral access. Secure chat sent to Dr. Janee Morn. Primary RN notified. Recommend alternative access options if additional access is needed.   Montrae Braithwaite Loyola Mast, RN

## 2022-09-18 NOTE — Care Management Important Message (Signed)
Important Message  Patient Details  Name: Kim Burns MRN: [id number] Date of Birth: Nov 19, 1935   Medicare Important Message Given:  Yes     [name] Stefan Church 09/18/2022, 2:35 PM

## 2022-09-18 NOTE — Progress Notes (Signed)
PROGRESS NOTE    Kim Burns  WUJ:[id number] DOB: [birth year] DOA: 09/13/2022 PCP: Kim Moccasin, MD    Chief Complaint  Patient presents with   Weakness   Fatigue   Tachycardia    Brief Narrative:  Kim Burns is a 87 y.o. female with medical history significant for paroxysmal atrial fibrillation, end-stage renal disease on hemodialysis on Monday, Wednesday, Friday schedule, type 2 diabetes mellitus, essential hypertension, chronic diastolic heart failure, who is admitted to [facility] on 09/13/2022 with atrial fibrillation with RVR after presenting from home to [facility] ED complaining of palpitations.    She was admitted for atrial fibrillation with RVR.  Hospitalization complicated by AMS related to valtrex in setting of ESRD.  She's improving from mental status standpoint and HR standpoint.   Hopefully can consider discharge in a couple days.    Assessment & Plan:   Principal Problem:   Atrial fibrillation with RVR (HCC) Active Problems:   End-stage renal disease on hemodialysis (HCC)   DM2 (diabetes mellitus, type 2) (HCC)   Chronic diastolic CHF (congestive heart failure) (HCC)   History of anemia due to chronic kidney disease   Essential hypertension   Hypomagnesemia   RLS (restless legs syndrome)   HSV (herpes simplex virus) dendritic keratitis   Transaminitis  #1 A-fib with with RVR -Unclear etiology.  -Chest x-ray with atelectasis versus infiltrates right mid and upper lung. -Patient noted to have converted on amiodarone without recurrence after ablation procedure July 2023 and was on low-dose amiodarone and Coreg prior to admission. -Heart rate improving, currently on diltiazem drip, metoprolol and amiodarone. -Coreg currently on hold and will use metoprolol instead. -It is noted that the patient is not a anticoagulation candidate after discussion with cardiology in the outpatient setting and it was felt that bleeding risk outweighed benefit of  stroke prevention based on shared decision making with cardiology. -If heart rates are difficult to control with diltiazem alone may need to consider IV amiodarone. -Continue IV diltiazem and if heart rate remains controlled in the next 24 hours could likely wean off IV diltiazem. -Replete electrolytes to keep potassium approximately 4, magnesium approximately 2. -Cardiology following and appreciate input and recommendations.  2.  Acute metabolic encephalopathy/myoclonus -Felt likely secondary to inappropriate Valtrex dosing and patient with ESRD. -It is noted that Valtrex can cause myoclonus, delirium, encephalopathy, seizures and likely cause of patient's encephalopathy and myoclonic jerks. -Clinical improvement. -MRI head negative for any acute abnormalities. -EEG done with no seizure-like activity noted but excess low voltage fast activity consistent with medication effect, mild to moderate generalized slowing of background consistent with mild to moderate degree of diffusion or multifocal cerebral dysfunction. -Patient underwent hemodialysis yesterday. -Valtrex restarted at renal dosing of 500 mg daily. -Supportive care.  3.  ESRD on HD/volume overload/bilateral lower extremity edema -Volume overload improved with hemodialysis. -Per nephrology.  4.  Diastolic CHF/moderately reduced RV SF/moderately elevated PASP -2D echo done with EF of 55 to 60%, NWMA, mildly reduced right ventricular systolic function, severely elevated PASP, severely dilated left atrial size, mild to moderate dilated right atrial size, mild MVR, moderate TVR. -On hemodialysis.  5.  Prolonged QTc -Replete electrolytes to keep potassium approximately 4, magnesium approximately 2. -Repeat EKG pending. -Avoid QT prolonging medications.  6.  Hypomagnesemia -Replete.  7.  HSV keratitis vs zoster ophthalmicus -Valtrex redosed renally. -Dr. Lowell Guitar discussed with ophthalmologist, Dr. Sinda Du on 7/30 (office  [phone] )who recommended immediate outpatient follow-up posthospitalization. -Continue  prednisolone acetate ophthalmic suspension, brinzolamide ophthalmic suspension, erythromycin ointment.  Okay with stopping neomycin polymyxin B dexamethasone ophthalmic suspension.  8.  Diabetes mellitus type 2, well-controlled -Hemoglobin A1c 5.8 (09/14/2022) -CBG of 132 this morning. -Diet controlled as per med rec patient not on any diabetic medications prior to admission. -SSI.  9.  Hypertension -Noted to be on Coreg prior to admission however uses midodrine on HD days. -Coreg on hold and currently substituted for metoprolol.  10.  Anemia of chronic disease -Follow H&H.  11.  RLS -Requip.  12.  Transaminitis -Resolved.   DVT prophylaxis: SCDs Code Status: Full Family Communication: Updated patient and daughter at bedside. Disposition: TBD  Status is: Inpatient Remains inpatient appropriate because: Severity of of illness   Consultants:  Cardiology: Dr. Jacques Navy 09/16/2022 Nephrology: Dr. Malachi Bonds 09/15/2022  Procedures:  Chest x-ray 09/14/2022 MRI brain 09/14/2022 2D echo 09/15/2022 EEG 09/15/2022   Antimicrobials:  Anti-infectives (From admission, onward)    Start     Dose/Rate Route Frequency Ordered Stop   09/17/22 2200  valACYclovir (VALTREX) tablet 500 mg       Note to Pharmacy: Start after dialysis 7/30   500 mg Oral Daily at bedtime 09/17/22 1946     09/14/22 2200  valACYclovir (VALTREX) tablet 1,000 mg  Status:  Discontinued        1,000 mg Oral 3 times daily 09/14/22 1716 09/15/22 0956         Subjective: Sitting up in chair, daughter at bedside.  Denies any significant palpitations today like she did on admission.  Denies any significant shortness of breath.  Complaining of fatigue and feeling overall weak.  States jerking motions have improved.  Alert and oriented to self place and time.  Objective: Vitals:   09/18/22 0926 09/18/22 1105 09/18/22 1531 09/18/22  2020  BP:  114/74 123/69 (!) 152/49  Pulse: (!) 109 91 80 88  Resp:  19 17 20   Temp:  97.6 F (36.4 C) 98.6 F (37 C) 98.2 F (36.8 C)  TempSrc:  Oral Axillary Oral  SpO2:  93% 97% 95%  Weight:        Intake/Output Summary (Last 24 hours) at 09/18/2022 2035 Last data filed at 09/18/2022 1855 Gross per 24 hour  Intake 359.04 ml  Output 0 ml  Net 359.04 ml   Filed Weights   09/16/22 0315 09/17/22 0300 09/18/22 0505  Weight: 55.4 kg 54.6 kg 55.7 kg    Examination:  General exam: Appears calm and comfortable  Respiratory system: Clear to auscultation.  No wheezes, no crackles, no rhonchi.  Fair air movement.  Speaking in full sentences.  Respiratory effort normal. Cardiovascular system: Irregularly irregular.  No JVD, no murmurs rubs or gallops.  No pitting lower extremity edema.  Gastrointestinal system: Abdomen is nondistended, soft and nontender. No organomegaly or masses felt. Normal bowel sounds heard.  Left-sided percutaneous nephrostomy tube with yellow clear drainage. Central nervous system: Alert and oriented. No focal neurological deficits. Extremities: Symmetric 5 x 5 power. Skin: No rashes, lesions or ulcers Psychiatry: Judgement and insight appear normal. Mood & affect appropriate.     Data Reviewed: I have personally reviewed following labs and imaging studies  CBC: Recent Labs  Lab 09/14/22 0506 09/15/22 0036 09/16/22 0606 09/17/22 0131 09/18/22 0250  WBC 9.7 12.8* 13.2* 9.9 8.1  NEUTROABS 8.3* 11.1* 12.1*  --  6.8  HGB 10.5* 10.2* 10.4* 10.5* 9.4*  HCT 34.1* 32.3* 33.8* 32.7* 29.7*  MCV 92.7 91.8 93.1 94.0 91.7  PLT 233 236 230 229 216    Basic Metabolic Panel: Recent Labs  Lab 09/13/22 2342 09/14/22 0506 09/15/22 0036 09/16/22 0606 09/17/22 0131 09/18/22 0250  NA 136 135 131* 136 137 133*  K 3.6 3.7 4.2 3.8 4.1 3.4*  CL 95* 95* 94* 99 97* 94*  CO2 26 24 22 22 24 27   GLUCOSE 167* 175* 123* 139* 120* 172*  BUN 34* 38* 48* 25* 38* 16   CREATININE 3.30* 3.40* 4.03* 2.74* 3.59* 2.13*  CALCIUM 7.9* 7.6* 7.5* 8.0* 7.9* 7.3*  MG 2.4 2.2 2.2 1.8  --  1.6*  PHOS  --   --  9.0* 6.2* 8.2* 3.9    GFR: Estimated Creatinine Clearance: 15.3 mL/min (A) (by C-G formula based on SCr of 2.13 mg/dL (H)).  Liver Function Tests: Recent Labs  Lab 09/14/22 0506 09/15/22 0036 09/16/22 0606 09/17/22 0131 09/18/22 0250  AST 51* 27 23  --  12*  ALT 90* 72* 54*  --  30  ALKPHOS 72 68 67  --  48  BILITOT 0.4 0.7 1.1  --  0.7  PROT 5.8* 5.7* 5.7*  --  4.8*  ALBUMIN 2.7* 2.8* 2.6* 2.4* 2.1*    CBG: Recent Labs  Lab 09/17/22 1637 09/17/22 2150 09/18/22 0618 09/18/22 1210 09/18/22 1619  GLUCAP 189* 123* 132* 244* 175*     No results found for this or any previous visit (from the past 240 hour(s)).       Radiology Studies: No results found.      Scheduled Meds:  amiodarone  100 mg Oral Daily   brinzolamide  1 drop Left Eye BID   And   brimonidine  1 drop Left Eye BID   Chlorhexidine Gluconate Cloth  6 each Topical Q0600   cinacalcet  30 mg Oral TID with meals   erythromycin   Left Eye BID   insulin aspart  0-6 Units Subcutaneous TID WC   lipase/protease/amylase  36,000 Units Oral TID WC   metoprolol tartrate  25 mg Oral BID   mouth rinse  15 mL Mouth Rinse 4 times per day   prednisoLONE acetate  1 drop Left Eye TID   rOPINIRole  0.25 mg Oral QHS   sevelamer carbonate  1,600 mg Oral TID WC   valACYclovir  500 mg Oral QHS   Continuous Infusions:  diltiazem (CARDIZEM) infusion 7.5 mg/hr (09/18/22 1855)   magnesium sulfate bolus IVPB       LOS: 3 days    Time spent: 40 mins    Ramiro Harvest, MD [facility]   To contact the attending provider between 7A-7P or the covering provider during after hours 7P-7A, please log into the web site [email or web address] and access using universal [place] password for that web site. If you do not have the password, please call the hospital  operator.  09/18/2022, 8:35 PM

## 2022-09-18 NOTE — Progress Notes (Signed)
Mobility Specialist Progress Note:   09/18/22 1600  Mobility  Activity Stood at bedside;Transferred from bed to chair (Marched in place)  Level of Assistance +2 (takes two people)  Location manager Ambulated (ft) 4 ft  Activity Response Tolerated well  Mobility Referral Yes  $Mobility charge 1 Mobility  Mobility Specialist Start Time (ACUTE ONLY) 1550  Mobility Specialist Stop Time (ACUTE ONLY) 1610  Mobility Specialist Time Calculation (min) (ACUTE ONLY) 20 min    Pre Mobility: 81 HR,  123/69 (79) BP During Mobility: 85 HR Post Mobility:  83 HR  Pt received in bed, agreeable to mobility. ModA+2 for STS and ambulation. Performed x2 sets of STS with 10 reps of marching in place. Asymptomatic throughout, but a little anxious. Pt left in chair with call bell and family present.  D'Vante Earlene Plater Mobility Specialist Please contact via Special educational needs teacher or Rehab office at [phone]

## 2022-09-19 DIAGNOSIS — N189 Chronic kidney disease, unspecified: Secondary | ICD-10-CM | POA: Diagnosis not present

## 2022-09-19 DIAGNOSIS — N186 End stage renal disease: Secondary | ICD-10-CM | POA: Diagnosis not present

## 2022-09-19 DIAGNOSIS — I10 Essential (primary) hypertension: Secondary | ICD-10-CM | POA: Diagnosis not present

## 2022-09-19 DIAGNOSIS — I4891 Unspecified atrial fibrillation: Secondary | ICD-10-CM | POA: Diagnosis not present

## 2022-09-19 LAB — RENAL FUNCTION PANEL
Albumin: 2.3 g/dL — ABNORMAL LOW (ref 3.5–5.0)
Anion gap: 14 (ref 5–15)
BUN: 30 mg/dL — ABNORMAL HIGH (ref 8–23)
CO2: 22 mmol/L (ref 22–32)
Calcium: 6.9 mg/dL — ABNORMAL LOW (ref 8.9–10.3)
Chloride: 92 mmol/L — ABNORMAL LOW (ref 98–111)
Creatinine, Ser: 3.27 mg/dL — ABNORMAL HIGH (ref 0.44–1.00)
GFR, Estimated: 13 mL/min — ABNORMAL LOW (ref >60)
Glucose, Bld: 154 mg/dL — ABNORMAL HIGH (ref 70–99)
Phosphorus: 4.4 mg/dL (ref 2.5–4.6)
Potassium: 4.1 mmol/L (ref 3.5–5.1)
Sodium: 128 mmol/L — ABNORMAL LOW (ref 135–145)

## 2022-09-19 LAB — GLUCOSE, CAPILLARY
Glucose-Capillary: 119 mg/dL — ABNORMAL HIGH (ref 70–99)
Glucose-Capillary: 178 mg/dL — ABNORMAL HIGH (ref 70–99)
Glucose-Capillary: 122 mg/dL — ABNORMAL HIGH (ref 70–99)
Glucose-Capillary: 175 mg/dL — ABNORMAL HIGH (ref 70–99)

## 2022-09-19 MED ORDER — METOPROLOL TARTRATE 25 MG PO TABS
25.0000 mg | ORAL_TABLET | Freq: Once | ORAL | Status: AC
  Administered 2022-09-19: 25 mg via ORAL
  Filled 2022-09-19: qty 1

## 2022-09-19 MED ORDER — METOPROLOL TARTRATE 50 MG PO TABS
50.0000 mg | ORAL_TABLET | Freq: Two times a day (BID) | ORAL | Status: DC
  Administered 2022-09-19 – 2022-09-24 (×10): 50 mg via ORAL
  Filled 2022-09-19 (×10): qty 1

## 2022-09-19 MED ORDER — DICLOFENAC SODIUM 1 % EX GEL
2.0000 g | Freq: Three times a day (TID) | CUTANEOUS | Status: DC | PRN
  Administered 2022-09-20 – 2022-09-22 (×3): 2 g via TOPICAL
  Filled 2022-09-19: qty 100

## 2022-09-19 MED ORDER — ROPINIROLE HCL 0.25 MG PO TABS
0.2500 mg | ORAL_TABLET | Freq: Two times a day (BID) | ORAL | Status: DC
  Administered 2022-09-19 – 2022-09-24 (×11): 0.25 mg via ORAL
  Filled 2022-09-19 (×13): qty 1

## 2022-09-19 NOTE — Progress Notes (Signed)
Unwrap right arm dressing as per pt request. Elevated and placed Ice Pack. positive radial pulse, sensory and motor intact noted on right  arm.

## 2022-09-19 NOTE — Progress Notes (Signed)
PROGRESS NOTE    Kim Burns  XBM:[id number] DOB: [birth year] DOA: 09/13/2022 PCP: Lewis Moccasin, MD    Chief Complaint  Patient presents with   Weakness   Fatigue   Tachycardia    Brief Narrative:  Kim Burns is a 87 y.o. female with medical history significant for paroxysmal atrial fibrillation, end-stage renal disease on hemodialysis on Monday, Wednesday, Friday schedule, type 2 diabetes mellitus, essential hypertension, chronic diastolic heart failure, who is admitted to [facility] on 09/13/2022 with atrial fibrillation with RVR after presenting from home to [facility] ED complaining of palpitations.    She was admitted for atrial fibrillation with RVR.  Hospitalization complicated by AMS related to valtrex in setting of ESRD.  She's improving from mental status standpoint and HR standpoint.   Hopefully can consider discharge in a couple days.    Assessment & Plan:   Principal Problem:   Atrial fibrillation with RVR (HCC) Active Problems:   End-stage renal disease on hemodialysis (HCC)   DM2 (diabetes mellitus, type 2) (HCC)   Chronic diastolic CHF (congestive heart failure) (HCC)   History of anemia due to chronic kidney disease   Essential hypertension   Hypomagnesemia   RLS (restless legs syndrome)   HSV (herpes simplex virus) dendritic keratitis   Transaminitis  #1 A-fib with with RVR -Unclear etiology.  -Chest x-ray with atelectasis versus infiltrates right mid and upper lung. -Patient noted to have converted on amiodarone without recurrence after ablation procedure July 2023 and was on low-dose amiodarone and Coreg prior to admission. -Heart rate improving, currently on diltiazem drip, metoprolol and amiodarone. -Coreg currently on hold and will use metoprolol instead. -It is noted that the patient is not a anticoagulation candidate after discussion with cardiology in the outpatient setting and it was felt that bleeding risk outweighed benefit of  stroke prevention based on shared decision making with cardiology. -If heart rates are difficult to control with diltiazem alone may need to consider IV amiodarone. -Heart rates currently controlled on IV diltiazem, metoprolol, amiodarone. -Increase metoprolol to 50 mg p.o. twice daily. -Continue amiodarone. -Discontinue Cardizem drip and monitor heart rate. -Replete electrolytes to keep potassium approximately 4, magnesium approximately 2. -Cardiology following and appreciate input and recommendations.  2.  Acute metabolic encephalopathy/myoclonus -Felt likely secondary to inappropriate Valtrex dosing and patient with ESRD. -It is noted that Valtrex can cause myoclonus, delirium, encephalopathy, seizures and likely cause of patient's encephalopathy and myoclonic jerks. -Improving daily, clinically. -MRI head negative for any acute abnormalities. -EEG done with no seizure-like activity noted but excess low voltage fast activity consistent with medication effect, mild to moderate generalized slowing of background consistent with mild to moderate degree of diffusion or multifocal cerebral dysfunction. -Patient underwent hemodialysis yesterday. -Valtrex restarted at renal dosing of 500 mg daily. -Supportive care.  3.  ESRD on HD/volume overload/bilateral lower extremity edema -Volume overload being managed with hemodialysis.   -Per nephrology.    4.  Diastolic CHF/moderately reduced RV SF/moderately elevated PASP -2D echo done with EF of 55 to 60%, NWMA, mildly reduced right ventricular systolic function, severely elevated PASP, severely dilated left atrial size, mild to moderate dilated right atrial size, mild MVR, moderate TVR. -On hemodialysis.  5.  Prolonged QTc -Replete electrolytes to keep potassium approximately 4, magnesium approximately 2. -Repeat EKG with resolution of QTc prolongation.   -Avoid QT prolonging medications.  6.  Hypomagnesemia -Magnesium at 1.9 this morning.    -IV magnesium running. -Follow.   7.  HSV keratitis vs zoster ophthalmicus -Valtrex redosed renally. -Dr. Lowell Guitar discussed with ophthalmologist, Dr. Sinda Du on 7/30 (office [phone] )who recommended immediate outpatient follow-up posthospitalization. -Continue prednisolone acetate ophthalmic suspension, brinzolamide ophthalmic suspension, erythromycin ointment.  Okay with stopping neomycin polymyxin B dexamethasone ophthalmic suspension.  8.  Diabetes mellitus type 2, well-controlled -Hemoglobin A1c 5.8 (09/14/2022) -CBG of 119 this morning. -Diet controlled as per med rec patient not on any diabetic medications prior to admission. -SSI.  9.  Hypertension -Noted to be on Coreg prior to admission however uses midodrine on HD days. -Coreg on hold and currently substituted for metoprolol.  10.  Anemia of chronic disease -Follow H&H.  11.  RLS -Requip dose increased per nephrology today..  12.  Transaminitis -Resolved.   DVT prophylaxis: SCDs Code Status: Full Family Communication: Updated patient, no family at bedside.  Disposition: TBD  Status is: Inpatient Remains inpatient appropriate because: Severity of of illness   Consultants:  Cardiology: Dr. Jacques Navy 09/16/2022 Nephrology: Dr. Malachi Bonds 09/15/2022  Procedures:  Chest x-ray 09/14/2022 MRI brain 09/14/2022 2D echo 09/15/2022 EEG 09/15/2022   Antimicrobials:  Anti-infectives (From admission, onward)    Start     Dose/Rate Route Frequency Ordered Stop   09/17/22 2200  valACYclovir (VALTREX) tablet 500 mg       Note to Pharmacy: Start after dialysis 7/30   500 mg Oral Daily at bedtime 09/17/22 1946     09/14/22 2200  valACYclovir (VALTREX) tablet 1,000 mg  Status:  Discontinued        1,000 mg Oral 3 times daily 09/14/22 1716 09/15/22 0956         Subjective: Patient laying in bed sleeping.  Easily arousable.  Denies any chest pain.  Denies any palpitations.  Denies any significant shortness of breath.   Complains of restless legs overnight.  Also complaining of pain in the right hand around the IV site.  Alert and oriented.  Myoclonic jerks improved.  Still with complaints of fatigue and weakness.    Objective: Vitals:   09/19/22 0300 09/19/22 0339 09/19/22 0758 09/19/22 0956  BP: (!) 108/56  (!) 148/49 138/68  Pulse: 79  81 68  Resp: 14  16   Temp: 98 F (36.7 C)  98.2 F (36.8 C)   TempSrc: Oral  Oral   SpO2: 97%  98%   Weight:  57 kg      Intake/Output Summary (Last 24 hours) at 09/19/2022 1100 Last data filed at 09/19/2022 0334 Gross per 24 hour  Intake 317.09 ml  Output 100 ml  Net 217.09 ml   Filed Weights   09/17/22 0300 09/18/22 0505 09/19/22 0339  Weight: 54.6 kg 55.7 kg 57 kg    Examination:  General exam: Appears calm and comfortable  Respiratory system: Clear to auscultation.  No wheezes, no crackles, no rhonchi.  Fair air movement.  Speaking in full sentences.  Cardiovascular system: Irregularly irregular.  No JVD, no murmurs rubs or gallops.  No pitting lower extremity edema.  Gastrointestinal system: Abdomen is soft, nontender, nondistended, positive bowel sounds.  Left sided percutaneous nephrostomy tube intact. Central nervous system: Alert and oriented. No focal neurological deficits. Extremities: Right hand with some erythema around IV site, slight induration, exquisitely tender to palpation.  Symmetric 5 x 5 power. Skin: No rashes, lesions or ulcers Psychiatry: Judgement and insight appear normal. Mood & affect appropriate.     Data Reviewed: I have personally reviewed following labs and imaging studies  CBC: Recent Labs  Lab 09/14/22 0506 09/15/22 0036 09/16/22 0606 09/17/22 0131 09/18/22 0250 09/19/22 0302  WBC 9.7 12.8* 13.2* 9.9 8.1 8.0  NEUTROABS 8.3* 11.1* 12.1*  --  6.8  --   HGB 10.5* 10.2* 10.4* 10.5* 9.4* 10.0*  HCT 34.1* 32.3* 33.8* 32.7* 29.7* 31.0*  MCV 92.7 91.8 93.1 94.0 91.7 93.9  PLT 233 236 230 229 216 228    Basic  Metabolic Panel: Recent Labs  Lab 09/14/22 0506 09/15/22 0036 09/16/22 0606 09/17/22 0131 09/18/22 0250 09/19/22 0302  NA 135 131* 136 137 133* 128*  K 3.7 4.2 3.8 4.1 3.4* 4.1  CL 95* 94* 99 97* 94* 92*  CO2 24 22 22 24 27 22   GLUCOSE 175* 123* 139* 120* 172* 154*  BUN 38* 48* 25* 38* 16 30*  CREATININE 3.40* 4.03* 2.74* 3.59* 2.13* 3.27*  CALCIUM 7.6* 7.5* 8.0* 7.9* 7.3* 6.9*  MG 2.2 2.2 1.8  --  1.6* 1.9  PHOS  --  9.0* 6.2* 8.2* 3.9 4.4    GFR: Estimated Creatinine Clearance: 9.9 mL/min (A) (by C-G formula based on SCr of 3.27 mg/dL (H)).  Liver Function Tests: Recent Labs  Lab 09/14/22 0506 09/15/22 0036 09/16/22 0606 09/17/22 0131 09/18/22 0250 09/19/22 0302  AST 51* 27 23  --  12*  --   ALT 90* 72* 54*  --  30  --   ALKPHOS 72 68 67  --  48  --   BILITOT 0.4 0.7 1.1  --  0.7  --   PROT 5.8* 5.7* 5.7*  --  4.8*  --   ALBUMIN 2.7* 2.8* 2.6* 2.4* 2.1* 2.3*    CBG: Recent Labs  Lab 09/18/22 0618 09/18/22 1210 09/18/22 1619 09/18/22 2116 09/19/22 0556  GLUCAP 132* 244* 175* 207* 119*     No results found for this or any previous visit (from the past 240 hour(s)).       Radiology Studies: No results found.      Scheduled Meds:  amiodarone  100 mg Oral Daily   brinzolamide  1 drop Left Eye BID   And   brimonidine  1 drop Left Eye BID   Chlorhexidine Gluconate Cloth  6 each Topical Q0600   cinacalcet  30 mg Oral TID with meals   erythromycin   Left Eye BID   insulin aspart  0-6 Units Subcutaneous TID WC   lipase/protease/amylase  36,000 Units Oral TID WC   metoprolol tartrate  25 mg Oral Once   metoprolol tartrate  50 mg Oral BID   mouth rinse  15 mL Mouth Rinse 4 times per day   prednisoLONE acetate  1 drop Left Eye TID   rOPINIRole  0.25 mg Oral BID   sevelamer carbonate  1,600 mg Oral TID WC   valACYclovir  500 mg Oral QHS   Continuous Infusions:  magnesium sulfate bolus IVPB 2 g (09/19/22 1036)     LOS: 4 days    Time  spent: 40 mins    Ramiro Harvest, MD [facility]   To contact the attending provider between 7A-7P or the covering provider during after hours 7P-7A, please log into the web site [email or web address] and access using universal [place] password for that web site. If you do not have the password, please call the hospital operator.  09/19/2022, 11:00 AM

## 2022-09-19 NOTE — Progress Notes (Addendum)
[facility] Progress Note   Subjective:   Remain on diltiazem gtt.   She is feeling much improved.  C/o RLS which is a chronic issue for her and requesting med increase.    Objective Vitals:   09/18/22 2355 09/19/22 0300 09/19/22 0339 09/19/22 0758  BP: 120/63 (!) 108/56  (!) 148/49  Pulse: 75 79  81  Resp: 15 14  16   Temp: 98.1 F (36.7 C) 98 F (36.7 C)  98.2 F (36.8 C)  TempSrc: Oral Oral  Oral  SpO2: 98% 97%  98%  Weight:   57 kg    Physical Exam General: up in chair eating breakfast Heart: a fib rate 80s, no rub Lungs: clear ant Abdomen: soft Extremities: no edema Dialysis Access:  LUE AVF +t/b  Additional Objective Labs: Basic Metabolic Panel: Recent Labs  Lab 09/17/22 0131 09/18/22 0250 09/19/22 0302  NA 137 133* 128*  K 4.1 3.4* 4.1  CL 97* 94* 92*  CO2 24 27 22   GLUCOSE 120* 172* 154*  BUN 38* 16 30*  CREATININE 3.59* 2.13* 3.27*  CALCIUM 7.9* 7.3* 6.9*  PHOS 8.2* 3.9 4.4   Liver Function Tests: Recent Labs  Lab 09/15/22 0036 09/16/22 0606 09/17/22 0131 09/18/22 0250 09/19/22 0302  AST 27 23  --  12*  --   ALT 72* 54*  --  30  --   ALKPHOS 68 67  --  48  --   BILITOT 0.7 1.1  --  0.7  --   PROT 5.7* 5.7*  --  4.8*  --   ALBUMIN 2.8* 2.6* 2.4* 2.1* 2.3*   No results for input(s): "LIPASE", "AMYLASE" in the last 168 hours. CBC: Recent Labs  Lab 09/15/22 0036 09/16/22 0606 09/17/22 0131 09/18/22 0250 09/19/22 0302  WBC 12.8* 13.2* 9.9 8.1 8.0  NEUTROABS 11.1* 12.1*  --  6.8  --   HGB 10.2* 10.4* 10.5* 9.4* 10.0*  HCT 32.3* 33.8* 32.7* 29.7* 31.0*  MCV 91.8 93.1 94.0 91.7 93.9  PLT 236 230 229 216 228   Blood Culture    Component Value Date/Time   SDES BLOOD RIGHT ANTECUBITAL 01/22/2022 0038   SPECREQUEST  01/22/2022 0038    BOTTLES DRAWN AEROBIC AND ANAEROBIC Blood Culture results may not be optimal due to an inadequate volume of blood received in culture bottles   CULT  01/22/2022 0038    NO GROWTH 5  DAYS Performed at [facility], [address] [place], [place] [address]    REPTSTATUS 01/27/2022 FINAL 01/22/2022 0038    Cardiac Enzymes: No results for input(s): "CKTOTAL", "CKMB", "CKMBINDEX", "TROPONINI" in the last 168 hours. CBG: Recent Labs  Lab 09/18/22 0618 09/18/22 1210 09/18/22 1619 09/18/22 2116 09/19/22 0556  GLUCAP 132* 244* 175* 207* 119*   Iron Studies: No results for input(s): "IRON", "TIBC", "TRANSFERRIN", "FERRITIN" in the last 72 hours. @lablastinr3 @ Studies/Results: No results found. Medications:  diltiazem (CARDIZEM) infusion 7.5 mg/hr (09/19/22 0334)   magnesium sulfate bolus IVPB      amiodarone  100 mg Oral Daily   brinzolamide  1 drop Left Eye BID   And   brimonidine  1 drop Left Eye BID   Chlorhexidine Gluconate Cloth  6 each Topical Q0600   cinacalcet  30 mg Oral TID with meals   erythromycin   Left Eye BID   insulin aspart  0-6 Units Subcutaneous TID WC   lipase/protease/amylase  36,000 Units Oral TID WC   metoprolol tartrate  25 mg  Oral BID   mouth rinse  15 mL Mouth Rinse 4 times per day   prednisoLONE acetate  1 drop Left Eye TID   rOPINIRole  0.25 mg Oral BID   sevelamer carbonate  1,600 mg Oral TID WC   valACYclovir  500 mg Oral QHS    OP HD: NW MWF  3.5h  350/ 1.5  55kg  2/2 bath  AVF   Heparin none - last OP HD 7/26, post wt 57.5kg   - coming up 2-3kg over dry wt for last 3 wks - hectorol 5 mcg IV three times per week - mircera 75 mcg IV q 2 wks, last 7/17, due 7/31     Assessment/ Plan: Afib w/ RVR - per pmd/ cardiology currently getting IV dilt gtt and po amio (was on at home). Metoprolol added, coreg on hold. Not on a/c due to bleeding risk. Per cardiology --> hopefully can start transitioning to an oral regimen to get of dilt gtt.  AMS - likely due to excessive dosing of the valacyclovir at 1 gm tid; the adjustment for esrd patient would be 500 mg daily. Held and rec'd dialysis x 2 then resumed at ESRD dose pm 7/31  as indication was Hsv keratitis vs zoster ophthalmicus  ESRD - on HD MWF, last HD Friday. Had HD overnight 7/28-29 in part to dialyze acyclovir.  HD completed overnight 7/30-31.  Plan next HD Friday per usual schedule HTN/ volume - euvolemic on exam, ~ at EDW, BP's normal.  Will need to challenge EDW Friday.  Anemia esrd - Hb 10-11 here, next esa due on 7/31. Follow.  MBD ckd - CCa in range, phos is high. Cont binder, renal diet and HD for high phos. Cont IV vdra, sensipar 30 tid.  DM2 - on insulin 8. RLS - increased ropinirole to BID from at bedtime.   Estill Bakes MD 09/19/2022, 9:50 AM   [facility] Pager: [phone]

## 2022-09-19 NOTE — Progress Notes (Signed)
Speech Language Pathology Treatment: Dysphagia  Patient Details Name: Kim Burns MRN: [id number] DOB: [birth year] Today's Date: 09/19/2022 Time: 8416-6063 SLP Time Calculation (min) (ACUTE ONLY): 8 min  Assessment / Plan / Recommendation Clinical Impression  Goal of session to determine upgrade from Dys 3. Pt states she doesn't like the food and wasn't able to order spaghetti last night. She states she eats slowly and takes small bites. Pt's mentation appears improved from documentation of initial assessment. She was independent with trials of regular texture, masticated timely without oral residue. Thin liquid continues to be safe without signs of airway compromise. SLP upgraded texture to regular, continue thin liquids and pills with thin liquids. No further ST needed at this time.    HPI HPI: Pt is 87 year old presented to [facility] on  09/13/22 for afib with RVR. Daughter concerned about confusion and difficulty with speech on 7/27. MRI negative. CXR 09/14/22 "Atelectasis or infiltrates in the right mid/upper lung." PMH - afib, ESRD on HD, CHF, TIA, DM, HTN, Whipple.      SLP Plan  All goals met;Discharge SLP treatment due to (comment)      Recommendations for follow up therapy are one component of a multi-disciplinary discharge planning process, led by the attending physician.  Recommendations may be updated based on patient status, additional functional criteria and insurance authorization.    Recommendations  Diet recommendations: Regular;Thin liquid Liquids provided via: Straw Medication Administration: Whole meds with liquid Supervision: Patient able to self feed Compensations: Slow rate;Small sips/bites Postural Changes and/or Swallow Maneuvers: Seated upright 90 degrees                  Oral care BID   None Dysphagia, unspecified (R13.10)     All goals met;Discharge SLP treatment due to (comment)     Royce Macadamia  09/19/2022, 9:36 AM

## 2022-09-19 NOTE — Progress Notes (Signed)
Occupational Therapy Treatment Patient Details Name: Kim Burns MRN: [id number] DOB: 08/04/1935 Today's Date: 09/19/2022   History of present illness Pt is 87 year old presented to [facility] on  09/13/22 for afib with RVR. Daughter concerned about confusion and difficulty with speech on 7/27. MRI negative. Pt on higher dose of Valtrex than needed for a pt with renal failure and this resulted in confusion, lethargy, jerking etc. PMH - afib, ESRD on HD, chf, DM, HTN, Whipple, renal cell mass   OT comments  Patient with complaints of RUE pain and limited use to prevent further pain. Patient was mod assist to get to EOB with assistance for trunk. One person HHA to transfer to recliner due to RUE pain with use and LUE used only for transfers. Patient performed grooming tasks seated in recliner and required setup for breakfast due to difficulty opening containers. Discharge recommendations continue to be appropriate. Acute OT to continue to follow.    Recommendations for follow up therapy are one component of a multi-disciplinary discharge planning process, led by the attending physician.  Recommendations may be updated based on patient status, additional functional criteria and insurance authorization.    Assistance Recommended at Discharge Intermittent Supervision/Assistance  Patient can return home with the following  Assistance with cooking/housework;Direct supervision/assist for medications management;Direct supervision/assist for financial management;Assist for transportation;Help with stairs or ramp for entrance;A lot of help with bathing/dressing/bathroom;A lot of help with walking and/or transfers   Equipment Recommendations  BSC/3in1    Recommendations for Other Services      Precautions / Restrictions Precautions Precautions: Fall;Other (comment) Restrictions Weight Bearing Restrictions: No       Mobility Bed Mobility Overal bed mobility: Needs Assistance Bed Mobility: Supine to  Sit     Supine to sit: Mod assist     General bed mobility comments: required assistance for trunk and scooting to EOB due to unable to use RUE to assist    Transfers Overall transfer level: Needs assistance Equipment used: 1 person hand held assist Transfers: Sit to/from Stand, Bed to chair/wheelchair/BSC Sit to Stand: Mod assist     Step pivot transfers: Mod assist     General transfer comment: mod assist to stand and for transfer due to pain at RUE and using LUE only     Balance Overall balance assessment: Needs assistance Sitting-balance support: No upper extremity supported, Feet supported Sitting balance-Leahy Scale: Fair     Standing balance support: Single extremity supported, During functional activity Standing balance-Leahy Scale: Poor Standing balance comment: stood for transfer with mod assist and LUE support only                           ADL either performed or assessed with clinical judgement   ADL Overall ADL's : Needs assistance/impaired Eating/Feeding: Set up;Sitting Eating/Feeding Details (indicate cue type and reason): up in recliner, required assistance to open containers Grooming: Set up;Sitting                   Toilet Transfer: Minimal assistance Toilet Transfer Details (indicate cue type and reason): simulated to recliner with stand pivot transfer due to painful RUE           General ADL Comments: limited due to RUE pain    Extremity/Trunk Assessment              Vision       Perception     Praxis  Cognition Arousal/Alertness: Awake/alert Behavior During Therapy: WFL for tasks assessed/performed Overall Cognitive Status: Impaired/Different from baseline Area of Impairment: Problem solving, Memory, Following commands, Awareness                 Orientation Level: Disoriented to, Time, Situation   Memory: Decreased short-term memory Following Commands: Follows one step commands with increased  time, Follows one step commands consistently   Awareness: Emergent Problem Solving: Slow processing, Requires tactile cues, Requires verbal cues General Comments: following commands with increased time        Exercises      Shoulder Instructions       General Comments      Pertinent Vitals/ Pain       Pain Assessment Pain Assessment: Faces Faces Pain Scale: Hurts little more Pain Location: RUE upper arm Pain Descriptors / Indicators: Grimacing, Guarding, Sore Pain Intervention(s): Limited activity within patient's tolerance, Monitored during session, Repositioned  Home Living                                          Prior Functioning/Environment              Frequency  Min 1X/week        Progress Toward Goals  OT Goals(current goals can now be found in the care plan section)  Progress towards OT goals: Progressing toward goals  Acute Rehab OT Goals Patient Stated Goal: get better OT Goal Formulation: With patient Time For Goal Achievement: 10/01/22 Potential to Achieve Goals: Good ADL Goals Pt Will Perform Upper Body Dressing: with supervision;sitting Pt Will Perform Lower Body Dressing: with supervision;sitting/lateral leans;sit to/from stand Pt Will Transfer to Toilet: with supervision;ambulating;regular height toilet Additional ADL Goal #1: pt will perform bed mobility with supervision in prep for ADLs  Plan Discharge plan remains appropriate    Co-evaluation                 AM-PAC OT "6 Clicks" Daily Activity     Outcome Measure   Help from another person eating meals?: A Little Help from another person taking care of personal grooming?: A Little Help from another person toileting, which includes using toliet, bedpan, or urinal?: A Little Help from another person bathing (including washing, rinsing, drying)?: A Lot Help from another person to put on and taking off regular upper body clothing?: A Little Help from another  person to put on and taking off regular lower body clothing?: A Lot 6 Click Score: 16    End of Session    OT Visit Diagnosis: Unsteadiness on feet (R26.81);Other abnormalities of gait and mobility (R26.89);Muscle weakness (generalized) (M62.81)   Activity Tolerance Patient tolerated treatment well   Patient Left in chair;with call bell/phone within reach;with chair alarm set   Nurse Communication Mobility status        Time: [phone] OT Time Calculation (min): 20 min  Charges: OT General Charges $OT Visit: 1 Visit OT Treatments $Self Care/Home Management : 8-22 mins  Alfonse Flavors, OTA Acute Rehabilitation Services  Office [phone]   Dewain Penning 09/19/2022, 10:07 AM

## 2022-09-20 DIAGNOSIS — I4891 Unspecified atrial fibrillation: Secondary | ICD-10-CM | POA: Diagnosis not present

## 2022-09-20 DIAGNOSIS — N189 Chronic kidney disease, unspecified: Secondary | ICD-10-CM | POA: Diagnosis not present

## 2022-09-20 DIAGNOSIS — E871 Hypo-osmolality and hyponatremia: Secondary | ICD-10-CM

## 2022-09-20 DIAGNOSIS — I10 Essential (primary) hypertension: Secondary | ICD-10-CM | POA: Diagnosis not present

## 2022-09-20 DIAGNOSIS — N186 End stage renal disease: Secondary | ICD-10-CM | POA: Diagnosis not present

## 2022-09-20 LAB — GLUCOSE, CAPILLARY
Glucose-Capillary: 159 mg/dL — ABNORMAL HIGH (ref 70–99)
Glucose-Capillary: 211 mg/dL — ABNORMAL HIGH (ref 70–99)
Glucose-Capillary: 197 mg/dL — ABNORMAL HIGH (ref 70–99)
Glucose-Capillary: 172 mg/dL — ABNORMAL HIGH (ref 70–99)

## 2022-09-20 MED ORDER — ORAL CARE MOUTH RINSE: 15.0000 mL | OROMUCOSAL | Status: DC | PRN

## 2022-09-20 NOTE — Progress Notes (Signed)
Marked new areas of redness on patient's R-arm. Consulted w/charge nurse about concerns of redness spread. Charge nurse called pharmacy and was informed that there is no counteracting agent for Cardizem. Pharmacy recommended ice application. Placed ice pack on patient's arm.

## 2022-09-20 NOTE — Progress Notes (Signed)
Patient does not want AM Creon until AFTER she has had breakfast. Will report to day team.

## 2022-09-20 NOTE — Plan of Care (Signed)

## 2022-09-20 NOTE — Progress Notes (Signed)
PT Cancellation Note  Patient Details Name: Kim Burns MRN: [id number] DOB: 1935/10/19   Cancelled Treatment:    Reason Eval/Treat Not Completed: Patient at procedure or test/unavailable (HD). PT will continue to f/u with pt acutely as available and appropriate.   Alessandra Bevels  09/20/2022, 8:44 AM

## 2022-09-20 NOTE — Progress Notes (Signed)
Patient is alert and oriented x4 but very forgetful. Short term memory loss. R-arm redness seems to be spreading beyond where it was marked by a staff member prior to the day team on 8/1. Patient is argumentative regarding care and requires education when performing tasks. Patient will initially refuse but if staff explains in great detail, she will usually agree and allow staff to treat her. She denied additional needs at this time.

## 2022-09-20 NOTE — Progress Notes (Signed)
[facility] Progress Note   Subjective:   Off diltiazem gtt.   She is feeling much improved but just tired.  R arm IV infiltrated and is very bruised.  Seen at onset of dialysis.   Objective Vitals:   09/20/22 0545 09/20/22 0713 09/20/22 0829 09/20/22 0830  BP:  (!) 158/99    Pulse:  (!) 102    Resp:  17    Temp:  97.7 F (36.5 C)    TempSrc:  Oral    SpO2:  96%    Weight: 56.6 kg  57.5 kg 57.5 kg   Physical Exam General: lying flat in bed, nontoxic Heart: a fib rate 100s, no rub Lungs: clear ant Abdomen: soft Extremities: no edema, R forearm and AC fossa with extensive ecchymosis, no warmth or fluctuance Dialysis Access:  LUE AVF +t/b  Additional Objective Labs: Basic Metabolic Panel: Recent Labs  Lab 09/18/22 0250 09/19/22 0302 09/20/22 0103  NA 133* 128* 130*  K 3.4* 4.1 3.7  CL 94* 92* 94*  CO2 27 22 23   GLUCOSE 172* 154* 118*  BUN 16 30* 42*  CREATININE 2.13* 3.27* 3.91*  CALCIUM 7.3* 6.9* 7.0*  PHOS 3.9 4.4 4.9*   Liver Function Tests: Recent Labs  Lab 09/15/22 0036 09/16/22 0606 09/17/22 0131 09/18/22 0250 09/19/22 0302 09/20/22 0103  AST 27 23  --  12*  --   --   ALT 72* 54*  --  30  --   --   ALKPHOS 68 67  --  48  --   --   BILITOT 0.7 1.1  --  0.7  --   --   PROT 5.7* 5.7*  --  4.8*  --   --   ALBUMIN 2.8* 2.6*   < > 2.1* 2.3* 2.2*   < > = values in this interval not displayed.   No results for input(s): "LIPASE", "AMYLASE" in the last 168 hours. CBC: Recent Labs  Lab 09/15/22 0036 09/16/22 0606 09/17/22 0131 09/18/22 0250 09/19/22 0302 09/20/22 0103  WBC 12.8* 13.2* 9.9 8.1 8.0 7.2  NEUTROABS 11.1* 12.1*  --  6.8  --   --   HGB 10.2* 10.4* 10.5* 9.4* 10.0* 10.5*  HCT 32.3* 33.8* 32.7* 29.7* 31.0* 33.3*  MCV 91.8 93.1 94.0 91.7 93.9 94.6  PLT 236 230 229 216 228 294   Blood Culture    Component Value Date/Time   SDES BLOOD RIGHT ANTECUBITAL 01/22/2022 0038   SPECREQUEST  01/22/2022 0038    BOTTLES DRAWN  AEROBIC AND ANAEROBIC Blood Culture results may not be optimal due to an inadequate volume of blood received in culture bottles   CULT  01/22/2022 0038    NO GROWTH 5 DAYS Performed at [facility], [address], [place], [place] [address]    REPTSTATUS 01/27/2022 FINAL 01/22/2022 0038    Cardiac Enzymes: No results for input(s): "CKTOTAL", "CKMB", "CKMBINDEX", "TROPONINI" in the last 168 hours. CBG: Recent Labs  Lab 09/19/22 0556 09/19/22 1100 09/19/22 1557 09/19/22 2107 09/20/22 0644  GLUCAP 119* 178* 122* 175* 159*   Iron Studies: No results for input(s): "IRON", "TIBC", "TRANSFERRIN", "FERRITIN" in the last 72 hours. @lablastinr3 @ Studies/Results: No results found. Medications:    amiodarone  100 mg Oral Daily   brinzolamide  1 drop Left Eye BID   And   brimonidine  1 drop Left Eye BID   Chlorhexidine Gluconate Cloth  6 each Topical Q0600   cinacalcet  30 mg Oral TID  with meals   erythromycin   Left Eye BID   insulin aspart  0-6 Units Subcutaneous TID WC   lipase/protease/amylase  36,000 Units Oral TID WC   metoprolol tartrate  50 mg Oral BID   mouth rinse  15 mL Mouth Rinse 4 times per day   prednisoLONE acetate  1 drop Left Eye TID   rOPINIRole  0.25 mg Oral BID   sevelamer carbonate  1,600 mg Oral TID WC   valACYclovir  500 mg Oral QHS    OP HD: NW MWF  3.5h  350/ 1.5  55kg  2/2 bath  AVF   Heparin none - last OP HD 7/26, post wt 57.5kg   - coming up 2-3kg over dry wt for last 3 wks - hectorol 5 mcg IV three times per week - mircera 75 mcg IV q 2 wks, last 7/17, due 7/31     Assessment/ Plan: Afib w/ RVR - was on dilt gtt and po amiodarone, BB titrated and off gtt now.  Remains in a fib.  No anticoag due to bleeding risk AMS - likely due to excessive dosing of the valacyclovir at 1 gm tid; the adjustment for esrd patient would be 500 mg daily. Held and rec'd dialysis x 2 then resumed at ESRD dose pm 7/31 as indication was Hsv keratitis vs zoster  ophthalmicus  ESRD - on HD MWF, HD today per usual schedule HTN/ volume - euvolemic on exam, ~ at EDW, BP's normal.  UF 2L today, standing post weight  Anemia esrd - Hb 10-11 here, esa on 7/31. Follow.  MBD ckd - CCa in range, phos is high. Cont binder, renal diet and HD for high phos. Cont IV vdra, sensipar 30 tid.  DM2 - on insulin 8. RLS - increased ropinirole to BID from at bedtime on 8/1.  Estill Bakes MD 09/20/2022, 8:40 AM  [facility] Pager: [phone]

## 2022-09-20 NOTE — Progress Notes (Signed)
   09/20/22 1226  Vitals  Temp (!) 97.2 F (36.2 C)  Pulse Rate 89  Resp 18  BP (!) 149/124  SpO2 99 %  O2 Device Room Air  Post Treatment  Dialyzer Clearance Clear  Duration of HD Treatment -hour(s) 3 hour(s)  Liters Processed 67.3  Fluid Removed (mL) 2000 mL  Tolerated HD Treatment Yes  Post-Hemodialysis Comments tx completed no s/s of distress to note  AVG/AVF Arterial Site Held (minutes) 7 minutes  AVG/AVF Venous Site Held (minutes) 7 minutes   Received patient in bed to unit.  Alert and oriented.  Informed consent signed and in chart.   TX duration:  Patient tolerated well.  Transported back to the room  Alert, without acute distress.  Hand-off given to patient's nurse.   Access used: LUA fistula Access issues: none  Total UF removed: 2000 Medication(s) given: amiodarone, metoprolol Post HD VS: see below Post HD weight: 54.6kg   [facility]

## 2022-09-20 NOTE — Progress Notes (Signed)
PROGRESS NOTE    Kim Burns  WUX:[id number] DOB: 1935-10-09 DOA: 09/13/2022 PCP: Lewis Moccasin, MD    Chief Complaint  Patient presents with   Weakness   Fatigue   Tachycardia    Brief Narrative:  Kim Burns is a 87 y.o. female with medical history significant for paroxysmal atrial fibrillation, end-stage renal disease on hemodialysis on Monday, Wednesday, Friday schedule, type 2 diabetes mellitus, essential hypertension, chronic diastolic heart failure, who is admitted to [facility] on 09/13/2022 with atrial fibrillation with RVR after presenting from home to [facility] ED complaining of palpitations.    She was admitted for atrial fibrillation with RVR.  Hospitalization complicated by AMS related to valtrex in setting of ESRD.  She's improving from mental status standpoint and HR standpoint.   Hopefully can consider discharge in a couple days.    Assessment & Plan:   Principal Problem:   Atrial fibrillation with RVR (HCC) Active Problems:   End-stage renal disease on hemodialysis (HCC)   DM2 (diabetes mellitus, type 2) (HCC)   Chronic diastolic CHF (congestive heart failure) (HCC)   History of anemia due to chronic kidney disease   Essential hypertension   Hypomagnesemia   RLS (restless legs syndrome)   HSV (herpes simplex virus) dendritic keratitis   Transaminitis  #1 A-fib with with RVR -Unclear etiology.  -Chest x-ray with atelectasis versus infiltrates right mid and upper lung. -Patient noted to have converted on amiodarone without recurrence after ablation procedure July 2023 and was on low-dose amiodarone and Coreg prior to admission. -Heart rate improving, currently on diltiazem drip, metoprolol and amiodarone. -Coreg has been substituted and currently on metoprolol.  -It is noted that the patient is not a anticoagulation candidate after discussion with cardiology in the outpatient setting and it was felt that bleeding risk outweighed benefit of  stroke prevention based on shared decision making with cardiology. -If heart rates are difficult to control with diltiazem alone may need to consider IV amiodarone. -Heart rates was controlled on IV diltiazem, metoprolol, amiodarone. -Diltiazem drip has been discontinued. -Continue current dose of amiodarone and metoprolol 50 mg twice daily.  May uptitrate metoprolol as needed for better rate control. -Replete electrolytes to keep potassium approximately 4, magnesium approximately 2. -Cardiology following and appreciate input and recommendations.  2.  Acute metabolic encephalopathy/myoclonus -Felt likely secondary to inappropriate Valtrex dosing and patient with ESRD. -It is noted that Valtrex can cause myoclonus, delirium, encephalopathy, seizures and likely cause of patient's encephalopathy and myoclonic jerks. -Clinical improvement, likely close to baseline.  -MRI head negative for any acute abnormalities. -EEG done with no seizure-like activity noted but excess low voltage fast activity consistent with medication effect, mild to moderate generalized slowing of background consistent with mild to moderate degree of diffusion or multifocal cerebral dysfunction. -Patient currently on HD. -Valtrex restarted at renal dosing of 500 mg daily. -Supportive care.  3.  ESRD on HD/volume overload/bilateral lower extremity edema -Volume overload being managed with hemodialysis.   -Currently in HD. -Per nephrology.    4.  Diastolic CHF/moderately reduced RV SF/moderately elevated PASP -2D echo done with EF of 55 to 60%, NWMA, mildly reduced right ventricular systolic function, severely elevated PASP, severely dilated left atrial size, mild to moderate dilated right atrial size, mild MVR, moderate TVR. -On hemodialysis.  5.  Prolonged QTc -Replete electrolytes to keep potassium approximately 4, magnesium approximately 2. -Repeat EKG with resolution of QTc prolongation.   -Avoid QT prolonging  medications.  6.  Hypomagnesemia -Magnesium at 1.9.   -Follow.   7.  HSV keratitis vs zoster ophthalmicus -Valtrex redosed renally. -Dr. Lowell Guitar discussed with ophthalmologist, Dr. Sinda Du on 7/30 (office [phone] )who recommended immediate outpatient follow-up posthospitalization. -Continue prednisolone acetate ophthalmic suspension, brinzolamide ophthalmic suspension, erythromycin ointment.  Okay with stopping neomycin polymyxin B dexamethasone ophthalmic suspension.  8.  Diabetes mellitus type 2, well-controlled -Hemoglobin A1c 5.8 (09/14/2022) -CBG of 159 this morning. -Diet controlled as per med rec patient not on any diabetic medications prior to admission. -SSI.  9.  Hypertension -Noted to be on Coreg prior to admission however uses midodrine on HD days. -Coreg substituted for metoprolol.   10.  Anemia of chronic disease -Follow H&H.  11.  RLS -Requip dose increased per nephrology to twice daily..  12.  Transaminitis -Resolved.  13.  Right upper extremity IV infiltration -Patient noted to have IV infiltrated yesterday of the right upper extremity with significant ecchymosis on the right forearm. -Continue ice and keep upper extremity elevated.  14.  Hyponatremia -Likely secondary to hypervolemic hyponatremia -Improving with hemodialysis.   DVT prophylaxis: SCDs Code Status: Full Family Communication: Updated patient, no family at bedside.  Disposition: TBD  Status is: Inpatient Remains inpatient appropriate because: Severity of of illness   Consultants:  Cardiology: Dr. Jacques Navy 09/16/2022 Nephrology: Dr. Malachi Bonds 09/15/2022  Procedures:  Chest x-ray 09/14/2022 MRI brain 09/14/2022 2D echo 09/15/2022 EEG 09/15/2022   Antimicrobials:  Anti-infectives (From admission, onward)    Start     Dose/Rate Route Frequency Ordered Stop   09/17/22 2200  valACYclovir (VALTREX) tablet 500 mg       Note to Pharmacy: Start after dialysis 7/30   500 mg Oral Daily  at bedtime 09/17/22 1946     09/14/22 2200  valACYclovir (VALTREX) tablet 1,000 mg  Status:  Discontinued        1,000 mg Oral 3 times daily 09/14/22 1716 09/15/22 0956         Subjective: In hemodialysis.  Overall feeling better.  Denies any chest pain or shortness of breath.  Denies any palpitations.  No abdominal pain.  Complains of generalized weakness and inquiring about SNF placement on discharge.    Objective: Vitals:   09/20/22 0836 09/20/22 0844 09/20/22 0900 09/20/22 0930  BP: (!) 194/62 (!) 190/85 (!) 210/84 (!) 187/63  Pulse: 100 96 (!) 107 100  Resp: 20 17 20 16   Temp: (!) 97.3 F (36.3 C)     TempSrc:      SpO2: 100% 100% 100% 100%  Weight:        Intake/Output Summary (Last 24 hours) at 09/20/2022 1000 Last data filed at 09/20/2022 0112 Gross per 24 hour  Intake 195.84 ml  Output 50 ml  Net 145.84 ml   Filed Weights   09/20/22 0545 09/20/22 0829 09/20/22 0830  Weight: 56.6 kg 57.5 kg 57.5 kg    Examination:  General exam: NAD Respiratory system: CTAB anterior lung fields.  No wheezes, no crackles, no rhonchi.  Fair air movement.  Speaking in full sentences.   Cardiovascular system: Irregularly irregular.  No JVD, no murmurs rubs or gallops.  No pitting lower extremity edema.  Gastrointestinal system: Abdomen is soft, nontender, nondistended, positive bowel sounds.  Left-sided percutaneous nephrostomy tube intact.  Central nervous system: Alert and oriented. No focal neurological deficits. Extremities: Right forearm ecchymotic, significantly decreased tenderness to palpation after IV removed.  Skin: No rashes, lesions or ulcers Psychiatry: Judgement and insight appear normal. Mood &  affect appropriate.     Data Reviewed: I have personally reviewed following labs and imaging studies  CBC: Recent Labs  Lab 09/14/22 0506 09/15/22 0036 09/16/22 0606 09/17/22 0131 09/18/22 0250 09/19/22 0302 09/20/22 0103  WBC 9.7 12.8* 13.2* 9.9 8.1 8.0 7.2   NEUTROABS 8.3* 11.1* 12.1*  --  6.8  --   --   HGB 10.5* 10.2* 10.4* 10.5* 9.4* 10.0* 10.5*  HCT 34.1* 32.3* 33.8* 32.7* 29.7* 31.0* 33.3*  MCV 92.7 91.8 93.1 94.0 91.7 93.9 94.6  PLT 233 236 230 229 216 228 294    Basic Metabolic Panel: Recent Labs  Lab 09/14/22 0506 09/14/22 0506 09/15/22 0036 09/16/22 0606 09/17/22 0131 09/18/22 0250 09/19/22 0302 09/20/22 0103  NA 135  --  131* 136 137 133* 128* 130*  K 3.7  --  4.2 3.8 4.1 3.4* 4.1 3.7  CL 95*  --  94* 99 97* 94* 92* 94*  CO2 24  --  22 22 24 27 22 23   GLUCOSE 175*  --  123* 139* 120* 172* 154* 118*  BUN 38*  --  48* 25* 38* 16 30* 42*  CREATININE 3.40*  --  4.03* 2.74* 3.59* 2.13* 3.27* 3.91*  CALCIUM 7.6*  --  7.5* 8.0* 7.9* 7.3* 6.9* 7.0*  MG 2.2  --  2.2 1.8  --  1.6* 1.9  --   PHOS  --    < > 9.0* 6.2* 8.2* 3.9 4.4 4.9*   < > = values in this interval not displayed.    GFR: Estimated Creatinine Clearance: 8.3 mL/min (A) (by C-G formula based on SCr of 3.91 mg/dL (H)).  Liver Function Tests: Recent Labs  Lab 09/14/22 0506 09/15/22 0036 09/16/22 0606 09/17/22 0131 09/18/22 0250 09/19/22 0302 09/20/22 0103  AST 51* 27 23  --  12*  --   --   ALT 90* 72* 54*  --  30  --   --   ALKPHOS 72 68 67  --  48  --   --   BILITOT 0.4 0.7 1.1  --  0.7  --   --   PROT 5.8* 5.7* 5.7*  --  4.8*  --   --   ALBUMIN 2.7* 2.8* 2.6* 2.4* 2.1* 2.3* 2.2*    CBG: Recent Labs  Lab 09/19/22 0556 09/19/22 1100 09/19/22 1557 09/19/22 2107 09/20/22 0644  GLUCAP 119* 178* 122* 175* 159*     No results found for this or any previous visit (from the past 240 hour(s)).       Radiology Studies: No results found.      Scheduled Meds:  amiodarone  100 mg Oral Daily   brinzolamide  1 drop Left Eye BID   And   brimonidine  1 drop Left Eye BID   Chlorhexidine Gluconate Cloth  6 each Topical Q0600   cinacalcet  30 mg Oral TID with meals   erythromycin   Left Eye BID   insulin aspart  0-6 Units Subcutaneous TID WC    lipase/protease/amylase  36,000 Units Oral TID WC   metoprolol tartrate  50 mg Oral BID   mouth rinse  15 mL Mouth Rinse 4 times per day   prednisoLONE acetate  1 drop Left Eye TID   rOPINIRole  0.25 mg Oral BID   sevelamer carbonate  1,600 mg Oral TID WC   valACYclovir  500 mg Oral QHS   Continuous Infusions:     LOS: 5 days    Time spent: 40  mins    Ramiro Harvest, MD [facility]   To contact the attending provider between 7A-7P or the covering provider during after hours 7P-7A, please log into the web site [email or web address] and access using universal [place] password for that web site. If you do not have the password, please call the hospital operator.  09/20/2022, 10:00 AM

## 2022-09-21 DIAGNOSIS — N189 Chronic kidney disease, unspecified: Secondary | ICD-10-CM | POA: Diagnosis not present

## 2022-09-21 DIAGNOSIS — N186 End stage renal disease: Secondary | ICD-10-CM | POA: Diagnosis not present

## 2022-09-21 DIAGNOSIS — I4891 Unspecified atrial fibrillation: Secondary | ICD-10-CM | POA: Diagnosis not present

## 2022-09-21 DIAGNOSIS — I10 Essential (primary) hypertension: Secondary | ICD-10-CM | POA: Diagnosis not present

## 2022-09-21 LAB — GLUCOSE, CAPILLARY
Glucose-Capillary: 145 mg/dL — ABNORMAL HIGH (ref 70–99)
Glucose-Capillary: 196 mg/dL — ABNORMAL HIGH (ref 70–99)
Glucose-Capillary: 178 mg/dL — ABNORMAL HIGH (ref 70–99)
Glucose-Capillary: 158 mg/dL — ABNORMAL HIGH (ref 70–99)

## 2022-09-21 NOTE — Plan of Care (Signed)

## 2022-09-21 NOTE — Progress Notes (Signed)
Patient is alert and oriented x4. Has been restless throughout the night and has been wanting to sit up on the side of the bed and lie back down intermittently. She denied pain. R-arm remains a dark maroon color but she states that it does not hurt. Applied Voltaren gel to bilateral legs at patient's request. She stated that the gel helps with her restless legs. L-nephrostomy tube's output was 50 mls. Blood glucose was 172. Refused labs this morning.  Patient denied additional needs.

## 2022-09-21 NOTE — Progress Notes (Signed)
PROGRESS NOTE    Kim Burns  XLK:[id number] DOB: 1935-12-22 DOA: 09/13/2022 PCP: Kim Moccasin, MD    Chief Complaint  Patient presents with   Weakness   Fatigue   Tachycardia    Brief Narrative:  Kim Burns is a 87 y.o. female with medical history significant for paroxysmal atrial fibrillation, end-stage renal disease on hemodialysis on Monday, Wednesday, Friday schedule, type 2 diabetes mellitus, essential hypertension, chronic diastolic heart failure, who is admitted to [facility] on 09/13/2022 with atrial fibrillation with RVR after presenting from home to [facility] ED complaining of palpitations.    She was admitted for atrial fibrillation with RVR.  Hospitalization complicated by AMS related to valtrex in setting of ESRD.  She's improving from mental status standpoint and HR standpoint.   Hopefully can consider discharge in a couple days.    Assessment & Plan:   Principal Problem:   Atrial fibrillation with RVR (HCC) Active Problems:   End-stage renal disease on hemodialysis (HCC)   DM2 (diabetes mellitus, type 2) (HCC)   Chronic diastolic CHF (congestive heart failure) (HCC)   History of anemia due to chronic kidney disease   Essential hypertension   Hypomagnesemia   RLS (restless legs syndrome)   HSV (herpes simplex virus) dendritic keratitis   Transaminitis  #1 A-fib with with RVR -Unclear etiology.  -Chest x-ray with atelectasis versus infiltrates right mid and upper lung. -Patient noted to have converted on amiodarone without recurrence after ablation procedure July 2023 and was on low-dose amiodarone and Coreg prior to admission. -Heart rate improving, currently on metoprolol and amiodarone. -Coreg has been substituted and currently on metoprolol.  -It is noted that the patient is not a anticoagulation candidate after discussion with cardiology in the outpatient setting and it was felt that bleeding risk outweighed benefit of stroke prevention  based on shared decision making with cardiology. -Heart rates was controlled early on in the hospitalization on IV diltiazem gtt., metoprolol, amiodarone. -Diltiazem drip has been discontinued. -Continue current dose of amiodarone and metoprolol 50 mg twice daily.  May uptitrate metoprolol as needed for better rate control. -Replete electrolytes to keep potassium approximately 4, magnesium approximately 2. -Cardiology was following but have signed off.  -Outpatient follow-up with cardiology.  2.  Acute metabolic encephalopathy/myoclonus -Felt likely secondary to inappropriate Valtrex dosing and patient with ESRD. -It is noted that Valtrex can cause myoclonus, delirium, encephalopathy, seizures and likely cause of patient's encephalopathy and myoclonic jerks. -Clinical improvement, likely close to baseline.  -MRI head negative for any acute abnormalities. -EEG done with no seizure-like activity noted but excess low voltage fast activity consistent with medication effect, mild to moderate generalized slowing of background consistent with mild to moderate degree of diffusion or multifocal cerebral dysfunction. -Patient currently on HD. -Valtrex restarted at renal dosing of 500 mg daily. -Supportive care.  3.  ESRD on HD/volume overload/bilateral lower extremity edema -Volume overload being managed with hemodialysis.   -Per nephrology.    4.  Diastolic CHF/moderately reduced RV SF/moderately elevated PASP -2D echo done with EF of 55 to 60%, NWMA, mildly reduced right ventricular systolic function, severely elevated PASP, severely dilated left atrial size, mild to moderate dilated right atrial size, mild MVR, moderate TVR. -On hemodialysis which is managing volume status.  5.  Prolonged QTc -Replete electrolytes to keep potassium approximately 4, magnesium approximately 2. -Repeat EKG with resolution of QTc prolongation.   -Avoid QT prolonging medications.  6.  Hypomagnesemia -Magnesium at  1.9.   -  Follow.   7.  HSV keratitis vs zoster ophthalmicus -Valtrex redosed renally. -Kim Burns discussed with ophthalmologist, Kim Burns on 7/30 (office [phone] )who recommended immediate outpatient follow-up posthospitalization. -Continue prednisolone acetate ophthalmic suspension, brinzolamide ophthalmic suspension, erythromycin ointment.  Okay with stopping neomycin polymyxin B dexamethasone ophthalmic suspension.  8.  Diabetes mellitus type 2, well-controlled -Hemoglobin A1c 5.8 (09/14/2022) -CBG of 145 this morning. -Diet controlled as per med rec patient not on any diabetic medications prior to admission. -SSI.  9.  Hypertension -Noted to be on Coreg prior to admission however uses midodrine on HD days. -Coreg substituted for metoprolol.   10.  Anemia of chronic disease -Follow H&H.  11.  RLS -Continue current increased Requip dose at twice daily as adjusted per nephrology.    12.  Transaminitis -Resolved.  13.  Right upper extremity IV infiltration -Patient noted to have IV infiltrated yesterday of the right upper extremity with significant ecchymosis on the right forearm. -Continue ice and keep upper extremity elevated.  14.  Hyponatremia -Likely secondary to hypervolemic hyponatremia -Improving with hemodialysis.   DVT prophylaxis: SCDs Code Status: Full Family Communication: Updated patient, son and daughter-in-law at bedside.  Disposition: TBD  Status is: Inpatient Remains inpatient appropriate because: Severity of of illness   Consultants:  Cardiology: Kim Burns 09/16/2022 Nephrology: Kim Burns 09/15/2022  Procedures:  Chest x-ray 09/14/2022 MRI brain 09/14/2022 2D echo 09/15/2022 EEG 09/15/2022   Antimicrobials:  Anti-infectives (From admission, onward)    Start     Dose/Rate Route Frequency Ordered Stop   09/17/22 2200  valACYclovir (VALTREX) tablet 500 mg       Note to Pharmacy: Start after dialysis 7/30   500 mg Oral Daily at  bedtime 09/17/22 1946     09/14/22 2200  valACYclovir (VALTREX) tablet 1,000 mg  Status:  Discontinued        1,000 mg Oral 3 times daily 09/14/22 1716 09/15/22 0956         Subjective: Sitting up at the side of the bed.  Son and daughter-in-law at bedside.  Overall feels well.  Denies any chest pain or shortness of breath.  Denies any palpitations.  Complaining of some itchiness on right upper extremity around IV infiltration site.  Patient noted to have refused labs this morning.  Objective: Vitals:   09/21/22 0325 09/21/22 0332 09/21/22 0808 09/21/22 1116  BP: 113/82  (!) 120/43 110/62  Pulse: (!) 103  (!) 102 90  Resp: 19  18 16   Temp: 97.8 F (36.6 C)  98.4 F (36.9 C) 97.6 F (36.4 C)  TempSrc: Oral  Axillary Oral  SpO2: 99%  98% 99%  Weight:  55.3 kg      Intake/Output Summary (Last 24 hours) at 09/21/2022 1134 Last data filed at 09/21/2022 0800 Gross per 24 hour  Intake 960 ml  Output 2000 ml  Net -1040 ml   Filed Weights   09/20/22 0830 09/20/22 1300 09/21/22 0332  Weight: 57.5 kg 54.6 kg 55.3 kg    Examination:  General exam: NAD Respiratory system: Lungs clear to auscultation bilaterally.  No wheezes, no crackles, no rhonchi.  Fair air movement.  Speaking in full sentences.  Cardiovascular system: Irregularly irregular.  No JVD, no murmurs rubs or gallops.  No lower extremity pitting edema.   Gastrointestinal system: Abdomen is soft, nontender, nondistended, positive bowel sounds.  Left-sided percutaneous nephrostomy tube intact.  Central nervous system: Alert and oriented. No focal neurological deficits. Extremities: Right forearm ecchymotic, significantly  decreased tenderness to palpation after IV removed.  Skin: No rashes, lesions or ulcers Psychiatry: Judgement and insight appear normal. Mood & affect appropriate.     Data Reviewed: I have personally reviewed following labs and imaging studies  CBC: Recent Labs  Lab 09/15/22 0036 09/16/22 0606  09/17/22 0131 09/18/22 0250 09/19/22 0302 09/20/22 0103  WBC 12.8* 13.2* 9.9 8.1 8.0 7.2  NEUTROABS 11.1* 12.1*  --  6.8  --   --   HGB 10.2* 10.4* 10.5* 9.4* 10.0* 10.5*  HCT 32.3* 33.8* 32.7* 29.7* 31.0* 33.3*  MCV 91.8 93.1 94.0 91.7 93.9 94.6  PLT 236 230 229 216 228 294    Basic Metabolic Panel: Recent Labs  Lab 09/15/22 0036 09/16/22 0606 09/17/22 0131 09/18/22 0250 09/19/22 0302 09/20/22 0103  NA 131* 136 137 133* 128* 130*  K 4.2 3.8 4.1 3.4* 4.1 3.7  CL 94* 99 97* 94* 92* 94*  CO2 22 22 24 27 22 23   GLUCOSE 123* 139* 120* 172* 154* 118*  BUN 48* 25* 38* 16 30* 42*  CREATININE 4.03* 2.74* 3.59* 2.13* 3.27* 3.91*  CALCIUM 7.5* 8.0* 7.9* 7.3* 6.9* 7.0*  MG 2.2 1.8  --  1.6* 1.9  --   PHOS 9.0* 6.2* 8.2* 3.9 4.4 4.9*    GFR: Estimated Creatinine Clearance: 8.3 mL/min (A) (by C-G formula based on SCr of 3.91 mg/dL (H)).  Liver Function Tests: Recent Labs  Lab 09/15/22 0036 09/16/22 0606 09/17/22 0131 09/18/22 0250 09/19/22 0302 09/20/22 0103  AST 27 23  --  12*  --   --   ALT 72* 54*  --  30  --   --   ALKPHOS 68 67  --  48  --   --   BILITOT 0.7 1.1  --  0.7  --   --   PROT 5.7* 5.7*  --  4.8*  --   --   ALBUMIN 2.8* 2.6* 2.4* 2.1* 2.3* 2.2*    CBG: Recent Labs  Lab 09/20/22 1553 09/20/22 1734 09/20/22 2117 09/21/22 0632 09/21/22 1113  GLUCAP 211* 197* 172* 145* 196*     No results found for this or any previous visit (from the past 240 hour(s)).       Radiology Studies: No results found.      Scheduled Meds:  amiodarone  100 mg Oral Daily   brinzolamide  1 drop Left Eye BID   And   brimonidine  1 drop Left Eye BID   Chlorhexidine Gluconate Cloth  6 each Topical Q0600   cinacalcet  30 mg Oral TID with meals   erythromycin   Left Eye BID   insulin aspart  0-6 Units Subcutaneous TID WC   lipase/protease/amylase  36,000 Units Oral TID WC   metoprolol tartrate  50 mg Oral BID   mouth rinse  15 mL Mouth Rinse 4 times per day    prednisoLONE acetate  1 drop Left Eye TID   rOPINIRole  0.25 mg Oral BID   sevelamer carbonate  1,600 mg Oral TID WC   valACYclovir  500 mg Oral QHS   Continuous Infusions:     LOS: 6 days    Time spent: 40 mins    Ramiro Harvest, MD [facility]   To contact the attending provider between 7A-7P or the covering provider during after hours 7P-7A, please log into the web site [email or web address] and access using universal [place] password for that web site. If you do not have the password, please  call the hospital operator.  09/21/2022, 11:34 AM

## 2022-09-21 NOTE — Progress Notes (Signed)
Patient refused labs this morning.

## 2022-09-21 NOTE — Progress Notes (Signed)
[facility] Progress Note   Subjective:   tolerated HD fine yesterday. Overall feeling improved. Only c/o pruritus in R arm that had infiltrated IV.   Objective Vitals:   09/20/22 2325 09/21/22 0325 09/21/22 0332 09/21/22 0808  BP: 121/70 113/82  (!) 120/43  Pulse: 100 (!) 103  (!) 102  Resp: 20 19  18   Temp: 98.2 F (36.8 C) 97.8 F (36.6 C)  98.4 F (36.9 C)  TempSrc: Oral Oral  Axillary  SpO2: 97% 99%  98%  Weight:   55.3 kg    Physical Exam General: lying flat in bed, nontoxic Heart: a fib rate 80s, no rub Lungs: clear ant Abdomen: soft Extremities: no edema, R forearm and AC fossa with extensive ecchymosis, no warmth or fluctuance Dialysis Access:  LUE AVF +t/b  Additional Objective Labs: Basic Metabolic Panel: Recent Labs  Lab 09/18/22 0250 09/19/22 0302 09/20/22 0103  NA 133* 128* 130*  K 3.4* 4.1 3.7  CL 94* 92* 94*  CO2 27 22 23   GLUCOSE 172* 154* 118*  BUN 16 30* 42*  CREATININE 2.13* 3.27* 3.91*  CALCIUM 7.3* 6.9* 7.0*  PHOS 3.9 4.4 4.9*   Liver Function Tests: Recent Labs  Lab 09/15/22 0036 09/16/22 0606 09/17/22 0131 09/18/22 0250 09/19/22 0302 09/20/22 0103  AST 27 23  --  12*  --   --   ALT 72* 54*  --  30  --   --   ALKPHOS 68 67  --  48  --   --   BILITOT 0.7 1.1  --  0.7  --   --   PROT 5.7* 5.7*  --  4.8*  --   --   ALBUMIN 2.8* 2.6*   < > 2.1* 2.3* 2.2*   < > = values in this interval not displayed.   No results for input(s): "LIPASE", "AMYLASE" in the last 168 hours. CBC: Recent Labs  Lab 09/15/22 0036 09/16/22 0606 09/17/22 0131 09/18/22 0250 09/19/22 0302 09/20/22 0103  WBC 12.8* 13.2* 9.9 8.1 8.0 7.2  NEUTROABS 11.1* 12.1*  --  6.8  --   --   HGB 10.2* 10.4* 10.5* 9.4* 10.0* 10.5*  HCT 32.3* 33.8* 32.7* 29.7* 31.0* 33.3*  MCV 91.8 93.1 94.0 91.7 93.9 94.6  PLT 236 230 229 216 228 294   Blood Culture    Component Value Date/Time   SDES BLOOD RIGHT ANTECUBITAL 01/22/2022 0038   SPECREQUEST   01/22/2022 0038    BOTTLES DRAWN AEROBIC AND ANAEROBIC Blood Culture results may not be optimal due to an inadequate volume of blood received in culture bottles   CULT  01/22/2022 0038    NO GROWTH 5 DAYS Performed at [facility], [address], [place], [place] [address]    REPTSTATUS 01/27/2022 FINAL 01/22/2022 0038    Cardiac Enzymes: No results for input(s): "CKTOTAL", "CKMB", "CKMBINDEX", "TROPONINI" in the last 168 hours. CBG: Recent Labs  Lab 09/20/22 0644 09/20/22 1553 09/20/22 1734 09/20/22 2117 09/21/22 0632  GLUCAP 159* 211* 197* 172* 145*   Iron Studies: No results for input(s): "IRON", "TIBC", "TRANSFERRIN", "FERRITIN" in the last 72 hours. @lablastinr3 @ Studies/Results: No results found. Medications:    amiodarone  100 mg Oral Daily   brinzolamide  1 drop Left Eye BID   And   brimonidine  1 drop Left Eye BID   Chlorhexidine Gluconate Cloth  6 each Topical Q0600   cinacalcet  30 mg Oral TID with meals   erythromycin  Left Eye BID   insulin aspart  0-6 Units Subcutaneous TID WC   lipase/protease/amylase  36,000 Units Oral TID WC   metoprolol tartrate  50 mg Oral BID   mouth rinse  15 mL Mouth Rinse 4 times per day   prednisoLONE acetate  1 drop Left Eye TID   rOPINIRole  0.25 mg Oral BID   sevelamer carbonate  1,600 mg Oral TID WC   valACYclovir  500 mg Oral QHS    OP HD: [facility] MWF  3.5h  350/ 1.5  55kg  2/2 bath  AVF   Heparin none - last OP HD 7/26, post wt 57.5kg   - coming up 2-3kg over dry wt for last 3 wks - hectorol 5 mcg IV three times per week - mircera 75 mcg IV q 2 wks, last 7/17, due 7/31     Assessment/ Plan: Afib w/ RVR - was on dilt gtt and po amiodarone, BB titrated and off gtt now.  Remains in a fib.  No anticoag due to bleeding risk AMS - likely due to excessive dosing of the valacyclovir at 1 gm tid; the adjustment for esrd patient would be 500 mg daily. Held and rec'd dialysis x 2 then resumed at ESRD dose pm 7/31 as  indication was Hsv keratitis vs zoster ophthalmicus  ESRD - on HD MWF, HD next Mon per usual schedule HTN/ volume - euvolemic on exam, ~ at EDW, BP's normal.  UF 2L yest, standing post weight was 54.6kg - will need EDW adjusted at d/c. Anemia esrd - Hb 10-11 here, esa on 7/31. Follow.  MBD ckd - CCa in range, phos is high. Cont binder, renal diet and HD for high phos. Cont IV vdra, sensipar 30 tid.  DM2 - on insulin 8. RLS - increased ropinirole to BID from at bedtime on 8/1.  Estill Bakes MD 09/21/2022, 11:04 AM   [facility] Pager: [phone]

## 2022-09-22 DIAGNOSIS — I10 Essential (primary) hypertension: Secondary | ICD-10-CM | POA: Diagnosis not present

## 2022-09-22 DIAGNOSIS — N189 Chronic kidney disease, unspecified: Secondary | ICD-10-CM | POA: Diagnosis not present

## 2022-09-22 DIAGNOSIS — I4891 Unspecified atrial fibrillation: Secondary | ICD-10-CM | POA: Diagnosis not present

## 2022-09-22 DIAGNOSIS — N186 End stage renal disease: Secondary | ICD-10-CM | POA: Diagnosis not present

## 2022-09-22 LAB — GLUCOSE, CAPILLARY
Glucose-Capillary: 127 mg/dL — ABNORMAL HIGH (ref 70–99)
Glucose-Capillary: 189 mg/dL — ABNORMAL HIGH (ref 70–99)
Glucose-Capillary: 156 mg/dL — ABNORMAL HIGH (ref 70–99)
Glucose-Capillary: 239 mg/dL — ABNORMAL HIGH (ref 70–99)

## 2022-09-22 MED ORDER — CINACALCET HCL 30 MG PO TABS
30.0000 mg | ORAL_TABLET | ORAL | Status: DC
  Administered 2022-09-23: 30 mg via ORAL
  Filled 2022-09-22: qty 1

## 2022-09-22 MED ORDER — GUAIFENESIN-DM 100-10 MG/5ML PO SYRP
5.0000 mL | ORAL_SOLUTION | ORAL | Status: DC | PRN
  Administered 2022-09-23: 5 mL via ORAL
  Filled 2022-09-22: qty 5

## 2022-09-22 NOTE — Plan of Care (Signed)

## 2022-09-22 NOTE — Progress Notes (Signed)
PROGRESS NOTE    Kim Burns  ZOX:[id number] DOB: 05/02/1935 DOA: 09/13/2022 PCP: Lewis Moccasin, MD    Chief Complaint  Patient presents with   Weakness   Fatigue   Tachycardia    Brief Narrative:  Kim Burns is a 87 y.o. female with medical history significant for paroxysmal atrial fibrillation, end-stage renal disease on hemodialysis on Monday, Wednesday, Friday schedule, type 2 diabetes mellitus, essential hypertension, chronic diastolic heart failure, who is admitted to [facility] on 09/13/2022 with atrial fibrillation with RVR after presenting from home to [facility] ED complaining of palpitations.    She was admitted for atrial fibrillation with RVR.  Hospitalization complicated by AMS related to valtrex in setting of ESRD.  She's improving from mental status standpoint and HR standpoint.   Hopefully can consider discharge in a couple days.    Assessment & Plan:   Principal Problem:   Atrial fibrillation with RVR (HCC) Active Problems:   End-stage renal disease on hemodialysis (HCC)   DM2 (diabetes mellitus, type 2) (HCC)   Chronic diastolic CHF (congestive heart failure) (HCC)   History of anemia due to chronic kidney disease   Essential hypertension   Hypomagnesemia   RLS (restless legs syndrome)   HSV (herpes simplex virus) dendritic keratitis   Transaminitis  #1 A-fib with with RVR -Unclear etiology.  -Chest x-ray with atelectasis versus infiltrates right mid and upper lung. -Patient noted to have converted on amiodarone without recurrence after ablation procedure July 2023 and was on low-dose amiodarone and Coreg prior to admission. -Heart rate improving, currently on metoprolol and amiodarone. -Coreg has been substituted and currently on metoprolol.  -It is noted that the patient is not a anticoagulation candidate after discussion with cardiology in the outpatient setting and it was felt that bleeding risk outweighed benefit of stroke prevention  based on shared decision making with cardiology. -Heart rates was controlled early on in the hospitalization on IV diltiazem gtt., metoprolol, amiodarone. -Diltiazem drip has been discontinued. -Continue current dose of amiodarone and metoprolol 50 mg twice daily.  May uptitrate metoprolol as needed for better rate control. -Replete electrolytes to keep potassium approximately 4, magnesium approximately 2. -Cardiology was following but have signed off.  -Outpatient follow-up with cardiology.  2.  Acute metabolic encephalopathy/myoclonus -Felt likely secondary to inappropriate Valtrex dosing and patient with ESRD. -It is noted that Valtrex can cause myoclonus, delirium, encephalopathy, seizures and likely cause of patient's encephalopathy and myoclonic jerks. -Clinical improvement, likely close to baseline.  -MRI head negative for any acute abnormalities. -EEG done with no seizure-like activity noted but excess low voltage fast activity consistent with medication effect, mild to moderate generalized slowing of background consistent with mild to moderate degree of diffusion or multifocal cerebral dysfunction. -Valtrex restarted at renal dosing of 500 mg daily. -Supportive care.  3.  ESRD on HD/volume overload/bilateral lower extremity edema -Volume status being managed with hemodialysis.   -Per nephrology.    4.  Diastolic CHF/moderately reduced RV SF/moderately elevated PASP -2D echo done with EF of 55 to 60%, NWMA, mildly reduced right ventricular systolic function, severely elevated PASP, severely dilated left atrial size, mild to moderate dilated right atrial size, mild MVR, moderate TVR. -On hemodialysis which is managing volume status.  5.  Prolonged QTc -Replete electrolytes to keep potassium approximately 4, magnesium approximately 2. -Repeat EKG with resolution of QTc prolongation.   -Avoid QT prolonging medications.  6.  Hypomagnesemia -Magnesium at 1.9.   -Follow.   7.  HSV  keratitis vs zoster ophthalmicus -Valtrex redosed renally. -Dr. Lowell Guitar discussed with ophthalmologist, Dr. Sinda Du on 7/30 (office [phone] )who recommended immediate outpatient follow-up posthospitalization. -Continue prednisolone acetate ophthalmic suspension, brinzolamide ophthalmic suspension, erythromycin ointment.  Okay with stopping neomycin polymyxin B dexamethasone ophthalmic suspension.  8.  Diabetes mellitus type 2, well-controlled -Hemoglobin A1c 5.8 (09/14/2022) -CBG of 127 this morning. -Diet controlled as per med rec patient not on any diabetic medications prior to admission. -SSI.  9.  Hypertension -Noted to be on Coreg prior to admission however uses midodrine on HD days. -Coreg substituted for metoprolol.   10.  Anemia of chronic disease -Follow H&H.  11.  RLS -Requip dose increased to twice daily per nephrology.    12.  Transaminitis -Resolved.  13.  Right upper extremity IV infiltration -Patient noted to have IV infiltration of the right upper extremity with significant ecchymosis on the right forearm. -Continue ice and keep upper extremity elevated.  14.  Hyponatremia -Likely secondary to hypervolemic hyponatremia -Improved with hemodialysis.    DVT prophylaxis: SCDs Code Status: Full Family Communication: Updated patient, son and daughter-in-law at bedside.  Disposition: Patient medically stable as of 09/21/2022.  SNF when bed available.  Status is: Inpatient Remains inpatient appropriate because: Severity of of illness   Consultants:  Cardiology: Dr. Jacques Navy 09/16/2022 Nephrology: Dr. Malachi Bonds 09/15/2022  Procedures:  Chest x-ray 09/14/2022 MRI brain 09/14/2022 2D echo 09/15/2022 EEG 09/15/2022   Antimicrobials:  Anti-infectives (From admission, onward)    Start     Dose/Rate Route Frequency Ordered Stop   09/17/22 2200  valACYclovir (VALTREX) tablet 500 mg       Note to Pharmacy: Start after dialysis 7/30   500 mg Oral Daily at bedtime  09/17/22 1946     09/14/22 2200  valACYclovir (VALTREX) tablet 1,000 mg  Status:  Discontinued        1,000 mg Oral 3 times daily 09/14/22 1716 09/15/22 0956         Subjective: Laying in bed.  Feels well today.  Stated he was able to ambulate a few steps to the bathroom.  Denies any chest pain or shortness of breath.  No palpitations.  Stated she ate well for breakfast today.  Son and daughter-in-law at bedside.   Objective: Vitals:   09/21/22 1952 09/21/22 2253 09/22/22 0338 09/22/22 0848  BP: 101/73 (!) 111/45 (!) 132/52 119/67  Pulse: 97 100 91 90  Resp: 17 18 18 15   Temp:  97.8 F (36.6 C) 97.6 F (36.4 C) 98 F (36.7 C)  TempSrc:  Oral Oral Axillary  SpO2: 99% 99% 99% 100%  Weight:   56 kg     Intake/Output Summary (Last 24 hours) at 09/22/2022 1127 Last data filed at 09/21/2022 1952 Gross per 24 hour  Intake 357 ml  Output --  Net 357 ml   Filed Weights   09/20/22 1300 09/21/22 0332 09/22/22 0338  Weight: 54.6 kg 55.3 kg 56 kg    Examination:  General exam: NAD Respiratory system: CTAB.  No wheezes, no crackles, no rhonchi.  Fair air movement.  Speaking in full sentences.  Cardiovascular system: Irregularly irregular.  No JVD, no murmurs rubs or gallops.  No pitting lower extremity edema.  Gastrointestinal system: Abdomen is soft, nontender, nondistended, positive bowel sounds.  Left percutaneous nephrostomy tube with clear urine.  Central nervous system: Alert and oriented. No focal neurological deficits. Extremities: Right forearm ecchymotic, significantly decreased tenderness to palpation after IV removed.  Skin:  No rashes, lesions or ulcers Psychiatry: Judgement and insight appear normal. Mood & affect appropriate.     Data Reviewed: I have personally reviewed following labs and imaging studies  CBC: Recent Labs  Lab 09/16/22 0606 09/17/22 0131 09/18/22 0250 09/19/22 0302 09/20/22 0103  WBC 13.2* 9.9 8.1 8.0 7.2  NEUTROABS 12.1*  --  6.8  --   --    HGB 10.4* 10.5* 9.4* 10.0* 10.5*  HCT 33.8* 32.7* 29.7* 31.0* 33.3*  MCV 93.1 94.0 91.7 93.9 94.6  PLT 230 229 216 228 294    Basic Metabolic Panel: Recent Labs  Lab 09/16/22 0606 09/17/22 0131 09/18/22 0250 09/19/22 0302 09/20/22 0103  NA 136 137 133* 128* 130*  K 3.8 4.1 3.4* 4.1 3.7  CL 99 97* 94* 92* 94*  CO2 22 24 27 22 23   GLUCOSE 139* 120* 172* 154* 118*  BUN 25* 38* 16 30* 42*  CREATININE 2.74* 3.59* 2.13* 3.27* 3.91*  CALCIUM 8.0* 7.9* 7.3* 6.9* 7.0*  MG 1.8  --  1.6* 1.9  --   PHOS 6.2* 8.2* 3.9 4.4 4.9*    GFR: Estimated Creatinine Clearance: 8.3 mL/min (A) (by C-G formula based on SCr of 3.91 mg/dL (H)).  Liver Function Tests: Recent Labs  Lab 09/16/22 0606 09/17/22 0131 09/18/22 0250 09/19/22 0302 09/20/22 0103  AST 23  --  12*  --   --   ALT 54*  --  30  --   --   ALKPHOS 67  --  48  --   --   BILITOT 1.1  --  0.7  --   --   PROT 5.7*  --  4.8*  --   --   ALBUMIN 2.6* 2.4* 2.1* 2.3* 2.2*    CBG: Recent Labs  Lab 09/21/22 0632 09/21/22 1113 09/21/22 1701 09/21/22 2056 09/22/22 0605  GLUCAP 145* 196* 178* 158* 127*     No results found for this or any previous visit (from the past 240 hour(s)).       Radiology Studies: No results found.      Scheduled Meds:  amiodarone  100 mg Oral Daily   brinzolamide  1 drop Left Eye BID   And   brimonidine  1 drop Left Eye BID   Chlorhexidine Gluconate Cloth  6 each Topical Q0600   cinacalcet  30 mg Oral TID with meals   erythromycin   Left Eye BID   insulin aspart  0-6 Units Subcutaneous TID WC   lipase/protease/amylase  36,000 Units Oral TID WC   metoprolol tartrate  50 mg Oral BID   mouth rinse  15 mL Mouth Rinse 4 times per day   prednisoLONE acetate  1 drop Left Eye TID   rOPINIRole  0.25 mg Oral BID   sevelamer carbonate  1,600 mg Oral TID WC   valACYclovir  500 mg Oral QHS   Continuous Infusions:     LOS: 7 days    Time spent: 40 mins    Ramiro Harvest,  MD [facility]   To contact the attending provider between 7A-7P or the covering provider during after hours 7P-7A, please log into the web site [email or web address] and access using universal [place] password for that web site. If you do not have the password, please call the hospital operator.  09/22/2022, 11:27 AM

## 2022-09-22 NOTE — Progress Notes (Signed)
[facility] Progress Note   Subjective:   doing well, no new issues.  For HD tomorrow.   Objective Vitals:   09/21/22 1916 09/21/22 1952 09/21/22 2253 09/22/22 0338  BP:  101/73 (!) 111/45 (!) 132/52  Pulse: 93 97 100 91  Resp:  17 18 18   Temp: 98.2 F (36.8 C)  97.8 F (36.6 C) 97.6 F (36.4 C)  TempSrc: Oral  Oral Oral  SpO2:  99% 99% 99%  Weight:    56 kg   Physical Exam General: lying flat in bed, nontoxic Heart: a fib rate 80s, no rub Lungs: clear ant Abdomen: soft Extremities: no edema, R forearm and AC fossa with extensive ecchymosis, no warmth or fluctuance - looks a bit better Dialysis Access:  LUE AVF +t/b  Additional Objective Labs: Basic Metabolic Panel: Recent Labs  Lab 09/18/22 0250 09/19/22 0302 09/20/22 0103  NA 133* 128* 130*  K 3.4* 4.1 3.7  CL 94* 92* 94*  CO2 27 22 23   GLUCOSE 172* 154* 118*  BUN 16 30* 42*  CREATININE 2.13* 3.27* 3.91*  CALCIUM 7.3* 6.9* 7.0*  PHOS 3.9 4.4 4.9*   Liver Function Tests: Recent Labs  Lab 09/16/22 0606 09/17/22 0131 09/18/22 0250 09/19/22 0302 09/20/22 0103  AST 23  --  12*  --   --   ALT 54*  --  30  --   --   ALKPHOS 67  --  48  --   --   BILITOT 1.1  --  0.7  --   --   PROT 5.7*  --  4.8*  --   --   ALBUMIN 2.6*   < > 2.1* 2.3* 2.2*   < > = values in this interval not displayed.   No results for input(s): "LIPASE", "AMYLASE" in the last 168 hours. CBC: Recent Labs  Lab 09/16/22 0606 09/17/22 0131 09/18/22 0250 09/19/22 0302 09/20/22 0103  WBC 13.2* 9.9 8.1 8.0 7.2  NEUTROABS 12.1*  --  6.8  --   --   HGB 10.4* 10.5* 9.4* 10.0* 10.5*  HCT 33.8* 32.7* 29.7* 31.0* 33.3*  MCV 93.1 94.0 91.7 93.9 94.6  PLT 230 229 216 228 294   Blood Culture    Component Value Date/Time   SDES BLOOD RIGHT ANTECUBITAL 01/22/2022 0038   SPECREQUEST  01/22/2022 0038    BOTTLES DRAWN AEROBIC AND ANAEROBIC Blood Culture results may not be optimal due to an inadequate volume of blood received  in culture bottles   CULT  01/22/2022 0038    NO GROWTH 5 DAYS Performed at [facility], [address] [place], [place] [address]    REPTSTATUS 01/27/2022 FINAL 01/22/2022 0038    Cardiac Enzymes: No results for input(s): "CKTOTAL", "CKMB", "CKMBINDEX", "TROPONINI" in the last 168 hours. CBG: Recent Labs  Lab 09/21/22 0632 09/21/22 1113 09/21/22 1701 09/21/22 2056 09/22/22 0605  GLUCAP 145* 196* 178* 158* 127*   Iron Studies: No results for input(s): "IRON", "TIBC", "TRANSFERRIN", "FERRITIN" in the last 72 hours. @lablastinr3 @ Studies/Results: No results found. Medications:    amiodarone  100 mg Oral Daily   brinzolamide  1 drop Left Eye BID   And   brimonidine  1 drop Left Eye BID   Chlorhexidine Gluconate Cloth  6 each Topical Q0600   cinacalcet  30 mg Oral TID with meals   erythromycin   Left Eye BID   insulin aspart  0-6 Units Subcutaneous TID WC   lipase/protease/amylase  36,000 Units  Oral TID WC   metoprolol tartrate  50 mg Oral BID   mouth rinse  15 mL Mouth Rinse 4 times per day   prednisoLONE acetate  1 drop Left Eye TID   rOPINIRole  0.25 mg Oral BID   sevelamer carbonate  1,600 mg Oral TID WC   valACYclovir  500 mg Oral QHS    OP HD: [facility] MWF  3.5h  350/ 1.5  55kg  2/2 bath  AVF   Heparin none - last OP HD 7/26, post wt 57.5kg   - coming up 2-3kg over dry wt for last 3 wks - hectorol 5 mcg IV three times per week - mircera 75 mcg IV q 2 wks, last 7/17, due 7/31     Assessment/ Plan: Afib w/ RVR - was on dilt gtt and po amiodarone, BB titrated and off gtt now.  Remains in a fib, rate controlled.  No anticoag due to bleeding risk AMS - likely due to excessive dosing of the valacyclovir at 1 gm tid; the adjustment for esrd patient would be 500 mg daily. Held and rec'd dialysis x 2 then resumed at ESRD dose pm 7/31 as indication was Hsv keratitis vs zoster ophthalmicus  ESRD - on HD MWF, HD next Mon per usual schedule HTN/ volume - euvolemic on  exam, BP's normal.  Last tx standing post weight was 54.6kg - will need EDW adjusted at d/c. Anemia esrd - Hb 10-11 here, esa on 7/31. Follow.  MBD ckd - CCa in range, phos is high. Cont binder, renal diet and HD for high phos. Cont IV vdra, sensipar 30 tid.  DM2 - on insulin 8. RLS - increased ropinirole to BID from at bedtime on 8/1.  Estill Bakes MD 09/22/2022, 8:17 AM  [facility] Pager: [phone]

## 2022-09-23 DIAGNOSIS — N186 End stage renal disease: Secondary | ICD-10-CM | POA: Diagnosis not present

## 2022-09-23 DIAGNOSIS — I4891 Unspecified atrial fibrillation: Secondary | ICD-10-CM | POA: Diagnosis not present

## 2022-09-23 DIAGNOSIS — N189 Chronic kidney disease, unspecified: Secondary | ICD-10-CM | POA: Diagnosis not present

## 2022-09-23 DIAGNOSIS — B0052 Herpesviral keratitis: Secondary | ICD-10-CM | POA: Diagnosis not present

## 2022-09-23 LAB — GLUCOSE, CAPILLARY
Glucose-Capillary: 117 mg/dL — ABNORMAL HIGH (ref 70–99)
Glucose-Capillary: 101 mg/dL — ABNORMAL HIGH (ref 70–99)
Glucose-Capillary: 179 mg/dL — ABNORMAL HIGH (ref 70–99)
Glucose-Capillary: 169 mg/dL — ABNORMAL HIGH (ref 70–99)

## 2022-09-23 NOTE — Progress Notes (Signed)
PROGRESS NOTE    Kim Burns  UUV:[id number] DOB: Jan 27, 1936 DOA: 09/13/2022 PCP: Lewis Moccasin, MD    Chief Complaint  Patient presents with   Weakness   Fatigue   Tachycardia    Brief Narrative:  Kim Burns is a 87 y.o. female with medical history significant for paroxysmal atrial fibrillation, end-stage renal disease on hemodialysis on Monday, Wednesday, Friday schedule, type 2 diabetes mellitus, essential hypertension, chronic diastolic heart failure, who is admitted to [facility] on 09/13/2022 with atrial fibrillation with RVR after presenting from home to [facility] ED complaining of palpitations.    She was admitted for atrial fibrillation with RVR.  Hospitalization complicated by AMS related to valtrex in setting of ESRD.  She's improving from mental status standpoint and HR standpoint.   Hopefully can consider discharge in a couple days.    Assessment & Plan:   Principal Problem:   Atrial fibrillation with RVR (HCC) Active Problems:   End-stage renal disease on hemodialysis (HCC)   DM2 (diabetes mellitus, type 2) (HCC)   Chronic diastolic CHF (congestive heart failure) (HCC)   History of anemia due to chronic kidney disease   Essential hypertension   Hypomagnesemia   RLS (restless legs syndrome)   HSV (herpes simplex virus) dendritic keratitis   Transaminitis  #1 A-fib with with RVR -Unclear etiology.  -Chest x-ray with atelectasis versus infiltrates right mid and upper lung. -Patient noted to have converted on amiodarone without recurrence after ablation procedure July 2023 and was on low-dose amiodarone and Coreg prior to admission. -Heart rate improving, currently on metoprolol and amiodarone. -Coreg has been substituted and currently on metoprolol.  -It is noted that the patient is not a anticoagulation candidate after discussion with cardiology in the outpatient setting and it was felt that bleeding risk outweighed benefit of stroke prevention  based on shared decision making with cardiology. -Heart rates was controlled early on in the hospitalization on IV diltiazem gtt., metoprolol, amiodarone. -Diltiazem drip has been discontinued. -Continue current dose of amiodarone and metoprolol 50 mg twice daily.  May uptitrate metoprolol as needed for better rate control. -Replete electrolytes to keep potassium approximately 4, magnesium approximately 2. -Cardiology was following but have signed off.  -Outpatient follow-up with cardiology.  2.  Acute metabolic encephalopathy/myoclonus -Felt likely secondary to inappropriate Valtrex dosing and patient with ESRD. -It is noted that Valtrex can cause myoclonus, delirium, encephalopathy, seizures and likely cause of patient's encephalopathy and myoclonic jerks. -Clinical improvement, likely close to baseline.  -MRI head negative for any acute abnormalities. -EEG done with no seizure-like activity noted but excess low voltage fast activity consistent with medication effect, mild to moderate generalized slowing of background consistent with mild to moderate degree of diffusion or multifocal cerebral dysfunction. -Valtrex restarted at renal dosing of 500 mg daily. -Supportive care.  3.  ESRD on HD/volume overload/bilateral lower extremity edema -Volume status being managed by HD.  -Per nephrology.    4.  Diastolic CHF/moderately reduced RV SF/moderately elevated PASP -2D echo done with EF of 55 to 60%, NWMA, mildly reduced right ventricular systolic function, severely elevated PASP, severely dilated left atrial size, mild to moderate dilated right atrial size, mild MVR, moderate TVR. -On hemodialysis which is managing volume status.  5.  Prolonged QTc -Replete electrolytes to keep potassium approximately 4, magnesium approximately 2. -Repeat EKG with resolution of QTc prolongation.   -Avoid QT prolonging medications.  6.  Hypomagnesemia -Magnesium at 1.9.   -Follow.   7.  HSV keratitis vs  zoster ophthalmicus -Valtrex redosed renally. -Dr. Lowell Guitar discussed with ophthalmologist, Dr. Sinda Du on 7/30 (office [phone] )who recommended immediate outpatient follow-up posthospitalization. -Continue prednisolone acetate ophthalmic suspension, brinzolamide ophthalmic suspension, erythromycin ointment.  Okay with stopping neomycin polymyxin B dexamethasone ophthalmic suspension.  8.  Diabetes mellitus type 2, well-controlled -Hemoglobin A1c 5.8 (09/14/2022) -CBG of 117 this morning. -Diet controlled as per med rec patient not on any diabetic medications prior to admission. -SSI.  9.  Hypertension -Noted to be on Coreg prior to admission however uses midodrine on HD days. -Coreg substituted for metoprolol.   10.  Anemia of chronic disease -Follow H&H.  11.  RLS -Continue increased dose of Requip.  12.  Transaminitis -Resolved.  13.  Right upper extremity IV infiltration -Patient noted to have IV infiltration of the right upper extremity with significant ecchymosis on the right forearm. -Continue ice and keep upper extremity elevated.  14.  Hyponatremia -Likely secondary to hypervolemic hyponatremia -Improved with hemodialysis.    DVT prophylaxis: SCDs Code Status: Full Family Communication: Updated patient, no family at bedside.  Disposition: Patient medically stable as of 09/21/2022.  SNF when bed available.  Status is: Inpatient Remains inpatient appropriate because: Severity of of illness   Consultants:  Cardiology: Dr. Jacques Navy 09/16/2022 Nephrology: Dr. Malachi Bonds 09/15/2022  Procedures:  Chest x-ray 09/14/2022 MRI brain 09/14/2022 2D echo 09/15/2022 EEG 09/15/2022   Antimicrobials:  Anti-infectives (From admission, onward)    Start     Dose/Rate Route Frequency Ordered Stop   09/17/22 2200  valACYclovir (VALTREX) tablet 500 mg       Note to Pharmacy: Start after dialysis 7/30   500 mg Oral Daily at bedtime 09/17/22 1946     09/14/22 2200  valACYclovir  (VALTREX) tablet 1,000 mg  Status:  Discontinued        1,000 mg Oral 3 times daily 09/14/22 1716 09/15/22 0956         Subjective: In hemodialysis.  Complaining of restless legs.  No chest pain, no shortness of breath, no palpitations, no abdominal pain.  Overall feeling well.     Objective: Vitals:   09/23/22 0800 09/23/22 0830 09/23/22 0902 09/23/22 0930  BP: (!) 167/74 92/81 115/82 114/70  Pulse: 94 98 98 91  Resp: 16 17 17  (!) 21  Temp:      TempSrc:      SpO2: 99% 100% 100% 100%  Weight:        Intake/Output Summary (Last 24 hours) at 09/23/2022 0938 Last data filed at 09/22/2022 1837 Gross per 24 hour  Intake --  Output 75 ml  Net -75 ml   Filed Weights   09/21/22 0332 09/22/22 0338 09/23/22 0600  Weight: 55.3 kg 56 kg 57.2 kg    Examination:  General exam: NAD Respiratory system: CTAB anterior lung fields.  No wheezes, no crackles, rhonchi.  Fair air movement.  Speaking in full sentences.  Cardiovascular system: Irregularly irregular.  No JVD, no murmurs rubs or gallops.  No pitting lower extremity edema.  Gastrointestinal system: Abdomen is soft, nontender, nondistended, positive bowel sounds.  Left percutaneous nephrostomy tube with clear urine noted.  Central nervous system: Alert and oriented. No focal neurological deficits. Extremities: Right forearm ecchymotic, significantly decreased tenderness to palpation after IV removed.  Skin: No rashes, lesions or ulcers Psychiatry: Judgement and insight appear normal. Mood & affect appropriate.     Data Reviewed: I have personally reviewed following labs and imaging studies  CBC: Recent  Labs  Lab 09/17/22 0131 09/18/22 0250 09/19/22 0302 09/20/22 0103 09/23/22 0801  WBC 9.9 8.1 8.0 7.2 9.9  NEUTROABS  --  6.8  --   --   --   HGB 10.5* 9.4* 10.0* 10.5* 10.0*  HCT 32.7* 29.7* 31.0* 33.3* 31.7*  MCV 94.0 91.7 93.9 94.6 92.7  PLT 229 216 228 294 296    Basic Metabolic Panel: Recent Labs  Lab  09/17/22 0131 09/18/22 0250 09/19/22 0302 09/20/22 0103 09/23/22 0801  NA 137 133* 128* 130* 130*  K 4.1 3.4* 4.1 3.7 3.9  CL 97* 94* 92* 94* 96*  CO2 24 27 22 23  21*  GLUCOSE 120* 172* 154* 118* 150*  BUN 38* 16 30* 42* 66*  CREATININE 3.59* 2.13* 3.27* 3.91* 4.82*  CALCIUM 7.9* 7.3* 6.9* 7.0* 6.6*  MG  --  1.6* 1.9  --   --   PHOS 8.2* 3.9 4.4 4.9* 5.5*    GFR: Estimated Creatinine Clearance: 6.7 mL/min (A) (by C-G formula based on SCr of 4.82 mg/dL (H)).  Liver Function Tests: Recent Labs  Lab 09/17/22 0131 09/18/22 0250 09/19/22 0302 09/20/22 0103 09/23/22 0801  AST  --  12*  --   --   --   ALT  --  30  --   --   --   ALKPHOS  --  48  --   --   --   BILITOT  --  0.7  --   --   --   PROT  --  4.8*  --   --   --   ALBUMIN 2.4* 2.1* 2.3* 2.2* 2.3*    CBG: Recent Labs  Lab 09/22/22 0605 09/22/22 1216 09/22/22 1640 09/22/22 2107 09/23/22 0612  GLUCAP 127* 189* 156* 239* 117*     No results found for this or any previous visit (from the past 240 hour(s)).       Radiology Studies: No results found.      Scheduled Meds:  amiodarone  100 mg Oral Daily   brinzolamide  1 drop Left Eye BID   And   brimonidine  1 drop Left Eye BID   Chlorhexidine Gluconate Cloth  6 each Topical Q0600   cinacalcet  30 mg Oral Q M,W,F-HD   erythromycin   Left Eye BID   insulin aspart  0-6 Units Subcutaneous TID WC   lipase/protease/amylase  36,000 Units Oral TID WC   metoprolol tartrate  50 mg Oral BID   mouth rinse  15 mL Mouth Rinse 4 times per day   prednisoLONE acetate  1 drop Left Eye TID   rOPINIRole  0.25 mg Oral BID   sevelamer carbonate  1,600 mg Oral TID WC   valACYclovir  500 mg Oral QHS   Continuous Infusions:     LOS: 8 days    Time spent: 35 mins    Ramiro Harvest, MD [facility]   To contact the attending provider between 7A-7P or the covering provider during after hours 7P-7A, please log into the web site [email or web address] and  access using universal [place] password for that web site. If you do not have the password, please call the hospital operator.  09/23/2022, 9:38 AM

## 2022-09-23 NOTE — Progress Notes (Signed)
[facility] Progress Note   Subjective:    Seen and examined patient on HD. So far, tolerating treatment with UFG 2.5L. Denies SOB, palpitations, and CP.   Objective Vitals:   09/23/22 0800 09/23/22 0830 09/23/22 0902 09/23/22 0930  BP: (!) 167/74 92/81 115/82 114/70  Pulse: 94 98 98 91  Resp: 16 17 17  (!) 21  Temp:      TempSrc:      SpO2: 99% 100% 100% 100%  Weight:       Physical Exam General: Awake, alert, NAD Heart: Irregular, rate controlled Lungs: Clear ant Abdomen: soft and non-tender Extremities: no edema, R forearm and AC fossa with extensive ecchymosis, no warmth or fluctuance - looks a bit better, area marked Dialysis Access:  LUE AVF +t/b   Filed Weights   09/21/22 0332 09/22/22 0338 09/23/22 0600  Weight: 55.3 kg 56 kg 57.2 kg    Intake/Output Summary (Last 24 hours) at 09/23/2022 4098 Last data filed at 09/22/2022 1837 Gross per 24 hour  Intake --  Output 75 ml  Net -75 ml    Additional Objective Labs: Basic Metabolic Panel: Recent Labs  Lab 09/19/22 0302 09/20/22 0103 09/23/22 0801  NA 128* 130* 130*  K 4.1 3.7 3.9  CL 92* 94* 96*  CO2 22 23 21*  GLUCOSE 154* 118* 150*  BUN 30* 42* 66*  CREATININE 3.27* 3.91* 4.82*  CALCIUM 6.9* 7.0* 6.6*  PHOS 4.4 4.9* 5.5*   Liver Function Tests: Recent Labs  Lab 09/18/22 0250 09/19/22 0302 09/20/22 0103 09/23/22 0801  AST 12*  --   --   --   ALT 30  --   --   --   ALKPHOS 48  --   --   --   BILITOT 0.7  --   --   --   PROT 4.8*  --   --   --   ALBUMIN 2.1* 2.3* 2.2* 2.3*   No results for input(s): "LIPASE", "AMYLASE" in the last 168 hours. CBC: Recent Labs  Lab 09/17/22 0131 09/18/22 0250 09/19/22 0302 09/20/22 0103 09/23/22 0801  WBC 9.9 8.1 8.0 7.2 9.9  NEUTROABS  --  6.8  --   --   --   HGB 10.5* 9.4* 10.0* 10.5* 10.0*  HCT 32.7* 29.7* 31.0* 33.3* 31.7*  MCV 94.0 91.7 93.9 94.6 92.7  PLT 229 216 228 294 296   Blood Culture    Component Value Date/Time   SDES  BLOOD RIGHT ANTECUBITAL 01/22/2022 0038   SPECREQUEST  01/22/2022 0038    BOTTLES DRAWN AEROBIC AND ANAEROBIC Blood Culture results may not be optimal due to an inadequate volume of blood received in culture bottles   CULT  01/22/2022 0038    NO GROWTH 5 DAYS Performed at [facility], [address], [place], [place] [address]    REPTSTATUS 01/27/2022 FINAL 01/22/2022 0038    Cardiac Enzymes: No results for input(s): "CKTOTAL", "CKMB", "CKMBINDEX", "TROPONINI" in the last 168 hours. CBG: Recent Labs  Lab 09/22/22 0605 09/22/22 1216 09/22/22 1640 09/22/22 2107 09/23/22 0612  GLUCAP 127* 189* 156* 239* 117*   Iron Studies: No results for input(s): "IRON", "TIBC", "TRANSFERRIN", "FERRITIN" in the last 72 hours. Lab Results  Component Value Date   INR 1.0 03/14/2022   INR 1.4 (H) 01/22/2022   INR 1.2 11/01/2021   Studies/Results: No results found.  Medications:   amiodarone  100 mg Oral Daily   brinzolamide  1 drop Left Eye BID  And   brimonidine  1 drop Left Eye BID   Chlorhexidine Gluconate Cloth  6 each Topical Q0600   cinacalcet  30 mg Oral Q M,W,F-HD   erythromycin   Left Eye BID   insulin aspart  0-6 Units Subcutaneous TID WC   lipase/protease/amylase  36,000 Units Oral TID WC   metoprolol tartrate  50 mg Oral BID   mouth rinse  15 mL Mouth Rinse 4 times per day   prednisoLONE acetate  1 drop Left Eye TID   rOPINIRole  0.25 mg Oral BID   sevelamer carbonate  1,600 mg Oral TID WC   valACYclovir  500 mg Oral QHS    Dialysis Orders: [facility] MWF  3.5h  350/ 1.5  55kg  2/2 bath  AVF   Heparin none - last OP HD 7/26, post wt 57.5kg   - coming up 2-3kg over dry wt for last 3 wks - hectorol 5 mcg IV three times per week - mircera 75 mcg IV q 2 wks, last 7/17, due 7/31  Assessment/Plan: Afib w/ RVR - was on dilt gtt and po amiodarone, BB titrated and off gtt now.  Remains in a fib, rate controlled.  No anticoag due to bleeding risk AMS - likely due to  excessive dosing of the valacyclovir at 1 gm tid; the adjustment for esrd patient would be 500 mg daily. Held and rec'd dialysis x 2 then resumed at ESRD dose pm 7/31 as indication was Hsv keratitis vs zoster ophthalmicus  ESRD - on HD MWF, on HD HTN/ volume - euvolemic on exam, BP's normal.  Last tx standing post weight was 54.6kg - will need EDW adjusted at d/c. Anemia esrd - Hb 10-11 here, esa on 7/31. Follow.  MBD ckd - CCa in range, phos is high. Cont binder, renal diet and HD for high phos. Cont IV vdra, sensipar 30 tid.  DM2 - on insulin 8. RLS - increased ropinirole to BID from at bedtime on 8/1.  Salome Holmes, NP [facility] 09/23/2022,9:37 AM  LOS: 8 days

## 2022-09-23 NOTE — Progress Notes (Signed)
Mobility Specialist Progress Note:   09/23/22 1300  Mobility  Activity Ambulated with assistance in room;Transferred from bed to chair  Level of Assistance Contact guard assist, steadying assist  Assistive Device Front wheel walker  Distance Ambulated (ft) 15 ft  Activity Response Tolerated well  Mobility Referral Yes  $Mobility charge 1 Mobility  Mobility Specialist Start Time (ACUTE ONLY) 1339  Mobility Specialist Stop Time (ACUTE ONLY) 1345  Mobility Specialist Time Calculation (min) (ACUTE ONLY) 6 min    Pt received on EOB w/ NT, agreeable to mobility. MinG for STS. Asymptomatic throughout. Pt left in chair with call bell and NT present.  [place]ante Earlene Plater Mobility Specialist Please contact via Special educational needs teacher or Rehab office at [phone]

## 2022-09-23 NOTE — Progress Notes (Signed)
PT Cancellation Note  Patient Details Name: Kim Burns MRN: [id number] DOB: 07/17/1935   Cancelled Treatment:    Reason Eval/Treat Not Completed: (P) Fatigue/lethargy limiting ability to participate (Pt reporting fatigue from HD this morning and politely declines mobility. Will continue to follow per PT POC.)   Johny Shock 09/23/2022, 3:04 PM

## 2022-09-23 NOTE — Progress Notes (Signed)
   09/23/22 1210  Vitals  Temp (!) 96.9 F (36.1 C)  Pulse Rate (!) 104  Resp 12  BP (!) 139/59  SpO2 99 %  O2 Device Room Air  Oxygen Therapy  Patient Activity (if Appropriate) In bed  Pulse Oximetry Type Continuous  Oximetry Probe Site Changed No  Post Treatment  Dialyzer Clearance Lightly streaked  Duration of HD Treatment -hour(s) 3.5 hour(s)  Hemodialysis Intake (mL) 0 mL  Liters Processed 78.2  Fluid Removed (mL) 2500 mL  Tolerated HD Treatment Yes  AVG/AVF Arterial Site Held (minutes) 8 minutes  AVG/AVF Venous Site Held (minutes) 8 minutes   Received patient in bed to unit.  Alert and oriented.  Informed consent signed and in chart.   TX duration: 3.5hrs  Patient tolerated well.  Transported back to the room  Alert, without acute distress.  Hand-off given to patient's nurse.   Access used: Left AVF Access issues: none  Total UF removed: Medication(s) given: requip

## 2022-09-23 NOTE — Progress Notes (Signed)
Patient refused labs x2.  Discussed importance of labs, patient continues to refuse.

## 2022-09-24 ENCOUNTER — Other Ambulatory Visit (HOSPITAL_COMMUNITY): Payer: Self-pay

## 2022-09-24 DIAGNOSIS — G2581 Restless legs syndrome: Secondary | ICD-10-CM | POA: Diagnosis not present

## 2022-09-24 DIAGNOSIS — I4891 Unspecified atrial fibrillation: Secondary | ICD-10-CM | POA: Diagnosis not present

## 2022-09-24 DIAGNOSIS — N186 End stage renal disease: Secondary | ICD-10-CM | POA: Diagnosis not present

## 2022-09-24 DIAGNOSIS — I10 Essential (primary) hypertension: Secondary | ICD-10-CM | POA: Diagnosis not present

## 2022-09-24 LAB — GLUCOSE, CAPILLARY
Glucose-Capillary: 124 mg/dL — ABNORMAL HIGH (ref 70–99)
Glucose-Capillary: 177 mg/dL — ABNORMAL HIGH (ref 70–99)
Glucose-Capillary: 125 mg/dL — ABNORMAL HIGH (ref 70–99)

## 2022-09-24 MED ORDER — METOPROLOL TARTRATE 50 MG PO TABS
50.0000 mg | ORAL_TABLET | Freq: Two times a day (BID) | ORAL | 1 refills | Status: DC
  Filled 2022-09-24: qty 120, 60d supply, fill #0

## 2022-09-24 MED ORDER — ROPINIROLE HCL 0.25 MG PO TABS
0.2500 mg | ORAL_TABLET | Freq: Two times a day (BID) | ORAL | 1 refills | Status: DC
  Filled 2022-09-24: qty 60, 30d supply, fill #0

## 2022-09-24 MED ORDER — VALACYCLOVIR HCL 500 MG PO TABS
500.0000 mg | ORAL_TABLET | Freq: Every day | ORAL | 0 refills | Status: DC
  Filled 2022-09-24: qty 14, 14d supply, fill #0

## 2022-09-24 MED ORDER — PREDNISOLONE ACETATE 1 % OP SUSP
1.0000 [drp] | Freq: Two times a day (BID) | OPHTHALMIC | Status: DC
  Administered 2022-09-24: 1 [drp] via OPHTHALMIC

## 2022-09-24 MED ORDER — PREDNISOLONE ACETATE 1 % OP SUSP: 1.0000 [drp] | Freq: Two times a day (BID) | OPHTHALMIC | Status: DC

## 2022-09-24 MED ORDER — ERYTHROMYCIN 5 MG/GM OP OINT
TOPICAL_OINTMENT | Freq: Two times a day (BID) | OPHTHALMIC | 0 refills | Status: DC
  Filled 2022-09-24: qty 3.5, 30d supply, fill #0

## 2022-09-24 MED ORDER — MUSCLE RUB 10-15 % EX CREA: 1.0000 | TOPICAL_CREAM | CUTANEOUS | Status: DC | PRN

## 2022-09-24 MED ORDER — DICLOFENAC SODIUM 1 % EX GEL
2.0000 g | Freq: Three times a day (TID) | CUTANEOUS | 0 refills | Status: DC | PRN
  Filled 2022-09-24: qty 100, 16d supply, fill #0

## 2022-09-24 NOTE — Progress Notes (Signed)
Mobility Specialist Progress Note:   09/24/22 1549  Mobility  Activity Ambulated with assistance in hallway  Level of Assistance Contact guard assist, steadying assist  Assistive Device Four wheel walker  Distance Ambulated (ft) 120 ft  Activity Response Tolerated well  Mobility Referral Yes  $Mobility charge 1 Mobility  Mobility Specialist Start Time (ACUTE ONLY) 1538  Mobility Specialist Stop Time (ACUTE ONLY) 1549  Mobility Specialist Time Calculation (min) (ACUTE ONLY) 11 min   Pt received EOB, agreeable to mobility. Denied any feelings of pain or discomfort during ambulation, asymptomatic throughout. Pt returned to EOB with call bell in reach and all needs met.   Leory Plowman  Mobility Specialist Please contact via [employer] office at [phone]

## 2022-09-24 NOTE — TOC Progression Note (Addendum)
Transition of Care [facility]) - Progression Note    Patient Details  Name: Kim Burns MRN: [id number] Date of Birth: November 13, 1935  Transition of [facility]) CM/SW Contact  Leone Haven, RN Phone Number: 09/24/2022, 1:10 PM  Clinical Narrative:    Patient is set up by previous NCM for HHPT, HHOT, she will need a rollator .  NCM spoke with patient , she does not have a preference for the agency for the rollator, NCM made referral to [facility] with [employer] for the rollator, this will be delivered to patient's room.  Per MD added RN and aide  and SW to [facility] orders.  NCM contacted Tresa Endo with [facility] to notify of dc and to add additional services.  Tresa Endo is good with adding additional services.    Expected Discharge Plan: Skilled Nursing Facility Barriers to Discharge: Insurance Authorization, SNF Pending bed offer (Dialysis transport)  Expected Discharge Plan and Services       Living arrangements for the past 2 months: Single Family Home                           HH Arranged: OT, PT HH Agency: [facility] Date [facility] Agency Contacted: 09/18/22 Time HH Agency Contacted: 1018 Representative spoke with at [facility] Agency: Tresa Endo   Social Determinants of Health (SDOH) Interventions SDOH Screenings   Food Insecurity: No Food Insecurity (11/01/2021)  Housing: Low Risk  (11/01/2021)  Transportation Needs: No Transportation Needs (11/01/2021)  Utilities: Not At Risk (11/01/2021)  Tobacco Use: Low Risk  (09/14/2022)    Readmission Risk Interventions    08/27/2021   10:16 AM  Readmission Risk Prevention Plan  Transportation Screening Complete  PCP or Specialist Appt within 3-5 Days Complete  HRI or Home Care Consult Complete  Social Work Consult for Recovery Care Planning/Counseling Complete  Palliative Care Screening Not Applicable  Medication Review [employer]) Complete

## 2022-09-24 NOTE — Plan of Care (Signed)
Problem: Education: Goal: Ability to describe self-care measures that may prevent or decrease complications (Diabetes Survival Skills Education) will improve 09/24/2022 1639 by Anders Simmonds, RN Outcome: Adequate for Discharge 09/24/2022 1634 by Anders Simmonds, RN Outcome: Progressing Goal: Individualized Educational Video(s) 09/24/2022 1639 by Anders Simmonds, RN Outcome: Adequate for Discharge 09/24/2022 1634 by Anders Simmonds, RN Outcome: Progressing   Problem: Coping: Goal: Ability to adjust to condition or change in health will improve 09/24/2022 1639 by Anders Simmonds, RN Outcome: Adequate for Discharge 09/24/2022 1634 by Anders Simmonds, RN Outcome: Progressing   Problem: Fluid Volume: Goal: Ability to maintain a balanced intake and output will improve 09/24/2022 1639 by Anders Simmonds, RN Outcome: Adequate for Discharge 09/24/2022 1634 by Anders Simmonds, RN Outcome: Progressing   Problem: Health Behavior/Discharge Planning: Goal: Ability to identify and utilize available resources and services will improve 09/24/2022 1639 by Anders Simmonds, RN Outcome: Adequate for Discharge 09/24/2022 1634 by Anders Simmonds, RN Outcome: Progressing Goal: Ability to manage health-related needs will improve 09/24/2022 1639 by Anders Simmonds, RN Outcome: Adequate for Discharge 09/24/2022 1634 by Anders Simmonds, RN Outcome: Progressing   Problem: Metabolic: Goal: Ability to maintain appropriate glucose levels will improve 09/24/2022 1639 by Anders Simmonds, RN Outcome: Adequate for Discharge 09/24/2022 1634 by Anders Simmonds, RN Outcome: Progressing   Problem: Nutritional: Goal: Maintenance of adequate nutrition will improve 09/24/2022 1639 by Anders Simmonds, RN Outcome: Adequate for Discharge 09/24/2022 1634 by Anders Simmonds, RN Outcome: Progressing Goal: Progress toward achieving an optimal weight will improve 09/24/2022 1639 by Anders Simmonds, RN Outcome: Adequate for Discharge 09/24/2022  1634 by Anders Simmonds, RN Outcome: Progressing   Problem: Skin Integrity: Goal: Risk for impaired skin integrity will decrease 09/24/2022 1639 by Anders Simmonds, RN Outcome: Adequate for Discharge 09/24/2022 1634 by Anders Simmonds, RN Outcome: Progressing   Problem: Tissue Perfusion: Goal: Adequacy of tissue perfusion will improve 09/24/2022 1639 by Anders Simmonds, RN Outcome: Adequate for Discharge 09/24/2022 1634 by Anders Simmonds, RN Outcome: Progressing   Problem: Education: Goal: Knowledge of General Education information will improve Description: Including pain rating scale, medication(s)/side effects and non-pharmacologic comfort measures 09/24/2022 1639 by Anders Simmonds, RN Outcome: Adequate for Discharge 09/24/2022 1634 by Anders Simmonds, RN Outcome: Progressing   Problem: Health Behavior/Discharge Planning: Goal: Ability to manage health-related needs will improve 09/24/2022 1639 by Anders Simmonds, RN Outcome: Adequate for Discharge 09/24/2022 1634 by Anders Simmonds, RN Outcome: Progressing   Problem: Clinical Measurements: Goal: Ability to maintain clinical measurements within normal limits will improve 09/24/2022 1639 by Anders Simmonds, RN Outcome: Adequate for Discharge 09/24/2022 1634 by Anders Simmonds, RN Outcome: Progressing Goal: Will remain free from infection 09/24/2022 1639 by Anders Simmonds, RN Outcome: Adequate for Discharge 09/24/2022 1634 by Anders Simmonds, RN Outcome: Progressing Goal: Diagnostic test results will improve 09/24/2022 1639 by Anders Simmonds, RN Outcome: Adequate for Discharge 09/24/2022 1634 by Anders Simmonds, RN Outcome: Progressing Goal: Respiratory complications will improve 09/24/2022 1639 by Anders Simmonds, RN Outcome: Adequate for Discharge 09/24/2022 1634 by Anders Simmonds, RN Outcome: Progressing Goal: Cardiovascular complication will be avoided 09/24/2022 1639 by Anders Simmonds, RN Outcome: Adequate for Discharge 09/24/2022 1634  by Anders Simmonds, RN Outcome: Progressing   Problem: Activity: Goal: Risk for activity intolerance will decrease 09/24/2022 1639 by Anders Simmonds, RN Outcome: Adequate for Discharge  09/24/2022 1634 by Anders Simmonds, RN Outcome: Progressing   Problem: Nutrition: Goal: Adequate nutrition will be maintained 09/24/2022 1639 by Anders Simmonds, RN Outcome: Adequate for Discharge 09/24/2022 1634 by Anders Simmonds, RN Outcome: Progressing   Problem: Coping: Goal: Level of anxiety will decrease 09/24/2022 1639 by Anders Simmonds, RN Outcome: Adequate for Discharge 09/24/2022 1634 by Anders Simmonds, RN Outcome: Progressing   Problem: Elimination: Goal: Will not experience complications related to bowel motility 09/24/2022 1639 by Anders Simmonds, RN Outcome: Adequate for Discharge 09/24/2022 1634 by Anders Simmonds, RN Outcome: Progressing Goal: Will not experience complications related to urinary retention 09/24/2022 1639 by Anders Simmonds, RN Outcome: Adequate for Discharge 09/24/2022 1634 by Anders Simmonds, RN Outcome: Progressing   Problem: Pain Managment: Goal: General experience of comfort will improve 09/24/2022 1639 by Anders Simmonds, RN Outcome: Adequate for Discharge 09/24/2022 1634 by Anders Simmonds, RN Outcome: Progressing   Problem: Safety: Goal: Ability to remain free from injury will improve 09/24/2022 1639 by Anders Simmonds, RN Outcome: Adequate for Discharge 09/24/2022 1634 by Anders Simmonds, RN Outcome: Progressing   Problem: Skin Integrity: Goal: Risk for impaired skin integrity will decrease 09/24/2022 1639 by Anders Simmonds, RN Outcome: Adequate for Discharge 09/24/2022 1634 by Anders Simmonds, RN Outcome: Progressing

## 2022-09-24 NOTE — Plan of Care (Signed)

## 2022-09-24 NOTE — Progress Notes (Signed)
Physical Therapy Treatment Patient Details Name: Kim Burns MRN: [id number] DOB: 10/12/1935 Today's Date: 09/24/2022   History of Present Illness 87 yo F adm 09/13/22 for Afib with RVR. Daughter concerned about confusion and difficulty with speech on 7/27. MRI negative. Pt on higher dose of Valtrex than needed for a pt with renal failure and this resulted in confusion, lethargy, jerking etc. PMHx: - Afib, ESRD on HD, CHF, DM, HTN, Whipple, renal cell mass    PT Comments  Pt pleasant and reports she normally moves about the house throughout day, crochets or sews and then naps a few hours. Son is there all the time as he works from home but he usually only assists with housework, stair and HD transportation. Son on the phone reports he has had to carry her in after HD and encouraged use of WC to get to the house and education pt could even bump on stairs if needed. Pt was able to walk in hall and perform 9 stairs today with minguard assist and pt reports feeling stronger. Pt stating she has been to SNF 2x in the last year and that she moved better at home. Pt encouraged to continue to walk daily acutely and perform repeated STS. Pt and son agreeable to return home with rollator recommend for ease of mobility at home with fatigue.  sPO2 99% on RA   If plan is discharge home, recommend the following: A little help with walking and/or transfers;A little help with bathing/dressing/bathroom;Assistance with cooking/housework;Help with stairs or ramp for entrance;Assist for transportation   Can travel by private Psychologist, clinical (4 wheels)    Recommendations for Other Services       Precautions / Restrictions Precautions Precautions: Fall;Other (comment) Precaution Comments: nephrostomy drain     Mobility  Bed Mobility Overal bed mobility: Needs Assistance Bed Mobility: Supine to Sit, Sit to Supine     Supine to sit: HOB elevated, Supervision      General bed mobility comments: pt with use of rail and able to pivot to EOB without physical assist, HOB 35 degrees. Pt sleeps in recliner at home. Min assist to clear LLE onto surface with return to bed    Transfers Overall transfer level: Needs assistance   Transfers: Sit to/from Stand Sit to Stand: Min guard           General transfer comment: cues for hand placement    Ambulation/Gait Ambulation/Gait assistance: Min guard Gait Distance (Feet): 125 Feet Assistive device: Rolling walker (2 wheels) Gait Pattern/deviations: Step-through pattern, Decreased stride length, Trunk flexed   Gait velocity interpretation: 1.31 - 2.62 ft/sec, indicative of limited community ambulator   General Gait Details: cues for posture and proximity to [employer] Stairs: Yes Stairs assistance: Min guard Stair Management: Step to pattern, One rail Right Number of Stairs: 9 General stair comments: pt able to ascend/descend 9 steps with rail on right with both hands on rail, guarding for safety without need for physical assist   Wheelchair Mobility     Tilt Bed    Modified Rankin (Stroke Patients Only)       Balance Overall balance assessment: Needs assistance Sitting-balance support: No upper extremity supported, Feet supported Sitting balance-Leahy Scale: Fair Sitting balance - Comments: EOB without support   Standing balance support: Bilateral upper extremity supported, During functional activity, Reliant on assistive device for balance Standing balance-Leahy Scale: Poor Standing balance comment: RW for gait  Cognition Arousal/Alertness: Awake/alert Behavior During Therapy: WFL for tasks assessed/performed Overall Cognitive Status: Impaired/Different from baseline Area of Impairment: Memory                     Memory: Decreased short-term memory Following Commands: Follows one step commands consistently     Problem Solving:  Slow processing          Exercises      General Comments        Pertinent Vitals/Pain Pain Assessment Pain Assessment: No/denies pain    Home Living                          Prior Function            PT Goals (current goals can now be found in the care plan section) Progress towards PT goals: Progressing toward goals    Frequency    Min 1X/week      PT Plan Current plan remains appropriate    Co-evaluation              AM-PAC PT "6 Clicks" Mobility   Outcome Measure  Help needed turning from your back to your side while in a flat bed without using bedrails?: None Help needed moving from lying on your back to sitting on the side of a flat bed without using bedrails?: A Little Help needed moving to and from a bed to a chair (including a wheelchair)?: A Little Help needed standing up from a chair using your arms (e.g., wheelchair or bedside chair)?: A Little Help needed to walk in hospital room?: A Little Help needed climbing 3-5 steps with a railing? : A Little 6 Click Score: 19    End of Session Equipment Utilized During Treatment: Gait belt Activity Tolerance: Patient tolerated treatment well Patient left: in bed;with call bell/phone within reach;with nursing/sitter in room Nurse Communication: Mobility status PT Visit Diagnosis: Other abnormalities of gait and mobility (R26.89);Muscle weakness (generalized) (M62.81);Difficulty in walking, not elsewhere classified (R26.2)     Time: 9604-5409 PT Time Calculation (min) (ACUTE ONLY): 28 min  Charges:    $Gait Training: 8-22 mins $Therapeutic Activity: 8-22 mins PT General Charges $$ ACUTE PT VISIT: 1 Visit                     Merryl Hacker, PT Acute Rehabilitation Services Office: [phone]    Enedina Finner  09/24/2022, 1:38 PM

## 2022-09-24 NOTE — TOC Progression Note (Addendum)
Transition of Care [facility]) - Progression Note    Patient Details  Name: Kim Burns MRN: [id number] Date of Birth: 19-Nov-1935  [facility] CM/SW Contact  Mearl Latin, LCSW Phone Number: 09/24/2022, 11:47 AM  Clinical Narrative:    11:47am-patient requesting SNF placement. Referral completed. Will present bed offers as available and confirm that SNF can transport to dialysis outpatient.   1:08 PM Update: Patient planning for discharge home and not SNF. Home health services have been set up.    Expected Discharge Plan: Skilled Nursing Facility Barriers to Discharge: Insurance Authorization, SNF Pending bed offer (Dialysis transport)  Expected Discharge Plan and Services       Living arrangements for the past 2 months: Single Family Home                           HH Arranged: OT, PT HH Agency: [facility] Date [facility] Agency Contacted: 09/18/22 Time HH Agency Contacted: [phone] Representative spoke with at [facility] Agency: Tresa Endo   Social Determinants of Health (SDOH) Interventions SDOH Screenings   Food Insecurity: No Food Insecurity (11/01/2021)  Housing: Low Risk  (11/01/2021)  Transportation Needs: No Transportation Needs (11/01/2021)  Utilities: Not At Risk (11/01/2021)  Tobacco Use: Low Risk  (09/14/2022)    Readmission Risk Interventions    08/27/2021   10:16 AM  Readmission Risk Prevention Plan  Transportation Screening Complete  PCP or Specialist Appt within 3-5 Days Complete  HRI or Home Care Consult Complete  Social Work Consult for Recovery Care Planning/Counseling Complete  Palliative Care Screening Not Applicable  Medication Review Oceanographer) Complete

## 2022-09-24 NOTE — [place] FL2 (Signed)
[place] MEDICAID FL2 LEVEL OF CARE FORM     IDENTIFICATION  Patient Name: Kim Burns Birthdate: [birth year] Sex: female Admission Date (Current Location): 09/13/2022  [facility] and [place] Number:  Producer, television/film/video and Address:  [facility], [address] [place], [place] [address]      Provider Number: [id number]  Attending Physician Name and Address:  Rodolph Bong, MD  Relative Name and Phone Number:       Current Level of Care: Hospital Recommended Level of Care: Skilled Nursing Facility Prior Approval Number:    Date Approved/Denied:   PASRR Number: [id number]  Discharge Plan: SNF    Current Diagnoses: Patient Active Problem List   Diagnosis Date Noted   Hypomagnesemia 09/18/2022   RLS (restless legs syndrome) 09/18/2022   HSV (herpes simplex virus) dendritic keratitis 09/18/2022   Transaminitis 09/18/2022   Atrial fibrillation with RVR (HCC) 09/14/2022   Essential hypertension 09/14/2022   Erythropoietin (EPO) stimulating agent anemia management patient 07/16/2022   Hematoma of left kidney 01/22/2022   Kidney, perinephric abscess 11/02/2021   Bacteremia due to Streptococcus 11/01/2021   Lactic acidosis 10/30/2021   Fever 10/30/2021   Severe sepsis (HCC) 10/29/2021   Hydronephrosis of left kidney 10/29/2021   Chronic diastolic CHF (congestive heart failure) (HCC) 10/29/2021   Coronary artery disease involving native coronary artery of native heart without angina pectoris 10/29/2021   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) 10/29/2021   Chest pain of uncertain etiology    Pulmonary nodule 08/26/2021   Paroxysmal atrial fibrillation (HCC) 08/26/2021   End-stage renal disease on hemodialysis (HCC) 08/25/2021   Renal cell carcinoma of left kidney (HCC) 08/22/2021   DM2 (diabetes mellitus, type 2) (HCC) 11/14/2016   Hypokalemia 11/12/2016   Melena    History of anemia due to chronic kidney disease    Acute  renal failure (ARF) (HCC) 10/03/2016   GERD (gastroesophageal reflux disease) 04/29/2016   Hyperglycemia 04/29/2016   IPMN (intraductal papillary mucinous neoplasm) 04/23/2016   History of stroke 04/16/2016   Sepsis (HCC) 06/18/2013   Localized swelling, mass and lump, neck 03/29/2013   Sinus bradycardia 09/18/2012   Fatigue 09/18/2012    Orientation RESPIRATION BLADDER Height & Weight     Self, Time, Situation, Place  Normal Continent (Nephrostomy tube) Weight: 121 lb 4.8 oz (55 kg) Height:     BEHAVIORAL SYMPTOMS/MOOD NEUROLOGICAL BOWEL NUTRITION STATUS      Continent Diet (See dc summary)  AMBULATORY STATUS COMMUNICATION OF NEEDS Skin   Extensive Assist Verbally Normal                       Personal Care Assistance Level of Assistance  Bathing, Feeding, Dressing Bathing Assistance: Limited assistance Feeding assistance: Limited assistance Dressing Assistance: Limited assistance     Functional Limitations Info  Sight Sight Info: Impaired (glasses)        SPECIAL CARE FACTORS FREQUENCY  PT (By licensed PT), OT (By licensed OT)     PT Frequency: 5x/week OT Frequency: 5x/week            Contractures Contractures Info: Not present    Additional Factors Info  Code Status, Allergies, Insulin Sliding Scale Code Status Info: Full Allergies Info: Lopid (Gemfibrozil), Lotemax (Loteprednol Etabonate), Statins, Norco (Hydrocodone-acetaminophen), Other, Vibra-tab (Doxycycline), Amlodipine, Codeine, Hydrocodone, Hydrocodone-acetaminophen, Lisinopril, Verapamil   Insulin Sliding Scale Info: See dc summary       Current Medications (09/24/2022):  This is the  current hospital active medication list Current Facility-Administered Medications  Medication Dose Route Frequency Provider Last Rate Last Admin   acetaminophen (TYLENOL) tablet 650 mg  650 mg Oral Q6H PRN Howerter, Justin B, DO   650 mg at 09/23/22 0609   Or   acetaminophen (TYLENOL) suppository 650 mg  650 mg  Rectal Q6H PRN Howerter, Justin B, DO       amiodarone (PACERONE) tablet 100 mg  100 mg Oral Daily Howerter, Justin B, DO   100 mg at 09/24/22 0907   brinzolamide (AZOPT) 1 % ophthalmic suspension 1 drop  1 drop Left Eye BID Zigmund Daniel., MD   1 drop at 09/24/22 0857   And   brimonidine (ALPHAGAN) 0.2 % ophthalmic solution 1 drop  1 drop Left Eye BID Zigmund Daniel., MD   1 drop at 09/24/22 0902   Chlorhexidine Gluconate Cloth 2 % PADS 6 each  6 each Topical Q0600 Delano Metz, MD   6 each at 09/24/22 0500   cinacalcet (SENSIPAR) tablet 30 mg  30 mg Oral Q M,W,F-HD Tyler Pita, MD   30 mg at 09/23/22 1254   diclofenac Sodium (VOLTAREN) 1 % topical gel 2 g  2 g Topical TID PRN Rodolph Bong, MD   2 g at 09/22/22 2152   erythromycin ophthalmic ointment   Left Eye BID Zigmund Daniel., MD   Given at 09/24/22 0910   guaiFENesin-dextromethorphan (ROBITUSSIN DM) 100-10 MG/5ML syrup 5 mL  5 mL Oral Q4H PRN Rodolph Bong, MD   5 mL at 09/23/22 0305   insulin aspart (novoLOG) injection 0-6 Units  0-6 Units Subcutaneous TID WC Howerter, Justin B, DO   1 Units at 09/23/22 1757   lipase/protease/amylase (CREON) capsule 36,000 Units  36,000 Units Oral TID WC Zigmund Daniel., MD   36,000 Units at 09/24/22 0700   melatonin tablet 3 mg  3 mg Oral QHS PRN Howerter, Justin B, DO   3 mg at 09/23/22 2106   metoprolol tartrate (LOPRESSOR) tablet 50 mg  50 mg Oral BID Rodolph Bong, MD   50 mg at 09/24/22 1610   Muscle Rub CREA   Topical PRN Rodolph Bong, MD   1 Application at 09/20/22 1101   ondansetron (ZOFRAN) injection 4 mg  4 mg Intravenous Q6H PRN Howerter, Justin B, DO       Oral care mouth rinse  15 mL Mouth Rinse 4 times per day Rodolph Bong, MD   15 mL at 09/24/22 0800   Oral care mouth rinse  15 mL Mouth Rinse PRN Rodolph Bong, MD       prednisoLONE acetate (PRED FORTE) 1 % ophthalmic suspension 1 drop  1 drop Left Eye BID Sinda Du,  MD   1 drop at 09/24/22 0906   rOPINIRole (REQUIP) tablet 0.25 mg  0.25 mg Oral BID Tyler Pita, MD   0.25 mg at 09/24/22 0900   sevelamer carbonate (RENVELA) tablet 1,600 mg  1,600 mg Oral TID WC Howerter, Justin B, DO   1,600 mg at 09/24/22 0900   valACYclovir (VALTREX) tablet 500 mg  500 mg Oral QHS Zigmund Daniel., MD   500 mg at 09/23/22 2103     Discharge Medications: Please see discharge summary for a list of discharge medications.  Relevant Imaging Results:  Relevant Lab Results:   Additional Information SS# [id number]. Pt receives out[facility]pt HD at [facility] on MWF. Pt arrives  at 6:20 am for 6:40 am chair time  Mearl Latin, LCSW

## 2022-09-24 NOTE — Discharge Summary (Signed)
Physician Discharge Summary  Kim Burns EXB:[id number] DOB: 1936-01-19 DOA: 09/13/2022  PCP: Lewis Moccasin, MD  Admit date: 09/13/2022 Discharge date: 09/24/2022  Time spent: 60 minutes  Recommendations for Outpatient Follow-up:  Follow-up with Dr. Cathey Endow, ophthalmology in 1 to 2 weeks. Follow-up with Lewis Moccasin, MD in 2 weeks.  On follow-up patient will need a basic metabolic profile, magnesium level checked to follow-up on electrolytes and renal function.  Patient's chronic medical issues will need to be followed up upon. Follow-up for regular hemodialysis center as scheduled on Wednesday, 09/25/2022 Follow-up with Dr. Peter Swaziland, cardiology in 2 weeks for follow-up on A-fib.   Discharge Diagnoses:  Principal Problem:   Atrial fibrillation with RVR (HCC) Active Problems:   End-stage renal disease on hemodialysis (HCC)   DM2 (diabetes mellitus, type 2) (HCC)   Chronic diastolic CHF (congestive heart failure) (HCC)   History of anemia due to chronic kidney disease   Essential hypertension   Hypomagnesemia   RLS (restless legs syndrome)   HSV (herpes simplex virus) dendritic keratitis   Transaminitis   Discharge Condition: Stable and improved.  Diet recommendation: Regular  Filed Weights   09/22/22 0338 09/23/22 0600 09/24/22 0330  Weight: 56 kg 57.2 kg 55 kg    History of present illness:  HPI per Dr. Norlene Campbell Kim Burns is a 87 y.o. female with medical history significant for paroxysmal atrial fibrillation, end-stage renal disease on hemodialysis on Monday, Wednesday, Friday schedule, type 2 diabetes mellitus, essential hypertension, chronic diastolic heart failure, who is admitted to [facility] on 09/13/2022 with atrial fibrillation with RVR after presenting from home to [facility] ED complaining of palpitations.    The patient presents with 1 day of palpitations, which she states started after completion of her routine scheduled hemodialysis session  on Friday, 09/13/2022.  She denies any associated chest pain, shortness of breath, diaphoresis, dizziness, presyncope, or syncope.  She also denies any recent cough, rhinitis, rhinorrhea, nor any recent subjective fever, chills, rigors, or generalized myalgias.  No recent headache, neck stiffness, rash, abdominal pain, or diarrhea.   She has a documented history of paroxysmal atrial fibrillation.  She was previously anticoagulated on Eliquis, but this was held in the context of a bleeding history, including history of gastrointestinal bleed in September 2023.  At this time, she is not on any formal anticoagulation as an outpatient.  Follows with Dr. Swaziland as her outpatient cardiologist.  AV nodal blocking regimen as an outpatient is noted to include Coreg.   Most recent echocardiogram occurred in July 2023 and was notable for LVEF 60 to 65%, no evidence of wall motion or maladies, grade 3 diastolic dysfunction, severely dilated bilateral atria, mild mitral regurgitation, mild to moderate tricuspid regurgitation, moderately reduced right ventricular systolic function.       ED Course:  Vital signs in the ED were notable for the following: Afebrile; initial heart rates in the 160s, subsequently decreasing into the 120s to 130s following initiation of diltiazem drip, as further detailed below; systolic blood pressures in the 130s to 150s; respiratory rate 17-23, oxygen saturation 96 to 100% on room air.   Labs were notable for the following: BMP notable for sodium 136, potassium 3.6, bicarbonate 26, anion gap 15, glucose 167.  Serum magnesium level 2.4.  CBC notable for white blood cell count 10,000, hemoglobin 10.8 associated normocytic/normochromic properties and relative demonstration prior hemoglobin data point of 10.2 on 06/21/2022.   Per my interpretation, EKG in ED  demonstrated the following: Atrial fibrillation with RVR, with heart rate 164, and no evidence of T wave or ST changes, including no  evidence of ST elevation.   Imaging in the ED, per corresponding formal radiology read, was notable for the following: 1 view chest x-ray shows atelectasis versus infiltrate in the right middle and upper lungs, in the absence of any evidence of edema, effusion, or pneumothorax.   While in the ED, the following were administered: Diltiazem 10 mg IV x 1 dose followed by initiation of diltiazem drip.   Subsequently, the patient was admitted for further evaluation management of presenting atrial fibrillation with RVR.   Hospital Course:  #1 A-fib with with RVR -Unclear etiology.  -Chest x-ray with atelectasis versus infiltrates right mid and upper lung. -Patient noted to have converted on amiodarone without recurrence after ablation procedure July 2023 and was on low-dose amiodarone and Coreg prior to admission. -Heart rate improved, on metoprolol and amiodarone. -Coreg substituted and for metoprolol.  -It is noted that the patient is not a anticoagulation candidate after discussion with cardiology in the outpatient setting and it was felt that bleeding risk outweighed benefit of stroke prevention based on shared decision making with cardiology. -Heart rates was controlled early on in the hospitalization on IV diltiazem gtt., metoprolol, amiodarone. -Diltiazem drip was subsequently discontinued. -Heart rate remained controlled on metoprolol 50 mg twice daily as well as amiodarone during the hospitalization. -Electrolytes were repleted. -Cardiology consulted on patient and subsequently signed off. -Outpatient follow-up with primary cardiologist, Dr. Swaziland in 2 weeks.   2.  Acute metabolic encephalopathy/myoclonus -Felt likely secondary to inappropriate Valtrex dosing and patient with ESRD. -It is noted that Valtrex can cause myoclonus, delirium, encephalopathy, seizures and likely cause of patient's encephalopathy and myoclonic jerks. -Patient improved clinically initially with holding Valtrex  and subsequently renal dosing needed.   -Mental status was close to baseline by day of discharge.   -MRI head negative for any acute abnormalities. -EEG done with no seizure-like activity noted but excess low voltage fast activity consistent with medication effect, mild to moderate generalized slowing of background consistent with mild to moderate degree of diffusion or multifocal cerebral dysfunction. -Valtrex restarted at renal dosing of 500 mg daily. -Outpatient follow-up.   3.  ESRD on HD/volume overload/bilateral lower extremity edema -Volume status being managed by HD.  -Per nephrology.   -Outpatient follow-up at regular HD center on 09/25/2022.   4.  Diastolic CHF/moderately reduced RV SF/moderately elevated PASP -2D echo done with EF of 55 to 60%, NWMA, mildly reduced right ventricular systolic function, severely elevated PASP, severely dilated left atrial size, mild to moderate dilated right atrial size, mild MVR, moderate TVR. -On hemodialysis which is managing volume status.   5.  Prolonged QTc -Electrolytes repleted. -Repeat EKG with resolution of QTc prolongation.   -Avoid QT prolonging medications.   6.  Hypomagnesemia -Repleted during the hospitalization.    7.  HSV keratitis vs zoster ophthalmicus -Valtrex redosed renally. -Dr. Lowell Guitar discussed with ophthalmologist, Dr. Sinda Du on 7/30 (office [phone] )who recommended immediate outpatient follow-up posthospitalization. -Patient was maintained on prednisolone acetate ophthalmic suspension, brinzolamide ophthalmic suspension, erythromycin ointment.  Okay with stopping neomycin polymyxin B dexamethasone ophthalmic suspension. -Patient was seen in formal consultation by Dr Cathey Endow on 09/24/2022, who recommended continuation of Valtrex per renal dose, continuation of brinzolamide and brimonidine twice daily, continuation of erythromycin ointment and prednisolone was decreased to twice daily. -Dr. Cathey Endow, recommended  outpatient follow-up in 1 to  2 weeks.   8.  Diabetes mellitus type 2, well-controlled -Hemoglobin A1c 5.8 (09/14/2022) -Diet controlled as per med rec patient not on any diabetic medications prior to admission. -Patient maintained on SSI during the hospitalization will follow-up with PCP in the outpatient setting.   9.  Hypertension -Noted to be on Coreg prior to admission however uses midodrine on HD days. -Coreg substituted for metoprolol for better rate control which patient will be discharged on..    10.  Anemia of chronic disease -Remained stable during the hospitalization.    11.  RLS -Patient noted to have been on Requip prior to admission and patient placed on Requip 2.5 mg twice daily during the hospitalization with clinical improvement.   -Outpatient follow-up.    12.  Transaminitis -Resolved.   13.  Right upper extremity IV infiltration -Patient noted to have IV infiltration of the right upper extremity with significant ecchymosis on the right forearm. -Ice as well as upper extremity was kept elevated during the hospitalization with clinical improvement. -Outpatient follow-up.   14.  Hyponatremia -Likely secondary to hypervolemic hyponatremia -Improved with hemodialysis.  -Outpatient follow-up in hemodialysis center.      Procedures: Chest x-ray 09/14/2022 MRI brain 09/14/2022 2D echo 09/15/2022 EEG 09/15/2022  Consultations:With ophthalmologyy: Dr. Sinda Du, MD 09/24/2022 Cardiology: Dr. Jacques Navy 09/16/2022 Nephrology: Dr. Malachi Bonds 09/15/2022  Discharge Exam: Vitals:   09/24/22 0907 09/24/22 1126  BP: (!) 144/55 (!) 147/69  Pulse: 97   Resp: 19 18  Temp:  (!) 97.5 F (36.4 C)  SpO2:      General: NAD. Cardiovascular: Irregularly irregular.  No JVD.  No lower extremity edema. Respiratory: Clear to auscultation bilaterally.  No wheezes, no crackles, no rhonchi.  Fair air movement.  Speaking in full sentences.  Discharge Instructions   Discharge  Instructions     Diet general   Complete by: As directed    Increase activity slowly   Complete by: As directed       Allergies as of 09/24/2022       Reactions   Lopid [gemfibrozil] Other (See Comments)   Myalgia    Lotemax [loteprednol Etabonate] Rash   Skin burning  Blurry vision   Statins Other (See Comments)   Rosuvastatin Muscle weakness   Norco [hydrocodone-acetaminophen] Other (See Comments)   Headaches  Tolerates acetaminophen    Other Diarrhea   Real Butter - diarrhea   Vibra-tab [doxycycline] Other (See Comments)   Unknown reaction   Amlodipine Other (See Comments)   Norvasc   Codeine Other (See Comments)   headache   Hydrocodone Other (See Comments)   Hydrocodone-acetaminophen Other (See Comments)   Headache. Tolerates acetaminophen.   Lisinopril Other (See Comments)   Verapamil Other (See Comments)        Medication List     STOP taking these medications    carvedilol 3.125 MG tablet Commonly known as: COREG   ciprofloxacin 500 MG tablet Commonly known as: CIPRO   furosemide 20 MG tablet Commonly known as: LASIX   neomycin-polymyxin b-dexamethasone 3.5-10000-0.1 Susp Commonly known as: MAXITROL       TAKE these medications    acetaminophen 325 MG tablet Commonly known as: TYLENOL Take 2 tablets (650 mg total) by mouth every 6 (six) hours as needed for mild pain, fever or headache. What changed: how much to take   amiodarone 200 MG tablet Commonly known as: Pacerone Take 0.5 tablets (100 mg total) by mouth daily. Please keep appointment for further refills  Brinzolamide-Brimonidine 1-0.2 % Susp Apply 1 drop to eye 2 (two) times daily.   colestipol 1 g tablet Commonly known as: COLESTID Take 1 g by mouth See admin instructions. 1 tablet (1g) prior to dialysis M-W-F + 1 tablet daily PRN diarrhea   Creon 36000 UNITS Cpep capsule Generic drug: lipase/protease/amylase Take 36,000 Units by mouth 2 (two) times daily with a meal.    DIALYVITE 800 WITH ZINC 0.8 MG Tabs Take 1 tablet by mouth daily.   diclofenac sodium 1 % Gel Commonly known as: VOLTAREN Apply 2 g topically 4 (four) times daily as needed (for hand pain). What changed: Another medication with the same name was added. Make sure you understand how and when to take each.   diclofenac Sodium 1 % Gel Commonly known as: VOLTAREN Apply 2 g topically 3 (three) times daily as needed (pain). What changed: You were already taking a medication with the same name, and this prescription was added. Make sure you understand how and when to take each.   erythromycin ophthalmic ointment Place into the left eye 2 (two) times daily.   folic acid 1 MG tablet Commonly known as: FOLVITE Take 1 tablet (1 mg total) by mouth daily.   insulin aspart 100 UNIT/ML injection Commonly known as: novoLOG Inject 0-6 Units into the skin 3 (three) times daily with meals. What changed:  when to take this additional instructions   lidocaine-prilocaine cream Commonly known as: EMLA Apply 1 Application topically every Monday, Wednesday, and Friday with hemodialysis.   metoprolol tartrate 50 MG tablet Commonly known as: LOPRESSOR Take 1 tablet (50 mg total) by mouth 2 (two) times daily.   midodrine 10 MG tablet Commonly known as: PROAMATINE Take 10 mg on dialysis days Mon-Wed-Fri   Muscle Rub 10-15 % Crea Apply 1 Application topically as needed for muscle pain.   Pataday 0.1 % ophthalmic solution Generic drug: olopatadine Place 1 drop into both eyes 2 (two) times daily.   prednisoLONE acetate 1 % ophthalmic suspension Commonly known as: PRED FORTE Place 1 drop into the left eye 2 (two) times daily. What changed: when to take this   Restasis 0.05 % ophthalmic emulsion Generic drug: cycloSPORINE Place 1 drop into both eyes as needed (yes irratation).   rOPINIRole 0.25 MG tablet Commonly known as: REQUIP Take 1 tablet (0.25 mg total) by mouth 2 (two) times  daily. What changed:  how much to take when to take this   Sensipar 30 MG tablet Generic drug: cinacalcet Take 30 mg by mouth with breakfast, with lunch, and with evening meal.   sevelamer carbonate 800 MG tablet Commonly known as: RENVELA Take 1,600 mg by mouth 3 (three) times daily with meals.   valACYclovir 500 MG tablet Commonly known as: VALTREX Take 1 tablet (500 mg total) by mouth at bedtime. What changed:  medication strength how much to take when to take this   VITAMIN D-3 PO Take 1 capsule by mouth every Monday, Wednesday, and Friday with hemodialysis.               Durable Medical Equipment  (From admission, onward)           Start     Ordered   09/24/22 1305  For home use only DME 4 wheeled rolling walker with seat  Once       Question Answer Comment  Patient needs a walker to treat with the following condition Weakness   Patient needs a walker to treat with the  following condition Weakness generalized      09/24/22 1306           Allergies  Allergen Reactions   Lopid [Gemfibrozil] Other (See Comments)    Myalgia    Lotemax [Loteprednol Etabonate] Rash    Skin burning  Blurry vision   Statins Other (See Comments)    Rosuvastatin Muscle weakness   Norco [Hydrocodone-Acetaminophen] Other (See Comments)    Headaches  Tolerates acetaminophen    Other Diarrhea    Real Butter - diarrhea   Vibra-Tab [Doxycycline] Other (See Comments)    Unknown reaction   Amlodipine Other (See Comments)    Norvasc   Codeine Other (See Comments)    headache   Hydrocodone Other (See Comments)   Hydrocodone-Acetaminophen Other (See Comments)    Headache. Tolerates acetaminophen.   Lisinopril Other (See Comments)   Verapamil Other (See Comments)    Follow-up Information     [facility] Follow up.   Specialty: Home Health Services Why: Home health has been arranged. They will contact you to schedule apt. Contact information: [address] [place] [address] [phone]         [facility] Follow up.   Why: rollator Contact information: [phone]        Lewis Moccasin, MD. Schedule an appointment as soon as possible for a visit in 2 week(s).   Specialty: Family Medicine Contact information: [address] [place] [address] [phone]         Hemodialysis center Follow up on 09/25/2022.          Sw[place]iland, Peter M, MD. Schedule an appointment as soon as possible for a visit in 2 week(s).   Specialty: Cardiology Contact information: [address] [place] [address] [phone]         Sinda Du, MD. Schedule an appointment as soon as possible for a visit in 1 week(s).   Specialty: Ophthalmology Why: f/u in 1-2 weeks. Contact information: [address] [place] [address] [phone]                  The results of significant diagnostics from this hospitalization (including imaging, microbiology, ancillary and laboratory) are listed below for reference.    Significant Diagnostic Studies: DG Knee Complete 4 Views Right  Result Date: 09/16/2022 CLINICAL DATA:  Chronic bilateral knee pain.  OA bilateral knees. EXAM: RIGHT KNEE - COMPLETE 4+ VIEW; LEFT KNEE - COMPLETE 4+ VIEW COMPARISON:  None Available. FINDINGS: The bones are subjectively under mineralized. Right knee: Mild patellofemoral joint space narrowing. Trace peripheral spurring. No fracture. No erosive change. There is chondrocalcinosis. Mild generalized subcutaneous edema. Peripheral vascular calcifications are seen. No definite joint effusion. Left knee: Medial tibiofemoral joint space narrowing. Mild tricompartmental peripheral spurring. Small joint effusion. No fracture. Faint chondrocalcinosis. No erosive change. Generalized subcutaneous edema. IMPRESSION: 1. Mild tricompartmental osteoarthritis of both knees, left greater than right. 2. Chondrocalcinosis. 3. Small left joint effusion.  4. Osteopenia/osteoporosis. 5. Generalized subcutaneous edema. Electronically Signed   By: Narda Rutherford M.D.   On: 09/16/2022 12:02   DG Knee Complete 4 Views Left  Result Date: 09/16/2022 CLINICAL DATA:  Chronic bilateral knee pain.  OA bilateral knees. EXAM: RIGHT KNEE - COMPLETE 4+ VIEW; LEFT KNEE - COMPLETE 4+ VIEW COMPARISON:  None Available. FINDINGS: The bones are subjectively under mineralized. Right knee: Mild patellofemoral joint space narrowing. Trace peripheral spurring. No fracture. No erosive change. There is chondrocalcinosis.  Mild generalized subcutaneous edema. Peripheral vascular calcifications are seen. No definite joint effusion. Left knee: Medial tibiofemoral joint space narrowing. Mild tricompartmental peripheral spurring. Small joint effusion. No fracture. Faint chondrocalcinosis. No erosive change. Generalized subcutaneous edema. IMPRESSION: 1. Mild tricompartmental osteoarthritis of both knees, left greater than right. 2. Chondrocalcinosis. 3. Small left joint effusion. 4. Osteopenia/osteoporosis. 5. Generalized subcutaneous edema. Electronically Signed   By: Narda Rutherford M.D.   On: 09/16/2022 12:02   ECHOCARDIOGRAM COMPLETE  Result Date: 09/15/2022    ECHOCARDIOGRAM REPORT   Patient Name:   Kim Burns Date of Exam: 09/15/2022 Medical Rec #:  [id number]        Height:       62.0 in Accession #:    [id number]       Weight:       116.4 lb Date of Birth:  February 06, 1936        BSA:          1.519 m Patient Age:    87 years         BP:           158/89 mmHg Patient Gender: F                HR:           88 bpm. Exam Location:  Inpatient Procedure: 2D Echo, Cardiac Doppler and Color Doppler Indications:    Atrial fibrillation  History:        Patient has prior history of Echocardiogram examinations, most                 recent 08/27/2021. CHF, end stage renal disease; Risk                 Factors:Diabetes and Hypertension.  Sonographer:    [name] RDCS Referring Phys:  [phone] A CALDWELL POWELL JR  Sonographer Comments: Tremors. IMPRESSIONS  1. Left ventricular ejection fraction, by estimation, is 55 to 60%. The left ventricle has normal function. The left ventricle has no regional wall motion abnormalities. Left ventricular diastolic parameters are indeterminate.  2. Right ventricular systolic function is mildly reduced. The right ventricular size is normal. There is severely elevated pulmonary artery systolic pressure.  3. Left atrial size was severely dilated.  4. Right atrial size was mild to moderately dilated.  5. The mitral valve is degenerative. Mild mitral valve regurgitation. Moderate mitral annular calcification.  6. Tricuspid valve regurgitation is moderate.  7. The aortic valve is tricuspid. Aortic valve regurgitation is not visualized.  8. The inferior vena cava is normal in size with greater than 50% respiratory variability, suggesting right atrial pressure of 3 mmHg. FINDINGS  Left Ventricle: Left ventricular ejection fraction, by estimation, is 55 to 60%. The left ventricle has normal function. The left ventricle has no regional wall motion abnormalities. The left ventricular internal cavity size was normal in size. There is  no left ventricular hypertrophy. Left ventricular diastolic parameters are indeterminate. Right Ventricle: The right ventricular size is normal. Right ventricular systolic function is mildly reduced. There is severely elevated pulmonary artery systolic pressure. The tricuspid regurgitant velocity is 3.90 m/s, and with an assumed right atrial pressure of 3 mmHg, the estimated right ventricular systolic pressure is 63.8 mmHg. Left Atrium: Left atrial size was severely dilated. Right Atrium: Right atrial size was mild to moderately dilated. Pericardium: There is no evidence of pericardial effusion. Mitral Valve: The mitral valve is degenerative in appearance. Moderate mitral annular calcification. Mild mitral  valve regurgitation. Tricuspid Valve:  The tricuspid valve is normal in structure. Tricuspid valve regurgitation is moderate. Aortic Valve: The aortic valve is tricuspid. Aortic valve regurgitation is not visualized. Pulmonic Valve: The pulmonic valve was normal in structure. Pulmonic valve regurgitation is not visualized. Aorta: The aortic root and ascending aorta are structurally normal, with no evidence of dilitation. Venous: The inferior vena cava is normal in size with greater than 50% respiratory variability, suggesting right atrial pressure of 3 mmHg. IAS/Shunts: No atrial level shunt detected by color flow Doppler.  LEFT VENTRICLE PLAX 2D LVIDd:         4.40 cm   Diastology LVIDs:         3.30 cm   LV e' medial:  6.74 cm/s LV PW:         0.90 cm   LV e' lateral: 9.03 cm/s LV IVS:        1.00 cm LVOT diam:     1.80 cm LV SV:         45 LV SV Index:   29 LVOT Area:     2.54 cm  RIGHT VENTRICLE            IVC RV Basal diam:  3.00 cm    IVC diam: 1.90 cm RV S prime:     8.81 cm/s TAPSE (M-mode): 1.1 cm LEFT ATRIUM             Index        RIGHT ATRIUM           Index LA diam:        3.90 cm 2.57 cm/m   RA Area:     16.70 cm LA Vol (A2C):   95.7 ml 63.01 ml/m  RA Volume:   40.70 ml  26.80 ml/m LA Vol (A4C):   77.1 ml 50.76 ml/m LA Biplane Vol: 86.8 ml 57.15 ml/m  AORTIC VALVE LVOT Vmax:   85.10 cm/s LVOT Vmean:  57.500 cm/s LVOT VTI:    0.175 m  AORTA Ao Root diam: 3.00 cm Ao Asc diam:  2.90 cm TRICUSPID VALVE TR Peak grad:   60.8 mmHg TR Vmax:        390.00 cm/s  SHUNTS Systemic VTI:  0.18 m Systemic Diam: 1.80 cm Carolan Clines Electronically signed by Carolan Clines Signature Date/Time: 09/15/2022/1:17:18 PM    Final    MR BRAIN WO CONTRAST  Result Date: 09/14/2022 CLINICAL DATA:  Acute neurologic deficit EXAM: MRI HEAD WITHOUT CONTRAST TECHNIQUE: Multiplanar, multiecho pulse sequences of the brain and surrounding structures were obtained without intravenous contrast. COMPARISON:  None Available. FINDINGS: Brain: No acute infarct, mass effect  or extra-axial collection. Unchanged focus of magnetic susceptibility effect within the right parietal white matter, possibly a cavernous malformation or remote hemorrhage. There is multifocal hyperintense T2-weighted signal within the white matter. Parenchymal volume and CSF spaces are normal. Old, small bilateral occipital and cerebellar infarcts. The midline structures are normal. Vascular: Major flow voids are preserved. Skull and upper cervical spine: Normal calvarium and skull base. Visualized upper cervical spine and soft tissues are normal. Sinuses/Orbits:No paranasal sinus fluid levels or advanced mucosal thickening. No mastoid or middle ear effusion. Normal orbits. IMPRESSION: 1. No acute intracranial abnormality. 2. Chronic small vessel ischemia and multiple old small vessel infarcts. Electronically Signed   By: Deatra Robinson M.D.   On: 09/14/2022 21:28   DG Chest Portable 1 View  Result Date: 09/14/2022 CLINICAL DATA:  Palpitations EXAM: PORTABLE CHEST 1  VIEW COMPARISON:  Radiographs 08/09/2022 FINDINGS: Stable cardiomegaly. Aortic atherosclerotic calcification. Chronic bronchitic changes. Atelectasis or infiltrates in the right mid to upper lung. Right basilar atelectasis. No pleural effusion or pneumothorax. No displaced rib fractures. IMPRESSION: Atelectasis or infiltrates in the right mid/upper lung. Electronically Signed   By: Minerva Fester M.D.   On: 09/14/2022 00:20   IR NEPHROSTOMY EXCHANGE LEFT  Result Date: 08/27/2022 INDICATION: History of left-sided renal cell carcinoma, post endovascular embolization and microwave ablation on 08/22/2021. Postprocedural course complicated by development of a delayed urine leak which ultimately failed percutaneous management with placement of a double-J ureteral stent as well as the perinephric drainage catheter, currently with urine leak being managed via a chronic left-sided nephrostomy catheter. Patient presents today for routine nephrostomy  catheter exchange. Patient reports a proximally 100 cc of urine output from the nephrostomy catheter per day. She states that she has noticed some rather significant hematuria within nephrostomy tube earlier today. She is otherwise without complaint. Specifically complaining of fever or chills. EXAM: FLUOROSCOPIC GUIDED LEFT SIDED NEPHROSTOMY CATHETER EXCHANGE COMPARISON:  Fluoroscopic guided nephrostomy catheter exchange and up sizing-06/27/2022 CONTRAST:  15 cc Omnipaque 300 administered into the collecting system FLUOROSCOPY TIME:  54 seconds (5 mGy) COMPLICATIONS: None immediate. TECHNIQUE: Informed written consent was obtained from the patient after a discussion of the risks, benefits and alternatives to treatment. Questions regarding the procedure were encouraged and answered. A timeout was performed prior to the initiation of the procedure. The left flank and external portion of existing nephrostomy catheter were prepped and draped in the usual sterile fashion. A sterile drape was applied covering the operative field. Maximum barrier sterile technique with sterile gowns and gloves were used for the procedure. A timeout was performed prior to the initiation of the procedure. A pre procedural spot fluoroscopic image was obtained after contrast was injected via the existing nephrostomy catheter demonstrating appropriate positioning within the renal pelvis. The existing nephrostomy catheter was cut and cannulated with a Benson wire which was coiled within the renal pelvis. Under intermittent fluoroscopic guidance, the existing nephrostomy catheter was exchanged for a new 10.2 [place] all-purpose drainage catheter. Contrast injection confirmed appropriate positioning within the renal pelvis and a post exchange fluoroscopic image was obtained. The catheter was locked and secured to the skin with a suture. A dressing was placed. The patient tolerated the procedure well without immediate postprocedural complication.  FINDINGS: Preprocedural spot fluoroscopic image demonstrates stable positioning of left-sided nephrostomy catheter. Embolization coils again overlie the inferior pole of the left kidney. The existing nephrostomy catheter is appropriately positioned and functioning. Contrast injection demonstrates a minimal amount of clot within the left renal collecting system. After successful fluoroscopic guided exchange, the new nephrostomy catheter is coiled and locked within the left renal pelvis. IMPRESSION: Successful fluoroscopic guided exchange of left sided 10.2 French percutaneous nephrostomy catheter. PLAN: Given recent development of hematuria, patient was instructed to flush the nephrostomy catheter with 10 cc of normal saline until hematuria resolves. Once hematuria resolves, she no longer needs to flush the nephrostomy catheter. Otherwise, patient will return for routine fluoroscopic guided nephrostomy catheter exchange in 8 weeks. Electronically Signed   By: Simonne Come M.D.   On: 08/27/2022 08:31    Microbiology: No results found for this or any previous visit (from the past 240 hour(s)).   Labs: Basic Metabolic Panel: Recent Labs  Lab 09/18/22 0250 09/19/22 0302 09/20/22 0103 09/23/22 0801  NA 133* 128* 130* 130*  K 3.4* 4.1 3.7 3.9  CL 94* 92* 94* 96*  CO2 27 22 23  21*  GLUCOSE 172* 154* 118* 150*  BUN 16 30* 42* 66*  CREATININE 2.13* 3.27* 3.91* 4.82*  CALCIUM 7.3* 6.9* 7.0* 6.6*  MG 1.6* 1.9  --   --   PHOS 3.9 4.4 4.9* 5.5*   Liver Function Tests: Recent Labs  Lab 09/18/22 0250 09/19/22 0302 09/20/22 0103 09/23/22 0801  AST 12*  --   --   --   ALT 30  --   --   --   ALKPHOS 48  --   --   --   BILITOT 0.7  --   --   --   PROT 4.8*  --   --   --   ALBUMIN 2.1* 2.3* 2.2* 2.3*   No results for input(s): "LIPASE", "AMYLASE" in the last 168 hours. No results for input(s): "AMMONIA" in the last 168 hours. CBC: Recent Labs  Lab 09/18/22 0250 09/19/22 0302 09/20/22 0103  09/23/22 0801  WBC 8.1 8.0 7.2 9.9  NEUTROABS 6.8  --   --   --   HGB 9.4* 10.0* 10.5* 10.0*  HCT 29.7* 31.0* 33.3* 31.7*  MCV 91.7 93.9 94.6 92.7  PLT 216 228 294 296   Cardiac Enzymes: No results for input(s): "CKTOTAL", "CKMB", "CKMBINDEX", "TROPONINI" in the last 168 hours. BNP: BNP (last 3 results) Recent Labs    09/14/22 0512  BNP 1,469.0*    ProBNP (last 3 results) No results for input(s): "PROBNP" in the last 8760 hours.  CBG: Recent Labs  Lab 09/23/22 1248 09/23/22 1631 09/23/22 2111 09/24/22 0617 09/24/22 1124  GLUCAP 101* 179* 169* 124* 177*       Signed:  Ramiro Harvest MD.  [facility] 09/24/2022, 3:13 PM

## 2022-09-24 NOTE — Consult Note (Signed)
OPHTHALMOLOGY CONSULT NOTE  Date: 09/24/22 Time: 7:51 AM  Patient Name: Kim Burns  DOB: [birth year] MRN: [id number]  Reason for Consult: Eye Evaluation  HPI:  This is an 87 y.o. female currently hospitalized and awaiting rehab placement. 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 The patient has a history of trabeculitis for which her IOP was found to be 45 during her last episode in 2018.  The patient states she has no eye complaints but that her glasses are not working great.    Prior to Admission medications   Medication Sig Start Date End Date Taking? Authorizing Provider  acetaminophen (TYLENOL) 325 MG tablet Take 2 tablets (650 mg total) by mouth every 6 (six) hours as needed for mild pain, fever or headache. Patient taking differently: Take 500 mg by mouth every 6 (six) hours as needed for mild pain, fever or headache. 01/30/22  Yes Elgergawy, Leana Roe, MD  amiodarone (PACERONE) 200 MG tablet Take 0.5 tablets (100 mg total) by mouth daily. Please keep appointment for further refills 11/23/21  Yes [place], Peter M, MD  B Complex-C-Zn-Folic Acid (DIALYVITE 800 WITH ZINC) 0.8 MG TABS Take 1 tablet by mouth daily.   Yes [provider]  Brinzolamide-Brimonidine 1-0.2 % SUSP Apply 1 drop to eye 2 (two) times daily.   Yes [provider]  carvedilol (COREG) 3.125 MG tablet TAKE 1 TABLET BY MOUTH TWICE DAILY WITH A MEAL .  TAKE  ON  THE  DAYS  YOU  DO  NOT  HAVE  DIALYSIS. Patient taking differently: Take 3.125 mg by mouth See admin instructions. TAKE 1 TABLET BY MOUTH TWICE DAILY WITH A MEAL . TAKE ON THE DAYS YOU DO NOT HAVE DIALYSIS. 08/12/22  Yes [place], Peter M, MD  Cholecalciferol (VITAMIN D-3 PO) Take 1 capsule by mouth every Monday, Wednesday, and Friday with hemodialysis.   Yes [provider]  colestipol (COLESTID) 1 g tablet Take 1 g by mouth See admin instructions. 1 tablet (1g) prior to dialysis M-W-F + 1 tablet daily PRN  diarrhea   Yes [provider]  diclofenac sodium (VOLTAREN) 1 % GEL Apply 2 g topically 4 (four) times daily as needed (for hand pain).   Yes [provider]  folic acid (FOLVITE) 1 MG tablet Take 1 tablet (1 mg total) by mouth daily. 01/31/22  Yes Elgergawy, Leana Roe, MD  furosemide (LASIX) 20 MG tablet Take 40 mg by mouth 3 (three) times daily. 09/04/22  Yes [provider]  insulin aspart (NOVOLOG) 100 UNIT/ML injection Inject 0-6 Units into the skin 3 (three) times daily with meals. Patient taking differently: Inject 0-6 Units into the skin See admin instructions. Only if sugar is >200 for at least an hour 01/30/22  Yes Elgergawy, Leana Roe, MD  lidocaine-prilocaine (EMLA) cream Apply 1 Application topically every Monday, Wednesday, and Friday with hemodialysis.   Yes [provider]  lipase/protease/amylase (CREON) 36000 UNITS CPEP capsule Take 36,000 Units by mouth 2 (two) times daily with a meal.   Yes [provider]  midodrine (PROAMATINE) 10 MG tablet Take 10 mg on dialysis days Mon-Wed-Fri 06/13/22  Yes [place], Peter M, MD  neomycin-polymyxin b-dexamethasone (MAXITROL) 3.5-10000-0.1 SUSP Place 1 drop into the left eye 3 (three) times daily. 09/12/22  Yes [provider]  olopatadine (PATADAY) 0.1 % ophthalmic solution Place 1 drop into both eyes 2 (two) times daily.   Yes [provider]  prednisoLONE acetate (  PRED FORTE) 1 % ophthalmic suspension Place 1 drop into the left eye 4 (four) times daily. 09/13/22  Yes [provider]  RESTASIS 0.05 % ophthalmic emulsion Place 1 drop into both eyes as needed (yes irratation). 11/06/21  Yes [provider]  rOPINIRole (REQUIP) 0.25 MG tablet Take 0.5 mg by mouth at bedtime. 12/11/21  Yes [provider]  SENSIPAR 30 MG tablet Take 30 mg by mouth with breakfast, with lunch, and with evening meal. 12/18/12  Yes [provider]  sevelamer carbonate (RENVELA)  800 MG tablet Take 1,600 mg by mouth 3 (three) times daily with meals.   Yes [provider]  valACYclovir (VALTREX) 1000 MG tablet Take 1,000 mg by mouth 3 (three) times daily. 09/12/22  Yes [provider]  ciprofloxacin (CIPRO) 500 MG tablet Take 500 mg by mouth 2 (two) times daily. 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 could not tolerate   Stroke (HCC) 04/16/2016   TIA 1995   Vitamin D deficiency     family history includes Alzheimer's disease in her sister and sister; Cancer in her brother and father; Heart disease in her mother; Stroke in her mother.  Social History   Occupational History   Not on file  Tobacco Use   Smoking status: Never   Smokeless tobacco: Never  Vaping Use   Vaping status: Never Used  Substance and Sexual Activity   Alcohol use: No   Drug use: No   Sexual activity: Not on file    Allergies  Allergen Reactions   Lopid [Gemfibrozil] Other (See Comments)    Myalgia    Lotemax [Loteprednol Etabonate] Rash    Skin burning  Blurry vision   Statins Other (See Comments)    Rosuvastatin Muscle weakness   Norco [Hydrocodone-Acetaminophen] Other (See Comments)    Headaches  Tolerates acetaminophen    Other Diarrhea    Real Butter - diarrhea   Vibra-Tab [Doxycycline] Other (See Comments)    Unknown reaction    Amlodipine Other (See Comments)    Norvasc   Codeine Other (See Comments)    headache   Hydrocodone Other (See Comments)   Hydrocodone-Acetaminophen Other (See Comments)    Headache. Tolerates acetaminophen.   Lisinopril Other (See Comments)   Verapamil Other (See Comments)    ROS: Positive as above, otherwise negative.  EXAM:  Mental Status: A&O x 3   Base Exam: Right Eye Left Eye  Visual Acuity (At near) 20/50+2 30+2  IOP (Tonopen) 8 9  Pupillary Exam No RAPD  No RAPD  Motility Full  Full   Confrontation VF Full  Full    Anterior Segment Exam    Lids/Lashes WNL WNL  Conjuctiva White and Quiet White and Quiet  Cornea Clear Clear  Anterior Chamber Deep  Deep  Iris Round, Reactive Round, Reactive  Lens IOL IOL  Vitreous WNL WNL    Radiographic Studies Reviewed: None  Assessment and Recommendation: Herpetic keratitis:   DDX includes either VZV or HSV but no skin lesions noted. IOP is under excellent control on current drops. Prior significant epithelial erosions have resolved.   Plan:   Continue Valtrex at dose per renal  Reduce Prednisolone to BID (Updated in orders)  Continue Brinzolomide and Brimonidine BID  Continue ointment   Plan for follow up in the office in 1-2 weeks continue all medications and discharge and please make office aware of discharge planning so appointment may be arranged.  Please re-consult sooner if any concerns.     Please call with any questions.  Sinda Du MD [facility] Ophthalmology [phone]

## 2022-09-24 NOTE — Progress Notes (Signed)
Occupational Therapy Treatment Patient Details Name: Kim Burns MRN: [id number] DOB: 04-14-1935 Today's Date: 09/24/2022   History of present illness Pt is 87 year old presented to [facility] on  09/13/22 for afib with RVR. Daughter concerned about confusion and difficulty with speech on 7/27. MRI negative. Pt on higher dose of Valtrex than needed for a pt with renal failure and this resulted in confusion, lethargy, jerking etc. PMH - afib, ESRD on HD, chf, DM, HTN, Whipple, renal cell mass   OT comments  Pt progressing towards goals this session, able to complete in room ambulation and stand for 2 grooming tasks at sink with set up - min guard A, min guard A for LB ADL. RUE remains edematous from prior IV, however able to use functionally during ADLs, provided pt with squeeze ball for BUE strengthening and pt able to demo use. Pt presenting with impairments listed below, will follow acutely. Continue to recommend HHOT at d/c.    Recommendations for follow up therapy are one component of a multi-disciplinary discharge planning process, led by the attending physician.  Recommendations may be updated based on patient status, additional functional criteria and insurance authorization.    Assistance Recommended at Discharge Intermittent Supervision/Assistance  Patient can return home with the following  Assistance with cooking/housework;Direct supervision/assist for medications management;Direct supervision/assist for financial management;Assist for transportation;Help with stairs or ramp for entrance;A lot of help with bathing/dressing/bathroom;A lot of help with walking and/or transfers   Equipment Recommendations  BSC/3in1    Recommendations for Other Services PT consult    Precautions / Restrictions Precautions Precautions: Fall Restrictions Weight Bearing Restrictions: No       Mobility Bed Mobility               General bed mobility comments: OOB in chair upon arrival and  departure    Transfers Overall transfer level: Needs assistance Equipment used: Rolling walker (2 wheels) Transfers: Sit to/from Stand Sit to Stand: Min guard                 Balance Overall balance assessment: Needs assistance Sitting-balance support: No upper extremity supported, Feet supported Sitting balance-Leahy Scale: Fair     Standing balance support: Single extremity supported, Bilateral upper extremity supported Standing balance-Leahy Scale: Poor Standing balance comment: reliant on external support                           ADL either performed or assessed with clinical judgement   ADL Overall ADL's : Needs assistance/impaired     Grooming: Set up;Standing;Wash/dry face;Oral care               Lower Body Dressing: Set up;Sitting/lateral leans Lower Body Dressing Details (indicate cue type and reason): figure 4 Toilet Transfer: Min guard;Ambulation;Regular Toilet;Rolling walker (2 wheels)           Functional mobility during ADLs: Min guard;Rolling walker (2 wheels)      Extremity/Trunk Assessment Upper Extremity Assessment Upper Extremity Assessment: Generalized weakness (RUE edema reduced, but still present)   Lower Extremity Assessment Lower Extremity Assessment: Defer to PT evaluation        Vision   Vision Assessment?: No apparent visual deficits   Perception Perception Perception: Not tested   Praxis Praxis Praxis: Not tested    Cognition Arousal/Alertness: Awake/alert Behavior During Therapy: [facility] for tasks assessed/performed  Exercises      Shoulder Instructions       General Comments VSS on RA    Pertinent Vitals/ Pain       Pain Assessment Pain Assessment: Faces Pain Score: 2  Faces Pain Scale: Hurts a little bit Pain Location: RUE Pain Descriptors / Indicators: Grimacing, Guarding, Sore Pain Intervention(s): Limited activity within  patient's tolerance, Monitored during session, Repositioned  Home Living                                          Prior Functioning/Environment              Frequency  Min 1X/week        Progress Toward Goals  OT Goals(current goals can now be found in the care plan section)  Progress towards OT goals: Progressing toward goals  Acute Rehab OT Goals Patient Stated Goal: none stated OT Goal Formulation: With patient Time For Goal Achievement: 10/01/22 Potential to Achieve Goals: Good ADL Goals Pt Will Perform Upper Body Dressing: with supervision;sitting Pt Will Perform Lower Body Dressing: with supervision;sitting/lateral leans;sit to/from stand Pt Will Transfer to Toilet: with supervision;ambulating;regular height toilet Additional ADL Goal #1: pt will perform bed mobility with supervision in prep for ADLs  Plan Discharge plan remains appropriate    Co-evaluation                 AM-PAC OT "6 Clicks" Daily Activity     Outcome Measure   Help from another person eating meals?: A Little Help from another person taking care of personal grooming?: A Little Help from another person toileting, which includes using toliet, bedpan, or urinal?: A Little Help from another person bathing (including washing, rinsing, drying)?: A Little Help from another person to put on and taking off regular upper body clothing?: A Little Help from another person to put on and taking off regular lower body clothing?: A Little 6 Click Score: 18    End of Session Equipment Utilized During Treatment: Rolling walker (2 wheels)  OT Visit Diagnosis: Unsteadiness on feet (R26.81);Other abnormalities of gait and mobility (R26.89);Muscle weakness (generalized) (M62.81)   Activity Tolerance Patient tolerated treatment well   Patient Left in chair;with call bell/phone within reach;with chair alarm set   Nurse Communication Mobility status        Time: 6213-0865 OT  Time Calculation (min): 21 min  Charges: OT General Charges $OT Visit: 1 Visit OT Treatments $Self Care/Home Management : 8-22 mins  Carver Fila, OTD, OTR/L SecureChat Preferred Acute Rehab [phone]    K Koonce 09/24/2022, 8:50 AM

## 2022-09-24 NOTE — TOC Transition Note (Signed)
Transition of Care [facility]) - CM/SW Discharge Note   Patient Details  Name: ALEAH GRAJEWSKI MRN: [id number] Date of Birth: [birth year]  Transition of Care [facility]) CM/SW Contact:  Leone Haven, RN Phone Number: 09/24/2022, 3:00 PM   Clinical Narrative:    Patient is set up with [facility] services, she has the rollator delivered to the room.  She is for possible dc today.       Barriers to Discharge: English as a second language teacher, SNF Pending bed offer (Dialysis transport)   Patient Goals and CMS Choice      Discharge Placement                         Discharge Plan and Services Additional resources added to the After Visit Summary for                            HH Arranged: OT, PT HH Agency: [facility] Date [facility] Agency Contacted: 09/18/22 Time HH Agency Contacted: [phone] Representative spoke with at [facility] Agency: Tresa Endo  Social Determinants of Health (SDOH) Interventions SDOH Screenings   Food Insecurity: No Food Insecurity (11/01/2021)  Housing: Low Risk  (11/01/2021)  Transportation Needs: No Transportation Needs (11/01/2021)  Utilities: Not At Risk (11/01/2021)  Tobacco Use: Low Risk  (09/14/2022)     Readmission Risk Interventions    08/27/2021   10:16 AM  Readmission Risk Prevention Plan  Transportation Screening Complete  PCP or Specialist Appt within 3-5 Days Complete  HRI or Home Care Consult Complete  Social Work Consult for Recovery Care Planning/Counseling Complete  Palliative Care Screening Not Applicable  Medication Review Oceanographer) Complete

## 2022-09-24 NOTE — Plan of Care (Signed)
[facility] Patient Discharge Orders- [facility] CLINIC: [facility]  Patient's name: KORALINE BARES Admit/DC Dates: 09/13/2022 - 09/24/2022  Discharge Diagnoses: Afib with RVR, no PO BB and Amio, f/u with Cards scheduled Acute metabolic encephalopathy/myoclonus: 2nd inappropriate Valtrex dosing-dose renally adjusted while here  Aranesp: Given: No     Last Hgb: 10 PRBC's Given: No  ESA dose for discharge: Resume mircera 75 mcg IV q 2 weeks  IV Iron dose at discharge: Resume weekly Venofer  Heparin change: No  EDW Change: No   Bath Change: Yes, change to 2.5Ca bath (last Corr Ca here was 7.9)  Access intervention/Change: No   Hectorol change: No  Discharge Labs: Calcium 6.6 Phosphorus 5.5 Albumin 2.3 K+ 3.9  IV Antibiotics: No   On Coumadin?: No    OTHER/APPTS/LAB ORDERS:    D/C Meds to be reconciled by nurse after every discharge.  Completed By: Salome Holmes, NP   Reviewed by: MD:______ RN_______

## 2022-09-24 NOTE — Progress Notes (Signed)
PT Cancellation Note  Patient Details Name: Kim Burns MRN: [id number] DOB: [birth year]   Cancelled Treatment:    Reason Eval/Treat Not Completed: Other (comment) (pt eating, will plan to return)    B  09/24/2022, 11:42 AM Merryl Hacker, PT Acute Rehabilitation Services Office: [phone]

## 2022-09-24 NOTE — Progress Notes (Signed)
[facility] Progress Note   Subjective:    Seen and examined patient at bedside. Tolerated yesterday's HD with net UF 2.5L. Discussed with Dr. Janee Morn. Plan for patient to go home today.  Objective Vitals:   09/24/22 0330 09/24/22 0729 09/24/22 0907 09/24/22 1126  BP:  (!) 148/92 (!) 144/55 (!) 147/69  Pulse:   97   Resp:  (!) 21 19 18   Temp:  (!) 97.1 F (36.2 C)  (!) 97.5 F (36.4 C)  TempSrc:  Axillary  Oral  SpO2:      Weight: 55 kg      Physical Exam General: Awake, alert, NAD Heart: Irregular, rate controlled Lungs: Clear ant Abdomen: soft and non-tender Extremities: no edema, R forearm and AC fossa with extensive ecchymosis, no warmth or fluctuance - looks a bit better, area marked Dialysis Access:  LUE AVF +t/b  Filed Weights   09/22/22 0338 09/23/22 0600 09/24/22 0330  Weight: 56 kg 57.2 kg 55 kg    Intake/Output Summary (Last 24 hours) at 09/24/2022 1510 Last data filed at 09/24/2022 1236 Gross per 24 hour  Intake 240 ml  Output 25 ml  Net 215 ml    Additional Objective Labs: Basic Metabolic Panel: Recent Labs  Lab 09/19/22 0302 09/20/22 0103 09/23/22 0801  NA 128* 130* 130*  K 4.1 3.7 3.9  CL 92* 94* 96*  CO2 22 23 21*  GLUCOSE 154* 118* 150*  BUN 30* 42* 66*  CREATININE 3.27* 3.91* 4.82*  CALCIUM 6.9* 7.0* 6.6*  PHOS 4.4 4.9* 5.5*   Liver Function Tests: Recent Labs  Lab 09/18/22 0250 09/19/22 0302 09/20/22 0103 09/23/22 0801  AST 12*  --   --   --   ALT 30  --   --   --   ALKPHOS 48  --   --   --   BILITOT 0.7  --   --   --   PROT 4.8*  --   --   --   ALBUMIN 2.1* 2.3* 2.2* 2.3*   No results for input(s): "LIPASE", "AMYLASE" in the last 168 hours. CBC: Recent Labs  Lab 09/18/22 0250 09/19/22 0302 09/20/22 0103 09/23/22 0801  WBC 8.1 8.0 7.2 9.9  NEUTROABS 6.8  --   --   --   HGB 9.4* 10.0* 10.5* 10.0*  HCT 29.7* 31.0* 33.3* 31.7*  MCV 91.7 93.9 94.6 92.7  PLT 216 228 294 296   Blood Culture     Component Value Date/Time   SDES BLOOD RIGHT ANTECUBITAL 01/22/2022 0038   SPECREQUEST  01/22/2022 0038    BOTTLES DRAWN AEROBIC AND ANAEROBIC Blood Culture results may not be optimal due to an inadequate volume of blood received in culture bottles   CULT  01/22/2022 0038    NO GROWTH 5 DAYS Performed at [facility], [address] [place], [place] [address]    REPTSTATUS 01/27/2022 FINAL 01/22/2022 0038    Cardiac Enzymes: No results for input(s): "CKTOTAL", "CKMB", "CKMBINDEX", "TROPONINI" in the last 168 hours. CBG: Recent Labs  Lab 09/23/22 1248 09/23/22 1631 09/23/22 2111 09/24/22 0617 09/24/22 1124  GLUCAP 101* 179* 169* 124* 177*   Iron Studies: No results for input(s): "IRON", "TIBC", "TRANSFERRIN", "FERRITIN" in the last 72 hours. Lab Results  Component Value Date   INR 1.0 03/14/2022   INR 1.4 (H) 01/22/2022   INR 1.2 11/01/2021   Studies/Results: No results found.  Medications:   amiodarone  100 mg Oral Daily   brinzolamide  1 drop Left Eye BID   And   brimonidine  1 drop Left Eye BID   Chlorhexidine Gluconate Cloth  6 each Topical Q0600   cinacalcet  30 mg Oral Q M,W,F-HD   erythromycin   Left Eye BID   insulin aspart  0-6 Units Subcutaneous TID WC   lipase/protease/amylase  36,000 Units Oral TID WC   metoprolol tartrate  50 mg Oral BID   mouth rinse  15 mL Mouth Rinse 4 times per day   prednisoLONE acetate  1 drop Left Eye BID   rOPINIRole  0.25 mg Oral BID   sevelamer carbonate  1,600 mg Oral TID WC   valACYclovir  500 mg Oral QHS    Dialysis Orders: [facility] MWF  3.5h  350/ 1.5  55kg  2/2 bath  AVF   Heparin none - last OP HD 7/26, post wt 57.5kg   - coming up 2-3kg over dry wt for last 3 wks - hectorol 5 mcg IV three times per week - mircera 75 mcg IV q 2 wks, last 7/17, due 7/31  Assessment/Plan: Afib w/ RVR - was on dilt gtt and po amiodarone, BB titrated and off gtt now.  Remains in a fib, rate controlled.  No anticoag due to  bleeding risk AMS - likely due to excessive dosing of the valacyclovir at 1 gm tid; the adjustment for esrd patient would be 500 mg daily. Held and rec'd dialysis x 2 then resumed at ESRD dose pm 7/31 as indication was Hsv keratitis vs zoster ophthalmicus  ESRD - on HD MWF, next HD 09/25/22 in outpatient HTN/ volume - euvolemic on exam, BP's normal.  Last tx standing post weight was 54.6kg - will need EDW adjusted at d/c. Anemia esrd - Hb 10-11 here, esa on 7/31. Follow.  MBD ckd - CCa in range, phos is high. Cont binder, renal diet and HD for high phos. Cont IV vdra, sensipar 30 tid.  DM2 - on insulin 8. RLS - increased ropinirole to BID from at bedtime on 8/1. 9. Dispo- [place] for discharge from a renal standpoint  Salome Holmes, NP [facility] 09/24/2022,3:10 PM  LOS: 9 days

## 2022-09-24 NOTE — Progress Notes (Signed)
Advised by attending that pt will d/c today to home. Contacted [facility] to advise clinic of pt's d/c today and that pt should resume care tomorrow.   Olivia Canter Renal Navigator [phone]

## 2022-09-25 DIAGNOSIS — Z992 Dependence on renal dialysis: Secondary | ICD-10-CM | POA: Diagnosis not present

## 2022-09-25 DIAGNOSIS — N186 End stage renal disease: Secondary | ICD-10-CM | POA: Diagnosis not present

## 2022-09-25 DIAGNOSIS — N2581 Secondary hyperparathyroidism of renal origin: Secondary | ICD-10-CM | POA: Diagnosis not present

## 2022-09-26 NOTE — TOC Transition Note (Signed)
Transition of Care - Initial Contact from Inpatient Facility  Date of discharge: 09/24/22 Date of contact: 09/26/22  Method: Attempted Phone Call Spoke to: No Answer  Patient contacted to discuss transition of care from recent inpatient hospitalization but she didn't pick up the phone. A voicemail was left to call the [facility] HD unit [phone] for any questions or concerns.  Patient received HD yesterday. Next HD 09/27/22 at [facility]  Salome Holmes, NP

## 2022-09-27 DIAGNOSIS — N2581 Secondary hyperparathyroidism of renal origin: Secondary | ICD-10-CM | POA: Diagnosis not present

## 2022-09-27 DIAGNOSIS — Z992 Dependence on renal dialysis: Secondary | ICD-10-CM | POA: Diagnosis not present

## 2022-09-27 DIAGNOSIS — N186 End stage renal disease: Secondary | ICD-10-CM | POA: Diagnosis not present

## 2022-09-30 DIAGNOSIS — Z992 Dependence on renal dialysis: Secondary | ICD-10-CM | POA: Diagnosis not present

## 2022-09-30 DIAGNOSIS — N186 End stage renal disease: Secondary | ICD-10-CM | POA: Diagnosis not present

## 2022-09-30 DIAGNOSIS — N2581 Secondary hyperparathyroidism of renal origin: Secondary | ICD-10-CM | POA: Diagnosis not present

## 2022-10-01 ENCOUNTER — Encounter: Payer: Self-pay | Admitting: Nurse Practitioner

## 2022-10-01 ENCOUNTER — Ambulatory Visit: Payer: Medicare PPO | Attending: Nurse Practitioner | Admitting: Nurse Practitioner

## 2022-10-01 VITALS — BP 132/80 | HR 94 | Ht 63.0 in | Wt 121.0 lb

## 2022-10-01 DIAGNOSIS — N186 End stage renal disease: Secondary | ICD-10-CM | POA: Diagnosis not present

## 2022-10-01 DIAGNOSIS — I951 Orthostatic hypotension: Secondary | ICD-10-CM | POA: Diagnosis not present

## 2022-10-01 DIAGNOSIS — I2510 Atherosclerotic heart disease of native coronary artery without angina pectoris: Secondary | ICD-10-CM

## 2022-10-01 DIAGNOSIS — E1122 Type 2 diabetes mellitus with diabetic chronic kidney disease: Secondary | ICD-10-CM

## 2022-10-01 DIAGNOSIS — I10 Essential (primary) hypertension: Secondary | ICD-10-CM | POA: Diagnosis not present

## 2022-10-01 DIAGNOSIS — I480 Paroxysmal atrial fibrillation: Secondary | ICD-10-CM | POA: Diagnosis not present

## 2022-10-01 DIAGNOSIS — N184 Chronic kidney disease, stage 4 (severe): Secondary | ICD-10-CM

## 2022-10-01 MED ORDER — AMIODARONE HCL 200 MG PO TABS: 200.0000 mg | ORAL_TABLET | Freq: Every day | ORAL | 3 refills | Status: DC

## 2022-10-01 NOTE — Patient Instructions (Signed)
Medication Instructions:  Increase Amiodarone 200 mg daily   *If you need a refill on your cardiac medications before your next appointment, please call your pharmacy*   Lab Work: NONE ordered at this time of appointment     Testing/Procedures: NONE ordered at this time of appointment     Follow-Up: At [facility], you and your health needs are our priority.  As part of our continuing mission to provide you with exceptional heart care, we have created designated Provider Care Teams.  These Care Teams include your primary Cardiologist (physician) and Advanced Practice Providers (APPs -  Physician Assistants and Nurse Practitioners) who all work together to provide you with the care you need, when you need it.  We recommend signing up for the patient portal called "MyChart".  Sign up information is provided on this After Visit Summary.  MyChart is used to connect with patients for Virtual Visits (Telemedicine).  Patients are able to view lab/test results, encounter notes, upcoming appointments, etc.  Non-urgent messages can be sent to your provider as well.   To learn more about what you can do with MyChart, go to [email or web address].    Your next appointment:   4-6 weeks Bernadene Person NP) Keep apt with Dr. [place]  Provider:   Peter [place], MD  or Bernadene Person, NP        Other Instructions  Monitor Blood pressure. [employer] discussed today.

## 2022-10-01 NOTE — Progress Notes (Addendum)
Office Visit    Patient Name: Kim Burns Date of Encounter: 10/01/2022  Primary Care Provider:  Lewis Moccasin, MD Primary Cardiologist:  Peter Swaziland, MD  Chief Complaint    87 year old female with a history of persistent atrial fibrillation, nonobstructive CAD, orthostatic hypotension, hypertension, a ESRD on HD MWF, type 2 diabetes, CVA, whipple procedure in 2014 with pancreatectomy and partial bowel resection, renal carcinoma s/p ablation in 08/2021 who presents for hospital follow-up related to atrial fibrillation.  Past Medical History    Past Medical History:  Diagnosis Date   Anemia of chronic disease    takes iron   Anxiety    Blood transfusion without reported diagnosis    Bradycardia    Diabetes mellitus without complication (HCC)    became diabetic after Whipple procedure   Diarrhea    Dysrhythmia 04/16/2016   bradycardia due to medication    ESRD on hemodialysis [facility])    M-W-F   GERD (gastroesophageal reflux disease)    Gout    Headache    History of kidney stones 06/2013   Hyperparathyroidism (HCC)    Hypertension    PONV (postoperative nausea and vomiting)    Sleep apnea    no cpap machine. could not tolerate   Stroke (HCC) 04/16/2016   TIA 1995   Vitamin D deficiency    Past Surgical History:  Procedure Laterality Date   A/V FISTULAGRAM Left 08/30/2021   Procedure: A/V Fistulagram;  Surgeon: Cephus Shelling, MD;  Location: [facility] INVASIVE CV LAB;  Service: Cardiovascular;  Laterality: Left;   ABDOMINAL HYSTERECTOMY  1985   complete   BACK SURGERY  1980   lower   BASCILIC VEIN TRANSPOSITION Left 04/24/2017   Procedure: LEFT ARM FIRST STAGE BASILIC VEIN TRANSPOSITION;  Surgeon: Nada Libman, MD;  Location: [facility] OR;  Service: Vascular;  Laterality: Left;   BASCILIC VEIN TRANSPOSITION Left 07/10/2017   Procedure: SECOND STAGE BASILIC VEIN TRANSPOSITION LEFT ARM;  Surgeon: Nada Libman, MD;  Location: [facility] OR;  Service: Vascular;   Laterality: Left;   BREAST LUMPECTOMY Left x 2   many years apart, benign   CHOLECYSTECTOMY  1985   CYSTOSCOPY W/ URETERAL STENT PLACEMENT Left 10/31/2021   Procedure: CYSTOSCOPY WITH RETROGRADE PYELOGRAM/URETERAL STENT PLACEMENT;  Surgeon: Crist Fat, MD;  Location: [facility] OR;  Service: Urology;  Laterality: Left;   CYSTOSCOPY WITH URETEROSCOPY AND STENT PLACEMENT Left 06/18/2013   Procedure: CYSTOSCOPY WITH Lef URETEROSCOPY AND Left STENT PLACEMENT;  Surgeon: Crecencio Mc, MD;  Location: [facility] ORS;  Service: Urology;  Laterality: Left;   ESOPHAGOGASTRODUODENOSCOPY (EGD) WITH PROPOFOL N/A 11/13/2016   Procedure: ESOPHAGOGASTRODUODENOSCOPY (EGD) WITH PROPOFOL;  Surgeon: Kathi Der, MD;  Location: [facility] ENDOSCOPY;  Service: Gastroenterology;  Laterality: N/A;   EUS N/A 02/07/2016   Procedure: ESOPHAGEAL ENDOSCOPIC ULTRASOUND (EUS) RADIAL;  Surgeon: Willis Modena, MD;  Location: [facility] ENDOSCOPY;  Service: Endoscopy;  Laterality: N/A;   EYE SURGERY Bilateral 2014   ioc for catracts    FOOT SURGERY Left 1990   something with toes unsure what    HERNIA REPAIR  2018   IR CATHETER TUBE CHANGE  03/14/2022   IR EMBO TUMOR ORGAN ISCHEMIA INFARCT INC GUIDE ROADMAPPING  08/22/2021   IR NEPHROSTOMY EXCHANGE LEFT  06/27/2022   IR NEPHROSTOMY EXCHANGE LEFT  08/26/2022   IR NEPHROSTOMY PLACEMENT LEFT  03/14/2022   IR RADIOLOGIST EVAL & MGMT  05/18/2021   IR RADIOLOGIST EVAL & MGMT  10/01/2021   IR RADIOLOGIST EVAL &  MGMT  11/12/2021   IR RADIOLOGIST EVAL & MGMT  11/22/2021   IR RADIOLOGIST EVAL & MGMT  11/27/2021   IR RADIOLOGIST EVAL & MGMT  12/20/2021   IR RENAL SUPRASEL UNI S&I MOD SED  08/22/2021   IR US GUIDE VASC ACCESS RIGHT  08/22/2021   LEFT HEART CATH AND CORONARY ANGIOGRAPHY N/A 08/27/2021   Procedure: LEFT HEART CATH AND CORONARY ANGIOGRAPHY;  Surgeon: Kathleene Hazel, MD;  Location: [facility] INVASIVE CV LAB;  Service: Cardiovascular;  Laterality: N/A;   PARATHYROID EXPLORATION     PERIPHERAL VASCULAR  BALLOON ANGIOPLASTY Left 08/30/2021   Procedure: PERIPHERAL VASCULAR BALLOON ANGIOPLASTY;  Surgeon: Cephus Shelling, MD;  Location: [facility] INVASIVE CV LAB;  Service: Cardiovascular;  Laterality: Left;  arm fistula   RADIOLOGY WITH ANESTHESIA Left 08/22/2021   Procedure: MICROWAVE ABLATION;  Surgeon: Simonne Come, MD;  Location: [facility] ORS;  Service: Anesthesiology;  Laterality: Left;   WHIPPLE PROCEDURE N/A 04/23/2016   Procedure: WHIPPLE PROCEDURE;  Surgeon: Almond Lint, MD;  Location: [facility] OR;  Service: General;  Laterality: N/A;    Allergies  Allergies  Allergen Reactions   Lopid [Gemfibrozil] Other (See Comments)    Myalgia    Lotemax [Loteprednol Etabonate] Rash    Skin burning  Blurry vision   Statins Other (See Comments)    Rosuvastatin Muscle weakness   Norco [Hydrocodone-Acetaminophen] Other (See Comments)    Headaches  Tolerates acetaminophen    Other Diarrhea    Real Butter - diarrhea   Vibra-Tab [Doxycycline] Other (See Comments)    Unknown reaction   Amlodipine Other (See Comments)    Norvasc   Codeine Other (See Comments)    headache   Hydrocodone Other (See Comments)   Hydrocodone-Acetaminophen Other (See Comments)    Headache. Tolerates acetaminophen.   Lisinopril Other (See Comments)   Verapamil Other (See Comments)     Labs/Other Studies Reviewed    The following studies were reviewed today:  Cardiac Studies & Procedures   CARDIAC CATHETERIZATION  CARDIAC CATHETERIZATION 08/27/2021  Narrative   Prox RCA lesion is 20% stenosed.   Mid LM to Dist LM lesion is 20% stenosed.   Ost LAD lesion is 30% stenosed.   Mid Cx lesion is 20% stenosed.   Prox LAD lesion is 50% stenosed.   The left ventricular systolic function is normal.   LV end diastolic pressure is normal.   The left ventricular ejection fraction is 55-65% by visual estimate.   There is no mitral valve regurgitation.  Mild non-obstructive CAD Normal LV systolic function Elevated troponin and  chest pain is likely due to demand ischemic in the setting of rapid atrial fibrillation.  Recommendation: Medical management of CAD.  Findings Coronary Findings Diagnostic  Dominance: Right  Left Main Mid LM to Dist LM lesion is 20% stenosed.  Left Anterior Descending Vessel is large. Ost LAD lesion is 30% stenosed. Prox LAD lesion is 50% stenosed. The lesion is calcified.  Left Circumflex Mid Cx lesion is 20% stenosed.  Right Coronary Artery Vessel is large. Prox RCA lesion is 20% stenosed.  Intervention  No interventions have been documented.     ECHOCARDIOGRAM  ECHOCARDIOGRAM COMPLETE 09/15/2022  Narrative ECHOCARDIOGRAM REPORT    Patient Name:   Nneoma P Sinagra Date of Exam: 09/15/2022 Medical Rec #:  [id number]        Height:       62.0 in Accession #:    [id number]       Weight:  116.4 lb Date of Birth:  09/24/1935        BSA:          1.519 m Patient Age:    87 years         BP:           158/89 mmHg Patient Gender: F                HR:           88 bpm. Exam Location:  Inpatient  Procedure: 2D Echo, Cardiac Doppler and Color Doppler  Indications:    Atrial fibrillation  History:        Patient has prior history of Echocardiogram examinations, most recent 08/27/2021. CHF, end stage renal disease; Risk Factors:Diabetes and Hypertension.  Sonographer:    [name] RDCS Referring Phys: [phone] A CALDWELL POWELL JR   Sonographer Comments: Tremors. IMPRESSIONS   1. Left ventricular ejection fraction, by estimation, is 55 to 60%. The left ventricle has normal function. The left ventricle has no regional wall motion abnormalities. Left ventricular diastolic parameters are indeterminate. 2. Right ventricular systolic function is mildly reduced. The right ventricular size is normal. There is severely elevated pulmonary artery systolic pressure. 3. Left atrial size was severely dilated. 4. Right atrial size was mild to moderately dilated. 5. The  mitral valve is degenerative. Mild mitral valve regurgitation. Moderate mitral annular calcification. 6. Tricuspid valve regurgitation is moderate. 7. The aortic valve is tricuspid. Aortic valve regurgitation is not visualized. 8. The inferior vena cava is normal in size with greater than 50% respiratory variability, suggesting right atrial pressure of 3 mmHg.  FINDINGS Left Ventricle: Left ventricular ejection fraction, by estimation, is 55 to 60%. The left ventricle has normal function. The left ventricle has no regional wall motion abnormalities. The left ventricular internal cavity size was normal in size. There is no left ventricular hypertrophy. Left ventricular diastolic parameters are indeterminate.  Right Ventricle: The right ventricular size is normal. Right ventricular systolic function is mildly reduced. There is severely elevated pulmonary artery systolic pressure. The tricuspid regurgitant velocity is 3.90 m/s, and with an assumed right atrial pressure of 3 mmHg, the estimated right ventricular systolic pressure is 63.8 mmHg.  Left Atrium: Left atrial size was severely dilated.  Right Atrium: Right atrial size was mild to moderately dilated.  Pericardium: There is no evidence of pericardial effusion.  Mitral Valve: The mitral valve is degenerative in appearance. Moderate mitral annular calcification. Mild mitral valve regurgitation.  Tricuspid Valve: The tricuspid valve is normal in structure. Tricuspid valve regurgitation is moderate.  Aortic Valve: The aortic valve is tricuspid. Aortic valve regurgitation is not visualized.  Pulmonic Valve: The pulmonic valve was normal in structure. Pulmonic valve regurgitation is not visualized.  Aorta: The aortic root and ascending aorta are structurally normal, with no evidence of dilitation.  Venous: The inferior vena cava is normal in size with greater than 50% respiratory variability, suggesting right atrial pressure of 3  mmHg.  IAS/Shunts: No atrial level shunt detected by color flow Doppler.   LEFT VENTRICLE PLAX 2D LVIDd:         4.40 cm   Diastology LVIDs:         3.30 cm   LV e' medial:  6.74 cm/s LV PW:         0.90 cm   LV e' lateral: 9.03 cm/s LV IVS:        1.00 cm LVOT diam:  1.80 cm LV SV:         45 LV SV Index:   29 LVOT Area:     2.54 cm   RIGHT VENTRICLE            IVC RV Basal diam:  3.00 cm    IVC diam: 1.90 cm RV S prime:     8.81 cm/s TAPSE (M-mode): 1.1 cm  LEFT ATRIUM             Index        RIGHT ATRIUM           Index LA diam:        3.90 cm 2.57 cm/m   RA Area:     16.70 cm LA Vol (A2C):   95.7 ml 63.01 ml/m  RA Volume:   40.70 ml  26.80 ml/m LA Vol (A4C):   77.1 ml 50.76 ml/m LA Biplane Vol: 86.8 ml 57.15 ml/m AORTIC VALVE LVOT Vmax:   85.10 cm/s LVOT Vmean:  57.500 cm/s LVOT VTI:    0.175 m  AORTA Ao Root diam: 3.00 cm Ao Asc diam:  2.90 cm  TRICUSPID VALVE TR Peak grad:   60.8 mmHg TR Vmax:        390.00 cm/s  SHUNTS Systemic VTI:  0.18 m Systemic Diam: 1.80 cm  Carolan Clines Electronically signed by Carolan Clines Signature Date/Time: 09/15/2022/1:17:18 PM    Final            Recent Labs: 09/14/2022: B Natriuretic Peptide 1,469.0; TSH 2.840 09/18/2022: ALT 30 09/19/2022: Magnesium 1.9 09/23/2022: BUN 66; Creatinine, Ser 4.82; Hemoglobin 10.0; Platelets 296; Potassium 3.9; Sodium 130  Recent Lipid Panel    Component Value Date/Time   CHOL 95 08/26/2021 0447   CHOL 133 08/09/2021 1143   TRIG 110 08/26/2021 0447   HDL 35 (L) 08/26/2021 0447   HDL 43 08/09/2021 1143   CHOLHDL 2.7 08/26/2021 0447   VLDL 22 08/26/2021 0447   LDLCALC 38 08/26/2021 0447   LDLCALC 68 08/09/2021 1143    History of Present Illness    87 year old female with the above past medical history including persistent atrial fibrillation, nonobstructive CAD, orthostatic hypotension, hypertension, a ESRD on HD MWF, type 2 diabetes, CVA, whipple procedure in 2014 with  pancreatectomy and partial bowel resection, and renal carcinoma s/p ablation in 08/2021.   She was diagnosed with atrial fibrillation following ablation procedure for left renal carcinoma in July 2023.  She was placed on rate control medications and Eliquis and, spontaneously converted to sinus rhythm.  She had elevated troponin at the time.  Cardiac catheterization revealed nonobstructive CAD with 20% proximal RCA, 20% left circumflex, 20% left main, 30% ostial LAD, 50% proximal LAD stenoses, EF 55 to 65%.  Echocardiogram the time showed EF 60 to 65%, moderately reduced RV systolic function, G3 DD, RVSP 46.1 mmHg, severe BAE, mild MR, mild to moderate TR.  Had sepsis due to hydronephrosis and obstruction in 10/2021.  She had significant anemia and subscapular renal hematoma in December 2023, Eliquis was discontinued.  She was last seen in the office on 08/20/2022 and was stable from a cardiac standpoint.  She denied any awareness of atrial fibrillation.  She did note orthostatic hypotension (she takes midodrine on dialysis days).  She has had difficulty managing fluid volume with hypotension during dialysis.  She presented to the ED on 09/13/2022 with generalized weakness and fatigue.  She was found to be in atrial fibrillation with RVR, HR 150-170s.  She was started on diltiazem drip.  Cardiology was consulted.  Again the decision was made to defer anticoagulation as the risk of bleeding outweighed the risk of stroke.  Repeat echocardiogram showed EF 55-60%, no RWMA, normal RV size, moderately reduced RV function, severely elevated PA systolic pressure, severe LA enlargement, mild MR and mild TR.  She was also noted to have acute metabolic encephalopathy/myoclonus thought to be secondary to inappropriate Valtrex dosing in the setting of zoster ophthalmicus.  Her mental status improved with renal dosing. She was discharged home in stable condition on 09/24/2022.  She presents today for follow-up accompanied by her  son.  Since her hospitalization She has been stable from a cardiac standpoint though she does note intermittently elevated heart rate in the 120s to 130s. She also reports persistent weakness, dizziness, generalized fatigue, bilateral lower extremity edema, left greater than right, as well as pain to her legs and feet bilaterally.  She denies any chest pain.  She does have some dyspnea with exertion, denies dizziness, presyncope, syncope, PND, orthopnea, weight gain.  She feels generally unwell.   Home Medications    Current Outpatient Medications  Medication Sig Dispense Refill   acetaminophen (TYLENOL) 325 MG tablet Take 2 tablets (650 mg total) by mouth every 6 (six) hours as needed for mild pain, fever or headache. (Patient taking differently: Take 500 mg by mouth every 6 (six) hours as needed for mild pain, fever or headache.)     amiodarone (PACERONE) 200 MG tablet Take 1 tablet (200 mg total) by mouth daily. 90 tablet 3   B Complex-C-Zn-Folic Acid (DIALYVITE 800 WITH ZINC) 0.8 MG TABS Take 1 tablet by mouth daily.     Brinzolamide-Brimonidine 1-0.2 % SUSP Apply 1 drop to eye 2 (two) times daily.     Cholecalciferol (VITAMIN D-3 PO) Take 1 capsule by mouth every Monday, Wednesday, and Friday with hemodialysis.     colestipol (COLESTID) 1 g tablet Take 1 g by mouth See admin instructions. 1 tablet (1g) prior to dialysis M-W-F + 1 tablet daily PRN diarrhea     diclofenac sodium (VOLTAREN) 1 % GEL Apply 2 g topically 4 (four) times daily as needed (for hand pain).     diclofenac Sodium (VOLTAREN) 1 % GEL Apply 2 g topically 3 (three) times daily as needed (pain). 100 g 0   erythromycin ophthalmic ointment Place into the left eye 2 (two) times daily. 3.5 g 0   folic acid (FOLVITE) 1 MG tablet Take 1 tablet (1 mg total) by mouth daily.     insulin aspart (NOVOLOG) 100 UNIT/ML injection Inject 0-6 Units into the skin 3 (three) times daily with meals. (Patient taking differently: Inject 0-6 Units  into the skin See admin instructions. Only if sugar is >200 for at least an hour) 10 mL 11   lidocaine-prilocaine (EMLA) cream Apply 1 Application topically every Monday, Wednesday, and Friday with hemodialysis.     lipase/protease/amylase (CREON) 36000 UNITS CPEP capsule Take 36,000 Units by mouth 2 (two) times daily with a meal.     Menthol-Methyl Salicylate (MUSCLE RUB) 10-15 % CREA Apply 1 Application topically as needed for muscle pain.     metoprolol tartrate (LOPRESSOR) 50 MG tablet Take 1 tablet (50 mg total) by mouth 2 (two) times daily. 120 tablet 1   midodrine (PROAMATINE) 10 MG tablet Take 10 mg on dialysis days Mon-Wed-Fri     olopatadine (PATADAY) 0.1 % ophthalmic solution Place 1 drop into both eyes 2 (two) times  daily.     prednisoLONE acetate (PRED FORTE) 1 % ophthalmic suspension Place 1 drop into the left eye 2 (two) times daily.     RESTASIS 0.05 % ophthalmic emulsion Place 1 drop into both eyes as needed (yes irratation).     rOPINIRole (REQUIP) 0.25 MG tablet Take 1 tablet (0.25 mg total) by mouth 2 (two) times daily. 60 tablet 1   SENSIPAR 30 MG tablet Take 30 mg by mouth with breakfast, with lunch, and with evening meal.     sevelamer carbonate (RENVELA) 800 MG tablet Take 1,600 mg by mouth 3 (three) times daily with meals.     valACYclovir (VALTREX) 500 MG tablet Take 1 tablet (500 mg total) by mouth at bedtime. 14 tablet 0   No current facility-administered medications for this visit.     Review of Systems    She denies chest pain, palpitations, pnd, orthopnea, n, v, dizziness, syncope, weight gain, or early satiety. All other systems reviewed and are otherwise negative except as noted above.   Physical Exam    VS:  BP 132/80 (BP Location: Right Arm, Patient Position: Sitting, Cuff Size: Normal)   Pulse 94   Ht 5\' 3"  (1.6 m)   Wt 121 lb (54.9 kg)   SpO2 99%   BMI 21.43 kg/m   GEN: Well nourished, well developed, in no acute distress. HEENT: normal. Neck:  Supple, no JVD, carotid bruits, or masses. Cardiac: IRIR, no murmurs, rubs, or gallops. No clubbing, cyanosis, non-pitting bilateral lower extremity edema.  Radials/DP/PT 2+ and equal bilaterally.  Respiratory:  Respirations regular and unlabored, clear to auscultation bilaterally. GI: Soft, nontender, nondistended, BS + x 4. MS: no deformity or atrophy. Skin: warm and dry, no rash. Neuro:  Strength and sensation are intact. Psych: Normal affect.  Accessory Clinical Findings    ECG personally reviewed by me today - EKG Interpretation Date/Time:  Tuesday October 01 2022 15:00:56 EDT Ventricular Rate:  99 PR Interval:    QRS Duration:  82 QT Interval:  426 QTC Calculation: 546 R Axis:   -16  Text Interpretation: Atrial fibrillation Inferior infarct , age undetermined Anteroseptal infarct , age undetermined When compared with ECG of 16-Sep-2022 05:38, Vent. rate has decreased BY  69 BPM Anteroseptal infarct is now Present Inferior infarct is now Present Nonspecific T wave abnormality now evident in Anterior leads Confirmed by Bernadene Person ([id number]) on 10/01/2022 3:15:44 PM  - no acute changes.   Lab Results  Component Value Date   WBC 9.9 09/23/2022   HGB 10.0 (L) 09/23/2022   HCT 31.7 (L) 09/23/2022   MCV 92.7 09/23/2022   PLT 296 09/23/2022   Lab Results  Component Value Date   CREATININE 4.82 (H) 09/23/2022   BUN 66 (H) 09/23/2022   NA 130 (L) 09/23/2022   K 3.9 09/23/2022   CL 96 (L) 09/23/2022   CO2 21 (L) 09/23/2022   Lab Results  Component Value Date   ALT 30 09/18/2022   AST 12 (L) 09/18/2022   ALKPHOS 48 09/18/2022   BILITOT 0.7 09/18/2022   Lab Results  Component Value Date   CHOL 95 08/26/2021   HDL 35 (L) 08/26/2021   LDLCALC 38 08/26/2021   TRIG 110 08/26/2021   CHOLHDL 2.7 08/26/2021    Lab Results  Component Value Date   HGBA1C 5.8 (H) 09/14/2022    Assessment & Plan   1. Persistent atrial fibrillation: Diagnosed in July 2023 following ablation for  left renal carcinoma.  Spontaneously  cardioverted to sinus rhythm.  She had a subscapular renal hematoma while on anticoagulation, Eliquis was discontinued.  Recent hospitalization with A-fib with RVR.  Labs including CBC, CMET, TSH and magnesium were stable.  HR remains elevated above goal. Medical management somewhat limited in the setting of hypotension, CKD.  Discussed with Dr. Royann Shivers, DOD, will increase amiodarone to 200 mg daily.  Continue to monitor BP, heart rate, discussed home monitoring with Kardia mobile device.  Will review with Dr. Swaziland to clarify rate control versus rhythm control strategy (per patient and patient's son this was not discussed during recent hospitalization).  Will defer anticoagulation as was determined during recent hospitalization.  Reviewed ED precautions.  Continue metoprolol.   ADDENDUM 10/11/2022: Per Dr. Swaziland, will pursue "rate control only since can't take anticoagulation." Continue "amiodarone since BP will not tolerate beta blockers or diltiazem."  2. Nonobstructive CAD: Cardiac catheterization in 08/2021 revealed nonobstructive CAD with 20% proximal RCA, 20% left circumflex, 20% left main, 30% ostial LAD, 50% proximal LAD stenoses, EF 55 to 65%.  She has stable dyspnea with exertion, denies chest pain.  No indication for ischemic evaluation at this time.  3. Orthostatic hypotension/hypertension: BP stable, she denies any dizziness, presyncope, syncope.  Continue midodrine.   4. ESRD: Recent volume overload, Lasix was previously discontinued.  She does have nonpitting bilateral lower extremity edema, left greater than right, wise euvolemic and well compensated.  Volume management per HD.   5. Type 2 diabetes: A1c was 5.8 in 08/2022.  Managed per PCP.   6. Disposition: Follow-up in 4 to 6 weeks with APP, follow-up as scheduled Dr. Swaziland in December 2024.      Joylene Grapes, NP 10/01/2022, 7:51 PM

## 2022-10-02 DIAGNOSIS — Z992 Dependence on renal dialysis: Secondary | ICD-10-CM | POA: Diagnosis not present

## 2022-10-02 DIAGNOSIS — N2581 Secondary hyperparathyroidism of renal origin: Secondary | ICD-10-CM | POA: Diagnosis not present

## 2022-10-02 DIAGNOSIS — N186 End stage renal disease: Secondary | ICD-10-CM | POA: Diagnosis not present

## 2022-10-03 DIAGNOSIS — G2581 Restless legs syndrome: Secondary | ICD-10-CM | POA: Diagnosis not present

## 2022-10-03 DIAGNOSIS — Z23 Encounter for immunization: Secondary | ICD-10-CM | POA: Diagnosis not present

## 2022-10-03 DIAGNOSIS — B0050 Herpesviral ocular disease, unspecified: Secondary | ICD-10-CM | POA: Diagnosis not present

## 2022-10-03 DIAGNOSIS — N183 Chronic kidney disease, stage 3 unspecified: Secondary | ICD-10-CM | POA: Diagnosis not present

## 2022-10-03 DIAGNOSIS — I4891 Unspecified atrial fibrillation: Secondary | ICD-10-CM | POA: Diagnosis not present

## 2022-10-04 DIAGNOSIS — G2581 Restless legs syndrome: Secondary | ICD-10-CM | POA: Diagnosis not present

## 2022-10-04 DIAGNOSIS — I5032 Chronic diastolic (congestive) heart failure: Secondary | ICD-10-CM | POA: Diagnosis not present

## 2022-10-04 DIAGNOSIS — N186 End stage renal disease: Secondary | ICD-10-CM | POA: Diagnosis not present

## 2022-10-04 DIAGNOSIS — E1122 Type 2 diabetes mellitus with diabetic chronic kidney disease: Secondary | ICD-10-CM | POA: Diagnosis not present

## 2022-10-04 DIAGNOSIS — Z794 Long term (current) use of insulin: Secondary | ICD-10-CM | POA: Diagnosis not present

## 2022-10-04 DIAGNOSIS — I132 Hypertensive heart and chronic kidney disease with heart failure and with stage 5 chronic kidney disease, or end stage renal disease: Secondary | ICD-10-CM | POA: Diagnosis not present

## 2022-10-04 DIAGNOSIS — D631 Anemia in chronic kidney disease: Secondary | ICD-10-CM | POA: Diagnosis not present

## 2022-10-04 DIAGNOSIS — I480 Paroxysmal atrial fibrillation: Secondary | ICD-10-CM | POA: Diagnosis not present

## 2022-10-04 DIAGNOSIS — Z992 Dependence on renal dialysis: Secondary | ICD-10-CM | POA: Diagnosis not present

## 2022-10-04 DIAGNOSIS — N2581 Secondary hyperparathyroidism of renal origin: Secondary | ICD-10-CM | POA: Diagnosis not present

## 2022-10-07 DIAGNOSIS — Z992 Dependence on renal dialysis: Secondary | ICD-10-CM | POA: Diagnosis not present

## 2022-10-07 DIAGNOSIS — N2581 Secondary hyperparathyroidism of renal origin: Secondary | ICD-10-CM | POA: Diagnosis not present

## 2022-10-07 DIAGNOSIS — N186 End stage renal disease: Secondary | ICD-10-CM | POA: Diagnosis not present

## 2022-10-07 DIAGNOSIS — B0239 Other herpes zoster eye disease: Secondary | ICD-10-CM | POA: Diagnosis not present

## 2022-10-08 DIAGNOSIS — Z794 Long term (current) use of insulin: Secondary | ICD-10-CM | POA: Diagnosis not present

## 2022-10-08 DIAGNOSIS — E1122 Type 2 diabetes mellitus with diabetic chronic kidney disease: Secondary | ICD-10-CM | POA: Diagnosis not present

## 2022-10-08 DIAGNOSIS — N186 End stage renal disease: Secondary | ICD-10-CM | POA: Diagnosis not present

## 2022-10-08 DIAGNOSIS — D631 Anemia in chronic kidney disease: Secondary | ICD-10-CM | POA: Diagnosis not present

## 2022-10-08 DIAGNOSIS — G2581 Restless legs syndrome: Secondary | ICD-10-CM | POA: Diagnosis not present

## 2022-10-08 DIAGNOSIS — I5032 Chronic diastolic (congestive) heart failure: Secondary | ICD-10-CM | POA: Diagnosis not present

## 2022-10-08 DIAGNOSIS — Z992 Dependence on renal dialysis: Secondary | ICD-10-CM | POA: Diagnosis not present

## 2022-10-08 DIAGNOSIS — I132 Hypertensive heart and chronic kidney disease with heart failure and with stage 5 chronic kidney disease, or end stage renal disease: Secondary | ICD-10-CM | POA: Diagnosis not present

## 2022-10-08 DIAGNOSIS — I480 Paroxysmal atrial fibrillation: Secondary | ICD-10-CM | POA: Diagnosis not present

## 2022-10-09 DIAGNOSIS — N2581 Secondary hyperparathyroidism of renal origin: Secondary | ICD-10-CM | POA: Diagnosis not present

## 2022-10-09 DIAGNOSIS — Z992 Dependence on renal dialysis: Secondary | ICD-10-CM | POA: Diagnosis not present

## 2022-10-09 DIAGNOSIS — N186 End stage renal disease: Secondary | ICD-10-CM | POA: Diagnosis not present

## 2022-10-10 ENCOUNTER — Other Ambulatory Visit (HOSPITAL_COMMUNITY): Payer: Self-pay

## 2022-10-10 ENCOUNTER — Ambulatory Visit (INDEPENDENT_AMBULATORY_CARE_PROVIDER_SITE_OTHER): Payer: Medicare PPO | Admitting: Dermatology

## 2022-10-10 VITALS — BP 108/65

## 2022-10-10 DIAGNOSIS — D692 Other nonthrombocytopenic purpura: Secondary | ICD-10-CM | POA: Diagnosis not present

## 2022-10-10 DIAGNOSIS — L570 Actinic keratosis: Secondary | ICD-10-CM

## 2022-10-10 DIAGNOSIS — L723 Sebaceous cyst: Secondary | ICD-10-CM

## 2022-10-10 DIAGNOSIS — L720 Epidermal cyst: Secondary | ICD-10-CM

## 2022-10-10 DIAGNOSIS — L820 Inflamed seborrheic keratosis: Secondary | ICD-10-CM

## 2022-10-10 NOTE — Progress Notes (Signed)
   New Patient Visit   Subjective  Kim Burns is a 87 y.o. female who presents for the following: growths on the hands and face, right chest x 6 months.  Discoloration on the right arm possibly from bruising. She had an IV 09/13/2022. She said she was told to have it checked.  No personal or family HX of skin CA.   The following portions of the chart were reviewed this encounter and updated as appropriate: medications, allergies, medical history  Review of Systems:  No other skin or systemic complaints except as noted in HPI or Assessment and Plan.  Objective  Well appearing patient in no apparent distress; mood and affect are within normal limits.             A focused examination was performed of the following areas: Right breast, right thigh, hands, face  Relevant exam findings are noted in the Assessment and Plan.  right dorsal hand, right breast,nose, left cheek (6) Erythematous thin papules/macules with gritty scale.   Right Knee - Anterior Stuck on waxy growths    Assessment & Plan   1. Actinic Keratosis - Plan: Freeze lesions with liquid nitrogen today. Schedule a follow-up appointment in six months to assess healing and re-treat if necessary. Prescribe chemotherapy cream for use in the winter to prevent new lesions.  2. Sebaceous Cysts - Assessment:  (2.5 cm x 2.5 cm, subcutaneous cystic nodule on the right posterior shoulder). - Plan: Refer patient to Dr. Rosanne Gutting for surgical removal of the bothersome cyst on the right posterior shoulder. Monitor healing and consider treatment for the other cyst on the arm in the future.  3. Purpura / Post-IV Bruising - Assessment: Confirm that the bruising is healing well. - Plan: Recommend continued use of vitamin K cream and consumption of pineapple for faster healing. Suggest using DermMend cream to help thicken the skin and reduce the chance of actinic purpura.  4. Follow-up Appointment - Assessment: Not  explicitly mentioned. - Plan: Schedule a follow-up appointment in February (six months) to assess the patient's progress and address any new concerns.   Return in about 6 months (around 04/12/2023) for AK Follow Up.  Jaclynn Guarneri, CMA, am acting as scribe for [employer], DO.   Documentation: I have reviewed the above documentation for accuracy and completeness, and I agree with the above.  Langston Reusing, DO

## 2022-10-10 NOTE — Patient Instructions (Addendum)
Hello Kim Burns,  Thank you for visiting our office today. We appreciate your dedication to maintaining your skin health and promptly addressing your concerns. Here is a summary of the key instructions and treatment plan from today's consultation:  - Treatment for Actinic Keratosis (AK):   - Procedure: Cryotherapy (freezing) was performed on the AK spots today to prevent progression to squamous cell carcinoma.   - Aftercare: Please apply Aquaphor or Vaseline twice daily to the treated areas to aid in healing.   - Precaution: Wear gloves when washing dishes or cleaning to prevent irritation.   - Sebaceous Cysts:   - Referral: A bothersome cyst under your bra strap has been noted. A referral to Dr. Caralyn Guile for surgical removal in September has been arranged.   - Post-Surgery Care: You will have stitches for two weeks, which will then be removed.  - Follow-Up Appointments:   - Schedule: Please return in six months for a follow-up to monitor the treated areas and assess if additional treatment is necessary.   - Purpose: We will also check for new AKs at that time and treat them as needed.  - Additional Recommendations:   - Skin Care: Continue using vitamin K cream on the bruised area where the IV was inserted, as it aids in faster healing.   - Diet: Consider incorporating pineapple into your diet to help with bruise resolution due to its bromelain content.  - Educational Material:   - Product Information: We have included a picture of DermMend cream in your summary. This cream contains vitamin K and retinol, which can help thicken the skin and reduce the risk of actinic purpura.  We look forward to seeing you again in February to continue caring for your skin. If you have any questions or concerns before then, please do not hesitate to contact our office.  Warm regards,  Dr. Langston Reusing, Dermatology          Cryotherapy Aftercare  Wash gently with soap and water everyday.   Apply  Vaseline and Band-Aid daily until healed.   Important Information  Due to recent changes in healthcare laws, you may see results of your pathology and/or laboratory studies on MyChart before the doctors have had a chance to review them. We understand that in some cases there may be results that are confusing or concerning to you. Please understand that not all results are received at the same time and often the doctors may need to interpret multiple results in order to provide you with the best plan of care or course of treatment. Therefore, we ask that you please give [place] 2 business days to thoroughly review all your results before contacting the office for clarification. Should we see a critical lab result, you will be contacted sooner.   If You Need Anything After Your Visit  If you have any questions or concerns for your doctor, please call our main line at [phone] If no one answers, please leave a voicemail as directed and we will return your call as soon as possible. Messages left after 4 pm will be answered the following business day.   You may also send [place] a message via MyChart. We typically respond to MyChart messages within 1-2 business days.  For prescription refills, please ask your pharmacy to contact our office. Our fax number is [phone].  If you have an urgent issue when the clinic is closed that cannot wait until the next business day, you can page your doctor at the  number below.    Please note that while we do our best to be available for urgent issues outside of office hours, we are not available 24/7.   If you have an urgent issue and are unable to reach [place], you may choose to seek medical care at your doctor's office, retail clinic, urgent care center, or emergency room.  If you have a medical emergency, please immediately call 911 or go to the emergency department. In the event of inclement weather, please call our main line at [phone] for an update on the  status of any delays or closures.  Dermatology Medication Tips: Please keep the boxes that topical medications come in in order to help keep track of the instructions about where and how to use these. Pharmacies typically print the medication instructions only on the boxes and not directly on the medication tubes.   If your medication is too expensive, please contact our office at [phone] or send [place] a message through MyChart.   We are unable to tell what your co-pay for medications will be in advance as this is different depending on your insurance coverage. However, we may be able to find a substitute medication at lower cost or fill out paperwork to get insurance to cover a needed medication.   If a prior authorization is required to get your medication covered by your insurance company, please allow [place] 1-2 business days to complete this process.  Drug prices often vary depending on where the prescription is filled and some pharmacies may offer cheaper prices.  The website www.goodrx.com contains coupons for medications through different pharmacies. The prices here do not account for what the cost may be with help from insurance (it may be cheaper with your insurance), but the website can give you the price if you did not use any insurance.  - You can print the associated coupon and take it with your prescription to the pharmacy.  - You may also stop by our office during regular business hours and pick up a GoodRx coupon card.  - If you need your prescription sent electronically to a different pharmacy, notify our office through [facility] or by phone at [phone]

## 2022-10-11 DIAGNOSIS — Z992 Dependence on renal dialysis: Secondary | ICD-10-CM | POA: Diagnosis not present

## 2022-10-11 DIAGNOSIS — N2581 Secondary hyperparathyroidism of renal origin: Secondary | ICD-10-CM | POA: Diagnosis not present

## 2022-10-11 DIAGNOSIS — N186 End stage renal disease: Secondary | ICD-10-CM | POA: Diagnosis not present

## 2022-10-14 ENCOUNTER — Telehealth: Payer: Self-pay

## 2022-10-14 DIAGNOSIS — Z992 Dependence on renal dialysis: Secondary | ICD-10-CM | POA: Diagnosis not present

## 2022-10-14 DIAGNOSIS — N186 End stage renal disease: Secondary | ICD-10-CM | POA: Diagnosis not present

## 2022-10-14 DIAGNOSIS — N2581 Secondary hyperparathyroidism of renal origin: Secondary | ICD-10-CM | POA: Diagnosis not present

## 2022-10-14 NOTE — Telephone Encounter (Signed)
Pt's son called saying he thought Dr. Onalee Hua said she was going to send a prescription for preventative treatment for precancers. I l/m for him that Dr. Onalee Hua would reevaluate at the next office visit and if she feels 5FU would be beneficial for pt we would order it at that point

## 2022-10-15 ENCOUNTER — Telehealth: Payer: Self-pay | Admitting: Nurse Practitioner

## 2022-10-15 NOTE — Telephone Encounter (Signed)
Pt son called in stating pt's amiodarone was increased at the last appt. He called in asking what hr is too low. He states pt is not having any symptoms. Please advise.   50's sitting down  70's moving around

## 2022-10-15 NOTE — Telephone Encounter (Signed)
Returned call to pt's son [name]. LVM with instruction from Dr. [place].       I would continue current therapy. Not concerned unless HR less than 50 or symptomatic  Peter [place] MD, [facility]

## 2022-10-15 NOTE — Telephone Encounter (Signed)
Returned call to pt's son in regards to her BP. Pt saw Bernadene Person, NP recently and her amiodarone was increased to 200mg  daily. Pt is having a resting HR of mid 50's and with activity it is mid 70's. Pt has no symptoms at all with either HR's. BP is running fine per pt's son. 110's-130's over 70's-80's. Please call pt's son Kim Burns at [phone]. Please advise.

## 2022-10-16 DIAGNOSIS — N186 End stage renal disease: Secondary | ICD-10-CM | POA: Diagnosis not present

## 2022-10-16 DIAGNOSIS — Z992 Dependence on renal dialysis: Secondary | ICD-10-CM | POA: Diagnosis not present

## 2022-10-16 DIAGNOSIS — N2581 Secondary hyperparathyroidism of renal origin: Secondary | ICD-10-CM | POA: Diagnosis not present

## 2022-10-17 DIAGNOSIS — E109 Type 1 diabetes mellitus without complications: Secondary | ICD-10-CM | POA: Diagnosis not present

## 2022-10-18 DIAGNOSIS — N2581 Secondary hyperparathyroidism of renal origin: Secondary | ICD-10-CM | POA: Diagnosis not present

## 2022-10-18 DIAGNOSIS — N186 End stage renal disease: Secondary | ICD-10-CM | POA: Diagnosis not present

## 2022-10-18 DIAGNOSIS — Z992 Dependence on renal dialysis: Secondary | ICD-10-CM | POA: Diagnosis not present

## 2022-10-19 DIAGNOSIS — I158 Other secondary hypertension: Secondary | ICD-10-CM | POA: Diagnosis not present

## 2022-10-19 DIAGNOSIS — Z992 Dependence on renal dialysis: Secondary | ICD-10-CM | POA: Diagnosis not present

## 2022-10-19 DIAGNOSIS — N186 End stage renal disease: Secondary | ICD-10-CM | POA: Diagnosis not present

## 2022-10-21 ENCOUNTER — Other Ambulatory Visit: Payer: Self-pay

## 2022-10-21 ENCOUNTER — Emergency Department (HOSPITAL_COMMUNITY): Payer: Medicare PPO

## 2022-10-21 ENCOUNTER — Encounter (HOSPITAL_COMMUNITY): Payer: Self-pay | Admitting: *Deleted

## 2022-10-21 ENCOUNTER — Inpatient Hospital Stay (HOSPITAL_COMMUNITY): Payer: Medicare PPO

## 2022-10-21 ENCOUNTER — Inpatient Hospital Stay (HOSPITAL_COMMUNITY)
Admission: EM | Admit: 2022-10-21 | Discharge: 2022-10-30 | DRG: 963 | Disposition: A | Discharge status: Rehabilitation Facility | Payer: Medicare PPO | Attending: Internal Medicine | Admitting: Internal Medicine

## 2022-10-21 DIAGNOSIS — S061X9A Traumatic cerebral edema with loss of consciousness of unspecified duration, initial encounter: Secondary | ICD-10-CM | POA: Diagnosis not present

## 2022-10-21 DIAGNOSIS — M5136 Other intervertebral disc degeneration, lumbar region: Secondary | ICD-10-CM | POA: Diagnosis not present

## 2022-10-21 DIAGNOSIS — Z8619 Personal history of other infectious and parasitic diseases: Secondary | ICD-10-CM | POA: Diagnosis not present

## 2022-10-21 DIAGNOSIS — M25552 Pain in left hip: Secondary | ICD-10-CM | POA: Diagnosis not present

## 2022-10-21 DIAGNOSIS — K8681 Exocrine pancreatic insufficiency: Secondary | ICD-10-CM

## 2022-10-21 DIAGNOSIS — I4819 Other persistent atrial fibrillation: Secondary | ICD-10-CM | POA: Diagnosis present

## 2022-10-21 DIAGNOSIS — I6310 Cerebral infarction due to embolism of unspecified precerebral artery: Secondary | ICD-10-CM | POA: Diagnosis not present

## 2022-10-21 DIAGNOSIS — R1310 Dysphagia, unspecified: Secondary | ICD-10-CM | POA: Diagnosis not present

## 2022-10-21 DIAGNOSIS — I6349 Cerebral infarction due to embolism of other cerebral artery: Secondary | ICD-10-CM | POA: Diagnosis not present

## 2022-10-21 DIAGNOSIS — E1122 Type 2 diabetes mellitus with diabetic chronic kidney disease: Secondary | ICD-10-CM | POA: Diagnosis not present

## 2022-10-21 DIAGNOSIS — L89312 Pressure ulcer of right buttock, stage 2: Secondary | ICD-10-CM | POA: Diagnosis not present

## 2022-10-21 DIAGNOSIS — E1142 Type 2 diabetes mellitus with diabetic polyneuropathy: Secondary | ICD-10-CM | POA: Diagnosis not present

## 2022-10-21 DIAGNOSIS — R443 Hallucinations, unspecified: Secondary | ICD-10-CM | POA: Diagnosis not present

## 2022-10-21 DIAGNOSIS — Y831 Surgical operation with implant of artificial internal device as the cause of abnormal reaction of the patient, or of later complication, without mention of misadventure at the time of the procedure: Secondary | ICD-10-CM | POA: Diagnosis present

## 2022-10-21 DIAGNOSIS — Z881 Allergy status to other antibiotic agents status: Secondary | ICD-10-CM

## 2022-10-21 DIAGNOSIS — R569 Unspecified convulsions: Secondary | ICD-10-CM | POA: Diagnosis not present

## 2022-10-21 DIAGNOSIS — I132 Hypertensive heart and chronic kidney disease with heart failure and with stage 5 chronic kidney disease, or end stage renal disease: Secondary | ICD-10-CM | POA: Diagnosis present

## 2022-10-21 DIAGNOSIS — S065X9A Traumatic subdural hemorrhage with loss of consciousness of unspecified duration, initial encounter: Secondary | ICD-10-CM | POA: Diagnosis present

## 2022-10-21 DIAGNOSIS — I4891 Unspecified atrial fibrillation: Secondary | ICD-10-CM | POA: Diagnosis not present

## 2022-10-21 DIAGNOSIS — S0631AA Contusion and laceration of right cerebrum with loss of consciousness status unknown, initial encounter: Secondary | ICD-10-CM | POA: Diagnosis not present

## 2022-10-21 DIAGNOSIS — R410 Disorientation, unspecified: Secondary | ICD-10-CM | POA: Diagnosis not present

## 2022-10-21 DIAGNOSIS — S069X9A Unspecified intracranial injury with loss of consciousness of unspecified duration, initial encounter: Secondary | ICD-10-CM | POA: Diagnosis not present

## 2022-10-21 DIAGNOSIS — M109 Gout, unspecified: Secondary | ICD-10-CM | POA: Diagnosis present

## 2022-10-21 DIAGNOSIS — G2581 Restless legs syndrome: Secondary | ICD-10-CM | POA: Diagnosis not present

## 2022-10-21 DIAGNOSIS — Z885 Allergy status to narcotic agent status: Secondary | ICD-10-CM

## 2022-10-21 DIAGNOSIS — S32592A Other specified fracture of left pubis, initial encounter for closed fracture: Secondary | ICD-10-CM | POA: Diagnosis present

## 2022-10-21 DIAGNOSIS — S0291XA Unspecified fracture of skull, initial encounter for closed fracture: Secondary | ICD-10-CM | POA: Diagnosis not present

## 2022-10-21 DIAGNOSIS — R933 Abnormal findings on diagnostic imaging of other parts of digestive tract: Secondary | ICD-10-CM | POA: Diagnosis present

## 2022-10-21 DIAGNOSIS — I480 Paroxysmal atrial fibrillation: Secondary | ICD-10-CM | POA: Diagnosis not present

## 2022-10-21 DIAGNOSIS — R42 Dizziness and giddiness: Secondary | ICD-10-CM | POA: Diagnosis not present

## 2022-10-21 DIAGNOSIS — I5021 Acute systolic (congestive) heart failure: Secondary | ICD-10-CM | POA: Diagnosis not present

## 2022-10-21 DIAGNOSIS — M47812 Spondylosis without myelopathy or radiculopathy, cervical region: Secondary | ICD-10-CM | POA: Diagnosis not present

## 2022-10-21 DIAGNOSIS — R001 Bradycardia, unspecified: Secondary | ICD-10-CM | POA: Diagnosis not present

## 2022-10-21 DIAGNOSIS — I953 Hypotension of hemodialysis: Secondary | ICD-10-CM | POA: Diagnosis present

## 2022-10-21 DIAGNOSIS — T8383XA Hemorrhage of genitourinary prosthetic devices, implants and grafts, initial encounter: Secondary | ICD-10-CM | POA: Diagnosis present

## 2022-10-21 DIAGNOSIS — S069X1D Unspecified intracranial injury with loss of consciousness of 30 minutes or less, subsequent encounter: Secondary | ICD-10-CM | POA: Diagnosis not present

## 2022-10-21 DIAGNOSIS — S06319A Contusion and laceration of right cerebrum with loss of consciousness of unspecified duration, initial encounter: Principal | ICD-10-CM | POA: Diagnosis present

## 2022-10-21 DIAGNOSIS — M1612 Unilateral primary osteoarthritis, left hip: Secondary | ICD-10-CM | POA: Diagnosis not present

## 2022-10-21 DIAGNOSIS — S069X9D Unspecified intracranial injury with loss of consciousness of unspecified duration, subsequent encounter: Secondary | ICD-10-CM | POA: Diagnosis not present

## 2022-10-21 DIAGNOSIS — D181 Lymphangioma, any site: Secondary | ICD-10-CM | POA: Diagnosis present

## 2022-10-21 DIAGNOSIS — H547 Unspecified visual loss: Secondary | ICD-10-CM | POA: Diagnosis present

## 2022-10-21 DIAGNOSIS — I878 Other specified disorders of veins: Secondary | ICD-10-CM | POA: Diagnosis not present

## 2022-10-21 DIAGNOSIS — S069XAD Unspecified intracranial injury with loss of consciousness status unknown, subsequent encounter: Secondary | ICD-10-CM | POA: Diagnosis not present

## 2022-10-21 DIAGNOSIS — E1165 Type 2 diabetes mellitus with hyperglycemia: Secondary | ICD-10-CM | POA: Diagnosis present

## 2022-10-21 DIAGNOSIS — S0003XA Contusion of scalp, initial encounter: Secondary | ICD-10-CM | POA: Diagnosis not present

## 2022-10-21 DIAGNOSIS — Z87442 Personal history of urinary calculi: Secondary | ICD-10-CM

## 2022-10-21 DIAGNOSIS — R079 Chest pain, unspecified: Secondary | ICD-10-CM | POA: Diagnosis not present

## 2022-10-21 DIAGNOSIS — I10 Essential (primary) hypertension: Secondary | ICD-10-CM

## 2022-10-21 DIAGNOSIS — G319 Degenerative disease of nervous system, unspecified: Secondary | ICD-10-CM | POA: Diagnosis not present

## 2022-10-21 DIAGNOSIS — Z8249 Family history of ischemic heart disease and other diseases of the circulatory system: Secondary | ICD-10-CM

## 2022-10-21 DIAGNOSIS — S06320A Contusion and laceration of left cerebrum without loss of consciousness, initial encounter: Secondary | ICD-10-CM | POA: Diagnosis not present

## 2022-10-21 DIAGNOSIS — I6381 Other cerebral infarction due to occlusion or stenosis of small artery: Secondary | ICD-10-CM | POA: Diagnosis not present

## 2022-10-21 DIAGNOSIS — Z85528 Personal history of other malignant neoplasm of kidney: Secondary | ICD-10-CM

## 2022-10-21 DIAGNOSIS — M4302 Spondylolysis, cervical region: Secondary | ICD-10-CM | POA: Diagnosis present

## 2022-10-21 DIAGNOSIS — L89321 Pressure ulcer of left buttock, stage 1: Secondary | ICD-10-CM | POA: Diagnosis present

## 2022-10-21 DIAGNOSIS — F32A Depression, unspecified: Secondary | ICD-10-CM | POA: Diagnosis present

## 2022-10-21 DIAGNOSIS — L89316 Pressure-induced deep tissue damage of right buttock: Secondary | ICD-10-CM | POA: Diagnosis not present

## 2022-10-21 DIAGNOSIS — N2581 Secondary hyperparathyroidism of renal origin: Secondary | ICD-10-CM | POA: Diagnosis not present

## 2022-10-21 DIAGNOSIS — Z992 Dependence on renal dialysis: Secondary | ICD-10-CM | POA: Diagnosis not present

## 2022-10-21 DIAGNOSIS — I6523 Occlusion and stenosis of bilateral carotid arteries: Secondary | ICD-10-CM | POA: Diagnosis not present

## 2022-10-21 DIAGNOSIS — Y92010 Kitchen of single-family (private) house as the place of occurrence of the external cause: Secondary | ICD-10-CM | POA: Diagnosis not present

## 2022-10-21 DIAGNOSIS — Z794 Long term (current) use of insulin: Secondary | ICD-10-CM

## 2022-10-21 DIAGNOSIS — S069X0D Unspecified intracranial injury without loss of consciousness, subsequent encounter: Secondary | ICD-10-CM | POA: Diagnosis not present

## 2022-10-21 DIAGNOSIS — I639 Cerebral infarction, unspecified: Secondary | ICD-10-CM | POA: Diagnosis not present

## 2022-10-21 DIAGNOSIS — S069X1A Unspecified intracranial injury with loss of consciousness of 30 minutes or less, initial encounter: Secondary | ICD-10-CM | POA: Diagnosis not present

## 2022-10-21 DIAGNOSIS — Z7982 Long term (current) use of aspirin: Secondary | ICD-10-CM

## 2022-10-21 DIAGNOSIS — L8990 Pressure ulcer of unspecified site, unspecified stage: Secondary | ICD-10-CM | POA: Insufficient documentation

## 2022-10-21 DIAGNOSIS — S020XXA Fracture of vault of skull, initial encounter for closed fracture: Secondary | ICD-10-CM | POA: Diagnosis not present

## 2022-10-21 DIAGNOSIS — M19071 Primary osteoarthritis, right ankle and foot: Secondary | ICD-10-CM | POA: Diagnosis not present

## 2022-10-21 DIAGNOSIS — Z820 Family history of epilepsy and other diseases of the nervous system: Secondary | ICD-10-CM

## 2022-10-21 DIAGNOSIS — W19XXXA Unspecified fall, initial encounter: Secondary | ICD-10-CM | POA: Diagnosis not present

## 2022-10-21 DIAGNOSIS — S0990XA Unspecified injury of head, initial encounter: Secondary | ICD-10-CM | POA: Diagnosis not present

## 2022-10-21 DIAGNOSIS — Z90710 Acquired absence of both cervix and uterus: Secondary | ICD-10-CM

## 2022-10-21 DIAGNOSIS — S069X0A Unspecified intracranial injury without loss of consciousness, initial encounter: Secondary | ICD-10-CM | POA: Diagnosis not present

## 2022-10-21 DIAGNOSIS — Z90411 Acquired partial absence of pancreas: Secondary | ICD-10-CM

## 2022-10-21 DIAGNOSIS — Z8673 Personal history of transient ischemic attack (TIA), and cerebral infarction without residual deficits: Secondary | ICD-10-CM

## 2022-10-21 DIAGNOSIS — I700 Atherosclerosis of aorta: Secondary | ICD-10-CM | POA: Diagnosis not present

## 2022-10-21 DIAGNOSIS — Z888 Allergy status to other drugs, medicaments and biological substances status: Secondary | ICD-10-CM

## 2022-10-21 DIAGNOSIS — M2011 Hallux valgus (acquired), right foot: Secondary | ICD-10-CM | POA: Diagnosis not present

## 2022-10-21 DIAGNOSIS — G936 Cerebral edema: Secondary | ICD-10-CM | POA: Diagnosis not present

## 2022-10-21 DIAGNOSIS — E559 Vitamin D deficiency, unspecified: Secondary | ICD-10-CM | POA: Diagnosis present

## 2022-10-21 DIAGNOSIS — S0282XD Fracture of other specified skull and facial bones, left side, subsequent encounter for fracture with routine healing: Secondary | ICD-10-CM | POA: Diagnosis not present

## 2022-10-21 DIAGNOSIS — W1830XA Fall on same level, unspecified, initial encounter: Secondary | ICD-10-CM | POA: Diagnosis present

## 2022-10-21 DIAGNOSIS — K219 Gastro-esophageal reflux disease without esophagitis: Secondary | ICD-10-CM | POA: Diagnosis present

## 2022-10-21 DIAGNOSIS — S02119A Unspecified fracture of occiput, initial encounter for closed fracture: Secondary | ICD-10-CM | POA: Diagnosis not present

## 2022-10-21 DIAGNOSIS — R4182 Altered mental status, unspecified: Secondary | ICD-10-CM | POA: Diagnosis not present

## 2022-10-21 DIAGNOSIS — N250 Renal osteodystrophy: Secondary | ICD-10-CM | POA: Diagnosis not present

## 2022-10-21 DIAGNOSIS — S06330A Contusion and laceration of cerebrum, unspecified, without loss of consciousness, initial encounter: Secondary | ICD-10-CM | POA: Diagnosis not present

## 2022-10-21 DIAGNOSIS — R55 Syncope and collapse: Secondary | ICD-10-CM | POA: Diagnosis not present

## 2022-10-21 DIAGNOSIS — H538 Other visual disturbances: Secondary | ICD-10-CM | POA: Diagnosis not present

## 2022-10-21 DIAGNOSIS — I120 Hypertensive chronic kidney disease with stage 5 chronic kidney disease or end stage renal disease: Secondary | ICD-10-CM | POA: Diagnosis not present

## 2022-10-21 DIAGNOSIS — S3219XA Other fracture of sacrum, initial encounter for closed fracture: Secondary | ICD-10-CM | POA: Diagnosis present

## 2022-10-21 DIAGNOSIS — Z436 Encounter for attention to other artificial openings of urinary tract: Secondary | ICD-10-CM | POA: Diagnosis not present

## 2022-10-21 DIAGNOSIS — I2510 Atherosclerotic heart disease of native coronary artery without angina pectoris: Secondary | ICD-10-CM | POA: Diagnosis present

## 2022-10-21 DIAGNOSIS — Z9049 Acquired absence of other specified parts of digestive tract: Secondary | ICD-10-CM

## 2022-10-21 DIAGNOSIS — S32402D Unspecified fracture of left acetabulum, subsequent encounter for fracture with routine healing: Secondary | ICD-10-CM | POA: Diagnosis not present

## 2022-10-21 DIAGNOSIS — R54 Age-related physical debility: Secondary | ICD-10-CM | POA: Diagnosis present

## 2022-10-21 DIAGNOSIS — Z823 Family history of stroke: Secondary | ICD-10-CM

## 2022-10-21 DIAGNOSIS — S3210XA Unspecified fracture of sacrum, initial encounter for closed fracture: Secondary | ICD-10-CM

## 2022-10-21 DIAGNOSIS — N186 End stage renal disease: Secondary | ICD-10-CM | POA: Diagnosis not present

## 2022-10-21 DIAGNOSIS — R000 Tachycardia, unspecified: Secondary | ICD-10-CM | POA: Diagnosis not present

## 2022-10-21 DIAGNOSIS — D631 Anemia in chronic kidney disease: Secondary | ICD-10-CM | POA: Diagnosis not present

## 2022-10-21 DIAGNOSIS — F5104 Psychophysiologic insomnia: Secondary | ICD-10-CM | POA: Diagnosis present

## 2022-10-21 DIAGNOSIS — R296 Repeated falls: Secondary | ICD-10-CM | POA: Diagnosis present

## 2022-10-21 DIAGNOSIS — H8110 Benign paroxysmal vertigo, unspecified ear: Secondary | ICD-10-CM | POA: Diagnosis not present

## 2022-10-21 DIAGNOSIS — R5381 Other malaise: Secondary | ICD-10-CM | POA: Diagnosis not present

## 2022-10-21 DIAGNOSIS — S069XAA Unspecified intracranial injury with loss of consciousness status unknown, initial encounter: Secondary | ICD-10-CM | POA: Diagnosis present

## 2022-10-21 DIAGNOSIS — Z936 Other artificial openings of urinary tract status: Secondary | ICD-10-CM

## 2022-10-21 DIAGNOSIS — F419 Anxiety disorder, unspecified: Secondary | ICD-10-CM | POA: Diagnosis present

## 2022-10-21 DIAGNOSIS — H02401 Unspecified ptosis of right eyelid: Secondary | ICD-10-CM | POA: Diagnosis present

## 2022-10-21 DIAGNOSIS — E785 Hyperlipidemia, unspecified: Secondary | ICD-10-CM | POA: Diagnosis present

## 2022-10-21 DIAGNOSIS — S199XXA Unspecified injury of neck, initial encounter: Secondary | ICD-10-CM | POA: Diagnosis not present

## 2022-10-21 DIAGNOSIS — Z79899 Other long term (current) drug therapy: Secondary | ICD-10-CM

## 2022-10-21 DIAGNOSIS — S060XAA Concussion with loss of consciousness status unknown, initial encounter: Secondary | ICD-10-CM | POA: Diagnosis not present

## 2022-10-21 DIAGNOSIS — S069X9S Unspecified intracranial injury with loss of consciousness of unspecified duration, sequela: Secondary | ICD-10-CM | POA: Diagnosis not present

## 2022-10-21 LAB — BASIC METABOLIC PANEL
Anion gap: 14 (ref 5–15)
BUN: 40 mg/dL — ABNORMAL HIGH (ref 8–23)
CO2: 20 mmol/L — ABNORMAL LOW (ref 22–32)
Calcium: 8.3 mg/dL — ABNORMAL LOW (ref 8.9–10.3)
Chloride: 98 mmol/L (ref 98–111)
Creatinine, Ser: 5.37 mg/dL — ABNORMAL HIGH (ref 0.44–1.00)
GFR, Estimated: 7 mL/min — ABNORMAL LOW (ref >60)
Glucose, Bld: 153 mg/dL — ABNORMAL HIGH (ref 70–99)
Potassium: 3.9 mmol/L (ref 3.5–5.1)
Sodium: 132 mmol/L — ABNORMAL LOW (ref 135–145)

## 2022-10-21 LAB — CBC WITH DIFFERENTIAL/PLATELET
Abs Immature Granulocytes: 0.07 10*3/uL (ref 0.00–0.07)
Basophils Absolute: 0 10*3/uL (ref 0.0–0.1)
Basophils Relative: 0 %
Eosinophils Absolute: 0.1 10*3/uL (ref 0.0–0.5)
Eosinophils Relative: 1 %
HCT: 38.6 % (ref 36.0–46.0)
Hemoglobin: 12.1 g/dL (ref 12.0–15.0)
Immature Granulocytes: 1 %
Lymphocytes Relative: 9 %
Lymphs Abs: 1 10*3/uL (ref 0.7–4.0)
MCH: 31.1 pg (ref 26.0–34.0)
MCHC: 31.3 g/dL (ref 30.0–36.0)
MCV: 99.2 fL (ref 80.0–100.0)
Monocytes Absolute: 0.7 10*3/uL (ref 0.1–1.0)
Monocytes Relative: 6 %
Neutro Abs: 9.5 10*3/uL — ABNORMAL HIGH (ref 1.7–7.7)
Neutrophils Relative %: 83 %
Platelets: 175 10*3/uL (ref 150–400)
RBC: 3.89 MIL/uL (ref 3.87–5.11)
RDW: 18 % — ABNORMAL HIGH (ref 11.5–15.5)
WBC: 11.3 10*3/uL — ABNORMAL HIGH (ref 4.0–10.5)
nRBC: 0 % (ref 0.0–0.2)

## 2022-10-21 LAB — GLUCOSE, CAPILLARY
Glucose-Capillary: 96 mg/dL (ref 70–99)
Glucose-Capillary: 131 mg/dL — ABNORMAL HIGH (ref 70–99)
Glucose-Capillary: 109 mg/dL — ABNORMAL HIGH (ref 70–99)
Glucose-Capillary: 107 mg/dL — ABNORMAL HIGH (ref 70–99)

## 2022-10-21 LAB — HEPATITIS B SURFACE ANTIGEN: Hepatitis B Surface Ag: NONREACTIVE

## 2022-10-21 LAB — TROPONIN I (HIGH SENSITIVITY)
Troponin I (High Sensitivity): 16 ng/L (ref <18)
Troponin I (High Sensitivity): 16 ng/L (ref <18)

## 2022-10-21 LAB — MRSA NEXT GEN BY PCR, NASAL: MRSA by PCR Next Gen: NOT DETECTED

## 2022-10-21 MED ORDER — VALACYCLOVIR HCL 500 MG PO TABS
500.0000 mg | ORAL_TABLET | Freq: Every day | ORAL | Status: DC
  Administered 2022-10-22 – 2022-10-29 (×7): 500 mg via ORAL
  Filled 2022-10-21 (×11): qty 1

## 2022-10-21 MED ORDER — CLEVIDIPINE BUTYRATE 0.5 MG/ML IV EMUL
0.0000 mg/h | INTRAVENOUS | Status: DC
  Filled 2022-10-21: qty 100

## 2022-10-21 MED ORDER — AMIODARONE HCL 200 MG PO TABS
200.0000 mg | ORAL_TABLET | Freq: Every day | ORAL | Status: DC
  Administered 2022-10-22 – 2022-10-23 (×2): 200 mg via ORAL
  Filled 2022-10-21 (×2): qty 1

## 2022-10-21 MED ORDER — DOCUSATE SODIUM 100 MG PO CAPS
100.0000 mg | ORAL_CAPSULE | Freq: Two times a day (BID) | ORAL | Status: DC | PRN
  Filled 2022-10-21 (×2): qty 1

## 2022-10-21 MED ORDER — CHLORHEXIDINE GLUCONATE CLOTH 2 % EX PADS
6.0000 | MEDICATED_PAD | Freq: Every day | CUTANEOUS | Status: DC
  Administered 2022-10-21 – 2022-10-30 (×9): 6 via TOPICAL

## 2022-10-21 MED ORDER — ONDANSETRON HCL 4 MG/2ML IJ SOLN: 4.0000 mg | Freq: Four times a day (QID) | INTRAMUSCULAR | Status: DC | PRN

## 2022-10-21 MED ORDER — TRAMADOL HCL 50 MG PO TABS
50.0000 mg | ORAL_TABLET | Freq: Two times a day (BID) | ORAL | Status: DC | PRN
  Administered 2022-10-21 – 2022-10-22 (×3): 50 mg via ORAL
  Filled 2022-10-21 (×4): qty 1

## 2022-10-21 MED ORDER — HYDRALAZINE HCL 20 MG/ML IJ SOLN
10.0000 mg | INTRAMUSCULAR | Status: DC | PRN
  Administered 2022-10-21 – 2022-10-27 (×3): 10 mg via INTRAVENOUS
  Filled 2022-10-21 (×3): qty 1

## 2022-10-21 MED ORDER — OLOPATADINE HCL 0.1 % OP SOLN
1.0000 [drp] | Freq: Two times a day (BID) | OPHTHALMIC | Status: DC
  Administered 2022-10-21: 1 [drp] via OPHTHALMIC
  Filled 2022-10-21: qty 5

## 2022-10-21 MED ORDER — INSULIN ASPART 100 UNIT/ML IJ SOLN
0.0000 [IU] | Freq: Three times a day (TID) | INTRAMUSCULAR | Status: DC
  Administered 2022-10-30: 1 [IU] via SUBCUTANEOUS

## 2022-10-21 MED ORDER — PREDNISOLONE ACETATE 1 % OP SUSP
1.0000 [drp] | Freq: Two times a day (BID) | OPHTHALMIC | Status: DC
  Administered 2022-10-21 – 2022-10-28 (×9): 1 [drp] via OPHTHALMIC
  Filled 2022-10-21: qty 5

## 2022-10-21 MED ORDER — CHLORHEXIDINE GLUCONATE CLOTH 2 % EX PADS: 6.0000 | MEDICATED_PAD | Freq: Every day | CUTANEOUS | Status: DC

## 2022-10-21 MED ORDER — OLOPATADINE HCL 0.1 % OP SOLN
1.0000 [drp] | Freq: Every day | OPHTHALMIC | Status: DC | PRN
  Filled 2022-10-21: qty 5

## 2022-10-21 MED ORDER — ROPINIROLE HCL 0.25 MG PO TABS
0.2500 mg | ORAL_TABLET | Freq: Two times a day (BID) | ORAL | Status: DC | PRN
  Administered 2022-10-22 – 2022-10-23 (×2): 0.25 mg via ORAL
  Filled 2022-10-21 (×4): qty 1

## 2022-10-21 MED ORDER — ORAL CARE MOUTH RINSE: 15.0000 mL | OROMUCOSAL | Status: DC | PRN

## 2022-10-21 MED ORDER — POLYETHYLENE GLYCOL 3350 17 G PO PACK: 17.0000 g | PACK | Freq: Every day | ORAL | Status: DC | PRN

## 2022-10-21 MED ORDER — CYCLOSPORINE 0.05 % OP EMUL: 1.0000 [drp] | Freq: Every day | OPHTHALMIC | Status: DC | PRN

## 2022-10-21 MED ORDER — ACETAMINOPHEN 325 MG PO TABS
650.0000 mg | ORAL_TABLET | Freq: Four times a day (QID) | ORAL | Status: DC | PRN
  Administered 2022-10-21 – 2022-10-26 (×4): 650 mg via ORAL
  Filled 2022-10-21 (×4): qty 2

## 2022-10-21 MED ORDER — PANCRELIPASE (LIP-PROT-AMYL) 36000-114000 UNITS PO CPEP
36000.0000 [IU] | ORAL_CAPSULE | Freq: Two times a day (BID) | ORAL | Status: DC
  Administered 2022-10-22 – 2022-10-29 (×6): 36000 [IU] via ORAL
  Filled 2022-10-21 (×22): qty 1

## 2022-10-21 NOTE — ED Provider Notes (Signed)
[place] EMERGENCY DEPARTMENT AT [facility] Provider Note   CSN: [id number] Arrival date & time: 10/21/22  4315     History  Chief Complaint  Patient presents with   Kim Burns is a 87 y.o. female.  Patient is a 87 year old female with a history of end-stage renal disease on dialysis, atrial fibrillation not currently on anticoagulants, hypertension, diabetes who presents after syncopal episode.  She typically does dialysis Monday Wednesday Friday and she was up in the kitchen apparently getting ready for dialysis.  Her son-in-law found her lying prone on the kitchen floor.  She was arousable at that time and was complaining of having some vision changes such as black spots in her vision which subsequently normalized.  The patient does not remember falling.  She does not know what caused her to pass out.  She has not had any recent illnesses over the last couple days.  No chest pain or shortness of breath.  No palpitations.  No vomiting or diarrhea.  She complains of pain to the back of her head and was noted to have a hematoma there.  She denies any other injuries.  She was in the hospital recently and had some changes in her medication.  Per her son-in-law, her blood pressure has been a little bit lower than it typically is.       Home Medications Prior to Admission medications   Medication Sig Start Date End Date Taking? Authorizing Provider  acetaminophen (TYLENOL) 325 MG tablet Take 2 tablets (650 mg total) by mouth every 6 (six) hours as needed for mild pain, fever or headache. Patient taking differently: Take 500 mg by mouth every 6 (six) hours as needed for mild pain, fever or headache. 01/30/22   Elgergawy, Leana Roe, MD  amiodarone (PACERONE) 200 MG tablet Take 1 tablet (200 mg total) by mouth daily. 10/01/22   Joylene Grapes, NP  B Complex-C-Zn-Folic Acid (DIALYVITE 800 WITH ZINC) 0.8 MG TABS Take 1 tablet by mouth daily.    [provider]   Brinzolamide-Brimonidine 1-0.2 % SUSP Apply 1 drop to eye 2 (two) times daily.    [provider]  Cholecalciferol (VITAMIN D-3 PO) Take 1 capsule by mouth every Monday, Wednesday, and Friday with hemodialysis.    [provider]  colestipol (COLESTID) 1 g tablet Take 1 g by mouth See admin instructions. 1 tablet (1g) prior to dialysis M-W-F + 1 tablet daily PRN diarrhea    [provider]  diclofenac sodium (VOLTAREN) 1 % GEL Apply 2 g topically 4 (four) times daily as needed (for hand pain).    [provider]  diclofenac Sodium (VOLTAREN) 1 % GEL Apply 2 g topically 3 (three) times daily as needed (pain). 09/24/22   Rodolph Bong, MD  erythromycin ophthalmic ointment Place into the left eye 2 (two) times daily. 09/24/22   Rodolph Bong, MD  folic acid (FOLVITE) 1 MG tablet Take 1 tablet (1 mg total) by mouth daily. 01/31/22   Elgergawy, Leana Roe, MD  insulin aspart (NOVOLOG) 100 UNIT/ML injection Inject 0-6 Units into the skin 3 (three) times daily with meals. Patient taking differently: Inject 0-6 Units into the skin See admin instructions. Only if sugar is >200 for at least an hour 01/30/22   Elgergawy, Leana Roe, MD  lidocaine-prilocaine (EMLA) cream Apply 1 Application topically every Monday, Wednesday, and Friday with hemodialysis.    [provider]  lipase/protease/amylase (CREON) 36000 UNITS  CPEP capsule Take 36,000 Units by mouth 2 (two) times daily with a meal.    [provider]  Menthol-Methyl Salicylate (MUSCLE RUB) 10-15 % CREA Apply 1 Application topically as needed for muscle pain. 09/24/22   Rodolph Bong, MD  metoprolol tartrate (LOPRESSOR) 50 MG tablet Take 1 tablet (50 mg total) by mouth 2 (two) times daily. 09/24/22   Rodolph Bong, MD  midodrine (PROAMATINE) 10 MG tablet Take 10 mg on dialysis days Mon-Wed-Fri 06/13/22   [place], Peter M, MD  olopatadine (PATADAY) 0.1 % ophthalmic solution Place 1 drop into both  eyes 2 (two) times daily.    [provider]  prednisoLONE acetate (PRED FORTE) 1 % ophthalmic suspension Place 1 drop into the left eye 2 (two) times daily. 09/24/22   Rodolph Bong, MD  RESTASIS 0.05 % ophthalmic emulsion Place 1 drop into both eyes as needed (yes irratation). 11/06/21   [provider]  rOPINIRole (REQUIP) 0.25 MG tablet Take 1 tablet (0.25 mg total) by mouth 2 (two) times daily. 09/24/22   Rodolph Bong, MD  SENSIPAR 30 MG tablet Take 30 mg by mouth with breakfast, with lunch, and with evening meal. 12/18/12   [provider]  sevelamer carbonate (RENVELA) 800 MG tablet Take 1,600 mg by mouth 3 (three) times daily with meals.    [provider]  valACYclovir (VALTREX) 500 MG tablet Take 1 tablet (500 mg total) by mouth at bedtime. 09/24/22   Rodolph Bong, MD      Allergies    Lopid [gemfibrozil], Lotemax [loteprednol etabonate], Statins, Norco [hydrocodone-acetaminophen], Other, Vibra-tab [doxycycline], Amlodipine, Codeine, Hydrocodone, Hydrocodone-acetaminophen, Lisinopril, and Verapamil    Review of Systems   Review of Systems  Constitutional:  Negative for chills, diaphoresis, fatigue and fever.  HENT:  Negative for congestion, rhinorrhea and sneezing.   Eyes: Negative.   Respiratory:  Negative for cough, chest tightness and shortness of breath.   Cardiovascular:  Negative for chest pain and leg swelling.  Gastrointestinal:  Negative for abdominal pain, blood in stool, diarrhea, nausea and vomiting.  Genitourinary:  Negative for difficulty urinating, flank pain, frequency and hematuria.  Musculoskeletal:  Positive for back pain. Negative for arthralgias.  Skin:  Negative for rash.  Neurological:  Positive for syncope and headaches. Negative for dizziness, speech difficulty, weakness and numbness.    Physical Exam Updated Vital Signs BP (!) 171/89   Pulse 62   Temp 97.9 F (36.6 C)   Resp (!) 21   Ht 5\' 3"  (1.6 m)    Wt 54.9 kg   SpO2 100%   BMI 21.43 kg/m  Physical Exam Constitutional:      Appearance: She is well-developed.     Comments: Sleepy but will wake up and answer questions appropriately  HENT:     Head: Normocephalic.     Comments: Large hematoma noted to the left posterior scalp in the occipital region Eyes:     Pupils: Pupils are equal, round, and reactive to light.  Neck:     Comments: No pain along the cervical or thoracic spine.  There is tenderness along the mid and lower lumbosacral spine.  No step-offs or deformities. Cardiovascular:     Rate and Rhythm: Normal rate and regular rhythm.     Heart sounds: Normal heart sounds.  Pulmonary:     Effort: Pulmonary effort is normal. No respiratory distress.     Breath sounds: Normal breath sounds. No wheezing or rales.  Chest:  Chest wall: No tenderness.  Abdominal:     General: Bowel sounds are normal.     Palpations: Abdomen is soft.     Tenderness: There is no abdominal tenderness. There is no guarding or rebound.  Musculoskeletal:        General: Normal range of motion.     Comments: No pain on palpation or range of motion of the extremities  Lymphadenopathy:     Cervical: No cervical adenopathy.  Skin:    General: Skin is warm and dry.     Findings: No rash.  Neurological:     General: No focal deficit present.     Mental Status: She is oriented to person, place, and time.     ED Results / Procedures / Treatments   Labs (all labs ordered are listed, but only abnormal results are displayed) Labs Reviewed  CBC WITH DIFFERENTIAL/PLATELET - Abnormal; Notable for the following components:      Result Value   WBC 11.3 (*)    RDW 18.0 (*)    Neutro Abs 9.5 (*)    All other components within normal limits  BASIC METABOLIC PANEL - Abnormal; Notable for the following components:   Sodium 132 (*)    CO2 20 (*)    Glucose, Bld 153 (*)    BUN 40 (*)    Creatinine, Ser 5.37 (*)    Calcium 8.3 (*)    GFR, Estimated 7  (*)    All other components within normal limits  URINALYSIS, W/ REFLEX TO CULTURE (INFECTION SUSPECTED)  TROPONIN I (HIGH SENSITIVITY)  TROPONIN I (HIGH SENSITIVITY)    EKG EKG Interpretation Date/Time:  Monday October 21 2022 62:13:08 EDT Ventricular Rate:  66 PR Interval:  171 QRS Duration:  74 QT Interval:  482 QTC Calculation: 506 R Axis:   22  Text Interpretation: Sinus rhythm Low voltage, precordial leads Anteroseptal infarct, old Borderline repolarization abnormality Prolonged QT interval Confirmed by Rolan Bucco [phone]) on 10/21/2022 9:36:46 AM  Radiology CT Head Wo Contrast  Addendum Date: 10/21/2022   ADDENDUM REPORT: 10/21/2022 10:13 ADDENDUM: Study discussed by telephone with Dr. Shawna Orleans Madysun Thall on 10/21/2022 at 0956 hours. Electronically Signed   By: Odessa Fleming M.D.   On: 10/21/2022 10:13   Result Date: 10/21/2022 CLINICAL DATA:  87 year old female dialysis patient with syncope, fall. Found down. EXAM: CT HEAD WITHOUT CONTRAST TECHNIQUE: Contiguous axial images were obtained from the base of the skull through the vertex without intravenous contrast. RADIATION DOSE REDUCTION: This exam was performed according to the departmental dose-optimization program which includes automated exposure control, adjustment of the mA and/or kV according to patient size and/or use of iterative reconstruction technique. COMPARISON:  Head CT 01/31/2022.  More recent brain MRI 09/14/2022. FINDINGS: Brain: Right anterior frontal lobe, inferior frontal gyrus hemorrhagic contusion is rounded, up to 2 cm diameter. Mild adjacent edema. No significant regional mass effect. No convincing subarachnoid or subdural blood. No IVH. No ventriculomegaly. No midline shift. Basilar cisterns remain normal. Outside the area of acute parenchymal contusion the gray-white matter differentiation is stable. Vascular: Calcified atherosclerosis at the skull base. No suspicious intracranial vascular hyperdensity. Skull:  Nondisplaced left parietal and occipital bone fracture. Sinuses/Orbits: Visualized paranasal sinuses and mastoids are stable and well aerated. Other: Leftward gaze. No acute orbit or scalp *CRASH* that broad-based left posterior scalp hematoma measures up to 12 mm thickness at the occiput put and there is an underlying nondisplaced left occipital bone fracture tracking toward but not directly involving the  foramen magnum (series 4, image 20). Hematoma and the fracture tracks cephalad before dissipating. Traumatic Brain Injury Risk Stratification Skull Fracture: Nondisplaced - Moderate/mBIG 2 Subdural Hematoma (SDH): No - Low Subarachnoid Hemorrhage [facility]): No Epidural Hematoma (EDH): No - Low / mBIG 1 Cerebral contusion, intra-axial, intraparenchymal Hemorrhage (IPH): 8mm plus or multiple - High/mBIG 3 Intraventricular Hemorrhage (IVH): No - Low / mBIG 1 Midline Shift > 1mm or Edema/effacement of sulci/vents: No - Low / mBIG 1 ---------------------------------------------------- IMPRESSION: 1. Positive for acute coup contrecoup brain injury with nondisplaced left posterior skull fracture, right anterior frontal lobe hemorrhagic contusion. No midline shift or other complicating features. 2. Associated left posterior scalp hematoma. Electronically Signed: By: Odessa Fleming M.D. On: 10/21/2022 09:53   CT Lumbar Spine Wo Contrast  Result Date: 10/21/2022 CLINICAL DATA:  Back trauma, no prior imaging (Age >= 16y).  Pain EXAM: CT LUMBAR SPINE WITHOUT CONTRAST TECHNIQUE: Multidetector CT imaging of the lumbar spine was performed without intravenous contrast administration. Multiplanar CT image reconstructions were also generated. RADIATION DOSE REDUCTION: This exam was performed according to the departmental dose-optimization program which includes automated exposure control, adjustment of the mA and/or kV according to patient size and/or use of iterative reconstruction technique. COMPARISON:  06/20/2022 FINDINGS:  Segmentation: 5 lumbar type vertebrae. Alignment: No traumatic listhesis. Vertebrae: Diffuse osseous demineralization. Prior laminectomy on the right at L5. There is a subtle nondisplaced fracture of the anterior cortex of the sacrum at S4 (series 8, image 34). Lumbar vertebral body heights are maintained without evidence of fracture. No suspicious bone lesion. Paraspinal and other soft tissues: Aortic atherosclerosis. There is some mild wall thickening of the imaged distal sigmoid colon. Disc levels: Moderate-advanced multilevel degenerative disc disease of the lumbar spine. IMPRESSION: 1. Subtle nondisplaced fracture of the anterior cortex of the sacrum at S4. 2. No acute fracture or traumatic listhesis of the lumbar spine. 3. Mild wall thickening of the imaged distal sigmoid colon. Correlate for signs and symptoms of colitis. 4. Aortic atherosclerosis (ICD10-I70.0). Electronically Signed   By: Duanne Guess D.O.   On: 10/21/2022 09:50   CT Cervical Spine Wo Contrast  Result Date: 10/21/2022 CLINICAL DATA:  Neck trauma.  Head trauma. EXAM: CT CERVICAL SPINE WITHOUT CONTRAST TECHNIQUE: Multidetector CT imaging of the cervical spine was performed without intravenous contrast. Multiplanar CT image reconstructions were also generated. RADIATION DOSE REDUCTION: This exam was performed according to the departmental dose-optimization program which includes automated exposure control, adjustment of the mA and/or kV according to patient size and/or use of iterative reconstruction technique. COMPARISON:  CT cervical spine 03/02/2018. FINDINGS: Alignment: Physiologic. Skull base and vertebrae: No evidence of acute cervical spine fracture or traumatic subluxation. Soft tissues and spinal canal: No prevertebral fluid or swelling. No visible canal hematoma. Disc levels: Mildly progressive multilevel cervical spondylosis, especially at C3-4 where there is progressive loss of disc height and endplate osteophyte formation.  Spondylosis remains greatest at C5-6 and C6-7, not grossly changed from previous study. No large disc herniation identified. Upper chest: Clear lung apices. Other: Severe bilateral carotid atherosclerosis. IMPRESSION: 1. No evidence of acute cervical spine fracture, traumatic subluxation or static signs of instability. 2. Mildly progressive multilevel cervical spondylosis. 3. Severe bilateral carotid atherosclerosis. Electronically Signed   By: Carey Bullocks M.D.   On: 10/21/2022 09:48   DG Chest Portable 1 View  Result Date: 10/21/2022 CLINICAL DATA:  87 year old female with history of chest pain. EXAM: PORTABLE CHEST 1 VIEW COMPARISON:  Chest x-ray 09/14/2022. FINDINGS: Lung volumes  are normal. No consolidative airspace disease. No pleural effusions. No pneumothorax. No pulmonary nodule or mass noted. Pulmonary vasculature and the cardiomediastinal silhouette are within normal limits. Atherosclerosis in the thoracic aorta. IMPRESSION: 1. No radiographic evidence of acute cardiopulmonary disease. 2. Aortic atherosclerosis. Electronically Signed   By: Trudie Reed M.D.   On: 10/21/2022 08:45    Procedures Procedures    Medications Ordered in ED Medications  clevidipine (CLEVIPREX) infusion 0.5 mg/mL (0 mg/hr Intravenous Hold 10/21/22 1100)  hydrALAZINE (APRESOLINE) injection 10 mg (has no administration in time range)  docusate sodium (COLACE) capsule 100 mg (has no administration in time range)  polyethylene glycol (MIRALAX / GLYCOLAX) packet 17 g (has no administration in time range)  ondansetron (ZOFRAN) injection 4 mg (has no administration in time range)    ED Course/ Medical Decision Making/ A&P                                 Medical Decision Making Amount and/or Complexity of Data Reviewed Labs: ordered. Radiology: ordered.  Risk Decision regarding hospitalization.   Patient is a 87 year old female who presents after she was found on the floor by her family members.  She  does not remember how she got there.  She does not remember if she passed out or had any symptoms preceding the fall.  Her family does say that she has been tripping and falling a lot recently.  She is sleepy but will wake up and appropriately answer orientation questions.  She does not appear to have any focal neurologic deficits.  Chest x-ray portable was performed which was interpreted by me and confirmed by the radiologist to show no acute injuries.  No evident rib fractures.  No pneumonia or pneumothorax.  She had imaging studies done which show a nondisplaced skull fracture in the posterior scalp as well as a hemorrhagic contusion in the anterior frontal lobe.  She is noted to have a sacral fracture which is nondisplaced.  No other injuries are identified on imaging studies.  Her labs are reviewed and grossly nonconcerning.  Her blood pressure initially was in the 170s.  I consulted neurosurgery who recommends admission to the ICU and blood pressure goals under 160 systolic.  Will plan on starting Cleviprex.  She has an a listed allergy to calcium channel blockers but per family has not had any anaphylactic type reactions and were unclear what the allergy to the calcium channel blocker was.  Sounds like it may have been more of an intolerance but unclear.  Have discussed with the intensivist who will admit the patient.  They actually are going to hold off on the Cleviprex at this point and potentially start small burst for blood pressure management until she gets dialysis and see how her pressures do at this point.  Will be admitted to the ICU.  CRITICAL CARE Performed by: Rolan Bucco Total critical care time: 80 minutes Critical care time was exclusive of separately billable procedures and treating other patients. Critical care was necessary to treat or prevent imminent or life-threatening deterioration. Critical care was time spent personally by me on the following activities: development of  treatment plan with patient and/or surrogate as well as nursing, discussions with consultants, evaluation of patient's response to treatment, examination of patient, obtaining history from patient or surrogate, ordering and performing treatments and interventions, ordering and review of laboratory studies, ordering and review of radiographic studies, pulse oximetry and  re-evaluation of patient's condition.   Final Clinical Impression(s) / ED Diagnoses Final diagnoses:  Contusion of right cerebral hemisphere, with unknown loss of consciousness status, initial encounter (HCC)  Closed fracture of skull, unspecified bone, initial encounter (HCC)  Closed fracture of sacrum, unspecified portion of sacrum, initial encounter [facility])    Rx / DC Orders ED Discharge Orders     None         Rolan Bucco, MD 10/21/22 1135

## 2022-10-21 NOTE — ED Notes (Signed)
Provider at bedside for patient evaluation.

## 2022-10-21 NOTE — Progress Notes (Signed)
   10/21/22 1900  Vitals  Temp 98 F (36.7 C)  Temp Source Oral  BP (!) 141/110  MAP (mmHg) 120  BP Location Right Wrist  BP Method Automatic  Patient Position (if appropriate) Lying  Pulse Rate 65  ECG Heart Rate 76  Resp 14  Post Treatment  Dialyzer Clearance Lightly streaked  Hemodialysis Intake (mL) 0 mL  Liters Processed 72  Fluid Removed (mL) 2400 mL  Tolerated HD Treatment Yes  Post-Hemodialysis Comments Pt goal met.  AVG/AVF Arterial Site Held (minutes) 30 minutes  AVG/AVF Venous Site Held (minutes) 20 minutes  Note  Patient Observations Pt had prolong bleeding Arterial site.

## 2022-10-21 NOTE — Procedures (Signed)
I was present at this dialysis session, have reviewed the session and made  appropriate changes Vinson Moselle MD  [facility] 10/21/2022, 4:48 PM

## 2022-10-21 NOTE — H&P (Signed)
NAME:  Kim Burns, MRN:  [id number], DOB:  [birth year], LOS: 0 ADMISSION DATE:  10/21/2022, CONSULTATION DATE:  10/21/22 REFERRING MD:  Fredderick Phenix - EM , CHIEF COMPLAINT:  fall    History of Present Illness:  87 yo F PMH ESRD on  MWF HD, Afib formerly on AC (dc after spontaneous renal hematoma), HTN, s/p whipple +  (pancreatic mass, no malignancy, high grady dysplasia), RCC s/p embo/ ablation in 2023 c/b urine leak ultimately req L neph rube who was found face down on her kitchen floor by Son in Social worker. Has no memory of falling, sounds like + LOC. At the time that son in law found her she was asleep and woke up easily. Did complain of transient black spots visually.   In talking w family, it sounds like she is at baseline a very poor historian with variable vision, chronic weakness/shuffling gait and frequent trips/falls. They feel she is at baseline mental status in ED at time of our evaluation.   Imaging in ED reveals R frontal hemorrhagic contusion, non-displaced L parietal and occipital fx,  L posterior scalp hematoma  CT C spine non-acute CT L spine with a small nondisplaced S4 fx   ED has reached out to NSGY -- would like SBP < 160    Pertinent  Medical History  ESRD Afib HTN DM CAD  S/p whipple  Exocrine pancreatic insufficiency Ventral incisional hernia  Renal carcinoma s/p ablation (2023)   Significant Hospital Events: Including procedures, antibiotic start and stop dates in addition to other pertinent events   9/2 ED after a fall at home   Interim History / Subjective:  Hemorrhagic contusion   Objective   Blood pressure (!) 162/96, pulse 62, temperature 97.9 F (36.6 C), resp. rate (!) 21, height 5\' 3"  (1.6 m), weight 54.9 kg, SpO2 100%.       No intake or output data in the 24 hours ending 10/21/22 1030 Filed Weights   10/21/22 0745  Weight: 54.9 kg    Examination: General: chronically ill elderly F NAD  HENT: dry mm anicteric sclera  Lungs: CTAb on RA, even  unlabored   Cardiovascular: rrr cap refill < 3 sec  Abdomen: soft ndnt  Extremities: no acute joint deformity. BLE pitting edema   Neuro: Awakens to voice. She has 5/5 strength. Following commands. Oriented x 3. Intermittent dysarthria. Hard of hearing.  GU: defer   Resolved Hospital Problem list     Assessment & Plan:   Fall with + possible LOC TBI: R frontal contrecoup Hemorrhagic cerebral contusion  Non-displaced L parietal and L occipital skull fx Scalp hematoma S4 fracture  P -admit to ICU  -NSGY consulted, await recs  -BP <160, plan as below  -fq neuro checks  -repeat CT H tomorrow morning unless NSGY indicates sooner  -no AC  -if passes swallow, adv diet  HTN / hypotension -gets midodrine w HD, otherwise is on metop -SBP goal is < 160 per NSGY -think HD will help some  -will try PRN hydral and see. Might need clevi but hopeful to avoid w SBP 170 right now -holding home metop and home midodrine while we see what her hemodynamics do w HD   ESRD -MWF HD P -will reach out to nephro re HD  RCC s/p embolization/ ablation c/b urine leak, now s/p L neph tube -apparently is due for neph tube exchange 9/3 P -IR consult -- not sure if this has to be done current admission vs just reschedule  as outpt   S/p whipple (2018)  Exocrine pancreatic insufficiency  -creon BID w meals  IDDM -sSSI ACH  Frequent falls -will need PT/OT -may ultimately need CIR vs SNF etc -- TBD   Best Practice (right click and "Reselect all SmartList Selections" daily)   Diet/type: Regular consistency (see orders) DVT prophylaxis: SCD GI prophylaxis: N/A Lines: N/A Foley:  N/A Code Status:  full code verified at time on admission 9/2 Last date of multidisciplinary goals of care discussion [10/21/22]  Labs   CBC: Recent Labs  Lab 10/21/22 0756  WBC 11.3*  NEUTROABS 9.5*  HGB 12.1  HCT 38.6  MCV 99.2  PLT 175    Basic Metabolic Panel: Recent Labs  Lab 10/21/22 0756  NA 132*   K 3.9  CL 98  CO2 20*  GLUCOSE 153*  BUN 40*  CREATININE 5.37*  CALCIUM 8.3*   GFR: Estimated Creatinine Clearance: 6.3 mL/min (A) (by C-G formula based on SCr of 5.37 mg/dL (H)). Recent Labs  Lab 10/21/22 0756  WBC 11.3*    Liver Function Tests: No results for input(s): "AST", "ALT", "ALKPHOS", "BILITOT", "PROT", "ALBUMIN" in the last 168 hours. No results for input(s): "LIPASE", "AMYLASE" in the last 168 hours. No results for input(s): "AMMONIA" in the last 168 hours.  ABG    Component Value Date/Time   PHART 7.309 (L) 04/23/2016 1356   PCO2ART 38.5 04/23/2016 1356   PO2ART 203.0 (H) 04/23/2016 1356   HCO3 20.8 01/22/2022 1257   TCO2 22 01/22/2022 1257   ACIDBASEDEF 7.0 (H) 01/22/2022 1257   O2SAT 87 01/22/2022 1257     Coagulation Profile: No results for input(s): "INR", "PROTIME" in the last 168 hours.  Cardiac Enzymes: No results for input(s): "CKTOTAL", "CKMB", "CKMBINDEX", "TROPONINI" in the last 168 hours.  HbA1C: Hgb A1c MFr Bld  Date/Time Value Ref Range Status  09/14/2022 05:12 AM 5.8 (H) 4.8 - 5.6 % Final    Comment:    (NOTE)         Prediabetes: 5.7 - 6.4         Diabetes: >6.4         Glycemic control for adults with diabetes: <7.0   01/22/2022 05:06 AM 6.6 (H) 4.8 - 5.6 % Final    Comment:    (NOTE)         Prediabetes: 5.7 - 6.4         Diabetes: >6.4         Glycemic control for adults with diabetes: <7.0     CBG: No results for input(s): "GLUCAP" in the last 168 hours.  Review of Systems:   Review of Systems  Constitutional: Negative.   HENT: Negative.    Eyes:  Positive for double vision.  Respiratory: Negative.    Cardiovascular:  Positive for leg swelling.  Gastrointestinal: Negative.   Genitourinary: Negative.   Musculoskeletal:  Positive for falls.  Skin: Negative.   Neurological:  Positive for loss of consciousness and headaches. Negative for focal weakness.  Endo/Heme/Allergies:  Bruises/bleeds easily.   Psychiatric/Behavioral: Negative.       Past Medical History:  She,  has a past medical history of Anemia of chronic disease, Anxiety, Blood transfusion without reported diagnosis, Bradycardia, Diabetes mellitus without complication (HCC), Diarrhea, Dysrhythmia (04/16/2016), ESRD on hemodialysis (HCC), GERD (gastroesophageal reflux disease), Gout, Headache, History of kidney stones (06/2013), Hyperparathyroidism (HCC), Hypertension, PONV (postoperative nausea and vomiting), Sleep apnea, Stroke (HCC) (04/16/2016), and Vitamin D deficiency.   Surgical History:  Past Surgical History:  Procedure Laterality Date   A/V FISTULAGRAM Left 08/30/2021   Procedure: A/V Fistulagram;  Surgeon: Cephus Shelling, MD;  [facility] INVASIVE CV LAB;  Service: Cardiovascular;  Laterality: Left;   ABDOMINAL HYSTERECTOMY  1985   complete   BACK SURGERY  1980   lower   BASCILIC VEIN TRANSPOSITION Left 04/24/2017   Procedure: LEFT ARM FIRST STAGE BASILIC VEIN TRANSPOSITION;  Surgeon: Nada Libman, MD;  Location: [facility] OR;  Service: Vascular;  Laterality: Left;   BASCILIC VEIN TRANSPOSITION Left 07/10/2017   Procedure: SECOND STAGE BASILIC VEIN TRANSPOSITION LEFT ARM;  Surgeon: Nada Libman, MD;  Location: [facility] OR;  Service: Vascular;  Laterality: Left;   BREAST LUMPECTOMY Left x 2   many years apart, benign   CHOLECYSTECTOMY  1985   CYSTOSCOPY W/ URETERAL STENT PLACEMENT Left 10/31/2021   Procedure: CYSTOSCOPY WITH RETROGRADE PYELOGRAM/URETERAL STENT PLACEMENT;  Surgeon: Crist Fat, MD;  Location: [facility] OR;  Service: Urology;  Laterality: Left;   CYSTOSCOPY WITH URETEROSCOPY AND STENT PLACEMENT Left 06/18/2013   Procedure: CYSTOSCOPY WITH Lef URETEROSCOPY AND Left STENT PLACEMENT;  Surgeon: Crecencio Mc, MD;  Location: [facility] ORS;  Service: Urology;  Laterality: Left;   ESOPHAGOGASTRODUODENOSCOPY (EGD) WITH PROPOFOL N/A 11/13/2016   Procedure: ESOPHAGOGASTRODUODENOSCOPY (EGD) WITH PROPOFOL;  Surgeon:  Kathi Der, MD;  Location: [facility] ENDOSCOPY;  Service: Gastroenterology;  Laterality: N/A;   EUS N/A 02/07/2016   Procedure: ESOPHAGEAL ENDOSCOPIC ULTRASOUND (EUS) RADIAL;  Surgeon: Willis Modena, MD;  Location: [facility] ENDOSCOPY;  Service: Endoscopy;  Laterality: N/A;   EYE SURGERY Bilateral 2014   ioc for catracts    FOOT SURGERY Left 1990   something with toes unsure what    HERNIA REPAIR  2018   IR CATHETER TUBE CHANGE  03/14/2022   IR EMBO TUMOR ORGAN ISCHEMIA INFARCT INC GUIDE ROADMAPPING  08/22/2021   IR NEPHROSTOMY EXCHANGE LEFT  06/27/2022   IR NEPHROSTOMY EXCHANGE LEFT  08/26/2022   IR NEPHROSTOMY PLACEMENT LEFT  03/14/2022   IR RADIOLOGIST EVAL & MGMT  05/18/2021   IR RADIOLOGIST EVAL & MGMT  10/01/2021   IR RADIOLOGIST EVAL & MGMT  11/12/2021   IR RADIOLOGIST EVAL & MGMT  11/22/2021   IR RADIOLOGIST EVAL & MGMT  11/27/2021   IR RADIOLOGIST EVAL & MGMT  12/20/2021   IR RENAL SUPRASEL UNI S&I MOD SED  08/22/2021   IR US GUIDE VASC ACCESS RIGHT  08/22/2021   LEFT HEART CATH AND CORONARY ANGIOGRAPHY N/A 08/27/2021   Procedure: LEFT HEART CATH AND CORONARY ANGIOGRAPHY;  Surgeon: Kathleene Hazel, MD;  Location: [facility] INVASIVE CV LAB;  Service: Cardiovascular;  Laterality: N/A;   PARATHYROID EXPLORATION     PERIPHERAL VASCULAR BALLOON ANGIOPLASTY Left 08/30/2021   Procedure: PERIPHERAL VASCULAR BALLOON ANGIOPLASTY;  Surgeon: Cephus Shelling, MD;  Location: [facility] INVASIVE CV LAB;  Service: Cardiovascular;  Laterality: Left;  arm fistula   RADIOLOGY WITH ANESTHESIA Left 08/22/2021   Procedure: MICROWAVE ABLATION;  Surgeon: Simonne Come, MD;  Location: [facility] ORS;  Service: Anesthesiology;  Laterality: Left;   WHIPPLE PROCEDURE N/A 04/23/2016   Procedure: WHIPPLE PROCEDURE;  Surgeon: Almond Lint, MD;  Location: [facility] OR;  Service: General;  Laterality: N/A;     Social History:   reports that she has never smoked. She has never used smokeless tobacco. She reports that she does not drink alcohol and does  not use drugs.   Family History:  Her family history includes Alzheimer's disease in her sister and  sister; Cancer in her brother and father; Heart disease in her mother; Stroke in her mother. There is no history of Breast cancer.   Allergies Allergies  Allergen Reactions   Lopid [Gemfibrozil] Other (See Comments)    Myalgia    Lotemax [Loteprednol Etabonate] Rash    Skin burning  Blurry vision   Statins Other (See Comments)    Rosuvastatin Muscle weakness   Norco [Hydrocodone-Acetaminophen] Other (See Comments)    Headaches  Tolerates acetaminophen    Other Diarrhea    Real Butter - diarrhea   Vibra-Tab [Doxycycline] Other (See Comments)    Unknown reaction   Amlodipine Other (See Comments)    Norvasc   Codeine Other (See Comments)    headache   Hydrocodone Other (See Comments)   Hydrocodone-Acetaminophen Other (See Comments)    Headache. Tolerates acetaminophen.   Lisinopril Other (See Comments)   Verapamil Other (See Comments)     Home Medications  Prior to Admission medications   Medication Sig Start Date End Date Taking? Authorizing Provider  acetaminophen (TYLENOL) 325 MG tablet Take 2 tablets (650 mg total) by mouth every 6 (six) hours as needed for mild pain, fever or headache. Patient taking differently: Take 500 mg by mouth every 6 (six) hours as needed for mild pain, fever or headache. 01/30/22   Elgergawy, Leana Roe, MD  amiodarone (PACERONE) 200 MG tablet Take 1 tablet (200 mg total) by mouth daily. 10/01/22   Joylene Grapes, NP  B Complex-C-Zn-Folic Acid (DIALYVITE 800 WITH ZINC) 0.8 MG TABS Take 1 tablet by mouth daily.    [provider]  Brinzolamide-Brimonidine 1-0.2 % SUSP Apply 1 drop to eye 2 (two) times daily.    [provider]  Cholecalciferol (VITAMIN D-3 PO) Take 1 capsule by mouth every Monday, Wednesday, and Friday with hemodialysis.    [provider]  colestipol (COLESTID) 1 g tablet Take 1 g by mouth See admin  instructions. 1 tablet (1g) prior to dialysis M-W-F + 1 tablet daily PRN diarrhea    [provider]  diclofenac sodium (VOLTAREN) 1 % GEL Apply 2 g topically 4 (four) times daily as needed (for hand pain).    [provider]  diclofenac Sodium (VOLTAREN) 1 % GEL Apply 2 g topically 3 (three) times daily as needed (pain). 09/24/22   Rodolph Bong, MD  erythromycin ophthalmic ointment Place into the left eye 2 (two) times daily. 09/24/22   Rodolph Bong, MD  folic acid (FOLVITE) 1 MG tablet Take 1 tablet (1 mg total) by mouth daily. 01/31/22   Elgergawy, Leana Roe, MD  insulin aspart (NOVOLOG) 100 UNIT/ML injection Inject 0-6 Units into the skin 3 (three) times daily with meals. Patient taking differently: Inject 0-6 Units into the skin See admin instructions. Only if sugar is >200 for at least an hour 01/30/22   Elgergawy, Leana Roe, MD  lidocaine-prilocaine (EMLA) cream Apply 1 Application topically every Monday, Wednesday, and Friday with hemodialysis.    [provider]  lipase/protease/amylase (CREON) 36000 UNITS CPEP capsule Take 36,000 Units by mouth 2 (two) times daily with a meal.    [provider]  Menthol-Methyl Salicylate (MUSCLE RUB) 10-15 % CREA Apply 1 Application topically as needed for muscle pain. 09/24/22   Rodolph Bong, MD  metoprolol tartrate (LOPRESSOR) 50 MG tablet Take 1 tablet (50 mg total) by mouth 2 (two) times daily. 09/24/22   Rodolph Bong, MD  midodrine (PROAMATINE) 10 MG tablet Take 10 mg  on dialysis days Mon-Wed-Fri 06/13/22   [place], Peter M, MD  olopatadine (PATADAY) 0.1 % ophthalmic solution Place 1 drop into both eyes 2 (two) times daily.    [provider]  prednisoLONE acetate (PRED FORTE) 1 % ophthalmic suspension Place 1 drop into the left eye 2 (two) times daily. 09/24/22   Rodolph Bong, MD  RESTASIS 0.05 % ophthalmic emulsion Place 1 drop into both eyes as needed (yes irratation). 11/06/21   [provider]  rOPINIRole (REQUIP) 0.25 MG tablet Take 1 tablet (0.25 mg total) by mouth 2 (two) times daily. 09/24/22   Rodolph Bong, MD  SENSIPAR 30 MG tablet Take 30 mg by mouth with breakfast, with lunch, and with evening meal. 12/18/12   [provider]  sevelamer carbonate (RENVELA) 800 MG tablet Take 1,600 mg by mouth 3 (three) times daily with meals.    [provider]  valACYclovir (VALTREX) 500 MG tablet Take 1 tablet (500 mg total) by mouth at bedtime. 09/24/22   Rodolph Bong, MD     Critical care time: 39 min      CRITICAL CARE Performed by: Lanier Clam   Total critical care time: 39 minutes  Critical care time was exclusive of separately billable procedures and treating other patients. Critical care was necessary to treat or prevent imminent or life-threatening deterioration.  Critical care was time spent personally by me on the following activities: development of treatment plan with patient and/or surrogate as well as nursing, discussions with consultants, evaluation of patient's response to treatment, examination of patient, obtaining history from patient or surrogate, ordering and performing treatments and interventions, ordering and review of laboratory studies, ordering and review of radiographic studies, pulse oximetry and re-evaluation of patient's condition.  [name] MSN, AGACNP-BC [facility] Pulmonary/Critical Care Medicine Amion for pager  10/21/2022, 12:04 PM

## 2022-10-21 NOTE — ED Notes (Signed)
CCM at bedside 

## 2022-10-21 NOTE — TOC Initial Note (Signed)
Transition of Care [facility]) - Initial/Assessment Note    Patient Details  Name: Kim Burns MRN: [id number] Date of Birth: 1936/02/08  Transition of Care [facility]) CM/SW Contact:    Mearl Latin, LCSW Phone Number: 10/21/2022, 3:06 PM  Clinical Narrative:                 Patient admitted with fall from home on Dialysis OP. Was set up last month with [facility]. TOC will continue to follow for needs.      Barriers to Discharge: Continued Medical Work up   Patient Goals and CMS Choice            Expected Discharge Plan and Services       Living arrangements for the past 2 months: Single Family Home                                      Prior Living Arrangements/Services Living arrangements for the past 2 months: Single Family Home   Patient language and need for interpreter reviewed:: Yes        Need for Family Participation in Patient Care: Yes (Comment) Care giver support system in place?: Yes (comment)   Criminal Activity/Legal Involvement Pertinent to Current Situation/Hospitalization: No - Comment as needed  Activities of Daily Living      Permission Sought/Granted                  Emotional Assessment       Orientation: : Oriented to Self, Oriented to Place, Oriented to  Time, Oriented to Situation Alcohol / Substance Use: Not Applicable Psych Involvement: No (comment)  Admission diagnosis:  Traumatic brain injury (HCC) [S06.9XAA] Closed fracture of skull, unspecified bone, initial encounter (HCC) [S02.91XA] Closed fracture of sacrum, unspecified portion of sacrum, initial encounter (HCC) [S32.10XA] Contusion of right cerebral hemisphere, with unknown loss of consciousness status, initial encounter [facility]) [S06.31AA] Patient Active Problem List   Diagnosis Date Noted   Traumatic brain injury (HCC) 10/21/2022   Contusion of right cerebral hemisphere (HCC) 10/21/2022   Closed skull fracture (HCC) 10/21/2022   Sacral  fracture, closed (HCC) 10/21/2022   Exocrine pancreatic insufficiency 10/21/2022   Hypertension 10/21/2022   Hypomagnesemia 09/18/2022   RLS (restless legs syndrome) 09/18/2022   HSV (herpes simplex virus) dendritic keratitis 09/18/2022   Transaminitis 09/18/2022   Atrial fibrillation with RVR (HCC) 09/14/2022   Essential hypertension 09/14/2022   Erythropoietin (EPO) stimulating agent anemia management patient 07/16/2022   Hematoma of left kidney 01/22/2022   Kidney, perinephric abscess 11/02/2021   Bacteremia due to Streptococcus 11/01/2021   Lactic acidosis 10/30/2021   Fever 10/30/2021   Severe sepsis (HCC) 10/29/2021   Hydronephrosis of left kidney 10/29/2021   Chronic diastolic CHF (congestive heart failure) (HCC) 10/29/2021   Coronary artery disease involving native coronary artery of native heart without angina pectoris 10/29/2021   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) 10/29/2021   Chest pain of uncertain etiology    Pulmonary nodule 08/26/2021   Paroxysmal atrial fibrillation (HCC) 08/26/2021   End-stage renal disease on hemodialysis (HCC) 08/25/2021   Renal cell carcinoma of left kidney (HCC) 08/22/2021   DM2 (diabetes mellitus, type 2) (HCC) 11/14/2016   Hypokalemia 11/12/2016   Melena    History of anemia due to chronic kidney disease    Acute renal failure (ARF) (HCC) 10/03/2016   GERD (gastroesophageal reflux disease) 04/29/2016  Hyperglycemia 04/29/2016   IPMN (intraductal papillary mucinous neoplasm) 04/23/2016   History of stroke 04/16/2016   Sepsis (HCC) 06/18/2013   Localized swelling, mass and lump, neck 03/29/2013   Sinus bradycardia 09/18/2012   Fatigue 09/18/2012   PCP:  Lewis Moccasin, MD Pharmacy:   [facility] [address] [place] - [address] [place] [address] Phone: [phone] Fax: [phone]  [facility]) - [place], [place] - [address] [facility] [address] [place] [address] [place] [address] Phone: [phone] Fax: [phone]  [facility] [address] [place] [address] Phone: [phone] Fax: [phone]     Social Determinants of Health (SDOH) Social History: SDOH Screenings   Food Insecurity: No Food Insecurity (11/01/2021)  Housing: Low Risk  (11/01/2021)  Transportation Needs: No Transportation Needs (11/01/2021)  Utilities: Not At Risk (11/01/2021)  Tobacco Use: Low Risk  (10/21/2022)   SDOH Interventions:     Readmission Risk Interventions    08/27/2021   10:16 AM  Readmission Risk Prevention Plan  Transportation Screening Complete  PCP or Specialist Appt within 3-5 Days Complete  HRI or Home Care Consult Complete  Social Work Consult for Recovery Care Planning/Counseling Complete  Palliative Care Screening Not Applicable  Medication Review Oceanographer) Complete

## 2022-10-21 NOTE — Consult Note (Signed)
Reason for Consult: Right frontal contusion after fall Referring Physician: Emergency department  Kim Burns is an 87 y.o. female.  HPI: 87 year old female with history of renal failure on hemodialysis.  Patient suffered an unwitnessed fall this morning.  Patient notes swelling in her posterior scalp.  Complains of mild headache.  No other issues.  Patient due for dialysis today.  Past Medical History:  Diagnosis Date   Anemia of chronic disease    takes iron   Anxiety    Blood transfusion without reported diagnosis    Bradycardia    Diabetes mellitus without complication (HCC)    became diabetic after Whipple procedure   Diarrhea    Dysrhythmia 04/16/2016   bradycardia due to medication    ESRD on hemodialysis [facility])    M-W-F   GERD (gastroesophageal reflux disease)    Gout    Headache    History of kidney stones 06/2013   Hyperparathyroidism (HCC)    Hypertension    PONV (postoperative nausea and vomiting)    Sleep apnea    no cpap machine. could not tolerate   Stroke (HCC) 04/16/2016   TIA 1995   Vitamin D deficiency     Past Surgical History:  Procedure Laterality Date   A/V FISTULAGRAM Left 08/30/2021   Procedure: A/V Fistulagram;  Surgeon: Cephus Shelling, MD;  Location: [facility] INVASIVE CV LAB;  Service: Cardiovascular;  Laterality: Left;   ABDOMINAL HYSTERECTOMY  1985   complete   BACK SURGERY  1980   lower   BASCILIC VEIN TRANSPOSITION Left 04/24/2017   Procedure: LEFT ARM FIRST STAGE BASILIC VEIN TRANSPOSITION;  Surgeon: Nada Libman, MD;  Location: [facility] OR;  Service: Vascular;  Laterality: Left;   BASCILIC VEIN TRANSPOSITION Left 07/10/2017   Procedure: SECOND STAGE BASILIC VEIN TRANSPOSITION LEFT ARM;  Surgeon: Nada Libman, MD;  Location: [facility] OR;  Service: Vascular;  Laterality: Left;   BREAST LUMPECTOMY Left x 2   many years apart, benign   CHOLECYSTECTOMY  1985   CYSTOSCOPY W/ URETERAL STENT PLACEMENT Left 10/31/2021   Procedure: CYSTOSCOPY  WITH RETROGRADE PYELOGRAM/URETERAL STENT PLACEMENT;  Surgeon: Crist Fat, MD;  Location: [facility] OR;  Service: Urology;  Laterality: Left;   CYSTOSCOPY WITH URETEROSCOPY AND STENT PLACEMENT Left 06/18/2013   Procedure: CYSTOSCOPY WITH Lef URETEROSCOPY AND Left STENT PLACEMENT;  Surgeon: Crecencio Mc, MD;  Location: [facility] ORS;  Service: Urology;  Laterality: Left;   ESOPHAGOGASTRODUODENOSCOPY (EGD) WITH PROPOFOL N/A 11/13/2016   Procedure: ESOPHAGOGASTRODUODENOSCOPY (EGD) WITH PROPOFOL;  Surgeon: Kathi Der, MD;  Location: [facility] ENDOSCOPY;  Service: Gastroenterology;  Laterality: N/A;   EUS N/A 02/07/2016   Procedure: ESOPHAGEAL ENDOSCOPIC ULTRASOUND (EUS) RADIAL;  Surgeon: Willis Modena, MD;  Location: [facility] ENDOSCOPY;  Service: Endoscopy;  Laterality: N/A;   EYE SURGERY Bilateral 2014   ioc for catracts    FOOT SURGERY Left 1990   something with toes unsure what    HERNIA REPAIR  2018   IR CATHETER TUBE CHANGE  03/14/2022   IR EMBO TUMOR ORGAN ISCHEMIA INFARCT INC GUIDE ROADMAPPING  08/22/2021   IR NEPHROSTOMY EXCHANGE LEFT  06/27/2022   IR NEPHROSTOMY EXCHANGE LEFT  08/26/2022   IR NEPHROSTOMY PLACEMENT LEFT  03/14/2022   IR RADIOLOGIST EVAL & MGMT  05/18/2021   IR RADIOLOGIST EVAL & MGMT  10/01/2021   IR RADIOLOGIST EVAL & MGMT  11/12/2021   IR RADIOLOGIST EVAL & MGMT  11/22/2021   IR RADIOLOGIST EVAL & MGMT  11/27/2021   IR RADIOLOGIST  EVAL & MGMT  12/20/2021   IR RENAL SUPRASEL UNI S&I MOD SED  08/22/2021   IR US GUIDE VASC ACCESS RIGHT  08/22/2021   LEFT HEART CATH AND CORONARY ANGIOGRAPHY N/A 08/27/2021   Procedure: LEFT HEART CATH AND CORONARY ANGIOGRAPHY;  Surgeon: Kathleene Hazel, MD;  Location: [facility] INVASIVE CV LAB;  Service: Cardiovascular;  Laterality: N/A;   PARATHYROID EXPLORATION     PERIPHERAL VASCULAR BALLOON ANGIOPLASTY Left 08/30/2021   Procedure: PERIPHERAL VASCULAR BALLOON ANGIOPLASTY;  Surgeon: Cephus Shelling, MD;  Location: [facility] INVASIVE CV LAB;  Service: Cardiovascular;   Laterality: Left;  arm fistula   RADIOLOGY WITH ANESTHESIA Left 08/22/2021   Procedure: MICROWAVE ABLATION;  Surgeon: Simonne Come, MD;  Location: [facility] ORS;  Service: Anesthesiology;  Laterality: Left;   WHIPPLE PROCEDURE N/A 04/23/2016   Procedure: WHIPPLE PROCEDURE;  Surgeon: Almond Lint, MD;  Location: [facility] OR;  Service: General;  Laterality: N/A;    Family History  Problem Relation Age of Onset   Heart disease Mother    Stroke Mother    Cancer Father        colon   Alzheimer's disease Sister    Alzheimer's disease Sister    Cancer Brother        brain   Breast cancer Neg Hx     Social History:  reports that she has never smoked. She has never used smokeless tobacco. She reports that she does not drink alcohol and does not use drugs.  Allergies:  Allergies  Allergen Reactions   Lopid [Gemfibrozil] Other (See Comments)    Myalgia    Lotemax [Loteprednol Etabonate] Rash    Skin burning  Blurry vision   Statins Other (See Comments)    Rosuvastatin Muscle weakness   Norco [Hydrocodone-Acetaminophen] Other (See Comments)    Headaches  Tolerates acetaminophen    Other Diarrhea    Real Butter - diarrhea   Vibra-Tab [Doxycycline] Other (See Comments)    Unknown reaction   Amlodipine Other (See Comments)    Norvasc   Codeine Other (See Comments)    headache   Hydrocodone Other (See Comments)   Hydrocodone-Acetaminophen Other (See Comments)    Headache. Tolerates acetaminophen.   Lisinopril Other (See Comments)   Verapamil Other (See Comments)    Medications: I have reviewed the patient's current medications.  Results for orders placed or performed during the hospital encounter of 10/21/22 (from the past 48 hour(s))  CBC with Differential     Status: Abnormal   Collection Time: 10/21/22  7:56 AM  Result Value Ref Range   WBC 11.3 (H) 4.0 - 10.5 K/uL   RBC 3.89 3.87 - 5.11 MIL/uL   Hemoglobin 12.1 12.0 - 15.0 g/dL   HCT 40.9 81.1 - 91.4 %   MCV 99.2 80.0 - 100.0 fL    MCH 31.1 26.0 - 34.0 pg   MCHC 31.3 30.0 - 36.0 g/dL   RDW 78.2 (H) 95.6 - 21.3 %   Platelets 175 150 - 400 K/uL   nRBC 0.0 0.0 - 0.2 %   Neutrophils Relative % 83 %   Neutro Abs 9.5 (H) 1.7 - 7.7 K/uL   Lymphocytes Relative 9 %   Lymphs Abs 1.0 0.7 - 4.0 K/uL   Monocytes Relative 6 %   Monocytes Absolute 0.7 0.1 - 1.0 K/uL   Eosinophils Relative 1 %   Eosinophils Absolute 0.1 0.0 - 0.5 K/uL   Basophils Relative 0 %   Basophils Absolute 0.0 0.0 - 0.1 K/uL  Immature Granulocytes 1 %   Abs Immature Granulocytes 0.07 0.00 - 0.07 K/uL    Comment: Performed at [facility], [address], [place], [place] [address]  Basic metabolic panel     Status: Abnormal   Collection Time: 10/21/22  7:56 AM  Result Value Ref Range   Sodium 132 (L) 135 - 145 mmol/L   Potassium 3.9 3.5 - 5.1 mmol/L   Chloride 98 98 - 111 mmol/L   CO2 20 (L) 22 - 32 mmol/L   Glucose, Bld 153 (H) 70 - 99 mg/dL    Comment: Glucose reference range applies only to samples taken after fasting for at least 8 hours.   BUN 40 (H) 8 - 23 mg/dL   Creatinine, Ser 2.95 (H) 0.44 - 1.00 mg/dL   Calcium 8.3 (L) 8.9 - 10.3 mg/dL   GFR, Estimated 7 (L) >60 mL/min    Comment: (NOTE) Calculated using the CKD-EPI Creatinine Equation (2021)    Anion gap 14 5 - 15    Comment: Performed at [facility], [address] [place], [place] [address]  Troponin I (High Sensitivity)     Status: None   Collection Time: 10/21/22  7:56 AM  Result Value Ref Range   Troponin I (High Sensitivity) 16 <18 ng/L    Comment: (NOTE) Elevated high sensitivity troponin I (hsTnI) values and significant  changes across serial measurements may suggest ACS but many other  chronic and acute conditions are known to elevate hsTnI results.  Refer to the "Links" section for chest pain algorithms and additional  guidance. Performed at [facility], [address] [place], [place] [address]     CT Head Wo Contrast  Addendum Date:  10/21/2022   ADDENDUM REPORT: 10/21/2022 10:13 ADDENDUM: Study discussed by telephone with Dr. Shawna Orleans BELFI on 10/21/2022 at 0956 hours. Electronically Signed   By: Odessa Fleming M.D.   On: 10/21/2022 10:13   Result Date: 10/21/2022 CLINICAL DATA:  87 year old female dialysis patient with syncope, fall. Found down. EXAM: CT HEAD WITHOUT CONTRAST TECHNIQUE: Contiguous axial images were obtained from the base of the skull through the vertex without intravenous contrast. RADIATION DOSE REDUCTION: This exam was performed according to the departmental dose-optimization program which includes automated exposure control, adjustment of the mA and/or kV according to patient size and/or use of iterative reconstruction technique. COMPARISON:  Head CT 01/31/2022.  More recent brain MRI 09/14/2022. FINDINGS: Brain: Right anterior frontal lobe, inferior frontal gyrus hemorrhagic contusion is rounded, up to 2 cm diameter. Mild adjacent edema. No significant regional mass effect. No convincing subarachnoid or subdural blood. No IVH. No ventriculomegaly. No midline shift. Basilar cisterns remain normal. Outside the area of acute parenchymal contusion the gray-white matter differentiation is stable. Vascular: Calcified atherosclerosis at the skull base. No suspicious intracranial vascular hyperdensity. Skull: Nondisplaced left parietal and occipital bone fracture. Sinuses/Orbits: Visualized paranasal sinuses and mastoids are stable and well aerated. Other: Leftward gaze. No acute orbit or scalp *CRASH* that broad-based left posterior scalp hematoma measures up to 12 mm thickness at the occiput put and there is an underlying nondisplaced left occipital bone fracture tracking toward but not directly involving the foramen magnum (series 4, image 20). Hematoma and the fracture tracks cephalad before dissipating. Traumatic Brain Injury Risk Stratification Skull Fracture: Nondisplaced - Moderate/mBIG 2 Subdural Hematoma (SDH): No - Low  Subarachnoid Hemorrhage [facility]): No Epidural Hematoma (EDH): No - Low / mBIG 1 Cerebral contusion, intra-axial, intraparenchymal Hemorrhage (IPH): 8mm  plus or multiple - High/mBIG 3 Intraventricular Hemorrhage (IVH): No - Low / mBIG 1 Midline Shift > 1mm or Edema/effacement of sulci/vents: No - Low / mBIG 1 ---------------------------------------------------- IMPRESSION: 1. Positive for acute coup contrecoup brain injury with nondisplaced left posterior skull fracture, right anterior frontal lobe hemorrhagic contusion. No midline shift or other complicating features. 2. Associated left posterior scalp hematoma. Electronically Signed: By: Odessa Fleming M.D. On: 10/21/2022 09:53   CT Lumbar Spine Wo Contrast  Result Date: 10/21/2022 CLINICAL DATA:  Back trauma, no prior imaging (Age >= 16y).  Pain EXAM: CT LUMBAR SPINE WITHOUT CONTRAST TECHNIQUE: Multidetector CT imaging of the lumbar spine was performed without intravenous contrast administration. Multiplanar CT image reconstructions were also generated. RADIATION DOSE REDUCTION: This exam was performed according to the departmental dose-optimization program which includes automated exposure control, adjustment of the mA and/or kV according to patient size and/or use of iterative reconstruction technique. COMPARISON:  06/20/2022 FINDINGS: Segmentation: 5 lumbar type vertebrae. Alignment: No traumatic listhesis. Vertebrae: Diffuse osseous demineralization. Prior laminectomy on the right at L5. There is a subtle nondisplaced fracture of the anterior cortex of the sacrum at S4 (series 8, image 34). Lumbar vertebral body heights are maintained without evidence of fracture. No suspicious bone lesion. Paraspinal and other soft tissues: Aortic atherosclerosis. There is some mild wall thickening of the imaged distal sigmoid colon. Disc levels: Moderate-advanced multilevel degenerative disc disease of the lumbar spine. IMPRESSION: 1. Subtle nondisplaced fracture of the anterior  cortex of the sacrum at S4. 2. No acute fracture or traumatic listhesis of the lumbar spine. 3. Mild wall thickening of the imaged distal sigmoid colon. Correlate for signs and symptoms of colitis. 4. Aortic atherosclerosis (ICD10-I70.0). Electronically Signed   By: Duanne Guess D.O.   On: 10/21/2022 09:50   CT Cervical Spine Wo Contrast  Result Date: 10/21/2022 CLINICAL DATA:  Neck trauma.  Head trauma. EXAM: CT CERVICAL SPINE WITHOUT CONTRAST TECHNIQUE: Multidetector CT imaging of the cervical spine was performed without intravenous contrast. Multiplanar CT image reconstructions were also generated. RADIATION DOSE REDUCTION: This exam was performed according to the departmental dose-optimization program which includes automated exposure control, adjustment of the mA and/or kV according to patient size and/or use of iterative reconstruction technique. COMPARISON:  CT cervical spine 03/02/2018. FINDINGS: Alignment: Physiologic. Skull base and vertebrae: No evidence of acute cervical spine fracture or traumatic subluxation. Soft tissues and spinal canal: No prevertebral fluid or swelling. No visible canal hematoma. Disc levels: Mildly progressive multilevel cervical spondylosis, especially at C3-4 where there is progressive loss of disc height and endplate osteophyte formation. Spondylosis remains greatest at C5-6 and C6-7, not grossly changed from previous study. No large disc herniation identified. Upper chest: Clear lung apices. Other: Severe bilateral carotid atherosclerosis. IMPRESSION: 1. No evidence of acute cervical spine fracture, traumatic subluxation or static signs of instability. 2. Mildly progressive multilevel cervical spondylosis. 3. Severe bilateral carotid atherosclerosis. Electronically Signed   By: Carey Bullocks M.D.   On: 10/21/2022 09:48   DG Chest Portable 1 View  Result Date: 10/21/2022 CLINICAL DATA:  87 year old female with history of chest pain. EXAM: PORTABLE CHEST 1 VIEW  COMPARISON:  Chest x-ray 09/14/2022. FINDINGS: Lung volumes are normal. No consolidative airspace disease. No pleural effusions. No pneumothorax. No pulmonary nodule or mass noted. Pulmonary vasculature and the cardiomediastinal silhouette are within normal limits. Atherosclerosis in the thoracic aorta. IMPRESSION: 1. No radiographic evidence of acute cardiopulmonary disease. 2. Aortic atherosclerosis. Electronically Signed   By: Reuel Boom  Entrikin M.D.   On: 10/21/2022 08:45    Pertinent items noted in HPI and remainder of comprehensive ROS otherwise negative. Blood pressure (!) 169/98, pulse 63, temperature 97.7 F (36.5 C), temperature source Oral, resp. rate 15, height 5\' 3"  (1.6 m), weight 54.9 kg, SpO2 100%. Patient is mildly somnolent but awakens easily.  Oriented x 4.  Speech fluent.  Judgment and insight appear intact.  Cranial nerve function normal bilateral.  Motor and sensory function of the extremities normal.  Examination head ears nose and throat demonstrates some posterior subgaleal swelling but no evidence of bony abnormality.  Oropharynx, nasopharynx and external auditory canals clear.  Chest and abdomen otherwise benign.  Extremities with some mild peripheral edema but no evidence of injury.  Assessment/Plan: Status post fall with resultant right contrecoup frontal contusion.  Recommend ICU observation.  Follow-up head CT scan in morning.  Aspirin.  Okay for hemodialysis without anticoagulation.  Okay to feed.  Kathaleen Maser Kim Burns 10/21/2022, 11:43 AM

## 2022-10-21 NOTE — ED Notes (Signed)
Blood pressure management deferred until Neurosurgery responds per MD.

## 2022-10-21 NOTE — ED Triage Notes (Addendum)
Patient presents to ED family states she was getting ready for dialysis of which she goes M-W-F, mad herself a sandwich and ate it after she started seeing black spots and was found prone on the floor. Son -in - law est her being in the floor for aprox. 30 mins. Upon waking her he states she was alert and oriented  baseline normal. Left arm restricted

## 2022-10-21 NOTE — Consult Note (Addendum)
[facility] Renal Consultation Note    Indication for Consultation:  Hypertension, Volume, ESRD management. NWG:NFAOZ, Christell Constant, MD   HPI: GEENA NORRICK is a 87 y.o. female with a history of ESRD who presented to the ED on 10/21/22 after sustaining a fall in her kitchen prior to her scheduled Monday dialysis treatment. At the time she was alert and oriented but endorsed pain to her head and visual disturbances which resolved prior to her presentation. We were consulted due to concerns for hypertension as well as her being an established ESRD patient undergoing outpatient IHD. Her family at bedside provided pertinent history, including the presence of LE swelling, 'weeping', and recent placement of nephrostomy s/p embolization/ablation for RCC. They denied changes in her mentation from baseline. At the time of interview patient was unarousable.   Past Medical History:  Diagnosis Date   Anemia of chronic disease    takes iron   Anxiety    Blood transfusion without reported diagnosis    Bradycardia    Diabetes mellitus without complication (HCC)    became diabetic after Whipple procedure   Diarrhea    Dysrhythmia 04/16/2016   bradycardia due to medication    ESRD on hemodialysis [facility])    M-W-F   GERD (gastroesophageal reflux disease)    Gout    Headache    History of kidney stones 06/2013   Hyperparathyroidism (HCC)    Hypertension    PONV (postoperative nausea and vomiting)    Sleep apnea    no cpap machine. could not tolerate   Stroke (HCC) 04/16/2016   TIA 1995   Vitamin D deficiency    Past Surgical History:  Procedure Laterality Date   A/V FISTULAGRAM Left 08/30/2021   Procedure: A/V Fistulagram;  Surgeon: Cephus Shelling, MD;  Location: [facility] INVASIVE CV LAB;  Service: Cardiovascular;  Laterality: Left;   ABDOMINAL HYSTERECTOMY  1985   complete   BACK SURGERY  1980   lower   BASCILIC VEIN TRANSPOSITION Left 04/24/2017   Procedure: LEFT ARM FIRST  STAGE BASILIC VEIN TRANSPOSITION;  Surgeon: Nada Libman, MD;  Location: [facility] OR;  Service: Vascular;  Laterality: Left;   BASCILIC VEIN TRANSPOSITION Left 07/10/2017   Procedure: SECOND STAGE BASILIC VEIN TRANSPOSITION LEFT ARM;  Surgeon: Nada Libman, MD;  Location: [facility] OR;  Service: Vascular;  Laterality: Left;   BREAST LUMPECTOMY Left x 2   many years apart, benign   CHOLECYSTECTOMY  1985   CYSTOSCOPY W/ URETERAL STENT PLACEMENT Left 10/31/2021   Procedure: CYSTOSCOPY WITH RETROGRADE PYELOGRAM/URETERAL STENT PLACEMENT;  Surgeon: Crist Fat, MD;  Location: [facility] OR;  Service: Urology;  Laterality: Left;   CYSTOSCOPY WITH URETEROSCOPY AND STENT PLACEMENT Left 06/18/2013   Procedure: CYSTOSCOPY WITH Lef URETEROSCOPY AND Left STENT PLACEMENT;  Surgeon: Crecencio Mc, MD;  Location: [facility] ORS;  Service: Urology;  Laterality: Left;   ESOPHAGOGASTRODUODENOSCOPY (EGD) WITH PROPOFOL N/A 11/13/2016   Procedure: ESOPHAGOGASTRODUODENOSCOPY (EGD) WITH PROPOFOL;  Surgeon: Kathi Der, MD;  Location: [facility] ENDOSCOPY;  Service: Gastroenterology;  Laterality: N/A;   EUS N/A 02/07/2016   Procedure: ESOPHAGEAL ENDOSCOPIC ULTRASOUND (EUS) RADIAL;  Surgeon: Willis Modena, MD;  Location: [facility] ENDOSCOPY;  Service: Endoscopy;  Laterality: N/A;   EYE SURGERY Bilateral 2014   ioc for catracts    FOOT SURGERY Left 1990   something with toes unsure what    HERNIA REPAIR  2018   IR CATHETER TUBE CHANGE  03/14/2022   IR EMBO TUMOR ORGAN ISCHEMIA INFARCT INC  GUIDE ROADMAPPING  08/22/2021   IR NEPHROSTOMY EXCHANGE LEFT  06/27/2022   IR NEPHROSTOMY EXCHANGE LEFT  08/26/2022   IR NEPHROSTOMY PLACEMENT LEFT  03/14/2022   IR RADIOLOGIST EVAL & MGMT  05/18/2021   IR RADIOLOGIST EVAL & MGMT  10/01/2021   IR RADIOLOGIST EVAL & MGMT  11/12/2021   IR RADIOLOGIST EVAL & MGMT  11/22/2021   IR RADIOLOGIST EVAL & MGMT  11/27/2021   IR RADIOLOGIST EVAL & MGMT  12/20/2021   IR RENAL SUPRASEL UNI S&I MOD SED  08/22/2021   IR US GUIDE  VASC ACCESS RIGHT  08/22/2021   LEFT HEART CATH AND CORONARY ANGIOGRAPHY N/A 08/27/2021   Procedure: LEFT HEART CATH AND CORONARY ANGIOGRAPHY;  Surgeon: Kathleene Hazel, MD;  Location: [facility] INVASIVE CV LAB;  Service: Cardiovascular;  Laterality: N/A;   PARATHYROID EXPLORATION     PERIPHERAL VASCULAR BALLOON ANGIOPLASTY Left 08/30/2021   Procedure: PERIPHERAL VASCULAR BALLOON ANGIOPLASTY;  Surgeon: Cephus Shelling, MD;  Location: [facility] INVASIVE CV LAB;  Service: Cardiovascular;  Laterality: Left;  arm fistula   RADIOLOGY WITH ANESTHESIA Left 08/22/2021   Procedure: MICROWAVE ABLATION;  Surgeon: Simonne Come, MD;  Location: [facility] ORS;  Service: Anesthesiology;  Laterality: Left;   WHIPPLE PROCEDURE N/A 04/23/2016   Procedure: WHIPPLE PROCEDURE;  Surgeon: Almond Lint, MD;  Location: [facility] OR;  Service: General;  Laterality: N/A;   Family History  Problem Relation Age of Onset   Heart disease Mother    Stroke Mother    Cancer Father        colon   Alzheimer's disease Sister    Alzheimer's disease Sister    Cancer Brother        brain   Breast cancer Neg Hx    Social History:  reports that she has never smoked. She has never used smokeless tobacco. She reports that she does not drink alcohol and does not use drugs.  ROS: As per HPI otherwise negative.   Physical Exam: Vitals:   10/21/22 1130 10/21/22 1141 10/21/22 1145 10/21/22 1400  BP: (!) 169/98  (!) 157/75 (!) 140/89  Pulse: 63  63 (!) 59  Resp: 15  (!) 23 20  Temp:  97.7 F (36.5 C)    TempSrc:  Oral    SpO2: 100%  100% 98%  Weight:      Height:         General: Ill-appearing elderly female lying asleep on bed.  Head: Normocephalic, contusion present on occiput, sclera non-icteric, mucus membranes are moist. Neck: Supple without lymphadenopathy/masses. JVD not elevated. Lungs: Good respiratory effort on room air. Breath sounds difficult to appreciate due to inadequate expansion.  Heart: RRR with normal S1, S2. No murmurs,  rubs, or gallops appreciated. Abdomen: Soft, non-tender, non-distended with normoactive bowel sounds. No rebound/guarding. No obvious abdominal masses. Nephrostomy tube present in Left Flank region.  Musculoskeletal:  Strength and tone appear normal for age. Lower extremities: 1-2+ BLE edema to the mid-shin. No open wounds or sores. SCDs present.  Neuro: Difficult to arouse, minimal vocal and motor response to prompts or directions.  Psych: see neuro.  Dialysis Access: LUAAVF patent with good bruit and thrill. No edema, erythema, or warmth to palpation.   Allergies  Allergen Reactions   Lopid [Gemfibrozil] Other (See Comments)    Myalgia    Lotemax [Loteprednol Etabonate] Rash    Skin burning  Blurry vision   Statins Other (See Comments)    Rosuvastatin Muscle weakness   Norco [Hydrocodone-Acetaminophen] Other (  See Comments)    Headaches  Tolerates acetaminophen    Other Diarrhea    Real Butter - diarrhea   Vibra-Tab [Doxycycline] Other (See Comments)    Unknown reaction   Amlodipine Other (See Comments)    Norvasc   Codeine Other (See Comments)    headache   Hydrocodone Other (See Comments)   Hydrocodone-Acetaminophen Other (See Comments)    Headache. Tolerates acetaminophen.   Lisinopril Other (See Comments)   Verapamil Other (See Comments)   Prior to Admission medications   Medication Sig Start Date End Date Taking? Authorizing Provider  acetaminophen (TYLENOL) 325 MG tablet Take 2 tablets (650 mg total) by mouth every 6 (six) hours as needed for mild pain, fever or headache. Patient taking differently: Take 500 mg by mouth every 6 (six) hours as needed for mild pain, fever or headache. 01/30/22   Elgergawy, Leana Roe, MD  amiodarone (PACERONE) 200 MG tablet Take 1 tablet (200 mg total) by mouth daily. 10/01/22   Joylene Grapes, NP  B Complex-C-Zn-Folic Acid (DIALYVITE 800 WITH ZINC) 0.8 MG TABS Take 1 tablet by mouth daily.    [provider]   Brinzolamide-Brimonidine 1-0.2 % SUSP Apply 1 drop to eye 2 (two) times daily.    [provider]  Cholecalciferol (VITAMIN D-3 PO) Take 1 capsule by mouth every Monday, Wednesday, and Friday with hemodialysis.    [provider]  colestipol (COLESTID) 1 g tablet Take 1 g by mouth See admin instructions. 1 tablet (1g) prior to dialysis M-W-F + 1 tablet daily PRN diarrhea    [provider]  diclofenac sodium (VOLTAREN) 1 % GEL Apply 2 g topically 4 (four) times daily as needed (for hand pain).    [provider]  diclofenac Sodium (VOLTAREN) 1 % GEL Apply 2 g topically 3 (three) times daily as needed (pain). 09/24/22   Rodolph Bong, MD  erythromycin ophthalmic ointment Place into the left eye 2 (two) times daily. 09/24/22   Rodolph Bong, MD  folic acid (FOLVITE) 1 MG tablet Take 1 tablet (1 mg total) by mouth daily. 01/31/22   Elgergawy, Leana Roe, MD  insulin aspart (NOVOLOG) 100 UNIT/ML injection Inject 0-6 Units into the skin 3 (three) times daily with meals. Patient taking differently: Inject 0-6 Units into the skin See admin instructions. Only if sugar is >200 for at least an hour 01/30/22   Elgergawy, Leana Roe, MD  lidocaine-prilocaine (EMLA) cream Apply 1 Application topically every Monday, Wednesday, and Friday with hemodialysis.    [provider]  lipase/protease/amylase (CREON) 36000 UNITS CPEP capsule Take 36,000 Units by mouth 2 (two) times daily with a meal.    [provider]  Menthol-Methyl Salicylate (MUSCLE RUB) 10-15 % CREA Apply 1 Application topically as needed for muscle pain. 09/24/22   Rodolph Bong, MD  metoprolol tartrate (LOPRESSOR) 50 MG tablet Take 1 tablet (50 mg total) by mouth 2 (two) times daily. 09/24/22   Rodolph Bong, MD  midodrine (PROAMATINE) 10 MG tablet Take 10 mg on dialysis days Mon-Wed-Fri 06/13/22   [place], Peter M, MD  olopatadine (PATADAY) 0.1 % ophthalmic solution Place 1 drop into both  eyes 2 (two) times daily.    [provider]  prednisoLONE acetate (PRED FORTE) 1 % ophthalmic suspension Place 1 drop into the left eye 2 (two) times daily. 09/24/22   Rodolph Bong, MD  RESTASIS 0.05 % ophthalmic emulsion Place 1 drop into both eyes as needed (yes irratation).  11/06/21   [provider]  rOPINIRole (REQUIP) 0.25 MG tablet Take 1 tablet (0.25 mg total) by mouth 2 (two) times daily. 09/24/22   Rodolph Bong, MD  SENSIPAR 30 MG tablet Take 30 mg by mouth with breakfast, with lunch, and with evening meal. 12/18/12   [provider]  sevelamer carbonate (RENVELA) 800 MG tablet Take 1,600 mg by mouth 3 (three) times daily with meals.    [provider]  valACYclovir (VALTREX) 500 MG tablet Take 1 tablet (500 mg total) by mouth at bedtime. 09/24/22   Rodolph Bong, MD   Current Facility-Administered Medications  Medication Dose Route Frequency Provider Last Rate Last Admin   acetaminophen (TYLENOL) tablet 650 mg  650 mg Oral Q6H PRN Lanier Clam, NP   650 mg at 10/21/22 1230   [START ON 10/22/2022] amiodarone (PACERONE) tablet 200 mg  200 mg Oral Daily Bowser, Kaylyn Layer, NP       Chlorhexidine Gluconate Cloth 2 % PADS 6 each  6 each Topical Daily Leslye Peer, MD   6 each at 10/21/22 1230   [START ON 10/22/2022] Chlorhexidine Gluconate Cloth 2 % PADS 6 each  6 each Topical Q0600 Delano Metz, MD       cycloSPORINE (RESTASIS) 0.05 % ophthalmic emulsion 1 drop  1 drop Both Eyes Daily PRN Bowser, Kaylyn Layer, NP       docusate sodium (COLACE) capsule 100 mg  100 mg Oral BID PRN Lanier Clam, NP       hydrALAZINE (APRESOLINE) injection 10 mg  10 mg Intravenous Q4H PRN Lanier Clam, NP   10 mg at 10/21/22 1138   insulin aspart (novoLOG) injection 0-6 Units  0-6 Units Subcutaneous TID WC Bowser, Kaylyn Layer, NP       lipase/protease/amylase (CREON) capsule 36,000 Units  36,000 Units Oral BID WC Bowser, Kaylyn Layer, NP       olopatadine (PATANOL) 0.1 %  ophthalmic solution 1 drop  1 drop Both Eyes BID Bowser, Kaylyn Layer, NP       ondansetron (ZOFRAN) injection 4 mg  4 mg Intravenous Q6H PRN Bowser, Kaylyn Layer, NP       Oral care mouth rinse  15 mL Mouth Rinse PRN Leslye Peer, MD       polyethylene glycol (MIRALAX / GLYCOLAX) packet 17 g  17 g Oral Daily PRN Bowser, Kaylyn Layer, NP       prednisoLONE acetate (PRED FORTE) 1 % ophthalmic suspension 1 drop  1 drop Both Eyes BID Bowser, Kaylyn Layer, NP       traMADol (ULTRAM) tablet 50 mg  50 mg Oral Q12H PRN Bowser, Kaylyn Layer, NP       valACYclovir (VALTREX) tablet 500 mg  500 mg Oral QHS Lanier Clam, NP       Labs: Basic Metabolic Panel: Recent Labs  Lab 10/21/22 0756  NA 132*  K 3.9  CL 98  CO2 20*  GLUCOSE 153*  BUN 40*  CREATININE 5.37*  CALCIUM 8.3*   CBC: Recent Labs  Lab 10/21/22 0756  WBC 11.3*  NEUTROABS 9.5*  HGB 12.1  HCT 38.6  MCV 99.2  PLT 175   CBG: Recent Labs  Lab 10/21/22 1247 10/21/22 1453  GLUCAP 96 131*   Studies/Results: CT Head Wo Contrast  Addendum Date: 10/21/2022   ADDENDUM REPORT: 10/21/2022 10:13 ADDENDUM: Study discussed by telephone with Dr. Shawna Orleans BELFI on 10/21/2022 at 0956 hours. Electronically Signed   By:  Odessa Fleming M.D.   On: 10/21/2022 10:13   Result Date: 10/21/2022 CLINICAL DATA:  87 year old female dialysis patient with syncope, fall. Found down. EXAM: CT HEAD WITHOUT CONTRAST TECHNIQUE: Contiguous axial images were obtained from the base of the skull through the vertex without intravenous contrast. RADIATION DOSE REDUCTION: This exam was performed according to the departmental dose-optimization program which includes automated exposure control, adjustment of the mA and/or kV according to patient size and/or use of iterative reconstruction technique. COMPARISON:  Head CT 01/31/2022.  More recent brain MRI 09/14/2022. FINDINGS: Brain: Right anterior frontal lobe, inferior frontal gyrus hemorrhagic contusion is rounded, up to 2 cm diameter. Mild  adjacent edema. No significant regional mass effect. No convincing subarachnoid or subdural blood. No IVH. No ventriculomegaly. No midline shift. Basilar cisterns remain normal. Outside the area of acute parenchymal contusion the gray-white matter differentiation is stable. Vascular: Calcified atherosclerosis at the skull base. No suspicious intracranial vascular hyperdensity. Skull: Nondisplaced left parietal and occipital bone fracture. Sinuses/Orbits: Visualized paranasal sinuses and mastoids are stable and well aerated. Other: Leftward gaze. No acute orbit or scalp *CRASH* that broad-based left posterior scalp hematoma measures up to 12 mm thickness at the occiput put and there is an underlying nondisplaced left occipital bone fracture tracking toward but not directly involving the foramen magnum (series 4, image 20). Hematoma and the fracture tracks cephalad before dissipating. Traumatic Brain Injury Risk Stratification Skull Fracture: Nondisplaced - Moderate/mBIG 2 Subdural Hematoma (SDH): No - Low Subarachnoid Hemorrhage [facility]): No Epidural Hematoma (EDH): No - Low / mBIG 1 Cerebral contusion, intra-axial, intraparenchymal Hemorrhage (IPH): 8mm plus or multiple - High/mBIG 3 Intraventricular Hemorrhage (IVH): No - Low / mBIG 1 Midline Shift > 1mm or Edema/effacement of sulci/vents: No - Low / mBIG 1 ---------------------------------------------------- IMPRESSION: 1. Positive for acute coup contrecoup brain injury with nondisplaced left posterior skull fracture, right anterior frontal lobe hemorrhagic contusion. No midline shift or other complicating features. 2. Associated left posterior scalp hematoma. Electronically Signed: By: Odessa Fleming M.D. On: 10/21/2022 09:53   CT Lumbar Spine Wo Contrast  Result Date: 10/21/2022 CLINICAL DATA:  Back trauma, no prior imaging (Age >= 16y).  Pain EXAM: CT LUMBAR SPINE WITHOUT CONTRAST TECHNIQUE: Multidetector CT imaging of the lumbar spine was performed without  intravenous contrast administration. Multiplanar CT image reconstructions were also generated. RADIATION DOSE REDUCTION: This exam was performed according to the departmental dose-optimization program which includes automated exposure control, adjustment of the mA and/or kV according to patient size and/or use of iterative reconstruction technique. COMPARISON:  06/20/2022 FINDINGS: Segmentation: 5 lumbar type vertebrae. Alignment: No traumatic listhesis. Vertebrae: Diffuse osseous demineralization. Prior laminectomy on the right at L5. There is a subtle nondisplaced fracture of the anterior cortex of the sacrum at S4 (series 8, image 34). Lumbar vertebral body heights are maintained without evidence of fracture. No suspicious bone lesion. Paraspinal and other soft tissues: Aortic atherosclerosis. There is some mild wall thickening of the imaged distal sigmoid colon. Disc levels: Moderate-advanced multilevel degenerative disc disease of the lumbar spine. IMPRESSION: 1. Subtle nondisplaced fracture of the anterior cortex of the sacrum at S4. 2. No acute fracture or traumatic listhesis of the lumbar spine. 3. Mild wall thickening of the imaged distal sigmoid colon. Correlate for signs and symptoms of colitis. 4. Aortic atherosclerosis (ICD10-I70.0). Electronically Signed   By: Duanne Guess D.O.   On: 10/21/2022 09:50   CT Cervical Spine Wo Contrast  Result Date: 10/21/2022 CLINICAL DATA:  Neck trauma.  Head  trauma. EXAM: CT CERVICAL SPINE WITHOUT CONTRAST TECHNIQUE: Multidetector CT imaging of the cervical spine was performed without intravenous contrast. Multiplanar CT image reconstructions were also generated. RADIATION DOSE REDUCTION: This exam was performed according to the departmental dose-optimization program which includes automated exposure control, adjustment of the mA and/or kV according to patient size and/or use of iterative reconstruction technique. COMPARISON:  CT cervical spine 03/02/2018.  FINDINGS: Alignment: Physiologic. Skull base and vertebrae: No evidence of acute cervical spine fracture or traumatic subluxation. Soft tissues and spinal canal: No prevertebral fluid or swelling. No visible canal hematoma. Disc levels: Mildly progressive multilevel cervical spondylosis, especially at C3-4 where there is progressive loss of disc height and endplate osteophyte formation. Spondylosis remains greatest at C5-6 and C6-7, not grossly changed from previous study. No large disc herniation identified. Upper chest: Clear lung apices. Other: Severe bilateral carotid atherosclerosis. IMPRESSION: 1. No evidence of acute cervical spine fracture, traumatic subluxation or static signs of instability. 2. Mildly progressive multilevel cervical spondylosis. 3. Severe bilateral carotid atherosclerosis. Electronically Signed   By: Carey Bullocks M.D.   On: 10/21/2022 09:48   DG Chest Portable 1 View  Result Date: 10/21/2022 CLINICAL DATA:  87 year old female with history of chest pain. EXAM: PORTABLE CHEST 1 VIEW COMPARISON:  Chest x-ray 09/14/2022. FINDINGS: Lung volumes are normal. No consolidative airspace disease. No pleural effusions. No pneumothorax. No pulmonary nodule or mass noted. Pulmonary vasculature and the cardiomediastinal silhouette are within normal limits. Atherosclerosis in the thoracic aorta. IMPRESSION: 1. No radiographic evidence of acute cardiopulmonary disease. 2. Aortic atherosclerosis. Electronically Signed   By: Trudie Reed M.D.   On: 10/21/2022 08:45    Dialysis Orders:  Center: [facility] on MWF. Started HD Jan 2023. Access: LUAAVF Qb: 350; Qd: 525 (1.5x Qb); Time: 3.5 hr; EDW: 55 kg Dialysate: 2K/2Ca No Heparin Last dialyzed on 8/30, off-wt: 54.5kg.   Assessment/Plan: ESRD: Patient volume status to be remedied w/ inpatient HD. Orders placed w/ HD team. Blood pressure has been modestly elevated since admission which I expect will be resolved after dialysis. Pt's weight at  admission of 54.9 kg (EDW 55 kg) and signs of increased volume suggest need for additional UF pull. Will attempt UF of 2.5 L at earliest available dialysis in the ICU. New EDW may need to be ~50kg, will defer to MD and HD team.  Avoid Nephrotoxins and monitor for signs of worsening neurologic or metabolic function. Trend renal function and electrolyte levels. No anticoagulation. Encourage oral intake following renal diet as appropriate. Monitor I&Os.  Frontal Contusion: Managed by neurosurgery. F/u CT scan in AM. Being monitored in ICU for signs of hemorrhagic conversion.  Hypertension: Managed outpatient on Metoprolol d/c'd upon admission. Inpatient BP remains a concern, will likely resolve with HD treatment. She is being treated with hydralazine at present. Trend BP and monitor for hypotension when undergoing HD, as she has a history of hypotension while being dialyzed.  Anemia of CKD: Trend CBCs. Doing well at present.  Hyperglycemia: Managed by CC team. On sliding scale.   Irving Burton [place], PA-S2 10/21/2022, 2:57 PM  [facility]  Pt seen, examined and agree w A/P as above.  Vinson Moselle MD  [facility] 10/21/2022, 4:47 PM

## 2022-10-22 ENCOUNTER — Inpatient Hospital Stay (HOSPITAL_COMMUNITY): Payer: Medicare PPO

## 2022-10-22 ENCOUNTER — Inpatient Hospital Stay (HOSPITAL_COMMUNITY)
Admission: RE | Admit: 2022-10-22 | Discharge: 2022-10-22 | Disposition: A | Discharge status: Home / Self Care | Payer: Medicare PPO | Source: Ambulatory Visit | Attending: Interventional Radiology | Admitting: Interventional Radiology

## 2022-10-22 DIAGNOSIS — S0003XA Contusion of scalp, initial encounter: Secondary | ICD-10-CM

## 2022-10-22 DIAGNOSIS — S069X1A Unspecified intracranial injury with loss of consciousness of 30 minutes or less, initial encounter: Secondary | ICD-10-CM

## 2022-10-22 HISTORY — PX: IR NEPHROSTOMY EXCHANGE LEFT: IMG6069

## 2022-10-22 LAB — GLUCOSE, CAPILLARY
Glucose-Capillary: 100 mg/dL — ABNORMAL HIGH (ref 70–99)
Glucose-Capillary: 84 mg/dL (ref 70–99)
Glucose-Capillary: 101 mg/dL — ABNORMAL HIGH (ref 70–99)
Glucose-Capillary: 108 mg/dL — ABNORMAL HIGH (ref 70–99)

## 2022-10-22 MED ORDER — CHLORHEXIDINE GLUCONATE CLOTH 2 % EX PADS
6.0000 | MEDICATED_PAD | Freq: Every day | CUTANEOUS | Status: DC
  Administered 2022-10-23: 6 via TOPICAL

## 2022-10-22 MED ORDER — LIDOCAINE HCL 1 % IJ SOLN
INTRAMUSCULAR | Status: AC
  Filled 2022-10-22: qty 20

## 2022-10-22 MED ORDER — IOHEXOL 300 MG/ML  SOLN
50.0000 mL | Freq: Once | INTRAMUSCULAR | Status: AC | PRN
  Administered 2022-10-22: 10 mL

## 2022-10-22 NOTE — Progress Notes (Signed)
Pt receives out-pt HD at [facility] on MWF. Will assist as needed.   Tracy Mounce Renal Navigator [phone] 

## 2022-10-22 NOTE — Progress Notes (Signed)
NAME:  Kim Burns, MRN:  [id number], DOB:  1935/10/07, LOS: 1 ADMISSION DATE:  10/21/2022, CONSULTATION DATE:  10/21/22 REFERRING MD:  Fredderick Phenix - EM , CHIEF COMPLAINT:  fall    History of Present Illness:  87 yo F PMH ESRD on  MWF HD, Afib formerly on AC (dc after spontaneous renal hematoma), HTN, s/p whipple +  (pancreatic mass, no malignancy, high grady dysplasia), RCC s/p embo/ ablation in 2023 c/b urine leak ultimately req L neph rube who was found face down on her kitchen floor by Son in Social worker. Has no memory of falling, sounds like + LOC. At the time that son in law found her she was asleep and woke up easily. Did complain of transient black spots visually.   In talking w family, it sounds like she is at baseline a very poor historian with variable vision, chronic weakness/shuffling gait and frequent trips/falls. They feel she is at baseline mental status in ED at time of our evaluation.   Imaging in ED reveals R frontal hemorrhagic contusion, non-displaced L parietal and occipital fx,  L posterior scalp hematoma  CT C spine non-acute CT L spine with a small nondisplaced S4 fx   ED has reached out to NSGY -- would like SBP < 160    Pertinent  Medical History  ESRD Afib HTN DM CAD  S/p whipple  Exocrine pancreatic insufficiency Ventral incisional hernia  Renal carcinoma s/p ablation (2023)   Significant Hospital Events: Including procedures, antibiotic start and stop dates in addition to other pertinent events   9/2 ED after a fall at home   Interim History / Subjective:  Repeat head CT showing stable right frontal contusion/hematoma Remained afebrile  Objective   Blood pressure 132/72, pulse 65, temperature 98 F (36.7 C), temperature source Oral, resp. rate 16, height 5\' 3"  (1.6 m), weight 52.5 kg, SpO2 94%.        Intake/Output Summary (Last 24 hours) at 10/22/2022 0748 Last data filed at 10/21/2022 1900 Gross per 24 hour  Intake --  Output 2400 ml  Net -2400 ml    Filed Weights   10/21/22 1534 10/21/22 1915 10/22/22 8841  Weight: 55 kg 52.5 kg 52.5 kg    Examination: General: Chronically ill-appearing female, lying on the bed HEENT: [place]/AT, eyes anicteric.  moist mucus membranes Neuro: Alert, awake following commands Chest: Coarse breath sounds, no wheezes or rhonchi Heart: Regular rate and rhythm, no murmurs or gallops Abdomen: Soft, nontender, nondistended, bowel sounds present Skin: No rash  Resolved Hospital Problem list     Assessment & Plan:  Status post fall likely due to syncopal episode TBI: R frontal contrecoup right frontal contusion/hematoma Non-displaced L parietal and L occipital skull fx Scalp hematoma S4 fracture  Appreciate neurosurgery input, recommend holding aspirin for 1 week and subcu Lovenox for total 3 days Repeat head CT is stable with no progression of right frontal contusion/hematoma Patient had recent echocardiogram which was normal Continue telemetry monitoring PT/OT evaluation Patient with frequent falls may need SNF/CIR upon discharge  Labile hypertension Patient is on metoprolol and midodrine at home, will hold off both meds Maintain blood pressure <160  ESRD Patient received hemodialysis yesterday, tolerated well.  2.4 L of volume was taken off  RCC s/p embolization/ ablation c/b urine leak, now s/p L neph tube Underwent nephrostomy tube exchange  S/p whipple (2018)  Exocrine pancreatic insufficiency  Continue Creon twice daily with meals  IDDM A1c is 5.8 Continue sliding scale insulin with  CBG goal 140-180   Best Practice (right click and "Reselect all SmartList Selections" daily)   Diet/type: Regular consistency (see orders) DVT prophylaxis: SCD.  Resume pharmacological DVT prophylaxis in 72 hours GI prophylaxis: N/A Lines: N/A Foley:  N/A Code Status:  full code  Last date of multidisciplinary goals of care discussion [10/21/22]  Labs   CBC: Recent Labs  Lab 10/21/22 0756  WBC  11.3*  NEUTROABS 9.5*  HGB 12.1  HCT 38.6  MCV 99.2  PLT 175    Basic Metabolic Panel: Recent Labs  Lab 10/21/22 0756  NA 132*  K 3.9  CL 98  CO2 20*  GLUCOSE 153*  BUN 40*  CREATININE 5.37*  CALCIUM 8.3*   GFR: Estimated Creatinine Clearance: 6.3 mL/min (A) (by C-G formula based on SCr of 5.37 mg/dL (H)). Recent Labs  Lab 10/21/22 0756  WBC 11.3*    Liver Function Tests: No results for input(s): "AST", "ALT", "ALKPHOS", "BILITOT", "PROT", "ALBUMIN" in the last 168 hours. No results for input(s): "LIPASE", "AMYLASE" in the last 168 hours. No results for input(s): "AMMONIA" in the last 168 hours.  ABG    Component Value Date/Time   PHART 7.309 (L) 04/23/2016 1356   PCO2ART 38.5 04/23/2016 1356   PO2ART 203.0 (H) 04/23/2016 1356   HCO3 20.8 01/22/2022 1257   TCO2 22 01/22/2022 1257   ACIDBASEDEF 7.0 (H) 01/22/2022 1257   O2SAT 87 01/22/2022 1257     Coagulation Profile: No results for input(s): "INR", "PROTIME" in the last 168 hours.  Cardiac Enzymes: No results for input(s): "CKTOTAL", "CKMB", "CKMBINDEX", "TROPONINI" in the last 168 hours.  HbA1C: Hgb A1c MFr Bld  Date/Time Value Ref Range Status  09/14/2022 05:12 AM 5.8 (H) 4.8 - 5.6 % Final    Comment:    (NOTE)         Prediabetes: 5.7 - 6.4         Diabetes: >6.4         Glycemic control for adults with diabetes: <7.0   01/22/2022 05:06 AM 6.6 (H) 4.8 - 5.6 % Final    Comment:    (NOTE)         Prediabetes: 5.7 - 6.4         Diabetes: >6.4         Glycemic control for adults with diabetes: <7.0     CBG: Recent Labs  Lab 10/21/22 1247 10/21/22 1453 10/21/22 1533 10/21/22 2129 10/22/22 0723  GLUCAP 96 131* 109* 107* 100*     Cheri Fowler, MD [facility] See Amion for pager If no response to pager, please call [phone] until 7pm After 7pm, Please call E-link [phone]

## 2022-10-22 NOTE — Progress Notes (Signed)
Follow up head ct stable.  Cont to hold asa for 1 week.may start lovenox in 72 hours.

## 2022-10-22 NOTE — Progress Notes (Signed)
[facility] Progress Note   Subjective:   Patient seen in bed, denies issues w/ dialysis, chest pain, shortness of breath, or access site issues. She states she is doing well but ready to go home.   Objective Vitals:   10/22/22 0825 10/22/22 0900 10/22/22 1000 10/22/22 1100  BP: (!) 153/77  (!) 151/87 (!) 131/118  Pulse: 65 63 61 62  Resp: 20 19 14 16   Temp:      TempSrc:      SpO2: 100% 98% 98% 100%  Weight:      Height:       Physical Exam General: Frail-appearing elderly female lying supine on hospital bed.  Heart:RRR, no M/R/G Lungs: Fine bibasilar crackles posteriorly.  Abdomen: soft, non-tender, non-distended.  Extremities: Trace edema in RLE, 1+ edema in LLE. No sores or wounds.  Dialysis Access: LUAAVF present in good repair with bruit and thrill. No erythema/edema/warmth to palpation.   Additional Objective Labs: Basic Metabolic Panel: Recent Labs  Lab 10/21/22 0756  NA 132*  K 3.9  CL 98  CO2 20*  GLUCOSE 153*  BUN 40*  CREATININE 5.37*  CALCIUM 8.3*   CBC: Recent Labs  Lab 10/21/22 0756  WBC 11.3*  NEUTROABS 9.5*  HGB 12.1  HCT 38.6  MCV 99.2  PLT 175   Blood Culture    Component Value Date/Time   SDES BLOOD RIGHT ANTECUBITAL 01/22/2022 0038   SPECREQUEST  01/22/2022 0038    BOTTLES DRAWN AEROBIC AND ANAEROBIC Blood Culture results may not be optimal due to an inadequate volume of blood received in culture bottles   CULT  01/22/2022 0038    NO GROWTH 5 DAYS Performed at [facility], [address], [place], [place] [address]    REPTSTATUS 01/27/2022 FINAL 01/22/2022 0038    CBG: Recent Labs  Lab 10/21/22 1453 10/21/22 1533 10/21/22 2129 10/22/22 0723 10/22/22 1105  GLUCAP 131* 109* 107* 100* 84    Studies/Results: IR NEPHROSTOMY EXCHANGE LEFT  Result Date: 10/22/2022 INDICATION: Chronic indwelling left nephrostomy catheter EXAM: FLUOROSCOPIC EXCHANGE OF THE 10 FRENCH LEFT NEPHROSTOMY COMPARISON:   08/26/2022 MEDICATIONS: 1% lidocaine local ANESTHESIA/SEDATION: None. CONTRAST:  10 cc-administered into the collecting system(s) FLUOROSCOPY: Radiation Exposure Index (as provided by the fluoroscopic device): 7.0 mGy Kerma COMPLICATIONS: None immediate. PROCEDURE: Informed written consent was obtained from the patient after a thorough discussion of the procedural risks, benefits and alternatives. All questions were addressed. Maximal Sterile Barrier Technique was utilized including caps, mask, sterile gowns, sterile gloves, sterile drape, hand hygiene and skin antiseptic. A timeout was performed prior to the initiation of the procedure. Previous imaging reviewed. Initial catheter injection performed. Nephrostomy catheter is within the renal pelvis. Successful exchange performed over a Amplatz guidewire. Retention loop reformed in the renal pelvis. Contrast injection confirms position. Images obtained for documentation. Catheter secured with a skin suture and a sterile dressing. Gravity drainage bag connected. No immediate complication. Patient tolerated the procedure well. IMPRESSION: Successful fluoroscopic 10 French left nephrostomy exchange Electronically Signed   By: Judie Petit.  Shick M.D.   On: 10/22/2022 11:15   CT HEAD WO CONTRAST ( )  Result Date: 10/22/2022 CLINICAL DATA:  Traumatic brain injury. EXAM: CT HEAD WITHOUT CONTRAST TECHNIQUE: Contiguous axial images were obtained from the base of the skull through the vertex without intravenous contrast. RADIATION DOSE REDUCTION: This exam was performed according to the departmental dose-optimization program which includes automated exposure control, adjustment of the mA and/or kV according to patient size and/or use  of iterative reconstruction technique. COMPARISON:  Yesterday FINDINGS: Brain: Right inferior frontal hematoma with rim of edema. The hemorrhagic portion measures 2 cm in diameter. No new site of hemorrhage or progression. No infarct, hydrocephalus,  or shift Vascular: Atheromatous calcification. Skull: Nondepressed left occipital bone fracture with overlying hematoma. Sinuses/Orbits: Negative IMPRESSION: No new finding or progression of the patient's right frontal hematoma. Electronically Signed   By: Tiburcio Pea M.D.   On: 10/22/2022 07:46   DG Foot 2 Views Right  Result Date: 10/21/2022 CLINICAL DATA:  Unwitnessed fall.  Right foot pain EXAM: RIGHT FOOT - 2 VIEW COMPARISON:  04/19/2021 FINDINGS: Two views of the right foot demonstrate no evidence of an acute fracture. No dislocation. Bones are demineralized. Hallux valgus deformity with mild osteoarthritis of the first MTP joint. Bidirectional calcaneal enthesophytes. Generalized soft tissue swelling. Atherosclerotic vascular calcification. IMPRESSION: Generalized soft tissue swelling without acute fracture or dislocation of the right foot. Electronically Signed   By: Duanne Guess D.O.   On: 10/21/2022 17:32   CT Head Wo Contrast  Addendum Date: 10/21/2022   ADDENDUM REPORT: 10/21/2022 10:13 ADDENDUM: Study discussed by telephone with Dr. Shawna Orleans BELFI on 10/21/2022 at 0956 hours. Electronically Signed   By: Odessa Fleming M.D.   On: 10/21/2022 10:13   Result Date: 10/21/2022 CLINICAL DATA:  87 year old female dialysis patient with syncope, fall. Found down. EXAM: CT HEAD WITHOUT CONTRAST TECHNIQUE: Contiguous axial images were obtained from the base of the skull through the vertex without intravenous contrast. RADIATION DOSE REDUCTION: This exam was performed according to the departmental dose-optimization program which includes automated exposure control, adjustment of the mA and/or kV according to patient size and/or use of iterative reconstruction technique. COMPARISON:  Head CT 01/31/2022.  More recent brain MRI 09/14/2022. FINDINGS: Brain: Right anterior frontal lobe, inferior frontal gyrus hemorrhagic contusion is rounded, up to 2 cm diameter. Mild adjacent edema. No significant regional mass  effect. No convincing subarachnoid or subdural blood. No IVH. No ventriculomegaly. No midline shift. Basilar cisterns remain normal. Outside the area of acute parenchymal contusion the gray-white matter differentiation is stable. Vascular: Calcified atherosclerosis at the skull base. No suspicious intracranial vascular hyperdensity. Skull: Nondisplaced left parietal and occipital bone fracture. Sinuses/Orbits: Visualized paranasal sinuses and mastoids are stable and well aerated. Other: Leftward gaze. No acute orbit or scalp *CRASH* that broad-based left posterior scalp hematoma measures up to 12 mm thickness at the occiput put and there is an underlying nondisplaced left occipital bone fracture tracking toward but not directly involving the foramen magnum (series 4, image 20). Hematoma and the fracture tracks cephalad before dissipating. Traumatic Brain Injury Risk Stratification Skull Fracture: Nondisplaced - Moderate/mBIG 2 Subdural Hematoma (SDH): No - Low Subarachnoid Hemorrhage [facility]): No Epidural Hematoma (EDH): No - Low / mBIG 1 Cerebral contusion, intra-axial, intraparenchymal Hemorrhage (IPH): 8mm plus or multiple - High/mBIG 3 Intraventricular Hemorrhage (IVH): No - Low / mBIG 1 Midline Shift > 1mm or Edema/effacement of sulci/vents: No - Low / mBIG 1 ---------------------------------------------------- IMPRESSION: 1. Positive for acute coup contrecoup brain injury with nondisplaced left posterior skull fracture, right anterior frontal lobe hemorrhagic contusion. No midline shift or other complicating features. 2. Associated left posterior scalp hematoma. Electronically Signed: By: Odessa Fleming M.D. On: 10/21/2022 09:53   CT Lumbar Spine Wo Contrast  Result Date: 10/21/2022 CLINICAL DATA:  Back trauma, no prior imaging (Age >= 16y).  Pain EXAM: CT LUMBAR SPINE WITHOUT CONTRAST TECHNIQUE: Multidetector CT imaging of the lumbar spine was performed without  intravenous contrast administration. Multiplanar CT  image reconstructions were also generated. RADIATION DOSE REDUCTION: This exam was performed according to the departmental dose-optimization program which includes automated exposure control, adjustment of the mA and/or kV according to patient size and/or use of iterative reconstruction technique. COMPARISON:  06/20/2022 FINDINGS: Segmentation: 5 lumbar type vertebrae. Alignment: No traumatic listhesis. Vertebrae: Diffuse osseous demineralization. Prior laminectomy on the right at L5. There is a subtle nondisplaced fracture of the anterior cortex of the sacrum at S4 (series 8, image 34). Lumbar vertebral body heights are maintained without evidence of fracture. No suspicious bone lesion. Paraspinal and other soft tissues: Aortic atherosclerosis. There is some mild wall thickening of the imaged distal sigmoid colon. Disc levels: Moderate-advanced multilevel degenerative disc disease of the lumbar spine. IMPRESSION: 1. Subtle nondisplaced fracture of the anterior cortex of the sacrum at S4. 2. No acute fracture or traumatic listhesis of the lumbar spine. 3. Mild wall thickening of the imaged distal sigmoid colon. Correlate for signs and symptoms of colitis. 4. Aortic atherosclerosis (ICD10-I70.0). Electronically Signed   By: Duanne Guess D.O.   On: 10/21/2022 09:50   CT Cervical Spine Wo Contrast  Result Date: 10/21/2022 CLINICAL DATA:  Neck trauma.  Head trauma. EXAM: CT CERVICAL SPINE WITHOUT CONTRAST TECHNIQUE: Multidetector CT imaging of the cervical spine was performed without intravenous contrast. Multiplanar CT image reconstructions were also generated. RADIATION DOSE REDUCTION: This exam was performed according to the departmental dose-optimization program which includes automated exposure control, adjustment of the mA and/or kV according to patient size and/or use of iterative reconstruction technique. COMPARISON:  CT cervical spine 03/02/2018. FINDINGS: Alignment: Physiologic. Skull base and  vertebrae: No evidence of acute cervical spine fracture or traumatic subluxation. Soft tissues and spinal canal: No prevertebral fluid or swelling. No visible canal hematoma. Disc levels: Mildly progressive multilevel cervical spondylosis, especially at C3-4 where there is progressive loss of disc height and endplate osteophyte formation. Spondylosis remains greatest at C5-6 and C6-7, not grossly changed from previous study. No large disc herniation identified. Upper chest: Clear lung apices. Other: Severe bilateral carotid atherosclerosis. IMPRESSION: 1. No evidence of acute cervical spine fracture, traumatic subluxation or static signs of instability. 2. Mildly progressive multilevel cervical spondylosis. 3. Severe bilateral carotid atherosclerosis. Electronically Signed   By: Carey Bullocks M.D.   On: 10/21/2022 09:48   DG Chest Portable 1 View  Result Date: 10/21/2022 CLINICAL DATA:  87 year old female with history of chest pain. EXAM: PORTABLE CHEST 1 VIEW COMPARISON:  Chest x-ray 09/14/2022. FINDINGS: Lung volumes are normal. No consolidative airspace disease. No pleural effusions. No pneumothorax. No pulmonary nodule or mass noted. Pulmonary vasculature and the cardiomediastinal silhouette are within normal limits. Atherosclerosis in the thoracic aorta. IMPRESSION: 1. No radiographic evidence of acute cardiopulmonary disease. 2. Aortic atherosclerosis. Electronically Signed   By: Trudie Reed M.D.   On: 10/21/2022 08:45   Medications:   amiodarone  200 mg Oral Daily   Chlorhexidine Gluconate Cloth  6 each Topical Daily   insulin aspart  0-6 Units Subcutaneous TID WC   lipase/protease/amylase  36,000 Units Oral BID WC   prednisoLONE acetate  1 drop Both Eyes BID   valACYclovir  500 mg Oral QHS    Dialysis Orders: Center: [place] on MWF. Started HD Jan 2023. Access: LUAAVF Qb: 350; Qd: 525 (1.5x Qb); Time: 3.5 hr; EDW: 55 kg Dialysate: 2K/2Ca No Heparin Last dialyzed on 8/30, off-wt:  54.5kg.  Assessment/Plan: ESRD: Volume status improved. Will continue scheduled inpatient IHD until  patient is discharged, at which point she will resume outpatient center IHD. Treatment on 9/2 was well tolerated by pt. and yielded 2.4 L UF, with current weight being 52.5 kg (2.5kg under dry wt). Can cont to lower vol and dry wt w/ next HD. Continue to trend renal panel and monitor for changes in symptoms or dialysis associated cramping, pruritus, or access site issues. Continue renal diet and 2L fluid restriction. Avoid nephrotoxins. HD tomorrow.  Frontal Contusion: Managed by neurosurgery. F/u CT was normal. She seems to be improved from my interview yesterday and somewhat conversational.  Hypertension: BP is improved since 9/2. She is being treated with hydralazine at present. Trend BP and monitor for hypotension when undergoing HD. Anemia of CKD: Trend CBCs. Doing well at present.  Hyperglycemia: Managed by CC team. On sliding scale.   Irving Burton Millsaps, PA-S2 10/22/2022, 11:35 AM  [facility]  Pt seen, examined and agree w A/P as above.  Vinson Moselle MD  [facility] 10/22/2022, 1:03 PM

## 2022-10-22 NOTE — TOC CAGE-AID Note (Signed)
Transition of Care [facility]) - CAGE-AID Screening   Patient Details  Name: Kim Burns MRN: [id number] Date of Birth: 1936/02/03   Hewitt Shorts, RN Trauma Response Nurse  Phone Number: [phone] 10/22/2022, 11:12 AM      CAGE-AID Screening:    Have You Ever Felt You Ought to Cut Down on Your Drinking or Drug Use?: No Have People Annoyed You By Critizing Your Drinking Or Drug Use?: No Have You Felt Bad Or Guilty About Your Drinking Or Drug Use?: No Have You Ever Had a Drink or Used Drugs First Thing In The Morning to Steady Your Nerves or to Get Rid of a Hangover?: No CAGE-AID Score: 0  Substance Abuse Education Offered: No (denies alcohol use/denies smoking)

## 2022-10-22 NOTE — Procedures (Signed)
Interventional Radiology Procedure Note  Procedure: fluoro left pcn exchg    Complications: None  Estimated Blood Loss:  min  Findings: 10 fr pcn    Sharen Counter, MD

## 2022-10-23 DIAGNOSIS — I4891 Unspecified atrial fibrillation: Secondary | ICD-10-CM

## 2022-10-23 DIAGNOSIS — S0631AA Contusion and laceration of right cerebrum with loss of consciousness status unknown, initial encounter: Secondary | ICD-10-CM | POA: Diagnosis not present

## 2022-10-23 LAB — BASIC METABOLIC PANEL
Anion gap: 19 — ABNORMAL HIGH (ref 5–15)
Anion gap: 14 (ref 5–15)
BUN: 45 mg/dL — ABNORMAL HIGH (ref 8–23)
BUN: 46 mg/dL — ABNORMAL HIGH (ref 8–23)
CO2: 19 mmol/L — ABNORMAL LOW (ref 22–32)
CO2: 23 mmol/L (ref 22–32)
Calcium: 8.1 mg/dL — ABNORMAL LOW (ref 8.9–10.3)
Calcium: 7.6 mg/dL — ABNORMAL LOW (ref 8.9–10.3)
Chloride: 95 mmol/L — ABNORMAL LOW (ref 98–111)
Chloride: 95 mmol/L — ABNORMAL LOW (ref 98–111)
Creatinine, Ser: 4.34 mg/dL — ABNORMAL HIGH (ref 0.44–1.00)
Creatinine, Ser: 4.32 mg/dL — ABNORMAL HIGH (ref 0.44–1.00)
GFR, Estimated: 9 mL/min — ABNORMAL LOW (ref >60)
GFR, Estimated: 10 mL/min — ABNORMAL LOW (ref >60)
Glucose, Bld: 114 mg/dL — ABNORMAL HIGH (ref 70–99)
Glucose, Bld: 111 mg/dL — ABNORMAL HIGH (ref 70–99)
Potassium: 5.1 mmol/L (ref 3.5–5.1)
Potassium: 3.8 mmol/L (ref 3.5–5.1)
Sodium: 133 mmol/L — ABNORMAL LOW (ref 135–145)
Sodium: 132 mmol/L — ABNORMAL LOW (ref 135–145)

## 2022-10-23 LAB — CBC
HCT: 32.8 % — ABNORMAL LOW (ref 36.0–46.0)
Hemoglobin: 10.6 g/dL — ABNORMAL LOW (ref 12.0–15.0)
MCH: 30.6 pg (ref 26.0–34.0)
MCHC: 32.3 g/dL (ref 30.0–36.0)
MCV: 94.8 fL (ref 80.0–100.0)
Platelets: 179 10*3/uL (ref 150–400)
RBC: 3.46 MIL/uL — ABNORMAL LOW (ref 3.87–5.11)
RDW: 18.1 % — ABNORMAL HIGH (ref 11.5–15.5)
WBC: 8.7 10*3/uL (ref 4.0–10.5)
nRBC: 0 % (ref 0.0–0.2)

## 2022-10-23 LAB — HEPATITIS B SURFACE ANTIBODY, QUANTITATIVE: Hep B S AB Quant (Post): <3.5 m[IU]/mL — ABNORMAL LOW

## 2022-10-23 LAB — GLUCOSE, CAPILLARY: Glucose-Capillary: 155 mg/dL — ABNORMAL HIGH (ref 70–99)

## 2022-10-23 LAB — PHOSPHORUS
Phosphorus: 8.6 mg/dL — ABNORMAL HIGH (ref 2.5–4.6)
Phosphorus: 8.2 mg/dL — ABNORMAL HIGH (ref 2.5–4.6)

## 2022-10-23 MED ORDER — METOPROLOL TARTRATE 25 MG PO TABS: 25.0000 mg | ORAL_TABLET | Freq: Two times a day (BID) | ORAL | Status: DC

## 2022-10-23 MED ORDER — LIDOCAINE-PRILOCAINE 2.5-2.5 % EX CREA: 1.0000 | TOPICAL_CREAM | CUTANEOUS | Status: DC | PRN

## 2022-10-23 MED ORDER — ALTEPLASE 2 MG IJ SOLR: 2.0000 mg | Freq: Once | INTRAMUSCULAR | Status: DC | PRN

## 2022-10-23 MED ORDER — AMIODARONE HCL IN DEXTROSE 360-4.14 MG/200ML-% IV SOLN
60.0000 mg/h | INTRAVENOUS | Status: AC
  Administered 2022-10-23 (×2): 60 mg/h via INTRAVENOUS
  Filled 2022-10-23 (×2): qty 200

## 2022-10-23 MED ORDER — LIDOCAINE HCL (PF) 1 % IJ SOLN: 5.0000 mL | INTRAMUSCULAR | Status: DC | PRN

## 2022-10-23 MED ORDER — SEVELAMER CARBONATE 800 MG PO TABS
2400.0000 mg | ORAL_TABLET | Freq: Three times a day (TID) | ORAL | Status: DC
  Administered 2022-10-27 – 2022-10-29 (×5): 2400 mg via ORAL
  Administered 2022-10-29 – 2022-10-30 (×3): 800 mg via ORAL
  Filled 2022-10-23 (×12): qty 3

## 2022-10-23 MED ORDER — AMIODARONE HCL IN DEXTROSE 360-4.14 MG/200ML-% IV SOLN
30.0000 mg/h | INTRAVENOUS | Status: DC
  Administered 2022-10-24 – 2022-10-26 (×4): 30 mg/h via INTRAVENOUS
  Filled 2022-10-23 (×5): qty 200

## 2022-10-23 MED ORDER — METOPROLOL TARTRATE 5 MG/5ML IV SOLN
2.5000 mg | Freq: Once | INTRAVENOUS | Status: AC | PRN
  Administered 2022-10-23: 2.5 mg via INTRAVENOUS
  Filled 2022-10-23: qty 5

## 2022-10-23 MED ORDER — ANTICOAGULANT SODIUM CITRATE 4% (200MG/5ML) IV SOLN: 5.0000 mL | Status: DC | PRN

## 2022-10-23 MED ORDER — PENTAFLUOROPROP-TETRAFLUOROETH EX AERO: 1.0000 | INHALATION_SPRAY | CUTANEOUS | Status: DC | PRN

## 2022-10-23 MED ORDER — NEPRO/CARBSTEADY PO LIQD: 237.0000 mL | ORAL | Status: DC | PRN

## 2022-10-23 MED ORDER — METOPROLOL TARTRATE 5 MG/5ML IV SOLN
2.5000 mg | Freq: Once | INTRAVENOUS | Status: AC
  Administered 2022-10-23: 2.5 mg via INTRAVENOUS
  Filled 2022-10-23: qty 5

## 2022-10-23 MED ORDER — METOPROLOL TARTRATE 25 MG PO TABS
12.5000 mg | ORAL_TABLET | Freq: Two times a day (BID) | ORAL | Status: DC
  Administered 2022-10-23: 12.5 mg via ORAL
  Filled 2022-10-23 (×2): qty 1

## 2022-10-23 MED ORDER — HEPARIN SODIUM (PORCINE) 1000 UNIT/ML DIALYSIS: 1000.0000 [IU] | INTRAMUSCULAR | Status: DC | PRN

## 2022-10-23 MED ORDER — AMIODARONE LOAD VIA INFUSION
150.0000 mg | Freq: Once | INTRAVENOUS | Status: AC
  Administered 2022-10-23: 150 mg via INTRAVENOUS
  Filled 2022-10-23: qty 83.34

## 2022-10-23 MED ORDER — METOPROLOL TARTRATE 12.5 MG HALF TABLET
12.5000 mg | ORAL_TABLET | Freq: Two times a day (BID) | ORAL | Status: DC
  Filled 2022-10-23: qty 1

## 2022-10-23 NOTE — Progress Notes (Addendum)
PROGRESS NOTE    Kim Burns  AOZ:[id number] DOB: April 17, 1935 DOA: 10/21/2022 PCP: Lewis Moccasin, MD   Brief Narrative: 87 year old past medical history significant for Whipple procedure, small bowel resection, due to nonmalignant pancreatic mass, RCC with left renal embolization and ablation with nephrostomy tube in place, hypertension, A-fib not on anticoagulation, ESRD on hemodialysis MWF, her son-in-law went to pick her up for hemodialysis on 9/2 and found her asleep on the floor.  She does not remember falling.  Evaluation in the ED patient was found to have a right frontal hemorrhagic contusion and nondisplaced left parietal and occipital fracture, left posterior scalp hematoma, small nondisplaced S4 fracture.  Patient admitted for traumatic brain injury after a fall with apparent loss of consciousness versus mechanical fall with intact consciousness.  Patient was evaluated by neurosurgery, admitted to the ICU,  systolic blood pressure recommended 90-160.  Assessment & Plan:   Principal Problem:   Traumatic brain injury [facility]) Active Problems:   Contusion of right cerebral hemisphere [facility])   Closed skull fracture (HCC)   Sacral fracture, closed (HCC)   Exocrine pancreatic insufficiency   Hypertension  1-TBI, right frontal contrecoup hemorrhagic cerebral contusion Nondisplaced left parietal and left occipital skull fracture Scalp hematoma Skull fracture: -CT head is stable.  Per neurosurgery continue to hold aspirin for 1 week, may start Lovenox for DVT prophylaxis in 72 hours from 9/3 -Awaiting PT OT recommendation -more confuse today. Nurse will inform neurosurgery.  More confuse today per son,. I will proceed with Repeat CT head.   2-Hypertension/hypotension -She gets midodrine with hemodialysis otherwise she is on metoprolol. These meds has been on hold.  -metoprolol has been on hold. Now with A fib RVR--resume low dose metoprolol.   A fib RVR; continue with  amiodarone.  Resume low dose Metoprolol.  Gave her IV metoprolol times one 2.5. HR still 130. I have consulted cardiology.   ESRD on hemodialysis -MWF Appreciate nephrology assistance Couldn't complete 4 hours of HD today due to tachycardia. Tx for 2 hours.   RCC s/p embolization/ablation, status post left nephrostomy tube Status post nephrostomy tube exchange on Status post left nephrostomy tube exchange 9/3   S/ post Whipple 2018 Exocrine pancreatic insufficiency  IDDM:  -SSI  Frequent fall.  Will need orthostatic vitals.  Needs PT/OT Subtle nondisplaced fracture of the anterior cortex of the sacrum at S4. Pain management.                   Estimated body mass index is 22.57 kg/m as calculated from the following:   Height as of this encounter: 5\' 3"  (1.6 m).   Weight as of this encounter: 57.8 kg.   DVT prophylaxis: SCD Code Status: Full code Family Communication: Disposition Plan:  Status is: Inpatient Remains inpatient appropriate because: management of cerebral contusion.     Consultants:  Neurosurgery Nephrology   Procedures:  none  Antimicrobials:    Subjective: She came from HD, she doesn't feel well today. She has been noticed to be confused, some hallucination during HD>  Nurse will informed neurosurgery.   Objective: Vitals:   10/23/22 0400 10/23/22 0500 10/23/22 0600 10/23/22 0700  BP:  137/88 (!) 155/83 (!) 159/91  Pulse: 80 86 77 81  Resp:      Temp: 98.3 F (36.8 C)     TempSrc: Oral     SpO2: 100% (!) 69% 97% 98%  Weight:  57.8 kg    Height:  Intake/Output Summary (Last 24 hours) at 10/23/2022 0746 Last data filed at 10/23/2022 0700 Gross per 24 hour  Intake 600 ml  Output 20 ml  Net 580 ml   Filed Weights   10/21/22 1915 10/22/22 0712 10/23/22 0500  Weight: 52.5 kg 52.5 kg 57.8 kg    Examination:  General exam: Appears calm and comfortable  Respiratory system: Clear to auscultation. Respiratory effort  normal. Cardiovascular system: S1 & S2 heard, IRR Gastrointestinal system: Abdomen is nondistended, soft and nontender. No organomegaly or masses felt. Normal bowel sounds heard. Central nervous system: open eyes to voice, answer few questions, follows some command.  Extremities: no edema  Data Reviewed: I have personally reviewed following labs and imaging studies  CBC: Recent Labs  Lab 10/21/22 0756  WBC 11.3*  NEUTROABS 9.5*  HGB 12.1  HCT 38.6  MCV 99.2  PLT 175   Basic Metabolic Panel: Recent Labs  Lab 10/21/22 0756 10/23/22 0603  NA 132* 133*  K 3.9 5.1  CL 98 95*  CO2 20* 19*  GLUCOSE 153* 114*  BUN 40* 45*  CREATININE 5.37* 4.34*  CALCIUM 8.3* 8.1*  PHOS  --  8.6*   GFR: Estimated Creatinine Clearance: 7.8 mL/min (A) (by C-G formula based on SCr of 4.34 mg/dL (H)). Liver Function Tests: No results for input(s): "AST", "ALT", "ALKPHOS", "BILITOT", "PROT", "ALBUMIN" in the last 168 hours. No results for input(s): "LIPASE", "AMYLASE" in the last 168 hours. No results for input(s): "AMMONIA" in the last 168 hours. Coagulation Profile: No results for input(s): "INR", "PROTIME" in the last 168 hours. Cardiac Enzymes: No results for input(s): "CKTOTAL", "CKMB", "CKMBINDEX", "TROPONINI" in the last 168 hours. BNP (last 3 results) No results for input(s): "PROBNP" in the last 8760 hours. HbA1C: No results for input(s): "HGBA1C" in the last 72 hours. CBG: Recent Labs  Lab 10/21/22 2129 10/22/22 0723 10/22/22 1105 10/22/22 1553 10/22/22 2246  GLUCAP 107* 100* 84 101* 108*   Lipid Profile: No results for input(s): "CHOL", "HDL", "LDLCALC", "TRIG", "CHOLHDL", "LDLDIRECT" in the last 72 hours. Thyroid Function Tests: No results for input(s): "TSH", "T4TOTAL", "FREET4", "T3FREE", "THYROIDAB" in the last 72 hours. Anemia Panel: No results for input(s): "VITAMINB12", "FOLATE", "FERRITIN", "TIBC", "IRON", "RETICCTPCT" in the last 72 hours. Sepsis Labs: No  results for input(s): "PROCALCITON", "LATICACIDVEN" in the last 168 hours.  Recent Results (from the past 240 hour(s))  MRSA Next Gen by PCR, Nasal     Status: None   Collection Time: 10/21/22 12:32 PM   Specimen: Nasal Mucosa; Nasal Swab  Result Value Ref Range Status   MRSA by PCR Next Gen NOT DETECTED NOT DETECTED Final    Comment: (NOTE) The GeneXpert MRSA Assay (FDA approved for NASAL specimens only), is one component of a comprehensive MRSA colonization surveillance program. It is not intended to diagnose MRSA infection nor to guide or monitor treatment for MRSA infections. Test performance is not FDA approved in patients less than 50 years old. Performed at [facility], [address], [place], [place] [address]          Radiology Studies: IR NEPHROSTOMY EXCHANGE LEFT  Result Date: 10/22/2022 INDICATION: Chronic indwelling left nephrostomy catheter EXAM: FLUOROSCOPIC EXCHANGE OF THE 10 FRENCH LEFT NEPHROSTOMY COMPARISON:  08/26/2022 MEDICATIONS: 1% lidocaine local ANESTHESIA/SEDATION: None. CONTRAST:  10 cc-administered into the collecting system(s) FLUOROSCOPY: Radiation Exposure Index (as provided by the fluoroscopic device): 7.0 mGy Kerma COMPLICATIONS: None immediate. PROCEDURE: Informed written consent was obtained from the patient after a  thorough discussion of the procedural risks, benefits and alternatives. All questions were addressed. Maximal Sterile Barrier Technique was utilized including caps, mask, sterile gowns, sterile gloves, sterile drape, hand hygiene and skin antiseptic. A timeout was performed prior to the initiation of the procedure. Previous imaging reviewed. Initial catheter injection performed. Nephrostomy catheter is within the renal pelvis. Successful exchange performed over a Amplatz guidewire. Retention loop reformed in the renal pelvis. Contrast injection confirms position. Images obtained for documentation. Catheter secured with a skin suture and  a sterile dressing. Gravity drainage bag connected. No immediate complication. Patient tolerated the procedure well. IMPRESSION: Successful fluoroscopic 10 French left nephrostomy exchange Electronically Signed   By: Judie Petit.  Shick M.D.   On: 10/22/2022 11:15   CT HEAD WO CONTRAST ( )  Result Date: 10/22/2022 CLINICAL DATA:  Traumatic brain injury. EXAM: CT HEAD WITHOUT CONTRAST TECHNIQUE: Contiguous axial images were obtained from the base of the skull through the vertex without intravenous contrast. RADIATION DOSE REDUCTION: This exam was performed according to the departmental dose-optimization program which includes automated exposure control, adjustment of the mA and/or kV according to patient size and/or use of iterative reconstruction technique. COMPARISON:  Yesterday FINDINGS: Brain: Right inferior frontal hematoma with rim of edema. The hemorrhagic portion measures 2 cm in diameter. No new site of hemorrhage or progression. No infarct, hydrocephalus, or shift Vascular: Atheromatous calcification. Skull: Nondepressed left occipital bone fracture with overlying hematoma. Sinuses/Orbits: Negative IMPRESSION: No new finding or progression of the patient's right frontal hematoma. Electronically Signed   By: Tiburcio Pea M.D.   On: 10/22/2022 07:46   DG Foot 2 Views Right  Result Date: 10/21/2022 CLINICAL DATA:  Unwitnessed fall.  Right foot pain EXAM: RIGHT FOOT - 2 VIEW COMPARISON:  04/19/2021 FINDINGS: Two views of the right foot demonstrate no evidence of an acute fracture. No dislocation. Bones are demineralized. Hallux valgus deformity with mild osteoarthritis of the first MTP joint. Bidirectional calcaneal enthesophytes. Generalized soft tissue swelling. Atherosclerotic vascular calcification. IMPRESSION: Generalized soft tissue swelling without acute fracture or dislocation of the right foot. Electronically Signed   By: Duanne Guess D.O.   On: 10/21/2022 17:32   CT Head Wo Contrast  Addendum  Date: 10/21/2022   ADDENDUM REPORT: 10/21/2022 10:13 ADDENDUM: Study discussed by telephone with Dr. Shawna Orleans BELFI on 10/21/2022 at 0956 hours. Electronically Signed   By: Odessa Fleming M.D.   On: 10/21/2022 10:13   Result Date: 10/21/2022 CLINICAL DATA:  87 year old female dialysis patient with syncope, fall. Found down. EXAM: CT HEAD WITHOUT CONTRAST TECHNIQUE: Contiguous axial images were obtained from the base of the skull through the vertex without intravenous contrast. RADIATION DOSE REDUCTION: This exam was performed according to the departmental dose-optimization program which includes automated exposure control, adjustment of the mA and/or kV according to patient size and/or use of iterative reconstruction technique. COMPARISON:  Head CT 01/31/2022.  More recent brain MRI 09/14/2022. FINDINGS: Brain: Right anterior frontal lobe, inferior frontal gyrus hemorrhagic contusion is rounded, up to 2 cm diameter. Mild adjacent edema. No significant regional mass effect. No convincing subarachnoid or subdural blood. No IVH. No ventriculomegaly. No midline shift. Basilar cisterns remain normal. Outside the area of acute parenchymal contusion the gray-white matter differentiation is stable. Vascular: Calcified atherosclerosis at the skull base. No suspicious intracranial vascular hyperdensity. Skull: Nondisplaced left parietal and occipital bone fracture. Sinuses/Orbits: Visualized paranasal sinuses and mastoids are stable and well aerated. Other: Leftward gaze. No acute orbit or scalp *CRASH* that broad-based left posterior scalp  hematoma measures up to 12 mm thickness at the occiput put and there is an underlying nondisplaced left occipital bone fracture tracking toward but not directly involving the foramen magnum (series 4, image 20). Hematoma and the fracture tracks cephalad before dissipating. Traumatic Brain Injury Risk Stratification Skull Fracture: Nondisplaced - Moderate/mBIG 2 Subdural Hematoma (SDH): No - Low  Subarachnoid Hemorrhage [facility]): No Epidural Hematoma (EDH): No - Low / mBIG 1 Cerebral contusion, intra-axial, intraparenchymal Hemorrhage (IPH): 8mm plus or multiple - High/mBIG 3 Intraventricular Hemorrhage (IVH): No - Low / mBIG 1 Midline Shift > 1mm or Edema/effacement of sulci/vents: No - Low / mBIG 1 ---------------------------------------------------- IMPRESSION: 1. Positive for acute coup contrecoup brain injury with nondisplaced left posterior skull fracture, right anterior frontal lobe hemorrhagic contusion. No midline shift or other complicating features. 2. Associated left posterior scalp hematoma. Electronically Signed: By: Odessa Fleming M.D. On: 10/21/2022 09:53   CT Lumbar Spine Wo Contrast  Result Date: 10/21/2022 CLINICAL DATA:  Back trauma, no prior imaging (Age >= 16y).  Pain EXAM: CT LUMBAR SPINE WITHOUT CONTRAST TECHNIQUE: Multidetector CT imaging of the lumbar spine was performed without intravenous contrast administration. Multiplanar CT image reconstructions were also generated. RADIATION DOSE REDUCTION: This exam was performed according to the departmental dose-optimization program which includes automated exposure control, adjustment of the mA and/or kV according to patient size and/or use of iterative reconstruction technique. COMPARISON:  06/20/2022 FINDINGS: Segmentation: 5 lumbar type vertebrae. Alignment: No traumatic listhesis. Vertebrae: Diffuse osseous demineralization. Prior laminectomy on the right at L5. There is a subtle nondisplaced fracture of the anterior cortex of the sacrum at S4 (series 8, image 34). Lumbar vertebral body heights are maintained without evidence of fracture. No suspicious bone lesion. Paraspinal and other soft tissues: Aortic atherosclerosis. There is some mild wall thickening of the imaged distal sigmoid colon. Disc levels: Moderate-advanced multilevel degenerative disc disease of the lumbar spine. IMPRESSION: 1. Subtle nondisplaced fracture of the anterior  cortex of the sacrum at S4. 2. No acute fracture or traumatic listhesis of the lumbar spine. 3. Mild wall thickening of the imaged distal sigmoid colon. Correlate for signs and symptoms of colitis. 4. Aortic atherosclerosis (ICD10-I70.0). Electronically Signed   By: Duanne Guess D.O.   On: 10/21/2022 09:50   CT Cervical Spine Wo Contrast  Result Date: 10/21/2022 CLINICAL DATA:  Neck trauma.  Head trauma. EXAM: CT CERVICAL SPINE WITHOUT CONTRAST TECHNIQUE: Multidetector CT imaging of the cervical spine was performed without intravenous contrast. Multiplanar CT image reconstructions were also generated. RADIATION DOSE REDUCTION: This exam was performed according to the departmental dose-optimization program which includes automated exposure control, adjustment of the mA and/or kV according to patient size and/or use of iterative reconstruction technique. COMPARISON:  CT cervical spine 03/02/2018. FINDINGS: Alignment: Physiologic. Skull base and vertebrae: No evidence of acute cervical spine fracture or traumatic subluxation. Soft tissues and spinal canal: No prevertebral fluid or swelling. No visible canal hematoma. Disc levels: Mildly progressive multilevel cervical spondylosis, especially at C3-4 where there is progressive loss of disc height and endplate osteophyte formation. Spondylosis remains greatest at C5-6 and C6-7, not grossly changed from previous study. No large disc herniation identified. Upper chest: Clear lung apices. Other: Severe bilateral carotid atherosclerosis. IMPRESSION: 1. No evidence of acute cervical spine fracture, traumatic subluxation or static signs of instability. 2. Mildly progressive multilevel cervical spondylosis. 3. Severe bilateral carotid atherosclerosis. Electronically Signed   By: Carey Bullocks M.D.   On: 10/21/2022 09:48   DG Chest Portable 1  View  Result Date: 10/21/2022 CLINICAL DATA:  87 year old female with history of chest pain. EXAM: PORTABLE CHEST 1 VIEW  COMPARISON:  Chest x-ray 09/14/2022. FINDINGS: Lung volumes are normal. No consolidative airspace disease. No pleural effusions. No pneumothorax. No pulmonary nodule or mass noted. Pulmonary vasculature and the cardiomediastinal silhouette are within normal limits. Atherosclerosis in the thoracic aorta. IMPRESSION: 1. No radiographic evidence of acute cardiopulmonary disease. 2. Aortic atherosclerosis. Electronically Signed   By: Trudie Reed M.D.   On: 10/21/2022 08:45        Scheduled Meds:  amiodarone  200 mg Oral Daily   Chlorhexidine Gluconate Cloth  6 each Topical Daily   insulin aspart  0-6 Units Subcutaneous TID WC   lipase/protease/amylase  36,000 Units Oral BID WC   prednisoLONE acetate  1 drop Both Eyes BID   valACYclovir  500 mg Oral QHS   Continuous Infusions:  anticoagulant sodium citrate       LOS: 2 days    Time spent: 35 minutes    Madsen Riddle A Ayven Glasco, MD [facility]   If 7PM-7AM, please contact night-coverage [email or web address]  10/23/2022, 7:46 AM

## 2022-10-23 NOTE — Progress Notes (Signed)
PT Cancellation Note  Patient Details Name: Kim Burns MRN: [id number] DOB: 1935-10-11   Cancelled Treatment:    Reason Eval/Treat Not Completed: Patient at procedure or test/unavailable (Pt off unit at HD. Will follow up at later date/time as pt able and schedule allows.)   Renaldo Fiddler PT, DPT Acute Rehabilitation Services Office [phone]  10/23/22 10:46 AM

## 2022-10-23 NOTE — Progress Notes (Signed)
   10/23/22 1242  Vitals  Temp 97.8 F (36.6 C)  Pulse Rate (!) 139  Resp 18  BP (!) 156/106  SpO2 96 %  O2 Device Room Air  Weight 57.7 kg  Type of Weight Post-Dialysis  Post Treatment  Dialyzer Clearance Lightly streaked  Hemodialysis Intake (mL) 0 mL  Liters Processed 48.3  Fluid Removed (mL) 900 mL  Post-Hemodialysis Comments tx terminated due to [facility] unchanged with intervention MD aware  AVG/AVF Arterial Site Held (minutes) 10 minutes  AVG/AVF Venous Site Held (minutes) 10 minutes   Received patient in bed to unit.  Alert and oriented.  Informed consent signed and in chart.   TX duration:2.5hrs due to [facility]  Patient tolerated well.  Transported back to the room  Alert, without acute distress.  Hand-off given to patient's nurse.   Access used: LUA AVF Access issues: none  Total UF removed: Medication(s) given: none    [facility]

## 2022-10-23 NOTE — Progress Notes (Signed)
[facility] Progress Note   Subjective:  Pt. Seen in bed in dialysis bay expressing confusion and concern regarding people "doubling up" on her dialysis treatments.   Objective Vitals:   10/23/22 1200 10/23/22 1230 10/23/22 1237 10/23/22 1242  BP: (!) 147/77 (!) 153/95 (!) 142/95 (!) 156/106  Pulse: 73 (S) (!) 138 (!) 143 (!) 139  Resp: 18 20 20 18   Temp:    97.8 F (36.6 C)  TempSrc:      SpO2: 98% 96% 97% 96%  Weight:    57.7 kg  Height:       Physical Exam General: Thin-appearing elderly female lying in hospital bed.  Heart: RRR no m/r/g Lungs: Fine bibasilar crackles posteriorly.  Abdomen: Soft, non-tender, non-distended.  Extremities: Trace edema in BLE.  Dialysis Access: LUAAVF present and in good repair, + bruit/thrill and no warmth/erythema/edema.   Additional Objective Labs: Basic Metabolic Panel: Recent Labs  Lab 10/21/22 0756 10/23/22 0603 10/23/22 0845  NA 132* 133* 132*  K 3.9 5.1 3.8  CL 98 95* 95*  CO2 20* 19* 23  GLUCOSE 153* 114* 111*  BUN 40* 45* 46*  CREATININE 5.37* 4.34* 4.32*  CALCIUM 8.3* 8.1* 7.6*  PHOS  --  8.6* 8.2*   CBC: Recent Labs  Lab 10/21/22 0756 10/23/22 0845  WBC 11.3* 8.7  NEUTROABS 9.5*  --   HGB 12.1 10.6*  HCT 38.6 32.8*  MCV 99.2 94.8  PLT 175 179   Blood Culture    Component Value Date/Time   SDES BLOOD RIGHT ANTECUBITAL 01/22/2022 0038   SPECREQUEST  01/22/2022 0038    BOTTLES DRAWN AEROBIC AND ANAEROBIC Blood Culture results may not be optimal due to an inadequate volume of blood received in culture bottles   CULT  01/22/2022 0038    NO GROWTH 5 DAYS Performed at [facility], [address], [place], [place] [address]    REPTSTATUS 01/27/2022 FINAL 01/22/2022 0038    CBG: Recent Labs  Lab 10/21/22 2129 10/22/22 0723 10/22/22 1105 10/22/22 1553 10/22/22 2246  GLUCAP 107* 100* 84 101* 108*   Studies/Results: IR NEPHROSTOMY EXCHANGE LEFT  Result Date: 10/22/2022 INDICATION:  Chronic indwelling left nephrostomy catheter EXAM: FLUOROSCOPIC EXCHANGE OF THE 10 FRENCH LEFT NEPHROSTOMY COMPARISON:  08/26/2022 MEDICATIONS: 1% lidocaine local ANESTHESIA/SEDATION: None. CONTRAST:  10 cc-administered into the collecting system(s) FLUOROSCOPY: Radiation Exposure Index (as provided by the fluoroscopic device): 7.0 mGy Kerma COMPLICATIONS: None immediate. PROCEDURE: Informed written consent was obtained from the patient after a thorough discussion of the procedural risks, benefits and alternatives. All questions were addressed. Maximal Sterile Barrier Technique was utilized including caps, mask, sterile gowns, sterile gloves, sterile drape, hand hygiene and skin antiseptic. A timeout was performed prior to the initiation of the procedure. Previous imaging reviewed. Initial catheter injection performed. Nephrostomy catheter is within the renal pelvis. Successful exchange performed over a Amplatz guidewire. Retention loop reformed in the renal pelvis. Contrast injection confirms position. Images obtained for documentation. Catheter secured with a skin suture and a sterile dressing. Gravity drainage bag connected. No immediate complication. Patient tolerated the procedure well. IMPRESSION: Successful fluoroscopic 10 French left nephrostomy exchange Electronically Signed   By: Judie Petit.  Shick M.D.   On: 10/22/2022 11:15   CT HEAD WO CONTRAST ( )  Result Date: 10/22/2022 CLINICAL DATA:  Traumatic brain injury. EXAM: CT HEAD WITHOUT CONTRAST TECHNIQUE: Contiguous axial images were obtained from the base of the skull through the vertex without intravenous contrast. RADIATION DOSE REDUCTION: This exam  was performed according to the departmental dose-optimization program which includes automated exposure control, adjustment of the mA and/or kV according to patient size and/or use of iterative reconstruction technique. COMPARISON:  Yesterday FINDINGS: Brain: Right inferior frontal hematoma with rim of edema.  The hemorrhagic portion measures 2 cm in diameter. No new site of hemorrhage or progression. No infarct, hydrocephalus, or shift Vascular: Atheromatous calcification. Skull: Nondepressed left occipital bone fracture with overlying hematoma. Sinuses/Orbits: Negative IMPRESSION: No new finding or progression of the patient's right frontal hematoma. Electronically Signed   By: Tiburcio Pea M.D.   On: 10/22/2022 07:46   DG Foot 2 Views Right  Result Date: 10/21/2022 CLINICAL DATA:  Unwitnessed fall.  Right foot pain EXAM: RIGHT FOOT - 2 VIEW COMPARISON:  04/19/2021 FINDINGS: Two views of the right foot demonstrate no evidence of an acute fracture. No dislocation. Bones are demineralized. Hallux valgus deformity with mild osteoarthritis of the first MTP joint. Bidirectional calcaneal enthesophytes. Generalized soft tissue swelling. Atherosclerotic vascular calcification. IMPRESSION: Generalized soft tissue swelling without acute fracture or dislocation of the right foot. Electronically Signed   By: Duanne Guess D.O.   On: 10/21/2022 17:32   Medications:  anticoagulant sodium citrate      amiodarone  200 mg Oral Daily   Chlorhexidine Gluconate Cloth  6 each Topical Daily   insulin aspart  0-6 Units Subcutaneous TID WC   lipase/protease/amylase  36,000 Units Oral BID WC   prednisoLONE acetate  1 drop Both Eyes BID   valACYclovir  500 mg Oral QHS    Dialysis Orders: Center: [place] on MWF. Started HD Jan 2023. Access: LUAAVF Qb: 350; Qd: 525 (1.5x Qb); Time: 3.5 hr; EDW: 55 kg Dialysate: 2K/2Ca No Heparin Last dialyzed on 8/30, off-wt: 54.5kg.  Assessment/Plan: ESRD: Volume status is improved further. She was being received for HD upstairs at the time of visit and expressed concerns she was being dialyzed twice & being held against her will. I suspect this is delirium or paranoia related to her hospital stay/neuro changes +/- disorientation due to her being dialyzed last (9/2) in the Neuro  ICU and now being brought to the 6th floor HD unit. Calling her son/family seemed to help with paranoia/confusion. Electrolyte status is at baseline and not to blame for changes in mentation. Continue scheduled HD treatments, trend renal panel, monitor for changes in volume status. Continue renal diet as tolerated, 2L fluid restriction, and monitor closely while undergoing HD treatments.  Frontal Contusion: Managed by Neuro team. She is oriented and alert but is confused/wary as above. Hypertension: BP trending downward. No hydralazine since yesterday.  Anemia of CKD: Somewhat anemic. Consider iron study and supplementation if indicated.  Santiago Bumpers, PA-S2 10/23/2022, 1:33 PM  [employer]

## 2022-10-23 NOTE — Progress Notes (Signed)
OT Cancellation Note  Patient Details Name: Kim Burns MRN: [id number] DOB: 1935/09/03   Cancelled Treatment:    Reason Eval/Treat Not Completed: Patient at procedure or test/ unavailable Patient off floor at HD, OT will follow back to complete evaluation when able.   [name], OTR/L Acute Rehabilitation Services [phone]   [name] 10/23/2022, 7:58 AM

## 2022-10-23 NOTE — Consult Note (Signed)
Cardiology Consultation   Patient ID: Kim Burns MRN: [id number]; DOB: 24-Jul-1935  Admit date: 10/21/2022 Date of Consult: 10/23/2022  PCP:  Lewis Moccasin, MD   [facility] Providers Cardiologist:  Peter Swaziland, MD        Patient Profile:   Kim Burns is a 87 y.o. female with a hx of Hypertension, PAF  formerly on AC (dc after spontaneous renal hematoma), HTN, s/p whipple + (pancreatic mass, no malignancy, high grady dysplasia), RCC s/p embo/ ablation in 2023 and ESRD on hemodialysis, CAD mild to moderate tricuspid  who is being seen 10/23/2022 for the evaluation of Atrial fibrillation with rvr at the request of Dr. Sunnie Nielsen.  History of Present Illness:   Kim Burns was brought to the ED to be evaluated after she was found face down at home by her son in law. She has no memory of the actual episode and it is ambiguous if she lost consciousness.   Her work up in the ED reveals R frontal hemorrhagic contusion, non-displaced L parietal and occipital fx, L posterior scalp hematoma.  We have been consulted due to her atrial fibrillation with RVR and low blood pressure after her metoprolol was started. She was seen and examined at her bedside.   Chart review -she follows with Dr. Peter Swaziland and was last seen by him on June 06, 2022 at that time she appeared to be doing well from a cardiovascular standpoint.  They again revisited anticoagulation but due to major bleeding episode despite increase CHA2DS2-VASc score shared decision was to forego anticoagulation and continue amiodarone. In the meantime she was seen by Bernadene Person, NP on October 06, 2022 as a follow-up due to her atrial fibrillation admission in July 2024.  During that visit her amiodarone was increased to 200 mg daily.     Past Medical History:  Diagnosis Date   Anemia of chronic disease    takes iron   Anxiety    Blood transfusion without reported diagnosis    Bradycardia    Diabetes mellitus  without complication (HCC)    became diabetic after Whipple procedure   Diarrhea    Dysrhythmia 04/16/2016   bradycardia due to medication    ESRD on hemodialysis [facility])    M-W-F   GERD (gastroesophageal reflux disease)    Gout    Headache    History of kidney stones 06/2013   Hyperparathyroidism (HCC)    Hypertension    PONV (postoperative nausea and vomiting)    Sleep apnea    no cpap machine. could not tolerate   Stroke (HCC) 04/16/2016   TIA 1995   Vitamin D deficiency     Past Surgical History:  Procedure Laterality Date   A/V FISTULAGRAM Left 08/30/2021   Procedure: A/V Fistulagram;  Surgeon: Cephus Shelling, MD;  Location: [facility] INVASIVE CV LAB;  Service: Cardiovascular;  Laterality: Left;   ABDOMINAL HYSTERECTOMY  1985   complete   BACK SURGERY  1980   lower   BASCILIC VEIN TRANSPOSITION Left 04/24/2017   Procedure: LEFT ARM FIRST STAGE BASILIC VEIN TRANSPOSITION;  Surgeon: Nada Libman, MD;  Location: [facility] OR;  Service: Vascular;  Laterality: Left;   BASCILIC VEIN TRANSPOSITION Left 07/10/2017   Procedure: SECOND STAGE BASILIC VEIN TRANSPOSITION LEFT ARM;  Surgeon: Nada Libman, MD;  Location: [facility] OR;  Service: Vascular;  Laterality: Left;   BREAST LUMPECTOMY Left x 2   many years apart, benign   CHOLECYSTECTOMY  1985  CYSTOSCOPY W/ URETERAL STENT PLACEMENT Left 10/31/2021   Procedure: CYSTOSCOPY WITH RETROGRADE PYELOGRAM/URETERAL STENT PLACEMENT;  Surgeon: Crist Fat, MD;  Location: [facility] OR;  Service: Urology;  Laterality: Left;   CYSTOSCOPY WITH URETEROSCOPY AND STENT PLACEMENT Left 06/18/2013   Procedure: CYSTOSCOPY WITH Lef URETEROSCOPY AND Left STENT PLACEMENT;  Surgeon: Crecencio Mc, MD;  Location: [facility] ORS;  Service: Urology;  Laterality: Left;   ESOPHAGOGASTRODUODENOSCOPY (EGD) WITH PROPOFOL N/A 11/13/2016   Procedure: ESOPHAGOGASTRODUODENOSCOPY (EGD) WITH PROPOFOL;  Surgeon: Kathi Der, MD;  Location: [facility] ENDOSCOPY;  Service: Gastroenterology;   Laterality: N/A;   EUS N/A 02/07/2016   Procedure: ESOPHAGEAL ENDOSCOPIC ULTRASOUND (EUS) RADIAL;  Surgeon: Willis Modena, MD;  Location: [facility] ENDOSCOPY;  Service: Endoscopy;  Laterality: N/A;   EYE SURGERY Bilateral 2014   ioc for catracts    FOOT SURGERY Left 1990   something with toes unsure what    HERNIA REPAIR  2018   IR CATHETER TUBE CHANGE  03/14/2022   IR EMBO TUMOR ORGAN ISCHEMIA INFARCT INC GUIDE ROADMAPPING  08/22/2021   IR NEPHROSTOMY EXCHANGE LEFT  06/27/2022   IR NEPHROSTOMY EXCHANGE LEFT  08/26/2022   IR NEPHROSTOMY EXCHANGE LEFT  10/22/2022   IR NEPHROSTOMY PLACEMENT LEFT  03/14/2022   IR RADIOLOGIST EVAL & MGMT  05/18/2021   IR RADIOLOGIST EVAL & MGMT  10/01/2021   IR RADIOLOGIST EVAL & MGMT  11/12/2021   IR RADIOLOGIST EVAL & MGMT  11/22/2021   IR RADIOLOGIST EVAL & MGMT  11/27/2021   IR RADIOLOGIST EVAL & MGMT  12/20/2021   IR RENAL SUPRASEL UNI S&I MOD SED  08/22/2021   IR US GUIDE VASC ACCESS RIGHT  08/22/2021   LEFT HEART CATH AND CORONARY ANGIOGRAPHY N/A 08/27/2021   Procedure: LEFT HEART CATH AND CORONARY ANGIOGRAPHY;  Surgeon: Kathleene Hazel, MD;  Location: [facility] INVASIVE CV LAB;  Service: Cardiovascular;  Laterality: N/A;   PARATHYROID EXPLORATION     PERIPHERAL VASCULAR BALLOON ANGIOPLASTY Left 08/30/2021   Procedure: PERIPHERAL VASCULAR BALLOON ANGIOPLASTY;  Surgeon: Cephus Shelling, MD;  Location: [facility] INVASIVE CV LAB;  Service: Cardiovascular;  Laterality: Left;  arm fistula   RADIOLOGY WITH ANESTHESIA Left 08/22/2021   Procedure: MICROWAVE ABLATION;  Surgeon: Simonne Come, MD;  Location: [facility] ORS;  Service: Anesthesiology;  Laterality: Left;   WHIPPLE PROCEDURE N/A 04/23/2016   Procedure: WHIPPLE PROCEDURE;  Surgeon: Almond Lint, MD;  Location: [facility] OR;  Service: General;  Laterality: N/A;      Inpatient Medications: Scheduled Meds:  amiodarone  200 mg Oral Daily   Chlorhexidine Gluconate Cloth  6 each Topical Daily   insulin aspart  0-6 Units Subcutaneous TID WC    lipase/protease/amylase  36,000 Units Oral BID WC   metoprolol tartrate  25 mg Oral BID   prednisoLONE acetate  1 drop Both Eyes BID   sevelamer carbonate  2,400 mg Oral TID WC   valACYclovir  500 mg Oral QHS   Continuous Infusions:  PRN Meds: acetaminophen, cycloSPORINE, docusate sodium, hydrALAZINE, metoprolol tartrate, olopatadine, ondansetron (ZOFRAN) IV, mouth rinse, polyethylene glycol, rOPINIRole, traMADol  Allergies:    Allergies  Allergen Reactions   Lopid [Gemfibrozil] Other (See Comments)    Myalgia    Lotemax [Loteprednol Etabonate] Rash    Skin burning  Blurry vision   Statins Other (See Comments)    Rosuvastatin Muscle weakness   Norco [Hydrocodone-Acetaminophen] Other (See Comments)    Headaches  Tolerates acetaminophen    Other Diarrhea    Real Butter - diarrhea  Vibra-Tab [Doxycycline] Other (See Comments)    Unknown reaction   Amlodipine Other (See Comments)    Norvasc   Codeine Other (See Comments)    headache   Hydrocodone Other (See Comments)   Hydrocodone-Acetaminophen Other (See Comments)    Headache. Tolerates acetaminophen.   Lisinopril Other (See Comments)   Verapamil Other (See Comments)    Social History:   Social History   Socioeconomic History   Marital status: Widowed    Spouse name: Not on file   Number of children: Not on file   Years of education: Not on file   Highest education level: Not on file  Occupational History   Not on file  Tobacco Use   Smoking status: Never   Smokeless tobacco: Never  Vaping Use   Vaping status: Never Used  Substance and Sexual Activity   Alcohol use: No   Drug use: No   Sexual activity: Not on file  Other Topics Concern   Not on file  Social History Narrative   Not on file   Social Determinants of Health   Financial Resource Strain: Not on file  Food Insecurity: No Food Insecurity (11/01/2021)   Hunger Vital Sign    Worried About Running Out of Food in the Last Year: Never true     Ran Out of Food in the Last Year: Never true  Transportation Needs: No Transportation Needs (11/01/2021)   PRAPARE - Administrator, Civil Service (Medical): No    Lack of Transportation (Non-Medical): No  Physical Activity: Not on file  Stress: Not on file  Social Connections: Not on file  Intimate Partner Violence: Not At Risk (11/01/2021)   Humiliation, Afraid, Rape, and Kick questionnaire    Fear of Current or Ex-Partner: No    Emotionally Abused: No    Physically Abused: No    Sexually Abused: No    Family History:    Family History  Problem Relation Age of Onset   Heart disease Mother    Stroke Mother    Cancer Father        colon   Alzheimer's disease Sister    Alzheimer's disease Sister    Cancer Brother        brain   Breast cancer Neg Hx      ROS:  Please see the history of present illness.   All other ROS reviewed and negative.     Physical Exam/Data:   Vitals:   10/23/22 1242 10/23/22 1500 10/23/22 1600 10/23/22 1621  BP: (!) 156/106 (!) 134/102  127/79  Pulse: (!) 139 (!) 153  (!) 130  Resp: 18 18  19   Temp: 97.8 F (36.6 C)  98.3 F (36.8 C)   TempSrc:   Oral   SpO2: 96% (!) 89%  97%  Weight: 57.7 kg     Height:        Intake/Output Summary (Last 24 hours) at 10/23/2022 1724 Last data filed at 10/23/2022 1242 Gross per 24 hour  Intake 600 ml  Output 920 ml  Net -320 ml      10/23/2022   12:42 PM 10/23/2022    8:17 AM 10/23/2022    5:00 AM  Last 3 Weights  Weight (lbs) 127 lb 3.3 oz 135 lb 5.8 oz 127 lb 6.8 oz  Weight (kg) 57.7 kg 61.4 kg 57.8 kg     Body mass index is 22.53 kg/m.  General:  Well nourished, well developed, in no acute distress HEENT: normal  Neck: no JVD Vascular: No carotid bruits; Distal pulses 2+ bilaterally Cardiac:  normal S1, S2; RRR; no murmur  Lungs:  clear to auscultation bilaterally, no wheezing, rhonchi or rales  Abd: soft, nontender, no hepatomegaly  Ext: no edema Musculoskeletal:  No deformities, BUE  and BLE strength normal and equal Skin: warm and dry  Neuro:  CNs 2-12 intact, no focal abnormalities noted Psych:  Normal affect   EKG:  The EKG was personally reviewed and demonstrates:   Telemetry:  Telemetry was personally reviewed and demonstrates:  afib with rapid ventricular rate  Relevant CV Studies:  TTE 08/20/2022 IMPRESSIONS   1. Left ventricular ejection fraction, by estimation, is 55 to 60%. The  left ventricle has normal function. The left ventricle has no regional  wall motion abnormalities. Left ventricular diastolic parameters are  indeterminate.   2. Right ventricular systolic function is mildly reduced. The right  ventricular size is normal. There is severely elevated pulmonary artery  systolic pressure.   3. Left atrial size was severely dilated.   4. Right atrial size was mild to moderately dilated.   5. The mitral valve is degenerative. Mild mitral valve regurgitation.  Moderate mitral annular calcification.   6. Tricuspid valve regurgitation is moderate.   7. The aortic valve is tricuspid. Aortic valve regurgitation is not  visualized.   8. The inferior vena cava is normal in size with greater than 50%  respiratory variability, suggesting right atrial pressure of 3 mmHg.   FINDINGS   Left Ventricle: Left ventricular ejection fraction, by estimation, is 55  to 60%. The left ventricle has normal function. The left ventricle has no  regional wall motion abnormalities. The left ventricular internal cavity  size was normal in size. There is   no left ventricular hypertrophy. Left ventricular diastolic parameters  are indeterminate.   Right Ventricle: The right ventricular size is normal. Right ventricular  systolic function is mildly reduced. There is severely elevated pulmonary  artery systolic pressure. The tricuspid regurgitant velocity is 3.90 m/s,  and with an assumed right atrial  pressure of 3 mmHg, the estimated right ventricular systolic pressure is   63.8 mmHg.   Left Atrium: Left atrial size was severely dilated.   Right Atrium: Right atrial size was mild to moderately dilated.   Pericardium: There is no evidence of pericardial effusion.   Mitral Valve: The mitral valve is degenerative in appearance. Moderate  mitral annular calcification. Mild mitral valve regurgitation.   Tricuspid Valve: The tricuspid valve is normal in structure. Tricuspid  valve regurgitation is moderate.   Aortic Valve: The aortic valve is tricuspid. Aortic valve regurgitation is  not visualized.   Pulmonic Valve: The pulmonic valve was normal in structure. Pulmonic valve  regurgitation is not visualized.   Aorta: The aortic root and ascending aorta are structurally normal, with  no evidence of dilitation.   Venous: The inferior vena cava is normal in size with greater than 50%  respiratory variability, suggesting right atrial pressure of 3 mmHg.   IAS/Shunts: No atrial level shunt detected by color flow Doppler.    Laboratory Data:  High Sensitivity Troponin:   Recent Labs  Lab 10/21/22 0756 10/21/22 1036  TROPONINIHS 16 16     Chemistry Recent Labs  Lab 10/21/22 0756 10/23/22 0603 10/23/22 0845  NA 132* 133* 132*  K 3.9 5.1 3.8  CL 98 95* 95*  CO2 20* 19* 23  GLUCOSE 153* 114* 111*  BUN 40* 45* 46*  CREATININE 5.37*  4.34* 4.32*  CALCIUM 8.3* 8.1* 7.6*  GFRNONAA 7* 9* 10*  ANIONGAP 14 19* 14    No results for input(s): "PROT", "ALBUMIN", "AST", "ALT", "ALKPHOS", "BILITOT" in the last 168 hours. Lipids No results for input(s): "CHOL", "TRIG", "HDL", "LABVLDL", "LDLCALC", "CHOLHDL" in the last 168 hours.  Hematology Recent Labs  Lab 10/21/22 0756 10/23/22 0845  WBC 11.3* 8.7  RBC 3.89 3.46*  HGB 12.1 10.6*  HCT 38.6 32.8*  MCV 99.2 94.8  MCH 31.1 30.6  MCHC 31.3 32.3  RDW 18.0* 18.1*  PLT 175 179   Thyroid No results for input(s): "TSH", "FREET4" in the last 168 hours.  BNPNo results for input(s): "BNP", "PROBNP" in  the last 168 hours.  DDimer No results for input(s): "DDIMER" in the last 168 hours.   Radiology/Studies:  IR NEPHROSTOMY EXCHANGE LEFT  Result Date: 10/22/2022 INDICATION: Chronic indwelling left nephrostomy catheter EXAM: FLUOROSCOPIC EXCHANGE OF THE 10 FRENCH LEFT NEPHROSTOMY COMPARISON:  08/26/2022 MEDICATIONS: 1% lidocaine local ANESTHESIA/SEDATION: None. CONTRAST:  10 cc-administered into the collecting system(s) FLUOROSCOPY: Radiation Exposure Index (as provided by the fluoroscopic device): 7.0 mGy Kerma COMPLICATIONS: None immediate. PROCEDURE: Informed written consent was obtained from the patient after a thorough discussion of the procedural risks, benefits and alternatives. All questions were addressed. Maximal Sterile Barrier Technique was utilized including caps, mask, sterile gowns, sterile gloves, sterile drape, hand hygiene and skin antiseptic. A timeout was performed prior to the initiation of the procedure. Previous imaging reviewed. Initial catheter injection performed. Nephrostomy catheter is within the renal pelvis. Successful exchange performed over a Amplatz guidewire. Retention loop reformed in the renal pelvis. Contrast injection confirms position. Images obtained for documentation. Catheter secured with a skin suture and a sterile dressing. Gravity drainage bag connected. No immediate complication. Patient tolerated the procedure well. IMPRESSION: Successful fluoroscopic 10 French left nephrostomy exchange Electronically Signed   By: Judie Petit.  Shick M.D.   On: 10/22/2022 11:15   CT HEAD WO CONTRAST ( )  Result Date: 10/22/2022 CLINICAL DATA:  Traumatic brain injury. EXAM: CT HEAD WITHOUT CONTRAST TECHNIQUE: Contiguous axial images were obtained from the base of the skull through the vertex without intravenous contrast. RADIATION DOSE REDUCTION: This exam was performed according to the departmental dose-optimization program which includes automated exposure control, adjustment of the mA  and/or kV according to patient size and/or use of iterative reconstruction technique. COMPARISON:  Yesterday FINDINGS: Brain: Right inferior frontal hematoma with rim of edema. The hemorrhagic portion measures 2 cm in diameter. No new site of hemorrhage or progression. No infarct, hydrocephalus, or shift Vascular: Atheromatous calcification. Skull: Nondepressed left occipital bone fracture with overlying hematoma. Sinuses/Orbits: Negative IMPRESSION: No new finding or progression of the patient's right frontal hematoma. Electronically Signed   By: Tiburcio Pea M.D.   On: 10/22/2022 07:46   DG Foot 2 Views Right  Result Date: 10/21/2022 CLINICAL DATA:  Unwitnessed fall.  Right foot pain EXAM: RIGHT FOOT - 2 VIEW COMPARISON:  04/19/2021 FINDINGS: Two views of the right foot demonstrate no evidence of an acute fracture. No dislocation. Bones are demineralized. Hallux valgus deformity with mild osteoarthritis of the first MTP joint. Bidirectional calcaneal enthesophytes. Generalized soft tissue swelling. Atherosclerotic vascular calcification. IMPRESSION: Generalized soft tissue swelling without acute fracture or dislocation of the right foot. Electronically Signed   By: Duanne Guess D.O.   On: 10/21/2022 17:32   CT Head Wo Contrast  Addendum Date: 10/21/2022   ADDENDUM REPORT: 10/21/2022 10:13 ADDENDUM: Study discussed by telephone with Dr. Shawna Orleans  BELFI on 10/21/2022 at 0956 hours. Electronically Signed   By: Odessa Fleming M.D.   On: 10/21/2022 10:13   Result Date: 10/21/2022 CLINICAL DATA:  87 year old female dialysis patient with syncope, fall. Found down. EXAM: CT HEAD WITHOUT CONTRAST TECHNIQUE: Contiguous axial images were obtained from the base of the skull through the vertex without intravenous contrast. RADIATION DOSE REDUCTION: This exam was performed according to the departmental dose-optimization program which includes automated exposure control, adjustment of the mA and/or kV according to patient  size and/or use of iterative reconstruction technique. COMPARISON:  Head CT 01/31/2022.  More recent brain MRI 09/14/2022. FINDINGS: Brain: Right anterior frontal lobe, inferior frontal gyrus hemorrhagic contusion is rounded, up to 2 cm diameter. Mild adjacent edema. No significant regional mass effect. No convincing subarachnoid or subdural blood. No IVH. No ventriculomegaly. No midline shift. Basilar cisterns remain normal. Outside the area of acute parenchymal contusion the gray-white matter differentiation is stable. Vascular: Calcified atherosclerosis at the skull base. No suspicious intracranial vascular hyperdensity. Skull: Nondisplaced left parietal and occipital bone fracture. Sinuses/Orbits: Visualized paranasal sinuses and mastoids are stable and well aerated. Other: Leftward gaze. No acute orbit or scalp *CRASH* that broad-based left posterior scalp hematoma measures up to 12 mm thickness at the occiput put and there is an underlying nondisplaced left occipital bone fracture tracking toward but not directly involving the foramen magnum (series 4, image 20). Hematoma and the fracture tracks cephalad before dissipating. Traumatic Brain Injury Risk Stratification Skull Fracture: Nondisplaced - Moderate/mBIG 2 Subdural Hematoma (SDH): No - Low Subarachnoid Hemorrhage [facility]): No Epidural Hematoma (EDH): No - Low / mBIG 1 Cerebral contusion, intra-axial, intraparenchymal Hemorrhage (IPH): 8mm plus or multiple - High/mBIG 3 Intraventricular Hemorrhage (IVH): No - Low / mBIG 1 Midline Shift > 1mm or Edema/effacement of sulci/vents: No - Low / mBIG 1 ---------------------------------------------------- IMPRESSION: 1. Positive for acute coup contrecoup brain injury with nondisplaced left posterior skull fracture, right anterior frontal lobe hemorrhagic contusion. No midline shift or other complicating features. 2. Associated left posterior scalp hematoma. Electronically Signed: By: Odessa Fleming M.D. On: 10/21/2022 09:53    CT Lumbar Spine Wo Contrast  Result Date: 10/21/2022 CLINICAL DATA:  Back trauma, no prior imaging (Age >= 16y).  Pain EXAM: CT LUMBAR SPINE WITHOUT CONTRAST TECHNIQUE: Multidetector CT imaging of the lumbar spine was performed without intravenous contrast administration. Multiplanar CT image reconstructions were also generated. RADIATION DOSE REDUCTION: This exam was performed according to the departmental dose-optimization program which includes automated exposure control, adjustment of the mA and/or kV according to patient size and/or use of iterative reconstruction technique. COMPARISON:  06/20/2022 FINDINGS: Segmentation: 5 lumbar type vertebrae. Alignment: No traumatic listhesis. Vertebrae: Diffuse osseous demineralization. Prior laminectomy on the right at L5. There is a subtle nondisplaced fracture of the anterior cortex of the sacrum at S4 (series 8, image 34). Lumbar vertebral body heights are maintained without evidence of fracture. No suspicious bone lesion. Paraspinal and other soft tissues: Aortic atherosclerosis. There is some mild wall thickening of the imaged distal sigmoid colon. Disc levels: Moderate-advanced multilevel degenerative disc disease of the lumbar spine. IMPRESSION: 1. Subtle nondisplaced fracture of the anterior cortex of the sacrum at S4. 2. No acute fracture or traumatic listhesis of the lumbar spine. 3. Mild wall thickening of the imaged distal sigmoid colon. Correlate for signs and symptoms of colitis. 4. Aortic atherosclerosis (ICD10-I70.0). Electronically Signed   By: Duanne Guess D.O.   On: 10/21/2022 09:50   CT Cervical Spine Wo Contrast  Result Date: 10/21/2022 CLINICAL DATA:  Neck trauma.  Head trauma. EXAM: CT CERVICAL SPINE WITHOUT CONTRAST TECHNIQUE: Multidetector CT imaging of the cervical spine was performed without intravenous contrast. Multiplanar CT image reconstructions were also generated. RADIATION DOSE REDUCTION: This exam was performed according to  the departmental dose-optimization program which includes automated exposure control, adjustment of the mA and/or kV according to patient size and/or use of iterative reconstruction technique. COMPARISON:  CT cervical spine 03/02/2018. FINDINGS: Alignment: Physiologic. Skull base and vertebrae: No evidence of acute cervical spine fracture or traumatic subluxation. Soft tissues and spinal canal: No prevertebral fluid or swelling. No visible canal hematoma. Disc levels: Mildly progressive multilevel cervical spondylosis, especially at C3-4 where there is progressive loss of disc height and endplate osteophyte formation. Spondylosis remains greatest at C5-6 and C6-7, not grossly changed from previous study. No large disc herniation identified. Upper chest: Clear lung apices. Other: Severe bilateral carotid atherosclerosis. IMPRESSION: 1. No evidence of acute cervical spine fracture, traumatic subluxation or static signs of instability. 2. Mildly progressive multilevel cervical spondylosis. 3. Severe bilateral carotid atherosclerosis. Electronically Signed   By: Carey Bullocks M.D.   On: 10/21/2022 09:48   DG Chest Portable 1 View  Result Date: 10/21/2022 CLINICAL DATA:  87 year old female with history of chest pain. EXAM: PORTABLE CHEST 1 VIEW COMPARISON:  Chest x-ray 09/14/2022. FINDINGS: Lung volumes are normal. No consolidative airspace disease. No pleural effusions. No pneumothorax. No pulmonary nodule or mass noted. Pulmonary vasculature and the cardiomediastinal silhouette are within normal limits. Atherosclerosis in the thoracic aorta. IMPRESSION: 1. No radiographic evidence of acute cardiopulmonary disease. 2. Aortic atherosclerosis. Electronically Signed   By: Trudie Reed M.D.   On: 10/21/2022 08:45     Assessment and Plan:   Persistent atrial fibrillation  Nonobstructive CAD Hypertension - transient hypotension after beta blocker now resolved ESRD  Type 2 Diabetes Mellitus  She is in A-fib  RVR.  Has not responded well to her current dose of amiodarone.  Will stop the amiodarone dose orally and start the patient on amiodarone drip hoping this will help.  She did not tolerate the metoprolol due to hypotension.  This is also in the setting of postdialysis and not receiving her midodrine.  Will hold her metoprolol for now and reassess tomorrow.  She is not a candidate for anticoagulation this has been established previously with her primary cardiologist this has been a shared decision.  Will not start the patient on anticoagulation at this time.  No anginal symptoms reported.  Schedule HD per primary team  DM - per primary team    Risk Assessment/Risk Scores:          CHA2DS2-VASc Score = 7   This indicates a 11.2% annual risk of stroke. The patient's score is based upon: CHF History: 0 HTN History: 1 Diabetes History: 1 Stroke History: 2 Vascular Disease History: 0 Age Score: 2 Gender Score: 1       For questions or updates, please contact  [facility] Please consult [email or web address] for contact info under    SignedThomasene Ripple, DO  10/23/2022 5:24 PM

## 2022-10-24 ENCOUNTER — Inpatient Hospital Stay (HOSPITAL_COMMUNITY): Payer: Medicare PPO

## 2022-10-24 DIAGNOSIS — I480 Paroxysmal atrial fibrillation: Secondary | ICD-10-CM | POA: Diagnosis not present

## 2022-10-24 DIAGNOSIS — S0631AA Contusion and laceration of right cerebrum with loss of consciousness status unknown, initial encounter: Secondary | ICD-10-CM | POA: Diagnosis not present

## 2022-10-24 LAB — BASIC METABOLIC PANEL
Anion gap: 17 — ABNORMAL HIGH (ref 5–15)
BUN: 32 mg/dL — ABNORMAL HIGH (ref 8–23)
CO2: 23 mmol/L (ref 22–32)
Calcium: 8.2 mg/dL — ABNORMAL LOW (ref 8.9–10.3)
Chloride: 90 mmol/L — ABNORMAL LOW (ref 98–111)
Creatinine, Ser: 3.59 mg/dL — ABNORMAL HIGH (ref 0.44–1.00)
GFR, Estimated: 12 mL/min — ABNORMAL LOW (ref >60)
Glucose, Bld: 145 mg/dL — ABNORMAL HIGH (ref 70–99)
Potassium: 3.8 mmol/L (ref 3.5–5.1)
Sodium: 130 mmol/L — ABNORMAL LOW (ref 135–145)

## 2022-10-24 LAB — CBC
HCT: 40.4 % (ref 36.0–46.0)
Hemoglobin: 13 g/dL (ref 12.0–15.0)
MCH: 31 pg (ref 26.0–34.0)
MCHC: 32.2 g/dL (ref 30.0–36.0)
MCV: 96.4 fL (ref 80.0–100.0)
Platelets: 195 10*3/uL (ref 150–400)
RBC: 4.19 MIL/uL (ref 3.87–5.11)
RDW: 17.9 % — ABNORMAL HIGH (ref 11.5–15.5)
WBC: 7.7 10*3/uL (ref 4.0–10.5)
nRBC: 0 % (ref 0.0–0.2)

## 2022-10-24 LAB — GLUCOSE, CAPILLARY
Glucose-Capillary: 104 mg/dL — ABNORMAL HIGH (ref 70–99)
Glucose-Capillary: 116 mg/dL — ABNORMAL HIGH (ref 70–99)
Glucose-Capillary: 159 mg/dL — ABNORMAL HIGH (ref 70–99)
Glucose-Capillary: 165 mg/dL — ABNORMAL HIGH (ref 70–99)

## 2022-10-24 MED ORDER — ROPINIROLE HCL 0.25 MG PO TABS: 0.2500 mg | ORAL_TABLET | Freq: Every day | ORAL | Status: DC | PRN

## 2022-10-24 MED ORDER — CHLORHEXIDINE GLUCONATE CLOTH 2 % EX PADS
6.0000 | MEDICATED_PAD | Freq: Every day | CUTANEOUS | Status: DC
  Administered 2022-10-25 – 2022-10-27 (×3): 6 via TOPICAL

## 2022-10-24 MED ORDER — ROPINIROLE HCL 0.5 MG PO TABS
0.5000 mg | ORAL_TABLET | Freq: Every day | ORAL | Status: DC
  Administered 2022-10-24 – 2022-10-29 (×6): 0.5 mg via ORAL
  Filled 2022-10-24 (×7): qty 1

## 2022-10-24 MED ORDER — ROPINIROLE HCL 0.25 MG PO TABS
0.2500 mg | ORAL_TABLET | Freq: Every day | ORAL | Status: DC | PRN
  Administered 2022-10-26 – 2022-10-30 (×5): 0.25 mg via ORAL
  Filled 2022-10-24 (×11): qty 1

## 2022-10-24 MED ORDER — ACETAMINOPHEN 500 MG PO TABS
500.0000 mg | ORAL_TABLET | Freq: Once | ORAL | Status: AC
  Administered 2022-10-24: 500 mg via ORAL
  Filled 2022-10-24: qty 1

## 2022-10-24 MED ORDER — TRAMADOL HCL 50 MG PO TABS: 25.0000 mg | ORAL_TABLET | Freq: Two times a day (BID) | ORAL | Status: DC | PRN

## 2022-10-24 NOTE — Progress Notes (Signed)
  [facility] Progress Note   Subjective:   Patient seen in room. No /co's today.   Objective Vitals:   10/24/22 0351 10/24/22 0400 10/24/22 0726 10/24/22 1108  BP: 125/84  126/81 131/63  Pulse: (!) 109  (!) 117 71  Resp: 19  17 17   Temp:  98.1 F (36.7 C) 98.6 F (37 C) 98.4 F (36.9 C)  TempSrc:   Oral Oral  SpO2: 97%  97% 100%  Weight:      Height:       Physical Exam General: Frail-appearing elderly female lying supine on hospital bed.  Heart:RRR, no M/R/G Lungs: Fine bibasilar crackles posteriorly.  Abdomen: soft, non-tender, non-distended.  Extremities: Trace edema in RLE, 1+ edema in LLE. No sores or wounds.  Dialysis Access: LUA AVF present in good repair with bruit and thrill. No erythema/edema/warmth to palpation.    OP HD:  [facility] MWF   3.5h   55kg   350/1.5   2/2 bath  L AVF  Heparin none - last dialyzed on 8/30, post wt: 54.5kg.   Assessment/Plan:  # Frontal Contusion: Managed by neurosurgery. F/u CT was normal. She seems to be improved.   # ESRD - MWF HD. Cont MWF here as allowed. She was reluctant to do HD yest am but after speaking w/ her son on the phone she agreed and did fine on HD. Next HD tomorrow  # Hypertension: BP is improved since admit. She is being treated with hydralazine at present. Trend BP and monitor for hypotension when undergoing HD.  # Anemia of CKD: Trend CBCs. Doing well at present.   #  Hyperglycemia: Managed by CC team. On sliding scale.    Vinson Moselle MD  [facility] 10/24/2022, 12:26 PM

## 2022-10-24 NOTE — Progress Notes (Signed)
Providing Compassionate, Quality Care - Together   Subjective: Patient reports frustration with all the people "coming and going and asking the same questions over and over."  Objective: Vital signs in last 24 hours: Temp:  [97.8 F (36.6 C)-100 F (37.8 C)] 98.4 F (36.9 C) (09/05 1108) Pulse Rate:  [71-153] 71 (09/05 1108) Resp:  [11-23] 17 (09/05 1108) BP: (90-156)/(62-106) 131/63 (09/05 1108) SpO2:  [89 %-100 %] 100 % (09/05 1108) Weight:  [57.7 kg] 57.7 kg (09/04 1242)  Intake/Output from previous day: 09/04 0701 - 09/05 0700 In: 413 [P.O.:80; I.V.:333] Out: 925 [Urine:25] Intake/Output this shift: No intake/output data recorded.  Alert and oriented to person, place, and situation PERRLA EOM intact Speech clear CN II-XII grossly intact MAE, Sensation intact, generalized weakness   Lab Results: Recent Labs    10/23/22 0845  WBC 8.7  HGB 10.6*  HCT 32.8*  PLT 179   BMET Recent Labs    10/23/22 0603 10/23/22 0845  NA 133* 132*  K 5.1 3.8  CL 95* 95*  CO2 19* 23  GLUCOSE 114* 111*  BUN 45* 46*  CREATININE 4.34* 4.32*  CALCIUM 8.1* 7.6*    Studies/Results: CT HEAD WO CONTRAST ( )  Addendum Date: 10/24/2022   ADDENDUM REPORT: 10/24/2022 10:51 ADDENDUM: Study discussed by telephone with Dr. Hartley Barefoot on 10/24/2022 at 1039 hours. Electronically Signed   By: Odessa Fleming M.D.   On: 10/24/2022 10:51   Result Date: 10/24/2022 CLINICAL DATA:  87 year old female with traumatic brain injury, right inferior frontal gyrus hemorrhagic contusion. Altered mental status. EXAM: CT HEAD WITHOUT CONTRAST TECHNIQUE: Contiguous axial images were obtained from the base of the skull through the vertex without intravenous contrast. RADIATION DOSE REDUCTION: This exam was performed according to the departmental dose-optimization program which includes automated exposure control, adjustment of the mA and/or kV according to patient size and/or use of iterative reconstruction  technique. COMPARISON:  10/22/2022 and earlier. FINDINGS: Brain: Right inferior frontal gyrus rounded hemorrhagic contusion with regional hypodense edema is stable since presentation on 10/21/2022. No significant regional mass effect. Basilar cisterns remain patent. However, there is a small new mixed density, low-density but not entirely CSF, left side subdural collection since 10/21/2022 measuring up to the 5 mm thickness along the left superior convexity on coronal image 41. And there is a smaller, subtle new right subdural collection since that time, about 2 mm. Trace new rightward midline shift, new since 10/22/2022. No other new intracranial hemorrhage. No IVH. No ventriculomegaly. Stable gray-white matter differentiation throughout the brain. No cortically based acute infarct identified. Vascular: Calcified atherosclerosis at the skull base. No suspicious intracranial vascular hyperdensity. Skull: Stable.  Nondisplaced left occipital bone fracture. Sinuses/Orbits: Visualized paranasal sinuses and mastoids are stable and well aerated. Other: Posterior convexity mildly lobulated scalp hematoma has not significantly changed. Orbits soft tissues appears stable and negative. IMPRESSION: 1. Small new Left > right Subdural Hematomas have developed since 10/21/2022, up to 5 mm on the left and 2 mm on the right. Trace new rightward midline shift, no other significant intracranial mass effect. 2. Right anterior frontal lobe hemorrhagic contusion remains stable. And no other new intracranial abnormality. 3. Nondisplaced left occipital bone fracture. Posterior scalp hematoma. Electronically Signed: By: Odessa Fleming M.D. On: 10/24/2022 10:37    Assessment/Plan: Patient suffered a fall on 10/21/2022. She sustained a frontal contusion. She developed an increase in confusion on 10/23/2022. Follow up imaging revealed stable right frontal hemorrhagic contusion and bilateral hygromas.   LOS:  3 days   -No surgical intervention  recommended -OK to start Lovenox   Val Eagle, DNP, AGNP-C Nurse Practitioner  [facility] [address], [place], [place] [address] P: [phone]    F: [phone]  10/24/2022, 11:35 AM

## 2022-10-24 NOTE — Progress Notes (Signed)
Progress Note  Patient Name: Kim Burns Date of Encounter: 10/24/2022  Primary Cardiologist: Peter Swazil[place], MD   Subjective   Patient seen and examined at her bedside. Back from CT   Inpatient Medications    Scheduled Meds:  Chlorhexidine Gluconate Cloth  6 each Topical Daily   insulin aspart  0-6 Units Subcutaneous TID WC   lipase/protease/amylase  36,000 Units Oral BID WC   metoprolol tartrate  12.5 mg Oral BID   prednisoLONE acetate  1 drop Both Eyes BID   sevelamer carbonate  2,400 mg Oral TID WC   valACYclovir  500 mg Oral QHS   Continuous Infusions:  amiodarone 30 mg/hr (10/24/22 0622)   PRN Meds: acetaminophen, cycloSPORINE, docusate sodium, hydrALAZINE, olopatadine, ondansetron (ZOFRAN) IV, mouth rinse, polyethylene glycol, rOPINIRole, traMADol   Vital Signs    Vitals:   10/24/22 0308 10/24/22 0351 10/24/22 0400 10/24/22 0726  BP:  125/84  126/81  Pulse: 92 (!) 109  (!) 117  Resp: 17 19  17   Temp:   98.1 F (36.7 C) 98.6 F (37 C)  TempSrc:    Oral  SpO2: 96% 97%  97%  Weight:      Height:        Intake/Output Summary (Last 24 hours) at 10/24/2022 0931 Last data filed at 10/24/2022 7253 Gross per 24 hour  Intake 412.96 ml  Output 925 ml  Net -512.04 ml   Filed Weights   10/23/22 0500 10/23/22 0817 10/23/22 1242  Weight: 57.8 kg 61.4 kg 57.7 kg    Telemetry    Sinus rhythm-  Personally Reviewed  ECG    None today  - Personally Reviewed  Physical Exam    General: Comfortable Head: Atraumatic, normal size  Eyes: PEERLA, EOMI  Neck: Supple, normal JVD Cardiac: Normal S1, S2; RRR; no murmurs, rubs, or gallops Lungs: Clear to auscultation bilaterally Abd: Soft, nontender, no hepatomegaly  Ext: warm, no edema Musculoskeletal: No deformities, BUE and BLE strength normal and equal Skin: Warm and dry, no rashes   Neuro: Alert and oriented to person, place, time, and situation, CNII-XII grossly intact, no focal deficits  Psych: Normal mood  and affect   Labs    Chemistry Recent Labs  Lab 10/21/22 0756 10/23/22 0603 10/23/22 0845  NA 132* 133* 132*  K 3.9 5.1 3.8  CL 98 95* 95*  CO2 20* 19* 23  GLUCOSE 153* 114* 111*  BUN 40* 45* 46*  CREATININE 5.37* 4.34* 4.32*  CALCIUM 8.3* 8.1* 7.6*  GFRNONAA 7* 9* 10*  ANIONGAP 14 19* 14     Hematology Recent Labs  Lab 10/21/22 0756 10/23/22 0845  WBC 11.3* 8.7  RBC 3.89 3.46*  HGB 12.1 10.6*  HCT 38.6 32.8*  MCV 99.2 94.8  MCH 31.1 30.6  MCHC 31.3 32.3  RDW 18.0* 18.1*  PLT 175 179    Cardiac EnzymesNo results for input(s): "TROPONINI" in the last 168 hours. No results for input(s): "TROPIPOC" in the last 168 hours.   BNPNo results for input(s): "BNP", "PROBNP" in the last 168 hours.   DDimer No results for input(s): "DDIMER" in the last 168 hours.   Radiology    No results found.  Cardiac Studies   TTE 09/15/2022 IMPRESSIONS     1. Left ventricular ejection fraction, by estimation, is 55 to 60%. The  left ventricle has normal function. The left ventricle has no regional  wall motion abnormalities. Left ventricular diastolic parameters are  indeterminate.   2. Right  ventricular systolic function is mildly reduced. The right  ventricular size is normal. There is severely elevated pulmonary artery  systolic pressure.   3. Left atrial size was severely dilated.   4. Right atrial size was mild to moderately dilated.   5. The mitral valve is degenerative. Mild mitral valve regurgitation.  Moderate mitral annular calcification.   6. Tricuspid valve regurgitation is moderate.   7. The aortic valve is tricuspid. Aortic valve regurgitation is not  visualized.   8. The inferior vena cava is normal in size with greater than 50%  respiratory variability, suggesting right atrial pressure of 3 mmHg.   FINDINGS   Left Ventricle: Left ventricular ejection fraction, by estimation, is 55  to 60%. The left ventricle has normal function. The left ventricle has no   regional wall motion abnormalities. The left ventricular internal cavity  size was normal in size. There is   no left ventricular hypertrophy. Left ventricular diastolic parameters  are indeterminate.   Right Ventricle: The right ventricular size is normal. Right ventricular  systolic function is mildly reduced. There is severely elevated pulmonary  artery systolic pressure. The tricuspid regurgitant velocity is 3.90 m/s,  and with an assumed right atrial  pressure of 3 mmHg, the estimated right ventricular systolic pressure is  63.8 mmHg.   Left Atrium: Left atrial size was severely dilated.   Right Atrium: Right atrial size was mild to moderately dilated.   Pericardium: There is no evidence of pericardial effusion.   Mitral Valve: The mitral valve is degenerative in appearance. Moderate  mitral annular calcification. Mild mitral valve regurgitation.   Tricuspid Valve: The tricuspid valve is normal in structure. Tricuspid  valve regurgitation is moderate.   Aortic Valve: The aortic valve is tricuspid. Aortic valve regurgitation is  not visualized.   Pulmonic Valve: The pulmonic valve was normal in structure. Pulmonic valve  regurgitation is not visualized.   Aorta: The aortic root and ascending aorta are structurally normal, with  no evidence of dilitation.   Venous: The inferior vena cava is normal in size with greater than 50%  respiratory variability, suggesting right atrial pressure of 3 mmHg.   IAS/Shunts: No atrial level shunt detected by color flow Doppler.    Patient Profile     87 y.o. female  with a hx of Hypertension, PAF  formerly on AC (dc after spontaneous renal hematoma), HTN, s/p whipple + (pancreatic mass, no malignancy, high grady dysplasia), RCC s/p embo/ ablation in 2023 and ESRD on hemodialysis, CAD mild to moderate tricuspid admitted for R frontal hemorrhagic contusion, non-displaced L parietal and occipital fx, L posterior scalp hematoma and now in  atrial fibrillation with rvr.  Assessment & Plan    Persistent atrial fibrillation  Nonobstructive CAD Hypertension - transient hypotension after beta blocker now resolved ESRD  Type 2 Diabetes Mellitus  She has converted to sinus bradycardia, continue amio gtt, will stop the Lopressor giving her low heart rate.  Will plan to convert her back to p.o. amiodarone tomorrow postdialysis.  Shared decision prior for no anticoagulation.   NO anginal symptoms    Diabetes mellitus pain management per primary team.  For questions or updates, please contact [facility] Please consult [email or web address] for contact info under Cardiology/STEMI.      Signed, Thomasene Ripple, DO  10/24/2022, 9:31 AM

## 2022-10-24 NOTE — Progress Notes (Signed)
Right AC PIV infiltrated w/ grade 2. Pharmacy updated. Site not painful nor red.

## 2022-10-24 NOTE — Evaluation (Signed)
Physical Therapy Evaluation Patient Details Name: Kim Burns MRN: [id number] DOB: 29-Oct-1935 Today's Date: 10/24/2022  History of Present Illness  Pt is an 87 y.o. female who presented to [facility] ED on 10/21/22 after being found asleep on the floor by family with pt not remembering falling. Pt found to have right frontal hemorrhagic contusion, a nondisplaced left parietal and occipital fracture, left posterior scalp hematoma, and small nondisplaced S4 fracture. CT scan on 10/24/22 revealed Small new Left > right Subdural Hematomas have developed since 10/21/22. Recent admission in July 2024 for afib with RVR. PMH - afib, ESRD on HD, chf, DM, HTN, Whipple.  Clinical Impression  Pt admitted with/for unremembered fall suffering R frontal contusion and L>R SDH along with other injuries.  Pt presently needing min assist overall, but movement quite limited by pain.Marland Kitchen  Pt currently limited functionally due to the problems listed. ( See problems list.)   Pt will benefit from PT to maximize function and safety in order to get ready for next venue listed below.          If plan is discharge home, recommend the following: A little help with walking and/or transfers;A little help with bathing/dressing/bathroom;Assistance with cooking/housework;Assist for transportation   Can travel by private vehicle        Equipment Recommendations None recommended by PT  Recommendations for Other Services       Functional Status Assessment Patient has had a recent decline in their functional status and demonstrates the ability to make significant improvements in function in a reasonable and predictable amount of time.     Precautions / Restrictions Precautions Precautions: Fall Restrictions Weight Bearing Restrictions: No      Mobility  Bed Mobility Overal bed mobility: Needs Assistance Bed Mobility: Supine to Sit, Sit to Supine Rolling: Min assist, Used rails   Supine to sit: Min assist Sit to supine: Min  assist   General bed mobility comments: assisted over to R UE.  Up with R elbow to sitting EOB.  Returned with LE assist    Transfers Overall transfer level: Needs assistance   Transfers: Sit to/from Stand Sit to Stand: Min assist                Ambulation/Gait Ambulation/Gait assistance: Min assist Gait Distance (Feet): 6 Feet (Forward and back) Assistive device: Rolling walker (2 wheels) Gait Pattern/deviations: Step-to pattern   Gait velocity interpretation: <1.31 ft/sec, indicative of household ambulator   General Gait Details: step to pattern with antalgic gait on the L.  generally tentative and painful.  Stairs            Wheelchair Mobility     Tilt Bed    Modified Rankin (Stroke Patients Only)       Balance Overall balance assessment: Needs assistance Sitting-balance support: No upper extremity supported, Feet supported Sitting balance-Leahy Scale: Fair       Standing balance-Leahy Scale: Fair Standing balance comment: Due to L LE pain, pt relied on UE assist of the Rollator.but could let go of either UE and maintain balance.                             Pertinent Vitals/Pain Pain Assessment Pain Assessment: Faces Faces Pain Scale: Hurts little more Pain Location: Left thigh Pain Descriptors / Indicators: Aching, Grimacing, Guarding    Home Living Family/patient expects to be discharged to:: Private residence Living Arrangements: Alone Available Help at Discharge: Family;Available 24  hours/day (Uncertain if this is acurate) Type of Home: House Home Access: Stairs to enter Entrance Stairs-Rails: Left Entrance Stairs-Number of Steps: 13 Alternate Level Stairs-Number of Steps: flight Home Layout: Two level;Able to live on main level with bedroom/bathroom Home Equipment: Rolling Walker (2 wheels);Shower seat;Rollator (4 wheels);Wheelchair - manual;Cane - single point Additional Comments: Pt appears to be an unreliable reporter.  During this session, pt reports that son and daughter stay with her 24/7, that son and daughter are taking turns staying with pt, that daughter is now teaching in [place], that no one was home when she fell, that her daughter was downstairs and she was upstairs when she fell, and that she does not go upstairs. Per chart, family found pt asleep on the floor when son-in-law arrived to take pt to HD.    Prior Function Prior Level of Function : Independent/Modified Independent             Mobility Comments: Per chart review, pt furniture walks in her home and uses a RW in the community. Per pt report this day, she uses a walking stick most of the time in and out of the home and a Rollator sometimes. ADLs Comments: Pt reports Supervision for ADLs and IADLs from son and daughter.     Extremity/Trunk Assessment   Upper Extremity Assessment Upper Extremity Assessment: Defer to OT evaluation (general bil weakness, but functional use for transitions/transfers and use of the RW) RUE Deficits / Details: gerneralized weakness; pt reposts hx of numbness in thumb and first finger (worse on Right); decreased fine motor coordinaiton RUE Sensation: history of peripheral neuropathy RUE Coordination: decreased fine motor LUE Deficits / Details: gerneralized weakness; pt reposts hx of numbness in thumb and first finger (worse on Right); decreased fine motor coordinaiton LUE Sensation: history of peripheral neuropathy LUE Coordination: decreased fine motor    Lower Extremity Assessment Lower Extremity Assessment: Generalized weakness;LLE deficits/detail LLE Deficits / Details: painful quads.  Slow to move due to stiffness LLE Coordination: decreased fine motor    Cervical / Trunk Assessment Cervical / Trunk Assessment: Normal  Communication   Communication Communication: No apparent difficulties  Cognition Arousal: Alert (Lethargic upon arrival but alerted quickly) Behavior During Therapy: Anxious,  Agitated (Mild with pt reporting she is uncertain why so many people keep coming to talk with her and asking her questions.) Overall Cognitive Status: No family/caregiver present to determine baseline cognitive functioning                                 General Comments: Uncertain of pt's baseline. Pt presents AAOx4 but with confusion regarding why she is seeing multiple care providers in hospital. Pt with impaired safety awareness and limited insight into deficits. Pt able to follow novel 1 step commands with increased time and verbal and tactile cues. Pt sequencing familiar tasks (ex. brushing teeth) appropriately without cues. Pt with Emergent awareness and SUstained attention.        General Comments General comments (skin integrity, edema, etc.): Pt with mildly irregular heart rate and with all other VSS on RA throughout session.    Exercises     Assessment/Plan    PT Assessment Patient needs continued PT services  PT Problem List Decreased strength;Decreased activity tolerance;Decreased balance;Decreased mobility;Decreased coordination       PT Treatment Interventions DME instruction;Gait training;Functional mobility training;Therapeutic activities;Balance training;Patient/family education    PT Goals (Current goals can be found in  the Care Plan section)  Acute Rehab PT Goals Patient Stated Goal: get back home with all this pain/ PT Goal Formulation: With patient Time For Goal Achievement: 11/07/22 Potential to Achieve Goals: Good    Frequency Min 1X/week     Co-evaluation               AM-PAC PT "6 Clicks" Mobility  Outcome Measure                  End of Session   Activity Tolerance: Patient tolerated treatment well   Nurse Communication: Mobility status PT Visit Diagnosis: Other abnormalities of gait and mobility (R26.89);Pain;Other symptoms and signs involving the nervous system (R29.898) Pain - Right/Left: Left Pain - part of body:  Leg    Time: 8119-1478 PT Time Calculation (min) (ACUTE ONLY): 28 min   Charges:   PT Evaluation $PT Eval Moderate Complexity: 1 Mod PT Treatments $Gait Training: 8-22 mins PT General Charges $$ ACUTE PT VISIT: 1 Visit         10/24/2022  Jacinto Halim., PT Acute Rehabilitation Services [phone]  (office)  Eliseo Gum Corlis Angelica 10/24/2022, 2:16 PM

## 2022-10-24 NOTE — Evaluation (Signed)
Occupational Therapy Evaluation Patient Details Name: Kim Burns MRN: [id number] DOB: 1935/02/25 Today's Date: 10/24/2022   History of Present Illness Pt is an 87 y.o. female who presented to [facility] ED on 10/21/22 after being found asleep on the floor by family with pt not remembering falling. Pt found to have right frontal hemorrhagic contusion, a nondisplaced left parietal and occipital fracture, left posterior scalp hematoma, and small nondisplaced S4 fracture. CT scan on 10/24/22 revealed Small new Left > right Subdural Hematomas have developed since 10/21/22. Recent admission in July 2024 for afib with RVR. PMH - afib, ESRD on HD, chf, DM, HTN, Whipple.   Clinical Impression   At baseline pt is Independent to Supervision for ADLs and functional transfers mobility. Pt assessed this day from bed level due to pt declining sitting EOB and OOB activity this session secondary to pt concerns regarding Afib and pain in Left thigh. Pt presents with decreased activity tolerance, mildly impaired cognition, generalized B UE weakness, decreased B UE fine motor coordination, and decreased safety and independence with ADLs and functional mobility/transfers. Pt now demonstrates ability to complete UB ADLs with Set up to Min assist from bed level and LB ADLs with Mod to Max assist from bed level. OT plans to further assess pt functional level with ADLs from sit/stand next session. Pt will benefit from acute skilled OT services to address deficits outlined below and increase safety and independence with functional tasks. OT currently recommends intensive inpatient skilled rehab < 3 hours per day post acute discharge. However, pt may progress to home health paired with 24/7 family supervision/assistance.       If plan is discharge home, recommend the following: A little help with walking and/or transfers;A lot of help with bathing/dressing/bathroom;Assistance with cooking/housework;Direct supervision/assist for  medications management;Direct supervision/assist for financial management;Assist for transportation;Help with stairs or ramp for entrance;Supervision due to cognitive status    Functional Status Assessment  Patient has had a recent decline in their functional status and demonstrates the ability to make significant improvements in function in a reasonable and predictable amount of time.  Equipment Recommendations  None recommended by OT (Pt has needed equipment)    Recommendations for Other Services       Precautions / Restrictions Precautions Precautions: Fall Restrictions Weight Bearing Restrictions: No      Mobility Bed Mobility Overal bed mobility: Needs Assistance Bed Mobility: Rolling Rolling: Contact guard assist, Used rails (with cues for technique)         General bed mobility comments: Pt declined sitting EOB    Transfers                   General transfer comment: Pt declined sititng EOB and OOB activity this session.      Balance Overall balance assessment: Needs assistance Sitting-balance support: No upper extremity supported (in long sit in bed) Sitting balance-Leahy Scale: Fair Sitting balance - Comments: Pt declined sititng EOB                                   ADL either performed or assessed with clinical judgement   ADL Overall ADL's : Needs assistance/impaired Eating/Feeding: Set up;Bed level (with HOB elevated)   Grooming: Set up;Bed level (with HOB elevated)   Upper Body Bathing: Minimal assistance;Bed level (with HOB elevated)   Lower Body Bathing: Moderate assistance;Bed level (with HOB elevated)   Upper Body Dressing : Contact  guard assist;Bed level (with HOB elevated)   Lower Body Dressing: Maximal assistance;Bed level (with HOB elevated)                 General ADL Comments: ADLs assessed from bed level secondary to pt declining sitting EOB or in recliner secondary to pt reporting concern over A Fib and  pain in Left thigh. OT educated pt in importance of OOB activity and in RN giving pt the OK to participate in OT session with pt continuing to decline.     Vision Baseline Vision/History: 1 Wears glasses (progressives) Ability to See in Adequate Light: 1 Impaired Patient Visual Report: Other (comment) (Pt reports recent changes before fall, including seeing black spots. Pt reports she is being followed by her PCP for vision changes)       Perception         Praxis         Pertinent Vitals/Pain Pain Assessment Pain Assessment: Faces Faces Pain Scale: Hurts little more Pain Location: Left thigh Pain Descriptors / Indicators: Aching, Grimacing, Guarding Pain Intervention(s): Limited activity within patient's tolerance, Monitored during session, Repositioned     Extremity/Trunk Assessment Upper Extremity Assessment Upper Extremity Assessment: Right hand dominant;Generalized weakness;RUE deficits/detail;LUE deficits/detail RUE Deficits / Details: gerneralized weakness; pt reposts hx of numbness in thumb and first finger (worse on Right); decreased fine motor coordinaiton RUE Sensation: history of peripheral neuropathy RUE Coordination: decreased fine motor LUE Deficits / Details: gerneralized weakness; pt reposts hx of numbness in thumb and first finger (worse on Right); decreased fine motor coordinaiton LUE Sensation: history of peripheral neuropathy LUE Coordination: decreased fine motor   Lower Extremity Assessment Lower Extremity Assessment: Defer to PT evaluation       Communication Communication Communication: No apparent difficulties   Cognition Arousal: Alert (Lethargic upon arrival but alerted quickly) Behavior During Therapy: Anxious, Agitated (Mild with pt reporting she is uncertain why so many people keep coming to talk with her and asking her questions.) Overall Cognitive Status: No family/caregiver present to determine baseline cognitive functioning                                  General Comments: Uncertain of pt's baseline. Pt presents AAOx4 but with confusion regarding why she is seeing multiple care providers in hospital. Pt with impaired safety awareness and limited insight into deficits. Pt able to follow novel 1 step commands with increased time and verbal and tactile cues. Pt sequencing familiar tasks (ex. brushing teeth) appropriately without cues. Pt with Emergent awareness and SUstained attention.     General Comments  Pt with mildly irregular heart rate and with all other VSS on RA throughout session.    Exercises     Shoulder Instructions      Home Living Family/patient expects to be discharged to:: Private residence Living Arrangements: Alone Available Help at Discharge: Family;Available 24 hours/day (Uncertain if this is acurate) Type of Home: House Home Access: Stairs to enter [employer] of Steps: 13 Entrance Stairs-Rails: Left Home Layout: Two level;Able to live on main level with bedroom/bathroom Alternate Level Stairs-Number of Steps: flight   Bathroom Shower/Tub: Tub/shower unit;Walk-in shower;Sponge bathes at baseline   Bathroom Toilet: Handicapped height     Home Equipment: Agricultural consultant (2 wheels);Shower seat;Rollator (4 wheels);Wheelchair - manual;Cane - single point   Additional Comments: Pt appears to be an unreliable reporter. During this session, pt reports that son and daughter  stay with her 24/7, that son and daughter are taking turns staying with pt, that daughter is now teaching in [place], that no one was home when she fell, that her daughter was downstairs and she was upstairs when she fell, and that she does not go upstairs. Per chart, family found pt asleep on the floor when son-in-law arrived to take pt to HD.      Prior Functioning/Environment Prior Level of Function : Independent/Modified Independent             Mobility Comments: Per chart review, pt furniture walks  in her home and uses a RW in the community. Per pt report this day, she uses a walking stick most of the time in and out of the home and a Rollator sometimes. ADLs Comments: Pt reports Supervision for ADLs and IADLs from son and daughter.        OT Problem List: Decreased strength;Decreased activity tolerance;Impaired balance (sitting and/or standing);Decreased coordination      OT Treatment/Interventions: Self-care/ADL training;Therapeutic exercise;DME and/or AE instruction;Therapeutic activities;Patient/family education;Balance training    OT Goals(Current goals can be found in the care plan section) Acute Rehab OT Goals Patient Stated Goal: To stop having so many people in and out of her room OT Goal Formulation: With patient Time For Goal Achievement: 11/07/22 Potential to Achieve Goals: Good ADL Goals Pt Will Perform Grooming: with supervision;standing Pt Will Perform Lower Body Bathing: with supervision;sit to/from stand Pt Will Perform Lower Body Dressing: with supervision;sit to/from stand Pt Will Transfer to Toilet: with supervision;ambulating;regular height toilet;grab bars (with least restrictive AD) Pt Will Perform Toileting - Clothing Manipulation and hygiene: with supervision;sitting/lateral leans;sit to/from stand  OT Frequency: Min 1X/week    Co-evaluation              AM-PAC OT "6 Clicks" Daily Activity     Outcome Measure Help from another person eating meals?: A Little Help from another person taking care of personal grooming?: A Little Help from another person toileting, which includes using toliet, bedpan, or urinal?: A Lot Help from another person bathing (including washing, rinsing, drying)?: A Lot Help from another person to put on and taking off regular upper body clothing?: A Little Help from another person to put on and taking off regular lower body clothing?: A Lot 6 Click Score: 15   End of Session Nurse Communication: Mobility status;Other  (comment) (Pt declining sitting EOB and OOB activity. pt with mildly irregular heart beat. Pt reports pain in L thigh.)  Activity Tolerance: Patient tolerated treatment well Patient left: in bed;with call bell/phone within reach;with bed alarm set  OT Visit Diagnosis: History of falling (Z91.81);Muscle weakness (generalized) (M62.81)                Time: 6440-3474 OT Time Calculation (min): 36 min Charges:  OT General Charges $OT Visit: 1 Visit OT Evaluation $OT Eval Moderate Complexity: 1 Mod OT Treatments $Self Care/Home Management : 8-22 mins  Shweta Aman "Orson Eva., OTR/L, MA Acute Rehab [phone]   Lendon Colonel 10/24/2022, 12:21 PM

## 2022-10-24 NOTE — Care Management Important Message (Signed)
Important Message  Patient Details  Name: Kim Burns MRN: [id number] Date of Birth: 04/08/1935   Medicare Important Message Given:  Yes     Renie Ora 10/24/2022, 8:40 AM

## 2022-10-24 NOTE — Progress Notes (Addendum)
PROGRESS NOTE    Kim Burns  ZOX:[id number] DOB: [birth year] DOA: 10/21/2022 PCP: Lewis Moccasin, MD   Brief Narrative: 87 year old past medical history significant for Whipple procedure, small bowel resection, due to nonmalignant pancreatic mass, RCC with left renal embolization and ablation with nephrostomy tube in place, hypertension, A-fib not on anticoagulation, ESRD on hemodialysis MWF, her son-in-law went to pick her up for hemodialysis on 9/2 and found her asleep on the floor.  She does not remember falling.  Evaluation in the ED patient was found to have a right frontal hemorrhagic contusion and nondisplaced left parietal and occipital fracture, left posterior scalp hematoma, small nondisplaced S4 fracture.  Patient admitted for traumatic brain injury after a fall with apparent loss of consciousness versus mechanical fall with intact consciousness.  Patient was evaluated by neurosurgery, admitted to the ICU,  systolic blood pressure recommended 90-160.  Assessment & Plan:   Principal Problem:   Traumatic brain injury [facility]) Active Problems:   Contusion of right cerebral hemisphere [facility])   Closed skull fracture (HCC)   Sacral fracture, closed (HCC)   Exocrine pancreatic insufficiency   Hypertension  1-TBI, right frontal contrecoup hemorrhagic cerebral contusion Nondisplaced left parietal and left occipital skull fracture Scalp hematoma Skull fracture: -CT head is stable.  Per neurosurgery continue to hold aspirin for 1 week, may start Lovenox for DVT prophylaxis in 72 hours from 9/3 -Awaiting PT OT recommendation -more confuse today. Nurse will inform neurosurgery.  -Patient became more confuse 9/04. Repeated CT head 9/05 shoed stable hemorrhagic contusion and two small hygroma. -Could consider repeat CT head tomorrow, discussed with Dr. Jordan Likes.  She is less confusion today. Following command Will try tylenol for pain, family concern with tramadol causing Hallucination.  Will discontinue tramadol.    2-Hypertension/hypotension -She gets midodrine with hemodialysis otherwise she is on metoprolol. These meds has been on hold.  -metoprolol has been on hold. Develops  A fib RVR--Started on IV amiodarone, rate better controlled.    A fib RVR;  Develops A fib RVR, started on IV amiodarone.  Appreciate cardiology evaluation.   ESRD on hemodialysis -MWF Appreciate nephrology assistance Couldn't complete 4 hours of HD on 9/04, due to tachycardia. Tx for 2 hours.   RCC s/p embolization/ablation, status post left nephrostomy tube Status post nephrostomy tube exchange on Status post left nephrostomy tube exchange 9/3   S/ post Whipple 2018 Exocrine pancreatic insufficiency  IDDM:  -SSI  Frequent fall.  Will need orthostatic vitals.  Needs PT/OT Subtle nondisplaced fracture of the anterior cortex of the sacrum at S4. Pain management.     Estimated body mass index is 22.53 kg/m as calculated from the following:   Height as of this encounter: 5\' 3"  (1.6 m).   Weight as of this encounter: 57.7 kg.   DVT prophylaxis: SCD Code Status: Full code Family Communication:Son over phone Disposition Plan:  Status is: Inpatient Remains inpatient appropriate because: management of cerebral contusion.     Consultants:  Neurosurgery Nephrology   Procedures:  none  Antimicrobials:    Subjective: She is alert today, less confuse. Denies pain   Objective: Vitals:   10/24/22 0308 10/24/22 0351 10/24/22 0400 10/24/22 0726  BP:  125/84  126/81  Pulse: 92 (!) 109  (!) 117  Resp: 17 19  17   Temp:   98.1 F (36.7 C) 98.6 F (37 C)  TempSrc:    Oral  SpO2: 96% 97%  97%  Weight:      Height:  Intake/Output Summary (Last 24 hours) at 10/24/2022 0917 Last data filed at 10/24/2022 0981 Gross per 24 hour  Intake 412.96 ml  Output 925 ml  Net -512.04 ml   Filed Weights   10/23/22 0500 10/23/22 0817 10/23/22 1242  Weight: 57.8 kg 61.4 kg  57.7 kg    Examination:  General exam: NAD Respiratory system: CTA Cardiovascular system: S 1, S 2 IRR Gastrointestinal system: BS present, soft, nt Central nervous system: alert following command Extremities: No edema  Data Reviewed: I have personally reviewed following labs and imaging studies  CBC: Recent Labs  Lab 10/21/22 0756 10/23/22 0845  WBC 11.3* 8.7  NEUTROABS 9.5*  --   HGB 12.1 10.6*  HCT 38.6 32.8*  MCV 99.2 94.8  PLT 175 179   Basic Metabolic Panel: Recent Labs  Lab 10/21/22 0756 10/23/22 0603 10/23/22 0845  NA 132* 133* 132*  K 3.9 5.1 3.8  CL 98 95* 95*  CO2 20* 19* 23  GLUCOSE 153* 114* 111*  BUN 40* 45* 46*  CREATININE 5.37* 4.34* 4.32*  CALCIUM 8.3* 8.1* 7.6*  PHOS  --  8.6* 8.2*   GFR: Estimated Creatinine Clearance: 7.9 mL/min (A) (by C-G formula based on SCr of 4.32 mg/dL (H)). Liver Function Tests: No results for input(s): "AST", "ALT", "ALKPHOS", "BILITOT", "PROT", "ALBUMIN" in the last 168 hours. No results for input(s): "LIPASE", "AMYLASE" in the last 168 hours. No results for input(s): "AMMONIA" in the last 168 hours. Coagulation Profile: No results for input(s): "INR", "PROTIME" in the last 168 hours. Cardiac Enzymes: No results for input(s): "CKTOTAL", "CKMB", "CKMBINDEX", "TROPONINI" in the last 168 hours. BNP (last 3 results) No results for input(s): "PROBNP" in the last 8760 hours. HbA1C: No results for input(s): "HGBA1C" in the last 72 hours. CBG: Recent Labs  Lab 10/22/22 1105 10/22/22 1553 10/22/22 2246 10/23/22 2152 10/24/22 0607  GLUCAP 84 101* 108* 155* 104*   Lipid Profile: No results for input(s): "CHOL", "HDL", "LDLCALC", "TRIG", "CHOLHDL", "LDLDIRECT" in the last 72 hours. Thyroid Function Tests: No results for input(s): "TSH", "T4TOTAL", "FREET4", "T3FREE", "THYROIDAB" in the last 72 hours. Anemia Panel: No results for input(s): "VITAMINB12", "FOLATE", "FERRITIN", "TIBC", "IRON", "RETICCTPCT" in the  last 72 hours. Sepsis Labs: No results for input(s): "PROCALCITON", "LATICACIDVEN" in the last 168 hours.  Recent Results (from the past 240 hour(s))  MRSA Next Gen by PCR, Nasal     Status: None   Collection Time: 10/21/22 12:32 PM   Specimen: Nasal Mucosa; Nasal Swab  Result Value Ref Range Status   MRSA by PCR Next Gen NOT DETECTED NOT DETECTED Final    Comment: (NOTE) The GeneXpert MRSA Assay (FDA approved for NASAL specimens only), is one component of a comprehensive MRSA colonization surveillance program. It is not intended to diagnose MRSA infection nor to guide or monitor treatment for MRSA infections. Test performance is not FDA approved in patients less than 53 years old. Performed at [facility], [address], [place], [place] [address]          Radiology Studies: No results found.      Scheduled Meds:  Chlorhexidine Gluconate Cloth  6 each Topical Daily   insulin aspart  0-6 Units Subcutaneous TID WC   lipase/protease/amylase  36,000 Units Oral BID WC   metoprolol tartrate  12.5 mg Oral BID   prednisoLONE acetate  1 drop Both Eyes BID   sevelamer carbonate  2,400 mg Oral TID WC   valACYclovir  500 mg Oral  QHS   Continuous Infusions:  amiodarone 30 mg/hr (10/24/22 0622)     LOS: 3 days    Time spent: 35 minutes    Syvanna Ciolino A Allisen Pidgeon, MD [facility]   If 7PM-7AM, please contact night-coverage [email or web address]  10/24/2022, 9:17 AM

## 2022-10-24 NOTE — Plan of Care (Signed)
  Problem: Coping: Goal: Ability to adjust to condition or change in health will improve Outcome: Progressing   Problem: Health Behavior/Discharge Planning: Goal: Ability to identify and utilize available resources and services will improve Outcome: Progressing Goal: Ability to manage health-related needs will improve Outcome: Progressing   Problem: Nutritional: Goal: Maintenance of adequate nutrition will improve Outcome: Progressing   Problem: Health Behavior/Discharge Planning: Goal: Ability to manage health-related needs will improve Outcome: Progressing   Problem: Clinical Measurements: Goal: Ability to maintain clinical measurements within normal limits will improve Outcome: Progressing   Problem: Elimination: Goal: Will not experience complications related to bowel motility Outcome: Progressing

## 2022-10-25 ENCOUNTER — Inpatient Hospital Stay (HOSPITAL_COMMUNITY): Payer: Medicare PPO

## 2022-10-25 DIAGNOSIS — R55 Syncope and collapse: Secondary | ICD-10-CM | POA: Diagnosis not present

## 2022-10-25 DIAGNOSIS — I480 Paroxysmal atrial fibrillation: Secondary | ICD-10-CM | POA: Diagnosis not present

## 2022-10-25 DIAGNOSIS — S0631AA Contusion and laceration of right cerebrum with loss of consciousness status unknown, initial encounter: Secondary | ICD-10-CM | POA: Diagnosis not present

## 2022-10-25 LAB — GLUCOSE, CAPILLARY
Glucose-Capillary: 147 mg/dL — ABNORMAL HIGH (ref 70–99)
Glucose-Capillary: 115 mg/dL — ABNORMAL HIGH (ref 70–99)
Glucose-Capillary: 161 mg/dL — ABNORMAL HIGH (ref 70–99)

## 2022-10-25 LAB — ECHOCARDIOGRAM COMPLETE
AR max vel: 2.18 cm2
AV Area VTI: 2.26 cm2
AV Area mean vel: 1.93 cm2
AV Mean grad: 3 mmHg
AV Peak grad: 5.3 mmHg
Ao pk vel: 1.15 m/s
Area-P 1/2: 4.29 cm2
Height: 63 in
S' Lateral: 2.9 cm
Weight: 1968.27 [oz_av]

## 2022-10-25 MED ORDER — MECLIZINE HCL 12.5 MG PO TABS
12.5000 mg | ORAL_TABLET | Freq: Three times a day (TID) | ORAL | Status: DC | PRN
  Administered 2022-10-26 – 2022-10-30 (×5): 12.5 mg via ORAL
  Filled 2022-10-25 (×10): qty 1

## 2022-10-25 MED ORDER — METOPROLOL TARTRATE 25 MG PO TABS
25.0000 mg | ORAL_TABLET | Freq: Two times a day (BID) | ORAL | Status: DC
  Administered 2022-10-25 – 2022-10-27 (×5): 25 mg via ORAL
  Filled 2022-10-25 (×5): qty 1

## 2022-10-25 MED ORDER — ACETAMINOPHEN 500 MG PO TABS
500.0000 mg | ORAL_TABLET | Freq: Three times a day (TID) | ORAL | Status: DC
  Administered 2022-10-25 – 2022-10-29 (×13): 500 mg via ORAL
  Filled 2022-10-25 (×13): qty 1

## 2022-10-25 NOTE — Progress Notes (Signed)
   [facility] Progress Note   Subjective:   Patient seen in room. No /co's today.   Objective Vitals:   10/25/22 1030 10/25/22 1100 10/25/22 1130 10/25/22 1200  BP: (!) 140/89 (!) 143/97 (!) 125/97 (!) 141/105  Pulse: (!) 118 (!) 121    Resp: 17     Temp:      TempSrc:      SpO2:      Weight:      Height:       Physical Exam General: Frail-appearing elderly female lying supine on hospital bed.  Heart:RRR, no M/R/G Lungs: clear bilat  Abdomen: soft, non-tender, non-distended.  Extremities: Trace pretib edema  Dialysis Access: LUA AVF +thrill   OP HD:  [facility] MWF   3.5h   55kg   350/1.5   2/2 bath  L AVF  Heparin none - last dialyzed on 8/30, post wt 54.5kg.   Assessment/Plan:  # Frontal Contusion: Managed by neurosurgery. F/u CT was normal. She seems to be improved.   # ESRD - MWF HD. Cont MWF here as allowed. HD today in progress this am.   # Hypertension: BP is improved since admit. On po metoprolol here. BP's stable.   # Anemia of CKD: Trend CBCs. Doing well at present.   #  Hyperglycemia: Managed by CC team. On sliding scale.    Vinson Moselle  MD  [facility] 10/25/2022, 12:18 PM  Recent Labs  Lab 10/23/22 0603 10/23/22 0845 10/24/22 1536  HGB  --  10.6* 13.0  CALCIUM 8.1* 7.6* 8.2*  PHOS 8.6* 8.2*  --   CREATININE 4.34* 4.32* 3.59*  K 5.1 3.8 3.8    Inpatient medications:  acetaminophen  500 mg Oral TID   Chlorhexidine Gluconate Cloth  6 each Topical Daily   Chlorhexidine Gluconate Cloth  6 each Topical Q0600   insulin aspart  0-6 Units Subcutaneous TID WC   lipase/protease/amylase  36,000 Units Oral BID WC   metoprolol tartrate  25 mg Oral Q12H   prednisoLONE acetate  1 drop Both Eyes BID   rOPINIRole  0.5 mg Oral QHS   sevelamer carbonate  2,400 mg Oral TID WC   valACYclovir  500 mg Oral QHS    amiodarone 30 mg/hr (10/24/22 0622)   acetaminophen, cycloSPORINE, docusate sodium, hydrALAZINE, meclizine, olopatadine, ondansetron (ZOFRAN) IV,  mouth rinse, polyethylene glycol, rOPINIRole

## 2022-10-25 NOTE — Progress Notes (Signed)
Progress Note  Patient Name: Kim Burns Date of Encounter: 10/25/2022  Primary Cardiologist: Peter Swaziland, MD   Subjective   Patient seen and examined at her bedside.   Inpatient Medications    Scheduled Meds:  acetaminophen  500 mg Oral TID   Chlorhexidine Gluconate Cloth  6 each Topical Daily   Chlorhexidine Gluconate Cloth  6 each Topical Q0600   insulin aspart  0-6 Units Subcutaneous TID WC   lipase/protease/amylase  36,000 Units Oral BID WC   prednisoLONE acetate  1 drop Both Eyes BID   rOPINIRole  0.5 mg Oral QHS   sevelamer carbonate  2,400 mg Oral TID WC   valACYclovir  500 mg Oral QHS   Continuous Infusions:  amiodarone 30 mg/hr (10/24/22 0622)   PRN Meds: acetaminophen, cycloSPORINE, docusate sodium, hydrALAZINE, meclizine, olopatadine, ondansetron (ZOFRAN) IV, mouth rinse, polyethylene glycol, rOPINIRole   Vital Signs    Vitals:   10/25/22 0209 10/25/22 0305 10/25/22 0700 10/25/22 0728  BP:  123/82  (!) 157/86  Pulse: 72 (!) 113 (!) 110 (!) 119  Resp: 20 16 19 18   Temp:  (!) 97.5 F (36.4 C)  97.8 F (36.6 C)  TempSrc:  Oral  Oral  SpO2: 98% 98% 95% 96%  Weight:  52.4 kg    Height:        Intake/Output Summary (Last 24 hours) at 10/25/2022 1013 Last data filed at 10/25/2022 0208 Gross per 24 hour  Intake 842.54 ml  Output 0 ml  Net 842.54 ml   Filed Weights   10/23/22 0817 10/23/22 1242 10/25/22 0305  Weight: 61.4 kg 57.7 kg 52.4 kg    Telemetry    Back in atrial fibrillation-  Personally Reviewed  ECG    None today  - Personally Reviewed  Physical Exam    General: Comfortable Head: Atraumatic, normal size  Eyes: PEERLA, EOMI  Neck: Supple, normal JVD Cardiac: Normal S1, S2; RRR; no murmurs, rubs, or gallops Lungs: Clear to auscultation bilaterally Abd: Soft, nontender, no hepatomegaly  Ext: warm, no edema Musculoskeletal: No deformities, BUE and BLE strength normal and equal Skin: Warm and dry, no rashes   Neuro: Alert and  oriented to person, place, time, and situation, CNII-XII grossly intact, no focal deficits  Psych: Normal mood and affect   Labs    Chemistry Recent Labs  Lab 10/23/22 0603 10/23/22 0845 10/24/22 1536  NA 133* 132* 130*  K 5.1 3.8 3.8  CL 95* 95* 90*  CO2 19* 23 23  GLUCOSE 114* 111* 145*  BUN 45* 46* 32*  CREATININE 4.34* 4.32* 3.59*  CALCIUM 8.1* 7.6* 8.2*  GFRNONAA 9* 10* 12*  ANIONGAP 19* 14 17*     Hematology Recent Labs  Lab 10/21/22 0756 10/23/22 0845 10/24/22 1536  WBC 11.3* 8.7 7.7  RBC 3.89 3.46* 4.19  HGB 12.1 10.6* 13.0  HCT 38.6 32.8* 40.4  MCV 99.2 94.8 96.4  MCH 31.1 30.6 31.0  MCHC 31.3 32.3 32.2  RDW 18.0* 18.1* 17.9*  PLT 175 179 195    Cardiac EnzymesNo results for input(s): "TROPONINI" in the last 168 hours. No results for input(s): "TROPIPOC" in the last 168 hours.   BNPNo results for input(s): "BNP", "PROBNP" in the last 168 hours.   DDimer No results for input(s): "DDIMER" in the last 168 hours.   Radiology    DG HIP UNILAT WITH PELVIS 2-3 VIEWS LEFT  Result Date: 10/25/2022 CLINICAL DATA:  Pain. EXAM: DG HIP (WITH OR WITHOUT PELVIS)  2-3V LEFT COMPARISON:  Lumbar spine CT 10/21/2022 FINDINGS: The bones are subjectively under mineralized. Irregularity of the left inferior pubic ramus may represent a nondisplaced fracture in the appropriate clinical setting. Known sacral fracture is not demonstrated by radiograph. Slight hip joint space narrowing and spurring. No erosive change or evidence of avascular necrosis. Pubic symphysis and sacroiliac joints are congruent. There are multiple pelvic phleboliths. Lower extremity arterial vascular calcifications. IMPRESSION: 1. Irregularity of the left inferior pubic ramus may represent a nondisplaced fracture in the appropriate clinical setting. 2. Mild left hip osteoarthritis. Electronically Signed   By: Narda Rutherford M.D.   On: 10/25/2022 10:00   CT HEAD WO CONTRAST ( )  Addendum Date: 10/24/2022    ADDENDUM REPORT: 10/24/2022 10:51 ADDENDUM: Study discussed by telephone with Dr. Hartley Barefoot on 10/24/2022 at 1039 hours. Electronically Signed   By: Odessa Fleming M.D.   On: 10/24/2022 10:51   Result Date: 10/24/2022 CLINICAL DATA:  87 year old female with traumatic brain injury, right inferior frontal gyrus hemorrhagic contusion. Altered mental status. EXAM: CT HEAD WITHOUT CONTRAST TECHNIQUE: Contiguous axial images were obtained from the base of the skull through the vertex without intravenous contrast. RADIATION DOSE REDUCTION: This exam was performed according to the departmental dose-optimization program which includes automated exposure control, adjustment of the mA and/or kV according to patient size and/or use of iterative reconstruction technique. COMPARISON:  10/22/2022 and earlier. FINDINGS: Brain: Right inferior frontal gyrus rounded hemorrhagic contusion with regional hypodense edema is stable since presentation on 10/21/2022. No significant regional mass effect. Basilar cisterns remain patent. However, there is a small new mixed density, low-density but not entirely CSF, left side subdural collection since 10/21/2022 measuring up to the 5 mm thickness along the left superior convexity on coronal image 41. And there is a smaller, subtle new right subdural collection since that time, about 2 mm. Trace new rightward midline shift, new since 10/22/2022. No other new intracranial hemorrhage. No IVH. No ventriculomegaly. Stable gray-white matter differentiation throughout the brain. No cortically based acute infarct identified. Vascular: Calcified atherosclerosis at the skull base. No suspicious intracranial vascular hyperdensity. Skull: Stable.  Nondisplaced left occipital bone fracture. Sinuses/Orbits: Visualized paranasal sinuses and mastoids are stable and well aerated. Other: Posterior convexity mildly lobulated scalp hematoma has not significantly changed. Orbits soft tissues appears stable and  negative. IMPRESSION: 1. Small new Left > right Subdural Hematomas have developed since 10/21/2022, up to 5 mm on the left and 2 mm on the right. Trace new rightward midline shift, no other significant intracranial mass effect. 2. Right anterior frontal lobe hemorrhagic contusion remains stable. And no other new intracranial abnormality. 3. Nondisplaced left occipital bone fracture. Posterior scalp hematoma. Electronically Signed: By: Odessa Fleming M.D. On: 10/24/2022 10:37    Cardiac Studies   TTE 09/15/2022 IMPRESSIONS     1. Left ventricular ejection fraction, by estimation, is 55 to 60%. The  left ventricle has normal function. The left ventricle has no regional  wall motion abnormalities. Left ventricular diastolic parameters are  indeterminate.   2. Right ventricular systolic function is mildly reduced. The right  ventricular size is normal. There is severely elevated pulmonary artery  systolic pressure.   3. Left atrial size was severely dilated.   4. Right atrial size was mild to moderately dilated.   5. The mitral valve is degenerative. Mild mitral valve regurgitation.  Moderate mitral annular calcification.   6. Tricuspid valve regurgitation is moderate.   7. The aortic valve is tricuspid. Aortic  valve regurgitation is not  visualized.   8. The inferior vena cava is normal in size with greater than 50%  respiratory variability, suggesting right atrial pressure of 3 mmHg.   FINDINGS   Left Ventricle: Left ventricular ejection fraction, by estimation, is 55  to 60%. The left ventricle has normal function. The left ventricle has no  regional wall motion abnormalities. The left ventricular internal cavity  size was normal in size. There is   no left ventricular hypertrophy. Left ventricular diastolic parameters  are indeterminate.   Right Ventricle: The right ventricular size is normal. Right ventricular  systolic function is mildly reduced. There is severely elevated pulmonary   artery systolic pressure. The tricuspid regurgitant velocity is 3.90 m/s,  and with an assumed right atrial  pressure of 3 mmHg, the estimated right ventricular systolic pressure is  63.8 mmHg.   Left Atrium: Left atrial size was severely dilated.   Right Atrium: Right atrial size was mild to moderately dilated.   Pericardium: There is no evidence of pericardial effusion.   Mitral Valve: The mitral valve is degenerative in appearance. Moderate  mitral annular calcification. Mild mitral valve regurgitation.   Tricuspid Valve: The tricuspid valve is normal in structure. Tricuspid  valve regurgitation is moderate.   Aortic Valve: The aortic valve is tricuspid. Aortic valve regurgitation is  not visualized.   Pulmonic Valve: The pulmonic valve was normal in structure. Pulmonic valve  regurgitation is not visualized.   Aorta: The aortic root and ascending aorta are structurally normal, with  no evidence of dilitation.   Venous: The inferior vena cava is normal in size with greater than 50%  respiratory variability, suggesting right atrial pressure of 3 mmHg.   IAS/Shunts: No atrial level shunt detected by color flow Doppler.    Patient Profile     87 y.o. female  with a hx of Hypertension, PAF  formerly on AC (dc after spontaneous renal hematoma), HTN, s/p whipple + (pancreatic mass, no malignancy, high grady dysplasia), RCC s/p embo/ ablation in 2023 and ESRD on hemodialysis, CAD mild to moderate tricuspid admitted for R frontal hemorrhagic contusion, non-displaced L parietal and occipital fx, L posterior scalp hematoma and now in atrial fibrillation with rvr.  Assessment & Plan    Persistent atrial fibrillation  Nonobstructive CAD Hypertension - transient hypotension after beta blocker now resolved ESRD  Type 2 Diabetes Mellitus  Back in atrial fibrillation, continue amio gtt, will restart Lopressor .  Keep amiodarone drip for now will reassess postdialysis postdialysis.   Shared decision prior for no anticoagulation.   NO anginal symptoms. Diabetes mellitus being managed by the primary team.   For questions or updates, please contact [facility] Please consult [email or web address] for contact info under Cardiology/STEMI.      Signed, Yaremi Stahlman, DO  10/25/2022, 10:13 AM

## 2022-10-25 NOTE — Progress Notes (Addendum)
PROGRESS NOTE    Kim Burns  ZOX:[id number] DOB: September 13, 1935 DOA: 10/21/2022 PCP: Lewis Moccasin, MD   Brief Narrative: 87 year old past medical history significant for Whipple procedure, small bowel resection, due to nonmalignant pancreatic mass, RCC with left renal embolization and ablation with nephrostomy tube in place, hypertension, A-fib not on anticoagulation, ESRD on hemodialysis MWF, her son-in-law went to pick her up for hemodialysis on 9/2 and found her asleep on the floor.  She does not remember falling.  Evaluation in the ED patient was found to have a right frontal hemorrhagic contusion and nondisplaced left parietal and occipital fracture, left posterior scalp hematoma, small nondisplaced S4 fracture.  Patient admitted for traumatic brain injury after a fall with apparent loss of consciousness versus mechanical fall with intact consciousness.  Patient was evaluated by neurosurgery, admitted to the ICU,  systolic blood pressure recommended 90-160.  Assessment & Plan:   Principal Problem:   Traumatic brain injury [facility]) Active Problems:   Contusion of right cerebral hemisphere [facility])   Closed skull fracture (HCC)   Sacral fracture, closed (HCC)   Exocrine pancreatic insufficiency   Hypertension  1-TBI, right frontal contrecoup hemorrhagic cerebral contusion Nondisplaced left parietal and left occipital skull fracture Scalp hematoma Skull fracture: -CT head is stable.  Per neurosurgery continue to hold aspirin for 1 week, may start Lovenox for DVT prophylaxis in 72 hours from 9/3 -Awaiting PT OT recommendation -had some confusion during course hospitalization.  -Patient became more confuse 9/04. Repeated CT head 9/05 showed stable hemorrhagic contusion and two small hygroma. -report dizziness, and lower extremity weakness, will proceed with MRI brain instead of CT scan.  -Less confused. Report dizziness with movement of head,  -On try tylenol for pain, family concern  with tramadol causing Hallucination. Discontinue tramadol.    2-Hypertension/hypotension -She gets midodrine with hemodialysis, currently on hold -Develops  A fib RVR--Started on IV amiodarone, rate better controlled.    A fib RVR;  Develops A fib RVR, started on IV amiodarone 9/04.  Appreciate cardiology evaluation.  Plan to resume metoprolol.  Continue with IV amiodarone.  No anticoagulation due to prior history of bleeding pre nephrostomy and now new hemorrhagic cerebral contusion.   ESRD on hemodialysis -MWF Appreciate nephrology assistance For HD this AM.   RCC s/p embolization/ablation, status post left nephrostomy tube Status post nephrostomy tube exchange on Status post left nephrostomy tube exchange 9/3   S/ post Whipple 2018 Exocrine pancreatic insufficiency  IDDM:  -SSI  Frequent fall.  Will need orthostatic vitals.  Needs PT/OT Subtle nondisplaced fracture of the anterior cortex of the sacrum at S4. Pain management.  Report hip pain: x ray: Irregularity of the left inferior pubic ramus may represent a nondisplaced fracture in the appropriate clinical setting.  Mild left hip osteoarthritis Will get MRI, will also get ECHO>   Restless leg Syndrome:  Resume home dose requip.   Estimated body mass index is 20.46 kg/m as calculated from the following:   Height as of this encounter: 5\' 3"  (1.6 m).   Weight as of this encounter: 52.4 kg.   DVT prophylaxis: SCD Code Status: Full code Family Communication: Son over phone 9/05 Disposition Plan:  Status is: Inpatient Remains inpatient appropriate because: management of cerebral contusion.     Consultants:  Neurosurgery Nephrology   Procedures:  none  Antimicrobials:    Subjective: Seen in her room this am. She was alert. She was able to sleep all night.  She report dizziness when she  lift or move her head.  She report left hip pain, also report some weakness left leg. She is able to moves her  left leg better.   Objective: Vitals:   10/25/22 0209 10/25/22 0305 10/25/22 0700 10/25/22 0728  BP:  123/82  (!) 157/86  Pulse: 72 (!) 113 (!) 110 (!) 119  Resp: 20 16 19 18   Temp:  (!) 97.5 F (36.4 C)  97.8 F (36.6 C)  TempSrc:  Oral  Oral  SpO2: 98% 98% 95% 96%  Weight:  52.4 kg    Height:        Intake/Output Summary (Last 24 hours) at 10/25/2022 0737 Last data filed at 10/25/2022 0208 Gross per 24 hour  Intake 842.54 ml  Output 0 ml  Net 842.54 ml   Filed Weights   10/23/22 0817 10/23/22 1242 10/25/22 0305  Weight: 61.4 kg 57.7 kg 52.4 kg    Examination:  General exam: NAD Respiratory system: CTA Cardiovascular system: S 1, S 2 RRR Gastrointestinal system: BS present, soft, nt Central nervous system: Alert, follows command Extremities: No edema  Data Reviewed: I have personally reviewed following labs and imaging studies  CBC: Recent Labs  Lab 10/21/22 0756 10/23/22 0845 10/24/22 1536  WBC 11.3* 8.7 7.7  NEUTROABS 9.5*  --   --   HGB 12.1 10.6* 13.0  HCT 38.6 32.8* 40.4  MCV 99.2 94.8 96.4  PLT 175 179 195   Basic Metabolic Panel: Recent Labs  Lab 10/21/22 0756 10/23/22 0603 10/23/22 0845 10/24/22 1536  NA 132* 133* 132* 130*  K 3.9 5.1 3.8 3.8  CL 98 95* 95* 90*  CO2 20* 19* 23 23  GLUCOSE 153* 114* 111* 145*  BUN 40* 45* 46* 32*  CREATININE 5.37* 4.34* 4.32* 3.59*  CALCIUM 8.3* 8.1* 7.6* 8.2*  PHOS  --  8.6* 8.2*  --    GFR: Estimated Creatinine Clearance: 9.5 mL/min (A) (by C-G formula based on SCr of 3.59 mg/dL (H)). Liver Function Tests: No results for input(s): "AST", "ALT", "ALKPHOS", "BILITOT", "PROT", "ALBUMIN" in the last 168 hours. No results for input(s): "LIPASE", "AMYLASE" in the last 168 hours. No results for input(s): "AMMONIA" in the last 168 hours. Coagulation Profile: No results for input(s): "INR", "PROTIME" in the last 168 hours. Cardiac Enzymes: No results for input(s): "CKTOTAL", "CKMB", "CKMBINDEX", "TROPONINI"  in the last 168 hours. BNP (last 3 results) No results for input(s): "PROBNP" in the last 8760 hours. HbA1C: No results for input(s): "HGBA1C" in the last 72 hours. CBG: Recent Labs  Lab 10/24/22 0607 10/24/22 1105 10/24/22 1617 10/24/22 2107 10/25/22 0601  GLUCAP 104* 116* 159* 165* 147*   Lipid Profile: No results for input(s): "CHOL", "HDL", "LDLCALC", "TRIG", "CHOLHDL", "LDLDIRECT" in the last 72 hours. Thyroid Function Tests: No results for input(s): "TSH", "T4TOTAL", "FREET4", "T3FREE", "THYROIDAB" in the last 72 hours. Anemia Panel: No results for input(s): "VITAMINB12", "FOLATE", "FERRITIN", "TIBC", "IRON", "RETICCTPCT" in the last 72 hours. Sepsis Labs: No results for input(s): "PROCALCITON", "LATICACIDVEN" in the last 168 hours.  Recent Results (from the past 240 hour(s))  MRSA Next Gen by PCR, Nasal     Status: None   Collection Time: 10/21/22 12:32 PM   Specimen: Nasal Mucosa; Nasal Swab  Result Value Ref Range Status   MRSA by PCR Next Gen NOT DETECTED NOT DETECTED Final    Comment: (NOTE) The GeneXpert MRSA Assay (FDA approved for NASAL specimens only), is one component of a comprehensive MRSA colonization surveillance program. It is  not intended to diagnose MRSA infection nor to guide or monitor treatment for MRSA infections. Test performance is not FDA approved in patients less than 54 years old. Performed at [facility], [address] [place], [place] [address]          Radiology Studies: CT HEAD WO CONTRAST ( )  Addendum Date: 10/24/2022   ADDENDUM REPORT: 10/24/2022 10:51 ADDENDUM: Study discussed by telephone with Dr. Hartley Barefoot on 10/24/2022 at 1039 hours. Electronically Signed   By: Odessa Fleming M.D.   On: 10/24/2022 10:51   Result Date: 10/24/2022 CLINICAL DATA:  87 year old female with traumatic brain injury, right inferior frontal gyrus hemorrhagic contusion. Altered mental status. EXAM: CT HEAD WITHOUT CONTRAST TECHNIQUE: Contiguous  axial images were obtained from the base of the skull through the vertex without intravenous contrast. RADIATION DOSE REDUCTION: This exam was performed according to the departmental dose-optimization program which includes automated exposure control, adjustment of the mA and/or kV according to patient size and/or use of iterative reconstruction technique. COMPARISON:  10/22/2022 and earlier. FINDINGS: Brain: Right inferior frontal gyrus rounded hemorrhagic contusion with regional hypodense edema is stable since presentation on 10/21/2022. No significant regional mass effect. Basilar cisterns remain patent. However, there is a small new mixed density, low-density but not entirely CSF, left side subdural collection since 10/21/2022 measuring up to the 5 mm thickness along the left superior convexity on coronal image 41. And there is a smaller, subtle new right subdural collection since that time, about 2 mm. Trace new rightward midline shift, new since 10/22/2022. No other new intracranial hemorrhage. No IVH. No ventriculomegaly. Stable gray-white matter differentiation throughout the brain. No cortically based acute infarct identified. Vascular: Calcified atherosclerosis at the skull base. No suspicious intracranial vascular hyperdensity. Skull: Stable.  Nondisplaced left occipital bone fracture. Sinuses/Orbits: Visualized paranasal sinuses and mastoids are stable and well aerated. Other: Posterior convexity mildly lobulated scalp hematoma has not significantly changed. Orbits soft tissues appears stable and negative. IMPRESSION: 1. Small new Left > right Subdural Hematomas have developed since 10/21/2022, up to 5 mm on the left and 2 mm on the right. Trace new rightward midline shift, no other significant intracranial mass effect. 2. Right anterior frontal lobe hemorrhagic contusion remains stable. And no other new intracranial abnormality. 3. Nondisplaced left occipital bone fracture. Posterior scalp hematoma.  Electronically Signed: By: Odessa Fleming M.D. On: 10/24/2022 10:37        Scheduled Meds:  Chlorhexidine Gluconate Cloth  6 each Topical Daily   Chlorhexidine Gluconate Cloth  6 each Topical Q0600   insulin aspart  0-6 Units Subcutaneous TID WC   lipase/protease/amylase  36,000 Units Oral BID WC   prednisoLONE acetate  1 drop Both Eyes BID   rOPINIRole  0.5 mg Oral QHS   sevelamer carbonate  2,400 mg Oral TID WC   valACYclovir  500 mg Oral QHS   Continuous Infusions:  amiodarone 30 mg/hr (10/24/22 0622)     LOS: 4 days    Time spent: 35 minutes    Curvin Hunger A Wendy Hoback, MD [facility]   If 7PM-7AM, please contact night-coverage [email or web address]  10/25/2022, 7:37 AM

## 2022-10-25 NOTE — Progress Notes (Signed)
  Inpatient Rehabilitation Admissions Coordinator   Noted medically complex patient with TBI. I will place rehab consult to assess for candidacy for possible [facility] admit given her complex case.  Ottie Glazier, RN, MSN Rehab Admissions Coordinator [phone] 10/25/2022 5:54 PM

## 2022-10-25 NOTE — Plan of Care (Signed)
  Problem: Education: Goal: Knowledge of General Education information will improve Description: Including pain rating scale, medication(s)/side effects and non-pharmacologic comfort measures Outcome: Progressing   Problem: Health Behavior/Discharge Planning: Goal: Ability to manage health-related needs will improve Outcome: Progressing   Problem: Clinical Measurements: Goal: Ability to maintain clinical measurements within normal limits will improve Outcome: Progressing Goal: Cardiovascular complication will be avoided Outcome: Progressing   Problem: Activity: Goal: Risk for activity intolerance will decrease Outcome: Progressing   Problem: Coping: Goal: Level of anxiety will decrease Outcome: Progressing   Problem: Skin Integrity: Goal: Risk for impaired skin integrity will decrease Outcome: Progressing

## 2022-10-25 NOTE — Progress Notes (Signed)
PT Cancellation Note  Patient Details Name: Kim Burns MRN: [id number] DOB: 12-03-1935   Cancelled Treatment:    Reason Eval/Treat Not Completed: (P) Patient at procedure or test/unavailable (Pt off floor for HD. Will continue to follow per PT POC.)   Johny Shock 10/25/2022, 2:20 PM

## 2022-10-25 NOTE — Progress Notes (Signed)
Received patient in bed to unit.  Alert and oriented.  Informed consent signed and in chart.   TX duration:3  Patient tolerated well.  Transported back to the room  Alert, without acute distress.  Hand-off given to patient's nurse.   Access used: right AVF Access issues: held sites for 20 post txt  Total UF removed: 0 Medication(s) given: none   10/25/22 1409  Vitals  Temp (!) 97.4 F (36.3 C)  Temp Source Oral  BP (!) 150/102  MAP (mmHg) 116  BP Location Right Arm  BP Method Automatic  Patient Position (if appropriate) Lying  Pulse Rate  (126)  Pulse Rate Source Monitor  ECG Heart Rate (!) 124  Resp 16  Oxygen Therapy  SpO2 100 %  O2 Device Room Air  During Treatment Monitoring  Blood Flow Rate (mL/min) 229 mL/min  Arterial Pressure (mmHg) -63.63 mmHg  Venous Pressure (mmHg) 270.7 mmHg  TMP (mmHg) 10.3 mmHg  Ultrafiltration Rate (mL/min) 338 mL/min  Dialysate Flow Rate (mL/min) 299 ml/min  Dialysate Potassium Concentration 3  Dialysate Calcium Concentration 2.5  Duration of HD Treatment -hour(s) 3 hour(s)  Cumulative Fluid Removed (mL) per Treatment  0.06  HD Safety Checks Performed Yes  Intra-Hemodialysis Comments Tx completed  Dialysis Fluid Bolus Normal Saline  Bolus Amount (mL) 300 mL    Sunny Aguon S [facility]

## 2022-10-26 ENCOUNTER — Inpatient Hospital Stay (HOSPITAL_COMMUNITY): Payer: Medicare PPO

## 2022-10-26 DIAGNOSIS — S069X1A Unspecified intracranial injury with loss of consciousness of 30 minutes or less, initial encounter: Secondary | ICD-10-CM | POA: Diagnosis not present

## 2022-10-26 DIAGNOSIS — L8990 Pressure ulcer of unspecified site, unspecified stage: Secondary | ICD-10-CM | POA: Insufficient documentation

## 2022-10-26 DIAGNOSIS — I4891 Unspecified atrial fibrillation: Secondary | ICD-10-CM

## 2022-10-26 DIAGNOSIS — I5021 Acute systolic (congestive) heart failure: Secondary | ICD-10-CM | POA: Diagnosis not present

## 2022-10-26 DIAGNOSIS — S069X0A Unspecified intracranial injury without loss of consciousness, initial encounter: Secondary | ICD-10-CM

## 2022-10-26 LAB — BASIC METABOLIC PANEL
Anion gap: 16 — ABNORMAL HIGH (ref 5–15)
BUN: 19 mg/dL (ref 8–23)
CO2: 24 mmol/L (ref 22–32)
Calcium: 7.8 mg/dL — ABNORMAL LOW (ref 8.9–10.3)
Chloride: 91 mmol/L — ABNORMAL LOW (ref 98–111)
Creatinine, Ser: 2.72 mg/dL — ABNORMAL HIGH (ref 0.44–1.00)
GFR, Estimated: 17 mL/min — ABNORMAL LOW (ref >60)
Glucose, Bld: 136 mg/dL — ABNORMAL HIGH (ref 70–99)
Potassium: 3.4 mmol/L — ABNORMAL LOW (ref 3.5–5.1)
Sodium: 131 mmol/L — ABNORMAL LOW (ref 135–145)

## 2022-10-26 LAB — GLUCOSE, CAPILLARY
Glucose-Capillary: 145 mg/dL — ABNORMAL HIGH (ref 70–99)
Glucose-Capillary: 121 mg/dL — ABNORMAL HIGH (ref 70–99)
Glucose-Capillary: 101 mg/dL — ABNORMAL HIGH (ref 70–99)
Glucose-Capillary: 194 mg/dL — ABNORMAL HIGH (ref 70–99)

## 2022-10-26 MED ORDER — AMIODARONE HCL 200 MG PO TABS
200.0000 mg | ORAL_TABLET | Freq: Two times a day (BID) | ORAL | Status: DC
  Administered 2022-10-26 – 2022-10-27 (×3): 200 mg via ORAL
  Filled 2022-10-26 (×3): qty 1

## 2022-10-26 MED ORDER — STROKE: EARLY STAGES OF RECOVERY BOOK
Freq: Once | Status: AC
  Filled 2022-10-26: qty 1

## 2022-10-26 NOTE — Progress Notes (Signed)
Progress Note  Patient Name: Kim Burns Date of Encounter: 10/26/2022  Primary Cardiologist: Peter Swazil[place], MD   Subjective   BP 179/81.  Denies any chest pain or dyspnea  Inpatient Medications    Scheduled Meds:  acetaminophen  500 mg Oral TID   Chlorhexidine Gluconate Cloth  6 each Topical Daily   Chlorhexidine Gluconate Cloth  6 each Topical Q0600   insulin aspart  0-6 Units Subcutaneous TID WC   lipase/protease/amylase  36,000 Units Oral BID WC   metoprolol tartrate  25 mg Oral Q12H   prednisoLONE acetate  1 drop Both Eyes BID   rOPINIRole  0.5 mg Oral QHS   sevelamer carbonate  2,400 mg Oral TID WC   valACYclovir  500 mg Oral QHS   Continuous Infusions:  amiodarone 30 mg/hr (10/26/22 0722)   PRN Meds: acetaminophen, cycloSPORINE, docusate sodium, hydrALAZINE, meclizine, olopatadine, ondansetron (ZOFRAN) IV, mouth rinse, polyethylene glycol, rOPINIRole   Vital Signs    Vitals:   10/25/22 2339 10/26/22 0323 10/26/22 0611 10/26/22 0723  BP: (!) 141/81 (!) 165/76  (!) 179/81  Pulse: 100 61 65 69  Resp: 12 17 18 19   Temp: 98.1 F (36.7 C) 98.1 F (36.7 C)  97.8 F (36.6 C)  TempSrc: Oral Oral  Oral  SpO2: 100% 100% 99% 100%  Weight:   52.7 kg   Height:        Intake/Output Summary (Last 24 hours) at 10/26/2022 0943 Last data filed at 10/26/2022 1610 Gross per 24 hour  Intake 578.6 ml  Output 55 ml  Net 523.6 ml   Filed Weights   10/25/22 1022 10/25/22 1506 10/26/22 0611  Weight: 54 kg 55.8 kg 52.7 kg    Telemetry    Converted to sinus rhythm-  Personally Reviewed  ECG    None today  - Personally Reviewed  Physical Exam    GEN: in no acute distress HEENT: normal Neck: + JVD Cardiac: RRR; no murmurs, rubs, or gallops,no edema  Respiratory:  clear to auscultation bilaterally, GI: soft, nontender MS: no deformity or atrophy Skin: warm and dry, no rash Neuro:  Alert and Oriented x 3 Psych: normal affect   Labs    Chemistry Recent Labs   Lab 10/23/22 0845 10/24/22 1536 10/26/22 0314  NA 132* 130* 131*  K 3.8 3.8 3.4*  CL 95* 90* 91*  CO2 23 23 24   GLUCOSE 111* 145* 136*  BUN 46* 32* 19  CREATININE 4.32* 3.59* 2.72*  CALCIUM 7.6* 8.2* 7.8*  GFRNONAA 10* 12* 17*  ANIONGAP 14 17* 16*     Hematology Recent Labs  Lab 10/21/22 0756 10/23/22 0845 10/24/22 1536  WBC 11.3* 8.7 7.7  RBC 3.89 3.46* 4.19  HGB 12.1 10.6* 13.0  HCT 38.6 32.8* 40.4  MCV 99.2 94.8 96.4  MCH 31.1 30.6 31.0  MCHC 31.3 32.3 32.2  RDW 18.0* 18.1* 17.9*  PLT 175 179 195    Cardiac EnzymesNo results for input(s): "TROPONINI" in the last 168 hours. No results for input(s): "TROPIPOC" in the last 168 hours.   BNPNo results for input(s): "BNP", "PROBNP" in the last 168 hours.   DDimer No results for input(s): "DDIMER" in the last 168 hours.   Radiology    MR BRAIN WO CONTRAST  Result Date: 10/26/2022 CLINICAL DATA:  Initial evaluation for neuro deficit, stroke suspected. EXAM: MRI HEAD WITHOUT CONTRAST TECHNIQUE: Multiplanar, multiecho pulse sequences of the brain and surrounding structures were obtained without intravenous contrast. COMPARISON:  Prior CT  from 10/24/2022. FINDINGS: Brain: Examination degraded by motion artifact. Generalized age-related cerebral atrophy. Patchy and confluent T2/FLAIR hyperintensity involving the periventricular white matter, consistent with chronic small vessel ischemic disease, mild for age. Few small remote bilateral cerebellar infarcts noted. 7 mm focus of mild diffusion signal abnormality involving the subcortical posterior right frontal region (series 2, image 30), suspicious for a small acute to a early subacute ischemic infarct possible additional punctate acute to early subacute ischemic infarct noted involving the cortical anterior right frontal convexity (a series 2, image 32). No associated hemorrhage or mass effect. No other evidence for acute or recent infarction. Contrecoup hemorrhagic contusion  involving the anterior right frontal convexity is relatively stable measuring 2.6 x 2.0 x 0.9 cm. Surrounding vasogenic edema without significant regional mass effect. No other acute intracranial hemorrhage. Few scattered chronic micro hemorrhages noted involving the right cerebellum and bilateral cerebral hemispheres, likely small vessel/hypertensive in nature. No mass lesion. Ventricles normal size without hydrocephalus. Pituitary gland suprasellar region within normal limits. Small bilateral subdural collections again seen measuring up to 4 mm bilaterally, relatively similar to previous. Collections are mildly complex with some internal susceptibility artifact, suggesting blood products, more evident on the right. No significant mass effect or midline shift. Basilar cisterns remain patent. Vascular: Loss of normal flow void within the left V4 segment, which could reflect slow flow and/or occlusion (series 5, image 3). Right vertebral artery dominant. Major intracranial vascular flow voids otherwise maintained. Skull and upper cervical spine: Craniocervical junction within normal limits. Bone marrow signal intensity normal. Left occipital scalp contusion noted. No calvarial fracture better seen on prior CT. Sinuses/Orbits: Prior bilateral ocular lens replacement. Globes and orbital soft tissues demonstrate no other acute finding. Paranasal sinuses are largely clear. No significant mastoid effusion. Other: None. IMPRESSION: 1. Stable size and morphology of contrecoup hemorrhagic contusion involving the anterior right frontal convexity. Similar localized edema without significant regional mass effect. 2. 7 mm focus of mild diffusion signal abnormality involving the subcortical posterior right frontal region, suspicious for a small acute to early subacute ischemic nonhemorrhagic infarct. Possible additional punctate infarct involving the anterior right frontal lobe as above. 3. Small bilateral subdural collections  measuring up to 4 mm bilaterally without significant mass effect or midline shift, similar to previous. 4. Loss of normal flow void within the left V4 segment, which could reflect slow flow and/or occlusion. 5. Underlying age-related cerebral atrophy with mild chronic small vessel ischemic disease, with a few small remote bilateral cerebellar infarcts. 6. Evolving left posterior scalp contusion. Known underlying calvarial fracture better seen on prior CT. Electronically Signed   By: Rise Mu M.D.   On: 10/26/2022 04:48   ECHOCARDIOGRAM COMPLETE  Result Date: 10/25/2022    ECHOCARDIOGRAM REPORT   Patient Name:   Kim Burns Date of Exam: 10/25/2022 Medical Rec #:  [id number]        Height:       63.0 in Accession #:    [id number]       Weight:       123.0 lb Date of Birth:  15-Feb-1936        BSA:          1.573 m Patient Age:    87 years         BP:           148/81 mmHg Patient Gender: F                HR:  115 bpm. Exam Location:  Inpatient Procedure: 2D Echo, Color Doppler and Cardiac Doppler Indications:    Syncope  History:        Patient has prior history of Echocardiogram examinations, most                 recent 09/15/2022. CKD/ESRD and Stroke, Arrythmias:Atrial                 Fibrillation; Risk Factors:Hypertension and Diabetes.  Sonographer:    Milbert Coulter Referring Phys: [id number] BELKYS A REGALADO IMPRESSIONS  1. Left ventricular ejection fraction, by estimation, is 45 to 50%. Left ventricular ejection fraction by PLAX is 48 %. The left ventricle has mildly decreased function. The left ventricle demonstrates global hypokinesis. There is mild left ventricular hypertrophy. Left ventricular diastolic function could not be evaluated.  2. Right ventricular systolic function is mildly reduced. The right ventricular size is normal.  3. Left atrial size was severely dilated.  4. Right atrial size was mildly dilated.  5. The mitral valve is abnormal. Mild to moderate mitral valve  regurgitation.  6. The aortic valve is tricuspid. Aortic valve regurgitation is not visualized. Aortic valve sclerosis is present, with no evidence of aortic valve stenosis.  7. The inferior vena cava is normal in size with greater than 50% respiratory variability, suggesting right atrial pressure of 3 mmHg.  8. Rhythm strip during this exam demonstrates atrial fibrillation and with rapid ventricular response. Comparison(s): Changes from prior study are noted. 09/15/2022: LVEF 55-60%. FINDINGS  Left Ventricle: Left ventricular ejection fraction, by estimation, is 45 to 50%. Left ventricular ejection fraction by PLAX is 48 %. The left ventricle has mildly decreased function. The left ventricle demonstrates global hypokinesis. The left ventricular internal cavity size was normal in size. There is mild left ventricular hypertrophy. Left ventricular diastolic function could not be evaluated due to atrial fibrillation. Left ventricular diastolic function could not be evaluated. Right Ventricle: The right ventricular size is normal. No increase in right ventricular wall thickness. Right ventricular systolic function is mildly reduced. Left Atrium: Left atrial size was severely dilated. Right Atrium: Right atrial size was mildly dilated. Pericardium: There is no evidence of pericardial effusion. Mitral Valve: The mitral valve is abnormal. There is mild calcification of the posterior and anterior mitral valve leaflet(s). Mild to moderate mitral annular calcification. Mild to moderate mitral valve regurgitation. Tricuspid Valve: The tricuspid valve is normal in structure. Tricuspid valve regurgitation is not demonstrated. Aortic Valve: The aortic valve is tricuspid. Aortic valve regurgitation is not visualized. Aortic valve sclerosis is present, with no evidence of aortic valve stenosis. Aortic valve mean gradient measures 3.0 mmHg. Aortic valve peak gradient measures 5.3  mmHg. Aortic valve area, by VTI measures 2.26 cm.  Pulmonic Valve: The pulmonic valve was normal in structure. Pulmonic valve regurgitation is not visualized. Aorta: The aortic root and ascending aorta are structurally normal, with no evidence of dilitation. Venous: The inferior vena cava is normal in size with greater than 50% respiratory variability, suggesting right atrial pressure of 3 mmHg. IAS/Shunts: No atrial level shunt detected by color flow Doppler. EKG: Rhythm strip during this exam demonstrates atrial fibrillation and with rapid ventricular response.  LEFT VENTRICLE PLAX 2D LV EF:         Left            Diastology                ventricular     LV e' medial:  12.00 cm/s                ejection        LV E/e' medial:  10.4                fraction by     LV e' lateral:   18.50 cm/s                PLAX is 48      LV E/e' lateral: 6.8                %. LVIDd:         3.80 cm LVIDs:         2.90 cm LV PW:         1.10 cm LV IVS:        1.20 cm LVOT diam:     1.90 cm LV SV:         38 LV SV Index:   24 LVOT Area:     2.84 cm  RIGHT VENTRICLE RV Basal diam:  3.50 cm RV Mid diam:    2.70 cm RV S prime:     12.20 cm/s TAPSE (M-mode): 1.0 cm LEFT ATRIUM             Index        RIGHT ATRIUM           Index LA diam:        4.30 cm 2.73 cm/m   RA Area:     16.50 cm LA Vol (A2C):   64.3 ml 40.88 ml/m  RA Volume:   40.70 ml  25.88 ml/m LA Vol (A4C):   87.9 ml 55.89 ml/m LA Biplane Vol: 75.6 ml 48.07 ml/m  AORTIC VALVE AV Area (Vmax):    2.18 cm AV Area (Vmean):   1.93 cm AV Area (VTI):     2.26 cm AV Vmax:           115.00 cm/s AV Vmean:          85.000 cm/s AV VTI:            0.168 m AV Peak Grad:      5.3 mmHg AV Mean Grad:      3.0 mmHg LVOT Vmax:         88.40 cm/s LVOT Vmean:        58.000 cm/s LVOT VTI:          0.134 m LVOT/AV VTI ratio: 0.80  AORTA Ao Root diam: 2.60 cm MITRAL VALVE MV Area (PHT): 4.29 cm     SHUNTS MV Decel Time: 177 msec     Systemic VTI:  0.13 m MV E velocity: 125.00 cm/s  Systemic Diam: 1.90 cm MV A velocity: 42.30 cm/s MV  E/A ratio:  2.96 Zoila Shutter MD Electronically signed by Zoila Shutter MD Signature Date/Time: 10/25/2022/5:32:24 PM    Final    DG HIP UNILAT WITH PELVIS 2-3 VIEWS LEFT  Result Date: 10/25/2022 CLINICAL DATA:  Pain. EXAM: DG HIP (WITH OR WITHOUT PELVIS) 2-3V LEFT COMPARISON:  Lumbar spine CT 10/21/2022 FINDINGS: The bones are subjectively under mineralized. Irregularity of the left inferior pubic ramus may represent a nondisplaced fracture in the appropriate clinical setting. Known sacral fracture is not demonstrated by radiograph. Slight hip joint space narrowing and spurring. No erosive change or evidence of avascular necrosis. Pubic symphysis and sacroiliac joints are congruent. There are multiple pelvic phleboliths. Lower extremity arterial vascular calcifications.  IMPRESSION: 1. Irregularity of the left inferior pubic ramus may represent a nondisplaced fracture in the appropriate clinical setting. 2. Mild left hip osteoarthritis. Electronically Signed   By: Narda Rutherford M.D.   On: 10/25/2022 10:00    Cardiac Studies   TTE 09/15/2022 IMPRESSIONS     1. Left ventricular ejection fraction, by estimation, is 55 to 60%. The  left ventricle has normal function. The left ventricle has no regional  wall motion abnormalities. Left ventricular diastolic parameters are  indeterminate.   2. Right ventricular systolic function is mildly reduced. The right  ventricular size is normal. There is severely elevated pulmonary artery  systolic pressure.   3. Left atrial size was severely dilated.   4. Right atrial size was mild to moderately dilated.   5. The mitral valve is degenerative. Mild mitral valve regurgitation.  Moderate mitral annular calcification.   6. Tricuspid valve regurgitation is moderate.   7. The aortic valve is tricuspid. Aortic valve regurgitation is not  visualized.   8. The inferior vena cava is normal in size with greater than 50%  respiratory variability, suggesting right  atrial pressure of 3 mmHg.   FINDINGS   Left Ventricle: Left ventricular ejection fraction, by estimation, is 55  to 60%. The left ventricle has normal function. The left ventricle has no  regional wall motion abnormalities. The left ventricular internal cavity  size was normal in size. There is   no left ventricular hypertrophy. Left ventricular diastolic parameters  are indeterminate.   Right Ventricle: The right ventricular size is normal. Right ventricular  systolic function is mildly reduced. There is severely elevated pulmonary  artery systolic pressure. The tricuspid regurgitant velocity is 3.90 m/s,  and with an assumed right atrial  pressure of 3 mmHg, the estimated right ventricular systolic pressure is  63.8 mmHg.   Left Atrium: Left atrial size was severely dilated.   Right Atrium: Right atrial size was mild to moderately dilated.   Pericardium: There is no evidence of pericardial effusion.   Mitral Valve: The mitral valve is degenerative in appearance. Moderate  mitral annular calcification. Mild mitral valve regurgitation.   Tricuspid Valve: The tricuspid valve is normal in structure. Tricuspid  valve regurgitation is moderate.   Aortic Valve: The aortic valve is tricuspid. Aortic valve regurgitation is  not visualized.   Pulmonic Valve: The pulmonic valve was normal in structure. Pulmonic valve  regurgitation is not visualized.   Aorta: The aortic root and ascending aorta are structurally normal, with  no evidence of dilitation.   Venous: The inferior vena cava is normal in size with greater than 50%  respiratory variability, suggesting right atrial pressure of 3 mmHg.   IAS/Shunts: No atrial level shunt detected by color flow Doppler.    Patient Profile     87 y.o. female  with a hx of Hypertension, PAF  formerly on AC (dc after spontaneous renal hematoma), HTN, s/p whipple + (pancreatic mass, no malignancy, high grady dysplasia), RCC s/p embo/ ablation  in 2023 and ESRD on hemodialysis, CAD mild to moderate tricuspid admitted for R frontal hemorrhagic contusion, non-displaced L parietal and occipital fx, L posterior scalp hematoma and now in atrial fibrillation with rvr.   Assessment & Plan      Atrial fibrillation with RVR: Developed A-fib with RVR, started on IV amiodarone 9/4; she is on amiodarone as outpatient to maintain sinus rhythm.  Not on anticoagulation in setting of hemorrhagic cerebral contusion as well as history of spontaneous  renal hematoma.  Echocardiogram shows EF 45 to 50%, mild RV dysfunction, severe left atrial enlargement, mild right atrial enlargement, mild to moderate MR.   -Continue metoprolol 25 mg twice daily -Converted to sinus rhythm, will switch to PO amio  Acute systolic heart failure: EF 45 to 50% on echo as above.  Likely due to A-fib with RVR.  Will plan to reevaluate systolic function as outpatient once maintaining sinus rhythm.  Continue metoprolol  TBI: Presented with skull fracture, scalp hematoma, hemorrhagic cerebral contusion.  Per neurosurgery hold aspirin x 1 week.  Holding anticoagulation for A-fib as above head CT has been stable  ESRD: On HD   For questions or updates, please contact [facility] Please consult [email or web address] for contact info under Cardiology/STEMI.      Signed, Little Ishikawa, MD  10/26/2022, 9:43 AM

## 2022-10-26 NOTE — Consult Note (Signed)
Orthopedic Consultation Note  Current Hospital Day : Hospital Day: 6  Reason For Consult: Possible left pubic rami fractures  History of Present Illness:  Kim Burns is a 87 y.o. female who presented to the hospital last week due to an unwitnessed fall.  She was initially admitted to the ICU for traumatic brain injury and has been improving and is now on the floor.  She is complaining of some left hip pain and radiographs showed a possible left inferior pubic rami fracture.  She was also noted to have a small distal sacral fracture.  Presently she endorses some left hip pain but no sacral pain.  She has no numbness or tingling in her left lower extremity  Past Medical History:  Diagnosis Date   Anemia of chronic disease    takes iron   Anxiety    Blood transfusion without reported diagnosis    Bradycardia    Diabetes mellitus without complication (HCC)    became diabetic after Whipple procedure   Diarrhea    Dysrhythmia 04/16/2016   bradycardia due to medication    ESRD on hemodialysis [facility])    M-W-F   GERD (gastroesophageal reflux disease)    Gout    Headache    History of kidney stones 06/2013   Hyperparathyroidism (HCC)    Hypertension    PONV (postoperative nausea and vomiting)    Sleep apnea    no cpap machine. could not tolerate   Stroke (HCC) 04/16/2016   TIA 1995   Vitamin D deficiency     Past Surgical History:  Procedure Laterality Date   A/V FISTULAGRAM Left 08/30/2021   Procedure: A/V Fistulagram;  Surgeon: Cephus Shelling, MD;  Location: [facility] INVASIVE CV LAB;  Service: Cardiovascular;  Laterality: Left;   ABDOMINAL HYSTERECTOMY  1985   complete   BACK SURGERY  1980   lower   BASCILIC VEIN TRANSPOSITION Left 04/24/2017   Procedure: LEFT ARM FIRST STAGE BASILIC VEIN TRANSPOSITION;  Surgeon: Nada Libman, MD;  Location: [facility] OR;  Service: Vascular;  Laterality: Left;   BASCILIC VEIN TRANSPOSITION Left 07/10/2017   Procedure: SECOND STAGE BASILIC  VEIN TRANSPOSITION LEFT ARM;  Surgeon: Nada Libman, MD;  Location: [facility] OR;  Service: Vascular;  Laterality: Left;   BREAST LUMPECTOMY Left x 2   many years apart, benign   CHOLECYSTECTOMY  1985   CYSTOSCOPY W/ URETERAL STENT PLACEMENT Left 10/31/2021   Procedure: CYSTOSCOPY WITH RETROGRADE PYELOGRAM/URETERAL STENT PLACEMENT;  Surgeon: Crist Fat, MD;  Location: [facility] OR;  Service: Urology;  Laterality: Left;   CYSTOSCOPY WITH URETEROSCOPY AND STENT PLACEMENT Left 06/18/2013   Procedure: CYSTOSCOPY WITH Lef URETEROSCOPY AND Left STENT PLACEMENT;  Surgeon: Crecencio Mc, MD;  Location: [facility] ORS;  Service: Urology;  Laterality: Left;   ESOPHAGOGASTRODUODENOSCOPY (EGD) WITH PROPOFOL N/A 11/13/2016   Procedure: ESOPHAGOGASTRODUODENOSCOPY (EGD) WITH PROPOFOL;  Surgeon: Kathi Der, MD;  Location: [facility] ENDOSCOPY;  Service: Gastroenterology;  Laterality: N/A;   EUS N/A 02/07/2016   Procedure: ESOPHAGEAL ENDOSCOPIC ULTRASOUND (EUS) RADIAL;  Surgeon: Willis Modena, MD;  Location: [facility] ENDOSCOPY;  Service: Endoscopy;  Laterality: N/A;   EYE SURGERY Bilateral 2014   ioc for catracts    FOOT SURGERY Left 1990   something with toes unsure what    HERNIA REPAIR  2018   IR CATHETER TUBE CHANGE  03/14/2022   IR EMBO TUMOR ORGAN ISCHEMIA INFARCT INC GUIDE ROADMAPPING  08/22/2021   IR NEPHROSTOMY EXCHANGE LEFT  06/27/2022   IR NEPHROSTOMY EXCHANGE  LEFT  08/26/2022   IR NEPHROSTOMY EXCHANGE LEFT  10/22/2022   IR NEPHROSTOMY PLACEMENT LEFT  03/14/2022   IR RADIOLOGIST EVAL & MGMT  05/18/2021   IR RADIOLOGIST EVAL & MGMT  10/01/2021   IR RADIOLOGIST EVAL & MGMT  11/12/2021   IR RADIOLOGIST EVAL & MGMT  11/22/2021   IR RADIOLOGIST EVAL & MGMT  11/27/2021   IR RADIOLOGIST EVAL & MGMT  12/20/2021   IR RENAL SUPRASEL UNI S&I MOD SED  08/22/2021   IR US GUIDE VASC ACCESS RIGHT  08/22/2021   LEFT HEART CATH AND CORONARY ANGIOGRAPHY N/A 08/27/2021   Procedure: LEFT HEART CATH AND CORONARY ANGIOGRAPHY;  Surgeon: Kathleene Hazel, MD;  Location: [facility] INVASIVE CV LAB;  Service: Cardiovascular;  Laterality: N/A;   PARATHYROID EXPLORATION     PERIPHERAL VASCULAR BALLOON ANGIOPLASTY Left 08/30/2021   Procedure: PERIPHERAL VASCULAR BALLOON ANGIOPLASTY;  Surgeon: Cephus Shelling, MD;  Location: [facility] INVASIVE CV LAB;  Service: Cardiovascular;  Laterality: Left;  arm fistula   RADIOLOGY WITH ANESTHESIA Left 08/22/2021   Procedure: MICROWAVE ABLATION;  Surgeon: Simonne Come, MD;  Location: [facility] ORS;  Service: Anesthesiology;  Laterality: Left;   WHIPPLE PROCEDURE N/A 04/23/2016   Procedure: WHIPPLE PROCEDURE;  Surgeon: Almond Lint, MD;  Location: [facility] OR;  Service: General;  Laterality: N/A;    Prior to Admission medications   Medication Sig Start Date End Date Taking? Authorizing Provider  amiodarone (PACERONE) 200 MG tablet Take 1 tablet (200 mg total) by mouth daily. 10/01/22  Yes Monge, Petra Kuba, NP  B Complex-C-Zn-Folic Acid (DIALYVITE 800 WITH ZINC) 0.8 MG TABS Take 1 tablet by mouth daily.   Yes [provider]  colestipol (COLESTID) 1 g tablet Take 1 g by mouth See admin instructions. Take 1g (1 tablet) by mouth on MWF before dialysis in addition to 1g (1 tablet) as needed for diarrhea.   Yes [provider]  diclofenac Sodium (VOLTAREN) 1 % GEL Apply 2 g topically 3 (three) times daily as needed (pain). 09/24/22  Yes Rodolph Bong, MD  diphenhydramine-acetaminophen (TYLENOL PM) 25-500 MG TABS tablet Take 2 tablets by mouth at bedtime.   Yes [provider]  folic acid (FOLVITE) 1 MG tablet Take 1 tablet (1 mg total) by mouth daily. Patient taking differently: Take 800 mcg by mouth daily. 01/31/22  Yes Elgergawy, Leana Roe, MD  insulin aspart (NOVOLOG) 100 UNIT/ML injection Inject 0-6 Units into the skin 3 (three) times daily with meals. Patient taking differently: Inject 0-6 Units into the skin See admin instructions. Only if sugar is >200 for at least an hour 01/30/22  Yes Elgergawy,  Leana Roe, MD  lidocaine-prilocaine (EMLA) cream Apply 1 Application topically every Monday, Wednesday, and Friday with hemodialysis.   Yes [provider]  lipase/protease/amylase (CREON) 36000 UNITS CPEP capsule Take 36,000-72,000 Units by mouth See admin instructions. Take 72,000 units (2 capsules) by mouth with meals and 36,000 units (1 capsule) with snacks.   Yes [provider]  Menthol-Methyl Salicylate (MUSCLE RUB) 10-15 % CREA Apply 1 Application topically as needed for muscle pain. 09/24/22  Yes Rodolph Bong, MD  metoprolol tartrate (LOPRESSOR) 50 MG tablet Take 1 tablet (50 mg total) by mouth 2 (two) times daily. 09/24/22  Yes Rodolph Bong, MD  midodrine (PROAMATINE) 10 MG tablet Take 10 mg on dialysis days Mon-Wed-Fri 06/13/22  Yes [place], Peter M, MD  olopatadine (PATADAY) 0.1 % ophthalmic solution Place 1 drop into both eyes 2 (two)  times daily as needed for allergies.   Yes [provider]  rOPINIRole (REQUIP) 0.25 MG tablet Take 1 tablet (0.25 mg total) by mouth 2 (two) times daily. Patient taking differently: Take 0.25-0.5 mg by mouth See admin instructions. Take 0.5mg  (1 tablet) by mouth every night at bedtime and 0.25mg  (1/2 tablet) in the mornings as needed. 09/24/22  Yes Rodolph Bong, MD  SENSIPAR 30 MG tablet Take 30 mg by mouth every Monday, Wednesday, and Friday. 12/18/12  Yes [provider]  sevelamer carbonate (RENVELA) 800 MG tablet Take 1,600-2,400 mg by mouth See admin instructions. Take 2,400mg  (3 tablets) by mouth with meals and 1,600mg  (2 tablets) with snacks   Yes [provider]  valACYclovir (VALTREX) 500 MG tablet Take 1 tablet (500 mg total) by mouth at bedtime. 09/24/22  Yes Rodolph Bong, MD  Brinzolamide-Brimonidine 1-0.2 % SUSP Apply 1 drop to eye 2 (two) times daily. Patient not taking: Reported on 10/22/2022    [provider]  erythromycin ophthalmic ointment Place into the left eye 2 (two)  times daily. Patient not taking: Reported on 10/22/2022 09/24/22   Rodolph Bong, MD  prednisoLONE acetate (PRED FORTE) 1 % ophthalmic suspension Place 1 drop into the left eye 2 (two) times daily. Patient not taking: Reported on 10/22/2022 09/24/22   Rodolph Bong, MD  RESTASIS 0.05 % ophthalmic emulsion Place 1 drop into both eyes as needed (yes irratation). Patient not taking: Reported on 10/22/2022 11/06/21   [provider]    Physical Examination Left lower extremity: On my exam she does not have pain with passive range of motion of the hip or logroll  Motor is intact with ankle dorsiflexion and plantarflexion Sensation intact to light touch SP, DP, T Foot warm and well-perfused  Imaging: X-rays and CT scan of the pelvis reviewed interpreted demonstrating isolated, minimally displaced inferior pubic rami fracture.  I cannot appreciate any superior rami or sacral ala involvement.  Assessment:   Kim Burns is a 87 y.o. female with left inferior pubic rami fracture.  I discussed with her and her son that this is sling that should heal well on its own.  It does not require any precautions or surgery.  She may weight-bear as tolerated.  Her pain should resolve over the next 3 to 4 weeks.  I should see her back for radiographs in that timeframe as well.  Plan:   Weightbearing as tolerated, walker if needed Outpatient follow-up in 3 weeks  Rosalee Kaufman, MD Orthopaedic Surgery [address] [place] [phone]

## 2022-10-26 NOTE — Progress Notes (Signed)
PROGRESS NOTE    Kim Burns  HQI:[id number] DOB: Jul 02, 1935 DOA: 10/21/2022 PCP: Lewis Moccasin, MD   Brief Narrative: 87 year old past medical history significant for Whipple procedure, small bowel resection, due to nonmalignant pancreatic mass, RCC with left renal embolization and ablation with nephrostomy tube in place, hypertension, A-fib not on anticoagulation, ESRD on hemodialysis MWF, her son-in-law went to pick her up for hemodialysis on 9/2 and found her asleep on the floor.  She does not remember falling.  Evaluation in the ED patient was found to have a right frontal hemorrhagic contusion and nondisplaced left parietal and occipital fracture, left posterior scalp hematoma, small nondisplaced S4 fracture.  Patient admitted for traumatic brain injury after a fall with apparent loss of consciousness versus mechanical fall with intact consciousness.  Patient was evaluated by neurosurgery, admitted to the ICU,  systolic blood pressure recommended 90-160.  Assessment & Plan:   Principal Problem:   Traumatic brain injury [facility]) Active Problems:   Contusion of right cerebral hemisphere [facility])   Closed skull fracture (HCC)   Sacral fracture, closed (HCC)   Exocrine pancreatic insufficiency   Hypertension  1-TBI, right frontal contrecoup hemorrhagic cerebral contusion -Nondisplaced left parietal and left occipital skull fracture -Scalp hematoma -Skull fracture: -CT head is stable.  Per neurosurgery continue to hold aspirin for 1 week, may start Lovenox for DVT prophylaxis in 72 hours from 9/3 -Awaiting PT OT recommendation -had some confusion during course hospitalization.  -Patient became more confuse 9/04. Repeated CT head 9/05 showed stable hemorrhagic contusion and two small hygroma. -report dizziness, and lower extremity weakness, will proceed with MRI brain instead of CT scan.  -Less confused. Report dizziness with movement of head, 9/06. -On try tylenol for pain, family  concern with tramadol causing Hallucination. Discontinue tramadol.   Acute/subacute ischemic nonhemorrhagic Infarct:  -MRI brain:  9/06. 7 mm focus of mild diffusion signal abnormality involving the subcortical posterior right frontal region, suspicious for a small acute to early subacute ischemic nonhemorrhagic infarct. Possible additional punctate infarct involving the anterior right frontal lobe as above. -ECHO: EF 45-50 % global hypokinesis. Per cardiology likely related to A fib. Out patient evaluation.  -neurology consulted   A fib RVR;  New acute systolic, Heart failure.  Develops A fib RVR, started on IV amiodarone on 9/04.  Appreciate cardiology evaluation.  Metoprolol resume 9/06 On  with IV amiodarone. Transition to oral per cardiology  No anticoagulation due to prior history of bleeding pre nephrostomy tube and now new hemorrhagic cerebral contusion.   -Hypertension/hypotension -She gets midodrine with hemodialysis, currently on hold  ESRD on hemodialysis -MWF Appreciate nephrology assistance   RCC s/p embolization/ablation, status post left nephrostomy tube Status post nephrostomy tube exchange on Status post left nephrostomy tube exchange 9/3   S/ post Whipple 2018 Exocrine pancreatic insufficiency  IDDM:  -SSI  Frequent fall.  left inferior pubic ramus may represent a nondisplaced fracture Subtle nondisplaced fracture of the anterior cortex of the sacrum at S4. Pain management.  PT/OT recommend CIR Report hip pain: x ray: Irregularity of the left inferior pubic ramus may represent a nondisplaced fracture in the appropriate clinical setting.  Mild left hip osteoarthritis MRI; acute , sub acute stroke.   ECHO> reduce EF 45 % Discussed with ortho, plan to check CT pelvis.   Restless leg Syndrome:  Resume home dose requip.   Estimated body mass index is 20.58 kg/m as calculated from the following:   Height as of this encounter: 5\' 3"  (  1.6 m).   Weight as  of this encounter: 52.7 kg.   DVT prophylaxis: SCD Code Status: Full code Family Communication: Son over phone 9/06 Disposition Plan:  Status is: Inpatient Remains inpatient appropriate because: management of cerebral contusion.     Consultants:  Neurosurgery Nephrology   Procedures:  none  Antimicrobials:    Subjective: She is alert, feels weak, no significant leg pain. She is able to move her left leg better.  Not eating much  Objective: Vitals:   10/25/22 2339 10/26/22 0323 10/26/22 0611 10/26/22 0723  BP: (!) 141/81 (!) 165/76  (!) 179/81  Pulse: 100 61 65 69  Resp: 12 17 18 19   Temp: 98.1 F (36.7 C) 98.1 F (36.7 C)  97.8 F (36.6 C)  TempSrc: Oral Oral  Oral  SpO2: 100% 100% 99% 100%  Weight:   52.7 kg   Height:        Intake/Output Summary (Last 24 hours) at 10/26/2022 0919 Last data filed at 10/26/2022 4098 Gross per 24 hour  Intake 578.6 ml  Output 55 ml  Net 523.6 ml   Filed Weights   10/25/22 1022 10/25/22 1506 10/26/22 0611  Weight: 54 kg 55.8 kg 52.7 kg    Examination:  General exam: NAD Respiratory system: CTA Cardiovascular system: S 1. S 2 RRR Gastrointestinal system: BS [present, soft,  Central nervous system: sleepy, wake up answer questions.  Extremities: No edema  Data Reviewed: I have personally reviewed following labs and imaging studies  CBC: Recent Labs  Lab 10/21/22 0756 10/23/22 0845 10/24/22 1536  WBC 11.3* 8.7 7.7  NEUTROABS 9.5*  --   --   HGB 12.1 10.6* 13.0  HCT 38.6 32.8* 40.4  MCV 99.2 94.8 96.4  PLT 175 179 195   Basic Metabolic Panel: Recent Labs  Lab 10/21/22 0756 10/23/22 0603 10/23/22 0845 10/24/22 1536 10/26/22 0314  NA 132* 133* 132* 130* 131*  K 3.9 5.1 3.8 3.8 3.4*  CL 98 95* 95* 90* 91*  CO2 20* 19* 23 23 24   GLUCOSE 153* 114* 111* 145* 136*  BUN 40* 45* 46* 32* 19  CREATININE 5.37* 4.34* 4.32* 3.59* 2.72*  CALCIUM 8.3* 8.1* 7.6* 8.2* 7.8*  PHOS  --  8.6* 8.2*  --   --     GFR: Estimated Creatinine Clearance: 12.5 mL/min (A) (by C-G formula based on SCr of 2.72 mg/dL (H)). Liver Function Tests: No results for input(s): "AST", "ALT", "ALKPHOS", "BILITOT", "PROT", "ALBUMIN" in the last 168 hours. No results for input(s): "LIPASE", "AMYLASE" in the last 168 hours. No results for input(s): "AMMONIA" in the last 168 hours. Coagulation Profile: No results for input(s): "INR", "PROTIME" in the last 168 hours. Cardiac Enzymes: No results for input(s): "CKTOTAL", "CKMB", "CKMBINDEX", "TROPONINI" in the last 168 hours. BNP (last 3 results) No results for input(s): "PROBNP" in the last 8760 hours. HbA1C: No results for input(s): "HGBA1C" in the last 72 hours. CBG: Recent Labs  Lab 10/24/22 2107 10/25/22 0601 10/25/22 1609 10/25/22 2112 10/26/22 0608  GLUCAP 165* 147* 115* 161* 145*   Lipid Profile: No results for input(s): "CHOL", "HDL", "LDLCALC", "TRIG", "CHOLHDL", "LDLDIRECT" in the last 72 hours. Thyroid Function Tests: No results for input(s): "TSH", "T4TOTAL", "FREET4", "T3FREE", "THYROIDAB" in the last 72 hours. Anemia Panel: No results for input(s): "VITAMINB12", "FOLATE", "FERRITIN", "TIBC", "IRON", "RETICCTPCT" in the last 72 hours. Sepsis Labs: No results for input(s): "PROCALCITON", "LATICACIDVEN" in the last 168 hours.  Recent Results (from the past 240 hour(s))  MRSA Next Gen by PCR, Nasal     Status: None   Collection Time: 10/21/22 12:32 PM   Specimen: Nasal Mucosa; Nasal Swab  Result Value Ref Range Status   MRSA by PCR Next Gen NOT DETECTED NOT DETECTED Final    Comment: (NOTE) The GeneXpert MRSA Assay (FDA approved for NASAL specimens only), is one component of a comprehensive MRSA colonization surveillance program. It is not intended to diagnose MRSA infection nor to guide or monitor treatment for MRSA infections. Test performance is not FDA approved in patients less than 30 years old. Performed at [facility]: [facility], [address], [place], [place] [address]          Radiology Studies: MR BRAIN WO CONTRAST  Result Date: 10/26/2022 CLINICAL DATA:  Initial evaluation for neuro deficit, stroke suspected. EXAM: MRI HEAD WITHOUT CONTRAST TECHNIQUE: Multiplanar, multiecho pulse sequences of the brain and surrounding structures were obtained without intravenous contrast. COMPARISON:  Prior CT from 10/24/2022. FINDINGS: Brain: Examination degraded by motion artifact. Generalized age-related cerebral atrophy. Patchy and confluent T2/FLAIR hyperintensity involving the periventricular white matter, consistent with chronic small vessel ischemic disease, mild for age. Few small remote bilateral cerebellar infarcts noted. 7 mm focus of mild diffusion signal abnormality involving the subcortical posterior right frontal region (series 2, image 30), suspicious for a small acute to a early subacute ischemic infarct possible additional punctate acute to early subacute ischemic infarct noted involving the cortical anterior right frontal convexity (a series 2, image 32). No associated hemorrhage or mass effect. No other evidence for acute or recent infarction. Contrecoup hemorrhagic contusion involving the anterior right frontal convexity is relatively stable measuring 2.6 x 2.0 x 0.9 cm. Surrounding vasogenic edema without significant regional mass effect. No other acute intracranial hemorrhage. Few scattered chronic micro hemorrhages noted involving the right cerebellum and bilateral cerebral hemispheres, likely small vessel/hypertensive in nature. No mass lesion. Ventricles normal size without hydrocephalus. Pituitary gland suprasellar region within normal limits. Small bilateral subdural collections again seen measuring up to 4 mm bilaterally, relatively similar to previous. Collections are mildly complex with some internal susceptibility artifact, suggesting blood products, more evident on the right. No significant mass effect or midline  shift. Basilar cisterns remain patent. Vascular: Loss of normal flow void within the left V4 segment, which could reflect slow flow and/or occlusion (series 5, image 3). Right vertebral artery dominant. Major intracranial vascular flow voids otherwise maintained. Skull and upper cervical spine: Craniocervical junction within normal limits. Bone marrow signal intensity normal. Left occipital scalp contusion noted. No calvarial fracture better seen on prior CT. Sinuses/Orbits: Prior bilateral ocular lens replacement. Globes and orbital soft tissues demonstrate no other acute finding. Paranasal sinuses are largely clear. No significant mastoid effusion. Other: None. IMPRESSION: 1. Stable size and morphology of contrecoup hemorrhagic contusion involving the anterior right frontal convexity. Similar localized edema without significant regional mass effect. 2. 7 mm focus of mild diffusion signal abnormality involving the subcortical posterior right frontal region, suspicious for a small acute to early subacute ischemic nonhemorrhagic infarct. Possible additional punctate infarct involving the anterior right frontal lobe as above. 3. Small bilateral subdural collections measuring up to 4 mm bilaterally without significant mass effect or midline shift, similar to previous. 4. Loss of normal flow void within the left V4 segment, which could reflect slow flow and/or occlusion. 5. Underlying age-related cerebral atrophy with mild chronic small vessel ischemic disease, with a few small remote bilateral cerebellar infarcts. 6. Evolving left posterior scalp contusion.  Known underlying calvarial fracture better seen on prior CT. Electronically Signed   By: Rise Mu M.D.   On: 10/26/2022 04:48   ECHOCARDIOGRAM COMPLETE  Result Date: 10/25/2022    ECHOCARDIOGRAM REPORT   Patient Name:   Kim Burns Date of Exam: 10/25/2022 Medical Rec #:  [id number]        Height:       63.0 in Accession #:    [id number]        Weight:       123.0 lb Date of Birth:  1935/10/09        BSA:          1.573 m Patient Age:    87 years         BP:           148/81 mmHg Patient Gender: F                HR:           115 bpm. Exam Location:  Inpatient Procedure: 2D Echo, Color Doppler and Cardiac Doppler Indications:    Syncope  History:        Patient has prior history of Echocardiogram examinations, most                 recent 09/15/2022. CKD/ESRD and Stroke, Arrythmias:Atrial                 Fibrillation; Risk Factors:Hypertension and Diabetes.  Sonographer:    Milbert Coulter Referring Phys: [id number] Ilea Hilton A Jeselle Hiser IMPRESSIONS  1. Left ventricular ejection fraction, by estimation, is 45 to 50%. Left ventricular ejection fraction by PLAX is 48 %. The left ventricle has mildly decreased function. The left ventricle demonstrates global hypokinesis. There is mild left ventricular hypertrophy. Left ventricular diastolic function could not be evaluated.  2. Right ventricular systolic function is mildly reduced. The right ventricular size is normal.  3. Left atrial size was severely dilated.  4. Right atrial size was mildly dilated.  5. The mitral valve is abnormal. Mild to moderate mitral valve regurgitation.  6. The aortic valve is tricuspid. Aortic valve regurgitation is not visualized. Aortic valve sclerosis is present, with no evidence of aortic valve stenosis.  7. The inferior vena cava is normal in size with greater than 50% respiratory variability, suggesting right atrial pressure of 3 mmHg.  8. Rhythm strip during this exam demonstrates atrial fibrillation and with rapid ventricular response. Comparison(s): Changes from prior study are noted. 09/15/2022: LVEF 55-60%. FINDINGS  Left Ventricle: Left ventricular ejection fraction, by estimation, is 45 to 50%. Left ventricular ejection fraction by PLAX is 48 %. The left ventricle has mildly decreased function. The left ventricle demonstrates global hypokinesis. The left ventricular internal cavity  size was normal in size. There is mild left ventricular hypertrophy. Left ventricular diastolic function could not be evaluated due to atrial fibrillation. Left ventricular diastolic function could not be evaluated. Right Ventricle: The right ventricular size is normal. No increase in right ventricular wall thickness. Right ventricular systolic function is mildly reduced. Left Atrium: Left atrial size was severely dilated. Right Atrium: Right atrial size was mildly dilated. Pericardium: There is no evidence of pericardial effusion. Mitral Valve: The mitral valve is abnormal. There is mild calcification of the posterior and anterior mitral valve leaflet(s). Mild to moderate mitral annular calcification. Mild to moderate mitral valve regurgitation. Tricuspid Valve: The tricuspid valve is normal in structure. Tricuspid valve regurgitation is not demonstrated. Aortic Valve: The aortic  valve is tricuspid. Aortic valve regurgitation is not visualized. Aortic valve sclerosis is present, with no evidence of aortic valve stenosis. Aortic valve mean gradient measures 3.0 mmHg. Aortic valve peak gradient measures 5.3  mmHg. Aortic valve area, by VTI measures 2.26 cm. Pulmonic Valve: The pulmonic valve was normal in structure. Pulmonic valve regurgitation is not visualized. Aorta: The aortic root and ascending aorta are structurally normal, with no evidence of dilitation. Venous: The inferior vena cava is normal in size with greater than 50% respiratory variability, suggesting right atrial pressure of 3 mmHg. IAS/Shunts: No atrial level shunt detected by color flow Doppler. EKG: Rhythm strip during this exam demonstrates atrial fibrillation and with rapid ventricular response.  LEFT VENTRICLE PLAX 2D LV EF:         Left            Diastology                ventricular     LV e' medial:    12.00 cm/s                ejection        LV E/e' medial:  10.4                fraction by     LV e' lateral:   18.50 cm/s                 PLAX is 48      LV E/e' lateral: 6.8                %. LVIDd:         3.80 cm LVIDs:         2.90 cm LV PW:         1.10 cm LV IVS:        1.20 cm LVOT diam:     1.90 cm LV SV:         38 LV SV Index:   24 LVOT Area:     2.84 cm  RIGHT VENTRICLE RV Basal diam:  3.50 cm RV Mid diam:    2.70 cm RV S prime:     12.20 cm/s TAPSE (M-mode): 1.0 cm LEFT ATRIUM             Index        RIGHT ATRIUM           Index LA diam:        4.30 cm 2.73 cm/m   RA Area:     16.50 cm LA Vol (A2C):   64.3 ml 40.88 ml/m  RA Volume:   40.70 ml  25.88 ml/m LA Vol (A4C):   87.9 ml 55.89 ml/m LA Biplane Vol: 75.6 ml 48.07 ml/m  AORTIC VALVE AV Area (Vmax):    2.18 cm AV Area (Vmean):   1.93 cm AV Area (VTI):     2.26 cm AV Vmax:           115.00 cm/s AV Vmean:          85.000 cm/s AV VTI:            0.168 m AV Peak Grad:      5.3 mmHg AV Mean Grad:      3.0 mmHg LVOT Vmax:         88.40 cm/s LVOT Vmean:        58.000 cm/s LVOT VTI:  0.134 m LVOT/AV VTI ratio: 0.80  AORTA Ao Root diam: 2.60 cm MITRAL VALVE MV Area (PHT): 4.29 cm     SHUNTS MV Decel Time: 177 msec     Systemic VTI:  0.13 m MV E velocity: 125.00 cm/s  Systemic Diam: 1.90 cm MV A velocity: 42.30 cm/s MV E/A ratio:  2.96 Zoila Shutter MD Electronically signed by Zoila Shutter MD Signature Date/Time: 10/25/2022/5:32:24 PM    Final    DG HIP UNILAT WITH PELVIS 2-3 VIEWS LEFT  Result Date: 10/25/2022 CLINICAL DATA:  Pain. EXAM: DG HIP (WITH OR WITHOUT PELVIS) 2-3V LEFT COMPARISON:  Lumbar spine CT 10/21/2022 FINDINGS: The bones are subjectively under mineralized. Irregularity of the left inferior pubic ramus may represent a nondisplaced fracture in the appropriate clinical setting. Known sacral fracture is not demonstrated by radiograph. Slight hip joint space narrowing and spurring. No erosive change or evidence of avascular necrosis. Pubic symphysis and sacroiliac joints are congruent. There are multiple pelvic phleboliths. Lower extremity arterial vascular  calcifications. IMPRESSION: 1. Irregularity of the left inferior pubic ramus may represent a nondisplaced fracture in the appropriate clinical setting. 2. Mild left hip osteoarthritis. Electronically Signed   By: Narda Rutherford M.D.   On: 10/25/2022 10:00        Scheduled Meds:  acetaminophen  500 mg Oral TID   Chlorhexidine Gluconate Cloth  6 each Topical Daily   Chlorhexidine Gluconate Cloth  6 each Topical Q0600   insulin aspart  0-6 Units Subcutaneous TID WC   lipase/protease/amylase  36,000 Units Oral BID WC   metoprolol tartrate  25 mg Oral Q12H   prednisoLONE acetate  1 drop Both Eyes BID   rOPINIRole  0.5 mg Oral QHS   sevelamer carbonate  2,400 mg Oral TID WC   valACYclovir  500 mg Oral QHS   Continuous Infusions:  amiodarone 30 mg/hr (10/26/22 0722)     LOS: 5 days    Time spent: 35 minutes    Jimeka Balan A Redmond Whittley, MD [facility]   If 7PM-7AM, please contact night-coverage [email or web address]  10/26/2022, 9:19 AM

## 2022-10-26 NOTE — Consult Note (Signed)
Neurology Consultation  Reason for Consult: Right frontal lobe stroke seen on MRI Referring Physician: Dr. Sunnie Nielsen  CC: None  History is obtained from: Patient and chart  HPI: Kim Burns is a 87 y.o. 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  Patient has undergone brain MRI since then and was found to have a small stroke in the posterior right frontal region as well as a possible additional punctate infarct in anterior right frontal lobe.  Given recent traumatic brain injury, anterior infarct may in fact simply be diffusion abnormality due to patient's TBI, but posterior frontal lobe lesion does appear to be a stroke.  Patient states that she has had TIAs in the past and that she had some left leg weakness prior to her fall.   LKW: Unclear TNK given?: no, completed stroke on MRI IR Thrombectomy? No, completed stroke on MRI Modified Rankin Scale: 2-Slight disability-UNABLE to perform all activities but does not need assistance  ROS: A complete ROS was performed and is negative except as noted in the HPI.    Past Medical History:  Diagnosis Date   Anemia of chronic disease    takes iron   Anxiety    Blood transfusion without reported diagnosis    Bradycardia    Diabetes mellitus without complication (HCC)    became diabetic after Whipple procedure   Diarrhea    Dysrhythmia 04/16/2016   bradycardia due to medication    ESRD on hemodialysis [facility])    M-W-F   GERD (gastroesophageal reflux disease)    Gout    Headache    History of kidney stones 06/2013   Hyperparathyroidism (HCC)    Hypertension    PONV (postoperative  nausea and vomiting)    Sleep apnea    no cpap machine. could not tolerate   Stroke (HCC) 04/16/2016   TIA 1995   Vitamin D deficiency      Family History  Problem Relation Age of Onset   Heart disease Mother    Stroke Mother    Cancer Father        colon   Alzheimer's disease Sister    Alzheimer's disease Sister    Cancer Brother        brain   Breast cancer Neg Hx      Social History:   reports that she has never smoked. She has never used smokeless tobacco. She reports that she does not drink alcohol and does not use drugs.  Medications  Current Facility-Administered Medications:    acetaminophen (TYLENOL) tablet 500 mg, 500 mg, Oral, TID, Regalado, Belkys A, MD, 500 mg at 10/26/22 0839   acetaminophen (TYLENOL) tablet 650 mg, 650 mg, Oral, Q6H PRN, Bowser, Kaylyn Layer, NP, 650 mg at 10/26/22 0242   amiodarone (PACERONE) tablet 200 mg, 200 mg, Oral, BID, Little Ishikawa, MD, 200 mg at 10/26/22 1157   Chlorhexidine Gluconate Cloth 2 % PADS 6 each, 6 each, Topical, Daily, Leslye Peer, MD, 6 each at 10/26/22 0842   Chlorhexidine Gluconate Cloth 2 % PADS 6 each, 6 each, Topical, Q0600, Delano Metz, MD, 6 each at 10/26/22 0537   cycloSPORINE (RESTASIS) 0.05 % ophthalmic emulsion 1 drop, 1 drop,  Both Eyes, Daily PRN, Bowser, Kaylyn Layer, NP   docusate sodium (COLACE) capsule 100 mg, 100 mg, Oral, BID PRN, Bowser, Kaylyn Layer, NP   hydrALAZINE (APRESOLINE) injection 10 mg, 10 mg, Intravenous, Q4H PRN, Bowser, Kaylyn Layer, NP, 10 mg at 10/22/22 1613   insulin aspart (novoLOG) injection 0-6 Units, 0-6 Units, Subcutaneous, TID WC, Bowser, Kaylyn Layer, NP   lipase/protease/amylase (CREON) capsule 36,000 Units, 36,000 Units, Oral, BID WC, Bowser, Kaylyn Layer, NP, 36,000 Units at 10/22/22 1613   meclizine (ANTIVERT) tablet 12.5 mg, 12.5 mg, Oral, TID PRN, Regalado, Belkys A, MD, 12.5 mg at 10/26/22 0838   metoprolol tartrate (LOPRESSOR) tablet 25 mg, 25 mg, Oral, Q12H, Tobb, Kardie, DO, 25 mg  at 10/26/22 0839   olopatadine (PATANOL) 0.1 % ophthalmic solution 1 drop, 1 drop, Both Eyes, Daily PRN, Bowser, Grace E, NP   ondansetron (ZOFRAN) injection 4 mg, 4 mg, Intravenous, Q6H PRN, Bowser, Kaylyn Layer, NP   Oral care mouth rinse, 15 mL, Mouth Rinse, PRN, Byrum, Les Pou, MD   polyethylene glycol (MIRALAX / GLYCOLAX) packet 17 g, 17 g, Oral, Daily PRN, Bowser, Kaylyn Layer, NP   prednisoLONE acetate (PRED FORTE) 1 % ophthalmic suspension 1 drop, 1 drop, Both Eyes, BID, Bowser, Grace E, NP, 1 drop at 10/24/22 2122   rOPINIRole (REQUIP) tablet 0.25 mg, 0.25 mg, Oral, Daily PRN, Regalado, Belkys A, MD, 0.25 mg at 10/26/22 0838   rOPINIRole (REQUIP) tablet 0.5 mg, 0.5 mg, Oral, QHS, Regalado, Belkys A, MD, 0.5 mg at 10/25/22 2002   sevelamer carbonate (RENVELA) tablet 2,400 mg, 2,400 mg, Oral, TID WC, Delano Metz, MD   valACYclovir (VALTREX) tablet 500 mg, 500 mg, Oral, QHS, Bowser, Kaylyn Layer, NP, 500 mg at 10/24/22 2121   Exam: Current vital signs: BP (!) 151/75 (BP Location: Right Arm)   Pulse (!) 52   Temp 97.9 F (36.6 C) (Oral)   Resp 16   Ht 5\' 3"  (1.6 m)   Wt 52.7 kg   SpO2 99%   BMI 20.58 kg/m  Vital signs in last 24 hours: Temp:  [97.4 F (36.3 C)-98.1 F (36.7 C)] 97.9 F (36.6 C) (09/07 1044) Pulse Rate:  [52-124] 52 (09/07 1044) Resp:  [12-20] 16 (09/07 1044) BP: (130-179)/(75-105) 151/75 (09/07 1044) SpO2:  [97 %-100 %] 99 % (09/07 1044) Weight:  [52.7 kg-55.8 kg] 52.7 kg (09/07 0611)  GENERAL: Awake, alert, in no acute distress Psych: Affect appropriate for situation, patient is calm and cooperative with examination Head: Normocephalic and atraumatic, without obvious abnormality EENT: Normal conjunctivae, dry mucous membranes, no OP obstruction LUNGS: Normal respiratory effort. Non-labored breathing on room air CV: Regular rate and rhythm on telemetry Extremities: warm, well perfused, without obvious deformity  NEURO:  Mental Status: Awake, alert, and oriented  to person, place, time, and situation. She is able to provide a clear and coherent history of present illness. Speech/Language: speech is clear and fluent.   No neglect is noted Cranial Nerves:  II: PERRL visual fields full.  III, IV, VI: EOMI. Lid elevation symmetric and full.  V: Sensation is intact to light touch and symmetrical to face.  VII: Subtle left facial droop VIII: Hearing intact to voice IX, X: Phonation normal.  XI: Normal sternocleidomastoid and trapezius muscle strength XII: Tongue protrudes midline without fasciculations.   Motor: Able to move all 4 extremities with good antigravity strength, left leg weaker than right Tone is normal. Bulk is normal.  Sensation: Intact to light touch bilaterally in all  four extremities. No extinction to DSS present.  Coordination: FTN intact bilaterally. No pronator drift.   Gait: Deferred  NIHSS: 1a Level of Conscious.: 0 1b LOC Questions: 0 1c LOC Commands: 0 2 Best Gaze: 0 3 Visual: 0 4 Facial Palsy: 1 5a Motor Arm - left: 0 5b Motor Arm - Right: 0 6a Motor Leg - Left: 0 6b Motor Leg - Right: 0 7 Limb Ataxia: 0 8 Sensory: 0 9 Best Language: 0 10 Dysarthria: 0 11 Extinct. and Inatten.: 0 TOTAL: 1   Labs I have reviewed labs in epic and the results pertinent to this consultation are:   CBC    Component Value Date/Time   WBC 7.7 10/24/2022 1536   RBC 4.19 10/24/2022 1536   HGB 13.0 10/24/2022 1536   HGB 10.2 (L) 06/21/2022 1204   HCT 40.4 10/24/2022 1536   HCT 28.0 (L) 10/05/2016 1310   PLT 195 10/24/2022 1536   PLT 311 06/21/2022 1204   MCV 96.4 10/24/2022 1536   MCH 31.0 10/24/2022 1536   MCHC 32.2 10/24/2022 1536   RDW 17.9 (H) 10/24/2022 1536   LYMPHSABS 1.0 10/21/2022 0756   MONOABS 0.7 10/21/2022 0756   EOSABS 0.1 10/21/2022 0756   BASOSABS 0.0 10/21/2022 0756    CMP     Component Value Date/Time   NA 131 (L) 10/26/2022 0314   K 3.4 (L) 10/26/2022 0314   CL 91 (L) 10/26/2022 0314   CO2 24  10/26/2022 0314   GLUCOSE 136 (H) 10/26/2022 0314   BUN 19 10/26/2022 0314   CREATININE 2.72 (H) 10/26/2022 0314   CREATININE 2.37 (H) 06/21/2022 1204   CALCIUM 7.8 (L) 10/26/2022 0314   CALCIUM 7.6 (L) 02/23/2021 1204   PROT 4.8 (L) 09/18/2022 0250   PROT 6.1 02/25/2022 1426   ALBUMIN 2.3 (L) 09/23/2022 0801   ALBUMIN 3.3 (L) 02/25/2022 1426   AST 12 (L) 09/18/2022 0250   AST 19 06/21/2022 1204   ALT 30 09/18/2022 0250   ALT 21 06/21/2022 1204   ALKPHOS 48 09/18/2022 0250   BILITOT 0.7 09/18/2022 0250   BILITOT 0.3 06/21/2022 1204   GFRNONAA 17 (L) 10/26/2022 0314   GFRNONAA 20 (L) 06/21/2022 1204   GFRAA 13 (L) 11/12/2019 1240    Lipid Panel     Component Value Date/Time   CHOL 95 08/26/2021 0447   CHOL 133 08/09/2021 1143   TRIG 110 08/26/2021 0447   HDL 35 (L) 08/26/2021 0447   HDL 43 08/09/2021 1143   CHOLHDL 2.7 08/26/2021 0447   VLDL 22 08/26/2021 0447   LDLCALC 38 08/26/2021 0447   LDLCALC 68 08/09/2021 1143     Imaging I have reviewed the images obtained:  CT-scan of the brain 9/2: Positive for acute coup contrecoup brain injury with nondisplaced left posterior skull fracture and right anterior frontal lobe hemorrhagic contusion  CT head 9/3: No progression of right frontal hematoma  CT head 9/5: Small new left greater than right subdural hematomas have developed with trace rightward midline shift, stable right anterior frontal lobe hemorrhagic contusion and stable nondisplaced left occipital bone fracture  MRI examination of the brain: Stable size and morphology acute contrecoup hemorrhagic contusion involving anterior right frontal convexity, 7 mm focus of mild diffusion signal abnormality involving subcortical posterior right frontal region, likely small acute or early subacute not ischemic infarct, possible additional punctate infarct involving right anterior frontal lobe, small bilateral subdural collections measuring up to 4 mm bilaterally, loss of flow  void within  left V4 segment, underlying age-related cerebral atrophy and mild chronic small vessel ischemic disease  Assessment: 87 year old patient with history of Whipple procedure due to nonmalignant pancreatic mass, renal cell carcinoma with left renal embolization and ablation, hypertension, A-fib not on anticoagulation due to spontaneous renal bleed and end-stage renal disease on dialysis presented originally due to unwitnessed fall at home and was found to have hemorrhagic contusion in right anterior frontal lobe.  Later on MRI, she was found to have a small acute ischemic stroke in posterior right frontal region with additional possible punctate infarct involving right anterior frontal lobe.  Diffusion abnormality in right frontal lobe may simply be due to patient's hemorrhagic contusion, but diffusion abnormality in posterior frontal lobe does resemble a stroke.  Patient will need full stroke workup.  Will hold off on DAPT per neurosurgery recommendations.  Impression: Acute ischemic stroke, likely cardioembolic in origin due to atrial fibrillation off anticoagulation  Recommendations: Stroke/TIA Workup  - MRA and carotid ultrasound - Check A1c and LDL + add statin per guidelines.  Goal LDL less than 70 -Hold off on antiplt/anticoag per neurosurgery recommendations -Could consider Watchman procedure given contraindications for anticoagulation - q4 hr neuro checks - STAT head CT for any change in neuro exam - Tele - PT/OT/SLP - Stroke education - Amb referral to neurology upon discharge    Pt seen by NP/Neuro and later by MD. Note/plan to be edited by MD as needed.  Cortney E Ernestina Columbia , MSN, AGACNP-BC [facility] See Amion for schedule and pager information 10/26/2022 11:59 AM   ATTENDING ATTESTATION:  87 year old with ischemic stroke likely cardioembolic in the setting of history of atrial fibrillation off anticoagulation due to spontaneous renal bleeding with  Eliquis and hemorrhagic contusion from a fall.  Complete stroke workup as above.  Stroke team to follow tomorrow  Plan discussed with patient as well as to family members in the room.  Dr. Viviann Spare evaluated pt independently, reviewed imaging, chart, labs. Discussed and formulated plan with the Resident/APP. Changes were made to the note where appropriate. Please see APP/resident note above for details.     Lyra Alaimo,MD

## 2022-10-26 NOTE — Evaluation (Signed)
Speech Language Pathology Evaluation Patient Details Name: RINDI MENESES MRN: [id number] DOB: Jun 06, 1935 Today's Date: 10/26/2022 Time: 3474-2595 SLP Time Calculation (min) (ACUTE ONLY): 20 min  Problem List:  Patient Active Problem List   Diagnosis Date Noted   Acute systolic heart failure (HCC) 10/26/2022   Atrial fibrillation, rapid (HCC) 10/26/2022   Pressure injury of skin 10/26/2022   Traumatic brain injury (HCC) 10/21/2022   Contusion of right cerebral hemisphere (HCC) 10/21/2022   Closed skull fracture (HCC) 10/21/2022   Sacral fracture, closed (HCC) 10/21/2022   Exocrine pancreatic insufficiency 10/21/2022   Hypertension 10/21/2022   Hypomagnesemia 09/18/2022   RLS (restless legs syndrome) 09/18/2022   HSV (herpes simplex virus) dendritic keratitis 09/18/2022   Transaminitis 09/18/2022   Atrial fibrillation with RVR (HCC) 09/14/2022   Essential hypertension 09/14/2022   Erythropoietin (EPO) stimulating agent anemia management patient 07/16/2022   Hematoma of left kidney 01/22/2022   Kidney, perinephric abscess 11/02/2021   Bacteremia due to Streptococcus 11/01/2021   Lactic acidosis 10/30/2021   Fever 10/30/2021   Severe sepsis (HCC) 10/29/2021   Hydronephrosis of left kidney 10/29/2021   Chronic diastolic CHF (congestive heart failure) (HCC) 10/29/2021   Coronary artery disease involving native coronary artery of native heart without angina pectoris 10/29/2021   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) 10/29/2021   Chest pain of uncertain etiology    Pulmonary nodule 08/26/2021   Paroxysmal atrial fibrillation (HCC) 08/26/2021   End-stage renal disease on hemodialysis (HCC) 08/25/2021   Renal cell carcinoma of left kidney (HCC) 08/22/2021   DM2 (diabetes mellitus, type 2) (HCC) 11/14/2016   Hypokalemia 11/12/2016   Melena    History of anemia due to chronic kidney disease    Acute renal failure (ARF) (HCC) 10/03/2016   GERD (gastroesophageal reflux  disease) 04/29/2016   Hyperglycemia 04/29/2016   IPMN (intraductal papillary mucinous neoplasm) 04/23/2016   History of stroke 04/16/2016   Sepsis (HCC) 06/18/2013   Localized swelling, mass and lump, neck 03/29/2013   Sinus bradycardia 09/18/2012   Fatigue 09/18/2012   Past Medical History:  Past Medical History:  Diagnosis Date   Anemia of chronic disease    takes iron   Anxiety    Blood transfusion without reported diagnosis    Bradycardia    Diabetes mellitus without complication (HCC)    became diabetic after Whipple procedure   Diarrhea    Dysrhythmia 04/16/2016   bradycardia due to medication    ESRD on hemodialysis (HCC)    M-W-F   GERD (gastroesophageal reflux disease)    Gout    Headache    History of kidney stones 06/2013   Hyperparathyroidism (HCC)    Hypertension    PONV (postoperative nausea and vomiting)    Sleep apnea    no cpap machine. could not tolerate   Stroke (HCC) 04/16/2016   TIA 1995   Vitamin D deficiency    Past Surgical History:  Past Surgical History:  Procedure Laterality Date   A/V FISTULAGRAM Left 08/30/2021   Procedure: A/V Fistulagram;  Surgeon: Cephus Shelling, MD;  Location: [facility] INVASIVE CV LAB;  Service: Cardiovascular;  Laterality: Left;   ABDOMINAL HYSTERECTOMY  1985   complete   BACK SURGERY  1980   lower   BASCILIC VEIN TRANSPOSITION Left 04/24/2017   Procedure: LEFT ARM FIRST STAGE BASILIC VEIN TRANSPOSITION;  Surgeon: Nada Libman, MD;  Location: [facility] OR;  Service: Vascular;  Laterality: Left;   BASCILIC VEIN TRANSPOSITION Left 07/10/2017   Procedure: SECOND  STAGE BASILIC VEIN TRANSPOSITION LEFT ARM;  Surgeon: Nada Libman, MD;  Location: [facility] OR;  Service: Vascular;  Laterality: Left;   BREAST LUMPECTOMY Left x 2   many years apart, benign   CHOLECYSTECTOMY  1985   CYSTOSCOPY W/ URETERAL STENT PLACEMENT Left 10/31/2021   Procedure: CYSTOSCOPY WITH RETROGRADE PYELOGRAM/URETERAL STENT PLACEMENT;  Surgeon: Crist Fat, MD;  Location[facility] OR;  Service: Urology;  Laterality: Left;   CYSTOSCOPY WITH URETEROSCOPY AND STENT PLACEMENT Left 06/18/2013   Procedure: CYSTOSCOPY WITH Lef URETEROSCOPY AND Left STENT PLACEMENT;  Surgeon: Crecencio Mc, MD;  Location: [facility] ORS;  Service: Urology;  Laterality: Left;   ESOPHAGOGASTRODUODENOSCOPY (EGD) WITH PROPOFOL N/A 11/13/2016   Procedure: ESOPHAGOGASTRODUODENOSCOPY (EGD) WITH PROPOFOL;  Surgeon: Kathi Der, MD;  Location: [facility] ENDOSCOPY;  Service: Gastroenterology;  Laterality: N/A;   EUS N/A 02/07/2016   Procedure: ESOPHAGEAL ENDOSCOPIC ULTRASOUND (EUS) RADIAL;  Surgeon: Willis Modena, MD;  Location: [facility] ENDOSCOPY;  Service: Endoscopy;  Laterality: N/A;   EYE SURGERY Bilateral 2014   ioc for catracts    FOOT SURGERY Left 1990   something with toes unsure what    HERNIA REPAIR  2018   IR CATHETER TUBE CHANGE  03/14/2022   IR EMBO TUMOR ORGAN ISCHEMIA INFARCT INC GUIDE ROADMAPPING  08/22/2021   IR NEPHROSTOMY EXCHANGE LEFT  06/27/2022   IR NEPHROSTOMY EXCHANGE LEFT  08/26/2022   IR NEPHROSTOMY EXCHANGE LEFT  10/22/2022   IR NEPHROSTOMY PLACEMENT LEFT  03/14/2022   IR RADIOLOGIST EVAL & MGMT  05/18/2021   IR RADIOLOGIST EVAL & MGMT  10/01/2021   IR RADIOLOGIST EVAL & MGMT  11/12/2021   IR RADIOLOGIST EVAL & MGMT  11/22/2021   IR RADIOLOGIST EVAL & MGMT  11/27/2021   IR RADIOLOGIST EVAL & MGMT  12/20/2021   IR RENAL SUPRASEL UNI S&I MOD SED  08/22/2021   IR US GUIDE VASC ACCESS RIGHT  08/22/2021   LEFT HEART CATH AND CORONARY ANGIOGRAPHY N/A 08/27/2021   Procedure: LEFT HEART CATH AND CORONARY ANGIOGRAPHY;  Surgeon: Kathleene Hazel, MD;  Location: [facility] INVASIVE CV LAB;  Service: Cardiovascular;  Laterality: N/A;   PARATHYROID EXPLORATION     PERIPHERAL VASCULAR BALLOON ANGIOPLASTY Left 08/30/2021   Procedure: PERIPHERAL VASCULAR BALLOON ANGIOPLASTY;  Surgeon: Cephus Shelling, MD;  Location: [facility] INVASIVE CV LAB;  Service: Cardiovascular;  Laterality: Left;  arm  fistula   RADIOLOGY WITH ANESTHESIA Left 08/22/2021   Procedure: MICROWAVE ABLATION;  Surgeon: Simonne Come, MD;  Location: [facility] ORS;  Service: Anesthesiology;  Laterality: Left;   WHIPPLE PROCEDURE N/A 04/23/2016   Procedure: WHIPPLE PROCEDURE;  Surgeon: Almond Lint, MD;  Location: [facility] OR;  Service: General;  Laterality: N/A;   HPI:  Patient is an 87 y.o. female with PMH:  Whipple and small bowel resection due to nonmalignant pancreatic mass, RCC with left renal embolization ablation with a nephrostomy tube in place, hypertension, atrial fibrillation (not on [facility]), end-stage renal disease on HD MWF.  She lives with her son. She presented to the hospital on 10/21/22 after being found asleep on the floor in the kitchen. In ED, she was found to have right frontal hemorrhagic contusion, a nondisplaced left parietal and occipital fracture, left posterior scalp hematoma, small nondisplaced S4 fracture. At baseline, she lives with her son, has hearing impairment, is a somewhat poor historian. MRI brain showed an acute/subacute ischemic nonhemorrhagic infarct involving the subcortical posterior right frontal region.   Assessment / Plan / Recommendation Clinical Impression  Patient presents  with a mild cognitive impairment as per this evaluation but after discussion with patient and her son, she is likely close to her baseline. Patient was oriented x4. She demonstrated good safety awareness and did not appear impulsive. She was drowsy and said she hadn't slept much but her son reported that at home, she sleeps 4-5 hours then is awake 4-5 hours and it "doesn't matter if day or night" so she will often be awake in middle of night. He also reported that she sometimes burns toast or a pot when she is cooking but that happens "1 out of every 15 times". Patient was able to recall some medical interventions but she had some difficulty recalling specific information overall. SLP is recommending assessment and intervention as  needed at next venue of care.    SLP Assessment  SLP Recommendation/Assessment: All further Speech Lanaguage Pathology  needs can be addressed in the next venue of care SLP Visit Diagnosis: Cognitive communication deficit (R41.841)    Recommendations for follow up therapy are one component of a multi-disciplinary discharge planning process, led by the attending physician.  Recommendations may be updated based on patient status, additional functional criteria and insurance authorization.    Follow Up Recommendations  Acute inpatient rehab (3hours/day)    Assistance Recommended at Discharge  Frequent or constant Supervision/Assistance  Functional Status Assessment Patient has had a recent decline in their functional status and demonstrates the ability to make significant improvements in function in a reasonable and predictable amount of time.  Frequency and Duration           SLP Evaluation Cognition  Overall Cognitive Status: Impaired/Different from baseline Arousal/Alertness: Lethargic Orientation Level: Oriented to person;Oriented to place;Oriented to time;Oriented to situation Year: 2024 Month: September Day of Week: Correct Attention: Sustained Sustained Attention: Appears intact Memory: Impaired Memory Impairment: Decreased recall of new information Awareness: Appears intact Safety/Judgment: Appears intact       Comprehension  Auditory Comprehension Overall Auditory Comprehension: Appears within functional limits for tasks assessed    Expression Expression Primary Mode of Expression: Verbal Verbal Expression Overall Verbal Expression: Appears within functional limits for tasks assessed   Oral / Motor  Oral Motor/Sensory Function Overall Oral Motor/Sensory Function: Within functional limits Motor Speech Overall Motor Speech: Appears within functional limits for tasks assessed           Angela Nevin, MA, CCC-SLP Speech Therapy

## 2022-10-26 NOTE — Progress Notes (Signed)
  [facility] Progress Note   Subjective:   Patient seen in room. No /co's today.   Objective Vitals:   10/26/22 0611 10/26/22 0723 10/26/22 1044 10/26/22 1608  BP:  (!) 179/81 (!) 151/75   Pulse: 65 69 (!) 52   Resp: 18 19 16    Temp:  97.8 F (36.6 C) 97.9 F (36.6 C) 98.1 F (36.7 C)  TempSrc:  Oral Oral Axillary  SpO2: 99% 100% 99%   Weight: 52.7 kg     Height:       Physical Exam General: Frail-appearing elderly female lying supine on hospital bed.  Heart:RRR, no M/R/G Lungs: clear bilat  Abdomen: soft, non-tender, non-distended.  Extremities: Trace pretib edema  Dialysis Access: LUA AVF +thrill   OP HD:  [facility] MWF   3.5h   55kg   350/1.5   2/2 bath  L AVF  Heparin none - last dialyzed on 8/30, post wt 54.5kg.   Assessment/Plan:  # Frontal Contusion: Managed by neurosurgery. F/u CT was normal. She seems to be improved.   # ESRD - MWF HD. Cont MWF here. HD Monday .   # Hypertension: BP is improved since admit. On po metoprolol here. BP's stable.   # Volume - a bit under dry wt, euvolemic on exam.   # Anemia of CKD: Trend CBCs. Doing well at present.   #  Hyperglycemia: Managed by CC team. On sliding scale.    Vinson Moselle  MD  [facility] 10/26/2022, 5:34 PM  Recent Labs  Lab 10/23/22 0603 10/23/22 0845 10/24/22 1536 10/26/22 0314  HGB  --  10.6* 13.0  --   CALCIUM 8.1* 7.6* 8.2* 7.8*  PHOS 8.6* 8.2*  --   --   CREATININE 4.34* 4.32* 3.59* 2.72*  K 5.1 3.8 3.8 3.4*    Inpatient medications:  [START ON 10/27/2022]  stroke: early stages of recovery book   Does not apply Once   acetaminophen  500 mg Oral TID   amiodarone  200 mg Oral BID   Chlorhexidine Gluconate Cloth  6 each Topical Daily   Chlorhexidine Gluconate Cloth  6 each Topical Q0600   insulin aspart  0-6 Units Subcutaneous TID WC   lipase/protease/amylase  36,000 Units Oral BID WC   metoprolol tartrate  25 mg Oral Q12H   prednisoLONE acetate  1 drop Both Eyes BID   rOPINIRole  0.5  mg Oral QHS   sevelamer carbonate  2,400 mg Oral TID WC   valACYclovir  500 mg Oral QHS     acetaminophen, cycloSPORINE, docusate sodium, hydrALAZINE, meclizine, olopatadine, ondansetron (ZOFRAN) IV, mouth rinse, polyethylene glycol, rOPINIRole

## 2022-10-26 NOTE — Progress Notes (Signed)
Inpatient Rehab Admissions:  Inpatient Rehab Consult received.  I met with patient and son [name] at the bedside for rehabilitation assessment and to discuss goals and expectations of an inpatient rehab admission.  Discussed average length of stay, insurance authorization requirement, discharge home after completion of CIR. Also discussed awaiting updated therapy notes to help determine if CIR is an appropriate rehab venue for pt. Both acknowledged understanding. Pt and [name] would like to discuss rehab options with other family prior to making a decision regarding CIR.  Will continue to follow.  Signed: Wolfgang Phoenix, MS, CCC-SLP Admissions Coordinator [phone]

## 2022-10-27 ENCOUNTER — Inpatient Hospital Stay (HOSPITAL_COMMUNITY): Payer: Medicare PPO

## 2022-10-27 DIAGNOSIS — I4891 Unspecified atrial fibrillation: Secondary | ICD-10-CM | POA: Diagnosis not present

## 2022-10-27 DIAGNOSIS — I639 Cerebral infarction, unspecified: Secondary | ICD-10-CM | POA: Diagnosis not present

## 2022-10-27 DIAGNOSIS — I5021 Acute systolic (congestive) heart failure: Secondary | ICD-10-CM | POA: Diagnosis not present

## 2022-10-27 DIAGNOSIS — R569 Unspecified convulsions: Secondary | ICD-10-CM

## 2022-10-27 DIAGNOSIS — I6381 Other cerebral infarction due to occlusion or stenosis of small artery: Secondary | ICD-10-CM | POA: Diagnosis not present

## 2022-10-27 LAB — LIPID PANEL
Cholesterol: 104 mg/dL (ref 0–200)
HDL: 49 mg/dL (ref >40)
LDL Cholesterol: 38 mg/dL (ref 0–99)
Total CHOL/HDL Ratio: 2.1 ratio
Triglycerides: 84 mg/dL (ref <150)
VLDL: 17 mg/dL (ref 0–40)

## 2022-10-27 LAB — BASIC METABOLIC PANEL
Anion gap: 16 — ABNORMAL HIGH (ref 5–15)
BUN: 30 mg/dL — ABNORMAL HIGH (ref 8–23)
CO2: 25 mmol/L (ref 22–32)
Calcium: 7.8 mg/dL — ABNORMAL LOW (ref 8.9–10.3)
Chloride: 91 mmol/L — ABNORMAL LOW (ref 98–111)
Creatinine, Ser: 3.59 mg/dL — ABNORMAL HIGH (ref 0.44–1.00)
GFR, Estimated: 12 mL/min — ABNORMAL LOW (ref >60)
Glucose, Bld: 123 mg/dL — ABNORMAL HIGH (ref 70–99)
Potassium: 3.4 mmol/L — ABNORMAL LOW (ref 3.5–5.1)
Sodium: 132 mmol/L — ABNORMAL LOW (ref 135–145)

## 2022-10-27 LAB — GLUCOSE, CAPILLARY
Glucose-Capillary: 101 mg/dL — ABNORMAL HIGH (ref 70–99)
Glucose-Capillary: 106 mg/dL — ABNORMAL HIGH (ref 70–99)
Glucose-Capillary: 149 mg/dL — ABNORMAL HIGH (ref 70–99)
Glucose-Capillary: 150 mg/dL — ABNORMAL HIGH (ref 70–99)

## 2022-10-27 LAB — HEMOGLOBIN A1C
Hgb A1c MFr Bld: 6.1 % — ABNORMAL HIGH (ref 4.8–5.6)
Mean Plasma Glucose: 128.37 mg/dL

## 2022-10-27 MED ORDER — METOPROLOL SUCCINATE ER 25 MG PO TB24
25.0000 mg | ORAL_TABLET | Freq: Every day | ORAL | Status: DC
  Administered 2022-10-28 – 2022-10-30 (×3): 25 mg via ORAL
  Filled 2022-10-27 (×4): qty 1

## 2022-10-27 MED ORDER — ASPIRIN 81 MG PO CHEW
81.0000 mg | CHEWABLE_TABLET | Freq: Every day | ORAL | Status: DC
  Administered 2022-10-27 – 2022-10-30 (×4): 81 mg via ORAL
  Filled 2022-10-27 (×4): qty 1

## 2022-10-27 MED ORDER — AMIODARONE HCL 200 MG PO TABS
200.0000 mg | ORAL_TABLET | Freq: Two times a day (BID) | ORAL | Status: DC
  Administered 2022-10-27 – 2022-10-29 (×5): 200 mg via ORAL
  Filled 2022-10-27 (×5): qty 1

## 2022-10-27 MED ORDER — AMIODARONE HCL 200 MG PO TABS: 200.0000 mg | ORAL_TABLET | Freq: Every day | ORAL | Status: DC

## 2022-10-27 MED ORDER — CHLORHEXIDINE GLUCONATE CLOTH 2 % EX PADS
6.0000 | MEDICATED_PAD | Freq: Every day | CUTANEOUS | Status: DC
  Administered 2022-10-28 – 2022-10-30 (×2): 6 via TOPICAL

## 2022-10-27 NOTE — Progress Notes (Addendum)
STROKE TEAM PROGRESS NOTE   BRIEF HPI [name] is a 87 y.o. 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 Patient has undergone brain MRI since then and was found to have a small stroke in the posterior right frontal region as well as a possible additional punctate infarct in anterior right frontal lobe.    SIGNIFICANT HOSPITAL EVENTS MRI   7 mm focus of mild diffusion signal abnormality involving the subcortical posterior right frontal region, Small bilateral subdural,  hemorrhagic contusion involving the anterior right frontal convexity  INTERIM HISTORY/SUBJECTIVE Patient laying in bed in NAD. No family at the bedside  Patient is unable to state why she is in the hospital and does not remember what happened at home prior to admission   Will check EEG   OBJECTIVE  CBC    Component Value Date/Time   WBC 7.7 10/24/2022 1536   RBC 4.19 10/24/2022 1536   HGB 13.0 10/24/2022 1536   HGB 10.2 (L) 06/21/2022 1204   HCT 40.4 10/24/2022 1536   HCT 28.0 (L) 10/05/2016 1310   PLT 195 10/24/2022 1536   PLT 311 06/21/2022 1204   MCV 96.4 10/24/2022 1536   MCH 31.0 10/24/2022 1536   MCHC 32.2 10/24/2022 1536   RDW 17.9 (H) 10/24/2022 1536   LYMPHSABS 1.0 10/21/2022 0756   MONOABS 0.7 10/21/2022 0756   EOSABS 0.1 10/21/2022 0756   BASOSABS 0.0 10/21/2022 0756    BMET    Component Value Date/Time   NA 132 (L) 10/27/2022 0550   K 3.4 (L) 10/27/2022 0550   CL 91 (L) 10/27/2022 0550   CO2 25 10/27/2022 0550   GLUCOSE 123 (H) 10/27/2022 0550   BUN 30 (H) 10/27/2022 0550   CREATININE 3.59 (H) 10/27/2022 0550   CREATININE 2.37  (H) 06/21/2022 1204   CALCIUM 7.8 (L) 10/27/2022 0550   CALCIUM 7.6 (L) 02/23/2021 1204   GFRNONAA 12 (L) 10/27/2022 0550   GFRNONAA 20 (L) 06/21/2022 1204    IMAGING past 24 hours VAS US CAROTID (at [facility] and [facility] only)  Result Date: 10/27/2022 Carotid Arterial Duplex Study Patient Name:  Kim Burns  Date of Exam:   10/27/2022 Medical Rec #: [id number]         Accession #:    [id number] Date of Birth: August 04, 1935         Patient Gender: F Patient Age:   27 years Exam Location:  [facility] Procedure:      VAS US CAROTID Referring Phys: Dewitt Hoes DE [place] TORRE --------------------------------------------------------------------------------  Indications:      CVA. Risk Factors:     Hypertension, Diabetes, coronary artery disease. History of                   TIA. Other Factors:    Recent TBI. Comparison Study: No prior studies. Performing Technologist: Jean Rosenthal RDMS, RVT  Examination Guidelines: A complete evaluation includes B-mode imaging, spectral Doppler, color Doppler, and power Doppler as needed of all accessible portions of each vessel. Bilateral testing is considered an integral part of a complete examination. Limited examinations for reoccurring indications may be performed as noted.  Right Carotid Findings: +----------+--------+--------+--------+------------------+------------------+  PSV cm/sEDV cm/sStenosisPlaque DescriptionComments           +----------+--------+--------+--------+------------------+------------------+ CCA Prox  64      11                                                   +----------+--------+--------+--------+------------------+------------------+ CCA Distal47      9                                 intimal thickening +----------+--------+--------+--------+------------------+------------------+ ICA Prox  41      9               heterogenous mild                     +----------+--------+--------+--------+------------------+------------------+ ICA Distal56      12                                                   +----------+--------+--------+--------+------------------+------------------+ ECA       52                                                           +----------+--------+--------+--------+------------------+------------------+ +----------+--------+-------+----------------+-------------------+           PSV cm/sEDV cmsDescribe        Arm Pressure (mmHG) +----------+--------+-------+----------------+-------------------+ [id number]             Multiphasic, WNL                    +----------+--------+-------+----------------+-------------------+ +---------+--------+--+--------+-+---------+ VertebralPSV cm/s42EDV cm/s9Antegrade +---------+--------+--+--------+-+---------+  Left Carotid Findings: +----------+--------+--------+--------+------------------+------------------+           PSV cm/sEDV cm/sStenosisPlaque DescriptionComments           +----------+--------+--------+--------+------------------+------------------+ CCA Prox  59      9                                                    +----------+--------+--------+--------+------------------+------------------+ CCA Distal51      11                                intimal thickening +----------+--------+--------+--------+------------------+------------------+ ICA Prox  29      7               heterogenous mild                    +----------+--------+--------+--------+------------------+------------------+ ICA Distal46      12                                                   +----------+--------+--------+--------+------------------+------------------+ ECA  40                                                           +----------+--------+--------+--------+------------------+------------------+  +----------+--------+--------+----------------+-------------------+           PSV cm/sEDV cm/sDescribe        Arm Pressure (mmHG) +----------+--------+--------+----------------+-------------------+ Subclavian130             Multiphasic, WNL                    +----------+--------+--------+----------------+-------------------+ +---------+--------+--+--------+-+---------+ VertebralPSV cm/s33EDV cm/s5Antegrade +---------+--------+--+--------+-+---------+   Summary: Right Carotid: The extracranial vessels were near-normal with only minimal wall                thickening or plaque. Left Carotid: The extracranial vessels were near-normal with only minimal wall               thickening or plaque. Vertebrals:  Bilateral vertebral arteries demonstrate antegrade flow. Subclavians: Normal flow hemodynamics were seen in bilateral subclavian              arteries. *See table(s) above for measurements and observations.  Electronically signed by Delia Heady MD on 10/27/2022 at 12:24:42 PM.    Final    MR ANGIO HEAD WO CONTRAST  Result Date: 10/26/2022 CLINICAL DATA:  Stroke, follow-up.  Traumatic injury. EXAM: MRA HEAD WITHOUT CONTRAST TECHNIQUE: Angiographic images of the Circle of Willis were acquired using MRA technique without intravenous contrast. COMPARISON:  MRI of the head without contrast 10/25/2022 FINDINGS: Anterior circulation: The internal carotid arteries are within normal limits from the high cervical segments through the ICA termini. The A1 and M1 segments are normal. The anterior communicating artery is patent. The ACA and MCA branch vessels are normal bilaterally. Posterior circulation: The right vertebral artery is dominant. The vertebrobasilar junction basilar artery normal. Both posterior cerebral arteries originate from basilar tip. Posterior communicating arteries are patent bilaterally. The PCA branch vessels are within normal limits. Anatomic variants: None IMPRESSION: Normal MRA  circle-of-Willis without evidence for significant proximal stenosis, aneurysm, or branch vessel occlusion. Electronically Signed   By: Marin Roberts M.D.   On: 10/26/2022 19:04    Vitals:   10/27/22 1610 10/27/22 0644 10/27/22 0823 10/27/22 1150  BP:  (!) 162/82 (!) 172/88 (!) 169/69  Pulse:  62 60 63  Resp: 17 20 19 20   Temp:  97.6 F (36.4 C) (!) 97.4 F (36.3 C) 98.4 F (36.9 C)  TempSrc:  Oral Oral Oral  SpO2:  97%  98%  Weight: 52.6 kg     Height:         PHYSICAL EXAM General:  frail elderly woman in no acute distress Psych:  Mood and affect appropriate for situation CV: Regular rate and rhythm on monitor Respiratory:  Regular, unlabored respirations on room air GI: Abdomen soft and nontender   NEURO:  Mental Status: AA&Ox3,Unable to give history on events leading upt to presentation to the hospital  Speech/Language: speech is without dysarthria or aphasia.  Naming, repetition, fluency, and comprehension intact.  Cranial Nerves:  II: PERRL. Visual fields full.  III, IV, VI: EOMI. Eyelids elevate symmetrically.  V: Sensation is intact to light touch and symmetrical to face.  VII: Face is symmetrical resting and smiling VIII: hearing intact to voice. IX, X: Palate elevates  symmetrically. Phonation is normal.  WU:[id number] shrug 5/5. XII: tongue is midline without fasciculations. Motor: 5/5 strength to all muscle groups tested.  Tone: is normal and bulk is normal Sensation- Intact to light touch bilaterally. Extinction absent to light touch to DSS.   Coordination: FTN intact bilaterally, HKS: no ataxia in BLE.No drift.  Gait- deferred   ASSESSMENT/PLAN  Acute Ischemic Infarct: Right rontal tiny subcortical white matter Etiology:  A fib not on AC, as well as small vessel disease   CT head Positive for acute coup contrecoup brain injury with nondisplaced left posterior skull fracture, right anterior frontal lobe hemorrhagic contusion MRI   7 mm focus of mild  diffusion signal abnormality involving the subcortical posterior right frontal region, Small bilateral subdural,  hemorrhagic contusion involving the anterior right frontal convexity MRA  normal  Carotid Doppler   negative  2D Echo EF 45-50%.  LV with global hypokinesis, LA severely dilated, RA mildly dilated  LDL 38 HgbA1c 6.1 VTE prophylaxis - SCD's No antithrombotic prior to admission, now on aspirin 81 mg daily Therapy recommendations:  pending  Disposition:  pending  Hx of Stroke/TIA Prior strokes- 08/2021 scattered punctate infarcts   Atrial fibrillation Home Meds: amio, metop Continue telemetry monitoring Not on AC due to history of spontaneous renal hematoma   Hypertension CHF Home meds:  metop Stable Blood Pressure Goal: BP less than 180/105   Hyperlipidemia Home meds:  none, resumed in hospital LDL 38, goal < 70 Continue statin at discharge  Diabetes type II Controlled Home meds:  insulin HgbA1c 6.1, goal < 7.0 CBGs SSI Recommend close follow-up with PCP for better DM control   Dysphagia Patient has post-stroke dysphagia, SLP consulted    Diet   Diet regular Room service appropriate? Yes with Assist; Fluid consistency: Thin; Fluid restriction: 2000 mL Fluid   Advance diet as tolerated  Other Stroke Risk Factors Coronary artery disease Congestive heart failure Obstructive sleep apnea   Other Active Problems ESRD on HD Anxiety and depression  Gout  GERD Anemia   Hospital day # 6  Gevena Mart DNP, ACNPC-AG  [facility]   STROKE MD NOTE :  I have personally obtained history,examined this patient, reviewed notes, independently viewed imaging studies, participated in medical decision making and plan of care.ROS completed by me personally and pertinent positives fully documented  I have made any additions or clarifications directly to the above note. Agree with note above.  Patient presented with a fall and is unable to remember exactly  what happened.  She sustained a scalp hematoma with right frontal hemorrhagic contusion and bilateral subdural hematoma.  She denies any focal neurological deficits but MRI does show right frontal subcortical white matter infarct which may be from her A-fib or from small vessel disease.  Patient has not been on anticoagulation due to history of spontaneous renal hematoma and now with subdural hematoma and recent unexplained fall she is clearly not a long-term anticoagulation candidate.  Recommend aspirin 81 mg daily and continue ongoing stroke workup and aggressive risk factor modification.  Mobilize out of bed.  Therapy consults.  No family available at the bedside for discussion.  Greater than 50% time during this 50-minute visit was spent in counseling and coordination of care about her fall and small lacunar stroke and atrial fibrillation and discussion of risk-benefit of anticoagulation and answering questions.  Stroke team will sign off.  Kindly call for questions.  Discussed with Dr.Regalado  Delia Heady, MD Medical Director [facility] Pager: [phone] 10/27/2022 3:19 PM   To contact Stroke Continuity provider, please refer to [email or web address]. After hours, contact General Neurology

## 2022-10-27 NOTE — Progress Notes (Signed)
Inpatient Rehab Admissions Coordinator:  Spoke with pt's son [name] on the telephone. He informed AC that family has decided they would like pt to pursue CIR. Discussed that still awaiting updated therapy notes to help determine CIR appropriateness. Will continue to follow.  Wolfgang Phoenix, MS, CCC-SLP Admissions Coordinator [phone]

## 2022-10-27 NOTE — Progress Notes (Signed)
EEG complete - results pending 

## 2022-10-27 NOTE — Progress Notes (Signed)
Carotid duplex bilateral study completed.   Please see CV Proc for preliminary results.   Rachel Hodge, RDMS, RVT  

## 2022-10-27 NOTE — Progress Notes (Signed)
PT Cancellation Note  Patient Details Name: Kim Burns MRN: [id number] DOB: 06/24/1935   Cancelled Treatment:    Reason Eval/Treat Not Completed: Patient declined, stating she needs some rest and doesn't want to see [place] today. Will try again tomorrow.   [name] [facility] 10/27/2022, 9:09 AM Skip Mayer PT Acute [employer] [phone]

## 2022-10-27 NOTE — Plan of Care (Signed)
  Problem: Education: Goal: Ability to describe self-care measures that may prevent or decrease complications (Diabetes Survival Skills Education) will improve Outcome: Progressing Goal: Individualized Educational Video(s) Outcome: Progressing   Problem: Coping: Goal: Ability to adjust to condition or change in health will improve Outcome: Progressing   Problem: Fluid Volume: Goal: Ability to maintain a balanced intake and output will improve Outcome: Progressing   Problem: Health Behavior/Discharge Planning: Goal: Ability to identify and utilize available resources and services will improve Outcome: Progressing Goal: Ability to manage health-related needs will improve Outcome: Progressing   Problem: Metabolic: Goal: Ability to maintain appropriate glucose levels will improve Outcome: Progressing   Problem: Nutritional: Goal: Maintenance of adequate nutrition will improve Outcome: Progressing Goal: Progress toward achieving an optimal weight will improve Outcome: Progressing   Problem: Skin Integrity: Goal: Risk for impaired skin integrity will decrease Outcome: Progressing   Problem: Tissue Perfusion: Goal: Adequacy of tissue perfusion will improve Outcome: Progressing   Problem: Education: Goal: Knowledge of General Education information will improve Description: Including pain rating scale, medication(s)/side effects and non-pharmacologic comfort measures Outcome: Progressing   Problem: Health Behavior/Discharge Planning: Goal: Ability to manage health-related needs will improve Outcome: Progressing   Problem: Clinical Measurements: Goal: Ability to maintain clinical measurements within normal limits will improve Outcome: Progressing Goal: Will remain free from infection Outcome: Progressing Goal: Diagnostic test results will improve Outcome: Progressing Goal: Respiratory complications will improve Outcome: Progressing Goal: Cardiovascular complication will  be avoided Outcome: Progressing   Problem: Activity: Goal: Risk for activity intolerance will decrease Outcome: Progressing   Problem: Nutrition: Goal: Adequate nutrition will be maintained Outcome: Progressing   Problem: Coping: Goal: Level of anxiety will decrease Outcome: Progressing   Problem: Elimination: Goal: Will not experience complications related to bowel motility Outcome: Progressing Goal: Will not experience complications related to urinary retention Outcome: Progressing   Problem: Pain Managment: Goal: General experience of comfort will improve Outcome: Progressing   Problem: Safety: Goal: Ability to remain free from injury will improve Outcome: Progressing   Problem: Skin Integrity: Goal: Risk for impaired skin integrity will decrease Outcome: Progressing   Problem: Education: Goal: Knowledge of disease or condition will improve Outcome: Progressing Goal: Understanding of medication regimen will improve Outcome: Progressing   Problem: Cardiac: Goal: Ability to achieve and maintain adequate cardiopulmonary perfusion will improve Outcome: Progressing   Problem: Health Behavior/Discharge Planning: Goal: Ability to safely manage health-related needs after discharge will improve Outcome: Progressing   Problem: Education: Goal: Knowledge of disease or condition will improve Outcome: Progressing Goal: Knowledge of secondary prevention will improve (MUST DOCUMENT ALL) Outcome: Progressing Goal: Knowledge of patient specific risk factors will improve [name] N/A or DELETE if not current risk factor) Outcome: Progressing   Problem: Ischemic Stroke/TIA Tissue Perfusion: Goal: Complications of ischemic stroke/TIA will be minimized Outcome: Progressing   Problem: Coping: Goal: Will verbalize positive feelings about self Outcome: Progressing Goal: Will identify appropriate support needs Outcome: Progressing   Problem: Health Behavior/Discharge  Planning: Goal: Ability to manage health-related needs will improve Outcome: Progressing Goal: Goals will be collaboratively established with patient/family Outcome: Progressing   Problem: Self-Care: Goal: Ability to participate in self-care as condition permits will improve Outcome: Progressing Goal: Verbalization of feelings and concerns over difficulty with self-care will improve Outcome: Progressing Goal: Ability to communicate needs accurately will improve Outcome: Progressing

## 2022-10-27 NOTE — Progress Notes (Signed)
PROGRESS NOTE    Kim Burns  OZH:[id number] DOB: 11-Feb-1936 DOA: 10/21/2022 PCP: Lewis Moccasin, MD   Brief Narrative: 87 year old past medical history significant for Whipple procedure, small bowel resection, due to nonmalignant pancreatic mass, RCC with left renal embolization and ablation with nephrostomy tube in place, hypertension, A-fib not on anticoagulation, ESRD on hemodialysis MWF, her son-in-law went to pick her up for hemodialysis on 9/2 and found her asleep on the floor.  She does not remember falling.  Evaluation in the ED patient was found to have a right frontal hemorrhagic contusion and nondisplaced left parietal and occipital fracture, left posterior scalp hematoma, small nondisplaced S4 fracture.  Patient admitted for traumatic brain injury after a fall with apparent loss of consciousness versus mechanical fall with intact consciousness.  Patient was evaluated by neurosurgery, admitted to the ICU,  systolic blood pressure recommended 90-160.  Assessment & Plan:   Principal Problem:   Traumatic brain injury [facility]) Active Problems:   Contusion of right cerebral hemisphere [facility])   Closed skull fracture (HCC)   Sacral fracture, closed (HCC)   Exocrine pancreatic insufficiency   Hypertension   Acute systolic heart failure (HCC)   Atrial fibrillation, rapid (HCC)   Pressure injury of skin  1-TBI, right frontal contrecoup hemorrhagic cerebral contusion -Nondisplaced left parietal and left occipital skull fracture -Scalp hematoma -Skull fracture: -CT head is stable.  Per neurosurgery continue to hold aspirin for 1 week, may start Lovenox for DVT prophylaxis in 72 hours from 9/3 -Awaiting PT OT recommendation -had some confusion during course hospitalization.  -Patient became more confuse 9/04. Repeated CT head 9/05 showed stable hemorrhagic contusion and two small hygroma. -report dizziness, and lower extremity weakness, will proceed with MRI brain instead of CT  scan.  -Less confused. Report dizziness with movement of head, 9/06. Meclizine PRN/  -On try tylenol for pain, family concern with tramadol causing Hallucination. Discontinue tramadol.   Acute/subacute ischemic nonhemorrhagic Infarct:  -MRI brain:  9/06. 7 mm focus of mild diffusion signal abnormality involving the subcortical posterior right frontal region, suspicious for a small acute to early subacute ischemic nonhemorrhagic infarct. Possible additional punctate infarct involving the anterior right frontal lobe as above. -ECHO: EF 45-50 % global hypokinesis. Per cardiology likely related to A fib. Out patient evaluation.  -neurology consulted Carotid doppler negative.  No anticoagulation/antiplatelet due to hemorrhagic cerebral contusion   A fib RVR;  New acute systolic, Heart failure.  Develops A fib RVR, started on IV amiodarone on 9/04.  Appreciate cardiology evaluation.  Metoprolol resume 9/06 On oral amiodarone. Needs amiodarone 200 mg BID for 1 week then 200 mg daily.  No anticoagulation due to prior history of bleeding pre nephrostomy tube and now new hemorrhagic cerebral contusion.   -Hypertension/hypotension -She gets midodrine with hemodialysis, currently on hold  ESRD on hemodialysis -MWF Appreciate nephrology assistance   RCC s/p embolization/ablation, status post left nephrostomy tube Status post nephrostomy tube exchange on Status post left nephrostomy tube exchange 9/3   S/ post Whipple 2018 Exocrine pancreatic insufficiency  IDDM:  -SSI  Frequent fall.  left inferior pubic ramus may represent a nondisplaced fracture Subtle nondisplaced fracture of the anterior cortex of the sacrum at S4. Pain management.  PT/OT recommend CIR Report hip pain: x ray: Irregularity of the left inferior pubic ramus may represent a nondisplaced fracture in the appropriate clinical setting.  Mild left hip osteoarthritis MRI; acute , sub acute stroke.   ECHO> reduce EF 45  % CT: Minimally  displaced fracture of the left inferior pubic ramus and probable nondisplaced fracture of the left superior pubic ramus.  Restless leg Syndrome:  Resume home dose requip.   Estimated body mass index is 20.54 kg/m as calculated from the following:   Height as of this encounter: 5\' 3"  (1.6 m).   Weight as of this encounter: 52.6 kg.   DVT prophylaxis: SCD Code Status: Full code Family Communication: Son over phone 9/06 Disposition Plan:  Status is: Inpatient Remains inpatient appropriate because: management of cerebral contusion.     Consultants:  Neurosurgery Nephrology   Procedures:  none  Antimicrobials:    Subjective: She is sleepy, wake up to voice. They dont let her sleep at night. Denies headaches. Still having dizziness with head movement.   Objective: Vitals:   10/26/22 1923 10/26/22 2149 10/27/22 0614 10/27/22 0644  BP: (!) 156/70 (!) 158/74  (!) 162/82  Pulse: 63 60  62  Resp: 15 17 17 20   Temp: 97.7 F (36.5 C) 98.1 F (36.7 C)  97.6 F (36.4 C)  TempSrc: Oral Oral  Oral  SpO2: 100% 100%  97%  Weight:   52.6 kg   Height:        Intake/Output Summary (Last 24 hours) at 10/27/2022 0723 Last data filed at 10/27/2022 0600 Gross per 24 hour  Intake 120 ml  Output 50 ml  Net 70 ml   Filed Weights   10/25/22 1506 10/26/22 0611 10/27/22 0614  Weight: 55.8 kg 52.7 kg 52.6 kg    Examination:  General exam: NAD Respiratory system: CTA Cardiovascular system: S 1, S 2 RRR Gastrointestinal system: BBS present, soft nt Central nervous system: sleepy , wake up answer questions. Follows command Extremities: No edema  Data Reviewed: I have personally reviewed following labs and imaging studies  CBC: Recent Labs  Lab 10/21/22 0756 10/23/22 0845 10/24/22 1536  WBC 11.3* 8.7 7.7  NEUTROABS 9.5*  --   --   HGB 12.1 10.6* 13.0  HCT 38.6 32.8* 40.4  MCV 99.2 94.8 96.4  PLT 175 179 195   Basic Metabolic Panel: Recent Labs  Lab  10/23/22 0603 10/23/22 0845 10/24/22 1536 10/26/22 0314 10/27/22 0550  NA 133* 132* 130* 131* 132*  K 5.1 3.8 3.8 3.4* 3.4*  CL 95* 95* 90* 91* 91*  CO2 19* 23 23 24 25   GLUCOSE 114* 111* 145* 136* 123*  BUN 45* 46* 32* 19 30*  CREATININE 4.34* 4.32* 3.59* 2.72* 3.59*  CALCIUM 8.1* 7.6* 8.2* 7.8* 7.8*  PHOS 8.6* 8.2*  --   --   --    GFR: Estimated Creatinine Clearance: 9.5 mL/min (A) (by C-G formula based on SCr of 3.59 mg/dL (H)). Liver Function Tests: No results for input(s): "AST", "ALT", "ALKPHOS", "BILITOT", "PROT", "ALBUMIN" in the last 168 hours. No results for input(s): "LIPASE", "AMYLASE" in the last 168 hours. No results for input(s): "AMMONIA" in the last 168 hours. Coagulation Profile: No results for input(s): "INR", "PROTIME" in the last 168 hours. Cardiac Enzymes: No results for input(s): "CKTOTAL", "CKMB", "CKMBINDEX", "TROPONINI" in the last 168 hours. BNP (last 3 results) No results for input(s): "PROBNP" in the last 8760 hours. HbA1C: No results for input(s): "HGBA1C" in the last 72 hours. CBG: Recent Labs  Lab 10/26/22 0608 10/26/22 1047 10/26/22 1600 10/26/22 2107 10/27/22 0611  GLUCAP 145* 121* 101* 194* 101*   Lipid Profile: Recent Labs    10/27/22 0550  CHOL 104  HDL 49  LDLCALC 38  TRIG 84  CHOLHDL 2.1   Thyroid Function Tests: No results for input(s): "TSH", "T4TOTAL", "FREET4", "T3FREE", "THYROIDAB" in the last 72 hours. Anemia Panel: No results for input(s): "VITAMINB12", "FOLATE", "FERRITIN", "TIBC", "IRON", "RETICCTPCT" in the last 72 hours. Sepsis Labs: No results for input(s): "PROCALCITON", "LATICACIDVEN" in the last 168 hours.  Recent Results (from the past 240 hour(s))  MRSA Next Gen by PCR, Nasal     Status: None   Collection Time: 10/21/22 12:32 PM   Specimen: Nasal Mucosa; Nasal Swab  Result Value Ref Range Status   MRSA by PCR Next Gen NOT DETECTED NOT DETECTED Final    Comment: (NOTE) The GeneXpert MRSA Assay (FDA  approved for NASAL specimens only), is one component of a comprehensive MRSA colonization surveillance program. It is not intended to diagnose MRSA infection nor to guide or monitor treatment for MRSA infections. Test performance is not FDA approved in patients less than 63 years old. Performed at [facility], [address], [place], [place] [address]          Radiology Studies: MR ANGIO HEAD WO CONTRAST  Result Date: 10/26/2022 CLINICAL DATA:  Stroke, follow-up.  Traumatic injury. EXAM: MRA HEAD WITHOUT CONTRAST TECHNIQUE: Angiographic images of the Circle of Willis were acquired using MRA technique without intravenous contrast. COMPARISON:  MRI of the head without contrast 10/25/2022 FINDINGS: Anterior circulation: The internal carotid arteries are within normal limits from the high cervical segments through the ICA termini. The A1 and M1 segments are normal. The anterior communicating artery is patent. The ACA and MCA branch vessels are normal bilaterally. Posterior circulation: The right vertebral artery is dominant. The vertebrobasilar junction basilar artery normal. Both posterior cerebral arteries originate from basilar tip. Posterior communicating arteries are patent bilaterally. The PCA branch vessels are within normal limits. Anatomic variants: None IMPRESSION: Normal MRA circle-of-Willis without evidence for significant proximal stenosis, aneurysm, or branch vessel occlusion. Electronically Signed   By: Marin Roberts M.D.   On: 10/26/2022 19:04   CT PELVIS WO CONTRAST  Result Date: 10/26/2022 CLINICAL DATA:  Pelvic pain.  Concern for left pubic bone fracture. EXAM: CT PELVIS WITHOUT CONTRAST TECHNIQUE: Multidetector CT imaging of the pelvis was performed following the standard protocol without intravenous contrast. RADIATION DOSE REDUCTION: This exam was performed according to the departmental dose-optimization program which includes automated exposure control, adjustment  of the mA and/or kV according to patient size and/or use of iterative reconstruction technique. COMPARISON:  CT abdomen pelvis dated 03/22/2022 and left hip radiograph dated 10/25/2022. FINDINGS: Evaluation of this exam is limited in the absence of intravenous contrast. Urinary Tract:  The urinary bladder is grossly unremarkable. Bowel: Moderate stool in the visualized colon. No bowel dilatation in the pelvis. Vascular/Lymphatic: Advanced aortoiliac atherosclerotic disease. No pelvic adenopathy. Reproductive:  Hysterectomy.  No adnexal masses. Other: Diffuse subcutaneous edema and anasarca. No fluid collection. Musculoskeletal: Minimally displaced fracture of the left inferior pubic ramus. Focal area of slight cortical angulation of the left superior pubic ramus adjacent to the symphysis pubis may represent a nondisplaced fracture. No other acute fracture. The bones are osteopenic. Moderate bilateral hip arthritic changes. There is degenerative changes of the visualized lower lumbar spine. IMPRESSION: 1. Minimally displaced fracture of the left inferior pubic ramus and probable nondisplaced fracture of the left superior pubic ramus. 2.  Aortic Atherosclerosis (ICD10-I70.0). Electronically Signed   By: Elgie Collard M.D.   On: 10/26/2022 16:34   MR BRAIN WO CONTRAST  Result Date: 10/26/2022 CLINICAL DATA:  Initial  evaluation for neuro deficit, stroke suspected. EXAM: MRI HEAD WITHOUT CONTRAST TECHNIQUE: Multiplanar, multiecho pulse sequences of the brain and surrounding structures were obtained without intravenous contrast. COMPARISON:  Prior CT from 10/24/2022. FINDINGS: Brain: Examination degraded by motion artifact. Generalized age-related cerebral atrophy. Patchy and confluent T2/FLAIR hyperintensity involving the periventricular white matter, consistent with chronic small vessel ischemic disease, mild for age. Few small remote bilateral cerebellar infarcts noted. 7 mm focus of mild diffusion signal  abnormality involving the subcortical posterior right frontal region (series 2, image 30), suspicious for a small acute to a early subacute ischemic infarct possible additional punctate acute to early subacute ischemic infarct noted involving the cortical anterior right frontal convexity (a series 2, image 32). No associated hemorrhage or mass effect. No other evidence for acute or recent infarction. Contrecoup hemorrhagic contusion involving the anterior right frontal convexity is relatively stable measuring 2.6 x 2.0 x 0.9 cm. Surrounding vasogenic edema without significant regional mass effect. No other acute intracranial hemorrhage. Few scattered chronic micro hemorrhages noted involving the right cerebellum and bilateral cerebral hemispheres, likely small vessel/hypertensive in nature. No mass lesion. Ventricles normal size without hydrocephalus. Pituitary gland suprasellar region within normal limits. Small bilateral subdural collections again seen measuring up to 4 mm bilaterally, relatively similar to previous. Collections are mildly complex with some internal susceptibility artifact, suggesting blood products, more evident on the right. No significant mass effect or midline shift. Basilar cisterns remain patent. Vascular: Loss of normal flow void within the left V4 segment, which could reflect slow flow and/or occlusion (series 5, image 3). Right vertebral artery dominant. Major intracranial vascular flow voids otherwise maintained. Skull and upper cervical spine: Craniocervical junction within normal limits. Bone marrow signal intensity normal. Left occipital scalp contusion noted. No calvarial fracture better seen on prior CT. Sinuses/Orbits: Prior bilateral ocular lens replacement. Globes and orbital soft tissues demonstrate no other acute finding. Paranasal sinuses are largely clear. No significant mastoid effusion. Other: None. IMPRESSION: 1. Stable size and morphology of contrecoup hemorrhagic  contusion involving the anterior right frontal convexity. Similar localized edema without significant regional mass effect. 2. 7 mm focus of mild diffusion signal abnormality involving the subcortical posterior right frontal region, suspicious for a small acute to early subacute ischemic nonhemorrhagic infarct. Possible additional punctate infarct involving the anterior right frontal lobe as above. 3. Small bilateral subdural collections measuring up to 4 mm bilaterally without significant mass effect or midline shift, similar to previous. 4. Loss of normal flow void within the left V4 segment, which could reflect slow flow and/or occlusion. 5. Underlying age-related cerebral atrophy with mild chronic small vessel ischemic disease, with a few small remote bilateral cerebellar infarcts. 6. Evolving left posterior scalp contusion. Known underlying calvarial fracture better seen on prior CT. Electronically Signed   By: Rise Mu M.D.   On: 10/26/2022 04:48   ECHOCARDIOGRAM COMPLETE  Result Date: 10/25/2022    ECHOCARDIOGRAM REPORT   Patient Name:   Kim Burns Date of Exam: 10/25/2022 Medical Rec #:  [id number]        Height:       63.0 in Accession #:    [id number]       Weight:       123.0 lb Date of Birth:  10/04/1935        BSA:          1.573 m Patient Age:    87 years         BP:  148/81 mmHg Patient Gender: F                HR:           115 bpm. Exam Location:  Inpatient Procedure: 2D Echo, Color Doppler and Cardiac Doppler Indications:    Syncope  History:        Patient has prior history of Echocardiogram examinations, most                 recent 09/15/2022. CKD/ESRD and Stroke, Arrythmias:Atrial                 Fibrillation; Risk Factors:Hypertension and Diabetes.  Sonographer:    Milbert Coulter Referring Phys: [id number] Zunaira Lamy A Amneet Cendejas IMPRESSIONS  1. Left ventricular ejection fraction, by estimation, is 45 to 50%. Left ventricular ejection fraction by PLAX is 48 %. The left ventricle has  mildly decreased function. The left ventricle demonstrates global hypokinesis. There is mild left ventricular hypertrophy. Left ventricular diastolic function could not be evaluated.  2. Right ventricular systolic function is mildly reduced. The right ventricular size is normal.  3. Left atrial size was severely dilated.  4. Right atrial size was mildly dilated.  5. The mitral valve is abnormal. Mild to moderate mitral valve regurgitation.  6. The aortic valve is tricuspid. Aortic valve regurgitation is not visualized. Aortic valve sclerosis is present, with no evidence of aortic valve stenosis.  7. The inferior vena cava is normal in size with greater than 50% respiratory variability, suggesting right atrial pressure of 3 mmHg.  8. Rhythm strip during this exam demonstrates atrial fibrillation and with rapid ventricular response. Comparison(s): Changes from prior study are noted. 09/15/2022: LVEF 55-60%. FINDINGS  Left Ventricle: Left ventricular ejection fraction, by estimation, is 45 to 50%. Left ventricular ejection fraction by PLAX is 48 %. The left ventricle has mildly decreased function. The left ventricle demonstrates global hypokinesis. The left ventricular internal cavity size was normal in size. There is mild left ventricular hypertrophy. Left ventricular diastolic function could not be evaluated due to atrial fibrillation. Left ventricular diastolic function could not be evaluated. Right Ventricle: The right ventricular size is normal. No increase in right ventricular wall thickness. Right ventricular systolic function is mildly reduced. Left Atrium: Left atrial size was severely dilated. Right Atrium: Right atrial size was mildly dilated. Pericardium: There is no evidence of pericardial effusion. Mitral Valve: The mitral valve is abnormal. There is mild calcification of the posterior and anterior mitral valve leaflet(s). Mild to moderate mitral annular calcification. Mild to moderate mitral valve  regurgitation. Tricuspid Valve: The tricuspid valve is normal in structure. Tricuspid valve regurgitation is not demonstrated. Aortic Valve: The aortic valve is tricuspid. Aortic valve regurgitation is not visualized. Aortic valve sclerosis is present, with no evidence of aortic valve stenosis. Aortic valve mean gradient measures 3.0 mmHg. Aortic valve peak gradient measures 5.3  mmHg. Aortic valve area, by VTI measures 2.26 cm. Pulmonic Valve: The pulmonic valve was normal in structure. Pulmonic valve regurgitation is not visualized. Aorta: The aortic root and ascending aorta are structurally normal, with no evidence of dilitation. Venous: The inferior vena cava is normal in size with greater than 50% respiratory variability, suggesting right atrial pressure of 3 mmHg. IAS/Shunts: No atrial level shunt detected by color flow Doppler. EKG: Rhythm strip during this exam demonstrates atrial fibrillation and with rapid ventricular response.  LEFT VENTRICLE PLAX 2D LV EF:         Left  Diastology                ventricular     LV e' medial:    12.00 cm/s                ejection        LV E/e' medial:  10.4                fraction by     LV e' lateral:   18.50 cm/s                PLAX is 48      LV E/e' lateral: 6.8                %. LVIDd:         3.80 cm LVIDs:         2.90 cm LV PW:         1.10 cm LV IVS:        1.20 cm LVOT diam:     1.90 cm LV SV:         38 LV SV Index:   24 LVOT Area:     2.84 cm  RIGHT VENTRICLE RV Basal diam:  3.50 cm RV Mid diam:    2.70 cm RV S prime:     12.20 cm/s TAPSE (M-mode): 1.0 cm LEFT ATRIUM             Index        RIGHT ATRIUM           Index LA diam:        4.30 cm 2.73 cm/m   RA Area:     16.50 cm LA Vol (A2C):   64.3 ml 40.88 ml/m  RA Volume:   40.70 ml  25.88 ml/m LA Vol (A4C):   87.9 ml 55.89 ml/m LA Biplane Vol: 75.6 ml 48.07 ml/m  AORTIC VALVE AV Area (Vmax):    2.18 cm AV Area (Vmean):   1.93 cm AV Area (VTI):     2.26 cm AV Vmax:           115.00 cm/s  AV Vmean:          85.000 cm/s AV VTI:            0.168 m AV Peak Grad:      5.3 mmHg AV Mean Grad:      3.0 mmHg LVOT Vmax:         88.40 cm/s LVOT Vmean:        58.000 cm/s LVOT VTI:          0.134 m LVOT/AV VTI ratio: 0.80  AORTA Ao Root diam: 2.60 cm MITRAL VALVE MV Area (PHT): 4.29 cm     SHUNTS MV Decel Time: 177 msec     Systemic VTI:  0.13 m MV E velocity: 125.00 cm/s  Systemic Diam: 1.90 cm MV A velocity: 42.30 cm/s MV E/A ratio:  2.96 Zoila Shutter MD Electronically signed by Zoila Shutter MD Signature Date/Time: 10/25/2022/5:32:24 PM    Final    DG HIP UNILAT WITH PELVIS 2-3 VIEWS LEFT  Result Date: 10/25/2022 CLINICAL DATA:  Pain. EXAM: DG HIP (WITH OR WITHOUT PELVIS) 2-3V LEFT COMPARISON:  Lumbar spine CT 10/21/2022 FINDINGS: The bones are subjectively under mineralized. Irregularity of the left inferior pubic ramus may represent a nondisplaced fracture in the appropriate clinical setting. Known sacral fracture is not demonstrated by radiograph. Slight hip joint space narrowing  and spurring. No erosive change or evidence of avascular necrosis. Pubic symphysis and sacroiliac joints are congruent. There are multiple pelvic phleboliths. Lower extremity arterial vascular calcifications. IMPRESSION: 1. Irregularity of the left inferior pubic ramus may represent a nondisplaced fracture in the appropriate clinical setting. 2. Mild left hip osteoarthritis. Electronically Signed   By: Narda Rutherford M.D.   On: 10/25/2022 10:00        Scheduled Meds:   stroke: early stages of recovery book   Does not apply Once   acetaminophen  500 mg Oral TID   amiodarone  200 mg Oral BID   Chlorhexidine Gluconate Cloth  6 each Topical Daily   Chlorhexidine Gluconate Cloth  6 each Topical Q0600   insulin aspart  0-6 Units Subcutaneous TID WC   lipase/protease/amylase  36,000 Units Oral BID WC   metoprolol tartrate  25 mg Oral Q12H   prednisoLONE acetate  1 drop Both Eyes BID   rOPINIRole  0.5 mg Oral QHS    sevelamer carbonate  2,400 mg Oral TID WC   valACYclovir  500 mg Oral QHS   Continuous Infusions:     LOS: 6 days    Time spent: 35 minutes    Rivkah Wolz A Martasia Talamante, MD [facility]   If 7PM-7AM, please contact night-coverage [email or web address]  10/27/2022, 7:23 AM

## 2022-10-27 NOTE — Progress Notes (Signed)
  [facility] Progress Note   Subjective:   Patient seen in room. No /co's today. In good spirits.   Objective Vitals:   10/27/22 0614 10/27/22 0644 10/27/22 0823 10/27/22 1150  BP:  (!) 162/82 (!) 172/88 (!) 169/69  Pulse:  62 60 63  Resp: 17 20 19 20   Temp:  97.6 F (36.4 C) (!) 97.4 F (36.3 C) 98.4 F (36.9 C)  TempSrc:  Oral Oral Oral  SpO2:  97%  98%  Weight: 52.6 kg     Height:       Physical Exam General: Frail-appearing elderly female lying supine on hospital bed.  Heart:RRR, no M/R/G Lungs: clear bilat  Abdomen: soft, non-tender, non-distended.  Extremities: Trace pretib edema  Dialysis Access: LUA AVF +thrill   OP HD:  [facility] MWF   3.5h   55kg   350/1.5   2/2 bath  L AVF  Heparin none - last dialyzed on 8/30, post wt 54.5kg.   Assessment/Plan:  # Frontal Contusion: Managed by neurosurgery. F/u CT was normal. She seems to be improved.   # ESRD - MWF HD. Cont MWF here. HD Monday .   # Hypertension: bp's a bit high. On po metoprolol here.   # Volume - a bit under dry wt, euvolemic on exam. UF 1-2 L w/ HD tomorrow.    # Anemia of CKD: Trend CBCs. Hb 10-13.  Doing well at present.   #  Hyperglycemia: Managed by CC team. On sliding scale.    Vinson Moselle  MD  [facility] 10/27/2022, 3:20 PM  Recent Labs  Lab 10/23/22 0603 10/23/22 0845 10/24/22 1536 10/26/22 0314 10/27/22 0550  HGB  --  10.6* 13.0  --   --   CALCIUM 8.1* 7.6* 8.2* 7.8* 7.8*  PHOS 8.6* 8.2*  --   --   --   CREATININE 4.34* 4.32* 3.59* 2.72* 3.59*  K 5.1 3.8 3.8 3.4* 3.4*    Inpatient medications:  acetaminophen  500 mg Oral TID   amiodarone  200 mg Oral BID   Followed by   Melene Muller ON 11/04/2022] amiodarone  200 mg Oral Daily   aspirin  81 mg Oral Daily   Chlorhexidine Gluconate Cloth  6 each Topical Daily   Chlorhexidine Gluconate Cloth  6 each Topical Q0600   insulin aspart  0-6 Units Subcutaneous TID WC   lipase/protease/amylase  36,000 Units Oral BID WC   [START ON  10/28/2022] metoprolol succinate  25 mg Oral Daily   prednisoLONE acetate  1 drop Both Eyes BID   rOPINIRole  0.5 mg Oral QHS   sevelamer carbonate  2,400 mg Oral TID WC   valACYclovir  500 mg Oral QHS     acetaminophen, cycloSPORINE, docusate sodium, hydrALAZINE, meclizine, olopatadine, ondansetron (ZOFRAN) IV, mouth rinse, polyethylene glycol, rOPINIRole

## 2022-10-27 NOTE — Procedures (Signed)
Patient Name: TEMPRENCE BODY  MRN: [id number]  Epilepsy Attending: Charlsie Quest  Referring Physician/Provider: Tomie China, MD  Date: 10/27/2022 Duration: 23.23 mins  Patient history: 87yo F with ams getting eeg to evaluate for seizure  Level of alertness: Awake, asleep  AEDs during EEG study: None  Technical aspects: This EEG study was done with scalp electrodes positioned according to the 10-20 International system of electrode placement. Electrical activity was reviewed with band pass filter of 1-70Hz , sensitivity of 7 uV/mm, display speed of 56mm/sec with a 60Hz  notched filter applied as appropriate. EEG data were recorded continuously and digitally stored.  Video monitoring was available and reviewed as appropriate.  Description: The posterior dominant rhythm consists of 8-9 Hz activity of moderate voltage (25-35 uV) seen predominantly in posterior head regions, symmetric and reactive to eye opening and eye closing.Sleep was characterized by vertex waves, sleep spindles (12 to 14 Hz), maximal frontocentral region. EEG showed intermittent generalized 3 to 6 Hz theta-delta slowing. Physiologic photic driving was not seen during photic stimulation.  Hyperventilation was not performed.     ABNORMALITY - Intermittent slow, generalized  IMPRESSION: This study is suggestive of mild diffuse encephalopathy, nonspecific etiology.No seizures or epileptiform discharges were seen throughout the recording.  Jveon Pound Annabelle Harman

## 2022-10-27 NOTE — PMR Pre-admission (Shared)
PMR Admission Coordinator Pre-Admission Assessment  Patient: Kim Burns is an 87 y.o., female MRN: [id number] DOB: 28-Dec-1935 Height: 5\' 3"  (160 cm) Weight: 52.6 kg              Insurance Information HMO: ***    PPO: ***     PCP:      IPA:      80/20:      OTHER:  PRIMARY: [employer]      Policy#: [id number]      Subscriber: patient CM Name: ***      Phone#: ***     Fax#: *** Pre-Cert#: ***      Employer: *** Benefits:  Phone #: ***     Name: *** Dolores Hoose. Date: ***     Deduct: ***      Out of Pocket Max: ***      Life Max: ***  CIR: ***      SNF: *** Outpatient: ***     Co-Pay: *** Home Health: ***      Co-Pay: *** DME: ***     Co-Pay: *** Providers: in-network SECONDARY:       Policy#:       Phone#:   Financial Counselor:       Phone#:   The Data processing manager" for patients in Inpatient Rehabilitation Facilities with attached "Privacy Act Statement-Health Care Records" was provided and verbally reviewed with: {CHL IP Patient Family YQ:[id number]}  Emergency Contact Information Contact Information     Name Relation Home Work Mobile   [place] Son [phone]  [phone]   [facility] Daughter [phone]  [phone]   Colley,Danielle Granddaughter [phone]  [phone]   Elsi, Budd [phone]  [phone]   Amalia Greenhouse   [phone]      Other Contacts   None on File    Current Medical History  Patient Admitting Diagnosis: TBI, CVA History of Present Illness: Pt is an 87 year old female with medical hx significant for: Whipple procedure, 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  Pt [facility] to [facility] on 10/[facility]22 d/t being found down by family at home after apparent LOC. Imaging showed right frontal hemorrhagic contusion, a nondisplaced left parietal and occipital fracture, left posterior  scalp hematoma, small nondisplaced S4 fracture. Neurosurgery consulted. Repeat CT showed stable right frontal contusion/hematoma. Cardiology consulted on 9/4 d/t A-fib with RVR. Started pt on amiodarone drip. Pt converted to sinus rhythm on 9/5, back in A-fib on 9/6, converted to sinus rhythm on 9/7. Due increased confusion on 9/4,  a repeat CT on 9/5 showed stable hemorrhagic contusion and two small hygromas. Neurosurgery consulted and did not recommend surgical intervention. MRI on 9/6 was suspicious for small acute to early subacute ischemic nonhemorrhagic infarct. Possible additional punctate infarct involving anterior right frontal lobe. Neurology consulted and recommended full stroke workup. MRA was normal. 2D Echo found EF 45-50%Orthopedics consulted on 9/7 d/t pt c/o left hip pain. Imaging showed possible left inferior pubic rami fracture as well as small distal sacral fracture. No surgical intervention warranted and pt allowed to weightbear as tolerated. *** Complete NIHSS TOTAL: 1 Glasgow Coma Scale Score: 15  Patient's medical record from [facility] has been reviewed by the rehabilitation admission coordinator and physician.  Past Medical History  Past Medical History:  Diagnosis Date   Anemia of chronic disease    takes iron   Anxiety  Blood transfusion without reported diagnosis    Bradycardia    Diabetes mellitus without complication (HCC)    became diabetic after Whipple procedure   Diarrhea    Dysrhythmia 04/16/2016   bradycardia due to medication    ESRD on hemodialysis [facility])    M-W-F   GERD (gastroesophageal reflux disease)    Gout    Headache    History of kidney stones 06/2013   Hyperparathyroidism (HCC)    Hypertension    PONV (postoperative nausea and vomiting)    Sleep apnea    no cpap machine. could not tolerate   Stroke (HCC) 04/16/2016   TIA 1995   Vitamin D deficiency     Has the patient had major surgery during 100 days prior to admission?  No  Family History  family history includes Alzheimer's disease in her sister and sister; Cancer in her brother and father; Heart disease in her mother; Stroke in her mother.   Current Medications   Current Facility-Administered Medications:    acetaminophen (TYLENOL) tablet 500 mg, 500 mg, Oral, TID, Regalado, Belkys A, MD, 500 mg at 10/27/22 1527   acetaminophen (TYLENOL) tablet 650 mg, 650 mg, Oral, Q6H PRN, Bowser, Kaylyn Layer, NP, 650 mg at 10/26/22 0242   amiodarone (PACERONE) tablet 200 mg, 200 mg, Oral, BID **FOLLOWED BY** [START ON 11/04/2022] amiodarone (PACERONE) tablet 200 mg, 200 mg, Oral, Daily, Little Ishikawa, MD   aspirin chewable tablet 81 mg, 81 mg, Oral, Daily, Gevena Mart A, NP, 81 mg at 10/27/22 1527   Chlorhexidine Gluconate Cloth 2 % PADS 6 each, 6 each, Topical, Daily, Leslye Peer, MD, 6 each at 10/27/22 1610   Chlorhexidine Gluconate Cloth 2 % PADS 6 each, 6 each, Topical, Q0600, Delano Metz, MD, 6 each at 10/27/22 0628   [START ON 10/28/2022] Chlorhexidine Gluconate Cloth 2 % PADS 6 each, 6 each, Topical, Q0600, Delano Metz, MD   cycloSPORINE (RESTASIS) 0.05 % ophthalmic emulsion 1 drop, 1 drop, Both Eyes, Daily PRN, Bowser, Kaylyn Layer, NP   docusate sodium (COLACE) capsule 100 mg, 100 mg, Oral, BID PRN, Bowser, Kaylyn Layer, NP   hydrALAZINE (APRESOLINE) injection 10 mg, 10 mg, Intravenous, Q4H PRN, Bowser, Grace E, NP, 10 mg at 10/27/22 1222   insulin aspart (novoLOG) injection 0-6 Units, 0-6 Units, Subcutaneous, TID WC, Bowser, Grace E, NP   lipase/protease/amylase (CREON) capsule 36,000 Units, 36,000 Units, Oral, BID WC, Bowser, Kaylyn Layer, NP, 36,000 Units at 10/27/22 9604   meclizine (ANTIVERT) tablet 12.5 mg, 12.5 mg, Oral, TID PRN, Regalado, Belkys A, MD, 12.5 mg at 10/27/22 1214   [START ON 10/28/2022] metoprolol succinate (TOPROL-XL) 24 hr tablet 25 mg, 25 mg, Oral, Daily, Little Ishikawa, MD   olopatadine (PATANOL) 0.1 % ophthalmic solution 1  drop, 1 drop, Both Eyes, Daily PRN, Bowser, Grace E, NP   ondansetron (ZOFRAN) injection 4 mg, 4 mg, Intravenous, Q6H PRN, Bowser, Kaylyn Layer, NP   Oral care mouth rinse, 15 mL, Mouth Rinse, PRN, Byrum, Les Pou, MD   polyethylene glycol (MIRALAX / GLYCOLAX) packet 17 g, 17 g, Oral, Daily PRN, Bowser, Kaylyn Layer, NP   prednisoLONE acetate (PRED FORTE) 1 % ophthalmic suspension 1 drop, 1 drop, Both Eyes, BID, Bowser, Grace E, NP, 1 drop at 10/24/22 2122   rOPINIRole (REQUIP) tablet 0.25 mg, 0.25 mg, Oral, Daily PRN, Regalado, Belkys A, MD, 0.25 mg at 10/27/22 1527   rOPINIRole (REQUIP) tablet 0.5 mg, 0.5 mg, Oral, QHS, Regalado, Belkys A, MD, 0.5  mg at 10/26/22 2124   sevelamer carbonate (RENVELA) tablet 2,400 mg, 2,400 mg, Oral, TID WC, Delano Metz, MD, 2,400 mg at 10/27/22 1214   valACYclovir (VALTREX) tablet 500 mg, 500 mg, Oral, QHS, Bowser, Grace E, NP, 500 mg at 10/24/22 2121  Patients Current Diet:  Diet Order             Diet regular Room service appropriate? Yes with Assist; Fluid consistency: Thin; Fluid restriction: 1500 mL Fluid  Diet effective now                   Precautions / Restrictions Precautions Precautions: Fall Restrictions Weight Bearing Restrictions: Yes LLE Weight Bearing: Weight bearing as tolerated Other Position/Activity Restrictions: use RW per ortho MD Reynolds   Has the patient had 2 or more falls or a fall with injury in the past year?Yes  Prior Activity Level Community (5-7x/wk): leaves house ~3-4 days/week  Prior Functional Level Prior Function Prior Level of Function : Independent/Modified Independent Mobility Comments: Per chart review, pt furniture walks in her home and uses a RW in the community. Per pt report this day, she uses a walking stick most of the time in and out of the home and a Rollator sometimes. ADLs Comments: Pt reports Supervision for ADLs and IADLs from son and daughter.  Self Care: Did the patient need help bathing,  dressing, using the toilet or eating?  Independent  Indoor Mobility: Did the patient need assistance with walking from room to room (with or without device)? Independent  Stairs: Did the patient need assistance with internal or external stairs (with or without device)? Independent  Functional Cognition: Did the patient need help planning regular tasks such as shopping or remembering to take medications? Needed some help  Patient Information    Patient's Response To:     Home Assistive Devices / Equipment Home Equipment: Agricultural consultant (2 wheels), Shower seat, Rollator (4 wheels), Wheelchair - manual, [place] - single point  Prior Device Use: Indicate devices/aids used by the patient prior to current illness, exacerbation or injury? Walker and cane  Current Functional Level Cognition  Arousal/Alertness: Lethargic Overall Cognitive Status: Impaired/Different from baseline Orientation Level: Oriented X4 General Comments: Uncertain of pt's baseline. Pt presents AAOx4 but with confusion regarding why she is seeing multiple care providers in hospital. Pt with impaired safety awareness and limited insight into deficits. Pt able to follow novel 1 step commands with increased time and verbal and tactile cues. Pt sequencing familiar tasks (ex. brushing teeth) appropriately without cues. Pt with Emergent awareness and SUstained attention. Attention: Sustained Sustained Attention: Appears intact Memory: Impaired Memory Impairment: Decreased recall of new information Awareness: Appears intact Safety/Judgment: Appears intact    Extremity Assessment (includes Sensation/Coordination)  Upper Extremity Assessment: Defer to OT evaluation (general bil weakness, but functional use for transitions/transfers and use of the RW) RUE Deficits / Details: gerneralized weakness; pt reposts hx of numbness in thumb and first finger (worse on Right); decreased fine motor coordinaiton RUE Sensation: history of  peripheral neuropathy RUE Coordination: decreased fine motor LUE Deficits / Details: gerneralized weakness; pt reposts hx of numbness in thumb and first finger (worse on Right); decreased fine motor coordinaiton LUE Sensation: history of peripheral neuropathy LUE Coordination: decreased fine motor  Lower Extremity Assessment: Generalized weakness, LLE deficits/detail LLE Deficits / Details: painful quads.  Slow to move due to stiffness LLE Coordination: decreased fine motor    ADLs  Overall ADL's : Needs assistance/impaired Eating/Feeding: Set up, Bed level (  with HOB elevated) Grooming: Set up, Bed level (with HOB elevated) Upper Body Bathing: Minimal assistance, Bed level (with HOB elevated) Lower Body Bathing: Moderate assistance, Bed level (with HOB elevated) Upper Body Dressing : Contact guard assist, Bed level (with HOB elevated) Lower Body Dressing: Maximal assistance, Bed level (with HOB elevated) General ADL Comments: ADLs assessed from bed level secondary to pt declining sitting EOB or in recliner secondary to pt reporting concern over A Fib and pain in Left thigh. OT educated pt in importance of OOB activity and in RN giving pt the OK to participate in OT session with pt continuing to decline.    Mobility  Overal bed mobility: Needs Assistance Bed Mobility: Supine to Sit, Sit to Supine Rolling: Min assist, Used rails Supine to sit: Min assist Sit to supine: Min assist General bed mobility comments: assisted over to R UE.  Up with R elbow to sitting EOB.  Returned with LE assist    Transfers  Overall transfer level: Needs assistance Transfers: Sit to/from Stand Sit to Stand: Min assist General transfer comment: Pt declined sititng EOB and OOB activity this session.    Ambulation / Gait / Stairs / Wheelchair Mobility  Ambulation/Gait Ambulation/Gait assistance: Editor, commissioning (Feet): 6 Feet (Forward and back) Assistive device: Rolling walker (2 wheels) Gait  Pattern/deviations: Step-to pattern General Gait Details: step to pattern with antalgic gait on the L.  generally tentative and painful. Gait velocity interpretation: <1.31 ft/sec, indicative of household ambulator    Posture / Balance Dynamic Sitting Balance Sitting balance - Comments: Pt declined sititng EOB Balance Overall balance assessment: Needs assistance Sitting-balance support: No upper extremity supported, Feet supported Sitting balance-Leahy Scale: Fair Sitting balance - Comments: Pt declined sititng EOB Standing balance-Leahy Scale: Fair Standing balance comment: Due to L LE pain, pt relied on UE assist of the Rollator.but could let go of either UE and maintain balance.    Special needs/care consideration Dialysis: Hemodialysis Monday, Wednesday, and Friday, Skin Pressure Injury: arm/lower, posterior, right; Skin Tear: arm/left, lower, posterior, proximal; Stage 2 Pressure Injury: buttocks/right; Stage 1 Pressure Injury: buttocks/left; Erythema/Redness: arm/bilateral; Ecchymosis: arm, head, hip/bialteral; Abrasion: arm/bilateral , Bowel and Bladder incontinence, Nephrostomy and Diabetic management Novolog 0-6 units 3x daily with meals     Previous Home Environment (from acute therapy documentation) Living Arrangements: Alone, Children (son stays with her and sometimes her daugther as well)  Lives With: Son (daughter stays as well when she's in town) Available Help at Discharge: Family, Available 24 hours/day Type of Home: House Home Layout: Able to live on main level with bedroom/bathroom, Laundry or work area in basement Alternate Teacher, music of Steps: ~13 Home Access: Stairs to enter Entrance Stairs-Rails: Left, Right (One set of steps has rail on left. The other set of steps has rails on both sides which cannot be reached at the same time.) Entrance Stairs-Number of Steps: 13, 4 Bathroom Shower/Tub: Engineer, manufacturing systems: Handicapped height Bathroom  Accessibility: Yes How Accessible: Accessible via walker Home Care Services: No Additional Comments: Pt appears to be an unreliable reporter. During this session, pt reports that son and daughter stay with her 24/7, that son and daughter are taking turns staying with pt, that daughter is now teaching in [place], that no one was home when she fell, that her daughter was downstairs and she was upstairs when she fell, and that she does not go upstairs. Per chart, family found pt asleep on the floor when son-in-law arrived to take pt  to HD.  Discharge Living Setting Plans for Discharge Living Setting: Patient's home Type of Home at Discharge: House Discharge Home Layout: Able to live on main level with bedroom/bathroom, Laundry or work area in basement Discharge Home Access: Stairs to enter Entrance Stairs-Rails: Right, Left (One set of steps has rail on left. The other set of steps has rail on both sides which cannot be reached at the same time.) Entrance Stairs-Number of Steps: 13, 4 Discharge Bathroom Shower/Tub: Tub/shower unit Discharge Bathroom Toilet: Handicapped height Discharge Bathroom Accessibility: Yes How Accessible: Accessible via walker Does the patient have any problems obtaining your medications?: No  Social/Family/Support Systems Anticipated Caregiver: Aariel Frederking, son and other family Anticipated Caregiver's Contact Information: [phone] Caregiver Availability: 24/7 Discharge Plan Discussed with Primary Caregiver: Yes Is Caregiver In Agreement with Plan?: Yes Does Caregiver/Family have Issues with Lodging/Transportation while Pt is in Rehab?: No   Goals Patient/Family Goal for Rehab: *** Expected length of stay: *** Pt/Family Agrees to Admission and willing to participate: Yes Program Orientation Provided & Reviewed with Pt/Caregiver Including Roles  & Responsibilities: Yes   Decrease burden of Care through IP rehab admission: NA   Possible need for SNF  placement upon discharge:Not anticipated   Patient Condition: {PATIENT'S CONDITION:22832}  Preadmission Screen Completed By:  Domingo Pulse, CCC-SLP, 10/27/2022 3:45 PM ______________________________________________________________________   Discussed status with Dr. Marland Kitchenon***at *** and received approval for admission today.  Admission Coordinator:  Domingo Pulse, time***/Date***

## 2022-10-27 NOTE — Progress Notes (Signed)
Progress Note  Patient Name: Kim Burns Date of Encounter: 10/27/2022  Primary Cardiologist: Peter Swazil[place], MD   Subjective   BP 172/88.  Denies any chest pain or dyspnea  Inpatient Medications    Scheduled Meds:  acetaminophen  500 mg Oral TID   amiodarone  200 mg Oral BID   Chlorhexidine Gluconate Cloth  6 each Topical Daily   Chlorhexidine Gluconate Cloth  6 each Topical Q0600   insulin aspart  0-6 Units Subcutaneous TID WC   lipase/protease/amylase  36,000 Units Oral BID WC   metoprolol tartrate  25 mg Oral Q12H   prednisoLONE acetate  1 drop Both Eyes BID   rOPINIRole  0.5 mg Oral QHS   sevelamer carbonate  2,400 mg Oral TID WC   valACYclovir  500 mg Oral QHS   Continuous Infusions:   PRN Meds: acetaminophen, cycloSPORINE, docusate sodium, hydrALAZINE, meclizine, olopatadine, ondansetron (ZOFRAN) IV, mouth rinse, polyethylene glycol, rOPINIRole   Vital Signs    Vitals:   10/26/22 2149 10/27/22 0614 10/27/22 0644 10/27/22 0823  BP: (!) 158/74  (!) 162/82 (!) 172/88  Pulse: 60  62 60  Resp: 17 17 20 19   Temp: 98.1 F (36.7 C)  97.6 F (36.4 C) (!) 97.4 F (36.3 C)  TempSrc: Oral  Oral Oral  SpO2: 100%  97%   Weight:  52.6 kg    Height:        Intake/Output Summary (Last 24 hours) at 10/27/2022 0936 Last data filed at 10/27/2022 0600 Gross per 24 hour  Intake 120 ml  Output 50 ml  Net 70 ml   Filed Weights   10/25/22 1506 10/26/22 0611 10/27/22 0614  Weight: 55.8 kg 52.7 kg 52.6 kg    Telemetry    NSR-  Personally Reviewed  ECG    None today  - Personally Reviewed  Physical Exam    GEN: in no acute distress HEENT: normal Neck: + JVD Cardiac: RRR; no murmurs, rubs, or gallops,no edema  Respiratory:  clear to auscultation bilaterally, GI: soft, nontender MS: no deformity or atrophy Skin: warm and dry, no rash Neuro:  Alert and Oriented x 3 Psych: normal affect   Labs    Chemistry Recent Labs  Lab 10/24/22 1536 10/26/22 0314  10/27/22 0550  NA 130* 131* 132*  K 3.8 3.4* 3.4*  CL 90* 91* 91*  CO2 23 24 25   GLUCOSE 145* 136* 123*  BUN 32* 19 30*  CREATININE 3.59* 2.72* 3.59*  CALCIUM 8.2* 7.8* 7.8*  GFRNONAA 12* 17* 12*  ANIONGAP 17* 16* 16*     Hematology Recent Labs  Lab 10/21/22 0756 10/23/22 0845 10/24/22 1536  WBC 11.3* 8.7 7.7  RBC 3.89 3.46* 4.19  HGB 12.1 10.6* 13.0  HCT 38.6 32.8* 40.4  MCV 99.2 94.8 96.4  MCH 31.1 30.6 31.0  MCHC 31.3 32.3 32.2  RDW 18.0* 18.1* 17.9*  PLT 175 179 195    Cardiac EnzymesNo results for input(s): "TROPONINI" in the last 168 hours. No results for input(s): "TROPIPOC" in the last 168 hours.   BNPNo results for input(s): "BNP", "PROBNP" in the last 168 hours.   DDimer No results for input(s): "DDIMER" in the last 168 hours.   Radiology    MR ANGIO HEAD WO CONTRAST  Result Date: 10/26/2022 CLINICAL DATA:  Stroke, follow-up.  Traumatic injury. EXAM: MRA HEAD WITHOUT CONTRAST TECHNIQUE: Angiographic images of the Circle of Willis were acquired using MRA technique without intravenous contrast. COMPARISON:  MRI of the  head without contrast 10/25/2022 FINDINGS: Anterior circulation: The internal carotid arteries are within normal limits from the high cervical segments through the ICA termini. The A1 and M1 segments are normal. The anterior communicating artery is patent. The ACA and MCA branch vessels are normal bilaterally. Posterior circulation: The right vertebral artery is dominant. The vertebrobasilar junction basilar artery normal. Both posterior cerebral arteries originate from basilar tip. Posterior communicating arteries are patent bilaterally. The PCA branch vessels are within normal limits. Anatomic variants: None IMPRESSION: Normal MRA circle-of-Willis without evidence for significant proximal stenosis, aneurysm, or branch vessel occlusion. Electronically Signed   By: Marin Roberts M.D.   On: 10/26/2022 19:04   CT PELVIS WO CONTRAST  Result Date:  10/26/2022 CLINICAL DATA:  Pelvic pain.  Concern for left pubic bone fracture. EXAM: CT PELVIS WITHOUT CONTRAST TECHNIQUE: Multidetector CT imaging of the pelvis was performed following the standard protocol without intravenous contrast. RADIATION DOSE REDUCTION: This exam was performed according to the departmental dose-optimization program which includes automated exposure control, adjustment of the mA and/or kV according to patient size and/or use of iterative reconstruction technique. COMPARISON:  CT abdomen pelvis dated 03/22/2022 and left hip radiograph dated 10/25/2022. FINDINGS: Evaluation of this exam is limited in the absence of intravenous contrast. Urinary Tract:  The urinary bladder is grossly unremarkable. Bowel: Moderate stool in the visualized colon. No bowel dilatation in the pelvis. Vascular/Lymphatic: Advanced aortoiliac atherosclerotic disease. No pelvic adenopathy. Reproductive:  Hysterectomy.  No adnexal masses. Other: Diffuse subcutaneous edema and anasarca. No fluid collection. Musculoskeletal: Minimally displaced fracture of the left inferior pubic ramus. Focal area of slight cortical angulation of the left superior pubic ramus adjacent to the symphysis pubis may represent a nondisplaced fracture. No other acute fracture. The bones are osteopenic. Moderate bilateral hip arthritic changes. There is degenerative changes of the visualized lower lumbar spine. IMPRESSION: 1. Minimally displaced fracture of the left inferior pubic ramus and probable nondisplaced fracture of the left superior pubic ramus. 2.  Aortic Atherosclerosis (ICD10-I70.0). Electronically Signed   By: Elgie Collard M.D.   On: 10/26/2022 16:34   MR BRAIN WO CONTRAST  Result Date: 10/26/2022 CLINICAL DATA:  Initial evaluation for neuro deficit, stroke suspected. EXAM: MRI HEAD WITHOUT CONTRAST TECHNIQUE: Multiplanar, multiecho pulse sequences of the brain and surrounding structures were obtained without intravenous  contrast. COMPARISON:  Prior CT from 10/24/2022. FINDINGS: Brain: Examination degraded by motion artifact. Generalized age-related cerebral atrophy. Patchy and confluent T2/FLAIR hyperintensity involving the periventricular white matter, consistent with chronic small vessel ischemic disease, mild for age. Few small remote bilateral cerebellar infarcts noted. 7 mm focus of mild diffusion signal abnormality involving the subcortical posterior right frontal region (series 2, image 30), suspicious for a small acute to a early subacute ischemic infarct possible additional punctate acute to early subacute ischemic infarct noted involving the cortical anterior right frontal convexity (a series 2, image 32). No associated hemorrhage or mass effect. No other evidence for acute or recent infarction. Contrecoup hemorrhagic contusion involving the anterior right frontal convexity is relatively stable measuring 2.6 x 2.0 x 0.9 cm. Surrounding vasogenic edema without significant regional mass effect. No other acute intracranial hemorrhage. Few scattered chronic micro hemorrhages noted involving the right cerebellum and bilateral cerebral hemispheres, likely small vessel/hypertensive in nature. No mass lesion. Ventricles normal size without hydrocephalus. Pituitary gland suprasellar region within normal limits. Small bilateral subdural collections again seen measuring up to 4 mm bilaterally, relatively similar to previous. Collections are mildly complex with some internal  susceptibility artifact, suggesting blood products, more evident on the right. No significant mass effect or midline shift. Basilar cisterns remain patent. Vascular: Loss of normal flow void within the left V4 segment, which could reflect slow flow and/or occlusion (series 5, image 3). Right vertebral artery dominant. Major intracranial vascular flow voids otherwise maintained. Skull and upper cervical spine: Craniocervical junction within normal limits. Bone  marrow signal intensity normal. Left occipital scalp contusion noted. No calvarial fracture better seen on prior CT. Sinuses/Orbits: Prior bilateral ocular lens replacement. Globes and orbital soft tissues demonstrate no other acute finding. Paranasal sinuses are largely clear. No significant mastoid effusion. Other: None. IMPRESSION: 1. Stable size and morphology of contrecoup hemorrhagic contusion involving the anterior right frontal convexity. Similar localized edema without significant regional mass effect. 2. 7 mm focus of mild diffusion signal abnormality involving the subcortical posterior right frontal region, suspicious for a small acute to early subacute ischemic nonhemorrhagic infarct. Possible additional punctate infarct involving the anterior right frontal lobe as above. 3. Small bilateral subdural collections measuring up to 4 mm bilaterally without significant mass effect or midline shift, similar to previous. 4. Loss of normal flow void within the left V4 segment, which could reflect slow flow and/or occlusion. 5. Underlying age-related cerebral atrophy with mild chronic small vessel ischemic disease, with a few small remote bilateral cerebellar infarcts. 6. Evolving left posterior scalp contusion. Known underlying calvarial fracture better seen on prior CT. Electronically Signed   By: Rise Mu M.D.   On: 10/26/2022 04:48   ECHOCARDIOGRAM COMPLETE  Result Date: 10/25/2022    ECHOCARDIOGRAM REPORT   Patient Name:   Sherlie P Buckels Date of Exam: 10/25/2022 Medical Rec #:  [id number]        Height:       63.0 in Accession #:    [id number]       Weight:       123.0 lb Date of Birth:  1935/04/14        BSA:          1.573 m Patient Age:    87 years         BP:           148/81 mmHg Patient Gender: F                HR:           115 bpm. Exam Location:  Inpatient Procedure: 2D Echo, Color Doppler and Cardiac Doppler Indications:    Syncope  History:        Patient has prior history of  Echocardiogram examinations, most                 recent 09/15/2022. CKD/ESRD and Stroke, Arrythmias:Atrial                 Fibrillation; Risk Factors:Hypertension and Diabetes.  Sonographer:    Milbert Coulter Referring Phys: [id number] BELKYS A REGALADO IMPRESSIONS  1. Left ventricular ejection fraction, by estimation, is 45 to 50%. Left ventricular ejection fraction by PLAX is 48 %. The left ventricle has mildly decreased function. The left ventricle demonstrates global hypokinesis. There is mild left ventricular hypertrophy. Left ventricular diastolic function could not be evaluated.  2. Right ventricular systolic function is mildly reduced. The right ventricular size is normal.  3. Left atrial size was severely dilated.  4. Right atrial size was mildly dilated.  5. The mitral valve is abnormal. Mild to moderate mitral valve regurgitation.  6. The aortic  valve is tricuspid. Aortic valve regurgitation is not visualized. Aortic valve sclerosis is present, with no evidence of aortic valve stenosis.  7. The inferior vena cava is normal in size with greater than 50% respiratory variability, suggesting right atrial pressure of 3 mmHg.  8. Rhythm strip during this exam demonstrates atrial fibrillation and with rapid ventricular response. Comparison(s): Changes from prior study are noted. 09/15/2022: LVEF 55-60%. FINDINGS  Left Ventricle: Left ventricular ejection fraction, by estimation, is 45 to 50%. Left ventricular ejection fraction by PLAX is 48 %. The left ventricle has mildly decreased function. The left ventricle demonstrates global hypokinesis. The left ventricular internal cavity size was normal in size. There is mild left ventricular hypertrophy. Left ventricular diastolic function could not be evaluated due to atrial fibrillation. Left ventricular diastolic function could not be evaluated. Right Ventricle: The right ventricular size is normal. No increase in right ventricular wall thickness. Right ventricular systolic  function is mildly reduced. Left Atrium: Left atrial size was severely dilated. Right Atrium: Right atrial size was mildly dilated. Pericardium: There is no evidence of pericardial effusion. Mitral Valve: The mitral valve is abnormal. There is mild calcification of the posterior and anterior mitral valve leaflet(s). Mild to moderate mitral annular calcification. Mild to moderate mitral valve regurgitation. Tricuspid Valve: The tricuspid valve is normal in structure. Tricuspid valve regurgitation is not demonstrated. Aortic Valve: The aortic valve is tricuspid. Aortic valve regurgitation is not visualized. Aortic valve sclerosis is present, with no evidence of aortic valve stenosis. Aortic valve mean gradient measures 3.0 mmHg. Aortic valve peak gradient measures 5.3  mmHg. Aortic valve area, by VTI measures 2.26 cm. Pulmonic Valve: The pulmonic valve was normal in structure. Pulmonic valve regurgitation is not visualized. Aorta: The aortic root and ascending aorta are structurally normal, with no evidence of dilitation. Venous: The inferior vena cava is normal in size with greater than 50% respiratory variability, suggesting right atrial pressure of 3 mmHg. IAS/Shunts: No atrial level shunt detected by color flow Doppler. EKG: Rhythm strip during this exam demonstrates atrial fibrillation and with rapid ventricular response.  LEFT VENTRICLE PLAX 2D LV EF:         Left            Diastology                ventricular     LV e' medial:    12.00 cm/s                ejection        LV E/e' medial:  10.4                fraction by     LV e' lateral:   18.50 cm/s                PLAX is 48      LV E/e' lateral: 6.8                %. LVIDd:         3.80 cm LVIDs:         2.90 cm LV PW:         1.10 cm LV IVS:        1.20 cm LVOT diam:     1.90 cm LV SV:         38 LV SV Index:   24 LVOT Area:     2.84 cm  RIGHT VENTRICLE RV Basal diam:  3.50 cm  RV Mid diam:    2.70 cm RV S prime:     12.20 cm/s TAPSE (M-mode): 1.0 cm LEFT  ATRIUM             Index        RIGHT ATRIUM           Index LA diam:        4.30 cm 2.73 cm/m   RA Area:     16.50 cm LA Vol (A2C):   64.3 ml 40.88 ml/m  RA Volume:   40.70 ml  25.88 ml/m LA Vol (A4C):   87.9 ml 55.89 ml/m LA Biplane Vol: 75.6 ml 48.07 ml/m  AORTIC VALVE AV Area (Vmax):    2.18 cm AV Area (Vmean):   1.93 cm AV Area (VTI):     2.26 cm AV Vmax:           115.00 cm/s AV Vmean:          85.000 cm/s AV VTI:            0.168 m AV Peak Grad:      5.3 mmHg AV Mean Grad:      3.0 mmHg LVOT Vmax:         88.40 cm/s LVOT Vmean:        58.000 cm/s LVOT VTI:          0.134 m LVOT/AV VTI ratio: 0.80  AORTA Ao Root diam: 2.60 cm MITRAL VALVE MV Area (PHT): 4.29 cm     SHUNTS MV Decel Time: 177 msec     Systemic VTI:  0.13 m MV E velocity: 125.00 cm/s  Systemic Diam: 1.90 cm MV A velocity: 42.30 cm/s MV E/A ratio:  2.96 Zoila Shutter MD Electronically signed by Zoila Shutter MD Signature Date/Time: 10/25/2022/5:32:24 PM    Final     Cardiac Studies   TTE 09/15/2022 IMPRESSIONS     1. Left ventricular ejection fraction, by estimation, is 55 to 60%. The  left ventricle has normal function. The left ventricle has no regional  wall motion abnormalities. Left ventricular diastolic parameters are  indeterminate.   2. Right ventricular systolic function is mildly reduced. The right  ventricular size is normal. There is severely elevated pulmonary artery  systolic pressure.   3. Left atrial size was severely dilated.   4. Right atrial size was mild to moderately dilated.   5. The mitral valve is degenerative. Mild mitral valve regurgitation.  Moderate mitral annular calcification.   6. Tricuspid valve regurgitation is moderate.   7. The aortic valve is tricuspid. Aortic valve regurgitation is not  visualized.   8. The inferior vena cava is normal in size with greater than 50%  respiratory variability, suggesting right atrial pressure of 3 mmHg.   FINDINGS   Left Ventricle: Left ventricular  ejection fraction, by estimation, is 55  to 60%. The left ventricle has normal function. The left ventricle has no  regional wall motion abnormalities. The left ventricular internal cavity  size was normal in size. There is   no left ventricular hypertrophy. Left ventricular diastolic parameters  are indeterminate.   Right Ventricle: The right ventricular size is normal. Right ventricular  systolic function is mildly reduced. There is severely elevated pulmonary  artery systolic pressure. The tricuspid regurgitant velocity is 3.90 m/s,  and with an assumed right atrial  pressure of 3 mmHg, the estimated right ventricular systolic pressure is  63.8 mmHg.   Left Atrium: Left atrial size was  severely dilated.   Right Atrium: Right atrial size was mild to moderately dilated.   Pericardium: There is no evidence of pericardial effusion.   Mitral Valve: The mitral valve is degenerative in appearance. Moderate  mitral annular calcification. Mild mitral valve regurgitation.   Tricuspid Valve: The tricuspid valve is normal in structure. Tricuspid  valve regurgitation is moderate.   Aortic Valve: The aortic valve is tricuspid. Aortic valve regurgitation is  not visualized.   Pulmonic Valve: The pulmonic valve was normal in structure. Pulmonic valve  regurgitation is not visualized.   Aorta: The aortic root and ascending aorta are structurally normal, with  no evidence of dilitation.   Venous: The inferior vena cava is normal in size with greater than 50%  respiratory variability, suggesting right atrial pressure of 3 mmHg.   IAS/Shunts: No atrial level shunt detected by color flow Doppler.    Patient Profile     87 y.o. female  with a hx of Hypertension, PAF  formerly on AC (dc after spontaneous renal hematoma), HTN, s/p whipple + (pancreatic mass, no malignancy, high grady dysplasia), RCC s/p embo/ ablation in 2023 and ESRD on hemodialysis, CAD mild to moderate tricuspid admitted for  R frontal hemorrhagic contusion, non-displaced L parietal and occipital fx, L posterior scalp hematoma and now in atrial fibrillation with rvr.   Assessment & Plan    Atrial fibrillation with RVR: Developed A-fib with RVR, started on IV amiodarone 9/4; she is on amiodarone as outpatient to maintain sinus rhythm.  Not on anticoagulation in setting of hemorrhagic cerebral contusion as well as history of spontaneous renal hematoma.  Echocardiogram shows EF 45 to 50%, mild RV dysfunction, severe left atrial enlargement, mild right atrial enlargement, mild to moderate MR.   -Continue metoprolol 25 mg twice daily, will consolidate to toprol XL -Converted to sinus rhythm, switched to PO amio 9/7.  Would plan amiodarone 200 mg twice daily x 1 week then decrease to 200 mg daily  Acute systolic heart failure: EF 45 to 50% on echo as above.  Likely due to A-fib with RVR.  Will plan to reevaluate systolic function as outpatient once maintaining sinus rhythm.  Continue metoprolol  TBI: Presented with skull fracture, scalp hematoma, hemorrhagic cerebral contusion.  Per neurosurgery hold aspirin x 1 week.  Holding anticoagulation for A-fib as above head CT has been stable  ESRD: On HD  [facility] will sign off.   Medication Recommendations: Amiodarone 200 mg twice daily x 1 week then decrease to 200 mg daily.  Metoprolol XL 25 mg daily Other recommendations (labs, testing, etc): None Follow up as an outpatient: Appointment scheduled 9/24    For questions or updates, please contact [facility] Please consult [email or web address] for contact info under Cardiology/STEMI.      Signed, Little Ishikawa, MD  10/27/2022, 9:36 AM

## 2022-10-27 NOTE — Plan of Care (Signed)
  Problem: Education: Goal: Ability to describe self-care measures that may prevent or decrease complications (Diabetes Survival Skills Education) will improve Outcome: Progressing Goal: Individualized Educational Video(s) Outcome: Progressing   Problem: Coping: Goal: Ability to adjust to condition or change in health will improve Outcome: Progressing   Problem: Fluid Volume: Goal: Ability to maintain a balanced intake and output will improve Outcome: Progressing   Problem: Health Behavior/Discharge Planning: Goal: Ability to identify and utilize available resources and services will improve Outcome: Progressing Goal: Ability to manage health-related needs will improve Outcome: Progressing   Problem: Metabolic: Goal: Ability to maintain appropriate glucose levels will improve Outcome: Progressing   Problem: Nutritional: Goal: Maintenance of adequate nutrition will improve Outcome: Progressing Goal: Progress toward achieving an optimal weight will improve Outcome: Progressing   Problem: Skin Integrity: Goal: Risk for impaired skin integrity will decrease Outcome: Progressing   Problem: Tissue Perfusion: Goal: Adequacy of tissue perfusion will improve Outcome: Progressing   Problem: Education: Goal: Knowledge of General Education information will improve Description: Including pain rating scale, medication(s)/side effects and non-pharmacologic comfort measures Outcome: Progressing   Problem: Health Behavior/Discharge Planning: Goal: Ability to manage health-related needs will improve Outcome: Progressing   Problem: Clinical Measurements: Goal: Ability to maintain clinical measurements within normal limits will improve Outcome: Progressing Goal: Will remain free from infection Outcome: Progressing Goal: Diagnostic test results will improve Outcome: Progressing Goal: Respiratory complications will improve Outcome: Progressing Goal: Cardiovascular complication will  be avoided Outcome: Progressing   Problem: Activity: Goal: Risk for activity intolerance will decrease Outcome: Progressing   Problem: Nutrition: Goal: Adequate nutrition will be maintained Outcome: Progressing   Problem: Coping: Goal: Level of anxiety will decrease Outcome: Progressing   Problem: Elimination: Goal: Will not experience complications related to bowel motility Outcome: Progressing Goal: Will not experience complications related to urinary retention Outcome: Progressing   Problem: Pain Managment: Goal: General experience of comfort will improve Outcome: Progressing   Problem: Safety: Goal: Ability to remain free from injury will improve Outcome: Progressing   Problem: Skin Integrity: Goal: Risk for impaired skin integrity will decrease Outcome: Progressing   Problem: Education: Goal: Knowledge of disease or condition will improve Outcome: Progressing Goal: Understanding of medication regimen will improve Outcome: Progressing   Problem: Cardiac: Goal: Ability to achieve and maintain adequate cardiopulmonary perfusion will improve Outcome: Progressing   Problem: Health Behavior/Discharge Planning: Goal: Ability to safely manage health-related needs after discharge will improve Outcome: Progressing   Problem: Education: Goal: Knowledge of disease or condition will improve Outcome: Progressing Goal: Knowledge of secondary prevention will improve (MUST DOCUMENT ALL) Outcome: Progressing Goal: Knowledge of patient specific risk factors will improve Kim Burns N/A or DELETE if not current risk factor) Outcome: Progressing   Problem: Ischemic Stroke/TIA Tissue Perfusion: Goal: Complications of ischemic stroke/TIA will be minimized Outcome: Progressing   Problem: Coping: Goal: Will verbalize positive feelings about self Outcome: Progressing Goal: Will identify appropriate support needs Outcome: Progressing   Problem: Health Behavior/Discharge  Planning: Goal: Ability to manage health-related needs will improve Outcome: Progressing Goal: Goals will be collaboratively established with patient/family Outcome: Progressing   Problem: Self-Care: Goal: Ability to participate in self-care as condition permits will improve Outcome: Progressing Goal: Verbalization of feelings and concerns over difficulty with self-care will improve Outcome: Progressing Goal: Ability to communicate needs accurately will improve Outcome: Progressing   Problem: Nutrition: Goal: Risk of aspiration will decrease Outcome: Progressing Goal: Dietary intake will improve Outcome: Progressing

## 2022-10-27 NOTE — Progress Notes (Signed)
OT Cancellation Note  Patient Details Name: Kim Burns MRN: [id number] DOB: [birth year]   Cancelled Treatment:    Reason Eval/Treat Not Completed: Patient declined, no reason specified (declines OOB mobility today, will follow up next date as schedule permits)  Kim Fila, OTD, OTR/L [facility] Preferred Acute Rehab [phone]   Kim Burns 10/27/2022, 9:09 AM

## 2022-10-27 NOTE — Plan of Care (Signed)
Problem: Education: Goal: Ability to describe self-care measures that may prevent or decrease complications (Diabetes Survival Skills Education) will improve 10/27/2022 0552 by Orson Ape, RN Outcome: Progressing 10/27/2022 0014 by Orson Ape, RN Outcome: Progressing Goal: Individualized Educational Video(s) 10/27/2022 0552 by Orson Ape, RN Outcome: Progressing 10/27/2022 0014 by Orson Ape, RN Outcome: Progressing   Problem: Coping: Goal: Ability to adjust to condition or change in health will improve 10/27/2022 0552 by Orson Ape, RN Outcome: Progressing 10/27/2022 0014 by Orson Ape, RN Outcome: Progressing   Problem: Fluid Volume: Goal: Ability to maintain a balanced intake and output will improve 10/27/2022 0552 by Orson Ape, RN Outcome: Progressing 10/27/2022 0014 by Orson Ape, RN Outcome: Progressing   Problem: Health Behavior/Discharge Planning: Goal: Ability to identify and utilize available resources and services will improve 10/27/2022 0552 by Orson Ape, RN Outcome: Progressing 10/27/2022 0014 by Orson Ape, RN Outcome: Progressing Goal: Ability to manage health-related needs will improve 10/27/2022 0552 by Orson Ape, RN Outcome: Progressing 10/27/2022 0014 by Orson Ape, RN Outcome: Progressing   Problem: Metabolic: Goal: Ability to maintain appropriate glucose levels will improve 10/27/2022 0552 by Orson Ape, RN Outcome: Progressing 10/27/2022 0014 by Orson Ape, RN Outcome: Progressing   Problem: Nutritional: Goal: Maintenance of adequate nutrition will improve 10/27/2022 0552 by Orson Ape, RN Outcome: Progressing 10/27/2022 0014 by Orson Ape, RN Outcome: Progressing Goal: Progress toward achieving an optimal weight will improve 10/27/2022 0552 by Orson Ape, RN Outcome: Progressing 10/27/2022 0014 by Orson Ape, RN Outcome: Progressing   Problem:  Skin Integrity: Goal: Risk for impaired skin integrity will decrease 10/27/2022 0552 by Orson Ape, RN Outcome: Progressing 10/27/2022 0014 by Orson Ape, RN Outcome: Progressing   Problem: Tissue Perfusion: Goal: Adequacy of tissue perfusion will improve 10/27/2022 0552 by Orson Ape, RN Outcome: Progressing 10/27/2022 0014 by Orson Ape, RN Outcome: Progressing   Problem: Education: Goal: Knowledge of General Education information will improve Description: Including pain rating scale, medication(s)/side effects and non-pharmacologic comfort measures 10/27/2022 0552 by Orson Ape, RN Outcome: Progressing 10/27/2022 0014 by Orson Ape, RN Outcome: Progressing   Problem: Health Behavior/Discharge Planning: Goal: Ability to manage health-related needs will improve 10/27/2022 0552 by Orson Ape, RN Outcome: Progressing 10/27/2022 0014 by Orson Ape, RN Outcome: Progressing   Problem: Clinical Measurements: Goal: Ability to maintain clinical measurements within normal limits will improve 10/27/2022 0552 by Orson Ape, RN Outcome: Progressing 10/27/2022 0014 by Orson Ape, RN Outcome: Progressing Goal: Will remain free from infection 10/27/2022 0552 by Orson Ape, RN Outcome: Progressing 10/27/2022 0014 by Orson Ape, RN Outcome: Progressing Goal: Diagnostic test results will improve 10/27/2022 0552 by Orson Ape, RN Outcome: Progressing 10/27/2022 0014 by Orson Ape, RN Outcome: Progressing Goal: Respiratory complications will improve 10/27/2022 0552 by Orson Ape, RN Outcome: Progressing 10/27/2022 0014 by Orson Ape, RN Outcome: Progressing Goal: Cardiovascular complication will be avoided 10/27/2022 0552 by Orson Ape, RN Outcome: Progressing 10/27/2022 0014 by Orson Ape, RN Outcome: Progressing   Problem: Activity: Goal: Risk for activity intolerance will  decrease 10/27/2022 0552 by Orson Ape, RN Outcome: Progressing 10/27/2022 0014 by Orson Ape, RN Outcome: Progressing   Problem: Nutrition: Goal: Adequate nutrition will be maintained 10/27/2022 0552 by Orson Ape, RN Outcome: Progressing 10/27/2022 0014 by Orson Ape, RN Outcome: Progressing   Problem: Coping: Goal: Level of anxiety will decrease 10/27/2022 0552 by Orson Ape, RN Outcome: Progressing 10/27/2022 0014 by Orson Ape, RN Outcome: Progressing   Problem: Elimination: Goal: Will not experience complications related to bowel motility 10/27/2022 0552  by Orson Ape, RN Outcome: Progressing 10/27/2022 0014 by Orson Ape, RN Outcome: Progressing Goal: Will not experience complications related to urinary retention 10/27/2022 0552 by Orson Ape, RN Outcome: Progressing 10/27/2022 0014 by Orson Ape, RN Outcome: Progressing   Problem: Pain Managment: Goal: General experience of comfort will improve 10/27/2022 0552 by Orson Ape, RN Outcome: Progressing 10/27/2022 0014 by Orson Ape, RN Outcome: Progressing   Problem: Safety: Goal: Ability to remain free from injury will improve 10/27/2022 0552 by Orson Ape, RN Outcome: Progressing 10/27/2022 0014 by Orson Ape, RN Outcome: Progressing   Problem: Skin Integrity: Goal: Risk for impaired skin integrity will decrease 10/27/2022 0552 by Orson Ape, RN Outcome: Progressing 10/27/2022 0014 by Orson Ape, RN Outcome: Progressing   Problem: Education: Goal: Knowledge of disease or condition will improve 10/27/2022 0552 by Orson Ape, RN Outcome: Progressing 10/27/2022 0014 by Orson Ape, RN Outcome: Progressing Goal: Understanding of medication regimen will improve 10/27/2022 0552 by Orson Ape, RN Outcome: Progressing 10/27/2022 0014 by Orson Ape, RN Outcome: Progressing   Problem: Cardiac: Goal: Ability  to achieve and maintain adequate cardiopulmonary perfusion will improve 10/27/2022 0552 by Orson Ape, RN Outcome: Progressing 10/27/2022 0014 by Orson Ape, RN Outcome: Progressing   Problem: Health Behavior/Discharge Planning: Goal: Ability to safely manage health-related needs after discharge will improve 10/27/2022 0552 by Orson Ape, RN Outcome: Progressing 10/27/2022 0014 by Orson Ape, RN Outcome: Progressing   Problem: Education: Goal: Knowledge of disease or condition will improve 10/27/2022 0552 by Orson Ape, RN Outcome: Progressing 10/27/2022 0014 by Orson Ape, RN Outcome: Progressing Goal: Knowledge of secondary prevention will improve (MUST DOCUMENT ALL) 10/27/2022 0552 by Orson Ape, RN Outcome: Progressing 10/27/2022 0014 by Orson Ape, RN Outcome: Progressing Goal: Knowledge of patient specific risk factors will improve [name] N/A or DELETE if not current risk factor) 10/27/2022 0552 by Orson Ape, RN Outcome: Progressing 10/27/2022 0014 by Orson Ape, RN Outcome: Progressing   Problem: Ischemic Stroke/TIA Tissue Perfusion: Goal: Complications of ischemic stroke/TIA will be minimized 10/27/2022 0552 by Orson Ape, RN Outcome: Progressing 10/27/2022 0014 by Orson Ape, RN Outcome: Progressing   Problem: Coping: Goal: Will verbalize positive feelings about self 10/27/2022 0552 by Orson Ape, RN Outcome: Progressing 10/27/2022 0014 by Orson Ape, RN Outcome: Progressing Goal: Will identify appropriate support needs 10/27/2022 0552 by Orson Ape, RN Outcome: Progressing 10/27/2022 0014 by Orson Ape, RN Outcome: Progressing   Problem: Health Behavior/Discharge Planning: Goal: Ability to manage health-related needs will improve 10/27/2022 0552 by Orson Ape, RN Outcome: Progressing 10/27/2022 0014 by Orson Ape, RN Outcome: Progressing Goal: Goals will be  collaboratively established with patient/family 10/27/2022 0552 by Orson Ape, RN Outcome: Progressing 10/27/2022 0014 by Orson Ape, RN Outcome: Progressing   Problem: Self-Care: Goal: Ability to participate in self-care as condition permits will improve 10/27/2022 0552 by Orson Ape, RN Outcome: Progressing 10/27/2022 0014 by Orson Ape, RN Outcome: Progressing Goal: Verbalization of feelings and concerns over difficulty with self-care will improve 10/27/2022 0552 by Orson Ape, RN Outcome: Progressing 10/27/2022 0014 by Orson Ape, RN Outcome: Progressing Goal: Ability to communicate needs accurately will improve 10/27/2022 0552 by Orson Ape, RN Outcome: Progressing 10/27/2022 0014 by Orson Ape, RN Outcome: Progressing

## 2022-10-28 DIAGNOSIS — I4891 Unspecified atrial fibrillation: Secondary | ICD-10-CM | POA: Diagnosis not present

## 2022-10-28 DIAGNOSIS — I6349 Cerebral infarction due to embolism of other cerebral artery: Secondary | ICD-10-CM

## 2022-10-28 DIAGNOSIS — S069X1D Unspecified intracranial injury with loss of consciousness of 30 minutes or less, subsequent encounter: Secondary | ICD-10-CM

## 2022-10-28 DIAGNOSIS — I5021 Acute systolic (congestive) heart failure: Secondary | ICD-10-CM | POA: Diagnosis not present

## 2022-10-28 LAB — CBC
HCT: 33.4 % — ABNORMAL LOW (ref 36.0–46.0)
Hemoglobin: 10.9 g/dL — ABNORMAL LOW (ref 12.0–15.0)
MCH: 30.7 pg (ref 26.0–34.0)
MCHC: 32.6 g/dL (ref 30.0–36.0)
MCV: 94.1 fL (ref 80.0–100.0)
Platelets: 214 10*3/uL (ref 150–400)
RBC: 3.55 MIL/uL — ABNORMAL LOW (ref 3.87–5.11)
RDW: 17.6 % — ABNORMAL HIGH (ref 11.5–15.5)
WBC: 8.8 10*3/uL (ref 4.0–10.5)
nRBC: 0 % (ref 0.0–0.2)

## 2022-10-28 LAB — RENAL FUNCTION PANEL
Albumin: 2 g/dL — ABNORMAL LOW (ref 3.5–5.0)
Anion gap: 14 (ref 5–15)
BUN: 38 mg/dL — ABNORMAL HIGH (ref 8–23)
CO2: 23 mmol/L (ref 22–32)
Calcium: 7.6 mg/dL — ABNORMAL LOW (ref 8.9–10.3)
Chloride: 93 mmol/L — ABNORMAL LOW (ref 98–111)
Creatinine, Ser: 4.23 mg/dL — ABNORMAL HIGH (ref 0.44–1.00)
GFR, Estimated: 10 mL/min — ABNORMAL LOW (ref >60)
Glucose, Bld: 149 mg/dL — ABNORMAL HIGH (ref 70–99)
Phosphorus: 6.5 mg/dL — ABNORMAL HIGH (ref 2.5–4.6)
Potassium: 3.5 mmol/L (ref 3.5–5.1)
Sodium: 130 mmol/L — ABNORMAL LOW (ref 135–145)

## 2022-10-28 LAB — GLUCOSE, CAPILLARY
Glucose-Capillary: 99 mg/dL (ref 70–99)
Glucose-Capillary: 106 mg/dL — ABNORMAL HIGH (ref 70–99)
Glucose-Capillary: 116 mg/dL — ABNORMAL HIGH (ref 70–99)
Glucose-Capillary: 111 mg/dL — ABNORMAL HIGH (ref 70–99)
Glucose-Capillary: 89 mg/dL (ref 70–99)
Glucose-Capillary: 126 mg/dL — ABNORMAL HIGH (ref 70–99)

## 2022-10-28 MED ORDER — PENTAFLUOROPROP-TETRAFLUOROETH EX AERO: 1.0000 | INHALATION_SPRAY | CUTANEOUS | Status: DC | PRN

## 2022-10-28 MED ORDER — NEPRO/CARBSTEADY PO LIQD: 237.0000 mL | ORAL | Status: DC | PRN

## 2022-10-28 MED ORDER — LIDOCAINE-PRILOCAINE 2.5-2.5 % EX CREA: 1.0000 | TOPICAL_CREAM | CUTANEOUS | Status: DC | PRN

## 2022-10-28 MED ORDER — ANTICOAGULANT SODIUM CITRATE 4% (200MG/5ML) IV SOLN
5.0000 mL | Status: DC | PRN
  Filled 2022-10-28: qty 5

## 2022-10-28 MED ORDER — LIDOCAINE HCL (PF) 1 % IJ SOLN: 5.0000 mL | INTRAMUSCULAR | Status: DC | PRN

## 2022-10-28 MED ORDER — ALTEPLASE 2 MG IJ SOLR: 2.0000 mg | Freq: Once | INTRAMUSCULAR | Status: DC | PRN

## 2022-10-28 MED ORDER — ALBUMIN HUMAN 25 % IV SOLN: 25.0000 g | Freq: Once | INTRAVENOUS | Status: DC

## 2022-10-28 MED ORDER — HEPARIN SODIUM (PORCINE) 1000 UNIT/ML DIALYSIS: 1000.0000 [IU] | INTRAMUSCULAR | Status: DC | PRN

## 2022-10-28 NOTE — Progress Notes (Addendum)
Physical Therapy Treatment Patient Details Name: Kim Burns MRN: [id number] DOB: 08/04/1935 Today's Date: 10/28/2022   History of Present Illness Pt is an 87 y.o. female who presented to [facility] ED on 10/21/22 after being found asleep on the floor by family with pt not remembering falling. Pt found to have right frontal hemorrhagic contusion, a nondisplaced left parietal and occipital fracture, left posterior scalp hematoma, and small nondisplaced S4 fracture. CT scan on 10/24/22 revealed Small new Left > right Subdural Hematomas have developed since 10/21/22. Recent admission in July 2024 for afib with RVR. Found to have left inferior pubic ramus fx felt to be minimally displaced and no interventions needed WBAT per ortho 10/26/22.  Also found to have a small acute ischemic stroke in posterior right frontal region with additional possible punctate infarct involving right anterior frontal lobe on MRI 9/7 felt cardioembolic in setting of a-fib not on anticoagulation. PMH - afib, ESRD on HD, chf, DM, HTN, [place].    PT Comments  Patient progressing slowly due to pain, dizziness and fatigue.  Had just finished with OT session, though willing to work to allow session prior to dialysis today.  She needed max A to stand due to low seat, painful rib and hip on the L and dizziness.  Cues provided for using vision to aide in reducing dizziness symptoms.  She was able to take steps in the room but limited by pain.  RN reports family has requested only Tylenol for pain due to difficulty tolerating heavier meds.  Pt cooperative and progressing slowly.  Feel she is an excellent candidate for intensive inpatient rehab prior to home where family can provide needed support.    If plan is discharge home, recommend the following: A lot of help with walking and/or transfers;A lot of help with bathing/dressing/bathroom;Assistance with cooking/housework;Direct supervision/assist for medications management;Assist for  transportation;Help with stairs or ramp for entrance   Can travel by private vehicle        Equipment Recommendations  None recommended by PT    Recommendations for Other Services Rehab consult     Precautions / Restrictions Precautions Precautions: Fall Restrictions Weight Bearing Restrictions: Yes LLE Weight Bearing: Weight bearing as tolerated     Mobility  Bed Mobility               General bed mobility comments: up in chair following OT    Transfers Overall transfer level: Needs assistance Equipment used: Rolling walker (2 wheels) Transfers: Sit to/from Stand Sit to Stand: Max assist           General transfer comment: lifting help to stand with PT in front and pt pulling up on PT's arms, assist at hips for full upright posture prior to transitioning hands to RW    Ambulation/Gait Ambulation/Gait assistance: Min assist Gait Distance (Feet): 5 Feet (fwd then back) Assistive device: Rolling walker (2 wheels) Gait Pattern/deviations: Step-to pattern, Shuffle, Trunk flexed, Antalgic, Decreased stance time - left       General Gait Details: cues for using UE's on walker to unweight painful L LE when in stance. cues for forward gaze on stationary target due to dizziness symptoms   Stairs             Wheelchair Mobility     Tilt Bed    Modified Rankin (Stroke Patients Only) Modified Rankin (Stroke Patients Only) Pre-Morbid Rankin Score: Moderately severe disability Modified Rankin: Moderately severe disability     Balance Overall balance assessment: Needs assistance  Sitting balance-Leahy Scale: Fair     Standing balance support: Bilateral upper extremity supported Standing balance-Leahy Scale: Poor Standing balance comment: heavily reliant on UE support, posterior bias initially needing mod A for bringing hips under trunk                            Cognition Arousal: Alert Behavior During Therapy: WFL for tasks  assessed/performed Overall Cognitive Status: Impaired/Different from baseline Area of Impairment: Problem solving, Attention                   Current Attention Level: Selective         Problem Solving: Slow processing General Comments: possibly not far from baseline, though baseline unknown        Exercises General Exercises - Lower Extremity Ankle Circles/Pumps: AROM, 10 reps, Both, Seated Long Arc Quad: AROM, 5 reps, Both, Seated Hip ABduction/ADduction: Strengthening, Both, 5 reps, Seated (hip adductor squeezes w/ 5 sec hold)    General Comments General comments (skin integrity, edema, etc.): SBP in 130's in standing      Pertinent Vitals/Pain Pain Assessment Pain Score: 9  Pain Location: L hip and rib Pain Descriptors / Indicators: Sore Pain Intervention(s): Monitored during session, Repositioned, Limited activity within patient's tolerance, Patient requesting pain meds-RN notified    Home Living Family/patient expects to be discharged to:: Private residence Living Arrangements: Children (daughter assists in "summertime" son assists when daughter is not present,son works from home) Available Help at Discharge: Family;Available 24 hours/day Type of Home: House Home Access: Stairs to enter Entrance Stairs-Rails: Lawyer of Steps: 4 to get in the house Alternate Level Stairs-Number of Steps: ~13 Home Layout: Able to live on main level with bedroom/bathroom;Laundry or work area in [employer] Equipment: Agricultural consultant (2 wheels);Shower seat;Rollator (4 wheels);Wheelchair - manual;Cane - single point      Prior Function            PT Goals (current goals can now be found in the care plan section) Acute Rehab PT Goals Patient Stated Goal: improve pain, mobility PT Goal Formulation: With patient Time For Goal Achievement: 11/11/22 Potential to Achieve Goals: Good Progress towards PT goals: Progressing toward goals;Goals  downgraded-see care plan    Frequency    Min 1X/week      PT Plan      Co-evaluation              AM-PAC PT "6 Clicks" Mobility   Outcome Measure  Help needed turning from your back to your side while in a flat bed without using bedrails?: A Lot Help needed moving from lying on your back to sitting on the side of a flat bed without using bedrails?: A Lot   Help needed standing up from a chair using your arms (e.g., wheelchair or bedside chair)?: Total Help needed to walk in hospital room?: Total Help needed climbing 3-5 steps with a railing? : Total 6 Click Score: 7    End of Session Equipment Utilized During Treatment: Gait belt Activity Tolerance: Patient limited by pain Patient left: in chair;with call bell/phone within reach;with chair alarm set Nurse Communication: Patient requests pain meds PT Visit Diagnosis: Other abnormalities of gait and mobility (R26.89);Pain;Other symptoms and signs involving the nervous system (R29.898) Pain - Right/Left: Left Pain - part of body: Hip     Time: 1003-1030 PT Time Calculation (min) (ACUTE ONLY): 27 min  Charges:    $Therapeutic  Exercise: 8-22 mins $Therapeutic Activity: 8-22 mins PT General Charges $$ ACUTE PT VISIT: 1 Visit                     Sheran Lawless, PT Acute Rehabilitation Services [phone] 10/28/2022    Elray Mcgregor 10/28/2022, 11:10 AM

## 2022-10-28 NOTE — Progress Notes (Signed)
Received patient in bed to unit.  Alert and oriented.  Informed consent signed and in chart.   TX duration:3.5  Patient tolerated well.  Transported back to the room  Alert, without acute distress.  Hand-off given to patient's nurse.   Access used: left AVF Access issues: prolong bleeding  Total UF removed: 2L Medication(s) given: none   10/28/22 1725  Vitals  Temp 97.7 F (36.5 C)  Temp Source Axillary  BP 103/68  MAP (mmHg) 79  BP Location Right Arm  BP Method Automatic  Patient Position (if appropriate) Lying  Pulse Rate (!) 58  ECG Heart Rate 82  Resp 18  Oxygen Therapy  SpO2 94 %  O2 Device Room Air  During Treatment Monitoring  Blood Flow Rate (mL/min) 349 mL/min  Arterial Pressure (mmHg) -151.1 mmHg  Venous Pressure (mmHg) 214.73 mmHg  TMP (mmHg) -2.22 mmHg  Ultrafiltration Rate (mL/min) 960 mL/min  Dialysate Flow Rate (mL/min) 300 ml/min  Dialysate Potassium Concentration 3  Dialysate Calcium Concentration 2.5  Duration of HD Treatment -hour(s) 3.48 hour(s)  Cumulative Fluid Removed (mL) per Treatment  1979.48  HD Safety Checks Performed Yes  Intra-Hemodialysis Comments Tx completed;Tolerated well  Dialysis Fluid Bolus Normal Saline  Bolus Amount (mL) 300 mL      Aloysious Vangieson [facility]

## 2022-10-28 NOTE — Progress Notes (Signed)
  [facility] Progress Note   Subjective:    Patient seen in room. No co's today. In good spirits.  Had some vertigo yesterday when upright For HD today  Objective Vitals:   10/27/22 1945 10/28/22 0017 10/28/22 0508 10/28/22 0723  BP: (!) 147/77 136/68 (!) 146/70 132/70  Pulse: 68 (!) 56 65 (!) 58  Resp: 20 20 18 16   Temp: 97.9 F (36.6 C) 97.8 F (36.6 C) 97.9 F (36.6 C) (!) 97.4 F (36.3 C)  TempSrc: Axillary Axillary Axillary Oral  SpO2: 97% 97% 95% 100%  Weight:   51.4 kg   Height:       Physical Exam General: Frail-appearing elderly female lying supine on hospital bed.  Heart:RRR, no M/R/G Lungs: clear bilat  Abdomen: soft, non-tender, non-distended.  Extremities: Trace pretib edema  Dialysis Access: LUA AVF +thrill   OP HD:  [facility] MWF   3.5h   55kg   350/1.5   2/2 bath  L AVF  Heparin none   Assessment/Plan:  # Frontal Contusion and ischemic CVA: Managed by neurosurgery/neurology. F/u CT was normal. She seems to be improved.   # ESRD - MWF HD. Cont MWF here. HD today on schedule .   # Hypertension: bp's stable. On po metoprolol here.   # Volume - Stable, euvolemic on exam. UF 1-2 L w/ HD tomorrow.    # Anemia of CKD: Trend CBCs. Hb 10-13.  No issues   # CKD-BMD: cont binders, P 6.5,  Ca corrects.    [name], MD  10/28/2022, 10:08 AM  Recent Labs  Lab 10/23/22 0845 10/24/22 1536 10/26/22 0314 10/27/22 0550 10/28/22 0906  HGB 10.6* 13.0  --   --  10.9*  ALBUMIN  --   --   --   --  2.0*  CALCIUM 7.6* 8.2*   < > 7.8* 7.6*  PHOS 8.2*  --   --   --  6.5*  CREATININE 4.32* 3.59*   < > 3.59* 4.23*  K 3.8 3.8   < > 3.4* 3.5   < > = values in this interval not displayed.    Inpatient medications:  acetaminophen  500 mg Oral TID   amiodarone  200 mg Oral BID   Followed by   Melene Muller ON 11/04/2022] amiodarone  200 mg Oral Daily   aspirin  81 mg Oral Daily   Chlorhexidine Gluconate Cloth  6 each Topical Daily   Chlorhexidine  Gluconate Cloth  6 each Topical Q0600   Chlorhexidine Gluconate Cloth  6 each Topical Q0600   insulin aspart  0-6 Units Subcutaneous TID WC   lipase/protease/amylase  36,000 Units Oral BID WC   metoprolol succinate  25 mg Oral Daily   prednisoLONE acetate  1 drop Both Eyes BID   rOPINIRole  0.5 mg Oral QHS   sevelamer carbonate  2,400 mg Oral TID WC   valACYclovir  500 mg Oral QHS    anticoagulant sodium citrate      acetaminophen, alteplase, anticoagulant sodium citrate, cycloSPORINE, docusate sodium, feeding supplement (NEPRO CARB STEADY), heparin, hydrALAZINE, lidocaine (PF), lidocaine-prilocaine, meclizine, olopatadine, ondansetron (ZOFRAN) IV, mouth rinse, pentafluoroprop-tetrafluoroeth, polyethylene glycol, rOPINIRole

## 2022-10-28 NOTE — Consult Note (Signed)
Physical Medicine and Rehabilitation Consult Reason for Consult:polytrauma with TBI Referring Physician: Regalado   HPI: Kim Burns is a 87 y.o. female with a history of atrial fibrillation, diabetes, hypertension, end-stage renal disease on hemodialysis who initially presented on 10/21/2022 after being found down on the floor by her family.  Patient did not remember a fall.  Upon assessment she was found to have a right frontal hemorrhagic contusion as well as a nondisplaced left parietal-occipital bone fracture, left posterior scalp hematoma and a small nondisplaced S4 fracture.  On follow-up CT dated 10/24/2022 small new left greater than right subdural hematomas were found.  Left inferior pubic ramus fracture also was discovered.  Orthopedics recommended no interventions as fracture is minimally displaced, and she is weightbearing as tolerated.  On 10/26/2022 an MRI also noted small acute ischemic stroke in the posterior right frontal region with additional possible punctate infarct involving the right anterior frontal lobe.  This infarct was felt to be cardioembolic due to the fact that patient was off her anticoagulation for A-fib.  Patient was up with physical therapy today and was max assist for sit to stand transfers and ambulated 15 feet min assist.  Patient lives with her son and daughter and generally ambulates without a device although she does have a rollator.   Review of Systems  Constitutional:  Negative for fever.  HENT: Negative.    Eyes: Negative.   Respiratory: Negative.    Cardiovascular: Negative.   Gastrointestinal:  Positive for nausea.  Genitourinary: Negative.   Musculoskeletal:  Positive for back pain, joint pain and myalgias.  Skin: Negative.   Neurological:  Positive for focal weakness and weakness.  Psychiatric/Behavioral:  Positive for memory loss.    Past Medical History:  Diagnosis Date   Anemia of chronic disease    takes iron   Anxiety    Blood  transfusion without reported diagnosis    Bradycardia    Diabetes mellitus without complication (HCC)    became diabetic after Whipple procedure   Diarrhea    Dysrhythmia 04/16/2016   bradycardia due to medication    ESRD on hemodialysis [facility])    M-W-F   GERD (gastroesophageal reflux disease)    Gout    Headache    History of kidney stones 06/2013   Hyperparathyroidism (HCC)    Hypertension    PONV (postoperative nausea and vomiting)    Sleep apnea    no cpap machine. could not tolerate   Stroke (HCC) 04/16/2016   TIA 1995   Vitamin D deficiency    Past Surgical History:  Procedure Laterality Date   A/V FISTULAGRAM Left 08/30/2021   Procedure: A/V Fistulagram;  Surgeon: Cephus Shelling, MD;  Location: [facility] INVASIVE CV LAB;  Service: Cardiovascular;  Laterality: Left;   ABDOMINAL HYSTERECTOMY  1985   complete   BACK SURGERY  1980   lower   BASCILIC VEIN TRANSPOSITION Left 04/24/2017   Procedure: LEFT ARM FIRST STAGE BASILIC VEIN TRANSPOSITION;  Surgeon: Nada Libman, MD;  Location: [facility] OR;  Service: Vascular;  Laterality: Left;   BASCILIC VEIN TRANSPOSITION Left 07/10/2017   Procedure: SECOND STAGE BASILIC VEIN TRANSPOSITION LEFT ARM;  Surgeon: Nada Libman, MD;  Location: [facility] OR;  Service: Vascular;  Laterality: Left;   BREAST LUMPECTOMY Left x 2   many years apart, benign   CHOLECYSTECTOMY  1985   CYSTOSCOPY W/ URETERAL STENT PLACEMENT Left 10/31/2021   Procedure: CYSTOSCOPY WITH RETROGRADE PYELOGRAM/URETERAL STENT PLACEMENT;  Surgeon: Crist Fat, MD;  Location: [facility] OR;  Service: Urology;  Laterality: Left;   CYSTOSCOPY WITH URETEROSCOPY AND STENT PLACEMENT Left 06/18/2013   Procedure: CYSTOSCOPY WITH Lef URETEROSCOPY AND Left STENT PLACEMENT;  Surgeon: Crecencio Mc, MD;  Location: [facility] ORS;  Service: Urology;  Laterality: Left;   ESOPHAGOGASTRODUODENOSCOPY (EGD) WITH PROPOFOL N/A 11/13/2016   Procedure: ESOPHAGOGASTRODUODENOSCOPY (EGD) WITH PROPOFOL;  Surgeon:  Kathi Der, MD;  Location: [facility] ENDOSCOPY;  Service: Gastroenterology;  Laterality: N/A;   EUS N/A 02/07/2016   Procedure: ESOPHAGEAL ENDOSCOPIC ULTRASOUND (EUS) RADIAL;  Surgeon: Willis Modena, MD;  Location: [facility] ENDOSCOPY;  Service: Endoscopy;  Laterality: N/A;   EYE SURGERY Bilateral 2014   ioc for catracts    FOOT SURGERY Left 1990   something with toes unsure what    HERNIA REPAIR  2018   IR CATHETER TUBE CHANGE  03/14/2022   IR EMBO TUMOR ORGAN ISCHEMIA INFARCT INC GUIDE ROADMAPPING  08/22/2021   IR NEPHROSTOMY EXCHANGE LEFT  06/27/2022   IR NEPHROSTOMY EXCHANGE LEFT  08/26/2022   IR NEPHROSTOMY EXCHANGE LEFT  10/22/2022   IR NEPHROSTOMY PLACEMENT LEFT  03/14/2022   IR RADIOLOGIST EVAL & MGMT  05/18/2021   IR RADIOLOGIST EVAL & MGMT  10/01/2021   IR RADIOLOGIST EVAL & MGMT  11/12/2021   IR RADIOLOGIST EVAL & MGMT  11/22/2021   IR RADIOLOGIST EVAL & MGMT  11/27/2021   IR RADIOLOGIST EVAL & MGMT  12/20/2021   IR RENAL SUPRASEL UNI S&I MOD SED  08/22/2021   IR US GUIDE VASC ACCESS RIGHT  08/22/2021   LEFT HEART CATH AND CORONARY ANGIOGRAPHY N/A 08/27/2021   Procedure: LEFT HEART CATH AND CORONARY ANGIOGRAPHY;  Surgeon: Kathleene Hazel, MD;  Location: [facility] INVASIVE CV LAB;  Service: Cardiovascular;  Laterality: N/A;   PARATHYROID EXPLORATION     PERIPHERAL VASCULAR BALLOON ANGIOPLASTY Left 08/30/2021   Procedure: PERIPHERAL VASCULAR BALLOON ANGIOPLASTY;  Surgeon: Cephus Shelling, MD;  Location: [facility] INVASIVE CV LAB;  Service: Cardiovascular;  Laterality: Left;  arm fistula   RADIOLOGY WITH ANESTHESIA Left 08/22/2021   Procedure: MICROWAVE ABLATION;  Surgeon: Simonne Come, MD;  Location: [facility] ORS;  Service: Anesthesiology;  Laterality: Left;   WHIPPLE PROCEDURE N/A 04/23/2016   Procedure: WHIPPLE PROCEDURE;  Surgeon: Almond Lint, MD;  Location: [facility] OR;  Service: General;  Laterality: N/A;   Family History  Problem Relation Age of Onset   Heart disease Mother    Stroke Mother    Cancer  Father        colon   Alzheimer's disease Sister    Alzheimer's disease Sister    Cancer Brother        brain   Breast cancer Neg Hx    Social History:  reports that she has never smoked. She has never used smokeless tobacco. She reports that she does not drink alcohol and does not use drugs. Allergies:  Allergies  Allergen Reactions   Lopid [Gemfibrozil] Other (See Comments)    Myalgia    Lotemax [Loteprednol Etabonate] Rash    Skin burning  Blurry vision   Statins Other (See Comments)    Rosuvastatin Muscle weakness   Norco [Hydrocodone-Acetaminophen] Other (See Comments)    Headaches  Tolerates acetaminophen    Other Diarrhea    Real Butter - diarrhea   Vibra-Tab [Doxycycline] Other (See Comments)    Unknown reaction   Amlodipine Other (See Comments)    Norvasc   Codeine Other (See Comments)    headache  Hydrocodone Other (See Comments)   Hydrocodone-Acetaminophen Other (See Comments)    Headache. Tolerates acetaminophen.   Lisinopril Other (See Comments)   Verapamil Other (See Comments)   Medications Prior to Admission  Medication Sig Dispense Refill   amiodarone (PACERONE) 200 MG tablet Take 1 tablet (200 mg total) by mouth daily. 90 tablet 3   B Complex-C-Zn-Folic Acid (DIALYVITE 800 WITH ZINC) 0.8 MG TABS Take 1 tablet by mouth daily.     colestipol (COLESTID) 1 g tablet Take 1 g by mouth See admin instructions. Take 1g (1 tablet) by mouth on MWF before dialysis in addition to 1g (1 tablet) as needed for diarrhea.     diclofenac Sodium (VOLTAREN) 1 % GEL Apply 2 g topically 3 (three) times daily as needed (pain). 100 g 0   diphenhydramine-acetaminophen (TYLENOL PM) 25-500 MG TABS tablet Take 2 tablets by mouth at bedtime.     folic acid (FOLVITE) 1 MG tablet Take 1 tablet (1 mg total) by mouth daily. (Patient taking differently: Take 800 mcg by mouth daily.)     insulin aspart (NOVOLOG) 100 UNIT/ML injection Inject 0-6 Units into the skin 3 (three) times daily  with meals. (Patient taking differently: Inject 0-6 Units into the skin See admin instructions. Only if sugar is >200 for at least an hour) 10 mL 11   lidocaine-prilocaine (EMLA) cream Apply 1 Application topically every Monday, Wednesday, and Friday with hemodialysis.     lipase/protease/amylase (CREON) 36000 UNITS CPEP capsule Take 36,000-72,000 Units by mouth See admin instructions. Take 72,000 units (2 capsules) by mouth with meals and 36,000 units (1 capsule) with snacks.     Menthol-Methyl Salicylate (MUSCLE RUB) 10-15 % CREA Apply 1 Application topically as needed for muscle pain.     metoprolol tartrate (LOPRESSOR) 50 MG tablet Take 1 tablet (50 mg total) by mouth 2 (two) times daily. 120 tablet 1   midodrine (PROAMATINE) 10 MG tablet Take 10 mg on dialysis days Mon-Wed-Fri     olopatadine (PATADAY) 0.1 % ophthalmic solution Place 1 drop into both eyes 2 (two) times daily as needed for allergies.     rOPINIRole (REQUIP) 0.25 MG tablet Take 1 tablet (0.25 mg total) by mouth 2 (two) times daily. (Patient taking differently: Take 0.25-0.5 mg by mouth See admin instructions. Take 0.5mg  (1 tablet) by mouth every night at bedtime and 0.25mg  (1/2 tablet) in the mornings as needed.) 60 tablet 1   SENSIPAR 30 MG tablet Take 30 mg by mouth every Monday, Wednesday, and Friday.     sevelamer carbonate (RENVELA) 800 MG tablet Take 1,600-2,400 mg by mouth See admin instructions. Take 2,400mg  (3 tablets) by mouth with meals and 1,600mg  (2 tablets) with snacks     valACYclovir (VALTREX) 500 MG tablet Take 1 tablet (500 mg total) by mouth at bedtime. 14 tablet 0   Brinzolamide-Brimonidine 1-0.2 % SUSP Apply 1 drop to eye 2 (two) times daily. (Patient not taking: Reported on 10/22/2022)     erythromycin ophthalmic ointment Place into the left eye 2 (two) times daily. (Patient not taking: Reported on 10/22/2022) 3.5 g 0   prednisoLONE acetate (PRED FORTE) 1 % ophthalmic suspension Place 1 drop into the left eye 2  (two) times daily. (Patient not taking: Reported on 10/22/2022)     RESTASIS 0.05 % ophthalmic emulsion Place 1 drop into both eyes as needed (yes irratation). (Patient not taking: Reported on 10/22/2022)      Home: Home Living Family/patient expects to be discharged to:: Private  residence Living Arrangements: Children (daughter assists in "summertime" son assists when daughter is not present,son works from home) Available Help at Discharge: Family, Available 24 hours/day Type of Home: House Home Access: Stairs to enter [employer] of Steps: 4 to get in the house Entrance Stairs-Rails: Left, Right Home Layout: Able to live on main level with bedroom/bathroom, Laundry or work area in basement Alternate Teacher, music of Steps: ~13 Alternate Level Stairs-Rails: Left Bathroom Shower/Tub: Health visitor: Handicapped height Bathroom Accessibility: Yes Home Equipment: Agricultural consultant (2 wheels), Information systems manager, Rollator (4 wheels), Wheelchair - manual, Cane - single point Additional Comments: Pt appears to be an unreliable reporter. During this session, pt reports that son and daughter stay with her 24/7, that son and daughter are taking turns staying with pt, that daughter is now teaching in [place], that no one was home when she fell, that her daughter was downstairs and she was upstairs when she fell, and that she does not go upstairs. Per chart, family found pt asleep on the floor when son-in-law arrived to take pt to HD.  Lives With: Kim Burns (daughter stays as well when she's in town)  Functional History: Prior Function Prior Level of Function : Independent/Modified Independent Mobility Comments: has a rollator but has not used yet, does not use AD at home ADLs Comments: Pt reports Supervision for ADLs and IADLs from son and daughter. Functional Status:  Mobility: Bed Mobility Overal bed mobility: Needs Assistance Bed Mobility: Sidelying to Sit Rolling: Min assist,  Used rails Sidelying to sit: Mod assist Supine to sit: Min assist Sit to supine: Min assist General bed mobility comments: mod A for trunk elevation Transfers Overall transfer level: Needs assistance Equipment used: Rolling walker (2 wheels) Transfers: Sit to/from Stand Sit to Stand: Mod assist General transfer comment: from elevated bed height Ambulation/Gait Ambulation/Gait assistance: Min assist Gait Distance (Feet): 5 Feet (fwd then back) Assistive device: Rolling walker (2 wheels) Gait Pattern/deviations: Step-to pattern, Shuffle, Trunk flexed, Antalgic, Decreased stance time - left General Gait Details: cues for using UE's on walker to unweight painful L LE when in stance. cues for forward gaze on stationary target due to dizziness symptoms Gait velocity interpretation: <1.31 ft/sec, indicative of household ambulator    ADL: ADL Overall ADL's : Needs assistance/impaired Eating/Feeding: Supervision/ safety Grooming: Wash/dry face, Sitting Upper Body Bathing: Moderate assistance, Sitting Lower Body Bathing: Moderate assistance, Sitting/lateral leans Upper Body Dressing : Moderate assistance, Sitting Lower Body Dressing: Moderate assistance, Sitting/lateral leans Toilet Transfer: Moderate assistance, Squat-pivot, Stand-pivot, BSC/3in1 Toileting- Clothing Manipulation and Hygiene: Moderate assistance Functional mobility during ADLs: Moderate assistance, Rolling walker (2 wheels) General ADL Comments: ADLs assessed from bed level secondary to pt declining sitting EOB or in recliner secondary to pt reporting concern over A Fib and pain in Left thigh. OT educated pt in importance of OOB activity and in RN giving pt the OK to participate in OT session with pt continuing to decline.  Cognition: Cognition Overall Cognitive Status: Impaired/Different from baseline Arousal/Alertness: Lethargic Orientation Level: Oriented X4 Year: 2024 Month: September Day of Week:  Correct Attention: Sustained Sustained Attention: Appears intact Memory: Impaired Memory Impairment: Decreased recall of new information Awareness: Appears intact Safety/Judgment: Appears intact Cognition Arousal: Alert Behavior During Therapy: WFL for tasks assessed/performed Overall Cognitive Status: Impaired/Different from baseline Area of Impairment: Problem solving, Attention Current Attention Level: Selective Problem Solving: Slow processing General Comments: A & O x4, some slowed processing needing increased cues to follow commands, aware she is in  the hospital for a fall and also had a stroke  Blood pressure (!) 153/69, pulse (!) 57, temperature 97.6 F (36.4 C), temperature source Oral, resp. rate 12, height 5\' 3"  (1.6 m), weight 51.4 kg, SpO2 98%. Physical Exam Constitutional:      General: She is not in acute distress. HENT:     Head:     Comments: Healing, bruising left face/scalp    Nose: Nose normal.  Eyes:     Extraocular Movements: Extraocular movements intact.     Pupils: Pupils are equal, round, and reactive to light.  Cardiovascular:     Rate and Rhythm: Regular rhythm. Bradycardia present.  Pulmonary:     Effort: Pulmonary effort is normal.  Abdominal:     Palpations: Abdomen is soft.  Musculoskeletal:        General: Tenderness (left hip) present. Normal range of motion.     Cervical back: Normal range of motion.  Skin:    General: Skin is warm.     Findings: Bruising present.  Neurological:     Mental Status: She is alert.     Comments: Alert and oriented x 3. Fair insight and awareness. Fair Memory. Normal language and speech. Cranial nerve exam unremarkable. MMT: RUE 4+/5, LUE 4/5. RLE 3/5 prox to 4/5 distally. LLE 1+ (pain) prox to 4/5 distally. No focal sensory abnl. No abnl resting tone. DTR's 1+   Psychiatric:        Mood and Affect: Mood normal.        Behavior: Behavior normal.     Results for orders placed or performed during the  hospital encounter of 10/21/22 (from the past 24 hour(s))  Glucose, capillary     Status: Abnormal   Collection Time: 10/27/22  4:44 PM  Result Value Ref Range   Glucose-Capillary 149 (H) 70 - 99 mg/dL  Glucose, capillary     Status: Abnormal   Collection Time: 10/27/22  8:51 PM  Result Value Ref Range   Glucose-Capillary 150 (H) 70 - 99 mg/dL  Glucose, capillary     Status: Abnormal   Collection Time: 10/28/22  5:46 AM  Result Value Ref Range   Glucose-Capillary 106 (H) 70 - 99 mg/dL  CBC     Status: Abnormal   Collection Time: 10/28/22  9:06 AM  Result Value Ref Range   WBC 8.8 4.0 - 10.5 K/uL   RBC 3.55 (L) 3.87 - 5.11 MIL/uL   Hemoglobin 10.9 (L) 12.0 - 15.0 g/dL   HCT 16.1 (L) 09.6 - 04.5 %   MCV 94.1 80.0 - 100.0 fL   MCH 30.7 26.0 - 34.0 pg   MCHC 32.6 30.0 - 36.0 g/dL   RDW 40.9 (H) 81.1 - 91.4 %   Platelets 214 150 - 400 K/uL   nRBC 0.0 0.0 - 0.2 %  Renal function panel     Status: Abnormal   Collection Time: 10/28/22  9:06 AM  Result Value Ref Range   Sodium 130 (L) 135 - 145 mmol/L   Potassium 3.5 3.5 - 5.1 mmol/L   Chloride 93 (L) 98 - 111 mmol/L   CO2 23 22 - 32 mmol/L   Glucose, Bld 149 (H) 70 - 99 mg/dL   BUN 38 (H) 8 - 23 mg/dL   Creatinine, Ser 7.82 (H) 0.44 - 1.00 mg/dL   Calcium 7.6 (L) 8.9 - 10.3 mg/dL   Phosphorus 6.5 (H) 2.5 - 4.6 mg/dL   Albumin 2.0 (L) 3.5 - 5.0 g/dL  GFR, Estimated 10 (L) >60 mL/min   Anion gap 14 5 - 15  Glucose, capillary     Status: Abnormal   Collection Time: 10/28/22 11:18 AM  Result Value Ref Range   Glucose-Capillary 116 (H) 70 - 99 mg/dL  Glucose, capillary     Status: Abnormal   Collection Time: 10/28/22 11:57 AM  Result Value Ref Range   Glucose-Capillary 111 (H) 70 - 99 mg/dL   EEG adult  Result Date: 10/27/2022 Charlsie Quest, MD     10/27/2022  4:56 PM Patient Name: MILAGRO WEISBECKER MRN: [id number] Epilepsy Attending: Charlsie Quest Referring Physician/Provider: Tomie China, MD Date: 10/27/2022 Duration:  23.23 mins Patient history: 87yo F with ams getting eeg to evaluate for seizure Level of alertness: Awake, asleep AEDs during EEG study: None Technical aspects: This EEG study was done with scalp electrodes positioned according to the 10-20 International system of electrode placement. Electrical activity was reviewed with band pass filter of 1-70Hz , sensitivity of 7 uV/mm, display speed of 78mm/sec with a 60Hz  notched filter applied as appropriate. EEG data were recorded continuously and digitally stored.  Video monitoring was available and reviewed as appropriate. Description: The posterior dominant rhythm consists of 8-9 Hz activity of moderate voltage (25-35 uV) seen predominantly in posterior head regions, symmetric and reactive to eye opening and eye closing.Sleep was characterized by vertex waves, sleep spindles (12 to 14 Hz), maximal frontocentral region. EEG showed intermittent generalized 3 to 6 Hz theta-delta slowing. Physiologic photic driving was not seen during photic stimulation.  Hyperventilation was not performed.   ABNORMALITY - Intermittent slow, generalized IMPRESSION: This study is suggestive of mild diffuse encephalopathy, nonspecific etiology.No seizures or epileptiform discharges were seen throughout the recording. Priyanka O Yadav   VAS US CAROTID (at [facility] and [facility] only)  Result Date: 10/27/2022 Carotid Arterial Duplex Study Patient Name:  JASMYNE SIEMINSKI  Date of Exam:   10/27/2022 Medical Rec #: [id number]         Accession #:    [id number] Date of Birth: 05-24-1935         Patient Gender: F Patient Age:   35 years Exam Location:  [facility] Procedure:      VAS US CAROTID Referring Phys: Dewitt Hoes DE LA TORRE --------------------------------------------------------------------------------  Indications:      CVA. Risk Factors:     Hypertension, Diabetes, coronary artery disease. History of                   TIA. Other Factors:    Recent TBI. Comparison Study: No prior studies. Performing  Technologist: Jean Rosenthal RDMS, RVT  Examination Guidelines: A complete evaluation includes B-mode imaging, spectral Doppler, color Doppler, and power Doppler as needed of all accessible portions of each vessel. Bilateral testing is considered an integral part of a complete examination. Limited examinations for reoccurring indications may be performed as noted.  Right Carotid Findings: +----------+--------+--------+--------+------------------+------------------+           PSV cm/sEDV cm/sStenosisPlaque DescriptionComments           +----------+--------+--------+--------+------------------+------------------+ CCA Prox  64      11                                                   +----------+--------+--------+--------+------------------+------------------+ CCA Distal47      9  intimal thickening +----------+--------+--------+--------+------------------+------------------+ ICA Prox  41      9               heterogenous mild                    +----------+--------+--------+--------+------------------+------------------+ ICA Distal56      12                                                   +----------+--------+--------+--------+------------------+------------------+ ECA       52                                                           +----------+--------+--------+--------+------------------+------------------+ +----------+--------+-------+----------------+-------------------+           PSV cm/sEDV cmsDescribe        Arm Pressure (mmHG) +----------+--------+-------+----------------+-------------------+ [id number]             Multiphasic, WNL                    +----------+--------+-------+----------------+-------------------+ +---------+--------+--+--------+-+---------+ VertebralPSV cm/s42EDV cm/s9Antegrade +---------+--------+--+--------+-+---------+  Left Carotid Findings:  +----------+--------+--------+--------+------------------+------------------+           PSV cm/sEDV cm/sStenosisPlaque DescriptionComments           +----------+--------+--------+--------+------------------+------------------+ CCA Prox  59      9                                                    +----------+--------+--------+--------+------------------+------------------+ CCA Distal51      11                                intimal thickening +----------+--------+--------+--------+------------------+------------------+ ICA Prox  29      7               heterogenous mild                    +----------+--------+--------+--------+------------------+------------------+ ICA Distal46      12                                                   +----------+--------+--------+--------+------------------+------------------+ ECA       40                                                           +----------+--------+--------+--------+------------------+------------------+ +----------+--------+--------+----------------+-------------------+           PSV cm/sEDV cm/sDescribe        Arm Pressure (mmHG) +----------+--------+--------+----------------+-------------------+ Subclavian130             Multiphasic, WNL                    +----------+--------+--------+----------------+-------------------+ +---------+--------+--+--------+-+---------+  VertebralPSV cm/s33EDV cm/s5Antegrade +---------+--------+--+--------+-+---------+   Summary: Right Carotid: The extracranial vessels were near-normal with only minimal wall                thickening or plaque. Left Carotid: The extracranial vessels were near-normal with only minimal wall               thickening or plaque. Vertebrals:  Bilateral vertebral arteries demonstrate antegrade flow. Subclavians: Normal flow hemodynamics were seen in bilateral subclavian              arteries. *See table(s) above for measurements and  observations.  Electronically signed by Delia Heady MD on 10/27/2022 at 12:24:42 PM.    Final    MR ANGIO HEAD WO CONTRAST  Result Date: 10/26/2022 CLINICAL DATA:  Stroke, follow-up.  Traumatic injury. EXAM: MRA HEAD WITHOUT CONTRAST TECHNIQUE: Angiographic images of the Circle of Willis were acquired using MRA technique without intravenous contrast. COMPARISON:  MRI of the head without contrast 10/25/2022 FINDINGS: Anterior circulation: The internal carotid arteries are within normal limits from the high cervical segments through the ICA termini. The A1 and M1 segments are normal. The anterior communicating artery is patent. The ACA and MCA branch vessels are normal bilaterally. Posterior circulation: The right vertebral artery is dominant. The vertebrobasilar junction basilar artery normal. Both posterior cerebral arteries originate from basilar tip. Posterior communicating arteries are patent bilaterally. The PCA branch vessels are within normal limits. Anatomic variants: None IMPRESSION: Normal MRA circle-of-Willis without evidence for significant proximal stenosis, aneurysm, or branch vessel occlusion. Electronically Signed   By: Marin Roberts M.D.   On: 10/26/2022 19:04    Assessment/Plan: Diagnosis: 87 yo female who suffered TBI/polytrauma after fall, associated right frontal  lobe infarcts Does the need for close, 24 hr/day medical supervision in concert with the patient's rehab needs make it unreasonable for this patient to be served in a less intensive setting? Yes Co-Morbidities requiring supervision/potential complications:  - Due to bladder management, bowel management, safety, skin/wound care, disease management, medication administration, pain management, and patient education, does the patient require 24 hr/day rehab nursing? Yes Does the patient require coordinated care of a physician, rehab nurse, therapy disciplines of PT, OT, SLP to address physical and functional deficits in the  context of the above medical diagnosis(es)? Yes Addressing deficits in the following areas: balance, endurance, locomotion, strength, transferring, bowel/bladder control, bathing, dressing, feeding, grooming, toileting, cognition, and psychosocial support Can the patient actively participate in an intensive therapy program of at least 3 hrs of therapy per day at least 5 days per week? Yes The potential for patient to make measurable gains while on inpatient rehab is excellent Anticipated functional outcomes upon discharge from inpatient rehab are modified independent and supervision  with PT, modified independent and supervision with OT, modified independent with SLP. Estimated rehab length of stay to reach the above functional goals is: 11-15 days Anticipated discharge destination: Home Overall Rehab/Functional Prognosis: excellent  POST ACUTE RECOMMENDATIONS: This patient's condition is appropriate for continued rehabilitative care in the following setting: CIR Patient has agreed to participate in recommended program. Yes Note that insurance prior authorization may be required for reimbursement for recommended care.  Comment: Rehab Admissions Coordinator to follow up.       I have personally performed a face to face diagnostic evaluation of this patient. Additionally, I have examined the patient's medical record including any pertinent labs and radiographic images. If the physician assistant has documented in this note, I have reviewed and  edited or otherwise concur with the physician assistant's documentation.  Thanks,  Ranelle Oyster, MD 10/28/2022

## 2022-10-28 NOTE — Progress Notes (Signed)
PROGRESS NOTE    Kim Burns  MWN:[id number] DOB: [birth year] DOA: 10/21/2022 PCP: Lewis Moccasin, MD   Brief Narrative: 87 year old past medical history significant for Whipple procedure, small bowel resection, due to nonmalignant pancreatic mass, RCC with left renal embolization and ablation with nephrostomy tube in place, hypertension, A-fib not on anticoagulation, ESRD on hemodialysis MWF, her son-in-law went to pick her up for hemodialysis on 9/2 and found her asleep on the floor.  She does not remember falling.  Evaluation in the ED patient was found to have a right frontal hemorrhagic contusion and nondisplaced left parietal and occipital fracture, left posterior scalp hematoma, small nondisplaced S4 fracture.  Patient admitted for traumatic brain injury after a fall with apparent loss of consciousness versus mechanical fall with intact consciousness.  Patient was evaluated by neurosurgery, admitted to the ICU,  systolic blood pressure recommended 90-160.  Assessment & Plan:   Principal Problem:   Traumatic brain injury [facility]) Active Problems:   Contusion of right cerebral hemisphere [facility])   Closed skull fracture (HCC)   Sacral fracture, closed (HCC)   Exocrine pancreatic insufficiency   Hypertension   Acute systolic heart failure (HCC)   Atrial fibrillation, rapid (HCC)   Pressure injury of skin   Lacunar infarct, acute (HCC)  1-TBI, Right frontal contrecoup hemorrhagic cerebral contusion -Nondisplaced left parietal and left occipital skull fracture -Scalp hematoma -Skull fracture: -CT head is stable.  Per neurosurgery continue to hold aspirin for 1 week, may start Lovenox for DVT prophylaxis in 72 hours from 9/3 -Awaiting PT OT recommendation -had some confusion during course hospitalization.  -Patient became more confuse 9/04. Repeated CT head 9/05 showed stable hemorrhagic contusion and two small hygroma. -report dizziness, and lower extremity weakness, will proceed  with MRI brain instead of CT scan.  -Less confused. Report dizziness with movement of head, 9/06. Meclizine PRN/  -On try tylenol for pain, family concern with tramadol causing Hallucination. Discontinue tramadol.  Stable, still having dizziness.   Acute/subacute ischemic nonhemorrhagic Infarct:  -MRI brain:  9/06. 7 mm focus of mild diffusion signal abnormality involving the subcortical posterior right frontal region, suspicious for a small acute to early subacute ischemic nonhemorrhagic infarct. Possible additional punctate infarct involving the anterior right frontal lobe as above. -ECHO: EF 45-50 % global hypokinesis. Per cardiology likely related to A fib. Out patient evaluation.  -neurology consulted Carotid doppler negative.  Started on baby aspirin by neurology   A fib RVR;  New acute systolic, Heart failure.  Develops A fib RVR, started on IV amiodarone on 9/04.  Appreciate cardiology evaluation.  Metoprolol resume 9/06 On oral amiodarone. Needs amiodarone 200 mg BID for 1 week then 200 mg daily.  No anticoagulation due to prior history of bleeding pre nephrostomy tube and now new hemorrhagic cerebral contusion.   -Hypertension/hypotension -She gets midodrine with hemodialysis, currently on hold  ESRD on hemodialysis -MWF Appreciate nephrology assistance   RCC s/p embolization/ablation, status post left nephrostomy tube Status post nephrostomy tube exchange on Status post left nephrostomy tube exchange 9/3   S/ post Whipple 2018 Exocrine pancreatic insufficiency  IDDM:  -SSI  Frequent fall.  left inferior pubic ramus may represent a nondisplaced fracture Subtle nondisplaced fracture of the anterior cortex of the sacrum at S4. Pain management.  PT/OT recommend CIR Report hip pain: x ray: Irregularity of the left inferior pubic ramus may represent a nondisplaced fracture in the appropriate clinical setting.  Mild left hip osteoarthritis MRI; acute , sub acute  stroke.   ECHO> reduce EF 45 % CT: Minimally displaced fracture of the left inferior pubic ramus and probable nondisplaced fracture of the left superior pubic ramus.  Restless leg Syndrome:  Resume home dose requip.   Estimated body mass index is 21.56 kg/m as calculated from the following:   Height as of this encounter: 5\' 3"  (1.6 m).   Weight as of this encounter: 55.2 kg.   DVT prophylaxis: SCD Code Status: Full code Family Communication: Son over phone 9/06 Disposition Plan:  Status is: Inpatient Remains inpatient appropriate because: management of cerebral contusion.     Consultants:  Neurosurgery Nephrology   Procedures:  none  Antimicrobials:    Subjective: She is alert, denies pain, report dizziness.  She is willing to try CIR>   Objective: Vitals:   10/28/22 1400 10/28/22 1430 10/28/22 1500 10/28/22 1530  BP: (!) 155/70 (!) 151/73 139/70 (!) 147/72  Pulse: (!) 56 61 (!) 59 69  Resp: (!) 21 20 19 18   Temp:      TempSrc:      SpO2: 100% 100% 100% 100%  Weight:      Height:        Intake/Output Summary (Last 24 hours) at 10/28/2022 1538 Last data filed at 10/28/2022 0734 Gross per 24 hour  Intake 357 ml  Output --  Net 357 ml   Filed Weights   10/27/22 0614 10/28/22 0508 10/28/22 1326  Weight: 52.6 kg 51.4 kg 55.2 kg    Examination:  General exam: NAD Respiratory system: CTA Cardiovascular system: S 1, S 2 RRR Gastrointestinal system: BS present, soft, nt Central nervous system: Alert, follows command Extremities: No edema  Data Reviewed: I have personally reviewed following labs and imaging studies  CBC: Recent Labs  Lab 10/23/22 0845 10/24/22 1536 10/28/22 0906  WBC 8.7 7.7 8.8  HGB 10.6* 13.0 10.9*  HCT 32.8* 40.4 33.4*  MCV 94.8 96.4 94.1  PLT 179 195 214   Basic Metabolic Panel: Recent Labs  Lab 10/23/22 0603 10/23/22 0845 10/24/22 1536 10/26/22 0314 10/27/22 0550 10/28/22 0906  NA 133* 132* 130* 131* 132* 130*  K 5.1  3.8 3.8 3.4* 3.4* 3.5  CL 95* 95* 90* 91* 91* 93*  CO2 19* 23 23 24 25 23   GLUCOSE 114* 111* 145* 136* 123* 149*  BUN 45* 46* 32* 19 30* 38*  CREATININE 4.34* 4.32* 3.59* 2.72* 3.59* 4.23*  CALCIUM 8.1* 7.6* 8.2* 7.8* 7.8* 7.6*  PHOS 8.6* 8.2*  --   --   --  6.5*   GFR: Estimated Creatinine Clearance: 8 mL/min (A) (by C-G formula based on SCr of 4.23 mg/dL (H)). Liver Function Tests: Recent Labs  Lab 10/28/22 0906  ALBUMIN 2.0*   No results for input(s): "LIPASE", "AMYLASE" in the last 168 hours. No results for input(s): "AMMONIA" in the last 168 hours. Coagulation Profile: No results for input(s): "INR", "PROTIME" in the last 168 hours. Cardiac Enzymes: No results for input(s): "CKTOTAL", "CKMB", "CKMBINDEX", "TROPONINI" in the last 168 hours. BNP (last 3 results) No results for input(s): "PROBNP" in the last 8760 hours. HbA1C: Recent Labs    10/27/22 1045  HGBA1C 6.1*   CBG: Recent Labs  Lab 10/27/22 1644 10/27/22 2051 10/28/22 0546 10/28/22 1118 10/28/22 1157  GLUCAP 149* 150* 106* 116* 111*   Lipid Profile: Recent Labs    10/27/22 0550  CHOL 104  HDL 49  LDLCALC 38  TRIG 84  CHOLHDL 2.1   Thyroid Function Tests: No results for  input(s): "TSH", "T4TOTAL", "FREET4", "T3FREE", "THYROIDAB" in the last 72 hours. Anemia Panel: No results for input(s): "VITAMINB12", "FOLATE", "FERRITIN", "TIBC", "IRON", "RETICCTPCT" in the last 72 hours. Sepsis Labs: No results for input(s): "PROCALCITON", "LATICACIDVEN" in the last 168 hours.  Recent Results (from the past 240 hour(s))  MRSA Next Gen by PCR, Nasal     Status: None   Collection Time: 10/21/22 12:32 PM   Specimen: Nasal Mucosa; Nasal Swab  Result Value Ref Range Status   MRSA by PCR Next Gen NOT DETECTED NOT DETECTED Final    Comment: (NOTE) The GeneXpert MRSA Assay (FDA approved for NASAL specimens only), is one component of a comprehensive MRSA colonization surveillance program. It is not intended to  diagnose MRSA infection nor to guide or monitor treatment for MRSA infections. Test performance is not FDA approved in patients less than 48 years old. Performed at [facility], [address], [place], [place] [address]          Radiology Studies: EEG adult  Result Date: 11/17/2022 Charlsie Quest, MD     11-17-22  4:56 PM Patient Name: Kim Burns MRN: [id number] Epilepsy Attending: Charlsie Quest Referring Physician/Provider: Tomie China, MD Date: Nov 17, 2022 Duration: 23.23 mins Patient history: 87yo F with ams getting eeg to evaluate for seizure Level of alertness: Awake, asleep AEDs during EEG study: None Technical aspects: This EEG study was done with scalp electrodes positioned according to the 10-20 International system of electrode placement. Electrical activity was reviewed with band pass filter of 1-70Hz , sensitivity of 7 uV/mm, display speed of 4mm/sec with a 60Hz  notched filter applied as appropriate. EEG data were recorded continuously and digitally stored.  Video monitoring was available and reviewed as appropriate. Description: The posterior dominant rhythm consists of 8-9 Hz activity of moderate voltage (25-35 uV) seen predominantly in posterior head regions, symmetric and reactive to eye opening and eye closing.Sleep was characterized by vertex waves, sleep spindles (12 to 14 Hz), maximal frontocentral region. EEG showed intermittent generalized 3 to 6 Hz theta-delta slowing. Physiologic photic driving was not seen during photic stimulation.  Hyperventilation was not performed.   ABNORMALITY - Intermittent slow, generalized IMPRESSION: This study is suggestive of mild diffuse encephalopathy, nonspecific etiology.No seizures or epileptiform discharges were seen throughout the recording. Priyanka O Yadav   VAS US CAROTID (at [facility] and [facility] only)  Result Date: 11-17-2022 Carotid Arterial Duplex Study Patient Name:  Kim Burns  Date of Exam:   17-Nov-2022 Medical Rec  #: [id number]         Accession #:    [id number] Date of Birth: 20-Oct-1935         Patient Gender: F Patient Age:   15 years Exam Location:  [facility] Procedure:      VAS US CAROTID Referring Phys: Dewitt Hoes DE [place] TORRE --------------------------------------------------------------------------------  Indications:      CVA. Risk Factors:     Hypertension, Diabetes, coronary artery disease. History of                   TIA. Other Factors:    Recent TBI. Comparison Study: No prior studies. Performing Technologist: Jean Rosenthal RDMS, RVT  Examination Guidelines: A complete evaluation includes B-mode imaging, spectral Doppler, color Doppler, and power Doppler as needed of all accessible portions of each vessel. Bilateral testing is considered an integral part of a complete examination. Limited examinations for reoccurring indications may be performed as noted.  Right Carotid Findings: +----------+--------+--------+--------+------------------+------------------+  PSV cm/sEDV cm/sStenosisPlaque DescriptionComments           +----------+--------+--------+--------+------------------+------------------+ CCA Prox  64      11                                                   +----------+--------+--------+--------+------------------+------------------+ CCA Distal47      9                                 intimal thickening +----------+--------+--------+--------+------------------+------------------+ ICA Prox  41      9               heterogenous mild                    +----------+--------+--------+--------+------------------+------------------+ ICA Distal56      12                                                   +----------+--------+--------+--------+------------------+------------------+ ECA       52                                                           +----------+--------+--------+--------+------------------+------------------+  +----------+--------+-------+----------------+-------------------+           PSV cm/sEDV cmsDescribe        Arm Pressure (mmHG) +----------+--------+-------+----------------+-------------------+ [id number]             Multiphasic, WNL                    +----------+--------+-------+----------------+-------------------+ +---------+--------+--+--------+-+---------+ VertebralPSV cm/s42EDV cm/s9Antegrade +---------+--------+--+--------+-+---------+  Left Carotid Findings: +----------+--------+--------+--------+------------------+------------------+           PSV cm/sEDV cm/sStenosisPlaque DescriptionComments           +----------+--------+--------+--------+------------------+------------------+ CCA Prox  59      9                                                    +----------+--------+--------+--------+------------------+------------------+ CCA Distal51      11                                intimal thickening +----------+--------+--------+--------+------------------+------------------+ ICA Prox  29      7               heterogenous mild                    +----------+--------+--------+--------+------------------+------------------+ ICA Distal46      12                                                   +----------+--------+--------+--------+------------------+------------------+ ECA  40                                                           +----------+--------+--------+--------+------------------+------------------+ +----------+--------+--------+----------------+-------------------+           PSV cm/sEDV cm/sDescribe        Arm Pressure (mmHG) +----------+--------+--------+----------------+-------------------+ Subclavian130             Multiphasic, WNL                    +----------+--------+--------+----------------+-------------------+ +---------+--------+--+--------+-+---------+ VertebralPSV cm/s33EDV cm/s5Antegrade  +---------+--------+--+--------+-+---------+   Summary: Right Carotid: The extracranial vessels were near-normal with only minimal wall                thickening or plaque. Left Carotid: The extracranial vessels were near-normal with only minimal wall               thickening or plaque. Vertebrals:  Bilateral vertebral arteries demonstrate antegrade flow. Subclavians: Normal flow hemodynamics were seen in bilateral subclavian              arteries. *See table(s) above for measurements and observations.  Electronically signed by Delia Heady MD on 10/27/2022 at 12:24:42 PM.    Final         Scheduled Meds:  acetaminophen  500 mg Oral TID   amiodarone  200 mg Oral BID   Followed by   Melene Muller ON 11/04/2022] amiodarone  200 mg Oral Daily   aspirin  81 mg Oral Daily   Chlorhexidine Gluconate Cloth  6 each Topical Daily   Chlorhexidine Gluconate Cloth  6 each Topical Q0600   Chlorhexidine Gluconate Cloth  6 each Topical Q0600   insulin aspart  0-6 Units Subcutaneous TID WC   lipase/protease/amylase  36,000 Units Oral BID WC   metoprolol succinate  25 mg Oral Daily   prednisoLONE acetate  1 drop Both Eyes BID   rOPINIRole  0.5 mg Oral QHS   sevelamer carbonate  2,400 mg Oral TID WC   valACYclovir  500 mg Oral QHS   Continuous Infusions:  anticoagulant sodium citrate        LOS: 7 days    Time spent: 35 minutes    Aleem Elza A Josede Cicero, MD [facility]   If 7PM-7AM, please contact night-coverage [email or web address]  10/28/2022, 3:38 PM

## 2022-10-28 NOTE — Progress Notes (Signed)
Occupational Therapy Treatment Patient Details Name: Kim Burns MRN: [id number] DOB: 08-06-1935 Today's Date: 10/28/2022   History of present illness Pt is an 87 y.o. female who presented to [facility] ED on 10/21/22 after being found asleep on the floor by family with pt not remembering falling. Pt found to have right frontal hemorrhagic contusion, a nondisplaced left parietal and occipital fracture, left posterior scalp hematoma, and small nondisplaced S4 fracture. CT scan on 10/24/22 revealed Small new Left > right Subdural Hematomas have developed since 10/21/22. Recent admission in July 2024 for afib with RVR. Found to have left inferior pubic ramus fx felt to be minimally displaced and no interventions needed WBAT per ortho 10/26/22.  Also found to have a small acute ischemic stroke in posterior right frontal region with additional possible punctate infarct involving right anterior frontal lobe on MRI 9/7 felt cardioembolic in setting of a-fib not on anticoagulation. PMH - afib, ESRD on HD, chf, DM, HTN, Whipple.   OT comments  Pt progressing towards goals, overall limited by dizziness, but agreeable to attempt OOB mobility. Pt needing supervision-mod A for seated ADLs, mod A for bed mobility and mod A for transfers with RW. Pt with mild decr coordination in RUE >LUE, slowed processing needing increased verbal/tactile cues to follow commands during session. Pt presenting with impairments listed below, will follow acutely. Patient will benefit from intensive inpatient follow up therapy, >3 hours/day to maximize safety/ind with ADLs/functional mobility.       If plan is discharge home, recommend the following:  A little help with walking and/or transfers;A lot of help with bathing/dressing/bathroom;Assistance with cooking/housework;Direct supervision/assist for medications management;Direct supervision/assist for financial management;Assist for transportation;Help with stairs or ramp for entrance;Supervision  due to cognitive status   Equipment Recommendations  Other (comment) (defer)    Recommendations for Other Services PT consult;Rehab consult    Precautions / Restrictions Precautions Precautions: Fall Restrictions Weight Bearing Restrictions: Yes LLE Weight Bearing: Weight bearing as tolerated       Mobility Bed Mobility Overal bed mobility: Needs Assistance Bed Mobility: Sidelying to Sit   Sidelying to sit: Mod assist       General bed mobility comments: mod A for trunk elevation    Transfers Overall transfer level: Needs assistance Equipment used: Rolling walker (2 wheels) Transfers: Sit to/from Stand Sit to Stand: Mod assist           General transfer comment: from elevated bed height     Balance Overall balance assessment: Needs assistance Sitting-balance support: No upper extremity supported, Feet supported Sitting balance-Leahy Scale: Fair     Standing balance support: Bilateral upper extremity supported Standing balance-Leahy Scale: Poor                             ADL either performed or assessed with clinical judgement   ADL Overall ADL's : Needs assistance/impaired Eating/Feeding: Supervision/ safety   Grooming: Wash/dry face;Sitting   Upper Body Bathing: Moderate assistance;Sitting   Lower Body Bathing: Moderate assistance;Sitting/lateral leans   Upper Body Dressing : Moderate assistance;Sitting   Lower Body Dressing: Moderate assistance;Sitting/lateral leans   Toilet Transfer: Moderate assistance;Squat-pivot;Stand-pivot;BSC/3in1   Toileting- Clothing Manipulation and Hygiene: Moderate assistance       Functional mobility during ADLs: Moderate assistance;Rolling walker (2 wheels)      Extremity/Trunk Assessment Upper Extremity Assessment RUE Deficits / Details: generalized weakness, mild decr coordination noted in RUE compared to LUE RUE Sensation: history of peripheral  neuropathy RUE Coordination: decreased fine  motor LUE Sensation: history of peripheral neuropathy   Lower Extremity Assessment Lower Extremity Assessment: Defer to PT evaluation   Cervical / Trunk Assessment Cervical / Trunk Assessment: Normal    Vision Baseline Vision/History: 1 Wears glasses Ability to See in Adequate Light: 1 Impaired Additional Comments: pt reports following up with eye dr. states her vision symptoms appear when her shingles flares up   Perception Perception Perception: Not tested   Praxis Praxis Praxis: Not tested    Cognition Arousal: Alert Behavior During Therapy: [facility] for tasks assessed/performed Overall Cognitive Status: Impaired/Different from baseline Area of Impairment: Problem solving, Attention                   Current Attention Level: Selective         Problem Solving: Slow processing General Comments: A & O x4, some slowed processing needing increased cues to follow commands, aware she is in the hospital for a fall and also had a stroke        Exercises      Shoulder Instructions       General Comments VSS on RA    Pertinent Vitals/ Pain       Pain Assessment Pain Assessment: Faces Pain Score: 4  Faces Pain Scale: Hurts little more Pain Location: bil legs, restless legs Pain Descriptors / Indicators: Discomfort Pain Intervention(s): Limited activity within patient's tolerance, Monitored during session, Repositioned  Home Living Family/patient expects to be discharged to:: Private residence Living Arrangements: Children (daughter assists in "summertime" son assists when daughter is not present,son works from home) Available Help at Discharge: Family;Available 24 hours/day Type of Home: House Home Access: Stairs to enter [employer] of Steps: 4 to get in the house Entrance Stairs-Rails: Left;Right Home Layout: Able to live on main level with bedroom/bathroom;Laundry or work area in basement Alternate Teacher, music of Steps: ~13 Alternate  Level Stairs-Rails: Left Bathroom Shower/Tub: Producer, television/film/video: Handicapped height Bathroom Accessibility: Yes How Accessible: Accessible via walker Home Equipment: Agricultural consultant (2 wheels);Shower seat;Rollator (4 wheels);Wheelchair - manual;Cane - single point          Prior Functioning/Environment              Frequency  Min 1X/week        Progress Toward Goals  OT Goals(current goals can now be found in the care plan section)     Acute Rehab OT Goals Patient Stated Goal: none stated OT Goal Formulation: With patient Time For Goal Achievement: 11/07/22 Potential to Achieve Goals: Good  Plan      Co-evaluation                 AM-PAC OT "6 Clicks" Daily Activity     Outcome Measure   Help from another person eating meals?: A Little Help from another person taking care of personal grooming?: A Little Help from another person toileting, which includes using toliet, bedpan, or urinal?: A Lot Help from another person bathing (including washing, rinsing, drying)?: A Lot Help from another person to put on and taking off regular upper body clothing?: A Lot Help from another person to put on and taking off regular lower body clothing?: A Lot 6 Click Score: 14    End of Session Equipment Utilized During Treatment: Gait belt;Rolling walker (2 wheels)  OT Visit Diagnosis: History of falling (Z91.81);Muscle weakness (generalized) (M62.81)   Activity Tolerance Patient tolerated treatment well   Patient Left in  chair;with call bell/phone within reach;with chair alarm set   Nurse Communication Mobility status        Time: [phone] OT Time Calculation (min): 33 min  Charges: OT General Charges $OT Visit: 1 Visit OT Treatments $Self Care/Home Management : 23-37 mins  Carver Fila, OTD, OTR/L SecureChat Preferred Acute Rehab [phone]   Carver Fila Koonce 10/28/2022, 11:31 AM

## 2022-10-28 NOTE — Progress Notes (Signed)
Heart Failure Navigator Progress Note  Assessed for Heart & Vascular TOC clinic readiness.  Patient does not meet criteria due to ESRD on HD.   Megan Boothby, PharmD, BCPS Heart Failure Stewardship Pharmacist Phone [phone]    

## 2022-10-28 NOTE — Plan of Care (Signed)
  Problem: Education: Goal: Ability to describe self-care measures that may prevent or decrease complications (Diabetes Survival Skills Education) will improve Outcome: Progressing Goal: Individualized Educational Video(s) Outcome: Progressing   Problem: Coping: Goal: Ability to adjust to condition or change in health will improve Outcome: Progressing   Problem: Fluid Volume: Goal: Ability to maintain a balanced intake and output will improve Outcome: Progressing   Problem: Health Behavior/Discharge Planning: Goal: Ability to identify and utilize available resources and services will improve Outcome: Progressing Goal: Ability to manage health-related needs will improve Outcome: Progressing   Problem: Metabolic: Goal: Ability to maintain appropriate glucose levels will improve Outcome: Progressing   Problem: Nutritional: Goal: Maintenance of adequate nutrition will improve Outcome: Progressing Goal: Progress toward achieving an optimal weight will improve Outcome: Progressing   Problem: Skin Integrity: Goal: Risk for impaired skin integrity will decrease Outcome: Progressing   Problem: Tissue Perfusion: Goal: Adequacy of tissue perfusion will improve Outcome: Progressing   Problem: Education: Goal: Knowledge of General Education information will improve Description: Including pain rating scale, medication(s)/side effects and non-pharmacologic comfort measures Outcome: Progressing   Problem: Health Behavior/Discharge Planning: Goal: Ability to manage health-related needs will improve Outcome: Progressing   Problem: Clinical Measurements: Goal: Ability to maintain clinical measurements within normal limits will improve Outcome: Progressing Goal: Will remain free from infection Outcome: Progressing Goal: Diagnostic test results will improve Outcome: Progressing Goal: Respiratory complications will improve Outcome: Progressing Goal: Cardiovascular complication will  be avoided Outcome: Progressing   Problem: Activity: Goal: Risk for activity intolerance will decrease Outcome: Progressing   Problem: Nutrition: Goal: Adequate nutrition will be maintained Outcome: Progressing   Problem: Coping: Goal: Level of anxiety will decrease Outcome: Progressing   Problem: Elimination: Goal: Will not experience complications related to bowel motility Outcome: Progressing Goal: Will not experience complications related to urinary retention Outcome: Progressing   Problem: Pain Managment: Goal: General experience of comfort will improve Outcome: Progressing   Problem: Safety: Goal: Ability to remain free from injury will improve Outcome: Progressing   Problem: Skin Integrity: Goal: Risk for impaired skin integrity will decrease Outcome: Progressing   Problem: Education: Goal: Knowledge of disease or condition will improve Outcome: Progressing Goal: Understanding of medication regimen will improve Outcome: Progressing   Problem: Cardiac: Goal: Ability to achieve and maintain adequate cardiopulmonary perfusion will improve Outcome: Progressing   Problem: Health Behavior/Discharge Planning: Goal: Ability to safely manage health-related needs after discharge will improve Outcome: Progressing   Problem: Education: Goal: Knowledge of disease or condition will improve Outcome: Progressing Goal: Knowledge of secondary prevention will improve (MUST DOCUMENT ALL) Outcome: Progressing Goal: Knowledge of patient specific risk factors will improve [name] N/A or DELETE if not current risk factor) Outcome: Progressing   Problem: Ischemic Stroke/TIA Tissue Perfusion: Goal: Complications of ischemic stroke/TIA will be minimized Outcome: Progressing   Problem: Coping: Goal: Will verbalize positive feelings about self Outcome: Progressing Goal: Will identify appropriate support needs Outcome: Progressing   Problem: Health Behavior/Discharge  Planning: Goal: Ability to manage health-related needs will improve Outcome: Progressing Goal: Goals will be collaboratively established with patient/family Outcome: Progressing   Problem: Self-Care: Goal: Ability to participate in self-care as condition permits will improve Outcome: Progressing Goal: Verbalization of feelings and concerns over difficulty with self-care will improve Outcome: Progressing Goal: Ability to communicate needs accurately will improve Outcome: Progressing   Problem: Nutrition: Goal: Risk of aspiration will decrease Outcome: Progressing Goal: Dietary intake will improve Outcome: Progressing

## 2022-10-29 ENCOUNTER — Encounter: Payer: Self-pay | Admitting: Dermatology

## 2022-10-29 DIAGNOSIS — I4891 Unspecified atrial fibrillation: Secondary | ICD-10-CM | POA: Diagnosis not present

## 2022-10-29 DIAGNOSIS — I5021 Acute systolic (congestive) heart failure: Secondary | ICD-10-CM | POA: Diagnosis not present

## 2022-10-29 LAB — GLUCOSE, CAPILLARY
Glucose-Capillary: 123 mg/dL — ABNORMAL HIGH (ref 70–99)
Glucose-Capillary: 120 mg/dL — ABNORMAL HIGH (ref 70–99)

## 2022-10-29 MED ORDER — LIDOCAINE 5 % EX PTCH
1.0000 | MEDICATED_PATCH | CUTANEOUS | Status: DC
  Administered 2022-10-29 – 2022-10-30 (×2): 1 via TRANSDERMAL
  Filled 2022-10-29 (×2): qty 1

## 2022-10-29 NOTE — Progress Notes (Signed)
Physical Therapy Treatment Patient Details Name: Kim Burns MRN: [id number] DOB: 1935-10-27 Today's Date: 10/29/2022   History of Present Illness Pt is an 87 y.o. female who presented to [facility] ED on 10/21/22 after being found asleep on the floor by family with pt not remembering falling. Pt found to have right frontal hemorrhagic contusion, a nondisplaced left parietal and occipital fracture, left posterior scalp hematoma, and small nondisplaced S4 fracture. CT scan on 10/24/22 revealed Small new Left > right Subdural Hematomas have developed since 10/21/22. Recent admission in July 2024 for afib with RVR. Found to have left inferior pubic ramus fx felt to be minimally displaced and no interventions needed WBAT per ortho 10/26/22.  Also found to have a small acute ischemic stroke in posterior right frontal region with additional possible punctate infarct involving right anterior frontal lobe on MRI 9/7 felt cardioembolic in setting of a-fib not on anticoagulation. PMH - afib, ESRD on HD, chf, DM, HTN, Whipple.    PT Comments  Pt received in supine and agreeable to session. Pt continuing to report dizziness with position changes that limited mobility this session. Pt able to sit to EOB with min A and stand with CGA. Pt able to tolerate standing marches at EOB, demonstrating increased difficulty with LLE WB and mobility. Pt requires seated rest breaks due to fatigue. Pt continues to benefit from PT services to progress toward functional mobility goals.    If plan is discharge home, recommend the following: A lot of help with walking and/or transfers;A lot of help with bathing/dressing/bathroom;Assistance with cooking/housework;Direct supervision/assist for medications management;Assist for transportation;Help with stairs or ramp for entrance   Can travel by private vehicle        Equipment Recommendations  None recommended by PT    Recommendations for Other Services       Precautions / Restrictions  Precautions Precautions: Fall Restrictions Weight Bearing Restrictions: Yes LLE Weight Bearing: Weight bearing as tolerated Other Position/Activity Restrictions: use RW per ortho MD Reynolds     Mobility  Bed Mobility Overal bed mobility: Needs Assistance Bed Mobility: Supine to Sit     Supine to sit: Min assist     General bed mobility comments: Min A to elevate LLE for pt to advance and for trunk elevation    Transfers Overall transfer level: Needs assistance Equipment used: Rolling walker (2 wheels) Transfers: Sit to/from Stand Sit to Stand: Contact guard assist           General transfer comment: STS x2 from slightly elevated EOB with CGA for safety and increased time for power up    Ambulation/Gait             Pre-gait activities: marching at EOB General Gait Details: unable due to dizziness    Modified Rankin (Stroke Patients Only) Modified Rankin (Stroke Patients Only) Pre-Morbid Rankin Score: Moderately severe disability Modified Rankin: Moderately severe disability     Balance Overall balance assessment: Needs assistance Sitting-balance support: No upper extremity supported, Feet supported Sitting balance-Leahy Scale: Good Sitting balance - Comments: sitting EOB   Standing balance support: Bilateral upper extremity supported, Reliant on assistive device for balance, During functional activity Standing balance-Leahy Scale: Poor Standing balance comment: with RW support                            Cognition Arousal: Alert Behavior During Therapy: WFL for tasks assessed/performed Overall Cognitive Status: Impaired/Different from baseline  General Comments: some slow processing, but follows simple commands consistently        Exercises General Exercises - Lower Extremity Hip Flexion/Marching: AROM, Standing, Both, 10 reps (x2)    General Comments        Pertinent Vitals/Pain  Pain Assessment Pain Assessment: Faces Faces Pain Scale: Hurts even more Pain Location: LLE with mobility Pain Descriptors / Indicators: Discomfort, Guarding, Grimacing Pain Intervention(s): Limited activity within patient's tolerance, Monitored during session, Repositioned     PT Goals (current goals can now be found in the care plan section) Acute Rehab PT Goals Patient Stated Goal: improve pain, mobility PT Goal Formulation: With patient Time For Goal Achievement: 11/11/22 Progress towards PT goals: Progressing toward goals    Frequency    Min 1X/week       AM-PAC PT "6 Clicks" Mobility   Outcome Measure  Help needed turning from your back to your side while in a flat bed without using bedrails?: A Little Help needed moving from lying on your back to sitting on the side of a flat bed without using bedrails?: A Little Help needed moving to and from a bed to a chair (including a wheelchair)?: A Lot Help needed standing up from a chair using your arms (e.g., wheelchair or bedside chair)?: A Little Help needed to walk in hospital room?: Total Help needed climbing 3-5 steps with a railing? : Total 6 Click Score: 13    End of Session Equipment Utilized During Treatment: Gait belt Activity Tolerance: Other (comment) (limited by dizziness) Patient left: in bed;with call bell/phone within reach;with bed alarm set (sitting EOB) Nurse Communication: Mobility status (Pt sitting EOB eating lunch) PT Visit Diagnosis: Other abnormalities of gait and mobility (R26.89);Pain;Other symptoms and signs involving the nervous system (R29.898)     Time: 2440-1027 PT Time Calculation (min) (ACUTE ONLY): 23 min  Charges:    $Therapeutic Exercise: 8-22 mins $Therapeutic Activity: 8-22 mins PT General Charges $$ ACUTE PT VISIT: 1 Visit                     Johny Shock, PTA Acute Rehabilitation Services Secure Chat Preferred  [phone]    Johny Shock 10/29/2022,  1:23 PM

## 2022-10-29 NOTE — Progress Notes (Signed)
  [facility] Progress Note   Subjective:    Still dizzy but otherwise no c/o, fatigued For CIR next step it appears HD yesterday: 2L UF  Objective Vitals:   10/28/22 2358 10/29/22 0433 10/29/22 0501 10/29/22 0723  BP: 111/86 (!) 142/74  (!) 150/73  Pulse: 69 74  67  Resp:  15  14  Temp: 97.6 F (36.4 C) 98 F (36.7 C)  98 F (36.7 C)  TempSrc: Oral Oral  Oral  SpO2: 99% 100%  98%  Weight:   54.8 kg   Height:       Physical Exam General: Frail-appearing elderly female lying supine on hospital bed.  Heart:RRR, no M/R/G Lungs: clear bilat  Abdomen: soft, non-tender, non-distended.  Extremities: Trace pretib edema  Dialysis Access: LUA AVF +thrill   OP HD:  [facility] MWF   3.5h   55kg   350/1.5   2/2 bath  L AVF  Heparin none   Assessment/Plan:  # Frontal Contusion and ischemic CVA: Managed by neurosurgery/neurology. F/u CT was normal. She seems to be improved.   # ESRD - MWF HD. Cont MWF here. HD today on schedule . No heparin.   # Hypertension: bp's stable. On po metoprolol here.   # Volume - Stable, euvolemic on exam. UF 1-2 L w/ HD tomorrow.    # Anemia of CKD: Trend CBCs. Hb 10-13.  No issues   # CKD-BMD: cont binders, P 6.5,  Ca corrects.    [name], MD  10/29/2022, 9:02 AM  Recent Labs  Lab 10/23/22 0845 10/24/22 1536 10/26/22 0314 10/27/22 0550 10/28/22 0906  HGB 10.6* 13.0  --   --  10.9*  ALBUMIN  --   --   --   --  2.0*  CALCIUM 7.6* 8.2*   < > 7.8* 7.6*  PHOS 8.2*  --   --   --  6.5*  CREATININE 4.32* 3.59*   < > 3.59* 4.23*  K 3.8 3.8   < > 3.4* 3.5   < > = values in this interval not displayed.    Inpatient medications:  acetaminophen  500 mg Oral TID   amiodarone  200 mg Oral BID   Followed by   Melene Muller ON 11/04/2022] amiodarone  200 mg Oral Daily   aspirin  81 mg Oral Daily   Chlorhexidine Gluconate Cloth  6 each Topical Daily   Chlorhexidine Gluconate Cloth  6 each Topical Q0600   Chlorhexidine Gluconate Cloth  6  each Topical Q0600   insulin aspart  0-6 Units Subcutaneous TID WC   lipase/protease/amylase  36,000 Units Oral BID WC   metoprolol succinate  25 mg Oral Daily   prednisoLONE acetate  1 drop Both Eyes BID   rOPINIRole  0.5 mg Oral QHS   sevelamer carbonate  2,400 mg Oral TID WC   valACYclovir  500 mg Oral QHS      acetaminophen, cycloSPORINE, docusate sodium, hydrALAZINE, meclizine, olopatadine, ondansetron (ZOFRAN) IV, mouth rinse, polyethylene glycol, rOPINIRole

## 2022-10-29 NOTE — Plan of Care (Signed)
  Problem: Education: Goal: Ability to describe self-care measures that may prevent or decrease complications (Diabetes Survival Skills Education) will improve Outcome: Progressing Goal: Individualized Educational Video(s) Outcome: Progressing   Problem: Coping: Goal: Ability to adjust to condition or change in health will improve Outcome: Progressing   Problem: Health Behavior/Discharge Planning: Goal: Ability to identify and utilize available resources and services will improve Outcome: Progressing Goal: Ability to manage health-related needs will improve Outcome: Progressing   Problem: Metabolic: Goal: Ability to maintain appropriate glucose levels will improve Outcome: Progressing   Problem: Nutritional: Goal: Maintenance of adequate nutrition will improve Outcome: Progressing Goal: Progress toward achieving an optimal weight will improve Outcome: Progressing   Problem: Skin Integrity: Goal: Risk for impaired skin integrity will decrease Outcome: Progressing   Problem: Tissue Perfusion: Goal: Adequacy of tissue perfusion will improve Outcome: Progressing   Problem: Education: Goal: Knowledge of General Education information will improve Description: Including pain rating scale, medication(s)/side effects and non-pharmacologic comfort measures Outcome: Progressing   Problem: Health Behavior/Discharge Planning: Goal: Ability to manage health-related needs will improve Outcome: Progressing   Problem: Clinical Measurements: Goal: Ability to maintain clinical measurements within normal limits will improve Outcome: Progressing Goal: Will remain free from infection Outcome: Progressing Goal: Diagnostic test results will improve Outcome: Progressing Goal: Respiratory complications will improve Outcome: Progressing Goal: Cardiovascular complication will be avoided Outcome: Progressing   Problem: Activity: Goal: Risk for activity intolerance will decrease Outcome:  Progressing   Problem: Nutrition: Goal: Adequate nutrition will be maintained Outcome: Progressing   Problem: Coping: Goal: Level of anxiety will decrease Outcome: Progressing   Problem: Elimination: Goal: Will not experience complications related to bowel motility Outcome: Progressing Goal: Will not experience complications related to urinary retention Outcome: Progressing   Problem: Pain Managment: Goal: General experience of comfort will improve Outcome: Progressing   Problem: Safety: Goal: Ability to remain free from injury will improve Outcome: Progressing   Problem: Skin Integrity: Goal: Risk for impaired skin integrity will decrease Outcome: Progressing   Problem: Education: Goal: Knowledge of disease or condition will improve Outcome: Progressing Goal: Understanding of medication regimen will improve Outcome: Progressing   Problem: Cardiac: Goal: Ability to achieve and maintain adequate cardiopulmonary perfusion will improve Outcome: Progressing   Problem: Health Behavior/Discharge Planning: Goal: Ability to safely manage health-related needs after discharge will improve Outcome: Progressing   Problem: Education: Goal: Knowledge of disease or condition will improve Outcome: Progressing Goal: Knowledge of secondary prevention will improve (MUST DOCUMENT ALL) Outcome: Progressing Goal: Knowledge of patient specific risk factors will improve Kim Burns N/A or DELETE if not current risk factor) Outcome: Progressing   Problem: Ischemic Stroke/TIA Tissue Perfusion: Goal: Complications of ischemic stroke/TIA will be minimized Outcome: Progressing   Problem: Coping: Goal: Will verbalize positive feelings about self Outcome: Progressing Goal: Will identify appropriate support needs Outcome: Progressing   Problem: Health Behavior/Discharge Planning: Goal: Ability to manage health-related needs will improve Outcome: Progressing Goal: Goals will be  collaboratively established with patient/family Outcome: Progressing   Problem: Self-Care: Goal: Ability to participate in self-care as condition permits will improve Outcome: Progressing Goal: Verbalization of feelings and concerns over difficulty with self-care will improve Outcome: Progressing Goal: Ability to communicate needs accurately will improve Outcome: Progressing   Problem: Nutrition: Goal: Risk of aspiration will decrease Outcome: Progressing Goal: Dietary intake will improve Outcome: Progressing

## 2022-10-29 NOTE — Progress Notes (Signed)
Inpatient Rehab Admissions Coordinator:    CIR following. There was some confusion regarding insurance approval. I do not have insurance auth to admit, as I submitted case this AM and have not yet received a response. I will follow for potential admit pending insurance approval.  Megan Salon, MS, CCC-SLP Rehab Admissions Coordinator  [phone] (celll) [phone] (office)

## 2022-10-29 NOTE — Progress Notes (Signed)
Orthostatic vitals  Lying BP 178/78  Sitting  171/82  Standing 129/72

## 2022-10-29 NOTE — Progress Notes (Signed)
PROGRESS NOTE    Kim Burns  WUJ:[id number] DOB: Jun 06, 1935 DOA: 10/21/2022 PCP: Lewis Moccasin, MD   Brief Narrative: 87 year old past medical history significant for Whipple procedure, small bowel resection, due to nonmalignant pancreatic mass, RCC with left renal embolization and ablation with nephrostomy tube in place, hypertension, A-fib not on anticoagulation, ESRD on hemodialysis MWF, her son-in-law went to pick her up for hemodialysis on 9/2 and found her asleep on the floor.  She does not remember falling.  Evaluation in the ED patient was found to have a right frontal hemorrhagic contusion and nondisplaced left parietal and occipital fracture, left posterior scalp hematoma, small nondisplaced S4 fracture.  Patient admitted for traumatic brain injury after a fall with apparent loss of consciousness versus mechanical fall with intact consciousness.  Patient was evaluated by neurosurgery, admitted to the ICU,  systolic blood pressure recommended 90-160.  During course of hospitalization patient was found to have A-fib with RVR, required IV amiodarone, cardiology consulted.  Subsequently transition to oral amiodarone.  During workup  MRI was obtained which showed acute versus subacute ischemic nonhemorrhagic infarct.  Evaluated by neurology who recommended starting a baby aspirin.  Assessment & Plan:   Principal Problem:   Traumatic brain injury [facility]) Active Problems:   Contusion of right cerebral hemisphere [facility])   Closed skull fracture (HCC)   Sacral fracture, closed (HCC)   Exocrine pancreatic insufficiency   Hypertension   Acute systolic heart failure (HCC)   Atrial fibrillation, rapid (HCC)   Pressure injury of skin   Lacunar infarct, acute (HCC)  1-TBI, Right frontal contrecoup hemorrhagic cerebral contusion -Nondisplaced left parietal and left occipital skull fracture -Scalp hematoma -Skull fracture: -CT head is stable.  Per neurosurgery continue to hold aspirin  for 1 week, may start Lovenox for DVT prophylaxis in 72 hours from 9/3 -had some confusion during course hospitalization.  -Patient became more confuse 9/04. Repeated CT head 9/05 showed stable hemorrhagic contusion and two small hygroma. -report dizziness, and lower extremity weakness, will proceed with MRI brain instead of CT scan.  -Less confused. Report dizziness with movement of head, 9/06. Meclizine PRN/  -On tylenol for pain, family concern with tramadol causing Hallucination. Discontinue tramadol.  -Stable, still having dizziness. PRN meclizine. Check orthostatic vitals.  -Hopefully for CIR>   Acute/subacute ischemic nonhemorrhagic Infarct:  -MRI brain:  9/06. 7 mm focus of mild diffusion signal abnormality involving the subcortical posterior right frontal region, suspicious for a small acute to early subacute ischemic nonhemorrhagic infarct. Possible additional punctate infarct involving the anterior right frontal lobe as above. -ECHO: EF 45-50 % global hypokinesis. Per cardiology likely related to A fib. Out patient evaluation.  -neurology consulted Carotid doppler negative.  Started on baby aspirin by neurology   A fib RVR;  New acute systolic, Heart failure.  Develops A fib RVR, started on IV amiodarone on 9/04.  Appreciate cardiology evaluation.  Metoprolol resume 9/06 On oral amiodarone. Needs amiodarone 200 mg BID for 1 week then 200 mg daily.  No anticoagulation due to prior history of bleeding pre nephrostomy tube and now new hemorrhagic cerebral contusion.   -Hypertension/hypotension -She gets midodrine with hemodialysis, currently on hold  ESRD on hemodialysis -MWF Appreciate nephrology assistance   RCC s/p embolization/ablation, status post left nephrostomy tube Status post nephrostomy tube exchange on Status post left nephrostomy tube exchange 9/3   S/ post Whipple 2018 Exocrine pancreatic insufficiency  IDDM:  -SSI  Frequent fall.  Left inferior pubic  ramus minimally displaced  fracture and probably displaced fracture of the left superior pubic ramus Subtle nondisplaced fracture of the anterior cortex of the sacrum at S4. Pain management.  PT/OT recommend CIR Report hip pain: x ray: Irregularity of the left inferior pubic ramus may represent a nondisplaced fracture in the appropriate clinical setting.  Mild left hip osteoarthritis MRI; acute , sub acute stroke.   ECHO> reduce EF 45 % CT: Minimally displaced fracture of the left inferior pubic ramus and probable nondisplaced fracture of the left superior pubic ramus. Evaluated by Dr. Emmaline Life, weightbearing as tolerated.  Outpatient follow-up in 3 weeks  Restless leg Syndrome:  Resume home dose requip.   Estimated body mass index is 21.4 kg/m as calculated from the following:   Height as of this encounter: 5\' 3"  (1.6 m).   Weight as of this encounter: 54.8 kg.   DVT prophylaxis: SCD Code Status: Full code Family Communication: Son  9/09 Disposition Plan:  Status is: Inpatient Remains inpatient appropriate because: Awaiting insurance authorization for CIR    Consultants:  Neurosurgery Nephrology   Procedures:  none  Antimicrobials:    Subjective: She is willing to work with a therapist, she reports some dizziness when she moves her head.  She report pelvic pain.  She has been eating more  Objective: Vitals:   10/29/22 0433 10/29/22 0501 10/29/22 0723 10/29/22 1118  BP: (!) 142/74  (!) 150/73 (!) 153/83  Pulse: 74  67 (!) 55  Resp: 15  14 11   Temp: 98 F (36.7 C)  98 F (36.7 C) (!) 97.4 F (36.3 C)  TempSrc: Oral  Oral Oral  SpO2: 100%  98% 98%  Weight:  54.8 kg    Height:        Intake/Output Summary (Last 24 hours) at 10/29/2022 1609 Last data filed at 10/29/2022 0454 Gross per 24 hour  Intake --  Output 2075 ml  Net -2075 ml   Filed Weights   10/28/22 1326 10/28/22 1730 10/29/22 0501  Weight: 55.2 kg 53.4 kg 54.8 kg    Examination:  General  exam: NAD Respiratory system: CTA Cardiovascular system: S 1 , S 2 RRR Gastrointestinal system: BS present, soft, nt Central nervous system: Alert, follows command Extremities: No edema  Data Reviewed: I have personally reviewed following labs and imaging studies  CBC: Recent Labs  Lab 10/23/22 0845 10/24/22 1536 10/28/22 0906  WBC 8.7 7.7 8.8  HGB 10.6* 13.0 10.9*  HCT 32.8* 40.4 33.4*  MCV 94.8 96.4 94.1  PLT 179 195 214   Basic Metabolic Panel: Recent Labs  Lab 10/23/22 0603 10/23/22 0845 10/24/22 1536 10/26/22 0314 10/27/22 0550 10/28/22 0906  NA 133* 132* 130* 131* 132* 130*  K 5.1 3.8 3.8 3.4* 3.4* 3.5  CL 95* 95* 90* 91* 91* 93*  CO2 19* 23 23 24 25 23   GLUCOSE 114* 111* 145* 136* 123* 149*  BUN 45* 46* 32* 19 30* 38*  CREATININE 4.34* 4.32* 3.59* 2.72* 3.59* 4.23*  CALCIUM 8.1* 7.6* 8.2* 7.8* 7.8* 7.6*  PHOS 8.6* 8.2*  --   --   --  6.5*   GFR: Estimated Creatinine Clearance: 8 mL/min (A) (by C-G formula based on SCr of 4.23 mg/dL (H)). Liver Function Tests: Recent Labs  Lab 10/28/22 0906  ALBUMIN 2.0*   No results for input(s): "LIPASE", "AMYLASE" in the last 168 hours. No results for input(s): "AMMONIA" in the last 168 hours. Coagulation Profile: No results for input(s): "INR", "PROTIME" in the last 168 hours. Cardiac Enzymes:  No results for input(s): "CKTOTAL", "CKMB", "CKMBINDEX", "TROPONINI" in the last 168 hours. BNP (last 3 results) No results for input(s): "PROBNP" in the last 8760 hours. HbA1C: Recent Labs    10-28-2022 1045  HGBA1C 6.1*   CBG: Recent Labs  Lab 10/28/22 1157 10/28/22 1905 10/28/22 2047 10/29/22 0531 10/29/22 1121  GLUCAP 111* 89 126* 123* 120*   Lipid Profile: Recent Labs    28-Oct-2022 0550  CHOL 104  HDL 49  LDLCALC 38  TRIG 84  CHOLHDL 2.1   Thyroid Function Tests: No results for input(s): "TSH", "T4TOTAL", "FREET4", "T3FREE", "THYROIDAB" in the last 72 hours. Anemia Panel: No results for input(s):  "VITAMINB12", "FOLATE", "FERRITIN", "TIBC", "IRON", "RETICCTPCT" in the last 72 hours. Sepsis Labs: No results for input(s): "PROCALCITON", "LATICACIDVEN" in the last 168 hours.  Recent Results (from the past 240 hour(s))  MRSA Next Gen by PCR, Nasal     Status: None   Collection Time: 10/21/22 12:32 PM   Specimen: Nasal Mucosa; Nasal Swab  Result Value Ref Range Status   MRSA by PCR Next Gen NOT DETECTED NOT DETECTED Final    Comment: (NOTE) The GeneXpert MRSA Assay (FDA approved for NASAL specimens only), is one component of a comprehensive MRSA colonization surveillance program. It is not intended to diagnose MRSA infection nor to guide or monitor treatment for MRSA infections. Test performance is not FDA approved in patients less than 9 years old. Performed at [facility], [address], [place], [place] [address]          Radiology Studies: EEG adult  Result Date: Oct 28, 2022 Charlsie Quest, MD     2022/10/28  4:56 PM Patient Name: Kim Burns MRN: [id number] Epilepsy Attending: Charlsie Quest Referring Physician/Provider: Tomie China, MD Date: 10/28/2022 Duration: 23.23 mins Patient history: 87yo F with ams getting eeg to evaluate for seizure Level of alertness: Awake, asleep AEDs during EEG study: None Technical aspects: This EEG study was done with scalp electrodes positioned according to the 10-20 International system of electrode placement. Electrical activity was reviewed with band pass filter of 1-70Hz , sensitivity of 7 uV/mm, display speed of 69mm/sec with a 60Hz  notched filter applied as appropriate. EEG data were recorded continuously and digitally stored.  Video monitoring was available and reviewed as appropriate. Description: The posterior dominant rhythm consists of 8-9 Hz activity of moderate voltage (25-35 uV) seen predominantly in posterior head regions, symmetric and reactive to eye opening and eye closing.Sleep was characterized by vertex waves,  sleep spindles (12 to 14 Hz), maximal frontocentral region. EEG showed intermittent generalized 3 to 6 Hz theta-delta slowing. Physiologic photic driving was not seen during photic stimulation.  Hyperventilation was not performed.   ABNORMALITY - Intermittent slow, generalized IMPRESSION: This study is suggestive of mild diffuse encephalopathy, nonspecific etiology.No seizures or epileptiform discharges were seen throughout the recording. Kim Burns        Scheduled Meds:  acetaminophen  500 mg Oral TID   amiodarone  200 mg Oral BID   Followed by   Melene Muller ON 11/04/2022] amiodarone  200 mg Oral Daily   aspirin  81 mg Oral Daily   Chlorhexidine Gluconate Cloth  6 each Topical Daily   Chlorhexidine Gluconate Cloth  6 each Topical Q0600   Chlorhexidine Gluconate Cloth  6 each Topical Q0600   insulin aspart  0-6 Units Subcutaneous TID WC   lidocaine  1 patch Transdermal Q24H   lipase/protease/amylase  36,000 Units Oral BID WC  metoprolol succinate  25 mg Oral Daily   prednisoLONE acetate  1 drop Both Eyes BID   rOPINIRole  0.5 mg Oral QHS   sevelamer carbonate  2,400 mg Oral TID WC   valACYclovir  500 mg Oral QHS   Continuous Infusions:      LOS: 8 days    Time spent: 35 minutes    Lathon Adan A Cyndra Feinberg, MD [facility]   If 7PM-7AM, please contact night-coverage [email or web address]  10/29/2022, 4:09 PM

## 2022-10-30 ENCOUNTER — Other Ambulatory Visit: Payer: Self-pay

## 2022-10-30 ENCOUNTER — Encounter (HOSPITAL_COMMUNITY): Payer: Self-pay | Admitting: Physical Medicine & Rehabilitation

## 2022-10-30 ENCOUNTER — Inpatient Hospital Stay (HOSPITAL_COMMUNITY)
Admission: EM | Admit: 2022-10-30 | Discharge: 2022-11-16 | DRG: 559 | Disposition: A | Discharge status: Home Health | Payer: Medicare PPO | Source: Intra-hospital | Attending: Physical Medicine & Rehabilitation | Admitting: Physical Medicine & Rehabilitation

## 2022-10-30 DIAGNOSIS — S069XAD Unspecified intracranial injury with loss of consciousness status unknown, subsequent encounter: Secondary | ICD-10-CM | POA: Diagnosis not present

## 2022-10-30 DIAGNOSIS — N186 End stage renal disease: Secondary | ICD-10-CM | POA: Diagnosis present

## 2022-10-30 DIAGNOSIS — Z90411 Acquired partial absence of pancreas: Secondary | ICD-10-CM

## 2022-10-30 DIAGNOSIS — Z820 Family history of epilepsy and other diseases of the nervous system: Secondary | ICD-10-CM | POA: Diagnosis not present

## 2022-10-30 DIAGNOSIS — S062XAD Diffuse traumatic brain injury with loss of consciousness status unknown, subsequent encounter: Secondary | ICD-10-CM

## 2022-10-30 DIAGNOSIS — G2581 Restless legs syndrome: Secondary | ICD-10-CM | POA: Diagnosis not present

## 2022-10-30 DIAGNOSIS — S32402D Unspecified fracture of left acetabulum, subsequent encounter for fracture with routine healing: Secondary | ICD-10-CM | POA: Diagnosis not present

## 2022-10-30 DIAGNOSIS — I10 Essential (primary) hypertension: Secondary | ICD-10-CM | POA: Diagnosis not present

## 2022-10-30 DIAGNOSIS — S069X9A Unspecified intracranial injury with loss of consciousness of unspecified duration, initial encounter: Secondary | ICD-10-CM | POA: Diagnosis not present

## 2022-10-30 DIAGNOSIS — Z9049 Acquired absence of other specified parts of digestive tract: Secondary | ICD-10-CM

## 2022-10-30 DIAGNOSIS — E876 Hypokalemia: Secondary | ICD-10-CM | POA: Diagnosis present

## 2022-10-30 DIAGNOSIS — N250 Renal osteodystrophy: Secondary | ICD-10-CM | POA: Diagnosis not present

## 2022-10-30 DIAGNOSIS — N2581 Secondary hyperparathyroidism of renal origin: Secondary | ICD-10-CM | POA: Diagnosis present

## 2022-10-30 DIAGNOSIS — S329XXD Fracture of unspecified parts of lumbosacral spine and pelvis, subsequent encounter for fracture with routine healing: Secondary | ICD-10-CM | POA: Diagnosis not present

## 2022-10-30 DIAGNOSIS — Z8249 Family history of ischemic heart disease and other diseases of the circulatory system: Secondary | ICD-10-CM | POA: Diagnosis not present

## 2022-10-30 DIAGNOSIS — E1142 Type 2 diabetes mellitus with diabetic polyneuropathy: Secondary | ICD-10-CM | POA: Diagnosis present

## 2022-10-30 DIAGNOSIS — S069X9S Unspecified intracranial injury with loss of consciousness of unspecified duration, sequela: Secondary | ICD-10-CM | POA: Diagnosis not present

## 2022-10-30 DIAGNOSIS — Z881 Allergy status to other antibiotic agents status: Secondary | ICD-10-CM

## 2022-10-30 DIAGNOSIS — I4891 Unspecified atrial fibrillation: Secondary | ICD-10-CM | POA: Diagnosis present

## 2022-10-30 DIAGNOSIS — I480 Paroxysmal atrial fibrillation: Secondary | ICD-10-CM | POA: Diagnosis present

## 2022-10-30 DIAGNOSIS — Z823 Family history of stroke: Secondary | ICD-10-CM

## 2022-10-30 DIAGNOSIS — S06330A Contusion and laceration of cerebrum, unspecified, without loss of consciousness, initial encounter: Secondary | ICD-10-CM | POA: Diagnosis not present

## 2022-10-30 DIAGNOSIS — F5104 Psychophysiologic insomnia: Secondary | ICD-10-CM | POA: Diagnosis present

## 2022-10-30 DIAGNOSIS — Z794 Long term (current) use of insulin: Secondary | ICD-10-CM | POA: Diagnosis not present

## 2022-10-30 DIAGNOSIS — W06XXXD Fall from bed, subsequent encounter: Secondary | ICD-10-CM | POA: Diagnosis present

## 2022-10-30 DIAGNOSIS — M160 Bilateral primary osteoarthritis of hip: Secondary | ICD-10-CM | POA: Diagnosis not present

## 2022-10-30 DIAGNOSIS — Z90710 Acquired absence of both cervix and uterus: Secondary | ICD-10-CM

## 2022-10-30 DIAGNOSIS — S0282XD Fracture of other specified skull and facial bones, left side, subsequent encounter for fracture with routine healing: Secondary | ICD-10-CM | POA: Diagnosis not present

## 2022-10-30 DIAGNOSIS — S3219XD Other fracture of sacrum, subsequent encounter for fracture with routine healing: Secondary | ICD-10-CM

## 2022-10-30 DIAGNOSIS — M4302 Spondylolysis, cervical region: Secondary | ICD-10-CM | POA: Diagnosis present

## 2022-10-30 DIAGNOSIS — S069X9D Unspecified intracranial injury with loss of consciousness of unspecified duration, subsequent encounter: Secondary | ICD-10-CM | POA: Diagnosis not present

## 2022-10-30 DIAGNOSIS — E1122 Type 2 diabetes mellitus with diabetic chronic kidney disease: Secondary | ICD-10-CM | POA: Diagnosis not present

## 2022-10-30 DIAGNOSIS — S069X0D Unspecified intracranial injury without loss of consciousness, subsequent encounter: Secondary | ICD-10-CM | POA: Diagnosis not present

## 2022-10-30 DIAGNOSIS — Z862 Personal history of diseases of the blood and blood-forming organs and certain disorders involving the immune mechanism: Secondary | ICD-10-CM

## 2022-10-30 DIAGNOSIS — S069XAA Unspecified intracranial injury with loss of consciousness status unknown, initial encounter: Principal | ICD-10-CM | POA: Diagnosis present

## 2022-10-30 DIAGNOSIS — D631 Anemia in chronic kidney disease: Secondary | ICD-10-CM | POA: Diagnosis present

## 2022-10-30 DIAGNOSIS — L89316 Pressure-induced deep tissue damage of right buttock: Secondary | ICD-10-CM | POA: Diagnosis present

## 2022-10-30 DIAGNOSIS — S02119D Unspecified fracture of occiput, subsequent encounter for fracture with routine healing: Secondary | ICD-10-CM

## 2022-10-30 DIAGNOSIS — I120 Hypertensive chronic kidney disease with stage 5 chronic kidney disease or end stage renal disease: Secondary | ICD-10-CM | POA: Diagnosis not present

## 2022-10-30 DIAGNOSIS — I6381 Other cerebral infarction due to occlusion or stenosis of small artery: Secondary | ICD-10-CM

## 2022-10-30 DIAGNOSIS — Z8619 Personal history of other infectious and parasitic diseases: Secondary | ICD-10-CM | POA: Diagnosis not present

## 2022-10-30 DIAGNOSIS — E119 Type 2 diabetes mellitus without complications: Secondary | ICD-10-CM

## 2022-10-30 DIAGNOSIS — R42 Dizziness and giddiness: Secondary | ICD-10-CM

## 2022-10-30 DIAGNOSIS — S069X1D Unspecified intracranial injury with loss of consciousness of 30 minutes or less, subsequent encounter: Secondary | ICD-10-CM

## 2022-10-30 DIAGNOSIS — S3210XA Unspecified fracture of sacrum, initial encounter for closed fracture: Secondary | ICD-10-CM | POA: Diagnosis present

## 2022-10-30 DIAGNOSIS — S32592D Other specified fracture of left pubis, subsequent encounter for fracture with routine healing: Secondary | ICD-10-CM | POA: Diagnosis not present

## 2022-10-30 DIAGNOSIS — W19XXXA Unspecified fall, initial encounter: Secondary | ICD-10-CM | POA: Diagnosis not present

## 2022-10-30 DIAGNOSIS — Z992 Dependence on renal dialysis: Secondary | ICD-10-CM | POA: Diagnosis not present

## 2022-10-30 DIAGNOSIS — M5136 Other intervertebral disc degeneration, lumbar region: Secondary | ICD-10-CM | POA: Diagnosis present

## 2022-10-30 DIAGNOSIS — R5381 Other malaise: Secondary | ICD-10-CM | POA: Diagnosis present

## 2022-10-30 DIAGNOSIS — H8110 Benign paroxysmal vertigo, unspecified ear: Secondary | ICD-10-CM | POA: Diagnosis not present

## 2022-10-30 DIAGNOSIS — I6310 Cerebral infarction due to embolism of unspecified precerebral artery: Secondary | ICD-10-CM

## 2022-10-30 LAB — RENAL FUNCTION PANEL
Albumin: 2.1 g/dL — ABNORMAL LOW (ref 3.5–5.0)
Anion gap: 11 (ref 5–15)
BUN: 16 mg/dL (ref 8–23)
CO2: 28 mmol/L (ref 22–32)
Calcium: 8.1 mg/dL — ABNORMAL LOW (ref 8.9–10.3)
Chloride: 94 mmol/L — ABNORMAL LOW (ref 98–111)
Creatinine, Ser: 2.53 mg/dL — ABNORMAL HIGH (ref 0.44–1.00)
GFR, Estimated: 18 mL/min — ABNORMAL LOW (ref >60)
Glucose, Bld: 119 mg/dL — ABNORMAL HIGH (ref 70–99)
Phosphorus: 3.2 mg/dL (ref 2.5–4.6)
Potassium: 3.2 mmol/L — ABNORMAL LOW (ref 3.5–5.1)
Sodium: 133 mmol/L — ABNORMAL LOW (ref 135–145)

## 2022-10-30 LAB — CBC
HCT: 32.3 % — ABNORMAL LOW (ref 36.0–46.0)
HCT: 35.1 % — ABNORMAL LOW (ref 36.0–46.0)
Hemoglobin: 10.5 g/dL — ABNORMAL LOW (ref 12.0–15.0)
Hemoglobin: 11.7 g/dL — ABNORMAL LOW (ref 12.0–15.0)
MCH: 31.1 pg (ref 26.0–34.0)
MCH: 32.1 pg (ref 26.0–34.0)
MCHC: 32.5 g/dL (ref 30.0–36.0)
MCHC: 33.3 g/dL (ref 30.0–36.0)
MCV: 95.6 fL (ref 80.0–100.0)
MCV: 96.4 fL (ref 80.0–100.0)
Platelets: 242 10*3/uL (ref 150–400)
Platelets: 248 10*3/uL (ref 150–400)
RBC: 3.38 MIL/uL — ABNORMAL LOW (ref 3.87–5.11)
RBC: 3.64 MIL/uL — ABNORMAL LOW (ref 3.87–5.11)
RDW: 17.3 % — ABNORMAL HIGH (ref 11.5–15.5)
RDW: 17.2 % — ABNORMAL HIGH (ref 11.5–15.5)
WBC: 7.7 10*3/uL (ref 4.0–10.5)
WBC: 7.6 10*3/uL (ref 4.0–10.5)
nRBC: 0 % (ref 0.0–0.2)
nRBC: 0 % (ref 0.0–0.2)

## 2022-10-30 LAB — BASIC METABOLIC PANEL
Anion gap: 13 (ref 5–15)
BUN: 31 mg/dL — ABNORMAL HIGH (ref 8–23)
CO2: 23 mmol/L (ref 22–32)
Calcium: 7.8 mg/dL — ABNORMAL LOW (ref 8.9–10.3)
Chloride: 94 mmol/L — ABNORMAL LOW (ref 98–111)
Creatinine, Ser: 3.82 mg/dL — ABNORMAL HIGH (ref 0.44–1.00)
GFR, Estimated: 11 mL/min — ABNORMAL LOW (ref >60)
Glucose, Bld: 172 mg/dL — ABNORMAL HIGH (ref 70–99)
Potassium: 3.5 mmol/L (ref 3.5–5.1)
Sodium: 130 mmol/L — ABNORMAL LOW (ref 135–145)

## 2022-10-30 LAB — GLUCOSE, CAPILLARY
Glucose-Capillary: 116 mg/dL — ABNORMAL HIGH (ref 70–99)
Glucose-Capillary: 239 mg/dL — ABNORMAL HIGH (ref 70–99)
Glucose-Capillary: 73 mg/dL (ref 70–99)
Glucose-Capillary: 80 mg/dL (ref 70–99)
Glucose-Capillary: 178 mg/dL — ABNORMAL HIGH (ref 70–99)

## 2022-10-30 LAB — MAGNESIUM: Magnesium: 1.7 mg/dL (ref 1.7–2.4)

## 2022-10-30 MED ORDER — IPRATROPIUM-ALBUTEROL 0.5-2.5 (3) MG/3ML IN SOLN: 3.0000 mL | RESPIRATORY_TRACT | Status: DC | PRN

## 2022-10-30 MED ORDER — ASPIRIN 81 MG PO CHEW: 81.0000 mg | CHEWABLE_TABLET | Freq: Every day | ORAL | Status: DC

## 2022-10-30 MED ORDER — NEPRO/CARBSTEADY PO LIQD: 237.0000 mL | ORAL | Status: DC | PRN

## 2022-10-30 MED ORDER — SENNOSIDES-DOCUSATE SODIUM 8.6-50 MG PO TABS: 1.0000 | ORAL_TABLET | Freq: Every evening | ORAL | Status: DC | PRN

## 2022-10-30 MED ORDER — GUAIFENESIN 100 MG/5ML PO LIQD: 5.0000 mL | ORAL | Status: DC | PRN

## 2022-10-30 MED ORDER — PENTAFLUOROPROP-TETRAFLUOROETH EX AERO: 1.0000 | INHALATION_SPRAY | CUTANEOUS | Status: DC | PRN

## 2022-10-30 MED ORDER — ALTEPLASE 2 MG IJ SOLR: 2.0000 mg | Freq: Once | INTRAMUSCULAR | Status: DC | PRN

## 2022-10-30 MED ORDER — LIDOCAINE-PRILOCAINE 2.5-2.5 % EX CREA: 1.0000 | TOPICAL_CREAM | CUTANEOUS | Status: DC | PRN

## 2022-10-30 MED ORDER — AMIODARONE HCL 200 MG PO TABS: ORAL_TABLET | ORAL | Status: DC

## 2022-10-30 MED ORDER — METOPROLOL SUCCINATE ER 25 MG PO TB24: 25.0000 mg | ORAL_TABLET | Freq: Every day | ORAL | Status: DC

## 2022-10-30 MED ORDER — DOCUSATE SODIUM 100 MG PO CAPS: 100.0000 mg | ORAL_CAPSULE | Freq: Two times a day (BID) | ORAL | Status: DC | PRN

## 2022-10-30 MED ORDER — LIDOCAINE HCL (PF) 1 % IJ SOLN: 5.0000 mL | INTRAMUSCULAR | Status: DC | PRN

## 2022-10-30 MED ORDER — ANTICOAGULANT SODIUM CITRATE 4% (200MG/5ML) IV SOLN: 5.0000 mL | Status: DC | PRN

## 2022-10-30 MED ORDER — HYDRALAZINE HCL 20 MG/ML IJ SOLN: 10.0000 mg | INTRAMUSCULAR | Status: DC | PRN

## 2022-10-30 MED ORDER — HEPARIN SODIUM (PORCINE) 1000 UNIT/ML DIALYSIS: 1000.0000 [IU] | INTRAMUSCULAR | Status: DC | PRN

## 2022-10-30 MED ORDER — METOPROLOL TARTRATE 5 MG/5ML IV SOLN: 5.0000 mg | INTRAVENOUS | Status: DC | PRN

## 2022-10-30 MED ORDER — MECLIZINE HCL 12.5 MG PO TABS: 12.5000 mg | ORAL_TABLET | Freq: Three times a day (TID) | ORAL | Status: DC | PRN

## 2022-10-30 NOTE — Progress Notes (Signed)
Inpatient Rehab Admissions Coordinator:    I have a CIR bed for this Pt. RN may call report to [phone].   Pt. To d/c to CIR for an estimated 7-10 days with the goal of discharging home with support of family.   Megan Salon, MS, CCC-SLP Rehab Admissions Coordinator  [phone] (celll) [phone] (office)

## 2022-10-30 NOTE — Progress Notes (Addendum)
PMR Admission Coordinator Pre-Admission Assessment   Patient: Kim Burns is an 87 y.o., female MRN: [id number] DOB: 1936/02/15 Height: 5\' 3"  (160 cm) Weight: 52.6 kg                                                                                                                                                  Insurance Information HMO:     PPO:    yes  PCP:      IPA:      80/20:      OTHER:  PRIMARY: [employer]      Policy#: [id number]  Medicare: [id number]    Subscriber: patient CM Name: Danella Penton      Phone#:    [phone]  Fax#: [phone]  Baird Lyons copeland approved admit 9/11 for 0/25-4/27 Pre-Cert#: [id number]       Employer:  Benefits:  Phone #:      Name:  Dolores Hoose Date: 02/18/2022- still active  Deductible: does not have  OOP Max: $3,300 ($2,865.41 met)  CIR: $125/day co-pay with a max co-pay of $1,250/admission (10 days) SNF: $0/day co-pay for days 1-20, $50/day co-pay for days 21-100, limited to 100 days/cal yr   Outpatient:  $20/visit co-pay  Home Health:  100% coverage  DME: 80% coverage; 20% co-insurance  Providers: in-network  SECONDARY:       Policy#:       Phone#:        Artist:       Phone#:    The Data processing manager" for patients in Inpatient Rehabilitation Facilities with attached "Privacy Act Statement-Health Care Records" was provided and verbally reviewed with: Patient   Emergency Contact Information Contact Information       Name Relation Home Work Pray Son [phone]   [phone]    [facility] Daughter [phone]   [phone]    Colley,Danielle Granddaughter [phone]   [phone]    Zamyriah, Polley [phone]   [phone]    Reamy,Ted Son     [phone]         Other Contacts   None on File      Current Medical History  Patient Admitting Diagnosis: TBI, CVA History of Present Illness: Pt is an 87 year old female with medical hx  significant for: Whipple procedure, 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  Pt presented to [facility] on 10/21/22 d/t being found down by family at home after apparent LOC. Imaging showed right frontal hemorrhagic contusion, a nondisplaced left parietal and occipital fracture, left posterior scalp hematoma, small nondisplaced S4 fracture. Neurosurgery consulted. Repeat CT showed stable right frontal contusion/hematoma. Cardiology consulted on 9/4 d/t A-fib with RVR. Started pt on amiodarone drip. Pt converted  to sinus rhythm on 9/5, back in A-fib on 9/6, converted to sinus rhythm on 9/7. Due increased confusion on 9/4,  a repeat CT on 9/5 showed stable hemorrhagic contusion and two small hygromas. Neurosurgery consulted and did not recommend surgical intervention. MRI on 9/6 was suspicious for small acute to early subacute ischemic nonhemorrhagic infarct. Possible additional punctate infarct involving anterior right frontal lobe. Neurology consulted and recommended full stroke workup. MRA was normal. 2D Echo found EF 45-50%Orthopedics consulted on 9/7 d/t pt c/o left hip pain. Imaging showed possible left inferior pubic rami fracture as well as small distal sacral fracture. No surgical intervention warranted and pt allowed to weightbear as tolerated. Pt. Seen by PT/OT/SLP and they recommend CIR to assist return to PLOF.  Complete NIHSS TOTAL: 1 Glasgow Coma Scale Score: 15   Patient's medical record from [facility] has been reviewed by the rehabilitation admission coordinator and physician.   Past Medical History      Past Medical History:  Diagnosis Date   Anemia of chronic disease      takes iron   Anxiety     Blood transfusion without reported diagnosis     Bradycardia     Diabetes mellitus without complication (HCC)      became diabetic after Whipple  procedure   Diarrhea     Dysrhythmia 04/16/2016    bradycardia due to medication    ESRD on hemodialysis [facility])      M-W-F   GERD (gastroesophageal reflux disease)     Gout     Headache     History of kidney stones 06/2013   Hyperparathyroidism (HCC)     Hypertension     PONV (postoperative nausea and vomiting)     Sleep apnea      no cpap machine. could not tolerate   Stroke (HCC) 04/16/2016    TIA 1995   Vitamin D deficiency            Has the patient had major surgery during 100 days prior to admission? No   Family History  family history includes Alzheimer's disease in her sister and sister; Cancer in her brother and father; Heart disease in her mother; Stroke in her mother.     Current Medications   Current Medications    Current Facility-Administered Medications:    acetaminophen (TYLENOL) tablet 500 mg, 500 mg, Oral, TID, Regalado, Belkys A, MD, 500 mg at 10/27/22 1527   acetaminophen (TYLENOL) tablet 650 mg, 650 mg, Oral, Q6H PRN, Bowser, Kaylyn Layer, NP, 650 mg at 10/26/22 0242   amiodarone (PACERONE) tablet 200 mg, 200 mg, Oral, BID **FOLLOWED BY** [START ON 11/04/2022] amiodarone (PACERONE) tablet 200 mg, 200 mg, Oral, Daily, Little Ishikawa, MD   aspirin chewable tablet 81 mg, 81 mg, Oral, Daily, Gevena Mart A, NP, 81 mg at 10/27/22 1527   Chlorhexidine Gluconate Cloth 2 % PADS 6 each, 6 each, Topical, Daily, Byrum, Les Pou, MD, 6 each at 10/27/22 1610   Chlorhexidine Gluconate Cloth 2 % PADS 6 each, 6 each, Topical, Q0600, Delano Metz, MD, 6 each at 10/27/22 0628   [START ON 10/28/2022] Chlorhexidine Gluconate Cloth 2 % PADS 6 each, 6 each, Topical, Q0600, Delano Metz, MD   cycloSPORINE (RESTASIS) 0.05 % ophthalmic emulsion 1 drop, 1 drop, Both Eyes, Daily PRN, Bowser, Kaylyn Layer, NP   docusate sodium (COLACE) capsule 100 mg, 100 mg, Oral, BID PRN, Bowser, Grace E, NP   hydrALAZINE (APRESOLINE) injection 10 mg, 10 mg,  Intravenous, Q4H PRN, Bowser, Kaylyn Layer, NP, 10 mg at 10/27/22 1222   insulin aspart (novoLOG) injection 0-6 Units, 0-6 Units, Subcutaneous, TID WC, Bowser, Kaylyn Layer, NP   lipase/protease/amylase (CREON) capsule 36,000 Units, 36,000 Units, Oral, BID WC, Bowser, Kaylyn Layer, NP, 36,000 Units at 10/27/22 1610   meclizine (ANTIVERT) tablet 12.5 mg, 12.5 mg, Oral, TID PRN, Regalado, Belkys A, MD, 12.5 mg at 10/27/22 1214   [START ON 10/28/2022] metoprolol succinate (TOPROL-XL) 24 hr tablet 25 mg, 25 mg, Oral, Daily, Little Ishikawa, MD   olopatadine (PATANOL) 0.1 % ophthalmic solution 1 drop, 1 drop, Both Eyes, Daily PRN, Bowser, Grace E, NP   ondansetron (ZOFRAN) injection 4 mg, 4 mg, Intravenous, Q6H PRN, Bowser, Kaylyn Layer, NP   Oral care mouth rinse, 15 mL, Mouth Rinse, PRN, Byrum, Les Pou, MD   polyethylene glycol (MIRALAX / GLYCOLAX) packet 17 g, 17 g, Oral, Daily PRN, Bowser, Kaylyn Layer, NP   prednisoLONE acetate (PRED FORTE) 1 % ophthalmic suspension 1 drop, 1 drop, Both Eyes, BID, Bowser, Grace E, NP, 1 drop at 10/24/22 2122   rOPINIRole (REQUIP) tablet 0.25 mg, 0.25 mg, Oral, Daily PRN, Regalado, Belkys A, MD, 0.25 mg at 10/27/22 1527   rOPINIRole (REQUIP) tablet 0.5 mg, 0.5 mg, Oral, QHS, Regalado, Belkys A, MD, 0.5 mg at 10/26/22 2124   sevelamer carbonate (RENVELA) tablet 2,400 mg, 2,400 mg, Oral, TID WC, Delano Metz, MD, 2,400 mg at 10/27/22 1214   valACYclovir (VALTREX) tablet 500 mg, 500 mg, Oral, QHS, Bowser, Grace E, NP, 500 mg at 10/24/22 2121     Patients Current Diet:  Diet Order                  Diet regular Room service appropriate? Yes with Assist; Fluid consistency: Thin; Fluid restriction: 1500 mL Fluid  Diet effective now                         Precautions / Restrictions Precautions Precautions: Fall Restrictions Weight Bearing Restrictions: Yes LLE Weight Bearing: Weight bearing as tolerated Other Position/Activity Restrictions: use RW per ortho MD Reynolds    Has the patient had 2 or more  falls or a fall with injury in the past year?Yes   Prior Activity Level Community (5-7x/wk): leaves house ~3-4 days/week   Prior Functional Level Prior Function Prior Level of Function : Independent/Modified Independent Mobility Comments: Per chart review, pt furniture walks in her home and uses a RW in the community. Per pt report this day, she uses a walking stick most of the time in and out of the home and a Rollator sometimes. ADLs Comments: Pt reports Supervision for ADLs and IADLs from son and daughter.   Self Care: Did the patient need help bathing, dressing, using the toilet or eating?  Independent   Indoor Mobility: Did the patient need assistance with walking from room to room (with or without device)? Independent   Stairs: Did the patient need assistance with internal or external stairs (with or without device)? Independent   Functional Cognition: Did the patient need help planning regular tasks such as shopping or remembering to take medications? Needed some help  Patient Information Are you of Hispanic, Latino/a,or Spanish origin?: A. No, not of Hispanic, Latino/a, or Spanish origin What is your race?: A. White Do you need or want an interpreter to communicate with a doctor or health care staff?: 0. No   Patient's Response To:  Health Literacy  and Transportation Health Literacy - How often do you need to have someone help you when you read instructions, pamphlets, or other written material from your doctor or pharmacy?: Never In the past 12 months, has lack of transportation kept you from medical appointments or from getting medications?: No In the past 12 months, has lack of transportation kept you from meetings, work, or from getting things needed for daily living?: No   Home Assistive Devices / Equipment Home Equipment: Agricultural consultant (2 wheels), Shower seat, Occupational hygienist (4 wheels), Wheelchair - manual, [employer] - single point   Prior Device Use: Indicate devices/aids used  by the patient prior to current illness, exacerbation or injury? Walker and cane   Current Functional Level Cognition   Arousal/Alertness: Lethargic Overall Cognitive Status: Impaired/Different from baseline Orientation Level: Oriented X4 General Comments: Uncertain of pt's baseline. Pt presents AAOx4 but with confusion regarding why she is seeing multiple care providers in hospital. Pt with impaired safety awareness and limited insight into deficits. Pt able to follow novel 1 step commands with increased time and verbal and tactile cues. Pt sequencing familiar tasks (ex. brushing teeth) appropriately without cues. Pt with Emergent awareness and SUstained attention. Attention: Sustained Sustained Attention: Appears intact Memory: Impaired Memory Impairment: Decreased recall of new information Awareness: Appears intact Safety/Judgment: Appears intact    Extremity Assessment (includes Sensation/Coordination)   Upper Extremity Assessment: Defer to OT evaluation (general bil weakness, but functional use for transitions/transfers and use of the RW) RUE Deficits / Details: gerneralized weakness; pt reposts hx of numbness in thumb and first finger (worse on Right); decreased fine motor coordinaiton RUE Sensation: history of peripheral neuropathy RUE Coordination: decreased fine motor LUE Deficits / Details: gerneralized weakness; pt reposts hx of numbness in thumb and first finger (worse on Right); decreased fine motor coordinaiton LUE Sensation: history of peripheral neuropathy LUE Coordination: decreased fine motor  Lower Extremity Assessment: Generalized weakness, LLE deficits/detail LLE Deficits / Details: painful quads.  Slow to move due to stiffness LLE Coordination: decreased fine motor     ADLs   Overall ADL's : Needs assistance/impaired Eating/Feeding: Set up, Bed level (with HOB elevated) Grooming: Set up, Bed level (with HOB elevated) Upper Body Bathing: Minimal assistance, Bed  level (with HOB elevated) Lower Body Bathing: Moderate assistance, Bed level (with HOB elevated) Upper Body Dressing : Contact guard assist, Bed level (with HOB elevated) Lower Body Dressing: Maximal assistance, Bed level (with HOB elevated) General ADL Comments: ADLs assessed from bed level secondary to pt declining sitting EOB or in recliner secondary to pt reporting concern over A Fib and pain in Left thigh. OT educated pt in importance of OOB activity and in RN giving pt the OK to participate in OT session with pt continuing to decline.     Mobility   Overal bed mobility: Needs Assistance Bed Mobility: Supine to Sit, Sit to Supine Rolling: Min assist, Used rails Supine to sit: Min assist Sit to supine: Min assist General bed mobility comments: assisted over to R UE.  Up with R elbow to sitting EOB.  Returned with LE assist     Transfers   Overall transfer level: Needs assistance Transfers: Sit to/from Stand Sit to Stand: Min assist General transfer comment: Pt declined sititng EOB and OOB activity this session.     Ambulation / Gait / Stairs / Wheelchair Mobility   Ambulation/Gait Ambulation/Gait assistance: Editor, commissioning (Feet): 6 Feet (Forward and back) Assistive device: Rolling walker (2 wheels) Gait Pattern/deviations: Step-to  pattern General Gait Details: step to pattern with antalgic gait on the L.  generally tentative and painful. Gait velocity interpretation: <1.31 ft/sec, indicative of household ambulator     Posture / Balance Dynamic Sitting Balance Sitting balance - Comments: Pt declined sititng EOB Balance Overall balance assessment: Needs assistance Sitting-balance support: No upper extremity supported, Feet supported Sitting balance-Leahy Scale: Fair Sitting balance - Comments: Pt declined sititng EOB Standing balance-Leahy Scale: Fair Standing balance comment: Due to L LE pain, pt relied on UE assist of the Rollator.but could let go of either UE and  maintain balance.     Special needs/care consideration Dialysis: Hemodialysis Monday, Wednesday, and Friday, Skin Pressure Injury: arm/lower, posterior, right; Skin Tear: arm/left, lower, posterior, proximal; Stage 2 Pressure Injury: buttocks/right; Stage 1 Pressure Injury: buttocks/left; Erythema/Redness: arm/bilateral; Ecchymosis: arm, head, hip/bialteral; Abrasion: arm/bilateral , Bowel and Bladder incontinence, Nephrostomy and Diabetic management Novolog 0-6 units 3x daily with meals        Previous Home Environment (from acute therapy documentation) Living Arrangements: Alone, Children (son stays with her and sometimes her daugther as well)  Lives With: Son (daughter stays as well when she's in town) Available Help at Discharge: Family, Available 24 hours/day Type of Home: House Home Layout: Able to live on main level with bedroom/bathroom, Laundry or work area in basement Alternate Teacher, music of Steps: ~13 Home Access: Stairs to enter Entrance Stairs-Rails: Left, Right (One set of steps has rail on left. The other set of steps has rails on both sides which cannot be reached at the same time.) Entrance Stairs-Number of Steps: 13, 4 Bathroom Shower/Tub: Engineer, manufacturing systems: Handicapped height Bathroom Accessibility: Yes How Accessible: Accessible via walker Home Care Services: No Additional Comments: Pt appears to be an unreliable reporter. During this session, pt reports that son and daughter stay with her 24/7, that son and daughter are taking turns staying with pt, that daughter is now teaching in [place], that no one was home when she fell, that her daughter was downstairs and she was upstairs when she fell, and that she does not go upstairs. Per chart, family found pt asleep on the floor when son-in-law arrived to take pt to HD.   Discharge Living Setting Plans for Discharge Living Setting: Patient's home Type of Home at Discharge: House Discharge Home Layout:  Able to live on main level with bedroom/bathroom, Laundry or work area in basement Discharge Home Access: Stairs to enter Entrance Stairs-Rails: Right, Left (One set of steps has rail on left. The other set of steps has rail on both sides which cannot be reached at the same time.) Entrance Stairs-Number of Steps: 13, 4 Discharge Bathroom Shower/Tub: Tub/shower unit Discharge Bathroom Toilet: Handicapped height Discharge Bathroom Accessibility: Yes How Accessible: Accessible via walker Does the patient have any problems obtaining your medications?: No   Social/Family/Support Systems Anticipated Caregiver: Nakyah Ambrocio, son and other family Anticipated Caregiver's Contact Information: [phone] Caregiver Availability: 24/7 Discharge Plan Discussed with Primary Caregiver: Yes Is Caregiver In Agreement with Plan?: Yes Does Caregiver/Family have Issues with Lodging/Transportation while Pt is in Rehab?: No     Goals Patient/Family Goal for Rehab:  PT/OT/SLP Mod I to supervision Expected length of stay: 7-10 days Pt/Family Agrees to Admission and willing to participate: Yes Program Orientation Provided & Reviewed with Pt/Caregiver Including Roles  & Responsibilities: Yes     Decrease burden of Care through IP rehab admission: NA     Possible need for SNF placement upon discharge:Not anticipated  Patient Condition: This patient's condition remains as documented in the consult dated 10/28/22, in which the Rehabilitation Physician determined and documented that the patient's condition is appropriate for intensive rehabilitative care in an inpatient rehabilitation facility. Will admit to inpatient rehab today.   Preadmission Screen Completed By:  Domingo Pulse, CCC-SLP, 10/27/2022 3:45 PM ______________________________________________________________________   Discussed status with Dr. Natale Lay on 10/30/22 at 930 and received approval for admission today.   Admission  Coordinator:  Domingo Pulse, time [id number] Dorna Bloom 10/30/22

## 2022-10-30 NOTE — Procedures (Signed)
HD Note:  Some information was entered later than the data was gathered due to patient care needs. The stated time with the data is accurate.  Received patient in bed to unit.  She stated that she was very dizzy and had been diagnosed with vertigo.  Did not want medication at this time.  Alert and oriented.   Informed consent signed and in chart.   Access used: Upper left arm fistula Access issues: None  Patient tolerated treatment well.   TX duration: 3.5 hours  Alert, without acute distress.  Total UF removed: 1500 ml  Hand-off given to patient's nurse.   Transported back to the room   Ayleah Hofmeister L. Dareen Piano, RN Kidney Dialysis Unit.

## 2022-10-30 NOTE — Discharge Summary (Signed)
Physician Discharge Summary  Kim Burns VHQ:[id number] DOB: [birth year] DOA: 10/21/2022  PCP: Lewis Moccasin, MD  Admit date: 10/21/2022 Discharge date: 10/30/2022  Admitted From: Home Disposition: CIR  Recommendations for Outpatient Follow-up:  Follow up with PCP in 1-2 weeks Please obtain BMP/CBC in one week your next doctors visit.  Continue amiodarone 200 mg twice daily for 1 week followed by 200 mg daily. Now on daily aspirin.  No long-term anticoagulation   Discharge Condition: Stable CODE STATUS: Full full code Diet recommendation: Heart healthy/renal  Brief/Interim Summary:  Brief Narrative: 87 year old past medical history significant for Whipple procedure, small bowel resection, due to nonmalignant pancreatic mass, RCC with left renal embolization and ablation with nephrostomy tube in place, hypertension, A-fib not on anticoagulation, ESRD on hemodialysis MWF, her son-in-law went to pick her up for hemodialysis on 9/2 and found her asleep on the floor.  She does not remember falling.  Evaluation in the ED patient was found to have a right frontal hemorrhagic contusion and nondisplaced left parietal and occipital fracture, left posterior scalp hematoma, small nondisplaced S4 fracture.  Patient admitted for traumatic brain injury after a fall with apparent loss of consciousness versus mechanical fall with intact consciousness.  Patient was evaluated by neurosurgery, admitted to the ICU,  systolic blood pressure recommended 90-160.   During course of hospitalization patient was found to have A-fib with RVR, required IV amiodarone, cardiology consulted.  Subsequently transition to oral amiodarone.  During workup  MRI was obtained which showed acute versus subacute ischemic nonhemorrhagic infarct.  Evaluated by neurology who recommended starting a baby aspirin.   CIR approved this afternoon, will discharge the patient to CIR.   Assessment & Plan:   Principal Problem:    Traumatic brain injury [facility]) Active Problems:   Contusion of right cerebral hemisphere [facility])   Closed skull fracture (HCC)   Sacral fracture, closed (HCC)   Exocrine pancreatic insufficiency   Hypertension   Acute systolic heart failure (HCC)   Atrial fibrillation, rapid (HCC)   Pressure injury of skin   Lacunar infarct, acute (HCC)   TBI, Right frontal contrecoup hemorrhagic cerebral contusion -Nondisplaced left parietal and left occipital skull fracture -Scalp hematoma -Skull fracture: -CT head is stable.  Initial neurosurgery recommendations was to hold aspirin for 1 week and DVT prophylaxis for 72 hours from 9/3.  Repeat CT head on 9/5 shows stable hemorrhagic contusion. EEG-unremarkable    Acute/subacute ischemic nonhemorrhagic Infarct:  Seen on the MRI brain. -ECHO: EF 45-50 % global hypokinesis. Per cardiology likely related to A fib. Out patient evaluation.  LDL 38, A1c 6.8 Carotid Dopplers and EEG are negative   A fib RVR;  New acute systolic, Heart failure.  Initially required IV amiodarone, cardiology consulted.  Now on amiodarone 20 mg twice daily for 1 week followed by 20 mg daily.  Not on prior anticoagulation due to bleeding from nephrostomy tube and now with hemorrhagic contusion   Hypertension/hypotension As needed midodrine during dialysis   ESRD on hemodialysis Per nephro  Dizziness - Meclizine as needed     RCC s/p embolization/ablation, status post left nephrostomy tube Status post nephrostomy tube exchange on Status post left nephrostomy tube exchange 9/3     S/ post Whipple 2018 Exocrine pancreatic insufficiency   IDDM:  -SSI   Frequent fall.  Left inferior pubic ramus minimally displaced fracture and probably displaced fracture of the left superior pubic ramus Subtle nondisplaced fracture of the anterior cortex of the sacrum at S4. Weightbearing  as tolerated follow-up outpatient   Restless leg Syndrome:  Resume home dose requip.     Estimated body mass index is 21.4 kg/m as calculated from the following:   Height as of this encounter: 5\' 3"  (1.6 m).   Weight as of this encounter: 54.8 kg.     DVT prophylaxis: SCD Code Status: Full code Family Communication:  Disposition Plan:  Status is: Inpatient Remains inpatient appropriate because: Discharge patient to CIR     Discharge Diagnoses:  Principal Problem:   Traumatic brain injury [facility]) Active Problems:   Contusion of right cerebral hemisphere [facility])   Closed skull fracture (HCC)   Sacral fracture, closed (HCC)   Exocrine pancreatic insufficiency   Hypertension   Acute systolic heart failure (HCC)   Atrial fibrillation, rapid (HCC)   Pressure injury of skin   Lacunar infarct, acute [facility])      Consultations: Neurosurgery Nephrology Cardiology Neurology Orthopedic    Discharge Exam: Vitals:   10/30/22 1234 10/30/22 1405  BP: 129/76 132/69  Pulse: 74 66  Resp: 14 20  Temp:  97.9 F (36.6 C)  SpO2: 99% 100%   Vitals:   10/30/22 1145 10/30/22 1215 10/30/22 1234 10/30/22 1405  BP: 121/65 117/70 129/76 132/69  Pulse: 75 67 74 66  Resp: 15 (!) 21 14 20   Temp:    97.9 F (36.6 C)  TempSrc:    Oral  SpO2: 99% 98% 99% 100%  Weight:   55.4 kg   Height:          Discharge Instructions   Allergies as of 10/30/2022       Reactions   Lopid [gemfibrozil] Other (See Comments)   Myalgia    Lotemax [loteprednol Etabonate] Rash   Skin burning  Blurry vision   Statins Other (See Comments)   Rosuvastatin Muscle weakness   Norco [hydrocodone-acetaminophen] Other (See Comments)   Headaches  Tolerates acetaminophen    Other Diarrhea   Real Butter - diarrhea   Vibra-tab [doxycycline] Other (See Comments)   Unknown reaction   Amlodipine Other (See Comments)   Norvasc   Codeine Other (See Comments)   headache   Hydrocodone Other (See Comments)   Hydrocodone-acetaminophen Other (See Comments)   Headache. Tolerates acetaminophen.    Lisinopril Other (See Comments)   Verapamil Other (See Comments)        Medication List     STOP taking these medications    metoprolol tartrate 50 MG tablet Commonly known as: LOPRESSOR       TAKE these medications    amiodarone 200 MG tablet Commonly known as: PACERONE Take 1 tablet (200 mg total) by mouth 2 (two) times daily for 4 days, THEN 1 tablet (200 mg total) daily for 26 days. Start taking on: October 30, 2022 What changed: See the new instructions.   aspirin 81 MG chewable tablet Chew 1 tablet (81 mg total) by mouth daily. Start taking on: October 31, 2022   Brinzolamide-Brimonidine 1-0.2 % Susp Apply 1 drop to eye 2 (two) times daily.   colestipol 1 g tablet Commonly known as: COLESTID Take 1 g by mouth See admin instructions. Take 1g (1 tablet) by mouth on MWF before dialysis in addition to 1g (1 tablet) as needed for diarrhea.   Creon 36000 UNITS Cpep capsule Generic drug: lipase/protease/amylase Take 36,000-72,000 Units by mouth See admin instructions. Take 72,000 units (2 capsules) by mouth with meals and 36,000 units (1 capsule) with snacks.   DIALYVITE 800 WITH ZINC 0.8  MG Tabs Take 1 tablet by mouth daily.   diclofenac Sodium 1 % Gel Commonly known as: VOLTAREN Apply 2 g topically 3 (three) times daily as needed (pain).   diphenhydramine-acetaminophen 25-500 MG Tabs tablet Commonly known as: TYLENOL PM Take 2 tablets by mouth at bedtime.   docusate sodium 100 MG capsule Commonly known as: COLACE Take 1 capsule (100 mg total) by mouth 2 (two) times daily as needed for mild constipation.   erythromycin ophthalmic ointment Place into the left eye 2 (two) times daily.   folic acid 1 MG tablet Commonly known as: FOLVITE Take 1 tablet (1 mg total) by mouth daily. What changed: how much to take   insulin aspart 100 UNIT/ML injection Commonly known as: novoLOG Inject 0-6 Units into the skin 3 (three) times daily with meals. What  changed:  when to take this additional instructions   lidocaine-prilocaine cream Commonly known as: EMLA Apply 1 Application topically every Monday, Wednesday, and Friday with hemodialysis.   meclizine 12.5 MG tablet Commonly known as: ANTIVERT Take 1 tablet (12.5 mg total) by mouth 3 (three) times daily as needed for dizziness.   metoprolol succinate 25 MG 24 hr tablet Commonly known as: TOPROL-XL Take 1 tablet (25 mg total) by mouth daily. Start taking on: October 31, 2022   midodrine 10 MG tablet Commonly known as: PROAMATINE Take 10 mg on dialysis days Mon-Wed-Fri   Muscle Rub 10-15 % Crea Apply 1 Application topically as needed for muscle pain.   Pataday 0.1 % ophthalmic solution Generic drug: olopatadine Place 1 drop into both eyes 2 (two) times daily as needed for allergies.   prednisoLONE acetate 1 % ophthalmic suspension Commonly known as: PRED FORTE Place 1 drop into the left eye 2 (two) times daily.   Restasis 0.05 % ophthalmic emulsion Generic drug: cycloSPORINE Place 1 drop into both eyes as needed (yes irratation).   rOPINIRole 0.25 MG tablet Commonly known as: REQUIP Take 1 tablet (0.25 mg total) by mouth 2 (two) times daily. What changed:  how much to take when to take this additional instructions   Sensipar 30 MG tablet Generic drug: cinacalcet Take 30 mg by mouth every Monday, Wednesday, and Friday.   sevelamer carbonate 800 MG tablet Commonly known as: RENVELA Take 1,600-2,400 mg by mouth See admin instructions. Take 2,400mg  (3 tablets) by mouth with meals and 1,600mg  (2 tablets) with snacks   valACYclovir 500 MG tablet Commonly known as: VALTREX Take 1 tablet (500 mg total) by mouth at bedtime.        Allergies  Allergen Reactions   Lopid [Gemfibrozil] Other (See Comments)    Myalgia    Lotemax [Loteprednol Etabonate] Rash    Skin burning  Blurry vision   Statins Other (See Comments)    Rosuvastatin Muscle weakness   Norco  [Hydrocodone-Acetaminophen] Other (See Comments)    Headaches  Tolerates acetaminophen    Other Diarrhea    Real Butter - diarrhea   Vibra-Tab [Doxycycline] Other (See Comments)    Unknown reaction   Amlodipine Other (See Comments)    Norvasc   Codeine Other (See Comments)    headache   Hydrocodone Other (See Comments)   Hydrocodone-Acetaminophen Other (See Comments)    Headache. Tolerates acetaminophen.   Lisinopril Other (See Comments)   Verapamil Other (See Comments)    You were cared for by a hospitalist during your hospital stay. If you have any questions about your discharge medications or the care you received while you were  in the hospital after you are discharged, you can call the unit and asked to speak with the hospitalist on call if the hospitalist that took care of you is not available. Once you are discharged, your primary care physician will handle any further medical issues. Please note that no refills for any discharge medications will be authorized once you are discharged, as it is imperative that you return to your primary care physician (or establish a relationship with a primary care physician if you do not have one) for your aftercare needs so that they can reassess your need for medications and monitor your lab values.  You were cared for by a hospitalist during your hospital stay. If you have any questions about your discharge medications or the care you received while you were in the hospital after you are discharged, you can call the unit and asked to speak with the hospitalist on call if the hospitalist that took care of you is not available. Once you are discharged, your primary care physician will handle any further medical issues. Please note that NO REFILLS for any discharge medications will be authorized once you are discharged, as it is imperative that you return to your primary care physician (or establish a relationship with a primary care physician if you do  not have one) for your aftercare needs so that they can reassess your need for medications and monitor your lab values.  Please request your Prim.MD to go over all Hospital Tests and Procedure/Radiological results at the follow up, please get all Hospital records sent to your Prim MD by signing hospital release before you go home.  Get CBC, CMP, 2 view Chest X ray checked  by Primary MD during your next visit or SNF MD in 5-7 days ( we routinely change or add medications that can affect your baseline labs and fluid status, therefore we recommend that you get the mentioned basic workup next visit with your PCP, your PCP may decide not to get them or add new tests based on their clinical decision)  On your next visit with your primary care physician please Get Medicines reviewed and adjusted.  If you experience worsening of your admission symptoms, develop shortness of breath, life threatening emergency, suicidal or homicidal thoughts you must seek medical attention immediately by calling 911 or calling your MD immediately  if symptoms less severe.  You Must read complete instructions/literature along with all the possible adverse reactions/side effects for all the Medicines you take and that have been prescribed to you. Take any new Medicines after you have completely understood and accpet all the possible adverse reactions/side effects.   Do not drive, operate heavy machinery, perform activities at heights, swimming or participation in water activities or provide baby sitting services if your were admitted for syncope or siezures until you have seen by Primary MD or a Neurologist and advised to do so again.  Do not drive when taking Pain medications.   Procedures/Studies: EEG adult  Result Date: 09-Nov-2022 Charlsie Quest, MD     11/09/22  4:56 PM Patient Name: ALEXICIA CELLINI MRN: [id number] Epilepsy Attending: Charlsie Quest Referring Physician/Provider: Tomie China, MD Date: 11/09/2022  Duration: 23.23 mins Patient history: 87yo F with ams getting eeg to evaluate for seizure Level of alertness: Awake, asleep AEDs during EEG study: None Technical aspects: This EEG study was done with scalp electrodes positioned according to the 10-20 International system of electrode placement. Electrical activity was reviewed with band pass filter  of 1-70Hz , sensitivity of 7 uV/mm, display speed of 61mm/sec with a 60Hz  notched filter applied as appropriate. EEG data were recorded continuously and digitally stored.  Video monitoring was available and reviewed as appropriate. Description: The posterior dominant rhythm consists of 8-9 Hz activity of moderate voltage (25-35 uV) seen predominantly in posterior head regions, symmetric and reactive to eye opening and eye closing.Sleep was characterized by vertex waves, sleep spindles (12 to 14 Hz), maximal frontocentral region. EEG showed intermittent generalized 3 to 6 Hz theta-delta slowing. Physiologic photic driving was not seen during photic stimulation.  Hyperventilation was not performed.   ABNORMALITY - Intermittent slow, generalized IMPRESSION: This study is suggestive of mild diffuse encephalopathy, nonspecific etiology.No seizures or epileptiform discharges were seen throughout the recording. Priyanka O Yadav   VAS US CAROTID (at [facility] and [facility] only)  Result Date: 10/27/2022 Carotid Arterial Duplex Study Patient Name:  VIVIEN VILCHIS  Date of Exam:   10/27/2022 Medical Rec #: [id number]         Accession #:    [id number] Date of Birth: 1935/06/13         Patient Gender: F Patient Age:   64 years Exam Location:  [facility] Procedure:      VAS US CAROTID Referring Phys: Dewitt Hoes DE LA TORRE --------------------------------------------------------------------------------  Indications:      CVA. Risk Factors:     Hypertension, Diabetes, coronary artery disease. History of                   TIA. Other Factors:    Recent TBI. Comparison Study: No prior studies.  Performing Technologist: Jean Rosenthal RDMS, RVT  Examination Guidelines: A complete evaluation includes B-mode imaging, spectral Doppler, color Doppler, and power Doppler as needed of all accessible portions of each vessel. Bilateral testing is considered an integral part of a complete examination. Limited examinations for reoccurring indications may be performed as noted.  Right Carotid Findings: +----------+--------+--------+--------+------------------+------------------+           PSV cm/sEDV cm/sStenosisPlaque DescriptionComments           +----------+--------+--------+--------+------------------+------------------+ CCA Prox  64      11                                                   +----------+--------+--------+--------+------------------+------------------+ CCA Distal47      9                                 intimal thickening +----------+--------+--------+--------+------------------+------------------+ ICA Prox  41      9               heterogenous mild                    +----------+--------+--------+--------+------------------+------------------+ ICA Distal56      12                                                   +----------+--------+--------+--------+------------------+------------------+ ECA       52                                                           +----------+--------+--------+--------+------------------+------------------+ +----------+--------+-------+----------------+-------------------+  PSV cm/sEDV cmsDescribe        Arm Pressure (mmHG) +----------+--------+-------+----------------+-------------------+ [id number]             Multiphasic, WNL                    +----------+--------+-------+----------------+-------------------+ +---------+--------+--+--------+-+---------+ VertebralPSV cm/s42EDV cm/s9Antegrade +---------+--------+--+--------+-+---------+  Left Carotid Findings:  +----------+--------+--------+--------+------------------+------------------+           PSV cm/sEDV cm/sStenosisPlaque DescriptionComments           +----------+--------+--------+--------+------------------+------------------+ CCA Prox  59      9                                                    +----------+--------+--------+--------+------------------+------------------+ CCA Distal51      11                                intimal thickening +----------+--------+--------+--------+------------------+------------------+ ICA Prox  29      7               heterogenous mild                    +----------+--------+--------+--------+------------------+------------------+ ICA Distal46      12                                                   +----------+--------+--------+--------+------------------+------------------+ ECA       40                                                           +----------+--------+--------+--------+------------------+------------------+ +----------+--------+--------+----------------+-------------------+           PSV cm/sEDV cm/sDescribe        Arm Pressure (mmHG) +----------+--------+--------+----------------+-------------------+ Subclavian130             Multiphasic, WNL                    +----------+--------+--------+----------------+-------------------+ +---------+--------+--+--------+-+---------+ VertebralPSV cm/s33EDV cm/s5Antegrade +---------+--------+--+--------+-+---------+   Summary: Right Carotid: The extracranial vessels were near-normal with only minimal wall                thickening or plaque. Left Carotid: The extracranial vessels were near-normal with only minimal wall               thickening or plaque. Vertebrals:  Bilateral vertebral arteries demonstrate antegrade flow. Subclavians: Normal flow hemodynamics were seen in bilateral subclavian              arteries. *See table(s) above for measurements and  observations.  Electronically signed by Delia Heady MD on 10/27/2022 at 12:24:42 PM.    Final    MR ANGIO HEAD WO CONTRAST  Result Date: 10/26/2022 CLINICAL DATA:  Stroke, follow-up.  Traumatic injury. EXAM: MRA HEAD WITHOUT CONTRAST TECHNIQUE: Angiographic images of the Circle of Willis were acquired using MRA technique without intravenous contrast. COMPARISON:  MRI of the head without contrast 10/25/2022 FINDINGS: Anterior circulation: The internal carotid arteries are within  normal limits from the high cervical segments through the ICA termini. The A1 and M1 segments are normal. The anterior communicating artery is patent. The ACA and MCA branch vessels are normal bilaterally. Posterior circulation: The right vertebral artery is dominant. The vertebrobasilar junction basilar artery normal. Both posterior cerebral arteries originate from basilar tip. Posterior communicating arteries are patent bilaterally. The PCA branch vessels are within normal limits. Anatomic variants: None IMPRESSION: Normal MRA circle-of-Willis without evidence for significant proximal stenosis, aneurysm, or branch vessel occlusion. Electronically Signed   By: Marin Roberts M.D.   On: 10/26/2022 19:04   CT PELVIS WO CONTRAST  Result Date: 10/26/2022 CLINICAL DATA:  Pelvic pain.  Concern for left pubic bone fracture. EXAM: CT PELVIS WITHOUT CONTRAST TECHNIQUE: Multidetector CT imaging of the pelvis was performed following the standard protocol without intravenous contrast. RADIATION DOSE REDUCTION: This exam was performed according to the departmental dose-optimization program which includes automated exposure control, adjustment of the mA and/or kV according to patient size and/or use of iterative reconstruction technique. COMPARISON:  CT abdomen pelvis dated 03/22/2022 and left hip radiograph dated 10/25/2022. FINDINGS: Evaluation of this exam is limited in the absence of intravenous contrast. Urinary Tract:  The urinary bladder  is grossly unremarkable. Bowel: Moderate stool in the visualized colon. No bowel dilatation in the pelvis. Vascular/Lymphatic: Advanced aortoiliac atherosclerotic disease. No pelvic adenopathy. Reproductive:  Hysterectomy.  No adnexal masses. Other: Diffuse subcutaneous edema and anasarca. No fluid collection. Musculoskeletal: Minimally displaced fracture of the left inferior pubic ramus. Focal area of slight cortical angulation of the left superior pubic ramus adjacent to the symphysis pubis may represent a nondisplaced fracture. No other acute fracture. The bones are osteopenic. Moderate bilateral hip arthritic changes. There is degenerative changes of the visualized lower lumbar spine. IMPRESSION: 1. Minimally displaced fracture of the left inferior pubic ramus and probable nondisplaced fracture of the left superior pubic ramus. 2.  Aortic Atherosclerosis (ICD10-I70.0). Electronically Signed   By: Elgie Collard M.D.   On: 10/26/2022 16:34   MR BRAIN WO CONTRAST  Result Date: 10/26/2022 CLINICAL DATA:  Initial evaluation for neuro deficit, stroke suspected. EXAM: MRI HEAD WITHOUT CONTRAST TECHNIQUE: Multiplanar, multiecho pulse sequences of the brain and surrounding structures were obtained without intravenous contrast. COMPARISON:  Prior CT from 10/24/2022. FINDINGS: Brain: Examination degraded by motion artifact. Generalized age-related cerebral atrophy. Patchy and confluent T2/FLAIR hyperintensity involving the periventricular white matter, consistent with chronic small vessel ischemic disease, mild for age. Few small remote bilateral cerebellar infarcts noted. 7 mm focus of mild diffusion signal abnormality involving the subcortical posterior right frontal region (series 2, image 30), suspicious for a small acute to a early subacute ischemic infarct possible additional punctate acute to early subacute ischemic infarct noted involving the cortical anterior right frontal convexity (a series 2, image 32).  No associated hemorrhage or mass effect. No other evidence for acute or recent infarction. Contrecoup hemorrhagic contusion involving the anterior right frontal convexity is relatively stable measuring 2.6 x 2.0 x 0.9 cm. Surrounding vasogenic edema without significant regional mass effect. No other acute intracranial hemorrhage. Few scattered chronic micro hemorrhages noted involving the right cerebellum and bilateral cerebral hemispheres, likely small vessel/hypertensive in nature. No mass lesion. Ventricles normal size without hydrocephalus. Pituitary gland suprasellar region within normal limits. Small bilateral subdural collections again seen measuring up to 4 mm bilaterally, relatively similar to previous. Collections are mildly complex with some internal susceptibility artifact, suggesting blood products, more evident on the right. No significant mass  effect or midline shift. Basilar cisterns remain patent. Vascular: Loss of normal flow void within the left V4 segment, which could reflect slow flow and/or occlusion (series 5, image 3). Right vertebral artery dominant. Major intracranial vascular flow voids otherwise maintained. Skull and upper cervical spine: Craniocervical junction within normal limits. Bone marrow signal intensity normal. Left occipital scalp contusion noted. No calvarial fracture better seen on prior CT. Sinuses/Orbits: Prior bilateral ocular lens replacement. Globes and orbital soft tissues demonstrate no other acute finding. Paranasal sinuses are largely clear. No significant mastoid effusion. Other: None. IMPRESSION: 1. Stable size and morphology of contrecoup hemorrhagic contusion involving the anterior right frontal convexity. Similar localized edema without significant regional mass effect. 2. 7 mm focus of mild diffusion signal abnormality involving the subcortical posterior right frontal region, suspicious for a small acute to early subacute ischemic nonhemorrhagic infarct.  Possible additional punctate infarct involving the anterior right frontal lobe as above. 3. Small bilateral subdural collections measuring up to 4 mm bilaterally without significant mass effect or midline shift, similar to previous. 4. Loss of normal flow void within the left V4 segment, which could reflect slow flow and/or occlusion. 5. Underlying age-related cerebral atrophy with mild chronic small vessel ischemic disease, with a few small remote bilateral cerebellar infarcts. 6. Evolving left posterior scalp contusion. Known underlying calvarial fracture better seen on prior CT. Electronically Signed   By: Rise Mu M.D.   On: 10/26/2022 04:48   ECHOCARDIOGRAM COMPLETE  Result Date: 10/25/2022    ECHOCARDIOGRAM REPORT   Patient Name:   Ilani P Petrosian Date of Exam: 10/25/2022 Medical Rec #:  [id number]        Height:       63.0 in Accession #:    [id number]       Weight:       123.0 lb Date of Birth:  Mar 22, 1935        BSA:          1.573 m Patient Age:    87 years         BP:           148/81 mmHg Patient Gender: F                HR:           115 bpm. Exam Location:  Inpatient Procedure: 2D Echo, Color Doppler and Cardiac Doppler Indications:    Syncope  History:        Patient has prior history of Echocardiogram examinations, most                 recent 09/15/2022. CKD/ESRD and Stroke, Arrythmias:Atrial                 Fibrillation; Risk Factors:Hypertension and Diabetes.  Sonographer:    Milbert Coulter Referring Phys: [id number] BELKYS A REGALADO IMPRESSIONS  1. Left ventricular ejection fraction, by estimation, is 45 to 50%. Left ventricular ejection fraction by PLAX is 48 %. The left ventricle has mildly decreased function. The left ventricle demonstrates global hypokinesis. There is mild left ventricular hypertrophy. Left ventricular diastolic function could not be evaluated.  2. Right ventricular systolic function is mildly reduced. The right ventricular size is normal.  3. Left atrial size was  severely dilated.  4. Right atrial size was mildly dilated.  5. The mitral valve is abnormal. Mild to moderate mitral valve regurgitation.  6. The aortic valve is tricuspid. Aortic valve regurgitation is not visualized. Aortic valve sclerosis is  present, with no evidence of aortic valve stenosis.  7. The inferior vena cava is normal in size with greater than 50% respiratory variability, suggesting right atrial pressure of 3 mmHg.  8. Rhythm strip during this exam demonstrates atrial fibrillation and with rapid ventricular response. Comparison(s): Changes from prior study are noted. 09/15/2022: LVEF 55-60%. FINDINGS  Left Ventricle: Left ventricular ejection fraction, by estimation, is 45 to 50%. Left ventricular ejection fraction by PLAX is 48 %. The left ventricle has mildly decreased function. The left ventricle demonstrates global hypokinesis. The left ventricular internal cavity size was normal in size. There is mild left ventricular hypertrophy. Left ventricular diastolic function could not be evaluated due to atrial fibrillation. Left ventricular diastolic function could not be evaluated. Right Ventricle: The right ventricular size is normal. No increase in right ventricular wall thickness. Right ventricular systolic function is mildly reduced. Left Atrium: Left atrial size was severely dilated. Right Atrium: Right atrial size was mildly dilated. Pericardium: There is no evidence of pericardial effusion. Mitral Valve: The mitral valve is abnormal. There is mild calcification of the posterior and anterior mitral valve leaflet(s). Mild to moderate mitral annular calcification. Mild to moderate mitral valve regurgitation. Tricuspid Valve: The tricuspid valve is normal in structure. Tricuspid valve regurgitation is not demonstrated. Aortic Valve: The aortic valve is tricuspid. Aortic valve regurgitation is not visualized. Aortic valve sclerosis is present, with no evidence of aortic valve stenosis. Aortic valve  mean gradient measures 3.0 mmHg. Aortic valve peak gradient measures 5.3  mmHg. Aortic valve area, by VTI measures 2.26 cm. Pulmonic Valve: The pulmonic valve was normal in structure. Pulmonic valve regurgitation is not visualized. Aorta: The aortic root and ascending aorta are structurally normal, with no evidence of dilitation. Venous: The inferior vena cava is normal in size with greater than 50% respiratory variability, suggesting right atrial pressure of 3 mmHg. IAS/Shunts: No atrial level shunt detected by color flow Doppler. EKG: Rhythm strip during this exam demonstrates atrial fibrillation and with rapid ventricular response.  LEFT VENTRICLE PLAX 2D LV EF:         Left            Diastology                ventricular     LV e' medial:    12.00 cm/s                ejection        LV E/e' medial:  10.4                fraction by     LV e' lateral:   18.50 cm/s                PLAX is 48      LV E/e' lateral: 6.8                %. LVIDd:         3.80 cm LVIDs:         2.90 cm LV PW:         1.10 cm LV IVS:        1.20 cm LVOT diam:     1.90 cm LV SV:         38 LV SV Index:   24 LVOT Area:     2.84 cm  RIGHT VENTRICLE RV Basal diam:  3.50 cm RV Mid diam:    2.70 cm RV S prime:  12.20 cm/s TAPSE (M-mode): 1.0 cm LEFT ATRIUM             Index        RIGHT ATRIUM           Index LA diam:        4.30 cm 2.73 cm/m   RA Area:     16.50 cm LA Vol (A2C):   64.3 ml 40.88 ml/m  RA Volume:   40.70 ml  25.88 ml/m LA Vol (A4C):   87.9 ml 55.89 ml/m LA Biplane Vol: 75.6 ml 48.07 ml/m  AORTIC VALVE AV Area (Vmax):    2.18 cm AV Area (Vmean):   1.93 cm AV Area (VTI):     2.26 cm AV Vmax:           115.00 cm/s AV Vmean:          85.000 cm/s AV VTI:            0.168 m AV Peak Grad:      5.3 mmHg AV Mean Grad:      3.0 mmHg LVOT Vmax:         88.40 cm/s LVOT Vmean:        58.000 cm/s LVOT VTI:          0.134 m LVOT/AV VTI ratio: 0.80  AORTA Ao Root diam: 2.60 cm MITRAL VALVE MV Area (PHT): 4.29 cm     SHUNTS MV  Decel Time: 177 msec     Systemic VTI:  0.13 m MV E velocity: 125.00 cm/s  Systemic Diam: 1.90 cm MV A velocity: 42.30 cm/s MV E/A ratio:  2.96 Zoila Shutter MD Electronically signed by Zoila Shutter MD Signature Date/Time: 10/25/2022/5:32:24 PM    Final    DG HIP UNILAT WITH PELVIS 2-3 VIEWS LEFT  Result Date: 10/25/2022 CLINICAL DATA:  Pain. EXAM: DG HIP (WITH OR WITHOUT PELVIS) 2-3V LEFT COMPARISON:  Lumbar spine CT 10/21/2022 FINDINGS: The bones are subjectively under mineralized. Irregularity of the left inferior pubic ramus may represent a nondisplaced fracture in the appropriate clinical setting. Known sacral fracture is not demonstrated by radiograph. Slight hip joint space narrowing and spurring. No erosive change or evidence of avascular necrosis. Pubic symphysis and sacroiliac joints are congruent. There are multiple pelvic phleboliths. Lower extremity arterial vascular calcifications. IMPRESSION: 1. Irregularity of the left inferior pubic ramus may represent a nondisplaced fracture in the appropriate clinical setting. 2. Mild left hip osteoarthritis. Electronically Signed   By: Narda Rutherford M.D.   On: 10/25/2022 10:00   CT HEAD WO CONTRAST ( )  Addendum Date: 10/24/2022   ADDENDUM REPORT: 10/24/2022 10:51 ADDENDUM: Study discussed by telephone with Dr. Hartley Barefoot on 10/24/2022 at 1039 hours. Electronically Signed   By: Odessa Fleming M.D.   On: 10/24/2022 10:51   Result Date: 10/24/2022 CLINICAL DATA:  87 year old female with traumatic brain injury, right inferior frontal gyrus hemorrhagic contusion. Altered mental status. EXAM: CT HEAD WITHOUT CONTRAST TECHNIQUE: Contiguous axial images were obtained from the base of the skull through the vertex without intravenous contrast. RADIATION DOSE REDUCTION: This exam was performed according to the departmental dose-optimization program which includes automated exposure control, adjustment of the mA and/or kV according to patient size and/or use of  iterative reconstruction technique. COMPARISON:  10/22/2022 and earlier. FINDINGS: Brain: Right inferior frontal gyrus rounded hemorrhagic contusion with regional hypodense edema is stable since presentation on 10/21/2022. No significant regional mass effect. Basilar cisterns remain patent. However, there is a small new  mixed density, low-density but not entirely CSF, left side subdural collection since 10/21/2022 measuring up to the 5 mm thickness along the left superior convexity on coronal image 41. And there is a smaller, subtle new right subdural collection since that time, about 2 mm. Trace new rightward midline shift, new since 10/22/2022. No other new intracranial hemorrhage. No IVH. No ventriculomegaly. Stable gray-white matter differentiation throughout the brain. No cortically based acute infarct identified. Vascular: Calcified atherosclerosis at the skull base. No suspicious intracranial vascular hyperdensity. Skull: Stable.  Nondisplaced left occipital bone fracture. Sinuses/Orbits: Visualized paranasal sinuses and mastoids are stable and well aerated. Other: Posterior convexity mildly lobulated scalp hematoma has not significantly changed. Orbits soft tissues appears stable and negative. IMPRESSION: 1. Small new Left > right Subdural Hematomas have developed since 10/21/2022, up to 5 mm on the left and 2 mm on the right. Trace new rightward midline shift, no other significant intracranial mass effect. 2. Right anterior frontal lobe hemorrhagic contusion remains stable. And no other new intracranial abnormality. 3. Nondisplaced left occipital bone fracture. Posterior scalp hematoma. Electronically Signed: By: Odessa Fleming M.D. On: 10/24/2022 10:37   IR NEPHROSTOMY EXCHANGE LEFT  Result Date: 10/22/2022 INDICATION: Chronic indwelling left nephrostomy catheter EXAM: FLUOROSCOPIC EXCHANGE OF THE 10 FRENCH LEFT NEPHROSTOMY COMPARISON:  08/26/2022 MEDICATIONS: 1% lidocaine local ANESTHESIA/SEDATION: None.  CONTRAST:  10 cc-administered into the collecting system(s) FLUOROSCOPY: Radiation Exposure Index (as provided by the fluoroscopic device): 7.0 mGy Kerma COMPLICATIONS: None immediate. PROCEDURE: Informed written consent was obtained from the patient after a thorough discussion of the procedural risks, benefits and alternatives. All questions were addressed. Maximal Sterile Barrier Technique was utilized including caps, mask, sterile gowns, sterile gloves, sterile drape, hand hygiene and skin antiseptic. A timeout was performed prior to the initiation of the procedure. Previous imaging reviewed. Initial catheter injection performed. Nephrostomy catheter is within the renal pelvis. Successful exchange performed over a Amplatz guidewire. Retention loop reformed in the renal pelvis. Contrast injection confirms position. Images obtained for documentation. Catheter secured with a skin suture and a sterile dressing. Gravity drainage bag connected. No immediate complication. Patient tolerated the procedure well. IMPRESSION: Successful fluoroscopic 10 French left nephrostomy exchange Electronically Signed   By: Judie Petit.  Shick M.D.   On: 10/22/2022 11:15   CT HEAD WO CONTRAST ( )  Result Date: 10/22/2022 CLINICAL DATA:  Traumatic brain injury. EXAM: CT HEAD WITHOUT CONTRAST TECHNIQUE: Contiguous axial images were obtained from the base of the skull through the vertex without intravenous contrast. RADIATION DOSE REDUCTION: This exam was performed according to the departmental dose-optimization program which includes automated exposure control, adjustment of the mA and/or kV according to patient size and/or use of iterative reconstruction technique. COMPARISON:  Yesterday FINDINGS: Brain: Right inferior frontal hematoma with rim of edema. The hemorrhagic portion measures 2 cm in diameter. No new site of hemorrhage or progression. No infarct, hydrocephalus, or shift Vascular: Atheromatous calcification. Skull: Nondepressed left  occipital bone fracture with overlying hematoma. Sinuses/Orbits: Negative IMPRESSION: No new finding or progression of the patient's right frontal hematoma. Electronically Signed   By: Tiburcio Pea M.D.   On: 10/22/2022 07:46   DG Foot 2 Views Right  Result Date: 10/21/2022 CLINICAL DATA:  Unwitnessed fall.  Right foot pain EXAM: RIGHT FOOT - 2 VIEW COMPARISON:  04/19/2021 FINDINGS: Two views of the right foot demonstrate no evidence of an acute fracture. No dislocation. Bones are demineralized. Hallux valgus deformity with mild osteoarthritis of the first MTP joint. Bidirectional calcaneal enthesophytes. Generalized  soft tissue swelling. Atherosclerotic vascular calcification. IMPRESSION: Generalized soft tissue swelling without acute fracture or dislocation of the right foot. Electronically Signed   By: Duanne Guess D.O.   On: 10/21/2022 17:32   CT Head Wo Contrast  Addendum Date: 10/21/2022   ADDENDUM REPORT: 10/21/2022 10:13 ADDENDUM: Study discussed by telephone with Dr. Shawna Orleans BELFI on 10/21/2022 at 0956 hours. Electronically Signed   By: Odessa Fleming M.D.   On: 10/21/2022 10:13   Result Date: 10/21/2022 CLINICAL DATA:  87 year old female dialysis patient with syncope, fall. Found down. EXAM: CT HEAD WITHOUT CONTRAST TECHNIQUE: Contiguous axial images were obtained from the base of the skull through the vertex without intravenous contrast. RADIATION DOSE REDUCTION: This exam was performed according to the departmental dose-optimization program which includes automated exposure control, adjustment of the mA and/or kV according to patient size and/or use of iterative reconstruction technique. COMPARISON:  Head CT 01/31/2022.  More recent brain MRI 09/14/2022. FINDINGS: Brain: Right anterior frontal lobe, inferior frontal gyrus hemorrhagic contusion is rounded, up to 2 cm diameter. Mild adjacent edema. No significant regional mass effect. No convincing subarachnoid or subdural blood. No IVH. No  ventriculomegaly. No midline shift. Basilar cisterns remain normal. Outside the area of acute parenchymal contusion the gray-white matter differentiation is stable. Vascular: Calcified atherosclerosis at the skull base. No suspicious intracranial vascular hyperdensity. Skull: Nondisplaced left parietal and occipital bone fracture. Sinuses/Orbits: Visualized paranasal sinuses and mastoids are stable and well aerated. Other: Leftward gaze. No acute orbit or scalp *CRASH* that broad-based left posterior scalp hematoma measures up to 12 mm thickness at the occiput put and there is an underlying nondisplaced left occipital bone fracture tracking toward but not directly involving the foramen magnum (series 4, image 20). Hematoma and the fracture tracks cephalad before dissipating. Traumatic Brain Injury Risk Stratification Skull Fracture: Nondisplaced - Moderate/mBIG 2 Subdural Hematoma (SDH): No - Low Subarachnoid Hemorrhage [facility]): No Epidural Hematoma (EDH): No - Low / mBIG 1 Cerebral contusion, intra-axial, intraparenchymal Hemorrhage (IPH): 8mm plus or multiple - High/mBIG 3 Intraventricular Hemorrhage (IVH): No - Low / mBIG 1 Midline Shift > 1mm or Edema/effacement of sulci/vents: No - Low / mBIG 1 ---------------------------------------------------- IMPRESSION: 1. Positive for acute coup contrecoup brain injury with nondisplaced left posterior skull fracture, right anterior frontal lobe hemorrhagic contusion. No midline shift or other complicating features. 2. Associated left posterior scalp hematoma. Electronically Signed: By: Odessa Fleming M.D. On: 10/21/2022 09:53   CT Lumbar Spine Wo Contrast  Result Date: 10/21/2022 CLINICAL DATA:  Back trauma, no prior imaging (Age >= 16y).  Pain EXAM: CT LUMBAR SPINE WITHOUT CONTRAST TECHNIQUE: Multidetector CT imaging of the lumbar spine was performed without intravenous contrast administration. Multiplanar CT image reconstructions were also generated. RADIATION DOSE REDUCTION:  This exam was performed according to the departmental dose-optimization program which includes automated exposure control, adjustment of the mA and/or kV according to patient size and/or use of iterative reconstruction technique. COMPARISON:  06/20/2022 FINDINGS: Segmentation: 5 lumbar type vertebrae. Alignment: No traumatic listhesis. Vertebrae: Diffuse osseous demineralization. Prior laminectomy on the right at L5. There is a subtle nondisplaced fracture of the anterior cortex of the sacrum at S4 (series 8, image 34). Lumbar vertebral body heights are maintained without evidence of fracture. No suspicious bone lesion. Paraspinal and other soft tissues: Aortic atherosclerosis. There is some mild wall thickening of the imaged distal sigmoid colon. Disc levels: Moderate-advanced multilevel degenerative disc disease of the lumbar spine. IMPRESSION: 1. Subtle nondisplaced fracture of the anterior cortex  of the sacrum at S4. 2. No acute fracture or traumatic listhesis of the lumbar spine. 3. Mild wall thickening of the imaged distal sigmoid colon. Correlate for signs and symptoms of colitis. 4. Aortic atherosclerosis (ICD10-I70.0). Electronically Signed   By: Duanne Guess D.O.   On: 10/21/2022 09:50   CT Cervical Spine Wo Contrast  Result Date: 10/21/2022 CLINICAL DATA:  Neck trauma.  Head trauma. EXAM: CT CERVICAL SPINE WITHOUT CONTRAST TECHNIQUE: Multidetector CT imaging of the cervical spine was performed without intravenous contrast. Multiplanar CT image reconstructions were also generated. RADIATION DOSE REDUCTION: This exam was performed according to the departmental dose-optimization program which includes automated exposure control, adjustment of the mA and/or kV according to patient size and/or use of iterative reconstruction technique. COMPARISON:  CT cervical spine 03/02/2018. FINDINGS: Alignment: Physiologic. Skull base and vertebrae: No evidence of acute cervical spine fracture or traumatic  subluxation. Soft tissues and spinal canal: No prevertebral fluid or swelling. No visible canal hematoma. Disc levels: Mildly progressive multilevel cervical spondylosis, especially at C3-4 where there is progressive loss of disc height and endplate osteophyte formation. Spondylosis remains greatest at C5-6 and C6-7, not grossly changed from previous study. No large disc herniation identified. Upper chest: Clear lung apices. Other: Severe bilateral carotid atherosclerosis. IMPRESSION: 1. No evidence of acute cervical spine fracture, traumatic subluxation or static signs of instability. 2. Mildly progressive multilevel cervical spondylosis. 3. Severe bilateral carotid atherosclerosis. Electronically Signed   By: Carey Bullocks M.D.   On: 10/21/2022 09:48   DG Chest Portable 1 View  Result Date: 10/21/2022 CLINICAL DATA:  87 year old female with history of chest pain. EXAM: PORTABLE CHEST 1 VIEW COMPARISON:  Chest x-ray 09/14/2022. FINDINGS: Lung volumes are normal. No consolidative airspace disease. No pleural effusions. No pneumothorax. No pulmonary nodule or mass noted. Pulmonary vasculature and the cardiomediastinal silhouette are within normal limits. Atherosclerosis in the thoracic aorta. IMPRESSION: 1. No radiographic evidence of acute cardiopulmonary disease. 2. Aortic atherosclerosis. Electronically Signed   By: Trudie Reed M.D.   On: 10/21/2022 08:45     The results of significant diagnostics from this hospitalization (including imaging, microbiology, ancillary and laboratory) are listed below for reference.     Microbiology: Recent Results (from the past 240 hour(s))  MRSA Next Gen by PCR, Nasal     Status: None   Collection Time: 10/21/22 12:32 PM   Specimen: Nasal Mucosa; Nasal Swab  Result Value Ref Range Status   MRSA by PCR Next Gen NOT DETECTED NOT DETECTED Final    Comment: (NOTE) The GeneXpert MRSA Assay (FDA approved for NASAL specimens only), is one component of a  comprehensive MRSA colonization surveillance program. It is not intended to diagnose MRSA infection nor to guide or monitor treatment for MRSA infections. Test performance is not FDA approved in patients less than 31 years old. Performed at [facility], [address], [place], [place] [address]      Labs: BNP (last 3 results) Recent Labs    09/14/22 0512  BNP 1,469.0*   Basic Metabolic Panel: Recent Labs  Lab 10/24/22 1536 10/26/22 0314 10/27/22 0550 10/28/22 0906 10/30/22 0825  NA 130* 131* 132* 130* 130*  K 3.8 3.4* 3.4* 3.5 3.5  CL 90* 91* 91* 93* 94*  CO2 23 24 25 23 23   GLUCOSE 145* 136* 123* 149* 172*  BUN 32* 19 30* 38* 31*  CREATININE 3.59* 2.72* 3.59* 4.23* 3.82*  CALCIUM 8.2* 7.8* 7.8* 7.6* 7.8*  MG  --   --   --   --  1.7  PHOS  --   --   --  6.5*  --    Liver Function Tests: Recent Labs  Lab 10/28/22 0906  ALBUMIN 2.0*   No results for input(s): "LIPASE", "AMYLASE" in the last 168 hours. No results for input(s): "AMMONIA" in the last 168 hours. CBC: Recent Labs  Lab 10/24/22 1536 10/28/22 0906 10/30/22 0825  WBC 7.7 8.8 7.7  HGB 13.0 10.9* 10.5*  HCT 40.4 33.4* 32.3*  MCV 96.4 94.1 95.6  PLT 195 214 242   Cardiac Enzymes: No results for input(s): "CKTOTAL", "CKMB", "CKMBINDEX", "TROPONINI" in the last 168 hours. BNP: Invalid input(s): "POCBNP" CBG: Recent Labs  Lab 10/29/22 1121 10/29/22 1641 10/29/22 2042 10/30/22 0550 10/30/22 1401  GLUCAP 120* 116* 239* 73 80   D-Dimer No results for input(s): "DDIMER" in the last 72 hours. Hgb A1c No results for input(s): "HGBA1C" in the last 72 hours. Lipid Profile No results for input(s): "CHOL", "HDL", "LDLCALC", "TRIG", "CHOLHDL", "LDLDIRECT" in the last 72 hours. Thyroid function studies No results for input(s): "TSH", "T4TOTAL", "T3FREE", "THYROIDAB" in the last 72 hours.  Invalid input(s): "FREET3" Anemia work up No results for input(s): "VITAMINB12", "FOLATE", "FERRITIN",  "TIBC", "IRON", "RETICCTPCT" in the last 72 hours. Urinalysis    Component Value Date/Time   COLORURINE YELLOW 10/29/2021 1530   APPEARANCEUR HAZY (A) 10/29/2021 1530   LABSPEC 1.015 10/29/2021 1530   PHURINE 7.0 10/29/2021 1530   GLUCOSEU NEGATIVE 10/29/2021 1530   HGBUR SMALL (A) 10/29/2021 1530   BILIRUBINUR NEGATIVE 10/29/2021 1530   KETONESUR NEGATIVE 10/29/2021 1530   PROTEINUR >=300 (A) 10/29/2021 1530   UROBILINOGEN 1.0 06/18/2013 1633   NITRITE NEGATIVE 10/29/2021 1530   LEUKOCYTESUR NEGATIVE 10/29/2021 1530   Sepsis Labs Recent Labs  Lab 10/24/22 1536 10/28/22 0906 10/30/22 0825  WBC 7.7 8.8 7.7   Microbiology Recent Results (from the past 240 hour(s))  MRSA Next Gen by PCR, Nasal     Status: None   Collection Time: 10/21/22 12:32 PM   Specimen: Nasal Mucosa; Nasal Swab  Result Value Ref Range Status   MRSA by PCR Next Gen NOT DETECTED NOT DETECTED Final    Comment: (NOTE) The GeneXpert MRSA Assay (FDA approved for NASAL specimens only), is one component of a comprehensive MRSA colonization surveillance program. It is not intended to diagnose MRSA infection nor to guide or monitor treatment for MRSA infections. Test performance is not FDA approved in patients less than 19 years old. Performed at [facility], [address] [place], [place] [address]      Time coordinating discharge:  I have spent 35 minutes face to face with the patient and on the ward discussing the patients care, assessment, plan and disposition with other care givers. >50% of the time was devoted counseling the patient about the risks and benefits of treatment/Discharge disposition and coordinating care.   SIGNED:   Miguel Rota, MD  [facility] 10/30/2022, 3:08 PM   If 7PM-7AM, please contact night-coverage

## 2022-10-30 NOTE — Plan of Care (Signed)
  Problem: Education: Goal: Ability to describe self-care measures that may prevent or decrease complications (Diabetes Survival Skills Education) will improve Outcome: Progressing Goal: Individualized Educational Video(s) Outcome: Progressing   Problem: Coping: Goal: Ability to adjust to condition or change in health will improve Outcome: Progressing   Problem: Fluid Volume: Goal: Ability to maintain a balanced intake and output will improve Outcome: Progressing   Problem: Health Behavior/Discharge Planning: Goal: Ability to identify and utilize available resources and services will improve Outcome: Progressing Goal: Ability to manage health-related needs will improve Outcome: Progressing   Problem: Metabolic: Goal: Ability to maintain appropriate glucose levels will improve Outcome: Progressing   Problem: Nutritional: Goal: Maintenance of adequate nutrition will improve Outcome: Progressing Goal: Progress toward achieving an optimal weight will improve Outcome: Progressing   Problem: Skin Integrity: Goal: Risk for impaired skin integrity will decrease Outcome: Progressing   Problem: Tissue Perfusion: Goal: Adequacy of tissue perfusion will improve Outcome: Progressing   Problem: Education: Goal: Knowledge of General Education information will improve Description: Including pain rating scale, medication(s)/side effects and non-pharmacologic comfort measures Outcome: Progressing   Problem: Health Behavior/Discharge Planning: Goal: Ability to manage health-related needs will improve Outcome: Progressing   Problem: Clinical Measurements: Goal: Ability to maintain clinical measurements within normal limits will improve Outcome: Progressing Goal: Will remain free from infection Outcome: Progressing Goal: Diagnostic test results will improve Outcome: Progressing Goal: Respiratory complications will improve Outcome: Progressing Goal: Cardiovascular complication will  be avoided Outcome: Progressing   Problem: Activity: Goal: Risk for activity intolerance will decrease Outcome: Progressing   Problem: Nutrition: Goal: Adequate nutrition will be maintained Outcome: Progressing   Problem: Coping: Goal: Level of anxiety will decrease Outcome: Progressing   Problem: Elimination: Goal: Will not experience complications related to bowel motility Outcome: Progressing Goal: Will not experience complications related to urinary retention Outcome: Progressing   Problem: Pain Managment: Goal: General experience of comfort will improve Outcome: Progressing   Problem: Safety: Goal: Ability to remain free from injury will improve Outcome: Progressing   Problem: Skin Integrity: Goal: Risk for impaired skin integrity will decrease Outcome: Progressing   Problem: Education: Goal: Knowledge of disease or condition will improve Outcome: Progressing Goal: Understanding of medication regimen will improve Outcome: Progressing   Problem: Cardiac: Goal: Ability to achieve and maintain adequate cardiopulmonary perfusion will improve Outcome: Progressing   Problem: Health Behavior/Discharge Planning: Goal: Ability to safely manage health-related needs after discharge will improve Outcome: Progressing   Problem: Education: Goal: Knowledge of disease or condition will improve Outcome: Progressing Goal: Knowledge of secondary prevention will improve (MUST DOCUMENT ALL) Outcome: Progressing Goal: Knowledge of patient specific risk factors will improve [name] N/A or DELETE if not current risk factor) Outcome: Progressing   Problem: Ischemic Stroke/TIA Tissue Perfusion: Goal: Complications of ischemic stroke/TIA will be minimized Outcome: Progressing   Problem: Coping: Goal: Will verbalize positive feelings about self Outcome: Progressing Goal: Will identify appropriate support needs Outcome: Progressing   Problem: Health Behavior/Discharge  Planning: Goal: Ability to manage health-related needs will improve Outcome: Progressing Goal: Goals will be collaboratively established with patient/family Outcome: Progressing   Problem: Self-Care: Goal: Ability to participate in self-care as condition permits will improve Outcome: Progressing Goal: Verbalization of feelings and concerns over difficulty with self-care will improve Outcome: Progressing Goal: Ability to communicate needs accurately will improve Outcome: Progressing   Problem: Nutrition: Goal: Risk of aspiration will decrease Outcome: Progressing Goal: Dietary intake will improve Outcome: Progressing

## 2022-10-30 NOTE — Progress Notes (Signed)
PROGRESS NOTE    Kim Burns  NGE:[id number] DOB: 03/21/1935 DOA: 10/21/2022 PCP: Lewis Moccasin, MD     Brief Narrative: 87 year old past medical history significant for Whipple procedure, small bowel resection, due to nonmalignant pancreatic mass, RCC with left renal embolization and ablation with nephrostomy tube in place, hypertension, A-fib not on anticoagulation, ESRD on hemodialysis MWF, her son-in-law went to pick her up for hemodialysis on 9/2 and found her asleep on the floor.  She does not remember falling.  Evaluation in the ED patient was found to have a right frontal hemorrhagic contusion and nondisplaced left parietal and occipital fracture, left posterior scalp hematoma, small nondisplaced S4 fracture.  Patient admitted for traumatic brain injury after a fall with apparent loss of consciousness versus mechanical fall with intact consciousness.  Patient was evaluated by neurosurgery, admitted to the ICU,  systolic blood pressure recommended 90-160.   During course of hospitalization patient was found to have A-fib with RVR, required IV amiodarone, cardiology consulted.  Subsequently transition to oral amiodarone.  During workup  MRI was obtained which showed acute versus subacute ischemic nonhemorrhagic infarct.  Evaluated by neurology who recommended starting a baby aspirin.  Current recommendations are for CIR, pending placement.   Assessment & Plan:   Principal Problem:   Traumatic brain injury [facility]) Active Problems:   Contusion of right cerebral hemisphere [facility])   Closed skull fracture (HCC)   Sacral fracture, closed (HCC)   Exocrine pancreatic insufficiency   Hypertension   Acute systolic heart failure (HCC)   Atrial fibrillation, rapid (HCC)   Pressure injury of skin   Lacunar infarct, acute (HCC)   TBI, Right frontal contrecoup hemorrhagic cerebral contusion -Nondisplaced left parietal and left occipital skull fracture -Scalp hematoma -Skull  fracture: -CT head is stable.  Initial neurosurgery recommendations was to hold aspirin for 1 week and DVT prophylaxis for 72 hours from 9/3.  Repeat CT head on 9/5 shows stable hemorrhagic contusion. EEG-unremarkable Awaiting CIR placement   Acute/subacute ischemic nonhemorrhagic Infarct:  Seen on the MRI brain. -ECHO: EF 45-50 % global hypokinesis. Per cardiology likely related to A fib. Out patient evaluation.  LDL 38, A1c 6.8 Carotid Dopplers and EEG are negative   A fib RVR;  New acute systolic, Heart failure.  Initially required IV amiodarone, cardiology consulted.  Now on amiodarone 20 mg twice daily for 1 week followed by 20 mg daily.  Not on prior anticoagulation due to bleeding from nephrostomy tube and now with hemorrhagic contusion   Hypertension/hypotension As needed midodrine during dialysis   ESRD on hemodialysis Per nephro  Dizziness - Meclizine as needed     RCC s/p embolization/ablation, status post left nephrostomy tube Status post nephrostomy tube exchange on Status post left nephrostomy tube exchange 9/3     S/ post Whipple 2018 Exocrine pancreatic insufficiency   IDDM:  -SSI   Frequent fall.  Left inferior pubic ramus minimally displaced fracture and probably displaced fracture of the left superior pubic ramus Subtle nondisplaced fracture of the anterior cortex of the sacrum at S4. Weightbearing as tolerated follow-up outpatient   Restless leg Syndrome:  Resume home dose requip.    Estimated body mass index is 21.4 kg/m as calculated from the following:   Height as of this encounter: 5\' 3"  (1.6 m).   Weight as of this encounter: 54.8 kg.     DVT prophylaxis: SCD Code Status: Full code Family Communication:  Disposition Plan:  Status is: Inpatient Remains inpatient appropriate because: Awaiting  insurance authorization for CIR             Subjective: Seen in HD, some dizziness.  No other complaints.   Examination:  General  exam: Appears calm and comfortable  Respiratory system: Clear to auscultation. Respiratory effort normal. Cardiovascular system: S1 & S2 heard, RRR. No JVD, murmurs, rubs, gallops or clicks. No pedal edema. Gastrointestinal system: Abdomen is nondistended, soft and nontender. No organomegaly or masses felt. Normal bowel sounds heard. Central nervous system: Alert and oriented. No focal neurological deficits. Extremities: Symmetric 5 x 5 power. Skin: No rashes, lesions or ulcers Psychiatry: Judgement and insight appear normal. Mood & affect appropriate.   Pressure Injury 10/21/22 Buttocks Right Stage 2 -  Partial thickness loss of dermis presenting as a shallow open injury with a red, pink wound bed without slough. (Active)  10/21/22 1145  Location: Buttocks  Location Orientation: Right  Staging: Stage 2 -  Partial thickness loss of dermis presenting as a shallow open injury with a red, pink wound bed without slough.  Wound Description (Comments):   Present on Admission: Yes     Pressure Injury 10/21/22 Buttocks Left Stage 1 -  Intact skin with non-blanchable redness of a localized area usually over a bony prominence. (Active)  10/21/22 1145  Location: Buttocks  Location Orientation: Left  Staging: Stage 1 -  Intact skin with non-blanchable redness of a localized area usually over a bony prominence.  Wound Description (Comments):   Present on Admission: Yes     Pressure Injury 10/21/22 Arm Lower;Posterior;Right Deep Tissue Pressure Injury - Purple or maroon localized area of discolored intact skin or blood-filled blister due to damage of underlying soft tissue from pressure and/or shear. (Active)  10/21/22 1230  Location: Arm  Location Orientation: Lower;Posterior;Right  Staging: Deep Tissue Pressure Injury - Purple or maroon localized area of discolored intact skin or blood-filled blister due to damage of underlying soft tissue from pressure and/or shear.  Wound Description (Comments):    Present on Admission: Yes     Diet Orders (From admission, onward)     Start     Ordered   10/27/22 1523  Diet regular Room service appropriate? Yes with Assist; Fluid consistency: Thin; Fluid restriction: 1500 mL Fluid  Diet effective now       Comments: Magic cup with meals./  Question Answer Comment  Room service appropriate? Yes with Assist   Fluid consistency: Thin   Fluid restriction: 1500 mL Fluid      10/27/22 1523            Objective: Vitals:   10/30/22 1130 10/30/22 1145 10/30/22 1215 10/30/22 1234  BP: 120/63 121/65 117/70 129/76  Pulse: 60 75 67 74  Resp: (!) 23 15 (!) 21 14  Temp:      TempSrc:      SpO2: 99% 99% 98% 99%  Weight:      Height:        Intake/Output Summary (Last 24 hours) at 10/30/2022 1253 Last data filed at 10/30/2022 1234 Gross per 24 hour  Intake --  Output 1550 ml  Net -1550 ml   Filed Weights   10/29/22 0501 10/30/22 0410 10/30/22 0824  Weight: 54.8 kg 57.2 kg 56.9 kg    Scheduled Meds:  acetaminophen  500 mg Oral TID   amiodarone  200 mg Oral BID   Followed by   Melene Muller ON 11/04/2022] amiodarone  200 mg Oral Daily   aspirin  81 mg Oral Daily  Chlorhexidine Gluconate Cloth  6 each Topical Daily   Chlorhexidine Gluconate Cloth  6 each Topical Q0600   Chlorhexidine Gluconate Cloth  6 each Topical Q0600   insulin aspart  0-6 Units Subcutaneous TID WC   lidocaine  1 patch Transdermal Q24H   lipase/protease/amylase  36,000 Units Oral BID WC   metoprolol succinate  25 mg Oral Daily   prednisoLONE acetate  1 drop Both Eyes BID   rOPINIRole  0.5 mg Oral QHS   sevelamer carbonate  2,400 mg Oral TID WC   valACYclovir  500 mg Oral QHS   Continuous Infusions:  Nutritional status     Body mass index is 22.22 kg/m.  Data Reviewed:   CBC: Recent Labs  Lab 10/24/22 1536 10/28/22 0906 10/30/22 0825  WBC 7.7 8.8 7.7  HGB 13.0 10.9* 10.5*  HCT 40.4 33.4* 32.3*  MCV 96.4 94.1 95.6  PLT 195 214 242   Basic Metabolic  Panel: Recent Labs  Lab 10/24/22 1536 10/26/22 0314 10/27/22 0550 10/28/22 0906 10/30/22 0825  NA 130* 131* 132* 130* 130*  K 3.8 3.4* 3.4* 3.5 3.5  CL 90* 91* 91* 93* 94*  CO2 23 24 25 23 23   GLUCOSE 145* 136* 123* 149* 172*  BUN 32* 19 30* 38* 31*  CREATININE 3.59* 2.72* 3.59* 4.23* 3.82*  CALCIUM 8.2* 7.8* 7.8* 7.6* 7.8*  MG  --   --   --   --  1.7  PHOS  --   --   --  6.5*  --    GFR: Estimated Creatinine Clearance: 8.9 mL/min (A) (by C-G formula based on SCr of 3.82 mg/dL (H)). Liver Function Tests: Recent Labs  Lab 10/28/22 0906  ALBUMIN 2.0*   No results for input(s): "LIPASE", "AMYLASE" in the last 168 hours. No results for input(s): "AMMONIA" in the last 168 hours. Coagulation Profile: No results for input(s): "INR", "PROTIME" in the last 168 hours. Cardiac Enzymes: No results for input(s): "CKTOTAL", "CKMB", "CKMBINDEX", "TROPONINI" in the last 168 hours. BNP (last 3 results) No results for input(s): "PROBNP" in the last 8760 hours. HbA1C: No results for input(s): "HGBA1C" in the last 72 hours. CBG: Recent Labs  Lab 10/29/22 0531 10/29/22 1121 10/29/22 1641 10/29/22 2042 10/30/22 0550  GLUCAP 123* 120* 116* 239* 73   Lipid Profile: No results for input(s): "CHOL", "HDL", "LDLCALC", "TRIG", "CHOLHDL", "LDLDIRECT" in the last 72 hours. Thyroid Function Tests: No results for input(s): "TSH", "T4TOTAL", "FREET4", "T3FREE", "THYROIDAB" in the last 72 hours. Anemia Panel: No results for input(s): "VITAMINB12", "FOLATE", "FERRITIN", "TIBC", "IRON", "RETICCTPCT" in the last 72 hours. Sepsis Labs: No results for input(s): "PROCALCITON", "LATICACIDVEN" in the last 168 hours.  Recent Results (from the past 240 hour(s))  MRSA Next Gen by PCR, Nasal     Status: None   Collection Time: 10/21/22 12:32 PM   Specimen: Nasal Mucosa; Nasal Swab  Result Value Ref Range Status   MRSA by PCR Next Gen NOT DETECTED NOT DETECTED Final    Comment: (NOTE) The GeneXpert  MRSA Assay (FDA approved for NASAL specimens only), is one component of a comprehensive MRSA colonization surveillance program. It is not intended to diagnose MRSA infection nor to guide or monitor treatment for MRSA infections. Test performance is not FDA approved in patients less than 55 years old. Performed at [facility], [address] [place], [place] [address]          Radiology Studies: No results found.  LOS: 9 days   Time spent= 35 mins    Miguel Rota, MD [facility]  If 7PM-7AM, please contact night-coverage  10/30/2022, 12:53 PM

## 2022-10-30 NOTE — H&P (Signed)
Physical Medicine and Rehabilitation Admission H&P        Chief Complaint  Patient presents with   Functional deficits due to TBI/pelvic Fx/      HPI:  Kim Burns is an 87 year old female with history of HTN, Renal artery embolization, Whipple (non malignant mass), A fib (no AC), ESRD-HD MWF, visual impairment and gait d/o w/falls, HOH who was admitted on 10/21/22 after being found face down on the  floor by son in law who went to pick her up for dialysis.  Work up in ED revealed acute croup contrecoup brain injury with nondisplaced left posterior skull fracture, right anterior frontal lobe hemorrhagic contusion and left posterior scalp hematoma and nondisplaced sacral fracture at S4.  Incidental findings of mildly progressive multilevel cervical spondylolysis, mild to advanced DDD lumbar spine, severe bilateral carotid atherosclerosis and mild wall thickening of distal sigmoid colon--correlate for signs/symptoms of colitis.  Dr. Dutch Quint recommended monitoring in the ICU with serial CT head, hold ASA for a week and avoid heparin with HD.    Serial CT head stable.  MRI brain showed stable size of contrecoup pulmonology contusion, localized edema without significant mass effect, 7 mm signal abnormality suspicious for small acute to early subacute ischemic nonhemorrhagic infarct and small bilateral subdural collections without significant change. Dr. Brock Bad felt that patient with acute ischemic stroke likely cardioembolic due to A-fib and recommended consideration for Watchman procedure as AC contraindicated. Dr. Servando Salina  consulted for input on A-fib with RVR as well as low blood pressure.  Amiodarone added and metoprolol held briefly then resumed at lower dose as BP stabilized.      Patient with left hip pain due to possible left inferior pubic rami fracture and sacral fracture.  Dr. Deberah Castle recommended weightbearing as tolerated as patient without numbness or tingling in LLE and to  follow-up on outpatient basis with X rays in 3 weeks.     Patient reports dizziness mostly when she moves her head.     Has cane/walker but furniture walks in home. Used walking stick when out to dialysis--also use WC/rollater prn.  She reports that her son and daughter can stay with her for assistance after discharge.   .  Review of Systems  Constitutional:  Negative for fever.  HENT:  Negative for congestion.   Eyes:  Positive for blurred vision.  Respiratory:  Negative for shortness of breath.   Cardiovascular:  Negative for chest pain.  Gastrointestinal:  Positive for diarrhea. Negative for abdominal pain, nausea and vomiting.       Chronic diarrhea/loose BMs  Genitourinary: Negative.   Musculoskeletal:  Positive for joint pain.  Skin:  Negative for rash.  Neurological:  Positive for dizziness and weakness.            Past Medical History:  Diagnosis Date   Anemia of chronic disease      takes iron   Anxiety     Blood transfusion without reported diagnosis     Bradycardia     Diabetes mellitus without complication (HCC)      became diabetic after Whipple procedure   Diarrhea     Dysrhythmia 04/16/2016    bradycardia due to medication    ESRD on hemodialysis [facility])      M-W-F   GERD (gastroesophageal reflux disease)     Gout     Headache     History of kidney stones 06/2013   Hyperparathyroidism (HCC)  Hypertension     PONV (postoperative nausea and vomiting)     Sleep apnea      no cpap machine. could not tolerate   Stroke (HCC) 04/16/2016    TIA 1995   Vitamin D deficiency                 Past Surgical History:  Procedure Laterality Date   A/V FISTULAGRAM Left 08/30/2021    Procedure: A/V Fistulagram;  Surgeon: Cephus Shelling, MD;  Location: [facility] INVASIVE CV LAB;  Service: Cardiovascular;  Laterality: Left;   ABDOMINAL HYSTERECTOMY   1985    complete   BACK SURGERY   1980    lower   BASCILIC VEIN TRANSPOSITION Left 04/24/2017    Procedure: LEFT  ARM FIRST STAGE BASILIC VEIN TRANSPOSITION;  Surgeon: Nada Libman, MD;  Location: [facility] OR;  Service: Vascular;  Laterality: Left;   BASCILIC VEIN TRANSPOSITION Left 07/10/2017    Procedure: SECOND STAGE BASILIC VEIN TRANSPOSITION LEFT ARM;  Surgeon: Nada Libman, MD;  Location: [facility] OR;  Service: Vascular;  Laterality: Left;   BREAST LUMPECTOMY Left x 2    many years apart, benign   CHOLECYSTECTOMY   1985   CYSTOSCOPY W/ URETERAL STENT PLACEMENT Left 10/31/2021    Procedure: CYSTOSCOPY WITH RETROGRADE PYELOGRAM/URETERAL STENT PLACEMENT;  Surgeon: Crist Fat, MD;  Location: [facility] OR;  Service: Urology;  Laterality: Left;   CYSTOSCOPY WITH URETEROSCOPY AND STENT PLACEMENT Left 06/18/2013    Procedure: CYSTOSCOPY WITH Lef URETEROSCOPY AND Left STENT PLACEMENT;  Surgeon: Crecencio Mc, MD;  Location: [facility] ORS;  Service: Urology;  Laterality: Left;   ESOPHAGOGASTRODUODENOSCOPY (EGD) WITH PROPOFOL N/A 11/13/2016    Procedure: ESOPHAGOGASTRODUODENOSCOPY (EGD) WITH PROPOFOL;  Surgeon: Kathi Der, MD;  Location: [facility] ENDOSCOPY;  Service: Gastroenterology;  Laterality: N/A;   EUS N/A 02/07/2016    Procedure: ESOPHAGEAL ENDOSCOPIC ULTRASOUND (EUS) RADIAL;  Surgeon: Willis Modena, MD;  Location: [facility] ENDOSCOPY;  Service: Endoscopy;  Laterality: N/A;   EYE SURGERY Bilateral 2014    ioc for catracts    FOOT SURGERY Left 1990    something with toes unsure what    HERNIA REPAIR   2018   IR CATHETER TUBE CHANGE   03/14/2022   IR EMBO TUMOR ORGAN ISCHEMIA INFARCT INC GUIDE ROADMAPPING   08/22/2021   IR NEPHROSTOMY EXCHANGE LEFT   06/27/2022   IR NEPHROSTOMY EXCHANGE LEFT   08/26/2022   IR NEPHROSTOMY EXCHANGE LEFT   10/22/2022   IR NEPHROSTOMY PLACEMENT LEFT   03/14/2022   IR RADIOLOGIST EVAL & MGMT   05/18/2021   IR RADIOLOGIST EVAL & MGMT   10/01/2021   IR RADIOLOGIST EVAL & MGMT   11/12/2021   IR RADIOLOGIST EVAL & MGMT   11/22/2021   IR RADIOLOGIST EVAL & MGMT   11/27/2021   IR RADIOLOGIST EVAL & MGMT    12/20/2021   IR RENAL SUPRASEL UNI S&I MOD SED   08/22/2021   IR US GUIDE VASC ACCESS RIGHT   08/22/2021   LEFT HEART CATH AND CORONARY ANGIOGRAPHY N/A 08/27/2021    Procedure: LEFT HEART CATH AND CORONARY ANGIOGRAPHY;  Surgeon: Kathleene Hazel, MD;  Location: [facility] INVASIVE CV LAB;  Service: Cardiovascular;  Laterality: N/A;   PARATHYROID EXPLORATION       PERIPHERAL VASCULAR BALLOON ANGIOPLASTY Left 08/30/2021    Procedure: PERIPHERAL VASCULAR BALLOON ANGIOPLASTY;  Surgeon: Cephus Shelling, MD;  Location: [facility] INVASIVE CV LAB;  Service: Cardiovascular;  Laterality: Left;  arm fistula  RADIOLOGY WITH ANESTHESIA Left 08/22/2021    Procedure: MICROWAVE ABLATION;  Surgeon: Simonne Come, MD;  Location: [facility] ORS;  Service: Anesthesiology;  Laterality: Left;   WHIPPLE PROCEDURE N/A 04/23/2016    Procedure: WHIPPLE PROCEDURE;  Surgeon: Almond Lint, MD;  Location: [facility] OR;  Service: General;  Laterality: N/A;             Family History  Problem Relation Age of Onset   Heart disease Mother     Stroke Mother     Cancer Father          colon   Alzheimer's disease Sister     Alzheimer's disease Sister     Cancer Brother          brain   Breast cancer Neg Hx            Social History:  reports that she has never smoked. She has never used smokeless tobacco. She reports that she does not drink alcohol and does not use drugs.     Allergies       Allergies  Allergen Reactions   Lopid [Gemfibrozil] Other (See Comments)      Myalgia    Lotemax [Loteprednol Etabonate] Rash      Skin burning  Blurry vision   Statins Other (See Comments)      Rosuvastatin Muscle weakness   Norco [Hydrocodone-Acetaminophen] Other (See Comments)      Headaches  Tolerates acetaminophen    Other Diarrhea      Real Butter - diarrhea   Vibra-Tab [Doxycycline] Other (See Comments)      Unknown reaction   Amlodipine Other (See Comments)      Norvasc   Codeine Other (See Comments)      headache   Hydrocodone  Other (See Comments)   Hydrocodone-Acetaminophen Other (See Comments)      Headache. Tolerates acetaminophen.   Lisinopril Other (See Comments)   Verapamil Other (See Comments)            Medications Prior to Admission  Medication Sig Dispense Refill   amiodarone (PACERONE) 200 MG tablet Take 1 tablet (200 mg total) by mouth daily. 90 tablet 3   B Complex-C-Zn-Folic Acid (DIALYVITE 800 WITH ZINC) 0.8 MG TABS Take 1 tablet by mouth daily.       colestipol (COLESTID) 1 g tablet Take 1 g by mouth See admin instructions. Take 1g (1 tablet) by mouth on MWF before dialysis in addition to 1g (1 tablet) as needed for diarrhea.       diclofenac Sodium (VOLTAREN) 1 % GEL Apply 2 g topically 3 (three) times daily as needed (pain). 100 g 0   diphenhydramine-acetaminophen (TYLENOL PM) 25-500 MG TABS tablet Take 2 tablets by mouth at bedtime.       folic acid (FOLVITE) 1 MG tablet Take 1 tablet (1 mg total) by mouth daily. (Patient taking differently: Take 800 mcg by mouth daily.)       insulin aspart (NOVOLOG) 100 UNIT/ML injection Inject 0-6 Units into the skin 3 (three) times daily with meals. (Patient taking differently: Inject 0-6 Units into the skin See admin instructions. Only if sugar is >200 for at least an hour) 10 mL 11   lidocaine-prilocaine (EMLA) cream Apply 1 Application topically every Monday, Wednesday, and Friday with hemodialysis.       lipase/protease/amylase (CREON) 36000 UNITS CPEP capsule Take 36,000-72,000 Units by mouth See admin instructions. Take 72,000 units (2 capsules) by mouth with meals and 36,000 units (1 capsule)  with snacks.       Menthol-Methyl Salicylate (MUSCLE RUB) 10-15 % CREA Apply 1 Application topically as needed for muscle pain.       metoprolol tartrate (LOPRESSOR) 50 MG tablet Take 1 tablet (50 mg total) by mouth 2 (two) times daily. 120 tablet 1   midodrine (PROAMATINE) 10 MG tablet Take 10 mg on dialysis days Mon-Wed-Fri       olopatadine (PATADAY) 0.1 %  ophthalmic solution Place 1 drop into both eyes 2 (two) times daily as needed for allergies.       rOPINIRole (REQUIP) 0.25 MG tablet Take 1 tablet (0.25 mg total) by mouth 2 (two) times daily. (Patient taking differently: Take 0.25-0.5 mg by mouth See admin instructions. Take 0.5mg  (1 tablet) by mouth every night at bedtime and 0.25mg  (1/2 tablet) in the mornings as needed.) 60 tablet 1   SENSIPAR 30 MG tablet Take 30 mg by mouth every Monday, Wednesday, and Friday.       sevelamer carbonate (RENVELA) 800 MG tablet Take 1,600-2,400 mg by mouth See admin instructions. Take 2,400mg  (3 tablets) by mouth with meals and 1,600mg  (2 tablets) with snacks       valACYclovir (VALTREX) 500 MG tablet Take 1 tablet (500 mg total) by mouth at bedtime. 14 tablet 0   Brinzolamide-Brimonidine 1-0.2 % SUSP Apply 1 drop to eye 2 (two) times daily. (Patient not taking: Reported on 10/22/2022)       erythromycin ophthalmic ointment Place into the left eye 2 (two) times daily. (Patient not taking: Reported on 10/22/2022) 3.5 g 0   prednisoLONE acetate (PRED FORTE) 1 % ophthalmic suspension Place 1 drop into the left eye 2 (two) times daily. (Patient not taking: Reported on 10/22/2022)       RESTASIS 0.05 % ophthalmic emulsion Place 1 drop into both eyes as needed (yes irratation). (Patient not taking: Reported on 10/22/2022)                  Home: Home Living Family/patient expects to be discharged to:: Private residence Living Arrangements: Children (daughter assists in "summertime" son assists when daughter is not present,son works from home) Available Help at Discharge: Family, Available 24 hours/day Type of Home: House Home Access: Stairs to enter [employer] of Steps: 4 to get in the house Entrance Stairs-Rails: Left, Right Home Layout: Able to live on main level with bedroom/bathroom, Laundry or work area in basement Alternate Teacher, music of Steps: ~13 Alternate Level Stairs-Rails:  Left Bathroom Shower/Tub: Health visitor: Handicapped height Bathroom Accessibility: Yes Home Equipment: Agricultural consultant (2 wheels), Information systems manager, Rollator (4 wheels), Wheelchair - manual, Cane - single point Additional Comments: Pt appears to be an unreliable reporter. During this session, pt reports that son and daughter stay with her 24/7, that son and daughter are taking turns staying with pt, that daughter is now teaching in [place], that no one was home when she fell, that her daughter was downstairs and she was upstairs when she fell, and that she does not go upstairs. Per chart, family found pt asleep on the floor when son-in-law arrived to take pt to HD.  Lives With: Shari Heritage (daughter stays as well when she's in town)   Functional History: Prior Function Prior Level of Function : Independent/Modified Independent Mobility Comments: has a rollator but has not used yet, does not use AD at home ADLs Comments: Pt reports Supervision for ADLs and IADLs from son and daughter.   Functional Status:  Mobility: Bed  Mobility Overal bed mobility: Needs Assistance Bed Mobility: Sidelying to Sit Rolling: Min assist, Used rails Sidelying to sit: Mod assist Supine to sit: Min assist Sit to supine: Mod assist General bed mobility comments: Min A to elevate LLE for pt to advance and for trunk elevation Transfers Overall transfer level: Needs assistance Equipment used: Rolling walker (2 wheels) Transfers: Sit to/from Stand Sit to Stand: Contact guard assist General transfer comment: deferred this session for safety Ambulation/Gait Ambulation/Gait assistance: Min assist Gait Distance (Feet): 5 Feet (fwd then back) Assistive device: Rolling walker (2 wheels) Gait Pattern/deviations: Step-to pattern, Shuffle, Trunk flexed, Antalgic, Decreased stance time - left General Gait Details: unable due to dizziness Gait velocity interpretation: <1.31 ft/sec, indicative of household  ambulator Pre-gait activities: marching at EOB   ADL: ADL Overall ADL's : Needs assistance/impaired Eating/Feeding: Set up Eating/Feeding Details (indicate cue type and reason): eating lunch in bed/chair position Grooming: Wash/dry face, Sitting Upper Body Bathing: Moderate assistance, Sitting Lower Body Bathing: Moderate assistance, Sitting/lateral leans Upper Body Dressing : Moderate assistance, Sitting Lower Body Dressing: Moderate assistance, Sitting/lateral leans Toilet Transfer: Moderate assistance, Squat-pivot, Stand-pivot, BSC/3in1 Toileting- Clothing Manipulation and Hygiene: Moderate assistance Functional mobility during ADLs: Moderate assistance, Rolling walker (2 wheels) General ADL Comments: ADLs assessed from bed level secondary to pt declining sitting EOB or in recliner secondary to pt reporting concern over A Fib and pain in Left thigh. OT educated pt in importance of OOB activity and in RN giving pt the OK to participate in OT session with pt continuing to decline.   Cognition: Cognition Overall Cognitive Status: Impaired/Different from baseline Arousal/Alertness: Lethargic Orientation Level: Oriented X4 Year: 2024 Month: September Day of Week: Correct Attention: Sustained Sustained Attention: Appears intact Memory: Impaired Memory Impairment: Decreased recall of new information Awareness: Appears intact Safety/Judgment: Appears intact Cognition Arousal: Alert Behavior During Therapy: WFL for tasks assessed/performed Overall Cognitive Status: Impaired/Different from baseline Area of Impairment: Problem solving, Attention Current Attention Level: Selective Problem Solving: Slow processing General Comments: some slow processing, but follows simple commands consistently   Physical Exam: Blood pressure 132/69, pulse 66, temperature 97.9 F (36.6 C), temperature source Oral, resp. rate 20, height 5\' 3"  (1.6 m), weight 55.4 kg, SpO2 100%. Physical Exam      General: No apparent distress, frail-appearing HEENT: Head is normocephalic, small hematoma on posterior scalp, bruising on posterior neck and left face, PERRLA, EOMI, sclera anicteric, oral mucosa pink and muist.  She is hard of hearing, dentition intact, ext ear canals clear, wearing glasses Neck: Supple without JVD or lymphadenopathy Heart: Reg rate and rhythm. No murmurs rubs or gallops Chest: CTA bilaterally without wheezes, rales, or rhonchi; no distress Abdomen: Soft, non-tender, mildly-distended, bowel sounds positive. Extremities: Trace lower extremity edema Left upper extremity AVF with bandage Psych: Pt's affect is appropriate. Pt is cooperative Skin: Bruising over head and neck, right dorsal wrist, border foam dressing on both arms Neuro: Alert and oriented x 4, follows commands, fair insight and awareness.  No dysphagia or dysarthria noted.  Cranial nerves II through XII intact.  Able to name and repeat.  Able to remember 3 out of 3 words 5 minutes later-she reports this was difficult Right upper extremity strength 4 out of 5 Left upper extremity strength 4 out of 5 Right lower extremity strength 3 out of 5 hip flexion, 4 out of 5 knee extension, 4 out of 5 ankle PF and DF Left lower extremity strength 1 out of 5 hip flexion, 4- out of 5  knee extension, 4 out of 5 ankle PF and DF Sensory exam normal for light touch and pain in all 4 limbs. No limb ataxia or cerebellar signs. No abnormal tone appreciated.    Musculoskeletal: Tenderness left hip.  Left hip pain with PROM. Left lower chest wall tenderness to palpation   Lab Results Last 48 Hours        Results for orders placed or performed during the hospital encounter of 10/21/22 (from the past 48 hour(s))  Glucose, capillary     Status: None    Collection Time: 10/28/22  7:05 PM  Result Value Ref Range    Glucose-Capillary 89 70 - 99 mg/dL      Comment: Glucose reference range applies only to samples taken after fasting for  at least 8 hours.  Glucose, capillary     Status: Abnormal    Collection Time: 10/28/22  8:47 PM  Result Value Ref Range    Glucose-Capillary 126 (H) 70 - 99 mg/dL      Comment: Glucose reference range applies only to samples taken after fasting for at least 8 hours.  Glucose, capillary     Status: Abnormal    Collection Time: 10/29/22  5:31 AM  Result Value Ref Range    Glucose-Capillary 123 (H) 70 - 99 mg/dL      Comment: Glucose reference range applies only to samples taken after fasting for at least 8 hours.  Glucose, capillary     Status: Abnormal    Collection Time: 10/29/22 11:21 AM  Result Value Ref Range    Glucose-Capillary 120 (H) 70 - 99 mg/dL      Comment: Glucose reference range applies only to samples taken after fasting for at least 8 hours.  Glucose, capillary     Status: Abnormal    Collection Time: 10/29/22  4:41 PM  Result Value Ref Range    Glucose-Capillary 116 (H) 70 - 99 mg/dL      Comment: Glucose reference range applies only to samples taken after fasting for at least 8 hours.  Glucose, capillary     Status: Abnormal    Collection Time: 10/29/22  8:42 PM  Result Value Ref Range    Glucose-Capillary 239 (H) 70 - 99 mg/dL      Comment: Glucose reference range applies only to samples taken after fasting for at least 8 hours.  Glucose, capillary     Status: None    Collection Time: 10/30/22  5:50 AM  Result Value Ref Range    Glucose-Capillary 73 70 - 99 mg/dL      Comment: Glucose reference range applies only to samples taken after fasting for at least 8 hours.  Basic metabolic panel     Status: Abnormal    Collection Time: 10/30/22  8:25 AM  Result Value Ref Range    Sodium 130 (L) 135 - 145 mmol/L    Potassium 3.5 3.5 - 5.1 mmol/L    Chloride 94 (L) 98 - 111 mmol/L    CO2 23 22 - 32 mmol/L    Glucose, Bld 172 (H) 70 - 99 mg/dL      Comment: Glucose reference range applies only to samples taken after fasting for at least 8 hours.    BUN 31 (H) 8 - 23  mg/dL    Creatinine, Ser 6.60 (H) 0.44 - 1.00 mg/dL    Calcium 7.8 (L) 8.9 - 10.3 mg/dL    GFR, Estimated 11 (L) >60 mL/min      Comment: (  NOTE) Calculated using the CKD-EPI Creatinine Equation (2021)      Anion gap 13 5 - 15      Comment: Performed at [facility], [address], [place], [place] [address]  CBC     Status: Abnormal    Collection Time: 10/30/22  8:25 AM  Result Value Ref Range    WBC 7.7 4.0 - 10.5 K/uL    RBC 3.38 (L) 3.87 - 5.11 MIL/uL    Hemoglobin 10.5 (L) 12.0 - 15.0 g/dL    HCT 60.4 (L) 54.0 - 46.0 %    MCV 95.6 80.0 - 100.0 fL    MCH 31.1 26.0 - 34.0 pg    MCHC 32.5 30.0 - 36.0 g/dL    RDW 98.1 (H) 19.1 - 15.5 %    Platelets 242 150 - 400 K/uL    nRBC 0.0 0.0 - 0.2 %      Comment: Performed at [facility], [address], [place], [place] [address]  Magnesium     Status: None    Collection Time: 10/30/22  8:25 AM  Result Value Ref Range    Magnesium 1.7 1.7 - 2.4 mg/dL      Comment: Performed at [facility], [address] [place], [place] [address]  Glucose, capillary     Status: None    Collection Time: 10/30/22  2:01 PM  Result Value Ref Range    Glucose-Capillary 80 70 - 99 mg/dL      Comment: Glucose reference range applies only to samples taken after fasting for at least 8 hours.      Imaging Results (Last 48 hours)  No results found.         Blood pressure 132/69, pulse 66, temperature 97.9 F (36.6 C), temperature source Oral, resp. rate 20, height 5\' 3"  (1.6 m), weight 55.4 kg, SpO2 100%.   Medical Problem List and Plan: 1. Functional deficits secondary to TBI and polytrauma due to a fall with associated Croop contrecoup brain injury, right frontal lobe hemorrhagic contusion and right frontal ischemic nonhemorrhagic infarct.             -patient may shower             -ELOS/Goals: 7-10 days, PT/OT/SLP mod I to supervision             -Admit to CIR 2.  Antithrombotics: -DVT/anticoagulation:  Mechanical: Sequential  compression devices, below knee Bilateral lower extremities             -antiplatelet therapy: ASA 3. Pain Management: Tylenol prn.  4. Mood/Behavior/Sleep: LCSW to follow for evaluation and support.              -antipsychotic agents: N/A             --chronic insomnia. Naps on and off throughout the day. 5. Neuropsych/cognition: This patient is not fully capable of making decisions on her own behalf. 6. Skin/Wound Care: Routine pressure relief measures.  7. Fluids/Electrolytes/Nutrition: Strict I/O. Change to renal diet. Continue 1500 cc FR.  8. A fib w/RVR/Acute systolic CHF: Fluid overload due to A fib and managed with HD.              --Duo nebs prn added today for SOB? 1500 cc FR/monitor weights daily.              --monitor HR TID--on amiodarone taper. Continue metoprolol  9. HTN: Monitor BP TID--on metoprolol bid.  Monitor with  mobility 10. ESRD: HD MWF at the end of the day to help with tolerance of therapy             --hx of intradialysis hypotension (midodrine in the past) 11. Pancreatic insufficiency s/p Whipple: Continue Creon with meals  BID ac             --has dumping syndrome (abdominal pain with meals followed by diarrhea at times) 12. Left pubic rami/Sacral Ala at S4: WBAT per Dr. Thad Ranger. Follow up Xrays in 2 weeks. 13. L-renal mass (likely RCC) urine leak s/p chronic nephrostomy tube: Tube exchanged out on 09/03 by IR per protocol             --no on AC due to bleeding from nephrostomy tube/kidney.  Measure output daily.   14. T2DM diet controlled w/peripheral neuropathy: Monitor BS ac/hs and use SSI for elevated BS.  --Hgb A1C- 6.1. will add CM restrictions.  15. Dizziness: question vertigo --will order vestibular eval             --also check orthostatic vitals.  Continue meclizine 16. Anemia of CKD: Monitor with serial checks. H/H stable in 10 range.  17. H/o Shingles w/optic nerve involvement: On Valtrex.  18. Restless leg syndrome: On Requip 0.5 mg HS with  additional 0.25 mg prn during the day.        Jacquelynn Cree, PA-C 10/30/2022     I have personally performed a face to face diagnostic evaluation of this patient and formulated the key components of the plan.  Additionally, I have personally reviewed laboratory data, imaging studies, as well as relevant notes and concur with the physician assistant's documentation above.   The patient's status has not changed from the original H&P.  Any changes in documentation from the acute care chart have been noted above.   Fanny Dance, MD, [place]

## 2022-10-30 NOTE — H&P (Signed)
Physical Medicine and Rehabilitation Admission H&P    Chief Complaint  Patient presents with   Functional deficits due to TBI/pelvic Fx/    HPI:  Kim Burns is an 87 year old female with history of HTN, Renal artery embolization, Whipple (non malignant mass), A fib (no AC), ESRD-HD MWF, visual impairment and gait d/o w/falls, HOH who was admitted on 10/21/22 after being found face down on the  floor by son in law who went to pick her up for dialysis.  Work up in ED revealed acute croup contrecoup brain injury with nondisplaced left posterior skull fracture, right anterior frontal lobe hemorrhagic contusion and left posterior scalp hematoma and nondisplaced sacral fracture at S4.  Incidental findings of mildly progressive multilevel cervical spondylolysis, mild to advanced DDD lumbar spine, severe bilateral carotid atherosclerosis and mild wall thickening of distal sigmoid colon--correlate for signs/symptoms of colitis.  Dr. Dutch Quint recommended monitoring in the ICU with serial CT head, hold ASA for a week and avoid heparin with HD.   Serial CT head stable.  MRI brain showed stable size of contrecoup pulmonology contusion, localized edema without significant mass effect, 7 mm signal abnormality suspicious for small acute to early subacute ischemic nonhemorrhagic infarct and small bilateral subdural collections without significant change. Dr. Brock Bad felt that patient with acute ischemic stroke likely cardioembolic due to A-fib and recommended consideration for Watchman procedure as AC contraindicated. Dr. Servando Salina  consulted for input on A-fib with RVR as well as low blood pressure.  Amiodarone added and metoprolol held briefly then resumed at lower dose as BP stabilized.     Patient with left hip pain due to possible left inferior pubic rami fracture and sacral fracture.  Dr. Deberah Castle recommended weightbearing as tolerated as patient without numbness or tingling in LLE and to follow-up  on outpatient basis with X rays in 3 weeks.    Patient reports dizziness mostly when she moves her head.   Has cane/walker but furniture walks in home. Used walking stick when out to dialysis--also use WC/rollater prn.  She reports that her son and daughter can stay with her for assistance after discharge.   .  Review of Systems  Constitutional:  Negative for fever.  HENT:  Negative for congestion.   Eyes:  Positive for blurred vision.  Respiratory:  Negative for shortness of breath.   Cardiovascular:  Negative for chest pain.  Gastrointestinal:  Positive for diarrhea. Negative for abdominal pain, nausea and vomiting.       Chronic diarrhea/loose BMs  Genitourinary: Negative.   Musculoskeletal:  Positive for joint pain.  Skin:  Negative for rash.  Neurological:  Positive for dizziness and weakness.     Past Medical History:  Diagnosis Date   Anemia of chronic disease    takes iron   Anxiety    Blood transfusion without reported diagnosis    Bradycardia    Diabetes mellitus without complication (HCC)    became diabetic after Whipple procedure   Diarrhea    Dysrhythmia 04/16/2016   bradycardia due to medication    ESRD on hemodialysis [facility])    M-W-F   GERD (gastroesophageal reflux disease)    Gout    Headache    History of kidney stones 06/2013   Hyperparathyroidism (HCC)    Hypertension    PONV (postoperative nausea and vomiting)    Sleep apnea    no cpap machine. could not tolerate   Stroke (HCC) 04/16/2016   TIA 1995  Vitamin D deficiency     Past Surgical History:  Procedure Laterality Date   A/V FISTULAGRAM Left 08/30/2021   Procedure: A/V Fistulagram;  Surgeon: Cephus Shelling, MD;  Location: [facility] INVASIVE CV LAB;  Service: Cardiovascular;  Laterality: Left;   ABDOMINAL HYSTERECTOMY  1985   complete   BACK SURGERY  1980   lower   BASCILIC VEIN TRANSPOSITION Left 04/24/2017   Procedure: LEFT ARM FIRST STAGE BASILIC VEIN TRANSPOSITION;  Surgeon:  Nada Libman, MD;  Location: [facility] OR;  Service: Vascular;  Laterality: Left;   BASCILIC VEIN TRANSPOSITION Left 07/10/2017   Procedure: SECOND STAGE BASILIC VEIN TRANSPOSITION LEFT ARM;  Surgeon: Nada Libman, MD;  Location: [facility] OR;  Service: Vascular;  Laterality: Left;   BREAST LUMPECTOMY Left x 2   many years apart, benign   CHOLECYSTECTOMY  1985   CYSTOSCOPY W/ URETERAL STENT PLACEMENT Left 10/31/2021   Procedure: CYSTOSCOPY WITH RETROGRADE PYELOGRAM/URETERAL STENT PLACEMENT;  Surgeon: Crist Fat, MD;  Location: [facility] OR;  Service: Urology;  Laterality: Left;   CYSTOSCOPY WITH URETEROSCOPY AND STENT PLACEMENT Left 06/18/2013   Procedure: CYSTOSCOPY WITH Lef URETEROSCOPY AND Left STENT PLACEMENT;  Surgeon: Crecencio Mc, MD;  Location: [facility] ORS;  Service: Urology;  Laterality: Left;   ESOPHAGOGASTRODUODENOSCOPY (EGD) WITH PROPOFOL N/A 11/13/2016   Procedure: ESOPHAGOGASTRODUODENOSCOPY (EGD) WITH PROPOFOL;  Surgeon: Kathi Der, MD;  Location: [facility] ENDOSCOPY;  Service: Gastroenterology;  Laterality: N/A;   EUS N/A 02/07/2016   Procedure: ESOPHAGEAL ENDOSCOPIC ULTRASOUND (EUS) RADIAL;  Surgeon: Willis Modena, MD;  Location: [facility] ENDOSCOPY;  Service: Endoscopy;  Laterality: N/A;   EYE SURGERY Bilateral 2014   ioc for catracts    FOOT SURGERY Left 1990   something with toes unsure what    HERNIA REPAIR  2018   IR CATHETER TUBE CHANGE  03/14/2022   IR EMBO TUMOR ORGAN ISCHEMIA INFARCT INC GUIDE ROADMAPPING  08/22/2021   IR NEPHROSTOMY EXCHANGE LEFT  06/27/2022   IR NEPHROSTOMY EXCHANGE LEFT  08/26/2022   IR NEPHROSTOMY EXCHANGE LEFT  10/22/2022   IR NEPHROSTOMY PLACEMENT LEFT  03/14/2022   IR RADIOLOGIST EVAL & MGMT  05/18/2021   IR RADIOLOGIST EVAL & MGMT  10/01/2021   IR RADIOLOGIST EVAL & MGMT  11/12/2021   IR RADIOLOGIST EVAL & MGMT  11/22/2021   IR RADIOLOGIST EVAL & MGMT  11/27/2021   IR RADIOLOGIST EVAL & MGMT  12/20/2021   IR RENAL SUPRASEL UNI S&I MOD SED  08/22/2021   IR US GUIDE VASC  ACCESS RIGHT  08/22/2021   LEFT HEART CATH AND CORONARY ANGIOGRAPHY N/A 08/27/2021   Procedure: LEFT HEART CATH AND CORONARY ANGIOGRAPHY;  Surgeon: Kathleene Hazel, MD;  [facility]: [facility] INVASIVE CV LAB;  Service: Cardiovascular;  Laterality: N/A;   PARATHYROID EXPLORATION     PERIPHERAL VASCULAR BALLOON ANGIOPLASTY Left 08/30/2021   Procedure: PERIPHERAL VASCULAR BALLOON ANGIOPLASTY;  Surgeon: Cephus Shelling, MD;  Location: [facility] INVASIVE CV LAB;  Service: Cardiovascular;  Laterality: Left;  arm fistula   RADIOLOGY WITH ANESTHESIA Left 08/22/2021   Procedure: MICROWAVE ABLATION;  Surgeon: Simonne Come, MD;  Location: [facility] ORS;  Service: Anesthesiology;  Laterality: Left;   WHIPPLE PROCEDURE N/A 04/23/2016   Procedure: WHIPPLE PROCEDURE;  Surgeon: Almond Lint, MD;  Location: [facility] OR;  Service: General;  Laterality: N/A;   Family History  Problem Relation Age of Onset   Heart disease Mother    Stroke Mother    Cancer Father        colon  Alzheimer's disease Sister    Alzheimer's disease Sister    Cancer Brother        brain   Breast cancer Neg Hx     Social History:  reports that she has never smoked. She has never used smokeless tobacco. She reports that she does not drink alcohol and does not use drugs.   Allergies  Allergen Reactions   Lopid [Gemfibrozil] Other (See Comments)    Myalgia    Lotemax [Loteprednol Etabonate] Rash    Skin burning  Blurry vision   Statins Other (See Comments)    Rosuvastatin Muscle weakness   Norco [Hydrocodone-Acetaminophen] Other (See Comments)    Headaches  Tolerates acetaminophen    Other Diarrhea    Real Butter - diarrhea   Vibra-Tab [Doxycycline] Other (See Comments)    Unknown reaction   Amlodipine Other (See Comments)    Norvasc   Codeine Other (See Comments)    headache   Hydrocodone Other (See Comments)   Hydrocodone-Acetaminophen Other (See Comments)    Headache. Tolerates acetaminophen.   Lisinopril Other (See Comments)    Verapamil Other (See Comments)   Medications Prior to Admission  Medication Sig Dispense Refill   amiodarone (PACERONE) 200 MG tablet Take 1 tablet (200 mg total) by mouth daily. 90 tablet 3   B Complex-C-Zn-Folic Acid (DIALYVITE 800 WITH ZINC) 0.8 MG TABS Take 1 tablet by mouth daily.     colestipol (COLESTID) 1 g tablet Take 1 g by mouth See admin instructions. Take 1g (1 tablet) by mouth on MWF before dialysis in addition to 1g (1 tablet) as needed for diarrhea.     diclofenac Sodium (VOLTAREN) 1 % GEL Apply 2 g topically 3 (three) times daily as needed (pain). 100 g 0   diphenhydramine-acetaminophen (TYLENOL PM) 25-500 MG TABS tablet Take 2 tablets by mouth at bedtime.     folic acid (FOLVITE) 1 MG tablet Take 1 tablet (1 mg total) by mouth daily. (Patient taking differently: Take 800 mcg by mouth daily.)     insulin aspart (NOVOLOG) 100 UNIT/ML injection Inject 0-6 Units into the skin 3 (three) times daily with meals. (Patient taking differently: Inject 0-6 Units into the skin See admin instructions. Only if sugar is >200 for at least an hour) 10 mL 11   lidocaine-prilocaine (EMLA) cream Apply 1 Application topically every Monday, Wednesday, and Friday with hemodialysis.     lipase/protease/amylase (CREON) 36000 UNITS CPEP capsule Take 36,000-72,000 Units by mouth See admin instructions. Take 72,000 units (2 capsules) by mouth with meals and 36,000 units (1 capsule) with snacks.     Menthol-Methyl Salicylate (MUSCLE RUB) 10-15 % CREA Apply 1 Application topically as needed for muscle pain.     metoprolol tartrate (LOPRESSOR) 50 MG tablet Take 1 tablet (50 mg total) by mouth 2 (two) times daily. 120 tablet 1   midodrine (PROAMATINE) 10 MG tablet Take 10 mg on dialysis days Mon-Wed-Fri     olopatadine (PATADAY) 0.1 % ophthalmic solution Place 1 drop into both eyes 2 (two) times daily as needed for allergies.     rOPINIRole (REQUIP) 0.25 MG tablet Take 1 tablet (0.25 mg total) by mouth 2 (two)  times daily. (Patient taking differently: Take 0.25-0.5 mg by mouth See admin instructions. Take 0.5mg  (1 tablet) by mouth every night at bedtime and 0.25mg  (1/2 tablet) in the mornings as needed.) 60 tablet 1   SENSIPAR 30 MG tablet Take 30 mg by mouth every Monday, Wednesday, and Friday.  sevelamer carbonate (RENVELA) 800 MG tablet Take 1,600-2,400 mg by mouth See admin instructions. Take 2,400mg  (3 tablets) by mouth with meals and 1,600mg  (2 tablets) with snacks     valACYclovir (VALTREX) 500 MG tablet Take 1 tablet (500 mg total) by mouth at bedtime. 14 tablet 0   Brinzolamide-Brimonidine 1-0.2 % SUSP Apply 1 drop to eye 2 (two) times daily. (Patient not taking: Reported on 10/22/2022)     erythromycin ophthalmic ointment Place into the left eye 2 (two) times daily. (Patient not taking: Reported on 10/22/2022) 3.5 g 0   prednisoLONE acetate (PRED FORTE) 1 % ophthalmic suspension Place 1 drop into the left eye 2 (two) times daily. (Patient not taking: Reported on 10/22/2022)     RESTASIS 0.05 % ophthalmic emulsion Place 1 drop into both eyes as needed (yes irratation). (Patient not taking: Reported on 10/22/2022)        Home: Home Living Family/patient expects to be discharged to:: Private residence Living Arrangements: Children (daughter assists in "summertime" son assists when daughter is not present,son works from home) Available Help at Discharge: Family, Available 24 hours/day Type of Home: House Home Access: Stairs to enter [employer] of Steps: 4 to get in the house Entrance Stairs-Rails: Left, Right Home Layout: Able to live on main level with bedroom/bathroom, Laundry or work area in basement Alternate Teacher, music of Steps: ~13 Alternate Level Stairs-Rails: Left Bathroom Shower/Tub: Health visitor: Handicapped height Bathroom Accessibility: Yes Home Equipment: Agricultural consultant (2 wheels), Information systems manager, Rollator (4 wheels), Wheelchair - manual, Cane -  single point Additional Comments: Pt appears to be an unreliable reporter. During this session, pt reports that son and daughter stay with her 24/7, that son and daughter are taking turns staying with pt, that daughter is now teaching in [place], that no one was home when she fell, that her daughter was downstairs and she was upstairs when she fell, and that she does not go upstairs. Per chart, family found pt asleep on the floor when son-in-law arrived to take pt to HD.  Lives With: Shari Heritage (daughter stays as well when she's in town)   Functional History: Prior Function Prior Level of Function : Independent/Modified Independent Mobility Comments: has a rollator but has not used yet, does not use AD at home ADLs Comments: Pt reports Supervision for ADLs and IADLs from son and daughter.  Functional Status:  Mobility: Bed Mobility Overal bed mobility: Needs Assistance Bed Mobility: Sidelying to Sit Rolling: Min assist, Used rails Sidelying to sit: Mod assist Supine to sit: Min assist Sit to supine: Mod assist General bed mobility comments: Min A to elevate LLE for pt to advance and for trunk elevation Transfers Overall transfer level: Needs assistance Equipment used: Rolling walker (2 wheels) Transfers: Sit to/from Stand Sit to Stand: Contact guard assist General transfer comment: deferred this session for safety Ambulation/Gait Ambulation/Gait assistance: Min assist Gait Distance (Feet): 5 Feet (fwd then back) Assistive device: Rolling walker (2 wheels) Gait Pattern/deviations: Step-to pattern, Shuffle, Trunk flexed, Antalgic, Decreased stance time - left General Gait Details: unable due to dizziness Gait velocity interpretation: <1.31 ft/sec, indicative of household ambulator Pre-gait activities: marching at EOB    ADL: ADL Overall ADL's : Needs assistance/impaired Eating/Feeding: Set up Eating/Feeding Details (indicate cue type and reason): eating lunch in bed/chair  position Grooming: Wash/dry face, Sitting Upper Body Bathing: Moderate assistance, Sitting Lower Body Bathing: Moderate assistance, Sitting/lateral leans Upper Body Dressing : Moderate assistance, Sitting Lower Body Dressing: Moderate assistance, Sitting/lateral  leans Toilet Transfer: Moderate assistance, Squat-pivot, Stand-pivot, BSC/3in1 Toileting- Clothing Manipulation and Hygiene: Moderate assistance Functional mobility during ADLs: Moderate assistance, Rolling walker (2 wheels) General ADL Comments: ADLs assessed from bed level secondary to pt declining sitting EOB or in recliner secondary to pt reporting concern over A Fib and pain in Left thigh. OT educated pt in importance of OOB activity and in RN giving pt the OK to participate in OT session with pt continuing to decline.  Cognition: Cognition Overall Cognitive Status: Impaired/Different from baseline Arousal/Alertness: Lethargic Orientation Level: Oriented X4 Year: 2024 Month: September Day of Week: Correct Attention: Sustained Sustained Attention: Appears intact Memory: Impaired Memory Impairment: Decreased recall of new information Awareness: Appears intact Safety/Judgment: Appears intact Cognition Arousal: Alert Behavior During Therapy: WFL for tasks assessed/performed Overall Cognitive Status: Impaired/Different from baseline Area of Impairment: Problem solving, Attention Current Attention Level: Selective Problem Solving: Slow processing General Comments: some slow processing, but follows simple commands consistently  Physical Exam: Blood pressure 132/69, pulse 66, temperature 97.9 F (36.6 C), temperature source Oral, resp. rate 20, height 5\' 3"  (1.6 m), weight 55.4 kg, SpO2 100%. Physical Exam   General: No apparent distress, frail-appearing HEENT: Head is normocephalic, small hematoma on posterior scalp, bruising on posterior neck and left face, PERRLA, EOMI, sclera anicteric, oral mucosa pink and muist.   She is hard of hearing dentition intact, ext ear canals clear, wearing glasses Neck: Supple without JVD or lymphadenopathy Heart: Reg rate and rhythm. No murmurs rubs or gallops Chest: CTA bilaterally without wheezes, rales, or rhonchi; no distress Abdomen: Soft, non-tender, mildly-distended, bowel sounds positive. Extremities: Trace lower extremity edema Left upper extremity AVF with bandage Psych: Pt's affect is appropriate. Pt is cooperative Skin: Bruising over head and neck, right dorsal wrist, border foam dressing on both arms Neuro: Alert and oriented x 4, follows commands, fair insight and awareness.  No dysphagia or dysarthria noted.  Cranial nerves II through XII intact.  Able to name and repeat.  Able to remember 3 out of 3 words 5 minutes later-she reports this was difficult Right upper extremity strength 4 out of 5 Left upper extremity strength 4 out of 5 Right lower extremity strength 3 out of 5 hip flexion, 4 out of 5 knee extension, 4 out of 5 ankle PF and DF Left lower extremity strength 1 out of 5 hip flexion, 4- out of 5 knee extension, 4 out of 5 ankle PF and DF Sensory exam normal for light touch and pain in all 4 limbs. No limb ataxia or cerebellar signs. No abnormal tone appreciated.    Musculoskeletal: Tenderness left hip.  Left hip pain with PROM. Left lower chest wall tenderness to palpation  Results for orders placed or performed during the hospital encounter of 10/21/22 (from the past 48 hour(s))  Glucose, capillary     Status: None   Collection Time: 10/28/22  7:05 PM  Result Value Ref Range   Glucose-Capillary 89 70 - 99 mg/dL    Comment: Glucose reference range applies only to samples taken after fasting for at least 8 hours.  Glucose, capillary     Status: Abnormal   Collection Time: 10/28/22  8:47 PM  Result Value Ref Range   Glucose-Capillary 126 (H) 70 - 99 mg/dL    Comment: Glucose reference range applies only to samples taken after fasting for at least  8 hours.  Glucose, capillary     Status: Abnormal   Collection Time: 10/29/22  5:31 AM  Result Value  Ref Range   Glucose-Capillary 123 (H) 70 - 99 mg/dL    Comment: Glucose reference range applies only to samples taken after fasting for at least 8 hours.  Glucose, capillary     Status: Abnormal   Collection Time: 10/29/22 11:21 AM  Result Value Ref Range   Glucose-Capillary 120 (H) 70 - 99 mg/dL    Comment: Glucose reference range applies only to samples taken after fasting for at least 8 hours.  Glucose, capillary     Status: Abnormal   Collection Time: 10/29/22  4:41 PM  Result Value Ref Range   Glucose-Capillary 116 (H) 70 - 99 mg/dL    Comment: Glucose reference range applies only to samples taken after fasting for at least 8 hours.  Glucose, capillary     Status: Abnormal   Collection Time: 10/29/22  8:42 PM  Result Value Ref Range   Glucose-Capillary 239 (H) 70 - 99 mg/dL    Comment: Glucose reference range applies only to samples taken after fasting for at least 8 hours.  Glucose, capillary     Status: None   Collection Time: 10/30/22  5:50 AM  Result Value Ref Range   Glucose-Capillary 73 70 - 99 mg/dL    Comment: Glucose reference range applies only to samples taken after fasting for at least 8 hours.  Basic metabolic panel     Status: Abnormal   Collection Time: 10/30/22  8:25 AM  Result Value Ref Range   Sodium 130 (L) 135 - 145 mmol/L   Potassium 3.5 3.5 - 5.1 mmol/L   Chloride 94 (L) 98 - 111 mmol/L   CO2 23 22 - 32 mmol/L   Glucose, Bld 172 (H) 70 - 99 mg/dL    Comment: Glucose reference range applies only to samples taken after fasting for at least 8 hours.   BUN 31 (H) 8 - 23 mg/dL   Creatinine, Ser 1.61 (H) 0.44 - 1.00 mg/dL   Calcium 7.8 (L) 8.9 - 10.3 mg/dL   GFR, Estimated 11 (L) >60 mL/min    Comment: (NOTE) Calculated using the CKD-EPI Creatinine Equation (2021)    Anion gap 13 5 - 15    Comment: Performed at [facility], [address],  [place], [place] [address]  CBC     Status: Abnormal   Collection Time: 10/30/22  8:25 AM  Result Value Ref Range   WBC 7.7 4.0 - 10.5 K/uL   RBC 3.38 (L) 3.87 - 5.11 MIL/uL   Hemoglobin 10.5 (L) 12.0 - 15.0 g/dL   HCT 54.0 (L) 98.1 - 19.1 %   MCV 95.6 80.0 - 100.0 fL   MCH 31.1 26.0 - 34.0 pg   MCHC 32.5 30.0 - 36.0 g/dL   RDW 47.8 (H) 29.5 - 62.1 %   Platelets 242 150 - 400 K/uL   nRBC 0.0 0.0 - 0.2 %    Comment: Performed at [facility], [address], [place], [place] [address]  Magnesium     Status: None   Collection Time: 10/30/22  8:25 AM  Result Value Ref Range   Magnesium 1.7 1.7 - 2.4 mg/dL    Comment: Performed at [facility], [address] [place], [place] [address]  Glucose, capillary     Status: None   Collection Time: 10/30/22  2:01 PM  Result Value Ref Range   Glucose-Capillary 80 70 - 99 mg/dL    Comment: Glucose reference range applies only to samples taken after  fasting for at least 8 hours.   No results found.    Blood pressure 132/69, pulse 66, temperature 97.9 F (36.6 C), temperature source Oral, resp. rate 20, height 5\' 3"  (1.6 m), weight 55.4 kg, SpO2 100%.  Medical Problem List and Plan: 1. Functional deficits secondary to TBI and polytrauma due to a fall with associated Croop contrecoup brain injury, right frontal lobe hemorrhagic contusion and right frontal ischemic nonhemorrhagic infarct.  -patient may shower  -ELOS/Goals: 7-10 days, PT/OT/SLP modeled to supervision  -Admit to CIR 2.  Antithrombotics: -DVT/anticoagulation:  Mechanical: Sequential compression devices, below knee Bilateral lower extremities  -antiplatelet therapy: ASA 3. Pain Management: Tylenol prn.  4. Mood/Behavior/Sleep: LCSW to follow for evaluation and support.   -antipsychotic agents: N/A  --chronic insomnia. Naps on and off throughout the day. 5. Neuropsych/cognition: This patient is not fully capable of making decisions on her own behalf. 6. Skin/Wound Care:  Routine pressure relief measures.  7. Fluids/Electrolytes/Nutrition: Strict I/O. Change to renal diet. Continue 1500 cc FR.  8. A fib w/RVR/Acute systolic CHF: Fluid overload due to A fib and managed with HD.   --Duo nebs prn added today for SOB? 1500 cc FR/monitor weights daily.   --monitor HR TID--on amiodarone taper. Continue metoprolol  9. HTN: Monitor BP TID--on metoprolol bid.  Monitor with mobility 10. ESRD: HD MWF at the end of the day to help with tolerance of therapy  --hx of intradialysis hypotension (midodrine in the past) 11. Pancreatic insufficiency s/p Whipple: Continue Creon with meals  BID ac  --has dumping syndrome (abdominal pain with meals followed by diarrhea at times) 12. Left pubic rami/Sacral Ala at S4: WBAT per Dr. Thad Ranger. Follow up Xrays in 2 weeks. 13. L-renal mass (likely RCC) urine leak s/p chronic nephrostomy tube: Tube exchanged out on 09/03 by IR per protocol  --no on AC due to bleeding from nephrostomy tube/kidney.  Measure output daily.   14. T2DM diet controlled w/peripheral neuropathy: Monitor BS ac/hs and use SSI for elevated BS.  --Hgb A1C- 6.1. will add CM restrictions.  15. Dizziness: question vertigo --will order vestibular eval  --also check orthostatic vitals.  Continue meclizine 16. Anemia of CKD: Monitor with serial checks. H/H stable in 10 range.  17. H/o Shingles w/optic nerve involvement: On Valtrex.  18. Restless leg syndrome: On Requip 0.5 mg HS with additional 0.25 mg prn during the day.   Body mass index is 21.64 kg/m.   Jacquelynn Cree, PA-C 10/30/2022   I have personally performed a face to face diagnostic evaluation of this patient and formulated the key components of the plan.  Additionally, I have personally reviewed laboratory data, imaging studies, as well as relevant notes and concur with the physician assistant's documentation above.  The patient's status has not changed from the original H&P.  Any changes in documentation  from the acute care chart have been noted above.  Fanny Dance, MD, [place]

## 2022-10-30 NOTE — Progress Notes (Signed)
INPATIENT REHABILITATION ADMISSION NOTE   Arrival Method:BED     Mental Orientation: X4   Assessment: Done per flowsheet   Skin: Refer to flowsheet   IV'S: AV Fistula LUA/ IV right forearm saline lock   Pain: Denies pain   Tubes and Drains: Nephrostomy   Safety Measures:   Vital Signs: Per flowsheet   Height and Weight: Per flowsheet   Rehab Orientation: Pt  oriented to the unit   Family: Not available at this time    Notes:

## 2022-10-30 NOTE — Hospital Course (Addendum)
  Brief Narrative: 87 year old past medical history significant for Whipple procedure, small bowel resection, due to nonmalignant pancreatic mass, RCC with left renal embolization and ablation with nephrostomy tube in place, hypertension, A-fib not on anticoagulation, ESRD on hemodialysis MWF, her son-in-law went to pick her up for hemodialysis on 9/2 and found her asleep on the floor.  She does not remember falling.  Evaluation in the ED patient was found to have a right frontal hemorrhagic contusion and nondisplaced left parietal and occipital fracture, left posterior scalp hematoma, small nondisplaced S4 fracture.  Patient admitted for traumatic brain injury after a fall with apparent loss of consciousness versus mechanical fall with intact consciousness.  Patient was evaluated by neurosurgery, admitted to the ICU,  systolic blood pressure recommended 90-160.   During course of hospitalization patient was found to have A-fib with RVR, required IV amiodarone, cardiology consulted.  Subsequently transition to oral amiodarone.  During workup  MRI was obtained which showed acute versus subacute ischemic nonhemorrhagic infarct.  Evaluated by neurology who recommended starting a baby aspirin.   CIR approved this afternoon, will discharge the patient to CIR.   Assessment & Plan:   Principal Problem:   Traumatic brain injury [facility]) Active Problems:   Contusion of right cerebral hemisphere [facility])   Closed skull fracture (HCC)   Sacral fracture, closed (HCC)   Exocrine pancreatic insufficiency   Hypertension   Acute systolic heart failure (HCC)   Atrial fibrillation, rapid (HCC)   Pressure injury of skin   Lacunar infarct, acute (HCC)   TBI, Right frontal contrecoup hemorrhagic cerebral contusion -Nondisplaced left parietal and left occipital skull fracture -Scalp hematoma -Skull fracture: -CT head is stable.  Initial neurosurgery recommendations was to hold aspirin for 1 week and DVT prophylaxis  for 72 hours from 9/3.  Repeat CT head on 9/5 shows stable hemorrhagic contusion. EEG-unremarkable    Acute/subacute ischemic nonhemorrhagic Infarct:  Seen on the MRI brain. -ECHO: EF 45-50 % global hypokinesis. Per cardiology likely related to A fib. Out patient evaluation.  LDL 38, A1c 6.8 Carotid Dopplers and EEG are negative   A fib RVR;  New acute systolic, Heart failure.  Initially required IV amiodarone, cardiology consulted.  Now on amiodarone 20 mg twice daily for 1 week followed by 20 mg daily.  Not on prior anticoagulation due to bleeding from nephrostomy tube and now with hemorrhagic contusion   Hypertension/hypotension As needed midodrine during dialysis   ESRD on hemodialysis Per nephro  Dizziness - Meclizine as needed     RCC s/p embolization/ablation, status post left nephrostomy tube Status post nephrostomy tube exchange on Status post left nephrostomy tube exchange 9/3     S/ post Whipple 2018 Exocrine pancreatic insufficiency   IDDM:  -SSI   Frequent fall.  Left inferior pubic ramus minimally displaced fracture and probably displaced fracture of the left superior pubic ramus Subtle nondisplaced fracture of the anterior cortex of the sacrum at S4. Weightbearing as tolerated follow-up outpatient   Restless leg Syndrome:  Resume home dose requip.    Estimated body mass index is 21.4 kg/m as calculated from the following:   Height as of this encounter: 5\' 3"  (1.6 m).   Weight as of this encounter: 54.8 kg.     DVT prophylaxis: SCD Code Status: Full code Family Communication:  Disposition Plan:  Status is: Inpatient Remains inpatient appropriate because: Discharge patient to CIR

## 2022-10-30 NOTE — Progress Notes (Signed)
OT Cancellation Note  Patient Details Name: Kim Burns MRN: [id number] DOB: [birth year]   Cancelled Treatment:    Reason Eval/Treat Not Completed: Patient at procedure or test/ unavailable (at HD)  Carver Fila, OTD, OTR/L SecureChat Preferred Acute Rehab [phone]   Dalphine Handing 10/30/2022, 11:52 AM

## 2022-10-30 NOTE — TOC Transition Note (Signed)
Transition of Care (TOC) - CM/SW Discharge Note Donn Pierini RN, BSN Transitions of Care Unit 4E- RN Case Manager See Treatment Team for direct phone #   Patient Details  Name: Kim Burns MRN: [id number] Date of Birth: [birth year]  Transition of Care [facility]) CM/SW Contact:  Darrold Span, RN Phone Number: 10/30/2022, 3:26 PM   Clinical Narrative:    CM notified from INPT rehab liaison pt has received auth for INPT rehab and has bed available today for admission.  MD has cleared pt for transition to INPT rehab today. Pt will discharge this afternoon to [facility] INPT rehab.   No further TOC needs noted.    Final next level of care: IP Rehab Facility Barriers to Discharge: Barriers Resolved   Patient Goals and CMS Choice CMS Medicare.gov Compare Post Acute Care list provided to:: Patient Choice offered to / list presented to : Patient  Discharge Placement                 INPT Rehab        Discharge Plan and Services Additional resources added to the After Visit Summary for     Discharge Planning Services: CM Consult Post Acute Care Choice: IP Rehab          DME Arranged: N/A DME Agency: NA       HH Arranged: NA HH Agency: NA        Social Determinants of Health (SDOH) Interventions SDOH Screenings   Food Insecurity: No Food Insecurity (11/01/2021)  Housing: Low Risk  (11/01/2021)  Transportation Needs: No Transportation Needs (11/01/2021)  Utilities: Not At Risk (11/01/2021)  Tobacco Use: Low Risk  (10/29/2022)     Readmission Risk Interventions    10/30/2022    3:26 PM 08/27/2021   10:16 AM  Readmission Risk Prevention Plan  Transportation Screening Complete Complete  PCP or Specialist Appt within 3-5 Days  Complete  HRI or Home Care Consult  Complete  Social Work Consult for Recovery Care Planning/Counseling  Complete  Palliative Care Screening  Not Applicable  Medication Review [employer]) Complete Complete  HRI or Home Care Consult  Complete   SW Recovery Care/Counseling Consult Complete   Palliative Care Screening Not Applicable   Skilled Nursing Facility Not Applicable

## 2022-10-30 NOTE — Progress Notes (Signed)
Occupational Therapy Treatment Patient Details Name: Kim Burns MRN: [id number] DOB: 11-Jun-1935 Today's Date: 10/30/2022   History of present illness Pt is an 87 y.o. female who presented to [facility] ED on 10/21/22 after being found asleep on the floor by family with pt not remembering falling. Pt found to have right frontal hemorrhagic contusion, a nondisplaced left parietal and occipital fracture, left posterior scalp hematoma, and small nondisplaced S4 fracture. CT scan on 10/24/22 revealed Small new Left > right Subdural Hematomas have developed since 10/21/22. Recent admission in July 2024 for afib with RVR. Found to have left inferior pubic ramus fx felt to be minimally displaced and no interventions needed WBAT per ortho 10/26/22.  Also found to have a small acute ischemic stroke in posterior right frontal region with additional possible punctate infarct involving right anterior frontal lobe on MRI 9/7 felt cardioembolic in setting of a-fib not on anticoagulation. PMH - afib, ESRD on HD, chf, DM, HTN, Whipple.   OT comments  Pt limited by dizziness this session, able to sit EOB with mod A this session, upon sitting EOB pt with posterior lean for a brief second, closing eyes and needing up to total A to remain upright. Pt then more interactive and reports it feels like she "passed out", RN notified. Pt returned to supine and BP taken 121/73 (87), VSS. Pt denies blurred or double vision, began education on gaze stabilization exercises however pt reports minimal improvement and is unable to keep eyes opened consistently secondary to dizziness. Pt presenting with impairments listed below, will follow acutely. Patient will benefit from intensive inpatient follow up therapy, >3 hours/day to maximize safety/ind with ADLs/functional mobility.       If plan is discharge home, recommend the following:  A little help with walking and/or transfers;A lot of help with bathing/dressing/bathroom;Assistance with  cooking/housework;Direct supervision/assist for medications management;Direct supervision/assist for financial management;Assist for transportation;Help with stairs or ramp for entrance;Supervision due to cognitive status   Equipment Recommendations  Other (comment) (defer)    Recommendations for Other Services PT consult;Rehab consult    Precautions / Restrictions Precautions Precautions: Fall Precaution Comments: dizziness and +orthostatic Restrictions Weight Bearing Restrictions: Yes LLE Weight Bearing: Weight bearing as tolerated Other Position/Activity Restrictions: use RW per ortho MD Reynolds       Mobility Bed Mobility Overal bed mobility: Needs Assistance Bed Mobility: Sidelying to Sit   Sidelying to sit: Mod assist   Sit to supine: Mod assist        Transfers                   General transfer comment: deferred this session for safety     Balance Overall balance assessment: Needs assistance Sitting-balance support: No upper extremity supported, Feet supported Sitting balance-Leahy Scale: Fair Sitting balance - Comments: min-total A at times due to dizziness                                   ADL either performed or assessed with clinical judgement   ADL Overall ADL's : Needs assistance/impaired Eating/Feeding: Set up Eating/Feeding Details (indicate cue type and reason): eating lunch in bed/chair position                                        Extremity/Trunk Assessment Upper Extremity Assessment RUE Deficits /  Details: generalized weakness, mild decr coordination noted in RUE compared to LUE RUE Sensation: history of peripheral neuropathy RUE Coordination: decreased fine motor LUE Deficits / Details: gerneralized weakness; pt reposts hx of numbness in thumb and first finger (worse on Right); decreased fine motor coordinaiton LUE Sensation: history of peripheral neuropathy LUE Coordination: decreased fine motor    Lower Extremity Assessment Lower Extremity Assessment: Defer to PT evaluation        Vision   Additional Comments: denies double vision, mild blurred vision but states was baseline PTA   Perception Perception Perception: Not tested   Praxis Praxis Praxis: Not tested    Cognition Arousal: Alert Behavior During Therapy: [facility] Dt for tasks assessed/performed Overall Cognitive Status: Impaired/Different from baseline Area of Impairment: Problem solving, Attention                   Current Attention Level: Selective         Problem Solving: Slow processing General Comments: some slow processing, but follows simple commands consistently        Exercises      Shoulder Instructions       General Comments VSS on RA    Pertinent Vitals/ Pain       Pain Assessment Pain Assessment: Faces Pain Score: 4  Faces Pain Scale: Hurts little more Pain Location: LLE with mobility Pain Descriptors / Indicators: Discomfort, Guarding, Grimacing Pain Intervention(s): Limited activity within patient's tolerance, Monitored during session, Repositioned  Home Living                                          Prior Functioning/Environment              Frequency  Min 1X/week        Progress Toward Goals  OT Goals(current goals can now be found in the care plan section)  Progress towards OT goals: Not progressing toward goals - comment (limited by dizziness)  Acute Rehab OT Goals Patient Stated Goal: none stated OT Goal Formulation: With patient Time For Goal Achievement: 11/07/22 Potential to Achieve Goals: Good ADL Goals Pt Will Perform Grooming: with supervision;standing Pt Will Perform Lower Body Bathing: with supervision;sit to/from stand Pt Will Perform Lower Body Dressing: with supervision;sit to/from stand Pt Will Transfer to Toilet: with supervision;ambulating;regular height toilet;grab bars Pt Will Perform Toileting - Clothing Manipulation  and hygiene: with supervision;sitting/lateral leans;sit to/from stand  Plan      Co-evaluation                 AM-PAC OT "6 Clicks" Daily Activity     Outcome Measure   Help from another person eating meals?: A Little Help from another person taking care of personal grooming?: A Little Help from another person toileting, which includes using toliet, bedpan, or urinal?: A Lot Help from another person bathing (including washing, rinsing, drying)?: A Lot Help from another person to put on and taking off regular upper body clothing?: A Lot Help from another person to put on and taking off regular lower body clothing?: A Lot 6 Click Score: 14    End of Session    OT Visit Diagnosis: History of falling (Z91.81);Muscle weakness (generalized) (M62.81)   Activity Tolerance Patient limited by lethargy;Patient limited by fatigue   Patient Left in bed;with call bell/phone within reach;with bed alarm set   Nurse Communication Mobility status  Time: 6440-3474 OT Time Calculation (min): 27 min  Charges: OT General Charges $OT Visit: 1 Visit OT Treatments $Therapeutic Activity: 23-37 mins  Carver Fila, OTD, OTR/L SecureChat Preferred Acute Rehab [phone]   Dalphine Handing 10/30/2022, 2:48 PM

## 2022-10-30 NOTE — Progress Notes (Signed)
Inpatient Rehab Admissions Coordinator:   Continue to await insurance auth for  CIR. Will follow for potential admit pending auth.   Megan Salon, MS, CCC-SLP Rehab Admissions Coordinator  [phone] (celll) [phone] (office)

## 2022-10-30 NOTE — Procedures (Signed)
I was present at this dialysis session. I have reviewed the session itself and made appropriate changes.   3K bath, UF goal 2L.  Using AVF.  Tolerating well. Still with vertigo. Tentative for CIR>     Filed Weights   10/29/22 0501 10/30/22 0410 10/30/22 0824  Weight: 54.8 kg 57.2 kg 56.9 kg    Recent Labs  Lab 10/28/22 0906  NA 130*  K 3.5  CL 93*  CO2 23  GLUCOSE 149*  BUN 38*  CREATININE 4.23*  CALCIUM 7.6*  PHOS 6.5*    Recent Labs  Lab 10/24/22 1536 10/28/22 0906 10/30/22 0825  WBC 7.7 8.8 7.7  HGB 13.0 10.9* 10.5*  HCT 40.4 33.4* 32.3*  MCV 96.4 94.1 95.6  PLT 195 214 242    Scheduled Meds:  acetaminophen  500 mg Oral TID   amiodarone  200 mg Oral BID   Followed by   Melene Muller ON 11/04/2022] amiodarone  200 mg Oral Daily   aspirin  81 mg Oral Daily   Chlorhexidine Gluconate Cloth  6 each Topical Daily   Chlorhexidine Gluconate Cloth  6 each Topical Q0600   Chlorhexidine Gluconate Cloth  6 each Topical Q0600   insulin aspart  0-6 Units Subcutaneous TID WC   lidocaine  1 patch Transdermal Q24H   lipase/protease/amylase  36,000 Units Oral BID WC   metoprolol succinate  25 mg Oral Daily   prednisoLONE acetate  1 drop Both Eyes BID   rOPINIRole  0.5 mg Oral QHS   sevelamer carbonate  2,400 mg Oral TID WC   valACYclovir  500 mg Oral QHS   Continuous Infusions: PRN Meds:.acetaminophen, cycloSPORINE, docusate sodium, guaiFENesin, hydrALAZINE, ipratropium-albuterol, meclizine, metoprolol tartrate, olopatadine, ondansetron (ZOFRAN) IV, mouth rinse, polyethylene glycol, rOPINIRole, senna-docusate   [name] Heck  MD 10/30/2022, 9:18 AM

## 2022-10-30 NOTE — Progress Notes (Signed)
PT Cancellation Note  Patient Details Name: DRINDA HASBERRY MRN: [id number] DOB: May 15, 1935   Cancelled Treatment:    Reason Eval/Treat Not Completed: Patient at procedure or test/unavailable (Pt in HD.  Will return as able.)   Bevelyn Buckles 10/30/2022, 9:12 AM Gyneth Hubka M,PT Acute Rehab Services [phone]

## 2022-10-31 DIAGNOSIS — S32402D Unspecified fracture of left acetabulum, subsequent encounter for fracture with routine healing: Secondary | ICD-10-CM

## 2022-10-31 DIAGNOSIS — N186 End stage renal disease: Secondary | ICD-10-CM | POA: Diagnosis not present

## 2022-10-31 DIAGNOSIS — S069X1D Unspecified intracranial injury with loss of consciousness of 30 minutes or less, subsequent encounter: Secondary | ICD-10-CM | POA: Diagnosis not present

## 2022-10-31 DIAGNOSIS — I4891 Unspecified atrial fibrillation: Secondary | ICD-10-CM | POA: Diagnosis not present

## 2022-10-31 DIAGNOSIS — H8110 Benign paroxysmal vertigo, unspecified ear: Secondary | ICD-10-CM

## 2022-10-31 DIAGNOSIS — S069XAA Unspecified intracranial injury with loss of consciousness status unknown, initial encounter: Principal | ICD-10-CM | POA: Diagnosis present

## 2022-10-31 LAB — COMPREHENSIVE METABOLIC PANEL
ALT: 32 U/L (ref 0–44)
AST: 21 U/L (ref 15–41)
Albumin: 2 g/dL — ABNORMAL LOW (ref 3.5–5.0)
Alkaline Phosphatase: 71 U/L (ref 38–126)
Anion gap: 11 (ref 5–15)
BUN: 21 mg/dL (ref 8–23)
CO2: 26 mmol/L (ref 22–32)
Calcium: 8.3 mg/dL — ABNORMAL LOW (ref 8.9–10.3)
Chloride: 95 mmol/L — ABNORMAL LOW (ref 98–111)
Creatinine, Ser: 2.85 mg/dL — ABNORMAL HIGH (ref 0.44–1.00)
GFR, Estimated: 16 mL/min — ABNORMAL LOW (ref >60)
Glucose, Bld: 101 mg/dL — ABNORMAL HIGH (ref 70–99)
Potassium: 3.3 mmol/L — ABNORMAL LOW (ref 3.5–5.1)
Sodium: 132 mmol/L — ABNORMAL LOW (ref 135–145)
Total Bilirubin: 0.6 mg/dL (ref 0.3–1.2)
Total Protein: 4.5 g/dL — ABNORMAL LOW (ref 6.5–8.1)

## 2022-10-31 LAB — CBC WITH DIFFERENTIAL/PLATELET
Abs Immature Granulocytes: 0.04 10*3/uL (ref 0.00–0.07)
Basophils Absolute: 0 10*3/uL (ref 0.0–0.1)
Basophils Relative: 0 %
Eosinophils Absolute: 0.1 10*3/uL (ref 0.0–0.5)
Eosinophils Relative: 2 %
HCT: 34.2 % — ABNORMAL LOW (ref 36.0–46.0)
Hemoglobin: 11.2 g/dL — ABNORMAL LOW (ref 12.0–15.0)
Immature Granulocytes: 1 %
Lymphocytes Relative: 13 %
Lymphs Abs: 1.1 10*3/uL (ref 0.7–4.0)
MCH: 30.9 pg (ref 26.0–34.0)
MCHC: 32.7 g/dL (ref 30.0–36.0)
MCV: 94.5 fL (ref 80.0–100.0)
Monocytes Absolute: 0.5 10*3/uL (ref 0.1–1.0)
Monocytes Relative: 6 %
Neutro Abs: 6.8 10*3/uL (ref 1.7–7.7)
Neutrophils Relative %: 78 %
Platelets: 265 10*3/uL (ref 150–400)
RBC: 3.62 MIL/uL — ABNORMAL LOW (ref 3.87–5.11)
RDW: 17.1 % — ABNORMAL HIGH (ref 11.5–15.5)
WBC: 8.6 10*3/uL (ref 4.0–10.5)
nRBC: 0 % (ref 0.0–0.2)

## 2022-10-31 LAB — GLUCOSE, CAPILLARY
Glucose-Capillary: 115 mg/dL — ABNORMAL HIGH (ref 70–99)
Glucose-Capillary: 127 mg/dL — ABNORMAL HIGH (ref 70–99)
Glucose-Capillary: 185 mg/dL — ABNORMAL HIGH (ref 70–99)
Glucose-Capillary: 92 mg/dL (ref 70–99)
Glucose-Capillary: 94 mg/dL (ref 70–99)

## 2022-10-31 LAB — BASIC METABOLIC PANEL
Anion gap: 12 (ref 5–15)
BUN: 16 mg/dL (ref 8–23)
CO2: 26 mmol/L (ref 22–32)
Calcium: 8.2 mg/dL — ABNORMAL LOW (ref 8.9–10.3)
Chloride: 95 mmol/L — ABNORMAL LOW (ref 98–111)
Creatinine, Ser: 2.59 mg/dL — ABNORMAL HIGH (ref 0.44–1.00)
GFR, Estimated: 18 mL/min — ABNORMAL LOW (ref >60)
Glucose, Bld: 104 mg/dL — ABNORMAL HIGH (ref 70–99)
Potassium: 3.3 mmol/L — ABNORMAL LOW (ref 3.5–5.1)
Sodium: 133 mmol/L — ABNORMAL LOW (ref 135–145)

## 2022-10-31 MED ORDER — CYCLOSPORINE 0.05 % OP EMUL: 1.0000 [drp] | Freq: Every day | OPHTHALMIC | Status: DC | PRN

## 2022-10-31 MED ORDER — ALUM & MAG HYDROXIDE-SIMETH 200-200-20 MG/5ML PO SUSP: 30.0000 mL | ORAL | Status: DC | PRN

## 2022-10-31 MED ORDER — CHLORHEXIDINE GLUCONATE CLOTH 2 % EX PADS
6.0000 | MEDICATED_PAD | Freq: Every day | CUTANEOUS | Status: DC
  Administered 2022-11-06 – 2022-11-12 (×3): 6 via TOPICAL

## 2022-10-31 MED ORDER — INSULIN ASPART 100 UNIT/ML IJ SOLN: 0.0000 [IU] | Freq: Every day | INTRAMUSCULAR | Status: DC

## 2022-10-31 MED ORDER — GUAIFENESIN 100 MG/5ML PO LIQD
5.0000 mL | ORAL | Status: DC | PRN
  Administered 2022-11-01: 5 mL via ORAL
  Filled 2022-10-31: qty 5

## 2022-10-31 MED ORDER — LIDOCAINE 5 % EX PTCH
1.0000 | MEDICATED_PATCH | CUTANEOUS | Status: DC
  Administered 2022-10-31 – 2022-11-15 (×8): 1 via TRANSDERMAL
  Filled 2022-10-31 (×11): qty 1

## 2022-10-31 MED ORDER — PANCRELIPASE (LIP-PROT-AMYL) 36000-114000 UNITS PO CPEP
36000.0000 [IU] | ORAL_CAPSULE | Freq: Two times a day (BID) | ORAL | Status: DC
  Administered 2022-10-31 – 2022-11-16 (×31): 36000 [IU] via ORAL
  Filled 2022-10-31 (×32): qty 1

## 2022-10-31 MED ORDER — ROPINIROLE HCL 0.25 MG PO TABS
0.2500 mg | ORAL_TABLET | Freq: Every day | ORAL | Status: DC | PRN
  Administered 2022-11-01 – 2022-11-03 (×2): 0.25 mg via ORAL
  Filled 2022-10-31 (×2): qty 1

## 2022-10-31 MED ORDER — ROPINIROLE HCL 1 MG PO TABS
0.5000 mg | ORAL_TABLET | Freq: Every day | ORAL | Status: DC
  Administered 2022-11-06: 0.5 mg via ORAL
  Filled 2022-10-31 (×4): qty 1

## 2022-10-31 MED ORDER — ROPINIROLE HCL 0.25 MG PO TABS
0.2500 mg | ORAL_TABLET | Freq: Every day | ORAL | Status: DC | PRN
  Administered 2022-10-31 – 2022-11-14 (×9): 0.25 mg via ORAL
  Filled 2022-10-31 (×12): qty 1

## 2022-10-31 MED ORDER — BISACODYL 10 MG RE SUPP: 10.0000 mg | Freq: Every day | RECTAL | Status: DC | PRN

## 2022-10-31 MED ORDER — ORAL CARE MOUTH RINSE: 15.0000 mL | OROMUCOSAL | Status: DC | PRN

## 2022-10-31 MED ORDER — AMIODARONE HCL 200 MG PO TABS: 200.0000 mg | ORAL_TABLET | Freq: Two times a day (BID) | ORAL | Status: DC

## 2022-10-31 MED ORDER — KIDNEY FAILURE BOOK: Freq: Once | Status: AC

## 2022-10-31 MED ORDER — SEVELAMER CARBONATE 800 MG PO TABS
2400.0000 mg | ORAL_TABLET | Freq: Three times a day (TID) | ORAL | Status: DC
  Administered 2022-10-31 – 2022-11-05 (×16): 2400 mg via ORAL
  Filled 2022-10-31 (×17): qty 3

## 2022-10-31 MED ORDER — PROCHLORPERAZINE MALEATE 5 MG PO TABS: 5.0000 mg | ORAL_TABLET | Freq: Four times a day (QID) | ORAL | Status: DC | PRN

## 2022-10-31 MED ORDER — SENNOSIDES-DOCUSATE SODIUM 8.6-50 MG PO TABS: 1.0000 | ORAL_TABLET | Freq: Every evening | ORAL | Status: DC | PRN

## 2022-10-31 MED ORDER — OLOPATADINE HCL 0.1 % OP SOLN: 1.0000 [drp] | Freq: Every day | OPHTHALMIC | Status: DC | PRN

## 2022-10-31 MED ORDER — FLEET ENEMA RE ENEM: 1.0000 | ENEMA | Freq: Once | RECTAL | Status: DC | PRN

## 2022-10-31 MED ORDER — IPRATROPIUM-ALBUTEROL 0.5-2.5 (3) MG/3ML IN SOLN: 3.0000 mL | RESPIRATORY_TRACT | Status: DC | PRN

## 2022-10-31 MED ORDER — ROPINIROLE HCL 1 MG PO TABS
0.5000 mg | ORAL_TABLET | Freq: Every day | ORAL | Status: DC
  Administered 2022-10-31 – 2022-11-15 (×16): 0.5 mg via ORAL
  Filled 2022-10-31 (×16): qty 1

## 2022-10-31 MED ORDER — NAPHAZOLINE-GLYCERIN 0.012-0.25 % OP SOLN: 1.0000 [drp] | Freq: Four times a day (QID) | OPHTHALMIC | Status: DC | PRN

## 2022-10-31 MED ORDER — ACETAMINOPHEN 325 MG PO TABS
325.0000 mg | ORAL_TABLET | ORAL | Status: DC | PRN
  Administered 2022-11-03: 650 mg via ORAL
  Administered 2022-11-08: 625 mg via ORAL
  Administered 2022-11-08 – 2022-11-11 (×3): 650 mg via ORAL
  Filled 2022-10-31 (×6): qty 2

## 2022-10-31 MED ORDER — ASPIRIN 81 MG PO CHEW
81.0000 mg | CHEWABLE_TABLET | Freq: Every day | ORAL | Status: DC
  Administered 2022-10-31 – 2022-11-16 (×17): 81 mg via ORAL
  Filled 2022-10-31 (×17): qty 1

## 2022-10-31 MED ORDER — GUAIFENESIN-DM 100-10 MG/5ML PO SYRP: 5.0000 mL | ORAL_SOLUTION | Freq: Four times a day (QID) | ORAL | Status: DC | PRN

## 2022-10-31 MED ORDER — ACETAMINOPHEN 500 MG PO TABS
500.0000 mg | ORAL_TABLET | Freq: Three times a day (TID) | ORAL | Status: DC
  Administered 2022-10-31 – 2022-11-16 (×43): 500 mg via ORAL
  Filled 2022-10-31 (×43): qty 1

## 2022-10-31 MED ORDER — MECLIZINE HCL 25 MG PO TABS
12.5000 mg | ORAL_TABLET | Freq: Three times a day (TID) | ORAL | Status: DC
  Administered 2022-10-31 – 2022-11-01 (×3): 12.5 mg via ORAL
  Filled 2022-10-31 (×3): qty 1

## 2022-10-31 MED ORDER — PROCHLORPERAZINE 25 MG RE SUPP: 12.5000 mg | Freq: Four times a day (QID) | RECTAL | Status: DC | PRN

## 2022-10-31 MED ORDER — AMIODARONE HCL 200 MG PO TABS
200.0000 mg | ORAL_TABLET | Freq: Two times a day (BID) | ORAL | Status: AC
  Administered 2022-10-31 – 2022-11-03 (×8): 200 mg via ORAL
  Filled 2022-10-31 (×8): qty 1

## 2022-10-31 MED ORDER — PROCHLORPERAZINE EDISYLATE 10 MG/2ML IJ SOLN: 5.0000 mg | Freq: Four times a day (QID) | INTRAMUSCULAR | Status: DC | PRN

## 2022-10-31 MED ORDER — INSULIN ASPART 100 UNIT/ML IJ SOLN
0.0000 [IU] | Freq: Three times a day (TID) | INTRAMUSCULAR | Status: DC
  Administered 2022-11-01: 3 [IU] via SUBCUTANEOUS
  Administered 2022-11-05: 2 [IU] via SUBCUTANEOUS
  Administered 2022-11-07 – 2022-11-14 (×8): 1 [IU] via SUBCUTANEOUS

## 2022-10-31 MED ORDER — VALACYCLOVIR HCL 500 MG PO TABS
500.0000 mg | ORAL_TABLET | Freq: Every day | ORAL | Status: DC
  Administered 2022-10-31 – 2022-11-15 (×14): 500 mg via ORAL
  Filled 2022-10-31 (×15): qty 1

## 2022-10-31 MED ORDER — METOPROLOL SUCCINATE ER 25 MG PO TB24
25.0000 mg | ORAL_TABLET | Freq: Every day | ORAL | Status: DC
  Administered 2022-10-31 – 2022-11-16 (×16): 25 mg via ORAL
  Filled 2022-10-31 (×17): qty 1

## 2022-10-31 MED ORDER — AMIODARONE HCL 200 MG PO TABS: 200.0000 mg | ORAL_TABLET | Freq: Every day | ORAL | Status: DC

## 2022-10-31 MED ORDER — MECLIZINE HCL 25 MG PO TABS
12.5000 mg | ORAL_TABLET | Freq: Three times a day (TID) | ORAL | Status: DC | PRN
  Administered 2022-10-31: 12.5 mg via ORAL
  Filled 2022-10-31: qty 1

## 2022-10-31 MED ORDER — TRAZODONE HCL 50 MG PO TABS: 25.0000 mg | ORAL_TABLET | Freq: Every evening | ORAL | Status: DC | PRN

## 2022-10-31 MED ORDER — OFF THE BEAT BOOK
Freq: Once | Status: AC
  Filled 2022-10-31: qty 1

## 2022-10-31 MED ORDER — DIPHENHYDRAMINE HCL 25 MG PO CAPS: 25.0000 mg | ORAL_CAPSULE | Freq: Four times a day (QID) | ORAL | Status: DC | PRN

## 2022-10-31 MED ORDER — AMIODARONE HCL 200 MG PO TABS
200.0000 mg | ORAL_TABLET | Freq: Every day | ORAL | Status: DC
  Administered 2022-11-04 – 2022-11-16 (×13): 200 mg via ORAL
  Filled 2022-10-31 (×13): qty 1

## 2022-10-31 NOTE — Consult Note (Addendum)
WOC Nurse Consult Note: Reason for Consult: Consult requested for bilat buttocks. Performed remotely after review of progress notes and photos the EMR.  Pressure Injury POA: Yes; these were noted as present on admission in the bedside nurses' wound care flow sheet. Deep tissue injuries are high risk to evolve into full thickness tissue loss within 7-10 days.  Dressing procedure/placement/frequency: Left buttock with red moist Stage 2 pressure injury Right buttock with 2 areas of dark red-purple Deep tissue pressure injuries separated by narrow bridge of skin in between.  Plan: Topical treatment orders provided for bedside nurses to perform as follows: Apply Xeroform gauze to left and right buttocks wounds Q day, then cover with sacral foam dressing.  Change foam dressing Q 3 days or PRN soiling. Please re-consult if further assistance is needed.  Thank-you,  Cammie Mcgee MSN, RN, CWOCN, [place], CNS [phone]

## 2022-10-31 NOTE — Progress Notes (Signed)
[facility] Progress Note   Subjective:   Pt is now in CIR. Continues to complain of dizziness, worse with sitting. Denies CP, palpitations, SOB, had some nausea today but now resolved.   Objective Vitals:   10/30/22 2112 10/31/22 0602  BP: 131/61 (!) 154/75  Pulse: 65 68  Resp: 16 18  Temp: 97.9 F (36.6 C) 98.5 F (36.9 C)  TempSrc: Oral Oral  SpO2:  99%  Weight: 55.4 kg 55.9 kg  Height: 5\' 3"  (1.6 m)    Physical Exam General: Elderly female, alert and in NAD Heart: RRR, no murmurs, rubs or gallops Lungs: CTA bilaterally Abdomen: Soft, non-tender, non-distended, +BS Extremities: No edema b/l lower extremities Dialysis Access: LUE AVF + t/b  Additional Objective Labs: Basic Metabolic Panel: Recent Labs  Lab 10/28/22 0906 10/30/22 0825 10/30/22 2303 10/31/22 0019 10/31/22 0804  NA 130*   < > 133* 133* 132*  K 3.5   < > 3.2* 3.3* 3.3*  CL 93*   < > 94* 95* 95*  CO2 23   < > 28 26 26   GLUCOSE 149*   < > 119* 104* 101*  BUN 38*   < > 16 16 21   CREATININE 4.23*   < > 2.53* 2.59* 2.85*  CALCIUM 7.6*   < > 8.1* 8.2* 8.3*  PHOS 6.5*  --  3.2  --   --    < > = values in this interval not displayed.   Liver Function Tests: Recent Labs  Lab 10/28/22 0906 10/30/22 2303 10/31/22 0804  AST  --   --  21  ALT  --   --  32  ALKPHOS  --   --  71  BILITOT  --   --  0.6  PROT  --   --  4.5*  ALBUMIN 2.0* 2.1* 2.0*   No results for input(s): "LIPASE", "AMYLASE" in the last 168 hours. CBC: Recent Labs  Lab 10/24/22 1536 10/28/22 0906 10/30/22 0825 10/30/22 2303 10/31/22 0804  WBC 7.7 8.8 7.7 7.6 8.6  NEUTROABS  --   --   --   --  6.8  HGB 13.0 10.9* 10.5* 11.7* 11.2*  HCT 40.4 33.4* 32.3* 35.1* 34.2*  MCV 96.4 94.1 95.6 96.4 94.5  PLT 195 214 242 248 265   Blood Culture    Component Value Date/Time   SDES BLOOD RIGHT ANTECUBITAL 01/22/2022 0038   SPECREQUEST  01/22/2022 0038    BOTTLES DRAWN AEROBIC AND ANAEROBIC Blood Culture results may  not be optimal due to an inadequate volume of blood received in culture bottles   CULT  01/22/2022 0038    NO GROWTH 5 DAYS Performed at [facility], [address] [place], [place] [address]    REPTSTATUS 01/27/2022 FINAL 01/22/2022 0038    Cardiac Enzymes: No results for input(s): "CKTOTAL", "CKMB", "CKMBINDEX", "TROPONINI" in the last 168 hours. CBG: Recent Labs  Lab 10/30/22 0550 10/30/22 1401 10/30/22 1626 10/31/22 0009 10/31/22 0556  GLUCAP 73 80 178* 94 92   Iron Studies: No results for input(s): "IRON", "TIBC", "TRANSFERRIN", "FERRITIN" in the last 72 hours. @lablastinr3 @ Studies/Results: No results found. Medications:  anticoagulant sodium citrate      acetaminophen  500 mg Oral TID   amiodarone  200 mg Oral BID   Followed by   Melene Muller ON 11/04/2022] amiodarone  200 mg Oral Daily   aspirin  81 mg Oral Daily   insulin aspart  0-5 Units Subcutaneous QHS   insulin  aspart  0-6 Units Subcutaneous TID WC   kidney failure book   Does not apply Once   lidocaine  1 patch Transdermal Q24H   lipase/protease/amylase  36,000 Units Oral BID WC   meclizine  12.5 mg Oral TID   metoprolol succinate  25 mg Oral Daily   off the beat book   Does not apply Once   rOPINIRole  0.5 mg Oral QHS   sevelamer carbonate  2,400 mg Oral TID WC   valACYclovir  500 mg Oral QHS    Outpatient Dialysis Orders: [facility] MWF   3.5h   55kg   350/1.5   2/2 bath  L AVF  Heparin none    Assessment/Plan: # Frontal Contusion and ischemic CVA: Managed by neurosurgery/neurology. F/u CT was normal. She seems to be improved. Still having dizziness, does not seem to be related to her BP   # ESRD - MWF HD. Cont MWF here.  No heparin. Using 3K bath due to hypokalemia   # Hypertension: bp's stable, slightly elevated but will avoid aggressive lowering w/ dizziness. On po metoprolol here.    # Volume - Stable, euvolemic on exam. UF 1-2 L w/ HD tomorrow.     # Anemia of CKD: Trend CBCs. Hb 10-13.  No  issues    # CKD-BMD: cont binders, P 6.5,  Corrected calcium is at goal  Rogers Blocker, PA-C 10/31/2022, 10:02 AM  [facility] Pager: [phone]

## 2022-10-31 NOTE — Progress Notes (Signed)
Occupational Therapy Assessment and Plan  Patient Details  Name: Kim Burns MRN: [id number] Date of Birth: 06-19-1935  OT Diagnosis: muscle weakness (generalized) and vertigo Rehab Potential: Rehab Potential (ACUTE ONLY): Excellent ELOS: 2.5-3 weeks   Today's Date: 10/31/2022 OT Individual Time: 4010-2725 OT Individual Time Calculation (min): 75 min     Hospital Problem: Principal Problem:   TBI (traumatic brain injury) (HCC)   Past Medical History:  Past Medical History:  Diagnosis Date   Anemia of chronic disease    takes iron   Anxiety    Blood transfusion without reported diagnosis    Bradycardia    Diabetes mellitus without complication (HCC)    became diabetic after Whipple procedure   Diarrhea    Dysrhythmia 04/16/2016   bradycardia due to medication    ESRD on hemodialysis [facility])    M-W-F   GERD (gastroesophageal reflux disease)    Gout    Headache    History of kidney stones 06/2013   Hyperparathyroidism (HCC)    Hypertension    PONV (postoperative nausea and vomiting)    Sleep apnea    no cpap machine. could not tolerate   Stroke (HCC) 04/16/2016   TIA 1995   Vitamin D deficiency    Past Surgical History:  Past Surgical History:  Procedure Laterality Date   A/V FISTULAGRAM Left 08/30/2021   Procedure: A/V Fistulagram;  Surgeon: Cephus Shelling, MD;  Location: [facility] INVASIVE CV LAB;  Service: Cardiovascular;  Laterality: Left;   ABDOMINAL HYSTERECTOMY  1985   complete   BACK SURGERY  1980   lower   BASCILIC VEIN TRANSPOSITION Left 04/24/2017   Procedure: LEFT ARM FIRST STAGE BASILIC VEIN TRANSPOSITION;  Surgeon: Nada Libman, MD;  Location: [facility] OR;  Service: Vascular;  Laterality: Left;   BASCILIC VEIN TRANSPOSITION Left 07/10/2017   Procedure: SECOND STAGE BASILIC VEIN TRANSPOSITION LEFT ARM;  Surgeon: Nada Libman, MD;  Location: [facility] OR;  Service: Vascular;  Laterality: Left;   BREAST LUMPECTOMY Left x 2   many years apart, benign    CHOLECYSTECTOMY  1985   CYSTOSCOPY W/ URETERAL STENT PLACEMENT Left 10/31/2021   Procedure: CYSTOSCOPY WITH RETROGRADE PYELOGRAM/URETERAL STENT PLACEMENT;  Surgeon: Crist Fat, MD;  Location: [facility] OR;  Service: Urology;  Laterality: Left;   CYSTOSCOPY WITH URETEROSCOPY AND STENT PLACEMENT Left 06/18/2013   Procedure: CYSTOSCOPY WITH Lef URETEROSCOPY AND Left STENT PLACEMENT;  Surgeon: Crecencio Mc, MD;  Location: [facility] ORS;  Service: Urology;  Laterality: Left;   ESOPHAGOGASTRODUODENOSCOPY (EGD) WITH PROPOFOL N/A 11/13/2016   Procedure: ESOPHAGOGASTRODUODENOSCOPY (EGD) WITH PROPOFOL;  Surgeon: Kathi Der, MD;  Location: [facility] ENDOSCOPY;  Service: Gastroenterology;  Laterality: N/A;   EUS N/A 02/07/2016   Procedure: ESOPHAGEAL ENDOSCOPIC ULTRASOUND (EUS) RADIAL;  Surgeon: Willis Modena, MD;  Location: [facility] ENDOSCOPY;  Service: Endoscopy;  Laterality: N/A;   EYE SURGERY Bilateral 2014   ioc for catracts    FOOT SURGERY Left 1990   something with toes unsure what    HERNIA REPAIR  2018   IR CATHETER TUBE CHANGE  03/14/2022   IR EMBO TUMOR ORGAN ISCHEMIA INFARCT INC GUIDE ROADMAPPING  08/22/2021   IR NEPHROSTOMY EXCHANGE LEFT  06/27/2022   IR NEPHROSTOMY EXCHANGE LEFT  08/26/2022   IR NEPHROSTOMY EXCHANGE LEFT  10/22/2022   IR NEPHROSTOMY PLACEMENT LEFT  03/14/2022   IR RADIOLOGIST EVAL & MGMT  05/18/2021   IR RADIOLOGIST EVAL & MGMT  10/01/2021   IR RADIOLOGIST EVAL & MGMT  11/12/2021  IR RADIOLOGIST EVAL & MGMT  11/22/2021   IR RADIOLOGIST EVAL & MGMT  11/27/2021   IR RADIOLOGIST EVAL & MGMT  12/20/2021   IR RENAL SUPRASEL UNI S&I MOD SED  08/22/2021   IR US GUIDE VASC ACCESS RIGHT  08/22/2021   LEFT HEART CATH AND CORONARY ANGIOGRAPHY N/A 08/27/2021   Procedure: LEFT HEART CATH AND CORONARY ANGIOGRAPHY;  Surgeon: Kathleene Hazel, MD;  Location: [facility] INVASIVE CV LAB;  Service: Cardiovascular;  Laterality: N/A;   PARATHYROID EXPLORATION     PERIPHERAL VASCULAR BALLOON ANGIOPLASTY Left 08/30/2021    Procedure: PERIPHERAL VASCULAR BALLOON ANGIOPLASTY;  Surgeon: Cephus Shelling, MD;  Location: [facility] INVASIVE CV LAB;  Service: Cardiovascular;  Laterality: Left;  arm fistula   RADIOLOGY WITH ANESTHESIA Left 08/22/2021   Procedure: MICROWAVE ABLATION;  Surgeon: Simonne Come, MD;  Location: [facility] ORS;  Service: Anesthesiology;  Laterality: Left;   WHIPPLE PROCEDURE N/A 04/23/2016   Procedure: WHIPPLE PROCEDURE;  Surgeon: Almond Lint, MD;  Location: [facility] OR;  Service: General;  Laterality: N/A;    Assessment & Plan Clinical Impression: Patient is a 87 y.o. year old female with recent admission to the hospital on \\with  medical hx significant for: Whipple procedure, 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 Pt presented to [facility] on 10/21/22 d/t being found down by family at home after apparent LOC. Imaging showed right frontal hemorrhagic contusion, a nondisplaced left parietal and occipital fracture, left posterior scalp hematoma, small nondisplaced S4 fracture. Neurosurgery consulted. Repeat CT showed stable right frontal contusion/hematoma. Cardiology consulted on 9/4 d/t A-fib with RVR. Started pt on amiodarone drip. Pt converted to sinus rhythm on 9/5, back in A-fib on 9/6, converted to sinus rhythm on 9/7. Due increased confusion on 9/4, a repeat CT on 9/5 showed stable hemorrhagic contusion and two small hygromas. Neurosurgery consulted and did not recommend surgical intervention. MRI on 9/6 was suspicious for small acute to early subacute ischemic nonhemorrhagic infarct. Possible additional punctate infarct involving anterior right frontal lobe. Neurology consulted and recommended full stroke workup. MRA was normal. 2D Echo found EF 45-50%Orthopedics consulted on 9/7 d/t pt c/o left hip pain. Imaging showed possible left inferior pubic rami fracture as well as small distal  sacral fracture. No surgical intervention warranted and pt allowed to weightbear as tolerated. Pt. Seen by PT/OT/SLP and they recommend CIR to assist return to PLOF. Patient transferred to CIR on 10/30/2022 .    Patient currently requires max with basic self-care skills secondary to muscle weakness, decreased cardiorespiratoy endurance, and decreased sitting balance, decreased standing balance, decreased postural control, and decreased balance strategies.  Prior to hospitalization, patient could complete BADL with supervision.  Patient will benefit from skilled intervention to increase independence with basic self-care skills prior to discharge home with care partner.  Anticipate patient will require 24 hour supervision and follow up home health.  OT - End of Session Endurance Deficit: Yes Endurance Deficit Description: rest breaks within BADL tasks OT Assessment Rehab Potential (ACUTE ONLY): Excellent OT Patient demonstrates impairments in the following area(s): Balance;Endurance;Cognition;Vision;Motor (dizziness) OT Basic ADL's Functional Problem(s): Grooming;Bathing;Dressing;Toileting OT Transfers Functional Problem(s): Toilet OT Additional Impairment(s): None OT Plan OT Intensity: Minimum of 1-2 x/day, 45 to 90 minutes OT Frequency: 5 out of 7 days OT Duration/Estimated Length of Stay: 2.5-3 weeks OT Treatment/Interventions: Balance/vestibular training;Cognitive remediation/compensation;Community reintegration;Discharge planning;Disease mangement/prevention;DME/adaptive equipment instruction;Functional electrical stimulation;Functional mobility training;Neuromuscular re-education;Pain  management;Patient/family education;Psychosocial support;Self Care/advanced ADL retraining;Skin care/wound managment;Splinting/orthotics;Therapeutic Activities;Therapeutic Exercise;UE/LE Strength taining/ROM;UE/LE Coordination activities;Visual/perceptual remediation/compensation;Wheelchair  propulsion/positioning OT Self Feeding Anticipated Outcome(s): mod I OT Basic Self-Care Anticipated Outcome(s): Supervision OT Toileting Anticipated Outcome(s): Supervision OT Bathroom Transfers Anticipated Outcome(s): Supervision OT Recommendation Patient destination: Home Follow Up Recommendations: Home health OT Equipment Recommended: To be determined Equipment Details: Has a RW and "walking stick"   OT Evaluation Precautions/Restrictions  Precautions Precautions: Fall Precaution Comments: dizziness Restrictions Weight Bearing Restrictions: Yes LLE Weight Bearing: Weight bearing as tolerated General   Vital Signs Therapy Vitals Temp: 98.5 F (36.9 C) Temp Source: Oral Pulse Rate: 68 Resp: 18 BP: (!) 154/75 Patient Position (if appropriate): Lying Oxygen Therapy SpO2: 99 % O2 Device: Room Air Pain   Home Living/Prior Functioning Home Living Available Help at Discharge: Family, Available 24 hours/day Type of Home: House Home Access: Stairs to enter [employer] of Steps: 4 to get in the house Entrance Stairs-Rails: Left, Right Home Layout: Able to live on main level with bedroom/bathroom, Laundry or work area in basement Alternate Teacher, music of Steps: ~13 Alternate Level Stairs-Rails: Left Bathroom Shower/Tub: Health visitor: Handicapped height Bathroom Accessibility: Yes  Lives With: Son (daughter stays as well when she's in town) IADL History Homemaking Responsibilities: No IADL Comments: Was indepednent with iADLs until July- now son and daughter split time with her, enjoys Archivist Prior Function Level of Independence: Independent with basic ADLs, Needs assistance with homemaking, Requires assistive device for independence Vision Baseline Vision/History: 1 Wears glasses Ability to See in Adequate Light: 1 Impaired Additional Comments: denies double vision, mild blurred vision but states was baseline PTA Perception   Perception: Within Functional Limits Praxis Praxis: WFL Cognition Cognition Overall Cognitive Status: Impaired/Different from baseline Arousal/Alertness: Lethargic Orientation Level: Person;Place;Situation Person: Oriented Place: Oriented Situation: Oriented Memory: Impaired Memory Impairment: Decreased recall of new information Attention: Sustained Sustained Attention: Appears intact Awareness: Appears intact Safety/Judgment: Appears intact Brief Interview for Mental Status (BIMS) Repetition of Three Words (First Attempt): 3 Temporal Orientation: Year: Correct Temporal Orientation: Month: Accurate within 5 days Temporal Orientation: Day: Incorrect Recall: "Sock": No, could not recall Recall: "Blue": Yes, no cue required Recall: "Bed": Yes, no cue required BIMS Summary Score: 12 Sensation Sensation Light Touch: Appears Intact Proprioception: Appears Intact Coordination Gross Motor Movements are Fluid and Coordinated: No Fine Motor Movements are Fluid and Coordinated: Yes Motor     Trunk/Postural Assessment     Balance Static Sitting Balance Static Sitting - Balance Support:  (UTA due to dizziness) Dynamic Sitting Balance Sitting balance - Comments: UTA due to dizziness Dynamic Standing Balance Dynamic Standing - Comments: UTA due to dizziness Extremity/Trunk Assessment RUE Assessment RUE Assessment: Within Functional Limits LUE Assessment LUE Assessment: Within Functional Limits  Care Tool Care Tool Self Care Eating   Eating Assist Level: Set up assist    Oral Care    Oral Care Assist Level: Minimal Assistance - Patient > 75%    Bathing   Body parts bathed by patient: Right arm;Chest;Left arm;Abdomen;Front perineal area;Buttocks;Face Body parts bathed by helper: Right upper leg;Left upper leg;Right lower leg;Left lower leg   Assist Level: Maximal Assistance - Patient 24 - 49%    Upper Body Dressing(including orthotics)   What is the patient wearing?:  Pull over shirt   Assist Level: Minimal Assistance - Patient > 75%    Lower Body Dressing (excluding footwear)   What is the patient wearing?: Pants Assist for lower body dressing: Maximal Assistance - Patient 25 -  49%    Putting on/Taking off footwear   What is the patient wearing?: Non-skid slipper socks Assist for footwear: Maximal Assistance - Patient 25 - 49%       Care Tool Toileting Toileting activity Toileting Activity did not occur (Clothing management and hygiene only): N/A (no void or bm)       Care Tool Bed Mobility Roll left and right activity   Roll left and right assist level: Minimal Assistance - Patient > 75%    Sit to lying activity        Lying to sitting on side of bed activity         Care Tool Transfers Sit to stand transfer Sit to stand activity did not occur: Safety/medical concerns (due to dizziness)      Chair/bed transfer Chair/bed transfer activity did not occur: Safety/medical concerns (due to dizziness)       Toilet transfer Toilet transfer activity did not occur: Safety/medical concerns (due to dizziness)       Care Tool Cognition  Expression of Ideas and Wants Expression of Ideas and Wants: 4. Without difficulty (complex and basic) - expresses complex messages without difficulty and with speech that is clear and easy to understand  Understanding Verbal and Non-Verbal Content Understanding Verbal and Non-Verbal Content: 4. Understands (complex and basic) - clear comprehension without cues or repetitions   Memory/Recall Ability Memory/Recall Ability : Current season;That he or she is in a hospital/hospital unit   Refer to Care Plan for Long Term Goals  SHORT TERM GOAL WEEK 1 OT Short Term Goal 1 (Week 1): Pt will tolerate sitting EOB for 5 mins in preparation for BADL tsaks OT Short Term Goal 2 (Week 1): Patient will complete toilet transfer with mod A of 1 OT Short Term Goal 3 (Week 1): Patient will complete 1 step of LB dressing task  using LRAD  Recommendations for other services: None    Skilled Therapeutic Intervention OT eval completed addressing rehab process, OT purpose, POC, ELOS, and goals.  Limited ADL assessment secondary to severe dizziness/vertigo.Bed level BADLs completed with min A UB and max A LB as described in detail below. Pt left semi-reclined in bed with needs met.  ADL ADL Eating: Supervision/safety Grooming: Minimal assistance Upper Body Bathing: Minimal assistance Lower Body Bathing: Maximal assistance Upper Body Dressing: Minimal assistance Lower Body Dressing: Maximal assistance Toileting: Not assessed Mobility  Bed Mobility Bed Mobility: Rolling Left;Rolling Right Rolling Right: Minimal Assistance - Patient > 75% Rolling Left: Minimal Assistance - Patient > 75%   Discharge Criteria: Patient will be discharged from OT if patient refuses treatment 3 consecutive times without medical reason, if treatment goals not met, if there is a change in medical status, if patient makes no progress towards goals or if patient is discharged from hospital.  The above assessment, treatment plan, treatment alternatives and goals were discussed and mutually agreed upon: by patient  Mal Amabile 10/31/2022, 10:02 AM

## 2022-10-31 NOTE — Evaluation (Signed)
Physical Therapy Assessment and Plan  Patient Details  Name: Kim Burns MRN: [id number] Date of Birth: 1936-02-06  PT Diagnosis: Difficulty walking and Muscle weakness Rehab Potential: Good ELOS: 10-14 Days   Today's Date: 10/31/2022 PT Individual Time: 0102-7253 PT Individual Time Calculation (min): 72 min    Hospital Problem: Principal Problem:   TBI (traumatic brain injury) (HCC)   Past Medical History:  Past Medical History:  Diagnosis Date   Anemia of chronic disease    takes iron   Anxiety    Blood transfusion without reported diagnosis    Bradycardia    Diabetes mellitus without complication (HCC)    became diabetic after Whipple procedure   Diarrhea    Dysrhythmia 04/16/2016   bradycardia due to medication    ESRD on hemodialysis [facility])    M-W-F   GERD (gastroesophageal reflux disease)    Gout    Headache    History of kidney stones 06/2013   Hyperparathyroidism (HCC)    Hypertension    PONV (postoperative nausea and vomiting)    Sleep apnea    no cpap machine. could not tolerate   Stroke (HCC) 04/16/2016   TIA 1995   Vitamin D deficiency    Past Surgical History:  Past Surgical History:  Procedure Laterality Date   A/V FISTULAGRAM Left 08/30/2021   Procedure: A/V Fistulagram;  Surgeon: Cephus Shelling, MD;  Location: [facility] INVASIVE CV LAB;  Service: Cardiovascular;  Laterality: Left;   ABDOMINAL HYSTERECTOMY  1985   complete   BACK SURGERY  1980   lower   BASCILIC VEIN TRANSPOSITION Left 04/24/2017   Procedure: LEFT ARM FIRST STAGE BASILIC VEIN TRANSPOSITION;  Surgeon: Nada Libman, MD;  Location: [facility] OR;  Service: Vascular;  Laterality: Left;   BASCILIC VEIN TRANSPOSITION Left 07/10/2017   Procedure: SECOND STAGE BASILIC VEIN TRANSPOSITION LEFT ARM;  Surgeon: Nada Libman, MD;  Location: [facility] OR;  Service: Vascular;  Laterality: Left;   BREAST LUMPECTOMY Left x 2   many years apart, benign   CHOLECYSTECTOMY  1985   CYSTOSCOPY W/  URETERAL STENT PLACEMENT Left 10/31/2021   Procedure: CYSTOSCOPY WITH RETROGRADE PYELOGRAM/URETERAL STENT PLACEMENT;  Surgeon: Crist Fat, MD;  Location: [facility] OR;  Service: Urology;  Laterality: Left;   CYSTOSCOPY WITH URETEROSCOPY AND STENT PLACEMENT Left 06/18/2013   Procedure: CYSTOSCOPY WITH Lef URETEROSCOPY AND Left STENT PLACEMENT;  Surgeon: Crecencio Mc, MD;  Location: [facility] ORS;  Service: Urology;  Laterality: Left;   ESOPHAGOGASTRODUODENOSCOPY (EGD) WITH PROPOFOL N/A 11/13/2016   Procedure: ESOPHAGOGASTRODUODENOSCOPY (EGD) WITH PROPOFOL;  Surgeon: Kathi Der, MD;  Location: [facility] ENDOSCOPY;  Service: Gastroenterology;  Laterality: N/A;   EUS N/A 02/07/2016   Procedure: ESOPHAGEAL ENDOSCOPIC ULTRASOUND (EUS) RADIAL;  Surgeon: Willis Modena, MD;  Location: [facility] ENDOSCOPY;  Service: Endoscopy;  Laterality: N/A;   EYE SURGERY Bilateral 2014   ioc for catracts    FOOT SURGERY Left 1990   something with toes unsure what    HERNIA REPAIR  2018   IR CATHETER TUBE CHANGE  03/14/2022   IR EMBO TUMOR ORGAN ISCHEMIA INFARCT INC GUIDE ROADMAPPING  08/22/2021   IR NEPHROSTOMY EXCHANGE LEFT  06/27/2022   IR NEPHROSTOMY EXCHANGE LEFT  08/26/2022   IR NEPHROSTOMY EXCHANGE LEFT  10/22/2022   IR NEPHROSTOMY PLACEMENT LEFT  03/14/2022   IR RADIOLOGIST EVAL & MGMT  05/18/2021   IR RADIOLOGIST EVAL & MGMT  10/01/2021   IR RADIOLOGIST EVAL & MGMT  11/12/2021   IR RADIOLOGIST EVAL &  MGMT  11/22/2021   IR RADIOLOGIST EVAL & MGMT  11/27/2021   IR RADIOLOGIST EVAL & MGMT  12/20/2021   IR RENAL SUPRASEL UNI S&I MOD SED  08/22/2021   IR US GUIDE VASC ACCESS RIGHT  08/22/2021   LEFT HEART CATH AND CORONARY ANGIOGRAPHY N/A 08/27/2021   Procedure: LEFT HEART CATH AND CORONARY ANGIOGRAPHY;  Surgeon: Kathleene Hazel, MD;  Location: [facility] INVASIVE CV LAB;  Service: Cardiovascular;  Laterality: N/A;   PARATHYROID EXPLORATION     PERIPHERAL VASCULAR BALLOON ANGIOPLASTY Left 08/30/2021   Procedure: PERIPHERAL VASCULAR  BALLOON ANGIOPLASTY;  Surgeon: Cephus Shelling, MD;  Location: [facility] INVASIVE CV LAB;  Service: Cardiovascular;  Laterality: Left;  arm fistula   RADIOLOGY WITH ANESTHESIA Left 08/22/2021   Procedure: MICROWAVE ABLATION;  Surgeon: Simonne Come, MD;  Location: [facility] ORS;  Service: Anesthesiology;  Laterality: Left;   WHIPPLE PROCEDURE N/A 04/23/2016   Procedure: WHIPPLE PROCEDURE;  Surgeon: Almond Lint, MD;  Location: [facility] OR;  Service: General;  Laterality: N/A;    Assessment & Plan Clinical Impression: Patient is a 87 year old female with history of HTN, Renal artery embolization, Whipple (non malignant mass), A fib (no AC), ESRD-HD MWF, visual impairment and gait d/o w/falls, HOH who was admitted on 10/21/22 after being found face down on the  floor by son in law who went to pick her up for dialysis.  Work up in ED revealed acute croup contrecoup brain injury with nondisplaced left posterior skull fracture, right anterior frontal lobe hemorrhagic contusion and left posterior scalp hematoma and nondisplaced sacral fracture at S4.  Incidental findings of mildly progressive multilevel cervical spondylolysis, mild to advanced DDD lumbar spine, severe bilateral carotid atherosclerosis and mild wall thickening of distal sigmoid colon--correlate for signs/symptoms of colitis.  Dr. Dutch Quint recommended monitoring in the ICU with serial CT head, hold ASA for a week and avoid heparin with HD.    Serial CT head stable.  MRI brain showed stable size of contrecoup pulmonology contusion, localized edema without significant mass effect, 7 mm signal abnormality suspicious for small acute to early subacute ischemic nonhemorrhagic infarct and small bilateral subdural collections without significant change. Dr. Brock Bad felt that patient with acute ischemic stroke likely cardioembolic due to A-fib and recommended consideration for Watchman procedure as AC contraindicated. Dr. Servando Salina  consulted for input on A-fib with RVR as well  as low blood pressure.  Amiodarone added and metoprolol held briefly then resumed at lower dose as BP stabilized.      Patient with left hip pain due to possible left inferior pubic rami fracture and sacral fracture.  Dr. Deberah Castle recommended weightbearing as tolerated as patient without numbness or tingling in LLE and to follow-up on outpatient basis with X rays in 3 weeks.     Patient reports dizziness mostly when she moves her head.     Has cane/walker but furniture walks in home. Used walking stick when out to dialysis--also use WC/rollater prn.  She reports that her son and daughter can stay with her for assistance after discharge.  Patient transferred to CIR on 10/30/2022 .   Patient currently requires min with mobility secondary to muscle weakness, decreased cardiorespiratoy endurance, decreased memory and demonstrates behaviors consistent with [place], and decreased sitting balance, decreased standing balance, decreased postural control, and decreased balance strategies.  Prior to hospitalization, patient was modified independent  with mobility and lived with Son (daughter stays as well when she's in town) in a House home.  Home access is 4 to get in the houseStairs to enter.  Patient will benefit from skilled PT intervention to maximize safe functional mobility, minimize fall risk, and decrease caregiver burden for planned discharge home with 24 hour supervision.  Anticipate patient will benefit from follow up OP at discharge.  PT - End of Session Activity Tolerance: Tolerates 10 - 20 min activity with multiple rests Endurance Deficit: Yes Endurance Deficit Description: rest breaks with mobility tasks PT Assessment Rehab Potential (ACUTE/IP ONLY): Good PT Barriers to Discharge: Home environment access/layout PT Patient demonstrates impairments in the following area(s): Balance;Endurance;Motor;Pain;Safety PT Transfers Functional Problem(s): Bed Mobility;Bed to  Chair;Car;Furniture PT Locomotion Functional Problem(s): Ambulation;Stairs PT Plan PT Frequency: 5 out of 7 days PT Duration Estimated Length of Stay: 10-14 Days PT Treatment/Interventions: Ambulation/gait training;Community reintegration;DME/adaptive equipment instruction;Neuromuscular re-education;Stair training;UE/LE Strength taining/ROM;Psychosocial support;Balance/vestibular training;Discharge planning;Functional electrical stimulation;Pain management;Skin care/wound management;UE/LE Coordination activities;Therapeutic Activities;Cognitive remediation/compensation;Functional mobility training;Patient/family education;Therapeutic Exercise PT Transfers Anticipated Outcome(s): Supervision PT Locomotion Anticipated Outcome(s): Supervision PT Recommendation Follow Up Recommendations: 24 hour supervision/assistance;Outpatient PT Patient destination: Home Equipment Recommended: To be determined   PT Evaluation Precautions/Restrictions Precautions Precautions: Fall Precaution Comments: dizziness Restrictions LLE Weight Bearing: Weight bearing as tolerated General Chart Reviewed: Yes Family/Caregiver Present: No  Pain Interference Pain Interference Pain Effect on Sleep: 4. Almost constantly Pain Interference with Therapy Activities: 4. Almost constantly Pain Interference with Day-to-Day Activities: 4. Almost constantly Home Living/Prior Functioning Home Living Available Help at Discharge: Family;Available 24 hours/day Type of Home: House Home Access: Stairs to enter [employer] of Steps: 4 to get in the house Entrance Stairs-Rails: Left;Right Home Layout: Able to live on main level with bedroom/bathroom;Laundry or work area in basement Alternate Teacher, music of Steps: ~13 Alternate Level Stairs-Rails: Left Bathroom Shower/Tub: Health visitor: Handicapped height Bathroom Accessibility: Yes  Lives With: Son (daughter stays as well when she's in  town) Prior Function Level of Independence: Independent with basic ADLs;Needs assistance with homemaking;Requires assistive device for independence  Able to Take Stairs?: Yes Vision/Perception  Vision - History Ability to See in Adequate Light: 1 Impaired Perception Perception: Within Functional Limits Praxis Praxis: WFL  Cognition Overall Cognitive Status: Impaired/Different from baseline Arousal/Alertness: Awake/alert Orientation Level: Disoriented to time Attention: Sustained Sustained Attention: Appears intact Memory: Impaired Memory Impairment: Decreased recall of new information;Decreased short term memory Decreased Short Term Memory: Verbal basic;Functional basic Awareness: Appears intact Problem Solving: Impaired Problem Solving Impairment: Functional complex Safety/Judgment: Appears intact [place] [employer] Scales of Cognitive Functioning: Automatic, Appropriate: Minimal Assistance for Daily Living Skills Sensation Sensation Light Touch: Appears Intact Proprioception: Appears Intact Coordination Gross Motor Movements are Fluid and Coordinated: No Fine Motor Movements are Fluid and Coordinated: Yes Motor  Motor Motor: Within Functional Limits Motor - Skilled Clinical Observations: Generalized weakness  Trunk/Postural Assessment  Cervical Assessment Cervical Assessment: Exceptions to [facility] (forward head) Thoracic Assessment Thoracic Assessment: Exceptions to [facility] (rounded shoulders) Lumbar Assessment Lumbar Assessment: Exceptions to [facility] (posterior pelvic tilt) Postural Control Postural Control: Deficits on evaluation  Balance Balance Balance Assessed: Yes Static Sitting Balance Static Sitting - Balance Support: Feet supported Static Sitting - Level of Assistance: 5: Stand by assistance Dynamic Sitting Balance Dynamic Sitting - Balance Support: Feet supported Dynamic Sitting - Level of Assistance: 4: Min assist Static Standing Balance Static Standing -  Balance Support: During functional activity;Bilateral upper extremity supported Static Standing - Level of Assistance: 4: Min assist Dynamic Standing Balance Dynamic Standing - Balance Support: During functional activity;Bilateral upper extremity supported Dynamic Standing -  Level of Assistance: 4: Min assist Extremity Assessment  RLE Assessment RLE Assessment: Exceptions to [facility] General Strength Comments: Grossly 4/5 LLE Assessment LLE Assessment: Exceptions to [facility] General Strength Comments: Grossly 3+/5. Limited by pain in hip.  Care Tool Care Tool Bed Mobility Roll left and right activity   Roll left and right assist level: Minimal Assistance - Patient > 75%    Sit to lying activity   Sit to lying assist level: Minimal Assistance - Patient > 75%    Lying to sitting on side of bed activity   Lying to sitting on side of bed assist level: the ability to move from lying on the back to sitting on the side of the bed with no back support.: Minimal Assistance - Patient > 75%     Care Tool Transfers Sit to stand transfer   Sit to stand assist level: Moderate Assistance - Patient 50 - 74%    Chair/bed transfer   Chair/bed transfer assist level: Moderate Assistance - Patient 50 - 74%     Toilet transfer Toilet transfer activity did not occur: Safety/medical concerns      Car transfer   Car transfer assist level: Moderate Assistance - Patient 50 - 74%      Care Tool Locomotion Ambulation   Assist level: Minimal Assistance - Patient > 75% Assistive device: Walker-rolling Max distance: 45'  Walk 10 feet activity   Assist level: Minimal Assistance - Patient > 75% Assistive device: Walker-rolling   Walk 50 feet with 2 turns activity Walk 50 feet with 2 turns activity did not occur: Safety/medical concerns      Walk 150 feet activity Walk 150 feet activity did not occur: Safety/medical concerns      Walk 10 feet on uneven surfaces activity   Assist level: Minimal Assistance -  Patient > 75% Assistive device: Other (comment) (2 handrails)  Stairs   Assist level: Minimal Assistance - Patient > 75% Stairs assistive device: 2 hand rails Max number of stairs: 4  Walk up/down 1 step activity   Walk up/down 1 step (curb) assist level: Minimal Assistance - Patient > 75% Walk up/down 1 step or curb assistive device: 2 hand rails  Walk up/down 4 steps activity   Walk up/down 4 steps assist level: Minimal Assistance - Patient > 75% Walk up/down 4 steps assistive device: 2 hand rails  Walk up/down 12 steps activity Walk up/down 12 steps activity did not occur: Safety/medical concerns      Pick up small objects from floor   Pick up small object from the floor assist level: Total Assistance - Patient < 25%    Wheelchair Is the patient using a wheelchair?: Yes Type of Wheelchair: Manual   Wheelchair assist level: Dependent - Patient 0% Max wheelchair distance: 150'  Wheel 50 feet with 2 turns activity   Assist Level: Dependent - Patient 0%  Wheel 150 feet activity        Refer to Care Plan for Long Term Goals  SHORT TERM GOAL WEEK 1 PT Short Term Goal 1 (Week 1): Pt will perform bed mobility with CGA. PT Short Term Goal 2 (Week 1): Pt will perform sit to stand with CGA. PT Short Term Goal 3 (Week 1): PT will perform bed to chair with CGA consistently. PT Short Term Goal 4 (Week 1): Pt will ambulate x50' with CGA and LRAD.  Recommendations for other services: Therapeutic Recreation  Other Dance group  Skilled Therapeutic Intervention  Evaluation completed (see details above  and below) with education on PT POC and goals and individual treatment initiated with focus on bed mobility, balance, transfers, ambulation, car training, and stair training. Pt received supine in bed with eyes closed. Pt states she is not asleep but is keeping eyes closed to limit dizziness. Pt does not initially complain of pain but with movement reports pain in Lt hip. Number not provided.  PT provides rest breaks as needed and mobility to manage pain. Pt performs supine to sit slowly with bed features and cues for sequencing and body mechanics. PT given incrased time seated at EOB to acclimate to upright positioning and ensure safety. Sit to stand with modA and cues for hand placement, sequencing, and initiation. Stand step to [facility] with minA and similar cues. WC transport to gym. Pt performs sit to stand with RW and minA/modA, then ambulates x45' with RW and minA, with cues for upright gaze to improve posture and balance, increasing stride length and gait speed to decrease risk for falls, and safe AD management. Pt completes ramp navigation with bilateral handrails and miNA, then car transfer with modA and PT managing LLE. Extended seated rest break. Pt then completes x4 6" steps with bilateral hand rails and cues for step sequencing and safety. WC transport back to room. Pt left seated in WC with alarm intact and all needs within reach.   Mobility Bed Mobility Bed Mobility: Sit to Supine;Supine to Sit Supine to Sit: Minimal Assistance - Patient > 75% Sit to Supine: Minimal Assistance - Patient > 75% Transfers Transfers: Sit to Stand;Stand to Sit;Stand Pivot Transfers Sit to Stand: Minimal Assistance - Patient > 75% Stand to Sit: Minimal Assistance - Patient > 75% Stand Pivot Transfers: Minimal Assistance - Patient > 75% Stand Pivot Transfer Details: Verbal cues for sequencing;Tactile cues for initiation;Tactile cues for sequencing;Verbal cues for technique;Verbal cues for safe use of DME/AE Transfer (Assistive device): Rolling walker Locomotion  Gait Ambulation: Yes Gait Assistance: Minimal Assistance - Patient > 75% Gait Distance (Feet): 45 Feet Assistive device: Rolling walker Gait Assistance Details: Tactile cues for posture;Verbal cues for sequencing;Verbal cues for gait pattern;Verbal cues for technique;Verbal cues for safe use of DME/AE Gait Gait: Yes Gait Pattern:  Impaired Gait Pattern: Decreased stride length;Shuffle;Trunk flexed Stairs / Additional Locomotion Stairs: Yes Stairs Assistance: Minimal Assistance - Patient > 75% Stair Management Technique: Two rails Number of Stairs: 4 Height of Stairs: 6 Ramp: Minimal Assistance - Patient >75% Curb: Minimal Assistance - Patient >75% Wheelchair Mobility Wheelchair Mobility: No   Discharge Criteria: Patient will be discharged from PT if patient refuses treatment 3 consecutive times without medical reason, if treatment goals not met, if there is a change in medical status, if patient makes no progress towards goals or if patient is discharged from hospital.  The above assessment, treatment plan, treatment alternatives and goals were discussed and mutually agreed upon: by patient  Beau Fanny, PT, DPT 10/31/2022, 4:21 PM

## 2022-10-31 NOTE — Plan of Care (Signed)
  Problem: RH Problem Solving Goal: LTG Patient will demonstrate problem solving for (SLP) Description: LTG:  Patient will demonstrate problem solving for basic/complex daily situations with cues  (SLP) Flowsheets (Taken 10/31/2022 1411) LTG: Patient will demonstrate problem solving for (SLP): Basic daily situations LTG Patient will demonstrate problem solving for: Supervision   Problem: RH Memory Goal: LTG Patient will use memory compensatory aids to (SLP) Description: LTG:  Patient will use memory compensatory aids to recall biographical/new, daily complex information with cues (SLP) Flowsheets (Taken 10/31/2022 1411) LTG: Patient will use memory compensatory aids to (SLP): Supervision

## 2022-10-31 NOTE — Progress Notes (Signed)
Inpatient Rehabilitation Care Coordinator Assessment and Plan Patient Details  Name: Kim Burns MRN: [id number] Date of Birth: March 04, 1935  Today's Date: 10/31/2022  Hospital Problems: Principal Problem:   TBI (traumatic brain injury) [facility])  Past Medical History:  Past Medical History:  Diagnosis Date   Anemia of chronic disease    takes iron   Anxiety    Blood transfusion without reported diagnosis    Bradycardia    Diabetes mellitus without complication (HCC)    became diabetic after Whipple procedure   Diarrhea    Dysrhythmia 04/16/2016   bradycardia due to medication    ESRD on hemodialysis [facility])    M-W-F   GERD (gastroesophageal reflux disease)    Gout    Headache    History of kidney stones 06/2013   Hyperparathyroidism (HCC)    Hypertension    PONV (postoperative nausea and vomiting)    Sleep apnea    no cpap machine. could not tolerate   Stroke (HCC) 04/16/2016   TIA 1995   Vitamin D deficiency    Past Surgical History:  Past Surgical History:  Procedure Laterality Date   A/V FISTULAGRAM Left 08/30/2021   Procedure: A/V Fistulagram;  Surgeon: Cephus Shelling, MD;  Location: [facility] INVASIVE CV LAB;  Service: Cardiovascular;  Laterality: Left;   ABDOMINAL HYSTERECTOMY  1985   complete   BACK SURGERY  1980   lower   BASCILIC VEIN TRANSPOSITION Left 04/24/2017   Procedure: LEFT ARM FIRST STAGE BASILIC VEIN TRANSPOSITION;  Surgeon: Nada Libman, MD;  Location: [facility] OR;  Service: Vascular;  Laterality: Left;   BASCILIC VEIN TRANSPOSITION Left 07/10/2017   Procedure: SECOND STAGE BASILIC VEIN TRANSPOSITION LEFT ARM;  Surgeon: Nada Libman, MD;  Location: [facility] OR;  Service: Vascular;  Laterality: Left;   BREAST LUMPECTOMY Left x 2   many years apart, benign   CHOLECYSTECTOMY  1985   CYSTOSCOPY W/ URETERAL STENT PLACEMENT Left 10/31/2021   Procedure: CYSTOSCOPY WITH RETROGRADE PYELOGRAM/URETERAL STENT PLACEMENT;  Surgeon: Crist Fat, MD;   Location: [facility] OR;  Service: Urology;  Laterality: Left;   CYSTOSCOPY WITH URETEROSCOPY AND STENT PLACEMENT Left 06/18/2013   Procedure: CYSTOSCOPY WITH Lef URETEROSCOPY AND Left STENT PLACEMENT;  Surgeon: Crecencio Mc, MD;  Location: [facility] ORS;  Service: Urology;  Laterality: Left;   ESOPHAGOGASTRODUODENOSCOPY (EGD) WITH PROPOFOL N/A 11/13/2016   Procedure: ESOPHAGOGASTRODUODENOSCOPY (EGD) WITH PROPOFOL;  Surgeon: Kathi Der, MD;  Location: [facility] ENDOSCOPY;  Service: Gastroenterology;  Laterality: N/A;   EUS N/A 02/07/2016   Procedure: ESOPHAGEAL ENDOSCOPIC ULTRASOUND (EUS) RADIAL;  Surgeon: Willis Modena, MD;  Location: [facility] ENDOSCOPY;  Service: Endoscopy;  Laterality: N/A;   EYE SURGERY Bilateral 2014   ioc for catracts    FOOT SURGERY Left 1990   something with toes unsure what    HERNIA REPAIR  2018   IR CATHETER TUBE CHANGE  03/14/2022   IR EMBO TUMOR ORGAN ISCHEMIA INFARCT INC GUIDE ROADMAPPING  08/22/2021   IR NEPHROSTOMY EXCHANGE LEFT  06/27/2022   IR NEPHROSTOMY EXCHANGE LEFT  08/26/2022   IR NEPHROSTOMY EXCHANGE LEFT  10/22/2022   IR NEPHROSTOMY PLACEMENT LEFT  03/14/2022   IR RADIOLOGIST EVAL & MGMT  05/18/2021   IR RADIOLOGIST EVAL & MGMT  10/01/2021   IR RADIOLOGIST EVAL & MGMT  11/12/2021   IR RADIOLOGIST EVAL & MGMT  11/22/2021   IR RADIOLOGIST EVAL & MGMT  11/27/2021   IR RADIOLOGIST EVAL & MGMT  12/20/2021   IR RENAL SUPRASEL UNI S&I  MOD SED  08/22/2021   IR US GUIDE VASC ACCESS RIGHT  08/22/2021   LEFT HEART CATH AND CORONARY ANGIOGRAPHY N/A 08/27/2021   Procedure: LEFT HEART CATH AND CORONARY ANGIOGRAPHY;  Surgeon: Kathleene Hazel, MD;  Location: [facility] INVASIVE CV LAB;  Service: Cardiovascular;  Laterality: N/A;   PARATHYROID EXPLORATION     PERIPHERAL VASCULAR BALLOON ANGIOPLASTY Left 08/30/2021   Procedure: PERIPHERAL VASCULAR BALLOON ANGIOPLASTY;  Surgeon: Cephus Shelling, MD;  Location: [facility] INVASIVE CV LAB;  Service: Cardiovascular;  Laterality: Left;  arm fistula   RADIOLOGY  WITH ANESTHESIA Left 08/22/2021   Procedure: MICROWAVE ABLATION;  Surgeon: Simonne Come, MD;  Location: [facility] ORS;  Service: Anesthesiology;  Laterality: Left;   WHIPPLE PROCEDURE N/A 04/23/2016   Procedure: WHIPPLE PROCEDURE;  Surgeon: Almond Lint, MD;  Location: [facility] OR;  Service: General;  Laterality: N/A;   Social History:  reports that she has never smoked. She has never used smokeless tobacco. She reports that she does not drink alcohol and does not use drugs.  Family / Support Systems Marital Status: Widow/Widower How Long?: 2009 Spouse/Significant Other: Widowed Children: 3 children- Nelia Shi and Jacki Cones (no contact) Other Supports: Dtr [name] Anticipated Caregiver: Sundra Aland Ability/Limitations of Caregiver: Pt son reports he will be able to provide 24/7care are discahrge. Intermittent support from her dtr [name] who works as a Runner, broadcasting/film/video in [place] and travels back for [employer] at a time during breaks. Caregiver Availability: 24/7 Family Dynamics: Pt will discharge to home with support from her son [name].  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 No forms on file.   Abuse/Neglect Abuse/Neglect Assessment Can Be Completed: Yes Physical Abuse: Denies Verbal Abuse: Denies Sexual Abuse: Denies Exploitation of patient/patient's resources: Denies Self-Neglect: Denies  Patient response to: Social Isolation - How often do you feel lonely or isolated from those around you?: Never  Emotional  Status Pt's affect, behavior and adjustment status: Pt in good spirits at time of visit. Reports HOH in R ear, and left ear better. Recent Psychosocial Issues: Denies Psychiatric History: Denies Substance Abuse History: Denies  Patient / Family Perceptions, Expectations & Goals Pt/Family understanding of illness & functional limitations: Pta family has a general understanding of pt care needs Premorbid pt/family roles/activities: Assistance with cognition, and indpendent with ADLs/IADLs Anticipated changes in roles/activities/participation: continued assistance with ADLs/IADLs Pt/family expectations/goals: pt goal is to work towards gettign left leg and getting up and down.  Community Resources [employer]: None Premorbid Home Care/DME Agencies: None Transportation available at discharge: son [name] Is the patient able to respond to transportation needs?: Yes In the past 12 months, has lack of transportation kept you from medical appointments or from getting medications?: No In the past 12 months, has lack of transportation kept you from meetings, work, or from getting things needed for daily living?: No Resource referrals recommended: Neuropsychology  Discharge Planning Living Arrangements: Alone Support Systems: Spouse/significant other Type of Residence: Private residence Insurance Resources: Media planner (specify) ([employer] Medicare) Financial Resources: Social Security Financial Screen Referred: No Living Expenses: Own Money Management: Family Does the patient have any problems  obtaining your medications?: No Home Management: Pt son help with preparing meals, and insurance provides meals (20 meals) Patient/Family Preliminary Plans: Son [name] to continue to with support needed. Care Coordinator Barriers to Discharge: Lack of/limited family support, Decreased caregiver support, Insurance for SNF coverage Care Coordinator Anticipated Follow Up Needs: HH/OP  Clinical  Impression SW met with pt in room to introduce self, explain role, and discuss discharge process. Pt is not a Cytogeneticist. HCPOA- son [name]. DME: RW, walking stick, w/c, rollator, shower seat.  Jared Whorley A Catelyn Friel 10/31/2022, 2:57 PM

## 2022-10-31 NOTE — Progress Notes (Signed)
Inpatient Rehabilitation  Patient information reviewed and entered into eRehab system by Melissa M. Bowie, M.A., CCC/SLP, PPS Coordinator.  Information including medical coding, functional ability and quality indicators will be reviewed and updated through discharge.    

## 2022-10-31 NOTE — Progress Notes (Signed)
Inpatient Rehabilitation Admission Medication Review by a Pharmacist  A complete drug regimen review was completed for this patient to identify any potential clinically significant medication issues.  High Risk Drug Classes Is patient taking? Indication by Medication  Antipsychotic Yes Compazine PRN- n/v   Anticoagulant Yes Heparin IK PRN, sodium citrate IK PRN- HD catheter patency   Antibiotic Yes Valtrex - h/o shingles   Opioid No   Antiplatelet Yes bASA - CVA   Hypoglycemics/insulin Yes SSI- IDDM   Vasoactive Medication Yes Amiodarone, Toprol- AF   Chemotherapy No   Other Yes APAP SCH- pain  Lidocaine patch- pain  Ropinirole- RLS  Renvela- hyperphosphatemia  Benadryl- itching  Duonebs PRN- wheezing  Antivert- dizziness  Trazodone PRN- insomnia  Creon- pancreatic insufficiency      Type of Medication Issue Identified Description of Issue Recommendation(s)  Drug Interaction(s) (clinically significant)     Duplicate Therapy     Allergy     No Medication Administration End Date     Incorrect Dose     Additional Drug Therapy Needed     Significant med changes from prior encounter (inform family/care partners about these prior to discharge).    Other  PTA: midodrine 10 mg on HD days, cinacalcet 30 QMWF, colestipol 1g MWF pre-HD and PRN for diarrhea   New: amiodarone, Toprol   Discontinued: Lopressor  Resume at discharge if clinically appropriate- was not receiving any of these medications while inpatient     Clinically significant medication issues were identified that warrant physician communication and completion of prescribed/recommended actions by midnight of the next day:  No  Name of provider notified for urgent issues identified:   Provider Method of Notification:     Pharmacist comments:   Time spent performing this drug regimen review (minutes):  20   Jani Gravel, PharmD Clinical Pharmacist  10/31/2022 7:52 AM

## 2022-10-31 NOTE — Progress Notes (Addendum)
Patient ID: Kim Burns, female   DOB: 07-05-1935, 87 y.o.   MRN: [id number]  70- SW spoke with pt son [name] to introduce self, explain role, and discuss discharge process. He confirms that he will be primary caregiver for pt, and pt sister will travel back and forth from [place] to help assist, and pt sometimes stays with her for a month at a time as she is able to miss 13 dialysis treatments . He expressed concerns about his mother' BP and felt this was going to be addressed on acute before being transferred to rehab. He states pt continues to complain about dizziness and especially when sitting up. He would like to speak with a physician about her BP, dizziness and if this was determined to be vertigo. Would also like to confirm her liquid protocol restriction. Made complaints about dialysis and her having to wait 1.5hrs after she has finished. SW explained would relay the concerns to the dialysis but unsure on if this will change in terms of wait time from finishing until being brought to her room as we have no control over transport.  SW relayed concerns to medical team.   Cecile Sheerer, MSW, LCSWA Office: [phone] Cell: [phone] Fax: [phone]

## 2022-10-31 NOTE — Plan of Care (Signed)
  Problem: RH Balance Goal: LTG: Patient will maintain dynamic sitting balance (OT) Description: LTG:  Patient will maintain dynamic sitting balance with assistance during activities of daily living (OT) Flowsheets (Taken 10/31/2022 0947) LTG: Pt will maintain dynamic sitting balance during ADLs with: Supervision/Verbal cueing Goal: LTG Patient will maintain dynamic standing with ADLs (OT) Description: LTG:  Patient will maintain dynamic standing balance with assist during activities of daily living (OT)  Flowsheets (Taken 10/31/2022 0947) LTG: Pt will maintain dynamic standing balance during ADLs with: Supervision/Verbal cueing   Problem: Sit to Stand Goal: LTG:  Patient will perform sit to stand in prep for activites of daily living with assistance level (OT) Description: LTG:  Patient will perform sit to stand in prep for activites of daily living with assistance level (OT) Flowsheets (Taken 10/31/2022 0947) LTG: PT will perform sit to stand in prep for activites of daily living with assistance level: Supervision/Verbal cueing   Problem: RH Grooming Goal: LTG Patient will perform grooming w/assist,cues/equip (OT) Description: LTG: Patient will perform grooming with assist, with/without cues using equipment (OT) Flowsheets (Taken 10/31/2022 0947) LTG: Pt will perform grooming with assistance level of: Supervision/Verbal cueing   Problem: RH Bathing Goal: LTG Patient will bathe all body parts with assist levels (OT) Description: LTG: Patient will bathe all body parts with assist levels (OT) Flowsheets (Taken 10/31/2022 0947) LTG: Pt will perform bathing with assistance level/cueing: Supervision/Verbal cueing   Problem: RH Dressing Goal: LTG Patient will perform upper body dressing (OT) Description: LTG Patient will perform upper body dressing with assist, with/without cues (OT). Flowsheets (Taken 10/31/2022 0947) LTG: Pt will perform upper body dressing with assistance level of:  Supervision/Verbal cueing Goal: LTG Patient will perform lower body dressing w/assist (OT) Description: LTG: Patient will perform lower body dressing with assist, with/without cues in positioning using equipment (OT) Flowsheets (Taken 10/31/2022 0947) LTG: Pt will perform lower body dressing with assistance level of: Supervision/Verbal cueing   Problem: RH Toileting Goal: LTG Patient will perform toileting task (3/3 steps) with assistance level (OT) Description: LTG: Patient will perform toileting task (3/3 steps) with assistance level (OT)  Flowsheets (Taken 10/31/2022 0947) LTG: Pt will perform toileting task (3/3 steps) with assistance level: Supervision/Verbal cueing   Problem: RH Toilet Transfers Goal: LTG Patient will perform toilet transfers w/assist (OT) Description: LTG: Patient will perform toilet transfers with assist, with/without cues using equipment (OT) Flowsheets (Taken 10/31/2022 0947) LTG: Pt will perform toilet transfers with assistance level of: Supervision/Verbal cueing

## 2022-10-31 NOTE — Progress Notes (Signed)
Patient ID: Kim Burns, female   DOB: [birth year], 87 y.o.   MRN: [id number] Met with the patient to review current situation, rehab process, team conference and plan of care. Discussed secondary risks and medications with dietary modification recommendations. Reviewed wound care and prevention of injury with A-fib. Reviewed care of nephrostomy tube. Patient aware of need to check blood pressure, daily weights, tube care, etc. Continue to follow along to address educational needs to facilitate preparation for discharge. Pamelia Hoit

## 2022-10-31 NOTE — Progress Notes (Signed)
PROGRESS NOTE   Subjective/Complaints: Pt feeling very dizzy this morning. Afraid to get out of bed. Has been trying to eat breakfast but doesn't like the food. Was very dizzy yesterday--"passed out" when up  ROS: Patient denies fever, rash, sore throat, blurred vision,   nausea, vomiting, diarrhea, cough, shortness of breath or chest pain, joint or back/neck pain, headache, or mood change.    Objective:   No results found. Recent Labs    10/30/22 2303 10/31/22 0804  WBC 7.6 8.6  HGB 11.7* 11.2*  HCT 35.1* 34.2*  PLT 248 265   Recent Labs    10/31/22 0019 10/31/22 0804  NA 133* 132*  K 3.3* 3.3*  CL 95* 95*  CO2 26 26  GLUCOSE 104* 101*  BUN 16 21  CREATININE 2.59* 2.85*  CALCIUM 8.2* 8.3*    Intake/Output Summary (Last 24 hours) at 10/31/2022 1045 Last data filed at 10/31/2022 1028 Gross per 24 hour  Intake 236 ml  Output --  Net 236 ml     Pressure Injury 10/21/22 Buttocks Right Deep Tissue Pressure Injury - Purple or maroon localized area of discolored intact skin or blood-filled blister due to damage of underlying soft tissue from pressure and/or shear. 2 areas of deep tissue pressure in (Active)  10/21/22 1145  Location: Buttocks  Location Orientation: Right  Staging: Deep Tissue Pressure Injury - Purple or maroon localized area of discolored intact skin or blood-filled blister due to damage of underlying soft tissue from pressure and/or shear.  Wound Description (Comments): 2 areas of deep tissue pressure injuries  Present on Admission: Yes     Pressure Injury 10/21/22 Buttocks Left Stage 2 -  Partial thickness loss of dermis presenting as a shallow open injury with a red, pink wound bed without slough. (Active)  10/21/22 1145  Location: Buttocks  Location Orientation: Left  Staging: Stage 2 -  Partial thickness loss of dermis presenting as a shallow open injury with a red, pink wound bed without  slough.  Wound Description (Comments):   Present on Admission: Yes     Pressure Injury 10/21/22 Arm Lower;Posterior;Right Deep Tissue Pressure Injury - Purple or maroon localized area of discolored intact skin or blood-filled blister due to damage of underlying soft tissue from pressure and/or shear. (Active)  10/21/22 1230  Location: Arm  Location Orientation: Lower;Posterior;Right  Staging: Deep Tissue Pressure Injury - Purple or maroon localized area of discolored intact skin or blood-filled blister due to damage of underlying soft tissue from pressure and/or shear.  Wound Description (Comments):   Present on Admission: Yes    Physical Exam: Vital Signs Blood pressure (!) 154/75, pulse 68, temperature 98.5 F (36.9 C), temperature source Oral, resp. rate 18, height 5\' 3"  (1.6 m), weight 55.9 kg, SpO2 99%.  General: Alert and oriented x 3, Looks uncomfortable HEENT: Head is normocephalic, atraumatic, PERRLA, EOMI, sclera anicteric, oral mucosa pink and moist, dentition intact, ext ear canals clear,  Neck: Supple without JVD or lymphadenopathy Heart: Reg rate and rhythm. No murmurs rubs or gallops Chest: CTA bilaterally without wheezes, rales, or rhonchi; no distress Abdomen: Soft, non-tender, non-distended, bowel sounds positive. Extremities: No clubbing, cyanosis, trace LLE  edema. Pulses are 2+ Psych: Pt's affect is appropriate. Pt is cooperative Skin: bruising on UE's, LUE AVF Neuro:  Alert and oriented x 3. Normal insight and awareness. Functional Memory. Normal language and speech. Cranial nerve exam unremarkable. MMT: RUE 4/5, LUE 4/5. RLE 3/5 prox to 4/5 distally. LLE 1/5 hip to 4/5 ADF/PF. Sensory exam normal for light touch and pain in all 4 limbs. No limb ataxia or cerebellar signs. No abnormal tone appreciated.  .   Musculoskeletal: Left hip tender with palpation and attempts at PROM. Left chest wall sore too    Assessment/Plan: 1. Functional deficits which require 3+  hours per day of interdisciplinary therapy in a comprehensive inpatient rehab setting. Physiatrist is providing close team supervision and 24 hour management of active medical problems listed below. Physiatrist and rehab team continue to assess barriers to discharge/monitor patient progress toward functional and medical goals  Care Tool:  Bathing    Body parts bathed by patient: Right arm, Chest, Left arm, Abdomen, Front perineal area, Buttocks, Face   Body parts bathed by helper: Right upper leg, Left upper leg, Right lower leg, Left lower leg     Bathing assist Assist Level: Maximal Assistance - Patient 24 - 49%     Upper Body Dressing/Undressing Upper body dressing   What is the patient wearing?: Pull over shirt    Upper body assist Assist Level: Minimal Assistance - Patient > 75%    Lower Body Dressing/Undressing Lower body dressing      What is the patient wearing?: Pants     Lower body assist Assist for lower body dressing: Maximal Assistance - Patient 25 - 49%     Toileting Toileting Toileting Activity did not occur (Clothing management and hygiene only): N/A (no void or bm)  Toileting assist       Transfers Chair/bed transfer  Transfers assist  Chair/bed transfer activity did not occur: Safety/medical concerns (due to dizziness)        Locomotion Ambulation   Ambulation assist              Walk 10 feet activity   Assist           Walk 50 feet activity   Assist           Walk 150 feet activity   Assist           Walk 10 feet on uneven surface  activity   Assist           Wheelchair     Assist               Wheelchair 50 feet with 2 turns activity    Assist            Wheelchair 150 feet activity     Assist          Blood pressure (!) 154/75, pulse 68, temperature 98.5 F (36.9 C), temperature source Oral, resp. rate 18, height 5\' 3"  (1.6 m), weight 55.9 kg, SpO2 99%.  Medical  Problem List and Plan: 1. Functional deficits secondary to TBI and polytrauma due to a fall with associated Croop contrecoup brain injury, right frontal lobe hemorrhagic contusion and right frontal ischemic nonhemorrhagic infarct.             -patient may shower             -ELOS/Goals: 7-10 days, PT/OT/SLP mod I to supervision             -  Patient is beginning CIR therapies today including PT, OT, and SLP  2.  Antithrombotics: -DVT/anticoagulation:  Mechanical: Sequential compression devices, below knee Bilateral lower extremities             -antiplatelet therapy: ASA 3. Pain Management: Tylenol prn.  4. Mood/Behavior/Sleep: LCSW to follow for evaluation and support.              -antipsychotic agents: N/A             --chronic insomnia. Naps on and off throughout the day. 5. Neuropsych/cognition: This patient is not fully capable of making decisions on her own behalf. 6. Skin/Wound Care: Routine pressure relief measures.  7. Fluids/Electrolytes/Nutrition: Strict I/O. Change to renal diet. Continue 1500 cc FR. -hypokalemia--will defer to renal team for replacement  8. A fib w/RVR/Acute systolic CHF: Fluid overload due to A fib and managed with HD.              --Duo nebs prn added   for SOB? 1500 cc FR/monitor weights daily.              --monitor HR TID--on amiodarone taper. Continue metoprolol Filed Weights   10/30/22 2112 10/31/22 0602  Weight: 55.4 kg 55.9 kg     9. HTN: Monitor BP TID--on metoprolol bid.  Monitor with mobility  9/12 controlled 10. ESRD: HD MWF at the end of the day to help with tolerance of therapy             --hx of intradialysis hypotension (midodrine in the past) 11. Pancreatic insufficiency s/p Whipple: Continue Creon with meals  BID ac             --has dumping syndrome (abdominal pain with meals followed by diarrhea at times) 12. Left pubic rami/Sacral Ala at S4: WBAT LLE per Dr. Thad Ranger. Follow up Xrays in 2 weeks. 13. L-renal mass (likely RCC) urine  leak s/p chronic nephrostomy tube: Tube exchanged out on 09/03 by IR per protocol             --not on AC due to bleeding from nephrostomy tube/kidney.  Measure output daily.   14. T2DM diet controlled w/peripheral neuropathy: Monitor BS ac/hs and use SSI for elevated BS.  --Hgb A1C- 6.1. will add CM restrictions.  15. Likely BPPV-- unable to do any basic testing d/t dizziness/sx today 9/12 -vestibular eval ordered--hopefully she can tolerate               --also check orthostatic vitals.       -changed meclizine to scheduled for now 16. Anemia of CKD: Monitor with serial checks. H/H stable in 10 range.  17. H/o Shingles w/optic nerve involvement: On Valtrex.  18. Restless leg syndrome: On Requip 0.5 mg HS with additional 0.25 mg prn during the day   -I reordered this today for pt    LOS: 1 days A FACE TO FACE EVALUATION WAS PERFORMED  Ranelle Oyster 10/31/2022, 10:45 AM

## 2022-10-31 NOTE — Evaluation (Signed)
Speech Language Pathology Assessment and Plan  Patient Details  Name: Kim Burns MRN: [id number] Date of Birth: January 16, 1936  SLP Diagnosis: Cognitive Impairments  Rehab Potential: Good ELOS: 10-14 days    Today's Date: 10/31/2022 SLP Individual Time: 6440-3474 SLP Individual Time Calculation (min): 57 min   Hospital Problem: Principal Problem:   TBI (traumatic brain injury) (HCC)  Past Medical History:  Past Medical History:  Diagnosis Date   Anemia of chronic disease    takes iron   Anxiety    Blood transfusion without reported diagnosis    Bradycardia    Diabetes mellitus without complication (HCC)    became diabetic after Whipple procedure   Diarrhea    Dysrhythmia 04/16/2016   bradycardia due to medication    ESRD on hemodialysis [facility])    M-W-F   GERD (gastroesophageal reflux disease)    Gout    Headache    History of kidney stones 06/2013   Hyperparathyroidism (HCC)    Hypertension    PONV (postoperative nausea and vomiting)    Sleep apnea    no cpap machine. could not tolerate   Stroke (HCC) 04/16/2016   TIA 1995   Vitamin D deficiency    Past Surgical History:  Past Surgical History:  Procedure Laterality Date   A/V FISTULAGRAM Left 08/30/2021   Procedure: A/V Fistulagram;  Surgeon: Cephus Shelling, MD;  Location: [facility] INVASIVE CV LAB;  Service: Cardiovascular;  Laterality: Left;   ABDOMINAL HYSTERECTOMY  1985   complete   BACK SURGERY  1980   lower   BASCILIC VEIN TRANSPOSITION Left 04/24/2017   Procedure: LEFT ARM FIRST STAGE BASILIC VEIN TRANSPOSITION;  Surgeon: Nada Libman, MD;  Location: [facility] OR;  Service: Vascular;  Laterality: Left;   BASCILIC VEIN TRANSPOSITION Left 07/10/2017   Procedure: SECOND STAGE BASILIC VEIN TRANSPOSITION LEFT ARM;  Surgeon: Nada Libman, MD;  Location: [facility] OR;  Service: Vascular;  Laterality: Left;   BREAST LUMPECTOMY Left x 2   many years apart, benign   CHOLECYSTECTOMY  1985   CYSTOSCOPY W/ URETERAL  STENT PLACEMENT Left 10/31/2021   Procedure: CYSTOSCOPY WITH RETROGRADE PYELOGRAM/URETERAL STENT PLACEMENT;  Surgeon: Crist Fat, MD;  Location: [facility] OR;  Service: Urology;  Laterality: Left;   CYSTOSCOPY WITH URETEROSCOPY AND STENT PLACEMENT Left 06/18/2013   Procedure: CYSTOSCOPY WITH Lef URETEROSCOPY AND Left STENT PLACEMENT;  Surgeon: Crecencio Mc, MD;  Location: [facility] ORS;  Service: Urology;  Laterality: Left;   ESOPHAGOGASTRODUODENOSCOPY (EGD) WITH PROPOFOL N/A 11/13/2016   Procedure: ESOPHAGOGASTRODUODENOSCOPY (EGD) WITH PROPOFOL;  Surgeon: Kathi Der, MD;  Location: [facility] ENDOSCOPY;  Service: Gastroenterology;  Laterality: N/A;   EUS N/A 02/07/2016   Procedure: ESOPHAGEAL ENDOSCOPIC ULTRASOUND (EUS) RADIAL;  Surgeon: Willis Modena, MD;  Location: [facility] ENDOSCOPY;  Service: Endoscopy;  Laterality: N/A;   EYE SURGERY Bilateral 2014   ioc for catracts    FOOT SURGERY Left 1990   something with toes unsure what    HERNIA REPAIR  2018   IR CATHETER TUBE CHANGE  03/14/2022   IR EMBO TUMOR ORGAN ISCHEMIA INFARCT INC GUIDE ROADMAPPING  08/22/2021   IR NEPHROSTOMY EXCHANGE LEFT  06/27/2022   IR NEPHROSTOMY EXCHANGE LEFT  08/26/2022   IR NEPHROSTOMY EXCHANGE LEFT  10/22/2022   IR NEPHROSTOMY PLACEMENT LEFT  03/14/2022   IR RADIOLOGIST EVAL & MGMT  05/18/2021   IR RADIOLOGIST EVAL & MGMT  10/01/2021   IR RADIOLOGIST EVAL & MGMT  11/12/2021   IR RADIOLOGIST EVAL & MGMT  11/22/2021   IR RADIOLOGIST EVAL & MGMT  11/27/2021   IR RADIOLOGIST EVAL & MGMT  12/20/2021   IR RENAL SUPRASEL UNI S&I MOD SED  08/22/2021   IR US GUIDE VASC ACCESS RIGHT  08/22/2021   LEFT HEART CATH AND CORONARY ANGIOGRAPHY N/A 08/27/2021   Procedure: LEFT HEART CATH AND CORONARY ANGIOGRAPHY;  Surgeon: Kathleene Hazel, MD;  Location: [facility] INVASIVE CV LAB;  Service: Cardiovascular;  Laterality: N/A;   PARATHYROID EXPLORATION     PERIPHERAL VASCULAR BALLOON ANGIOPLASTY Left 08/30/2021   Procedure: PERIPHERAL VASCULAR BALLOON  ANGIOPLASTY;  Surgeon: Cephus Shelling, MD;  Location: [facility] INVASIVE CV LAB;  Service: Cardiovascular;  Laterality: Left;  arm fistula   RADIOLOGY WITH ANESTHESIA Left 08/22/2021   Procedure: MICROWAVE ABLATION;  Surgeon: Simonne Come, MD;  Location: [facility] ORS;  Service: Anesthesiology;  Laterality: Left;   WHIPPLE PROCEDURE N/A 04/23/2016   Procedure: WHIPPLE PROCEDURE;  Surgeon: Almond Lint, MD;  Location: [facility] OR;  Service: General;  Laterality: N/A;    Assessment / Plan / Recommendation Clinical Impression Patient is an 87 year old female with medical hx significant for: Whipple procedure, 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  Patient presented to [facility] on 10/21/22 d/t being found down by family at home after apparent LOC. Imaging showed right frontal hemorrhagic contusion, a nondisplaced left parietal and occipital fracture, left posterior scalp hematoma, small nondisplaced S4 fracture. Neurosurgery consulted. Repeat CT showed stable right frontal contusion/hematoma. Cardiology consulted on 9/4 d/t A-fib with RVR. Started patient on amiodarone drip. Patient converted to sinus rhythm on 9/5, back in A-fib on 9/6, converted to sinus rhythm on 9/7. Due increased confusion on 9/4,  a repeat CT on 9/5 showed stable hemorrhagic contusion and two small hygromas. Neurosurgery consulted and did not recommend surgical intervention. MRI on 9/6 was suspicious for small acute to early subacute ischemic nonhemorrhagic infarct. Possible additional punctate infarct involving anterior right frontal lobe. Orthopedics consulted on 9/7 d/t pt c/o left hip pain. Imaging showed possible left inferior pubic rami fracture as well as small distal sacral fracture. No surgical intervention warranted and pt allowed to weightbear as tolerated. Therapy evaluations completed with recommendations for  CIR. Patient admitted 10/30/22.  Patient demonstrates behaviors consistent with a Rancho Level VII and demonstrates mild to moderate impairments in short-term recall and problem solving which impacts her safety with functional and familiar tasks. SLP administered the [facility] Mental Status Examination (SLUMS). Patient scored  23/30 points with a score of 27 or above considered normal with deficits noted in short-term recall. Patient's overall auditory comprehension and word-finding appeared [facility] for tasks assessed. However, patient with intermittent phonemic paraphasias which patient independently self-monitored and corrected. Patient transferred back to bed at end of session per her request and required step by step instructions and extra time to complete task.  Patient would benefit from skilled SLP intervention to maximize her cognitive functioning and overall functional independence prior to discharge.    Skilled Therapeutic Interventions          Administered a cognitive-linguistic evaluation, please see above fore details.   SLP Assessment  Patient will need skilled Speech Lanaguage Pathology Services during CIR admission    Recommendations  Oral Care Recommendations: Oral care BID Recommendations for Other Services: Neuropsych consult Patient destination: Home Follow up Recommendations: Home Health SLP;24 hour supervision/assistance Equipment Recommended: None recommended  by SLP    SLP Frequency 3 to 5 out of 7 days   SLP Duration  SLP Intensity  SLP Treatment/Interventions 10-14 days  Minumum of 1-2 x/day, 30 to 90 minutes  Cognitive remediation/compensation;Cueing hierarchy;Dysphagia/aspiration precaution training;Environmental controls;Functional tasks;Internal/external aids;Patient/family education;Therapeutic Activities    Pain Intermittent pain in left leg with mobility Patient premedicated and repositioned   Prior Functioning Type of Home: House  Lives  With: Son (daughter stays as well when she's in town) Available Help at Discharge: Family;Available 24 hours/day  SLP Evaluation Cognition Overall Cognitive Status: Impaired/Different from baseline Arousal/Alertness: Awake/alert Orientation Level: Disoriented to time Attention: Sustained Sustained Attention: Appears intact Memory: Impaired Memory Impairment: Decreased recall of new information;Decreased short term memory Decreased Short Term Memory: Verbal basic;Functional basic Awareness: Appears intact Problem Solving: Impaired Problem Solving Impairment: Functional complex Safety/Judgment: Appears intact Ranch[place] [employer] Scales of Cognitive Functioning: Automatic, Appropriate: Minimal Assistance for Daily Living Skills  Comprehension Auditory Comprehension Overall Auditory Comprehension: Appears within functional limits for tasks assessed Expression Verbal Expression Overall Verbal Expression: Appears within functional limits for tasks assessed Written Expression Dominant Hand: Right Oral Motor Oral Motor/Sensory Function Overall Oral Motor/Sensory Function: Within functional limits Motor Speech Overall Motor Speech: Impaired Motor Speech Errors: Aware  Care Tool Care Tool Cognition Ability to hear (with hearing aid or hearing appliances if normally used Ability to hear (with hearing aid or hearing appliances if normally used): 1. Minimal difficulty - difficulty in some environments (e.g. when person speaks softly or setting is noisy)   Expression of Ideas and Wants Expression of Ideas and Wants: 3. Some difficulty - exhibits some difficulty with expressing needs and ideas (e.g, some words or finishing thoughts) or speech is not clear   Understanding Verbal and Non-Verbal Content Understanding Verbal and Non-Verbal Content: 3. Usually understands - understands most conversations, but misses some part/intent of message. Requires cues at times to understand  Memory/Recall  Ability Memory/Recall Ability : Current season;That he or she is in a hospital/hospital unit   Short Term Goals: Week 1: SLP Short Term Goal 1 (Week 1): Patient will utilize memory compensatory strategies to recall daily, functional information with Min verbal cues. SLP Short Term Goal 2 (Week 1): Patient will utilize external aids for recall of date with Mod I. SLP Short Term Goal 3 (Week 1): Patient will demonstrate functional problem solving for basic and familiar tasks with Min verbal cues.  Refer to Care Plan for Long Term Goals  Recommendations for other services: Neuropsych  Discharge Criteria: Patient will be discharged from SLP if patient refuses treatment 3 consecutive times without medical reason, if treatment goals not met, if there is a change in medical status, if patient makes no progress towards goals or if patient is discharged from hospital.  The above assessment, treatment plan, treatment alternatives and goals were discussed and mutually agreed upon: by patient  Armando Bukhari 10/31/2022, 2:12 PM

## 2022-11-01 DIAGNOSIS — H8110 Benign paroxysmal vertigo, unspecified ear: Secondary | ICD-10-CM | POA: Diagnosis not present

## 2022-11-01 DIAGNOSIS — G2581 Restless legs syndrome: Secondary | ICD-10-CM

## 2022-11-01 DIAGNOSIS — N186 End stage renal disease: Secondary | ICD-10-CM | POA: Diagnosis not present

## 2022-11-01 DIAGNOSIS — Z8619 Personal history of other infectious and parasitic diseases: Secondary | ICD-10-CM

## 2022-11-01 LAB — RENAL FUNCTION PANEL
Albumin: 2 g/dL — ABNORMAL LOW (ref 3.5–5.0)
Anion gap: 13 (ref 5–15)
BUN: 38 mg/dL — ABNORMAL HIGH (ref 8–23)
CO2: 26 mmol/L (ref 22–32)
Calcium: 8.2 mg/dL — ABNORMAL LOW (ref 8.9–10.3)
Chloride: 94 mmol/L — ABNORMAL LOW (ref 98–111)
Creatinine, Ser: 3.58 mg/dL — ABNORMAL HIGH (ref 0.44–1.00)
GFR, Estimated: 12 mL/min — ABNORMAL LOW (ref >60)
Glucose, Bld: 81 mg/dL (ref 70–99)
Phosphorus: 3.8 mg/dL (ref 2.5–4.6)
Potassium: 3.5 mmol/L (ref 3.5–5.1)
Sodium: 133 mmol/L — ABNORMAL LOW (ref 135–145)

## 2022-11-01 LAB — CBC
HCT: 33.4 % — ABNORMAL LOW (ref 36.0–46.0)
Hemoglobin: 10.8 g/dL — ABNORMAL LOW (ref 12.0–15.0)
MCH: 31.4 pg (ref 26.0–34.0)
MCHC: 32.3 g/dL (ref 30.0–36.0)
MCV: 97.1 fL (ref 80.0–100.0)
Platelets: 241 10*3/uL (ref 150–400)
RBC: 3.44 MIL/uL — ABNORMAL LOW (ref 3.87–5.11)
RDW: 17 % — ABNORMAL HIGH (ref 11.5–15.5)
WBC: 7.7 10*3/uL (ref 4.0–10.5)
nRBC: 0 % (ref 0.0–0.2)

## 2022-11-01 LAB — IRON AND TIBC
Iron: 94 ug/dL (ref 28–170)
Saturation Ratios: 71 % — ABNORMAL HIGH (ref 10.4–31.8)
TIBC: 132 ug/dL — ABNORMAL LOW (ref 250–450)
UIBC: 38 ug/dL

## 2022-11-01 LAB — GLUCOSE, CAPILLARY
Glucose-Capillary: 282 mg/dL — ABNORMAL HIGH (ref 70–99)
Glucose-Capillary: 93 mg/dL (ref 70–99)
Glucose-Capillary: 120 mg/dL — ABNORMAL HIGH (ref 70–99)
Glucose-Capillary: 69 mg/dL — ABNORMAL LOW (ref 70–99)
Glucose-Capillary: 142 mg/dL — ABNORMAL HIGH (ref 70–99)

## 2022-11-01 MED ORDER — LIDOCAINE-PRILOCAINE 2.5-2.5 % EX CREA: 1.0000 | TOPICAL_CREAM | CUTANEOUS | Status: DC | PRN

## 2022-11-01 MED ORDER — MECLIZINE HCL 25 MG PO TABS
25.0000 mg | ORAL_TABLET | Freq: Three times a day (TID) | ORAL | Status: DC
  Administered 2022-11-01 – 2022-11-07 (×15): 25 mg via ORAL
  Filled 2022-11-01 (×15): qty 1

## 2022-11-01 MED ORDER — HEPARIN SODIUM (PORCINE) 1000 UNIT/ML DIALYSIS: 1000.0000 [IU] | INTRAMUSCULAR | Status: DC | PRN

## 2022-11-01 MED ORDER — ANTICOAGULANT SODIUM CITRATE 4% (200MG/5ML) IV SOLN: 5.0000 mL | Status: DC | PRN

## 2022-11-01 MED ORDER — PROSOURCE PLUS PO LIQD
30.0000 mL | Freq: Two times a day (BID) | ORAL | Status: DC
  Administered 2022-11-01 – 2022-11-15 (×3): 30 mL via ORAL
  Filled 2022-11-01 (×9): qty 30

## 2022-11-01 MED ORDER — DOXERCALCIFEROL 4 MCG/2ML IV SOLN
6.0000 ug | INTRAVENOUS | Status: DC
  Administered 2022-11-01 – 2022-11-15 (×6): 6 ug via INTRAVENOUS
  Filled 2022-11-01 (×13): qty 4

## 2022-11-01 MED ORDER — PENTAFLUOROPROP-TETRAFLUOROETH EX AERO: 1.0000 | INHALATION_SPRAY | CUTANEOUS | Status: DC | PRN

## 2022-11-01 MED ORDER — LIDOCAINE HCL (PF) 1 % IJ SOLN: 5.0000 mL | INTRAMUSCULAR | Status: DC | PRN

## 2022-11-01 MED ORDER — ALTEPLASE 2 MG IJ SOLR: 2.0000 mg | Freq: Once | INTRAMUSCULAR | Status: DC | PRN

## 2022-11-01 NOTE — Progress Notes (Signed)
Physical Therapy Session Note  Patient Details  Name: Kim Burns MRN: [id number] Date of Birth: [birth year]  Today's Date: 11/01/2022 PT Individual Time: 0950-1029 PT Individual Time Calculation (min): 39 min  and Today's Date: 11/01/2022 PT Missed Time: 30 Minutes Missed Time Reason: Unavailable (Comment) (Dialysis)  Short Term Goals: Week 1:  PT Short Term Goal 1 (Week 1): Pt will perform bed mobility with CGA. PT Short Term Goal 2 (Week 1): Pt will perform sit to stand with CGA. PT Short Term Goal 3 (Week 1): PT will perform bed to chair with CGA consistently. PT Short Term Goal 4 (Week 1): Pt will ambulate x50' with CGA and LRAD.  Skilled Therapeutic Interventions/Progress Updates:      1st Session: Pt received supine in bed and agrees to therapy. Reports pain in Lt hip. Number not provided. PT provides rest breaks as needed to manage pain. Pt performs supine to sit slowly with verbal cues for body mechanics and sequencing. Pt encouraged to take extended time at EOB to acclimate to upright positioning, especially due to pt's report of dizziness and vertigo upon sitting up. Pt then performs stand step transfer to [facility] with minA and cues for initiation, hand placement, and sequencing. WC transport to gym. Pt stands with RW and minA, with cues for anterior weight shifting and hand placement, then ambulates x35' with RW and CGA. Pt ambulates extremely slowly (~2:00 for approximately 10' and appears to be in significant discomfort. RN notified who verbalizes that she will bring pt pain medicine following sessin. WC transport back to room. Pt left seated in WC with alarm intact and all needs within reach.   2nd Session: Pt unable to be seen due to being off floor for dialysis. PT will follow up as able.   Therapy Documentation Precautions:  Precautions Precautions: Fall Precaution Comments: dizziness Restrictions Weight Bearing Restrictions: Yes LLE Weight Bearing: Weight bearing as  tolerated   Therapy/Group: Individual Therapy  Beau Fanny, PT, DPT 11/01/2022, 3:32 PM

## 2022-11-01 NOTE — Progress Notes (Signed)
Received patient in bed to unit.  Alert and oriented.  Informed consent signed and in chart.   TX duration:3.5  Patient tolerated well.  Transported back to the room  Alert, without acute distress.  Hand-off given to patient's nurse.   Access used: right AVF Access issues: none  Total UF removed: 2.2L Medication(s) given: hectorol, robitussin    11/01/22 1726  Vitals  BP (!) 143/64  MAP (mmHg) 85  BP Location Right Arm  BP Method Automatic  Patient Position (if appropriate) Lying  Pulse Rate (!) 56  Pulse Rate Source Monitor  ECG Heart Rate (!) 55  Resp 17  Oxygen Therapy  SpO2 100 %  O2 Device Room Air  During Treatment Monitoring  Blood Flow Rate (mL/min) 399 mL/min  Arterial Pressure (mmHg) -168.88 mmHg  Venous Pressure (mmHg) 243.63 mmHg  TMP (mmHg) -13.94 mmHg  Ultrafiltration Rate (mL/min) 0 mL/min  Dialysate Flow Rate (mL/min) 250 ml/min  Dialysate Potassium Concentration 3  Dialysate Calcium Concentration 2.5  Duration of HD Treatment -hour(s) 3.46 hour(s)  Cumulative Fluid Removed (mL) per Treatment  2204.62  HD Safety Checks Performed Yes  Intra-Hemodialysis Comments Tx completed  Dialysis Fluid Bolus Normal Saline  Bolus Amount (mL) 300 mL      [facility]

## 2022-11-01 NOTE — Care Management (Signed)
Inpatient Rehabilitation Center Individual Statement of Services  Patient Name:  Kim Burns  Date:  11/01/2022  Welcome to the Inpatient Rehabilitation Center.  Our goal is to provide you with an individualized program based on your diagnosis and situation, designed to meet your specific needs.  With this comprehensive rehabilitation program, you will be expected to participate in at least 3 hours of rehabilitation therapies Monday-Friday, with modified therapy programming on the weekends.  Your rehabilitation program will include the following services:  Physical Therapy (PT), Occupational Therapy (OT), Speech Therapy (ST), 24 hour per day rehabilitation nursing, Therapeutic Recreaction (TR), Psychology, Neuropsychology, Care Coordinator, Rehabilitation Medicine, Nutrition Services, Pharmacy Services, and Other  Weekly team conferences will be held on Tuesdays to discuss your progress.  Your Inpatient Rehabilitation Care Coordinator will talk with you frequently to get your input and to update you on team discussions.  Team conferences with you and your family in attendance may also be held.  Expected length of stay: 10-21 days  Overall anticipated outcome: Supervision  Depending on your progress and recovery, your program may change. Your Inpatient Rehabilitation Care Coordinator will coordinate services and will keep you informed of any changes. Your Inpatient Rehabilitation Care Coordinator's name and contact numbers are listed  below.  The following services may also be recommended but are not provided by the Inpatient Rehabilitation Center:  Driving Evaluations Home Health Rehabiltiation Services Outpatient Rehabilitation Services Vocational Rehabilitation   Arrangements will be made to provide these services after discharge if needed.  Arrangements include referral to agencies that provide these services.  Your insurance has been verified to be:  [employer]  Your primary  doctor is:  Maryelizabeth Rowan  Pertinent information will be shared with your doctor and your insurance company.  Inpatient Rehabilitation Care Coordinator:  Susie Cassette [phone] or ([phone]  Information discussed with and copy given to patient by: Gretchen Short, 11/01/2022, 12:27 PM

## 2022-11-01 NOTE — Progress Notes (Signed)
PROGRESS NOTE   Subjective/Complaints: Dizziness worse today, discussed that Dr. Riley Kill placed order for vestibuar eval, meclizine increased to 25mg  daily, did manage to do a lot with therapy yesterday!  ROS: Patient denies fever, rash, sore throat, blurred vision,   nausea, vomiting, diarrhea, cough, shortness of breath or chest pain, joint or back/neck pain, headache, or mood change.    Objective:   No results found. Recent Labs    10/30/22 2303 10/31/22 0804  WBC 7.6 8.6  HGB 11.7* 11.2*  HCT 35.1* 34.2*  PLT 248 265   Recent Labs    10/31/22 0019 10/31/22 0804  NA 133* 132*  K 3.3* 3.3*  CL 95* 95*  CO2 26 26  GLUCOSE 104* 101*  BUN 16 21  CREATININE 2.59* 2.85*  CALCIUM 8.2* 8.3*    Intake/Output Summary (Last 24 hours) at 11/01/2022 1113 Last data filed at 11/01/2022 0900 Gross per 24 hour  Intake 594 ml  Output 40 ml  Net 554 ml     Pressure Injury 10/21/22 Buttocks Right Deep Tissue Pressure Injury - Purple or maroon localized area of discolored intact skin or blood-filled blister due to damage of underlying soft tissue from pressure and/or shear. 2 areas of deep tissue pressure in (Active)  10/21/22 1145  Location: Buttocks  Location Orientation: Right  Staging: Deep Tissue Pressure Injury - Purple or maroon localized area of discolored intact skin or blood-filled blister due to damage of underlying soft tissue from pressure and/or shear.  Wound Description (Comments): 2 areas of deep tissue pressure injuries  Present on Admission: Yes    Physical Exam: Vital Signs Blood pressure (!) 149/68, pulse 68, temperature 97.8 F (36.6 C), resp. rate 17, height 5\' 3"  (1.6 m), weight 55.9 kg, SpO2 100%.  Gen: no distress, normal appearing HEENT: oral mucosa pink and moist, NCAT Cardio: Reg rate Chest: normal effort, normal rate of breathing Abd: soft, non-distended Extremities: No clubbing, cyanosis,  trace LLE edema. Pulses are 2+ Psych: Pt's affect is appropriate. Pt is cooperative Skin: bruising on UE's, LUE AVF Neuro:  Alert and oriented x 3. Normal insight and awareness. Functional Memory. Normal language and speech. Cranial nerve exam unremarkable. MMT: RUE 4/5, LUE 4/5. RLE 3/5 prox to 4/5 distally. LLE 1/5 hip to 4/5 ADF/PF. Sensory exam normal for light touch and pain in all 4 limbs. No limb ataxia or cerebellar signs. No abnormal tone appreciated.  .   Musculoskeletal: Left hip tender with palpation and attempts at PROM. Left chest wall sore too    Assessment/Plan: 1. Functional deficits which require 3+ hours per day of interdisciplinary therapy in a comprehensive inpatient rehab setting. Physiatrist is providing close team supervision and 24 hour management of active medical problems listed below. Physiatrist and rehab team continue to assess barriers to discharge/monitor patient progress toward functional and medical goals  Care Tool:  Bathing    Body parts bathed by patient: Right arm, Chest, Left arm, Abdomen, Front perineal area, Buttocks, Face   Body parts bathed by helper: Right upper leg, Left upper leg, Right lower leg, Left lower leg     Bathing assist Assist Level: Maximal Assistance - Patient 24 -  49%     Upper Body Dressing/Undressing Upper body dressing   What is the patient wearing?: Pull over shirt    Upper body assist Assist Level: Minimal Assistance - Patient > 75%    Lower Body Dressing/Undressing Lower body dressing      What is the patient wearing?: Pants     Lower body assist Assist for lower body dressing: Maximal Assistance - Patient 25 - 49%     Toileting Toileting Toileting Activity did not occur (Clothing management and hygiene only): N/A (no void or bm)  Toileting assist Assist for toileting: Total Assistance - Patient < 25%     Transfers Chair/bed transfer  Transfers assist  Chair/bed transfer activity did not occur:  Safety/medical concerns (due to dizziness)  Chair/bed transfer assist level: Moderate Assistance - Patient 50 - 74%     Locomotion Ambulation   Ambulation assist      Assist level: Minimal Assistance - Patient > 75% Assistive device: Walker-rolling Max distance: 45'   Walk 10 feet activity   Assist     Assist level: Minimal Assistance - Patient > 75% Assistive device: Walker-rolling   Walk 50 feet activity   Assist Walk 50 feet with 2 turns activity did not occur: Safety/medical concerns         Walk 150 feet activity   Assist Walk 150 feet activity did not occur: Safety/medical concerns         Walk 10 feet on uneven surface  activity   Assist     Assist level: Minimal Assistance - Patient > 75% Assistive device: Other (comment) (2 handrails)   Wheelchair     Assist Is the patient using a wheelchair?: Yes Type of Wheelchair: Manual    Wheelchair assist level: Dependent - Patient 0% Max wheelchair distance: 150'    Wheelchair 50 feet with 2 turns activity    Assist        Assist Level: Dependent - Patient 0%   Wheelchair 150 feet activity     Assist          Blood pressure (!) 149/68, pulse 68, temperature 97.8 F (36.6 C), resp. rate 17, height 5\' 3"  (1.6 m), weight 55.9 kg, SpO2 100%.  Medical Problem List and Plan: 1. Functional deficits secondary to TBI and polytrauma due to a fall with associated Croop contrecoup brain injury, right frontal lobe hemorrhagic contusion and right frontal ischemic nonhemorrhagic infarct.             -patient may shower             -ELOS/Goals: 7-10 days, PT/OT/SLP mod I to supervision             -Patient is beginning CIR therapies today including PT, OT, and SLP  2.  Antithrombotics: -DVT/anticoagulation:  Mechanical: Sequential compression devices, below knee Bilateral lower extremities             -antiplatelet therapy: ASA 3. Pain Management: Tylenol prn.  4.  Mood/Behavior/Sleep: LCSW to follow for evaluation and support.              -antipsychotic agents: N/A             --chronic insomnia. Naps on and off throughout the day. 5. Neuropsych/cognition: This patient is not fully capable of making decisions on her own behalf. 6. Skin/Wound Care: Routine pressure relief measures.  7. Fluids/Electrolytes/Nutrition: Strict I/O. Change to renal diet. Continue 1500 cc FR. -hypokalemia--will defer to renal team for replacement  8. A fib w/RVR/Acute systolic CHF: Fluid overload due to A fib and managed with HD.              --Duo nebs prn added   for SOB? 1500 cc FR/monitor weights daily.              --monitor HR TID--on amiodarone taper. Continue metoprolol Filed Weights   10/30/22 2112 10/31/22 0602  Weight: 55.4 kg 55.9 kg     9. HTN: Monitor BP TID--on metoprolol bid.  Monitor with mobility  9/12 controlled 10. ESRD: HD MWF at the end of the day to help with tolerance of therapy             --hx of intradialysis hypotension (midodrine in the past) 11. Pancreatic insufficiency s/p Whipple: Continue Creon with meals  BID ac             --has dumping syndrome (abdominal pain with meals followed by diarrhea at times) 12. Left pubic rami/Sacral Ala at S4: WBAT LLE per Dr. Thad Ranger. Follow up Xrays in 2 weeks. 13. L-renal mass (likely RCC) urine leak s/p chronic nephrostomy tube: Tube exchanged out on 09/03 by IR per protocol             --not on AC due to bleeding from nephrostomy tube/kidney.  Measure output daily.   14. T2DM diet controlled w/peripheral neuropathy: Monitor BS ac/hs and use SSI for elevated BS.  --Hgb A1C- 6.1. will add CM restrictions.  15. Likely BPPV-- unable to do any basic testing d/t dizziness/sx today 9/12 -vestibular eval ordered--hopefully she can tolerate               --also check orthostatic vitals.       -changed meclizine to scheduled for now, increased dose to 25mg  TID  16. Anemia of CKD: Monitor with serial checks.  H/H stable in 10 range. Add iron panel to HD labs  17. H/o Shingles w/optic nerve involvement: Continue Valtrex.  18. Restless leg syndrome: On Requip 0.5 mg HS with additional 0.25 mg prn during the day. Discussed that this medication could contribute to dizziness but patient feels it is unlikely because she has been taking it for a long time.    LOS: 2 days A FACE TO FACE EVALUATION WAS PERFORMED  Virgilia Quigg P [place] 11/01/2022, 11:13 AM

## 2022-11-01 NOTE — Progress Notes (Signed)
Speech Language Pathology TBI Note  Patient Details  Name: Kim Burns MRN: [id number] Date of Birth: [birth year]  Today's Date: 11/01/2022 SLP Individual Time: 6063-0160 SLP Individual Time Calculation (min): 55 min  Short Term Goals: Week 1: SLP Short Term Goal 1 (Week 1): Patient will utilize memory compensatory strategies to recall daily, functional information with Min verbal cues. SLP Short Term Goal 2 (Week 1): Patient will utilize external aids for recall of date with Mod I. SLP Short Term Goal 3 (Week 1): Patient will demonstrate functional problem solving for basic and familiar tasks with Min verbal cues.  Skilled Therapeutic Interventions: Skilled treatment session focused on cognitive goals. Upon arrival, patient was awake while upright in the bed. SLP facilitated session by providing patient with a calendar in which she was independently able to utilize for recall of the date. SLP also facilitated session by overall Mod I for patient to complete basic medication management tasks that focused on medication safety and organization. Patient completed verbal problem solving/reasoning tasks independently regarding how to track her fluid intake (on a fluid restriction) and blood sugar when at home.  Patient left upright in bed with alarm on and all needs within reach. Continue with current plan of care.     Pain Intermittent pain in left leg during bed mobility, patient repositioned   Agitated Behavior Scale: TBI Observation Details Observation Environment: pt room/therapy Start of observation period - Date: 11/01/22 Start of observation period - Time: 1045 End of observation period - Date: 11/01/22 End of observation period - Time: 1110 Agitated Behavior Scale (DO NOT LEAVE BLANKS) Short attention span, easy distractibility, inability to concentrate: Absent Impulsive, impatient, low tolerance for pain or frustration: Absent Uncooperative, resistant to care, demanding:  Absent Violent and/or threatening violence toward people or property: Absent Explosive and/or unpredictable anger: Absent Rocking, rubbing, moaning, or other self-stimulating behavior: Absent Pulling at tubes, restraints, etc.: Absent Wandering from treatment areas: Absent Restlessness, pacing, excessive movement: Absent Repetitive behaviors, motor, and/or verbal: Absent Rapid, loud, or excessive talking: Absent Sudden changes of mood: Absent Easily initiated or excessive crying and/or laughter: Absent Self-abusiveness, physical and/or verbal: Absent Agitated behavior scale total score: 14  Therapy/Group: Individual Therapy  Santo Zahradnik 11/01/2022, 2:15 PM

## 2022-11-01 NOTE — Progress Notes (Signed)
[facility] Progress Note   Subjective:   Seen in room - ongoing dizziness/vertigo symptoms, but was able to work with PT yesterday. No CP/dyspnea. For HD later today.  Objective Vitals:   10/31/22 1425 10/31/22 1927 11/01/22 0435 11/01/22 0807  BP: (!) 164/69 (!) 155/65 (!) 164/79 (!) 149/68  Pulse: 60 62 70 68  Resp: 14 17 17    Temp: 97.7 F (36.5 C) 97.7 F (36.5 C) 97.8 F (36.6 C)   TempSrc: Axillary     SpO2: 100% 99% 100%   Weight:      Height:       Physical Exam General: Elderly woman, NAD. Room air Heart: RRR; no murmur Lungs: CTAB, no rales Abdomen: soft Extremities: no LE edema; does have dependent edema in L elbow region - propped it up on pillow for her Dialysis Access: LUE AVF + bruit  Additional Objective Labs: Basic Metabolic Panel: Recent Labs  Lab 10/28/22 0906 10/30/22 0825 10/30/22 2303 10/31/22 0019 10/31/22 0804  NA 130*   < > 133* 133* 132*  K 3.5   < > 3.2* 3.3* 3.3*  CL 93*   < > 94* 95* 95*  CO2 23   < > 28 26 26   GLUCOSE 149*   < > 119* 104* 101*  BUN 38*   < > 16 16 21   CREATININE 4.23*   < > 2.53* 2.59* 2.85*  CALCIUM 7.6*   < > 8.1* 8.2* 8.3*  PHOS 6.5*  --  3.2  --   --    < > = values in this interval not displayed.   Liver Function Tests: Recent Labs  Lab 10/28/22 0906 10/30/22 2303 10/31/22 0804  AST  --   --  21  ALT  --   --  32  ALKPHOS  --   --  71  BILITOT  --   --  0.6  PROT  --   --  4.5*  ALBUMIN 2.0* 2.1* 2.0*   CBC: Recent Labs  Lab 10/28/22 0906 10/30/22 0825 10/30/22 2303 10/31/22 0804  WBC 8.8 7.7 7.6 8.6  NEUTROABS  --   --   --  6.8  HGB 10.9* 10.5* 11.7* 11.2*  HCT 33.4* 32.3* 35.1* 34.2*  MCV 94.1 95.6 96.4 94.5  PLT 214 242 248 265   Medications:  anticoagulant sodium citrate      acetaminophen  500 mg Oral TID   amiodarone  200 mg Oral BID   Followed by   Melene Muller ON 11/04/2022] amiodarone  200 mg Oral Daily   aspirin  81 mg Oral Daily   Chlorhexidine Gluconate Cloth   6 each Topical Q0600   insulin aspart  0-5 Units Subcutaneous QHS   insulin aspart  0-6 Units Subcutaneous TID WC   lidocaine  1 patch Transdermal Q24H   lipase/protease/amylase  36,000 Units Oral BID WC   meclizine  12.5 mg Oral TID   metoprolol succinate  25 mg Oral Daily   rOPINIRole  0.5 mg Oral QHS   rOPINIRole  0.5 mg Oral QHS   sevelamer carbonate  2,400 mg Oral TID WC   valACYclovir  500 mg Oral QHS    Dialysis Orders: MWF at [facility] 3.5hr, 350/A1.5, EDW 55kg, 2K/2C bath, L AVF, no heparin - Mircera was on hold at time of admit - Hectoral IV q HD  Assessment/Plan: Head injury/fall from bed -> R frontal hemorrhagic contusion, R frontal ischemic CVA, small B SDHs, L posterior scalp hematoma,  non-displaced L parietal-occipital bone fracture: Now in CIR ESRD: Continue HD on usual MWF schedule -> for HD today, no heparin, 3K bath. HTN/volume: BP high, some edema on exam - UF as tolerated today. If doesn't come  down with HD, would ^ meds. Anemia of ESRD: Hgb > 11, no ESA at this time. Secondary hyperparathyroidism:  CorrCa and Phos ok - continue Renvela. Not getting VDRA here -> resume. Nutrition: Alb low, adding supplements.  Ozzie Hoyle, PA-C 11/01/2022, 8:51 AM  [employer]

## 2022-11-01 NOTE — Progress Notes (Signed)
Patient ID: Kim Burns, female   DOB: Nov 14, 1935, 87 y.o.   MRN: [id number]  SW spoke with pt son Kim Burns to inform on ELOS, and will follow-up after team conference.  Cecile Sheerer, MSW, LCSWA Office: [phone] Cell: [phone] Fax: [phone]

## 2022-11-01 NOTE — Progress Notes (Signed)
Physical Therapy TBI Note  Patient Details  Name: Kim Burns MRN: [id number] Date of Birth: 1935/08/10  Today's Date: 11/01/2022 PT Individual Time: 1045-1110 PT Individual Time Calculation (min): 25 min   Short Term Goals: Week 1:  PT Short Term Goal 1 (Week 1): Pt will perform bed mobility with CGA. PT Short Term Goal 2 (Week 1): Pt will perform sit to stand with CGA. PT Short Term Goal 3 (Week 1): PT will perform bed to chair with CGA consistently. PT Short Term Goal 4 (Week 1): Pt will ambulate x50' with CGA and LRAD.  Skilled Therapeutic Interventions/Progress Updates:    Pt presents up in w/c with head back and eyes closed. Pt unable to maintain eyes open and stating she just can't do anything. Attempted to engage in gaze stabilization exercise to address symptoms but pt unable to tolerate. Pt agreeable to transfer back to bed. Extra time and verbal cues needed for sequencing for transfer with RW with hand over hand for hand placement with mod assist to stand and min assist for stand step with RW to the bed. Cues needed for sequencing to sit down and then transition to bed with mod assist for BLE management. Pt declined any further intervention. Missed the rest of session.   Therapy Documentation Precautions:  Precautions Precautions: Fall Precaution Comments: dizziness Restrictions Weight Bearing Restrictions: Yes LLE Weight Bearing: Weight bearing as tolerated General: PT Amount of Missed Time (min): 50 Minutes PT Missed Treatment Reason: Patient ill (Comment);Patient unwilling to participate  Pain: C/o 10/10 dizziness from vertigo. Attempted to engage in gaze stabilization exercise but pt unable. C/o pain in LLE with any movement - did not rate. States she had medication.  Agitated Behavior Scale: TBI Observation Details Observation Environment: pt room/therapy Start of observation period - Date: 11/01/22 Start of observation period - Time: 1045 End of observation  period - Date: 11/01/22 End of observation period - Time: 1110 Agitated Behavior Scale (DO NOT LEAVE BLANKS) Short attention span, easy distractibility, inability to concentrate: Absent Impulsive, impatient, low tolerance for pain or frustration: Absent Uncooperative, resistant to care, demanding: Absent Violent and/or threatening violence toward people or property: Absent Explosive and/or unpredictable anger: Absent Rocking, rubbing, moaning, or other self-stimulating behavior: Absent Pulling at tubes, restraints, etc.: Absent Wandering from treatment areas: Absent Restlessness, pacing, excessive movement: Absent Repetitive behaviors, motor, and/or verbal: Absent Rapid, loud, or excessive talking: Absent Sudden changes of mood: Absent Easily initiated or excessive crying and/or laughter: Absent Self-abusiveness, physical and/or verbal: Absent Agitated behavior scale total score: 14     Therapy/Group: Individual Therapy  Karolee Stamps Darrol Poke, PT, DPT, CBIS  11/01/2022, 11:12 AM

## 2022-11-02 DIAGNOSIS — H8110 Benign paroxysmal vertigo, unspecified ear: Secondary | ICD-10-CM | POA: Diagnosis not present

## 2022-11-02 DIAGNOSIS — G2581 Restless legs syndrome: Secondary | ICD-10-CM | POA: Diagnosis not present

## 2022-11-02 DIAGNOSIS — N186 End stage renal disease: Secondary | ICD-10-CM | POA: Diagnosis not present

## 2022-11-02 DIAGNOSIS — Z8619 Personal history of other infectious and parasitic diseases: Secondary | ICD-10-CM | POA: Diagnosis not present

## 2022-11-02 LAB — GLUCOSE, CAPILLARY
Glucose-Capillary: 92 mg/dL (ref 70–99)
Glucose-Capillary: 91 mg/dL (ref 70–99)
Glucose-Capillary: 152 mg/dL — ABNORMAL HIGH (ref 70–99)

## 2022-11-02 NOTE — Progress Notes (Signed)
Physical Therapy TBI Note  Patient Details  Name: Kim Burns MRN: [id number] Date of Birth: 1935/04/28  Today's Date: 11/02/2022 PT Individual Time: 7829-5621 PT Individual Time Calculation (min): 57 min   Short Term Goals: Week 1:  PT Short Term Goal 1 (Week 1): Pt will perform bed mobility with CGA. PT Short Term Goal 2 (Week 1): Pt will perform sit to stand with CGA. PT Short Term Goal 3 (Week 1): PT will perform bed to chair with CGA consistently. PT Short Term Goal 4 (Week 1): Pt will ambulate x50' with CGA and LRAD.  Skilled Therapeutic Interventions/Progress Updates:    Pt received supine in bed asleep, but easily awakens and is agreeable to therapy session. Plan for vestibular evaluation today and pt in agreement.  Pt reports prior to fall with current hospitalization, she has never had vertigo or other dizziness symptoms.   Pt reports since the fall and during current hospitalization she has had episodes where the "room is spinning" and states "sometimes I just fell like I'm going to pass out." Pt goes on to state "as long as I hold my head straight, I don't have any of the symptoms, but if I move my head or stand-up I get them."  Also, describes the feeling of "my legs just get weak and I'm gone" when going to sit-up or stand-up.  Performed orthostatic vital sign assessment as follows:   Supine: BP 123/62 (MAP 79), HR 60bpm  Sitting: BP 107/63 (MAP 77), HR 62bpm 10/10 dizziness when sitting up Standing: BP 77/62 (MAP 68 ), HR 66bpm Sitting back in w/c: BP 104/57 (MAP 72 ), HR 60bpm  Supine>sitting R EOB, HOB partially elevated and using bedrail, with skilled min/mod A for L LE management to EOB and trunk upright via HHA. Pt requires seated rest break on EOB for symptom management.   Sit<>stands using RW throughout session with min A and pt relying heavily on B UE support to assist with rising to stand and lowering to sit - she moves very slow, guarded throughout all  transfers. Stand pivots using RW with min A for balance and therapist manually facilitating turning of the AD - requires increased time for transfer and uses B UEs to off-weight L LE during stance phase.  Pt transported to/from gym in w/c to perform vestibular assessment on mat due to pt having an air mattress.  Pt denies any neck pain and demonstrates WFL neck AROM.  Requires +2 assist to safely perform all positional testing due to pt being fearful and guarded with all movements.  R Dix Hallpike + for symptoms and difficulty seeing nystagmus because pt closing eyes so firmly, despite therapist attempts at opening eyelid. Symptoms lasted >30seconds; however, felt starting with Epley maneuver would be most appropriate for this pt at this time due to her fearful of movement and intensity of symptoms. Completed Epley maneuver for R posterior canal BPPV with pt having symptoms in every position that lasted >30 seconds. Pt also with some difficulty describing her symptoms at times.  Planning for follow-up vestibular assessment on Tuesday 9/17.   Transported back to room and donned thigh high TED hose - made MD, nurse, and primary PT aware of pt's drop in BP with standing. Educated pt on drinking as much as possible within her fluid restrictions.  Pt left seated in w/c with needs in reach and seat belt alarm on.  Therapy Documentation Precautions:  Precautions Precautions: Fall, Other (comment) Precaution Comments: L lower abdominal  drain, vertigo, orthostatic hypotension Restrictions Weight Bearing Restrictions: Yes LLE Weight Bearing: Weight bearing as tolerated   Pain:   Reports L hip pain that increases with movement - therapist provides physical assistance for all movements to manage pain.  Agitated Behavior Scale: TBI  Observation Details Observation Environment: CIR Start of observation period - Date: 11/02/22 Start of observation period - Time: 0853 End of observation period -  Date: 11/02/22 End of observation period - Time: 0950 Agitated Behavior Scale (DO NOT LEAVE BLANKS) Short attention span, easy distractibility, inability to concentrate: Present to a slight degree Impulsive, impatient, low tolerance for pain or frustration: Present to a slight degree Uncooperative, resistant to care, demanding: Absent Violent and/or threatening violence toward people or property: Absent Explosive and/or unpredictable anger: Absent Rocking, rubbing, moaning, or other self-stimulating behavior: Absent Pulling at tubes, restraints, etc.: Absent Wandering from treatment areas: Absent Restlessness, pacing, excessive movement: Absent Repetitive behaviors, motor, and/or verbal: Absent Rapid, loud, or excessive talking: Absent Sudden changes of mood: Absent Easily initiated or excessive crying and/or laughter: Absent Self-abusiveness, physical and/or verbal: Absent Agitated behavior scale total score: 16   Therapy/Group: Individual Therapy  Ginny Forth , PT, DPT, NCS, CSRS 11/02/2022, 8:01 AM

## 2022-11-02 NOTE — Progress Notes (Signed)
Occupational Therapy Session Note  Patient Details  Name: Kim Burns MRN: [id number] Date of Birth: February 24, 1935  {CHL IP REHAB OT TIME CALCULATIONS:[id number]}   Short Term Goals: Week 1:  OT Short Term Goal 1 (Week 1): Pt will tolerate sitting EOB for 5 mins in preparation for BADL tsaks OT Short Term Goal 2 (Week 1): Patient will complete toilet transfer with mod A of 1 OT Short Term Goal 3 (Week 1): Patient will complete 1 step of LB dressing task using LRAD  Skilled Therapeutic Interventions/Progress Updates:  Pt received *** for skilled OT session with focus on ***. Pt agreeable to interventions, demonstrating overall *** mood. Pt reported ***/10 pain, stating "***" in reference to ***. OT offering intermediate rest breaks and positioning suggestions throughout session to address pain/fatigue and maximize participation/safety in session.    Pt remained *** with all immediate needs met at end of session. Pt continues to be appropriate for skilled OT intervention to promote further functional independence.   Therapy Documentation Precautions:  Precautions Precautions: Fall, Other (comment) Precaution Comments: L lower abdominal drain, vertigo, orthostatic hypotension Restrictions Weight Bearing Restrictions: Yes LLE Weight Bearing: Weight bearing as tolerated   Therapy/Group: Individual Therapy  Lou Cal, OTR/L, MSOT  11/02/2022, 2:36 PM

## 2022-11-02 NOTE — Progress Notes (Signed)
Occupational Therapy Session Note  Patient Details  Name: Kim Burns MRN: [id number] Date of Birth: 04-Sep-1935  Today's Date: 11/02/2022 OT Individual Time: 7846-9629 OT Individual Time Calculation (min): 41 min    Short Term Goals: Week 1:  OT Short Term Goal 1 (Week 1): Pt will tolerate sitting EOB for 5 mins in preparation for BADL tsaks OT Short Term Goal 2 (Week 1): Patient will complete toilet transfer with mod A of 1 OT Short Term Goal 3 (Week 1): Patient will complete 1 step of LB dressing task using LRAD  Skilled Therapeutic Interventions/Progress Updates:  Pt greeted seated in w/c, pt agreeable to OT intervention.  pt requesting something to drink and reporting mild dizziness. Pt requested to complete oral care at sink. Pt completed oral care with set- up assist. Pt transported to gym with total A. Pt completed various therapeutic activities to facilitate improved dynamic standing balance, standing tolerance, gaze stabilization and functional mobility.   Pt instructed to stand with RW to transport items across midline with an emphasis on head turns while working on gaze stabilization. Pt needed heavy MIN A to stand from w/c with pt preferring to push up with BUEs from w/c. Pt denies dizziness during task.   Pt completed functional mobility task with pt instructed to transport items in between 2 tables ( ~ 5 ft apart) with an emphasis on short distance functional mobility, functional reaching, head turns with an emphasis on gaze stabilization to simulate distance in home setting. Pt completed functional mobility with Rw with CGA, pt ambulates with a short slow gait d/t LLE pain and to decrease dizziness.   Pt transported back to room with total A, ended session with pt seated in w/c alarm belt activated and all needs within reach.   Therapy Documentation Precautions:  Precautions Precautions: Fall Precaution Comments: dizziness Restrictions Weight Bearing Restrictions:  Yes LLE Weight Bearing: Weight bearing as tolerated    Pain: unrated pain reported in LLE, rest breaks, repositioning and distraction provided as needed.     Therapy/Group: Individual Therapy  [name] 11/02/2022, 12:09 PM

## 2022-11-02 NOTE — Progress Notes (Signed)
[facility] Progress Note   Subjective:  Seen in room - just waking up and getting her breakfast. Thinks she feels ok today. HD went fine yesterday - 2.2L off.  Objective Vitals:   11/01/22 1731 11/01/22 1841 11/01/22 1923 11/02/22 0303  BP: (!) 144/65 135/62 128/63 (!) 145/71  Pulse: (!) 55 (!) 58 (!) 59 (!) 57  Resp: 15 17 18 18   Temp:   97.6 F (36.4 C) 97.7 F (36.5 C)  TempSrc:   Oral   SpO2: 98% 100% 100% 100%  Weight:      Height:       Physical Exam General: Elderly woman, NAD. Room air Heart: RRR; no murmur Lungs: CTAB, no rales Abdomen: soft Extremities: Trace BLE edema; 2+ L elbow region edema Dialysis Access: LUE AVF + bruit  Additional Objective Labs: Basic Metabolic Panel: Recent Labs  Lab 10/28/22 0906 10/30/22 0825 10/30/22 2303 10/31/22 0019 10/31/22 0804 11/01/22 1330  NA 130*   < > 133* 133* 132* 133*  K 3.5   < > 3.2* 3.3* 3.3* 3.5  CL 93*   < > 94* 95* 95* 94*  CO2 23   < > 28 26 26 26   GLUCOSE 149*   < > 119* 104* 101* 81  BUN 38*   < > 16 16 21  38*  CREATININE 4.23*   < > 2.53* 2.59* 2.85* 3.58*  CALCIUM 7.6*   < > 8.1* 8.2* 8.3* 8.2*  PHOS 6.5*  --  3.2  --   --  3.8   < > = values in this interval not displayed.   Liver Function Tests: Recent Labs  Lab 10/30/22 2303 10/31/22 0804 11/01/22 1330  AST  --  21  --   ALT  --  32  --   ALKPHOS  --  71  --   BILITOT  --  0.6  --   PROT  --  4.5*  --   ALBUMIN 2.1* 2.0* 2.0*   CBC: Recent Labs  Lab 10/28/22 0906 10/30/22 0825 10/30/22 2303 10/31/22 0804 11/01/22 1330  WBC 8.8 7.7 7.6 8.6 7.7  NEUTROABS  --   --   --  6.8  --   HGB 10.9* 10.5* 11.7* 11.2* 10.8*  HCT 33.4* 32.3* 35.1* 34.2* 33.4*  MCV 94.1 95.6 96.4 94.5 97.1  PLT 214 242 248 265 241  CBG: Recent Labs  Lab 11/01/22 1129 11/01/22 1837 11/01/22 1902 11/01/22 2122 11/02/22 0703  GLUCAP 93 69* 120* 142* 92   Medications:   (feeding supplement) PROSource Plus  30 mL Oral BID BM    acetaminophen  500 mg Oral TID   amiodarone  200 mg Oral BID   Followed by   Melene Muller ON 11/04/2022] amiodarone  200 mg Oral Daily   aspirin  81 mg Oral Daily   Chlorhexidine Gluconate Cloth  6 each Topical Q0600   doxercalciferol  6 mcg Intravenous Q M,W,F-HD   insulin aspart  0-5 Units Subcutaneous QHS   insulin aspart  0-6 Units Subcutaneous TID WC   lidocaine  1 patch Transdermal Q24H   lipase/protease/amylase  36,000 Units Oral BID WC   meclizine  25 mg Oral TID   metoprolol succinate  25 mg Oral Daily   rOPINIRole  0.5 mg Oral QHS   rOPINIRole  0.5 mg Oral QHS   sevelamer carbonate  2,400 mg Oral TID WC   valACYclovir  500 mg Oral QHS    Dialysis Orders: MWF at [facility]  3.5hr, 350/A1.5, EDW 55kg, 2K/2C bath, L AVF, no heparin - Mircera was on hold at time of admit - Hectoral IV q HD   Assessment/Plan: Head injury/fall from bed -> R frontal hemorrhagic contusion, R frontal ischemic CVA, small B SDHs, L posterior scalp hematoma, non-displaced L parietal-occipital bone fracture: Now in CIR ESRD: Continue HD on usual MWF schedule -> next HD 9/16. HTN/volume: BP better post-HD, still with edema on exam - will plan for ^ UFG with next HD. Anemia of ESRD: Hgb 10.8, no ESA at this time. Secondary hyperparathyroidism:  CorrCa/Phos to goal - continue Renvela + VDRA. Nutrition: Alb low, continue supplements.  Ozzie Hoyle, PA-C 11/02/2022, 9:45 AM  [employer]

## 2022-11-02 NOTE — Progress Notes (Signed)
PROGRESS NOTE   Subjective/Complaints: Patient voices no new complaints this morning, states dizziness is better today Therapy noted patient to be orthostatic today and teds were applied  ROS: Patient denies fever, rash, sore throat, blurred vision,   nausea, vomiting, diarrhea, cough, shortness of breath or chest pain, joint or back/neck pain, headache, or mood change. +dizziness   Objective:   No results found. Recent Labs    10/31/22 0804 11/01/22 1330  WBC 8.6 7.7  HGB 11.2* 10.8*  HCT 34.2* 33.4*  PLT 265 241   Recent Labs    10/31/22 0804 11/01/22 1330  NA 132* 133*  K 3.3* 3.5  CL 95* 94*  CO2 26 26  GLUCOSE 101* 81  BUN 21 38*  CREATININE 2.85* 3.58*  CALCIUM 8.3* 8.2*    Intake/Output Summary (Last 24 hours) at 11/02/2022 1428 Last data filed at 11/02/2022 0950 Gross per 24 hour  Intake 50 ml  Output 2200 ml  Net -2150 ml     Pressure Injury 10/21/22 Buttocks Right Deep Tissue Pressure Injury - Purple or maroon localized area of discolored intact skin or blood-filled blister due to damage of underlying soft tissue from pressure and/or shear. 2 areas of deep tissue pressure in (Active)  10/21/22 1145  Location: Buttocks  Location Orientation: Right  Staging: Deep Tissue Pressure Injury - Purple or maroon localized area of discolored intact skin or blood-filled blister due to damage of underlying soft tissue from pressure and/or shear.  Wound Description (Comments): 2 areas of deep tissue pressure injuries  Present on Admission: Yes    Physical Exam: Vital Signs Blood pressure (!) 145/71, pulse (!) 57, temperature 97.7 F (36.5 C), resp. rate 18, height 5\' 3"  (1.6 m), weight 55.9 kg, SpO2 100%.  Gen: no distress, normal appearing HEENT: oral mucosa pink and moist, NCAT Cardio: Bradycardia Chest: normal effort, normal rate of breathing Abd: soft, non-distended Extremities: No clubbing,  cyanosis, trace LLE edema. Pulses are 2+ Psych: Pt's affect is appropriate. Pt is cooperative Skin: bruising on UE's, LUE AVF Neuro:  Alert and oriented x 3. Normal insight and awareness. Functional Memory. Normal language and speech. Cranial nerve exam unremarkable. MMT: RUE 4/5, LUE 4/5. RLE 3/5 prox to 4/5 distally. LLE 1/5 hip to 4/5 ADF/PF. Sensory exam normal for light touch and pain in all 4 limbs. No limb ataxia or cerebellar signs. No abnormal tone appreciated.  .   Musculoskeletal: Left hip tender with palpation and attempts at PROM. Left chest wall sore too    Assessment/Plan: 1. Functional deficits which require 3+ hours per day of interdisciplinary therapy in a comprehensive inpatient rehab setting. Physiatrist is providing close team supervision and 24 hour management of active medical problems listed below. Physiatrist and rehab team continue to assess barriers to discharge/monitor patient progress toward functional and medical goals  Care Tool:  Bathing    Body parts bathed by patient: Right arm, Chest, Left arm, Abdomen, Front perineal area, Buttocks, Face   Body parts bathed by helper: Right upper leg, Left upper leg, Right lower leg, Left lower leg     Bathing assist Assist Level: Maximal Assistance - Patient 24 - 49%  Upper Body Dressing/Undressing Upper body dressing   What is the patient wearing?: Pull over shirt    Upper body assist Assist Level: Minimal Assistance - Patient > 75%    Lower Body Dressing/Undressing Lower body dressing      What is the patient wearing?: Pants     Lower body assist Assist for lower body dressing: Maximal Assistance - Patient 25 - 49%     Toileting Toileting Toileting Activity did not occur (Clothing management and hygiene only): N/A (no void or bm)  Toileting assist Assist for toileting: Total Assistance - Patient < 25%     Transfers Chair/bed transfer  Transfers assist  Chair/bed transfer activity did not  occur: Safety/medical concerns (due to dizziness)  Chair/bed transfer assist level: Moderate Assistance - Patient 50 - 74%     Locomotion Ambulation   Ambulation assist      Assist level: Minimal Assistance - Patient > 75% Assistive device: Walker-rolling Max distance: 45'   Walk 10 feet activity   Assist     Assist level: Minimal Assistance - Patient > 75% Assistive device: Walker-rolling   Walk 50 feet activity   Assist Walk 50 feet with 2 turns activity did not occur: Safety/medical concerns         Walk 150 feet activity   Assist Walk 150 feet activity did not occur: Safety/medical concerns         Walk 10 feet on uneven surface  activity   Assist     Assist level: Minimal Assistance - Patient > 75% Assistive device: Other (comment) (2 handrails)   Wheelchair     Assist Is the patient using a wheelchair?: Yes Type of Wheelchair: Manual    Wheelchair assist level: Dependent - Patient 0% Max wheelchair distance: 150'    Wheelchair 50 feet with 2 turns activity    Assist        Assist Level: Dependent - Patient 0%   Wheelchair 150 feet activity     Assist          Blood pressure (!) 145/71, pulse (!) 57, temperature 97.7 F (36.5 C), resp. rate 18, height 5\' 3"  (1.6 m), weight 55.9 kg, SpO2 100%.  Medical Problem List and Plan: 1. Functional deficits secondary to TBI and polytrauma due to a fall with associated Croop contrecoup brain injury, right frontal lobe hemorrhagic contusion and right frontal ischemic nonhemorrhagic infarct.             -patient may shower             -ELOS/Goals: 7-10 days, PT/OT/SLP mod I to supervision             -Patient is beginning CIR therapies today including PT, OT, and SLP  2.  Antithrombotics: -DVT/anticoagulation:  Mechanical: Sequential compression devices, below knee Bilateral lower extremities             -antiplatelet therapy: ASA 3. Pain Management: Tylenol prn.  4.  Mood/Behavior/Sleep: LCSW to follow for evaluation and support.              -antipsychotic agents: N/A             --chronic insomnia. Naps on and off throughout the day. 5. Neuropsych/cognition: This patient is not fully capable of making decisions on her own behalf. 6. Skin/Wound Care: Routine pressure relief measures.  7. Fluids/Electrolytes/Nutrition: Strict I/O. Change to renal diet. Continue 1500 cc FR. -hypokalemia--will defer to renal team for replacement  8. A fib w/RVR/Acute  systolic CHF: Fluid overload due to A fib and managed with HD.              --Duo nebs prn added   for SOB? 1500 cc FR/monitor weights daily.              --monitor HR TID--on amiodarone taper. Continue metoprolol Filed Weights   10/30/22 2112 10/31/22 0602  Weight: 55.4 kg 55.9 kg     9. HTN: Monitor BP TID--on metoprolol bid.  Monitor with mobility  9/12 controlled 10. ESRD: HD MWF at the end of the day to help with tolerance of therapy             --hx of intradialysis hypotension (midodrine in the past) 11. Pancreatic insufficiency s/p Whipple: Continue Creon with meals  BID ac             --has dumping syndrome (abdominal pain with meals followed by diarrhea at times) 12. Left pubic rami/Sacral Ala at S4: WBAT LLE per Dr. Thad Ranger. Follow up Xrays in 2 weeks. 13. L-renal mass (likely RCC) urine leak s/p chronic nephrostomy tube: Tube exchanged out on 09/03 by IR per protocol             --not on AC due to bleeding from nephrostomy tube/kidney.  Measure output daily.   14. T2DM diet controlled w/peripheral neuropathy: Monitor BS ac/hs and use SSI for elevated BS.  --Hgb A1C- 6.1. will add CM restrictions.   15. Likely BPPV-- unable to do any basic testing d/t dizziness/sx today 9/12 -vestibular eval ordered--hopefully she can tolerate               --also check orthostatic vitals.       -changed meclizine to scheduled for now, increased dose to 25mg  TID  -discussed with therapy plan for vestibular  treatment on Tuesday  16. Anemia of CKD: Monitor with serial checks. H/H stable in 10 range. Add iron panel to HD labs  17. H/o Shingles w/optic nerve involvement: Continue Valtrex.  18. Restless leg syndrome: On Requip 0.5 mg HS with additional 0.25 mg prn during the day. Discussed that this medication could contribute to dizziness but patient feels it is unlikely because she has been taking it for a long time.   19. Bradycardia: continue to monitor BP TID  20. Orthostasis: add teds with therapy only  21. Resting hypertension: continue amiodarone and metoprolol  LOS: 3 days A FACE TO FACE EVALUATION WAS PERFORMED  Kim Burns P Kim Burns 11/02/2022, 2:28 PM

## 2022-11-02 NOTE — Progress Notes (Signed)
Physical Therapy Session Note  Patient Details  Name: Kim Burns MRN: [id number] Date of Birth: 1935/12/11  Today's Date: 11/02/2022 PT Individual Time: 1315-1345 and 1450-1530 PT Individual Time Calculation (min): 30 min and 40 min and Today's Date: 11/02/2022 PT Missed Time: 15 Minutes Missed Time Reason: Other (Comment) (Eating)  Short Term Goals: Week 1:  PT Short Term Goal 1 (Week 1): Pt will perform bed mobility with CGA. PT Short Term Goal 2 (Week 1): Pt will perform sit to stand with CGA. PT Short Term Goal 3 (Week 1): PT will perform bed to chair with CGA consistently. PT Short Term Goal 4 (Week 1): Pt will ambulate x50' with CGA and LRAD.  Skilled Therapeutic Interventions/Progress Updates:     1st Session;Pt requests to finish lunch prior to therapy. Pt misses 15 minutes of skilled PT. Upon return pt is agreeable to therapy. Reports pain in Lt hip. Number not provided. PT provides gentle mobility and rest breaks to manage pain. WC transport to gym for time management. Pt's BP assessed with pt noted to be 138/57 in sitting and 124/60 in standing. Pt has TED hose in place. Pt stands with minA/modA and cues for hand placement and initiation. Pt ambulates x50' with RW and CGA, with cues for upright gaze to improve posture and balance, and decreasing WB through RW for energy conservation. WC transport back to room. Stand step to bed with minA and RW, with cues for positioning and hand placement. ModA for sit to supine with PT management of BLEs. Pt left semi reclined with all needs within reach.   2nd Session: Pt received semi reclined in bed. Reports pain in LLE. PT provides gentle mobility and rest breaks to manage pain. Pt performs supine to sit slowly with minA and cues for body mechanics and sequencing. Extended time spent at EOB to acclimate to upright positioning. Stand step from bed>WC>nustep with minA and cues for safe AD management and positioning. Pt completes Nustep for  endurance training and strengthening. Pt completes x10:00 at workload of 3 with average steps per minute ~35. PT provides cues for hand and foot placement and completing full available ROM.  Pt takes extended seated rest break, then completes TUG test with modification of use of RW and PT providing minA for sit to stand. Pt ambulates TUG with time of 1:58. WC transport back to room. Left supine in bed with all needs within reach.   Therapy Documentation Precautions:  Precautions Precautions: Fall, Other (comment) Precaution Comments: L lower abdominal drain, vertigo, orthostatic hypotension Restrictions Weight Bearing Restrictions: Yes LLE Weight Bearing: Weight bearing as tolerated    Therapy/Group: Individual Therapy  Beau Fanny, PT, DPT 11/02/2022, 4:26 PM

## 2022-11-02 NOTE — IPOC Note (Signed)
Overall Plan of Care [facility]) Patient Details Name: Kim Burns MRN: [id number] DOB: 12-Jun-1935  Admitting Diagnosis: TBI (traumatic brain injury) [facility])  Hospital Problems: Principal Problem:   TBI (traumatic brain injury) (HCC)     Functional Problem List: Nursing Bladder, Bowel, Safety, Medication Management, Pain, Endurance, Skin Integrity  PT Balance, Endurance, Motor, Pain, Safety  OT Balance, Endurance, Cognition, Vision, Motor (dizziness)  SLP Cognition  TR         Basic ADL's: OT Grooming, Bathing, Dressing, Toileting     Advanced  ADL's: OT       Transfers: PT Bed Mobility, Bed to Chair, Car, Oncologist: PT Ambulation, Stairs     Additional Impairments: OT None  SLP Social Cognition   Memory, Problem Solving  TR      Anticipated Outcomes Item Anticipated Outcome  Self Feeding mod I  Swallowing      Basic self-care  Supervision  Toileting  Supervision   Bathroom Transfers Supervision  Bowel/Bladder  oliguria/HD manage w toileting, manage bowel w mod I  Transfers  Supervision  Locomotion  Supervision  Communication     Cognition  Min A  Pain  < 4 with prns  Safety/Judgment  manage w cues   Therapy Plan: PT Frequency: 5 out of 7 days PT Duration Estimated Length of Stay: 10-14 Days OT Intensity: Minimum of 1-2 x/day, 45 to 90 minutes OT Frequency: 5 out of 7 days OT Duration/Estimated Length of Stay: 2.5-3 weeks SLP Intensity: Minumum of 1-2 x/day, 30 to 90 minutes SLP Frequency: 3 to 5 out of 7 days SLP Duration/Estimated Length of Stay: 10-14 days   Team Interventions: Nursing Interventions Patient/Family Education, Bowel Management, Pain Management, Skin Care/Wound Management, Medication Management, Disease Management/Prevention, Bladder Management, Discharge Planning  PT interventions Ambulation/gait training, Community reintegration, DME/adaptive equipment instruction, Neuromuscular re-education,  Stair training, UE/LE Strength taining/ROM, Psychosocial support, Warden/ranger, Discharge planning, Functional electrical stimulation, Pain management, Skin care/wound management, UE/LE Coordination activities, Therapeutic Activities, Cognitive remediation/compensation, Functional mobility training, Patient/family education, Therapeutic Exercise  OT Interventions Balance/vestibular training, Cognitive remediation/compensation, Community reintegration, Discharge planning, Disease mangement/prevention, DME/adaptive equipment instruction, Functional electrical stimulation, Functional mobility training, Neuromuscular re-education, Pain management, Patient/family education, Psychosocial support, Self Care/advanced ADL retraining, Skin care/wound managment, Splinting/orthotics, Therapeutic Activities, Therapeutic Exercise, UE/LE Strength taining/ROM, UE/LE Coordination activities, Visual/perceptual remediation/compensation, Wheelchair propulsion/positioning  SLP Interventions Cognitive remediation/compensation, Financial trader, Dysphagia/aspiration precaution training, Environmental controls, Functional tasks, Internal/external aids, Patient/family education, Therapeutic Activities  TR Interventions    SW/CM Interventions Discharge Planning, Psychosocial Support, Patient/Family Education   Barriers to Discharge MD  Medical stability  Nursing Decreased caregiver support, Home environment access/layout, Wound Care 2 level main B+B 13 ste/rails; son in and out, SIL assists as well  PT Home environment access/layout    OT      SLP Decreased caregiver support    SW Lack of/limited family support, Decreased caregiver support, Community education officer for SNF coverage     Team Discharge Planning: Destination: PT-Home ,OT- Home , SLP-Home Projected Follow-up: PT-24 hour supervision/assistance, Outpatient PT, OT-  Home health OT, SLP-Home Health SLP, 24 hour supervision/assistance Projected Equipment Needs:  PT-To be determined, OT- To be determined, SLP-None recommended by SLP Equipment Details: PT- , OT-Has a RW and "walking stick" Patient/family involved in discharge planning: PT- Patient,  OT-Patient, SLP-Patient  MD ELOS: 7-10 days Medical Rehab Prognosis:  Excellent Assessment: The patient has been admitted for CIR therapies with the diagnosis of TBI. The team  will be addressing functional mobility, strength, stamina, balance, safety, adaptive techniques and equipment, self-care, bowel and bladder mgt, patient and caregiver education. Goals have been set at supervision. Anticipated discharge destination is home.        See Team Conference Notes for weekly updates to the plan of care

## 2022-11-03 DIAGNOSIS — N186 End stage renal disease: Secondary | ICD-10-CM | POA: Diagnosis not present

## 2022-11-03 DIAGNOSIS — G2581 Restless legs syndrome: Secondary | ICD-10-CM | POA: Diagnosis not present

## 2022-11-03 DIAGNOSIS — Z8619 Personal history of other infectious and parasitic diseases: Secondary | ICD-10-CM | POA: Diagnosis not present

## 2022-11-03 DIAGNOSIS — H8110 Benign paroxysmal vertigo, unspecified ear: Secondary | ICD-10-CM | POA: Diagnosis not present

## 2022-11-03 LAB — GLUCOSE, CAPILLARY
Glucose-Capillary: 103 mg/dL — ABNORMAL HIGH (ref 70–99)
Glucose-Capillary: 101 mg/dL — ABNORMAL HIGH (ref 70–99)
Glucose-Capillary: 132 mg/dL — ABNORMAL HIGH (ref 70–99)
Glucose-Capillary: 138 mg/dL — ABNORMAL HIGH (ref 70–99)
Glucose-Capillary: 109 mg/dL — ABNORMAL HIGH (ref 70–99)

## 2022-11-03 MED ORDER — CALCIUM CARBONATE ANTACID 500 MG PO CHEW
1.0000 | CHEWABLE_TABLET | Freq: Four times a day (QID) | ORAL | Status: DC | PRN
  Administered 2022-11-03: 200 mg via ORAL
  Filled 2022-11-03: qty 1

## 2022-11-03 MED ORDER — BISMUTH SUBSALICYLATE 262 MG/15ML PO SUSP: 30.0000 mL | ORAL | Status: DC | PRN

## 2022-11-03 NOTE — Progress Notes (Signed)
Physical Therapy Session Note  Patient Details  Name: Kim Burns MRN: [id number] Date of Birth: Jan 11, 1936  Today's Date: 11/03/2022 PT Individual Time: 5956-3875 PT Individual Time Calculation (min): 70 min   Short Term Goals: Week 1:  PT Short Term Goal 1 (Week 1): Pt will perform bed mobility with CGA. PT Short Term Goal 2 (Week 1): Pt will perform sit to stand with CGA. PT Short Term Goal 3 (Week 1): PT will perform bed to chair with CGA consistently. PT Short Term Goal 4 (Week 1): Pt will ambulate x50' with CGA and LRAD.  Skilled Therapeutic Interventions/Progress Updates:      Therapy Documentation Precautions:  Precautions Precautions: Fall, Other (comment) Precaution Comments: L lower abdominal drain, vertigo, orthostatic hypotension Restrictions Weight Bearing Restrictions: Yes LLE Weight Bearing: Weight bearing as tolerated   Pt received seated in w/c at bedside sleeping and easily arouses to therapist voice. Pt reports intermittent vertigo throughout session and 8/10 left hip pain. Nurse notified and administered pain medication in session.   Pt dependently transported by w/c to dayroom and requires min/mod A with sit to stand transfers with cues for forward flexion. Pt ambulated 50 ft with RW CGA. Pt transitioned to Nu Step 2 x 5 min at level to address left lower extremity tightness and ROM deficits.   Pt transitioned to blocked practice of sit to stand transfers with multimodal cues and external target to facilitate increased anterior weight shift with transfer. Pt CGA/min A with transfers x 3, deferred further activity as pt reported increase in "room spinning". Pt instructed to focus gaze on stationary target and BP assessed and recorded as 161/69.   Pt dependently transported by w/c to room and left seated at bedside with all needs in reach and chair alarm on.    Therapy/Group: Individual Therapy  Truitt Leep Truitt Leep PT, DPT  11/03/2022, 7:59  AM

## 2022-11-03 NOTE — Progress Notes (Signed)
PROGRESS NOTE   Subjective/Complaints: Sleepy this morning Patient's chart reviewed- No issues reported overnight Vitals signs stable    ROS: Patient Denies fever, rash, sore throat, blurred vision,   nausea, vomiting, diarrhea, cough, shortness of breath or chest pain, joint or back/neck pain, headache, or mood change. +dizziness   Objective:   No results found. Recent Labs    11/01/22 1330  WBC 7.7  HGB 10.8*  HCT 33.4*  PLT 241   Recent Labs    11/01/22 1330  NA 133*  K 3.5  CL 94*  CO2 26  GLUCOSE 81  BUN 38*  CREATININE 3.58*  CALCIUM 8.2*    Intake/Output Summary (Last 24 hours) at 11/03/2022 0902 Last data filed at 11/03/2022 0815 Gross per 24 hour  Intake 523 ml  Output --  Net 523 ml     Pressure Injury 10/21/22 Buttocks Right Deep Tissue Pressure Injury - Purple or maroon localized area of discolored intact skin or blood-filled blister due to damage of underlying soft tissue from pressure and/or shear. 2 areas of deep tissue pressure in (Active)  10/21/22 1145  Location: Buttocks  Location Orientation: Right  Staging: Deep Tissue Pressure Injury - Purple or maroon localized area of discolored intact skin or blood-filled blister due to damage of underlying soft tissue from pressure and/or shear.  Wound Description (Comments): 2 areas of deep tissue pressure injuries  Present on Admission: Yes    Physical Exam: Vital Signs Blood pressure 122/66, pulse 64, temperature 98.9 F (37.2 C), temperature source Oral, resp. rate 16, height 5\' 3"  (1.6 m), weight 55.9 kg, SpO2 98%.  Gen: no distress, normal appearing HEENT: oral mucosa pink and moist, NCAT Cardio: Reg rate Chest: normal effort, normal rate of breathing Abd: soft, non-distended Extremities: No clubbing, cyanosis, trace LLE edema. Pulses are 2+ Psych: Pt's affect is appropriate. Pt is cooperative Skin: bruising on UE's, LUE AVF Neuro:   Alert and oriented x 3. Normal insight and awareness. Functional Memory. Normal language and speech. Cranial nerve exam unremarkable. MMT: RUE 4/5, LUE 4/5. RLE 3/5 prox to 4/5 distally. LLE 1/5 hip to 4/5 ADF/PF. Sensory exam normal for light touch and pain in all 4 limbs. No limb ataxia or cerebellar signs. No abnormal tone appreciated.  .   Musculoskeletal: Left hip tender with palpation and attempts at PROM. Left chest wall sore too    Assessment/Plan: 1. Functional deficits which require 3+ hours per day of interdisciplinary therapy in a comprehensive inpatient rehab setting. Physiatrist is providing close team supervision and 24 hour management of active medical problems listed below. Physiatrist and rehab team continue to assess barriers to discharge/monitor patient progress toward functional and medical goals  Care Tool:  Bathing    Body parts bathed by patient: Right arm, Chest, Left arm, Abdomen, Front perineal area, Buttocks, Face   Body parts bathed by helper: Right upper leg, Left upper leg, Right lower leg, Left lower leg     Bathing assist Assist Level: Maximal Assistance - Patient 24 - 49%     Upper Body Dressing/Undressing Upper body dressing   What is the patient wearing?: Pull over shirt    Upper body  assist Assist Level: Minimal Assistance - Patient > 75%    Lower Body Dressing/Undressing Lower body dressing      What is the patient wearing?: Pants     Lower body assist Assist for lower body dressing: Maximal Assistance - Patient 25 - 49%     Toileting Toileting Toileting Activity did not occur (Clothing management and hygiene only): N/A (no void or bm)  Toileting assist Assist for toileting: Total Assistance - Patient < 25%     Transfers Chair/bed transfer  Transfers assist  Chair/bed transfer activity did not occur: Safety/medical concerns (due to dizziness)  Chair/bed transfer assist level: Moderate Assistance - Patient 50 - 74%      Locomotion Ambulation   Ambulation assist      Assist level: Minimal Assistance - Patient > 75% Assistive device: Walker-rolling Max distance: 45'   Walk 10 feet activity   Assist     Assist level: Minimal Assistance - Patient > 75% Assistive device: Walker-rolling   Walk 50 feet activity   Assist Walk 50 feet with 2 turns activity did not occur: Safety/medical concerns         Walk 150 feet activity   Assist Walk 150 feet activity did not occur: Safety/medical concerns         Walk 10 feet on uneven surface  activity   Assist     Assist level: Minimal Assistance - Patient > 75% Assistive device: Other (comment) (2 handrails)   Wheelchair     Assist Is the patient using a wheelchair?: Yes Type of Wheelchair: Manual    Wheelchair assist level: Dependent - Patient 0% Max wheelchair distance: 150'    Wheelchair 50 feet with 2 turns activity    Assist        Assist Level: Dependent - Patient 0%   Wheelchair 150 feet activity     Assist          Blood pressure 122/66, pulse 64, temperature 98.9 F (37.2 C), temperature source Oral, resp. rate 16, height 5\' 3"  (1.6 m), weight 55.9 kg, SpO2 98%.  Medical Problem List and Plan: 1. Functional deficits secondary to TBI and polytrauma due to a fall with associated Croop contrecoup brain injury, right frontal lobe hemorrhagic contusion and right frontal ischemic nonhemorrhagic infarct.             -patient may shower             -ELOS/Goals: 7-10 days, PT/OT/SLP mod I to supervision             -Patient is beginning CIR therapies today including PT, OT, and SLP  2.  Antithrombotics: -DVT/anticoagulation:  Mechanical: Sequential compression devices, below knee Bilateral lower extremities             -antiplatelet therapy: ASA 3. Pain Management: Tylenol prn.  4. Mood/Behavior/Sleep: LCSW to follow for evaluation and support.              -antipsychotic agents: N/A              --chronic insomnia. Naps on and off throughout the day. 5. Neuropsych/cognition: This patient is not fully capable of making decisions on her own behalf. 6. Skin/Wound Care: Routine pressure relief measures.  7. Fluids/Electrolytes/Nutrition: Strict I/O. Change to renal diet. Continue 1500 cc FR. -hypokalemia--will defer to renal team for replacement  8. A fib w/RVR/Acute systolic CHF: Fluid overload due to A fib and managed with HD.              --  Duo nebs prn added   for SOB? 1500 cc FR/monitor weights daily.              --monitor HR TID--on amiodarone taper. Continue metoprolol Filed Weights   10/30/22 2112 10/31/22 0602  Weight: 55.4 kg 55.9 kg     9. HTN: Monitor BP TID--on metoprolol bid.  Monitor with mobility  9/12 controlled 10. ESRD: HD MWF at the end of the day to help with tolerance of therapy             --hx of intradialysis hypotension (midodrine in the past) 11. Pancreatic insufficiency s/p Whipple: Continue Creon with meals  BID ac             --has dumping syndrome (abdominal pain with meals followed by diarrhea at times) 12. Left pubic rami/Sacral Ala at S4: WBAT LLE per Dr. Thad Ranger. Follow up Xrays in 2 weeks. 13. L-renal mass (likely RCC) urine leak s/p chronic nephrostomy tube: Tube exchanged out on 09/03 by IR per protocol             --not on AC due to bleeding from nephrostomy tube/kidney.  Measure output daily.   14. T2DM diet controlled w/peripheral neuropathy: Monitor BS ac/hs and use SSI for elevated BS.  --Hgb A1C- 6.1. will add CM restrictions.   15. Likely BPPV-- unable to do any basic testing d/t dizziness/sx today 9/12 -vestibular eval ordered--hopefully she can tolerate               --also check orthostatic vitals.       -changed meclizine to scheduled for now, increased dose to 25mg  TID  -discussed with therapy plan for vestibular treatment on Tuesday  16. Anemia of CKD: Monitor with serial checks. H/H stable in 10 range. Add iron panel to HD  labs  17. H/o Shingles w/optic nerve involvement: Continue Valtrex.  18. Restless leg syndrome: Continue Requip 0.5 mg HS with additional 0.25 mg prn during the day. Discussed that this medication could contribute to dizziness but patient feels it is unlikely because she has been taking it for a long time.   19. Bradycardia: resolved, continue to monitor HR TID  20. Orthostasis: continue teds with therapy only  21. Resting hypertension: continue amiodarone and metoprolol  LOS: 4 days A FACE TO FACE EVALUATION WAS PERFORMED  [name] Tangen P Cleofas Hudgins 11/03/2022, 9:02 AM

## 2022-11-03 NOTE — Progress Notes (Signed)
Occupational Therapy TBI Note  Patient Details  Name: Kim Burns MRN: [id number] Date of Birth: Apr 11, 1935  Today's Date: 11/03/2022 OT Individual Time: 1914-7829 OT Individual Time Calculation (min): 74 min    Short Term Goals: Week 1:  OT Short Term Goal 1 (Week 1): Pt will tolerate sitting EOB for 5 mins in preparation for BADL tsaks OT Short Term Goal 2 (Week 1): Patient will complete toilet transfer with mod A of 1 OT Short Term Goal 3 (Week 1): Patient will complete 1 step of LB dressing task using LRAD  Skilled Therapeutic Interventions/Progress Updates:    Pt resting in bed upon arrival. Pt reports she was disappointed that she had a full schedule today because she was informed on previous day that she would have today "off." Pt agreeable to getting OOB for grooming at sink and table tasks. Pt cooperative thrughout session. Pt oriented x3. Supine>sit EOB with min A. Sit>stand with min A. Stand pivot tranfser using RW with CGA. Grooming at sink with supervision. Pt completed table task with large Lego blocks. Pt completed two patterns while engaging in general conversation with OTA. Pt able to complete task while engaged in conversation. Pt pleasant throughout session. Pt remained seated in w/c with all needs within reach. Belt alarm activated.   Therapy Documentation Precautions:  Precautions Precautions: Fall, Other (comment) Precaution Comments: L lower abdominal drain, vertigo, orthostatic hypotension Restrictions Weight Bearing Restrictions: Yes LLE Weight Bearing: Weight bearing as tolerated  Pain:  Pt c/o LLE pain (unrated); repositioned  Agitated Behavior Scale: TBI Observation Details Observation Environment: CIR Start of observation period - Date: 11/03/22 Start of observation period - Time: 0815 End of observation period - Date: 11/03/22 End of observation period - Time: 0930 Agitated Behavior Scale (DO NOT LEAVE BLANKS) Short attention span, easy  distractibility, inability to concentrate: Absent Impulsive, impatient, low tolerance for pain or frustration: Absent Uncooperative, resistant to care, demanding: Absent Violent and/or threatening violence toward people or property: Absent Explosive and/or unpredictable anger: Absent Rocking, rubbing, moaning, or other self-stimulating behavior: Absent Pulling at tubes, restraints, etc.: Absent Wandering from treatment areas: Absent Restlessness, pacing, excessive movement: Absent Repetitive behaviors, motor, and/or verbal: Absent Rapid, loud, or excessive talking: Absent Sudden changes of mood: Absent Easily initiated or excessive crying and/or laughter: Absent Self-abusiveness, physical and/or verbal: Absent Agitated behavior scale total score: 14    Therapy/Group: Individual Therapy  Rich Brave 11/03/2022, 9:36 AM

## 2022-11-03 NOTE — Progress Notes (Signed)
[facility] Progress Note   Subjective:  Seen in room. Feels ok this morning. No CP/dyspnea. Still with lots of edema in her L elbow, non-tender.  Objective Vitals:   11/02/22 1943 11/03/22 0236 11/03/22 0241 11/03/22 0243  BP: 132/63 (!) 146/72 123/66 122/66  Pulse: (!) 58 61 66 64  Resp: 16     Temp: (!) 97.5 F (36.4 C) 98.9 F (37.2 C)    TempSrc:  Oral    SpO2: 99%  99% 98%  Weight:      Height:       Physical Exam General: Elderly woman, NAD. Room air Heart: RRR; no murmur Lungs: CTAB, no rales Abdomen: soft Extremities: 1+ BLE edema; 2+ L elbow region edema Dialysis Access: LUE AVF + bruit  Additional Objective Labs: Basic Metabolic Panel: Recent Labs  Lab 10/28/22 0906 10/30/22 0825 10/30/22 2303 10/31/22 0019 10/31/22 0804 11/01/22 1330  NA 130*   < > 133* 133* 132* 133*  K 3.5   < > 3.2* 3.3* 3.3* 3.5  CL 93*   < > 94* 95* 95* 94*  CO2 23   < > 28 26 26 26   GLUCOSE 149*   < > 119* 104* 101* 81  BUN 38*   < > 16 16 21  38*  CREATININE 4.23*   < > 2.53* 2.59* 2.85* 3.58*  CALCIUM 7.6*   < > 8.1* 8.2* 8.3* 8.2*  PHOS 6.5*  --  3.2  --   --  3.8   < > = values in this interval not displayed.   Liver Function Tests: Recent Labs  Lab 10/30/22 2303 10/31/22 0804 11/01/22 1330  AST  --  21  --   ALT  --  32  --   ALKPHOS  --  71  --   BILITOT  --  0.6  --   PROT  --  4.5*  --   ALBUMIN 2.1* 2.0* 2.0*   CBC: Recent Labs  Lab 10/28/22 0906 10/30/22 0825 10/30/22 2303 10/31/22 0804 11/01/22 1330  WBC 8.8 7.7 7.6 8.6 7.7  NEUTROABS  --   --   --  6.8  --   HGB 10.9* 10.5* 11.7* 11.2* 10.8*  HCT 33.4* 32.3* 35.1* 34.2* 33.4*  MCV 94.1 95.6 96.4 94.5 97.1  PLT 214 242 248 265 241   Medications:   (feeding supplement) PROSource Plus  30 mL Oral BID BM   acetaminophen  500 mg Oral TID   [START ON 11/04/2022] amiodarone  200 mg Oral Daily   aspirin  81 mg Oral Daily   Chlorhexidine Gluconate Cloth  6 each Topical Q0600    doxercalciferol  6 mcg Intravenous Q M,W,F-HD   insulin aspart  0-5 Units Subcutaneous QHS   insulin aspart  0-6 Units Subcutaneous TID WC   lidocaine  1 patch Transdermal Q24H   lipase/protease/amylase  36,000 Units Oral BID WC   meclizine  25 mg Oral TID   metoprolol succinate  25 mg Oral Daily   rOPINIRole  0.5 mg Oral QHS   rOPINIRole  0.5 mg Oral QHS   sevelamer carbonate  2,400 mg Oral TID WC   valACYclovir  500 mg Oral QHS   Dialysis Orders: MWF at [facility] 3.5hr, 350/A1.5, EDW 55kg, 2K/2C bath, L AVF, no heparin - Mircera was on hold at time of admit - Hectoral IV q HD   Assessment/Plan: Head injury/fall from bed -> R frontal hemorrhagic contusion, R frontal ischemic CVA, small B  SDHs, L posterior scalp hematoma, non-displaced L parietal-occipital bone fracture: Now in CIR ESRD: Continue HD on usual MWF schedule -> next HD 9/16. HTN/volume: BP fine but ongoing L arm and BLE edema on exam - will plan for ^ UFG with next HD. If L arm edema doesn't improve - may need to consider f'gram of her LUE AVF. Anemia of ESRD: Hgb 10.8, no ESA at this time. Secondary hyperparathyroidism:  CorrCa/Phos to goal - continue Renvela + VDRA. Nutrition: Alb low, continue supplements.  Ozzie Hoyle, PA-C 11/03/2022, 9:59 AM  [employer]

## 2022-11-03 NOTE — Plan of Care (Signed)
  Problem: Safety: Goal: Ability to remain free from injury will improve Outcome: Progressing   Problem: Skin Integrity: Goal: Risk for impaired skin integrity will decrease Outcome: Progressing   

## 2022-11-04 DIAGNOSIS — S069X9A Unspecified intracranial injury with loss of consciousness of unspecified duration, initial encounter: Secondary | ICD-10-CM | POA: Diagnosis not present

## 2022-11-04 LAB — CBC
HCT: 34.9 % — ABNORMAL LOW (ref 36.0–46.0)
Hemoglobin: 11.3 g/dL — ABNORMAL LOW (ref 12.0–15.0)
MCH: 30.9 pg (ref 26.0–34.0)
MCHC: 32.4 g/dL (ref 30.0–36.0)
MCV: 95.4 fL (ref 80.0–100.0)
Platelets: 264 10*3/uL (ref 150–400)
RBC: 3.66 MIL/uL — ABNORMAL LOW (ref 3.87–5.11)
RDW: 16.5 % — ABNORMAL HIGH (ref 11.5–15.5)
WBC: 10.2 10*3/uL (ref 4.0–10.5)
nRBC: 0 % (ref 0.0–0.2)

## 2022-11-04 LAB — RENAL FUNCTION PANEL
Albumin: 2.1 g/dL — ABNORMAL LOW (ref 3.5–5.0)
Anion gap: 11 (ref 5–15)
BUN: 48 mg/dL — ABNORMAL HIGH (ref 8–23)
CO2: 24 mmol/L (ref 22–32)
Calcium: 8.8 mg/dL — ABNORMAL LOW (ref 8.9–10.3)
Chloride: 95 mmol/L — ABNORMAL LOW (ref 98–111)
Creatinine, Ser: 4.08 mg/dL — ABNORMAL HIGH (ref 0.44–1.00)
GFR, Estimated: 10 mL/min — ABNORMAL LOW (ref >60)
Glucose, Bld: 154 mg/dL — ABNORMAL HIGH (ref 70–99)
Phosphorus: 4.1 mg/dL (ref 2.5–4.6)
Potassium: 4.2 mmol/L (ref 3.5–5.1)
Sodium: 130 mmol/L — ABNORMAL LOW (ref 135–145)

## 2022-11-04 LAB — GLUCOSE, CAPILLARY
Glucose-Capillary: 105 mg/dL — ABNORMAL HIGH (ref 70–99)
Glucose-Capillary: 136 mg/dL — ABNORMAL HIGH (ref 70–99)
Glucose-Capillary: 165 mg/dL — ABNORMAL HIGH (ref 70–99)

## 2022-11-04 MED ORDER — PENTAFLUOROPROP-TETRAFLUOROETH EX AERO: 1.0000 | INHALATION_SPRAY | CUTANEOUS | Status: DC | PRN

## 2022-11-04 NOTE — Progress Notes (Signed)
Occupational Therapy Session Note  Patient Details  Name: Kim Burns MRN: [id number] Date of Birth: 1936-01-03  Today's Date: 11/04/2022 OT Individual Time: 0800-0900 OT Individual Time Calculation (min): 60 min    Short Term Goals: Week 1:  OT Short Term Goal 1 (Week 1): Pt will tolerate sitting EOB for 5 mins in preparation for BADL tsaks OT Short Term Goal 2 (Week 1): Patient will complete toilet transfer with mod A of 1 OT Short Term Goal 3 (Week 1): Patient will complete 1 step of LB dressing task using LRAD  Skilled Therapeutic Interventions/Progress Updates:      Therapy Documentation Precautions:  Precautions Precautions: Fall, Other (comment) Precaution Comments: L lower abdominal drain, vertigo, orthostatic hypotension Restrictions Weight Bearing Restrictions: No LLE Weight Bearing: Weight bearing as tolerated General: "I just have trouble with this dizziness" Pt supine in bed upon OT arrival, agreeable to OT session.  Pain: no pain reported  ADL: Bed mobility: CGA supine>EOB, assistance with management of nephrostomy,m slight dizziness reported at EOB, rest break ~2 min for recovery Grooming: SBA seated EOB for washing face  Oral hygiene:SBA seated EOB for brushing teeth, able to manipulate containers Footwear: total A for donning TEDs and socks. Pt completed AE training with sock aide with demonstration. Pt completed task with SBA seated in W/C for donning socks with sock aide Transfers: CGA overall with RW for all transfers completed this date LB dressing: Pt completed simulated LB dressing with reacher in order to maximize independence with LB dressing. Pt used reacher and yellow theraband to thread "pants" onto feet and manage over waist. Pt completes CGA overall   Other Treatments: Pt educated on where to private purchase sock aide, reacher and long handled shoe horn in order for increased independence with ADLs. Pt also educated on leg lifter in order to  manage LLE.    Pt seated in W/C at end of session with W/C alarm donned, call light within reach and 4Ps assessed.    Therapy/Group: Individual Therapy Velia Meyer, OTD, OTR/L 11/04/2022, 4:20 PM

## 2022-11-04 NOTE — Progress Notes (Signed)
[facility] Progress Note   Subjective:  Seen in room. Very tired after therapy this morning. For dialysis today. LUE with edema, concern for stenosis.   Objective Vitals:   11/03/22 0243 11/03/22 1301 11/03/22 1900 11/04/22 0530  BP: 122/66 (!) 141/68 (!) 178/78 (!) 160/68  Pulse: 64 (!) 55  61  Resp:  15 17 18   Temp:  (!) 97.5 F (36.4 C) 97.6 F (36.4 C) 97.8 F (36.6 C)  TempSrc:  Oral    SpO2: 98% 100% 100% 99%  Weight:      Height:       Physical Exam General: Elderly woman, NAD. Room air Heart: RRR; no murmur Lungs: CTAB, no rales Abdomen: soft Extremities: 1+ BLE edema; 2+ L elbow region edema Dialysis Access: LUE AVF + bruit  Additional Objective Labs: Basic Metabolic Panel: Recent Labs  Lab 10/30/22 2303 10/31/22 0019 10/31/22 0804 11/01/22 1330  NA 133* 133* 132* 133*  K 3.2* 3.3* 3.3* 3.5  CL 94* 95* 95* 94*  CO2 28 26 26 26   GLUCOSE 119* 104* 101* 81  BUN 16 16 21  38*  CREATININE 2.53* 2.59* 2.85* 3.58*  CALCIUM 8.1* 8.2* 8.3* 8.2*  PHOS 3.2  --   --  3.8   Liver Function Tests: Recent Labs  Lab 10/30/22 2303 10/31/22 0804 11/01/22 1330  AST  --  21  --   ALT  --  32  --   ALKPHOS  --  71  --   BILITOT  --  0.6  --   PROT  --  4.5*  --   ALBUMIN 2.1* 2.0* 2.0*   CBC: Recent Labs  Lab 10/30/22 0825 10/30/22 2303 10/31/22 0804 11/01/22 1330  WBC 7.7 7.6 8.6 7.7  NEUTROABS  --   --  6.8  --   HGB 10.5* 11.7* 11.2* 10.8*  HCT 32.3* 35.1* 34.2* 33.4*  MCV 95.6 96.4 94.5 97.1  PLT 242 248 265 241   Medications:   (feeding supplement) PROSource Plus  30 mL Oral BID BM   acetaminophen  500 mg Oral TID   amiodarone  200 mg Oral Daily   aspirin  81 mg Oral Daily   Chlorhexidine Gluconate Cloth  6 each Topical Q0600   doxercalciferol  6 mcg Intravenous Q M,W,F-HD   insulin aspart  0-5 Units Subcutaneous QHS   insulin aspart  0-6 Units Subcutaneous TID WC   lidocaine  1 patch Transdermal Q24H    lipase/protease/amylase  36,000 Units Oral BID WC   meclizine  25 mg Oral TID   metoprolol succinate  25 mg Oral Daily   rOPINIRole  0.5 mg Oral QHS   rOPINIRole  0.5 mg Oral QHS   sevelamer carbonate  2,400 mg Oral TID WC   valACYclovir  500 mg Oral QHS   Dialysis Orders: MWF at [facility] 3.5hr, 350/A1.5, EDW 55kg, 2K/2C bath, L AVF, no heparin - Mircera was on hold at time of admit - Hectoral IV q HD   Assessment/Plan: Head injury/fall from bed -> R frontal hemorrhagic contusion, R frontal ischemic CVA, small B SDHs, L posterior scalp hematoma, non-displaced L parietal-occipital bone fracture: Now in CIR ESRD: Continue HD on usual MWF schedule -> next HD 9/16. HTN/volume: BP fine but ongoing L arm and BLE edema on exam - will plan for ^ UFG with next HD. Worsening LUE edema - will arrange for fistulogram while here. Less concerning for infection Anemia of ESRD: Hgb 10.8, no ESA at  this time. Secondary hyperparathyroidism:  CorrCa/Phos to goal - continue Renvela + VDRA. Nutrition: Alb low, continue supplements.  Tomasa Blase PA-C [facility] 11/04/2022,11:28 AM

## 2022-11-04 NOTE — Progress Notes (Signed)
PROGRESS NOTE   Subjective/Complaints: Sleepy this morning Pt reports dizzy this AM- attempted to get up but was unable to- didn't check BP since was so quick.  Things spinning, going "round and round".  Says no different than last 5-7 days.  LBM this AM? Overnight.  Has HD today per pt- but thought was Wednesday.   ROS:  Pt denies SOB, abd pain, CP, N/V/C/D, and vision changes  (+) dizziness Negative except for HPI Objective:   No results found. Recent Labs    11/01/22 1330  WBC 7.7  HGB 10.8*  HCT 33.4*  PLT 241   Recent Labs    11/01/22 1330  NA 133*  K 3.5  CL 94*  CO2 26  GLUCOSE 81  BUN 38*  CREATININE 3.58*  CALCIUM 8.2*    Intake/Output Summary (Last 24 hours) at 11/04/2022 0816 Last data filed at 11/03/2022 1325 Gross per 24 hour  Intake 200 ml  Output --  Net 200 ml     Pressure Injury 10/21/22 Buttocks Right Deep Tissue Pressure Injury - Purple or maroon localized area of discolored intact skin or blood-filled blister due to damage of underlying soft tissue from pressure and/or shear. 2 areas of deep tissue pressure in (Active)  10/21/22 1145  Location: Buttocks  Location Orientation: Right  Staging: Deep Tissue Pressure Injury - Purple or maroon localized area of discolored intact skin or blood-filled blister due to damage of underlying soft tissue from pressure and/or shear.  Wound Description (Comments): 2 areas of deep tissue pressure injuries  Present on Admission: Yes    Physical Exam: Vital Signs Blood pressure (!) 160/68, pulse 61, temperature 97.8 F (36.6 C), resp. rate 18, height 5\' 3"  (1.6 m), weight 55.9 kg, SpO2 99%.   General: awake, alert, appropriate, sitting up in bed; eating breakfast- almost 100% tray; NAD HENT: conjugate gaze; oropharynx moist CV: in Afib; rate borderline bradycardic; no JVD Pulmonary: CTA B/L; no W/R/R- good air movement GI: soft, NT, ND, (+)BS-  hypoactive Psychiatric: appropriate Neurological: alert Thought was Wednesday- calm Skin: R forearm IV- looks OK- fistula L upper arm- and severe swelling L forearm with associated bright erythema Neuro:  Alert and oriented x 3. Normal insight and awareness. Functional Memory. Normal language and speech. Cranial nerve exam unremarkable. MMT: RUE 4/5, LUE 4/5. RLE 3/5 prox to 4/5 distally. LLE 1/5 hip to 4/5 ADF/PF. Sensory exam normal for light touch and pain in all 4 limbs. No limb ataxia or cerebellar signs. No abnormal tone appreciated.  .   Musculoskeletal: Left hip tender with palpation and attempts at PROM. Left chest wall sore too    Assessment/Plan: 1. Functional deficits which require 3+ hours per day of interdisciplinary therapy in a comprehensive inpatient rehab setting. Physiatrist is providing close team supervision and 24 hour management of active medical problems listed below. Physiatrist and rehab team continue to assess barriers to discharge/monitor patient progress toward functional and medical goals  Care Tool:  Bathing    Body parts bathed by patient: Right arm, Chest, Left arm, Abdomen, Front perineal area, Buttocks, Face   Body parts bathed by helper: Right upper leg, Left upper leg, Right lower  leg, Left lower leg     Bathing assist Assist Level: Maximal Assistance - Patient 24 - 49%     Upper Body Dressing/Undressing Upper body dressing   What is the patient wearing?: Pull over shirt    Upper body assist Assist Level: Minimal Assistance - Patient > 75%    Lower Body Dressing/Undressing Lower body dressing      What is the patient wearing?: Pants     Lower body assist Assist for lower body dressing: Maximal Assistance - Patient 25 - 49%     Toileting Toileting Toileting Activity did not occur (Clothing management and hygiene only): N/A (no void or bm)  Toileting assist Assist for toileting: Total Assistance - Patient < 25%      Transfers Chair/bed transfer  Transfers assist  Chair/bed transfer activity did not occur: Safety/medical concerns (due to dizziness)  Chair/bed transfer assist level: Moderate Assistance - Patient 50 - 74%     Locomotion Ambulation   Ambulation assist      Assist level: Minimal Assistance - Patient > 75% Assistive device: Walker-rolling Max distance: 45'   Walk 10 feet activity   Assist     Assist level: Minimal Assistance - Patient > 75% Assistive device: Walker-rolling   Walk 50 feet activity   Assist Walk 50 feet with 2 turns activity did not occur: Safety/medical concerns         Walk 150 feet activity   Assist Walk 150 feet activity did not occur: Safety/medical concerns         Walk 10 feet on uneven surface  activity   Assist     Assist level: Minimal Assistance - Patient > 75% Assistive device: Other (comment) (2 handrails)   Wheelchair     Assist Is the patient using a wheelchair?: Yes Type of Wheelchair: Manual    Wheelchair assist level: Dependent - Patient 0% Max wheelchair distance: 150'    Wheelchair 50 feet with 2 turns activity    Assist        Assist Level: Dependent - Patient 0%   Wheelchair 150 feet activity     Assist          Blood pressure (!) 160/68, pulse 61, temperature 97.8 F (36.6 C), resp. rate 18, height 5\' 3"  (1.6 m), weight 55.9 kg, SpO2 99%.  Medical Problem List and Plan: 1. Functional deficits secondary to TBI and polytrauma due to a fall with associated Croop contrecoup brain injury, right frontal lobe hemorrhagic contusion and right frontal ischemic nonhemorrhagic infarct.             -patient may shower             -ELOS/Goals: 7-10 days, PT/OT/SLP mod I to supervision           Con't CIR PT, OT and SLP 2.  Antithrombotics: -DVT/anticoagulation:  Mechanical: Sequential compression devices, below knee Bilateral lower extremities             -antiplatelet therapy: ASA 3. Pain  Management: Tylenol prn.  4. Mood/Behavior/Sleep: LCSW to follow for evaluation and support.              -antipsychotic agents: N/A             --chronic insomnia. Naps on and off throughout the day. 5. Neuropsych/cognition: This patient is not fully capable of making decisions on her own behalf. 6. Skin/Wound Care: Routine pressure relief measures.  7. Fluids/Electrolytes/Nutrition: Strict I/O. Change to renal diet. Continue 1500  cc FR. -hypokalemia--will defer to renal team for replacement  8. A fib w/RVR/Acute systolic CHF: Fluid overload due to A fib and managed with HD.              --Duo nebs prn added   for SOB? 1500 cc FR/monitor weights daily.              --monitor HR TID--on amiodarone taper. Continue metoprolol Filed Weights   10/30/22 2112 10/31/22 0602  Weight: 55.4 kg 55.9 kg     9. HTN: Monitor BP TID--on metoprolol bid.  Monitor with mobility  9/12 controlled 10. ESRD: HD MWF at the end of the day to help with tolerance of therapy             --hx of intradialysis hypotension (midodrine in the past)  9/16- HD today- but renal looking at possible fistulogram? 11. Pancreatic insufficiency s/p Whipple: Continue Creon with meals  BID ac             --has dumping syndrome (abdominal pain with meals followed by diarrhea at times) 12. Left pubic rami/Sacral Ala at S4: WBAT LLE per Dr. Thad Ranger. Follow up Xrays in 2 weeks. 13. L-renal mass (likely RCC) urine leak s/p chronic nephrostomy tube: Tube exchanged out on 09/03 by IR per protocol             --not on AC due to bleeding from nephrostomy tube/kidney.  Measure output daily.   14. T2DM diet controlled w/peripheral neuropathy: Monitor BS ac/hs and use SSI for elevated BS.  --Hgb A1C- 6.1. will add CM restrictions.   15. Likely BPPV-- unable to do any basic testing d/t dizziness/sx today 9/12 -vestibular eval ordered--hopefully she can tolerate               --also check orthostatic vitals.       -changed meclizine to  scheduled for now, increased dose to 25mg  TID  -discussed with therapy plan for vestibular treatment on Tuesday  16. Anemia of CKD: Monitor with serial checks. H/H stable in 10 range. Add iron panel to HD labs  17. H/o Shingles w/optic nerve involvement: Continue Valtrex.  18. Restless leg syndrome: Continue Requip 0.5 mg HS with additional 0.25 mg prn during the day. Discussed that this medication could contribute to dizziness but patient feels it is unlikely because she has been taking it for a long time.   19. Bradycardia: resolved, continue to monitor HR TID  20. Orthostasis: continue teds with therapy only  9/16- pt still dizzy- will therapy- cannot increase BP because runnings 140s-170s in last 24 hours-   21. Resting hypertension: continue amiodarone and metoprolol  9/16- BP running 140s-170's- con't regimen for now since so dizzy/lightheaded  22. L forearm swelling and redness- appears like cellulitis?  9/16- d/w nursing, pharmacy and Renal- renal to assess and decide if needs fistulogram vs ABX vs both.   I spent a total of 57   minutes on total care today- >50% coordination of care- due to d/w 2 different nurses- to verify swelling/redness is new on L forearm- also d/w pharmacy about ABX- they suggested Cefazolin with HD- also d/w Dr Marisue Humble- they will take alook and let me know/make plans for treatment.   LOS: 5 days A FACE TO FACE EVALUATION WAS PERFORMED  Desirey Keahey 11/04/2022, 8:16 AM

## 2022-11-04 NOTE — Consult Note (Addendum)
CONSULT NOTE   MRN : [id number]  Reason for Consult: left UE edema Referring Physician: Susann Givens PA-C  History of Present Illness: 87 y/o female with ESRD.  She was admitted due to a fall causing head injury.  She has a left UE AV fistula BB AV fistula s/p second stage 2019 by Dr. Myra Gianotti.  No issues while on HD per patient she states she has has left volar edema  11/01/22 per the Nephrolgy notes. HD MWF without complications.     Current Facility-Administered Medications  Medication Dose Route Frequency Provider Last Rate Last Admin   (feeding supplement) PROSource Plus liquid 30 mL  30 mL Oral BID BM Julien Nordmann, PA-C   30 mL at 11/01/22 1220   acetaminophen (TYLENOL) tablet 325-650 mg  325-650 mg Oral Q4H PRN Jacquelynn Cree, PA-C   650 mg at 11/03/22 1113   acetaminophen (TYLENOL) tablet 500 mg  500 mg Oral TID Love, Pamela S, PA-C   500 mg at 11/04/22 4098   amiodarone (PACERONE) tablet 200 mg  200 mg Oral Daily Jacquelynn Cree, PA-C   200 mg at 11/04/22 1191   aspirin chewable tablet 81 mg  81 mg Oral Daily Jacquelynn Cree, PA-C   81 mg at 11/04/22 4782   bisacodyl (DULCOLAX) suppository 10 mg  10 mg Rectal Daily PRN Love, Pamela S, PA-C       bismuth subsalicylate (PEPTO BISMOL) 262 MG/15ML suspension 30 mL  30 mL Oral Q4H PRN Street, [place], [place]       calcium carbonate (TUMS - dosed in mg elemental calcium) chewable tablet 200 mg of elemental calcium  1 tablet Oral Q6H PRN Street, [place], PA-C   200 mg of elemental calcium at 11/03/22 2116   Chlorhexidine Gluconate Cloth 2 % PADS 6 each  6 each Topical Q0600 Oretha Milch, PA-C       cycloSPORINE (RESTASIS) 0.05 % ophthalmic emulsion 1 drop  1 drop Both Eyes Daily PRN Love, Pamela S, PA-C       diphenhydrAMINE (BENADRYL) capsule 25 mg  25 mg Oral Q6H PRN Love, Pamela S, PA-C       doxercalciferol (HECTOROL) injection 6 mcg  6 mcg Intravenous Q M,W,F-HD Julien Nordmann, PA-C   6 mcg at 11/01/22 1438    guaiFENesin (ROBITUSSIN) 100 MG/5ML liquid 5 mL  5 mL Oral Q4H PRN Jacquelynn Cree, PA-C   5 mL at 11/01/22 1512   guaiFENesin-dextromethorphan (ROBITUSSIN DM) 100-10 MG/5ML syrup 5-10 mL  5-10 mL Oral Q6H PRN Love, Pamela S, PA-C       insulin aspart (novoLOG) injection 0-5 Units  0-5 Units Subcutaneous QHS Love, Pamela S, PA-C       insulin aspart (novoLOG) injection 0-6 Units  0-6 Units Subcutaneous TID WC Jacquelynn Cree, PA-C   3 Units at 11/01/22 0816   ipratropium-albuterol (DUONEB) 0.5-2.5 (3) MG/3ML nebulizer solution 3 mL  3 mL Nebulization Q4H PRN Love, Pamela S, PA-C       lidocaine (LIDODERM) 5 % 1 patch  1 patch Transdermal Q24H Jacquelynn Cree, PA-C   1 patch at 11/03/22 1233   lipase/protease/amylase (CREON) capsule 36,000 Units  36,000 Units Oral BID WC Jacquelynn Cree, PA-C   36,000 Units at 11/04/22 9562   meclizine (ANTIVERT) tablet 25 mg  25 mg Oral TID Horton Chin, MD   25 mg at 11/04/22 0736   metoprolol succinate (TOPROL-XL) 24 hr tablet 25  mg  25 mg Oral Daily Jacquelynn Cree, PA-C   25 mg at 11/04/22 9147   naphazoline-glycerin (CLEAR EYES REDNESS) ophth solution 1-2 drop  1-2 drop Both Eyes QID PRN Love, Pamela S, PA-C       olopatadine (PATANOL) 0.1 % ophthalmic solution 1 drop  1 drop Both Eyes Daily PRN Love, Pamela S, PA-C       Oral care mouth rinse  15 mL Mouth Rinse PRN Love, Pamela S, PA-C       pentafluoroprop-tetrafluoroeth (GEBAUERS) aerosol 1 Application  1 Application Topical PRN Julien Nordmann, PA-C       prochlorperazine (COMPAZINE) tablet 5-10 mg  5-10 mg Oral Q6H PRN Love, Pamela S, PA-C       Or   prochlorperazine (COMPAZINE) suppository 12.5 mg  12.5 mg Rectal Q6H PRN Love, Pamela S, PA-C       Or   prochlorperazine (COMPAZINE) injection 5-10 mg  5-10 mg Intravenous Q6H PRN Love, Pamela S, PA-C       rOPINIRole (REQUIP) tablet 0.25 mg  0.25 mg Oral Daily PRN Love, Pamela S, PA-C   0.25 mg at 10/31/22 1007   rOPINIRole (REQUIP) tablet 0.25 mg   0.25 mg Oral Daily PRN Ranelle Oyster, MD   0.25 mg at 11/03/22 1335   rOPINIRole (REQUIP) tablet 0.5 mg  0.5 mg Oral QHS Love, Pamela S, PA-C   0.5 mg at 11/03/22 2116   rOPINIRole (REQUIP) tablet 0.5 mg  0.5 mg Oral QHS Ranelle Oyster, MD       senna-docusate (Senokot-S) tablet 1 tablet  1 tablet Oral QHS PRN Love, Pamela S, PA-C       sevelamer carbonate (RENVELA) tablet 2,400 mg  2,400 mg Oral TID WC Love, Pamela S, PA-C   2,400 mg at 11/04/22 0736   sodium phosphate (FLEET) enema 1 enema  1 enema Rectal Once PRN Love, Pamela S, PA-C       traZODone (DESYREL) tablet 25-50 mg  25-50 mg Oral QHS PRN Love, Pamela S, PA-C       valACYclovir (VALTREX) tablet 500 mg  500 mg Oral QHS Love, Pamela S, PA-C   500 mg at 11/02/22 2110    Pt meds include: Statin :No Betablocker: No ASA: Yes Other anticoagulants/antiplatelets: none  Past Medical History:  Diagnosis Date   Anemia of chronic disease    takes iron   Anxiety    Blood transfusion without reported diagnosis    Bradycardia    Diabetes mellitus without complication (HCC)    became diabetic after Whipple procedure   Diarrhea    Dysrhythmia 04/16/2016   bradycardia due to medication    ESRD on hemodialysis [facility])    M-W-F   GERD (gastroesophageal reflux disease)    Gout    Headache    History of kidney stones 06/2013   Hyperparathyroidism (HCC)    Hypertension    PONV (postoperative nausea and vomiting)    Sleep apnea    no cpap machine. could not tolerate   Stroke (HCC) 04/16/2016   TIA 1995   Vitamin D deficiency     Past Surgical History:  Procedure Laterality Date   A/V FISTULAGRAM Left 08/30/2021   Procedure: A/V Fistulagram;  Surgeon: Cephus Shelling, MD;  Location: [facility] INVASIVE CV LAB;  Service: Cardiovascular;  Laterality: Left;   ABDOMINAL HYSTERECTOMY  1985   complete   BACK SURGERY  1980   lower   BASCILIC VEIN TRANSPOSITION Left 04/24/2017  Procedure: LEFT ARM FIRST STAGE BASILIC VEIN  TRANSPOSITION;  Surgeon: Nada Libman, MD;  Location: [facility] OR;  Service: Vascular;  Laterality: Left;   BASCILIC VEIN TRANSPOSITION Left 07/10/2017   Procedure: SECOND STAGE BASILIC VEIN TRANSPOSITION LEFT ARM;  Surgeon: Nada Libman, MD;  Location: [facility] OR;  Service: Vascular;  Laterality: Left;   BREAST LUMPECTOMY Left x 2   many years apart, benign   CHOLECYSTECTOMY  1985   CYSTOSCOPY W/ URETERAL STENT PLACEMENT Left 10/31/2021   Procedure: CYSTOSCOPY WITH RETROGRADE PYELOGRAM/URETERAL STENT PLACEMENT;  Surgeon: Crist Fat, MD;  Location: [facility] OR;  Service: Urology;  Laterality: Left;   CYSTOSCOPY WITH URETEROSCOPY AND STENT PLACEMENT Left 06/18/2013   Procedure: CYSTOSCOPY WITH Lef URETEROSCOPY AND Left STENT PLACEMENT;  Surgeon: Crecencio Mc, MD;  Location: [facility] ORS;  Service: Urology;  Laterality: Left;   ESOPHAGOGASTRODUODENOSCOPY (EGD) WITH PROPOFOL N/A 11/13/2016   Procedure: ESOPHAGOGASTRODUODENOSCOPY (EGD) WITH PROPOFOL;  Surgeon: Kathi Der, MD;  Location: [facility] ENDOSCOPY;  Service: Gastroenterology;  Laterality: N/A;   EUS N/A 02/07/2016   Procedure: ESOPHAGEAL ENDOSCOPIC ULTRASOUND (EUS) RADIAL;  Surgeon: Willis Modena, MD;  Location: [facility] ENDOSCOPY;  Service: Endoscopy;  Laterality: N/A;   EYE SURGERY Bilateral 2014   ioc for catracts    FOOT SURGERY Left 1990   something with toes unsure what    HERNIA REPAIR  2018   IR CATHETER TUBE CHANGE  03/14/2022   IR EMBO TUMOR ORGAN ISCHEMIA INFARCT INC GUIDE ROADMAPPING  08/22/2021   IR NEPHROSTOMY EXCHANGE LEFT  06/27/2022   IR NEPHROSTOMY EXCHANGE LEFT  08/26/2022   IR NEPHROSTOMY EXCHANGE LEFT  10/22/2022   IR NEPHROSTOMY PLACEMENT LEFT  03/14/2022   IR RADIOLOGIST EVAL & MGMT  05/18/2021   IR RADIOLOGIST EVAL & MGMT  10/01/2021   IR RADIOLOGIST EVAL & MGMT  11/12/2021   IR RADIOLOGIST EVAL & MGMT  11/22/2021   IR RADIOLOGIST EVAL & MGMT  11/27/2021   IR RADIOLOGIST EVAL & MGMT  12/20/2021   IR RENAL SUPRASEL UNI S&I MOD SED   08/22/2021   IR US GUIDE VASC ACCESS RIGHT  08/22/2021   LEFT HEART CATH AND CORONARY ANGIOGRAPHY N/A 08/27/2021   Procedure: LEFT HEART CATH AND CORONARY ANGIOGRAPHY;  Surgeon: Kathleene Hazel, MD;  Location: [facility] INVASIVE CV LAB;  Service: Cardiovascular;  Laterality: N/A;   PARATHYROID EXPLORATION     PERIPHERAL VASCULAR BALLOON ANGIOPLASTY Left 08/30/2021   Procedure: PERIPHERAL VASCULAR BALLOON ANGIOPLASTY;  Surgeon: Cephus Shelling, MD;  Location: [facility] INVASIVE CV LAB;  Service: Cardiovascular;  Laterality: Left;  arm fistula   RADIOLOGY WITH ANESTHESIA Left 08/22/2021   Procedure: MICROWAVE ABLATION;  Surgeon: Simonne Come, MD;  Location: [facility] ORS;  Service: Anesthesiology;  Laterality: Left;   WHIPPLE PROCEDURE N/A 04/23/2016   Procedure: WHIPPLE PROCEDURE;  Surgeon: Almond Lint, MD;  Location: [facility] OR;  Service: General;  Laterality: N/A;    Social History Social History   Tobacco Use   Smoking status: Never   Smokeless tobacco: Never  Vaping Use   Vaping status: Never Used  Substance Use Topics   Alcohol use: No   Drug use: No    Family History Family History  Problem Relation Age of Onset   Heart disease Mother    Stroke Mother    Cancer Father        colon   Alzheimer's disease Sister    Alzheimer's disease Sister    Cancer Brother  brain   Breast cancer Neg Hx     Allergies  Allergen Reactions   Lopid [Gemfibrozil] Other (See Comments)    Myalgia    Lotemax [Loteprednol Etabonate] Rash    Skin burning  Blurry vision   Statins Other (See Comments)    Rosuvastatin Muscle weakness   Norco [Hydrocodone-Acetaminophen] Other (See Comments)    Headaches  Tolerates acetaminophen    Other Diarrhea    Real Butter - diarrhea   Vibra-Tab [Doxycycline] Other (See Comments)    Unknown reaction   Amlodipine Other (See Comments)    Norvasc   Codeine Other (See Comments)    headache   Hydrocodone Other (See Comments)   Hydrocodone-Acetaminophen Other (See  Comments)    Headache. Tolerates acetaminophen.   Lisinopril Other (See Comments)   Verapamil Other (See Comments)     REVIEW OF SYSTEMS  General: [ ]  Weight loss, [ ]  Fever, [ ]  chills Neurologic: [ ]  Dizziness, [ ]  Blackouts, [ ]  Seizure [ ]  Stroke, [ ]  "Mini stroke", [ ]  Slurred speech, [ ]  Temporary blindness; [ ]  weakness in arms or legs, [ ]  Hoarseness [ ]  Dysphagia Cardiac: [ ]  Chest pain/pressure, [ ]  Shortness of breath at rest [ ]  Shortness of breath with exertion, [ ]  Atrial fibrillation or irregular heartbeat  Vascular: [ ]  Pain in legs with walking, [ ]  Pain in legs at rest, [ ]  Pain in legs at night,  [ ]  Non-healing ulcer, [ ]  Blood clot in vein/DVT,   Pulmonary: [ ]  Home oxygen, [ ]  Productive cough, [ ]  Coughing up blood, [ ]  Asthma,  [ ]  Wheezing [ ]  COPD Musculoskeletal:  [ ]  Arthritis, [ ]  Low back pain, [ ]  Joint pain Hematologic: [ ]  Easy Bruising, [ ]  Anemia; [ ]  Hepatitis Gastrointestinal: [ ]  Blood in stool, [ ]  Gastroesophageal Reflux/heartburn, Urinary: [ ]  chronic Kidney disease, [ ]  on HD - [ ]  MWF or [ ]  TTHS, [ ]  Burning with urination, [ ]  Difficulty urinating Skin: [ ]  Rashes, [ ]  Wounds Psychological: [ ]  Anxiety, [ ]  Depression  Physical Examination Vitals:   11/03/22 0243 11/03/22 1301 11/03/22 1900 11/04/22 0530  BP: 122/66 (!) 141/68 (!) 178/78 (!) 160/68  Pulse: 64 (!) 55  61  Resp:  15 17 18   Temp:  (!) 97.5 F (36.4 C) 97.6 F (36.4 C) 97.8 F (36.6 C)  TempSrc:  Oral    SpO2: 98% 100% 100% 99%  Weight:      Height:       Body mass index is 21.83 kg/m.  General:  WDWN in NAD Gait: Normal HENT: WNL Eyes: Pupils equal Pulmonary: normal non-labored breathing , without Rales, rhonchi,  wheezing Cardiac: RRR, without  Murmurs, rubs or gallops; No carotid bruits Abdomen: soft, NT, no masses Skin: no rashes, ulcers noted;  no Gangrene , no cellulitis; no open wounds;     Volar SQ tissue edema  Vascular Exam/Pulses:palpable  thrill in the fistula non pulsatile, palpable radial pulse Left UE.     Musculoskeletal: no muscle wasting or atrophy; no edema  Neurologic: A&O X 3; Appropriate Affect ;  SENSATION: normal; MOTOR FUNCTION: 5/5 Symmetric Speech is fluent/normal   Significant Diagnostic Studies: CBC Lab Results  Component Value Date   WBC 7.7 11/01/2022   HGB 10.8 (L) 11/01/2022   HCT 33.4 (L) 11/01/2022   MCV 97.1 11/01/2022   PLT 241 11/01/2022    BMET    Component Value Date/Time  NA 133 (L) 11/01/2022 1330   K 3.5 11/01/2022 1330   CL 94 (L) 11/01/2022 1330   CO2 26 11/01/2022 1330   GLUCOSE 81 11/01/2022 1330   BUN 38 (H) 11/01/2022 1330   CREATININE 3.58 (H) 11/01/2022 1330   CREATININE 2.37 (H) 06/21/2022 1204   CALCIUM 8.2 (L) 11/01/2022 1330   CALCIUM 7.6 (L) 02/23/2021 1204   GFRNONAA 12 (L) 11/01/2022 1330   GFRNONAA 20 (L) 06/21/2022 1204   GFRAA 13 (L) 11/12/2019 1240   Estimated Creatinine Clearance: 9.5 mL/min (A) (by C-G formula based on SCr of 3.58 mg/dL (H)).  COAG Lab Results  Component Value Date   INR 1.0 03/14/2022   INR 1.4 (H) 01/22/2022   INR 1.2 11/01/2021     Non-Invasive Vascular Imaging: None  ASSESSMENT/PLAN:  ESRD with volar left arm SQ tissue edema.  The edema is not near the fistula and the fistula has a good palpable thrill that is not pulsatile.  No edema over the fistula and no reports of prolonged bleeding.    It maybe due to fluid infiltration? The edema does not connect to the fistula.  No indication for vascular intervention.  I would keep accessing the fistula.  Elevation light compression, do not compress the fistula.   Call with concerns as needed.   Mosetta Pigeon 11/04/2022 11:49 AM  I have independently interviewed and examined patient and agree with PA assessment and plan above.  Patient was on dialysis at the time my exam but she had a very strong thrill and there was no difficulty with access for dialysis today.  As  such would not plan for any intervention at this time and she can follow-up as needed in our office.  Cedra Villalon C. Randie Heinz, MD [facility] Office: [phone] Pager: [phone]

## 2022-11-04 NOTE — Plan of Care (Signed)
   11/04/22 0700  Behavioral Plan Guideline  [place]o Level VII-Automatic, Appropriate (oriented x4)  Behavior to decrease/eliminate Increase arousal; Improve pain  Changes to environment Lights on,  blinds open during the day,  off and closed at night  Interventions Bed alarm setting; # of rails up  Bed alarm setting Low sensitivity  # of rails up 3  Recommendations for interactions with patient Offer therapy outside  In Attendance at Behavior Plan Meeting  PT;SLP;RN

## 2022-11-04 NOTE — Progress Notes (Signed)
Physical Therapy Session Note  Patient Details  Name: JAMONICA BOVEY MRN: [id number] Date of Birth: [birth year]  Today's Date: 11/04/2022 PT Individual Time: 0920-1015 PT Individual Time Calculation (min): 55 min   Short Term Goals: Week 1:  PT Short Term Goal 1 (Week 1): Pt will perform bed mobility with CGA. PT Short Term Goal 2 (Week 1): Pt will perform sit to stand with CGA. PT Short Term Goal 3 (Week 1): PT will perform bed to chair with CGA consistently. PT Short Term Goal 4 (Week 1): Pt will ambulate x50' with CGA and LRAD.  Skilled Therapeutic Interventions/Progress Updates:     Pt received seated in WC asleep. Easily awakens and agrees to therapy. Reports some pain in Lt hip. PT provides mobility and rest breaks to manage pain. Pt reports that ambulation and movement seems to help pain levels. WC transport to gym. Pt performs sit to stand with minA and cues for positioning, initiation, and sequencing. Pt ambulates x90 with RW, slowly and with CGA ,with cues for posture and increasing gait speed to decrease risk for falls. Pt takes extended seated rest break. Pt stands with minA and same cues, then ambulates x175', with gradually improving gait pattern as distance increases. Pt does verbalize significant fatigue toward end of bout. WC transport back to room. Left seated in WC with alarm intact and all needs within reach.   Therapy Documentation Precautions:  Precautions Precautions: Fall, Other (comment) Precaution Comments: L lower abdominal drain, vertigo, orthostatic hypotension Restrictions Weight Bearing Restrictions: Yes LLE Weight Bearing: Weight bearing as tolerated    Therapy/Group: Individual Therapy  Beau Fanny, PT, DPT 11/04/2022, 3:56 PM

## 2022-11-04 NOTE — Progress Notes (Signed)
Behavior plan completed and provided copy to charge nurse and other copy placed on patient's door in room.  Notified nurse of behavior plan completion.

## 2022-11-04 NOTE — Procedures (Signed)
HD Note:  Some information was entered later than the data was gathered due to patient care needs. The stated time with the data is accurate.  Received patient in bed to unit.   Alert and oriented.   Informed consent signed and in chart.   Access used: upper left arm fistula Access issues: None  Patient BP went below desired limits.  UF turned off and NS given.Treatment resumed when BP recovered.  See Flowsheet.  TX duration: 3.5 hours  Alert, without acute distress.  Total UF removed: 1400 ml.  The NS that was given is part of this calculation  Hand-off given to patient's nurse.   Transported back to the room   Katherene Dinino L. Dareen Piano, RN [facility].

## 2022-11-05 DIAGNOSIS — S069X1D Unspecified intracranial injury with loss of consciousness of 30 minutes or less, subsequent encounter: Secondary | ICD-10-CM | POA: Diagnosis not present

## 2022-11-05 DIAGNOSIS — S069X9S Unspecified intracranial injury with loss of consciousness of unspecified duration, sequela: Secondary | ICD-10-CM

## 2022-11-05 DIAGNOSIS — I4891 Unspecified atrial fibrillation: Secondary | ICD-10-CM | POA: Diagnosis not present

## 2022-11-05 DIAGNOSIS — S32402D Unspecified fracture of left acetabulum, subsequent encounter for fracture with routine healing: Secondary | ICD-10-CM | POA: Diagnosis not present

## 2022-11-05 DIAGNOSIS — N186 End stage renal disease: Secondary | ICD-10-CM | POA: Diagnosis not present

## 2022-11-05 DIAGNOSIS — S069X9D Unspecified intracranial injury with loss of consciousness of unspecified duration, subsequent encounter: Secondary | ICD-10-CM

## 2022-11-05 LAB — GLUCOSE, CAPILLARY
Glucose-Capillary: 94 mg/dL (ref 70–99)
Glucose-Capillary: 140 mg/dL — ABNORMAL HIGH (ref 70–99)
Glucose-Capillary: 235 mg/dL — ABNORMAL HIGH (ref 70–99)
Glucose-Capillary: 150 mg/dL — ABNORMAL HIGH (ref 70–99)

## 2022-11-05 MED ORDER — SEVELAMER CARBONATE 800 MG PO TABS
800.0000 mg | ORAL_TABLET | Freq: Three times a day (TID) | ORAL | Status: DC
  Administered 2022-11-05 – 2022-11-16 (×30): 800 mg via ORAL
  Filled 2022-11-05 (×30): qty 1

## 2022-11-05 NOTE — Progress Notes (Signed)
Kim Burns Progress Note   Subjective:  Seen in room. Completed dialysis yesterday net UF 1.4L. No issue with AVF.   Objective Vitals:   11/04/22 1735 11/04/22 1753 11/04/22 2111 11/05/22 0630  BP: (!) 116/58 (!) 150/66 128/63 (!) 147/76  Pulse: (!) 38 (!) 57 (!) 52 (!) 58  Resp: 15 11 17 17   Temp:  (!) 97.3 F (36.3 C) (!) 97.5 F (36.4 C) 98.5 F (36.9 C)  TempSrc:    Oral  SpO2: 92% 100% 100% 99%  Weight:      Height:       Physical Exam General: Elderly woman, NAD. Room air Heart: RRR; no murmur Lungs: CTAB, no rales Abdomen: soft Extremities: 1+ BLE edema; 2+ L elbow region edema Dialysis Access: LUE AVF + bruit  Additional Objective Labs: Basic Metabolic Panel: Recent Labs  Lab 10/30/22 2303 10/31/22 0019 10/31/22 0804 11/01/22 1330 11/04/22 1159  NA 133*   < > 132* 133* 130*  K 3.2*   < > 3.3* 3.5 4.2  CL 94*   < > 95* 94* 95*  CO2 28   < > 26 26 24   GLUCOSE 119*   < > 101* 81 154*  BUN 16   < > 21 38* 48*  CREATININE 2.53*   < > 2.85* 3.58* 4.08*  CALCIUM 8.1*   < > 8.3* 8.2* 8.8*  PHOS 3.2  --   --  3.8 4.1   < > = values in this interval not displayed.   Liver Function Tests: Recent Labs  Lab 10/31/22 0804 11/01/22 1330 11/04/22 1159  AST 21  --   --   ALT 32  --   --   ALKPHOS 71  --   --   BILITOT 0.6  --   --   PROT 4.5*  --   --   ALBUMIN 2.0* 2.0* 2.1*   CBC: Recent Labs  Lab 10/30/22 0825 10/30/22 2303 10/31/22 0804 11/01/22 1330 11/04/22 1159  WBC 7.7 7.6 8.6 7.7 10.2  NEUTROABS  --   --  6.8  --   --   HGB 10.5* 11.7* 11.2* 10.8* 11.3*  HCT 32.3* 35.1* 34.2* 33.4* 34.9*  MCV 95.6 96.4 94.5 97.1 95.4  PLT 242 248 265 241 264   Medications:   (feeding supplement) PROSource Plus  30 mL Oral BID BM   acetaminophen  500 mg Oral TID   amiodarone  200 mg Oral Daily   aspirin  81 mg Oral Daily   Chlorhexidine Gluconate Cloth  6 each Topical Q0600   doxercalciferol  6 mcg Intravenous Q M,W,F-HD   insulin  aspart  0-5 Units Subcutaneous QHS   insulin aspart  0-6 Units Subcutaneous TID WC   lidocaine  1 patch Transdermal Q24H   lipase/protease/amylase  36,000 Units Oral BID WC   meclizine  25 mg Oral TID   metoprolol succinate  25 mg Oral Daily   rOPINIRole  0.5 mg Oral QHS   rOPINIRole  0.5 mg Oral QHS   sevelamer carbonate  2,400 mg Oral TID WC   valACYclovir  500 mg Oral QHS   Dialysis Orders: MWF at [facility] 3.5hr, 350/A1.5, EDW 55kg, 2K/2C bath, L AVF, no heparin - Mircera was on hold at time of admit - Hectoral IV q HD   Assessment/Plan: Head injury/fall from bed -> R frontal hemorrhagic contusion, R frontal ischemic CVA, small B SDHs, L posterior scalp hematoma, non-displaced L parietal-occipital bone fracture: Now in  CIR ESRD: Continue HD on usual MWF schedule -> next HD 9/18. HTN/volume: BP fine but ongoing L arm and BLE edema on exam - will plan for ^ UFG with next HD. Dialysis access: Worsening LUE edema raising concern for stenosis.  Less concerning for infection. Appreciate VVS evaluation - no need for intervention at this time.  Anemia of ESRD: Hgb 10.8, no ESA at this time. Secondary hyperparathyroidism:  CorrCa/Phos to goal - continue Renvela + VDRA. Nutrition: Alb low, continue supplements.  Kim Blase PA-C [facility] 11/05/2022,12:37 PM

## 2022-11-05 NOTE — Progress Notes (Signed)
Patient ID: Kim Burns, female   DOB: 01/15/1936, 87 y.o.   MRN: [id number]  1146- SW left message for pt son [name] to provide updates from team conference, inform on ELOS 9/21, and HH therapies recommended. SW requested return phone call to discuss discharge.   SW met with pt in room to inform on above.   SW updated Tracy Mounce/dialysis coord and Kelly/[facility] on d/c date and HHPT/OT/SLP/aide needed.   Cecile Sheerer, MSW, LCSWA Office: [phone] Cell: [phone] Fax: [phone]

## 2022-11-05 NOTE — Progress Notes (Signed)
Occupational Therapy Session Note  Patient Details  Name: Kim Burns MRN: [id number] Date of Birth: Oct 31, 1935  Today's Date: 11/05/2022 OT Individual Time: 2841-3244 OT Individual Time Calculation (min): 45 min    Short Term Goals: Week 1:  OT Short Term Goal 1 (Week 1): Pt will tolerate sitting EOB for 5 mins in preparation for BADL tsaks OT Short Term Goal 2 (Week 1): Patient will complete toilet transfer with mod A of 1 OT Short Term Goal 3 (Week 1): Patient will complete 1 step of LB dressing task using LRAD  Skilled Therapeutic Interventions/Progress Updates:   Pt seen for skilled OT session with pt asleep up in w/c upon OT arrival. Denied need for toileting but agreeable to full self care sink side including seated level grooming, sponge bathing, dressing with RW for standing support for LB. Overall, seated grooming with set up, UB sponge bathing and dressing pull over shirt with increased time and set up, LB sponge bathing and dressing mod-max A with initiation of AE incl reacher and LH shoe horn excluding thigh high TEDs for BP mngt. Sit to stand with cues and increased time with mod fading to min A up to 1 min tolerance. Left pt up in w/c with all safety needs, nurse call button and needs within reach.   Pain: denies pain reports intermittent dizziness and fatigue only this session  Therapy Documentation Precautions:  Precautions Precautions: Fall, Other (comment) Precaution Comments: L lower abdominal drain, vertigo, orthostatic hypotension Restrictions Weight Bearing Restrictions: Yes LLE Weight Bearing: Weight bearing as tolerated   Therapy/Group: Individual Therapy  Vicenta Dunning 11/05/2022, 7:57 AM

## 2022-11-05 NOTE — Patient Care Conference (Signed)
Inpatient RehabilitationTeam Conference and Plan of Care Update Date: 11/05/2022   Time: 10:05 AM    Patient Name: Kim Burns      Medical Record Number: [id number]  Date of Birth: Oct 15, 1935 Sex: Female         Room/Bed: 4W14C/4W14C-01 Payor Info: Payor: [employer] MEDICARE / Plan: [employer] MEDICARE CHOICE PPO / Product Type: *No Product type* /    Admit Date/Time:  10/30/2022  9:16 PM  Primary Diagnosis:  TBI (traumatic brain injury) [facility])  Hospital Problems: Principal Problem:   TBI (traumatic brain injury) [facility])    Expected Discharge Date: Expected Discharge Date: 11/09/22  Team Members Present: Physician leading conference: Dr. Faith Rogue Social Worker Present: Cecile Sheerer, LCSWA Nurse Present: Vedia Pereyra, RN PT Present: Malachi Pro, PT OT Present: Roney Mans, OT SLP Present: Feliberto Gottron, SLP PPS Coordinator present : Fae Pippin, SLP     Current Status/Progress Goal Weekly Team Focus  Bowel/Bladder   continent of b/b; LBM: 9/17   remain continent of b/b   assisst with toileting needs prn    Swallow/Nutrition/ Hydration               ADL's   Overall, seated grooming with set up, UB sponge bathing and dressing pull over shirt with increased time and set up, LB sponge bathing and dressing mod-max A with initiation of AE incl reacher and LH shoe horn excluding thigh high TEDs for BP mngt. Sit to stand with cues and increased time with mod fading to min A up to 1 min tolerance. Min A SPT to commode.   Supervision   Increase overall activity tolerance and standing as well as functional reach with AE to LB for self care    Mobility   minA bed mobility, minA sit to stand transfer, gait x175' with RW   Supervision  Increased arrousal, ambulation, endurance, transfers    Communication                Safety/Cognition/ Behavioral Observations  Min A   Supervision   functional problem solving, recall with use of strategies    Pain   no  c/o pain   remain pain free   assess pain QS and prn    Skin   DTI buttocks   reamin free of new skin breakdown/infection  assess skin QS and prn      Discharge Planning:  Pt will d/c to home with support from her son. Pt dtr will come up a few times throughout the year to assist. SW will confirm there are no barriers to discharge.   Team Discussion: TBI with polytrauma. [place] level VII. Behavior Plan.  Time toileting. Wound care plan. HD M/W/F. Vertigo. Fatigues quickly. DTI to right buttocks with attached edges, no drainage. Nephrostomy drain with output.  Will go home with drain in place. Left arm swelling but below fistula site. Daily weight. Tolerating heart healthy, carb-mod diet with 1500 fluid restriction. AC/HS. Sleep chart.  3 step skin care. SCDs. Ted hose. Therapies barriers are lethargy/fatigue. Does report pain to hip during therapy sessions.  Patient on target to meet rehab goals: Continues to progress toward goals with discharge of 11/09/22  *See Care Plan and progress notes for long and short-term goals.   Revisions to Treatment Plan:  Vestibular evaluation.  Monitor labs/VS Teaching Needs: Medications, safety, self care, drain care, gait/transfer training, etc.   Current Barriers to Discharge: Decreased caregiver support, Wound care, and Behavior  Possible Resolutions to  Barriers: Family education Independent with nephrostomy tube Improve endurance Order recommended DME     Medical Summary Current Status: TBI with polytrauma, left PR fx/sacral fx, significant vertigo. nephrostomy in place. orthostatic  Barriers to Discharge: Medical stability   Possible Resolutions to [employer] Focus: daily assessment of skin/wounds, vestibular rx, ongoing mgt of electrolytes and renal in HD   Continued Need for Acute Rehabilitation Level of Care: The patient requires daily medical management by a physician with specialized training in physical medicine and  rehabilitation for the following reasons: Direction of a multidisciplinary physical rehabilitation program to maximize functional independence : Yes Medical management of patient stability for increased activity during participation in an intensive rehabilitation regime.: Yes Analysis of laboratory values and/or radiology reports with any subsequent need for medication adjustment and/or medical intervention. : Yes   I attest that I was present, lead the team conference, and concur with the assessment and plan of the team.   Jearld Adjutant 11/05/2022, 1:17 PM

## 2022-11-05 NOTE — Progress Notes (Signed)
Physical Therapy BI Session Note  Patient Details  Name: Kim Burns MRN: [id number] Date of Birth: 1935/12/17  Today's Date: 11/05/2022 PT Individual Time: 0849-1000 PT Individual Time Calculation (min): 71 min   Short Term Goals: Week 1:  PT Short Term Goal 1 (Week 1): Pt will perform bed mobility with CGA. PT Short Term Goal 2 (Week 1): Pt will perform sit to stand with CGA. PT Short Term Goal 3 (Week 1): PT will perform bed to chair with CGA consistently. PT Short Term Goal 4 (Week 1): Pt will ambulate x50' with CGA and LRAD.  Skilled Therapeutic Interventions/Progress Updates:    Pt received supported upright in bed with MD present for morning rounds inquiring about pt's vestibular evaluation - updated it was initiated on Saturday and planning for follow-up today. Pt agreeable to therapy session with plan for follow-up on vestibular assessment.  Pt reports her symptoms have improved since Saturday, but she is still experiencing them with movement. Pt still with some difficulty clarifying her symptoms; however, overall is moving more quickly and less guarded today during functional mobility compared to on Saturday.  Vitals assessed as follows:  - supported upright in bed: BP 122/55 (MAP 74), HR 57bpm  - sitting EOB: BP 120/64 (MAP 81), HR 57bpm  with pt reporting 8/10 dizziness described as "feeling weak" and "feel like I'm gone" that improves in <83minute, anticipate this directional movement is stimulating her R anterior canal and correlates to why she had positive L Dix Hallpike; donned thigh high TEDs in sitting (asked secretary to order shorter size for improved pt fit) - Standing vitals: BP 117/63 (MAP 80), HR 60bpm with no reports of dizziness  Supine>sitting R EOB, HOB partially elevated and using bedrail, with skilled min A for bringing trunk upright and rotating hips using bed pad.  Sit<>stands using RW with min assist for lifting to stand and CGA for steadying during  descent, pt continuing to rely on B UE support to control this movement.  L stand pivot EOB>w/c using RW with light min A for steadying and cuing for sequencing.   Transported to/from gym in w/c for time management and energy conservation and to perform vestibular eval on mat table due to pt having an air mattress.   Stand pivot w/c>EOM using RW with light min A as described above.   Vestibular Reassessment/Follow-up: - R Dix Hallpike: reports symptoms, but not specifically room spinning. Therapist continues to have difficulty seeing nystagmus due to pt tightly clenching eyes shut despite therapist attempts at gently opening her eyelid, symptoms continue to last >30 seconds -- Treated with Epley maneuver - L Dix Hallpike: positive with pt reporting "oh, the room is spinning" and therapist continues to have difficulty seeing nystagmus, but may have been present -- Treated with Epley maneuver and pt reporting symptomatic throughout the repositioning maneuver  Pt reports she cannot tolerate reassessment of L Dix Hallpike today due to severity of symptoms. Notified primary PT of assessment findings and to follow-up on pt's symptoms tomorrow and re-consult this therapist if needed on Thursday 9/19.  Gait training ~130ft back towards room using RW with CGA/light min A and +2 w/c follow in event of fatigue - slow gait speed, but achieves reciprocal stepping pattern - pt reports moving helps "loosen up" her L hip and decrease her pain.  Pt overstimulated at this time and politely requesting to be "left alone."  Transported back to room and pt left seated in w/c with needs in reach  and seat belt alarm on.    Therapy Documentation Precautions:  Precautions Precautions: Fall, Other (comment) Precaution Comments: L lower abdominal drain, vertigo, orthostatic hypotension Restrictions Weight Bearing Restrictions: Yes LLE Weight Bearing: Weight bearing as tolerated   Pain:  Continues to have L hip  pain but states it improves with walking, premedicated.  Agitated Behavior Scale: TBI Observation Details Observation Environment: CIR Start of observation period - Date: 11/05/22 Start of observation period - Time: 0849 End of observation period - Date: 11/05/22 End of observation period - Time: 1000 Agitated Behavior Scale (DO NOT LEAVE BLANKS) Short attention span, easy distractibility, inability to concentrate: Present to a slight degree Impulsive, impatient, low tolerance for pain or frustration: Present to a slight degree Uncooperative, resistant to care, demanding: Absent Violent and/or threatening violence toward people or property: Absent Explosive and/or unpredictable anger: Absent Rocking, rubbing, moaning, or other self-stimulating behavior: Absent Pulling at tubes, restraints, etc.: Absent Wandering from treatment areas: Absent Restlessness, pacing, excessive movement: Absent Repetitive behaviors, motor, and/or verbal: Absent Rapid, loud, or excessive talking: Absent Sudden changes of mood: Present to a slight degree Easily initiated or excessive crying and/or laughter: Absent Self-abusiveness, physical and/or verbal: Absent Agitated behavior scale total score: 17  Therapy/Group: Individual Therapy  Ginny Forth , PT, DPT, NCS, CSRS 11/05/2022, 7:51 AM

## 2022-11-05 NOTE — Progress Notes (Signed)
Physical Therapy Session Note  Patient Details  Name: Kim Burns MRN: [id number] Date of Birth: Jul 14, 1935  Today's Date: 11/05/2022 PT Individual Time: 1417-1500 PT Individual Time Calculation (min): 43 min   Short Term Goals: Week 1:  PT Short Term Goal 1 (Week 1): Pt will perform bed mobility with CGA. PT Short Term Goal 2 (Week 1): Pt will perform sit to stand with CGA. PT Short Term Goal 3 (Week 1): PT will perform bed to chair with CGA consistently. PT Short Term Goal 4 (Week 1): Pt will ambulate x50' with CGA and LRAD.  Skilled Therapeutic Interventions/Progress Updates:     Pt received seated in [facility] and agrees to therapy. Reports ain in Lt hip. PT provides mobility and rest breaks to manage pain. WC transport to gym for time management. Pt requires modA for initial sit to stand, with facilitation of anterior weight shifting and powering up. Pt ambulates x15' to Nustep with CGA and cues for sequencing and positioning. Pt completes Nustep for endurance training as well as providing gentle ROM of LLE. Pt completes x10:00 at workload of 4 with average steps per minute ~45. Pt then ambulates x175' with RW and CGA, with cues for increasing stride length, gait speed, and upright posture to improve body mechanics and energy conservation. Pt ambulates additional bout of x175' following extended seated rest break, with same cues and assistance provided. Pt left seated in WC with alarm intact and all needs within reach.   Therapy Documentation Precautions:  Precautions Precautions: Fall, Other (comment) Precaution Comments: L lower abdominal drain, vertigo, orthostatic hypotension Restrictions Weight Bearing Restrictions: Yes LLE Weight Bearing: Weight bearing as tolerated   Therapy/Group: Individual Therapy  Beau Fanny, PT, DPT 11/05/2022, 3:31 PM

## 2022-11-05 NOTE — Progress Notes (Signed)
Speech Language Pathology TBI Note  Patient Details  Name: Kim Burns MRN: [id number] Date of Birth: Feb 12, 1936  Today's Date: 11/05/2022 SLP Individual Time: 1330-1415 SLP Individual Time Calculation (min): 45 min  Short Term Goals: Week 1: SLP Short Term Goal 1 (Week 1): Patient will utilize memory compensatory strategies to recall daily, functional information with Min verbal cues. SLP Short Term Goal 2 (Week 1): Patient will utilize external aids for recall of date with Mod I. SLP Short Term Goal 3 (Week 1): Patient will demonstrate functional problem solving for basic and familiar tasks with Min verbal cues.  Skilled Therapeutic Interventions: Skilled treatment session focused on cognitive goals. Upon arrival, patient was awake while upright in the wheelchair. SLP facilitated session by providing overall Min verbal cues for problem solving regarding specificity while transferring information regarding appointments onto a calendar. Patient also participated in a recall task regarding her current medications with overall Min verbal cues for recall regarding their functions.  Patient with mildly impaired mental flexibility regarding home medications vs current medications. Will plan to organize a pill box during upcoming treatment session. Patient left upright in wheelchair with alarm on and all needs within reach. Continue with current plan of care.      Pain No/Denies Pain   Agitated Behavior Scale: TBI Observation Details Observation Environment: CIR Start of observation period - Date: 11/05/22 Start of observation period - Time: 1330 End of observation period - Date: 11/05/22 End of observation period - Time: 1415 Agitated Behavior Scale (DO NOT LEAVE BLANKS) Short attention span, easy distractibility, inability to concentrate: Absent Impulsive, impatient, low tolerance for pain or frustration: Absent Uncooperative, resistant to care, demanding: Absent Violent and/or  threatening violence toward people or property: Absent Explosive and/or unpredictable anger: Absent Rocking, rubbing, moaning, or other self-stimulating behavior: Absent Pulling at tubes, restraints, etc.: Absent Wandering from treatment areas: Absent Restlessness, pacing, excessive movement: Absent Repetitive behaviors, motor, and/or verbal: Absent Rapid, loud, or excessive talking: Absent Sudden changes of mood: Absent Easily initiated or excessive crying and/or laughter: Absent Self-abusiveness, physical and/or verbal: Absent Agitated behavior scale total score: 14  Therapy/Group: Individual Therapy  Demetres Prochnow 11/05/2022, 3:04 PM

## 2022-11-05 NOTE — Progress Notes (Signed)
Contacted by rehab CSW today with pt's d/c date of 9/21. Contacted [facility] to provide update regarding pt's d/c date from rehab. Clinic advised pt should resume care on Monday, Sept 23. Will assist as needed.   Olivia Canter Renal Navigator [phone]

## 2022-11-05 NOTE — Progress Notes (Signed)
PROGRESS NOTE   Subjective/Complaints: Pt in bed. Says she feels dizzy when she turns her head. PT back in to do some more vestibular work with her. Left arm still swollen. She says it gets compressed during HD which has caused swelling and redness/bruising.   ROS: Patient denies fever, rash, sore throat, blurred vision,  nausea, vomiting, diarrhea, cough, shortness of breath or chest pain,  back/neck pain, headache, or mood change.    Objective:   No results found. Recent Labs    11/04/22 1159  WBC 10.2  HGB 11.3*  HCT 34.9*  PLT 264   Recent Labs    11/04/22 1159  NA 130*  K 4.2  CL 95*  CO2 24  GLUCOSE 154*  BUN 48*  CREATININE 4.08*  CALCIUM 8.8*    Intake/Output Summary (Last 24 hours) at 11/05/2022 0923 Last data filed at 11/05/2022 0844 Gross per 24 hour  Intake 118 ml  Output 1550 ml  Net -1432 ml     Pressure Injury 10/21/22 Buttocks Right Deep Tissue Pressure Injury - Purple or maroon localized area of discolored intact skin or blood-filled blister due to damage of underlying soft tissue from pressure and/or shear. 2 areas of deep tissue pressure in (Active)  10/21/22 1145  Location: Buttocks  Location Orientation: Right  Staging: Deep Tissue Pressure Injury - Purple or maroon localized area of discolored intact skin or blood-filled blister due to damage of underlying soft tissue from pressure and/or shear.  Wound Description (Comments): 2 areas of deep tissue pressure injuries  Present on Admission: Yes    Physical Exam: Vital Signs Blood pressure (!) 147/76, pulse (!) 58, temperature 98.5 F (36.9 C), temperature source Oral, resp. rate 17, height 5\' 3"  (1.6 m), weight 55.9 kg, SpO2 99%.   Constitutional: No distress . Vital signs reviewed. HEENT: NCAT, EOMI, oral membranes moist Neck: supple Cardiovascular: RRR without murmur. No JVD    Respiratory/Chest: CTA Bilaterally without wheezes or  rales. Normal effort    GI/Abdomen: BS +, non-tender, non-distended Ext: no clubbing, cyanosis, or edema Psych: pleasant and cooperative  Skin: Left arm swelling, crepey skin with redness/bruising below AVF. Area sl warm. Nephrostomy tube/drain in place.  Neuro:  Alert and oriented x 2-3. Normal insight and awareness. Fair to functional Memory. Normal language and speech. Cranial nerve exam unremarkable. MMT: RUE 4/5, LUE 4/5. RLE 3/5 prox to 4/5 distally. LLE 1/5 hip to 4/5 ADF/PF. Sensory exam normal for light touch and pain in all 4 limbs. No limb ataxia. Did not see nystagmus but I didn't turn her head either  Musculoskeletal: Left hip tender with palpation and attempts at PROM. Left chest wall sore too    Assessment/Plan: 1. Functional deficits which require 3+ hours per day of interdisciplinary therapy in a comprehensive inpatient rehab setting. Physiatrist is providing close team supervision and 24 hour management of active medical problems listed below. Physiatrist and rehab team continue to assess barriers to discharge/monitor patient progress toward functional and medical goals  Care Tool:  Bathing    Body parts bathed by patient: Right arm, Chest, Left arm, Abdomen, Front perineal area, Buttocks, Face   Body parts bathed by helper:  Right upper leg, Left upper leg, Right lower leg, Left lower leg     Bathing assist Assist Level: Maximal Assistance - Patient 24 - 49%     Upper Body Dressing/Undressing Upper body dressing   What is the patient wearing?: Pull over shirt    Upper body assist Assist Level: Minimal Assistance - Patient > 75%    Lower Body Dressing/Undressing Lower body dressing      What is the patient wearing?: Pants     Lower body assist Assist for lower body dressing: Maximal Assistance - Patient 25 - 49%     Toileting Toileting Toileting Activity did not occur (Clothing management and hygiene only): N/A (no void or bm)  Toileting assist Assist for  toileting: Total Assistance - Patient < 25%     Transfers Chair/bed transfer  Transfers assist  Chair/bed transfer activity did not occur: Safety/medical concerns (due to dizziness)  Chair/bed transfer assist level: Moderate Assistance - Patient 50 - 74%     Locomotion Ambulation   Ambulation assist      Assist level: Minimal Assistance - Patient > 75% Assistive device: Walker-rolling Max distance: 45'   Walk 10 feet activity   Assist     Assist level: Minimal Assistance - Patient > 75% Assistive device: Walker-rolling   Walk 50 feet activity   Assist Walk 50 feet with 2 turns activity did not occur: Safety/medical concerns         Walk 150 feet activity   Assist Walk 150 feet activity did not occur: Safety/medical concerns         Walk 10 feet on uneven surface  activity   Assist     Assist level: Minimal Assistance - Patient > 75% Assistive device: Other (comment) (2 handrails)   Wheelchair     Assist Is the patient using a wheelchair?: Yes Type of Wheelchair: Manual    Wheelchair assist level: Dependent - Patient 0% Max wheelchair distance: 150'    Wheelchair 50 feet with 2 turns activity    Assist        Assist Level: Dependent - Patient 0%   Wheelchair 150 feet activity     Assist          Blood pressure (!) 147/76, pulse (!) 58, temperature 98.5 F (36.9 C), temperature source Oral, resp. rate 17, height 5\' 3"  (1.6 m), weight 55.9 kg, SpO2 99%.  Medical Problem List and Plan: 1. Functional deficits secondary to TBI and polytrauma due to a fall with associated Croop contrecoup brain injury, right frontal lobe hemorrhagic contusion and right frontal ischemic nonhemorrhagic infarct.             -patient may shower             -ELOS/Goals: 7-10 days, PT/OT/SLP mod I to supervision             -Continue CIR therapies including PT, OT, and SLP. Interdisciplinary team conference today to discuss goals, barriers to  discharge, and dc planning.    -vestibular reassessment and treat today 2.  Antithrombotics: -DVT/anticoagulation:  Mechanical: Sequential compression devices, below knee Bilateral lower extremities             -antiplatelet therapy: ASA 3. Pain Management: Tylenol prn.  4. Mood/Behavior/Sleep: LCSW to follow for evaluation and support.              -antipsychotic agents: N/A             --chronic insomnia. Naps on and  off throughout the day. 5. Neuropsych/cognition: This patient is not fully capable of making decisions on her own behalf. 6. Skin/Wound Care: Routine pressure relief measures.  7. Fluids/Electrolytes/Nutrition: Strict I/O. Change to renal diet. Continue 1500 cc FR. -hypokalemia--will defer to renal team for replacement  8. A fib w/RVR/Acute systolic CHF: Fluid overload due to A fib and managed with HD.              --Duo nebs prn added   for SOB? 1500 cc FR/monitor weights daily.              --monitor HR TID--on amiodarone taper. Continue metoprolol Filed Weights   10/30/22 2112 10/31/22 0602  Weight: 55.4 kg 55.9 kg     9. HTN: Monitor BP TID--on metoprolol bid.  Monitor with mobility  9/17 controlled 10. ESRD: HD MWF at the end of the day to help with tolerance of therapy             --hx of intradialysis hypotension (midodrine in the past)   -AVF ok per vascular surgery 11. Pancreatic insufficiency s/p Whipple: Continue Creon with meals  BID ac             --has dumping syndrome (abdominal pain with meals followed by diarrhea at times) 12. Left pubic rami/Sacral Ala at S4: WBAT LLE per Dr. Thad Ranger. Follow up Xrays in 2 weeks. 13. L-renal mass (likely RCC) urine leak s/p chronic nephrostomy tube: Tube exchanged out on 09/03 by IR per protocol             --not on AC due to bleeding from nephrostomy tube/kidney.  Measure output daily.   14. T2DM diet controlled w/peripheral neuropathy: Monitor BS ac/hs and use SSI for elevated BS.  --Hgb A1C- 6.1. will add CM  restrictions.   15. Likely BPPV--  some improvement comparatively to my exam last Thursday 9/17 -vestibular reassessment today               --an orthostatic component also      -  meclizine  scheduled for now, 25mg  TID    16. Anemia of CKD: Monitor with serial checks. H/H stable in 10 range. Add iron panel to HD labs  17. H/o Shingles w/optic nerve involvement: Continue Valtrex.  18. Restless leg syndrome: Continue Requip 0.5 mg HS with additional 0.25 mg prn during the day. Discussed that this medication could contribute to dizziness but patient feels it is unlikely because she has been taking it for a long time.   19. Bradycardia: resolved, continue to monitor HR TID  20. Orthostasis: continue teds with therapy only   -resting bp's ok today  22. L forearm swelling and redness- could be cellulitis but appears to be more compression/bruising of arm. Pt says it's happening in HD. She also states she tends to have swelling.   -appreciate vascular surgery assessment  -elevate LUE while in bed    LOS: 6 days A FACE TO FACE EVALUATION WAS PERFORMED  Ranelle Oyster 11/05/2022, 9:23 AM

## 2022-11-06 DIAGNOSIS — S32402D Unspecified fracture of left acetabulum, subsequent encounter for fracture with routine healing: Secondary | ICD-10-CM | POA: Diagnosis not present

## 2022-11-06 DIAGNOSIS — N186 End stage renal disease: Secondary | ICD-10-CM | POA: Diagnosis not present

## 2022-11-06 DIAGNOSIS — I4891 Unspecified atrial fibrillation: Secondary | ICD-10-CM | POA: Diagnosis not present

## 2022-11-06 DIAGNOSIS — S069X1D Unspecified intracranial injury with loss of consciousness of 30 minutes or less, subsequent encounter: Secondary | ICD-10-CM | POA: Diagnosis not present

## 2022-11-06 LAB — GLUCOSE, CAPILLARY
Glucose-Capillary: 106 mg/dL — ABNORMAL HIGH (ref 70–99)
Glucose-Capillary: 119 mg/dL — ABNORMAL HIGH (ref 70–99)
Glucose-Capillary: 107 mg/dL — ABNORMAL HIGH (ref 70–99)
Glucose-Capillary: 135 mg/dL — ABNORMAL HIGH (ref 70–99)

## 2022-11-06 MED ORDER — ALTEPLASE 2 MG IJ SOLR: 2.0000 mg | Freq: Once | INTRAMUSCULAR | Status: DC | PRN

## 2022-11-06 MED ORDER — AMANTADINE HCL 100 MG PO CAPS
100.0000 mg | ORAL_CAPSULE | ORAL | Status: DC
  Administered 2022-11-06 – 2022-11-13 (×2): 100 mg via ORAL
  Filled 2022-11-06 (×3): qty 1

## 2022-11-06 MED ORDER — LIDOCAINE-PRILOCAINE 2.5-2.5 % EX CREA: 1.0000 | TOPICAL_CREAM | CUTANEOUS | Status: DC | PRN

## 2022-11-06 MED ORDER — PENTAFLUOROPROP-TETRAFLUOROETH EX AERO: 1.0000 | INHALATION_SPRAY | CUTANEOUS | Status: DC | PRN

## 2022-11-06 MED ORDER — LIDOCAINE HCL (PF) 1 % IJ SOLN: 5.0000 mL | INTRAMUSCULAR | Status: DC | PRN

## 2022-11-06 MED ORDER — AMANTADINE HCL 100 MG PO CAPS: 100.0000 mg | ORAL_CAPSULE | Freq: Every day | ORAL | Status: DC

## 2022-11-06 MED ORDER — ANTICOAGULANT SODIUM CITRATE 4% (200MG/5ML) IV SOLN: 5.0000 mL | Status: DC | PRN

## 2022-11-06 MED ORDER — HEPARIN SODIUM (PORCINE) 1000 UNIT/ML DIALYSIS: 1000.0000 [IU] | INTRAMUSCULAR | Status: DC | PRN

## 2022-11-06 NOTE — Consult Note (Signed)
Neuropsychological Consultation Comprehensive Inpatient Rehab   Patient:   Kim Burns   DOB:   01/31/1936  MR Number:  [id number]  Location:  [facility] [address] CENTER [address] [place] [address] Dept: [phone] Loc: [phone]           Date of Service:   11/05/2022  Start Time:   3 PM End Time:   4 PM  Provider/Observer:  Arley Phenix, Psy.D.       Clinical Neuropsychologist       Billing Code/Service: [phone]  Reason for Service:    Kim Burns is an 87 year old female referred for neuropsychological consultation due to ongoing cognitive deficits and currently admitted onto the comprehensive inpatient rehabilitation unit.  The patient has a past medical history including hypertension, renal deficits, A-fib on no anticoagulants, end-stage renal disease with hemodialysis on Monday Wednesday and Friday, visual impairment and gait disorder with previous falls.  Patient is also noted to be hard of hearing.  Patient was admitted on 10/21/2022 after being [place] by family member who would come to take her to dialysis.  Patient initially was identified to have acute coup contrecoup brain injury with nondisplaced left postural skull fracture, right anterior frontal lobe hemorrhagic contusion and left posterior scalp hematoma and nondisplaced sacral fracture at S4.  Serial CTs were stable for hemorrhagic bleeds.  There is no mass effect noted.  MRI brain noted some chronic small vessel disease mild for age and a few small remote bilateral cerebellar infarcts.  There was a 7 mm focus of mild diffusion signal abnormality involving the subcortical posterior right frontal region suspected for small acute to early subacute ischemic infarct noted  During today's visit, the patient was generally oriented but did show some ongoing cognitive difficulties with attention and focus.  Patient has both anterograde and  retrograde amnesia around events.  Patient is unsure how long she was down after a fall.  Patient was able to describe clearly her kitchen where she had fallen onto a very hard floor and possibly struck her head on a countertop going down as well.  Patient was able to describe therapeutic efforts on the unit and is making functional gains in therapy.  Patient has family to care but there is likely going to need to be more hands-on care given for at least some time with the patient as she continues on recovery.  Patient notes significant improvement in her cognition over the past couple of days.  Patient reports that while this is stressful and she has increased worry and anxiety about being able to return back to her prior function level she reports that she is coping well with the therapies themselves and feels like she is getting good attention and working and effective ways to improve her functioning status.  HPI for the current admission:    HPI:  Kim Burns is an 87 year old female with history of HTN, Renal artery embolization, Whipple (non malignant mass), A fib (no AC), ESRD-HD MWF, visual impairment and gait d/o w/falls, HOH who was admitted on 10/21/22 after being found face down on the  floor by son in law who went to pick her up for dialysis.  Work up in ED revealed acute croup contrecoup brain injury with nondisplaced left posterior skull fracture, right anterior frontal lobe hemorrhagic contusion and left posterior scalp hematoma and nondisplaced sacral fracture at S4.  Incidental findings of mildly  progressive multilevel cervical spondylolysis, mild to advanced DDD lumbar spine, severe bilateral carotid atherosclerosis and mild wall thickening of distal sigmoid colon--correlate for signs/symptoms of colitis.  Dr. Dutch Quint recommended monitoring in the ICU with serial CT head, hold ASA for a week and avoid heparin with HD.    Serial CT head stable.  MRI brain showed stable size of contrecoup  pulmonology contusion, localized edema without significant mass effect, 7 mm signal abnormality suspicious for small acute to early subacute ischemic nonhemorrhagic infarct and small bilateral subdural collections without significant change. Dr. Brock Bad felt that patient with acute ischemic stroke likely cardioembolic due to A-fib and recommended consideration for Watchman procedure as AC contraindicated. Dr. Servando Salina  consulted for input on A-fib with RVR as well as low blood pressure.  Amiodarone added and metoprolol held briefly then resumed at lower dose as BP stabilized.      Patient with left hip pain due to possible left inferior pubic rami fracture and sacral fracture.  Dr. Deberah Castle recommended weightbearing as tolerated as patient without numbness or tingling in LLE and to follow-up on outpatient basis with X rays in 3 weeks.    Medical History:   Past Medical History:  Diagnosis Date   Anemia of chronic disease    takes iron   Anxiety    Blood transfusion without reported diagnosis    Bradycardia    Diabetes mellitus without complication (HCC)    became diabetic after Whipple procedure   Diarrhea    Dysrhythmia 04/16/2016   bradycardia due to medication    ESRD on hemodialysis [facility])    M-W-F   GERD (gastroesophageal reflux disease)    Gout    Headache    History of kidney stones 06/2013   Hyperparathyroidism (HCC)    Hypertension    PONV (postoperative nausea and vomiting)    Sleep apnea    no cpap machine. could not tolerate   Stroke (HCC) 04/16/2016   TIA 1995   Vitamin D deficiency          Patient Active Problem List   Diagnosis Date Noted   TBI (traumatic brain injury) (HCC) 10/31/2022   Lacunar infarct, acute (HCC) 10/27/2022   Acute systolic heart failure (HCC) 10/26/2022   Atrial fibrillation, rapid (HCC) 10/26/2022   Pressure injury of skin 10/26/2022   Traumatic brain injury (HCC) 10/21/2022   Contusion of right cerebral hemisphere (HCC) 10/21/2022    Closed skull fracture (HCC) 10/21/2022   Sacral fracture, closed (HCC) 10/21/2022   Exocrine pancreatic insufficiency 10/21/2022   Hypertension 10/21/2022   Hypomagnesemia 09/18/2022   RLS (restless legs syndrome) 09/18/2022   HSV (herpes simplex virus) dendritic keratitis 09/18/2022   Transaminitis 09/18/2022   Atrial fibrillation with RVR (HCC) 09/14/2022   Essential hypertension 09/14/2022   Erythropoietin (EPO) stimulating agent anemia management patient 07/16/2022   Hematoma of left kidney 01/22/2022   Kidney, perinephric abscess 11/02/2021   Bacteremia due to Streptococcus 11/01/2021   Lactic acidosis 10/30/2021   Fever 10/30/2021   Severe sepsis (HCC) 10/29/2021   Hydronephrosis of left kidney 10/29/2021   Chronic diastolic CHF (congestive heart failure) (HCC) 10/29/2021   Coronary artery disease involving native coronary artery of native heart without angina pectoris 10/29/2021   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) 10/29/2021   Chest pain of uncertain etiology    Pulmonary nodule 08/26/2021   Paroxysmal atrial fibrillation (HCC) 08/26/2021   End-stage renal disease on hemodialysis (HCC) 08/25/2021   Renal cell carcinoma of left kidney (  HCC) 08/22/2021   DM2 (diabetes mellitus, type 2) (HCC) 11/14/2016   Hypokalemia 11/12/2016   Melena    History of anemia due to chronic kidney disease    Acute renal failure (ARF) (HCC) 10/03/2016   GERD (gastroesophageal reflux disease) 04/29/2016   Hyperglycemia 04/29/2016   IPMN (intraductal papillary mucinous neoplasm) 04/23/2016   History of stroke 04/16/2016   Sepsis (HCC) 06/18/2013   Localized swelling, mass and lump, neck 03/29/2013   Sinus bradycardia 09/18/2012   Fatigue 09/18/2012    Behavioral Observation/Mental Status:   Kim Burns  presents as a 87 y.o.-year-old Right handed Caucasian Female who appeared her stated age. her dress was Appropriate and she was Well Groomed and her manners were  Appropriate to the situation.  her participation was indicative of Redirectable behaviors.  There were physical disabilities noted.  she displayed an appropriate level of cooperation and motivation.    Interactions:    Active Inattentive  Attention:   abnormal and attention span appeared shorter than expected for age  Memory:   abnormal; remote memory intact, recent memory impaired  Visuo-spatial:   not examined  Speech (Volume):  normal  Speech:   normal; normal  Thought Process:  Coherent and Relevant  Circumstantial  Though Content:  WNL; not suicidal and not homicidal  Orientation:   person, place, and situation  Judgment:   Fair  Planning:   Fair  Affect:    Appropriate  Mood:    Dysphoric  Insight:   Good  Intelligence:   normal  Family Med/Psych History:  Family History  Problem Relation Age of Onset   Heart disease Mother    Stroke Mother    Cancer Father        colon   Alzheimer's disease Sister    Alzheimer's disease Sister    Cancer Brother        brain   Breast cancer Neg Hx     Impression/DX:   Kim Burns is an 87 year old female referred for neuropsychological consultation due to ongoing cognitive deficits and currently admitted onto the comprehensive inpatient rehabilitation unit.  The patient has a past medical history including hypertension, renal deficits, A-fib on no anticoagulants, end-stage renal disease with hemodialysis on Monday Wednesday and Friday, visual impairment and gait disorder with previous falls.  Patient is also noted to be hard of hearing.  Patient was admitted on 10/21/2022 after [place] family member who would come to take her to dialysis.  Patient initially was identified to have acute coup contrecoup brain injury with nondisplaced left postural skull fracture, right anterior frontal lobe hemorrhagic contusion and left posterior scalp hematoma and nondisplaced sacral fracture at S4.  Serial CTs were stable  for hemorrhagic bleeds.  There is no mass effect noted.  MRI brain noted some chronic small vessel disease mild for age and a few small remote bilateral cerebellar infarcts.  There was a 7 mm focus of mild diffusion signal abnormality involving the subcortical posterior right frontal region suspected for small acute to early subacute ischemic infarct noted  During today's visit, the patient was generally oriented but did show some ongoing cognitive difficulties with attention and focus.  Patient has both anterograde and retrograde amnesia around events.  Patient is unsure how long she was down after a fall.  Patient was able to describe clearly her kitchen where she had fallen onto a very hard floor and possibly struck her head on a countertop going down as well.  Patient was able to describe therapeutic efforts on the unit and is making functional gains in therapy.  Patient has family to care but there is likely going to need to be more hands-on care given for at least some time with the patient as she continues on recovery.  Patient notes significant improvement in her cognition over the past couple of days.  Patient reports that while this is stressful and she has increased worry and anxiety about being able to return back to her prior function level she reports that she is coping well with the therapies themselves and feels like she is getting good attention and working and effective ways to improve her functioning status.  Disposition/Plan:  Today we worked on coping and adjustment issues with recent TBI after mechanical fall of unknown reason for fall although the patient has had increasing issues with gait and was using a walker typically at home and a wheelchair when away from home prior to this fall with TBI.         Electronically Signed   _______________________ Arley Phenix, Psy.D. Clinical Neuropsychologist

## 2022-11-06 NOTE — Progress Notes (Signed)
   11/06/22 1729  Vitals  Temp (!) 97.5 F (36.4 C)  Pulse Rate (!) 48  Resp 10  BP 111/61  SpO2 100 %  O2 Device Room Air  Weight 54.5 kg  Type of Weight Post-Dialysis  Oxygen Therapy  Patient Activity (if Appropriate) In bed  Pulse Oximetry Type Continuous  Oximetry Probe Site Changed No  Post Treatment  Dialyzer Clearance Lightly streaked  Hemodialysis Intake (mL) 0 mL  Liters Processed 84  Fluid Removed (mL) 1400 mL  Tolerated HD Treatment Yes  AVG/AVF Arterial Site Held (minutes) 10 minutes  AVG/AVF Venous Site Held (minutes) 10 minutes   Received patient in bed to unit.  Alert and oriented.  Informed consent signed and in chart.   TX duration:3.5  Patient tolerated well.  Transported back to the room  Alert, without acute distress.  Hand-off given to patient's nurse.   Access used: LUAF Access issues: no complications  Total UF removed: 1400 Medication(s) given: hectoral iv x 1 [facility]

## 2022-11-06 NOTE — Progress Notes (Signed)
Speech Language Pathology TBI Note  Patient Details  Name: Kim Burns MRN: [id number] Date of Birth: 03-14-1935  Today's Date: 11/06/2022 SLP Individual Time: 0725-0810 SLP Individual Time Calculation (min): 45 min  Short Term Goals: Week 1: SLP Short Term Goal 1 (Week 1): Patient will utilize memory compensatory strategies to recall daily, functional information with Min verbal cues. SLP Short Term Goal 2 (Week 1): Patient will utilize external aids for recall of date with Mod I. SLP Short Term Goal 3 (Week 1): Patient will demonstrate functional problem solving for basic and familiar tasks with Min verbal cues.  Skilled Therapeutic Interventions: Skilled treatment session focused on cognitive goals. Upon arrival, patient was asleep in bed but easily awakened. Patient requested another soda but SLP provided patient with water per nursing request. Patient became verbally frustrated and reported she doesn't drink water. SLP provided education and a crystal light packet to assist in altering flavor but patient continued to decline.  SLP facilitated session by providing overall Min verbal cues for functional problem solving during a mildly complex task of organizing 4X/day pill box. Overall, patient demonstrated increased fatigue and frustration today which appeared to impact her overall cognitive functioning. Patient left upright in bed with alarm on and all needs within reach. Continue with current plan of care.      Pain No/Denies Pain   Agitated Behavior Scale: TBI Observation Details Observation Environment: CIR Start of observation period - Date: 11/06/22 Start of observation period - Time: 0725 End of observation period - Date: 11/06/22 End of observation period - Time: 0810 Agitated Behavior Scale (DO NOT LEAVE BLANKS) Short attention span, easy distractibility, inability to concentrate: Present to a slight degree Impulsive, impatient, low tolerance for pain or frustration:  Absent Uncooperative, resistant to care, demanding: Present to a slight degree Violent and/or threatening violence toward people or property: Absent Explosive and/or unpredictable anger: Absent Rocking, rubbing, moaning, or other self-stimulating behavior: Absent Pulling at tubes, restraints, etc.: Absent Wandering from treatment areas: Absent Restlessness, pacing, excessive movement: Absent Repetitive behaviors, motor, and/or verbal: Absent Rapid, loud, or excessive talking: Absent Sudden changes of mood: Present to a slight degree Easily initiated or excessive crying and/or laughter: Absent Self-abusiveness, physical and/or verbal: Absent Agitated behavior scale total score: 17  Therapy/Group: Individual Therapy  Blonnie Maske 11/06/2022, 11:57 AM

## 2022-11-06 NOTE — Progress Notes (Addendum)
PROGRESS NOTE   Subjective/Complaints: Pt in bed resting. Feels a bit tired this morning. Still c/o dizziness  ROS: Patient denies fever, rash, sore throat, blurred vision,  nausea, vomiting, diarrhea, cough, shortness of breath or chest pain, joint or back/neck pain, headache, or mood change.     Objective:   No results found. Recent Labs    11/04/22 1159  WBC 10.2  HGB 11.3*  HCT 34.9*  PLT 264   Recent Labs    11/04/22 1159  NA 130*  K 4.2  CL 95*  CO2 24  GLUCOSE 154*  BUN 48*  CREATININE 4.08*  CALCIUM 8.8*    Intake/Output Summary (Last 24 hours) at 11/06/2022 1010 Last data filed at 11/06/2022 0734 Gross per 24 hour  Intake 708 ml  Output --  Net 708 ml     Pressure Injury 10/21/22 Buttocks Right Deep Tissue Pressure Injury - Purple or maroon localized area of discolored intact skin or blood-filled blister due to damage of underlying soft tissue from pressure and/or shear. 2 areas of deep tissue pressure in (Active)  10/21/22 1145  Location: Buttocks  Location Orientation: Right  Staging: Deep Tissue Pressure Injury - Purple or maroon localized area of discolored intact skin or blood-filled blister due to damage of underlying soft tissue from pressure and/or shear.  Wound Description (Comments): 2 areas of deep tissue pressure injuries  Present on Admission: Yes    Physical Exam: Vital Signs Blood pressure (!) 156/66, pulse (!) 56, temperature 98.1 F (36.7 C), temperature source Oral, resp. rate 17, height 5\' 3"  (1.6 m), weight 55.9 kg, SpO2 100%.   Constitutional: No distress . Vital signs reviewed. HEENT: NCAT, EOMI, oral membranes moist Neck: supple Cardiovascular: RRR without murmur. No JVD    Respiratory/Chest: CTA Bilaterally without wheezes or rales. Normal effort    GI/Abdomen: BS +, non-tender, non-distended Ext: no clubbing, cyanosis, or edema Psych: pleasant and cooperative  Skin:  Left arm swelling, crepey skin with redness/bruising below AVF. Area sl warm. Right buttock blister. Nephrostomy tube/drain in place.  Neuro:  more fatigued but fairly oriented.  Fair to functional Memory. Normal language and speech. Cranial nerve exam unremarkable. MMT: RUE 4/5, LUE 4/5. RLE 3/5 prox to 4/5 distally. LLE 1/5 hip to 4/5 ADF/PF. Sensory exam normal for light touch and pain in all 4 limbs. No limb ataxia. Did not see nystagmus but I didn't turn her head either  Musculoskeletal: Left hip tender with palpation and attempts at PROM. Left chest wall remains sore too    Assessment/Plan: 1. Functional deficits which require 3+ hours per day of interdisciplinary therapy in a comprehensive inpatient rehab setting. Physiatrist is providing close team supervision and 24 hour management of active medical problems listed below. Physiatrist and rehab team continue to assess barriers to discharge/monitor patient progress toward functional and medical goals  Care Tool:  Bathing    Body parts bathed by patient: Right arm, Chest, Left arm, Abdomen, Front perineal area, Buttocks, Face   Body parts bathed by helper: Right upper leg, Left upper leg, Right lower leg, Left lower leg     Bathing assist Assist Level: Maximal Assistance - Patient 24 -  49%     Upper Body Dressing/Undressing Upper body dressing   What is the patient wearing?: Pull over shirt    Upper body assist Assist Level: Minimal Assistance - Patient > 75%    Lower Body Dressing/Undressing Lower body dressing      What is the patient wearing?: Pants     Lower body assist Assist for lower body dressing: Maximal Assistance - Patient 25 - 49%     Toileting Toileting Toileting Activity did not occur (Clothing management and hygiene only): N/A (no void or bm)  Toileting assist Assist for toileting: Total Assistance - Patient < 25%     Transfers Chair/bed transfer  Transfers assist  Chair/bed transfer activity did  not occur: Safety/medical concerns (due to dizziness)  Chair/bed transfer assist level: Moderate Assistance - Patient 50 - 74%     Locomotion Ambulation   Ambulation assist      Assist level: Minimal Assistance - Patient > 75% Assistive device: Walker-rolling Max distance: 45'   Walk 10 feet activity   Assist     Assist level: Minimal Assistance - Patient > 75% Assistive device: Walker-rolling   Walk 50 feet activity   Assist Walk 50 feet with 2 turns activity did not occur: Safety/medical concerns         Walk 150 feet activity   Assist Walk 150 feet activity did not occur: Safety/medical concerns         Walk 10 feet on uneven surface  activity   Assist     Assist level: Minimal Assistance - Patient > 75% Assistive device: Other (comment) (2 handrails)   Wheelchair     Assist Is the patient using a wheelchair?: Yes Type of Wheelchair: Manual    Wheelchair assist level: Dependent - Patient 0% Max wheelchair distance: 150'    Wheelchair 50 feet with 2 turns activity    Assist        Assist Level: Dependent - Patient 0%   Wheelchair 150 feet activity     Assist          Blood pressure (!) 156/66, pulse (!) 56, temperature 98.1 F (36.7 C), temperature source Oral, resp. rate 17, height 5\' 3"  (1.6 m), weight 55.9 kg, SpO2 100%.  Medical Problem List and Plan: 1. Functional deficits secondary to TBI and polytrauma due to a fall with associated Croop contrecoup brain injury, right frontal lobe hemorrhagic contusion and right frontal ischemic nonhemorrhagic infarct.             -patient may shower             -ELOS/Goals: 7-10 days, PT/OT/SLP mod I to supervision             -Continue CIR therapies including PT, OT, and SLP. Interdisciplinary team conference today to discuss goals, barriers to discharge, and dc planning.    -vestibular reassessment and treat performed. She had difficulty fully tolerating due to symptoms. Will  continue to address 2.  Antithrombotics: -DVT/anticoagulation:  Mechanical: Sequential compression devices, below knee Bilateral lower extremities             -antiplatelet therapy: ASA 3. Pain Management: Tylenol prn.  4. Mood/Behavior/Sleep: LCSW to follow for evaluation and support.              -antipsychotic agents: N/A             --chronic insomnia. Napping during day creates a problem   9/18-might benefit from daytime stimulant          -  begin trial of amantadine 100mg  daily 5. Neuropsych/cognition: This patient is not fully capable of making decisions on her own behalf. 6. Skin/Wound Care: Routine pressure relief measures.  7. Fluids/Electrolytes/Nutrition: Strict I/O. Change to renal diet. Continue 1500 cc FR. -hypokalemia--per renal team for replacement  8. A fib w/RVR/Acute systolic CHF: Fluid overload due to A fib and managed with HD.              --Duo nebs prn added   for SOB? 1500 cc FR/monitor weights daily.              --monitor HR TID--on amiodarone taper. Continue metoprolol Filed Weights   10/30/22 2112 10/31/22 0602  Weight: 55.4 kg 55.9 kg     9. HTN: Monitor BP TID--on metoprolol bid.  Monitor with mobility  9/18 controlled 10. ESRD: HD MWF at the end of the day to help with tolerance of therapy             --hx of intradialysis hypotension (midodrine in the past)   -AVF ok per vascular surgery 11. Pancreatic insufficiency s/p Whipple: Continue Creon with meals  BID ac             --has dumping syndrome (abdominal pain with meals followed by diarrhea at times) 12. Left pubic rami/Sacral Ala at S4: WBAT LLE per Dr. Thad Ranger. Follow up Xrays in 2 weeks. 13. L-renal mass (likely RCC) urine leak s/p chronic nephrostomy tube: Tube exchanged out on 09/03 by IR per protocol             --not on AC due to bleeding from nephrostomy tube/kidney.  Measure output daily.   14. T2DM diet controlled w/peripheral neuropathy: Monitor BS ac/hs and use SSI for elevated BS.   --Hgb A1C- 6.1. will add CM restrictions.   15. Likely BPPV--  continues to be problematic     -continue to assess and treat as tolerated               --an orthostatic component also      -  meclizine  scheduled for now, 25mg  TID    16. Anemia of CKD: Monitor with serial checks. H/H stable in 10 range. Add iron panel to HD labs  17. H/o Shingles w/optic nerve involvement: Continue Valtrex.  18. Restless leg syndrome: Continue Requip 0.5 mg HS with additional 0.25 mg prn during the day.     19. Bradycardia: resolved, continue to monitor HR TID  20. Orthostasis: continue teds with therapy only   -resting bp's ok today  22. L forearm swelling and redness-  appears to be more compression/bruising of arm. Pt says it's happening in HD. She also states she tends to have swelling at baseline.   -appreciate vascular surgery assessment  -elevate LUE while in bed    LOS: 7 days A FACE TO FACE EVALUATION WAS PERFORMED  Ranelle Oyster 11/06/2022, 10:10 AM

## 2022-11-06 NOTE — Progress Notes (Signed)
Occupational Therapy Weekly Progress Note  Patient Details  Name: Kim Burns MRN: [id number] Date of Birth: [birth year]  Beginning of progress report period: October 31, 2022 End of progress report period: November 06, 2022  Today's Date: 11/06/2022 OT Individual Time: 4098-1191 OT Individual Time Calculation (min): 73 min    Patient has met 3 of 3 short term goals. Pt needs to be able to complete stairs to access home and discharge dispo is still uncertain for home vs son's home due to pt's limited activity tolerance and weakness. OT continues to progress LB self care with training with AE including issuing a LH sponge for bathing and trng with reacher and LH shoe horn for clothing mngt. Pt's L LE weakness limits LB self care but SPT and RW short distance amb improving slowly. Pt will benefit from continued skilled OT services to promote safety and increased function prior to d/c home.   Patient continues to demonstrate the following deficits: muscle weakness, muscle joint tightness, and muscle paralysis, decreased cardiorespiratoy endurance, impaired timing and sequencing, unbalanced muscle activation, ataxia, decreased coordination, and decreased motor planning, central origin, and decreased sitting balance, decreased standing balance, decreased postural control, hemiplegia, and decreased balance strategies and therefore will continue to benefit from skilled OT intervention to enhance overall performance with BADL, iADL, and Reduce care partner burden.  Patient progressing toward long term goals..  Continue plan of care.  OT Short Term Goals Week 1:  OT Short Term Goal 1 (Week 1): Pt will tolerate sitting EOB for 5 mins in preparation for BADL tsaks OT Short Term Goal 1 - Progress (Week 1): Met OT Short Term Goal 2 (Week 1): Patient will complete toilet transfer with mod A of 1 OT Short Term Goal 2 - Progress (Week 1): Met OT Short Term Goal 3 (Week 1): Patient will complete 1 step  of LB dressing task using LRAD OT Short Term Goal 3 - Progress (Week 1): Met Week 2:  OT Short Term Goal 1 (Week 2): STG=LTG's d/t LOS  Skilled Therapeutic Interventions/Progress Updates:   Pt seen for skilled OT session with weekly reassessment and care coordination for safe d/c planning. Pt continues to report dizziness and fatigue this session with thigh high TEDs in place and pt resting in bed following PT session. Pt to have HD midday but agreeable to all OT activity presented. Pt requested toileting and used RW to access commode in room. Moved supine to sit with increased time and effort but S only. Slip on shoes to donn and doff with reacher and LH shoe horn with min A. 15 ft amb with RW with sit to stand with min A initially but then CGA to and from bed. Min A for pants mngt and incontinence brief change as well as buttocks hygiene, set up for peri hygiene. Stood for oral care for 2 min x 3 intervals with brief seated rests and CGA. Required mod A to move L LE back into bed but CGA overall. Once resting back in bed, pt performed 3 sets of 10 reps triceps press with yellow tband with rest breaks between sets, demo and cues for breathing integration. Left pt with 4 P's reinforced, needs and safety measures in place.   Pain: denies pain only fatigue this session  Therapy Documentation Precautions:  Precautions Precautions: Fall, Other (comment) Precaution Comments: L lower abdominal drain, vertigo, orthostatic hypotension Restrictions Weight Bearing Restrictions: Yes LLE Weight Bearing: Weight bearing as tolerated  Therapy/Group: Individual Therapy  Alcario Drought  Anise Salvo 11/06/2022, 10:55 AM

## 2022-11-06 NOTE — Progress Notes (Signed)
[facility] Progress Note   Subjective:  Seen in room. Eating lunch, wants regular diet. No new complaints. Dialysis today   Objective Vitals:   11/05/22 0630 11/05/22 1404 11/05/22 1926 11/06/22 0635  BP: (!) 147/76 (!) 154/61 (!) 151/68 (!) 156/66  Pulse: (!) 58 (!) 54 (!) 58 (!) 56  Resp: 17 17 18 17   Temp: 98.5 F (36.9 C) 97.9 F (36.6 C) 98.1 F (36.7 C) 98.1 F (36.7 C)  TempSrc: Oral Oral  Oral  SpO2: 99% 100% 100% 100%  Weight:      Height:       Physical Exam General: Elderly woman, NAD. Room air Heart: RRR; no murmur Lungs: CTAB, no rales Abdomen: soft Extremities: 1+ BLE edema; 2+ L elbow region edema Dialysis Access: LUE AVF + bruit  Additional Objective Labs: Basic Metabolic Panel: Recent Labs  Lab 10/30/22 2303 10/31/22 0019 10/31/22 0804 11/01/22 1330 11/04/22 1159  NA 133*   < > 132* 133* 130*  K 3.2*   < > 3.3* 3.5 4.2  CL 94*   < > 95* 94* 95*  CO2 28   < > 26 26 24   GLUCOSE 119*   < > 101* 81 154*  BUN 16   < > 21 38* 48*  CREATININE 2.53*   < > 2.85* 3.58* 4.08*  CALCIUM 8.1*   < > 8.3* 8.2* 8.8*  PHOS 3.2  --   --  3.8 4.1   < > = values in this interval not displayed.   Liver Function Tests: Recent Labs  Lab 10/31/22 0804 11/01/22 1330 11/04/22 1159  AST 21  --   --   ALT 32  --   --   ALKPHOS 71  --   --   BILITOT 0.6  --   --   PROT 4.5*  --   --   ALBUMIN 2.0* 2.0* 2.1*   CBC: Recent Labs  Lab 10/30/22 2303 10/31/22 0804 11/01/22 1330 11/04/22 1159  WBC 7.6 8.6 7.7 10.2  NEUTROABS  --  6.8  --   --   HGB 11.7* 11.2* 10.8* 11.3*  HCT 35.1* 34.2* 33.4* 34.9*  MCV 96.4 94.5 97.1 95.4  PLT 248 265 241 264   Medications:  anticoagulant sodium citrate      (feeding supplement) PROSource Plus  30 mL Oral BID BM   acetaminophen  500 mg Oral TID   amantadine  100 mg Oral Weekly   amiodarone  200 mg Oral Daily   aspirin  81 mg Oral Daily   Chlorhexidine Gluconate Cloth  6 each Topical Q0600    doxercalciferol  6 mcg Intravenous Q M,W,F-HD   insulin aspart  0-5 Units Subcutaneous QHS   insulin aspart  0-6 Units Subcutaneous TID WC   lidocaine  1 patch Transdermal Q24H   lipase/protease/amylase  36,000 Units Oral BID WC   meclizine  25 mg Oral TID   metoprolol succinate  25 mg Oral Daily   rOPINIRole  0.5 mg Oral QHS   rOPINIRole  0.5 mg Oral QHS   sevelamer carbonate  800 mg Oral TID WC   valACYclovir  500 mg Oral QHS   Dialysis Orders: MWF at [facility] 3.5hr, 350/A1.5, EDW 55kg, 2K/2C bath, L AVF, no heparin - Mircera was on hold at time of admit - Hectoral IV q HD   Assessment/Plan: Head injury/fall from bed -> R frontal hemorrhagic contusion, R frontal ischemic CVA, small B SDHs, L posterior scalp hematoma,  non-displaced L parietal-occipital bone fracture: Now in CIR ESRD: Continue HD on usual MWF schedule -> next HD 9/18. HTN/volume: BP fine but ongoing L arm and BLE edema on exam - will plan for ^ UFG with next HD. Dialysis access: Worsening LUE edema raising concern for stenosis.  Less concerning for infection. Appreciate VVS evaluation - no need for intervention at this time.  Anemia of ESRD: Hgb 10.8, no ESA at this time. Secondary hyperparathyroidism:  CorrCa/Phos to goal - continue Renvela + VDRA. Nutrition: Alb low, continue supplements.  Tomasa Blase PA-C [facility] 11/06/2022,12:45 PM

## 2022-11-06 NOTE — Progress Notes (Signed)
Physical Therapy Session Note  Patient Details  Name: Kim Burns MRN: [id number] Date of Birth: 1935/07/19  Today's Date: 11/06/2022 PT Individual Time: 0850-0959 PT Individual Time Calculation (min): 69 min   Short Term Goals: Week 1:  PT Short Term Goal 1 (Week 1): Pt will perform bed mobility with CGA. PT Short Term Goal 2 (Week 1): Pt will perform sit to stand with CGA. PT Short Term Goal 3 (Week 1): PT will perform bed to chair with CGA consistently. PT Short Term Goal 4 (Week 1): Pt will ambulate x50' with CGA and LRAD.  Skilled Therapeutic Interventions/Progress Updates:     Pt received supine in bed and agrees to therapy. Pain reported in Lt hip. Says that is is not quite as bad as it has been. PT provides totalA to don knee high Ted hose. Pt performs supine to sit with cues for body mechanics and use of bed features. Pt given extended time at EOB to acclimate to upright positioning with report of "vertigo" upon sitting. PT assists to don shoes while seated at EOB. Pt performs sit to stand with RW and minA, with cues for anterior weight shifting. Pt reports dizziness upon standing and PT provides cues to allow dizziness to pass prior to ambulating away from bed. Pt ambulates x100' from room to dayroom with RW and CGA, with cues for posture and safe AD management. Pt completes Kinetron activity for strengthening of posterior chain muscles and endurance training. Pt completes 2x5:00 at resistance of 30 cm/sec with cues for body mechanics and correct performance. Pt stands and ambulates x125' very slowly and verbalizes fatigue and malaise, requesting to stop and rest. PT provides chair for seated rest break and assesses pt's BP at 129/64, with HR of 49 bpm. RN notified of pt's headache and bradycardia. Pt given extended seated rest break and states that she feels unable to ambulate back to room. WC brought and pt performs stand step transfer to [facility] with minA, then stand step to bed without  AD and with minA/modA. ModA for sit to supine with manual management of BLEs. Left supine with all needs iwthin reach.   Therapy Documentation Precautions:  Precautions Precautions: Fall, Other (comment) Precaution Comments: L lower abdominal drain, vertigo, orthostatic hypotension Restrictions Weight Bearing Restrictions: Yes LLE Weight Bearing: Weight bearing as tolerated   Therapy/Group: Individual Therapy  Beau Fanny, PT, DPT 11/06/2022, 4:12 PM

## 2022-11-06 NOTE — Plan of Care (Signed)
  Problem: Consults Goal: RH STROKE PATIENT EDUCATION Description: See Patient Education module for education specifics  Outcome: Progressing   Problem: RH BOWEL ELIMINATION Goal: RH STG MANAGE BOWEL WITH ASSISTANCE Description: STG Manage Bowel with toileting Assistance. Outcome: Progressing Goal: RH STG MANAGE BOWEL W/MEDICATION W/ASSISTANCE Description: STG Manage Bowel with Medication with mod I Assistance. Outcome: Progressing   Problem: RH SKIN INTEGRITY Goal: RH STG SKIN FREE OF INFECTION/BREAKDOWN Description: Manage skin with min assist Outcome: Progressing Goal: RH STG MAINTAIN SKIN INTEGRITY WITH ASSISTANCE Description: STG Maintain Skin Integrity With min Assistance. Outcome: Progressing   Problem: RH SAFETY Goal: RH STG ADHERE TO SAFETY PRECAUTIONS W/ASSISTANCE/DEVICE Description: STG Adhere to Safety Precautions With cues Assistance/Device. Outcome: Progressing   Problem: RH KNOWLEDGE DEFICIT Goal: RH STG INCREASE KNOWLEDGE OF HYPERTENSION Description: Patient and family will be able to manage HTN/Syncope with medication and dietary modification using educational resources independently Outcome: Progressing Goal: RH STG INCREASE KNOWLEDGE OF STROKE PROPHYLAXIS Description: Patient and family will be able to manage secondary risks with medication and dietary modification using educational resources independently Outcome: Progressing   Problem: Education: Goal: Knowledge of General Education information will improve Description: Including pain rating scale, medication(s)/side effects and non-pharmacologic comfort measures Outcome: Progressing   Problem: Health Behavior/Discharge Planning: Goal: Ability to manage health-related needs will improve Outcome: Progressing   Problem: Clinical Measurements: Goal: Ability to maintain clinical measurements within normal limits will improve Outcome: Progressing Goal: Will remain free from infection Outcome:  Progressing Goal: Diagnostic test results will improve Outcome: Progressing Goal: Respiratory complications will improve Outcome: Progressing Goal: Cardiovascular complication will be avoided Outcome: Progressing   Problem: Activity: Goal: Risk for activity intolerance will decrease Outcome: Progressing   Problem: Nutrition: Goal: Adequate nutrition will be maintained Outcome: Progressing   Problem: Coping: Goal: Level of anxiety will decrease Outcome: Progressing   Problem: Elimination: Goal: Will not experience complications related to bowel motility Outcome: Progressing Goal: Will not experience complications related to urinary retention Outcome: Progressing   Problem: Pain Managment: Goal: General experience of comfort will improve Outcome: Progressing

## 2022-11-06 NOTE — Progress Notes (Signed)
Patient ID: Kim Burns, female   DOB: October 01, 1935, 87 y.o.   MRN: [id number]   [id number] followed up with pt son to confirm he received message yesterday. He confirms he did. He expressed concerns about if his mother has practiced stairs at all as he knows his mother would like to go to her home. If she has, he would like to know well she has done stating he can assist her with getting up the stairs. He asked if she is able to stay longer based on the amount of stairs she has to navigate to get in/out of the home. Pt will need to walk down 13 steps to get the front deck- R hand rail, walk 25 ft and then another 3 steps to get into the home; bilateral hand rails on deck but she can only use one side. If she has to go to her son's home he will support her, however, he knows his mother would like to go to her home.  SW explained the coordination of care for [facility] and dialysis clinic in the area. SW updated Kelly[facility] and Tracy Mounce/Dialysis Coord.   Son's discharge address: [address], [place], [place] [address]   SW waiting on updates from medical team on if an extension is appropriate.    Kim Burns, MSW, LCSWA Office: [phone] Cell: [phone] Fax: [phone]

## 2022-11-07 DIAGNOSIS — S069X1D Unspecified intracranial injury with loss of consciousness of 30 minutes or less, subsequent encounter: Secondary | ICD-10-CM | POA: Diagnosis not present

## 2022-11-07 DIAGNOSIS — N186 End stage renal disease: Secondary | ICD-10-CM | POA: Diagnosis not present

## 2022-11-07 DIAGNOSIS — I4891 Unspecified atrial fibrillation: Secondary | ICD-10-CM | POA: Diagnosis not present

## 2022-11-07 DIAGNOSIS — S32402D Unspecified fracture of left acetabulum, subsequent encounter for fracture with routine healing: Secondary | ICD-10-CM | POA: Diagnosis not present

## 2022-11-07 LAB — BASIC METABOLIC PANEL
Anion gap: 9 (ref 5–15)
BUN: 24 mg/dL — ABNORMAL HIGH (ref 8–23)
CO2: 27 mmol/L (ref 22–32)
Calcium: 8.4 mg/dL — ABNORMAL LOW (ref 8.9–10.3)
Chloride: 94 mmol/L — ABNORMAL LOW (ref 98–111)
Creatinine, Ser: 2.42 mg/dL — ABNORMAL HIGH (ref 0.44–1.00)
GFR, Estimated: 19 mL/min — ABNORMAL LOW (ref >60)
Glucose, Bld: 125 mg/dL — ABNORMAL HIGH (ref 70–99)
Potassium: 3.8 mmol/L (ref 3.5–5.1)
Sodium: 130 mmol/L — ABNORMAL LOW (ref 135–145)

## 2022-11-07 LAB — GLUCOSE, CAPILLARY
Glucose-Capillary: 88 mg/dL (ref 70–99)
Glucose-Capillary: 101 mg/dL — ABNORMAL HIGH (ref 70–99)
Glucose-Capillary: 172 mg/dL — ABNORMAL HIGH (ref 70–99)
Glucose-Capillary: 183 mg/dL — ABNORMAL HIGH (ref 70–99)

## 2022-11-07 MED ORDER — MECLIZINE HCL 25 MG PO TABS: 25.0000 mg | ORAL_TABLET | Freq: Three times a day (TID) | ORAL | Status: DC | PRN

## 2022-11-07 NOTE — Progress Notes (Signed)
Physical Therapy Session Note  Patient Details  Name: Kim Burns MRN: [id number] Date of Birth: 1935-12-06  Today's Date: 11/07/2022 PT Individual Time: 1120-1200 PT Individual Time Calculation (min): 40 min   Short Term Goals: Week 1:  PT Short Term Goal 1 (Week 1): Pt will perform bed mobility with CGA. PT Short Term Goal 2 (Week 1): Pt will perform sit to stand with CGA. PT Short Term Goal 3 (Week 1): PT will perform bed to chair with CGA consistently. PT Short Term Goal 4 (Week 1): Pt will ambulate x50' with CGA and LRAD.  Skilled Therapeutic Interventions/Progress Updates: Pt presented in bed agreeable to therapy. Pt endorses significant fatigue as pt just completed OT session. Pt with no s/s OH this session but required increased time for all mobility. Pt with THTH on and BP stats remained WNL as noted below. Pt completed supine to sit with CGA and use of bed features. Pt donned shoes with close supervision. Bed was elevated and completed Sit to stand with minA and facilitation to increase anterior weight shifting. Pt completed ambulatory transfer to w/c with CGA. Transported to day room for energy conservation. In day room completed Sit to stand from w/c with modA and ambulated ~115ft with CGA and w/c follow for safety. After seated rest pt completed additional ambulation to room with minA for Sit to stand (from elevated surface) and ambulated to room. At EOB pt was able to complete bed mobility with minA and with PTA stabilizing B feet pt was able to complete lateral bridge to reposition and scoot self up to [facility]. Pt left resting in bed at end of session with bed alarm on, call bell within reach and needs met.   BP supine 159/65 (92) HR53 BP sitting EOB 156/73 (97) HR 52     Therapy Documentation Precautions:  Precautions Precautions: Fall, Other (comment) Precaution Comments: L lower abdominal drain, vertigo, orthostatic hypotension Restrictions Weight Bearing Restrictions:  Yes LLE Weight Bearing: Weight bearing as tolerated General:   Vital Signs: Therapy Vitals Temp: 97.6 F (36.4 C) Temp Source: Oral Pulse Rate: (!) 58 Resp: 17 BP: (!) 144/66 Patient Position (if appropriate): Lying Oxygen Therapy SpO2: 100 % O2 Device: Room Air   Therapy/Group: Individual Therapy  Laderrick Wilk 11/07/2022, 4:24 PM

## 2022-11-07 NOTE — Progress Notes (Signed)
Speech Language Pathology TBI Note  Patient Details  Name: Kim Burns MRN: [id number] Date of Birth: Nov 10, 1935  Today's Date: 11/07/2022 SLP Individual Time: 4696-2952 SLP Individual Time Calculation (min): 50 min  Short Term Goals: Week 1: SLP Short Term Goal 1 (Week 1): Patient will utilize memory compensatory strategies to recall daily, functional information with Min verbal cues. SLP Short Term Goal 2 (Week 1): Patient will utilize external aids for recall of date with Mod I. SLP Short Term Goal 3 (Week 1): Patient will demonstrate functional problem solving for basic and familiar tasks with Min verbal cues.  Skilled Therapeutic Interventions: Skilled treatment session focused on cognitive goals. Upon arrival, patient was asleep in bed but easily awakened with reports of increased fatigue today. SLP re-administered the [facility] Mental Status Examination (SLUMS). Patient scored  27/30 points with a score of 27 or above considered normal. Despite patient scoring WNL on all subtests, mild deficits in short-term memory are noted. SLP spoke to patient's son who reports mild short-term memory at baseline. SLP will continue to follow patient to maximize overall functional independence and safety prior to discharge.    Pain No/Denies Pain   Agitated Behavior Scale: TBI Observation Details Observation Environment: CIR Start of observation period - Date: 11/07/22 Start of observation period - Time: 0740 End of observation period - Date: 11/07/22 End of observation period - Time: 0830 Agitated Behavior Scale (DO NOT LEAVE BLANKS) Short attention span, easy distractibility, inability to concentrate: Absent Impulsive, impatient, low tolerance for pain or frustration: Absent Uncooperative, resistant to care, demanding: Absent Violent and/or threatening violence toward people or property: Absent Explosive and/or unpredictable anger: Absent Rocking, rubbing, moaning, or  other self-stimulating behavior: Absent Pulling at tubes, restraints, etc.: Absent Wandering from treatment areas: Absent Restlessness, pacing, excessive movement: Absent Repetitive behaviors, motor, and/or verbal: Absent Rapid, loud, or excessive talking: Absent Sudden changes of mood: Absent Easily initiated or excessive crying and/or laughter: Absent Self-abusiveness, physical and/or verbal: Absent Agitated behavior scale total score: 14  Therapy/Group: Individual Therapy  [name] 11/07/2022, 2:42 PM

## 2022-11-07 NOTE — Progress Notes (Signed)
Occupational Therapy Session Note  Patient Details  Name: Kim Burns MRN: [id number] Date of Birth: 1935-05-22  Today's Date: 11/07/2022 OT Individual Time: 7829-5621 OT Individual Time Calculation (min): 76 min    Short Term Goals: Week 2:  OT Short Term Goal 1 (Week 2): STG=LTG's d/t LOS  Skilled Therapeutic Interventions/Progress Updates:  Pt greeted supine in bed, pt agreeable to OT intervention. Session focus on BADL reeducation, functional mobility, dynamic standing balance and decreasing overall caregiver burden.              Pt completed supine>sit with CGA. Donned teds from EOB with total A as well as shoes. Pt completed sit>stand from EOB with MINA. Pt completed functional ambulation to sink with RW and CGA for grooming tasks. Pt able to stand at sink with supervision for > 5 mins for self care.   Pt transported to gym in w/c with total A for time mgmt. Pt completed dynamic balance task with an emphasis on stepping up/down on 3 inch step to simulate stair training for home in conjunction with dynamic reaching with unilateral support. Pt completed task with CGA, but needed heavy MODA to rise from w/c. Pt requires step by step cues for hand placement on chair when ascending and pattern for stepping up with good leg and down with bad leg.   Pt completed dynamic balance task with pt standing on airex cushion to aleranate BUEs reaching to remove clothespins from basketball hoop. Pt needed at least unilateral support to complete task with CGA. Pt continues to require MOD A for sit>stand from w/c. Built up arm rests on w/c to see if a higher arm rests would allow pt to stand more independently however no change noted.   Transported pt to apt to practice sit>stands from recliner as pt reports she sleeps in her recliner and spends most of her time in it. Pt continued to require MODA to stand even with pillow placed in seat. Education provided on equipment from "stander" that allows for  increased support when standing from furniture at home. Discussed various options such as purchase new recliner, build up recliner or have son present at all times to assist with sit>stands.   Pt transported back to room with total A with pt completing stand pivot to bed with RW and CGA.           Ended session with pt supine in bed with all needs within reach.              Therapy Documentation Precautions:  Precautions Precautions: Fall, Other (comment) Precaution Comments: L lower abdominal drain, vertigo, orthostatic hypotension Restrictions Weight Bearing Restrictions: Yes LLE Weight Bearing: Weight bearing as tolerated    Pain: 7/10 pain reported in LLE, rest breaks, repositioning and distraction provided as pain mgmt interventions.     Therapy/Group: Individual Therapy  [name] [facility] 11/07/2022, 12:04 PM

## 2022-11-07 NOTE — Progress Notes (Signed)
  [facility] Progress Note   Subjective:  Seen in room. No new complaints. Not sure about discharge. Dialysis today   Objective Vitals:   11/06/22 1700 11/06/22 1729 11/06/22 1834 11/07/22 0520  BP: (!) 114/51 111/61 135/67 (!) 162/73  Pulse: (!) 49 (!) 48 (!) 56 63  Resp: (!) 22 10 16 16   Temp:  (!) 97.5 F (36.4 C) 98.3 F (36.8 C) 98.4 F (36.9 C)  TempSrc:  Oral Oral Oral  SpO2: 100% 100% 100% 98%  Weight:  54.5 kg    Height:       Physical Exam General: Elderly woman, NAD. Room air Heart: RRR; no murmur Lungs: CTAB, no rales Abdomen: soft Extremities: 1+ BLE edema; 2+ L elbow region edema Dialysis Access: LUE AVF + bruit  Additional Objective Labs: Basic Metabolic Panel: Recent Labs  Lab 11/01/22 1330 11/04/22 1159 11/07/22 0054  NA 133* 130* 130*  K 3.5 4.2 3.8  CL 94* 95* 94*  CO2 26 24 27   GLUCOSE 81 154* 125*  BUN 38* 48* 24*  CREATININE 3.58* 4.08* 2.42*  CALCIUM 8.2* 8.8* 8.4*  PHOS 3.8 4.1  --    Liver Function Tests: Recent Labs  Lab 11/01/22 1330 11/04/22 1159  ALBUMIN 2.0* 2.1*   CBC: Recent Labs  Lab 11/01/22 1330 11/04/22 1159  WBC 7.7 10.2  HGB 10.8* 11.3*  HCT 33.4* 34.9*  MCV 97.1 95.4  PLT 241 264   Medications:    (feeding supplement) PROSource Plus  30 mL Oral BID BM   acetaminophen  500 mg Oral TID   amantadine  100 mg Oral Weekly   amiodarone  200 mg Oral Daily   aspirin  81 mg Oral Daily   Chlorhexidine Gluconate Cloth  6 each Topical Q0600   doxercalciferol  6 mcg Intravenous Q M,W,F-HD   insulin aspart  0-5 Units Subcutaneous QHS   insulin aspart  0-6 Units Subcutaneous TID WC   lidocaine  1 patch Transdermal Q24H   lipase/protease/amylase  36,000 Units Oral BID WC   metoprolol succinate  25 mg Oral Daily   rOPINIRole  0.5 mg Oral QHS   sevelamer carbonate  800 mg Oral TID WC   valACYclovir  500 mg Oral QHS   Dialysis Orders: MWF at [facility] 3.5hr, 350/A1.5, EDW 55kg, 2K/2C bath, L AVF, no  heparin - Mircera was on hold at time of admit - Hectoral IV q HD   Assessment/Plan: Head injury/fall from bed -> R frontal hemorrhagic contusion, R frontal ischemic CVA, small B SDHs, L posterior scalp hematoma, non-displaced L parietal-occipital bone fracture: Now in CIR ESRD: Continue HD on usual MWF schedule -> next HD 9/20 HTN/volume: BP fine but ongoing L arm and BLE edema on exam - will plan for ^ UFG with next HD. Dialysis access: Worsening LUE edema raising concern for stenosis.  Less concerning for infection. Appreciate VVS evaluation - no need for intervention at this time.  Anemia of ESRD: Hgb 10.8, no ESA at this time. Secondary hyperparathyroidism:  CorrCa/Phos to goal - continue Renvela + VDRA. Nutrition: Alb low, continue supplements.  Tomasa Blase PA-C  [facility] 11/07/2022,1:10 PM

## 2022-11-07 NOTE — Progress Notes (Signed)
Physical Therapy TBI Note  Patient Details  Name: Kim Burns MRN: [id number] Date of Birth: 09/11/1935  Today's Date: 11/07/2022 PT Individual Time: 1350-1430 PT Individual Time Calculation (min): 40 min   Short Term Goals: Week 1:  PT Short Term Goal 1 (Week 1): Pt will perform bed mobility with CGA. PT Short Term Goal 2 (Week 1): Pt will perform sit to stand with CGA. PT Short Term Goal 3 (Week 1): PT will perform bed to chair with CGA consistently. PT Short Term Goal 4 (Week 1): Pt will ambulate x50' with CGA and LRAD.   Skilled Therapeutic Interventions/Progress Updates:    Pt asleep in bed and slow to awaken, slightly grumpy initially at being woken up but then agreeable throughout session. Focused on functional bed mobility, transfers with RW, stair negotiation training for home entry, and gait training with RW. Pt performed bed mobility for supine to sit with supervision using bed rails for support but required mod assist for BLE management to return back to bed at end of session. Sit > stand from slightly elevated bed with min assist with cues for hand placement and technique for transfer with RW to the w/c. Stair negotiation with bilateral rails (home entry is single rail on R but started with bilateral rails for the first time) x 4 steps with CGA and then seated rest break due to fatigue and repeated second trial again with CGA overall for balance. Pt self selects to use RLE to descent first. Reports she has 13 steps at home and discussed goal is to continue to work on tolerance to this in therapies. Pt in agreement. Gait training for functional mobility training and overall strengthening/endurance x 150' with close supervision to CGA overall for balance with cues for upright posture. End of session returned back to bed as described above.  Therapy Documentation Precautions:  Precautions Precautions: Fall, Other (comment) Precaution Comments: L lower abdominal drain, vertigo,  orthostatic hypotension Restrictions Weight Bearing Restrictions: Yes LLE Weight Bearing: Weight bearing as tolerated    Pain:  Reports pain is manageable at the moment. Did not impact therapy session.  Agitated Behavior Scale: TBI Observation Details Observation Environment: therapy Start of observation period - Date: 11/07/22 Start of observation period - Time: 1350 End of observation period - Date: 11/07/22 End of observation period - Time: 1430 Agitated Behavior Scale (DO NOT LEAVE BLANKS) Short attention span, easy distractibility, inability to concentrate: Present to a slight degree Impulsive, impatient, low tolerance for pain or frustration: Absent Uncooperative, resistant to care, demanding: Absent Violent and/or threatening violence toward people or property: Absent Explosive and/or unpredictable anger: Absent Rocking, rubbing, moaning, or other self-stimulating behavior: Absent Pulling at tubes, restraints, etc.: Absent Wandering from treatment areas: Absent Restlessness, pacing, excessive movement: Absent Repetitive behaviors, motor, and/or verbal: Absent Rapid, loud, or excessive talking: Absent Sudden changes of mood: Absent Easily initiated or excessive crying and/or laughter: Absent Self-abusiveness, physical and/or verbal: Absent Agitated behavior scale total score: 15   Therapy/Group: Individual Therapy  Karolee Stamps Darrol Poke, PT, DPT, CBIS  11/07/2022, 2:35 PM

## 2022-11-07 NOTE — Progress Notes (Signed)
PROGRESS NOTE   Subjective/Complaints: Pt feels fatigued. Doesn't want to do anymore of the "vertigo exercises". Says they're difficult for her to tolerate but admits that they do seem to be helping. Feels at times that she's going to give out. Left hip pain makes it difficult to bear weight and advance left leg.   ROS: Patient denies fever, rash, sore throat, blurred vision,  nausea, vomiting, diarrhea, cough, shortness of breath or chest pain, joint or back/neck pain, headache, or mood change.    Objective:   No results found. Recent Labs    11/04/22 1159  WBC 10.2  HGB 11.3*  HCT 34.9*  PLT 264   Recent Labs    11/04/22 1159 11/07/22 0054  NA 130* 130*  K 4.2 3.8  CL 95* 94*  CO2 24 27  GLUCOSE 154* 125*  BUN 48* 24*  CREATININE 4.08* 2.42*  CALCIUM 8.8* 8.4*    Intake/Output Summary (Last 24 hours) at 11/07/2022 1000 Last data filed at 11/07/2022 0911 Gross per 24 hour  Intake 238 ml  Output 1500 ml  Net -1262 ml     Pressure Injury 10/21/22 Buttocks Right Deep Tissue Pressure Injury - Purple or maroon localized area of discolored intact skin or blood-filled blister due to damage of underlying soft tissue from pressure and/or shear. 2 areas of deep tissue pressure in (Active)  10/21/22 1145  Location: Buttocks  Location Orientation: Right  Staging: Deep Tissue Pressure Injury - Purple or maroon localized area of discolored intact skin or blood-filled blister due to damage of underlying soft tissue from pressure and/or shear.  Wound Description (Comments): 2 areas of deep tissue pressure injuries  Present on Admission: Yes    Physical Exam: Vital Signs Blood pressure (!) 162/73, pulse 63, temperature 98.4 F (36.9 C), temperature source Oral, resp. rate 16, height 5\' 3"  (1.6 m), weight 54.5 kg, SpO2 98%.   Constitutional: No distress . Frail appearing.  Vital signs reviewed. HEENT: NCAT, EOMI, oral  membranes moist Neck: supple Cardiovascular: RRR without murmur. No JVD    Respiratory/Chest: CTA Bilaterally without wheezes or rales. Normal effort    GI/Abdomen: BS +, non-tender, non-distended Ext: no clubbing, cyanosis, or edema Psych: pleasant and cooperative  Skin: Left arm swelling, crepey skin with redness/bruising below AVF--sl improved.  Right buttock blister. Nephrostomy tube/drain in place.  Neuro:  awake, oriented.   Fair to functional Memory. Normal language and speech. Cranial nerve exam unremarkable. MMT: RUE 4/5, LUE 4/5. RLE 3/5 prox to 4/5 distally. LLE 1/5 hip to 4/5 ADF/PF. Sensory exam normal for light touch and pain in all 4 limbs. No limb ataxia. Did not see nystagmus but I didn't turn her head either  Musculoskeletal: Left hip tender with palpation and attempts at PROM. Left chest wall remains sore too    Assessment/Plan: 1. Functional deficits which require 3+ hours per day of interdisciplinary therapy in a comprehensive inpatient rehab setting. Physiatrist is providing close team supervision and 24 hour management of active medical problems listed below. Physiatrist and rehab team continue to assess barriers to discharge/monitor patient progress toward functional and medical goals  Care Tool:  Bathing    Body parts  bathed by patient: Right arm, Chest, Left arm, Abdomen, Front perineal area, Buttocks, Face, Right upper leg, Left upper leg   Body parts bathed by helper: Right lower leg, Left lower leg     Bathing assist Assist Level: Moderate Assistance - Patient 50 - 74%     Upper Body Dressing/Undressing Upper body dressing   What is the patient wearing?: Pull over shirt    Upper body assist Assist Level: Contact Guard/Touching assist    Lower Body Dressing/Undressing Lower body dressing      What is the patient wearing?: Pants, Incontinence brief     Lower body assist Assist for lower body dressing: Moderate Assistance - Patient 50 - 74%      Toileting Toileting Toileting Activity did not occur Press photographer and hygiene only): N/A (no void or bm)  Toileting assist Assist for toileting: Minimal Assistance - Patient > 75%     Transfers Chair/bed transfer  Transfers assist  Chair/bed transfer activity did not occur: Safety/medical concerns (due to dizziness)  Chair/bed transfer assist level: Minimal Assistance - Patient > 75%     Locomotion Ambulation   Ambulation assist      Assist level: Contact Guard/Touching assist Assistive device: Walker-rolling Max distance: 150'   Walk 10 feet activity   Assist     Assist level: Contact Guard/Touching assist Assistive device: Walker-rolling   Walk 50 feet activity   Assist Walk 50 feet with 2 turns activity did not occur: Safety/medical concerns  Assist level: Contact Guard/Touching assist Assistive device: Walker-rolling    Walk 150 feet activity   Assist Walk 150 feet activity did not occur: Safety/medical concerns  Assist level: Contact Guard/Touching assist Assistive device: Walker-rolling    Walk 10 feet on uneven surface  activity   Assist     Assist level: Minimal Assistance - Patient > 75% Assistive device: Other (comment) (2 handrails)   Wheelchair     Assist Is the patient using a wheelchair?: Yes Type of Wheelchair: Manual    Wheelchair assist level: Dependent - Patient 0% Max wheelchair distance: 150'    Wheelchair 50 feet with 2 turns activity    Assist        Assist Level: Dependent - Patient 0%   Wheelchair 150 feet activity     Assist          Blood pressure (!) 162/73, pulse 63, temperature 98.4 F (36.9 C), temperature source Oral, resp. rate 16, height 5\' 3"  (1.6 m), weight 54.5 kg, SpO2 98%.  Medical Problem List and Plan: 1. Functional deficits secondary to TBI and polytrauma due to a fall with associated Croop contrecoup brain injury, right frontal lobe hemorrhagic contusion and  right frontal ischemic nonhemorrhagic infarct.             -patient may shower             -ELOS/Goals: extending stay to 9/24 potentially, PT/OT/SLP mod I to supervision             -Continue CIR therapies including PT, OT, and SLP. Interdisciplinary team conference today to discuss goals, barriers to discharge, and dc planning.    -pt's vertigo subjectively better. Fatigue and pain also barriers. Encouraged her to work through.  2.  Antithrombotics: -DVT/anticoagulation:  Mechanical: Sequential compression devices, below knee Bilateral lower extremities             -antiplatelet therapy: ASA 3. Pain Management: Tylenol prn.  4. Mood/Behavior/Sleep: LCSW to follow for evaluation and support.              -  antipsychotic agents: N/A             --chronic insomnia. Napping during day creates a problem   9/19 continue trial of amantadine 100mg  weekly (ESRD) after discussion with pharmacy 5. Neuropsych/cognition: This patient is not fully capable of making decisions on her own behalf. 6. Skin/Wound Care: Routine pressure relief measures.  7. Fluids/Electrolytes/Nutrition: Strict I/O. Change to renal diet. Continue 1500 cc FR. -hypokalemia--per renal team for replacement  8. A fib w/RVR/Acute systolic CHF: Fluid overload due to A fib and managed with HD.              --Duo nebs prn added   for SOB? 1500 cc FR/monitor weights daily.              --monitor HR TID--on amiodarone taper. Continue metoprolol Filed Weights   11/06/22 1340 11/06/22 1729  Weight: 55.9 kg 54.5 kg     9. HTN: Monitor BP TID--on metoprolol bid.  Monitor with mobility  9/18 controlled 10. ESRD: HD MWF at the end of the day to help with tolerance of therapy             --hx of intradialysis hypotension (midodrine in the past)   -AVF ok per vascular surgery 11. Pancreatic insufficiency s/p Whipple: Continue Creon with meals  BID ac             --has dumping syndrome (abdominal pain with meals followed by diarrhea at  times) 12. Left pubic rami/Sacral Ala at S4: WBAT LLE per Dr. Thad Ranger. Follow up Xrays in 2 weeks. 13. L-renal mass (likely RCC) urine leak s/p chronic nephrostomy tube: Tube exchanged out on 09/03 by IR per protocol             --not on AC due to bleeding from nephrostomy tube/kidney.  Measure output daily.   14. T2DM diet controlled w/peripheral neuropathy: Monitor BS ac/hs and use SSI for elevated BS.  --Hgb A1C- 6.1. will add CM restrictions.   15. Likely BPPV--  some improvement               --an orthostatic component also      -  meclizine  change to prn    16. Anemia of CKD: Monitor with serial checks. H/H stable in 10 range.    17. H/o Shingles w/optic nerve involvement: Continue Valtrex.  18. Restless leg syndrome: Continue Requip 0.5 mg HS with additional 0.25 mg prn during the day.     19. Bradycardia: resolved, continue to monitor HR TID  20. Orthostasis: continue teds with therapy only   -resting bp's better with activity.   22. L forearm swelling and redness-  stable  -continue to elevate LUE as possible    LOS: 8 days A FACE TO FACE EVALUATION WAS PERFORMED  Ranelle Oyster 11/07/2022, 10:00 AM

## 2022-11-07 NOTE — Progress Notes (Signed)
Patient ID: Kim Burns, female   DOB: 31-Dec-1935, 87 y.o.   MRN: [id number]  SW met with pt in room to discuss extension to next week. Pt is amenable if she feels this is needed. SW waiting on updates from medical team.  *attending amenable to extension to 9/26  1458-SW spoke with pt son [name] to discuss above. He thinks more time will allow his mother better recovery. SW discussed family edu. Fam edu scheduled for Tues 1pm-4pm.  Cecile Sheerer, MSW, LCSWA Office: [phone] Cell: [phone] Fax: [phone]

## 2022-11-08 LAB — GLUCOSE, CAPILLARY
Glucose-Capillary: 87 mg/dL (ref 70–99)
Glucose-Capillary: 103 mg/dL — ABNORMAL HIGH (ref 70–99)

## 2022-11-08 MED ORDER — PENTAFLUOROPROP-TETRAFLUOROETH EX AERO: 1.0000 | INHALATION_SPRAY | CUTANEOUS | Status: DC | PRN

## 2022-11-08 MED ORDER — HEPARIN SODIUM (PORCINE) 1000 UNIT/ML DIALYSIS
20.0000 [IU]/kg | INTRAMUSCULAR | Status: DC | PRN
  Administered 2022-11-08: 1100 [IU] via INTRAVENOUS_CENTRAL
  Filled 2022-11-08: qty 2

## 2022-11-08 NOTE — Progress Notes (Signed)
[facility] Progress Note   Subjective: Seen in room. Says she had great morning but now says she doesn't feel well. Nonspecific as to what is wrong. Denies SOB/Chest pain.   Objective Vitals:   11/07/22 0520 11/07/22 1458 11/07/22 1932 11/08/22 0529  BP: (!) 162/73 (!) 144/66 136/73 (!) 163/82  Pulse: 63 (!) 58 (!) 57 (!) 59  Resp: 16 17 18 18   Temp: 98.4 F (36.9 C) 97.6 F (36.4 C) 97.9 F (36.6 C) 97.8 F (36.6 C)  TempSrc: Oral Oral Oral Oral  SpO2: 98% 100% 98% 99%  Weight:      Height:       Physical Exam General: Frail elderly female in NAD Heart: S1,S2 RRR Lungs: CTAB A/P decreased in bases Abdomen: NABS NT. Nephrostomy tube to drain.  Extremities:No LE edema Dialysis Access: L AVF + T/B    Additional Objective Labs: Basic Metabolic Panel: Recent Labs  Lab 11/01/22 1330 11/04/22 1159 11/07/22 0054  NA 133* 130* 130*  K 3.5 4.2 3.8  CL 94* 95* 94*  CO2 26 24 27   GLUCOSE 81 154* 125*  BUN 38* 48* 24*  CREATININE 3.58* 4.08* 2.42*  CALCIUM 8.2* 8.8* 8.4*  PHOS 3.8 4.1  --    Liver Function Tests: Recent Labs  Lab 11/01/22 1330 11/04/22 1159  ALBUMIN 2.0* 2.1*   No results for input(s): "LIPASE", "AMYLASE" in the last 168 hours. CBC: Recent Labs  Lab 11/01/22 1330 11/04/22 1159  WBC 7.7 10.2  HGB 10.8* 11.3*  HCT 33.4* 34.9*  MCV 97.1 95.4  PLT 241 264   Blood Culture    Component Value Date/Time   SDES BLOOD RIGHT ANTECUBITAL 01/22/2022 0038   SPECREQUEST  01/22/2022 0038    BOTTLES DRAWN AEROBIC AND ANAEROBIC Blood Culture results may not be optimal due to an inadequate volume of blood received in culture bottles   CULT  01/22/2022 0038    NO GROWTH 5 DAYS Performed at [facility], [address] [place], [place] [address]    REPTSTATUS 01/27/2022 FINAL 01/22/2022 0038    Cardiac Enzymes: No results for input(s): "CKTOTAL", "CKMB", "CKMBINDEX", "TROPONINI" in the last 168 hours. CBG: Recent Labs  Lab  11/07/22 0615 11/07/22 1139 11/07/22 1640 11/07/22 2042 11/08/22 0611  GLUCAP 88 101* 172* 183* 87   Iron Studies: No results for input(s): "IRON", "TIBC", "TRANSFERRIN", "FERRITIN" in the last 72 hours. @lablastinr3 @ Studies/Results: No results found. Medications:   (feeding supplement) PROSource Plus  30 mL Oral BID BM   acetaminophen  500 mg Oral TID   amantadine  100 mg Oral Weekly   amiodarone  200 mg Oral Daily   aspirin  81 mg Oral Daily   Chlorhexidine Gluconate Cloth  6 each Topical Q0600   doxercalciferol  6 mcg Intravenous Q M,W,F-HD   insulin aspart  0-5 Units Subcutaneous QHS   insulin aspart  0-6 Units Subcutaneous TID WC   lidocaine  1 patch Transdermal Q24H   lipase/protease/amylase  36,000 Units Oral BID WC   metoprolol succinate  25 mg Oral Daily   rOPINIRole  0.5 mg Oral QHS   sevelamer carbonate  800 mg Oral TID WC   valACYclovir  500 mg Oral QHS     Dialysis Orders: MWF at [facility] 3.5hr, 350/A1.5, EDW 55kg, 2K/2C bath, L AVF, no heparin - Mircera was on hold at time of admit - Hectoral IV q HD   Assessment/Plan: Head injury/fall from bed -> R frontal hemorrhagic  contusion, R frontal ischemic CVA, small B SDHs, L posterior scalp hematoma, non-displaced L parietal-occipital bone fracture: Now in CIR ESRD: Continue HD on usual MWF schedule -> next HD 9/20 HTN/volume: BP fine but ongoing L arm and BLE edema on exam. UF as tolerated. Currently under OP EDW.  Dialysis access: Worsening LUE edema raising concern for stenosis.  Less concerning for infection. Appreciate VVS evaluation - no need for intervention at this time.  Anemia of ESRD: Hgb 10.8, no ESA at this time. Secondary hyperparathyroidism:  CorrCa/Phos to goal - continue Renvela + VDRA. Nutrition: Alb low, continue supplements.    Kim Bin H. Lanitra Battaglini NP-C 11/08/2022, 10:58 AM  [employer] [phone]

## 2022-11-08 NOTE — Progress Notes (Signed)
Physical Therapy Weekly Progress Note  Patient Details  Name: Kim Burns MRN: [id number] Date of Birth: 1935/04/18  Beginning of progress report period: October 31, 2022 End of progress report period: November 08, 2022  Today's Date: 11/08/2022 PT Individual Time: 1040-1100 PT Individual Time Calculation (min): 20 min  Pt presents in w/c asleep but arousable. Pt stating she feels terrible, has a headache, and doesn't feel like she can do anything. Offered to take patient outside for change in environment/fresh air, etc but she declined stating "I just can't do anything". Offered to do therapy in room, seated activities if needed, pt continued to decline stating "I told you I just can't do anything" and request to lay down. Extra time for processing and cues for technique, heavy min assist for sit > stand and CGA for ambulatory transfer to the bed with RW. Required mod assist to return to supine due to BLE management required. Repositioned with bridging with supervision. All needs in reach. Pt missed 55 min of skilled PT.   Patient has met 1 of 4 short term goals.  Pt is making slow progress. Pt continues to be limited at times due to pain and vestibular issues as well as intermittent orthostatic hypotension. Pt also presents with overall fatigue. Pt continues to demonstrate difficulty with lower surface transfers requiring min to mod assist for sit <> stand with RW. Pt is able to perform gait with RW up to 150' with CGA. Pt has a flight of stairs to navigate for home entry which is limited by endurance at this time. D/c date extended by team until 9/26 to allow time for increased independence, improved strength, and overall improved endurance.   Patient continues to demonstrate the following deficits muscle weakness and muscle joint tightness, decreased cardiorespiratoy endurance, decreased motor planning, vestibular, and decreased standing balance and decreased balance strategies and  therefore will continue to benefit from skilled PT intervention to increase functional independence with mobility.  Patient progressing toward long term goals..  Plan of care revisions: downgraded car transfer to min assist due to pt requiring assist with low surfaces.  PT Short Term Goals Week 1:  PT Short Term Goal 1 (Week 1): Pt will perform bed mobility with CGA. PT Short Term Goal 1 - Progress (Week 1): Progressing toward goal PT Short Term Goal 2 (Week 1): Pt will perform sit to stand with CGA. PT Short Term Goal 2 - Progress (Week 1): Progressing toward goal PT Short Term Goal 3 (Week 1): PT will perform bed to chair with CGA consistently. PT Short Term Goal 3 - Progress (Week 1): Progressing toward goal PT Short Term Goal 4 (Week 1): Pt will ambulate x50' with CGA and LRAD. PT Short Term Goal 4 - Progress (Week 1): Met Week 2:  PT Short Term Goal 1 (Week 2): = LTGs due to ELOS  Skilled Therapeutic Interventions/Progress Updates:  Ambulation/gait training;Community reintegration;DME/adaptive equipment instruction;Neuromuscular re-education;Stair training;UE/LE Strength taining/ROM;Psychosocial support;Balance/vestibular training;Discharge planning;Functional electrical stimulation;Pain management;Skin care/wound management;UE/LE Coordination activities;Therapeutic Activities;Cognitive remediation/compensation;Functional mobility training;Patient/family education;Therapeutic Exercise;Splinting/orthotics;Wheelchair propulsion/positioning;Disease management/prevention   Therapy Documentation Precautions:  Precautions Precautions: Fall, Other (comment) Precaution Comments: L lower abdominal drain, vertigo, orthostatic hypotension Restrictions Weight Bearing Restrictions: Yes LLE Weight Bearing: Weight bearing as tolerated General: PT Amount of Missed Time (min): 55 Minutes PT Missed Treatment Reason: Patient ill (Comment);Pain;Patient fatigue  Pain:  C/o headache, upset stomach, and  fatigue - RN made aware and medication given.   Therapy/Group: Individual Therapy  Karolee Stamps Fonda Kinder  Justice Rocher, PT, DPT, CBIS  11/08/2022, 11:08 AM

## 2022-11-08 NOTE — Progress Notes (Signed)
Speech Language Pathology Weekly Progress and Session Note  Patient Details  Name: Kim Burns MRN: [id number] Date of Birth: Jul 25, 1935  Beginning of progress report period: November 01, 2022 End of progress report period: November 08, 2022  Today's Date: 11/08/2022 SLP Individual Time: 7846-9629 SLP Individual Time Calculation (min): 55 min  Short Term Goals: Week 1: SLP Short Term Goal 1 (Week 1): Patient will utilize memory compensatory strategies to recall daily, functional information with Min verbal cues. SLP Short Term Goal 1 - Progress (Week 1): Met SLP Short Term Goal 2 (Week 1): Patient will utilize external aids for recall of date with Mod I. SLP Short Term Goal 2- Progress (Week 1): Not Met SLP Short Term Goal 3 (Week 1): Patient will demonstrate functional problem solving for basic and familiar tasks with Min verbal cues. SLP Short Term Goal 3 - Progress (Week 1): Met    New Short Term Goals: Week 2: SLP Short Term Goal 1 (Week 2): STGs=LTGs due to ELOS  Weekly Progress Updates: Patient has made functional gains and has met 2 of 3 STGs this reporting period. Currently, patient requires supervision-Min A verbal cues to complete functional and familiar tasks safely in regards to problem solving with mildly complex tasks and recall with use of compensatory strategies. Patient's overall function can fluctuate at times and can be impacted by pain and fatigue. Patient and family education ongoing. Patient would benefit from continued skilled SLP intervention to maximize her cognitive functioning and overall functional independence prior to discharge.      Intensity: Minumum of 1-2 x/day, 30 to 90 minutes Frequency: 3 to 5 out of 7 days Duration/Length of Stay: 11/14/22 Treatment/Interventions: Cognitive remediation/compensation;Cueing hierarchy;Dysphagia/aspiration precaution training;Environmental controls;Functional tasks;Internal/external aids;Patient/family  education;Therapeutic Activities   Daily Session  Skilled Therapeutic Interventions:  Skilled treatment session focused on cognitive goals. Upon arrival, patient was awake while upright in the wheelchair and appeared brighter compared to previous sessions. SLP facilitated session by providing Min A verbal cues for complex problem solving and organization during a scheduling task. SLP also educated on how to live a healthy brain lifestyle to maximize overall brain health.  Patient verbalized understanding and independently reported how to incorporate strategies at home. Patient left upright in the wheelchair with alarm on and all needs within reach. Patient requested her needle point materials to work on in between therapies. Continue with current plan of care.    Pain No/Denies Pain   ABS: ABS discontinued d/t ABS score less than 20 for the last three days or no behaviors present   Therapy/Group: Individual Therapy  Louiza Moor 11/08/2022, 6:39 AM

## 2022-11-08 NOTE — Progress Notes (Signed)
Contacted [facility] to advise clinic staff of change in pt's d/c date from rehab. Pt will d/c on Thurs, Sept 26. Will assist as needed.   Olivia Canter Renal Navigator [phone]

## 2022-11-08 NOTE — Procedures (Signed)
HD Note:  Some information was entered later than the data was gathered due to patient care needs. The stated time with the data is accurate.  Received patient in bed to unit.   Alert and oriented.   Informed consent signed and in chart.   Access used: Upper left arm fistula Access issues: None  Patient BP was low in the middle of the treatment resulting in UF being turned off while BP recovered. Patient had complaints of leg pain during treatment.  She stated that it felt like restless leg and did not feel like cramping.  Meds given, see MAR  The medication was effective. Patient stated she was having pain at her needle sites and ended treatment 9 min early.  No apparent issue with the fistula related to the complaint.   TX duration: 3.5 hours  Alert, without acute distress.  Total UF removed: 1900 ml  Hand-off given to patient's nurse.   Transported back to the room   [name] L. Dareen Piano, RN [facility] D[facility] Unit.

## 2022-11-08 NOTE — Plan of Care (Signed)
  Problem: RH Car Transfers Goal: LTG Patient will perform car transfers with assist (PT) Description: LTG: Patient will perform car transfers with assistance (PT). Flowsheets (Taken 11/08/2022 0748) LTG: Pt will perform car transfers with assist:: (downgraded) Minimal Assistance - Patient > 75% Note: Downgraded due to assist needed for lower surfaces

## 2022-11-08 NOTE — Progress Notes (Signed)
Occupational Therapy Session Note  Patient Details  Name: Kim Burns MRN: [id number] Date of Birth: 12-Jan-1936  Today's Date: 11/08/2022 OT Individual Time: 0800-0900 OT Individual Time Calculation (min): 60 min    Short Term Goals: Week 2:  OT Short Term Goal 1 (Week 2): STG=LTG's d/t LOS  Skilled Therapeutic Interventions/Progress Updates:   Full am self care and toileting routine focused on standing tolerance and balance, activity tolerance and functional reach. Pt bed level upon OT arrival. Open to all activity presented with improved overall level of participation. Pt moved to EOB with close S, SPT to commode with CGA. Managed pull on pants and brief down and up after toileting with min A. Mod A use of reacher to don clean pants over feet, mod A to cleanse buttocks post BM and set up for post voiding peri hygiene. Pt then transferred with RW sit to stand and amb to sink side 15 ft with CGA. Stood for 4 intervals of 2 min with close s for oral and hair care. Seated UB sponge bathing and pull on shirt with set up only. Left pt seated in w/c with all needs, chair alarm and safety measures in reach.     Pain: no reports of pain this am  Therapy Documentation Precautions:  Precautions Precautions: Fall, Other (comment) Precaution Comments: L lower abdominal drain, vertigo, orthostatic hypotension Restrictions Weight Bearing Restrictions: Yes LLE Weight Bearing: Weight bearing as tolerated  Therapy/Group: Individual Therapy  Vicenta Dunning 11/08/2022, 7:50 AM

## 2022-11-09 DIAGNOSIS — S069XAA Unspecified intracranial injury with loss of consciousness status unknown, initial encounter: Secondary | ICD-10-CM

## 2022-11-09 LAB — GLUCOSE, CAPILLARY
Glucose-Capillary: 112 mg/dL — ABNORMAL HIGH (ref 70–99)
Glucose-Capillary: 161 mg/dL — ABNORMAL HIGH (ref 70–99)
Glucose-Capillary: 156 mg/dL — ABNORMAL HIGH (ref 70–99)
Glucose-Capillary: 155 mg/dL — ABNORMAL HIGH (ref 70–99)

## 2022-11-09 MED ORDER — BENZONATATE 100 MG PO CAPS
100.0000 mg | ORAL_CAPSULE | Freq: Three times a day (TID) | ORAL | Status: DC
  Administered 2022-11-09 – 2022-11-16 (×18): 100 mg via ORAL
  Filled 2022-11-09 (×18): qty 1

## 2022-11-09 MED ORDER — GUAIFENESIN ER 600 MG PO TB12: 600.0000 mg | ORAL_TABLET | Freq: Two times a day (BID) | ORAL | Status: DC

## 2022-11-09 MED ORDER — GUAIFENESIN ER 600 MG PO TB12
1200.0000 mg | ORAL_TABLET | Freq: Two times a day (BID) | ORAL | Status: DC
  Administered 2022-11-09 – 2022-11-16 (×15): 1200 mg via ORAL
  Filled 2022-11-09 (×15): qty 2

## 2022-11-09 NOTE — Plan of Care (Signed)
  Problem: Consults Goal: RH STROKE PATIENT EDUCATION Description: See Patient Education module for education specifics  Outcome: Progressing   Problem: RH BOWEL ELIMINATION Goal: RH STG MANAGE BOWEL WITH ASSISTANCE Description: STG Manage Bowel with toileting Assistance. Outcome: Progressing Goal: RH STG MANAGE BOWEL W/MEDICATION W/ASSISTANCE Description: STG Manage Bowel with Medication with mod I Assistance. Outcome: Progressing   Problem: RH SKIN INTEGRITY Goal: RH STG SKIN FREE OF INFECTION/BREAKDOWN Description: Manage skin with min assist Outcome: Progressing Goal: RH STG MAINTAIN SKIN INTEGRITY WITH ASSISTANCE Description: STG Maintain Skin Integrity With min Assistance. Outcome: Progressing   Problem: RH SAFETY Goal: RH STG ADHERE TO SAFETY PRECAUTIONS W/ASSISTANCE/DEVICE Description: STG Adhere to Safety Precautions With cues Assistance/Device. Outcome: Progressing   Problem: RH KNOWLEDGE DEFICIT Goal: RH STG INCREASE KNOWLEDGE OF HYPERTENSION Description: Patient and family will be able to manage HTN/Syncope with medication and dietary modification using educational resources independently Outcome: Progressing Goal: RH STG INCREASE KNOWLEDGE OF STROKE PROPHYLAXIS Description: Patient and family will be able to manage secondary risks with medication and dietary modification using educational resources independently Outcome: Progressing   Problem: Education: Goal: Knowledge of General Education information will improve Description: Including pain rating scale, medication(s)/side effects and non-pharmacologic comfort measures Outcome: Progressing   Problem: Health Behavior/Discharge Planning: Goal: Ability to manage health-related needs will improve Outcome: Progressing   Problem: Clinical Measurements: Goal: Ability to maintain clinical measurements within normal limits will improve Outcome: Progressing Goal: Will remain free from infection Outcome:  Progressing Goal: Diagnostic test results will improve Outcome: Progressing Goal: Respiratory complications will improve Outcome: Progressing Goal: Cardiovascular complication will be avoided Outcome: Progressing   Problem: Activity: Goal: Risk for activity intolerance will decrease Outcome: Progressing   Problem: Nutrition: Goal: Adequate nutrition will be maintained Outcome: Progressing   Problem: Coping: Goal: Level of anxiety will decrease Outcome: Progressing   Problem: Elimination: Goal: Will not experience complications related to bowel motility Outcome: Progressing Goal: Will not experience complications related to urinary retention Outcome: Progressing   Problem: Pain Managment: Goal: General experience of comfort will improve Outcome: Progressing

## 2022-11-09 NOTE — Progress Notes (Signed)
PROGRESS NOTE   Subjective/Complaints:  Pt reports doing OK, but having a little cough- doesn't want liquid.to treat- hard to swallow-   Hard to do therapy yesterday.   On air mattress Was nauseated again yesterday   ROS:  Pt denies SOB but has "chronic intermittent cough" , abd pain, CP, N/V/C/D, and vision changes   Objective:   No results found. No results for input(s): "WBC", "HGB", "HCT", "PLT" in the last 72 hours.  Recent Labs    11/07/22 0054  NA 130*  K 3.8  CL 94*  CO2 27  GLUCOSE 125*  BUN 24*  CREATININE 2.42*  CALCIUM 8.4*    Intake/Output Summary (Last 24 hours) at 11/09/2022 1211 Last data filed at 11/09/2022 2440 Gross per 24 hour  Intake 357 ml  Output 1950 ml  Net -1593 ml     Pressure Injury 10/21/22 Buttocks Right Deep Tissue Pressure Injury - Purple or maroon localized area of discolored intact skin or blood-filled blister due to damage of underlying soft tissue from pressure and/or shear. 2 areas of deep tissue pressure in (Active)  10/21/22 1145  Location: Buttocks  Location Orientation: Right  Staging: Deep Tissue Pressure Injury - Purple or maroon localized area of discolored intact skin or blood-filled blister due to damage of underlying soft tissue from pressure and/or shear.  Wound Description (Comments): 2 areas of deep tissue pressure injuries  Present on Admission: Yes    Physical Exam: Vital Signs Blood pressure (!) 151/68, pulse 62, temperature 97.7 F (36.5 C), temperature source Oral, resp. rate 18, height 5\' 3"  (1.6 m), weight 54.5 kg, SpO2 100%.     General: awake, alert, appropriate, just woke up;  NAD HENT: conjugate gaze; oropharynx moist CV: regular rate; no JVD Pulmonary: CTA B/L; no W/R/R- good air movement GI: soft, NT, ND, (+)BS Psychiatric: appropriate-  Neurological: sleepy, but woke to stimuli-  Skin: Left arm swelling, crepey skin with  redness/bruising below AVF--sl improved.  Right buttock blister. Nephrostomy tube/drain in place.  Neuro:  awake, oriented.   Fair to functional Memory. Normal language and speech. Cranial nerve exam unremarkable. MMT: RUE 4/5, LUE 4/5. RLE 3/5 prox to 4/5 distally. LLE 1/5 hip to 4/5 ADF/PF. Sensory exam normal for light touch and pain in all 4 limbs. No limb ataxia. Did not see nystagmus but I didn't turn her head either  Musculoskeletal: Left hip tender with palpation and attempts at PROM. Left chest wall remains sore too    Assessment/Plan: 1. Functional deficits which require 3+ hours per day of interdisciplinary therapy in a comprehensive inpatient rehab setting. Physiatrist is providing close team supervision and 24 hour management of active medical problems listed below. Physiatrist and rehab team continue to assess barriers to discharge/monitor patient progress toward functional and medical goals  Care Tool:  Bathing    Body parts bathed by patient: Right arm, Chest, Left arm, Abdomen, Front perineal area, Buttocks, Face, Right upper leg, Left upper leg   Body parts bathed by helper: Right lower leg, Left lower leg     Bathing assist Assist Level: Moderate Assistance - Patient 50 - 74%     Upper Body Dressing/Undressing Upper  body dressing   What is the patient wearing?: Pull over shirt    Upper body assist Assist Level: Contact Guard/Touching assist    Lower Body Dressing/Undressing Lower body dressing      What is the patient wearing?: Pants, Incontinence brief     Lower body assist Assist for lower body dressing: Moderate Assistance - Patient 50 - 74%     Toileting Toileting Toileting Activity did not occur Press photographer and hygiene only): N/A (no void or bm)  Toileting assist Assist for toileting: Minimal Assistance - Patient > 75%     Transfers Chair/bed transfer  Transfers assist  Chair/bed transfer activity did not occur: Safety/medical concerns  (due to dizziness)  Chair/bed transfer assist level: Contact Guard/Touching assist     Locomotion Ambulation   Ambulation assist      Assist level: Contact Guard/Touching assist Assistive device: Walker-rolling Max distance: 150'   Walk 10 feet activity   Assist     Assist level: Contact Guard/Touching assist Assistive device: Walker-rolling   Walk 50 feet activity   Assist Walk 50 feet with 2 turns activity did not occur: Safety/medical concerns  Assist level: Contact Guard/Touching assist Assistive device: Walker-rolling    Walk 150 feet activity   Assist Walk 150 feet activity did not occur: Safety/medical concerns  Assist level: Contact Guard/Touching assist Assistive device: Walker-rolling    Walk 10 feet on uneven surface  activity   Assist     Assist level: Minimal Assistance - Patient > 75% Assistive device: Other (comment) (2 handrails)   Wheelchair     Assist Is the patient using a wheelchair?: Yes Type of Wheelchair: Manual    Wheelchair assist level: Dependent - Patient 0% Max wheelchair distance: 150'    Wheelchair 50 feet with 2 turns activity    Assist        Assist Level: Dependent - Patient 0%   Wheelchair 150 feet activity     Assist          Blood pressure (!) 151/68, pulse 62, temperature 97.7 F (36.5 C), temperature source Oral, resp. rate 18, height 5\' 3"  (1.6 m), weight 54.5 kg, SpO2 100%.  Medical Problem List and Plan: 1. Functional deficits secondary to TBI and polytrauma due to a fall with associated Croop contrecoup brain injury, right frontal lobe hemorrhagic contusion and right frontal ischemic nonhemorrhagic infarct.             -patient may shower             -ELOS/Goals: extending stay to 9/24 potentially, PT/OT/SLP mod I to supervision             -Continue CIR therapies including PT, OT, and SLP. Interdisciplinary team conference today to discuss goals, barriers to discharge, and dc  planning.    -pt's vertigo subjectively better. Fatigue and pain also barriers. Encouraged her to work through.   Con't CIR PT, OT and SLP 2.  Antithrombotics: -DVT/anticoagulation:  Mechanical: Sequential compression devices, below knee Bilateral lower extremities             -antiplatelet therapy: ASA 3. Pain Management: Tylenol prn.  4. Mood/Behavior/Sleep: LCSW to follow for evaluation and support.              -antipsychotic agents: N/A             --chronic insomnia. Napping during day creates a problem   9/19 continue trial of amantadine 100mg  weekly (ESRD) after discussion with pharmacy 5. Neuropsych/cognition:  This patient is not fully capable of making decisions on her own behalf. 6. Skin/Wound Care: Routine pressure relief measures.  7. Fluids/Electrolytes/Nutrition: Strict I/O. Change to renal diet. Continue 1500 cc FR. -hypokalemia--per renal team for replacement  8. A fib w/RVR/Acute systolic CHF: Fluid overload due to A fib and managed with HD.              --Duo nebs prn added   for SOB? 1500 cc FR/monitor weights daily.              --monitor HR TID--on amiodarone taper. Continue metoprolol Filed Weights   11/06/22 1340 11/06/22 1729  Weight: 55.9 kg 54.5 kg     9. HTN: Monitor BP TID--on metoprolol bid.  Monitor with mobility  9/18 controlled  9/21- BP somewhat elevated 150s- will monitor for trend- had been controlled.  10. ESRD: HD MWF at the end of the day to help with tolerance of therapy             --hx of intradialysis hypotension (midodrine in the past)   -AVF ok per vascular surgery 11. Pancreatic insufficiency s/p Whipple: Continue Creon with meals  BID ac             --has dumping syndrome (abdominal pain with meals followed by diarrhea at times) 12. Left pubic rami/Sacral Ala at S4: WBAT LLE per Dr. Thad Ranger. Follow up Xrays in 2 weeks. 13. L-renal mass (likely RCC) urine leak s/p chronic nephrostomy tube: Tube exchanged out on 09/03 by IR per protocol              --not on AC due to bleeding from nephrostomy tube/kidney.  Measure output daily.   14. T2DM diet controlled w/peripheral neuropathy: Monitor BS ac/hs and use SSI for elevated BS.  --Hgb A1C- 6.1. will add CM restrictions.  9/21- CBGs running 87-183- con't regimen for now 15. Likely BPPV--  some improvement               --an orthostatic component also      -  meclizine  change to prn    16. Anemia of CKD: Monitor with serial checks. H/H stable in 10 range.    17. H/o Shingles w/optic nerve involvement: Continue Valtrex.  18. Restless leg syndrome: Continue Requip 0.5 mg HS with additional 0.25 mg prn during the day.     19. Bradycardia: resolved, continue to monitor HR TID  20. Orthostasis: continue teds with therapy only   -resting bp's better with activity.   22. L forearm swelling and redness-  stable  -continue to elevate LUE as possible 23. Chronic intermittent cough  9/21- will add Tessalon pearls 100 mg TID for cough-    I spent a total of  35  minutes on total care today- >50% coordination of care- due to d/w staff about her participation- and d/w staff about cough  LOS: 10 days A FACE TO FACE EVALUATION WAS PERFORMED  Kim Burns 11/09/2022, 12:11 PM

## 2022-11-09 NOTE — Progress Notes (Signed)
Speech Language Pathology TBI Note  Patient Details  Name: MANEKA BALTZELL MRN: [id number] Date of Birth: 1935/09/26  Today's Date: 11/09/2022 SLP Individual Time: 1502-1600 SLP Individual Time Calculation (min): 58 min  Short Term Goals: Week 2: SLP Short Term Goal 1 (Week 2): STGs=LTGs due to ELOS  Skilled Therapeutic Interventions: Pt seen for skilled ST with focus on cognitive communication goals, pt in bed and agreeable to therapeutic tasks. Pt requiring min A cues and extra time to remember events of the morning, initially denying any other therapies then stating "I guess I walked around the room a little". Pt became more cognitively clear as session continued, able to detail events leading up to and through hospitalization. SLP facilitating mildly complex problem ID/solution generating task with focus on discharge environment by providing min A cues for 80% accuracy. Pt is agreeable to d/c to son's home for a few weeks but continues to desire to live independently. Pt completing fall prevention task with overall min A cues to recall current deficits and risk factors. Pt left in bed with alarm set and all needs met, cont ST POC.   Pain Pain Assessment Pain Scale: 0-10 Pain Score: 0-No pain  Agitated Behavior Scale: TBI Observation Details Observation Environment: CIR Start of observation period - Date: 11/09/22 Start of observation period - Time: 1500 End of observation period - Date: 11/09/22 End of observation period - Time: 1600 Agitated Behavior Scale (DO NOT LEAVE BLANKS) Short attention span, easy distractibility, inability to concentrate: Present to a slight degree Impulsive, impatient, low tolerance for pain or frustration: Absent Uncooperative, resistant to care, demanding: Absent Violent and/or threatening violence toward people or property: Absent Explosive and/or unpredictable anger: Absent Rocking, rubbing, moaning, or other self-stimulating behavior:  Absent Pulling at tubes, restraints, etc.: Absent Wandering from treatment areas: Absent Restlessness, pacing, excessive movement: Absent Repetitive behaviors, motor, and/or verbal: Absent Rapid, loud, or excessive talking: Absent Sudden changes of mood: Absent Easily initiated or excessive crying and/or laughter: Absent Self-abusiveness, physical and/or verbal: Absent Agitated behavior scale total score: 15  Therapy/Group: Individual Therapy  Tacey Ruiz 11/09/2022, 3:18 PM

## 2022-11-09 NOTE — Progress Notes (Signed)
Physical Therapy Session Note  Patient Details  Name: Kim Burns MRN: [id number] Date of Birth: 02/20/1935  Today's Date: 11/09/2022 PT Individual Time: 1015-1100 PT Individual Time Calculation (min): 45 min   Short Term Goals: Week 1:  PT Short Term Goal 1 (Week 1): Pt will perform bed mobility with CGA. PT Short Term Goal 1 - Progress (Week 1): Progressing toward goal PT Short Term Goal 2 (Week 1): Pt will perform sit to stand with CGA. PT Short Term Goal 2 - Progress (Week 1): Progressing toward goal PT Short Term Goal 3 (Week 1): PT will perform bed to chair with CGA consistently. PT Short Term Goal 3 - Progress (Week 1): Progressing toward goal PT Short Term Goal 4 (Week 1): Pt will ambulate x50' with CGA and LRAD. PT Short Term Goal 4 - Progress (Week 1): Met  Skilled Therapeutic Interventions/Progress Updates:  Pt was seen bedside in the am. Pt sleeping but willing to participate with therapy. Pt donned B thigh high TED hose. Pt transferred supine to edge of bed with min A and verbal cues. Pt performed multiple sit to stand and stand pivot transfers with rolling walker and min A. Pt ambulated 150 feet x 2 and 95 feet with rolling walker and contact guard. Pt ascended/descended 4 stairs with B rails and contact guard. Pt returned  to room following treatment. Pt transferred edge of bed to supine with min to mod A and verbal cues. Doffed thigh high TED hoses. Pt left sitting up in bed with bed alarm on and call bell within reach.   Therapy Documentation Precautions:  Precautions Precautions: Fall, Other (comment) Precaution Comments: L lower abdominal drain, vertigo, orthostatic hypotension Restrictions Weight Bearing Restrictions: Yes LLE Weight Bearing: Weight bearing as tolerated General:   Pain: No c/o pain  Therapy/Group: Individual Therapy  Rayford Halsted 11/09/2022, 12:44 PM

## 2022-11-09 NOTE — Plan of Care (Signed)
Problem: Consults Goal: RH STROKE PATIENT EDUCATION Description: See Patient Education module for education specifics  Outcome: Progressing   Problem: RH BOWEL ELIMINATION Goal: RH STG MANAGE BOWEL WITH ASSISTANCE Description: STG Manage Bowel with toileting Assistance. Outcome: Progressing Goal: RH STG MANAGE BOWEL W/MEDICATION W/ASSISTANCE Description: STG Manage Bowel with Medication with mod I Assistance. Outcome: Progressing   Problem: RH SKIN INTEGRITY Goal: RH STG SKIN FREE OF INFECTION/BREAKDOWN Description: Manage skin with min assist Outcome: Progressing Goal: RH STG MAINTAIN SKIN INTEGRITY WITH ASSISTANCE Description: STG Maintain Skin Integrity With min Assistance. Outcome: Progressing   Problem: RH SAFETY Goal: RH STG ADHERE TO SAFETY PRECAUTIONS W/ASSISTANCE/DEVICE Description: STG Adhere to Safety Precautions With cues Assistance/Device. Outcome: Progressing   Problem: RH KNOWLEDGE DEFICIT Goal: RH STG INCREASE KNOWLEDGE OF HYPERTENSION Description: Patient and family will be able to manage HTN/Syncope with medication and dietary modification using educational resources independently Outcome: Progressing Goal: RH STG INCREASE KNOWLEDGE OF STROKE PROPHYLAXIS Description: Patient and family will be able to manage secondary risks with medication and dietary modification using educational resources independently Outcome: Progressing   Problem: Education: Goal: Knowledge of General Education information will improve Description: Including pain rating scale, medication(s)/side effects and non-pharmacologic comfort measures Outcome: Progressing   Problem: Health Behavior/Discharge Planning: Goal: Ability to manage health-related needs will improve Outcome: Progressing   Problem: Clinical Measurements: Goal: Ability to maintain clinical measurements within normal limits will improve Outcome: Progressing Goal: Will remain free from infection Outcome:  Progressing Goal: Diagnostic test results will improve Outcome: Progressing Goal: Respiratory complications will improve Outcome: Progressing Goal: Cardiovascular complication will be avoided Outcome: Progressing   Problem: Activity: Goal: Risk for activity intolerance will decrease Outcome: Progressing   Problem: Nutrition: Goal: Adequate nutrition will be maintained Outcome: Progressing   Problem: Coping: Goal: Level of anxiety will decrease Outcome: Progressing   Problem: Elimination: Goal: Will not experience complications related to bowel motility Outcome: Progressing Goal: Will not experience complications related to urinary retention Outcome: Progressing   Problem: Pain Managment: Goal: General experience of comfort will improve Outcome: Progressing

## 2022-11-09 NOTE — Progress Notes (Addendum)
[facility] Progress Note   Subjective: C/O cough. Ordered mucinex PRN cough.    Objective Vitals:   11/08/22 1848 11/08/22 2002 11/09/22 0338 11/09/22 0829  BP: 130/68 127/72 (!) 165/80 (!) 151/68  Pulse: (!) 58 (!) 56 66 62  Resp: (!) 24 18 18 18   Temp: 97.8 F (36.6 C) 98.3 F (36.8 C) 97.7 F (36.5 C) 97.7 F (36.5 C)  TempSrc:  Oral Oral Oral  SpO2: 99% 100% 99% 100%  Weight:      Height:       Physical Exam General: Frail elderly female in NAD Heart: S1,S2 RRR Lungs: CTAB A/P decreased in bases Abdomen: NABS NT. Nephrostomy tube to drain.  Extremities:No LE edema Dialysis Access: L AVF + T/B  Additional Objective Labs: Basic Metabolic Panel: Recent Labs  Lab 11/04/22 1159 11/07/22 0054  NA 130* 130*  K 4.2 3.8  CL 95* 94*  CO2 24 27  GLUCOSE 154* 125*  BUN 48* 24*  CREATININE 4.08* 2.42*  CALCIUM 8.8* 8.4*  PHOS 4.1  --    Liver Function Tests: Recent Labs  Lab 11/04/22 1159  ALBUMIN 2.1*   No results for input(s): "LIPASE", "AMYLASE" in the last 168 hours. CBC: Recent Labs  Lab 11/04/22 1159  WBC 10.2  HGB 11.3*  HCT 34.9*  MCV 95.4  PLT 264   Blood Culture    Component Value Date/Time   SDES BLOOD RIGHT ANTECUBITAL 01/22/2022 0038   SPECREQUEST  01/22/2022 0038    BOTTLES DRAWN AEROBIC AND ANAEROBIC Blood Culture results may not be optimal due to an inadequate volume of blood received in culture bottles   CULT  01/22/2022 0038    NO GROWTH 5 DAYS Performed at [facility], [address], [place], [place] [address]    REPTSTATUS 01/27/2022 FINAL 01/22/2022 0038    Cardiac Enzymes: No results for input(s): "CKTOTAL", "CKMB", "CKMBINDEX", "TROPONINI" in the last 168 hours. CBG: Recent Labs  Lab 11/07/22 2042 11/08/22 0611 11/08/22 1141 11/09/22 0605 11/09/22 1116  GLUCAP 183* 87 103* 112* 161*   Iron Studies: No results for input(s): "IRON", "TIBC", "TRANSFERRIN", "FERRITIN" in the last 72  hours. @lablastinr3 @ Studies/Results: No results found. Medications:   (feeding supplement) PROSource Plus  30 mL Oral BID BM   acetaminophen  500 mg Oral TID   amantadine  100 mg Oral Weekly   amiodarone  200 mg Oral Daily   aspirin  81 mg Oral Daily   Chlorhexidine Gluconate Cloth  6 each Topical Q0600   doxercalciferol  6 mcg Intravenous Q M,W,F-HD   guaiFENesin  1,200 mg Oral BID   insulin aspart  0-5 Units Subcutaneous QHS   insulin aspart  0-6 Units Subcutaneous TID WC   lidocaine  1 patch Transdermal Q24H   lipase/protease/amylase  36,000 Units Oral BID WC   metoprolol succinate  25 mg Oral Daily   rOPINIRole  0.5 mg Oral QHS   sevelamer carbonate  800 mg Oral TID WC   valACYclovir  500 mg Oral QHS     Dialysis Orders: MWF at [facility] 3.5hr, 350/A1.5, EDW 55kg, 2K/2C bath, L AVF, no heparin - Mircera was on hold at time of admit - Hectoral IV q HD   Assessment/Plan: Head injury/fall from bed -> R frontal hemorrhagic contusion, R frontal ischemic CVA, small B SDHs, L posterior scalp hematoma, non-displaced L parietal-occipital bone fracture: Now in CIR ESRD: Continue HD on usual MWF schedule -> next HD 9/20 HTN/volume: BP  fine but ongoing L arm and BLE edema on exam. UF as tolerated. Currently under OP EDW.  Dialysis access: Worsening LUE edema raising concern for stenosis.  Less concerning for infection. Appreciate VVS evaluation - no need for intervention at this time.  Anemia of ESRD: Hgb 10.8, no ESA at this time. Secondary hyperparathyroidism:  CorrCa/Phos to goal - continue Renvela + VDRA. Nutrition: Alb low, continue supplements.  Kim Burns H. Leisha Trinkle NP-C 11/09/2022, 11:46 AM  [employer] [phone]

## 2022-11-10 LAB — GLUCOSE, CAPILLARY
Glucose-Capillary: 112 mg/dL — ABNORMAL HIGH (ref 70–99)
Glucose-Capillary: 126 mg/dL — ABNORMAL HIGH (ref 70–99)
Glucose-Capillary: 186 mg/dL — ABNORMAL HIGH (ref 70–99)
Glucose-Capillary: 186 mg/dL — ABNORMAL HIGH (ref 70–99)

## 2022-11-10 LAB — HEPATITIS B SURFACE ANTIGEN: Hepatitis B Surface Ag: NONREACTIVE

## 2022-11-10 NOTE — Progress Notes (Signed)
[facility] Progress Note   Subjective: No new complaints. HD tomorrow on schedule.   Objective Vitals:   11/10/22 0540 11/10/22 0542 11/10/22 0544 11/10/22 0549  BP: (!) 153/78 119/64 121/67 (!) 156/74  Pulse: 60 64 63 (!) 59  Resp:    18  Temp:    97.7 F (36.5 C)  TempSrc:    Oral  SpO2:    97%  Weight:      Height:       Physical Exam General: Frail elderly female in NAD Heart: S1,S2 RRR Lungs: CTAB A/P decreased in bases Abdomen: NABS NT. Nephrostomy tube to drain.  Extremities:No LE edema Dialysis Access: L AVF + T/B    Additional Objective Labs: Basic Metabolic Panel: Recent Labs  Lab 11/04/22 1159 11/07/22 0054  NA 130* 130*  K 4.2 3.8  CL 95* 94*  CO2 24 27  GLUCOSE 154* 125*  BUN 48* 24*  CREATININE 4.08* 2.42*  CALCIUM 8.8* 8.4*  PHOS 4.1  --    Liver Function Tests: Recent Labs  Lab 11/04/22 1159  ALBUMIN 2.1*   No results for input(s): "LIPASE", "AMYLASE" in the last 168 hours. CBC: Recent Labs  Lab 11/04/22 1159  WBC 10.2  HGB 11.3*  HCT 34.9*  MCV 95.4  PLT 264   Blood Culture    Component Value Date/Time   SDES BLOOD RIGHT ANTECUBITAL 01/22/2022 0038   SPECREQUEST  01/22/2022 0038    BOTTLES DRAWN AEROBIC AND ANAEROBIC Blood Culture results may not be optimal due to an inadequate volume of blood received in culture bottles   CULT  01/22/2022 0038    NO GROWTH 5 DAYS Performed at [facility], [address], [place], [place] [address]    REPTSTATUS 01/27/2022 FINAL 01/22/2022 0038    Cardiac Enzymes: No results for input(s): "CKTOTAL", "CKMB", "CKMBINDEX", "TROPONINI" in the last 168 hours. CBG: Recent Labs  Lab 11/09/22 0605 11/09/22 1116 11/09/22 1617 11/09/22 2106 11/10/22 0623  GLUCAP 112* 161* 156* 155* 112*   Iron Studies: No results for input(s): "IRON", "TIBC", "TRANSFERRIN", "FERRITIN" in the last 72 hours. @lablastinr3 @ Studies/Results: No results found. Medications:   (feeding  supplement) PROSource Plus  30 mL Oral BID BM   acetaminophen  500 mg Oral TID   amantadine  100 mg Oral Weekly   amiodarone  200 mg Oral Daily   aspirin  81 mg Oral Daily   benzonatate  100 mg Oral TID   Chlorhexidine Gluconate Cloth  6 each Topical Q0600   doxercalciferol  6 mcg Intravenous Q M,W,F-HD   guaiFENesin  1,200 mg Oral BID   insulin aspart  0-5 Units Subcutaneous QHS   insulin aspart  0-6 Units Subcutaneous TID WC   lidocaine  1 patch Transdermal Q24H   lipase/protease/amylase  36,000 Units Oral BID WC   metoprolol succinate  25 mg Oral Daily   rOPINIRole  0.5 mg Oral QHS   sevelamer carbonate  800 mg Oral TID WC   valACYclovir  500 mg Oral QHS     Dialysis Orders: MWF at [facility] 3.5hr, 350/A1.5, EDW 55kg, 2K/2C bath, L AVF - no heparin - Mircera was on hold at time of admit - Hectoral 6 mcg IV q HD   Assessment/Plan: Head injury/fall from bed -> R frontal hemorrhagic contusion, R frontal ischemic CVA, small B SDHs, L posterior scalp hematoma, non-displaced L parietal-occipital bone fracture: Now in CIR ESRD: Continue HD on usual MWF schedule - next HD 11/11/2022. Labs  with HD.  HTN/volume: BP fine but ongoing L arm and BLE edema on exam. UF as tolerated. Currently under OP EDW.  Dialysis access: Worsening LUE edema raising concern for stenosis.  Less concerning for infection. Appreciate VVS evaluation - no need for intervention at this time.  Anemia of ESRD: Hgb 11.3, no ESA at this time. Secondary hyperparathyroidism:  CorrCa/Phos to goal - continue Renvela + VDRA. Nutrition: Alb low, continue supplements.  Gracianna Vink H. Yashira Offenberger NP-C 11/10/2022, 10:14 AM  [employer] [phone]

## 2022-11-10 NOTE — Progress Notes (Signed)
PROGRESS NOTE   Subjective/Complaints:   Pt reports a frontal HA- "not new"- wants tylenol.  Cough better- didn't remember was coughing.   LBM x2 this AM.   ROS:   Pt denies SOB, abd pain, CP, N/V/C/D, and vision changes   Objective:   No results found. No results for input(s): "WBC", "HGB", "HCT", "PLT" in the last 72 hours.  No results for input(s): "NA", "K", "CL", "CO2", "GLUCOSE", "BUN", "CREATININE", "CALCIUM" in the last 72 hours.   Intake/Output Summary (Last 24 hours) at 11/10/2022 1410 Last data filed at 11/10/2022 1300 Gross per 24 hour  Intake 593 ml  Output 55 ml  Net 538 ml     Pressure Injury 10/21/22 Buttocks Right Deep Tissue Pressure Injury - Purple or maroon localized area of discolored intact skin or blood-filled blister due to damage of underlying soft tissue from pressure and/or shear. 2 areas of deep tissue pressure in (Active)  10/21/22 1145  Location: Buttocks  Location Orientation: Right  Staging: Deep Tissue Pressure Injury - Purple or maroon localized area of discolored intact skin or blood-filled blister due to damage of underlying soft tissue from pressure and/or shear.  Wound Description (Comments): 2 areas of deep tissue pressure injuries  Present on Admission: Yes    Physical Exam: Vital Signs Blood pressure 132/63, pulse (!) 58, temperature 98.6 F (37 C), resp. rate 18, height 5\' 3"  (1.6 m), weight 54.5 kg, SpO2 98%.       General: awake, alert, appropriate, sitting up in bedside chair- ordering meals;  NAD HENT: conjugate gaze; oropharynx moist CV: regular to borderline bradycardic  rate; no JVD Pulmonary: CTA B/L; no W/R/R- good air movement GI: soft, NT, ND, (+)BS Psychiatric: appropriate- vague Neurological: alert  Skin: Left arm swelling, crepey skin with redness/bruising below AVF--sl improved.  Right buttock blister. Nephrostomy tube/drain in place.  Neuro:   awake, oriented.   Fair to functional Memory. Normal language and speech. Cranial nerve exam unremarkable. MMT: RUE 4/5, LUE 4/5. RLE 3/5 prox to 4/5 distally. LLE 1/5 hip to 4/5 ADF/PF. Sensory exam normal for light touch and pain in all 4 limbs. No limb ataxia. Did not see nystagmus but I didn't turn her head either  Musculoskeletal: Left hip tender with palpation and attempts at PROM. Left chest wall remains sore too    Assessment/Plan: 1. Functional deficits which require 3+ hours per day of interdisciplinary therapy in a comprehensive inpatient rehab setting. Physiatrist is providing close team supervision and 24 hour management of active medical problems listed below. Physiatrist and rehab team continue to assess barriers to discharge/monitor patient progress toward functional and medical goals  Care Tool:  Bathing    Body parts bathed by patient: Right arm, Chest, Left arm, Abdomen, Front perineal area, Buttocks, Face, Right upper leg, Left upper leg   Body parts bathed by helper: Right lower leg, Left lower leg     Bathing assist Assist Level: Moderate Assistance - Patient 50 - 74%     Upper Body Dressing/Undressing Upper body dressing   What is the patient wearing?: Pull over shirt    Upper body assist Assist Level: Contact Guard/Touching assist  Lower Body Dressing/Undressing Lower body dressing      What is the patient wearing?: Pants, Incontinence brief     Lower body assist Assist for lower body dressing: Moderate Assistance - Patient 50 - 74%     Toileting Toileting Toileting Activity did not occur Press photographer and hygiene only): N/A (no void or bm)  Toileting assist Assist for toileting: Minimal Assistance - Patient > 75%     Transfers Chair/bed transfer  Transfers assist  Chair/bed transfer activity did not occur: Safety/medical concerns (due to dizziness)  Chair/bed transfer assist level: Minimal Assistance - Patient > 75%      Locomotion Ambulation   Ambulation assist      Assist level: Contact Guard/Touching assist Assistive device: Walker-rolling Max distance: 150   Walk 10 feet activity   Assist     Assist level: Contact Guard/Touching assist Assistive device: Walker-rolling   Walk 50 feet activity   Assist Walk 50 feet with 2 turns activity did not occur: Safety/medical concerns  Assist level: Contact Guard/Touching assist Assistive device: Walker-rolling    Walk 150 feet activity   Assist Walk 150 feet activity did not occur: Safety/medical concerns  Assist level: Contact Guard/Touching assist Assistive device: Walker-rolling    Walk 10 feet on uneven surface  activity   Assist     Assist level: Minimal Assistance - Patient > 75% Assistive device: Other (comment) (2 handrails)   Wheelchair     Assist Is the patient using a wheelchair?: Yes Type of Wheelchair: Manual    Wheelchair assist level: Dependent - Patient 0% Max wheelchair distance: 150'    Wheelchair 50 feet with 2 turns activity    Assist        Assist Level: Dependent - Patient 0%   Wheelchair 150 feet activity     Assist          Blood pressure 132/63, pulse (!) 58, temperature 98.6 F (37 C), resp. rate 18, height 5\' 3"  (1.6 m), weight 54.5 kg, SpO2 98%.  Medical Problem List and Plan: 1. Functional deficits secondary to TBI and polytrauma due to a fall with associated Croop contrecoup brain injury, right frontal lobe hemorrhagic contusion and right frontal ischemic nonhemorrhagic infarct.             -patient may shower             -ELOS/Goals: extending stay to 9/24 potentially, PT/OT/SLP mod I to supervision             -Continue CIR therapies including PT, OT, and SLP. Interdisciplinary team conference today to discuss goals, barriers to discharge, and dc planning.    -pt's vertigo subjectively better. Fatigue and pain also barriers. Encouraged her to work through.   Pt  still letting vertigo impair her function  Con't CIR PT, OT and SLP 2.  Antithrombotics: -DVT/anticoagulation:  Mechanical: Sequential compression devices, below knee Bilateral lower extremities             -antiplatelet therapy: ASA 3. Pain Management: Tylenol prn 9/22- having HA- given tylenol .  4. Mood/Behavior/Sleep: LCSW to follow for evaluation and support.              -antipsychotic agents: N/A             --chronic insomnia. Napping during day creates a problem   9/19 continue trial of amantadine 100mg  weekly (ESRD) after discussion with pharmacy 5. Neuropsych/cognition: This patient is not fully capable of making decisions on her own  behalf. 6. Skin/Wound Care: Routine pressure relief measures.  7. Fluids/Electrolytes/Nutrition: Strict I/O. Change to renal diet. Continue 1500 cc FR. -hypokalemia--per renal team for replacement  8. A fib w/RVR/Acute systolic CHF: Fluid overload due to A fib and managed with HD.              --Duo nebs prn added   for SOB? 1500 cc FR/monitor weights daily.              --monitor HR TID--on amiodarone taper. Continue metoprolol  9/22- last weight 4 days ago- will reorder daily weights Filed Weights   11/06/22 1340 11/06/22 1729  Weight: 55.9 kg 54.5 kg     9. HTN: Monitor BP TID--on metoprolol bid.  Monitor with mobility  9/18 controlled  9/21- BP somewhat elevated 150s- will monitor for trend- had been controlled. 9/22- BP 119-156 systolic- will con't to monitor  10. ESRD: HD MWF at the end of the day to help with tolerance of therapy             --hx of intradialysis hypotension (midodrine in the past)   -AVF ok per vascular surgery 11. Pancreatic insufficiency s/p Whipple: Continue Creon with meals  BID ac             --has dumping syndrome (abdominal pain with meals followed by diarrhea at times) 12. Left pubic rami/Sacral Ala at S4: WBAT LLE per Dr. Thad Ranger. Follow up Xrays in 2 weeks. 13. L-renal mass (likely RCC) urine leak s/p  chronic nephrostomy tube: Tube exchanged out on 09/03 by IR per protocol             --not on AC due to bleeding from nephrostomy tube/kidney.  Measure output daily.   14. T2DM diet controlled w/peripheral neuropathy: Monitor BS ac/hs and use SSI for elevated BS.  --Hgb A1C- 6.1. will add CM restrictions.  9/21- CBGs running 87-183- con't regimen for now 15. Likely BPPV--  some improvement               --an orthostatic component also      -  meclizine  change to prn  9/22- still needs a lot of encouragement to work with therapy- con't to monitor- still having Nausea and vertigo  16. Anemia of CKD: Monitor with serial checks. H/H stable in 10 range.    17. H/o Shingles w/optic nerve involvement: Continue Valtrex.  18. Restless leg syndrome: Continue Requip 0.5 mg HS with additional 0.25 mg prn during the day.     19. Bradycardia: resolved, continue to monitor HR TID  20. Orthostasis: continue teds with therapy only   -resting bp's better with activity.   22. L forearm swelling and redness-  stable  -continue to elevate LUE as possible 23. Chronic intermittent cough  9/21- will add Tessalon pearls 100 mg TID for cough-   9/22- no cough today or overnight per nursing and pt 24. HA  9/22- given tylenol- denies additional pain  I spent a total of  35  minutes on total care today- >50% coordination of care- due to  D/w nursing- about HA and coughing   LOS: 11 days A FACE TO FACE EVALUATION WAS PERFORMED  Stephenia Vogan 11/10/2022, 2:10 PM

## 2022-11-10 NOTE — Plan of Care (Signed)
  Problem: Consults Goal: RH STROKE PATIENT EDUCATION Description: See Patient Education module for education specifics  Outcome: Progressing   Problem: RH BOWEL ELIMINATION Goal: RH STG MANAGE BOWEL WITH ASSISTANCE Description: STG Manage Bowel with toileting Assistance. Outcome: Progressing Goal: RH STG MANAGE BOWEL W/MEDICATION W/ASSISTANCE Description: STG Manage Bowel with Medication with mod I Assistance. Outcome: Progressing   Problem: RH SKIN INTEGRITY Goal: RH STG SKIN FREE OF INFECTION/BREAKDOWN Description: Manage skin with min assist Outcome: Progressing Goal: RH STG MAINTAIN SKIN INTEGRITY WITH ASSISTANCE Description: STG Maintain Skin Integrity With min Assistance. Outcome: Progressing   Problem: RH SAFETY Goal: RH STG ADHERE TO SAFETY PRECAUTIONS W/ASSISTANCE/DEVICE Description: STG Adhere to Safety Precautions With cues Assistance/Device. Outcome: Progressing   Problem: RH KNOWLEDGE DEFICIT Goal: RH STG INCREASE KNOWLEDGE OF HYPERTENSION Description: Patient and family will be able to manage HTN/Syncope with medication and dietary modification using educational resources independently Outcome: Progressing Goal: RH STG INCREASE KNOWLEDGE OF STROKE PROPHYLAXIS Description: Patient and family will be able to manage secondary risks with medication and dietary modification using educational resources independently Outcome: Progressing   Problem: Education: Goal: Knowledge of General Education information will improve Description: Including pain rating scale, medication(s)/side effects and non-pharmacologic comfort measures Outcome: Progressing   Problem: Health Behavior/Discharge Planning: Goal: Ability to manage health-related needs will improve Outcome: Progressing   Problem: Clinical Measurements: Goal: Ability to maintain clinical measurements within normal limits will improve Outcome: Progressing Goal: Will remain free from infection Outcome:  Progressing Goal: Diagnostic test results will improve Outcome: Progressing Goal: Respiratory complications will improve Outcome: Progressing Goal: Cardiovascular complication will be avoided Outcome: Progressing   Problem: Activity: Goal: Risk for activity intolerance will decrease Outcome: Progressing   Problem: Nutrition: Goal: Adequate nutrition will be maintained Outcome: Progressing   Problem: Coping: Goal: Level of anxiety will decrease Outcome: Progressing   Problem: Elimination: Goal: Will not experience complications related to bowel motility Outcome: Progressing Goal: Will not experience complications related to urinary retention Outcome: Progressing   Problem: Pain Managment: Goal: General experience of comfort will improve Outcome: Progressing   Problem: Safety: Goal: Ability to remain free from injury will improve Outcome: Progressing   Problem: Skin Integrity: Goal: Risk for impaired skin integrity will decrease Outcome: Progressing

## 2022-11-11 LAB — GLUCOSE, CAPILLARY
Glucose-Capillary: 90 mg/dL (ref 70–99)
Glucose-Capillary: 120 mg/dL — ABNORMAL HIGH (ref 70–99)
Glucose-Capillary: 91 mg/dL (ref 70–99)

## 2022-11-11 LAB — CBC
HCT: 34.7 % — ABNORMAL LOW (ref 36.0–46.0)
Hemoglobin: 11.1 g/dL — ABNORMAL LOW (ref 12.0–15.0)
MCH: 30.8 pg (ref 26.0–34.0)
MCHC: 32 g/dL (ref 30.0–36.0)
MCV: 96.4 fL (ref 80.0–100.0)
Platelets: 191 10*3/uL (ref 150–400)
RBC: 3.6 MIL/uL — ABNORMAL LOW (ref 3.87–5.11)
RDW: 16.5 % — ABNORMAL HIGH (ref 11.5–15.5)
WBC: 10.3 10*3/uL (ref 4.0–10.5)
nRBC: 0 % (ref 0.0–0.2)

## 2022-11-11 LAB — RENAL FUNCTION PANEL
Albumin: 2.1 g/dL — ABNORMAL LOW (ref 3.5–5.0)
Anion gap: 13 (ref 5–15)
BUN: 49 mg/dL — ABNORMAL HIGH (ref 8–23)
CO2: 22 mmol/L (ref 22–32)
Calcium: 9 mg/dL (ref 8.9–10.3)
Chloride: 94 mmol/L — ABNORMAL LOW (ref 98–111)
Creatinine, Ser: 4.03 mg/dL — ABNORMAL HIGH (ref 0.44–1.00)
GFR, Estimated: 10 mL/min — ABNORMAL LOW (ref >60)
Glucose, Bld: 142 mg/dL — ABNORMAL HIGH (ref 70–99)
Phosphorus: 6.1 mg/dL — ABNORMAL HIGH (ref 2.5–4.6)
Potassium: 4.3 mmol/L (ref 3.5–5.1)
Sodium: 129 mmol/L — ABNORMAL LOW (ref 135–145)

## 2022-11-11 MED ORDER — PENTAFLUOROPROP-TETRAFLUOROETH EX AERO: 1.0000 | INHALATION_SPRAY | CUTANEOUS | Status: DC | PRN

## 2022-11-11 MED ORDER — LIDOCAINE-PRILOCAINE 2.5-2.5 % EX CREA: 1.0000 | TOPICAL_CREAM | CUTANEOUS | Status: DC | PRN

## 2022-11-11 MED ORDER — HEPARIN SODIUM (PORCINE) 1000 UNIT/ML DIALYSIS: 1000.0000 [IU] | INTRAMUSCULAR | Status: DC | PRN

## 2022-11-11 MED ORDER — LIDOCAINE HCL (PF) 1 % IJ SOLN: 5.0000 mL | INTRAMUSCULAR | Status: DC | PRN

## 2022-11-11 NOTE — Progress Notes (Signed)
PROGRESS NOTE   Subjective/Complaints:   Up early this morning with PT. "Worn out" afterwards. No new issues this morning  ROS: Patient denies fever, rash, sore throat, blurred vision, dizziness, nausea, vomiting, diarrhea, cough, shortness of breath or chest pain, joint or back/neck pain, headache, or mood change.    Objective:   No results found. No results for input(s): "WBC", "HGB", "HCT", "PLT" in the last 72 hours.  No results for input(s): "NA", "K", "CL", "CO2", "GLUCOSE", "BUN", "CREATININE", "CALCIUM" in the last 72 hours.   Intake/Output Summary (Last 24 hours) at 11/11/2022 1126 Last data filed at 11/11/2022 0500 Gross per 24 hour  Intake 476 ml  Output --  Net 476 ml     Pressure Injury 10/21/22 Buttocks Right Deep Tissue Pressure Injury - Purple or maroon localized area of discolored intact skin or blood-filled blister due to damage of underlying soft tissue from pressure and/or shear. 2 areas of deep tissue pressure in (Active)  10/21/22 1145  Location: Buttocks  Location Orientation: Right  Staging: Deep Tissue Pressure Injury - Purple or maroon localized area of discolored intact skin or blood-filled blister due to damage of underlying soft tissue from pressure and/or shear.  Wound Description (Comments): 2 areas of deep tissue pressure injuries  Present on Admission: Yes    Physical Exam: Vital Signs Blood pressure (!) 189/88, pulse 62, temperature 97.6 F (36.4 C), resp. rate 18, height 5\' 3"  (1.6 m), weight 54.5 kg, SpO2 100%.       Constitutional: No distress . Vital signs reviewed. HEENT: NCAT, EOMI, oral membranes moist Neck: supple Cardiovascular: RRR without murmur. No JVD    Respiratory/Chest: CTA Bilaterally without wheezes or rales. Normal effort    GI/Abdomen: BS +, nephrostomy tube in place Ext: no clubbing, cyanosis, or edema Psych: pleasant and cooperative  Skin: Left arm  swelling, crepey skin with redness/bruising below AVF--sl improved.  Right buttock blister. Nephrostomy tube/drain in place.  Neuro:  fatigued, slow to awaken.   Fair to functional Memory. Normal language and speech. Cranial nerve exam unremarkable. MMT: RUE 4/5, LUE 4/5. RLE 3/5 prox to 4/5 distally. LLE 1/5 hip to 4/5 ADF/PF. Sensory exam normal for light touch and pain in all 4 limbs. No obvious nystagmus Musculoskeletal: Left hip tender with palpation and with movement.     Assessment/Plan: 1. Functional deficits which require 3+ hours per day of interdisciplinary therapy in a comprehensive inpatient rehab setting. Physiatrist is providing close team supervision and 24 hour management of active medical problems listed below. Physiatrist and rehab team continue to assess barriers to discharge/monitor patient progress toward functional and medical goals  Care Tool:  Bathing    Body parts bathed by patient: Right arm, Chest, Left arm, Abdomen, Front perineal area, Buttocks, Face, Right upper leg, Left upper leg   Body parts bathed by helper: Right lower leg, Left lower leg     Bathing assist Assist Level: Moderate Assistance - Patient 50 - 74%     Upper Body Dressing/Undressing Upper body dressing   What is the patient wearing?: Pull over shirt    Upper body assist Assist Level: Contact Guard/Touching assist    Lower Body  Dressing/Undressing Lower body dressing      What is the patient wearing?: Pants, Incontinence brief     Lower body assist Assist for lower body dressing: Moderate Assistance - Patient 50 - 74%     Toileting Toileting Toileting Activity did not occur Press photographer and hygiene only): N/A (no void or bm)  Toileting assist Assist for toileting: Minimal Assistance - Patient > 75%     Transfers Chair/bed transfer  Transfers assist  Chair/bed transfer activity did not occur: Safety/medical concerns (due to dizziness)  Chair/bed transfer assist  level: Contact Guard/Touching assist     Locomotion Ambulation   Ambulation assist      Assist level: Contact Guard/Touching assist Assistive device: Walker-rolling Max distance: 75'   Walk 10 feet activity   Assist     Assist level: Contact Guard/Touching assist Assistive device: Walker-rolling   Walk 50 feet activity   Assist Walk 50 feet with 2 turns activity did not occur: Safety/medical concerns  Assist level: Contact Guard/Touching assist Assistive device: Walker-rolling    Walk 150 feet activity   Assist Walk 150 feet activity did not occur: Safety/medical concerns  Assist level: Contact Guard/Touching assist Assistive device: Walker-rolling    Walk 10 feet on uneven surface  activity   Assist     Assist level: Minimal Assistance - Patient > 75% Assistive device: Other (comment) (2 handrails)   Wheelchair     Assist Is the patient using a wheelchair?: Yes Type of Wheelchair: Manual    Wheelchair assist level: Dependent - Patient 0% Max wheelchair distance: 150'    Wheelchair 50 feet with 2 turns activity    Assist        Assist Level: Dependent - Patient 0%   Wheelchair 150 feet activity     Assist          Blood pressure (!) 189/88, pulse 62, temperature 97.6 F (36.4 C), resp. rate 18, height 5\' 3"  (1.6 m), weight 54.5 kg, SpO2 100%.  Medical Problem List and Plan: 1. Functional deficits secondary to TBI and polytrauma due to a fall with associated Croop contrecoup brain injury, right frontal lobe hemorrhagic contusion and right frontal ischemic nonhemorrhagic infarct.             -patient may shower             -ELOS/Goals: extending stay to 9/24 potentially, PT/OT/SLP mod I to supervision             -Continue CIR therapies including PT, OT, and SLP. Interdisciplinary team conference today to discuss goals, barriers to discharge, and dc planning.    -pt's vertigo is better. Fatigue is a barrier. Encouraged her  to work through.   -Continue CIR therapies including PT, OT, SLP 2.  Antithrombotics: -DVT/anticoagulation:  Mechanical: Sequential compression devices, below knee Bilateral lower extremities             -antiplatelet therapy: ASA 3. Pain Management: Tylenol prn 9/23-   HA- given tylenol .  4. Mood/Behavior/Sleep: LCSW to follow for evaluation and support.              -antipsychotic agents: N/A             --chronic insomnia. Napping during day creates a problem   9/19 continue trial of amantadine 100mg  weekly (ESRD) after discussion with pharmacy 5. Neuropsych/cognition: This patient is not fully capable of making decisions on her own behalf. 6. Skin/Wound Care: Routine pressure relief measures.  7. Fluids/Electrolytes/Nutrition: Strict  I/O. Change to renal diet. Continue 1500 cc FR. -hypokalemia--per renal team for replacement  8. A fib w/RVR/Acute systolic CHF: Fluid overload due to A fib and managed with HD.              --Duo nebs prn added   for SOB? 1500 cc FR/monitor weights daily.              --monitor HR TID--on amiodarone taper. Continue metoprolol  9/22- last weight 4 days ago- will reorder daily weights Filed Weights     9. HTN: Monitor BP TID--on metoprolol bid.  Monitor with mobility  9/18 controlled  9/21- BP somewhat elevated 150s- will monitor for trend- had been controlled. 9/22- BP 119-156 systolic- will con't to monitor  10. ESRD: HD MWF at the end of the day to help with tolerance of therapy             --hx of intradialysis hypotension (midodrine in the past)   -AVF ok per vascular surgery 11. Pancreatic insufficiency s/p Whipple: Continue Creon with meals  BID ac             --has dumping syndrome (abdominal pain with meals followed by diarrhea at times) 12. Left pubic rami/Sacral Ala at S4: WBAT LLE per Dr. Thad Ranger. Follow up Xrays in 2 weeks. 13. L-renal mass (likely RCC) urine leak s/p chronic nephrostomy tube: Tube exchanged out on 09/03 by IR per  protocol             --not on AC due to bleeding from nephrostomy tube/kidney.  Measure output daily.   14. T2DM diet controlled w/peripheral neuropathy: Monitor BS ac/hs and use SSI for elevated BS.  --Hgb A1C- 6.1. will add CM restrictions.  CBG (last 3)  Recent Labs    11/10/22 1655 11/10/22 2125 11/11/22 0618  GLUCAP 186* 186* 90   9/23--continue diet control. Hold off on rx for now 15. Likely BPPV--  some improvement               --an orthostatic component also      -  meclizine  change to prn  9/23 still working through sx, but better  16. Anemia of CKD: Monitor with serial checks. H/H stable in 10 range.    17. H/o Shingles w/optic nerve involvement: Continue Valtrex.  18. Restless leg syndrome: Continue Requip 0.5 mg HS with additional 0.25 mg prn during the day.     19. Bradycardia: resolved, continue to monitor HR TID  20. Orthostasis: continue teds with therapy only   -resting bp's better with activity.   22. L forearm swelling and redness-  stable  -continue to elevate LUE as possible   LOS: 12 days A FACE TO FACE EVALUATION WAS PERFORMED  Ranelle Oyster 11/11/2022, 11:26 AM

## 2022-11-11 NOTE — Progress Notes (Signed)
Patient's BP 184/93, HR-66. Denies discomfort. Patient rested and BP recheck was 189/88, HR-62. Pt due for HD today in the afternoon. [address], [place] informed and no antihypertensive PRN in [facility]. RN inquired if AM dose of Toprol XL can be given which she gets daily even during days of HD. Ms. Casper Harrison agreed. Charge RN informed.

## 2022-11-11 NOTE — Progress Notes (Signed)
Physical Therapy TBI Note  Patient Details  Name: Kim Burns MRN: [id number] Date of Birth: [birth year]  Today's Date: 11/11/2022 PT Individual Time: 0823-0853 PT Individual Time Calculation (min): 30 min   Short Term Goals: Week 2:  PT Short Term Goal 1 (Week 2): = LTGs due to ELOS   Skilled Therapeutic Interventions/Progress Updates:    Pt presents in bed, agreeable to session but states she is really tired. Donned thigh high tedhose with total assist to prepare for OOB. Supervision with extra time to come to EOB using HOB elevated and bed rails for support. Sit > stand with light CGA to facilitate anterior weightshift with RW for support. Functional gait training x 40' with RW with CGA with flexed posture, decreased step length and slow cadence. Pt self limited distance due to fatigue stating her arms were tired. Transported to gym via w/c the rest of the way for energy conservation. Focused on stair negotiation training for d/c prep. Utilized 4" steps due to 6" steps in use by another patient. Pt able to go up/down 8 steps with CGA overall with significant amount of extra time to complete. Stated she could not do any more due to fatigue. Discussed concerns in regards to a flight (13 steps) for home access on HD days (3 x a week) and pt in agreement stating she just hoped she would be able to do it. End of session left pt in w/c awaiting next therapist with all needs in reach.  Therapy Documentation Precautions:  Precautions Precautions: Fall, Other (comment) Precaution Comments: L lower abdominal drain, vertigo, orthostatic hypotension Restrictions Weight Bearing Restrictions: Yes LLE Weight Bearing: Weight bearing as tolerated   Pain: Pain Assessment Pain Scale: 0-10 Pain Score: 0-No pain Agitated Behavior Scale: TBI Observation Details Observation Environment: therapy Start of observation period - Date: 11/11/22 Start of observation period - Time: 0823 End of observation  period - Date: 11/11/22 End of observation period - Time: 0853 Agitated Behavior Scale (DO NOT LEAVE BLANKS) Short attention span, easy distractibility, inability to concentrate: Absent Impulsive, impatient, low tolerance for pain or frustration: Absent Uncooperative, resistant to care, demanding: Absent Violent and/or threatening violence toward people or property: Absent Explosive and/or unpredictable anger: Absent Rocking, rubbing, moaning, or other self-stimulating behavior: Absent Pulling at tubes, restraints, etc.: Absent Wandering from treatment areas: Absent Restlessness, pacing, excessive movement: Absent Repetitive behaviors, motor, and/or verbal: Absent Rapid, loud, or excessive talking: Absent Sudden changes of mood: Absent Easily initiated or excessive crying and/or laughter: Absent Self-abusiveness, physical and/or verbal: Absent Agitated behavior scale total score: 14    Therapy/Group: Individual Therapy  Karolee Stamps Darrol Poke, PT, DPT, CBIS  11/11/2022, 9:34 AM

## 2022-11-11 NOTE — Progress Notes (Signed)
Speech Language Pathology TBI Note  Patient Details  Name: Kim Burns MRN: [id number] Date of Birth: 08/05/1935  Today's Date: 11/11/2022 SLP Individual Time: 0730-0812 SLP Individual Time Calculation (min): 42 min  Short Term Goals: Week 2: SLP Short Term Goal 1 (Week 2): STGs=LTGs due to ELOS  Skilled Therapeutic Interventions: Skilled treatment session focused on cognitive goals. Upon arrival, patient was awake in bed but appeared lethargic. Patient requested to use the [facility] and required more than a reasonable amount of time and overall Min A for stand pivot transfer to the [facility]. Patient was continent of bowel and demonstrated poor standing tolerance during peri care. Patient transferred back to bed per her request despite encouragement to sit up in the wheelchair. Patient declined the majority of her breakfast tray but was agreeable to eating some fruit from home (sitting on her bedside table) with yogurt. Patient able to demonstrate divided attention between self-feeding and functional conversation that focused on recall and orientation. Patient was oriented X 4 and recalled events from the weekend with overall supervision level verbal cues. Patient appeared brighter at end of session and reported she as looking forward to "get moving" with PT. Patient left upright in bed with alarm on and all needs within reach. Continue with current plan of care.      Pain No/Denies Pain   Agitated Behavior Scale: TBI Observation Details Observation Environment: therapy Start of observation period - Date: 11/11/22 Start of observation period - Time: 0730 End of observation period - Date: 11/11/22 End of observation period - Time: 0812 Agitated Behavior Scale (DO NOT LEAVE BLANKS) Short attention span, easy distractibility, inability to concentrate: Absent Impulsive, impatient, low tolerance for pain or frustration: Absent Uncooperative, resistant to care, demanding: Absent Violent and/or  threatening violence toward people or property: Absent Explosive and/or unpredictable anger: Absent Rocking, rubbing, moaning, or other self-stimulating behavior: Absent Pulling at tubes, restraints, etc.: Absent Wandering from treatment areas: Absent Restlessness, pacing, excessive movement: Absent Repetitive behaviors, motor, and/or verbal: Absent Rapid, loud, or excessive talking: Absent Sudden changes of mood: Absent Easily initiated or excessive crying and/or laughter: Absent Self-abusiveness, physical and/or verbal: Absent Agitated behavior scale total score: 14  Therapy/Group: Individual Therapy  Dayron Odland 11/11/2022, 11:45 AM

## 2022-11-11 NOTE — Progress Notes (Signed)
Occupational Therapy Session Note  Patient Details  Name: Kim Burns MRN: [id number] Date of Birth: [birth year]  Today's Date: 11/11/2022 OT Individual Time: 0915-1000 OT Individual Time Calculation (min): 45 min    Short Term Goals: Week 2:  OT Short Term Goal 1 (Week 2): STG=LTG's d/t LOS  Skilled Therapeutic Interventions/Progress Updates:   Pt seen for skilled OT session following early am ST and PT. Pt asleep in w/c reporting fatigue once awakened and requesting back to bed. Pt in soiled tshirt but deferring changing shirt until tomorrow's session. Agreeable to sink side grooming. Performed standing 4 intervals of 2 min max with close s for oral and hair care with seated rests. RW sit to stand and amb to recliner 15 ft with close S/CGA but increased time. Set recliner up with pillows under buttocks and behind back for comfort and LE's elevated as pt requested back to bed and OT encouraged recliner alternative. Pt requested a snack and able to identify ice cream as preference. Pt completed light UE reacher cane ex for scap, sh, elbow 12 reps x 2 sets with max encouragement but rests and ice cream bites between reps. Left pt recliner level with chair alarm, needs and nurse call button in reach.   Pain: denies pain, reports only fatigue   Therapy Documentation Precautions:  Precautions Precautions: Fall, Other (comment) Precaution Comments: L lower abdominal drain, vertigo, orthostatic hypotension Restrictions Weight Bearing Restrictions: Yes LLE Weight Bearing: Weight bearing as tolerated   Therapy/Group: Individual Therapy  Vicenta Dunning 11/11/2022, 7:45 AM

## 2022-11-11 NOTE — Progress Notes (Signed)
Physical Therapy Session Note  Patient Details  Name: Kim Burns MRN: [id number] Date of Birth: October 21, 1935  Today's Date: 11/11/2022 PT Individual Time: 1045-1130 PT Individual Time Calculation (min): 45 min  and Today's Date: 11/11/2022 PT Missed Time: 30 Minutes Missed Time Reason: Patient fatigue  Short Term Goals: Week 2:  PT Short Term Goal 1 (Week 2): = LTGs due to ELOS  Skilled Therapeutic Interventions/Progress Updates:     Pt received seated in recliner asleep. Pt awakens to verbal and tactile stimuli but is very lethargic, and has difficulty opening eyes. Pt reports pain in Lt hip. PT provides rest breaks and mobility to manage pain. Pt performs sit to stand with minA and cues for anterior weight shifting. Pt ambulates x100' with RW and minA, with cues for upright gaze to improve posture and balance, and increasing gait speed to decrease risk for falls. Pt continues to have difficulty keeping eyes open, and perseverates on feeling "sleepy and tired." Pt provided with extended seated rest break. Pt asked if she would like to go outside and pt is agreeable. Pt transported outside for natural light and therapeutic environment. PT has discussion with pt on current status and why pt is feeling so fatigued and lethargic. Pt encouraged to attempt ambulation outside but pt refused due to fatigue, requesting to return to room. WC transport back to room. Stand step to bed with RW and minA. ModA for sit to supine with management of BLEs. Pt left semi reclined in bed with all needs within reach. Pt misses 30 minutes of skilled PT due to fatigue.   Therapy Documentation Precautions:  Precautions Precautions: Fall, Other (comment) Precaution Comments: L lower abdominal drain, vertigo, orthostatic hypotension Restrictions Weight Bearing Restrictions: Yes LLE Weight Bearing: Weight bearing as tolerated   Therapy/Group: Individual Therapy  Beau Fanny, PT, DPT 11/11/2022, 4:10 PM

## 2022-11-11 NOTE — Progress Notes (Signed)
[facility] Progress Note   Subjective: Seen in room -tired. For dialysis today   Objective Vitals:   11/10/22 1338 11/10/22 2003 11/11/22 0424 11/11/22 0522  BP: 132/63 (!) 145/65 (!) 184/93 (!) 189/88  Pulse: (!) 58 (!) 59 66 62  Resp: 18 16 18    Temp: 98.6 F (37 C) 98.9 F (37.2 C) 97.6 F (36.4 C)   TempSrc:  Oral    SpO2: 98% 97% 100%   Height:       Physical Exam General: Frail, nad Heart: S1,S2 RRR Lungs: Clear, normal wob  Abdomen: Soft non-tender  Extremities: No LE edema Dialysis Access: L AVF + T/B LUE edema improving     Additional Objective Labs: Basic Metabolic Panel: Recent Labs  Lab 11/04/22 1159 11/07/22 0054  NA 130* 130*  K 4.2 3.8  CL 95* 94*  CO2 24 27  GLUCOSE 154* 125*  BUN 48* 24*  CREATININE 4.08* 2.42*  CALCIUM 8.8* 8.4*  PHOS 4.1  --    Liver Function Tests: Recent Labs  Lab 11/04/22 1159  ALBUMIN 2.1*   No results for input(s): "LIPASE", "AMYLASE" in the last 168 hours. CBC: Recent Labs  Lab 11/04/22 1159  WBC 10.2  HGB 11.3*  HCT 34.9*  MCV 95.4  PLT 264   Blood Culture    Component Value Date/Time   SDES BLOOD RIGHT ANTECUBITAL 01/22/2022 0038   SPECREQUEST  01/22/2022 0038    BOTTLES DRAWN AEROBIC AND ANAEROBIC Blood Culture results may not be optimal due to an inadequate volume of blood received in culture bottles   CULT  01/22/2022 0038    NO GROWTH 5 DAYS Performed at [facility], [address] [place], [place] [address]    REPTSTATUS 01/27/2022 FINAL 01/22/2022 0038    Cardiac Enzymes: No results for input(s): "CKTOTAL", "CKMB", "CKMBINDEX", "TROPONINI" in the last 168 hours. CBG: Recent Labs  Lab 11/10/22 0623 11/10/22 1137 11/10/22 1655 11/10/22 2125 11/11/22 0618  GLUCAP 112* 126* 186* 186* 90   Iron Studies: No results for input(s): "IRON", "TIBC", "TRANSFERRIN", "FERRITIN" in the last 72 hours. @lablastinr3 @ Studies/Results: No results found. Medications:    (feeding supplement) PROSource Plus  30 mL Oral BID BM   acetaminophen  500 mg Oral TID   amantadine  100 mg Oral Weekly   amiodarone  200 mg Oral Daily   aspirin  81 mg Oral Daily   benzonatate  100 mg Oral TID   Chlorhexidine Gluconate Cloth  6 each Topical Q0600   doxercalciferol  6 mcg Intravenous Q M,W,F-HD   guaiFENesin  1,200 mg Oral BID   insulin aspart  0-5 Units Subcutaneous QHS   insulin aspart  0-6 Units Subcutaneous TID WC   lidocaine  1 patch Transdermal Q24H   lipase/protease/amylase  36,000 Units Oral BID WC   metoprolol succinate  25 mg Oral Daily   rOPINIRole  0.5 mg Oral QHS   sevelamer carbonate  800 mg Oral TID WC   valACYclovir  500 mg Oral QHS     Dialysis Orders: MWF at [facility] 3.5hr, 350/A1.5, EDW 55kg, 2K/2C bath, L AVF - no heparin - Mircera was on hold at time of admit - Hectoral 6 mcg IV q HD   Assessment/Plan: Head injury/fall from bed -> R frontal hemorrhagic contusion, R frontal ischemic CVA, small B SDHs, L posterior scalp hematoma, non-displaced L parietal-occipital bone fracture: Now in CIR ESRD: Continue HD on usual MWF schedule - next HD 11/11/2022. Labs with  HD.  HTN/volume: BP fine but ongoing L arm and BLE edema on exam. UF as tolerated. Currently under OP EDW.  Dialysis access: Worsening LUE edema raising concern for stenosis.  Less concerning for infection. Appreciate VVS evaluation - no need for intervention at this time.  Anemia of ESRD: Hgb 11.3, no ESA at this time. Secondary hyperparathyroidism:  CorrCa/Phos to goal - continue Renvela + VDRA. Nutrition: Alb low, continue supplements.  Tomasa Blase PA-C [facility] 11/11/2022,10:44 AM

## 2022-11-12 ENCOUNTER — Ambulatory Visit: Payer: Medicare PPO | Admitting: Nurse Practitioner

## 2022-11-12 LAB — GLUCOSE, CAPILLARY
Glucose-Capillary: 152 mg/dL — ABNORMAL HIGH (ref 70–99)
Glucose-Capillary: 189 mg/dL — ABNORMAL HIGH (ref 70–99)
Glucose-Capillary: 131 mg/dL — ABNORMAL HIGH (ref 70–99)
Glucose-Capillary: 161 mg/dL — ABNORMAL HIGH (ref 70–99)

## 2022-11-12 LAB — HEPATITIS B SURFACE ANTIBODY, QUANTITATIVE: Hep B S AB Quant (Post): <3.5 m[IU]/mL — ABNORMAL LOW

## 2022-11-12 NOTE — Progress Notes (Signed)
Occupational Therapy Session Note  Patient Details  Name: Kim Burns MRN: [id number] Date of Birth: 12-23-1935  Today's Date: 11/12/2022 OT Individual Time: 1430-1531 OT Individual Time Calculation (min): 61 min    Short Term Goals: Week 2:  OT Short Term Goal 1 (Week 2): STG=LTG's d/t LOS  Skilled Therapeutic Interventions/Progress Updates:   Pt seen for skilled OT session with focus on Family Education with son. Pt w/c level upon OT arrival. Transported pt via w/c to ADL apt for mirroring son's home set up where pt will stay post d/c until able to manage stairs. Son able to teach back use of gait belt and RW to access TTB in tub shower and amb from w/c to and from toilet with safety commode frame. Son educated on elevating seating surfaces to allow for ease of sit to and from stand. Once back in room, OT training with full UE HEP and BORG scale to integrate along with energy conservation and breathing strategies. Son assisted pt with + teach back and safety for pt to move back to bed. Left pt and son with all needs in reach and rec HHOT and 3 in1 commode with contact with MSW for d/c.   Pain: denies pain   Therapy Documentation Precautions:  Precautions Precautions: Fall, Other (comment) Precaution Comments: L lower abdominal drain, vertigo, orthostatic hypotension Restrictions Weight Bearing Restrictions: Yes LLE Weight Bearing: Weight bearing as tolerated   Therapy/Group: Individual Therapy  Vicenta Dunning 11/12/2022, 7:43 AM

## 2022-11-12 NOTE — Progress Notes (Signed)
Physical Therapy Session Note  Patient Details  Name: Kim Burns MRN: [id number] Date of Birth: 05/01/1935  Today's Date: 11/12/2022 PT Individual Time: [phone] and [phone] PT Individual Time Calculation (min): 56 min and 40 min  Short Term Goals: Week 2:  PT Short Term Goal 1 (Week 2): = LTGs due to ELOS  Skilled Therapeutic Interventions/Progress Updates:     Pt received semi reclined in bed and agrees to therapy. No complaint of pain. PT provides totalA for donning knee high Ted hose prior to mobility. Pt performs supine to sit with bed features and cues for body mechanics and use of bed features. Pt provided with increased time at edge of bed to acclimate to upright positioning. No complaint of dizziness for vertiginous symptoms. Pt performs sit to stand with CGA and cues for initiation and hand placement. Pt ambulates slowly, x100' to gym with RW and CGA, with cues for upright gaze to improve posture and balance, and decreasing WB through RW for energy conservation. Following extended seated rest break, pt ambulates x130' with RW and close supervision. Pt ambulates extremely slowly, and requires several brief standing rest breaks. PT also provides cues for safe sequencing and AD management when transitioning back to seated. During rest break, pt verbalizes discomfort in back and requests back support. PT provides repositioning to address pain, having pt stand and ambulate back to room, x80', with RW and CGA. Pt sits in WC and verbalizes extreme fatigue, stating she cannot even hold her head up. PT provides pillow and pt left seated in WC with alarm intact and all needs within reach.   2nd Session: Pt received semi reclined in bed with son present for family education. Pt attempting to eat soup in reclined position so PT encourages pt to mobilize to [facility] for lunch and pt is agreeable. No complaint of pain. Pt performs bed mobility with cues for use of bed features, sequencing and  positioning. PT assists to don shoes and then pt performs stand step transfer from bed to [facility] with CGA and cues for positioning. PT then has discussio nwith pt and son regarding pt's mobility progress during rehab stay, recommendations for safe mobility following discharge, as well as pt's tendency for large fluctuations in energy levels and safety concerns should pt attempt to DC to own home with 13 steps to enter. Pt and son discuss concerns with PT and eventually verbalize agreement that pt will discharge to home without flight of stairs, as well as increased supervision. Pt left seated in WC with alarm intact and all needs within reach.   Therapy Documentation Precautions:  Precautions Precautions: Fall, Other (comment) Precaution Comments: L lower abdominal drain, vertigo, orthostatic hypotension Restrictions Weight Bearing Restrictions: Yes LLE Weight Bearing: Weight bearing as tolerated   Therapy/Group: Individual Therapy  Beau Fanny, PT, DPT 11/12/2022, 4:13 PM

## 2022-11-12 NOTE — Progress Notes (Addendum)
Contacted by rehab CSW that pt's d/c plan has changed as of this afternoon. Pt will be going to stay with son, [name], in [place], [place] at d/c from rehab. Spoke to pt's son via phone who confirms this information. Pt's son prefers [facility] clinic in [place], [place] if possible. Referral submitted to [facility] admissions this afternoon to request a transient chair for pt starting Friday if possible. Pt's son is agreeable to pt receiving HD at pt's [facility] clinic on Friday if needed but does have concerns of whether pt would be able to navigate the stairs coming and going to HD. Update provided to renal PA and staff at [facility]. Renal PA has ordered Hep B total core antibody which is required by [facility] for referral and navigator will fax results once available. Will assist as needed.  Olivia Canter Renal Navigator [phone]

## 2022-11-12 NOTE — Progress Notes (Signed)
Patient ID: Kim Burns, female   DOB: 09/21/1935, 87 y.o.   MRN: [id number]  [facility] ordered through [employer].

## 2022-11-12 NOTE — Progress Notes (Signed)
Patient ID: NKAUJ MASKER, female   DOB: 13-Jul-1935, 87 y.o.   MRN: [id number]  Team Conference Report to Patient/Family  Team Conference discussion was reviewed with the patient and caregiver, including goals, any changes in plan of care and target discharge date.  Patient and caregiver express understanding and are in agreement.  The patient has a target discharge date of 11/14/22.   Covering for primary SW, Auria C.   Sw spoke with pt's son, [name]. Son plans to be present for family education today 1-4 PM. Son has some concerns with discharge on Thursday due to pt refusing/missing therapies over the weekend.  Son will participate in family education and then address any questions or concerns with the team during that time. No additional questions or concerns. Andria Rhein 11/12/2022, 11:22 AM

## 2022-11-12 NOTE — Patient Care Conference (Signed)
Inpatient RehabilitationTeam Conference and Plan of Care Update Date: 11/12/2022   Time: 10:11 AM   Patient Name: Kim Burns      Medical Record Number: [id number]  Date of Birth: May 30, 1935 Sex: Female         Room/Bed: 4W14C/4W14C-01 Payor Info: Payor: [employer] MEDICARE / Plan: [employer] MEDICARE CHOICE PPO / Product Type: *No Product type* /    Admit Date/Time:  10/30/2022  9:16 PM  Primary Diagnosis:  TBI (traumatic brain injury) [facility])  Hospital Problems: Principal Problem:   TBI (traumatic brain injury) [facility])    Expected Discharge Date: Expected Discharge Date: 11/14/22  Team Members Present: Physician leading conference: Dr. Faith Rogue Social Worker Present: Lavera Guise, BSW Nurse Present: Vedia Pereyra, RN PT Present: Malachi Pro, PT OT Present: Roney Mans, OT SLP Present: Feliberto Gottron, SLP PPS Coordinator present : Fae Pippin, SLP     Current Status/Progress Goal Weekly Team Focus  Bowel/Bladder   continent of b/b   Remain continent   Assist with toileting needs qshift and prn    Swallow/Nutrition/ Hydration               ADL's   SPT to commode with CGA. Managed pull on pants and brief down and up after toileting with CGA. Min A use of reacher to don pants over feet wiht min cues, RW sit to stand and amb to recliner 15 ft with close S/CGA. Standing 4 intervals of 2 min max with close s for oral and hair care with seated rests. Seated UB sponge bathing and pull on shirt with set up only. With fatigue can need increased assist and when feeling well can outperform these levels.   Supervision   continues to have fluctuating fatigue levels impacting functional consistency, accessing her home and LB self care when fatigued is largest barrier    Mobility   minA bed mobility, supervision transfers and gait up to 130', extremely limited endurance   Supervision  family ed, endurance, dc prep    Communication                 Safety/Cognition/ Behavioral Observations  Supervision with overall functioning fluctuating throughout the day due to fatigue   Supervision   Family education    Pain   no c/o pain   Remain pain free   Assess qshift and prn    Skin   Skin intact, L upper arm fiscula bleeding and swollen   Maintain skin integrity  Assess skin qshift and prn      Discharge Planning:  D/c plan remains to home with support from her son. Pt dtr will come up a few times throughout the year to assist. Fam edu Tues (9/24) 1pm-4pm. Dialysis MWF. SW will confirm there are no barriers to discharge.   Team Discussion: TBI with polytrauma.  [place] level VII. Behavior plan. Time toileting. Wound care plan.  Sleep chart. HD M/W/F.  Daily weights.  Fatigues quickly.  Pain managed with PRN medications. DTI right buttocks with attached edges, no drainage.  Skin tear to left arm without drainage. Nephrostomy drain with approximately 50ml daily. Tolerating heart healthy diet with 1200 fluid restriction.  AC/HS. Patient now agreeing to go home with son.  Therapies wax and wane.  If awake and alert does well.  Working through mood changes.  Patient on target to meet rehab goals: yes, will meet goal discharge date 11/14/22  *See Care Plan and progress notes for long and short-term goals.  Revisions to Treatment Plan:  Amantadine added.  Family education today with son. Monitor labs/VS Teaching Needs: Medications, safety, self care, skin/wound care, gait/transfer training, etc.   Current Barriers to Discharge: Decreased caregiver support, Wound care, and Behavior  Possible Resolutions to Barriers: Family education Independent with nephrostomy tube Improved endurance Order recommended DMe     Medical Summary Current Status: arousal has waxed and waned. was very fatigued yesterday. pain improved. vertigo improved also  Barriers to Discharge: Medical stability;Uncontrolled Pain   Possible Resolutions to  [employer]: daily assessment of meds and pt data. amantadine for arousal. HD at end of day to maximize therapy participation   Continued Need for Acute Rehabilitation Level of Care: The patient requires daily medical management by a physician with specialized training in physical medicine and rehabilitation for the following reasons: Direction of a multidisciplinary physical rehabilitation program to maximize functional independence : Yes Medical management of patient stability for increased activity during participation in an intensive rehabilitation regime.: Yes Analysis of laboratory values and/or radiology reports with any subsequent need for medication adjustment and/or medical intervention. : Yes   I attest that I was present, lead the team conference, and concur with the assessment and plan of the team.   Jearld Adjutant 11/12/2022, 1:13 PM

## 2022-11-12 NOTE — Progress Notes (Signed)
PROGRESS NOTE   Subjective/Complaints:  Pt more alert this morning. Feels ok. Said she was worn out yesterday. After I left, she worked with PT and became very fatigued again.   ROS: Patient denies fever, rash, sore throat, blurred vision, dizziness, nausea, vomiting, diarrhea, cough, shortness of breath or chest pain,  headache, or mood change.     Objective:   No results found. Recent Labs    11/11/22 1200  WBC 10.3  HGB 11.1*  HCT 34.7*  PLT 191    Recent Labs    11/11/22 1200  NA 129*  K 4.3  CL 94*  CO2 22  GLUCOSE 142*  BUN 49*  CREATININE 4.03*  CALCIUM 9.0     Intake/Output Summary (Last 24 hours) at 11/12/2022 1129 Last data filed at 11/12/2022 4782 Gross per 24 hour  Intake 180 ml  Output 2000 ml  Net -1820 ml     Pressure Injury 10/21/22 Buttocks Right Deep Tissue Pressure Injury - Purple or maroon localized area of discolored intact skin or blood-filled blister due to damage of underlying soft tissue from pressure and/or shear. 2 areas of deep tissue pressure in (Active)  10/21/22 1145  Location: Buttocks  Location Orientation: Right  Staging: Deep Tissue Pressure Injury - Purple or maroon localized area of discolored intact skin or blood-filled blister due to damage of underlying soft tissue from pressure and/or shear.  Wound Description (Comments): 2 areas of deep tissue pressure injuries  Present on Admission: Yes    Physical Exam: Vital Signs Blood pressure 129/65, pulse (!) 58, temperature 97.9 F (36.6 C), resp. rate 15, height 5\' 3"  (1.6 m), weight 54.5 kg, SpO2 100%.       Constitutional: No distress . Vital signs reviewed. HEENT: NCAT, EOMI, oral membranes moist Neck: supple Cardiovascular: RRR without murmur. No JVD    Respiratory/Chest: CTA Bilaterally without wheezes or rales. Normal effort    GI/Abdomen: BS +, non-tender, non-distended Ext: no clubbing, cyanosis, or  edema Psych: pleasant and cooperative  Skin: area  below AVF-- improved.  Right buttock blister. Nephrostomy tube/drain in place with brown drainage.  Neuro:  awake and alert.    Functional Memory. Normal language and speech. Cranial nerve exam unremarkable. MMT: RUE 4/5, LUE 4/5. RLE 3/5 prox to 4/5 distally. LLE 1/5 hip to 4/5 ADF/PF. Sensory exam normal for light touch and pain in all 4 limbs. No obvious nystagmus Musculoskeletal: Left hip tender with palpation and with movement.     Assessment/Plan: 1. Functional deficits which require 3+ hours per day of interdisciplinary therapy in a comprehensive inpatient rehab setting. Physiatrist is providing close team supervision and 24 hour management of active medical problems listed below. Physiatrist and rehab team continue to assess barriers to discharge/monitor patient progress toward functional and medical goals  Care Tool:  Bathing    Body parts bathed by patient: Right arm, Chest, Left arm, Abdomen, Front perineal area, Buttocks, Face, Right upper leg, Left upper leg   Body parts bathed by helper: Right lower leg, Left lower leg     Bathing assist Assist Level: Moderate Assistance - Patient 50 - 74%     Upper Body Dressing/Undressing Upper  body dressing   What is the patient wearing?: Pull over shirt    Upper body assist Assist Level: Contact Guard/Touching assist    Lower Body Dressing/Undressing Lower body dressing      What is the patient wearing?: Pants, Incontinence brief     Lower body assist Assist for lower body dressing: Moderate Assistance - Patient 50 - 74%     Toileting Toileting Toileting Activity did not occur Press photographer and hygiene only): N/A (no void or bm)  Toileting assist Assist for toileting: Minimal Assistance - Patient > 75%     Transfers Chair/bed transfer  Transfers assist  Chair/bed transfer activity did not occur: Safety/medical concerns (due to dizziness)  Chair/bed transfer  assist level: Contact Guard/Touching assist     Locomotion Ambulation   Ambulation assist      Assist level: Contact Guard/Touching assist Assistive device: Walker-rolling Max distance: 75'   Walk 10 feet activity   Assist     Assist level: Contact Guard/Touching assist Assistive device: Walker-rolling   Walk 50 feet activity   Assist Walk 50 feet with 2 turns activity did not occur: Safety/medical concerns  Assist level: Contact Guard/Touching assist Assistive device: Walker-rolling    Walk 150 feet activity   Assist Walk 150 feet activity did not occur: Safety/medical concerns  Assist level: Contact Guard/Touching assist Assistive device: Walker-rolling    Walk 10 feet on uneven surface  activity   Assist     Assist level: Minimal Assistance - Patient > 75% Assistive device: Other (comment) (2 handrails)   Wheelchair     Assist Is the patient using a wheelchair?: Yes Type of Wheelchair: Manual    Wheelchair assist level: Dependent - Patient 0% Max wheelchair distance: 150'    Wheelchair 50 feet with 2 turns activity    Assist        Assist Level: Dependent - Patient 0%   Wheelchair 150 feet activity     Assist          Blood pressure 129/65, pulse (!) 58, temperature 97.9 F (36.6 C), resp. rate 15, height 5\' 3"  (1.6 m), weight 54.5 kg, SpO2 100%.  Medical Problem List and Plan: 1. Functional deficits secondary to TBI and polytrauma due to a fall with associated Croop contrecoup brain injury, right frontal lobe hemorrhagic contusion and right frontal ischemic nonhemorrhagic infarct.             -patient may shower             -ELOS/Goals: extending stay to 9/24 potentially, PT/OT/SLP mod I to supervision             --Continue CIR therapies including PT, OT, and SLP. Interdisciplinary team conference today to discuss goals, barriers to discharge, and dc planning.    -family ed today. May need more supervision d/t  intermittent fatigue. 2.  Antithrombotics: -DVT/anticoagulation:  Mechanical: Sequential compression devices, below knee Bilateral lower extremities             -antiplatelet therapy: ASA 3. Pain Management: Tylenol prn 9/23-   HA- given tylenol .  4. Mood/Behavior/Sleep: LCSW to follow for evaluation and support.              -antipsychotic agents: N/A             --chronic insomnia. Napping during day creates a problem   - continue trial of amantadine 100mg  weekly (ESRD) after discussion with pharmacy 5. Neuropsych/cognition: This patient is not fully capable of making  decisions on her own behalf. 6. Skin/Wound Care: Routine pressure relief measures.  7. Fluids/Electrolytes/Nutrition: Strict I/O. Change to renal diet. Continue 1500 cc FR. -hypokalemia--per renal team for replacement  8. A fib w/RVR/Acute systolic CHF: Fluid overload due to A fib and managed with HD.              --Duo nebs prn added   for SOB? 1500 cc FR/monitor weights daily.              --monitor HR TID--on amiodarone taper. Continue metoprolol  -still no weights. Will request AGAIN Filed Weights       9. HTN: Monitor BP TID--on metoprolol bid.  Monitor with mobility  9/18 controlled  9/21- BP somewhat elevated 150s- will monitor for trend- had been controlled. 9/22- BP 119-156 systolic- will con't to monitor  10. ESRD: HD MWF at the end of the day to help with tolerance of therapy             --hx of intradialysis hypotension (midodrine in the past)   -AVF ok per vascular surgery 11. Pancreatic insufficiency s/p Whipple: Continue Creon with meals  BID ac             --has dumping syndrome (abdominal pain with meals followed by diarrhea at times) 12. Left pubic rami/Sacral Ala at S4: WBAT LLE per Dr. Thad Ranger. Follow up Xrays in 2 weeks. 13. L-renal mass (likely RCC) urine leak s/p chronic nephrostomy tube: Tube exchanged out on 09/03 by IR per protocol             --not on AC due to bleeding from nephrostomy  tube/kidney.  Measure output daily.   14. T2DM diet controlled w/peripheral neuropathy: Monitor BS ac/hs and use SSI for elevated BS.  --Hgb A1C- 6.1. will add CM restrictions.  CBG (last 3)  Recent Labs    11/11/22 1136 11/11/22 2048 11/12/22 0553  GLUCAP 120* 91 152*   9/24--continue diet control. Reasonable readings at present 15. Likely BPPV--  some improvement               --an orthostatic component also      -  meclizine  change to prn  9/24 sx  better  16. Anemia of CKD: Monitor with serial checks. H/H stable in 10 range.    17. H/o Shingles w/optic nerve involvement: Continue Valtrex.  18. Restless leg syndrome: Continue Requip 0.5 mg HS with additional 0.25 mg prn during the day.     19. Bradycardia: resolved, continue to monitor HR TID  20. Orthostasis: continue teds with therapy only   -resting bp's better with activity.   22. L forearm swelling and redness-improved  -continue to elevate LUE as possible   LOS: 13 days A FACE TO FACE EVALUATION WAS PERFORMED  Ranelle Oyster 11/12/2022, 11:29 AM

## 2022-11-12 NOTE — Progress Notes (Signed)
Speech Language Pathology TBI Note  Patient Details  Name: Kim Burns MRN: [id number] Date of Birth: 1935/04/26  Today's Date: 11/12/2022 SLP Individual Time: 1350-1415 SLP Individual Time Calculation (min): 25 min  Short Term Goals: Week 2: SLP Short Term Goal 1 (Week 2): STGs=LTGs due to ELOS  Skilled Therapeutic Interventions: Skilled treatment session focused on completion of family education with the patient and her son. SLP facilitated session by providing education regarding strategies to utilize at home to maximize recall of daily information including implementing a daily routine. SLP emphasized the importance of patient staying cognitively engaged at home and provided a list of activities/tasks patient can participate in to maximize cognitive recovery, memory, and overall independence. SLP also reinforced the importance of 24 hour supervision at home to ensure safe decision making. Patient and her son verbalized understanding and handouts were given to reinforce education. Patient left upright in wheelchair with son present. Continue with current plan of care.      Pain No/Denies Pain   Agitated Behavior Scale: TBI  ABS discontinued d/t ABS score less than 20 for the last three days or no behaviors present  Therapy/Group: Individual Therapy  Bellamy Rubey 11/12/2022, 3:14 PM

## 2022-11-12 NOTE — Progress Notes (Signed)
Patient ID: Kim Burns, female   DOB: 09-27-1935, 87 y.o.   MRN: [id number]   SW met with patient and son in the room/ Son has expressed the new plan is for his mother to stay with him in [place]. SW will inform team of change.

## 2022-11-12 NOTE — Progress Notes (Signed)
[facility] Progress Note   Subjective: Seen in room. Sleeping on my exam. Completed dialysis yesterday net UF 2L   Objective Vitals:   11/11/22 1750 11/11/22 1759 11/11/22 1802 11/11/22 1917  BP: 95/68 (!) 141/64 137/67 129/65  Pulse: (!) 47 91 (!) 38 (!) 58  Resp: 16 12 14 15   Temp: (!) 97.5 F (36.4 C)   97.9 F (36.6 C)  TempSrc: Axillary     SpO2: 100% 100% 100% 100%  Height:       Physical Exam General: Frail, nad Heart: S1,S2 RRR Lungs: Clear, normal wob  Abdomen: Soft non-tender  Extremities: No LE edema Dialysis Access: L AVF + T/B LUE edema improving     Additional Objective Labs: Basic Metabolic Panel: Recent Labs  Lab 11/07/22 0054 11/11/22 1200  NA 130* 129*  K 3.8 4.3  CL 94* 94*  CO2 27 22  GLUCOSE 125* 142*  BUN 24* 49*  CREATININE 2.42* 4.03*  CALCIUM 8.4* 9.0  PHOS  --  6.1*   Liver Function Tests: Recent Labs  Lab 11/11/22 1200  ALBUMIN 2.1*   No results for input(s): "LIPASE", "AMYLASE" in the last 168 hours. CBC: Recent Labs  Lab 11/11/22 1200  WBC 10.3  HGB 11.1*  HCT 34.7*  MCV 96.4  PLT 191   Blood Culture    Component Value Date/Time   SDES BLOOD RIGHT ANTECUBITAL 01/22/2022 0038   SPECREQUEST  01/22/2022 0038    BOTTLES DRAWN AEROBIC AND ANAEROBIC Blood Culture results may not be optimal due to an inadequate volume of blood received in culture bottles   CULT  01/22/2022 0038    NO GROWTH 5 DAYS Performed at [facility], [address] [place], [place] [address]    REPTSTATUS 01/27/2022 FINAL 01/22/2022 0038    Cardiac Enzymes: No results for input(s): "CKTOTAL", "CKMB", "CKMBINDEX", "TROPONINI" in the last 168 hours. CBG: Recent Labs  Lab 11/10/22 2125 11/11/22 0618 11/11/22 1136 11/11/22 2048 11/12/22 0553  GLUCAP 186* 90 120* 91 152*   Iron Studies: No results for input(s): "IRON", "TIBC", "TRANSFERRIN", "FERRITIN" in the last 72 hours. @lablastinr3 @ Studies/Results: No  results found. Medications:   (feeding supplement) PROSource Plus  30 mL Oral BID BM   acetaminophen  500 mg Oral TID   amantadine  100 mg Oral Weekly   amiodarone  200 mg Oral Daily   aspirin  81 mg Oral Daily   benzonatate  100 mg Oral TID   Chlorhexidine Gluconate Cloth  6 each Topical Q0600   doxercalciferol  6 mcg Intravenous Q M,W,F-HD   guaiFENesin  1,200 mg Oral BID   insulin aspart  0-5 Units Subcutaneous QHS   insulin aspart  0-6 Units Subcutaneous TID WC   lidocaine  1 patch Transdermal Q24H   lipase/protease/amylase  36,000 Units Oral BID WC   metoprolol succinate  25 mg Oral Daily   rOPINIRole  0.5 mg Oral QHS   sevelamer carbonate  800 mg Oral TID WC   valACYclovir  500 mg Oral QHS     Dialysis Orders: MWF at [facility] 3.5hr, 350/A1.5, EDW 55kg, 2K/2C bath, L AVF - no heparin - Mircera was on hold at time of admit - Hectoral 6 mcg IV q HD   Assessment/Plan: Head injury/fall from bed -> R frontal hemorrhagic contusion, R frontal ischemic CVA, small B SDHs, L posterior scalp hematoma, non-displaced L parietal-occipital bone fracture: Now in CIR ESRD: Continue HD on usual MWF schedule - next HD  9/25 HTN/volume: BP fine but ongoing L arm and BLE edema on exam. UF as tolerated. Currently under OP EDW.  Dialysis access: Worsening LUE edema raising concern for stenosis.  Less concerning for infection. Appreciate VVS evaluation - no need for intervention at this time.  Anemia of ESRD: Hgb 11.3, no ESA at this time. Secondary hyperparathyroidism:  CorrCa/Phos to goal - continue Renvela + VDRA. Nutrition: Alb low, continue supplements.  Tomasa Blase PA-C [facility] 11/12/2022,9:32 AM

## 2022-11-13 LAB — GLUCOSE, CAPILLARY
Glucose-Capillary: 119 mg/dL — ABNORMAL HIGH (ref 70–99)
Glucose-Capillary: 153 mg/dL — ABNORMAL HIGH (ref 70–99)
Glucose-Capillary: 93 mg/dL (ref 70–99)

## 2022-11-13 LAB — VITAMIN B12: Vitamin B-12: 543 pg/mL (ref 180–914)

## 2022-11-13 LAB — RENAL FUNCTION PANEL
Albumin: 2.1 g/dL — ABNORMAL LOW (ref 3.5–5.0)
Anion gap: 14 (ref 5–15)
BUN: 42 mg/dL — ABNORMAL HIGH (ref 8–23)
CO2: 24 mmol/L (ref 22–32)
Calcium: 9.4 mg/dL (ref 8.9–10.3)
Chloride: 93 mmol/L — ABNORMAL LOW (ref 98–111)
Creatinine, Ser: 3.8 mg/dL — ABNORMAL HIGH (ref 0.44–1.00)
GFR, Estimated: 11 mL/min — ABNORMAL LOW (ref >60)
Glucose, Bld: 141 mg/dL — ABNORMAL HIGH (ref 70–99)
Phosphorus: 5.7 mg/dL — ABNORMAL HIGH (ref 2.5–4.6)
Potassium: 3.9 mmol/L (ref 3.5–5.1)
Sodium: 131 mmol/L — ABNORMAL LOW (ref 135–145)

## 2022-11-13 LAB — CBC
HCT: 31.4 % — ABNORMAL LOW (ref 36.0–46.0)
Hemoglobin: 10.4 g/dL — ABNORMAL LOW (ref 12.0–15.0)
MCH: 31.6 pg (ref 26.0–34.0)
MCHC: 33.1 g/dL (ref 30.0–36.0)
MCV: 95.4 fL (ref 80.0–100.0)
Platelets: 188 10*3/uL (ref 150–400)
RBC: 3.29 MIL/uL — ABNORMAL LOW (ref 3.87–5.11)
RDW: 16.9 % — ABNORMAL HIGH (ref 11.5–15.5)
WBC: 8.1 10*3/uL (ref 4.0–10.5)
nRBC: 0 % (ref 0.0–0.2)

## 2022-11-13 LAB — T4, FREE: Free T4: 0.94 ng/dL (ref 0.61–1.12)

## 2022-11-13 LAB — TSH: TSH: 3.867 u[IU]/mL (ref 0.350–4.500)

## 2022-11-13 LAB — VITAMIN D 25 HYDROXY (VIT D DEFICIENCY, FRACTURES): Vit D, 25-Hydroxy: 32.15 ng/mL (ref 30–100)

## 2022-11-13 LAB — HEPATITIS B CORE ANTIBODY, TOTAL: Hep B Core Total Ab: NONREACTIVE

## 2022-11-13 LAB — FOLATE: Folate: 19.3 ng/mL (ref >5.9)

## 2022-11-13 MED ORDER — HEPARIN SODIUM (PORCINE) 1000 UNIT/ML DIALYSIS: 1000.0000 [IU] | INTRAMUSCULAR | Status: DC | PRN

## 2022-11-13 MED ORDER — ANTICOAGULANT SODIUM CITRATE 4% (200MG/5ML) IV SOLN: 5.0000 mL | Status: DC | PRN

## 2022-11-13 MED ORDER — LIDOCAINE 5 % EX PTCH
1.0000 | MEDICATED_PATCH | CUTANEOUS | 0 refills | Status: DC
  Filled 2022-11-13: qty 30, 30d supply, fill #0

## 2022-11-13 MED ORDER — ASPIRIN 81 MG PO CHEW
81.0000 mg | CHEWABLE_TABLET | Freq: Every day | ORAL | 0 refills | Status: DC
Start: 2022-11-13 — End: 2023-06-16
  Filled 2022-11-13: qty 90, 90d supply, fill #0

## 2022-11-13 MED ORDER — ALTEPLASE 2 MG IJ SOLR: 2.0000 mg | Freq: Once | INTRAMUSCULAR | Status: DC | PRN

## 2022-11-13 MED ORDER — BENZONATATE 100 MG PO CAPS
100.0000 mg | ORAL_CAPSULE | Freq: Three times a day (TID) | ORAL | 0 refills | Status: DC | PRN
  Filled 2022-11-13: qty 45, 15d supply, fill #0

## 2022-11-13 MED ORDER — PENTAFLUOROPROP-TETRAFLUOROETH EX AERO: 1.0000 | INHALATION_SPRAY | CUTANEOUS | Status: DC | PRN

## 2022-11-13 MED ORDER — LIDOCAINE HCL (PF) 1 % IJ SOLN: 5.0000 mL | INTRAMUSCULAR | Status: DC | PRN

## 2022-11-13 MED ORDER — METOPROLOL SUCCINATE ER 25 MG PO TB24
25.0000 mg | ORAL_TABLET | Freq: Every day | ORAL | 0 refills | Status: DC
  Filled 2022-11-13: qty 30, 30d supply, fill #0

## 2022-11-13 MED ORDER — AMANTADINE HCL 100 MG PO CAPS
100.0000 mg | ORAL_CAPSULE | ORAL | 0 refills | Status: DC
  Filled 2022-11-13: qty 30, 210d supply, fill #0
  Filled 2022-11-14: qty 12, 84d supply, fill #0

## 2022-11-13 MED ORDER — GUAIFENESIN ER 600 MG PO TB12
1200.0000 mg | ORAL_TABLET | Freq: Two times a day (BID) | ORAL | 0 refills | Status: DC
Start: 2022-11-13 — End: 2023-01-02
  Filled 2022-11-13: qty 30, 8d supply, fill #0

## 2022-11-13 MED ORDER — CYCLOSPORINE 0.05 % OP EMUL: 1.0000 [drp] | Freq: Every day | OPHTHALMIC | Status: DC | PRN

## 2022-11-13 MED ORDER — SEVELAMER CARBONATE 800 MG PO TABS: 800.0000 mg | ORAL_TABLET | Freq: Three times a day (TID) | ORAL | Status: DC

## 2022-11-13 MED ORDER — LIDOCAINE-PRILOCAINE 2.5-2.5 % EX CREA: 1.0000 | TOPICAL_CREAM | CUTANEOUS | Status: DC | PRN

## 2022-11-13 MED ORDER — ACETAMINOPHEN 500 MG PO TABS
500.0000 mg | ORAL_TABLET | Freq: Three times a day (TID) | ORAL | 0 refills | Status: DC
Start: 2022-11-13 — End: 2022-12-30
  Filled 2022-11-13: qty 100, 34d supply, fill #0

## 2022-11-13 MED ORDER — AMIODARONE HCL 200 MG PO TABS
200.0000 mg | ORAL_TABLET | Freq: Every day | ORAL | 0 refills | Status: AC
Start: 2022-11-13 — End: ?
  Filled 2022-11-13: qty 30, 30d supply, fill #0

## 2022-11-13 NOTE — Progress Notes (Signed)
Speech Language Pathology TBI Note  Patient Details  Name: MARLEENE KHALILI MRN: [id number] Date of Birth: [birth year]  Today's Date: 11/13/2022 SLP Individual Time: 0812-0855 SLP Individual Time Calculation (min): 43 min  Short Term Goals: Week 2: SLP Short Term Goal 1 (Week 2): STGs=LTGs due to ELOS  Skilled Therapeutic Interventions: Skilled treatment session focused on cognitive goals. Upon arrival, patient was awake in bed and had just completed her breakfast meal. Patient is scheduled to discharge home tomorrow to her son's house, therefore, SLP assisted patient in making a daily routine she could utilize for both dialysis and non dialysis days. Throughout task, patient required overall supervision level verbal cues for planning and anticipatory awareness. Patient required cues to incorporate both physical and cognitive activities and reduce the amount of potential "rest breaks" that would be needed. Patient left upright in bed with all needs within reach. Continue with current plan of care.      Pain Pain Assessment Pain Scale: 0-10 Pain Score: 0-No pain Multiple Pain Sites: No  Agitated Behavior Scale: TBI  ABS discontinued d/t ABS score less than 20 for the last three days or no behaviors present   Therapy/Group: Individual Therapy  Maisen Schmit 11/13/2022, 1:08 PM

## 2022-11-13 NOTE — Discharge Instructions (Addendum)
Inpatient Rehab Discharge Instructions  Kim Burns Discharge date and time:  11/14/22  Activities/Precautions/ Functional Status: Activity: no lifting, driving, or strenuous exercise till cleared by MD Diet: renal diet Limit fluids to 5 cups per day Wound Care: keep wound clean and dry   Functional status:  ___ No restrictions     ___ Walk up steps independently __X_ 24/7 supervision/assistance   ___ Walk up steps with assistance ___ Intermittent supervision/assistance  ___ Bathe/dress independently ___ Walk with walker     _X__ Bathe/dress with assistance ___ Walk Independently    ___ Shower independently _X__ Walk with assistance    ___ Shower with assistance ___ No alcohol     ___ Return to work/school ________  Special Instructions:    My questions have been answered and I understand these instructions. I will adhere to these goals and the provided educational materials after my discharge from the hospital.  Patient/Caregiver Signature _______________________________ Date __________  Clinician Signature _______________________________________ Date __________  Please bring this form and your medication list with you to all your follow-up doctor's appointments.

## 2022-11-13 NOTE — Progress Notes (Addendum)
Spoke to [facility] admissions this morning. Pt's referral was received. Hep B total core antibody lab is still pending and will fax to [facility] once available.   Olivia Canter Renal Navigator [phone]   Addendum at 1:51 pm: Hep B total core antibody results faxed to [facility] Admissions this afternoon.

## 2022-11-13 NOTE — Progress Notes (Signed)
Received patient in bed to unit.  Alert and oriented.  Informed consent signed and in chart.   TX duration:3.5  Patient tolerated well.  Transported back to the room  Alert, without acute distress.  Hand-off given to patient's nurse.   Access used: Left fistula Access issues: none  Total UF removed: Medication(s) given: none   11/13/22 1839  Vitals  Temp (!) 96.9 F (36.1 C)  Temp Source Axillary  BP (!) 161/70  MAP (mmHg) 96  Pulse Rate (!) 54  ECG Heart Rate (!) 54  Resp 16  Level of Consciousness  Level of Consciousness Alert  MEWS COLOR  MEWS Score Color Green  Oxygen Therapy  SpO2 100 %  O2 Device Room Air  Pain Assessment  Pain Scale 0-10  Pain Score 0  PCA/Epidural/Spinal Assessment  Respiratory Pattern Regular;Unlabored  ECG Monitoring  Cardiac Rhythm NSR  MEWS Score  MEWS Temp 0  MEWS Systolic 0  MEWS Pulse 0  MEWS RR 0  MEWS LOC 0  MEWS Score 0     Stacie Glaze LPN [facility]

## 2022-11-13 NOTE — Plan of Care (Signed)
  Problem: RH Dressing Goal: LTG Patient will perform lower body dressing w/assist (OT) Description: LTG: Patient will perform lower body dressing with assist, with/without cues in positioning using equipment (OT) Outcome: Adequate for Discharge   Problem: RH Dressing Goal: LTG Patient will perform lower body dressing w/assist (OT) Description: LTG: Patient will perform lower body dressing with assist, with/without cues in positioning using equipment (OT) Outcome: Adequate for Discharge   Problem: RH Balance Goal: LTG: Patient will maintain dynamic sitting balance (OT) Description: LTG:  Patient will maintain dynamic sitting balance with assistance during activities of daily living (OT) Outcome: Completed/Met Goal: LTG Patient will maintain dynamic standing with ADLs (OT) Description: LTG:  Patient will maintain dynamic standing balance with assist during activities of daily living (OT)  Outcome: Completed/Met   Problem: Sit to Stand Goal: LTG:  Patient will perform sit to stand in prep for activites of daily living with assistance level (OT) Description: LTG:  Patient will perform sit to stand in prep for activites of daily living with assistance level (OT) Outcome: Completed/Met   Problem: RH Dressing Goal: LTG Patient will perform upper body dressing (OT) Description: LTG Patient will perform upper body dressing with assist, with/without cues (OT). Outcome: Completed/Met   Problem: RH Toileting Goal: LTG Patient will perform toileting task (3/3 steps) with assistance level (OT) Description: LTG: Patient will perform toileting task (3/3 steps) with assistance level (OT)  Outcome: Completed/Met   Problem: RH Toilet Transfers Goal: LTG Patient will perform toilet transfers w/assist (OT) Description: LTG: Patient will perform toilet transfers with assist, with/without cues using equipment (OT) Outcome: Completed/Met

## 2022-11-13 NOTE — Progress Notes (Signed)
Attempted to get report.  Secretary said nurse on lunch and will call back

## 2022-11-13 NOTE — Progress Notes (Signed)
Occupational Therapy Discharge Summary  Patient Details  Name: Kim Burns MRN: [id number] Date of Birth: 1936-02-17  Date of Discharge from OT service:November 15, 2022  OT Treatment Time: 2376-2831 72 min   Patient has met 7 of 7 long term goals due to improved activity tolerance, improved balance, postural control, ability to compensate for deficits, functional use of  RIGHT upper, RIGHT lower, LEFT upper, and LEFT lower extremity, improved attention, improved awareness, and improved coordination.  Patient to discharge at [facility] Assist level.  Patient's care partner is independent to provide the necessary physical and cognitive assistance at discharge.    Reasons goals not met: n/a   Recommendation:  Patient will benefit from ongoing skilled OT services in home health setting to continue to advance functional skills in the area of BADL, iADL, and Reduce care partner burden.  Equipment: LH sponge, 3 in 1 commode   Reasons for discharge: treatment goals met  Patient/family agrees with progress made and goals achieved: Yes  OT Discharge Precautions/Restrictions  Precautions Precautions: Fall Precaution Comments: L lower abd drain Restrictions Weight Bearing Restrictions: No LLE Weight Bearing: Weight bearing as tolerated  Pain Pain Assessment Pain Scale: 0-10 Pain Score: 0-No pain Multiple Pain Sites: No ADL ADL Equipment Provided: Long-handled sponge Eating: Modified independent Where Assessed-Eating: Wheelchair Grooming: Modified independent Where Assessed-Grooming: Sitting at sink Upper Body Bathing: Supervision/safety Where Assessed-Upper Body Bathing: Sitting at sink Lower Body Bathing: Contact guard Where Assessed-Lower Body Bathing: Standing at sink, Sitting at sink Upper Body Dressing: Modified independent (Device) Where Assessed-Upper Body Dressing: Sitting at sink Lower Body Dressing: Contact guard Where Assessed-Lower Body Dressing: Sitting at  sink, Standing at sink Toileting: Contact guard Where Assessed-Toileting: Bedside Commode, Toilet Toilet Transfer: Furniture conservator/restorer Method: Stand pivot, Proofreader: Bedside commode, Grab bars Tub/Shower Transfer: Scientific laboratory technician Method: Engineer, technical sales: Insurance underwriter: Administrator, arts Method: Designer, industrial/product: Emergency planning/management officer ADL Comments: fatigue continues to limit pt's performance however significant improvment since eval and son Family Educ completed Vision Baseline Vision/History: 1 Wears glasses Patient Visual Report: Blurring of vision Vision Assessment?: Yes Visual Fields: No apparent deficits Perception  Perception: Within Functional Limits Praxis Praxis: WFL Cognition Cognition Overall Cognitive Status: Impaired/Different from baseline Arousal/Alertness: Awake/alert Orientation Level: Person;Place;Situation Memory: Impaired Memory Impairment: Decreased recall of new information;Decreased short term memory Decreased Short Term Memory: Verbal basic;Functional basic Awareness: Appears intact Problem Solving: Impaired Problem Solving Impairment: Functional complex Safety/Judgment: Appears intact Comments: low activity tolerance impacts cognitive performance Brief Interview for Mental Status (BIMS) Repetition of Three Words (First Attempt): 3 Temporal Orientation: Year: Correct Temporal Orientation: Month: Accurate within 5 days Temporal Orientation: Day: Correct Recall: "Sock": Yes, no cue required Recall: "Blue": Yes, after cueing ("a color") Recall: "Bed": Yes, after cueing ("a piece of furniture") BIMS Summary Score: 13 Sensation Sensation Light Touch: Appears Intact Hot/Cold: Appears Intact Proprioception: Appears Intact Stereognosis: Appears Intact Coordination Gross Motor Movements are Fluid and Coordinated:  No Fine Motor Movements are Fluid and Coordinated: Yes Coordination and Movement Description: diminished overall due to weakness and debility Finger Nose Finger Test: slowed but intact 9 Hole Peg Test: 26 sec R hand 32 sec L hand Motor  Motor Motor: Within Functional Limits Motor - Skilled Clinical Observations: Generalized weakness Mobility  Bed Mobility Bed Mobility: Sit to Supine;Supine to Sit;Right Sidelying to Sit;Rolling Right;Rolling Left Rolling Right: Independent with assistive device Rolling Left: Independent with  assistive device Right Sidelying to Sit: Supervision/Verbal cueing Supine to Sit: Supervision/Verbal cueing Sit to Supine: Supervision/Verbal cueing Transfers Sit to Stand: Supervision/Verbal cueing Stand to Sit: Supervision/Verbal cueing  Trunk/Postural Assessment  Cervical Assessment Cervical Assessment: Exceptions to [facility] Thoracic Assessment Thoracic Assessment: Exceptions to [facility] Lumbar Assessment Lumbar Assessment: Exceptions to [facility] Postural Control Postural Control: Deficits on evaluation  Balance Balance Balance Assessed: Yes Static Sitting Balance Static Sitting - Balance Support: Feet supported Static Sitting - Level of Assistance: 6: Modified independent (Device/Increase time) Dynamic Sitting Balance Dynamic Sitting - Balance Support: Feet supported Dynamic Sitting - Level of Assistance: 5: Stand by assistance Static Standing Balance Static Standing - Balance Support: Bilateral upper extremity supported Static Standing - Level of Assistance: 5: Stand by assistance Dynamic Standing Balance Dynamic Standing - Balance Support: During functional activity Dynamic Standing - Level of Assistance: 5: Stand by assistance Extremity/Trunk Assessment RUE Assessment General Strength Comments: 42 lbs grip LUE Assessment LUE Assessment: Within Functional Limits General Strength Comments: 38 lbs   OT Treatment/Intervention:   Pt seen for final OT  session. D/c confirmed with MSW for tomorrow thus final treatment session for OT. Pt up in w/c with gown over shirt and incontinence brief only. OT provided instruction for LE dressing with reacher training sit to stand with cues for forward weight shift to RW for support during dynamic pants management. Seated and standing level oral care. See final levels for details. Self propelled 50 ft with S in hallway to ADL apartment. Stood 4 intervals up 2 min for functional groceries sequencing and reaching task with seated rests. Once back in room, pt only able to recall 2/4 activities done in session.  Reinstructed in B UE HEP with written info provided in d/c notebook as well as BORG scale for home carryover. Completed all CIR goals and treatment this session with no further OT needs in rehab. Rec HHOT and 24 hr S and support from family. Left pt w/c level with chair alarm, needs and nurse call button in reach.     Vicenta Dunning

## 2022-11-13 NOTE — Progress Notes (Signed)
Speech Language Pathology Discharge Summary  Patient Details  Name: Kim Burns MRN: [id number] Date of Birth: 09/21/1935  Date of Discharge from SLP service:November 14, 2022  Patient has met 3 of 3 long term goals.  Patient to discharge at overall Supervision;Min level.   Reasons goals not met: N/A   Clinical Impression/Discharge Summary: Patient has made functional gains and has met 3 of 3 LTGs this admission. Currently, patient demonstrates behaviors consistent with a Rancho Level VII and requires overall supervision level verbal cues and extra time to complete functional and familiar tasks safely in regard to problem solving and recall with use of strategies.  Patient's overall cognitive functioning can fluctuate at times due to pain, dizziness, and fatigue. Patient and family education is complete and patient will discharge home with 24 hour supervision from family. Patient would benefit from f/u SLP services to maximize her cognitive functioning and overall functional independence in order to reduce caregiver burden.   Care Partner:  Caregiver Able to Provide Assistance: Yes  Type of Caregiver Assistance: Physical;Cognitive  Recommendation:  Home Health SLP;24 hour supervision/assistance  Rationale for SLP Follow Up: Maximize cognitive function and independence;Reduce caregiver burden   Equipment: N/A   Reasons for discharge: Treatment goals met;Discharged from hospital   Patient/Family Agrees with Progress Made and Goals Achieved: Yes    Rodel Glaspy 11/13/2022, 6:24 AM

## 2022-11-13 NOTE — Progress Notes (Signed)
Physical Therapy Session Note  Patient Details  Name: Kim Burns MRN: [id number] Date of Birth: 03/28/1935  Today's Date: 11/13/2022 PT Individual Time: 1102-1200 PT Individual Time Calculation (min): 58 min   Short Term Goals: Week 2:  PT Short Term Goal 1 (Week 2): = LTGs due to ELOS  Skilled Therapeutic Interventions/Progress Updates:     Pt received seated in [facility] and is verbally frustrated about having to sit in [facility] and not being assisted back to bed. PT provides education on importance of remaining out of bed to improve endurance. Pt does not complaint of pain. WC transport to gym. Pt requires minA for sit to stand initially from [facility], with facilitation of anterior weight shifting and hand placement. Remaining sit to stand transfers during session with close supervision and similar verbal cues.  Pt performs car transfer with cues for sequencing and positioning, then completes ramp navigation with cues for safe AD management and maintaining puright gaze to improve posture and balance. Pt ambulates x50' with same assistance and cues. Following rest break, pt completes x4 6" steps with bilateral handrails, with CGA and cues for body mechanics, hand placement, and step sequencing. Pt requires extended seated rest breaks between each mobility task, demonstrating significant fatigue. WC transport back to room. Stand step to bed with CGA and cues for positioning. ModA management of BLEs for sit to supine. Pt left supine with all needs within reach.   Therapy Documentation Precautions:  Precautions Precautions: Fall Precaution Comments: L lower abd drain Restrictions Weight Bearing Restrictions: No LLE Weight Bearing: Weight bearing as tolerated   Therapy/Group: Individual Therapy  Beau Fanny, PT, DPT 11/13/2022, 4:31 PM

## 2022-11-13 NOTE — Progress Notes (Signed)
[facility] Progress Note   Subjective: Seen in room. Working with SLP. Wants something for cough. Dialysis today.   Objective Vitals:   11/11/22 1917 11/12/22 1247 11/12/22 1931 11/13/22 0521  BP: 129/65 (!) 140/64 132/69 (!) 173/84  Pulse: (!) 58 (!) 58 (!) 59 68  Resp: 15 17 18 16   Temp: 97.9 F (36.6 C) (!) 97.5 F (36.4 C) 98.5 F (36.9 C) 98.1 F (36.7 C)  TempSrc:      SpO2: 100% 100% 100% 99%  Height:       Physical Exam General: Frail, nad Heart: S1,S2 RRR Lungs: Clear, normal wob  Abdomen: Soft non-tender  Extremities: No LE edema Dialysis Access: L AVF + T/B LUE edema improving     Additional Objective Labs: Basic Metabolic Panel: Recent Labs  Lab 11/07/22 0054 11/11/22 1200 11/13/22 0745  NA 130* 129* 131*  K 3.8 4.3 3.9  CL 94* 94* 93*  CO2 27 22 24   GLUCOSE 125* 142* 141*  BUN 24* 49* 42*  CREATININE 2.42* 4.03* 3.80*  CALCIUM 8.4* 9.0 9.4  PHOS  --  6.1* 5.7*   Liver Function Tests: Recent Labs  Lab 11/11/22 1200 11/13/22 0745  ALBUMIN 2.1* 2.1*   No results for input(s): "LIPASE", "AMYLASE" in the last 168 hours. CBC: Recent Labs  Lab 11/11/22 1200 11/13/22 0745  WBC 10.3 8.1  HGB 11.1* 10.4*  HCT 34.7* 31.4*  MCV 96.4 95.4  PLT 191 188   Blood Culture    Component Value Date/Time   SDES BLOOD RIGHT ANTECUBITAL 01/22/2022 0038   SPECREQUEST  01/22/2022 0038    BOTTLES DRAWN AEROBIC AND ANAEROBIC Blood Culture results may not be optimal due to an inadequate volume of blood received in culture bottles   CULT  01/22/2022 0038    NO GROWTH 5 DAYS Performed at [facility], [address], [place], [place] [address]    REPTSTATUS 01/27/2022 FINAL 01/22/2022 0038    Cardiac Enzymes: No results for input(s): "CKTOTAL", "CKMB", "CKMBINDEX", "TROPONINI" in the last 168 hours. CBG: Recent Labs  Lab 11/12/22 0553 11/12/22 1137 11/12/22 1656 11/12/22 2105 11/13/22 0619  GLUCAP 152* 189* 131* 161* 119*    Iron Studies: No results for input(s): "IRON", "TIBC", "TRANSFERRIN", "FERRITIN" in the last 72 hours. @lablastinr3 @ Studies/Results: No results found. Medications:  anticoagulant sodium citrate      (feeding supplement) PROSource Plus  30 mL Oral BID BM   acetaminophen  500 mg Oral TID   amantadine  100 mg Oral Weekly   amiodarone  200 mg Oral Daily   aspirin  81 mg Oral Daily   benzonatate  100 mg Oral TID   Chlorhexidine Gluconate Cloth  6 each Topical Q0600   doxercalciferol  6 mcg Intravenous Q M,W,F-HD   guaiFENesin  1,200 mg Oral BID   insulin aspart  0-5 Units Subcutaneous QHS   insulin aspart  0-6 Units Subcutaneous TID WC   lidocaine  1 patch Transdermal Q24H   lipase/protease/amylase  36,000 Units Oral BID WC   metoprolol succinate  25 mg Oral Daily   rOPINIRole  0.5 mg Oral QHS   sevelamer carbonate  800 mg Oral TID WC   valACYclovir  500 mg Oral QHS     Dialysis Orders: MWF at [facility] 3.5hr, 350/A1.5, EDW 55kg, 2K/2C bath, L AVF - no heparin - Mircera was on hold at time of admit - Hectoral 6 mcg IV q HD   Assessment/Plan: Head injury/fall from bed ->  R frontal hemorrhagic contusion, R frontal ischemic CVA, small B SDHs, L posterior scalp hematoma, non-displaced L parietal-occipital bone fracture: Now in CIR ESRD: Continue HD on usual MWF schedule - next HD 9/25 HTN/volume: BP fine but ongoing L arm and BLE edema on exam. UF as tolerated. Currently under OP EDW.  Dialysis access: Worsening LUE edema raising concern for stenosis.  Less concerning for infection. Appreciate VVS evaluation - no need for intervention at this time.  Anemia of ESRD: Hgb 11.3, no ESA at this time. Secondary hyperparathyroidism:  CorrCa/Phos accceptable - continue Renvela + VDRA. Nutrition: Alb low, continue supplements.  Tomasa Blase PA-C [facility] 11/13/2022,10:10 AM

## 2022-11-13 NOTE — Progress Notes (Signed)
Occupational Therapy Session Note  Patient Details  Name: Kim Burns MRN: [id number] Date of Birth: 07-Apr-1935  Today's Date: 11/13/2022 OT Individual Time:[phone] 75 min       Short Term Goals: Week 2:  OT Short Term Goal 1 (Week 2): STG=LTG's d/t LOS  Skilled Therapeutic Interventions/Progress Updates:   Pt seen for skilled OT session with focus on functional self care and mobility. Pt bed level upon OT arrival. Applied TEDS in supine but then pt completed pants mngt and tennis shoes at EOB with set up with reacher and [facility]. RW amb with close S to sink to w/c for UB self care with set up. Increased time and effort due to fatigue with education for energy conservation and safety. Cues for hand placement and forward weight shift for sit to stand from all surfaces. Pt completed simple UE tband for triceps press 3 sets of 10 reps with mod cues and encouragement. Left pt w/c level with chair alarm, needs and nurse call button in reach.   Discharge pending due to HD set up. D/c started and placed in incomplete.   Pain: denies pain    Therapy Documentation Precautions:  Precautions Precautions: Fall Precaution Comments: L lower abd drain Restrictions Weight Bearing Restrictions: No LLE Weight Bearing: Weight bearing as tolerated   Therapy/Group: Individual Therapy  Vicenta Dunning 11/13/2022, 3:31 PM

## 2022-11-13 NOTE — Progress Notes (Signed)
PROGRESS NOTE   Subjective/Complaints:  Pt with dry cough this morning. Frustrated by it. Better if she sips on liquid. More awake  ROS: Patient denies fever, rash, sore throat, blurred vision, dizziness, nausea, vomiting, diarrhea,  shortness of breath or chest pain, joint or back/neck pain, headache, or mood change.      Objective:   No results found. Recent Labs    11/11/22 1200 11/13/22 0745  WBC 10.3 8.1  HGB 11.1* 10.4*  HCT 34.7* 31.4*  PLT 191 188    Recent Labs    11/11/22 1200 11/13/22 0745  NA 129* 131*  K 4.3 3.9  CL 94* 93*  CO2 22 24  GLUCOSE 142* 141*  BUN 49* 42*  CREATININE 4.03* 3.80*  CALCIUM 9.0 9.4     Intake/Output Summary (Last 24 hours) at 11/13/2022 0857 Last data filed at 11/13/2022 6644 Gross per 24 hour  Intake 840 ml  Output --  Net 840 ml     Pressure Injury 10/21/22 Buttocks Right Deep Tissue Pressure Injury - Purple or maroon localized area of discolored intact skin or blood-filled blister due to damage of underlying soft tissue from pressure and/or shear. 2 areas of deep tissue pressure in (Active)  10/21/22 1145  Location: Buttocks  Location Orientation: Right  Staging: Deep Tissue Pressure Injury - Purple or maroon localized area of discolored intact skin or blood-filled blister due to damage of underlying soft tissue from pressure and/or shear.  Wound Description (Comments): 2 areas of deep tissue pressure injuries  Present on Admission: Yes    Physical Exam: Vital Signs Blood pressure (!) 173/84, pulse 68, temperature 98.1 F (36.7 C), resp. rate 16, height 5\' 3"  (1.6 m), weight 54.5 kg, SpO2 99%.       Constitutional: No distress . Vital signs reviewed. HEENT: NCAT, EOMI, oral membranes moist Neck: supple Cardiovascular: RRR without murmur. No JVD    Respiratory/Chest: CTA Bilaterally without wheezes or rales. Normal effort. Dry cough  GI/Abdomen: BS +,  non-tender, non-distended Ext: no clubbing, cyanosis, or edema Psych: pleasant and cooperative  Skin: area  below AVF-- improved.  Right buttock wound. Nephrostomy tube/drain in place with brown drainage present.  Neuro:  awake and alert.    Functional Memory. Normal language and speech. Cranial nerve exam unremarkable. MMT: RUE 4/5, LUE 4/5. RLE 3/5 prox to 4/5 distally. LLE 1/5 hip to 4/5 ADF/PF. Sensory exam normal for light touch and pain in all 4 limbs. No nystagmus Musculoskeletal: Left hip remains somewhat tender with palpation and with movement.     Assessment/Plan: 1. Functional deficits which require 3+ hours per day of interdisciplinary therapy in a comprehensive inpatient rehab setting. Physiatrist is providing close team supervision and 24 hour management of active medical problems listed below. Physiatrist and rehab team continue to assess barriers to discharge/monitor patient progress toward functional and medical goals  Care Tool:  Bathing    Body parts bathed by patient: Right arm, Chest, Left arm, Abdomen, Front perineal area, Buttocks, Face, Right upper leg, Left upper leg   Body parts bathed by helper: Right lower leg, Left lower leg     Bathing assist Assist Level: Moderate Assistance -  Patient 50 - 74%     Upper Body Dressing/Undressing Upper body dressing   What is the patient wearing?: Pull over shirt    Upper body assist Assist Level: Contact Guard/Touching assist    Lower Body Dressing/Undressing Lower body dressing      What is the patient wearing?: Pants, Incontinence brief     Lower body assist Assist for lower body dressing: Moderate Assistance - Patient 50 - 74%     Toileting Toileting Toileting Activity did not occur Press photographer and hygiene only): N/A (no void or bm)  Toileting assist Assist for toileting: Minimal Assistance - Patient > 75%     Transfers Chair/bed transfer  Transfers assist  Chair/bed transfer activity did  not occur: Safety/medical concerns (due to dizziness)  Chair/bed transfer assist level: Contact Guard/Touching assist     Locomotion Ambulation   Ambulation assist      Assist level: Contact Guard/Touching assist Assistive device: Walker-rolling Max distance: 75'   Walk 10 feet activity   Assist     Assist level: Contact Guard/Touching assist Assistive device: Walker-rolling   Walk 50 feet activity   Assist Walk 50 feet with 2 turns activity did not occur: Safety/medical concerns  Assist level: Contact Guard/Touching assist Assistive device: Walker-rolling    Walk 150 feet activity   Assist Walk 150 feet activity did not occur: Safety/medical concerns  Assist level: Contact Guard/Touching assist Assistive device: Walker-rolling    Walk 10 feet on uneven surface  activity   Assist     Assist level: Minimal Assistance - Patient > 75% Assistive device: Other (comment) (2 handrails)   Wheelchair     Assist Is the patient using a wheelchair?: Yes Type of Wheelchair: Manual    Wheelchair assist level: Dependent - Patient 0% Max wheelchair distance: 150'    Wheelchair 50 feet with 2 turns activity    Assist        Assist Level: Dependent - Patient 0%   Wheelchair 150 feet activity     Assist          Blood pressure (!) 173/84, pulse 68, temperature 98.1 F (36.7 C), resp. rate 16, height 5\' 3"  (1.6 m), weight 54.5 kg, SpO2 99%.  Medical Problem List and Plan: 1. Functional deficits secondary to TBI and polytrauma due to a fall with associated Croop contrecoup brain injury, right frontal lobe hemorrhagic contusion and right frontal ischemic nonhemorrhagic infarct.             -patient may shower             -ELOS/Goals: extending stay to 9/24 potentially, PT/OT/SLP mod I to supervision             -Continue CIR therapies including PT, OT, and SLP   -pt is going to live with son for time being based on family ed yesterday. Pt's  energy level and fatigue can flucutate 2.  Antithrombotics: -DVT/anticoagulation:  Mechanical: Sequential compression devices, below knee Bilateral lower extremities             -antiplatelet therapy: ASA 3. Pain Management: Tylenol prn 9/23-   HA- given tylenol .  4. Mood/Behavior/Sleep: LCSW to follow for evaluation and support.              -antipsychotic agents: N/A             --chronic insomnia. Napping during day creates a problem  -haven't seen a dramatic difference with amantadine. Stop 9/25 5. Neuropsych/cognition: This patient  is not fully capable of making decisions on her own behalf. 6. Skin/Wound Care: Routine pressure relief measures.  7. Fluids/Electrolytes/Nutrition: Strict I/O. Change to renal diet. Continue 1500 cc FR. -hypokalemia--per renal team for replacement  8. A fib w/RVR/Acute systolic CHF: Fluid overload due to A fib and managed with HD.              --Duo nebs prn added   for SOB? 1500 cc FR/monitor weights daily.              --monitor HR TID--on amiodarone taper. Continue metoprolol  -still no weights. Await today's reading [employer]       9. HTN: Monitor BP TID--on metoprolol bid.  Monitor with mobility  9/18 controlled  9/21- BP somewhat elevated 150s- will monitor for trend- had been controlled. 9/25 fair control with occasional fluctuation 10. ESRD: HD MWF at the end of the day to help with tolerance of therapy             --hx of intradialysis hypotension (midodrine in the past)   -AVF site intact 11. Pancreatic insufficiency s/p Whipple: Continue Creon with meals  BID ac             --has dumping syndrome (abdominal pain with meals followed by diarrhea at times) 12. Left pubic rami/Sacral Ala at S4: WBAT LLE per Dr. Thad Ranger. Follow up Xrays in 2 weeks. 13. L-renal mass (likely RCC) urine leak s/p chronic nephrostomy tube: Tube exchanged out on 09/03 by IR per protocol             --not on AC due to bleeding from nephrostomy tube/kidney.   Measure output daily.   14. T2DM diet controlled w/peripheral neuropathy: Monitor BS ac/hs and use SSI for elevated BS.  --Hgb A1C- 6.1. will add CM restrictions.  CBG (last 3)  Recent Labs    11/12/22 1656 11/12/22 2105 11/13/22 0619  GLUCAP 131* 161* 119*   9/24--continue diet control. Reasonable readings at present 15. Likely BPPV--  some improvement               --an orthostatic component also      -  meclizine  change to prn  9/25 sx  better  16. Anemia of CKD: Monitor with serial checks. H/H stable in 10 range.    17. H/o Shingles w/optic nerve involvement: Continue Valtrex.  18. Restless leg syndrome: Continue Requip 0.5 mg HS with additional 0.25 mg prn during the day.     19. Bradycardia: resolved, continue to monitor HR TID  20. Orthostasis: continue teds with therapy only   -resting bp's better with activity.   22. L forearm swelling and redness-much improved  -continue to elevate LUE as possible   LOS: 14 days A FACE TO FACE EVALUATION WAS PERFORMED  Ranelle Oyster 11/13/2022, 8:57 AM

## 2022-11-13 NOTE — Progress Notes (Signed)
Patient ID: Kim Burns, female   DOB: March 15, 1935, 87 y.o.   MRN: [id number]  SW spoke with pt son to follow-up about discharge plan to his home ([address] [place], [place] [address]). He confirms. SW informed pending dialysis clinic accepting and HHA. He is aware pt may not leave tomorrow. SW updated medical team.  [facility] is pending if branch in area can accept. *referral accepted for [facility] for PT/OT only. Medical team aware no SLP in area.   1520-SW spoke with pt son Kim Burns to inform still waiting on updates from dialysis clinic on if able to accept. SW will update as information is available.   Cecile Sheerer, MSW, LCSWA Office: [phone] Cell: [phone] Fax: [phone]

## 2022-11-14 ENCOUNTER — Other Ambulatory Visit (HOSPITAL_COMMUNITY): Payer: Self-pay

## 2022-11-14 ENCOUNTER — Inpatient Hospital Stay (HOSPITAL_COMMUNITY): Payer: Medicare PPO

## 2022-11-14 ENCOUNTER — Encounter (HOSPITAL_COMMUNITY): Payer: Self-pay

## 2022-11-14 LAB — BASIC METABOLIC PANEL
Anion gap: 13 (ref 5–15)
BUN: 23 mg/dL (ref 8–23)
CO2: 21 mmol/L — ABNORMAL LOW (ref 22–32)
Calcium: 9 mg/dL (ref 8.9–10.3)
Chloride: 97 mmol/L — ABNORMAL LOW (ref 98–111)
Creatinine, Ser: 2.66 mg/dL — ABNORMAL HIGH (ref 0.44–1.00)
GFR, Estimated: 17 mL/min — ABNORMAL LOW (ref >60)
Glucose, Bld: 85 mg/dL (ref 70–99)
Potassium: 4.6 mmol/L (ref 3.5–5.1)
Sodium: 131 mmol/L — ABNORMAL LOW (ref 135–145)

## 2022-11-14 LAB — GLUCOSE, CAPILLARY
Glucose-Capillary: 98 mg/dL (ref 70–99)
Glucose-Capillary: 93 mg/dL (ref 70–99)
Glucose-Capillary: 182 mg/dL — ABNORMAL HIGH (ref 70–99)
Glucose-Capillary: 170 mg/dL — ABNORMAL HIGH (ref 70–99)

## 2022-11-14 NOTE — Progress Notes (Signed)
Occupational Therapy Session Note  Patient Details  Name: Kim Burns MRN: [id number] Date of Birth: 12/23/1935  Today's Date: 11/14/2022 OT Individual Time: 6962-9528 OT Individual Time Calculation (min): 71 min    Short Term Goals: Week 2:  OT Short Term Goal 1 (Week 2): STG=LTG's d/t LOS  Skilled Therapeutic Interventions/Progress Updates:   Pt seen for final skilled OT session this pm. Pt with reports of fatigue from earlier PT sessions but agreeable to address commode transfer with focus on functional STM goal for safety. Pt bed level upon OT arrival. Pt moved EOB with increased time and S. Amb with RW from elevated bed 10 ft to new commode pt will take home. Min cues for hand placement but CGA overall. Pt requested back to bed due to fatigue but agreeable for oral care commode level with interval for mouth rinsing in standing with CGA up to 2 min. Sit to supine with close S. HOB elevated, pt able to complete 15 reps of reacher cane ex for scap, sh, elbow x 2 sets with min cues for recall after 1 min delay/demo. Rests needed between all sets and min cues for breathing integration. Reinforced written UE HEP and had educated son at [facility] for carryover. Left pt with all needs, nurse call button and safety measures in place.   Pain: denies all pain just fatigue   Therapy Documentation Precautions:  Precautions Precautions: Fall Precaution Comments: L lower abd drain Restrictions Weight Bearing Restrictions: No LLE Weight Bearing: Weight bearing as tolerated   Therapy/Group: Individual Therapy  Vicenta Dunning 11/14/2022, 3:34 PM

## 2022-11-14 NOTE — Progress Notes (Signed)
Patient ID: Kim Burns, female   DOB: 10/07/1935, 87 y.o.   MRN: [id number]  SW informed by Tracy/Dialysis Coord still waiting on updates from [facility] on if able to accept pt. SW updated medical team.   Cecile Sheerer, MSW, LCSWA Office: [phone] Cell: [phone] Fax: [phone]

## 2022-11-14 NOTE — Progress Notes (Signed)
[facility] Progress Note   Subjective: Seen in room. No new complaints. No issues with dialysis. Reports discharge tomorrow.   Objective Vitals:   11/13/22 1839 11/13/22 1844 11/13/22 2200 11/14/22 0506  BP: (!) 161/70 (!) 170/68 126/63 (!) 152/82  Pulse: (!) 54 (!) 55 60 70  Resp: 16 14 20 18   Temp: (!) 96.9 F (36.1 C)  98 F (36.7 C) 98.2 F (36.8 C)  TempSrc: Axillary   Oral  SpO2: 100% 100% 100% 100%  Weight:      Height:       Physical Exam General: Frail, nad Heart: S1,S2 RRR Lungs: Clear, normal wob  Abdomen: Soft non-tender  Extremities: No LE edema Dialysis Access: L AVF + T/B LUE edema improving     Additional Objective Labs: Basic Metabolic Panel: Recent Labs  Lab 11/11/22 1200 11/13/22 0745 11/14/22 0644  NA 129* 131* 131*  K 4.3 3.9 4.6  CL 94* 93* 97*  CO2 22 24 21*  GLUCOSE 142* 141* 85  BUN 49* 42* 23  CREATININE 4.03* 3.80* 2.66*  CALCIUM 9.0 9.4 9.0  PHOS 6.1* 5.7*  --    Liver Function Tests: Recent Labs  Lab 11/11/22 1200 11/13/22 0745  ALBUMIN 2.1* 2.1*   No results for input(s): "LIPASE", "AMYLASE" in the last 168 hours. CBC: Recent Labs  Lab 11/11/22 1200 11/13/22 0745  WBC 10.3 8.1  HGB 11.1* 10.4*  HCT 34.7* 31.4*  MCV 96.4 95.4  PLT 191 188   Blood Culture    Component Value Date/Time   SDES BLOOD RIGHT ANTECUBITAL 01/22/2022 0038   SPECREQUEST  01/22/2022 0038    BOTTLES DRAWN AEROBIC AND ANAEROBIC Blood Culture results may not be optimal due to an inadequate volume of blood received in culture bottles   CULT  01/22/2022 0038    NO GROWTH 5 DAYS Performed at [facility], [address], [place], [place] [address]    REPTSTATUS 01/27/2022 FINAL 01/22/2022 0038    Cardiac Enzymes: No results for input(s): "CKTOTAL", "CKMB", "CKMBINDEX", "TROPONINI" in the last 168 hours. CBG: Recent Labs  Lab 11/12/22 2105 11/13/22 0619 11/13/22 1202 11/13/22 2054 11/14/22 0618  GLUCAP 161* 119*  153* 93 98   Iron Studies: No results for input(s): "IRON", "TIBC", "TRANSFERRIN", "FERRITIN" in the last 72 hours. @lablastinr3 @ Studies/Results: No results found. Medications:    (feeding supplement) PROSource Plus  30 mL Oral BID BM   acetaminophen  500 mg Oral TID   amantadine  100 mg Oral Weekly   amiodarone  200 mg Oral Daily   aspirin  81 mg Oral Daily   benzonatate  100 mg Oral TID   Chlorhexidine Gluconate Cloth  6 each Topical Q0600   doxercalciferol  6 mcg Intravenous Q M,W,F-HD   guaiFENesin  1,200 mg Oral BID   insulin aspart  0-5 Units Subcutaneous QHS   insulin aspart  0-6 Units Subcutaneous TID WC   lidocaine  1 patch Transdermal Q24H   lipase/protease/amylase  36,000 Units Oral BID WC   metoprolol succinate  25 mg Oral Daily   rOPINIRole  0.5 mg Oral QHS   sevelamer carbonate  800 mg Oral TID WC   valACYclovir  500 mg Oral QHS     Dialysis Orders: MWF at [facility] 3.5hr, 350/A1.5, EDW 55kg, 2K/2C bath, L AVF - no heparin - Mircera was on hold at time of admit - Hectoral 6 mcg IV q HD   Assessment/Plan: Head injury/fall from bed -> R  frontal hemorrhagic contusion, R frontal ischemic CVA, small B SDHs, L posterior scalp hematoma, non-displaced L parietal-occipital bone fracture: Now in CIR ESRD: Continue HD on usual MWF schedule - next HD 9/27 HTN/volume: BP fine but ongoing L arm and BLE edema on exam. UF as tolerated. Currently under OP EDW.  Dialysis access: Worsening LUE edema raising concern for stenosis. Appreciate VVS evaluation - no need for intervention at this time.  Anemia of ESRD: Hgb 10-11. No ESA at this time. Secondary hyperparathyroidism:  CorrCa/Phos accceptable - continue Renvela + VDRA. Nutrition: Alb low, continue supplements. Dispo: For discharge. Looks like going home with son outside of area and transferring  to [facility] dialysis  Tomasa Blase PA-C [facility] 11/14/2022,9:56 AM

## 2022-11-14 NOTE — Progress Notes (Addendum)
PROGRESS NOTE   Subjective/Complaints:  Pt up in bed. Feels fairly well today! Discussed plans for home with son with me this am  ROS: Patient denies fever, rash, sore throat, blurred vision, dizziness, nausea, vomiting, diarrhea, cough, shortness of breath or chest pain, joint or back/neck pain, headache, or mood change.       Objective:   No results found. Recent Labs    11/11/22 1200 11/13/22 0745  WBC 10.3 8.1  HGB 11.1* 10.4*  HCT 34.7* 31.4*  PLT 191 188    Recent Labs    11/13/22 0745 11/14/22 0644  NA 131* 131*  K 3.9 4.6  CL 93* 97*  CO2 24 21*  GLUCOSE 141* 85  BUN 42* 23  CREATININE 3.80* 2.66*  CALCIUM 9.4 9.0     Intake/Output Summary (Last 24 hours) at 11/14/2022 0914 Last data filed at 11/14/2022 8469 Gross per 24 hour  Intake 600 ml  Output 2000 ml  Net -1400 ml     Pressure Injury 10/21/22 Buttocks Right Deep Tissue Pressure Injury - Purple or maroon localized area of discolored intact skin or blood-filled blister due to damage of underlying soft tissue from pressure and/or shear. 2 areas of deep tissue pressure in (Active)  10/21/22 1145  Location: Buttocks  Location Orientation: Right  Staging: Deep Tissue Pressure Injury - Purple or maroon localized area of discolored intact skin or blood-filled blister due to damage of underlying soft tissue from pressure and/or shear.  Wound Description (Comments): 2 areas of deep tissue pressure injuries  Present on Admission: Yes    Physical Exam: Vital Signs Blood pressure (!) 152/82, pulse 70, temperature 98.2 F (36.8 C), temperature source Oral, resp. rate 18, height 5\' 3"  (1.6 m), weight 52 kg, SpO2 100%.       Constitutional: No distress . Vital signs reviewed. HEENT: NCAT, EOMI, oral membranes moist Neck: supple Cardiovascular: RRR without murmur. No JVD    Respiratory/Chest: CTA Bilaterally without wheezes or rales. Normal effort     GI/Abdomen: BS +, non-tender, non-distended Ext: no clubbing, cyanosis, or edema Psych: pleasant and cooperative  Skin: area  below AVF-- essentially resolved.  Right buttock wound present. Nephrostomy tube/drain in place with brown drainage present.  Neuro:  awake and very alert.    Functional Memory. Normal language and speech. Cranial nerve exam unremarkable. MMT: RUE 4/5, LUE 4/5. RLE 3/5 prox to 4/5 distally. LLE 1/5 hip to 4/5 ADF/PF. Sensory exam normal for light touch and pain in all 4 limbs. No nystagmus Musculoskeletal: Left hip with improved tenderness during ROM   Assessment/Plan: 1. Functional deficits which require 3+ hours per day of interdisciplinary therapy in a comprehensive inpatient rehab setting. Physiatrist is providing close team supervision and 24 hour management of active medical problems listed below. Physiatrist and rehab team continue to assess barriers to discharge/monitor patient progress toward functional and medical goals  Care Tool:  Bathing    Body parts bathed by patient: Right arm, Chest, Left arm, Abdomen, Front perineal area, Buttocks, Face, Right upper leg, Left upper leg, Right lower leg, Left lower leg   Body parts bathed by helper: Right lower leg, Left lower leg  Bathing assist Assist Level: Supervision/Verbal cueing     Upper Body Dressing/Undressing Upper body dressing   What is the patient wearing?: Pull over shirt    Upper body assist Assist Level: Independent with assistive device    Lower Body Dressing/Undressing Lower body dressing      What is the patient wearing?: Pants, Incontinence brief     Lower body assist Assist for lower body dressing: Contact Guard/Touching assist     Toileting Toileting Toileting Activity did not occur (Clothing management and hygiene only): N/A (no void or bm)  Toileting assist Assist for toileting: Contact Guard/Touching assist     Transfers Chair/bed transfer  Transfers assist   Chair/bed transfer activity did not occur: Safety/medical concerns (due to dizziness)  Chair/bed transfer assist level: Contact Guard/Touching assist     Locomotion Ambulation   Ambulation assist      Assist level: Contact Guard/Touching assist Assistive device: Walker-rolling Max distance: 75'   Walk 10 feet activity   Assist     Assist level: Contact Guard/Touching assist Assistive device: Walker-rolling   Walk 50 feet activity   Assist Walk 50 feet with 2 turns activity did not occur: Safety/medical concerns  Assist level: Contact Guard/Touching assist Assistive device: Walker-rolling    Walk 150 feet activity   Assist Walk 150 feet activity did not occur: Safety/medical concerns  Assist level: Contact Guard/Touching assist Assistive device: Walker-rolling    Walk 10 feet on uneven surface  activity   Assist     Assist level: Minimal Assistance - Patient > 75% Assistive device: Other (comment) (2 handrails)   Wheelchair     Assist Is the patient using a wheelchair?: Yes Type of Wheelchair: Manual    Wheelchair assist level: Dependent - Patient 0% Max wheelchair distance: 150'    Wheelchair 50 feet with 2 turns activity    Assist        Assist Level: Dependent - Patient 0%   Wheelchair 150 feet activity     Assist          Blood pressure (!) 152/82, pulse 70, temperature 98.2 F (36.8 C), temperature source Oral, resp. rate 18, height 5\' 3"  (1.6 m), weight 52 kg, SpO2 100%.  Medical Problem List and Plan: 1. Functional deficits secondary to TBI and polytrauma due to a fall with associated Croop contrecoup brain injury, right frontal lobe hemorrhagic contusion and right frontal ischemic nonhemorrhagic infarct.             -patient may shower             -ELOS/Goals: home today potentially acceptance to HD clinic , PT/OT/SLP mod I to supervision            -Continue CIR therapies including PT, OT, and SLP   -pt is going  to live with son for time being based on family ed . Pt's energy level and fatigue can flucutate 2.  Antithrombotics: -DVT/anticoagulation:  Mechanical: Sequential compression devices, below knee Bilateral lower extremities             -antiplatelet therapy: ASA 3. Pain Management:  - HA-   tylenol prn -reports right hip feels better  4. Mood/Behavior/Sleep: LCSW to follow for evaluation and support.              -antipsychotic agents: N/A             --chronic insomnia. Napping during day creates a problem  -stopped amantadine 9/25 5. Neuropsych/cognition: This patient is not  fully capable of making decisions on her own behalf. 6. Skin/Wound Care: Routine pressure relief measures.  7. Fluids/Electrolytes/Nutrition: Strict I/O. Change to renal diet. Continue 1500 cc FR. -hypokalemia--per renal team for replacement  8. A fib w/RVR/Acute systolic CHF: Fluid overload due to A fib and managed with HD.              --Duo nebs prn added   for SOB? 1500 cc FR/monitor weights daily.              --monitor HR TID--on amiodarone taper. Continue metoprolol  -had a weight recorded yesterday. Await today's reading Filed Weights   11/13/22 1030  Weight: 52 kg       9. HTN: Monitor BP TID--on metoprolol bid.  Monitor with mobility  9/18 controlled  9/21- BP somewhat elevated 150s- will monitor for trend- had been controlled. 9/25 fair control with occasional fluctuation 10. ESRD: HD MWF at the end of the day to help with tolerance of therapy             --hx of intradialysis hypotension (midodrine in the past)   -AVF site intact/functioning 11. Pancreatic insufficiency s/p Whipple: Continue Creon with meals  BID ac             --has dumping syndrome (abdominal pain with meals followed by diarrhea at times) 12. Left pubic rami/Sacral Ala at S4: WBAT LLE per Dr. Thad Ranger. Follow up Xrays in 2 weeks.  -will check pelvis xrays today 9/26 13. L-renal mass (likely RCC) urine leak s/p chronic  nephrostomy tube: Tube exchanged out on 09/03 by IR per protocol             --not on AC due to bleeding from nephrostomy tube/kidney.  Measure output daily.   14. T2DM diet controlled w/peripheral neuropathy: Monitor BS ac/hs and use SSI for elevated BS.  --Hgb A1C- 6.1. will add CM restrictions.  CBG (last 3)  Recent Labs    11/13/22 1202 11/13/22 2054 11/14/22 0618  GLUCAP 153* 93 98   9/26--continue diet control. Reasonable readings--f/u with primary 15. Likely BPPV-- much improved  -continue acclimation exercises               --an orthostatic component also      16. Anemia of CKD: Monitor with serial checks. H/H stable in 10 range.    17. H/o Shingles w/optic nerve involvement: Continue Valtrex.  18. Restless leg syndrome: Continue Requip 0.5 mg HS with additional 0.25 mg prn during the day.     19. Bradycardia: resolved, continue to monitor HR TID  20. Orthostasis: continue teds with therapy only   -resting bp's better with activity.     LOS: 15 days A FACE TO FACE EVALUATION WAS PERFORMED  Ranelle Oyster 11/14/2022, 9:14 AM

## 2022-11-14 NOTE — Progress Notes (Signed)
Inpatient Rehabilitation Discharge Medication Review by a Pharmacist  A complete drug regimen review was completed for this patient to identify any potential clinically significant medication issues.  High Risk Drug Classes Is patient taking? Indication by Medication  Antipsychotic No   Anticoagulant No   Antibiotic No   Opioid No   Antiplatelet No   Hypoglycemics/insulin No   Vasoactive Medication Yes Amiodarone - Afib Metoprolol - Afib, HTN  Chemotherapy No   Other Yes Amantadine - mood Benzonatate prn cough Lidocaine patch - pain Ropinirole - RLS Sevelamer - ESRD Creon - supplement Meclizine prn dizziness Valacyclovir - shingles hx     Type of Medication Issue Identified Description of Issue Recommendation(s)  Drug Interaction(s) (clinically significant)     Duplicate Therapy     Allergy     No Medication Administration End Date     Incorrect Dose     Additional Drug Therapy Needed     Significant med changes from prior encounter (inform family/care partners about these prior to discharge).    Other       Clinically significant medication issues were identified that warrant physician communication and completion of prescribed/recommended actions by midnight of the next day:  No  Name of provider notified for urgent issues identified:   Provider Method of Notification:     Pharmacist comments: None  Time spent performing this drug regimen review (minutes):  20 minutes  Thank you Okey Regal, PharmD

## 2022-11-14 NOTE — Progress Notes (Signed)
Physical Therapy Session Note  Patient Details  Name: Kim Burns MRN: [id number] Date of Birth: 19-Oct-1935  Today's Date: 11/14/2022 PT Individual Time: 0903-1001 PT Individual Time Calculation (min): 58 min   Short Term Goals: Week 2:  PT Short Term Goal 1 (Week 2): = LTGs due to ELOS  Skilled Therapeutic Interventions/Progress Updates:     Pt received semi reclined in bed and agrees to therapy. Reports feeling much better than yesterday and in general appears to be in better spirits. No complaint of pain. Supine to sit slowly with bed features and cues for positioning. PT provides totalA to don knee high ted hose, then pt completes bed to chair transfer from elevated bed with RW and cues for sequencing and positioning. WC transport to gym for time management. Pt completes Kinetron for gentle strengthening of BLE posterior chain muscle groups, with cues to perform ON/OFF for 2:00 cycles. Pt completes at resistance of 60 cm/sec with cues for body mechanics and correct performance.   Following rest break, pt ambulates x175' with RW and cues for upright gaze to improve posture and balance, as well as increasing stride length to decrease risk for falls and improve efficiency. Following, pt verbalizes fatigue and requires extended seated rest break. Following extended seated rest break, pt ambulates x100' back to room with same assistance and cues. ModA for sit to supine with cues for logrolling and PT management of BLEs. Left supine with alarm intact and all needs within reach.   Therapy Documentation Precautions:  Precautions Precautions: Fall Precaution Comments: L lower abd drain Restrictions Weight Bearing Restrictions: Yes LLE Weight Bearing: Weight bearing as tolerated   Therapy/Group: Individual Therapy  Beau Fanny, PT, DPT 11/14/2022, 3:39 PM

## 2022-11-14 NOTE — Progress Notes (Signed)
Physical Therapy Session Note  Patient Details  Name: Kim Burns MRN: [id number] Date of Birth: [birth year]  Today's Date: 11/14/2022 PT Individual Time: 1050-1150 PT Individual Time Calculation (min): 60 min  Today's Date: 11/14/2022 PT Missed Time: 15 Minutes Missed Time Reason: Patient fatigue  Short Term Goals: Week 1:  PT Short Term Goal 1 (Week 1): Pt will perform bed mobility with CGA. PT Short Term Goal 1 - Progress (Week 1): Progressing toward goal PT Short Term Goal 2 (Week 1): Pt will perform sit to stand with CGA. PT Short Term Goal 2 - Progress (Week 1): Progressing toward goal PT Short Term Goal 3 (Week 1): PT will perform bed to chair with CGA consistently. PT Short Term Goal 3 - Progress (Week 1): Progressing toward goal PT Short Term Goal 4 (Week 1): Pt will ambulate x50' with CGA and LRAD. PT Short Term Goal 4 - Progress (Week 1): Met Week 2:  PT Short Term Goal 1 (Week 2): = LTGs due to ELOS  Skilled Therapeutic Interventions/Progress Updates: Patient supine in bed on entrance to room. Patient alert and agreeable to PT session.   Patient reported fatigue at beginning of PT session stating "I have another one?" Referring to previous PT session shortly before this session began.  Therapeutic Activity: Bed Mobility: Pt performed supine<sit on EOB with CGA/light minA. VC required for hand placement and sequence. Pt at end of session required modA to elevate B LE's onto bed with VC for sequence, and modA to scoot to [facility] with VC to use B LE's to push into bed for assistance (PTA use of chuck pad).  Transfers: Pt performed sit<>stand pivot transfers throughout session with min/modA to RW. Provided VC for anterior scoot/hand placement and anterior lean for power up.  - Pt required brief to be changed due to sticking tabs being ripped off. Pt in standing while PTA doffed/donned brief with totalA.  Therapeutic Exercise: Pt performed the following exercise to maintain  cardiovascular endurance as pt reported feeling fatigued this session, but willing to work on nustep.  - Pt on Nustep for 10 minutes at level 2 with instructions to maintain above 35 steps per minute. Pt required rest break half-way through with SpO2 and HR monitored (no SOB report, only fatigue; vitals WNL). Pt resumed and would occasionally close eyes and lightly nod off. Pt at end requesting to return to bed due to fatigue. Pt dependently transported back to room in [facility] at that time.   Patient supine in bed at end of session with brakes locked, bed alarm set, and all needs within reach.      Therapy Documentation Precautions:  Precautions Precautions: Fall Precaution Comments: L lower abd drain Restrictions Weight Bearing Restrictions: Yes LLE Weight Bearing: Weight bearing as tolerated  Therapy/Group: Individual Therapy  Courtnie Brenes PTA 11/14/2022, 12:46 PM

## 2022-11-14 NOTE — Progress Notes (Addendum)
Contacted [facility] admissions this morning. Navigator advised awaiting clinic determination. [facility] staff advised pt is for d/c today and need confirmation as soon as possible. Update provided to CSW. Will assist.  Olivia Canter Renal Navigator [phone]  Addendum at 2:50 pm: [facility] admissions. Pt's referral is still pending. Contacted pt's son to provide an update. Update provided to CSW as well.   Addendum at 4:00 pm: Received a call from [facility] admissions to say pt has been accepted at [facility] in [place], [place]. Pt was given a TTS 11:30 chair time and pt can start on Tuesday, Oct 1. Message left for pt's son to discuss these arrangements and to confirm son agreeable to schedule. Will await a return call from pt's son. Update provided to CSW as well.   Addendum at 4:52 pm: Spoke to pt's son and he is agreeable to TTS 11:30 chair time. Pt will need to arrive at 10:45 am on Tuesday, Oct 1 to complete paperwork prior to treatment. After first treatment pt will need to arrive at 11:15 for 11:30 chair time. Son made aware of this info and will add to AVS as well. Update provided to CSW. Unable to update nephrology staff due to being gone for the day. Will provide update in the am.

## 2022-11-14 NOTE — Plan of Care (Signed)
  Problem: RH Problem Solving Goal: LTG Patient will demonstrate problem solving for (SLP) Description: LTG:  Patient will demonstrate problem solving for basic/complex daily situations with cues  (SLP) Outcome: Completed/Met   Problem: RH Memory Goal: LTG Patient will use memory compensatory aids to (SLP) Description: LTG:  Patient will use memory compensatory aids to recall biographical/new, daily complex information with cues (SLP) Outcome: Completed/Met   Problem: RH Memory Goal: LTG Patient will demonstrate ability for day to day (SLP) Description: LTG:   Patient will demonstrate ability for day to day recall/carryover during cognitive/linguistic activities with assist  (SLP) Outcome: Completed/Met

## 2022-11-15 ENCOUNTER — Other Ambulatory Visit (HOSPITAL_COMMUNITY): Payer: Self-pay

## 2022-11-15 LAB — GLUCOSE, CAPILLARY
Glucose-Capillary: 101 mg/dL — ABNORMAL HIGH (ref 70–99)
Glucose-Capillary: 118 mg/dL — ABNORMAL HIGH (ref 70–99)
Glucose-Capillary: 123 mg/dL — ABNORMAL HIGH (ref 70–99)

## 2022-11-15 MED ORDER — LIDOCAINE-PRILOCAINE 2.5-2.5 % EX CREA: 1.0000 | TOPICAL_CREAM | CUTANEOUS | Status: DC | PRN

## 2022-11-15 MED ORDER — HEPARIN SODIUM (PORCINE) 1000 UNIT/ML DIALYSIS: 1000.0000 [IU] | INTRAMUSCULAR | Status: DC | PRN

## 2022-11-15 MED ORDER — ANTICOAGULANT SODIUM CITRATE 4% (200MG/5ML) IV SOLN: 5.0000 mL | Status: DC | PRN

## 2022-11-15 MED ORDER — AMIODARONE HCL 200 MG PO TABS
200.0000 mg | ORAL_TABLET | Freq: Every day | ORAL | 0 refills | Status: DC
Start: 2022-11-15 — End: 2023-06-16
  Filled 2022-11-15: qty 30, 30d supply, fill #0

## 2022-11-15 MED ORDER — PENTAFLUOROPROP-TETRAFLUOROETH EX AERO: 1.0000 | INHALATION_SPRAY | CUTANEOUS | Status: DC | PRN

## 2022-11-15 MED ORDER — LIDOCAINE HCL (PF) 1 % IJ SOLN: 5.0000 mL | INTRAMUSCULAR | Status: DC | PRN

## 2022-11-15 MED ORDER — ALTEPLASE 2 MG IJ SOLR: 2.0000 mg | Freq: Once | INTRAMUSCULAR | Status: DC | PRN

## 2022-11-15 NOTE — Progress Notes (Signed)
Inpatient Rehabilitation Care Coordinator Discharge Note   Patient Details  Name: Kim Burns MRN: [id number] Date of Birth: [birth year]   Discharge location: D/c to her son's home  Length of Stay: 15 days  Discharge activity level: Min Asst  Home/community participation: Limited  Patient response FA:OZHYQM Literacy - How often do you need to have someone help you when you read instructions, pamphlets, or other written material from your doctor or pharmacy?: Never  Patient response VH:QIONGE Isolation - How often do you feel lonely or isolated from those around you?: Never  Services provided included: MD, RD, PT, OT, SLP, TR, Pharmacy, Neuropsych, SW, CM, RN  Financial Services:  Field seismologist Utilized: Private Insurance [employer]  Choices offered to/list presented to: patient son Kim Burns  Follow-up services arranged:  Home Health Home Health Agency: [facility]- Environmental health practitioner for HHPT/OT/aide.         Patient response to transportation need: Is the patient able to respond to transportation needs?: Yes In the past 12 months, has lack of transportation kept you from medical appointments or from getting medications?: No In the past 12 months, has lack of transportation kept you from meetings, work, or from getting things needed for daily living?: No   Patient/Family verbalized understanding of follow-up arrangements:  Yes  Individual responsible for coordination of the follow-up plan: patient son Kim Burns [phone]  Confirmed correct DME delivered: Gretchen Short 11/15/2022    Comments (or additional information):fam edu completed  Summary of Stay    Date/Time Discharge Planning CSW  11/11/22 1120 D/c plan remains to home with support from her son. Pt dtr will come up a few times throughout the year to assist. Fam edu Tues (9/24) 1pm-4pm. Dialysis MWF. SW will confirm there are no barriers to discharge. AAC  11/05/22 0930 Pt will d/c to home with  support from her son. Pt dtr will come up a few times throughout the year to assist. SW will confirm there are no barriers to discharge. AAC       Avontae Burkhead A Lula Olszewski

## 2022-11-15 NOTE — Progress Notes (Signed)
Subjective:  Seen in room sitting up in chair no complaints, aware of discharge and going to son's home in [facility].  HD pending for today on schedule  Objective Vital signs in last 24 hours: Vitals:   11/14/22 0506 11/14/22 1444 11/14/22 1939 11/15/22 0526  BP: (!) 152/82 (!) 147/63 (!) 158/77 (!) 163/75  Pulse: 70 64 64 61  Resp: 18 17 16 18   Temp: 98.2 F (36.8 C) 97.9 F (36.6 C) 98.2 F (36.8 C) 98.2 F (36.8 C)  TempSrc: Oral Oral  Oral  SpO2: 100% 100% 99% 100%  Weight:      Height:       Weight change:   Physical Exam: General: Alert frail elderly female NAD Heart: RRR no MRG appreciated Lungs: CTA bilaterally.  Nonlabored breathing Abdomen: NABS, soft NT Extremities: No pedal edema Dialysis Access: Left arm AV fistula positive bruit  Dialysis Orders: MWF at [facility] 3.5hr, 350/A1.5, EDW 55kg, 2K/2C bath, L AVF - no heparin - Mircera was on hold at time of admit - Hectoral 6 mcg IV q HD   Problem/Plan Head injury/fall from bed -> R frontal hemorrhagic contusion, R frontal ischemic CVA, small B SDHs, L posterior scalp hematoma, non-displaced L parietal-occipital bone fracture: Now in CIR ESRD: Continue HD on usual MWF schedule - next HD today on schedule 9/27 HTN/volume: BP fine but ongoing L arm and BLE edema on exam. UF as tolerated. Currently under OP EDW.  Dialysis access: Worsening LUE edema raising concern for stenosis. Appreciate VVS evaluation - no need for intervention at this time.  Anemia of ESRD: Hgb 10-11. No ESA at this time. Secondary hyperparathyroidism:  CorrCa/Phos accceptable - continue Renvela + VDRA. Nutrition: Alb low, continue supplements. Dispo: For discharge. Looks like going home with son outside of area and transferring  to [facility] for dialysis  Lenny Pastel, PA-C [facility] Beeper [phone] 11/15/2022,1:10 PM  LOS: 16 days   Labs: Basic Metabolic Panel: Recent Labs  Lab 11/11/22 1200  11/13/22 0745 11/14/22 0644  NA 129* 131* 131*  K 4.3 3.9 4.6  CL 94* 93* 97*  CO2 22 24 21*  GLUCOSE 142* 141* 85  BUN 49* 42* 23  CREATININE 4.03* 3.80* 2.66*  CALCIUM 9.0 9.4 9.0  PHOS 6.1* 5.7*  --    Liver Function Tests: Recent Labs  Lab 11/11/22 1200 11/13/22 0745  ALBUMIN 2.1* 2.1*   No results for input(s): "LIPASE", "AMYLASE" in the last 168 hours. No results for input(s): "AMMONIA" in the last 168 hours. CBC: Recent Labs  Lab 11/11/22 1200 11/13/22 0745  WBC 10.3 8.1  HGB 11.1* 10.4*  HCT 34.7* 31.4*  MCV 96.4 95.4  PLT 191 188   Cardiac Enzymes: No results for input(s): "CKTOTAL", "CKMB", "CKMBINDEX", "TROPONINI" in the last 168 hours. CBG: Recent Labs  Lab 11/14/22 1152 11/14/22 1649 11/14/22 2112 11/15/22 0609 11/15/22 1157  GLUCAP 93 182* 170* 101* 118*    Studies/Results: DG Pelvis Comp Min 3V  Result Date: 11/14/2022 CLINICAL DATA:  Pelvic fracture, follow-up examination EXAM: JUDET PELVIS - 3+ VIEW COMPARISON:  10/26/2022 FINDINGS: Minimally displaced left inferior pubic ramus fracture demonstrates normal alignment is unchanged from prior examination. Minimal sclerosis noted involving the superior pubic ramus at the pubic symphysis in keeping with interval healing involving a nondisplaced superior pubic ramus fracture in this location. No interval fracture. Mild-to-moderate bilateral degenerative hip arthritis. Sacroiliac hip joint spaces are preserved. Vascular calcifications are noted within the pelvis and medial  thighs. IMPRESSION: 1. Healing left superior and inferior pubic rami fractures. Electronically Signed   By: Helyn Numbers M.D.   On: 11/14/2022 18:19   Medications:   (feeding supplement) PROSource Plus  30 mL Oral BID BM   acetaminophen  500 mg Oral TID   amantadine  100 mg Oral Weekly   amiodarone  200 mg Oral Daily   aspirin  81 mg Oral Daily   benzonatate  100 mg Oral TID   Chlorhexidine Gluconate Cloth  6 each Topical Q0600    doxercalciferol  6 mcg Intravenous Q M,W,F-HD   guaiFENesin  1,200 mg Oral BID   insulin aspart  0-5 Units Subcutaneous QHS   insulin aspart  0-6 Units Subcutaneous TID WC   lidocaine  1 patch Transdermal Q24H   lipase/protease/amylase  36,000 Units Oral BID WC   metoprolol succinate  25 mg Oral Daily   rOPINIRole  0.5 mg Oral QHS   sevelamer carbonate  800 mg Oral TID WC   valACYclovir  500 mg Oral QHS

## 2022-11-15 NOTE — Plan of Care (Signed)
Patient alert/oriented X4. Patient compliant with medication administration and was up in chair for a few hours. Patient went for hemodialysis treatment today. VSS, will continue to monitor.

## 2022-11-15 NOTE — Progress Notes (Signed)
Patient ID: Kim Burns, female   DOB: [birth year], 87 y.o.   MRN: [id number]  SW spoke with pt son to update on discharge date being 9/30. Prefers tomorrow 9/28 due to his work schedule. States he would like for his mother to discharge by 10am if possible. SW informed medical team.   Cecile Sheerer, MSW, LCSWA Office: [phone] Cell: [phone] Fax: [phone]

## 2022-11-15 NOTE — Progress Notes (Addendum)
Pt to d/c tomorrow per CSW. Update provided to  nephrologist and renal PA this morning regarding pt being on TTS schedule at d/c and can start on Tuesday, 10/1. Contacted [facility] and spoke to [place]. Clinic confirms pt can start on Tuesday and to arrive at 10:45 am for paperwork. Pt's situation discussed with clinic staff. Will fax d/c summary to clinic on Monday once navigator returns to office for continuation of care. Arrangements added to pt's AVS. Spoke to son via phone to confirm all arrangements as well. Contacted [facility] to provide update on plans at d/c.   Olivia Canter Renal Navigator [phone]

## 2022-11-15 NOTE — Progress Notes (Signed)
Physical Therapy Discharge Summary  Patient Details  Name: Kim Burns MRN: [id number] Date of Birth: 1935-06-26  Date of Discharge from PT service:November 15, 2022  Today's Date: 11/15/2022 PT Individual Time: 0802-0858 and 1105-1200 PT Individual Time Calculation (min): 56 min and 55 min  Patient has met 7 of 9 long term goals due to improved activity tolerance, improved balance, improved postural control, increased strength, decreased pain, improved attention, and improved awareness.  Patient to discharge at an ambulatory level Supervision.   Patient's care partner is independent to provide the necessary physical and cognitive assistance at discharge.  Reasons goals not met: Pt continues to require assistance for sit to supine due to LLE weakness. Pt also unable to complete x12 steps due to endurance deficits, but will be discharging to residence with fewer steps to complete.   Recommendation:  Patient will benefit from ongoing skilled PT services in home health setting to continue to advance safe functional mobility, address ongoing impairments in balance, strength, endurance, ambulation, bd mobility, and minimize fall risk.  Equipment: No equipment provided  Reasons for discharge: discharge from hospital  Patient/family agrees with progress made and goals achieved: Yes  Skilled Therapeutic Interventions: 1st Session: Pt received semi reclined in bed and agrees to therapy. Reports pain in LUE that pt attributes to "using it so much the other day". PT provides rest breaks and mobility to manage pain. Pt performs supine to sit slowly with bed features. PT assists to don ted hose and shoes while pt is seated at EOB Pt performs sit to stand with cues for initiation and stand step transfer to [facility] with RW and cues for sequencing and positioning. WC transport to gym. Pt performs kinetron for gentle strengthening of posterior chain muscles Pt completes cycles of 2:00 ON/OFF for total of  8:00 active time. PT provides cues for body mechanics and correct performance. Following rest break, pt performs TUG with following times: 81.0, 50.5. WC transport back to room. Pt left seated in WC with all needs within reach.   2nd Session: Pt received seated in WC asleep. Slow to awaken but is agreeable to therapy and reports pain in LUE. PT provides elevation, rest breaks, and mobility to manage pain. WC transport to gym. Pt requires extended time to arouse due to lethargy. Pt performs sit to stand with minA and cues for hand placement, positioning, sequencing, and initiation. Pt ambulates x175' with RW and close supervision, with cues for upright gaze to improve posture and balance and increasing stride length and gait speed to decrease risk for falls. Additional cueing required for safe RW management and transition to [facility], with cues for eccentric control of stand to sit for safety and strengthening. Pt stands and performs x10 foot taps on 3 inch step with each lower extremity, alternating, with cues for posture and body mechanics. Upon sitting, PT notes that pt has skin tear on lateral aspect of Lt upper arm. RN notified. Pt left seated in WC with all needs within reach.   PT Discharge Precautions/Restrictions Precautions Precautions: Fall Precaution Comments: L lower abd drain Restrictions Weight Bearing Restrictions: Yes LLE Weight Bearing: Weight bearing as tolerated Other Position/Activity Restrictions: use RW per ortho MD Reynolds Pain Interference Pain Interference Pain Effect on Sleep: 1. Rarely or not at all Pain Interference with Therapy Activities: 4. Almost constantly Pain Interference with Day-to-Day Activities: 3. Frequently Vision/Perception  Vision - History Ability to See in Adequate Light: 1 Impaired Perception Perception: Within Functional Limits Praxis Praxis:  WFL  Cognition Overall Cognitive Status: Impaired/Different from baseline Arousal/Alertness:  Awake/alert Orientation Level: Oriented to person;Oriented to place;Oriented to situation;Disoriented to time Day of Week: Correct Attention: Sustained Sustained Attention: Appears intact Memory: Impaired Safety/Judgment: Appears intact Comments: low activity tolerance impacts cognitive performance Ranch[employer] Mirant Scales of Cognitive Functioning: Automatic, Appropriate: Minimal Assistance for Daily Living Skills Sensation Sensation Light Touch: Appears Intact Coordination Gross Motor Movements are Fluid and Coordinated: No Fine Motor Movements are Fluid and Coordinated: Yes Coordination and Movement Description: diminished overall due to weakness and debility Finger Nose Finger Test: slowed but intact Motor  Motor Motor: Within Functional Limits Motor - Skilled Clinical Observations: Generalized weakness  Mobility Bed Mobility Bed Mobility: Sit to Supine;Supine to Sit;Right Sidelying to Sit;Rolling Right;Rolling Left Supine to Sit: Supervision/Verbal cueing Sit to Supine: Moderate Assistance - Patient 50-74% Transfers Transfers: Sit to Stand;Stand to Sit;Stand Pivot Transfers Sit to Stand: Supervision/Verbal cueing Stand to Sit: Supervision/Verbal cueing Stand Pivot Transfer Details: Verbal cues for sequencing;Verbal cues for technique;Verbal cues for safe use of DME/AE Transfer (Assistive device): Rolling walker Locomotion  Gait Ambulation: Yes Gait Assistance: Supervision/Verbal cueing Gait Distance (Feet): 175 Feet Assistive device: Rolling walker Gait Assistance Details: Verbal cues for sequencing;Verbal cues for gait pattern;Verbal cues for technique;Verbal cues for safe use of DME/AE Gait Gait: Yes Gait Pattern: Impaired Gait Pattern: Decreased stride length;Trunk flexed Stairs / Additional Locomotion Stairs: Yes Stairs Assistance: Contact Guard/Touching assist Stair Management Technique: Two rails Number of Stairs: 4 Height of Stairs: 6 Ramp:  Supervision/Verbal cueing Curb: Supervision/Verbal cueing Wheelchair Mobility Wheelchair Mobility: No  Trunk/Postural Assessment  Cervical Assessment Cervical Assessment: Exceptions to [facility] (forward head) Thoracic Assessment Thoracic Assessment: Exceptions to [facility] (rounded shoulders) Lumbar Assessment Lumbar Assessment: Exceptions to [facility] (posterior pelvic tilt) Postural Control Postural Control: Deficits on evaluation (delayed and inadequate)  Balance Standardized Balance Assessment Standardized Balance Assessment: Timed Up and Go Test Timed Up and Go Test TUG: Normal TUG (with RW) Normal TUG (seconds): 65.8 Static Sitting Balance Static Sitting - Balance Support: Feet supported Static Sitting - Level of Assistance: 6: Modified independent (Device/Increase time) Dynamic Sitting Balance Dynamic Sitting - Balance Support: Feet supported Dynamic Sitting - Level of Assistance: 5: Stand by assistance Static Standing Balance Static Standing - Balance Support: Bilateral upper extremity supported Static Standing - Level of Assistance: 5: Stand by assistance Dynamic Standing Balance Dynamic Standing - Balance Support: During functional activity Dynamic Standing - Level of Assistance: 5: Stand by assistance Extremity Assessment  RLE Assessment RLE Assessment: Exceptions to [facility] General Strength Comments: Grossly 4/5 LLE Assessment LLE Assessment: Exceptions to [facility] General Strength Comments: Grossly 4/5   Beau Fanny, PT, DPT 11/15/2022, 4:18 PM
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
## 2022-11-15 NOTE — Plan of Care (Signed)
  Problem: RH Grooming Goal: LTG Patient will perform grooming w/assist,cues/equip (OT) Description: LTG: Patient will perform grooming with assist, with/without cues using equipment (OT) Outcome: Completed/Met   Problem: RH Bathing Goal: LTG Patient will bathe all body parts with assist levels (OT) Description: LTG: Patient will bathe all body parts with assist levels (OT) Outcome: Completed/Met   Problem: RH Dressing Goal: LTG Patient will perform lower body dressing w/assist (OT) Description: LTG: Patient will perform lower body dressing with assist, with/without cues in positioning using equipment (OT) Outcome: Completed/Met   Problem: RH Memory Goal: LTG Patient will demonstrate ability for day to day recall/carry over during activities of daily living with assistance level (OT) Description: LTG:  Patient will demonstrate ability for day to day recall/carry over during activities of daily living with assistance level (OT). Outcome: Completed/Met   Problem: RH Memory Goal: LTG Patient will demonstrate ability for day to day recall/carry over during activities of daily living with assistance level (OT) Description: LTG:  Patient will demonstrate ability for day to day recall/carry over during activities of daily living with assistance level (OT). Outcome: Completed/Met

## 2022-11-15 NOTE — Progress Notes (Addendum)
Pt keep even per Onalee Hua PA ordered d/t pt had pass out during tx because of hypotension 84/57. Hectorol IV given.  11/15/22 1830  Vitals  Temp 98 F (36.7 C)  Temp Source Oral  BP (!) 170/80  BP Location Right Arm  BP Method Automatic  Patient Position (if appropriate) Lying  Resp 16  Oxygen Therapy  O2 Device Room Air  During Treatment Monitoring  Intra-Hemodialysis Comments Tx completed  Post Treatment  Dialyzer Clearance Lightly streaked  Hemodialysis Intake (mL) 300 mL  Liters Processed 60  Fluid Removed (mL) 0 mL  Tolerated HD Treatment Yes  Post-Hemodialysis Comments Pt goal met.  AVG/AVF Arterial Site Held (minutes) 10 minutes  AVG/AVF Venous Site Held (minutes) 10 minutes  Fistula / Graft Left Upper arm Arteriovenous fistula  Placement Date/Time: 04/24/17 1031   Placed prior to admission: No  Orientation: Left  Access Location: Upper arm  Access Type: (c) Arteriovenous fistula  Site Condition No complications  Fistula / Graft Assessment Present;Thrill;Bruit  Status Deaccessed  Needle Size 15  Drainage Description None

## 2022-11-15 NOTE — Progress Notes (Signed)
PROGRESS NOTE   Subjective/Complaints:  Pt up in dayroom with PT. Still has dry cough. Otherwise feeling ok this morning. Going home tomorrow.   ROS: Patient denies fever, rash, sore throat, blurred vision, dizziness, nausea, vomiting, diarrhea, cough, shortness of breath or chest pain, joint or back/neck pain, headache, or mood change.      Objective:   DG Pelvis Comp Min 3V  Result Date: 11/14/2022 CLINICAL DATA:  Pelvic fracture, follow-up examination EXAM: JUDET PELVIS - 3+ VIEW COMPARISON:  10/26/2022 FINDINGS: Minimally displaced left inferior pubic ramus fracture demonstrates normal alignment is unchanged from prior examination. Minimal sclerosis noted involving the superior pubic ramus at the pubic symphysis in keeping with interval healing involving a nondisplaced superior pubic ramus fracture in this location. No interval fracture. Mild-to-moderate bilateral degenerative hip arthritis. Sacroiliac hip joint spaces are preserved. Vascular calcifications are noted within the pelvis and medial thighs. IMPRESSION: 1. Healing left superior and inferior pubic rami fractures. Electronically Signed   By: Helyn Numbers M.D.   On: 11/14/2022 18:19   Recent Labs    11/13/22 0745  WBC 8.1  HGB 10.4*  HCT 31.4*  PLT 188    Recent Labs    11/13/22 0745 11/14/22 0644  NA 131* 131*  K 3.9 4.6  CL 93* 97*  CO2 24 21*  GLUCOSE 141* 85  BUN 42* 23  CREATININE 3.80* 2.66*  CALCIUM 9.4 9.0     Intake/Output Summary (Last 24 hours) at 11/15/2022 0959 Last data filed at 11/14/2022 2300 Gross per 24 hour  Intake 480 ml  Output 130 ml  Net 350 ml     Pressure Injury 10/21/22 Buttocks Right Deep Tissue Pressure Injury - Purple or maroon localized area of discolored intact skin or blood-filled blister due to damage of underlying soft tissue from pressure and/or shear. 2 areas of deep tissue pressure in (Active)  10/21/22 1145   Location: Buttocks  Location Orientation: Right  Staging: Deep Tissue Pressure Injury - Purple or maroon localized area of discolored intact skin or blood-filled blister due to damage of underlying soft tissue from pressure and/or shear.  Wound Description (Comments): 2 areas of deep tissue pressure injuries  Present on Admission: Yes    Physical Exam: Vital Signs Blood pressure (!) 163/75, pulse 61, temperature 98.2 F (36.8 C), temperature source Oral, resp. rate 18, height 5\' 3"  (1.6 m), weight 52 kg, SpO2 100%.       Constitutional: No distress . Vital signs reviewed. HEENT: NCAT, EOMI, oral membranes moist Neck: supple Cardiovascular: RRR without murmur. No JVD    Respiratory/Chest: CTA Bilaterally without wheezes or rales. Normal effort    GI/Abdomen: BS +, non-tender, non-distended Ext: no clubbing, cyanosis, or edema Psych: pleasant and cooperative  Skin: AVF site clean, stable.  Right buttock PI stable. Nephrostomy tube/drain in place with brown drainage present.  Neuro:  alert and oriented x 3    Functional Memory. Normal language and speech. Cranial nerve exam unremarkable. MMT: RUE 4/5, LUE 4/5. RLE 3/5 prox to 4/5 distally. LLE 1/5 hip to 4/5 ADF/PF. Sensory exam normal for light touch and pain in all 4 limbs. No nystagmus Musculoskeletal: Left hip with  improved tenderness during ROM attempts at mobility   Assessment/Plan: 1. Functional deficits which require 3+ hours per day of interdisciplinary therapy in a comprehensive inpatient rehab setting. Physiatrist is providing close team supervision and 24 hour management of active medical problems listed below. Physiatrist and rehab team continue to assess barriers to discharge/monitor patient progress toward functional and medical goals  Care Tool:  Bathing    Body parts bathed by patient: Right arm, Chest, Left arm, Abdomen, Front perineal area, Buttocks, Face, Right upper leg, Left upper leg, Right lower leg, Left  lower leg   Body parts bathed by helper: Right lower leg, Left lower leg     Bathing assist Assist Level: Supervision/Verbal cueing     Upper Body Dressing/Undressing Upper body dressing   What is the patient wearing?: Pull over shirt    Upper body assist Assist Level: Independent with assistive device    Lower Body Dressing/Undressing Lower body dressing      What is the patient wearing?: Pants, Incontinence brief     Lower body assist Assist for lower body dressing: Contact Guard/Touching assist     Toileting Toileting Toileting Activity did not occur (Clothing management and hygiene only): N/A (no void or bm)  Toileting assist Assist for toileting: Contact Guard/Touching assist     Transfers Chair/bed transfer  Transfers assist  Chair/bed transfer activity did not occur: Safety/medical concerns (due to dizziness)  Chair/bed transfer assist level: Contact Guard/Touching assist     Locomotion Ambulation   Ambulation assist      Assist level: Contact Guard/Touching assist Assistive device: Walker-rolling Max distance: 75'   Walk 10 feet activity   Assist     Assist level: Contact Guard/Touching assist Assistive device: Walker-rolling   Walk 50 feet activity   Assist Walk 50 feet with 2 turns activity did not occur: Safety/medical concerns  Assist level: Contact Guard/Touching assist Assistive device: Walker-rolling    Walk 150 feet activity   Assist Walk 150 feet activity did not occur: Safety/medical concerns  Assist level: Contact Guard/Touching assist Assistive device: Walker-rolling    Walk 10 feet on uneven surface  activity   Assist     Assist level: Minimal Assistance - Patient > 75% Assistive device: Other (comment) (2 handrails)   Wheelchair     Assist Is the patient using a wheelchair?: Yes Type of Wheelchair: Manual    Wheelchair assist level: Dependent - Patient 0% Max wheelchair distance: 150'     Wheelchair 50 feet with 2 turns activity    Assist        Assist Level: Dependent - Patient 0%   Wheelchair 150 feet activity     Assist          Blood pressure (!) 163/75, pulse 61, temperature 98.2 F (36.8 C), temperature source Oral, resp. rate 18, height 5\' 3"  (1.6 m), weight 52 kg, SpO2 100%.  Medical Problem List and Plan: 1. Functional deficits secondary to TBI and polytrauma due to a fall with associated Croop contrecoup brain injury, right frontal lobe hemorrhagic contusion and right frontal ischemic nonhemorrhagic infarct.             -patient may shower             -ELOS/Goals: 11/16/22 , PT/OT/SLP mod I to supervision           -Continue CIR therapies including PT, OT, and SLP    -pt is going to live with son for time being based on family ed .  Pt's energy level and fatigue can flucutate  -f/u with me, renal, ortho, gen surgery, pcp as outpt 2.  Antithrombotics: -DVT/anticoagulation:  Mechanical: Sequential compression devices, below knee Bilateral lower extremities             -antiplatelet therapy: ASA 3. Pain Management:  - HA-   tylenol prn -reports right hip feels better  4. Mood/Behavior/Sleep: LCSW to follow for evaluation and support.              -antipsychotic agents: N/A             --chronic insomnia. Napping during day creates a problem  -stopped amantadine 9/25 5. Neuropsych/cognition: This patient is not fully capable of making decisions on her own behalf. 6. Skin/Wound Care: Routine pressure relief measures.  7. Fluids/Electrolytes/Nutrition: Strict I/O. Change to renal diet. Continue 1500 cc FR. -hypokalemia--per renal team for replacement  8. A fib w/RVR/Acute systolic CHF: Fluid overload due to A fib and managed with HD.              --Duo nebs prn added   for SOB? 1500 cc FR/monitor weights daily.              --monitor HR TID--on amiodarone taper. Continue metoprolol   Filed Weights   11/13/22 1030  Weight: 52 kg       9.  HTN: Monitor BP TID--on metoprolol bid.  Monitor with mobility  9/18 controlled  9/21- BP somewhat elevated 150s- will monitor for trend- had been controlled. 9/27 fair control with occasional fluctuation with HD 10. ESRD: HD MWF at the end of the day to help with tolerance of therapy             --hx of intradialysis hypotension (midodrine in the past)   -AVF site intact/functioning 11. Pancreatic insufficiency s/p Whipple: Continue Creon with meals  BID ac             --has dumping syndrome (abdominal pain with meals followed by diarrhea at times) 12. Left pubic rami/Sacral Ala at S4: WBAT LLE per Dr. Thad Ranger. Follow up Xrays in 2 weeks.  - pelvis xrays demonstrate alignment and healing--f/u with Dr. Thad Ranger as outpt 13. L-renal mass (likely RCC) urine leak s/p chronic nephrostomy tube: Tube exchanged out on 09/03 by IR per protocol             --not on AC due to bleeding from nephrostomy tube/kidney.  Measure output daily.   14. T2DM diet controlled w/peripheral neuropathy: Monitor BS ac/hs and use SSI for elevated BS.  --Hgb A1C- 6.1. will add CM restrictions.  CBG (last 3)  Recent Labs    11/14/22 1649 11/14/22 2112 11/15/22 0609  GLUCAP 182* 170* 101*   9/27--carb diet, fair control--f/u with primary 15. Likely BPPV-- much improved  -continue acclimation exercises               --an orthostatic component also      16. Anemia of CKD: Monitor with serial checks. H/H stable in 10 range.    17. H/o Shingles w/optic nerve involvement: Continue Valtrex.  18. Restless leg syndrome: Continue Requip 0.5 mg HS with additional 0.25 mg prn during the day.     19. Bradycardia: resolved, continue to monitor HR TID  20. Orthostasis: continue teds with therapy only   -resting bp's better with activity.     LOS: 16 days A FACE TO FACE EVALUATION WAS PERFORMED  Ranelle Oyster 11/15/2022, 9:59 AM

## 2022-11-16 DIAGNOSIS — I10 Essential (primary) hypertension: Secondary | ICD-10-CM

## 2022-11-16 LAB — GLUCOSE, CAPILLARY: Glucose-Capillary: 81 mg/dL (ref 70–99)

## 2022-11-16 NOTE — Progress Notes (Signed)
Patient discharged to her son's home, accompanied by her son.

## 2022-11-16 NOTE — Progress Notes (Signed)
PROGRESS NOTE   Subjective/Complaints:  Pt doing well, ready to go home! Denies any issues overnight, pain doing ok, LBM just now, urinating fine. Denies any other complaints or concerns. Meds reviewed.   ROS: Patient denies fever, rash, sore throat, blurred vision, dizziness, nausea, vomiting, diarrhea, cough, shortness of breath or chest pain, joint or back/neck pain, headache, or mood change.      Objective:   DG Pelvis Comp Min 3V  Result Date: 11/14/2022 CLINICAL DATA:  Pelvic fracture, follow-up examination EXAM: JUDET PELVIS - 3+ VIEW COMPARISON:  10/26/2022 FINDINGS: Minimally displaced left inferior pubic ramus fracture demonstrates normal alignment is unchanged from prior examination. Minimal sclerosis noted involving the superior pubic ramus at the pubic symphysis in keeping with interval healing involving a nondisplaced superior pubic ramus fracture in this location. No interval fracture. Mild-to-moderate bilateral degenerative hip arthritis. Sacroiliac hip joint spaces are preserved. Vascular calcifications are noted within the pelvis and medial thighs. IMPRESSION: 1. Healing left superior and inferior pubic rami fractures. Electronically Signed   By: Helyn Numbers M.D.   On: 11/14/2022 18:19   No results for input(s): "WBC", "HGB", "HCT", "PLT" in the last 72 hours.   Recent Labs    11/14/22 0644  NA 131*  K 4.6  CL 97*  CO2 21*  GLUCOSE 85  BUN 23  CREATININE 2.66*  CALCIUM 9.0     Intake/Output Summary (Last 24 hours) at 11/16/2022 1042 Last data filed at 11/15/2022 2227 Gross per 24 hour  Intake 725 ml  Output 70 ml  Net 655 ml     Pressure Injury 10/21/22 Buttocks Right Deep Tissue Pressure Injury - Purple or maroon localized area of discolored intact skin or blood-filled blister due to damage of underlying soft tissue from pressure and/or shear. 2 areas of deep tissue pressure in (Active)  10/21/22  1145  Location: Buttocks  Location Orientation: Right  Staging: Deep Tissue Pressure Injury - Purple or maroon localized area of discolored intact skin or blood-filled blister due to damage of underlying soft tissue from pressure and/or shear.  Wound Description (Comments): 2 areas of deep tissue pressure injuries  Present on Admission: Yes    Physical Exam: Vital Signs Blood pressure (!) 159/80, pulse 67, temperature 98.5 F (36.9 C), resp. rate 18, height 5\' 3"  (1.6 m), weight 52.5 kg, SpO2 100%.  Constitutional: No distress . Vital signs reviewed. HEENT: NCAT, EOMI, oral membranes moist Neck: supple Cardiovascular: RRR without murmur. No JVD    Respiratory/Chest: CTA Bilaterally without wheezes or rales. Normal effort    GI/Abdomen: BS +, non-tender, non-distended Ext: no clubbing, cyanosis, or edema Psych: pleasant and cooperative   PRIOR EXAMS: Skin: AVF site clean, stable.  Right buttock PI stable. Nephrostomy tube/drain in place with brown drainage present.  Neuro:  alert and oriented x 3    Functional Memory. Normal language and speech. Cranial nerve exam unremarkable. MMT: RUE 4/5, LUE 4/5. RLE 3/5 prox to 4/5 distally. LLE 1/5 hip to 4/5 ADF/PF. Sensory exam normal for light touch and pain in all 4 limbs. No nystagmus Musculoskeletal: Left hip with improved tenderness during ROM attempts at mobility   Assessment/Plan: 1. Functional deficits  which require 3+ hours per day of interdisciplinary therapy in a comprehensive inpatient rehab setting. Physiatrist is providing close team supervision and 24 hour management of active medical problems listed below. Physiatrist and rehab team continue to assess barriers to discharge/monitor patient progress toward functional and medical goals  Care Tool:  Bathing    Body parts bathed by patient: Right arm, Chest, Left arm, Abdomen, Front perineal area, Buttocks, Face, Right upper leg, Left upper leg, Right lower leg, Left lower leg    Body parts bathed by helper: Right lower leg, Left lower leg     Bathing assist Assist Level: Supervision/Verbal cueing     Upper Body Dressing/Undressing Upper body dressing   What is the patient wearing?: Pull over shirt    Upper body assist Assist Level: Independent with assistive device    Lower Body Dressing/Undressing Lower body dressing      What is the patient wearing?: Pants, Incontinence brief     Lower body assist Assist for lower body dressing: Contact Guard/Touching assist     Toileting Toileting Toileting Activity did not occur (Clothing management and hygiene only): N/A (no void or bm)  Toileting assist Assist for toileting: Contact Guard/Touching assist     Transfers Chair/bed transfer  Transfers assist  Chair/bed transfer activity did not occur: Safety/medical concerns (due to dizziness)  Chair/bed transfer assist level: Supervision/Verbal cueing     Locomotion Ambulation   Ambulation assist      Assist level: Supervision/Verbal cueing Assistive device: Walker-rolling Max distance: 175'   Walk 10 feet activity   Assist     Assist level: Supervision/Verbal cueing Assistive device: Walker-rolling   Walk 50 feet activity   Assist Walk 50 feet with 2 turns activity did not occur: Safety/medical concerns  Assist level: Supervision/Verbal cueing Assistive device: Walker-rolling    Walk 150 feet activity   Assist Walk 150 feet activity did not occur: Safety/medical concerns  Assist level: Supervision/Verbal cueing Assistive device: Walker-rolling    Walk 10 feet on uneven surface  activity   Assist     Assist level: Supervision/Verbal cueing Assistive device: Walker-rolling   Wheelchair     Assist Is the patient using a wheelchair?: No Type of Wheelchair: Manual    Wheelchair assist level: Dependent - Patient 0% Max wheelchair distance: 150'    Wheelchair 50 feet with 2 turns activity    Assist         Assist Level: Dependent - Patient 0%   Wheelchair 150 feet activity     Assist          Blood pressure (!) 159/80, pulse 67, temperature 98.5 F (36.9 C), resp. rate 18, height 5\' 3"  (1.6 m), weight 52.5 kg, SpO2 100%.  Medical Problem List and Plan: 1. Functional deficits secondary to TBI and polytrauma due to a fall with associated Croop contrecoup brain injury, right frontal lobe hemorrhagic contusion and right frontal ischemic nonhemorrhagic infarct.             -patient may shower             -ELOS/Goals: 11/16/22 , PT/OT/SLP mod I to supervision           -Continue CIR therapies including PT, OT, and SLP   -pt is going to live with son for time being based on family ed . Pt's energy level and fatigue can flucutate  -f/u with me, renal, ortho, gen surgery, pcp as outpt  -11/16/22 d/c today, meds reviewed 2.  Antithrombotics: -DVT/anticoagulation:  Mechanical: Sequential compression devices, below knee Bilateral lower extremities             -antiplatelet therapy: ASA 3. Pain Management:  - HA-   tylenol prn -reports right hip feels better  4. Mood/Behavior/Sleep: LCSW to follow for evaluation and support.              -antipsychotic agents: N/A             --chronic insomnia. Napping during day creates a problem  -stopped amantadine 9/25 5. Neuropsych/cognition: This patient is not fully capable of making decisions on her own behalf. 6. Skin/Wound Care: Routine pressure relief measures.  7. Fluids/Electrolytes/Nutrition: Strict I/O. Change to renal diet. Continue 1500 cc FR. -hypokalemia--per renal team for replacement  8. A fib w/RVR/Acute systolic CHF: Fluid overload due to A fib and managed with HD.              --Duo nebs prn added   for SOB? 1500 cc FR/monitor weights daily.              --monitor HR TID--on amiodarone taper. Continue metoprolol   Filed Weights   11/13/22 1030 11/15/22 1541 11/15/22 1856  Weight: 52 kg 52 kg 52.5 kg       9. HTN: Monitor BP  TID--on metoprolol bid.  Monitor with mobility  9/18 controlled  9/21- BP somewhat elevated 150s- will monitor for trend- had been controlled. 9/27 fair control with occasional fluctuation with HD -11/16/22 BPs ok, variable but fairly stable; monitor at home.  Vitals:   11/14/22 1939 11/15/22 0526 11/15/22 1312 11/15/22 1536  BP: (!) 158/77 (!) 163/75 (!) 157/76 (!) 167/77   11/15/22 1600 11/15/22 1630 11/15/22 1700 11/15/22 1730  BP: 137/75 (!) 95/59 (!) 161/76 (!) 157/74   11/15/22 1800 11/15/22 1830 11/15/22 1934 11/16/22 0542  BP: (!) 160/74 (!) 170/80 (!) 181/81 (!) 159/80    10. ESRD: HD MWF at the end of the day to help with tolerance of therapy             --hx of intradialysis hypotension (midodrine in the past)   -AVF site intact/functioning 11. Pancreatic insufficiency s/p Whipple: Continue Creon with meals  BID ac             --has dumping syndrome (abdominal pain with meals followed by diarrhea at times) 12. Left pubic rami/Sacral Ala at S4: WBAT LLE per Dr. Thad Ranger. Follow up Xrays in 2 weeks.  - pelvis xrays demonstrate alignment and healing--f/u with Dr. Thad Ranger as outpt 13. L-renal mass (likely RCC) urine leak s/p chronic nephrostomy tube: Tube exchanged out on 09/03 by IR per protocol             --not on AC due to bleeding from nephrostomy tube/kidney.  Measure output daily.   14. T2DM diet controlled w/peripheral neuropathy: Monitor BS ac/hs and use SSI for elevated BS.  --Hgb A1C- 6.1. will add CM restrictions.   9/27--carb diet, fair control--f/u with primary -11/16/22 CBGs fine, f/up outpatient CBG (last 3)  Recent Labs    11/15/22 1157 11/15/22 2109 11/16/22 0616  GLUCAP 118* 123* 81    15. Likely BPPV-- much improved  -continue acclimation exercises               --an orthostatic component also      16. Anemia of CKD: Monitor with serial checks. H/H stable in 10 range.    17. H/o Shingles w/optic nerve involvement: Continue Valtrex.  18.  Restless  leg syndrome: Continue Requip 0.5 mg HS with additional 0.25 mg prn during the day.     19. Bradycardia: resolved, continue to monitor HR TID  20. Orthostasis: continue teds with therapy only   -resting bp's better with activity.     LOS: 17 days A FACE TO FACE EVALUATION WAS PERFORMED  [address] 11/16/2022, 10:42 AM

## 2022-11-18 ENCOUNTER — Other Ambulatory Visit (HOSPITAL_COMMUNITY): Payer: Self-pay

## 2022-11-18 DIAGNOSIS — I5032 Chronic diastolic (congestive) heart failure: Secondary | ICD-10-CM | POA: Diagnosis not present

## 2022-11-18 DIAGNOSIS — I132 Hypertensive heart and chronic kidney disease with heart failure and with stage 5 chronic kidney disease, or end stage renal disease: Secondary | ICD-10-CM | POA: Diagnosis not present

## 2022-11-18 DIAGNOSIS — N186 End stage renal disease: Secondary | ICD-10-CM | POA: Diagnosis not present

## 2022-11-18 DIAGNOSIS — S3210XD Unspecified fracture of sacrum, subsequent encounter for fracture with routine healing: Secondary | ICD-10-CM | POA: Diagnosis not present

## 2022-11-18 DIAGNOSIS — E1122 Type 2 diabetes mellitus with diabetic chronic kidney disease: Secondary | ICD-10-CM | POA: Diagnosis not present

## 2022-11-18 DIAGNOSIS — S0282XD Fracture of other specified skull and facial bones, left side, subsequent encounter for fracture with routine healing: Secondary | ICD-10-CM | POA: Diagnosis not present

## 2022-11-18 DIAGNOSIS — D631 Anemia in chronic kidney disease: Secondary | ICD-10-CM | POA: Diagnosis not present

## 2022-11-18 DIAGNOSIS — S069X0D Unspecified intracranial injury without loss of consciousness, subsequent encounter: Secondary | ICD-10-CM | POA: Diagnosis not present

## 2022-11-18 DIAGNOSIS — S0003XD Contusion of scalp, subsequent encounter: Secondary | ICD-10-CM | POA: Diagnosis not present

## 2022-11-19 DIAGNOSIS — Z8619 Personal history of other infectious and parasitic diseases: Secondary | ICD-10-CM | POA: Diagnosis not present

## 2022-11-19 DIAGNOSIS — Z992 Dependence on renal dialysis: Secondary | ICD-10-CM | POA: Diagnosis not present

## 2022-11-19 DIAGNOSIS — H5203 Hypermetropia, bilateral: Secondary | ICD-10-CM | POA: Diagnosis not present

## 2022-11-19 DIAGNOSIS — N186 End stage renal disease: Secondary | ICD-10-CM | POA: Diagnosis not present

## 2022-11-19 NOTE — Progress Notes (Signed)
Late Note Entry- November 19, 2022  D/C summary faxed to [facility] this morning for continuation of care.   Olivia Canter Renal Navigator [phone]

## 2022-11-21 DIAGNOSIS — Z992 Dependence on renal dialysis: Secondary | ICD-10-CM | POA: Diagnosis not present

## 2022-11-21 DIAGNOSIS — Z8619 Personal history of other infectious and parasitic diseases: Secondary | ICD-10-CM | POA: Diagnosis not present

## 2022-11-21 DIAGNOSIS — H5203 Hypermetropia, bilateral: Secondary | ICD-10-CM | POA: Diagnosis not present

## 2022-11-21 DIAGNOSIS — N186 End stage renal disease: Secondary | ICD-10-CM | POA: Diagnosis not present

## 2022-11-23 DIAGNOSIS — Z992 Dependence on renal dialysis: Secondary | ICD-10-CM | POA: Diagnosis not present

## 2022-11-23 DIAGNOSIS — N186 End stage renal disease: Secondary | ICD-10-CM | POA: Diagnosis not present

## 2022-11-26 DIAGNOSIS — Z992 Dependence on renal dialysis: Secondary | ICD-10-CM | POA: Diagnosis not present

## 2022-11-26 DIAGNOSIS — N186 End stage renal disease: Secondary | ICD-10-CM | POA: Diagnosis not present

## 2022-11-27 DIAGNOSIS — S0282XD Fracture of other specified skull and facial bones, left side, subsequent encounter for fracture with routine healing: Secondary | ICD-10-CM | POA: Diagnosis not present

## 2022-11-27 DIAGNOSIS — S3210XD Unspecified fracture of sacrum, subsequent encounter for fracture with routine healing: Secondary | ICD-10-CM | POA: Diagnosis not present

## 2022-11-27 DIAGNOSIS — S069X0D Unspecified intracranial injury without loss of consciousness, subsequent encounter: Secondary | ICD-10-CM | POA: Diagnosis not present

## 2022-11-27 DIAGNOSIS — D631 Anemia in chronic kidney disease: Secondary | ICD-10-CM | POA: Diagnosis not present

## 2022-11-27 DIAGNOSIS — S0003XD Contusion of scalp, subsequent encounter: Secondary | ICD-10-CM | POA: Diagnosis not present

## 2022-11-27 DIAGNOSIS — N186 End stage renal disease: Secondary | ICD-10-CM | POA: Diagnosis not present

## 2022-11-27 DIAGNOSIS — I132 Hypertensive heart and chronic kidney disease with heart failure and with stage 5 chronic kidney disease, or end stage renal disease: Secondary | ICD-10-CM | POA: Diagnosis not present

## 2022-11-27 DIAGNOSIS — E1122 Type 2 diabetes mellitus with diabetic chronic kidney disease: Secondary | ICD-10-CM | POA: Diagnosis not present

## 2022-11-27 DIAGNOSIS — I5032 Chronic diastolic (congestive) heart failure: Secondary | ICD-10-CM | POA: Diagnosis not present

## 2022-11-28 ENCOUNTER — Telehealth (HOSPITAL_COMMUNITY): Payer: Self-pay

## 2022-11-28 DIAGNOSIS — N186 End stage renal disease: Secondary | ICD-10-CM | POA: Diagnosis not present

## 2022-11-28 DIAGNOSIS — Z992 Dependence on renal dialysis: Secondary | ICD-10-CM | POA: Diagnosis not present

## 2022-11-28 NOTE — Telephone Encounter (Signed)
Called to move pt's son appt on 10/28 down to 2pm, no answer, no vm. AB

## 2022-11-29 ENCOUNTER — Encounter: Payer: Medicare PPO | Attending: Registered Nurse | Admitting: Registered Nurse

## 2022-11-29 ENCOUNTER — Encounter: Payer: Self-pay | Admitting: Registered Nurse

## 2022-11-29 VITALS — BP 139/49 | HR 56 | Ht 63.0 in | Wt 120.6 lb

## 2022-11-29 DIAGNOSIS — I4891 Unspecified atrial fibrillation: Secondary | ICD-10-CM | POA: Diagnosis not present

## 2022-11-29 DIAGNOSIS — S069X1D Unspecified intracranial injury with loss of consciousness of 30 minutes or less, subsequent encounter: Secondary | ICD-10-CM | POA: Diagnosis not present

## 2022-11-29 DIAGNOSIS — E119 Type 2 diabetes mellitus without complications: Secondary | ICD-10-CM | POA: Diagnosis not present

## 2022-11-29 DIAGNOSIS — Z992 Dependence on renal dialysis: Secondary | ICD-10-CM | POA: Insufficient documentation

## 2022-11-29 DIAGNOSIS — N186 End stage renal disease: Secondary | ICD-10-CM | POA: Diagnosis not present

## 2022-11-29 DIAGNOSIS — S069XAA Unspecified intracranial injury with loss of consciousness status unknown, initial encounter: Secondary | ICD-10-CM | POA: Diagnosis not present

## 2022-11-29 DIAGNOSIS — J309 Allergic rhinitis, unspecified: Secondary | ICD-10-CM | POA: Diagnosis not present

## 2022-11-29 DIAGNOSIS — I10 Essential (primary) hypertension: Secondary | ICD-10-CM | POA: Diagnosis not present

## 2022-11-29 MED ORDER — METOPROLOL SUCCINATE ER 25 MG PO TB24: 25.0000 mg | ORAL_TABLET | Freq: Every day | ORAL | 0 refills | Status: DC

## 2022-11-29 NOTE — Progress Notes (Signed)
Subjective:    Patient ID: Kim Burns, female    DOB: 09-17-1935, 87 y.o.   MRN: [id number]  HPI: Kim Burns is a 87 y.o. female who  is here for HFU appointment for F/U of her TBI, Atrial Fibrillation with RVR, Essential Hypertension and ESRD.  She was brought to [facility] ED on 10/21/2022, after being found down, by her son -in law.    10/21/2022: Kim Fass NP: H&P 45 yo F PMH ESRD on  MWF HD, Afib formerly on AC (dc after spontaneous renal hematoma), HTN, s/p whipple +  (pancreatic mass, no malignancy, high grady dysplasia), RCC s/p embo/ ablation in 2023 c/b urine leak ultimately req L neph rube who was found face down on her kitchen floor by Son in Social worker. Has no memory of falling, sounds like + LOC. At the time that son in law found her she was asleep and woke up easily. Did complain of transient black spots visually.   CT Head: WO Contrast IMPRESSION: 1. Positive for acute coup contrecoup brain injury with nondisplaced left posterior skull fracture, right anterior frontal lobe hemorrhagic contusion. No midline shift or other complicating features. 2. Associated left posterior scalp hematoma.   CT Cervical Spine:  IMPRESSION: 1. No evidence of acute cervical spine fracture, traumatic subluxation or static signs of instability. 2. Mildly progressive multilevel cervical spondylosis. 3. Severe bilateral carotid atherosclerosis.   CT: Lumbar IMPRESSION: 1. Subtle nondisplaced fracture of the anterior cortex of the sacrum at S4. 2. No acute fracture or traumatic listhesis of the lumbar spine. 3. Mild wall thickening of the imaged distal sigmoid colon. Correlate for signs and symptoms of colitis. 4. Aortic atherosclerosis (ICD10-I70.0). DG: Left Hip IMPRESSION: 1. Irregularity of the left inferior pubic ramus may represent a nondisplaced fracture in the appropriate clinical setting. 2. Mild left hip osteoarthritis.   Nephrostomy Tube in place with yellow urine  noted  Neurosurgery, Neurology, and Cardiology consulted   [name] was admitted to inpatient rehabilitation on 10/30/2022 and discharged to her sons home on 11/16/2022. She is receiving Home Health Therapy with [facility]. She reports pain in her left hip. She rates her pain 4. Also reports she has a fair appetite.   Arrived to office in wheelchair, son in room all questions answered.    Pain Inventory Average Pain 5 Pain Right Now 4 My pain is intermittent  LOCATION OF PAIN  Hip Pain (Worse in left than right), Tailbone as well  BOWEL Number of stools per week: 21   BLADDER Dialysis    Mobility walk without assistance use a cane use a walker  Function retired  Neuro/Psych bladder control problems bowel control problems weakness trouble walking dizziness confusion  Prior Studies Any changes since last visit?  no  Physicians involved in your care Any changes since last visit?  no   Family History  Problem Relation Age of Onset   Heart disease Mother    Stroke Mother    Cancer Father        colon   Alzheimer's disease Sister    Alzheimer's disease Sister    Cancer Brother        brain   Breast cancer Neg Hx    Social History   Socioeconomic History   Marital status: Widowed    Spouse name: Not on file   Number of children: Not on file   Years of education: Not on file   Highest education level: Not on file  Occupational  History   Not on file  Tobacco Use   Smoking status: Never   Smokeless tobacco: Never  Vaping Use   Vaping status: Never Used  Substance and Sexual Activity   Alcohol use: No   Drug use: No   Sexual activity: Not on file  Other Topics Concern   Not on file  Social History Narrative   Not on file   Social Determinants of Health   Financial Resource Strain: Not on file  Food Insecurity: No Food Insecurity (11/01/2021)   Hunger Vital Sign    Worried About Running Out of Food in the Last Year: Never true    Ran Out  of Food in the Last Year: Never true  Transportation Needs: No Transportation Needs (11/01/2021)   PRAPARE - Administrator, Civil Service (Medical): No    Lack of Transportation (Non-Medical): No  Physical Activity: Not on file  Stress: Not on file  Social Connections: Not on file   Past Surgical History:  Procedure Laterality Date   A/V FISTULAGRAM Left 08/30/2021   Procedure: A/V Fistulagram;  Surgeon: Cephus Shelling, MD;  Location: [facility] INVASIVE CV LAB;  Service: Cardiovascular;  Laterality: Left;   ABDOMINAL HYSTERECTOMY  1985   complete   BACK SURGERY  1980   lower   BASCILIC VEIN TRANSPOSITION Left 04/24/2017   Procedure: LEFT ARM FIRST STAGE BASILIC VEIN TRANSPOSITION;  Surgeon: Nada Libman, MD;  Location: [facility] OR;  Service: Vascular;  Laterality: Left;   BASCILIC VEIN TRANSPOSITION Left 07/10/2017   Procedure: SECOND STAGE BASILIC VEIN TRANSPOSITION LEFT ARM;  Surgeon: Nada Libman, MD;  Location: [facility] OR;  Service: Vascular;  Laterality: Left;   BREAST LUMPECTOMY Left x 2   many years apart, benign   CHOLECYSTECTOMY  1985   CYSTOSCOPY W/ URETERAL STENT PLACEMENT Left 10/31/2021   Procedure: CYSTOSCOPY WITH RETROGRADE PYELOGRAM/URETERAL STENT PLACEMENT;  Surgeon: Crist Fat, MD;  Location: [facility] OR;  Service: Urology;  Laterality: Left;   CYSTOSCOPY WITH URETEROSCOPY AND STENT PLACEMENT Left 06/18/2013   Procedure: CYSTOSCOPY WITH Lef URETEROSCOPY AND Left STENT PLACEMENT;  Surgeon: Crecencio Mc, MD;  Location: [facility] ORS;  Service: Urology;  Laterality: Left;   ESOPHAGOGASTRODUODENOSCOPY (EGD) WITH PROPOFOL N/A 11/13/2016   Procedure: ESOPHAGOGASTRODUODENOSCOPY (EGD) WITH PROPOFOL;  Surgeon: Kathi Der, MD;  Location: [facility] ENDOSCOPY;  Service: Gastroenterology;  Laterality: N/A;   EUS N/A 02/07/2016   Procedure: ESOPHAGEAL ENDOSCOPIC ULTRASOUND (EUS) RADIAL;  Surgeon: Willis Modena, MD;  Location: [facility] ENDOSCOPY;  Service: Endoscopy;  Laterality: N/A;    EYE SURGERY Bilateral 2014   ioc for catracts    FOOT SURGERY Left 1990   something with toes unsure what    HERNIA REPAIR  2018   IR CATHETER TUBE CHANGE  03/14/2022   IR EMBO TUMOR ORGAN ISCHEMIA INFARCT INC GUIDE ROADMAPPING  08/22/2021   IR NEPHROSTOMY EXCHANGE LEFT  06/27/2022   IR NEPHROSTOMY EXCHANGE LEFT  08/26/2022   IR NEPHROSTOMY EXCHANGE LEFT  10/22/2022   IR NEPHROSTOMY PLACEMENT LEFT  03/14/2022   IR RADIOLOGIST EVAL & MGMT  05/18/2021   IR RADIOLOGIST EVAL & MGMT  10/01/2021   IR RADIOLOGIST EVAL & MGMT  11/12/2021   IR RADIOLOGIST EVAL & MGMT  11/22/2021   IR RADIOLOGIST EVAL & MGMT  11/27/2021   IR RADIOLOGIST EVAL & MGMT  12/20/2021   IR RENAL SUPRASEL UNI S&I MOD SED  08/22/2021   IR US GUIDE VASC ACCESS RIGHT  08/22/2021   LEFT  HEART CATH AND CORONARY ANGIOGRAPHY N/A 08/27/2021   Procedure: LEFT HEART CATH AND CORONARY ANGIOGRAPHY;  Surgeon: Kathleene Hazel, MD;  Location: [facility] INVASIVE CV LAB;  Service: Cardiovascular;  Laterality: N/A;   PARATHYROID EXPLORATION     PERIPHERAL VASCULAR BALLOON ANGIOPLASTY Left 08/30/2021   Procedure: PERIPHERAL VASCULAR BALLOON ANGIOPLASTY;  Surgeon: Cephus Shelling, MD;  Location: [facility] INVASIVE CV LAB;  Service: Cardiovascular;  Laterality: Left;  arm fistula   RADIOLOGY WITH ANESTHESIA Left 08/22/2021   Procedure: MICROWAVE ABLATION;  Surgeon: Simonne Come, MD;  Location: [facility] ORS;  Service: Anesthesiology;  Laterality: Left;   WHIPPLE PROCEDURE N/A 04/23/2016   Procedure: WHIPPLE PROCEDURE;  Surgeon: Almond Lint, MD;  Location: [facility] OR;  Service: General;  Laterality: N/A;   Past Medical History:  Diagnosis Date   Anemia of chronic disease    takes iron   Anxiety    Blood transfusion without reported diagnosis    Bradycardia    Diabetes mellitus without complication (HCC)    became diabetic after Whipple procedure   Diarrhea    Dysrhythmia 04/16/2016   bradycardia due to medication    ESRD on hemodialysis [facility])    M-W-F   GERD  (gastroesophageal reflux disease)    Gout    Headache    History of kidney stones 06/2013   Hyperparathyroidism (HCC)    Hypertension    PONV (postoperative nausea and vomiting)    Sleep apnea    no cpap machine. could not tolerate   Stroke (HCC) 04/16/2016   TIA 1995   Vitamin D deficiency    Wt 120 lb 9.6 oz (54.7 kg)   BMI 21.36 kg/m   Opioid Risk Score:   Fall Risk Score:  `1  Depression screen [facility] 2/9     09/03/2016    2:01 PM  Depression screen PHQ 2/9  Decreased Interest 0  Down, Depressed, Hopeless 0  PHQ - 2 Score 0     Review of Systems  Musculoskeletal:        B/L hip pain  All other systems reviewed and are negative.      Objective:   Physical Exam Vitals and nursing note reviewed.  Constitutional:      Appearance: Normal appearance.  Cardiovascular:     Rate and Rhythm: Bradycardia present.     Pulses: Normal pulses.     Heart sounds: Normal heart sounds.  Pulmonary:     Effort: Pulmonary effort is normal.     Breath sounds: Normal breath sounds.  Musculoskeletal:     Cervical back: Normal range of motion and neck supple.     Comments: Normal Muscle Bulk and Muscle Testing Reveals:  Upper Extremities: Full ROM and Muscle Strength 5/5 Lower Extremities: Right: Full ROM and Muscle Strength 5/5 Left Lower Extremity: Decreased ROM and Muscle Strength 5/5 Left Lower Extremity Flexion Produces Pain into her Left Lower Extremity  Arrived     Skin:    General: Skin is warm and dry.  Neurological:     Mental Status: She is alert and oriented to person, place, and time.  Psychiatric:        Mood and Affect: Mood normal.        Behavior: Behavior normal.           Assessment & Plan:  TBI: Continue Outpatient Therapy with [facility]. Continue to Monitor Atrial Fibrillation with RVR: Continue Amiodarone and Metoprolol: Son stated she has Amiodarone at home, she was discharged on 09/28/2024Metoprol prescription sent  to pharmacy. She has a  Cardiology appointment on 12/16/2022. Ms. Mcgarey and son verbalizes understanding.   Essential Hypertension: Continue current medication regimen. PCP following. She has a scheduled appointment with her PCP today.   ESRD: On Hemodialysis : Nephrology Following. Schedule Tue/ Thurs/ Saturday. AVF + bruit and thrill  F/U with Dr Riley Kill in 4- 6 weeks

## 2022-11-29 NOTE — Progress Notes (Deleted)
Pain Inventory Average Pain {NUMBERS; 0-10:5044} Pain Right Now {NUMBERS; 0-10:5044} My pain is {PAIN DESCRIPTION:21022940}  LOCATION OF PAIN  ***  BOWEL Number of stools per week: *** Oral laxative use {YES/NO:21197} Type of laxative *** Enema or suppository use {YES/NO:21197} History of colostomy {YES/NO:21197} Incontinent {YES/NO:21197}  BLADDER {bladder options:24190} In and out cath, frequency *** Able to self cath {YES/NO:21197} Bladder incontinence {YES/NO:21197} Frequent urination {YES/NO:21197} Leakage with coughing {YES/NO:21197} Difficulty starting stream {YES/NO:21197} Incomplete bladder emptying {YES/NO:21197}   Mobility {MOBILITY DVV:61607371}  Function {FUNCTION:21022946}  Neuro/Psych {NEURO/PSYCH:21022948}  Prior Studies {CPRM PRIOR STUDIES:21022953}  Physicians involved in your care {CPRM PHYSICIANS INVOLVED IN YOUR CARE:21022954}   Family History  Problem Relation Age of Onset   Heart disease Mother    Stroke Mother    Cancer Father        colon   Alzheimer's disease Sister    Alzheimer's disease Sister    Cancer Brother        brain   Breast cancer Neg Hx    Social History   Socioeconomic History   Marital status: Widowed    Spouse name: Not on file   Number of children: Not on file   Years of education: Not on file   Highest education level: Not on file  Occupational History   Not on file  Tobacco Use   Smoking status: Never   Smokeless tobacco: Never  Vaping Use   Vaping status: Never Used  Substance and Sexual Activity   Alcohol use: No   Drug use: No   Sexual activity: Not on file  Other Topics Concern   Not on file  Social History Narrative   Not on file   Social Determinants of Health   Financial Resource Strain: Not on file  Food Insecurity: No Food Insecurity (11/01/2021)   Hunger Vital Sign    Worried About Running Out of Food in the Last Year: Never true    Ran Out of Food in the Last Year: Never true   Transportation Needs: No Transportation Needs (11/01/2021)   PRAPARE - Administrator, Civil Service (Medical): No    Lack of Transportation (Non-Medical): No  Physical Activity: Not on file  Stress: Not on file  Social Connections: Not on file   Past Surgical History:  Procedure Laterality Date   A/V FISTULAGRAM Left 08/30/2021   Procedure: A/V Fistulagram;  Surgeon: Cephus Shelling, MD;  Location: [facility] INVASIVE CV LAB;  Service: Cardiovascular;  Laterality: Left;   ABDOMINAL HYSTERECTOMY  1985   complete   BACK SURGERY  1980   lower   BASCILIC VEIN TRANSPOSITION Left 04/24/2017   Procedure: LEFT ARM FIRST STAGE BASILIC VEIN TRANSPOSITION;  Surgeon: Nada Libman, MD;  Location: [facility] OR;  Service: Vascular;  Laterality: Left;   BASCILIC VEIN TRANSPOSITION Left 07/10/2017   Procedure: SECOND STAGE BASILIC VEIN TRANSPOSITION LEFT ARM;  Surgeon: Nada Libman, MD;  Location: [facility] OR;  Service: Vascular;  Laterality: Left;   BREAST LUMPECTOMY Left x 2   many years apart, benign   CHOLECYSTECTOMY  1985   CYSTOSCOPY W/ URETERAL STENT PLACEMENT Left 10/31/2021   Procedure: CYSTOSCOPY WITH RETROGRADE PYELOGRAM/URETERAL STENT PLACEMENT;  Surgeon: Crist Fat, MD;  Location: [facility] OR;  Service: Urology;  Laterality: Left;   CYSTOSCOPY WITH URETEROSCOPY AND STENT PLACEMENT Left 06/18/2013   Procedure: CYSTOSCOPY WITH Lef URETEROSCOPY AND Left STENT PLACEMENT;  Surgeon: Crecencio Mc, MD;  Location: [facility];  Service: Urology;  Laterality: Left;  ESOPHAGOGASTRODUODENOSCOPY (EGD) WITH PROPOFOL N/A 11/13/2016   Procedure: ESOPHAGOGASTRODUODENOSCOPY (EGD) WITH PROPOFOL;  Surgeon: Kathi Der, MD;  Location: [facility] ENDOSCOPY;  Service: Gastroenterology;  Laterality: N/A;   EUS N/A 02/07/2016   Procedure: ESOPHAGEAL ENDOSCOPIC ULTRASOUND (EUS) RADIAL;  Surgeon: Willis Modena, MD;  Location: [facility] ENDOSCOPY;  Service: Endoscopy;  Laterality: N/A;   EYE SURGERY Bilateral 2014   ioc for  catracts    FOOT SURGERY Left 1990   something with toes unsure what    HERNIA REPAIR  2018   IR CATHETER TUBE CHANGE  03/14/2022   IR EMBO TUMOR ORGAN ISCHEMIA INFARCT INC GUIDE ROADMAPPING  08/22/2021   IR NEPHROSTOMY EXCHANGE LEFT  06/27/2022   IR NEPHROSTOMY EXCHANGE LEFT  08/26/2022   IR NEPHROSTOMY EXCHANGE LEFT  10/22/2022   IR NEPHROSTOMY PLACEMENT LEFT  03/14/2022   IR RADIOLOGIST EVAL & MGMT  05/18/2021   IR RADIOLOGIST EVAL & MGMT  10/01/2021   IR RADIOLOGIST EVAL & MGMT  11/12/2021   IR RADIOLOGIST EVAL & MGMT  11/22/2021   IR RADIOLOGIST EVAL & MGMT  11/27/2021   IR RADIOLOGIST EVAL & MGMT  12/20/2021   IR RENAL SUPRASEL UNI S&I MOD SED  08/22/2021   IR US GUIDE VASC ACCESS RIGHT  08/22/2021   LEFT HEART CATH AND CORONARY ANGIOGRAPHY N/A 08/27/2021   Procedure: LEFT HEART CATH AND CORONARY ANGIOGRAPHY;  Surgeon: Kathleene Hazel, MD;  Location: [facility] INVASIVE CV LAB;  Service: Cardiovascular;  Laterality: N/A;   PARATHYROID EXPLORATION     PERIPHERAL VASCULAR BALLOON ANGIOPLASTY Left 08/30/2021   Procedure: PERIPHERAL VASCULAR BALLOON ANGIOPLASTY;  Surgeon: Cephus Shelling, MD;  Location: [facility] INVASIVE CV LAB;  Service: Cardiovascular;  Laterality: Left;  arm fistula   RADIOLOGY WITH ANESTHESIA Left 08/22/2021   Procedure: MICROWAVE ABLATION;  Surgeon: Simonne Come, MD;  Location: [facility] ORS;  Service: Anesthesiology;  Laterality: Left;   WHIPPLE PROCEDURE N/A 04/23/2016   Procedure: WHIPPLE PROCEDURE;  Surgeon: Almond Lint, MD;  Location: [facility] OR;  Service: General;  Laterality: N/A;   Past Medical History:  Diagnosis Date   Anemia of chronic disease    takes iron   Anxiety    Blood transfusion without reported diagnosis    Bradycardia    Diabetes mellitus without complication (HCC)    became diabetic after Whipple procedure   Diarrhea    Dysrhythmia 04/16/2016   bradycardia due to medication    ESRD on hemodialysis [facility])    M-W-F   GERD (gastroesophageal reflux disease)    Gout     Headache    History of kidney stones 06/2013   Hyperparathyroidism (HCC)    Hypertension    PONV (postoperative nausea and vomiting)    Sleep apnea    no cpap machine. could not tolerate   Stroke (HCC) 04/16/2016   TIA 1995   Vitamin D deficiency    There were no vitals taken for this visit.  Opioid Risk Score:   Fall Risk Score:  `1  Depression screen [facility] 2/9     09/03/2016    2:01 PM  Depression screen PHQ 2/9  Decreased Interest 0  Down, Depressed, Hopeless 0  PHQ - 2 Score 0

## 2022-11-30 DIAGNOSIS — Z992 Dependence on renal dialysis: Secondary | ICD-10-CM | POA: Diagnosis not present

## 2022-11-30 DIAGNOSIS — N186 End stage renal disease: Secondary | ICD-10-CM | POA: Diagnosis not present

## 2022-12-02 ENCOUNTER — Telehealth (HOSPITAL_COMMUNITY): Payer: Self-pay

## 2022-12-02 NOTE — Telephone Encounter (Signed)
Called to move pt's appt down, no answer, left vm. AB

## 2022-12-03 DIAGNOSIS — Z992 Dependence on renal dialysis: Secondary | ICD-10-CM | POA: Diagnosis not present

## 2022-12-03 DIAGNOSIS — N186 End stage renal disease: Secondary | ICD-10-CM | POA: Diagnosis not present

## 2022-12-04 DIAGNOSIS — S069X0D Unspecified intracranial injury without loss of consciousness, subsequent encounter: Secondary | ICD-10-CM | POA: Diagnosis not present

## 2022-12-04 DIAGNOSIS — I5032 Chronic diastolic (congestive) heart failure: Secondary | ICD-10-CM | POA: Diagnosis not present

## 2022-12-04 DIAGNOSIS — S0003XD Contusion of scalp, subsequent encounter: Secondary | ICD-10-CM | POA: Diagnosis not present

## 2022-12-04 DIAGNOSIS — I132 Hypertensive heart and chronic kidney disease with heart failure and with stage 5 chronic kidney disease, or end stage renal disease: Secondary | ICD-10-CM | POA: Diagnosis not present

## 2022-12-04 DIAGNOSIS — E1122 Type 2 diabetes mellitus with diabetic chronic kidney disease: Secondary | ICD-10-CM | POA: Diagnosis not present

## 2022-12-04 DIAGNOSIS — S0282XD Fracture of other specified skull and facial bones, left side, subsequent encounter for fracture with routine healing: Secondary | ICD-10-CM | POA: Diagnosis not present

## 2022-12-04 DIAGNOSIS — S3210XD Unspecified fracture of sacrum, subsequent encounter for fracture with routine healing: Secondary | ICD-10-CM | POA: Diagnosis not present

## 2022-12-04 DIAGNOSIS — D631 Anemia in chronic kidney disease: Secondary | ICD-10-CM | POA: Diagnosis not present

## 2022-12-04 DIAGNOSIS — N186 End stage renal disease: Secondary | ICD-10-CM | POA: Diagnosis not present

## 2022-12-05 DIAGNOSIS — N186 End stage renal disease: Secondary | ICD-10-CM | POA: Diagnosis not present

## 2022-12-05 DIAGNOSIS — Z992 Dependence on renal dialysis: Secondary | ICD-10-CM | POA: Diagnosis not present

## 2022-12-06 DIAGNOSIS — I871 Compression of vein: Secondary | ICD-10-CM | POA: Diagnosis not present

## 2022-12-06 DIAGNOSIS — T82858A Stenosis of vascular prosthetic devices, implants and grafts, initial encounter: Secondary | ICD-10-CM | POA: Diagnosis not present

## 2022-12-06 DIAGNOSIS — N186 End stage renal disease: Secondary | ICD-10-CM | POA: Diagnosis not present

## 2022-12-06 DIAGNOSIS — Z992 Dependence on renal dialysis: Secondary | ICD-10-CM | POA: Diagnosis not present

## 2022-12-07 DIAGNOSIS — Z992 Dependence on renal dialysis: Secondary | ICD-10-CM | POA: Diagnosis not present

## 2022-12-07 DIAGNOSIS — N186 End stage renal disease: Secondary | ICD-10-CM | POA: Diagnosis not present

## 2022-12-10 DIAGNOSIS — N186 End stage renal disease: Secondary | ICD-10-CM | POA: Diagnosis not present

## 2022-12-10 DIAGNOSIS — N2581 Secondary hyperparathyroidism of renal origin: Secondary | ICD-10-CM | POA: Diagnosis not present

## 2022-12-10 DIAGNOSIS — Z992 Dependence on renal dialysis: Secondary | ICD-10-CM | POA: Diagnosis not present

## 2022-12-12 DIAGNOSIS — N2581 Secondary hyperparathyroidism of renal origin: Secondary | ICD-10-CM | POA: Diagnosis not present

## 2022-12-12 DIAGNOSIS — Z992 Dependence on renal dialysis: Secondary | ICD-10-CM | POA: Diagnosis not present

## 2022-12-12 DIAGNOSIS — N186 End stage renal disease: Secondary | ICD-10-CM | POA: Diagnosis not present

## 2022-12-14 DIAGNOSIS — Z992 Dependence on renal dialysis: Secondary | ICD-10-CM | POA: Diagnosis not present

## 2022-12-14 DIAGNOSIS — N186 End stage renal disease: Secondary | ICD-10-CM | POA: Diagnosis not present

## 2022-12-16 ENCOUNTER — Ambulatory Visit: Payer: Medicare PPO | Admitting: Nurse Practitioner

## 2022-12-16 ENCOUNTER — Encounter: Payer: Self-pay | Admitting: Nurse Practitioner

## 2022-12-16 ENCOUNTER — Other Ambulatory Visit (HOSPITAL_COMMUNITY): Payer: Self-pay | Admitting: Interventional Radiology

## 2022-12-16 ENCOUNTER — Ambulatory Visit (HOSPITAL_COMMUNITY)
Admission: RE | Admit: 2022-12-16 | Discharge: 2022-12-16 | Disposition: A | Discharge status: Home / Self Care | Payer: Medicare PPO | Source: Ambulatory Visit | Attending: Interventional Radiology | Admitting: Interventional Radiology

## 2022-12-16 VITALS — BP 132/60 | HR 60 | Ht 63.0 in | Wt 122.8 lb

## 2022-12-16 DIAGNOSIS — I4819 Other persistent atrial fibrillation: Secondary | ICD-10-CM | POA: Insufficient documentation

## 2022-12-16 DIAGNOSIS — I2510 Atherosclerotic heart disease of native coronary artery without angina pectoris: Secondary | ICD-10-CM

## 2022-12-16 DIAGNOSIS — I480 Paroxysmal atrial fibrillation: Secondary | ICD-10-CM

## 2022-12-16 DIAGNOSIS — C649 Malignant neoplasm of unspecified kidney, except renal pelvis: Secondary | ICD-10-CM | POA: Diagnosis not present

## 2022-12-16 DIAGNOSIS — Z87820 Personal history of traumatic brain injury: Secondary | ICD-10-CM

## 2022-12-16 DIAGNOSIS — N186 End stage renal disease: Secondary | ICD-10-CM | POA: Diagnosis not present

## 2022-12-16 DIAGNOSIS — I951 Orthostatic hypotension: Secondary | ICD-10-CM | POA: Diagnosis not present

## 2022-12-16 DIAGNOSIS — N184 Chronic kidney disease, stage 4 (severe): Secondary | ICD-10-CM

## 2022-12-16 DIAGNOSIS — E1122 Type 2 diabetes mellitus with diabetic chronic kidney disease: Secondary | ICD-10-CM | POA: Diagnosis not present

## 2022-12-16 DIAGNOSIS — Z8673 Personal history of transient ischemic attack (TIA), and cerebral infarction without residual deficits: Secondary | ICD-10-CM

## 2022-12-16 DIAGNOSIS — Z436 Encounter for attention to other artificial openings of urinary tract: Secondary | ICD-10-CM | POA: Insufficient documentation

## 2022-12-16 DIAGNOSIS — I5042 Chronic combined systolic (congestive) and diastolic (congestive) heart failure: Secondary | ICD-10-CM

## 2022-12-16 DIAGNOSIS — Z992 Dependence on renal dialysis: Secondary | ICD-10-CM | POA: Diagnosis not present

## 2022-12-16 DIAGNOSIS — N2581 Secondary hyperparathyroidism of renal origin: Secondary | ICD-10-CM | POA: Diagnosis not present

## 2022-12-16 DIAGNOSIS — I10 Essential (primary) hypertension: Secondary | ICD-10-CM | POA: Diagnosis not present

## 2022-12-16 HISTORY — PX: IR NEPHROSTOMY EXCHANGE LEFT: IMG6069

## 2022-12-16 MED ORDER — IOHEXOL 300 MG/ML  SOLN
50.0000 mL | Freq: Once | INTRAMUSCULAR | Status: AC | PRN
  Administered 2022-12-16: 10 mL

## 2022-12-16 MED ORDER — LIDOCAINE HCL (PF) 1 % IJ SOLN
10.0000 mL | Freq: Once | INTRAMUSCULAR | Status: AC
  Administered 2022-12-16: 10 mL

## 2022-12-16 MED ORDER — LIDOCAINE HCL 1 % IJ SOLN
INTRAMUSCULAR | Status: AC
  Filled 2022-12-16: qty 20

## 2022-12-16 NOTE — Patient Instructions (Signed)
Medication Instructions: Hold Metoprolol if systolic BP (top number) is less than 110.  *If you need a refill on your cardiac medications before your next appointment, please call your pharmacy*   Lab Work: NONE ordered at this time of appointment    Testing/Procedures: Your physician has requested that you have an echocardiogram in 2 months. Echocardiography is a painless test that uses sound waves to create images of your heart. It provides your doctor with information about the size and shape of your heart and how well your heart's chambers and valves are working. This procedure takes approximately one hour. There are no restrictions for this procedure. Please do NOT wear cologne, perfume, aftershave, or lotions (deodorant is allowed). Please arrive 15 minutes prior to your appointment time.   Follow-Up: At [facility], you and your health needs are our priority.  As part of our continuing mission to provide you with exceptional heart care, we have created designated Provider Care Teams.  These Care Teams include your primary Cardiologist (physician) and Advanced Practice Providers (APPs -  Physician Assistants and Nurse Practitioners) who all work together to provide you with the care you need, when you need it.  We recommend signing up for the patient portal called "MyChart".  Sign up information is provided on this After Visit Summary.  MyChart is used to connect with patients for Virtual Visits (Telemedicine).  Patients are able to view lab/test results, encounter notes, upcoming appointments, etc.  Non-urgent messages can be sent to your provider as well.   To learn more about what you can do with MyChart, go to [email or web address].    Your next appointment:    Keep follow up   Provider:   Peter [place], MD

## 2022-12-16 NOTE — Progress Notes (Signed)
Office Visit    Patient Name: Kim Burns Date of Encounter: 12/16/2022  Primary Care Provider:  Lewis Moccasin, MD Primary Cardiologist:  Peter Swaziland, MD  Chief Complaint    87 year old female with a history of persistent atrial fibrillation, nonobstructive CAD, orthostatic hypotension, hypertension, a ESRD on HD MWF, type 2 diabetes, CVA, whipple procedure in 2014 with pancreatectomy and partial bowel resection, renal carcinoma s/p ablation in 08/2021, L nephrostomy tube who presents for hospital follow-up related to heart failure and atrial fibrillation.   Past Medical History    Past Medical History:  Diagnosis Date   Anemia of chronic disease    takes iron   Anxiety    Blood transfusion without reported diagnosis    Bradycardia    Diabetes mellitus without complication (HCC)    became diabetic after Whipple procedure   Diarrhea    Dysrhythmia 04/16/2016   bradycardia due to medication    ESRD on hemodialysis [facility])    M-W-F   GERD (gastroesophageal reflux disease)    Gout    Headache    History of kidney stones 06/2013   Hyperparathyroidism (HCC)    Hypertension    PONV (postoperative nausea and vomiting)    Sleep apnea    no cpap machine. could not tolerate   Stroke (HCC) 04/16/2016   TIA 1995   Vitamin D deficiency    Past Surgical History:  Procedure Laterality Date   A/V FISTULAGRAM Left 08/30/2021   Procedure: A/V Fistulagram;  Surgeon: Cephus Shelling, MD;  Location: [facility] INVASIVE CV LAB;  Service: Cardiovascular;  Laterality: Left;   ABDOMINAL HYSTERECTOMY  1985   complete   BACK SURGERY  1980   lower   BASCILIC VEIN TRANSPOSITION Left 04/24/2017   Procedure: LEFT ARM FIRST STAGE BASILIC VEIN TRANSPOSITION;  Surgeon: Nada Libman, MD;  Location: [facility] OR;  Service: Vascular;  Laterality: Left;   BASCILIC VEIN TRANSPOSITION Left 07/10/2017   Procedure: SECOND STAGE BASILIC VEIN TRANSPOSITION LEFT ARM;  Surgeon: Nada Libman, MD;   Location: [facility] OR;  Service: Vascular;  Laterality: Left;   BREAST LUMPECTOMY Left x 2   many years apart, benign   CHOLECYSTECTOMY  1985   CYSTOSCOPY W/ URETERAL STENT PLACEMENT Left 10/31/2021   Procedure: CYSTOSCOPY WITH RETROGRADE PYELOGRAM/URETERAL STENT PLACEMENT;  Surgeon: Crist Fat, MD;  Location: [facility] OR;  Service: Urology;  Laterality: Left;   CYSTOSCOPY WITH URETEROSCOPY AND STENT PLACEMENT Left 06/18/2013   Procedure: CYSTOSCOPY WITH Lef URETEROSCOPY AND Left STENT PLACEMENT;  Surgeon: Crecencio Mc, MD;  Location: [facility] ORS;  Service: Urology;  Laterality: Left;   ESOPHAGOGASTRODUODENOSCOPY (EGD) WITH PROPOFOL N/A 11/13/2016   Procedure: ESOPHAGOGASTRODUODENOSCOPY (EGD) WITH PROPOFOL;  Surgeon: Kathi Der, MD;  Location: [facility] ENDOSCOPY;  Service: Gastroenterology;  Laterality: N/A;   EUS N/A 02/07/2016   Procedure: ESOPHAGEAL ENDOSCOPIC ULTRASOUND (EUS) RADIAL;  Surgeon: Willis Modena, MD;  Location: [facility] ENDOSCOPY;  Service: Endoscopy;  Laterality: N/A;   EYE SURGERY Bilateral 2014   ioc for catracts    FOOT SURGERY Left 1990   something with toes unsure what    HERNIA REPAIR  2018   IR CATHETER TUBE CHANGE  03/14/2022   IR EMBO TUMOR ORGAN ISCHEMIA INFARCT INC GUIDE ROADMAPPING  08/22/2021   IR NEPHROSTOMY EXCHANGE LEFT  06/27/2022   IR NEPHROSTOMY EXCHANGE LEFT  08/26/2022   IR NEPHROSTOMY EXCHANGE LEFT  10/22/2022   IR NEPHROSTOMY PLACEMENT LEFT  03/14/2022   IR RADIOLOGIST EVAL & MGMT  05/18/2021   IR RADIOLOGIST EVAL & MGMT  10/01/2021   IR RADIOLOGIST EVAL & MGMT  11/12/2021   IR RADIOLOGIST EVAL & MGMT  11/22/2021   IR RADIOLOGIST EVAL & MGMT  11/27/2021   IR RADIOLOGIST EVAL & MGMT  12/20/2021   IR RENAL SUPRASEL UNI S&I MOD SED  08/22/2021   IR US GUIDE VASC ACCESS RIGHT  08/22/2021   LEFT HEART CATH AND CORONARY ANGIOGRAPHY N/A 08/27/2021   Procedure: LEFT HEART CATH AND CORONARY ANGIOGRAPHY;  Surgeon: Kathleene Hazel, MD;  Location: [facility] INVASIVE CV LAB;  Service:  Cardiovascular;  Laterality: N/A;   PARATHYROID EXPLORATION     PERIPHERAL VASCULAR BALLOON ANGIOPLASTY Left 08/30/2021   Procedure: PERIPHERAL VASCULAR BALLOON ANGIOPLASTY;  Surgeon: Cephus Shelling, MD;  Location: [facility] INVASIVE CV LAB;  Service: Cardiovascular;  Laterality: Left;  arm fistula   RADIOLOGY WITH ANESTHESIA Left 08/22/2021   Procedure: MICROWAVE ABLATION;  Surgeon: Simonne Come, MD;  Location: [facility] ORS;  Service: Anesthesiology;  Laterality: Left;   WHIPPLE PROCEDURE N/A 04/23/2016   Procedure: WHIPPLE PROCEDURE;  Surgeon: Almond Lint, MD;  Location: [facility] OR;  Service: General;  Laterality: N/A;    Allergies  Allergies  Allergen Reactions   Lopid [Gemfibrozil] Other (See Comments)    Myalgia    Lotemax [Loteprednol Etabonate] Rash    Skin burning  Blurry vision   Statins Other (See Comments)    Rosuvastatin Muscle weakness   Norco [Hydrocodone-Acetaminophen] Other (See Comments)    Headaches  Tolerates acetaminophen    Other Diarrhea    Real Butter - diarrhea   Vibra-Tab [Doxycycline] Other (See Comments)    Unknown reaction   Amlodipine Other (See Comments)    Norvasc   Codeine Other (See Comments)    headache   Hydrocodone Other (See Comments)   Hydrocodone-Acetaminophen Other (See Comments)    Headache. Tolerates acetaminophen.   Lisinopril Other (See Comments)   Verapamil Other (See Comments)     Labs/Other Studies Reviewed    The following studies were reviewed today:  Cardiac Studies & Procedures   CARDIAC CATHETERIZATION  CARDIAC CATHETERIZATION 08/27/2021  Narrative   Prox RCA lesion is 20% stenosed.   Mid LM to Dist LM lesion is 20% stenosed.   Ost LAD lesion is 30% stenosed.   Mid Cx lesion is 20% stenosed.   Prox LAD lesion is 50% stenosed.   The left ventricular systolic function is normal.   LV end diastolic pressure is normal.   The left ventricular ejection fraction is 55-65% by visual estimate.   There is no mitral valve  regurgitation.  Mild non-obstructive CAD Normal LV systolic function Elevated troponin and chest pain is likely due to demand ischemic in the setting of rapid atrial fibrillation.  Recommendation: Medical management of CAD.  Findings Coronary Findings Diagnostic  Dominance: Right  Left Main Mid LM to Dist LM lesion is 20% stenosed.  Left Anterior Descending Vessel is large. Ost LAD lesion is 30% stenosed. Prox LAD lesion is 50% stenosed. The lesion is calcified.  Left Circumflex Mid Cx lesion is 20% stenosed.  Right Coronary Artery Vessel is large. Prox RCA lesion is 20% stenosed.  Intervention  No interventions have been documented.     ECHOCARDIOGRAM  ECHOCARDIOGRAM COMPLETE 10/25/2022  Narrative ECHOCARDIOGRAM REPORT    Patient Name:   Rai P Armand Date of Exam: 10/25/2022 Medical Rec #:  [id number]        Height:       63.0 in  Accession #:    [id number]       Weight:       123.0 lb Date of Birth:  [birth year]        BSA:          1.573 m Patient Age:    87 years         BP:           148/81 mmHg Patient Gender: F                HR:           115 bpm. Exam Location:  Inpatient  Procedure: 2D Echo, Color Doppler and Cardiac Doppler  Indications:    Syncope  History:        Patient has prior history of Echocardiogram examinations, most recent 09/15/2022. CKD/ESRD and Stroke, Arrythmias:Atrial Fibrillation; Risk Factors:Hypertension and Diabetes.  Sonographer:    Milbert Coulter Referring Phys: [id number] BELKYS A REGALADO  IMPRESSIONS   1. Left ventricular ejection fraction, by estimation, is 45 to 50%. Left ventricular ejection fraction by PLAX is 48 %. The left ventricle has mildly decreased function. The left ventricle demonstrates global hypokinesis. There is mild left ventricular hypertrophy. Left ventricular diastolic function could not be evaluated. 2. Right ventricular systolic function is mildly reduced. The right ventricular size is normal. 3. Left  atrial size was severely dilated. 4. Right atrial size was mildly dilated. 5. The mitral valve is abnormal. Mild to moderate mitral valve regurgitation. 6. The aortic valve is tricuspid. Aortic valve regurgitation is not visualized. Aortic valve sclerosis is present, with no evidence of aortic valve stenosis. 7. The inferior vena cava is normal in size with greater than 50% respiratory variability, suggesting right atrial pressure of 3 mmHg. 8. Rhythm strip during this exam demonstrates atrial fibrillation and with rapid ventricular response.  Comparison(s): Changes from prior study are noted. 09/15/2022: LVEF 55-60%.  FINDINGS Left Ventricle: Left ventricular ejection fraction, by estimation, is 45 to 50%. Left ventricular ejection fraction by PLAX is 48 %. The left ventricle has mildly decreased function. The left ventricle demonstrates global hypokinesis. The left ventricular internal cavity size was normal in size. There is mild left ventricular hypertrophy. Left ventricular diastolic function could not be evaluated due to atrial fibrillation. Left ventricular diastolic function could not be evaluated.  Right Ventricle: The right ventricular size is normal. No increase in right ventricular wall thickness. Right ventricular systolic function is mildly reduced.  Left Atrium: Left atrial size was severely dilated.  Right Atrium: Right atrial size was mildly dilated.  Pericardium: There is no evidence of pericardial effusion.  Mitral Valve: The mitral valve is abnormal. There is mild calcification of the posterior and anterior mitral valve leaflet(s). Mild to moderate mitral annular calcification. Mild to moderate mitral valve regurgitation.  Tricuspid Valve: The tricuspid valve is normal in structure. Tricuspid valve regurgitation is not demonstrated.  Aortic Valve: The aortic valve is tricuspid. Aortic valve regurgitation is not visualized. Aortic valve sclerosis is present, with no  evidence of aortic valve stenosis. Aortic valve mean gradient measures 3.0 mmHg. Aortic valve peak gradient measures 5.3 mmHg. Aortic valve area, by VTI measures 2.26 cm.  Pulmonic Valve: The pulmonic valve was normal in structure. Pulmonic valve regurgitation is not visualized.  Aorta: The aortic root and ascending aorta are structurally normal, with no evidence of dilitation.  Venous: The inferior vena cava is normal in size with greater than 50% respiratory variability, suggesting right atrial pressure of 3  mmHg.  IAS/Shunts: No atrial level shunt detected by color flow Doppler.  EKG: Rhythm strip during this exam demonstrates atrial fibrillation and with rapid ventricular response.   LEFT VENTRICLE PLAX 2D LV EF:         Left            Diastology ventricular     LV e' medial:    12.00 cm/s ejection        LV E/e' medial:  10.4 fraction by     LV e' lateral:   18.50 cm/s PLAX is 48      LV E/e' lateral: 6.8 %. LVIDd:         3.80 cm LVIDs:         2.90 cm LV PW:         1.10 cm LV IVS:        1.20 cm LVOT diam:     1.90 cm LV SV:         38 LV SV Index:   24 LVOT Area:     2.84 cm   RIGHT VENTRICLE RV Basal diam:  3.50 cm RV Mid diam:    2.70 cm RV S prime:     12.20 cm/s TAPSE (M-mode): 1.0 cm  LEFT ATRIUM             Index        RIGHT ATRIUM           Index LA diam:        4.30 cm 2.73 cm/m   RA Area:     16.50 cm LA Vol (A2C):   64.3 ml 40.88 ml/m  RA Volume:   40.70 ml  25.88 ml/m LA Vol (A4C):   87.9 ml 55.89 ml/m LA Biplane Vol: 75.6 ml 48.07 ml/m AORTIC VALVE AV Area (Vmax):    2.18 cm AV Area (Vmean):   1.93 cm AV Area (VTI):     2.26 cm AV Vmax:           115.00 cm/s AV Vmean:          85.000 cm/s AV VTI:            0.168 m AV Peak Grad:      5.3 mmHg AV Mean Grad:      3.0 mmHg LVOT Vmax:         88.40 cm/s LVOT Vmean:        58.000 cm/s LVOT VTI:          0.134 m LVOT/AV VTI ratio: 0.80  AORTA Ao Root diam: 2.60 cm  MITRAL  VALVE MV Area (PHT): 4.29 cm     SHUNTS MV Decel Time: 177 msec     Systemic VTI:  0.13 m MV E velocity: 125.00 cm/s  Systemic Diam: 1.90 cm MV A velocity: 42.30 cm/s MV E/A ratio:  2.96  Zoila Shutter MD Electronically signed by Zoila Shutter MD Signature Date/Time: 10/25/2022/5:32:24 PM    Final            Recent Labs: 09/14/2022: B Natriuretic Peptide 1,469.0 10/30/2022: Magnesium 1.7 10/31/2022: ALT 32 11/13/2022: Hemoglobin 10.4; Platelets 188; TSH 3.867 11/14/2022: BUN 23; Creatinine, Ser 2.66; Potassium 4.6; Sodium 131  Recent Lipid Panel    Component Value Date/Time   CHOL 104 10/27/2022 0550   CHOL 133 08/09/2021 1143   TRIG 84 10/27/2022 0550   HDL 49 10/27/2022 0550   HDL 43 08/09/2021 1143   CHOLHDL 2.1 10/27/2022 0550  VLDL 17 10/27/2022 0550   LDLCALC 38 10/27/2022 0550   LDLCALC 68 08/09/2021 1143    History of Present Illness    87 year old female with the above past medical history including persistent atrial fibrillation, nonobstructive CAD, orthostatic hypotension, hypertension, a ESRD on HD MWF, type 2 diabetes, CVA, whipple procedure in 2014 with pancreatectomy and partial bowel resection, and renal carcinoma s/p ablation in 08/2021, L nephrostomy tube.    She was diagnosed with atrial fibrillation following ablation procedure for left renal carcinoma in July 2023.  She was placed on rate control medications and Eliquis, she spontaneously converted to sinus rhythm.  She had elevated troponin at the time.  Cardiac catheterization revealed nonobstructive CAD with 20% proximal RCA, 20% left circumflex, 20% left main, 30% ostial LAD, 50% proximal LAD stenoses, EF 55 to 65%.  Echocardiogram the time showed EF 60 to 65%, moderately reduced RV systolic function, G3 DD, RVSP 46.1 mmHg, severe BAE, mild MR, mild to moderate TR.  She had sepsis due to hydronephrosis and obstruction in 10/2021.  She had significant anemia and subscapular renal hematoma in December 2023,  Eliquis was discontinued. She has had difficulty managing fluid volume with hypotension during dialysis.  She presented to the ED on 09/13/2022 with generalized weakness and fatigue.  She was found to be in atrial fibrillation with RVR, HR 150-170s.  She was started on diltiazem drip.  Cardiology was consulted.  Again the decision was made to defer anticoagulation as the risk of bleeding outweighed the risk of stroke.  Repeat echocardiogram showed EF 55-60%, no RWMA, normal RV size, moderately reduced RV function, severely elevated PA systolic pressure, severe LA enlargement, mild MR and mild TR.  She was also noted to have acute metabolic encephalopathy/myoclonus thought to be secondary to inappropriate Valtrex dosing in the setting of zoster ophthalmicus.  Her mental status improved with renal dosing. She was last seen in the office on 10/01/2022 and noted intermittent elevated heart rate, persistent weakness, dizziness, generalized fatigue, bilateral lower extremity edema.  Amiodarone was increased to 200 mg daily.  Once again rate control strategy was recommended given no anticoagulation.  She was hospitalized in September 2024 in the setting of fall that resulted in contrecoup brain injury with nondisplaced left posterior skull fracture, right anterior frontal lobe hemorrhagic contusion, nondisplaced sacral fracture at S4.  MRI brain showed stable contusion, small acute to early subacute ischemic, hemorrhagic infarct.  Neurology was consulted.  It was felt that her stroke was likely cardioembolic in the setting of atrial fibrillation with RVR not on anticoagulation.  Cardiology was consulted.  She was reloaded with amiodarone and started on low-dose metoprolol.  Repeat echocardiogram showed EF 45% to 50%, mild RV dysfunction, severe left atrial enlargement, mild right atrial enlargement, mild to moderate mitral valve regurgitation.  She was discharged to inpatient rehab.  She presents today for follow-up  accompanied by her son.  Since her hospitalization she has been stable from a cardiac standpoint.  She notes dizziness with standing and position changes, some mild dyspnea on exertion, bilateral lower extremity edema. She denies chest pain, palpitations, presyncope or syncope.    Home Medications    Current Outpatient Medications  Medication Sig Dispense Refill   acetaminophen (TYLENOL) 500 MG tablet Take 1 tablet (500 mg total) by mouth 3 (three) times daily. 100 tablet 0   amantadine (SYMMETREL) 100 MG capsule Take 1 capsule (100 mg total) by mouth once a week. 30 capsule 0   amiodarone (PACERONE) 200  MG tablet Take 1 tablet (200 mg total) by mouth daily. 30 tablet 0   aspirin 81 MG chewable tablet Chew 1 tablet (81 mg total) by mouth daily. 90 tablet 0   B Complex-C-Zn-Folic Acid (DIALYVITE 800 WITH ZINC) 0.8 MG TABS Take 1 tablet by mouth daily.     cycloSPORINE (RESTASIS) 0.05 % ophthalmic emulsion Place 1 drop into both eyes daily as needed (irritation).     lidocaine-prilocaine (EMLA) cream Apply 1 Application topically every Monday, Wednesday, and Friday with hemodialysis.     lipase/protease/amylase (CREON) 36000 UNITS CPEP capsule Take 36,000-72,000 Units by mouth See admin instructions. Take 72,000 units (2 capsules) by mouth with meals and 36,000 units (1 capsule) with snacks.     metoprolol succinate (TOPROL-XL) 25 MG 24 hr tablet Take 1 tablet (25 mg total) by mouth daily. 30 tablet 0   rOPINIRole (REQUIP) 0.25 MG tablet Take 1 tablet (0.25 mg total) by mouth 2 (two) times daily. (Patient taking differently: Take 0.25-0.5 mg by mouth See admin instructions. Take 0.5mg  (1 tablet) by mouth every night at bedtime and 0.25mg  (1/2 tablet) in the mornings as needed.) 60 tablet 1   sevelamer carbonate (RENVELA) 800 MG tablet Take 1 tablet (800 mg total) by mouth 3 (three) times daily with meals.     benzonatate (TESSALON) 100 MG capsule Take 1 capsule (100 mg total) by mouth 3 (three) times  daily as needed for cough. (Patient not taking: Reported on 12/16/2022) 45 capsule 0   guaiFENesin (MUCINEX) 600 MG 12 hr tablet Take 2 tablets (1,200 mg total) by mouth 2 (two) times daily. (Patient not taking: Reported on 12/16/2022) 30 tablet 0   lidocaine (LIDODERM) 5 % Place 1 patch onto the skin daily. Remove & Discard patch within 12 hours or as directed by MD (Patient not taking: Reported on 12/16/2022) 30 patch 0   meclizine (ANTIVERT) 12.5 MG tablet Take 1 tablet (12.5 mg total) by mouth 3 (three) times daily as needed for dizziness. (Patient not taking: Reported on 12/16/2022)     olopatadine (PATADAY) 0.1 % ophthalmic solution Place 1 drop into both eyes 2 (two) times daily as needed for allergies. (Patient not taking: Reported on 12/16/2022)     valACYclovir (VALTREX) 500 MG tablet Take 1 tablet (500 mg total) by mouth at bedtime. (Patient not taking: Reported on 12/16/2022) 14 tablet 0   No current facility-administered medications for this visit.     Review of Systems   She denies chest pain, palpitations, pnd, orthopnea, n, v, syncope, weight gain, or early satiety. All other systems reviewed and are otherwise negative except as noted above.   Physical Exam    VS:  BP 132/60 (BP Location: Right Arm, Patient Position: Sitting, Cuff Size: Normal)   Ht 5\' 3"  (1.6 m)   Wt 122 lb 12.8 oz (55.7 kg)   SpO2 96%   BMI 21.75 kg/m   GEN: Well nourished, well developed, in no acute distress.  HEENT: normal. Neck: Supple, no JVD, carotid bruits, or masses. Cardiac: RRR, no murmurs, rubs, or gallops. No clubbing, cyanosis,non-pitting bilateral lower extremity edema.  Radials/DP/PT 2+ and equal bilaterally. LUE edema.   Respiratory:  Respirations regular and unlabored, clear to auscultation bilaterally. GI: Soft, nontender, nondistended, BS + x 4. MS: no deformity or atrophy. Skin: warm and dry, no rash. Neuro:  Strength and sensation are intact. Psych: Normal affect.  Accessory  Clinical Findings    ECG personally reviewed by me today - EKG Interpretation  Date/Time:  Monday December 16 2022 15:46:47 EDT Ventricular Rate:  60 PR Interval:  144 QRS Duration:  96 QT Interval:  486 QTC Calculation: 486 R Axis:   -5  Text Interpretation: Normal sinus rhythm Nonspecific T wave abnormality Confirmed by Bernadene Person ([id number]) on 12/16/2022 3:51:32 PM  - no acute changes.   Lab Results  Component Value Date   WBC 8.1 11/13/2022   HGB 10.4 (L) 11/13/2022   HCT 31.4 (L) 11/13/2022   MCV 95.4 11/13/2022   PLT 188 11/13/2022   Lab Results  Component Value Date   CREATININE 2.66 (H) 11/14/2022   BUN 23 11/14/2022   NA 131 (L) 11/14/2022   K 4.6 11/14/2022   CL 97 (L) 11/14/2022   CO2 21 (L) 11/14/2022   Lab Results  Component Value Date   ALT 32 10/31/2022   AST 21 10/31/2022   ALKPHOS 71 10/31/2022   BILITOT 0.6 10/31/2022   Lab Results  Component Value Date   CHOL 104 10/27/2022   HDL 49 10/27/2022   LDLCALC 38 10/27/2022   TRIG 84 10/27/2022   CHOLHDL 2.1 10/27/2022    Lab Results  Component Value Date   HGBA1C 6.1 (H) 10/27/2022    Assessment & Plan    1. Persistent atrial fibrillation: Diagnosed in July 2023 following ablation for left renal carcinoma. She had a subscapular renal hematoma while on anticoagulation, Eliquis was discontinued.  Recent hospitalization with A-fib with RVR, she was reloaded with amiodarone and started on low-dose metoprolol (BP has not tolerated high dose beta-blocker or diltiazem).  She converted to NSR.  Also with suspected cardioembolic CVA as below. Now maintaining sinus rhythm, though rate control strategy would be preferred over rhythm control strategy as she is not a candidate for anticoauglation. Additionally, she is not interested in pursuing watchman at this time. Reviewed ED precautions.  Continue amiodarone, metoprolol.  2. Nonobstructive CAD: Cardiac catheterization in 08/2021 revealed nonobstructive CAD with  20% proximal RCA, 20% left circumflex, 20% left main, 30% ostial LAD, 50% proximal LAD stenoses, EF 55 to 65%.  She has stable dyspnea with exertion, denies chest pain.  No indication for ischemic evaluation at this time.   3. Chronic combined systolic and diastolic heart failure: Prior EF 55-60%. Most recent echocardiogram showed EF 45% to 50%, mild RV dysfunction, severe left atrial enlargement, mild right atrial enlargement, mild to moderate mitral valve regurgitation. Lasix was previously discontinued.  She does have nonpitting bilateral lower extremity edema, left greater than right, se notes mild dyspnea on exertion, otherwise euvolemic and well compensated.  Suspect component of demand ischemia in the setting of atrial fibrillation with RVR. Will repeat echo in 1-2 months to re-evaluate LVEF.  Fluid volume management per HD.    4. Orthostatic hypotension/hypertension: BP stable, though she notes frequent dizziness with position changes. She denies any presyncope, syncope.  She has historically taken meclizine for dizziness though she he has not taken any meclizine since hospital discharge as she ran out of her prescription. Previously on midodrine-it appears this was discontinued during her recent hospitalization. Recommend follow-up with nephrology regarding midodrine. Advised her to hold metoprolol for SBP less than 110.  Encouraged gradual position changes, adequate hydration.    5. History of CVA/traumatic brain injury:  Hospitalized in September 2024 in the setting of fall that resulted in contrecoup brain injury with nondisplaced left posterior skull fracture, right anterior frontal lobe hemorrhagic contusion, nondisplaced sacral fracture at S4. MRI brain showed stable contusion, small  acute to early subacute ischemic, hemorrhagic infarct. Per neurology, stroke likely cardioembolic in the setting of atrial fibrillation with RVR not on anticoagulation (see #1). Following with neurology. On low-dose  ASA.   6. ESRD: On HD, following with nephrology.    7. Type 2 diabetes: A1c was 6.1 in 10/2022.  Managed per PCP.   8. Disposition: Follow-up as scheduled with Dr. Swaziland in 12/2022.       Joylene Grapes, NP 12/16/2022, 3:55 PM

## 2022-12-18 ENCOUNTER — Telehealth: Payer: Self-pay | Admitting: Nurse Practitioner

## 2022-12-18 DIAGNOSIS — Z7409 Other reduced mobility: Secondary | ICD-10-CM | POA: Diagnosis not present

## 2022-12-18 DIAGNOSIS — Z992 Dependence on renal dialysis: Secondary | ICD-10-CM | POA: Diagnosis not present

## 2022-12-18 DIAGNOSIS — S069XAA Unspecified intracranial injury with loss of consciousness status unknown, initial encounter: Secondary | ICD-10-CM | POA: Diagnosis not present

## 2022-12-18 DIAGNOSIS — M539 Dorsopathy, unspecified: Secondary | ICD-10-CM | POA: Diagnosis not present

## 2022-12-18 DIAGNOSIS — N2581 Secondary hyperparathyroidism of renal origin: Secondary | ICD-10-CM | POA: Diagnosis not present

## 2022-12-18 DIAGNOSIS — N186 End stage renal disease: Secondary | ICD-10-CM | POA: Diagnosis not present

## 2022-12-18 DIAGNOSIS — Z789 Other specified health status: Secondary | ICD-10-CM | POA: Diagnosis not present

## 2022-12-18 DIAGNOSIS — Z9181 History of falling: Secondary | ICD-10-CM | POA: Diagnosis not present

## 2022-12-18 DIAGNOSIS — E119 Type 2 diabetes mellitus without complications: Secondary | ICD-10-CM | POA: Diagnosis not present

## 2022-12-18 DIAGNOSIS — M170 Bilateral primary osteoarthritis of knee: Secondary | ICD-10-CM | POA: Diagnosis not present

## 2022-12-18 NOTE — Telephone Encounter (Signed)
Called pt's son to inform them that pt needed to contact PCP for a refill on medication meclizine, because this is not a cardiac medication, pt would need to contact PCP for a refill. I advised pt's son the if they have any cardiac problems, questions or concerns, to give our office a call back. Son verbalized understanding.

## 2022-12-18 NOTE — Telephone Encounter (Signed)
 *  STAT* If patient is at the pharmacy, call can be transferred to refill team.   1. Which medications need to be refilled? (please list name of each medication and dose if known)   meclizine (ANTIVERT) 12.5 MG tablet     2. Would you like to learn more about the convenience, safety, & potential cost savings by using the [facility]?    3. Are you open to using the [facility] (Type [facility].   4. Which pharmacy/location (including street and city if local pharmacy) is medication to be sent to?  [facility] [address] [place] - [address]     5. Do they need a 30 day or 90 day supply? 30 days  Pt's son said, Irving Burton will refill this medication. He said, he is going to pick up pt at dialysis and it will be helpful if they can pick this medication on their way home

## 2022-12-19 DIAGNOSIS — Z992 Dependence on renal dialysis: Secondary | ICD-10-CM | POA: Diagnosis not present

## 2022-12-19 DIAGNOSIS — I158 Other secondary hypertension: Secondary | ICD-10-CM | POA: Diagnosis not present

## 2022-12-19 DIAGNOSIS — N186 End stage renal disease: Secondary | ICD-10-CM | POA: Diagnosis not present

## 2022-12-20 DIAGNOSIS — N186 End stage renal disease: Secondary | ICD-10-CM | POA: Diagnosis not present

## 2022-12-20 DIAGNOSIS — N2581 Secondary hyperparathyroidism of renal origin: Secondary | ICD-10-CM | POA: Diagnosis not present

## 2022-12-20 DIAGNOSIS — Z992 Dependence on renal dialysis: Secondary | ICD-10-CM | POA: Diagnosis not present

## 2022-12-23 ENCOUNTER — Emergency Department (HOSPITAL_COMMUNITY): Payer: Medicare PPO

## 2022-12-23 ENCOUNTER — Encounter (HOSPITAL_COMMUNITY): Payer: Self-pay | Admitting: Emergency Medicine

## 2022-12-23 ENCOUNTER — Inpatient Hospital Stay (HOSPITAL_COMMUNITY)
Admission: EM | Admit: 2022-12-23 | Discharge: 2023-01-02 | DRG: 602 | Disposition: A | Discharge status: Home Health | Payer: Medicare PPO | Attending: Student | Admitting: Student

## 2022-12-23 ENCOUNTER — Other Ambulatory Visit: Payer: Self-pay

## 2022-12-23 DIAGNOSIS — L120 Bullous pemphigoid: Secondary | ICD-10-CM | POA: Diagnosis not present

## 2022-12-23 DIAGNOSIS — I5032 Chronic diastolic (congestive) heart failure: Secondary | ICD-10-CM | POA: Diagnosis present

## 2022-12-23 DIAGNOSIS — I953 Hypotension of hemodialysis: Secondary | ICD-10-CM | POA: Diagnosis present

## 2022-12-23 DIAGNOSIS — I5042 Chronic combined systolic (congestive) and diastolic (congestive) heart failure: Secondary | ICD-10-CM | POA: Diagnosis present

## 2022-12-23 DIAGNOSIS — D631 Anemia in chronic kidney disease: Secondary | ICD-10-CM | POA: Diagnosis present

## 2022-12-23 DIAGNOSIS — Z90411 Acquired partial absence of pancreas: Secondary | ICD-10-CM

## 2022-12-23 DIAGNOSIS — E8889 Other specified metabolic disorders: Secondary | ICD-10-CM | POA: Diagnosis present

## 2022-12-23 DIAGNOSIS — Y848 Other medical procedures as the cause of abnormal reaction of the patient, or of later complication, without mention of misadventure at the time of the procedure: Secondary | ICD-10-CM | POA: Diagnosis present

## 2022-12-23 DIAGNOSIS — Z90410 Acquired total absence of pancreas: Secondary | ICD-10-CM | POA: Diagnosis not present

## 2022-12-23 DIAGNOSIS — Z794 Long term (current) use of insulin: Secondary | ICD-10-CM

## 2022-12-23 DIAGNOSIS — K8681 Exocrine pancreatic insufficiency: Secondary | ICD-10-CM | POA: Diagnosis present

## 2022-12-23 DIAGNOSIS — E1322 Other specified diabetes mellitus with diabetic chronic kidney disease: Secondary | ICD-10-CM | POA: Diagnosis present

## 2022-12-23 DIAGNOSIS — Z992 Dependence on renal dialysis: Secondary | ICD-10-CM

## 2022-12-23 DIAGNOSIS — I959 Hypotension, unspecified: Secondary | ICD-10-CM | POA: Diagnosis not present

## 2022-12-23 DIAGNOSIS — N250 Renal osteodystrophy: Secondary | ICD-10-CM | POA: Diagnosis not present

## 2022-12-23 DIAGNOSIS — R627 Adult failure to thrive: Secondary | ICD-10-CM | POA: Diagnosis present

## 2022-12-23 DIAGNOSIS — E139 Other specified diabetes mellitus without complications: Secondary | ICD-10-CM | POA: Diagnosis not present

## 2022-12-23 DIAGNOSIS — N186 End stage renal disease: Secondary | ICD-10-CM | POA: Diagnosis present

## 2022-12-23 DIAGNOSIS — I2510 Atherosclerotic heart disease of native coronary artery without angina pectoris: Secondary | ICD-10-CM | POA: Diagnosis present

## 2022-12-23 DIAGNOSIS — R54 Age-related physical debility: Secondary | ICD-10-CM | POA: Diagnosis present

## 2022-12-23 DIAGNOSIS — I480 Paroxysmal atrial fibrillation: Secondary | ICD-10-CM | POA: Diagnosis not present

## 2022-12-23 DIAGNOSIS — Z888 Allergy status to other drugs, medicaments and biological substances status: Secondary | ICD-10-CM

## 2022-12-23 DIAGNOSIS — Z79899 Other long term (current) drug therapy: Secondary | ICD-10-CM

## 2022-12-23 DIAGNOSIS — K760 Fatty (change of) liver, not elsewhere classified: Secondary | ICD-10-CM | POA: Diagnosis present

## 2022-12-23 DIAGNOSIS — I132 Hypertensive heart and chronic kidney disease with heart failure and with stage 5 chronic kidney disease, or end stage renal disease: Secondary | ICD-10-CM | POA: Diagnosis present

## 2022-12-23 DIAGNOSIS — E440 Moderate protein-calorie malnutrition: Secondary | ICD-10-CM | POA: Diagnosis present

## 2022-12-23 DIAGNOSIS — R7989 Other specified abnormal findings of blood chemistry: Secondary | ICD-10-CM | POA: Diagnosis not present

## 2022-12-23 DIAGNOSIS — E891 Postprocedural hypoinsulinemia: Secondary | ICD-10-CM | POA: Diagnosis present

## 2022-12-23 DIAGNOSIS — Z881 Allergy status to other antibiotic agents status: Secondary | ICD-10-CM | POA: Diagnosis not present

## 2022-12-23 DIAGNOSIS — R5381 Other malaise: Secondary | ICD-10-CM | POA: Diagnosis not present

## 2022-12-23 DIAGNOSIS — H81399 Other peripheral vertigo, unspecified ear: Secondary | ICD-10-CM | POA: Diagnosis present

## 2022-12-23 DIAGNOSIS — I9589 Other hypotension: Secondary | ICD-10-CM | POA: Diagnosis present

## 2022-12-23 DIAGNOSIS — Z9049 Acquired absence of other specified parts of digestive tract: Secondary | ICD-10-CM

## 2022-12-23 DIAGNOSIS — G20A1 Parkinson's disease without dyskinesia, without mention of fluctuations: Secondary | ICD-10-CM | POA: Diagnosis present

## 2022-12-23 DIAGNOSIS — Z820 Family history of epilepsy and other diseases of the nervous system: Secondary | ICD-10-CM

## 2022-12-23 DIAGNOSIS — Z1152 Encounter for screening for COVID-19: Secondary | ICD-10-CM

## 2022-12-23 DIAGNOSIS — Z91011 Allergy to milk products: Secondary | ICD-10-CM

## 2022-12-23 DIAGNOSIS — N2581 Secondary hyperparathyroidism of renal origin: Secondary | ICD-10-CM | POA: Diagnosis present

## 2022-12-23 DIAGNOSIS — L03116 Cellulitis of left lower limb: Secondary | ICD-10-CM | POA: Diagnosis not present

## 2022-12-23 DIAGNOSIS — R001 Bradycardia, unspecified: Secondary | ICD-10-CM | POA: Diagnosis not present

## 2022-12-23 DIAGNOSIS — Z87442 Personal history of urinary calculi: Secondary | ICD-10-CM

## 2022-12-23 DIAGNOSIS — M7989 Other specified soft tissue disorders: Principal | ICD-10-CM

## 2022-12-23 DIAGNOSIS — T83022A Displacement of nephrostomy catheter, initial encounter: Secondary | ICD-10-CM | POA: Diagnosis not present

## 2022-12-23 DIAGNOSIS — E872 Acidosis, unspecified: Secondary | ICD-10-CM | POA: Diagnosis not present

## 2022-12-23 DIAGNOSIS — E1122 Type 2 diabetes mellitus with diabetic chronic kidney disease: Secondary | ICD-10-CM

## 2022-12-23 DIAGNOSIS — Z90710 Acquired absence of both cervix and uterus: Secondary | ICD-10-CM

## 2022-12-23 DIAGNOSIS — Z8673 Personal history of transient ischemic attack (TIA), and cerebral infarction without residual deficits: Secondary | ICD-10-CM

## 2022-12-23 DIAGNOSIS — Z885 Allergy status to narcotic agent status: Secondary | ICD-10-CM

## 2022-12-23 DIAGNOSIS — Z711 Person with feared health complaint in whom no diagnosis is made: Secondary | ICD-10-CM | POA: Diagnosis not present

## 2022-12-23 DIAGNOSIS — L03119 Cellulitis of unspecified part of limb: Secondary | ICD-10-CM | POA: Diagnosis not present

## 2022-12-23 DIAGNOSIS — Z87820 Personal history of traumatic brain injury: Secondary | ICD-10-CM

## 2022-12-23 DIAGNOSIS — G459 Transient cerebral ischemic attack, unspecified: Secondary | ICD-10-CM | POA: Diagnosis not present

## 2022-12-23 DIAGNOSIS — Z789 Other specified health status: Secondary | ICD-10-CM | POA: Diagnosis not present

## 2022-12-23 DIAGNOSIS — E46 Unspecified protein-calorie malnutrition: Secondary | ICD-10-CM | POA: Insufficient documentation

## 2022-12-23 DIAGNOSIS — Z823 Family history of stroke: Secondary | ICD-10-CM

## 2022-12-23 DIAGNOSIS — N99528 Other complication of other external stoma of urinary tract: Secondary | ICD-10-CM | POA: Diagnosis present

## 2022-12-23 DIAGNOSIS — Z515 Encounter for palliative care: Secondary | ICD-10-CM | POA: Diagnosis not present

## 2022-12-23 DIAGNOSIS — I700 Atherosclerosis of aorta: Secondary | ICD-10-CM | POA: Diagnosis not present

## 2022-12-23 DIAGNOSIS — G4733 Obstructive sleep apnea (adult) (pediatric): Secondary | ICD-10-CM | POA: Diagnosis present

## 2022-12-23 DIAGNOSIS — R410 Disorientation, unspecified: Secondary | ICD-10-CM | POA: Diagnosis not present

## 2022-12-23 DIAGNOSIS — Z7982 Long term (current) use of aspirin: Secondary | ICD-10-CM

## 2022-12-23 DIAGNOSIS — E8809 Other disorders of plasma-protein metabolism, not elsewhere classified: Secondary | ICD-10-CM | POA: Diagnosis present

## 2022-12-23 DIAGNOSIS — I509 Heart failure, unspecified: Secondary | ICD-10-CM | POA: Diagnosis not present

## 2022-12-23 DIAGNOSIS — R234 Changes in skin texture: Secondary | ICD-10-CM | POA: Diagnosis not present

## 2022-12-23 DIAGNOSIS — I679 Cerebrovascular disease, unspecified: Secondary | ICD-10-CM | POA: Diagnosis present

## 2022-12-23 DIAGNOSIS — R638 Other symptoms and signs concerning food and fluid intake: Secondary | ICD-10-CM | POA: Diagnosis not present

## 2022-12-23 DIAGNOSIS — Z8249 Family history of ischemic heart disease and other diseases of the circulatory system: Secondary | ICD-10-CM

## 2022-12-23 DIAGNOSIS — Z6822 Body mass index (BMI) 22.0-22.9, adult: Secondary | ICD-10-CM

## 2022-12-23 DIAGNOSIS — K219 Gastro-esophageal reflux disease without esophagitis: Secondary | ICD-10-CM | POA: Diagnosis present

## 2022-12-23 DIAGNOSIS — Z8507 Personal history of malignant neoplasm of pancreas: Secondary | ICD-10-CM

## 2022-12-23 DIAGNOSIS — Z7189 Other specified counseling: Secondary | ICD-10-CM | POA: Diagnosis not present

## 2022-12-23 DIAGNOSIS — I611 Nontraumatic intracerebral hemorrhage in hemisphere, cortical: Secondary | ICD-10-CM | POA: Diagnosis not present

## 2022-12-23 DIAGNOSIS — G9389 Other specified disorders of brain: Secondary | ICD-10-CM | POA: Diagnosis not present

## 2022-12-23 LAB — COMPREHENSIVE METABOLIC PANEL
ALT: 104 U/L — ABNORMAL HIGH (ref 0–44)
AST: 63 U/L — ABNORMAL HIGH (ref 15–41)
Albumin: 1.9 g/dL — ABNORMAL LOW (ref 3.5–5.0)
Alkaline Phosphatase: 128 U/L — ABNORMAL HIGH (ref 38–126)
Anion gap: 12 (ref 5–15)
BUN: 17 mg/dL (ref 8–23)
CO2: 29 mmol/L (ref 22–32)
Calcium: 7.5 mg/dL — ABNORMAL LOW (ref 8.9–10.3)
Chloride: 100 mmol/L (ref 98–111)
Creatinine, Ser: 2.99 mg/dL — ABNORMAL HIGH (ref 0.44–1.00)
GFR, Estimated: 15 mL/min — ABNORMAL LOW (ref >60)
Glucose, Bld: 174 mg/dL — ABNORMAL HIGH (ref 70–99)
Potassium: 4 mmol/L (ref 3.5–5.1)
Sodium: 141 mmol/L (ref 135–145)
Total Bilirubin: 0.6 mg/dL (ref <1.2)
Total Protein: 4.6 g/dL — ABNORMAL LOW (ref 6.5–8.1)

## 2022-12-23 LAB — CBC WITH DIFFERENTIAL/PLATELET
Abs Immature Granulocytes: 0.04 10*3/uL (ref 0.00–0.07)
Basophils Absolute: 0 10*3/uL (ref 0.0–0.1)
Basophils Relative: 0 %
Eosinophils Absolute: 0.1 10*3/uL (ref 0.0–0.5)
Eosinophils Relative: 1 %
HCT: 28 % — ABNORMAL LOW (ref 36.0–46.0)
Hemoglobin: 8.6 g/dL — ABNORMAL LOW (ref 12.0–15.0)
Immature Granulocytes: 0 %
Lymphocytes Relative: 11 %
Lymphs Abs: 1 10*3/uL (ref 0.7–4.0)
MCH: 32 pg (ref 26.0–34.0)
MCHC: 30.7 g/dL (ref 30.0–36.0)
MCV: 104.1 fL — ABNORMAL HIGH (ref 80.0–100.0)
Monocytes Absolute: 0.5 10*3/uL (ref 0.1–1.0)
Monocytes Relative: 5 %
Neutro Abs: 7.5 10*3/uL (ref 1.7–7.7)
Neutrophils Relative %: 83 %
Platelets: 178 10*3/uL (ref 150–400)
RBC: 2.69 MIL/uL — ABNORMAL LOW (ref 3.87–5.11)
RDW: 16 % — ABNORMAL HIGH (ref 11.5–15.5)
WBC: 9.1 10*3/uL (ref 4.0–10.5)
nRBC: 0 % (ref 0.0–0.2)

## 2022-12-23 LAB — URINALYSIS, ROUTINE W REFLEX MICROSCOPIC
Bilirubin Urine: NEGATIVE
Glucose, UA: NEGATIVE mg/dL
Hgb urine dipstick: NEGATIVE
Ketones, ur: NEGATIVE mg/dL
Nitrite: NEGATIVE
Protein, ur: 100 mg/dL — AB
Specific Gravity, Urine: 1.012 (ref 1.005–1.030)
pH: 7 (ref 5.0–8.0)

## 2022-12-23 LAB — LACTIC ACID, PLASMA
Lactic Acid, Venous: 3.3 mmol/L (ref 0.5–1.9)
Lactic Acid, Venous: 3 mmol/L (ref 0.5–1.9)

## 2022-12-23 LAB — CBG MONITORING, ED: Glucose-Capillary: 128 mg/dL — ABNORMAL HIGH (ref 70–99)

## 2022-12-23 MED ORDER — SODIUM CHLORIDE 0.9 % IV SOLN
2.0000 g | Freq: Once | INTRAVENOUS | Status: AC
  Administered 2022-12-24: 2 g via INTRAVENOUS
  Filled 2022-12-23: qty 12.5

## 2022-12-23 MED ORDER — VANCOMYCIN HCL 1250 MG/250ML IV SOLN
1250.0000 mg | Freq: Once | INTRAVENOUS | Status: AC
  Administered 2022-12-24: 1250 mg via INTRAVENOUS
  Filled 2022-12-23: qty 250

## 2022-12-23 MED ORDER — SODIUM CHLORIDE 0.9 % IV BOLUS
500.0000 mL | Freq: Once | INTRAVENOUS | Status: AC
  Administered 2022-12-24: 500 mL via INTRAVENOUS

## 2022-12-23 NOTE — ED Provider Triage Note (Signed)
Emergency Medicine Provider Triage Evaluation Note  PRESTYN MAHN , a 87 y.o. female  was evaluated in triage.  Pt complains of bilateral lower extremity edema.  Review of Systems  Positive: Bilateral lower extremity edema Negative: Chest pain, shortness of breath  Physical Exam  BP 138/80   Pulse 61   Temp 97.9 F (36.6 C) (Oral)   Resp 16   SpO2 95%  Gen:   Awake, no distress   Resp:  Normal effort  MSK:   Moves extremities without difficulty  Other:    Medical Decision Making  Medically screening exam initiated at 7:00 PM.  Appropriate orders placed.  BINTA STATZER was informed that the remainder of the evaluation will be completed by another provider, this initial triage assessment does not replace that evaluation, and the importance of remaining in the ED until their evaluation is complete.  Patient overall well-appearing not reporting additional symptoms. Patient reporting with increased bilateral lower extremity edema over the past 2 to 3 weeks.  Now reporting blistering of the skin and concern for increased redness.  Patient was seen in urgent care and was sent here due to concern of infection.  Patient denies any fevers, chills.  Patient is dialysis patient and reports they have had trouble taking fluid off secondary to hypotension that occurs during dialysis.  Patient and family are concerned that due to having to stop dialysis patient started to accumulate fluid.  Denies shortness of breath.   Reinaldo Raddle 12/23/22 1902

## 2022-12-23 NOTE — ED Triage Notes (Signed)
Pt reports bilateral leg swelling and blisters on her legs. Pt has red swollen blisters on bilateral lower extremities. Pt denies fevers.

## 2022-12-23 NOTE — ED Provider Notes (Incomplete)
[place] EMERGENCY DEPARTMENT AT [facility] Provider Note   CSN: [id number] Arrival date & time: 12/23/22  1634     History {Add pertinent medical, surgical, social history, OB history to HPI:1} Chief Complaint  Patient presents with  . Leg Swelling    Kim Burns is a 87 y.o. female with history of ESRD on HD (M, W, F) status post left nephrostomy me exchange 12/16/2022, type II DM, CHF, A-fib  HPI Past Medical History:  Diagnosis Date  . Anemia of chronic disease    takes iron  . Anxiety   . Blood transfusion without reported diagnosis   . Bradycardia   . Diabetes mellitus without complication (HCC)    became diabetic after Whipple procedure  . Diarrhea   . Dysrhythmia 04/16/2016   bradycardia due to medication   . ESRD on hemodialysis (HCC)    M-W-F  . GERD (gastroesophageal reflux disease)   . Gout   . Headache   . History of kidney stones 06/2013  . Hyperparathyroidism (HCC)   . Hypertension   . PONV (postoperative nausea and vomiting)   . Sleep apnea    no cpap machine. could not tolerate  . Stroke (HCC) 04/16/2016   TIA 1995  . Vitamin D deficiency        Home Medications Prior to Admission medications   Medication Sig Start Date End Date Taking? Authorizing Provider  acetaminophen (TYLENOL) 500 MG tablet Take 1 tablet (500 mg total) by mouth 3 (three) times daily. 11/13/22   Love, Evlyn Kanner, PA-C  amantadine (SYMMETREL) 100 MG capsule Take 1 capsule (100 mg total) by mouth once a week. 11/20/22   Love, Evlyn Kanner, PA-C  amiodarone (PACERONE) 200 MG tablet Take 1 tablet (200 mg total) by mouth daily. 11/15/22   Love, Evlyn Kanner, PA-C  aspirin 81 MG chewable tablet Chew 1 tablet (81 mg total) by mouth daily. 11/13/22   Love, Evlyn Kanner, PA-C  B Complex-C-Zn-Folic Acid (DIALYVITE 800 WITH ZINC) 0.8 MG TABS Take 1 tablet by mouth daily.    [provider]  benzonatate (TESSALON) 100 MG capsule Take 1 capsule (100 mg total) by mouth 3 (three)  times daily as needed for cough. Patient not taking: Reported on 12/16/2022 11/13/22   Love, Evlyn Kanner, PA-C  cycloSPORINE (RESTASIS) 0.05 % ophthalmic emulsion Place 1 drop into both eyes daily as needed (irritation). 11/13/22   Love, Evlyn Kanner, PA-C  guaiFENesin (MUCINEX) 600 MG 12 hr tablet Take 2 tablets (1,200 mg total) by mouth 2 (two) times daily. Patient not taking: Reported on 12/16/2022 11/13/22   Love, Evlyn Kanner, PA-C  lidocaine (LIDODERM) 5 % Place 1 patch onto the skin daily. Remove & Discard patch within 12 hours or as directed by MD Patient not taking: Reported on 12/16/2022 11/14/22   Jacquelynn Cree, PA-C  lidocaine-prilocaine (EMLA) cream Apply 1 Application topically every Monday, Wednesday, and Friday with hemodialysis.    [provider]  lipase/protease/amylase (CREON) 36000 UNITS CPEP capsule Take 36,000-72,000 Units by mouth See admin instructions. Take 72,000 units (2 capsules) by mouth with meals and 36,000 units (1 capsule) with snacks.    [provider]  meclizine (ANTIVERT) 12.5 MG tablet Take 1 tablet (12.5 mg total) by mouth 3 (three) times daily as needed for dizziness. Patient not taking: Reported on 12/16/2022 10/30/22   Miguel Rota, MD  metoprolol succinate (TOPROL-XL) 25 MG 24 hr tablet Take 1 tablet (25 mg total) by mouth daily.  Patient taking differently: Take 25 mg by mouth daily. Metoprolol if systolic BP (top number) is less than 110. 11/29/22   Jacalyn Lefevre L, NP  olopatadine (PATADAY) 0.1 % ophthalmic solution Place 1 drop into both eyes 2 (two) times daily as needed for allergies. Patient not taking: Reported on 12/16/2022    [provider]  rOPINIRole (REQUIP) 0.25 MG tablet Take 1 tablet (0.25 mg total) by mouth 2 (two) times daily. Patient taking differently: Take 0.25-0.5 mg by mouth See admin instructions. Take 0.5mg  (1 tablet) by mouth every night at bedtime and 0.25mg  (1/2 tablet) in the mornings as needed. 09/24/22   Rodolph Bong, MD  sevelamer carbonate (RENVELA) 800 MG tablet Take 1 tablet (800 mg total) by mouth 3 (three) times daily with meals. 11/13/22   Love, Evlyn Kanner, PA-C  valACYclovir (VALTREX) 500 MG tablet Take 1 tablet (500 mg total) by mouth at bedtime. Patient not taking: Reported on 12/16/2022 09/24/22   Rodolph Bong, MD      Allergies    Lopid [gemfibrozil], Lotemax [loteprednol etabonate], Statins, Norco [hydrocodone-acetaminophen], Other, Vibra-tab [doxycycline], Amlodipine, Codeine, Hydrocodone, Hydrocodone-acetaminophen, Lisinopril, and Verapamil    Review of Systems   Review of Systems  Physical Exam Updated Vital Signs BP 138/80   Pulse 61   Temp 97.9 F (36.6 C) (Oral)   Resp 16   SpO2 95%  Physical Exam  ED Results / Procedures / Treatments   Labs (all labs ordered are listed, but only abnormal results are displayed) Labs Reviewed  URINALYSIS, ROUTINE W REFLEX MICROSCOPIC - Abnormal; Notable for the following components:      Result Value   APPearance HAZY (*)    Protein, ur 100 (*)    Leukocytes,Ua TRACE (*)    Bacteria, UA RARE (*)    All other components within normal limits  COMPREHENSIVE METABOLIC PANEL - Abnormal; Notable for the following components:   Glucose, Bld 174 (*)    Creatinine, Ser 2.99 (*)    Calcium 7.5 (*)    Total Protein 4.6 (*)    Albumin 1.9 (*)    AST 63 (*)    ALT 104 (*)    Alkaline Phosphatase 128 (*)    GFR, Estimated 15 (*)    All other components within normal limits  CBC WITH DIFFERENTIAL/PLATELET - Abnormal; Notable for the following components:   RBC 2.69 (*)    Hemoglobin 8.6 (*)    HCT 28.0 (*)    MCV 104.1 (*)    RDW 16.0 (*)    All other components within normal limits  LACTIC ACID, PLASMA - Abnormal; Notable for the following components:   Lactic Acid, Venous 3.3 (*)    All other components within normal limits  CBG MONITORING, ED - Abnormal; Notable for the following components:   Glucose-Capillary 128 (*)     All other components within normal limits  LACTIC ACID, PLASMA    EKG None  Radiology No results found.  Procedures Procedures  {Document cardiac monitor, telemetry assessment procedure when appropriate:1}  Medications Ordered in ED Medications - No data to display  ED Course/ Medical Decision Making/ A&P   {   Click here for ABCD2, HEART and other calculatorsREFRESH Note before signing :1}                              Medical Decision Making  ***  {Document critical care time when appropriate:1} {  Document review of labs and clinical decision tools ie heart score, Chads2Vasc2 etc:1}  {Document your independent review of radiology images, and any outside records:1} {Document your discussion with family members, caretakers, and with consultants:1} {Document social determinants of health affecting pt's care:1} {Document your decision making why or why not admission, treatments were needed:1} Final Clinical Impression(s) / ED Diagnoses Final diagnoses:  None    Rx / DC Orders ED Discharge Orders     None

## 2022-12-23 NOTE — ED Notes (Signed)
Lactic 3.3 reported to [facility].

## 2022-12-23 NOTE — Progress Notes (Signed)
ED Pharmacy Antibiotic Sign Off An antibiotic consult was received from an ED provider for cefepime and vancomycin per pharmacy dosing for wound infection. A chart review was completed to assess appropriateness.   The following one time order(s) were placed:  Cefepime 2g x 1 Vancomycin 1250mg  x 1  Further antibiotic and/or antibiotic pharmacy consults should be ordered by the admitting provider if indicated.   Thank you for allowing pharmacy to be a part of this patient's care.   Marja Kays, [facility]  Clinical Pharmacist 12/23/22 11:46 PM

## 2022-12-23 NOTE — ED Provider Notes (Signed)
Kim Burns   CSN: [id number] Arrival date & time: 12/23/22  1634     History  Chief Complaint  Patient presents with   Leg Swelling    Kim Burns is a 87 y.o. female with history of ESRD on HD (M, W, F) status post left nephrostomy exchange 12/16/2022, type II DM, CHF, A-fib on ASA, recent pelvic, sacral and occipital fractures treated conservatively 9/24 presents with complaints of bilateral leg swelling and pain.  Patient states her symptoms have worsened over the past week.  She denies any injury or trauma.  Denies any fevers or chills, chest pain, shortness of breath, or abdominal pain.  She is accompanied by her son who states there has been a lack of clarity between her cardiologist and nephrologist on her medication regiment that she has not been on her fluid pill in the past few months.  HPI Past Medical History:  Diagnosis Date   Anemia of chronic disease    takes iron   Anxiety    Blood transfusion without reported diagnosis    Bradycardia    Diabetes mellitus without complication (HCC)    became diabetic after Whipple procedure   Diarrhea    Dysrhythmia 04/16/2016   bradycardia due to medication    ESRD on hemodialysis [facility])    M-W-F   GERD (gastroesophageal reflux disease)    Gout    Headache    History of kidney stones 06/2013   Hyperparathyroidism (HCC)    Hypertension    PONV (postoperative nausea and vomiting)    Sleep apnea    no cpap machine. could not tolerate   Stroke (HCC) 04/16/2016   TIA 1995   Vitamin D deficiency        Home Medications Prior to Admission medications   Medication Sig Start Date End Date Taking? Authorizing Provider  acetaminophen (TYLENOL) 500 MG tablet Take 1 tablet (500 mg total) by mouth 3 (three) times daily. 11/13/22   Love, Evlyn Kanner, PA-C  amantadine (SYMMETREL) 100 MG capsule Take 1 capsule (100 mg total) by mouth once a week. 11/20/22   Love, Evlyn Kanner,  PA-C  amiodarone (PACERONE) 200 MG tablet Take 1 tablet (200 mg total) by mouth daily. 11/15/22   Love, Evlyn Kanner, PA-C  aspirin 81 MG chewable tablet Chew 1 tablet (81 mg total) by mouth daily. 11/13/22   Love, Evlyn Kanner, PA-C  B Complex-C-Zn-Folic Acid (DIALYVITE 800 WITH ZINC) 0.8 MG TABS Take 1 tablet by mouth daily.    [provider]  benzonatate (TESSALON) 100 MG capsule Take 1 capsule (100 mg total) by mouth 3 (three) times daily as needed for cough. Patient not taking: Reported on 12/16/2022 11/13/22   Love, Evlyn Kanner, PA-C  cycloSPORINE (RESTASIS) 0.05 % ophthalmic emulsion Place 1 drop into both eyes daily as needed (irritation). 11/13/22   Love, Evlyn Kanner, PA-C  guaiFENesin (MUCINEX) 600 MG 12 hr tablet Take 2 tablets (1,200 mg total) by mouth 2 (two) times daily. Patient not taking: Reported on 12/16/2022 11/13/22   Love, Evlyn Kanner, PA-C  lidocaine (LIDODERM) 5 % Place 1 patch onto the skin daily. Remove & Discard patch within 12 hours or as directed by MD Patient not taking: Reported on 12/16/2022 11/14/22   Jacquelynn Cree, PA-C  lidocaine-prilocaine (EMLA) cream Apply 1 Application topically every Monday, Wednesday, and Friday with hemodialysis.    [provider]  lipase/protease/amylase (CREON) 36000 UNITS CPEP capsule Take 36,000-72,000  Units by mouth See admin instructions. Take 72,000 units (2 capsules) by mouth with meals and 36,000 units (1 capsule) with snacks.    [provider]  meclizine (ANTIVERT) 12.5 MG tablet Take 1 tablet (12.5 mg total) by mouth 3 (three) times daily as needed for dizziness. Patient not taking: Reported on 12/16/2022 10/30/22   Miguel Rota, MD  metoprolol succinate (TOPROL-XL) 25 MG 24 hr tablet Take 1 tablet (25 mg total) by mouth daily. Patient taking differently: Take 25 mg by mouth daily. Metoprolol if systolic BP (top number) is less than 110. 11/29/22   Jacalyn Lefevre L, NP  olopatadine (PATADAY) 0.1 % ophthalmic solution Place  1 drop into both eyes 2 (two) times daily as needed for allergies. Patient not taking: Reported on 12/16/2022    [provider]  rOPINIRole (REQUIP) 0.25 MG tablet Take 1 tablet (0.25 mg total) by mouth 2 (two) times daily. Patient taking differently: Take 0.25-0.5 mg by mouth See admin instructions. Take 0.5mg  (1 tablet) by mouth every night at bedtime and 0.25mg  (1/2 tablet) in the mornings as needed. 09/24/22   Rodolph Bong, MD  sevelamer carbonate (RENVELA) 800 MG tablet Take 1 tablet (800 mg total) by mouth 3 (three) times daily with meals. 11/13/22   Love, Evlyn Kanner, PA-C  valACYclovir (VALTREX) 500 MG tablet Take 1 tablet (500 mg total) by mouth at bedtime. Patient not taking: Reported on 12/16/2022 09/24/22   Rodolph Bong, MD      Allergies    Lopid [gemfibrozil], Lotemax [loteprednol etabonate], Statins, Norco [hydrocodone-acetaminophen], Other, Vibra-tab [doxycycline], Amlodipine, Codeine, Hydrocodone, Hydrocodone-acetaminophen, Lisinopril, and Verapamil    Review of Systems   Review of Systems  Constitutional:  Negative for chills, fatigue and fever.  Respiratory:  Negative for shortness of breath.   Cardiovascular:  Negative for chest pain.  Gastrointestinal:  Negative for abdominal pain and blood in stool.  Genitourinary:  Negative for dysuria and frequency.    Physical Exam Updated Vital Signs BP (!) 162/76 (BP Location: Right Arm)   Pulse 63   Temp 97.7 F (36.5 C)   Resp 17   SpO2 100%  Physical Exam Vitals and nursing Burns reviewed.  Constitutional:      Appearance: She is well-developed.     Comments: Chronically ill-appearing in no acute distress  HENT:     Head: Normocephalic and atraumatic.  Eyes:     Conjunctiva/sclera: Conjunctivae normal.  Cardiovascular:     Rate and Rhythm: Normal rate and regular rhythm.     Heart sounds: No murmur heard. Pulmonary:     Effort: Pulmonary effort is normal. No respiratory distress.     Breath sounds:  Normal breath sounds.  Abdominal:     Palpations: Abdomen is soft.     Tenderness: There is no abdominal tenderness.  Musculoskeletal:        General: No swelling.     Cervical back: Neck supple.     Right lower leg: Edema present.     Left lower leg: Edema present.  Skin:    General: Skin is warm and dry.     Capillary Refill: Capillary refill takes less than 2 seconds.     Comments: Ecchymosis and erythema about the anterior left shin with two 2-3 cm bullae, mildly warmer to touch between bullae compared to contralateral shin.  No purulence or open wound  Neurological:     General: No focal deficit present.     Mental Status: She is alert  and oriented to person, place, and time. Mental status is at baseline.  Psychiatric:        Mood and Affect: Mood normal.     ED Results / Procedures / Treatments   Labs (all labs ordered are listed, but only abnormal results are displayed) Labs Reviewed  URINALYSIS, ROUTINE W REFLEX MICROSCOPIC - Abnormal; Notable for the following components:      Result Value   APPearance HAZY (*)    Protein, ur 100 (*)    Leukocytes,Ua TRACE (*)    Bacteria, UA RARE (*)    All other components within normal limits  COMPREHENSIVE METABOLIC PANEL - Abnormal; Notable for the following components:   Glucose, Bld 174 (*)    Creatinine, Ser 2.99 (*)    Calcium 7.5 (*)    Total Protein 4.6 (*)    Albumin 1.9 (*)    AST 63 (*)    ALT 104 (*)    Alkaline Phosphatase 128 (*)    GFR, Estimated 15 (*)    All other components within normal limits  CBC WITH DIFFERENTIAL/PLATELET - Abnormal; Notable for the following components:   RBC 2.69 (*)    Hemoglobin 8.6 (*)    HCT 28.0 (*)    MCV 104.1 (*)    RDW 16.0 (*)    All other components within normal limits  LACTIC ACID, PLASMA - Abnormal; Notable for the following components:   Lactic Acid, Venous 3.3 (*)    All other components within normal limits  LACTIC ACID, PLASMA - Abnormal; Notable for the  following components:   Lactic Acid, Venous 3.0 (*)    All other components within normal limits  BRAIN NATRIURETIC PEPTIDE - Abnormal; Notable for the following components:   B Natriuretic Peptide 1,423.9 (*)    All other components within normal limits  COMPREHENSIVE METABOLIC PANEL - Abnormal; Notable for the following components:   Creatinine, Ser 3.21 (*)    Calcium 7.3 (*)    Total Protein 4.2 (*)    Albumin 1.7 (*)    AST 52 (*)    ALT 90 (*)    GFR, Estimated 14 (*)    All other components within normal limits  CBC - Abnormal; Notable for the following components:   RBC 2.42 (*)    Hemoglobin 7.8 (*)    HCT 25.4 (*)    MCV 105.0 (*)    RDW 15.9 (*)    All other components within normal limits  RETICULOCYTES - Abnormal; Notable for the following components:   Retic Ct Pct 3.4 (*)    RBC. 2.40 (*)    All other components within normal limits  LACTIC ACID, PLASMA - Abnormal; Notable for the following components:   Lactic Acid, Venous 2.4 (*)    All other components within normal limits  CBG MONITORING, ED - Abnormal; Notable for the following components:   Glucose-Capillary 128 (*)    All other components within normal limits  RESP PANEL BY RT-PCR (RSV, FLU A&B, COVID)  RVPGX2  CULTURE, BLOOD (ROUTINE X 2)  CULTURE, BLOOD (ROUTINE X 2)  VITAMIN B12  FOLATE  IRON AND TIBC  FERRITIN  HEPATITIS PANEL, ACUTE    EKG None  Radiology US Abdomen Limited RUQ (LIVER/GB)  Result Date: 12/24/2022 CLINICAL DATA:  Elevated LFTs. EXAM: ULTRASOUND ABDOMEN LIMITED RIGHT UPPER QUADRANT COMPARISON:  CT scan 06/20/2022.  Abdominal ultrasound 05/17/2020 FINDINGS: Gallbladder: Surgically absent. Common bile duct: Diameter: 6 mm Liver: Diffusely increased echogenicity of liver  parenchyma without a discrete or focal abnormality. Portal vein is patent on color Doppler imaging with normal direction of blood flow towards the liver. Other: Cystic change noted right kidney. IMPRESSION: 1.  Diffusely increased echogenicity of liver parenchyma without a discrete or focal abnormality. Imaging features compatible with hepatic steatosis. 2. Status post cholecystectomy. 3. Cystic changes noted in the right kidney, better characterized on previous CT imaging. Electronically Signed   By: Kennith Center M.D.   On: 12/24/2022 06:20   DG Chest 2 View  Result Date: 12/24/2022 CLINICAL DATA:  Lactic acidosis EXAM: CHEST - 2 VIEW COMPARISON:  10/21/2022 FINDINGS: Lungs are well expanded, symmetric, and clear. No pneumothorax or pleural effusion. Cardiac size within normal limits. Pulmonary vascularity is normal. Osseous structures are age-appropriate. No acute bone abnormality. IMPRESSION: No active cardiopulmonary disease. Electronically Signed   By: Helyn Numbers M.D.   On: 12/24/2022 00:05    Procedures Procedures    Medications Ordered in ED Medications  aspirin chewable tablet 81 mg (has no administration in time range)  amiodarone (PACERONE) tablet 200 mg (has no administration in time range)  sevelamer carbonate (RENVELA) tablet 800 mg (has no administration in time range)  cycloSPORINE (RESTASIS) 0.05 % ophthalmic emulsion 1 drop (has no administration in time range)  cefTRIAXone (ROCEPHIN) 2 g in sodium chloride 0.9 % 100 mL IVPB (has no administration in time range)  insulin aspart (novoLOG) injection 0-6 Units (has no administration in time range)  insulin aspart (novoLOG) injection 0-5 Units (has no administration in time range)  heparin injection 5,000 Units (5,000 Units Subcutaneous Not Given 12/24/22 0617)  sodium chloride flush (NS) 0.9 % injection 3 mL (3 mLs Intravenous Not Given 12/24/22 0430)  rOPINIRole (REQUIP) tablet 0.25 mg (has no administration in time range)    And  rOPINIRole (REQUIP) tablet 0.5 mg (has no administration in time range)  lipase/protease/amylase (CREON) capsule 72,000 Units (has no administration in time range)    And  lipase/protease/amylase  (CREON) capsule 36,000 Units (has no administration in time range)  sodium chloride 0.9 % bolus 500 mL (0 mLs Intravenous Stopped 12/24/22 0224)  ceFEPIme (MAXIPIME) 2 g in sodium chloride 0.9 % 100 mL IVPB (0 g Intravenous Stopped 12/24/22 0128)  vancomycin (VANCOREADY) IVPB 1250 mg/250 mL (0 mg Intravenous Stopped 12/24/22 0321)  acetaminophen (TYLENOL) tablet 650 mg (650 mg Oral Given 12/24/22 0331)    ED Course/ Medical Decision Making/ A&P                                 Medical Decision Making Amount and/or Complexity of Data Reviewed Labs: ordered. Radiology: ordered.  Risk Prescription drug management. Decision regarding hospitalization.    Kim Burns is a 87 y.o. female with history of ESRD on HD (M, W, F) status post left nephrostomy exchange 12/16/2022, type II DM, CHF, A-fib on ASA, recent pelvic, sacral and occipital fractures treated conservatively 9/24 presents with complaints of bilateral leg swelling and pain. Differential includes cellulitis, DVT, venous stasis, decompensated CHF, DM ulcer, sepsis, pneumonia, UTI, pyelonephritis.  Additional historians include son at bedside.  External records include chart review of recent hospitalizations.  Patient is hemodynamically stable, afebrile.  On exam she has notable 2-3+ edema bilaterally with skin changes concerning for cellulitis on Left lower extremity.  Workup notable for elevated lactate of 3.3.  Conversely she has no white count.  She is anemic below baseline at 8.6.  Unclear if this is secondary to ESRD.  Denies any dark or bloody stools.  She does have a history of prior GI bleed admission.  Will continue to monitor.  BNP elevated at 1400 but at baseline.  UA is ambiguous with trace leuks, some WBCs.  She is status post recent nephrostomy replacement.  Site is without obvious overlying skin infection.  Chest x-ray does not reveal pneumonia.  PERC negative. She is additionally without any respiratory symptoms.  DVT  unlikely due to bilateral lower extremity edema. [place] evaluation is additionally currently limited at this facility. Will defer at this time. Given overall concerning picture for infectious process for presumed cellulitis, empiric antibiotics started with vancomycin and cefepime, patient additionally given half liter of normal saline for supportive care. Dr. Antionette Char consulted for admission.           Final Clinical Impression(s) / ED Diagnoses Final diagnoses:  Leg swelling    Rx / DC Orders ED Discharge Orders     None         Halford Decamp, PA-C 12/24/22 8119    Blane Ohara, MD 12/30/22 [phone]

## 2022-12-24 ENCOUNTER — Encounter (HOSPITAL_COMMUNITY): Payer: Self-pay | Admitting: Family Medicine

## 2022-12-24 ENCOUNTER — Inpatient Hospital Stay (HOSPITAL_COMMUNITY): Payer: Medicare PPO

## 2022-12-24 DIAGNOSIS — Z515 Encounter for palliative care: Secondary | ICD-10-CM | POA: Diagnosis not present

## 2022-12-24 DIAGNOSIS — I953 Hypotension of hemodialysis: Secondary | ICD-10-CM | POA: Diagnosis present

## 2022-12-24 DIAGNOSIS — E139 Other specified diabetes mellitus without complications: Secondary | ICD-10-CM

## 2022-12-24 DIAGNOSIS — E8809 Other disorders of plasma-protein metabolism, not elsewhere classified: Secondary | ICD-10-CM

## 2022-12-24 DIAGNOSIS — E891 Postprocedural hypoinsulinemia: Secondary | ICD-10-CM

## 2022-12-24 DIAGNOSIS — K760 Fatty (change of) liver, not elsewhere classified: Secondary | ICD-10-CM | POA: Diagnosis present

## 2022-12-24 DIAGNOSIS — I480 Paroxysmal atrial fibrillation: Secondary | ICD-10-CM

## 2022-12-24 DIAGNOSIS — Z992 Dependence on renal dialysis: Secondary | ICD-10-CM | POA: Diagnosis not present

## 2022-12-24 DIAGNOSIS — E440 Moderate protein-calorie malnutrition: Secondary | ICD-10-CM | POA: Diagnosis present

## 2022-12-24 DIAGNOSIS — N186 End stage renal disease: Secondary | ICD-10-CM

## 2022-12-24 DIAGNOSIS — H81399 Other peripheral vertigo, unspecified ear: Secondary | ICD-10-CM | POA: Diagnosis present

## 2022-12-24 DIAGNOSIS — N99528 Other complication of other external stoma of urinary tract: Secondary | ICD-10-CM | POA: Diagnosis present

## 2022-12-24 DIAGNOSIS — Z1152 Encounter for screening for COVID-19: Secondary | ICD-10-CM | POA: Diagnosis not present

## 2022-12-24 DIAGNOSIS — G4733 Obstructive sleep apnea (adult) (pediatric): Secondary | ICD-10-CM | POA: Insufficient documentation

## 2022-12-24 DIAGNOSIS — I679 Cerebrovascular disease, unspecified: Secondary | ICD-10-CM | POA: Insufficient documentation

## 2022-12-24 DIAGNOSIS — K8681 Exocrine pancreatic insufficiency: Secondary | ICD-10-CM | POA: Diagnosis present

## 2022-12-24 DIAGNOSIS — D631 Anemia in chronic kidney disease: Secondary | ICD-10-CM | POA: Diagnosis present

## 2022-12-24 DIAGNOSIS — Y848 Other medical procedures as the cause of abnormal reaction of the patient, or of later complication, without mention of misadventure at the time of the procedure: Secondary | ICD-10-CM | POA: Diagnosis present

## 2022-12-24 DIAGNOSIS — E46 Unspecified protein-calorie malnutrition: Secondary | ICD-10-CM | POA: Insufficient documentation

## 2022-12-24 DIAGNOSIS — L03116 Cellulitis of left lower limb: Secondary | ICD-10-CM | POA: Diagnosis present

## 2022-12-24 DIAGNOSIS — N2581 Secondary hyperparathyroidism of renal origin: Secondary | ICD-10-CM | POA: Diagnosis present

## 2022-12-24 DIAGNOSIS — R7989 Other specified abnormal findings of blood chemistry: Secondary | ICD-10-CM | POA: Diagnosis present

## 2022-12-24 DIAGNOSIS — Z881 Allergy status to other antibiotic agents status: Secondary | ICD-10-CM | POA: Diagnosis not present

## 2022-12-24 DIAGNOSIS — I132 Hypertensive heart and chronic kidney disease with heart failure and with stage 5 chronic kidney disease, or end stage renal disease: Secondary | ICD-10-CM | POA: Diagnosis present

## 2022-12-24 DIAGNOSIS — E8889 Other specified metabolic disorders: Secondary | ICD-10-CM | POA: Diagnosis present

## 2022-12-24 DIAGNOSIS — R54 Age-related physical debility: Secondary | ICD-10-CM | POA: Diagnosis present

## 2022-12-24 DIAGNOSIS — I959 Hypotension, unspecified: Secondary | ICD-10-CM | POA: Diagnosis not present

## 2022-12-24 DIAGNOSIS — R627 Adult failure to thrive: Secondary | ICD-10-CM | POA: Diagnosis present

## 2022-12-24 DIAGNOSIS — I5042 Chronic combined systolic (congestive) and diastolic (congestive) heart failure: Secondary | ICD-10-CM | POA: Diagnosis present

## 2022-12-24 DIAGNOSIS — Z7189 Other specified counseling: Secondary | ICD-10-CM | POA: Diagnosis not present

## 2022-12-24 DIAGNOSIS — Z794 Long term (current) use of insulin: Secondary | ICD-10-CM | POA: Diagnosis not present

## 2022-12-24 DIAGNOSIS — Z90410 Acquired total absence of pancreas: Secondary | ICD-10-CM

## 2022-12-24 DIAGNOSIS — M7989 Other specified soft tissue disorders: Secondary | ICD-10-CM | POA: Diagnosis present

## 2022-12-24 DIAGNOSIS — G20A1 Parkinson's disease without dyskinesia, without mention of fluctuations: Secondary | ICD-10-CM | POA: Diagnosis present

## 2022-12-24 DIAGNOSIS — E1322 Other specified diabetes mellitus with diabetic chronic kidney disease: Secondary | ICD-10-CM | POA: Diagnosis present

## 2022-12-24 DIAGNOSIS — R5381 Other malaise: Secondary | ICD-10-CM | POA: Diagnosis not present

## 2022-12-24 LAB — COMPREHENSIVE METABOLIC PANEL
ALT: 90 U/L — ABNORMAL HIGH (ref 0–44)
AST: 52 U/L — ABNORMAL HIGH (ref 15–41)
Albumin: 1.7 g/dL — ABNORMAL LOW (ref 3.5–5.0)
Alkaline Phosphatase: 112 U/L (ref 38–126)
Anion gap: 12 (ref 5–15)
BUN: 21 mg/dL (ref 8–23)
CO2: 27 mmol/L (ref 22–32)
Calcium: 7.3 mg/dL — ABNORMAL LOW (ref 8.9–10.3)
Chloride: 103 mmol/L (ref 98–111)
Creatinine, Ser: 3.21 mg/dL — ABNORMAL HIGH (ref 0.44–1.00)
GFR, Estimated: 14 mL/min — ABNORMAL LOW (ref >60)
Glucose, Bld: 94 mg/dL (ref 70–99)
Potassium: 3.8 mmol/L (ref 3.5–5.1)
Sodium: 142 mmol/L (ref 135–145)
Total Bilirubin: 0.6 mg/dL (ref <1.2)
Total Protein: 4.2 g/dL — ABNORMAL LOW (ref 6.5–8.1)

## 2022-12-24 LAB — CBC
HCT: 25.4 % — ABNORMAL LOW (ref 36.0–46.0)
Hemoglobin: 7.8 g/dL — ABNORMAL LOW (ref 12.0–15.0)
MCH: 32.2 pg (ref 26.0–34.0)
MCHC: 30.7 g/dL (ref 30.0–36.0)
MCV: 105 fL — ABNORMAL HIGH (ref 80.0–100.0)
Platelets: 161 10*3/uL (ref 150–400)
RBC: 2.42 MIL/uL — ABNORMAL LOW (ref 3.87–5.11)
RDW: 15.9 % — ABNORMAL HIGH (ref 11.5–15.5)
WBC: 9.3 10*3/uL (ref 4.0–10.5)
nRBC: 0 % (ref 0.0–0.2)

## 2022-12-24 LAB — FOLATE: Folate: 34.8 ng/mL (ref >5.9)

## 2022-12-24 LAB — GLUCOSE, CAPILLARY
Glucose-Capillary: 61 mg/dL — ABNORMAL LOW (ref 70–99)
Glucose-Capillary: 80 mg/dL (ref 70–99)
Glucose-Capillary: 109 mg/dL — ABNORMAL HIGH (ref 70–99)
Glucose-Capillary: 181 mg/dL — ABNORMAL HIGH (ref 70–99)
Glucose-Capillary: 184 mg/dL — ABNORMAL HIGH (ref 70–99)

## 2022-12-24 LAB — HEPATITIS PANEL, ACUTE
HCV Ab: NONREACTIVE
Hep A IgM: NONREACTIVE
Hep B C IgM: NONREACTIVE
Hepatitis B Surface Ag: NONREACTIVE

## 2022-12-24 LAB — IRON AND TIBC: Iron: 66 ug/dL (ref 28–170)

## 2022-12-24 LAB — RETICULOCYTES
Immature Retic Fract: 12.8 % (ref 2.3–15.9)
RBC.: 2.4 MIL/uL — ABNORMAL LOW (ref 3.87–5.11)
Retic Count, Absolute: 81.6 10*3/uL (ref 19.0–186.0)
Retic Ct Pct: 3.4 % — ABNORMAL HIGH (ref 0.4–3.1)

## 2022-12-24 LAB — BRAIN NATRIURETIC PEPTIDE: B Natriuretic Peptide: 1423.9 pg/mL — ABNORMAL HIGH (ref 0.0–100.0)

## 2022-12-24 LAB — RESP PANEL BY RT-PCR (RSV, FLU A&B, COVID)  RVPGX2
Influenza A by PCR: NEGATIVE
Influenza B by PCR: NEGATIVE
Resp Syncytial Virus by PCR: NEGATIVE
SARS Coronavirus 2 by RT PCR: NEGATIVE

## 2022-12-24 LAB — LACTIC ACID, PLASMA: Lactic Acid, Venous: 2.4 mmol/L (ref 0.5–1.9)

## 2022-12-24 LAB — VITAMIN B12: Vitamin B-12: 821 pg/mL (ref 180–914)

## 2022-12-24 LAB — FERRITIN: Ferritin: 1405 ng/mL — ABNORMAL HIGH (ref 11–307)

## 2022-12-24 LAB — HEPATITIS B SURFACE ANTIGEN: Hepatitis B Surface Ag: NONREACTIVE

## 2022-12-24 MED ORDER — CYCLOSPORINE 0.05 % OP EMUL: 1.0000 [drp] | Freq: Every day | OPHTHALMIC | Status: DC | PRN

## 2022-12-24 MED ORDER — ROPINIROLE HCL 0.25 MG PO TABS
0.2500 mg | ORAL_TABLET | Freq: Every morning | ORAL | Status: DC
  Administered 2022-12-24 – 2023-01-02 (×8): 0.25 mg via ORAL
  Filled 2022-12-24 (×11): qty 1

## 2022-12-24 MED ORDER — INSULIN ASPART 100 UNIT/ML IJ SOLN
0.0000 [IU] | Freq: Three times a day (TID) | INTRAMUSCULAR | Status: DC
  Administered 2022-12-24 – 2023-01-02 (×6): 1 [IU] via SUBCUTANEOUS

## 2022-12-24 MED ORDER — PANCRELIPASE (LIP-PROT-AMYL) 36000-114000 UNITS PO CPEP
72000.0000 [IU] | ORAL_CAPSULE | Freq: Three times a day (TID) | ORAL | Status: DC
  Administered 2022-12-24 – 2023-01-02 (×21): 72000 [IU] via ORAL
  Filled 2022-12-24 (×29): qty 2

## 2022-12-24 MED ORDER — SODIUM CHLORIDE 0.9% FLUSH
3.0000 mL | Freq: Two times a day (BID) | INTRAVENOUS | Status: DC
  Administered 2022-12-24 – 2023-01-02 (×15): 3 mL via INTRAVENOUS

## 2022-12-24 MED ORDER — CHLORHEXIDINE GLUCONATE CLOTH 2 % EX PADS
6.0000 | MEDICATED_PAD | Freq: Every day | CUTANEOUS | Status: DC
  Administered 2022-12-24 – 2022-12-29 (×5): 6 via TOPICAL

## 2022-12-24 MED ORDER — ACETAMINOPHEN 325 MG PO TABS
650.0000 mg | ORAL_TABLET | Freq: Once | ORAL | Status: AC
  Administered 2022-12-24: 650 mg via ORAL
  Filled 2022-12-24: qty 2

## 2022-12-24 MED ORDER — HEPARIN SODIUM (PORCINE) 5000 UNIT/ML IJ SOLN
5000.0000 [IU] | Freq: Three times a day (TID) | INTRAMUSCULAR | Status: DC
  Filled 2022-12-24 (×2): qty 1

## 2022-12-24 MED ORDER — ASPIRIN 81 MG PO CHEW
81.0000 mg | CHEWABLE_TABLET | Freq: Every day | ORAL | Status: DC
  Administered 2022-12-24 – 2023-01-02 (×9): 81 mg via ORAL
  Filled 2022-12-24 (×10): qty 1

## 2022-12-24 MED ORDER — ROPINIROLE HCL 1 MG PO TABS
0.5000 mg | ORAL_TABLET | Freq: Every day | ORAL | Status: DC
Start: 2022-12-24 — End: 2023-01-02
  Administered 2022-12-24 – 2023-01-02 (×9): 0.5 mg via ORAL
  Filled 2022-12-24 (×11): qty 1

## 2022-12-24 MED ORDER — ENSURE ENLIVE PO LIQD
237.0000 mL | Freq: Two times a day (BID) | ORAL | Status: DC
  Administered 2022-12-24 – 2023-01-01 (×10): 237 mL via ORAL

## 2022-12-24 MED ORDER — PANCRELIPASE (LIP-PROT-AMYL) 36000-114000 UNITS PO CPEP: 36000.0000 [IU] | ORAL_CAPSULE | ORAL | Status: DC

## 2022-12-24 MED ORDER — PANCRELIPASE (LIP-PROT-AMYL) 36000-114000 UNITS PO CPEP
36000.0000 [IU] | ORAL_CAPSULE | Freq: Two times a day (BID) | ORAL | Status: DC | PRN
Start: 2022-12-24 — End: 2023-01-02

## 2022-12-24 MED ORDER — ROPINIROLE HCL 0.25 MG PO TABS: 0.2500 mg | ORAL_TABLET | ORAL | Status: DC

## 2022-12-24 MED ORDER — INSULIN ASPART 100 UNIT/ML IJ SOLN
0.0000 [IU] | Freq: Every day | INTRAMUSCULAR | Status: DC
Start: 2022-12-24 — End: 2023-01-02

## 2022-12-24 MED ORDER — SODIUM CHLORIDE 0.9 % IV SOLN
2.0000 g | INTRAVENOUS | Status: AC
  Administered 2022-12-24 – 2022-12-30 (×7): 2 g via INTRAVENOUS
  Filled 2022-12-24 (×7): qty 20

## 2022-12-24 MED ORDER — AMIODARONE HCL 200 MG PO TABS
200.0000 mg | ORAL_TABLET | Freq: Every day | ORAL | Status: DC
Start: 2022-12-24 — End: 2022-12-27
  Administered 2022-12-24 – 2022-12-27 (×4): 200 mg via ORAL
  Filled 2022-12-24 (×4): qty 1

## 2022-12-24 MED ORDER — SEVELAMER CARBONATE 800 MG PO TABS
800.0000 mg | ORAL_TABLET | Freq: Three times a day (TID) | ORAL | Status: DC
  Administered 2022-12-24 – 2023-01-02 (×21): 800 mg via ORAL
  Filled 2022-12-24 (×25): qty 1

## 2022-12-24 MED ORDER — ALBUMIN HUMAN 25 % IV SOLN
25.0000 g | Freq: Once | INTRAVENOUS | Status: AC
Start: 2022-12-25 — End: 2022-12-25
  Administered 2022-12-25: 25 g via INTRAVENOUS
  Filled 2022-12-24: qty 100

## 2022-12-24 MED ORDER — DOXERCALCIFEROL 4 MCG/2ML IV SOLN
6.0000 ug | INTRAVENOUS | Status: DC
  Administered 2022-12-25 – 2022-12-27 (×2): 6 ug via INTRAVENOUS
  Filled 2022-12-24 (×3): qty 4

## 2022-12-24 NOTE — H&P (Signed)
History and Physical    [name]:[id number] DOB: 02-14-1936 DOA: 12/23/2022  PCP: Lewis Moccasin, MD   Patient coming from: Home   Chief Complaint: Leg swelling, redness   HPI: Kim Burns is a pleasant 87 y.o. female with medical history significant for IPMN of pancreas status post Whipple procedure, insulin-dependent diabetes mellitus, RCC status post ablation, ESRD on hemodialysis, PAF not anticoagulated, HFmrEF, history of CVA, and OSA with CPAP intolerance who presents for evaluation of worsening lower extremity swelling and redness.  Patient reports chronic lower extremity edema but this has worsened significantly over the past couple weeks.  The lower left leg has become erythematous, hot, tender, and blistered recently.   Patient reports that she has frequently been hypotensive with dialysis for the past month or so, has been taking midodrine prior to dialysis and this is sometimes redosed during dialysis.  Some of her sessions have had to be cut short due to this and she suspects that this is the reason for her worsening edema.  She also recently learned that her albumin has been low and she was advised to increase her dietary protein.  She denies fevers or chills.  She denies chest pain or hemoptysis.  ED Course: Upon arrival to the ED, patient is found to be afebrile and saturating mid 90s on room air with normal heart rate and stable blood pressure.  Labs are most notable for normal potassium, normal bicarbonate, normal BUN, albumin 1.9, AST 63, ALT 104, hemoglobin 8.6, and lactate 3.3.  Chest x-ray is negative for acute findings.  Blood cultures were collected in the ED and the patient was given 500 mL of normal saline, vancomycin, and cefepime.  Review of Systems:  All other systems reviewed and apart from HPI, are negative.  Past Medical History:  Diagnosis Date   Anemia of chronic disease    takes iron   Anxiety    Blood transfusion without reported  diagnosis    Bradycardia    Diabetes mellitus without complication (HCC)    became diabetic after Whipple procedure   Diarrhea    Dysrhythmia 04/16/2016   bradycardia due to medication    ESRD on hemodialysis [facility])    M-W-F   GERD (gastroesophageal reflux disease)    Gout    Headache    History of kidney stones 06/2013   Hyperparathyroidism (HCC)    Hypertension    PONV (postoperative nausea and vomiting)    Sleep apnea    no cpap machine. could not tolerate   Stroke (HCC) 04/16/2016   TIA 1995   Vitamin D deficiency     Past Surgical History:  Procedure Laterality Date   A/V FISTULAGRAM Left 08/30/2021   Procedure: A/V Fistulagram;  Surgeon: Cephus Shelling, MD;  Location: [facility] INVASIVE CV LAB;  Service: Cardiovascular;  Laterality: Left;   ABDOMINAL HYSTERECTOMY  1985   complete   BACK SURGERY  1980   lower   BASCILIC VEIN TRANSPOSITION Left 04/24/2017   Procedure: LEFT ARM FIRST STAGE BASILIC VEIN TRANSPOSITION;  Surgeon: Nada Libman, MD;  Location: [facility] OR;  Service: Vascular;  Laterality: Left;   BASCILIC VEIN TRANSPOSITION Left 07/10/2017   Procedure: SECOND STAGE BASILIC VEIN TRANSPOSITION LEFT ARM;  Surgeon: Nada Libman, MD;  Location: [facility] OR;  Service: Vascular;  Laterality: Left;   BREAST LUMPECTOMY Left x 2   many years apart, benign   CHOLECYSTECTOMY  1985   CYSTOSCOPY W/ URETERAL STENT PLACEMENT Left 10/31/2021  Procedure: CYSTOSCOPY WITH RETROGRADE PYELOGRAM/URETERAL STENT PLACEMENT;  Surgeon: Crist Fat, MD;  Location: [facility] OR;  Service: Urology;  Laterality: Left;   CYSTOSCOPY WITH URETEROSCOPY AND STENT PLACEMENT Left 06/18/2013   Procedure: CYSTOSCOPY WITH Lef URETEROSCOPY AND Left STENT PLACEMENT;  Surgeon: Crecencio Mc, MD;  Location: [facility] ORS;  Service: Urology;  Laterality: Left;   ESOPHAGOGASTRODUODENOSCOPY (EGD) WITH PROPOFOL N/A 11/13/2016   Procedure: ESOPHAGOGASTRODUODENOSCOPY (EGD) WITH PROPOFOL;  Surgeon: Kathi Der, MD;   Location: [facility] ENDOSCOPY;  Service: Gastroenterology;  Laterality: N/A;   EUS N/A 02/07/2016   Procedure: ESOPHAGEAL ENDOSCOPIC ULTRASOUND (EUS) RADIAL;  Surgeon: Willis Modena, MD;  Location: [facility] ENDOSCOPY;  Service: Endoscopy;  Laterality: N/A;   EYE SURGERY Bilateral 2014   ioc for catracts    FOOT SURGERY Left 1990   something with toes unsure what    HERNIA REPAIR  2018   IR CATHETER TUBE CHANGE  03/14/2022   IR EMBO TUMOR ORGAN ISCHEMIA INFARCT INC GUIDE ROADMAPPING  08/22/2021   IR NEPHROSTOMY EXCHANGE LEFT  06/27/2022   IR NEPHROSTOMY EXCHANGE LEFT  08/26/2022   IR NEPHROSTOMY EXCHANGE LEFT  10/22/2022   IR NEPHROSTOMY EXCHANGE LEFT  12/16/2022   IR NEPHROSTOMY PLACEMENT LEFT  03/14/2022   IR RADIOLOGIST EVAL & MGMT  05/18/2021   IR RADIOLOGIST EVAL & MGMT  10/01/2021   IR RADIOLOGIST EVAL & MGMT  11/12/2021   IR RADIOLOGIST EVAL & MGMT  11/22/2021   IR RADIOLOGIST EVAL & MGMT  11/27/2021   IR RADIOLOGIST EVAL & MGMT  12/20/2021   IR RENAL SUPRASEL UNI S&I MOD SED  08/22/2021   IR US GUIDE VASC ACCESS RIGHT  08/22/2021   LEFT HEART CATH AND CORONARY ANGIOGRAPHY N/A 08/27/2021   Procedure: LEFT HEART CATH AND CORONARY ANGIOGRAPHY;  Surgeon: Kathleene Hazel, MD;  Location: [facility] INVASIVE CV LAB;  Service: Cardiovascular;  Laterality: N/A;   PARATHYROID EXPLORATION     PERIPHERAL VASCULAR BALLOON ANGIOPLASTY Left 08/30/2021   Procedure: PERIPHERAL VASCULAR BALLOON ANGIOPLASTY;  Surgeon: Cephus Shelling, MD;  Location: [facility] INVASIVE CV LAB;  Service: Cardiovascular;  Laterality: Left;  arm fistula   RADIOLOGY WITH ANESTHESIA Left 08/22/2021   Procedure: MICROWAVE ABLATION;  Surgeon: Simonne Come, MD;  Location: [facility] ORS;  Service: Anesthesiology;  Laterality: Left;   WHIPPLE PROCEDURE N/A 04/23/2016   Procedure: WHIPPLE PROCEDURE;  Surgeon: Almond Lint, MD;  Location: [facility] OR;  Service: General;  Laterality: N/A;    Social History:   reports that she has never smoked. She has never used smokeless  tobacco. She reports that she does not drink alcohol and does not use drugs.  Allergies  Allergen Reactions   Lopid [Gemfibrozil] Other (See Comments)    Myalgia    Lotemax [Loteprednol Etabonate] Rash    Skin burning  Blurry vision   Statins Other (See Comments)    Rosuvastatin Muscle weakness   Norco [Hydrocodone-Acetaminophen] Other (See Comments)    Headaches  Tolerates acetaminophen    Other Diarrhea    Real Butter - diarrhea   Vibra-Tab [Doxycycline] Other (See Comments)    Unknown reaction   Amlodipine Other (See Comments)    Norvasc   Codeine Other (See Comments)    headache   Hydrocodone Other (See Comments)   Hydrocodone-Acetaminophen Other (See Comments)    Headache. Tolerates acetaminophen.   Lisinopril Other (See Comments)   Verapamil Other (See Comments)    Family History  Problem Relation Age of Onset   Heart disease Mother  Stroke Mother    Cancer Father        colon   Alzheimer's disease Sister    Alzheimer's disease Sister    Cancer Brother        brain   Breast cancer Neg Hx      Prior to Admission medications   Medication Sig Start Date End Date Taking? Authorizing Provider  acetaminophen (TYLENOL) 500 MG tablet Take 1 tablet (500 mg total) by mouth 3 (three) times daily. 11/13/22   Love, Evlyn Kanner, PA-C  amantadine (SYMMETREL) 100 MG capsule Take 1 capsule (100 mg total) by mouth once a week. 11/20/22   Love, Evlyn Kanner, PA-C  amiodarone (PACERONE) 200 MG tablet Take 1 tablet (200 mg total) by mouth daily. 11/15/22   Love, Evlyn Kanner, PA-C  aspirin 81 MG chewable tablet Chew 1 tablet (81 mg total) by mouth daily. 11/13/22   Love, Evlyn Kanner, PA-C  B Complex-C-Zn-Folic Acid (DIALYVITE 800 WITH ZINC) 0.8 MG TABS Take 1 tablet by mouth daily.    [provider]  benzonatate (TESSALON) 100 MG capsule Take 1 capsule (100 mg total) by mouth 3 (three) times daily as needed for cough. Patient not taking: Reported on 12/16/2022 11/13/22   Love, Evlyn Kanner,  PA-C  cycloSPORINE (RESTASIS) 0.05 % ophthalmic emulsion Place 1 drop into both eyes daily as needed (irritation). 11/13/22   Love, Evlyn Kanner, PA-C  guaiFENesin (MUCINEX) 600 MG 12 hr tablet Take 2 tablets (1,200 mg total) by mouth 2 (two) times daily. Patient not taking: Reported on 12/16/2022 11/13/22   Love, Evlyn Kanner, PA-C  lidocaine (LIDODERM) 5 % Place 1 patch onto the skin daily. Remove & Discard patch within 12 hours or as directed by MD Patient not taking: Reported on 12/16/2022 11/14/22   Jacquelynn Cree, PA-C  lidocaine-prilocaine (EMLA) cream Apply 1 Application topically every Monday, Wednesday, and Friday with hemodialysis.    [provider]  lipase/protease/amylase (CREON) 36000 UNITS CPEP capsule Take 36,000-72,000 Units by mouth See admin instructions. Take 72,000 units (2 capsules) by mouth with meals and 36,000 units (1 capsule) with snacks.    [provider]  meclizine (ANTIVERT) 12.5 MG tablet Take 1 tablet (12.5 mg total) by mouth 3 (three) times daily as needed for dizziness. Patient not taking: Reported on 12/16/2022 10/30/22   Miguel Rota, MD  metoprolol succinate (TOPROL-XL) 25 MG 24 hr tablet Take 1 tablet (25 mg total) by mouth daily. Patient taking differently: Take 25 mg by mouth daily. Metoprolol if systolic BP (top number) is less than 110. 11/29/22   Jacalyn Lefevre L, NP  olopatadine (PATADAY) 0.1 % ophthalmic solution Place 1 drop into both eyes 2 (two) times daily as needed for allergies. Patient not taking: Reported on 12/16/2022    [provider]  rOPINIRole (REQUIP) 0.25 MG tablet Take 1 tablet (0.25 mg total) by mouth 2 (two) times daily. Patient taking differently: Take 0.25-0.5 mg by mouth See admin instructions. Take 0.5mg  (1 tablet) by mouth every night at bedtime and 0.25mg  (1/2 tablet) in the mornings as needed. 09/24/22   Rodolph Bong, MD  sevelamer carbonate (RENVELA) 800 MG tablet Take 1 tablet (800 mg total) by mouth 3  (three) times daily with meals. 11/13/22   Love, Evlyn Kanner, PA-C  valACYclovir (VALTREX) 500 MG tablet Take 1 tablet (500 mg total) by mouth at bedtime. Patient not taking: Reported on 12/16/2022 09/24/22   Rodolph Bong, MD    Physical Exam: Vitals:  12/23/22 2300 12/23/22 2330 12/24/22 0145 12/24/22 0215  BP: (!) 161/77 (!) 171/68 (!) 158/76 (!) 177/68  Pulse: 64 65 71 66  Resp: 15 19 16 15   Temp:    97.7 F (36.5 C)  TempSrc:      SpO2: 100% 100% 99% 99%     Constitutional: NAD, calm  Eyes: PERTLA, lids and conjunctivae normal ENMT: Mucous membranes are moist. Posterior pharynx clear of any exudate or lesions.   Neck: supple, no masses  Respiratory: clear to auscultation bilaterally, no wheezing, no crackles. No accessory muscle use.  Cardiovascular: S1 & S2 heard, regular rate and rhythm. No extremity edema. No significant JVD. Abdomen: No distension, no tenderness, soft. Bowel sounds active.  Musculoskeletal: no clubbing / cyanosis. No joint deformity upper and lower extremities.   Skin: no significant rashes, lesions, ulcers. Warm, dry, well-perfused. Neurologic: CN 2-12 grossly intact. Sensation intact, DTR normal. Strength 5/5 in all 4 limbs. Alert and oriented.  Psychiatric: Pleasant. Cooperative.    Labs and Imaging on Admission: I have personally reviewed following labs and imaging studies  CBC: Recent Labs  Lab 12/23/22 1916  WBC 9.1  NEUTROABS 7.5  HGB 8.6*  HCT 28.0*  MCV 104.1*  PLT 178   Basic Metabolic Panel: Recent Labs  Lab 12/23/22 1916  NA 141  K 4.0  CL 100  CO2 29  GLUCOSE 174*  BUN 17  CREATININE 2.99*  CALCIUM 7.5*   GFR: Estimated Creatinine Clearance: 11.4 mL/min (A) (by C-G formula based on SCr of 2.99 mg/dL (H)). Liver Function Tests: Recent Labs  Lab 12/23/22 1916  AST 63*  ALT 104*  ALKPHOS 128*  BILITOT 0.6  PROT 4.6*  ALBUMIN 1.9*   No results for input(s): "LIPASE", "AMYLASE" in the last 168 hours. No results  for input(s): "AMMONIA" in the last 168 hours. Coagulation Profile: No results for input(s): "INR", "PROTIME" in the last 168 hours. Cardiac Enzymes: No results for input(s): "CKTOTAL", "CKMB", "CKMBINDEX", "TROPONINI" in the last 168 hours. BNP (last 3 results) No results for input(s): "PROBNP" in the last 8760 hours. HbA1C: No results for input(s): "HGBA1C" in the last 72 hours. CBG: Recent Labs  Lab 12/23/22 1932  GLUCAP 128*   Lipid Profile: No results for input(s): "CHOL", "HDL", "LDLCALC", "TRIG", "CHOLHDL", "LDLDIRECT" in the last 72 hours. Thyroid Function Tests: No results for input(s): "TSH", "T4TOTAL", "FREET4", "T3FREE", "THYROIDAB" in the last 72 hours. Anemia Panel: No results for input(s): "VITAMINB12", "FOLATE", "FERRITIN", "TIBC", "IRON", "RETICCTPCT" in the last 72 hours. Urine analysis:    Component Value Date/Time   COLORURINE YELLOW 12/23/2022 1930   APPEARANCEUR HAZY (A) 12/23/2022 1930   LABSPEC 1.012 12/23/2022 1930   PHURINE 7.0 12/23/2022 1930   GLUCOSEU NEGATIVE 12/23/2022 1930   HGBUR NEGATIVE 12/23/2022 1930   BILIRUBINUR NEGATIVE 12/23/2022 1930   KETONESUR NEGATIVE 12/23/2022 1930   PROTEINUR 100 (A) 12/23/2022 1930   UROBILINOGEN 1.0 06/18/2013 1633   NITRITE NEGATIVE 12/23/2022 1930   LEUKOCYTESUR TRACE (A) 12/23/2022 1930   Sepsis Labs: @LABRCNTIP (procalcitonin:4,lacticidven:4) ) Recent Results (from the past 240 hour(s))  Resp panel by RT-PCR (RSV, Flu A&B, Covid) Peripheral     Status: None   Collection Time: 12/23/22 11:31 PM   Specimen: Peripheral; Nasal Swab  Result Value Ref Range Status   SARS Coronavirus 2 by RT PCR NEGATIVE NEGATIVE Final   Influenza A by PCR NEGATIVE NEGATIVE Final   Influenza B by PCR NEGATIVE NEGATIVE Final    Comment: (NOTE) The Xpert  Xpress SARS-CoV-2/FLU/RSV plus assay is intended as an aid in the diagnosis of influenza from Nasopharyngeal swab specimens and should not be used as a sole basis for  treatment. Nasal washings and aspirates are unacceptable for Xpert Xpress SARS-CoV-2/FLU/RSV testing.  Fact Sheet for Patients: BloggerCourse.com  Fact Sheet for Healthcare Providers: SeriousBroker.it  This test is not yet approved or cleared by the [place] FDA and has been authorized for detection and/or diagnosis of SARS-CoV-2 by FDA under an Emergency Use Authorization (EUA). This EUA will remain in effect (meaning this test can be used) for the duration of the COVID-19 declaration under Section 564(b)(1) of the Act, 21 U.S.C. section 360bbb-3(b)(1), unless the authorization is terminated or revoked.     Resp Syncytial Virus by PCR NEGATIVE NEGATIVE Final    Comment: (NOTE) Fact Sheet for Patients: BloggerCourse.com  Fact Sheet for Healthcare Providers: SeriousBroker.it  This test is not yet approved or cleared by the [place] FDA and has been authorized for detection and/or diagnosis of SARS-CoV-2 by FDA under an Emergency Use Authorization (EUA). This EUA will remain in effect (meaning this test can be used) for the duration of the COVID-19 declaration under Section 564(b)(1) of the Act, 21 U.S.C. section 360bbb-3(b)(1), unless the authorization is terminated or revoked.  Performed at [facility], [address], [place], [place] [address]      Radiological Exams on Admission: DG Chest 2 View  Result Date: 12/24/2022 CLINICAL DATA:  Lactic acidosis EXAM: CHEST - 2 VIEW COMPARISON:  10/21/2022 FINDINGS: Lungs are well expanded, symmetric, and clear. No pneumothorax or pleural effusion. Cardiac size within normal limits. Pulmonary vascularity is normal. Osseous structures are age-appropriate. No acute bone abnormality. IMPRESSION: No active cardiopulmonary disease. Electronically Signed   By: Helyn Numbers M.D.   On: 12/24/2022 00:05     Assessment/Plan    1. Left lower leg cellulitis  - Not septic on admission, no purulence or crepitus  - Treat with Rocephin, follow cultures and clinical course, consider advanced imaging if worsens or fails to improve    2. ESRD  - Last dialyzed 12/23/22  - Restrict fluids, renally-dose medications, continue sevelamer, consult nephrology for inpatient dialysis    3. PAF  - Continue amiodarone, hold metoprolol for now given report of frequent hypotension   4. Diabetes mellitus  - A1c was 6.1% in September 2024  - Check CBGs and use low-intensity SSI   5. Elevated LFTs  - Alk phos slightly elevated, AST 63, and ALT 104 with normal bilirubin  - Abdominal exam benign - Check RUQ [place] and repeat LFTs in am    6. Hx of CVA  - Continue ASA   7. HFmrEF  - Restrict fluids, monitor weight and I/Os, volume managed with dialysis   8. Malnutrition  - Consult dietary    DVT prophylaxis: sq heparin  Code Status: Full  Level of Care: Level of care: Telemetry Medical Family Communication: Son at bedside   Disposition Plan:  Patient is from: Home  Anticipated d/c is to: TBD Anticipated d/c date is: 12/26/22  Patient currently: Pending improvement in LLE cellulitis, stable H&H  Consults called: None  Admission status: Inpatient     Briscoe Deutscher, MD [facility]  12/24/2022, 3:39 AM

## 2022-12-24 NOTE — ED Notes (Signed)
ED TO INPATIENT HANDOFF REPORT  ED Nurse Name and Phone #: Sheilah Mins [phone]  S Name/Age/Gender Kim Burns 87 y.o. female Room/Bed: 003C/003C  Code Status   Code Status: Full Code  Home/SNF/Other Home Patient oriented to: self, place, time, and situation Is this baseline? Yes   Triage Complete: Triage complete  Chief Complaint Cellulitis of left lower extremity [L03.116]  Triage Note Pt reports bilateral leg swelling and blisters on her legs. Pt has red swollen blisters on bilateral lower extremities. Pt denies fevers.   Allergies Allergies  Allergen Reactions   Lopid [Gemfibrozil] Other (See Comments)    Myalgia    Lotemax [Loteprednol Etabonate] Rash    Skin burning  Blurry vision   Statins Other (See Comments)    Rosuvastatin Muscle weakness   Norco [Hydrocodone-Acetaminophen] Other (See Comments)    Headaches  Tolerates acetaminophen    Other Diarrhea    Real Butter - diarrhea   Vibra-Tab [Doxycycline] Other (See Comments)    Unknown reaction   Amlodipine Other (See Comments)    Norvasc   Codeine Other (See Comments)    headache   Hydrocodone Other (See Comments)   Hydrocodone-Acetaminophen Other (See Comments)    Headache. Tolerates acetaminophen.   Lisinopril Other (See Comments)   Verapamil Other (See Comments)    Level of Care/Admitting Diagnosis ED Disposition     ED Disposition  Admit   Condition  --   Comment  Hospital Area: [facility] [[id number]]  Level of Care: Telemetry Medical [104]  May admit patient to [facility] or [facility] if equivalent level of care is available:: No  Covid Evaluation: Asymptomatic - no recent exposure (last 10 days) testing not required  Diagnosis: Cellulitis of left lower extremity [846962]  Admitting Physician: Briscoe Deutscher [[id number]]  Attending Physician: Briscoe Deutscher [[id number]]  Certification:: I certify this patient will need inpatient services for at least 2 midnights   Expected Medical Readiness: 12/27/2022          B Medical/Surgery History Past Medical History:  Diagnosis Date   Anemia of chronic disease    takes iron   Anxiety    Blood transfusion without reported diagnosis    Bradycardia    Diabetes mellitus without complication (HCC)    became diabetic after Whipple procedure   Diarrhea    Dysrhythmia 04/16/2016   bradycardia due to medication    ESRD on hemodialysis [facility])    M-W-F   GERD (gastroesophageal reflux disease)    Gout    Headache    History of kidney stones 06/2013   Hyperparathyroidism (HCC)    Hypertension    PONV (postoperative nausea and vomiting)    Sleep apnea    no cpap machine. could not tolerate   Stroke (HCC) 04/16/2016   TIA 1995   Vitamin D deficiency    Past Surgical History:  Procedure Laterality Date   A/V FISTULAGRAM Left 08/30/2021   Procedure: A/V Fistulagram;  Surgeon: Cephus Shelling, MD;  Location: [facility] INVASIVE CV LAB;  Service: Cardiovascular;  Laterality: Left;   ABDOMINAL HYSTERECTOMY  1985   complete   BACK SURGERY  1980   lower   BASCILIC VEIN TRANSPOSITION Left 04/24/2017   Procedure: LEFT ARM FIRST STAGE BASILIC VEIN TRANSPOSITION;  Surgeon: Nada Libman, MD;  Location: [facility] OR;  Service: Vascular;  Laterality: Left;   BASCILIC VEIN TRANSPOSITION Left 07/10/2017   Procedure: SECOND STAGE BASILIC VEIN TRANSPOSITION LEFT ARM;  Surgeon: Nada Libman,  MD;  Location: [facility] OR;  Service: Vascular;  Laterality: Left;   BREAST LUMPECTOMY Left x 2   many years apart, benign   CHOLECYSTECTOMY  1985   CYSTOSCOPY W/ URETERAL STENT PLACEMENT Left 10/31/2021   Procedure: CYSTOSCOPY WITH RETROGRADE PYELOGRAM/URETERAL STENT PLACEMENT;  Surgeon: Crist Fat, MD;  Location[facility] OR;  Service: Urology;  Laterality: Left;   CYSTOSCOPY WITH URETEROSCOPY AND STENT PLACEMENT Left 06/18/2013   Procedure: CYSTOSCOPY WITH Lef URETEROSCOPY AND Left STENT PLACEMENT;  Surgeon: Crecencio Mc, MD;   Location: [facility] ORS;  Service: Urology;  Laterality: Left;   ESOPHAGOGASTRODUODENOSCOPY (EGD) WITH PROPOFOL N/A 11/13/2016   Procedure: ESOPHAGOGASTRODUODENOSCOPY (EGD) WITH PROPOFOL;  Surgeon: Kathi Der, MD;  Location: [facility] ENDOSCOPY;  Service: Gastroenterology;  Laterality: N/A;   EUS N/A 02/07/2016   Procedure: ESOPHAGEAL ENDOSCOPIC ULTRASOUND (EUS) RADIAL;  Surgeon: Willis Modena, MD;  Location: [facility] ENDOSCOPY;  Service: Endoscopy;  Laterality: N/A;   EYE SURGERY Bilateral 2014   ioc for catracts    FOOT SURGERY Left 1990   something with toes unsure what    HERNIA REPAIR  2018   IR CATHETER TUBE CHANGE  03/14/2022   IR EMBO TUMOR ORGAN ISCHEMIA INFARCT INC GUIDE ROADMAPPING  08/22/2021   IR NEPHROSTOMY EXCHANGE LEFT  06/27/2022   IR NEPHROSTOMY EXCHANGE LEFT  08/26/2022   IR NEPHROSTOMY EXCHANGE LEFT  10/22/2022   IR NEPHROSTOMY EXCHANGE LEFT  12/16/2022   IR NEPHROSTOMY PLACEMENT LEFT  03/14/2022   IR RADIOLOGIST EVAL & MGMT  05/18/2021   IR RADIOLOGIST EVAL & MGMT  10/01/2021   IR RADIOLOGIST EVAL & MGMT  11/12/2021   IR RADIOLOGIST EVAL & MGMT  11/22/2021   IR RADIOLOGIST EVAL & MGMT  11/27/2021   IR RADIOLOGIST EVAL & MGMT  12/20/2021   IR RENAL SUPRASEL UNI S&I MOD SED  08/22/2021   IR US GUIDE VASC ACCESS RIGHT  08/22/2021   LEFT HEART CATH AND CORONARY ANGIOGRAPHY N/A 08/27/2021   Procedure: LEFT HEART CATH AND CORONARY ANGIOGRAPHY;  Surgeon: Kathleene Hazel, MD;  [facility] INVASIVE CV LAB;  Service: Cardiovascular;  Laterality: N/A;   PARATHYROID EXPLORATION     PERIPHERAL VASCULAR BALLOON ANGIOPLASTY Left 08/30/2021   Procedure: PERIPHERAL VASCULAR BALLOON ANGIOPLASTY;  Surgeon: Cephus Shelling, MD;  [facility] INVASIVE CV LAB;  Service: Cardiovascular;  Laterality: Left;  arm fistula   RADIOLOGY WITH ANESTHESIA Left 08/22/2021   Procedure: MICROWAVE ABLATION;  Surgeon: Simonne Come, MD;  Location: [facility] ORS;  Service: Anesthesiology;  Laterality: Left;   WHIPPLE PROCEDURE N/A  04/23/2016   Procedure: WHIPPLE PROCEDURE;  Surgeon: Almond Lint, MD;  Location: [facility] OR;  Service: General;  Laterality: N/A;     A IV Location/Drains/Wounds Patient Lines/Drains/Airways Status     Active Line/Drains/Airways     Name Placement date Placement time Site Days   Peripheral IV 12/23/22 20 G 1" Anterior;Proximal;Right Forearm 12/23/22  2246  Forearm  1   Fistula / Graft Left Upper arm Arteriovenous fistula 04/24/17  1031  Upper arm  2070   Nephrostomy Left 10.2 Fr. 12/16/22  1241  Left  8   Pressure Injury 10/21/22 Buttocks Right Deep Tissue Pressure Injury - Purple or maroon localized area of discolored intact skin or blood-filled blister due to damage of underlying soft tissue from pressure and/or shear. 2 areas of deep tissue pressure in 10/21/22  1145  -- 64   Wound / Incision (Open or Dehisced) 10/21/22 Skin tear Arm Left;Lower;Posterior;Proximal 10/21/22  1230  Arm  64            Intake/Output Last 24 hours  Intake/Output Summary (Last 24 hours) at 12/24/2022 0720 Last data filed at 12/24/2022 0224 Gross per 24 hour  Intake 602.47 ml  Output --  Net 602.47 ml    Labs/Imaging Results for orders placed or performed during the hospital encounter of 12/23/22 (from the past 48 hour(s))  Comprehensive metabolic panel     Status: Abnormal   Collection Time: 12/23/22  7:16 PM  Result Value Ref Range   Sodium 141 135 - 145 mmol/L   Potassium 4.0 3.5 - 5.1 mmol/L   Chloride 100 98 - 111 mmol/L   CO2 29 22 - 32 mmol/L   Glucose, Bld 174 (H) 70 - 99 mg/dL    Comment: Glucose reference range applies only to samples taken after fasting for at least 8 hours.   BUN 17 8 - 23 mg/dL   Creatinine, Ser 8.25 (H) 0.44 - 1.00 mg/dL   Calcium 7.5 (L) 8.9 - 10.3 mg/dL   Total Protein 4.6 (L) 6.5 - 8.1 g/dL   Albumin 1.9 (L) 3.5 - 5.0 g/dL   AST 63 (H) 15 - 41 U/L   ALT 104 (H) 0 - 44 U/L   Alkaline Phosphatase 128 (H) 38 - 126 U/L   Total Bilirubin 0.6 <1.2 mg/dL   GFR,  Estimated 15 (L) >60 mL/min    Comment: (NOTE) Calculated using the CKD-EPI Creatinine Equation (2021)    Anion gap 12 5 - 15    Comment: Performed at [facility], [address] [place], [place] [address]  CBC with Differential     Status: Abnormal   Collection Time: 12/23/22  7:16 PM  Result Value Ref Range   WBC 9.1 4.0 - 10.5 K/uL   RBC 2.69 (L) 3.87 - 5.11 MIL/uL   Hemoglobin 8.6 (L) 12.0 - 15.0 g/dL   HCT 67.3 (L) 41.9 - 37.9 %   MCV 104.1 (H) 80.0 - 100.0 fL   MCH 32.0 26.0 - 34.0 pg   MCHC 30.7 30.0 - 36.0 g/dL   RDW 02.4 (H) 09.7 - 35.3 %   Platelets 178 150 - 400 K/uL   nRBC 0.0 0.0 - 0.2 %   Neutrophils Relative % 83 %   Neutro Abs 7.5 1.7 - 7.7 K/uL   Lymphocytes Relative 11 %   Lymphs Abs 1.0 0.7 - 4.0 K/uL   Monocytes Relative 5 %   Monocytes Absolute 0.5 0.1 - 1.0 K/uL   Eosinophils Relative 1 %   Eosinophils Absolute 0.1 0.0 - 0.5 K/uL   Basophils Relative 0 %   Basophils Absolute 0.0 0.0 - 0.1 K/uL   Immature Granulocytes 0 %   Abs Immature Granulocytes 0.04 0.00 - 0.07 K/uL    Comment: Performed at [facility], [address], [place], [place] [address]  Lactic acid, plasma     Status: Abnormal   Collection Time: 12/23/22  7:16 PM  Result Value Ref Range   Lactic Acid, Venous 3.3 (HH) 0.5 - 1.9 mmol/L    Comment: CRITICAL RESULT CALLED TO, READ BACK BY AND VERIFIED WITH Rhona Raider, RN AT 2011 11.04.24 D. BLU Performed at [facility], [address] [place], [place] [address]   Urinalysis, Routine w reflex microscopic -Urine, Clean Catch     Status: Abnormal   Collection Time: 12/23/22  7:30 PM  Result Value Ref Range   Color, Urine YELLOW YELLOW  APPearance HAZY (A) CLEAR   Specific Gravity, Urine 1.012 1.005 - 1.030   pH 7.0 5.0 - 8.0   Glucose, UA NEGATIVE NEGATIVE mg/dL   Hgb urine dipstick NEGATIVE NEGATIVE   Bilirubin Urine NEGATIVE NEGATIVE   Ketones, ur NEGATIVE NEGATIVE mg/dL   Protein, ur 914 (A) NEGATIVE mg/dL    Nitrite NEGATIVE NEGATIVE   Leukocytes,Ua TRACE (A) NEGATIVE   RBC / HPF 0-5 0 - 5 RBC/hpf   WBC, UA 11-20 0 - 5 WBC/hpf   Bacteria, UA RARE (A) NONE SEEN   Squamous Epithelial / HPF 6-10 0 - 5 /HPF   Mucus PRESENT     Comment: Performed at [facility], [address] [place], [place] [address]  CBG monitoring, ED     Status: Abnormal   Collection Time: 12/23/22  7:32 PM  Result Value Ref Range   Glucose-Capillary 128 (H) 70 - 99 mg/dL    Comment: Glucose reference range applies only to samples taken after fasting for at least 8 hours.  Lactic acid, plasma     Status: Abnormal   Collection Time: 12/23/22 10:49 PM  Result Value Ref Range   Lactic Acid, Venous 3.0 (HH) 0.5 - 1.9 mmol/L    Comment: CRITICAL RESULT CALLED TO, READ BACK BY AND VERIFIED WITH Marcene Corning, RN AT 2328 11.04.24 JLASIGAN Performed at [facility], [address], [place], [place] [address]   Resp panel by RT-PCR (RSV, Flu A&B, Covid) Peripheral     Status: None   Collection Time: 12/23/22 11:31 PM   Specimen: Peripheral; Nasal Swab  Result Value Ref Range   SARS Coronavirus 2 by RT PCR NEGATIVE NEGATIVE   Influenza A by PCR NEGATIVE NEGATIVE   Influenza B by PCR NEGATIVE NEGATIVE    Comment: (NOTE) The Xpert Xpress SARS-CoV-2/FLU/RSV plus assay is intended as an aid in the diagnosis of influenza from Nasopharyngeal swab specimens and should not be used as a sole basis for treatment. Nasal washings and aspirates are unacceptable for Xpert Xpress SARS-CoV-2/FLU/RSV testing.  Fact Sheet for Patients: BloggerCourse.com  Fact Sheet for Healthcare Providers: SeriousBroker.it  This test is not yet approved or cleared by the [place] FDA and has been authorized for detection and/or diagnosis of SARS-CoV-2 by FDA under an Emergency Use Authorization (EUA). This EUA will remain in effect (meaning this test can be used) for the duration of  the COVID-19 declaration under Section 564(b)(1) of the Act, 21 U.S.C. section 360bbb-3(b)(1), unless the authorization is terminated or revoked.     Resp Syncytial Virus by PCR NEGATIVE NEGATIVE    Comment: (NOTE) Fact Sheet for Patients: BloggerCourse.com  Fact Sheet for Healthcare Providers: SeriousBroker.it  This test is not yet approved or cleared by the [place] FDA and has been authorized for detection and/or diagnosis of SARS-CoV-2 by FDA under an Emergency Use Authorization (EUA). This EUA will remain in effect (meaning this test can be used) for the duration of the COVID-19 declaration under Section 564(b)(1) of the Act, 21 U.S.C. section 360bbb-3(b)(1), unless the authorization is terminated or revoked.  Performed at [facility], [address], [place], [place] [address]   Brain natriuretic peptide     Status: Abnormal   Collection Time: 12/23/22 11:31 PM  Result Value Ref Range   B Natriuretic Peptide 1,423.9 (H) 0.0 - 100.0 pg/mL    Comment: Performed at [facility], [address], [place], [place] [address]  Comprehensive metabolic panel  Status: Abnormal   Collection Time: 12/24/22  4:45 AM  Result Value Ref Range   Sodium 142 135 - 145 mmol/L   Potassium 3.8 3.5 - 5.1 mmol/L   Chloride 103 98 - 111 mmol/L   CO2 27 22 - 32 mmol/L   Glucose, Bld 94 70 - 99 mg/dL    Comment: Glucose reference range applies only to samples taken after fasting for at least 8 hours.   BUN 21 8 - 23 mg/dL   Creatinine, Ser 4.09 (H) 0.44 - 1.00 mg/dL   Calcium 7.3 (L) 8.9 - 10.3 mg/dL   Total Protein 4.2 (L) 6.5 - 8.1 g/dL   Albumin 1.7 (L) 3.5 - 5.0 g/dL   AST 52 (H) 15 - 41 U/L   ALT 90 (H) 0 - 44 U/L   Alkaline Phosphatase 112 38 - 126 U/L   Total Bilirubin 0.6 <1.2 mg/dL   GFR, Estimated 14 (L) >60 mL/min    Comment: (NOTE) Calculated using the CKD-EPI Creatinine Equation (2021)    Anion gap 12 5 -  15    Comment: Performed at [facility], [address] [place], [place] [address]  CBC     Status: Abnormal   Collection Time: 12/24/22  4:45 AM  Result Value Ref Range   WBC 9.3 4.0 - 10.5 K/uL   RBC 2.42 (L) 3.87 - 5.11 MIL/uL   Hemoglobin 7.8 (L) 12.0 - 15.0 g/dL   HCT 47.8 (L) 29.5 - 62.1 %   MCV 105.0 (H) 80.0 - 100.0 fL   MCH 32.2 26.0 - 34.0 pg   MCHC 30.7 30.0 - 36.0 g/dL   RDW 30.8 (H) 65.7 - 84.6 %   Platelets 161 150 - 400 K/uL   nRBC 0.0 0.0 - 0.2 %    Comment: Performed at [facility], [address] [place], [place] [address]  Vitamin B12     Status: None   Collection Time: 12/24/22  4:45 AM  Result Value Ref Range   Vitamin B-12 821 180 - 914 pg/mL    Comment: (NOTE) This assay is not validated for testing neonatal or myeloproliferative syndrome specimens for Vitamin B12 levels. Performed at [facility], [address] [place], [place] [address]   Folate     Status: None   Collection Time: 12/24/22  4:45 AM  Result Value Ref Range   Folate 34.8 >5.9 ng/mL    Comment: Performed at [facility], [address], [place], [place] [address]  Iron and TIBC     Status: None   Collection Time: 12/24/22  4:45 AM  Result Value Ref Range   Iron 66 28 - 170 ug/dL   TIBC NOT CALCULATED 027 - 450 ug/dL   Saturation Ratios NOT CALCULATED 10.4 - 31.8 %   UIBC NOT CALCULATED ug/dL    Comment: Performed at [facility], [address], [place], [place] [address]  Ferritin     Status: Abnormal   Collection Time: 12/24/22  4:45 AM  Result Value Ref Range   Ferritin 1,405 (H) 11 - 307 ng/mL    Comment: Performed at [facility], [address] [place], [place] [address]  Reticulocytes     Status: Abnormal   Collection Time: 12/24/22  4:45 AM  Result Value Ref Range   Retic Ct Pct 3.4 (H) 0.4 - 3.1 %   RBC. 2.40 (L) 3.87 - 5.11 MIL/uL   Retic Count,  Absolute 81.6 19.0 - 186.0 K/uL   Immature Retic Fract 12.8 2.3 - 15.9 %     Comment: Performed at [facility], [address] [place], [place] [address]  Lactic acid, plasma     Status: Abnormal   Collection Time: 12/24/22  4:45 AM  Result Value Ref Range   Lactic Acid, Venous 2.4 (HH) 0.5 - 1.9 mmol/L    Comment: CRITICAL VALUE NOTED. VALUE IS CONSISTENT WITH PREVIOUSLY REPORTED/CALLED VALUE Performed at [facility], [address], [place], [place] [address]    US Abdomen Limited RUQ (LIVER/GB)  Result Date: 12/24/2022 CLINICAL DATA:  Elevated LFTs. EXAM: ULTRASOUND ABDOMEN LIMITED RIGHT UPPER QUADRANT COMPARISON:  CT scan 06/20/2022.  Abdominal ultrasound 05/17/2020 FINDINGS: Gallbladder: Surgically absent. Common bile duct: Diameter: 6 mm Liver: Diffusely increased echogenicity of liver parenchyma without a discrete or focal abnormality. Portal vein is patent on color Doppler imaging with normal direction of blood flow towards the liver. Other: Cystic change noted right kidney. IMPRESSION: 1. Diffusely increased echogenicity of liver parenchyma without a discrete or focal abnormality. Imaging features compatible with hepatic steatosis. 2. Status post cholecystectomy. 3. Cystic changes noted in the right kidney, better characterized on previous CT imaging. Electronically Signed   By: Kennith Center M.D.   On: 12/24/2022 06:20   DG Chest 2 View  Result Date: 12/24/2022 CLINICAL DATA:  Lactic acidosis EXAM: CHEST - 2 VIEW COMPARISON:  10/21/2022 FINDINGS: Lungs are well expanded, symmetric, and clear. No pneumothorax or pleural effusion. Cardiac size within normal limits. Pulmonary vascularity is normal. Osseous structures are age-appropriate. No acute bone abnormality. IMPRESSION: No active cardiopulmonary disease. Electronically Signed   By: Helyn Numbers M.D.   On: 12/24/2022 00:05    Pending Labs Unresulted Labs (From admission, onward)     Start     Ordered   12/24/22 0500  Comprehensive metabolic panel  Daily,   R      12/24/22 0339   12/24/22  0500  CBC  Daily,   R      12/24/22 0339   12/24/22 0500  Hepatitis panel, acute  Tomorrow morning,   R        12/24/22 0400   12/23/22 2316  Blood culture (routine x 2)  BLOOD CULTURE X 2,   R (with STAT occurrences)      12/23/22 2317            Vitals/Pain Today's Vitals   12/24/22 0432 12/24/22 0530 12/24/22 0615 12/24/22 0700  BP:  (!) 136/50 (!) 162/76 139/63  Pulse:  63 63 61  Resp:  18 17 17   Temp:      TempSrc:      SpO2:  96% 100% 98%  PainSc: 2        Isolation Precautions No active isolations  Medications Medications  aspirin chewable tablet 81 mg (has no administration in time range)  amiodarone (PACERONE) tablet 200 mg (has no administration in time range)  sevelamer carbonate (RENVELA) tablet 800 mg (has no administration in time range)  cycloSPORINE (RESTASIS) 0.05 % ophthalmic emulsion 1 drop (has no administration in time range)  cefTRIAXone (ROCEPHIN) 2 g in sodium chloride 0.9 % 100 mL IVPB (has no administration in time range)  insulin aspart (novoLOG) injection 0-6 Units (has no administration in time range)  insulin aspart (novoLOG) injection 0-5 Units (has no administration in time range)  heparin injection 5,000 Units (5,000 Units Subcutaneous Not Given 12/24/22 0617)  sodium chloride  flush (NS) 0.9 % injection 3 mL (3 mLs Intravenous Not Given 12/24/22 0430)  rOPINIRole (REQUIP) tablet 0.25 mg (has no administration in time range)    And  rOPINIRole (REQUIP) tablet 0.5 mg (has no administration in time range)  lipase/protease/amylase (CREON) capsule 72,000 Units (has no administration in time range)    And  lipase/protease/amylase (CREON) capsule 36,000 Units (has no administration in time range)  sodium chloride 0.9 % bolus 500 mL (0 mLs Intravenous Stopped 12/24/22 0224)  ceFEPIme (MAXIPIME) 2 g in sodium chloride 0.9 % 100 mL IVPB (0 g Intravenous Stopped 12/24/22 0128)  vancomycin (VANCOREADY) IVPB 1250 mg/250 mL (0 mg Intravenous Stopped  12/24/22 0321)  acetaminophen (TYLENOL) tablet 650 mg (650 mg Oral Given 12/24/22 0331)    Mobility walks     Focused Assessments Cardiac Assessment Handoff:    Lab Results  Component Value Date   TROPONINI <0.30 06/18/2013   No results found for: "DDIMER" Does the Patient currently have chest pain? No    R Recommendations: See Admitting Provider Note  Report given to:   Additional Notes:

## 2022-12-24 NOTE — TOC Initial Note (Addendum)
Transition of Care [facility]) - Initial/Assessment Note    Patient Details  Name: Kim Burns MRN: [id number] Date of Birth: Mar 08, 1935  Transition of Care [facility]) CM/SW Contact:    Michaela Corner, LCSWA Phone Number: 12/24/2022, 10:44 AM  Clinical Narrative:    CSW met pt at bedside to complete assessment. Pt states she lives alone at home but that her son and daughter stay with her most of the time. Pt states she "could drive if my kids would let me." Her son and daughter take turns driving pt around. Pt states she has a PCP and uses Pharmacist, hospital. Pt reports having the following DME: bedside commode, bench in shower, wheelchair, RW, rollator, cane, and a ramp. CSW asked pt about HH services, pt states she's believed to have had [facility] before but is not sure.    Patient Goals and CMS Choice Patient states their goals for this hospitalization and ongoing recovery are:: To go home          Expected Discharge Plan and Services In-house Referral: Clinical Social Work     Living arrangements for the past 2 months: Single Family Home                                      Prior Living Arrangements/Services Living arrangements for the past 2 months: Single Family Home Lives with:: Self (Daughter and son check on pt periodically) Patient language and need for interpreter reviewed:: Yes Do you feel safe going back to the place where you live?: Yes      Need for Family Participation in Patient Care: No (Comment) Care giver support system in place?: Yes (comment) (Son and daughter) Current home services: DME, Home PT, Home OT Criminal Activity/Legal Involvement Pertinent to Current Situation/Hospitalization: No - Comment as needed  Activities of Daily Living   ADL Screening (condition at time of admission) Independently performs ADLs?: Yes (appropriate for developmental age) Is the patient deaf or have difficulty hearing?: No Does the  patient have difficulty seeing, even when wearing glasses/contacts?: No Does the patient have difficulty concentrating, remembering, or making decisions?: No  Permission Sought/Granted                  Emotional Assessment Appearance:: Appears stated age Attitude/Demeanor/Rapport: Engaged Affect (typically observed): Pleasant, Appropriate, Calm Orientation: : Oriented to Self, Oriented to Place, Oriented to  Time, Oriented to Situation Alcohol / Substance Use: Not Applicable Psych Involvement: No (comment)  Admission diagnosis:  Leg swelling [M79.89] Cellulitis of left lower extremity [L03.116] Patient Active Problem List   Diagnosis Date Noted   Cellulitis of left lower extremity 12/24/2022   Elevated LFTs 12/24/2022   TBI (traumatic brain injury) (HCC) 10/31/2022   Lacunar infarct, acute (HCC) 10/27/2022   Acute systolic heart failure (HCC) 10/26/2022   Atrial fibrillation, rapid (HCC) 10/26/2022   Pressure injury of skin 10/26/2022   Traumatic brain injury (HCC) 10/21/2022   Contusion of right cerebral hemisphere (HCC) 10/21/2022   Closed skull fracture (HCC) 10/21/2022   Sacral fracture, closed (HCC) 10/21/2022   Exocrine pancreatic insufficiency 10/21/2022   Hypertension 10/21/2022   Hypomagnesemia 09/18/2022   RLS (restless legs syndrome) 09/18/2022   HSV (herpes simplex virus) dendritic keratitis 09/18/2022   Transaminitis 09/18/2022   Atrial fibrillation with RVR (HCC) 09/14/2022   Essential hypertension 09/14/2022   Erythropoietin (EPO) stimulating agent anemia management patient  07/16/2022   Hematoma of left kidney 01/22/2022   Kidney, perinephric abscess 11/02/2021   Bacteremia due to Streptococcus 11/01/2021   Lactic acidosis 10/30/2021   Fever 10/30/2021   Severe sepsis (HCC) 10/29/2021   Hydronephrosis of left kidney 10/29/2021   Chronic diastolic CHF (congestive heart failure) (HCC) 10/29/2021   Coronary artery disease involving native coronary artery  of native heart without angina pectoris 10/29/2021   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) 10/29/2021   Chest pain of uncertain etiology    Pulmonary nodule 08/26/2021   Paroxysmal atrial fibrillation (HCC) 08/26/2021   End-stage renal disease on hemodialysis (HCC) 08/25/2021   Renal cell carcinoma of left kidney (HCC) 08/22/2021   DM2 (diabetes mellitus, type 2) (HCC) 11/14/2016   Hypokalemia 11/12/2016   Melena    History of anemia due to chronic kidney disease    Acute renal failure (ARF) (HCC) 10/03/2016   GERD (gastroesophageal reflux disease) 04/29/2016   Hyperglycemia 04/29/2016   IPMN (intraductal papillary mucinous neoplasm) 04/23/2016   History of stroke 04/16/2016   Sepsis (HCC) 06/18/2013   Localized swelling, mass and lump, neck 03/29/2013   Sinus bradycardia 09/18/2012   Fatigue 09/18/2012   PCP:  Lewis Moccasin, MD Pharmacy:   [facility] [address], [place] - [address] [place] [address] Phone: [phone] Fax: [phone]  [facility] - [place], [place] - [address] [facility] [address] [place] [address] [place] [address] Phone: [phone] Fax: [phone]  [facility] [address] [place] [address] Phone: [phone] Fax: [phone]  [facility] [address] [place] - [address] [place] [address] Phone: [phone] Fax: [phone]     Social Determinants of Health (SDOH) Social History: SDOH Screenings   Food Insecurity: No Food Insecurity (12/24/2022)  Housing: Low Risk  (12/24/2022)  Transportation Needs: No Transportation Needs (12/24/2022)  Utilities: Not At Risk (12/24/2022)  Tobacco Use: Low Risk  (12/24/2022)   SDOH Interventions:     Readmission Risk Interventions    10/30/2022    3:26 PM 08/27/2021   10:16 AM  Readmission Risk Prevention  Plan  Transportation Screening Complete Complete  PCP or Specialist Appt within 3-5 Days  Complete  HRI or Home Care Consult  Complete  Social Work Consult for Recovery Care Planning/Counseling  Complete  Palliative Care Screening  Not Applicable  Medication Review Oceanographer) Complete Complete  HRI or Home Care Consult Complete   SW Recovery Care/Counseling Consult Complete   Palliative Care Screening Not Applicable   Skilled Nursing Facility Not Applicable

## 2022-12-24 NOTE — Progress Notes (Signed)
    Patient: Kim Burns ZJI:[id number] DOB: 19-Feb-1935      Brief hospital course: 87 y.o. F with ESRD on HD MWF, DM, RCC s/p ablation, IPMN s/p Whipple, pAF not on AC, dCHF, hx CVA and OSA not on CPAP who presented with worsening hypotension with dialysis and left leg cellulitis.  This is a no charge note, for further details, please see the H&P by my partner, Dr. Antionette Char from earlier today.   Principal Problem:   Cellulitis of left lower extremity Active Problems:   End-stage renal disease on hemodialysis (HCC)   Paroxysmal atrial fibrillation (HCC)   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)   Chronic diastolic CHF (congestive heart failure) (HCC)   Coronary artery disease involving native coronary artery of native heart without angina pectoris   Exocrine pancreatic insufficiency   Elevated LFTs   Malnutrition of moderate degree   Cerebrovascular disease   OSA (obstructive sleep apnea)   Hypoalbuminemia    Cellulitis - Continue Rocephin   ESRD Intradialytic hypotension Hypoalbuminemia Discussed with Nephrology and son.  This is a poor prognostic sign.  If this does not improve with treatment of infection, it is unclear if any particular interventions will help. - Consult Nephrology for HD - Consult Palliative care   Transaminitis [place] normal.  No focal symptoms.  LFTs improving. - Trend LFTs      Physical Exam: BP (!) 158/71 (BP Location: Left Arm)   Pulse 64   Temp 98.3 F (36.8 C) (Oral)   Resp 18   Ht 5\' 3"  (1.6 m)   Wt 55.4 kg   SpO2 (!) 80%   BMI 21.64 kg/m   Patient seen and examined.      Family Communication: Son by phone    Disposition: Inpatient Patient has pain and is still unable to walk.  Continue IV antibiotics.  Will need HD tomorrow, PT eval, and may need SNF in 2-3 days     Author: Alberteen Sam, MD 12/24/2022 4:59 PM

## 2022-12-24 NOTE — Progress Notes (Signed)
Initial Nutrition Assessment  DOCUMENTATION CODES:   Non-severe (moderate) malnutrition in context of chronic illness  INTERVENTION:  Liberalize diet Ensure Plus High Protein po BID, each supplement provides 350 kcal and 20 grams of protein. ( Patient prefers strawberry)    NUTRITION DIAGNOSIS:   Moderate Malnutrition related to chronic illness as evidenced by moderate muscle depletion, moderate fat depletion.    GOAL:   Patient will meet greater than or equal to 90% of their needs    MONITOR:   PO intake, Supplement acceptance, Weight trends, Labs, I & O's  REASON FOR ASSESSMENT:   Consult Assessment of nutrition requirement/status  ASSESSMENT:  87 y.o. F, Admitted from home with cellulitis of lower extremities. PMH: IPMN of pancreas status post Whipple procedure, insulin-dependent diabetes mellitus, RCC status post ablation, ESRD on hemodialysis, PAF not anticoagulated, HFmrEF, history of CVA, and OSA with CPAP intolerance. Review of EMR revealed EDW 55 kg. Not on vitamin regimen. No significant weight change. NFPE performed and found to have moderate to mild fat and muscle loss, Edema in lower legs related to cellulitis. Although weight trends stable it is suspected that fat and muscle depletion is not age related.  Since pt moderately active prior to illness.  Patient sleeping at time of visit but woke easy to RD touch. She stated that she lives at home and that her son has been staying with her. She receives 2 meals through insurance program, that her and her son share. She admits to not following renal diet at home. Working on eating more protein.  Has been on dialysis since January of 2023. She asked about extra protein discussed prosource she was not interested in this at thsi time. She also about when she got home and informed her that powdered protein would be a good source to add to meals.   She said she does not like ensure but then admitted that it was chocolate and  she does not like chocolate. Agreeable to try ensure BID strawberry, Requested a liberalized diet, There is no benefit to excessive dietary restrictions related advanced age, patients request, and increased nutrient needs . Patient would benefit better from liberalized diet to help increased nutrient needs and promote better oral intake.   Admit weight: 55.4 kg Current weight: 55.4 kg  Weight history: 12/24/22 55.4 kg  12/16/22 55.7 kg  11/29/22 54.7 kg  11/15/22 52.5 kg  10/30/22 55.4 kg  10/01/22 54.9 kg  09/24/22 55 kg  08/20/22 56.4 kg  07/12/22 57.4 kg  06/21/22 59.2 kg      Average Meal Intake:  Stated 25-50% of most meals  Nutritionally Relevant Medications: Scheduled Meds:  rOPINIRole  0.25 mg Oral q morning   And   rOPINIRole  0.5 mg Oral QHS   sevelamer carbonate  800 mg Oral TID WC    Continuous Infusions:  [START ON 12/25/2022] albumin human     cefTRIAXone (ROCEPHIN)  IV 2 g (12/24/22 0941)   PRN Meds:.cycloSPORINE, lipase/protease/amylase **AND** lipase/protease/amylase  Labs Reviewed:  CBG ranges from 174-94 mg/dL over the last 24 hours HgbA1c 6.1 (9/8)    NUTRITION - FOCUSED PHYSICAL EXAM:  Flowsheet Row Most Recent Value  Orbital Region Moderate depletion  Upper Arm Region Moderate depletion  Thoracic and Lumbar Region No depletion  Buccal Region No depletion  Temple Region Moderate depletion  Clavicle Bone Region Moderate depletion  Clavicle and Acromion Bone Region Moderate depletion  Scapular Bone Region Unable to assess  Dorsal Hand Mild depletion  Patellar  Region No depletion  Anterior Thigh Region No depletion  Posterior Calf Region Unable to assess  Edema (RD Assessment) Severe  Hair Reviewed  Eyes Reviewed  Mouth Reviewed  Skin Reviewed  Nails Reviewed       Diet Order:   Diet Order             Diet renal with fluid restriction Fluid restriction: 1200 mL Fluid; Room service appropriate? Yes; Fluid consistency: Thin  Diet  effective now                   EDUCATION NEEDS:   Education needs have been addressed  Skin:  Skin Assessment: Reviewed RN Assessment  Last BM:  10/24/22  Height:   Ht Readings from Last 1 Encounters:  12/24/22 5\' 3"  (1.6 m)    Weight:   Wt Readings from Last 1 Encounters:  12/24/22 55.4 kg    Ideal Body Weight:     BMI:  Body mass index is 21.64 kg/m.  Estimated Nutritional Needs:   Kcal:  1700-1941 cal/d  Protein:  75-85 g/d  Fluid:  >/=1530ml/day    Jamelle Haring RDN, LDN Clinical Dietitian  RDN pager # available on Amion

## 2022-12-24 NOTE — Progress Notes (Signed)
Hypoglycemic Event  CBG: 61  Treatment: 4 oz juice/soda  Symptoms: Hungry  Follow-up CBG: Time: 0946 CBG Result: 80  Possible Reasons for Event: inadequate meal intake-admission from ED. No food since yesterday evening.    Kim Burns

## 2022-12-24 NOTE — Hospital Course (Signed)
Ms. Scroggs is an 87 y.o. Female with ESRD on HD MWF, DM, RCC s/p ablation, IPMN s/p Whipple, PAF not on AC, dCHF, hx CVA and OSA not on CPAP who presented with worsening hypotension with dialysis and left leg cellulitis.

## 2022-12-24 NOTE — Consult Note (Signed)
[facility] Renal Consultation Note    Indication for Consultation:  Management of ESRD/hemodialysis, anemia, hypertension/volume, and secondary hyperparathyroidism.  HPI: Kim Burns is a 87 y.o. female with PMH including ESRD on dialysis, pancreatic cancer s/p whipple procedure, IDDM, PAF, CHF, CVA and OSA who presented to the ED with lower extremity swelling and redness. Patient reported worsening edema of the past several weeks, reports her BP had been dropping on dialysis despite midodrine, which was limiting UF. Recently the left leg became erythematous, hot, tender, and blistering. Primary team was concerned for cellulitis and she was started on vancomycin and cefepime, was also give a bolus of NS. Of note, she was recently hospitalized for TBI and CIR, discharged to her son's home and returned back home recently. Nephrology consulted for management of ESRD. Patient attends dialysis on MWF schedule, last HD 12/23/22. EDW is 55kg, left HD at 55.4kg. Her BP does start high but has been dropping to 70's/40's at recent treatments.  Patient reports feeling a little bit better. Reports legs are still very red and tender. Reports ongoing fatigue and weakness since her TBI, and also has vertigo/dizziness since TBI. She denies SOB, orthopnea, HA, CP, abdominal pain, N/V/D, dysuria, hematuria, fever and chills.   Past Medical History:  Diagnosis Date   Anemia of chronic disease    takes iron   Anxiety    Blood transfusion without reported diagnosis    Bradycardia    Diabetes mellitus without complication (HCC)    became diabetic after Whipple procedure   Diarrhea    Dysrhythmia 04/16/2016   bradycardia due to medication    ESRD on hemodialysis [facility])    M-W-F   GERD (gastroesophageal reflux disease)    Gout    Headache    History of kidney stones 06/2013   Hyperparathyroidism (HCC)    Hypertension    PONV (postoperative nausea and vomiting)    Sleep apnea    no cpap  machine. could not tolerate   Stroke (HCC) 04/16/2016   TIA 1995   Vitamin D deficiency    Past Surgical History:  Procedure Laterality Date   A/V FISTULAGRAM Left 08/30/2021   Procedure: A/V Fistulagram;  Surgeon: Cephus Shelling, MD;  Location: [facility] INVASIVE CV LAB;  Service: Cardiovascular;  Laterality: Left;   ABDOMINAL HYSTERECTOMY  1985   complete   BACK SURGERY  1980   lower   BASCILIC VEIN TRANSPOSITION Left 04/24/2017   Procedure: LEFT ARM FIRST STAGE BASILIC VEIN TRANSPOSITION;  Surgeon: Nada Libman, MD;  Location: [facility] OR;  Service: Vascular;  Laterality: Left;   BASCILIC VEIN TRANSPOSITION Left 07/10/2017   Procedure: SECOND STAGE BASILIC VEIN TRANSPOSITION LEFT ARM;  Surgeon: Nada Libman, MD;  Location: [facility] OR;  Service: Vascular;  Laterality: Left;   BREAST LUMPECTOMY Left x 2   many years apart, benign   CHOLECYSTECTOMY  1985   CYSTOSCOPY W/ URETERAL STENT PLACEMENT Left 10/31/2021   Procedure: CYSTOSCOPY WITH RETROGRADE PYELOGRAM/URETERAL STENT PLACEMENT;  Surgeon: Crist Fat, MD;  Location: [facility] OR;  Service: Urology;  Laterality: Left;   CYSTOSCOPY WITH URETEROSCOPY AND STENT PLACEMENT Left 06/18/2013   Procedure: CYSTOSCOPY WITH Lef URETEROSCOPY AND Left STENT PLACEMENT;  Surgeon: Crecencio Mc, MD;  Location: [facility] ORS;  Service: Urology;  Laterality: Left;   ESOPHAGOGASTRODUODENOSCOPY (EGD) WITH PROPOFOL N/A 11/13/2016   Procedure: ESOPHAGOGASTRODUODENOSCOPY (EGD) WITH PROPOFOL;  Surgeon: Kathi Der, MD;  Location: [facility] ENDOSCOPY;  Service: Gastroenterology;  Laterality: N/A;   EUS  N/A 02/07/2016   Procedure: ESOPHAGEAL ENDOSCOPIC ULTRASOUND (EUS) RADIAL;  Surgeon: Willis Modena, MD;  Location: [facility] ENDOSCOPY;  Service: Endoscopy;  Laterality: N/A;   EYE SURGERY Bilateral 2014   ioc for catracts    FOOT SURGERY Left 1990   something with toes unsure what    HERNIA REPAIR  2018   IR CATHETER TUBE CHANGE  03/14/2022   IR EMBO TUMOR ORGAN ISCHEMIA  INFARCT INC GUIDE ROADMAPPING  08/22/2021   IR NEPHROSTOMY EXCHANGE LEFT  06/27/2022   IR NEPHROSTOMY EXCHANGE LEFT  08/26/2022   IR NEPHROSTOMY EXCHANGE LEFT  10/22/2022   IR NEPHROSTOMY EXCHANGE LEFT  12/16/2022   IR NEPHROSTOMY PLACEMENT LEFT  03/14/2022   IR RADIOLOGIST EVAL & MGMT  05/18/2021   IR RADIOLOGIST EVAL & MGMT  10/01/2021   IR RADIOLOGIST EVAL & MGMT  11/12/2021   IR RADIOLOGIST EVAL & MGMT  11/22/2021   IR RADIOLOGIST EVAL & MGMT  11/27/2021   IR RADIOLOGIST EVAL & MGMT  12/20/2021   IR RENAL SUPRASEL UNI S&I MOD SED  08/22/2021   IR US GUIDE VASC ACCESS RIGHT  08/22/2021   LEFT HEART CATH AND CORONARY ANGIOGRAPHY N/A 08/27/2021   Procedure: LEFT HEART CATH AND CORONARY ANGIOGRAPHY;  Surgeon: Kathleene Hazel, MD;  Location: [facility] INVASIVE CV LAB;  Service: Cardiovascular;  Laterality: N/A;   PARATHYROID EXPLORATION     PERIPHERAL VASCULAR BALLOON ANGIOPLASTY Left 08/30/2021   Procedure: PERIPHERAL VASCULAR BALLOON ANGIOPLASTY;  Surgeon: Cephus Shelling, MD;  Location: [facility] INVASIVE CV LAB;  Service: Cardiovascular;  Laterality: Left;  arm fistula   RADIOLOGY WITH ANESTHESIA Left 08/22/2021   Procedure: MICROWAVE ABLATION;  Surgeon: Simonne Come, MD;  Location: [facility] ORS;  Service: Anesthesiology;  Laterality: Left;   WHIPPLE PROCEDURE N/A 04/23/2016   Procedure: WHIPPLE PROCEDURE;  Surgeon: Almond Lint, MD;  Location: [facility] OR;  Service: General;  Laterality: N/A;   Family History  Problem Relation Age of Onset   Heart disease Mother    Stroke Mother    Cancer Father        colon   Alzheimer's disease Sister    Alzheimer's disease Sister    Cancer Brother        brain   Breast cancer Neg Hx    Social History:  reports that she has never smoked. She has never used smokeless tobacco. She reports that she does not drink alcohol and does not use drugs.  ROS: As per HPI otherwise negative.   Physical Exam: Vitals:   12/24/22 0615 12/24/22 0700 12/24/22 0738 12/24/22 0807  BP:  (!) 162/76 139/63  (!) 153/70  Pulse: 63 61  61  Resp: 17 17  18   Temp:   (!) 96.8 F (36 C) 98 F (36.7 C)  TempSrc:   Axillary   SpO2: 100% 98%  100%  Weight:    55.4 kg  Height:    5\' 3"  (1.6 m)     General: Alert female in NAD Head: Normocephalic, atraumatic, sclera non-icteric, mucus membranes are moist. Lungs: Clear bilaterally to auscultation without wheezes, rales, or rhonchi. Breathing is unlabored. Heart: RRR with normal S1, S2. No murmurs, rubs, or gallops appreciated. Abdomen: Soft, non-tender, non-distended with normoactive bowel sounds. No rebound/guarding. No obvious abdominal masses. Lower extremities: 2+ edema b/l lower extremities. L leg erythematous, warm, + large blister on skin Neuro: Alert and oriented X 3. Moves all extremities spontaneously. Psych:  Responds to questions appropriately with a normal affect. Dialysis Access: AVF +  thrill/bruit  Allergies  Allergen Reactions   Lopid [Gemfibrozil] Other (See Comments)    Myalgia    Lotemax [Loteprednol Etabonate] Rash    Skin burning  Blurry vision   Statins Other (See Comments)    Rosuvastatin Muscle weakness   Norco [Hydrocodone-Acetaminophen] Other (See Comments)    Headaches  Tolerates acetaminophen    Other Diarrhea    Real Butter - diarrhea   Vibra-Tab [Doxycycline] Other (See Comments)    Unknown reaction   Amlodipine Other (See Comments)    Norvasc   Codeine Other (See Comments)    headache   Hydrocodone Other (See Comments)   Hydrocodone-Acetaminophen Other (See Comments)    Headache. Tolerates acetaminophen.   Lisinopril Other (See Comments)   Verapamil Other (See Comments)   Prior to Admission medications   Medication Sig Start Date End Date Taking? Authorizing Provider  acetaminophen (TYLENOL) 500 MG tablet Take 1 tablet (500 mg total) by mouth 3 (three) times daily. 11/13/22   Love, Evlyn Kanner, PA-C  amantadine (SYMMETREL) 100 MG capsule Take 1 capsule (100 mg total) by mouth once a  week. 11/20/22   Love, Evlyn Kanner, PA-C  amiodarone (PACERONE) 200 MG tablet Take 1 tablet (200 mg total) by mouth daily. 11/15/22   Love, Evlyn Kanner, PA-C  aspirin 81 MG chewable tablet Chew 1 tablet (81 mg total) by mouth daily. 11/13/22   Love, Evlyn Kanner, PA-C  B Complex-C-Zn-Folic Acid (DIALYVITE 800 WITH ZINC) 0.8 MG TABS Take 1 tablet by mouth daily.    [provider]  benzonatate (TESSALON) 100 MG capsule Take 1 capsule (100 mg total) by mouth 3 (three) times daily as needed for cough. Patient not taking: Reported on 12/16/2022 11/13/22   Love, Evlyn Kanner, PA-C  cycloSPORINE (RESTASIS) 0.05 % ophthalmic emulsion Place 1 drop into both eyes daily as needed (irritation). 11/13/22   Love, Evlyn Kanner, PA-C  guaiFENesin (MUCINEX) 600 MG 12 hr tablet Take 2 tablets (1,200 mg total) by mouth 2 (two) times daily. Patient not taking: Reported on 12/16/2022 11/13/22   Love, Evlyn Kanner, PA-C  lidocaine (LIDODERM) 5 % Place 1 patch onto the skin daily. Remove & Discard patch within 12 hours or as directed by MD Patient not taking: Reported on 12/16/2022 11/14/22   Jacquelynn Cree, PA-C  lidocaine-prilocaine (EMLA) cream Apply 1 Application topically every Monday, Wednesday, and Friday with hemodialysis.    [provider]  lipase/protease/amylase (CREON) 36000 UNITS CPEP capsule Take 36,000-72,000 Units by mouth See admin instructions. Take 72,000 units (2 capsules) by mouth with meals and 36,000 units (1 capsule) with snacks.    [provider]  meclizine (ANTIVERT) 12.5 MG tablet Take 1 tablet (12.5 mg total) by mouth 3 (three) times daily as needed for dizziness. Patient not taking: Reported on 12/16/2022 10/30/22   Miguel Rota, MD  metoprolol succinate (TOPROL-XL) 25 MG 24 hr tablet Take 1 tablet (25 mg total) by mouth daily. Patient taking differently: Take 25 mg by mouth daily. Metoprolol if systolic BP (top number) is less than 110. 11/29/22   Jacalyn Lefevre L, NP  olopatadine (PATADAY)  0.1 % ophthalmic solution Place 1 drop into both eyes 2 (two) times daily as needed for allergies. Patient not taking: Reported on 12/16/2022    [provider]  rOPINIRole (REQUIP) 0.25 MG tablet Take 1 tablet (0.25 mg total) by mouth 2 (two) times daily. Patient taking differently: Take 0.25-0.5 mg by mouth See admin instructions. Take 0.5mg  (1 tablet) by  mouth every night at bedtime and 0.25mg  (1/2 tablet) in the mornings as needed. 09/24/22   Rodolph Bong, MD  sevelamer carbonate (RENVELA) 800 MG tablet Take 1 tablet (800 mg total) by mouth 3 (three) times daily with meals. 11/13/22   Love, Evlyn Kanner, PA-C  valACYclovir (VALTREX) 500 MG tablet Take 1 tablet (500 mg total) by mouth at bedtime. Patient not taking: Reported on 12/16/2022 09/24/22   Rodolph Bong, MD   Current Facility-Administered Medications  Medication Dose Route Frequency Provider Last Rate Last Admin   amiodarone (PACERONE) tablet 200 mg  200 mg Oral Daily Opyd, Lavone Neri, MD       aspirin chewable tablet 81 mg  81 mg Oral Daily Opyd, Lavone Neri, MD       cefTRIAXone (ROCEPHIN) 2 g in sodium chloride 0.9 % 100 mL IVPB  2 g Intravenous Q24H Opyd, Lavone Neri, MD       cycloSPORINE (RESTASIS) 0.05 % ophthalmic emulsion 1 drop  1 drop Both Eyes Daily PRN Opyd, Lavone Neri, MD       heparin injection 5,000 Units  5,000 Units Subcutaneous Q8H Opyd, Lavone Neri, MD       insulin aspart (novoLOG) injection 0-5 Units  0-5 Units Subcutaneous QHS Opyd, Lavone Neri, MD       insulin aspart (novoLOG) injection 0-6 Units  0-6 Units Subcutaneous TID WC Opyd, Lavone Neri, MD       lipase/protease/amylase (CREON) capsule 72,000 Units  72,000 Units Oral TID WC Opyd, Lavone Neri, MD       And   lipase/protease/amylase (CREON) capsule 36,000 Units  36,000 Units Oral BID BM PRN Opyd, Lavone Neri, MD       rOPINIRole (REQUIP) tablet 0.25 mg  0.25 mg Oral q morning Opyd, Lavone Neri, MD       And   rOPINIRole (REQUIP) tablet 0.5 mg  0.5 mg Oral  QHS Opyd, Lavone Neri, MD       sevelamer carbonate (RENVELA) tablet 800 mg  800 mg Oral TID WC Opyd, Lavone Neri, MD       sodium chloride flush (NS) 0.9 % injection 3 mL  3 mL Intravenous Q12H Briscoe Deutscher, MD       Labs: Basic Metabolic Panel: Recent Labs  Lab 12/23/22 1916 12/24/22 0445  NA 141 142  K 4.0 3.8  CL 100 103  CO2 29 27  GLUCOSE 174* 94  BUN 17 21  CREATININE 2.99* 3.21*  CALCIUM 7.5* 7.3*   Liver Function Tests: Recent Labs  Lab 12/23/22 1916 12/24/22 0445  AST 63* 52*  ALT 104* 90*  ALKPHOS 128* 112  BILITOT 0.6 0.6  PROT 4.6* 4.2*  ALBUMIN 1.9* 1.7*   No results for input(s): "LIPASE", "AMYLASE" in the last 168 hours. No results for input(s): "AMMONIA" in the last 168 hours. CBC: Recent Labs  Lab 12/23/22 1916 12/24/22 0445  WBC 9.1 9.3  NEUTROABS 7.5  --   HGB 8.6* 7.8*  HCT 28.0* 25.4*  MCV 104.1* 105.0*  PLT 178 161   Cardiac Enzymes: No results for input(s): "CKTOTAL", "CKMB", "CKMBINDEX", "TROPONINI" in the last 168 hours. CBG: Recent Labs  Lab 12/23/22 1932 12/24/22 0809  GLUCAP 128* 61*   Iron Studies:  Recent Labs    12/24/22 0445  IRON 66  TIBC NOT CALCULATED  FERRITIN 1,405*   Studies/Results: US Abdomen Limited RUQ (LIVER/GB)  Result Date: 12/24/2022 CLINICAL DATA:  Elevated LFTs. EXAM: ULTRASOUND ABDOMEN LIMITED RIGHT UPPER  QUADRANT COMPARISON:  CT scan 06/20/2022.  Abdominal ultrasound 05/17/2020 FINDINGS: Gallbladder: Surgically absent. Common bile duct: Diameter: 6 mm Liver: Diffusely increased echogenicity of liver parenchyma without a discrete or focal abnormality. Portal vein is patent on color Doppler imaging with normal direction of blood flow towards the liver. Other: Cystic change noted right kidney. IMPRESSION: 1. Diffusely increased echogenicity of liver parenchyma without a discrete or focal abnormality. Imaging features compatible with hepatic steatosis. 2. Status post cholecystectomy. 3. Cystic changes noted  in the right kidney, better characterized on previous CT imaging. Electronically Signed   By: Kennith Center M.D.   On: 12/24/2022 06:20   DG Chest 2 View  Result Date: 12/24/2022 CLINICAL DATA:  Lactic acidosis EXAM: CHEST - 2 VIEW COMPARISON:  10/21/2022 FINDINGS: Lungs are well expanded, symmetric, and clear. No pneumothorax or pleural effusion. Cardiac size within normal limits. Pulmonary vascularity is normal. Osseous structures are age-appropriate. No acute bone abnormality. IMPRESSION: No active cardiopulmonary disease. Electronically Signed   By: Helyn Numbers M.D.   On: 12/24/2022 00:05    Dialysis Orders:  [facility]: [facility]  on MWF . 160NRe 3 hr 30 min BFR 350 DFR Auto 1.5 EDW 55kg 2K 2Ca AVF 16g  No heparin Mircera IV q 2 weeks- has not received ESA recently  Hectorol IV q HD  Assessment/Plan:  Lower leg cellulitis: On antibiotics per admitting team  ESRD:  On MWF schedule, next HD tomorrow  Hypertension/volume: BP has been dropping on HD despite midodrine. Takes 10mg  pre-HD and occasionally an extra dose mid HD. Will continue midodrine and order PRN albumin with HD tomorrow. May need to increase her midodrine dose.   Anemia: Hgb 7.8, does not appear she has received ESA since last admission, will order with HD tomorrow  Metabolic bone disease: Corrected calcium controlled, will check phos. Continue VDRA and binders  Nutrition:  Alb 1.7, will need renal diet and protein supplements Recent TBI/CIR admission: improved but reports she is still weak. Gets PT at home. Holding heparin with HD.   Rogers Blocker, PA-C 12/24/2022, 8:47 AM  [facility] Pager: [phone]

## 2022-12-24 NOTE — Progress Notes (Signed)
Pt receives out-pt HD at [facility] on MWF. Will assist as needed.   Tracy Mounce Renal Navigator [phone] 

## 2022-12-25 DIAGNOSIS — Z515 Encounter for palliative care: Secondary | ICD-10-CM

## 2022-12-25 DIAGNOSIS — I959 Hypotension, unspecified: Secondary | ICD-10-CM

## 2022-12-25 DIAGNOSIS — E8809 Other disorders of plasma-protein metabolism, not elsewhere classified: Secondary | ICD-10-CM

## 2022-12-25 DIAGNOSIS — N186 End stage renal disease: Secondary | ICD-10-CM | POA: Diagnosis not present

## 2022-12-25 DIAGNOSIS — L03116 Cellulitis of left lower limb: Secondary | ICD-10-CM | POA: Diagnosis not present

## 2022-12-25 DIAGNOSIS — M7989 Other specified soft tissue disorders: Secondary | ICD-10-CM

## 2022-12-25 LAB — COMPREHENSIVE METABOLIC PANEL
ALT: 63 U/L — ABNORMAL HIGH (ref 0–44)
AST: 22 U/L (ref 15–41)
Albumin: 1.6 g/dL — ABNORMAL LOW (ref 3.5–5.0)
Alkaline Phosphatase: 103 U/L (ref 38–126)
Anion gap: 16 — ABNORMAL HIGH (ref 5–15)
BUN: 37 mg/dL — ABNORMAL HIGH (ref 8–23)
CO2: 23 mmol/L (ref 22–32)
Calcium: 7.9 mg/dL — ABNORMAL LOW (ref 8.9–10.3)
Chloride: 98 mmol/L (ref 98–111)
Creatinine, Ser: 3.75 mg/dL — ABNORMAL HIGH (ref 0.44–1.00)
GFR, Estimated: 11 mL/min — ABNORMAL LOW (ref >60)
Glucose, Bld: 142 mg/dL — ABNORMAL HIGH (ref 70–99)
Potassium: 4 mmol/L (ref 3.5–5.1)
Sodium: 137 mmol/L (ref 135–145)
Total Bilirubin: 0.6 mg/dL (ref <1.2)
Total Protein: 4.1 g/dL — ABNORMAL LOW (ref 6.5–8.1)

## 2022-12-25 LAB — CBC
HCT: 23.8 % — ABNORMAL LOW (ref 36.0–46.0)
Hemoglobin: 7.3 g/dL — ABNORMAL LOW (ref 12.0–15.0)
MCH: 31.3 pg (ref 26.0–34.0)
MCHC: 30.7 g/dL (ref 30.0–36.0)
MCV: 102.1 fL — ABNORMAL HIGH (ref 80.0–100.0)
Platelets: 161 10*3/uL (ref 150–400)
RBC: 2.33 MIL/uL — ABNORMAL LOW (ref 3.87–5.11)
RDW: 15.8 % — ABNORMAL HIGH (ref 11.5–15.5)
WBC: 8.9 10*3/uL (ref 4.0–10.5)
nRBC: 0 % (ref 0.0–0.2)

## 2022-12-25 LAB — GLUCOSE, CAPILLARY
Glucose-Capillary: 115 mg/dL — ABNORMAL HIGH (ref 70–99)
Glucose-Capillary: 74 mg/dL (ref 70–99)
Glucose-Capillary: 103 mg/dL — ABNORMAL HIGH (ref 70–99)
Glucose-Capillary: 164 mg/dL — ABNORMAL HIGH (ref 70–99)

## 2022-12-25 LAB — HEPATITIS B SURFACE ANTIBODY, QUANTITATIVE: Hep B S AB Quant (Post): <3.5 m[IU]/mL — ABNORMAL LOW

## 2022-12-25 MED ORDER — LIDOCAINE-PRILOCAINE 2.5-2.5 % EX CREA: 1.0000 | TOPICAL_CREAM | CUTANEOUS | Status: DC | PRN

## 2022-12-25 MED ORDER — MIDODRINE HCL 5 MG PO TABS
10.0000 mg | ORAL_TABLET | ORAL | Status: DC
  Administered 2022-12-25 – 2023-01-01 (×4): 10 mg via ORAL
  Filled 2022-12-25 (×5): qty 2

## 2022-12-25 MED ORDER — DARBEPOETIN ALFA 100 MCG/0.5ML IJ SOSY
100.0000 ug | PREFILLED_SYRINGE | INTRAMUSCULAR | Status: DC
  Administered 2022-12-25: 100 ug via SUBCUTANEOUS
  Filled 2022-12-25 (×2): qty 0.5

## 2022-12-25 MED ORDER — ALTEPLASE 2 MG IJ SOLR: 2.0000 mg | Freq: Once | INTRAMUSCULAR | Status: DC | PRN

## 2022-12-25 MED ORDER — HEPARIN SODIUM (PORCINE) 1000 UNIT/ML DIALYSIS: 1000.0000 [IU] | INTRAMUSCULAR | Status: DC | PRN

## 2022-12-25 MED ORDER — ANTICOAGULANT SODIUM CITRATE 4% (200MG/5ML) IV SOLN: 5.0000 mL | Status: DC | PRN

## 2022-12-25 MED ORDER — PENTAFLUOROPROP-TETRAFLUOROETH EX AERO: 1.0000 | INHALATION_SPRAY | CUTANEOUS | Status: DC | PRN

## 2022-12-25 MED ORDER — LIDOCAINE HCL (PF) 1 % IJ SOLN: 5.0000 mL | INTRAMUSCULAR | Status: DC | PRN

## 2022-12-25 MED ORDER — GUAIFENESIN ER 600 MG PO TB12
600.0000 mg | ORAL_TABLET | Freq: Two times a day (BID) | ORAL | Status: DC | PRN
  Filled 2022-12-25: qty 1

## 2022-12-25 NOTE — Assessment & Plan Note (Signed)
-   no s/s exac

## 2022-12-25 NOTE — Assessment & Plan Note (Signed)
-   on HD MWF - continue as per nephrology

## 2022-12-25 NOTE — Assessment & Plan Note (Addendum)
-   Not on anticoagulation at home -Given episode of A-fib with RVR, resuming Toprol and will monitor blood pressure for tolerance -Continue amiodarone

## 2022-12-25 NOTE — Assessment & Plan Note (Addendum)
-   no purulent drainage; some bruising noted - s/p rocephin x 7 days; seems to be improved from admission

## 2022-12-25 NOTE — Progress Notes (Signed)
Progress Note    Kim Burns   ZOX:[id number]  DOB: Oct 08, 1935  DOA: 12/23/2022     1 PCP: Lewis Moccasin, MD  Initial CC: hypotension with HD and LLE cellulitis   Hospital Course: Kim Burns is an 87 y.o. Female with ESRD on HD MWF, DM, RCC s/p ablation, IPMN s/p Whipple, PAF not on AC, dCHF, hx CVA and OSA not on CPAP who presented with worsening hypotension with dialysis and left leg cellulitis.  Interval History:  No events overnight.  Pressure actually improved for the first time today.  She was seen in her room after returning from dialysis today.   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) - continue SSI and CBG monitoring   Chronic diastolic CHF (congestive heart failure) (HCC) - no s/s exac  Exocrine pancreatic insufficiency - continue creon     Old records reviewed in assessment of this patient  Antimicrobials: Cefepime 11/5 x 1 Rocephin 11/5 >> current   DVT prophylaxis:  Place and maintain sequential compression device Start: 12/24/22 1624   Code Status:   Code Status: Full Code  Mobility Assessment (Last 72 Hours)     Mobility Assessment     Row Name 12/25/22 1501 12/24/22 2100 12/24/22 0922       Does patient have an order for bedrest or is patient medically unstable -- No - Continue assessment No - Continue assessment     What is the highest level of  mobility based on the progressive mobility assessment? Level 5 (Walks with assist in room/hall) - Balance while stepping forward/back and can walk in room with assist - Complete Level 5 (Walks with assist in room/hall) - Balance while stepping forward/back and can walk in room with assist - Complete Level 5 (Walks with assist in room/hall) - Balance while stepping forward/back and can walk in room with assist - Complete              Barriers to discharge: none Disposition Plan:  Pending PT/OT evals Status is: Inpt  Objective: Blood pressure (!) 151/67, pulse 67, temperature 98.5 F (36.9 C), temperature source Oral, resp. rate 17, height 5\' 3"  (1.6 m), weight 56 kg, SpO2 100%.  Examination:  Physical Exam Constitutional:      Appearance: Normal appearance.  HENT:     Head: Normocephalic and atraumatic.     Mouth/Throat:     Mouth: Mucous membranes are moist.  Eyes:     Extraocular Movements: Extraocular movements intact.  Cardiovascular:     Rate and Rhythm: Normal rate and regular rhythm.  Pulmonary:     Effort: Pulmonary effort is normal. No respiratory distress.     Breath sounds: Normal breath sounds. No wheezing.  Abdominal:     General: Bowel sounds are normal. There is no distension.     Palpations: Abdomen is soft.     Tenderness: There is no abdominal tenderness.  Musculoskeletal:     Cervical back: Normal range of motion and neck supple.     Comments: Some chronic erythema and skin changes noted in RLE but worse in LLE with blood blister appreciated and surrounding erythema and mild calor (area also TTP).  Skin:    General: Skin is warm and dry.  Neurological:     General: No focal deficit present.     Mental Status: She is alert.  Psychiatric:        Mood and Affect: Mood normal.      Consultants:  Nephrology   Procedures:    Data Reviewed: Results for orders placed or performed during the hospital encounter of 12/23/22 (from the past 24 hour(s))   Glucose, capillary     Status: Abnormal   Collection Time: 12/24/22  8:02 PM  Result Value Ref Range   Glucose-Capillary 184 (H) 70 - 99 mg/dL  Comprehensive metabolic panel     Status: Abnormal   Collection Time: 12/25/22  5:38 AM  Result Value Ref Range   Sodium 137 135 - 145 mmol/L   Potassium 4.0 3.5 - 5.1 mmol/L   Chloride 98 98 - 111 mmol/L   CO2 23 22 - 32 mmol/L   Glucose, Bld 142 (H) 70 - 99 mg/dL   BUN 37 (H) 8 - 23 mg/dL   Creatinine, Ser 0.98 (H) 0.44 - 1.00 mg/dL   Calcium 7.9 (L) 8.9 - 10.3 mg/dL   Total Protein 4.1 (L) 6.5 - 8.1 g/dL   Albumin 1.6 (L) 3.5 - 5.0 g/dL   AST 22 15 - 41 U/L   ALT 63 (H) 0 - 44 U/L   Alkaline Phosphatase 103 38 - 126 U/L   Total Bilirubin 0.6 <1.2 mg/dL   GFR, Estimated 11 (L) >60 mL/min   Anion gap 16 (H) 5 - 15  CBC     Status: Abnormal   Collection Time: 12/25/22  5:38 AM  Result Value Ref Range   WBC 8.9 4.0 - 10.5 K/uL   RBC 2.33 (L) 3.87 - 5.11 MIL/uL   Hemoglobin 7.3 (L) 12.0 - 15.0 g/dL   HCT 11.9 (L) 14.7 - 82.9 %   MCV 102.1 (H) 80.0 - 100.0 fL   MCH 31.3 26.0 - 34.0 pg   MCHC 30.7 30.0 - 36.0 g/dL   RDW 56.2 (H) 13.0 - 86.5 %   Platelets 161 150 - 400 K/uL   nRBC 0.0 0.0 - 0.2 %  Glucose, capillary     Status: Abnormal   Collection Time: 12/25/22  7:14 AM  Result Value Ref Range   Glucose-Capillary 115 (H) 70 - 99 mg/dL  Glucose, capillary     Status: None   Collection Time: 12/25/22  2:23 PM  Result Value Ref Range   Glucose-Capillary 74 70 - 99 mg/dL  Glucose, capillary     Status: Abnormal   Collection Time: 12/25/22  4:30 PM  Result Value Ref Range   Glucose-Capillary 103 (H) 70 - 99 mg/dL    I have reviewed pertinent nursing notes, vitals, labs, and images as necessary. I have ordered labwork to follow up on as indicated.  I have reviewed the last notes from staff over past 24 hours. I have discussed patient's care plan and test results with nursing staff, CM/SW, and other staff as appropriate.  Time  spent: Greater than 50% of the 55 minute visit was spent in counseling/coordination of care for the patient as laid out in the A&P.  LOS: 1 day   Lewie Chamber, MD [facility] 12/25/2022, 5:23 PM

## 2022-12-25 NOTE — Assessment & Plan Note (Signed)
-   continue SSI and CBG monitoring  

## 2022-12-25 NOTE — Progress Notes (Signed)
PT Cancellation Note  Patient Details Name: SHARLEE RUFINO MRN: [id number] DOB: [birth year]   Cancelled Treatment:    Reason Eval/Treat Not Completed: Patient at procedure or test/unavailable  Currently in HD;  Will follow up later today as time allows;  Otherwise, will follow up for PT tomorrow;   Thank you,  Van Clines, PT  Acute Rehabilitation Services Office [phone]    Levi Aland 12/25/2022, 10:12 AM

## 2022-12-25 NOTE — Assessment & Plan Note (Addendum)
-   Hepatic steatosis noted on RUQ ultrasound; hx of CCY - Trend LFTs intermittently

## 2022-12-25 NOTE — Progress Notes (Signed)
[facility] Progress Note   Subjective:   Patient reports leg edema and pain are improved this AM. Also reports her nephrostomy tube is painful today, reports it is changed every 6 weeks and was last changed last week. Denies fever, chills, SOB, CP.   Objective Vitals:   12/24/22 0807 12/24/22 1628 12/24/22 2001 12/25/22 0505  BP: (!) 153/70 (!) 158/71 (!) 150/75 (!) 161/64  Pulse: 61 64 66 66  Resp: 18 18 18 18   Temp: 98 F (36.7 C) 98.3 F (36.8 C) 98.6 F (37 C) 98.2 F (36.8 C)  TempSrc:  Oral Oral Oral  SpO2: 100% (!) 80% 100% 100%  Weight: 55.4 kg     Height: 5\' 3"  (1.6 m)      Physical Exam General: Alert female in NAD Heart: RRR, no murmurs, rubs or gallops Lungs: CTA bilaterally Abdomen: Soft, non-distended, +BS GU: Nephrostomy tube intact with no drainage/redness at exit site Extremities: 2+ edema b/l lower extremities. L leg erythematous, warm, + large blister on skin  Dialysis Access: AVF + t/b  Additional Objective Labs: Basic Metabolic Panel: Recent Labs  Lab 12/23/22 1916 12/24/22 0445 12/25/22 0538  NA 141 142 137  K 4.0 3.8 4.0  CL 100 103 98  CO2 29 27 23   GLUCOSE 174* 94 142*  BUN 17 21 37*  CREATININE 2.99* 3.21* 3.75*  CALCIUM 7.5* 7.3* 7.9*   Liver Function Tests: Recent Labs  Lab 12/23/22 1916 12/24/22 0445 12/25/22 0538  AST 63* 52* 22  ALT 104* 90* 63*  ALKPHOS 128* 112 103  BILITOT 0.6 0.6 0.6  PROT 4.6* 4.2* 4.1*  ALBUMIN 1.9* 1.7* 1.6*   No results for input(s): "LIPASE", "AMYLASE" in the last 168 hours. CBC: Recent Labs  Lab 12/23/22 1916 12/24/22 0445 12/25/22 0538  WBC 9.1 9.3 8.9  NEUTROABS 7.5  --   --   HGB 8.6* 7.8* 7.3*  HCT 28.0* 25.4* 23.8*  MCV 104.1* 105.0* 102.1*  PLT 178 161 161   Blood Culture    Component Value Date/Time   SDES BLOOD LEFT ARM 12/23/2022 2331   SDES BLOOD RIGHT ARM 12/23/2022 2331   SPECREQUEST  12/23/2022 2331    BOTTLES DRAWN AEROBIC AND ANAEROBIC Blood  Culture adequate volume   SPECREQUEST  12/23/2022 2331    BOTTLES DRAWN AEROBIC AND ANAEROBIC Blood Culture adequate volume   CULT  12/23/2022 2331    NO GROWTH 1 DAY Performed at [facility], [address], [place], [place] [address]    CULT  12/23/2022 2331    NO GROWTH 1 DAY Performed at [facility], [address], [place], [place] [address]    REPTSTATUS PENDING 12/23/2022 2331   REPTSTATUS PENDING 12/23/2022 2331    Cardiac Enzymes: No results for input(s): "CKTOTAL", "CKMB", "CKMBINDEX", "TROPONINI" in the last 168 hours. CBG: Recent Labs  Lab 12/24/22 0946 12/24/22 1129 12/24/22 1627 12/24/22 2002 12/25/22 0714  GLUCAP 80 109* 181* 184* 115*   Iron Studies:  Recent Labs    12/24/22 0445  IRON 66  TIBC NOT CALCULATED  FERRITIN 1,405*   @lablastinr3 @ Studies/Results: US Abdomen Limited RUQ (LIVER/GB)  Result Date: 12/24/2022 CLINICAL DATA:  Elevated LFTs. EXAM: ULTRASOUND ABDOMEN LIMITED RIGHT UPPER QUADRANT COMPARISON:  CT scan 06/20/2022.  Abdominal ultrasound 05/17/2020 FINDINGS: Gallbladder: Surgically absent. Common bile duct: Diameter: 6 mm Liver: Diffusely increased echogenicity of liver parenchyma without a discrete or focal abnormality. Portal vein is patent on color Doppler imaging with  normal direction of blood flow towards the liver. Other: Cystic change noted right kidney. IMPRESSION: 1. Diffusely increased echogenicity of liver parenchyma without a discrete or focal abnormality. Imaging features compatible with hepatic steatosis. 2. Status post cholecystectomy. 3. Cystic changes noted in the right kidney, better characterized on previous CT imaging. Electronically Signed   By: Kennith Center M.D.   On: 12/24/2022 06:20   DG Chest 2 View  Result Date: 12/24/2022 CLINICAL DATA:  Lactic acidosis EXAM: CHEST - 2 VIEW COMPARISON:  10/21/2022 FINDINGS: Lungs are well expanded, symmetric, and clear. No pneumothorax or pleural effusion. Cardiac  size within normal limits. Pulmonary vascularity is normal. Osseous structures are age-appropriate. No acute bone abnormality. IMPRESSION: No active cardiopulmonary disease. Electronically Signed   By: Helyn Numbers M.D.   On: 12/24/2022 00:05   Medications:  albumin human     anticoagulant sodium citrate     cefTRIAXone (ROCEPHIN)  IV 2 g (12/24/22 0941)    amiodarone  200 mg Oral Daily   aspirin  81 mg Oral Daily   Chlorhexidine Gluconate Cloth  6 each Topical Q0600   doxercalciferol  6 mcg Intravenous Q M,W,F-HD   feeding supplement  237 mL Oral BID BM   insulin aspart  0-5 Units Subcutaneous QHS   insulin aspart  0-6 Units Subcutaneous TID WC   lipase/protease/amylase  72,000 Units Oral TID WC   midodrine  10 mg Oral Q M,W,F   rOPINIRole  0.25 mg Oral q morning   And   rOPINIRole  0.5 mg Oral QHS   sevelamer carbonate  800 mg Oral TID WC   sodium chloride flush  3 mL Intravenous Q12H    Dialysis Orders: [facility]: [facility]  on MWF . 160NRe 3 hr 30 min BFR 350 DFR Auto 1.5 EDW 55kg 2K 2Ca AVF 16g  No heparin Mircera IV q 2 weeks- has not received ESA recently  Hectorol IV q HD  Assessment/Plan:  Lower leg cellulitis: On antibiotics per admitting team  ESRD:  On MWF schedule, next HD today  Hypertension/volume: BP has been dropping on HD despite midodrine. Takes 10mg  pre-HD and occasionally an extra dose mid HD. Will continue midodrine and order PRN albumin with HD. May need to increase her midodrine dose.   Anemia: Hgb 7.3, does not appear she has received ESA since last admission, ordered for today  Metabolic bone disease: Corrected calcium controlled, will check phos. Continue VDRA and binders  Nutrition:  Alb 1.6. will need renal diet and protein supplements Recent TBI/CIR admission: improved but reports she is still weak. Gets PT at home. Holding heparin with HD.    Nephrostomy tube: Exchanged last week but painful today, if no improvement may need urology  consult  Rogers Blocker, PA-C 12/25/2022, 9:00 AM  [facility] Pager: [phone]

## 2022-12-25 NOTE — Assessment & Plan Note (Signed)
-  continue creon

## 2022-12-25 NOTE — Assessment & Plan Note (Signed)
-   On midodrine on dialysis days and pressures are improved

## 2022-12-25 NOTE — Assessment & Plan Note (Addendum)
-   s/p albumin

## 2022-12-25 NOTE — Progress Notes (Signed)
OT Cancellation Note  Patient Details Name: PACHIA STRUM MRN: [id number] DOB: Mar 30, 1935   Cancelled Treatment:    Reason Eval/Treat Not Completed: (P) Patient at procedure or test/ unavailable, will reattempt later as able  Alexis Goodell 12/25/2022, 1:15 PM

## 2022-12-25 NOTE — Evaluation (Signed)
Occupational Therapy Evaluation Patient Details Name: Kim Burns MRN: [id number] DOB: 03/08/1935 Today's Date: 12/25/2022   History of Present Illness 87 y.o. F who presented with worsening hypotension with dialysis and left leg cellulitis; notable recent admissions, 08/2022 aFib with RVR, early Sept 2024 (found down) with R frontal contusion, skull fxs, developed subdural hematomas, L pubic ramus fx (WBAT), small non-displaced S4 fx, ischemic CVAs; Underwent rehab at [facility] and dc'd to home on 9/28; with ESRD on HD MWF, DM, RCC s/p ablation, IPMN s/p Whipple, pAF not on AC, dCHF, hx CVA and OSA not on CPAP   Clinical Impression   Pt c/o minimal pain to LLE, 2/10, no increase in pain with activities. Pt lightheaded 2X during ambulation, quickly improves with rest break. Pt live at home with son who is available 24/7, helps with STSs and IADLs at baseline. Pt currently close to baseline, supervision/set up for ADLs, min A to power STS, able to ambulate around room and short distance in hallway before returning to bed. Pt demonstrates decreased activity tolerance and BLE weakness, would benefit from HHOT follow up. Pt has all DME needed to remain safe/independent in home setting. Pt to be seen acutely to progress as able.        If plan is discharge home, recommend the following: A little help with walking and/or transfers;A little help with bathing/dressing/bathroom;Assistance with cooking/housework;Assist for transportation;Help with stairs or ramp for entrance    Functional Status Assessment  Patient has had a recent decline in their functional status and demonstrates the ability to make significant improvements in function in a reasonable and predictable amount of time.  Equipment Recommendations  None recommended by OT    Recommendations for Other Services       Precautions / Restrictions Precautions Precautions: Fall      Mobility Bed Mobility Overal bed mobility: Needs  Assistance Bed Mobility: Supine to Sit, Sit to Supine     Supine to sit: Contact guard, HOB elevated, Used rails Sit to supine: Min assist, Used rails   General bed mobility comments: CGA to min A for in/out of bed due to BLE weakness.    Transfers Overall transfer level: Needs assistance Equipment used: Rolling walker (2 wheels) Transfers: Bed to chair/wheelchair/BSC, Sit to/from Stand Sit to Stand: Min assist     Step pivot transfers: Supervision     General transfer comment: min A to power STS, supervision for ambulation with RW around room      Balance Overall balance assessment: Needs assistance Sitting-balance support: No upper extremity supported, Feet supported Sitting balance-Leahy Scale: Fair Sitting balance - Comments: EOB ADLs   Standing balance support: Bilateral upper extremity supported, During functional activity Standing balance-Leahy Scale: Fair Standing balance comment: no LOB, able to stand with one hand support by RW to open doors                           ADL either performed or assessed with clinical judgement   ADL Overall ADL's : Needs assistance/impaired Eating/Feeding: Independent   Grooming: Supervision/safety;Standing   Upper Body Bathing: Set up;Sitting   Lower Body Bathing: Set up;Sitting/lateral leans   Upper Body Dressing : Set up;Sitting   Lower Body Dressing: Set up;Sit to/from stand   Toilet Transfer: Minimal assistance;Rolling walker (2 wheels);Comfort height toilet;Ambulation   Toileting- Clothing Manipulation and Hygiene: Modified independent       Functional mobility during ADLs: Supervision/safety;Rolling walker (2 wheels) General ADL  Comments: Pt set up/supervision for ADLs, min A for STS, able to ambulate with RW supervision.     Vision Baseline Vision/History: 1 Wears glasses Ability to See in Adequate Light: 0 Adequate Patient Visual Report: No change from baseline       Perception          Praxis         Pertinent Vitals/Pain Pain Assessment Pain Assessment: 0-10 Pain Score: 2  Pain Location: LLE Pain Descriptors / Indicators: Aching, Discomfort Pain Intervention(s): Monitored during session     Extremity/Trunk Assessment             Communication Communication Communication: Hearing impairment   Cognition Arousal: Alert Behavior During Therapy: WFL for tasks assessed/performed Overall Cognitive Status: Within Functional Limits for tasks assessed                                 General Comments: pleasant, conversational     General Comments       Exercises     Shoulder Instructions      Home Living Family/patient expects to be discharged to:: Private residence Living Arrangements: Children Available Help at Discharge: Family;Available 24 hours/day Type of Home: House Home Access: Ramped entrance   Entrance Stairs-Rails: Left;Right Home Layout: Able to live on main level with bedroom/bathroom;Laundry or work area in basement     [employer] Shower/Tub: [employer] Equipment: Agricultural consultant (2 wheels);Shower seat;Rollator (4 wheels);Wheelchair - manual;Cane - single point   Additional Comments: Pt lives with son, daughter also stays at times. Son is available 24/7      Prior Functioning/Environment Prior Level of Function : Independent/Modified Independent             Mobility Comments: uses rollator ADLs Comments: mod I        OT Problem List: Decreased strength;Decreased activity tolerance;Impaired balance (sitting and/or standing);Pain;Increased edema      OT Treatment/Interventions: Self-care/ADL training;Therapeutic exercise;Energy conservation;Therapeutic activities;Balance training    OT Goals(Current goals can be found in the care plan section) Acute Rehab OT Goals Patient Stated Goal: to return home, improve BLE strength OT Goal Formulation: With patient Time For Goal Achievement:  01/08/23 Potential to Achieve Goals: Good  OT Frequency: Min 1X/week    Co-evaluation              AM-PAC OT "6 Clicks" Daily Activity     Outcome Measure Help from another person eating meals?: None Help from another person taking care of personal grooming?: A Little Help from another person toileting, which includes using toliet, bedpan, or urinal?: A Little Help from another person bathing (including washing, rinsing, drying)?: A Little Help from another person to put on and taking off regular upper body clothing?: A Little Help from another person to put on and taking off regular lower body clothing?: A Little 6 Click Score: 19   End of Session Equipment Utilized During Treatment: Gait belt;Rolling walker (2 wheels) Nurse Communication: Mobility status  Activity Tolerance: Patient tolerated treatment well Patient left: in bed;with call bell/phone within reach  OT Visit Diagnosis: Unsteadiness on feet (R26.81);Other abnormalities of gait and mobility (R26.89);Muscle weakness (generalized) (M62.81);History of falling (Z91.81);Pain Pain - Right/Left: Left Pain - part of body: Ankle and joints of foot                Time: 1458-1531 OT Time Calculation (min):  33 min Charges:  OT General Charges $OT Visit: 1 Visit OT Evaluation $OT Eval Low Complexity: 1 Low OT Treatments $Self Care/Home Management : 8-22 mins  [place], OTR/L   Alexis Goodell 12/25/2022, 3:36 PM

## 2022-12-25 NOTE — Procedures (Signed)
Patient seen on Hemodialysis. BP (!) 173/90   Pulse 66   Temp 98.3 F (36.8 C) (Oral)   Resp 18   Ht 5\' 3"  (1.6 m)   Wt 55.4 kg   SpO2 100%   BMI 21.64 kg/m   QB 400, UF goal 2.5L Tolerating treatment without complaints at this time.   Zetta Bills MD [facility] Office # [phone] Pager # [phone] 9:58 AM

## 2022-12-25 NOTE — Progress Notes (Signed)
Received patient in bed.Awake,alert and oriented x 4.Consent verified.  Access used: Left upper arm AVF that worked well.  Medicine given : Midodrine 10 mg.                             Albumin 25 g.                            Hectorol 6 mcg.  Duration of treatment: 3.5 hours.  UF goal : Met uf  goal of  2,500  Hemo comment: Tolerated treatment.  Hand off to the pastient nurse: Basck into her room with stable medical condition via transporter.

## 2022-12-25 NOTE — Progress Notes (Addendum)
Brief Palliative Medicine Progress Note:   PMT consult received and chart reviewed.    Patient unavailable - in dialysis. PMT will attempt to see later as able or tomorrow 11/7 for full GOC.   Thank you for allowing PMT to assist in the care of this patient.   Danyeal Akens M. Katrinka Blazing [facility] Palliative Medicine Team Team Phone: [phone] NO CHARGE

## 2022-12-25 NOTE — Plan of Care (Signed)

## 2022-12-26 ENCOUNTER — Inpatient Hospital Stay (HOSPITAL_COMMUNITY): Payer: Medicare PPO

## 2022-12-26 DIAGNOSIS — Z7189 Other specified counseling: Secondary | ICD-10-CM

## 2022-12-26 DIAGNOSIS — N99528 Other complication of other external stoma of urinary tract: Secondary | ICD-10-CM | POA: Diagnosis not present

## 2022-12-26 DIAGNOSIS — G20A1 Parkinson's disease without dyskinesia, without mention of fluctuations: Secondary | ICD-10-CM | POA: Insufficient documentation

## 2022-12-26 DIAGNOSIS — L03116 Cellulitis of left lower limb: Secondary | ICD-10-CM | POA: Diagnosis not present

## 2022-12-26 DIAGNOSIS — R638 Other symptoms and signs concerning food and fluid intake: Secondary | ICD-10-CM

## 2022-12-26 DIAGNOSIS — Z711 Person with feared health complaint in whom no diagnosis is made: Secondary | ICD-10-CM

## 2022-12-26 DIAGNOSIS — Z789 Other specified health status: Secondary | ICD-10-CM

## 2022-12-26 DIAGNOSIS — I959 Hypotension, unspecified: Secondary | ICD-10-CM | POA: Diagnosis not present

## 2022-12-26 LAB — COMPREHENSIVE METABOLIC PANEL
ALT: 44 U/L (ref 0–44)
AST: 16 U/L (ref 15–41)
Albumin: 1.9 g/dL — ABNORMAL LOW (ref 3.5–5.0)
Alkaline Phosphatase: 82 U/L (ref 38–126)
Anion gap: 12 (ref 5–15)
BUN: 30 mg/dL — ABNORMAL HIGH (ref 8–23)
CO2: 23 mmol/L (ref 22–32)
Calcium: 8.3 mg/dL — ABNORMAL LOW (ref 8.9–10.3)
Chloride: 101 mmol/L (ref 98–111)
Creatinine, Ser: 3.19 mg/dL — ABNORMAL HIGH (ref 0.44–1.00)
GFR, Estimated: 14 mL/min — ABNORMAL LOW (ref >60)
Glucose, Bld: 96 mg/dL (ref 70–99)
Potassium: 4 mmol/L (ref 3.5–5.1)
Sodium: 136 mmol/L (ref 135–145)
Total Bilirubin: 0.6 mg/dL (ref <1.2)
Total Protein: 4.2 g/dL — ABNORMAL LOW (ref 6.5–8.1)

## 2022-12-26 LAB — CBC
HCT: 22.8 % — ABNORMAL LOW (ref 36.0–46.0)
Hemoglobin: 7.1 g/dL — ABNORMAL LOW (ref 12.0–15.0)
MCH: 31.3 pg (ref 26.0–34.0)
MCHC: 31.1 g/dL (ref 30.0–36.0)
MCV: 100.4 fL — ABNORMAL HIGH (ref 80.0–100.0)
Platelets: 155 10*3/uL (ref 150–400)
RBC: 2.27 MIL/uL — ABNORMAL LOW (ref 3.87–5.11)
RDW: 15.6 % — ABNORMAL HIGH (ref 11.5–15.5)
WBC: 10.3 10*3/uL (ref 4.0–10.5)
nRBC: 0 % (ref 0.0–0.2)

## 2022-12-26 LAB — GLUCOSE, CAPILLARY
Glucose-Capillary: 87 mg/dL (ref 70–99)
Glucose-Capillary: 143 mg/dL — ABNORMAL HIGH (ref 70–99)
Glucose-Capillary: 175 mg/dL — ABNORMAL HIGH (ref 70–99)
Glucose-Capillary: 189 mg/dL — ABNORMAL HIGH (ref 70–99)

## 2022-12-26 LAB — MAGNESIUM: Magnesium: 2.2 mg/dL (ref 1.7–2.4)

## 2022-12-26 MED ORDER — ACETAMINOPHEN 325 MG PO TABS
650.0000 mg | ORAL_TABLET | ORAL | Status: DC | PRN
  Administered 2022-12-26 – 2022-12-31 (×7): 650 mg via ORAL
  Filled 2022-12-26 (×7): qty 2

## 2022-12-26 MED ORDER — AMANTADINE HCL 100 MG PO CAPS
100.0000 mg | ORAL_CAPSULE | ORAL | Status: DC
  Administered 2022-12-26: 100 mg via ORAL
  Filled 2022-12-26 (×2): qty 1

## 2022-12-26 NOTE — Plan of Care (Signed)

## 2022-12-26 NOTE — Evaluation (Signed)
Physical Therapy Evaluation Patient Details Name: Kim Burns MRN: [id number] DOB: 1935-04-10 Today's Date: 12/26/2022  History of Present Illness  87 y.o. F who presented 11/4 with worsening hypotension with dialysis and left leg cellulitis; notable recent admissions, 08/2022 aFib with RVR, early Sept 2024 (found down) with R frontal contusion, skull fxs, developed subdural hematomas, L pubic ramus fx (WBAT), small non-displaced S4 fx, ischemic CVAs; Underwent rehab at [facility] and dc'd to home on 9/28; with ESRD on HD MWF, DM, RCC s/p ablation, IPMN s/p Whipple, pAF not on AC, dCHF, hx CVA and OSA not on CPAP.  Clinical Impression  Pt admitted with above diagnosis. Lives with son, 24/7 support available from family at d/c. Required min assist for transfer, and was able to ambulate 100 feet at a supervision level with RW for support. Of note, pt complains of dizziness (vertigo) with changes in head position; describes a prior diagnosis of BPPV but would not allow follow-up treatment after feeling dizzy with initial test/treatment. The symptoms have not resolved and she has fallen in the past, likely due to these symptoms. She is open to further assessment and treatment either here or with HHPT. Will attempt to follow-up tomorrow but have made recommendation for HHPT follow-up, ideally with a vestibular trained therapist. Pt in agreement. Pt currently with functional limitations due to the deficits listed below (see PT Problem List). Pt will benefit from acute skilled PT to increase their independence and safety with mobility to allow discharge.           If plan is discharge home, recommend the following: A little help with walking and/or transfers;A little help with bathing/dressing/bathroom;Assistance with cooking/housework;Assist for transportation;Help with stairs or ramp for entrance   Can travel by private vehicle        Equipment Recommendations None recommended by PT  Recommendations for  Other Services       Functional Status Assessment Patient has had a recent decline in their functional status and demonstrates the ability to make significant improvements in function in a reasonable and predictable amount of time.     Precautions / Restrictions Precautions Precautions: Fall Restrictions Weight Bearing Restrictions: No      Mobility  Bed Mobility Overal bed mobility: Needs Assistance Bed Mobility: Supine to Sit     Supine to sit: Contact guard, HOB elevated, Used rails     General bed mobility comments: CGA for safety, pt pulls through therapist hand to rise to EOB. States son can help    Transfers Overall transfer level: Needs assistance Equipment used: Rolling walker (2 wheels) Transfers: Sit to/from Stand Sit to Stand: Min assist           General transfer comment: Min assist for boost to stand from EOB. Good control with descent into recliner. VC for hand placement and forward translation to rise.RW to steady upon standing.    Ambulation/Gait Ambulation/Gait assistance: Supervision Gait Distance (Feet): 100 Feet Assistive device: Rolling walker (2 wheels) Gait Pattern/deviations: Step-through pattern, Decreased stride length Gait velocity: dec Gait velocity interpretation: <1.8 ft/sec, indicate of risk for recurrent falls   General Gait Details: Decreased pace but stable with use of RW for support. Cues for larger step length. No LOB or buckling noted. Pt fatigued by end of distance, does report feeling closer to baseline with a little LE soreness.  Stairs            Wheelchair Mobility     Tilt Bed    Modified Rankin (  Stroke Patients Only)       Balance Overall balance assessment: Needs assistance Sitting-balance support: No upper extremity supported, Feet supported Sitting balance-Leahy Scale: Fair     Standing balance support: No upper extremity supported Standing balance-Leahy Scale: Fair Standing balance comment: More  stable with RW                             Pertinent Vitals/Pain Pain Assessment Pain Assessment: Faces Faces Pain Scale: Hurts a little bit Pain Location: LLE Pain Descriptors / Indicators: Discomfort Pain Intervention(s): Monitored during session, Repositioned    Home Living Family/patient expects to be discharged to:: Private residence Living Arrangements: Children Available Help at Discharge: Family;Available 24 hours/day Type of Home: House Home Access: Ramped entrance Entrance Stairs-Rails: Left;Right     Home Layout: Able to live on main level with bedroom/bathroom;Laundry or work area in [employer] Equipment: Agricultural consultant (2 wheels);Shower seat;Rollator (4 wheels);Wheelchair - manual;Cane - single point Additional Comments: Pt lives with son, daughter also stays at times. Son is available 24/7    Prior Function Prior Level of Function : Independent/Modified Independent             Mobility Comments: uses rollator ADLs Comments: mod I     Extremity/Trunk Assessment   Upper Extremity Assessment Upper Extremity Assessment: Defer to OT evaluation    Lower Extremity Assessment Lower Extremity Assessment: Generalized weakness (blood blister Lt shin)       Communication   Communication Communication: Hearing impairment  Cognition Arousal: Alert Behavior During Therapy: WFL for tasks assessed/performed Overall Cognitive Status: Within Functional Limits for tasks assessed                                          General Comments General comments (skin integrity, edema, etc.): Reports hx of BPPV that was treated x1 in CIR but she refused additional repositioning maneuvers due to filling dizzy. Extensive education provided regarding tx and pt agreeable to follow-up assessment and tx for this problem. She does get dizzy with positional changes from supine to sit and with bending over. Lasts less than 1 min and is described as the  room spinning. Consistent subjective history with BPPV, recommend follow-up assessment and Tx, either in hospital or with HHPT follow-up. Pt agrees.    Exercises General Exercises - Lower Extremity Ankle Circles/Pumps: AROM, Both, 10 reps, Seated Quad Sets: Strengthening, Both, 10 reps, Seated Gluteal Sets: Strengthening, Both, 10 reps, Seated Long Arc Quad: Strengthening, Both, 10 reps, Seated   Assessment/Plan    PT Assessment Patient needs continued PT services  PT Problem List Decreased strength;Decreased activity tolerance;Decreased balance;Decreased mobility;Decreased knowledge of use of DME;Pain;Decreased skin integrity       PT Treatment Interventions DME instruction;Gait training;Functional mobility training;Therapeutic activities;Therapeutic exercise;Balance training;Neuromuscular re-education;Patient/family education;Canalith reposition    PT Goals (Current goals can be found in the Care Plan section)  Acute Rehab PT Goals Patient Stated Goal: Get well PT Goal Formulation: With patient Time For Goal Achievement: 01/09/23 Potential to Achieve Goals: Good    Frequency Min 1X/week     Co-evaluation               AM-PAC PT "6 Clicks" Mobility  Outcome Measure Help needed turning from your back to your side while in a flat bed without using bedrails?: A Little Help needed  moving from lying on your back to sitting on the side of a flat bed without using bedrails?: A Little Help needed moving to and from a bed to a chair (including a wheelchair)?: A Little Help needed standing up from a chair using your arms (e.g., wheelchair or bedside chair)?: A Little Help needed to walk in hospital room?: A Little Help needed climbing 3-5 steps with a railing? : A Lot 6 Click Score: 17    End of Session Equipment Utilized During Treatment: Gait belt Activity Tolerance: Patient tolerated treatment well Patient left: in chair;with call bell/phone within reach;with chair alarm  set;with SCD's reapplied (LT SCD below blood blister)   PT Visit Diagnosis: Unsteadiness on feet (R26.81);Other abnormalities of gait and mobility (R26.89);Muscle weakness (generalized) (M62.81);History of falling (Z91.81);Pain;Dizziness and giddiness (R42);BPPV Pain - part of body:  (BIL LEs)    Time: 1610-9604 PT Time Calculation (min) (ACUTE ONLY): 26 min   Charges:   PT Evaluation $PT Eval Low Complexity: 1 Low PT Treatments $Gait Training: 8-22 mins PT General Charges $$ ACUTE PT VISIT: 1 Visit         Kathlyn Sacramento, PT, DPT [facility]  Rehabilitation Services Physical Therapist Office: [phone] Website: [email or web address]   Berton Mount 12/26/2022, 2:40 PM

## 2022-12-26 NOTE — Consult Note (Signed)
Consultation Note Date: 12/26/2022   Patient Name: Kim Burns  DOB: 04-Aug-1935  MRN: [id number]  Age / Sex: 87 y.o., female  PCP: Lewis Moccasin, MD Referring Physician: Lewie Chamber, MD  Reason for Consultation: Establishing goals of care  HPI/Patient Profile: 87 y.o. female  with past medical history of IPMN of pancreas status post Whipple procedure, insulin-dependent diabetes mellitus, RCC status post ablation, ESRD on hemodialysis, PAF not anticoagulated, HFmrEF, history of CVA, and OSA with CPAP intolerance presented for evaluation of worsening lower extremity swelling and redness. Patient was admitted on 12/23/2022 with LLE cellulitis, elevated LFTs, malnutrition.   Of note, patient has had 4 admissions in the last 6 months. Most recent was 9/11-9/28/24 ago for traumatic brain injury and CIR admit.  Clinical Assessment and Goals of Care: I have reviewed medical records including EPIC notes, labs, and imaging. Received report from primary RN - no acute concerns.   Went to visit patient at bedside - no family/visitors present. Patient was lying in bed awake, alert, oriented, and able to participate in conversation. No signs or non-verbal gestures of pain or discomfort noted. No respiratory distress, increased work of breathing, or secretions noted. She has taken minimal bites of her lunch and indicates she "just can't eat it."   Met with patient in person and her son/[name] via speakerphone  to discuss diagnosis, prognosis, GOC, EOL wishes, disposition, and options.  I introduced Palliative Medicine as specialized medical care for people living with serious illness. It focuses on providing relief from the symptoms and stress of a serious illness. The goal is to improve quality of life for both the patient and the family.  We discussed a brief life review of the patient as well as functional and  nutritional status. Patient is widowed - she has two children; one son that live locally and a daughter in [place]. Prior to hospitalization, patient was living in a private residence. Her son and daughter alternate living with her providing almost 24/7 care. Patient has no HH services. She is able to ambulate with a walker and dress/bathe herself with minimal assistance. Patient reports her appetite at home is not good. She reports decreased appetite, no nausea. Albumin noted at 1.6 on admission. Patient has been on HD since around January 2023.   We discussed patient's current illness and what it means in the larger context of patient's on-going co-morbidities. Patient understands that HF and ESRD are chronic diseases underlying her current acute medical conditions. Patient and her son have a clear understanding of her acute medical situation. We discussed concern about her malnutrition in detail. Patient is motivated to try and increase her intake with food and/or nutritional supplements. She is tolerating the one ensure twice per day recently started; however tells me that she eats "two to three bites and is full." Education provided that anorexia is a natural response to disease. We discussed malnutrition is a poor prognostic indicator.  Natural disease trajectory and expectations at EOL were discussed. I attempted to elicit values and goals of care important to the patient. The difference between aggressive medical intervention and comfort care was considered in light of the patient's goals of care.   Education provided that to qualify for hospice, patient would have to stop dialysis.   Hospice and Palliative Care services outpatient were explained and offered.  Advance directives, concepts specific to code status, and rehospitalization were considered and discussed.  Patient would be ok with rehospitalization in the future if needed and  is not ready to stop HD. She is motivated to work with PT/OT and is  ok with discharge home with [facility] vs rehab pending recommendations ([facility]ers [facility]). Dietary supplements and environmental alterations were discussed in detail: recommend small, frequent meals, moist foods or those with sauce/gravy take less energy to eat, assistance with meal preparation to improve energy for eating.   Patient is agreeable to outpatient Palliative Care following at discharge.   Encouraged patient/family to consider DNR/DNI status understanding evidenced based poor outcomes in similar hospitalized patient, as the cause of arrest is likely associated with advanced chronic/terminal illness rather than an easily reversible acute cardio-pulmonary event.  I shared that even if we pursued resuscitation we would not able to resolve the underlying factors. I explained that DNR/DNI does not change the medical plan and it only comes into effect after a person has arrested (died).  It is a protective measure to keep [place] from harming the patient in their last moments of life. Patient was not agreeable to DNR/DNI with understanding that she would receive CPR, defibrillation, ACLS medications, and/or intubation. She indicates she would only want these interventions for a trial period of a couple of days - she would not want long term artificial life support.  Patient has HCPOA and Living Will on file - she does not wish to make changes. First HCPOA listed is Andriea Hasegawa, whom was her husband who has passed away. HCPOA #2 is her son and #3 is her daughter who are both living.  Discussed with patient/family the importance of continued conversation with each other and the medical providers regarding overall plan of care and treatment options, ensuring decisions are within the context of the patient's values and GOCs.    Questions and concerns were addressed. The patient/family was encouraged to call with questions and/or concerns. PMT card was provided.   Primary Decision Maker: PATIENT    SUMMARY  OF RECOMMENDATIONS   Continue full scope care Continue full code. Patient would want trial of artifical life support for 2-3 days - would not want long term Patient would be ok with rehospitalization in the future. She is not ready to stop HD Goals are for discharge home with [facility] (open to rehab pending recommendations) and outpatient Palliative Care to follow. She is motivated to try to improve protein intake with food and/or nutritional supplements. Education provided regarding nutritional supplements and environmental alterations for anorexia due to chronic disease TOC notified and consulted for: outpatient Palliative Care referral PMT will continue to follow peripherally. If there are any imminent needs please call the service directly   Code Status/Advance Care Planning: Full code  Palliative Prophylaxis:  Bowel Regimen, Delirium Protocol, Frequent Pain Assessment, and Turn Reposition  Additional Recommendations (Limitations, Scope, Preferences): Full Scope Treatment  Psycho-social/Spiritual:  Desire for further Chaplaincy support:no Created space and opportunity for patient and family to express thoughts and feelings regarding patient's current medical situation.  Emotional support and therapeutic listening provided.  Prognosis:  Unable to determine  Discharge Planning: To Be Determined      Primary Diagnoses: Present on Admission:  Cellulitis of left lower extremity  Paroxysmal atrial fibrillation (HCC)  Exocrine pancreatic insufficiency  Coronary artery disease involving native coronary artery of native heart without angina pectoris  Elevated LFTs  Chronic diastolic CHF (congestive heart failure) (HCC)   I have reviewed the medical record, interviewed the patient and family, and examined the patient. The following aspects are pertinent.  Past Medical History:  Diagnosis  Date   Anemia of chronic disease    takes iron   Anxiety    Blood transfusion without reported  diagnosis    Bradycardia    Diabetes mellitus without complication (HCC)    became diabetic after Whipple procedure   Diarrhea    Dysrhythmia 04/16/2016   bradycardia due to medication    ESRD on hemodialysis [facility])    M-W-F   GERD (gastroesophageal reflux disease)    Gout    Headache    History of kidney stones 06/2013   Hyperparathyroidism (HCC)    Hypertension    PONV (postoperative nausea and vomiting)    Sleep apnea    no cpap machine. could not tolerate   Stroke (HCC) 04/16/2016   TIA 1995   Vitamin D deficiency    Social History   Socioeconomic History   Marital status: Widowed    Spouse name: Not on file   Number of children: Not on file   Years of education: Not on file   Highest education level: Not on file  Occupational History   Not on file  Tobacco Use   Smoking status: Never   Smokeless tobacco: Never  Vaping Use   Vaping status: Never Used  Substance and Sexual Activity   Alcohol use: No   Drug use: No   Sexual activity: Not on file  Other Topics Concern   Not on file  Social History Narrative   Not on file   Social Determinants of Health   Financial Resource Strain: Not on file  Food Insecurity: No Food Insecurity (12/24/2022)   Hunger Vital Sign    Worried About Running Out of Food in the Last Year: Never true    Ran Out of Food in the Last Year: Never true  Transportation Needs: No Transportation Needs (12/24/2022)   PRAPARE - Administrator, Civil Service (Medical): No    Lack of Transportation (Non-Medical): No  Physical Activity: Not on file  Stress: Not on file  Social Connections: Not on file   Family History  Problem Relation Age of Onset   Heart disease Mother    Stroke Mother    Cancer Father        colon   Alzheimer's disease Sister    Alzheimer's disease Sister    Cancer Brother        brain   Breast cancer Neg Hx    Scheduled Meds:  amiodarone  200 mg Oral Daily   aspirin  81 mg Oral Daily   Chlorhexidine  Gluconate Cloth  6 each Topical Q0600   darbepoetin (ARANESP) injection - DIALYSIS  100 mcg Subcutaneous Q Wed-1800   doxercalciferol  6 mcg Intravenous Q M,W,F-HD   feeding supplement  237 mL Oral BID BM   insulin aspart  0-5 Units Subcutaneous QHS   insulin aspart  0-6 Units Subcutaneous TID WC   lipase/protease/amylase  72,000 Units Oral TID WC   midodrine  10 mg Oral Q M,W,F   rOPINIRole  0.25 mg Oral q morning   And   rOPINIRole  0.5 mg Oral QHS   sevelamer carbonate  800 mg Oral TID WC   sodium chloride flush  3 mL Intravenous Q12H   Continuous Infusions:  cefTRIAXone (ROCEPHIN)  IV Stopped (12/25/22 1624)   PRN Meds:.acetaminophen, cycloSPORINE, guaiFENesin, lipase/protease/amylase **AND** lipase/protease/amylase Medications Prior to Admission:  Prior to Admission medications   Medication Sig Start Date End Date Taking? Authorizing Provider  amantadine (SYMMETREL) 100 MG capsule  Take 1 capsule (100 mg total) by mouth once a week. 11/20/22  Yes Love, Evlyn Kanner, PA-C  amiodarone (PACERONE) 200 MG tablet Take 1 tablet (200 mg total) by mouth daily. 11/15/22  Yes Love, Evlyn Kanner, PA-C  aspirin 81 MG chewable tablet Chew 1 tablet (81 mg total) by mouth daily. Patient taking differently: Chew 81 mg by mouth daily. Take 81mg  by mouth every morning except on dialysis days, hold in the morning and take after dialysis. 11/13/22  Yes Love, Evlyn Kanner, PA-C  B Complex-C-Zn-Folic Acid (DIALYVITE 800 WITH ZINC) 0.8 MG TABS Take 1 tablet by mouth daily.   Yes [provider]  cycloSPORINE (RESTASIS) 0.05 % ophthalmic emulsion Place 1 drop into both eyes daily as needed (irritation). Patient taking differently: Place 1 drop into both eyes daily as needed (dryness). 11/13/22  Yes Love, Evlyn Kanner, PA-C  diphenhydramine-acetaminophen (TYLENOL PM) 25-500 MG TABS tablet Take 2 tablets by mouth at bedtime.   Yes [provider]  insulin aspart (NOVOLOG FLEXPEN) 100 UNIT/ML FlexPen Inject 5-7  Units into the skin See admin instructions. Use 5 units if blood sugar over 200. Use 7 units if blood sugar over 250.   Yes [provider]  lidocaine-prilocaine (EMLA) cream Apply 1 Application topically every Monday, Wednesday, and Friday with hemodialysis.   Yes [provider]  lipase/protease/amylase (CREON) 36000 UNITS CPEP capsule Take 36,000 Units by mouth See admin instructions. Take 36,000 units (1 capsule) by mouth with meals and snacks.   Yes [provider]  meclizine (ANTIVERT) 12.5 MG tablet Take 1 tablet (12.5 mg total) by mouth 3 (three) times daily as needed for dizziness. Patient taking differently: Take 12.5 mg by mouth daily as needed for dizziness. 10/30/22  Yes Amin, Ankit C, MD  metoprolol succinate (TOPROL-XL) 25 MG 24 hr tablet Take 1 tablet (25 mg total) by mouth daily. Patient taking differently: Take 25 mg by mouth daily. Take 25mg  by mouth in the morning except on dialysis days, hold in the morning and take after dialysis if  systolic BP (top number) is less than 110. 11/29/22  Yes Jacalyn Lefevre L, NP  midodrine (PROAMATINE) 10 MG tablet Take 10 mg by mouth See admin instructions. Take 10mg  by mouth before dialysis. Bring with you to dialysis in case another dose is required.   Yes [provider]  olopatadine (PATADAY) 0.1 % ophthalmic solution Place 1 drop into both eyes 2 (two) times daily as needed for allergies.   Yes [provider]  rOPINIRole (REQUIP) 0.25 MG tablet Take 1 tablet (0.25 mg total) by mouth 2 (two) times daily. Patient taking differently: Take 0.25-0.5 mg by mouth See admin instructions. Take 0.5mg  (1 tablet) by mouth every night at bedtime and 0.25mg  (1/2 tablet) during the day as needed. 09/24/22  Yes Rodolph Bong, MD  sevelamer carbonate (RENVELA) 800 MG tablet Take 1 tablet (800 mg total) by mouth 3 (three) times daily with meals. Patient taking differently: Take 800 mg by mouth See admin  instructions. Take 800mg  by mouth with meals or snacks. 11/13/22  Yes Love, Evlyn Kanner, PA-C  acetaminophen (TYLENOL) 500 MG tablet Take 1 tablet (500 mg total) by mouth 3 (three) times daily. Patient not taking: Reported on 12/24/2022 11/13/22   Love, Evlyn Kanner, PA-C  benzonatate (TESSALON) 100 MG capsule Take 1 capsule (100 mg total) by mouth 3 (three) times daily as needed for cough. Patient not taking: Reported on 12/16/2022 11/13/22   Jacquelynn Cree,  PA-C  guaiFENesin (MUCINEX) 600 MG 12 hr tablet Take 2 tablets (1,200 mg total) by mouth 2 (two) times daily. Patient not taking: Reported on 12/24/2022 11/13/22   Love, Evlyn Kanner, PA-C  lidocaine (LIDODERM) 5 % Place 1 patch onto the skin daily. Remove & Discard patch within 12 hours or as directed by MD Patient not taking: Reported on 12/24/2022 11/14/22   Love, Evlyn Kanner, PA-C   Allergies  Allergen Reactions   Lopid [Gemfibrozil] Other (See Comments)    Myalgia    Lotemax [Loteprednol Etabonate] Rash    Skin burning  Blurry vision   Statins Other (See Comments)    Rosuvastatin Muscle weakness   Norco [Hydrocodone-Acetaminophen] Other (See Comments)    Headaches  Tolerates acetaminophen    Other Diarrhea    Real Butter - diarrhea   Vibra-Tab [Doxycycline] Other (See Comments)    Unknown reaction   Amlodipine Other (See Comments)    Norvasc   Codeine Other (See Comments)    headache   Hydrocodone Other (See Comments)   Hydrocodone-Acetaminophen Other (See Comments)    Headache. Tolerates acetaminophen.   Lisinopril Other (See Comments)   Verapamil Other (See Comments)   Review of Systems  Constitutional:  Positive for activity change, appetite change and fatigue.  Respiratory:  Negative for shortness of breath.   Gastrointestinal:  Negative for nausea and vomiting.  Neurological:  Positive for weakness.  All other systems reviewed and are negative.   Physical Exam Vitals and nursing note reviewed.  Constitutional:      General:  She is not in acute distress.    Appearance: She is cachectic.  Pulmonary:     Effort: No respiratory distress.  Skin:    General: Skin is warm and dry.  Neurological:     Mental Status: She is alert and oriented to person, place, and time.  Psychiatric:        Attention and Perception: Attention normal.        Behavior: Behavior is cooperative.        Cognition and Memory: Cognition and memory normal.     Vital Signs: BP (!) 167/77 (BP Location: Right Arm)   Pulse 72   Temp 98.2 F (36.8 C) (Oral)   Resp 17   Ht 5\' 3"  (1.6 m)   Wt 56 kg   SpO2 97%   BMI 21.87 kg/m  Pain Scale: 0-10   Pain Score: 2    SpO2: SpO2: 97 % O2 Device:SpO2: 97 % O2 Flow Rate: .   IO: Intake/output summary:  Intake/Output Summary (Last 24 hours) at 12/26/2022 1210 Last data filed at 12/26/2022 0830 Gross per 24 hour  Intake 638 ml  Output 2530 ml  Net -1892 ml    LBM: Last BM Date : 12/24/22 Baseline Weight: Weight: 55.4 kg Most recent weight: Weight: 56 kg     Palliative Assessment/Data: PPS 60%     Time In: 1200 Time Out: 1330 Time Total: 90 minutes  Signed by: Haskel Khan, NP   Please contact Palliative Medicine Team phone at [phone] for questions and concerns.  For individual provider: See Amion  *Portions of this note are a verbal dictation therefore any spelling and/or grammatical errors are due to the "Dragon [employer] One" system interpretation.

## 2022-12-26 NOTE — Progress Notes (Signed)
[facility] Progress Note   Subjective:   Patient reports HD went well yesterday but she has a headache this morning. Denies blurry vision and nausea. Thinks the swelling in her legs is improving but she has non-tender edema in her elbows, she reports it started Monday. Denies SOB, CP, dizziness.   Objective Vitals:   12/25/22 1628 12/25/22 2225 12/26/22 0352 12/26/22 0713  BP: (!) 151/67 (!) 146/74 (!) 182/80 (!) 167/77  Pulse: 67 76  72  Resp: 17 18 18 17   Temp: 98.5 F (36.9 C) 98.2 F (36.8 C) 98.2 F (36.8 C)   TempSrc: Oral  Oral   SpO2: 100% 99% 97% 97%  Weight:      Height:       Physical Exam General: Alert female in NAD Heart: RRR, no murmurs, rubs or gallops Lungs: CTA bilaterally Abdomen: Soft, non-distended, +BS Extremities: trace edema b/l lower extremities, improving erythema, blister on L shin. 1+ dependent edema bilateral upper extremities Dialysis Access:  LUE AVF + T/b  Additional Objective Labs: Basic Metabolic Panel: Recent Labs  Lab 12/24/22 0445 12/25/22 0538 12/26/22 0537  NA 142 137 136  K 3.8 4.0 4.0  CL 103 98 101  CO2 27 23 23   GLUCOSE 94 142* 96  BUN 21 37* 30*  CREATININE 3.21* 3.75* 3.19*  CALCIUM 7.3* 7.9* 8.3*   Liver Function Tests: Recent Labs  Lab 12/24/22 0445 12/25/22 0538 12/26/22 0537  AST 52* 22 16  ALT 90* 63* 44  ALKPHOS 112 103 82  BILITOT 0.6 0.6 0.6  PROT 4.2* 4.1* 4.2*  ALBUMIN 1.7* 1.6* 1.9*   No results for input(s): "LIPASE", "AMYLASE" in the last 168 hours. CBC: Recent Labs  Lab 12/23/22 1916 12/24/22 0445 12/25/22 0538 12/26/22 0537  WBC 9.1 9.3 8.9 10.3  NEUTROABS 7.5  --   --   --   HGB 8.6* 7.8* 7.3* 7.1*  HCT 28.0* 25.4* 23.8* 22.8*  MCV 104.1* 105.0* 102.1* 100.4*  PLT 178 161 161 155   Blood Culture    Component Value Date/Time   SDES BLOOD LEFT ARM 12/23/2022 2331   SDES BLOOD RIGHT ARM 12/23/2022 2331   SPECREQUEST  12/23/2022 2331    BOTTLES DRAWN AEROBIC AND  ANAEROBIC Blood Culture adequate volume   SPECREQUEST  12/23/2022 2331    BOTTLES DRAWN AEROBIC AND ANAEROBIC Blood Culture adequate volume   CULT  12/23/2022 2331    NO GROWTH 2 DAYS Performed at [facility], [address] [place], [place] [address]    CULT  12/23/2022 2331    NO GROWTH 2 DAYS Performed at [facility], [address] [place], [place] [address]    REPTSTATUS PENDING 12/23/2022 2331   REPTSTATUS PENDING 12/23/2022 2331    Cardiac Enzymes: No results for input(s): "CKTOTAL", "CKMB", "CKMBINDEX", "TROPONINI" in the last 168 hours. CBG: Recent Labs  Lab 12/25/22 0714 12/25/22 1423 12/25/22 1630 12/25/22 2210 12/26/22 0714  GLUCAP 115* 74 103* 164* 87   Iron Studies:  Recent Labs    12/24/22 0445  IRON 66  TIBC NOT CALCULATED  FERRITIN 1,405*   @lablastinr3 @ Studies/Results: No results found. Medications:  cefTRIAXone (ROCEPHIN)  IV Stopped (12/25/22 1624)    amiodarone  200 mg Oral Daily   aspirin  81 mg Oral Daily   Chlorhexidine Gluconate Cloth  6 each Topical Q0600   darbepoetin (ARANESP) injection - DIALYSIS  100 mcg Subcutaneous Q Wed-1800   doxercalciferol  6 mcg Intravenous  Q M,W,F-HD   feeding supplement  237 mL Oral BID BM   insulin aspart  0-5 Units Subcutaneous QHS   insulin aspart  0-6 Units Subcutaneous TID WC   lipase/protease/amylase  72,000 Units Oral TID WC   midodrine  10 mg Oral Q M,W,F   rOPINIRole  0.25 mg Oral q morning   And   rOPINIRole  0.5 mg Oral QHS   sevelamer carbonate  800 mg Oral TID WC   sodium chloride flush  3 mL Intravenous Q12H    Dialysis Orders: [facility]: [facility]  on MWF . 160NRe 3 hr 30 min BFR 350 DFR Auto 1.5 EDW 55kg 2K 2Ca AVF 16g  No heparin Mircera IV q 2 weeks- has not received ESA recently  Hectorol IV q HD  Assessment/Plan:  Lower leg cellulitis: On antibiotics per admitting team  ESRD:  On MWF schedule, next HD Friday  Hypertension/volume: BP has been dropping on  HD despite midodrine. Takes 10mg  pre-HD and occasionally an extra dose mid HD. Improved UF on HD yesterday and BP is running high today. Will continue midodrine and order PRN albumin with HD. May need to increase her midodrine dose outpatient.  Anemia: Hgb 8.3, does not appear she has received ESA since last admission, ESA restarted  Metabolic bone disease: Corrected calcium controlled, phos slightly elevated. Continue VDRA and binders  Nutrition:  Alb 1.9. will need renal diet and protein supplements Recent TBI/CIR admission: improved but reports she is still weak. Gets PT at home. Holding heparin with HD.    Nephrostomy tube: Exchanged last week but painful yesterday, improved today. CTM.   Rogers Blocker, PA-C 12/26/2022, 8:32 AM  [facility] Pager: [phone]

## 2022-12-26 NOTE — Progress Notes (Signed)
Progress Note    Kim Burns   ZOX:[id number]  DOB: Jan 16, 1936  DOA: 12/23/2022     2 PCP: Lewis Moccasin, MD  Initial CC: hypotension with HD and LLE cellulitis   Hospital Course: Kim Burns is an 87 y.o. Female with ESRD on HD MWF, DM, RCC s/p ablation, IPMN s/p Whipple, PAF not on AC, dCHF, hx CVA and OSA not on CPAP who presented with worsening hypotension with dialysis and left leg cellulitis.  Interval History:  No events overnight.  BP remaining stable.  Having more discomfort around nephrostomy tube site with some noted leakage.  Will ask for IR to evaluate.   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) - continue SSI and CBG monitoring   Chronic diastolic CHF (congestive heart failure) (HCC) - no s/s exac  Parkinson disease (HCC) - Continue Requip - Resume amantadine  Exocrine pancreatic insufficiency - continue creon     Old records reviewed in assessment of this patient  Antimicrobials: Cefepime 11/5 x 1 Rocephin 11/5 >> current   DVT prophylaxis:  Place and maintain sequential compression device Start: 12/24/22 1624   Code Status:   Code Status: Full Code  Mobility Assessment (Last 72 Hours)     Mobility Assessment     Row Name 12/26/22 1400 12/26/22 0830 12/25/22 1501 12/25/22 0820 12/24/22 2100   Does patient have an order for bedrest or is patient  medically unstable -- No - Continue assessment -- No - Continue assessment No - Continue assessment   What is the highest level of mobility based on the progressive mobility assessment? Level 5 (Walks with assist in room/hall) - Balance while stepping forward/back and can walk in room with assist - Complete Level 5 (Walks with assist in room/hall) - Balance while stepping forward/back and can walk in room with assist - Complete Level 5 (Walks with assist in room/hall) - Balance while stepping forward/back and can walk in room with assist - Complete Level 5 (Walks with assist in room/hall) - Balance while stepping forward/back and can walk in room with assist - Complete Level 5 (Walks with assist in room/hall) - Balance while stepping forward/back and can walk in room with assist - Complete    Row Name 12/24/22 8119           Does patient have an order for bedrest or is patient medically unstable No - Continue assessment       What is the highest level of mobility based on the progressive mobility assessment? Level 5 (Walks with assist in room/hall) - Balance while stepping forward/back and can walk in room with assist - Complete                Barriers to discharge: none Disposition Plan:  Pending PT/OT evals Status is: Inpt  Objective: Blood pressure (!) 167/77, pulse 72, temperature 98.2 F (36.8 C), temperature source Oral, resp. rate  17, height 5\' 3"  (1.6 m), weight 56 kg, SpO2 97%.  Examination:  Physical Exam Constitutional:      Appearance: Normal appearance.  HENT:     Head: Normocephalic and atraumatic.     Mouth/Throat:     Mouth: Mucous membranes are moist.  Eyes:     Extraocular Movements: Extraocular movements intact.  Cardiovascular:     Rate and Rhythm: Normal rate and regular rhythm.  Pulmonary:     Effort: Pulmonary effort is normal. No respiratory distress.     Breath sounds: Normal breath sounds. No wheezing.  Abdominal:     General: Bowel sounds are normal.  There is no distension.     Palpations: Abdomen is soft.     Tenderness: There is no abdominal tenderness.  Musculoskeletal:     Cervical back: Normal range of motion and neck supple.     Comments: Some chronic erythema and skin changes noted in RLE but worse in LLE with blood blister appreciated and surrounding erythema and mild calor (area also TTP).  Skin:    General: Skin is warm and dry.  Neurological:     General: No focal deficit present.     Mental Status: She is alert.  Psychiatric:        Mood and Affect: Mood normal.      Consultants:  Nephrology   Procedures:    Data Reviewed: Results for orders placed or performed during the hospital encounter of 12/23/22 (from the past 24 hour(s))  Glucose, capillary     Status: Abnormal   Collection Time: 12/25/22  4:30 PM  Result Value Ref Range   Glucose-Capillary 103 (H) 70 - 99 mg/dL  Glucose, capillary     Status: Abnormal   Collection Time: 12/25/22 10:10 PM  Result Value Ref Range   Glucose-Capillary 164 (H) 70 - 99 mg/dL  Comprehensive metabolic panel     Status: Abnormal   Collection Time: 12/26/22  5:37 AM  Result Value Ref Range   Sodium 136 135 - 145 mmol/L   Potassium 4.0 3.5 - 5.1 mmol/L   Chloride 101 98 - 111 mmol/L   CO2 23 22 - 32 mmol/L   Glucose, Bld 96 70 - 99 mg/dL   BUN 30 (H) 8 - 23 mg/dL   Creatinine, Ser 1.61 (H) 0.44 - 1.00 mg/dL   Calcium 8.3 (L) 8.9 - 10.3 mg/dL   Total Protein 4.2 (L) 6.5 - 8.1 g/dL   Albumin 1.9 (L) 3.5 - 5.0 g/dL   AST 16 15 - 41 U/L   ALT 44 0 - 44 U/L   Alkaline Phosphatase 82 38 - 126 U/L   Total Bilirubin 0.6 <1.2 mg/dL   GFR, Estimated 14 (L) >60 mL/min   Anion gap 12 5 - 15  CBC     Status: Abnormal   Collection Time: 12/26/22  5:37 AM  Result Value Ref Range   WBC 10.3 4.0 - 10.5 K/uL   RBC 2.27 (L) 3.87 - 5.11 MIL/uL   Hemoglobin 7.1 (L) 12.0 - 15.0 g/dL   HCT 09.6 (L) 04.5 - 40.9 %   MCV 100.4 (H) 80.0 - 100.0 fL   MCH 31.3 26.0 - 34.0 pg   MCHC 31.1  30.0 - 36.0 g/dL   RDW 81.1 (H) 91.4 - 78.2 %   Platelets 155 150 - 400 K/uL   nRBC 0.0 0.0 - 0.2 %  Magnesium     Status: None   Collection Time: 12/26/22  5:37 AM  Result Value Ref Range   Magnesium 2.2 1.7 - 2.4 mg/dL  Glucose, capillary     Status: None   Collection Time: 12/26/22  7:14 AM  Result Value Ref Range   Glucose-Capillary 87 70 - 99 mg/dL  Glucose, capillary     Status: Abnormal   Collection Time: 12/26/22 11:17 AM  Result Value Ref Range   Glucose-Capillary 143 (H) 70 - 99 mg/dL    I have reviewed pertinent nursing notes, vitals, labs, and images as necessary. I have ordered labwork to follow up on as indicated.  I have reviewed the last notes from staff over past 24 hours. I have discussed patient's care plan and test results with nursing staff, CM/SW, and other staff as appropriate.  Time spent: Greater than 50% of the 55 minute visit was spent in counseling/coordination of care for the patient as laid out in the A&P.   LOS: 2 days   Lewie Chamber, MD [facility] 12/26/2022, 3:35 PM

## 2022-12-26 NOTE — Assessment & Plan Note (Signed)
-   Continue Requip - Resume amantadine

## 2022-12-26 NOTE — Assessment & Plan Note (Addendum)
-   Patient complaining of worsening discomfort around left nephrostomy tube site.  Also noted to have leaking from drainage site when tube being flushed -Patient states was recently replaced on 12/16/2022 - found to be partially retracted; exchanged by IR on 11/11; next exchange in 6 weeks outpatient

## 2022-12-27 DIAGNOSIS — I480 Paroxysmal atrial fibrillation: Secondary | ICD-10-CM | POA: Diagnosis not present

## 2022-12-27 DIAGNOSIS — I959 Hypotension, unspecified: Secondary | ICD-10-CM | POA: Diagnosis not present

## 2022-12-27 DIAGNOSIS — L03116 Cellulitis of left lower limb: Secondary | ICD-10-CM | POA: Diagnosis not present

## 2022-12-27 DIAGNOSIS — N186 End stage renal disease: Secondary | ICD-10-CM | POA: Diagnosis not present

## 2022-12-27 LAB — GLUCOSE, CAPILLARY
Glucose-Capillary: 108 mg/dL — ABNORMAL HIGH (ref 70–99)
Glucose-Capillary: 108 mg/dL — ABNORMAL HIGH (ref 70–99)
Glucose-Capillary: 113 mg/dL — ABNORMAL HIGH (ref 70–99)
Glucose-Capillary: 116 mg/dL — ABNORMAL HIGH (ref 70–99)
Glucose-Capillary: 156 mg/dL — ABNORMAL HIGH (ref 70–99)

## 2022-12-27 LAB — CBC
HCT: 25.5 % — ABNORMAL LOW (ref 36.0–46.0)
Hemoglobin: 7.8 g/dL — ABNORMAL LOW (ref 12.0–15.0)
MCH: 31.1 pg (ref 26.0–34.0)
MCHC: 30.6 g/dL (ref 30.0–36.0)
MCV: 101.6 fL — ABNORMAL HIGH (ref 80.0–100.0)
Platelets: 174 10*3/uL (ref 150–400)
RBC: 2.51 MIL/uL — ABNORMAL LOW (ref 3.87–5.11)
RDW: 15.2 % (ref 11.5–15.5)
WBC: 12.1 10*3/uL — ABNORMAL HIGH (ref 4.0–10.5)
nRBC: 0.3 % — ABNORMAL HIGH (ref 0.0–0.2)

## 2022-12-27 LAB — CBC WITH DIFFERENTIAL/PLATELET
Abs Immature Granulocytes: 0.11 10*3/uL — ABNORMAL HIGH (ref 0.00–0.07)
Basophils Absolute: 0 10*3/uL (ref 0.0–0.1)
Basophils Relative: 0 %
Eosinophils Absolute: 0.1 10*3/uL (ref 0.0–0.5)
Eosinophils Relative: 1 %
HCT: 24.2 % — ABNORMAL LOW (ref 36.0–46.0)
Hemoglobin: 7.6 g/dL — ABNORMAL LOW (ref 12.0–15.0)
Immature Granulocytes: 1 %
Lymphocytes Relative: 12 %
Lymphs Abs: 1.2 10*3/uL (ref 0.7–4.0)
MCH: 31.5 pg (ref 26.0–34.0)
MCHC: 31.4 g/dL (ref 30.0–36.0)
MCV: 100.4 fL — ABNORMAL HIGH (ref 80.0–100.0)
Monocytes Absolute: 0.6 10*3/uL (ref 0.1–1.0)
Monocytes Relative: 6 %
Neutro Abs: 8.1 10*3/uL — ABNORMAL HIGH (ref 1.7–7.7)
Neutrophils Relative %: 80 %
Platelets: 156 10*3/uL (ref 150–400)
RBC: 2.41 MIL/uL — ABNORMAL LOW (ref 3.87–5.11)
RDW: 15.2 % (ref 11.5–15.5)
WBC: 10.2 10*3/uL (ref 4.0–10.5)
nRBC: 0 % (ref 0.0–0.2)

## 2022-12-27 LAB — RENAL FUNCTION PANEL
Albumin: 1.9 g/dL — ABNORMAL LOW (ref 3.5–5.0)
Anion gap: 15 (ref 5–15)
BUN: 38 mg/dL — ABNORMAL HIGH (ref 8–23)
CO2: 22 mmol/L (ref 22–32)
Calcium: 8.9 mg/dL (ref 8.9–10.3)
Chloride: 98 mmol/L (ref 98–111)
Creatinine, Ser: 3.85 mg/dL — ABNORMAL HIGH (ref 0.44–1.00)
GFR, Estimated: 11 mL/min — ABNORMAL LOW (ref >60)
Glucose, Bld: 133 mg/dL — ABNORMAL HIGH (ref 70–99)
Phosphorus: 6.2 mg/dL — ABNORMAL HIGH (ref 2.5–4.6)
Potassium: 4.4 mmol/L (ref 3.5–5.1)
Sodium: 135 mmol/L (ref 135–145)

## 2022-12-27 LAB — MAGNESIUM: Magnesium: 2.5 mg/dL — ABNORMAL HIGH (ref 1.7–2.4)

## 2022-12-27 MED ORDER — METOPROLOL SUCCINATE ER 25 MG PO TB24
12.5000 mg | ORAL_TABLET | Freq: Every day | ORAL | Status: DC
  Administered 2022-12-27 – 2022-12-29 (×2): 12.5 mg via ORAL
  Filled 2022-12-27 (×3): qty 1

## 2022-12-27 MED ORDER — AMIODARONE LOAD VIA INFUSION
150.0000 mg | Freq: Once | INTRAVENOUS | Status: AC
  Administered 2022-12-27: 150 mg via INTRAVENOUS
  Filled 2022-12-27: qty 83.34

## 2022-12-27 MED ORDER — METOPROLOL TARTRATE 5 MG/5ML IV SOLN
5.0000 mg | Freq: Once | INTRAVENOUS | Status: AC
  Administered 2022-12-27: 5 mg via INTRAVENOUS
  Filled 2022-12-27: qty 5

## 2022-12-27 MED ORDER — AMIODARONE HCL IN DEXTROSE 360-4.14 MG/200ML-% IV SOLN
60.0000 mg/h | INTRAVENOUS | Status: AC
  Administered 2022-12-27 (×2): 60 mg/h via INTRAVENOUS
  Filled 2022-12-27 (×3): qty 200

## 2022-12-27 MED ORDER — ORAL CARE MOUTH RINSE: 15.0000 mL | OROMUCOSAL | Status: DC | PRN

## 2022-12-27 MED ORDER — SODIUM CHLORIDE 0.9 % IV BOLUS
500.0000 mL | Freq: Once | INTRAVENOUS | Status: AC
  Administered 2022-12-27: 500 mL via INTRAVENOUS

## 2022-12-27 MED ORDER — ALBUMIN HUMAN 25 % IV SOLN
25.0000 g | Freq: Four times a day (QID) | INTRAVENOUS | Status: AC
  Administered 2022-12-27: 25 g via INTRAVENOUS
  Filled 2022-12-27 (×2): qty 100

## 2022-12-27 MED ORDER — AMIODARONE HCL IN DEXTROSE 360-4.14 MG/200ML-% IV SOLN
30.0000 mg/h | INTRAVENOUS | Status: DC
  Administered 2022-12-27 – 2022-12-28 (×2): 30 mg/h via INTRAVENOUS
  Filled 2022-12-27 (×2): qty 200

## 2022-12-27 NOTE — Plan of Care (Signed)

## 2022-12-27 NOTE — Plan of Care (Signed)
Notified by Dialysis Charge nurse that patient was not able to have her blood rinsed back and the end of HD due to +chlorine test.  Hgb 7.6 this AM.  Put in order to recheck Hgb.   Virgina Norfolk, PA-C [employer]

## 2022-12-27 NOTE — Plan of Care (Signed)
Went to obtain consent for L PCN exchange, patient is in afib with RVR per RN.   There is clean urin in the collecting bag, patient denies flank pain.  Mot recent VS at 1510 hrs with tachycardia HR 151, other VS wnl.   Will put the exchange on hold for now, plan to exchange on Monday.  Attending notified.   Please call IR for questions and concerns.    Lynann Bologna Genevra Orne PA-C 12/27/2022 3:25 PM

## 2022-12-27 NOTE — TOC Progression Note (Signed)
Transition of Care [facility]) - Progression Note    Patient Details  Name: Kim Burns MRN: [id number] Date of Birth: 1935/11/11  Transition of Care [facility]) CM/SW Contact  Tom-Johnson, Hershal Coria, RN Phone Number: 12/27/2022, 4:17 PM  Clinical Narrative:     CM spoke with patient at bedside and son, michael via phone about home health and Outpatient Palliative recommendations.  [name] states patient was active with [facility] and would like to use at discharge. CM called in referral to [facility] with acceptance voiced, info on AVS. Patient has no preference with Palliative, CM sent referral to [facility] and Misty noted acceptance, info on AVS.   Patient not Medically ready for discharge.  CM will continue to follow as patient progresses with care towards discharge.              Expected Discharge Plan and Services In-house Referral: Clinical Social Work Discharge Planning Services: CM Consult Post Acute Care Choice: Home Health Living arrangements for the past 2 months: Single Family Home                           HH Arranged: PT, OT, RN, Disease Management HH Agency: [facility] Date [facility] Agency Contacted: 12/27/22 Time HH Agency Contacted: 1042 Representative spoke with at [facility] Agency: Tresa Endo   Social Determinants of Health (SDOH) Interventions SDOH Screenings   Food Insecurity: No Food Insecurity (12/24/2022)  Housing: Low Risk  (12/24/2022)  Transportation Needs: No Transportation Needs (12/24/2022)  Utilities: Not At Risk (12/24/2022)  Tobacco Use: Low Risk  (12/24/2022)    Readmission Risk Interventions    10/30/2022    3:26 PM 08/27/2021   10:16 AM  Readmission Risk Prevention Plan  Transportation Screening Complete Complete  PCP or Specialist Appt within 3-5 Days  Complete  HRI or Home Care Consult  Complete  Social Work Consult for Recovery Care Planning/Counseling  Complete  Palliative Care Screening  Not Applicable  Medication Review Special educational needs teacher) Complete Complete  HRI or Home Care Consult Complete   SW Recovery Care/Counseling Consult Complete   Palliative Care Screening Not Applicable   Skilled Nursing Facility Not Applicable

## 2022-12-27 NOTE — Progress Notes (Signed)
Progress Note    Kim Burns   [id number]  DOB: March 08, 1935  DOA: 12/23/2022     3 PCP: Lewis Moccasin, MD  Initial CC: hypotension with HD and LLE cellulitis   Hospital Course: Kim Burns is an 87 y.o. Female with ESRD on HD MWF, DM, RCC s/p ablation, IPMN s/p Whipple, PAF not on AC, dCHF, hx CVA and OSA not on CPAP who presented with worsening hypotension with dialysis and left leg cellulitis.  Interval History:   Seen in room after returning from dialysis.  She had reverted to A-fib with RVR with rate in the 170s and symptomatic.  Blood pressure was still soft in the 90s over 60s.  Total UF today was 1.8 L Responded a little to vagal maneuvers but giving Lopressor 5 mg IV x 1 and will give small IVF bolus and IV albumin. Home Toprol has been on hold in setting of persistent hypotension.    Assessment and Plan: * Cellulitis of left lower extremity - no purulent drainage; some bruising noted - continue rocephin; seems to be improved from admission   Hypotension - On midodrine on dialysis days and pressures are improved  Nephrostomy complication (HCC) - Patient complaining of worsening discomfort around left nephrostomy tube site.  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) - continue SSI and CBG monitoring   Chronic diastolic CHF (congestive heart failure) (HCC) - no s/s exac  Parkinson disease (HCC) - Continue  Requip - Resume amantadine  Exocrine pancreatic insufficiency - continue creon     Old records reviewed in assessment of this patient  Antimicrobials: Cefepime 11/5 x 1 Rocephin 11/5 >> current   DVT prophylaxis:  Place and maintain sequential compression device Start: 12/24/22 1624   Code Status:   Code Status: Full Code  Mobility Assessment (Last 72 Hours)     Mobility Assessment     Row Name 12/27/22 0800 12/26/22 2140 12/26/22 1400 12/26/22 0830 12/25/22 1501   Does patient have an order for bedrest or is patient medically unstable No - Continue assessment No - Continue assessment -- No - Continue assessment --   What is the highest level of mobility based on the progressive mobility assessment? Level 5 (Walks with assist in room/hall) - Balance while stepping forward/back and can walk in room with assist - Complete Level 5 (Walks with assist in room/hall) - Balance while stepping forward/back and can walk in room with assist - Complete Level 5 (Walks with assist in room/hall) - Balance while stepping forward/back and can walk in room with assist - Complete Level 5 (Walks with assist in room/hall) - Balance while stepping forward/back and can walk in room with assist - Complete Level 5 (Walks with assist in room/hall) - Balance while stepping forward/back and can walk in room with assist - Complete    Row Name 12/25/22 0820 12/24/22 2100         Does patient have an  order for bedrest or is patient medically unstable No - Continue assessment No - Continue assessment      What is the highest level of mobility based on the progressive mobility assessment? Level 5 (Walks with assist in room/hall) - Balance while stepping forward/back and can walk in room with assist - Complete Level 5 (Walks with assist in room/hall) - Balance while stepping forward/back and can walk in room with assist - Complete               Barriers to discharge: none Disposition Plan:  Pending PT/OT  evals Status is: Inpt  Objective: Blood pressure (!) 106/94, pulse (!) 136, temperature 98.4 F (36.9 C), resp. rate 17, height 5\' 3"  (1.6 m), weight 55 kg, SpO2 95%.  Examination:  Physical Exam Constitutional:      Appearance: Normal appearance.  HENT:     Head: Normocephalic and atraumatic.     Mouth/Throat:     Mouth: Mucous membranes are moist.  Eyes:     Extraocular Movements: Extraocular movements intact.  Cardiovascular:     Rate and Rhythm: Normal rate and regular rhythm.  Pulmonary:     Effort: Pulmonary effort is normal. No respiratory distress.     Breath sounds: Normal breath sounds. No wheezing.  Abdominal:     General: Bowel sounds are normal. There is no distension.     Palpations: Abdomen is soft.     Tenderness: There is no abdominal tenderness.  Musculoskeletal:     Cervical back: Normal range of motion and neck supple.     Comments: Some chronic erythema and skin changes noted in RLE but worse in LLE with blood blister appreciated and surrounding erythema and mild calor (area also TTP).  Skin:    General: Skin is warm and dry.  Neurological:     General: No focal deficit present.     Mental Status: She is alert.  Psychiatric:        Mood and Affect: Mood normal.      Consultants:  Nephrology   Procedures:    Data Reviewed: Results for orders placed or performed during the hospital encounter of 12/23/22 (from the past 24 hour(s))  Glucose, capillary     Status: Abnormal   Collection Time: 12/26/22  4:14 PM  Result Value Ref Range   Glucose-Capillary 175 (H) 70 - 99 mg/dL  Glucose, capillary     Status: Abnormal   Collection Time: 12/26/22  8:28 PM  Result Value Ref Range   Glucose-Capillary 189 (H) 70 - 99 mg/dL  Glucose, capillary     Status: Abnormal   Collection Time: 12/27/22  2:29 AM  Result Value Ref Range   Glucose-Capillary 108 (H) 70 - 99 mg/dL  Magnesium     Status: Abnormal   Collection Time: 12/27/22  5:42 AM  Result Value Ref  Range   Magnesium 2.5 (H) 1.7 - 2.4 mg/dL  CBC with Differential/Platelet     Status: Abnormal   Collection Time: 12/27/22  5:42 AM  Result Value Ref Range   WBC 10.2 4.0 - 10.5 K/uL   RBC 2.41 (L) 3.87 - 5.11 MIL/uL   Hemoglobin 7.6 (L) 12.0 - 15.0 g/dL   HCT 14.7 (L) 82.9 - 56.2 %   MCV 100.4 (H) 80.0 - 100.0 fL   MCH 31.5 26.0 - 34.0 pg   MCHC 31.4 30.0 - 36.0 g/dL   RDW 13.0 86.5 - 78.4 %   Platelets 156 150 - 400 K/uL  nRBC 0.0 0.0 - 0.2 %   Neutrophils Relative % 80 %   Neutro Abs 8.1 (H) 1.7 - 7.7 K/uL   Lymphocytes Relative 12 %   Lymphs Abs 1.2 0.7 - 4.0 K/uL   Monocytes Relative 6 %   Monocytes Absolute 0.6 0.1 - 1.0 K/uL   Eosinophils Relative 1 %   Eosinophils Absolute 0.1 0.0 - 0.5 K/uL   Basophils Relative 0 %   Basophils Absolute 0.0 0.0 - 0.1 K/uL   Immature Granulocytes 1 %   Abs Immature Granulocytes 0.11 (H) 0.00 - 0.07 K/uL  Renal function panel     Status: Abnormal   Collection Time: 12/27/22  5:42 AM  Result Value Ref Range   Sodium 135 135 - 145 mmol/L   Potassium 4.4 3.5 - 5.1 mmol/L   Chloride 98 98 - 111 mmol/L   CO2 22 22 - 32 mmol/L   Glucose, Bld 133 (H) 70 - 99 mg/dL   BUN 38 (H) 8 - 23 mg/dL   Creatinine, Ser 6.04 (H) 0.44 - 1.00 mg/dL   Calcium 8.9 8.9 - 54.0 mg/dL   Phosphorus 6.2 (H) 2.5 - 4.6 mg/dL   Albumin 1.9 (L) 3.5 - 5.0 g/dL   GFR, Estimated 11 (L) >60 mL/min   Anion gap 15 5 - 15  Glucose, capillary     Status: Abnormal   Collection Time: 12/27/22  7:47 AM  Result Value Ref Range   Glucose-Capillary 108 (H) 70 - 99 mg/dL  Glucose, capillary     Status: Abnormal   Collection Time: 12/27/22  1:28 PM  Result Value Ref Range   Glucose-Capillary 113 (H) 70 - 99 mg/dL    I have reviewed pertinent nursing notes, vitals, labs, and images as necessary. I have ordered labwork to follow up on as indicated.  I have reviewed the last notes from staff over past 24 hours. I have discussed patient's care plan and test results with  nursing staff, CM/SW, and other staff as appropriate.  Time spent: Greater than 50% of the 55 minute visit was spent in counseling/coordination of care for the patient as laid out in the A&P.   LOS: 3 days   Lewie Chamber, MD [facility] 12/27/2022, 2:28 PM

## 2022-12-27 NOTE — Progress Notes (Signed)
PT Cancellation Note  Patient Details Name: Kim Burns MRN: [id number] DOB: 11-13-1935   Cancelled Treatment:    Reason Eval/Treat Not Completed: Medical issues which prohibited therapy   Second follow-up attempt. Resting HR now in 140s. Will hold PT until rate controlled. Spoke with pt, appreciative of visit.  Kathlyn Sacramento, PT, DPT [facility]  Rehabilitation Services Physical Therapist Office: [phone] Website: [email or web address]   Berton Mount 12/27/2022, 5:15 PM

## 2022-12-27 NOTE — Care Management Important Message (Signed)
Important Message  Patient Details  Name: Kim Burns MRN: [id number] Date of Birth: 17-Jul-1935   Important Message Given:  Yes - Medicare IM     Dorena Bodo 12/27/2022, 3:22 PM

## 2022-12-27 NOTE — Progress Notes (Signed)
PT Cancellation Note  Patient Details Name: RAWAN MISTER MRN: [id number] DOB: 06/02/1935   Cancelled Treatment:    Reason Eval/Treat Not Completed: Patient at procedure or test/unavailable  Currently off unit for HD. Will check back this afternoon as schedule permits.  Kathlyn Sacramento, PT, DPT [facility]  Rehabilitation Services Physical Therapist Office: [phone] Website: [email or web address]  Berton Mount 12/27/2022, 10:03 AM

## 2022-12-27 NOTE — Progress Notes (Signed)
   12/27/22 1235  Vitals  Temp 98.4 F (36.9 C)  Pulse Rate (!) 103  Resp 16  BP 122/73  SpO2 93 %  O2 Device Room Air  Weight 55 kg  Type of Weight Post-Dialysis  Oxygen Therapy  Patient Activity (if Appropriate) In bed  Pulse Oximetry Type Continuous  Post Treatment  Dialyzer Clearance Other (Comment) (no rinseback)  Hemodialysis Intake (mL) 0 mL  Liters Processed 80  Fluid Removed (mL) 1800 mL  Tolerated HD Treatment Yes  AVG/AVF Arterial Site Held (minutes) 10 minutes  AVG/AVF Venous Site Held (minutes) 10 minutes   Received patient in bed to unit.  Alert and oriented.  Informed consent signed and in chart.   TX duration:3.25hrs due to +chlorine test tx terminated MD notified  Patient tolerated well.  Transported back to the room  Alert, without acute distress.  Hand-off given to patient's nurse.   Access used: LAVF Access issues: none  Total UF removed: 1.8L Medication(s) given: hecterol   [facility]

## 2022-12-27 NOTE — Procedures (Signed)
Patient seen on Hemodialysis. BP (!) 162/75   Pulse 78   Temp 97.8 F (36.6 C) (Oral)   Resp 20   Ht 5\' 3"  (1.6 m)   Wt 56.4 kg Comment: bed  SpO2 97%   BMI 22.03 kg/m   QB 400, UF goal 3L Tolerating treatment without complaints at this time. LLE cellulitis improving on ceftriaxone- stop date 11/12  Kim Bills MD [facility]. Office # [phone] Pager # [phone] 9:39 AM

## 2022-12-27 NOTE — Progress Notes (Signed)
Transfer orders received for 2C. Patient brought to unit in bed with all belongings. Son made aware. Amiodarone gtt to be started on 2C.

## 2022-12-27 NOTE — Progress Notes (Signed)
OT Cancellation Note  Patient Details Name: TMYA PIZZOFERRATO MRN: [id number] DOB: 03-22-1935   Cancelled Treatment:    Reason Eval/Treat Not Completed: Patient at procedure or test/ unavailable Patient off floor at HD, OT will follow back as time permits.  [name] E. Kenzlei Runions, OTR/L Acute Rehabilitation Services [phone]   [name] 12/27/2022, 10:45 AM

## 2022-12-27 NOTE — Consult Note (Signed)
[facility] Liaison Consult Note    12/27/2022  Kim Burns 01/11/1936 [id number] Insurance:   Primary Care Provider: Lewis Moccasin, MD,  this provider is listed for the transition of care follow up appointments.   [facility] Liaison met patient at bedside at [facility].  Patient is with medical staff.   The patient was screened for unplanned readmission risk hospitalization with noted extreme high risk score for unplanned readmission risk 4 hospital admissions in 6 months.  The patient was assessed for potential Community Care Coordination service needs for post hospital transition for care coordination. .   Plan: [facility] Liaison will continue to follow progress and disposition to asess for post hospital community care coordination/management needs.  Referral request for community care coordination: disposition unknown.   VBCI Community Care, Population Health does not replace or interfere with any arrangements made by the Inpatient Transition of Care team.   For questions contact:   Charlesetta Shanks, RN, BSN, CCM [place]  [facility], [facility] Liaison Direct Dial: [phone] or secure chat Email: [email or web address]

## 2022-12-28 DIAGNOSIS — L03116 Cellulitis of left lower limb: Secondary | ICD-10-CM | POA: Diagnosis not present

## 2022-12-28 DIAGNOSIS — I959 Hypotension, unspecified: Secondary | ICD-10-CM | POA: Diagnosis not present

## 2022-12-28 DIAGNOSIS — N99528 Other complication of other external stoma of urinary tract: Secondary | ICD-10-CM | POA: Diagnosis not present

## 2022-12-28 DIAGNOSIS — N186 End stage renal disease: Secondary | ICD-10-CM | POA: Diagnosis not present

## 2022-12-28 LAB — CBC WITH DIFFERENTIAL/PLATELET
Abs Immature Granulocytes: 0.05 10*3/uL (ref 0.00–0.07)
Basophils Absolute: 0 10*3/uL (ref 0.0–0.1)
Basophils Relative: 0 %
Eosinophils Absolute: 0.2 10*3/uL (ref 0.0–0.5)
Eosinophils Relative: 2 %
HCT: 24.5 % — ABNORMAL LOW (ref 36.0–46.0)
Hemoglobin: 7.5 g/dL — ABNORMAL LOW (ref 12.0–15.0)
Immature Granulocytes: 1 %
Lymphocytes Relative: 19 %
Lymphs Abs: 1.5 10*3/uL (ref 0.7–4.0)
MCH: 31.9 pg (ref 26.0–34.0)
MCHC: 30.6 g/dL (ref 30.0–36.0)
MCV: 104.3 fL — ABNORMAL HIGH (ref 80.0–100.0)
Monocytes Absolute: 0.6 10*3/uL (ref 0.1–1.0)
Monocytes Relative: 7 %
Neutro Abs: 5.9 10*3/uL (ref 1.7–7.7)
Neutrophils Relative %: 71 %
Platelets: 182 10*3/uL (ref 150–400)
RBC: 2.35 MIL/uL — ABNORMAL LOW (ref 3.87–5.11)
RDW: 15.3 % (ref 11.5–15.5)
WBC: 8.2 10*3/uL (ref 4.0–10.5)
nRBC: 0.5 % — ABNORMAL HIGH (ref 0.0–0.2)

## 2022-12-28 LAB — RENAL FUNCTION PANEL
Albumin: 2.1 g/dL — ABNORMAL LOW (ref 3.5–5.0)
Anion gap: 11 (ref 5–15)
BUN: 19 mg/dL (ref 8–23)
CO2: 28 mmol/L (ref 22–32)
Calcium: 9.2 mg/dL (ref 8.9–10.3)
Chloride: 95 mmol/L — ABNORMAL LOW (ref 98–111)
Creatinine, Ser: 2.53 mg/dL — ABNORMAL HIGH (ref 0.44–1.00)
GFR, Estimated: 18 mL/min — ABNORMAL LOW (ref >60)
Glucose, Bld: 125 mg/dL — ABNORMAL HIGH (ref 70–99)
Phosphorus: 4.9 mg/dL — ABNORMAL HIGH (ref 2.5–4.6)
Potassium: 4.1 mmol/L (ref 3.5–5.1)
Sodium: 134 mmol/L — ABNORMAL LOW (ref 135–145)

## 2022-12-28 LAB — GLUCOSE, CAPILLARY
Glucose-Capillary: 119 mg/dL — ABNORMAL HIGH (ref 70–99)
Glucose-Capillary: 170 mg/dL — ABNORMAL HIGH (ref 70–99)
Glucose-Capillary: 163 mg/dL — ABNORMAL HIGH (ref 70–99)
Glucose-Capillary: 138 mg/dL — ABNORMAL HIGH (ref 70–99)

## 2022-12-28 LAB — MAGNESIUM: Magnesium: 2.1 mg/dL (ref 1.7–2.4)

## 2022-12-28 MED ORDER — AMIODARONE HCL 200 MG PO TABS
200.0000 mg | ORAL_TABLET | Freq: Every day | ORAL | Status: DC
Start: 2022-12-28 — End: 2023-01-02
  Administered 2022-12-28 – 2023-01-02 (×6): 200 mg via ORAL
  Filled 2022-12-28 (×6): qty 1

## 2022-12-28 NOTE — Progress Notes (Signed)
Physical Therapy Treatment Patient Details Name: Kim Burns MRN: [id number] DOB: 08/31/1935 Today's Date: 12/28/2022   History of Present Illness 87 y.o. F who presented 11/4 with worsening hypotension with dialysis and left leg cellulitis; notable recent admissions, 08/2022 aFib with RVR, early Sept 2024 (found down) with R frontal contusion, skull fxs, developed subdural hematomas, L pubic ramus fx (WBAT), small non-displaced S4 fx, ischemic CVAs; Underwent rehab at [facility] and dc'd to home on 9/28; Afib with RVR 11/8 PMHx:  ESRD on HD MWF, DM, RCC s/p ablation, IPMN s/p Whipple, pAF not on AC, dCHF, hx CVA and OSA not on CPAP.    PT Comments  More fatigued today, still complaining of dizziness. Ambulated short distance into hallway with CGA for safety and cues for proper AD use within BOS. No overt buckling or LOB with this distance but pt could not progress further. HR in 80s. Min assist for sit<>stand transfers and cues for technique. Hopeful pt continues to progress with mobility during admission; encouraged OOB frequently and to mobilize with staff to prevent decline. Currently at a level family can assist with but will update as appropriate. Patient will continue to benefit from skilled physical therapy services to further improve independence with functional mobility.     If plan is discharge home, recommend the following: A little help with walking and/or transfers;A little help with bathing/dressing/bathroom;Assistance with cooking/housework;Assist for transportation;Help with stairs or ramp for entrance   Can travel by private vehicle        Equipment Recommendations  None recommended by PT    Recommendations for Other Services       Precautions / Restrictions Precautions Precautions: Fall Restrictions Weight Bearing Restrictions: No     Mobility  Bed Mobility Overal bed mobility: Needs Assistance Bed Mobility: Supine to Sit     Supine to sit: Contact guard, HOB  elevated, Used rails     General bed mobility comments: CGA for safety, pt pulls through therapist hand to rise to EOB. States son can help    Transfers Overall transfer level: Needs assistance Equipment used: Rolling walker (2 wheels) Transfers: Sit to/from Stand Sit to Stand: Min assist           General transfer comment: Attempts to scoot too close to EOB, educated on safety and better technique to rise with cues for hand placement, min assist to boost. Controls descent well in to chair with further cues to reach back first.    Ambulation/Gait Ambulation/Gait assistance: Contact guard assist Gait Distance (Feet): 50 Feet Assistive device: Rolling walker (2 wheels) Gait Pattern/deviations: Step-through pattern, Decreased stride length Gait velocity: dec Gait velocity interpretation: <1.31 ft/sec, indicative of household ambulator   General Gait Details: VC for upright posture and awareness of walker placement, stepping outside of BOS on occasion. No overt buckling but required CGA. Felt LEs getting weak and could not ambulate further with subjective report of dizziness as well.   Stairs             Wheelchair Mobility     Tilt Bed    Modified Rankin (Stroke Patients Only)       Balance Overall balance assessment: Needs assistance Sitting-balance support: No upper extremity supported, Feet supported Sitting balance-Leahy Scale: Fair Sitting balance - Comments: EOB ADLs   Standing balance support: No upper extremity supported Standing balance-Leahy Scale: Fair Standing balance comment: More stable with RW  Cognition Arousal: Alert Behavior During Therapy: WFL for tasks assessed/performed Overall Cognitive Status: Within Functional Limits for tasks assessed                                          Exercises      General Comments        Pertinent Vitals/Pain Pain Assessment Pain Assessment:  Faces Faces Pain Scale: Hurts a little bit Pain Location: LLE Pain Descriptors / Indicators: Discomfort Pain Intervention(s): Monitored during session, Repositioned    Home Living                          Prior Function            PT Goals (current goals can now be found in the care plan section) Acute Rehab PT Goals Patient Stated Goal: Get well PT Goal Formulation: With patient Time For Goal Achievement: 01/09/23 Potential to Achieve Goals: Good Progress towards PT goals: Progressing toward goals    Frequency    Min 1X/week      PT Plan      Co-evaluation              AM-PAC PT "6 Clicks" Mobility   Outcome Measure  Help needed turning from your back to your side while in a flat bed without using bedrails?: A Little Help needed moving from lying on your back to sitting on the side of a flat bed without using bedrails?: A Little Help needed moving to and from a bed to a chair (including a wheelchair)?: A Little Help needed standing up from a chair using your arms (e.g., wheelchair or bedside chair)?: A Little Help needed to walk in hospital room?: A Little Help needed climbing 3-5 steps with a railing? : A Lot 6 Click Score: 17    End of Session Equipment Utilized During Treatment: Gait belt Activity Tolerance: Patient tolerated treatment well Patient left: in chair;with call bell/phone within reach;with chair alarm set;with SCD's reapplied Nurse Communication: Mobility status PT Visit Diagnosis: Unsteadiness on feet (R26.81);Other abnormalities of gait and mobility (R26.89);Muscle weakness (generalized) (M62.81);History of falling (Z91.81);Pain;Dizziness and giddiness (R42);BPPV Pain - part of body:  (BIL LEs)     Time: 7829-5621 PT Time Calculation (min) (ACUTE ONLY): 18 min  Charges:    $Gait Training: 8-22 mins PT General Charges $$ ACUTE PT VISIT: 1 Visit                     Kathlyn Sacramento, PT, DPT [facility]  Services Physical Therapist Office: [phone] Website: [email or web address]    Berton Mount 12/28/2022, 4:51 PM

## 2022-12-28 NOTE — Progress Notes (Signed)
Pt assessed, PIV to right hand unsuccessful, right arm with edema/skin breakdown. RN notified to ask covering Nephrologist to allow PIV to left hand.

## 2022-12-28 NOTE — Progress Notes (Signed)
Patient ID: Kim Burns, female   DOB: 05/22/1935, 87 y.o.   MRN: [id number] [facility] Progress Note   Assessment/ Plan:   Left lower leg cellulitis: Clinically improving with ongoing ceftriaxone therapy.  Patient reporting symptomatic improvement.  ESRD: Usually on a Monday/Wednesday/Friday schedule and underwent hemodialysis yesterday without problems.  Postprocedure noted to have atrial fibrillation with RVR prompting transfer to stepdown unit.  Next dialysis Monday. Atrial fibrillation with rapid ventricular response: May indeed have been provoked by her acute on chronic hypotension exacerbated by dialysis/ultrafiltration and for which she is on midodrine.  Currently back in sinus rhythm and appears to be rate controlled status post amiodarone and metoprolol.  Anemia: Lower hemoglobin and hematocrit noted likely related to inability to range back blood due to positive chlorine test on dialysis.  Low threshold for PRBC transfusion.  Metabolic bone disease: Corrected calcium controlled, phos slightly elevated. Continue VDRA and binders  Nutrition:  Alb 1.9. will need renal diet and protein supplements Recent TBI/CIR admission: improved but reports she is still weak. Gets PT at home. Holding heparin with HD.    Nephrostomy tube: Plans noted for exchange however, deferred because of atrial fibrillation with RVR yesterday.  Subjective:   She suffered atrial fibrillation with RVR overnight prompting transfer down to stepdown unit.  Now back in sinus rhythm but symptomatic with dizziness earlier this morning.  I was paged overnight with regards to obtaining intravenous access in her left arm ipsilateral to her fistula.  The number that I was paged from was [phone] that I called back without response at 0025.   Objective:   BP 135/61 (BP Location: Right Arm)   Pulse (!) 56   Temp 97.7 F (36.5 C) (Oral)   Resp 16   Ht 5\' 3"  (1.6 m)   Wt 55 kg   SpO2 96%   BMI 21.48 kg/m    Physical Exam: Gen: Appears fatigued but comfortable resting flat in bed CVS: Pulse regular bradycardia, normal S1 and S2 Resp: Anteriorly clear to auscultation, no rales/rhonchi Abd: Soft, flat, nontender, bowel sounds normal Ext: Legs in SCDs, decreased erythema left lower extremity.  Intact dressings left upper arm aVF  Labs: BMET Recent Labs  Lab 12/23/22 1916 12/24/22 0445 12/25/22 0538 12/26/22 0537 12/27/22 0542 12/28/22 0218  NA 141 142 137 136 135 134*  K 4.0 3.8 4.0 4.0 4.4 4.1  CL 100 103 98 101 98 95*  CO2 29 27 23 23 22 28   GLUCOSE 174* 94 142* 96 133* 125*  BUN 17 21 37* 30* 38* 19  CREATININE 2.99* 3.21* 3.75* 3.19* 3.85* 2.53*  CALCIUM 7.5* 7.3* 7.9* 8.3* 8.9 9.2  PHOS  --   --   --   --  6.2* 4.9*   CBC Recent Labs  Lab 12/23/22 1916 12/24/22 0445 12/26/22 0537 12/27/22 0542 12/27/22 1439 12/28/22 0218  WBC 9.1   < > 10.3 10.2 12.1* 8.2  NEUTROABS 7.5  --   --  8.1*  --  5.9  HGB 8.6*   < > 7.1* 7.6* 7.8* 7.5*  HCT 28.0*   < > 22.8* 24.2* 25.5* 24.5*  MCV 104.1*   < > 100.4* 100.4* 101.6* 104.3*  PLT 178   < > 155 156 174 182   < > = values in this interval not displayed.    Medications:     amantadine  100 mg Oral Weekly   aspirin  81 mg Oral Daily   Chlorhexidine Gluconate Cloth  6 each Topical Q0600   darbepoetin (ARANESP) injection - DIALYSIS  100 mcg Subcutaneous Q Wed-1800   doxercalciferol  6 mcg Intravenous Q M,W,F-HD   feeding supplement  237 mL Oral BID BM   insulin aspart  0-5 Units Subcutaneous QHS   insulin aspart  0-6 Units Subcutaneous TID WC   lipase/protease/amylase  72,000 Units Oral TID WC   metoprolol succinate  12.5 mg Oral Daily   midodrine  10 mg Oral Q M,W,F   rOPINIRole  0.25 mg Oral q morning   And   rOPINIRole  0.5 mg Oral QHS   sevelamer carbonate  800 mg Oral TID WC   sodium chloride flush  3 mL Intravenous Q12H   Zetta Bills, MD 12/28/2022, 8:18 AM

## 2022-12-28 NOTE — Progress Notes (Signed)
Two scheduled doses of Albumin at 0000 and 0600 were not given this shift. Patient has only one IV available and has amiodarone gtt going. Albumin and amiodarone are not compatible. IV team consult was put in to established a second IV. However, IV team was not able to find anything else usable on right arm. Left arm has a fistula.   I paged Dr. Allena Katz, nephrology, at 0022 to see if on this occasion we might get permission to use the arm with the fistula. No new orders were put in and page was not returned.  Dr. Lazarus Salines was alerted to the situation about 0200.

## 2022-12-28 NOTE — Progress Notes (Signed)
Progress Note    Kim Burns   MVH:[id number]  DOB: Jul 23, 1935  DOA: 12/23/2022     4 PCP: Kim Moccasin, MD  Initial CC: hypotension with HD and LLE cellulitis   Hospital Course: Kim Burns is an 87 y.o. Female with ESRD on HD MWF, DM, RCC s/p ablation, IPMN s/p Whipple, PAF not on AC, dCHF, hx CVA and OSA not on CPAP who presented with worsening hypotension with dialysis and left leg cellulitis.  Interval History:  Converted back to normal sinus with heart rate in the 50s overnight.  Feels much better this morning. We will transition back to oral amiodarone and transfer out of progressive.  Assessment and Plan: * Cellulitis of left lower extremity - no purulent drainage; some bruising noted - continue rocephin; seems to be improved from admission   Hypotension - On midodrine on dialysis days and pressures are improved  Nephrostomy complication (HCC) - Patient complaining of worsening discomfort around left nephrostomy tube site.  Also noted to have leaking from drainage site when tube being flushed -Patient states was recently replaced on 12/16/2022 - Will ask for IR to help with evaluating in case of malpositioning -Tentative plan for tube exchange on 12/30/2022  End-stage renal disease on hemodialysis (HCC) - on HD MWF - continue as per nephrology   Hypoalbuminemia - s/p albumin  Elevated LFTs - Hepatic steatosis noted on RUQ ultrasound; hx of CCY - Trend LFTs intermittently  Paroxysmal atrial fibrillation (HCC) - Not on anticoagulation at home -Given episode of A-fib with RVR, resuming Toprol and will monitor blood pressure for tolerance -Continue amiodarone  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) - continue SSI and CBG monitoring   Chronic diastolic CHF (congestive heart failure) (HCC) - no s/s exac  Parkinson disease (HCC) - Continue Requip - Resume amantadine  Exocrine pancreatic insufficiency - continue creon     Old  records reviewed in assessment of this patient  Antimicrobials: Cefepime 11/5 x 1 Rocephin 11/5 >> current   DVT prophylaxis:  Place and maintain sequential compression device Start: 12/24/22 1624   Code Status:   Code Status: Full Code  Mobility Assessment (Last 72 Hours)     Mobility Assessment     Row Name 12/28/22 0744 12/27/22 2000 12/27/22 1811 12/27/22 0800 12/26/22 2140   Does patient have an order for bedrest or is patient medically unstable No - Continue assessment No - Continue assessment No - Continue assessment No - Continue assessment No - Continue assessment   What is the highest level of mobility based on the progressive mobility assessment? Level 4 (Walks with assist in room) - Balance while marching in place and cannot step forward and back - Complete Level 4 (Walks with assist in room) - Balance while marching in place and cannot step forward and back - Complete Level 4 (Walks with assist in room) - Balance while marching in place and cannot step forward and back - Complete Level 5 (Walks with assist in room/hall) - Balance while stepping forward/back and can walk in room with assist - Complete Level 5 (Walks with assist in room/hall) - Balance while stepping forward/back and can walk in room with assist - Complete    Row Name 12/26/22 1400 12/26/22 0830 12/25/22 1501       Does patient have an order for bedrest or is patient medically unstable -- No - Continue assessment --     What is the highest level of mobility based on the progressive mobility  assessment? Level 5 (Walks with assist in room/hall) - Balance while stepping forward/back and can walk in room with assist - Complete Level 5 (Walks with assist in room/hall) - Balance while stepping forward/back and can walk in room with assist - Complete Level 5 (Walks with assist in room/hall) - Balance while stepping forward/back and can walk in room with assist - Complete              Barriers to discharge:  none Disposition Plan:  Pending PT/OT evals Status is: Inpt  Objective: Blood pressure (!) 142/55, pulse (!) 58, temperature 97.8 F (36.6 C), temperature source Oral, resp. rate (!) 22, height 5\' 3"  (1.6 m), weight 55 kg, SpO2 98%.  Examination:  Physical Exam Constitutional:      Appearance: Normal appearance.  HENT:     Head: Normocephalic and atraumatic.     Mouth/Throat:     Mouth: Mucous membranes are moist.  Eyes:     Extraocular Movements: Extraocular movements intact.  Cardiovascular:     Rate and Rhythm: Normal rate and regular rhythm.  Pulmonary:     Effort: Pulmonary effort is normal. No respiratory distress.     Breath sounds: Normal breath sounds. No wheezing.  Abdominal:     General: Bowel sounds are normal. There is no distension.     Palpations: Abdomen is soft.     Tenderness: There is no abdominal tenderness.  Musculoskeletal:     Cervical back: Normal range of motion and neck supple.     Comments: Some chronic erythema and skin changes noted in RLE but worse in LLE with blood blister appreciated and surrounding erythema and mild calor (area also TTP).  Overall has had significant improvement  Skin:    General: Skin is warm and dry.  Neurological:     General: No focal deficit present.     Mental Status: She is alert.  Psychiatric:        Mood and Affect: Mood normal.      Consultants:  Nephrology   Procedures:    Data Reviewed: Results for orders placed or performed during the hospital encounter of 12/23/22 (from the past 24 hour(s))  CBC     Status: Abnormal   Collection Time: 12/27/22  2:39 PM  Result Value Ref Range   WBC 12.1 (H) 4.0 - 10.5 K/uL   RBC 2.51 (L) 3.87 - 5.11 MIL/uL   Hemoglobin 7.8 (L) 12.0 - 15.0 g/dL   HCT 40.1 (L) 02.7 - 25.3 %   MCV 101.6 (H) 80.0 - 100.0 fL   MCH 31.1 26.0 - 34.0 pg   MCHC 30.6 30.0 - 36.0 g/dL   RDW 66.4 40.3 - 47.4 %   Platelets 174 150 - 400 K/uL   nRBC 0.3 (H) 0.0 - 0.2 %  Glucose, capillary      Status: Abnormal   Collection Time: 12/27/22  4:04 PM  Result Value Ref Range   Glucose-Capillary 116 (H) 70 - 99 mg/dL  Glucose, capillary     Status: Abnormal   Collection Time: 12/27/22  9:02 PM  Result Value Ref Range   Glucose-Capillary 156 (H) 70 - 99 mg/dL  Magnesium     Status: None   Collection Time: 12/28/22  2:18 AM  Result Value Ref Range   Magnesium 2.1 1.7 - 2.4 mg/dL  Renal function panel     Status: Abnormal   Collection Time: 12/28/22  2:18 AM  Result Value Ref Range   Sodium 134 (L)  135 - 145 mmol/L   Potassium 4.1 3.5 - 5.1 mmol/L   Chloride 95 (L) 98 - 111 mmol/L   CO2 28 22 - 32 mmol/L   Glucose, Bld 125 (H) 70 - 99 mg/dL   BUN 19 8 - 23 mg/dL   Creatinine, Ser 6.06 (H) 0.44 - 1.00 mg/dL   Calcium 9.2 8.9 - 30.1 mg/dL   Phosphorus 4.9 (H) 2.5 - 4.6 mg/dL   Albumin 2.1 (L) 3.5 - 5.0 g/dL   GFR, Estimated 18 (L) >60 mL/min   Anion gap 11 5 - 15  CBC with Differential/Platelet     Status: Abnormal   Collection Time: 12/28/22  2:18 AM  Result Value Ref Range   WBC 8.2 4.0 - 10.5 K/uL   RBC 2.35 (L) 3.87 - 5.11 MIL/uL   Hemoglobin 7.5 (L) 12.0 - 15.0 g/dL   HCT 60.1 (L) 09.3 - 23.5 %   MCV 104.3 (H) 80.0 - 100.0 fL   MCH 31.9 26.0 - 34.0 pg   MCHC 30.6 30.0 - 36.0 g/dL   RDW 57.3 22.0 - 25.4 %   Platelets 182 150 - 400 K/uL   nRBC 0.5 (H) 0.0 - 0.2 %   Neutrophils Relative % 71 %   Neutro Abs 5.9 1.7 - 7.7 K/uL   Lymphocytes Relative 19 %   Lymphs Abs 1.5 0.7 - 4.0 K/uL   Monocytes Relative 7 %   Monocytes Absolute 0.6 0.1 - 1.0 K/uL   Eosinophils Relative 2 %   Eosinophils Absolute 0.2 0.0 - 0.5 K/uL   Basophils Relative 0 %   Basophils Absolute 0.0 0.0 - 0.1 K/uL   Immature Granulocytes 1 %   Abs Immature Granulocytes 0.05 0.00 - 0.07 K/uL  Glucose, capillary     Status: Abnormal   Collection Time: 12/28/22  5:51 AM  Result Value Ref Range   Glucose-Capillary 119 (H) 70 - 99 mg/dL  Glucose, capillary     Status: Abnormal   Collection Time:  12/28/22 12:07 PM  Result Value Ref Range   Glucose-Capillary 170 (H) 70 - 99 mg/dL    I have reviewed pertinent nursing notes, vitals, labs, and images as necessary. I have ordered labwork to follow up on as indicated.  I have reviewed the last notes from staff over past 24 hours. I have discussed patient's care plan and test results with nursing staff, CM/SW, and other staff as appropriate.  Time spent: Greater than 50% of the 55 minute visit was spent in counseling/coordination of care for the patient as laid out in the A&P.   LOS: 4 days   Lewie Chamber, MD [facility] 12/28/2022, 1:47 PM

## 2022-12-28 NOTE — Plan of Care (Signed)
  Problem: Education: Goal: Knowledge of General Education information will improve Description: Including pain rating scale, medication(s)/side effects and non-pharmacologic comfort measures Outcome: Progressing   Problem: Health Behavior/Discharge Planning: Goal: Ability to manage health-related needs will improve Outcome: Progressing   Problem: Clinical Measurements: Goal: Ability to maintain clinical measurements within normal limits will improve Outcome: Progressing   Problem: Activity: Goal: Risk for activity intolerance will decrease Outcome: Progressing   Problem: Skin Integrity: Goal: Risk for impaired skin integrity will decrease Outcome: Progressing   

## 2022-12-28 NOTE — Progress Notes (Signed)
Patient's cardiac rhythm converted to sinus brady from atrial fibrillation around 0100. EKG confirmation in chart.

## 2022-12-29 DIAGNOSIS — I959 Hypotension, unspecified: Secondary | ICD-10-CM | POA: Diagnosis not present

## 2022-12-29 DIAGNOSIS — L03116 Cellulitis of left lower limb: Secondary | ICD-10-CM | POA: Diagnosis not present

## 2022-12-29 DIAGNOSIS — N186 End stage renal disease: Secondary | ICD-10-CM | POA: Diagnosis not present

## 2022-12-29 DIAGNOSIS — N99528 Other complication of other external stoma of urinary tract: Secondary | ICD-10-CM | POA: Diagnosis not present

## 2022-12-29 LAB — RENAL FUNCTION PANEL
Albumin: 1.8 g/dL — ABNORMAL LOW (ref 3.5–5.0)
Anion gap: 17 — ABNORMAL HIGH (ref 5–15)
BUN: 28 mg/dL — ABNORMAL HIGH (ref 8–23)
CO2: 20 mmol/L — ABNORMAL LOW (ref 22–32)
Calcium: 9.3 mg/dL (ref 8.9–10.3)
Chloride: 95 mmol/L — ABNORMAL LOW (ref 98–111)
Creatinine, Ser: 3.32 mg/dL — ABNORMAL HIGH (ref 0.44–1.00)
GFR, Estimated: 13 mL/min — ABNORMAL LOW (ref >60)
Glucose, Bld: 120 mg/dL — ABNORMAL HIGH (ref 70–99)
Phosphorus: 5.6 mg/dL — ABNORMAL HIGH (ref 2.5–4.6)
Potassium: 4.9 mmol/L (ref 3.5–5.1)
Sodium: 132 mmol/L — ABNORMAL LOW (ref 135–145)

## 2022-12-29 LAB — CULTURE, BLOOD (ROUTINE X 2)
Culture: NO GROWTH
Culture: NO GROWTH
Special Requests: ADEQUATE
Special Requests: ADEQUATE

## 2022-12-29 LAB — GLUCOSE, CAPILLARY
Glucose-Capillary: 158 mg/dL — ABNORMAL HIGH (ref 70–99)
Glucose-Capillary: 98 mg/dL (ref 70–99)
Glucose-Capillary: 145 mg/dL — ABNORMAL HIGH (ref 70–99)
Glucose-Capillary: 178 mg/dL — ABNORMAL HIGH (ref 70–99)

## 2022-12-29 LAB — MAGNESIUM: Magnesium: 2.3 mg/dL (ref 1.7–2.4)

## 2022-12-29 MED ORDER — MIDODRINE HCL 5 MG PO TABS
10.0000 mg | ORAL_TABLET | ORAL | Status: DC | PRN
  Administered 2022-12-30: 10 mg via ORAL
  Filled 2022-12-29: qty 2

## 2022-12-29 MED ORDER — METOPROLOL SUCCINATE ER 25 MG PO TB24
12.5000 mg | ORAL_TABLET | ORAL | Status: DC
  Administered 2022-12-31 – 2023-01-02 (×2): 12.5 mg via ORAL
  Filled 2022-12-29 (×3): qty 1

## 2022-12-29 MED ORDER — CHLORHEXIDINE GLUCONATE CLOTH 2 % EX PADS
6.0000 | MEDICATED_PAD | Freq: Every day | CUTANEOUS | Status: DC
  Administered 2022-12-29 – 2022-12-31 (×3): 6 via TOPICAL

## 2022-12-29 MED ORDER — ONDANSETRON HCL 4 MG/2ML IJ SOLN
4.0000 mg | Freq: Four times a day (QID) | INTRAMUSCULAR | Status: DC | PRN
  Administered 2022-12-29: 4 mg via INTRAVENOUS
  Filled 2022-12-29: qty 2

## 2022-12-29 NOTE — Progress Notes (Signed)
Progress Note    Kim Burns   DGU:[id number]  DOB: 14-Mar-1935  DOA: 12/23/2022     5 PCP: Kim Moccasin, MD  Initial CC: hypotension with HD and LLE cellulitis   Hospital Course: Kim Burns is an 87 y.o. Female with ESRD on HD MWF, DM, RCC s/p ablation, IPMN s/p Whipple, PAF not on AC, dCHF, hx CVA and OSA not on CPAP who presented with worsening hypotension with dialysis and left leg cellulitis.  Interval History:  Felt dizzy and nauseous this morning along with a headache.  Had been sitting in recliner approximately 30 minutes.  Telemetry showed NSR with rate in the 60s.  Blood pressure also normotensive. She was requesting Tylenol and Zofran.  Assessment and Plan: * Cellulitis of left lower extremity - no purulent drainage; some bruising noted - continue rocephin; seems to be improved from admission   Hypotension - On midodrine on dialysis days and pressures are improved  Nephrostomy complication (HCC) - Patient complaining of worsening discomfort around left nephrostomy tube site.  Also noted to have leaking from drainage site when tube being flushed -Patient states was recently replaced on 12/16/2022 - Will ask for IR to help with evaluating in case of malpositioning -Tentative plan for tube exchange on 12/30/2022  End-stage renal disease on hemodialysis (HCC) - on HD MWF - continue as per nephrology   Hypoalbuminemia - s/p albumin  Elevated LFTs - Hepatic steatosis noted on RUQ ultrasound; hx of CCY - Trend LFTs intermittently  Paroxysmal atrial fibrillation (HCC) - Not on anticoagulation at home -Given episode of A-fib with RVR, resuming Toprol and will monitor blood pressure for tolerance (change to non-HD days) -Continue amiodarone  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) - continue SSI and CBG monitoring   Chronic diastolic CHF (congestive heart failure) (HCC) - no s/s exac  Parkinson disease (HCC) - Continue Requip - Resume  amantadine  Exocrine pancreatic insufficiency - continue creon     Old records reviewed in assessment of this patient  Antimicrobials: Cefepime 11/5 x 1 Rocephin 11/5 >> current   DVT prophylaxis:  Place and maintain sequential compression device Start: 12/24/22 1624   Code Status:   Code Status: Full Code  Mobility Assessment (Last 72 Hours)     Mobility Assessment     Row Name 12/29/22 1000 12/28/22 2300 12/28/22 1600 12/28/22 1400 12/28/22 0744   Does patient have an order for bedrest or is patient medically unstable No - Continue assessment No - Continue assessment -- No - Continue assessment No - Continue assessment   What is the highest level of mobility based on the progressive mobility assessment? Level 2 (Chairfast) - Balance while sitting on edge of bed and cannot stand Level 4 (Walks with assist in room) - Balance while marching in place and cannot step forward and back - Complete Level 4 (Walks with assist in room) - Balance while marching in place and cannot step forward and back - Complete Level 4 (Walks with assist in room) - Balance while marching in place and cannot step forward and back - Complete Level 4 (Walks with assist in room) - Balance while marching in place and cannot step forward and back - Complete   Is the above level different from baseline mobility prior to current illness? Yes - Recommend PT order -- -- -- --    Row Name 12/27/22 2000 12/27/22 1811 12/27/22 0800 12/26/22 2140 12/26/22 1400   Does patient have an order for bedrest or  is patient medically unstable No - Continue assessment No - Continue assessment No - Continue assessment No - Continue assessment --   What is the highest level of mobility based on the progressive mobility assessment? Level 4 (Walks with assist in room) - Balance while marching in place and cannot step forward and back - Complete Level 4 (Walks with assist in room) - Balance while marching in place and cannot step forward  and back - Complete Level 5 (Walks with assist in room/hall) - Balance while stepping forward/back and can walk in room with assist - Complete Level 5 (Walks with assist in room/hall) - Balance while stepping forward/back and can walk in room with assist - Complete Level 5 (Walks with assist in room/hall) - Balance while stepping forward/back and can walk in room with assist - Complete            Barriers to discharge: none Disposition Plan:  Pending PT/OT evals Status is: Inpt  Objective: Blood pressure (!) 168/67, pulse 87, temperature 98 F (36.7 C), temperature source Oral, resp. rate 16, height 5\' 3"  (1.6 m), weight 55 kg, SpO2 98%.  Examination:  Physical Exam Constitutional:      Appearance: Normal appearance.  HENT:     Head: Normocephalic and atraumatic.     Mouth/Throat:     Mouth: Mucous membranes are moist.  Eyes:     Extraocular Movements: Extraocular movements intact.  Cardiovascular:     Rate and Rhythm: Normal rate and regular rhythm.  Pulmonary:     Effort: Pulmonary effort is normal. No respiratory distress.     Breath sounds: Normal breath sounds. No wheezing.  Abdominal:     General: Bowel sounds are normal. There is no distension.     Palpations: Abdomen is soft.     Tenderness: There is no abdominal tenderness.  Musculoskeletal:     Cervical back: Normal range of motion and neck supple.     Comments: Some chronic erythema and skin changes noted in RLE but worse in LLE with blood blister appreciated and surrounding erythema and mild calor (area also TTP).  Overall has had significant improvement  Skin:    General: Skin is warm and dry.  Neurological:     General: No focal deficit present.     Mental Status: She is alert.  Psychiatric:        Mood and Affect: Mood normal.      Consultants:  Nephrology   Procedures:    Data Reviewed: Results for orders placed or performed during the hospital encounter of 12/23/22 (from the past 24 hour(s))   Glucose, capillary     Status: Abnormal   Collection Time: 12/28/22  3:01 PM  Result Value Ref Range   Glucose-Capillary 163 (H) 70 - 99 mg/dL  Glucose, capillary     Status: Abnormal   Collection Time: 12/28/22  8:43 PM  Result Value Ref Range   Glucose-Capillary 138 (H) 70 - 99 mg/dL  Renal function panel     Status: Abnormal   Collection Time: 12/29/22  5:00 AM  Result Value Ref Range   Sodium 132 (L) 135 - 145 mmol/L   Potassium 4.9 3.5 - 5.1 mmol/L   Chloride 95 (L) 98 - 111 mmol/L   CO2 20 (L) 22 - 32 mmol/L   Glucose, Bld 120 (H) 70 - 99 mg/dL   BUN 28 (H) 8 - 23 mg/dL   Creatinine, Ser 2.35 (H) 0.44 - 1.00 mg/dL   Calcium 9.3  8.9 - 10.3 mg/dL   Phosphorus 5.6 (H) 2.5 - 4.6 mg/dL   Albumin 1.8 (L) 3.5 - 5.0 g/dL   GFR, Estimated 13 (L) >60 mL/min   Anion gap 17 (H) 5 - 15  Magnesium     Status: None   Collection Time: 12/29/22  5:00 AM  Result Value Ref Range   Magnesium 2.3 1.7 - 2.4 mg/dL  Glucose, capillary     Status: Abnormal   Collection Time: 12/29/22  7:10 AM  Result Value Ref Range   Glucose-Capillary 158 (H) 70 - 99 mg/dL  Glucose, capillary     Status: None   Collection Time: 12/29/22 11:19 AM  Result Value Ref Range   Glucose-Capillary 98 70 - 99 mg/dL    I have reviewed pertinent nursing notes, vitals, labs, and images as necessary. I have ordered labwork to follow up on as indicated.  I have reviewed the last notes from staff over past 24 hours. I have discussed patient's care plan and test results with nursing staff, CM/SW, and other staff as appropriate.  Time spent: Greater than 50% of the 55 minute visit was spent in counseling/coordination of care for the patient as laid out in the A&P.   LOS: 5 days   Lewie Chamber, MD [facility] 12/29/2022, 1:11 PM

## 2022-12-29 NOTE — Progress Notes (Signed)
   12/29/22 1021  Mobility  Activity Transferred from bed to chair  Level of Assistance Moderate assist, patient does 50-74% (MaxA STS)  Assistive Device Other (Comment) (HHA)  Distance Ambulated (ft) 2 ft  Activity Response Tolerated fair  Mobility Referral Yes  $Mobility charge 1 Mobility  Mobility Specialist Start Time (ACUTE ONLY) 0940  Mobility Specialist Stop Time (ACUTE ONLY) 1021  Mobility Specialist Time Calculation (min) (ACUTE ONLY) 41 min   Mobility Specialist: Progress Note  Pre-Mobility: HR 63, BP 149/59 (85) During Mobility (Sitting EOB): HR 69, BP 156/65 (92)  Post-Mobility: HR 62, BP 155/59 (84),    Pt agreeable to mobility session - received in bed. Required MaxA+2 STS, ModA for transfer using HHA.  C/o dizziness with movement. Blood from RUE on cuff after BP was taken, blood noticed on SCDs - RN notified. Returned to bed with all needs met - call bell within reach. Chair alarm on.   Kim Burns, BS Mobility Specialist Please contact via SecureChat or Rehab office at [phone].

## 2022-12-29 NOTE — Plan of Care (Signed)

## 2022-12-29 NOTE — Progress Notes (Signed)
[facility] Progress Note   Subjective:   Patient seen and examined at bedside.  A fib improved and moved out of step down.  Denies CP, palpitations, SOB, edema, orthopnea, weakness and fatigue.  Admits to getting some of her days confused. Reports refusing blood draws due to it taking multiple attempts and soreness to her arm.    Objective Vitals:   12/28/22 1416 12/28/22 2040 12/29/22 0505 12/29/22 0716  BP: (!) 138/57 (!) 151/63 (!) 159/66 (!) 168/67  Pulse: (!) 59 60 65 87  Resp:  18 18 16   Temp: (!) 97.5 F (36.4 C) 97.8 F (36.6 C) 97.7 F (36.5 C) 98 F (36.7 C)  TempSrc:  Oral  Oral  SpO2: 95% (!) 79% 96% 98%  Weight:      Height:       Physical Exam General:chronically ill appearing female in NAD Heart:RRR, no mrg Lungs:CTAB, nml WOB on RA Abdomen:soft, NTND Extremities:1+ LE edema Dialysis Access: LU AVF +b/t   Filed Weights   12/25/22 1249 12/27/22 0827 12/27/22 1235  Weight: 56 kg 56.4 kg 55 kg    Intake/Output Summary (Last 24 hours) at 12/29/2022 0920 Last data filed at 12/29/2022 0900 Gross per 24 hour  Intake 360 ml  Output 1 ml  Net 359 ml    Additional Objective Labs: Basic Metabolic Panel: Recent Labs  Lab 12/27/22 0542 12/28/22 0218 12/29/22 0500  NA 135 134* 132*  K 4.4 4.1 4.9  CL 98 95* 95*  CO2 22 28 20*  GLUCOSE 133* 125* 120*  BUN 38* 19 28*  CREATININE 3.85* 2.53* 3.32*  CALCIUM 8.9 9.2 9.3  PHOS 6.2* 4.9* 5.6*   Liver Function Tests: Recent Labs  Lab 12/24/22 0445 12/25/22 0538 12/26/22 0537 12/27/22 0542 12/28/22 0218 12/29/22 0500  AST 52* 22 16  --   --   --   ALT 90* 63* 44  --   --   --   ALKPHOS 112 103 82  --   --   --   BILITOT 0.6 0.6 0.6  --   --   --   PROT 4.2* 4.1* 4.2*  --   --   --   ALBUMIN 1.7* 1.6* 1.9* 1.9* 2.1* 1.8*   CBC: Recent Labs  Lab 12/23/22 1916 12/24/22 0445 12/25/22 0538 12/26/22 0537 12/27/22 0542 12/27/22 1439 12/28/22 0218  WBC 9.1   < > 8.9 10.3 10.2  12.1* 8.2  NEUTROABS 7.5  --   --   --  8.1*  --  5.9  HGB 8.6*   < > 7.3* 7.1* 7.6* 7.8* 7.5*  HCT 28.0*   < > 23.8* 22.8* 24.2* 25.5* 24.5*  MCV 104.1*   < > 102.1* 100.4* 100.4* 101.6* 104.3*  PLT 178   < > 161 155 156 174 182   < > = values in this interval not displayed.    Recent Labs  Lab 12/28/22 0551 12/28/22 1207 12/28/22 1501 12/28/22 2043 12/29/22 0710  GLUCAP 119* 170* 163* 138* 158*    Medications:  cefTRIAXone (ROCEPHIN)  IV 2 g (12/28/22 1651)    amantadine  100 mg Oral Weekly   amiodarone  200 mg Oral Daily   aspirin  81 mg Oral Daily   Chlorhexidine Gluconate Cloth  6 each Topical Q0600   darbepoetin (ARANESP) injection - DIALYSIS  100 mcg Subcutaneous Q Wed-1800   doxercalciferol  6 mcg Intravenous Q M,W,F-HD   feeding supplement  237 mL Oral BID BM  insulin aspart  0-5 Units Subcutaneous QHS   insulin aspart  0-6 Units Subcutaneous TID WC   lipase/protease/amylase  72,000 Units Oral TID WC   metoprolol succinate  12.5 mg Oral Daily   midodrine  10 mg Oral Q M,W,F   rOPINIRole  0.25 mg Oral q morning   And   rOPINIRole  0.5 mg Oral QHS   sevelamer carbonate  800 mg Oral TID WC   sodium chloride flush  3 mL Intravenous Q12H    Dialysis Orders: [facility]: [facility]  on MWF . 160NRe 3 hr 30 min BFR 350 DFR Auto 1.5 EDW 55kg 2K 2Ca AVF 16g  No heparin Mircera IV q 2 weeks- has not received ESA recently  Hectorol IV q HD   Assessment/Plan:  Lower leg cellulitis: improving with antibiotics.   ESRD:  On MWF schedule. HD tomorrow per regular schedule.  A fib - noted after HD on Friday. Possibly provoked by acute on chronic hypotension +ultrafiltration w/HD. Now back in NSR. On amiodarone and metoprolol.  Nephrostomy tube - plan for exchange deferred d/t A fib.  Likely to be completed early this coming week.   Hypertension/volume: BP has been dropping on HD despite midodrine. Takes 10mg  pre-HD and occasionally an extra dose mid HD. Will continue  midodrine and add prn order for 1/2 through treatment. As well as PRN albumin with HD for BP support.  LE edema on exam.  UF limited by hypotension.  Continue UF as tolerated.   Anemia: last Hgb 7.5, unable to give back blood with HD on Friday due to +chlorine test. Continue aranesp qWed.  Metabolic bone disease: Corrected calcium ok. Phos mildly elevated.  Continue binders and VDRA.   Nutrition:  Alb 1.8, will need renal diet and protein supplements Recent TBI/CIR admission: improved but reports she is still weak. Gets PT at home. Holding heparin with HD.  Virgina Norfolk, PA-C [facility] 12/29/2022,9:20 AM  LOS: 5 days

## 2022-12-30 ENCOUNTER — Inpatient Hospital Stay (HOSPITAL_COMMUNITY): Payer: Medicare PPO

## 2022-12-30 DIAGNOSIS — L03116 Cellulitis of left lower limb: Secondary | ICD-10-CM | POA: Diagnosis not present

## 2022-12-30 DIAGNOSIS — I959 Hypotension, unspecified: Secondary | ICD-10-CM | POA: Diagnosis not present

## 2022-12-30 DIAGNOSIS — N186 End stage renal disease: Secondary | ICD-10-CM | POA: Diagnosis not present

## 2022-12-30 DIAGNOSIS — Z992 Dependence on renal dialysis: Secondary | ICD-10-CM | POA: Diagnosis not present

## 2022-12-30 HISTORY — PX: IR NEPHROSTOMY EXCHANGE LEFT: IMG6069

## 2022-12-30 LAB — CBC WITH DIFFERENTIAL/PLATELET
Abs Immature Granulocytes: 0.07 10*3/uL (ref 0.00–0.07)
Basophils Absolute: 0 10*3/uL (ref 0.0–0.1)
Basophils Relative: 0 %
Eosinophils Absolute: 0.2 10*3/uL (ref 0.0–0.5)
Eosinophils Relative: 2 %
HCT: 24.4 % — ABNORMAL LOW (ref 36.0–46.0)
Hemoglobin: 7.4 g/dL — ABNORMAL LOW (ref 12.0–15.0)
Immature Granulocytes: 1 %
Lymphocytes Relative: 10 %
Lymphs Abs: 0.9 10*3/uL (ref 0.7–4.0)
MCH: 31.6 pg (ref 26.0–34.0)
MCHC: 30.3 g/dL (ref 30.0–36.0)
MCV: 104.3 fL — ABNORMAL HIGH (ref 80.0–100.0)
Monocytes Absolute: 0.6 10*3/uL (ref 0.1–1.0)
Monocytes Relative: 7 %
Neutro Abs: 7 10*3/uL (ref 1.7–7.7)
Neutrophils Relative %: 80 %
Platelets: 249 10*3/uL (ref 150–400)
RBC: 2.34 MIL/uL — ABNORMAL LOW (ref 3.87–5.11)
RDW: 15.8 % — ABNORMAL HIGH (ref 11.5–15.5)
WBC: 8.7 10*3/uL (ref 4.0–10.5)
nRBC: 1.5 % — ABNORMAL HIGH (ref 0.0–0.2)

## 2022-12-30 LAB — GLUCOSE, CAPILLARY
Glucose-Capillary: 86 mg/dL (ref 70–99)
Glucose-Capillary: 93 mg/dL (ref 70–99)
Glucose-Capillary: 112 mg/dL — ABNORMAL HIGH (ref 70–99)
Glucose-Capillary: 57 mg/dL — ABNORMAL LOW (ref 70–99)
Glucose-Capillary: 65 mg/dL — ABNORMAL LOW (ref 70–99)
Glucose-Capillary: 51 mg/dL — ABNORMAL LOW (ref 70–99)
Glucose-Capillary: 98 mg/dL (ref 70–99)
Glucose-Capillary: 103 mg/dL — ABNORMAL HIGH (ref 70–99)

## 2022-12-30 LAB — CBC
HCT: 23 % — ABNORMAL LOW (ref 36.0–46.0)
Hemoglobin: 7.3 g/dL — ABNORMAL LOW (ref 12.0–15.0)
MCH: 33.3 pg (ref 26.0–34.0)
MCHC: 31.7 g/dL (ref 30.0–36.0)
MCV: 105 fL — ABNORMAL HIGH (ref 80.0–100.0)
Platelets: 247 10*3/uL (ref 150–400)
RBC: 2.19 MIL/uL — ABNORMAL LOW (ref 3.87–5.11)
RDW: 15.9 % — ABNORMAL HIGH (ref 11.5–15.5)
WBC: 8.5 10*3/uL (ref 4.0–10.5)
nRBC: 1.5 % — ABNORMAL HIGH (ref 0.0–0.2)

## 2022-12-30 LAB — RENAL FUNCTION PANEL
Albumin: 2.1 g/dL — ABNORMAL LOW (ref 3.5–5.0)
Anion gap: 11 (ref 5–15)
BUN: 30 mg/dL — ABNORMAL HIGH (ref 8–23)
CO2: 26 mmol/L (ref 22–32)
Calcium: 9.3 mg/dL (ref 8.9–10.3)
Chloride: 93 mmol/L — ABNORMAL LOW (ref 98–111)
Creatinine, Ser: 4.02 mg/dL — ABNORMAL HIGH (ref 0.44–1.00)
GFR, Estimated: 10 mL/min — ABNORMAL LOW (ref >60)
Glucose, Bld: 112 mg/dL — ABNORMAL HIGH (ref 70–99)
Phosphorus: 5.9 mg/dL — ABNORMAL HIGH (ref 2.5–4.6)
Potassium: 4.1 mmol/L (ref 3.5–5.1)
Sodium: 130 mmol/L — ABNORMAL LOW (ref 135–145)

## 2022-12-30 LAB — MAGNESIUM: Magnesium: 2.5 mg/dL — ABNORMAL HIGH (ref 1.7–2.4)

## 2022-12-30 MED ORDER — PENTAFLUOROPROP-TETRAFLUOROETH EX AERO: 1.0000 | INHALATION_SPRAY | CUTANEOUS | Status: DC | PRN

## 2022-12-30 MED ORDER — DOXERCALCIFEROL 4 MCG/2ML IV SOLN
4.0000 ug | INTRAVENOUS | Status: DC
  Administered 2022-12-30 – 2023-01-02 (×2): 4 ug via INTRAVENOUS
  Filled 2022-12-30 (×2): qty 2

## 2022-12-30 MED ORDER — LIDOCAINE HCL 1 % IJ SOLN
INTRAMUSCULAR | Status: AC
  Filled 2022-12-30: qty 20

## 2022-12-30 MED ORDER — GLUCOSE 4 G PO CHEW
CHEWABLE_TABLET | ORAL | Status: AC
  Filled 2022-12-30: qty 1

## 2022-12-30 MED ORDER — HEPARIN SODIUM (PORCINE) 1000 UNIT/ML DIALYSIS: 1000.0000 [IU] | INTRAMUSCULAR | Status: DC | PRN

## 2022-12-30 MED ORDER — ALBUMIN HUMAN 25 % IV SOLN
25.0000 g | Freq: Once | INTRAVENOUS | Status: AC
  Administered 2022-12-30: 25 g via INTRAVENOUS

## 2022-12-30 MED ORDER — LIDOCAINE HCL (PF) 1 % IJ SOLN
5.0000 mL | INTRAMUSCULAR | Status: DC | PRN
Start: 2022-12-30 — End: 2022-12-30

## 2022-12-30 MED ORDER — ANTICOAGULANT SODIUM CITRATE 4% (200MG/5ML) IV SOLN: 5.0000 mL | Status: DC | PRN

## 2022-12-30 MED ORDER — LIDOCAINE HCL 1 % IJ SOLN
20.0000 mL | Freq: Once | INTRAMUSCULAR | Status: AC
  Administered 2022-12-30: 10 mL via INTRADERMAL

## 2022-12-30 MED ORDER — ACETAMINOPHEN 500 MG PO TABS: 1000.0000 mg | ORAL_TABLET | Freq: Four times a day (QID) | ORAL | Status: DC | PRN

## 2022-12-30 MED ORDER — LIDOCAINE-PRILOCAINE 2.5-2.5 % EX CREA: 1.0000 | TOPICAL_CREAM | CUTANEOUS | Status: DC | PRN

## 2022-12-30 MED ORDER — ALTEPLASE 2 MG IJ SOLR: 2.0000 mg | Freq: Once | INTRAMUSCULAR | Status: DC | PRN

## 2022-12-30 MED ORDER — LIDOCAINE HCL 1 % IJ SOLN
INTRAMUSCULAR | Status: AC
Start: 2022-12-30 — End: ?
  Filled 2022-12-30: qty 20

## 2022-12-30 MED ORDER — IOHEXOL 300 MG/ML  SOLN
10.0000 mL | Freq: Once | INTRAMUSCULAR | Status: AC | PRN
  Administered 2022-12-30: 10 mL

## 2022-12-30 NOTE — Progress Notes (Signed)
PT Cancellation Note  Patient Details Name: Kim Burns MRN: [id number] DOB: 1935-06-02   Cancelled Treatment:    Reason Eval/Treat Not Completed: Patient at procedure or test/unavailable  Patinet off the floor.    Jerolyn Center, PT Acute Rehabilitation Services  Office [phone]   Zena Amos 12/30/2022, 9:33 AM

## 2022-12-30 NOTE — Progress Notes (Signed)
Mobility Specialist: Progress Note   12/30/22 1217  Mobility  Activity Transferred from bed to chair;Transferred from chair to bed  Level of Assistance Moderate assist, patient does 50-74%  Assistive Device Front wheel walker  Distance Ambulated (ft) 5 ft  Activity Response Tolerated fair  Mobility Referral Yes  $Mobility charge 1 Mobility  Mobility Specialist Start Time (ACUTE ONLY) 1143  Mobility Specialist Stop Time (ACUTE ONLY) 1210  Mobility Specialist Time Calculation (min) (ACUTE ONLY) 27 min    Pt was somewhat agreeable to mobility - received in bed. Agitated and confused today, kept stating it was time for her to leave, people were waiting for her, and only agreeable to mobility if she was going home soon. MinA for bed mobility to assist with scoot and trunk elevation, ModA for STS, minG for ambulation to/from chair. Had to transfer pt back to bed because she was heading to dialysis. No complaints just very frustrated with MS about going home. Left in bed with all needs met, call bell in reach. Transport heading to room.   Maurene Capes Mobility Specialist Please contact via SecureChat or Rehab office at [phone]

## 2022-12-30 NOTE — Progress Notes (Signed)
Brief Palliative Medicine Progress Note:  PMT following peripherally for needs/decline:  Medical records reviewed including progress notes, labs, imaging. Patient remains inpatient for IV antibiotics for LLE cellulitis as well as having nephrostomy tube complications.  Goals are clear for treating the treatable and discharge home with [facility] with outpatient Palliative Care to follow. She wishes to remain a full code; however, would not want long term life support (trial of 2-3 days). PMT will continue to follow peripherally. If there are any imminent needs please call the service directly. Family also has PMT contact information should further needs arise.  Thank you for allowing PMT to assist in the care of this patient.  Talayah Picardi M. Katrinka Blazing [facility] Palliative Medicine Team Team Phone: [phone] NO CHARGE

## 2022-12-30 NOTE — Progress Notes (Signed)
Progress Note    Kim Burns   [id number]  DOB: 1935/10/18  DOA: 12/23/2022     6 PCP: Kim Moccasin, MD  Initial CC: hypotension with HD and LLE cellulitis   Hospital Course: Kim Burns is an 87 y.o. Female with ESRD on HD MWF, DM, RCC s/p ablation, IPMN s/p Whipple, PAF not on AC, dCHF, hx CVA and OSA not on CPAP who presented with worsening hypotension with dialysis and left leg cellulitis.  Interval History:  Confused this morning. Seems to wax and wane some. Nephrostomy tube exchanged this morning.   Assessment and Plan: * Cellulitis of left lower extremity - no purulent drainage; some bruising noted - continue rocephin x 7 days; seems to be improved from admission   Hypotension - On midodrine on dialysis days and pressures are improved  Nephrostomy complication (HCC) - Patient complaining of worsening discomfort around left nephrostomy tube site.  Also noted to have leaking from drainage site when tube being flushed -Patient states was recently replaced on 12/16/2022 - found to be partially retracted; exchanged by IR on 11/11; next exchange in 6 weeks outpatient  End-stage renal disease on hemodialysis (HCC) - on HD MWF - continue as per nephrology   Hypoalbuminemia - s/p albumin  Elevated LFTs - Hepatic steatosis noted on RUQ ultrasound; hx of CCY - Trend LFTs intermittently  Paroxysmal atrial fibrillation (HCC) - Not on anticoagulation at home -Given episode of A-fib with RVR, resuming Toprol and will monitor blood pressure for tolerance (change to non-HD days) -Continue amiodarone  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) - continue SSI and CBG monitoring   Chronic diastolic CHF (congestive heart failure) (HCC) - no s/s exac  Parkinson disease (HCC) - Continue Requip - Resume amantadine  Exocrine pancreatic insufficiency - continue creon     Old records reviewed in assessment of this patient  Antimicrobials: Cefepime  11/5 x 1 Rocephin 11/5 >> current   DVT prophylaxis:  Place and maintain sequential compression device Start: 12/24/22 1624   Code Status:   Code Status: Full Code  Mobility Assessment (Last 72 Hours)     Mobility Assessment     Row Name 12/30/22 1000 12/30/22 0104 12/29/22 1000 12/28/22 2300 12/28/22 1600   Does patient have an order for bedrest or is patient medically unstable No - Continue assessment No - Continue assessment No - Continue assessment No - Continue assessment --   What is the highest level of mobility based on the progressive mobility assessment? Level 3 (Stands with assist) - Balance while standing  and cannot march in place Level 4 (Walks with assist in room) - Balance while marching in place and cannot step forward and back - Complete Level 2 (Chairfast) - Balance while sitting on edge of bed and cannot stand Level 4 (Walks with assist in room) - Balance while marching in place and cannot step forward and back - Complete Level 4 (Walks with assist in room) - Balance while marching in place and cannot step forward and back - Complete   Is the above level different from baseline mobility prior to current illness? Yes - Recommend PT order -- Yes - Recommend PT order -- --    Row Name 12/28/22 1400 12/28/22 0744 12/27/22 2000 12/27/22 1811     Does patient have an order for bedrest or is patient medically unstable No - Continue assessment No - Continue assessment No - Continue assessment No - Continue assessment    What is the  highest level of mobility based on the progressive mobility assessment? Level 4 (Walks with assist in room) - Balance while marching in place and cannot step forward and back - Complete Level 4 (Walks with assist in room) - Balance while marching in place and cannot step forward and back - Complete Level 4 (Walks with assist in room) - Balance while marching in place and cannot step forward and back - Complete Level 4 (Walks with assist in room) - Balance  while marching in place and cannot step forward and back - Complete             Barriers to discharge: none Disposition Plan:  Home with HH? Status is: Inpt  Objective: Blood pressure (!) 162/68, pulse 68, temperature 98 F (36.7 C), temperature source Oral, resp. rate 18, height 5\' 3"  (1.6 m), weight 60.1 kg, SpO2 98%.  Examination:  Physical Exam Constitutional:      Appearance: Normal appearance.  HENT:     Head: Normocephalic and atraumatic.     Mouth/Throat:     Mouth: Mucous membranes are moist.  Eyes:     Extraocular Movements: Extraocular movements intact.  Cardiovascular:     Rate and Rhythm: Normal rate and regular rhythm.  Pulmonary:     Effort: Pulmonary effort is normal. No respiratory distress.     Breath sounds: Normal breath sounds. No wheezing.  Abdominal:     General: Bowel sounds are normal. There is no distension.     Palpations: Abdomen is soft.     Tenderness: There is no abdominal tenderness.  Musculoskeletal:     Cervical back: Normal range of motion and neck supple.     Comments: Some chronic erythema and skin changes noted in RLE but worse in LLE with blood blister appreciated and surrounding erythema and mild calor (area also TTP).  Overall has had significant improvement  Skin:    General: Skin is warm and dry.  Neurological:     General: No focal deficit present.     Mental Status: She is alert.  Psychiatric:        Mood and Affect: Mood normal.      Consultants:  Nephrology   Procedures:    Data Reviewed: Results for orders placed or performed during the hospital encounter of 12/23/22 (from the past 24 hour(s))  Glucose, capillary     Status: Abnormal   Collection Time: 12/29/22  4:30 PM  Result Value Ref Range   Glucose-Capillary 145 (H) 70 - 99 mg/dL  Glucose, capillary     Status: Abnormal   Collection Time: 12/29/22  8:04 PM  Result Value Ref Range   Glucose-Capillary 178 (H) 70 - 99 mg/dL  Magnesium     Status: Abnormal    Collection Time: 12/30/22  4:37 AM  Result Value Ref Range   Magnesium 2.5 (H) 1.7 - 2.4 mg/dL  Renal function panel     Status: Abnormal   Collection Time: 12/30/22  4:37 AM  Result Value Ref Range   Sodium 130 (L) 135 - 145 mmol/L   Potassium 4.1 3.5 - 5.1 mmol/L   Chloride 93 (L) 98 - 111 mmol/L   CO2 26 22 - 32 mmol/L   Glucose, Bld 112 (H) 70 - 99 mg/dL   BUN 30 (H) 8 - 23 mg/dL   Creatinine, Ser 4.16 (H) 0.44 - 1.00 mg/dL   Calcium 9.3 8.9 - 60.6 mg/dL   Phosphorus 5.9 (H) 2.5 - 4.6 mg/dL   Albumin 2.1 (  L) 3.5 - 5.0 g/dL   GFR, Estimated 10 (L) >60 mL/min   Anion gap 11 5 - 15  CBC with Differential/Platelet     Status: Abnormal   Collection Time: 12/30/22  4:37 AM  Result Value Ref Range   WBC 8.7 4.0 - 10.5 K/uL   RBC 2.34 (L) 3.87 - 5.11 MIL/uL   Hemoglobin 7.4 (L) 12.0 - 15.0 g/dL   HCT 40.9 (L) 81.1 - 91.4 %   MCV 104.3 (H) 80.0 - 100.0 fL   MCH 31.6 26.0 - 34.0 pg   MCHC 30.3 30.0 - 36.0 g/dL   RDW 78.2 (H) 95.6 - 21.3 %   Platelets 249 150 - 400 K/uL   nRBC 1.5 (H) 0.0 - 0.2 %   Neutrophils Relative % 80 %   Neutro Abs 7.0 1.7 - 7.7 K/uL   Lymphocytes Relative 10 %   Lymphs Abs 0.9 0.7 - 4.0 K/uL   Monocytes Relative 7 %   Monocytes Absolute 0.6 0.1 - 1.0 K/uL   Eosinophils Relative 2 %   Eosinophils Absolute 0.2 0.0 - 0.5 K/uL   Basophils Relative 0 %   Basophils Absolute 0.0 0.0 - 0.1 K/uL   Immature Granulocytes 1 %   Abs Immature Granulocytes 0.07 0.00 - 0.07 K/uL  Glucose, capillary     Status: None   Collection Time: 12/30/22  7:10 AM  Result Value Ref Range   Glucose-Capillary 86 70 - 99 mg/dL  Glucose, capillary     Status: None   Collection Time: 12/30/22  7:35 AM  Result Value Ref Range   Glucose-Capillary 93 70 - 99 mg/dL  Glucose, capillary     Status: Abnormal   Collection Time: 12/30/22 11:14 AM  Result Value Ref Range   Glucose-Capillary 112 (H) 70 - 99 mg/dL    I have reviewed pertinent nursing notes, vitals, labs, and images as  necessary. I have ordered labwork to follow up on as indicated.  I have reviewed the last notes from staff over past 24 hours. I have discussed patient's care plan and test results with nursing staff, CM/SW, and other staff as appropriate.  Time spent: Greater than 50% of the 55 minute visit was spent in counseling/coordination of care for the patient as laid out in the A&P.   LOS: 6 days   Lewie Chamber, MD [facility] 12/30/2022, 12:21 PM

## 2022-12-30 NOTE — Progress Notes (Signed)
Pt. Returned from dialysis around 6 pm, blood glucose 65, pt. Asymptomatic, apple juice and crackers offered and dinner tray ordered, pt. Refusing to drink juice and eat crackers, blood glucose dropped to 51, pt. Has no IV access at present, glucose tablet given, blood glucose increasing to 98, MD aware  Margarita Grizzle

## 2022-12-30 NOTE — Consult Note (Signed)
[facility] Liaison Consult Note   12/30/2022  ZOEH INZER 1935-07-10 [id number]  Covering Charlesetta Shanks, RN [facility] Liaison)   [facility]) Accountable Care Organization [ACO] Patient: Insurance [facility])   Primary Care Provider: Dr. Maryelizabeth Rowan (provides TOC follow up appointments) post hospital discharge.  Reviewed chart for VBCI care management services. Chart reveals admitting diagnosis of Cellulitis left lower extremity. Liaison spoke with son [name] who is the HCPOA. Discussed VBCI services with care management for post hospital prevention readmission services (receptive).   Plan. HIPAA verified and VBCI Population Health RN Liaison will continue to follow ongoing disposition in assessing for post hospital community care coordination/management needs.  Referral request for community care coordination: pending disposition  VBCI  Care Management/Population Health does not replace or interfere with any arrangements made by the Inpatient Transition of Care team.   For questions contact:     Elliot Cousin, RN, [facility] Liaison [place]   [facility], Population Health Office Hours MTWF  8:00 am-6:00 pm Direct Dial: [phone] mobile [phone] [Office toll free line] Office Hours are M-F 8:30 - 5 pm [email or web address]

## 2022-12-30 NOTE — Plan of Care (Signed)

## 2022-12-30 NOTE — Procedures (Signed)
Interventional Radiology Procedure Note  Procedure: Left nephrostomy tube exchange  Findings: Please refer to procedural dictation for full description. 10 Fr left nephrostomy - partially retracted.  Exchanged successfully.  Complications: None immediate  Estimated Blood Loss: < 5 ml  Recommendations: IR will arrange for routine 6 week nephrostomy exchange as an outpatient.   Marliss Coots, MD

## 2022-12-30 NOTE — Progress Notes (Addendum)
Received patient in bed to unit.  Alert and oriented.  Informed consent signed and in chart.   TX duration:3.5   Patient tolerated well.  Transported back to the room  Alert, without acute distress.  Hand-off given to patient's nurse.   Access used: left AVF Access issues: none  Total UF removed: 2L Medication(s) given: midodrine, albumin, hectorol    12/30/22 1625  Vitals  Temp (!) 97.4 F (36.3 C)  Temp Source Axillary  BP 138/75  MAP (mmHg) 88  BP Location Right Arm  BP Method Automatic  Patient Position (if appropriate) Lying  Pulse Rate Source Monitor  ECG Heart Rate 64  Resp 18  Oxygen Therapy  SpO2 97 %  O2 Device Room Air  During Treatment Monitoring  HD Safety Checks Performed Yes  Intra-Hemodialysis Comments Tx completed;Tolerated well  Dialysis Fluid Bolus Normal Saline  Bolus Amount (mL) 300 mL      Nethan Caudillo S [facility]

## 2022-12-30 NOTE — Progress Notes (Signed)
[facility] Progress Note   Subjective:   Nephrostomy tube exchange today. Patient is alert but disoriented initially on exam, thought today was Wednesday and that she had dialysis this AM, but reports she has just woken up so she was confused for a minute. She is A&O x3 after reorienting. Denies SOB, CP, dizziness, nausea.   Objective Vitals:   12/29/22 2004 12/30/22 0508 12/30/22 0741 12/30/22 0805  BP: (!) 149/77 (!) 161/65 (!) 158/71 (!) 162/68  Pulse: 66 68 67 68  Resp:   16 18  Temp: 97.9 F (36.6 C) 97.8 F (36.6 C) 97.6 F (36.4 C) 98 F (36.7 C)  TempSrc: Oral Oral Oral Oral  SpO2: 94% 98% 96% 98%  Weight:      Height:       Physical Exam General: Alert female in NAD Heart: RRR, no murmurs, rubs or gallops Lungs: CTA bilaterally Abdomen: Soft, non-distended, +BS Extremities: 1+ edema b/l lower extremities, + depended edema LUE Dialysis Access:  LUE AVF + t/b  Additional Objective Labs: Basic Metabolic Panel: Recent Labs  Lab 12/28/22 0218 12/29/22 0500 12/30/22 0437  NA 134* 132* 130*  K 4.1 4.9 4.1  CL 95* 95* 93*  CO2 28 20* 26  GLUCOSE 125* 120* 112*  BUN 19 28* 30*  CREATININE 2.53* 3.32* 4.02*  CALCIUM 9.2 9.3 9.3  PHOS 4.9* 5.6* 5.9*   Liver Function Tests: Recent Labs  Lab 12/24/22 0445 12/25/22 0538 12/26/22 0537 12/27/22 0542 12/28/22 0218 12/29/22 0500 12/30/22 0437  AST 52* 22 16  --   --   --   --   ALT 90* 63* 44  --   --   --   --   ALKPHOS 112 103 82  --   --   --   --   BILITOT 0.6 0.6 0.6  --   --   --   --   PROT 4.2* 4.1* 4.2*  --   --   --   --   ALBUMIN 1.7* 1.6* 1.9*   < > 2.1* 1.8* 2.1*   < > = values in this interval not displayed.   No results for input(s): "LIPASE", "AMYLASE" in the last 168 hours. CBC: Recent Labs  Lab 12/26/22 0537 12/27/22 0542 12/27/22 1439 12/28/22 0218 12/30/22 0437  WBC 10.3 10.2 12.1* 8.2 8.7  NEUTROABS  --  8.1*  --  5.9 7.0  HGB 7.1* 7.6* 7.8* 7.5* 7.4*  HCT  22.8* 24.2* 25.5* 24.5* 24.4*  MCV 100.4* 100.4* 101.6* 104.3* 104.3*  PLT 155 156 174 182 249   Blood Culture    Component Value Date/Time   SDES BLOOD LEFT ARM 12/23/2022 2331   SDES BLOOD RIGHT ARM 12/23/2022 2331   SPECREQUEST  12/23/2022 2331    BOTTLES DRAWN AEROBIC AND ANAEROBIC Blood Culture adequate volume   SPECREQUEST  12/23/2022 2331    BOTTLES DRAWN AEROBIC AND ANAEROBIC Blood Culture adequate volume   CULT  12/23/2022 2331    NO GROWTH 5 DAYS Performed at [facility], [address], [place], [place] [address]    CULT  12/23/2022 2331    NO GROWTH 5 DAYS Performed at [facility] Lab, [address] [place], [place] [address]    REPTSTATUS 12/29/2022 FINAL 12/23/2022 2331   REPTSTATUS 12/29/2022 FINAL 12/23/2022 2331    Cardiac Enzymes: No results for input(s): "CKTOTAL", "CKMB", "CKMBINDEX", "TROPONINI" in the last 168 hours. CBG: Recent Labs  Lab 12/29/22 1119 12/29/22  1630 12/29/22 2004 12/30/22 0710 12/30/22 0735  GLUCAP 98 145* 178* 86 93   Iron Studies: No results for input(s): "IRON", "TIBC", "TRANSFERRIN", "FERRITIN" in the last 72 hours. @lablastinr3 @ Studies/Results: No results found. Medications:  cefTRIAXone (ROCEPHIN)  IV 2 g (12/29/22 1527)    amantadine  100 mg Oral Weekly   amiodarone  200 mg Oral Daily   aspirin  81 mg Oral Daily   Chlorhexidine Gluconate Cloth  6 each Topical Q0600   darbepoetin (ARANESP) injection - DIALYSIS  100 mcg Subcutaneous Q Wed-1800   doxercalciferol  6 mcg Intravenous Q M,W,F-HD   feeding supplement  237 mL Oral BID BM   insulin aspart  0-5 Units Subcutaneous QHS   insulin aspart  0-6 Units Subcutaneous TID WC   lipase/protease/amylase  72,000 Units Oral TID WC   [START ON 12/31/2022] metoprolol succinate  12.5 mg Oral Q T,Th,S,Su   midodrine  10 mg Oral Q M,W,F   rOPINIRole  0.25 mg Oral q morning   And   rOPINIRole  0.5 mg Oral QHS   sevelamer carbonate  800 mg Oral TID WC   sodium  chloride flush  3 mL Intravenous Q12H    Dialysis Orders: [facility]: [facility]  on MWF . 160NRe 3 hr 30 min BFR 350 DFR Auto 1.5 EDW 55kg 2K 2Ca AVF 16g  No heparin Mircera IV q 2 weeks- has not received ESA recently  Hectorol IV q HD  Assessment/Plan: 1. Lower leg cellulitis: improving with antibiotics.  2. ESRD:  On MWF schedule. HD tomorrow per regular schedule.  3. A fib - noted after HD on Friday. Possibly provoked by acute on chronic hypotension +ultrafiltration w/HD. Now back in NSR. On amiodarone and metoprolol.  4. Nephrostomy tube - exchanged today, reports procedure was painful but back pain is improving 5. Hypertension/volume: BP has been dropping on HD despite midodrine. Takes 10mg  pre-HD and occasionally an extra dose mid HD. Will continue midodrine and add prn order for 1/2 through treatment. As well as PRN albumin with HD for BP support.  LE edema on exam.  UF limited by hypotension.  Continue UF as tolerated.  6. Anemia: last Hgb 7.4, unable to give back blood with HD on Friday due to +chlorine test. Continue aranesp qWed. 7. Metabolic bone disease: Corrected calcium and phos mildly elevated, reducing VDRA.  Continue binders 8. Nutrition:  Alb 2.1 , will need renal diet and protein supplements 9. Recent TBI/CIR admission: improved but reports she is still weak. Gets PT at home. Holding heparin with HD.  Rogers Blocker, PA-C 12/30/2022, 9:36 AM  [facility] Pager: [phone]

## 2022-12-30 NOTE — Progress Notes (Signed)
PT Cancellation Note  Patient Details Name: Kim Burns MRN: [id number] DOB: July 26, 1935   Cancelled Treatment:    Reason Eval/Treat Not Completed: Patient at procedure or test/unavailable  Patient off the floor for dialysis. Noted plans for return home 12/31/22 with son (which was also recommended on PT evaluation). MD and TOC aware.    Jerolyn Center, PT Acute Rehabilitation Services  Office [phone]  Kim Burns 12/30/2022, 2:32 PM

## 2022-12-31 DIAGNOSIS — I959 Hypotension, unspecified: Secondary | ICD-10-CM | POA: Diagnosis not present

## 2022-12-31 DIAGNOSIS — L03116 Cellulitis of left lower limb: Secondary | ICD-10-CM | POA: Diagnosis not present

## 2022-12-31 DIAGNOSIS — R5381 Other malaise: Secondary | ICD-10-CM | POA: Diagnosis not present

## 2022-12-31 DIAGNOSIS — N186 End stage renal disease: Secondary | ICD-10-CM | POA: Diagnosis not present

## 2022-12-31 LAB — RENAL FUNCTION PANEL
Albumin: 2.4 g/dL — ABNORMAL LOW (ref 3.5–5.0)
Anion gap: 10 (ref 5–15)
BUN: 15 mg/dL (ref 8–23)
CO2: 26 mmol/L (ref 22–32)
Calcium: 9 mg/dL (ref 8.9–10.3)
Chloride: 96 mmol/L — ABNORMAL LOW (ref 98–111)
Creatinine, Ser: 2.59 mg/dL — ABNORMAL HIGH (ref 0.44–1.00)
GFR, Estimated: 18 mL/min — ABNORMAL LOW (ref >60)
Glucose, Bld: 86 mg/dL (ref 70–99)
Phosphorus: 4.1 mg/dL (ref 2.5–4.6)
Potassium: 4.1 mmol/L (ref 3.5–5.1)
Sodium: 132 mmol/L — ABNORMAL LOW (ref 135–145)

## 2022-12-31 LAB — CBC WITH DIFFERENTIAL/PLATELET
Abs Immature Granulocytes: 0.08 10*3/uL — ABNORMAL HIGH (ref 0.00–0.07)
Basophils Absolute: 0 10*3/uL (ref 0.0–0.1)
Basophils Relative: 0 %
Eosinophils Absolute: 0.2 10*3/uL (ref 0.0–0.5)
Eosinophils Relative: 2 %
HCT: 22.2 % — ABNORMAL LOW (ref 36.0–46.0)
Hemoglobin: 7 g/dL — ABNORMAL LOW (ref 12.0–15.0)
Immature Granulocytes: 1 %
Lymphocytes Relative: 13 %
Lymphs Abs: 1.3 10*3/uL (ref 0.7–4.0)
MCH: 33 pg (ref 26.0–34.0)
MCHC: 31.5 g/dL (ref 30.0–36.0)
MCV: 104.7 fL — ABNORMAL HIGH (ref 80.0–100.0)
Monocytes Absolute: 0.7 10*3/uL (ref 0.1–1.0)
Monocytes Relative: 7 %
Neutro Abs: 7.5 10*3/uL (ref 1.7–7.7)
Neutrophils Relative %: 77 %
Platelets: 239 10*3/uL (ref 150–400)
RBC: 2.12 MIL/uL — ABNORMAL LOW (ref 3.87–5.11)
RDW: 16.5 % — ABNORMAL HIGH (ref 11.5–15.5)
WBC: 9.8 10*3/uL (ref 4.0–10.5)
nRBC: 1.7 % — ABNORMAL HIGH (ref 0.0–0.2)

## 2022-12-31 LAB — GLUCOSE, CAPILLARY
Glucose-Capillary: 75 mg/dL (ref 70–99)
Glucose-Capillary: 124 mg/dL — ABNORMAL HIGH (ref 70–99)
Glucose-Capillary: 75 mg/dL (ref 70–99)
Glucose-Capillary: 101 mg/dL — ABNORMAL HIGH (ref 70–99)

## 2022-12-31 LAB — PREPARE RBC (CROSSMATCH)

## 2022-12-31 MED ORDER — CHLORHEXIDINE GLUCONATE CLOTH 2 % EX PADS: 6.0000 | MEDICATED_PAD | Freq: Every day | CUTANEOUS | Status: DC

## 2022-12-31 MED ORDER — SODIUM CHLORIDE 0.9% IV SOLUTION: Freq: Once | INTRAVENOUS | Status: DC

## 2022-12-31 MED ORDER — DARBEPOETIN ALFA 150 MCG/0.3ML IJ SOSY: 150.0000 ug | PREFILLED_SYRINGE | INTRAMUSCULAR | Status: DC

## 2022-12-31 NOTE — Progress Notes (Signed)
Occupational Therapy Treatment Patient Details Name: Kim Burns MRN: [id number] DOB: May 03, 1935 Today's Date: 12/31/2022   History of present illness 87 y.o. F who presented 11/4 with worsening hypotension with dialysis and left leg cellulitis; notable recent admissions, 08/2022 aFib with RVR, early Sept 2024 (found down) with R frontal contusion, skull fxs, developed subdural hematomas, L pubic ramus fx (WBAT), small non-displaced S4 fx, ischemic CVAs; Underwent rehab at [facility] and dc'd to home on 9/28; Afib with RVR 11/8 PMHx:  ESRD on HD MWF, DM, RCC s/p ablation, IPMN s/p [place], pAF not on AC, dCHF, hx CVA and OSA not on CPAP.   OT comments  Pt not feeling well today, lethargic, pain in BUEs, restless, anxious about going home, and not able to open eyes during session limiting participation. Pt had planned to return home with son today, but RN states plan is to return tomorrow after HD and blood transfusion. Pt has demonstrated a decline in function since initial eval. Hoping to see improvement tomorrow after HD, will reassess needs tomorrow. Pt has 24/7 support at home from son per Pt.       If plan is discharge home, recommend the following:  A little help with walking and/or transfers;A little help with bathing/dressing/bathroom;Assistance with cooking/housework;Assist for transportation;Help with stairs or ramp for entrance   Equipment Recommendations  None recommended by OT    Recommendations for Other Services      Precautions / Restrictions Precautions Precautions: Fall Restrictions Weight Bearing Restrictions: No       Mobility Bed Mobility Overal bed mobility: Needs Assistance Bed Mobility: Rolling Rolling: Max assist         General bed mobility comments: max A for scooting in bed, not able to get OOB today, leghargic, painful    Transfers Overall transfer level: Needs assistance                 General transfer comment: too lethargic, Hgb 7.0      Balance                                           ADL either performed or assessed with clinical judgement   ADL Overall ADL's : Needs assistance/impaired Eating/Feeding: Set up;Bed level   Grooming: Minimal assistance;Bed level                                 General ADL Comments: Pt has had decline in function, Hgb 7.0, planning to get transfusion tomorrow, will reassess after    Extremity/Trunk Assessment Upper Extremity Assessment Upper Extremity Assessment: Generalized weakness            Vision       Perception     Praxis      Cognition Arousal: Lethargic Behavior During Therapy: Restless, Anxious Overall Cognitive Status: Impaired/Different from baseline Area of Impairment: Attention, Safety/judgement, Awareness, Problem solving                   Current Attention Level: Focused     Safety/Judgement: Decreased awareness of deficits, Decreased awareness of safety   Problem Solving: Slow processing, Decreased initiation General Comments: Pt restless, anxious, not opening eyes. Pt able to answer questions for the most part, but difficulty following commands for ADL participation due to pain and discomfort.  Exercises      Shoulder Instructions       General Comments Pt very lethargic, minimal participation today, assisted with positioning in bed as Pt was sideways in bed upon arrival. Pt able to converse about plan but very restless/anxious.    Pertinent Vitals/ Pain       Pain Assessment Pain Assessment: Faces Faces Pain Scale: Hurts even more Pain Location: LLE, BUEs Pain Descriptors / Indicators: Discomfort, Grimacing, Aching Pain Intervention(s): Limited activity within patient's tolerance  Home Living                                          Prior Functioning/Environment              Frequency  Min 1X/week        Progress Toward Goals  OT Goals(current goals can  now be found in the care plan section)  Progress towards OT goals: OT to reassess next treatment  Acute Rehab OT Goals Patient Stated Goal: to return home OT Goal Formulation: With patient Time For Goal Achievement: 01/08/23 Potential to Achieve Goals: Good ADL Goals Pt Will Transfer to Toilet: with modified independence;ambulating Pt/caregiver will Perform Home Exercise Program: Increased strength;Both right and left upper extremity;With written HEP provided;With theraband;Independently Additional ADL Goal #1: Pt will be able to tolerate 5 minutes of continuous OOB activities to improve activity tolerance.  Plan      Co-evaluation                 AM-PAC OT "6 Clicks" Daily Activity     Outcome Measure   Help from another person eating meals?: A Little Help from another person taking care of personal grooming?: A Lot Help from another person toileting, which includes using toliet, bedpan, or urinal?: A Lot Help from another person bathing (including washing, rinsing, drying)?: A Lot Help from another person to put on and taking off regular upper body clothing?: A Lot Help from another person to put on and taking off regular lower body clothing?: A Lot 6 Click Score: 13    End of Session    OT Visit Diagnosis: Unsteadiness on feet (R26.81);Other abnormalities of gait and mobility (R26.89);Muscle weakness (generalized) (M62.81);History of falling (Z91.81);Pain Pain - Right/Left: Left Pain - part of body: Ankle and joints of foot   Activity Tolerance Patient limited by lethargy;Patient limited by fatigue;Patient limited by pain   Patient Left in bed;with call bell/phone within reach;with bed alarm set   Nurse Communication Mobility status        Time: 3875-6433 OT Time Calculation (min): 13 min  Charges: OT General Charges $OT Visit: 1 Visit OT Treatments $Therapeutic Activity: 8-22 mins  [address] OTR/L   Kim Burns 12/31/2022, 2:45 PM

## 2022-12-31 NOTE — Progress Notes (Signed)
[facility] Progress Note   Subjective:   Per notes tolerated HD yesterday with 2L UF, BP actually on the high side today. She appears very tired today, opens eyes to voice but falls back asleep quickly. She is A&O x3 at time of my exam but appears quite weak.   Objective Vitals:   12/30/22 1746 12/30/22 2037 12/31/22 0628 12/31/22 0754  BP: (!) 145/56 (!) 142/53 (!) 145/67 (!) 169/73  Pulse: (!) 59 67 70 66  Resp: 17 20  16   Temp: (!) 97.5 F (36.4 C) 98 F (36.7 C) 98 F (36.7 C) 97.6 F (36.4 C)  TempSrc: Oral Oral    SpO2: (!) 85% 100% 97% 92%  Weight:      Height:       Physical Exam General: Tired appearing female in NAD Heart: RRR, no murmurs, rubs or gallops Lungs: CTA bilaterally Abdomen: Soft, non-distended, +BS Extremities: trace edema b/l lower extremities, + trace dependent edema LUE Dialysis Access:  LUE AVF + t/b  Additional Objective Labs: Basic Metabolic Panel: Recent Labs  Lab 12/29/22 0500 12/30/22 0437 12/31/22 0425  NA 132* 130* 132*  K 4.9 4.1 4.1  CL 95* 93* 96*  CO2 20* 26 26  GLUCOSE 120* 112* 86  BUN 28* 30* 15  CREATININE 3.32* 4.02* 2.59*  CALCIUM 9.3 9.3 9.0  PHOS 5.6* 5.9* 4.1   Liver Function Tests: Recent Labs  Lab 12/25/22 0538 12/26/22 0537 12/27/22 0542 12/29/22 0500 12/30/22 0437 12/31/22 0425  AST 22 16  --   --   --   --   ALT 63* 44  --   --   --   --   ALKPHOS 103 82  --   --   --   --   BILITOT 0.6 0.6  --   --   --   --   PROT 4.1* 4.2*  --   --   --   --   ALBUMIN 1.6* 1.9*   < > 1.8* 2.1* 2.4*   < > = values in this interval not displayed.   No results for input(s): "LIPASE", "AMYLASE" in the last 168 hours. CBC: Recent Labs  Lab 12/27/22 1439 12/28/22 0218 12/30/22 0437 12/30/22 1107 12/31/22 0425  WBC 12.1* 8.2 8.7 8.5 9.8  NEUTROABS  --  5.9 7.0  --  7.5  HGB 7.8* 7.5* 7.4* 7.3* 7.0*  HCT 25.5* 24.5* 24.4* 23.0* 22.2*  MCV 101.6* 104.3* 104.3* 105.0* 104.7*  PLT 174 182 249  247 239   Blood Culture    Component Value Date/Time   SDES BLOOD LEFT ARM 12/23/2022 2331   SDES BLOOD RIGHT ARM 12/23/2022 2331   SPECREQUEST  12/23/2022 2331    BOTTLES DRAWN AEROBIC AND ANAEROBIC Blood Culture adequate volume   SPECREQUEST  12/23/2022 2331    BOTTLES DRAWN AEROBIC AND ANAEROBIC Blood Culture adequate volume   CULT  12/23/2022 2331    NO GROWTH 5 DAYS Performed at [facility] Lab, [address] [place], [place] [address]    CULT  12/23/2022 2331    NO GROWTH 5 DAYS Performed at [facility], [address], [place], [place] [address]    REPTSTATUS 12/29/2022 FINAL 12/23/2022 2331   REPTSTATUS 12/29/2022 FINAL 12/23/2022 2331    Cardiac Enzymes: No results for input(s): "CKTOTAL", "CKMB", "CKMBINDEX", "TROPONINI" in the last 168 hours. CBG: Recent Labs  Lab 12/30/22 1816 12/30/22 1845 12/30/22 1858 12/30/22 2040 12/31/22 0755  GLUCAP  57* 51* 98 103* 75   Iron Studies: No results for input(s): "IRON", "TIBC", "TRANSFERRIN", "FERRITIN" in the last 72 hours. @lablastinr3 @ Studies/Results: IR NEPHROSTOMY EXCHANGE LEFT  Result Date: 12/30/2022 INDICATION: 87 year old female with chronic indwelling left percutaneous nephrostomy tube, currently admitted with pain at catheter entry site and possible leakage during flushing. EXAM: FLUOROSCOPIC GUIDED left SIDED NEPHROSTOMY CATHETER EXCHANGE COMPARISON:  None Available. CONTRAST:  A total of 10 mL Isovue-300 administered was administered into the collecting system FLUOROSCOPY TIME:  Six mGy COMPLICATIONS: None immediate. TECHNIQUE: Informed written consent was obtained from the patient after a discussion of the risks, benefits and alternatives to treatment. Questions regarding the procedure were encouraged and answered. A timeout was performed prior to the initiation of the procedure. The left flank and external portions of existing nephrostomy catheter was prepped and draped in the usual sterile fashion.  A sterile drape was applied covering the operative field. Maximum barrier sterile technique with sterile gowns and gloves were used for the procedure. A timeout was performed prior to the initiation of the procedure. A pre procedural spot fluoroscopic image was obtained. A small amount of contrast was injected via the existing nephrostomy catheter demonstrating partial retraction into a minor calyx. The existing nephrostomy catheter was cut and cannulated with an Amplatz wire which was coiled within the renal pelvis. Under intermittent fluoroscopic guidance, the existing nephrostomy catheter was exchanged for a new 10 [place] all-purpose drainage catheter. Limited contrast injection confirmed appropriate positioning within the renal pelvis and a post exchange fluoroscopic image was obtained. The catheter was locked, secured to the skin with an interrupted suture and reconnected to a gravity bag. A dressing was placed. The patient tolerated the procedure well without immediate postprocedural complication. FINDINGS: The existing nephrostomy catheter is partially retracted but within the collecting system. After successful fluoroscopic guided exchange, a new 10French nephrostomy catheter is coiled and locked within the renal pelvis. IMPRESSION: Successful fluoroscopic guided exchange of left 10 French percutaneous nephrostomy catheter. Marliss Coots, MD [facility] Electronically Signed   By: Marliss Coots M.D.   On: 12/30/2022 11:03   Medications:   sodium chloride   Intravenous Once   amantadine  100 mg Oral Weekly   amiodarone  200 mg Oral Daily   aspirin  81 mg Oral Daily   Chlorhexidine Gluconate Cloth  6 each Topical Q0600   darbepoetin (ARANESP) injection - DIALYSIS  100 mcg Subcutaneous Q Wed-1800   doxercalciferol  4 mcg Intravenous Q M,W,F-HD   feeding supplement  237 mL Oral BID BM   insulin aspart  0-5 Units Subcutaneous QHS   insulin  aspart  0-6 Units Subcutaneous TID WC   lipase/protease/amylase  72,000 Units Oral TID WC   metoprolol succinate  12.5 mg Oral Q T,Th,S,Su   midodrine  10 mg Oral Q M,W,F   rOPINIRole  0.25 mg Oral q morning   And   rOPINIRole  0.5 mg Oral QHS   sevelamer carbonate  800 mg Oral TID WC   sodium chloride flush  3 mL Intravenous Q12H    Dialysis Orders: [facility]: [facility]  on MWF . 160NRe 3 hr 30 min BFR 350 DFR Auto 1.5 EDW 55kg 2K 2Ca AVF 16g  No heparin Mircera IV q 2 weeks- has not received ESA recently  Hectorol IV q HD  Assessment/Plan: 1. Lower leg cellulitis: improving with antibiotics.  2. ESRD:  On MWF schedule. HD tomorrow per regular schedule.  3. A  fib - noted after HD on Friday. Possibly provoked by acute on chronic hypotension +ultrafiltration w/HD. Now back in NSR. On amiodarone and metoprolol.  4. Nephrostomy tube - exchanged 12/30/22, reports procedure was painful but back pain is improving 5. Hypertension/volume: BP has been dropping on HD despite midodrine. Takes 10mg  pre-HD and occasionally an extra dose mid HD. Will continue midodrine and add prn order for 1/2 through treatment. As well as PRN albumin with HD for BP support.  LE edema on exam.  UF limited by hypotension but BP seems to be improving  6. Anemia: last Hgb 7, unable to give back blood with HD on Friday due to +chlorine test. Increasing aranesp dose. Transfuse PRN 7. Metabolic bone disease: Corrected calcium and phos mildly elevated, reducing VDRA.  Continue binders 8. Nutrition:  Alb 2.4-improved , will need renal diet and protein supplements 9. Recent TBI/CIR admission: improved but reports she is still weak. Seems weaker over the past two days, will reach out to primary MD. ? If anemia is contributing. Gets PT at home. Holding heparin with HD.  Rogers Blocker, PA-C 12/31/2022, 9:55 AM  [facility] Pager: [phone]

## 2022-12-31 NOTE — Assessment & Plan Note (Addendum)
-   borderline even failure to thrive at this point  - prolonged hospitalization and minimal participation with PT/OT unfortunately - palliative care has been following and multiple GOC discussions have been held; unfortunately she continues to struggle with much meaningful improvement. I also spoke with Kim Burns at length on 11/11; they are still comfortable bringing her home if she can at least be a little more comfortable and more awake/alert.  She continues to have significant episodes of delirium and today she was almost screaming out in pain and not redirectable because of discomfort at her IV site that was just placed overnight and with the PRBC transfusion today - she just looks worse to me overall; in my medical opinion a hospice candidate but she's been adamant on continuing treatments and HD etc.; I did inform family that she will eventually approach a point (u/k when yet) where her functional status will be so poor that she may no longer be offered dialysis anymore and at this rate she may be approaching it sooner than later - we will have to delay discharge yet again today as she just does not look in any condition to go home today; she also still needs blood with Hgb 7 g/dL today and presumed further in blood filter loss with HD tomorrow - repeat labs in morning and will need to be arranged to get blood while in HD on 11/13 and hopefully if she's comfortable, mostly oriented, she can go home with family; BUT high risk of readmission and everyone seems to understand this. Unfortunately I do not think she's going to change her intentions until she no longer has a choice in regards to future dialysis

## 2022-12-31 NOTE — Progress Notes (Addendum)
D/C order noted. Contacted [facility] to advise clinic of pt's d/c today and that pt should resume care tomorrow.   Olivia Canter Renal Navigator [phone]  Addendum at 2:30 pm: Clinic advised that pt's d/c has been cancelled.

## 2022-12-31 NOTE — Progress Notes (Signed)
Progress Note    Kim Burns   GEX:[id number]  DOB: Oct 07, 1935  DOA: 12/23/2022     7 PCP: Lewis Moccasin, MD  Initial CC: hypotension with HD and LLE cellulitis   Hospital Course: Kim Burns is an 87 y.o. Female with ESRD on HD MWF, DM, RCC s/p ablation, IPMN s/p Whipple, PAF not on AC, dCHF, hx CVA and OSA not on CPAP who presented with worsening hypotension with dialysis and left leg cellulitis.  Interval History:  Very uncomfortable and almost screaming in pain from her IV in right arm while blood had just been stated. Difficult to redirect her and all the wanted was the pain to stop and IV out.  Still somewhat confused and talking to people not in room again.  She continues to wax/wane in terms of stability for going home. This is a difficult situation.   Assessment and Plan: * Cellulitis of left lower extremity-resolved as of 12/31/2022 - no purulent drainage; some bruising noted - s/p rocephin x 7 days; seems to be improved from admission   Physical deconditioning - borderline even failure to thrive at this point  - prolonged hospitalization and minimal participation with PT/OT unfortunately - palliative care has been following and multiple GOC discussions have been held; unfortunately she continues to struggle with much meaningful improvement. I also spoke with [name] at length on 11/11; they are still comfortable bringing her home if she can at least be a little more comfortable and more awake/alert.  She continues to have significant episodes of delirium and today she was almost screaming out in pain and not redirectable because of discomfort at her IV site that was just placed overnight and with the PRBC transfusion today - she just looks worse to me overall; in my medical opinion a hospice candidate but she's been adamant on continuing treatments and HD etc.; I did inform family that she will eventually approach a point (u/k when yet) where her functional status will  be so poor that she may no longer be offered dialysis anymore and at this rate she may be approaching it sooner than later - we will have to delay discharge yet again today as she just does not look in any condition to go home today; she also still needs blood with Hgb 7 g/dL today and presumed further in blood filter loss with HD tomorrow - repeat labs in morning and will need to be arranged to get blood while in HD on 11/13 and hopefully if she's comfortable, mostly oriented, she can go home with family; BUT high risk of readmission and everyone seems to understand this. Unfortunately I do not think she's going to change her intentions until she no longer has a choice in regards to future dialysis   Hypotension-resolved as of 12/31/2022 - On midodrine on dialysis days and pressures are improved  Nephrostomy complication (HCC) - Patient complaining of worsening discomfort around left nephrostomy tube site.  Also noted to have leaking from drainage site when tube being flushed -Patient states was recently replaced on 12/16/2022 - found to be partially retracted; exchanged by IR on 11/11; next exchange in 6 weeks outpatient  End-stage renal disease on hemodialysis (HCC) - on HD MWF - continue as per nephrology   Hypoalbuminemia - s/p albumin  Elevated LFTs - Hepatic steatosis noted on RUQ ultrasound; hx of CCY - Trend LFTs intermittently  Paroxysmal atrial fibrillation (HCC) - Not on anticoagulation at home -Given episode of A-fib with RVR, resuming Toprol  and will monitor blood pressure for tolerance (change to non-HD days) -Continue amiodarone  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) - continue SSI and CBG monitoring   Chronic diastolic CHF (congestive heart failure) (HCC) - no s/s exac  Parkinson disease (HCC) - Continue Requip - Resume amantadine  Exocrine pancreatic insufficiency - continue creon     Old records reviewed in assessment of this  patient  Antimicrobials: Cefepime 11/5 x 1 Rocephin 11/5 >> 11/12   DVT prophylaxis:  Place and maintain sequential compression device Start: 12/24/22 1624   Code Status:   Code Status: Full Code  Mobility Assessment (Last 72 Hours)     Mobility Assessment     Row Name 12/31/22 1434 12/31/22 1000 12/30/22 1000 12/30/22 0104 12/29/22 1000   Does patient have an order for bedrest or is patient medically unstable -- No - Continue assessment No - Continue assessment No - Continue assessment No - Continue assessment   What is the highest level of mobility based on the progressive mobility assessment? Level 1 (Bedfast) - Unable to balance while sitting on edge of bed Level 3 (Stands with assist) - Balance while standing  and cannot march in place Level 3 (Stands with assist) - Balance while standing  and cannot march in place Level 4 (Walks with assist in room) - Balance while marching in place and cannot step forward and back - Complete Level 2 (Chairfast) - Balance while sitting on edge of bed and cannot stand   Is the above level different from baseline mobility prior to current illness? -- Yes - Recommend PT order Yes - Recommend PT order -- Yes - Recommend PT order    Row Name 12/28/22 2300           Does patient have an order for bedrest or is patient medically unstable No - Continue assessment       What is the highest level of mobility based on the progressive mobility assessment? Level 4 (Walks with assist in room) - Balance while marching in place and cannot step forward and back - Complete                Barriers to discharge: none Disposition Plan:  Home with HH? Status is: Inpt  Objective: Blood pressure (!) 142/63, pulse (!) 58, temperature 97.6 F (36.4 C), temperature source Oral, resp. rate 18, height 5\' 3"  (1.6 m), weight 57.2 kg, SpO2 98%.  Examination:  Physical Exam Constitutional:      Appearance: Normal appearance.  HENT:     Head: Normocephalic and  atraumatic.     Mouth/Throat:     Mouth: Mucous membranes are moist.  Eyes:     Extraocular Movements: Extraocular movements intact.  Cardiovascular:     Rate and Rhythm: Normal rate and regular rhythm.  Pulmonary:     Effort: Pulmonary effort is normal. No respiratory distress.     Breath sounds: Normal breath sounds. No wheezing.  Abdominal:     General: Bowel sounds are normal. There is no distension.     Palpations: Abdomen is soft.     Tenderness: There is no abdominal tenderness.  Musculoskeletal:     Cervical back: Normal range of motion and neck supple.     Comments: Some chronic erythema and skin changes noted in RLE but worse in LLE with blood blister appreciated and surrounding erythema and mild calor (area also TTP).  Overall has had significant improvement  Skin:    General: Skin is warm  and dry.  Neurological:     General: No focal deficit present.     Mental Status: She is alert.  Psychiatric:        Mood and Affect: Mood normal.      Consultants:  Nephrology  Palliative care  Procedures:    Data Reviewed: Results for orders placed or performed during the hospital encounter of 12/23/22 (from the past 24 hour(s))  Glucose, capillary     Status: Abnormal   Collection Time: 12/30/22  5:38 PM  Result Value Ref Range   Glucose-Capillary 65 (L) 70 - 99 mg/dL  Glucose, capillary     Status: Abnormal   Collection Time: 12/30/22  6:16 PM  Result Value Ref Range   Glucose-Capillary 57 (L) 70 - 99 mg/dL  Glucose, capillary     Status: Abnormal   Collection Time: 12/30/22  6:45 PM  Result Value Ref Range   Glucose-Capillary 51 (L) 70 - 99 mg/dL  Glucose, capillary     Status: None   Collection Time: 12/30/22  6:58 PM  Result Value Ref Range   Glucose-Capillary 98 70 - 99 mg/dL  Glucose, capillary     Status: Abnormal   Collection Time: 12/30/22  8:40 PM  Result Value Ref Range   Glucose-Capillary 103 (H) 70 - 99 mg/dL  Renal function panel     Status:  Abnormal   Collection Time: 12/31/22  4:25 AM  Result Value Ref Range   Sodium 132 (L) 135 - 145 mmol/L   Potassium 4.1 3.5 - 5.1 mmol/L   Chloride 96 (L) 98 - 111 mmol/L   CO2 26 22 - 32 mmol/L   Glucose, Bld 86 70 - 99 mg/dL   BUN 15 8 - 23 mg/dL   Creatinine, Ser 1.61 (H) 0.44 - 1.00 mg/dL   Calcium 9.0 8.9 - 09.6 mg/dL   Phosphorus 4.1 2.5 - 4.6 mg/dL   Albumin 2.4 (L) 3.5 - 5.0 g/dL   GFR, Estimated 18 (L) >60 mL/min   Anion gap 10 5 - 15  CBC with Differential/Platelet     Status: Abnormal   Collection Time: 12/31/22  4:25 AM  Result Value Ref Range   WBC 9.8 4.0 - 10.5 K/uL   RBC 2.12 (L) 3.87 - 5.11 MIL/uL   Hemoglobin 7.0 (L) 12.0 - 15.0 g/dL   HCT 04.5 (L) 40.9 - 81.1 %   MCV 104.7 (H) 80.0 - 100.0 fL   MCH 33.0 26.0 - 34.0 pg   MCHC 31.5 30.0 - 36.0 g/dL   RDW 91.4 (H) 78.2 - 95.6 %   Platelets 239 150 - 400 K/uL   nRBC 1.7 (H) 0.0 - 0.2 %   Neutrophils Relative % 77 %   Neutro Abs 7.5 1.7 - 7.7 K/uL   Lymphocytes Relative 13 %   Lymphs Abs 1.3 0.7 - 4.0 K/uL   Monocytes Relative 7 %   Monocytes Absolute 0.7 0.1 - 1.0 K/uL   Eosinophils Relative 2 %   Eosinophils Absolute 0.2 0.0 - 0.5 K/uL   Basophils Relative 0 %   Basophils Absolute 0.0 0.0 - 0.1 K/uL   Immature Granulocytes 1 %   Abs Immature Granulocytes 0.08 (H) 0.00 - 0.07 K/uL  Glucose, capillary     Status: None   Collection Time: 12/31/22  7:55 AM  Result Value Ref Range   Glucose-Capillary 75 70 - 99 mg/dL  Type and screen [facility]     Status: None (Preliminary result)  Collection Time: 12/31/22  8:31 AM  Result Value Ref Range   ABO/RH(D) A POS    Antibody Screen NEG    Sample Expiration 01/03/2023,2359    Unit Number [id number]    Blood Component Type RED CELLS,LR    Unit division 00    Status of Unit ISSUED    Transfusion Status OK TO TRANSFUSE    Crossmatch Result Compatible    Unit tag comment      PATIENT RECEIVED APPROX PRIOR TO IV CONCERNS. PATIENT TO  RECEIVE A UNIT IN HD LATER TODAY. Performed at [facility], [address] [place], [place] [address]   Prepare RBC (crossmatch)     Status: None   Collection Time: 12/31/22  8:35 AM  Result Value Ref Range   Order Confirmation      ORDER PROCESSED BY BLOOD BANK Performed at [facility], [address], [place], [place] [address]   Glucose, capillary     Status: Abnormal   Collection Time: 12/31/22 12:38 PM  Result Value Ref Range   Glucose-Capillary 124 (H) 70 - 99 mg/dL  Glucose, capillary     Status: None   Collection Time: 12/31/22  3:36 PM  Result Value Ref Range   Glucose-Capillary 75 70 - 99 mg/dL    I have reviewed pertinent nursing notes, vitals, labs, and images as necessary. I have ordered labwork to follow up on as indicated.  I have reviewed the last notes from staff over past 24 hours. I have discussed patient's care plan and test results with nursing staff, CM/SW, and other staff as appropriate.  Time spent: Greater than 50% of the 55 minute visit was spent in counseling/coordination of care for the patient as laid out in the A&P.   LOS: 7 days   Lewie Chamber, MD [facility] 12/31/2022, 4:17 PM

## 2022-12-31 NOTE — Progress Notes (Signed)
Nutrition Follow-up  DOCUMENTATION CODES:   Non-severe (moderate) malnutrition in context of chronic illness  INTERVENTION:  Continue with current diet. Continue with Ensure Plus High Protein po BID, each supplement provides 350 kcal and 20 grams of protein.    NUTRITION DIAGNOSIS:   Moderate Malnutrition related to chronic illness as evidenced by moderate muscle depletion, moderate fat depletion. Continues to be valid addressed through interventions.     GOAL:   Patient will meet greater than or equal to 90% of their needs    MONITOR:   PO intake, Supplement acceptance, Weight trends, Labs, I & O's  REASON FOR ASSESSMENT:   Consult Assessment of nutrition requirement/status  ASSESSMENT:   87 y.o. F, Admitted from home with cellulitis of lower extremities. PMH: IPMN of pancreas status post Whipple procedure, insulin-dependent diabetes mellitus, RCC status post ablation, ESRD on hemodialysis, PAF not anticoagulated, HFmrEF, history of CVA, and OSA with CPAP intolerance.  Current average intake 25-75%. Patient states that appetite ok. Has not had much of a change. Is anxious about going home. Enjoys the supplements, Stated she has  no dietary concerns at this time. Continues with HD. Meds reviewed.  Hospital weight history: 12/30/22 1627 57.2 kg 126.1 lbs  12/30/22 1220 60.1 kg 132.5 lbs  12/27/22 1235 55 kg 121.25 lbs  12/27/22 0827 56.4 kg 124.34 lbs  12/25/22 1249 56 kg 123.46 lbs  12/25/22 1007 58.5 kg 128.97 lbs  12/24/22 08:07:14 55.4 kg 122.14 lbs   NUTRITION - FOCUSED PHYSICAL EXAM:  Flowsheet Row Most Recent Value  Orbital Region Moderate depletion  Upper Arm Region Moderate depletion  Thoracic and Lumbar Region No depletion  Buccal Region No depletion  Temple Region Moderate depletion  Clavicle Bone Region Moderate depletion  Clavicle and Acromion Bone Region Moderate depletion  Scapular Bone Region Unable to assess  Dorsal Hand Mild depletion   Patellar Region No depletion  Anterior Thigh Region No depletion  Posterior Calf Region Unable to assess  Edema (RD Assessment) Severe  Hair Reviewed  Eyes Reviewed  Mouth Reviewed  Skin Reviewed  Nails Reviewed       Diet Order:   Diet Order             Diet general           Diet regular Room service appropriate? Yes; Fluid consistency: Thin  Diet effective now                   EDUCATION NEEDS:   Education needs have been addressed  Skin:  Skin Assessment: Reviewed RN Assessment  Last BM:  12/29/22  Height:   Ht Readings from Last 1 Encounters:  12/27/22 5\' 3"  (1.6 m)    Weight:   Wt Readings from Last 1 Encounters:  12/30/22 57.2 kg    Ideal Body Weight:     BMI:  Body mass index is 22.34 kg/m.  Estimated Nutritional Needs:   Kcal:  1700-1941 cal/d  Protein:  75-85 g/d  Fluid:  >/=1563ml/day    Jamelle Haring RDN, LDN Clinical Dietitian  RDN pager # available on Amion

## 2022-12-31 NOTE — Progress Notes (Signed)
Mobility Specialist Progress Note:   12/31/22 0950  Mobility  Activity Dangled on edge of bed  Level of Assistance Maximum assist, patient does 25-49%  Assistive Device None  Distance Ambulated (ft)  (unable to d/t lethargy)  Activity Response Tolerated poorly  Mobility Referral Yes  $Mobility charge 1 Mobility  Mobility Specialist Start Time (ACUTE ONLY) 0950  Mobility Specialist Stop Time (ACUTE ONLY) 0959  Mobility Specialist Time Calculation (min) (ACUTE ONLY) 9 min   Pt received asleep in bed. Awoke to light touch, however never opened eyes. Would answer minimal questions. Sat EOB with MaxA, pt stating her L hip hurt. Unsafe to attempt transfer at this time. Pt back supine with all needs met, alarm on.   Addison Lank Mobility Specialist Please contact via SecureChat or  Rehab office at [phone]

## 2022-12-31 NOTE — Progress Notes (Signed)
Stopped blood at 1227p as the patient was complaining of pain at the IV site.  IV site flushed good before giving blood but pt constantly complaining of pain, MD was at bedside and gave orders to stop the blood and remove peripheral IV.  Blood bank notified, told to bring the remaining blood to waste.  Will continue to monitor the patient.

## 2023-01-01 LAB — CBC WITH DIFFERENTIAL/PLATELET
Abs Immature Granulocytes: 0.06 10*3/uL (ref 0.00–0.07)
Basophils Absolute: 0 10*3/uL (ref 0.0–0.1)
Basophils Relative: 1 %
Eosinophils Absolute: 0.2 10*3/uL (ref 0.0–0.5)
Eosinophils Relative: 2 %
HCT: 23.9 % — ABNORMAL LOW (ref 36.0–46.0)
Hemoglobin: 7.3 g/dL — ABNORMAL LOW (ref 12.0–15.0)
Immature Granulocytes: 1 %
Lymphocytes Relative: 16 %
Lymphs Abs: 1.4 10*3/uL (ref 0.7–4.0)
MCH: 32.7 pg (ref 26.0–34.0)
MCHC: 30.5 g/dL (ref 30.0–36.0)
MCV: 107.2 fL — ABNORMAL HIGH (ref 80.0–100.0)
Monocytes Absolute: 0.5 10*3/uL (ref 0.1–1.0)
Monocytes Relative: 6 %
Neutro Abs: 6.4 10*3/uL (ref 1.7–7.7)
Neutrophils Relative %: 74 %
Platelets: 259 10*3/uL (ref 150–400)
RBC: 2.23 MIL/uL — ABNORMAL LOW (ref 3.87–5.11)
RDW: 17.2 % — ABNORMAL HIGH (ref 11.5–15.5)
WBC: 8.6 10*3/uL (ref 4.0–10.5)
nRBC: 2.3 % — ABNORMAL HIGH (ref 0.0–0.2)

## 2023-01-01 LAB — PREPARE RBC (CROSSMATCH)

## 2023-01-01 LAB — GLUCOSE, CAPILLARY
Glucose-Capillary: 86 mg/dL (ref 70–99)
Glucose-Capillary: 128 mg/dL — ABNORMAL HIGH (ref 70–99)
Glucose-Capillary: 159 mg/dL — ABNORMAL HIGH (ref 70–99)
Glucose-Capillary: 124 mg/dL — ABNORMAL HIGH (ref 70–99)

## 2023-01-01 MED ORDER — SODIUM CHLORIDE 0.9% IV SOLUTION: Freq: Once | INTRAVENOUS | Status: DC

## 2023-01-01 MED ORDER — MECLIZINE HCL 25 MG PO TABS
25.0000 mg | ORAL_TABLET | Freq: Two times a day (BID) | ORAL | Status: AC
  Administered 2023-01-01 – 2023-01-02 (×2): 25 mg via ORAL
  Filled 2023-01-01 (×2): qty 1

## 2023-01-01 MED ORDER — MECLIZINE HCL 25 MG PO TABS: 25.0000 mg | ORAL_TABLET | Freq: Three times a day (TID) | ORAL | Status: DC | PRN

## 2023-01-01 NOTE — Progress Notes (Signed)
Physical Therapy Treatment Patient Details Name: Kim Burns MRN: [id number] DOB: [birth year] Today's Date: 01/01/2023   History of Present Illness 87 y.o. F who presented 11/4 with worsening hypotension with dialysis and left leg cellulitis; notable recent admissions, 08/2022 aFib with RVR, early Sept 2024 (found down) with R frontal contusion, skull fxs, developed subdural hematomas, L pubic ramus fx (WBAT), small non-displaced S4 fx, ischemic CVAs; Underwent rehab at [facility] and dc'd to home on 9/28; Afib with RVR 11/8 PMHx:  ESRD on HD MWF, DM, RCC s/p ablation, IPMN s/p Whipple, pAF not on AC, dCHF, hx CVA and OSA not on CPAP.    PT Comments  Pt is slowly working towards goals. Pt is reporting position vertigo that lasts < 1 min. Performed Francee Piccolo darhoffs due to pt unable to tolerated Dix-Hallpike positioning; positive for L BPPV with torsional nystagmus. Performed modified Epley with pt starting laying on L side rolling to R and then turning head ~45 degrees toward floor. Pt was then assisted to sitting and HOB elevated to 45 degrees. Pt was opening her eyes at end of session when previously she stated she did not want to open her eyes because she felt dizzy. Pt Currently requires Min to Mod A for bed mobility that improved with repetition. Continue to recommend skilled physical therapy services 3x/weekly on discharge from acute care hospital setting in order to decrease risk for falls, immobility, injury and rehospitalization.     If plan is discharge home, recommend the following: A little help with walking and/or transfers;A little help with bathing/dressing/bathroom;Assistance with cooking/housework;Assist for transportation;Help with stairs or ramp for entrance     Equipment Recommendations  None recommended by PT       Precautions / Restrictions Precautions Precautions: Fall Restrictions Weight Bearing Restrictions: No     Mobility  Bed Mobility Overal bed mobility: Needs  Assistance Bed Mobility: Rolling Rolling: Min assist   Supine to sit: Mod assist, Min assist Sit to supine: Mod assist, Min assist   General bed mobility comments: Mod A to Min A for trunk to mid line and Mod- Min A for LE for sitting to supine during brand darhoffs. improved with repetitions.    Transfers Overall transfer level: Needs assistance Equipment used: 1 person hand held assist Transfers: Sit to/from Stand Sit to Stand: Min assist      Ambulation/Gait   General Gait Details: not focus of treatment today       Balance Overall balance assessment: Needs assistance Sitting-balance support: No upper extremity supported, Feet supported Sitting balance-Leahy Scale: Fair Sitting balance - Comments: EOB   Standing balance support: Single extremity supported Standing balance-Leahy Scale: Fair Standing balance comment: Hand held assist for balance.      Cognition Arousal: Lethargic Behavior During Therapy: WFL for tasks assessed/performed Overall Cognitive Status: Impaired/Different from baseline Area of Impairment: Attention     Problem Solving: Slow processing, Decreased initiation General Comments: Pt initially not opening eyes after Brand darhoffs pt began opening her eyes           General Comments General comments (skin integrity, edema, etc.): Pt was reporting positional nystagmus lasting 1 min or less. Francee Piccolo darhoff position performed for pt with L > R and torsional nystagmus noted on the L. Performed modified Epley with pt in L side lying brand darhoff position, rolling to the R side and then looking down at floor before sitting. pt was placed in 45 degrees HOB elevated. Pt stated she felt a little better  and was opening eyes by end of session. Prior pt stated she could not open her eyes becuase she was dizzy.      Pertinent Vitals/Pain Pain Assessment Pain Assessment: Faces Faces Pain Scale: Hurts little more Pain Location: LLE, BUEs Pain  Descriptors / Indicators: Discomfort, Grimacing, Aching Pain Intervention(s): Monitored during session, Limited activity within patient's tolerance, Repositioned     PT Goals (current goals can now be found in the care plan section) Acute Rehab PT Goals Patient Stated Goal: Get well PT Goal Formulation: With patient Time For Goal Achievement: 01/09/23 Potential to Achieve Goals: Good Progress towards PT goals: Progressing toward goals    Frequency    Min 1X/week      PT Plan  Continue with current POC       AM-PAC PT "6 Clicks" Mobility   Outcome Measure  Help needed turning from your back to your side while in a flat bed without using bedrails?: A Little Help needed moving from lying on your back to sitting on the side of a flat bed without using bedrails?: A Little Help needed moving to and from a bed to a chair (including a wheelchair)?: A Little Help needed standing up from a chair using your arms (e.g., wheelchair or bedside chair)?: A Little Help needed to walk in hospital room?: A Little Help needed climbing 3-5 steps with a railing? : A Lot 6 Click Score: 17    End of Session Equipment Utilized During Treatment: Gait belt Activity Tolerance: Patient tolerated treatment well Patient left: with call bell/phone within reach;in bed;with bed alarm set Nurse Communication: Mobility status PT Visit Diagnosis: Unsteadiness on feet (R26.81);Other abnormalities of gait and mobility (R26.89);Muscle weakness (generalized) (M62.81);History of falling (Z91.81);Pain;Dizziness and giddiness (R42);BPPV BPPV - Right/Left : Left     Time: 1528-1600 PT Time Calculation (min) (ACUTE ONLY): 32 min  Charges:    $Therapeutic Activity: 23-37 mins PT General Charges $$ ACUTE PT VISIT: 1 Visit                    Harrel Carina, DPT, CLT  Acute Rehabilitation Services Office: [phone] (Secure chat preferred)    Claudia Desanctis 01/01/2023, 4:42 PM

## 2023-01-01 NOTE — TOC Progression Note (Signed)
Transition of Care [facility]) - Progression Note    Patient Details  Name: Kim Burns MRN: [id number] Date of Birth: [birth year]  [facility]) CM/SW Contact  Tom-Johnson, Hershal Coria, RN Phone Number: 01/01/2023, 4:15 PM  Clinical Narrative:     Patient noted with episodes of Delirium, c/o pain d/t pain to IV site. Hgb at 7.3 today, planned on receiving 1 U PRBC in iHD today.   Patient not Medically ready for discharge.  CM will continue to follow as patient progresses with care towards discharge.       Expected Discharge Plan and Services In-house Referral: Clinical Social Work Discharge Planning Services: CM Consult Post Acute Care Choice: Home Health Living arrangements for the past 2 months: Single Family Home Expected Discharge Date: 12/31/22                         HH Arranged: PT, OT, RN, Disease Management HH Agency: [facility] Date [facility] Agency Contacted: 12/27/22 Time HH Agency Contacted: 1042 Representative spoke with at [facility] Agency: Tresa Endo   Social Determinants of Health (SDOH) Interventions SDOH Screenings   Food Insecurity: No Food Insecurity (12/24/2022)  Housing: Low Risk  (12/24/2022)  Transportation Needs: No Transportation Needs (12/24/2022)  Utilities: Not At Risk (12/24/2022)  Tobacco Use: Low Risk  (12/24/2022)    Readmission Risk Interventions    10/30/2022    3:26 PM 08/27/2021   10:16 AM  Readmission Risk Prevention Plan  Transportation Screening Complete Complete  PCP or Specialist Appt within 3-5 Days  Complete  HRI or Home Care Consult  Complete  Social Work Consult for Recovery Care Planning/Counseling  Complete  Palliative Care Screening  Not Applicable  Medication Review Oceanographer) Complete Complete  HRI or Home Care Consult Complete   SW Recovery Care/Counseling Consult Complete   Palliative Care Screening Not Applicable   Skilled Nursing Facility Not Applicable

## 2023-01-01 NOTE — Progress Notes (Signed)
[facility] Progress Note   Subjective:   Pt was fatigued and agitated yesterday, was unable to tolerate PRBC transfusion due to pain in IV. Discussed with PMD and plan was for transfusion on HD today. Hgb 7.3 today but she has been fatigued and Hgb fluctuating around 7 so will transfuse with HD as planned.   Patient is more alert today, reports she is frustrated that she was not able to stand well with therapy. Denies SOB, CP, dizziness, nausea. R forearm is bruised and sore.   Objective Vitals:   12/31/22 1630 12/31/22 2121 01/01/23 0414 01/01/23 0748  BP: (!) 123/29 (!) 161/68 (!) 144/66 (!) 163/77  Pulse: 60 61 64 63  Resp: 17   18  Temp: (!) 97.1 F (36.2 C) 98 F (36.7 C) 98.5 F (36.9 C) 97.9 F (36.6 C)  TempSrc: Oral     SpO2: 95% 90% 100% 99%  Weight:      Height:       Physical Exam General: Alert, elderly female in NAD Heart: RRR, no murmurs, rubs or gallops Lungs: CTA anteriorly, respirations unlabored on RA Abdomen: Soft, non-distended, +BS Extremities: Trace edema b/l lower extremities, + trace dependent edema L arm Dialysis Access: LUE AVF + t/b  Additional Objective Labs: Basic Metabolic Panel: Recent Labs  Lab 12/29/22 0500 12/30/22 0437 12/31/22 0425  NA 132* 130* 132*  K 4.9 4.1 4.1  CL 95* 93* 96*  CO2 20* 26 26  GLUCOSE 120* 112* 86  BUN 28* 30* 15  CREATININE 3.32* 4.02* 2.59*  CALCIUM 9.3 9.3 9.0  PHOS 5.6* 5.9* 4.1   Liver Function Tests: Recent Labs  Lab 12/26/22 0537 12/27/22 0542 12/29/22 0500 12/30/22 0437 12/31/22 0425  AST 16  --   --   --   --   ALT 44  --   --   --   --   ALKPHOS 82  --   --   --   --   BILITOT 0.6  --   --   --   --   PROT 4.2*  --   --   --   --   ALBUMIN 1.9*   < > 1.8* 2.1* 2.4*   < > = values in this interval not displayed.   No results for input(s): "LIPASE", "AMYLASE" in the last 168 hours. CBC: Recent Labs  Lab 12/28/22 0218 12/30/22 0437 12/30/22 1107 12/31/22 0425  01/01/23 0358  WBC 8.2 8.7 8.5 9.8 8.6  NEUTROABS 5.9 7.0  --  7.5 6.4  HGB 7.5* 7.4* 7.3* 7.0* 7.3*  HCT 24.5* 24.4* 23.0* 22.2* 23.9*  MCV 104.3* 104.3* 105.0* 104.7* 107.2*  PLT 182 249 247 239 259   Blood Culture    Component Value Date/Time   SDES BLOOD LEFT ARM 12/23/2022 2331   SDES BLOOD RIGHT ARM 12/23/2022 2331   SPECREQUEST  12/23/2022 2331    BOTTLES DRAWN AEROBIC AND ANAEROBIC Blood Culture adequate volume   SPECREQUEST  12/23/2022 2331    BOTTLES DRAWN AEROBIC AND ANAEROBIC Blood Culture adequate volume   CULT  12/23/2022 2331    NO GROWTH 5 DAYS Performed at [facility], [address], [place], [place] [address]    CULT  12/23/2022 2331    NO GROWTH 5 DAYS Performed at [facility], [address], [place], [place] [address]    REPTSTATUS 12/29/2022 FINAL 12/23/2022 2331   REPTSTATUS 12/29/2022 FINAL 12/23/2022 2331    Cardiac Enzymes:  No results for input(s): "CKTOTAL", "CKMB", "CKMBINDEX", "TROPONINI" in the last 168 hours. CBG: Recent Labs  Lab 12/31/22 0755 12/31/22 1238 12/31/22 1536 12/31/22 2119 01/01/23 0723  GLUCAP 75 124* 75 101* 86   Iron Studies: No results for input(s): "IRON", "TIBC", "TRANSFERRIN", "FERRITIN" in the last 72 hours. @lablastinr3 @ Studies/Results: IR NEPHROSTOMY EXCHANGE LEFT  Result Date: 12/30/2022 INDICATION: 87 year old female with chronic indwelling left percutaneous nephrostomy tube, currently admitted with pain at catheter entry site and possible leakage during flushing. EXAM: FLUOROSCOPIC GUIDED left SIDED NEPHROSTOMY CATHETER EXCHANGE COMPARISON:  None Available. CONTRAST:  A total of 10 mL Isovue-300 administered was administered into the collecting system FLUOROSCOPY TIME:  Six mGy COMPLICATIONS: None immediate. TECHNIQUE: Informed written consent was obtained from the patient after a discussion of the risks, benefits and alternatives to treatment. Questions regarding the procedure were encouraged and  answered. A timeout was performed prior to the initiation of the procedure. The left flank and external portions of existing nephrostomy catheter was prepped and draped in the usual sterile fashion. A sterile drape was applied covering the operative field. Maximum barrier sterile technique with sterile gowns and gloves were used for the procedure. A timeout was performed prior to the initiation of the procedure. A pre procedural spot fluoroscopic image was obtained. A small amount of contrast was injected via the existing nephrostomy catheter demonstrating partial retraction into a minor calyx. The existing nephrostomy catheter was cut and cannulated with an Amplatz wire which was coiled within the renal pelvis. Under intermittent fluoroscopic guidance, the existing nephrostomy catheter was exchanged for a new 10 [place] all-purpose drainage catheter. Limited contrast injection confirmed appropriate positioning within the renal pelvis and a post exchange fluoroscopic image was obtained. The catheter was locked, secured to the skin with an interrupted suture and reconnected to a gravity bag. A dressing was placed. The patient tolerated the procedure well without immediate postprocedural complication. FINDINGS: The existing nephrostomy catheter is partially retracted but within the collecting system. After successful fluoroscopic guided exchange, a new 10French nephrostomy catheter is coiled and locked within the renal pelvis. IMPRESSION: Successful fluoroscopic guided exchange of left 10 French percutaneous nephrostomy catheter. Marliss Coots, MD [facility] Electronically Signed   By: Marliss Coots M.D.   On: 12/30/2022 11:03   Medications:   sodium chloride   Intravenous Once   amantadine  100 mg Oral Weekly   amiodarone  200 mg Oral Daily   aspirin  81 mg Oral Daily   darbepoetin (ARANESP) injection - DIALYSIS  150 mcg Subcutaneous Q Wed-1800    doxercalciferol  4 mcg Intravenous Q M,W,F-HD   feeding supplement  237 mL Oral BID BM   insulin aspart  0-5 Units Subcutaneous QHS   insulin aspart  0-6 Units Subcutaneous TID WC   lipase/protease/amylase  72,000 Units Oral TID WC   metoprolol succinate  12.5 mg Oral Q T,Th,S,Su   midodrine  10 mg Oral Q M,W,F   rOPINIRole  0.25 mg Oral q morning   And   rOPINIRole  0.5 mg Oral QHS   sevelamer carbonate  800 mg Oral TID WC   sodium chloride flush  3 mL Intravenous Q12H    Dialysis Orders: Center: [facility]  on MWF . 160NRe 3 hr 30 min BFR 350 DFR Auto 1.5 EDW 55kg 2K 2Ca AVF 16g  No heparin Mircera IV q 2 weeks- has not received ESA recently  Hectorol IV q HD  Assessment/Plan: 1. Lower  leg cellulitis: improving with antibiotics.  2. ESRD:  On MWF schedule. HD today per regular schedule.  3. A fib - noted after HD on Friday. Possibly provoked by acute on chronic hypotension +ultrafiltration w/HD. Now back in NSR. On amiodarone and metoprolol.  4. Nephrostomy tube - exchanged 12/30/22, reports procedure was painful but back pain is improving 5. Hypertension/volume: BP has been dropping on HD despite midodrine. Takes 10mg  pre-HD and occasionally an extra dose mid HD. Will continue midodrine and add prn order for 1/2 through treatment. LE edema on exam is improved.  UF limited by hypotension but BP seems to be improving  6. Anemia: last Hgb 7.3, unable to give back blood with HD on Friday due to +chlorine test. Increasing aranesp dose. Attempted transfusion yesterday but IV was too painful, will plan for 1 unit PRBC in HD today as she does seem to be symptomatic 7. Metabolic bone disease: Corrected calcium and phos mildly elevated, reducing VDRA.  Continue binders 8. Nutrition:  Alb 2.4-improved , will need renal diet and protein supplements 9. Recent TBI/CIR admission: improved but reports she is still weak. Seems weaker over the past two days, will reach out to primary MD. ? If  anemia is contributing. Generally waxing and waning with multiple hospital admissions lately, improved today. PMD talked to family at length on 11/11.   Rogers Blocker, PA-C 01/01/2023, 8:59 AM  [facility] Pager: [phone]

## 2023-01-01 NOTE — Progress Notes (Signed)
OT Cancellation Note  Patient Details Name: Kim Burns MRN: [id number] DOB: December 10, 1935   Cancelled Treatment:    Reason Eval/Treat Not Completed: (P) Fatigue/lethargy limiting ability to participate, Pt hard to wake up, per RN Pt was feeling better late last night and earlier today, able to eat and sit in recliner. Has not had transfusion yet, will check back tomorrow after HD/transfusion.   Alexis Goodell 01/01/2023, 3:16 PM

## 2023-01-01 NOTE — Progress Notes (Signed)
[facility] Progress Note  Patient: Kim Burns    VOZ:[id number]  DOA: 12/23/2022     Date of Service: the patient was seen and examined on 01/01/2023  Chief Complaint  Patient presents with   Leg Swelling   Brief hospital course: Kim Burns is an 87 y.o. Female with ESRD on HD MWF, DM, RCC s/p ablation, IPMN s/p Whipple, PAF not on AC, dCHF, hx CVA and OSA not on CPAP who presented with worsening hypotension with dialysis and left leg cellulitis.    Assessment and Plan:  # Cellulitis of left lower extremity-resolved as of 12/31/2022 - no purulent drainage; some bruising noted - s/p rocephin x 7 days; seems to be improved from admission    # Physical deconditioning - borderline even failure to thrive at this point  - prolonged hospitalization and minimal participation with PT/OT unfortunately - palliative care has been following and multiple GOC discussions have been held; unfortunately she continues to struggle with much meaningful improvement. I also spoke with [name] at length on 11/11; they are still comfortable bringing her home if she can at least be a little more comfortable and more awake/alert.  She continues to have significant episodes of delirium and today she was almost screaming out in pain and not redirectable because of discomfort at her IV site that was just placed overnight and with the PRBC transfusion today - she just looks worse to me overall; in my medical opinion a hospice candidate but she's been adamant on continuing treatments and HD etc.; I did inform family that she will eventually approach a point (u/k when yet) where her functional status will be so poor that she may no longer be offered dialysis anymore and at this rate she may be approaching it sooner than later - we will have to delay discharge yet again today as she just does not look in any condition to go home today; she also still needs blood with Hgb 7 g/dL today and presumed further in blood  filter loss with HD tomorrow - repeat labs in morning and will need to be arranged to get blood while in HD on 11/13 and hopefully if she's comfortable, mostly oriented, she can go home with family; BUT high risk of readmission and everyone seems to understand this. Unfortunately I do not think she's going to change her intentions until she no longer has a choice in regards to future dialysis    # Hypotension-resolved as of 12/31/2022 - On midodrine on dialysis days and pressures are improved   # Nephrostomy complication - Patient complaining of worsening discomfort around left nephrostomy tube site.  Also noted to have leaking from drainage site when tube being flushed -Patient states was recently replaced on 12/16/2022 - found to be partially retracted; exchanged by IR on 11/11; next exchange in 6 weeks outpatient   # End-stage renal disease on hemodialysis (HCC) - on HD MWF - continue as per nephrology    # Anemia of chronic disease due to ESRD 11/13 Hb 7.3 today Transfuse 1 unit of PRBC as per nephrology during hemodialysis Monitor H&H and transfuse as needed.  # Hypoalbuminemia - s/p albumin   # Elevated LFTs - Hepatic steatosis noted on RUQ ultrasound; hx of CCY - Trend LFTs intermittently   # Paroxysmal atrial fibrillation  - Not on anticoagulation at home -Given episode of A-fib with RVR, resuming Toprol and will monitor blood pressure for tolerance (change to non-HD days) -Continue amiodarone   # Diabetes  mellitus secondary to pancreatectomy (HCC) - continue SSI and CBG monitoring    # Chronic diastolic CHF (congestive heart failure) (HCC) - no s/s exac   # Parkinson disease (HCC) - Continue Requip - Resume amantadine   # Exocrine pancreatic insufficiency - continue creon   # Dizziness most likely peripheral vertigo Started meclizine 25 mg p.o. twice daily x 2 doses followed by 3 times daily as needed Continue fall precautions  Antimicrobials: Cefepime 11/5  x 1 Rocephin 11/5 >> 11/12   Body mass index is 22.34 kg/m.  Nutrition Problem: Moderate Malnutrition Etiology: chronic illness Interventions: Interventions: Ensure Enlive (each supplement provides 350kcal and 20 grams of protein)  Pressure Injury 10/21/22 Buttocks Right Deep Tissue Pressure Injury - Purple or maroon localized area of discolored intact skin or blood-filled blister due to damage of underlying soft tissue from pressure and/or shear. 2 areas of deep tissue pressure in (Active)  10/21/22 1145  Location: Buttocks  Location Orientation: Right  Staging: Deep Tissue Pressure Injury - Purple or maroon localized area of discolored intact skin or blood-filled blister due to damage of underlying soft tissue from pressure and/or shear.  Wound Description (Comments): 2 areas of deep tissue pressure injuries  Present on Admission: Yes     Diet: Regular diet DVT Prophylaxis: SCD   Advance goals of care discussion: Full code  Family Communication: family was not present at bedside, at the time of interview.  The pt provided permission to discuss medical plan with the family. Opportunity was given to ask question and all questions were answered satisfactorily.   Disposition:  Pt is from Home, admitted with left leukoencephalitis, ESRD on hemodialysis, still has generalized weakness, anemia, will receive 1 unit of PRBC with hemodialysis on 11/13, which precludes a safe discharge. Discharge to home with home health, when stable, most likely in 1 to 2 days..  Subjective: No significant overnight events, patient is awake and alert, stated that she feels little bit dizzy while standing up and also feels dizzy while moving her head from side-to-side.  Patient said that orthostatics were negative, blood pressure did not drop while standing when they checked. Denied any chest pain or palpitation, no shortness of breath.  Physical Exam: General: NAD, lying comfortably Appear in no distress,  affect appropriate Eyes: PERRLA ENT: Oral Mucosa Clear, moist  Neck: no JVD,  Cardiovascular: S1 and S2 Present, no Murmur,  Respiratory: good respiratory effort, Bilateral Air entry equal and Decreased, no Crackles, no wheezes Abdomen: Bowel Sound present, Soft and no tenderness,  Skin: no rashes Extremities: no Pedal edema, no calf tenderness Neurologic: without any new focal findings Gait not checked due to patient safety concerns  Vitals:   12/31/22 1630 12/31/22 2121 01/01/23 0414 01/01/23 0748  BP: (!) 123/29 (!) 161/68 (!) 144/66 (!) 163/77  Pulse: 60 61 64 63  Resp: 17   18  Temp: (!) 97.1 F (36.2 C) 98 F (36.7 C) 98.5 F (36.9 C) 97.9 F (36.6 C)  TempSrc: Oral     SpO2: 95% 90% 100% 99%  Weight:      Height:        Intake/Output Summary (Last 24 hours) at 01/01/2023 1502 Last data filed at 01/01/2023 1300 Gross per 24 hour  Intake 585 ml  Output 0 ml  Net 585 ml   Filed Weights   12/27/22 1235 12/30/22 1220 12/30/22 1627  Weight: 55 kg 60.1 kg 57.2 kg    Data Reviewed: I have personally reviewed and interpreted  daily labs, tele strips, imagings as discussed above. I reviewed all nursing notes, pharmacy notes, vitals, pertinent old records I have discussed plan of care as described above with RN and patient/family.  CBC: Recent Labs  Lab 12/27/22 0542 12/27/22 1439 12/28/22 0218 12/30/22 0437 12/30/22 1107 12/31/22 0425 01/01/23 0358  WBC 10.2   < > 8.2 8.7 8.5 9.8 8.6  NEUTROABS 8.1*  --  5.9 7.0  --  7.5 6.4  HGB 7.6*   < > 7.5* 7.4* 7.3* 7.0* 7.3*  HCT 24.2*   < > 24.5* 24.4* 23.0* 22.2* 23.9*  MCV 100.4*   < > 104.3* 104.3* 105.0* 104.7* 107.2*  PLT 156   < > 182 249 247 239 259   < > = values in this interval not displayed.   Basic Metabolic Panel: Recent Labs  Lab 12/26/22 0537 12/27/22 0542 12/28/22 0218 12/29/22 0500 12/30/22 0437 12/31/22 0425  NA 136 135 134* 132* 130* 132*  K 4.0 4.4 4.1 4.9 4.1 4.1  CL 101 98 95* 95* 93*  96*  CO2 23 22 28  20* 26 26  GLUCOSE 96 133* 125* 120* 112* 86  BUN 30* 38* 19 28* 30* 15  CREATININE 3.19* 3.85* 2.53* 3.32* 4.02* 2.59*  CALCIUM 8.3* 8.9 9.2 9.3 9.3 9.0  MG 2.2 2.5* 2.1 2.3 2.5*  --   PHOS  --  6.2* 4.9* 5.6* 5.9* 4.1    Studies: No results found.  Scheduled Meds:  sodium chloride   Intravenous Once   sodium chloride   Intravenous Once   amantadine  100 mg Oral Weekly   amiodarone  200 mg Oral Daily   aspirin  81 mg Oral Daily   darbepoetin (ARANESP) injection - DIALYSIS  150 mcg Subcutaneous Q Wed-1800   doxercalciferol  4 mcg Intravenous Q M,W,F-HD   feeding supplement  237 mL Oral BID BM   insulin aspart  0-5 Units Subcutaneous QHS   insulin aspart  0-6 Units Subcutaneous TID WC   lipase/protease/amylase  72,000 Units Oral TID WC   metoprolol succinate  12.5 mg Oral Q T,Th,S,Su   midodrine  10 mg Oral Q M,W,F   rOPINIRole  0.25 mg Oral q morning   And   rOPINIRole  0.5 mg Oral QHS   sevelamer carbonate  800 mg Oral TID WC   sodium chloride flush  3 mL Intravenous Q12H   Continuous Infusions: PRN Meds: acetaminophen, cycloSPORINE, guaiFENesin, lipase/protease/amylase **AND** lipase/protease/amylase, midodrine, ondansetron (ZOFRAN) IV, mouth rinse  Time spent: 35 minutes  Author: Gillis Santa. MD [facility] 01/01/2023 3:02 PM  To reach On-call, see care teams to locate the attending and reach out to them via [email or web address]. If 7PM-7AM, please contact night-coverage If you still have difficulty reaching the attending provider, please page the [facility] (Director on Call) for [facility] on amion for assistance.

## 2023-01-02 ENCOUNTER — Inpatient Hospital Stay (HOSPITAL_COMMUNITY): Payer: Medicare PPO

## 2023-01-02 LAB — PHOSPHORUS: Phosphorus: 3.7 mg/dL (ref 2.5–4.6)

## 2023-01-02 LAB — GLUCOSE, CAPILLARY
Glucose-Capillary: 78 mg/dL (ref 70–99)
Glucose-Capillary: 153 mg/dL — ABNORMAL HIGH (ref 70–99)

## 2023-01-02 LAB — MAGNESIUM: Magnesium: 1.9 mg/dL (ref 1.7–2.4)

## 2023-01-02 LAB — CBC
HCT: 28.9 % — ABNORMAL LOW (ref 36.0–46.0)
Hemoglobin: 9.2 g/dL — ABNORMAL LOW (ref 12.0–15.0)
MCH: 31.9 pg (ref 26.0–34.0)
MCHC: 31.8 g/dL (ref 30.0–36.0)
MCV: 100.3 fL — ABNORMAL HIGH (ref 80.0–100.0)
Platelets: 228 10*3/uL (ref 150–400)
RBC: 2.88 MIL/uL — ABNORMAL LOW (ref 3.87–5.11)
RDW: 20.5 % — ABNORMAL HIGH (ref 11.5–15.5)
WBC: 7.8 10*3/uL (ref 4.0–10.5)
nRBC: 1.1 % — ABNORMAL HIGH (ref 0.0–0.2)

## 2023-01-02 LAB — BASIC METABOLIC PANEL
Anion gap: 8 (ref 5–15)
BUN: 11 mg/dL (ref 8–23)
CO2: 29 mmol/L (ref 22–32)
Calcium: 8.4 mg/dL — ABNORMAL LOW (ref 8.9–10.3)
Chloride: 93 mmol/L — ABNORMAL LOW (ref 98–111)
Creatinine, Ser: 2.68 mg/dL — ABNORMAL HIGH (ref 0.44–1.00)
GFR, Estimated: 17 mL/min — ABNORMAL LOW (ref >60)
Glucose, Bld: 80 mg/dL (ref 70–99)
Potassium: 3.4 mmol/L — ABNORMAL LOW (ref 3.5–5.1)
Sodium: 130 mmol/L — ABNORMAL LOW (ref 135–145)

## 2023-01-02 MED ORDER — MIDODRINE HCL 10 MG PO TABS: 10.0000 mg | ORAL_TABLET | ORAL | 11 refills | Status: DC

## 2023-01-02 MED ORDER — MECLIZINE HCL 25 MG PO TABS: 25.0000 mg | ORAL_TABLET | Freq: Three times a day (TID) | ORAL | 2 refills | Status: DC | PRN

## 2023-01-02 MED ORDER — HYDRALAZINE HCL 50 MG PO TABS: 50.0000 mg | ORAL_TABLET | Freq: Three times a day (TID) | ORAL | Status: DC | PRN

## 2023-01-02 MED ORDER — DARBEPOETIN ALFA 150 MCG/0.3ML IJ SOSY
150.0000 ug | PREFILLED_SYRINGE | Freq: Once | INTRAMUSCULAR | Status: AC
  Administered 2023-01-02: 150 ug via SUBCUTANEOUS
  Filled 2023-01-02: qty 0.3

## 2023-01-02 MED ORDER — METOPROLOL SUCCINATE ER 25 MG PO TB24: 25.0000 mg | ORAL_TABLET | ORAL | 0 refills | Status: DC

## 2023-01-02 MED ORDER — HYDRALAZINE HCL 50 MG PO TABS: 50.0000 mg | ORAL_TABLET | Freq: Three times a day (TID) | ORAL | 1 refills | Status: DC | PRN

## 2023-01-02 MED ORDER — CHLORHEXIDINE GLUCONATE CLOTH 2 % EX PADS: 6.0000 | MEDICATED_PAD | Freq: Every day | CUTANEOUS | Status: DC

## 2023-01-02 MED ORDER — ACETAMINOPHEN 325 MG PO TABS: 650.0000 mg | ORAL_TABLET | Freq: Four times a day (QID) | ORAL | Status: DC | PRN

## 2023-01-02 NOTE — Progress Notes (Signed)
DISCHARGE NOTE HOME Kim Burns to be discharged Home per MD order. Discussed prescriptions and follow up appointments with the patient. Prescriptions given to patient; medication list explained in detail. Patient verbalized understanding.  Skin clean, dry and intact without evidence of skin break down, no evidence of skin tears noted. IV catheter discontinued intact. Site without signs and symptoms of complications. Dressing and pressure applied. Pt denies pain at the site currently. No complaints noted.  Patient free of lines, drains, and wounds.   An After Visit Summary (AVS) was printed and given to the patient. Patient escorted via wheelchair, and discharged home via private auto.  Velia Meyer, RN

## 2023-01-02 NOTE — Progress Notes (Signed)
[facility] Progress Note   Subjective:   Pt tolerated dialysis well last night with 2L UF. She reports she was very tired and disoriented this morning, but is feeling better now. Denies SOB, CP, dizziness, nausea.   Objective Vitals:   01/02/23 0155 01/02/23 0326 01/02/23 0628 01/02/23 0751  BP: (!) 148/68 (!) 143/70 (!) 165/67 (!) 173/82  Pulse: 60 63 65 82  Resp: 19 16  17   Temp: (!) 97.5 F (36.4 C) 98 F (36.7 C) 97.9 F (36.6 C) 98 F (36.7 C)  TempSrc: Axillary     SpO2: 100% 100% 98% 100%  Weight:      Height:       Physical Exam General: Alert, elderly female in NAD Heart: RRR, no murmurs, rubs or gallops Lungs: CTA bilaterally Abdomen: Soft non-distended +BS Extremities: trace edema b/l lower extremities, trace dependent edema LUE Dialysis Access:  LUE AVF + t/b  Additional Objective Labs: Basic Metabolic Panel: Recent Labs  Lab 12/30/22 0437 12/31/22 0425 01/02/23 0555  NA 130* 132* 130*  K 4.1 4.1 3.4*  CL 93* 96* 93*  CO2 26 26 29   GLUCOSE 112* 86 80  BUN 30* 15 11  CREATININE 4.02* 2.59* 2.68*  CALCIUM 9.3 9.0 8.4*  PHOS 5.9* 4.1 3.7   Liver Function Tests: Recent Labs  Lab 12/29/22 0500 12/30/22 0437 12/31/22 0425  ALBUMIN 1.8* 2.1* 2.4*   No results for input(s): "LIPASE", "AMYLASE" in the last 168 hours. CBC: Recent Labs  Lab 12/30/22 0437 12/30/22 1107 12/31/22 0425 01/01/23 0358 01/02/23 0555  WBC 8.7 8.5 9.8 8.6 7.8  NEUTROABS 7.0  --  7.5 6.4  --   HGB 7.4* 7.3* 7.0* 7.3* 9.2*  HCT 24.4* 23.0* 22.2* 23.9* 28.9*  MCV 104.3* 105.0* 104.7* 107.2* 100.3*  PLT 249 247 239 259 228   Blood Culture    Component Value Date/Time   SDES BLOOD LEFT ARM 12/23/2022 2331   SDES BLOOD RIGHT ARM 12/23/2022 2331   SPECREQUEST  12/23/2022 2331    BOTTLES DRAWN AEROBIC AND ANAEROBIC Blood Culture adequate volume   SPECREQUEST  12/23/2022 2331    BOTTLES DRAWN AEROBIC AND ANAEROBIC Blood Culture adequate volume   CULT   12/23/2022 2331    NO GROWTH 5 DAYS Performed at [facility], [address], [place], [place] [address]    CULT  12/23/2022 2331    NO GROWTH 5 DAYS Performed at [facility], [address], [place], [place] [address]    REPTSTATUS 12/29/2022 FINAL 12/23/2022 2331   REPTSTATUS 12/29/2022 FINAL 12/23/2022 2331    Cardiac Enzymes: No results for input(s): "CKTOTAL", "CKMB", "CKMBINDEX", "TROPONINI" in the last 168 hours. CBG: Recent Labs  Lab 01/01/23 0723 01/01/23 1120 01/01/23 1608 01/01/23 2119 01/02/23 0749  GLUCAP 86 128* 159* 124* 78   Iron Studies: No results for input(s): "IRON", "TIBC", "TRANSFERRIN", "FERRITIN" in the last 72 hours. @lablastinr3 @ Studies/Results: No results found. Medications:   sodium chloride   Intravenous Once   sodium chloride   Intravenous Once   amantadine  100 mg Oral Weekly   amiodarone  200 mg Oral Daily   aspirin  81 mg Oral Daily   darbepoetin (ARANESP) injection - DIALYSIS  150 mcg Subcutaneous Q Wed-1800   doxercalciferol  4 mcg Intravenous Q M,W,F-HD   feeding supplement  237 mL Oral BID BM   insulin aspart  0-5 Units Subcutaneous QHS   insulin aspart  0-6 Units Subcutaneous TID WC  lipase/protease/amylase  72,000 Units Oral TID WC   metoprolol succinate  12.5 mg Oral Q T,Th,S,Su   midodrine  10 mg Oral Q M,W,F   rOPINIRole  0.25 mg Oral q morning   And   rOPINIRole  0.5 mg Oral QHS   sevelamer carbonate  800 mg Oral TID WC   sodium chloride flush  3 mL Intravenous Q12H    Outpatient Dialysis Orders: Center: [facility]  on MWF . 160NRe 3 hr 30 min BFR 350 DFR Auto 1.5 EDW 55kg 2K 2Ca AVF 16g  No heparin Mircera IV q 2 weeks- has not received ESA recently  Hectorol IV q HD    Assessment/Plan: 1. Lower leg cellulitis: improving with antibiotics.  2. ESRD:  On MWF schedule. HD tomorrow per regular schedule.  3. A fib - noted after HD on Friday. Possibly provoked by acute on chronic hypotension  +ultrafiltration w/HD. Now back in NSR. On amiodarone and metoprolol.  4. Nephrostomy tube - exchanged 12/30/22 5. Hypertension/volume: BP had been dropping on HD despite midodrine. Takes 10mg  pre-HD and occasionally an extra dose mid HD. BP is now improved, actually running a bit high. Edema significantly improved.  6. Anemia: unable to give back blood with HD on Friday due to +chlorine test. Increased aranesp dose. Received 1 unit PRBC for symptomatic anemia, Hgb improved.  7. Metabolic bone disease: Corrected calcium and phos mildly elevated, reduced VDRA and improved.  Continue binders 8. Nutrition:  Alb 2.4-improved , will need renal diet and protein supplements. She does not like meat, advised it is ok to eat plant based protein sources even if they have some phosphorus, will just need to take her binders with her meals 9. Recent TBI/CIR admission: improved but reports she is still weak. Seems to be waxing and waning but improved the past two days    Rogers Blocker, PA-C 01/02/2023, 9:43 AM  [facility] Pager: [phone]

## 2023-01-02 NOTE — Progress Notes (Addendum)
Case discussed with attending, nephrologist, and renal PA. Pt is for possible d/c later today pending scan result. Contacted [facility] to advise clinic if pt's possible d/c later today and for possible resumption of care for tomorrow. Will assist as needed.   Olivia Canter Renal Navigator [phone]  Addendum at 4:36 pm: D/C order noted. Contacted [facility] to advise clinic of pt's d/c today and that pt will resume tomorrow.

## 2023-01-02 NOTE — Progress Notes (Signed)
Occupational Therapy Treatment Patient Details Name: Kim Burns MRN: [id number] DOB: 30-Aug-1935 Today's Date: 01/02/2023   History of present illness 87 y.o. F who presented 11/4 with worsening hypotension with dialysis and left leg cellulitis; notable recent admissions, 08/2022 aFib with RVR, early Sept 2024 (found down) with R frontal contusion, skull fxs, developed subdural hematomas, L pubic ramus fx (WBAT), small non-displaced S4 fx, ischemic CVAs; Underwent rehab at [facility] and dc'd to home on 9/28; Afib with RVR 11/8 PMHx:  ESRD on HD MWF, DM, RCC s/p ablation, IPMN s/p [place], pAF not on AC, dCHF, hx CVA and OSA not on CPAP.   OT comments  Pt feeling better today, able to be fully alert during session. Pt states last two days were a blur, very lethargic, minimal participation in therapy, but after HD last night Pt has more energy and able to ambulate down hall ~50 feet with CGA using RW. Pt closer to baseline with ADLs at bedside, min A/set up. HHOT still recommended if Pt has consistent support at home to assist for all OOB activities when needed. Pt states she has all DME needed to remain safe at home.       If plan is discharge home, recommend the following:  A little help with walking and/or transfers;A little help with bathing/dressing/bathroom;Assistance with cooking/housework;Assist for transportation;Help with stairs or ramp for entrance   Equipment Recommendations  None recommended by OT    Recommendations for Other Services      Precautions / Restrictions Precautions Precautions: Fall Restrictions Weight Bearing Restrictions: No       Mobility Bed Mobility Overal bed mobility: Needs Assistance Bed Mobility: Supine to Sit, Sit to Supine Rolling: Min assist   Supine to sit: Mod assist Sit to supine: Mod assist   General bed mobility comments: light mod A for in/out of bed.    Transfers Overall transfer level: Needs assistance Equipment used: Rolling walker  (2 wheels) Transfers: Sit to/from Stand, Bed to chair/wheelchair/BSC Sit to Stand: Min assist     Step pivot transfers: Contact guard assist     General transfer comment: min A to power STS, CGA for mobility     Balance Overall balance assessment: Needs assistance Sitting-balance support: No upper extremity supported, Feet supported Sitting balance-Leahy Scale: Fair Sitting balance - Comments: EOB ADLs   Standing balance support: Single extremity supported, During functional activity Standing balance-Leahy Scale: Fair Standing balance comment: able to stand with suport of RW, able to reach out towards door to open while standing                           ADL either performed or assessed with clinical judgement   ADL Overall ADL's : Needs assistance/impaired Eating/Feeding: Set up;Sitting   Grooming: Set up;Sitting   Upper Body Bathing: Set up;Sitting   Lower Body Bathing: Minimal assistance;Sit to/from stand   Upper Body Dressing : Set up;Sitting   Lower Body Dressing: Minimal assistance;Sit to/from stand   Toilet Transfer: Minimal assistance;Rolling walker (2 wheels)   Toileting- Clothing Manipulation and Hygiene: Contact guard assist;Sitting/lateral lean       Functional mobility during ADLs: Contact guard assist;Rolling walker (2 wheels) General ADL Comments: Pt feeling better today, closer to baseline but still fatigued and weak.    Extremity/Trunk Assessment Upper Extremity Assessment Upper Extremity Assessment: Generalized weakness            Vision       Perception  Praxis      Cognition Arousal: Alert Behavior During Therapy: WFL for tasks assessed/performed Overall Cognitive Status: Within Functional Limits for tasks assessed                                 General Comments: Pt was able to fully participate. Pt states the last two days were a blur due to severe lethargy, but started feeling better last night after  HD and transfusion.        Exercises      Shoulder Instructions       General Comments      Pertinent Vitals/ Pain       Pain Assessment Pain Assessment: No/denies pain  Home Living                                          Prior Functioning/Environment              Frequency  Min 1X/week        Progress Toward Goals  OT Goals(current goals can now be found in the care plan section)  Progress towards OT goals: Progressing toward goals  Acute Rehab OT Goals Patient Stated Goal: to improve endurance OT Goal Formulation: With patient Time For Goal Achievement: 01/08/23 Potential to Achieve Goals: Good ADL Goals Pt Will Transfer to Toilet: with modified independence;ambulating Pt/caregiver will Perform Home Exercise Program: Increased strength;Both right and left upper extremity;With written HEP provided;With theraband;Independently Additional ADL Goal #1: Pt will be able to tolerate 5 minutes of continuous OOB activities to improve activity tolerance.  Plan      Co-evaluation                 AM-PAC OT "6 Clicks" Daily Activity     Outcome Measure   Help from another person eating meals?: A Little Help from another person taking care of personal grooming?: A Little Help from another person toileting, which includes using toliet, bedpan, or urinal?: A Little Help from another person bathing (including washing, rinsing, drying)?: A Little Help from another person to put on and taking off regular upper body clothing?: A Little Help from another person to put on and taking off regular lower body clothing?: A Little 6 Click Score: 18    End of Session Equipment Utilized During Treatment: Gait belt;Rolling walker (2 wheels)  OT Visit Diagnosis: Unsteadiness on feet (R26.81);Other abnormalities of gait and mobility (R26.89);Muscle weakness (generalized) (M62.81);History of falling (Z91.81);Pain Pain - Right/Left: Left Pain - part of  body: Ankle and joints of foot   Activity Tolerance Patient tolerated treatment well   Patient Left in bed;with call bell/phone within reach   Nurse Communication Mobility status        Time: 1351-1417 OT Time Calculation (min): 26 min  Charges: OT General Charges $OT Visit: 1 Visit OT Treatments $Self Care/Home Management : 8-22 mins $Therapeutic Activity: 8-22 mins  Jaquitta Dupriest, OTR/L   Alexis Goodell 01/02/2023, 2:57 PM

## 2023-01-02 NOTE — TOC Transition Note (Signed)
Transition of Care [facility]) - CM/SW Discharge Note   Patient Details  Name: Kim Burns MRN: [id number] Date of Birth: February 25, 1935  Transition of [facility]) CM/SW Contact:  Tom-Johnson, Hershal Coria, RN Phone Number: 01/02/2023, 4:08 PM   Clinical Narrative:     Patient is scheduled for discharge today.  Readmission Risk Assessment done. Home Health info, Outpatient referral, hospital f/u and discharge instructions on AVS. Son, [name] to transport at discharge.  No further TOC needs noted.          Final next level of care: Home w Home Health Services Barriers to Discharge: Barriers Resolved   Patient Goals and CMS Choice CMS Medicare.gov Compare Post Acute Care list provided to:: Patient Choice offered to / list presented to : Patient, Adult Children (Son, [name])  Discharge Placement                  Patient to be transferred to facility by: Son Name of family member notified: [name]    Discharge Plan and Services Additional resources added to the After Visit Summary for   In-house Referral: Clinical Social Work Discharge Planning Services: CM Consult Post Acute Care Choice: Home Health          DME Arranged: N/A DME Agency: NA       HH Arranged: PT, OT, RN, Disease Management HH Agency: [facility] Date HH Agency Contacted: 12/27/22 Time HH Agency Contacted: 1042 Representative spoke with at [facility] Agency: Tresa Endo  Social Determinants of Health (SDOH) Interventions SDOH Screenings   Food Insecurity: No Food Insecurity (12/24/2022)  Housing: Low Risk  (12/24/2022)  Transportation Needs: No Transportation Needs (12/24/2022)  Utilities: Not At Risk (12/24/2022)  Tobacco Use: Low Risk  (12/24/2022)     Readmission Risk Interventions    01/02/2023    4:07 PM 10/30/2022    3:26 PM 08/27/2021   10:16 AM  Readmission Risk Prevention Plan  Transportation Screening Complete Complete Complete  PCP or Specialist Appt within 3-5 Days    Complete  HRI or Home Care Consult   Complete  Social Work Consult for Recovery Care Planning/Counseling   Complete  Palliative Care Screening   Not Applicable  Medication Review [employer]) Referral to Pharmacy Complete Complete  HRI or Home Care Consult Complete Complete   SW Recovery Care/Counseling Consult Complete Complete   Palliative Care Screening Not Applicable Not Applicable   Skilled Nursing Facility Not Applicable Not Applicable

## 2023-01-02 NOTE — Progress Notes (Signed)
[facility] Patient Discharge Orders- [facility] CLINIC: [facility]  Patient's name: Kim Burns Admit/DC Dates: 12/23/2022 -   Discharge Diagnoses: lower extremity cellulitis      Aranesp: Given: yes   Date and amount of last dose: on 01/01/23  Last Hgb: 9.2 PRBC's Given: yes Date/# of units: 1 unit on 01/01/23 ESA dose for discharge: mircera 150 mcg IV q 2 weeks  IV Iron dose at discharge: no  Heparin change: no  EDW Change: no but call if she is below prior EDW New EDW:   Bath Change: no  Access intervention/Change: no Details:  Hectorol/Calcitriol change: yes, hectorol reduced to IV q HD  Discharge Labs: Calcium8.4 Phosphorus 3.7 Albumin 4.1 K+ 3.4  IV Antibiotics: no Details:  On Coumadin?: no Last INR: Next INR: Managed By:   OTHER/APPTS/LAB ORDERS:    D/C Meds to be reconciled by nurse after every discharge.  Completed By:   Reviewed by: MD:______ RN_______

## 2023-01-02 NOTE — Progress Notes (Signed)
Received patient in bed to unit.  Alert and oriented.  Informed consent signed and in chart.   TX duration: 3:00  Patient tolerated well.  Transported back to the room  Alert, without acute distress.  Hand-off given to patient's nurse.   Access used: LUE AVF Access issues: None  Total UF removed: 2000 mL Medication(s) given: 1 unit PRBC; 4 mcg Hectorol Post HD VS: please data see insert    01/02/23 0155  Vitals  Temp (!) 97.5 F (36.4 C)  Temp Source Axillary  BP (!) 148/68  MAP (mmHg) 89  BP Location Right Arm  BP Method Automatic  Patient Position (if appropriate) Lying  Pulse Rate 60  Pulse Rate Source Monitor  ECG Heart Rate 60  Resp 19  Oxygen Therapy  SpO2 100 %  O2 Device Room Air  Patient Activity (if Appropriate) In bed  Pulse Oximetry Type Continuous  Post Treatment  Dialyzer Clearance Lightly streaked  Hemodialysis Intake (mL) 0 mL  Liters Processed 72  Fluid Removed (mL) 2000 mL  Tolerated HD Treatment Yes  Post-Hemodialysis Comments Treatmen completed and blood returned without issue.  AVG/AVF Arterial Site Held (minutes) 6 minutes (Gauze applied and secured with paper tape; hemostasis achieved.)  AVG/AVF Venous Site Held (minutes) 6 minutes (Gauze applied and secured with paper tape; hemostasis achieved.)  Fistula / Graft Left Upper arm Arteriovenous fistula  Placement Date/Time: 04/24/17 1031   Placed prior to admission: No  Orientation: Left  Access Location: Upper arm  Access Type: (c) Arteriovenous fistula  Site Condition No complications  Fistula / Graft Assessment Present;Thrill;Bruit  Status Flushed;Patent;Deaccessed  Drainage Description None      [facility]

## 2023-01-02 NOTE — Discharge Summary (Signed)
[facility] Discharge Summary   Patient: Kim Burns VHQ:[id number]  PCP: Lewis Moccasin, MD  Date of admission: 12/23/2022   Date of discharge:  01/02/2023     Discharge Diagnoses:  Active Problems:   Physical deconditioning   End-stage renal disease on hemodialysis (HCC)   Nephrostomy complication (HCC)   Hypoalbuminemia   Paroxysmal atrial fibrillation (HCC)   Elevated LFTs   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)   Chronic diastolic CHF (congestive heart failure) (HCC)   Coronary artery disease involving native coronary artery of native heart without angina pectoris   Exocrine pancreatic insufficiency   Malnutrition of moderate degree   Cerebrovascular disease   OSA (obstructive sleep apnea)   Parkinson disease (HCC)   Admitted From: Home Disposition:  Home with [facility]  Recommendations for Outpatient Follow-up:  Follow-up with PCP in 1 week.  Repeat CBC and BMP after 1 week.  Continue to monitor BP and titrate medications accordingly. Follow-up with nephrology and continue hemodialysis as per schedule Monday Wednesday Friday. Follow-up with urologist and IR for left nephrostomy tube change in 6 weeks on 02/10/2023 Follow up LABS/TEST:  As above   Follow-up Information     [facility] Follow up.   Specialty: Home Health Services Why: Someone will call you to schedule first home visit. Contact information: [address] [place] [address] [phone]         [facility] Follow up.   Contact information: [address] [place] [address] [phone]        Lewis Moccasin, MD Follow up in 1 week(s).   Specialty: Family Medicine Contact information: [address] [place] [address] [phone]                Diet recommendation: Renal diet  Activity: The patient is advised to gradually reintroduce usual activities, as tolerated  Discharge Condition:  stable  Code Status: Full code   History of present illness: As per the H and P dictated on admission Hospital Course:  Ms. Nowlen is an 87 y.o. Female with ESRD on HD MWF, DM, RCC s/p ablation, IPMN s/p Whipple, PAF not on AC, dCHF, hx CVA and OSA not on CPAP who presented with worsening hypotension with dialysis and left leg cellulitis.      Assessment and Plan:   # Cellulitis of left lower extremity-resolved as of 12/31/2022 - no purulent drainage; some bruising noted.  s/p rocephin x 7 days; seems to be improved from admission     # Hypotension-resolved as of 12/31/2022 - On midodrine on dialysis days and pressures are improved # Nephrostomy complication - Patient complaining of worsening discomfort around left nephrostomy tube site.  Also noted to have leaking from drainage site when tube being flushed -Patient states was recently replaced on 12/16/2022 - found to be partially retracted; exchanged by IR on 11/11; next exchange in 6 weeks outpatient   # End-stage renal disease on hemodialysis (HCC) on HD MWF, continue as per nephrology  # Anemia of chronic disease due to ESRD 11/13 Hb 7.3, Transfuse 1 unit of PRBC as per nephrology during hemodialysis. 11/14 Hb 9.2 improved after transfusion. # Hypoalbuminemia s/p albumin # Elevated LFTs: Hepatic steatosis noted on RUQ ultrasound; hx of CCY.  # Paroxysmal atrial fibrillation: Not on anticoagulation at home. Given episode of A-fib with RVR, resuming Toprol and will monitor blood pressure for tolerance (change to non-HD days) Continue amiodarone # 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:  Resumed home dose insulin, continue diabetic diet, monitor CBG and follow with PCP for further management. # Chronic diastolic CHF (congestive heart failure) (HCC) no s/s exac # Parkinson disease: Continue Requip, and amantadine # Exocrine pancreatic insufficiency: continue creon  # Dizziness most likely peripheral vertigo. Increased   meclizine 25 mg p.o. 3 times daily as needed. Continue fall precautions  # Physical deconditioning: Due to comorbidities and prolonged hospitalization.  Patient is frail and unable to participate with PT and OT.  Multiple discussions were done with family, palliative care was involved and patient remains full code.  Still family would like her to discharge home with home care.  Patient had an episode of delirium over the weekend and during blood transfusion.  She received blood transfusion on 11/13 without any complications or side effects. Patient also tolerated hemodialysis well last night. 11/14 discussed with patient's son who wanted CT head to be done to rule out acute stroke as patient had an episode of delirium over the weekend. CT head did not show any acute findings, chronic sequelae of right frontal hemorrhage. Patient's son agreed with the discharge planning.  Antimicrobials: Cefepime 11/5 x 1 Rocephin 11/5 >> 11/12   Body mass index is 22.34 kg/m.  Nutrition Problem: Moderate Malnutrition Etiology: chronic illness Nutrition Interventions: Interventions: Ensure Enlive (each supplement provides 350kcal and 20 grams of protein)  Pressure Injury 10/21/22 Buttocks Right Deep Tissue Pressure Injury - Purple or maroon localized area of discolored intact skin or blood-filled blister due to damage of underlying soft tissue from pressure and/or shear. 2 areas of deep tissue pressure in (Active)  10/21/22 1145  Location: Buttocks  Location Orientation: Right  Staging: Deep Tissue Pressure Injury - Purple or maroon localized area of discolored intact skin or blood-filled blister due to damage of underlying soft tissue from pressure and/or shear.  Wound Description (Comments): 2 areas of deep tissue pressure injuries  Present on Admission: Yes     Patient was seen by physical therapy, who recommended Home health, which was arranged. On the day of the discharge the patient's vitals were  stable, and no other acute medical condition were reported by patient. the patient was felt safe to be discharge at Home with Home health.  Consultants: Nephrology and palliative care Procedures: Hemodialysis  Discharge Exam: General: Appear in no distress, Oral Mucosa Clear, moist. Cardiovascular: S1 and S2 Present, no Murmur, Respiratory: normal respiratory effort, Bilateral Air entry present and no Crackles, no wheezes Abdomen: Bowel Sound present, Soft and no tenderness, no hernia Extremities: no Pedal edema, no calf tenderness Neurology: No focal deficits, affect appropriate.  Filed Weights   12/27/22 1235 12/30/22 1220 12/30/22 1627  Weight: 55 kg 60.1 kg 57.2 kg   Vitals:   01/02/23 0628 01/02/23 0751  BP: (!) 165/67 (!) 173/82  Pulse: 65 82  Resp:  17  Temp: 97.9 F (36.6 C) 98 F (36.7 C)  SpO2: 98% 100%    DISCHARGE MEDICATION: Allergies as of 01/02/2023       Reactions   Lopid [gemfibrozil] Other (See Comments)   Myalgia    Lotemax [loteprednol Etabonate] Rash   Skin burning  Blurry vision   Statins Other (See Comments)   Rosuvastatin Muscle weakness   Norco [hydrocodone-acetaminophen] Other (See Comments)   Headaches  Tolerates acetaminophen    Other Diarrhea   Real Butter - diarrhea   Vibra-tab [doxycycline] Other (See Comments)   Unknown reaction   Amlodipine Other (See Comments)   Norvasc  Codeine Other (See Comments)   headache   Hydrocodone Other (See Comments)   Hydrocodone-acetaminophen Other (See Comments)   Headache. Tolerates acetaminophen.   Lisinopril Other (See Comments)   Verapamil Other (See Comments)        Medication List     STOP taking these medications    benzonatate 100 MG capsule Commonly known as: TESSALON   guaiFENesin 600 MG 12 hr tablet Commonly known as: MUCINEX   lidocaine 5 % Commonly known as: LIDODERM       TAKE these medications    acetaminophen 325 MG tablet Commonly known as: TYLENOL Take 2  tablets (650 mg total) by mouth every 6 (six) hours as needed for mild pain (pain score 1-3), moderate pain (pain score 4-6), fever or headache. What changed:  medication strength how much to take when to take this reasons to take this   amantadine 100 MG capsule Commonly known as: SYMMETREL Take 1 capsule (100 mg total) by mouth once a week.   amiodarone 200 MG tablet Commonly known as: PACERONE Take 1 tablet (200 mg total) by mouth daily.   Aspirin Low Dose 81 MG chewable tablet Generic drug: aspirin Chew 1 tablet (81 mg total) by mouth daily. What changed: additional instructions   Creon 36000 UNITS Cpep capsule Generic drug: lipase/protease/amylase Take 36,000 Units by mouth See admin instructions. Take 36,000 units (1 capsule) by mouth with meals and snacks.   cycloSPORINE 0.05 % ophthalmic emulsion Commonly known as: RESTASIS Place 1 drop into both eyes daily as needed (irritation). What changed: reasons to take this   DIALYVITE 800 WITH ZINC 0.8 MG Tabs Take 1 tablet by mouth daily.   diphenhydramine-acetaminophen 25-500 MG Tabs tablet Commonly known as: TYLENOL PM Take 2 tablets by mouth at bedtime.   hydrALAZINE 50 MG tablet Commonly known as: APRESOLINE Take 1 tablet (50 mg total) by mouth every 8 (eight) hours as needed (If systolic BP greater than 160 mmHg).   lidocaine-prilocaine cream Commonly known as: EMLA Apply 1 Application topically every Monday, Wednesday, and Friday with hemodialysis.   meclizine 25 MG tablet Commonly known as: ANTIVERT Take 1 tablet (25 mg total) by mouth 3 (three) times daily as needed for dizziness. What changed:  medication strength how much to take   metoprolol succinate 25 MG 24 hr tablet Commonly known as: TOPROL-XL Take 1 tablet (25 mg total) by mouth 3 (three) times a week. Take on nondialysis days, Tuesday, Thursday, Saturday and Sunday if systolic BP greater than 130 mmHg Start taking on: January 03, 2023 What  changed:  when to take this additional instructions   midodrine 10 MG tablet Commonly known as: PROAMATINE Take 1 tablet (10 mg total) by mouth See admin instructions. Take 10mg  by mouth before dialysis. Bring with you to dialysis in case another dose is required. May take 10 mg 3 times a day if systolic BP less than 120 mmHg What changed: additional instructions   NovoLOG FlexPen 100 UNIT/ML FlexPen Generic drug: insulin aspart Inject 5-7 Units into the skin See admin instructions. Use 5 units if blood sugar over 200. Use 7 units if blood sugar over 250.   Pataday 0.1 % ophthalmic solution Generic drug: olopatadine Place 1 drop into both eyes 2 (two) times daily as needed for allergies.   rOPINIRole 0.25 MG tablet Commonly known as: REQUIP Take 1 tablet (0.25 mg total) by mouth 2 (two) times daily. What changed:  how much to take when to take this additional  instructions   sevelamer carbonate 800 MG tablet Commonly known as: RENVELA Take 1 tablet (800 mg total) by mouth 3 (three) times daily with meals. What changed:  when to take this additional instructions       Allergies  Allergen Reactions   Lopid [Gemfibrozil] Other (See Comments)    Myalgia    Lotemax [Loteprednol Etabonate] Rash    Skin burning  Blurry vision   Statins Other (See Comments)    Rosuvastatin Muscle weakness   Norco [Hydrocodone-Acetaminophen] Other (See Comments)    Headaches  Tolerates acetaminophen    Other Diarrhea    Real Butter - diarrhea   Vibra-Tab [Doxycycline] Other (See Comments)    Unknown reaction   Amlodipine Other (See Comments)    Norvasc   Codeine Other (See Comments)    headache   Hydrocodone Other (See Comments)   Hydrocodone-Acetaminophen Other (See Comments)    Headache. Tolerates acetaminophen.   Lisinopril Other (See Comments)   Verapamil Other (See Comments)   Discharge Instructions     AMB Referral VBCI Care Management   Complete by: As directed     Hospital Follow up referral/Care Coordination:  Readmission prevention, Misty Stanley reviewed and son was interested in ongoing follow up.  HD MWF [facility] for 4 admissions in 6 month, extreme high risk scores  Primary Care Provider:  Lewis Moccasin, MD   Insurance plan: [facility] Medicare PPO  Please assign to [facility] RN Care Coordinator for post hospital care coordination for readmission prevention follow up calls and assess for further needs.  Questions please call:   Charlesetta Shanks, RN, BSN, CCM [place]  [facility], [facility] Liaison Direct Dial: [phone] or secure chat Email: [email or web address]     Expected date of contact: Urgent - 7 Days   Service: RN Case Management   RN Case Management Service For:  Kidney Failure Diabetes     Call MD for:  difficulty breathing, headache or visual disturbances   Complete by: As directed    Call MD for:  extreme fatigue   Complete by: As directed    Call MD for:  persistant dizziness or light-headedness   Complete by: As directed    Call MD for:  persistant nausea and vomiting   Complete by: As directed    Call MD for:  severe uncontrolled pain   Complete by: As directed    Call MD for:  temperature >100.4   Complete by: As directed    Diet general   Complete by: As directed    Discharge instructions   Complete by: As directed    Follow-up with PCP in 1 week.  Repeat CBC and BMP after 1 week.  Continue to monitor BP and titrate medications accordingly. Follow-up with nephrology and continue hemodialysis as per schedule Monday Wednesday Friday. Follow-up with urologist and IR for left nephrostomy tube change   Increase activity slowly   Complete by: As directed    Increase activity slowly   Complete by: As directed        The results of significant diagnostics from this hospitalization (including imaging, microbiology, ancillary and laboratory) are listed below for reference.     Significant Diagnostic Studies: CT HEAD WO CONTRAST ( )  Result Date: 01/02/2023 CLINICAL DATA:  Transient ischemic attack (TIA) r/o STROKE AMS happend on sunday, family is requesting CTH EXAM: CT HEAD WITHOUT CONTRAST TECHNIQUE: Contiguous axial images were obtained from the base of the skull through the vertex without intravenous contrast.  RADIATION DOSE REDUCTION: This exam was performed according to the departmental dose-optimization program which includes automated exposure control, adjustment of the mA and/or kV according to patient size and/or use of iterative reconstruction technique. COMPARISON:  CT head 10/24/2022. FINDINGS: Brain: No evidence of acute infarction, hemorrhage, hydrocephalus, extra-axial collection or mass lesion/mass effect. Encephalomalacia at the anterior right frontal lobe, sequela of the prior hemorrhage. Vascular: No hyperdense vessel.  Calcific atherosclerosis. Skull: No acute fracture. Sinuses/Orbits: Clear sinuses.  No acute orbital findings. Other: No mastoid effusions. IMPRESSION: 1. No evidence of acute intracranial abnormality. 2. Sequela of prior hemorrhage in the anterior right frontal lobe. Electronically Signed   By: Feliberto Harts M.D.   On: 01/02/2023 15:14   IR NEPHROSTOMY EXCHANGE LEFT  Result Date: 12/30/2022 INDICATION: 87 year old female with chronic indwelling left percutaneous nephrostomy tube, currently admitted with pain at catheter entry site and possible leakage during flushing. EXAM: FLUOROSCOPIC GUIDED left SIDED NEPHROSTOMY CATHETER EXCHANGE COMPARISON:  None Available. CONTRAST:  A total of 10 mL Isovue-300 administered was administered into the collecting system FLUOROSCOPY TIME:  Six mGy COMPLICATIONS: None immediate. TECHNIQUE: Informed written consent was obtained from the patient after a discussion of the risks, benefits and alternatives to treatment. Questions regarding the procedure were encouraged and answered. A timeout was  performed prior to the initiation of the procedure. The left flank and external portions of existing nephrostomy catheter was prepped and draped in the usual sterile fashion. A sterile drape was applied covering the operative field. Maximum barrier sterile technique with sterile gowns and gloves were used for the procedure. A timeout was performed prior to the initiation of the procedure. A pre procedural spot fluoroscopic image was obtained. A small amount of contrast was injected via the existing nephrostomy catheter demonstrating partial retraction into a minor calyx. The existing nephrostomy catheter was cut and cannulated with an Amplatz wire which was coiled within the renal pelvis. Under intermittent fluoroscopic guidance, the existing nephrostomy catheter was exchanged for a new 10 [place] all-purpose drainage catheter. Limited contrast injection confirmed appropriate positioning within the renal pelvis and a post exchange fluoroscopic image was obtained. The catheter was locked, secured to the skin with an interrupted suture and reconnected to a gravity bag. A dressing was placed. The patient tolerated the procedure well without immediate postprocedural complication. FINDINGS: The existing nephrostomy catheter is partially retracted but within the collecting system. After successful fluoroscopic guided exchange, a new 10French nephrostomy catheter is coiled and locked within the renal pelvis. IMPRESSION: Successful fluoroscopic guided exchange of left 10 French percutaneous nephrostomy catheter. Marliss Coots, MD [facility] Radiology Electronically Signed   By: Marliss Coots M.D.   On: 12/30/2022 11:03   DG Abd 1 View  Result Date: 12/26/2022 CLINICAL DATA:  Displaced nephrostomy tube. EXAM: ABDOMEN - 1 VIEW COMPARISON:  Five FINDINGS: Left percutaneous nephrostomy with tip over the left flank. Several coiling material noted adjacent to the tip of the  nephrostomy tube. A cluster of calcific density over the right flank may represent stones in the inferior pole of the right kidney. Right upper quadrant cholecystectomy clips. No free air. No bowel dilatation or evidence of obstruction. Atherosclerotic calcification of the aorta. No acute osseous pathology. Degenerative changes of the spine. IMPRESSION: 1. Left percutaneous nephrostomy with tip over the left flank. 2. Possible right renal stones. Electronically Signed   By: Elgie Collard M.D.   On: 12/26/2022 19:31   US Abdomen Limited RUQ (LIVER/GB)  Result Date:  12/24/2022 CLINICAL DATA:  Elevated LFTs. EXAM: ULTRASOUND ABDOMEN LIMITED RIGHT UPPER QUADRANT COMPARISON:  CT scan 06/20/2022.  Abdominal ultrasound 05/17/2020 FINDINGS: Gallbladder: Surgically absent. Common bile duct: Diameter: 6 mm Liver: Diffusely increased echogenicity of liver parenchyma without a discrete or focal abnormality. Portal vein is patent on color Doppler imaging with normal direction of blood flow towards the liver. Other: Cystic change noted right kidney. IMPRESSION: 1. Diffusely increased echogenicity of liver parenchyma without a discrete or focal abnormality. Imaging features compatible with hepatic steatosis. 2. Status post cholecystectomy. 3. Cystic changes noted in the right kidney, better characterized on previous CT imaging. Electronically Signed   By: Kennith Center M.D.   On: 12/24/2022 06:20   DG Chest 2 View  Result Date: 12/24/2022 CLINICAL DATA:  Lactic acidosis EXAM: CHEST - 2 VIEW COMPARISON:  10/21/2022 FINDINGS: Lungs are well expanded, symmetric, and clear. No pneumothorax or pleural effusion. Cardiac size within normal limits. Pulmonary vascularity is normal. Osseous structures are age-appropriate. No acute bone abnormality. IMPRESSION: No active cardiopulmonary disease. Electronically Signed   By: Helyn Numbers M.D.   On: 12/24/2022 00:05   IR NEPHROSTOMY EXCHANGE LEFT  Result Date:  12/16/2022 INDICATION: 87 year old female with chronic indwelling left percutaneous nephrostomy tube presenting for routine check and exchange. EXAM: FLUOROSCOPIC GUIDED RIGHT SIDED NEPHROSTOMY CATHETER EXCHANGE COMPARISON:  10/22/2022 CONTRAST:  A total of 10 mL Isovue-300 administered was administered into the collecting system FLUOROSCOPY TIME:  Three mGy COMPLICATIONS: None immediate. TECHNIQUE: Informed written consent was obtained from the patient after a discussion of the risks, benefits and alternatives to treatment. Questions regarding the procedure were encouraged and answered. A timeout was performed prior to the initiation of the procedure. The left flank and external portions of existing nephrostomy catheter was prepped and draped in the usual sterile fashion. A sterile drape was applied covering the operative field. Maximum barrier sterile technique with sterile gowns and gloves were used for the procedure. A timeout was performed prior to the initiation of the procedure. A pre procedural spot fluoroscopic image was obtained. A small amount of contrast was injected via the existing nephrostomy catheter demonstrating appropriate positioning within the renal pelvis. The existing nephrostomy catheter was cut and cannulated with an Amplatz wire which was coiled within the renal pelvis. Under intermittent fluoroscopic guidance, the existing nephrostomy catheter was exchanged for a new 10 [place] all-purpose drainage catheter. Limited contrast injection confirmed appropriate positioning within the renal pelvis and a post exchange fluoroscopic image was obtained. The catheter was locked, secured to the skin with an interrupted suture and reconnected to a gravity bag. A dressing was placed. The patient tolerated the procedure well without immediate postprocedural complication. FINDINGS: The existing nephrostomy catheter is appropriately positioned and functioning. After successful fluoroscopic guided exchange,  a new 10French nephrostomy catheter is coiled and locked within the renal pelvis. IMPRESSION: Successful fluoroscopic guided exchange of left 10 French percutaneous nephrostomy catheter. Marliss Coots, MD [facility] Radiology Electronically Signed   By: Marliss Coots M.D.   On: 12/16/2022 16:19    Microbiology: Recent Results (from the past 240 hour(s))  Blood culture (routine x 2)     Status: None   Collection Time: 12/23/22 11:31 PM   Specimen: BLOOD LEFT ARM  Result Value Ref Range Status   Specimen Description BLOOD LEFT ARM  Final   Special Requests   Final    BOTTLES DRAWN AEROBIC AND ANAEROBIC Blood Culture adequate volume   Culture   Final  NO GROWTH 5 DAYS Performed at [facility], [address], [place], [place] [address]    Report Status 12/29/2022 FINAL  Final  Blood culture (routine x 2)     Status: None   Collection Time: 12/23/22 11:31 PM   Specimen: BLOOD RIGHT ARM  Result Value Ref Range Status   Specimen Description BLOOD RIGHT ARM  Final   Special Requests   Final    BOTTLES DRAWN AEROBIC AND ANAEROBIC Blood Culture adequate volume   Culture   Final    NO GROWTH 5 DAYS Performed at [facility], [address], [place], [place] [address]    Report Status 12/29/2022 FINAL  Final  Resp panel by RT-PCR (RSV, Flu A&B, Covid) Peripheral     Status: None   Collection Time: 12/23/22 11:31 PM   Specimen: Peripheral; Nasal Swab  Result Value Ref Range Status   SARS Coronavirus 2 by RT PCR NEGATIVE NEGATIVE Final   Influenza A by PCR NEGATIVE NEGATIVE Final   Influenza B by PCR NEGATIVE NEGATIVE Final    Comment: (NOTE) The Xpert Xpress SARS-CoV-2/FLU/RSV plus assay is intended as an aid in the diagnosis of influenza from Nasopharyngeal swab specimens and should not be used as a sole basis for treatment. Nasal washings and aspirates are unacceptable for Xpert Xpress SARS-CoV-2/FLU/RSV testing.  Fact  Sheet for Patients: BloggerCourse.com  Fact Sheet for Healthcare Providers: SeriousBroker.it  This test is not yet approved or cleared by the [place] FDA and has been authorized for detection and/or diagnosis of SARS-CoV-2 by FDA under an Emergency Use Authorization (EUA). This EUA will remain in effect (meaning this test can be used) for the duration of the COVID-19 declaration under Section 564(b)(1) of the Act, 21 U.S.C. section 360bbb-3(b)(1), unless the authorization is terminated or revoked.     Resp Syncytial Virus by PCR NEGATIVE NEGATIVE Final    Comment: (NOTE) Fact Sheet for Patients: BloggerCourse.com  Fact Sheet for Healthcare Providers: SeriousBroker.it  This test is not yet approved or cleared by the [place] FDA and has been authorized for detection and/or diagnosis of SARS-CoV-2 by FDA under an Emergency Use Authorization (EUA). This EUA will remain in effect (meaning this test can be used) for the duration of the COVID-19 declaration under Section 564(b)(1) of the Act, 21 U.S.C. section 360bbb-3(b)(1), unless the authorization is terminated or revoked.  Performed at [facility], [address], [place], [place] [address]      Labs: CBC: Recent Labs  Lab 12/27/22 0542 12/27/22 1439 12/28/22 0218 12/30/22 0437 12/30/22 1107 12/31/22 0425 01/01/23 0358 01/02/23 0555  WBC 10.2   < > 8.2 8.7 8.5 9.8 8.6 7.8  NEUTROABS 8.1*  --  5.9 7.0  --  7.5 6.4  --   HGB 7.6*   < > 7.5* 7.4* 7.3* 7.0* 7.3* 9.2*  HCT 24.2*   < > 24.5* 24.4* 23.0* 22.2* 23.9* 28.9*  MCV 100.4*   < > 104.3* 104.3* 105.0* 104.7* 107.2* 100.3*  PLT 156   < > 182 249 247 239 259 228   < > = values in this interval not displayed.   Basic Metabolic Panel: Recent Labs  Lab 12/27/22 0542 12/28/22 0218 12/29/22 0500 12/30/22 0437 12/31/22 0425 01/02/23 0555  NA 135  134* 132* 130* 132* 130*  K 4.4 4.1 4.9 4.1 4.1 3.4*  CL 98 95* 95* 93* 96* 93*  CO2 22 28 20* 26 26 29   GLUCOSE 133*  125* 120* 112* 86 80  BUN 38* 19 28* 30* 15 11  CREATININE 3.85* 2.53* 3.32* 4.02* 2.59* 2.68*  CALCIUM 8.9 9.2 9.3 9.3 9.0 8.4*  MG 2.5* 2.1 2.3 2.5*  --  1.9  PHOS 6.2* 4.9* 5.6* 5.9* 4.1 3.7   Liver Function Tests: Recent Labs  Lab 12/27/22 0542 12/28/22 0218 12/29/22 0500 12/30/22 0437 12/31/22 0425  ALBUMIN 1.9* 2.1* 1.8* 2.1* 2.4*   No results for input(s): "LIPASE", "AMYLASE" in the last 168 hours. No results for input(s): "AMMONIA" in the last 168 hours. Cardiac Enzymes: No results for input(s): "CKTOTAL", "CKMB", "CKMBINDEX", "TROPONINI" in the last 168 hours. BNP (last 3 results) Recent Labs    09/14/22 0512 12/23/22 2331  BNP 1,469.0* 1,423.9*   CBG: Recent Labs  Lab 01/01/23 1120 01/01/23 1608 01/01/23 2119 01/02/23 0749 01/02/23 1119  GLUCAP 128* 159* 124* 78 153*    Time spent: 35 minutes  Signed:  Gillis Santa  [facility] 01/02/2023 3:51 PM

## 2023-01-03 DIAGNOSIS — N186 End stage renal disease: Secondary | ICD-10-CM | POA: Diagnosis not present

## 2023-01-03 DIAGNOSIS — Z992 Dependence on renal dialysis: Secondary | ICD-10-CM | POA: Diagnosis not present

## 2023-01-03 DIAGNOSIS — N2581 Secondary hyperparathyroidism of renal origin: Secondary | ICD-10-CM | POA: Diagnosis not present

## 2023-01-03 LAB — BPAM RBC
Blood Product Expiration Date: 202411292359
Blood Product Expiration Date: 202412052359
ISSUE DATE / TIME: 202411121202
ISSUE DATE / TIME: 202411140004
Unit Type and Rh: 6200
Unit Type and Rh: 6200

## 2023-01-03 LAB — TYPE AND SCREEN
ABO/RH(D): A POS
Antibody Screen: NEGATIVE
Unit division: 0
Unit division: 0

## 2023-01-05 ENCOUNTER — Telehealth: Payer: Self-pay | Admitting: Nephrology

## 2023-01-05 NOTE — Telephone Encounter (Signed)
Transition of Care - Initial Contact from Inpatient Facility  Date of discharge: 01/02/23 Date of contact: 01/05/23  Method: Phone Spoke to: Patient  Patient contacted to discuss transition of care from recent inpatient hospitalization. Patient was admitted to [facility] from 11/04-11/14/24... with discharge diagnosis of .Marland Kitchen   Physical deconditioning ./Lower leg cellulitis   The discharge medication list was reviewed. Patient understands the changes and has no concerns.   Patient will return to his/her outpatient HD unit on:   No other concerns at this time.

## 2023-01-06 ENCOUNTER — Telehealth: Payer: Self-pay | Admitting: *Deleted

## 2023-01-06 DIAGNOSIS — N2581 Secondary hyperparathyroidism of renal origin: Secondary | ICD-10-CM | POA: Diagnosis not present

## 2023-01-06 DIAGNOSIS — N186 End stage renal disease: Secondary | ICD-10-CM | POA: Diagnosis not present

## 2023-01-06 DIAGNOSIS — Z992 Dependence on renal dialysis: Secondary | ICD-10-CM | POA: Diagnosis not present

## 2023-01-06 NOTE — Progress Notes (Unsigned)
  Care Coordination  Outreach Note  01/06/2023 Name: EVENIE BALTA MRN: [id number] DOB: 09-20-1935   Care Coordination Outreach Attempts: An unsuccessful telephone outreach was attempted today to offer the patient information about available care coordination services.  Follow Up Plan:  Additional outreach attempts will be made to offer the patient care coordination information and services.   Encounter Outcome:  No Answer  Gwenevere Ghazi  Care Coordination Care Guide  Direct Dial: [phone]

## 2023-01-07 NOTE — Progress Notes (Signed)
  Care Coordination   Note   01/07/2023 Name: Kim Burns MRN: [id number] DOB: 05/12/1935  Kim Burns is a 87 y.o. year old female who sees Lewis Moccasin, MD for primary care. I reached out to Kim Burns by phone today to offer care coordination services.  [name] was given information about Care Coordination services today including:   The Care Coordination services include support from the care team which includes your Nurse Coordinator, Clinical Social Worker, or Pharmacist.  The Care Coordination team is here to help remove barriers to the health concerns and goals most important to you. Care Coordination services are voluntary, and the patient may decline or stop services at any time by request to their care team member.   Care Coordination Consent Status: Patient agreed to services and verbal consent obtained.   Follow up plan:  Telephone appointment with care coordination team member scheduled for:  01/08/23  Encounter Outcome:  Patient Scheduled  [facility] Coordination Care Guide  Direct Dial: [phone]

## 2023-01-08 ENCOUNTER — Other Ambulatory Visit: Payer: Self-pay | Admitting: *Deleted

## 2023-01-08 DIAGNOSIS — L03119 Cellulitis of unspecified part of limb: Secondary | ICD-10-CM | POA: Diagnosis not present

## 2023-01-08 DIAGNOSIS — Z992 Dependence on renal dialysis: Secondary | ICD-10-CM | POA: Diagnosis not present

## 2023-01-08 DIAGNOSIS — N2581 Secondary hyperparathyroidism of renal origin: Secondary | ICD-10-CM | POA: Diagnosis not present

## 2023-01-08 DIAGNOSIS — D509 Iron deficiency anemia, unspecified: Secondary | ICD-10-CM | POA: Diagnosis not present

## 2023-01-08 DIAGNOSIS — R5383 Other fatigue: Secondary | ICD-10-CM | POA: Diagnosis not present

## 2023-01-08 DIAGNOSIS — I10 Essential (primary) hypertension: Secondary | ICD-10-CM | POA: Diagnosis not present

## 2023-01-08 DIAGNOSIS — N186 End stage renal disease: Secondary | ICD-10-CM | POA: Diagnosis not present

## 2023-01-08 DIAGNOSIS — L0390 Cellulitis, unspecified: Secondary | ICD-10-CM | POA: Diagnosis not present

## 2023-01-08 DIAGNOSIS — D649 Anemia, unspecified: Secondary | ICD-10-CM | POA: Diagnosis not present

## 2023-01-08 NOTE — Patient Instructions (Signed)
Care Management   Initial Visit Note  01/08/2023 Name: Kim Burns MRN: [id number] DOB: 12-09-1935  [name] is enrolled in a Managed Medicaid plan: No. Outreach attempt today was successful.   Subjective:   Objective:  Assessment: Kim Burns is a 87 y.o. year old female who sees Lewis Moccasin, MD for primary care. The care management team was consulted for assistance with care management and care coordination needs related to Disease Management and Educational Needs.   Review of patient status, including review of consultants reports, relevant laboratory and other test results, and collaboration with appropriate care team members and the patient's provider was performed as part of comprehensive patient evaluation and provision of care management services.    SDOH (Social Determinants of Health) screening performed today. See Care Plan Entry related to challenges with: None   Goals Addressed             This Visit's Progress    RNCM Care Management Expected Outcomes: Monitor, Self-Manage and Reduce Symptoms: ESRD, DM, AFIB       Current Barriers:  Knowledge Deficits related to plan of care for management of Atrial Fibrillation, DMII, and ESRD  Chronic Disease Management support and education needs related to Atrial Fibrillation, DMII, and ESRD  Patient seen in PCP office today for hospital follow up and patient was concerned with reoccurrence of cellulitis, MD provided patient with a 5 day course of Keflex. Home health ([facility]) scheduled to see patient tomorrow for wound care needs with PT/OT to follow. RNCM advised to keep extremities elevated when sitting and to keep area clean at all times.  RNCM Clinical Goal(s):  Patient will verbalize basic understanding of  Atrial Fibrillation, DMII, and ESRD disease process and self health management plan as evidenced by verbal explanation, recognizing symptoms, lifestyle modifications and daily monitoring take  all medications exactly as prescribed and will call provider for medication related questions as evidenced by compliance with all medications and treatment plan attend all scheduled medical appointments: with primary care provider and specialists as evidenced by keeping all scheduled appointments demonstrate Improved and Ongoing adherence to prescribed treatment plan for Atrial Fibrillation, DMII, and ESRD as evidenced by consistent medication compliance, symptom monitoring, continued lifestyle modifications, and self-management not experience hospital admission as evidenced by review of EMR. Hospital Admissions in last 6 months = 4  through collaboration with RN Care manager, provider, and care team.   Interventions: Evaluation of current treatment plan related to  self management and patient's adherence to plan as established by provider   AFIB Interventions: (Status:  New goal. and Goal on track:  Yes.) Long Term Goal Counseled on increased risk of stroke due to Afib and benefits of anticoagulation for stroke prevention Reviewed importance of adherence to anticoagulant exactly as prescribed. Patient currently only taking aspirin and using Metoprolol for rate/rhythm control. Per son, they are checking her BP Q8H due to medication dosing. Counseled on bleeding risk associated with taking aspirin and importance of self-monitoring for signs/symptoms of bleeding Counseled on avoidance of NSAIDs due to increased bleeding risk with anticoagulants Counseled on importance of regular laboratory monitoring as prescribed    End Stage Renal Disease Interventions:  (Status:  New goal. and Goal on track:  Yes.) Long Term Goal Evaluation of current treatment plan related to chronic kidney disease self management and patient's adherence to plan as established by provider.  ESRD on HD MWF. Patient states she received HD today and her BP bottomed out and  she was placed in trendelenburg and UF was turned off to  complete treatment. She states post treatment she is feeling terrible, weak and having vertigo. Per patient son, the last few treatments her being able to safely tolerate HD has decreased. [facility] has been consulted and will visit with patient on next Wednesday.   Provided education to patient re: stroke prevention, s/s of heart attack and stroke    Reviewed prescribed diet Renal Diet/Diabetic Diet Reviewed medications with patient and discussed importance of compliance    Counseled on adverse effects of illicit drug and excessive alcohol use in patients with chronic kidney disease    Advised patient, providing education and rationale, to monitor blood pressure daily and record, calling PCP for findings outside established parameters    Discussed complications of poorly controlled blood pressure such as heart disease, stroke, circulatory complications, vision complications, kidney impairment, sexual dysfunction    Reviewed scheduled/upcoming provider appointments including    Discussed plans with patient for ongoing care management follow up and provided patient with direct contact information for care management team    Last practice recorded BP readings:  BP Readings from Last 3 Encounters:  01/02/23 (!) 156/70  12/16/22 132/60  11/29/22 (!) 139/49   Most recent eGFR/CrCl: No results found for: "EGFR"  No components found for: "CRCL"    Diabetes Interventions:  (Status:  New goal. and Goal on track:  Yes.) Long Term Goal Assessed patient's understanding of A1c goal: <6.5% Provided education to patient about basic DM disease process. Patient currently uses Libre 2 and states the lowest blood sugar recently was in the 70s and the highest around 130-140s. Fasting glucose this morning was 135. Patient/son express difficulties maintain blood sugar throughout the night and state that peanut butter crackers are often times not sufficient. RNCM advised have a bed time snack that consistent of  protein will help stabilize patient blood sugar. Reviewed medications with patient and discussed importance of medication adherence Counseled on importance of regular laboratory monitoring as prescribed Discussed plans with patient for ongoing care management follow up and provided patient with direct contact information for care management team Provided patient with written educational materials related to hypo and hyperglycemia and importance of correct treatment Review of patient status, including review of consultants reports, relevant laboratory and other test results, and medications completed Lab Results  Component Value Date   HGBA1C 6.1 (H) 10/27/2022    Patient Goals/Self-Care Activities: Take all medications as prescribed Attend all scheduled provider appointments Call pharmacy for medication refills 3-7 days in advance of running out of medications Call provider office for new concerns or questions  take the blood sugar log to all doctor visits keep feet up while sitting wash and dry feet carefully every day keep all lab appointments check blood pressure daily keep a blood pressure log  Follow Up Plan:  Telephone follow up appointment with care management team member scheduled for:  02-06-2023 at 1:45 pm           Follow up plan:  Telephone follow up appointment with care management team member scheduled for:02-06-2023 at 1:45 pm  Ms. Vibert was given information about Care Management services today including:  Care Management services include personalized support from designated clinical staff supervised by a physician, including individualized plan of care and coordination with other care providers 24/7 contact phone numbers for assistance for urgent and routine care needs. The patient may stop CCM services at any time (effective at the end of the  month) by phone call to the office staff.  Patient agreed to services and verbal consent obtained.  Danise Edge,  BSN RN RN Care Manager  [place]  Ambulatory Care Management  Direct Number: [phone]

## 2023-01-08 NOTE — Patient Outreach (Signed)
Care Management   Visit Note  01/08/2023 Name: Kim Burns MRN: [id number] DOB: 07/27/1935  Subjective: Kim Burns is a 87 y.o. year old female who is a primary care patient of Lewis Moccasin, MD. The Care Management team was consulted for assistance.      Engaged with patient spoke with patient by telephone.    Goals Addressed             This Visit's Progress    RNCM Care Management Expected Outcomes: Monitor, Self-Manage and Reduce Symptoms: ESRD, DM, AFIB       Current Barriers:  Knowledge Deficits related to plan of care for management of Atrial Fibrillation, DMII, and ESRD  Chronic Disease Management support and education needs related to Atrial Fibrillation, DMII, and ESRD  Patient seen in PCP office today for hospital follow up and patient was concerned with reoccurrence of cellulitis, MD provided patient with a 5 day course of Keflex. Home health ([facility]) scheduled to see patient tomorrow for wound care needs with PT/OT to follow. RNCM advised to keep extremities elevated when sitting and to keep area clean at all times.  RNCM Clinical Goal(s):  Patient will verbalize basic understanding of  Atrial Fibrillation, DMII, and ESRD disease process and self health management plan as evidenced by verbal explanation, recognizing symptoms, lifestyle modifications and daily monitoring take all medications exactly as prescribed and will call provider for medication related questions as evidenced by compliance with all medications and treatment plan attend all scheduled medical appointments: with primary care provider and specialists as evidenced by keeping all scheduled appointments demonstrate Improved and Ongoing adherence to prescribed treatment plan for Atrial Fibrillation, DMII, and ESRD as evidenced by consistent medication compliance, symptom monitoring, continued lifestyle modifications, and self-management not experience hospital admission as evidenced by  review of EMR. Hospital Admissions in last 6 months = 4  through collaboration with RN Care manager, provider, and care team.   Interventions: Evaluation of current treatment plan related to  self management and patient's adherence to plan as established by provider   AFIB Interventions: (Status:  New goal. and Goal on track:  Yes.) Long Term Goal Counseled on increased risk of stroke due to Afib and benefits of anticoagulation for stroke prevention Reviewed importance of adherence to anticoagulant exactly as prescribed. Patient currently only taking aspirin and using Metoprolol for rate/rhythm control. Per son, they are checking her BP Q8H due to medication dosing. Counseled on bleeding risk associated with taking aspirin and importance of self-monitoring for signs/symptoms of bleeding Counseled on avoidance of NSAIDs due to increased bleeding risk with anticoagulants Counseled on importance of regular laboratory monitoring as prescribed    End Stage Renal Disease Interventions:  (Status:  New goal. and Goal on track:  Yes.) Long Term Goal Evaluation of current treatment plan related to chronic kidney disease self management and patient's adherence to plan as established by provider.  ESRD on HD MWF. Patient states she received HD today and her BP bottomed out and she was placed in trendelenburg and UF was turned off to complete treatment. She states post treatment she is feeling terrible, weak and having vertigo. Per patient son, the last few treatments her being able to safely tolerate HD has decreased. [facility] has been consulted and will visit with patient on next Wednesday.   Provided education to patient re: stroke prevention, s/s of heart attack and stroke    Reviewed prescribed diet Renal Diet/Diabetic Diet Reviewed medications with patient and discussed importance  of compliance    Counseled on adverse effects of illicit drug and excessive alcohol use in patients with chronic kidney  disease    Advised patient, providing education and rationale, to monitor blood pressure daily and record, calling PCP for findings outside established parameters    Discussed complications of poorly controlled blood pressure such as heart disease, stroke, circulatory complications, vision complications, kidney impairment, sexual dysfunction    Reviewed scheduled/upcoming provider appointments including    Discussed plans with patient for ongoing care management follow up and provided patient with direct contact information for care management team    Last practice recorded BP readings:  BP Readings from Last 3 Encounters:  01/02/23 (!) 156/70  12/16/22 132/60  11/29/22 (!) 139/49   Most recent eGFR/CrCl: No results found for: "EGFR"  No components found for: "CRCL"    Diabetes Interventions:  (Status:  New goal. and Goal on track:  Yes.) Long Term Goal Assessed patient's understanding of A1c goal: <6.5% Provided education to patient about basic DM disease process. Patient currently uses Libre 2 and states the lowest blood sugar recently was in the 70s and the highest around 130-140s. Fasting glucose this morning was 135. Patient/son express difficulties maintain blood sugar throughout the night and state that peanut butter crackers are often times not sufficient. RNCM advised have a bed time snack that consistent of protein will help stabilize patient blood sugar. Reviewed medications with patient and discussed importance of medication adherence Counseled on importance of regular laboratory monitoring as prescribed Discussed plans with patient for ongoing care management follow up and provided patient with direct contact information for care management team Provided patient with written educational materials related to hypo and hyperglycemia and importance of correct treatment Review of patient status, including review of consultants reports, relevant laboratory and other test results, and  medications completed Lab Results  Component Value Date   HGBA1C 6.1 (H) 10/27/2022    Patient Goals/Self-Care Activities: Take all medications as prescribed Attend all scheduled provider appointments Call pharmacy for medication refills 3-7 days in advance of running out of medications Call provider office for new concerns or questions  take the blood sugar log to all doctor visits keep feet up while sitting wash and dry feet carefully every day keep all lab appointments check blood pressure daily keep a blood pressure log  Follow Up Plan:  Telephone follow up appointment with care management team member scheduled for:  02-06-2023 at 1:45 pm           Consent to Services:  Patient was given information about care management services, agreed to services, and gave verbal consent to participate.   Plan: Telephone follow up appointment with care management team member scheduled for:02-06-2023 at 1:45 pm  Danise Edge, BSN RN RN Care Manager  [facility]  Ambulatory Care Management  Direct Number: [phone]

## 2023-01-09 DIAGNOSIS — N179 Acute kidney failure, unspecified: Secondary | ICD-10-CM | POA: Diagnosis not present

## 2023-01-09 DIAGNOSIS — N186 End stage renal disease: Secondary | ICD-10-CM | POA: Diagnosis not present

## 2023-01-09 DIAGNOSIS — I5021 Acute systolic (congestive) heart failure: Secondary | ICD-10-CM | POA: Diagnosis not present

## 2023-01-09 DIAGNOSIS — E1322 Other specified diabetes mellitus with diabetic chronic kidney disease: Secondary | ICD-10-CM | POA: Diagnosis not present

## 2023-01-09 DIAGNOSIS — I700 Atherosclerosis of aorta: Secondary | ICD-10-CM | POA: Diagnosis not present

## 2023-01-09 DIAGNOSIS — I480 Paroxysmal atrial fibrillation: Secondary | ICD-10-CM | POA: Diagnosis not present

## 2023-01-09 DIAGNOSIS — E440 Moderate protein-calorie malnutrition: Secondary | ICD-10-CM | POA: Diagnosis not present

## 2023-01-09 DIAGNOSIS — I5032 Chronic diastolic (congestive) heart failure: Secondary | ICD-10-CM | POA: Diagnosis not present

## 2023-01-09 DIAGNOSIS — I132 Hypertensive heart and chronic kidney disease with heart failure and with stage 5 chronic kidney disease, or end stage renal disease: Secondary | ICD-10-CM | POA: Diagnosis not present

## 2023-01-10 ENCOUNTER — Emergency Department (HOSPITAL_COMMUNITY): Payer: Medicare PPO

## 2023-01-10 ENCOUNTER — Encounter (HOSPITAL_COMMUNITY): Payer: Self-pay

## 2023-01-10 ENCOUNTER — Emergency Department (HOSPITAL_COMMUNITY)
Admission: EM | Admit: 2023-01-10 | Discharge: 2023-01-11 | Disposition: A | Discharge status: Home / Self Care | Payer: Medicare PPO | Attending: Emergency Medicine | Admitting: Emergency Medicine

## 2023-01-10 DIAGNOSIS — I700 Atherosclerosis of aorta: Secondary | ICD-10-CM | POA: Diagnosis not present

## 2023-01-10 DIAGNOSIS — E1122 Type 2 diabetes mellitus with diabetic chronic kidney disease: Secondary | ICD-10-CM | POA: Diagnosis not present

## 2023-01-10 DIAGNOSIS — N2581 Secondary hyperparathyroidism of renal origin: Secondary | ICD-10-CM | POA: Diagnosis not present

## 2023-01-10 DIAGNOSIS — Z8673 Personal history of transient ischemic attack (TIA), and cerebral infarction without residual deficits: Secondary | ICD-10-CM | POA: Diagnosis not present

## 2023-01-10 DIAGNOSIS — D649 Anemia, unspecified: Secondary | ICD-10-CM | POA: Insufficient documentation

## 2023-01-10 DIAGNOSIS — R58 Hemorrhage, not elsewhere classified: Secondary | ICD-10-CM

## 2023-01-10 DIAGNOSIS — Z992 Dependence on renal dialysis: Secondary | ICD-10-CM | POA: Insufficient documentation

## 2023-01-10 DIAGNOSIS — S301XXA Contusion of abdominal wall, initial encounter: Secondary | ICD-10-CM | POA: Diagnosis not present

## 2023-01-10 DIAGNOSIS — X58XXXA Exposure to other specified factors, initial encounter: Secondary | ICD-10-CM | POA: Insufficient documentation

## 2023-01-10 DIAGNOSIS — I120 Hypertensive chronic kidney disease with stage 5 chronic kidney disease or end stage renal disease: Secondary | ICD-10-CM | POA: Diagnosis not present

## 2023-01-10 DIAGNOSIS — L03119 Cellulitis of unspecified part of limb: Secondary | ICD-10-CM | POA: Diagnosis not present

## 2023-01-10 DIAGNOSIS — N186 End stage renal disease: Secondary | ICD-10-CM | POA: Diagnosis not present

## 2023-01-10 DIAGNOSIS — S20219A Contusion of unspecified front wall of thorax, initial encounter: Secondary | ICD-10-CM | POA: Insufficient documentation

## 2023-01-10 DIAGNOSIS — S299XXA Unspecified injury of thorax, initial encounter: Secondary | ICD-10-CM | POA: Diagnosis present

## 2023-01-10 DIAGNOSIS — J90 Pleural effusion, not elsewhere classified: Secondary | ICD-10-CM | POA: Diagnosis not present

## 2023-01-10 LAB — CBC
HCT: 29.4 % — ABNORMAL LOW (ref 36.0–46.0)
Hemoglobin: 8.5 g/dL — ABNORMAL LOW (ref 12.0–15.0)
MCH: 32.3 pg (ref 26.0–34.0)
MCHC: 28.9 g/dL — ABNORMAL LOW (ref 30.0–36.0)
MCV: 111.8 fL — ABNORMAL HIGH (ref 80.0–100.0)
Platelets: 173 10*3/uL (ref 150–400)
RBC: 2.63 MIL/uL — ABNORMAL LOW (ref 3.87–5.11)
RDW: 21.3 % — ABNORMAL HIGH (ref 11.5–15.5)
WBC: 9.4 10*3/uL (ref 4.0–10.5)
nRBC: 0.4 % — ABNORMAL HIGH (ref 0.0–0.2)

## 2023-01-10 LAB — BASIC METABOLIC PANEL
Anion gap: 11 (ref 5–15)
BUN: 26 mg/dL — ABNORMAL HIGH (ref 8–23)
CO2: 29 mmol/L (ref 22–32)
Calcium: 8.5 mg/dL — ABNORMAL LOW (ref 8.9–10.3)
Chloride: 99 mmol/L (ref 98–111)
Creatinine, Ser: 2.72 mg/dL — ABNORMAL HIGH (ref 0.44–1.00)
GFR, Estimated: 17 mL/min — ABNORMAL LOW (ref >60)
Glucose, Bld: 172 mg/dL — ABNORMAL HIGH (ref 70–99)
Potassium: 3.3 mmol/L — ABNORMAL LOW (ref 3.5–5.1)
Sodium: 139 mmol/L (ref 135–145)

## 2023-01-10 LAB — TYPE AND SCREEN
ABO/RH(D): A POS
Antibody Screen: NEGATIVE

## 2023-01-10 NOTE — ED Triage Notes (Signed)
Pt is coming in due to her PCP sending her in for a blood transfusion. Son mentions blood work was drawn Wednesday and was told by MD to come in today for a transfusion due to a hemoglobin of 7. Pt is otherwise stable. She was recently admitted here and had received a blood transfusion as well.

## 2023-01-10 NOTE — ED Provider Triage Note (Cosign Needed Addendum)
Emergency Medicine Provider Triage Evaluation Note  Kim Burns , a 87 y.o. female  was evaluated in triage.  Pt complains of abnormal lab.  Was seen by her PCP on Wednesday labs were drawn at that time.  Hemoglobin was found to be 7.  Patient was called today and advised to come here.  Denies rectal bleeding.  Endorses some red-tinged urine.  No recent falls but her family member states she does have some bruising around her breast line.  States it may have been from a caretaker helping her up into the bed.  PCP was concerned that maybe some of the blood is pooling in her skin because of this.  Review of Systems  Positive: See above Negative: See above  Physical Exam  BP (!) 138/110   Pulse 74   Temp 98.2 F (36.8 C)   Resp 18   SpO2 100%  Gen:   Awake, no distress   Resp:  Normal effort  MSK:   Moves extremities without difficulty  Other:  Tenderness about the left lateral chest wall.  Medical Decision Making  Medically screening exam initiated at 6:41 PM.  Appropriate orders placed.  Kim Burns was informed that the remainder of the evaluation will be completed by another provider, this initial triage assessment does not replace that evaluation, and the importance of remaining in the ED until their evaluation is complete.  Work up started   Gareth Eagle, PA-C 01/10/23 1843    Gareth Eagle, PA-C 01/10/23 [phone]
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
## 2023-01-11 NOTE — Discharge Instructions (Signed)
Bruising can take several weeks to fully resolve. Continue your home medications and dialysis. They should repeat your blood counts at your next visit to ensure you do not need additional blood transfusions. Return with any new or suddenly worsening symptoms.

## 2023-01-12 DIAGNOSIS — N186 End stage renal disease: Secondary | ICD-10-CM | POA: Diagnosis not present

## 2023-01-12 DIAGNOSIS — N2581 Secondary hyperparathyroidism of renal origin: Secondary | ICD-10-CM | POA: Diagnosis not present

## 2023-01-12 DIAGNOSIS — Z992 Dependence on renal dialysis: Secondary | ICD-10-CM | POA: Diagnosis not present

## 2023-01-14 DIAGNOSIS — N186 End stage renal disease: Secondary | ICD-10-CM | POA: Diagnosis not present

## 2023-01-14 DIAGNOSIS — Z992 Dependence on renal dialysis: Secondary | ICD-10-CM | POA: Diagnosis not present

## 2023-01-14 DIAGNOSIS — N2581 Secondary hyperparathyroidism of renal origin: Secondary | ICD-10-CM | POA: Diagnosis not present

## 2023-01-15 DIAGNOSIS — E109 Type 1 diabetes mellitus without complications: Secondary | ICD-10-CM | POA: Diagnosis not present

## 2023-01-17 DIAGNOSIS — Z992 Dependence on renal dialysis: Secondary | ICD-10-CM | POA: Diagnosis not present

## 2023-01-17 DIAGNOSIS — N186 End stage renal disease: Secondary | ICD-10-CM | POA: Diagnosis not present

## 2023-01-17 DIAGNOSIS — N2581 Secondary hyperparathyroidism of renal origin: Secondary | ICD-10-CM | POA: Diagnosis not present

## 2023-01-18 DIAGNOSIS — I158 Other secondary hypertension: Secondary | ICD-10-CM | POA: Diagnosis not present

## 2023-01-18 DIAGNOSIS — Z992 Dependence on renal dialysis: Secondary | ICD-10-CM | POA: Diagnosis not present

## 2023-01-18 DIAGNOSIS — N186 End stage renal disease: Secondary | ICD-10-CM | POA: Diagnosis not present

## 2023-01-20 DIAGNOSIS — E440 Moderate protein-calorie malnutrition: Secondary | ICD-10-CM | POA: Diagnosis not present

## 2023-01-20 DIAGNOSIS — I5032 Chronic diastolic (congestive) heart failure: Secondary | ICD-10-CM | POA: Diagnosis not present

## 2023-01-20 DIAGNOSIS — I700 Atherosclerosis of aorta: Secondary | ICD-10-CM | POA: Diagnosis not present

## 2023-01-20 DIAGNOSIS — I480 Paroxysmal atrial fibrillation: Secondary | ICD-10-CM | POA: Diagnosis not present

## 2023-01-20 DIAGNOSIS — E1322 Other specified diabetes mellitus with diabetic chronic kidney disease: Secondary | ICD-10-CM | POA: Diagnosis not present

## 2023-01-20 DIAGNOSIS — N186 End stage renal disease: Secondary | ICD-10-CM | POA: Diagnosis not present

## 2023-01-20 DIAGNOSIS — N179 Acute kidney failure, unspecified: Secondary | ICD-10-CM | POA: Diagnosis not present

## 2023-01-20 DIAGNOSIS — L299 Pruritus, unspecified: Secondary | ICD-10-CM | POA: Diagnosis not present

## 2023-01-20 DIAGNOSIS — Z992 Dependence on renal dialysis: Secondary | ICD-10-CM | POA: Diagnosis not present

## 2023-01-20 DIAGNOSIS — I5021 Acute systolic (congestive) heart failure: Secondary | ICD-10-CM | POA: Diagnosis not present

## 2023-01-20 DIAGNOSIS — I132 Hypertensive heart and chronic kidney disease with heart failure and with stage 5 chronic kidney disease, or end stage renal disease: Secondary | ICD-10-CM | POA: Diagnosis not present

## 2023-01-20 DIAGNOSIS — N2581 Secondary hyperparathyroidism of renal origin: Secondary | ICD-10-CM | POA: Diagnosis not present

## 2023-01-21 ENCOUNTER — Encounter: Payer: Self-pay | Admitting: Family Medicine

## 2023-01-21 DIAGNOSIS — I480 Paroxysmal atrial fibrillation: Secondary | ICD-10-CM | POA: Diagnosis not present

## 2023-01-21 DIAGNOSIS — I5032 Chronic diastolic (congestive) heart failure: Secondary | ICD-10-CM | POA: Diagnosis not present

## 2023-01-21 DIAGNOSIS — E440 Moderate protein-calorie malnutrition: Secondary | ICD-10-CM | POA: Diagnosis not present

## 2023-01-21 DIAGNOSIS — I5021 Acute systolic (congestive) heart failure: Secondary | ICD-10-CM | POA: Diagnosis not present

## 2023-01-21 DIAGNOSIS — I700 Atherosclerosis of aorta: Secondary | ICD-10-CM | POA: Diagnosis not present

## 2023-01-21 DIAGNOSIS — I132 Hypertensive heart and chronic kidney disease with heart failure and with stage 5 chronic kidney disease, or end stage renal disease: Secondary | ICD-10-CM | POA: Diagnosis not present

## 2023-01-21 DIAGNOSIS — E1322 Other specified diabetes mellitus with diabetic chronic kidney disease: Secondary | ICD-10-CM | POA: Diagnosis not present

## 2023-01-21 DIAGNOSIS — N186 End stage renal disease: Secondary | ICD-10-CM | POA: Diagnosis not present

## 2023-01-21 DIAGNOSIS — N179 Acute kidney failure, unspecified: Secondary | ICD-10-CM | POA: Diagnosis not present

## 2023-01-22 DIAGNOSIS — I700 Atherosclerosis of aorta: Secondary | ICD-10-CM | POA: Diagnosis not present

## 2023-01-22 DIAGNOSIS — E440 Moderate protein-calorie malnutrition: Secondary | ICD-10-CM | POA: Diagnosis not present

## 2023-01-22 DIAGNOSIS — Z992 Dependence on renal dialysis: Secondary | ICD-10-CM | POA: Diagnosis not present

## 2023-01-22 DIAGNOSIS — I480 Paroxysmal atrial fibrillation: Secondary | ICD-10-CM | POA: Diagnosis not present

## 2023-01-22 DIAGNOSIS — N179 Acute kidney failure, unspecified: Secondary | ICD-10-CM | POA: Diagnosis not present

## 2023-01-22 DIAGNOSIS — I132 Hypertensive heart and chronic kidney disease with heart failure and with stage 5 chronic kidney disease, or end stage renal disease: Secondary | ICD-10-CM | POA: Diagnosis not present

## 2023-01-22 DIAGNOSIS — E1322 Other specified diabetes mellitus with diabetic chronic kidney disease: Secondary | ICD-10-CM | POA: Diagnosis not present

## 2023-01-22 DIAGNOSIS — L299 Pruritus, unspecified: Secondary | ICD-10-CM | POA: Diagnosis not present

## 2023-01-22 DIAGNOSIS — N186 End stage renal disease: Secondary | ICD-10-CM | POA: Diagnosis not present

## 2023-01-22 DIAGNOSIS — I5032 Chronic diastolic (congestive) heart failure: Secondary | ICD-10-CM | POA: Diagnosis not present

## 2023-01-22 DIAGNOSIS — N2581 Secondary hyperparathyroidism of renal origin: Secondary | ICD-10-CM | POA: Diagnosis not present

## 2023-01-22 DIAGNOSIS — I5021 Acute systolic (congestive) heart failure: Secondary | ICD-10-CM | POA: Diagnosis not present

## 2023-01-24 DIAGNOSIS — L299 Pruritus, unspecified: Secondary | ICD-10-CM | POA: Diagnosis not present

## 2023-01-24 DIAGNOSIS — N186 End stage renal disease: Secondary | ICD-10-CM | POA: Diagnosis not present

## 2023-01-24 DIAGNOSIS — N2581 Secondary hyperparathyroidism of renal origin: Secondary | ICD-10-CM | POA: Diagnosis not present

## 2023-01-24 DIAGNOSIS — Z992 Dependence on renal dialysis: Secondary | ICD-10-CM | POA: Diagnosis not present

## 2023-01-27 DIAGNOSIS — L299 Pruritus, unspecified: Secondary | ICD-10-CM | POA: Diagnosis not present

## 2023-01-27 DIAGNOSIS — Z992 Dependence on renal dialysis: Secondary | ICD-10-CM | POA: Diagnosis not present

## 2023-01-27 DIAGNOSIS — N2581 Secondary hyperparathyroidism of renal origin: Secondary | ICD-10-CM | POA: Diagnosis not present

## 2023-01-27 DIAGNOSIS — N186 End stage renal disease: Secondary | ICD-10-CM | POA: Diagnosis not present

## 2023-01-28 DIAGNOSIS — N186 End stage renal disease: Secondary | ICD-10-CM | POA: Diagnosis not present

## 2023-01-28 DIAGNOSIS — I132 Hypertensive heart and chronic kidney disease with heart failure and with stage 5 chronic kidney disease, or end stage renal disease: Secondary | ICD-10-CM | POA: Diagnosis not present

## 2023-01-28 DIAGNOSIS — N179 Acute kidney failure, unspecified: Secondary | ICD-10-CM | POA: Diagnosis not present

## 2023-01-28 DIAGNOSIS — E1322 Other specified diabetes mellitus with diabetic chronic kidney disease: Secondary | ICD-10-CM | POA: Diagnosis not present

## 2023-01-28 DIAGNOSIS — I480 Paroxysmal atrial fibrillation: Secondary | ICD-10-CM | POA: Diagnosis not present

## 2023-01-28 DIAGNOSIS — I700 Atherosclerosis of aorta: Secondary | ICD-10-CM | POA: Diagnosis not present

## 2023-01-28 DIAGNOSIS — E440 Moderate protein-calorie malnutrition: Secondary | ICD-10-CM | POA: Diagnosis not present

## 2023-01-28 DIAGNOSIS — I5032 Chronic diastolic (congestive) heart failure: Secondary | ICD-10-CM | POA: Diagnosis not present

## 2023-01-28 DIAGNOSIS — I5021 Acute systolic (congestive) heart failure: Secondary | ICD-10-CM | POA: Diagnosis not present

## 2023-01-29 ENCOUNTER — Ambulatory Visit: Payer: Medicare PPO | Admitting: Physical Medicine & Rehabilitation

## 2023-01-29 DIAGNOSIS — N2581 Secondary hyperparathyroidism of renal origin: Secondary | ICD-10-CM | POA: Diagnosis not present

## 2023-01-29 DIAGNOSIS — L299 Pruritus, unspecified: Secondary | ICD-10-CM | POA: Diagnosis not present

## 2023-01-29 DIAGNOSIS — Z992 Dependence on renal dialysis: Secondary | ICD-10-CM | POA: Diagnosis not present

## 2023-01-29 DIAGNOSIS — N186 End stage renal disease: Secondary | ICD-10-CM | POA: Diagnosis not present

## 2023-01-30 ENCOUNTER — Ambulatory Visit (HOSPITAL_COMMUNITY): Payer: Medicare PPO | Attending: Nurse Practitioner

## 2023-01-30 DIAGNOSIS — I5042 Chronic combined systolic (congestive) and diastolic (congestive) heart failure: Secondary | ICD-10-CM | POA: Diagnosis not present

## 2023-01-30 LAB — ECHOCARDIOGRAM COMPLETE
Area-P 1/2: 5.9 cm2
MV M vel: 5.63 m/s
MV Peak grad: 126.8 mm[Hg]
S' Lateral: 2.8 cm

## 2023-01-31 DIAGNOSIS — N186 End stage renal disease: Secondary | ICD-10-CM | POA: Diagnosis not present

## 2023-01-31 DIAGNOSIS — L299 Pruritus, unspecified: Secondary | ICD-10-CM | POA: Diagnosis not present

## 2023-01-31 DIAGNOSIS — Z992 Dependence on renal dialysis: Secondary | ICD-10-CM | POA: Diagnosis not present

## 2023-01-31 DIAGNOSIS — N2581 Secondary hyperparathyroidism of renal origin: Secondary | ICD-10-CM | POA: Diagnosis not present

## 2023-02-03 DIAGNOSIS — N186 End stage renal disease: Secondary | ICD-10-CM | POA: Diagnosis not present

## 2023-02-03 DIAGNOSIS — Z992 Dependence on renal dialysis: Secondary | ICD-10-CM | POA: Diagnosis not present

## 2023-02-03 DIAGNOSIS — L299 Pruritus, unspecified: Secondary | ICD-10-CM | POA: Diagnosis not present

## 2023-02-03 DIAGNOSIS — N2581 Secondary hyperparathyroidism of renal origin: Secondary | ICD-10-CM | POA: Diagnosis not present

## 2023-02-04 ENCOUNTER — Ambulatory Visit: Payer: Medicare PPO | Admitting: General Practice

## 2023-02-05 DIAGNOSIS — N2581 Secondary hyperparathyroidism of renal origin: Secondary | ICD-10-CM | POA: Diagnosis not present

## 2023-02-05 DIAGNOSIS — L299 Pruritus, unspecified: Secondary | ICD-10-CM | POA: Diagnosis not present

## 2023-02-05 DIAGNOSIS — N186 End stage renal disease: Secondary | ICD-10-CM | POA: Diagnosis not present

## 2023-02-05 DIAGNOSIS — Z992 Dependence on renal dialysis: Secondary | ICD-10-CM | POA: Diagnosis not present

## 2023-02-06 ENCOUNTER — Other Ambulatory Visit: Payer: Self-pay | Admitting: *Deleted

## 2023-02-06 ENCOUNTER — Other Ambulatory Visit (HOSPITAL_COMMUNITY): Payer: Self-pay

## 2023-02-06 DIAGNOSIS — I132 Hypertensive heart and chronic kidney disease with heart failure and with stage 5 chronic kidney disease, or end stage renal disease: Secondary | ICD-10-CM | POA: Diagnosis not present

## 2023-02-06 DIAGNOSIS — E440 Moderate protein-calorie malnutrition: Secondary | ICD-10-CM | POA: Diagnosis not present

## 2023-02-06 DIAGNOSIS — I5032 Chronic diastolic (congestive) heart failure: Secondary | ICD-10-CM | POA: Diagnosis not present

## 2023-02-06 DIAGNOSIS — E1322 Other specified diabetes mellitus with diabetic chronic kidney disease: Secondary | ICD-10-CM | POA: Diagnosis not present

## 2023-02-06 DIAGNOSIS — N179 Acute kidney failure, unspecified: Secondary | ICD-10-CM | POA: Diagnosis not present

## 2023-02-06 DIAGNOSIS — I700 Atherosclerosis of aorta: Secondary | ICD-10-CM | POA: Diagnosis not present

## 2023-02-06 DIAGNOSIS — I5021 Acute systolic (congestive) heart failure: Secondary | ICD-10-CM | POA: Diagnosis not present

## 2023-02-06 DIAGNOSIS — I480 Paroxysmal atrial fibrillation: Secondary | ICD-10-CM | POA: Diagnosis not present

## 2023-02-06 DIAGNOSIS — N186 End stage renal disease: Secondary | ICD-10-CM | POA: Diagnosis not present

## 2023-02-06 NOTE — Patient Instructions (Signed)
Visit Information  Thank you for taking time to visit with me today. Please don't hesitate to contact me if I can be of assistance to you before our next scheduled telephone appointment.  Following are the goals we discussed today:   Goals Addressed             This Visit's Progress    RNCM Care Management Expected Outcomes: Monitor, Self-Manage and Reduce Symptoms: ESRD, DM, AFIB       Current Barriers:  Knowledge Deficits related to plan of care for management of Atrial Fibrillation, DMII, and ESRD  Chronic Disease Management support and education needs related to Atrial Fibrillation, DMII, and ESRD  Patient seen in PCP office today for hospital follow up and patient was concerned with reoccurrence of cellulitis, MD provided patient with a 5 day course of Keflex. Home health ([facility]) scheduled to see patient tomorrow for wound care needs with PT/OT to follow. RNCM advised to keep extremities elevated when sitting and to keep area clean at all times.  RNCM Clinical Goal(s):  Patient will verbalize basic understanding of  Atrial Fibrillation, DMII, and ESRD disease process and self health management plan as evidenced by verbal explanation, recognizing symptoms, lifestyle modifications and daily monitoring take all medications exactly as prescribed and will call provider for medication related questions as evidenced by compliance with all medications and treatment plan attend all scheduled medical appointments: with primary care provider and specialists as evidenced by keeping all scheduled appointments demonstrate Improved and Ongoing adherence to prescribed treatment plan for Atrial Fibrillation, DMII, and ESRD as evidenced by consistent medication compliance, symptom monitoring, continued lifestyle modifications, and self-management not experience hospital admission as evidenced by review of EMR. Hospital Admissions in last 6 months = 4  through collaboration with RN Care manager,  provider, and care team.   Interventions: Evaluation of current treatment plan related to  self management and patient's adherence to plan as established by provider   AFIB Interventions: (Status:  New goal. and Goal on track:  Yes.) Long Term Goal Counseled on increased risk of stroke due to Afib and benefits of anticoagulation for stroke prevention Reviewed importance of adherence to anticoagulant exactly as prescribed. Patient currently only taking aspirin and using Metoprolol for rate/rhythm control. Per son, they are checking her BP Q8H due to medication dosing. Counseled on bleeding risk associated with taking aspirin and importance of self-monitoring for signs/symptoms of bleeding Counseled on avoidance of NSAIDs due to increased bleeding risk with anticoagulants Counseled on importance of regular laboratory monitoring as prescribed    End Stage Renal Disease Interventions:  (Status:  New goal. and Goal on track:  Yes.) Long Term Goal Evaluation of current treatment plan related to chronic kidney disease self management and patient's adherence to plan as established by provider.  ESRD on HD MWF. Patient states she received HD today and her BP bottomed out and she was placed in trendelenburg and UF was turned off to complete treatment. She states post treatment she is feeling terrible, weak and having vertigo. Per patient son, the last few treatments her being able to safely tolerate HD has decreased. [facility] has been consulted and will visit with patient on next Wednesday.   Provided education to patient re: stroke prevention, s/s of heart attack and stroke    Reviewed prescribed diet Renal Diet/Diabetic Diet Reviewed medications with patient and discussed importance of compliance    Counseled on adverse effects of illicit drug and excessive alcohol use in patients with chronic  kidney disease    Advised patient, providing education and rationale, to monitor blood pressure daily and  record, calling PCP for findings outside established parameters    Discussed complications of poorly controlled blood pressure such as heart disease, stroke, circulatory complications, vision complications, kidney impairment, sexual dysfunction    Reviewed scheduled/upcoming provider appointments including    Discussed plans with patient for ongoing care management follow up and provided patient with direct contact information for care management team    Last practice recorded BP readings:  BP Readings from Last 3 Encounters:  01/02/23 (!) 156/70  12/16/22 132/60  11/29/22 (!) 139/49   Most recent eGFR/CrCl: No results found for: "EGFR"  No components found for: "CRCL"    Diabetes Interventions:  (Status:  New goal. and Goal on track:  Yes.) Long Term Goal Assessed patient's understanding of A1c goal: <6.5% Provided education to patient about basic DM disease process. Patient currently uses Libre 2 and states the lowest blood sugar recently was in the 80s and the highest around 130-180s. Fasting glucose this morning was 135. Patient/son express difficulties maintain blood sugar throughout the night and state that peanut butter crackers are often times not sufficient. RNCM advised have a bed time snack that consistent of protein will help stabilize patient blood sugar. Reviewed medications with patient and discussed importance of medication adherence Counseled on importance of regular laboratory monitoring as prescribed Discussed plans with patient for ongoing care management follow up and provided patient with direct contact information for care management team Provided patient with written educational materials related to hypo and hyperglycemia and importance of correct treatment Review of patient status, including review of consultants reports, relevant laboratory and other test results, and medications completed Lab Results  Component Value Date   HGBA1C 6.1 (H) 10/27/2022    Patient  Goals/Self-Care Activities: Take all medications as prescribed Attend all scheduled provider appointments Call pharmacy for medication refills 3-7 days in advance of running out of medications Call provider office for new concerns or questions  take the blood sugar log to all doctor visits keep feet up while sitting wash and dry feet carefully every day keep all lab appointments check blood pressure daily keep a blood pressure log  Follow Up Plan:  Telephone follow up appointment with care management team member scheduled for:  03-20-2023 at 1:45 pm           Our next appointment is by telephone on 03-20-2023 at 1:45 pm  Please call the care guide team at [phone] if you need to cancel or reschedule your appointment.   If you are experiencing a Mental Health or Behavioral Health Crisis or need someone to talk to, please call the Suicide and Crisis Lifeline: 988 call the [place] National Suicide Prevention Lifeline: 213-771-3490 or TTY: 4697184579 TTY [phone]) to talk to a trained counselor call 1-800-273-TALK (toll free, 24 hour hotline) go to [facility] [address] [place] [phone])   Patient verbalizes understanding of instructions and care plan provided today and agrees to view in MyChart. Active MyChart status and patient understanding of how to access instructions and care plan via MyChart confirmed with patient.     Danise Edge, BSN RN RN Care Manager  [place]  Ambulatory Care Management  Direct Number: [phone]

## 2023-02-06 NOTE — Patient Outreach (Signed)
Care Management   Visit Note  02/06/2023 Name: Kim Burns MRN: [id number] DOB: [birth year]  Subjective: Kim Burns is a 87 y.o. year old female who is a primary care patient of Kim Moccasin, MD. The Care Management team was consulted for assistance.      Engaged with patient spoke with patient by telephone.    Goals Addressed             This Visit's Progress    RNCM Care Management Expected Outcomes: Monitor, Self-Manage and Reduce Symptoms: ESRD, DM, AFIB       Current Barriers:  Knowledge Deficits related to plan of care for management of Atrial Fibrillation, DMII, and ESRD  Chronic Disease Management support and education needs related to Atrial Fibrillation, DMII, and ESRD  Patient seen in PCP office today for hospital follow up and patient was concerned with reoccurrence of cellulitis, MD provided patient with a 5 day course of Keflex. Home health ([facility]) scheduled to see patient tomorrow for wound care needs with PT/OT to follow. RNCM advised to keep extremities elevated when sitting and to keep area clean at all times.  RNCM Clinical Goal(s):  Patient will verbalize basic understanding of  Atrial Fibrillation, DMII, and ESRD disease process and self health management plan as evidenced by verbal explanation, recognizing symptoms, lifestyle modifications and daily monitoring take all medications exactly as prescribed and will call provider for medication related questions as evidenced by compliance with all medications and treatment plan attend all scheduled medical appointments: with primary care provider and specialists as evidenced by keeping all scheduled appointments demonstrate Improved and Ongoing adherence to prescribed treatment plan for Atrial Fibrillation, DMII, and ESRD as evidenced by consistent medication compliance, symptom monitoring, continued lifestyle modifications, and self-management not experience hospital admission as evidenced by  review of EMR. Hospital Admissions in last 6 months = 4  through collaboration with RN Care manager, provider, and care team.   Interventions: Evaluation of current treatment plan related to  self management and patient's adherence to plan as established by provider   AFIB Interventions: (Status:  New goal. and Goal on track:  Yes.) Long Term Goal Counseled on increased risk of stroke due to Afib and benefits of anticoagulation for stroke prevention Reviewed importance of adherence to anticoagulant exactly as prescribed. Patient currently only taking aspirin and using Metoprolol for rate/rhythm control. Per son, they are checking her BP Q8H due to medication dosing. Counseled on bleeding risk associated with taking aspirin and importance of self-monitoring for signs/symptoms of bleeding Counseled on avoidance of NSAIDs due to increased bleeding risk with anticoagulants Counseled on importance of regular laboratory monitoring as prescribed    End Stage Renal Disease Interventions:  (Status:  New goal. and Goal on track:  Yes.) Long Term Goal Evaluation of current treatment plan related to chronic kidney disease self management and patient's adherence to plan as established by provider.  ESRD on HD MWF. Patient states she received HD today and her BP bottomed out and she was placed in trendelenburg and UF was turned off to complete treatment. She states post treatment she is feeling terrible, weak and having vertigo. Per patient son, the last few treatments her being able to safely tolerate HD has decreased. [facility] has been consulted and will visit with patient on next Wednesday.   Provided education to patient re: stroke prevention, s/s of heart attack and stroke    Reviewed prescribed diet Renal Diet/Diabetic Diet Reviewed medications with patient and discussed importance  of compliance    Counseled on adverse effects of illicit drug and excessive alcohol use in patients with chronic kidney  disease    Advised patient, providing education and rationale, to monitor blood pressure daily and record, calling PCP for findings outside established parameters    Discussed complications of poorly controlled blood pressure such as heart disease, stroke, circulatory complications, vision complications, kidney impairment, sexual dysfunction    Reviewed scheduled/upcoming provider appointments including    Discussed plans with patient for ongoing care management follow up and provided patient with direct contact information for care management team    Last practice recorded BP readings:  BP Readings from Last 3 Encounters:  01/02/23 (!) 156/70  12/16/22 132/60  11/29/22 (!) 139/49   Most recent eGFR/CrCl: No results found for: "EGFR"  No components found for: "CRCL"    Diabetes Interventions:  (Status:  New goal. and Goal on track:  Yes.) Long Term Goal Assessed patient's understanding of A1c goal: <6.5% Provided education to patient about basic DM disease process. Patient currently uses Libre 2 and states the lowest blood sugar recently was in the 80s and the highest around 130-180s. Fasting glucose this morning was 135. Patient/son express difficulties maintain blood sugar throughout the night and state that peanut butter crackers are often times not sufficient. RNCM advised have a bed time snack that consistent of protein will help stabilize patient blood sugar. Reviewed medications with patient and discussed importance of medication adherence Counseled on importance of regular laboratory monitoring as prescribed Discussed plans with patient for ongoing care management follow up and provided patient with direct contact information for care management team Provided patient with written educational materials related to hypo and hyperglycemia and importance of correct treatment Review of patient status, including review of consultants reports, relevant laboratory and other test results, and  medications completed Lab Results  Component Value Date   HGBA1C 6.1 (H) 10/27/2022    Patient Goals/Self-Care Activities: Take all medications as prescribed Attend all scheduled provider appointments Call pharmacy for medication refills 3-7 days in advance of running out of medications Call provider office for new concerns or questions  take the blood sugar log to all doctor visits keep feet up while sitting wash and dry feet carefully every day keep all lab appointments check blood pressure daily keep a blood pressure log  Follow Up Plan:  Telephone follow up appointment with care management team member scheduled for:  03-20-2023 at 1:45 pm              Consent to Services:  Patient was given information about care management services, agreed to services, and gave verbal consent to participate.   Plan: Telephone follow up appointment with care management team member scheduled for:03-20-2023 at 1:45 pm  Danise Edge, BSN RN RN Care Manager  [facility]  Ambulatory Care Management  Direct Number: [phone]

## 2023-02-07 ENCOUNTER — Telehealth: Payer: Self-pay

## 2023-02-07 DIAGNOSIS — N186 End stage renal disease: Secondary | ICD-10-CM | POA: Diagnosis not present

## 2023-02-07 DIAGNOSIS — N2581 Secondary hyperparathyroidism of renal origin: Secondary | ICD-10-CM | POA: Diagnosis not present

## 2023-02-07 DIAGNOSIS — Z992 Dependence on renal dialysis: Secondary | ICD-10-CM | POA: Diagnosis not present

## 2023-02-07 DIAGNOSIS — L299 Pruritus, unspecified: Secondary | ICD-10-CM | POA: Diagnosis not present

## 2023-02-07 NOTE — Telephone Encounter (Signed)
Lmom to discuss echo results. Waiting on a return call. 

## 2023-02-08 DIAGNOSIS — I5021 Acute systolic (congestive) heart failure: Secondary | ICD-10-CM | POA: Diagnosis not present

## 2023-02-08 DIAGNOSIS — N186 End stage renal disease: Secondary | ICD-10-CM | POA: Diagnosis not present

## 2023-02-08 DIAGNOSIS — I480 Paroxysmal atrial fibrillation: Secondary | ICD-10-CM | POA: Diagnosis not present

## 2023-02-08 DIAGNOSIS — I132 Hypertensive heart and chronic kidney disease with heart failure and with stage 5 chronic kidney disease, or end stage renal disease: Secondary | ICD-10-CM | POA: Diagnosis not present

## 2023-02-08 DIAGNOSIS — E440 Moderate protein-calorie malnutrition: Secondary | ICD-10-CM | POA: Diagnosis not present

## 2023-02-08 DIAGNOSIS — I700 Atherosclerosis of aorta: Secondary | ICD-10-CM | POA: Diagnosis not present

## 2023-02-08 DIAGNOSIS — N179 Acute kidney failure, unspecified: Secondary | ICD-10-CM | POA: Diagnosis not present

## 2023-02-08 DIAGNOSIS — I5032 Chronic diastolic (congestive) heart failure: Secondary | ICD-10-CM | POA: Diagnosis not present

## 2023-02-08 DIAGNOSIS — E1322 Other specified diabetes mellitus with diabetic chronic kidney disease: Secondary | ICD-10-CM | POA: Diagnosis not present

## 2023-02-08 NOTE — Progress Notes (Unsigned)
Cardiology Office Note   Date:  02/13/2023   ID:  Kim Burns, Kim Burns 02-07-1936, MRN [id number]  PCP:  Lewis Moccasin, MD  Cardiologist:   Merrill Deanda Swaziland, MD   No chief complaint on file.     History of Present Illness: Kim Burns is a 87 y.o. female who is seen for follow up Afib and orthostatic hypotension. She has a history of ESRD and HTN. Echo in 2015 showed LVH with normal systolic function. She had a Whipple procedure in 2014 with pancreatectomy and partial bowel resection. She developed ESRD felt to be related to DM and HTN. She has been on hemodialysis.  With dialysis she has developed orthostatic hypotension that is worse on dialysis days. Her antihypertensives were discontinued and she was placed on  midodrine on dialysis days.  She developed  new onset atrial fibrillation with RVR during hospitalization in early July 2023 after an ablation procedure for left renal cell carcinoma. She was started on rate control agents as well as Eliquis for stroke prophylaxis.  She had spontaneously converted back to sinus rhythm prior to discharge after receiving IV Cardizem. CTA of abdomen and chest did not show any evidence of PE, 3 mm left pulmonary nodule noted, stable solid mass in the medial aspect of the left kidney.  Diagnostic cardiac catheterization on 7/10 due to elevated troponins on 7/8 revealed nonobstructive disease.  Repeat echo obtained on the same day revealed LVEF of 60 to 65%, moderately reduced RV systolic function, grade 3 diastolic dysfunction, RSVP 46.1 mmHg, severe biatrial enlargement, mild MR, mild to moderate TR.    She was admitted 9/11-9/17/23 with acute sepsis. CT abdomen and pelvis in ED showed interval development of post ablation changes to left kidney with additional interval development of severe hydronephrosis with obstruction of the proximal ureter.  She also had lactic acidosis and leukocytosis.  Cultures obtained.  She was started on vancomycin  and Zosyn. Blood culture with Streptococcus gallolyticus in 1 out of 2 bottles.  Patient had left ureteral stent placement by urology on 9/13, and drain placed for left perinephric abscess by IR on 9/15.   She was admitted on Dec 2023 with severe flank pain. Hgb down to 7.5. She has a history of renal cell mass status post nephrostomy tube, ureteral stent. CT renal study showed large left renal subcapsular hematoma extending into the perirenal space left pigtail ureteral stent in place, no hydronephrosis. Eliquis was held. Urology consulted and observation recommended.   She presented to the ED on 09/13/2022 with generalized weakness and fatigue.  She was found to be in atrial fibrillation with RVR, HR 150-170s.  She was started on diltiazem drip.  Cardiology was consulted.  Again the decision was made to defer anticoagulation as the risk of bleeding outweighed the risk of stroke.  Repeat echocardiogram showed EF 55-60%, no RWMA, normal RV size, moderately reduced RV function, severely elevated PA systolic pressure, severe LA enlargement, mild MR and mild TR.  She was also noted to have acute metabolic encephalopathy/myoclonus thought to be secondary to inappropriate Valtrex dosing in the setting of zoster ophthalmicus.  Her mental status improved with renal dosing. She was last seen in the office on 10/01/2022 and noted intermittent elevated heart rate, persistent weakness, dizziness, generalized fatigue, bilateral lower extremity edema.  Amiodarone was increased to 200 mg daily.  Once again rate control strategy was recommended given no anticoagulation.  She was hospitalized in September 2024 in the setting of fall  that resulted in contrecoup brain injury with nondisplaced left posterior skull fracture, right anterior frontal lobe hemorrhagic contusion, nondisplaced sacral fracture at S4.  MRI brain showed stable contusion, small acute to early subacute ischemic, hemorrhagic infarct.  Neurology was consulted.   It was felt that her stroke was likely cardioembolic in the setting of atrial fibrillation with RVR not on anticoagulation.  Cardiology was consulted.  She was reloaded with amiodarone and started on low-dose metoprolol.  Repeat echocardiogram showed EF 45% to 50%, mild RV dysfunction, severe left atrial enlargement, mild right atrial enlargement, mild to moderate mitral valve regurgitation. She was seen in our office on 10/28 and doing OK.   She was admitted 11/4-11/14 with LLE cellulitis, hypotension, severe deconditioning, intermittent delirium and anemia requiring transfusion. Had to have nephrostomy tube replaced. Palliative care consulted. Despite significant decline in her overall condition she continued to want full treatment and did not wish Hospice.   She was seen in ED on 11/23 when she developed some bruising on her torso after being lifted to chair by family member. Hgb 8.5. CXR ok.   On follow up today she notes bleeding from her access site. Planning to have AV fistula looked at. Still gets hypotensive during dialysis and then hypertensive on nondialysis days. Uses hydralazine prn for this. Recent Echo no change. Chronic LE edema.     Past Medical History:  Diagnosis Date   Anemia of chronic disease    takes iron   Anxiety    Blood transfusion without reported diagnosis    Bradycardia    Diabetes mellitus without complication (HCC)    became diabetic after Whipple procedure   Diarrhea    Dysrhythmia 04/16/2016   bradycardia due to medication    ESRD on hemodialysis [facility])    M-W-F   GERD (gastroesophageal reflux disease)    Gout    Headache    History of kidney stones 06/2013   Hyperparathyroidism (HCC)    Hypertension    PONV (postoperative nausea and vomiting)    Sleep apnea    no cpap machine. could not tolerate   Stroke (HCC) 04/16/2016   TIA 1995   Vitamin D deficiency     Past Surgical History:  Procedure Laterality Date   A/V FISTULAGRAM Left 08/30/2021    Procedure: A/V Fistulagram;  Surgeon: Cephus Shelling, MD;  Location: [facility] INVASIVE CV LAB;  Service: Cardiovascular;  Laterality: Left;   ABDOMINAL HYSTERECTOMY  1985   complete   BACK SURGERY  1980   lower   BASCILIC VEIN TRANSPOSITION Left 04/24/2017   Procedure: LEFT ARM FIRST STAGE BASILIC VEIN TRANSPOSITION;  Surgeon: Nada Libman, MD;  Location: [facility] OR;  Service: Vascular;  Laterality: Left;   BASCILIC VEIN TRANSPOSITION Left 07/10/2017   Procedure: SECOND STAGE BASILIC VEIN TRANSPOSITION LEFT ARM;  Surgeon: Nada Libman, MD;  Location: [facility] OR;  Service: Vascular;  Laterality: Left;   BREAST LUMPECTOMY Left x 2   many years apart, benign   CHOLECYSTECTOMY  1985   CYSTOSCOPY W/ URETERAL STENT PLACEMENT Left 10/31/2021   Procedure: CYSTOSCOPY WITH RETROGRADE PYELOGRAM/URETERAL STENT PLACEMENT;  Surgeon: Crist Fat, MD;  Location: [facility] OR;  Service: Urology;  Laterality: Left;   CYSTOSCOPY WITH URETEROSCOPY AND STENT PLACEMENT Left 06/18/2013   Procedure: CYSTOSCOPY WITH Lef URETEROSCOPY AND Left STENT PLACEMENT;  Surgeon: Crecencio Mc, MD;  Location: [facility] ORS;  Service: Urology;  Laterality: Left;   ESOPHAGOGASTRODUODENOSCOPY (EGD) WITH PROPOFOL N/A 11/13/2016   Procedure: ESOPHAGOGASTRODUODENOSCOPY (EGD)  WITH PROPOFOL;  Surgeon: Kathi Der, MD;  Location: [facility] ENDOSCOPY;  Service: Gastroenterology;  Laterality: N/A;   EUS N/A 02/07/2016   Procedure: ESOPHAGEAL ENDOSCOPIC ULTRASOUND (EUS) RADIAL;  Surgeon: Willis Modena, MD;  Location: [facility] END[facility];  Service: Endoscopy;  Laterality: N/A;   EYE SURGERY Bilateral 2014   ioc for catracts    FOOT SURGERY Left 1990   something with toes unsure what    HERNIA REPAIR  2018   IR CATHETER TUBE CHANGE  03/14/2022   IR EMBO TUMOR ORGAN ISCHEMIA INFARCT INC GUIDE ROADMAPPING  08/22/2021   IR NEPHROSTOMY EXCHANGE LEFT  06/27/2022   IR NEPHROSTOMY EXCHANGE LEFT  08/26/2022   IR NEPHROSTOMY EXCHANGE LEFT  10/22/2022   IR NEPHROSTOMY  EXCHANGE LEFT  12/16/2022   IR NEPHROSTOMY EXCHANGE LEFT  12/30/2022   IR NEPHROSTOMY EXCHANGE LEFT  02/10/2023   IR NEPHROSTOMY PLACEMENT LEFT  03/14/2022   IR RADIOLOGIST EVAL & MGMT  05/18/2021   IR RADIOLOGIST EVAL & MGMT  10/01/2021   IR RADIOLOGIST EVAL & MGMT  11/12/2021   IR RADIOLOGIST EVAL & MGMT  11/22/2021   IR RADIOLOGIST EVAL & MGMT  11/27/2021   IR RADIOLOGIST EVAL & MGMT  12/20/2021   IR RENAL SUPRASEL UNI S&I MOD SED  08/22/2021   IR US GUIDE VASC ACCESS RIGHT  08/22/2021   LEFT HEART CATH AND CORONARY ANGIOGRAPHY N/A 08/27/2021   Procedure: LEFT HEART CATH AND CORONARY ANGIOGRAPHY;  Surgeon: Kathleene Hazel, MD;  Location: [facility] INVASIVE CV LAB;  Service: Cardiovascular;  Laterality: N/A;   PARATHYROID EXPLORATION     PERIPHERAL VASCULAR BALLOON ANGIOPLASTY Left 08/30/2021   Procedure: PERIPHERAL VASCULAR BALLOON ANGIOPLASTY;  Surgeon: Cephus Shelling, MD;  Location: [facility] INVASIVE CV LAB;  Service: Cardiovascular;  Laterality: Left;  arm fistula   RADIOLOGY WITH ANESTHESIA Left 08/22/2021   Procedure: MICROWAVE ABLATION;  Surgeon: Simonne Come, MD;  Location: [facility] ORS;  Service: Anesthesiology;  Laterality: Left;   WHIPPLE PROCEDURE N/A 04/23/2016   Procedure: WHIPPLE PROCEDURE;  Surgeon: Almond Lint, MD;  Location: [facility] OR;  Service: General;  Laterality: N/A;     Current Outpatient Medications  Medication Sig Dispense Refill   acetaminophen (TYLENOL) 325 MG tablet Take 2 tablets (650 mg total) by mouth every 6 (six) hours as needed for mild pain (pain score 1-3), moderate pain (pain score 4-6), fever or headache.     amantadine (SYMMETREL) 100 MG capsule Take 1 capsule (100 mg total) by mouth once a week. 30 capsule 0   amiodarone (PACERONE) 200 MG tablet Take 1 tablet (200 mg total) by mouth daily. 30 tablet 0   aspirin 81 MG chewable tablet Chew 1 tablet (81 mg total) by mouth daily. (Patient taking differently: Chew 81 mg by mouth daily. Take 81mg  by mouth every morning  except on dialysis days, hold in the morning and take after dialysis.) 90 tablet 0   B Complex-C-Zn-Folic Acid (DIALYVITE 800 WITH ZINC) 0.8 MG TABS Take 1 tablet by mouth daily.     cycloSPORINE (RESTASIS) 0.05 % ophthalmic emulsion Place 1 drop into both eyes daily as needed (irritation). (Patient taking differently: Place 1 drop into both eyes daily as needed (dryness).)     diphenhydramine-acetaminophen (TYLENOL PM) 25-500 MG TABS tablet Take 2 tablets by mouth at bedtime.     insulin aspart (NOVOLOG FLEXPEN) 100 UNIT/ML FlexPen Inject 5-7 Units into the skin See admin instructions. Use 5 units if blood sugar over 200. Use 7 units if blood  sugar over 250.     lidocaine-prilocaine (EMLA) cream Apply 1 Application topically every Monday, Wednesday, and Friday with hemodialysis.     lipase/protease/amylase (CREON) 36000 UNITS CPEP capsule Take 36,000 Units by mouth See admin instructions. Take 36,000 units (1 capsule) by mouth with meals and snacks.     meclizine (ANTIVERT) 25 MG tablet Take 1 tablet (25 mg total) by mouth 3 (three) times daily as needed for dizziness. 60 tablet 2   metoprolol succinate (TOPROL-XL) 25 MG 24 hr tablet Take 1 tablet (25 mg total) by mouth 3 (three) times a week. Take on nondialysis days, Tuesday, Thursday, Saturday and Sunday if systolic BP greater than 130 mmHg 30 tablet 0   midodrine (PROAMATINE) 10 MG tablet Take 1 tablet (10 mg total) by mouth See admin instructions. Take 10mg  by mouth before dialysis. Bring with you to dialysis in case another dose is required. May take 10 mg 3 times a day if systolic BP less than 120 mmHg 90 tablet 11   olopatadine (PATADAY) 0.1 % ophthalmic solution Place 1 drop into both eyes 2 (two) times daily as needed for allergies.     rOPINIRole (REQUIP) 0.25 MG tablet Take 1 tablet (0.25 mg total) by mouth 2 (two) times daily. (Patient taking differently: Take 0.25-0.5 mg by mouth See admin instructions. Take 0.5mg  (1 tablet) by mouth every  night at bedtime and 0.25mg  (1/2 tablet) during the day as needed.) 60 tablet 1   sevelamer carbonate (RENVELA) 800 MG tablet Take 1 tablet (800 mg total) by mouth 3 (three) times daily with meals. (Patient taking differently: Take 800 mg by mouth See admin instructions. Take 800mg  by mouth with meals or snacks.)     hydrALAZINE (APRESOLINE) 50 MG tablet Take 1 tablet (50 mg total) by mouth every 8 (eight) hours as needed (If systolic BP greater than 160 mmHg). 30 tablet 1   No current facility-administered medications for this visit.    Allergies:   Lopid [gemfibrozil], Lotemax [loteprednol etabonate], Statins, Norco [hydrocodone-acetaminophen], Other, Vibra-tab [doxycycline], Amlodipine, Codeine, Hydrocodone, Hydrocodone-acetaminophen, Lisinopril, and Verapamil    Social History:  The patient  reports that she has never smoked. She has never used smokeless tobacco. She reports that she does not drink alcohol and does not use drugs.   Family History:  The patient's family history includes Alzheimer's disease in her sister and sister; Cancer in her brother and father; Heart disease in her mother; Stroke in her mother.    ROS:  Please see the history of present illness.   Otherwise, review of systems are positive for none.   All other systems are reviewed and negative.    PHYSICAL EXAM: VS:  BP 124/70   Pulse 77   Ht 5\' 3"  (1.6 m)   Wt 125 lb (56.7 kg) Comment: verbal weight  SpO2 98%   BMI 22.14 kg/m  , BMI Body mass index is 22.14 kg/m. GEN: chronically ill appearing, in no acute distress.  HEENT: normal Neck: no JVD, carotid bruits, or masses Cardiac: RRR; radiated murmur from her AV fistula. no murmurs, rubs, or gallops,no edema. Functioning AV fistula in left upper arm. 1+ LE edema below the knees Respiratory:  clear to auscultation bilaterally, normal work of breathing GI: soft, nontender, nondistended, + BS MS: no deformity or atrophy Skin: warm and dry, no rash Neuro:   Strength and sensation are intact Psych: euthymic mood, full affect   EKG:  EKG is not ordered today.   Recent Labs: 11/13/2022:  TSH 3.867 12/23/2022: B Natriuretic Peptide 1,423.9 12/26/2022: ALT 44 01/02/2023: Magnesium 1.9 01/10/2023: BUN 26; Creatinine, Ser 2.72; Hemoglobin 8.5; Platelets 173; Potassium 3.3; Sodium 139    Lipid Panel    Component Value Date/Time   CHOL 104 10/27/2022 0550   CHOL 133 08/09/2021 1143   TRIG 84 10/27/2022 0550   HDL 49 10/27/2022 0550   HDL 43 08/09/2021 1143   CHOLHDL 2.1 10/27/2022 0550   VLDL 17 10/27/2022 0550   LDLCALC 38 10/27/2022 0550   LDLCALC 68 08/09/2021 1143      Wt Readings from Last 3 Encounters:  02/13/23 125 lb (56.7 kg)  12/30/22 126 lb 1.7 oz (57.2 kg)  12/16/22 122 lb 12.8 oz (55.7 kg)      Other studies Reviewed: Additional studies/ records that were reviewed today include:   Echo 06/19/13: Study Conclusions   - Left ventricle: The cavity size was normal. There was    severe focal basal and moderate concentric hypertrophy.    Systolic function was normal. The estimated ejection    fraction was in the range of 60% to 65%. Wall motion was    normal; there were no regional wall motion abnormalities.  - Aortic valve: Valve area: 1.69cm^2 (Vmax).  - Mitral valve: Mild to moderate regurgitation.  - Left atrium: The atrium was mildly dilated.  - Atrial septum: There was increased thickness of the    septum, consistent with lipomatous hypertrophy.   Echo 08/27/21: IMPRESSIONS     1. Right ventricular systolic function is moderately reduced. The right  ventricular size is mildly enlarged. There is moderately elevated  pulmonary artery systolic pressure. The estimated right ventricular  systolic pressure is 46.1 mmHg. Normal apical  function with free wall hypokinesis, consistent with McConnell's sign as  can be seen in acute PE (image 58). Consider evaluation for PE.   2. Left ventricular ejection fraction, by  estimation, is 60 to 65%. The  left ventricle has normal function. The left ventricle has no regional  wall motion abnormalities. Left ventricular diastolic parameters are  consistent with Grade III diastolic  dysfunction (restrictive). Elevated left atrial pressure.   3. Left atrial size was severely dilated.   4. Right atrial size was severely dilated.   5. The mitral valve is degenerative. Mild mitral valve regurgitation. No  evidence of mitral stenosis. Moderate mitral annular calcification.   6. Tricuspid valve regurgitation is mild to moderate.   7. The aortic valve is tricuspid. Aortic valve regurgitation is not  visualized. Aortic valve sclerosis is present, with no evidence of aortic  valve stenosis.   8. The inferior vena cava is dilated in size with <50% respiratory  variability, suggesting right atrial pressure of 15 mmHg.   Cardiac cath 08/27/21:  LEFT HEART CATH AND CORONARY ANGIOGRAPHY   Conclusion      Prox RCA lesion is 20% stenosed.   Mid LM to Dist LM lesion is 20% stenosed.   Ost LAD lesion is 30% stenosed.   Mid Cx lesion is 20% stenosed.   Prox LAD lesion is 50% stenosed.   The left ventricular systolic function is normal.   LV end diastolic pressure is normal.   The left ventricular ejection fraction is 55-65% by visual estimate.   There is no mitral valve regurgitation.   Mild non-obstructive CAD Normal LV systolic function Elevated troponin and chest pain is likely due to demand ischemic in the setting of rapid atrial fibrillation.    Recommendation: Medical management of  CAD.   Coronary Diagrams  Diagnostic Dominance: Right  Interve  ASSESSMENT AND PLAN:  1.  ESRD on hemodialysis. Volume status is stable.  Difficult to dialyse due to hypotension. Continue midodrine on dialysis days.  2. Orthostatic hypotension. Now again requiring midodrine on dialysis days. Will continue to monitor. 3. S/p subscapsular renal hematoma- on eliquis- Eliquis was  stopped 4. DM 5. History of HTN 6. Renal tumor s/p ablation 7. Recent sepsis with hydronephrosis and abscess. S/p drain- chronic.  8. Nonobstructive CAD 9. Paroxysmal Afib following ablation procedure in July 2023.  Converted on amiodarone. No recurrence. Still in NSR Continue low dose amiodarone.     Patient is end stage due to ESRD and multiple co morbidities. Little to offer from a CV standpoint. Would favor palliative care but patient not ready.   Disposition:   FU 4 months   Signed, Neiva Maenza Swazil[place], MD  02/13/2023 1:37 PM    [facility] [address], [place], [place], [address] Phone [phone], Fax [phone]

## 2023-02-09 DIAGNOSIS — Z992 Dependence on renal dialysis: Secondary | ICD-10-CM | POA: Diagnosis not present

## 2023-02-09 DIAGNOSIS — N2581 Secondary hyperparathyroidism of renal origin: Secondary | ICD-10-CM | POA: Diagnosis not present

## 2023-02-09 DIAGNOSIS — N186 End stage renal disease: Secondary | ICD-10-CM | POA: Diagnosis not present

## 2023-02-09 DIAGNOSIS — L299 Pruritus, unspecified: Secondary | ICD-10-CM | POA: Diagnosis not present

## 2023-02-10 ENCOUNTER — Ambulatory Visit (HOSPITAL_COMMUNITY)
Admission: RE | Admit: 2023-02-10 | Discharge: 2023-02-10 | Disposition: A | Discharge status: Home / Self Care | Payer: Medicare PPO | Source: Ambulatory Visit | Attending: Interventional Radiology | Admitting: Interventional Radiology

## 2023-02-10 ENCOUNTER — Other Ambulatory Visit (HOSPITAL_COMMUNITY): Payer: Self-pay | Admitting: Interventional Radiology

## 2023-02-10 DIAGNOSIS — C649 Malignant neoplasm of unspecified kidney, except renal pelvis: Secondary | ICD-10-CM

## 2023-02-10 DIAGNOSIS — Z436 Encounter for attention to other artificial openings of urinary tract: Secondary | ICD-10-CM | POA: Diagnosis not present

## 2023-02-10 HISTORY — PX: IR NEPHROSTOMY EXCHANGE LEFT: IMG6069

## 2023-02-10 MED ORDER — LIDOCAINE HCL 1 % IJ SOLN
INTRAMUSCULAR | Status: AC
Start: 2023-02-10 — End: ?
  Filled 2023-02-10: qty 20

## 2023-02-10 MED ORDER — IOHEXOL 300 MG/ML  SOLN
50.0000 mL | Freq: Once | INTRAMUSCULAR | Status: AC | PRN
  Administered 2023-02-10: 20 mL

## 2023-02-10 MED ORDER — LIDOCAINE HCL (PF) 1 % IJ SOLN
10.0000 mL | Freq: Once | INTRAMUSCULAR | Status: AC
  Administered 2023-02-10: 1 mL

## 2023-02-11 DIAGNOSIS — N186 End stage renal disease: Secondary | ICD-10-CM | POA: Diagnosis not present

## 2023-02-11 DIAGNOSIS — Z992 Dependence on renal dialysis: Secondary | ICD-10-CM | POA: Diagnosis not present

## 2023-02-11 DIAGNOSIS — L299 Pruritus, unspecified: Secondary | ICD-10-CM | POA: Diagnosis not present

## 2023-02-11 DIAGNOSIS — N2581 Secondary hyperparathyroidism of renal origin: Secondary | ICD-10-CM | POA: Diagnosis not present

## 2023-02-11 NOTE — Telephone Encounter (Signed)
Lmom to discuss echo results. Waiting on a return call. 

## 2023-02-13 ENCOUNTER — Encounter: Payer: Self-pay | Admitting: Cardiology

## 2023-02-13 ENCOUNTER — Telehealth: Payer: Self-pay | Admitting: Cardiology

## 2023-02-13 ENCOUNTER — Ambulatory Visit: Payer: Medicare PPO | Attending: General Practice | Admitting: Cardiology

## 2023-02-13 VITALS — BP 124/70 | HR 77 | Ht 63.0 in | Wt 125.0 lb

## 2023-02-13 DIAGNOSIS — I480 Paroxysmal atrial fibrillation: Secondary | ICD-10-CM | POA: Diagnosis not present

## 2023-02-13 DIAGNOSIS — N186 End stage renal disease: Secondary | ICD-10-CM | POA: Diagnosis not present

## 2023-02-13 DIAGNOSIS — I2510 Atherosclerotic heart disease of native coronary artery without angina pectoris: Secondary | ICD-10-CM

## 2023-02-13 DIAGNOSIS — I951 Orthostatic hypotension: Secondary | ICD-10-CM

## 2023-02-13 NOTE — Telephone Encounter (Signed)
Patient called and reached after hours 12/24 at 11:45 am.   Per summary documentation " Wants to get her test results, please call."  This was in response to the CMA leaving a VM for callback.  Please advise.

## 2023-02-13 NOTE — Patient Instructions (Signed)
Medication Instructions:  Continue same medications *If you need a refill on your cardiac medications before your next appointment, please call your pharmacy*   Lab Work: None ordered   Testing/Procedures: None ordered   Follow-Up: At [facility], you and your health needs are our priority.  As part of our continuing mission to provide you with exceptional heart care, we have created designated Provider Care Teams.  These Care Teams include your primary Cardiologist (physician) and Advanced Practice Providers (APPs -  Physician Assistants and Nurse Practitioners) who all work together to provide you with the care you need, when you need it.  We recommend signing up for the patient portal called "MyChart".  Sign up information is provided on this After Visit Summary.  MyChart is used to connect with patients for Virtual Visits (Telemedicine).  Patients are able to view lab/test results, encounter notes, upcoming appointments, etc.  Non-urgent messages can be sent to your provider as well.   To learn more about what you can do with MyChart, go to [email or web address].    Your next appointment:  4 months    Provider:  Dr.Jordan   

## 2023-02-13 NOTE — Telephone Encounter (Signed)
Called patient and son answered the phone. I asked to speak to patient and he stated "she's sleep what do you want" I informed him that she had just called the office not 5 minutes ago for results and I was returning her call. He stated "what results for here ECHO she had done on the 12th" I told him yes that's what she was calling for. He asked "why are you calling and we have an appointment today." I informed him again I was only calling because she called for results. He said "she not going to listen what are the results" I read him the message per Bernadene Person, NP and he stated " well I'm sure were going to hear them again at our appointment but I still have questions." Patient is schedule with Dr Swaziland today at 1:20 pm.

## 2023-02-14 DIAGNOSIS — N2581 Secondary hyperparathyroidism of renal origin: Secondary | ICD-10-CM | POA: Diagnosis not present

## 2023-02-14 DIAGNOSIS — Z992 Dependence on renal dialysis: Secondary | ICD-10-CM | POA: Diagnosis not present

## 2023-02-14 DIAGNOSIS — L299 Pruritus, unspecified: Secondary | ICD-10-CM | POA: Diagnosis not present

## 2023-02-14 DIAGNOSIS — N186 End stage renal disease: Secondary | ICD-10-CM | POA: Diagnosis not present

## 2023-02-14 DIAGNOSIS — E109 Type 1 diabetes mellitus without complications: Secondary | ICD-10-CM | POA: Diagnosis not present

## 2023-02-16 DIAGNOSIS — N2581 Secondary hyperparathyroidism of renal origin: Secondary | ICD-10-CM | POA: Diagnosis not present

## 2023-02-16 DIAGNOSIS — N186 End stage renal disease: Secondary | ICD-10-CM | POA: Diagnosis not present

## 2023-02-16 DIAGNOSIS — L299 Pruritus, unspecified: Secondary | ICD-10-CM | POA: Diagnosis not present

## 2023-02-16 DIAGNOSIS — Z992 Dependence on renal dialysis: Secondary | ICD-10-CM | POA: Diagnosis not present

## 2023-02-18 ENCOUNTER — Encounter (HOSPITAL_COMMUNITY): Payer: Self-pay

## 2023-02-18 ENCOUNTER — Other Ambulatory Visit: Payer: Self-pay

## 2023-02-18 ENCOUNTER — Emergency Department (HOSPITAL_COMMUNITY)
Admission: EM | Admit: 2023-02-18 | Discharge: 2023-02-18 | Disposition: A | Discharge status: Home / Self Care | Payer: Medicare PPO | Attending: Emergency Medicine | Admitting: Emergency Medicine

## 2023-02-18 DIAGNOSIS — Z7982 Long term (current) use of aspirin: Secondary | ICD-10-CM | POA: Diagnosis not present

## 2023-02-18 DIAGNOSIS — T82838A Hemorrhage of vascular prosthetic devices, implants and grafts, initial encounter: Secondary | ICD-10-CM | POA: Diagnosis not present

## 2023-02-18 DIAGNOSIS — Z8673 Personal history of transient ischemic attack (TIA), and cerebral infarction without residual deficits: Secondary | ICD-10-CM | POA: Diagnosis not present

## 2023-02-18 DIAGNOSIS — Z992 Dependence on renal dialysis: Secondary | ICD-10-CM | POA: Insufficient documentation

## 2023-02-18 DIAGNOSIS — I120 Hypertensive chronic kidney disease with stage 5 chronic kidney disease or end stage renal disease: Secondary | ICD-10-CM | POA: Diagnosis not present

## 2023-02-18 DIAGNOSIS — L299 Pruritus, unspecified: Secondary | ICD-10-CM | POA: Diagnosis not present

## 2023-02-18 DIAGNOSIS — E1122 Type 2 diabetes mellitus with diabetic chronic kidney disease: Secondary | ICD-10-CM | POA: Insufficient documentation

## 2023-02-18 DIAGNOSIS — I158 Other secondary hypertension: Secondary | ICD-10-CM | POA: Diagnosis not present

## 2023-02-18 DIAGNOSIS — Z79899 Other long term (current) drug therapy: Secondary | ICD-10-CM | POA: Diagnosis not present

## 2023-02-18 DIAGNOSIS — Z794 Long term (current) use of insulin: Secondary | ICD-10-CM | POA: Diagnosis not present

## 2023-02-18 DIAGNOSIS — N2581 Secondary hyperparathyroidism of renal origin: Secondary | ICD-10-CM | POA: Diagnosis not present

## 2023-02-18 DIAGNOSIS — N186 End stage renal disease: Secondary | ICD-10-CM | POA: Insufficient documentation

## 2023-02-18 DIAGNOSIS — D631 Anemia in chronic kidney disease: Secondary | ICD-10-CM | POA: Diagnosis not present

## 2023-02-18 DIAGNOSIS — R58 Hemorrhage, not elsewhere classified: Secondary | ICD-10-CM | POA: Diagnosis not present

## 2023-02-18 LAB — I-STAT CHEM 8, ED
BUN: 25 mg/dL — ABNORMAL HIGH (ref 8–23)
Calcium, Ion: 1.04 mmol/L — ABNORMAL LOW (ref 1.15–1.40)
Chloride: 99 mmol/L (ref 98–111)
Creatinine, Ser: 3.1 mg/dL — ABNORMAL HIGH (ref 0.44–1.00)
Glucose, Bld: 93 mg/dL (ref 70–99)
HCT: 35 % — ABNORMAL LOW (ref 36.0–46.0)
Hemoglobin: 11.9 g/dL — ABNORMAL LOW (ref 12.0–15.0)
Potassium: 3.6 mmol/L (ref 3.5–5.1)
Sodium: 140 mmol/L (ref 135–145)
TCO2: 29 mmol/L (ref 22–32)

## 2023-02-18 NOTE — ED Triage Notes (Signed)
Pt BIB [facility] from dialysis center d/t  bleeding at  fistula site . Staff members were not able to stop the bleeding. Pt finished the entire treatment. A&O X4.

## 2023-02-18 NOTE — Discharge Instructions (Signed)
Keep dressing in place. Have outpatient fistulogram as scheduled. Return if any new or worsening problems

## 2023-02-18 NOTE — ED Provider Notes (Addendum)
 [facility] Provider Note   CSN: [id number] Arrival date & time: 02/18/23  1136     History  Chief Complaint  Patient presents with   fistula bleeding    Kim Burns is a 87 y.o. female.  HPI 87 year old female end-stage renal disease on dialysis sent from dialysis due to bleeding of left upper extremity fistula postdialysis.  Patient's daughter states they have been having some problems with this fistula and she does need a fistulogram.  Complaints of pain or injury distal to injury.  No complaints of lightheadedness     Home Medications Prior to Admission medications   Medication Sig Start Date End Date Taking? Authorizing Provider  acetaminophen  (TYLENOL ) 325 MG tablet Take 2 tablets (650 mg total) by mouth every 6 (six) hours as needed for mild pain (pain score 1-3), moderate pain (pain score 4-6), fever or headache. 01/02/23   Von Bellis, MD  amantadine  (SYMMETREL ) 100 MG capsule Take 1 capsule (100 mg total) by mouth once a week. 11/20/22   Love, Sharlet RAMAN, PA-C  amiodarone  (PACERONE ) 200 MG tablet Take 1 tablet (200 mg total) by mouth daily. 11/15/22   Love, Sharlet RAMAN, PA-C  aspirin  81 MG chewable tablet Chew 1 tablet (81 mg total) by mouth daily. Patient taking differently: Chew 81 mg by mouth daily. Take 81mg  by mouth every morning except on dialysis days, hold in the morning and take after dialysis. 11/13/22   Love, Sharlet RAMAN, PA-C  B Complex-C-Zn-Folic Acid  (DIALYVITE 800 WITH ZINC ) 0.8 MG TABS Take 1 tablet by mouth daily.    [provider]  cycloSPORINE  (RESTASIS ) 0.05 % ophthalmic emulsion Place 1 drop into both eyes daily as needed (irritation). Patient taking differently: Place 1 drop into both eyes daily as needed (dryness). 11/13/22   Love, Sharlet RAMAN, PA-C  diphenhydramine -acetaminophen  (TYLENOL  PM) 25-500 MG TABS tablet Take 2 tablets by mouth at bedtime.    [provider]  hydrALAZINE  (APRESOLINE )  50 MG tablet Take 1 tablet (50 mg total) by mouth every 8 (eight) hours as needed (If systolic BP greater than 160 mmHg). 01/02/23 02/01/23  Von Bellis, MD  insulin  aspart (NOVOLOG  FLEXPEN) 100 UNIT/ML FlexPen Inject 5-7 Units into the skin See admin instructions. Use 5 units if blood sugar over 200. Use 7 units if blood sugar over 250.    [provider]  lidocaine -prilocaine  (EMLA ) cream Apply 1 Application topically every Monday, Wednesday, and Friday with hemodialysis.    [provider]  lipase/protease/amylase (CREON ) 36000 UNITS CPEP capsule Take 36,000 Units by mouth See admin instructions. Take 36,000 units (1 capsule) by mouth with meals and snacks.    [provider]  meclizine  (ANTIVERT ) 25 MG tablet Take 1 tablet (25 mg total) by mouth 3 (three) times daily as needed for dizziness. 01/02/23 01/02/24  Von Bellis, MD  metoprolol  succinate (TOPROL -XL) 25 MG 24 hr tablet Take 1 tablet (25 mg total) by mouth 3 (three) times a week. Take on nondialysis days, Tuesday, Thursday, Saturday and Sunday if systolic BP greater than 130 mmHg 01/03/23   Von Bellis, MD  midodrine  (PROAMATINE ) 10 MG tablet Take 1 tablet (10 mg total) by mouth See admin instructions. Take 10mg  by mouth before dialysis. Bring with you to dialysis in case another dose is required. May take 10 mg 3 times a day if systolic BP less than 120 mmHg 01/02/23 01/02/24  Von Bellis, MD  olopatadine  (PATADAY ) 0.1 % ophthalmic solution Place  1 drop into both eyes 2 (two) times daily as needed for allergies.    [provider]  rOPINIRole  (REQUIP ) 0.25 MG tablet Take 1 tablet (0.25 mg total) by mouth 2 (two) times daily. Patient taking differently: Take 0.25-0.5 mg by mouth See admin instructions. Take 0.5mg  (1 tablet) by mouth every night at bedtime and 0.25mg  (1/2 tablet) during the day as needed. 09/24/22   Sebastian Toribio GAILS, MD  sevelamer  carbonate (RENVELA ) 800 MG tablet Take 1 tablet (800  mg total) by mouth 3 (three) times daily with meals. Patient taking differently: Take 800 mg by mouth See admin instructions. Take 800mg  by mouth with meals or snacks. 11/13/22   Love, Sharlet RAMAN, PA-C      Allergies    Lopid [gemfibrozil], Lotemax [loteprednol etabonate], Statins, Norco [hydrocodone -acetaminophen ], Other, Vibra -tab [doxycycline ], Amlodipine, Codeine, Hydrocodone , Hydrocodone -acetaminophen , Lisinopril, and Verapamil     Review of Systems   Review of Systems  Physical Exam Updated Vital Signs BP (!) 150/83   Pulse 78   Resp (!) 22   SpO2 97%  Physical Exam Vitals and nursing note reviewed.  HENT:     Head: Normocephalic.     Right Ear: External ear normal.     Left Ear: External ear normal.     Nose: Nose normal.     Mouth/Throat:     Pharynx: Oropharynx is clear.  Eyes:     Pupils: Pupils are equal, round, and reactive to light.  Cardiovascular:     Rate and Rhythm: Normal rate and regular rhythm.     Pulses: Normal pulses.  Pulmonary:     Effort: Pulmonary effort is normal.  Abdominal:     Palpations: Abdomen is soft.  Musculoskeletal:     Cervical back: Normal range of motion.     Comments: Actively bleeding left upper extremity fistula No obvious signs of trauma distal to injury  Skin:    General: Skin is warm.     Capillary Refill: Capillary refill takes less than 2 seconds.  Neurological:     General: No focal deficit present.     Mental Status: She is alert.  Psychiatric:        Mood and Affect: Mood normal.     ED Results / Procedures / Treatments   Labs (all labs ordered are listed, but only abnormal results are displayed) Labs Reviewed  I-STAT CHEM 8, ED - Abnormal; Notable for the following components:      Result Value   BUN 25 (*)    Creatinine, Ser 3.10 (*)    Calcium , Ion 1.04 (*)    Hemoglobin 11.9 (*)    HCT 35.0 (*)    All other components within normal limits    EKG None  Radiology No results  found.  Procedures .Critical Care  Performed by: Levander Houston, MD Authorized by: Levander Houston, MD   Critical care provider statement:    Critical care time (minutes):  30   Critical care end time:  02/18/2023 1:41 PM   Critical care was necessary to treat or prevent imminent or life-threatening deterioration of the following conditions:  Circulatory failure   Critical care was time spent personally by me on the following activities:  Development of treatment plan with patient or surrogate, discussions with consultants, evaluation of patient's response to treatment, examination of patient, ordering and review of laboratory studies, ordering and review of radiographic studies, ordering and performing treatments and interventions, pulse oximetry, re-evaluation of patient's condition and review of old  charts     Medications Ordered in ED Medications - No data to display  ED Course/ Medical Decision Making/ A&P                                 Medical Decision Making  Pressure held for 30 minutes no obvious bleeding at this time.  Will consult vascular surgery regarding fistulogram Discussed with vascular who will see to schedule op fistulogram  To cancel and evaluated has set up for outpatient fistulogram. Dressing is in place Patient appears stable for discharge     Final Clinical Impression(s) / ED Diagnoses Final diagnoses:  Bleeding from dialysis shunt, initial encounter [facility])    Rx / DC Orders ED Discharge Orders     None         Levander Houston, MD 02/18/23 1342    Levander Houston, MD 02/18/23 1418

## 2023-02-18 NOTE — ED Notes (Signed)
Hemostatic dressing removed, no bleeding noted.

## 2023-02-18 NOTE — Consult Note (Signed)
 ED Consult    Reason for Consult: Bleeding left arm  Referring Physician: Dr. Levander MRN #:  [id number]  History of Present Illness: This is a 87 y.o. female with history of end-stage renal disease on dialysis via left brachial artery to basilic vein AV fistula.  She has previously undergone intervention over 1 year ago with point angioplasty of the fistula in the mid upper arm.  She presents today with increased bleeding on dialysis.  Per her family at bedside whom are very good historians she has been referred for fistulogram but this has not been performed.  Today she had increased bleeding but techs were not her usual techs at dialysis and 911 was called for hemostasis.  Hemostasis has been obtained we are now consulted for further management.  Past Medical History:  Diagnosis Date   Anemia of chronic disease    takes iron   Anxiety    Blood transfusion without reported diagnosis    Bradycardia    Diabetes mellitus without complication (HCC)    became diabetic after Whipple procedure   Diarrhea    Dysrhythmia 04/16/2016   bradycardia due to medication    ESRD on hemodialysis [facility])    M-W-F   GERD (gastroesophageal reflux disease)    Gout    Headache    History of kidney stones 06/2013   Hyperparathyroidism (HCC)    Hypertension    PONV (postoperative nausea and vomiting)    Sleep apnea    no cpap machine. could not tolerate   Stroke (HCC) 04/16/2016   TIA 1995   Vitamin D  deficiency     Past Surgical History:  Procedure Laterality Date   A/V FISTULAGRAM Left 08/30/2021   Procedure: A/V Fistulagram;  Surgeon: Gretta Lonni PARAS, MD;  Location: [facility] INVASIVE CV LAB;  Service: Cardiovascular;  Laterality: Left;   ABDOMINAL HYSTERECTOMY  1985   complete   BACK SURGERY  1980   lower   BASCILIC VEIN TRANSPOSITION Left 04/24/2017   Procedure: LEFT ARM FIRST STAGE BASILIC VEIN TRANSPOSITION;  Surgeon: Serene Gaile ORN, MD;  Location: [facility] OR;  Service: Vascular;  Laterality:  Left;   BASCILIC VEIN TRANSPOSITION Left 07/10/2017   Procedure: SECOND STAGE BASILIC VEIN TRANSPOSITION LEFT ARM;  Surgeon: Serene Gaile ORN, MD;  Location: [facility] OR;  Service: Vascular;  Laterality: Left;   BREAST LUMPECTOMY Left x 2   many years apart, benign   CHOLECYSTECTOMY  1985   CYSTOSCOPY W/ URETERAL STENT PLACEMENT Left 10/31/2021   Procedure: CYSTOSCOPY WITH RETROGRADE PYELOGRAM/URETERAL STENT PLACEMENT;  Surgeon: Cam Morene ORN, MD;  Location: [facility] OR;  Service: Urology;  Laterality: Left;   CYSTOSCOPY WITH URETEROSCOPY AND STENT PLACEMENT Left 06/18/2013   Procedure: CYSTOSCOPY WITH Lef URETEROSCOPY AND Left STENT PLACEMENT;  Surgeon: Noretta Ferrara, MD;  Location: [facility] ORS;  Service: Urology;  Laterality: Left;   ESOPHAGOGASTRODUODENOSCOPY (EGD) WITH PROPOFOL  N/A 11/13/2016   Procedure: ESOPHAGOGASTRODUODENOSCOPY (EGD) WITH PROPOFOL ;  Surgeon: Elicia Claw, MD;  Location: [facility] ENDOSCOPY;  Service: Gastroenterology;  Laterality: N/A;   EUS N/A 02/07/2016   Procedure: ESOPHAGEAL ENDOSCOPIC ULTRASOUND (EUS) RADIAL;  Surgeon: Elsie Cree, MD;  Location: [facility] ENDOSCOPY;  Service: Endoscopy;  Laterality: N/A;   EYE SURGERY Bilateral 2014   ioc for catracts    FOOT SURGERY Left 1990   something with toes unsure what    HERNIA REPAIR  2018   IR CATHETER TUBE CHANGE  03/14/2022   IR EMBO TUMOR ORGAN ISCHEMIA INFARCT INC GUIDE ROADMAPPING  08/22/2021  IR NEPHROSTOMY EXCHANGE LEFT  06/27/2022   IR NEPHROSTOMY EXCHANGE LEFT  08/26/2022   IR NEPHROSTOMY EXCHANGE LEFT  10/22/2022   IR NEPHROSTOMY EXCHANGE LEFT  12/16/2022   IR NEPHROSTOMY EXCHANGE LEFT  12/30/2022   IR NEPHROSTOMY EXCHANGE LEFT  02/10/2023   IR NEPHROSTOMY PLACEMENT LEFT  03/14/2022   IR RADIOLOGIST EVAL & MGMT  05/18/2021   IR RADIOLOGIST EVAL & MGMT  10/01/2021   IR RADIOLOGIST EVAL & MGMT  11/12/2021   IR RADIOLOGIST EVAL & MGMT  11/22/2021   IR RADIOLOGIST EVAL & MGMT  11/27/2021   IR RADIOLOGIST EVAL & MGMT  12/20/2021   IR  RENAL SUPRASEL UNI S&I MOD SED  08/22/2021   IR US  GUIDE VASC ACCESS RIGHT  08/22/2021   LEFT HEART CATH AND CORONARY ANGIOGRAPHY N/A 08/27/2021   Procedure: LEFT HEART CATH AND CORONARY ANGIOGRAPHY;  Surgeon: Verlin Lonni BIRCH, MD;  Location: [facility] INVASIVE CV LAB;  Service: Cardiovascular;  Laterality: N/A;   PARATHYROID  EXPLORATION     PERIPHERAL VASCULAR BALLOON ANGIOPLASTY Left 08/30/2021   Procedure: PERIPHERAL VASCULAR BALLOON ANGIOPLASTY;  Surgeon: Gretta Lonni PARAS, MD;  Location: [facility] INVASIVE CV LAB;  Service: Cardiovascular;  Laterality: Left;  arm fistula   RADIOLOGY WITH ANESTHESIA Left 08/22/2021   Procedure: MICROWAVE ABLATION;  Surgeon: Adele Rush, MD;  Location: [facility] ORS;  Service: Anesthesiology;  Laterality: Left;   WHIPPLE PROCEDURE N/A 04/23/2016   Procedure: WHIPPLE PROCEDURE;  Surgeon: Jina Nephew, MD;  Location: [facility] OR;  Service: General;  Laterality: N/A;    Allergies  Allergen Reactions   Lopid [Gemfibrozil] Other (See Comments)    Myalgia    Lotemax [Loteprednol Etabonate] Rash    Skin burning  Blurry vision   Statins Other (See Comments)    Rosuvastatin Muscle weakness   Norco [Hydrocodone -Acetaminophen ] Other (See Comments)    Headaches  Tolerates acetaminophen     Other Diarrhea    Real Butter - diarrhea   Vibra -Tab [Doxycycline ] Other (See Comments)    Unknown reaction   Amlodipine Other (See Comments)    Norvasc   Codeine Other (See Comments)    headache   Hydrocodone  Other (See Comments)   Hydrocodone -Acetaminophen  Other (See Comments)    Headache. Tolerates acetaminophen .   Lisinopril Other (See Comments)   Verapamil  Other (See Comments)    Prior to Admission medications   Medication Sig Start Date End Date Taking? Authorizing Provider  acetaminophen  (TYLENOL ) 325 MG tablet Take 2 tablets (650 mg total) by mouth every 6 (six) hours as needed for mild pain (pain score 1-3), moderate pain (pain score 4-6), fever or headache. 01/02/23   Von Bellis, MD  amantadine  (SYMMETREL ) 100 MG capsule Take 1 capsule (100 mg total) by mouth once a week. 11/20/22   Love, Sharlet RAMAN, PA-C  amiodarone  (PACERONE ) 200 MG tablet Take 1 tablet (200 mg total) by mouth daily. 11/15/22   Love, Sharlet RAMAN, PA-C  aspirin  81 MG chewable tablet Chew 1 tablet (81 mg total) by mouth daily. Patient taking differently: Chew 81 mg by mouth daily. Take 81mg  by mouth every morning except on dialysis days, hold in the morning and take after dialysis. 11/13/22   Love, Sharlet RAMAN, PA-C  B Complex-C-Zn-Folic Acid  (DIALYVITE 800 WITH ZINC ) 0.8 MG TABS Take 1 tablet by mouth daily.    [provider]  cycloSPORINE  (RESTASIS ) 0.05 % ophthalmic emulsion Place 1 drop into both eyes daily as needed (irritation). Patient taking differently: Place 1 drop into both eyes daily as  needed (dryness). 11/13/22   Love, Sharlet RAMAN, PA-C  diphenhydramine -acetaminophen  (TYLENOL  PM) 25-500 MG TABS tablet Take 2 tablets by mouth at bedtime.    [provider]  hydrALAZINE  (APRESOLINE ) 50 MG tablet Take 1 tablet (50 mg total) by mouth every 8 (eight) hours as needed (If systolic BP greater than 160 mmHg). 01/02/23 02/01/23  Von Bellis, MD  insulin  aspart (NOVOLOG  FLEXPEN) 100 UNIT/ML FlexPen Inject 5-7 Units into the skin See admin instructions. Use 5 units if blood sugar over 200. Use 7 units if blood sugar over 250.    [provider]  lidocaine -prilocaine  (EMLA ) cream Apply 1 Application topically every Monday, Wednesday, and Friday with hemodialysis.    [provider]  lipase/protease/amylase (CREON ) 36000 UNITS CPEP capsule Take 36,000 Units by mouth See admin instructions. Take 36,000 units (1 capsule) by mouth with meals and snacks.    [provider]  meclizine  (ANTIVERT ) 25 MG tablet Take 1 tablet (25 mg total) by mouth 3 (three) times daily as needed for dizziness. 01/02/23 01/02/24  Von Bellis, MD  metoprolol  succinate (TOPROL -XL) 25 MG 24 hr  tablet Take 1 tablet (25 mg total) by mouth 3 (three) times a week. Take on nondialysis days, Tuesday, Thursday, Saturday and Sunday if systolic BP greater than 130 mmHg 01/03/23   Von Bellis, MD  midodrine  (PROAMATINE ) 10 MG tablet Take 1 tablet (10 mg total) by mouth See admin instructions. Take 10mg  by mouth before dialysis. Bring with you to dialysis in case another dose is required. May take 10 mg 3 times a day if systolic BP less than 120 mmHg 01/02/23 01/02/24  Von Bellis, MD  olopatadine  (PATADAY ) 0.1 % ophthalmic solution Place 1 drop into both eyes 2 (two) times daily as needed for allergies.    [provider]  rOPINIRole  (REQUIP ) 0.25 MG tablet Take 1 tablet (0.25 mg total) by mouth 2 (two) times daily. Patient taking differently: Take 0.25-0.5 mg by mouth See admin instructions. Take 0.5mg  (1 tablet) by mouth every night at bedtime and 0.25mg  (1/2 tablet) during the day as needed. 09/24/22   Sebastian Toribio GAILS, MD  sevelamer  carbonate (RENVELA ) 800 MG tablet Take 1 tablet (800 mg total) by mouth 3 (three) times daily with meals. Patient taking differently: Take 800 mg by mouth See admin instructions. Take 800mg  by mouth with meals or snacks. 11/13/22   Maurice Sharlet RAMAN, PA-C    Social History   Socioeconomic History   Marital status: Widowed    Spouse name: Not on file   Number of children: Not on file   Years of education: Not on file   Highest education level: Not on file  Occupational History   Not on file  Tobacco Use   Smoking status: Never   Smokeless tobacco: Never  Vaping Use   Vaping status: Never Used  Substance and Sexual Activity   Alcohol  use: No   Drug use: No   Sexual activity: Not on file  Other Topics Concern   Not on file  Social History Narrative   Not on file   Social Drivers of Health   Financial Resource Strain: Low Risk  (01/08/2023)   Overall Financial Resource Strain (CARDIA)    Difficulty of Paying Living Expenses: Not hard at all   Food Insecurity: No Food Insecurity (12/24/2022)   Hunger Vital Sign    Worried About Running Out of Food in the Last Year: Never true    Ran Out of Food in the Last  Year: Never true  Transportation Needs: No Transportation Needs (12/24/2022)   PRAPARE - Administrator, Civil Service (Medical): No    Lack of Transportation (Non-Medical): No  Physical Activity: Inactive (02/06/2023)   Exercise Vital Sign    Days of Exercise per Week: 0 days    Minutes of Exercise per Session: 0 min  Stress: No Stress Concern Present (01/08/2023)   [employer] of Occupational Health - Occupational Stress Questionnaire    Feeling of Stress : Not at all  Social Connections: Socially Isolated (01/08/2023)   Social Connection and Isolation Panel [NHANES]    Frequency of Communication with Friends and Family: More than three times a week    Frequency of Social Gatherings with Friends and Family: More than three times a week    Attends Religious Services: Never    Database Administrator or Organizations: No    Attends Banker Meetings: Never    Marital Status: Widowed  Intimate Partner Violence: Not At Risk (12/24/2022)   Humiliation, Afraid, Rape, and Kick questionnaire    Fear of Current or Ex-Partner: No    Emotionally Abused: No    Physically Abused: No    Sexually Abused: No     Family History  Problem Relation Age of Onset   Heart disease Mother    Stroke Mother    Cancer Father        colon   Alzheimer's disease Sister    Alzheimer's disease Sister    Cancer Brother        brain   Breast cancer Neg Hx     Review of Systems  Constitutional: Negative.   HENT: Negative.    Eyes: Negative.   Respiratory: Negative.    Cardiovascular: Negative.   Gastrointestinal: Negative.   Musculoskeletal: Negative.   Skin: Negative.   Neurological: Negative.   Endo/Heme/Allergies:  Bruises/bleeds easily.  Psychiatric/Behavioral: Negative.        Physical  Examination  Vitals:   02/18/23 1345 02/18/23 1354  BP: (!) 157/82   Pulse: 78   Resp: 17   Temp:  98.1 F (36.7 C)  SpO2: 98%    There is no height or weight on file to calculate BMI.  Physical Exam HENT:     Head: Normocephalic.     Nose: Nose normal.  Cardiovascular:     Rate and Rhythm: Normal rate.  Pulmonary:     Effort: Pulmonary effort is normal.  Abdominal:     General: Abdomen is flat.  Musculoskeletal:     Cervical back: Normal range of motion.     Comments: Left upper arm AV fistula has mostly a strong thrill particularly distally or proximally is somewhat pulsatile  Skin:    General: Skin is warm.  Neurological:     General: No focal deficit present.     Mental Status: She is alert.      CBC    Component Value Date/Time   WBC 9.4 01/10/2023 1842   RBC 2.63 (L) 01/10/2023 1842   HGB 11.9 (L) 02/18/2023 1202   HGB 10.2 (L) 06/21/2022 1204   HCT 35.0 (L) 02/18/2023 1202   HCT 28.0 (L) 10/05/2016 1310   PLT 173 01/10/2023 1842   PLT 311 06/21/2022 1204   MCV 111.8 (H) 01/10/2023 1842   MCH 32.3 01/10/2023 1842   MCHC 28.9 (L) 01/10/2023 1842   RDW 21.3 (H) 01/10/2023 1842   LYMPHSABS 1.4 01/01/2023 0358   MONOABS 0.5 01/01/2023 0358  EOSABS 0.2 01/01/2023 0358   BASOSABS 0.0 01/01/2023 0358    BMET    Component Value Date/Time   NA 140 02/18/2023 1202   K 3.6 02/18/2023 1202   CL 99 02/18/2023 1202   CO2 29 01/10/2023 1842   GLUCOSE 93 02/18/2023 1202   BUN 25 (H) 02/18/2023 1202   CREATININE 3.10 (H) 02/18/2023 1202   CREATININE 2.37 (H) 06/21/2022 1204   CALCIUM  8.5 (L) 01/10/2023 1842   CALCIUM  7.6 (L) 02/23/2021 1204   GFRNONAA 17 (L) 01/10/2023 1842   GFRNONAA 20 (L) 06/21/2022 1204   GFRAA 13 (L) 11/12/2019 1240    COAGS: Lab Results  Component Value Date   INR 1.0 03/14/2022   INR 1.4 (H) 01/22/2022   INR 1.2 11/01/2021     Non-Invasive Vascular Imaging:   No studies  ASSESSMENT/PLAN: This is a 87 y.o. female with  end-stage renal disease currently dialyzing via basilic vein fistula in the left with history of intervention of stenosis in the mid upper arm.  She denies increased bleeding after dialysis and quick clot has been placed and I wrapped with gentle Ace bandage.  She is to dialyze this Friday after dialysis was performed on Tuesday today due to the holiday.  She will return to Monday Wednesday Friday dialysis next week.  I discussed the need for fistulogram in the family and patient demonstrated understanding.  Will have this scheduled on a nondialysis day in the near future.  She is okay for discharge from the ED today and knows to call with any further bleeding issues.    Nachmen Mansel C. Sheree, MD [facility] Office: [phone] Pager: [phone]

## 2023-02-20 ENCOUNTER — Other Ambulatory Visit: Payer: Self-pay | Admitting: *Deleted

## 2023-02-20 ENCOUNTER — Encounter: Payer: Self-pay | Admitting: *Deleted

## 2023-02-20 DIAGNOSIS — N186 End stage renal disease: Secondary | ICD-10-CM

## 2023-02-20 MED ORDER — SODIUM CHLORIDE 0.9% FLUSH: 3.0000 mL | Freq: Two times a day (BID) | INTRAVENOUS | Status: DC

## 2023-02-20 MED ORDER — SODIUM CHLORIDE 0.9% FLUSH: 3.0000 mL | INTRAVENOUS | Status: DC | PRN

## 2023-02-20 MED ORDER — SODIUM CHLORIDE 0.9 % IV SOLN: 250.0000 mL | INTRAVENOUS | Status: AC | PRN

## 2023-02-21 DIAGNOSIS — L299 Pruritus, unspecified: Secondary | ICD-10-CM | POA: Diagnosis not present

## 2023-02-21 DIAGNOSIS — N2581 Secondary hyperparathyroidism of renal origin: Secondary | ICD-10-CM | POA: Diagnosis not present

## 2023-02-21 DIAGNOSIS — N186 End stage renal disease: Secondary | ICD-10-CM | POA: Diagnosis not present

## 2023-02-21 DIAGNOSIS — Z992 Dependence on renal dialysis: Secondary | ICD-10-CM | POA: Diagnosis not present

## 2023-02-22 DIAGNOSIS — I5032 Chronic diastolic (congestive) heart failure: Secondary | ICD-10-CM | POA: Diagnosis not present

## 2023-02-22 DIAGNOSIS — N179 Acute kidney failure, unspecified: Secondary | ICD-10-CM | POA: Diagnosis not present

## 2023-02-22 DIAGNOSIS — I5021 Acute systolic (congestive) heart failure: Secondary | ICD-10-CM | POA: Diagnosis not present

## 2023-02-22 DIAGNOSIS — I700 Atherosclerosis of aorta: Secondary | ICD-10-CM | POA: Diagnosis not present

## 2023-02-22 DIAGNOSIS — I132 Hypertensive heart and chronic kidney disease with heart failure and with stage 5 chronic kidney disease, or end stage renal disease: Secondary | ICD-10-CM | POA: Diagnosis not present

## 2023-02-22 DIAGNOSIS — I480 Paroxysmal atrial fibrillation: Secondary | ICD-10-CM | POA: Diagnosis not present

## 2023-02-22 DIAGNOSIS — N186 End stage renal disease: Secondary | ICD-10-CM | POA: Diagnosis not present

## 2023-02-22 DIAGNOSIS — E440 Moderate protein-calorie malnutrition: Secondary | ICD-10-CM | POA: Diagnosis not present

## 2023-02-22 DIAGNOSIS — E1322 Other specified diabetes mellitus with diabetic chronic kidney disease: Secondary | ICD-10-CM | POA: Diagnosis not present

## 2023-02-24 DIAGNOSIS — L299 Pruritus, unspecified: Secondary | ICD-10-CM | POA: Diagnosis not present

## 2023-02-24 DIAGNOSIS — Z992 Dependence on renal dialysis: Secondary | ICD-10-CM | POA: Diagnosis not present

## 2023-02-24 DIAGNOSIS — N2581 Secondary hyperparathyroidism of renal origin: Secondary | ICD-10-CM | POA: Diagnosis not present

## 2023-02-24 DIAGNOSIS — N186 End stage renal disease: Secondary | ICD-10-CM | POA: Diagnosis not present

## 2023-02-25 ENCOUNTER — Encounter (HOSPITAL_COMMUNITY): Payer: Self-pay | Admitting: Internal Medicine

## 2023-02-25 ENCOUNTER — Ambulatory Visit (HOSPITAL_COMMUNITY)
Admission: RE | Admit: 2023-02-25 | Discharge: 2023-02-25 | Disposition: A | Discharge status: Home / Self Care | Payer: Medicare PPO | Attending: Internal Medicine | Admitting: Internal Medicine

## 2023-02-25 ENCOUNTER — Encounter (HOSPITAL_COMMUNITY)
Admission: RE | Disposition: A | Discharge status: Home / Self Care | Payer: Self-pay | Source: Home / Self Care | Attending: Internal Medicine

## 2023-02-25 ENCOUNTER — Other Ambulatory Visit: Payer: Self-pay

## 2023-02-25 DIAGNOSIS — N185 Chronic kidney disease, stage 5: Secondary | ICD-10-CM

## 2023-02-25 DIAGNOSIS — Z7982 Long term (current) use of aspirin: Secondary | ICD-10-CM | POA: Insufficient documentation

## 2023-02-25 DIAGNOSIS — Z992 Dependence on renal dialysis: Secondary | ICD-10-CM | POA: Diagnosis not present

## 2023-02-25 DIAGNOSIS — I5032 Chronic diastolic (congestive) heart failure: Secondary | ICD-10-CM | POA: Diagnosis not present

## 2023-02-25 DIAGNOSIS — Z8673 Personal history of transient ischemic attack (TIA), and cerebral infarction without residual deficits: Secondary | ICD-10-CM | POA: Diagnosis not present

## 2023-02-25 DIAGNOSIS — E1122 Type 2 diabetes mellitus with diabetic chronic kidney disease: Secondary | ICD-10-CM | POA: Diagnosis not present

## 2023-02-25 DIAGNOSIS — E1322 Other specified diabetes mellitus with diabetic chronic kidney disease: Secondary | ICD-10-CM | POA: Diagnosis not present

## 2023-02-25 DIAGNOSIS — T82858A Stenosis of vascular prosthetic devices, implants and grafts, initial encounter: Secondary | ICD-10-CM | POA: Insufficient documentation

## 2023-02-25 DIAGNOSIS — Z794 Long term (current) use of insulin: Secondary | ICD-10-CM | POA: Insufficient documentation

## 2023-02-25 DIAGNOSIS — N186 End stage renal disease: Secondary | ICD-10-CM | POA: Diagnosis not present

## 2023-02-25 DIAGNOSIS — I120 Hypertensive chronic kidney disease with stage 5 chronic kidney disease or end stage renal disease: Secondary | ICD-10-CM | POA: Insufficient documentation

## 2023-02-25 DIAGNOSIS — I132 Hypertensive heart and chronic kidney disease with heart failure and with stage 5 chronic kidney disease, or end stage renal disease: Secondary | ICD-10-CM | POA: Diagnosis not present

## 2023-02-25 DIAGNOSIS — T82898A Other specified complication of vascular prosthetic devices, implants and grafts, initial encounter: Secondary | ICD-10-CM | POA: Diagnosis not present

## 2023-02-25 DIAGNOSIS — I5021 Acute systolic (congestive) heart failure: Secondary | ICD-10-CM | POA: Diagnosis not present

## 2023-02-25 DIAGNOSIS — E440 Moderate protein-calorie malnutrition: Secondary | ICD-10-CM | POA: Diagnosis not present

## 2023-02-25 DIAGNOSIS — I480 Paroxysmal atrial fibrillation: Secondary | ICD-10-CM | POA: Diagnosis not present

## 2023-02-25 DIAGNOSIS — N179 Acute kidney failure, unspecified: Secondary | ICD-10-CM | POA: Diagnosis not present

## 2023-02-25 DIAGNOSIS — Y832 Surgical operation with anastomosis, bypass or graft as the cause of abnormal reaction of the patient, or of later complication, without mention of misadventure at the time of the procedure: Secondary | ICD-10-CM | POA: Diagnosis not present

## 2023-02-25 DIAGNOSIS — I700 Atherosclerosis of aorta: Secondary | ICD-10-CM | POA: Diagnosis not present

## 2023-02-25 HISTORY — PX: A/V FISTULAGRAM: CATH118298

## 2023-02-25 HISTORY — PX: PERIPHERAL VASCULAR BALLOON ANGIOPLASTY: CATH118281

## 2023-02-25 SURGERY — A/V FISTULAGRAM
Anesthesia: LOCAL

## 2023-02-25 MED ORDER — MIDAZOLAM HCL 2 MG/2ML IJ SOLN
INTRAMUSCULAR | Status: AC
Start: 2023-02-25 — End: ?
  Filled 2023-02-25: qty 2

## 2023-02-25 MED ORDER — HEPARIN (PORCINE) IN NACL 1000-0.9 UT/500ML-% IV SOLN
INTRAVENOUS | Status: DC | PRN
  Administered 2023-02-25: 500 mL

## 2023-02-25 MED ORDER — ACETAMINOPHEN 325 MG PO TABS
650.0000 mg | ORAL_TABLET | ORAL | Status: DC | PRN
Start: 2023-02-25 — End: 2023-02-25

## 2023-02-25 MED ORDER — FENTANYL CITRATE (PF) 100 MCG/2ML IJ SOLN
INTRAMUSCULAR | Status: AC
  Filled 2023-02-25: qty 2

## 2023-02-25 MED ORDER — FENTANYL CITRATE (PF) 100 MCG/2ML IJ SOLN
INTRAMUSCULAR | Status: DC | PRN
  Administered 2023-02-25: 25 ug via INTRAVENOUS

## 2023-02-25 MED ORDER — IODIXANOL 320 MG/ML IV SOLN
INTRAVENOUS | Status: DC | PRN
  Administered 2023-02-25: 14 mL via INTRAVENOUS

## 2023-02-25 MED ORDER — SODIUM CHLORIDE 0.9 % IV SOLN: INTRAVENOUS | Status: DC

## 2023-02-25 MED ORDER — LIDOCAINE HCL (PF) 1 % IJ SOLN
INTRAMUSCULAR | Status: AC
  Filled 2023-02-25: qty 30

## 2023-02-25 MED ORDER — ONDANSETRON HCL 4 MG/2ML IJ SOLN: 4.0000 mg | Freq: Four times a day (QID) | INTRAMUSCULAR | Status: DC | PRN

## 2023-02-25 MED ORDER — MIDAZOLAM HCL 2 MG/2ML IJ SOLN
INTRAMUSCULAR | Status: DC | PRN
  Administered 2023-02-25: 1 mg via INTRAVENOUS

## 2023-02-25 MED ORDER — LIDOCAINE HCL (PF) 1 % IJ SOLN
INTRAMUSCULAR | Status: DC | PRN
  Administered 2023-02-25: 2 mL via INTRADERMAL

## 2023-02-25 SURGICAL SUPPLY — 9 items
BALLN MUSTANG 8.0X40 75 (BALLOONS) ×2
BALLOON MUSTANG 8.0X40 75 (BALLOONS) IMPLANT
CATH BEACON 5 .035 65 KMP TIP (CATHETERS) IMPLANT
COVER DOME SNAP 22 D (MISCELLANEOUS) ×2 IMPLANT
GUIDEWIRE ANGLED .035X150CM (WIRE) IMPLANT
KIT MICROPUNCTURE NIT STIFF (SHEATH) IMPLANT
SHEATH PINNACLE R/O II 6F 4CM (SHEATH) IMPLANT
SYR MEDALLION 10ML (SYRINGE) IMPLANT
TRAY PV CATH (CUSTOM PROCEDURE TRAY) ×2 IMPLANT

## 2023-02-25 NOTE — H&P (Signed)
 [facility]  Vascular Access Procedure H&P  Reason for Consultation: prolonged bleeding AVF Requesting Provider: Dr. Ronalee Belts  HPI: Kim Burns is an 88 y.o. female with ESRD on HD, DM, h/o remote CVA, HTN, L percutaneous nephrostomy tube who presents for evaluation of LUE AVF.   AVF placed 2019 and she's had outflow basilic vein angioplasty several times in the past, most recently 02/2022 with 8mm.  Son relates she had outflow angioplasty of 60% stenosis at [facility] in 11/2022.    She had prolonged bleeding after multiple treatments recently, including 1 requiring ED visit on 02/18/23.    She takes ASA daily alone. She has tolerated sedation fine in the past, is NPO with a driver.  No contrast allergy.   PMH: Past Medical History:  Diagnosis Date   Anemia of chronic disease    takes iron   Anxiety    Blood transfusion without reported diagnosis    Bradycardia    Diabetes mellitus without complication (HCC)    became diabetic after Whipple procedure   Diarrhea    Dysrhythmia 04/16/2016   bradycardia due to medication    ESRD on hemodialysis [facility])    M-W-F   GERD (gastroesophageal reflux disease)    Gout    Headache    History of kidney stones 06/2013   Hyperparathyroidism (HCC)    Hypertension    PONV (postoperative nausea and vomiting)    Sleep apnea    no cpap machine. could not tolerate   Stroke (HCC) 04/16/2016   TIA 1995   Vitamin D deficiency    PSH: Past Surgical History:  Procedure Laterality Date   A/V FISTULAGRAM Left 08/30/2021   Procedure: A/V Fistulagram;  Surgeon: Cephus Shelling, MD;  Location: [facility] INVASIVE CV LAB;  Service: Cardiovascular;  Laterality: Left;   ABDOMINAL HYSTERECTOMY  1985   complete   BACK SURGERY  1980   lower   BASCILIC VEIN TRANSPOSITION Left 04/24/2017   Procedure: LEFT ARM FIRST STAGE BASILIC VEIN TRANSPOSITION;  Surgeon: Nada Libman, MD;  Location: [facility] OR;  Service: Vascular;   Laterality: Left;   BASCILIC VEIN TRANSPOSITION Left 07/10/2017   Procedure: SECOND STAGE BASILIC VEIN TRANSPOSITION LEFT ARM;  Surgeon: Nada Libman, MD;  Location: [facility] OR;  Service: Vascular;  Laterality: Left;   BREAST LUMPECTOMY Left x 2   many years apart, benign   CHOLECYSTECTOMY  1985   CYSTOSCOPY W/ URETERAL STENT PLACEMENT Left 10/31/2021   Procedure: CYSTOSCOPY WITH RETROGRADE PYELOGRAM/URETERAL STENT PLACEMENT;  Surgeon: Crist Fat, MD;  Location: [facility] OR;  Service: Urology;  Laterality: Left;   CYSTOSCOPY WITH URETEROSCOPY AND STENT PLACEMENT Left 06/18/2013   Procedure: CYSTOSCOPY WITH Lef URETEROSCOPY AND Left STENT PLACEMENT;  Surgeon: Crecencio Mc, MD;  Location: [facility] ORS;  Service: Urology;  Laterality: Left;   ESOPHAGOGASTRODUODENOSCOPY (EGD) WITH PROPOFOL N/A 11/13/2016   Procedure: ESOPHAGOGASTRODUODENOSCOPY (EGD) WITH PROPOFOL;  Surgeon: Kathi Der, MD;  Location: [facility] ENDOSCOPY;  Service: Gastroenterology;  Laterality: N/A;   EUS N/A 02/07/2016   Procedure: ESOPHAGEAL ENDOSCOPIC ULTRASOUND (EUS) RADIAL;  Surgeon: Willis Modena, MD;  Location: [facility] ENDOSCOPY;  Service: Endoscopy;  Laterality: N/A;   EYE SURGERY Bilateral 2014   ioc for catracts    FOOT SURGERY Left 1990   something with toes unsure what    HERNIA REPAIR  2018   IR CATHETER TUBE CHANGE  03/14/2022   IR EMBO TUMOR ORGAN ISCHEMIA INFARCT INC GUIDE ROADMAPPING  08/22/2021   IR NEPHROSTOMY  EXCHANGE LEFT  06/27/2022   IR NEPHROSTOMY EXCHANGE LEFT  08/26/2022   IR NEPHROSTOMY EXCHANGE LEFT  10/22/2022   IR NEPHROSTOMY EXCHANGE LEFT  12/16/2022   IR NEPHROSTOMY EXCHANGE LEFT  12/30/2022   IR NEPHROSTOMY EXCHANGE LEFT  02/10/2023   IR NEPHROSTOMY PLACEMENT LEFT  03/14/2022   IR RADIOLOGIST EVAL & MGMT  05/18/2021   IR RADIOLOGIST EVAL & MGMT  10/01/2021   IR RADIOLOGIST EVAL & MGMT  11/12/2021   IR RADIOLOGIST EVAL & MGMT  11/22/2021   IR RADIOLOGIST EVAL & MGMT  11/27/2021   IR RADIOLOGIST EVAL  & MGMT  12/20/2021   IR RENAL SUPRASEL UNI S&I MOD SED  08/22/2021   IR US GUIDE VASC ACCESS RIGHT  08/22/2021   LEFT HEART CATH AND CORONARY ANGIOGRAPHY N/A 08/27/2021   Procedure: LEFT HEART CATH AND CORONARY ANGIOGRAPHY;  Surgeon: Kathleene Hazel, MD;  [facility]: [facility] INVASIVE CV LAB;  Service: Cardiovascular;  Laterality: N/A;   PARATHYROID EXPLORATION     PERIPHERAL VASCULAR BALLOON ANGIOPLASTY Left 08/30/2021   Procedure: PERIPHERAL VASCULAR BALLOON ANGIOPLASTY;  Surgeon: Cephus Shelling, MD;  [facility]: [facility] INVAS[facility] CV LAB;  Service: Cardiovascular;  Laterality: Left;  arm fistula   RADIOLOGY WITH ANESTHESIA Left 08/22/2021   Procedure: MICROWAVE ABLATION;  Surgeon: Simonne Come, MD;  Location: [facility] ORS;  Service: Anesthesiology;  Laterality: Left;   WHIPPLE PROCEDURE N/A 04/23/2016   Procedure: WHIPPLE PROCEDURE;  Surgeon: Almond Lint, MD;  Location: [facility] OR;  Service: General;  Laterality: N/A;    Past Medical History:  Diagnosis Date   Anemia of chronic disease    takes iron   Anxiety    Blood transfusion without reported diagnosis    Bradycardia    Diabetes mellitus without complication (HCC)    became diabetic after Whipple procedure   Diarrhea    Dysrhythmia 04/16/2016   bradycardia due to medication    ESRD on hemodialysis [facility])    M-W-F   GERD (gastroesophageal reflux disease)    Gout    Headache    History of kidney stones 06/2013   Hyperparathyroidism (HCC)    Hypertension    PONV (postoperative nausea and vomiting)    Sleep apnea    no cpap machine. could not tolerate   Stroke (HCC) 04/16/2016   TIA 1995   Vitamin D deficiency     Medications:  I have reviewed the patient's current medications.  Facility-Administered Medications Prior to Admission  Medication Dose Route Frequency Provider Last Rate Last Admin   sodium chloride flush (NS) 0.9 % injection 3 mL  3 mL Intravenous Q12H Nada Libman, MD       sodium chloride flush (NS) 0.9 %  injection 3 mL  3 mL Intravenous PRN Nada Libman, MD       Medications Prior to Admission  Medication Sig Dispense Refill   acetaminophen (TYLENOL) 325 MG tablet Take 2 tablets (650 mg total) by mouth every 6 (six) hours as needed for mild pain (pain score 1-3), moderate pain (pain score 4-6), fever or headache.     amantadine (SYMMETREL) 100 MG capsule Take 1 capsule (100 mg total) by mouth once a week. 30 capsule 0   amiodarone (PACERONE) 200 MG tablet Take 1 tablet (200 mg total) by mouth daily. 30 tablet 0   aspirin 81 MG chewable tablet Chew 1 tablet (81 mg total) by mouth daily. (Patient taking differently: Chew 81 mg by mouth daily. Take 81mg  by mouth every morning except  on dialysis days, hold in the morning and take after dialysis.) 90 tablet 0   B Complex-C-Zn-Folic Acid (DIALYVITE 800 WITH ZINC) 0.8 MG TABS Take 1 tablet by mouth daily.     cycloSPORINE (RESTASIS) 0.05 % ophthalmic emulsion Place 1 drop into both eyes daily as needed (irritation). (Patient taking differently: Place 1 drop into both eyes daily as needed (dryness).)     diphenhydramine-acetaminophen (TYLENOL PM) 25-500 MG TABS tablet Take 2 tablets by mouth at bedtime.     hydrALAZINE (APRESOLINE) 50 MG tablet Take 50 mg by mouth every 8 (eight) hours as needed (for BP greater than 160).     insulin aspart (NOVOLOG FLEXPEN) 100 UNIT/ML FlexPen Inject 5-7 Units into the skin See admin instructions. Use 5 units if blood sugar over 200. Use 7 units if blood sugar over 250.     lidocaine-prilocaine (EMLA) cream Apply 1 Application topically every Monday, Wednesday, and Friday with hemodialysis.     lipase/protease/amylase (CREON) 36000 UNITS CPEP capsule Take 36,000 Units by mouth See admin instructions. Take 36,000 units (1 capsule) by mouth with meals and snacks.     meclizine (ANTIVERT) 25 MG tablet Take 1 tablet (25 mg total) by mouth 3 (three) times daily as needed for dizziness. 60 tablet 2   metoprolol succinate  (TOPROL-XL) 25 MG 24 hr tablet Take 1 tablet (25 mg total) by mouth 3 (three) times a week. Take on nondialysis days, Tuesday, Thursday, Saturday and Sunday if systolic BP greater than 130 mmHg 30 tablet 0   midodrine (PROAMATINE) 10 MG tablet Take 1 tablet (10 mg total) by mouth See admin instructions. Take 10mg  by mouth before dialysis. Bring with you to dialysis in case another dose is required. May take 10 mg 3 times a day if systolic BP less than 120 mmHg 90 tablet 11   olopatadine (PATADAY) 0.1 % ophthalmic solution Place 1 drop into both eyes 2 (two) times daily as needed for allergies.     rOPINIRole (REQUIP) 0.25 MG tablet Take 1 tablet (0.25 mg total) by mouth 2 (two) times daily. (Patient taking differently: Take 0.25-0.5 mg by mouth See admin instructions. Take 0.5mg  (1 tablet) by mouth every night at bedtime and 0.25mg  (1/2 tablet) during the day as needed.) 60 tablet 1   sevelamer carbonate (RENVELA) 800 MG tablet Take 1 tablet (800 mg total) by mouth 3 (three) times daily with meals. (Patient taking differently: Take 800 mg by mouth See admin instructions. Take 800mg  by mouth with meals or snacks.)      ALLERGIES:   Allergies  Allergen Reactions   Lopid [Gemfibrozil] Other (See Comments)    Myalgia    Lotemax [Loteprednol Etabonate] Rash    Skin burning  Blurry vision   Statins Other (See Comments)    Rosuvastatin Muscle weakness   Norco [Hydrocodone-Acetaminophen] Other (See Comments)    Headaches  Tolerates acetaminophen    Other Diarrhea    Real Butter - diarrhea   Vibra-Tab [Doxycycline] Other (See Comments)    Unknown reaction   Amlodipine Other (See Comments)    Norvasc   Codeine Other (See Comments)    headache   Hydrocodone Other (See Comments)   Hydrocodone-Acetaminophen Other (See Comments)    Headache. Tolerates acetaminophen.   Lisinopril Other (See Comments)   Verapamil Other (See Comments)    FAM HX: Family History  Problem Relation Age of Onset    Heart disease Mother    Stroke Mother    Cancer Father  colon   Alzheimer's disease Sister    Alzheimer's disease Sister    Cancer Brother        brain   Breast cancer Neg Hx     Social History:   reports that she has never smoked. She has never used smokeless tobacco. She reports that she does not drink alcohol and does not use drugs.  ROS: 12 system ROS neg except per HPI  Blood pressure (!) 190/88, pulse 78, resp. rate 14, SpO2 98%. PHYSICAL EXAM: Gen: calm in bed  Eyes: glasses, anicteric CV: RRR, II/VI SEM Abd: soft Lungs: sats normal on RA Extr: no edema Neuro: AOx3 LUE AVF aneurysmally dilated, pulsatile, non collapsing with elevation of arm.  Thinning skin in central portion of aneurysm which is not being cannulated.    No results found for this or any previous visit (from the past 48 hours).  No results found.  Assessment/Plan:  Kim Burns is an 88 y.o. female with ESRD on HD, DM, h/o remote CVA, HTN, L percutaneous nephrostomy tube who presents for evaluation of LUE AVF.   **ESRD with dialysis access malfunction:  prolonged bleeding likely due to outflow stenosis.  Consented for angiogram with planned angioplasty today.  Discussed potential complications and pt/son agree to proceed.     Tyler Pita 02/25/2023, 7:29 AM

## 2023-02-25 NOTE — H&P (View-Only) (Signed)
[facility]  Vascular Access Procedure H&P  Reason for Consultation: prolonged bleeding AVF Requesting Provider: Dr. Ronalee Belts  HPI: Kim Burns is an 88 y.o. female with ESRD on HD, DM, h/o remote CVA, HTN, L percutaneous nephrostomy tube who presents for evaluation of LUE AVF.   AVF placed 2019 and she's had outflow basilic vein angioplasty several times in the past, most recently 02/2022 with 8mm.  Son relates she had outflow angioplasty of 60% stenosis at [facility] in 11/2022.    She had prolonged bleeding after multiple treatments recently, including 1 requiring ED visit on 02/18/23.    She takes ASA daily alone. She has tolerated sedation fine in the past, is NPO with a driver.  No contrast allergy.   PMH: Past Medical History:  Diagnosis Date   Anemia of chronic disease    takes iron   Anxiety    Blood transfusion without reported diagnosis    Bradycardia    Diabetes mellitus without complication (HCC)    became diabetic after Whipple procedure   Diarrhea    Dysrhythmia 04/16/2016   bradycardia due to medication    ESRD on hemodialysis [facility])    M-W-F   GERD (gastroesophageal reflux disease)    Gout    Headache    History of kidney stones 06/2013   Hyperparathyroidism (HCC)    Hypertension    PONV (postoperative nausea and vomiting)    Sleep apnea    no cpap machine. could not tolerate   Stroke (HCC) 04/16/2016   TIA 1995   Vitamin D deficiency    PSH: Past Surgical History:  Procedure Laterality Date   A/V FISTULAGRAM Left 08/30/2021   Procedure: A/V Fistulagram;  Surgeon: Cephus Shelling, MD;  Location: [facility] INVASIVE CV LAB;  Service: Cardiovascular;  Laterality: Left;   ABDOMINAL HYSTERECTOMY  1985   complete   BACK SURGERY  1980   lower   BASCILIC VEIN TRANSPOSITION Left 04/24/2017   Procedure: LEFT ARM FIRST STAGE BASILIC VEIN TRANSPOSITION;  Surgeon: Nada Libman, MD;  Location: [facility] OR;  Service: Vascular;   Laterality: Left;   BASCILIC VEIN TRANSPOSITION Left 07/10/2017   Procedure: SECOND STAGE BASILIC VEIN TRANSPOSITION LEFT ARM;  Surgeon: Nada Libman, MD;  Location: [facility] OR;  Service: Vascular;  Laterality: Left;   BREAST LUMPECTOMY Left x 2   many years apart, benign   CHOLECYSTECTOMY  1985   CYSTOSCOPY W/ URETERAL STENT PLACEMENT Left 10/31/2021   Procedure: CYSTOSCOPY WITH RETROGRADE PYELOGRAM/URETERAL STENT PLACEMENT;  Surgeon: Crist Fat, MD;  Location: [facility] OR;  Service: Urology;  Laterality: Left;   CYSTOSCOPY WITH URETEROSCOPY AND STENT PLACEMENT Left 06/18/2013   Procedure: CYSTOSCOPY WITH Lef URETEROSCOPY AND Left STENT PLACEMENT;  Surgeon: Crecencio Mc, MD;  Location: [facility] ORS;  Service: Urology;  Laterality: Left;   ESOPHAGOGASTRODUODENOSCOPY (EGD) WITH PROPOFOL N/A 11/13/2016   Procedure: ESOPHAGOGASTRODUODENOSCOPY (EGD) WITH PROPOFOL;  Surgeon: Kathi Der, MD;  Location: [facility] ENDOSCOPY;  Service: Gastroenterology;  Laterality: N/A;   EUS N/A 02/07/2016   Procedure: ESOPHAGEAL ENDOSCOPIC ULTRASOUND (EUS) RADIAL;  Surgeon: Willis Modena, MD;  Location: [facility] ENDOS[facility];  Service: Endoscopy;  Laterality: N/A;   EYE SURGERY Bilateral 2014   ioc for catracts    FOOT SURGERY Left 1990   something with toes unsure what    HERNIA REPAIR  2018   IR CATHETER TUBE CHANGE  03/14/2022   IR EMBO TUMOR ORGAN ISCHEMIA INFARCT INC GUIDE ROADMAPPING  08/22/2021   IR NEPHROSTOMY  EXCHANGE LEFT  06/27/2022   IR NEPHROSTOMY EXCHANGE LEFT  08/26/2022   IR NEPHROSTOMY EXCHANGE LEFT  10/22/2022   IR NEPHROSTOMY EXCHANGE LEFT  12/16/2022   IR NEPHROSTOMY EXCHANGE LEFT  12/30/2022   IR NEPHROSTOMY EXCHANGE LEFT  02/10/2023   IR NEPHROSTOMY PLACEMENT LEFT  03/14/2022   IR RADIOLOGIST EVAL & MGMT  05/18/2021   IR RADIOLOGIST EVAL & MGMT  10/01/2021   IR RADIOLOGIST EVAL & MGMT  11/12/2021   IR RADIOLOGIST EVAL & MGMT  11/22/2021   IR RADIOLOGIST EVAL & MGMT  11/27/2021   IR RADIOLOGIST EVAL  & MGMT  12/20/2021   IR RENAL SUPRASEL UNI S&I MOD SED  08/22/2021   IR US GUIDE VASC ACCESS RIGHT  08/22/2021   LEFT HEART CATH AND CORONARY ANGIOGRAPHY N/A 08/27/2021   Procedure: LEFT HEART CATH AND CORONARY ANGIOGRAPHY;  Surgeon: Kathleene Hazel, MD;  [facility]: [facility] INVAS[facility] LAB;  Service: Cardiovascular;  Laterality: N/A;   PARATHYROID EXPLORATION     PERIPHERAL VASCULAR BALLOON ANGIOPLASTY Left 08/30/2021   Procedure: PERIPHERAL VASCULAR BALLOON ANGIOPLASTY;  Surgeon: Cephus Shelling, MD;  [facility]: [facility]VAS[facility] LAB;  Service: Cardiovascular;  Laterality: Left;  arm fistula   RADIOLOGY WITH ANESTHESIA Left 08/22/2021   Procedure: MICROWAVE ABLATION;  Surgeon: Simonne Come, MD;  Location: [facility] OR[facility];  Service: Anesthesiology;  Laterality: Left;   WHIPPLE PROCEDURE N/A 04/23/2016   Procedure: WHIPPLE PROCEDURE;  Surgeon: Almond Lint, MD;  Location: [facility] OR;  Service: General;  Laterality: N/A;    Past Medical History:  Diagnosis Date   Anemia of chronic disease    takes iron   Anxiety    Blood transfusion without reported diagnosis    Bradycardia    Diabetes mellitus without complication (HCC)    became diabetic after Whipple procedure   Diarrhea    Dysrhythmia 04/16/2016   bradycardia due to medication    ESRD on hemodialysis [facility])    M-W-F   GERD (gastroesophageal reflux disease)    Gout    Headache    History of kidney stones 06/2013   Hyperparathyroidism (HCC)    Hypertension    PONV (postoperative nausea and vomiting)    Sleep apnea    no cpap machine. could not tolerate   Stroke (HCC) 04/16/2016   TIA 1995   Vitamin D deficiency     Medications:  I have reviewed the patient's current medications.  Facility-Administered Medications Prior to Admission  Medication Dose Route Frequency Provider Last Rate Last Admin   sodium chloride flush (NS) 0.9 % injection 3 mL  3 mL Intravenous Q12H Nada Libman, MD       sodium chloride flush (NS) 0.9 %  injection 3 mL  3 mL Intravenous PRN Nada Libman, MD       Medications Prior to Admission  Medication Sig Dispense Refill   acetaminophen (TYLENOL) 325 MG tablet Take 2 tablets (650 mg total) by mouth every 6 (six) hours as needed for mild pain (pain score 1-3), moderate pain (pain score 4-6), fever or headache.     amantadine (SYMMETREL) 100 MG capsule Take 1 capsule (100 mg total) by mouth once a week. 30 capsule 0   amiodarone (PACERONE) 200 MG tablet Take 1 tablet (200 mg total) by mouth daily. 30 tablet 0   aspirin 81 MG chewable tablet Chew 1 tablet (81 mg total) by mouth daily. (Patient taking differently: Chew 81 mg by mouth daily. Take 81mg  by mouth every morning except  on dialysis days, hold in the morning and take after dialysis.) 90 tablet 0   B Complex-C-Zn-Folic Acid (DIALYVITE 800 WITH ZINC) 0.8 MG TABS Take 1 tablet by mouth daily.     cycloSPORINE (RESTASIS) 0.05 % ophthalmic emulsion Place 1 drop into both eyes daily as needed (irritation). (Patient taking differently: Place 1 drop into both eyes daily as needed (dryness).)     diphenhydramine-acetaminophen (TYLENOL PM) 25-500 MG TABS tablet Take 2 tablets by mouth at bedtime.     hydrALAZINE (APRESOLINE) 50 MG tablet Take 50 mg by mouth every 8 (eight) hours as needed (for BP greater than 160).     insulin aspart (NOVOLOG FLEXPEN) 100 UNIT/ML FlexPen Inject 5-7 Units into the skin See admin instructions. Use 5 units if blood sugar over 200. Use 7 units if blood sugar over 250.     lidocaine-prilocaine (EMLA) cream Apply 1 Application topically every Monday, Wednesday, and Friday with hemodialysis.     lipase/protease/amylase (CREON) 36000 UNITS CPEP capsule Take 36,000 Units by mouth See admin instructions. Take 36,000 units (1 capsule) by mouth with meals and snacks.     meclizine (ANTIVERT) 25 MG tablet Take 1 tablet (25 mg total) by mouth 3 (three) times daily as needed for dizziness. 60 tablet 2   metoprolol succinate  (TOPROL-XL) 25 MG 24 hr tablet Take 1 tablet (25 mg total) by mouth 3 (three) times a week. Take on nondialysis days, Tuesday, Thursday, Saturday and Sunday if systolic BP greater than 130 mmHg 30 tablet 0   midodrine (PROAMATINE) 10 MG tablet Take 1 tablet (10 mg total) by mouth See admin instructions. Take 10mg  by mouth before dialysis. Bring with you to dialysis in case another dose is required. May take 10 mg 3 times a day if systolic BP less than 120 mmHg 90 tablet 11   olopatadine (PATADAY) 0.1 % ophthalmic solution Place 1 drop into both eyes 2 (two) times daily as needed for allergies.     rOPINIRole (REQUIP) 0.25 MG tablet Take 1 tablet (0.25 mg total) by mouth 2 (two) times daily. (Patient taking differently: Take 0.25-0.5 mg by mouth See admin instructions. Take 0.5mg  (1 tablet) by mouth every night at bedtime and 0.25mg  (1/2 tablet) during the day as needed.) 60 tablet 1   sevelamer carbonate (RENVELA) 800 MG tablet Take 1 tablet (800 mg total) by mouth 3 (three) times daily with meals. (Patient taking differently: Take 800 mg by mouth See admin instructions. Take 800mg  by mouth with meals or snacks.)      ALLERGIES:   Allergies  Allergen Reactions   Lopid [Gemfibrozil] Other (See Comments)    Myalgia    Lotemax [Loteprednol Etabonate] Rash    Skin burning  Blurry vision   Statins Other (See Comments)    Rosuvastatin Muscle weakness   Norco [Hydrocodone-Acetaminophen] Other (See Comments)    Headaches  Tolerates acetaminophen    Other Diarrhea    Real Butter - diarrhea   Vibra-Tab [Doxycycline] Other (See Comments)    Unknown reaction   Amlodipine Other (See Comments)    Norvasc   Codeine Other (See Comments)    headache   Hydrocodone Other (See Comments)   Hydrocodone-Acetaminophen Other (See Comments)    Headache. Tolerates acetaminophen.   Lisinopril Other (See Comments)   Verapamil Other (See Comments)    FAM HX: Family History  Problem Relation Age of Onset    Heart disease Mother    Stroke Mother    Cancer Father  colon   Alzheimer's disease Sister    Alzheimer's disease Sister    Cancer Brother        brain   Breast cancer Neg Hx     Social History:   reports that she has never smoked. She has never used smokeless tobacco. She reports that she does not drink alcohol and does not use drugs.  ROS: 12 system ROS neg except per HPI  Blood pressure (!) 190/88, pulse 78, resp. rate 14, SpO2 98%. PHYSICAL EXAM: Gen: calm in bed  Eyes: glasses, anicteric CV: RRR, II/VI SEM Abd: soft Lungs: sats normal on RA Extr: no edema Neuro: AOx3 LUE AVF aneurysmally dilated, pulsatile, non collapsing with elevation of arm.  Thinning skin in central portion of aneurysm which is not being cannulated.    No results found for this or any previous visit (from the past 48 hours).  No results found.  Assessment/Plan:  Kim Burns is an 88 y.o. female with ESRD on HD, DM, h/o remote CVA, HTN, L percutaneous nephrostomy tube who presents for evaluation of LUE AVF.   **ESRD with dialysis access malfunction:  prolonged bleeding likely due to outflow stenosis.  Consented for angiogram with planned angioplasty today.  Discussed potential complications and pt/son agree to proceed.     Tyler Pita 02/25/2023, 7:29 AM

## 2023-02-25 NOTE — Op Note (Addendum)
    Patient name: Kim Burns MRN: [id number] DOB: 01-21-1936 Sex: female  02/25/2023 Pre-operative Diagnosis: ESRD, malfunction left arm av fistula Post-operative diagnosis:  Same Surgeon:  Penne BROCKS. Sheree, MD Assistant: Manuelita Barters, MD Procedure Performed: 1.  Left upper extremity fistulogram 2.  Balloon angioplasty left basilic vein fistula with 8 mm Mustang 3.  Moderate sedation with fentanyl  and versed  for 20 minutes  Indications: 88 year old female with history of end-stage renal disease.  She has increased bleeding times on dialysis and has undergone endovascular intervention of the fistula multiple times in the past most recently in October near [place].  She is now indicated for repeat fistulogram and possible intervention.  Findings: The fistula was accessed near the antecubitum.  Central veins including SVC, left innominate, left subclavian and left axillary were all patent.  More peripherally the basilic vein was noted to have a dual system filling the axillary vein and there is a very tight 90% stenosis near the swing segment of the basilic vein.  We elected for intervention of this area and began with 8 millimeter Mustang balloon.  This was inflated at the level of stenosis and completion demonstrated less than 20% residual stenosis with improved flow across the stenosed area and much stronger thrill within the fistula.  The patient has recurrent stenosis and short interval will discuss possible primary stenting of swing segment of basilic vein.   Procedure:  The patient was identified in the holding area and taken to room 8.  The patient was then placed supine on the table and prepped and draped in the usual sterile fashion.  A time out was called.  Moderate sedation with fentanyl  and Versed  was administered and vital signs were monitored throughout the case.  The left arm AV fistula was accessed with a micropuncture needle followed by wire and sheath followed by a Glidewire  and short 6 French sheath.  Left upper extremity fistulogram was performed with the above findings we elected for intervention.  Ultimately the stenosis was crossed with Glidewire this was quite difficult.  We then primarily balloon dilated with an 8 mm balloon and completion demonstrated minimal residual stenosis.  Retrograde imaging was performed although with strong inflow was difficult to visualize the arteriovenous anastomosis.  The sheath was removed and the cannulation site was closed with 3-0 Ethilon suture.  She tolerated the procedure without any complication.   Contrast: 14cc  Alvena Kiernan C. Sheree, MD [facility] Office: [phone] Pager: [phone]

## 2023-02-26 ENCOUNTER — Other Ambulatory Visit: Payer: Self-pay

## 2023-02-26 ENCOUNTER — Encounter (HOSPITAL_COMMUNITY): Payer: Self-pay | Admitting: Vascular Surgery

## 2023-02-26 ENCOUNTER — Emergency Department (HOSPITAL_COMMUNITY)
Admission: EM | Admit: 2023-02-26 | Discharge: 2023-02-26 | Disposition: A | Discharge status: Home / Self Care | Payer: Medicare PPO | Attending: Emergency Medicine | Admitting: Emergency Medicine

## 2023-02-26 DIAGNOSIS — L299 Pruritus, unspecified: Secondary | ICD-10-CM | POA: Diagnosis not present

## 2023-02-26 DIAGNOSIS — Y69 Unspecified misadventure during surgical and medical care: Secondary | ICD-10-CM | POA: Insufficient documentation

## 2023-02-26 DIAGNOSIS — Z7982 Long term (current) use of aspirin: Secondary | ICD-10-CM | POA: Diagnosis not present

## 2023-02-26 DIAGNOSIS — R58 Hemorrhage, not elsewhere classified: Secondary | ICD-10-CM | POA: Diagnosis not present

## 2023-02-26 DIAGNOSIS — N186 End stage renal disease: Secondary | ICD-10-CM | POA: Diagnosis not present

## 2023-02-26 DIAGNOSIS — I10 Essential (primary) hypertension: Secondary | ICD-10-CM | POA: Diagnosis not present

## 2023-02-26 DIAGNOSIS — T82838A Hemorrhage of vascular prosthetic devices, implants and grafts, initial encounter: Secondary | ICD-10-CM | POA: Diagnosis not present

## 2023-02-26 DIAGNOSIS — N2581 Secondary hyperparathyroidism of renal origin: Secondary | ICD-10-CM | POA: Diagnosis not present

## 2023-02-26 DIAGNOSIS — Z992 Dependence on renal dialysis: Secondary | ICD-10-CM | POA: Diagnosis not present

## 2023-02-26 NOTE — ED Triage Notes (Signed)
 PT BIB EMS from dialysis d/t bleeding at fistula site on left upper arm. Unable to get bleeding to stop while at dialysis. Per EMS pt lost a lot of blood. Pt is alert and orient x 4.

## 2023-02-26 NOTE — Discharge Instructions (Signed)
 Follow up with your dialysis center.  Keep it dressed until dialysis.

## 2023-02-26 NOTE — ED Notes (Signed)
 Quikclot gauze and ace applied.

## 2023-02-26 NOTE — ED Provider Notes (Signed)
 [facility] Provider Note   CSN: [id number] Arrival date & time: 02/26/23  1226     History  Chief Complaint  Patient presents with   Vascular Access Problem    Kim Burns is a 88 y.o. female.  88 yo F with a chief complaints of bleeding dialysis access.  The patient has a left AV fistula.  Postdialysis today she was noted to have bleeding that was difficult to get controlled.  EMS reports that she had multiple bloody dressings overlying the skin covered with a dialysis clamp.  They had remove these dressings and put a clamp directly over the site and seem to have had some improvement.  She denies any other specific illness.        Home Medications Prior to Admission medications   Medication Sig Start Date End Date Taking? Authorizing Provider  acetaminophen  (TYLENOL ) 325 MG tablet Take 2 tablets (650 mg total) by mouth every 6 (six) hours as needed for mild pain (pain score 1-3), moderate pain (pain score 4-6), fever or headache. 01/02/23   Von Bellis, MD  amantadine  (SYMMETREL ) 100 MG capsule Take 1 capsule (100 mg total) by mouth once a week. 11/20/22   Love, Sharlet RAMAN, PA-C  amiodarone  (PACERONE ) 200 MG tablet Take 1 tablet (200 mg total) by mouth daily. 11/15/22   Love, Sharlet RAMAN, PA-C  aspirin  81 MG chewable tablet Chew 1 tablet (81 mg total) by mouth daily. Patient taking differently: Chew 81 mg by mouth daily. Take 81mg  by mouth every morning except on dialysis days, hold in the morning and take after dialysis. 11/13/22   Love, Sharlet RAMAN, PA-C  B Complex-C-Zn-Folic Acid  (DIALYVITE 800 WITH ZINC ) 0.8 MG TABS Take 1 tablet by mouth daily.    [provider]  cycloSPORINE  (RESTASIS ) 0.05 % ophthalmic emulsion Place 1 drop into both eyes daily as needed (irritation). Patient taking differently: Place 1 drop into both eyes daily as needed (dryness). 11/13/22   Love, Sharlet RAMAN, PA-C  diphenhydramine -acetaminophen  (TYLENOL  PM)  25-500 MG TABS tablet Take 2 tablets by mouth at bedtime.    [provider]  hydrALAZINE  (APRESOLINE ) 50 MG tablet Take 50 mg by mouth every 8 (eight) hours as needed (for BP greater than 160).    [provider]  insulin  aspart (NOVOLOG  FLEXPEN) 100 UNIT/ML FlexPen Inject 5-7 Units into the skin See admin instructions. Use 5 units if blood sugar over 200. Use 7 units if blood sugar over 250.    [provider]  lidocaine -prilocaine  (EMLA ) cream Apply 1 Application topically every Monday, Wednesday, and Friday with hemodialysis.    [provider]  lipase/protease/amylase (CREON ) 36000 UNITS CPEP capsule Take 36,000 Units by mouth See admin instructions. Take 36,000 units (1 capsule) by mouth with meals and snacks.    [provider]  meclizine  (ANTIVERT ) 25 MG tablet Take 1 tablet (25 mg total) by mouth 3 (three) times daily as needed for dizziness. 01/02/23 01/02/24  Von Bellis, MD  metoprolol  succinate (TOPROL -XL) 25 MG 24 hr tablet Take 1 tablet (25 mg total) by mouth 3 (three) times a week. Take on nondialysis days, Tuesday, Thursday, Saturday and Sunday if systolic BP greater than 130 mmHg 01/03/23   Von Bellis, MD  midodrine  (PROAMATINE ) 10 MG tablet Take 1 tablet (10 mg total) by mouth See admin instructions. Take 10mg  by mouth before dialysis. Bring with you to dialysis in case another dose is required. May take 10 mg  3 times a day if systolic BP less than 120 mmHg 01/02/23 01/02/24  Von Bellis, MD  olopatadine  (PATADAY ) 0.1 % ophthalmic solution Place 1 drop into both eyes 2 (two) times daily as needed for allergies.    [provider]  rOPINIRole  (REQUIP ) 0.25 MG tablet Take 1 tablet (0.25 mg total) by mouth 2 (two) times daily. Patient taking differently: Take 0.25-0.5 mg by mouth See admin instructions. Take 0.5mg  (1 tablet) by mouth every night at bedtime and 0.25mg  (1/2 tablet) during the day as needed. 09/24/22   Sebastian Toribio GAILS, MD  sevelamer  carbonate (RENVELA ) 800 MG tablet Take 1 tablet (800 mg total) by mouth 3 (three) times daily with meals. Patient taking differently: Take 800 mg by mouth See admin instructions. Take 800mg  by mouth with meals or snacks. 11/13/22   Love, Sharlet RAMAN, PA-C      Allergies    Lopid [gemfibrozil], Lotemax [loteprednol etabonate], Statins, Norco [hydrocodone -acetaminophen ], Other, Vibra -tab [doxycycline ], Amlodipine, Codeine, Hydrocodone , Hydrocodone -acetaminophen , Lisinopril, and Verapamil     Review of Systems   Review of Systems  Physical Exam Updated Vital Signs BP (!) 157/78 (BP Location: Right Arm)   Pulse 71   Temp 97.6 F (36.4 C) (Oral)   Resp 19   Wt 56.7 kg   SpO2 92%   BMI 22.14 kg/m  Physical Exam Vitals and nursing note reviewed.  Constitutional:      General: She is not in acute distress.    Appearance: She is well-developed. She is not diaphoretic.  HENT:     Head: Normocephalic and atraumatic.  Eyes:     Pupils: Pupils are equal, round, and reactive to light.  Cardiovascular:     Rate and Rhythm: Normal rate and regular rhythm.     Heart sounds: No murmur heard.    No friction rub. No gallop.  Pulmonary:     Effort: Pulmonary effort is normal.     Breath sounds: No wheezing or rales.  Abdominal:     General: There is no distension.     Palpations: Abdomen is soft.     Tenderness: There is no abdominal tenderness.  Musculoskeletal:        General: No tenderness.     Cervical back: Normal range of motion and neck supple.     Comments: Left AV fistula with a small laceration overlying the skin.  No obvious significant bleeding on my initial exam  Skin:    General: Skin is warm and dry.  Neurological:     Mental Status: She is alert and oriented to person, place, and time.  Psychiatric:        Behavior: Behavior normal.     ED Results / Procedures / Treatments   Labs (all labs ordered are listed, but only abnormal results are  displayed) Labs Reviewed - No data to display  EKG None  Radiology PERIPHERAL VASCULAR CATHETERIZATION Result Date: 02/25/2023 Images from the original result were not included. Patient name: Kim Burns MRN: [id number] DOB: October 10, 1935 Sex: female 02/25/2023 Pre-operative Diagnosis: ESRD, malfunction left arm av fistula Post-operative diagnosis:  Same Surgeon:  Penne BROCKS. Sheree, MD Assistant: Manuelita Barters, MD Procedure Performed: 1.  Left upper extremity fistulogram 2.  Balloon angioplasty left basilic vein fistula with 8 mm Mustang 3.  Moderate sedation with fentanyl  and versed  for 20 minutes Indications: 88 year old female with history of end-stage renal disease.  She has increased bleeding times on dialysis and has undergone endovascular intervention of the fistula multiple times  in the past most recently in October near [place].  She is now indicated for repeat fistulogram and possible intervention. Findings: The fistula was accessed near the antecubitum.  Central veins including SVC, left innominate, left subclavian and left axillary were all patent.  More peripherally the basilic vein was noted to have a dual system filling the axillary vein and there is a very tight 90% stenosis near the swing segment of the basilic vein.  We elected for intervention of this area and began with 8 millimeter Mustang balloon.  This was inflated at the level of stenosis and completion demonstrated less than 20% residual stenosis with improved flow across the stenosed area and much stronger thrill within the fistula. The patient has recurrent stenosis and short interval will discuss possible primary stenting of swing segment of basilic vein.  Procedure:  The patient was identified in the holding area and taken to room 8.  The patient was then placed supine on the table and prepped and draped in the usual sterile fashion.  A time out was called.  Moderate sedation with fentanyl  and Versed  was administered and vital  signs were monitored throughout the case.  The left arm AV fistula was accessed with a micropuncture needle followed by wire and sheath followed by a Glidewire and short 6 French sheath.  Left upper extremity fistulogram was performed with the above findings we elected for intervention.  Ultimately the stenosis was crossed with Glidewire this was quite difficult.  We then primarily balloon dilated with an 8 mm balloon and completion demonstrated minimal residual stenosis.  Retrograde imaging was performed although with strong inflow was difficult to visualize the arteriovenous anastomosis.  The sheath was removed and the cannulation site was closed with 3-0 Ethilon suture.  She tolerated the procedure without any complication. Contrast: 14cc Brandon C. Sheree, MD [facility] Office: [phone] Pager: [phone]   Procedures Procedures    Medications Ordered in ED Medications - No data to display  ED Course/ Medical Decision Making/ A&P                                 Medical Decision Making  88 yo F with a chief complaints of bleeding from a left-sided AV fistula.  Seems to be almost completely controlled with direct pressure with EMS.  Quick clot was placed and a dressing was applied.  Will observe in the ED.  Patient without ongoing bleeding post 1 hour of obs in the ED.  1:46 PM:  I have discussed the diagnosis/risks/treatment options with the patient.  Evaluation and diagnostic testing in the emergency department does not suggest an emergent condition requiring admission or immediate intervention beyond what has been performed at this time.  They will follow up with PCP. We also discussed returning to the ED immediately if new or worsening sx occur. We discussed the sx which are most concerning (e.g., sudden worsening pain, fever, inability to tolerate by mouth) that necessitate immediate return. Medications administered to the patient during their visit and  any new prescriptions provided to the patient are listed below.  Medications given during this visit Medications - No data to display   The patient appears reasonably screen and/or stabilized for discharge and I doubt any other medical condition or other [facility] requiring further screening, evaluation, or treatment in the ED at this time prior to discharge.  Final Clinical Impression(s) / ED Diagnoses Final diagnoses:  Bleeding from dialysis shunt, initial encounter [facility])    Rx / DC Orders ED Discharge Orders     None         Emil Share, DO 02/26/23 1346

## 2023-02-27 DIAGNOSIS — N186 End stage renal disease: Secondary | ICD-10-CM | POA: Diagnosis not present

## 2023-02-27 DIAGNOSIS — I10 Essential (primary) hypertension: Secondary | ICD-10-CM | POA: Diagnosis not present

## 2023-02-27 DIAGNOSIS — G2581 Restless legs syndrome: Secondary | ICD-10-CM | POA: Diagnosis not present

## 2023-02-27 DIAGNOSIS — E109 Type 1 diabetes mellitus without complications: Secondary | ICD-10-CM | POA: Diagnosis not present

## 2023-02-27 DIAGNOSIS — D631 Anemia in chronic kidney disease: Secondary | ICD-10-CM | POA: Diagnosis not present

## 2023-02-27 DIAGNOSIS — Z79899 Other long term (current) drug therapy: Secondary | ICD-10-CM | POA: Diagnosis not present

## 2023-02-27 DIAGNOSIS — N183 Chronic kidney disease, stage 3 unspecified: Secondary | ICD-10-CM | POA: Diagnosis not present

## 2023-02-28 DIAGNOSIS — N186 End stage renal disease: Secondary | ICD-10-CM | POA: Diagnosis not present

## 2023-02-28 DIAGNOSIS — N2581 Secondary hyperparathyroidism of renal origin: Secondary | ICD-10-CM | POA: Diagnosis not present

## 2023-02-28 DIAGNOSIS — Z992 Dependence on renal dialysis: Secondary | ICD-10-CM | POA: Diagnosis not present

## 2023-02-28 DIAGNOSIS — H04122 Dry eye syndrome of left lacrimal gland: Secondary | ICD-10-CM | POA: Diagnosis not present

## 2023-02-28 DIAGNOSIS — L299 Pruritus, unspecified: Secondary | ICD-10-CM | POA: Diagnosis not present

## 2023-02-28 DIAGNOSIS — H04121 Dry eye syndrome of right lacrimal gland: Secondary | ICD-10-CM | POA: Diagnosis not present

## 2023-03-03 DIAGNOSIS — N186 End stage renal disease: Secondary | ICD-10-CM | POA: Diagnosis not present

## 2023-03-03 DIAGNOSIS — L299 Pruritus, unspecified: Secondary | ICD-10-CM | POA: Diagnosis not present

## 2023-03-03 DIAGNOSIS — Z992 Dependence on renal dialysis: Secondary | ICD-10-CM | POA: Diagnosis not present

## 2023-03-03 DIAGNOSIS — N2581 Secondary hyperparathyroidism of renal origin: Secondary | ICD-10-CM | POA: Diagnosis not present

## 2023-03-05 DIAGNOSIS — N186 End stage renal disease: Secondary | ICD-10-CM | POA: Diagnosis not present

## 2023-03-05 DIAGNOSIS — Z992 Dependence on renal dialysis: Secondary | ICD-10-CM | POA: Diagnosis not present

## 2023-03-05 DIAGNOSIS — N2581 Secondary hyperparathyroidism of renal origin: Secondary | ICD-10-CM | POA: Diagnosis not present

## 2023-03-05 DIAGNOSIS — L299 Pruritus, unspecified: Secondary | ICD-10-CM | POA: Diagnosis not present

## 2023-03-06 DIAGNOSIS — N186 End stage renal disease: Secondary | ICD-10-CM | POA: Diagnosis not present

## 2023-03-06 DIAGNOSIS — I5021 Acute systolic (congestive) heart failure: Secondary | ICD-10-CM | POA: Diagnosis not present

## 2023-03-06 DIAGNOSIS — I480 Paroxysmal atrial fibrillation: Secondary | ICD-10-CM | POA: Diagnosis not present

## 2023-03-06 DIAGNOSIS — E1322 Other specified diabetes mellitus with diabetic chronic kidney disease: Secondary | ICD-10-CM | POA: Diagnosis not present

## 2023-03-06 DIAGNOSIS — I132 Hypertensive heart and chronic kidney disease with heart failure and with stage 5 chronic kidney disease, or end stage renal disease: Secondary | ICD-10-CM | POA: Diagnosis not present

## 2023-03-06 DIAGNOSIS — I5032 Chronic diastolic (congestive) heart failure: Secondary | ICD-10-CM | POA: Diagnosis not present

## 2023-03-06 DIAGNOSIS — N179 Acute kidney failure, unspecified: Secondary | ICD-10-CM | POA: Diagnosis not present

## 2023-03-06 DIAGNOSIS — E440 Moderate protein-calorie malnutrition: Secondary | ICD-10-CM | POA: Diagnosis not present

## 2023-03-06 DIAGNOSIS — I700 Atherosclerosis of aorta: Secondary | ICD-10-CM | POA: Diagnosis not present

## 2023-03-07 DIAGNOSIS — N2581 Secondary hyperparathyroidism of renal origin: Secondary | ICD-10-CM | POA: Diagnosis not present

## 2023-03-07 DIAGNOSIS — Z992 Dependence on renal dialysis: Secondary | ICD-10-CM | POA: Diagnosis not present

## 2023-03-07 DIAGNOSIS — N186 End stage renal disease: Secondary | ICD-10-CM | POA: Diagnosis not present

## 2023-03-07 DIAGNOSIS — L299 Pruritus, unspecified: Secondary | ICD-10-CM | POA: Diagnosis not present

## 2023-03-08 DIAGNOSIS — W19XXXA Unspecified fall, initial encounter: Secondary | ICD-10-CM | POA: Diagnosis not present

## 2023-03-08 DIAGNOSIS — S51019A Laceration without foreign body of unspecified elbow, initial encounter: Secondary | ICD-10-CM | POA: Diagnosis not present

## 2023-03-08 DIAGNOSIS — L0390 Cellulitis, unspecified: Secondary | ICD-10-CM | POA: Diagnosis not present

## 2023-03-08 DIAGNOSIS — S81819A Laceration without foreign body, unspecified lower leg, initial encounter: Secondary | ICD-10-CM | POA: Diagnosis not present

## 2023-03-08 DIAGNOSIS — N186 End stage renal disease: Secondary | ICD-10-CM | POA: Diagnosis not present

## 2023-03-08 DIAGNOSIS — N183 Chronic kidney disease, stage 3 unspecified: Secondary | ICD-10-CM | POA: Diagnosis not present

## 2023-03-08 DIAGNOSIS — Z992 Dependence on renal dialysis: Secondary | ICD-10-CM | POA: Diagnosis not present

## 2023-03-10 DIAGNOSIS — L0390 Cellulitis, unspecified: Secondary | ICD-10-CM | POA: Diagnosis not present

## 2023-03-10 DIAGNOSIS — I10 Essential (primary) hypertension: Secondary | ICD-10-CM | POA: Diagnosis not present

## 2023-03-10 DIAGNOSIS — S91309A Unspecified open wound, unspecified foot, initial encounter: Secondary | ICD-10-CM | POA: Diagnosis not present

## 2023-03-10 DIAGNOSIS — N183 Chronic kidney disease, stage 3 unspecified: Secondary | ICD-10-CM | POA: Diagnosis not present

## 2023-03-11 ENCOUNTER — Telehealth: Payer: Self-pay | Admitting: Cardiology

## 2023-03-11 DIAGNOSIS — Z992 Dependence on renal dialysis: Secondary | ICD-10-CM | POA: Diagnosis not present

## 2023-03-11 DIAGNOSIS — L299 Pruritus, unspecified: Secondary | ICD-10-CM | POA: Diagnosis not present

## 2023-03-11 DIAGNOSIS — N186 End stage renal disease: Secondary | ICD-10-CM | POA: Diagnosis not present

## 2023-03-11 DIAGNOSIS — N2581 Secondary hyperparathyroidism of renal origin: Secondary | ICD-10-CM | POA: Diagnosis not present

## 2023-03-11 DIAGNOSIS — L0390 Cellulitis, unspecified: Secondary | ICD-10-CM | POA: Diagnosis not present

## 2023-03-11 DIAGNOSIS — I10 Essential (primary) hypertension: Secondary | ICD-10-CM | POA: Diagnosis not present

## 2023-03-11 NOTE — Telephone Encounter (Signed)
Patient identification verified by 2 forms. Kim Rail, RN    Called and spoke to patients son Vickie Epley provider message below  [name] states:   -would like to know if on non-dialysis days, if patient has a SBP 180, should she be given metoprolol and hydralazine at the same time  Ruffin Pyo:   -If on non-dialysis days pt SBP 180, give Metoprolol first, recheck BP in 2 hours, if BP still elevated/unchanged give PRN hydralazine  [name] verbalized understanding, no questions at this time

## 2023-03-11 NOTE — Telephone Encounter (Signed)
Pt c/o medication issue:  1. Name of Medication: hydrALAZINE (APRESOLINE) 50 MG tablet   metoprolol succinate (TOPROL-XL) 25 MG 24 hr tablet    2. How are you currently taking this medication (dosage and times per day)?  Take 50 mg by mouth every 8 (eight) hours as needed (for BP greater than 160).     Take 1 tablet (25 mg total) by mouth 3 (three) times a week. Take on nondialysis days, Tuesday, Thursday, Saturday and Sunday if systolic BP greater than 130 mmHg      3. Are you having a reaction (difficulty breathing--STAT)? No  4. What is your medication issue? Pt's son is requesting a callback regarding when and how to give these medications to pt on non dialysis days. Please advise.

## 2023-03-11 NOTE — Telephone Encounter (Signed)
Called and spoke to patient's son. Verfied name and DOB. He is calling to get clarification for her Metoprolol and Hydralazine. He wants to know if patient wake up on her non dialysis days and her BP is elevated, do he give her both the Hydralazine and Metoprolol, or do he give her the Hydralazine and not the Metoprolol. He asked should he give her the Metoprolol ans not the Hydralazine. PCP asked him to get clarification. Please advised.

## 2023-03-11 NOTE — Telephone Encounter (Signed)
[place], Peter M, MD  Cv Div Nl Triage; Charna Elizabeth, LPN4 minutes ago (1:36 PM)    Should take the metoprolol on all nondialysis days and use hydralazine PRN if BP high  Peter [place] MD, [facility]

## 2023-03-12 DIAGNOSIS — L299 Pruritus, unspecified: Secondary | ICD-10-CM | POA: Diagnosis not present

## 2023-03-12 DIAGNOSIS — M2040 Other hammer toe(s) (acquired), unspecified foot: Secondary | ICD-10-CM | POA: Diagnosis not present

## 2023-03-12 DIAGNOSIS — N186 End stage renal disease: Secondary | ICD-10-CM | POA: Diagnosis not present

## 2023-03-12 DIAGNOSIS — I10 Essential (primary) hypertension: Secondary | ICD-10-CM | POA: Diagnosis not present

## 2023-03-12 DIAGNOSIS — Z992 Dependence on renal dialysis: Secondary | ICD-10-CM | POA: Diagnosis not present

## 2023-03-12 DIAGNOSIS — N2581 Secondary hyperparathyroidism of renal origin: Secondary | ICD-10-CM | POA: Diagnosis not present

## 2023-03-12 DIAGNOSIS — M21619 Bunion of unspecified foot: Secondary | ICD-10-CM | POA: Diagnosis not present

## 2023-03-12 DIAGNOSIS — L0390 Cellulitis, unspecified: Secondary | ICD-10-CM | POA: Diagnosis not present

## 2023-03-13 ENCOUNTER — Ambulatory Visit (HOSPITAL_COMMUNITY)
Admission: RE | Admit: 2023-03-13 | Discharge: 2023-03-13 | Disposition: A | Discharge status: Home / Self Care | Payer: Medicare PPO | Attending: Nephrology | Admitting: Nephrology

## 2023-03-13 ENCOUNTER — Other Ambulatory Visit: Payer: Self-pay

## 2023-03-13 ENCOUNTER — Encounter (HOSPITAL_COMMUNITY)
Admission: RE | Disposition: A | Discharge status: Home / Self Care | Payer: Self-pay | Source: Home / Self Care | Attending: Nephrology

## 2023-03-13 DIAGNOSIS — N186 End stage renal disease: Secondary | ICD-10-CM | POA: Insufficient documentation

## 2023-03-13 DIAGNOSIS — E1122 Type 2 diabetes mellitus with diabetic chronic kidney disease: Secondary | ICD-10-CM | POA: Insufficient documentation

## 2023-03-13 DIAGNOSIS — Z992 Dependence on renal dialysis: Secondary | ICD-10-CM | POA: Insufficient documentation

## 2023-03-13 DIAGNOSIS — I120 Hypertensive chronic kidney disease with stage 5 chronic kidney disease or end stage renal disease: Secondary | ICD-10-CM | POA: Insufficient documentation

## 2023-03-13 DIAGNOSIS — Z7982 Long term (current) use of aspirin: Secondary | ICD-10-CM | POA: Insufficient documentation

## 2023-03-13 DIAGNOSIS — Z794 Long term (current) use of insulin: Secondary | ICD-10-CM | POA: Insufficient documentation

## 2023-03-13 DIAGNOSIS — Z8673 Personal history of transient ischemic attack (TIA), and cerebral infarction without residual deficits: Secondary | ICD-10-CM | POA: Diagnosis not present

## 2023-03-13 HISTORY — PX: DIALYSIS/PERMA CATHETER INSERTION: CATH118288

## 2023-03-13 SURGERY — DIALYSIS/PERMA CATHETER INSERTION
Anesthesia: LOCAL

## 2023-03-13 MED ORDER — LIDOCAINE HCL (PF) 1 % IJ SOLN
INTRAMUSCULAR | Status: DC | PRN
  Administered 2023-03-13: 10 mL via INTRADERMAL

## 2023-03-13 MED ORDER — HEPARIN (PORCINE) IN NACL 1000-0.9 UT/500ML-% IV SOLN
INTRAVENOUS | Status: DC | PRN
  Administered 2023-03-13: 500 mL

## 2023-03-13 MED ORDER — MIDAZOLAM HCL 2 MG/2ML IJ SOLN
INTRAMUSCULAR | Status: DC | PRN
  Administered 2023-03-13: 1 mg via INTRAVENOUS

## 2023-03-13 MED ORDER — FENTANYL CITRATE (PF) 100 MCG/2ML IJ SOLN
INTRAMUSCULAR | Status: DC | PRN
  Administered 2023-03-13: 25 ug via INTRAVENOUS

## 2023-03-13 MED ORDER — FENTANYL CITRATE (PF) 100 MCG/2ML IJ SOLN
INTRAMUSCULAR | Status: AC
  Filled 2023-03-13: qty 2

## 2023-03-13 MED ORDER — LIDOCAINE HCL (PF) 1 % IJ SOLN
INTRAMUSCULAR | Status: AC
  Filled 2023-03-13: qty 30

## 2023-03-13 MED ORDER — HEPARIN SODIUM (PORCINE) 1000 UNIT/ML IJ SOLN
INTRAMUSCULAR | Status: AC
Start: 2023-03-13 — End: ?
  Filled 2023-03-13: qty 10

## 2023-03-13 MED ORDER — MIDAZOLAM HCL 2 MG/2ML IJ SOLN
INTRAMUSCULAR | Status: AC
  Filled 2023-03-13: qty 2

## 2023-03-13 MED ORDER — SODIUM CHLORIDE 0.9 % IV SOLN: INTRAVENOUS | Status: DC

## 2023-03-13 MED ORDER — HEPARIN SODIUM (PORCINE) 1000 UNIT/ML IJ SOLN
INTRAMUSCULAR | Status: DC | PRN
  Administered 2023-03-13 (×2): 1600 [IU] via INTRAVENOUS

## 2023-03-13 MED ORDER — IODIXANOL 320 MG/ML IV SOLN
INTRAVENOUS | Status: DC | PRN
  Administered 2023-03-13: 2 mL via INTRAVENOUS

## 2023-03-13 SURGICAL SUPPLY — 7 items
BAG SNAP BAND KOVER 36X36 (MISCELLANEOUS) IMPLANT
CATH PALINDROME-P 19CM W/VT (CATHETERS) IMPLANT
COVER DOME SNAP 22 D (MISCELLANEOUS) IMPLANT
GUIDEWIRE ZIPWIRE 035/150 ANGL (WIRE) IMPLANT
SHEATH PROBE COVER 6X72 (BAG) IMPLANT
TRAY PV CATH (CUSTOM PROCEDURE TRAY) ×1 IMPLANT
WIRE MICRO SET SILHO 5FR 7 (SHEATH) IMPLANT

## 2023-03-13 NOTE — Interval H&P Note (Signed)
History and Physical Interval Note:  03/13/2023 12:07 PM  Kim Burns  has presented today for placement of a tunneled hemodialysis catheter with Left BBF infiltration/prolonged bleeding that have persisted about 2 weeks after fistulogram and outflow basilic vein angioplasty.  The intention is to rest the fistula for about 3 to 4 weeks and use the dialysis catheter for the time being.  She would then need repeat fistulogram with possible intervention after that duration.  The various methods of treatment have been discussed with the patient and family. After consideration of risks, benefits and other options for treatment, the patient has consented to  Procedure(s): DIALYSIS/PERMA CATHETER INSERTION (N/A) as a surgical intervention.  The patient's history has been reviewed, patient examined, no change in status, stable for surgery.  I have reviewed the patient's chart and labs.  Questions were answered to the patient's satisfaction.     Dagoberto Ligas

## 2023-03-13 NOTE — Discharge Instructions (Addendum)
Okay to discharge home anytime after 12:30 PM as long as clinically stable.  General care instructions: - Do not drive or operate heavy machinery for 24hrs - Avoid making any important decisions for the remainder of the day. - You should be able to eat, drink, and resume your normal medications. - Avoid any strenuous activity for the remainder of the day. Potential complications: - You are bleeding at the exit or venotomy site and it will not stop with direct pressure; if there is a slow ooze apply pressure over the venotomy site neck region where the catheter can be felt for 5 minutes.  - You have a fever, swelling, see redness or feel heat over the tunnel or exit site. 3.  Medication instructions: - Continue routine medications unless otherwise instructed. 4. Please have your sutures removed at the hub site in 3 weeks at dialysis.

## 2023-03-13 NOTE — Progress Notes (Signed)
Catheter Site Assessment upon Discharge:  Blood-soaked dressing noted initially. Upon removal, no active bleeding observed. Site cleaned, new gauze dressing applied, along with a bandage and transparent cover.  Care Instructions: Keep the site clean and dry. Regular dressing changes to be done at the dialysis center. Patient and son have been educated on the importance of proper site care.  Potential Complications: Uncontrolled bleeding. Excessive pain at the site.  If any of the above complications occur, the patient should seek immediate care in the emergency department.

## 2023-03-13 NOTE — Op Note (Signed)
Patient presents for tunneled catheter insertion to restore infiltrated left upper arm AV fistula with intermittent prolonged bleeding (about 2 weeks after she had outflow basilic vein angioplasty). The ultrasound evaluation shows a large and patent right IJ vein. The decision was made to place a RIJ tunneled dialysis catheter and rest her left brachiobasilic fistula with likely need for repeat fistulogram/angioplasty in 3 to 4 weeks.  Summary:  1) Successful placement of a new 19cm cuff to tip hemodialysis catheter (Palindrome) in the right internal jugular vein with the tip in the right atrium.  2) Recommendations to the dialysis unit TO NOT USE HEPARIN FOR THE FIRST 24 HOURS.  Description of procedure: The right neck, chest and the catheter were prepped and draped in the usual sterile fashion.  Local anesthesia was provided by injecting lidocaine 1% at the neck site overlying the desired venotomy site. Using real time ultrasound guidance, I was able to cannulate the RIJ and advance the guidewire easily into the central circulation. The wire was then manipulated and advanced into the abdominal IVC. I then blunt dissected the tissue around the 5 Fr stylet until it was freely mobile. Then, after injecting local anesthesia into the desired track and exit site I made a small incision at the exit site and tunneled a 19 cm cuff to tip Palindrome dialysis catheter, pulling it out at the venotomy site. Sequential dilation with 12, 14, and finally the peelaway sheath were done with real time fluoroscopic guidance ensuring that we were straight always lined with the wire.   Then, I inserted the catheter over the wire and through the sheath. After removing the peelaway sheath, I adjusted the catheter until the tip of the catheter was positioned in the right atrium of the heart. The cuff of the catheter was positioned in the subcutaneous tunnel with the cuff approximately 2 cm from the exit site.   Both limbs of the  catheter were aspirated and flushed with excellent flow noted. Both limbs of the catheter were locked with heparin and sterile caps placed. The hub of the catheter was sutured to the chest wall with 2-0 nylon suture.   The neck incision was closed with a vertical mattress 3-0 plain gut sutures and a purse-string 3-0 plain gut was placed at the tunnel exit site. 2-0 loose nylon sutures secured the hub to the chest wall.                         Sterile dressings were placed, and the patient returned to recovery in stable condition.  Sedation: 1mg  Versed, Fentanyl. Sedation time: 21 minutes  Contrast. 2 mL  Monitoring: Because of the patient's comorbid conditions and sedation during the procedure, continuous EKG monitoring and O2 saturation monitoring was performed throughout the procedure by the RN. There were no abnormal arrhythmias encountered.  Complications: None.  Diagnoses:   N18.6 End stage renal disease Z99.2 Dependence on renal dialysis  Procedures Coding:  36558 Tunneled catheter insertion [id number] Ultrasound guidance 32202  Fluoroscopy guidance for catheter insertion. R4270 Contrast  Recommendations: Remove the suture in 3 weeks. 2.   Report any blood flow problems to [facility].  Discharge: The patient was discharged home in stable condition. The patient was given education regarding the care of the catheter and specific instructions in case of any problems.

## 2023-03-14 ENCOUNTER — Encounter (HOSPITAL_COMMUNITY): Payer: Self-pay | Admitting: Nephrology

## 2023-03-14 DIAGNOSIS — Z992 Dependence on renal dialysis: Secondary | ICD-10-CM | POA: Diagnosis not present

## 2023-03-14 DIAGNOSIS — N186 End stage renal disease: Secondary | ICD-10-CM | POA: Diagnosis not present

## 2023-03-14 DIAGNOSIS — L299 Pruritus, unspecified: Secondary | ICD-10-CM | POA: Diagnosis not present

## 2023-03-14 DIAGNOSIS — N2581 Secondary hyperparathyroidism of renal origin: Secondary | ICD-10-CM | POA: Diagnosis not present

## 2023-03-15 DIAGNOSIS — N2581 Secondary hyperparathyroidism of renal origin: Secondary | ICD-10-CM | POA: Diagnosis not present

## 2023-03-15 DIAGNOSIS — E8770 Fluid overload, unspecified: Secondary | ICD-10-CM | POA: Diagnosis not present

## 2023-03-15 DIAGNOSIS — Z992 Dependence on renal dialysis: Secondary | ICD-10-CM | POA: Diagnosis not present

## 2023-03-15 DIAGNOSIS — N186 End stage renal disease: Secondary | ICD-10-CM | POA: Diagnosis not present

## 2023-03-17 DIAGNOSIS — N186 End stage renal disease: Secondary | ICD-10-CM | POA: Diagnosis not present

## 2023-03-17 DIAGNOSIS — Z992 Dependence on renal dialysis: Secondary | ICD-10-CM | POA: Diagnosis not present

## 2023-03-17 DIAGNOSIS — L299 Pruritus, unspecified: Secondary | ICD-10-CM | POA: Diagnosis not present

## 2023-03-17 DIAGNOSIS — N2581 Secondary hyperparathyroidism of renal origin: Secondary | ICD-10-CM | POA: Diagnosis not present

## 2023-03-18 DIAGNOSIS — I129 Hypertensive chronic kidney disease with stage 1 through stage 4 chronic kidney disease, or unspecified chronic kidney disease: Secondary | ICD-10-CM | POA: Diagnosis not present

## 2023-03-18 DIAGNOSIS — E1121 Type 2 diabetes mellitus with diabetic nephropathy: Secondary | ICD-10-CM | POA: Diagnosis not present

## 2023-03-18 DIAGNOSIS — Z992 Dependence on renal dialysis: Secondary | ICD-10-CM | POA: Diagnosis not present

## 2023-03-18 DIAGNOSIS — N186 End stage renal disease: Secondary | ICD-10-CM | POA: Diagnosis not present

## 2023-03-18 DIAGNOSIS — E1165 Type 2 diabetes mellitus with hyperglycemia: Secondary | ICD-10-CM | POA: Diagnosis not present

## 2023-03-18 DIAGNOSIS — E1122 Type 2 diabetes mellitus with diabetic chronic kidney disease: Secondary | ICD-10-CM | POA: Diagnosis not present

## 2023-03-18 DIAGNOSIS — I4891 Unspecified atrial fibrillation: Secondary | ICD-10-CM | POA: Diagnosis not present

## 2023-03-18 DIAGNOSIS — L0390 Cellulitis, unspecified: Secondary | ICD-10-CM | POA: Diagnosis not present

## 2023-03-18 DIAGNOSIS — I10 Essential (primary) hypertension: Secondary | ICD-10-CM | POA: Diagnosis not present

## 2023-03-19 DIAGNOSIS — Z992 Dependence on renal dialysis: Secondary | ICD-10-CM | POA: Diagnosis not present

## 2023-03-19 DIAGNOSIS — N186 End stage renal disease: Secondary | ICD-10-CM | POA: Diagnosis not present

## 2023-03-19 DIAGNOSIS — L299 Pruritus, unspecified: Secondary | ICD-10-CM | POA: Diagnosis not present

## 2023-03-19 DIAGNOSIS — N2581 Secondary hyperparathyroidism of renal origin: Secondary | ICD-10-CM | POA: Diagnosis not present

## 2023-03-20 ENCOUNTER — Other Ambulatory Visit: Payer: Self-pay | Admitting: *Deleted

## 2023-03-20 NOTE — Patient Instructions (Signed)
Visit Information  Thank you for taking time to visit with me today. Please don't hesitate to contact me if I can be of assistance to you before our next scheduled telephone appointment.  Following are the goals we discussed today:   Goals Addressed             This Visit's Progress    RNCM Care Management Expected Outcomes: Monitor, Self-Manage and Reduce Symptoms: ESRD, DM, AFIB       Current Barriers:  Knowledge Deficits related to plan of care for management of Atrial Fibrillation, DMII, and ESRD  Chronic Disease Management support and education needs related to Atrial Fibrillation, DMII, and ESRD   Per patient, [facility] has not been to see her in 2-3 weeks. RNCM phoned [facility] and it was noted that she required approval from the PCP for re certification. She stated they sent a fax on 03-09-2023 and 03-17-2023. RNCM to phone PCP office to follow up.  RNCM Clinical Goal(s):  Patient will verbalize basic understanding of  Atrial Fibrillation, DMII, and ESRD disease process and self health management plan as evidenced by verbal explanation, recognizing symptoms, lifestyle modifications and daily monitoring take all medications exactly as prescribed and will call provider for medication related questions as evidenced by compliance with all medications and treatment plan attend all scheduled medical appointments: with primary care provider and specialists as evidenced by keeping all scheduled appointments demonstrate Improved and Ongoing adherence to prescribed treatment plan for Atrial Fibrillation, DMII, and ESRD as evidenced by consistent medication compliance, symptom monitoring, continued lifestyle modifications, and self-management not experience hospital admission as evidenced by review of EMR. Hospital Admissions in last 6 months = 4  through collaboration with RN Care manager, provider, and care team.   Interventions: Evaluation of current treatment plan related to   self management and patient's adherence to plan as established by provider   AFIB Interventions: (Status:  Goal on track:  Yes.) Long Term Goal Counseled on increased risk of stroke due to Afib and benefits of anticoagulation for stroke prevention Reviewed importance of adherence to anticoagulant exactly as prescribed. Patient reports compliance with all medications. Counseled on bleeding risk associated with taking aspirin and importance of self-monitoring for signs/symptoms of bleeding. No recent falls but states she did scrape her arm and had some bleeding. She states this has resolved. Counseled on avoidance of NSAIDs due to increased bleeding risk with anticoagulants Counseled on importance of regular laboratory monitoring as prescribed    End Stage Renal Disease Interventions:  (Status:  Condition stable.  Not addressed this visit.) Long Term Goal Evaluation of current treatment plan related to chronic kidney disease self management and patient's adherence to plan as established by provider. Continues with HD MWF.  Provided education to patient re: stroke prevention, s/s of heart attack and stroke    Reviewed prescribed diet Renal Diet/Diabetic Diet Reviewed medications with patient and discussed importance of compliance. Reports compliance    Counseled on adverse effects of illicit drug and excessive alcohol use in patients with chronic kidney disease    Advised patient, providing education and rationale, to monitor blood pressure daily and record, calling PCP for findings outside established parameters    Discussed complications of poorly controlled blood pressure such as heart disease, stroke, circulatory complications, vision complications, kidney impairment, sexual dysfunction    Reviewed scheduled/upcoming provider appointments including    Discussed plans with patient for ongoing care management follow up and provided patient with direct contact information  for care management team     Last practice recorded BP readings:  BP Readings from Last 3 Encounters:  03/13/23 (!) 173/82  02/26/23 (!) 157/78  02/25/23 (!) 174/88   Most recent eGFR/CrCl: No results found for: "EGFR"  No components found for: "CRCL"    Diabetes Interventions:  (Status:  Goal on track:  Yes.) Long Term Goal Assessed patient's understanding of A1c goal: <6.5% Provided education to patient about basic DM disease process. Patient currently uses Libre 2 and states the lowest blood sugar recently was in the 80s, she was unable to recall the highest because her son wasn't home at the time of the call. She does not recall having any hyperglycemic/hypoglycemic events. Reviewed medications with patient and discussed importance of medication adherence. Reports compliance. Counseled on importance of regular laboratory monitoring as prescribed Discussed plans with patient for ongoing care management follow up and provided patient with direct contact information for care management team Provided patient with written educational materials related to hypo and hyperglycemia and importance of correct treatment Review of patient status, including review of consultants reports, relevant laboratory and other test results, and medications completed Lab Results  Component Value Date   HGBA1C 6.1 (H) 10/27/2022    Patient Goals/Self-Care Activities: Take all medications as prescribed Attend all scheduled provider appointments Call pharmacy for medication refills 3-7 days in advance of running out of medications Call provider office for new concerns or questions  take the blood sugar log to all doctor visits keep feet up while sitting wash and dry feet carefully every day keep all lab appointments check blood pressure daily keep a blood pressure log  Follow Up Plan:  Telephone follow up appointment with care management team member scheduled for:  04-22-2023 at 1:00 pm           Our next appointment is by  telephone on 04-22-2023 at 1:00 pm  Please call the care guide team at [phone] if you need to cancel or reschedule your appointment.   If you are experiencing a Mental Health or Behavioral Health Crisis or need someone to talk to, please call the Suicide and Crisis Lifeline: 988 call the [place] National Suicide Prevention Lifeline: 878-619-0791 or TTY: 330-632-6244 TTY (908)076-0749) to talk to a trained counselor call 1-800-273-TALK (toll free, 24 hour hotline) go to [facility] [address], [place] [phone])   Patient verbalizes understanding of instructions and care plan provided today and agrees to view in MyChart. Active MyChart status and patient understanding of how to access instructions and care plan via MyChart confirmed with patient.     Telephone follow up appointment with care management team member scheduled for:04-22-2023 at 1:00 pm  Larey Brick, BSN RN [facility], [facility] RN Care Manager Direct Dial: [phone]  Fax: [phone]

## 2023-03-20 NOTE — Patient Outreach (Signed)
Care Management   Visit Note  03/20/2023 Name: Kim Burns MRN: [id number] DOB: 06-04-1935  Subjective: Kim Burns is a 88 y.o. year old female who is a primary care patient of Lewis Moccasin, MD. The Care Management team was consulted for assistance.      Engaged with patient spoke with patient by telephone.    Goals Addressed             This Visit's Progress    RNCM Care Management Expected Outcomes: Monitor, Self-Manage and Reduce Symptoms: ESRD, DM, AFIB       Current Barriers:  Knowledge Deficits related to plan of care for management of Atrial Fibrillation, DMII, and ESRD  Chronic Disease Management support and education needs related to Atrial Fibrillation, DMII, and ESRD   Per patient, [facility] has not been to see her in 2-3 weeks. RNCM phoned [facility] and it was noted that she required approval from the PCP for re certification. She stated they sent a fax on 03-09-2023 and 03-17-2023. RNCM to phone PCP office to follow up.  RNCM Clinical Goal(s):  Patient will verbalize basic understanding of  Atrial Fibrillation, DMII, and ESRD disease process and self health management plan as evidenced by verbal explanation, recognizing symptoms, lifestyle modifications and daily monitoring take all medications exactly as prescribed and will call provider for medication related questions as evidenced by compliance with all medications and treatment plan attend all scheduled medical appointments: with primary care provider and specialists as evidenced by keeping all scheduled appointments demonstrate Improved and Ongoing adherence to prescribed treatment plan for Atrial Fibrillation, DMII, and ESRD as evidenced by consistent medication compliance, symptom monitoring, continued lifestyle modifications, and self-management not experience hospital admission as evidenced by review of EMR. Hospital Admissions in last 6 months = 4  through collaboration with RN Care  manager, provider, and care team.   Interventions: Evaluation of current treatment plan related to  self management and patient's adherence to plan as established by provider   AFIB Interventions: (Status:  Goal on track:  Yes.) Long Term Goal Counseled on increased risk of stroke due to Afib and benefits of anticoagulation for stroke prevention Reviewed importance of adherence to anticoagulant exactly as prescribed. Patient reports compliance with all medications. Counseled on bleeding risk associated with taking aspirin and importance of self-monitoring for signs/symptoms of bleeding. No recent falls but states she did scrape her arm and had some bleeding. She states this has resolved. Counseled on avoidance of NSAIDs due to increased bleeding risk with anticoagulants Counseled on importance of regular laboratory monitoring as prescribed    End Stage Renal Disease Interventions:  (Status:  Condition stable.  Not addressed this visit.) Long Term Goal Evaluation of current treatment plan related to chronic kidney disease self management and patient's adherence to plan as established by provider. Continues with HD MWF.  Provided education to patient re: stroke prevention, s/s of heart attack and stroke    Reviewed prescribed diet Renal Diet/Diabetic Diet Reviewed medications with patient and discussed importance of compliance. Reports compliance    Counseled on adverse effects of illicit drug and excessive alcohol use in patients with chronic kidney disease    Advised patient, providing education and rationale, to monitor blood pressure daily and record, calling PCP for findings outside established parameters    Discussed complications of poorly controlled blood pressure such as heart disease, stroke, circulatory complications, vision complications, kidney impairment, sexual dysfunction    Reviewed scheduled/upcoming provider appointments including  Discussed plans with patient for ongoing  care management follow up and provided patient with direct contact information for care management team    Last practice recorded BP readings:  BP Readings from Last 3 Encounters:  03/13/23 (!) 173/82  02/26/23 (!) 157/78  02/25/23 (!) 174/88   Most recent eGFR/CrCl: No results found for: "EGFR"  No components found for: "CRCL"    Diabetes Interventions:  (Status:  Goal on track:  Yes.) Long Term Goal Assessed patient's understanding of A1c goal: <6.5% Provided education to patient about basic DM disease process. Patient currently uses Libre 2 and states the lowest blood sugar recently was in the 80s, she was unable to recall the highest because her son wasn't home at the time of the call. She does not recall having any hyperglycemic/hypoglycemic events. Reviewed medications with patient and discussed importance of medication adherence. Reports compliance. Counseled on importance of regular laboratory monitoring as prescribed Discussed plans with patient for ongoing care management follow up and provided patient with direct contact information for care management team Provided patient with written educational materials related to hypo and hyperglycemia and importance of correct treatment Review of patient status, including review of consultants reports, relevant laboratory and other test results, and medications completed Lab Results  Component Value Date   HGBA1C 6.1 (H) 10/27/2022    Patient Goals/Self-Care Activities: Take all medications as prescribed Attend all scheduled provider appointments Call pharmacy for medication refills 3-7 days in advance of running out of medications Call provider office for new concerns or questions  take the blood sugar log to all doctor visits keep feet up while sitting wash and dry feet carefully every day keep all lab appointments check blood pressure daily keep a blood pressure log  Follow Up Plan:  Telephone follow up appointment with care  management team member scheduled for:  04-22-2023 at 1:00 pm           Consent to Services:  Patient was given information about care management services, agreed to services, and gave verbal consent to participate.   Plan: Telephone follow up appointment with care management team member scheduled for:04-22-2023 at 1:00 pm  Larey Brick, BSN RN [facility], [facility] RN Care Manager Direct Dial: [phone]  Fax: [phone]

## 2023-03-21 DIAGNOSIS — I158 Other secondary hypertension: Secondary | ICD-10-CM | POA: Diagnosis not present

## 2023-03-21 DIAGNOSIS — Z992 Dependence on renal dialysis: Secondary | ICD-10-CM | POA: Diagnosis not present

## 2023-03-21 DIAGNOSIS — L299 Pruritus, unspecified: Secondary | ICD-10-CM | POA: Diagnosis not present

## 2023-03-21 DIAGNOSIS — N2581 Secondary hyperparathyroidism of renal origin: Secondary | ICD-10-CM | POA: Diagnosis not present

## 2023-03-21 DIAGNOSIS — N186 End stage renal disease: Secondary | ICD-10-CM | POA: Diagnosis not present

## 2023-03-24 DIAGNOSIS — L299 Pruritus, unspecified: Secondary | ICD-10-CM | POA: Diagnosis not present

## 2023-03-24 DIAGNOSIS — N186 End stage renal disease: Secondary | ICD-10-CM | POA: Diagnosis not present

## 2023-03-24 DIAGNOSIS — Z992 Dependence on renal dialysis: Secondary | ICD-10-CM | POA: Diagnosis not present

## 2023-03-24 DIAGNOSIS — N2581 Secondary hyperparathyroidism of renal origin: Secondary | ICD-10-CM | POA: Diagnosis not present

## 2023-03-26 ENCOUNTER — Emergency Department (HOSPITAL_BASED_OUTPATIENT_CLINIC_OR_DEPARTMENT_OTHER): Payer: Medicare PPO | Admitting: Radiology

## 2023-03-26 ENCOUNTER — Emergency Department (HOSPITAL_BASED_OUTPATIENT_CLINIC_OR_DEPARTMENT_OTHER): Payer: Medicare PPO

## 2023-03-26 ENCOUNTER — Inpatient Hospital Stay (HOSPITAL_BASED_OUTPATIENT_CLINIC_OR_DEPARTMENT_OTHER)
Admission: EM | Admit: 2023-03-26 | Discharge: 2023-03-29 | DRG: 638 | Disposition: A | Discharge status: Home / Self Care | Payer: Medicare PPO | Attending: Internal Medicine | Admitting: Internal Medicine

## 2023-03-26 ENCOUNTER — Other Ambulatory Visit: Payer: Self-pay

## 2023-03-26 ENCOUNTER — Encounter (HOSPITAL_BASED_OUTPATIENT_CLINIC_OR_DEPARTMENT_OTHER): Payer: Self-pay | Admitting: Emergency Medicine

## 2023-03-26 DIAGNOSIS — Z862 Personal history of diseases of the blood and blood-forming organs and certain disorders involving the immune mechanism: Secondary | ICD-10-CM

## 2023-03-26 DIAGNOSIS — R519 Headache, unspecified: Secondary | ICD-10-CM | POA: Diagnosis not present

## 2023-03-26 DIAGNOSIS — Z888 Allergy status to other drugs, medicaments and biological substances status: Secondary | ICD-10-CM | POA: Diagnosis not present

## 2023-03-26 DIAGNOSIS — S91109A Unspecified open wound of unspecified toe(s) without damage to nail, initial encounter: Secondary | ICD-10-CM | POA: Diagnosis not present

## 2023-03-26 DIAGNOSIS — Z885 Allergy status to narcotic agent status: Secondary | ICD-10-CM

## 2023-03-26 DIAGNOSIS — Z9049 Acquired absence of other specified parts of digestive tract: Secondary | ICD-10-CM

## 2023-03-26 DIAGNOSIS — E1165 Type 2 diabetes mellitus with hyperglycemia: Secondary | ICD-10-CM | POA: Diagnosis present

## 2023-03-26 DIAGNOSIS — E1122 Type 2 diabetes mellitus with diabetic chronic kidney disease: Secondary | ICD-10-CM | POA: Diagnosis not present

## 2023-03-26 DIAGNOSIS — B999 Unspecified infectious disease: Secondary | ICD-10-CM | POA: Diagnosis present

## 2023-03-26 DIAGNOSIS — Z992 Dependence on renal dialysis: Secondary | ICD-10-CM

## 2023-03-26 DIAGNOSIS — R8281 Pyuria: Secondary | ICD-10-CM | POA: Diagnosis not present

## 2023-03-26 DIAGNOSIS — L03039 Cellulitis of unspecified toe: Secondary | ICD-10-CM | POA: Diagnosis not present

## 2023-03-26 DIAGNOSIS — Z90411 Acquired partial absence of pancreas: Secondary | ICD-10-CM

## 2023-03-26 DIAGNOSIS — R0989 Other specified symptoms and signs involving the circulatory and respiratory systems: Secondary | ICD-10-CM | POA: Diagnosis not present

## 2023-03-26 DIAGNOSIS — Z8673 Personal history of transient ischemic attack (TIA), and cerebral infarction without residual deficits: Secondary | ICD-10-CM

## 2023-03-26 DIAGNOSIS — Z800 Family history of malignant neoplasm of digestive organs: Secondary | ICD-10-CM

## 2023-03-26 DIAGNOSIS — I480 Paroxysmal atrial fibrillation: Secondary | ICD-10-CM | POA: Diagnosis present

## 2023-03-26 DIAGNOSIS — Z7982 Long term (current) use of aspirin: Secondary | ICD-10-CM

## 2023-03-26 DIAGNOSIS — Z794 Long term (current) use of insulin: Secondary | ICD-10-CM

## 2023-03-26 DIAGNOSIS — N186 End stage renal disease: Secondary | ICD-10-CM | POA: Diagnosis not present

## 2023-03-26 DIAGNOSIS — N189 Chronic kidney disease, unspecified: Secondary | ICD-10-CM | POA: Diagnosis not present

## 2023-03-26 DIAGNOSIS — I132 Hypertensive heart and chronic kidney disease with heart failure and with stage 5 chronic kidney disease, or end stage renal disease: Secondary | ICD-10-CM | POA: Diagnosis present

## 2023-03-26 DIAGNOSIS — E1169 Type 2 diabetes mellitus with other specified complication: Principal | ICD-10-CM | POA: Diagnosis present

## 2023-03-26 DIAGNOSIS — L97529 Non-pressure chronic ulcer of other part of left foot with unspecified severity: Secondary | ICD-10-CM | POA: Diagnosis present

## 2023-03-26 DIAGNOSIS — Z881 Allergy status to other antibiotic agents status: Secondary | ICD-10-CM

## 2023-03-26 DIAGNOSIS — M2012 Hallux valgus (acquired), left foot: Secondary | ICD-10-CM | POA: Diagnosis not present

## 2023-03-26 DIAGNOSIS — M86172 Other acute osteomyelitis, left ankle and foot: Secondary | ICD-10-CM | POA: Diagnosis not present

## 2023-03-26 DIAGNOSIS — D631 Anemia in chronic kidney disease: Secondary | ICD-10-CM | POA: Diagnosis not present

## 2023-03-26 DIAGNOSIS — Z823 Family history of stroke: Secondary | ICD-10-CM

## 2023-03-26 DIAGNOSIS — M109 Gout, unspecified: Secondary | ICD-10-CM | POA: Diagnosis present

## 2023-03-26 DIAGNOSIS — Z91048 Other nonmedicinal substance allergy status: Secondary | ICD-10-CM

## 2023-03-26 DIAGNOSIS — E11621 Type 2 diabetes mellitus with foot ulcer: Secondary | ICD-10-CM | POA: Diagnosis not present

## 2023-03-26 DIAGNOSIS — I5032 Chronic diastolic (congestive) heart failure: Secondary | ICD-10-CM | POA: Diagnosis not present

## 2023-03-26 DIAGNOSIS — M19072 Primary osteoarthritis, left ankle and foot: Secondary | ICD-10-CM | POA: Diagnosis not present

## 2023-03-26 DIAGNOSIS — M7989 Other specified soft tissue disorders: Secondary | ICD-10-CM | POA: Diagnosis not present

## 2023-03-26 DIAGNOSIS — E872 Acidosis, unspecified: Secondary | ICD-10-CM | POA: Diagnosis present

## 2023-03-26 DIAGNOSIS — L03116 Cellulitis of left lower limb: Secondary | ICD-10-CM | POA: Diagnosis present

## 2023-03-26 DIAGNOSIS — Z8249 Family history of ischemic heart disease and other diseases of the circulatory system: Secondary | ICD-10-CM

## 2023-03-26 DIAGNOSIS — Z1152 Encounter for screening for COVID-19: Secondary | ICD-10-CM | POA: Diagnosis not present

## 2023-03-26 DIAGNOSIS — L299 Pruritus, unspecified: Secondary | ICD-10-CM | POA: Diagnosis not present

## 2023-03-26 DIAGNOSIS — D490 Neoplasm of unspecified behavior of digestive system: Secondary | ICD-10-CM | POA: Diagnosis not present

## 2023-03-26 DIAGNOSIS — Z820 Family history of epilepsy and other diseases of the nervous system: Secondary | ICD-10-CM

## 2023-03-26 DIAGNOSIS — R531 Weakness: Secondary | ICD-10-CM | POA: Diagnosis not present

## 2023-03-26 DIAGNOSIS — R600 Localized edema: Secondary | ICD-10-CM | POA: Diagnosis not present

## 2023-03-26 DIAGNOSIS — Z808 Family history of malignant neoplasm of other organs or systems: Secondary | ICD-10-CM

## 2023-03-26 DIAGNOSIS — Z79899 Other long term (current) drug therapy: Secondary | ICD-10-CM

## 2023-03-26 DIAGNOSIS — N2581 Secondary hyperparathyroidism of renal origin: Secondary | ICD-10-CM | POA: Diagnosis not present

## 2023-03-26 DIAGNOSIS — Z1159 Encounter for screening for other viral diseases: Secondary | ICD-10-CM | POA: Diagnosis not present

## 2023-03-26 DIAGNOSIS — Z0389 Encounter for observation for other suspected diseases and conditions ruled out: Secondary | ICD-10-CM | POA: Diagnosis not present

## 2023-03-26 DIAGNOSIS — K219 Gastro-esophageal reflux disease without esophagitis: Secondary | ICD-10-CM | POA: Diagnosis present

## 2023-03-26 DIAGNOSIS — R509 Fever, unspecified: Secondary | ICD-10-CM | POA: Diagnosis not present

## 2023-03-26 DIAGNOSIS — E119 Type 2 diabetes mellitus without complications: Secondary | ICD-10-CM

## 2023-03-26 HISTORY — DX: Unspecified atrial fibrillation: I48.91

## 2023-03-26 LAB — URINALYSIS, W/ REFLEX TO CULTURE (INFECTION SUSPECTED)
Bacteria, UA: NONE SEEN
Bilirubin Urine: NEGATIVE
Glucose, UA: 100 mg/dL — AB
Ketones, ur: NEGATIVE mg/dL
Leukocytes,Ua: NEGATIVE
Nitrite: NEGATIVE
Protein, ur: 100 mg/dL — AB
Specific Gravity, Urine: 1.011 (ref 1.005–1.030)
pH: 8 (ref 5.0–8.0)

## 2023-03-26 LAB — CBC WITH DIFFERENTIAL/PLATELET
Abs Immature Granulocytes: 0.03 10*3/uL (ref 0.00–0.07)
Basophils Absolute: 0 10*3/uL (ref 0.0–0.1)
Basophils Relative: 0 %
Eosinophils Absolute: 0.1 10*3/uL (ref 0.0–0.5)
Eosinophils Relative: 1 %
HCT: 29.3 % — ABNORMAL LOW (ref 36.0–46.0)
Hemoglobin: 8.6 g/dL — ABNORMAL LOW (ref 12.0–15.0)
Immature Granulocytes: 0 %
Lymphocytes Relative: 4 %
Lymphs Abs: 0.5 10*3/uL — ABNORMAL LOW (ref 0.7–4.0)
MCH: 28.4 pg (ref 26.0–34.0)
MCHC: 29.4 g/dL — ABNORMAL LOW (ref 30.0–36.0)
MCV: 96.7 fL (ref 80.0–100.0)
Monocytes Absolute: 0.5 10*3/uL (ref 0.1–1.0)
Monocytes Relative: 5 %
Neutro Abs: 10 10*3/uL — ABNORMAL HIGH (ref 1.7–7.7)
Neutrophils Relative %: 90 %
Platelets: 225 10*3/uL (ref 150–400)
RBC: 3.03 MIL/uL — ABNORMAL LOW (ref 3.87–5.11)
RDW: 19.8 % — ABNORMAL HIGH (ref 11.5–15.5)
WBC: 11.1 10*3/uL — ABNORMAL HIGH (ref 4.0–10.5)
nRBC: 0 % (ref 0.0–0.2)

## 2023-03-26 LAB — RESP PANEL BY RT-PCR (RSV, FLU A&B, COVID)  RVPGX2
Influenza A by PCR: NEGATIVE
Influenza B by PCR: NEGATIVE
Resp Syncytial Virus by PCR: NEGATIVE
SARS Coronavirus 2 by RT PCR: NEGATIVE

## 2023-03-26 LAB — PROTIME-INR
INR: 1.1 (ref 0.8–1.2)
Prothrombin Time: 14.7 s (ref 11.4–15.2)

## 2023-03-26 LAB — LACTIC ACID, PLASMA
Lactic Acid, Venous: 2.5 mmol/L (ref 0.5–1.9)
Lactic Acid, Venous: 1.8 mmol/L (ref 0.5–1.9)

## 2023-03-26 LAB — COMPREHENSIVE METABOLIC PANEL
ALT: 56 U/L — ABNORMAL HIGH (ref 0–44)
AST: 36 U/L (ref 15–41)
Albumin: 2.7 g/dL — ABNORMAL LOW (ref 3.5–5.0)
Alkaline Phosphatase: 98 U/L (ref 38–126)
Anion gap: 10 (ref 5–15)
BUN: 19 mg/dL (ref 8–23)
CO2: 33 mmol/L — ABNORMAL HIGH (ref 22–32)
Calcium: 8.4 mg/dL — ABNORMAL LOW (ref 8.9–10.3)
Chloride: 96 mmol/L — ABNORMAL LOW (ref 98–111)
Creatinine, Ser: 2.15 mg/dL — ABNORMAL HIGH (ref 0.44–1.00)
GFR, Estimated: 22 mL/min — ABNORMAL LOW (ref >60)
Glucose, Bld: 228 mg/dL — ABNORMAL HIGH (ref 70–99)
Potassium: 3.7 mmol/L (ref 3.5–5.1)
Sodium: 139 mmol/L (ref 135–145)
Total Bilirubin: 0.4 mg/dL (ref 0.0–1.2)
Total Protein: 6.4 g/dL — ABNORMAL LOW (ref 6.5–8.1)

## 2023-03-26 LAB — GLUCOSE, CAPILLARY: Glucose-Capillary: 245 mg/dL — ABNORMAL HIGH (ref 70–99)

## 2023-03-26 LAB — APTT: aPTT: 32 s (ref 24–36)

## 2023-03-26 MED ORDER — ACETAMINOPHEN 325 MG PO TABS
650.0000 mg | ORAL_TABLET | Freq: Four times a day (QID) | ORAL | Status: DC | PRN
  Administered 2023-03-27 – 2023-03-28 (×2): 650 mg via ORAL
  Filled 2023-03-26 (×2): qty 2

## 2023-03-26 MED ORDER — VANCOMYCIN VARIABLE DOSE PER UNSTABLE RENAL FUNCTION (PHARMACIST DOSING): Status: DC

## 2023-03-26 MED ORDER — MELATONIN 3 MG PO TABS
3.0000 mg | ORAL_TABLET | Freq: Every evening | ORAL | Status: DC | PRN
  Administered 2023-03-26 – 2023-03-28 (×3): 3 mg via ORAL
  Filled 2023-03-26 (×3): qty 1

## 2023-03-26 MED ORDER — NALOXONE HCL 0.4 MG/ML IJ SOLN: 0.4000 mg | INTRAMUSCULAR | Status: DC | PRN

## 2023-03-26 MED ORDER — ONDANSETRON HCL 4 MG/2ML IJ SOLN: 4.0000 mg | Freq: Four times a day (QID) | INTRAMUSCULAR | Status: DC | PRN

## 2023-03-26 MED ORDER — VANCOMYCIN HCL IN DEXTROSE 1-5 GM/200ML-% IV SOLN
1000.0000 mg | Freq: Once | INTRAVENOUS | Status: AC
  Administered 2023-03-26: 1000 mg via INTRAVENOUS
  Filled 2023-03-26: qty 200

## 2023-03-26 MED ORDER — FENTANYL CITRATE PF 50 MCG/ML IJ SOSY
25.0000 ug | PREFILLED_SYRINGE | INTRAMUSCULAR | Status: DC | PRN
  Administered 2023-03-26 – 2023-03-28 (×3): 25 ug via INTRAVENOUS
  Filled 2023-03-26 (×3): qty 1

## 2023-03-26 MED ORDER — INSULIN ASPART 100 UNIT/ML IJ SOLN
0.0000 [IU] | Freq: Three times a day (TID) | INTRAMUSCULAR | Status: DC
  Administered 2023-03-27: 2 [IU] via SUBCUTANEOUS
  Administered 2023-03-27: 1 [IU] via SUBCUTANEOUS
  Administered 2023-03-28: 2 [IU] via SUBCUTANEOUS
  Administered 2023-03-28: 1 [IU] via SUBCUTANEOUS
  Administered 2023-03-29: 2 [IU] via SUBCUTANEOUS

## 2023-03-26 MED ORDER — PIPERACILLIN-TAZOBACTAM IN DEX 2-0.25 GM/50ML IV SOLN
2.2500 g | Freq: Three times a day (TID) | INTRAVENOUS | Status: DC
  Administered 2023-03-27: 2.25 g via INTRAVENOUS
  Filled 2023-03-26 (×3): qty 50

## 2023-03-26 MED ORDER — VANCOMYCIN HCL 500 MG IV SOLR
500.0000 mg | Freq: Once | INTRAVENOUS | Status: AC
  Administered 2023-03-27: 500 mg via INTRAVENOUS
  Filled 2023-03-26: qty 10

## 2023-03-26 MED ORDER — PIPERACILLIN-TAZOBACTAM IN DEX 2-0.25 GM/50ML IV SOLN
2.2500 g | Freq: Once | INTRAVENOUS | Status: AC
  Administered 2023-03-26: 2.25 g via INTRAVENOUS
  Filled 2023-03-26: qty 50

## 2023-03-26 MED ORDER — SODIUM CHLORIDE 0.9 % IV BOLUS
500.0000 mL | Freq: Once | INTRAVENOUS | Status: AC
  Administered 2023-03-26: 500 mL via INTRAVENOUS

## 2023-03-26 MED ORDER — ACETAMINOPHEN 650 MG RE SUPP: 650.0000 mg | Freq: Four times a day (QID) | RECTAL | Status: DC | PRN

## 2023-03-26 NOTE — H&P (Signed)
 History and Physical      Kim Burns FMW:[id number] DOB: [birth year] DOA: 03/26/2023; DOS: 03/26/2023  PCP: Waylan Almarie [name], MD  Patient coming from: home   I have personally briefly reviewed patient's old medical records in [facility]  Chief Complaint: Left foot redness  HPI: Kim Burns is a 88 y.o. female with medical history significant for intraductal papillary mucinous neoplasm of the pancreas status post Whipple procedure in 2018, type 2 diabetes mellitus, paroxysmal atrial fibrillation, chronic diastolic heart failure, end-stage renal disease on hemodialysis on Monday, Wednesday, Friday schedule, who is admitted to [facility] on 03/26/2023 by way of transfer from [facility] with cellulitis of the left foot after presenting from home to the latter facility complaining of left foot redness.  The patient reports approximately 2 weeks of erythema involving the dorsal surface of the second toe on the left foot associated with a small wound at that location.  She reports some associated increased swelling, and notes progression of the symptoms in spite of good compliance and completing an outpatient course of Keflex  for suspected cellulitis involving the left foot.  Denies any new rash or any other site.  Denies any acute focal weakness or any associated acute focal numbness or paresthesias.  No recent preceding trauma involving the left foot.  Over the last few days, she notes development of subjective fever and chills, in the absence of will by rigors or generalized myalgias.  She confirms a history of underlying diabetes, the diagnosis of which was made following Whipple procedure in 2018.  No recent shortness of breath, cough, headache, neck stiffness, abdominal pain.  While she has ESRD on hemodialysis on Monday, Wednesday, Friday schedule, she reports that she does continue to produce a small amount of urine, and denies any recent dysuria or gross  hematuria.     [facility] ED Course:  Vital signs in the ED were notable for the following: Temperature max 100.0; heart rates in the 90s to low 100s; systolic blood pressures in the low 100s to 140s; respiratory rate 15-23, oxygen saturation 96 to 100% on room air.  Labs were notable for the following: CMP was notable for the following: Potassium 3.7, glucose 228, calcium  adjusted for mild hypoalbuminemia 9.4, avidin 2.7, ALT 56; otherwise, liver enzymes are within normal limits.  Initial lactate 2.5, with repeat value trending down to 1.8.  CBC notable for white blood cell count 11,100 with 90% neutrophils, hemoglobin 8.6 associated with normocytic finding, platelet count 225.  INR 1.1, PTT 32.  Urinalysis showed 11-20 white blood cells, 21-50 squamous epithelial cells, leukocyte esterase/nitrate negative, and showed no bacteria.  Blood cultures x 2 were collected prior to initiation of IV antibiotics, COVID, influenza, RSV PCR were all negative.  Per my interpretation, EKG in ED demonstrated the following: Interpretation of presenting EKG was limited by the presence of some motion artifact; however, within these confines, appears to show atrial fibrillation with heart rate 108, and no evidence of T wave or ST changes, including no evidence of ST elevation.  Imaging in the ED, per corresponding formal radiology read, was notable for the following: Plain film of the left foot shows no evidence of acute osteomyelitis or any evidence of acute fracture or foreign body.  1 view chest x-ray showed no evidence of acute cardiopulmonary process, including no evidence of infiltrate, edema, effusion, or pneumothorax.  While in the ED, the following were administered: IV vancomycin , Zosyn , normal saline x 500 cc bolus.  Subsequently, the patient was admitted to [facility] for further evaluation management of suspected left foot cellulitis.     Review of Systems: As per HPI otherwise 10 point review of  systems negative.   Past Medical History:  Diagnosis Date   Anemia of chronic disease    takes iron   Anxiety    Atrial fibrillation (HCC)    Blood transfusion without reported diagnosis    Bradycardia    Diabetes mellitus without complication (HCC)    became diabetic after Whipple procedure   Diarrhea    Dysrhythmia 04/16/2016   bradycardia due to medication    ESRD on hemodialysis [facility])    M-W-F   GERD (gastroesophageal reflux disease)    Gout    Headache    History of kidney stones 06/2013   Hyperparathyroidism (HCC)    Hypertension    PONV (postoperative nausea and vomiting)    Sleep apnea    no cpap machine. could not tolerate   Stroke (HCC) 04/16/2016   TIA 1995   Vitamin D  deficiency     Past Surgical History:  Procedure Laterality Date   A/V FISTULAGRAM Left 08/30/2021   Procedure: A/V Fistulagram;  Surgeon: Gretta Lonni PARAS, MD;  Location: [facility] INVASIVE CV LAB;  Service: Cardiovascular;  Laterality: Left;   A/V FISTULAGRAM N/A 02/25/2023   Procedure: A/V Fistulagram;  Surgeon: Sheree Penne Lonni, MD;  Location: [facility] INVASIVE CV LAB;  Service: Vascular;  Laterality: N/A;   ABDOMINAL HYSTERECTOMY  1985   complete   BACK SURGERY  1980   lower   BASCILIC VEIN TRANSPOSITION Left 04/24/2017   Procedure: LEFT ARM FIRST STAGE BASILIC VEIN TRANSPOSITION;  Surgeon: Serene Gaile ORN, MD;  Location: [facility] OR;  Service: Vascular;  Laterality: Left;   BASCILIC VEIN TRANSPOSITION Left 07/10/2017   Procedure: SECOND STAGE BASILIC VEIN TRANSPOSITION LEFT ARM;  Surgeon: Serene Gaile ORN, MD;  Location: [facility] OR;  Service: Vascular;  Laterality: Left;   BREAST LUMPECTOMY Left x 2   many years apart, benign   CHOLECYSTECTOMY  1985   CYSTOSCOPY W/ URETERAL STENT PLACEMENT Left 10/31/2021   Procedure: CYSTOSCOPY WITH RETROGRADE PYELOGRAM/URETERAL STENT PLACEMENT;  Surgeon: Cam Morene ORN, MD;  Location: [facility] OR;  Service: Urology;  Laterality: Left;   CYSTOSCOPY WITH  URETEROSCOPY AND STENT PLACEMENT Left 06/18/2013   Procedure: CYSTOSCOPY WITH Lef URETEROSCOPY AND Left STENT PLACEMENT;  Surgeon: Noretta Ferrara, MD;  Location: [facility] ORS;  Service: Urology;  Laterality: Left;   DIALYSIS/PERMA CATHETER INSERTION N/A 03/13/2023   Procedure: DIALYSIS/PERMA CATHETER INSERTION;  Surgeon: Tobie Gordy POUR, MD;  Location: [facility] INVASIVE CV LAB;  Service: Cardiovascular;  Laterality: N/A;   ESOPHAGOGASTRODUODENOSCOPY (EGD) WITH PROPOFOL  N/A 11/13/2016   Procedure: ESOPHAGOGASTRODUODENOSCOPY (EGD) WITH PROPOFOL ;  Surgeon: Elicia Claw, MD;  Location: [facility] ENDOSCOPY;  Service: Gastroenterology;  Laterality: N/A;   EUS N/A 02/07/2016   Procedure: ESOPHAGEAL ENDOSCOPIC ULTRASOUND (EUS) RADIAL;  Surgeon: Elsie Cree, MD;  Location: [facility] ENDOSCOPY;  Service: Endoscopy;  Laterality: N/A;   EYE SURGERY Bilateral 2014   ioc for catracts    FOOT SURGERY Left 1990   something with toes unsure what    HERNIA REPAIR  2018   IR CATHETER TUBE CHANGE  03/14/2022   IR EMBO TUMOR ORGAN ISCHEMIA INFARCT INC GUIDE ROADMAPPING  08/22/2021   IR NEPHROSTOMY EXCHANGE LEFT  06/27/2022   IR NEPHROSTOMY EXCHANGE LEFT  08/26/2022   IR NEPHROSTOMY EXCHANGE LEFT  10/22/2022   IR NEPHROSTOMY EXCHANGE LEFT  12/16/2022  IR NEPHROSTOMY EXCHANGE LEFT  12/30/2022   IR NEPHROSTOMY EXCHANGE LEFT  02/10/2023   IR NEPHROSTOMY PLACEMENT LEFT  03/14/2022   IR RADIOLOGIST EVAL & MGMT  05/18/2021   IR RADIOLOGIST EVAL & MGMT  10/01/2021   IR RADIOLOGIST EVAL & MGMT  11/12/2021   IR RADIOLOGIST EVAL & MGMT  11/22/2021   IR RADIOLOGIST EVAL & MGMT  11/27/2021   IR RADIOLOGIST EVAL & MGMT  12/20/2021   IR RENAL SUPRASEL UNI S&I MOD SED  08/22/2021   IR US  GUIDE VASC ACCESS RIGHT  08/22/2021   LEFT HEART CATH AND CORONARY ANGIOGRAPHY N/A 08/27/2021   Procedure: LEFT HEART CATH AND CORONARY ANGIOGRAPHY;  Surgeon: Verlin Lonni BIRCH, MD;  Location: [facility] INVASIVE CV LAB;  Service: Cardiovascular;  Laterality: N/A;    PARATHYROID  EXPLORATION     PERIPHERAL VASCULAR BALLOON ANGIOPLASTY Left 08/30/2021   Procedure: PERIPHERAL VASCULAR BALLOON ANGIOPLASTY;  Surgeon: Gretta Lonni PARAS, MD;  Location: [facility] INVASIVE CV LAB;  Service: Cardiovascular;  Laterality: Left;  arm fistula   PERIPHERAL VASCULAR BALLOON ANGIOPLASTY Left 02/25/2023   Procedure: PERIPHERAL VASCULAR BALLOON ANGIOPLASTY;  Surgeon: Sheree Penne Lonni, MD;  Location: [facility] INVASIVE CV LAB;  Service: Vascular;  Laterality: Left;  left AVF   RADIOLOGY WITH ANESTHESIA Left 08/22/2021   Procedure: MICROWAVE ABLATION;  Surgeon: Adele Rush, MD;  Location: [facility] ORS;  Service: Anesthesiology;  Laterality: Left;   WHIPPLE PROCEDURE N/A 04/23/2016   Procedure: WHIPPLE PROCEDURE;  Surgeon: Jina Nephew, MD;  Location: [facility] OR;  Service: General;  Laterality: N/A;    Social History:  reports that she has never smoked. She has never used smokeless tobacco. She reports that she does not drink alcohol  and does not use drugs.   Allergies  Allergen Reactions   Lopid [Gemfibrozil] Other (See Comments)    Myalgia    Lotemax [Loteprednol Etabonate] Rash    Skin burning  Blurry vision   Statins Other (See Comments)    Rosuvastatin Muscle weakness   Norco [Hydrocodone -Acetaminophen ] Other (See Comments)    Headaches  Tolerates acetaminophen     Other Diarrhea    Real Butter - diarrhea   Vibra -Tab [Doxycycline ] Other (See Comments)    Unknown reaction   Amlodipine Other (See Comments)    Norvasc   Codeine Other (See Comments)    headache   Hydrocodone  Other (See Comments)   Hydrocodone -Acetaminophen  Other (See Comments)    Headache. Tolerates acetaminophen .   Lisinopril Other (See Comments)   Verapamil  Other (See Comments)    Family History  Problem Relation Age of Onset   Heart disease Mother    Stroke Mother    Cancer Father        colon   Alzheimer's disease Sister    Alzheimer's disease Sister    Cancer Brother        brain   Breast  cancer Neg Hx     Family history reviewed and not pertinent    Prior to Admission medications   Medication Sig Start Date End Date Taking? Authorizing Provider  acetaminophen  (TYLENOL ) 325 MG tablet Take 2 tablets (650 mg total) by mouth every 6 (six) hours as needed for mild pain (pain score 1-3), moderate pain (pain score 4-6), fever or headache. 01/02/23  Yes Von Bellis, MD  amiodarone  (PACERONE ) 200 MG tablet Take 1 tablet (200 mg total) by mouth daily. 11/15/22  Yes Love, Sharlet RAMAN, PA-C  aspirin  81 MG chewable tablet Chew 1 tablet (81 mg total) by mouth daily. Patient  taking differently: Chew 81 mg by mouth daily. Take 81mg  by mouth every morning except on dialysis days, hold in the morning and take after dialysis. 11/13/22  Yes Love, Sharlet RAMAN, PA-C  B Complex-C-Zn-Folic Acid  (DIALYVITE 800 WITH ZINC ) 0.8 MG TABS Take 1 tablet by mouth daily.   Yes [provider]  cycloSPORINE  (RESTASIS ) 0.05 % ophthalmic emulsion Place 1 drop into both eyes daily as needed (irritation). Patient taking differently: Place 1 drop into both eyes daily as needed (dryness). 11/13/22  Yes Love, Sharlet RAMAN, PA-C  diphenhydramine -acetaminophen  (TYLENOL  PM) 25-500 MG TABS tablet Take 2 tablets by mouth at bedtime.   Yes [provider]  hydrALAZINE  (APRESOLINE ) 50 MG tablet Take 50 mg by mouth every 8 (eight) hours as needed (for BP greater than 160).   Yes [provider]  lidocaine -prilocaine  (EMLA ) cream Apply 1 Application topically every Monday, Wednesday, and Friday with hemodialysis.   Yes [provider]  lipase/protease/amylase (CREON ) 36000 UNITS CPEP capsule Take 36,000 Units by mouth See admin instructions. Take 36,000 units (1 capsule) by mouth with meals and snacks.   Yes [provider]  metoprolol  succinate (TOPROL -XL) 25 MG 24 hr tablet Take 1 tablet (25 mg total) by mouth 3 (three) times a week. Take on nondialysis days, Tuesday, Thursday, Saturday and  Sunday if systolic BP greater than 130 mmHg 01/03/23  Yes Von Bellis, MD  midodrine  (PROAMATINE ) 10 MG tablet Take 1 tablet (10 mg total) by mouth See admin instructions. Take 10mg  by mouth before dialysis. Bring with you to dialysis in case another dose is required. May take 10 mg 3 times a day if systolic BP less than 120 mmHg 01/02/23 01/02/24 Yes Von Bellis, MD  olopatadine  (PATADAY ) 0.1 % ophthalmic solution Place 1 drop into both eyes 2 (two) times daily as needed for allergies.   Yes [provider]  rOPINIRole  (REQUIP ) 0.25 MG tablet Take 1 tablet (0.25 mg total) by mouth 2 (two) times daily. Patient taking differently: Take 0.25-0.5 mg by mouth See admin instructions. Take 0.5mg  (1 tablet) by mouth every night at bedtime and 0.25mg  (1/2 tablet) during the day as needed. 09/24/22  Yes Sebastian Toribio GAILS, MD  sevelamer  carbonate (RENVELA ) 800 MG tablet Take 1 tablet (800 mg total) by mouth 3 (three) times daily with meals. Patient taking differently: Take 800 mg by mouth See admin instructions. Take 800mg  by mouth with meals or snacks. 11/13/22  Yes Love, Sharlet RAMAN, PA-C  amantadine  (SYMMETREL ) 100 MG capsule Take 1 capsule (100 mg total) by mouth once a week. 11/20/22   Love, Sharlet RAMAN, PA-C  Azelastine  HCl 137 MCG/SPRAY SOLN SMARTSIG:1-2 Spray(s) Both Nares 1-2 Times Daily 11/29/22   [provider]  cephALEXin  (KEFLEX ) 250 MG capsule Take 250 mg by mouth every 12 (twelve) hours. 01/07/23   [provider]  cephALEXin  (KEFLEX ) 500 MG capsule Take 500 mg by mouth 4 (four) times daily. 03/08/23   [provider]  Difelikefalin Acetate (KORSUVA) 65 MCG/1.3ML SOLN Korsuva (difelikefalin) IVP Dose 01/29/23 04/21/23  [provider]  insulin  aspart (NOVOLOG  FLEXPEN) 100 UNIT/ML FlexPen Inject 5-7 Units into the skin See admin instructions. Use 5 units if blood sugar over 200. Use 7 units if blood sugar over 250.    [provider]  meclizine   (ANTIVERT ) 25 MG tablet Take 1 tablet (25 mg total) by mouth 3 (three) times daily as needed for dizziness. 01/02/23 01/02/24  Von Bellis, MD     Objective  Physical Exam: Vitals:   03/26/23 1745 03/26/23 1935 03/26/23 2000 03/26/23 2151  BP: 120/86 123/74 103/82 (!) 145/81  Pulse: 99 (!) 112 (!) 101 97  Resp: 16 (!) 22 (!) 21 18  Temp:  98.4 F (36.9 C)    TempSrc:  Oral    SpO2: 99% 96% 99% 100%    General: appears to be stated age; alert, oriented Skin: warm, dry, small wound on the left second toe associate with erythema, mild swelling; no evidence of crepitus Head:  AT/[place] Mouth:  Oral mucosa membranes appear moist, normal dentition Neck: supple; trachea midline Heart:  irregular; did not appreciate any M/R/G Lungs: CTAB, did not appreciate any wheezes, rales, or rhonchi Abdomen: + BS; soft, ND, NT Vascular: 2+ pedal pulses b/l; 2+ radial pulses b/l Extremities: Left foot erythema, swelling, as further detailed above no muscle wasting Neuro: strength and sensation intact in upper and lower extremities b/l     Labs on Admission: I have personally reviewed following labs and imaging studies  CBC: Recent Labs  Lab 03/26/23 1519  WBC 11.1*  NEUTROABS 10.0*  HGB 8.6*  HCT 29.3*  MCV 96.7  PLT 225   Basic Metabolic Panel: Recent Labs  Lab 03/26/23 1519  NA 139  K 3.7  CL 96*  CO2 33*  GLUCOSE 228*  BUN 19  CREATININE 2.15*  CALCIUM  8.4*   GFR: Estimated Creatinine Clearance: 15.8 mL/min (A) (by C-G formula based on SCr of 2.15 mg/dL (H)). Liver Function Tests: Recent Labs  Lab 03/26/23 1519  AST 36  ALT 56*  ALKPHOS 98  BILITOT 0.4  PROT 6.4*  ALBUMIN  2.7*   No results for input(s): LIPASE, AMYLASE in the last 168 hours. No results for input(s): AMMONIA in the last 168 hours. Coagulation Profile: Recent Labs  Lab 03/26/23 1519  INR 1.1   Cardiac Enzymes: No results for input(s): CKTOTAL, CKMB, CKMBINDEX, TROPONINI in  the last 168 hours. BNP (last 3 results) No results for input(s): PROBNP in the last 8760 hours. HbA1C: No results for input(s): HGBA1C in the last 72 hours. CBG: No results for input(s): GLUCAP in the last 168 hours. Lipid Profile: No results for input(s): CHOL, HDL, LDLCALC, TRIG, CHOLHDL, LDLDIRECT in the last 72 hours. Thyroid  Function Tests: No results for input(s): TSH, T4TOTAL, FREET4, T3FREE, THYROIDAB in the last 72 hours. Anemia Panel: No results for input(s): VITAMINB12, FOLATE, FERRITIN, TIBC, IRON, RETICCTPCT in the last 72 hours. Urine analysis:    Component Value Date/Time   COLORURINE YELLOW 03/26/2023 1810   APPEARANCEUR HAZY (A) 03/26/2023 1810   LABSPEC 1.011 03/26/2023 1810   PHURINE 8.0 03/26/2023 1810   GLUCOSEU 100 (A) 03/26/2023 1810   HGBUR TRACE (A) 03/26/2023 1810   BILIRUBINUR NEGATIVE 03/26/2023 1810   KETONESUR NEGATIVE 03/26/2023 1810   PROTEINUR 100 (A) 03/26/2023 1810   UROBILINOGEN 1.0 06/18/2013 1633   NITRITE NEGATIVE 03/26/2023 1810   LEUKOCYTESUR NEGATIVE 03/26/2023 1810    Radiological Exams on Admission: DG Foot Complete Left Result Date: 03/26/2023 CLINICAL DATA:  wound to 2nd toe. EXAM: LEFT FOOT - COMPLETE 3+ VIEW COMPARISON:  04/19/2021. FINDINGS: No acute fracture or dislocation. Please note evaluation of the distal phalanx of second toe is limited due to overlapping with the adjacent toes. However, no discrete focal bone destruction/erosion noted. Mild-to-moderate hallux valgus deformity noted. Metallic K-wire/screws noted overlying the distal portion of the first and second metatarsals, similar to the prior study. Ankle mortise appears intact. No focal soft tissue swelling.  No evidence of air within soft tissue. No radiopaque foreign bodies. IMPRESSION: No radiographic evidence of acute osteomyelitis involving the second toe. Correlate clinically to determine the need for additional imaging with MRI.  Electronically Signed   By: Ree Molt M.D.   On: 03/26/2023 16:07   DG Chest Port 1 View Result Date: 03/26/2023 CLINICAL DATA:  Questionable sepsis - evaluate for abnormality. Fever. Headache. EXAM: PORTABLE CHEST 1 VIEW COMPARISON:  01/10/2023. FINDINGS: Low lung volume. There are probable atelectatic changes in the left lung base. Bilateral lung fields are otherwise clear. No dense consolidation or lung collapse. No pulmonary edema. Bilateral costophrenic angles are clear. Stable cardio-mediastinal silhouette. No acute osseous abnormalities. The soft tissues are within normal limits. Right-sided central venous catheter noted with its tip overlying the right atrium region. There is an additional tubing overlying the left upper abdomen. IMPRESSION: No active disease. Electronically Signed   By: Ree Molt M.D.   On: 03/26/2023 16:03      Assessment/Plan   Principal Problem:   Cellulitis of left foot Active Problems:   ESRD on dialysis (HCC)   Lactic acidosis   Paroxysmal atrial fibrillation (HCC)   IPMN (intraductal papillary mucinous neoplasm)   DM2 (diabetes mellitus, type 2) (HCC)   Chronic diastolic CHF (congestive heart failure) (HCC)   History of anemia due to chronic kidney disease   Sterile pyuria     #) Cellulitis of the left foot: Presentation concerning for cellulitis of the left foot in the setting of proximately 2 weeks of progressive left second toe erythema, increased swelling, associated with development of subjective fever and chills, with left shift observed on today CBC with differential, and presenting plain film that shows no overt evidence of acute osteomyelitis nor any evidence of subcutaneous gas.  In the absence of subcutaneous gas in the absence of crepitus on exam, presentation appears less suggestive of necrotizing fasciitis.  Presentation appears to be consistent with failure of outpatient oral antibiotics, given progression in the above symptoms in  spite of compliance with course of Keflex  at home.  Of note, while the patient has a mildly elevated temperature of 100.0, quantitative threshold for fever and quantitative threshold for leukocytosis are not met at this time.  Consequently, SIRS criteria for sepsis are not currently met.  She appears hemodynamically stable.  Blood cultures x 2 were collected this evening followed by initiation of IV vancomycin  and Zosyn .  Given proximity to wound on the second toe, will anaerobic coverage given concern for increased risk of infected diabetic foot wound.  Additionally, given the above presentation, will further evaluate for underlying acute osteomyelitis via MRI of the left foot, and continue IV vancomycin  and antipseudomonal coverage pending this result.   Plan: Follow-up results of blood cultures x 2.  Continue IV vancomycin  and Zosyn  for now, as above.  Repeat CBC with differential in the morning.  MRI of the left foot, as above.  Check CRP and ESR.  As needed acetaminophen  for fever.  As needed IV fentanyl  for pain.                    #) Lactic Acidosis: Initial lactate of 2.5, with interval decline to 1.8 following interval administration of 500 cc NS bolus.  As noted above, in the absence of objective fever and in the absence of will with cell count greater than 12,000 or less than 4000, SIRS criteria are not met for sepsis at this time.  However, contributing factors  leading to her initial lactic acidosis include her underlying infection of the form of left foot cellulitis superimposed on end-stage renal disease on hemodialysis.  Of note, INR and PTT appear within normal limits, suggestive of preservation of hepatic synthetic function.  No evidence of additional underlying infectious process at this time  Plan: Repeat CBC in the morning.  Further evaluation management of presenting left foot cellulitis, as above.                  #) Asymptomatic Pyuria: Presenting  urinalysis suggestive of pyuria but does not meet quantitative threshold for significance in a female, particularly when taking into account the presence of squamous epithelial cells suggestive of an element of contamination, likely contributing to a false elevation of her pyuria.  Additionally, no acute urinary symptoms.  Overall, these findings appear most consistent with asymptomatic and insignificant pyuria, and do not appear to warrant independent antibiotic coverage for such.  Plan: Repeat CBC in the morning.  Add on urine culture.                  #) Type 2 Diabetes Mellitus: documented history of such. Home insulin  regimen: Sliding scale NovoLog  3 times daily with meals. Home oral hypoglycemic agents: None. presenting blood sugar: 228. Most recent A1c noted to be 6.1% when checked in September 2024.  Plan: accuchecks QAC and HS with low dose SSI.                    #) Paroxysmal atrial fibrillation: Documented history of such. In setting of CHA2DS2-VASc score of  5, there is an indication for chronic anticoagulation for thromboembolic prophylaxis.  However, it does not appear that she is on formal anticoagulation as an outpatient.  Rather, she is on a daily baby aspirin  at home.  Will attempt additional chart review to ascertain the underlying rationale behind the absence of formal anticoagulation, with initial chart review revealing a history of acute gastrointestinal bleed in September 2023, which may be playing a role in decision making regarding the absence of formal anticoagulation at home.  Home AV nodal blocking regimen: Metoprolol  succinate, which she takes on Tuesday, Thursday, Saturday and Sunday, associated with her nausea on hemodialysis days.  Most recent echocardiogram was performed in December 2024, and notable for LVEF 6065%, no evidence of focal wall motion O'Malley's, grade 3 diastolic dysfunction, low normal right ventricular systolic function,  moderately dilated left atrium, mildly dilated right atrium, as well as mild mitral regurgitation. Presenting EKG suggestive of rate controlled atrial fibrillation, in the absence of any evidence of acute ischemic changes.  Plan: monitor strict I's & O's and daily weights. CMP/CBC in AM. Check serum mag level. Continue home AV nodal blocking regimen.  Continue daily baby aspirin  for now.  Monitor on telemetry.                   #) Chronic diastolic heart failure: documented history of such, with most recent echocardiogram performed in December 2024, which is notable for LVEF 60 to 65% as well as grade 3 diastolic dysfunction, with additional results as conveyed above. No clinical or radiographic evidence to suggest acutely decompensated heart failure at this time. home diuretic regimen reportedly consists of the following: None in the setting of her end-stage renal disease on hemodialysis.   Plan: monitor strict I's & O's and daily weights. Repeat CMP in AM. Check serum mag level.                    #)  ESRD: on HD (schedule:  M,W,F). Next due for routine HD on  Friday, 03/28/23. No clinical evidence for urgent overnight HD or to expedite HD relative to this timeframe.  On Renvela  as an outpatient.  Would need subsequent consultation of nephrology should the patient remain in the hospital leading up to her next routine hemodialysis.  Plan: monitor strict I's/O's, daily weights. CMP in the AM. Check mag and phos levels.  Resume home Renvela .                   #) History of intraductal papillary mucinous neoplasm: Documented history of such, status post Whipple procedure in 2018. On Creon  as an outpatient.  Plan: Continue outpatient Creon .                  #) Anemia of chronic kidney disease: Documented history of such, a/w with baseline hgb range 7-11, with presenting hgb consistent with this range, in the absence of any overt evidence of  active bleed.     Plan: Repeat CBC in the morning.     DVT prophylaxis: SCD's   Code Status: Full code Family Communication: none Disposition Plan: Per Rounding Team Consults called: none;  Admission status: inpatient     I SPENT GREATER THAN 75  MINUTES IN CLINICAL CARE TIME/MEDICAL DECISION-MAKING IN COMPLETING THIS ADMISSION.      Sparkle Aube B Adelie Croswell DO [facility]  From 7PM - 7AM   03/26/2023, 10:26 PM

## 2023-03-26 NOTE — Progress Notes (Signed)
 ED Pharmacy Antibiotic Sign Off An antibiotic consult was received from an ED provider for vancomycin  and zosyn  per pharmacy dosing for sepsis. A chart review was completed to assess appropriateness.   The following one time order(s) were placed per pharmacy consult:  vancomycin  1500 mg x 1 dose zosyn  2.25 mg x 1 dose  Further antibiotic and/or antibiotic pharmacy consults should be ordered by the admitting provider if indicated.   Thank you for allowing pharmacy to be a part of this patient's care.   Nidia Schaffer, PharmD PGY1 Pharmacy Resident  Please check AMION for all [facility] Pharm[facility] phone numbers After 10:00 PM, call Main Pharmacy [phone]

## 2023-03-26 NOTE — ED Provider Triage Note (Signed)
 Emergency Medicine Provider Triage Evaluation Note  GEETIKA LABORDE , a 88 y.o. female  was evaluated in triage.  Pt complains of concern for wound infection.  Were sent to the emergency department by PCP.  States the wound has improved since taking the antibiotics.  Outpatient antibiotic course completed..  Review of Systems  Positive: As above Negative: As above  Physical Exam  BP 134/87 (BP Location: Right Arm)   Pulse (!) 107   Temp 100 F (37.8 C)   Resp 16   SpO2 98%  Gen:   Awake, no distress   Resp:  Normal effort  MSK:   Moves extremities without difficulty  Other:    Medical Decision Making  Medically screening exam initiated at 2:37 PM.  Appropriate orders placed.  MARSHAY SLATES was informed that the remainder of the evaluation will be completed by another provider, this initial triage assessment does not replace that evaluation, and the importance of remaining in the ED until their evaluation is complete.  Sepsis labs ordered.  Code sepsis not activated from triage.  X-ray of the left foot ordered.  Will defer fluids, antibiotics to the provider that sees the patient in the room.   Hildegard Loge, PA-C 03/26/23 1438

## 2023-03-26 NOTE — ED Provider Notes (Signed)
 [place] EMERGENCY DEPARTMENT AT [facility] Provider Note   CSN: [id number] Arrival date & time: 03/26/23  1359     History  Chief Complaint  Patient presents with   Fever    Kim Burns is a 88 y.o. female w/ hx of CKD on dialysis, left toe infection, presenting from home with fevers, fatigue, weakness, muscle aches, runny nose, persistent cough worsening in past 24-48 hours. Her son at bedside explains the patient developed a left 2nd toe infection in the past month and has completed 10 days of antibiotics by her PCP (6 days ago), with improvement of the redness around her toe and foot.  However the fever has been new. PCP wanted her to come to Ed for further evaluation.  Dialysis MWF, completed today  Hx of A Fib only on aspirin , bleeding risk  HPI     Home Medications Prior to Admission medications   Medication Sig Start Date End Date Taking? Authorizing Provider  acetaminophen  (TYLENOL ) 325 MG tablet Take 2 tablets (650 mg total) by mouth every 6 (six) hours as needed for mild pain (pain score 1-3), moderate pain (pain score 4-6), fever or headache. 01/02/23  Yes Von Bellis, MD  amiodarone  (PACERONE ) 200 MG tablet Take 1 tablet (200 mg total) by mouth daily. 11/15/22  Yes Love, Sharlet RAMAN, PA-C  aspirin  81 MG chewable tablet Chew 1 tablet (81 mg total) by mouth daily. Patient taking differently: Chew 81 mg by mouth daily. Take 81mg  by mouth every morning except on dialysis days, hold in the morning and take after dialysis. 11/13/22  Yes Love, Sharlet RAMAN, PA-C  B Complex-C-Zn-Folic Acid  (DIALYVITE 800 WITH ZINC ) 0.8 MG TABS Take 1 tablet by mouth daily.   Yes [provider]  cycloSPORINE  (RESTASIS ) 0.05 % ophthalmic emulsion Place 1 drop into both eyes daily as needed (irritation). Patient taking differently: Place 1 drop into both eyes daily as needed (dryness). 11/13/22  Yes Love, Sharlet RAMAN, PA-C  diphenhydramine -acetaminophen  (TYLENOL  PM) 25-500 MG TABS  tablet Take 2 tablets by mouth at bedtime.   Yes [provider]  hydrALAZINE  (APRESOLINE ) 50 MG tablet Take 50 mg by mouth every 8 (eight) hours as needed (for BP greater than 160).   Yes [provider]  lidocaine -prilocaine  (EMLA ) cream Apply 1 Application topically every Monday, Wednesday, and Friday with hemodialysis.   Yes [provider]  lipase/protease/amylase (CREON ) 36000 UNITS CPEP capsule Take 36,000 Units by mouth See admin instructions. Take 36,000 units (1 capsule) by mouth with meals and snacks.   Yes [provider]  metoprolol  succinate (TOPROL -XL) 25 MG 24 hr tablet Take 1 tablet (25 mg total) by mouth 3 (three) times a week. Take on nondialysis days, Tuesday, Thursday, Saturday and Sunday if systolic BP greater than 130 mmHg 01/03/23  Yes Von Bellis, MD  midodrine  (PROAMATINE ) 10 MG tablet Take 1 tablet (10 mg total) by mouth See admin instructions. Take 10mg  by mouth before dialysis. Bring with you to dialysis in case another dose is required. May take 10 mg 3 times a day if systolic BP less than 120 mmHg 01/02/23 01/02/24 Yes Von Bellis, MD  olopatadine  (PATADAY ) 0.1 % ophthalmic solution Place 1 drop into both eyes 2 (two) times daily as needed for allergies.   Yes [provider]  rOPINIRole  (REQUIP ) 0.25 MG tablet Take 1 tablet (0.25 mg total) by mouth 2 (two) times daily. Patient taking differently: Take 0.25-0.5 mg by mouth See admin instructions. Take 0.5mg  (1  tablet) by mouth every night at bedtime and 0.25mg  (1/2 tablet) during the day as needed. 09/24/22  Yes Sebastian Toribio GAILS, MD  sevelamer  carbonate (RENVELA ) 800 MG tablet Take 1 tablet (800 mg total) by mouth 3 (three) times daily with meals. Patient taking differently: Take 800 mg by mouth See admin instructions. Take 800mg  by mouth with meals or snacks. 11/13/22  Yes Love, Sharlet RAMAN, PA-C  amantadine  (SYMMETREL ) 100 MG capsule Take 1 capsule (100 mg total) by mouth once  a week. 11/20/22   Love, Sharlet RAMAN, PA-C  Azelastine  HCl 137 MCG/SPRAY SOLN SMARTSIG:1-2 Spray(s) Both Nares 1-2 Times Daily 11/29/22   [provider]  cephALEXin  (KEFLEX ) 250 MG capsule Take 250 mg by mouth every 12 (twelve) hours. 01/07/23   [provider]  cephALEXin  (KEFLEX ) 500 MG capsule Take 500 mg by mouth 4 (four) times daily. 03/08/23   [provider]  Difelikefalin Acetate (KORSUVA) 65 MCG/1.3ML SOLN Korsuva (difelikefalin) IVP Dose 01/29/23 04/21/23  [provider]  insulin  aspart (NOVOLOG  FLEXPEN) 100 UNIT/ML FlexPen Inject 5-7 Units into the skin See admin instructions. Use 5 units if blood sugar over 200. Use 7 units if blood sugar over 250.    [provider]  meclizine  (ANTIVERT ) 25 MG tablet Take 1 tablet (25 mg total) by mouth 3 (three) times daily as needed for dizziness. 01/02/23 01/02/24  Von Bellis, MD      Allergies    Lopid [gemfibrozil], Lotemax [loteprednol etabonate], Statins, Norco [hydrocodone -acetaminophen ], Other, Vibra -tab [doxycycline ], Amlodipine, Codeine, Hydrocodone , Hydrocodone -acetaminophen , Lisinopril, and Verapamil     Review of Systems   Review of Systems  Physical Exam Updated Vital Signs BP (!) 145/81 (BP Location: Right Arm)   Pulse 97   Temp 98.4 F (36.9 C) (Oral)   Resp 18   Ht 5' 3 (1.6 m)   Wt 55.3 kg   SpO2 100%   BMI 21.61 kg/m  Physical Exam Constitutional:      General: She is not in acute distress. HENT:     Head: Normocephalic and atraumatic.  Eyes:     Conjunctiva/sclera: Conjunctivae normal.     Pupils: Pupils are equal, round, and reactive to light.  Cardiovascular:     Rate and Rhythm: Normal rate. Rhythm irregular.  Pulmonary:     Effort: Pulmonary effort is normal. No respiratory distress.  Abdominal:     General: There is no distension.     Tenderness: There is no abdominal tenderness.  Musculoskeletal:     Comments: Ulceration with mild surrounding erythema on  dorsum of left 2nd toe, does not extend to midfoot  Skin:    General: Skin is warm and dry.  Neurological:     General: No focal deficit present.     Mental Status: She is alert. Mental status is at baseline.  Psychiatric:        Mood and Affect: Mood normal.        Behavior: Behavior normal.     ED Results / Procedures / Treatments   Labs (all labs ordered are listed, but only abnormal results are displayed) Labs Reviewed  LACTIC ACID, PLASMA - Abnormal; Notable for the following components:      Result Value   Lactic Acid, Venous 2.5 (*)    All other components within normal limits  COMPREHENSIVE METABOLIC PANEL - Abnormal; Notable for the following components:   Chloride 96 (*)    CO2 33 (*)    Glucose, Bld 228 (*)    Creatinine,  Ser 2.15 (*)    Calcium  8.4 (*)    Total Protein 6.4 (*)    Albumin  2.7 (*)    ALT 56 (*)    GFR, Estimated 22 (*)    All other components within normal limits  CBC WITH DIFFERENTIAL/PLATELET - Abnormal; Notable for the following components:   WBC 11.1 (*)    RBC 3.03 (*)    Hemoglobin 8.6 (*)    HCT 29.3 (*)    MCHC 29.4 (*)    RDW 19.8 (*)    Neutro Abs 10.0 (*)    Lymphs Abs 0.5 (*)    All other components within normal limits  URINALYSIS, W/ REFLEX TO CULTURE (INFECTION SUSPECTED) - Abnormal; Notable for the following components:   APPearance HAZY (*)    Glucose, UA 100 (*)    Hgb urine dipstick TRACE (*)    Protein, ur 100 (*)    All other components within normal limits  GLUCOSE, CAPILLARY - Abnormal; Notable for the following components:   Glucose-Capillary 245 (*)    All other components within normal limits  RESP PANEL BY RT-PCR (RSV, FLU A&B, COVID)  RVPGX2  CULTURE, BLOOD (ROUTINE X 2)  CULTURE, BLOOD (ROUTINE X 2)  CULTURE, OB URINE  LACTIC ACID, PLASMA  PROTIME-INR  APTT  CBC WITH DIFFERENTIAL/PLATELET  COMPREHENSIVE METABOLIC PANEL  MAGNESIUM   PHOSPHORUS  HEMOGLOBIN A1C  C-REACTIVE PROTEIN  SEDIMENTATION RATE     EKG EKG Interpretation Date/Time:  Wednesday March 26 2023 18:27:44 EST Ventricular Rate:  108 PR Interval:    QRS Duration:  84 QT Interval:  343 QTC Calculation: 460 R Axis:   15  Text Interpretation: Atrial fibrillation Borderline low voltage, extremity leads Consider anterior infarct Borderline repolarization abnormality Confirmed by Cottie Cough [phone]) on 03/26/2023 6:44:54 PM  Radiology DG Foot Complete Left Result Date: 03/26/2023 CLINICAL DATA:  wound to 2nd toe. EXAM: LEFT FOOT - COMPLETE 3+ VIEW COMPARISON:  04/19/2021. FINDINGS: No acute fracture or dislocation. Please note evaluation of the distal phalanx of second toe is limited due to overlapping with the adjacent toes. However, no discrete focal bone destruction/erosion noted. Mild-to-moderate hallux valgus deformity noted. Metallic K-wire/screws noted overlying the distal portion of the first and second metatarsals, similar to the prior study. Ankle mortise appears intact. No focal soft tissue swelling. No evidence of air within soft tissue. No radiopaque foreign bodies. IMPRESSION: No radiographic evidence of acute osteomyelitis involving the second toe. Correlate clinically to determine the need for additional imaging with MRI. Electronically Signed   By: Ree Molt M.D.   On: 03/26/2023 16:07   DG Chest Port 1 View Result Date: 03/26/2023 CLINICAL DATA:  Questionable sepsis - evaluate for abnormality. Fever. Headache. EXAM: PORTABLE CHEST 1 VIEW COMPARISON:  01/10/2023. FINDINGS: Low lung volume. There are probable atelectatic changes in the left lung base. Bilateral lung fields are otherwise clear. No dense consolidation or lung collapse. No pulmonary edema. Bilateral costophrenic angles are clear. Stable cardio-mediastinal silhouette. No acute osseous abnormalities. The soft tissues are within normal limits. Right-sided central venous catheter noted with its tip overlying the right atrium region. There is an  additional tubing overlying the left upper abdomen. IMPRESSION: No active disease. Electronically Signed   By: Ree Molt M.D.   On: 03/26/2023 16:03    Procedures Procedures    Medications Ordered in ED Medications  vancomycin  (VANCOCIN ) IVPB 1000 mg/200 mL premix (1,000 mg Intravenous New Bag/Given 03/26/23 2029)    Followed by  vancomycin  (  VANCOCIN ) 500 mg in sodium chloride  0.9 % 100 mL IVPB (has no administration in time range)  acetaminophen  (TYLENOL ) tablet 650 mg (has no administration in time range)    Or  acetaminophen  (TYLENOL ) suppository 650 mg (has no administration in time range)  melatonin tablet 3 mg (3 mg Oral Given 03/26/23 2323)  ondansetron  (ZOFRAN ) injection 4 mg (has no administration in time range)  naloxone  (NARCAN ) injection 0.4 mg (has no administration in time range)  fentaNYL  (SUBLIMAZE ) injection 25 mcg (25 mcg Intravenous Given 03/26/23 2323)  insulin  aspart (novoLOG ) injection 0-6 Units (has no administration in time range)  piperacillin -tazobactam (ZOSYN ) IVPB 2.25 g (has no administration in time range)  vancomycin  variable dose per unstable renal function (pharmacist dosing) (has no administration in time range)  amiodarone  (PACERONE ) tablet 200 mg (has no administration in time range)  aspirin  chewable tablet 81 mg (has no administration in time range)  sevelamer  carbonate (RENVELA ) tablet 800 mg (has no administration in time range)  lipase/protease/amylase (CREON ) capsule 36,000 Units (has no administration in time range)  sodium chloride  0.9 % bolus 500 mL (0 mLs Intravenous Stopped 03/26/23 1755)  piperacillin -tazobactam (ZOSYN ) IVPB 2.25 g (2.25 g Intravenous New Bag/Given 03/26/23 2328)    ED Course/ Medical Decision Making/ A&P                                 Medical Decision Making Risk Prescription drug management. Decision regarding hospitalization.   This patient presents to the ED with concern for fever, fatigue, weakness. This  involves an extensive number of treatment options, and is a complaint that carries with it a high risk of complications and morbidity.  The differential diagnosis includes infection versus metabolic derangement versus other  Co-morbidities that complicate the patient evaluation: End-stage renal disease; diabetes  Additional history obtained from patient's son  I ordered and personally interpreted labs.  The pertinent results include:  lactate 2.5 -> 1.8.  UA without evidence of infection.  CMP near baseline, glucose 228, blood culture pending  I ordered imaging studies including x-ray of the chest I independently visualized and interpreted imaging which showed no focal infiltrate I agree with the radiologist interpretation  The patient was maintained on a cardiac monitor.  I personally viewed and interpreted the cardiac monitored which showed an underlying rhythm of: A-fib with intermittent RVR  Per my interpretation the patient's ECG shows A-fib with mild tachycardia RVR  I ordered medication including broad-spectrum antibiotics for infection of unclear origin  I have reviewed the patients home medicines and have made adjustments as needed  Test Considered: Low suspicion for acute PE, meningitis; low suspicion intra-abdominal pathology or cause of her symptoms  After the interventions noted above, I reevaluated the patient and found that they have: stayed the same   Dispostion:  After consideration of the diagnostic results and the patients response to treatment, I feel that the patent would benefit from medical admission for bacteremia rule out, antibiotics.  Potential infectious source could include her chest wall port as well as left diabetic toe infection.  She is not needing emergency dialysis at this time.  Her next dialysis will be on Friday.         Final Clinical Impression(s) / ED Diagnoses Final diagnoses:  Infection    Rx / DC Orders ED Discharge Orders      None         Theoplis Garciagarcia, Donnice PARAS,  MD 03/27/23 9980

## 2023-03-26 NOTE — ED Triage Notes (Signed)
 Seen at PCP for left toe infection 3 weeks ago.  Fever/ feeling not good since Saturday. Headache, nausea, runny nose, increased cough  Seen by PCP sent for scan of toe/ infection site.  Recently had cath put in for dialysis recently another possible infection site (03/13/23)

## 2023-03-26 NOTE — Progress Notes (Signed)
 Plan of Care Note for accepted transfer   Patient: Kim Burns MRN: [id number]   DOA: 03/26/2023  Facility requesting transfer: [facility]   Requesting Provider: Dr. Cottie    Reason for transfer: Left foot cellulitis   Facility course: 88 year old female with IPMN of pancreas status post Whipple procedure, IDDM, RCC status post ablation, PAF, HFmrEF, CVA, OSA with CPAP intolerance, ESRD on hemodialysis, placement of new hemodialysis catheter on 03/13/2023, and recent left foot cellulitis who presents with ongoing chills, malaise, and redness involving the toe on the left foot despite completing oral antibiotics.   WBC 11,100 and lactic acid 2.5.  No evidence for osteomyelitis on plain radiographs of the foot.  Blood cultures were collected and the patient was given 500 mL IV fluid bolus, vancomycin , and Zosyn .  Plan of care: The patient is accepted for admission to Telemetry unit, at [facility].   Author: Evalene GORMAN Sprinkles, MD 03/26/2023  Check [email or web address] for on-call coverage.  Nursing staff, Please call [facility] Admits & Consults System-Wide number on Amion as soon as patient's arrival, so appropriate admitting provider can evaluate the pt.

## 2023-03-26 NOTE — Progress Notes (Signed)
 Pharmacy Antibiotic Note  Kim Burns is a 88 y.o. female admitted on 03/26/2023 with left foot cellulitis.  Pharmacy has been consulted for vancomycin  and Zosyn  dosing.  Noted patient just had a new hemodialysis catheter placed on 03/13/23.  First doses of antibiotics already ordered in the ED.  Plan: No standing vanc order post first dose Schedule Zosyn  2.25gm IV Q8H F/U HD schedule for subsequent vanc dosing Monitor clinical progress, vanc level as indicated     Temp (24hrs), Avg:99.2 F (37.3 C), Min:98.4 F (36.9 C), Max:100 F (37.8 C)  Recent Labs  Lab 03/26/23 1519 03/26/23 1803  WBC 11.1*  --   CREATININE 2.15*  --   LATICACIDVEN 2.5* 1.8    Estimated Creatinine Clearance: 15.8 mL/min (A) (by C-G formula based on SCr of 2.15 mg/dL (H)).    Allergies  Allergen Reactions   Lopid [Gemfibrozil] Other (See Comments)    Myalgia    Lotemax [Loteprednol Etabonate] Rash    Skin burning  Blurry vision   Statins Other (See Comments)    Rosuvastatin Muscle weakness   Norco [Hydrocodone -Acetaminophen ] Other (See Comments)    Headaches  Tolerates acetaminophen     Other Diarrhea    Real Butter - diarrhea   Vibra -Tab [Doxycycline ] Other (See Comments)    Unknown reaction   Amlodipine Other (See Comments)    Norvasc   Codeine Other (See Comments)    headache   Hydrocodone  Other (See Comments)   Hydrocodone -Acetaminophen  Other (See Comments)    Headache. Tolerates acetaminophen .   Lisinopril Other (See Comments)   Verapamil  Other (See Comments)    Vanc 2/5 >> Zosyn  2/5 >>  Kim Burns D. [place], PharmD, BCPS, BCCCP 03/26/2023, 10:29 PM

## 2023-03-27 ENCOUNTER — Telehealth (HOSPITAL_COMMUNITY): Payer: Self-pay | Admitting: *Deleted

## 2023-03-27 ENCOUNTER — Inpatient Hospital Stay (HOSPITAL_COMMUNITY): Payer: Medicare PPO

## 2023-03-27 DIAGNOSIS — Z992 Dependence on renal dialysis: Secondary | ICD-10-CM | POA: Diagnosis not present

## 2023-03-27 DIAGNOSIS — L03116 Cellulitis of left lower limb: Secondary | ICD-10-CM | POA: Diagnosis not present

## 2023-03-27 DIAGNOSIS — R8281 Pyuria: Secondary | ICD-10-CM | POA: Diagnosis present

## 2023-03-27 DIAGNOSIS — N186 End stage renal disease: Secondary | ICD-10-CM | POA: Diagnosis not present

## 2023-03-27 LAB — HEPATITIS B SURFACE ANTIGEN: Hepatitis B Surface Ag: NONREACTIVE

## 2023-03-27 LAB — CBC WITH DIFFERENTIAL/PLATELET
Abs Immature Granulocytes: 0.05 10*3/uL (ref 0.00–0.07)
Basophils Absolute: 0 10*3/uL (ref 0.0–0.1)
Basophils Relative: 0 %
Eosinophils Absolute: 0.3 10*3/uL (ref 0.0–0.5)
Eosinophils Relative: 3 %
HCT: 27.9 % — ABNORMAL LOW (ref 36.0–46.0)
Hemoglobin: 8.5 g/dL — ABNORMAL LOW (ref 12.0–15.0)
Immature Granulocytes: 1 %
Lymphocytes Relative: 5 %
Lymphs Abs: 0.6 10*3/uL — ABNORMAL LOW (ref 0.7–4.0)
MCH: 29 pg (ref 26.0–34.0)
MCHC: 30.5 g/dL (ref 30.0–36.0)
MCV: 95.2 fL (ref 80.0–100.0)
Monocytes Absolute: 0.6 10*3/uL (ref 0.1–1.0)
Monocytes Relative: 6 %
Neutro Abs: 9 10*3/uL — ABNORMAL HIGH (ref 1.7–7.7)
Neutrophils Relative %: 85 %
Platelets: 238 10*3/uL (ref 150–400)
RBC: 2.93 MIL/uL — ABNORMAL LOW (ref 3.87–5.11)
RDW: 19.3 % — ABNORMAL HIGH (ref 11.5–15.5)
WBC: 10.6 10*3/uL — ABNORMAL HIGH (ref 4.0–10.5)
nRBC: 0 % (ref 0.0–0.2)

## 2023-03-27 LAB — COMPREHENSIVE METABOLIC PANEL
ALT: 50 U/L — ABNORMAL HIGH (ref 0–44)
AST: 27 U/L (ref 15–41)
Albumin: 2.1 g/dL — ABNORMAL LOW (ref 3.5–5.0)
Alkaline Phosphatase: 91 U/L (ref 38–126)
Anion gap: 12 (ref 5–15)
BUN: 27 mg/dL — ABNORMAL HIGH (ref 8–23)
CO2: 26 mmol/L (ref 22–32)
Calcium: 8.7 mg/dL — ABNORMAL LOW (ref 8.9–10.3)
Chloride: 100 mmol/L (ref 98–111)
Creatinine, Ser: 2.71 mg/dL — ABNORMAL HIGH (ref 0.44–1.00)
GFR, Estimated: 17 mL/min — ABNORMAL LOW (ref >60)
Glucose, Bld: 142 mg/dL — ABNORMAL HIGH (ref 70–99)
Potassium: 3.5 mmol/L (ref 3.5–5.1)
Sodium: 138 mmol/L (ref 135–145)
Total Bilirubin: 0.7 mg/dL (ref 0.0–1.2)
Total Protein: 6 g/dL — ABNORMAL LOW (ref 6.5–8.1)

## 2023-03-27 LAB — MAGNESIUM: Magnesium: 2.3 mg/dL (ref 1.7–2.4)

## 2023-03-27 LAB — SEDIMENTATION RATE: Sed Rate: 85 mm/h — ABNORMAL HIGH (ref 0–22)

## 2023-03-27 LAB — HEMOGLOBIN A1C
Hgb A1c MFr Bld: 5.4 % (ref 4.8–5.6)
Mean Plasma Glucose: 108.28 mg/dL

## 2023-03-27 LAB — C-REACTIVE PROTEIN: CRP: 6.2 mg/dL — ABNORMAL HIGH (ref <1.0)

## 2023-03-27 LAB — PHOSPHORUS: Phosphorus: 6.9 mg/dL — ABNORMAL HIGH (ref 2.5–4.6)

## 2023-03-27 LAB — GLUCOSE, CAPILLARY
Glucose-Capillary: 137 mg/dL — ABNORMAL HIGH (ref 70–99)
Glucose-Capillary: 229 mg/dL — ABNORMAL HIGH (ref 70–99)
Glucose-Capillary: 157 mg/dL — ABNORMAL HIGH (ref 70–99)
Glucose-Capillary: 196 mg/dL — ABNORMAL HIGH (ref 70–99)

## 2023-03-27 MED ORDER — CHLORHEXIDINE GLUCONATE CLOTH 2 % EX PADS
6.0000 | MEDICATED_PAD | Freq: Every day | CUTANEOUS | Status: DC
  Administered 2023-03-27 – 2023-03-29 (×3): 6 via TOPICAL

## 2023-03-27 MED ORDER — SODIUM CHLORIDE 0.9% FLUSH
10.0000 mL | Freq: Two times a day (BID) | INTRAVENOUS | Status: DC
  Administered 2023-03-27 – 2023-03-28 (×4): 10 mL

## 2023-03-27 MED ORDER — SODIUM CHLORIDE 0.9% FLUSH: 10.0000 mL | INTRAVENOUS | Status: DC | PRN

## 2023-03-27 MED ORDER — CEFTRIAXONE SODIUM 2 G IJ SOLR
2.0000 g | INTRAMUSCULAR | Status: DC
  Administered 2023-03-27 – 2023-03-28 (×2): 2 g via INTRAVENOUS
  Filled 2023-03-27 (×2): qty 20

## 2023-03-27 MED ORDER — METOPROLOL SUCCINATE ER 25 MG PO TB24: 25.0000 mg | ORAL_TABLET | ORAL | Status: DC

## 2023-03-27 MED ORDER — INSULIN ASPART 100 UNIT/ML IJ SOLN
2.0000 [IU] | Freq: Three times a day (TID) | INTRAMUSCULAR | Status: DC
  Administered 2023-03-28 – 2023-03-29 (×4): 2 [IU] via SUBCUTANEOUS

## 2023-03-27 MED ORDER — ASPIRIN 81 MG PO CHEW
81.0000 mg | CHEWABLE_TABLET | ORAL | Status: DC
  Administered 2023-03-28: 81 mg via ORAL
  Filled 2023-03-27: qty 1

## 2023-03-27 MED ORDER — DARBEPOETIN ALFA 200 MCG/0.4ML IJ SOSY
200.0000 ug | PREFILLED_SYRINGE | INTRAMUSCULAR | Status: DC
  Administered 2023-03-28: 200 ug via SUBCUTANEOUS
  Filled 2023-03-27: qty 0.4

## 2023-03-27 MED ORDER — PANCRELIPASE (LIP-PROT-AMYL) 36000-114000 UNITS PO CPEP
36000.0000 [IU] | ORAL_CAPSULE | Freq: Three times a day (TID) | ORAL | Status: DC
  Administered 2023-03-27 – 2023-03-29 (×6): 36000 [IU] via ORAL
  Filled 2023-03-27 (×8): qty 1

## 2023-03-27 MED ORDER — SEVELAMER CARBONATE 800 MG PO TABS
800.0000 mg | ORAL_TABLET | Freq: Three times a day (TID) | ORAL | Status: DC
  Administered 2023-03-27 – 2023-03-29 (×6): 800 mg via ORAL
  Filled 2023-03-27 (×6): qty 1

## 2023-03-27 MED ORDER — AMIODARONE HCL 200 MG PO TABS
200.0000 mg | ORAL_TABLET | Freq: Every day | ORAL | Status: DC
  Administered 2023-03-27 – 2023-03-29 (×3): 200 mg via ORAL
  Filled 2023-03-27 (×3): qty 1

## 2023-03-27 MED ORDER — SEVELAMER CARBONATE 800 MG PO TABS: 800.0000 mg | ORAL_TABLET | Freq: Two times a day (BID) | ORAL | Status: DC | PRN

## 2023-03-27 MED ORDER — HEPARIN SODIUM (PORCINE) 5000 UNIT/ML IJ SOLN
5000.0000 [IU] | Freq: Three times a day (TID) | INTRAMUSCULAR | Status: DC
  Filled 2023-03-27 (×3): qty 1

## 2023-03-27 MED ORDER — METOPROLOL TARTRATE 5 MG/5ML IV SOLN
2.5000 mg | Freq: Once | INTRAVENOUS | Status: AC
  Administered 2023-03-27: 2.5 mg via INTRAVENOUS
  Filled 2023-03-27: qty 5

## 2023-03-27 MED ORDER — VANCOMYCIN HCL 500 MG/100ML IV SOLN
500.0000 mg | INTRAVENOUS | Status: DC
Start: 2023-03-28 — End: 2023-03-29
  Administered 2023-03-28: 500 mg via INTRAVENOUS
  Filled 2023-03-27: qty 100

## 2023-03-27 MED ORDER — ASPIRIN 81 MG PO CHEW
81.0000 mg | CHEWABLE_TABLET | ORAL | Status: DC
  Administered 2023-03-27 – 2023-03-29 (×2): 81 mg via ORAL
  Filled 2023-03-27 (×2): qty 1

## 2023-03-27 MED ORDER — CHLORHEXIDINE GLUCONATE CLOTH 2 % EX PADS
6.0000 | MEDICATED_PAD | Freq: Every day | CUTANEOUS | Status: DC
  Administered 2023-03-28 – 2023-03-29 (×2): 6 via TOPICAL

## 2023-03-27 NOTE — Inpatient Diabetes Management (Signed)
 Inpatient Diabetes Program Recommendations  AACE/ADA: New Consensus Statement on Inpatient Glycemic Control (2015)  Target Ranges:  Prepandial:   less than 140 mg/dL      Peak postprandial:   less than 180 mg/dL (1-2 hours)      Critically ill patients:  140 - 180 mg/dL   Lab Results  Component Value Date   GLUCAP 229 (H) 03/27/2023   HGBA1C 5.4 03/27/2023    Review of Glycemic Control  Latest Reference Range & Units 03/26/23 22:56 03/27/23 06:19 03/27/23 11:30  Glucose-Capillary 70 - 99 mg/dL 754 (H) 862 (H) 770 (H)   Diabetes history: DM 2 Outpatient Diabetes medications:  Novolog  5-7 units tid with meals  Current orders for Inpatient glycemic control:  Novolog  0-6 units tid with meals   Inpatient Diabetes Program Recommendations:   May consider adding Novolog  2 units tid with meals (hold if patient eats less than 50% or NPO).   Thanks,  Randall Bullocks, RN, BC-ADM Inpatient Diabetes Coordinator Pager [phone]  (8a-5p)

## 2023-03-27 NOTE — Progress Notes (Addendum)
 PROGRESS NOTE  Kim Burns FMW:[id number] DOB: 05-15-1935 DOA: 03/26/2023 PCP: Waylan Almarie [name], MD   LOS: 1 day   Brief narrative:  Kim Burns is a 88 y.o. female with medical history significant for intraductal papillary mucinous neoplasm of the pancreas status post Whipple procedure in 2018, type 2 diabetes mellitus, paroxysmal atrial fibrillation, chronic diastolic heart failure, end-stage renal disease on hemodialysis on Monday, Wednesday, Friday schedule, presented to the hospital with 2 weeks of redness on the left second toe, foot with some swelling.  She initially presented to [facility] ED where she was noted to be febrile with a temperature of 100 F.  Labs showed hyperglycemia.  Initial lactate was elevated at 2.5 repeat at 1.8.  She did have mild leukocytosis and hemoglobin was 8.6.  Urinalysis showed 11-20 white cells.  Blood cultures were drawn in the ED. COVID, influenza, RSV PCR were all negative.  EKG showed normal sinus rhythm.  X-ray of the left foot showed no evidence of osteomyelitis or foreign body.  Chest x-ray was negative for acute infiltrate.  In the ED patient received IV vancomycin  and Zosyn  normal saline 500 mL bolus and was admitted hospital for left foot cellulitis failed outpatient treatment.  Assessment/Plan: Principal Problem:   Cellulitis of left foot Active Problems:   ESRD on dialysis (HCC)   Lactic acidosis   Paroxysmal atrial fibrillation (HCC)   IPMN (intraductal papillary mucinous neoplasm)   DM2 (diabetes mellitus, type 2) (HCC)   Chronic diastolic CHF (congestive heart failure) (HCC)   History of anemia due to chronic kidney disease   Sterile pyuria  Cellulitis of the left lower extremity/foot:  Patient with history of failure to outpatient antibiotic with Keflex .  Febrile and leukocytosis in the ED.  Follow  blood cultures.  Has been started on vancomycin  and Zosyn .     CRP elevated at 6.2 and ESR of 85.[name]  Hemoglobin A1c of 5.4.  Blood  cultures negative in less than 12 hours.  Temperature max of 100 F.  MRI of the foot with some bilateral edema.  Communicated with orthopedics for consultation.   Lactic Acidosis: Initial lactate of 2.5, with interval decline to 1.8 following interval administration of 500 cc NS bolus.     Asymptomatic Pyuria:  WBC in urine but without urinary symptoms.  Already on antibiotic.  Urine cultures were sent.       Type 2 Diabetes Mellitus with hyperglycemia: Latest hemoglobin A1c noted to be 6.1% when checked in September 2024.  Continue sliding scale insulin  and Accu-Cheks.  Paroxysmal atrial fibrillation:  Rate controlled at this time.  CHA2DS2-VASc score of  5, continue metoprolol  succinate, which she takes on Tuesday, Thursday, Saturday and Sunday, associated with her nausea on hemodialysis days.  Continue aspirin , telemetry monitoring.  Recent 2D echocardiogram from December 2024, and notable for LVEF 6065%, no evidence of focal wall motion with grade 3 diastolic dysfunction.      Chronic diastolic heart failure:  Review of latest 2D echocardiogram from December 2024, which is notable for LVEF 60 to 65% as well as grade 3 diastolic dysfunction, currently compensated.  Continue intake and output charting Eliquis .  Volume management as per dialysis.    ESRD: on HD on hemodialysis Monday Wednesday Friday next dialysis session 03/28/2023.  Appears to be compensated.  Nephrology consulted for hemodialysis.  Has percutaneous nephrostomy tube in place.  History of intraductal papillary mucinous neoplasm: Documented history of such, status post Whipple procedure in 2018. On Creon  as  an outpatient.  Will continue for now.    Anemia of chronic kidney disease: baseline hgb range 7-11,.  Continue to monitor.    DVT prophylaxis: SCDs Start: 03/26/23 2215   Disposition: Uncertain, Likely home in 2-3 days.  Will get PT evaluation.  Status is: Inpatient  Remains inpatient appropriate because: Cellulitis  failed outpatient treatment, IV antibiotic, pending clinical improvement,    Code Status:     Code Status: Full Code  Family Communication:  spoke with the patient's son on the phone and updated him about the clinical condition of the patient.   Consultants: Nephrology Nephrology  Procedures: None  Anti-infectives:  Vanco and Zosyn  IV  Anti-infectives (From admission, onward)    Start     Dose/Rate Route Frequency Ordered Stop   03/28/23 1800  vancomycin  (VANCOREADY) IVPB 500 mg/100 mL        500 mg 100 mL/hr over 60 Minutes Intravenous Every M-W-F (Hemodialysis) 03/27/23 1043     03/27/23 0800  piperacillin -tazobactam (ZOSYN ) IVPB 2.25 g        2.25 g 100 mL/hr over 30 Minutes Intravenous Every 8 hours 03/26/23 2231     03/26/23 2231  vancomycin  variable dose per unstable renal function (pharmacist dosing)         Does not apply See admin instructions 03/26/23 2231     03/26/23 2115  vancomycin  (VANCOCIN ) 500 mg in sodium chloride  0.9 % 100 mL IVPB       Placed in Followed by Linked Group   500 mg 100 mL/hr over 60 Minutes Intravenous  Once 03/26/23 2012 03/27/23 0131   03/26/23 2015  piperacillin -tazobactam (ZOSYN ) IVPB 2.25 g        2.25 g 100 mL/hr over 30 Minutes Intravenous  Once 03/26/23 2012 03/26/23 2358   03/26/23 1945  vancomycin  (VANCOCIN ) IVPB 1000 mg/200 mL premix       Placed in Followed by Linked Group   1,000 mg 200 mL/hr over 60 Minutes Intravenous  Once 03/26/23 2012 03/26/23 2129       Subjective:  Today, patient was seen and examined at bedside.  Patient complains of fatigue and weakness with pain over the left lower extremity.  Denies any nausea vomiting.  Has shortness of breath on exertion but not new.  Objective: Vitals:   03/27/23 0757 03/27/23 0804  BP: (!) 133/34 136/79  Pulse:  (!) 119  Resp: 17   Temp: (!) 97.5 F (36.4 C)   SpO2: 99%     Intake/Output Summary (Last 24 hours) at 03/27/2023 1135 Last data filed at 03/27/2023  0843 Gross per 24 hour  Intake 740 ml  Output 100 ml  Net 640 ml   Filed Weights   03/26/23 2330  Weight: 55.3 kg   Body mass index is 21.61 kg/m.   Physical Exam:  GENERAL: Patient is alert awake and oriented. Not in obvious distress.  On nasal cannula oxygen, elderly female, HENT: No scleral pallor or icterus. Pupils equally reactive to light. Oral mucosa is moist NECK: is supple, no gross swelling noted.  Right internal jugular hemodialysis catheter. CHEST: Clear to auscultation. No crackles or wheezes.  CVS: S1 and S2 heard, no murmur.  Irregular rhythm. ABDOMEN: Soft, non-tender, bowel sounds are present.  Left nephrostomy tube in place. EXTREMITIES: Left lower extremity with foot erythema with small wound over the left second toe left upper extremity fistula in place. CNS: Cranial nerves are intact. No focal motor deficits. SKIN: warm and dry   Data  Review: I have personally reviewed the following laboratory data and studies,  CBC: Recent Labs  Lab 03/26/23 1519 03/27/23 0622  WBC 11.1* 10.6*  NEUTROABS 10.0* 9.0*  HGB 8.6* 8.5*  HCT 29.3* 27.9*  MCV 96.7 95.2  PLT 225 238   Basic Metabolic Panel: Recent Labs  Lab 03/26/23 1519 03/27/23 0622  NA 139 138  K 3.7 3.5  CL 96* 100  CO2 33* 26  GLUCOSE 228* 142*  BUN 19 27*  CREATININE 2.15* 2.71*  CALCIUM  8.4* 8.7*  MG  --  2.3  PHOS  --  6.9*   Liver Function Tests: Recent Labs  Lab 03/26/23 1519 03/27/23 0622  AST 36 27  ALT 56* 50*  ALKPHOS 98 91  BILITOT 0.4 0.7  PROT 6.4* 6.0*  ALBUMIN  2.7* 2.1*   No results for input(s): LIPASE, AMYLASE in the last 168 hours. No results for input(s): AMMONIA in the last 168 hours. Cardiac Enzymes: No results for input(s): CKTOTAL, CKMB, CKMBINDEX, TROPONINI in the last 168 hours. BNP (last 3 results) Recent Labs    09/14/22 0512 12/23/22 2331  BNP 1,469.0* 1,423.9*    ProBNP (last 3 results) No results for input(s): PROBNP in the  last 8760 hours.  CBG: Recent Labs  Lab 03/26/23 2256 03/27/23 0619 03/27/23 1130  GLUCAP 245* 137* 229*   Recent Results (from the past 240 hours)  Resp panel by RT-PCR (RSV, Flu A&B, Covid) Anterior Nasal Swab     Status: None   Collection Time: 03/26/23  3:09 PM   Specimen: Anterior Nasal Swab  Result Value Ref Range Status   SARS Coronavirus 2 by RT PCR NEGATIVE NEGATIVE Final    Comment: (NOTE) SARS-CoV-2 target nucleic acids are NOT DETECTED.  The SARS-CoV-2 RNA is generally detectable in upper respiratory specimens during the acute phase of infection. The lowest concentration of SARS-CoV-2 viral copies this assay can detect is 138 copies/mL. A negative result does not preclude SARS-Cov-2 infection and should not be used as the sole basis for treatment or other patient management decisions. A negative result may occur with  improper specimen collection/handling, submission of specimen other than nasopharyngeal swab, presence of viral mutation(s) within the areas targeted by this assay, and inadequate number of viral copies(<138 copies/mL). A negative result must be combined with clinical observations, patient history, and epidemiological information. The expected result is Negative.  Fact Sheet for Patients:  bloggercourse.com  Fact Sheet for Healthcare Providers:  seriousbroker.it  This test is no t yet approved or cleared by the [place]  FDA and  has been authorized for detection and/or diagnosis of SARS-CoV-2 by FDA under an Emergency Use Authorization (EUA). This EUA will remain  in effect (meaning this test can be used) for the duration of the COVID-19 declaration under Section 564(b)(1) of the Act, 21 U.S.C.section 360bbb-3(b)(1), unless the authorization is terminated  or revoked sooner.       Influenza A by PCR NEGATIVE NEGATIVE Final   Influenza B by PCR NEGATIVE NEGATIVE Final    Comment:  (NOTE) The Xpert Xpress SARS-CoV-2/FLU/RSV plus assay is intended as an aid in the diagnosis of influenza from Nasopharyngeal swab specimens and should not be used as a sole basis for treatment. Nasal washings and aspirates are unacceptable for Xpert Xpress SARS-CoV-2/FLU/RSV testing.  Fact Sheet for Patients: bloggercourse.com  Fact Sheet for Healthcare Providers: seriousbroker.it  This test is not yet approved or cleared by the [place]  FDA and has been authorized for detection and/or diagnosis  of SARS-CoV-2 by FDA under an Emergency Use Authorization (EUA). This EUA will remain in effect (meaning this test can be used) for the duration of the COVID-19 declaration under Section 564(b)(1) of the Act, 21 U.S.C. section 360bbb-3(b)(1), unless the authorization is terminated or revoked.     Resp Syncytial Virus by PCR NEGATIVE NEGATIVE Final    Comment: (NOTE) Fact Sheet for Patients: bloggercourse.com  Fact Sheet for Healthcare Providers: seriousbroker.it  This test is not yet approved or cleared by the [place]  FDA and has been authorized for detection and/or diagnosis of SARS-CoV-2 by FDA under an Emergency Use Authorization (EUA). This EUA will remain in effect (meaning this test can be used) for the duration of the COVID-19 declaration under Section 564(b)(1) of the Act, 21 U.S.C. section 360bbb-3(b)(1), unless the authorization is terminated or revoked.  Performed at [facility], [address], [place], [place] [address]   Blood Culture (routine x 2)     Status: None (Preliminary result)   Collection Time: 03/26/23  3:12 PM   Specimen: BLOOD  Result Value Ref Range Status   Specimen Description   Final    BLOOD LEFT ANTECUBITAL Performed at [facility], [address], [place], [place] [address]    Special Requests    Final    BOTTLES DRAWN AEROBIC AND ANAEROBIC Blood Culture adequate volume Performed at [facility], [address], [place], [place] [address]    Culture   Final    NO GROWTH < 12 HOURS Performed at [facility], [address] [place], [place] [address]    Report Status PENDING  Incomplete     Studies: DG Foot Complete Left Result Date: 03/26/2023 CLINICAL DATA:  wound to 2nd toe. EXAM: LEFT FOOT - COMPLETE 3+ VIEW COMPARISON:  04/19/2021. FINDINGS: No acute fracture or dislocation. Please note evaluation of the distal phalanx of second toe is limited due to overlapping with the adjacent toes. However, no discrete focal bone destruction/erosion noted. Mild-to-moderate hallux valgus deformity noted. Metallic K-wire/screws noted overlying the distal portion of the first and second metatarsals, similar to the prior study. Ankle mortise appears intact. No focal soft tissue swelling. No evidence of air within soft tissue. No radiopaque foreign bodies. IMPRESSION: No radiographic evidence of acute osteomyelitis involving the second toe. Correlate clinically to determine the need for additional imaging with MRI. Electronically Signed   By: Ree Molt M.D.   On: 03/26/2023 16:07   DG Chest Port 1 View Result Date: 03/26/2023 CLINICAL DATA:  Questionable sepsis - evaluate for abnormality. Fever. Headache. EXAM: PORTABLE CHEST 1 VIEW COMPARISON:  01/10/2023. FINDINGS: Low lung volume. There are probable atelectatic changes in the left lung base. Bilateral lung fields are otherwise clear. No dense consolidation or lung collapse. No pulmonary edema. Bilateral costophrenic angles are clear. Stable cardio-mediastinal silhouette. No acute osseous abnormalities. The soft tissues are within normal limits. Right-sided central venous catheter noted with its tip overlying the right atrium region. There is an additional tubing overlying the left upper abdomen. IMPRESSION: No active  disease. Electronically Signed   By: Ree Molt M.D.   On: 03/26/2023 16:03      Vernal Alstrom, MD  [facility] 03/27/2023  If 7PM-7AM, please contact night-coverage

## 2023-03-27 NOTE — Consult Note (Signed)
 Renal Service Consult Note [facility]  Kim Burns 03/27/2023 Kim JONETTA Fret, MD Requesting Physician: Dr. Sonjia  Reason for Consult: ESRD pt w/ foot infection HPI: The patient is a 88 y.o. year-old w/ PMH as below who presented to ED 2/05 c/o fevers x 5 days and left foot toe infection.  Pt has esrd on HD MWF. In ED SBP were 100- 140s, HR 90s, RR 15-25, SpO2 98% on RA. Lab showed K+ 3.7, LA 2.5, wBc 11k, Hb 8.6. BCx's were sent and IV abx started. Pt was admitted. We are asked to see for dialysis.   Pt seen in room. C/o feeling tired and weak. No n/v. No SOB at rest. +DOE which is chronic.  Has not missed HD.    ROS - denies CP, no joint pain, no HA, no blurry vision, no rash, no diarrhea, no nausea/ vomiting, no dysuria, no difficulty voiding   Past Medical History  Past Medical History:  Diagnosis Date   Anemia of chronic disease    takes iron   Anxiety    Atrial fibrillation (HCC)    Blood transfusion without reported diagnosis    Bradycardia    Diabetes mellitus without complication (HCC)    became diabetic after Whipple procedure   Diarrhea    Dysrhythmia 04/16/2016   bradycardia due to medication    ESRD on hemodialysis [facility])    M-W-F   GERD (gastroesophageal reflux disease)    Gout    Headache    History of kidney stones 06/2013   Hyperparathyroidism (HCC)    Hypertension    PONV (postoperative nausea and vomiting)    Sleep apnea    no cpap machine. could not tolerate   Stroke (HCC) 04/16/2016   TIA 1995   Vitamin D  deficiency    Past Surgical History  Past Surgical History:  Procedure Laterality Date   A/V FISTULAGRAM Left 08/30/2021   Procedure: A/V Fistulagram;  Surgeon: Gretta Lonni PARAS, MD;  Location: [facility] INVASIVE CV LAB;  Service: Cardiovascular;  Laterality: Left;   A/V FISTULAGRAM N/A 02/25/2023   Procedure: A/V Fistulagram;  Surgeon: Sheree Penne Lonni, MD;  Location: [facility] INVASIVE CV LAB;  Service: Vascular;   Laterality: N/A;   ABDOMINAL HYSTERECTOMY  1985   complete   BACK SURGERY  1980   lower   BASCILIC VEIN TRANSPOSITION Left 04/24/2017   Procedure: LEFT ARM FIRST STAGE BASILIC VEIN TRANSPOSITION;  Surgeon: Serene Gaile ORN, MD;  Location: [facility] OR;  Service: Vascular;  Laterality: Left;   BASCILIC VEIN TRANSPOSITION Left 07/10/2017   Procedure: SECOND STAGE BASILIC VEIN TRANSPOSITION LEFT ARM;  Surgeon: Serene Gaile ORN, MD;  Location: [facility] OR;  Service: Vascular;  Laterality: Left;   BREAST LUMPECTOMY Left x 2   many years apart, benign   CHOLECYSTECTOMY  1985   CYSTOSCOPY W/ URETERAL STENT PLACEMENT Left 10/31/2021   Procedure: CYSTOSCOPY WITH RETROGRADE PYELOGRAM/URETERAL STENT PLACEMENT;  Surgeon: Cam Morene ORN, MD;  Location: [facility] OR;  Service: Urology;  Laterality: Left;   CYSTOSCOPY WITH URETEROSCOPY AND STENT PLACEMENT Left 06/18/2013   Procedure: CYSTOSCOPY WITH Lef URETEROSCOPY AND Left STENT PLACEMENT;  Surgeon: Noretta Ferrara, MD;  Location: [facility] ORS;  Service: Urology;  Laterality: Left;   DIALYSIS/PERMA CATHETER INSERTION N/A 03/13/2023   Procedure: DIALYSIS/PERMA CATHETER INSERTION;  Surgeon: Tobie Gordy POUR, MD;  Location: [facility] INVASIVE CV LAB;  Service: Cardiovascular;  Laterality: N/A;   ESOPHAGOGASTRODUODENOSCOPY (EGD) WITH PROPOFOL  N/A 11/13/2016   Procedure: ESOPHAGOGASTRODUODENOSCOPY (EGD) WITH PROPOFOL ;  Surgeon: Elicia Claw, MD;  Location: [facility] ENDOSCOPY;  Service: Gastroenterology;  Laterality: N/A;   EUS N/A 02/07/2016   Procedure: ESOPHAGEAL ENDOSCOPIC ULTRASOUND (EUS) RADIAL;  Surgeon: Elsie Cree, MD;  Location: [facility] ENDOSCOPY;  Service: Endoscopy;  Laterality: N/A;   EYE SURGERY Bilateral 2014   ioc for catracts    FOOT SURGERY Left 1990   something with toes unsure what    HERNIA REPAIR  2018   IR CATHETER TUBE CHANGE  03/14/2022   IR EMBO TUMOR ORGAN ISCHEMIA INFARCT INC GUIDE ROADMAPPING  08/22/2021   IR NEPHROSTOMY EXCHANGE LEFT  06/27/2022   IR NEPHROSTOMY  EXCHANGE LEFT  08/26/2022   IR NEPHROSTOMY EXCHANGE LEFT  10/22/2022   IR NEPHROSTOMY EXCHANGE LEFT  12/16/2022   IR NEPHROSTOMY EXCHANGE LEFT  12/30/2022   IR NEPHROSTOMY EXCHANGE LEFT  02/10/2023   IR NEPHROSTOMY PLACEMENT LEFT  03/14/2022   IR RADIOLOGIST EVAL & MGMT  05/18/2021   IR RADIOLOGIST EVAL & MGMT  10/01/2021   IR RADIOLOGIST EVAL & MGMT  11/12/2021   IR RADIOLOGIST EVAL & MGMT  11/22/2021   IR RADIOLOGIST EVAL & MGMT  11/27/2021   IR RADIOLOGIST EVAL & MGMT  12/20/2021   IR RENAL SUPRASEL UNI S&I MOD SED  08/22/2021   IR US  GUIDE VASC ACCESS RIGHT  08/22/2021   LEFT HEART CATH AND CORONARY ANGIOGRAPHY N/A 08/27/2021   Procedure: LEFT HEART CATH AND CORONARY ANGIOGRAPHY;  Surgeon: Verlin Lonni BIRCH, MD;  Location: [facility] INVASIVE CV LAB;  Service: Cardiovascular;  Laterality: N/A;   PARATHYROID  EXPLORATION     PERIPHERAL VASCULAR BALLOON ANGIOPLASTY Left 08/30/2021   Procedure: PERIPHERAL VASCULAR BALLOON ANGIOPLASTY;  Surgeon: Gretta Lonni PARAS, MD;  Location: [facility] INVASIVE CV LAB;  Service: Cardiovascular;  Laterality: Left;  arm fistula   PERIPHERAL VASCULAR BALLOON ANGIOPLASTY Left 02/25/2023   Procedure: PERIPHERAL VASCULAR BALLOON ANGIOPLASTY;  Surgeon: Sheree Penne Lonni, MD;  Location: [facility] INVASIVE CV LAB;  Service: Vascular;  Laterality: Left;  left AVF   RADIOLOGY WITH ANESTHESIA Left 08/22/2021   Procedure: MICROWAVE ABLATION;  Surgeon: Adele Rush, MD;  Location: [facility] ORS;  Service: Anesthesiology;  Laterality: Left;   WHIPPLE PROCEDURE N/A 04/23/2016   Procedure: WHIPPLE PROCEDURE;  Surgeon: Jina Nephew, MD;  Location: [facility] OR;  Service: General;  Laterality: N/A;   Family History  Family History  Problem Relation Age of Onset   Heart disease Mother    Stroke Mother    Cancer Father        colon   Alzheimer's disease Sister    Alzheimer's disease Sister    Cancer Brother        brain   Breast cancer Neg Hx    Social History  reports that she has  never smoked. She has never used smokeless tobacco. She reports that she does not drink alcohol  and does not use drugs. Allergies  Allergies  Allergen Reactions   Lopid [Gemfibrozil] Other (See Comments)    Myalgia    Lotemax [Loteprednol Etabonate] Rash    Skin burning  Blurry vision   Statins Other (See Comments)    Rosuvastatin Muscle weakness   Norco [Hydrocodone -Acetaminophen ] Other (See Comments)    Headaches  Tolerates acetaminophen     Other Diarrhea    Real Butter - diarrhea   Vibra -Tab [Doxycycline ] Other (See Comments)    Unknown reaction   Amlodipine Other (See Comments)    Norvasc   Codeine Other (See Comments)    headache   Hydrocodone  Other (See  Comments)   Hydrocodone -Acetaminophen  Other (See Comments)    Headache. Tolerates acetaminophen .   Lisinopril Other (See Comments)   Verapamil  Other (See Comments)   Home medications Prior to Admission medications   Medication Sig Start Date End Date Taking? Authorizing Provider  acetaminophen  (TYLENOL ) 325 MG tablet Take 2 tablets (650 mg total) by mouth every 6 (six) hours as needed for mild pain (pain score 1-3), moderate pain (pain score 4-6), fever or headache. 01/02/23  Yes Von Bellis, MD  amantadine  (SYMMETREL ) 100 MG capsule Take 1 capsule (100 mg total) by mouth once a week. 11/20/22  Yes Love, Sharlet RAMAN, PA-C  amiodarone  (PACERONE ) 200 MG tablet Take 1 tablet (200 mg total) by mouth daily. 11/15/22  Yes Love, Sharlet RAMAN, PA-C  aspirin  81 MG chewable tablet Chew 1 tablet (81 mg total) by mouth daily. Patient taking differently: Chew 81 mg by mouth daily. Take 81mg  by mouth every morning except on dialysis days, hold in the morning and take after dialysis. 11/13/22  Yes Love, Sharlet RAMAN, PA-C  Azelastine  HCl 137 MCG/SPRAY SOLN Place 1-2 sprays into both nostrils daily as needed (allergies). 11/29/22  Yes [provider]  B Complex-C-Zn-Folic Acid  (DIALYVITE 800 WITH ZINC ) 0.8 MG TABS Take 1 tablet by mouth  daily.   Yes [provider]  cycloSPORINE  (RESTASIS ) 0.05 % ophthalmic emulsion Place 1 drop into both eyes daily as needed (irritation). Patient taking differently: Place 1 drop into both eyes daily as needed (dryness). 11/13/22  Yes Love, Sharlet RAMAN, PA-C  diphenhydramine -acetaminophen  (TYLENOL  PM) 25-500 MG TABS tablet Take 2 tablets by mouth at bedtime.   Yes [provider]  hydrALAZINE  (APRESOLINE ) 50 MG tablet Take 50 mg by mouth every 8 (eight) hours as needed (for BP greater than 160).   Yes [provider]  insulin  aspart (NOVOLOG  FLEXPEN) 100 UNIT/ML FlexPen Inject 5-7 Units into the skin 3 (three) times daily with meals. Sliding scale.   Yes [provider]  lidocaine -prilocaine  (EMLA ) cream Apply 1 Application topically every Monday, Wednesday, and Friday with hemodialysis.   Yes [provider]  lipase/protease/amylase (CREON ) 36000 UNITS CPEP capsule Take 36,000 Units by mouth See admin instructions. Take 36,000 units (1 capsule) by mouth with meals and snacks.   Yes [provider]  meclizine  (ANTIVERT ) 25 MG tablet Take 1 tablet (25 mg total) by mouth 3 (three) times daily as needed for dizziness. 01/02/23 01/02/24 Yes Von Bellis, MD  metoprolol  succinate (TOPROL -XL) 25 MG 24 hr tablet Take 1 tablet (25 mg total) by mouth 3 (three) times a week. Take on nondialysis days, Tuesday, Thursday, Saturday and Sunday if systolic BP greater than 130 mmHg 01/03/23  Yes Von Bellis, MD  midodrine  (PROAMATINE ) 10 MG tablet Take 1 tablet (10 mg total) by mouth See admin instructions. Take 10mg  by mouth before dialysis. Bring with you to dialysis in case another dose is required. May take 10 mg 3 times a day if systolic BP less than 120 mmHg 01/02/23 01/02/24 Yes Von Bellis, MD  olopatadine  (PATADAY ) 0.1 % ophthalmic solution Place 1 drop into both eyes 2 (two) times daily as needed for allergies.   Yes [provider]  rOPINIRole   (REQUIP ) 0.25 MG tablet Take 1 tablet (0.25 mg total) by mouth 2 (two) times daily. Patient taking differently: Take 0.25-0.5 mg by mouth See admin instructions. Take 0.5mg  (1 tablet) by mouth every night at bedtime and 0.25mg  (1/2 tablet) during the day as needed. 09/24/22  Yes Sebastian Sieving  V, MD  sevelamer  carbonate (RENVELA ) 800 MG tablet Take 1 tablet (800 mg total) by mouth 3 (three) times daily with meals. Patient taking differently: Take 800 mg by mouth See admin instructions. Take 800mg  by mouth with meals or snacks. 11/13/22  Yes Love, Sharlet RAMAN, PA-C  Difelikefalin Acetate (KORSUVA) 65 MCG/1.3ML SOLN Korsuva (difelikefalin) IVP Dose Patient not taking: Reported on 03/27/2023 01/29/23 04/21/23  [provider]     Vitals:   03/27/23 0757 03/27/23 0804 03/27/23 1138 03/27/23 1521  BP: (!) 133/34 136/79 138/80 121/78  Pulse:  (!) 119 (!) 108 69  Resp: 17  16 16   Temp: (!) 97.5 F (36.4 C)  97.9 F (36.6 C) 98 F (36.7 C)  TempSrc:    Oral  SpO2: 99%  99% 98%  Weight:      Height:       Exam Gen alert, no distress, eldelry lady , pleasant No rash, cyanosis or gangrene Sclera anicteric, throat clear  No jvd or bruits Chest clear bilat to bases, no rales/ wheezing RRR no MRG Abd soft ntnd no mass or ascites +bs GU defer MS no joint effusions or deformity Ext no LE or UE edema, no other edema Neuro is alert, Ox 3 , nf    LUA AVF+bruit/ RIJ TDC      Renal-related home meds: - hydralazine  50 tid prn - toprol  xl 25mg  - midodrine  10mg  tid if sbp < 120 - renvela  800mg  ac tid    OP HD: NW MWF 3.5h   B350   51.4kg 2/2 bath  AVF / TDC  16g  Heparin  none - last HD 2/5 post wt 51.6kg   - very small wt gains, getting to dry wt - hectorol  3 mcg - mircera 225 mcg q 2, last 1/24, due 2/07  Assessment/ Plan: Cellulitis L leg/ foot - failed OP po abx. IV abx started w/ vanc/ zosyn . Per pmd.  ESRD - on HD MWF. Plan is for HD tomorrow.  HTN - BP's are wnl here. Takes  hydralazine  PRN at home. Per pmd.  Volume - no sig vol overload on exam. 2Kg up, UF goal same w/ HD Anemia of esrd - Hb 8- 9 here. Due for esa tomorrow, will order darbe 200 mcg weekly sq while her on Friday.  Secondary hyperparathyroidism - CCa in range, phos a bit high. Cont binders w/ meals.  PAF - takes metoprolol  xl. Last EF 65%, g3DD.       Myer Fret  MD [facility] 03/27/2023, 4:19 PM  Recent Labs  Lab 03/26/23 1519 03/27/23 0622  HGB 8.6* 8.5*  ALBUMIN  2.7* 2.1*  CALCIUM  8.4* 8.7*  PHOS  --  6.9*  CREATININE 2.15* 2.71*  K 3.7 3.5   Inpatient medications:  amiodarone   200 mg Oral Daily   aspirin   81 mg Oral Q T,Th,S,Su   [START ON 03/28/2023] aspirin   81 mg Oral Q M,W,F-2000   Chlorhexidine  Gluconate Cloth  6 each Topical Daily   heparin  injection (subcutaneous)  5,000 Units Subcutaneous Q8H   insulin  aspart  0-6 Units Subcutaneous TID WC   insulin  aspart  2 Units Subcutaneous TID WC   lipase/protease/amylase  36,000 Units Oral TID AC   sevelamer  carbonate  800 mg Oral TID WC   sodium chloride  flush  10-40 mL Intracatheter Q12H   vancomycin  variable dose per unstable renal function (pharmacist dosing)   Does not apply See admin instructions    cefTRIAXone  (ROCEPHIN )  IV     [  START ON 03/28/2023] vancomycin      acetaminophen  **OR** acetaminophen , fentaNYL  (SUBLIMAZE ) injection, melatonin, naLOXone  (NARCAN )  injection, ondansetron  (ZOFRAN ) IV, sevelamer  carbonate, sodium chloride  flush

## 2023-03-27 NOTE — Progress Notes (Signed)
 Orthopedic Tech Progress Note Patient Details:  CHARDAY CAPETILLO 1935/06/16 [id number]  Ortho Devices Type of Ortho Device: Postop shoe/boot Ortho Device/Splint Location: For LLE Ortho Device/Splint Interventions: Ordered   Post Interventions Instructions Provided: Care of device, Adjustment of device  Camilo Mander Ronal Brasil 03/27/2023, 7:46 PM

## 2023-03-27 NOTE — Telephone Encounter (Signed)
 Received fax from Dr Edson Graces due to prolonged bleeding and thinning skin.  Will give to [name] and scan fax into media.

## 2023-03-27 NOTE — Progress Notes (Signed)
 Pharmacy Antibiotic Note  Kim Burns is a 88 y.o. female admitted on 03/26/2023 with left foot cellulitis.  Pharmacy has been consulted for vancomycin  and Zosyn  dosing. Patient is on HD MWF.   Plan:  Zosyn  2.25gm IV Q8H Vancomycin  500mg  post HD MWF Monitor clinical progress, vanc level  2/10 if continuing   Height: 5' 3 (160 cm) Weight: 55.3 kg (122 lb) IBW/kg (Calculated) : 52.4  Temp (24hrs), Avg:98.4 F (36.9 C), Min:97.5 F (36.4 C), Max:100 F (37.8 C)  Recent Labs  Lab 03/26/23 1519 03/26/23 1803 03/27/23 0622  WBC 11.1*  --  10.6*  CREATININE 2.15*  --  2.71*  LATICACIDVEN 2.5* 1.8  --     Estimated Creatinine Clearance: 12.6 mL/min (A) (by C-G formula based on SCr of 2.71 mg/dL (H)).    Allergies  Allergen Reactions   Lopid [Gemfibrozil] Other (See Comments)    Myalgia    Lotemax [Loteprednol Etabonate] Rash    Skin burning  Blurry vision   Statins Other (See Comments)    Rosuvastatin Muscle weakness   Norco [Hydrocodone -Acetaminophen ] Other (See Comments)    Headaches  Tolerates acetaminophen     Other Diarrhea    Real Butter - diarrhea   Vibra -Tab [Doxycycline ] Other (See Comments)    Unknown reaction   Amlodipine Other (See Comments)    Norvasc   Codeine Other (See Comments)    headache   Hydrocodone  Other (See Comments)   Hydrocodone -Acetaminophen  Other (See Comments)    Headache. Tolerates acetaminophen .   Lisinopril Other (See Comments)   Verapamil  Other (See Comments)   Antimicrobials: Vanc 2/5 >> Zosyn  2/5 >>  Microbiology  2.5 Bcx: ngtd     Jinnie Door, PharmD, BCPS, BCCP Clinical Pharmacist  Please check AMION for all [facility] phone numbers After 10:00 PM, call Main Pharmacy [phone]

## 2023-03-27 NOTE — Hospital Course (Addendum)
 Kim Burns is a 88 y.o. female with medical history significant for intraductal papillary mucinous neoplasm of the pancreas status post Whipple procedure in 2018, type 2 diabetes mellitus, paroxysmal atrial fibrillation, chronic diastolic heart failure, end-stage renal disease on hemodialysis on Monday, Wednesday, Friday schedule, presented to the hospital with 2 weeks of redness on the left second toe, foot with some swelling.  She initially presented to [facility] ED where she was noted to be febrile with a temperature of 100 F.  Labs showed hyperglycemia.  Initial lactate was elevated at 2.5 repeat at 1.8.  She did have mild leukocytosis and hemoglobin was 8.6.  Urinalysis showed 11-20 white cells.  Blood cultures were drawn in the ED. COVID, influenza, RSV PCR were all negative.  EKG showed normal sinus rhythm.  X-ray of the left foot showed no evidence of osteomyelitis or foreign body.  Chest x-ray was negative for acute infiltrate.  In the ED patient received IV vancomycin  and Zosyn  normal saline 500 mL bolus and was admitted hospital for left foot cellulitis failed outpatient treatment.  Assessment and plan.    Cellulitis of the left foot:  Patient with history of failure to outpatient antibiotic with Keflex .  Febrile and leukocytosis in the ED.  Follow cultures.  Has been started on vancomycin  and Zosyn .  Check MRI of the foot.   CRP elevated at 6.2.  Hemoglobin A1c of 5.4.  Blood cultures negative in less than 12 hours.   Lactic Acidosis: Initial lactate of 2.5, with interval decline to 1.8 following interval administration of 500 cc NS bolus.     Asymptomatic Pyuria:  WBC in urine but without urinary symptoms.  Already on antibiotic.  Urine cultures were sent.       Type 2 Diabetes Mellitus: Latest hemoglobin A1c noted to be 6.1% when checked in September 2024.  Continue sliding scale insulin  and Accu-Cheks.  Paroxysmal atrial fibrillation:  Rate controlled at this time.  CHA2DS2-VASc  score of  5, continue metoprolol  succinate, which she takes on Tuesday, Thursday, Saturday and Sunday, associated with her nausea on hemodialysis days.  Continue aspirin , telemetry monitoring.  Recent 2D echocardiogram from December 2024, and notable for LVEF 6065%, no evidence of focal wall motion with grade 3 diastolic dysfunction.      Chronic diastolic heart failure:  Review of latest 2D echocardiogram from December 2024, which is notable for LVEF 60 to 65% as well as grade 3 diastolic dysfunction, currently compensated.  Continue intake and output charting Eliquis .  Volume management as per dialysis.    ESRD: on HD on hemodialysis Monday Wednesday Friday next dialysis session 03/28/2023.  Appears to be compensated.  Nephrology consulted for hemodialysis.  History of intraductal papillary mucinous neoplasm: Documented history of such, status post Whipple procedure in 2018. On Creon  as an outpatient.  Will continue for now.    Anemia of chronic kidney disease: baseline hgb range 7-11,.  Continue to monitor.

## 2023-03-27 NOTE — Consult Note (Signed)
 ORTHOPAEDIC CONSULTATION  REQUESTING PHYSICIAN: Pokhrel, Laxman, MD  Chief Complaint: Patient complains of systemic symptoms and an acute ulcer over the second toe PIP joint left foot HPI: Kim Burns is a 88 y.o. female who presents with Patient is a 88 year old woman end-stage renal disease on dialysis who has had systemic symptoms since this weekend.  By report she did have a fever.  Patient has had blood cultures.  Patient reports an acute ulcer over the second toe PIP joint.  Past Medical History:  Diagnosis Date   Anemia of chronic disease    takes iron   Anxiety    Atrial fibrillation (HCC)    Blood transfusion without reported diagnosis    Bradycardia    Diabetes mellitus without complication (HCC)    became diabetic after Whipple procedure   Diarrhea    Dysrhythmia 04/16/2016   bradycardia due to medication    ESRD on hemodialysis [facility])    M-W-F   GERD (gastroesophageal reflux disease)    Gout    Headache    History of kidney stones 06/2013   Hyperparathyroidism (HCC)    Hypertension    PONV (postoperative nausea and vomiting)    Sleep apnea    no cpap machine. could not tolerate   Stroke (HCC) 04/16/2016   TIA 1995   Vitamin D  deficiency    Past Surgical History:  Procedure Laterality Date   A/V FISTULAGRAM Left 08/30/2021   Procedure: A/V Fistulagram;  Surgeon: Gretta Lonni PARAS, MD;  Location: [facility] INVASIVE CV LAB;  Service: Cardiovascular;  Laterality: Left;   A/V FISTULAGRAM N/A 02/25/2023   Procedure: A/V Fistulagram;  Surgeon: Sheree Penne Lonni, MD;  Location: [facility] INVASIVE CV LAB;  Service: Vascular;  Laterality: N/A;   ABDOMINAL HYSTERECTOMY  1985   complete   BACK SURGERY  1980   lower   BASCILIC VEIN TRANSPOSITION Left 04/24/2017   Procedure: LEFT ARM FIRST STAGE BASILIC VEIN TRANSPOSITION;  Surgeon: Serene Gaile ORN, MD;  Location: [facility] OR;  Service: Vascular;  Laterality: Left;   BASCILIC VEIN TRANSPOSITION Left 07/10/2017    Procedure: SECOND STAGE BASILIC VEIN TRANSPOSITION LEFT ARM;  Surgeon: Serene Gaile ORN, MD;  Location: [facility] OR;  Service: Vascular;  Laterality: Left;   BREAST LUMPECTOMY Left x 2   many years apart, benign   CHOLECYSTECTOMY  1985   CYSTOSCOPY W/ URETERAL STENT PLACEMENT Left 10/31/2021   Procedure: CYSTOSCOPY WITH RETROGRADE PYELOGRAM/URETERAL STENT PLACEMENT;  Surgeon: Cam Morene ORN, MD;  Location: [facility] OR;  Service: Urology;  Laterality: Left;   CYSTOSCOPY WITH URETEROSCOPY AND STENT PLACEMENT Left 06/18/2013   Procedure: CYSTOSCOPY WITH Lef URETEROSCOPY AND Left STENT PLACEMENT;  Surgeon: Noretta Ferrara, MD;  Location: [facility] ORS;  Service: Urology;  Laterality: Left;   DIALYSIS/PERMA CATHETER INSERTION N/A 03/13/2023   Procedure: DIALYSIS/PERMA CATHETER INSERTION;  Surgeon: Tobie Gordy POUR, MD;  Location: [facility] INVASIVE CV LAB;  Service: Cardiovascular;  Laterality: N/A;   ESOPHAGOGASTRODUODENOSCOPY (EGD) WITH PROPOFOL  N/A 11/13/2016   Procedure: ESOPHAGOGASTRODUODENOSCOPY (EGD) WITH PROPOFOL ;  Surgeon: Elicia Claw, MD;  Location: [facility] ENDOSCOPY;  Service: Gastroenterology;  Laterality: N/A;   EUS N/A 02/07/2016   Procedure: ESOPHAGEAL ENDOSCOPIC ULTRASOUND (EUS) RADIAL;  Surgeon: Elsie Cree, MD;  Location: [facility] ENDOSCOPY;  Service: Endoscopy;  Laterality: N/A;   EYE SURGERY Bilateral 2014   ioc for catracts    FOOT SURGERY Left 1990   something with toes unsure what    HERNIA REPAIR  2018   IR CATHETER TUBE CHANGE  03/14/2022   IR EMBO TUMOR ORGAN ISCHEMIA INFARCT INC GUIDE ROADMAPPING  08/22/2021   IR NEPHROSTOMY EXCHANGE LEFT  06/27/2022   IR NEPHROSTOMY EXCHANGE LEFT  08/26/2022   IR NEPHROSTOMY EXCHANGE LEFT  10/22/2022   IR NEPHROSTOMY EXCHANGE LEFT  12/16/2022   IR NEPHROSTOMY EXCHANGE LEFT  12/30/2022   IR NEPHROSTOMY EXCHANGE LEFT  02/10/2023   IR NEPHROSTOMY PLACEMENT LEFT  03/14/2022   IR RADIOLOGIST EVAL & MGMT  05/18/2021   IR RADIOLOGIST EVAL & MGMT  10/01/2021   IR  RADIOLOGIST EVAL & MGMT  11/12/2021   IR RADIOLOGIST EVAL & MGMT  11/22/2021   IR RADIOLOGIST EVAL & MGMT  11/27/2021   IR RADIOLOGIST EVAL & MGMT  12/20/2021   IR RENAL SUPRASEL UNI S&I MOD SED  08/22/2021   IR US  GUIDE VASC ACCESS RIGHT  08/22/2021   LEFT HEART CATH AND CORONARY ANGIOGRAPHY N/A 08/27/2021   Procedure: LEFT HEART CATH AND CORONARY ANGIOGRAPHY;  Surgeon: Verlin Lonni BIRCH, MD;  Location: [facility] INVASIVE CV LAB;  Service: Cardiovascular;  Laterality: N/A;   PARATHYROID  EXPLORATION     PERIPHERAL VASCULAR BALLOON ANGIOPLASTY Left 08/30/2021   Procedure: PERIPHERAL VASCULAR BALLOON ANGIOPLASTY;  Surgeon: Gretta Lonni PARAS, MD;  Location: [facility] INVASIVE CV LAB;  Service: Cardiovascular;  Laterality: Left;  arm fistula   PERIPHERAL VASCULAR BALLOON ANGIOPLASTY Left 02/25/2023   Procedure: PERIPHERAL VASCULAR BALLOON ANGIOPLASTY;  Surgeon: Sheree Penne Lonni, MD;  Location: [facility] INVASIVE CV LAB;  Service: Vascular;  Laterality: Left;  left AVF   RADIOLOGY WITH ANESTHESIA Left 08/22/2021   Procedure: MICROWAVE ABLATION;  Surgeon: Adele Rush, MD;  Location: [facility] ORS;  Service: Anesthesiology;  Laterality: Left;   WHIPPLE PROCEDURE N/A 04/23/2016   Procedure: WHIPPLE PROCEDURE;  Surgeon: Jina Nephew, MD;  Location: [facility] OR;  Service: General;  Laterality: N/A;   Social History   Socioeconomic History   Marital status: Widowed    Spouse name: Not on file   Number of children: Not on file   Years of education: Not on file   Highest education level: Not on file  Occupational History   Not on file  Tobacco Use   Smoking status: Never   Smokeless tobacco: Never  Vaping Use   Vaping status: Never Used  Substance and Sexual Activity   Alcohol  use: No   Drug use: No   Sexual activity: Not on file  Other Topics Concern   Not on file  Social History Narrative   Not on file   Social Drivers of Health   Financial Resource Strain: Low Risk  (01/08/2023)   Overall Financial  Resource Strain (CARDIA)    Difficulty of Paying Living Expenses: Not hard at all  Food Insecurity: No Food Insecurity (03/26/2023)   Hunger Vital Sign    Worried About Running Out of Food in the Last Year: Never true    Ran Out of Food in the Last Year: Never true  Transportation Needs: No Transportation Needs (12/24/2022)   PRAPARE - Administrator, Civil Service (Medical): No    Lack of Transportation (Non-Medical): No  Physical Activity: Inactive (02/06/2023)   Exercise Vital Sign    Days of Exercise per Week: 0 days    Minutes of Exercise per Session: 0 min  Stress: No Stress Concern Present (01/08/2023)   [employer] of Occupational Health - Occupational Stress Questionnaire    Feeling of Stress : Not at all  Social Connections: Socially Isolated (01/08/2023)   Social Connection  and Isolation Panel [NHANES]    Frequency of Communication with Friends and Family: More than three times a week    Frequency of Social Gatherings with Friends and Family: More than three times a week    Attends Religious Services: Never    Database Administrator or Organizations: No    Attends Banker Meetings: Never    Marital Status: Widowed   Family History  Problem Relation Age of Onset   Heart disease Mother    Stroke Mother    Cancer Father        colon   Alzheimer's disease Sister    Alzheimer's disease Sister    Cancer Brother        brain   Breast cancer Neg Hx    - negative except otherwise stated in the family history section Allergies  Allergen Reactions   Lopid [Gemfibrozil] Other (See Comments)    Myalgia    Lotemax [Loteprednol Etabonate] Rash    Skin burning  Blurry vision   Statins Other (See Comments)    Rosuvastatin Muscle weakness   Norco [Hydrocodone -Acetaminophen ] Other (See Comments)    Headaches  Tolerates acetaminophen     Other Diarrhea    Real Butter - diarrhea   Vibra -Tab [Doxycycline ] Other (See Comments)    Unknown reaction    Amlodipine Other (See Comments)    Norvasc   Codeine Other (See Comments)    headache   Hydrocodone  Other (See Comments)   Hydrocodone -Acetaminophen  Other (See Comments)    Headache. Tolerates acetaminophen .   Lisinopril Other (See Comments)   Verapamil  Other (See Comments)   Prior to Admission medications   Medication Sig Start Date End Date Taking? Authorizing Provider  acetaminophen  (TYLENOL ) 325 MG tablet Take 2 tablets (650 mg total) by mouth every 6 (six) hours as needed for mild pain (pain score 1-3), moderate pain (pain score 4-6), fever or headache. 01/02/23  Yes Von Bellis, MD  amantadine  (SYMMETREL ) 100 MG capsule Take 1 capsule (100 mg total) by mouth once a week. 11/20/22  Yes Love, Sharlet RAMAN, PA-C  amiodarone  (PACERONE ) 200 MG tablet Take 1 tablet (200 mg total) by mouth daily. 11/15/22  Yes Love, Sharlet RAMAN, PA-C  aspirin  81 MG chewable tablet Chew 1 tablet (81 mg total) by mouth daily. Patient taking differently: Chew 81 mg by mouth daily. Take 81mg  by mouth every morning except on dialysis days, hold in the morning and take after dialysis. 11/13/22  Yes Love, Sharlet RAMAN, PA-C  Azelastine  HCl 137 MCG/SPRAY SOLN Place 1-2 sprays into both nostrils daily as needed (allergies). 11/29/22  Yes [provider]  B Complex-C-Zn-Folic Acid  (DIALYVITE 800 WITH ZINC ) 0.8 MG TABS Take 1 tablet by mouth daily.   Yes [provider]  cycloSPORINE  (RESTASIS ) 0.05 % ophthalmic emulsion Place 1 drop into both eyes daily as needed (irritation). Patient taking differently: Place 1 drop into both eyes daily as needed (dryness). 11/13/22  Yes Love, Sharlet RAMAN, PA-C  diphenhydramine -acetaminophen  (TYLENOL  PM) 25-500 MG TABS tablet Take 2 tablets by mouth at bedtime.   Yes [provider]  hydrALAZINE  (APRESOLINE ) 50 MG tablet Take 50 mg by mouth every 8 (eight) hours as needed (for BP greater than 160).   Yes [provider]  insulin  aspart (NOVOLOG  FLEXPEN) 100  UNIT/ML FlexPen Inject 5-7 Units into the skin 3 (three) times daily with meals. Sliding scale.   Yes [provider]  lidocaine -prilocaine  (EMLA ) cream Apply 1 Application topically every Monday, Wednesday, and  Friday with hemodialysis.   Yes [provider]  lipase/protease/amylase (CREON ) 36000 UNITS CPEP capsule Take 36,000 Units by mouth See admin instructions. Take 36,000 units (1 capsule) by mouth with meals and snacks.   Yes [provider]  meclizine  (ANTIVERT ) 25 MG tablet Take 1 tablet (25 mg total) by mouth 3 (three) times daily as needed for dizziness. 01/02/23 01/02/24 Yes Von Bellis, MD  metoprolol  succinate (TOPROL -XL) 25 MG 24 hr tablet Take 1 tablet (25 mg total) by mouth 3 (three) times a week. Take on nondialysis days, Tuesday, Thursday, Saturday and Sunday if systolic BP greater than 130 mmHg 01/03/23  Yes Von Bellis, MD  midodrine  (PROAMATINE ) 10 MG tablet Take 1 tablet (10 mg total) by mouth See admin instructions. Take 10mg  by mouth before dialysis. Bring with you to dialysis in case another dose is required. May take 10 mg 3 times a day if systolic BP less than 120 mmHg 01/02/23 01/02/24 Yes Von Bellis, MD  olopatadine  (PATADAY ) 0.1 % ophthalmic solution Place 1 drop into both eyes 2 (two) times daily as needed for allergies.   Yes [provider]  rOPINIRole  (REQUIP ) 0.25 MG tablet Take 1 tablet (0.25 mg total) by mouth 2 (two) times daily. Patient taking differently: Take 0.25-0.5 mg by mouth See admin instructions. Take 0.5mg  (1 tablet) by mouth every night at bedtime and 0.25mg  (1/2 tablet) during the day as needed. 09/24/22  Yes Sebastian Toribio GAILS, MD  sevelamer  carbonate (RENVELA ) 800 MG tablet Take 1 tablet (800 mg total) by mouth 3 (three) times daily with meals. Patient taking differently: Take 800 mg by mouth See admin instructions. Take 800mg  by mouth with meals or snacks. 11/13/22  Yes Love, Sharlet RAMAN, PA-C  Difelikefalin  Acetate (KORSUVA) 65 MCG/1.3ML SOLN Korsuva (difelikefalin) IVP Dose Patient not taking: Reported on 03/27/2023 01/29/23 04/21/23  [provider]   MR FOOT LEFT WO CONTRAST Result Date: 03/27/2023 CLINICAL DATA:  Foot erythema. Swelling associated with the second toe of the left foot. Chronic foot pain. Diabetes. Clinical concern for osteomyelitis. EXAM: MRI OF THE LEFT FOOT WITHOUT CONTRAST TECHNIQUE: Multiplanar, multisequence MR imaging of the left forefoot was performed. No intravenous contrast was administered. COMPARISON:  left foot radiographs 03/26/2023, 04/19/2021 FINDINGS: Despite efforts by the technologist and patient, mild-to-moderate motion artifact is present on today's exam and could not be eliminated. This reduces exam sensitivity and specificity. Bones/Joint/Cartilage There is metallic artifact from a pin within the distal first metatarsal and a screw within the second metatarsal head, obscuring the surrounding regions. There is moderate focal marrow edema within the distal aspect of the proximal phalanx of the second toe (sagittal series 11, image 13, coronal series 9, image 17). Mild marrow edema within the distal third of the great toe (sagittal series 11, image 10 and coronal series 9, image 15). Moderately intense small region of edema within the distal aspect of the distal phalanx of the third toe (sagittal series 11, image 17 and coronal series 9, image 17). There is mild marrow edema seen throughout the congenitally fused middle and distal phalanges of the fifth toe. Mild-to-moderate hallux valgus. Moderate great toe metatarsophalangeal cartilage thinning,a prominent dorsal subchondral marrow edema, and peripheral osteophytosis osteoarthritis. 5 mm degenerative cyst at the medial aspect of the great toe metatarsal head. Ligaments The Lisfranc ligament complex is intact. The metatarsophalangeal and interphalangeal cartilage elements appear intact. Muscles and Tendons There is  moderate edema throughout the plantar midfoot musculature with mild fluid in between the  flexor digitorum longus tendons and the flexor digitorum brevis muscles and tendons (axial series 8, image 40). Soft tissues Motion artifact and inhomogeneous fat saturation limits evaluation of the toe soft tissues. There is mild edema and soft tissue swelling of the second toe. Probable mild edema and swelling of the distal aspect of the rest of the toes as well. No significant ulceration is identified. IMPRESSION: 1. Motion artifact and metallic susceptibility artifact limited examination. 2. There is soft tissue swelling of the second toe. There is moderate focal marrow edema within the distal aspect of the proximal phalanx of the second toe. This is nonspecific and may be reactive or secondary to early osteomyelitis. Recommend clinical correlation for concern for infection of the second toe. 3. Mild marrow edema within the distal third of the great toe, moderately intense small region of edema within the distal aspect of the distal phalanx of the third toe, and mild marrow edema throughout the congenitally fused middle and distal phalanges of the fifth toe. The technical limitations of the study limits evaluation for soft tissue ulceration recommend clinical correlation for concern for infection within these toes, which would raise concern for the marrow edema representing early osteomyelitis. 4. Mild-to-moderate hallux valgus. Moderate great toe metatarsophalangeal osteoarthritis. Electronically Signed   By: Tanda Lyons M.D.   On: 03/27/2023 14:20   DG Foot Complete Left Result Date: 03/26/2023 CLINICAL DATA:  wound to 2nd toe. EXAM: LEFT FOOT - COMPLETE 3+ VIEW COMPARISON:  04/19/2021. FINDINGS: No acute fracture or dislocation. Please note evaluation of the distal phalanx of second toe is limited due to overlapping with the adjacent toes. However, no discrete focal bone destruction/erosion noted. Mild-to-moderate  hallux valgus deformity noted. Metallic K-wire/screws noted overlying the distal portion of the first and second metatarsals, similar to the prior study. Ankle mortise appears intact. No focal soft tissue swelling. No evidence of air within soft tissue. No radiopaque foreign bodies. IMPRESSION: No radiographic evidence of acute osteomyelitis involving the second toe. Correlate clinically to determine the need for additional imaging with MRI. Electronically Signed   By: Ree Molt M.D.   On: 03/26/2023 16:07   DG Chest Port 1 View Result Date: 03/26/2023 CLINICAL DATA:  Questionable sepsis - evaluate for abnormality. Fever. Headache. EXAM: PORTABLE CHEST 1 VIEW COMPARISON:  01/10/2023. FINDINGS: Low lung volume. There are probable atelectatic changes in the left lung base. Bilateral lung fields are otherwise clear. No dense consolidation or lung collapse. No pulmonary edema. Bilateral costophrenic angles are clear. Stable cardio-mediastinal silhouette. No acute osseous abnormalities. The soft tissues are within normal limits. Right-sided central venous catheter noted with its tip overlying the right atrium region. There is an additional tubing overlying the left upper abdomen. IMPRESSION: No active disease. Electronically Signed   By: Ree Molt M.D.   On: 03/26/2023 16:03   - pertinent xrays, CT, MRI studies were reviewed and independently interpreted  Positive ROS: All other systems have been reviewed and were otherwise negative with the exception of those mentioned in the HPI and as above.  Physical Exam: General: Alert, no acute distress Psychiatric: Patient is competent for consent with normal mood and affect Lymphatic: No axillary or cervical lymphadenopathy Cardiovascular: No pedal edema Respiratory: No cyanosis, no use of accessory musculature GI: No organomegaly, abdomen is soft and non-tender    Images:  @ENCIMAGES @  Labs:  Lab Results  Component Value Date   HGBA1C 5.4  03/27/2023   HGBA1C 6.1 (H) 10/27/2022   HGBA1C 5.8 (H)  09/14/2022   ESRSEDRATE 85 (H) 03/27/2023   ESRSEDRATE 33 (H) 03/05/2019   CRP 6.2 (H) 03/27/2023   CRP 0.9 03/05/2019   REPTSTATUS PENDING 03/26/2023   GRAMSTAIN  11/02/2021    ABUNDANT WBC PRESENT,BOTH PMN AND MONONUCLEAR NO ORGANISMS SEEN    CULT  03/26/2023    NO GROWTH < 12 HOURS Performed at [facility], [address], [place], [place] [address]     Lab Results  Component Value Date   ALBUMIN  2.1 (L) 03/27/2023   ALBUMIN  2.7 (L) 03/26/2023   ALBUMIN  2.4 (L) 12/31/2022        Latest Ref Rng & Units 03/27/2023    6:22 AM 03/26/2023    3:19 PM 02/18/2023   12:02 PM  CBC EXTENDED  WBC 4.0 - 10.5 K/uL 10.6  11.1    RBC 3.87 - 5.11 MIL/uL 2.93  3.03    Hemoglobin 12.0 - 15.0 g/dL 8.5  8.6  88.0   HCT 63.9 - 46.0 % 27.9  29.3  35.0   Platelets 150 - 400 K/uL 238  225    NEUT# 1.7 - 7.7 K/uL 9.0  10.0    Lymph# 0.7 - 4.0 K/uL 0.6  0.5      Neurologic: Patient does not have protective sensation bilateral lower extremities.   MUSCULOSKELETAL:   Skin: Examination patient does have a palpable dorsalis pedis pulse.  There is no ascending cellulitis of the left foot no sausage digit swelling of the toes.  Patient does have a ulcer over the medial border of the PIP joint of the left second toe with about a millimeter of exposed bone.  No surrounding cellulitis or sausage digit swelling.  Review of the MRI scan does show edema at the PIP joint consistent with early osteomyelitis.  There is edema of the other joints which has the appearance more consistent with gout.  Patient states she has had a history of gout.  Her GFR is less than 30 on dialysis.  White cell count 10.6.  Sed rate 85.  C-reactive protein 6.2.  Assessment: Assessment: End-stage renal disease with history of gout and acute osteomyelitis of the PIP joint of the second toe with no signs of abscess.  Plan: Order was placed for a postoperative  shoe.  Patient should apply a dry dressing daily.  Anticipate patient could be discharged on oral antibiotics such as doxycycline .  I will follow-up in the office in 2 weeks.  Thank you for the consult and the opportunity to see Ms. Shlomo Jerona Sage, MD [facility] [phone] 5:27 PM

## 2023-03-28 DIAGNOSIS — L03116 Cellulitis of left lower limb: Secondary | ICD-10-CM | POA: Diagnosis not present

## 2023-03-28 LAB — BASIC METABOLIC PANEL
Anion gap: 16 — ABNORMAL HIGH (ref 5–15)
BUN: 45 mg/dL — ABNORMAL HIGH (ref 8–23)
CO2: 24 mmol/L (ref 22–32)
Calcium: 9 mg/dL (ref 8.9–10.3)
Chloride: 98 mmol/L (ref 98–111)
Creatinine, Ser: 4.05 mg/dL — ABNORMAL HIGH (ref 0.44–1.00)
GFR, Estimated: 10 mL/min — ABNORMAL LOW (ref >60)
Glucose, Bld: 217 mg/dL — ABNORMAL HIGH (ref 70–99)
Potassium: 3.9 mmol/L (ref 3.5–5.1)
Sodium: 138 mmol/L (ref 135–145)

## 2023-03-28 LAB — CBC
HCT: 26.7 % — ABNORMAL LOW (ref 36.0–46.0)
Hemoglobin: 8 g/dL — ABNORMAL LOW (ref 12.0–15.0)
MCH: 28.8 pg (ref 26.0–34.0)
MCHC: 30 g/dL (ref 30.0–36.0)
MCV: 96 fL (ref 80.0–100.0)
Platelets: 256 10*3/uL (ref 150–400)
RBC: 2.78 MIL/uL — ABNORMAL LOW (ref 3.87–5.11)
RDW: 19.2 % — ABNORMAL HIGH (ref 11.5–15.5)
WBC: 10.2 10*3/uL (ref 4.0–10.5)
nRBC: 0 % (ref 0.0–0.2)

## 2023-03-28 LAB — GLUCOSE, CAPILLARY
Glucose-Capillary: 224 mg/dL — ABNORMAL HIGH (ref 70–99)
Glucose-Capillary: 146 mg/dL — ABNORMAL HIGH (ref 70–99)
Glucose-Capillary: 197 mg/dL — ABNORMAL HIGH (ref 70–99)
Glucose-Capillary: 127 mg/dL — ABNORMAL HIGH (ref 70–99)

## 2023-03-28 LAB — MAGNESIUM: Magnesium: 2.6 mg/dL — ABNORMAL HIGH (ref 1.7–2.4)

## 2023-03-28 LAB — URIC ACID: Uric Acid, Serum: 6.1 mg/dL (ref 2.5–7.1)

## 2023-03-28 MED ORDER — METOPROLOL TARTRATE 5 MG/5ML IV SOLN
2.5000 mg | Freq: Four times a day (QID) | INTRAVENOUS | Status: DC | PRN
  Administered 2023-03-28 – 2023-03-29 (×2): 2.5 mg via INTRAVENOUS
  Filled 2023-03-28 (×3): qty 5

## 2023-03-28 MED ORDER — POLYVINYL ALCOHOL 1.4 % OP SOLN
1.0000 [drp] | OPHTHALMIC | Status: DC | PRN
  Administered 2023-03-28: 1 [drp] via OPHTHALMIC
  Filled 2023-03-28: qty 15

## 2023-03-28 NOTE — Progress Notes (Signed)
 [facility] Progress Note   Subjective:    Seen and examined patient on HD. Tolerating UFG 2L. Noted history of PAF and HR ranging 110-120s. BP is 117/85. She reports mild SOB overnight and denies CP, palpitations, and N/V.   Objective Vitals:   03/28/23 0830 03/28/23 0900 03/28/23 0930 03/28/23 1000  BP: (!) 139/97 137/83 117/85 (!) 142/83  Pulse: (!) 108 (!) 111 88 94  Resp: (!) 33 (!) 25 (!) 27 (!) 30  Temp:      TempSrc:      SpO2: 100% 100% 99% 97%  Weight:      Height:       Physical Exam General: Elderly woman; frail; awake, alert, on RA, NAD Heart: Irregular; No murmurs, gallops, or rubs Lungs: Clear anteriorly. Breathing unlabored Abdomen: Soft and non-tender Extremities: No LE edema Dialysis Access: L AVF (+) B/T, mild ecchymosis above access; RIJ TDC-in use   Filed Weights   03/26/23 2330 03/28/23 0500 03/28/23 0748  Weight: 55.3 kg 55.3 kg 53.5 kg    Intake/Output Summary (Last 24 hours) at 03/28/2023 1026 Last data filed at 03/27/2023 2100 Gross per 24 hour  Intake 250 ml  Output 0 ml  Net 250 ml    Additional Objective Labs: Basic Metabolic Panel: Recent Labs  Lab 03/26/23 1519 03/27/23 0622 03/28/23 0600  NA 139 138 138  K 3.7 3.5 3.9  CL 96* 100 98  CO2 33* 26 24  GLUCOSE 228* 142* 217*  BUN 19 27* 45*  CREATININE 2.15* 2.71* 4.05*  CALCIUM  8.4* 8.7* 9.0  PHOS  --  6.9*  --    Liver Function Tests: Recent Labs  Lab 03/26/23 1519 03/27/23 0622  AST 36 27  ALT 56* 50*  ALKPHOS 98 91  BILITOT 0.4 0.7  PROT 6.4* 6.0*  ALBUMIN  2.7* 2.1*   No results for input(s): LIPASE, AMYLASE in the last 168 hours. CBC: Recent Labs  Lab 03/26/23 1519 03/27/23 0622 03/28/23 0600  WBC 11.1* 10.6* 10.2  NEUTROABS 10.0* 9.0*  --   HGB 8.6* 8.5* 8.0*  HCT 29.3* 27.9* 26.7*  MCV 96.7 95.2 96.0  PLT 225 238 256   Blood Culture    Component Value Date/Time   SDES  03/26/2023 1810    BLOOD BLOOD RIGHT FOREARM Performed at  [facility], [address] [place], [place] [address]    [facility]  03/26/2023 1810    BOTTLES DRAWN AEROBIC ONLY Blood Culture adequate volume Performed at [facility], [address], [place], [place] [address]    CULT  03/26/2023 1810    NO GROWTH < 24 HOURS Performed at [facility], [address], [place], [place] [address]    REPTSTATUS PENDING 03/26/2023 1810    Cardiac Enzymes: No results for input(s): CKTOTAL, CKMB, CKMBINDEX, TROPONINI in the last 168 hours. CBG: Recent Labs  Lab 03/26/23 2256 03/27/23 0619 03/27/23 1130 03/27/23 1643 03/27/23 2043  GLUCAP 245* 137* 229* 157* 196*   Iron Studies: No results for input(s): IRON, TIBC, TRANSFERRIN, FERRITIN in the last 72 hours. Lab Results  Component Value Date   INR 1.1 03/26/2023   INR 1.0 03/14/2022   INR 1.4 (H) 01/22/2022   Studies/Results: MR FOOT LEFT WO CONTRAST Result Date: 03/27/2023 CLINICAL DATA:  Foot erythema. Swelling associated with the second toe of the left foot. Chronic foot pain. Diabetes. Clinical concern for osteomyelitis. EXAM: MRI OF THE LEFT FOOT WITHOUT CONTRAST TECHNIQUE: Multiplanar, multisequence MR imaging of  the left forefoot was performed. No intravenous contrast was administered. COMPARISON:  left foot radiographs 03/26/2023, 04/19/2021 FINDINGS: Despite efforts by the technologist and patient, mild-to-moderate motion artifact is present on today's exam and could not be eliminated. This reduces exam sensitivity and specificity. Bones/Joint/Cartilage There is metallic artifact from a pin within the distal first metatarsal and a screw within the second metatarsal head, obscuring the surrounding regions. There is moderate focal marrow edema within the distal aspect of the proximal phalanx of the second toe (sagittal series 11, image 13, coronal series 9, image 17). Mild marrow edema within the distal third of the great toe  (sagittal series 11, image 10 and coronal series 9, image 15). Moderately intense small region of edema within the distal aspect of the distal phalanx of the third toe (sagittal series 11, image 17 and coronal series 9, image 17). There is mild marrow edema seen throughout the congenitally fused middle and distal phalanges of the fifth toe. Mild-to-moderate hallux valgus. Moderate great toe metatarsophalangeal cartilage thinning,a prominent dorsal subchondral marrow edema, and peripheral osteophytosis osteoarthritis. 5 mm degenerative cyst at the medial aspect of the great toe metatarsal head. Ligaments The Lisfranc ligament complex is intact. The metatarsophalangeal and interphalangeal cartilage elements appear intact. Muscles and Tendons There is moderate edema throughout the plantar midfoot musculature with mild fluid in between the flexor digitorum longus tendons and the flexor digitorum brevis muscles and tendons (axial series 8, image 40). Soft tissues Motion artifact and inhomogeneous fat saturation limits evaluation of the toe soft tissues. There is mild edema and soft tissue swelling of the second toe. Probable mild edema and swelling of the distal aspect of the rest of the toes as well. No significant ulceration is identified. IMPRESSION: 1. Motion artifact and metallic susceptibility artifact limited examination. 2. There is soft tissue swelling of the second toe. There is moderate focal marrow edema within the distal aspect of the proximal phalanx of the second toe. This is nonspecific and may be reactive or secondary to early osteomyelitis. Recommend clinical correlation for concern for infection of the second toe. 3. Mild marrow edema within the distal third of the great toe, moderately intense small region of edema within the distal aspect of the distal phalanx of the third toe, and mild marrow edema throughout the congenitally fused middle and distal phalanges of the fifth toe. The technical  limitations of the study limits evaluation for soft tissue ulceration recommend clinical correlation for concern for infection within these toes, which would raise concern for the marrow edema representing early osteomyelitis. 4. Mild-to-moderate hallux valgus. Moderate great toe metatarsophalangeal osteoarthritis. Electronically Signed   By: Tanda Lyons M.D.   On: 03/27/2023 14:20   DG Foot Complete Left Result Date: 03/26/2023 CLINICAL DATA:  wound to 2nd toe. EXAM: LEFT FOOT - COMPLETE 3+ VIEW COMPARISON:  04/19/2021. FINDINGS: No acute fracture or dislocation. Please note evaluation of the distal phalanx of second toe is limited due to overlapping with the adjacent toes. However, no discrete focal bone destruction/erosion noted. Mild-to-moderate hallux valgus deformity noted. Metallic K-wire/screws noted overlying the distal portion of the first and second metatarsals, similar to the prior study. Ankle mortise appears intact. No focal soft tissue swelling. No evidence of air within soft tissue. No radiopaque foreign bodies. IMPRESSION: No radiographic evidence of acute osteomyelitis involving the second toe. Correlate clinically to determine the need for additional imaging with MRI. Electronically Signed   By: Ree Molt M.D.   On: 03/26/2023 16:07  DG Chest Port 1 View Result Date: 03/26/2023 CLINICAL DATA:  Questionable sepsis - evaluate for abnormality. Fever. Headache. EXAM: PORTABLE CHEST 1 VIEW COMPARISON:  01/10/2023. FINDINGS: Low lung volume. There are probable atelectatic changes in the left lung base. Bilateral lung fields are otherwise clear. No dense consolidation or lung collapse. No pulmonary edema. Bilateral costophrenic angles are clear. Stable cardio-mediastinal silhouette. No acute osseous abnormalities. The soft tissues are within normal limits. Right-sided central venous catheter noted with its tip overlying the right atrium region. There is an additional tubing overlying the left  upper abdomen. IMPRESSION: No active disease. Electronically Signed   By: Ree Molt M.D.   On: 03/26/2023 16:03    Medications:  cefTRIAXone  (ROCEPHIN )  IV 2 g (03/27/23 1700)   vancomycin       amiodarone   200 mg Oral Daily   aspirin   81 mg Oral Q T,Th,S,Su   aspirin   81 mg Oral Q M,W,F-2000   Chlorhexidine  Gluconate Cloth  6 each Topical Daily   Chlorhexidine  Gluconate Cloth  6 each Topical Q0600   darbepoetin (ARANESP ) injection - DIALYSIS  200 mcg Subcutaneous Q Thu-1800   heparin  injection (subcutaneous)  5,000 Units Subcutaneous Q8H   insulin  aspart  0-6 Units Subcutaneous TID WC   insulin  aspart  2 Units Subcutaneous TID WC   lipase/protease/amylase  36,000 Units Oral TID AC   [START ON 03/29/2023] metoprolol  succinate  25 mg Oral Once per day on Sunday Tuesday Thursday Saturday   sevelamer  carbonate  800 mg Oral TID WC   sodium chloride  flush  10-40 mL Intracatheter Q12H   vancomycin  variable dose per unstable renal function (pharmacist dosing)   Does not apply See admin instructions    Dialysis Orders: [facility] M[facility] 3.5h   B350   51.4kg 2/2 bath  AVF / TDC  16g  Heparin  none - last HD 2/5 post wt 51.6kg   - very small wt gains, getting to dry wt - hectorol  3 mcg - mircera 225 mcg q 2, last 1/24, due 2/07  Renal-related home meds: - hydralazine  50 tid prn - toprol  xl 25mg  - midodrine  10mg  tid if sbp < 120 - renvela  800mg  ac tid  Assessment/Plan: Cellulitis L leg/ foot - failed OP po abx. IV abx started w/ vanc/ rocephin . Per pmd. Reviewed Dr. Crist note: post-op shoe placed and recommends discharge on oral ABXs. Ortho plans to schedule f/u in 2 weeks. ESRD - on HD MWF. On HD.  HTN - BP's are wnl here. Takes hydralazine  PRN at home. Per pmd.  Volume - no sig vol overload on exam. 2Kg up, UF goal same w/ HD Anemia of esrd - Hb 8- 9 here. Due for esa tomorrow, ordered darbe 200 mcg weekly-given 2/7.  Secondary hyperparathyroidism - CCa in range, phos a bit high. Cont  binders w/ meals.  PAF - takes metoprolol  xl. Last EF 65%, g3DD.   Charmaine Piety, NP [facility] 03/28/2023,10:26 AM  LOS: 2 days

## 2023-03-28 NOTE — Progress Notes (Signed)
   03/28/23 1125  Vitals  Temp (!) 97.1 F (36.2 C)  Pulse Rate (!) 111  Resp (!) 24  BP 128/78  SpO2 100 %  O2 Device Room Air  Weight 51 kg  Type of Weight Post-Dialysis  Oxygen Therapy  Pulse Oximetry Type Continuous  Post Treatment  Dialyzer Clearance Clear  Hemodialysis Intake (mL) 0 mL  Liters Processed 78  Fluid Removed (mL) 2.5 mL  Tolerated HD Treatment Yes  Post-Hemodialysis Comments tx tolerated well ufg acheived   Received patient in bed to unit.  Alert and oriented.  Informed consent signed and in chart.   TX duration:3.25hrs  Patient tolerated well.  Transported back to the room  Alert, without acute distress.  Hand-off given to patient's nurse.   Access used: [facility] Access issues: none  Total UF removed: 2.5L Medication(s) given: none    [facility]

## 2023-03-28 NOTE — Progress Notes (Signed)
 Pt receives out-pt HD at [facility] [place] on MWF. Will assist as needed.   Olivia Canter Renal Navigator [phone]

## 2023-03-28 NOTE — Progress Notes (Addendum)
 PROGRESS NOTE  Kim Burns FMW:[id number] DOB: [birth year] DOA: 03/26/2023 PCP: Kim Almarie [name], MD   LOS: 2 days   Brief narrative:  Kim Burns is a 88 y.o. female with medical history significant for intraductal papillary mucinous neoplasm of the pancreas status post Whipple procedure in 2018, type 2 diabetes mellitus, paroxysmal atrial fibrillation, chronic diastolic heart failure, end-stage renal disease on hemodialysis on Monday, Wednesday, Friday schedule, presented to the hospital with 2 weeks of redness on the left second toe, foot with some swelling.  She initially presented to [facility] ED where she was noted to be febrile with a temperature of 100 F.  Labs showed hyperglycemia.  Initial lactate was elevated at 2.5 repeat at 1.8.  She did have mild leukocytosis and hemoglobin was 8.6.  Urinalysis showed 11-20 white cells.  Blood cultures were drawn in the ED. COVID, influenza, RSV PCR were all negative.  EKG showed normal sinus rhythm.  X-ray of the left foot showed no evidence of osteomyelitis or foreign body.  Chest x-ray was negative for acute infiltrate.  In the ED patient received IV vancomycin  and Zosyn  normal saline 500 mL bolus and was admitted hospital for left foot cellulitis failed outpatient treatment.  Assessment/Plan: Principal Problem:   Cellulitis of left foot Active Problems:   ESRD on dialysis (HCC)   Lactic acidosis   Paroxysmal atrial fibrillation (HCC)   IPMN (intraductal papillary mucinous neoplasm)   DM2 (diabetes mellitus, type 2) (HCC)   Chronic diastolic CHF (congestive heart failure) (HCC)   History of anemia due to chronic kidney disease   Sterile pyuria  Cellulitis of the left lower extremity/foot:  Patient with history of failure to outpatient antibiotic with Keflex .  Febrile and leukocytosis in the ED.  Follow  blood cultures.  Has been started on vancomycin  and Zosyn .     CRP elevated at 6.2 and ESR of 85.[name]  Hemoglobin A1c of 5.4.  Blood  cultures negative in less than 24 hours.  Temperature max of 98.5.[name]  MRI of the foot with some bone marrow edema suggestive of acute osteomyelitis as per orthopedics. Dr Harden recommended oral antibiotic for 2 weeks on discharge with outpatient follow-up with Dr Harden.   Lactic Acidosis: Initial lactate of 2.5, with interval decline to 1.8.  Received IV fluids initially.   Asymptomatic Pyuria:  WBC in urine but without urinary symptoms.  Already on antibiotic.  Urine cultures were sent.       Type 2 Diabetes Mellitus with hyperglycemia:  Latest hemoglobin A1c noted to be 6.1% in September 2024.[name]  Continue sliding scale insulin  and Accu-Cheks.  Paroxysmal atrial fibrillation:  Mild RVR today.  Received IV metoprolol  yesterday.  Might need additional dose today if not controlled.  CHA2DS2-VASc score of  5, continue metoprolol  succinate, which she takes on Tuesday, Thursday, Saturday and Sunday, associated with her nausea on hemodialysis days.  Continue aspirin , telemetry monitoring.  Recent 2D echocardiogram from December 2024, and notable for LVEF 6065%, no evidence of focal wall motion with grade 3 diastolic dysfunction.      Chronic diastolic heart failure:  Review of latest 2D echocardiogram from December 2024, which is notable for LVEF 60 to 65% as well as grade 3 diastolic dysfunction, currently compensated.  Continue intake and output charting Eliquis .  Volume management as per dialysis.    ESRD: on HD on hemodialysis Monday Wednesday Friday next dialysis session 03/28/2023.  Appears to be compensated.  Nephrology consulted for hemodialysis.  Seen during hemodialysis.  Has percutaneous nephrostomy tube in place.  History of intraductal papillary mucinous neoplasm: Documented history of such, status post Whipple procedure in 2018. On Creon  as an outpatient.  Will continue for now.    Anemia of chronic kidney disease: baseline hgb range 7-11,.  Continue to monitor.  Latest hemoglobin of 8.0 and  has remained stable.  DVT prophylaxis: heparin  injection 5,000 Units Start: 03/27/23 1600 SCDs Start: 03/26/23 2215   Disposition: Likely home in on 03/29/2023 if cultures remain negative and heart rate better controlled.  PT evaluation pending.  Status is: Inpatient  Remains inpatient appropriate because: Cellulitis failed outpatient treatment, IV antibiotic, follow cultures.  Code Status:     Code Status: Full Code  Family Communication:  spoke with the patient's son on the phone on 03/28/2023 regarding disposition  Consultants: Nephrology Orthopedics  Procedures: Hemodialysis  Anti-infectives:  Vanco and Rocephin  IV  Anti-infectives (From admission, onward)    Start     Dose/Rate Route Frequency Ordered Stop   03/28/23 1800  vancomycin  (VANCOREADY) IVPB 500 mg/100 mL        500 mg 100 mL/hr over 60 Minutes Intravenous Every M-W-F (Hemodialysis) 03/27/23 1043     03/27/23 1600  cefTRIAXone  (ROCEPHIN ) 2 g in sodium chloride  0.9 % 100 mL IVPB        2 g 200 mL/hr over 30 Minutes Intravenous Every 24 hours 03/27/23 1501     03/27/23 0800  piperacillin -tazobactam (ZOSYN ) IVPB 2.25 g  Status:  Discontinued        2.25 g 100 mL/hr over 30 Minutes Intravenous Every 8 hours 03/26/23 2231 03/27/23 1501   03/26/23 2231  vancomycin  variable dose per unstable renal function (pharmacist dosing)         Does not apply See admin instructions 03/26/23 2231     03/26/23 2115  vancomycin  (VANCOCIN ) 500 mg in sodium chloride  0.9 % 100 mL IVPB       Placed in Followed by Linked Group   500 mg 100 mL/hr over 60 Minutes Intravenous  Once 03/26/23 2012 03/27/23 0131   03/26/23 2015  piperacillin -tazobactam (ZOSYN ) IVPB 2.25 g        2.25 g 100 mL/hr over 30 Minutes Intravenous  Once 03/26/23 2012 03/26/23 2358   03/26/23 1945  vancomycin  (VANCOCIN ) IVPB 1000 mg/200 mL premix       Placed in Followed by Linked Group   1,000 mg 200 mL/hr over 60 Minutes Intravenous  Once 03/26/23 2012  03/26/23 2129       Subjective:  Today, patient was seen and examined at bedside during hemodialysis.  Patient complains of generalized weakness and palpitations states that her heart rate is increased.  Denies any nausea, vomiting, fever, chills or rigor  Objective: Vitals:   03/28/23 1122 03/28/23 1125  BP: 121/70 128/78  Pulse: (!) 114 (!) 111  Resp: (!) 25 (!) 24  Temp:  (!) 97.1 F (36.2 C)  SpO2: 100% 100%    Intake/Output Summary (Last 24 hours) at 03/28/2023 1144 Last data filed at 03/28/2023 1125 Gross per 24 hour  Intake 250 ml  Output 2.5 ml  Net 247.5 ml   Filed Weights   03/28/23 0500 03/28/23 0748 03/28/23 1125  Weight: 55.3 kg 53.5 kg 51 kg   Body mass index is 19.92 kg/m.   Physical Exam:  GENERAL: Patient is alert awake and Communicative, impaired memory, thinly built HENT: No scleral pallor or icterus. Pupils equally reactive to light. Oral mucosa is moist NECK: is  supple, no gross swelling noted.  Right internal jugular hemodialysis catheter. CHEST: Clear to auscultation. No crackles or wheezes.  CVS: S1 and S2 heard, no murmur.  Irregularly irregular rhythm with tachycardia.. ABDOMEN: Soft, non-tender, bowel sounds are present.  Left nephrostomy tube in place. EXTREMITIES: Left lower extremity with foot erythema with small wound over the left second toe, covered with dressing.  Left upper extremity fistula in place. CNS: Cranial nerves are intact. No focal motor deficits. SKIN: warm and dry   Data Review: I have personally reviewed the following laboratory data and studies,  CBC: Recent Labs  Lab 03/26/23 1519 03/27/23 0622 03/28/23 0600  WBC 11.1* 10.6* 10.2  NEUTROABS 10.0* 9.0*  --   HGB 8.6* 8.5* 8.0*  HCT 29.3* 27.9* 26.7*  MCV 96.7 95.2 96.0  PLT 225 238 256   Basic Metabolic Panel: Recent Labs  Lab 03/26/23 1519 03/27/23 0622 03/28/23 0600  NA 139 138 138  K 3.7 3.5 3.9  CL 96* 100 98  CO2 33* 26 24  GLUCOSE 228* 142* 217*   BUN 19 27* 45*  CREATININE 2.15* 2.71* 4.05*  CALCIUM  8.4* 8.7* 9.0  MG  --  2.3 2.6*  PHOS  --  6.9*  --    Liver Function Tests: Recent Labs  Lab 03/26/23 1519 03/27/23 0622  AST 36 27  ALT 56* 50*  ALKPHOS 98 91  BILITOT 0.4 0.7  PROT 6.4* 6.0*  ALBUMIN  2.7* 2.1*   No results for input(s): LIPASE, AMYLASE in the last 168 hours. No results for input(s): AMMONIA in the last 168 hours. Cardiac Enzymes: No results for input(s): CKTOTAL, CKMB, CKMBINDEX, TROPONINI in the last 168 hours. BNP (last 3 results) Recent Labs    09/14/22 0512 12/23/22 2331  BNP 1,469.0* 1,423.9*    ProBNP (last 3 results) No results for input(s): PROBNP in the last 8760 hours.  CBG: Recent Labs  Lab 03/26/23 2256 03/27/23 0619 03/27/23 1130 03/27/23 1643 03/27/23 2043  GLUCAP 245* 137* 229* 157* 196*   Recent Results (from the past 240 hours)  Resp panel by RT-PCR (RSV, Flu A&B, Covid) Anterior Nasal Swab     Status: None   Collection Time: 03/26/23  3:09 PM   Specimen: Anterior Nasal Swab  Result Value Ref Range Status   SARS Coronavirus 2 by RT PCR NEGATIVE NEGATIVE Final    Comment: (NOTE) SARS-CoV-2 target nucleic acids are NOT DETECTED.  The SARS-CoV-2 RNA is generally detectable in upper respiratory specimens during the acute phase of infection. The lowest concentration of SARS-CoV-2 viral copies this assay can detect is 138 copies/mL. A negative result does not preclude SARS-Cov-2 infection and should not be used as the sole basis for treatment or other patient management decisions. A negative result may occur with  improper specimen collection/handling, submission of specimen other than nasopharyngeal swab, presence of viral mutation(s) within the areas targeted by this assay, and inadequate number of viral copies(<138 copies/mL). A negative result must be combined with clinical observations, patient history, and epidemiological information. The expected  result is Negative.  Fact Sheet for Patients:  bloggercourse.com  Fact Sheet for Healthcare Providers:  seriousbroker.it  This test is no t yet approved or cleared by the [place]  FDA and  has been authorized for detection and/or diagnosis of SARS-CoV-2 by FDA under an Emergency Use Authorization (EUA). This EUA will remain  in effect (meaning this test can be used) for the duration of the COVID-19 declaration under Section 564(b)(1) of the  Act, 21 U.S.C.section 360bbb-3(b)(1), unless the authorization is terminated  or revoked sooner.       Influenza A by PCR NEGATIVE NEGATIVE Final   Influenza B by PCR NEGATIVE NEGATIVE Final    Comment: (NOTE) The Xpert Xpress SARS-CoV-2/FLU/RSV plus assay is intended as an aid in the diagnosis of influenza from Nasopharyngeal swab specimens and should not be used as a sole basis for treatment. Nasal washings and aspirates are unacceptable for Xpert Xpress SARS-CoV-2/FLU/RSV testing.  Fact Sheet for Patients: bloggercourse.com  Fact Sheet for Healthcare Providers: seriousbroker.it  This test is not yet approved or cleared by the [place]  FDA and has been authorized for detection and/or diagnosis of SARS-CoV-2 by FDA under an Emergency Use Authorization (EUA). This EUA will remain in effect (meaning this test can be used) for the duration of the COVID-19 declaration under Section 564(b)(1) of the Act, 21 U.S.C. section 360bbb-3(b)(1), unless the authorization is terminated or revoked.     Resp Syncytial Virus by PCR NEGATIVE NEGATIVE Final    Comment: (NOTE) Fact Sheet for Patients: bloggercourse.com  Fact Sheet for Healthcare Providers: seriousbroker.it  This test is not yet approved or cleared by the [place]  FDA and has been authorized for detection and/or diagnosis of  SARS-CoV-2 by FDA under an Emergency Use Authorization (EUA). This EUA will remain in effect (meaning this test can be used) for the duration of the COVID-19 declaration under Section 564(b)(1) of the Act, 21 U.S.C. section 360bbb-3(b)(1), unless the authorization is terminated or revoked.  Performed at [facility], [address] [place], [place] [address]   Blood Culture (routine x 2)     Status: None (Preliminary result)   Collection Time: 03/26/23  3:12 PM   Specimen: BLOOD  Result Value Ref Range Status   Specimen Description   Final    BLOOD LEFT ANTECUBITAL Performed at [facility], [address] [place], [place] [address]    Special Requests   Final    BOTTLES DRAWN AEROBIC AND ANAEROBIC Blood Culture adequate volume Performed at [facility], [address], [place], [place] [address]    Culture   Final    NO GROWTH 2 DAYS Performed at [facility], [address], [place], [place] [address]    Report Status PENDING  Incomplete  Blood Culture (routine x 2)     Status: None (Preliminary result)   Collection Time: 03/26/23  6:10 PM   Specimen: BLOOD  Result Value Ref Range Status   Specimen Description   Final    BLOOD BLOOD RIGHT FOREARM Performed at [facility], [address], [place], [place] [address]    Special Requests   Final    BOTTLES DRAWN AEROBIC ONLY Blood Culture adequate volume Performed at [facility], [address] [place], [place] [address]    Culture   Final    NO GROWTH < 24 HOURS Performed at [facility], [address], [place], [place] [address]    Report Status PENDING  Incomplete     Studies: MR FOOT LEFT WO CONTRAST Result Date: 03/27/2023 CLINICAL DATA:  Foot erythema. Swelling associated with the second toe of the left foot. Chronic foot pain. Diabetes. Clinical concern for osteomyelitis. EXAM: MRI OF THE LEFT FOOT  WITHOUT CONTRAST TECHNIQUE: Multiplanar, multisequence MR imaging of the left forefoot was performed. No intravenous contrast was administered. COMPARISON:  left foot radiographs 03/26/2023, 04/19/2021 FINDINGS: Despite efforts by the technologist and patient,  mild-to-moderate motion artifact is present on today's exam and could not be eliminated. This reduces exam sensitivity and specificity. Bones/Joint/Cartilage There is metallic artifact from a pin within the distal first metatarsal and a screw within the second metatarsal head, obscuring the surrounding regions. There is moderate focal marrow edema within the distal aspect of the proximal phalanx of the second toe (sagittal series 11, image 13, coronal series 9, image 17). Mild marrow edema within the distal third of the great toe (sagittal series 11, image 10 and coronal series 9, image 15). Moderately intense small region of edema within the distal aspect of the distal phalanx of the third toe (sagittal series 11, image 17 and coronal series 9, image 17). There is mild marrow edema seen throughout the congenitally fused middle and distal phalanges of the fifth toe. Mild-to-moderate hallux valgus. Moderate great toe metatarsophalangeal cartilage thinning,a prominent dorsal subchondral marrow edema, and peripheral osteophytosis osteoarthritis. 5 mm degenerative cyst at the medial aspect of the great toe metatarsal head. Ligaments The Lisfranc ligament complex is intact. The metatarsophalangeal and interphalangeal cartilage elements appear intact. Muscles and Tendons There is moderate edema throughout the plantar midfoot musculature with mild fluid in between the flexor digitorum longus tendons and the flexor digitorum brevis muscles and tendons (axial series 8, image 40). Soft tissues Motion artifact and inhomogeneous fat saturation limits evaluation of the toe soft tissues. There is mild edema and soft tissue swelling of the second toe. Probable mild edema and  swelling of the distal aspect of the rest of the toes as well. No significant ulceration is identified. IMPRESSION: 1. Motion artifact and metallic susceptibility artifact limited examination. 2. There is soft tissue swelling of the second toe. There is moderate focal marrow edema within the distal aspect of the proximal phalanx of the second toe. This is nonspecific and may be reactive or secondary to early osteomyelitis. Recommend clinical correlation for concern for infection of the second toe. 3. Mild marrow edema within the distal third of the great toe, moderately intense small region of edema within the distal aspect of the distal phalanx of the third toe, and mild marrow edema throughout the congenitally fused middle and distal phalanges of the fifth toe. The technical limitations of the study limits evaluation for soft tissue ulceration recommend clinical correlation for concern for infection within these toes, which would raise concern for the marrow edema representing early osteomyelitis. 4. Mild-to-moderate hallux valgus. Moderate great toe metatarsophalangeal osteoarthritis. Electronically Signed   By: Tanda Lyons M.D.   On: 03/27/2023 14:20   DG Foot Complete Left Result Date: 03/26/2023 CLINICAL DATA:  wound to 2nd toe. EXAM: LEFT FOOT - COMPLETE 3+ VIEW COMPARISON:  04/19/2021. FINDINGS: No acute fracture or dislocation. Please note evaluation of the distal phalanx of second toe is limited due to overlapping with the adjacent toes. However, no discrete focal bone destruction/erosion noted. Mild-to-moderate hallux valgus deformity noted. Metallic K-wire/screws noted overlying the distal portion of the first and second metatarsals, similar to the prior study. Ankle mortise appears intact. No focal soft tissue swelling. No evidence of air within soft tissue. No radiopaque foreign bodies. IMPRESSION: No radiographic evidence of acute osteomyelitis involving the second toe. Correlate clinically to  determine the need for additional imaging with MRI. Electronically Signed   By: Ree Molt M.D.   On: 03/26/2023 16:07   DG Chest Port 1 View Result Date: 03/26/2023 CLINICAL DATA:  Questionable sepsis - evaluate for abnormality. Fever. Headache. EXAM: PORTABLE CHEST 1 VIEW  COMPARISON:  01/10/2023. FINDINGS: Low lung volume. There are probable atelectatic changes in the left lung base. Bilateral lung fields are otherwise clear. No dense consolidation or lung collapse. No pulmonary edema. Bilateral costophrenic angles are clear. Stable cardio-mediastinal silhouette. No acute osseous abnormalities. The soft tissues are within normal limits. Right-sided central venous catheter noted with its tip overlying the right atrium region. There is an additional tubing overlying the left upper abdomen. IMPRESSION: No active disease. Electronically Signed   By: Ree Molt M.D.   On: 03/26/2023 16:03      Vernal Alstrom, MD  [facility] 03/28/2023  If 7PM-7AM, please contact night-coverage

## 2023-03-28 NOTE — Consult Note (Signed)
 WOC team consulted for open wound between L great and 2nd digits.  This patient has subsequently been evaluated by Dr. Harden (see his note) with orders for a dry dressing daily and postop shoe.    WOC team will not follow. Re-consult if further needs arise.   Thank you,    Powell Bar MSN, RN-BC, [employer] [phone]

## 2023-03-28 NOTE — Progress Notes (Signed)
 PT Cancellation Note  Patient Details Name: TOMECA HELM MRN: [id number] DOB: Mar 14, 1935   Cancelled Treatment:    Reason Eval/Treat Not Completed: Patient at procedure or test/unavailable (Pt at HD, acute PT to re-attempt as able.)  Lataja Newland W, PT, DPT Secure Chat Preferred  Rehab Office [phone]  Kate BRAVO Wendolyn 03/28/2023, 8:00 AM

## 2023-03-28 NOTE — Evaluation (Addendum)
 Physical Therapy Evaluation Patient Details Name: Kim Burns MRN: [id number] DOB: 12/09/1935 Today's Date: 03/28/2023  History of Present Illness  88 y.o. female presents to [facility] 03/26/23 with worsening L foot redness. Pt with acute osteomyelitis of PIP joint of 2nd toe and edema of other joints consistent with gout. PMH - afib, ESRD on HD, chf, DM, HTN, Whipple, gout, bradycardia, TIA, CVA   Clinical Impression  Pt in bed upon arrival and agreeable to PT eval. Prior to admit, pt would ambulate with RW or rollator short distances with family close by to supervise. In today's session, pt was able to ambulate ~80 ft with CGA and RW. Pt required ModA to stand from lower recliner surface. Pt presents to therapy session with decreased strength, balance, and mobility. Pt would benefit from acute skilled PT to address functional impairments. Recommending post-acute HHPT to work towards independence with mobility. Pt will have 24/7 physical assist available at home upon d/c. Acute PT to follow.          If plan is discharge home, recommend the following: A little help with walking and/or transfers;A little help with bathing/dressing/bathroom;Assist for transportation;Help with stairs or ramp for entrance   Can travel by private vehicle    Yes    Equipment Recommendations None recommended by PT     Functional Status Assessment Patient has had a recent decline in their functional status and demonstrates the ability to make significant improvements in function in a reasonable and predictable amount of time.     Precautions / Restrictions Precautions Precautions: Fall Precaution Comments: drain in L back Required Braces or Orthoses: Other Brace Other Brace: L post-op shoe Restrictions Weight Bearing Restrictions Per Provider Order: Yes LLE Weight Bearing Per Provider Order: Weight bearing as tolerated      Mobility  Bed Mobility Overal bed mobility: Needs Assistance Bed Mobility: Sit to  Supine     Sit to supine: Min assist   General bed mobility comments: MinA to lift L LE into bed    Transfers Overall transfer level: Needs assistance Equipment used: Rolling walker (2 wheels) Transfers: Sit to/from Stand Sit to Stand: Mod assist      General transfer comment: ModA for boost up    Ambulation/Gait Ambulation/Gait assistance: Contact guard assist Gait Distance (Feet): 80 Feet Assistive device: Rolling walker (2 wheels) Gait Pattern/deviations: Step-through pattern, Shuffle, Trunk flexed Gait velocity: decr     General Gait Details: steady with short, shuffling steps        Balance Overall balance assessment: Needs assistance, Mild deficits observed, not formally tested, History of Falls Sitting-balance support: No upper extremity supported, Feet supported Sitting balance-Leahy Scale: Good     Standing balance support: Bilateral upper extremity supported, During functional activity, Reliant on assistive device for balance Standing balance-Leahy Scale: Poor Standing balance comment: reliant on RW       Pertinent Vitals/Pain Pain Assessment Pain Assessment: No/denies pain    Home Living Family/patient expects to be discharged to:: Private residence Living Arrangements: Children Available Help at Discharge: Family;Available 24 hours/day Type of Home: House Home Access: Ramped entrance      Home Layout: Able to live on main level with bedroom/bathroom;Laundry or work area in [employer] Equipment: Agricultural Consultant (2 wheels);Shower seat;Rollator (4 wheels);Wheelchair - manual;Cane - single point;Tub bench Additional Comments: has 24/7 assist from either son or daughter. Children switch providing assistance    Prior Function Prior Level of Function : Independent/Modified Independent;History of Falls (last six months)  Mobility Comments: uses rollator or RW, family is close by when pt is ambulating ADLs Comments: mod I     Extremity/Trunk  Assessment   Upper Extremity Assessment Upper Extremity Assessment: Generalized weakness    Lower Extremity Assessment Lower Extremity Assessment: Generalized weakness (grossly 4/5 bilaterally)    Cervical / Trunk Assessment Cervical / Trunk Assessment: Kyphotic  Communication   Communication Communication: Hearing impairment Cueing Techniques: Verbal cues  Cognition Arousal: Alert Behavior During Therapy: WFL for tasks assessed/performed Overall Cognitive Status: Within Functional Limits for tasks assessed    General Comments: A&Ox4, pt reports having increased confusion lately. Has difficulty with children switching being caregivers due to different routines        General Comments General comments (skin integrity, edema, etc.): VSS on RA, redness in L shin     PT Assessment Patient needs continued PT services  PT Problem List Decreased strength;Decreased activity tolerance;Decreased balance;Decreased mobility       PT Treatment Interventions DME instruction;Gait training;Functional mobility training;Therapeutic activities;Therapeutic exercise;Stair training;Balance training;Neuromuscular re-education;Patient/family education    PT Goals (Current goals can be found in the Care Plan section)  Acute Rehab PT Goals Patient Stated Goal: to go home PT Goal Formulation: With patient Time For Goal Achievement: 04/11/23 Potential to Achieve Goals: Good    Frequency Min 1X/week        AM-PAC PT 6 Clicks Mobility  Outcome Measure Help needed turning from your back to your side while in a flat bed without using bedrails?: A Little Help needed moving from lying on your back to sitting on the side of a flat bed without using bedrails?: A Little Help needed moving to and from a bed to a chair (including a wheelchair)?: A Lot Help needed standing up from a chair using your arms (e.g., wheelchair or bedside chair)?: A Lot Help needed to walk in hospital room?: A Little Help  needed climbing 3-5 steps with a railing? : A Little 6 Click Score: 16    End of Session Equipment Utilized During Treatment: Gait belt;Other (comment) (post op shoe) Activity Tolerance: Patient tolerated treatment well Patient left: in bed;with call bell/phone within reach;with bed alarm set Nurse Communication: Mobility status PT Visit Diagnosis: Other abnormalities of gait and mobility (R26.89);Muscle weakness (generalized) (M62.81);History of falling (Z91.81)    Time: 1329-1400 PT Time Calculation (min) (ACUTE ONLY): 31 min   Charges:   PT Evaluation $PT Eval Low Complexity: 1 Low PT Treatments $Gait Training: 8-22 mins PT General Charges $$ ACUTE PT VISIT: 1 Visit        Kate ORN, PT, DPT Secure Chat Preferred  Rehab Office [phone]   Kate BRAVO Wendolyn 03/28/2023, 2:18 PM

## 2023-03-29 DIAGNOSIS — L03116 Cellulitis of left lower limb: Secondary | ICD-10-CM | POA: Diagnosis not present

## 2023-03-29 LAB — GLUCOSE, CAPILLARY: Glucose-Capillary: 212 mg/dL — ABNORMAL HIGH (ref 70–99)

## 2023-03-29 LAB — HEPATITIS B SURFACE ANTIBODY, QUANTITATIVE: Hep B S AB Quant (Post): <3.5 m[IU]/mL — ABNORMAL LOW

## 2023-03-29 MED ORDER — METOPROLOL SUCCINATE ER 25 MG PO TB24
25.0000 mg | ORAL_TABLET | Freq: Every day | ORAL | Status: DC
  Administered 2023-03-29: 25 mg via ORAL
  Filled 2023-03-29: qty 1

## 2023-03-29 MED ORDER — METOPROLOL TARTRATE 5 MG/5ML IV SOLN
2.5000 mg | Freq: Once | INTRAVENOUS | Status: AC
  Administered 2023-03-29: 2.5 mg via INTRAVENOUS
  Filled 2023-03-29: qty 5

## 2023-03-29 MED ORDER — DOXYCYCLINE HYCLATE 100 MG PO TABS: 100.0000 mg | ORAL_TABLET | Freq: Two times a day (BID) | ORAL | 0 refills | Status: DC

## 2023-03-29 MED ORDER — METOPROLOL TARTRATE 5 MG/5ML IV SOLN
2.5000 mg | INTRAVENOUS | Status: AC | PRN
Start: 2023-03-29 — End: 2023-03-29
  Administered 2023-03-29 (×2): 2.5 mg via INTRAVENOUS
  Filled 2023-03-29: qty 5

## 2023-03-29 NOTE — Progress Notes (Signed)
 Subjective: Seen in room, no complaints, said tolerated dialysis yesterday 2.5 L UF, thinks she is going home today.   States will be living with son for a week in [place] area .  He has arranged outpatient dialysis there.  Objective Vital signs in last 24 hours: Vitals:   03/29/23 0600 03/29/23 0618 03/29/23 0630 03/29/23 0813  BP: (!) 141/83 109/73 129/68 (!) 133/90  Pulse: (!) 114 (!) 117 (!) 109   Resp: 19 19 18    Temp:      TempSrc:      SpO2: 96% 95% 95% 98%  Weight:      Height:       Weight change: -1.839 kg  Physical Exam: General: Alert, chronically ill, pleasant elderly female NAD Heart: Irregular, irregular heart rate 98 on telemetry, no MRG Lungs: CTA bilaterally room air unlabored breathing Abdomen: NABS, soft NTND Extremities: No pedal edema Dialysis Access: Right IJ TDC, L UA AV F + bruit, minimal ecchymosis upper area of arm  Dialysis Orders: [facility] MWF 3.5h   B350   51.4kg 2/2 bath  AVF / TDC  16g  Heparin  none - last HD 2/5 post wt 51.6kg   - very small wt gains, getting to dry wt - hectorol  3 mcg - mircera 225 mcg q 2, last 1/24, due 2/07   Renal-related home meds: - hydralazine  50 tid prn - toprol  xl 25mg  - midodrine  10mg  tid if sbp < 120 - renvela  800mg  ac tid  Problem/Plan: Cellulitis L leg/ foot - failed OP po abx.  Now resolving with IV abx /  Dr. Crist  following = post-op shoe placed and recommends discharge on oral ABXs. Ortho plans to schedule f/u in 2 weeks. ESRD - on HD MWF. On HD.  Currently on schedule/noted to be discharged to [place] area with outpatient HD arranged already by son, patient has been at that Center before HTN -on midodrine  with BP's /wnl here.  Takes metoprolol  history of PAF also takes hydralazine  PRN at home. Volume - no vol overload on exam.  Now at EDW per weight Anemia of esrd - Hb 8.0   darbe 200 mcg weekly-given 2/7.  Secondary hyperparathyroidism - CCa in range, phos 6.9 Cont binders  PAF - takes metoprolol   xl. Last EF 65%,    Alm Shown, PA-C [facility] [phone] 03/29/2023,9:18 AM  LOS: 3 days   Labs: Basic Metabolic Panel: Recent Labs  Lab 03/26/23 1519 03/27/23 0622 03/28/23 0600  NA 139 138 138  K 3.7 3.5 3.9  CL 96* 100 98  CO2 33* 26 24  GLUCOSE 228* 142* 217*  BUN 19 27* 45*  CREATININE 2.15* 2.71* 4.05*  CALCIUM  8.4* 8.7* 9.0  PHOS  --  6.9*  --    Liver Function Tests: Recent Labs  Lab 03/26/23 1519 03/27/23 0622  AST 36 27  ALT 56* 50*  ALKPHOS 98 91  BILITOT 0.4 0.7  PROT 6.4* 6.0*  ALBUMIN  2.7* 2.1*   No results for input(s): LIPASE, AMYLASE in the last 168 hours. No results for input(s): AMMONIA in the last 168 hours. CBC: Recent Labs  Lab 03/26/23 1519 03/27/23 0622 03/28/23 0600  WBC 11.1* 10.6* 10.2  NEUTROABS 10.0* 9.0*  --   HGB 8.6* 8.5* 8.0*  HCT 29.3* 27.9* 26.7*  MCV 96.7 95.2 96.0  PLT 225 238 256   Cardiac Enzymes: No results for input(s): CKTOTAL, CKMB, CKMBINDEX, TROPONINI in the last 168 hours. CBG: Recent Labs  Lab 03/28/23  0617 03/28/23 1202 03/28/23 1724 03/28/23 2033 03/29/23 0554  GLUCAP 224* 146* 197* 127* 212*    Studies/Results: MR FOOT LEFT WO CONTRAST Result Date: 03/27/2023 CLINICAL DATA:  Foot erythema. Swelling associated with the second toe of the left foot. Chronic foot pain. Diabetes. Clinical concern for osteomyelitis. EXAM: MRI OF THE LEFT FOOT WITHOUT CONTRAST TECHNIQUE: Multiplanar, multisequence MR imaging of the left forefoot was performed. No intravenous contrast was administered. COMPARISON:  left foot radiographs 03/26/2023, 04/19/2021 FINDINGS: Despite efforts by the technologist and patient, mild-to-moderate motion artifact is present on today's exam and could not be eliminated. This reduces exam sensitivity and specificity. Bones/Joint/Cartilage There is metallic artifact from a pin within the distal first metatarsal and a screw within the second metatarsal  head, obscuring the surrounding regions. There is moderate focal marrow edema within the distal aspect of the proximal phalanx of the second toe (sagittal series 11, image 13, coronal series 9, image 17). Mild marrow edema within the distal third of the great toe (sagittal series 11, image 10 and coronal series 9, image 15). Moderately intense small region of edema within the distal aspect of the distal phalanx of the third toe (sagittal series 11, image 17 and coronal series 9, image 17). There is mild marrow edema seen throughout the congenitally fused middle and distal phalanges of the fifth toe. Mild-to-moderate hallux valgus. Moderate great toe metatarsophalangeal cartilage thinning,a prominent dorsal subchondral marrow edema, and peripheral osteophytosis osteoarthritis. 5 mm degenerative cyst at the medial aspect of the great toe metatarsal head. Ligaments The Lisfranc ligament complex is intact. The metatarsophalangeal and interphalangeal cartilage elements appear intact. Muscles and Tendons There is moderate edema throughout the plantar midfoot musculature with mild fluid in between the flexor digitorum longus tendons and the flexor digitorum brevis muscles and tendons (axial series 8, image 40). Soft tissues Motion artifact and inhomogeneous fat saturation limits evaluation of the toe soft tissues. There is mild edema and soft tissue swelling of the second toe. Probable mild edema and swelling of the distal aspect of the rest of the toes as well. No significant ulceration is identified. IMPRESSION: 1. Motion artifact and metallic susceptibility artifact limited examination. 2. There is soft tissue swelling of the second toe. There is moderate focal marrow edema within the distal aspect of the proximal phalanx of the second toe. This is nonspecific and may be reactive or secondary to early osteomyelitis. Recommend clinical correlation for concern for infection of the second toe. 3. Mild marrow edema within  the distal third of the great toe, moderately intense small region of edema within the distal aspect of the distal phalanx of the third toe, and mild marrow edema throughout the congenitally fused middle and distal phalanges of the fifth toe. The technical limitations of the study limits evaluation for soft tissue ulceration recommend clinical correlation for concern for infection within these toes, which would raise concern for the marrow edema representing early osteomyelitis. 4. Mild-to-moderate hallux valgus. Moderate great toe metatarsophalangeal osteoarthritis. Electronically Signed   By: Tanda Lyons M.D.   On: 03/27/2023 14:20   Medications:  cefTRIAXone  (ROCEPHIN )  IV 2 g (03/28/23 1755)   vancomycin  500 mg (03/28/23 1907)    amiodarone   200 mg Oral Daily   aspirin   81 mg Oral Q T,Th,S,Su   aspirin   81 mg Oral Q M,W,F-2000   Chlorhexidine  Gluconate Cloth  6 each Topical Daily   Chlorhexidine  Gluconate Cloth  6 each Topical Q0600   darbepoetin (ARANESP ) injection - DIALYSIS  200 mcg Subcutaneous Q Thu-1800   heparin  injection (subcutaneous)  5,000 Units Subcutaneous Q8H   insulin  aspart  0-6 Units Subcutaneous TID WC   insulin  aspart  2 Units Subcutaneous TID WC   lipase/protease/amylase  36,000 Units Oral TID AC   metoprolol  succinate  25 mg Oral Daily   sevelamer  carbonate  800 mg Oral TID WC   sodium chloride  flush  10-40 mL Intracatheter Q12H   vancomycin  variable dose per unstable renal function (pharmacist dosing)   Does not apply See admin instructions

## 2023-03-29 NOTE — Discharge Summary (Signed)
 Physician Discharge Summary  Kim Burns FMW:[id number] DOB: April 02, 1935 DOA: 03/26/2023  PCP: Waylan Almarie [name], MD  Admit date: 03/26/2023 Discharge date: 03/29/2023  Admitted From: Home  Discharge disposition: Home   Recommendations for Outpatient Follow-Up:   Follow up with your primary care provider in one week.  Check CBC, BMP, magnesium  in the next visit Follow-up with Dr Harden orthopedics in 2 weeks for foot infection. Follow-up with your cardiologist as scheduled by the clinic for your heart problem.   Discharge Diagnosis:   Principal Problem:   Cellulitis of left foot Active Problems:   ESRD on dialysis (HCC)   Lactic acidosis   Paroxysmal atrial fibrillation (HCC)   IPMN (intraductal papillary mucinous neoplasm)   DM2 (diabetes mellitus, type 2) (HCC)   Chronic diastolic CHF (congestive heart failure) (HCC)   History of anemia due to chronic kidney disease   Sterile pyuria   Discharge Condition: Improved.  Diet recommendation: Low sodium, heart healthy.   Wound care: None.  Code status: Full.   History of Present Illness:   Kim Burns is a 88 y.o. female with medical history significant for intraductal papillary mucinous neoplasm of the pancreas status post Whipple procedure in 2018, type 2 diabetes mellitus, paroxysmal atrial fibrillation, chronic diastolic heart failure, end-stage renal disease on hemodialysis on Monday, Wednesday, Friday schedule, presented to the hospital with 2 weeks of redness on the left second toe, foot with some swelling.  She initially presented to [facility] ED where she was noted to be febrile with a temperature of 100 F.  Labs showed hyperglycemia.  Initial lactate was elevated at 2.5 repeat at 1.8.  She did have mild leukocytosis and hemoglobin was 8.6.  Urinalysis showed 11-20 white cells.  Blood cultures were drawn in the ED. COVID, influenza, RSV PCR were all negative.  EKG showed normal sinus rhythm.  X-ray of the  left foot showed no evidence of osteomyelitis or foreign body.  Chest x-ray was negative for acute infiltrate.  In the ED patient received IV vancomycin  and Zosyn  normal saline 500 mL bolus and was admitted hospital for left foot cellulitis failed outpatient treatment.    Hospital Course:   Following conditions were addressed during hospitalization as listed below,  Cellulitis and acute osteomyelitis of the left second  toe Patient with history of failure to outpatient antibiotic with Keflex .  Febrile and leukocytosis in the ED. patient was initially started on vancomycin  and Zosyn .     CRP elevated at 6.2 and ESR of 85.[name]  Hemoglobin A1c of 5.4.  Blood cultures negative in 2 days..  Temperature max of 98.9..  MRI of the foot with some bone marrow edema suggestive of acute osteomyelitis as per orthopedics. Dr Harden, orthopedic has recommended oral antibiotic for 2 weeks on discharge with outpatient follow-up with Dr Harden.  Will prescribe doxycycline  on discharge until seen by Dr Harden.   Lactic Acidosis: Initial lactate of 2.5, with interval normalization to 1.8.  Received IV fluids initially.   Asymptomatic Pyuria:  WBC in urine but without urinary symptoms.  Received antibiotic during hospitalization     Type 2 Diabetes Mellitus with hyperglycemia:  Latest hemoglobin A1c noted to be 6.1% in September 2024.  On insulin  regimen at home which will be continued on discharge.   Paroxysmal atrial fibrillation:   CHA2DS2-VASc score of  5, continue amiodarone  and metoprolol  succinate, which she takes on Tuesday, Thursday, Saturday and Sunday.  Continue aspirin , telemetry monitoring.  Recent 2D echocardiogram from  December 2024, and notable for LVEF 6065%, no evidence of focal wall motion with grade 3 diastolic dysfunction.   Recommend follow-up with cardiology as outpatient.  Had episodes of RVR in the hospital.   Chronic diastolic heart failure:  Review of latest 2D echocardiogram from December 2024,  which is notable for LVEF 60 to 65% as well as grade 3 diastolic dysfunction, currently compensated.   Volume management as per dialysis.    ESRD: on HD on hemodialysis Monday Wednesday Friday.  Appears to be compensated.  Nephrology was consulted for hemodialysis.    Has percutaneous nephrostomy tube in place.   History of intraductal papillary mucinous neoplasm: Documented history of such, status post Whipple procedure in 2018. On Creon  as an outpatient.  Will continue for now.    Anemia of chronic kidney disease: baseline hgb range 7-11,.  Continue to monitor.  Latest hemoglobin of 8.0 and has remained stable.  Disposition.  At this time, patient is stable for disposition home with outpatient PCP, orthopedics and cardiology follow-up.  Medical Consultants:   Nephrology Orthopedics Procedures:    Hemodialysis  Subjective:   Today, patient was seen and examined at bedside.  Patient states that she feels okay today and denies any nausea, vomiting, chest pain or dyspnea.   Lying comfortably in bed.  Discharge Exam:   Vitals:   03/29/23 0630 03/29/23 0813  BP: 129/68 (!) 133/90  Pulse: (!) 109   Resp: 18   Temp:    SpO2: 95% 98%   Vitals:   03/29/23 0600 03/29/23 0618 03/29/23 0630 03/29/23 0813  BP: (!) 141/83 109/73 129/68 (!) 133/90  Pulse: (!) 114 (!) 117 (!) 109   Resp: 19 19 18    Temp:      TempSrc:      SpO2: 96% 95% 95% 98%  Weight:      Height:       Body mass index is 19.92 kg/m.   General: Alert awake, not in obvious distress, thinly built, impaired memory, HENT: pupils equally reacting to light,  No scleral pallor or icterus noted. Oral mucosa is moist.  Chest:  Clear breath sounds.  Diminished breath sounds bilaterally. No crackles or wheezes.  CVS: S1 &S2 heard. No murmur.  Irregularly irregular rhythm. Abdomen: Soft, nontender, nondistended.  Bowel sounds are heard.   Extremities: Left lower extremity foot with erythema and wound over the second toe  covered with dressing.  Left upper extremity fistula in place. Psych: Alert, awake and oriented, normal mood CNS:  No cranial nerve deficits.  Moves all extremities. Skin: Warm and dry.  Left foot with dressing.  The results of significant diagnostics from this hospitalization (including imaging, microbiology, ancillary and laboratory) are listed below for reference.     Diagnostic Studies:   MR FOOT LEFT WO CONTRAST Result Date: 03/27/2023 CLINICAL DATA:  Foot erythema. Swelling associated with the second toe of the left foot. Chronic foot pain. Diabetes. Clinical concern for osteomyelitis. EXAM: MRI OF THE LEFT FOOT WITHOUT CONTRAST TECHNIQUE: Multiplanar, multisequence MR imaging of the left forefoot was performed. No intravenous contrast was administered. COMPARISON:  left foot radiographs 03/26/2023, 04/19/2021 FINDINGS: Despite efforts by the technologist and patient, mild-to-moderate motion artifact is present on today's exam and could not be eliminated. This reduces exam sensitivity and specificity. Bones/Joint/Cartilage There is metallic artifact from a pin within the distal first metatarsal and a screw within the second metatarsal head, obscuring the surrounding regions. There is moderate focal marrow edema within the distal  aspect of the proximal phalanx of the second toe (sagittal series 11, image 13, coronal series 9, image 17). Mild marrow edema within the distal third of the great toe (sagittal series 11, image 10 and coronal series 9, image 15). Moderately intense small region of edema within the distal aspect of the distal phalanx of the third toe (sagittal series 11, image 17 and coronal series 9, image 17). There is mild marrow edema seen throughout the congenitally fused middle and distal phalanges of the fifth toe. Mild-to-moderate hallux valgus. Moderate great toe metatarsophalangeal cartilage thinning,a prominent dorsal subchondral marrow edema, and peripheral osteophytosis  osteoarthritis. 5 mm degenerative cyst at the medial aspect of the great toe metatarsal head. Ligaments The Lisfranc ligament complex is intact. The metatarsophalangeal and interphalangeal cartilage elements appear intact. Muscles and Tendons There is moderate edema throughout the plantar midfoot musculature with mild fluid in between the flexor digitorum longus tendons and the flexor digitorum brevis muscles and tendons (axial series 8, image 40). Soft tissues Motion artifact and inhomogeneous fat saturation limits evaluation of the toe soft tissues. There is mild edema and soft tissue swelling of the second toe. Probable mild edema and swelling of the distal aspect of the rest of the toes as well. No significant ulceration is identified. IMPRESSION: 1. Motion artifact and metallic susceptibility artifact limited examination. 2. There is soft tissue swelling of the second toe. There is moderate focal marrow edema within the distal aspect of the proximal phalanx of the second toe. This is nonspecific and may be reactive or secondary to early osteomyelitis. Recommend clinical correlation for concern for infection of the second toe. 3. Mild marrow edema within the distal third of the great toe, moderately intense small region of edema within the distal aspect of the distal phalanx of the third toe, and mild marrow edema throughout the congenitally fused middle and distal phalanges of the fifth toe. The technical limitations of the study limits evaluation for soft tissue ulceration recommend clinical correlation for concern for infection within these toes, which would raise concern for the marrow edema representing early osteomyelitis. 4. Mild-to-moderate hallux valgus. Moderate great toe metatarsophalangeal osteoarthritis. Electronically Signed   By: Tanda Lyons M.D.   On: 03/27/2023 14:20   DG Foot Complete Left Result Date: 03/26/2023 CLINICAL DATA:  wound to 2nd toe. EXAM: LEFT FOOT - COMPLETE 3+ VIEW  COMPARISON:  04/19/2021. FINDINGS: No acute fracture or dislocation. Please note evaluation of the distal phalanx of second toe is limited due to overlapping with the adjacent toes. However, no discrete focal bone destruction/erosion noted. Mild-to-moderate hallux valgus deformity noted. Metallic K-wire/screws noted overlying the distal portion of the first and second metatarsals, similar to the prior study. Ankle mortise appears intact. No focal soft tissue swelling. No evidence of air within soft tissue. No radiopaque foreign bodies. IMPRESSION: No radiographic evidence of acute osteomyelitis involving the second toe. Correlate clinically to determine the need for additional imaging with MRI. Electronically Signed   By: Ree Molt M.D.   On: 03/26/2023 16:07   DG Chest Port 1 View Result Date: 03/26/2023 CLINICAL DATA:  Questionable sepsis - evaluate for abnormality. Fever. Headache. EXAM: PORTABLE CHEST 1 VIEW COMPARISON:  01/10/2023. FINDINGS: Low lung volume. There are probable atelectatic changes in the left lung base. Bilateral lung fields are otherwise clear. No dense consolidation or lung collapse. No pulmonary edema. Bilateral costophrenic angles are clear. Stable cardio-mediastinal silhouette. No acute osseous abnormalities. The soft tissues are within normal limits. Right-sided central  venous catheter noted with its tip overlying the right atrium region. There is an additional tubing overlying the left upper abdomen. IMPRESSION: No active disease. Electronically Signed   By: Ree Molt M.D.   On: 03/26/2023 16:03     Labs:   Basic Metabolic Panel: Recent Labs  Lab 03/26/23 1519 03/27/23 0622 03/28/23 0600  NA 139 138 138  K 3.7 3.5 3.9  CL 96* 100 98  CO2 33* 26 24  GLUCOSE 228* 142* 217*  BUN 19 27* 45*  CREATININE 2.15* 2.71* 4.05*  CALCIUM  8.4* 8.7* 9.0  MG  --  2.3 2.6*  PHOS  --  6.9*  --    GFR Estimated Creatinine Clearance: 8.2 mL/min (A) (by C-G formula based on  SCr of 4.05 mg/dL (H)). Liver Function Tests: Recent Labs  Lab 03/26/23 1519 03/27/23 0622  AST 36 27  ALT 56* 50*  ALKPHOS 98 91  BILITOT 0.4 0.7  PROT 6.4* 6.0*  ALBUMIN  2.7* 2.1*   No results for input(s): LIPASE, AMYLASE in the last 168 hours. No results for input(s): AMMONIA in the last 168 hours. Coagulation profile Recent Labs  Lab 03/26/23 1519  INR 1.1    CBC: Recent Labs  Lab 03/26/23 1519 03/27/23 0622 03/28/23 0600  WBC 11.1* 10.6* 10.2  NEUTROABS 10.0* 9.0*  --   HGB 8.6* 8.5* 8.0*  HCT 29.3* 27.9* 26.7*  MCV 96.7 95.2 96.0  PLT 225 238 256   Cardiac Enzymes: No results for input(s): CKTOTAL, CKMB, CKMBINDEX, TROPONINI in the last 168 hours. BNP: Invalid input(s): POCBNP CBG: Recent Labs  Lab 03/28/23 0617 03/28/23 1202 03/28/23 1724 03/28/23 2033 03/29/23 0554  GLUCAP 224* 146* 197* 127* 212*   D-Dimer No results for input(s): DDIMER in the last 72 hours. Hgb A1c Recent Labs    03/27/23 0622  HGBA1C 5.4   Lipid Profile No results for input(s): CHOL, HDL, LDLCALC, TRIG, CHOLHDL, LDLDIRECT in the last 72 hours. Thyroid  function studies No results for input(s): TSH, T4TOTAL, T3FREE, THYROIDAB in the last 72 hours.  Invalid input(s): FREET3 Anemia work up No results for input(s): VITAMINB12, FOLATE, FERRITIN, TIBC, IRON, RETICCTPCT in the last 72 hours. Microbiology Recent Results (from the past 240 hours)  Resp panel by RT-PCR (RSV, Flu A&B, Covid) Anterior Nasal Swab     Status: None   Collection Time: 03/26/23  3:09 PM   Specimen: Anterior Nasal Swab  Result Value Ref Range Status   SARS Coronavirus 2 by RT PCR NEGATIVE NEGATIVE Final    Comment: (NOTE) SARS-CoV-2 target nucleic acids are NOT DETECTED.  The SARS-CoV-2 RNA is generally detectable in upper respiratory specimens during the acute phase of infection. The lowest concentration of SARS-CoV-2 viral copies this assay  can detect is 138 copies/mL. A negative result does not preclude SARS-Cov-2 infection and should not be used as the sole basis for treatment or other patient management decisions. A negative result may occur with  improper specimen collection/handling, submission of specimen other than nasopharyngeal swab, presence of viral mutation(s) within the areas targeted by this assay, and inadequate number of viral copies(<138 copies/mL). A negative result must be combined with clinical observations, patient history, and epidemiological information. The expected result is Negative.  Fact Sheet for Patients:  bloggercourse.com  Fact Sheet for Healthcare Providers:  seriousbroker.it  This test is no t yet approved or cleared by the [place]  FDA and  has been authorized for detection and/or diagnosis of SARS-CoV-2 by FDA under an Emergency  Use Authorization (EUA). This EUA will remain  in effect (meaning this test can be used) for the duration of the COVID-19 declaration under Section 564(b)(1) of the Act, 21 U.S.C.section 360bbb-3(b)(1), unless the authorization is terminated  or revoked sooner.       Influenza A by PCR NEGATIVE NEGATIVE Final   Influenza B by PCR NEGATIVE NEGATIVE Final    Comment: (NOTE) The Xpert Xpress SARS-CoV-2/FLU/RSV plus assay is intended as an aid in the diagnosis of influenza from Nasopharyngeal swab specimens and should not be used as a sole basis for treatment. Nasal washings and aspirates are unacceptable for Xpert Xpress SARS-CoV-2/FLU/RSV testing.  Fact Sheet for Patients: bloggercourse.com  Fact Sheet for Healthcare Providers: seriousbroker.it  This test is not yet approved or cleared by the [place]  FDA and has been authorized for detection and/or diagnosis of SARS-CoV-2 by FDA under an Emergency Use Authorization (EUA). This EUA will  remain in effect (meaning this test can be used) for the duration of the COVID-19 declaration under Section 564(b)(1) of the Act, 21 U.S.C. section 360bbb-3(b)(1), unless the authorization is terminated or revoked.     Resp Syncytial Virus by PCR NEGATIVE NEGATIVE Final    Comment: (NOTE) Fact Sheet for Patients: bloggercourse.com  Fact Sheet for Healthcare Providers: seriousbroker.it  This test is not yet approved or cleared by the [place]  FDA and has been authorized for detection and/or diagnosis of SARS-CoV-2 by FDA under an Emergency Use Authorization (EUA). This EUA will remain in effect (meaning this test can be used) for the duration of the COVID-19 declaration under Section 564(b)(1) of the Act, 21 U.S.C. section 360bbb-3(b)(1), unless the authorization is terminated or revoked.  Performed at [facility], [address] [place], [place] [address]   Blood Culture (routine x 2)     Status: None (Preliminary result)   Collection Time: 03/26/23  3:12 PM   Specimen: BLOOD  Result Value Ref Range Status   Specimen Description   Final    BLOOD LEFT ANTECUBITAL Performed at [facility], [address] [place], [place] [address]    Special Requests   Final    BOTTLES DRAWN AEROBIC AND ANAEROBIC Blood Culture adequate volume Performed at [facility], [address] [place], [place] [address]    Culture   Final    NO GROWTH 3 DAYS Performed at [facility], [address] [place], [place] [address]    Report Status PENDING  Incomplete  Blood Culture (routine x 2)     Status: None (Preliminary result)   Collection Time: 03/26/23  6:10 PM   Specimen: BLOOD  Result Value Ref Range Status   Specimen Description   Final    BLOOD BLOOD RIGHT FOREARM Performed at [facility], [address] [place], [place] [address]    Special  Requests   Final    BOTTLES DRAWN AEROBIC ONLY Blood Culture adequate volume Performed at [facility], [address] [place], [place] [address]    Culture   Final    NO GROWTH 2 DAYS Performed at [facility], [address], [place], [place] [address]    Report Status PENDING  Incomplete     Discharge Instructions:   Discharge Instructions     Call MD for:  difficulty breathing, headache or visual disturbances   Complete by: As directed    Call MD for:  persistant nausea and vomiting   Complete by: As directed  Call MD for:  severe uncontrolled pain   Complete by: As directed    Call MD for:  temperature >100.4   Complete by: As directed    Diet general   Complete by: As directed    Discharge instructions   Complete by: As directed    Follow up with your primary care provider in one week. Check blood work at that time.  Follow-up with Dr Harden in  2 weeks to reassess your foot infection..  Continue the course of antibiotic.  Seek medical attention for worsening symptoms.  Follow-up with your cardiologist as a scheduled by you.   Increase activity slowly   Complete by: As directed    No wound care   Complete by: As directed       Allergies as of 03/29/2023       Reactions   Lopid [gemfibrozil] Other (See Comments)   Myalgia    Lotemax [loteprednol Etabonate] Rash   Skin burning  Blurry vision   Statins Other (See Comments)   Rosuvastatin Muscle weakness   Norco [hydrocodone -acetaminophen ] Other (See Comments)   Headaches  Tolerates acetaminophen     Other Diarrhea   Real Butter - diarrhea   Vibra -tab [doxycycline ] Other (See Comments)   Unknown reaction   Amlodipine Other (See Comments)   Norvasc   Codeine Other (See Comments)   headache   Hydrocodone  Other (See Comments)   Hydrocodone -acetaminophen  Other (See Comments)   Headache. Tolerates acetaminophen .   Lisinopril Other (See Comments)   Verapamil  Other (See Comments)         Medication List     STOP taking these medications    Korsuva 65 MCG/1.3ML Soln Generic drug: Difelikefalin Acetate       TAKE these medications    acetaminophen  325 MG tablet Commonly known as: TYLENOL  Take 2 tablets (650 mg total) by mouth every 6 (six) hours as needed for mild pain (pain score 1-3), moderate pain (pain score 4-6), fever or headache.   amantadine  100 MG capsule Commonly known as: SYMMETREL  Take 1 capsule (100 mg total) by mouth once a week.   amiodarone  200 MG tablet Commonly known as: PACERONE  Take 1 tablet (200 mg total) by mouth daily.   Aspirin  Low Dose 81 MG chewable tablet Generic drug: aspirin  Chew 1 tablet (81 mg total) by mouth daily. What changed: additional instructions   Azelastine  HCl 137 MCG/SPRAY Soln Place 1-2 sprays into both nostrils daily as needed (allergies).   Creon  36000 UNITS Cpep capsule Generic drug: lipase/protease/amylase Take 36,000 Units by mouth See admin instructions. Take 36,000 units (1 capsule) by mouth with meals and snacks.   cycloSPORINE  0.05 % ophthalmic emulsion Commonly known as: RESTASIS  Place 1 drop into both eyes daily as needed (irritation). What changed: reasons to take this   DIALYVITE 800 WITH ZINC  0.8 MG Tabs Take 1 tablet by mouth daily.   diphenhydramine -acetaminophen  25-500 MG Tabs tablet Commonly known as: TYLENOL  PM Take 2 tablets by mouth at bedtime.   doxycycline  100 MG tablet Commonly known as: VIBRA -TABS Take 1 tablet (100 mg total) by mouth 2 (two) times daily for 14 days.   hydrALAZINE  50 MG tablet Commonly known as: APRESOLINE  Take 50 mg by mouth every 8 (eight) hours as needed (for BP greater than 160).   lidocaine -prilocaine  cream Commonly known as: EMLA  Apply 1 Application topically every Monday, Wednesday, and Friday with hemodialysis.   meclizine  25 MG tablet Commonly known as: ANTIVERT  Take 1 tablet (25 mg total) by mouth  3 (three) times daily as needed for  dizziness.   metoprolol  succinate 25 MG 24 hr tablet Commonly known as: TOPROL -XL Take 1 tablet (25 mg total) by mouth 3 (three) times a week. Take on nondialysis days, Tuesday, Thursday, Saturday and Sunday if systolic BP greater than 130 mmHg   midodrine  10 MG tablet Commonly known as: PROAMATINE  Take 1 tablet (10 mg total) by mouth See admin instructions. Take 10mg  by mouth before dialysis. Bring with you to dialysis in case another dose is required. May take 10 mg 3 times a day if systolic BP less than 120 mmHg   NovoLOG  FlexPen 100 UNIT/ML FlexPen Generic drug: insulin  aspart Inject 5-7 Units into the skin 3 (three) times daily with meals. Sliding scale.   Pataday  0.1 % ophthalmic solution Generic drug: olopatadine  Place 1 drop into both eyes 2 (two) times daily as needed for allergies.   rOPINIRole  0.25 MG tablet Commonly known as: REQUIP  Take 1 tablet (0.25 mg total) by mouth 2 (two) times daily. What changed:  how much to take when to take this additional instructions   sevelamer  carbonate 800 MG tablet Commonly known as: RENVELA  Take 1 tablet (800 mg total) by mouth 3 (three) times daily with meals. What changed:  when to take this additional instructions        Follow-up Information     Harden Jerona GAILS, MD Follow up in 2 week(s).   Specialty: Orthopedic Surgery Contact information: [address] [place]  [place] [address] [phone]         Waylan Almarie [name], MD Follow up in 1 week(s).   Specialty: Family Medicine Contact information: [address] [place] [address] [phone]                  Time coordinating discharge: 39 minutes  Signed:  Shima Compere  [facility] 03/29/2023, 9:36 AM

## 2023-03-29 NOTE — Plan of Care (Signed)
  Problem: Education: Goal: Knowledge of General Education information will improve Description: Including pain rating scale, medication(s)/side effects and non-pharmacologic comfort measures Outcome: Progressing   Problem: Health Behavior/Discharge Planning: Goal: Ability to manage health-related needs will improve Outcome: Progressing   Problem: Clinical Measurements: Goal: Ability to maintain clinical measurements within normal limits will improve Outcome: Progressing Goal: Will remain free from infection Outcome: Progressing Goal: Diagnostic test results will improve Outcome: Progressing Goal: Respiratory complications will improve Outcome: Progressing   Problem: Activity: Goal: Risk for activity intolerance will decrease Outcome: Progressing   Problem: Nutrition: Goal: Adequate nutrition will be maintained Outcome: Progressing   Problem: Coping: Goal: Level of anxiety will decrease Outcome: Progressing   Problem: Elimination: Goal: Will not experience complications related to bowel motility Outcome: Progressing Goal: Will not experience complications related to urinary retention Outcome: Progressing   Problem: Pain Managment: Goal: General experience of comfort will improve and/or be controlled Outcome: Progressing   Problem: Safety: Goal: Ability to remain free from injury will improve Outcome: Progressing   Problem: Skin Integrity: Goal: Risk for impaired skin integrity will decrease Outcome: Progressing   Problem: Education: Goal: Ability to describe self-care measures that may prevent or decrease complications (Diabetes Survival Skills Education) will improve Outcome: Progressing Goal: Individualized Educational Video(s) Outcome: Progressing   Problem: Coping: Goal: Ability to adjust to condition or change in health will improve Outcome: Progressing   Problem: Fluid Volume: Goal: Ability to maintain a balanced intake and output will  improve Outcome: Progressing   Problem: Health Behavior/Discharge Planning: Goal: Ability to identify and utilize available resources and services will improve Outcome: Progressing Goal: Ability to manage health-related needs will improve Outcome: Progressing   Problem: Metabolic: Goal: Ability to maintain appropriate glucose levels will improve Outcome: Progressing   Problem: Nutritional: Goal: Maintenance of adequate nutrition will improve Outcome: Progressing Goal: Progress toward achieving an optimal weight will improve Outcome: Progressing   Problem: Skin Integrity: Goal: Risk for impaired skin integrity will decrease Outcome: Progressing   Problem: Tissue Perfusion: Goal: Adequacy of tissue perfusion will improve Outcome: Progressing

## 2023-03-30 NOTE — Progress Notes (Signed)
 [facility] Patient Discharge Orders- [facility] CLINIC: nw  Patient's name: Kim Burns Admit/DC Dates: 03/26/2023 - 03/29/2023  Discharge Diagnoses: Cellulitis L leg/ foot fu Dr Harden 2 wks  on po abx     HTN -on midodrine  with BP's /wnl here.  ESRD - on HD   Aranesp : Given: 03/28/23 on  200 mcg     Last Hgb: 8.0  PRBC's Given: no Date/# of units: 0 ESA dose for discharge: mircera 200 mcg IV q 2 weeks  IV Iron dose at discharge: 0  Heparin  change: no  EDW Change: no New EDW:   [place] Change: no  Access intervention/Change: no Details:  Hectorol /Calcitriol  change: no  Discharge Labs: Calcium9.0 Phosphorus 6.9 Albumin  2.1 K+ 3.5  IV Antibiotics: no Details:  On Coumadin?: no Last INR: Next INR: Managed By:   OTHER/APPTS/LAB ORDERS:    D/C Meds to be reconciled by nurse after every discharge.  Completed By:   Reviewed by: MD:______ RN_______

## 2023-03-31 DIAGNOSIS — I132 Hypertensive heart and chronic kidney disease with heart failure and with stage 5 chronic kidney disease, or end stage renal disease: Secondary | ICD-10-CM | POA: Diagnosis not present

## 2023-03-31 DIAGNOSIS — I499 Cardiac arrhythmia, unspecified: Secondary | ICD-10-CM | POA: Diagnosis not present

## 2023-03-31 DIAGNOSIS — R5383 Other fatigue: Secondary | ICD-10-CM | POA: Diagnosis not present

## 2023-03-31 DIAGNOSIS — I4891 Unspecified atrial fibrillation: Secondary | ICD-10-CM | POA: Diagnosis not present

## 2023-03-31 DIAGNOSIS — Z0000 Encounter for general adult medical examination without abnormal findings: Secondary | ICD-10-CM | POA: Diagnosis not present

## 2023-03-31 DIAGNOSIS — E11628 Type 2 diabetes mellitus with other skin complications: Secondary | ICD-10-CM | POA: Diagnosis not present

## 2023-03-31 DIAGNOSIS — Z1331 Encounter for screening for depression: Secondary | ICD-10-CM | POA: Diagnosis not present

## 2023-03-31 DIAGNOSIS — I120 Hypertensive chronic kidney disease with stage 5 chronic kidney disease or end stage renal disease: Secondary | ICD-10-CM | POA: Diagnosis not present

## 2023-03-31 DIAGNOSIS — R778 Other specified abnormalities of plasma proteins: Secondary | ICD-10-CM | POA: Diagnosis not present

## 2023-03-31 DIAGNOSIS — D72828 Other elevated white blood cell count: Secondary | ICD-10-CM | POA: Diagnosis not present

## 2023-03-31 DIAGNOSIS — R0602 Shortness of breath: Secondary | ICD-10-CM | POA: Diagnosis not present

## 2023-03-31 DIAGNOSIS — J984 Other disorders of lung: Secondary | ICD-10-CM | POA: Diagnosis not present

## 2023-03-31 DIAGNOSIS — R002 Palpitations: Secondary | ICD-10-CM | POA: Diagnosis not present

## 2023-03-31 DIAGNOSIS — D649 Anemia, unspecified: Secondary | ICD-10-CM | POA: Diagnosis not present

## 2023-03-31 DIAGNOSIS — J101 Influenza due to other identified influenza virus with other respiratory manifestations: Secondary | ICD-10-CM | POA: Diagnosis not present

## 2023-03-31 DIAGNOSIS — Z09 Encounter for follow-up examination after completed treatment for conditions other than malignant neoplasm: Secondary | ICD-10-CM | POA: Diagnosis not present

## 2023-03-31 DIAGNOSIS — R0902 Hypoxemia: Secondary | ICD-10-CM | POA: Diagnosis not present

## 2023-03-31 DIAGNOSIS — Z992 Dependence on renal dialysis: Secondary | ICD-10-CM | POA: Diagnosis not present

## 2023-03-31 DIAGNOSIS — R918 Other nonspecific abnormal finding of lung field: Secondary | ICD-10-CM | POA: Diagnosis not present

## 2023-03-31 DIAGNOSIS — E1165 Type 2 diabetes mellitus with hyperglycemia: Secondary | ICD-10-CM | POA: Diagnosis not present

## 2023-03-31 DIAGNOSIS — E1169 Type 2 diabetes mellitus with other specified complication: Secondary | ICD-10-CM | POA: Diagnosis not present

## 2023-03-31 DIAGNOSIS — Z743 Need for continuous supervision: Secondary | ICD-10-CM | POA: Diagnosis not present

## 2023-03-31 DIAGNOSIS — J90 Pleural effusion, not elsewhere classified: Secondary | ICD-10-CM | POA: Diagnosis not present

## 2023-03-31 DIAGNOSIS — L089 Local infection of the skin and subcutaneous tissue, unspecified: Secondary | ICD-10-CM | POA: Diagnosis not present

## 2023-03-31 DIAGNOSIS — Z79899 Other long term (current) drug therapy: Secondary | ICD-10-CM | POA: Diagnosis not present

## 2023-03-31 DIAGNOSIS — R059 Cough, unspecified: Secondary | ICD-10-CM | POA: Diagnosis not present

## 2023-03-31 DIAGNOSIS — Z1339 Encounter for screening examination for other mental health and behavioral disorders: Secondary | ICD-10-CM | POA: Diagnosis not present

## 2023-03-31 DIAGNOSIS — Z794 Long term (current) use of insulin: Secondary | ICD-10-CM | POA: Diagnosis not present

## 2023-03-31 DIAGNOSIS — R519 Headache, unspecified: Secondary | ICD-10-CM | POA: Diagnosis not present

## 2023-03-31 DIAGNOSIS — N186 End stage renal disease: Secondary | ICD-10-CM | POA: Diagnosis not present

## 2023-03-31 DIAGNOSIS — I5033 Acute on chronic diastolic (congestive) heart failure: Secondary | ICD-10-CM | POA: Diagnosis not present

## 2023-03-31 DIAGNOSIS — L03039 Cellulitis of unspecified toe: Secondary | ICD-10-CM | POA: Diagnosis not present

## 2023-03-31 DIAGNOSIS — R9431 Abnormal electrocardiogram [ECG] [EKG]: Secondary | ICD-10-CM | POA: Diagnosis not present

## 2023-03-31 DIAGNOSIS — J111 Influenza due to unidentified influenza virus with other respiratory manifestations: Secondary | ICD-10-CM | POA: Diagnosis not present

## 2023-03-31 NOTE — Progress Notes (Signed)
 Late Note Entry- Mar 31, 2023  Pt was d/c on Saturday. Contacted [facility] this morning to be advised of pt's d/c date and that pt should resume care today.   Lauraine Polite Renal Navigator [phone]

## 2023-04-01 LAB — CULTURE, BLOOD (ROUTINE X 2)
Culture: NO GROWTH
Culture: NO GROWTH
Special Requests: ADEQUATE
Special Requests: ADEQUATE

## 2023-04-02 DIAGNOSIS — N186 End stage renal disease: Secondary | ICD-10-CM | POA: Diagnosis not present

## 2023-04-02 DIAGNOSIS — Z992 Dependence on renal dialysis: Secondary | ICD-10-CM | POA: Diagnosis not present

## 2023-04-03 ENCOUNTER — Telehealth: Payer: Self-pay

## 2023-04-03 DIAGNOSIS — Z0000 Encounter for general adult medical examination without abnormal findings: Secondary | ICD-10-CM | POA: Diagnosis not present

## 2023-04-03 DIAGNOSIS — Z1331 Encounter for screening for depression: Secondary | ICD-10-CM | POA: Diagnosis not present

## 2023-04-03 DIAGNOSIS — Z1339 Encounter for screening examination for other mental health and behavioral disorders: Secondary | ICD-10-CM | POA: Diagnosis not present

## 2023-04-03 DIAGNOSIS — I4891 Unspecified atrial fibrillation: Secondary | ICD-10-CM | POA: Diagnosis not present

## 2023-04-03 DIAGNOSIS — J111 Influenza due to unidentified influenza virus with other respiratory manifestations: Secondary | ICD-10-CM | POA: Diagnosis not present

## 2023-04-03 DIAGNOSIS — L03039 Cellulitis of unspecified toe: Secondary | ICD-10-CM | POA: Diagnosis not present

## 2023-04-03 DIAGNOSIS — N186 End stage renal disease: Secondary | ICD-10-CM | POA: Diagnosis not present

## 2023-04-03 DIAGNOSIS — Z09 Encounter for follow-up examination after completed treatment for conditions other than malignant neoplasm: Secondary | ICD-10-CM | POA: Diagnosis not present

## 2023-04-03 DIAGNOSIS — Z992 Dependence on renal dialysis: Secondary | ICD-10-CM | POA: Diagnosis not present

## 2023-04-03 NOTE — Telephone Encounter (Signed)
Attempted to call for surgery scheduling. LVM

## 2023-04-07 ENCOUNTER — Telehealth: Payer: Self-pay | Admitting: Cardiology

## 2023-04-07 ENCOUNTER — Ambulatory Visit (HOSPITAL_COMMUNITY)
Admission: RE | Admit: 2023-04-07 | Discharge: 2023-04-07 | Disposition: A | Discharge status: Home / Self Care | Payer: Medicare PPO | Source: Ambulatory Visit | Attending: Interventional Radiology | Admitting: Interventional Radiology

## 2023-04-07 ENCOUNTER — Telehealth: Payer: Self-pay

## 2023-04-07 ENCOUNTER — Other Ambulatory Visit (HOSPITAL_COMMUNITY): Payer: Self-pay | Admitting: Interventional Radiology

## 2023-04-07 DIAGNOSIS — I4819 Other persistent atrial fibrillation: Secondary | ICD-10-CM | POA: Diagnosis present

## 2023-04-07 DIAGNOSIS — R0902 Hypoxemia: Secondary | ICD-10-CM | POA: Diagnosis not present

## 2023-04-07 DIAGNOSIS — M19072 Primary osteoarthritis, left ankle and foot: Secondary | ICD-10-CM | POA: Diagnosis not present

## 2023-04-07 DIAGNOSIS — I158 Other secondary hypertension: Secondary | ICD-10-CM | POA: Diagnosis not present

## 2023-04-07 DIAGNOSIS — Z794 Long term (current) use of insulin: Secondary | ICD-10-CM | POA: Diagnosis not present

## 2023-04-07 DIAGNOSIS — I4891 Unspecified atrial fibrillation: Secondary | ICD-10-CM | POA: Diagnosis not present

## 2023-04-07 DIAGNOSIS — M8610 Other acute osteomyelitis, unspecified site: Secondary | ICD-10-CM | POA: Diagnosis not present

## 2023-04-07 DIAGNOSIS — R531 Weakness: Secondary | ICD-10-CM | POA: Diagnosis not present

## 2023-04-07 DIAGNOSIS — D631 Anemia in chronic kidney disease: Secondary | ICD-10-CM | POA: Diagnosis present

## 2023-04-07 DIAGNOSIS — R6521 Severe sepsis with septic shock: Secondary | ICD-10-CM | POA: Diagnosis not present

## 2023-04-07 DIAGNOSIS — Z436 Encounter for attention to other artificial openings of urinary tract: Secondary | ICD-10-CM | POA: Insufficient documentation

## 2023-04-07 DIAGNOSIS — N186 End stage renal disease: Secondary | ICD-10-CM | POA: Diagnosis present

## 2023-04-07 DIAGNOSIS — J9601 Acute respiratory failure with hypoxia: Secondary | ICD-10-CM | POA: Diagnosis not present

## 2023-04-07 DIAGNOSIS — M129 Arthropathy, unspecified: Secondary | ICD-10-CM | POA: Diagnosis not present

## 2023-04-07 DIAGNOSIS — J9621 Acute and chronic respiratory failure with hypoxia: Secondary | ICD-10-CM | POA: Diagnosis present

## 2023-04-07 DIAGNOSIS — I213 ST elevation (STEMI) myocardial infarction of unspecified site: Secondary | ICD-10-CM | POA: Diagnosis not present

## 2023-04-07 DIAGNOSIS — I120 Hypertensive chronic kidney disease with stage 5 chronic kidney disease or end stage renal disease: Secondary | ICD-10-CM | POA: Diagnosis not present

## 2023-04-07 DIAGNOSIS — C649 Malignant neoplasm of unspecified kidney, except renal pelvis: Secondary | ICD-10-CM

## 2023-04-07 DIAGNOSIS — I5032 Chronic diastolic (congestive) heart failure: Secondary | ICD-10-CM | POA: Diagnosis not present

## 2023-04-07 DIAGNOSIS — E440 Moderate protein-calorie malnutrition: Secondary | ICD-10-CM | POA: Diagnosis not present

## 2023-04-07 DIAGNOSIS — M79675 Pain in left toe(s): Secondary | ICD-10-CM | POA: Diagnosis not present

## 2023-04-07 DIAGNOSIS — I2489 Other forms of acute ischemic heart disease: Secondary | ICD-10-CM | POA: Diagnosis present

## 2023-04-07 DIAGNOSIS — J9602 Acute respiratory failure with hypercapnia: Secondary | ICD-10-CM | POA: Diagnosis not present

## 2023-04-07 DIAGNOSIS — R0602 Shortness of breath: Secondary | ICD-10-CM | POA: Diagnosis present

## 2023-04-07 DIAGNOSIS — N179 Acute kidney failure, unspecified: Secondary | ICD-10-CM | POA: Diagnosis not present

## 2023-04-07 DIAGNOSIS — R600 Localized edema: Secondary | ICD-10-CM | POA: Diagnosis not present

## 2023-04-07 DIAGNOSIS — I5021 Acute systolic (congestive) heart failure: Secondary | ICD-10-CM | POA: Diagnosis not present

## 2023-04-07 DIAGNOSIS — H04123 Dry eye syndrome of bilateral lacrimal glands: Secondary | ICD-10-CM | POA: Diagnosis not present

## 2023-04-07 DIAGNOSIS — L299 Pruritus, unspecified: Secondary | ICD-10-CM | POA: Diagnosis not present

## 2023-04-07 DIAGNOSIS — E11649 Type 2 diabetes mellitus with hypoglycemia without coma: Secondary | ICD-10-CM | POA: Diagnosis not present

## 2023-04-07 DIAGNOSIS — E1151 Type 2 diabetes mellitus with diabetic peripheral angiopathy without gangrene: Secondary | ICD-10-CM | POA: Diagnosis present

## 2023-04-07 DIAGNOSIS — R0989 Other specified symptoms and signs involving the circulatory and respiratory systems: Secondary | ICD-10-CM | POA: Diagnosis not present

## 2023-04-07 DIAGNOSIS — J159 Unspecified bacterial pneumonia: Secondary | ICD-10-CM | POA: Diagnosis present

## 2023-04-07 DIAGNOSIS — J9622 Acute and chronic respiratory failure with hypercapnia: Secondary | ICD-10-CM | POA: Diagnosis present

## 2023-04-07 DIAGNOSIS — Z09 Encounter for follow-up examination after completed treatment for conditions other than malignant neoplasm: Secondary | ICD-10-CM | POA: Diagnosis not present

## 2023-04-07 DIAGNOSIS — Z992 Dependence on renal dialysis: Secondary | ICD-10-CM | POA: Diagnosis not present

## 2023-04-07 DIAGNOSIS — E1322 Other specified diabetes mellitus with diabetic chronic kidney disease: Secondary | ICD-10-CM | POA: Diagnosis not present

## 2023-04-07 DIAGNOSIS — I2510 Atherosclerotic heart disease of native coronary artery without angina pectoris: Secondary | ICD-10-CM | POA: Diagnosis not present

## 2023-04-07 DIAGNOSIS — E891 Postprocedural hypoinsulinemia: Secondary | ICD-10-CM | POA: Diagnosis not present

## 2023-04-07 DIAGNOSIS — E872 Acidosis, unspecified: Secondary | ICD-10-CM | POA: Diagnosis present

## 2023-04-07 DIAGNOSIS — I5A Non-ischemic myocardial injury (non-traumatic): Secondary | ICD-10-CM | POA: Diagnosis present

## 2023-04-07 DIAGNOSIS — K219 Gastro-esophageal reflux disease without esophagitis: Secondary | ICD-10-CM | POA: Diagnosis not present

## 2023-04-07 DIAGNOSIS — I132 Hypertensive heart and chronic kidney disease with heart failure and with stage 5 chronic kidney disease, or end stage renal disease: Secondary | ICD-10-CM | POA: Diagnosis present

## 2023-04-07 DIAGNOSIS — N2581 Secondary hyperparathyroidism of renal origin: Secondary | ICD-10-CM | POA: Diagnosis present

## 2023-04-07 DIAGNOSIS — I480 Paroxysmal atrial fibrillation: Secondary | ICD-10-CM | POA: Diagnosis not present

## 2023-04-07 DIAGNOSIS — I700 Atherosclerosis of aorta: Secondary | ICD-10-CM | POA: Diagnosis not present

## 2023-04-07 DIAGNOSIS — L03116 Cellulitis of left lower limb: Secondary | ICD-10-CM | POA: Diagnosis present

## 2023-04-07 DIAGNOSIS — M86172 Other acute osteomyelitis, left ankle and foot: Secondary | ICD-10-CM | POA: Diagnosis present

## 2023-04-07 DIAGNOSIS — I2721 Secondary pulmonary arterial hypertension: Secondary | ICD-10-CM | POA: Diagnosis present

## 2023-04-07 DIAGNOSIS — R7989 Other specified abnormal findings of blood chemistry: Secondary | ICD-10-CM | POA: Diagnosis not present

## 2023-04-07 DIAGNOSIS — E11621 Type 2 diabetes mellitus with foot ulcer: Secondary | ICD-10-CM | POA: Diagnosis present

## 2023-04-07 DIAGNOSIS — Z862 Personal history of diseases of the blood and blood-forming organs and certain disorders involving the immune mechanism: Secondary | ICD-10-CM | POA: Diagnosis not present

## 2023-04-07 DIAGNOSIS — R8281 Pyuria: Secondary | ICD-10-CM | POA: Diagnosis not present

## 2023-04-07 DIAGNOSIS — L03039 Cellulitis of unspecified toe: Secondary | ICD-10-CM | POA: Diagnosis not present

## 2023-04-07 DIAGNOSIS — J1001 Influenza due to other identified influenza virus with the same other identified influenza virus pneumonia: Secondary | ICD-10-CM | POA: Diagnosis present

## 2023-04-07 DIAGNOSIS — M869 Osteomyelitis, unspecified: Secondary | ICD-10-CM | POA: Diagnosis not present

## 2023-04-07 DIAGNOSIS — A419 Sepsis, unspecified organism: Secondary | ICD-10-CM | POA: Diagnosis present

## 2023-04-07 DIAGNOSIS — I5033 Acute on chronic diastolic (congestive) heart failure: Secondary | ICD-10-CM | POA: Diagnosis present

## 2023-04-07 DIAGNOSIS — J111 Influenza due to unidentified influenza virus with other respiratory manifestations: Secondary | ICD-10-CM | POA: Diagnosis not present

## 2023-04-07 DIAGNOSIS — R918 Other nonspecific abnormal finding of lung field: Secondary | ICD-10-CM | POA: Diagnosis not present

## 2023-04-07 DIAGNOSIS — E1122 Type 2 diabetes mellitus with diabetic chronic kidney disease: Secondary | ICD-10-CM | POA: Diagnosis present

## 2023-04-07 DIAGNOSIS — N189 Chronic kidney disease, unspecified: Secondary | ICD-10-CM | POA: Diagnosis not present

## 2023-04-07 DIAGNOSIS — I4821 Permanent atrial fibrillation: Secondary | ICD-10-CM | POA: Diagnosis not present

## 2023-04-07 DIAGNOSIS — G9341 Metabolic encephalopathy: Secondary | ICD-10-CM | POA: Diagnosis present

## 2023-04-07 DIAGNOSIS — Z7401 Bed confinement status: Secondary | ICD-10-CM | POA: Diagnosis not present

## 2023-04-07 DIAGNOSIS — R6520 Severe sepsis without septic shock: Secondary | ICD-10-CM | POA: Diagnosis not present

## 2023-04-07 HISTORY — PX: IR NEPHROSTOMY EXCHANGE LEFT: IMG6069

## 2023-04-07 MED ORDER — LIDOCAINE HCL 1 % IJ SOLN
20.0000 mL | Freq: Once | INTRAMUSCULAR | Status: AC
  Administered 2023-04-07: 5 mL

## 2023-04-07 MED ORDER — LIDOCAINE HCL 1 % IJ SOLN
INTRAMUSCULAR | Status: AC
  Filled 2023-04-07: qty 20

## 2023-04-07 MED ORDER — IOHEXOL 300 MG/ML  SOLN
50.0000 mL | Freq: Once | INTRAMUSCULAR | Status: AC | PRN
  Administered 2023-04-07: 10 mL

## 2023-04-07 NOTE — Telephone Encounter (Signed)
Patient identification verified by 2 forms. Shade Flood, RN     Tried calling, but no answer. LVMTCB.  AVS from hospital shows patient was to return to home regime upon discharge. Patient should be taken metoprolol as originally prescribed.

## 2023-04-07 NOTE — Telephone Encounter (Signed)
 Attempted to call for surgery scheduling. LVM

## 2023-04-07 NOTE — Procedures (Signed)
Interventional Radiology Procedure:   Indications: Routine nephrostomy tube exchange  Procedure: Nephrostomy tube exchange  Findings: New 10 Fr tube in left renal pelvis  Complications: None     EBL: Minimal  Plan: Continue with routine exchanges  Shayle Donahoo R. Lowella Dandy, MD  Pager: [phone]

## 2023-04-07 NOTE — Telephone Encounter (Signed)
Pt c/o medication issue:  1. Name of Medication:   metoprolol succinate (TOPROL-XL) 25 MG 24 hr tablet    2. How are you currently taking this medication (dosage and times per day)   Take 1 tablet (25 mg total) by mouth 3 (three) times a week. Take on nondialysis days, Tuesday, Thursday, Saturday and Sunday if systolic BP greater than 130 mmHg    3. Are you having a reaction (difficulty breathing--STAT)? No  4. What is your medication issue? Pt's son states that pt was recently in the hospital. Upon discharge they changed above medication dosage and instructions to the following:5 MG once daily. He would like a c/b regarding this matter. Please advise

## 2023-04-08 DIAGNOSIS — H04123 Dry eye syndrome of bilateral lacrimal glands: Secondary | ICD-10-CM | POA: Diagnosis not present

## 2023-04-08 DIAGNOSIS — Z09 Encounter for follow-up examination after completed treatment for conditions other than malignant neoplasm: Secondary | ICD-10-CM | POA: Diagnosis not present

## 2023-04-08 DIAGNOSIS — L03039 Cellulitis of unspecified toe: Secondary | ICD-10-CM | POA: Diagnosis not present

## 2023-04-08 DIAGNOSIS — J111 Influenza due to unidentified influenza virus with other respiratory manifestations: Secondary | ICD-10-CM | POA: Diagnosis not present

## 2023-04-08 DIAGNOSIS — I4891 Unspecified atrial fibrillation: Secondary | ICD-10-CM | POA: Diagnosis not present

## 2023-04-08 NOTE — Telephone Encounter (Signed)
Patient identification verified by 2 forms. Marilynn Rail, RN    Called and spoke to patients son Epifania Gore states:   -patient recently discharged from [facility]   -at discharge Metoprolol was changed to 50mg  daily  -previously patient was not taking this daily   -Metoprolol previously given on non-dialysis days due to low BP   -patient has dialysis MWF   -would like clarification on how to give metoprolol Rx  Informed [name] message sent to Dr. [place] for input  [name] verbalized understanding, no questions at this time

## 2023-04-08 NOTE — Telephone Encounter (Signed)
 Attempted to call patient, no answer left message requesting a call back.

## 2023-04-09 DIAGNOSIS — N2581 Secondary hyperparathyroidism of renal origin: Secondary | ICD-10-CM | POA: Diagnosis not present

## 2023-04-09 DIAGNOSIS — N186 End stage renal disease: Secondary | ICD-10-CM | POA: Diagnosis not present

## 2023-04-09 DIAGNOSIS — L299 Pruritus, unspecified: Secondary | ICD-10-CM | POA: Diagnosis not present

## 2023-04-09 DIAGNOSIS — Z992 Dependence on renal dialysis: Secondary | ICD-10-CM | POA: Diagnosis not present

## 2023-04-09 MED ORDER — METOPROLOL SUCCINATE ER 50 MG PO TB24: 50.0000 mg | ORAL_TABLET | ORAL | 2 refills | Status: DC

## 2023-04-09 NOTE — Addendum Note (Signed)
Addended by: Marilynn Rail on: 04/09/2023 12:33 PM   Modules accepted: Orders

## 2023-04-09 NOTE — Telephone Encounter (Signed)
Kim Burns, Kim Burns, Kim Burns  You; Kim Burns, Kim Burns, LPNYesterday (12:14 PM)    With history of low BP on post dialysis should continue metoprolol only on nondialysis days  Kim [place] Kim Burns, [facility]   Patient identification verified by 2 forms. Kim Rail, Kim Burns    Called and spoke to patients son Kim Burns Dr. [place] message  Updated Rx sent to pharmacy  Kim Burns verbalized understanding, no questions at this time

## 2023-04-10 ENCOUNTER — Ambulatory Visit: Payer: Medicare PPO | Admitting: Dermatology

## 2023-04-10 ENCOUNTER — Inpatient Hospital Stay (HOSPITAL_COMMUNITY)
Admission: EM | Admit: 2023-04-10 | Discharge: 2023-04-19 | DRG: 871 | Disposition: A | Discharge status: Skilled Nursing Facility | Payer: Medicare PPO | Attending: Internal Medicine | Admitting: Internal Medicine

## 2023-04-10 ENCOUNTER — Emergency Department (HOSPITAL_COMMUNITY): Payer: Medicare PPO

## 2023-04-10 ENCOUNTER — Other Ambulatory Visit: Payer: Self-pay

## 2023-04-10 ENCOUNTER — Encounter (HOSPITAL_COMMUNITY): Payer: Self-pay | Admitting: Emergency Medicine

## 2023-04-10 DIAGNOSIS — M869 Osteomyelitis, unspecified: Secondary | ICD-10-CM | POA: Diagnosis not present

## 2023-04-10 DIAGNOSIS — Z808 Family history of malignant neoplasm of other organs or systems: Secondary | ICD-10-CM

## 2023-04-10 DIAGNOSIS — G9341 Metabolic encephalopathy: Secondary | ICD-10-CM | POA: Diagnosis present

## 2023-04-10 DIAGNOSIS — I2510 Atherosclerotic heart disease of native coronary artery without angina pectoris: Secondary | ICD-10-CM | POA: Diagnosis not present

## 2023-04-10 DIAGNOSIS — Z90710 Acquired absence of both cervix and uterus: Secondary | ICD-10-CM

## 2023-04-10 DIAGNOSIS — J1001 Influenza due to other identified influenza virus with the same other identified influenza virus pneumonia: Secondary | ICD-10-CM | POA: Diagnosis present

## 2023-04-10 DIAGNOSIS — R627 Adult failure to thrive: Secondary | ICD-10-CM | POA: Diagnosis present

## 2023-04-10 DIAGNOSIS — I700 Atherosclerosis of aorta: Secondary | ICD-10-CM | POA: Diagnosis not present

## 2023-04-10 DIAGNOSIS — Z85528 Personal history of other malignant neoplasm of kidney: Secondary | ICD-10-CM

## 2023-04-10 DIAGNOSIS — A419 Sepsis, unspecified organism: Secondary | ICD-10-CM | POA: Diagnosis present

## 2023-04-10 DIAGNOSIS — D631 Anemia in chronic kidney disease: Secondary | ICD-10-CM | POA: Diagnosis present

## 2023-04-10 DIAGNOSIS — I480 Paroxysmal atrial fibrillation: Secondary | ICD-10-CM | POA: Diagnosis not present

## 2023-04-10 DIAGNOSIS — Z7982 Long term (current) use of aspirin: Secondary | ICD-10-CM

## 2023-04-10 DIAGNOSIS — J9622 Acute and chronic respiratory failure with hypercapnia: Secondary | ICD-10-CM | POA: Diagnosis present

## 2023-04-10 DIAGNOSIS — I2721 Secondary pulmonary arterial hypertension: Secondary | ICD-10-CM | POA: Diagnosis present

## 2023-04-10 DIAGNOSIS — Z885 Allergy status to narcotic agent status: Secondary | ICD-10-CM

## 2023-04-10 DIAGNOSIS — Z888 Allergy status to other drugs, medicaments and biological substances status: Secondary | ICD-10-CM

## 2023-04-10 DIAGNOSIS — E11649 Type 2 diabetes mellitus with hypoglycemia without coma: Secondary | ICD-10-CM | POA: Diagnosis not present

## 2023-04-10 DIAGNOSIS — N2581 Secondary hyperparathyroidism of renal origin: Secondary | ICD-10-CM | POA: Diagnosis present

## 2023-04-10 DIAGNOSIS — E1151 Type 2 diabetes mellitus with diabetic peripheral angiopathy without gangrene: Secondary | ICD-10-CM | POA: Diagnosis present

## 2023-04-10 DIAGNOSIS — Z800 Family history of malignant neoplasm of digestive organs: Secondary | ICD-10-CM

## 2023-04-10 DIAGNOSIS — I2489 Other forms of acute ischemic heart disease: Secondary | ICD-10-CM | POA: Diagnosis present

## 2023-04-10 DIAGNOSIS — R531 Weakness: Secondary | ICD-10-CM | POA: Diagnosis not present

## 2023-04-10 DIAGNOSIS — Z862 Personal history of diseases of the blood and blood-forming organs and certain disorders involving the immune mechanism: Secondary | ICD-10-CM

## 2023-04-10 DIAGNOSIS — R1310 Dysphagia, unspecified: Secondary | ICD-10-CM | POA: Diagnosis not present

## 2023-04-10 DIAGNOSIS — E1122 Type 2 diabetes mellitus with diabetic chronic kidney disease: Secondary | ICD-10-CM | POA: Diagnosis present

## 2023-04-10 DIAGNOSIS — E11621 Type 2 diabetes mellitus with foot ulcer: Secondary | ICD-10-CM | POA: Diagnosis present

## 2023-04-10 DIAGNOSIS — E559 Vitamin D deficiency, unspecified: Secondary | ICD-10-CM | POA: Diagnosis present

## 2023-04-10 DIAGNOSIS — R0989 Other specified symptoms and signs involving the circulatory and respiratory systems: Secondary | ICD-10-CM | POA: Diagnosis not present

## 2023-04-10 DIAGNOSIS — Z79899 Other long term (current) drug therapy: Secondary | ICD-10-CM

## 2023-04-10 DIAGNOSIS — I132 Hypertensive heart and chronic kidney disease with heart failure and with stage 5 chronic kidney disease, or end stage renal disease: Secondary | ICD-10-CM | POA: Diagnosis present

## 2023-04-10 DIAGNOSIS — K219 Gastro-esophageal reflux disease without esophagitis: Secondary | ICD-10-CM | POA: Diagnosis not present

## 2023-04-10 DIAGNOSIS — J159 Unspecified bacterial pneumonia: Secondary | ICD-10-CM | POA: Diagnosis present

## 2023-04-10 DIAGNOSIS — Z8507 Personal history of malignant neoplasm of pancreas: Secondary | ICD-10-CM

## 2023-04-10 DIAGNOSIS — Z820 Family history of epilepsy and other diseases of the nervous system: Secondary | ICD-10-CM

## 2023-04-10 DIAGNOSIS — I5A Non-ischemic myocardial injury (non-traumatic): Secondary | ICD-10-CM | POA: Diagnosis present

## 2023-04-10 DIAGNOSIS — Z794 Long term (current) use of insulin: Secondary | ICD-10-CM | POA: Diagnosis not present

## 2023-04-10 DIAGNOSIS — R6520 Severe sepsis without septic shock: Secondary | ICD-10-CM | POA: Diagnosis present

## 2023-04-10 DIAGNOSIS — R7989 Other specified abnormal findings of blood chemistry: Secondary | ICD-10-CM | POA: Diagnosis present

## 2023-04-10 DIAGNOSIS — Z8673 Personal history of transient ischemic attack (TIA), and cerebral infarction without residual deficits: Secondary | ICD-10-CM

## 2023-04-10 DIAGNOSIS — Z881 Allergy status to other antibiotic agents status: Secondary | ICD-10-CM

## 2023-04-10 DIAGNOSIS — N186 End stage renal disease: Secondary | ICD-10-CM | POA: Diagnosis present

## 2023-04-10 DIAGNOSIS — E872 Acidosis, unspecified: Secondary | ICD-10-CM | POA: Diagnosis present

## 2023-04-10 DIAGNOSIS — Z87442 Personal history of urinary calculi: Secondary | ICD-10-CM

## 2023-04-10 DIAGNOSIS — I4891 Unspecified atrial fibrillation: Secondary | ICD-10-CM | POA: Diagnosis not present

## 2023-04-10 DIAGNOSIS — M8610 Other acute osteomyelitis, unspecified site: Secondary | ICD-10-CM | POA: Diagnosis present

## 2023-04-10 DIAGNOSIS — Z992 Dependence on renal dialysis: Secondary | ICD-10-CM | POA: Diagnosis not present

## 2023-04-10 DIAGNOSIS — R600 Localized edema: Secondary | ICD-10-CM | POA: Diagnosis not present

## 2023-04-10 DIAGNOSIS — J9602 Acute respiratory failure with hypercapnia: Secondary | ICD-10-CM | POA: Diagnosis not present

## 2023-04-10 DIAGNOSIS — I5032 Chronic diastolic (congestive) heart failure: Secondary | ICD-10-CM | POA: Diagnosis present

## 2023-04-10 DIAGNOSIS — N189 Chronic kidney disease, unspecified: Secondary | ICD-10-CM | POA: Diagnosis not present

## 2023-04-10 DIAGNOSIS — I4819 Other persistent atrial fibrillation: Secondary | ICD-10-CM | POA: Diagnosis present

## 2023-04-10 DIAGNOSIS — R0902 Hypoxemia: Secondary | ICD-10-CM | POA: Diagnosis not present

## 2023-04-10 DIAGNOSIS — I951 Orthostatic hypotension: Secondary | ICD-10-CM | POA: Diagnosis not present

## 2023-04-10 DIAGNOSIS — L03116 Cellulitis of left lower limb: Secondary | ICD-10-CM | POA: Diagnosis present

## 2023-04-10 DIAGNOSIS — J9621 Acute and chronic respiratory failure with hypoxia: Secondary | ICD-10-CM

## 2023-04-10 DIAGNOSIS — Z90411 Acquired partial absence of pancreas: Secondary | ICD-10-CM

## 2023-04-10 DIAGNOSIS — Z8249 Family history of ischemic heart disease and other diseases of the circulatory system: Secondary | ICD-10-CM

## 2023-04-10 DIAGNOSIS — Z91018 Allergy to other foods: Secondary | ICD-10-CM

## 2023-04-10 DIAGNOSIS — I4821 Permanent atrial fibrillation: Secondary | ICD-10-CM | POA: Diagnosis not present

## 2023-04-10 DIAGNOSIS — E119 Type 2 diabetes mellitus without complications: Secondary | ICD-10-CM

## 2023-04-10 DIAGNOSIS — I213 ST elevation (STEMI) myocardial infarction of unspecified site: Secondary | ICD-10-CM | POA: Diagnosis not present

## 2023-04-10 DIAGNOSIS — R5381 Other malaise: Secondary | ICD-10-CM

## 2023-04-10 DIAGNOSIS — R918 Other nonspecific abnormal finding of lung field: Secondary | ICD-10-CM | POA: Diagnosis not present

## 2023-04-10 DIAGNOSIS — R0602 Shortness of breath: Secondary | ICD-10-CM | POA: Diagnosis present

## 2023-04-10 DIAGNOSIS — R6521 Severe sepsis with septic shock: Secondary | ICD-10-CM | POA: Diagnosis not present

## 2023-04-10 DIAGNOSIS — M86172 Other acute osteomyelitis, left ankle and foot: Secondary | ICD-10-CM | POA: Diagnosis present

## 2023-04-10 DIAGNOSIS — I5033 Acute on chronic diastolic (congestive) heart failure: Secondary | ICD-10-CM | POA: Diagnosis present

## 2023-04-10 DIAGNOSIS — I120 Hypertensive chronic kidney disease with stage 5 chronic kidney disease or end stage renal disease: Secondary | ICD-10-CM | POA: Diagnosis not present

## 2023-04-10 DIAGNOSIS — J9601 Acute respiratory failure with hypoxia: Secondary | ICD-10-CM | POA: Diagnosis not present

## 2023-04-10 DIAGNOSIS — L97529 Non-pressure chronic ulcer of other part of left foot with unspecified severity: Secondary | ICD-10-CM | POA: Diagnosis present

## 2023-04-10 DIAGNOSIS — E8809 Other disorders of plasma-protein metabolism, not elsewhere classified: Secondary | ICD-10-CM | POA: Diagnosis present

## 2023-04-10 DIAGNOSIS — R54 Age-related physical debility: Secondary | ICD-10-CM | POA: Diagnosis present

## 2023-04-10 DIAGNOSIS — E875 Hyperkalemia: Secondary | ICD-10-CM | POA: Diagnosis present

## 2023-04-10 DIAGNOSIS — I158 Other secondary hypertension: Secondary | ICD-10-CM | POA: Diagnosis not present

## 2023-04-10 DIAGNOSIS — Z9049 Acquired absence of other specified parts of digestive tract: Secondary | ICD-10-CM

## 2023-04-10 DIAGNOSIS — M129 Arthropathy, unspecified: Secondary | ICD-10-CM | POA: Diagnosis not present

## 2023-04-10 DIAGNOSIS — M19072 Primary osteoarthritis, left ankle and foot: Secondary | ICD-10-CM | POA: Diagnosis not present

## 2023-04-10 DIAGNOSIS — Z823 Family history of stroke: Secondary | ICD-10-CM

## 2023-04-10 DIAGNOSIS — M79675 Pain in left toe(s): Secondary | ICD-10-CM | POA: Diagnosis not present

## 2023-04-10 DIAGNOSIS — E1169 Type 2 diabetes mellitus with other specified complication: Secondary | ICD-10-CM | POA: Diagnosis present

## 2023-04-10 LAB — I-STAT VENOUS BLOOD GAS, ED
Acid-Base Excess: 3 mmol/L — ABNORMAL HIGH (ref 0.0–2.0)
Bicarbonate: 26.3 mmol/L (ref 20.0–28.0)
Calcium, Ion: 0.84 mmol/L — CL (ref 1.15–1.40)
HCT: 42 % (ref 36.0–46.0)
Hemoglobin: 14.3 g/dL (ref 12.0–15.0)
O2 Saturation: 100 %
Potassium: 4 mmol/L (ref 3.5–5.1)
Sodium: 136 mmol/L (ref 135–145)
TCO2: 27 mmol/L (ref 22–32)
pCO2, Ven: 35 mm[Hg] — ABNORMAL LOW (ref 44–60)
pH, Ven: 7.484 — ABNORMAL HIGH (ref 7.25–7.43)
pO2, Ven: 201 mm[Hg] — ABNORMAL HIGH (ref 32–45)

## 2023-04-10 LAB — CBC WITH DIFFERENTIAL/PLATELET
Abs Immature Granulocytes: 0.09 10*3/uL — ABNORMAL HIGH (ref 0.00–0.07)
Basophils Absolute: 0.1 10*3/uL (ref 0.0–0.1)
Basophils Relative: 1 %
Eosinophils Absolute: 0.1 10*3/uL (ref 0.0–0.5)
Eosinophils Relative: 1 %
HCT: 37.6 % (ref 36.0–46.0)
Hemoglobin: 11 g/dL — ABNORMAL LOW (ref 12.0–15.0)
Immature Granulocytes: 1 %
Lymphocytes Relative: 7 %
Lymphs Abs: 0.9 10*3/uL (ref 0.7–4.0)
MCH: 28.4 pg (ref 26.0–34.0)
MCHC: 29.3 g/dL — ABNORMAL LOW (ref 30.0–36.0)
MCV: 97.2 fL (ref 80.0–100.0)
Monocytes Absolute: 0.4 10*3/uL (ref 0.1–1.0)
Monocytes Relative: 3 %
Neutro Abs: 11.8 10*3/uL — ABNORMAL HIGH (ref 1.7–7.7)
Neutrophils Relative %: 87 %
Platelets: 198 10*3/uL (ref 150–400)
RBC: 3.87 MIL/uL (ref 3.87–5.11)
RDW: 21.6 % — ABNORMAL HIGH (ref 11.5–15.5)
WBC: 13.3 10*3/uL — ABNORMAL HIGH (ref 4.0–10.5)
nRBC: 1.1 % — ABNORMAL HIGH (ref 0.0–0.2)

## 2023-04-10 LAB — I-STAT CG4 LACTIC ACID, ED: Lactic Acid, Venous: 3.8 mmol/L (ref 0.5–1.9)

## 2023-04-10 LAB — COMPREHENSIVE METABOLIC PANEL
ALT: 31 U/L (ref 0–44)
AST: 36 U/L (ref 15–41)
Albumin: 2.5 g/dL — ABNORMAL LOW (ref 3.5–5.0)
Alkaline Phosphatase: 65 U/L (ref 38–126)
Anion gap: 20 — ABNORMAL HIGH (ref 5–15)
BUN: 34 mg/dL — ABNORMAL HIGH (ref 8–23)
CO2: 21 mmol/L — ABNORMAL LOW (ref 22–32)
Calcium: 8.1 mg/dL — ABNORMAL LOW (ref 8.9–10.3)
Chloride: 98 mmol/L (ref 98–111)
Creatinine, Ser: 3.83 mg/dL — ABNORMAL HIGH (ref 0.44–1.00)
GFR, Estimated: 11 mL/min — ABNORMAL LOW (ref >60)
Glucose, Bld: 208 mg/dL — ABNORMAL HIGH (ref 70–99)
Potassium: 3.7 mmol/L (ref 3.5–5.1)
Sodium: 139 mmol/L (ref 135–145)
Total Bilirubin: 1.3 mg/dL — ABNORMAL HIGH (ref 0.0–1.2)
Total Protein: 5.9 g/dL — ABNORMAL LOW (ref 6.5–8.1)

## 2023-04-10 LAB — TROPONIN I (HIGH SENSITIVITY): Troponin I (High Sensitivity): 240 ng/L (ref <18)

## 2023-04-10 LAB — BRAIN NATRIURETIC PEPTIDE: B Natriuretic Peptide: 2195.1 pg/mL — ABNORMAL HIGH (ref 0.0–100.0)

## 2023-04-10 MED ORDER — VANCOMYCIN HCL IN DEXTROSE 1-5 GM/200ML-% IV SOLN
1000.0000 mg | Freq: Once | INTRAVENOUS | Status: AC
  Administered 2023-04-10: 1000 mg via INTRAVENOUS
  Filled 2023-04-10: qty 200

## 2023-04-10 MED ORDER — VANCOMYCIN HCL 500 MG/100ML IV SOLN
500.0000 mg | INTRAVENOUS | Status: DC
Start: 2023-04-12 — End: 2023-04-13
  Administered 2023-04-12: 500 mg via INTRAVENOUS
  Filled 2023-04-10: qty 100

## 2023-04-10 MED ORDER — SODIUM CHLORIDE 0.9 % IV SOLN
1.0000 g | INTRAVENOUS | Status: DC
  Administered 2023-04-10 – 2023-04-12 (×3): 1 g via INTRAVENOUS
  Filled 2023-04-10 (×5): qty 10

## 2023-04-10 MED ORDER — ONDANSETRON HCL 4 MG/2ML IJ SOLN
4.0000 mg | Freq: Once | INTRAMUSCULAR | Status: AC
  Administered 2023-04-10: 4 mg via INTRAVENOUS
  Filled 2023-04-10: qty 2

## 2023-04-10 MED ORDER — MELATONIN 3 MG PO TABS
3.0000 mg | ORAL_TABLET | Freq: Every evening | ORAL | Status: DC | PRN
  Administered 2023-04-13: 3 mg via ORAL
  Filled 2023-04-10 (×3): qty 1

## 2023-04-10 MED ORDER — HEPARIN (PORCINE) 25000 UT/250ML-% IV SOLN
1050.0000 [IU]/h | INTRAVENOUS | Status: DC
  Administered 2023-04-10: 750 [IU]/h via INTRAVENOUS
  Administered 2023-04-11: 1050 [IU]/h via INTRAVENOUS
  Filled 2023-04-10 (×2): qty 250

## 2023-04-10 MED ORDER — ONDANSETRON HCL 4 MG/2ML IJ SOLN
4.0000 mg | Freq: Four times a day (QID) | INTRAMUSCULAR | Status: DC | PRN
  Administered 2023-04-16: 4 mg via INTRAVENOUS
  Filled 2023-04-10 (×2): qty 2

## 2023-04-10 MED ORDER — HYDROMORPHONE HCL 1 MG/ML IJ SOLN
0.2500 mg | Freq: Once | INTRAMUSCULAR | Status: AC
  Administered 2023-04-10: 0.25 mg via INTRAVENOUS
  Filled 2023-04-10: qty 1

## 2023-04-10 MED ORDER — SODIUM CHLORIDE 0.9 % IV BOLUS
500.0000 mL | Freq: Once | INTRAVENOUS | Status: AC
  Administered 2023-04-10: 500 mL via INTRAVENOUS

## 2023-04-10 MED ORDER — ACETAMINOPHEN 325 MG PO TABS
650.0000 mg | ORAL_TABLET | Freq: Four times a day (QID) | ORAL | Status: DC | PRN
  Administered 2023-04-11 – 2023-04-16 (×2): 650 mg via ORAL
  Filled 2023-04-10 (×2): qty 2

## 2023-04-10 MED ORDER — ACETAMINOPHEN 650 MG RE SUPP: 650.0000 mg | Freq: Four times a day (QID) | RECTAL | Status: DC | PRN

## 2023-04-10 MED ORDER — INSULIN ASPART 100 UNIT/ML IJ SOLN: 0.0000 [IU] | Freq: Three times a day (TID) | INTRAMUSCULAR | Status: DC

## 2023-04-10 NOTE — ED Triage Notes (Signed)
PT reports SHOb and wheezing. Pt recently admitted for flu. Pt also c/o bilateral toe pain. Pt reports she has wound on left great toe.

## 2023-04-10 NOTE — ED Provider Notes (Signed)
[place] EMERGENCY DEPARTMENT AT [facility] Provider Note   CSN: [id number] Arrival date & time: 04/10/23  1750     History  Chief Complaint  Patient presents with   Shortness of Breath    Kim Burns is a 88 y.o. female.  88 yo F with a chief complaints of not feeling well.  The patient has been sick for weeks now.  She is initially admitted for cellulitis and a nonhealing foot wound then was later diagnosed with the flu.  Was hospitalized in a different facility for that and has recently been home.  Not eating or drinking very well.  Continues to cough and be quite fatigued.  No appreciable fevers at home.  No abdominal pain.   Shortness of Breath      Home Medications Prior to Admission medications   Medication Sig Start Date End Date Taking? Authorizing Provider  acetaminophen (TYLENOL) 325 MG tablet Take 2 tablets (650 mg total) by mouth every 6 (six) hours as needed for mild pain (pain score 1-3), moderate pain (pain score 4-6), fever or headache. 01/02/23   Gillis Santa, MD  amantadine (SYMMETREL) 100 MG capsule Take 1 capsule (100 mg total) by mouth once a week. 11/20/22   Love, Evlyn Kanner, PA-C  amiodarone (PACERONE) 200 MG tablet Take 1 tablet (200 mg total) by mouth daily. 11/15/22   Love, Evlyn Kanner, PA-C  aspirin 81 MG chewable tablet Chew 1 tablet (81 mg total) by mouth daily. Patient taking differently: Chew 81 mg by mouth daily. Take 81mg  by mouth every morning except on dialysis days, hold in the morning and take after dialysis. 11/13/22   Love, Evlyn Kanner, PA-C  Azelastine HCl 137 MCG/SPRAY SOLN Place 1-2 sprays into both nostrils daily as needed (allergies). 11/29/22   [provider]  B Complex-C-Zn-Folic Acid (DIALYVITE 800 WITH ZINC) 0.8 MG TABS Take 1 tablet by mouth daily.    [provider]  cycloSPORINE (RESTASIS) 0.05 % ophthalmic emulsion Place 1 drop into both eyes daily as needed (irritation). Patient taking differently:  Place 1 drop into both eyes daily as needed (dryness). 11/13/22   Love, Evlyn Kanner, PA-C  diphenhydramine-acetaminophen (TYLENOL PM) 25-500 MG TABS tablet Take 2 tablets by mouth at bedtime.    [provider]  doxycycline (VIBRA-TABS) 100 MG tablet Take 1 tablet (100 mg total) by mouth 2 (two) times daily for 14 days. 03/29/23 04/12/23  Pokhrel, Rebekah Chesterfield, MD  hydrALAZINE (APRESOLINE) 50 MG tablet Take 50 mg by mouth every 8 (eight) hours as needed (for BP greater than 160).    [provider]  insulin aspart (NOVOLOG FLEXPEN) 100 UNIT/ML FlexPen Inject 5-7 Units into the skin 3 (three) times daily with meals. Sliding scale.    [provider]  lidocaine-prilocaine (EMLA) cream Apply 1 Application topically every Monday, Wednesday, and Friday with hemodialysis.    [provider]  lipase/protease/amylase (CREON) 36000 UNITS CPEP capsule Take 36,000 Units by mouth See admin instructions. Take 36,000 units (1 capsule) by mouth with meals and snacks.    [provider]  meclizine (ANTIVERT) 25 MG tablet Take 1 tablet (25 mg total) by mouth 3 (three) times daily as needed for dizziness. 01/02/23 01/02/24  Gillis Santa, MD  metoprolol succinate (TOPROL-XL) 50 MG 24 hr tablet Take 1 tablet (50 mg total) by mouth 4 (four) times a week. Take on nondialysis days, Tuesday, Thursday, Saturday and Sunday if systolic BP greater than 130 mmHg 04/10/23   [place],  Demetria Pore, MD  midodrine (PROAMATINE) 10 MG tablet Take 1 tablet (10 mg total) by mouth See admin instructions. Take 10mg  by mouth before dialysis. Bring with you to dialysis in case another dose is required. May take 10 mg 3 times a day if systolic BP less than 120 mmHg 01/02/23 01/02/24  Gillis Santa, MD  olopatadine (PATADAY) 0.1 % ophthalmic solution Place 1 drop into both eyes 2 (two) times daily as needed for allergies.    [provider]  rOPINIRole (REQUIP) 0.25 MG tablet Take 1 tablet (0.25 mg total) by  mouth 2 (two) times daily. Patient taking differently: Take 0.25-0.5 mg by mouth See admin instructions. Take 0.5mg  (1 tablet) by mouth every night at bedtime and 0.25mg  (1/2 tablet) during the day as needed. 09/24/22   Rodolph Bong, MD  sevelamer carbonate (RENVELA) 800 MG tablet Take 1 tablet (800 mg total) by mouth 3 (three) times daily with meals. Patient taking differently: Take 800 mg by mouth See admin instructions. Take 800mg  by mouth with meals or snacks. 11/13/22   Love, Evlyn Kanner, PA-C      Allergies    Lopid [gemfibrozil], Lotemax [loteprednol etabonate], Statins, Norco [hydrocodone-acetaminophen], Other, Vibra-tab [doxycycline], Amlodipine, Codeine, Hydrocodone, Hydrocodone-acetaminophen, Lisinopril, and Verapamil    Review of Systems   Review of Systems  Respiratory:  Positive for shortness of breath.     Physical Exam Updated Vital Signs BP (!) 115/97   Pulse 90   Temp (!) 97.4 F (36.3 C) (Oral)   Resp 20   SpO2 95%  Physical Exam Vitals and nursing note reviewed.  Constitutional:      General: She is not in acute distress.    Appearance: She is well-developed. She is not diaphoretic.     Comments: Chronically ill-appearing.  HENT:     Head: Normocephalic and atraumatic.  Eyes:     Pupils: Pupils are equal, round, and reactive to light.  Cardiovascular:     Rate and Rhythm: Normal rate and regular rhythm.     Heart sounds: No murmur heard.    No friction rub. No gallop.  Pulmonary:     Effort: Pulmonary effort is normal.     Breath sounds: No wheezing or rales.     Comments: Coarse breath sounds in all fields Abdominal:     General: There is no distension.     Palpations: Abdomen is soft.     Tenderness: There is no abdominal tenderness.  Musculoskeletal:        General: No tenderness.     Cervical back: Normal range of motion and neck supple.     Comments: Left lower extremity erythema has a ulceration to the medial aspect of the left second toe.   Some granulation tissue.  No obvious purulent drainage.  Foot is warm.  Difficult to palpate a pulse.  Dopplerable pulse.  Monophasic.  Skin:    General: Skin is warm and dry.  Neurological:     Mental Status: She is alert and oriented to person, place, and time.  Psychiatric:        Behavior: Behavior normal.     ED Results / Procedures / Treatments   Labs (all labs ordered are listed, but only abnormal results are displayed) Labs Reviewed  CBC WITH DIFFERENTIAL/PLATELET - Abnormal; Notable for the following components:      Result Value   WBC 13.3 (*)    Hemoglobin 11.0 (*)    MCHC 29.3 (*)    RDW  21.6 (*)    nRBC 1.1 (*)    Neutro Abs 11.8 (*)    Abs Immature Granulocytes 0.09 (*)    All other components within normal limits  COMPREHENSIVE METABOLIC PANEL - Abnormal; Notable for the following components:   CO2 21 (*)    Glucose, Bld 208 (*)    BUN 34 (*)    Creatinine, Ser 3.83 (*)    Calcium 8.1 (*)    Total Protein 5.9 (*)    Albumin 2.5 (*)    Total Bilirubin 1.3 (*)    GFR, Estimated 11 (*)    Anion gap 20 (*)    All other components within normal limits  BRAIN NATRIURETIC PEPTIDE - Abnormal; Notable for the following components:   B Natriuretic Peptide 2,195.1 (*)    All other components within normal limits  I-STAT CG4 LACTIC ACID, ED - Abnormal; Notable for the following components:   Lactic Acid, Venous 3.8 (*)    All other components within normal limits  I-STAT VENOUS BLOOD GAS, ED - Abnormal; Notable for the following components:   pH, Ven 7.484 (*)    pCO2, Ven 35.0 (*)    pO2, Ven 201 (*)    Acid-Base Excess 3.0 (*)    Calcium, Ion 0.84 (*)    All other components within normal limits  TROPONIN I (HIGH SENSITIVITY) - Abnormal; Notable for the following components:   Troponin I (High Sensitivity) 240 (*)    All other components within normal limits  I-STAT CG4 LACTIC ACID, ED    EKG None  Radiology DG Foot Complete Left Result Date:  04/10/2023 CLINICAL DATA:  Shortness of breath.  Bilateral toe pain. EXAM: LEFT FOOT - COMPLETE 3+ VIEW COMPARISON:  03/26/2023. FINDINGS: No acute fracture or dislocation. No aggressive osseous lesion. Mild-to-moderate hallux valgus deformity. Redemonstration of metallic K-wire/screw overlying the distal first and second metatarsals. Mild diffuse degenerative changes of imaged joints. There is flattening and fragmentation of the head of the third metatarsal, likely sequela of prior trauma versus avascular necrosis. No focal soft tissue swelling. No focal soft tissue defect or air within the soft tissue. No radiopaque foreign bodies. IMPRESSION: *No acute osseous abnormality of the left foot. Correlate clinically to determine the need for additional imaging with MRI. Electronically Signed   By: Jules Schick M.D.   On: 04/10/2023 18:45   DG Chest 2 View Result Date: 04/10/2023 CLINICAL DATA:  Shortness of breath. EXAM: CHEST - 2 VIEW COMPARISON:  03/26/2023. FINDINGS: Low lung volume. Bilateral lung fields are clear. Bilateral costophrenic angles are clear. Normal cardio-mediastinal silhouette. No acute osseous abnormalities. The soft tissues are within normal limits. Right sided central venous catheter noted with its tip overlying the right atrium, unchanged. IMPRESSION: No active cardiopulmonary disease. Electronically Signed   By: Jules Schick M.D.   On: 04/10/2023 18:39    Procedures .Critical Care  Performed by: Melene Plan, DO Authorized by: Melene Plan, DO   Critical care provider statement:    Critical care time (minutes):  80   Critical care time was exclusive of:  Separately billable procedures and treating other patients   Critical care was time spent personally by me on the following activities:  Development of treatment plan with patient or surrogate, discussions with consultants, evaluation of patient's response to treatment, examination of patient, ordering and review of laboratory  studies, ordering and review of radiographic studies, ordering and performing treatments and interventions, pulse oximetry, re-evaluation of patient's condition and review of  old charts   Care discussed with: admitting provider      Procedure note: Ultrasound Guided Peripheral IV Ultrasound guided peripheral 1.88 inch angiocath IV placement performed by me. Indications: Nursing unable to place IV. Details: The antecubital fossa and upper arm were evaluated with a multifrequency linear probe. Patent brachial veins were noted. 1 attempt was made to cannulate a vein under realtime US guidance with successful cannulation of the vein and catheter placement. There is return of non-pulsatile dark red blood. The patient tolerated the procedure well without complications. Images archived electronically.  CPT codes: 16109 and [phone]    Medications Ordered in ED Medications  vancomycin (VANCOCIN) IVPB 1000 mg/200 mL premix (1,000 mg Intravenous New Bag/Given 04/10/23 2128)  vancomycin (VANCOREADY) IVPB 500 mg/100 mL (has no administration in time range)  ceFEPIme (MAXIPIME) 1 g in sodium chloride 0.9 % 100 mL IVPB (has no administration in time range)  sodium chloride 0.9 % bolus 500 mL (0 mLs Intravenous Stopped 04/10/23 2054)  HYDROmorphone (DILAUDID) injection 0.25 mg (0.25 mg Intravenous Given 04/10/23 2126)  ondansetron (ZOFRAN) injection 4 mg (4 mg Intravenous Given 04/10/23 2126)    ED Course/ Medical Decision Making/ A&P                                 Medical Decision Making Amount and/or Complexity of Data Reviewed Labs: ordered.  Risk Prescription drug management.   88 yo F with a chief complaints of not feeling well.  Been ongoing for several weeks.  She was recently diagnosed with the flu.  Has been admitted twice this month already.  Does not seem to recover and family feels like she is doing worse at home.  Persistent cough.  Patient also has a nonhealing wound to the left foot.   Sounds like family thinks about is got a bit worse as well.  Chest x-ray without obvious focal infiltrates or pneumothorax on my independent interpretation.  No significant electrolyte abnormalities.  No acute anemia.  I had difficulty feeling a pulse in the left foot.  Patient did have documentation of a palpable pulse when she saw the orthopedist.  I am able to Doppler pulse here.  Difficult obtaining IV access.  Patient's lactate is elevated.  VBG without hypercarbia.  BNP and troponin are elevated.  Not sure the significance of this.  I suspect it may be demand ischemia.  Less likely may be viral myocarditis.  I discussed the case with the cardiology fellow, Dr. Geraldo Pitter.  Will see the patient at bedside.  I discussed case with Dr. Malen Gauze, nephrology will evaluate the patient tomorrow for possible dialysis.  Will discuss with medicine for admission.  The patients results and plan were reviewed and discussed.   Any x-rays performed were independently reviewed by myself.   Differential diagnosis were considered with the presenting HPI.  Medications  vancomycin (VANCOREADY) IVPB 500 mg/100 mL (has no administration in time range)  ceFEPIme (MAXIPIME) 1 g in sodium chloride 0.9 % 100 mL IVPB (has no administration in time range)  sodium chloride 0.9 % bolus 500 mL (0 mLs Intravenous Stopped 04/10/23 2054)  vancomycin (VANCOCIN) IVPB 1000 mg/200 mL premix (1,000 mg Intravenous New Bag/Given 04/10/23 2128)  HYDROmorphone (DILAUDID) injection 0.25 mg (0.25 mg Intravenous Given 04/10/23 2126)  ondansetron (ZOFRAN) injection 4 mg (4 mg Intravenous Given 04/10/23 2126)    Vitals:   04/10/23 1816 04/10/23 2045 04/10/23 2121  BP:  136/86 121/85 (!) 115/97  Pulse: 91  90  Resp: 19 (!) 24 20  Temp: (!) 97.3 F (36.3 C)  (!) 97.4 F (36.3 C)  TempSrc:   Oral  SpO2: 100%  95%    Final diagnoses:  Shortness of breath    Admission/ observation were discussed with the admitting physician,  patient and/or family and they are comfortable with the plan.          Final Clinical Impression(s) / ED Diagnoses Final diagnoses:  Shortness of breath    Rx / DC Orders ED Discharge Orders     None         Melene Plan, DO 04/10/23 2232

## 2023-04-10 NOTE — ED Notes (Signed)
Pt placed on 4L [place] - desatting to 77% s/p dilaudid - sats now trending up to 93%

## 2023-04-10 NOTE — Progress Notes (Signed)
PHARMACY - ANTICOAGULATION CONSULT NOTE  Pharmacy Consult for heparin infusion Indication: atrial fibrillation  Allergies  Allergen Reactions   Lopid [Gemfibrozil] Other (See Comments)    Myalgia    Lotemax [Loteprednol Etabonate] Rash    Skin burning  Blurry vision   Statins Other (See Comments)    Rosuvastatin Muscle weakness   Norco [Hydrocodone-Acetaminophen] Other (See Comments)    Headaches  Tolerates acetaminophen    Other Diarrhea    Real Butter - diarrhea   Vibra-Tab [Doxycycline] Other (See Comments)    Unknown reaction   Amlodipine Other (See Comments)    Norvasc   Codeine Other (See Comments)    headache   Hydrocodone Other (See Comments)   Hydrocodone-Acetaminophen Other (See Comments)    Headache. Tolerates acetaminophen.   Lisinopril Other (See Comments)   Verapamil Other (See Comments)    Patient Measurements:   Heparin Dosing Weight: 51 kg  Vital Signs: Temp: 97.4 F (36.3 C) (02/20 2121) Temp Source: Oral (02/20 2121) BP: 115/97 (02/20 2121) Pulse Rate: 90 (02/20 2121)  Labs: Recent Labs    04/10/23 1826 04/10/23 2017 04/10/23 2035  HGB 11.0*  --  14.3  HCT 37.6  --  42.0  PLT 198  --   --   CREATININE 3.83*  --   --   TROPONINIHS  --  240*  --     Estimated Creatinine Clearance: 8.6 mL/min (A) (by C-G formula based on SCr of 3.83 mg/dL (H)).   Medical History: Past Medical History:  Diagnosis Date   Anemia of chronic disease    takes iron   Anxiety    Atrial fibrillation (HCC)    Blood transfusion without reported diagnosis    Bradycardia    Diabetes mellitus without complication (HCC)    became diabetic after Whipple procedure   Diarrhea    Dysrhythmia 04/16/2016   bradycardia due to medication    ESRD on hemodialysis [facility])    M-W-F   GERD (gastroesophageal reflux disease)    Gout    Headache    History of kidney stones 06/2013   Hyperparathyroidism (HCC)    Hypertension    PONV (postoperative nausea and vomiting)     Sleep apnea    no cpap machine. could not tolerate   Stroke (HCC) 04/16/2016   TIA 1995   Vitamin D deficiency     Medications:  Scheduled:  Infusions:   ceFEPime (MAXIPIME) IV     [START ON 04/12/2023] vancomycin      Assessment: 88 yo F presenting with general malaise. Has been sick for a few weeks. Recently Flu positive. PMH significant for Afib, pancreatic neoplasm s/p Whipple, ESRD on HD MWF. Not on anticoagulation PTA due to risk of falls, CHADSVASC 9. Pharmacy consulted for heparin management.   Hgb 11, Plts 198. Will avoid bolus given advanced age and poor renal function.   Goal of Therapy:  Heparin level 0.3-0.7 units/ml Monitor platelets by anticoagulation protocol: Yes   Plan:  Start heparin infusion at 750 units/hr Check 8 hour heparin level Monitor daily heparin level, CBC, and signs/symptoms of bleeding  Lucus Lambertson B Tamma Brigandi 04/10/2023,10:37 PM

## 2023-04-10 NOTE — ED Provider Triage Note (Signed)
Emergency Medicine Provider Triage Evaluation Note  Kim Burns , a 88 y.o. female  was evaluated in triage.  Pt complains of nonproductive cough with shortness of breath and concern for wound of her left toes.  Patient states that she has chronic bilateral foot pain but has a wound on her left second toe that is not healing well and has swelling and redness in the area.  Patient does endorse purulent material from this but states she can still feel her toes.  Patient was recent hospitalized and discharged after being diagnosed with the flu but patient is concerned she has pneumonia.  Patient denies fevers, chest pain.  Review of Systems  Positive:  Negative:   Physical Exam  There were no vitals taken for this visit. Gen:   Awake, no distress   Resp:  Normal effort  MSK:   Moves extremities without difficulty  Other:  Appears sickly, left foot is erythematous with wound to second toe that appears to have some necrotic tissue with purulent material, cap refill less than 2 seconds, sensation tact distally  Medical Decision Making  Medically screening exam initiated at 6:11 PM.  Appropriate orders placed.  Kim Burns was informed that the remainder of the evaluation will be completed by another provider, this initial triage assessment does not replace that evaluation, and the importance of remaining in the ED until their evaluation is complete.  Workup initiated, charge nurse notified of patient and that they will need a room, patient stable at this time.   Netta Corrigan, [place] 04/10/23 [phone]

## 2023-04-10 NOTE — Consult Note (Signed)
Cardiology Consultation   Patient ID: Kim Burns MRN: [id number]; DOB: [birth year]  Admit date: 04/10/2023 Date of Consult: 04/10/2023  PCP:  Lewis Moccasin, MD   [facility] Providers Cardiologist:  Peter Swaziland, MD   { Click here to update MD or APP on Care Team, Refresh:1}     Patient Profile:   Kim Burns is a 88 y.o. female with a hx of paroxysmal atrial fibrillation (not on anticoagulation), orthostatic hypotension, ESRD on iHD M/W/F, intraductal papillary mucinous neoplasm of the pancreas s/p Whipple in 2018, type 2 diabetes mellitus who is being seen 04/10/2023 for the evaluation of atrial fibrillation with RVR and elevated troponin at the request of Dr. Adela Lank.  History of Present Illness:   Kim Burns has a complicated recent medical history which will be briefly summarized below.  Follows with Dr. Peter Swaziland in cardiology.  Refer to last clinic note from 02/13/23 for additional CV details.  08/2021: New onset atrial fibrillation with RVR, rate control and anticoagulation with Eliquis.  Cardiac catheterization due to elevated troponins in the setting of atrial fibrillation with RVR with nonobstructive coronary disease - no intervention.  Echo with normal LVEF, mild RV dysfunction, grade 3 diastolic dysfunction, no significant valvular disease. 10/2021: admitted with uro-sepsis with severe hydronephrosis and obstruction of the proximal ureter requiring stent placement and drain for left perinephric abscess. 01/2022: Admitted with severe flank pain and found to have a large left renal subcapsular hematoma, Eliquis was held 08/2022: Presented with weakness and fatigue, found to be in atrial fibrillation with RVR with heart rates in the 150s-170s, anticoagulation continued to be held. 10/2022: Experienced a fall with head strike.  MRI brain with small acute to early subacute ischemic, hemorrhagic infarct felt to be cardioembolic in the setting of atrial  fibrillation with RVR not on anticoagulation.  She was reloaded with amiodarone and started on low-dose metoprolol.  Echo at that time with LVEF 45-50%. 12/2022: admitted with LLE cellulitis, hypotension, severe deconditioning.  Required nephrostomy tube replacement, palliative care was consulted though patient declined. 03/2023: Admitted for cellulitis and acute osteomyelitis of the left second toe and discharged on p.o. antibiotics 03/2023: Admitted with flulike symptoms and diagnosed with influenza A  Today she presents to the emergency room with overall just not feeling well.  She was seen by someone who helps take care of her at home who recommended that she present to the emergency room.  She feels as though she never recovered from influenza and continues to have cough and congestion.  She states that she has not been eating or drinking well recently.  Her left leg continues to cause her some discomfort.  She feels dyspneic on exertion.  Notably, denies any chest discomfort shortness of breath at rest, palpitations or heart racing.  Confirms that she has not been taking any anticoagulation due to a history of clinically significant bleeding.  In the emergency room, temp 97, HR 90s, BP 115-136/86-97, requiring 4 L of O2 per [place].  WBC 13.3, Hgb 11.0, PLT 198.  No electrolyte derangements.  BNP 2195.  High-sensitivity troponin x 1 240.  Lactic acid 3.8.  CXR with clear lung fields and no effusion.  EKG with rate controlled atrial fibrillation, left axis deviation, anteroseptal Q waves, otherwise no ischemic ST segment or T wave changes.  In the emergency room, she received IV vancomycin and cefepime and 500cc of normal saline.  Past Medical History:  Diagnosis Date   Anemia of chronic  disease    takes iron   Anxiety    Atrial fibrillation (HCC)    Blood transfusion without reported diagnosis    Bradycardia    Diabetes mellitus without complication (HCC)    became diabetic after Whipple procedure    Diarrhea    Dysrhythmia 04/16/2016   bradycardia due to medication    ESRD on hemodialysis [facility])    M-W-F   GERD (gastroesophageal reflux disease)    Gout    Headache    History of kidney stones 06/2013   Hyperparathyroidism (HCC)    Hypertension    PONV (postoperative nausea and vomiting)    Sleep apnea    no cpap machine. could not tolerate   Stroke (HCC) 04/16/2016   TIA 1995   Vitamin D deficiency     Past Surgical History:  Procedure Laterality Date   A/V FISTULAGRAM Left 08/30/2021   Procedure: A/V Fistulagram;  Surgeon: Cephus Shelling, MD;  Location: [facility] INVASIVE CV LAB;  Service: Cardiovascular;  Laterality: Left;   A/V FISTULAGRAM N/A 02/25/2023   Procedure: A/V Fistulagram;  Surgeon: Maeola Harman, MD;  Location: [facility] INVASIVE CV LAB;  Service: Vascular;  Laterality: N/A;   ABDOMINAL HYSTERECTOMY  1985   complete   BACK SURGERY  1980   lower   BASCILIC VEIN TRANSPOSITION Left 04/24/2017   Procedure: LEFT ARM FIRST STAGE BASILIC VEIN TRANSPOSITION;  Surgeon: Nada Libman, MD;  Location: [facility] OR;  Service: Vascular;  Laterality: Left;   BASCILIC VEIN TRANSPOSITION Left 07/10/2017   Procedure: SECOND STAGE BASILIC VEIN TRANSPOSITION LEFT ARM;  Surgeon: Nada Libman, MD;  Location: [facility] OR;  Service: Vascular;  Laterality: Left;   BREAST LUMPECTOMY Left x 2   many years apart, benign   CHOLECYSTECTOMY  1985   CYSTOSCOPY W/ URETERAL STENT PLACEMENT Left 10/31/2021   Procedure: CYSTOSCOPY WITH RETROGRADE PYELOGRAM/URETERAL STENT PLACEMENT;  Surgeon: Crist Fat, MD;  Location: [facility] OR;  Service: Urology;  Laterality: Left;   CYSTOSCOPY WITH URETEROSCOPY AND STENT PLACEMENT Left 06/18/2013   Procedure: CYSTOSCOPY WITH Lef URETEROSCOPY AND Left STENT PLACEMENT;  Surgeon: Crecencio Mc, MD;  Location: [facility] ORS;  Service: Urology;  Laterality: Left;   DIALYSIS/PERMA CATHETER INSERTION N/A 03/13/2023   Procedure: DIALYSIS/PERMA CATHETER INSERTION;  Surgeon:  Dagoberto Ligas, MD;  Location[facility] INVASIVE CV LAB;  Service: Cardiovascular;  Laterality: N/A;   ESOPHAGOGASTRODUODENOSCOPY (EGD) WITH PROPOFOL N/A 11/13/2016   Procedure: ESOPHAGOGASTRODUODENOSCOPY (EGD) WITH PROPOFOL;  Surgeon: Kathi Der, MD;  Location: [facility] ENDOSCOPY;  Service: Gastroenterology;  Laterality: N/A;   EUS N/A 02/07/2016   Procedure: ESOPHAGEAL ENDOSCOPIC ULTRASOUND (EUS) RADIAL;  Surgeon: Willis Modena, MD;  Location: [facility] ENDOSCOPY;  Service: Endoscopy;  Laterality: N/A;   EYE SURGERY Bilateral 2014   ioc for catracts    FOOT SURGERY Left 1990   something with toes unsure what    HERNIA REPAIR  2018   IR CATHETER TUBE CHANGE  03/14/2022   IR EMBO TUMOR ORGAN ISCHEMIA INFARCT INC GUIDE ROADMAPPING  08/22/2021   IR NEPHROSTOMY EXCHANGE LEFT  06/27/2022   IR NEPHROSTOMY EXCHANGE LEFT  08/26/2022   IR NEPHROSTOMY EXCHANGE LEFT  10/22/2022   IR NEPHROSTOMY EXCHANGE LEFT  12/16/2022   IR NEPHROSTOMY EXCHANGE LEFT  12/30/2022   IR NEPHROSTOMY EXCHANGE LEFT  02/10/2023   IR NEPHROSTOMY EXCHANGE LEFT  04/07/2023   IR NEPHROSTOMY PLACEMENT LEFT  03/14/2022   IR RADIOLOGIST EVAL & MGMT  05/18/2021   IR RADIOLOGIST EVAL & MGMT  10/01/2021   IR RADIOLOGIST EVAL & MGMT  11/12/2021   IR RADIOLOGIST EVAL & MGMT  11/22/2021   IR RADIOLOGIST EVAL & MGMT  11/27/2021   IR RADIOLOGIST EVAL & MGMT  12/20/2021   IR RENAL SUPRASEL UNI S&I MOD SED  08/22/2021   IR US GUIDE VASC ACCESS RIGHT  08/22/2021   LEFT HEART CATH AND CORONARY ANGIOGRAPHY N/A 08/27/2021   Procedure: LEFT HEART CATH AND CORONARY ANGIOGRAPHY;  Surgeon: Kathleene Hazel, MD;  Location: [facility] INVASIVE CV LAB;  Service: Cardiovascular;  Laterality: N/A;   PARATHYROID EXPLORATION     PERIPHERAL VASCULAR BALLOON ANGIOPLASTY Left 08/30/2021   Procedure: PERIPHERAL VASCULAR BALLOON ANGIOPLASTY;  Surgeon: Cephus Shelling, MD;  Location: [facility] INVASIVE CV LAB;  Service: Cardiovascular;  Laterality: Left;  arm fistula    PERIPHERAL VASCULAR BALLOON ANGIOPLASTY Left 02/25/2023   Procedure: PERIPHERAL VASCULAR BALLOON ANGIOPLASTY;  Surgeon: Maeola Harman, MD;  Location: [facility] INVASIVE CV LAB;  Service: Vascular;  Laterality: Left;  left AVF   RADIOLOGY WITH ANESTHESIA Left 08/22/2021   Procedure: MICROWAVE ABLATION;  Surgeon: Simonne Come, MD;  Location: [facility] ORS;  Service: Anesthesiology;  Laterality: Left;   WHIPPLE PROCEDURE N/A 04/23/2016   Procedure: WHIPPLE PROCEDURE;  Surgeon: Almond Lint, MD;  Location: [facility] OR;  Service: General;  Laterality: N/A;     Home Medications:  Prior to Admission medications   Medication Sig Start Date End Date Taking? Authorizing Provider  acetaminophen (TYLENOL) 325 MG tablet Take 2 tablets (650 mg total) by mouth every 6 (six) hours as needed for mild pain (pain score 1-3), moderate pain (pain score 4-6), fever or headache. 01/02/23   Gillis Santa, MD  amantadine (SYMMETREL) 100 MG capsule Take 1 capsule (100 mg total) by mouth once a week. 11/20/22   Love, Evlyn Kanner, PA-C  amiodarone (PACERONE) 200 MG tablet Take 1 tablet (200 mg total) by mouth daily. 11/15/22   Love, Evlyn Kanner, PA-C  aspirin 81 MG chewable tablet Chew 1 tablet (81 mg total) by mouth daily. Patient taking differently: Chew 81 mg by mouth daily. Take 81mg  by mouth every morning except on dialysis days, hold in the morning and take after dialysis. 11/13/22   Love, Evlyn Kanner, PA-C  Azelastine HCl 137 MCG/SPRAY SOLN Place 1-2 sprays into both nostrils daily as needed (allergies). 11/29/22   [provider]  B Complex-C-Zn-Folic Acid (DIALYVITE 800 WITH ZINC) 0.8 MG TABS Take 1 tablet by mouth daily.    [provider]  cycloSPORINE (RESTASIS) 0.05 % ophthalmic emulsion Place 1 drop into both eyes daily as needed (irritation). Patient taking differently: Place 1 drop into both eyes daily as needed (dryness). 11/13/22   Love, Evlyn Kanner, PA-C  diphenhydramine-acetaminophen (TYLENOL PM) 25-500 MG TABS  tablet Take 2 tablets by mouth at bedtime.    [provider]  doxycycline (VIBRA-TABS) 100 MG tablet Take 1 tablet (100 mg total) by mouth 2 (two) times daily for 14 days. 03/29/23 04/12/23  Pokhrel, Rebekah Chesterfield, MD  hydrALAZINE (APRESOLINE) 50 MG tablet Take 50 mg by mouth every 8 (eight) hours as needed (for BP greater than 160).    [provider]  insulin aspart (NOVOLOG FLEXPEN) 100 UNIT/ML FlexPen Inject 5-7 Units into the skin 3 (three) times daily with meals. Sliding scale.    [provider]  lidocaine-prilocaine (EMLA) cream Apply 1 Application topically every Monday, Wednesday, and Friday with hemodialysis.    [provider]  lipase/protease/amylase (CREON) 36000 UNITS CPEP capsule Take  36,000 Units by mouth See admin instructions. Take 36,000 units (1 capsule) by mouth with meals and snacks.    [provider]  meclizine (ANTIVERT) 25 MG tablet Take 1 tablet (25 mg total) by mouth 3 (three) times daily as needed for dizziness. 01/02/23 01/02/24  Gillis Santa, MD  metoprolol succinate (TOPROL-XL) 50 MG 24 hr tablet Take 1 tablet (50 mg total) by mouth 4 (four) times a week. Take on nondialysis days, Tuesday, Thursday, Saturday and Sunday if systolic BP greater than 130 mmHg 04/10/23   [place], Peter M, MD  midodrine (PROAMATINE) 10 MG tablet Take 1 tablet (10 mg total) by mouth See admin instructions. Take 10mg  by mouth before dialysis. Bring with you to dialysis in case another dose is required. May take 10 mg 3 times a day if systolic BP less than 120 mmHg 01/02/23 01/02/24  Gillis Santa, MD  olopatadine (PATADAY) 0.1 % ophthalmic solution Place 1 drop into both eyes 2 (two) times daily as needed for allergies.    [provider]  rOPINIRole (REQUIP) 0.25 MG tablet Take 1 tablet (0.25 mg total) by mouth 2 (two) times daily. Patient taking differently: Take 0.25-0.5 mg by mouth See admin instructions. Take 0.5mg  (1 tablet) by mouth every night  at bedtime and 0.25mg  (1/2 tablet) during the day as needed. 09/24/22   Rodolph Bong, MD  sevelamer carbonate (RENVELA) 800 MG tablet Take 1 tablet (800 mg total) by mouth 3 (three) times daily with meals. Patient taking differently: Take 800 mg by mouth See admin instructions. Take 800mg  by mouth with meals or snacks. 11/13/22   Jacquelynn Cree, PA-C    Inpatient Medications: Scheduled Meds:  [START ON 04/11/2023] insulin aspart  0-6 Units Subcutaneous TID WC   Continuous Infusions:  ceFEPime (MAXIPIME) IV Stopped (04/10/23 2317)   heparin 750 Units/hr (04/10/23 2319)   [START ON 04/12/2023] vancomycin     PRN Meds: acetaminophen **OR** acetaminophen, melatonin, ondansetron (ZOFRAN) IV  Allergies:    Allergies  Allergen Reactions   Lopid [Gemfibrozil] Other (See Comments)    Myalgia    Lotemax [Loteprednol Etabonate] Rash    Skin burning  Blurry vision   Statins Other (See Comments)    Rosuvastatin Muscle weakness   Norco [Hydrocodone-Acetaminophen] Other (See Comments)    Headaches  Tolerates acetaminophen    Other Diarrhea    Real Butter - diarrhea   Vibra-Tab [Doxycycline] Other (See Comments)    Unknown reaction   Amlodipine Other (See Comments)    Norvasc   Codeine Other (See Comments)    headache   Hydrocodone Other (See Comments)   Hydrocodone-Acetaminophen Other (See Comments)    Headache. Tolerates acetaminophen.   Lisinopril Other (See Comments)   Verapamil Other (See Comments)    Social History:   Social History   Socioeconomic History   Marital status: Widowed    Spouse name: Not on file   Number of children: Not on file   Years of education: Not on file   Highest education level: Not on file  Occupational History   Not on file  Tobacco Use   Smoking status: Never   Smokeless tobacco: Never  Vaping Use   Vaping status: Never Used  Substance and Sexual Activity   Alcohol use: No   Drug use: No   Sexual activity: Not on file  Other Topics  Concern   Not on file  Social History Narrative   Not on file   Social Drivers of Health  Financial Resource Strain: Low Risk  (04/02/2023)   Received from [facility] of the [employer] Strain (CARDIA)    Difficulty of Paying Living Expenses: Not hard at all  Food Insecurity: No Food Insecurity (04/02/2023)   Received from [facility] of the [employer] Vital Sign    Worried About Running Out of Food in the Last Year: Never true    Ran Out of Food in the Last Year: Never true  Transportation Needs: No Transportation Needs (04/02/2023)   Received from [place] of the [employer] (Medical): No    Lack of Transportation (Non-Medical): No  Physical Activity: Inactive (02/06/2023)   Exercise Vital Sign    Days of Exercise per Week: 0 days    Minutes of Exercise per Session: 0 min  Stress: No Stress Concern Present (01/08/2023)   [employer] of Occupational Health - Occupational Stress Questionnaire    Feeling of Stress : Not at all  Social Connections: Socially Isolated (03/28/2023)   Social Connection and Isolation Panel [NHANES]    Frequency of Communication with Friends and Family: More than three times a week    Frequency of Social Gatherings with Friends and Family: More than three times a week    Attends Religious Services: Never    Database administrator or Organizations: No    Attends Banker Meetings: Never    Marital Status: Widowed  Intimate Partner Violence: Not At Risk (04/02/2023)   Received from [place] of the [employer], Afraid, Rape, and Kick questionnaire    Fear of Current or Ex-Partner: No    Emotionally Abused: No    Physically Abused: No    Sexually Abused: No    Family History:    Family History  Problem Relation Age of Onset   Heart disease Mother    Stroke Mother    Cancer Father        colon   Alzheimer's disease Sister     Alzheimer's disease Sister    Cancer Brother        brain   Breast cancer Neg Hx      ROS:  Please see the history of present illness.   All other ROS reviewed and negative.     Physical Exam/Data:   Vitals:   04/10/23 1816 04/10/23 2045 04/10/23 2121  BP: 136/86 121/85 (!) 115/97  Pulse: 91  90  Resp: 19 (!) 24 20  Temp: (!) 97.3 F (36.3 C)  (!) 97.4 F (36.3 C)  TempSrc:   Oral  SpO2: 100%  95%    Intake/Output Summary (Last 24 hours) at 04/10/2023 2320 Last data filed at 04/10/2023 2317 Gross per 24 hour  Intake 800 ml  Output --  Net 800 ml      03/28/2023   11:25 AM 03/28/2023    7:48 AM 03/28/2023    5:00 AM  Last 3 Weights  Weight (lbs) 112 lb 7 oz 117 lb 15.1 oz 122 lb  Weight (kg) 51 kg 53.5 kg 55.339 kg     There is no height or weight on file to calculate BMI.  General: Frail-appearing, disheveled HEENT: Dry mucosa, poor dentition Vascular: Unable to palpate distal pulses Cardiac: Tachycardic and irregularly irregular, no murmur Lungs: Breathing comfortably on 4 L per nasal cannula, rhonchi throughout posteriorly Abd: soft, nontender Ext: Asymmetric swelling of the left lower extremity Musculoskeletal:  No deformities Skin:  Erythematous rash of left lower extremity Neuro:  CNs 2-12 intact, no focal abnormalities noted  EKG:  The EKG was personally reviewed and demonstrates: Rate controlled atrial fibrillation with left axis deviation and anteroseptal Q waves Telemetry:  Telemetry was personally reviewed and demonstrates: Atrial fibrillation  Relevant CV Studies:  01/30/23 Echo IMPRESSIONS    1. Left ventricular ejection fraction, by estimation, is 60 to 65%. Left  ventricular ejection fraction by PLAX is 60 %. The left ventricle has  normal function. The left ventricle has no regional wall motion  abnormalities. Left ventricular diastolic  parameters are consistent with Grade III diastolic dysfunction  (restrictive).   2. Right ventricular  systolic function is low normal. The right  ventricular size is normal.   3. Left atrial size was moderately dilated.   4. Right atrial size was mildly dilated.   5. The mitral valve is abnormal. Mild mitral valve regurgitation.   6. The aortic valve is tricuspid. Aortic valve regurgitation is not  visualized. Aortic valve sclerosis/calcification is present, without any  evidence of aortic stenosis.   Comparison(s): Changes from prior study are noted. 10/25/2022: LVEF 45-50%.   08/2021 coronary angiogram   Prox RCA lesion is 20% stenosed.   Mid LM to Dist LM lesion is 20% stenosed.   Ost LAD lesion is 30% stenosed.   Mid Cx lesion is 20% stenosed.   Prox LAD lesion is 50% stenosed.   The left ventricular systolic function is normal.   LV end diastolic pressure is normal.   The left ventricular ejection fraction is 55-65% by visual estimate.   There is no mitral valve regurgitation.   Mild non-obstructive CAD Normal LV systolic function Elevated troponin and chest pain is likely due to demand ischemic in the setting of rapid atrial fibrillation.    Recommendation: Medical management of CAD.   Laboratory Data:  High Sensitivity Troponin:   Recent Labs  Lab 04/10/23 2017  TROPONINIHS 240*     Chemistry Recent Labs  Lab 04/10/23 1826 04/10/23 2035  NA 139 136  K 3.7 4.0  CL 98  --   CO2 21*  --   GLUCOSE 208*  --   BUN 34*  --   CREATININE 3.83*  --   CALCIUM 8.1*  --   GFRNONAA 11*  --   ANIONGAP 20*  --     Recent Labs  Lab 04/10/23 1826  PROT 5.9*  ALBUMIN 2.5*  AST 36  ALT 31  ALKPHOS 65  BILITOT 1.3*   Lipids No results for input(s): "CHOL", "TRIG", "HDL", "LABVLDL", "LDLCALC", "CHOLHDL" in the last 168 hours.  Hematology Recent Labs  Lab 04/10/23 1826 04/10/23 2035  WBC 13.3*  --   RBC 3.87  --   HGB 11.0* 14.3  HCT 37.6 42.0  MCV 97.2  --   MCH 28.4  --   MCHC 29.3*  --   RDW 21.6*  --   PLT 198  --    Thyroid No results for input(s):  "TSH", "FREET4" in the last 168 hours.  BNP Recent Labs  Lab 04/10/23 2017  BNP 2,195.1*    DDimer No results for input(s): "DDIMER" in the last 168 hours.   Radiology/Studies:  DG Foot Complete Left Result Date: 04/10/2023 CLINICAL DATA:  Shortness of breath.  Bilateral toe pain. EXAM: LEFT FOOT - COMPLETE 3+ VIEW COMPARISON:  03/26/2023. FINDINGS: No acute fracture or dislocation. No aggressive osseous lesion. Mild-to-moderate hallux valgus deformity. Redemonstration of metallic K-wire/screw overlying  the distal first and second metatarsals. Mild diffuse degenerative changes of imaged joints. There is flattening and fragmentation of the head of the third metatarsal, likely sequela of prior trauma versus avascular necrosis. No focal soft tissue swelling. No focal soft tissue defect or air within the soft tissue. No radiopaque foreign bodies. IMPRESSION: *No acute osseous abnormality of the left foot. Correlate clinically to determine the need for additional imaging with MRI. Electronically Signed   By: Jules Schick M.D.   On: 04/10/2023 18:45   DG Chest 2 View Result Date: 04/10/2023 CLINICAL DATA:  Shortness of breath. EXAM: CHEST - 2 VIEW COMPARISON:  03/26/2023. FINDINGS: Low lung volume. Bilateral lung fields are clear. Bilateral costophrenic angles are clear. Normal cardio-mediastinal silhouette. No acute osseous abnormalities. The soft tissues are within normal limits. Right sided central venous catheter noted with its tip overlying the right atrium, unchanged. IMPRESSION: No active cardiopulmonary disease. Electronically Signed   By: Jules Schick M.D.   On: 04/10/2023 18:39   IR NEPHROSTOMY EXCHANGE LEFT Result Date: 04/07/2023 INDICATION: Chronic left nephrostomy tube. Patient presents for routine exchange. EXAM: LEFT NEPHROSTOMY TUBE EXCHANGE WITH FLUOROSCOPY Physician: Rachelle Hora. Lowella Dandy, MD COMPARISON:  None Available. MEDICATIONS: 1% lidocaine ANESTHESIA/SEDATION: None CONTRAST:  10 mL  Omnipaque 300-administered into the collecting system(s) FLUOROSCOPY: Radiation Exposure Index (as provided by the fluoroscopic device): 1 mGy Kerma COMPLICATIONS: None immediate. PROCEDURE: The procedure was explained to the patient. The risks and benefits of the procedure were discussed and the patient's questions were addressed. Informed consent was obtained from the patient. Patient was placed prone. The existing catheter and surrounding skin were prepped and draped in sterile fashion. Maximal barrier sterile technique was utilized including caps, mask, sterile gowns, sterile gloves, sterile drape, hand hygiene and skin antiseptic. Contrast was injected through the nephrostomy tube. Nephrostomy tube was cut and removed over a wire. New 10 French multipurpose drain was advanced over the wire and reconstituted in the renal pelvis. Contrast injection confirmed placement in the renal pelvis. Skin was anesthetized with 1% lidocaine. Nephrostomy tube was sutured to skin. Nephrostomy tube was flushed with saline and attached to a gravity bag. Fluoroscopic images were taken and saved for this procedure. FINDINGS: Again noted is a small abnormal left renal collecting system with occlusion at the left renal pelvis. Nephrostomy tube is coiled in the renal pelvis. Again noted are embolization coils involving the left kidney. IMPRESSION: Successful exchange of left nephrostomy tube with fluoroscopy. Electronically Signed   By: Richarda Overlie M.D.   On: 04/07/2023 22:13     Assessment and Plan:   Acute myocardial injury Non-obstructive CAD Paroxysmal atrial fibrillation with RVR Presented with failure to thrive symptoms following recent influenza A and left lower extremity cellulitis, left second toe osteomyelitis found to be in atrial fibrillation with intermittent rapid ventricular response.  Her first high-sensitivity troponin is elevated though notably she is without chest discomfort or shortness of breath at rest.   She has known nonobstructive CAD and history of positive troponins when in atrial fibrillation with RVR.  Also found to have an elevated lactate to 3.8 which I suspect is related to infection and/or hypovolemia.  Taken together, elevated troponin is most consistent with supply/demand mismatch and she is without new chest discomfort symptoms or ischemic EKG changes.  Agree with IV fluid volume resuscitation at this time.  With lactic acidosis, would hold beta-blocker for now but can continue home amiodarone to assist with rate control.  Rate control will best be  achieved by treating the underlying driving factors.  If additional rate control is needed, can trial IV amiodarone 150 mg x 1.  Goal HR <110. CHA2DS2-VASc at least 7.  Has a known history of clinically significant bleeding while on anticoagulation and has been holding anticoagulation in the outpatient setting even with a history of likely cardioembolic stroke.  Would continue to hold anticoagulation in the setting. - Volume resuscitation and infection workup/abx per medicine - Continue home amiodarone  - Hold home metoprolol or BB for rate control until lactate has cleared - Hold AC for reasons stated above  Risk Assessment/Risk Scores:  {Complete the following score calculators/questions to meet required metrics.  Press F2         :[id number]}        CHA2DS2-VASc Score = 7  {Confirm score is correct.  If not, click here to update score.  REFRESH note.  :1} This indicates a 11.2% annual risk of stroke. The patient's score is based upon: CHF History: 0 HTN History: 1 Diabetes History: 1 Stroke History: 2 Vascular Disease History: 0 Age Score: 2 Gender Score: 1   {This patient has a significant risk of stroke if diagnosed with atrial fibrillation.  Please consider VKA or DOAC agent for anticoagulation if the bleeding risk is acceptable.   You can also use the SmartPhrase .[email or web address] for documentation.   :[id number]}      For  questions or updates, please contact [facility] Please consult [email or web address] for contact info under    Signed, Aundra Dubin, MD  04/10/2023 11:20 PM

## 2023-04-10 NOTE — H&P (Incomplete)
History and Physical      Kim Burns:[id number] DOB: 05-22-1935 DOA: 04/10/2023; DOS: 04/10/2023  PCP: Lewis Moccasin, MD *** Patient coming from: home ***  I have personally briefly reviewed patient's old medical records in [facility]  Chief Complaint: ***  HPI: Kim Burns is a 88 y.o. female with medical history significant for *** who is admitted to [facility] on 04/10/2023 with *** after presenting from home*** to [facility] ED complaining of ***.    ***       ***   ED Course:  Vital signs in the ED were notable for the following: ***  Labs were notable for the following: ***  Per my interpretation, EKG in ED demonstrated the following:  ***  Imaging in the ED, per corresponding formal radiology read, was notable for the following:  ***  EDP discussed patient's case with on-call cardiology, who are consulting and preliminarily feel that this elevation is on the basis of type II supply/demand mismatch.   While in the ED, the following were administered: ***  Subsequently, the patient was admitted  ***  ***red    Review of Systems: As per HPI otherwise 10 point review of systems negative.   Past Medical History:  Diagnosis Date   Anemia of chronic disease    takes iron   Anxiety    Atrial fibrillation (HCC)    Blood transfusion without reported diagnosis    Bradycardia    Diabetes mellitus without complication (HCC)    became diabetic after Whipple procedure   Diarrhea    Dysrhythmia 04/16/2016   bradycardia due to medication    ESRD on hemodialysis [facility])    M-W-F   GERD (gastroesophageal reflux disease)    Gout    Headache    History of kidney stones 06/2013   Hyperparathyroidism (HCC)    Hypertension    PONV (postoperative nausea and vomiting)    Sleep apnea    no cpap machine. could not tolerate   Stroke (HCC) 04/16/2016   TIA 1995   Vitamin D deficiency     Past Surgical History:  Procedure Laterality Date    A/V FISTULAGRAM Left 08/30/2021   Procedure: A/V Fistulagram;  Surgeon: Cephus Shelling, MD;  Location: [facility] INVASIVE CV LAB;  Service: Cardiovascular;  Laterality: Left;   A/V FISTULAGRAM N/A 02/25/2023   Procedure: A/V Fistulagram;  Surgeon: Maeola Harman, MD;  Location: [facility] INVASIVE CV LAB;  Service: Vascular;  Laterality: N/A;   ABDOMINAL HYSTERECTOMY  1985   complete   BACK SURGERY  1980   lower   BASCILIC VEIN TRANSPOSITION Left 04/24/2017   Procedure: LEFT ARM FIRST STAGE BASILIC VEIN TRANSPOSITION;  Surgeon: Nada Libman, MD;  Location: [facility] OR;  Service: Vascular;  Laterality: Left;   BASCILIC VEIN TRANSPOSITION Left 07/10/2017   Procedure: SECOND STAGE BASILIC VEIN TRANSPOSITION LEFT ARM;  Surgeon: Nada Libman, MD;  Location: [facility] OR;  Service: Vascular;  Laterality: Left;   BREAST LUMPECTOMY Left x 2   many years apart, benign   CHOLECYSTECTOMY  1985   CYSTOSCOPY W/ URETERAL STENT PLACEMENT Left 10/31/2021   Procedure: CYSTOSCOPY WITH RETROGRADE PYELOGRAM/URETERAL STENT PLACEMENT;  Surgeon: Crist Fat, MD;  Location: [facility] OR;  Service: Urology;  Laterality: Left;   CYSTOSCOPY WITH URETEROSCOPY AND STENT PLACEMENT Left 06/18/2013   Procedure: CYSTOSCOPY WITH Lef URETEROSCOPY AND Left STENT PLACEMENT;  Surgeon: Crecencio Mc, MD;  Location: [facility] ORS;  Service: Urology;  Laterality:  Left;   DIALYSIS/PERMA CATHETER INSERTION N/A 03/13/2023   Procedure: DIALYSIS/PERMA CATHETER INSERTION;  Surgeon: Dagoberto Ligas, MD;  Location[facility] INVASIVE CV LAB;  Service: Cardiovascular;  Laterality: N/A;   ESOPHAGOGASTRODUODENOSCOPY (EGD) WITH PROPOFOL N/A 11/13/2016   Procedure: ESOPHAGOGASTRODUODENOSCOPY (EGD) WITH PROPOFOL;  Surgeon: Kathi Der, MD;  Location: [facility] ENDOSCOPY;  Service: Gastroenterology;  Laterality: N/A;   EUS N/A 02/07/2016   Procedure: ESOPHAGEAL ENDOSCOPIC ULTRASOUND (EUS) RADIAL;  Surgeon: Willis Modena, MD;  Location: [facility] ENDOSCOPY;  Service: Endoscopy;   Laterality: N/A;   EYE SURGERY Bilateral 2014   ioc for catracts    FOOT SURGERY Left 1990   something with toes unsure what    HERNIA REPAIR  2018   IR CATHETER TUBE CHANGE  03/14/2022   IR EMBO TUMOR ORGAN ISCHEMIA INFARCT INC GUIDE ROADMAPPING  08/22/2021   IR NEPHROSTOMY EXCHANGE LEFT  06/27/2022   IR NEPHROSTOMY EXCHANGE LEFT  08/26/2022   IR NEPHROSTOMY EXCHANGE LEFT  10/22/2022   IR NEPHROSTOMY EXCHANGE LEFT  12/16/2022   IR NEPHROSTOMY EXCHANGE LEFT  12/30/2022   IR NEPHROSTOMY EXCHANGE LEFT  02/10/2023   IR NEPHROSTOMY EXCHANGE LEFT  04/07/2023   IR NEPHROSTOMY PLACEMENT LEFT  03/14/2022   IR RADIOLOGIST EVAL & MGMT  05/18/2021   IR RADIOLOGIST EVAL & MGMT  10/01/2021   IR RADIOLOGIST EVAL & MGMT  11/12/2021   IR RADIOLOGIST EVAL & MGMT  11/22/2021   IR RADIOLOGIST EVAL & MGMT  11/27/2021   IR RADIOLOGIST EVAL & MGMT  12/20/2021   IR RENAL SUPRASEL UNI S&I MOD SED  08/22/2021   IR US GUIDE VASC ACCESS RIGHT  08/22/2021   LEFT HEART CATH AND CORONARY ANGIOGRAPHY N/A 08/27/2021   Procedure: LEFT HEART CATH AND CORONARY ANGIOGRAPHY;  Surgeon: Kathleene Hazel, MD;  Location: [facility] INVASIVE CV LAB;  Service: Cardiovascular;  Laterality: N/A;   PARATHYROID EXPLORATION     PERIPHERAL VASCULAR BALLOON ANGIOPLASTY Left 08/30/2021   Procedure: PERIPHERAL VASCULAR BALLOON ANGIOPLASTY;  Surgeon: Cephus Shelling, MD;  Location: [facility] INVASIVE CV LAB;  Service: Cardiovascular;  Laterality: Left;  arm fistula   PERIPHERAL VASCULAR BALLOON ANGIOPLASTY Left 02/25/2023   Procedure: PERIPHERAL VASCULAR BALLOON ANGIOPLASTY;  Surgeon: Maeola Harman, MD;  Location: [facility] INVASIVE CV LAB;  Service: Vascular;  Laterality: Left;  left AVF   RADIOLOGY WITH ANESTHESIA Left 08/22/2021   Procedure: MICROWAVE ABLATION;  Surgeon: Simonne Come, MD;  Location: [facility] ORS;  Service: Anesthesiology;  Laterality: Left;   WHIPPLE PROCEDURE N/A 04/23/2016   Procedure: WHIPPLE PROCEDURE;  Surgeon: Almond Lint, MD;  Location: [facility] OR;  Service: General;  Laterality: N/A;    Social History:  reports that she has never smoked. She has never used smokeless tobacco. She reports that she does not drink alcohol and does not use drugs.   Allergies  Allergen Reactions   Lopid [Gemfibrozil] Other (See Comments)    Myalgia    Lotemax [Loteprednol Etabonate] Rash    Skin burning  Blurry vision   Statins Other (See Comments)    Rosuvastatin Muscle weakness   Norco [Hydrocodone-Acetaminophen] Other (See Comments)    Headaches  Tolerates acetaminophen    Other Diarrhea    Real Butter - diarrhea   Vibra-Tab [Doxycycline] Other (See Comments)    Unknown reaction   Amlodipine Other (See Comments)    Norvasc   Codeine Other (See Comments)    headache   Hydrocodone Other (See Comments)   Hydrocodone-Acetaminophen Other (See Comments)  Headache. Tolerates acetaminophen.   Lisinopril Other (See Comments)   Verapamil Other (See Comments)    Family History  Problem Relation Age of Onset   Heart disease Mother    Stroke Mother    Cancer Father        colon   Alzheimer's disease Sister    Alzheimer's disease Sister    Cancer Brother        brain   Breast cancer Neg Hx     Family history reviewed and not pertinent ***   Prior to Admission medications   Medication Sig Start Date End Date Taking? Authorizing Provider  acetaminophen (TYLENOL) 325 MG tablet Take 2 tablets (650 mg total) by mouth every 6 (six) hours as needed for mild pain (pain score 1-3), moderate pain (pain score 4-6), fever or headache. 01/02/23   Gillis Santa, MD  amantadine (SYMMETREL) 100 MG capsule Take 1 capsule (100 mg total) by mouth once a week. 11/20/22   Love, Evlyn Kanner, PA-C  amiodarone (PACERONE) 200 MG tablet Take 1 tablet (200 mg total) by mouth daily. 11/15/22   Love, Evlyn Kanner, PA-C  aspirin 81 MG chewable tablet Chew 1 tablet (81 mg total) by mouth daily. Patient taking differently: Chew 81 mg by mouth  daily. Take 81mg  by mouth every morning except on dialysis days, hold in the morning and take after dialysis. 11/13/22   Love, Evlyn Kanner, PA-C  Azelastine HCl 137 MCG/SPRAY SOLN Place 1-2 sprays into both nostrils daily as needed (allergies). 11/29/22   [provider]  B Complex-C-Zn-Folic Acid (DIALYVITE 800 WITH ZINC) 0.8 MG TABS Take 1 tablet by mouth daily.    [provider]  cycloSPORINE (RESTASIS) 0.05 % ophthalmic emulsion Place 1 drop into both eyes daily as needed (irritation). Patient taking differently: Place 1 drop into both eyes daily as needed (dryness). 11/13/22   Love, Evlyn Kanner, PA-C  diphenhydramine-acetaminophen (TYLENOL PM) 25-500 MG TABS tablet Take 2 tablets by mouth at bedtime.    [provider]  doxycycline (VIBRA-TABS) 100 MG tablet Take 1 tablet (100 mg total) by mouth 2 (two) times daily for 14 days. 03/29/23 04/12/23  Pokhrel, Rebekah Chesterfield, MD  hydrALAZINE (APRESOLINE) 50 MG tablet Take 50 mg by mouth every 8 (eight) hours as needed (for BP greater than 160).    [provider]  insulin aspart (NOVOLOG FLEXPEN) 100 UNIT/ML FlexPen Inject 5-7 Units into the skin 3 (three) times daily with meals. Sliding scale.    [provider]  lidocaine-prilocaine (EMLA) cream Apply 1 Application topically every Monday, Wednesday, and Friday with hemodialysis.    [provider]  lipase/protease/amylase (CREON) 36000 UNITS CPEP capsule Take 36,000 Units by mouth See admin instructions. Take 36,000 units (1 capsule) by mouth with meals and snacks.    [provider]  meclizine (ANTIVERT) 25 MG tablet Take 1 tablet (25 mg total) by mouth 3 (three) times daily as needed for dizziness. 01/02/23 01/02/24  Gillis Santa, MD  metoprolol succinate (TOPROL-XL) 50 MG 24 hr tablet Take 1 tablet (50 mg total) by mouth 4 (four) times a week. Take on nondialysis days, Tuesday, Thursday, Saturday and Sunday if systolic BP greater than 130 mmHg 04/10/23    [place], Peter M, MD  midodrine (PROAMATINE) 10 MG tablet Take 1 tablet (10 mg total) by mouth See admin instructions. Take 10mg  by mouth before dialysis. Bring with you to dialysis in case another dose is required. May take 10 mg 3 times a day if systolic  BP less than 120 mmHg 01/02/23 01/02/24  Gillis Santa, MD  olopatadine (PATADAY) 0.1 % ophthalmic solution Place 1 drop into both eyes 2 (two) times daily as needed for allergies.    [provider]  rOPINIRole (REQUIP) 0.25 MG tablet Take 1 tablet (0.25 mg total) by mouth 2 (two) times daily. Patient taking differently: Take 0.25-0.5 mg by mouth See admin instructions. Take 0.5mg  (1 tablet) by mouth every night at bedtime and 0.25mg  (1/2 tablet) during the day as needed. 09/24/22   Rodolph Bong, MD  sevelamer carbonate (RENVELA) 800 MG tablet Take 1 tablet (800 mg total) by mouth 3 (three) times daily with meals. Patient taking differently: Take 800 mg by mouth See admin instructions. Take 800mg  by mouth with meals or snacks. 11/13/22   Jacquelynn Cree, PA-C     Objective    Physical Exam: Vitals:   04/10/23 1816 04/10/23 2045 04/10/23 2121  BP: 136/86 121/85 (!) 115/97  Pulse: 91  90  Resp: 19 (!) 24 20  Temp: (!) 97.3 F (36.3 C)  (!) 97.4 F (36.3 C)  TempSrc:   Oral  SpO2: 100%  95%    General: appears to be stated age; alert, oriented Skin: warm, dry, no rash Head:  AT/Severn Mouth:  Oral mucosa membranes appear moist, normal dentition Neck: supple; trachea midline Heart:  RRR; did not appreciate any M/R/G Lungs: CTAB, did not appreciate any wheezes, rales, or rhonchi Abdomen: + BS; soft, ND, NT Vascular: 2+ pedal pulses b/l; 2+ radial pulses b/l Extremities: no peripheral edema, no muscle wasting Neuro: strength and sensation intact in upper and lower extremities b/l ***   *** Neuro: 5/5 strength of the proximal and distal flexors and extensors of the upper and lower extremities bilaterally; sensation intact  in upper and lower extremities b/l; cranial nerves II through XII grossly intact; no pronator drift; no evidence suggestive of slurred speech, dysarthria, or facial droop; Normal muscle tone. No tremors.  *** Neuro: In the setting of the patient's current mental status and associated inability to follow instructions, unable to perform full neurologic exam at this time.  As such, assessment of strength, sensation, and cranial nerves is limited at this time. Patient noted to spontaneously move all 4 extremities. No tremors.  ***    Labs on Admission: I have personally reviewed following labs and imaging studies  CBC: Recent Labs  Lab 04/10/23 1826 04/10/23 2035  WBC 13.3*  --   NEUTROABS 11.8*  --   HGB 11.0* 14.3  HCT 37.6 42.0  MCV 97.2  --   PLT 198  --    Basic Metabolic Panel: Recent Labs  Lab 04/10/23 1826 04/10/23 2035  NA 139 136  K 3.7 4.0  CL 98  --   CO2 21*  --   GLUCOSE 208*  --   BUN 34*  --   CREATININE 3.83*  --   CALCIUM 8.1*  --    GFR: Estimated Creatinine Clearance: 8.6 mL/min (A) (by C-G formula based on SCr of 3.83 mg/dL (H)). Liver Function Tests: Recent Labs  Lab 04/10/23 1826  AST 36  ALT 31  ALKPHOS 65  BILITOT 1.3*  PROT 5.9*  ALBUMIN 2.5*   No results for input(s): "LIPASE", "AMYLASE" in the last 168 hours. No results for input(s): "AMMONIA" in the last 168 hours. Coagulation Profile: No results for input(s): "INR", "PROTIME" in the last 168 hours. Cardiac Enzymes: No results for input(s): "CKTOTAL", "CKMB", "CKMBINDEX", "TROPONINI" in the  last 168 hours. BNP (last 3 results) No results for input(s): "PROBNP" in the last 8760 hours. HbA1C: No results for input(s): "HGBA1C" in the last 72 hours. CBG: No results for input(s): "GLUCAP" in the last 168 hours. Lipid Profile: No results for input(s): "CHOL", "HDL", "LDLCALC", "TRIG", "CHOLHDL", "LDLDIRECT" in the last 72 hours. Thyroid Function Tests: No results for input(s): "TSH",  "T4TOTAL", "FREET4", "T3FREE", "THYROIDAB" in the last 72 hours. Anemia Panel: No results for input(s): "VITAMINB12", "FOLATE", "FERRITIN", "TIBC", "IRON", "RETICCTPCT" in the last 72 hours. Urine analysis:    Component Value Date/Time   COLORURINE YELLOW 03/26/2023 1810   APPEARANCEUR HAZY (A) 03/26/2023 1810   LABSPEC 1.011 03/26/2023 1810   PHURINE 8.0 03/26/2023 1810   GLUCOSEU 100 (A) 03/26/2023 1810   HGBUR TRACE (A) 03/26/2023 1810   BILIRUBINUR NEGATIVE 03/26/2023 1810   KETONESUR NEGATIVE 03/26/2023 1810   PROTEINUR 100 (A) 03/26/2023 1810   UROBILINOGEN 1.0 06/18/2013 1633   NITRITE NEGATIVE 03/26/2023 1810   LEUKOCYTESUR NEGATIVE 03/26/2023 1810    Radiological Exams on Admission: DG Foot Complete Left Result Date: 04/10/2023 CLINICAL DATA:  Shortness of breath.  Bilateral toe pain. EXAM: LEFT FOOT - COMPLETE 3+ VIEW COMPARISON:  03/26/2023. FINDINGS: No acute fracture or dislocation. No aggressive osseous lesion. Mild-to-moderate hallux valgus deformity. Redemonstration of metallic K-wire/screw overlying the distal first and second metatarsals. Mild diffuse degenerative changes of imaged joints. There is flattening and fragmentation of the head of the third metatarsal, likely sequela of prior trauma versus avascular necrosis. No focal soft tissue swelling. No focal soft tissue defect or air within the soft tissue. No radiopaque foreign bodies. IMPRESSION: *No acute osseous abnormality of the left foot. Correlate clinically to determine the need for additional imaging with MRI. Electronically Signed   By: Jules Schick M.D.   On: 04/10/2023 18:45   DG Chest 2 View Result Date: 04/10/2023 CLINICAL DATA:  Shortness of breath. EXAM: CHEST - 2 VIEW COMPARISON:  03/26/2023. FINDINGS: Low lung volume. Bilateral lung fields are clear. Bilateral costophrenic angles are clear. Normal cardio-mediastinal silhouette. No acute osseous abnormalities. The soft tissues are within normal limits.  Right sided central venous catheter noted with its tip overlying the right atrium, unchanged. IMPRESSION: No active cardiopulmonary disease. Electronically Signed   By: Jules Schick M.D.   On: 04/10/2023 18:39      Assessment/Plan   Principal Problem:   Generalized weakness   ***            ***                ***                 ***               ***               ***               ***                ***               ***               ***               ***               ***              ***          ***  DVT prophylaxis: SCD's ***  Code Status: Full code*** Family Communication: none*** Disposition Plan: Per Rounding Team Consults called: none***;  Admission status: ***     I SPENT GREATER THAN 75 *** MINUTES IN CLINICAL CARE TIME/MEDICAL DECISION-MAKING IN COMPLETING THIS ADMISSION.      Chaney Born Cartez Mogle DO [facility]  From 7PM - 7AM   04/10/2023, 11:07 PM   ***

## 2023-04-10 NOTE — ED Notes (Signed)
Dr. Adela Lank notified of critical value -troponin

## 2023-04-10 NOTE — Progress Notes (Signed)
Pharmacy Antibiotic Note  Kim Burns is a 88 y.o. female admitted on 04/10/2023 with  osteomyelitis .  Pharmacy has been consulted for cefepime and vancomycin dosing.  WBC 13.3, Temp 97.8F, Scr 3.83. Patient received vancomycin 1000 mg load in ED.   Plan: Vancomycin 500 mg q48h (Scr 3.83, Vd 0.72, eAUC 588) Cefepime 1g IV q24h  Monitor renal function, clinical status, and culture data     Temp (24hrs), Avg:97.3 F (36.3 C), Min:97.3 F (36.3 C), Max:97.3 F (36.3 C)  Recent Labs  Lab 04/10/23 1826 04/10/23 2035  WBC 13.3*  --   CREATININE 3.83*  --   LATICACIDVEN  --  3.8*    Estimated Creatinine Clearance: 8.6 mL/min (A) (by C-G formula based on SCr of 3.83 mg/dL (H)).    Allergies  Allergen Reactions   Lopid [Gemfibrozil] Other (See Comments)    Myalgia    Lotemax [Loteprednol Etabonate] Rash    Skin burning  Blurry vision   Statins Other (See Comments)    Rosuvastatin Muscle weakness   Norco [Hydrocodone-Acetaminophen] Other (See Comments)    Headaches  Tolerates acetaminophen    Other Diarrhea    Real Butter - diarrhea   Vibra-Tab [Doxycycline] Other (See Comments)    Unknown reaction   Amlodipine Other (See Comments)    Norvasc   Codeine Other (See Comments)    headache   Hydrocodone Other (See Comments)   Hydrocodone-Acetaminophen Other (See Comments)    Headache. Tolerates acetaminophen.   Lisinopril Other (See Comments)   Verapamil Other (See Comments)    Antimicrobials this admission: Vancomycin 2/20 >> cefepime 2/20 >>   Microbiology results: N/a  Thank you for allowing pharmacy to be a part of this patient's care.  Burnett Kanaris 04/10/2023 8:56 PM

## 2023-04-11 ENCOUNTER — Inpatient Hospital Stay (HOSPITAL_COMMUNITY): Payer: Medicare PPO

## 2023-04-11 ENCOUNTER — Encounter (HOSPITAL_COMMUNITY): Payer: Medicare PPO

## 2023-04-11 ENCOUNTER — Encounter (HOSPITAL_COMMUNITY): Payer: Self-pay | Admitting: Internal Medicine

## 2023-04-11 DIAGNOSIS — I4891 Unspecified atrial fibrillation: Secondary | ICD-10-CM

## 2023-04-11 DIAGNOSIS — J9601 Acute respiratory failure with hypoxia: Secondary | ICD-10-CM | POA: Diagnosis not present

## 2023-04-11 DIAGNOSIS — I5032 Chronic diastolic (congestive) heart failure: Secondary | ICD-10-CM

## 2023-04-11 DIAGNOSIS — A419 Sepsis, unspecified organism: Secondary | ICD-10-CM | POA: Diagnosis not present

## 2023-04-11 DIAGNOSIS — N186 End stage renal disease: Secondary | ICD-10-CM | POA: Diagnosis not present

## 2023-04-11 DIAGNOSIS — J9622 Acute and chronic respiratory failure with hypercapnia: Secondary | ICD-10-CM

## 2023-04-11 DIAGNOSIS — M8610 Other acute osteomyelitis, unspecified site: Secondary | ICD-10-CM | POA: Diagnosis present

## 2023-04-11 DIAGNOSIS — J9602 Acute respiratory failure with hypercapnia: Secondary | ICD-10-CM

## 2023-04-11 DIAGNOSIS — R531 Weakness: Secondary | ICD-10-CM | POA: Diagnosis not present

## 2023-04-11 DIAGNOSIS — E872 Acidosis, unspecified: Secondary | ICD-10-CM | POA: Diagnosis not present

## 2023-04-11 DIAGNOSIS — R6520 Severe sepsis without septic shock: Secondary | ICD-10-CM | POA: Diagnosis not present

## 2023-04-11 DIAGNOSIS — J9621 Acute and chronic respiratory failure with hypoxia: Secondary | ICD-10-CM

## 2023-04-11 LAB — I-STAT ARTERIAL BLOOD GAS, ED
Acid-Base Excess: 0 mmol/L (ref 0.0–2.0)
Acid-Base Excess: 0 mmol/L (ref 0.0–2.0)
Acid-Base Excess: 0 mmol/L (ref 0.0–2.0)
Bicarbonate: 29.9 mmol/L — ABNORMAL HIGH (ref 20.0–28.0)
Bicarbonate: 28.5 mmol/L — ABNORMAL HIGH (ref 20.0–28.0)
Bicarbonate: 28.9 mmol/L — ABNORMAL HIGH (ref 20.0–28.0)
Calcium, Ion: 1.17 mmol/L (ref 1.15–1.40)
Calcium, Ion: 1.16 mmol/L (ref 1.15–1.40)
Calcium, Ion: 1.13 mmol/L — ABNORMAL LOW (ref 1.15–1.40)
HCT: 37 % (ref 36.0–46.0)
HCT: 38 % (ref 36.0–46.0)
HCT: 38 % (ref 36.0–46.0)
Hemoglobin: 12.6 g/dL (ref 12.0–15.0)
Hemoglobin: 12.9 g/dL (ref 12.0–15.0)
Hemoglobin: 12.9 g/dL (ref 12.0–15.0)
O2 Saturation: 94 %
O2 Saturation: 92 %
O2 Saturation: 94 %
Patient temperature: 98.3
Potassium: 3.8 mmol/L (ref 3.5–5.1)
Potassium: 4 mmol/L (ref 3.5–5.1)
Potassium: 4.1 mmol/L (ref 3.5–5.1)
Sodium: 139 mmol/L (ref 135–145)
Sodium: 139 mmol/L (ref 135–145)
Sodium: 138 mmol/L (ref 135–145)
TCO2: 32 mmol/L (ref 22–32)
TCO2: 30 mmol/L (ref 22–32)
TCO2: 31 mmol/L (ref 22–32)
pCO2 arterial: 74.8 mm[Hg] (ref 32–48)
pCO2 arterial: 60.9 mm[Hg] — ABNORMAL HIGH (ref 32–48)
pCO2 arterial: 68.5 mm[Hg] (ref 32–48)
pH, Arterial: 7.209 — ABNORMAL LOW (ref 7.35–7.45)
pH, Arterial: 7.278 — ABNORMAL LOW (ref 7.35–7.45)
pH, Arterial: 7.232 — ABNORMAL LOW (ref 7.35–7.45)
pO2, Arterial: 91 mm[Hg] (ref 83–108)
pO2, Arterial: 74 mm[Hg] — ABNORMAL LOW (ref 83–108)
pO2, Arterial: 86 mm[Hg] (ref 83–108)

## 2023-04-11 LAB — RESPIRATORY PANEL BY PCR

## 2023-04-11 LAB — CBC
HCT: 38.2 % (ref 36.0–46.0)
HCT: 37.2 % (ref 36.0–46.0)
Hemoglobin: 10.8 g/dL — ABNORMAL LOW (ref 12.0–15.0)
Hemoglobin: 11.1 g/dL — ABNORMAL LOW (ref 12.0–15.0)
MCH: 28.4 pg (ref 26.0–34.0)
MCH: 28.2 pg (ref 26.0–34.0)
MCHC: 28.3 g/dL — ABNORMAL LOW (ref 30.0–36.0)
MCHC: 29.8 g/dL — ABNORMAL LOW (ref 30.0–36.0)
MCV: 100.5 fL — ABNORMAL HIGH (ref 80.0–100.0)
MCV: 94.7 fL (ref 80.0–100.0)
Platelets: 200 10*3/uL (ref 150–400)
Platelets: 235 10*3/uL (ref 150–400)
RBC: 3.8 MIL/uL — ABNORMAL LOW (ref 3.87–5.11)
RBC: 3.93 MIL/uL (ref 3.87–5.11)
RDW: 21.7 % — ABNORMAL HIGH (ref 11.5–15.5)
RDW: 22.4 % — ABNORMAL HIGH (ref 11.5–15.5)
WBC: 14.6 10*3/uL — ABNORMAL HIGH (ref 4.0–10.5)
WBC: 16.4 10*3/uL — ABNORMAL HIGH (ref 4.0–10.5)
nRBC: 1.6 % — ABNORMAL HIGH (ref 0.0–0.2)
nRBC: 1.4 % — ABNORMAL HIGH (ref 0.0–0.2)

## 2023-04-11 LAB — COMPREHENSIVE METABOLIC PANEL
ALT: 29 U/L (ref 0–44)
AST: 24 U/L (ref 15–41)
Albumin: 2.3 g/dL — ABNORMAL LOW (ref 3.5–5.0)
Alkaline Phosphatase: 61 U/L (ref 38–126)
Anion gap: 18 — ABNORMAL HIGH (ref 5–15)
BUN: 35 mg/dL — ABNORMAL HIGH (ref 8–23)
CO2: 22 mmol/L (ref 22–32)
Calcium: 8.1 mg/dL — ABNORMAL LOW (ref 8.9–10.3)
Chloride: 99 mmol/L (ref 98–111)
Creatinine, Ser: 3.88 mg/dL — ABNORMAL HIGH (ref 0.44–1.00)
GFR, Estimated: 11 mL/min — ABNORMAL LOW (ref >60)
Glucose, Bld: 107 mg/dL — ABNORMAL HIGH (ref 70–99)
Potassium: 4.1 mmol/L (ref 3.5–5.1)
Sodium: 139 mmol/L (ref 135–145)
Total Bilirubin: 1.2 mg/dL (ref 0.0–1.2)
Total Protein: 5.4 g/dL — ABNORMAL LOW (ref 6.5–8.1)

## 2023-04-11 LAB — BLOOD GAS, ARTERIAL
Acid-Base Excess: 1.2 mmol/L (ref 0.0–2.0)
Bicarbonate: 30.1 mmol/L — ABNORMAL HIGH (ref 20.0–28.0)
Drawn by: 336832
O2 Saturation: 98.7 %
Patient temperature: 37
pCO2 arterial: 67 mm[Hg] (ref 32–48)
pH, Arterial: 7.26 — ABNORMAL LOW (ref 7.35–7.45)
pO2, Arterial: 127 mm[Hg] — ABNORMAL HIGH (ref 83–108)

## 2023-04-11 LAB — SEDIMENTATION RATE: Sed Rate: 3 mm/h (ref 0–22)

## 2023-04-11 LAB — GLUCOSE, CAPILLARY
Glucose-Capillary: 17 mg/dL — CL (ref 70–99)
Glucose-Capillary: <10 mg/dL — CL (ref 70–99)
Glucose-Capillary: 141 mg/dL — ABNORMAL HIGH (ref 70–99)

## 2023-04-11 LAB — RESP PANEL BY RT-PCR (RSV, FLU A&B, COVID)  RVPGX2
Influenza A by PCR: POSITIVE — AB
Influenza B by PCR: NEGATIVE
Resp Syncytial Virus by PCR: NEGATIVE
SARS Coronavirus 2 by RT PCR: NEGATIVE

## 2023-04-11 LAB — LACTIC ACID, PLASMA: Lactic Acid, Venous: 2.1 mmol/L (ref 0.5–1.9)

## 2023-04-11 LAB — TROPONIN I (HIGH SENSITIVITY)
Troponin I (High Sensitivity): 223 ng/L (ref <18)
Troponin I (High Sensitivity): 173 ng/L (ref <18)

## 2023-04-11 LAB — MAGNESIUM
Magnesium: 2.3 mg/dL (ref 1.7–2.4)
Magnesium: 2.4 mg/dL (ref 1.7–2.4)

## 2023-04-11 LAB — C-REACTIVE PROTEIN: CRP: <0.5 mg/dL (ref <1.0)

## 2023-04-11 LAB — PHOSPHORUS: Phosphorus: 7.6 mg/dL — ABNORMAL HIGH (ref 2.5–4.6)

## 2023-04-11 LAB — HEPARIN LEVEL (UNFRACTIONATED)
Heparin Unfractionated: <0.1 [IU]/mL — ABNORMAL LOW (ref 0.30–0.70)
Heparin Unfractionated: 0.11 [IU]/mL — ABNORMAL LOW (ref 0.30–0.70)

## 2023-04-11 LAB — BRAIN NATRIURETIC PEPTIDE: B Natriuretic Peptide: 2179.6 pg/mL — ABNORMAL HIGH (ref 0.0–100.0)

## 2023-04-11 LAB — ECHOCARDIOGRAM LIMITED
Calc EF: 51.7 %
MV VTI: 1.32 cm2
Single Plane A2C EF: 49.2 %
Single Plane A4C EF: 53.1 %

## 2023-04-11 LAB — VITAMIN B12: Vitamin B-12: 1012 pg/mL — ABNORMAL HIGH (ref 180–914)

## 2023-04-11 LAB — I-STAT CG4 LACTIC ACID, ED: Lactic Acid, Venous: 3.1 mmol/L (ref 0.5–1.9)

## 2023-04-11 LAB — PROCALCITONIN: Procalcitonin: 0.53 ng/mL

## 2023-04-11 LAB — CBG MONITORING, ED
Glucose-Capillary: 74 mg/dL (ref 70–99)
Glucose-Capillary: 79 mg/dL (ref 70–99)

## 2023-04-11 LAB — TSH: TSH: 2.488 u[IU]/mL (ref 0.350–4.500)

## 2023-04-11 MED ORDER — HEPARIN SODIUM (PORCINE) 1000 UNIT/ML DIALYSIS
1000.0000 [IU] | INTRAMUSCULAR | Status: DC | PRN
  Administered 2023-04-11: 3200 [IU]

## 2023-04-11 MED ORDER — ALBUMIN HUMAN 25 % IV SOLN
25.0000 g | INTRAVENOUS | Status: DC | PRN
  Administered 2023-04-11: 25 g via INTRAVENOUS

## 2023-04-11 MED ORDER — PANCRELIPASE (LIP-PROT-AMYL) 36000-114000 UNITS PO CPEP
36000.0000 [IU] | ORAL_CAPSULE | Freq: Three times a day (TID) | ORAL | Status: DC
  Administered 2023-04-11 – 2023-04-19 (×18): 36000 [IU] via ORAL
  Filled 2023-04-11 (×22): qty 1

## 2023-04-11 MED ORDER — DEXTROSE 50 % IV SOLN
25.0000 g | INTRAVENOUS | Status: AC
  Administered 2023-04-11: 25 g via INTRAVENOUS
  Filled 2023-04-11: qty 50

## 2023-04-11 MED ORDER — SEVELAMER CARBONATE 800 MG PO TABS: 800.0000 mg | ORAL_TABLET | Freq: Three times a day (TID) | ORAL | Status: DC

## 2023-04-11 MED ORDER — SEVELAMER CARBONATE 800 MG PO TABS
800.0000 mg | ORAL_TABLET | Freq: Three times a day (TID) | ORAL | Status: DC
  Administered 2023-04-11 – 2023-04-19 (×18): 800 mg via ORAL
  Filled 2023-04-11 (×19): qty 1

## 2023-04-11 MED ORDER — DOXERCALCIFEROL 4 MCG/2ML IV SOLN
3.0000 ug | INTRAVENOUS | Status: DC
  Administered 2023-04-11 – 2023-04-18 (×2): 3 ug via INTRAVENOUS
  Filled 2023-04-11 (×6): qty 2

## 2023-04-11 MED ORDER — RENA-VITE PO TABS
1.0000 | ORAL_TABLET | Freq: Every day | ORAL | Status: DC
  Administered 2023-04-11 – 2023-04-18 (×8): 1 via ORAL
  Filled 2023-04-11 (×9): qty 1

## 2023-04-11 MED ORDER — PANCRELIPASE (LIP-PROT-AMYL) 36000-114000 UNITS PO CPEP
36000.0000 [IU] | ORAL_CAPSULE | ORAL | Status: DC | PRN
  Administered 2023-04-11: 36000 [IU] via ORAL
  Filled 2023-04-11: qty 1

## 2023-04-11 MED ORDER — SODIUM CHLORIDE 0.9 % IV BOLUS
500.0000 mL | Freq: Once | INTRAVENOUS | Status: AC
Start: 2023-04-11 — End: 2023-04-11
  Administered 2023-04-11: 500 mL via INTRAVENOUS

## 2023-04-11 MED ORDER — DEXTROSE IN LACTATED RINGERS 5 % IV SOLN: INTRAVENOUS | Status: AC

## 2023-04-11 MED ORDER — SEVELAMER CARBONATE 800 MG PO TABS
800.0000 mg | ORAL_TABLET | ORAL | Status: DC | PRN
  Filled 2023-04-11: qty 1

## 2023-04-11 MED ORDER — HEPARIN BOLUS VIA INFUSION
2000.0000 [IU] | Freq: Once | INTRAVENOUS | Status: AC
  Administered 2023-04-11: 2000 [IU] via INTRAVENOUS
  Filled 2023-04-11: qty 2000

## 2023-04-11 MED ORDER — CHLORHEXIDINE GLUCONATE CLOTH 2 % EX PADS
6.0000 | MEDICATED_PAD | Freq: Every day | CUTANEOUS | Status: DC
  Administered 2023-04-12 – 2023-04-13 (×2): 6 via TOPICAL

## 2023-04-11 MED ORDER — HEPARIN BOLUS VIA INFUSION
1000.0000 [IU] | Freq: Once | INTRAVENOUS | Status: AC
  Administered 2023-04-11: 1000 [IU] via INTRAVENOUS
  Filled 2023-04-11: qty 1000

## 2023-04-11 MED ORDER — OSELTAMIVIR PHOSPHATE 30 MG PO CAPS
30.0000 mg | ORAL_CAPSULE | ORAL | Status: DC
  Administered 2023-04-13: 30 mg via ORAL
  Filled 2023-04-11: qty 1

## 2023-04-11 MED ORDER — HEPARIN SODIUM (PORCINE) 1000 UNIT/ML IJ SOLN
INTRAMUSCULAR | Status: AC
  Administered 2023-04-11: 1000 [IU]
  Filled 2023-04-11: qty 4

## 2023-04-11 MED ORDER — MIDODRINE HCL 5 MG PO TABS
10.0000 mg | ORAL_TABLET | Freq: Three times a day (TID) | ORAL | Status: DC
  Administered 2023-04-11 – 2023-04-15 (×12): 10 mg via ORAL
  Filled 2023-04-11 (×13): qty 2

## 2023-04-11 MED ORDER — AMIODARONE HCL 200 MG PO TABS
200.0000 mg | ORAL_TABLET | Freq: Every day | ORAL | Status: DC
  Administered 2023-04-12 – 2023-04-14 (×3): 200 mg via ORAL
  Filled 2023-04-11 (×3): qty 1

## 2023-04-11 MED ORDER — MIDODRINE HCL 5 MG PO TABS
10.0000 mg | ORAL_TABLET | Freq: Once | ORAL | Status: AC
  Administered 2023-04-11: 10 mg via ORAL
  Filled 2023-04-11: qty 2

## 2023-04-11 NOTE — ED Notes (Signed)
Patient noted to have two SBPs in the 70s. Dr. Joneen Roach made aware. Currently last BP 113/60 Also patient noted to desat to 80% while on 3L [place] -- o2 titrated to 6L [place] with improvement to 83%; patient placed on NRB @10L ; Dr. Joneen Roach made aware.  Patient remains lethargic but responsive to voice, ox4

## 2023-04-11 NOTE — Progress Notes (Signed)
   04/11/23 1830  Vitals  Temp 97.6 F (36.4 C)  Pulse Rate (!) 106  Resp 17  BP (!) 113/92  SpO2 100 %  O2 Device Bi-PAP  Oxygen Therapy  Patient Activity (if Appropriate) In bed  Pulse Oximetry Type Continuous   Received patient in bed to unit.  Alert and oriented, less lethargic than before HD.  Informed consent signed and in chart.   TX duration:3.0 hours  Patient tolerated well.  Transported back to the room  Alert, without acute distress on Bipap with Clinical research associate and RT.  Hand-off given to patient's nurse at bedside.  Access used: R SCVC Access issues: None  Total UF removed: Medication(s) given: See MAR    Laqueta Due, RN [facility]

## 2023-04-11 NOTE — ED Notes (Signed)
Per pt request: pt taken off bipap and back on [facility]

## 2023-04-11 NOTE — ED Notes (Signed)
 Pt off unit for MRI

## 2023-04-11 NOTE — ED Notes (Signed)
Lab and this nurse have attempted to collect pt PTT. This nurse reached out to the provider for an order to stick a lower extremity. No order provided at this time.

## 2023-04-11 NOTE — Progress Notes (Addendum)
Called by nursing, patient intermittently hypotensive down to 70s.  Extra dose of midodrine 10 mg x 1 given. Patient is also becoming hypoxic, placed on nonrebreather.  ABG, CXR ordered.  ABG result 7.2/74.8/29.  CPAP ordered.  CXR pending.  Admission CXR normal.  Repeat CXR looking for flash pulmonary edema in the face of MI and A-fib with RVR.

## 2023-04-11 NOTE — Progress Notes (Signed)
Spoke with Dr. Luan Pulling him aware that this pt is only registering a an occasional vbalid waveform for her o2 sensor---hot packs being used to attempt to warm the extremities--when it does register it shows 97 to 100% Pt is on BIPAP at 60% FIO2---9liters Pt is responsive as to her baseline at the onset of the tx--x4 for orientation Pts bp is soft on the high 90s---Albumin 25 grams ordered and given by the primary HD nurse IV x 1--- Stat ABG ordered and drawn by respiratory therapy---results pending at this time--

## 2023-04-11 NOTE — Progress Notes (Addendum)
PT Cancellation Note  Patient Details Name: Kim Burns MRN: [id number] DOB: 29-Jan-1936   Cancelled Treatment:    Reason Eval/Treat Not Completed: Patient at procedure or test/unavailable. Pt just returned from MRI. Pt back on bipap. Echo is in the room now. Pt also scheduled for [place] and HD today. PT to re-attempt eval tomorrow.   Ilda Foil 04/11/2023, 12:00 PM

## 2023-04-11 NOTE — Consult Note (Signed)
Renal Service Consult Note [facility]  Kim Burns 04/11/2023 [name], MD Requesting Physician: Dr. Marland Mcalpine  Reason for Consult: ESRD pt w/ SOB, recent flu HPI: The patient is a 88 y.o. year-old w/ PMH as below who presented to ED c/o SOB an wheezing. Recently admitted at OSH for flu, also recently completed 14d of po doxycycline for foot/ toe infection/ osteo. L leg has been more painful and swollen lately. In ED afeb, HR 90, BP 115/ 80, RR 19-24, SpO2 95-100 %. Na 139 K 3.7  alb 2.5  creat 3s. WBC 13K, LA 3.8 initially. Hb 11. CXR shows LLL retrocardiac density, no other infiltrates, edema or congestion. Pt was admitted for sepsis and started on IV cefepime and IV vanc. We are asked to see for dialysis.    Pt seen in room. Main c/o is L lower leg swelling, redness and pain. Is on RA this morning. Pt is on HD MWF, last HD WEd. On HD 2 yrs, l/w her son. Son drives her to dialysis. BP's are "low" most of the time, takes midodrine 10 tid at home.    PMH Anxiety Atrial fib ESRD on HD Gout HTN OSA H/o CVA   ROS - denies CP, no joint pain, no HA, no blurry vision, no rash, no diarrhea, no nausea/ vomiting, no dysuria, no difficulty voiding   Past Medical History  Past Medical History:  Diagnosis Date   Anemia of chronic disease    takes iron   Anxiety    Atrial fibrillation (HCC)    Blood transfusion without reported diagnosis    Bradycardia    Diabetes mellitus without complication (HCC)    became diabetic after Whipple procedure   Diarrhea    Dysrhythmia 04/16/2016   bradycardia due to medication    ESRD on hemodialysis [facility])    M-W-F   GERD (gastroesophageal reflux disease)    Gout    Headache    History of kidney stones 06/2013   Hyperparathyroidism (HCC)    Hypertension    PONV (postoperative nausea and vomiting)    Sleep apnea    no cpap machine. could not tolerate   Stroke (HCC) 04/16/2016   TIA 1995   Vitamin D deficiency     Past Surgical History  Past Surgical History:  Procedure Laterality Date   A/V FISTULAGRAM Left 08/30/2021   Procedure: A/V Fistulagram;  Surgeon: Cephus Shelling, MD;  Location: [facility] INVASIVE CV LAB;  Service: Cardiovascular;  Laterality: Left;   A/V FISTULAGRAM N/A 02/25/2023   Procedure: A/V Fistulagram;  Surgeon: Maeola Harman, MD;  Location: [facility] INVASIVE CV LAB;  Service: Vascular;  Laterality: N/A;   ABDOMINAL HYSTERECTOMY  1985   complete   BACK SURGERY  1980   lower   BASCILIC VEIN TRANSPOSITION Left 04/24/2017   Procedure: LEFT ARM FIRST STAGE BASILIC VEIN TRANSPOSITION;  Surgeon: Nada Libman, MD;  Location: [facility] OR;  Service: Vascular;  Laterality: Left;   BASCILIC VEIN TRANSPOSITION Left 07/10/2017   Procedure: SECOND STAGE BASILIC VEIN TRANSPOSITION LEFT ARM;  Surgeon: Nada Libman, MD;  Location: [facility] OR;  Service: Vascular;  Laterality: Left;   BREAST LUMPECTOMY Left x 2   many years apart, benign   CHOLECYSTECTOMY  1985   CYSTOSCOPY W/ URETERAL STENT PLACEMENT Left 10/31/2021   Procedure: CYSTOSCOPY WITH RETROGRADE PYELOGRAM/URETERAL STENT PLACEMENT;  Surgeon: Crist Fat, MD;  Location: [facility] OR;  Service: Urology;  Laterality: Left;   CYSTOSCOPY WITH URETEROSCOPY AND  STENT PLACEMENT Left 06/18/2013   Procedure: CYSTOSCOPY WITH Lef URETEROSCOPY AND Left STENT PLACEMENT;  Surgeon: Crecencio Mc, MD;  Location: [facility] ORS;  Service: Urology;  Laterality: Left;   DIALYSIS/PERMA CATHETER INSERTION N/A 03/13/2023   Procedure: DIALYSIS/PERMA CATHETER INSERTION;  Surgeon: Dagoberto Ligas, MD;  Location: [facility] INVASIVE CV LAB;  Service: Cardiovascular;  Laterality: N/A;   ESOPHAGOGASTRODUODENOSCOPY (EGD) WITH PROPOFOL N/A 11/13/2016   Procedure: ESOPHAGOGASTRODUODENOSCOPY (EGD) WITH PROPOFOL;  Surgeon: Kathi Der, MD;  Location: [facility] ENDOSCOPY;  Service: Gastroenterology;  Laterality: N/A;   EUS N/A 02/07/2016   Procedure: ESOPHAGEAL ENDOSCOPIC ULTRASOUND (EUS)  RADIAL;  Surgeon: Willis Modena, MD;  Location: [facility] ENDOSCOPY;  Service: Endoscopy;  Laterality: N/A;   EYE SURGERY Bilateral 2014   ioc for catracts    FOOT SURGERY Left 1990   something with toes unsure what    HERNIA REPAIR  2018   IR CATHETER TUBE CHANGE  03/14/2022   IR EMBO TUMOR ORGAN ISCHEMIA INFARCT INC GUIDE ROADMAPPING  08/22/2021   IR NEPHROSTOMY EXCHANGE LEFT  06/27/2022   IR NEPHROSTOMY EXCHANGE LEFT  08/26/2022   IR NEPHROSTOMY EXCHANGE LEFT  10/22/2022   IR NEPHROSTOMY EXCHANGE LEFT  12/16/2022   IR NEPHROSTOMY EXCHANGE LEFT  12/30/2022   IR NEPHROSTOMY EXCHANGE LEFT  02/10/2023   IR NEPHROSTOMY EXCHANGE LEFT  04/07/2023   IR NEPHROSTOMY PLACEMENT LEFT  03/14/2022   IR RADIOLOGIST EVAL & MGMT  05/18/2021   IR RADIOLOGIST EVAL & MGMT  10/01/2021   IR RADIOLOGIST EVAL & MGMT  11/12/2021   IR RADIOLOGIST EVAL & MGMT  11/22/2021   IR RADIOLOGIST EVAL & MGMT  11/27/2021   IR RADIOLOGIST EVAL & MGMT  12/20/2021   IR RENAL SUPRASEL UNI S&I MOD SED  08/22/2021   IR US GUIDE VASC ACCESS RIGHT  08/22/2021   LEFT HEART CATH AND CORONARY ANGIOGRAPHY N/A 08/27/2021   Procedure: LEFT HEART CATH AND CORONARY ANGIOGRAPHY;  Surgeon: Kathleene Hazel, MD;  Location: [facility] INVASIVE CV LAB;  Service: Cardiovascular;  Laterality: N/A;   PARATHYROID EXPLORATION     PERIPHERAL VASCULAR BALLOON ANGIOPLASTY Left 08/30/2021   Procedure: PERIPHERAL VASCULAR BALLOON ANGIOPLASTY;  Surgeon: Cephus Shelling, MD;  Location: [facility] INVASIVE CV LAB;  Service: Cardiovascular;  Laterality: Left;  arm fistula   PERIPHERAL VASCULAR BALLOON ANGIOPLASTY Left 02/25/2023   Procedure: PERIPHERAL VASCULAR BALLOON ANGIOPLASTY;  Surgeon: Maeola Harman, MD;  Location: [facility] INVASIVE CV LAB;  Service: Vascular;  Laterality: Left;  left AVF   RADIOLOGY WITH ANESTHESIA Left 08/22/2021   Procedure: MICROWAVE ABLATION;  Surgeon: Simonne Come, MD;  Location: [facility] ORS;  Service: Anesthesiology;  Laterality: Left;    WHIPPLE PROCEDURE N/A 04/23/2016   Procedure: WHIPPLE PROCEDURE;  Surgeon: Almond Lint, MD;  Location: [facility] OR;  Service: General;  Laterality: N/A;   Family History  Family History  Problem Relation Age of Onset   Heart disease Mother    Stroke Mother    Cancer Father        colon   Alzheimer's disease Sister    Alzheimer's disease Sister    Cancer Brother        brain   Breast cancer Neg Hx    Social History  reports that she has never smoked. She has never used smokeless tobacco. She reports that she does not drink alcohol and does not use drugs. Allergies  Allergies  Allergen Reactions   Lopid [Gemfibrozil] Other (See Comments)    Myalgia    Lotemax [Loteprednol  Etabonate] Rash    Skin burning  Blurry vision   Statins Other (See Comments)    Rosuvastatin Muscle weakness   Norco [Hydrocodone-Acetaminophen] Other (See Comments)    Headaches  Tolerates acetaminophen    Other Diarrhea    Real Butter - diarrhea; patient still uses butter   Vibra-Tab [Doxycycline] Other (See Comments)    Unknown reaction   Amlodipine Other (See Comments)    Norvasc   Codeine Other (See Comments)    headache   Hydrocodone Other (See Comments)   Hydrocodone-Acetaminophen Other (See Comments)    Headache. Tolerates acetaminophen.   Lisinopril Other (See Comments)   Verapamil Other (See Comments)   Home medications Prior to Admission medications   Medication Sig Start Date End Date Taking? Authorizing Provider  acetaminophen (TYLENOL) 500 MG tablet Take 1,000 mg by mouth every 6 (six) hours as needed for mild pain (pain score 1-3) or headache. 01/02/23  Yes [provider]  amiodarone (PACERONE) 200 MG tablet Take 1 tablet (200 mg total) by mouth daily. 11/15/22  Yes Love, Evlyn Kanner, PA-C  aspirin 81 MG chewable tablet Chew 1 tablet (81 mg total) by mouth daily. Patient taking differently: Chew 81 mg by mouth daily. Take 81mg  by mouth every morning except on dialysis days, hold  in the morning and take after dialysis. 11/13/22  Yes Love, Evlyn Kanner, PA-C  B Complex-C-Zn-Folic Acid (DIALYVITE 800 WITH ZINC) 0.8 MG TABS Take 1 tablet by mouth daily.   Yes [provider]  benzonatate (TESSALON) 200 MG capsule Take 200 mg by mouth 3 (three) times daily as needed. 04/08/23  Yes [provider]  dextromethorphan-guaiFENesin (MUCINEX DM) 30-600 MG 12hr tablet Take 1 tablet by mouth 2 (two) times daily as needed for cough.   Yes [provider]  diphenhydramine-acetaminophen (TYLENOL PM) 25-500 MG TABS tablet Take 2 tablets by mouth at bedtime.   Yes [provider]  doxycycline (VIBRA-TABS) 100 MG tablet Take 1 tablet (100 mg total) by mouth 2 (two) times daily for 14 days. 03/29/23 04/12/23 Yes Pokhrel, Laxman, MD  hydrALAZINE (APRESOLINE) 50 MG tablet Take 50 mg by mouth See admin instructions.   Yes [provider]  insulin aspart (NOVOLOG FLEXPEN) 100 UNIT/ML FlexPen Inject 5-7 Units into the skin 3 (three) times daily with meals. Sliding scale.   Yes [provider]  Lifitegrast 5 % SOLN Administer 1 drop into both eyes in the morning and 1 drop before bedtime. 03/10/23  Yes [provider]  lipase/protease/amylase (CREON) 36000 UNITS CPEP capsule Take 36,000 Units by mouth See admin instructions. Take 36,000 units (1 capsule) by mouth with meals and snacks.   Yes [provider]  meclizine (ANTIVERT) 25 MG tablet Take 1 tablet (25 mg total) by mouth 3 (three) times daily as needed for dizziness. 01/02/23 01/02/24 Yes Gillis Santa, MD  metoprolol succinate (TOPROL-XL) 50 MG 24 hr tablet Take 1 tablet (50 mg total) by mouth 4 (four) times a week. Take on nondialysis days, Tuesday, Thursday, Saturday and Sunday if systolic BP greater than 130 mmHg Patient taking differently: Take 50 mg by mouth See admin instructions. Take up to 4 times weekly depending on BP reading.  Take on nondialysis days, Tuesday, Thursday,  Saturday and Sunday if systolic BP greater than 130 mmHg 04/10/23  Yes [place], Peter M, MD  midodrine (PROAMATINE) 10 MG tablet Take 1 tablet (10 mg total) by mouth See admin instructions. Take 10mg  by mouth before dialysis. Bring with you  to dialysis in case another dose is required. May take 10 mg 3 times a day if systolic BP less than 120 mmHg 01/02/23 01/02/24 Yes Gillis Santa, MD  pantoprazole (PROTONIX) 40 MG tablet Take by mouth. 04/05/23 05/05/23 Yes [provider]  Polyethyl Glycol-Propyl Glycol (SYSTANE HYDRATION PF OP) Place 1 drop into both eyes 3 (three) times daily.   Yes [provider]  rOPINIRole (REQUIP) 0.25 MG tablet Take 1 tablet (0.25 mg total) by mouth 2 (two) times daily. Patient taking differently: Take 0.75 mg by mouth at bedtime. 09/24/22  Yes Rodolph Bong, MD  sevelamer carbonate (RENVELA) 800 MG tablet Take 800 mg by mouth 3 (three) times daily with meals. 03/21/23  Yes [provider]  White Petrolatum-Mineral Oil (SYSTANE NIGHTTIME) OINT Apply 1 Application to eye at bedtime as needed (severe dry eyes).   Yes [provider]  lidocaine-prilocaine (EMLA) cream Apply 1 Application topically every Monday, Wednesday, and Friday with hemodialysis. Patient not taking: Reported on 04/11/2023    [provider]  olopatadine (PATADAY) 0.1 % ophthalmic solution Place 1 drop into both eyes 2 (two) times daily as needed for allergies. Patient not taking: Reported on 04/11/2023    [provider]      Exam Gen alert, no distress, chronically ill appearing No rash, cyanosis or gangrene Sclera anicteric, throat clear  No jvd or bruits Chest fine rales/ wheezing R base, L clear RRR no MRG Abd soft ntnd no mass or ascites +bs GU defer MS no joint effusions or deformity Ext 1+ bilat PT edema, no other edema Neuro is alert, Ox 3 , nf    RIJ TDC / LUA AVF+bruit       Renal-related home meds:  - renvela 800 ac tid -  dailyvite - hydralazine 50mg  - metoprolol xl 50mg  prn  - midodrine 10 tid    OP HD: [facility] MWF 3.5h   B350   50.5kg   2K bath   TDC+  AVF (using TDC) Heparin none - last OP HD 2/19, post wt 49.6kg  - getting to dry wt, sometimes under, very low IDWG's - hectorol 3 mcg - venofer 50 weekly - mircera 225 mcg q2, last 2/17, due 3/03    Assessment/ Plan: Sepsis - probable LLE cellulitis, started on IV abx w/ cefepime and vanc. Poss pna  Acute hypoxic / hypercarbic resp failure - recent flu A, LLL opacity on CXR. Seen by pulm for consult. Sending RVP/ covid / flu, blood cx's. Prn bipap. IV abx for poss pna ESRD - on HD MWF. Last HD 2/19. HD today.  BP - on midodrine 10 tid at home, continue here. BP's low here 100- 110s systolic.  Volume - no vol overload, may be losing body wt. Mild calf edema. UF 1-2 L as tol Anemia of esrd - Hb 10- 12 here, no esa needs.  Secondary hyperparathyroidism - CCa in range, phos a bit high. Cont binder w/ meals and IV vdra.  RCC - s/p ablation c/b urine leak s/p neph tube      Vinson Moselle  MD [facility] 04/11/2023, 11:35 AM  Recent Labs  Lab 04/10/23 1826 04/10/23 2035 04/11/23 0515 04/11/23 0607 04/11/23 1002  HGB 11.0*   < > 10.8* 12.9 12.9  ALBUMIN 2.5*  --  2.3*  --   --   CALCIUM 8.1*  --  8.1*  --   --   PHOS  --   --  7.6*  --   --  CREATININE 3.83*  --  3.88*  --   --   K 3.7   < > 4.1 4.0 4.1   < > = values in this interval not displayed.   Inpatient medications:  amiodarone  200 mg Oral Daily   Chlorhexidine Gluconate Cloth  6 each Topical Q0600   doxercalciferol  3 mcg Intravenous Q M,W,F-HD   heparin  1,000 Units Intravenous Once   insulin aspart  0-6 Units Subcutaneous TID WC   lipase/protease/amylase  36,000 Units Oral TID WC   midodrine  10 mg Oral TID WC   multivitamin  1 tablet Oral QHS   sevelamer carbonate  800 mg Oral TID WC   sevelamer carbonate  800 mg Oral TID WC    ceFEPime (MAXIPIME) IV Stopped (04/10/23 2317)   heparin  900 Units/hr (04/11/23 1011)   [START ON 04/12/2023] vancomycin     acetaminophen **OR** acetaminophen, lipase/protease/amylase, melatonin, ondansetron (ZOFRAN) IV, sevelamer carbonate

## 2023-04-11 NOTE — Progress Notes (Signed)
   04/11/23 0306  BiPAP/CPAP/SIPAP  $ Non-Invasive Ventilator  Non-Invasive Vent Initial  $ Face Mask Small Yes  BiPAP/CPAP/SIPAP Pt Type Adult  BiPAP/CPAP/SIPAP SERVO  Mask Type Full face mask  Mask Size Small  Set Rate 18 breaths/min  Respiratory Rate 23 breaths/min  IPAP 14 cmH20  EPAP 5 cmH2O  PEEP 5 cmH20  FiO2 (%) 40 %  Minute Ventilation 8.6  Leak 43  Peak Inspiratory Pressure (PIP) 16  Tidal Volume (Vt) 334  Patient Home Equipment No  BiPAP/CPAP /SiPAP Vitals  BP 115/76  Bilateral Breath Sounds Clear;Diminished  MEWS Score/Color  MEWS Score 1  MEWS Score Color Green

## 2023-04-11 NOTE — ED Notes (Signed)
Labs need to be re drawn per cal from lab.

## 2023-04-11 NOTE — ED Notes (Addendum)
Pt returned to room bipap replaced, echo now at bedside

## 2023-04-11 NOTE — Progress Notes (Signed)
PHARMACY - ANTICOAGULATION CONSULT NOTE  Pharmacy Consult for heparin infusion Indication: atrial fibrillation  Allergies  Allergen Reactions   Lopid [Gemfibrozil] Other (See Comments)    Myalgia    Lotemax [Loteprednol Etabonate] Rash    Skin burning  Blurry vision   Statins Other (See Comments)    Rosuvastatin Muscle weakness   Norco [Hydrocodone-Acetaminophen] Other (See Comments)    Headaches  Tolerates acetaminophen    Other Diarrhea    Real Butter - diarrhea; patient still uses butter   Vibra-Tab [Doxycycline] Other (See Comments)    Unknown reaction   Amlodipine Other (See Comments)    Norvasc   Codeine Other (See Comments)    headache   Hydrocodone Other (See Comments)   Hydrocodone-Acetaminophen Other (See Comments)    Headache. Tolerates acetaminophen.   Lisinopril Other (See Comments)   Verapamil Other (See Comments)    Patient Measurements:   Heparin Dosing Weight: 51 kg  Vital Signs: Temp: 97.6 F (36.4 C) (02/21 1830) Temp Source: Axillary (02/21 1830) BP: 113/92 (02/21 1830) Pulse Rate: 106 (02/21 1830)  Labs: Recent Labs    04/10/23 1826 04/10/23 2017 04/10/23 2035 04/11/23 0037 04/11/23 0251 04/11/23 0515 04/11/23 0607 04/11/23 0902 04/11/23 1002 04/11/23 1800  HGB 11.0*  --    < >  --    < > 10.8* 12.9  --  12.9  --   HCT 37.6  --    < >  --    < > 38.2 38.0  --  38.0  --   PLT 198  --   --   --   --  200  --   --   --   --   HEPARINUNFRC  --   --   --   --   --   --   --  <0.10*  --  0.11*  CREATININE 3.83*  --   --   --   --  3.88*  --   --   --   --   TROPONINIHS  --  240*  --  223*  --  173*  --   --   --   --    < > = values in this interval not displayed.    CrCl cannot be calculated (Unknown ideal weight.).   Medical History: Past Medical History:  Diagnosis Date   Anemia of chronic disease    takes iron   Anxiety    Atrial fibrillation (HCC)    Blood transfusion without reported diagnosis    Bradycardia     Diabetes mellitus without complication (HCC)    became diabetic after Whipple procedure   Diarrhea    Dysrhythmia 04/16/2016   bradycardia due to medication    ESRD on hemodialysis [facility])    M-W-F   GERD (gastroesophageal reflux disease)    Gout    Headache    History of kidney stones 06/2013   Hyperparathyroidism (HCC)    Hypertension    PONV (postoperative nausea and vomiting)    Sleep apnea    no cpap machine. could not tolerate   Stroke (HCC) 04/16/2016   TIA 1995   Vitamin D deficiency     Medications:  Scheduled:   amiodarone  200 mg Oral Daily   Chlorhexidine Gluconate Cloth  6 each Topical Q0600   doxercalciferol  3 mcg Intravenous Q M,W,F-HD   heparin  2,000 Units Intravenous Once   heparin sodium (porcine)       insulin aspart  0-6 Units Subcutaneous TID WC   lipase/protease/amylase  36,000 Units Oral TID WC   midodrine  10 mg Oral TID WC   multivitamin  1 tablet Oral QHS   sevelamer carbonate  800 mg Oral TID WC   Infusions:   albumin human 25 g (04/11/23 1627)   ceFEPime (MAXIPIME) IV Stopped (04/10/23 2317)   heparin 900 Units/hr (04/11/23 1011)   [START ON 04/12/2023] vancomycin      Assessment: 88 yo F presenting with general malaise. Has been sick for a few weeks. Recently Flu positive. PMH significant for Afib, pancreatic neoplasm s/p Whipple, ESRD on HD MWF. Not on anticoagulation PTA due to risk of falls, CHADSVASC 9. Pharmacy consulted for heparin management.   Heparin level came back low at 0.11. Lab collected in HD. We will rebolus and increase rate.   Goal of Therapy:  Heparin level 0.3-0.7 units/ml Monitor platelets by anticoagulation protocol: Yes   Plan:  Bolus heparin 2000 units Increase heparin to 1050 units/hr Check AM heparin level Monitor daily heparin level, CBC, and signs/symptoms of bleeding  Ulyses Southward, PharmD, BCIDP, AAHIVP, CPP Infectious Disease Pharmacist 04/11/2023 7:34 PM

## 2023-04-11 NOTE — Progress Notes (Signed)
Pt's ABG obtained and sent to lab. Right hand fingers and nails noted to be blue although SATs are maintaining high 90s. FIO2 increased from 60%-70% on bipap as precautionary. Minute ventilation also increased from 6.9Lpm to 7.4Lpm. Pt is responsive to name at this time.

## 2023-04-11 NOTE — Progress Notes (Addendum)
PHARMACY - ANTICOAGULATION CONSULT NOTE  Pharmacy Consult for heparin infusion Indication: atrial fibrillation  Allergies  Allergen Reactions   Lopid [Gemfibrozil] Other (See Comments)    Myalgia    Lotemax [Loteprednol Etabonate] Rash    Skin burning  Blurry vision   Statins Other (See Comments)    Rosuvastatin Muscle weakness   Norco [Hydrocodone-Acetaminophen] Other (See Comments)    Headaches  Tolerates acetaminophen    Other Diarrhea    Real Butter - diarrhea; patient still uses butter   Vibra-Tab [Doxycycline] Other (See Comments)    Unknown reaction   Amlodipine Other (See Comments)    Norvasc   Codeine Other (See Comments)    headache   Hydrocodone Other (See Comments)   Hydrocodone-Acetaminophen Other (See Comments)    Headache. Tolerates acetaminophen.   Lisinopril Other (See Comments)   Verapamil Other (See Comments)    Patient Measurements:   Heparin Dosing Weight: 51 kg  Vital Signs: Temp: 97.7 F (36.5 C) (02/21 0945) Temp Source: Oral (02/21 0945) BP: 70/52 (02/21 0945) Pulse Rate: 89 (02/21 0945)  Labs: Recent Labs    04/10/23 1826 04/10/23 2017 04/10/23 2035 04/11/23 0037 04/11/23 0251 04/11/23 0515 04/11/23 0607 04/11/23 0902  HGB 11.0*  --    < >  --  12.6 10.8* 12.9  --   HCT 37.6  --    < >  --  37.0 38.2 38.0  --   PLT 198  --   --   --   --  200  --   --   HEPARINUNFRC  --   --   --   --   --   --   --  <0.10*  CREATININE 3.83*  --   --   --   --  3.88*  --   --   TROPONINIHS  --  240*  --  223*  --  173*  --   --    < > = values in this interval not displayed.    Estimated Creatinine Clearance: 8.5 mL/min (A) (by C-G formula based on SCr of 3.88 mg/dL (H)).   Medical History: Past Medical History:  Diagnosis Date   Anemia of chronic disease    takes iron   Anxiety    Atrial fibrillation (HCC)    Blood transfusion without reported diagnosis    Bradycardia    Diabetes mellitus without complication (HCC)    became  diabetic after Whipple procedure   Diarrhea    Dysrhythmia 04/16/2016   bradycardia due to medication    ESRD on hemodialysis [facility])    M-W-F   GERD (gastroesophageal reflux disease)    Gout    Headache    History of kidney stones 06/2013   Hyperparathyroidism (HCC)    Hypertension    PONV (postoperative nausea and vomiting)    Sleep apnea    no cpap machine. could not tolerate   Stroke (HCC) 04/16/2016   TIA 1995   Vitamin D deficiency     Medications:  Scheduled:   amiodarone  200 mg Oral Daily   insulin aspart  0-6 Units Subcutaneous TID WC   lipase/protease/amylase  36,000 Units Oral TID WC   midodrine  10 mg Oral TID WC   sevelamer carbonate  800 mg Oral TID WC   Infusions:   ceFEPime (MAXIPIME) IV Stopped (04/10/23 2317)   heparin 750 Units/hr (04/10/23 2319)   sodium chloride     [START ON 04/12/2023] vancomycin  Assessment: 88 yo F presenting with general malaise. Has been sick for a few weeks. Recently Flu positive. PMH significant for Afib, pancreatic neoplasm s/p Whipple, ESRD on HD MWF. Not on anticoagulation PTA due to risk of falls, CHADSVASC 9. Pharmacy consulted for heparin management.   Initial heparin level undetectable on 750 units/hr. No issues with infusion or overt s/sx of bleeding per RN. Will give small bolus and increase ~3 units/kg/hr. FYI the patient is a very hard stick and it took multiple attempts to get enough blood for labs.  Goal of Therapy:  Heparin level 0.3-0.7 units/ml Monitor platelets by anticoagulation protocol: Yes   Plan:  Bolus heparin 1000 units Increase heparin to 900 units/hr Check 8 hour heparin level Monitor daily heparin level, CBC, and signs/symptoms of bleeding  Ruben Im, PharmD Clinical Pharmacist 04/11/2023 9:59 AM Please check AMION for all [facility] Pharmacy numbers

## 2023-04-11 NOTE — ED Notes (Addendum)
RRT placing BiPAP at this time

## 2023-04-11 NOTE — Progress Notes (Signed)
PROGRESS NOTE    SARABELLA CAPRIO  GNF:[id number] DOB: 01/14/1936 DOA: 04/10/2023 PCP: Lewis Moccasin, MD   Brief Narrative:  Patient is an 88 year old chronically ill-appearing Caucasian female with a past medical history significant for end-stage renal disease on hemodialysis Monday Wednesday Friday, and intraductal papillary mucinous neoplasm of the pancreas status post Whipple procedure in 2018, persistent atrial fibrillation not on anticoagulation, hypertension, diabetes mellitus type 2, chronic diastolic CHF, anemia chronic disease baseline hemoglobin ranging from 7-12 as well as other comorbidities who presented with generalized weakness presenting from home.  She was recently hospitalized and discharged on February 2025 for acute osteomyelitis involving the second left toe.  Dr. Tommie Ard at that time was consulted and recommended conservative measures and patient was given a course of oral doxycycline that was set to finish on 04/12/2023.  Subsequently after hospitalization she was hospitalized at another outside facility for 10 days for influenza infection.  She was discharged from the hospital 4 days ago but then presented to [facility] yesterday evening due to generalized weakness since the time of the hospitalization associated without any focal deficits.  She had noted decreased oral intake and had some shortness of breath from her setting of previous flu infection.  Son conveyed that she remain compliant with the doxycycline but he thought the left foot had slightly worsened over the course of this 12 days and patient became more confused.  She was worked up and brought into the ED and was found to be hypotensive and decompensated and had to be placed on supplemental oxygen and BiPAP.  He was weaned off of BiPAP to high flow nasal cannula.  She was admitted for severe sepsis likely due to acute osteomyelitis so we will repeat an MRI of the foot.  Respiratory status is still tenuous so we will  continue supplemental oxygen.  Critical care was consulted and the patient stabilized and so they recommended the patient could remain on hospitalist service.  Assessment and Plan:  Severe sepsis secondary to osteomyelitis likely of the left second toe with left lower extremity cellulitis complicated by recent influenza infection -She met sepsis criteria on admission with an elevated lactate, tachycardia, tachypnea on oxygen requirement  -Given IV fluid and had to be given 2 500 mL boluses given her end-stage renal disease -Was taking doxycycline -Check blood cultures and respiratory virus panel -Procalcitonin level is less than 0.10 -Continue with broad-spectrum antibiotics -WBC Trend: Recent Labs  Lab 03/26/23 1519 03/27/23 0622 03/28/23 0600 04/10/23 1826 04/11/23 0515  WBC 11.1* 10.6* 10.2 13.3* 14.6*  -Lactic acid level was done and showed: Recent Labs  Lab 03/26/23 1519 03/26/23 1803 04/10/23 1830 04/10/23 2035 04/11/23 0515  LATICACIDVEN 2.5* 1.8 3.1* 3.8* 2.1*  -Repeat for MRI of the foot and showed "Progressive abnormal edema distally in the proximal phalanx second toe compatible with acute osteomyelitis. Accentuated T2 signal in the fused proximal and middle phalanges of the small toe suggesting osteomyelitis. Low-level marrow edema in the tuft of the distal phalanx third toe potentially from osteomyelitis. Postoperative findings in the first metatarsal with medial erosions along the first metatarsal head and nonspecific marrow edema dorsally along the first metatarsal head and dorsally along the base of the proximal phalanx great toe similar to the previous exam. Appearance favors arthropathy with infection less likely given the lack of a joint effusion. Underlying gout is a distinct possibility given the medial erosions. Flattened/collapse head of the third metatarsal with arthropathy at the third MTP joint. Worsening  dorsal subcutaneous edema in the forefoot, cellulitis is  not excluded. Diffuse subcutaneous edema along the ball of the foot. Regional muscular edema likely neuropathic." -Will check an ABI of the lower extremities given that is difficult to palpate her pulse on the left foot and her foot was cold -Will need to discuss with Orthopedic Surgery message to Dr. Lajoyce Corners and he states that he will see the patient on Saturday morning  Acute encephalopathy in setting of sepsis -Continue treatment as above and continue with delirium precautions  Acute respiratory failure with hypercarbia and hypoxia Patient influenza A pneumonia and left lower lobe opacity -BiPAP as needed as she appears volume overloaded as well -ABG    Component Value Date/Time   PHART 7.26 (L) 04/11/2023 1634   PCO2ART 67 (HH) 04/11/2023 1634   PO2ART 127 (H) 04/11/2023 1634   HCO3 30.1 (H) 04/11/2023 1634   TCO2 31 04/11/2023 1002   ACIDBASEDEF 7.0 (H) 01/22/2022 1257   O2SAT 98.7 04/11/2023 1634  SpO2: 100 % O2 Flow Rate (L/min): 6 L/min FiO2 (%): 60 % -Continuous pulse oximetry maintain O2 saturations.  90% -Repeat chest x-ray in a.m. and continue supplemental oxygen via nasal cannula wean O2 as tolerated -PCCM consulted and agreed and patient underwent NIV PPV and dialysis which improved her condition  ESRD on hemodialysis Monday Wednesday Friday -Nephrology consulted for further evaluation and patient to be dialyzed this afternoon -BUN/Cr Trend: Recent Labs  Lab 03/26/23 1519 03/27/23 0622 03/28/23 0600 04/10/23 1826 04/11/23 0515  BUN 19 27* 45* 34* 35*  CREATININE 2.15* 2.71* 4.05* 3.83* 3.88*  -Judicious use of fluids given her ESRD -Avoid Nephrotoxic Medications, Contrast Dyes, Hypotension and Dehydration to Ensure Adequate Renal Perfusion and will need to Renally Adjust Meds -Continue to Monitor and Trend Renal Function carefully and repeat CMP in the AM   Volume overload in the setting of acute on chronic diastolic CHF Elevated troponin History of  nonobstructive CAD Proximal atrial fibrillation but not on chronic anticoagulation given her history of significant bleeding -BNP was 2195.1 on admission and repeat was 2179.6 and repeat an echo which showed an EF of 55 to 60% with indeterminate diastolic parameters and decreased right ventricular systolic function and severe pulmonary arterial hypertension -Her CHA2DS2-VASc score is 5 but not on chronic anticoagulation given history of GI bleed in September 2023 so just on prophylactic baby aspirin. -Holding metoprolol succinate for now given her hypotension on admission -Volume maintenance per dialysis -Cards resident evaluated and recommended to continue holding beta-blocker and amiodarone.  Continue with heparin drip for now  History of intraductal papillary mucinous neoplasm of the pancreas status post Whipple -With Creon with meals once diet is advanced  Diabetes mellitus type 2 -Her most recent hemoglobin A1c was 5.4% -Take sliding scale insulin 2 units 3 times daily with meals at home -Continue monitor blood sugars per protocol and she with low-dose sliding scale Recent Labs  Lab 03/28/23 0617 03/28/23 1202 03/28/23 1724 03/28/23 2033 03/29/23 0554 04/11/23 0813 04/11/23 1226  GLUCAP 224* 146* 197* 127* 212* 74 79   Normocytic Anemia/Anemia of Chronic Kidney Disease -Hgb/Hct Trend and likley hemoconcentrated on admission: Recent Labs  Lab 03/28/23 0600 04/10/23 1826 04/10/23 2035 04/11/23 0251 04/11/23 0515 04/11/23 0607 04/11/23 1002  HGB 8.0* 11.0* 14.3 12.6 10.8* 12.9 12.9  HCT 26.7* 37.6 42.0 37.0 38.2 38.0 38.0  MCV 96.0 97.2  --   --  100.5*  --   --   -Continue monitor for signs and symptoms  bleeding now that she is on anticoagulation -Repeat CBC in a.m.  Hypoalbuminemia -Patient's Albumin Trend: Recent Labs  Lab 03/26/23 1519 03/27/23 0622 04/10/23 1826 04/11/23 0515  ALBUMIN 2.7* 2.1* 2.5* 2.3*  -Continue to Monitor and Trend and repeat CMP in the  AM   DVT prophylaxis: heparin bolus via infusion 2,000 Units Start: 04/11/23 2030 SCDs Start: 04/10/23 2255    Code Status: Full Code Family Communication: Discussed with the son this morning over the telephone  Disposition Plan:  Level of care: Progressive Status is: Inpatient Remains inpatient appropriate because: His further clinical improvement and clearance   Consultants:  Nephrology PCCM Discussed with orthopedic surgery and they will see the patient tomorrow  Procedures:  As delineated as above  ECHOCARDIOGRAM IMPRESSIONS     1. Left ventricular ejection fraction, by estimation, is 55 to 60%. The  left ventricle has normal function. The left ventricle has no regional  wall motion abnormalities. Left ventricular diastolic parameters are  indeterminate. There is the  interventricular septum is flattened in systole and diastole, consistent  with right ventricular pressure and volume overload.   2. Right ventricular systolic function is moderately reduced. The right  ventricular size is mildly enlarged. There is severely elevated pulmonary  artery systolic pressure.   3. Left atrial size was moderately dilated.   4. Right atrial size was mildly dilated.   5. The mitral valve is normal in structure. Mild mitral valve  regurgitation. No evidence of mitral stenosis. Severe mitral annular  calcification.   6. Tricuspid valve regurgitation is moderate.   7. The aortic valve is tricuspid. Aortic valve regurgitation is not  visualized. Aortic valve sclerosis is present, with no evidence of aortic  valve stenosis.   8. The inferior vena cava is dilated in size with <50% respiratory  variability, suggesting right atrial pressure of 15 mmHg.   FINDINGS   Left Ventricle: Left ventricular ejection fraction, by estimation, is 55  to 60%. The left ventricle has normal function. The left ventricle has no  regional wall motion abnormalities. Strain imaging was not performed. The   left ventricular internal cavity   size was normal in size. There is no left ventricular hypertrophy. The  interventricular septum is flattened in systole and diastole, consistent  with right ventricular pressure and volume overload. Left ventricular  diastolic parameters are indeterminate.   Right Ventricle: The right ventricular size is mildly enlarged. Right  ventricular systolic function is moderately reduced. There is severely  elevated pulmonary artery systolic pressure. The tricuspid regurgitant  velocity is 3.44 m/s, and with an assumed  right atrial pressure of 15 mmHg, the estimated right ventricular systolic  pressure is 62.3 mmHg.   Left Atrium: Left atrial size was moderately dilated.   Right Atrium: Right atrial size was mildly dilated.   Pericardium: There is no evidence of pericardial effusion.   Mitral Valve: The mitral valve is normal in structure. Severe mitral  annular calcification. Mild mitral valve regurgitation. No evidence of  mitral valve stenosis. MV peak gradient, 5.5 mmHg. The mean mitral valve  gradient is 1.0 mmHg.   Tricuspid Valve: The tricuspid valve is normal in structure. Tricuspid  valve regurgitation is moderate . No evidence of tricuspid stenosis.   Aortic Valve: The aortic valve is tricuspid. Aortic valve regurgitation is  not visualized. Aortic valve sclerosis is present, with no evidence of  aortic valve stenosis.   Pulmonic Valve: The pulmonic valve was normal in structure. Pulmonic valve  regurgitation is  trivial. No evidence of pulmonic stenosis.   Aorta: The aortic root is normal in size and structure.   Venous: The inferior vena cava is dilated in size with less than 50%  respiratory variability, suggesting right atrial pressure of 15 mmHg.   IAS/Shunts: No atrial level shunt detected by color flow Doppler.   Additional Comments: 3D imaging was not performed.    LEFT VENTRICLE  PLAX 2D  LVOT diam:     1.90 cm  LV SV:          32  LV SV Index:   21  LVOT Area:     2.84 cm    LV Volumes (MOD)  LV vol d, MOD A2C: 60.2 ml  LV vol d, MOD A4C: 50.8 ml  LV vol s, MOD A2C: 30.6 ml  LV vol s, MOD A4C: 23.8 ml  LV SV MOD A2C:     29.6 ml  LV SV MOD A4C:     50.8 ml  LV SV MOD BP:      28.9 ml   RIGHT VENTRICLE          IVC  RV Basal diam:  3.40 cm  IVC diam: 2.40 cm  RV Mid diam:    2.60 cm  TAPSE (M-mode): 0.5 cm   RIGHT ATRIUM           Index  RA Area:     18.80 cm  RA Volume:   51.20 ml  33.82 ml/m   AORTIC VALVE  LVOT Vmax:   63.90 cm/s  LVOT Vmean:  46.700 cm/s  LVOT VTI:    0.113 m   MITRAL VALVE             TRICUSPID VALVE  MV Area VTI:  1.32 cm   TR Peak grad:   47.3 mmHg  MV Peak grad: 5.5 mmHg   TR Vmax:        344.00 cm/s  MV Mean grad: 1.0 mmHg  MV Vmax:      1.17 m/s   SHUNTS  MV Vmean:     49.1 cm/s  Systemic VTI:  0.11 m                           Systemic Diam: 1.90 cm   Checking ABIs and this is pending  Antimicrobials:  Anti-infectives (From admission, onward)    Start     Dose/Rate Route Frequency Ordered Stop   04/12/23 2200  vancomycin (VANCOREADY) IVPB 500 mg/100 mL        500 mg 100 mL/hr over 60 Minutes Intravenous Every 48 hours 04/10/23 2107     04/10/23 2130  ceFEPIme (MAXIPIME) 1 g in sodium chloride 0.9 % 100 mL IVPB        1 g 200 mL/hr over 30 Minutes Intravenous Every 24 hours 04/10/23 2107     04/10/23 2045  vancomycin (VANCOCIN) IVPB 1000 mg/200 mL premix        1,000 mg 200 mL/hr over 60 Minutes Intravenous  Once 04/10/23 2039 04/10/23 2243       Subjective: Seen and examined at bedside and the patient was extremely confused and encephalopathic and somnolent.  Had pain on palpation to her lower extremities but would not wake up very much.  Objective: Vitals:   04/11/23 1700 04/11/23 1730 04/11/23 1800 04/11/23 1830  BP: 100/67 (!) 108/51 91/73 (!) 113/92  Pulse: 100 (!) 108 (!) 108 (!) 106  Resp: 14 15 15 17   Temp:    97.6 F (36.4 C)  TempSrc:     Axillary  SpO2: 100% 100% 100% 100%    Intake/Output Summary (Last 24 hours) at 04/11/2023 1940 Last data filed at 04/10/2023 2317 Gross per 24 hour  Intake 800 ml  Output --  Net 800 ml   There were no vitals filed for this visit.  Examination: Physical Exam:  Constitutional: Elderly chronically ill-appearing Caucasian female who is encephalopathic and looks acutely ill Respiratory: Diminished to auscultation bilaterally with some coarse breath sounds and has some crackles but no appreciable rhonchi or rales., Normal respiratory effort and patient is not tachypenic. No accessory muscle use.  Wearing supplemental oxygen via nasal cannula at least 6 L Cardiovascular: RRR, no murmurs / rubs / gallops. S1 and S2 auscultated.  1+ lower extremity pitting edema bilaterally worse on the left compared to right Abdomen: Soft, non-tender, non-distended.. Bowel sounds positive.  GU: Deferred. Musculoskeletal: Pain on palpation of the lower extremities Skin: Left foot is cold and swollen and erythematous compared to the right and there is an ulcer noted on her left second toe and an ulcer starting to form on the right feet as well Neurologic: Somnolent and drowsy and wakes up to pain Psychiatric: Impaired judgment and insight.  Not awake and alert  Data Reviewed: I have personally reviewed following labs and imaging studies  CBC: Recent Labs  Lab 04/10/23 1826 04/10/23 2035 04/11/23 0251 04/11/23 0515 04/11/23 0607 04/11/23 1002  WBC 13.3*  --   --  14.6*  --   --   NEUTROABS 11.8*  --   --   --   --   --   HGB 11.0* 14.3 12.6 10.8* 12.9 12.9  HCT 37.6 42.0 37.0 38.2 38.0 38.0  MCV 97.2  --   --  100.5*  --   --   PLT 198  --   --  200  --   --    Basic Metabolic Panel: Recent Labs  Lab 04/10/23 1826 04/10/23 2035 04/11/23 0251 04/11/23 0515 04/11/23 0607 04/11/23 1002  NA 139 136 139 139 139 138  K 3.7 4.0 3.8 4.1 4.0 4.1  CL 98  --   --  99  --   --   CO2 21*  --   --  22   --   --   GLUCOSE 208*  --   --  107*  --   --   BUN 34*  --   --  35*  --   --   CREATININE 3.83*  --   --  3.88*  --   --   CALCIUM 8.1*  --   --  8.1*  --   --   MG 2.3  --   --  2.4  --   --   PHOS  --   --   --  7.6*  --   --    GFR: CrCl cannot be calculated (Unknown ideal weight.). Liver Function Tests: Recent Labs  Lab 04/10/23 1826 04/11/23 0515  AST 36 24  ALT 31 29  ALKPHOS 65 61  BILITOT 1.3* 1.2  PROT 5.9* 5.4*  ALBUMIN 2.5* 2.3*   No results for input(s): "LIPASE", "AMYLASE" in the last 168 hours. No results for input(s): "AMMONIA" in the last 168 hours. Coagulation Profile: No results for input(s): "INR", "PROTIME" in the last 168 hours. Cardiac Enzymes: No results for input(s): "CKTOTAL", "CKMB", "CKMBINDEX", "TROPONINI"  in the last 168 hours. BNP (last 3 results) No results for input(s): "PROBNP" in the last 8760 hours. HbA1C: No results for input(s): "HGBA1C" in the last 72 hours. CBG: Recent Labs  Lab 04/11/23 0813 04/11/23 1226  GLUCAP 74 79   Lipid Profile: No results for input(s): "CHOL", "HDL", "LDLCALC", "TRIG", "CHOLHDL", "LDLDIRECT" in the last 72 hours. Thyroid Function Tests: Recent Labs    04/10/23 1826  TSH 2.488   Anemia Panel: Recent Labs    04/11/23 0650  VITAMINB12 1,012*   Sepsis Labs: Recent Labs  Lab 04/10/23 1830 04/10/23 2035 04/11/23 0037 04/11/23 0515  PROCALCITON  --   --  0.53  --   LATICACIDVEN 3.1* 3.8*  --  2.1*    No results found for this or any previous visit (from the past 240 hours).   Radiology Studies: MR FOOT LEFT WO CONTRAST Result Date: 04/11/2023 CLINICAL DATA:  Left second toe infection. EXAM: MRI OF THE LEFT FOOT WITHOUT CONTRAST TECHNIQUE: Multiplanar, multisequence MR imaging of the left forefoot was performed. No intravenous contrast was administered. COMPARISON:  03/27/2023 and radiographs from 04/10/2023 FINDINGS: Bones/Joint/Cartilage Progressive abnormal edema distally in the  proximal phalanx second toe compatible with acute osteomyelitis. There is also accentuated T2 signal in the fused proximal and middle phalanges of the small toe suggesting osteomyelitis. Low-level marrow edema in the tuft of the distal phalanx third toe potentially from osteomyelitis. Postoperative findings in the first metatarsal with medial erosions along the first metatarsal head and nonspecific marrow edema dorsally along the first metatarsal head dorsally along the base of the proximal phalanx great toe similar to the previous exam. Appearance favors arthropathy with infection less likely given the lack of a joint effusion. Underlying gout is a distinct possibility given the medial erosions. Flattened/collapse head of the third metatarsal with arthropathy at the third MTP joint. Metal artifact distally in the second metatarsal compatible with prior operative intervention. Ligaments Lisfranc ligament intact. Muscles and Tendons Regional muscular edema likely neuropathic. Soft tissues Worsening dorsal subcutaneous edema in the forefoot, cellulitis is not excluded. Diffuse subcutaneous edema along the ball of the foot. Edema tracks into all of the toes. IMPRESSION: 1. Progressive abnormal edema distally in the proximal phalanx second toe compatible with acute osteomyelitis. 2. Accentuated T2 signal in the fused proximal and middle phalanges of the small toe suggesting osteomyelitis. 3. Low-level marrow edema in the tuft of the distal phalanx third toe potentially from osteomyelitis. 4. Postoperative findings in the first metatarsal with medial erosions along the first metatarsal head and nonspecific marrow edema dorsally along the first metatarsal head and dorsally along the base of the proximal phalanx great toe similar to the previous exam. Appearance favors arthropathy with infection less likely given the lack of a joint effusion. Underlying gout is a distinct possibility given the medial erosions. 5.  Flattened/collapse head of the third metatarsal with arthropathy at the third MTP joint. 6. Worsening dorsal subcutaneous edema in the forefoot, cellulitis is not excluded. Diffuse subcutaneous edema along the ball of the foot. 7. Regional muscular edema likely neuropathic. Electronically Signed   By: Gaylyn Rong M.D.   On: 04/11/2023 13:25   ECHOCARDIOGRAM LIMITED Result Date: 04/11/2023    ECHOCARDIOGRAM LIMITED REPORT   Patient Name:   Marnisha P Marschall Date of Exam: 04/11/2023 Medical Rec #:  [id number]        Height:       63.0 in Accession #:    [id number]  Weight:       112.4 lb Date of Birth:  02-07-1936        BSA:          1.514 m Patient Age:    87 years         BP:           98/58 mmHg Patient Gender: F                HR:           86 bpm. Exam Location:  Inpatient Procedure: Limited Echo, Limited Color Doppler and Color Doppler (Both Spectral            and Color Flow Doppler were utilized during procedure). Indications:    CHF                 Sepsis  History:        Patient has prior history of Echocardiogram examinations, most                 recent 01/30/2023. CHF, CAD, Arrythmias:Atrial Fibrillation;                 Risk Factors:Diabetes.  Sonographer:    Amy Chionchio Referring Phys: [id number] GRACE E BOWSER IMPRESSIONS  1. Left ventricular ejection fraction, by estimation, is 55 to 60%. The left ventricle has normal function. The left ventricle has no regional wall motion abnormalities. Left ventricular diastolic parameters are indeterminate. There is the interventricular septum is flattened in systole and diastole, consistent with right ventricular pressure and volume overload.  2. Right ventricular systolic function is moderately reduced. The right ventricular size is mildly enlarged. There is severely elevated pulmonary artery systolic pressure.  3. Left atrial size was moderately dilated.  4. Right atrial size was mildly dilated.  5. The mitral valve is normal in structure. Mild  mitral valve regurgitation. No evidence of mitral stenosis. Severe mitral annular calcification.  6. Tricuspid valve regurgitation is moderate.  7. The aortic valve is tricuspid. Aortic valve regurgitation is not visualized. Aortic valve sclerosis is present, with no evidence of aortic valve stenosis.  8. The inferior vena cava is dilated in size with <50% respiratory variability, suggesting right atrial pressure of 15 mmHg. FINDINGS  Left Ventricle: Left ventricular ejection fraction, by estimation, is 55 to 60%. The left ventricle has normal function. The left ventricle has no regional wall motion abnormalities. Strain imaging was not performed. The left ventricular internal cavity  size was normal in size. There is no left ventricular hypertrophy. The interventricular septum is flattened in systole and diastole, consistent with right ventricular pressure and volume overload. Left ventricular diastolic parameters are indeterminate. Right Ventricle: The right ventricular size is mildly enlarged. Right ventricular systolic function is moderately reduced. There is severely elevated pulmonary artery systolic pressure. The tricuspid regurgitant velocity is 3.44 m/s, and with an assumed right atrial pressure of 15 mmHg, the estimated right ventricular systolic pressure is 62.3 mmHg. Left Atrium: Left atrial size was moderately dilated. Right Atrium: Right atrial size was mildly dilated. Pericardium: There is no evidence of pericardial effusion. Mitral Valve: The mitral valve is normal in structure. Severe mitral annular calcification. Mild mitral valve regurgitation. No evidence of mitral valve stenosis. MV peak gradient, 5.5 mmHg. The mean mitral valve gradient is 1.0 mmHg. Tricuspid Valve: The tricuspid valve is normal in structure. Tricuspid valve regurgitation is moderate . No evidence of tricuspid stenosis. Aortic Valve: The aortic valve is tricuspid. Aortic valve  regurgitation is not visualized. Aortic valve  sclerosis is present, with no evidence of aortic valve stenosis. Pulmonic Valve: The pulmonic valve was normal in structure. Pulmonic valve regurgitation is trivial. No evidence of pulmonic stenosis. Aorta: The aortic root is normal in size and structure. Venous: The inferior vena cava is dilated in size with less than 50% respiratory variability, suggesting right atrial pressure of 15 mmHg. IAS/Shunts: No atrial level shunt detected by color flow Doppler. Additional Comments: 3D imaging was not performed.  LEFT VENTRICLE PLAX 2D LVOT diam:     1.90 cm LV SV:         32 LV SV Index:   21 LVOT Area:     2.84 cm  LV Volumes (MOD) LV vol d, MOD A2C: 60.2 ml LV vol d, MOD A4C: 50.8 ml LV vol s, MOD A2C: 30.6 ml LV vol s, MOD A4C: 23.8 ml LV SV MOD A2C:     29.6 ml LV SV MOD A4C:     50.8 ml LV SV MOD BP:      28.9 ml RIGHT VENTRICLE          IVC RV Basal diam:  3.40 cm  IVC diam: 2.40 cm RV Mid diam:    2.60 cm TAPSE (M-mode): 0.5 cm RIGHT ATRIUM           Index RA Area:     18.80 cm RA Volume:   51.20 ml  33.82 ml/m  AORTIC VALVE LVOT Vmax:   63.90 cm/s LVOT Vmean:  46.700 cm/s LVOT VTI:    0.113 m MITRAL VALVE             TRICUSPID VALVE MV Area VTI:  1.32 cm   TR Peak grad:   47.3 mmHg MV Peak grad: 5.5 mmHg   TR Vmax:        344.00 cm/s MV Mean grad: 1.0 mmHg MV Vmax:      1.17 m/s   SHUNTS MV Vmean:     49.1 cm/s  Systemic VTI:  0.11 m                          Systemic Diam: 1.90 cm Olga Millers MD Electronically signed by Olga Millers MD Signature Date/Time: 04/11/2023/12:22:46 PM    Final    DG CHEST PORT 1 VIEW Result Date: 04/11/2023 CLINICAL DATA:  Hypoxia EXAM: PORTABLE CHEST 1 VIEW COMPARISON:  04/10/2023 FINDINGS: Right dialysis catheter remains in place, unchanged. Heart is borderline in size. Mediastinal contours within normal limits. Aortic atherosclerosis. Increasing left basilar opacity. Right lung clear. No effusions or edema. No acute bony abnormality. IMPRESSION: Increasing left basilar  atelectasis or infiltrate. Electronically Signed   By: Charlett Nose M.D.   On: 04/11/2023 03:03   DG Foot Complete Left Result Date: 04/10/2023 CLINICAL DATA:  Shortness of breath.  Bilateral toe pain. EXAM: LEFT FOOT - COMPLETE 3+ VIEW COMPARISON:  03/26/2023. FINDINGS: No acute fracture or dislocation. No aggressive osseous lesion. Mild-to-moderate hallux valgus deformity. Redemonstration of metallic K-wire/screw overlying the distal first and second metatarsals. Mild diffuse degenerative changes of imaged joints. There is flattening and fragmentation of the head of the third metatarsal, likely sequela of prior trauma versus avascular necrosis. No focal soft tissue swelling. No focal soft tissue defect or air within the soft tissue. No radiopaque foreign bodies. IMPRESSION: *No acute osseous abnormality of the left foot. Correlate clinically to determine the need for additional imaging with MRI. Electronically  Signed   By: Jules Schick M.D.   On: 04/10/2023 18:45   DG Chest 2 View Result Date: 04/10/2023 CLINICAL DATA:  Shortness of breath. EXAM: CHEST - 2 VIEW COMPARISON:  03/26/2023. FINDINGS: Low lung volume. Bilateral lung fields are clear. Bilateral costophrenic angles are clear. Normal cardio-mediastinal silhouette. No acute osseous abnormalities. The soft tissues are within normal limits. Right sided central venous catheter noted with its tip overlying the right atrium, unchanged. IMPRESSION: No active cardiopulmonary disease. Electronically Signed   By: Jules Schick M.D.   On: 04/10/2023 18:39   Scheduled Meds:  amiodarone  200 mg Oral Daily   Chlorhexidine Gluconate Cloth  6 each Topical Q0600   doxercalciferol  3 mcg Intravenous Q M,W,F-HD   heparin  2,000 Units Intravenous Once   heparin sodium (porcine)       insulin aspart  0-6 Units Subcutaneous TID WC   lipase/protease/amylase  36,000 Units Oral TID WC   midodrine  10 mg Oral TID WC   multivitamin  1 tablet Oral QHS   sevelamer  carbonate  800 mg Oral TID WC   Continuous Infusions:  albumin human 25 g (04/11/23 1627)   ceFEPime (MAXIPIME) IV Stopped (04/10/23 2317)   heparin 900 Units/hr (04/11/23 1011)   [START ON 04/12/2023] vancomycin      LOS: 1 day   Marguerita Merles, DO [facility] Available via Epic secure chat 7am-7pm After these hours, please refer to coverage provider listed on [email or web address] 04/11/2023, 7:40 PM

## 2023-04-11 NOTE — ED Notes (Signed)
RRT placing pt on HHFNC

## 2023-04-11 NOTE — Progress Notes (Signed)
Received patient from ED on 6 liters of oxygen, alert and oriented times 4 and lethargic. Difficult to obtain a consistent Spo2 reading, which fluctuates between 90-100%. Respiratory team called and patient placed back on Bipap. Dr. Arlean Hopping aware. Ok to start HD.

## 2023-04-11 NOTE — Progress Notes (Signed)
Called by nursing patient hypoglycemic fingerstick blood 17, <10.  Glucagon given.  D5 LR at 30 cc ordered.  Repeat fingerstick blood sugar 141.  Patient up eating a meal.  Respiratory panel positive for influenza A.  Tamiflu started.

## 2023-04-11 NOTE — Progress Notes (Signed)
Called by RN to assess patient for Bipap. Pt was on 6lpm Sp02 reading 100%. Pt is responsive, currently on dialysis and I placed her on Bipap.

## 2023-04-11 NOTE — Hospital Course (Signed)
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  She was recently hospitalized and discharged on February 2025 for acute osteomyelitis involving the second left toe.  Dr. Tommie Ard at that time was consulted and recommended conservative measures and patient was given a course of oral doxycycline that was set to finish on 04/12/2023.  Subsequently after hospitalization she was hospitalized at another outside facility for 10 days for influenza infection.  She was discharged from the hospital 4 days ago but then presented to [facility] yesterday evening due to generalized weakness since the time of the hospitalization associated without any focal deficits.  She had noted decreased oral intake and had some shortness of breath from her setting of previous flu infection.  Son conveyed that she remain compliant with the doxycycline but he thought the left foot had slightly worsened over the course of this 12 days and patient became more confused.  She was worked up and brought into the ED and was found to be hypotensive and decompensated and had to be placed on supplemental oxygen and BiPAP.  He was weaned off of BiPAP to high flow nasal cannula.  She was admitted for severe sepsis likely due to acute osteomyelitis so we will repeat an MRI of the foot.  Respiratory status is still tenuous so we will continue supplemental oxygen.  Critical care was consulted and the patient stabilized and so they recommended the patient could remain on  hospitalist service.  Assessment and Plan:  Severe sepsis secondary to osteomyelitis likely of the left second toe with left lower extremity cellulitis complicated by recent influenza infection -She met sepsis criteria on admission with an elevated lactate, tachycardia, tachypnea on oxygen requirement  -Given IV fluid and had to be given 2 500 mL boluses given her end-stage renal disease -Was taking doxycycline -Check blood cultures and respiratory virus panel -Procalcitonin level is less than 0.10 -Continue with broad-spectrum antibiotics -WBC Trend: Recent Labs  Lab 03/26/23 1519 03/27/23 0622 03/28/23 0600 04/10/23 1826 04/11/23 0515  WBC 11.1* 10.6* 10.2 13.3* 14.6*  -Lactic acid level was done and showed: Recent Labs  Lab 03/26/23 1519 03/26/23 1803 04/10/23 1830 04/10/23 2035 04/11/23 0515  LATICACIDVEN 2.5* 1.8 3.1* 3.8* 2.1*  -Repeat for MRI of the foot and showed "Progressive abnormal edema distally in the proximal phalanx second toe compatible with acute osteomyelitis. Accentuated T2 signal in the fused proximal and middle phalanges of the small toe suggesting osteomyelitis. Low-level marrow edema in the tuft of the distal phalanx third toe potentially from osteomyelitis. Postoperative findings in the first metatarsal with medial erosions along the first metatarsal head and nonspecific marrow edema dorsally along the first metatarsal head and dorsally along the base of the proximal phalanx great toe similar to the previous exam. Appearance favors arthropathy with infection less likely given the lack of a joint effusion. Underlying gout is a distinct possibility given the medial erosions. Flattened/collapse head of the third metatarsal with arthropathy at the third MTP joint. Worsening dorsal subcutaneous edema in the forefoot, cellulitis is not excluded. Diffuse subcutaneous edema along the ball of the foot. Regional muscular edema likely neuropathic." -  Will check an ABI of  the lower extremities given that is difficult to palpate her pulse on the left foot and her foot was cold -Will need to discuss with Orthopedic Surgery message to Dr. Lajoyce Corners and he states that he will see the patient on Saturday morning  Acute encephalopathy in setting of sepsis -Continue treatment as above and continue with delirium precautions  Acute respiratory failure with hypercarbia and hypoxia Patient influenza A pneumonia and left lower lobe opacity -BiPAP as needed as she appears volume overloaded as well -ABG    Component Value Date/Time   PHART 7.26 (L) 04/11/2023 1634   PCO2ART 67 (HH) 04/11/2023 1634   PO2ART 127 (H) 04/11/2023 1634   HCO3 30.1 (H) 04/11/2023 1634   TCO2 31 04/11/2023 1002   ACIDBASEDEF 7.0 (H) 01/22/2022 1257   O2SAT 98.7 04/11/2023 1634  SpO2: 100 % O2 Flow Rate (L/min): 6 L/min FiO2 (%): 60 % -Continuous pulse oximetry maintain O2 saturations.  90% -Repeat chest x-ray in a.m. and continue supplemental oxygen via nasal cannula wean O2 as tolerated -PCCM consulted and agreed and patient underwent NIV PPV and dialysis which improved her condition  ESRD on hemodialysis Monday Wednesday Friday -Nephrology consulted for further evaluation and patient to be dialyzed this afternoon -BUN/Cr Trend: Recent Labs  Lab 03/26/23 1519 03/27/23 0622 03/28/23 0600 04/10/23 1826 04/11/23 0515  BUN 19 27* 45* 34* 35*  CREATININE 2.15* 2.71* 4.05* 3.83* 3.88*  -Judicious use of fluids given her ESRD -Avoid Nephrotoxic Medications, Contrast Dyes, Hypotension and Dehydration to Ensure Adequate Renal Perfusion and will need to Renally Adjust Meds -Continue to Monitor and Trend Renal Function carefully and repeat CMP in the AM   Volume overload in the setting of acute on chronic diastolic CHF Elevated troponin History of nonobstructive CAD Proximal atrial fibrillation but not on chronic anticoagulation given her history of significant bleeding -BNP was 2195.1 on  admission and repeat was 2179.6 and repeat an echo which showed an EF of 55 to 60% with indeterminate diastolic parameters and decreased right ventricular systolic function and severe pulmonary arterial hypertension -Her CHA2DS2-VASc score is 5 but not on chronic anticoagulation given history of GI bleed in September 2023 so just on prophylactic baby aspirin. -Holding metoprolol succinate for now given her hypotension on admission -Volume maintenance per dialysis -Cards resident evaluated and recommended to continue holding beta-blocker and amiodarone.  Continue with heparin drip for now  History of intraductal papillary mucinous neoplasm of the pancreas status post Whipple -With Creon with meals once diet is advanced  Diabetes mellitus type 2 -Her most recent hemoglobin A1c was 5.4% -Take sliding scale insulin 2 units 3 times daily with meals at home -Continue monitor blood sugars per protocol and she with low-dose sliding scale Recent Labs  Lab 03/28/23 0617 03/28/23 1202 03/28/23 1724 03/28/23 2033 03/29/23 0554 04/11/23 0813 04/11/23 1226  GLUCAP 224* 146* 197* 127* 212* 74 79   Normocytic Anemia/Anemia of Chronic Kidney Disease -Hgb/Hct Trend and likley hemoconcentrated on admission: Recent Labs  Lab 03/28/23 0600 04/10/23 1826 04/10/23 2035 04/11/23 0251 04/11/23 0515 04/11/23 0607 04/11/23 1002  HGB 8.0* 11.0* 14.3 12.6 10.8* 12.9 12.9  HCT 26.7* 37.6 42.0 37.0 38.2 38.0 38.0  MCV 96.0 97.2  --   --  100.5*  --   --   -Continue monitor for signs and symptoms bleeding now that she is on anticoagulation -Repeat CBC in a.m.  Hypoalbuminemia -Patient's Albumin Trend: Recent Labs  Lab 03/26/23 1519 03/27/23 0622  04/10/23 1826 04/11/23 0515  ALBUMIN 2.7* 2.1* 2.5* 2.3*  -Continue to Monitor and Trend and repeat CMP in the AM

## 2023-04-11 NOTE — ED Notes (Signed)
This nurse straight stuck the pt an additional 4 times and was able to get 5mL of blood. All blood was sent to the lab.

## 2023-04-11 NOTE — Consult Note (Signed)
NAME:  Kim Burns, MRN:  [id number], DOB:  1935-04-15, LOS: 1 ADMISSION DATE:  04/10/2023, CONSULTATION DATE:  04/11/23 REFERRING MD:  Marland Mcalpine - [facility] , CHIEF COMPLAINT:  AMS / hypotension    History of Present Illness:  88 yo F PMH diastolic HF, afib RVR, ESRd on HD, RCC s/p ablation c.b urine leak s/p neph tube, pancreatic neoplasm s/p whipple, DM with complications, L toe oseto, recent hospitalization at OSH 2/12-15 for Flu A PNA who presented to ED 2/20 w L foot redness and feeling unwell.  She is currently Rx doxy and is [facility] to complete her abx course 04/12/23.   In ED, c/f severe sepsis possible cellulitis, and known osteo. Started on vanc cefepime. Was hypotensive overnight and req an IVF bolus. 2/21 she had a hypotensive reading + AMS + incr O2 req   PCCM consulted in this setting   Pertinent  Medical History  ESRD RCC Afib RVR S/p whipple  DM w foot ulcer L toe osteo  Significant Hospital Events: Including procedures, antibiotic start and stop dates in addition to other pertinent events   2/20 admit [facility] sev sepsis 2/21 hypotensive, PCCm consult   Interim History / Subjective:  Hypotensive NIBP read   Objective   Blood pressure (!) 98/58, pulse 89, temperature 97.7 F (36.5 C), temperature source Oral, resp. rate 13, SpO2 98%.    FiO2 (%):  [40 %] 40 % PEEP:  [5 cmH20] 5 cmH20   Intake/Output Summary (Last 24 hours) at 04/11/2023 1121 Last data filed at 04/10/2023 2317 Gross per 24 hour  Intake 800 ml  Output --  Net 800 ml   There were no vitals filed for this visit.  Examination: General: chronically ill frail appearing F  HENT: dry mm NCAT  Lungs: Upper rhonchi  Cardiovascular: irregular. S1s2. Cap refill < 3 sec.  Abdomen: soft  Extremities: L>R Lower extremity edema, erythema  Neuro: AAOx2-3  GU: defer   Resolved Hospital Problem list     Assessment & Plan:   Acute encephalopathy P -delirium precautions -tx underlying causes as below    Severe sepsis  -initially there was concern for hypotension but sounds like this was NIBP position related  Osteomyelitis L second toe related to infected diabetic foot sound  Possible LLE cellulitis  -Rx doxy 2/8-2/22/25  P -Bcx, RVP -LA -stop current fluid bolus -agree w broad abx-- vanc cefepime is fine   Acute hypoxic resp failure w hypoxia and hypercarbia  LLL opacity  Possible decomp HF / volume overload  Recent Flu A PNA  -admitted [facility]/12-2/[facility]  P -PRN BiPAP -wean O2 for goal >92 -send RVP, covid, flu -- would not be surprised if she tags as FluA positive still. She did receive tamiflu at OSH admission  -MRI L foot at some point   Afib, not on chronic AC with hx significant bleeding  Diastolic HF  Hx non obstructive CAD  -cardiology has seen  -holding BB -amio  -on hep gtt   Lactic acidosis -in setting of above processes -trend -supportive care   ESRD on HD  L sided RCC s/p embo / ablation (2023) c/b delayed urine leak s/p Neph tube  (last exchanged 2/17) -nephro consult   Hx intraductal papillary mucinous neoplasm of pancreas s/p whipple in 2018  P -creon w meals when taking POs   GOC -ACP docs reviewed, sounds like at last talk she had wanted to try a few days of all aggressive things &  is full code  -it does seem like she has had a lot of recent healthcare touches, and seems significantly deconditioned + chronically ill -pending her course the next several days, consider palliative consult   Dispo: At this time, does not need ICU admission and is stable for [facility] admission and progressive. PCCM will sign off please re-engage if we can be of further help or it pts clinical status changes   Best Practice (right click and "Reselect all SmartList Selections" daily)   Per primary   Labs   CBC: Recent Labs  Lab 04/10/23 1826 04/10/23 2035 04/11/23 0251 04/11/23 0515 04/11/23 0607 04/11/23 1002  WBC 13.3*  --   --  14.6*  --   --    NEUTROABS 11.8*  --   --   --   --   --   HGB 11.0* 14.3 12.6 10.8* 12.9 12.9  HCT 37.6 42.0 37.0 38.2 38.0 38.0  MCV 97.2  --   --  100.5*  --   --   PLT 198  --   --  200  --   --     Basic Metabolic Panel: Recent Labs  Lab 04/10/23 1826 04/10/23 2035 04/11/23 0251 04/11/23 0515 04/11/23 0607 04/11/23 1002  NA 139 136 139 139 139 138  K 3.7 4.0 3.8 4.1 4.0 4.1  CL 98  --   --  99  --   --   CO2 21*  --   --  22  --   --   GLUCOSE 208*  --   --  107*  --   --   BUN 34*  --   --  35*  --   --   CREATININE 3.83*  --   --  3.88*  --   --   CALCIUM 8.1*  --   --  8.1*  --   --   MG 2.3  --   --  2.4  --   --   PHOS  --   --   --  7.6*  --   --    GFR: Estimated Creatinine Clearance: 8.5 mL/min (A) (by C-G formula based on SCr of 3.88 mg/dL (H)). Recent Labs  Lab 04/10/23 1826 04/10/23 1830 04/10/23 2035 04/11/23 0037 04/11/23 0515  PROCALCITON  --   --   --  0.53  --   WBC 13.3*  --   --   --  14.6*  LATICACIDVEN  --  3.1* 3.8*  --  2.1*    Liver Function Tests: Recent Labs  Lab 04/10/23 1826 04/11/23 0515  AST 36 24  ALT 31 29  ALKPHOS 65 61  BILITOT 1.3* 1.2  PROT 5.9* 5.4*  ALBUMIN 2.5* 2.3*   No results for input(s): "LIPASE", "AMYLASE" in the last 168 hours. No results for input(s): "AMMONIA" in the last 168 hours.  ABG    Component Value Date/Time   PHART 7.232 (L) 04/11/2023 1002   PCO2ART 68.5 (HH) 04/11/2023 1002   PO2ART 86 04/11/2023 1002   HCO3 28.9 (H) 04/11/2023 1002   TCO2 31 04/11/2023 1002   ACIDBASEDEF 7.0 (H) 01/22/2022 1257   O2SAT 94 04/11/2023 1002     Coagulation Profile: No results for input(s): "INR", "PROTIME" in the last 168 hours.  Cardiac Enzymes: No results for input(s): "CKTOTAL", "CKMB", "CKMBINDEX", "TROPONINI" in the last 168 hours.  HbA1C: Hgb A1c MFr Bld  Date/Time Value Ref Range Status  03/27/2023 06:22 AM 5.4 4.8 -  5.6 % Final    Comment:    (NOTE) Pre diabetes:          5.7%-6.4%  Diabetes:               >6.4%  Glycemic control for   <7.0% adults with diabetes   10/27/2022 10:45 AM 6.1 (H) 4.8 - 5.6 % Final    Comment:    (NOTE) Pre diabetes:          5.7%-6.4%  Diabetes:              >6.4%  Glycemic control for   <7.0% adults with diabetes     CBG: Recent Labs  Lab 04/11/23 0813  GLUCAP 74    Review of Systems:   + cough   Past Medical History:  She,  has a past medical history of Anemia of chronic disease, Anxiety, Atrial fibrillation (HCC), Blood transfusion without reported diagnosis, Bradycardia, Diabetes mellitus without complication (HCC), Diarrhea, Dysrhythmia (04/16/2016), ESRD on hemodialysis (HCC), GERD (gastroesophageal reflux disease), Gout, Headache, History of kidney stones (06/2013), Hyperparathyroidism (HCC), Hypertension, PONV (postoperative nausea and vomiting), Sleep apnea, Stroke (HCC) (04/16/2016), and Vitamin D deficiency.   Surgical History:   Past Surgical History:  Procedure Laterality Date   A/V FISTULAGRAM Left 08/30/2021   Procedure: A/V Fistulagram;  Surgeon: Cephus Shelling, MD;  Location: [facility] INVASIVE CV LAB;  Service: Cardiovascular;  Laterality: Left;   A/V FISTULAGRAM N/A 02/25/2023   Procedure: A/V Fistulagram;  Surgeon: Maeola Harman, MD;  Location: [facility] INVASIVE CV LAB;  Service: Vascular;  Laterality: N/A;   ABDOMINAL HYSTERECTOMY  1985   complete   BACK SURGERY  1980   lower   BASCILIC VEIN TRANSPOSITION Left 04/24/2017   Procedure: LEFT ARM FIRST STAGE BASILIC VEIN TRANSPOSITION;  Surgeon: Nada Libman, MD;  Location: [facility] OR;  Service: Vascular;  Laterality: Left;   BASCILIC VEIN TRANSPOSITION Left 07/10/2017   Procedure: SECOND STAGE BASILIC VEIN TRANSPOSITION LEFT ARM;  Surgeon: Nada Libman, MD;  Location: [facility] OR;  Service: Vascular;  Laterality: Left;   BREAST LUMPECTOMY Left x 2   many years apart, benign   CHOLECYSTECTOMY  1985   CYSTOSCOPY W/ URETERAL STENT PLACEMENT Left 10/31/2021   Procedure:  CYSTOSCOPY WITH RETROGRADE PYELOGRAM/URETERAL STENT PLACEMENT;  Surgeon: Crist Fat, MD;  Location[facility] OR;  Service: Urology;  Laterality: Left;   CYSTOSCOPY WITH URETEROSCOPY AND STENT PLACEMENT Left 06/18/2013   Procedure: CYSTOSCOPY WITH Lef URETEROSCOPY AND Left STENT PLACEMENT;  Surgeon: Crecencio Mc, MD;  Location: [facility] ORS;  Service: Urology;  Laterality: Left;   DIALYSIS/PERMA CATHETER INSERTION N/A 03/13/2023   Procedure: DIALYSIS/PERMA CATHETER INSERTION;  Surgeon: Dagoberto Ligas, MD;  Location: [facility] INVASIVE CV LAB;  Service: Cardiovascular;  Laterality: N/A;   ESOPHAGOGASTRODUODENOSCOPY (EGD) WITH PROPOFOL N/A 11/13/2016   Procedure: ESOPHAGOGASTRODUODENOSCOPY (EGD) WITH PROPOFOL;  Surgeon: Kathi Der, MD;  Location: [facility] ENDOSCOPY;  Service: Gastroenterology;  Laterality: N/A;   EUS N/A 02/07/2016   Procedure: ESOPHAGEAL ENDOSCOPIC ULTRASOUND (EUS) RADIAL;  Surgeon: Willis Modena, MD;  Location: [facility] ENDOSCOPY;  Service: Endoscopy;  Laterality: N/A;   EYE SURGERY Bilateral 2014   ioc for catracts    FOOT SURGERY Left 1990   something with toes unsure what    HERNIA REPAIR  2018   IR CATHETER TUBE CHANGE  03/14/2022   IR EMBO TUMOR ORGAN ISCHEMIA INFARCT INC GUIDE ROADMAPPING  08/22/2021   IR NEPHROSTOMY EXCHANGE LEFT  06/27/2022   IR NEPHROSTOMY EXCHANGE LEFT  08/26/2022   IR NEPHROSTOMY EXCHANGE LEFT  10/22/2022   IR NEPHROSTOMY EXCHANGE LEFT  12/16/2022   IR NEPHROSTOMY EXCHANGE LEFT  12/30/2022   IR NEPHROSTOMY EXCHANGE LEFT  02/10/2023   IR NEPHROSTOMY EXCHANGE LEFT  04/07/2023   IR NEPHROSTOMY PLACEMENT LEFT  03/14/2022   IR RADIOLOGIST EVAL & MGMT  05/18/2021   IR RADIOLOGIST EVAL & MGMT  10/01/2021   IR RADIOLOGIST EVAL & MGMT  11/12/2021   IR RADIOLOGIST EVAL & MGMT  11/22/2021   IR RADIOLOGIST EVAL & MGMT  11/27/2021   IR RADIOLOGIST EVAL & MGMT  12/20/2021   IR RENAL SUPRASEL UNI S&I MOD SED  08/22/2021   IR US GUIDE VASC ACCESS RIGHT  08/22/2021   LEFT HEART  CATH AND CORONARY ANGIOGRAPHY N/A 08/27/2021   Procedure: LEFT HEART CATH AND CORONARY ANGIOGRAPHY;  Surgeon: Kathleene Hazel, MD;  Location: [facility] INVASIVE CV LAB;  Service: Cardiovascular;  Laterality: N/A;   PARATHYROID EXPLORATION     PERIPHERAL VASCULAR BALLOON ANGIOPLASTY Left 08/30/2021   Procedure: PERIPHERAL VASCULAR BALLOON ANGIOPLASTY;  Surgeon: Cephus Shelling, MD;  Location: [facility] INVASIVE CV LAB;  Service: Cardiovascular;  Laterality: Left;  arm fistula   PERIPHERAL VASCULAR BALLOON ANGIOPLASTY Left 02/25/2023   Procedure: PERIPHERAL VASCULAR BALLOON ANGIOPLASTY;  Surgeon: Maeola Harman, MD;  Location: [facility] INVASIVE CV LAB;  Service: Vascular;  Laterality: Left;  left AVF   RADIOLOGY WITH ANESTHESIA Left 08/22/2021   Procedure: MICROWAVE ABLATION;  Surgeon: Simonne Come, MD;  Location: [facility] ORS;  Service: Anesthesiology;  Laterality: Left;   WHIPPLE PROCEDURE N/A 04/23/2016   Procedure: WHIPPLE PROCEDURE;  Surgeon: Almond Lint, MD;  Location: [facility] OR;  Service: General;  Laterality: N/A;     Social History:   reports that she has never smoked. She has never used smokeless tobacco. She reports that she does not drink alcohol and does not use drugs.   Family History:  Her family history includes Alzheimer's disease in her sister and sister; Cancer in her brother and father; Heart disease in her mother; Stroke in her mother. There is no history of Breast cancer.   Allergies Allergies  Allergen Reactions   Lopid [Gemfibrozil] Other (See Comments)    Myalgia    Lotemax [Loteprednol Etabonate] Rash    Skin burning  Blurry vision   Statins Other (See Comments)    Rosuvastatin Muscle weakness   Norco [Hydrocodone-Acetaminophen] Other (See Comments)    Headaches  Tolerates acetaminophen    Other Diarrhea    Real Butter - diarrhea; patient still uses butter   Vibra-Tab [Doxycycline] Other (See Comments)    Unknown reaction   Amlodipine Other (See Comments)     Norvasc   Codeine Other (See Comments)    headache   Hydrocodone Other (See Comments)   Hydrocodone-Acetaminophen Other (See Comments)    Headache. Tolerates acetaminophen.   Lisinopril Other (See Comments)   Verapamil Other (See Comments)     Home Medications  Prior to Admission medications   Medication Sig Start Date End Date Taking? Authorizing Provider  acetaminophen (TYLENOL) 500 MG tablet Take 1,000 mg by mouth every 6 (six) hours as needed for mild pain (pain score 1-3) or headache. 01/02/23  Yes [provider]  amiodarone (PACERONE) 200 MG tablet Take 1 tablet (200 mg total) by mouth daily. 11/15/22  Yes Love, Evlyn Kanner, PA-C  aspirin 81 MG chewable tablet Chew 1 tablet (81 mg total) by mouth daily. Patient taking differently: Chew 81 mg by mouth  daily. Take 81mg  by mouth every morning except on dialysis days, hold in the morning and take after dialysis. 11/13/22  Yes Love, Evlyn Kanner, PA-C  B Complex-C-Zn-Folic Acid (DIALYVITE 800 WITH ZINC) 0.8 MG TABS Take 1 tablet by mouth daily.   Yes [provider]  benzonatate (TESSALON) 200 MG capsule Take 200 mg by mouth 3 (three) times daily as needed. 04/08/23  Yes [provider]  dextromethorphan-guaiFENesin (MUCINEX DM) 30-600 MG 12hr tablet Take 1 tablet by mouth 2 (two) times daily as needed for cough.   Yes [provider]  diphenhydramine-acetaminophen (TYLENOL PM) 25-500 MG TABS tablet Take 2 tablets by mouth at bedtime.   Yes [provider]  doxycycline (VIBRA-TABS) 100 MG tablet Take 1 tablet (100 mg total) by mouth 2 (two) times daily for 14 days. 03/29/23 04/12/23 Yes Pokhrel, Laxman, MD  hydrALAZINE (APRESOLINE) 50 MG tablet Take 50 mg by mouth See admin instructions.   Yes [provider]  insulin aspart (NOVOLOG FLEXPEN) 100 UNIT/ML FlexPen Inject 5-7 Units into the skin 3 (three) times daily with meals. Sliding scale.   Yes [provider]  Lifitegrast 5 % SOLN  Administer 1 drop into both eyes in the morning and 1 drop before bedtime. 03/10/23  Yes [provider]  lipase/protease/amylase (CREON) 36000 UNITS CPEP capsule Take 36,000 Units by mouth See admin instructions. Take 36,000 units (1 capsule) by mouth with meals and snacks.   Yes [provider]  meclizine (ANTIVERT) 25 MG tablet Take 1 tablet (25 mg total) by mouth 3 (three) times daily as needed for dizziness. 01/02/23 01/02/24 Yes Gillis Santa, MD  metoprolol succinate (TOPROL-XL) 50 MG 24 hr tablet Take 1 tablet (50 mg total) by mouth 4 (four) times a week. Take on nondialysis days, Tuesday, Thursday, Saturday and Sunday if systolic BP greater than 130 mmHg Patient taking differently: Take 50 mg by mouth See admin instructions. Take up to 4 times weekly depending on BP reading.  Take on nondialysis days, Tuesday, Thursday, Saturday and Sunday if systolic BP greater than 130 mmHg 04/10/23  Yes [place], Peter M, MD  midodrine (PROAMATINE) 10 MG tablet Take 1 tablet (10 mg total) by mouth See admin instructions. Take 10mg  by mouth before dialysis. Bring with you to dialysis in case another dose is required. May take 10 mg 3 times a day if systolic BP less than 120 mmHg 01/02/23 01/02/24 Yes Gillis Santa, MD  pantoprazole (PROTONIX) 40 MG tablet Take by mouth. 04/05/23 05/05/23 Yes [provider]  Polyethyl Glycol-Propyl Glycol (SYSTANE HYDRATION PF OP) Place 1 drop into both eyes 3 (three) times daily.   Yes [provider]  rOPINIRole (REQUIP) 0.25 MG tablet Take 1 tablet (0.25 mg total) by mouth 2 (two) times daily. Patient taking differently: Take 0.75 mg by mouth at bedtime. 09/24/22  Yes Rodolph Bong, MD  sevelamer carbonate (RENVELA) 800 MG tablet Take 800 mg by mouth 3 (three) times daily with meals. 03/21/23  Yes [provider]  White Petrolatum-Mineral Oil (SYSTANE NIGHTTIME) OINT Apply 1 Application to eye at bedtime as needed (severe dry eyes).    Yes [provider]  lidocaine-prilocaine (EMLA) cream Apply 1 Application topically every Monday, Wednesday, and Friday with hemodialysis. Patient not taking: Reported on 04/11/2023    [provider]  olopatadine (PATADAY) 0.1 % ophthalmic solution Place 1 drop into both eyes 2 (two) times daily as needed for allergies. Patient not taking: Reported on 04/11/2023  [provider]     Critical care time: na      High MDM   [name] MSN, AGACNP-BC [facility] Pulmonary/Critical Care Medicine Amion for pager  04/11/2023, 11:21 AM

## 2023-04-11 NOTE — Progress Notes (Signed)
Pts ABG labs are back CO2--67 Ph 7.26 O2 127 HCO3 30.1  All numbers are improved from the last set of ABGs  Intermittent O2 readings are 96 to 100% No change in pts neuro baseline

## 2023-04-12 DIAGNOSIS — M869 Osteomyelitis, unspecified: Secondary | ICD-10-CM

## 2023-04-12 DIAGNOSIS — R7989 Other specified abnormal findings of blood chemistry: Secondary | ICD-10-CM | POA: Diagnosis not present

## 2023-04-12 DIAGNOSIS — I480 Paroxysmal atrial fibrillation: Secondary | ICD-10-CM | POA: Diagnosis not present

## 2023-04-12 DIAGNOSIS — N186 End stage renal disease: Secondary | ICD-10-CM | POA: Diagnosis not present

## 2023-04-12 DIAGNOSIS — A419 Sepsis, unspecified organism: Secondary | ICD-10-CM | POA: Diagnosis not present

## 2023-04-12 LAB — PROCALCITONIN: Procalcitonin: 0.47 ng/mL

## 2023-04-12 LAB — GLUCOSE, CAPILLARY
Glucose-Capillary: 164 mg/dL — ABNORMAL HIGH (ref 70–99)
Glucose-Capillary: 108 mg/dL — ABNORMAL HIGH (ref 70–99)
Glucose-Capillary: 114 mg/dL — ABNORMAL HIGH (ref 70–99)
Glucose-Capillary: 107 mg/dL — ABNORMAL HIGH (ref 70–99)
Glucose-Capillary: 87 mg/dL (ref 70–99)

## 2023-04-12 LAB — LACTIC ACID, PLASMA: Lactic Acid, Venous: 2.9 mmol/L (ref 0.5–1.9)

## 2023-04-12 MED ORDER — HEPARIN SODIUM (PORCINE) 5000 UNIT/ML IJ SOLN
5000.0000 [IU] | Freq: Three times a day (TID) | INTRAMUSCULAR | Status: DC
Start: 2023-04-12 — End: 2023-04-19
  Administered 2023-04-12: 5000 [IU] via SUBCUTANEOUS
  Filled 2023-04-12 (×6): qty 1

## 2023-04-12 MED ORDER — OSELTAMIVIR PHOSPHATE 30 MG PO CAPS
30.0000 mg | ORAL_CAPSULE | Freq: Once | ORAL | Status: DC
  Filled 2023-04-12: qty 1

## 2023-04-12 MED ORDER — HALOPERIDOL LACTATE 5 MG/ML IJ SOLN
5.0000 mg | Freq: Once | INTRAMUSCULAR | Status: AC
  Administered 2023-04-12: 5 mg via INTRAVENOUS
  Filled 2023-04-12: qty 1

## 2023-04-12 NOTE — Progress Notes (Signed)
 PROGRESS NOTE    Kim Burns  ZOX:[id number] DOB: February 08, 1936 DOA: 04/10/2023 PCP: Lewis Moccasin, MD    Chief Complaint  Patient presents with   Shortness of Breath    Brief Narrative:   88 year old chronically ill-appearing Caucasian female with a past medical history significant for end-stage renal disease on hemodialysis Monday Wednesday Friday, and intraductal papillary mucinous neoplasm of the pancreas status post Whipple procedure in 2018, persistent atrial fibrillation not on anticoagulation, hypertension, diabetes mellitus type 2, chronic diastolic CHF, anemia chronic disease . Presents to ED secondary to severe sepsis, osteomyelitis and lower extremity cellulitis, complicated by recent influenza infection, hypotensive, required significant IV fluid resuscitation, then she developed respiratory failure in the setting of volume overload, requiring BiPAP support, significant improvement of respiratory status after 2 L ultrafiltration hemodialysis, significantly improved with antibiotics.   Assessment & Plan:   Principal Problem:   Generalized weakness Active Problems:   Severe sepsis (HCC)   ESRD (end stage renal disease) (HCC)   Lactic acidosis   Paroxysmal atrial fibrillation (HCC)   DM2 (diabetes mellitus, type 2) (HCC)   Chronic diastolic CHF (congestive heart failure) (HCC)   History of anemia due to chronic kidney disease   Elevated troponin   Acute osteomyelitis (HCC)   Acute on chronic respiratory failure with hypoxia and hypercapnia (HCC)   Osteomyelitis of second toe of left foot (HCC)   Sepsis, present on admission secondary to left lower foot cellulitis with second toe osteomyelitis as well influenza infection -sepsis present on admission -No further IV fluid given pulmonary edema and respiratory failure -Continue with doxycycline at home -Continue with broad-spectrum antibiotics over next 24 to 48 hours and narrow after that -MRI foot significant  for second great toe osteomyelitis, Dr. Lajoyce Corners input greatly appreciated, continue with local wound care, and narrow antibiotics with recommendation for outpatient follow-up  Acute metabolic encephalopathy secondary to hypercarbia and sepsis -Patient much improved   Acute respiratory failure with hypercarbia and hypoxia Patient influenza A pneumonia and left lower lobe opacity Iatrogenic volume overload, acute on chronic diastolic CHF -Required BiPAP support, respiratory status much improved, remains on 8 L nasal cannula -When severe sepsis she required ample IV fluid resuscitation -Much improved after hemodialysis  -Continue with BiPAP as needed -Volume management with HD -Continue with IV antibiotic for possible pneumonia -Continue with Tamiflu  Hypotension -Continue with midodrine, as well can be related to sepsis  Elevated troponins History of CAD Paroxysmal A-fib not on chronic anticoagulation due to history of GI bleed -Troponins elevated on admission, due to demand ischemia in the setting of sepsis, hypoxia and ESRD -Initial heparin GTT, no indication for anticoagulation per cardiology on admission, discontinued this patient with her significant easy bruising and history of GI bleed -Candidate for anticoagulation due to her A-fib  Anemia of end-stage renal disease -IV iron and Procrit  per renal as needed  Diabetes mellitus, type II, uncontrolled with hypoglycemia -Most recent A1c 5.4  -She is with hypoglycemia yesterday requiring D5 drip, will decrease drip rate and insulin sliding scale  History of intraductal papillary mucinous neoplasm of the pancreas status post Whipple -With Creon with meals once diet is advanced  Renal cell carcinoma -Status post ablation, complicated by urine leak, status post nephrostomy tube  ESRD, on hemodialysis -Renal consulted, received HD yesterday-   DVT prophylaxis: SCD, heparin drip has been discontinued. Code Status: Full Family  Communication: none at bedside Disposition:   Status is: Inpatient    Consultants:  Renal  Ortho Dr Lajoyce Corners PCCM   Subjective:  No significant events overnight as discussed with staff  Objective: Vitals:   04/12/23 0600 04/12/23 0800 04/12/23 1000 04/12/23 1140  BP: (!) 81/37 119/73 111/66 (!) 107/92  Pulse: (!) 127 (!) 114 95 94  Resp:  18 16 16   Temp:  98 F (36.7 C) 98.2 F (36.8 C) 98.2 F (36.8 C)  TempSrc:  Oral Oral Oral  SpO2: 96% 99% 99% 93%    Intake/Output Summary (Last 24 hours) at 04/12/2023 1527 Last data filed at 04/12/2023 1149 Gross per 24 hour  Intake --  Output 90 ml  Net -90 ml   There were no vitals filed for this visit.  Examination:  HEENT lethargic this morning, but more awake and interactive later in the day, extremely frail, deconditioned Symmetrical Chest wall movement, diminished air entry bilaterally RRR,No Gallops,Rubs or new Murmurs, No Parasternal Heave +ve B.Sounds, Abd Soft, No tenderness, No rebound - guarding or rigidity. Left lower extremity with erythema, ulceration in the second toe         Data Reviewed: I have personally reviewed following labs and imaging studies  CBC: Recent Labs  Lab 04/10/23 1826 04/10/23 2035 04/11/23 0251 04/11/23 0515 04/11/23 0607 04/11/23 1002 04/11/23 2050  WBC 13.3*  --   --  14.6*  --   --  16.4*  NEUTROABS 11.8*  --   --   --   --   --   --   HGB 11.0*   < > 12.6 10.8* 12.9 12.9 11.1*  HCT 37.6   < > 37.0 38.2 38.0 38.0 37.2  MCV 97.2  --   --  100.5*  --   --  94.7  PLT 198  --   --  200  --   --  235   < > = values in this interval not displayed.    Basic Metabolic Panel: Recent Labs  Lab 04/10/23 1826 04/10/23 2035 04/11/23 0251 04/11/23 0515 04/11/23 0607 04/11/23 1002  NA 139 136 139 139 139 138  K 3.7 4.0 3.8 4.1 4.0 4.1  CL 98  --   --  99  --   --   CO2 21*  --   --  22  --   --   GLUCOSE 208*  --   --  107*  --   --   BUN 34*  --   --  35*  --   --    CREATININE 3.83*  --   --  3.88*  --   --   CALCIUM 8.1*  --   --  8.1*  --   --   MG 2.3  --   --  2.4  --   --   PHOS  --   --   --  7.6*  --   --     GFR: CrCl cannot be calculated (Unknown ideal weight.).  Liver Function Tests: Recent Labs  Lab 04/10/23 1826 04/11/23 0515  AST 36 24  ALT 31 29  ALKPHOS 65 61  BILITOT 1.3* 1.2  PROT 5.9* 5.4*  ALBUMIN 2.5* 2.3*    CBG: Recent Labs  Lab 04/11/23 2120 04/11/23 2144 04/12/23 0133 04/12/23 0846 04/12/23 1224  GLUCAP <10* 141* 164* 108* 114*     Recent Results (from the past 240 hours)  Respiratory (~20 pathogens) panel by PCR     Status: Abnormal   Collection Time: 04/11/23  9:52 AM   Specimen: Nasopharyngeal  Swab; Respiratory  Result Value Ref Range Status   Adenovirus NOT DETECTED NOT DETECTED Final   Coronavirus 229E NOT DETECTED NOT DETECTED Final    Comment: (NOTE) The Coronavirus on the Respiratory Panel, DOES NOT test for the novel  Coronavirus (2019 nCoV)    Coronavirus HKU1 NOT DETECTED NOT DETECTED Final   Coronavirus NL63 NOT DETECTED NOT DETECTED Final   Coronavirus OC43 NOT DETECTED NOT DETECTED Final   Metapneumovirus NOT DETECTED NOT DETECTED Final   Rhinovirus / Enterovirus NOT DETECTED NOT DETECTED Final   Influenza A H3 DETECTED (A) NOT DETECTED Final   Influenza B NOT DETECTED NOT DETECTED Final   Parainfluenza Virus 1 NOT DETECTED NOT DETECTED Final   Parainfluenza Virus 2 NOT DETECTED NOT DETECTED Final   Parainfluenza Virus 3 NOT DETECTED NOT DETECTED Final   Parainfluenza Virus 4 NOT DETECTED NOT DETECTED Final   Respiratory Syncytial Virus NOT DETECTED NOT DETECTED Final   Bordetella pertussis NOT DETECTED NOT DETECTED Final   Bordetella Parapertussis NOT DETECTED NOT DETECTED Final   Chlamydophila pneumoniae NOT DETECTED NOT DETECTED Final   Mycoplasma pneumoniae NOT DETECTED NOT DETECTED Final    Comment: Performed at [facility], [address] [place], [place] [address]   Resp panel by RT-PCR (RSV, Flu A&B, Covid) Nasopharyngeal Swab     Status: Abnormal   Collection Time: 04/11/23  9:52 AM   Specimen: Nasopharyngeal Swab; Nasal Swab  Result Value Ref Range Status   SARS Coronavirus 2 by RT PCR NEGATIVE NEGATIVE Final   Influenza A by PCR POSITIVE (A) NEGATIVE Final   Influenza B by PCR NEGATIVE NEGATIVE Final    Comment: (NOTE) The Xpert Xpress SARS-CoV-2/FLU/RSV plus assay is intended as an aid in the diagnosis of influenza from Nasopharyngeal swab specimens and should not be used as a sole basis for treatment. Nasal washings and aspirates are unacceptable for Xpert Xpress SARS-CoV-2/FLU/RSV testing.  Fact Sheet for Patients: BloggerCourse.com  Fact Sheet for Healthcare Providers: SeriousBroker.it  This test is not yet approved or cleared by the [place] FDA and has been authorized for detection and/or diagnosis of SARS-CoV-2 by FDA under an Emergency Use Authorization (EUA). This EUA will remain in effect (meaning this test can be used) for the duration of the COVID-19 declaration under Section 564(b)(1) of the Act, 21 U.S.C. section 360bbb-3(b)(1), unless the authorization is terminated or revoked.     Resp Syncytial Virus by PCR NEGATIVE NEGATIVE Final    Comment: (NOTE) Fact Sheet for Patients: BloggerCourse.com  Fact Sheet for Healthcare Providers: SeriousBroker.it  This test is not yet approved or cleared by the [place] FDA and has been authorized for detection and/or diagnosis of SARS-CoV-2 by FDA under an Emergency Use Authorization (EUA). This EUA will remain in effect (meaning this test can be used) for the duration of the COVID-19 declaration under Section 564(b)(1) of the Act, 21 U.S.C. section 360bbb-3(b)(1), unless the authorization is terminated or revoked.  Performed at [facility], [address],  [place], [place] [address]   Culture, blood (Routine X 2) w Reflex to ID Panel     Status: None (Preliminary result)   Collection Time: 04/11/23  8:50 PM   Specimen: BLOOD  Result Value Ref Range Status   Specimen Description BLOOD SITE NOT SPECIFIED  Final   Special Requests   Final    BOTTLES DRAWN AEROBIC AND ANAEROBIC Blood Culture results may not be optimal due to an inadequate volume of blood  received in culture bottles   Culture   Final    NO GROWTH < 12 HOURS Performed at [facility], [address], [place], [place] [address]    Report Status PENDING  Incomplete  Culture, blood (Routine X 2) w Reflex to ID Panel     Status: None (Preliminary result)   Collection Time: 04/11/23 11:52 PM   Specimen: BLOOD RIGHT HAND  Result Value Ref Range Status   Specimen Description BLOOD RIGHT HAND  Final   Special Requests   Final    BOTTLES DRAWN AEROBIC ONLY Blood Culture results may not be optimal due to an inadequate volume of blood received in culture bottles   Culture   Final    NO GROWTH < 12 HOURS Performed at [facility], [address], [place], [place] [address]    Report Status PENDING  Incomplete         Radiology Studies: MR FOOT LEFT WO CONTRAST Result Date: 04/11/2023 CLINICAL DATA:  Left second toe infection. EXAM: MRI OF THE LEFT FOOT WITHOUT CONTRAST TECHNIQUE: Multiplanar, multisequence MR imaging of the left forefoot was performed. No intravenous contrast was administered. COMPARISON:  03/27/2023 and radiographs from 04/10/2023 FINDINGS: Bones/Joint/Cartilage Progressive abnormal edema distally in the proximal phalanx second toe compatible with acute osteomyelitis. There is also accentuated T2 signal in the fused proximal and middle phalanges of the small toe suggesting osteomyelitis. Low-level marrow edema in the tuft of the distal phalanx third toe potentially from osteomyelitis. Postoperative findings in the first metatarsal with medial erosions along the  first metatarsal head and nonspecific marrow edema dorsally along the first metatarsal head dorsally along the base of the proximal phalanx great toe similar to the previous exam. Appearance favors arthropathy with infection less likely given the lack of a joint effusion. Underlying gout is a distinct possibility given the medial erosions. Flattened/collapse head of the third metatarsal with arthropathy at the third MTP joint. Metal artifact distally in the second metatarsal compatible with prior operative intervention. Ligaments Lisfranc ligament intact. Muscles and Tendons Regional muscular edema likely neuropathic. Soft tissues Worsening dorsal subcutaneous edema in the forefoot, cellulitis is not excluded. Diffuse subcutaneous edema along the ball of the foot. Edema tracks into all of the toes. IMPRESSION: 1. Progressive abnormal edema distally in the proximal phalanx second toe compatible with acute osteomyelitis. 2. Accentuated T2 signal in the fused proximal and middle phalanges of the small toe suggesting osteomyelitis. 3. Low-level marrow edema in the tuft of the distal phalanx third toe potentially from osteomyelitis. 4. Postoperative findings in the first metatarsal with medial erosions along the first metatarsal head and nonspecific marrow edema dorsally along the first metatarsal head and dorsally along the base of the proximal phalanx great toe similar to the previous exam. Appearance favors arthropathy with infection less likely given the lack of a joint effusion. Underlying gout is a distinct possibility given the medial erosions. 5. Flattened/collapse head of the third metatarsal with arthropathy at the third MTP joint. 6. Worsening dorsal subcutaneous edema in the forefoot, cellulitis is not excluded. Diffuse subcutaneous edema along the ball of the foot. 7. Regional muscular edema likely neuropathic. Electronically Signed   By: Gaylyn Rong M.D.   On: 04/11/2023 13:25   ECHOCARDIOGRAM  LIMITED Result Date: 04/11/2023    ECHOCARDIOGRAM LIMITED REPORT   Patient Name:   Kim Burns Date of Exam: 04/11/2023 Medical Rec #:  [id number]        Height:  63.0 in Accession #:    [id number]       Weight:       112.4 lb Date of Birth:  08-Aug-1935        BSA:          1.514 m Patient Age:    87 years         BP:           98/58 mmHg Patient Gender: F                HR:           86 bpm. Exam Location:  Inpatient Procedure: Limited Echo, Limited Color Doppler and Color Doppler (Both Spectral            and Color Flow Doppler were utilized during procedure). Indications:    CHF                 Sepsis  History:        Patient has prior history of Echocardiogram examinations, most                 recent 01/30/2023. CHF, CAD, Arrythmias:Atrial Fibrillation;                 Risk Factors:Diabetes.  Sonographer:    Amy Chionchio Referring Phys: [id number] GRACE E BOWSER IMPRESSIONS  1. Left ventricular ejection fraction, by estimation, is 55 to 60%. The left ventricle has normal function. The left ventricle has no regional wall motion abnormalities. Left ventricular diastolic parameters are indeterminate. There is the interventricular septum is flattened in systole and diastole, consistent with right ventricular pressure and volume overload.  2. Right ventricular systolic function is moderately reduced. The right ventricular size is mildly enlarged. There is severely elevated pulmonary artery systolic pressure.  3. Left atrial size was moderately dilated.  4. Right atrial size was mildly dilated.  5. The mitral valve is normal in structure. Mild mitral valve regurgitation. No evidence of mitral stenosis. Severe mitral annular calcification.  6. Tricuspid valve regurgitation is moderate.  7. The aortic valve is tricuspid. Aortic valve regurgitation is not visualized. Aortic valve sclerosis is present, with no evidence of aortic valve stenosis.  8. The inferior vena cava is dilated in size with <50% respiratory  variability, suggesting right atrial pressure of 15 mmHg. FINDINGS  Left Ventricle: Left ventricular ejection fraction, by estimation, is 55 to 60%. The left ventricle has normal function. The left ventricle has no regional wall motion abnormalities. Strain imaging was not performed. The left ventricular internal cavity  size was normal in size. There is no left ventricular hypertrophy. The interventricular septum is flattened in systole and diastole, consistent with right ventricular pressure and volume overload. Left ventricular diastolic parameters are indeterminate. Right Ventricle: The right ventricular size is mildly enlarged. Right ventricular systolic function is moderately reduced. There is severely elevated pulmonary artery systolic pressure. The tricuspid regurgitant velocity is 3.44 m/s, and with an assumed right atrial pressure of 15 mmHg, the estimated right ventricular systolic pressure is 62.3 mmHg. Left Atrium: Left atrial size was moderately dilated. Right Atrium: Right atrial size was mildly dilated. Pericardium: There is no evidence of pericardial effusion. Mitral Valve: The mitral valve is normal in structure. Severe mitral annular calcification. Mild mitral valve regurgitation. No evidence of mitral valve stenosis. MV peak gradient, 5.5 mmHg. The mean mitral valve gradient is 1.0 mmHg. Tricuspid Valve: The tricuspid valve is normal in structure. Tricuspid valve regurgitation is moderate .  No evidence of tricuspid stenosis. Aortic Valve: The aortic valve is tricuspid. Aortic valve regurgitation is not visualized. Aortic valve sclerosis is present, with no evidence of aortic valve stenosis. Pulmonic Valve: The pulmonic valve was normal in structure. Pulmonic valve regurgitation is trivial. No evidence of pulmonic stenosis. Aorta: The aortic root is normal in size and structure. Venous: The inferior vena cava is dilated in size with less than 50% respiratory variability, suggesting right atrial  pressure of 15 mmHg. IAS/Shunts: No atrial level shunt detected by color flow Doppler. Additional Comments: 3D imaging was not performed.  LEFT VENTRICLE PLAX 2D LVOT diam:     1.90 cm LV SV:         32 LV SV Index:   21 LVOT Area:     2.84 cm  LV Volumes (MOD) LV vol d, MOD A2C: 60.2 ml LV vol d, MOD A4C: 50.8 ml LV vol s, MOD A2C: 30.6 ml LV vol s, MOD A4C: 23.8 ml LV SV MOD A2C:     29.6 ml LV SV MOD A4C:     50.8 ml LV SV MOD BP:      28.9 ml RIGHT VENTRICLE          IVC RV Basal diam:  3.40 cm  IVC diam: 2.40 cm RV Mid diam:    2.60 cm TAPSE (M-mode): 0.5 cm RIGHT ATRIUM           Index RA Area:     18.80 cm RA Volume:   51.20 ml  33.82 ml/m  AORTIC VALVE LVOT Vmax:   63.90 cm/s LVOT Vmean:  46.700 cm/s LVOT VTI:    0.113 m MITRAL VALVE             TRICUSPID VALVE MV Area VTI:  1.32 cm   TR Peak grad:   47.3 mmHg MV Peak grad: 5.5 mmHg   TR Vmax:        344.00 cm/s MV Mean grad: 1.0 mmHg MV Vmax:      1.17 m/s   SHUNTS MV Vmean:     49.1 cm/s  Systemic VTI:  0.11 m                          Systemic Diam: 1.90 cm Olga Millers MD Electronically signed by Olga Millers MD Signature Date/Time: 04/11/2023/12:22:46 PM    Final    DG CHEST PORT 1 VIEW Result Date: 04/11/2023 CLINICAL DATA:  Hypoxia EXAM: PORTABLE CHEST 1 VIEW COMPARISON:  04/10/2023 FINDINGS: Right dialysis catheter remains in place, unchanged. Heart is borderline in size. Mediastinal contours within normal limits. Aortic atherosclerosis. Increasing left basilar opacity. Right lung clear. No effusions or edema. No acute bony abnormality. IMPRESSION: Increasing left basilar atelectasis or infiltrate. Electronically Signed   By: Charlett Nose M.D.   On: 04/11/2023 03:03   DG Foot Complete Left Result Date: 04/10/2023 CLINICAL DATA:  Shortness of breath.  Bilateral toe pain. EXAM: LEFT FOOT - COMPLETE 3+ VIEW COMPARISON:  03/26/2023. FINDINGS: No acute fracture or dislocation. No aggressive osseous lesion. Mild-to-moderate hallux valgus  deformity. Redemonstration of metallic K-wire/screw overlying the distal first and second metatarsals. Mild diffuse degenerative changes of imaged joints. There is flattening and fragmentation of the head of the third metatarsal, likely sequela of prior trauma versus avascular necrosis. No focal soft tissue swelling. No focal soft tissue defect or air within the soft tissue. No radiopaque foreign bodies. IMPRESSION: *No acute osseous abnormality of the  left foot. Correlate clinically to determine the need for additional imaging with MRI. Electronically Signed   By: Jules Schick M.D.   On: 04/10/2023 18:45   DG Chest 2 View Result Date: 04/10/2023 CLINICAL DATA:  Shortness of breath. EXAM: CHEST - 2 VIEW COMPARISON:  03/26/2023. FINDINGS: Low lung volume. Bilateral lung fields are clear. Bilateral costophrenic angles are clear. Normal cardio-mediastinal silhouette. No acute osseous abnormalities. The soft tissues are within normal limits. Right sided central venous catheter noted with its tip overlying the right atrium, unchanged. IMPRESSION: No active cardiopulmonary disease. Electronically Signed   By: Jules Schick M.D.   On: 04/10/2023 18:39        Scheduled Meds:  amiodarone  200 mg Oral Daily   Chlorhexidine Gluconate Cloth  6 each Topical Q0600   doxercalciferol  3 mcg Intravenous Q M,W,F-HD   insulin aspart  0-6 Units Subcutaneous TID WC   lipase/protease/amylase  36,000 Units Oral TID WC   midodrine  10 mg Oral TID WC   multivitamin  1 tablet Oral QHS   [START ON 04/13/2023] oseltamivir  30 mg Oral Q48H   oseltamivir  30 mg Oral Once   sevelamer carbonate  800 mg Oral TID WC   Continuous Infusions:  albumin human 25 g (04/11/23 1627)   ceFEPime (MAXIPIME) IV 1 g (04/12/23 0113)   dextrose 5% lactated ringers 30 mL/hr at 04/11/23 2135   vancomycin       LOS: 2 days        Huey Bienenstock, MD [facility]   To contact the attending provider between 7A-7P or the  covering provider during after hours 7P-7A, please log into the web site [email or web address] and access using universal [place] password for that web site. If you do not have the password, please call the hospital operator.  04/12/2023, 3:27 PM

## 2023-04-12 NOTE — Evaluation (Signed)
 Occupational Therapy Evaluation Patient Details Name: Kim Burns MRN: [id number] DOB: [birth year] Today's Date: 04/12/2023   History of Present Illness   88 y.o. female presents to ED 2/20 secondary to severe sepsis, osteomyelitis and lower extremity cellulitis, complicated by recent influenza infection, hypotensive, developed respiratory failure in the setting of volume overload, requiring BiPAP support, significant improvement of respiratory status after 2 L ultrafiltration hemodialysis, significantly improved with antibiotics. PMH - afib, ESRD on HD, chf, DM, HTN, Whipple, gout, bradycardia, TIA, CVA.     Clinical Impressions Pt admitted for above, PTA pt was ambulatory at home with rollator + CGA from family and Mod I for ADLs. Pt currently needing min A to ambulate with RW to assist with RW management and demonstrates decreased activity tolerance, Max A to CGA for ADL completion . After ambulating ~6ft she reported an RPE of 6/10, unable to obtain her pleth but Sp02 looked good upon return to rest shortly after. Pt would benefit from continued acute skilled OT services to address listed deficits and educate on energy conservation prn. Patient would benefit from post acute Home OT services to help maximize functional independence in natural environment      If plan is discharge home, recommend the following:   A little help with walking and/or transfers;A little help with bathing/dressing/bathroom;Assist for transportation;Help with stairs or ramp for entrance;Assistance with cooking/housework     Functional Status Assessment   Patient has had a recent decline in their functional status and demonstrates the ability to make significant improvements in function in a reasonable and predictable amount of time.     Equipment Recommendations   None recommended by OT     Recommendations for Other Services         Precautions/Restrictions   Precautions Precautions:  Fall Recall of Precautions/Restrictions: Intact Precaution/Restrictions Comments: Lt nephrostomy drain Required Braces or Orthoses: Other Brace Other Brace: L post-op shoe (Not found in room) Restrictions Weight Bearing Restrictions Per Provider Order: Yes LLE Weight Bearing Per Provider Order: Weight bearing as tolerated (From prior visit, no new orders)     Mobility Bed Mobility Overal bed mobility: Needs Assistance Bed Mobility: Rolling, Sidelying to Sit, Sit to Supine Rolling: Min assist, Used rails Sidelying to sit: Min assist, Used rails   Sit to supine: Min assist   General bed mobility comments: MIn A needed for LLE management and to raise trunk    Transfers Overall transfer level: Needs assistance Equipment used: Rolling walker (2 wheels) Transfers: Sit to/from Stand Sit to Stand: Mod assist                  Balance Overall balance assessment: Needs assistance, History of Falls Sitting-balance support: No upper extremity supported, Feet supported Sitting balance-Leahy Scale: Good     Standing balance support: During functional activity, Bilateral upper extremity supported, Reliant on assistive device for balance Standing balance-Leahy Scale: Poor Standing balance comment: reliant on RW support                           ADL either performed or assessed with clinical judgement   ADL Overall ADL's : Needs assistance/impaired Eating/Feeding: Independent;Sitting   Grooming: Sitting;Brushing hair;Set up   Upper Body Bathing: Sitting;Set up   Lower Body Bathing: Sitting/lateral leans;Contact guard assist   Upper Body Dressing : Sitting;Contact guard assist   Lower Body Dressing: Sitting/lateral leans;Contact guard assist   Toilet Transfer: Rolling walker (2 wheels);Ambulation;Minimal assistance  Toileting- Clothing Manipulation and Hygiene: Maximal assistance;Sit to/from stand       Functional mobility during ADLs: Minimal  assistance;Rolling walker (2 wheels) General ADL Comments: reinforced pursed lip breathing pt having hard time due to a clogged nostril     Vision         Perception         Praxis         Pertinent Vitals/Pain Pain Assessment Pain Assessment: No/denies pain     Extremity/Trunk Assessment Upper Extremity Assessment Upper Extremity Assessment: Generalized weakness   Lower Extremity Assessment Lower Extremity Assessment: Generalized weakness   Cervical / Trunk Assessment Cervical / Trunk Assessment: Kyphotic   Communication Communication Communication: No apparent difficulties Factors Affecting Communication: Hearing impaired   Cognition Arousal: Alert Behavior During Therapy: WFL for tasks assessed/performed Cognition: No family/caregiver present to determine baseline                               Following commands: Intact       Cueing  General Comments   Cueing Techniques: Verbal cues      Exercises     Shoulder Instructions      Home Living Family/patient expects to be discharged to:: Private residence Living Arrangements: Children Available Help at Discharge: Family;Available 24 hours/day Type of Home: House Home Access: Ramped entrance     Home Layout: Able to live on main level with bedroom/bathroom;Laundry or work area in basement     [employer] Shower/Tub: Producer, television/film/video: Handicapped height Bathroom Accessibility: Yes   Home Equipment: Agricultural consultant (2 wheels);Shower seat;Rollator (4 wheels);Wheelchair - manual;Cane - single point;Tub bench   Additional Comments: Information obtained from most recent visit earlier this month. Pt currently providing alternate information that does not seem accurate. Will need to confirm with family; "Has 24/7 assist from either son or daughter. Children switch providing assistance"      Prior Functioning/Environment Prior Level of Function : Independent/Modified  Independent;History of Falls (last six months)             Mobility Comments: uses rollator or RW, family is close by when pt is ambulating ADLs Comments: mod I    OT Problem List: Decreased activity tolerance;Impaired balance (sitting and/or standing);Decreased strength;Cardiopulmonary status limiting activity   OT Treatment/Interventions: Self-care/ADL training;Visual/perceptual remediation/compensation;Therapeutic exercise;Patient/family education;DME and/or AE instruction;Therapeutic activities;Balance training      OT Goals(Current goals can be found in the care plan section)   Acute Rehab OT Goals Patient Stated Goal: To get warm OT Goal Formulation: With patient Time For Goal Achievement: 04/26/23 Potential to Achieve Goals: Good ADL Goals Pt Will Perform Grooming: with supervision;standing Pt Will Perform Lower Body Dressing: with supervision;sit to/from stand Pt Will Transfer to Toilet: with supervision;ambulating Additional ADL Goal #1: Pt will demonstrate independent use of energy conservation strategies prn to complete functional tasks   OT Frequency:  Min 1X/week    Co-evaluation              AM-PAC OT "6 Clicks" Daily Activity     Outcome Measure Help from another person eating meals?: None Help from another person taking care of personal grooming?: A Little Help from another person toileting, which includes using toliet, bedpan, or urinal?: A Lot Help from another person bathing (including washing, rinsing, drying)?: A Little Help from another person to put on and taking off regular upper body clothing?: A Little Help from another  person to put on and taking off regular lower body clothing?: A Little 6 Click Score: 18   End of Session Equipment Utilized During Treatment: Gait belt;Rolling walker (2 wheels) Nurse Communication: Mobility status  Activity Tolerance: Patient tolerated treatment well Patient left: in bed;with call bell/phone within  reach;with chair alarm set  OT Visit Diagnosis: Unsteadiness on feet (R26.81);History of falling (Z91.81)                Time: 1610-9604 OT Time Calculation (min): 23 min Charges:  OT General Charges $OT Visit: 1 Visit OT Evaluation $OT Eval Moderate Complexity: 1 Mod OT Treatments $Therapeutic Activity: 8-22 mins  04/12/2023  AB, OTR/L  Acute Rehabilitation Services  Office: [phone]   Tristan Schroeder 04/12/2023, 6:18 PM

## 2023-04-12 NOTE — Consult Note (Signed)
 ORTHOPAEDIC CONSULTATION  REQUESTING PHYSICIAN: Elgergawy, Leana Roe, MD  Chief Complaint: Ulceration PIP joint left foot second toe  HPI: Kim Burns is a 88 y.o. female who presents with chronic ulcer PIP joint left foot second toe.  Patient has multiple medical problems including end-stage renal disease on dialysis, diabetes, with peripheral artery disease.  Past Medical History:  Diagnosis Date   Anemia of chronic disease    takes iron   Anxiety    Atrial fibrillation (HCC)    Blood transfusion without reported diagnosis    Bradycardia    Diabetes mellitus without complication (HCC)    became diabetic after Whipple procedure   Diarrhea    Dysrhythmia 04/16/2016   bradycardia due to medication    ESRD on hemodialysis [facility])    M-W-F   GERD (gastroesophageal reflux disease)    Gout    Headache    History of kidney stones 06/2013   Hyperparathyroidism (HCC)    Hypertension    PONV (postoperative nausea and vomiting)    Sleep apnea    no cpap machine. could not tolerate   Stroke (HCC) 04/16/2016   TIA 1995   Vitamin D deficiency    Past Surgical History:  Procedure Laterality Date   A/V FISTULAGRAM Left 08/30/2021   Procedure: A/V Fistulagram;  Surgeon: Cephus Shelling, MD;  Location: [facility] INVASIVE CV LAB;  Service: Cardiovascular;  Laterality: Left;   A/V FISTULAGRAM N/A 02/25/2023   Procedure: A/V Fistulagram;  Surgeon: Maeola Harman, MD;  Location: [facility] INVASIVE CV LAB;  Service: Vascular;  Laterality: N/A;   ABDOMINAL HYSTERECTOMY  1985   complete   BACK SURGERY  1980   lower   BASCILIC VEIN TRANSPOSITION Left 04/24/2017   Procedure: LEFT ARM FIRST STAGE BASILIC VEIN TRANSPOSITION;  Surgeon: Nada Libman, MD;  Location: [facility] OR;  Service: Vascular;  Laterality: Left;   BASCILIC VEIN TRANSPOSITION Left 07/10/2017   Procedure: SECOND STAGE BASILIC VEIN TRANSPOSITION LEFT ARM;  Surgeon: Nada Libman, MD;  Location: [facility] OR;  Service:  Vascular;  Laterality: Left;   BREAST LUMPECTOMY Left x 2   many years apart, benign   CHOLECYSTECTOMY  1985   CYSTOSCOPY W/ URETERAL STENT PLACEMENT Left 10/31/2021   Procedure: CYSTOSCOPY WITH RETROGRADE PYELOGRAM/URETERAL STENT PLACEMENT;  Surgeon: Crist Fat, MD;  Location: [facility] OR;  Service: Urology;  Laterality: Left;   CYSTOSCOPY WITH URETEROSCOPY AND STENT PLACEMENT Left 06/18/2013   Procedure: CYSTOSCOPY WITH Lef URETEROSCOPY AND Left STENT PLACEMENT;  Surgeon: Crecencio Mc, MD;  Location: [facility] ORS;  Service: Urology;  Laterality: Left;   DIALYSIS/PERMA CATHETER INSERTION N/A 03/13/2023   Procedure: DIALYSIS/PERMA CATHETER INSERTION;  Surgeon: Dagoberto Ligas, MD;  Location: [facility] INVASIVE CV LAB;  Service: Cardiovascular;  Laterality: N/A;   ESOPHAGOGASTRODUODENOSCOPY (EGD) WITH PROPOFOL N/A 11/13/2016   Procedure: ESOPHAGOGASTRODUODENOSCOPY (EGD) WITH PROPOFOL;  Surgeon: Kathi Der, MD;  Location: [facility] ENDOSCOPY;  Service: Gastroenterology;  Laterality: N/A;   EUS N/A 02/07/2016   Procedure: ESOPHAGEAL ENDOSCOPIC ULTRASOUND (EUS) RADIAL;  Surgeon: Willis Modena, MD;  Location: [facility] ENDOSCOPY;  Service: Endoscopy;  Laterality: N/A;   EYE SURGERY Bilateral 2014   ioc for catracts    FOOT SURGERY Left 1990   something with toes unsure what    HERNIA REPAIR  2018   IR CATHETER TUBE CHANGE  03/14/2022   IR EMBO TUMOR ORGAN ISCHEMIA INFARCT INC GUIDE ROADMAPPING  08/22/2021   IR NEPHROSTOMY EXCHANGE LEFT  06/27/2022   IR NEPHROSTOMY  EXCHANGE LEFT  08/26/2022   IR NEPHROSTOMY EXCHANGE LEFT  10/22/2022   IR NEPHROSTOMY EXCHANGE LEFT  12/16/2022   IR NEPHROSTOMY EXCHANGE LEFT  12/30/2022   IR NEPHROSTOMY EXCHANGE LEFT  02/10/2023   IR NEPHROSTOMY EXCHANGE LEFT  04/07/2023   IR NEPHROSTOMY PLACEMENT LEFT  03/14/2022   IR RADIOLOGIST EVAL & MGMT  05/18/2021   IR RADIOLOGIST EVAL & MGMT  10/01/2021   IR RADIOLOGIST EVAL & MGMT  11/12/2021   IR RADIOLOGIST EVAL & MGMT  11/22/2021   IR  RADIOLOGIST EVAL & MGMT  11/27/2021   IR RADIOLOGIST EVAL & MGMT  12/20/2021   IR RENAL SUPRASEL UNI S&I MOD SED  08/22/2021   IR US GUIDE VASC ACCESS RIGHT  08/22/2021   LEFT HEART CATH AND CORONARY ANGIOGRAPHY N/A 08/27/2021   Procedure: LEFT HEART CATH AND CORONARY ANGIOGRAPHY;  Surgeon: Kathleene Hazel, MD;  Location: [facility] INVASIVE CV LAB;  Service: Cardiovascular;  Laterality: N/A;   PARATHYROID EXPLORATION     PERIPHERAL VASCULAR BALLOON ANGIOPLASTY Left 08/30/2021   Procedure: PERIPHERAL VASCULAR BALLOON ANGIOPLASTY;  Surgeon: Cephus Shelling, MD;  Location: [facility] INVASIVE CV LAB;  Service: Cardiovascular;  Laterality: Left;  arm fistula   PERIPHERAL VASCULAR BALLOON ANGIOPLASTY Left 02/25/2023   Procedure: PERIPHERAL VASCULAR BALLOON ANGIOPLASTY;  Surgeon: Maeola Harman, MD;  Location: [facility] INVASIVE CV LAB;  Service: Vascular;  Laterality: Left;  left AVF   RADIOLOGY WITH ANESTHESIA Left 08/22/2021   Procedure: MICROWAVE ABLATION;  Surgeon: Simonne Come, MD;  Location: [facility] ORS;  Service: Anesthesiology;  Laterality: Left;   WHIPPLE PROCEDURE N/A 04/23/2016   Procedure: WHIPPLE PROCEDURE;  Surgeon: Almond Lint, MD;  Location: [facility] OR;  Service: General;  Laterality: N/A;   Social History   Socioeconomic History   Marital status: Widowed    Spouse name: Not on file   Number of children: Not on file   Years of education: Not on file   Highest education level: Not on file  Occupational History   Not on file  Tobacco Use   Smoking status: Never   Smokeless tobacco: Never  Vaping Use   Vaping status: Never Used  Substance and Sexual Activity   Alcohol use: No   Drug use: No   Sexual activity: Not on file  Other Topics Concern   Not on file  Social History Narrative   Not on file   Social Drivers of Health   Financial Resource Strain: Low Risk  (04/02/2023)   Received from [facility] [employer] Strain (CARDIA)    Difficulty  of Paying Living Expenses: Not hard at all  Food Insecurity: No Food Insecurity (04/12/2023)   Hunger Vital Sign    Worried About Running Out of Food in the Last Year: Never true    Ran Out of Food in the Last Year: Never true  Transportation Needs: No Transportation Needs (04/12/2023)   PRAPARE - Administrator, Civil Service (Medical): No    Lack of Transportation (Non-Medical): No  Physical Activity: Inactive (02/06/2023)   Exercise Vital Sign    Days of Exercise per Week: 0 days    Minutes of Exercise per Session: 0 min  Stress: No Stress Concern Present (01/08/2023)   [employer] of Occupational Health - Occupational Stress Questionnaire    Feeling of Stress : Not at all  Social Connections: Socially Isolated (04/12/2023)   Social Connection and Isolation Panel [NHANES]    Frequency of Communication  with Friends and Family: More than three times a week    Frequency of Social Gatherings with Friends and Family: More than three times a week    Attends Religious Services: Never    Database administrator or Organizations: No    Attends Banker Meetings: Never    Marital Status: Widowed   Family History  Problem Relation Age of Onset   Heart disease Mother    Stroke Mother    Cancer Father        colon   Alzheimer's disease Sister    Alzheimer's disease Sister    Cancer Brother        brain   Breast cancer Neg Hx    - negative except otherwise stated in the family history section Allergies  Allergen Reactions   Lopid [Gemfibrozil] Other (See Comments)    Myalgia    Lotemax [Loteprednol Etabonate] Rash    Skin burning  Blurry vision   Statins Other (See Comments)    Rosuvastatin Muscle weakness   Norco [Hydrocodone-Acetaminophen] Other (See Comments)    Headaches  Tolerates acetaminophen    Other Diarrhea    Real Butter - diarrhea; patient still uses butter   Vibra-Tab [Doxycycline] Other (See Comments)    Unknown reaction   Amlodipine  Other (See Comments)    Norvasc   Codeine Other (See Comments)    headache   Hydrocodone Other (See Comments)   Hydrocodone-Acetaminophen Other (See Comments)    Headache. Tolerates acetaminophen.   Lisinopril Other (See Comments)   Verapamil Other (See Comments)   Prior to Admission medications   Medication Sig Start Date End Date Taking? Authorizing Provider  acetaminophen (TYLENOL) 500 MG tablet Take 1,000 mg by mouth every 6 (six) hours as needed for mild pain (pain score 1-3) or headache. 01/02/23  Yes [provider]  amiodarone (PACERONE) 200 MG tablet Take 1 tablet (200 mg total) by mouth daily. 11/15/22  Yes Love, Evlyn Kanner, PA-C  aspirin 81 MG chewable tablet Chew 1 tablet (81 mg total) by mouth daily. Patient taking differently: Chew 81 mg by mouth daily. Take 81mg  by mouth every morning except on dialysis days, hold in the morning and take after dialysis. 11/13/22  Yes Love, Evlyn Kanner, PA-C  B Complex-C-Zn-Folic Acid (DIALYVITE 800 WITH ZINC) 0.8 MG TABS Take 1 tablet by mouth daily.   Yes [provider]  benzonatate (TESSALON) 200 MG capsule Take 200 mg by mouth 3 (three) times daily as needed. 04/08/23  Yes [provider]  dextromethorphan-guaiFENesin (MUCINEX DM) 30-600 MG 12hr tablet Take 1 tablet by mouth 2 (two) times daily as needed for cough.   Yes [provider]  diphenhydramine-acetaminophen (TYLENOL PM) 25-500 MG TABS tablet Take 2 tablets by mouth at bedtime.   Yes [provider]  doxycycline (VIBRA-TABS) 100 MG tablet Take 1 tablet (100 mg total) by mouth 2 (two) times daily for 14 days. 03/29/23 04/12/23 Yes Pokhrel, Laxman, MD  hydrALAZINE (APRESOLINE) 50 MG tablet Take 50 mg by mouth See admin instructions.   Yes [provider]  insulin aspart (NOVOLOG FLEXPEN) 100 UNIT/ML FlexPen Inject 5-7 Units into the skin 3 (three) times daily with meals. Sliding scale.   Yes [provider]  Lifitegrast 5 % SOLN  Administer 1 drop into both eyes in the morning and 1 drop before bedtime. 03/10/23  Yes [provider]  lipase/protease/amylase (CREON) 36000 UNITS CPEP capsule Take 36,000 Units by mouth See admin instructions. Take  36,000 units (1 capsule) by mouth with meals and snacks.   Yes [provider]  meclizine (ANTIVERT) 25 MG tablet Take 1 tablet (25 mg total) by mouth 3 (three) times daily as needed for dizziness. 01/02/23 01/02/24 Yes Gillis Santa, MD  metoprolol succinate (TOPROL-XL) 50 MG 24 hr tablet Take 1 tablet (50 mg total) by mouth 4 (four) times a week. Take on nondialysis days, Tuesday, Thursday, Saturday and Sunday if systolic BP greater than 130 mmHg Patient taking differently: Take 50 mg by mouth See admin instructions. Take up to 4 times weekly depending on BP reading.  Take on nondialysis days, Tuesday, Thursday, Saturday and Sunday if systolic BP greater than 130 mmHg 04/10/23  Yes [place], Peter M, MD  midodrine (PROAMATINE) 10 MG tablet Take 1 tablet (10 mg total) by mouth See admin instructions. Take 10mg  by mouth before dialysis. Bring with you to dialysis in case another dose is required. May take 10 mg 3 times a day if systolic BP less than 120 mmHg 01/02/23 01/02/24 Yes Gillis Santa, MD  pantoprazole (PROTONIX) 40 MG tablet Take by mouth. 04/05/23 05/05/23 Yes [provider]  Polyethyl Glycol-Propyl Glycol (SYSTANE HYDRATION PF OP) Place 1 drop into both eyes 3 (three) times daily.   Yes [provider]  rOPINIRole (REQUIP) 0.25 MG tablet Take 1 tablet (0.25 mg total) by mouth 2 (two) times daily. Patient taking differently: Take 0.75 mg by mouth at bedtime. 09/24/22  Yes Rodolph Bong, MD  sevelamer carbonate (RENVELA) 800 MG tablet Take 800 mg by mouth 3 (three) times daily with meals. 03/21/23  Yes [provider]  White Petrolatum-Mineral Oil (SYSTANE NIGHTTIME) OINT Apply 1 Application to eye at bedtime as needed (severe dry eyes).    Yes [provider]  lidocaine-prilocaine (EMLA) cream Apply 1 Application topically every Monday, Wednesday, and Friday with hemodialysis. Patient not taking: Reported on 04/11/2023    [provider]  olopatadine (PATADAY) 0.1 % ophthalmic solution Place 1 drop into both eyes 2 (two) times daily as needed for allergies. Patient not taking: Reported on 04/11/2023    [provider]   MR FOOT LEFT WO CONTRAST Result Date: 04/11/2023 CLINICAL DATA:  Left second toe infection. EXAM: MRI OF THE LEFT FOOT WITHOUT CONTRAST TECHNIQUE: Multiplanar, multisequence MR imaging of the left forefoot was performed. No intravenous contrast was administered. COMPARISON:  03/27/2023 and radiographs from 04/10/2023 FINDINGS: Bones/Joint/Cartilage Progressive abnormal edema distally in the proximal phalanx second toe compatible with acute osteomyelitis. There is also accentuated T2 signal in the fused proximal and middle phalanges of the small toe suggesting osteomyelitis. Low-level marrow edema in the tuft of the distal phalanx third toe potentially from osteomyelitis. Postoperative findings in the first metatarsal with medial erosions along the first metatarsal head and nonspecific marrow edema dorsally along the first metatarsal head dorsally along the base of the proximal phalanx great toe similar to the previous exam. Appearance favors arthropathy with infection less likely given the lack of a joint effusion. Underlying gout is a distinct possibility given the medial erosions. Flattened/collapse head of the third metatarsal with arthropathy at the third MTP joint. Metal artifact distally in the second metatarsal compatible with prior operative intervention. Ligaments Lisfranc ligament intact. Muscles and Tendons Regional muscular edema likely neuropathic. Soft tissues Worsening dorsal subcutaneous edema in the forefoot, cellulitis is not excluded. Diffuse subcutaneous edema along the ball of the  foot. Edema tracks into all of the toes. IMPRESSION: 1. Progressive abnormal edema distally  in the proximal phalanx second toe compatible with acute osteomyelitis. 2. Accentuated T2 signal in the fused proximal and middle phalanges of the small toe suggesting osteomyelitis. 3. Low-level marrow edema in the tuft of the distal phalanx third toe potentially from osteomyelitis. 4. Postoperative findings in the first metatarsal with medial erosions along the first metatarsal head and nonspecific marrow edema dorsally along the first metatarsal head and dorsally along the base of the proximal phalanx great toe similar to the previous exam. Appearance favors arthropathy with infection less likely given the lack of a joint effusion. Underlying gout is a distinct possibility given the medial erosions. 5. Flattened/collapse head of the third metatarsal with arthropathy at the third MTP joint. 6. Worsening dorsal subcutaneous edema in the forefoot, cellulitis is not excluded. Diffuse subcutaneous edema along the ball of the foot. 7. Regional muscular edema likely neuropathic. Electronically Signed   By: Gaylyn Rong M.D.   On: 04/11/2023 13:25   ECHOCARDIOGRAM LIMITED Result Date: 04/11/2023    ECHOCARDIOGRAM LIMITED REPORT   Patient Name:   Kim Burns Date of Exam: 04/11/2023 Medical Rec #:  [id number]        Height:       63.0 in Accession #:    [id number]       Weight:       112.4 lb Date of Birth:  16-Jun-1935        BSA:          1.514 m Patient Age:    87 years         BP:           98/58 mmHg Patient Gender: F                HR:           86 bpm. Exam Location:  Inpatient Procedure: Limited Echo, Limited Color Doppler and Color Doppler (Both Spectral            and Color Flow Doppler were utilized during procedure). Indications:    CHF                 Sepsis  History:        Patient has prior history of Echocardiogram examinations, most                 recent 01/30/2023. CHF, CAD, Arrythmias:Atrial  Fibrillation;                 Risk Factors:Diabetes.  Sonographer:    Amy Chionchio Referring Phys: [id number] GRACE E BOWSER IMPRESSIONS  1. Left ventricular ejection fraction, by estimation, is 55 to 60%. The left ventricle has normal function. The left ventricle has no regional wall motion abnormalities. Left ventricular diastolic parameters are indeterminate. There is the interventricular septum is flattened in systole and diastole, consistent with right ventricular pressure and volume overload.  2. Right ventricular systolic function is moderately reduced. The right ventricular size is mildly enlarged. There is severely elevated pulmonary artery systolic pressure.  3. Left atrial size was moderately dilated.  4. Right atrial size was mildly dilated.  5. The mitral valve is normal in structure. Mild mitral valve regurgitation. No evidence of mitral stenosis. Severe mitral annular calcification.  6. Tricuspid valve regurgitation is moderate.  7. The aortic valve is tricuspid. Aortic valve regurgitation is not visualized. Aortic valve sclerosis is present, with no evidence of aortic valve stenosis.  8. The inferior vena cava is dilated in size with <50%  respiratory variability, suggesting right atrial pressure of 15 mmHg. FINDINGS  Left Ventricle: Left ventricular ejection fraction, by estimation, is 55 to 60%. The left ventricle has normal function. The left ventricle has no regional wall motion abnormalities. Strain imaging was not performed. The left ventricular internal cavity  size was normal in size. There is no left ventricular hypertrophy. The interventricular septum is flattened in systole and diastole, consistent with right ventricular pressure and volume overload. Left ventricular diastolic parameters are indeterminate. Right Ventricle: The right ventricular size is mildly enlarged. Right ventricular systolic function is moderately reduced. There is severely elevated pulmonary artery systolic pressure. The  tricuspid regurgitant velocity is 3.44 m/s, and with an assumed right atrial pressure of 15 mmHg, the estimated right ventricular systolic pressure is 62.3 mmHg. Left Atrium: Left atrial size was moderately dilated. Right Atrium: Right atrial size was mildly dilated. Pericardium: There is no evidence of pericardial effusion. Mitral Valve: The mitral valve is normal in structure. Severe mitral annular calcification. Mild mitral valve regurgitation. No evidence of mitral valve stenosis. MV peak gradient, 5.5 mmHg. The mean mitral valve gradient is 1.0 mmHg. Tricuspid Valve: The tricuspid valve is normal in structure. Tricuspid valve regurgitation is moderate . No evidence of tricuspid stenosis. Aortic Valve: The aortic valve is tricuspid. Aortic valve regurgitation is not visualized. Aortic valve sclerosis is present, with no evidence of aortic valve stenosis. Pulmonic Valve: The pulmonic valve was normal in structure. Pulmonic valve regurgitation is trivial. No evidence of pulmonic stenosis. Aorta: The aortic root is normal in size and structure. Venous: The inferior vena cava is dilated in size with less than 50% respiratory variability, suggesting right atrial pressure of 15 mmHg. IAS/Shunts: No atrial level shunt detected by color flow Doppler. Additional Comments: 3D imaging was not performed.  LEFT VENTRICLE PLAX 2D LVOT diam:     1.90 cm LV SV:         32 LV SV Index:   21 LVOT Area:     2.84 cm  LV Volumes (MOD) LV vol d, MOD A2C: 60.2 ml LV vol d, MOD A4C: 50.8 ml LV vol s, MOD A2C: 30.6 ml LV vol s, MOD A4C: 23.8 ml LV SV MOD A2C:     29.6 ml LV SV MOD A4C:     50.8 ml LV SV MOD BP:      28.9 ml RIGHT VENTRICLE          IVC RV Basal diam:  3.40 cm  IVC diam: 2.40 cm RV Mid diam:    2.60 cm TAPSE (M-mode): 0.5 cm RIGHT ATRIUM           Index RA Area:     18.80 cm RA Volume:   51.20 ml  33.82 ml/m  AORTIC VALVE LVOT Vmax:   63.90 cm/s LVOT Vmean:  46.700 cm/s LVOT VTI:    0.113 m MITRAL VALVE              TRICUSPID VALVE MV Area VTI:  1.32 cm   TR Peak grad:   47.3 mmHg MV Peak grad: 5.5 mmHg   TR Vmax:        344.00 cm/s MV Mean grad: 1.0 mmHg MV Vmax:      1.17 m/s   SHUNTS MV Vmean:     49.1 cm/s  Systemic VTI:  0.11 m  Systemic Diam: 1.90 cm Olga Millers MD Electronically signed by Olga Millers MD Signature Date/Time: 04/11/2023/12:22:46 PM    Final    DG CHEST PORT 1 VIEW Result Date: 04/11/2023 CLINICAL DATA:  Hypoxia EXAM: PORTABLE CHEST 1 VIEW COMPARISON:  04/10/2023 FINDINGS: Right dialysis catheter remains in place, unchanged. Heart is borderline in size. Mediastinal contours within normal limits. Aortic atherosclerosis. Increasing left basilar opacity. Right lung clear. No effusions or edema. No acute bony abnormality. IMPRESSION: Increasing left basilar atelectasis or infiltrate. Electronically Signed   By: Charlett Nose M.D.   On: 04/11/2023 03:03   DG Foot Complete Left Result Date: 04/10/2023 CLINICAL DATA:  Shortness of breath.  Bilateral toe pain. EXAM: LEFT FOOT - COMPLETE 3+ VIEW COMPARISON:  03/26/2023. FINDINGS: No acute fracture or dislocation. No aggressive osseous lesion. Mild-to-moderate hallux valgus deformity. Redemonstration of metallic K-wire/screw overlying the distal first and second metatarsals. Mild diffuse degenerative changes of imaged joints. There is flattening and fragmentation of the head of the third metatarsal, likely sequela of prior trauma versus avascular necrosis. No focal soft tissue swelling. No focal soft tissue defect or air within the soft tissue. No radiopaque foreign bodies. IMPRESSION: *No acute osseous abnormality of the left foot. Correlate clinically to determine the need for additional imaging with MRI. Electronically Signed   By: Jules Schick M.D.   On: 04/10/2023 18:45   DG Chest 2 View Result Date: 04/10/2023 CLINICAL DATA:  Shortness of breath. EXAM: CHEST - 2 VIEW COMPARISON:  03/26/2023. FINDINGS: Low lung volume.  Bilateral lung fields are clear. Bilateral costophrenic angles are clear. Normal cardio-mediastinal silhouette. No acute osseous abnormalities. The soft tissues are within normal limits. Right sided central venous catheter noted with its tip overlying the right atrium, unchanged. IMPRESSION: No active cardiopulmonary disease. Electronically Signed   By: Jules Schick M.D.   On: 04/10/2023 18:39   - pertinent xrays, CT, MRI studies were reviewed and independently interpreted  Positive ROS: All other systems have been reviewed and were otherwise negative with the exception of those mentioned in the HPI and as above.  Physical Exam: General: Alert, no acute distress Psychiatric: Patient is competent for consent with normal mood and affect Lymphatic: No axillary or cervical lymphadenopathy Cardiovascular: No pedal edema Respiratory: No cyanosis, no use of accessory musculature GI: No organomegaly, abdomen is soft and non-tender    Images:  @ENCIMAGES @  Labs:  Lab Results  Component Value Date   HGBA1C 5.4 03/27/2023   HGBA1C 6.1 (H) 10/27/2022   HGBA1C 5.8 (H) 09/14/2022   ESRSEDRATE 3 04/11/2023   ESRSEDRATE 85 (H) 03/27/2023   ESRSEDRATE 33 (H) 03/05/2019   CRP <0.5 04/11/2023   CRP 6.2 (H) 03/27/2023   CRP 0.9 03/05/2019   LABURIC 6.1 03/28/2023   REPTSTATUS 04/01/2023 FINAL 03/26/2023   GRAMSTAIN  11/02/2021    ABUNDANT WBC PRESENT,BOTH PMN AND MONONUCLEAR NO ORGANISMS SEEN    CULT  03/26/2023    NO GROWTH 5 DAYS Performed at [facility], [address], [place], [place] [address]     Lab Results  Component Value Date   ALBUMIN 2.3 (L) 04/11/2023   ALBUMIN 2.5 (L) 04/10/2023   ALBUMIN 2.1 (L) 03/27/2023   LABURIC 6.1 03/28/2023        Latest Ref Rng & Units 04/11/2023    8:50 PM 04/11/2023   10:02 AM 04/11/2023    6:07 AM  CBC EXTENDED  WBC 4.0 - 10.5 K/uL 16.4     RBC 3.87 -  5.11 MIL/uL 3.93     Hemoglobin 12.0 - 15.0 g/dL 16.1  09.6  04.5   HCT  36.0 - 46.0 % 37.2  38.0  38.0   Platelets 150 - 400 K/uL 235       Neurologic: Patient does not have protective sensation bilateral lower extremities.   MUSCULOSKELETAL:   Skin: Examination patient has thin atrophic skin for both lower extremities.  She does not have a palpable pulse on either foot.  No ulcerations on the right foot.  Left foot second toe medial border of the PIP joint has a chronic ulcer over the PIP joint.  Review of the MRI scan shows osteomyelitis at the PIP joint proximal phalanx.  There is also early edematous changes across the metatarsal heads.  There is no abscess.  White cell count 16.4, albumin 2.3 sed rate 3 with a C-reactive protein less than 0.5.  Assessment: Assessment: Diabetic insensate neuropathy with end-stage renal disease on dialysis with ulceration and osteomyelitis involving the left foot second toe and early edema across the metatarsal heads left foot.  C-reactive protein and sed rate are within normal limits.  Plan: Plan: Would continue observation.  Dry dressing changes to the left foot second toe change daily.  I will follow-up in the office as an outpatient.  Patient does not need antibiotics and with her peripheral vascular disease and end-stage renal disease, she is not a candidate for surgical intervention through the left forefoot.    Thank you for the consult and the opportunity to see Ms. Chauncey Reading, MD [facility] [phone] 9:00 AM

## 2023-04-12 NOTE — Progress Notes (Signed)
   04/12/23 2146  Provider Notification  Provider Name/Title Dellia Cloud, MD  Date Provider Notified 04/12/23  Time Provider Notified 2146  Method of Notification Page (secure chat)  Notification Reason Red med refusal (refused heparin)   Patient explained and educated about the importance of taking heparin.

## 2023-04-12 NOTE — Evaluation (Signed)
 Physical Therapy Evaluation Patient Details Name: Kim Burns MRN: [id number] DOB: 1935-07-19 Today's Date: 04/12/2023  History of Present Illness  88 y.o. female presents to ED 2/20 secondary to severe sepsis, osteomyelitis and lower extremity cellulitis, complicated by recent influenza infection, hypotensive, developed respiratory failure in the setting of volume overload, requiring BiPAP support, significant improvement of respiratory status after 2 L ultrafiltration hemodialysis, significantly improved with antibiotics. PMH - afib, ESRD on HD, chf, DM, HTN, Whipple, gout, bradycardia, TIA, CVA.  Clinical Impression  Pt admitted with above diagnosis. Evaluated previously by our services 2/07 during recent admission and required mod assist for transfer, CGA to ambulate with RW in hallway. Today pt able to perform bed mobility and transfer with min assist. Deferred gait due to lethargy and poor readings with SpO2 and BP. Once transferred to recliner and pt more alert, BP 121/73 HR 101 and Spo2 96% on 4L. Noted some twitching of face and all 4 extremities - RN notified; pt states this has been ongoing for about 1 month. She provides somewhat of a questionable history that veers from details presented during our recent evaluation on 2/7. It appears she has 24/7 assist at discharge. Suspect she will progress to HHPT level with family support. Using Rollator at baseline for short distances. Will progress as tolerated. Pt currently with functional limitations due to the deficits listed below (see PT Problem List). Pt will benefit from acute skilled PT to increase their independence and safety with mobility to allow discharge.           If plan is discharge home, recommend the following: A little help with walking and/or transfers;A little help with bathing/dressing/bathroom;Assist for transportation;Help with stairs or ramp for entrance;Assistance with cooking/housework   Can travel by private  vehicle        Equipment Recommendations None recommended by PT  Recommendations for Other Services       Functional Status Assessment Patient has had a recent decline in their functional status and demonstrates the ability to make significant improvements in function in a reasonable and predictable amount of time.     Precautions / Restrictions Precautions Precautions: Fall Precaution/Restrictions Comments: Lt nephrostomy drain Required Braces or Orthoses: Other Brace Other Brace: L post-op shoe (Not found in room) Restrictions Weight Bearing Restrictions Per Provider Order: Yes (From prior visit, no new orders) LLE Weight Bearing Per Provider Order: Weight bearing as tolerated (From prior visit, no new orders)      Mobility  Bed Mobility Overal bed mobility: Needs Assistance Bed Mobility: Rolling, Sidelying to Sit Rolling: Min assist, Used rails Sidelying to sit: Min assist, Used rails       General bed mobility comments: Min assist to roll towards Rt side, using rail, guided hand able to maintain grasp once placed. Min assist for trunk and LE support to rise to EOB. Cues for technique througout.    Transfers Overall transfer level: Needs assistance Equipment used: 1 person hand held assist Transfers: Sit to/from Stand, Bed to chair/wheelchair/BSC Sit to Stand: Min assist           General transfer comment: Min assist for boost, cues to facilitate rise from bed, bil hand held support. able to step around to chair from bed without buckling, good control with descent into chair.    Ambulation/Gait               General Gait Details: Deferred, HFNC, labile BP, poor O2 pleth, lethargic.  Stairs  Wheelchair Mobility     Tilt Bed    Modified Rankin (Stroke Patients Only)       Balance Overall balance assessment: Needs assistance, History of Falls Sitting-balance support: No upper extremity supported, Feet supported Sitting  balance-Leahy Scale: Good     Standing balance support: During functional activity, Single extremity supported Standing balance-Leahy Scale: Poor Standing balance comment: UE support to stabilize                             Pertinent Vitals/Pain Pain Assessment Pain Assessment: No/denies pain    Home Living Family/patient expects to be discharged to:: Private residence Living Arrangements: Children Available Help at Discharge: Family;Available 24 hours/day Type of Home: House Home Access: Ramped entrance       Home Layout: Able to live on main level with bedroom/bathroom;Laundry or work area in [employer] Equipment: Agricultural consultant (2 wheels);Shower seat;Rollator (4 wheels);Wheelchair - manual;Cane - single point;Tub bench Additional Comments: Information obtained from most recent visit earlier this month. Pt currently providing alternate information that does not seem accurate. Will need to confirm with family; "Has 24/7 assist from either son or daughter. Children switch providing assistance"    Prior Function Prior Level of Function : Independent/Modified Independent;History of Falls (last six months)             Mobility Comments: uses rollator or RW, family is close by when pt is ambulating ADLs Comments: mod I     Extremity/Trunk Assessment   Upper Extremity Assessment Upper Extremity Assessment: Defer to OT evaluation    Lower Extremity Assessment Lower Extremity Assessment: Generalized weakness;Difficult to assess due to impaired cognition    Cervical / Trunk Assessment Cervical / Trunk Assessment: Kyphotic  Communication   Communication Communication: Impaired Factors Affecting Communication: Hearing impaired    Cognition Arousal: Lethargic Behavior During Therapy: WFL for tasks assessed/performed   PT - Cognitive impairments: No family/caregiver present to determine baseline, Orientation, Memory, Attention, Initiation, Sequencing,  Problem solving   Orientation impairments: Time (correct year but incorrect month stated.)                   PT - Cognition Comments: Some difficulties may be due to hearing impairment and keeping her eyes closed majority of visit. Following commands: Impaired Following commands impaired: Only follows one step commands consistently, Follows one step commands with increased time     Cueing Cueing Techniques: Verbal cues     General Comments General comments (skin integrity, edema, etc.): BP and O2 with large fluctuations - unsure of accuracy. While seated in chair and demonstrating good waveform and pt still - the best readings obtained were Spo2 96% on 4L HFNC; and BP 121/73 with HR 101.  Noted facial, UE and LE, twitching - RN notified; pt states this has been ongoing for about 1 month.    Exercises General Exercises - Lower Extremity Ankle Circles/Pumps: AROM, Both, 10 reps, Supine   Assessment/Plan    PT Assessment Patient needs continued PT services  PT Problem List Decreased strength;Decreased activity tolerance;Decreased balance;Decreased mobility;Decreased coordination;Decreased range of motion;Decreased cognition;Decreased knowledge of use of DME;Decreased safety awareness;Decreased knowledge of precautions;Cardiopulmonary status limiting activity;Impaired sensation;Decreased skin integrity       PT Treatment Interventions DME instruction;Gait training;Functional mobility training;Therapeutic activities;Therapeutic exercise;Stair training;Balance training;Neuromuscular re-education;Patient/family education;Cognitive remediation    PT Goals (Current goals can be found in the Care Plan section)  Acute Rehab PT Goals Patient Stated Goal:  to go home PT Goal Formulation: With patient Time For Goal Achievement: 05/02/23 Potential to Achieve Goals: Fair    Frequency Min 1X/week     Co-evaluation               AM-PAC PT "6 Clicks" Mobility  Outcome Measure Help  needed turning from your back to your side while in a flat bed without using bedrails?: A Little Help needed moving from lying on your back to sitting on the side of a flat bed without using bedrails?: A Little Help needed moving to and from a bed to a chair (including a wheelchair)?: A Little Help needed standing up from a chair using your arms (e.g., wheelchair or bedside chair)?: A Little Help needed to walk in hospital room?: A Lot Help needed climbing 3-5 steps with a railing? : A Lot 6 Click Score: 16    End of Session Equipment Utilized During Treatment: Gait belt;Oxygen Activity Tolerance: Patient tolerated treatment well Patient left: with call bell/phone within reach;in chair;with chair alarm set Nurse Communication: Mobility status (facial, UE and LE twitching intermittently during session. O2 and BP with large fluctuations but appears stable when readings more consistent.) PT Visit Diagnosis: Muscle weakness (generalized) (M62.81);Unsteadiness on feet (R26.81);History of falling (Z91.81);Difficulty in walking, not elsewhere classified (R26.2)    Time: 1610-9604 PT Time Calculation (min) (ACUTE ONLY): 27 min   Charges:   PT Evaluation $PT Eval Moderate Complexity: 1 Mod PT Treatments $Therapeutic Activity: 8-22 mins PT General Charges $$ ACUTE PT VISIT: 1 Visit         Kathlyn Sacramento, PT, DPT [facility]  Rehabilitation Services Physical Therapist Office: [phone] Website: [email or web address]   Berton Mount 04/12/2023, 4:09 PM

## 2023-04-12 NOTE — Progress Notes (Signed)
 [facility] Progress Note   Subjective:    Seen and examined patient at bedside. Tolerated yesterday's HD with net UF 2L. Off Bipap and currently on HFNC 8L. Seen by Dr. Lajoyce Corners earlier for ulceration left foot.  Objective Vitals:   04/12/23 0200 04/12/23 0410 04/12/23 0600 04/12/23 0800  BP: (!) 113/92 (!) 143/94 (!) 81/37 119/73  Pulse: 81 88 (!) 127 (!) 114  Resp: 20 18  18   Temp:  97.9 F (36.6 C)  98 F (36.7 C)  TempSrc:  Oral  Oral  SpO2: 100% 98% 96% 99%   Physical Exam General: Elderly woman; chronically ill-appearing; NAD HEENT: (+) JVD Heart: S1 and S2; No murmurs, gallops, or rubs Lungs: Fine crackles left lower-upper lobes; No wheezing or rhonchi Abdomen: Soft and non-tender Extremities:Erythema noted bilateral lower extremities, no edema Dialysis Access: R TDC; L AVF   There were no vitals filed for this visit.  Intake/Output Summary (Last 24 hours) at 04/12/2023 1011 Last data filed at 04/11/2023 2000 Gross per 24 hour  Intake --  Output 15 ml  Net -15 ml    Additional Objective Labs: Basic Metabolic Panel: Recent Labs  Lab 04/10/23 1826 04/10/23 2035 04/11/23 0515 04/11/23 0607 04/11/23 1002  NA 139   < > 139 139 138  K 3.7   < > 4.1 4.0 4.1  CL 98  --  99  --   --   CO2 21*  --  22  --   --   GLUCOSE 208*  --  107*  --   --   BUN 34*  --  35*  --   --   CREATININE 3.83*  --  3.88*  --   --   CALCIUM 8.1*  --  8.1*  --   --   PHOS  --   --  7.6*  --   --    < > = values in this interval not displayed.   Liver Function Tests: Recent Labs  Lab 04/10/23 1826 04/11/23 0515  AST 36 24  ALT 31 29  ALKPHOS 65 61  BILITOT 1.3* 1.2  PROT 5.9* 5.4*  ALBUMIN 2.5* 2.3*   No results for input(s): "LIPASE", "AMYLASE" in the last 168 hours. CBC: Recent Labs  Lab 04/10/23 1826 04/10/23 2035 04/11/23 0515 04/11/23 0607 04/11/23 1002 04/11/23 2050  WBC 13.3*  --  14.6*  --   --  16.4*  NEUTROABS 11.8*  --   --   --   --   --    HGB 11.0*   < > 10.8* 12.9 12.9 11.1*  HCT 37.6   < > 38.2 38.0 38.0 37.2  MCV 97.2  --  100.5*  --   --  94.7  PLT 198  --  200  --   --  235   < > = values in this interval not displayed.   Blood Culture    Component Value Date/Time   SDES BLOOD RIGHT HAND 04/11/2023 2352   SPECREQUEST  04/11/2023 2352    BOTTLES DRAWN AEROBIC ONLY Blood Culture results may not be optimal due to an inadequate volume of blood received in culture bottles   CULT  04/11/2023 2352    NO GROWTH < 12 HOURS Performed at [facility], [address] [place], [place] [address]    REPTSTATUS PENDING 04/11/2023 2352    Cardiac Enzymes: No results for input(s): "CKTOTAL", "CKMB", "CKMBINDEX", "TROPONINI" in the last 168 hours. CBG: Recent  Labs  Lab 04/11/23 2113 04/11/23 2120 04/11/23 2144 04/12/23 0133 04/12/23 0846  GLUCAP 17* <10* 141* 164* 108*   Iron Studies: No results for input(s): "IRON", "TIBC", "TRANSFERRIN", "FERRITIN" in the last 72 hours. Lab Results  Component Value Date   INR 1.1 03/26/2023   INR 1.0 03/14/2022   INR 1.4 (H) 01/22/2022   Studies/Results: MR FOOT LEFT WO CONTRAST Result Date: 04/11/2023 CLINICAL DATA:  Left second toe infection. EXAM: MRI OF THE LEFT FOOT WITHOUT CONTRAST TECHNIQUE: Multiplanar, multisequence MR imaging of the left forefoot was performed. No intravenous contrast was administered. COMPARISON:  03/27/2023 and radiographs from 04/10/2023 FINDINGS: Bones/Joint/Cartilage Progressive abnormal edema distally in the proximal phalanx second toe compatible with acute osteomyelitis. There is also accentuated T2 signal in the fused proximal and middle phalanges of the small toe suggesting osteomyelitis. Low-level marrow edema in the tuft of the distal phalanx third toe potentially from osteomyelitis. Postoperative findings in the first metatarsal with medial erosions along the first metatarsal head and nonspecific marrow edema dorsally along the first  metatarsal head dorsally along the base of the proximal phalanx great toe similar to the previous exam. Appearance favors arthropathy with infection less likely given the lack of a joint effusion. Underlying gout is a distinct possibility given the medial erosions. Flattened/collapse head of the third metatarsal with arthropathy at the third MTP joint. Metal artifact distally in the second metatarsal compatible with prior operative intervention. Ligaments Lisfranc ligament intact. Muscles and Tendons Regional muscular edema likely neuropathic. Soft tissues Worsening dorsal subcutaneous edema in the forefoot, cellulitis is not excluded. Diffuse subcutaneous edema along the ball of the foot. Edema tracks into all of the toes. IMPRESSION: 1. Progressive abnormal edema distally in the proximal phalanx second toe compatible with acute osteomyelitis. 2. Accentuated T2 signal in the fused proximal and middle phalanges of the small toe suggesting osteomyelitis. 3. Low-level marrow edema in the tuft of the distal phalanx third toe potentially from osteomyelitis. 4. Postoperative findings in the first metatarsal with medial erosions along the first metatarsal head and nonspecific marrow edema dorsally along the first metatarsal head and dorsally along the base of the proximal phalanx great toe similar to the previous exam. Appearance favors arthropathy with infection less likely given the lack of a joint effusion. Underlying gout is a distinct possibility given the medial erosions. 5. Flattened/collapse head of the third metatarsal with arthropathy at the third MTP joint. 6. Worsening dorsal subcutaneous edema in the forefoot, cellulitis is not excluded. Diffuse subcutaneous edema along the ball of the foot. 7. Regional muscular edema likely neuropathic. Electronically Signed   By: Gaylyn Rong M.D.   On: 04/11/2023 13:25   ECHOCARDIOGRAM LIMITED Result Date: 04/11/2023    ECHOCARDIOGRAM LIMITED REPORT   Patient  Name:   Venissa P Harte Date of Exam: 04/11/2023 Medical Rec #:  [id number]        Height:       63.0 in Accession #:    [id number]       Weight:       112.4 lb Date of Birth:  05-Feb-1936        BSA:          1.514 m Patient Age:    87 years         BP:           98/58 mmHg Patient Gender: F                HR:  86 bpm. Exam Location:  Inpatient Procedure: Limited Echo, Limited Color Doppler and Color Doppler (Both Spectral            and Color Flow Doppler were utilized during procedure). Indications:    CHF                 Sepsis  History:        Patient has prior history of Echocardiogram examinations, most                 recent 01/30/2023. CHF, CAD, Arrythmias:Atrial Fibrillation;                 Risk Factors:Diabetes.  Sonographer:    Amy Chionchio Referring Phys: [id number] GRACE E BOWSER IMPRESSIONS  1. Left ventricular ejection fraction, by estimation, is 55 to 60%. The left ventricle has normal function. The left ventricle has no regional wall motion abnormalities. Left ventricular diastolic parameters are indeterminate. There is the interventricular septum is flattened in systole and diastole, consistent with right ventricular pressure and volume overload.  2. Right ventricular systolic function is moderately reduced. The right ventricular size is mildly enlarged. There is severely elevated pulmonary artery systolic pressure.  3. Left atrial size was moderately dilated.  4. Right atrial size was mildly dilated.  5. The mitral valve is normal in structure. Mild mitral valve regurgitation. No evidence of mitral stenosis. Severe mitral annular calcification.  6. Tricuspid valve regurgitation is moderate.  7. The aortic valve is tricuspid. Aortic valve regurgitation is not visualized. Aortic valve sclerosis is present, with no evidence of aortic valve stenosis.  8. The inferior vena cava is dilated in size with <50% respiratory variability, suggesting right atrial pressure of 15 mmHg. FINDINGS  Left  Ventricle: Left ventricular ejection fraction, by estimation, is 55 to 60%. The left ventricle has normal function. The left ventricle has no regional wall motion abnormalities. Strain imaging was not performed. The left ventricular internal cavity  size was normal in size. There is no left ventricular hypertrophy. The interventricular septum is flattened in systole and diastole, consistent with right ventricular pressure and volume overload. Left ventricular diastolic parameters are indeterminate. Right Ventricle: The right ventricular size is mildly enlarged. Right ventricular systolic function is moderately reduced. There is severely elevated pulmonary artery systolic pressure. The tricuspid regurgitant velocity is 3.44 m/s, and with an assumed right atrial pressure of 15 mmHg, the estimated right ventricular systolic pressure is 62.3 mmHg. Left Atrium: Left atrial size was moderately dilated. Right Atrium: Right atrial size was mildly dilated. Pericardium: There is no evidence of pericardial effusion. Mitral Valve: The mitral valve is normal in structure. Severe mitral annular calcification. Mild mitral valve regurgitation. No evidence of mitral valve stenosis. MV peak gradient, 5.5 mmHg. The mean mitral valve gradient is 1.0 mmHg. Tricuspid Valve: The tricuspid valve is normal in structure. Tricuspid valve regurgitation is moderate . No evidence of tricuspid stenosis. Aortic Valve: The aortic valve is tricuspid. Aortic valve regurgitation is not visualized. Aortic valve sclerosis is present, with no evidence of aortic valve stenosis. Pulmonic Valve: The pulmonic valve was normal in structure. Pulmonic valve regurgitation is trivial. No evidence of pulmonic stenosis. Aorta: The aortic root is normal in size and structure. Venous: The inferior vena cava is dilated in size with less than 50% respiratory variability, suggesting right atrial pressure of 15 mmHg. IAS/Shunts: No atrial level shunt detected by color  flow Doppler. Additional Comments: 3D imaging was not performed.  LEFT VENTRICLE PLAX 2D  LVOT diam:     1.90 cm LV SV:         32 LV SV Index:   21 LVOT Area:     2.84 cm  LV Volumes (MOD) LV vol d, MOD A2C: 60.2 ml LV vol d, MOD A4C: 50.8 ml LV vol s, MOD A2C: 30.6 ml LV vol s, MOD A4C: 23.8 ml LV SV MOD A2C:     29.6 ml LV SV MOD A4C:     50.8 ml LV SV MOD BP:      28.9 ml RIGHT VENTRICLE          IVC RV Basal diam:  3.40 cm  IVC diam: 2.40 cm RV Mid diam:    2.60 cm TAPSE (M-mode): 0.5 cm RIGHT ATRIUM           Index RA Area:     18.80 cm RA Volume:   51.20 ml  33.82 ml/m  AORTIC VALVE LVOT Vmax:   63.90 cm/s LVOT Vmean:  46.700 cm/s LVOT VTI:    0.113 m MITRAL VALVE             TRICUSPID VALVE MV Area VTI:  1.32 cm   TR Peak grad:   47.3 mmHg MV Peak grad: 5.5 mmHg   TR Vmax:        344.00 cm/s MV Mean grad: 1.0 mmHg MV Vmax:      1.17 m/s   SHUNTS MV Vmean:     49.1 cm/s  Systemic VTI:  0.11 m                          Systemic Diam: 1.90 cm Olga Millers MD Electronically signed by Olga Millers MD Signature Date/Time: 04/11/2023/12:22:46 PM    Final    DG CHEST PORT 1 VIEW Result Date: 04/11/2023 CLINICAL DATA:  Hypoxia EXAM: PORTABLE CHEST 1 VIEW COMPARISON:  04/10/2023 FINDINGS: Right dialysis catheter remains in place, unchanged. Heart is borderline in size. Mediastinal contours within normal limits. Aortic atherosclerosis. Increasing left basilar opacity. Right lung clear. No effusions or edema. No acute bony abnormality. IMPRESSION: Increasing left basilar atelectasis or infiltrate. Electronically Signed   By: Charlett Nose M.D.   On: 04/11/2023 03:03   DG Foot Complete Left Result Date: 04/10/2023 CLINICAL DATA:  Shortness of breath.  Bilateral toe pain. EXAM: LEFT FOOT - COMPLETE 3+ VIEW COMPARISON:  03/26/2023. FINDINGS: No acute fracture or dislocation. No aggressive osseous lesion. Mild-to-moderate hallux valgus deformity. Redemonstration of metallic K-wire/screw overlying the distal first  and second metatarsals. Mild diffuse degenerative changes of imaged joints. There is flattening and fragmentation of the head of the third metatarsal, likely sequela of prior trauma versus avascular necrosis. No focal soft tissue swelling. No focal soft tissue defect or air within the soft tissue. No radiopaque foreign bodies. IMPRESSION: *No acute osseous abnormality of the left foot. Correlate clinically to determine the need for additional imaging with MRI. Electronically Signed   By: Jules Schick M.D.   On: 04/10/2023 18:45   DG Chest 2 View Result Date: 04/10/2023 CLINICAL DATA:  Shortness of breath. EXAM: CHEST - 2 VIEW COMPARISON:  03/26/2023. FINDINGS: Low lung volume. Bilateral lung fields are clear. Bilateral costophrenic angles are clear. Normal cardio-mediastinal silhouette. No acute osseous abnormalities. The soft tissues are within normal limits. Right sided central venous catheter noted with its tip overlying the right atrium, unchanged. IMPRESSION: No active cardiopulmonary disease. Electronically Signed   By: Rhea Belton  Ramiro Harvest M.D.   On: 04/10/2023 18:39    Medications:  albumin human 25 g (04/11/23 1627)   ceFEPime (MAXIPIME) IV 1 g (04/12/23 0113)   dextrose 5% lactated ringers 30 mL/hr at 04/11/23 2135   heparin 1,050 Units/hr (04/11/23 2311)   vancomycin      amiodarone  200 mg Oral Daily   Chlorhexidine Gluconate Cloth  6 each Topical Q0600   doxercalciferol  3 mcg Intravenous Q M,W,F-HD   insulin aspart  0-6 Units Subcutaneous TID WC   lipase/protease/amylase  36,000 Units Oral TID WC   midodrine  10 mg Oral TID WC   multivitamin  1 tablet Oral QHS   [START ON 04/13/2023] oseltamivir  30 mg Oral Q48H   oseltamivir  30 mg Oral Once   sevelamer carbonate  800 mg Oral TID WC    Dialysis Orders: [facility] MWF 3.5h   B350   50.5kg   2K bath   TDC+  AVF (using TDC) Heparin none - last OP HD 2/19, post wt 49.6kg  - getting to dry wt, sometimes under, very low IDWG's - hectorol 3  mcg - venofer 50 weekly - mircera 225 mcg q2, last 2/17, due 3/03  Assessment/Plan: Sepsis - probable LLE cellulitis, started on IV abx w/ cefepime and vanc. Poss pna  Acute hypoxic / hypercarbic resp failure - recent flu A, LLL opacity on CXR. Seen by pulm for consult. Sending RVP/ covid / flu, blood cx's. Prn bipap. IV abx for poss pna Left foot ulcer - Seen by Dr. Lajoyce Corners today, per his note, watch for now. No need for ABXs and not a surgical candidate. Plan to f/u in outpatient. ESRD - on HD MWF. Received HD yesterday 2L removed. Next HD 2/24. BP - on midodrine 10 tid at home, continue here. BP's low here 100- 110s systolic.  Volume - no vol overload, may be losing body wt. Mild calf edema. UF 1-2 L as tol Anemia of esrd - Hb 10- 12 here, no esa needs.  Secondary hyperparathyroidism - CCa in range, phos a bit high. Cont binder w/ meals and IV vdra.  RCC - s/p ablation c/b urine leak s/p neph tube   Salome Holmes, NP [facility] 04/12/2023,10:11 AM  LOS: 2 days

## 2023-04-12 NOTE — Plan of Care (Signed)

## 2023-04-12 NOTE — Progress Notes (Signed)
 Patient had a critical lactic acid of 2.9 called at 0110. On call provider notified at this time. Awaiting response.

## 2023-04-12 NOTE — Plan of Care (Signed)

## 2023-04-13 DIAGNOSIS — N186 End stage renal disease: Secondary | ICD-10-CM | POA: Diagnosis not present

## 2023-04-13 DIAGNOSIS — A419 Sepsis, unspecified organism: Secondary | ICD-10-CM | POA: Diagnosis not present

## 2023-04-13 DIAGNOSIS — I480 Paroxysmal atrial fibrillation: Secondary | ICD-10-CM | POA: Diagnosis not present

## 2023-04-13 DIAGNOSIS — M8610 Other acute osteomyelitis, unspecified site: Secondary | ICD-10-CM | POA: Diagnosis not present

## 2023-04-13 LAB — RENAL FUNCTION PANEL
Albumin: 3.1 g/dL — ABNORMAL LOW (ref 3.5–5.0)
Anion gap: 20 — ABNORMAL HIGH (ref 5–15)
BUN: 34 mg/dL — ABNORMAL HIGH (ref 8–23)
CO2: 23 mmol/L (ref 22–32)
Calcium: 9.9 mg/dL (ref 8.9–10.3)
Chloride: 92 mmol/L — ABNORMAL LOW (ref 98–111)
Creatinine, Ser: 3.91 mg/dL — ABNORMAL HIGH (ref 0.44–1.00)
GFR, Estimated: 11 mL/min — ABNORMAL LOW (ref >60)
Glucose, Bld: 115 mg/dL — ABNORMAL HIGH (ref 70–99)
Phosphorus: 7.7 mg/dL — ABNORMAL HIGH (ref 2.5–4.6)
Potassium: 4.5 mmol/L (ref 3.5–5.1)
Sodium: 135 mmol/L (ref 135–145)

## 2023-04-13 LAB — CBC
HCT: 36.6 % (ref 36.0–46.0)
Hemoglobin: 10.7 g/dL — ABNORMAL LOW (ref 12.0–15.0)
MCH: 28 pg (ref 26.0–34.0)
MCHC: 29.2 g/dL — ABNORMAL LOW (ref 30.0–36.0)
MCV: 95.8 fL (ref 80.0–100.0)
Platelets: 192 10*3/uL (ref 150–400)
RBC: 3.82 MIL/uL — ABNORMAL LOW (ref 3.87–5.11)
RDW: 21.6 % — ABNORMAL HIGH (ref 11.5–15.5)
WBC: 12.8 10*3/uL — ABNORMAL HIGH (ref 4.0–10.5)
nRBC: 1.4 % — ABNORMAL HIGH (ref 0.0–0.2)

## 2023-04-13 LAB — GLUCOSE, CAPILLARY
Glucose-Capillary: 133 mg/dL — ABNORMAL HIGH (ref 70–99)
Glucose-Capillary: 293 mg/dL — ABNORMAL HIGH (ref 70–99)
Glucose-Capillary: 216 mg/dL — ABNORMAL HIGH (ref 70–99)
Glucose-Capillary: 221 mg/dL — ABNORMAL HIGH (ref 70–99)

## 2023-04-13 MED ORDER — CHLORHEXIDINE GLUCONATE CLOTH 2 % EX PADS
6.0000 | MEDICATED_PAD | Freq: Every day | CUTANEOUS | Status: DC
  Administered 2023-04-14 – 2023-04-19 (×6): 6 via TOPICAL

## 2023-04-13 MED ORDER — SODIUM CHLORIDE 0.9 % IV SOLN
2.0000 g | INTRAVENOUS | Status: DC
  Administered 2023-04-13 – 2023-04-16 (×4): 2 g via INTRAVENOUS
  Filled 2023-04-13 (×4): qty 20

## 2023-04-13 MED ORDER — SODIUM CHLORIDE 0.9 % IV SOLN
100.0000 mg | Freq: Two times a day (BID) | INTRAVENOUS | Status: DC
  Administered 2023-04-13 – 2023-04-16 (×8): 100 mg via INTRAVENOUS
  Filled 2023-04-13 (×11): qty 100

## 2023-04-13 NOTE — Plan of Care (Signed)
   Problem: Coping: Goal: Ability to adjust to condition or change in health will improve Outcome: Progressing   Problem: Health Behavior/Discharge Planning: Goal: Ability to manage health-related needs will improve Outcome: Progressing   Problem: Metabolic: Goal: Ability to maintain appropriate glucose levels will improve Outcome: Progressing   Problem: Nutritional: Goal: Maintenance of adequate nutrition will improve Outcome: Progressing

## 2023-04-13 NOTE — Progress Notes (Signed)
 PROGRESS NOTE    Kim Burns  ZOX:[id number] DOB: 12-Oct-1935 DOA: 04/10/2023 PCP: Lewis Moccasin, MD    Chief Complaint  Patient presents with   Shortness of Breath    Brief Narrative:   88 year old chronically ill-appearing Caucasian female with a past medical history significant for end-stage renal disease on hemodialysis Monday Wednesday Friday, and intraductal papillary mucinous neoplasm of the pancreas status post Whipple procedure in 2018, persistent atrial fibrillation not on anticoagulation, hypertension, diabetes mellitus type 2, chronic diastolic CHF, anemia chronic disease . Presents to ED secondary to severe sepsis, osteomyelitis and lower extremity cellulitis, complicated by recent influenza infection, hypotensive, required significant IV fluid resuscitation, then she developed respiratory failure in the setting of volume overload, requiring BiPAP support, significant improvement of respiratory status after 2 L ultrafiltration hemodialysis, significantly improved with antibiotics.   Assessment & Plan:   Principal Problem:   Generalized weakness Active Problems:   Severe sepsis (HCC)   ESRD (end stage renal disease) (HCC)   Lactic acidosis   Paroxysmal atrial fibrillation (HCC)   DM2 (diabetes mellitus, type 2) (HCC)   Chronic diastolic CHF (congestive heart failure) (HCC)   History of anemia due to chronic kidney disease   Elevated troponin   Acute osteomyelitis (HCC)   Acute on chronic respiratory failure with hypoxia and hypercapnia (HCC)   Osteomyelitis of second toe of left foot (HCC)   Severe sepsis, present on admission secondary to left lower foot cellulitis with second toe osteomyelitis as well influenza infection with pneumonia, concern for community-acquired pneumonia -sepsis present on admission -No further IV fluid given pulmonary edema and respiratory failure -She was on doxycycline at home -Broad-spectrum antibiotic, including vancomycin,  cefepime. -Patient is improving, will narrow antibiotics, will change to IV Bactrim and Rocephin. -Blood cultures remain negative -MRI foot significant for second great toe osteomyelitis, Dr. Lajoyce Corners input greatly appreciated, continue with local wound care, and narrow antibiotics with recommendation for outpatient follow-up  Acute metabolic encephalopathy secondary to hypercarbia and sepsis -Patient much improved, back to baseline   Acute respiratory failure with hypercarbia and hypoxia Patient influenza A pneumonia and left lower lobe opacity Acute on chronic diastolic CHF -Required BiPAP support, respiratory status much improved, 4 L nasal cannula this morning, will continue to wean as tolerated, encouraged use incentive spirometry and flutter valve -When severe sepsis she required ample IV fluid resuscitation -Much improved after hemodialysis  -Continue with BiPAP as needed basis only -Volume management with HD -Continue with IV antibiotic for bacterial pneumonia -Continue with Tamiflu for influenza B pneumonia  Hypotension -Continue with midodrine, as well can be related to sepsis  Elevated troponins History of CAD Paroxysmal A-fib not on chronic anticoagulation due to history of GI bleed -Troponins elevated on admission, due to demand ischemia in the setting of sepsis, hypoxia and ESRD -Initial heparin GTT, no indication for anticoagulation per cardiology on admission, discontinued this patient with her significant easy bruising and history of GI bleed -Not a candidate for anticoagulation given history of GI bleed   Anemia of end-stage renal disease -IV iron and Procrit  per renal as needed  Diabetes mellitus, type II, uncontrolled with hypoglycemia -Most recent A1c 5.4  -She is with hypoglycemia yesterday requiring D5 drip, will decrease drip rate and insulin sliding scale  History of intraductal papillary mucinous neoplasm of the pancreas status post Whipple -With Creon  with meals once diet is advanced  Renal cell carcinoma -Status post ablation, complicated by urine leak, status post nephrostomy tube  ESRD, on hemodialysis -Renal consulted, Monday Wednesday Friday schedule   DVT prophylaxis: SCD, heparin drip has been discontinued. Code Status: Full Family Communication: none at bedside, discussed with son by phone Disposition:   Status is: Inpatient    Consultants:  Renal Ortho Dr Lajoyce Corners PCCM   Subjective:  No significant events overnight as discussed with staff  Objective: Vitals:   04/13/23 0200 04/13/23 0400 04/13/23 0451 04/13/23 0825  BP: (!) 93/49 90/77  112/63  Pulse: 93 (!) 105  95  Resp: 16 20  19   Temp:  (!) 97.5 F (36.4 C)  97.6 F (36.4 C)  TempSrc:  Axillary  Axillary  SpO2: 97% 100%  100%  Weight:   51.1 kg     Intake/Output Summary (Last 24 hours) at 04/13/2023 1234 Last data filed at 04/13/2023 0825 Gross per 24 hour  Intake 120 ml  Output --  Net 120 ml   Filed Weights   04/13/23 0451  Weight: 51.1 kg    Examination:   Awake, alert, oriented and appropriate Symmetrical Chest wall movement, also at the bases RRR,No Gallops,Rubs or new Murmurs, No Parasternal Heave +ve B.Sounds, Abd Soft, No tenderness, No rebound - guarding or rigidity. Lower extremity with erythema and ulceration in the second toe         Data Reviewed: I have personally reviewed following labs and imaging studies  CBC: Recent Labs  Lab 04/10/23 1826 04/10/23 2035 04/11/23 0515 04/11/23 0607 04/11/23 1002 04/11/23 2050 04/13/23 0555  WBC 13.3*  --  14.6*  --   --  16.4* 12.8*  NEUTROABS 11.8*  --   --   --   --   --   --   HGB 11.0*   < > 10.8* 12.9 12.9 11.1* 10.7*  HCT 37.6   < > 38.2 38.0 38.0 37.2 36.6  MCV 97.2  --  100.5*  --   --  94.7 95.8  PLT 198  --  200  --   --  235 192   < > = values in this interval not displayed.    Basic Metabolic Panel: Recent Labs  Lab 04/10/23 1826 04/10/23 2035  04/11/23 0251 04/11/23 0515 04/11/23 0607 04/11/23 1002 04/13/23 0555  NA 139   < > 139 139 139 138 135  K 3.7   < > 3.8 4.1 4.0 4.1 4.5  CL 98  --   --  99  --   --  92*  CO2 21*  --   --  22  --   --  23  GLUCOSE 208*  --   --  107*  --   --  115*  BUN 34*  --   --  35*  --   --  34*  CREATININE 3.83*  --   --  3.88*  --   --  3.91*  CALCIUM 8.1*  --   --  8.1*  --   --  9.9  MG 2.3  --   --  2.4  --   --   --   PHOS  --   --   --  7.6*  --   --  7.7*   < > = values in this interval not displayed.    GFR: Estimated Creatinine Clearance: 8.5 mL/min (A) (by C-G formula based on SCr of 3.91 mg/dL (H)).  Liver Function Tests: Recent Labs  Lab 04/10/23 1826 04/11/23 0515 04/13/23 0555  AST 36 24  --  ALT 31 29  --   ALKPHOS 65 61  --   BILITOT 1.3* 1.2  --   PROT 5.9* 5.4*  --   ALBUMIN 2.5* 2.3* 3.1*    CBG: Recent Labs  Lab 04/12/23 1224 04/12/23 1635 04/12/23 2127 04/13/23 0759 04/13/23 1214  GLUCAP 114* 107* 87 133* 293*     Recent Results (from the past 240 hours)  Respiratory (~20 pathogens) panel by PCR     Status: Abnormal   Collection Time: 04/11/23  9:52 AM   Specimen: Nasopharyngeal Swab; Respiratory  Result Value Ref Range Status   Adenovirus NOT DETECTED NOT DETECTED Final   Coronavirus 229E NOT DETECTED NOT DETECTED Final    Comment: (NOTE) The Coronavirus on the Respiratory Panel, DOES NOT test for the novel  Coronavirus (2019 nCoV)    Coronavirus HKU1 NOT DETECTED NOT DETECTED Final   Coronavirus NL63 NOT DETECTED NOT DETECTED Final   Coronavirus OC43 NOT DETECTED NOT DETECTED Final   Metapneumovirus NOT DETECTED NOT DETECTED Final   Rhinovirus / Enterovirus NOT DETECTED NOT DETECTED Final   Influenza A H3 DETECTED (A) NOT DETECTED Final   Influenza B NOT DETECTED NOT DETECTED Final   Parainfluenza Virus 1 NOT DETECTED NOT DETECTED Final   Parainfluenza Virus 2 NOT DETECTED NOT DETECTED Final   Parainfluenza Virus 3 NOT DETECTED NOT  DETECTED Final   Parainfluenza Virus 4 NOT DETECTED NOT DETECTED Final   Respiratory Syncytial Virus NOT DETECTED NOT DETECTED Final   Bordetella pertussis NOT DETECTED NOT DETECTED Final   Bordetella Parapertussis NOT DETECTED NOT DETECTED Final   Chlamydophila pneumoniae NOT DETECTED NOT DETECTED Final   Mycoplasma pneumoniae NOT DETECTED NOT DETECTED Final    Comment: Performed at [facility], [address], [place], [place] [address]  Resp panel by RT-PCR (RSV, Flu A&B, Covid) Nasopharyngeal Swab     Status: Abnormal   Collection Time: 04/11/23  9:52 AM   Specimen: Nasopharyngeal Swab; Nasal Swab  Result Value Ref Range Status   SARS Coronavirus 2 by RT PCR NEGATIVE NEGATIVE Final   Influenza A by PCR POSITIVE (A) NEGATIVE Final   Influenza B by PCR NEGATIVE NEGATIVE Final    Comment: (NOTE) The Xpert Xpress SARS-CoV-2/FLU/RSV plus assay is intended as an aid in the diagnosis of influenza from Nasopharyngeal swab specimens and should not be used as a sole basis for treatment. Nasal washings and aspirates are unacceptable for Xpert Xpress SARS-CoV-2/FLU/RSV testing.  Fact Sheet for Patients: BloggerCourse.com  Fact Sheet for Healthcare Providers: SeriousBroker.it  This test is not yet approved or cleared by the [place] FDA and has been authorized for detection and/or diagnosis of SARS-CoV-2 by FDA under an Emergency Use Authorization (EUA). This EUA will remain in effect (meaning this test can be used) for the duration of the COVID-19 declaration under Section 564(b)(1) of the Act, 21 U.S.C. section 360bbb-3(b)(1), unless the authorization is terminated or revoked.     Resp Syncytial Virus by PCR NEGATIVE NEGATIVE Final    Comment: (NOTE) Fact Sheet for Patients: BloggerCourse.com  Fact Sheet for Healthcare Providers: SeriousBroker.it  This test is not yet  approved or cleared by the [place] FDA and has been authorized for detection and/or diagnosis of SARS-CoV-2 by FDA under an Emergency Use Authorization (EUA). This EUA will remain in effect (meaning this test can be used) for the duration of the COVID-19 declaration under Section 564(b)(1) of the Act, 21 U.S.C. section 360bbb-3(b)(1), unless the authorization is terminated  or revoked.  Performed at [facility], [address], [place], [place] [address]   Culture, blood (Routine X 2) w Reflex to ID Panel     Status: None (Preliminary result)   Collection Time: 04/11/23  8:50 PM   Specimen: BLOOD  Result Value Ref Range Status   Specimen Description BLOOD SITE NOT SPECIFIED  Final   Special Requests   Final    BOTTLES DRAWN AEROBIC AND ANAEROBIC Blood Culture results may not be optimal due to an inadequate volume of blood received in culture bottles   Culture   Final    NO GROWTH 2 DAYS Performed at [facility], [address], [place], [place] [address]    Report Status PENDING  Incomplete  Culture, blood (Routine X 2) w Reflex to ID Panel     Status: None (Preliminary result)   Collection Time: 04/11/23 11:52 PM   Specimen: BLOOD RIGHT HAND  Result Value Ref Range Status   Specimen Description BLOOD RIGHT HAND  Final   Special Requests   Final    BOTTLES DRAWN AEROBIC ONLY Blood Culture results may not be optimal due to an inadequate volume of blood received in culture bottles   Culture   Final    NO GROWTH 1 DAY Performed at [facility], [address] [place], [place] [address]    Report Status PENDING  Incomplete         Radiology Studies: No results found.       Scheduled Meds:  amiodarone  200 mg Oral Daily   Chlorhexidine Gluconate Cloth  6 each Topical Q0600   doxercalciferol  3 mcg Intravenous Q M,W,F-HD   heparin injection (subcutaneous)  5,000 Units Subcutaneous Q8H   lipase/protease/amylase  36,000 Units Oral TID WC    midodrine  10 mg Oral TID WC   multivitamin  1 tablet Oral QHS   oseltamivir  30 mg Oral Q48H   oseltamivir  30 mg Oral Once   sevelamer carbonate  800 mg Oral TID WC   Continuous Infusions:  albumin human 25 g (04/11/23 1627)   cefTRIAXone (ROCEPHIN)  IV     doxycycline (VIBRAMYCIN) IV 100 mg (04/13/23 0817)     LOS: 3 days        Huey Bienenstock, MD [facility]   To contact the attending provider between 7A-7P or the covering provider during after hours 7P-7A, please log into the web site [email or web address] and access using universal [place] password for that web site. If you do not have the password, please call the hospital operator.  04/13/2023, 12:34 PM

## 2023-04-13 NOTE — Progress Notes (Signed)
 [facility] Progress Note   Subjective:    Seen and examined patient at bedside. Slowly weaning down O2 requirement. On HFNC 4L. She's more awake and interactive today. She's asking when she can go home. Next HD tomorrow.  Objective Vitals:   04/13/23 0200 04/13/23 0400 04/13/23 0451 04/13/23 0825  BP: (!) 93/49 90/77  112/63  Pulse: 93 (!) 105  95  Resp: 16 20  19   Temp:  (!) 97.5 F (36.4 C)  97.6 F (36.4 C)  TempSrc:  Axillary  Axillary  SpO2: 97% 100%  100%  Weight:   51.1 kg    Physical Exam General: Elderly woman; chronically ill-appearing; NAD HEENT: (+) JVD-improving Heart: S1 and S2; No murmurs, gallops, or rubs Lungs: Fine crackles left lower-mid lobes; No wheezing or rhonchi Abdomen: Soft and non-tender Extremities:Erythema noted bilateral lower extremities, no edema Dialysis Access: R TDC; L AVF   Filed Weights   04/13/23 0451  Weight: 51.1 kg    Intake/Output Summary (Last 24 hours) at 04/13/2023 1458 Last data filed at 04/13/2023 0825 Gross per 24 hour  Intake 120 ml  Output --  Net 120 ml    Additional Objective Labs: Basic Metabolic Panel: Recent Labs  Lab 04/10/23 1826 04/10/23 2035 04/11/23 0515 04/11/23 0607 04/11/23 1002 04/13/23 0555  NA 139   < > 139 139 138 135  K 3.7   < > 4.1 4.0 4.1 4.5  CL 98  --  99  --   --  92*  CO2 21*  --  22  --   --  23  GLUCOSE 208*  --  107*  --   --  115*  BUN 34*  --  35*  --   --  34*  CREATININE 3.83*  --  3.88*  --   --  3.91*  CALCIUM 8.1*  --  8.1*  --   --  9.9  PHOS  --   --  7.6*  --   --  7.7*   < > = values in this interval not displayed.   Liver Function Tests: Recent Labs  Lab 04/10/23 1826 04/11/23 0515 04/13/23 0555  AST 36 24  --   ALT 31 29  --   ALKPHOS 65 61  --   BILITOT 1.3* 1.2  --   PROT 5.9* 5.4*  --   ALBUMIN 2.5* 2.3* 3.1*   No results for input(s): "LIPASE", "AMYLASE" in the last 168 hours. CBC: Recent Labs  Lab 04/10/23 1826 04/10/23 2035  04/11/23 0515 04/11/23 0607 04/11/23 1002 04/11/23 2050 04/13/23 0555  WBC 13.3*  --  14.6*  --   --  16.4* 12.8*  NEUTROABS 11.8*  --   --   --   --   --   --   HGB 11.0*   < > 10.8*   < > 12.9 11.1* 10.7*  HCT 37.6   < > 38.2   < > 38.0 37.2 36.6  MCV 97.2  --  100.5*  --   --  94.7 95.8  PLT 198  --  200  --   --  235 192   < > = values in this interval not displayed.   Blood Culture    Component Value Date/Time   SDES BLOOD RIGHT HAND 04/11/2023 2352   SPECREQUEST  04/11/2023 2352    BOTTLES DRAWN AEROBIC ONLY Blood Culture results may not be optimal due to an inadequate volume of blood received in culture  bottles   CULT  04/11/2023 2352    NO GROWTH 1 DAY Performed at [facility], [address] [place], [place] [address]    REPTSTATUS PENDING 04/11/2023 2352    Cardiac Enzymes: No results for input(s): "CKTOTAL", "CKMB", "CKMBINDEX", "TROPONINI" in the last 168 hours. CBG: Recent Labs  Lab 04/12/23 1224 04/12/23 1635 04/12/23 2127 04/13/23 0759 04/13/23 1214  GLUCAP 114* 107* 87 133* 293*   Iron Studies: No results for input(s): "IRON", "TIBC", "TRANSFERRIN", "FERRITIN" in the last 72 hours. Lab Results  Component Value Date   INR 1.1 03/26/2023   INR 1.0 03/14/2022   INR 1.4 (H) 01/22/2022   Studies/Results: No results found.  Medications:  albumin human 25 g (04/11/23 1627)   cefTRIAXone (ROCEPHIN)  IV     doxycycline (VIBRAMYCIN) IV 100 mg (04/13/23 0817)    amiodarone  200 mg Oral Daily   Chlorhexidine Gluconate Cloth  6 each Topical Q0600   doxercalciferol  3 mcg Intravenous Q M,W,F-HD   heparin injection (subcutaneous)  5,000 Units Subcutaneous Q8H   lipase/protease/amylase  36,000 Units Oral TID WC   midodrine  10 mg Oral TID WC   multivitamin  1 tablet Oral QHS   oseltamivir  30 mg Oral Q48H   oseltamivir  30 mg Oral Once   sevelamer carbonate  800 mg Oral TID WC    Dialysis Orders: [facility] MWF 3.5h   B350   50.5kg   2K bath   TDC+   AVF (using TDC) Heparin none - last OP HD 2/19, post wt 49.6kg  - getting to dry wt, sometimes under, very low IDWG's - hectorol 3 mcg - venofer 50 weekly - mircera 225 mcg q2, last 2/17, due 3/03  Assessment/Plan: Sepsis - probable LLE cellulitis, started on IV abx w/ cefepime and vanc. Poss pna.   Acute hypoxic / hypercarbic resp failure - recent flu A, LLL opacity on CXR. Seen by pulm for consult. Sending RVP/ covid / flu, blood cx's. Prn bipap. IV abx for poss pna. She is usually NOT on O2 at home. O2 requirement slowly coming down. Off Bipap and now on HFNC 4L. Left foot ulcer - Seen by Dr. Lajoyce Corners today, per his note, watch for now. No need for ABXs and not a surgical candidate. Plan to f/u in outpatient. ESRD - on HD MWF. Next HD 2/24. BP - on midodrine 10 tid at home, continue here. BP's low here 100- 110s systolic.  Volume - no vol overload, may be losing body wt. Mild calf edema. UF 1-2 L as tol Anemia of esrd - Hb 10- 12 here, no esa needs.  Secondary hyperparathyroidism - CCa in range, phos a bit high. Cont binder w/ meals and IV vdra.  RCC - s/p ablation c/b urine leak s/p neph tube  Salome Holmes, NP [facility] 04/13/2023,2:58 PM  LOS: 3 days

## 2023-04-14 ENCOUNTER — Telehealth: Payer: Self-pay

## 2023-04-14 DIAGNOSIS — N186 End stage renal disease: Secondary | ICD-10-CM | POA: Diagnosis not present

## 2023-04-14 DIAGNOSIS — I480 Paroxysmal atrial fibrillation: Secondary | ICD-10-CM | POA: Diagnosis not present

## 2023-04-14 LAB — GLUCOSE, CAPILLARY
Glucose-Capillary: 193 mg/dL — ABNORMAL HIGH (ref 70–99)
Glucose-Capillary: 214 mg/dL — ABNORMAL HIGH (ref 70–99)
Glucose-Capillary: 197 mg/dL — ABNORMAL HIGH (ref 70–99)

## 2023-04-14 LAB — CBC WITH DIFFERENTIAL/PLATELET
Abs Immature Granulocytes: 0 10*3/uL (ref 0.00–0.07)
Basophils Absolute: 0 10*3/uL (ref 0.0–0.1)
Basophils Relative: 0 %
Eosinophils Absolute: 0 10*3/uL (ref 0.0–0.5)
Eosinophils Relative: 0 %
HCT: 35.3 % — ABNORMAL LOW (ref 36.0–46.0)
Hemoglobin: 10.7 g/dL — ABNORMAL LOW (ref 12.0–15.0)
Lymphocytes Relative: 4 %
Lymphs Abs: 0.5 10*3/uL — ABNORMAL LOW (ref 0.7–4.0)
MCH: 28.4 pg (ref 26.0–34.0)
MCHC: 30.3 g/dL (ref 30.0–36.0)
MCV: 93.6 fL (ref 80.0–100.0)
Monocytes Absolute: 0.3 10*3/uL (ref 0.1–1.0)
Monocytes Relative: 2 %
Neutro Abs: 12.2 10*3/uL — ABNORMAL HIGH (ref 1.7–7.7)
Neutrophils Relative %: 94 %
Platelets: 199 10*3/uL (ref 150–400)
RBC: 3.77 MIL/uL — ABNORMAL LOW (ref 3.87–5.11)
RDW: 21.6 % — ABNORMAL HIGH (ref 11.5–15.5)
WBC: 13 10*3/uL — ABNORMAL HIGH (ref 4.0–10.5)
nRBC: 1.8 % — ABNORMAL HIGH (ref 0.0–0.2)
nRBC: 4 /100{WBCs} — ABNORMAL HIGH

## 2023-04-14 LAB — RENAL FUNCTION PANEL
Albumin: 2.7 g/dL — ABNORMAL LOW (ref 3.5–5.0)
Anion gap: 18 — ABNORMAL HIGH (ref 5–15)
BUN: 46 mg/dL — ABNORMAL HIGH (ref 8–23)
CO2: 20 mmol/L — ABNORMAL LOW (ref 22–32)
Calcium: 9.1 mg/dL (ref 8.9–10.3)
Chloride: 93 mmol/L — ABNORMAL LOW (ref 98–111)
Creatinine, Ser: 4.78 mg/dL — ABNORMAL HIGH (ref 0.44–1.00)
GFR, Estimated: 8 mL/min — ABNORMAL LOW (ref >60)
Glucose, Bld: 172 mg/dL — ABNORMAL HIGH (ref 70–99)
Phosphorus: 7.4 mg/dL — ABNORMAL HIGH (ref 2.5–4.6)
Potassium: 5.2 mmol/L — ABNORMAL HIGH (ref 3.5–5.1)
Sodium: 131 mmol/L — ABNORMAL LOW (ref 135–145)

## 2023-04-14 MED ORDER — AMIODARONE HCL 200 MG PO TABS: 200.0000 mg | ORAL_TABLET | Freq: Every day | ORAL | Status: DC

## 2023-04-14 MED ORDER — AMIODARONE IV BOLUS ONLY 150 MG/100ML
150.0000 mg | Freq: Once | INTRAVENOUS | Status: AC
  Administered 2023-04-14: 150 mg via INTRAVENOUS
  Filled 2023-04-14: qty 100

## 2023-04-14 MED ORDER — MELATONIN 5 MG PO TABS
5.0000 mg | ORAL_TABLET | Freq: Every evening | ORAL | Status: DC | PRN
  Administered 2023-04-14 – 2023-04-15 (×2): 5 mg via ORAL
  Filled 2023-04-14 (×2): qty 1

## 2023-04-14 MED ORDER — DILTIAZEM HCL 25 MG/5ML IV SOLN: 10.0000 mg | INTRAVENOUS | Status: DC | PRN

## 2023-04-14 MED ORDER — METOPROLOL TARTRATE 5 MG/5ML IV SOLN: 5.0000 mg | INTRAVENOUS | Status: DC | PRN

## 2023-04-14 MED ORDER — OSELTAMIVIR PHOSPHATE 30 MG PO CAPS
30.0000 mg | ORAL_CAPSULE | ORAL | Status: DC
  Filled 2023-04-14: qty 1

## 2023-04-14 MED ORDER — ZOLPIDEM TARTRATE 5 MG PO TABS
2.5000 mg | ORAL_TABLET | Freq: Once | ORAL | Status: AC
  Administered 2023-04-14: 2.5 mg via ORAL
  Filled 2023-04-14: qty 1

## 2023-04-14 MED ORDER — AMIODARONE HCL 200 MG PO TABS
200.0000 mg | ORAL_TABLET | Freq: Two times a day (BID) | ORAL | Status: DC
Start: 2023-04-14 — End: 2023-04-16
  Administered 2023-04-14: 200 mg via ORAL
  Filled 2023-04-14: qty 1

## 2023-04-14 NOTE — Progress Notes (Signed)
 OT Cancellation Note  Patient Details Name: Kim Burns MRN: [id number] DOB: February 16, 1936   Cancelled Treatment:    Reason Eval/Treat Not Completed: Fatigue/lethargy limiting ability to participate pt greeted asleep in supine with pt initially able to arouse and agreeable to OT intervention; however, pt unable to maintain attention long enough to engage purposefully in session. Will continue efforts to see pt for OT session as time allows.   Kim Burns., COTA/L Acute Rehabilitation Services [phone]   Barron Schmid 04/14/2023, 11:56 AM

## 2023-04-14 NOTE — Plan of Care (Signed)
  Problem: Coping: Goal: Ability to adjust to condition or change in health will improve Outcome: Progressing   Problem: Fluid Volume: Goal: Ability to maintain a balanced intake and output will improve Outcome: Progressing   Problem: Health Behavior/Discharge Planning: Goal: Ability to manage health-related needs will improve Outcome: Progressing   Problem: Nutritional: Goal: Maintenance of adequate nutrition will improve Outcome: Progressing   Problem: Skin Integrity: Goal: Risk for impaired skin integrity will decrease Outcome: Progressing   Problem: Health Behavior/Discharge Planning: Goal: Ability to manage health-related needs will improve Outcome: Progressing   Problem: Clinical Measurements: Goal: Will remain free from infection Outcome: Progressing Goal: Respiratory complications will improve Outcome: Progressing Goal: Cardiovascular complication will be avoided Outcome: Progressing   Problem: Activity: Goal: Risk for activity intolerance will decrease Outcome: Progressing

## 2023-04-14 NOTE — Progress Notes (Signed)
 PROGRESS NOTE    Kim Burns  GNF:[id number] DOB: 1935/11/28 DOA: 04/10/2023 PCP: Lewis Moccasin, MD    Chief Complaint  Patient presents with   Shortness of Breath    Brief Narrative:   88 year old chronically ill-appearing Caucasian female with a past medical history significant for end-stage renal disease on hemodialysis Monday Wednesday Friday, and intraductal papillary mucinous neoplasm of the pancreas status post Whipple procedure in 2018, persistent atrial fibrillation not on anticoagulation, hypertension, diabetes mellitus type 2, chronic diastolic CHF, anemia chronic disease . Presents to ED secondary to severe sepsis, osteomyelitis and lower extremity cellulitis, complicated by recent influenza infection, hypotensive, required significant IV fluid resuscitation, then she developed respiratory failure in the setting of volume overload, requiring BiPAP support, significant improvement of respiratory status after 2 L ultrafiltration hemodialysis, significantly improved with antibiotics.   Assessment & Plan:   Principal Problem:   Generalized weakness Active Problems:   Severe sepsis (HCC)   ESRD (end stage renal disease) (HCC)   Lactic acidosis   Paroxysmal atrial fibrillation (HCC)   DM2 (diabetes mellitus, type 2) (HCC)   Chronic diastolic CHF (congestive heart failure) (HCC)   History of anemia due to chronic kidney disease   Elevated troponin   Acute osteomyelitis (HCC)   Acute on chronic respiratory failure with hypoxia and hypercapnia (HCC)   Osteomyelitis of second toe of left foot (HCC)   Severe sepsis, present on admission secondary to left lower foot cellulitis with second toe osteomyelitis as well influenza infection with pneumonia, concern for community-acquired pneumonia -sepsis present on admission -No further IV fluid given pulmonary edema and respiratory failure -She was on doxycycline at home -Broad-spectrum antibiotic, including vancomycin,  cefepime. -Patient is improving, will narrow antibiotics, will change to IV Bactrim and Rocephin. -Blood cultures remain negative -MRI foot significant for second great toe osteomyelitis, Dr. Lajoyce Corners input greatly appreciated, continue with local wound care, and narrow antibiotics with recommendation for outpatient follow-up  Acute metabolic encephalopathy secondary to hypercarbia and sepsis -Patient much improved, back to baseline   Acute respiratory failure with hypercarbia and hypoxia Patient influenza A pneumonia and left lower lobe opacity Acute on chronic diastolic CHF -Required BiPAP support, respiratory status much improved, 4 L nasal cannula this morning, will continue to wean as tolerated, encouraged use incentive spirometry and flutter valve -When severe sepsis she required ample IV fluid resuscitation -Much improved after hemodialysis  -Continue with BiPAP as needed basis only, will DC from her room today -Volume management with HD -Continue with IV antibiotic for bacterial pneumonia -Continue with Tamiflu for influenza pneumonia -Wean oxygen as tolerated, she was 4 L nasal cannula this morning, weaned to 1 L oxygen  Hypotension -Continue with midodrine, as well can be related to sepsis  Elevated troponins History of CAD Paroxysmal A-fib not on chronic anticoagulation due to history of GI bleed -Troponins elevated on admission, due to demand ischemia in the setting of sepsis, hypoxia and ESRD -Initial heparin GTT, no indication for anticoagulation per cardiology on admission, discontinued this patient with her significant easy bruising and history of GI bleed -Not a candidate for anticoagulation given history of GI bleed   Paroxysmal A-fib with RVR -This morning heart rate elevated in the 140s to 150s, she received 150 mg of IV bolus, continue with home amiodarone, will increase dosing during hospital stay.     Anemia of end-stage renal disease -IV iron and Procrit  per  renal as needed  Diabetes mellitus, type II, uncontrolled with hypoglycemia -  Most recent A1c 5.4  -She is with hypoglycemia yesterday requiring D5 drip, will decrease drip rate and insulin sliding scale  History of intraductal papillary mucinous neoplasm of the pancreas status post Whipple -With Creon with meals once diet is advanced  Renal cell carcinoma -Status post ablation, complicated by urine leak, status post nephrostomy tube  ESRD, on hemodialysis -Renal consulted, Monday Wednesday Friday schedule   DVT prophylaxis: SCD, heparin drip has been discontinued.  Subcutaneous heparin Code Status: Full Family Communication: none at bedside, discussed with son by phone 2/24 Disposition:   Status is: Inpatient    Consultants:  Renal Ortho Dr Lajoyce Corners PCCM   Subjective:  No significant events overnight as discussed with staff, asking when she can go home  Objective: Vitals:   04/14/23 0944 04/14/23 1000 04/14/23 1005 04/14/23 1010  BP:  (!) 127/91    Pulse: (!) 124     Resp: 19 12 19 19   Temp:      TempSrc:      SpO2: 96% 92%    Weight:        Intake/Output Summary (Last 24 hours) at 04/14/2023 1320 Last data filed at 04/14/2023 0942 Gross per 24 hour  Intake 240 ml  Output 25 ml  Net 215 ml   Filed Weights   04/13/23 0451  Weight: 51.1 kg    Examination:  Awake Alert, Oriented X 3, stable frail, deconditioned Symmetrical Chest wall movement, good air entry today irregular irregular, tachycardic no Gallops,Rubs or new Murmurs, No Parasternal Heave +ve B.Sounds, Abd Soft, No tenderness, No rebound - guarding or rigidity. No Cyanosis, chronic lower extremity skin changes, left lower extremity erythema and ulceration of the second toe     Data Reviewed: I have personally reviewed following labs and imaging studies  CBC: Recent Labs  Lab 04/10/23 1826 04/10/23 2035 04/11/23 0515 04/11/23 0607 04/11/23 1002 04/11/23 2050 04/13/23 0555 04/14/23 0514   WBC 13.3*  --  14.6*  --   --  16.4* 12.8* 13.0*  NEUTROABS 11.8*  --   --   --   --   --   --  12.2*  HGB 11.0*   < > 10.8* 12.9 12.9 11.1* 10.7* 10.7*  HCT 37.6   < > 38.2 38.0 38.0 37.2 36.6 35.3*  MCV 97.2  --  100.5*  --   --  94.7 95.8 93.6  PLT 198  --  200  --   --  235 192 199   < > = values in this interval not displayed.    Basic Metabolic Panel: Recent Labs  Lab 04/10/23 1826 04/10/23 2035 04/11/23 0515 04/11/23 0607 04/11/23 1002 04/13/23 0555 04/14/23 0514  NA 139   < > 139 139 138 135 131*  K 3.7   < > 4.1 4.0 4.1 4.5 5.2*  CL 98  --  99  --   --  92* 93*  CO2 21*  --  22  --   --  23 20*  GLUCOSE 208*  --  107*  --   --  115* 172*  BUN 34*  --  35*  --   --  34* 46*  CREATININE 3.83*  --  3.88*  --   --  3.91* 4.78*  CALCIUM 8.1*  --  8.1*  --   --  9.9 9.1  MG 2.3  --  2.4  --   --   --   --   PHOS  --   --  7.6*  --   --  7.7* 7.4*   < > = values in this interval not displayed.    GFR: Estimated Creatinine Clearance: 6.9 mL/min (A) (by C-G formula based on SCr of 4.78 mg/dL (H)).  Liver Function Tests: Recent Labs  Lab 04/10/23 1826 04/11/23 0515 04/13/23 0555 04/14/23 0514  AST 36 24  --   --   ALT 31 29  --   --   ALKPHOS 65 61  --   --   BILITOT 1.3* 1.2  --   --   PROT 5.9* 5.4*  --   --   ALBUMIN 2.5* 2.3* 3.1* 2.7*    CBG: Recent Labs  Lab 04/13/23 0759 04/13/23 1214 04/13/23 1625 04/13/23 2130 04/14/23 0944  GLUCAP 133* 293* 216* 221* 193*     Recent Results (from the past 240 hours)  Respiratory (~20 pathogens) panel by PCR     Status: Abnormal   Collection Time: 04/11/23  9:52 AM   Specimen: Nasopharyngeal Swab; Respiratory  Result Value Ref Range Status   Adenovirus NOT DETECTED NOT DETECTED Final   Coronavirus 229E NOT DETECTED NOT DETECTED Final    Comment: (NOTE) The Coronavirus on the Respiratory Panel, DOES NOT test for the novel  Coronavirus (2019 nCoV)    Coronavirus HKU1 NOT DETECTED NOT DETECTED Final    Coronavirus NL63 NOT DETECTED NOT DETECTED Final   Coronavirus OC43 NOT DETECTED NOT DETECTED Final   Metapneumovirus NOT DETECTED NOT DETECTED Final   Rhinovirus / Enterovirus NOT DETECTED NOT DETECTED Final   Influenza A H3 DETECTED (A) NOT DETECTED Final   Influenza B NOT DETECTED NOT DETECTED Final   Parainfluenza Virus 1 NOT DETECTED NOT DETECTED Final   Parainfluenza Virus 2 NOT DETECTED NOT DETECTED Final   Parainfluenza Virus 3 NOT DETECTED NOT DETECTED Final   Parainfluenza Virus 4 NOT DETECTED NOT DETECTED Final   Respiratory Syncytial Virus NOT DETECTED NOT DETECTED Final   Bordetella pertussis NOT DETECTED NOT DETECTED Final   Bordetella Parapertussis NOT DETECTED NOT DETECTED Final   Chlamydophila pneumoniae NOT DETECTED NOT DETECTED Final   Mycoplasma pneumoniae NOT DETECTED NOT DETECTED Final    Comment: Performed at [facility], [address], [place], [place] [address]  Resp panel by RT-PCR (RSV, Flu A&B, Covid) Nasopharyngeal Swab     Status: Abnormal   Collection Time: 04/11/23  9:52 AM   Specimen: Nasopharyngeal Swab; Nasal Swab  Result Value Ref Range Status   SARS Coronavirus 2 by RT PCR NEGATIVE NEGATIVE Final   Influenza A by PCR POSITIVE (A) NEGATIVE Final   Influenza B by PCR NEGATIVE NEGATIVE Final    Comment: (NOTE) The Xpert Xpress SARS-CoV-2/FLU/RSV plus assay is intended as an aid in the diagnosis of influenza from Nasopharyngeal swab specimens and should not be used as a sole basis for treatment. Nasal washings and aspirates are unacceptable for Xpert Xpress SARS-CoV-2/FLU/RSV testing.  Fact Sheet for Patients: BloggerCourse.com  Fact Sheet for Healthcare Providers: SeriousBroker.it  This test is not yet approved or cleared by the [place] FDA and has been authorized for detection and/or diagnosis of SARS-CoV-2 by FDA under an Emergency Use Authorization (EUA). This EUA will remain in  effect (meaning this test can be used) for the duration of the COVID-19 declaration under Section 564(b)(1) of the Act, 21 U.S.C. section 360bbb-3(b)(1), unless the authorization is terminated or revoked.     Resp Syncytial Virus by PCR NEGATIVE NEGATIVE Final  Comment: (NOTE) Fact Sheet for Patients: BloggerCourse.com  Fact Sheet for Healthcare Providers: SeriousBroker.it  This test is not yet approved or cleared by the [place] FDA and has been authorized for detection and/or diagnosis of SARS-CoV-2 by FDA under an Emergency Use Authorization (EUA). This EUA will remain in effect (meaning this test can be used) for the duration of the COVID-19 declaration under Section 564(b)(1) of the Act, 21 U.S.C. section 360bbb-3(b)(1), unless the authorization is terminated or revoked.  Performed at [facility], [address], [place], [place] [address]   Culture, blood (Routine X 2) w Reflex to ID Panel     Status: None (Preliminary result)   Collection Time: 04/11/23  8:50 PM   Specimen: BLOOD  Result Value Ref Range Status   Specimen Description BLOOD SITE NOT SPECIFIED  Final   Special Requests   Final    BOTTLES DRAWN AEROBIC AND ANAEROBIC Blood Culture results may not be optimal due to an inadequate volume of blood received in culture bottles   Culture   Final    NO GROWTH 3 DAYS Performed at [facility], [address], [place], [place] [address]    Report Status PENDING  Incomplete  Culture, blood (Routine X 2) w Reflex to ID Panel     Status: None (Preliminary result)   Collection Time: 04/11/23 11:52 PM   Specimen: BLOOD RIGHT HAND  Result Value Ref Range Status   Specimen Description BLOOD RIGHT HAND  Final   Special Requests   Final    BOTTLES DRAWN AEROBIC ONLY Blood Culture results may not be optimal due to an inadequate volume of blood received in culture bottles   Culture   Final    NO GROWTH 2  DAYS Performed at [facility], [address], [place], [place] [address]    Report Status PENDING  Incomplete         Radiology Studies: No results found.       Scheduled Meds:  amiodarone  200 mg Oral Daily   Chlorhexidine Gluconate Cloth  6 each Topical Q0600   doxercalciferol  3 mcg Intravenous Q M,W,F-HD   heparin injection (subcutaneous)  5,000 Units Subcutaneous Q8H   lipase/protease/amylase  36,000 Units Oral TID WC   midodrine  10 mg Oral TID WC   multivitamin  1 tablet Oral QHS   oseltamivir  30 mg Oral Q M,W,F-2000   sevelamer carbonate  800 mg Oral TID WC   Continuous Infusions:  albumin human 25 g (04/11/23 1627)   cefTRIAXone (ROCEPHIN)  IV 2 g (04/13/23 2126)   doxycycline (VIBRAMYCIN) IV 100 mg (04/14/23 0823)     LOS: 4 days        Huey Bienenstock, MD [facility]   To contact the attending provider between 7A-7P or the covering provider during after hours 7P-7A, please log into the web site [email or web address] and access using universal [employer] password for that web site. If you do not have the password, please call the hospital operator.  04/14/2023, 1:20 PM

## 2023-04-14 NOTE — Telephone Encounter (Signed)
 Attempted to call for surgery scheduling. LVM

## 2023-04-14 NOTE — Progress Notes (Addendum)
 Kim Burns   Subjective: Seen in room. Says she didn't sleep overnight. HD later today.   Objective Vitals:   04/14/23 0944 04/14/23 1000 04/14/23 1005 04/14/23 1010  BP:  (!) 127/91    Pulse: (!) 124     Resp: 19 12 19 19   Temp:      TempSrc:      SpO2: 96% 92%    Weight:       Physical Exam General: Elderly woman; chronically ill-appearing; NAD Heart: S1, S2; No M/R/G Lungs: Fine crackles left lower-upper lobes; No wheezing or rhonchi Abdomen: Soft and non-tender. Nephrostomy tube L f Extremities:Woody appearance BLE no edema Dialysis Access: R TDC; L AVF   Additional Objective Labs: Basic Metabolic Panel: Recent Labs  Lab 04/11/23 0515 04/11/23 0607 04/11/23 1002 04/13/23 0555 04/14/23 0514  NA 139   < > 138 135 131*  K 4.1   < > 4.1 4.5 5.2*  CL 99  --   --  92* 93*  CO2 22  --   --  23 20*  GLUCOSE 107*  --   --  115* 172*  BUN 35*  --   --  34* 46*  CREATININE 3.88*  --   --  3.91* 4.78*  CALCIUM 8.1*  --   --  9.9 9.1  PHOS 7.6*  --   --  7.7* 7.4*   < > = values in this interval not displayed.   Liver Function Tests: Recent Labs  Lab 04/10/23 1826 04/11/23 0515 04/13/23 0555 04/14/23 0514  AST 36 24  --   --   ALT 31 29  --   --   ALKPHOS 65 61  --   --   BILITOT 1.3* 1.2  --   --   PROT 5.9* 5.4*  --   --   ALBUMIN 2.5* 2.3* 3.1* 2.7*   No results for input(s): "LIPASE", "AMYLASE" in the last 168 hours. CBC: Recent Labs  Lab 04/10/23 1826 04/10/23 2035 04/11/23 0515 04/11/23 0607 04/11/23 2050 04/13/23 0555 04/14/23 0514  WBC 13.3*  --  14.6*  --  16.4* 12.8* 13.0*  NEUTROABS 11.8*  --   --   --   --   --  12.2*  HGB 11.0*   < > 10.8*   < > 11.1* 10.7* 10.7*  HCT 37.6   < > 38.2   < > 37.2 36.6 35.3*  MCV 97.2  --  100.5*  --  94.7 95.8 93.6  PLT 198  --  200  --  235 192 199   < > = values in this interval not displayed.   Blood Culture    Component Value Date/Time   SDES BLOOD RIGHT HAND  04/11/2023 2352   SPECREQUEST  04/11/2023 2352    BOTTLES DRAWN AEROBIC ONLY Blood Culture results may not be optimal due to an inadequate volume of blood received in culture bottles   CULT  04/11/2023 2352    NO GROWTH 2 DAYS Performed at [facility], [address] [place], [place] [address]    REPTSTATUS PENDING 04/11/2023 2352    Cardiac Enzymes: No results for input(s): "CKTOTAL", "CKMB", "CKMBINDEX", "TROPONINI" in the last 168 hours. CBG: Recent Labs  Lab 04/13/23 0759 04/13/23 1214 04/13/23 1625 04/13/23 2130 04/14/23 0944  GLUCAP 133* 293* 216* 221* 193*   Iron Studies: No results for input(s): "IRON", "TIBC", "TRANSFERRIN", "FERRITIN" in the last 72 hours. @lablastinr3 @ Studies/Results: No results found.  Medications:  albumin human 25 g (04/11/23 1627)   cefTRIAXone (ROCEPHIN)  IV 2 g (04/13/23 2126)   doxycycline (VIBRAMYCIN) IV 100 mg (04/14/23 0823)    amiodarone  200 mg Oral Daily   Chlorhexidine Gluconate Cloth  6 each Topical Q0600   doxercalciferol  3 mcg Intravenous Q M,W,F-HD   heparin injection (subcutaneous)  5,000 Units Subcutaneous Q8H   lipase/protease/amylase  36,000 Units Oral TID WC   midodrine  10 mg Oral TID WC   multivitamin  1 tablet Oral QHS   oseltamivir  30 mg Oral Q M,W,F-2000   sevelamer carbonate  800 mg Oral TID WC     Dialysis Orders: [facility] MWF 3.5h   B350   50.5kg   2K bath   TDC+  AVF (using TDC) Heparin none - last OP HD 2/19, post wt 49.6kg  - getting to dry wt, sometimes under, very low IDWG's - hectorol 3 mcg - venofer 50 weekly - mircera 225 mcg q2, last 2/17, due 3/03   Assessment/Plan: Sepsis - probable LLE cellulitis, started on IV abx w/ cefepime and vanc. Poss pna.   Acute hypoxic / hypercarbic resp failure - recent flu A, LLL opacity on CXR. Seen by pulm for consult. Sending RVP/ covid / flu, blood cx's. Prn bipap. IV abx for poss pna. She is usually NOT on O2 at home. O2 requirement slowly coming down. Off  Bipap and now on HFNC 4L. Left foot ulcer - Seen by Dr. Lajoyce Corners today, per his Burns, watch for now. No need for ABXs and not a surgical candidate. Plan to f/u in outpatient. ESRD - on HD MWF. Next HD 2/24. BP - on midodrine 10 tid at home, continue here. BP's low here 100- 110s systolic.  Volume - no vol overload, may be losing body wt. Mild calf edema. UF 1-2 L as tol Anemia of esrd - Hb 10- 12 here, no esa needs.  Secondary hyperparathyroidism - CCa in range, phos a bit high. Cont binder w/ meals and IV vdra.  RCC - s/p ablation c/b urine leak s/p neph tube  Kim H. Brown Kim Burns 04/14/2023, 1:17 PM  [employer] [phone]     Seen and examined independently.  Agree with Burns and exam as documented above by physician extender and as noted here.  On droplet precautions for flu.  Patient states they use her tunn catheter at HD - not her AVF  General frail elderly female in bed in NAD  HEENT normocephalic atraumatic extraocular movements intact sclera anicteric Neck supple trachea midline Lungs clear to auscultation bilaterally normal work of breathing at rest on 1.5 liters oxygen Heart S1S2 no rub Abdomen soft nontender nondistended Extremities no pitting edema  Psych normal mood and affect Access: LUE AVF with bruit and thrill; right internal jugular tunn catheter  ESRD - On HD per MWF schedule  Acute hypox resp failure - abx per primary. With influenza A infection.  Optimize volume with HD   Left foot ulcer - abx per primary team   Kim Emms, Kim Burns 04/14/2023  3:17 PM

## 2023-04-15 DIAGNOSIS — N186 End stage renal disease: Secondary | ICD-10-CM | POA: Diagnosis not present

## 2023-04-15 DIAGNOSIS — I480 Paroxysmal atrial fibrillation: Secondary | ICD-10-CM | POA: Diagnosis not present

## 2023-04-15 LAB — GLUCOSE, CAPILLARY
Glucose-Capillary: 128 mg/dL — ABNORMAL HIGH (ref 70–99)
Glucose-Capillary: 164 mg/dL — ABNORMAL HIGH (ref 70–99)
Glucose-Capillary: 139 mg/dL — ABNORMAL HIGH (ref 70–99)

## 2023-04-15 LAB — MAGNESIUM: Magnesium: 2.5 mg/dL — ABNORMAL HIGH (ref 1.7–2.4)

## 2023-04-15 MED ORDER — HEPARIN SODIUM (PORCINE) 1000 UNIT/ML IJ SOLN
INTRAMUSCULAR | Status: AC
  Administered 2023-04-15: 3200 [IU]
  Filled 2023-04-15: qty 4

## 2023-04-15 MED ORDER — AMIODARONE HCL IN DEXTROSE 360-4.14 MG/200ML-% IV SOLN
60.0000 mg/h | INTRAVENOUS | Status: AC
  Administered 2023-04-15: 60 mg/h via INTRAVENOUS
  Filled 2023-04-15: qty 200

## 2023-04-15 MED ORDER — MIDODRINE HCL 5 MG PO TABS
5.0000 mg | ORAL_TABLET | Freq: Three times a day (TID) | ORAL | Status: DC
  Administered 2023-04-15 – 2023-04-19 (×8): 5 mg via ORAL
  Filled 2023-04-15 (×8): qty 1

## 2023-04-15 MED ORDER — OSELTAMIVIR PHOSPHATE 30 MG PO CAPS
30.0000 mg | ORAL_CAPSULE | Freq: Every day | ORAL | Status: AC
  Administered 2023-04-15 – 2023-04-16 (×2): 30 mg via ORAL
  Filled 2023-04-15 (×2): qty 1

## 2023-04-15 MED ORDER — AMIODARONE HCL IN DEXTROSE 360-4.14 MG/200ML-% IV SOLN
30.0000 mg/h | INTRAVENOUS | Status: DC
  Administered 2023-04-15 (×2): 30 mg/h via INTRAVENOUS
  Filled 2023-04-15 (×3): qty 200

## 2023-04-15 MED ORDER — METOPROLOL TARTRATE 25 MG PO TABS
25.0000 mg | ORAL_TABLET | Freq: Two times a day (BID) | ORAL | Status: DC
  Administered 2023-04-15 – 2023-04-19 (×8): 25 mg via ORAL
  Filled 2023-04-15 (×8): qty 1

## 2023-04-15 NOTE — Progress Notes (Signed)
 Received patient in bed to unit.  Alert and oriented.  Informed consent signed and in chart.    Patient  didn't tolerated well. Signed out AMA patient stated she doesn't feel well. MD foster notified. AMA form signed Transported back to the room  Alert, without acute distress.  Hand-off given to patient's nurse.   Access used: cathter Access issues: n/a  Total UF removed: 1.5 Medication(s) given: n/a  04/15/23 1207  Vitals  Temp 97.7 F (36.5 C)  Temp Source Axillary  BP (!) 139/96  MAP (mmHg) 109  BP Location Right Arm  BP Method Automatic  Patient Position (if appropriate) Lying  Pulse Rate (!) 34  Pulse Rate Source Monitor  ECG Heart Rate 88  Resp 20  Oxygen Therapy  SpO2 99 %  O2 Device Nasal Cannula  During Treatment Monitoring  Blood Flow Rate (mL/min) 349 mL/min  Arterial Pressure (mmHg) -74.14 mmHg  Venous Pressure (mmHg) 136.96 mmHg  TMP (mmHg) 4.64 mmHg  Ultrafiltration Rate (mL/min) 828 mL/min  Dialysate Flow Rate (mL/min) 300 ml/min  Dialysate Potassium Concentration 2  Dialysate Calcium Concentration 2.5  Duration of HD Treatment -hour(s) 2.92 hour(s)  Cumulative Fluid Removed (mL) per Treatment  1517.13  HD Safety Checks Performed Yes  Intra-Hemodialysis Comments Tx completed (patient signed out AMA)  Post Treatment  Dialyzer Clearance Lightly streaked  Liters Processed 61.3  Fluid Removed (mL) 1500 mL  Tolerated HD Treatment No (Comment)  Hemodialysis Catheter Right Internal jugular Double lumen Permanent (Tunneled)  Placement Date/Time: 03/13/23 1147   Placed prior to admission: No  Serial / Lot #: [id number]  Expiration Date: 06/18/27  Time Out: Correct patient;Correct site;Correct procedure  Maximum sterile barrier precautions: Hand hygiene;Large sterile sheet;C...  Site Condition No complications  Blue Lumen Status Heparin locked  Red Lumen Status Heparin locked  Catheter fill solution Heparin 1000 units/ml  Catheter fill volume (Arterial)  1.6 cc  Catheter fill volume (Venous) 1.6  Dressing Type Transparent  Dressing Status Antimicrobial disc/dressing in place  Dressing Change Due 04/18/23  Post treatment catheter status Capped and Clamped      [facility]

## 2023-04-15 NOTE — Plan of Care (Signed)

## 2023-04-15 NOTE — Progress Notes (Addendum)
 [facility] Progress Note   Subjective: Seen on HD via TDC. BP higher started, will challenge UFG a bit. Noted K+ elevated. Per primary note, she has been started on IV bactrim. Have reached out to pharmacy/primary regarding changing to different agent as bactrim potentates hyperkalemia in elderly and ESRD pts.     BP higher today and in past 24 hours. Decrease midodrine.  Objective Vitals:   04/15/23 0521 04/15/23 0534 04/15/23 0834 04/15/23 0904  BP:   (!) 150/99 (!) 138/104  Pulse: (!) 112  (!) 116 (!) 120  Resp: 17  20 (!) 25  Temp: 97.6 F (36.4 C)  (!) 97.5 F (36.4 C)   TempSrc: Oral     SpO2: 97%  100% 100%  Weight:  52.6 kg 61.4 kg    Physical Exam General: Elderly woman; chronically ill-appearing; NAD Heart: S1, S2; No M/R/G Lungs: Fine crackles left lower-upper lobes; No wheezing or rhonchi Abdomen: Soft and non-tender. Nephrostomy tube L f Extremities:Erythema BLE no edema Dialysis Access: R TDC; L AVF     Additional Objective Labs: Basic Metabolic Panel: Recent Labs  Lab 04/11/23 0515 04/11/23 0607 04/11/23 1002 04/13/23 0555 04/14/23 0514  NA 139   < > 138 135 131*  K 4.1   < > 4.1 4.5 5.2*  CL 99  --   --  92* 93*  CO2 22  --   --  23 20*  GLUCOSE 107*  --   --  115* 172*  BUN 35*  --   --  34* 46*  CREATININE 3.88*  --   --  3.91* 4.78*  CALCIUM 8.1*  --   --  9.9 9.1  PHOS 7.6*  --   --  7.7* 7.4*   < > = values in this interval not displayed.   Liver Function Tests: Recent Labs  Lab 04/10/23 1826 04/11/23 0515 04/13/23 0555 04/14/23 0514  AST 36 24  --   --   ALT 31 29  --   --   ALKPHOS 65 61  --   --   BILITOT 1.3* 1.2  --   --   PROT 5.9* 5.4*  --   --   ALBUMIN 2.5* 2.3* 3.1* 2.7*   No results for input(s): "LIPASE", "AMYLASE" in the last 168 hours. CBC: Recent Labs  Lab 04/10/23 1826 04/10/23 2035 04/11/23 0515 04/11/23 0607 04/11/23 2050 04/13/23 0555 04/14/23 0514  WBC 13.3*  --  14.6*  --  16.4* 12.8*  13.0*  NEUTROABS 11.8*  --   --   --   --   --  12.2*  HGB 11.0*   < > 10.8*   < > 11.1* 10.7* 10.7*  HCT 37.6   < > 38.2   < > 37.2 36.6 35.3*  MCV 97.2  --  100.5*  --  94.7 95.8 93.6  PLT 198  --  200  --  235 192 199   < > = values in this interval not displayed.   Blood Culture    Component Value Date/Time   SDES BLOOD RIGHT HAND 04/11/2023 2352   SPECREQUEST  04/11/2023 2352    BOTTLES DRAWN AEROBIC ONLY Blood Culture results may not be optimal due to an inadequate volume of blood received in culture bottles   CULT  04/11/2023 2352    NO GROWTH 3 DAYS Performed at [facility], [address] [place], [place] [address]    REPTSTATUS PENDING 04/11/2023 2352  Cardiac Enzymes: No results for input(s): "CKTOTAL", "CKMB", "CKMBINDEX", "TROPONINI" in the last 168 hours. CBG: Recent Labs  Lab 04/13/23 1625 04/13/23 2130 04/14/23 0944 04/14/23 1402 04/14/23 2130  GLUCAP 216* 221* 193* 214* 197*   Iron Studies: No results for input(s): "IRON", "TIBC", "TRANSFERRIN", "FERRITIN" in the last 72 hours. @lablastinr3 @ Studies/Results: No results found. Medications:  albumin human 25 g (04/11/23 1627)   amiodarone 30 mg/hr (04/15/23 0719)   cefTRIAXone (ROCEPHIN)  IV 2 g (04/14/23 2126)   doxycycline (VIBRAMYCIN) IV 100 mg (04/15/23 0728)    Chlorhexidine Gluconate Cloth  6 each Topical Q0600   doxercalciferol  3 mcg Intravenous Q M,W,F-HD   heparin injection (subcutaneous)  5,000 Units Subcutaneous Q8H   lipase/protease/amylase  36,000 Units Oral TID WC   metoprolol tartrate  25 mg Oral BID   midodrine  10 mg Oral TID WC   multivitamin  1 tablet Oral QHS   oseltamivir  30 mg Oral Q M,W,F-2000   sevelamer carbonate  800 mg Oral TID WC     Dialysis Orders: [facility] MWF 3.5h   B350   50.5kg   2K bath   TDC+  AVF (using TDC) Heparin none - last OP HD 2/19, post wt 49.6kg  - getting to dry wt, sometimes under, very low IDWG's - hectorol 3 mcg - venofer 50 weekly -  mircera 225 mcg q2, last 2/17, due 3/03   Assessment/Plan: Sepsis - probable LLE cellulitis, started on IV abx w/ cefepime and vanc, now rocephin and doxy ordered.   Acute hypoxic / hypercarbic resp failure - recent flu A, LLL opacity on CXR. Seen by pulm for consult. Sending RVP/ covid / flu, blood cx's. Prn bipap. IV abx for poss pna. She is usually NOT on O2 at home. O2 requirement slowly coming down. Off Bipap and now on HFNC 4L. Left foot ulcer - Seen by Dr. Lajoyce Corners today, per his note, watch for now. Not a surgical candidate. Plan to f/u in outpatient. ESRD - on HD MWF. Next HD 04/16/2023 BP - on midodrine 10 tid at home however BP now higher. Decrease midodrine to 5 mg PO TID, consider changing to HD days only if she holds this trend.   Volume - no vol overload, may be losing body wt. Mild calf edema. UF 1-2 L as tol Anemia of esrd - Hb 10- 12 here, no esa needs.  Secondary hyperparathyroidism - CCa in range, phos a bit high. Cont binder w/ meals and IV vdra.  RCC - s/p ablation c/b urine leak s/p neph tube   Medication Issues; Preferred narcotic agents for pain control are hydromorphone, fentanyl, and methadone. Morphine should not be used.  Baclofen should be avoided Avoid oral sodium phosphate and magnesium citrate based laxatives / bowel preps      Rita H. Brown NP-C 04/15/2023, 9:12 AM  [employer] [phone]   Seen and examined independently.  Agree with note and exam as documented above by physician extender and as noted here.  Seen and examined on dialysis.  Blood pressure 149/89 and HR 99.  RIJ tunn catheter.  On 2 liters.  Tolerating goal.  Will increase to 1.5 kg as tolerated - wet cough with flu   Estanislado Emms, MD 04/15/2023  9:52 AM

## 2023-04-15 NOTE — Procedures (Signed)
 HD Note:  Some information was entered later than the data was gathered due to patient care needs. The stated time with the data is accurate.  Received patient in bed to unit.   Alert and oriented.   Informed consent signed and in chart.   Access used: Upper right chest HD catheter Access issues: None   During rounds, Dr. Azzie Almas reviewed patient vital signs and increased the UF goal from 1L to 2L .    Hand-off given to oncoming dialysis nurse for treatment completion    Karysa Heft L. Dareen Piano, RN [facility].

## 2023-04-15 NOTE — Progress Notes (Signed)
 Physical Therapy Treatment Patient Details Name: Kim Burns MRN: [id number] DOB: [birth year] Today's Date: 04/15/2023   History of Present Illness 88 y.o. female presents to ED 2/20 secondary to severe sepsis, osteomyelitis and lower extremity cellulitis, complicated by recent influenza infection, hypotensive, developed respiratory failure in the setting of volume overload, requiring BiPAP support, significant improvement of respiratory status after 2 L ultrafiltration hemodialysis, significantly improved with antibiotics. PMH - afib, ESRD on HD, chf, DM, HTN, [place], gout, bradycardia, TIA, CVA.    PT Comments  Lethargic today, agreeable to work with PT but declines transfer to chair. States she is exhausted and has not been able to sleep for 2 nights. Assisted pt to EOB with up to mod assist for LE and trunk. Once seated tolerated EOB at supervision level to eat portion of a cookie and drink some water. Emphasized WB through LEs, able to squat/scoot along EOB to reposition and return to bed due to fatigue/lethargy. Hopeful that after some rest and adequate dialysis she will bounce back, otherwise family will need to determine if they can provide adequate care as she currently requires additional assistance for basic mobility and ADLs. Pt is eager to return home but SNF might need to be discussed for post acute rehab if slow to recover. Patient will continue to benefit from skilled physical therapy services to further improve independence with functional mobility.     If plan is discharge home, recommend the following: A little help with walking and/or transfers;A little help with bathing/dressing/bathroom;Assist for transportation;Help with stairs or ramp for entrance;Assistance with cooking/housework   Can travel by private vehicle        Equipment Recommendations  None recommended by PT    Recommendations for Other Services       Precautions / Restrictions Precautions Precautions:  Fall Recall of Precautions/Restrictions: Intact Precaution/Restrictions Comments: Lt nephrostomy drain Required Braces or Orthoses: Other Brace Other Brace: L post-op shoe (Not found in room) Restrictions Weight Bearing Restrictions Per Provider Order: No     Mobility  Bed Mobility Overal bed mobility: Needs Assistance Bed Mobility: Sit to Supine, Supine to Sit     Supine to sit: Mod assist Sit to supine: Min assist   General bed mobility comments: Mod assist to facilitate LEs out of bed and for trunk support to rise. Min for LE lift back into bed and reposition.    Transfers Overall transfer level: Needs assistance Equipment used: None               General transfer comment: Perfromed small squat/lateral scoot along bed to reposition. Fatigued rapidly. Cues for technique, bed pad used to reduced friction.    Ambulation/Gait                   Stairs             Wheelchair Mobility     Tilt Bed    Modified Rankin (Stroke Patients Only)       Balance Overall balance assessment: Needs assistance, History of Falls Sitting-balance support: No upper extremity supported, Feet supported Sitting balance-Leahy Scale: Fair                                      Communication Communication Communication: No apparent difficulties;Impaired Factors Affecting Communication: Hearing impaired  Cognition Arousal: Lethargic Behavior During Therapy: Flat affect   PT - Cognitive impairments: No family/caregiver present to  determine baseline, Orientation, Memory, Attention, Initiation, Sequencing, Problem solving                       PT - Cognition Comments: Conversing appropriately but easily dozes off. Following commands: Intact Following commands impaired: Only follows one step commands consistently, Follows one step commands with increased time    Cueing Cueing Techniques: Verbal cues, Gestural cues  Exercises      General  Comments General comments (skin integrity, edema, etc.): VSS during session      Pertinent Vitals/Pain      Home Living                          Prior Function            PT Goals (current goals can now be found in the care plan section) Acute Rehab PT Goals Patient Stated Goal: to go home PT Goal Formulation: With patient Time For Goal Achievement: 05/02/23 Potential to Achieve Goals: Fair Progress towards PT goals: Not progressing toward goals - comment    Frequency    Min 1X/week      PT Plan      Co-evaluation              AM-PAC PT "6 Clicks" Mobility   Outcome Measure  Help needed turning from your back to your side while in a flat bed without using bedrails?: A Little Help needed moving from lying on your back to sitting on the side of a flat bed without using bedrails?: A Lot Help needed moving to and from a bed to a chair (including a wheelchair)?: A Little Help needed standing up from a chair using your arms (e.g., wheelchair or bedside chair)?: A Little Help needed to walk in hospital room?: A Lot Help needed climbing 3-5 steps with a railing? : A Lot 6 Click Score: 15    End of Session Equipment Utilized During Treatment: Gait belt;Oxygen Activity Tolerance: Patient limited by fatigue Patient left: with call bell/phone within reach;in bed;with bed alarm set   PT Visit Diagnosis: Muscle weakness (generalized) (M62.81);Unsteadiness on feet (R26.81);History of falling (Z91.81);Difficulty in walking, not elsewhere classified (R26.2)     Time: 9147-8295 PT Time Calculation (min) (ACUTE ONLY): 22 min  Charges:    $Therapeutic Activity: 8-22 mins PT General Charges $$ ACUTE PT VISIT: 1 Visit                     Kathlyn Sacramento, PT, DPT [facility]  Rehabilitation Services Physical Therapist Office: [phone] Website: [email or web address]    Berton Mount 04/15/2023, 3:30 PM

## 2023-04-15 NOTE — Progress Notes (Signed)
 PT Cancellation Note  Patient Details Name: Kim Burns MRN: [id number] DOB: 05/06/1935   Cancelled Treatment:    Reason Eval/Treat Not Completed: Patient at procedure or test/unavailable  Patient off unit this morning for HD. Will check back this afternoon as schedule permits.  Kathlyn Sacramento, PT, DPT [facility]  Rehabilitation Services Physical Therapist Office: [phone] Website: [email or web address]  Berton Mount 04/15/2023, 8:31 AM

## 2023-04-15 NOTE — Progress Notes (Signed)
 PROGRESS NOTE    Kim Burns  ZOX:[id number] DOB: 10/27/1935 DOA: 04/10/2023 PCP: Lewis Moccasin, MD    Chief Complaint  Patient presents with   Shortness of Breath    Brief Narrative:   88 year old chronically ill-appearing Caucasian female with a past medical history significant for end-stage renal disease on hemodialysis Monday Wednesday Friday, and intraductal papillary mucinous neoplasm of the pancreas status post Whipple procedure in 2018, persistent atrial fibrillation not on anticoagulation, hypertension, diabetes mellitus type 2, chronic diastolic CHF, anemia chronic disease . Presents to ED secondary to severe sepsis, osteomyelitis and lower extremity cellulitis, complicated by recent influenza infection, hypotensive, required significant IV fluid resuscitation, then she developed respiratory failure in the setting of volume overload, requiring BiPAP support, significant improvement of respiratory status after 2 L ultrafiltration hemodialysis, significantly improved with antibiotics.   Assessment & Plan:   Principal Problem:   Generalized weakness Active Problems:   Severe sepsis (HCC)   ESRD (end stage renal disease) (HCC)   Lactic acidosis   Paroxysmal atrial fibrillation (HCC)   DM2 (diabetes mellitus, type 2) (HCC)   Chronic diastolic CHF (congestive heart failure) (HCC)   History of anemia due to chronic kidney disease   Elevated troponin   Acute osteomyelitis (HCC)   Acute on chronic respiratory failure with hypoxia and hypercapnia (HCC)   Osteomyelitis of second toe of left foot (HCC)   Severe sepsis, present on admission secondary to left lower foot cellulitis with second toe osteomyelitis as well influenza infection with pneumonia, concern for community-acquired pneumonia -sepsis present on admission -No further IV fluid given pulmonary edema and respiratory failure -She was on doxycycline at home -She was started on broad-spectrum antibiotic  including vancomycin and cefepime, this was narrowed to doxycycline and Rocephin, this can be transitioned to doxycycline and Augmentin on discharge to cover medically for cellulitis. -Blood cultures remain negative -MRI foot significant for second great toe osteomyelitis, Dr. Lajoyce Corners input greatly appreciated, continue with local wound care, and narrow antibiotics with recommendation for outpatient follow-up  Acute metabolic encephalopathy secondary to hypercarbia and sepsis -Patient much improved, back to baseline  Acute respiratory failure with hypercarbia and hypoxia Patient influenza A pneumonia and left lower lobe opacity Acute on chronic diastolic CHF -Required BiPAP support, respiratory status much improved, 4 L nasal cannula this morning, will continue to wean as tolerated, encouraged use incentive spirometry and flutter valve -When severe sepsis she required ample IV fluid resuscitation -Much improved after hemodialysis  -DC BiPAP -Volume management with HD -IV antibiotic for bacterial pneumonia -Tamiflu for influenza pneumonia -Wean oxygen as tolerated, she is down to 2 L oxygen.  Hypotension -Continue with midodrine, as well can be related to sepsis  Elevated troponins History of CAD Paroxysmal A-fib not on chronic anticoagulation due to history of GI bleed -Troponins elevated on admission, due to demand ischemia in the setting of sepsis, hypoxia and ESRD -Initial heparin GTT, no indication for anticoagulation per cardiology on admission, discontinued this patient with her significant easy bruising and history of GI bleed -Not a candidate for anticoagulation given history of GI bleed  -Heart rate significantly uncontrolled yesterday she received amiodarone bolus, heart rate significantly elevated overnight for which she was started on amiodarone drip, as well as started on low-dose metoprolol, cardiology will be consulted for further recommendations.  Anemia of end-stage renal  disease -IV iron and Procrit  per renal as needed  Diabetes mellitus, type II, uncontrolled with hypoglycemia -Most recent A1c 5.4  -She is  with hypoglycemia yesterday requiring D5 drip, will decrease drip rate and insulin sliding scale  History of intraductal papillary mucinous neoplasm of the pancreas status post Whipple -With Creon with meals once diet is advanced  Renal cell carcinoma -Status post ablation, complicated by urine leak, status post nephrostomy tube  ESRD, on hemodialysis -Renal consulted, Monday Wednesday Friday schedule   DVT prophylaxis: SCD, heparin drip has been discontinued.  Subcutaneous heparin Code Status: Full Family Communication: none at bedside, discussed with son by phone 2/24 Disposition:   Status is: Inpatient    Consultants:  Renal Ortho Dr Lajoyce Corners PCCM   Subjective:  Patient developed A-fib with RVR overnight started on amiodarone drip, she could not finish her total hemodialysis, it was discontinued 30 minutes before completion has she reports she was not feeling good.  Objective: Vitals:   04/15/23 1205 04/15/23 1207 04/15/23 1210 04/15/23 1243  BP: (!) 139/96 (!) 139/96  139/85  Pulse: 95 (!) 34  91  Resp: 17 20  19   Temp:  97.7 F (36.5 C)    TempSrc:  Axillary    SpO2: 100% 99%  99%  Weight:   50.4 kg     Intake/Output Summary (Last 24 hours) at 04/15/2023 1548 Last data filed at 04/15/2023 1207 Gross per 24 hour  Intake --  Output 1500 ml  Net -1500 ml   Filed Weights   04/15/23 0534 04/15/23 0834 04/15/23 1210  Weight: 52.6 kg 61.4 kg 50.4 kg    Examination:   Awake Alert, Oriented X 3, extremely frail, chronic ill-appearing, deconditioned Symmetrical Chest wall movement, Good air movement bilaterally irregular, tachycardic  +ve B.Sounds, Abd Soft, No tenderness, No rebound - guarding or rigidity. Bilateral lower extremity chronic skin changes, left lower extremity bandaged     Data Reviewed: I have  personally reviewed following labs and imaging studies  CBC: Recent Labs  Lab 04/10/23 1826 04/10/23 2035 04/11/23 0515 04/11/23 0607 04/11/23 1002 04/11/23 2050 04/13/23 0555 04/14/23 0514  WBC 13.3*  --  14.6*  --   --  16.4* 12.8* 13.0*  NEUTROABS 11.8*  --   --   --   --   --   --  12.2*  HGB 11.0*   < > 10.8* 12.9 12.9 11.1* 10.7* 10.7*  HCT 37.6   < > 38.2 38.0 38.0 37.2 36.6 35.3*  MCV 97.2  --  100.5*  --   --  94.7 95.8 93.6  PLT 198  --  200  --   --  235 192 199   < > = values in this interval not displayed.    Basic Metabolic Panel: Recent Labs  Lab 04/10/23 1826 04/10/23 2035 04/11/23 0515 04/11/23 0607 04/11/23 1002 04/13/23 0555 04/14/23 0514 04/15/23 0456  NA 139   < > 139 139 138 135 131*  --   K 3.7   < > 4.1 4.0 4.1 4.5 5.2*  --   CL 98  --  99  --   --  92* 93*  --   CO2 21*  --  22  --   --  23 20*  --   GLUCOSE 208*  --  107*  --   --  115* 172*  --   BUN 34*  --  35*  --   --  34* 46*  --   CREATININE 3.83*  --  3.88*  --   --  3.91* 4.78*  --   CALCIUM 8.1*  --  8.1*  --   --  9.9 9.1  --   MG 2.3  --  2.4  --   --   --   --  2.5*  PHOS  --   --  7.6*  --   --  7.7* 7.4*  --    < > = values in this interval not displayed.    GFR: Estimated Creatinine Clearance: 6.8 mL/min (A) (by C-G formula based on SCr of 4.78 mg/dL (H)).  Liver Function Tests: Recent Labs  Lab 04/10/23 1826 04/11/23 0515 04/13/23 0555 04/14/23 0514  AST 36 24  --   --   ALT 31 29  --   --   ALKPHOS 65 61  --   --   BILITOT 1.3* 1.2  --   --   PROT 5.9* 5.4*  --   --   ALBUMIN 2.5* 2.3* 3.1* 2.7*    CBG: Recent Labs  Lab 04/13/23 2130 04/14/23 0944 04/14/23 1402 04/14/23 2130 04/15/23 1249  GLUCAP 221* 193* 214* 197* 128*     Recent Results (from the past 240 hours)  Respiratory (~20 pathogens) panel by PCR     Status: Abnormal   Collection Time: 04/11/23  9:52 AM   Specimen: Nasopharyngeal Swab; Respiratory  Result Value Ref Range Status    Adenovirus NOT DETECTED NOT DETECTED Final   Coronavirus 229E NOT DETECTED NOT DETECTED Final    Comment: (NOTE) The Coronavirus on the Respiratory Panel, DOES NOT test for the novel  Coronavirus (2019 nCoV)    Coronavirus HKU1 NOT DETECTED NOT DETECTED Final   Coronavirus NL63 NOT DETECTED NOT DETECTED Final   Coronavirus OC43 NOT DETECTED NOT DETECTED Final   Metapneumovirus NOT DETECTED NOT DETECTED Final   Rhinovirus / Enterovirus NOT DETECTED NOT DETECTED Final   Influenza A H3 DETECTED (A) NOT DETECTED Final   Influenza B NOT DETECTED NOT DETECTED Final   Parainfluenza Virus 1 NOT DETECTED NOT DETECTED Final   Parainfluenza Virus 2 NOT DETECTED NOT DETECTED Final   Parainfluenza Virus 3 NOT DETECTED NOT DETECTED Final   Parainfluenza Virus 4 NOT DETECTED NOT DETECTED Final   Respiratory Syncytial Virus NOT DETECTED NOT DETECTED Final   Bordetella pertussis NOT DETECTED NOT DETECTED Final   Bordetella Parapertussis NOT DETECTED NOT DETECTED Final   Chlamydophila pneumoniae NOT DETECTED NOT DETECTED Final   Mycoplasma pneumoniae NOT DETECTED NOT DETECTED Final    Comment: Performed at [facility], [address] [place], [place] [address]  Resp panel by RT-PCR (RSV, Flu A&B, Covid) Nasopharyngeal Swab     Status: Abnormal   Collection Time: 04/11/23  9:52 AM   Specimen: Nasopharyngeal Swab; Nasal Swab  Result Value Ref Range Status   SARS Coronavirus 2 by RT PCR NEGATIVE NEGATIVE Final   Influenza A by PCR POSITIVE (A) NEGATIVE Final   Influenza B by PCR NEGATIVE NEGATIVE Final    Comment: (NOTE) The Xpert Xpress SARS-CoV-2/FLU/RSV plus assay is intended as an aid in the diagnosis of influenza from Nasopharyngeal swab specimens and should not be used as a sole basis for treatment. Nasal washings and aspirates are unacceptable for Xpert Xpress SARS-CoV-2/FLU/RSV testing.  Fact Sheet for Patients: BloggerCourse.com  Fact Sheet for Healthcare  Providers: SeriousBroker.it  This test is not yet approved or cleared by the [place] FDA and has been authorized for detection and/or diagnosis of SARS-CoV-2 by FDA under an Emergency Use Authorization (EUA). This EUA will remain in effect (meaning this test can be used) for the duration  of the COVID-19 declaration under Section 564(b)(1) of the Act, 21 U.S.C. section 360bbb-3(b)(1), unless the authorization is terminated or revoked.     Resp Syncytial Virus by PCR NEGATIVE NEGATIVE Final    Comment: (NOTE) Fact Sheet for Patients: BloggerCourse.com  Fact Sheet for Healthcare Providers: SeriousBroker.it  This test is not yet approved or cleared by the [place] FDA and has been authorized for detection and/or diagnosis of SARS-CoV-2 by FDA under an Emergency Use Authorization (EUA). This EUA will remain in effect (meaning this test can be used) for the duration of the COVID-19 declaration under Section 564(b)(1) of the Act, 21 U.S.C. section 360bbb-3(b)(1), unless the authorization is terminated or revoked.  Performed at [facility], [address] [place], [place] [address]   Culture, blood (Routine X 2) w Reflex to ID Panel     Status: None (Preliminary result)   Collection Time: 04/11/23  8:50 PM   Specimen: BLOOD  Result Value Ref Range Status   Specimen Description BLOOD SITE NOT SPECIFIED  Final   Special Requests   Final    BOTTLES DRAWN AEROBIC AND ANAEROBIC Blood Culture results may not be optimal due to an inadequate volume of blood received in culture bottles   Culture   Final    NO GROWTH 4 DAYS Performed at [facility], [address] [place], [place] [address]    Report Status PENDING  Incomplete  Culture, blood (Routine X 2) w Reflex to ID Panel     Status: None (Preliminary result)   Collection Time: 04/11/23 11:52 PM   Specimen: BLOOD RIGHT HAND  Result  Value Ref Range Status   Specimen Description BLOOD RIGHT HAND  Final   Special Requests   Final    BOTTLES DRAWN AEROBIC ONLY Blood Culture results may not be optimal due to an inadequate volume of blood received in culture bottles   Culture   Final    NO GROWTH 3 DAYS Performed at [facility], [address], [place], [place] [address]    Report Status PENDING  Incomplete         Radiology Studies: No results found.       Scheduled Meds:  Chlorhexidine Gluconate Cloth  6 each Topical Q0600   doxercalciferol  3 mcg Intravenous Q M,W,F-HD   heparin injection (subcutaneous)  5,000 Units Subcutaneous Q8H   lipase/protease/amylase  36,000 Units Oral TID WC   metoprolol tartrate  25 mg Oral BID   midodrine  5 mg Oral TID WC   multivitamin  1 tablet Oral QHS   oseltamivir  30 mg Oral Daily   sevelamer carbonate  800 mg Oral TID WC   Continuous Infusions:  albumin human 25 g (04/11/23 1627)   amiodarone 30 mg/hr (04/15/23 0719)   cefTRIAXone (ROCEPHIN)  IV 2 g (04/14/23 2126)   doxycycline (VIBRAMYCIN) IV 100 mg (04/15/23 0728)     LOS: 5 days        Huey Bienenstock, MD [facility]   To contact the attending provider between 7A-7P or the covering provider during after hours 7P-7A, please log into the web site [email or web address] and access using universal [place] password for that web site. If you do not have the password, please call the hospital operator.  04/15/2023, 3:48 PM

## 2023-04-15 NOTE — Progress Notes (Signed)
 Pt receives out-pt HD at [facility] [place] on MWF. Will assist as needed.   Olivia Canter Renal Navigator [phone]

## 2023-04-16 DIAGNOSIS — A419 Sepsis, unspecified organism: Secondary | ICD-10-CM | POA: Diagnosis not present

## 2023-04-16 DIAGNOSIS — R531 Weakness: Secondary | ICD-10-CM | POA: Diagnosis not present

## 2023-04-16 DIAGNOSIS — N186 End stage renal disease: Secondary | ICD-10-CM | POA: Diagnosis not present

## 2023-04-16 DIAGNOSIS — I480 Paroxysmal atrial fibrillation: Secondary | ICD-10-CM | POA: Diagnosis not present

## 2023-04-16 LAB — GLUCOSE, CAPILLARY
Glucose-Capillary: 159 mg/dL — ABNORMAL HIGH (ref 70–99)
Glucose-Capillary: 155 mg/dL — ABNORMAL HIGH (ref 70–99)
Glucose-Capillary: 146 mg/dL — ABNORMAL HIGH (ref 70–99)
Glucose-Capillary: 107 mg/dL — ABNORMAL HIGH (ref 70–99)

## 2023-04-16 LAB — RENAL FUNCTION PANEL
Albumin: 2.7 g/dL — ABNORMAL LOW (ref 3.5–5.0)
Anion gap: 21 — ABNORMAL HIGH (ref 5–15)
BUN: 34 mg/dL — ABNORMAL HIGH (ref 8–23)
CO2: 19 mmol/L — ABNORMAL LOW (ref 22–32)
Calcium: 9.4 mg/dL (ref 8.9–10.3)
Chloride: 90 mmol/L — ABNORMAL LOW (ref 98–111)
Creatinine, Ser: 3.77 mg/dL — ABNORMAL HIGH (ref 0.44–1.00)
GFR, Estimated: 11 mL/min — ABNORMAL LOW (ref >60)
Glucose, Bld: 116 mg/dL — ABNORMAL HIGH (ref 70–99)
Phosphorus: 6.4 mg/dL — ABNORMAL HIGH (ref 2.5–4.6)
Potassium: 4.9 mmol/L (ref 3.5–5.1)
Sodium: 130 mmol/L — ABNORMAL LOW (ref 135–145)

## 2023-04-16 LAB — CBC
HCT: 41.2 % (ref 36.0–46.0)
Hemoglobin: 12.6 g/dL (ref 12.0–15.0)
MCH: 27.9 pg (ref 26.0–34.0)
MCHC: 30.6 g/dL (ref 30.0–36.0)
MCV: 91.4 fL (ref 80.0–100.0)
Platelets: 193 10*3/uL (ref 150–400)
RBC: 4.51 MIL/uL (ref 3.87–5.11)
RDW: 21.5 % — ABNORMAL HIGH (ref 11.5–15.5)
WBC: 12.7 10*3/uL — ABNORMAL HIGH (ref 4.0–10.5)
nRBC: 1.3 % — ABNORMAL HIGH (ref 0.0–0.2)

## 2023-04-16 LAB — CULTURE, BLOOD (ROUTINE X 2): Culture: NO GROWTH

## 2023-04-16 MED ORDER — AMIODARONE HCL 200 MG PO TABS
400.0000 mg | ORAL_TABLET | Freq: Two times a day (BID) | ORAL | Status: DC
  Administered 2023-04-16 – 2023-04-17 (×4): 400 mg via ORAL
  Filled 2023-04-16 (×4): qty 2

## 2023-04-16 MED ORDER — CHLORHEXIDINE GLUCONATE CLOTH 2 % EX PADS
6.0000 | MEDICATED_PAD | Freq: Every day | CUTANEOUS | Status: DC
Start: 2023-04-17 — End: 2023-04-16

## 2023-04-16 NOTE — Progress Notes (Addendum)
 [facility] Progress Note   Subjective: Up in chair, leaving precariously forward, looks like she will fall in floor. Called for assistance to get back to bed. Says she is so tired. Explained that her dialysis will be off schedule this week. She says she understands    Objective Vitals:   04/16/23 0043 04/16/23 0400 04/16/23 0800 04/16/23 1100  BP:  133/89 132/85 123/87  Pulse: (!) 101 (!) 110 100 99  Resp: 14 16 11  (!) 21  Temp: 98.3 F (36.8 C) 97.6 F (36.4 C) 97.8 F (36.6 C) 97.8 F (36.6 C)  TempSrc: Oral Axillary Oral Oral  SpO2: 94% 94% 90% 96%  Weight:       Physical Exam General: Chronically ill appearing elderly female in NAD Heart: irreg irreg No M/R/G Lungs: Bilateral breath sounds decreased in bases otherwise CTAB.  Abdomen:NABS, NT Extremities: No LE edema Dialysis Access: RIJ TDC Drsg intact. L AVF +T/B    Additional Objective Labs: Basic Metabolic Panel: Recent Labs  Lab 04/13/23 0555 04/14/23 0514 04/16/23 0847  NA 135 131* 130*  K 4.5 5.2* 4.9  CL 92* 93* 90*  CO2 23 20* 19*  GLUCOSE 115* 172* 116*  BUN 34* 46* 34*  CREATININE 3.91* 4.78* 3.77*  CALCIUM 9.9 9.1 9.4  PHOS 7.7* 7.4* 6.4*   Liver Function Tests: Recent Labs  Lab 04/10/23 1826 04/11/23 0515 04/13/23 0555 04/14/23 0514 04/16/23 0847  AST 36 24  --   --   --   ALT 31 29  --   --   --   ALKPHOS 65 61  --   --   --   BILITOT 1.3* 1.2  --   --   --   PROT 5.9* 5.4*  --   --   --   ALBUMIN 2.5* 2.3* 3.1* 2.7* 2.7*   No results for input(s): "LIPASE", "AMYLASE" in the last 168 hours. CBC: Recent Labs  Lab 04/10/23 1826 04/10/23 2035 04/11/23 0515 04/11/23 0607 04/11/23 2050 04/13/23 0555 04/14/23 0514  WBC 13.3*  --  14.6*  --  16.4* 12.8* 13.0*  NEUTROABS 11.8*  --   --   --   --   --  12.2*  HGB 11.0*   < > 10.8*   < > 11.1* 10.7* 10.7*  HCT 37.6   < > 38.2   < > 37.2 36.6 35.3*  MCV 97.2  --  100.5*  --  94.7 95.8 93.6  PLT 198  --  200  --   235 192 199   < > = values in this interval not displayed.   Blood Culture    Component Value Date/Time   SDES BLOOD RIGHT HAND 04/11/2023 2352   SPECREQUEST  04/11/2023 2352    BOTTLES DRAWN AEROBIC ONLY Blood Culture results may not be optimal due to an inadequate volume of blood received in culture bottles   CULT  04/11/2023 2352    NO GROWTH 4 DAYS Performed at [facility], [address] [place], [place] [address]    REPTSTATUS PENDING 04/11/2023 2352    Cardiac Enzymes: No results for input(s): "CKTOTAL", "CKMB", "CKMBINDEX", "TROPONINI" in the last 168 hours. CBG: Recent Labs  Lab 04/14/23 2130 04/15/23 1249 04/15/23 1655 04/15/23 2129 04/16/23 0909  GLUCAP 197* 128* 164* 139* 159*   Iron Studies: No results for input(s): "IRON", "TIBC", "TRANSFERRIN", "FERRITIN" in the last 72 hours. @lablastinr3 @ Studies/Results: No results found. Medications:  albumin human 25 g (04/11/23  1627)   amiodarone 30 mg/hr (04/16/23 0315)   cefTRIAXone (ROCEPHIN)  IV 2 g (04/15/23 2104)   doxycycline (VIBRAMYCIN) IV 100 mg (04/16/23 0901)    Chlorhexidine Gluconate Cloth  6 each Topical Q0600   doxercalciferol  3 mcg Intravenous Q M,W,F-HD   heparin injection (subcutaneous)  5,000 Units Subcutaneous Q8H   lipase/protease/amylase  36,000 Units Oral TID WC   metoprolol tartrate  25 mg Oral BID   midodrine  5 mg Oral TID WC   multivitamin  1 tablet Oral QHS   oseltamivir  30 mg Oral Daily   sevelamer carbonate  800 mg Oral TID WC     Dialysis Orders: [facility] MWF 3.5h   B350   50.5kg   2K bath   TDC+  AVF (using TDC) Heparin none - last OP HD 2/19, post wt 49.6kg  - getting to dry wt, sometimes under, very low IDWG's - hectorol 3 mcg - venofer 50 weekly - mircera 225 mcg q2, last 2/17, due 3/03   Assessment/Plan: Sepsis - probable LLE cellulitis, started on IV abx w/ cefepime and vanc, now rocephin and doxy ordered.   Acute hypoxic / hypercarbic resp failure - recent flu  A, LLL opacity on CXR. Seen by pulm for consult. Sending RVP/ covid / flu, blood cx's. Prn bipap. IV abx for poss pna. She is usually NOT on O2 at home. O2 requirement slowly coming down. Off Bipap and now on HFNC 4L. Left foot ulcer - Seen by Dr. Lajoyce Corners today, per his note, watch for now. Not a surgical candidate. Plan to f/u in outpatient. Persistent AF-cardiology following Not on AC.  ESRD - MWF but off schedule D/T scheduling. Will continue T,Th,S schedule this week and resume MWF next week. Next HD 04/17/2023 BP - on midodrine 10 tid at home however BP now higher. Decrease midodrine to 5 mg PO TID, consider changing to HD days only if she holds this trend.   Volume - no vol overload, continue to optimize volume status with HD. Net UF 1.5 liters with HD 04/15/2023. Currently at OP EDW.  Anemia of esrd - Hb 10- 12 here, no esa needs.  Secondary hyperparathyroidism - CCa in range, phos a bit high. Cont binder w/ meals and IV vdra.  RCC - s/p ablation c/b urine leak s/p neph tube Nutrition: Low albumin, very frail. Add protein supplements.      Medication Issues; Preferred narcotic agents for pain control are hydromorphone, fentanyl, and methadone. Morphine should not be used.  Baclofen should be avoided Avoid oral sodium phosphate and magnesium citrate based laxatives / bowel preps    Rita H. Brown NP-C 04/16/2023, 11:42 AM  [employer] [phone]     Seen and examined independently.  Agree with note and exam as documented above by physician extender and as noted here.  Feels tired.    General frail elderly female in bed in NAD  HEENT normocephalic atraumatic extraocular movements intact sclera anicteric Neck supple trachea midline Lungs clear to auscultation bilaterally normal work of breathing at rest on oxygen Heart S1S2 no rub Abdomen soft nontender nondistended Extremities no pitting edema  Psych normal mood and affect Access: LUE AVF with bruit; right  internal jugular tunn catheter  ESRD - On HD per TTS schedule for this week then back to MWF schedule next week    Acute hypox resp failure - abx per primary. With influenza A infection.  Optimize volume with HD    Left foot ulcer -  abx per primary team  Anemia of CKD - no ESA needs currently  Estanislado Emms, MD 04/16/2023 1:38 PM

## 2023-04-16 NOTE — Progress Notes (Addendum)
 Rounding Note    Patient Name: Kim Burns Date of Encounter: 04/16/2023  [facility] Cardiologist: Peter Swaziland, MD   Subjective   No acute overnight events. Patient states she is breathing better. She denies any chest pain or palpitations. Her only real complaint this morning is feeling tired. She states she did not sleep well last night.  Inpatient Medications    Scheduled Meds:  Chlorhexidine Gluconate Cloth  6 each Topical Q0600   doxercalciferol  3 mcg Intravenous Q M,W,F-HD   heparin injection (subcutaneous)  5,000 Units Subcutaneous Q8H   lipase/protease/amylase  36,000 Units Oral TID WC   metoprolol tartrate  25 mg Oral BID   midodrine  5 mg Oral TID WC   multivitamin  1 tablet Oral QHS   oseltamivir  30 mg Oral Daily   sevelamer carbonate  800 mg Oral TID WC   Continuous Infusions:  albumin human 25 g (04/11/23 1627)   amiodarone 30 mg/hr (04/16/23 0315)   cefTRIAXone (ROCEPHIN)  IV 2 g (04/15/23 2104)   doxycycline (VIBRAMYCIN) IV 100 mg (04/15/23 2311)   PRN Meds: acetaminophen **OR** acetaminophen, albumin human, diltiazem, lipase/protease/amylase, melatonin, metoprolol tartrate, ondansetron (ZOFRAN) IV, sevelamer carbonate   Vital Signs    Vitals:   04/15/23 1933 04/15/23 2312 04/16/23 0043 04/16/23 0400  BP: (!) 165/133 128/83  133/89  Pulse: 77 (!) 104 (!) 101 (!) 110  Resp: 16 (!) 21 14 16   Temp: 97.7 F (36.5 C)  98.3 F (36.8 C) 97.6 F (36.4 C)  TempSrc: Oral  Oral Axillary  SpO2: 98% 94% 94% 94%  Weight:        Intake/Output Summary (Last 24 hours) at 04/16/2023 0855 Last data filed at 04/16/2023 0315 Gross per 24 hour  Intake 619.37 ml  Output 1500 ml  Net -880.63 ml      04/15/2023   12:10 PM 04/15/2023    8:34 AM 04/15/2023    5:34 AM  Last 3 Weights  Weight (lbs) 111 lb 1.8 oz 135 lb 5.8 oz 115 lb 15.4 oz  Weight (kg) 50.4 kg 61.4 kg 52.6 kg      Telemetry    Atrial fibrillation with rates in the 90s to 110s.  - Personally Reviewed  ECG    No new ECG tracing today. - Personally Reviewed  Physical Exam   GEN: Frail ill appearing Caucasian female. No acute distress.   Neck: No JVD. Cardiac: Irregularly irregular rhythm with borderline tachycardia. No murmurs, rubs, or gallops.  Respiratory: No increased work of breathing. Decreased breath sounds in bilateral bases. No significant crackles.  GI: Soft, non-distended, and non-tender.   MS: No lower extremity edema. No deformity. Skin: Warm and dry. Neuro:  No focal deficits. Psych: Normal affect. Responds appropriately.   Labs    High Sensitivity Troponin:   Recent Labs  Lab 04/10/23 2017 04/11/23 0037 04/11/23 0515  TROPONINIHS 240* 223* 173*     Chemistry Recent Labs  Lab 04/10/23 1826 04/10/23 2035 04/11/23 0515 04/11/23 0607 04/11/23 1002 04/13/23 0555 04/14/23 0514 04/15/23 0456  NA 139   < > 139   < > 138 135 131*  --   K 3.7   < > 4.1   < > 4.1 4.5 5.2*  --   CL 98  --  99  --   --  92* 93*  --   CO2 21*  --  22  --   --  23 20*  --  GLUCOSE 208*  --  107*  --   --  115* 172*  --   BUN 34*  --  35*  --   --  34* 46*  --   CREATININE 3.83*  --  3.88*  --   --  3.91* 4.78*  --   CALCIUM 8.1*  --  8.1*  --   --  9.9 9.1  --   MG 2.3  --  2.4  --   --   --   --  2.5*  PROT 5.9*  --  5.4*  --   --   --   --   --   ALBUMIN 2.5*  --  2.3*  --   --  3.1* 2.7*  --   AST 36  --  24  --   --   --   --   --   ALT 31  --  29  --   --   --   --   --   ALKPHOS 65  --  61  --   --   --   --   --   BILITOT 1.3*  --  1.2  --   --   --   --   --   GFRNONAA 11*  --  11*  --   --  11* 8*  --   ANIONGAP 20*  --  18*  --   --  20* 18*  --    < > = values in this interval not displayed.    Lipids No results for input(s): "CHOL", "TRIG", "HDL", "LABVLDL", "LDLCALC", "CHOLHDL" in the last 168 hours.  Hematology Recent Labs  Lab 04/11/23 2050 04/13/23 0555 04/14/23 0514  WBC 16.4* 12.8* 13.0*  RBC 3.93 3.82* 3.77*  HGB 11.1*  10.7* 10.7*  HCT 37.2 36.6 35.3*  MCV 94.7 95.8 93.6  MCH 28.2 28.0 28.4  MCHC 29.8* 29.2* 30.3  RDW 22.4* 21.6* 21.6*  PLT 235 192 199   Thyroid  Recent Labs  Lab 04/10/23 1826  TSH 2.488    BNP Recent Labs  Lab 04/10/23 2017 04/11/23 0902  BNP 2,195.1* 2,179.6*    DDimer No results for input(s): "DDIMER" in the last 168 hours.   Radiology    No results found.  Cardiac Studies   Echocardiogram 04/11/2023: Impressions: 1. Left ventricular ejection fraction, by estimation, is 55 to 60%. The  left ventricle has normal function. The left ventricle has no regional  wall motion abnormalities. Left ventricular diastolic parameters are  indeterminate. There is the  interventricular septum is flattened in systole and diastole, consistent  with right ventricular pressure and volume overload.   2. Right ventricular systolic function is moderately reduced. The right  ventricular size is mildly enlarged. There is severely elevated pulmonary  artery systolic pressure.   3. Left atrial size was moderately dilated.   4. Right atrial size was mildly dilated.   5. The mitral valve is normal in structure. Mild mitral valve  regurgitation. No evidence of mitral stenosis. Severe mitral annular  calcification.   6. Tricuspid valve regurgitation is moderate.   7. The aortic valve is tricuspid. Aortic valve regurgitation is not  visualized. Aortic valve sclerosis is present, with no evidence of aortic  valve stenosis.   8. The inferior vena cava is dilated in size with <50% respiratory  variability, suggesting right atrial pressure of 15 mmHg.    Patient Profile     88 y.o. female  with a history of non-obstructive CAD on cardiac catheterization in 08/2021, chronic HFpEF,  persistent atrial fibrillation not on anticoagulation, hypertension complicated by orthostatic hypotension, type 2 diabetes mellitus, CVA in 10/2022, ESRD on hemodialysis M/W/F, chronic anemia, osteomyelitis of left  second toe in 03/2023 (treated with antibiotics), Parkinson's disease, pancreatic cancer s/p Whipple procedure in 2018, renal cell carcinoma s/p left renal embolization and ablation with nephrostomy tube placement. Patient was recently admitted earlier this month from 03/26/2023 to 03/29/2023 at [facility] for cellulitis and acute osteomyelitis of left second toe which was treated with antibiotics. She was then admitted from 04/02/2023 to 04/05/2023 at [facility] the [facility] for influenza A. She was treated with Tamiflu. She then presented back to the [facility] ED on 04/10/2023 for continued cough, fatigue, and decreased PO intake and was ultimately admitted for sepsis felt to be secondary to left lower foot cellulitis/ osteomyelitis as well as recent influenza infection with pneumonia. Cardiology was consulted for further evaluation of atrial fibrillation and elevated troponin.  Assessment & Plan    Persistent Atrial Fibrillation Patient has known persistent atrial fibrillation but has not been on anticoagulation due to significant bleeding in the past while on this. She presented with generalized weakness and decreased PO intake following recent admissions for cellulitis/ osteomyelitis of left foot and influenza and was ultimately felt to be septic. She was noted to be in atrial fibrillation with intermittent RVR. Echo shows normal LV function. - Rates reasonably well controlled in the 90s to low 100s. - Currently on IV Amiodarone. We may be able to switch back to PO Amiodarone soon. Will discuss with MD. - Continue Lopressor 25mg  twice daily (on Toprol-XL 50mg  on non-dialysis days at home). - Not on anticoagulation given history of subscapular renal hematoma while on Eliquis in the past. Focus has been on rate control. - Rates reasonably well controlled. I don't think I would be overly aggressive in treating her heart rates given frailty and issues with hypotension in the past. However, will review with  MD.  Elevated Troponin Non-Obstructive CAD LHC in 08/2021 showed non-obstructive CAD. High-sensitivity troponin on admission was mildly elevated but flat at 240 >> 223 >> 173. Echo showed LVEF of 55-60% with no regional wall motion abnormalities. - No chest pain. - Can restart home Aspirin 81mg  daily when okay with primary team. - She does not appear to be on a statin at home. Given advance age and overall frailty, will not start one.  Acute on Chronic HFpEF BNP 2,195 >> 2,179. Echo this admission showed LVEF of 55-60%, moderately reduce RV function, flatted interventricular septum in systolie and diastole consistent with RV pressure/ volume overload, and severely elevated PASP. She developed respiratory failure this admission in setting of volume overload after significant IV resuscitation and required BiPAP and hemodialysis with improvement. - Euvolemic on exam. - Volume status managed via hemodialysis.  Hypertension Orthostatic Hypotension Patient has a history of hypertension complicated by orthostatic hypotension. She was initially hypotensive this admission and treated with IV fluids. - BP mildly elevated at times. - Continue Lopressor 25mg  twice daily. - Continue Midodrine 5mg  three times daily.   ESRD On hemodialysis. - Management per Nephrology.  Otherwise, management per primary team: - Sepsis - Acute metabolic encephalopathy - Cellulitis/ osteomyelitis - Recent influenza A infection - Pneumonia - Chronic anemia - Type 2 diabetes mellitus - History of pancreatic cancer - History of renal cancer   For questions or updates, please contact [facility] Please consult [email or web address]  for contact info under        Signed, Corrin Parker, PA-C  04/16/2023, 8:55 AM    History and all data above reviewed.  Patient examined.  I agree with the findings as above.   The patient does not have acute SOB or pain.   She is weak.  She does not feel her palpitations and  is lying flat back in bed.  The patient exam reveals [id number]   ,  Lungs: Clear  ,  Abd: Positive bowel sounds, no rebound no guarding, Ext No edema   .  All available labs, radiology testing, previous records reviewed. Agree with documented assessment and plan. Atrial fib:  I will try to change to PO amiodarone today.  Using this for rate control.  The risk of not using anticoag is recognized but she is very high risk for DOAC/warfarin use and is not a Watchman candidate.  Acute diastolic HF:  Volume management per dialysis.  No med titration with hypotension.    Fayrene Fearing Contrina Orona  1:44 PM  04/16/2023

## 2023-04-16 NOTE — Progress Notes (Signed)
 PROGRESS NOTE        PATIENT DETAILS Name: Kim Burns Age: 88 y.o. Sex: female Date of Birth: 22-Aug-1935 Admit Date: 04/10/2023 Admitting Physician Angie Fava, DO EXB:MWUXL, Christell Constant, MD  Brief Summary: Patient is a 88 y.o.  female with history of ESRD on HD MWF, PAF not on anticoagulation, HTN, DM-2, chronic HFpEF-who presented with shortness of breath-found to have influenza A infection with acute hypoxic/hypercarbic respiratory failure-briefly required BiPAP.  See below for further details.  Significant events: 2/20>> admit to [facility]  Significant studies: 2/20>> CXR: Increasing left basilar atelectasis/infiltrate. 2/21>> MRI left foot: Acute osteomyelitis-proximal phalanx second toe.  Significant microbiology data: 2/21>> blood culture: No growth 2/21>> influenza A: Positive 2/21>> COVID/influenza B/RSV PCR: Negative  Procedures: None  Consults: Nephrology Cardiology  Subjective: Appears frail/weak.  Denies any chest pain.  No nausea or vomiting.  Objective: Vitals: Blood pressure 123/87, pulse 99, temperature 97.8 F (36.6 C), temperature source Oral, resp. rate (!) 21, weight 50.4 kg, SpO2 96%.   Exam: Gen Exam:Alert awake-not in any distress HEENT:atraumatic, normocephalic Chest: Few bibasilar rales. CVS:S1S2 regular Abdomen:soft non tender, non distended Extremities:no edema Neurology: Non focal Skin: no rash  Pertinent Labs/Radiology:    Latest Ref Rng & Units 04/16/2023   10:49 AM 04/14/2023    5:14 AM 04/13/2023    5:55 AM  CBC  WBC 4.0 - 10.5 K/uL 12.7  13.0  12.8   Hemoglobin 12.0 - 15.0 g/dL 24.4  01.0  27.2   Hematocrit 36.0 - 46.0 % 41.2  35.3  36.6   Platelets 150 - 400 K/uL 193  199  192     Lab Results  Component Value Date   NA 130 (L) 04/16/2023   K 4.9 04/16/2023   CL 90 (L) 04/16/2023   CO2 19 (L) 04/16/2023      Assessment/Plan: Acute hypoxic/hypercarbic respiratory failure due to  influenza A pneumonia and HFpEF exacerbation Required BiPAP initially-improved with bronchodilators/Tamiflu/dialysis-now on just 1 L of oxygen.  Continues supportive care and continue to treat underlying etiologies.  Severe sepsis secondary to left foot cellulitis/acute second toe osteomyelitis/influenza infection with PNA Sepsis physiology improved All cultures negative so far Remains on empiric Rocephin/doxycycline Per Dr. Carloyn Jaeger does not need acute surgical intervention/treatment-recommends wound care.  Acute metabolic encephalopathy Overall improved-etiology likely due to hypoxia/hypercarbia/sepsis.  Persistent A-fib with RVR Rate controlled with amiodarone infusion-remains on beta-blocker Not on anticoagulation given history of bleeding in the past Cardiology now following and directing care.  Elevated troponins-likely due to demand ischemia Cardiology recommending supportive care.  ESRD on HD MWF Nephrology following  DM-2 with hypoglycemia (A1c 5.4 on 2/6) No further episodes of hypoglycemia Not on any oral hypoglycemics as an outpatient.  Recent Labs    04/15/23 2129 04/16/23 0909 04/16/23 1152  GLUCAP 139* 159* 155*    History of intraductal papillary mucinous neoplasm of the pancreas-s/p Whipple's Creon  History of renal cell carcinoma-s/p ablation-complicated by urine leak-s/p nephrostomy tube.  Debility/deconditioning SNF versus home health-TOC team following.  Code status:   Code Status: Full Code   DVT Prophylaxis: Place and maintain sequential compression device Start: 04/15/23 0210 heparin injection 5,000 Units Start: 04/12/23 2200 SCDs Start: 04/10/23 2255   Family Communication: None at bedside   Disposition Plan: Status is: Inpatient Remains inpatient appropriate because: Severity of illness   Planned Discharge  Destination:Skilled nursing facility vs HHPT   Diet: Diet Order             Diet renal with fluid restriction  Fluid restriction: 1800 mL Fluid; Room service appropriate? Yes; Fluid consistency: Thin  Diet effective now                     Antimicrobial agents: Anti-infectives (From admission, onward)    Start     Dose/Rate Route Frequency Ordered Stop   04/15/23 1800  oseltamivir (TAMIFLU) capsule 30 mg       Note to Pharmacy: Tamiflu 30 mg  q48h for CrCl <  10 mL/min   30 mg Oral Daily 04/15/23 0926 04/17/23 1759   04/14/23 2000  oseltamivir (TAMIFLU) capsule 30 mg  Status:  Discontinued       Note to Pharmacy: Tamiflu 30 mg  q48h for CrCl <  10 mL/min   30 mg Oral Every M-W-F (2000) 04/14/23 0749 04/15/23 0926   04/13/23 2200  cefTRIAXone (ROCEPHIN) 2 g in sodium chloride 0.9 % 100 mL IVPB        2 g 200 mL/hr over 30 Minutes Intravenous Every 24 hours 04/13/23 0744     04/13/23 1000  oseltamivir (TAMIFLU) capsule 30 mg  Status:  Discontinued       Note to Pharmacy: Tamiflu 30 mg  q48h for CrCl <  10 mL/min   30 mg Oral Every 48 hours 04/11/23 2356 04/14/23 0749   04/13/23 0845  doxycycline (VIBRAMYCIN) 100 mg in sodium chloride 0.9 % 250 mL IVPB        100 mg 125 mL/hr over 120 Minutes Intravenous 2 times daily 04/13/23 0757     04/12/23 2200  vancomycin (VANCOREADY) IVPB 500 mg/100 mL  Status:  Discontinued        500 mg 100 mL/hr over 60 Minutes Intravenous Every 48 hours 04/10/23 2107 04/13/23 0743   04/12/23 0100  oseltamivir (TAMIFLU) capsule 30 mg  Status:  Discontinued       Note to Pharmacy: Tamiflu 30 mg  q48h for CrCl <  10 mL/min   30 mg Oral  Once 04/12/23 0007 04/14/23 0735   04/10/23 2130  ceFEPIme (MAXIPIME) 1 g in sodium chloride 0.9 % 100 mL IVPB  Status:  Discontinued        1 g 200 mL/hr over 30 Minutes Intravenous Every 24 hours 04/10/23 2107 04/13/23 0743   04/10/23 2045  vancomycin (VANCOCIN) IVPB 1000 mg/200 mL premix        1,000 mg 200 mL/hr over 60 Minutes Intravenous  Once 04/10/23 2039 04/10/23 2243        MEDICATIONS: Scheduled Meds:  amiodarone   400 mg Oral BID   Chlorhexidine Gluconate Cloth  6 each Topical Q0600   doxercalciferol  3 mcg Intravenous Q M,W,F-HD   heparin injection (subcutaneous)  5,000 Units Subcutaneous Q8H   lipase/protease/amylase  36,000 Units Oral TID WC   metoprolol tartrate  25 mg Oral BID   midodrine  5 mg Oral TID WC   multivitamin  1 tablet Oral QHS   oseltamivir  30 mg Oral Daily   sevelamer carbonate  800 mg Oral TID WC   Continuous Infusions:  albumin human 25 g (04/11/23 1627)   cefTRIAXone (ROCEPHIN)  IV 2 g (04/15/23 2104)   doxycycline (VIBRAMYCIN) IV 100 mg (04/16/23 0901)   PRN Meds:.acetaminophen **OR** acetaminophen, albumin human, diltiazem, lipase/protease/amylase, melatonin, metoprolol tartrate, ondansetron (ZOFRAN) IV, sevelamer carbonate  I have personally reviewed following labs and imaging studies  LABORATORY DATA: CBC: Recent Labs  Lab 04/10/23 1826 04/10/23 2035 04/11/23 0515 04/11/23 0607 04/11/23 1002 04/11/23 2050 04/13/23 0555 04/14/23 0514 04/16/23 1049  WBC 13.3*  --  14.6*  --   --  16.4* 12.8* 13.0* 12.7*  NEUTROABS 11.8*  --   --   --   --   --   --  12.2*  --   HGB 11.0*   < > 10.8*   < > 12.9 11.1* 10.7* 10.7* 12.6  HCT 37.6   < > 38.2   < > 38.0 37.2 36.6 35.3* 41.2  MCV 97.2  --  100.5*  --   --  94.7 95.8 93.6 91.4  PLT 198  --  200  --   --  235 192 199 193   < > = values in this interval not displayed.    Basic Metabolic Panel: Recent Labs  Lab 04/10/23 1826 04/10/23 2035 04/11/23 0515 04/11/23 0607 04/11/23 1002 04/13/23 0555 04/14/23 0514 04/15/23 0456 04/16/23 0847  NA 139   < > 139 139 138 135 131*  --  130*  K 3.7   < > 4.1 4.0 4.1 4.5 5.2*  --  4.9  CL 98  --  99  --   --  92* 93*  --  90*  CO2 21*  --  22  --   --  23 20*  --  19*  GLUCOSE 208*  --  107*  --   --  115* 172*  --  116*  BUN 34*  --  35*  --   --  34* 46*  --  34*  CREATININE 3.83*  --  3.88*  --   --  3.91* 4.78*  --  3.77*  CALCIUM 8.1*  --  8.1*  --   --  9.9  9.1  --  9.4  MG 2.3  --  2.4  --   --   --   --  2.5*  --   PHOS  --   --  7.6*  --   --  7.7* 7.4*  --  6.4*   < > = values in this interval not displayed.    GFR: Estimated Creatinine Clearance: 8.7 mL/min (A) (by C-G formula based on SCr of 3.77 mg/dL (H)).  Liver Function Tests: Recent Labs  Lab 04/10/23 1826 04/11/23 0515 04/13/23 0555 04/14/23 0514 04/16/23 0847  AST 36 24  --   --   --   ALT 31 29  --   --   --   ALKPHOS 65 61  --   --   --   BILITOT 1.3* 1.2  --   --   --   PROT 5.9* 5.4*  --   --   --   ALBUMIN 2.5* 2.3* 3.1* 2.7* 2.7*   No results for input(s): "LIPASE", "AMYLASE" in the last 168 hours. No results for input(s): "AMMONIA" in the last 168 hours.  Coagulation Profile: No results for input(s): "INR", "PROTIME" in the last 168 hours.  Cardiac Enzymes: No results for input(s): "CKTOTAL", "CKMB", "CKMBINDEX", "TROPONINI" in the last 168 hours.  BNP (last 3 results) No results for input(s): "PROBNP" in the last 8760 hours.  Lipid Profile: No results for input(s): "CHOL", "HDL", "LDLCALC", "TRIG", "CHOLHDL", "LDLDIRECT" in the last 72 hours.  Thyroid Function Tests: No results for input(s): "TSH", "T4TOTAL", "FREET4", "T3FREE", "THYROIDAB" in the last 72 hours.  Anemia Panel: No results for input(s): "VITAMINB12", "FOLATE", "FERRITIN", "TIBC", "IRON", "RETICCTPCT" in the last 72 hours.  Urine analysis:    Component Value Date/Time   COLORURINE YELLOW 03/26/2023 1810   APPEARANCEUR HAZY (A) 03/26/2023 1810   LABSPEC 1.011 03/26/2023 1810   PHURINE 8.0 03/26/2023 1810   GLUCOSEU 100 (A) 03/26/2023 1810   HGBUR TRACE (A) 03/26/2023 1810   BILIRUBINUR NEGATIVE 03/26/2023 1810   KETONESUR NEGATIVE 03/26/2023 1810   PROTEINUR 100 (A) 03/26/2023 1810   UROBILINOGEN 1.0 06/18/2013 1633   NITRITE NEGATIVE 03/26/2023 1810   LEUKOCYTESUR NEGATIVE 03/26/2023 1810    Sepsis Labs: Lactic Acid, Venous    Component Value Date/Time   LATICACIDVEN  2.9 (HH) 04/11/2023 2355    MICROBIOLOGY: Recent Results (from the past 240 hours)  Respiratory (~20 pathogens) panel by PCR     Status: Abnormal   Collection Time: 04/11/23  9:52 AM   Specimen: Nasopharyngeal Swab; Respiratory  Result Value Ref Range Status   Adenovirus NOT DETECTED NOT DETECTED Final   Coronavirus 229E NOT DETECTED NOT DETECTED Final    Comment: (NOTE) The Coronavirus on the Respiratory Panel, DOES NOT test for the novel  Coronavirus (2019 nCoV)    Coronavirus HKU1 NOT DETECTED NOT DETECTED Final   Coronavirus NL63 NOT DETECTED NOT DETECTED Final   Coronavirus OC43 NOT DETECTED NOT DETECTED Final   Metapneumovirus NOT DETECTED NOT DETECTED Final   Rhinovirus / Enterovirus NOT DETECTED NOT DETECTED Final   Influenza A H3 DETECTED (A) NOT DETECTED Final   Influenza B NOT DETECTED NOT DETECTED Final   Parainfluenza Virus 1 NOT DETECTED NOT DETECTED Final   Parainfluenza Virus 2 NOT DETECTED NOT DETECTED Final   Parainfluenza Virus 3 NOT DETECTED NOT DETECTED Final   Parainfluenza Virus 4 NOT DETECTED NOT DETECTED Final   Respiratory Syncytial Virus NOT DETECTED NOT DETECTED Final   Bordetella pertussis NOT DETECTED NOT DETECTED Final   Bordetella Parapertussis NOT DETECTED NOT DETECTED Final   Chlamydophila pneumoniae NOT DETECTED NOT DETECTED Final   Mycoplasma pneumoniae NOT DETECTED NOT DETECTED Final    Comment: Performed at [facility], [address], [place], [place] [address]  Resp panel by RT-PCR (RSV, Flu A&B, Covid) Nasopharyngeal Swab     Status: Abnormal   Collection Time: 04/11/23  9:52 AM   Specimen: Nasopharyngeal Swab; Nasal Swab  Result Value Ref Range Status   SARS Coronavirus 2 by RT PCR NEGATIVE NEGATIVE Final   Influenza A by PCR POSITIVE (A) NEGATIVE Final   Influenza B by PCR NEGATIVE NEGATIVE Final    Comment: (NOTE) The Xpert Xpress SARS-CoV-2/FLU/RSV plus assay is intended as an aid in the diagnosis of influenza from  Nasopharyngeal swab specimens and should not be used as a sole basis for treatment. Nasal washings and aspirates are unacceptable for Xpert Xpress SARS-CoV-2/FLU/RSV testing.  Fact Sheet for Patients: BloggerCourse.com  Fact Sheet for Healthcare Providers: SeriousBroker.it  This test is not yet approved or cleared by the [place] FDA and has been authorized for detection and/or diagnosis of SARS-CoV-2 by FDA under an Emergency Use Authorization (EUA). This EUA will remain in effect (meaning this test can be used) for the duration of the COVID-19 declaration under Section 564(b)(1) of the Act, 21 U.S.C. section 360bbb-3(b)(1), unless the authorization is terminated or revoked.     Resp Syncytial Virus by PCR NEGATIVE NEGATIVE Final    Comment: (NOTE) Fact Sheet for Patients: BloggerCourse.com  Fact Sheet for Healthcare Providers: SeriousBroker.it  This test is not yet approved or cleared by the [place] and has been authorized for detection and/or diagnosis of SARS-CoV-2 by FDA under an Emergency Use Authorization (EUA). This EUA will remain in effect (meaning this test can be used) for the duration of the COVID-19 declaration under Section 564(b)(1) of the Act, 21 U.S.C. section 360bbb-3(b)(1), unless the authorization is terminated or revoked.  Performed at [facility], [address], [place], [place] [address]   Culture, blood (Routine X 2) w Reflex to ID Panel     Status: None   Collection Time: 04/11/23  8:50 PM   Specimen: BLOOD  Result Value Ref Range Status   Specimen Description BLOOD SITE NOT SPECIFIED  Final   Special Requests   Final    BOTTLES DRAWN AEROBIC AND ANAEROBIC Blood Culture results may not be optimal due to an inadequate volume of blood received in culture bottles   Culture   Final    NO GROWTH 5 DAYS Performed at [facility], [address] [place], [place] [address]    Report Status 04/16/2023 FINAL  Final  Culture, blood (Routine X 2) w Reflex to ID Panel     Status: None (Preliminary result)   Collection Time: 04/11/23 11:52 PM   Specimen: BLOOD RIGHT HAND  Result Value Ref Range Status   Specimen Description BLOOD RIGHT HAND  Final   Special Requests   Final    BOTTLES DRAWN AEROBIC ONLY Blood Culture results may not be optimal due to an inadequate volume of blood received in culture bottles   Culture   Final    NO GROWTH 4 DAYS Performed at [facility], [address] [place], [place] [address]    Report Status PENDING  Incomplete    RADIOLOGY STUDIES/RESULTS: No results found.   LOS: 6 days   Jeoffrey Massed, MD  [facility]    To contact the attending provider between 7A-7P or the covering provider during after hours 7P-7A, please log into the web site [email or web address] and access using universal [place] password for that web site. If you do not have the password, please call the hospital operator.  04/16/2023, 1:53 PM

## 2023-04-16 NOTE — TOC CM/SW Note (Signed)
 Transition of Care [facility]) - Inpatient Brief Assessment   Patient Details  Name: KHELANI KOPS MRN: [id number] Date of Birth: 1935/08/16  Transition [facility]) CM/SW Contact:    Mearl Latin, LCSW Phone Number: 04/16/2023, 9:44 AM   Clinical Narrative: Patient admitted from home with DME and outpatient Dialysis. Recently ended home health services. TOC following for needs.    Transition of Care Asessment: Insurance and Status: Insurance coverage has been reviewed Patient has primary care physician: Yes Home environment has been reviewed: From home Prior level of function:: Mod Independent Prior/Current Home Services: No current home services Social Drivers of Health Review: SDOH reviewed no interventions necessary Readmission risk has been reviewed: Yes Transition of care needs: transition of care needs identified, TOC will continue to follow

## 2023-04-16 NOTE — Progress Notes (Signed)
 Occupational Therapy Treatment Patient Details Name: Kim Burns MRN: [id number] DOB: 1936/01/01 Today's Date: 04/16/2023   History of present illness 88 y.o. female presents to ED 2/20 secondary to severe sepsis, osteomyelitis and lower extremity cellulitis, complicated by recent influenza infection, hypotensive, developed respiratory failure in the setting of volume overload, requiring BiPAP support, significant improvement of respiratory status after 2 L ultrafiltration hemodialysis, significantly improved with antibiotics. PMH - afib, ESRD on HD, chf, DM, HTN, Whipple, gout, bradycardia, TIA, CVA.   OT comments  Pt presenting as very weak and lethargic compared to IE. Pt needing Max A with bed mobility and power into standing which is significantly more than before. With her level of fatigue she demonstrates little initiation. OT to continue following pt acutely to address listed deficits and help transition to next level of care. Discussed with pt the need for post acute rehab, updated DC recs to skilled rehab <3hrs of therapy and increased support.       If plan is discharge home, recommend the following:  A little help with walking and/or transfers;A little help with bathing/dressing/bathroom;Assist for transportation;Help with stairs or ramp for entrance;Assistance with cooking/housework   Equipment Recommendations  None recommended by OT    Recommendations for Other Services      Precautions / Restrictions Precautions Precautions: Fall Recall of Precautions/Restrictions: Intact Precaution/Restrictions Comments: Lt nephrostomy drain Required Braces or Orthoses: Other Brace Other Brace: L post-op shoe (Not found in room) Restrictions Weight Bearing Restrictions Per Provider Order: No LLE Weight Bearing Per Provider Order: Weight bearing as tolerated       Mobility Bed Mobility Overal bed mobility: Needs Assistance Bed Mobility: Supine to Sit, Sit to Supine     Supine  to sit: Max assist Sit to supine: Max assist   General bed mobility comments: Pt with little initiation trying to get to EOB, max A to scoot hips to EOB    Transfers Overall transfer level: Needs assistance Equipment used: Rollator (4 wheels) Transfers: Sit to/from Stand Sit to Stand: Max assist                 Balance Overall balance assessment: Needs assistance, History of Falls Sitting-balance support: No upper extremity supported, Feet supported Sitting balance-Leahy Scale: Fair     Standing balance support: During functional activity, Bilateral upper extremity supported, Reliant on assistive device for balance Standing balance-Leahy Scale: Poor                             ADL either performed or assessed with clinical judgement   ADL                                         General ADL Comments: Focused session on efforts of OOB mobility    Extremity/Trunk Assessment Upper Extremity Assessment Upper Extremity Assessment: Generalized weakness   Lower Extremity Assessment Lower Extremity Assessment: Generalized weakness   Cervical / Trunk Assessment Cervical / Trunk Assessment: Kyphotic    Vision       Perception     Praxis     Communication Communication Communication: No apparent difficulties;Impaired Factors Affecting Communication: Reduced clarity of speech   Cognition Arousal: Lethargic Behavior During Therapy: Flat affect Cognition: Difficult to assess Difficult to assess due to: Impaired communication           OT -  Cognition Comments: pt speaking in mumbled tone, impaired initiation of bed mobility                 Following commands: Impaired Following commands impaired: Follows one step commands with increased time      Cueing   Cueing Techniques: Verbal cues, Gestural cues  Exercises      Shoulder Instructions       General Comments VSS on 2L, Pt noted to be bleeding coming from IV site.  Notified RN and he came into the room to assess the situation    Pertinent Vitals/ Pain       Pain Assessment Pain Assessment: Faces Faces Pain Scale: No hurt  Home Living                                          Prior Functioning/Environment              Frequency  Min 1X/week        Progress Toward Goals  OT Goals(current goals can now be found in the care plan section)  Progress towards OT goals: Not progressing toward goals - comment (Pt very lethargic and weak during tx session)  Acute Rehab OT Goals OT Goal Formulation: With patient Time For Goal Achievement: 04/26/23 Potential to Achieve Goals: Good  Plan      Co-evaluation                 AM-PAC OT "6 Clicks" Daily Activity     Outcome Measure   Help from another person eating meals?: None Help from another person taking care of personal grooming?: A Little Help from another person toileting, which includes using toliet, bedpan, or urinal?: A Lot Help from another person bathing (including washing, rinsing, drying)?: A Little Help from another person to put on and taking off regular upper body clothing?: A Little Help from another person to put on and taking off regular lower body clothing?: A Little 6 Click Score: 18    End of Session Equipment Utilized During Treatment: Gait belt;Rollator (4 wheels)  OT Visit Diagnosis: Unsteadiness on feet (R26.81);History of falling (Z91.81)   Activity Tolerance Patient limited by lethargy;Patient limited by fatigue   Patient Left in bed;with call bell/phone within reach;with nursing/sitter in room   Nurse Communication Mobility status        Time: 1532-1550 OT Time Calculation (min): 18 min  Charges: OT General Charges $OT Visit: 1 Visit OT Treatments $Therapeutic Activity: 8-22 mins  04/16/2023  AB, OTR/L  Acute Rehabilitation Services  Office: [phone]   Tristan Schroeder 04/16/2023, 4:46 PM

## 2023-04-16 NOTE — TOC Progression Note (Signed)
 Transition of Care [facility]) - Progression Note    Patient Details  Name: Kim Burns MRN: [id number] Date of Birth: April 21, 1935  [facility]) CM/SW Contact  Jessie Foot, RN Phone Number: 04/16/2023, 11:09 AM  Clinical Narrative:    Went to discuss SNF placement at discharge. Patient requested I speak with son [name]) about starting process for SNF placement. Patient states she has DME ( 2-walkers, shower chair, cane & wheelchair).  Patient states, not on oxygen or CPAP at home.  Call [name] (son), agreeable to starting SNF placement process. [name] said he want to speak with his sister about overall care for his mom going forward. [name] requested CIR as his 1st choice, [facility]etta Manners for SNF as 2nd and potentially [facility] outpatient Rehab([name] said he can transport) since it is closer to where they reside. [name] said he would wanted to know if patient will have dialysis prior to discharge to be able to get back on a MWF schedule. Patient currently receive dialysis at [facility]. Informed [name] that dialysis would be dependent on patient need closer to discharge.  Will continue to follow for DME, SNF or outpatient rehab needs.     Expected Discharge Plan and Services                                               Social Determinants of Health (SDOH) Interventions SDOH Screenings   Food Insecurity: No Food Insecurity (04/12/2023)  Housing: Low Risk  (04/12/2023)  Transportation Needs: No Transportation Needs (04/12/2023)  Utilities: Not At Risk (04/12/2023)  Alcohol Screen: Low Risk  (01/08/2023)  Depression (PHQ2-9): Low Risk  (01/08/2023)  Financial Resource Strain: Low Risk  (04/02/2023)   Received from [facility]  Physical Activity: Inactive (02/06/2023)  Social Connections: Socially Isolated (04/12/2023)  Stress: No Stress Concern Present (01/08/2023)  Tobacco Use: Low Risk  (04/10/2023)  Health  Literacy: Adequate Health Literacy (01/08/2023)    Readmission Risk Interventions    04/16/2023    9:41 AM 01/02/2023    4:07 PM 10/30/2022    3:26 PM  Readmission Risk Prevention Plan  Transportation Screening Complete Complete Complete  Medication Review [employer]) Complete Referral to Pharmacy Complete  PCP or Specialist appointment within 3-5 days of discharge Complete    HRI or Home Care Consult Complete Complete Complete  SW Recovery Care/Counseling Consult Complete Complete Complete  Palliative Care Screening Not Applicable Not Applicable Not Applicable  Skilled Nursing Facility Not Applicable Not Applicable Not Applicable

## 2023-04-16 NOTE — Consult Note (Signed)
 [facility] Liaison Consult Note   04/16/2023  Kim Burns 05-Jun-1935 [id number]  [facility] Patient:  Active with [facility] RN Care Coordinator  Primary Care Provider:  Lewis Moccasin, MD, this provider is listed for post hospital follow up.  Insurance: [employer] Medicare  Patient is currently active with [facility] for care coordination services.  Patient has been engaged by a [employer]  The community based plan of care has focused on disease management and community resource support.    Plan: Continue to follow for any additional community care coordination needs for post hospital/community needs. Will update [facility] RN CC of hospitalization and potential SNF for rehab pending disposition and needs.    Of note, [facility] services does not replace or interfere with any services that are needed or arranged by inpatient [facility] care management team.   Charlesetta Shanks, RN, BSN, CCM [place]  [facility], [facility] Liaison Direct Dial: [phone] or secure chat Email: [email or web address]

## 2023-04-16 NOTE — [place] FL2 (Signed)
 [place] MEDICAID FL2 LEVEL OF CARE FORM     IDENTIFICATION  Patient Name: Kim Burns Birthdate: [birth year] Sex: female Admission Date (Current Location): 04/10/2023  [facility] and [place] Number:  Producer, television/film/video and Address:  The [place], [address], [place], [place] [address]      Provider Number: [id number]  Attending Physician Name and Address:  Maretta Bees, MD  Relative Name and Phone Number:       Current Level of Care: Hospital Recommended Level of Care: Skilled Nursing Facility Prior Approval Number:    Date Approved/Denied: 04/28/16 PASRR Number: [id number]  Discharge Plan: SNF    Current Diagnoses: Patient Active Problem List   Diagnosis Date Noted   Osteomyelitis of second toe of left foot (HCC) 04/12/2023   Acute osteomyelitis (HCC) 04/11/2023   Acute on chronic respiratory failure with hypoxia and hypercapnia (HCC) 04/11/2023   Generalized weakness 04/10/2023   Sterile pyuria 03/27/2023   Cellulitis of left foot 03/26/2023   Physical deconditioning 12/31/2022   Parkinson disease (HCC) 12/26/2022   Nephrostomy complication (HCC) 12/26/2022   Elevated LFTs 12/24/2022   Malnutrition of moderate degree 12/24/2022   Cerebrovascular disease 12/24/2022   OSA (obstructive sleep apnea) 12/24/2022   Hypoalbuminemia 12/24/2022   TBI (traumatic brain injury) (HCC) 10/31/2022   Lacunar infarct, acute (HCC) 10/27/2022   Acute systolic heart failure (HCC) 10/26/2022   Atrial fibrillation, rapid (HCC) 10/26/2022   Pressure injury of skin 10/26/2022   Traumatic brain injury (HCC) 10/21/2022   Contusion of right cerebral hemisphere (HCC) 10/21/2022   Closed skull fracture (HCC) 10/21/2022   Sacral fracture, closed (HCC) 10/21/2022   Exocrine pancreatic insufficiency 10/21/2022   Hypertension 10/21/2022   Hypomagnesemia 09/18/2022   RLS (restless legs syndrome) 09/18/2022   HSV (herpes simplex virus) dendritic  keratitis 09/18/2022   Transaminitis 09/18/2022   Atrial fibrillation with RVR (HCC) 09/14/2022   Essential hypertension 09/14/2022   Erythropoietin (EPO) stimulating agent anemia management patient 07/16/2022   Hematoma of left kidney 01/22/2022   Kidney, perinephric abscess 11/02/2021   Bacteremia due to Streptococcus 11/01/2021   Lactic acidosis 10/30/2021   Fever 10/30/2021   Severe sepsis (HCC) 10/29/2021   Hydronephrosis of left kidney 10/29/2021   Chronic diastolic CHF (congestive heart failure) (HCC) 10/29/2021   Coronary artery disease involving native coronary artery of native heart without angina pectoris 10/29/2021   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) 10/29/2021   Elevated troponin    Chest pain of uncertain etiology    Pulmonary nodule 08/26/2021   Paroxysmal atrial fibrillation (HCC) 08/26/2021   ESRD (end stage renal disease) (HCC) 08/25/2021   Renal cell carcinoma of left kidney (HCC) 08/22/2021   DM2 (diabetes mellitus, type 2) (HCC) 11/14/2016   Hypokalemia 11/12/2016   Melena    History of anemia due to chronic kidney disease    Acute renal failure (ARF) (HCC) 10/03/2016   GERD (gastroesophageal reflux disease) 04/29/2016   Hyperglycemia 04/29/2016   IPMN (intraductal papillary mucinous neoplasm) 04/23/2016   History of stroke 04/16/2016   Sepsis (HCC) 06/18/2013   Localized swelling, mass and lump, neck 03/29/2013   Sinus bradycardia 09/18/2012   Fatigue 09/18/2012    Orientation RESPIRATION BLADDER Height & Weight     Self, Time, Situation, Place  O2 ([place] 1L) Continent Weight: 50.4 kg Height:     BEHAVIORAL SYMPTOMS/MOOD NEUROLOGICAL BOWEL NUTRITION STATUS      Continent Diet (see discharge summary)  AMBULATORY STATUS COMMUNICATION OF  NEEDS Skin   Limited Assist Verbally Bruising                       Personal Care Assistance Level of Assistance  Bathing, Feeding, Dressing Bathing Assistance: Limited assistance Feeding  assistance: Limited assistance Dressing Assistance: Limited assistance     Functional Limitations Info             SPECIAL CARE FACTORS FREQUENCY  PT (By licensed PT), OT (By licensed OT)     PT Frequency: 5X weekly OT Frequency: 5X weekly            Contractures Contractures Info: Not present    Additional Factors Info  Code Status, Allergies, Insulin Sliding Scale, Isolation Precautions Code Status Info: Full Allergies Info: Lopid (gemfibrozil) Lotemax (loteprednol Etabonate) Rosuvastatin  Norco (hydrocodone-acetaminophen) Vibra-tab (doxycycline) Amlodipine Norvasc  Codeine  Hydrocodone Low  Other (See Comments)   Hydrocodone-acetaminophen  Lisinopril   Verapamil   Insulin Sliding Scale Info: insulin aspart (NOVOLOG FLEXPEN) 100 UNIT/ML FlexPen Inject 5-7 Units Isolation Precautions Info: Influence A -  airborne     Current Medications (04/16/2023):  This is the current hospital active medication list Current Facility-Administered Medications  Medication Dose Route Frequency Provider Last Rate Last Admin   acetaminophen (TYLENOL) tablet 650 mg  650 mg Oral Q6H PRN Howerter, Justin B, DO   650 mg at 04/16/23 0901   Or   acetaminophen (TYLENOL) suppository 650 mg  650 mg Rectal Q6H PRN Howerter, Justin B, DO       albumin human 25 % solution 25 g  25 g Intravenous Q1H PRN Delano Metz, MD 60 mL/hr at 04/11/23 1627 25 g at 04/11/23 1627   amiodarone (NEXTERONE PREMIX) 360-4.14 MG/200ML-% (1.8 mg/mL) IV infusion  30 mg/hr Intravenous Continuous Crosley, Debby, MD 16.67 mL/hr at 04/16/23 0315 30 mg/hr at 04/16/23 0315   cefTRIAXone (ROCEPHIN) 2 g in sodium chloride 0.9 % 100 mL IVPB  2 g Intravenous Q24H Elgergawy, Leana Roe, MD 200 mL/hr at 04/15/23 2104 2 g at 04/15/23 2104   Chlorhexidine Gluconate Cloth 2 % PADS 6 each  6 each Topical Q0600 Berenda Morale, NP   6 each at 04/16/23 0502   diltiazem (CARDIZEM) injection 10 mg  10 mg Intravenous Q4H PRN Elgergawy, Leana Roe, MD       doxercalciferol (HECTOROL) injection 3 mcg  3 mcg Intravenous Q M,W,F-HD Delano Metz, MD   3 mcg at 04/11/23 1627   doxycycline (VIBRAMYCIN) 100 mg in sodium chloride 0.9 % 250 mL IVPB  100 mg Intravenous BID Elgergawy, Leana Roe, MD 125 mL/hr at 04/16/23 0901 100 mg at 04/16/23 0901   heparin injection 5,000 Units  5,000 Units Subcutaneous Q8H Elgergawy, Leana Roe, MD   5,000 Units at 04/12/23 2139   lipase/protease/amylase (CREON) capsule 36,000 Units  36,000 Units Oral TID WC Howerter, Justin B, DO   36,000 Units at 04/16/23 0901   lipase/protease/amylase (CREON) capsule 36,000 Units  36,000 Units Oral PRN Howerter, Justin B, DO   36,000 Units at 04/11/23 2314   melatonin tablet 5 mg  5 mg Oral QHS PRN Gery Pray, MD   5 mg at 04/15/23 2102   metoprolol tartrate (LOPRESSOR) injection 5 mg  5 mg Intravenous Q4H PRN Elgergawy, Leana Roe, MD       metoprolol tartrate (LOPRESSOR) tablet 25 mg  25 mg Oral BID Elgergawy, Leana Roe, MD   25 mg at 04/16/23 0901   midodrine (PROAMATINE)  tablet 5 mg  5 mg Oral TID WC Pola Corn, NP   5 mg at 04/16/23 0901   multivitamin (RENA-VIT) tablet 1 tablet  1 tablet Oral QHS Delano Metz, MD   1 tablet at 04/15/23 2103   ondansetron [facility]) injection 4 mg  4 mg Intravenous Q6H PRN Howerter, Justin B, DO   4 mg at 04/16/23 0202   oseltamivir (TAMIFLU) capsule 30 mg  30 mg Oral Daily Paytes, Austin A, RPH   30 mg at 04/15/23 1812   sevelamer carbonate (RENVELA) tablet 800 mg  800 mg Oral TID WC Howerter, Justin B, DO   800 mg at 04/16/23 0901   sevelamer carbonate (RENVELA) tablet 800 mg  800 mg Oral PRN Howerter, Chaney Born, DO         Discharge Medications: Please see discharge summary for a list of discharge medications.  Relevant Imaging Results:  Relevant Lab Results:   Additional Information SS# [id number]  Pt out pt HD ([facility]) , [place]  Joven Mom, Smith Robert, RN

## 2023-04-16 NOTE — Plan of Care (Signed)
 Problem: Education: Goal: Ability to describe self-care measures that may prevent or decrease complications (Diabetes Survival Skills Education) will improve 04/16/2023 0443 by Azucena Cecil, RN Outcome: Progressing 04/16/2023 0443 by Azucena Cecil, RN Outcome: Progressing Goal: Individualized Educational Video(s) 04/16/2023 0443 by Azucena Cecil, RN Outcome: Progressing 04/16/2023 0443 by Azucena Cecil, RN Outcome: Progressing   Problem: Coping: Goal: Ability to adjust to condition or change in health will improve 04/16/2023 0443 by Azucena Cecil, RN Outcome: Progressing 04/16/2023 0443 by Azucena Cecil, RN Outcome: Progressing   Problem: Fluid Volume: Goal: Ability to maintain a balanced intake and output will improve 04/16/2023 0443 by Azucena Cecil, RN Outcome: Progressing 04/16/2023 0443 by Azucena Cecil, RN Outcome: Progressing   Problem: Health Behavior/Discharge Planning: Goal: Ability to identify and utilize available resources and services will improve 04/16/2023 0443 by Azucena Cecil, RN Outcome: Progressing 04/16/2023 0443 by Azucena Cecil, RN Outcome: Progressing Goal: Ability to manage health-related needs will improve 04/16/2023 0443 by Azucena Cecil, RN Outcome: Progressing 04/16/2023 0443 by Azucena Cecil, RN Outcome: Progressing   Problem: Metabolic: Goal: Ability to maintain appropriate glucose levels will improve 04/16/2023 0443 by Azucena Cecil, RN Outcome: Progressing 04/16/2023 0443 by Azucena Cecil, RN Outcome: Progressing   Problem: Nutritional: Goal: Maintenance of adequate nutrition will improve 04/16/2023 0443 by Azucena Cecil, RN Outcome: Progressing 04/16/2023 0443 by Azucena Cecil, RN Outcome: Progressing Goal: Progress toward achieving an optimal weight will improve 04/16/2023 0443 by Azucena Cecil, RN Outcome: Progressing 04/16/2023 0443 by Azucena Cecil, RN Outcome:  Progressing   Problem: Skin Integrity: Goal: Risk for impaired skin integrity will decrease 04/16/2023 0443 by Azucena Cecil, RN Outcome: Progressing 04/16/2023 0443 by Azucena Cecil, RN Outcome: Progressing   Problem: Tissue Perfusion: Goal: Adequacy of tissue perfusion will improve 04/16/2023 0443 by Azucena Cecil, RN Outcome: Progressing 04/16/2023 0443 by Azucena Cecil, RN Outcome: Progressing   Problem: Education: Goal: Knowledge of General Education information will improve Description: Including pain rating scale, medication(s)/side effects and non-pharmacologic comfort measures 04/16/2023 0443 by Azucena Cecil, RN Outcome: Progressing 04/16/2023 0443 by Azucena Cecil, RN Outcome: Progressing   Problem: Health Behavior/Discharge Planning: Goal: Ability to manage health-related needs will improve 04/16/2023 0443 by Azucena Cecil, RN Outcome: Progressing 04/16/2023 0443 by Azucena Cecil, RN Outcome: Progressing   Problem: Clinical Measurements: Goal: Ability to maintain clinical measurements within normal limits will improve 04/16/2023 0443 by Azucena Cecil, RN Outcome: Progressing 04/16/2023 0443 by Azucena Cecil, RN Outcome: Progressing Goal: Will remain free from infection 04/16/2023 0443 by Azucena Cecil, RN Outcome: Progressing 04/16/2023 0443 by Azucena Cecil, RN Outcome: Progressing Goal: Diagnostic test results will improve 04/16/2023 0443 by Azucena Cecil, RN Outcome: Progressing 04/16/2023 0443 by Azucena Cecil, RN Outcome: Progressing Goal: Respiratory complications will improve 04/16/2023 0443 by Azucena Cecil, RN Outcome: Progressing 04/16/2023 0443 by Azucena Cecil, RN Outcome: Progressing Goal: Cardiovascular complication will be avoided 04/16/2023 0443 by Azucena Cecil, RN Outcome: Progressing 04/16/2023 0443 by Azucena Cecil, RN Outcome: Progressing   Problem: Activity: Goal: Risk  for activity intolerance will decrease 04/16/2023 0443 by Azucena Cecil, RN Outcome: Progressing 04/16/2023 0443 by Azucena Cecil, RN Outcome: Progressing   Problem: Nutrition: Goal: Adequate nutrition will be maintained 04/16/2023 0443 by Azucena Cecil, RN Outcome: Progressing 04/16/2023 0443 by Azucena Cecil, RN Outcome: Progressing  Problem: Coping: Goal: Level of anxiety will decrease 04/16/2023 0443 by Azucena Cecil, RN Outcome: Progressing 04/16/2023 0443 by Azucena Cecil, RN Outcome: Progressing   Problem: Elimination: Goal: Will not experience complications related to bowel motility 04/16/2023 0443 by Azucena Cecil, RN Outcome: Progressing 04/16/2023 0443 by Azucena Cecil, RN Outcome: Progressing Goal: Will not experience complications related to urinary retention 04/16/2023 0443 by Azucena Cecil, RN Outcome: Progressing 04/16/2023 0443 by Azucena Cecil, RN Outcome: Progressing   Problem: Pain Managment: Goal: General experience of comfort will improve and/or be controlled 04/16/2023 0443 by Azucena Cecil, RN Outcome: Progressing 04/16/2023 0443 by Azucena Cecil, RN Outcome: Progressing   Problem: Safety: Goal: Ability to remain free from injury will improve 04/16/2023 0443 by Azucena Cecil, RN Outcome: Progressing 04/16/2023 0443 by Azucena Cecil, RN Outcome: Progressing   Problem: Skin Integrity: Goal: Risk for impaired skin integrity will decrease 04/16/2023 0443 by Azucena Cecil, RN Outcome: Progressing 04/16/2023 0443 by Azucena Cecil, RN Outcome: Progressing

## 2023-04-17 DIAGNOSIS — I4821 Permanent atrial fibrillation: Secondary | ICD-10-CM | POA: Diagnosis not present

## 2023-04-17 DIAGNOSIS — N186 End stage renal disease: Secondary | ICD-10-CM | POA: Diagnosis not present

## 2023-04-17 DIAGNOSIS — I480 Paroxysmal atrial fibrillation: Secondary | ICD-10-CM | POA: Diagnosis not present

## 2023-04-17 DIAGNOSIS — R531 Weakness: Secondary | ICD-10-CM | POA: Diagnosis not present

## 2023-04-17 DIAGNOSIS — A419 Sepsis, unspecified organism: Secondary | ICD-10-CM | POA: Diagnosis not present

## 2023-04-17 LAB — CULTURE, BLOOD (ROUTINE X 2): Culture: NO GROWTH

## 2023-04-17 LAB — CBC
HCT: 41.2 % (ref 36.0–46.0)
Hemoglobin: 12.6 g/dL (ref 12.0–15.0)
MCH: 27.8 pg (ref 26.0–34.0)
MCHC: 30.6 g/dL (ref 30.0–36.0)
MCV: 90.9 fL (ref 80.0–100.0)
Platelets: 201 10*3/uL (ref 150–400)
RBC: 4.53 MIL/uL (ref 3.87–5.11)
RDW: 21.4 % — ABNORMAL HIGH (ref 11.5–15.5)
WBC: 13.4 10*3/uL — ABNORMAL HIGH (ref 4.0–10.5)
nRBC: 0.9 % — ABNORMAL HIGH (ref 0.0–0.2)

## 2023-04-17 LAB — GLUCOSE, CAPILLARY
Glucose-Capillary: 110 mg/dL — ABNORMAL HIGH (ref 70–99)
Glucose-Capillary: 140 mg/dL — ABNORMAL HIGH (ref 70–99)
Glucose-Capillary: 128 mg/dL — ABNORMAL HIGH (ref 70–99)
Glucose-Capillary: 92 mg/dL (ref 70–99)

## 2023-04-17 LAB — RENAL FUNCTION PANEL
Albumin: 2.6 g/dL — ABNORMAL LOW (ref 3.5–5.0)
Anion gap: 21 — ABNORMAL HIGH (ref 5–15)
BUN: 40 mg/dL — ABNORMAL HIGH (ref 8–23)
CO2: 19 mmol/L — ABNORMAL LOW (ref 22–32)
Calcium: 9.9 mg/dL (ref 8.9–10.3)
Chloride: 90 mmol/L — ABNORMAL LOW (ref 98–111)
Creatinine, Ser: 4.55 mg/dL — ABNORMAL HIGH (ref 0.44–1.00)
GFR, Estimated: 9 mL/min — ABNORMAL LOW (ref >60)
Glucose, Bld: 114 mg/dL — ABNORMAL HIGH (ref 70–99)
Phosphorus: 8.1 mg/dL — ABNORMAL HIGH (ref 2.5–4.6)
Potassium: 4.9 mmol/L (ref 3.5–5.1)
Sodium: 130 mmol/L — ABNORMAL LOW (ref 135–145)

## 2023-04-17 MED ORDER — HEPARIN SODIUM (PORCINE) 1000 UNIT/ML IJ SOLN
INTRAMUSCULAR | Status: AC
  Administered 2023-04-17: 1000 [IU]
  Filled 2023-04-17: qty 4

## 2023-04-17 MED ORDER — BENZONATATE 100 MG PO CAPS
200.0000 mg | ORAL_CAPSULE | Freq: Three times a day (TID) | ORAL | Status: DC | PRN
  Administered 2023-04-17: 200 mg via ORAL
  Filled 2023-04-17: qty 2

## 2023-04-17 NOTE — TOC Progression Note (Signed)
 Transition of Care [facility]) - Progression Note    Patient Details  Name: Kim Burns MRN: [id number] Date of Birth: 1935-04-19  [facility] CM/SW Contact  Jessie Foot, RN Phone Number: 04/17/2023, 4:46 PM  Clinical Narrative:    Son [name]) returned call. Update him that patient will be discharged tomorrow (2/28) to [facility] via [place].    Barriers to Discharge: Continued Medical Work up, English as a second language teacher, SNF Pending bed offer  Expected Discharge Plan and Services In-house Referral: Clinical Social Work Discharge Planning Services: CM Consult   Living arrangements for the past 2 months: Single Family Home                 DME Arranged: N/A                     Social Determinants of Health (SDOH) Interventions SDOH Screenings   Food Insecurity: No Food Insecurity (04/12/2023)  Housing: Low Risk  (04/12/2023)  Transportation Needs: No Transportation Needs (04/12/2023)  Utilities: Not At Risk (04/12/2023)  Alcohol Screen: Low Risk  (01/08/2023)  Depression (PHQ2-9): Low Risk  (01/08/2023)  Financial Resource Strain: Low Risk  (04/02/2023)   Received from [facility]  Physical Activity: Inactive (02/06/2023)  Social Connections: Socially Isolated (04/12/2023)  Stress: No Stress Concern Present (01/08/2023)  Tobacco Use: Low Risk  (04/10/2023)  Health Literacy: Adequate Health Literacy (01/08/2023)    Readmission Risk Interventions    04/16/2023    9:41 AM 01/02/2023    4:07 PM 10/30/2022    3:26 PM  Readmission Risk Prevention Plan  Transportation Screening Complete Complete Complete  Medication Review [employer]) Complete Referral to Pharmacy Complete  PCP or Specialist appointment within 3-5 days of discharge Complete    HRI or Home Care Consult Complete Complete Complete  SW Recovery Care/Counseling Consult Complete Complete Complete  Palliative Care Screening Not Applicable Not Applicable Not Applicable   Skilled Nursing Facility Not Applicable Not Applicable Not Applicable

## 2023-04-17 NOTE — Progress Notes (Signed)
 PT Cancellation Note  Patient Details Name: Kim Burns MRN: [id number] DOB: 1935-03-19   Cancelled Treatment:    Reason Eval/Treat Not Completed: Patient at procedure or test/unavailable  Patient off the unit at dialysis. Will attempt to see upon her return to the unit.    Jerolyn Center, PT Acute Rehabilitation Services  Office [phone]   Zena Amos 04/17/2023, 12:07 PM

## 2023-04-17 NOTE — Progress Notes (Signed)
 PROGRESS NOTE        PATIENT DETAILS Name: Kim Burns Age: 88 y.o. Sex: female Date of Birth: 01/12/1936 Admit Date: 04/10/2023 Admitting Physician Angie Fava, DO WJX:BJYNW, Christell Constant, MD  Brief Summary: Patient is a 88 y.o.  female with history of ESRD on HD MWF, PAF not on anticoagulation, HTN, DM-2, chronic HFpEF-who presented with shortness of breath-found to have influenza A infection with acute hypoxic/hypercarbic respiratory failure-briefly required BiPAP.  See below for further details.  Significant events: 2/20>> admit to [facility]  Significant studies: 2/20>> CXR: Increasing left basilar atelectasis/infiltrate. 2/21>> MRI left foot: Acute osteomyelitis-proximal phalanx second toe.  Significant microbiology data: 2/21>> blood culture: No growth 2/21>> influenza A: Positive 2/21>> COVID/influenza B/RSV PCR: Negative  Procedures: None  Consults: Nephrology Cardiology  Subjective: Looks a bit better than the past several days-awake/alert.   Objective: Vitals: Blood pressure (!) 112/95, pulse (!) 115, temperature 98.8 F (37.1 C), resp. rate 19, weight 54.8 kg, SpO2 100%.   Exam: Gen Exam:Alert awake-not in any distress HEENT:atraumatic, normocephalic Chest: B/L clear to auscultation anteriorly CVS:S1S2 regular Abdomen:soft non tender, non distended Extremities:no edema Neurology: Non focal-generalized weakness. Skin: no rash  Pertinent Labs/Radiology:    Latest Ref Rng & Units 04/17/2023    9:38 AM 04/16/2023   10:49 AM 04/14/2023    5:14 AM  CBC  WBC 4.0 - 10.5 K/uL 13.4  12.7  13.0   Hemoglobin 12.0 - 15.0 g/dL 29.5  62.1  30.8   Hematocrit 36.0 - 46.0 % 41.2  41.2  35.3   Platelets 150 - 400 K/uL 201  193  199     Lab Results  Component Value Date   NA 130 (L) 04/17/2023   K 4.9 04/17/2023   CL 90 (L) 04/17/2023   CO2 19 (L) 04/17/2023     Assessment/Plan: Acute hypoxic/hypercarbic respiratory failure due  to influenza A pneumonia and HFpEF exacerbation Required BiPAP initially-much improved with bronchodilators/Tamiflu/dialysis-either on room air or on 1-2 L of oxygen Continue supportive care and continue to treat underlying etiologies.    Severe sepsis secondary to left foot cellulitis/acute second toe osteomyelitis/influenza infection with PNA Sepsis physiology improved All cultures negative so far Per Dr. Carloyn Jaeger does not need acute surgical intervention/treatment-per Dr. Briscoe Deutscher not need antibiotics-recommendations are to pursue wound care and outpatient follow-up.  Has completed almost 7 days of Rocephin/cefepime-has been on 4 days of doxycycline as well-suspect we can now discontinue.  Acute metabolic encephalopathy Overall improved-etiology likely due to hypoxia/hypercarbia/sepsis.  Persistent A-fib with RVR Rate controlled with amiodarone infusion-remains on beta-blocker Not on anticoagulation given history of bleeding in the past Cardiology now following and directing care.  Elevated troponins-likely due to demand ischemia Cardiology recommending supportive care.  ESRD on HD MWF Nephrology following  DM-2 with hypoglycemia (A1c 5.4 on 2/6) No further episodes of hypoglycemia Not on any oral hypoglycemics as an outpatient.  Recent Labs    04/16/23 1530 04/16/23 2141 04/17/23 0846  GLUCAP 146* 107* 110*    History of intraductal papillary mucinous neoplasm of the pancreas-s/p Whipple's Creon  History of renal cell carcinoma-s/p ablation-complicated by urine leak-s/p nephrostomy tube. Continue patient follow-up with IR for exchange.  Debility/deconditioning SNF versus home health-TOC team following.  Code status:   Code Status: Full Code   DVT Prophylaxis: Place and maintain sequential compression device Start: 04/15/23  0210 heparin injection 5,000 Units Start: 04/12/23 2200 SCDs Start: 04/10/23 2255   Family Communication: Son-[name]  Padovano-[phone] on 2/27   Disposition Plan: Status is: Inpatient Remains inpatient appropriate because: Severity of illness   Planned Discharge Destination:Skilled nursing facility vs HHPT   Diet: Diet Order             Diet renal with fluid restriction Fluid restriction: 1800 mL Fluid; Room service appropriate? Yes; Fluid consistency: Thin  Diet effective now                     Antimicrobial agents: Anti-infectives (From admission, onward)    Start     Dose/Rate Route Frequency Ordered Stop   04/15/23 1800  oseltamivir (TAMIFLU) capsule 30 mg       Note to Pharmacy: Tamiflu 30 mg  q48h for CrCl <  10 mL/min   30 mg Oral Daily 04/15/23 0926 04/16/23 1808   04/14/23 2000  oseltamivir (TAMIFLU) capsule 30 mg  Status:  Discontinued       Note to Pharmacy: Tamiflu 30 mg  q48h for CrCl <  10 mL/min   30 mg Oral Every M-W-F (2000) 04/14/23 0749 04/15/23 0926   04/13/23 2200  cefTRIAXone (ROCEPHIN) 2 g in sodium chloride 0.9 % 100 mL IVPB        2 g 200 mL/hr over 30 Minutes Intravenous Every 24 hours 04/13/23 0744     04/13/23 1000  oseltamivir (TAMIFLU) capsule 30 mg  Status:  Discontinued       Note to Pharmacy: Tamiflu 30 mg  q48h for CrCl <  10 mL/min   30 mg Oral Every 48 hours 04/11/23 2356 04/14/23 0749   04/13/23 0845  doxycycline (VIBRAMYCIN) 100 mg in sodium chloride 0.9 % 250 mL IVPB        100 mg 125 mL/hr over 120 Minutes Intravenous 2 times daily 04/13/23 0757     04/12/23 2200  vancomycin (VANCOREADY) IVPB 500 mg/100 mL  Status:  Discontinued        500 mg 100 mL/hr over 60 Minutes Intravenous Every 48 hours 04/10/23 2107 04/13/23 0743   04/12/23 0100  oseltamivir (TAMIFLU) capsule 30 mg  Status:  Discontinued       Note to Pharmacy: Tamiflu 30 mg  q48h for CrCl <  10 mL/min   30 mg Oral  Once 04/12/23 0007 04/14/23 0735   04/10/23 2130  ceFEPIme (MAXIPIME) 1 g in sodium chloride 0.9 % 100 mL IVPB  Status:  Discontinued        1 g 200 mL/hr over 30  Minutes Intravenous Every 24 hours 04/10/23 2107 04/13/23 0743   04/10/23 2045  vancomycin (VANCOCIN) IVPB 1000 mg/200 mL premix        1,000 mg 200 mL/hr over 60 Minutes Intravenous  Once 04/10/23 2039 04/10/23 2243        MEDICATIONS: Scheduled Meds:  amiodarone  400 mg Oral BID   Chlorhexidine Gluconate Cloth  6 each Topical Q0600   doxercalciferol  3 mcg Intravenous Q M,W,F-HD   heparin injection (subcutaneous)  5,000 Units Subcutaneous Q8H   lipase/protease/amylase  36,000 Units Oral TID WC   metoprolol tartrate  25 mg Oral BID   midodrine  5 mg Oral TID WC   multivitamin  1 tablet Oral QHS   sevelamer carbonate  800 mg Oral TID WC   Continuous Infusions:  albumin human 25 g (04/11/23 1627)   cefTRIAXone (ROCEPHIN)  IV 2 g (  04/16/23 2122)   doxycycline (VIBRAMYCIN) IV 100 mg (04/16/23 2314)   PRN Meds:.acetaminophen **OR** acetaminophen, albumin human, diltiazem, lipase/protease/amylase, melatonin, metoprolol tartrate, ondansetron (ZOFRAN) IV, sevelamer carbonate   I have personally reviewed following labs and imaging studies  LABORATORY DATA: CBC: Recent Labs  Lab 04/10/23 1826 04/10/23 2035 04/11/23 2050 04/13/23 0555 04/14/23 0514 04/16/23 1049 04/17/23 0938  WBC 13.3*   < > 16.4* 12.8* 13.0* 12.7* 13.4*  NEUTROABS 11.8*  --   --   --  12.2*  --   --   HGB 11.0*   < > 11.1* 10.7* 10.7* 12.6 12.6  HCT 37.6   < > 37.2 36.6 35.3* 41.2 41.2  MCV 97.2   < > 94.7 95.8 93.6 91.4 90.9  PLT 198   < > 235 192 199 193 201   < > = values in this interval not displayed.    Basic Metabolic Panel: Recent Labs  Lab 04/10/23 1826 04/10/23 2035 04/11/23 0515 04/11/23 0607 04/11/23 1002 04/13/23 0555 04/14/23 0514 04/15/23 0456 04/16/23 0847 04/17/23 0938  NA 139   < > 139   < > 138 135 131*  --  130* 130*  K 3.7   < > 4.1   < > 4.1 4.5 5.2*  --  4.9 4.9  CL 98  --  99  --   --  92* 93*  --  90* 90*  CO2 21*  --  22  --   --  23 20*  --  19* 19*  GLUCOSE 208*   --  107*  --   --  115* 172*  --  116* 114*  BUN 34*  --  35*  --   --  34* 46*  --  34* 40*  CREATININE 3.83*  --  3.88*  --   --  3.91* 4.78*  --  3.77* 4.55*  CALCIUM 8.1*  --  8.1*  --   --  9.9 9.1  --  9.4 9.9  MG 2.3  --  2.4  --   --   --   --  2.5*  --   --   PHOS  --   --  7.6*  --   --  7.7* 7.4*  --  6.4* 8.1*   < > = values in this interval not displayed.    GFR: Estimated Creatinine Clearance: 7.5 mL/min (A) (by C-G formula based on SCr of 4.55 mg/dL (H)).  Liver Function Tests: Recent Labs  Lab 04/10/23 1826 04/11/23 0515 04/13/23 0555 04/14/23 0514 04/16/23 0847 04/17/23 0938  AST 36 24  --   --   --   --   ALT 31 29  --   --   --   --   ALKPHOS 65 61  --   --   --   --   BILITOT 1.3* 1.2  --   --   --   --   PROT 5.9* 5.4*  --   --   --   --   ALBUMIN 2.5* 2.3* 3.1* 2.7* 2.7* 2.6*   No results for input(s): "LIPASE", "AMYLASE" in the last 168 hours. No results for input(s): "AMMONIA" in the last 168 hours.  Coagulation Profile: No results for input(s): "INR", "PROTIME" in the last 168 hours.  Cardiac Enzymes: No results for input(s): "CKTOTAL", "CKMB", "CKMBINDEX", "TROPONINI" in the last 168 hours.  BNP (last 3 results) No results for input(s): "PROBNP" in the last 8760 hours.  Lipid  Profile: No results for input(s): "CHOL", "HDL", "LDLCALC", "TRIG", "CHOLHDL", "LDLDIRECT" in the last 72 hours.  Thyroid Function Tests: No results for input(s): "TSH", "T4TOTAL", "FREET4", "T3FREE", "THYROIDAB" in the last 72 hours.  Anemia Panel: No results for input(s): "VITAMINB12", "FOLATE", "FERRITIN", "TIBC", "IRON", "RETICCTPCT" in the last 72 hours.  Urine analysis:    Component Value Date/Time   COLORURINE YELLOW 03/26/2023 1810   APPEARANCEUR HAZY (A) 03/26/2023 1810   LABSPEC 1.011 03/26/2023 1810   PHURINE 8.0 03/26/2023 1810   GLUCOSEU 100 (A) 03/26/2023 1810   HGBUR TRACE (A) 03/26/2023 1810   BILIRUBINUR NEGATIVE 03/26/2023 1810   KETONESUR  NEGATIVE 03/26/2023 1810   PROTEINUR 100 (A) 03/26/2023 1810   UROBILINOGEN 1.0 06/18/2013 1633   NITRITE NEGATIVE 03/26/2023 1810   LEUKOCYTESUR NEGATIVE 03/26/2023 1810    Sepsis Labs: Lactic Acid, Venous    Component Value Date/Time   LATICACIDVEN 2.9 (HH) 04/11/2023 2355    MICROBIOLOGY: Recent Results (from the past 240 hours)  Respiratory (~20 pathogens) panel by PCR     Status: Abnormal   Collection Time: 04/11/23  9:52 AM   Specimen: Nasopharyngeal Swab; Respiratory  Result Value Ref Range Status   Adenovirus NOT DETECTED NOT DETECTED Final   Coronavirus 229E NOT DETECTED NOT DETECTED Final    Comment: (NOTE) The Coronavirus on the Respiratory Panel, DOES NOT test for the novel  Coronavirus (2019 nCoV)    Coronavirus HKU1 NOT DETECTED NOT DETECTED Final   Coronavirus NL63 NOT DETECTED NOT DETECTED Final   Coronavirus OC43 NOT DETECTED NOT DETECTED Final   Metapneumovirus NOT DETECTED NOT DETECTED Final   Rhinovirus / Enterovirus NOT DETECTED NOT DETECTED Final   Influenza A H3 DETECTED (A) NOT DETECTED Final   Influenza B NOT DETECTED NOT DETECTED Final   Parainfluenza Virus 1 NOT DETECTED NOT DETECTED Final   Parainfluenza Virus 2 NOT DETECTED NOT DETECTED Final   Parainfluenza Virus 3 NOT DETECTED NOT DETECTED Final   Parainfluenza Virus 4 NOT DETECTED NOT DETECTED Final   Respiratory Syncytial Virus NOT DETECTED NOT DETECTED Final   Bordetella pertussis NOT DETECTED NOT DETECTED Final   Bordetella Parapertussis NOT DETECTED NOT DETECTED Final   Chlamydophila pneumoniae NOT DETECTED NOT DETECTED Final   Mycoplasma pneumoniae NOT DETECTED NOT DETECTED Final    Comment: Performed at [facility], [address], [place], [place] [address]  Resp panel by RT-PCR (RSV, Flu A&B, Covid) Nasopharyngeal Swab     Status: Abnormal   Collection Time: 04/11/23  9:52 AM   Specimen: Nasopharyngeal Swab; Nasal Swab  Result Value Ref Range Status   SARS Coronavirus 2 by  RT PCR NEGATIVE NEGATIVE Final   Influenza A by PCR POSITIVE (A) NEGATIVE Final   Influenza B by PCR NEGATIVE NEGATIVE Final    Comment: (NOTE) The Xpert Xpress SARS-CoV-2/FLU/RSV plus assay is intended as an aid in the diagnosis of influenza from Nasopharyngeal swab specimens and should not be used as a sole basis for treatment. Nasal washings and aspirates are unacceptable for Xpert Xpress SARS-CoV-2/FLU/RSV testing.  Fact Sheet for Patients: BloggerCourse.com  Fact Sheet for Healthcare Providers: SeriousBroker.it  This test is not yet approved or cleared by the [place] FDA and has been authorized for detection and/or diagnosis of SARS-CoV-2 by FDA under an Emergency Use Authorization (EUA). This EUA will remain in effect (meaning this test can be used) for the duration of the COVID-19 declaration under Section 564(b)(1) of the Act, 21 U.S.C. section 360bbb-3(b)(1), unless  the authorization is terminated or revoked.     Resp Syncytial Virus by PCR NEGATIVE NEGATIVE Final    Comment: (NOTE) Fact Sheet for Patients: BloggerCourse.com  Fact Sheet for Healthcare Providers: SeriousBroker.it  This test is not yet approved or cleared by the [place] FDA and has been authorized for detection and/or diagnosis of SARS-CoV-2 by FDA under an Emergency Use Authorization (EUA). This EUA will remain in effect (meaning this test can be used) for the duration of the COVID-19 declaration under Section 564(b)(1) of the Act, 21 U.S.C. section 360bbb-3(b)(1), unless the authorization is terminated or revoked.  Performed at [facility], [address] [place], [place] [address]   Culture, blood (Routine X 2) w Reflex to ID Panel     Status: None   Collection Time: 04/11/23  8:50 PM   Specimen: BLOOD  Result Value Ref Range Status   Specimen Description BLOOD SITE NOT  SPECIFIED  Final   Special Requests   Final    BOTTLES DRAWN AEROBIC AND ANAEROBIC Blood Culture results may not be optimal due to an inadequate volume of blood received in culture bottles   Culture   Final    NO GROWTH 5 DAYS Performed at [facility], [address], [place], [place] [address]    Report Status 04/16/2023 FINAL  Final  Culture, blood (Routine X 2) w Reflex to ID Panel     Status: None   Collection Time: 04/11/23 11:52 PM   Specimen: BLOOD RIGHT HAND  Result Value Ref Range Status   Specimen Description BLOOD RIGHT HAND  Final   Special Requests   Final    BOTTLES DRAWN AEROBIC ONLY Blood Culture results may not be optimal due to an inadequate volume of blood received in culture bottles   Culture   Final    NO GROWTH 5 DAYS Performed at [facility], [address], [place], [place] [address]    Report Status 04/17/2023 FINAL  Final    RADIOLOGY STUDIES/RESULTS: No results found.   LOS: 7 days   Jeoffrey Massed, MD  [facility]    To contact the attending provider between 7A-7P or the covering provider during after hours 7P-7A, please log into the web site [email or web address] and access using universal [place] password for that web site. If you do not have the password, please call the hospital operator.  04/17/2023, 12:11 PM

## 2023-04-17 NOTE — Progress Notes (Signed)
   04/17/23 1315  Vitals  Temp 98.7 F (37.1 C)  Pulse Rate 99  Resp 20  BP 109/84  SpO2 100 %  Weight 52.8 kg  Type of Weight Post-Dialysis  Oxygen Therapy  O2 Flow Rate (L/min) 2 L/min  Patient Activity (if Appropriate) In bed  Pulse Oximetry Type Continuous  Post Treatment  Dialyzer Clearance Clear  Liters Processed 84  Fluid Removed (mL) 2000 mL  Tolerated HD Treatment Yes   Received patient in bed to unit.  Alert and oriented.  Informed consent signed and in chart.   TX duration:3.5HRS  Patient tolerated well.  Transported back to the room  Alert, without acute distress.  Hand-off given to patient's nurse.   Access used: [facility] Access issues: NONE  Total UF removed: 2L Medication(s) given: NONE

## 2023-04-17 NOTE — TOC Progression Note (Signed)
 Transition of Care [facility]) - Progression Note    Patient Details  Name: SALAYA HOLTROP MRN: [id number] Date of Birth: 12-24-1935  [facility]) CM/SW Contact  Jessie Foot, RN Phone Number: 04/17/2023, 3:08 PM  Clinical Narrative:    Jamal Maes to call patient's son [name]). No answer, left message to return call.   Barriers to Discharge: Continued Medical Work up, English as a second language teacher, SNF Pending bed offer  Expected Discharge Plan and Services In-house Referral: Clinical Social Work Discharge Planning Services: CM Consult   Living arrangements for the past 2 months: Single Family Home                 DME Arranged: N/A                     Social Determinants of Health (SDOH) Interventions SDOH Screenings   Food Insecurity: No Food Insecurity (04/12/2023)  Housing: Low Risk  (04/12/2023)  Transportation Needs: No Transportation Needs (04/12/2023)  Utilities: Not At Risk (04/12/2023)  Alcohol Screen: Low Risk  (01/08/2023)  Depression (PHQ2-9): Low Risk  (01/08/2023)  Financial Resource Strain: Low Risk  (04/02/2023)   Received from [facility]  Physical Activity: Inactive (02/06/2023)  Social Connections: Socially Isolated (04/12/2023)  Stress: No Stress Concern Present (01/08/2023)  Tobacco Use: Low Risk  (04/10/2023)  Health Literacy: Adequate Health Literacy (01/08/2023)    Readmission Risk Interventions    04/16/2023    9:41 AM 01/02/2023    4:07 PM 10/30/2022    3:26 PM  Readmission Risk Prevention Plan  Transportation Screening Complete Complete Complete  Medication Review [employer]) Complete Referral to Pharmacy Complete  PCP or Specialist appointment within 3-5 days of discharge Complete    HRI or Home Care Consult Complete Complete Complete  SW Recovery Care/Counseling Consult Complete Complete Complete  Palliative Care Screening Not Applicable Not Applicable Not Applicable  Skilled Nursing Facility Not Applicable  Not Applicable Not Applicable

## 2023-04-17 NOTE — Plan of Care (Signed)
  Problem: Coping: Goal: Ability to adjust to condition or change in health will improve Outcome: Progressing   Problem: Fluid Volume: Goal: Ability to maintain a balanced intake and output will improve Outcome: Progressing   Problem: Metabolic: Goal: Ability to maintain appropriate glucose levels will improve Outcome: Progressing   Problem: Clinical Measurements: Goal: Ability to maintain clinical measurements within normal limits will improve Outcome: Progressing   Problem: Coping: Goal: Level of anxiety will decrease Outcome: Progressing   Problem: Skin Integrity: Goal: Risk for impaired skin integrity will decrease Outcome: Progressing

## 2023-04-17 NOTE — Progress Notes (Addendum)
 [facility] Progress Note   Subjective: Seen on HD. Says she is going home but no order. BP on lower side today. May use extra midodrine if needed.     Objective Vitals:   04/17/23 0000 04/17/23 0400 04/17/23 0500 04/17/23 0820  BP: (!) 137/106 (!) 128/95  (!) 122/91  Pulse: 95 (!) 103  (!) 105  Resp: 17 15  13   Temp: 98.5 F (36.9 C) 97.6 F (36.4 C)  97.9 F (36.6 C)  TempSrc: Oral Oral  Oral  SpO2: 100% 98%  97%  Weight:   52.9 kg    Physical Exam General: Chronically ill appearing elderly female in NAD Heart: irreg irreg No M/R/G Lungs: Bilateral breath sounds decreased in bases otherwise CTAB.  Abdomen:NABS, NT Extremities: No LE edema Dialysis Access: RIJ TDC Drsg intact. L AVF +T/B   Additional Objective Labs: Basic Metabolic Panel: Recent Labs  Lab 04/13/23 0555 04/14/23 0514 04/16/23 0847  NA 135 131* 130*  K 4.5 5.2* 4.9  CL 92* 93* 90*  CO2 23 20* 19*  GLUCOSE 115* 172* 116*  BUN 34* 46* 34*  CREATININE 3.91* 4.78* 3.77*  CALCIUM 9.9 9.1 9.4  PHOS 7.7* 7.4* 6.4*   Liver Function Tests: Recent Labs  Lab 04/10/23 1826 04/11/23 0515 04/13/23 0555 04/14/23 0514 04/16/23 0847  AST 36 24  --   --   --   ALT 31 29  --   --   --   ALKPHOS 65 61  --   --   --   BILITOT 1.3* 1.2  --   --   --   PROT 5.9* 5.4*  --   --   --   ALBUMIN 2.5* 2.3* 3.1* 2.7* 2.7*   No results for input(s): "LIPASE", "AMYLASE" in the last 168 hours. CBC: Recent Labs  Lab 04/10/23 1826 04/10/23 2035 04/11/23 0515 04/11/23 0607 04/11/23 2050 04/13/23 0555 04/14/23 0514 04/16/23 1049  WBC 13.3*  --  14.6*  --  16.4* 12.8* 13.0* 12.7*  NEUTROABS 11.8*  --   --   --   --   --  12.2*  --   HGB 11.0*   < > 10.8*   < > 11.1* 10.7* 10.7* 12.6  HCT 37.6   < > 38.2   < > 37.2 36.6 35.3* 41.2  MCV 97.2  --  100.5*  --  94.7 95.8 93.6 91.4  PLT 198  --  200  --  235 192 199 193   < > = values in this interval not displayed.   Blood Culture    Component  Value Date/Time   SDES BLOOD RIGHT HAND 04/11/2023 2352   SPECREQUEST  04/11/2023 2352    BOTTLES DRAWN AEROBIC ONLY Blood Culture results may not be optimal due to an inadequate volume of blood received in culture bottles   CULT  04/11/2023 2352    NO GROWTH 5 DAYS Performed at [facility], [address], [place], [place] [address]    REPTSTATUS 04/17/2023 FINAL 04/11/2023 2352    Cardiac Enzymes: No results for input(s): "CKTOTAL", "CKMB", "CKMBINDEX", "TROPONINI" in the last 168 hours. CBG: Recent Labs  Lab 04/16/23 0909 04/16/23 1152 04/16/23 1530 04/16/23 2141 04/17/23 0846  GLUCAP 159* 155* 146* 107* 110*   Iron Studies: No results for input(s): "IRON", "TIBC", "TRANSFERRIN", "FERRITIN" in the last 72 hours. @lablastinr3 @ Studies/Results: No results found. Medications:  albumin human 25 g (04/11/23 1627)   cefTRIAXone (ROCEPHIN)  IV  2 g (04/16/23 2122)   doxycycline (VIBRAMYCIN) IV 100 mg (04/16/23 2314)    amiodarone  400 mg Oral BID   Chlorhexidine Gluconate Cloth  6 each Topical Q0600   doxercalciferol  3 mcg Intravenous Q M,W,F-HD   heparin injection (subcutaneous)  5,000 Units Subcutaneous Q8H   lipase/protease/amylase  36,000 Units Oral TID WC   metoprolol tartrate  25 mg Oral BID   midodrine  5 mg Oral TID WC   multivitamin  1 tablet Oral QHS   sevelamer carbonate  800 mg Oral TID WC     Dialysis Orders: [facility] MWF 3.5h   B350   50.5kg   2K bath   TDC+  AVF (using TDC) Heparin none - last OP HD 2/19, post wt 49.6kg  - getting to dry wt, sometimes under, very low IDWG's - hectorol 3 mcg - venofer 50 weekly - mircera 225 mcg q2, last 2/17, due 3/03   Assessment/Plan: Sepsis - probable LLE cellulitis, started on IV abx w/ cefepime and vanc, now rocephin and doxy ordered.   Acute hypoxic / hypercarbic resp failure - recent flu A, LLL opacity on CXR. Seen by pulm for consult. Sending RVP/ covid / flu, blood cx's. Prn bipap. IV abx for poss pna. She  is usually NOT on O2 at home. O2 requirement slowly coming down. Off Bipap and now on HFNC 4L. Left foot ulcer - Seen by Dr. Lajoyce Corners, per his note, watch for now. Not a surgical candidate. Plan to f/u in outpatient. Persistent AF-cardiology following Not on AC.  ESRD - MWF but off schedule D/T scheduling. Will continue T,Th,S schedule this week and resume MWF next week. Next HD 04/19/2023 then change to MWF 04/21/2023. BP - on midodrine 10 tid at home however BP now higher. Decrease midodrine to 5 mg PO TID  Volume - no vol overload, continue to optimize volume status with HD. Net UF 1.5 liters with HD 04/15/2023. Currently at OP EDW.  Anemia of esrd - Hb 10- 12 here, no esa needs.  Secondary hyperparathyroidism - CCa in range, phos a bit high. Cont binder w/ meals and IV vdra.  RCC - s/p ablation c/b urine leak s/p neph tube Nutrition: Low albumin, very frail. Add protein supplements.      Medication Issues; Preferred narcotic agents for pain control are hydromorphone, fentanyl, and methadone. Morphine should not be used.  Baclofen should be avoided Avoid oral sodium phosphate and magnesium citrate based laxatives / bowel preps    Rita H. Brown NP-C 04/17/2023, 9:37 AM  [employer] [phone]   Seen and examined independently.  Agree with note and exam as documented above by physician extender and as noted here.  Seen and examined on dialysis.  Blood pressure 109/81 and HR 68.  RIJ tunn catheter in use.  Left AVF with bruit and thrill Tolerating goal.  On 1 liter oxygen.  Unlabored.     Estanislado Emms, MD 04/17/2023  11:17 AM

## 2023-04-17 NOTE — Progress Notes (Signed)
 Progress Note  Patient Name: Kim Burns Date of Encounter: 04/17/2023  Primary Cardiologist:   Peter Swaziland, MD   Subjective   Weak and confused.   Inpatient Medications    Scheduled Meds:  amiodarone  400 mg Oral BID   Chlorhexidine Gluconate Cloth  6 each Topical Q0600   doxercalciferol  3 mcg Intravenous Q M,W,F-HD   heparin injection (subcutaneous)  5,000 Units Subcutaneous Q8H   lipase/protease/amylase  36,000 Units Oral TID WC   metoprolol tartrate  25 mg Oral BID   midodrine  5 mg Oral TID WC   multivitamin  1 tablet Oral QHS   sevelamer carbonate  800 mg Oral TID WC   Continuous Infusions:  albumin human 25 g (04/11/23 1627)   cefTRIAXone (ROCEPHIN)  IV 2 g (04/16/23 2122)   doxycycline (VIBRAMYCIN) IV 100 mg (04/16/23 2314)   PRN Meds: acetaminophen **OR** acetaminophen, albumin human, diltiazem, lipase/protease/amylase, melatonin, metoprolol tartrate, ondansetron (ZOFRAN) IV, sevelamer carbonate   Vital Signs    Vitals:   04/17/23 0000 04/17/23 0400 04/17/23 0500 04/17/23 0820  BP: (!) 137/106 (!) 128/95  (!) 122/91  Pulse: 95 (!) 103  (!) 105  Resp: 17 15  13   Temp: 98.5 F (36.9 C) 97.6 F (36.4 C)  97.9 F (36.6 C)  TempSrc: Oral Oral  Oral  SpO2: 100% 98%  97%  Weight:   52.9 kg     Intake/Output Summary (Last 24 hours) at 04/17/2023 0934 Last data filed at 04/17/2023 0647 Gross per 24 hour  Intake 2550 ml  Output 20 ml  Net 2530 ml   Filed Weights   04/15/23 0834 04/15/23 1210 04/17/23 0500  Weight: 61.4 kg 50.4 kg 52.9 kg    Telemetry    NA - Personally Reviewed  ECG    NA - Personally Reviewed  Physical Exam   GEN: No acute distress.  Chronically ill appearing Neck: No  JVD Cardiac: RRR, no murmurs, rubs, or gallops.  Respiratory: Clear  to auscultation bilaterally. GI: Soft, nontender, non-distended  MS: No  edema; No deformity.  Labs    Chemistry Recent Labs  Lab 04/10/23 1826 04/10/23 2035 04/11/23 0515  04/11/23 0607 04/13/23 0555 04/14/23 0514 04/16/23 0847  NA 139   < > 139   < > 135 131* 130*  K 3.7   < > 4.1   < > 4.5 5.2* 4.9  CL 98  --  99  --  92* 93* 90*  CO2 21*  --  22  --  23 20* 19*  GLUCOSE 208*  --  107*  --  115* 172* 116*  BUN 34*  --  35*  --  34* 46* 34*  CREATININE 3.83*  --  3.88*  --  3.91* 4.78* 3.77*  CALCIUM 8.1*  --  8.1*  --  9.9 9.1 9.4  PROT 5.9*  --  5.4*  --   --   --   --   ALBUMIN 2.5*  --  2.3*  --  3.1* 2.7* 2.7*  AST 36  --  24  --   --   --   --   ALT 31  --  29  --   --   --   --   ALKPHOS 65  --  61  --   --   --   --   BILITOT 1.3*  --  1.2  --   --   --   --  GFRNONAA 11*  --  11*  --  11* 8* 11*  ANIONGAP 20*  --  18*  --  20* 18* 21*   < > = values in this interval not displayed.     Hematology Recent Labs  Lab 04/13/23 0555 04/14/23 0514 04/16/23 1049  WBC 12.8* 13.0* 12.7*  RBC 3.82* 3.77* 4.51  HGB 10.7* 10.7* 12.6  HCT 36.6 35.3* 41.2  MCV 95.8 93.6 91.4  MCH 28.0 28.4 27.9  MCHC 29.2* 30.3 30.6  RDW 21.6* 21.6* 21.5*  PLT 192 199 193    Cardiac EnzymesNo results for input(s): "TROPONINI" in the last 168 hours. No results for input(s): "TROPIPOC" in the last 168 hours.   BNP Recent Labs  Lab 04/10/23 2017 04/11/23 0902  BNP 2,195.1* 2,179.6*     DDimer No results for input(s): "DDIMER" in the last 168 hours.   Radiology    No results found.  Cardiac Studies   Echocardiogram 04/11/2023: Impressions: 1. Left ventricular ejection fraction, by estimation, is 55 to 60%. The  left ventricle has normal function. The left ventricle has no regional  wall motion abnormalities. Left ventricular diastolic parameters are  indeterminate. There is the  interventricular septum is flattened in systole and diastole, consistent  with right ventricular pressure and volume overload.   2. Right ventricular systolic function is moderately reduced. The right  ventricular size is mildly enlarged. There is severely elevated  pulmonary  artery systolic pressure.   3. Left atrial size was moderately dilated.   4. Right atrial size was mildly dilated.   5. The mitral valve is normal in structure. Mild mitral valve  regurgitation. No evidence of mitral stenosis. Severe mitral annular  calcification.   6. Tricuspid valve regurgitation is moderate.   7. The aortic valve is tricuspid. Aortic valve regurgitation is not  visualized. Aortic valve sclerosis is present, with no evidence of aortic  valve stenosis.   8. The inferior vena cava is dilated in size with <50% respiratory  variability, suggesting right atrial pressure of 15 mmHg.   Patient Profile     88 y.o. female  with a history of non-obstructive CAD on cardiac catheterization in 08/2021, chronic HFpEF,  persistent atrial fibrillation not on anticoagulation, hypertension complicated by orthostatic hypotension, type 2 diabetes mellitus, CVA in 10/2022, ESRD on hemodialysis M/W/F, chronic anemia, osteomyelitis of left second toe in 03/2023 (treated with antibiotics), Parkinson's disease, pancreatic cancer s/p Whipple procedure in 2018, renal cell carcinoma s/p left renal embolization and ablation with nephrostomy tube placement. Patient was recently admitted earlier this month from 03/26/2023 to 03/29/2023 at [facility] for cellulitis and acute osteomyelitis of left second toe which was treated with antibiotics. She was then admitted from 04/02/2023 to 04/05/2023 at [facility] for influenza A. She was treated with Tamiflu. She then presented back to the [facility] ED on 04/10/2023 for continued cough, fatigue, and decreased PO intake and was ultimately admitted for sepsis felt to be secondary to left lower foot cellulitis/ osteomyelitis as well as recent influenza infection with pneumonia. Cardiology was consulted for further evaluation of atrial fibrillation and elevated troponin.    Assessment & Plan    Atrial fib:  Not a bleeding candidate as previously noted.    Rates have been controlled.  She is getting to PO beta blocker with no doses held.  Amio is on board for rate control.  We will likely send hom on reduced dose but keep at loading dose for now.  Elevated trop:  No evidence of acute coronary syndrome.   No ischemia work up this admission.   Acute on chronic HFpEF:  Volume per dialysis.    Orthostatic hypotension:  Tolerating low dose beta blocker but needs midodrine as well.    We will follow as needed during this hospitalization.    For questions or updates, please contact [facility] Please consult [email or web address] for contact info under Cardiology/STEMI.   Signed, Rollene Rotunda, MD  04/17/2023, 9:34 AM

## 2023-04-17 NOTE — Progress Notes (Signed)
 VAST consult received to obtain IV access earlier today, but patient in HD. Nurse placed consult once patient back in room. Upon arrival at bedside pt's IV medications had been dc'd. Pt with VSS at this time and no IV access is needed for infusions/meds. Attempted to fix issue with IV in right [facility] but was unsuccessful. Pt's left arm is restricted d/t AVF. Advised nurse that best practice is to leave out IV access if it is not needed at present time. Also advised if circumstances change and IV access is needed, an IV consult should be placed at that time. Ranjita, RN verbalized understanding.

## 2023-04-17 NOTE — Plan of Care (Signed)
  Problem: Education: Goal: Ability to describe self-care measures that may prevent or decrease complications (Diabetes Survival Skills Education) will improve Outcome: Progressing   Problem: Coping: Goal: Ability to adjust to condition or change in health will improve Outcome: Progressing   Problem: Fluid Volume: Goal: Ability to maintain a balanced intake and output will improve Outcome: Progressing   Problem: Health Behavior/Discharge Planning: Goal: Ability to identify and utilize available resources and services will improve Outcome: Progressing   Problem: Health Behavior/Discharge Planning: Goal: Ability to manage health-related needs will improve Outcome: Progressing   Problem: Clinical Measurements: Goal: Ability to maintain clinical measurements within normal limits will improve Outcome: Progressing Goal: Will remain free from infection Outcome: Progressing   Problem: Coping: Goal: Level of anxiety will decrease Outcome: Progressing   Problem: Elimination: Goal: Will not experience complications related to urinary retention Outcome: Progressing

## 2023-04-18 DIAGNOSIS — R531 Weakness: Secondary | ICD-10-CM | POA: Diagnosis not present

## 2023-04-18 DIAGNOSIS — I480 Paroxysmal atrial fibrillation: Secondary | ICD-10-CM | POA: Diagnosis not present

## 2023-04-18 DIAGNOSIS — E1122 Type 2 diabetes mellitus with diabetic chronic kidney disease: Secondary | ICD-10-CM | POA: Diagnosis not present

## 2023-04-18 DIAGNOSIS — N186 End stage renal disease: Secondary | ICD-10-CM | POA: Diagnosis not present

## 2023-04-18 DIAGNOSIS — I158 Other secondary hypertension: Secondary | ICD-10-CM | POA: Diagnosis not present

## 2023-04-18 DIAGNOSIS — R7989 Other specified abnormal findings of blood chemistry: Secondary | ICD-10-CM | POA: Diagnosis not present

## 2023-04-18 DIAGNOSIS — Z992 Dependence on renal dialysis: Secondary | ICD-10-CM | POA: Diagnosis not present

## 2023-04-18 LAB — GLUCOSE, CAPILLARY: Glucose-Capillary: 40 mg/dL — CL (ref 70–99)

## 2023-04-18 MED ORDER — HEPARIN SODIUM (PORCINE) 1000 UNIT/ML DIALYSIS: 1000.0000 [IU] | INTRAMUSCULAR | Status: DC | PRN

## 2023-04-18 MED ORDER — SEVELAMER CARBONATE 800 MG PO TABS: 800.0000 mg | ORAL_TABLET | ORAL | Status: DC

## 2023-04-18 MED ORDER — METOPROLOL TARTRATE 25 MG PO TABS: 25.0000 mg | ORAL_TABLET | Freq: Two times a day (BID) | ORAL | Status: DC

## 2023-04-18 MED ORDER — HEPARIN SODIUM (PORCINE) 1000 UNIT/ML IJ SOLN
INTRAMUSCULAR | Status: AC
  Filled 2023-04-18: qty 1

## 2023-04-18 MED ORDER — HEPARIN SODIUM (PORCINE) 1000 UNIT/ML IJ SOLN
INTRAMUSCULAR | Status: AC
Start: 2023-04-18 — End: 2023-04-18
  Administered 2023-04-18: 1000 [IU]
  Filled 2023-04-18: qty 2

## 2023-04-18 MED ORDER — MIDODRINE HCL 10 MG PO TABS: 5.0000 mg | ORAL_TABLET | Freq: Three times a day (TID) | ORAL | Status: DC

## 2023-04-18 MED ORDER — ROPINIROLE HCL 0.25 MG PO TABS: 0.7500 mg | ORAL_TABLET | Freq: Every day | ORAL | Status: DC

## 2023-04-18 MED ORDER — AMIODARONE HCL 200 MG PO TABS
200.0000 mg | ORAL_TABLET | Freq: Every day | ORAL | Status: DC
  Administered 2023-04-18 – 2023-04-19 (×2): 200 mg via ORAL
  Filled 2023-04-18 (×2): qty 1

## 2023-04-18 NOTE — Discharge Planning (Addendum)
 [facility] Patient Discharge Orders- [facility] CLINIC: [facility]  **REVISED**  Patient's name: MAKAILEE NUDELMAN Admit/DC Dates: 04/10/2023 -04/19/2023   Discharge Diagnoses: Acute hypoxic/hypercarbic respiratory failure due to influenza A pneumonia and HFpEF exacerbation  Severe sepsis secondary to left foot cellulitis/acute second toe osteomyelitis/influenza infection with PNA s/p ABX  Aranesp: Given: No   Last Hgb: 12.5 PRBC's Given: No  ESA dose for discharge: None. mircera per anemia protocol.  IV Iron dose at discharge: Per anemia protocol  Heparin change: No  EDW Change: No   Bath Change: NA  Access intervention/Change: NA Details:  Hectorol/Calcitriol change: Yes corrected calcium 11 - Hold hectorol. Repeat Ca and albumin in 1 week.   Discharge Labs: Calcium 9.9 Phosphorus 8.1  Albumin 2.6  K+ 4.9  IV Antibiotics: No Details:  On Coumadin?: No   OTHER/APPTS/LAB ORDERS: -Patient reports having trouble swallowing Renvela.   -Per patient, tried Renvela powder in the past and did not like it.  Consider changing to Velphoro or fosrenol - she does not think she will have a problem chewing it.     D/C Meds to be reconciled by nurse after every discharge.  Completed By: Alonna Buckler [facility] [phone]   Revised by: Virgina Norfolk, PA-C   Reviewed by: MD:______ RN_______

## 2023-04-18 NOTE — Progress Notes (Signed)
 PT Cancellation Note  Patient Details Name: Kim Burns MRN: [id number] DOB: 1935/09/22   Cancelled Treatment:    Reason Eval/Treat Not Completed: Other (comment) Pt received asleep in bed, sleeping soundly. Eventually able to partially arouse pt with pt declining OOB mobility, requesting to continue resting. Pt reports pain when PT touched site of L nephrostomy tube insertion - site looked fine to PT but notified nurse.  Aleda Grana, PT, DPT 04/18/23, 3:49 PM   Sandi Mariscal 04/18/2023, 3:48 PM

## 2023-04-18 NOTE — Progress Notes (Signed)
 PT Cancellation Note  Patient Details Name: NORAA PICKERAL MRN: [id number] DOB: 03-03-1935   Cancelled Treatment:    Reason Eval/Treat Not Completed: Patient at procedure or test/unavailable Pt noted to be off the floor at this time at dialysis. Will f/u as able.  Aleda Grana, PT, DPT 04/18/23, 8:53 AM   Sandi Mariscal 04/18/2023, 8:53 AM

## 2023-04-18 NOTE — Progress Notes (Addendum)
 [facility] Progress Note   Subjective: On HD, no C/Os. DC today to SNF.    Objective Vitals:   04/18/23 0500 04/18/23 0800 04/18/23 0812 04/18/23 0830  BP:  (!) 122/91 127/76 113/72  Pulse:  92 96 79  Resp:  18 12 17   Temp:  97.8 F (36.6 C)    TempSrc:      SpO2:  (!) 84% 100% (!) 86%  Weight: 53.6 kg 52 kg     Physical Exam General: Chronically ill appearing elderly female in NAD Heart: irreg irreg No M/R/G Lungs: Bilateral breath sounds decreased in bases otherwise CTAB.  Abdomen:NABS, NT Extremities: No LE edema Dialysis Access: RIJ TDC Drsg intact. L AVF +T/B    Additional Objective Labs: Basic Metabolic Panel: Recent Labs  Lab 04/14/23 0514 04/16/23 0847 04/17/23 0938  NA 131* 130* 130*  K 5.2* 4.9 4.9  CL 93* 90* 90*  CO2 20* 19* 19*  GLUCOSE 172* 116* 114*  BUN 46* 34* 40*  CREATININE 4.78* 3.77* 4.55*  CALCIUM 9.1 9.4 9.9  PHOS 7.4* 6.4* 8.1*   Liver Function Tests: Recent Labs  Lab 04/14/23 0514 04/16/23 0847 04/17/23 0938  ALBUMIN 2.7* 2.7* 2.6*   No results for input(s): "LIPASE", "AMYLASE" in the last 168 hours. CBC: Recent Labs  Lab 04/11/23 2050 04/13/23 0555 04/14/23 0514 04/16/23 1049 04/17/23 0938  WBC 16.4* 12.8* 13.0* 12.7* 13.4*  NEUTROABS  --   --  12.2*  --   --   HGB 11.1* 10.7* 10.7* 12.6 12.6  HCT 37.2 36.6 35.3* 41.2 41.2  MCV 94.7 95.8 93.6 91.4 90.9  PLT 235 192 199 193 201   Blood Culture    Component Value Date/Time   SDES BLOOD RIGHT HAND 04/11/2023 2352   SPECREQUEST  04/11/2023 2352    BOTTLES DRAWN AEROBIC ONLY Blood Culture results may not be optimal due to an inadequate volume of blood received in culture bottles   CULT  04/11/2023 2352    NO GROWTH 5 DAYS Performed at [facility], [address] [place], [place] [address]    REPTSTATUS 04/17/2023 FINAL 04/11/2023 2352    Cardiac Enzymes: No results for input(s): "CKTOTAL", "CKMB", "CKMBINDEX", "TROPONINI" in the last 168  hours. CBG: Recent Labs  Lab 04/17/23 0846 04/17/23 1441 04/17/23 1501 04/17/23 1657 04/17/23 2128  GLUCAP 110* 40* 140* 128* 92   Iron Studies: No results for input(s): "IRON", "TIBC", "TRANSFERRIN", "FERRITIN" in the last 72 hours. @lablastinr3 @ Studies/Results: No results found. Medications:  albumin human 25 g (04/11/23 1627)    amiodarone  400 mg Oral BID   Chlorhexidine Gluconate Cloth  6 each Topical Q0600   doxercalciferol  3 mcg Intravenous Q M,W,F-HD   heparin injection (subcutaneous)  5,000 Units Subcutaneous Q8H   lipase/protease/amylase  36,000 Units Oral TID WC   metoprolol tartrate  25 mg Oral BID   midodrine  5 mg Oral TID WC   multivitamin  1 tablet Oral QHS   sevelamer carbonate  800 mg Oral TID WC     Dialysis Orders: [facility] MWF 3.5h   B350   50.5kg   2K bath   TDC+  AVF (using TDC) Heparin none - last OP HD 2/19, post wt 49.6kg  - getting to dry wt, sometimes under, very low IDWG's - hectorol 3 mcg - venofer 50 weekly - mircera 225 mcg q2, last 2/17, due 3/03   Assessment/Plan: Sepsis - probable LLE cellulitis, started on IV abx w/ cefepime  and vanc, now rocephin and doxy ordered.   Acute hypoxic / hypercarbic resp failure - recent flu A, LLL opacity on CXR. Seen by pulm for consult. Sending RVP/ covid / flu, blood cx's. Prn bipap. IV abx for poss pna. She is usually NOT on O2 at home. O2 requirement slowly coming down Left foot ulcer - Seen by Dr. Lajoyce Corners, per his note, watch for now. Not a surgical candidate. Plan to f/u in outpatient. Persistent AF-cardiology following Not on AC.  ESRD - MWF but off schedule D/T scheduling. Will continue T,Th,S schedule this week and resume MWF next week.  BP - on midodrine 10 tid at home however BP now higher. Decrease midodrine to 5 mg PO TID  Volume - no vol overload, continue to optimize volume status with HD. Net UF 1.5 liters with HD 04/15/2023. Currently at OP EDW.  Anemia of esrd - Hb 10- 12 here, no esa needs.   Secondary hyperparathyroidism - CCa in range, phos a bit high. Cont binder w/ meals and IV vdra.  RCC - s/p ablation c/b urine leak s/p neph tube Nutrition: Low albumin, very frail. Add protein supplements.      Medication Issues; Preferred narcotic agents for pain control are hydromorphone, fentanyl, and methadone. Morphine should not be used.  Baclofen should be avoided Avoid oral sodium phosphate and magnesium citrate based laxatives / bowel preps      Rita H. Brown NP-C 04/18/2023, 8:54 AM  [employer] [phone]     Seen and examined independently.  Agree with note and exam as documented above by physician extender and as noted here.  Seen and examined on dialysis.  Procedure supervised.  RIJ tunn cathetr in use. BP 116/74 and HR 95.  Tolerating goal.    She is on room air and breathing comfortably.  Left AVF with bruit and thrill as well.  States she feels tired but better  Estanislado Emms, MD 04/18/2023  10:36 AM

## 2023-04-18 NOTE — Progress Notes (Signed)
 Received patient in bed to unit.  Alert and oriented.  Informed consent signed and in chart.   TX duration:3  Patient tolerated well.  Transported back to the room  Alert, without acute distress.  Hand-off given to patient's nurse. Nyoka Lint RN  Access used: Right [facility] Access issues: None  Total UF removed: 1.5 L Medication(s) given: See [facility]   04/18/23 1130  Vitals  Temp (!) 97.4 F (36.3 C)  Pulse Rate 86  Resp 17  BP 103/74  SpO2 100 %  O2 Device Room Air  Weight 50.2 kg  Type of Weight Post-Dialysis  Oxygen Therapy  Patient Activity (if Appropriate) In bed  Pulse Oximetry Type Continuous  Oximetry Probe Site Changed No  Post Treatment  Dialyzer Clearance Clear  Fluid Removed (mL) 1500 mL  Tolerated HD Treatment Yes

## 2023-04-18 NOTE — Plan of Care (Signed)

## 2023-04-18 NOTE — Progress Notes (Signed)
 Progress Note  Patient Name: Kim Burns Date of Encounter: 04/18/2023  Primary Cardiologist:   Peter Swaziland, MD   Subjective   Somnolent and confused  Inpatient Medications    Scheduled Meds:  amiodarone  400 mg Oral BID   Chlorhexidine Gluconate Cloth  6 each Topical Q0600   doxercalciferol  3 mcg Intravenous Q M,W,F-HD   heparin injection (subcutaneous)  5,000 Units Subcutaneous Q8H   heparin sodium (porcine)       lipase/protease/amylase  36,000 Units Oral TID WC   metoprolol tartrate  25 mg Oral BID   midodrine  5 mg Oral TID WC   multivitamin  1 tablet Oral QHS   sevelamer carbonate  800 mg Oral TID WC   Continuous Infusions:  albumin human 25 g (04/11/23 1627)   PRN Meds: acetaminophen **OR** acetaminophen, albumin human, benzonatate, diltiazem, heparin, heparin sodium (porcine), lipase/protease/amylase, melatonin, metoprolol tartrate, ondansetron (ZOFRAN) IV, sevelamer carbonate   Vital Signs    Vitals:   04/18/23 1100 04/18/23 1118 04/18/23 1122 04/18/23 1130  BP: 106/67 90/64 103/74 103/74  Pulse: 91 85 86 86  Resp: 15 11 17 17   Temp:  (!) 97.4 F (36.3 C)  (!) 97.4 F (36.3 C)  TempSrc:      SpO2: 96% 100% 97% 100%  Weight:   50.2 kg 50.2 kg    Intake/Output Summary (Last 24 hours) at 04/18/2023 1138 Last data filed at 04/18/2023 1130 Gross per 24 hour  Intake --  Output 3506.5 ml  Net -3506.5 ml   Filed Weights   04/18/23 0800 04/18/23 1122 04/18/23 1130  Weight: 52 kg 50.2 kg 50.2 kg    Telemetry    Atrial fib with controlled ventricular rate - Personally Reviewed  ECG    NA - Personally Reviewed  Physical Exam   GEN:    Chronically ill appearing and very frail Neck: No  JVD Cardiac: Irregular RR, no murmurs, rubs, or gallops.  Respiratory: Clear   to auscultation bilaterally. GI: Soft, nontender, non-distended, normal bowel sounds  MS:  No edema; No deformity. Neuro:   Nonfocal  Psych: Confused   Labs     Chemistry Recent Labs  Lab 04/14/23 0514 04/16/23 0847 04/17/23 0938  NA 131* 130* 130*  K 5.2* 4.9 4.9  CL 93* 90* 90*  CO2 20* 19* 19*  GLUCOSE 172* 116* 114*  BUN 46* 34* 40*  CREATININE 4.78* 3.77* 4.55*  CALCIUM 9.1 9.4 9.9  ALBUMIN 2.7* 2.7* 2.6*  GFRNONAA 8* 11* 9*  ANIONGAP 18* 21* 21*     Hematology Recent Labs  Lab 04/14/23 0514 04/16/23 1049 04/17/23 0938  WBC 13.0* 12.7* 13.4*  RBC 3.77* 4.51 4.53  HGB 10.7* 12.6 12.6  HCT 35.3* 41.2 41.2  MCV 93.6 91.4 90.9  MCH 28.4 27.9 27.8  MCHC 30.3 30.6 30.6  RDW 21.6* 21.5* 21.4*  PLT 199 193 201    Cardiac EnzymesNo results for input(s): "TROPONINI" in the last 168 hours. No results for input(s): "TROPIPOC" in the last 168 hours.   BNP No results for input(s): "BNP", "PROBNP" in the last 168 hours.    DDimer No results for input(s): "DDIMER" in the last 168 hours.   Radiology    No results found.  Cardiac Studies   Echocardiogram 04/11/2023: Impressions: 1. Left ventricular ejection fraction, by estimation, is 55 to 60%. The  left ventricle has normal function. The left ventricle has no regional  wall motion abnormalities. Left ventricular diastolic parameters  are  indeterminate. There is the  interventricular septum is flattened in systole and diastole, consistent  with right ventricular pressure and volume overload.   2. Right ventricular systolic function is moderately reduced. The right  ventricular size is mildly enlarged. There is severely elevated pulmonary  artery systolic pressure.   3. Left atrial size was moderately dilated.   4. Right atrial size was mildly dilated.   5. The mitral valve is normal in structure. Mild mitral valve  regurgitation. No evidence of mitral stenosis. Severe mitral annular  calcification.   6. Tricuspid valve regurgitation is moderate.   7. The aortic valve is tricuspid. Aortic valve regurgitation is not  visualized. Aortic valve sclerosis is present, with  no evidence of aortic  valve stenosis.   8. The inferior vena cava is dilated in size with <50% respiratory  variability, suggesting right atrial pressure of 15 mmHg.   Patient Profile     88 y.o. female  with a history of non-obstructive CAD on cardiac catheterization in 08/2021, chronic HFpEF,  persistent atrial fibrillation not on anticoagulation, hypertension complicated by orthostatic hypotension, type 2 diabetes mellitus, CVA in 10/2022, ESRD on hemodialysis M/W/F, chronic anemia, osteomyelitis of left second toe in 03/2023 (treated with antibiotics), Parkinson's disease, pancreatic cancer s/p Whipple procedure in 2018, renal cell carcinoma s/p left renal embolization and ablation with nephrostomy tube placement. Patient was recently admitted earlier this month from 03/26/2023 to 03/29/2023 at [facility] for cellulitis and acute osteomyelitis of left second toe which was treated with antibiotics. She was then admitted from 04/02/2023 to 04/05/2023 at [facility] for influenza A. She was treated with Tamiflu. She then presented back to the [facility] ED on 04/10/2023 for continued cough, fatigue, and decreased PO intake and was ultimately admitted for sepsis felt to be secondary to left lower foot cellulitis/ osteomyelitis as well as recent influenza infection with pneumonia. Cardiology was consulted for further evaluation of atrial fibrillation and elevated troponin.    Assessment & Plan    Atrial fib:  Not an anticoagulation candidate as previously noted.   Rates have been controlled.  She is getting to PO beta blocker with no doses held.  I will change to PO amiodarone today and will suggest 200 mg po daily.    Elevated trop:  No evidence of acute coronary syndrome.   No ischemia work up.    Acute on chronic HFpEF:  Volume per dialysis  Orthostatic hypotension:   Continue midodrine  Follow up is scheduled for 3/14  For questions or updates, please contact [facility] Please consult  [email or web address] for contact info under Cardiology/STEMI.   Signed, Rollene Rotunda, MD  04/18/2023, 11:38 AM

## 2023-04-18 NOTE — TOC Transition Note (Signed)
 Transition of Care [facility]) - Discharge Note   Patient Details  Name: Kim Burns MRN: [id number] Date of Birth: 1935/04/28  Transition of [facility]) CM/SW Contact:  Jessie Foot, RN Phone Number: 04/18/2023, 11:43 AM   Clinical Narrative:     Patient will DC to: [facility] Anticipated DC date: 04/18/2023 Family notified: [name] (son) Transport [id number]   Per MD patient ready for DC to [facility] . RN to call report prior to discharge ( [phone]. [phone]). RN, patient, patient's family, and facility notified of DC. Discharge Summary and FL2 sent to facility. DC packet on chart. Ambulance transport requested for patient.   Case Manager will sign off, no additional needs. Please consult [place] again if new needs arise.    Final next level of care: Skilled Nursing Facility Barriers to Discharge: Barriers Resolved   Patient Goals and CMS Choice Patient states their goals for this hospitalization and ongoing recovery are:: Return to home          Discharge Placement   Existing PASRR number confirmed : 04/18/23          Patient chooses bed at: [facility] Patient to be transferred to facility by: [facility] Name of family member notified: [name] (son) Patient and family notified of of transfer: 04/17/23  Discharge Plan and Services Additional resources added to the After Visit Summary for   In-house Referral: Clinical Social Work Discharge Planning Services: CM Consult            DME Arranged: N/A                    Social Drivers of Health (SDOH) Interventions SDOH Screenings   Food Insecurity: No Food Insecurity (04/12/2023)  Housing: Low Risk  (04/12/2023)  Transportation Needs: No Transportation Needs (04/12/2023)  Utilities: Not At Risk (04/12/2023)  Alcohol Screen: Low Risk  (01/08/2023)  Depression (PHQ2-9): Low Risk  (01/08/2023)  Financial Resource Strain: Low Risk  (04/02/2023)   Received from [facility]   Physical Activity: Inactive (02/06/2023)  Social Connections: Socially Isolated (04/12/2023)  Stress: No Stress Concern Present (01/08/2023)  Tobacco Use: Low Risk  (04/10/2023)  Health Literacy: Adequate Health Literacy (01/08/2023)     Readmission Risk Interventions    04/18/2023    9:34 AM 04/16/2023    9:41 AM 01/02/2023    4:07 PM  Readmission Risk Prevention Plan  Transportation Screening  Complete Complete  Medication Review (RN Care Manager)  Complete Referral to Pharmacy  PCP or Specialist appointment within 3-5 days of discharge  Complete   HRI or Home Care Consult  Complete Complete  SW Recovery Care/Counseling Consult  Complete Complete  Palliative Care Screening  Not Applicable Not Applicable  Skilled Nursing Facility Complete Not Applicable Not Applicable

## 2023-04-18 NOTE — Progress Notes (Signed)
 Advised by CSW that pt will d/c to snf today. Contacted [facility] to be advised that pt will d/c today to snf and should resume care on Monday. Clinic provided snf name. Will assist as needed.   Olivia Canter Renal Navigator [phone]

## 2023-04-18 NOTE — Discharge Summary (Addendum)
 PATIENT DETAILS Name: Kim Burns Age: 88 y.o. Sex: female Date of Birth: 07-23-1935 MRN: [id number]. Admitting Physician: Angie Fava, DO NWG:NFAOZ, Christell Constant, MD  Admit Date: 04/10/2023 Discharge date: 04/19/2023  Recommendations for Outpatient Follow-up:  Follow up with PCP in 1-2 weeks Please obtain CMP/CBC in one week Ensure follow-up with dialysis clinic Ensure follow-up with cardiology and Dr. Lajoyce Corners Continue follow-up with IR for nephrostomy tube exchange.  Admitted From:  Home  Disposition: Skilled nursing facility   Discharge Condition: good  CODE STATUS:   Code Status: Full Code   Diet recommendation:  Diet Order             Diet - low sodium heart healthy           Diet renal with fluid restriction Fluid restriction: 1800 mL Fluid; Room service appropriate? Yes; Fluid consistency: Thin  Diet effective now                    Brief Summary: Patient is a 88 y.o.  female with history of ESRD on HD MWF, PAF not on anticoagulation, HTN, DM-2, chronic HFpEF-who presented with shortness of breath-found to have influenza A infection with acute hypoxic/hypercarbic respiratory failure-briefly required BiPAP.  See below for further details.   Significant events: 2/20>> admit to [facility]   Significant studies: 2/20>> CXR: Increasing left basilar atelectasis/infiltrate. 2/21>> MRI left foot: Acute osteomyelitis-proximal phalanx second toe.   Significant microbiology data: 2/21>> blood culture: No growth 2/21>> influenza A: Positive 2/21>> COVID/influenza B/RSV PCR: Negative   Procedures: None   Consults: Nephrology Cardiology  Brief Hospital Course: Acute hypoxic/hypercarbic respiratory failure due to influenza A pneumonia and HFpEF exacerbation Required BiPAP initially-much improved with bronchodilators/Tamiflu/dialysis-either on room air or on 1-2 L of oxygen Continue supportive care and continue to treat underlying etiologies.     Severe  sepsis secondary to left foot cellulitis/acute second toe osteomyelitis/influenza infection with PNA Sepsis physiology improved All cultures negative so far Per Dr. Carloyn Jaeger does not need acute surgical intervention/treatment-per Dr. Briscoe Deutscher not need antibiotics-recommendations are to pursue wound care and outpatient follow-up.  Has completed almost 7 days of Rocephin/cefepime-has been on 4 days of doxycycline as well-suspect we can now discontinue-as overlying soft tissue infection from cellulitis has essentially resolved.   Acute metabolic encephalopathy Overall improved-etiology likely due to hypoxia/hypercarbia/sepsis.   Persistent A-fib with RVR Rate controlled with amiodarone infusion-has been transitioned to oral amiodarone  continue beta-blocker.  Cardiology consult during this hospitalization. Please ensure follow-up with cardiology.   Elevated troponins-likely due to demand ischemia Cardiology recommending supportive care.   ESRD on HD MWF Nephrology followed closely-outpatient dialysis as per usual schedule.   DM-2 with hypoglycemia (A1c 5.4 on 2/6) No further episodes of hypoglycemia Not on any oral hypoglycemics as an outpatient.  History of intraductal papillary mucinous neoplasm of the pancreas-s/p Whipple's Creon   History of renal cell carcinoma-s/p ablation-complicated by urine leak-s/p nephrostomy tube. Continue patient follow-up with IR for exchange.  Last exchanged on 2/17.   Debility/deconditioning SNF versus home health-TOC team following  Discharge Diagnoses:  Principal Problem:   Generalized weakness Active Problems:   Severe sepsis (HCC)   ESRD (end stage renal disease) (HCC)   Lactic acidosis   Paroxysmal atrial fibrillation (HCC)   DM2 (diabetes mellitus, type 2) (HCC)   Chronic diastolic CHF (congestive heart failure) (HCC)   History of anemia due to chronic kidney disease   Elevated troponin   Acute osteomyelitis (HCC)  Acute  on chronic respiratory failure with hypoxia and hypercapnia (HCC)   Osteomyelitis of second toe of left foot (HCC)   Discharge Instructions: CBGs before meals and at bedtime.  Activity:  As tolerated with Full fall precautions use walker/cane & assistance as needed  Discharge Instructions     Call MD for:  difficulty breathing, headache or visual disturbances   Complete by: As directed    Call MD for:  extreme fatigue   Complete by: As directed    Call MD for:  persistant dizziness or light-headedness   Complete by: As directed    Diet - low sodium heart healthy   Complete by: As directed    Discharge instructions   Complete by: As directed    Follow with Primary MD  Lewis Moccasin, MD in 1-2 weeks  Follow-up with hemodialysis clinic as instructed  Follow-up with Dr. Lajoyce Corners in the next-1 to 2 weeks.  Please get a complete blood count and chemistry panel checked by your Primary MD at your next visit, and again as instructed by your Primary MD.  Get Medicines reviewed and adjusted: Please take all your medications with you for your next visit with your Primary MD  Laboratory/radiological data: Please request your Primary MD to go over all hospital tests and procedure/radiological results at the follow up, please ask your Primary MD to get all Hospital records sent to his/her office.  In some cases, they will be blood work, cultures and biopsy results pending at the time of your discharge. Please request that your primary care M.D. follows up on these results.  Also Note the following: If you experience worsening of your admission symptoms, develop shortness of breath, life threatening emergency, suicidal or homicidal thoughts you must seek medical attention immediately by calling 911 or calling your MD immediately  if symptoms less severe.  You must read complete instructions/literature along with all the possible adverse reactions/side effects for all the Medicines you take  and that have been prescribed to you. Take any new Medicines after you have completely understood and accpet all the possible adverse reactions/side effects.   Do not drive when taking Pain medications or sleeping medications (Benzodaizepines)  Do not take more than prescribed Pain, Sleep and Anxiety Medications. It is not advisable to combine anxiety,sleep and pain medications without talking with your primary care practitioner  Special Instructions: If you have smoked or chewed Tobacco  in the last 2 yrs please stop smoking, stop any regular Alcohol  and or any Recreational drug use.  Wear Seat belts while driving.  Please note: You were cared for by a hospitalist during your hospital stay. Once you are discharged, your primary care physician will handle any further medical issues. Please note that NO REFILLS for any discharge medications will be authorized once you are discharged, as it is imperative that you return to your primary care physician (or establish a relationship with a primary care physician if you do not have one) for your post hospital discharge needs so that they can reassess your need for medications and monitor your lab values.   Increase activity slowly   Complete by: As directed    No wound care   Complete by: As directed       Allergies as of 04/19/2023       Reactions   Lopid [gemfibrozil] Other (See Comments)   Myalgia    Lotemax [loteprednol Etabonate] Rash   Skin burning  Blurry vision   Statins Other (  See Comments)   Rosuvastatin Muscle weakness   Norco [hydrocodone-acetaminophen] Other (See Comments)   Headaches  Tolerates acetaminophen    Other Diarrhea   Real Butter - diarrhea; patient still uses butter   Vibra-tab [doxycycline] Other (See Comments)   Unknown reaction   Amlodipine Other (See Comments)   Norvasc   Codeine Other (See Comments)   headache   Hydrocodone Other (See Comments)   Hydrocodone-acetaminophen Other (See Comments)    Headache. Tolerates acetaminophen.   Lisinopril Other (See Comments)   Verapamil Other (See Comments)        Medication List     STOP taking these medications    diphenhydramine-acetaminophen 25-500 MG Tabs tablet Commonly known as: TYLENOL PM   doxycycline 100 MG tablet Commonly known as: VIBRA-TABS   hydrALAZINE 50 MG tablet Commonly known as: APRESOLINE   metoprolol succinate 50 MG 24 hr tablet Commonly known as: TOPROL-XL   NovoLOG FlexPen 100 UNIT/ML FlexPen Generic drug: insulin aspart   Pataday 0.1 % ophthalmic solution Generic drug: olopatadine       TAKE these medications    acetaminophen 500 MG tablet Commonly known as: TYLENOL Take 1,000 mg by mouth every 6 (six) hours as needed for mild pain (pain score 1-3) or headache.   amiodarone 200 MG tablet Commonly known as: PACERONE Take 1 tablet (200 mg total) by mouth daily.   Aspirin Low Dose 81 MG chewable tablet Generic drug: aspirin Chew 1 tablet (81 mg total) by mouth daily. What changed: additional instructions   benzonatate 200 MG capsule Commonly known as: TESSALON Take 200 mg by mouth 3 (three) times daily as needed.   Creon 36000 UNITS Cpep capsule Generic drug: lipase/protease/amylase Take 36,000 Units by mouth See admin instructions. Take 36,000 units (1 capsule) by mouth with meals and snacks.   dextromethorphan-guaiFENesin 30-600 MG 12hr tablet Commonly known as: MUCINEX DM Take 1 tablet by mouth 2 (two) times daily as needed for cough.   DIALYVITE 800 WITH ZINC 0.8 MG Tabs Take 1 tablet by mouth daily.   lidocaine-prilocaine cream Commonly known as: EMLA Apply 1 Application topically every Monday, Wednesday, and Friday with hemodialysis.   Lifitegrast 5 % Soln Administer 1 drop into both eyes in the morning and 1 drop before bedtime.   meclizine 25 MG tablet Commonly known as: ANTIVERT Take 1 tablet (25 mg total) by mouth 3 (three) times daily as needed for dizziness.    metoprolol tartrate 25 MG tablet Commonly known as: LOPRESSOR Take 1 tablet (25 mg total) by mouth 2 (two) times daily.   midodrine 10 MG tablet Commonly known as: PROAMATINE Take 0.5 tablets (5 mg total) by mouth 3 (three) times daily. Take 10mg  by mouth before dialysis. Bring with you to dialysis in case another dose is required. May take 10 mg 3 times a day if systolic BP less than 120 mmHg What changed:  how much to take when to take this   pantoprazole 40 MG tablet Commonly known as: PROTONIX Take by mouth.   rOPINIRole 0.25 MG tablet Commonly known as: REQUIP Take 3 tablets (0.75 mg total) by mouth at bedtime.   sevelamer carbonate 800 MG tablet Commonly known as: RENVELA Take 1 tablet (800 mg total) by mouth See admin instructions. Take 800mg  by mouth with meals or snacks. What changed:  when to take this additional instructions Another medication with the same name was removed. Continue taking this medication, and follow the directions you see here.   SYSTANE HYDRATION  PF OP Place 1 drop into both eyes 3 (three) times daily.   Systane Nighttime Oint Apply 1 Application to eye at bedtime as needed (severe dry eyes).        Contact information for follow-up providers     Nadara Mustard, MD Follow up in 1 week(s).   Specialty: Orthopedic Surgery Contact information: [address] [place] [address] [phone]         Reather Littler D, NP Follow up.   Specialty: Cardiology Why: Hospital follow-up with Cardiology scheduled for 05/02/2023 at 10:55am. Please  arrive 15 minutes early for check-in. If this date/ time does not work for you, please call our office to reschedule. Contact information: [address] [place] [address] [place] [address] [phone]         [facility] Go on 04/21/2023.   Why: Schedule is Monday, Wednesday, Friday with 6:10 am chair time. Contact information: [address] [place]  [address] [phone]         Lewis Moccasin, MD. Schedule an appointment as soon as possible for a visit in 1 week(s).   Specialty: Family Medicine Contact information: [address] [place] [address] [phone]              Contact information for after-discharge care     Destination     [facility] Preferred SNF .   Service: Skilled Nursing Contact information: [address] [place] [address] [phone]                    Allergies  Allergen Reactions   Lopid [Gemfibrozil] Other (See Comments)    Myalgia    Lotemax [Loteprednol Etabonate] Rash    Skin burning  Blurry vision   Statins Other (See Comments)    Rosuvastatin Muscle weakness   Norco [Hydrocodone-Acetaminophen] Other (See Comments)    Headaches  Tolerates acetaminophen    Other Diarrhea    Real Butter - diarrhea; patient still uses butter   Vibra-Tab [Doxycycline] Other (See Comments)    Unknown reaction   Amlodipine Other (See Comments)    Norvasc   Codeine Other (See Comments)    headache   Hydrocodone Other (See Comments)   Hydrocodone-Acetaminophen Other (See Comments)    Headache. Tolerates acetaminophen.   Lisinopril Other (See Comments)   Verapamil Other (See Comments)     Other Procedures/Studies: MR FOOT LEFT WO CONTRAST Result Date: 04/11/2023 CLINICAL DATA:  Left second toe infection. EXAM: MRI OF THE LEFT FOOT WITHOUT CONTRAST TECHNIQUE: Multiplanar, multisequence MR imaging of the left forefoot was performed. No intravenous contrast was administered. COMPARISON:  03/27/2023 and radiographs from 04/10/2023 FINDINGS: Bones/Joint/Cartilage Progressive abnormal edema distally in the proximal phalanx second toe compatible with acute osteomyelitis. There is also accentuated T2 signal in the fused proximal and middle phalanges of the small toe suggesting osteomyelitis. Low-level marrow edema in the tuft of the  distal phalanx third toe potentially from osteomyelitis. Postoperative findings in the first metatarsal with medial erosions along the first metatarsal head and nonspecific marrow edema dorsally along the first metatarsal head dorsally along the base of the proximal phalanx great toe similar to the previous exam. Appearance favors arthropathy with infection less likely given the lack of a joint effusion. Underlying gout is a distinct possibility given the medial erosions. Flattened/collapse head of the third metatarsal with arthropathy at the third MTP joint. Metal artifact distally in the second metatarsal compatible with prior operative  intervention. Ligaments Lisfranc ligament intact. Muscles and Tendons Regional muscular edema likely neuropathic. Soft tissues Worsening dorsal subcutaneous edema in the forefoot, cellulitis is not excluded. Diffuse subcutaneous edema along the ball of the foot. Edema tracks into all of the toes. IMPRESSION: 1. Progressive abnormal edema distally in the proximal phalanx second toe compatible with acute osteomyelitis. 2. Accentuated T2 signal in the fused proximal and middle phalanges of the small toe suggesting osteomyelitis. 3. Low-level marrow edema in the tuft of the distal phalanx third toe potentially from osteomyelitis. 4. Postoperative findings in the first metatarsal with medial erosions along the first metatarsal head and nonspecific marrow edema dorsally along the first metatarsal head and dorsally along the base of the proximal phalanx great toe similar to the previous exam. Appearance favors arthropathy with infection less likely given the lack of a joint effusion. Underlying gout is a distinct possibility given the medial erosions. 5. Flattened/collapse head of the third metatarsal with arthropathy at the third MTP joint. 6. Worsening dorsal subcutaneous edema in the forefoot, cellulitis is not excluded. Diffuse subcutaneous edema along the ball of the foot. 7.  Regional muscular edema likely neuropathic. Electronically Signed   By: Gaylyn Rong M.D.   On: 04/11/2023 13:25   ECHOCARDIOGRAM LIMITED Result Date: 04/11/2023    ECHOCARDIOGRAM LIMITED REPORT   Patient Name:   Arlyce P Adelstein Date of Exam: 04/11/2023 Medical Rec #:  [id number]        Height:       63.0 in Accession #:    [id number]       Weight:       112.4 lb Date of Birth:  1935/06/17        BSA:          1.514 m Patient Age:    87 years         BP:           98/58 mmHg Patient Gender: F                HR:           86 bpm. Exam Location:  Inpatient Procedure: Limited Echo, Limited Color Doppler and Color Doppler (Both Spectral            and Color Flow Doppler were utilized during procedure). Indications:    CHF                 Sepsis  History:        Patient has prior history of Echocardiogram examinations, most                 recent 01/30/2023. CHF, CAD, Arrythmias:Atrial Fibrillation;                 Risk Factors:Diabetes.  Sonographer:    Amy Chionchio Referring Phys: [id number] GRACE E BOWSER IMPRESSIONS  1. Left ventricular ejection fraction, by estimation, is 55 to 60%. The left ventricle has normal function. The left ventricle has no regional wall motion abnormalities. Left ventricular diastolic parameters are indeterminate. There is the interventricular septum is flattened in systole and diastole, consistent with right ventricular pressure and volume overload.  2. Right ventricular systolic function is moderately reduced. The right ventricular size is mildly enlarged. There is severely elevated pulmonary artery systolic pressure.  3. Left atrial size was moderately dilated.  4. Right atrial size was mildly dilated.  5. The mitral valve is normal in structure. Mild mitral valve regurgitation. No evidence of mitral stenosis.  Severe mitral annular calcification.  6. Tricuspid valve regurgitation is moderate.  7. The aortic valve is tricuspid. Aortic valve regurgitation is not visualized. Aortic  valve sclerosis is present, with no evidence of aortic valve stenosis.  8. The inferior vena cava is dilated in size with <50% respiratory variability, suggesting right atrial pressure of 15 mmHg. FINDINGS  Left Ventricle: Left ventricular ejection fraction, by estimation, is 55 to 60%. The left ventricle has normal function. The left ventricle has no regional wall motion abnormalities. Strain imaging was not performed. The left ventricular internal cavity  size was normal in size. There is no left ventricular hypertrophy. The interventricular septum is flattened in systole and diastole, consistent with right ventricular pressure and volume overload. Left ventricular diastolic parameters are indeterminate. Right Ventricle: The right ventricular size is mildly enlarged. Right ventricular systolic function is moderately reduced. There is severely elevated pulmonary artery systolic pressure. The tricuspid regurgitant velocity is 3.44 m/s, and with an assumed right atrial pressure of 15 mmHg, the estimated right ventricular systolic pressure is 62.3 mmHg. Left Atrium: Left atrial size was moderately dilated. Right Atrium: Right atrial size was mildly dilated. Pericardium: There is no evidence of pericardial effusion. Mitral Valve: The mitral valve is normal in structure. Severe mitral annular calcification. Mild mitral valve regurgitation. No evidence of mitral valve stenosis. MV peak gradient, 5.5 mmHg. The mean mitral valve gradient is 1.0 mmHg. Tricuspid Valve: The tricuspid valve is normal in structure. Tricuspid valve regurgitation is moderate . No evidence of tricuspid stenosis. Aortic Valve: The aortic valve is tricuspid. Aortic valve regurgitation is not visualized. Aortic valve sclerosis is present, with no evidence of aortic valve stenosis. Pulmonic Valve: The pulmonic valve was normal in structure. Pulmonic valve regurgitation is trivial. No evidence of pulmonic stenosis. Aorta: The aortic root is normal in  size and structure. Venous: The inferior vena cava is dilated in size with less than 50% respiratory variability, suggesting right atrial pressure of 15 mmHg. IAS/Shunts: No atrial level shunt detected by color flow Doppler. Additional Comments: 3D imaging was not performed.  LEFT VENTRICLE PLAX 2D LVOT diam:     1.90 cm LV SV:         32 LV SV Index:   21 LVOT Area:     2.84 cm  LV Volumes (MOD) LV vol d, MOD A2C: 60.2 ml LV vol d, MOD A4C: 50.8 ml LV vol s, MOD A2C: 30.6 ml LV vol s, MOD A4C: 23.8 ml LV SV MOD A2C:     29.6 ml LV SV MOD A4C:     50.8 ml LV SV MOD BP:      28.9 ml RIGHT VENTRICLE          IVC RV Basal diam:  3.40 cm  IVC diam: 2.40 cm RV Mid diam:    2.60 cm TAPSE (M-mode): 0.5 cm RIGHT ATRIUM           Index RA Area:     18.80 cm RA Volume:   51.20 ml  33.82 ml/m  AORTIC VALVE LVOT Vmax:   63.90 cm/s LVOT Vmean:  46.700 cm/s LVOT VTI:    0.113 m MITRAL VALVE             TRICUSPID VALVE MV Area VTI:  1.32 cm   TR Peak grad:   47.3 mmHg MV Peak grad: 5.5 mmHg   TR Vmax:        344.00 cm/s MV Mean grad: 1.0 mmHg MV Vmax:  1.17 m/s   SHUNTS MV Vmean:     49.1 cm/s  Systemic VTI:  0.11 m                          Systemic Diam: 1.90 cm Olga Millers MD Electronically signed by Olga Millers MD Signature Date/Time: 04/11/2023/12:22:46 PM    Final    DG CHEST PORT 1 VIEW Result Date: 04/11/2023 CLINICAL DATA:  Hypoxia EXAM: PORTABLE CHEST 1 VIEW COMPARISON:  04/10/2023 FINDINGS: Right dialysis catheter remains in place, unchanged. Heart is borderline in size. Mediastinal contours within normal limits. Aortic atherosclerosis. Increasing left basilar opacity. Right lung clear. No effusions or edema. No acute bony abnormality. IMPRESSION: Increasing left basilar atelectasis or infiltrate. Electronically Signed   By: Charlett Nose M.D.   On: 04/11/2023 03:03   DG Foot Complete Left Result Date: 04/10/2023 CLINICAL DATA:  Shortness of breath.  Bilateral toe pain. EXAM: LEFT FOOT - COMPLETE 3+  VIEW COMPARISON:  03/26/2023. FINDINGS: No acute fracture or dislocation. No aggressive osseous lesion. Mild-to-moderate hallux valgus deformity. Redemonstration of metallic K-wire/screw overlying the distal first and second metatarsals. Mild diffuse degenerative changes of imaged joints. There is flattening and fragmentation of the head of the third metatarsal, likely sequela of prior trauma versus avascular necrosis. No focal soft tissue swelling. No focal soft tissue defect or air within the soft tissue. No radiopaque foreign bodies. IMPRESSION: *No acute osseous abnormality of the left foot. Correlate clinically to determine the need for additional imaging with MRI. Electronically Signed   By: Jules Schick M.D.   On: 04/10/2023 18:45   DG Chest 2 View Result Date: 04/10/2023 CLINICAL DATA:  Shortness of breath. EXAM: CHEST - 2 VIEW COMPARISON:  03/26/2023. FINDINGS: Low lung volume. Bilateral lung fields are clear. Bilateral costophrenic angles are clear. Normal cardio-mediastinal silhouette. No acute osseous abnormalities. The soft tissues are within normal limits. Right sided central venous catheter noted with its tip overlying the right atrium, unchanged. IMPRESSION: No active cardiopulmonary disease. Electronically Signed   By: Jules Schick M.D.   On: 04/10/2023 18:39   IR NEPHROSTOMY EXCHANGE LEFT Result Date: 04/07/2023 INDICATION: Chronic left nephrostomy tube. Patient presents for routine exchange. EXAM: LEFT NEPHROSTOMY TUBE EXCHANGE WITH FLUOROSCOPY Physician: Rachelle Hora. Lowella Dandy, MD COMPARISON:  None Available. MEDICATIONS: 1% lidocaine ANESTHESIA/SEDATION: None CONTRAST:  10 mL Omnipaque 300-administered into the collecting system(s) FLUOROSCOPY: Radiation Exposure Index (as provided by the fluoroscopic device): 1 mGy Kerma COMPLICATIONS: None immediate. PROCEDURE: The procedure was explained to the patient. The risks and benefits of the procedure were discussed and the patient's questions were  addressed. Informed consent was obtained from the patient. Patient was placed prone. The existing catheter and surrounding skin were prepped and draped in sterile fashion. Maximal barrier sterile technique was utilized including caps, mask, sterile gowns, sterile gloves, sterile drape, hand hygiene and skin antiseptic. Contrast was injected through the nephrostomy tube. Nephrostomy tube was cut and removed over a wire. New 10 French multipurpose drain was advanced over the wire and reconstituted in the renal pelvis. Contrast injection confirmed placement in the renal pelvis. Skin was anesthetized with 1% lidocaine. Nephrostomy tube was sutured to skin. Nephrostomy tube was flushed with saline and attached to a gravity bag. Fluoroscopic images were taken and saved for this procedure. FINDINGS: Again noted is a small abnormal left renal collecting system with occlusion at the left renal pelvis. Nephrostomy tube is coiled in the renal pelvis. Again noted are  embolization coils involving the left kidney. IMPRESSION: Successful exchange of left nephrostomy tube with fluoroscopy. Electronically Signed   By: Richarda Overlie M.D.   On: 04/07/2023 22:13   MR FOOT LEFT WO CONTRAST Result Date: 03/27/2023 CLINICAL DATA:  Foot erythema. Swelling associated with the second toe of the left foot. Chronic foot pain. Diabetes. Clinical concern for osteomyelitis. EXAM: MRI OF THE LEFT FOOT WITHOUT CONTRAST TECHNIQUE: Multiplanar, multisequence MR imaging of the left forefoot was performed. No intravenous contrast was administered. COMPARISON:  left foot radiographs 03/26/2023, 04/19/2021 FINDINGS: Despite efforts by the technologist and patient, mild-to-moderate motion artifact is present on today's exam and could not be eliminated. This reduces exam sensitivity and specificity. Bones/Joint/Cartilage There is metallic artifact from a pin within the distal first metatarsal and a screw within the second metatarsal head, obscuring the  surrounding regions. There is moderate focal marrow edema within the distal aspect of the proximal phalanx of the second toe (sagittal series 11, image 13, coronal series 9, image 17). Mild marrow edema within the distal third of the great toe (sagittal series 11, image 10 and coronal series 9, image 15). Moderately intense small region of edema within the distal aspect of the distal phalanx of the third toe (sagittal series 11, image 17 and coronal series 9, image 17). There is mild marrow edema seen throughout the congenitally fused middle and distal phalanges of the fifth toe. Mild-to-moderate hallux valgus. Moderate great toe metatarsophalangeal cartilage thinning,a prominent dorsal subchondral marrow edema, and peripheral osteophytosis osteoarthritis. 5 mm degenerative cyst at the medial aspect of the great toe metatarsal head. Ligaments The Lisfranc ligament complex is intact. The metatarsophalangeal and interphalangeal cartilage elements appear intact. Muscles and Tendons There is moderate edema throughout the plantar midfoot musculature with mild fluid in between the flexor digitorum longus tendons and the flexor digitorum brevis muscles and tendons (axial series 8, image 40). Soft tissues Motion artifact and inhomogeneous fat saturation limits evaluation of the toe soft tissues. There is mild edema and soft tissue swelling of the second toe. Probable mild edema and swelling of the distal aspect of the rest of the toes as well. No significant ulceration is identified. IMPRESSION: 1. Motion artifact and metallic susceptibility artifact limited examination. 2. There is soft tissue swelling of the second toe. There is moderate focal marrow edema within the distal aspect of the proximal phalanx of the second toe. This is nonspecific and may be reactive or secondary to early osteomyelitis. Recommend clinical correlation for concern for infection of the second toe. 3. Mild marrow edema within the distal third of  the great toe, moderately intense small region of edema within the distal aspect of the distal phalanx of the third toe, and mild marrow edema throughout the congenitally fused middle and distal phalanges of the fifth toe. The technical limitations of the study limits evaluation for soft tissue ulceration recommend clinical correlation for concern for infection within these toes, which would raise concern for the marrow edema representing early osteomyelitis. 4. Mild-to-moderate hallux valgus. Moderate great toe metatarsophalangeal osteoarthritis. Electronically Signed   By: Neita Garnet M.D.   On: 03/27/2023 14:20   DG Foot Complete Left Result Date: 03/26/2023 CLINICAL DATA:  wound to 2nd toe. EXAM: LEFT FOOT - COMPLETE 3+ VIEW COMPARISON:  04/19/2021. FINDINGS: No acute fracture or dislocation. Please note evaluation of the distal phalanx of second toe is limited due to overlapping with the adjacent toes. However, no discrete focal bone destruction/erosion noted. Mild-to-moderate hallux valgus deformity  noted. Metallic K-wire/screws noted overlying the distal portion of the first and second metatarsals, similar to the prior study. Ankle mortise appears intact. No focal soft tissue swelling. No evidence of air within soft tissue. No radiopaque foreign bodies. IMPRESSION: No radiographic evidence of acute osteomyelitis involving the second toe. Correlate clinically to determine the need for additional imaging with MRI. Electronically Signed   By: Jules Schick M.D.   On: 03/26/2023 16:07   DG Chest Port 1 View Result Date: 03/26/2023 CLINICAL DATA:  Questionable sepsis - evaluate for abnormality. Fever. Headache. EXAM: PORTABLE CHEST 1 VIEW COMPARISON:  01/10/2023. FINDINGS: Low lung volume. There are probable atelectatic changes in the left lung base. Bilateral lung fields are otherwise clear. No dense consolidation or lung collapse. No pulmonary edema. Bilateral costophrenic angles are clear. Stable  cardio-mediastinal silhouette. No acute osseous abnormalities. The soft tissues are within normal limits. Right-sided central venous catheter noted with its tip overlying the right atrium region. There is an additional tubing overlying the left upper abdomen. IMPRESSION: No active disease. Electronically Signed   By: Jules Schick M.D.   On: 03/26/2023 16:03     TODAY-DAY OF DISCHARGE:  Subjective:   Pat Elicker today has no headache,no chest abdominal pain,no new weakness tingling or numbness, feels much better wants to go home today.   Objective:   Blood pressure 122/86, pulse 88, temperature (!) 97.4 F (36.3 C), temperature source Oral, resp. rate 18, weight 51 kg, SpO2 94%.  Intake/Output Summary (Last 24 hours) at 04/19/2023 0959 Last data filed at 04/18/2023 1130 Gross per 24 hour  Intake --  Output 3000 ml  Net -3000 ml   Filed Weights   04/18/23 1122 04/18/23 1130 04/19/23 0500  Weight: 50.2 kg 50.2 kg 51 kg    Exam: Awake Alert, Oriented *3, No new F.N deficits, Normal affect [place].AT,PERRAL Supple Neck,No JVD, No cervical lymphadenopathy appriciated.  Symmetrical Chest wall movement, Good air movement bilaterally, CTAB RRR,No Gallops,Rubs or new Murmurs, No Parasternal Heave +ve B.Sounds, Abd Soft, Non tender, No organomegaly appriciated, No rebound -guarding or rigidity. No Cyanosis, Clubbing or edema, No new Rash or bruise   PERTINENT RADIOLOGIC STUDIES: No results found.   PERTINENT LAB RESULTS: CBC: Recent Labs    04/16/23 1049 04/17/23 0938  WBC 12.7* 13.4*  HGB 12.6 12.6  HCT 41.2 41.2  PLT 193 201   CMET CMP     Component Value Date/Time   NA 130 (L) 04/17/2023 0938   K 4.9 04/17/2023 0938   CL 90 (L) 04/17/2023 0938   CO2 19 (L) 04/17/2023 0938   GLUCOSE 114 (H) 04/17/2023 0938   BUN 40 (H) 04/17/2023 0938   CREATININE 4.55 (H) 04/17/2023 0938   CREATININE 2.37 (H) 06/21/2022 1204   CALCIUM 9.9 04/17/2023 0938   CALCIUM 7.6 (L)  02/23/2021 1204   PROT 5.4 (L) 04/11/2023 0515   PROT 6.1 02/25/2022 1426   ALBUMIN 2.6 (L) 04/17/2023 0938   ALBUMIN 3.3 (L) 02/25/2022 1426   AST 24 04/11/2023 0515   AST 19 06/21/2022 1204   ALT 29 04/11/2023 0515   ALT 21 06/21/2022 1204   ALKPHOS 61 04/11/2023 0515   BILITOT 1.2 04/11/2023 0515   BILITOT 0.3 06/21/2022 1204   GFRNONAA 9 (L) 04/17/2023 0938   GFRNONAA 20 (L) 06/21/2022 1204    GFR Estimated Creatinine Clearance: 7.3 mL/min (A) (by C-G formula based on SCr of 4.55 mg/dL (H)). No results for input(s): "LIPASE", "AMYLASE" in the last 72  hours. No results for input(s): "CKTOTAL", "CKMB", "CKMBINDEX", "TROPONINI" in the last 72 hours. Invalid input(s): "POCBNP" No results for input(s): "DDIMER" in the last 72 hours. No results for input(s): "HGBA1C" in the last 72 hours. No results for input(s): "CHOL", "HDL", "LDLCALC", "TRIG", "CHOLHDL", "LDLDIRECT" in the last 72 hours. No results for input(s): "TSH", "T4TOTAL", "T3FREE", "THYROIDAB" in the last 72 hours.  Invalid input(s): "FREET3" No results for input(s): "VITAMINB12", "FOLATE", "FERRITIN", "TIBC", "IRON", "RETICCTPCT" in the last 72 hours. Coags: No results for input(s): "INR" in the last 72 hours.  Invalid input(s): "PT" Microbiology: Recent Results (from the past 240 hours)  Respiratory (~20 pathogens) panel by PCR     Status: Abnormal   Collection Time: 04/11/23  9:52 AM   Specimen: Nasopharyngeal Swab; Respiratory  Result Value Ref Range Status   Adenovirus NOT DETECTED NOT DETECTED Final   Coronavirus 229E NOT DETECTED NOT DETECTED Final    Comment: (NOTE) The Coronavirus on the Respiratory Panel, DOES NOT test for the novel  Coronavirus (2019 nCoV)    Coronavirus HKU1 NOT DETECTED NOT DETECTED Final   Coronavirus NL63 NOT DETECTED NOT DETECTED Final   Coronavirus OC43 NOT DETECTED NOT DETECTED Final   Metapneumovirus NOT DETECTED NOT DETECTED Final   Rhinovirus / Enterovirus NOT DETECTED NOT  DETECTED Final   Influenza A H3 DETECTED (A) NOT DETECTED Final   Influenza B NOT DETECTED NOT DETECTED Final   Parainfluenza Virus 1 NOT DETECTED NOT DETECTED Final   Parainfluenza Virus 2 NOT DETECTED NOT DETECTED Final   Parainfluenza Virus 3 NOT DETECTED NOT DETECTED Final   Parainfluenza Virus 4 NOT DETECTED NOT DETECTED Final   Respiratory Syncytial Virus NOT DETECTED NOT DETECTED Final   Bordetella pertussis NOT DETECTED NOT DETECTED Final   Bordetella Parapertussis NOT DETECTED NOT DETECTED Final   Chlamydophila pneumoniae NOT DETECTED NOT DETECTED Final   Mycoplasma pneumoniae NOT DETECTED NOT DETECTED Final    Comment: Performed at [facility], [address], [place], [place] [address]  Resp panel by RT-PCR (RSV, Flu A&B, Covid) Nasopharyngeal Swab     Status: Abnormal   Collection Time: 04/11/23  9:52 AM   Specimen: Nasopharyngeal Swab; Nasal Swab  Result Value Ref Range Status   SARS Coronavirus 2 by RT PCR NEGATIVE NEGATIVE Final   Influenza A by PCR POSITIVE (A) NEGATIVE Final   Influenza B by PCR NEGATIVE NEGATIVE Final    Comment: (NOTE) The Xpert Xpress SARS-CoV-2/FLU/RSV plus assay is intended as an aid in the diagnosis of influenza from Nasopharyngeal swab specimens and should not be used as a sole basis for treatment. Nasal washings and aspirates are unacceptable for Xpert Xpress SARS-CoV-2/FLU/RSV testing.  Fact Sheet for Patients: BloggerCourse.com  Fact Sheet for Healthcare Providers: SeriousBroker.it  This test is not yet approved or cleared by the [place] FDA and has been authorized for detection and/or diagnosis of SARS-CoV-2 by FDA under an Emergency Use Authorization (EUA). This EUA will remain in effect (meaning this test can be used) for the duration of the COVID-19 declaration under Section 564(b)(1) of the Act, 21 U.S.C. section 360bbb-3(b)(1), unless the authorization is terminated  or revoked.     Resp Syncytial Virus by PCR NEGATIVE NEGATIVE Final    Comment: (NOTE) Fact Sheet for Patients: BloggerCourse.com  Fact Sheet for Healthcare Providers: SeriousBroker.it  This test is not yet approved or cleared by the [place] FDA and has been authorized for detection and/or diagnosis of SARS-CoV-2 by FDA under  an Emergency Use Authorization (EUA). This EUA will remain in effect (meaning this test can be used) for the duration of the COVID-19 declaration under Section 564(b)(1) of the Act, 21 U.S.C. section 360bbb-3(b)(1), unless the authorization is terminated or revoked.  Performed at [facility], [address] [place], [place] [address]   Culture, blood (Routine X 2) w Reflex to ID Panel     Status: None   Collection Time: 04/11/23  8:50 PM   Specimen: BLOOD  Result Value Ref Range Status   Specimen Description BLOOD SITE NOT SPECIFIED  Final   Special Requests   Final    BOTTLES DRAWN AEROBIC AND ANAEROBIC Blood Culture results may not be optimal due to an inadequate volume of blood received in culture bottles   Culture   Final    NO GROWTH 5 DAYS Performed at [facility], [address] [place], [place] [address]    Report Status 04/16/2023 FINAL  Final  Culture, blood (Routine X 2) w Reflex to ID Panel     Status: None   Collection Time: 04/11/23 11:52 PM   Specimen: BLOOD RIGHT HAND  Result Value Ref Range Status   Specimen Description BLOOD RIGHT HAND  Final   Special Requests   Final    BOTTLES DRAWN AEROBIC ONLY Blood Culture results may not be optimal due to an inadequate volume of blood received in culture bottles   Culture   Final    NO GROWTH 5 DAYS Performed at [facility], [address], [place], [place] [address]    Report Status 04/17/2023 FINAL  Final    FURTHER DISCHARGE INSTRUCTIONS:  Get Medicines reviewed and adjusted: Please take all your  medications with you for your next visit with your Primary MD  Laboratory/radiological data: Please request your Primary MD to go over all hospital tests and procedure/radiological results at the follow up, please ask your Primary MD to get all Hospital records sent to his/her office.  In some cases, they will be blood work, cultures and biopsy results pending at the time of your discharge. Please request that your primary care M.D. goes through all the records of your hospital data and follows up on these results.  Also Note the following: If you experience worsening of your admission symptoms, develop shortness of breath, life threatening emergency, suicidal or homicidal thoughts you must seek medical attention immediately by calling 911 or calling your MD immediately  if symptoms less severe.  You must read complete instructions/literature along with all the possible adverse reactions/side effects for all the Medicines you take and that have been prescribed to you. Take any new Medicines after you have completely understood and accpet all the possible adverse reactions/side effects.   Do not drive when taking Pain medications or sleeping medications (Benzodaizepines)  Do not take more than prescribed Pain, Sleep and Anxiety Medications. It is not advisable to combine anxiety,sleep and pain medications without talking with your primary care practitioner  Special Instructions: If you have smoked or chewed Tobacco  in the last 2 yrs please stop smoking, stop any regular Alcohol  and or any Recreational drug use.  Wear Seat belts while driving.  Please note: You were cared for by a hospitalist during your hospital stay. Once you are discharged, your primary care physician will handle any further medical issues. Please note that NO REFILLS for any discharge medications will be authorized once you are discharged, as it is imperative that  you return to your primary care physician (or establish a  relationship with a primary care physician if you do not have one) for your post hospital discharge needs so that they can reassess your need for medications and monitor your lab values.  Total Time spent coordinating discharge including counseling, education and face to face time equals greater than 30 minutes.  SignedJeoffrey Massed 04/19/2023 9:59 AM

## 2023-04-19 DIAGNOSIS — R6520 Severe sepsis without septic shock: Secondary | ICD-10-CM | POA: Diagnosis not present

## 2023-04-19 DIAGNOSIS — L97524 Non-pressure chronic ulcer of other part of left foot with necrosis of bone: Secondary | ICD-10-CM | POA: Diagnosis not present

## 2023-04-19 DIAGNOSIS — L03039 Cellulitis of unspecified toe: Secondary | ICD-10-CM | POA: Diagnosis not present

## 2023-04-19 DIAGNOSIS — E891 Postprocedural hypoinsulinemia: Secondary | ICD-10-CM | POA: Diagnosis not present

## 2023-04-19 DIAGNOSIS — N2581 Secondary hyperparathyroidism of renal origin: Secondary | ICD-10-CM | POA: Diagnosis not present

## 2023-04-19 DIAGNOSIS — I4819 Other persistent atrial fibrillation: Secondary | ICD-10-CM | POA: Diagnosis not present

## 2023-04-19 DIAGNOSIS — S8012XA Contusion of left lower leg, initial encounter: Secondary | ICD-10-CM | POA: Diagnosis not present

## 2023-04-19 DIAGNOSIS — Z7401 Bed confinement status: Secondary | ICD-10-CM | POA: Diagnosis not present

## 2023-04-19 DIAGNOSIS — I739 Peripheral vascular disease, unspecified: Secondary | ICD-10-CM | POA: Diagnosis not present

## 2023-04-19 DIAGNOSIS — L03116 Cellulitis of left lower limb: Secondary | ICD-10-CM | POA: Diagnosis not present

## 2023-04-19 DIAGNOSIS — G2581 Restless legs syndrome: Secondary | ICD-10-CM | POA: Diagnosis not present

## 2023-04-19 DIAGNOSIS — I120 Hypertensive chronic kidney disease with stage 5 chronic kidney disease or end stage renal disease: Secondary | ICD-10-CM | POA: Diagnosis not present

## 2023-04-19 DIAGNOSIS — I10 Essential (primary) hypertension: Secondary | ICD-10-CM | POA: Diagnosis not present

## 2023-04-19 DIAGNOSIS — Z992 Dependence on renal dialysis: Secondary | ICD-10-CM | POA: Diagnosis not present

## 2023-04-19 DIAGNOSIS — I4891 Unspecified atrial fibrillation: Secondary | ICD-10-CM | POA: Diagnosis not present

## 2023-04-19 DIAGNOSIS — M8610 Other acute osteomyelitis, unspecified site: Secondary | ICD-10-CM | POA: Diagnosis not present

## 2023-04-19 DIAGNOSIS — R531 Weakness: Secondary | ICD-10-CM | POA: Diagnosis not present

## 2023-04-19 DIAGNOSIS — Z436 Encounter for attention to other artificial openings of urinary tract: Secondary | ICD-10-CM | POA: Diagnosis present

## 2023-04-19 DIAGNOSIS — A419 Sepsis, unspecified organism: Secondary | ICD-10-CM | POA: Diagnosis not present

## 2023-04-19 DIAGNOSIS — I5042 Chronic combined systolic (congestive) and diastolic (congestive) heart failure: Secondary | ICD-10-CM | POA: Diagnosis not present

## 2023-04-19 DIAGNOSIS — L089 Local infection of the skin and subcutaneous tissue, unspecified: Secondary | ICD-10-CM | POA: Diagnosis not present

## 2023-04-19 DIAGNOSIS — J189 Pneumonia, unspecified organism: Secondary | ICD-10-CM | POA: Diagnosis not present

## 2023-04-19 DIAGNOSIS — I951 Orthostatic hypotension: Secondary | ICD-10-CM | POA: Diagnosis not present

## 2023-04-19 DIAGNOSIS — J9601 Acute respiratory failure with hypoxia: Secondary | ICD-10-CM | POA: Diagnosis not present

## 2023-04-19 DIAGNOSIS — E11621 Type 2 diabetes mellitus with foot ulcer: Secondary | ICD-10-CM | POA: Diagnosis not present

## 2023-04-19 DIAGNOSIS — M869 Osteomyelitis, unspecified: Secondary | ICD-10-CM | POA: Diagnosis not present

## 2023-04-19 DIAGNOSIS — M86172 Other acute osteomyelitis, left ankle and foot: Secondary | ICD-10-CM | POA: Diagnosis not present

## 2023-04-19 DIAGNOSIS — S81012D Laceration without foreign body, left knee, subsequent encounter: Secondary | ICD-10-CM | POA: Diagnosis not present

## 2023-04-19 DIAGNOSIS — D631 Anemia in chronic kidney disease: Secondary | ICD-10-CM | POA: Diagnosis not present

## 2023-04-19 DIAGNOSIS — R8281 Pyuria: Secondary | ICD-10-CM | POA: Diagnosis not present

## 2023-04-19 DIAGNOSIS — R6521 Severe sepsis with septic shock: Secondary | ICD-10-CM | POA: Diagnosis not present

## 2023-04-19 DIAGNOSIS — S41011D Laceration without foreign body of right shoulder, subsequent encounter: Secondary | ICD-10-CM | POA: Diagnosis not present

## 2023-04-19 DIAGNOSIS — N186 End stage renal disease: Secondary | ICD-10-CM | POA: Diagnosis not present

## 2023-04-19 DIAGNOSIS — K219 Gastro-esophageal reflux disease without esophagitis: Secondary | ICD-10-CM | POA: Diagnosis not present

## 2023-04-19 DIAGNOSIS — J9602 Acute respiratory failure with hypercapnia: Secondary | ICD-10-CM | POA: Diagnosis not present

## 2023-04-19 DIAGNOSIS — I2510 Atherosclerotic heart disease of native coronary artery without angina pectoris: Secondary | ICD-10-CM | POA: Diagnosis not present

## 2023-04-19 DIAGNOSIS — T83022A Displacement of nephrostomy catheter, initial encounter: Secondary | ICD-10-CM | POA: Diagnosis not present

## 2023-04-19 DIAGNOSIS — R7989 Other specified abnormal findings of blood chemistry: Secondary | ICD-10-CM | POA: Diagnosis not present

## 2023-04-19 DIAGNOSIS — L299 Pruritus, unspecified: Secondary | ICD-10-CM | POA: Diagnosis not present

## 2023-04-19 LAB — GLUCOSE, CAPILLARY: Glucose-Capillary: 77 mg/dL (ref 70–99)

## 2023-04-19 NOTE — TOC Transition Note (Signed)
 Transition of Care [facility]) - Discharge Note   Patient Details  Name: Kim Burns MRN: [id number] Date of Birth: Oct 11, 1935  [facility]) CM/SW Contact:  Jimmy Picket, LCSW Phone Number: 04/19/2023, 10:00 AM   Clinical Narrative:     Per MD patient ready for DC to [facility]. RN, patient, patient's family, and facility notified of DC. Discharge Summary and FL2 sent to facility. DC packet on chart. Ambulance transport requested for patient.    RN to call report to [phone].  CSW will sign off for now as social work intervention is no longer needed. Please consult [place] again if new needs arise.   Final next level of care: Skilled Nursing Facility Barriers to Discharge: Barriers Resolved   Patient Goals and CMS Choice Patient states their goals for this hospitalization and ongoing recovery are:: Return to home          Discharge Placement   Existing PASRR number confirmed : 04/18/23          Patient chooses bed at: [facility] Patient to be transferred to facility by: [facility] Name of family member notified: [name] (son) Patient and family notified of of transfer: 04/17/23  Discharge Plan and Services Additional resources added to the After Visit Summary for   In-house Referral: Clinical Social Work Discharge Planning Services: CM Consult            DME Arranged: N/A                    Social Drivers of Health (SDOH) Interventions SDOH Screenings   Food Insecurity: No Food Insecurity (04/12/2023)  Housing: Low Risk  (04/12/2023)  Transportation Needs: No Transportation Needs (04/12/2023)  Utilities: Not At Risk (04/12/2023)  Alcohol Screen: Low Risk  (01/08/2023)  Depression (PHQ2-9): Low Risk  (01/08/2023)  Financial Resource Strain: Low Risk  (04/02/2023)   Received from [facility]  Physical Activity: Inactive (02/06/2023)  Social Connections: Socially Isolated (04/12/2023)  Stress: No Stress Concern  Present (01/08/2023)  Tobacco Use: Low Risk  (04/10/2023)  Health Literacy: Adequate Health Literacy (01/08/2023)     Readmission Risk Interventions    04/18/2023    9:34 AM 04/16/2023    9:41 AM 01/02/2023    4:07 PM  Readmission Risk Prevention Plan  Transportation Screening  Complete Complete  Medication Review (RN Care Manager)  Complete Referral to Pharmacy  PCP or Specialist appointment within 3-5 days of discharge  Complete   HRI or Home Care Consult  Complete Complete  SW Recovery Care/Counseling Consult  Complete Complete  Palliative Care Screening  Not Applicable Not Applicable  Skilled Nursing Facility Complete Not Applicable Not Applicable

## 2023-04-19 NOTE — Plan of Care (Signed)
 Patient is alert and stable. CHG completed and linens and gown changed after bed bath.

## 2023-04-19 NOTE — Progress Notes (Addendum)
 [facility] Progress Note   Subjective:   Patient seen and examined at bedside.  Pleasantly confused.  Plan to go to SNF today.  Reports trouble swallowing binders. Does not like to powder options.  Believes she could chew one up.  Admits to cough, intermittently productive.  Denies CP, SOB, abdominal pain and n/v/d.    Objective Vitals:   04/18/23 2230 04/19/23 0500 04/19/23 0846 04/19/23 0850  BP: (!) 140/98 122/86    Pulse: 86 88    Resp: 14 16 18    Temp: 98 F (36.7 C) 98.2 F (36.8 C)  (!) 97.4 F (36.3 C)  TempSrc: Oral Oral  Oral  SpO2: 95% 94%    Weight:  51 kg     Physical Exam General:chronically ill appearing, thin female in NAD Heart:RRR, no mrg appreciated Lungs:CTAB, nml WOB on RA Abdomen:soft, NTND Extremities:no LE edema Dialysis Access: TDC, LU AVF+b/t  Filed Weights   04/18/23 1122 04/18/23 1130 04/19/23 0500  Weight: 50.2 kg 50.2 kg 51 kg    Intake/Output Summary (Last 24 hours) at 04/19/2023 1052 Last data filed at 04/18/2023 1130 Gross per 24 hour  Intake --  Output 3000 ml  Net -3000 ml    Additional Objective Labs: Basic Metabolic Panel: Recent Labs  Lab 04/14/23 0514 04/16/23 0847 04/17/23 0938  NA 131* 130* 130*  K 5.2* 4.9 4.9  CL 93* 90* 90*  CO2 20* 19* 19*  GLUCOSE 172* 116* 114*  BUN 46* 34* 40*  CREATININE 4.78* 3.77* 4.55*  CALCIUM 9.1 9.4 9.9  PHOS 7.4* 6.4* 8.1*   Liver Function Tests: Recent Labs  Lab 04/14/23 0514 04/16/23 0847 04/17/23 0938  ALBUMIN 2.7* 2.7* 2.6*   CBC: Recent Labs  Lab 04/13/23 0555 04/14/23 0514 04/16/23 1049 04/17/23 0938  WBC 12.8* 13.0* 12.7* 13.4*  NEUTROABS  --  12.2*  --   --   HGB 10.7* 10.7* 12.6 12.6  HCT 36.6 35.3* 41.2 41.2  MCV 95.8 93.6 91.4 90.9  PLT 192 199 193 201   Blood Culture    Component Value Date/Time   SDES BLOOD RIGHT HAND 04/11/2023 2352   SPECREQUEST  04/11/2023 2352    BOTTLES DRAWN AEROBIC ONLY Blood Culture results may not be optimal  due to an inadequate volume of blood received in culture bottles   CULT  04/11/2023 2352    NO GROWTH 5 DAYS Performed at [facility], [address], [place], [place] [address]    REPTSTATUS 04/17/2023 FINAL 04/11/2023 2352   CBG: Recent Labs  Lab 04/17/23 1441 04/17/23 1501 04/17/23 1657 04/17/23 2128 04/19/23 0848  GLUCAP 40* 140* 128* 92 77   Medications:  albumin human 25 g (04/11/23 1627)    amiodarone  200 mg Oral Daily   Chlorhexidine Gluconate Cloth  6 each Topical Q0600   doxercalciferol  3 mcg Intravenous Q M,W,F-HD   heparin injection (subcutaneous)  5,000 Units Subcutaneous Q8H   lipase/protease/amylase  36,000 Units Oral TID WC   metoprolol tartrate  25 mg Oral BID   midodrine  5 mg Oral TID WC   multivitamin  1 tablet Oral QHS   sevelamer carbonate  800 mg Oral TID WC    Dialysis Orders: [facility] MWF 3.5h   B350   50.5kg   2K bath   TDC+  AVF (using TDC) Heparin none - last OP HD 2/19, post wt 49.6kg  - getting to dry wt, sometimes under, very low IDWG's - hectorol 3 mcg -  venofer 50 weekly - mircera 225 mcg q2, last 2/17, due 3/03   Assessment/Plan: Sepsis - probable LLE cellulitis, s/p rocephin and doxycycline.  Acute hypoxic / hypercarbic resp failure - recent flu A, LLL opacity on CXR. Seen by pulm for consult. Sending RVP/ covid / flu, blood cx's. Prn bipap. S/p ABX for possible PNA. No longer on O2. Left foot ulcer - Seen by Dr. Lajoyce Corners, per his note, watch for now. Not a surgical candidate. Plan to f/u in outpatient. Persistent AF-cardiology following Not on AC.  ESRD - on MWF.  Off schedule earlier this week, resumed regular schedule yesterday.  Next HD on 04/21/23.   BP - home midodrine reduced to 5mg  TID during admission d/t increased BP.  On metoprolol 25mg  BID as well.  Volume - Does not appear overloaded.  UF 3L with HD yesterday.  Meeting EDW.   Anemia of esrd - Hb 10- 12 here, No indication for ESA. Secondary hyperparathyroidism - CCa in  range, phos elevated.  Cont IV vdra. Reports trouble swallowing Renvela pill, does not tolerate powder.  Will look at changing to chewable option as outpatient.  RCC - s/p ablation c/b urine leak s/p neph tube Nutrition: Low albumin, very frail. Add protein supplements.   Virgina Norfolk, PA-C [facility] 04/19/2023,10:52 AM  LOS: 9 days   Note reviewed.  Patient was discharge and seen by Virgina Norfolk, PA earlier today.  Agree with note and plans   Estanislado Emms, MD 11:40 AM  04/19/2023

## 2023-04-19 NOTE — Final Progress Note (Signed)
   Chart reviewed - recommended po amiodarone 200 mg daily. Follow-up scheduled for 3/14. No further suggestions at this time.  [facility] will sign off.   Medication Recommendations:  as above Other recommendations (labs, testing, etc):  none Follow up as an outpatient:  3/14 with Reather Littler, NP  Chrystie Nose, MD, [facility], FACP  [place]  [facility]  Medical Director of the Advanced Lipid Disorders &  Cardiovascular Risk Reduction Clinic Diplomate of the American Board of Clinical Lipidology Attending Cardiologist  Direct Dial: [phone]  Fax: [phone]  Website:  [email or web address]

## 2023-04-19 NOTE — Progress Notes (Signed)
 Brief note No major issues overnight-did not go to SNF yesterday due to transportation/logistic issues.   Sleeping comfortably-vital signs are stable-easily awakens-no change in exam-appears frail/weak but otherwise stable to be discharged today.  See discharge summary from yesterday.

## 2023-04-21 DIAGNOSIS — I10 Essential (primary) hypertension: Secondary | ICD-10-CM | POA: Diagnosis not present

## 2023-04-21 DIAGNOSIS — G2581 Restless legs syndrome: Secondary | ICD-10-CM | POA: Diagnosis not present

## 2023-04-21 DIAGNOSIS — I120 Hypertensive chronic kidney disease with stage 5 chronic kidney disease or end stage renal disease: Secondary | ICD-10-CM | POA: Diagnosis not present

## 2023-04-21 DIAGNOSIS — N2581 Secondary hyperparathyroidism of renal origin: Secondary | ICD-10-CM | POA: Diagnosis not present

## 2023-04-21 DIAGNOSIS — N186 End stage renal disease: Secondary | ICD-10-CM | POA: Diagnosis not present

## 2023-04-21 DIAGNOSIS — L299 Pruritus, unspecified: Secondary | ICD-10-CM | POA: Diagnosis not present

## 2023-04-21 DIAGNOSIS — I4891 Unspecified atrial fibrillation: Secondary | ICD-10-CM | POA: Diagnosis not present

## 2023-04-21 DIAGNOSIS — R7989 Other specified abnormal findings of blood chemistry: Secondary | ICD-10-CM | POA: Diagnosis not present

## 2023-04-21 DIAGNOSIS — R6521 Severe sepsis with septic shock: Secondary | ICD-10-CM | POA: Diagnosis not present

## 2023-04-21 DIAGNOSIS — Z992 Dependence on renal dialysis: Secondary | ICD-10-CM | POA: Diagnosis not present

## 2023-04-21 DIAGNOSIS — J189 Pneumonia, unspecified organism: Secondary | ICD-10-CM | POA: Diagnosis not present

## 2023-04-21 DIAGNOSIS — L03039 Cellulitis of unspecified toe: Secondary | ICD-10-CM | POA: Diagnosis not present

## 2023-04-21 DIAGNOSIS — L089 Local infection of the skin and subcutaneous tissue, unspecified: Secondary | ICD-10-CM | POA: Diagnosis not present

## 2023-04-21 DIAGNOSIS — K219 Gastro-esophageal reflux disease without esophagitis: Secondary | ICD-10-CM | POA: Diagnosis not present

## 2023-04-22 ENCOUNTER — Telehealth: Payer: Self-pay | Admitting: *Deleted

## 2023-04-22 ENCOUNTER — Other Ambulatory Visit: Payer: Self-pay | Admitting: *Deleted

## 2023-04-22 ENCOUNTER — Telehealth: Payer: Self-pay

## 2023-04-22 DIAGNOSIS — K219 Gastro-esophageal reflux disease without esophagitis: Secondary | ICD-10-CM | POA: Diagnosis not present

## 2023-04-22 DIAGNOSIS — R6521 Severe sepsis with septic shock: Secondary | ICD-10-CM | POA: Diagnosis not present

## 2023-04-22 DIAGNOSIS — N186 End stage renal disease: Secondary | ICD-10-CM | POA: Diagnosis not present

## 2023-04-22 DIAGNOSIS — I4891 Unspecified atrial fibrillation: Secondary | ICD-10-CM | POA: Diagnosis not present

## 2023-04-22 DIAGNOSIS — I120 Hypertensive chronic kidney disease with stage 5 chronic kidney disease or end stage renal disease: Secondary | ICD-10-CM | POA: Diagnosis not present

## 2023-04-22 NOTE — Telephone Encounter (Signed)
 Have left patient several messages re: scheduling of L arm fistulogram with Dr. Randie Heinz.  No returned calls.   Request shredded.

## 2023-04-22 NOTE — Patient Outreach (Signed)
  Care Management   Follow Up Note   04/22/2023 Name: Kim Burns MRN: [id number] DOB: [birth year]   Referred by: Lewis Moccasin, MD Reason for referral : Care Management (RNCM:Attempted Follow Up For Chronic Disease Management & Care Coordination Needs )   An unsuccessful telephone outreach was attempted today. The patient was referred to the case management team for assistance with care management and care coordination.   Follow Up Plan: The care management team will reach out to the patient again over the next 30 days.   Larey Brick, BSN RN [facility], [facility] RN Care Manager Direct Dial: [phone]  Fax: [phone]

## 2023-04-23 DIAGNOSIS — I4891 Unspecified atrial fibrillation: Secondary | ICD-10-CM | POA: Diagnosis not present

## 2023-04-23 DIAGNOSIS — N2581 Secondary hyperparathyroidism of renal origin: Secondary | ICD-10-CM | POA: Diagnosis not present

## 2023-04-23 DIAGNOSIS — N186 End stage renal disease: Secondary | ICD-10-CM | POA: Diagnosis not present

## 2023-04-23 DIAGNOSIS — I120 Hypertensive chronic kidney disease with stage 5 chronic kidney disease or end stage renal disease: Secondary | ICD-10-CM | POA: Diagnosis not present

## 2023-04-23 DIAGNOSIS — K219 Gastro-esophageal reflux disease without esophagitis: Secondary | ICD-10-CM | POA: Diagnosis not present

## 2023-04-23 DIAGNOSIS — Z992 Dependence on renal dialysis: Secondary | ICD-10-CM | POA: Diagnosis not present

## 2023-04-23 DIAGNOSIS — L299 Pruritus, unspecified: Secondary | ICD-10-CM | POA: Diagnosis not present

## 2023-04-23 DIAGNOSIS — R6521 Severe sepsis with septic shock: Secondary | ICD-10-CM | POA: Diagnosis not present

## 2023-04-24 ENCOUNTER — Encounter: Payer: Self-pay | Admitting: Orthopedic Surgery

## 2023-04-24 ENCOUNTER — Ambulatory Visit: Admitting: Orthopedic Surgery

## 2023-04-24 DIAGNOSIS — M869 Osteomyelitis, unspecified: Secondary | ICD-10-CM | POA: Diagnosis not present

## 2023-04-24 DIAGNOSIS — I739 Peripheral vascular disease, unspecified: Secondary | ICD-10-CM | POA: Diagnosis not present

## 2023-04-24 DIAGNOSIS — N186 End stage renal disease: Secondary | ICD-10-CM | POA: Diagnosis not present

## 2023-04-24 DIAGNOSIS — R6521 Severe sepsis with septic shock: Secondary | ICD-10-CM | POA: Diagnosis not present

## 2023-04-24 DIAGNOSIS — I4891 Unspecified atrial fibrillation: Secondary | ICD-10-CM | POA: Diagnosis not present

## 2023-04-24 DIAGNOSIS — K219 Gastro-esophageal reflux disease without esophagitis: Secondary | ICD-10-CM | POA: Diagnosis not present

## 2023-04-24 DIAGNOSIS — I10 Essential (primary) hypertension: Secondary | ICD-10-CM | POA: Diagnosis not present

## 2023-04-24 NOTE — Progress Notes (Signed)
 Office Visit Note   Patient: Kim Burns           Date of Birth: 24-May-1935           MRN: [id number] Visit Date: 04/24/2023              Requested by: Lewis Moccasin, MD [address] [place],  [place] [address] PCP: Lewis Moccasin, MD  Chief Complaint  Patient presents with   Other    Bilateral foot pain with left 2nd toe wound       HPI: Patient is an 88 year old woman who presents with chronic ulceration left foot second toe first webspace.  Patient has had an MRI scan that shows osteomyelitis.  Patient has type 2 diabetes with associated renal disease and is on dialysis.  Assessment & Plan: Visit Diagnoses:  1. PAD (peripheral artery disease) (HCC)   2. Osteomyelitis of second toe of left foot (HCC)     Plan: With patient's significant peripheral arterial disease she is not a candidate for any type of amputation surgery within the foot.  With her multiple medical problems she has an extreme high risk of the wound not healing.  Recommended continue dry dressing changes and follow-up as needed.  Follow-Up Instructions: No follow-ups on file.   Ortho Exam  Patient is alert, oriented, no adenopathy, well-dressed, normal affect, normal respiratory effort. Examination patient does not have a palpable dorsalis pedis or posterior tibial pulse with the Doppler patient has a dampened monophasic dorsalis pedis and posterior tibial pulse with a irregular rate and rhythm consistent with A-fib.  Patient has an open wound over the medial border of the left toe there is no sausage digit swelling no cellulitis no drainage.  MRI scan shows osteomyelitis of the toe.  Imaging: No results found. No images are attached to the encounter.  Labs: Lab Results  Component Value Date   HGBA1C 5.4 03/27/2023   HGBA1C 6.1 (H) 10/27/2022   HGBA1C 5.8 (H) 09/14/2022   ESRSEDRATE 3 04/11/2023   ESRSEDRATE 85 (H) 03/27/2023   ESRSEDRATE 33 (H) 03/05/2019   CRP <0.5 04/11/2023   CRP  6.2 (H) 03/27/2023   CRP 0.9 03/05/2019   LABURIC 6.1 03/28/2023   REPTSTATUS 04/17/2023 FINAL 04/11/2023   GRAMSTAIN  11/02/2021    ABUNDANT WBC PRESENT,BOTH PMN AND MONONUCLEAR NO ORGANISMS SEEN    CULT  04/11/2023    NO GROWTH 5 DAYS Performed at [facility], [address], [place], [place] [address]      Lab Results  Component Value Date   ALBUMIN 2.6 (L) 04/17/2023   ALBUMIN 2.7 (L) 04/16/2023   ALBUMIN 2.7 (L) 04/14/2023    Lab Results  Component Value Date   MG 2.5 (H) 04/15/2023   MG 2.4 04/11/2023   MG 2.3 04/10/2023   Lab Results  Component Value Date   VD25OH 32.15 11/13/2022    No results found for: "PREALBUMIN"    Latest Ref Rng & Units 04/17/2023    9:38 AM 04/16/2023   10:49 AM 04/14/2023    5:14 AM  CBC EXTENDED  WBC 4.0 - 10.5 K/uL 13.4  12.7  13.0   RBC 3.87 - 5.11 MIL/uL 4.53  4.51  3.77   Hemoglobin 12.0 - 15.0 g/dL 25.9  56.3  87.5   HCT 36.0 - 46.0 % 41.2  41.2  35.3   Platelets 150 - 400 K/uL 201  193  199   NEUT# 1.7 - 7.7 K/uL  12.2   Lymph# 0.7 - 4.0 K/uL   0.5      There is no height or weight on file to calculate BMI.  Orders:  No orders of the defined types were placed in this encounter.  No orders of the defined types were placed in this encounter.    Procedures: No procedures performed  Clinical Data: No additional findings.  ROS:  All other systems negative, except as noted in the HPI. Review of Systems  Objective: Vital Signs: There were no vitals taken for this visit.  Specialty Comments:  No specialty comments available.  PMFS History: Patient Active Problem List   Diagnosis Date Noted   Osteomyelitis of second toe of left foot (HCC) 04/12/2023   Acute osteomyelitis (HCC) 04/11/2023   Acute on chronic respiratory failure with hypoxia and hypercapnia (HCC) 04/11/2023   Generalized weakness 04/10/2023   Sterile pyuria 03/27/2023   Cellulitis of left foot 03/26/2023   Physical deconditioning  12/31/2022   Parkinson disease (HCC) 12/26/2022   Nephrostomy complication (HCC) 12/26/2022   Elevated LFTs 12/24/2022   Malnutrition of moderate degree 12/24/2022   Cerebrovascular disease 12/24/2022   OSA (obstructive sleep apnea) 12/24/2022   Hypoalbuminemia 12/24/2022   TBI (traumatic brain injury) (HCC) 10/31/2022   Lacunar infarct, acute (HCC) 10/27/2022   Acute systolic heart failure (HCC) 10/26/2022   Atrial fibrillation, rapid (HCC) 10/26/2022   Pressure injury of skin 10/26/2022   Traumatic brain injury (HCC) 10/21/2022   Contusion of right cerebral hemisphere (HCC) 10/21/2022   Closed skull fracture (HCC) 10/21/2022   Sacral fracture, closed (HCC) 10/21/2022   Exocrine pancreatic insufficiency 10/21/2022   Hypertension 10/21/2022   Hypomagnesemia 09/18/2022   RLS (restless legs syndrome) 09/18/2022   HSV (herpes simplex virus) dendritic keratitis 09/18/2022   Transaminitis 09/18/2022   Atrial fibrillation with RVR (HCC) 09/14/2022   Essential hypertension 09/14/2022   Erythropoietin (EPO) stimulating agent anemia management patient 07/16/2022   Hematoma of left kidney 01/22/2022   Kidney, perinephric abscess 11/02/2021   Bacteremia due to Streptococcus 11/01/2021   Lactic acidosis 10/30/2021   Fever 10/30/2021   Severe sepsis (HCC) 10/29/2021   Hydronephrosis of left kidney 10/29/2021   Chronic diastolic CHF (congestive heart failure) (HCC) 10/29/2021   Coronary artery disease involving native coronary artery of native heart without angina pectoris 10/29/2021   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) 10/29/2021   Elevated troponin    Chest pain of uncertain etiology    Pulmonary nodule 08/26/2021   Paroxysmal atrial fibrillation (HCC) 08/26/2021   ESRD (end stage renal disease) (HCC) 08/25/2021   Renal cell carcinoma of left kidney (HCC) 08/22/2021   DM2 (diabetes mellitus, type 2) (HCC) 11/14/2016   Hypokalemia 11/12/2016   Melena    History of  anemia due to chronic kidney disease    Acute renal failure (ARF) (HCC) 10/03/2016   GERD (gastroesophageal reflux disease) 04/29/2016   Hyperglycemia 04/29/2016   IPMN (intraductal papillary mucinous neoplasm) 04/23/2016   History of stroke 04/16/2016   Sepsis (HCC) 06/18/2013   Localized swelling, mass and lump, neck 03/29/2013   Sinus bradycardia 09/18/2012   Fatigue 09/18/2012   Past Medical History:  Diagnosis Date   Anemia of chronic disease    takes iron   Anxiety    Atrial fibrillation (HCC)    Blood transfusion without reported diagnosis    Bradycardia    Diabetes mellitus without complication (HCC)    became diabetic after Whipple procedure   Diarrhea    Dysrhythmia  04/16/2016   bradycardia due to medication    ESRD on hemodialysis [facility])    M-W-F   GERD (gastroesophageal reflux disease)    Gout    Headache    History of kidney stones 06/2013   Hyperparathyroidism (HCC)    Hypertension    PONV (postoperative nausea and vomiting)    Sleep apnea    no cpap machine. could not tolerate   Stroke (HCC) 04/16/2016   TIA 1995   Vitamin D deficiency     Family History  Problem Relation Age of Onset   Heart disease Mother    Stroke Mother    Cancer Father        colon   Alzheimer's disease Sister    Alzheimer's disease Sister    Cancer Brother        brain   Breast cancer Neg Hx     Past Surgical History:  Procedure Laterality Date   A/V FISTULAGRAM Left 08/30/2021   Procedure: A/V Fistulagram;  Surgeon: Cephus Shelling, MD;  Location: [facility] INVASIVE CV LAB;  Service: Cardiovascular;  Laterality: Left;   A/V FISTULAGRAM N/A 02/25/2023   Procedure: A/V Fistulagram;  Surgeon: Maeola Harman, MD;  Location: [facility] INVASIVE CV LAB;  Service: Vascular;  Laterality: N/A;   ABDOMINAL HYSTERECTOMY  1985   complete   BACK SURGERY  1980   lower   BASCILIC VEIN TRANSPOSITION Left 04/24/2017   Procedure: LEFT ARM FIRST STAGE BASILIC VEIN TRANSPOSITION;   Surgeon: Nada Libman, MD;  Location: [facility] OR;  Service: Vascular;  Laterality: Left;   BASCILIC VEIN TRANSPOSITION Left 07/10/2017   Procedure: SECOND STAGE BASILIC VEIN TRANSPOSITION LEFT ARM;  Surgeon: Nada Libman, MD;  Location: [facility] OR;  Service: Vascular;  Laterality: Left;   BREAST LUMPECTOMY Left x 2   many years apart, benign   CHOLECYSTECTOMY  1985   CYSTOSCOPY W/ URETERAL STENT PLACEMENT Left 10/31/2021   Procedure: CYSTOSCOPY WITH RETROGRADE PYELOGRAM/URETERAL STENT PLACEMENT;  Surgeon: Crist Fat, MD;  Location: [facility] OR;  Service: Urology;  Laterality: Left;   CYSTOSCOPY WITH URETEROSCOPY AND STENT PLACEMENT Left 06/18/2013   Procedure: CYSTOSCOPY WITH Lef URETEROSCOPY AND Left STENT PLACEMENT;  Surgeon: Crecencio Mc, MD;  Location: [facility] ORS;  Service: Urology;  Laterality: Left;   DIALYSIS/PERMA CATHETER INSERTION N/A 03/13/2023   Procedure: DIALYSIS/PERMA CATHETER INSERTION;  Surgeon: Dagoberto Ligas, MD;  Location[facility] INVASIVE CV LAB;  Service: Cardiovascular;  Laterality: N/A;   ESOPHAGOGASTRODUODENOSCOPY (EGD) WITH PROPOFOL N/A 11/13/2016   Procedure: ESOPHAGOGASTRODUODENOSCOPY (EGD) WITH PROPOFOL;  Surgeon: Kathi Der, MD;  Location: [facility];  Service: Gastroenterology;  Laterality: N/A;   EUS N/A 02/07/2016   Procedure: ESOPHAGEAL ENDOSCOPIC ULTRASOUND (EUS) RADIAL;  Surgeon: Willis Modena, MD;  Location: [facility];  Service: Endoscopy;  Laterality: N/A;   EYE SURGERY Bilateral 2014   ioc for catracts    FOOT SURGERY Left 1990   something with toes unsure what    HERNIA REPAIR  2018   IR CATHETER TUBE CHANGE  03/14/2022   IR EMBO TUMOR ORGAN ISCHEMIA INFARCT INC GUIDE ROADMAPPING  08/22/2021   IR NEPHROSTOMY EXCHANGE LEFT  06/27/2022   IR NEPHROSTOMY EXCHANGE LEFT  08/26/2022   IR NEPHROSTOMY EXCHANGE LEFT  10/22/2022   IR NEPHROSTOMY EXCHANGE LEFT  12/16/2022   IR NEPHROSTOMY EXCHANGE LEFT  12/30/2022   IR NEPHROSTOMY EXCHANGE LEFT  02/10/2023    IR NEPHROSTOMY EXCHANGE LEFT  04/07/2023   IR NEPHROSTOMY PLACEMENT LEFT  03/14/2022  IR RADIOLOGIST EVAL & MGMT  05/18/2021   IR RADIOLOGIST EVAL & MGMT  10/01/2021   IR RADIOLOGIST EVAL & MGMT  11/12/2021   IR RADIOLOGIST EVAL & MGMT  11/22/2021   IR RADIOLOGIST EVAL & MGMT  11/27/2021   IR RADIOLOGIST EVAL & MGMT  12/20/2021   IR RENAL SUPRASEL UNI S&I MOD SED  08/22/2021   IR US GUIDE VASC ACCESS RIGHT  08/22/2021   LEFT HEART CATH AND CORONARY ANGIOGRAPHY N/A 08/27/2021   Procedure: LEFT HEART CATH AND CORONARY ANGIOGRAPHY;  Surgeon: Kathleene Hazel, MD;  Location: [facility] CV [facility];  Service: Cardiovascular;  Laterality: N/A;   PARATHYROID EXPLORATION     PERIPHERAL VASCULAR BALLOON ANGIOPLASTY Left 08/30/2021   Procedure: PERIPHERAL VASCULAR BALLOON ANGIOPLASTY;  Surgeon: Cephus Shelling, MD;  Location: [facility];  Service: Cardiovascular;  Laterality: Left;  arm fistula   PERIPHERAL VASCULAR BALLOON ANGIOPLASTY Left 02/25/2023   Procedure: PERIPHERAL VASCULAR BALLOON ANGIOPLASTY;  Surgeon: Maeola Harman, MD;  Location: [facility] INVASIVE CV LAB;  Service: Vascular;  Laterality: Left;  left AVF   RADIOLOGY WITH ANESTHESIA Left 08/22/2021   Procedure: MICROWAVE ABLATION;  Surgeon: Simonne Come, MD;  Location: [facility] ORS;  Service: Anesthesiology;  Laterality: Left;   WHIPPLE PROCEDURE N/A 04/23/2016   Procedure: WHIPPLE PROCEDURE;  Surgeon: Almond Lint, MD;  Location: [facility] OR;  Service: General;  Laterality: N/A;   Social History   Occupational History   Not on file  Tobacco Use   Smoking status: Never   Smokeless tobacco: Never  Vaping Use   Vaping status: Never Used  Substance and Sexual Activity   Alcohol use: No   Drug use: No   Sexual activity: Not on file

## 2023-04-25 DIAGNOSIS — I4891 Unspecified atrial fibrillation: Secondary | ICD-10-CM | POA: Diagnosis not present

## 2023-04-25 DIAGNOSIS — L299 Pruritus, unspecified: Secondary | ICD-10-CM | POA: Diagnosis not present

## 2023-04-25 DIAGNOSIS — N2581 Secondary hyperparathyroidism of renal origin: Secondary | ICD-10-CM | POA: Diagnosis not present

## 2023-04-25 DIAGNOSIS — R6521 Severe sepsis with septic shock: Secondary | ICD-10-CM | POA: Diagnosis not present

## 2023-04-25 DIAGNOSIS — I10 Essential (primary) hypertension: Secondary | ICD-10-CM | POA: Diagnosis not present

## 2023-04-25 DIAGNOSIS — Z992 Dependence on renal dialysis: Secondary | ICD-10-CM | POA: Diagnosis not present

## 2023-04-25 DIAGNOSIS — N186 End stage renal disease: Secondary | ICD-10-CM | POA: Diagnosis not present

## 2023-04-25 DIAGNOSIS — K219 Gastro-esophageal reflux disease without esophagitis: Secondary | ICD-10-CM | POA: Diagnosis not present

## 2023-04-28 ENCOUNTER — Telehealth: Payer: Self-pay

## 2023-04-28 ENCOUNTER — Telehealth: Payer: Self-pay | Admitting: Cardiology

## 2023-04-28 DIAGNOSIS — N2581 Secondary hyperparathyroidism of renal origin: Secondary | ICD-10-CM | POA: Diagnosis not present

## 2023-04-28 DIAGNOSIS — I4891 Unspecified atrial fibrillation: Secondary | ICD-10-CM | POA: Diagnosis not present

## 2023-04-28 DIAGNOSIS — L299 Pruritus, unspecified: Secondary | ICD-10-CM | POA: Diagnosis not present

## 2023-04-28 DIAGNOSIS — K219 Gastro-esophageal reflux disease without esophagitis: Secondary | ICD-10-CM | POA: Diagnosis not present

## 2023-04-28 DIAGNOSIS — Z992 Dependence on renal dialysis: Secondary | ICD-10-CM | POA: Diagnosis not present

## 2023-04-28 DIAGNOSIS — I10 Essential (primary) hypertension: Secondary | ICD-10-CM | POA: Diagnosis not present

## 2023-04-28 DIAGNOSIS — R6521 Severe sepsis with septic shock: Secondary | ICD-10-CM | POA: Diagnosis not present

## 2023-04-28 DIAGNOSIS — N186 End stage renal disease: Secondary | ICD-10-CM | POA: Diagnosis not present

## 2023-04-28 NOTE — Telephone Encounter (Signed)
 Spoke with pt son, the patient has a post hospital appt with the NP 3/14 and then an appt with dr [place] 4/1. He is asking if he would need to keep both appointments. He reports the patient is currently in a facility and seems to be doing okay. He does report a decline as far as confusion  and less mobility, she can not walk by herself, since being discharged. He reports there were a lot of medication changes but we are aware of those. She was getting oxygen in the hospital but has not gotten it in the facility. He is aware it is usual for the patient to see NP after being discharged. Aware will forward to dr [place] to review hospital documentation and give his best judgement on appointments.

## 2023-04-28 NOTE — Telephone Encounter (Signed)
 Patient's son is calling because the patient has been in and out of the hospital recently. Patient has a hospital follow up scheduled for May 02, 2023 and would like to know if the patient should keep the appointment or wait to see Dr. [place]and on May 20, 2023. Please advise.

## 2023-04-28 NOTE — Telephone Encounter (Signed)
Spoke with pt son, aware of dr Elvis Coil recommendations.

## 2023-04-28 NOTE — Telephone Encounter (Signed)
 Spoke to [place], patient's son and POA.  He states that his mom has been in and out of hospital care for the past couple of months.  She is currently at [facility] facility but he is not sure that she will be returning home.  [facility] is currently using a "heart cath" per son, for dialysis treatment but they are asking for a fistulogram.  Per [name], a fistulogram was just performed in January and he is not sure if he wants to expose his mom to any extra treatments at this time.  We agreed that he will call in April 2025 and see if anything has changed as far as needs.

## 2023-04-29 DIAGNOSIS — L97524 Non-pressure chronic ulcer of other part of left foot with necrosis of bone: Secondary | ICD-10-CM | POA: Diagnosis not present

## 2023-04-29 DIAGNOSIS — L03116 Cellulitis of left lower limb: Secondary | ICD-10-CM | POA: Diagnosis not present

## 2023-04-29 DIAGNOSIS — M86172 Other acute osteomyelitis, left ankle and foot: Secondary | ICD-10-CM | POA: Diagnosis not present

## 2023-04-29 DIAGNOSIS — E11621 Type 2 diabetes mellitus with foot ulcer: Secondary | ICD-10-CM | POA: Diagnosis not present

## 2023-04-30 DIAGNOSIS — I10 Essential (primary) hypertension: Secondary | ICD-10-CM | POA: Diagnosis not present

## 2023-04-30 DIAGNOSIS — N2581 Secondary hyperparathyroidism of renal origin: Secondary | ICD-10-CM | POA: Diagnosis not present

## 2023-04-30 DIAGNOSIS — L299 Pruritus, unspecified: Secondary | ICD-10-CM | POA: Diagnosis not present

## 2023-04-30 DIAGNOSIS — I4891 Unspecified atrial fibrillation: Secondary | ICD-10-CM | POA: Diagnosis not present

## 2023-04-30 DIAGNOSIS — N186 End stage renal disease: Secondary | ICD-10-CM | POA: Diagnosis not present

## 2023-04-30 DIAGNOSIS — K219 Gastro-esophageal reflux disease without esophagitis: Secondary | ICD-10-CM | POA: Diagnosis not present

## 2023-04-30 DIAGNOSIS — Z992 Dependence on renal dialysis: Secondary | ICD-10-CM | POA: Diagnosis not present

## 2023-04-30 DIAGNOSIS — R6521 Severe sepsis with septic shock: Secondary | ICD-10-CM | POA: Diagnosis not present

## 2023-04-30 NOTE — Progress Notes (Signed)
 Cardiology Office Note    Date:  05/02/2023  ID:  Kim Burns, DOB 03/09/1935, MRN [id number] PCP:  Lewis Moccasin, MD  Cardiologist:  Peter Swaziland, MD  Electrophysiologist:  None   Chief Complaint: Follow up HFpEF  History of Present Illness: .    AROUSH Burns is a 88 y.o. female with visit-pertinent history of nonobstructive CAD on cardiac catheterization in 08/2021, chronic HFpEF, persistent atrial fibrillation not on anticoagulation, hypertension complicated by orthostatic hypotension, type 2 diabetes mellitus, CVA in 10/2022, ESRD on hemodialysis Monday Wednesday Friday, chronic anemia, osteomyelitis of left second toe in 03/2023, Parkinson's disease, pancreatic cancer s/p Whipple procedure in 2018, renal cell carcinoma s/p left renal embolization and ablation with nephrostomy tube placement.  On chart review patient was admitted from 2/5 through 03/29/2023 at [facility] for cellulitis and acute osteomyelitis of left second toe which was treated with antibiotics.  She was then admitted from 04/02/2023 through 04/05/2023 at [facility] the [facility] for influenza A, treated with Tamiflu.  On 04/10/2023 she presented back to [facility], ED for continued cough, fatigue and decreased p.o. intake and ultimately admitted for sepsis felt to be secondary to left lower foot cellulitis/osteomyelitis as well as recent influenza infection with pneumonia.  Cardiology was consulted regarding elevated troponin and evaluation of atrial fibrillation.  Patient noted to have persistent atrial fibrillation, presented with generalized weakness and decreased p.o. intake following recent admission for cellulitis/osteomyelitis of left foot and influenza, ultimately felt to be septic.  She was noted be in atrial fibrillation with intermittent RVR, echocardiogram on 04/11/2023 indicated LVEF of 55 to 60%, no RWMA, diastolic parameters were indeterminate, intraventricular septum was flattened in systole and diastole  consistent with right ventricular pressure and volume overload.  Rates were reasonably well-controlled in the 90s to low 100s, started on IV amiodarone.  High sensitive troponin on admission was mildly elevated but flat 240>>223>>173.  Patient denied any chest pain.  Her BNP was elevated at 2195 then 2000 179.  Echo as above demonstrated volume overload, she was given significant IV resuscitation and required BiPAP and hemodialysis with improvement of symptoms.  Today she presents for follow up. She reports that she has been doing well. She notes she does get slightly short of breath with prolonged exertion, however she feels this is related to her deconditioning. Notes she has been fatigued, but is slowly improving.  She denies chest pain, lower extremity edema, orthopnea or PND.  She denies any palpitations or feeling as though her heart rate is increased.  She reports that she overall has been feeling very well.  ROS: .   Today she denies chest pain, lower extremity edema, fatigue, palpitations, melena, hematuria, hemoptysis, diaphoresis, weakness, presyncope, syncope, orthopnea, and PND.  All other systems are reviewed and otherwise negative. Studies Reviewed: Marland Kitchen    EKG:  EKG is ordered today, personally reviewed, demonstrating  EKG Interpretation Date/Time:  Friday May 02 2023 14:36:59 EDT Ventricular Rate:  84 PR Interval:    QRS Duration:  104 QT Interval:  418 QTC Calculation: 493 R Axis:   53  Text Interpretation: Atrial fibrillation Septal infarct (cited on or before 27-Mar-2023) ST & T wave abnormality, consider inferolateral ischemia When compared with ECG of 15-Apr-2023 00:58, Vent. rate has decreased BY  45 BPM Criteria for Inferior infarct are no longer Present Non-specific change in ST segment in Lateral leads Confirmed by Reather Littler [phone]) on 05/02/2023 3:13:25 PM   CV Studies:  Cardiac Studies &  Procedures    ______________________________________________________________________________________________ CARDIAC CATHETERIZATION  CARDIAC CATHETERIZATION 08/27/2021  Narrative   Prox RCA lesion is 20% stenosed.   Mid LM to Dist LM lesion is 20% stenosed.   Ost LAD lesion is 30% stenosed.   Mid Cx lesion is 20% stenosed.   Prox LAD lesion is 50% stenosed.   The left ventricular systolic function is normal.   LV end diastolic pressure is normal.   The left ventricular ejection fraction is 55-65% by visual estimate.   There is no mitral valve regurgitation.  Mild non-obstructive CAD Normal LV systolic function Elevated troponin and chest pain is likely due to demand ischemic in the setting of rapid atrial fibrillation.  Recommendation: Medical management of CAD.  Findings Coronary Findings Diagnostic  Dominance: Right  Left Main Mid LM to Dist LM lesion is 20% stenosed.  Left Anterior Descending Vessel is large. Ost LAD lesion is 30% stenosed. Prox LAD lesion is 50% stenosed. The lesion is calcified.  Left Circumflex Mid Cx lesion is 20% stenosed.  Right Coronary Artery Vessel is large. Prox RCA lesion is 20% stenosed.  Intervention  No interventions have been documented.     ECHOCARDIOGRAM  ECHOCARDIOGRAM LIMITED 04/11/2023  Narrative ECHOCARDIOGRAM LIMITED REPORT    Patient Name:   Kim Burns Date of Exam: 04/11/2023 Medical Rec #:  [id number]        Height:       63.0 in Accession #:    [id number]       Weight:       112.4 lb Date of Birth:  1935-04-22        BSA:          1.514 m Patient Age:    87 years         BP:           98/58 mmHg Patient Gender: F                HR:           86 bpm. Exam Location:  Inpatient  Procedure: Limited Echo, Limited Color Doppler and Color Doppler (Both Spectral and Color Flow Doppler were utilized during procedure).  Indications:    CHF Sepsis  History:        Patient has prior history of Echocardiogram examinations,  most recent 01/30/2023. CHF, CAD, Arrythmias:Atrial Fibrillation; Risk Factors:Diabetes.  Sonographer:    Amy Chionchio Referring Phys: [id number] GRACE E BOWSER  IMPRESSIONS   1. Left ventricular ejection fraction, by estimation, is 55 to 60%. The left ventricle has normal function. The left ventricle has no regional wall motion abnormalities. Left ventricular diastolic parameters are indeterminate. There is the interventricular septum is flattened in systole and diastole, consistent with right ventricular pressure and volume overload. 2. Right ventricular systolic function is moderately reduced. The right ventricular size is mildly enlarged. There is severely elevated pulmonary artery systolic pressure. 3. Left atrial size was moderately dilated. 4. Right atrial size was mildly dilated. 5. The mitral valve is normal in structure. Mild mitral valve regurgitation. No evidence of mitral stenosis. Severe mitral annular calcification. 6. Tricuspid valve regurgitation is moderate. 7. The aortic valve is tricuspid. Aortic valve regurgitation is not visualized. Aortic valve sclerosis is present, with no evidence of aortic valve stenosis. 8. The inferior vena cava is dilated in size with <50% respiratory variability, suggesting right atrial pressure of 15 mmHg.  FINDINGS Left Ventricle: Left ventricular ejection fraction, by estimation, is 55 to 60%. The left  ventricle has normal function. The left ventricle has no regional wall motion abnormalities. Strain imaging was not performed. The left ventricular internal cavity size was normal in size. There is no left ventricular hypertrophy. The interventricular septum is flattened in systole and diastole, consistent with right ventricular pressure and volume overload. Left ventricular diastolic parameters are indeterminate.  Right Ventricle: The right ventricular size is mildly enlarged. Right ventricular systolic function is moderately reduced. There is  severely elevated pulmonary artery systolic pressure. The tricuspid regurgitant velocity is 3.44 m/s, and with an assumed right atrial pressure of 15 mmHg, the estimated right ventricular systolic pressure is 62.3 mmHg.  Left Atrium: Left atrial size was moderately dilated.  Right Atrium: Right atrial size was mildly dilated.  Pericardium: There is no evidence of pericardial effusion.  Mitral Valve: The mitral valve is normal in structure. Severe mitral annular calcification. Mild mitral valve regurgitation. No evidence of mitral valve stenosis. MV peak gradient, 5.5 mmHg. The mean mitral valve gradient is 1.0 mmHg.  Tricuspid Valve: The tricuspid valve is normal in structure. Tricuspid valve regurgitation is moderate . No evidence of tricuspid stenosis.  Aortic Valve: The aortic valve is tricuspid. Aortic valve regurgitation is not visualized. Aortic valve sclerosis is present, with no evidence of aortic valve stenosis.  Pulmonic Valve: The pulmonic valve was normal in structure. Pulmonic valve regurgitation is trivial. No evidence of pulmonic stenosis.  Aorta: The aortic root is normal in size and structure.  Venous: The inferior vena cava is dilated in size with less than 50% respiratory variability, suggesting right atrial pressure of 15 mmHg.  IAS/Shunts: No atrial level shunt detected by color flow Doppler.  Additional Comments: 3D imaging was not performed.  LEFT VENTRICLE PLAX 2D LVOT diam:     1.90 cm LV SV:         32 LV SV Index:   21 LVOT Area:     2.84 cm  LV Volumes (MOD) LV vol d, MOD A2C: 60.2 ml LV vol d, MOD A4C: 50.8 ml LV vol s, MOD A2C: 30.6 ml LV vol s, MOD A4C: 23.8 ml LV SV MOD A2C:     29.6 ml LV SV MOD A4C:     50.8 ml LV SV MOD BP:      28.9 ml  RIGHT VENTRICLE          IVC RV Basal diam:  3.40 cm  IVC diam: 2.40 cm RV Mid diam:    2.60 cm TAPSE (M-mode): 0.5 cm  RIGHT ATRIUM           Index RA Area:     18.80 cm RA Volume:   51.20 ml   33.82 ml/m AORTIC VALVE LVOT Vmax:   63.90 cm/s LVOT Vmean:  46.700 cm/s LVOT VTI:    0.113 m  MITRAL VALVE             TRICUSPID VALVE MV Area VTI:  1.32 cm   TR Peak grad:   47.3 mmHg MV Peak grad: 5.5 mmHg   TR Vmax:        344.00 cm/s MV Mean grad: 1.0 mmHg MV Vmax:      1.17 m/s   SHUNTS MV Vmean:     49.1 cm/s  Systemic VTI:  0.11 m Systemic Diam: 1.90 cm  Olga Millers MD Electronically signed by Olga Millers MD Signature Date/Time: 04/11/2023/12:22:46 PM    Final          ______________________________________________________________________________________________  Current Reported Medications:.    Current Meds  Medication Sig   acetaminophen (TYLENOL) 500 MG tablet Take 1,000 mg by mouth every 6 (six) hours as needed for mild pain (pain score 1-3) or headache.   amiodarone (PACERONE) 200 MG tablet Take 1 tablet (200 mg total) by mouth daily.   aspirin 81 MG chewable tablet Chew 1 tablet (81 mg total) by mouth daily. (Patient taking differently: Chew 81 mg by mouth daily. Take 81mg  by mouth every morning except on dialysis days, hold in the morning and take after dialysis.)   B Complex-C-Zn-Folic Acid (DIALYVITE 800 WITH ZINC) 0.8 MG TABS Take 1 tablet by mouth daily.   benzonatate (TESSALON) 200 MG capsule Take 200 mg by mouth 3 (three) times daily as needed.   dextromethorphan-guaiFENesin (MUCINEX DM) 30-600 MG 12hr tablet Take 1 tablet by mouth 2 (two) times daily as needed for cough.   lidocaine-prilocaine (EMLA) cream Apply 1 Application topically every Monday, Wednesday, and Friday with hemodialysis.   Lifitegrast 5 % SOLN Administer 1 drop into both eyes in the morning and 1 drop before bedtime.   lipase/protease/amylase (CREON) 36000 UNITS CPEP capsule Take 36,000 Units by mouth See admin instructions. Take 36,000 units (1 capsule) by mouth with meals and snacks.   meclizine (ANTIVERT) 25 MG tablet Take 1 tablet (25 mg total) by mouth 3 (three)  times daily as needed for dizziness.   metoprolol tartrate (LOPRESSOR) 25 MG tablet Take 1 tablet (25 mg total) by mouth 2 (two) times daily.   midodrine (PROAMATINE) 10 MG tablet Take 0.5 tablets (5 mg total) by mouth 3 (three) times daily. Take 10mg  by mouth before dialysis. Bring with you to dialysis in case another dose is required. May take 10 mg 3 times a day if systolic BP less than 120 mmHg   pantoprazole (PROTONIX) 40 MG tablet Take by mouth.   Polyethyl Glycol-Propyl Glycol (SYSTANE HYDRATION PF OP) Place 1 drop into both eyes 3 (three) times daily.   rOPINIRole (REQUIP) 0.25 MG tablet Take 3 tablets (0.75 mg total) by mouth at bedtime.   sevelamer carbonate (RENVELA) 800 MG tablet Take 1 tablet (800 mg total) by mouth See admin instructions. Take 800mg  by mouth with meals or snacks.   White Petrolatum-Mineral Oil (SYSTANE NIGHTTIME) OINT Apply 1 Application to eye at bedtime as needed (severe dry eyes).    Physical Exam:    VS:  BP 100/60   Pulse 84   Ht 5\' 3"  (1.6 m)   Wt 121 lb (54.9 kg)   SpO2 91%   BMI 21.43 kg/m    Wt Readings from Last 3 Encounters:  05/02/23 121 lb (54.9 kg)  04/19/23 112 lb 7 oz (51 kg)  03/28/23 112 lb 7 oz (51 kg)    GEN: Frail appearing, in no acute distress NECK: No JVD; No carotid bruits CARDIAC: Irregularly irregular RR, no murmurs, rubs, gallops RESPIRATORY:  Clear to auscultation without rales, wheezing or rhonchi  ABDOMEN: Soft, non-tender, non-distended EXTREMITIES:  No edema; No acute deformity     Asessement and Plan:.    Persistent atrial fibrillation: Patient with history of persistent atrial fibrillation, she has previously not been on anticoagulation due to significant bleeding in the past, patient aware of stroke risk.  EKG today indicates rate controlled atrial fibrillation, will continue amiodarone 200 mg daily and metoprolol for rate control. She denies any palpitations or feelings of increased heart rates. Continue aspirin 81  mg daily.  HFpEF: Patient's BNP elevated  during recent admission following significant IV resuscitation.  She required BiPAP and hemodialysis had improvement.  Today she appears euvolemic and well compensated, she notes some shortness of breath with prolonged exertion, feels this is more related to deconditioning.  Patient attended dialysis this morning.  Notes some increased fatigue.  Fluid status now managed with dialysis.  Nonobstructive CAD: LHC in 08/2021 showed nonobstructive CAD. Stable with no anginal symptoms. No indication for ischemic evaluation.  Continue aspirin 81 mg daily. Reviewed ED precautions.    Hypertension/orthostatic hypotension: Patient with history of hypertension complicated by orthostatic hypotension.  On midodrine 5 mg 3 times daily. Notes it has overall been well controlled, takes additional dose if her blood pressure is low following dialysis.   ESRD: On hemodialysis, managed per nephrology.     Disposition: F/u with Dr. Swaziland in June, per patient request.   Signed, Rip Harbour, NP

## 2023-05-01 ENCOUNTER — Encounter (HOSPITAL_COMMUNITY): Payer: Self-pay

## 2023-05-01 ENCOUNTER — Emergency Department (HOSPITAL_COMMUNITY)
Admission: EM | Admit: 2023-05-01 | Discharge: 2023-05-01 | Disposition: A | Discharge status: Home / Self Care | Attending: Emergency Medicine | Admitting: Emergency Medicine

## 2023-05-01 ENCOUNTER — Other Ambulatory Visit: Payer: Self-pay

## 2023-05-01 ENCOUNTER — Emergency Department (HOSPITAL_COMMUNITY)

## 2023-05-01 DIAGNOSIS — I4891 Unspecified atrial fibrillation: Secondary | ICD-10-CM | POA: Diagnosis not present

## 2023-05-01 DIAGNOSIS — N186 End stage renal disease: Secondary | ICD-10-CM | POA: Diagnosis not present

## 2023-05-01 DIAGNOSIS — Z436 Encounter for attention to other artificial openings of urinary tract: Secondary | ICD-10-CM | POA: Insufficient documentation

## 2023-05-01 DIAGNOSIS — R6521 Severe sepsis with septic shock: Secondary | ICD-10-CM | POA: Diagnosis not present

## 2023-05-01 DIAGNOSIS — K219 Gastro-esophageal reflux disease without esophagitis: Secondary | ICD-10-CM | POA: Diagnosis not present

## 2023-05-01 DIAGNOSIS — T83022A Displacement of nephrostomy catheter, initial encounter: Secondary | ICD-10-CM | POA: Diagnosis not present

## 2023-05-01 DIAGNOSIS — I10 Essential (primary) hypertension: Secondary | ICD-10-CM | POA: Diagnosis not present

## 2023-05-01 HISTORY — PX: IR NEPHROSTOMY PLACEMENT LEFT: IMG6063

## 2023-05-01 MED ORDER — IOHEXOL 300 MG/ML  SOLN
50.0000 mL | Freq: Once | INTRAMUSCULAR | Status: AC | PRN
  Administered 2023-05-01: 15 mL

## 2023-05-01 MED ORDER — LIDOCAINE HCL 1 % IJ SOLN
INTRAMUSCULAR | Status: AC
  Filled 2023-05-01: qty 20

## 2023-05-01 MED ORDER — LIDOCAINE HCL 1 % IJ SOLN
20.0000 mL | Freq: Once | INTRAMUSCULAR | Status: AC
  Administered 2023-05-01: 10 mL
  Filled 2023-05-01: qty 20

## 2023-05-01 NOTE — ED Provider Notes (Signed)
  EMERGENCY DEPARTMENT AT [facility] Provider Note   CSN: [id number] Arrival date & time: 05/01/23  1306     History  No chief complaint on file.   Kim Burns is a 88 y.o. female.  HPI Patient presents for replacement of her nephrostomy bag.  Has left-sided nephrostomy bag that reportedly came out today.  Patient cannot tell me exactly what happened.  She is without complaints.  Reportedly ate at 11 AM.   Past Medical History:  Diagnosis Date   Anemia of chronic disease    takes iron   Anxiety    Atrial fibrillation (HCC)    Blood transfusion without reported diagnosis    Bradycardia    Diabetes mellitus without complication (HCC)    became diabetic after Whipple procedure   Diarrhea    Dysrhythmia 04/16/2016   bradycardia due to medication    ESRD on hemodialysis [facility])    M-W-F   GERD (gastroesophageal reflux disease)    Gout    Headache    History of kidney stones 06/2013   Hyperparathyroidism (HCC)    Hypertension    PONV (postoperative nausea and vomiting)    Sleep apnea    no cpap machine. could not tolerate   Stroke (HCC) 04/16/2016   TIA 1995   Vitamin D deficiency     Home Medications Prior to Admission medications   Medication Sig Start Date End Date Taking? Authorizing Provider  acetaminophen (TYLENOL) 500 MG tablet Take 1,000 mg by mouth every 6 (six) hours as needed for mild pain (pain score 1-3) or headache. 01/02/23   [provider]  amiodarone (PACERONE) 200 MG tablet Take 1 tablet (200 mg total) by mouth daily. 11/15/22   Love, Evlyn Kanner, PA-C  aspirin 81 MG chewable tablet Chew 1 tablet (81 mg total) by mouth daily. Patient taking differently: Chew 81 mg by mouth daily. Take 81mg  by mouth every morning except on dialysis days, hold in the morning and take after dialysis. 11/13/22   Love, Evlyn Kanner, PA-C  B Complex-C-Zn-Folic Acid (DIALYVITE 800 WITH ZINC) 0.8 MG TABS Take 1 tablet by mouth daily.    [provider]  benzonatate (TESSALON) 200 MG capsule Take 200 mg by mouth 3 (three) times daily as needed. 04/08/23   [provider]  dextromethorphan-guaiFENesin (MUCINEX DM) 30-600 MG 12hr tablet Take 1 tablet by mouth 2 (two) times daily as needed for cough.    [provider]  lidocaine-prilocaine (EMLA) cream Apply 1 Application topically every Monday, Wednesday, and Friday with hemodialysis. Patient not taking: Reported on 04/11/2023    [provider]  Lifitegrast 5 % SOLN Administer 1 drop into both eyes in the morning and 1 drop before bedtime. 03/10/23   [provider]  lipase/protease/amylase (CREON) 36000 UNITS CPEP capsule Take 36,000 Units by mouth See admin instructions. Take 36,000 units (1 capsule) by mouth with meals and snacks.    [provider]  meclizine (ANTIVERT) 25 MG tablet Take 1 tablet (25 mg total) by mouth 3 (three) times daily as needed for dizziness. 01/02/23 01/02/24  Gillis Santa, MD  metoprolol tartrate (LOPRESSOR) 25 MG tablet Take 1 tablet (25 mg total) by mouth 2 (two) times daily. 04/18/23   Ghimire, Werner Lean, MD  midodrine (PROAMATINE) 10 MG tablet Take 0.5 tablets (5 mg total) by mouth 3 (three) times daily. Take 10mg  by mouth before dialysis. Bring with you to dialysis in case another dose is required. May take  10 mg 3 times a day if systolic BP less than 120 mmHg 04/18/23 04/17/24  Ghimire, Werner Lean, MD  pantoprazole (PROTONIX) 40 MG tablet Take by mouth. 04/05/23 05/05/23  [provider]  Polyethyl Glycol-Propyl Glycol (SYSTANE HYDRATION PF OP) Place 1 drop into both eyes 3 (three) times daily.    [provider]  rOPINIRole (REQUIP) 0.25 MG tablet Take 3 tablets (0.75 mg total) by mouth at bedtime. 04/18/23   Ghimire, Werner Lean, MD  sevelamer carbonate (RENVELA) 800 MG tablet Take 1 tablet (800 mg total) by mouth See admin instructions. Take 800mg  by mouth with meals or snacks. 04/18/23   Ghimire,  Werner Lean, MD  White Petrolatum-Mineral Oil (SYSTANE NIGHTTIME) OINT Apply 1 Application to eye at bedtime as needed (severe dry eyes).    [provider]      Allergies    Lopid [gemfibrozil], Lotemax [loteprednol etabonate], Statins, Norco [hydrocodone-acetaminophen], Other, Vibra-tab [doxycycline], Amlodipine, Codeine, Hydrocodone, Hydrocodone-acetaminophen, Lisinopril, and Verapamil    Review of Systems   Review of Systems  Physical Exam Updated Vital Signs BP (!) 150/91 (BP Location: Right Arm)   Pulse 77   Temp 97.6 F (36.4 C) (Oral)   Resp 18   SpO2 99%  Physical Exam Vitals reviewed.  Genitourinary:    Comments: Dressing at site of previous nephrostomy bag on the left flank.  No bag or tube present. Neurological:     Mental Status: She is alert. Mental status is at baseline.     ED Results / Procedures / Treatments   Labs (all labs ordered are listed, but only abnormal results are displayed) Labs Reviewed - No data to display  EKG None  Radiology No results found.  Procedures Procedures    Medications Ordered in ED Medications - No data to display  ED Course/ Medical Decision Making/ A&P                                 Medical Decision Making  Patient with accidental removal of left nephrostomy tube.  Does not appear to be on blood thinners.  Discussed with Dr. Loreta Ave from interventional radiology who will help replace.  Reviewed note from previous replacement.  Nephrostomy tube replaced and patient can be discharged.        Final Clinical Impression(s) / ED Diagnoses Final diagnoses:  None    Rx / DC Orders ED Discharge Orders     None         Benjiman Core, MD 05/01/23 1524

## 2023-05-01 NOTE — ED Triage Notes (Signed)
 Per EMS  Nephrostomy bag removed Happened today Accident Needs replacement

## 2023-05-01 NOTE — Discharge Instructions (Signed)
 The nephrostomy tube has been replaced

## 2023-05-01 NOTE — Procedures (Signed)
 Interventional Radiology Procedure Note  Procedure:  Rescue/replacement of displaced left PCN. New 51F PCN.    Complications: None  Recommendations:  - To gravity - Do not submerge - Routine PCN care   Signed,  Yvone Neu. Loreta Ave, DO

## 2023-05-02 ENCOUNTER — Ambulatory Visit: Attending: Cardiology | Admitting: Cardiology

## 2023-05-02 ENCOUNTER — Encounter: Payer: Self-pay | Admitting: Cardiology

## 2023-05-02 ENCOUNTER — Ambulatory Visit: Payer: Medicare PPO | Admitting: Cardiology

## 2023-05-02 VITALS — BP 100/60 | HR 84 | Ht 63.0 in | Wt 121.0 lb

## 2023-05-02 DIAGNOSIS — N2581 Secondary hyperparathyroidism of renal origin: Secondary | ICD-10-CM | POA: Diagnosis not present

## 2023-05-02 DIAGNOSIS — R6521 Severe sepsis with septic shock: Secondary | ICD-10-CM | POA: Diagnosis not present

## 2023-05-02 DIAGNOSIS — I10 Essential (primary) hypertension: Secondary | ICD-10-CM | POA: Diagnosis not present

## 2023-05-02 DIAGNOSIS — I2510 Atherosclerotic heart disease of native coronary artery without angina pectoris: Secondary | ICD-10-CM

## 2023-05-02 DIAGNOSIS — I5042 Chronic combined systolic (congestive) and diastolic (congestive) heart failure: Secondary | ICD-10-CM | POA: Diagnosis not present

## 2023-05-02 DIAGNOSIS — I951 Orthostatic hypotension: Secondary | ICD-10-CM

## 2023-05-02 DIAGNOSIS — N186 End stage renal disease: Secondary | ICD-10-CM

## 2023-05-02 DIAGNOSIS — Z992 Dependence on renal dialysis: Secondary | ICD-10-CM | POA: Diagnosis not present

## 2023-05-02 DIAGNOSIS — K219 Gastro-esophageal reflux disease without esophagitis: Secondary | ICD-10-CM | POA: Diagnosis not present

## 2023-05-02 DIAGNOSIS — I4819 Other persistent atrial fibrillation: Secondary | ICD-10-CM | POA: Diagnosis not present

## 2023-05-02 DIAGNOSIS — I4891 Unspecified atrial fibrillation: Secondary | ICD-10-CM | POA: Diagnosis not present

## 2023-05-02 DIAGNOSIS — L299 Pruritus, unspecified: Secondary | ICD-10-CM | POA: Diagnosis not present

## 2023-05-02 NOTE — Patient Instructions (Signed)
 Medication Instructions:  No changes *If you need a refill on your cardiac medications before your next appointment, please call your pharmacy*  Lab Work: No labs If you have labs (blood work) drawn today and your tests are completely normal, you will receive your results only by: MyChart Message (if you have MyChart) OR A paper copy in the mail If you have any lab test that is abnormal or we need to change your treatment, we will call you to review the results.  Testing/Procedures: No testing  Follow-Up: At [facility], you and your health needs are our priority.  As part of our continuing mission to provide you with exceptional heart care, we have created designated Provider Care Teams.  These Care Teams include your primary Cardiologist (physician) and Advanced Practice Providers (APPs -  Physician Assistants and Nurse Practitioners) who all work together to provide you with the care you need, when you need it.  We recommend signing up for the patient portal called "MyChart".  Sign up information is provided on this After Visit Summary.  MyChart is used to connect with patients for Virtual Visits (Telemedicine).  Patients are able to view lab/test results, encounter notes, upcoming appointments, etc.  Non-urgent messages can be sent to your provider as well.   To learn more about what you can do with MyChart, go to [email or web address].    Your next appointment:   2-3 month(s)  Provider:   Peter Swaziland, MD     Other Instructions

## 2023-05-03 ENCOUNTER — Encounter: Payer: Self-pay | Admitting: Cardiology

## 2023-05-05 DIAGNOSIS — I10 Essential (primary) hypertension: Secondary | ICD-10-CM | POA: Diagnosis not present

## 2023-05-05 DIAGNOSIS — N2581 Secondary hyperparathyroidism of renal origin: Secondary | ICD-10-CM | POA: Diagnosis not present

## 2023-05-05 DIAGNOSIS — I4891 Unspecified atrial fibrillation: Secondary | ICD-10-CM | POA: Diagnosis not present

## 2023-05-05 DIAGNOSIS — N186 End stage renal disease: Secondary | ICD-10-CM | POA: Diagnosis not present

## 2023-05-05 DIAGNOSIS — Z992 Dependence on renal dialysis: Secondary | ICD-10-CM | POA: Diagnosis not present

## 2023-05-05 DIAGNOSIS — R6521 Severe sepsis with septic shock: Secondary | ICD-10-CM | POA: Diagnosis not present

## 2023-05-05 DIAGNOSIS — K219 Gastro-esophageal reflux disease without esophagitis: Secondary | ICD-10-CM | POA: Diagnosis not present

## 2023-05-05 DIAGNOSIS — L299 Pruritus, unspecified: Secondary | ICD-10-CM | POA: Diagnosis not present

## 2023-05-06 DIAGNOSIS — N186 End stage renal disease: Secondary | ICD-10-CM | POA: Diagnosis not present

## 2023-05-06 DIAGNOSIS — R6521 Severe sepsis with septic shock: Secondary | ICD-10-CM | POA: Diagnosis not present

## 2023-05-06 DIAGNOSIS — I10 Essential (primary) hypertension: Secondary | ICD-10-CM | POA: Diagnosis not present

## 2023-05-06 DIAGNOSIS — I4891 Unspecified atrial fibrillation: Secondary | ICD-10-CM | POA: Diagnosis not present

## 2023-05-06 DIAGNOSIS — K219 Gastro-esophageal reflux disease without esophagitis: Secondary | ICD-10-CM | POA: Diagnosis not present

## 2023-05-07 DIAGNOSIS — I4891 Unspecified atrial fibrillation: Secondary | ICD-10-CM | POA: Diagnosis not present

## 2023-05-07 DIAGNOSIS — N186 End stage renal disease: Secondary | ICD-10-CM | POA: Diagnosis not present

## 2023-05-07 DIAGNOSIS — Z992 Dependence on renal dialysis: Secondary | ICD-10-CM | POA: Diagnosis not present

## 2023-05-07 DIAGNOSIS — I10 Essential (primary) hypertension: Secondary | ICD-10-CM | POA: Diagnosis not present

## 2023-05-07 DIAGNOSIS — N2581 Secondary hyperparathyroidism of renal origin: Secondary | ICD-10-CM | POA: Diagnosis not present

## 2023-05-07 DIAGNOSIS — L299 Pruritus, unspecified: Secondary | ICD-10-CM | POA: Diagnosis not present

## 2023-05-07 DIAGNOSIS — R6521 Severe sepsis with septic shock: Secondary | ICD-10-CM | POA: Diagnosis not present

## 2023-05-07 DIAGNOSIS — K219 Gastro-esophageal reflux disease without esophagitis: Secondary | ICD-10-CM | POA: Diagnosis not present

## 2023-05-08 DIAGNOSIS — E11621 Type 2 diabetes mellitus with foot ulcer: Secondary | ICD-10-CM | POA: Diagnosis not present

## 2023-05-08 DIAGNOSIS — S8012XA Contusion of left lower leg, initial encounter: Secondary | ICD-10-CM | POA: Diagnosis not present

## 2023-05-08 DIAGNOSIS — S81012D Laceration without foreign body, left knee, subsequent encounter: Secondary | ICD-10-CM | POA: Diagnosis not present

## 2023-05-08 DIAGNOSIS — L97524 Non-pressure chronic ulcer of other part of left foot with necrosis of bone: Secondary | ICD-10-CM | POA: Diagnosis not present

## 2023-05-08 DIAGNOSIS — S41011D Laceration without foreign body of right shoulder, subsequent encounter: Secondary | ICD-10-CM | POA: Diagnosis not present

## 2023-05-09 DIAGNOSIS — I4891 Unspecified atrial fibrillation: Secondary | ICD-10-CM | POA: Diagnosis not present

## 2023-05-09 DIAGNOSIS — K219 Gastro-esophageal reflux disease without esophagitis: Secondary | ICD-10-CM | POA: Diagnosis not present

## 2023-05-09 DIAGNOSIS — I10 Essential (primary) hypertension: Secondary | ICD-10-CM | POA: Diagnosis not present

## 2023-05-09 DIAGNOSIS — L299 Pruritus, unspecified: Secondary | ICD-10-CM | POA: Diagnosis not present

## 2023-05-09 DIAGNOSIS — Z992 Dependence on renal dialysis: Secondary | ICD-10-CM | POA: Diagnosis not present

## 2023-05-09 DIAGNOSIS — R6521 Severe sepsis with septic shock: Secondary | ICD-10-CM | POA: Diagnosis not present

## 2023-05-09 DIAGNOSIS — N186 End stage renal disease: Secondary | ICD-10-CM | POA: Diagnosis not present

## 2023-05-09 DIAGNOSIS — N2581 Secondary hyperparathyroidism of renal origin: Secondary | ICD-10-CM | POA: Diagnosis not present

## 2023-05-12 DIAGNOSIS — N186 End stage renal disease: Secondary | ICD-10-CM | POA: Diagnosis not present

## 2023-05-12 DIAGNOSIS — L299 Pruritus, unspecified: Secondary | ICD-10-CM | POA: Diagnosis not present

## 2023-05-12 DIAGNOSIS — Z992 Dependence on renal dialysis: Secondary | ICD-10-CM | POA: Diagnosis not present

## 2023-05-12 DIAGNOSIS — N2581 Secondary hyperparathyroidism of renal origin: Secondary | ICD-10-CM | POA: Diagnosis not present

## 2023-05-13 DIAGNOSIS — J9621 Acute and chronic respiratory failure with hypoxia: Secondary | ICD-10-CM | POA: Diagnosis not present

## 2023-05-13 DIAGNOSIS — E1122 Type 2 diabetes mellitus with diabetic chronic kidney disease: Secondary | ICD-10-CM | POA: Diagnosis not present

## 2023-05-13 DIAGNOSIS — I5032 Chronic diastolic (congestive) heart failure: Secondary | ICD-10-CM | POA: Diagnosis not present

## 2023-05-13 DIAGNOSIS — Z992 Dependence on renal dialysis: Secondary | ICD-10-CM | POA: Diagnosis not present

## 2023-05-13 DIAGNOSIS — I132 Hypertensive heart and chronic kidney disease with heart failure and with stage 5 chronic kidney disease, or end stage renal disease: Secondary | ICD-10-CM | POA: Diagnosis not present

## 2023-05-13 DIAGNOSIS — E1169 Type 2 diabetes mellitus with other specified complication: Secondary | ICD-10-CM | POA: Diagnosis not present

## 2023-05-13 DIAGNOSIS — N186 End stage renal disease: Secondary | ICD-10-CM | POA: Diagnosis not present

## 2023-05-13 DIAGNOSIS — J9622 Acute and chronic respiratory failure with hypercapnia: Secondary | ICD-10-CM | POA: Diagnosis not present

## 2023-05-13 DIAGNOSIS — M86172 Other acute osteomyelitis, left ankle and foot: Secondary | ICD-10-CM | POA: Diagnosis not present

## 2023-05-14 ENCOUNTER — Other Ambulatory Visit: Payer: Self-pay

## 2023-05-14 DIAGNOSIS — N2581 Secondary hyperparathyroidism of renal origin: Secondary | ICD-10-CM | POA: Diagnosis not present

## 2023-05-14 DIAGNOSIS — N186 End stage renal disease: Secondary | ICD-10-CM

## 2023-05-14 DIAGNOSIS — L299 Pruritus, unspecified: Secondary | ICD-10-CM | POA: Diagnosis not present

## 2023-05-14 DIAGNOSIS — Z992 Dependence on renal dialysis: Secondary | ICD-10-CM | POA: Diagnosis not present

## 2023-05-14 MED ORDER — SODIUM CHLORIDE 0.9% FLUSH: 3.0000 mL | INTRAVENOUS | Status: DC | PRN

## 2023-05-16 ENCOUNTER — Telehealth: Payer: Self-pay | Admitting: *Deleted

## 2023-05-16 DIAGNOSIS — N186 End stage renal disease: Secondary | ICD-10-CM | POA: Diagnosis not present

## 2023-05-16 DIAGNOSIS — Z992 Dependence on renal dialysis: Secondary | ICD-10-CM | POA: Diagnosis not present

## 2023-05-16 DIAGNOSIS — L299 Pruritus, unspecified: Secondary | ICD-10-CM | POA: Diagnosis not present

## 2023-05-16 DIAGNOSIS — N2581 Secondary hyperparathyroidism of renal origin: Secondary | ICD-10-CM | POA: Diagnosis not present

## 2023-05-16 NOTE — Progress Notes (Signed)
 Complex Care Management Care Guide Note  05/16/2023 Name: Kim Burns MRN: [id number] DOB: 02-08-1936  Kim Burns is a 88 y.o. year old female who is a primary care patient of Lewis Moccasin, MD and is actively engaged with the care management team. I reached out to Kim Burns by phone today to assist with re-scheduling  with the RN Case Manager.  Follow up plan: Successful telephone outreach attempt made.Patient request for Care Guide to call son [name]. An Unsuccessful outreach made to son unable to leave message.   Gwenevere Ghazi  [facility]  [facility], [facility] Guide  Direct Dial: [phone]  Fax [phone]

## 2023-05-19 DIAGNOSIS — Z992 Dependence on renal dialysis: Secondary | ICD-10-CM | POA: Diagnosis not present

## 2023-05-19 DIAGNOSIS — N2581 Secondary hyperparathyroidism of renal origin: Secondary | ICD-10-CM | POA: Diagnosis not present

## 2023-05-19 DIAGNOSIS — N186 End stage renal disease: Secondary | ICD-10-CM | POA: Diagnosis not present

## 2023-05-19 DIAGNOSIS — I158 Other secondary hypertension: Secondary | ICD-10-CM | POA: Diagnosis not present

## 2023-05-19 DIAGNOSIS — L299 Pruritus, unspecified: Secondary | ICD-10-CM | POA: Diagnosis not present

## 2023-05-20 ENCOUNTER — Ambulatory Visit: Payer: Medicare PPO | Admitting: Cardiology

## 2023-05-20 DIAGNOSIS — Z0000 Encounter for general adult medical examination without abnormal findings: Secondary | ICD-10-CM | POA: Diagnosis not present

## 2023-05-20 DIAGNOSIS — Z13220 Encounter for screening for lipoid disorders: Secondary | ICD-10-CM | POA: Diagnosis not present

## 2023-05-21 DIAGNOSIS — Z992 Dependence on renal dialysis: Secondary | ICD-10-CM | POA: Diagnosis not present

## 2023-05-21 DIAGNOSIS — N186 End stage renal disease: Secondary | ICD-10-CM | POA: Diagnosis not present

## 2023-05-21 DIAGNOSIS — N2581 Secondary hyperparathyroidism of renal origin: Secondary | ICD-10-CM | POA: Diagnosis not present

## 2023-05-22 DIAGNOSIS — I5032 Chronic diastolic (congestive) heart failure: Secondary | ICD-10-CM | POA: Diagnosis not present

## 2023-05-22 DIAGNOSIS — N186 End stage renal disease: Secondary | ICD-10-CM | POA: Diagnosis not present

## 2023-05-22 DIAGNOSIS — M86172 Other acute osteomyelitis, left ankle and foot: Secondary | ICD-10-CM | POA: Diagnosis not present

## 2023-05-22 DIAGNOSIS — E1122 Type 2 diabetes mellitus with diabetic chronic kidney disease: Secondary | ICD-10-CM | POA: Diagnosis not present

## 2023-05-22 DIAGNOSIS — J9621 Acute and chronic respiratory failure with hypoxia: Secondary | ICD-10-CM | POA: Diagnosis not present

## 2023-05-22 DIAGNOSIS — Z0000 Encounter for general adult medical examination without abnormal findings: Secondary | ICD-10-CM | POA: Diagnosis not present

## 2023-05-22 DIAGNOSIS — E1169 Type 2 diabetes mellitus with other specified complication: Secondary | ICD-10-CM | POA: Diagnosis not present

## 2023-05-22 DIAGNOSIS — Z992 Dependence on renal dialysis: Secondary | ICD-10-CM | POA: Diagnosis not present

## 2023-05-22 DIAGNOSIS — J9622 Acute and chronic respiratory failure with hypercapnia: Secondary | ICD-10-CM | POA: Diagnosis not present

## 2023-05-22 DIAGNOSIS — Z515 Encounter for palliative care: Secondary | ICD-10-CM | POA: Diagnosis not present

## 2023-05-22 DIAGNOSIS — N183 Chronic kidney disease, stage 3 unspecified: Secondary | ICD-10-CM | POA: Diagnosis not present

## 2023-05-22 DIAGNOSIS — I10 Essential (primary) hypertension: Secondary | ICD-10-CM | POA: Diagnosis not present

## 2023-05-22 DIAGNOSIS — I132 Hypertensive heart and chronic kidney disease with heart failure and with stage 5 chronic kidney disease, or end stage renal disease: Secondary | ICD-10-CM | POA: Diagnosis not present

## 2023-05-23 DIAGNOSIS — Z992 Dependence on renal dialysis: Secondary | ICD-10-CM | POA: Diagnosis not present

## 2023-05-23 DIAGNOSIS — N186 End stage renal disease: Secondary | ICD-10-CM | POA: Diagnosis not present

## 2023-05-23 DIAGNOSIS — N2581 Secondary hyperparathyroidism of renal origin: Secondary | ICD-10-CM | POA: Diagnosis not present

## 2023-05-26 DIAGNOSIS — N2581 Secondary hyperparathyroidism of renal origin: Secondary | ICD-10-CM | POA: Diagnosis not present

## 2023-05-26 DIAGNOSIS — N186 End stage renal disease: Secondary | ICD-10-CM | POA: Diagnosis not present

## 2023-05-26 DIAGNOSIS — Z992 Dependence on renal dialysis: Secondary | ICD-10-CM | POA: Diagnosis not present

## 2023-05-27 ENCOUNTER — Encounter (HOSPITAL_COMMUNITY): Payer: Self-pay | Admitting: Surgery

## 2023-05-27 ENCOUNTER — Encounter (HOSPITAL_COMMUNITY)
Admission: RE | Disposition: A | Discharge status: Home / Self Care | Payer: Self-pay | Source: Home / Self Care | Attending: Surgery

## 2023-05-27 ENCOUNTER — Ambulatory Visit (HOSPITAL_COMMUNITY)
Admission: RE | Admit: 2023-05-27 | Discharge: 2023-05-27 | Disposition: A | Discharge status: Home / Self Care | Attending: Surgery | Admitting: Surgery

## 2023-05-27 ENCOUNTER — Other Ambulatory Visit: Payer: Self-pay

## 2023-05-27 DIAGNOSIS — Z992 Dependence on renal dialysis: Secondary | ICD-10-CM | POA: Insufficient documentation

## 2023-05-27 DIAGNOSIS — T82858A Stenosis of vascular prosthetic devices, implants and grafts, initial encounter: Secondary | ICD-10-CM | POA: Insufficient documentation

## 2023-05-27 DIAGNOSIS — T82510A Breakdown (mechanical) of surgically created arteriovenous fistula, initial encounter: Secondary | ICD-10-CM | POA: Insufficient documentation

## 2023-05-27 DIAGNOSIS — Y832 Surgical operation with anastomosis, bypass or graft as the cause of abnormal reaction of the patient, or of later complication, without mention of misadventure at the time of the procedure: Secondary | ICD-10-CM | POA: Insufficient documentation

## 2023-05-27 DIAGNOSIS — N186 End stage renal disease: Secondary | ICD-10-CM | POA: Diagnosis not present

## 2023-05-27 DIAGNOSIS — I871 Compression of vein: Secondary | ICD-10-CM | POA: Diagnosis not present

## 2023-05-27 HISTORY — PX: A/V FISTULAGRAM: CATH118298

## 2023-05-27 HISTORY — PX: A/V SHUNT INTERVENTION: CATH118220

## 2023-05-27 LAB — POCT I-STAT, CHEM 8
BUN: 31 mg/dL — ABNORMAL HIGH (ref 8–23)
Calcium, Ion: 0.94 mmol/L — ABNORMAL LOW (ref 1.15–1.40)
Chloride: 98 mmol/L (ref 98–111)
Creatinine, Ser: 2.9 mg/dL — ABNORMAL HIGH (ref 0.44–1.00)
Glucose, Bld: 91 mg/dL (ref 70–99)
HCT: 39 % (ref 36.0–46.0)
Hemoglobin: 13.3 g/dL (ref 12.0–15.0)
Potassium: 3.2 mmol/L — ABNORMAL LOW (ref 3.5–5.1)
Sodium: 136 mmol/L (ref 135–145)
TCO2: 29 mmol/L (ref 22–32)

## 2023-05-27 LAB — GLUCOSE, CAPILLARY
Glucose-Capillary: 61 mg/dL — ABNORMAL LOW (ref 70–99)
Glucose-Capillary: 109 mg/dL — ABNORMAL HIGH (ref 70–99)

## 2023-05-27 SURGERY — A/V FISTULAGRAM
Anesthesia: LOCAL

## 2023-05-27 MED ORDER — MIDAZOLAM HCL 2 MG/2ML IJ SOLN
INTRAMUSCULAR | Status: AC
Start: 2023-05-27 — End: ?
  Filled 2023-05-27: qty 2

## 2023-05-27 MED ORDER — MIDAZOLAM HCL 2 MG/2ML IJ SOLN
INTRAMUSCULAR | Status: DC | PRN
  Administered 2023-05-27: 1 mg via INTRAVENOUS

## 2023-05-27 MED ORDER — LIDOCAINE HCL (PF) 1 % IJ SOLN
INTRAMUSCULAR | Status: AC
  Filled 2023-05-27: qty 30

## 2023-05-27 MED ORDER — DEXTROSE 50 % IV SOLN
1.0000 | Freq: Once | INTRAVENOUS | Status: AC
  Administered 2023-05-27: 50 mL via INTRAVENOUS

## 2023-05-27 MED ORDER — NALOXONE HCL 0.4 MG/ML IJ SOLN
INTRAMUSCULAR | Status: DC | PRN
  Administered 2023-05-27: .4 mg via INTRAVENOUS

## 2023-05-27 MED ORDER — FLUMAZENIL 1 MG/10ML IV SOLN
INTRAVENOUS | Status: DC | PRN
  Administered 2023-05-27: .5 mg via INTRAVENOUS

## 2023-05-27 MED ORDER — IODIXANOL 320 MG/ML IV SOLN
INTRAVENOUS | Status: DC | PRN
  Administered 2023-05-27: 20 mL

## 2023-05-27 MED ORDER — HEPARIN (PORCINE) IN NACL 1000-0.9 UT/500ML-% IV SOLN
INTRAVENOUS | Status: DC | PRN
  Administered 2023-05-27: 500 mL

## 2023-05-27 MED ORDER — FLUMAZENIL 1 MG/10ML IV SOLN
INTRAVENOUS | Status: AC
  Filled 2023-05-27: qty 10

## 2023-05-27 MED ORDER — DEXTROSE 50 % IV SOLN
INTRAVENOUS | Status: AC
  Filled 2023-05-27: qty 50

## 2023-05-27 MED ORDER — FENTANYL CITRATE (PF) 100 MCG/2ML IJ SOLN
INTRAMUSCULAR | Status: DC | PRN
  Administered 2023-05-27: 25 ug via INTRAVENOUS

## 2023-05-27 MED ORDER — NALOXONE HCL 0.4 MG/ML IJ SOLN
INTRAMUSCULAR | Status: AC
  Filled 2023-05-27: qty 1

## 2023-05-27 MED ORDER — LIDOCAINE HCL (PF) 1 % IJ SOLN
INTRAMUSCULAR | Status: DC | PRN
  Administered 2023-05-27: 3 mL

## 2023-05-27 MED ORDER — FENTANYL CITRATE (PF) 100 MCG/2ML IJ SOLN
INTRAMUSCULAR | Status: AC
  Filled 2023-05-27: qty 2

## 2023-05-27 SURGICAL SUPPLY — 13 items
BALLN MUSTANG 10X60X75 (BALLOONS) ×1 IMPLANT
BALLN MUSTANG 8.0X40 75 (BALLOONS) ×1 IMPLANT
BALLOON MUSTANG 10X60X75 (BALLOONS) IMPLANT
BALLOON MUSTANG 8.0X40 75 (BALLOONS) IMPLANT
COVER DOME SNAP 22 D (MISCELLANEOUS) ×1 IMPLANT
GLIDEWIRE ADV .035X260CM (WIRE) IMPLANT
KIT ENCORE 26 ADVANTAGE (KITS) IMPLANT
KIT MICROPUNCTURE NIT STIFF (SHEATH) IMPLANT
SHEATH PINNACLE R/O II 7F 4CM (SHEATH) IMPLANT
SHEATH PROBE COVER 6X72 (BAG) ×1 IMPLANT
STOPCOCK MORSE 400PSI 3WAY (MISCELLANEOUS) ×1 IMPLANT
TRAY PV CATH (CUSTOM PROCEDURE TRAY) ×1 IMPLANT
TUBING CIL FLEX 10 FLL-RA (TUBING) ×1 IMPLANT

## 2023-05-27 NOTE — Op Note (Signed)
    Patient name: Kim Burns MRN: [id number] DOB: 1935/04/21 Sex: female  05/27/2023 Pre-operative Diagnosis: ESRD Post-operative diagnosis:  Same Surgeon:  Durene Cal Procedure Performed:  1.  Ultrasound-guided access, left arm fistula  2.  Fistulogram  3.  Venoplasty, left basilic vein  4.  Conscious sedation, 20 minutes  Indications: This is an 88 year old female with end-stage renal disease who is dialyzing through a catheter and has had issues with recurrent stenosis in her basilic vein fistula.  She is here for further ration.  Procedure:  The patient was identified in the holding area and taken to room 8.  The patient was then placed supine on the table and prepped and draped in the usual sterile fashion.  A time out was called.  Conscious sedation was administered with the use of IV fentanyl and Versed under continuous physician and nurse monitoring.  Heart rate, blood pressure, and oxygen saturations were continuously monitored.  Total sedation time was 28 minutes ultrasound was used to evaluate the fistula.  The vein was patent and compressible.  A digital ultrasound image was acquired.  The fistula was then accessed under ultrasound guidance using a micropuncture needle.  An 018 wire was then asvanced without resistance and a micropuncture sheath was placed.  Contrast injections were then performed through the sheath.  Findings: The central venous system is widely patent.  There is aneurysmal changes within the basilic vein fistula.  In the mid arm there is a 3 cm segment of high-grade greater than 80% stenosis.  The arteriovenous anastomosis is widely patent.   Intervention: After the above images were acquired the decision was made to proceed with intervention.  Over a Glidewire advantage a 7 [place] sheath was placed.  I then selected an 8 x 40 balloon and performed balloon venoplasty at nominal pressure.  Completion imaging showed improved but suboptimal result so I upsized to  a 10 mm balloon and repeated balloon venoplasty this time achieving a better result.  Sheath and wires were removed.  Monocryl was used to close the skin.  Impression:  #1  Successful balloon venoplasty of a mid arm fistula stenosis using a 10 mm balloon  #2  If the patient continues to have issues, I would recommend stenting versus surgical revision     V. Durene Cal, M.D., [facility] [place] Office: [phone] Pager:  [phone]

## 2023-05-27 NOTE — Progress Notes (Signed)
 Complex Care Management Care Guide Note  05/27/2023 Name: Kim Burns MRN: [id number] DOB: March 03, 1935  [name] is a 88 y.o. year old female who is a primary care patient of Lewis Moccasin, MD and is actively engaged with the care management team. I reached out to [name] by phone today to assist with re-scheduling  with the RN Case Manager.  Follow up plan: Unsuccessful telephone outreach attempt made. A HIPAA compliant phone message was left for the patient providing contact information and requesting a return call. No further outreach attempts will be made at this time. We have been unable to contact the patient to reschedule for complex care management services.   Gwenevere Ghazi  [facility]  [facility], [facility] Guide  Direct Dial: [phone]  Fax [phone]

## 2023-05-27 NOTE — H&P (Signed)
   Patient name: Kim Burns MRN: [id number] DOB: December 25, 1935 Sex: female  HISTORY OF PRESENT ILLNESS:   Kim Burns is a 88 y.o. female with left arm fistula who is having trouble with dialysis.  Her last intervention was is January  CURRENT MEDICATIONS:    No current facility-administered medications for this encounter.    REVIEW OF SYSTEMS:   [X]  denotes positive finding, [ ]  denotes negative finding Cardiac  Comments:  Chest pain or chest pressure:    Shortness of breath upon exertion:    Short of breath when lying flat:    Irregular heart rhythm:    Constitutional    Fever or chills:      PHYSICAL EXAM:   Vitals:   05/27/23 0711  BP: (!) 137/92  Pulse: 86  Resp: 16  Temp: (!) 96.1 F (35.6 C)  TempSrc: Axillary  SpO2: 93%  Weight: 53.5 kg  Height: 5\' 3"  (1.6 m)    GENERAL: The patient is a well-nourished female, in no acute distress. The vital signs are documented above. CARDIOVASCULAR: There is a regular rate and rhythm. PULMONARY: Non-labored respirations   STUDIES:      MEDICAL ISSUES:   Plan for fistulogram with intervention.  All questions answered  Charlena Cross, MD, FACS [facility] [phone] Pager [phone]

## 2023-05-27 NOTE — Progress Notes (Signed)
 Patient responds to voice no changes in assessment. VSS. CBG 109 @ 1150. Patient answers questions and follows commands appropriately. Will continue to monitor.Kim Burns

## 2023-05-27 NOTE — Progress Notes (Signed)
 Care of patient and hand-off received from [facility] Years, Charity fundraiser. Patient resting with eyes closed. Patient is slow to respond but answers and follows commands appropriately. No neuro deficits noted. Pupils are +2 and reactive. VSS. Left arm fistula is dry/intact. Patient states that she is a diet controlled diabetic. CBG obtained; 61. Results reported to Dr. Myra Gianotti. Order given for 50% Dextrose. Patient continues to respond and follow commands. Will continue to monitor per orders and protocol.Mamie Levers

## 2023-05-28 ENCOUNTER — Encounter: Payer: Self-pay | Admitting: *Deleted

## 2023-05-28 ENCOUNTER — Other Ambulatory Visit: Payer: Self-pay

## 2023-05-28 ENCOUNTER — Other Ambulatory Visit: Admitting: *Deleted

## 2023-05-28 ENCOUNTER — Telehealth: Payer: Self-pay | Admitting: *Deleted

## 2023-05-28 DIAGNOSIS — N2581 Secondary hyperparathyroidism of renal origin: Secondary | ICD-10-CM | POA: Diagnosis not present

## 2023-05-28 DIAGNOSIS — N186 End stage renal disease: Secondary | ICD-10-CM | POA: Diagnosis not present

## 2023-05-28 DIAGNOSIS — Z992 Dependence on renal dialysis: Secondary | ICD-10-CM | POA: Diagnosis not present

## 2023-05-28 DIAGNOSIS — I5032 Chronic diastolic (congestive) heart failure: Secondary | ICD-10-CM

## 2023-05-28 DIAGNOSIS — E109 Type 1 diabetes mellitus without complications: Secondary | ICD-10-CM | POA: Diagnosis not present

## 2023-05-28 DIAGNOSIS — E1122 Type 2 diabetes mellitus with diabetic chronic kidney disease: Secondary | ICD-10-CM

## 2023-05-28 DIAGNOSIS — I10 Essential (primary) hypertension: Secondary | ICD-10-CM

## 2023-05-28 NOTE — Progress Notes (Signed)
 Complex Care Management Note  Care Guide Note 05/28/2023 Name: BURNICE VASSEL MRN: [id number] DOB: August 23, 1935  [name] is a 88 y.o. year old female who sees Lewis Moccasin, MD for primary care. I reached out to [name] by phone today to offer complex care management services.  Ms. Sirianni was given information about Complex Care Management services today including:   The Complex Care Management services include support from the care team which includes your Nurse Care Manager, Clinical Social Worker, or Pharmacist.  The Complex Care Management team is here to help remove barriers to the health concerns and goals most important to you. Complex Care Management services are voluntary, and the patient may decline or stop services at any time by request to their care team member.   Complex Care Management Consent Status: Patient agreed to services and verbal consent obtained.   Follow up plan:  Telephone appointment with complex care management team member scheduled for:  05/28/23  Encounter Outcome:  Patient Scheduled  Gwenevere Ghazi  [facility]  [facility], [facility] Guide  Direct Dial: [phone]  Fax [phone]

## 2023-05-29 ENCOUNTER — Encounter: Payer: Self-pay | Admitting: Podiatry

## 2023-05-29 ENCOUNTER — Ambulatory Visit: Admitting: Podiatry

## 2023-05-29 DIAGNOSIS — M2042 Other hammer toe(s) (acquired), left foot: Secondary | ICD-10-CM | POA: Diagnosis not present

## 2023-05-29 DIAGNOSIS — L97521 Non-pressure chronic ulcer of other part of left foot limited to breakdown of skin: Secondary | ICD-10-CM

## 2023-05-29 DIAGNOSIS — M2041 Other hammer toe(s) (acquired), right foot: Secondary | ICD-10-CM

## 2023-05-30 DIAGNOSIS — Z992 Dependence on renal dialysis: Secondary | ICD-10-CM | POA: Diagnosis not present

## 2023-05-30 DIAGNOSIS — N186 End stage renal disease: Secondary | ICD-10-CM | POA: Diagnosis not present

## 2023-05-30 DIAGNOSIS — N2581 Secondary hyperparathyroidism of renal origin: Secondary | ICD-10-CM | POA: Diagnosis not present

## 2023-05-30 NOTE — Progress Notes (Signed)
 Subjective:   Patient ID: Kim Burns, female   DOB: 88 y.o.   MRN: [id number]   HPI Patient presents with caregiver with severe health issues with lesion digit to right and digit to left that are irritative and wearing surgical shoe left   ROS      Objective:  Physical Exam  Significant diminishment of vascular status but due to very poor health condition not good to be possible for revascularization.  Patient is noted to have a small necrotic area dorsal aspect digit to right with digital deformity and a lesion between the hallux and second toe left due to pressure with slight drainage local no proximal edema erythema drainage noted     Assessment:  Very difficult situation as this patient is in very poor health with circulatory disease and structural deformity     Plan:  H&P reviewed with her caregiver and her the difficulty of this condition and I did dispense a surgical shoe for the right foot to try to offload some of the weight off the joint surface.  I then recommended spacing the hallux and second digits left and I gave her a device for this to try to offload weight I recommended soaks warm Epsom salts and water and elevation as she can do.  Currently not infected do not see any other treatment currently do not think she would do well with amputation or any kind of surgical procedure

## 2023-06-02 ENCOUNTER — Other Ambulatory Visit (HOSPITAL_COMMUNITY): Payer: Medicare PPO

## 2023-06-02 DIAGNOSIS — Z992 Dependence on renal dialysis: Secondary | ICD-10-CM | POA: Diagnosis not present

## 2023-06-02 DIAGNOSIS — N186 End stage renal disease: Secondary | ICD-10-CM | POA: Diagnosis not present

## 2023-06-02 DIAGNOSIS — N2581 Secondary hyperparathyroidism of renal origin: Secondary | ICD-10-CM | POA: Diagnosis not present

## 2023-06-04 DIAGNOSIS — N186 End stage renal disease: Secondary | ICD-10-CM | POA: Diagnosis not present

## 2023-06-04 DIAGNOSIS — N2581 Secondary hyperparathyroidism of renal origin: Secondary | ICD-10-CM | POA: Diagnosis not present

## 2023-06-04 DIAGNOSIS — Z992 Dependence on renal dialysis: Secondary | ICD-10-CM | POA: Diagnosis not present

## 2023-06-06 DIAGNOSIS — N2581 Secondary hyperparathyroidism of renal origin: Secondary | ICD-10-CM | POA: Diagnosis not present

## 2023-06-06 DIAGNOSIS — N186 End stage renal disease: Secondary | ICD-10-CM | POA: Diagnosis not present

## 2023-06-06 DIAGNOSIS — Z992 Dependence on renal dialysis: Secondary | ICD-10-CM | POA: Diagnosis not present

## 2023-06-09 DIAGNOSIS — N186 End stage renal disease: Secondary | ICD-10-CM | POA: Diagnosis not present

## 2023-06-09 DIAGNOSIS — H04123 Dry eye syndrome of bilateral lacrimal glands: Secondary | ICD-10-CM | POA: Diagnosis not present

## 2023-06-09 DIAGNOSIS — Z992 Dependence on renal dialysis: Secondary | ICD-10-CM | POA: Diagnosis not present

## 2023-06-09 DIAGNOSIS — N2581 Secondary hyperparathyroidism of renal origin: Secondary | ICD-10-CM | POA: Diagnosis not present

## 2023-06-10 ENCOUNTER — Other Ambulatory Visit: Payer: Self-pay | Admitting: *Deleted

## 2023-06-10 ENCOUNTER — Encounter: Payer: Self-pay | Admitting: *Deleted

## 2023-06-10 ENCOUNTER — Other Ambulatory Visit: Payer: Self-pay

## 2023-06-10 NOTE — Patient Instructions (Signed)
 Visit Information  Thank you for taking time to visit with me today. Please don't hesitate to contact me if I can be of assistance to you before our next scheduled appointment.  Your next care management appointment is by telephone on 07/02/23 at 3:30  Please call the care guide team at [phone] if you need to cancel, schedule, or reschedule an appointment.   Please call the [place]  National Suicide Prevention Lifeline: (251)444-2671 or TTY: 442-273-3316 TTY (820)715-2126) to talk to a trained counselor call the [facility]: [phone] call 911 if you are experiencing a Mental Health or Behavioral Health Crisis or need someone to talk to.  Michele Ahle, RN, BSN [place]  [facility] RN Care Manager Direct Dial: [phone]  Fax: [phone]

## 2023-06-10 NOTE — Patient Outreach (Signed)
 Complex Care Management   Visit Note  06/10/2023  Name:  Kim Burns MRN: [id number] DOB: 07-09-1935  Situation: Referral received for Complex Care Management related to ESRD, Diabetes with Complications, Atrial Fibrillation, and malnutrition, skin breakdown.  I obtained verbal consent from POA.  Visit completed with son/POA, [name],  on the phone  Background:   Past Medical History:  Diagnosis Date   Anemia of chronic disease    takes iron   Anxiety    Atrial fibrillation (HCC)    Blood transfusion without reported diagnosis    Bradycardia    Diabetes mellitus without complication (HCC)    became diabetic after Whipple procedure   Diarrhea    Dysrhythmia 04/16/2016   bradycardia due to medication    ESRD on hemodialysis [facility])    M-W-F   GERD (gastroesophageal reflux disease)    Gout    Headache    History of kidney stones 06/2013   Hyperparathyroidism (HCC)    Hypertension    PONV (postoperative nausea and vomiting)    Sleep apnea    no cpap machine. could not tolerate   Stroke (HCC) 04/16/2016   TIA 1995   Vitamin D  deficiency     Assessment: Patient Reported Symptoms:  Cognitive Cognitive Status: Poor judgment in daily scenarios, Requires Assistance Decision Making, Confused or disoriented, Difficulties with attention and concentration, Struggling with memory recall Cognitive/Intellectual Conditions Management [RPT]: Other Other: has not been assessed for dementia   Health Maintenance Behaviors: Annual physical exam, Sleep adequate Healing Pattern: Slow Health Facilitated by: Rest  Neurological Neurological Review of Symptoms: Not assessed    HEENT HEENT Symptoms Reported: Not assessed      Cardiovascular Cardiovascular Symptoms Reported: No symptoms reported Does patient have uncontrolled Hypertension?: No Cardiovascular Conditions: Hypertension, Dysrhythmia Cardiovascular Management Strategies: Adequate rest, Medication therapy, Routine  screening Cardiovascular Self-Management Outcome: 3 (uncertain)  Respiratory Respiratory Symptoms Reported: Not assesed    Endocrine Patient reports the following symptoms related to hypoglycemia or hyperglycemia : No symptoms reported Is patient diabetic?: Yes Is patient checking blood sugars at home?: Yes Endocrine Conditions: Diabetes Endocrine Management Strategies: Adequate rest, Routine screening, Medication therapy Endocrine Self-Management Outcome: 4 (good)  Gastrointestinal Gastrointestinal Symptoms Reported: Other Other Gastrointestinal Symptoms: decreased appetite and decreased nutritional intake Gastrointestinal Management Strategies: Diet modification Gastrointestinal Self-Management Outcome: 2 (bad) Nutrition Risk Screen (CP): Reduced oral intake over the last month, Large or nonhealing wound, burn or pressure injury  Genitourinary Genitourinary Symptoms Reported: No symptoms reported    Integumentary Integumentary Symptoms Reported: Wound Additional Integumentary Details: buttocks and toes on both feet. Has been assessed by podiatry within the past month. Skin Conditions: Wound, Pressure injury Skin Management Strategies: Dressing changes, Routine screening Skin Comment: post surgical shoes on both feet, barrier cream application to buttocks  Musculoskeletal Musculoskelatal Symptoms Reviewed: Weakness, Difficulty walking Additional Musculoskeletal Details: using walker for ambulation. HHPT & HHOT only approved for 2 visits. Musculoskeletal Conditions: Unsteady gait, Mobility limited Musculoskeletal Management Strategies: Adequate rest, Medical device Musculoskeletal Self-Management Outcome: 2 (bad)   Patient at Risk for Falls Due to: History of fall(s), Impaired balance/gait, Impaired mobility, Mental status change Fall risk Follow up: Falls evaluation completed, Falls prevention discussed  Psychosocial Psychosocial Symptoms Reported: No symptoms reported     Quality  of Family Relationships: supportive, helpful, involved      01/08/2023    3:56 PM  Depression screen PHQ 2/9  Decreased Interest 1  Down, Depressed, Hopeless 0  PHQ - 2 Score 1  There were no vitals filed for this visit.  Medications Reviewed Today     Reviewed by Ethelene Herald, RN (Registered Nurse) on 06/10/23 at 1606  Med List Status: <None>   Medication Order Taking? Sig Documenting Provider Last Dose Status Informant  acetaminophen  (TYLENOL ) 500 MG tablet [id number] Yes Take 1,000 mg by mouth every 6 (six) hours as needed for mild pain (pain score 1-3) or headache. [provider] Taking Active Spouse/Significant Other  amiodarone  (PACERONE ) 200 MG tablet [id number] Yes Take 1 tablet (200 mg total) by mouth daily. Zelda Hickman, PA-C Taking Active Spouse/Significant Other  aspirin  81 MG chewable tablet [id number] Yes Chew 1 tablet (81 mg total) by mouth daily. Zelda Hickman, PA-C Taking Active Spouse/Significant Other  B Complex-C-Zn-Folic Acid  (DIALYVITE 800 WITH ZINC ) 0.8 MG TABS [id number] Yes Take 1 tablet by mouth in the morning. [provider] Taking Active Spouse/Significant Other  benzonatate  (TESSALON ) 200 MG capsule [id number] Yes Take 200 mg by mouth 3 (three) times daily as needed for cough. [provider] Taking Active Spouse/Significant Other           Med Note Gardenia Jump, Jensen Cheramie N   Wed May 28, 2023  3:37 PM) PRN  Carboxymethylcell-Glycerin  PF (REFRESH TEARS PF) 0.5-0.9 % SOLN [id number] Yes Place 1 drop into both eyes 3 (three) times daily as needed (dry/irritated eyes.). [provider] Taking Active Spouse/Significant Other  diphenhydramine -acetaminophen  (TYLENOL  PM) 25-500 MG TABS tablet [id number] Yes Take 2 tablets by mouth at bedtime. [provider] Taking Active Spouse/Significant Other  lidocaine -prilocaine  (EMLA ) cream [id number] No Apply 1 Application topically every Monday, Wednesday, and Friday with hemodialysis.   Patient not taking: Reported on 06/10/2023   [provider] Not Taking Active Spouse/Significant Other           Med Note (Jese Comella N   Tue Jun 10, 2023  4:05 PM) On hold due to using port for dialysis for now  Lifitegrast  5 % SOLN [id number] Yes Place 1 drop into both eyes in the morning and at bedtime. [provider] Taking Active Spouse/Significant Other  lipase/protease/amylase (CREON ) 36000 UNITS CPEP capsule [id number] Yes Take 36,000 Units by mouth 3 (three) times daily with meals. [provider] Taking Active Spouse/Significant Other           Med Note Baruch Likens   Tue Dec 24, 2022 10:00 AM)    meclizine  (ANTIVERT ) 25 MG tablet [id number] Yes Take 1 tablet (25 mg total) by mouth 3 (three) times daily as needed for dizziness. Althia Atlas, MD Taking Active Spouse/Significant Other           Med Note Gardenia Jump, Clover Dao   Wed May 28, 2023  3:39 PM) PRN  metoprolol  tartrate (LOPRESSOR ) 25 MG tablet [id number] Yes Take 1 tablet (25 mg total) by mouth 2 (two) times daily. Burton Casey, MD Taking Active Spouse/Significant Other  midodrine  (PROAMATINE ) 10 MG tablet [id number] Yes Take 0.5 tablets (5 mg total) by mouth 3 (three) times daily. Take 10mg  by mouth before dialysis. Bring with you to dialysis in case another dose is required. May take 10 mg 3 times a day if systolic BP less than 120 mmHg Ghimire, Estil Heman, MD Taking Active Spouse/Significant Other  pantoprazole  (PROTONIX ) 40 MG tablet [id number] Yes Take 40 mg by mouth daily as needed (indigestion/heartburn.). [provider] Taking Active Spouse/Significant Other  rOPINIRole  (REQUIP ) 0.25 MG tablet [id number] Yes Take 3 tablets (0.75 mg total) by mouth at bedtime. Ghimire,  Estil Heman, MD Taking Active Spouse/Significant Other  sevelamer  carbonate (RENVELA ) 800 MG tablet [id number] Yes Take 1 tablet (800 mg total) by mouth See admin instructions. Take 800mg  by mouth with meals or snacks.   Patient taking differently: Take 800 mg by mouth 3 (three) times daily with meals.   Burton Casey, MD Taking Active Spouse/Significant Other  sodium chloride  flush (NS) 0.9 % injection 3 mL [id number]   Margherita Shell, MD  Active   traMADol  (ULTRAM ) 50 MG tablet [id number] Yes Take 50 mg by mouth every 6 (six) hours as needed for moderate pain (pain score 4-6). [provider] Taking Active            Med Note Gardenia Jump, Clover Dao   Wed May 28, 2023  4:15 PM) PRN pain  White Petrolatum-Mineral Oil (SYSTANE NIGHTTIME) OINT [id number] Yes Apply 1 Application to eye at bedtime as needed (severe dry eyes). [provider] Taking Active Spouse/Significant Other  Med List Note (Soremekun, Olalekan O, CPhT 09/14/22 1431): Dialysis on M, W, F.             Recommendation:   PCP Follow-up Specialty provider follow-up cardiology  Follow Up Plan:   Telephone follow up appointment date/time:  07/02/23 at 3:30  Michele Ahle, RN, BSN [place]  [facility] RN Care Manager Direct Dial: [phone]  Fax: [phone]

## 2023-06-11 ENCOUNTER — Other Ambulatory Visit: Payer: Self-pay | Admitting: Radiology

## 2023-06-11 DIAGNOSIS — N186 End stage renal disease: Secondary | ICD-10-CM | POA: Diagnosis not present

## 2023-06-11 DIAGNOSIS — N2581 Secondary hyperparathyroidism of renal origin: Secondary | ICD-10-CM | POA: Diagnosis not present

## 2023-06-11 DIAGNOSIS — Z992 Dependence on renal dialysis: Secondary | ICD-10-CM | POA: Diagnosis not present

## 2023-06-12 ENCOUNTER — Other Ambulatory Visit (HOSPITAL_COMMUNITY): Payer: Self-pay | Admitting: Interventional Radiology

## 2023-06-12 ENCOUNTER — Ambulatory Visit (HOSPITAL_COMMUNITY)
Admission: RE | Admit: 2023-06-12 | Discharge: 2023-06-12 | Disposition: A | Discharge status: Home / Self Care | Source: Ambulatory Visit | Attending: Interventional Radiology | Admitting: Interventional Radiology

## 2023-06-12 DIAGNOSIS — N250 Renal osteodystrophy: Secondary | ICD-10-CM | POA: Diagnosis not present

## 2023-06-12 DIAGNOSIS — E8770 Fluid overload, unspecified: Secondary | ICD-10-CM | POA: Diagnosis present

## 2023-06-12 DIAGNOSIS — I132 Hypertensive heart and chronic kidney disease with heart failure and with stage 5 chronic kidney disease, or end stage renal disease: Secondary | ICD-10-CM | POA: Diagnosis present

## 2023-06-12 DIAGNOSIS — I5033 Acute on chronic diastolic (congestive) heart failure: Secondary | ICD-10-CM | POA: Diagnosis present

## 2023-06-12 DIAGNOSIS — E8779 Other fluid overload: Secondary | ICD-10-CM | POA: Diagnosis not present

## 2023-06-12 DIAGNOSIS — J189 Pneumonia, unspecified organism: Secondary | ICD-10-CM | POA: Diagnosis present

## 2023-06-12 DIAGNOSIS — I4819 Other persistent atrial fibrillation: Secondary | ICD-10-CM | POA: Diagnosis present

## 2023-06-12 DIAGNOSIS — D631 Anemia in chronic kidney disease: Secondary | ICD-10-CM | POA: Diagnosis present

## 2023-06-12 DIAGNOSIS — E43 Unspecified severe protein-calorie malnutrition: Secondary | ICD-10-CM | POA: Diagnosis present

## 2023-06-12 DIAGNOSIS — L89626 Pressure-induced deep tissue damage of left heel: Secondary | ICD-10-CM | POA: Diagnosis present

## 2023-06-12 DIAGNOSIS — I5021 Acute systolic (congestive) heart failure: Secondary | ICD-10-CM | POA: Diagnosis not present

## 2023-06-12 DIAGNOSIS — E46 Unspecified protein-calorie malnutrition: Secondary | ICD-10-CM | POA: Diagnosis not present

## 2023-06-12 DIAGNOSIS — R918 Other nonspecific abnormal finding of lung field: Secondary | ICD-10-CM | POA: Diagnosis not present

## 2023-06-12 DIAGNOSIS — N1339 Other hydronephrosis: Secondary | ICD-10-CM

## 2023-06-12 DIAGNOSIS — L0390 Cellulitis, unspecified: Secondary | ICD-10-CM | POA: Diagnosis not present

## 2023-06-12 DIAGNOSIS — C649 Malignant neoplasm of unspecified kidney, except renal pelvis: Secondary | ICD-10-CM | POA: Insufficient documentation

## 2023-06-12 DIAGNOSIS — Z436 Encounter for attention to other artificial openings of urinary tract: Secondary | ICD-10-CM | POA: Insufficient documentation

## 2023-06-12 DIAGNOSIS — J90 Pleural effusion, not elsewhere classified: Secondary | ICD-10-CM | POA: Diagnosis not present

## 2023-06-12 DIAGNOSIS — E876 Hypokalemia: Secondary | ICD-10-CM | POA: Diagnosis present

## 2023-06-12 DIAGNOSIS — N179 Acute kidney failure, unspecified: Secondary | ICD-10-CM | POA: Diagnosis not present

## 2023-06-12 DIAGNOSIS — I5032 Chronic diastolic (congestive) heart failure: Secondary | ICD-10-CM | POA: Diagnosis not present

## 2023-06-12 DIAGNOSIS — E162 Hypoglycemia, unspecified: Secondary | ICD-10-CM | POA: Diagnosis not present

## 2023-06-12 DIAGNOSIS — E1122 Type 2 diabetes mellitus with diabetic chronic kidney disease: Secondary | ICD-10-CM | POA: Diagnosis present

## 2023-06-12 DIAGNOSIS — L03114 Cellulitis of left upper limb: Secondary | ICD-10-CM | POA: Diagnosis present

## 2023-06-12 DIAGNOSIS — R0602 Shortness of breath: Secondary | ICD-10-CM | POA: Diagnosis not present

## 2023-06-12 DIAGNOSIS — N185 Chronic kidney disease, stage 5: Secondary | ICD-10-CM | POA: Diagnosis not present

## 2023-06-12 DIAGNOSIS — D72829 Elevated white blood cell count, unspecified: Secondary | ICD-10-CM | POA: Diagnosis not present

## 2023-06-12 DIAGNOSIS — Z992 Dependence on renal dialysis: Secondary | ICD-10-CM | POA: Diagnosis not present

## 2023-06-12 DIAGNOSIS — L03113 Cellulitis of right upper limb: Secondary | ICD-10-CM | POA: Diagnosis present

## 2023-06-12 DIAGNOSIS — I700 Atherosclerosis of aorta: Secondary | ICD-10-CM | POA: Diagnosis not present

## 2023-06-12 DIAGNOSIS — Y848 Other medical procedures as the cause of abnormal reaction of the patient, or of later complication, without mention of misadventure at the time of the procedure: Secondary | ICD-10-CM | POA: Diagnosis present

## 2023-06-12 DIAGNOSIS — L89153 Pressure ulcer of sacral region, stage 3: Secondary | ICD-10-CM | POA: Diagnosis present

## 2023-06-12 DIAGNOSIS — E440 Moderate protein-calorie malnutrition: Secondary | ICD-10-CM | POA: Diagnosis not present

## 2023-06-12 DIAGNOSIS — E11649 Type 2 diabetes mellitus with hypoglycemia without coma: Secondary | ICD-10-CM | POA: Diagnosis present

## 2023-06-12 DIAGNOSIS — Y738 Miscellaneous gastroenterology and urology devices associated with adverse incidents, not elsewhere classified: Secondary | ICD-10-CM | POA: Diagnosis present

## 2023-06-12 DIAGNOSIS — I959 Hypotension, unspecified: Secondary | ICD-10-CM | POA: Diagnosis not present

## 2023-06-12 DIAGNOSIS — R627 Adult failure to thrive: Secondary | ICD-10-CM | POA: Diagnosis present

## 2023-06-12 DIAGNOSIS — Z66 Do not resuscitate: Secondary | ICD-10-CM | POA: Diagnosis present

## 2023-06-12 DIAGNOSIS — R4182 Altered mental status, unspecified: Secondary | ICD-10-CM | POA: Diagnosis not present

## 2023-06-12 DIAGNOSIS — N2581 Secondary hyperparathyroidism of renal origin: Secondary | ICD-10-CM | POA: Diagnosis present

## 2023-06-12 DIAGNOSIS — R531 Weakness: Secondary | ICD-10-CM | POA: Diagnosis not present

## 2023-06-12 DIAGNOSIS — E11621 Type 2 diabetes mellitus with foot ulcer: Secondary | ICD-10-CM | POA: Diagnosis present

## 2023-06-12 DIAGNOSIS — Z515 Encounter for palliative care: Secondary | ICD-10-CM | POA: Diagnosis not present

## 2023-06-12 DIAGNOSIS — I120 Hypertensive chronic kidney disease with stage 5 chronic kidney disease or end stage renal disease: Secondary | ICD-10-CM | POA: Diagnosis not present

## 2023-06-12 DIAGNOSIS — R5381 Other malaise: Secondary | ICD-10-CM | POA: Diagnosis not present

## 2023-06-12 DIAGNOSIS — I480 Paroxysmal atrial fibrillation: Secondary | ICD-10-CM | POA: Diagnosis not present

## 2023-06-12 DIAGNOSIS — E8809 Other disorders of plasma-protein metabolism, not elsewhere classified: Secondary | ICD-10-CM | POA: Diagnosis present

## 2023-06-12 DIAGNOSIS — L97529 Non-pressure chronic ulcer of other part of left foot with unspecified severity: Secondary | ICD-10-CM | POA: Diagnosis present

## 2023-06-12 DIAGNOSIS — E1322 Other specified diabetes mellitus with diabetic chronic kidney disease: Secondary | ICD-10-CM | POA: Diagnosis not present

## 2023-06-12 DIAGNOSIS — N186 End stage renal disease: Secondary | ICD-10-CM | POA: Diagnosis present

## 2023-06-12 DIAGNOSIS — Z7189 Other specified counseling: Secondary | ICD-10-CM | POA: Diagnosis not present

## 2023-06-12 DIAGNOSIS — G9389 Other specified disorders of brain: Secondary | ICD-10-CM | POA: Diagnosis not present

## 2023-06-12 HISTORY — PX: IR NEPHROSTOMY EXCHANGE LEFT: IMG6069

## 2023-06-12 MED ORDER — LIDOCAINE HCL 1 % IJ SOLN
10.0000 mL | Freq: Once | INTRAMUSCULAR | Status: AC
  Administered 2023-06-12: 10 mL via INTRADERMAL

## 2023-06-12 MED ORDER — IOHEXOL 300 MG/ML  SOLN
50.0000 mL | Freq: Once | INTRAMUSCULAR | Status: AC | PRN
  Administered 2023-06-12: 10 mL

## 2023-06-12 MED ORDER — LIDOCAINE HCL 1 % IJ SOLN
INTRAMUSCULAR | Status: AC
  Filled 2023-06-12: qty 20

## 2023-06-12 NOTE — Procedures (Signed)
 Interventional Radiology Procedure Note  Procedure:   Image guided exchange of left PCN .  Complications: None  Recommendations:  - To gravity   - Routine care   Signed,  Marciano Settles. Mabel Savage, DO

## 2023-06-13 DIAGNOSIS — Z992 Dependence on renal dialysis: Secondary | ICD-10-CM | POA: Diagnosis not present

## 2023-06-13 DIAGNOSIS — N186 End stage renal disease: Secondary | ICD-10-CM | POA: Diagnosis not present

## 2023-06-13 DIAGNOSIS — N2581 Secondary hyperparathyroidism of renal origin: Secondary | ICD-10-CM | POA: Diagnosis not present

## 2023-06-13 DIAGNOSIS — J189 Pneumonia, unspecified organism: Secondary | ICD-10-CM | POA: Diagnosis not present

## 2023-06-13 DIAGNOSIS — R0602 Shortness of breath: Secondary | ICD-10-CM | POA: Diagnosis not present

## 2023-06-14 ENCOUNTER — Encounter (HOSPITAL_COMMUNITY): Payer: Self-pay | Admitting: *Deleted

## 2023-06-14 ENCOUNTER — Other Ambulatory Visit: Payer: Self-pay

## 2023-06-14 ENCOUNTER — Inpatient Hospital Stay (HOSPITAL_COMMUNITY)
Admission: RE | Admit: 2023-06-14 | Discharge: 2023-06-16 | DRG: 193 | Disposition: A | Discharge status: Hospice | Attending: Internal Medicine | Admitting: Internal Medicine

## 2023-06-14 ENCOUNTER — Emergency Department (HOSPITAL_COMMUNITY)

## 2023-06-14 DIAGNOSIS — E46 Unspecified protein-calorie malnutrition: Secondary | ICD-10-CM | POA: Diagnosis not present

## 2023-06-14 DIAGNOSIS — Z515 Encounter for palliative care: Secondary | ICD-10-CM

## 2023-06-14 DIAGNOSIS — L03114 Cellulitis of left upper limb: Secondary | ICD-10-CM | POA: Diagnosis present

## 2023-06-14 DIAGNOSIS — Z91018 Allergy to other foods: Secondary | ICD-10-CM

## 2023-06-14 DIAGNOSIS — Y848 Other medical procedures as the cause of abnormal reaction of the patient, or of later complication, without mention of misadventure at the time of the procedure: Secondary | ICD-10-CM | POA: Diagnosis present

## 2023-06-14 DIAGNOSIS — E8779 Other fluid overload: Secondary | ICD-10-CM | POA: Diagnosis not present

## 2023-06-14 DIAGNOSIS — Z8673 Personal history of transient ischemic attack (TIA), and cerebral infarction without residual deficits: Secondary | ICD-10-CM

## 2023-06-14 DIAGNOSIS — N250 Renal osteodystrophy: Secondary | ICD-10-CM | POA: Diagnosis not present

## 2023-06-14 DIAGNOSIS — Z79899 Other long term (current) drug therapy: Secondary | ICD-10-CM

## 2023-06-14 DIAGNOSIS — D631 Anemia in chronic kidney disease: Secondary | ICD-10-CM | POA: Diagnosis present

## 2023-06-14 DIAGNOSIS — D72829 Elevated white blood cell count, unspecified: Secondary | ICD-10-CM | POA: Diagnosis present

## 2023-06-14 DIAGNOSIS — E11649 Type 2 diabetes mellitus with hypoglycemia without coma: Secondary | ICD-10-CM | POA: Diagnosis present

## 2023-06-14 DIAGNOSIS — N186 End stage renal disease: Secondary | ICD-10-CM | POA: Diagnosis present

## 2023-06-14 DIAGNOSIS — R531 Weakness: Secondary | ICD-10-CM | POA: Diagnosis not present

## 2023-06-14 DIAGNOSIS — E162 Hypoglycemia, unspecified: Secondary | ICD-10-CM | POA: Diagnosis present

## 2023-06-14 DIAGNOSIS — L97529 Non-pressure chronic ulcer of other part of left foot with unspecified severity: Secondary | ICD-10-CM | POA: Diagnosis present

## 2023-06-14 DIAGNOSIS — S41111A Laceration without foreign body of right upper arm, initial encounter: Secondary | ICD-10-CM | POA: Diagnosis present

## 2023-06-14 DIAGNOSIS — L0390 Cellulitis, unspecified: Secondary | ICD-10-CM | POA: Diagnosis not present

## 2023-06-14 DIAGNOSIS — E1322 Other specified diabetes mellitus with diabetic chronic kidney disease: Secondary | ICD-10-CM | POA: Diagnosis not present

## 2023-06-14 DIAGNOSIS — Z7189 Other specified counseling: Secondary | ICD-10-CM | POA: Diagnosis not present

## 2023-06-14 DIAGNOSIS — Z9049 Acquired absence of other specified parts of digestive tract: Secondary | ICD-10-CM

## 2023-06-14 DIAGNOSIS — R5381 Other malaise: Secondary | ICD-10-CM | POA: Diagnosis not present

## 2023-06-14 DIAGNOSIS — E440 Moderate protein-calorie malnutrition: Secondary | ICD-10-CM | POA: Diagnosis not present

## 2023-06-14 DIAGNOSIS — Z66 Do not resuscitate: Secondary | ICD-10-CM | POA: Diagnosis present

## 2023-06-14 DIAGNOSIS — Z881 Allergy status to other antibiotic agents status: Secondary | ICD-10-CM

## 2023-06-14 DIAGNOSIS — L89153 Pressure ulcer of sacral region, stage 3: Secondary | ICD-10-CM | POA: Diagnosis present

## 2023-06-14 DIAGNOSIS — N2581 Secondary hyperparathyroidism of renal origin: Secondary | ICD-10-CM | POA: Diagnosis present

## 2023-06-14 DIAGNOSIS — R053 Chronic cough: Secondary | ICD-10-CM | POA: Diagnosis present

## 2023-06-14 DIAGNOSIS — R4182 Altered mental status, unspecified: Secondary | ICD-10-CM | POA: Diagnosis not present

## 2023-06-14 DIAGNOSIS — Z7982 Long term (current) use of aspirin: Secondary | ICD-10-CM

## 2023-06-14 DIAGNOSIS — R54 Age-related physical debility: Secondary | ICD-10-CM | POA: Diagnosis present

## 2023-06-14 DIAGNOSIS — S41112A Laceration without foreign body of left upper arm, initial encounter: Secondary | ICD-10-CM | POA: Diagnosis present

## 2023-06-14 DIAGNOSIS — Z808 Family history of malignant neoplasm of other organs or systems: Secondary | ICD-10-CM

## 2023-06-14 DIAGNOSIS — Z6820 Body mass index (BMI) 20.0-20.9, adult: Secondary | ICD-10-CM

## 2023-06-14 DIAGNOSIS — E1122 Type 2 diabetes mellitus with diabetic chronic kidney disease: Secondary | ICD-10-CM | POA: Diagnosis present

## 2023-06-14 DIAGNOSIS — I5033 Acute on chronic diastolic (congestive) heart failure: Secondary | ICD-10-CM | POA: Diagnosis present

## 2023-06-14 DIAGNOSIS — I480 Paroxysmal atrial fibrillation: Secondary | ICD-10-CM | POA: Diagnosis present

## 2023-06-14 DIAGNOSIS — Z823 Family history of stroke: Secondary | ICD-10-CM

## 2023-06-14 DIAGNOSIS — G9389 Other specified disorders of brain: Secondary | ICD-10-CM | POA: Diagnosis not present

## 2023-06-14 DIAGNOSIS — I4819 Other persistent atrial fibrillation: Secondary | ICD-10-CM | POA: Diagnosis present

## 2023-06-14 DIAGNOSIS — L03113 Cellulitis of right upper limb: Secondary | ICD-10-CM | POA: Diagnosis present

## 2023-06-14 DIAGNOSIS — G4730 Sleep apnea, unspecified: Secondary | ICD-10-CM | POA: Diagnosis present

## 2023-06-14 DIAGNOSIS — L89626 Pressure-induced deep tissue damage of left heel: Secondary | ICD-10-CM | POA: Diagnosis present

## 2023-06-14 DIAGNOSIS — M898X9 Other specified disorders of bone, unspecified site: Secondary | ICD-10-CM | POA: Diagnosis present

## 2023-06-14 DIAGNOSIS — I959 Hypotension, unspecified: Secondary | ICD-10-CM | POA: Diagnosis present

## 2023-06-14 DIAGNOSIS — Z436 Encounter for attention to other artificial openings of urinary tract: Secondary | ICD-10-CM | POA: Diagnosis not present

## 2023-06-14 DIAGNOSIS — Z885 Allergy status to narcotic agent status: Secondary | ICD-10-CM

## 2023-06-14 DIAGNOSIS — R627 Adult failure to thrive: Secondary | ICD-10-CM | POA: Diagnosis present

## 2023-06-14 DIAGNOSIS — E8770 Fluid overload, unspecified: Secondary | ICD-10-CM | POA: Diagnosis present

## 2023-06-14 DIAGNOSIS — E8809 Other disorders of plasma-protein metabolism, not elsewhere classified: Secondary | ICD-10-CM | POA: Diagnosis present

## 2023-06-14 DIAGNOSIS — E876 Hypokalemia: Secondary | ICD-10-CM | POA: Diagnosis present

## 2023-06-14 DIAGNOSIS — Z90411 Acquired partial absence of pancreas: Secondary | ICD-10-CM

## 2023-06-14 DIAGNOSIS — Y738 Miscellaneous gastroenterology and urology devices associated with adverse incidents, not elsewhere classified: Secondary | ICD-10-CM | POA: Diagnosis present

## 2023-06-14 DIAGNOSIS — I5021 Acute systolic (congestive) heart failure: Secondary | ICD-10-CM | POA: Diagnosis not present

## 2023-06-14 DIAGNOSIS — I9589 Other hypotension: Secondary | ICD-10-CM | POA: Diagnosis present

## 2023-06-14 DIAGNOSIS — Z604 Social exclusion and rejection: Secondary | ICD-10-CM | POA: Diagnosis present

## 2023-06-14 DIAGNOSIS — Z992 Dependence on renal dialysis: Secondary | ICD-10-CM | POA: Diagnosis not present

## 2023-06-14 DIAGNOSIS — J90 Pleural effusion, not elsewhere classified: Secondary | ICD-10-CM | POA: Diagnosis not present

## 2023-06-14 DIAGNOSIS — I120 Hypertensive chronic kidney disease with stage 5 chronic kidney disease or end stage renal disease: Secondary | ICD-10-CM | POA: Diagnosis not present

## 2023-06-14 DIAGNOSIS — Z8249 Family history of ischemic heart disease and other diseases of the circulatory system: Secondary | ICD-10-CM

## 2023-06-14 DIAGNOSIS — Z888 Allergy status to other drugs, medicaments and biological substances status: Secondary | ICD-10-CM

## 2023-06-14 DIAGNOSIS — Z86018 Personal history of other benign neoplasm: Secondary | ICD-10-CM

## 2023-06-14 DIAGNOSIS — I132 Hypertensive heart and chronic kidney disease with heart failure and with stage 5 chronic kidney disease, or end stage renal disease: Secondary | ICD-10-CM | POA: Diagnosis present

## 2023-06-14 DIAGNOSIS — N179 Acute kidney failure, unspecified: Secondary | ICD-10-CM | POA: Diagnosis not present

## 2023-06-14 DIAGNOSIS — Z85528 Personal history of other malignant neoplasm of kidney: Secondary | ICD-10-CM

## 2023-06-14 DIAGNOSIS — J189 Pneumonia, unspecified organism: Principal | ICD-10-CM | POA: Diagnosis present

## 2023-06-14 DIAGNOSIS — R918 Other nonspecific abnormal finding of lung field: Secondary | ICD-10-CM | POA: Diagnosis not present

## 2023-06-14 DIAGNOSIS — E43 Unspecified severe protein-calorie malnutrition: Secondary | ICD-10-CM | POA: Diagnosis present

## 2023-06-14 DIAGNOSIS — Z820 Family history of epilepsy and other diseases of the nervous system: Secondary | ICD-10-CM

## 2023-06-14 DIAGNOSIS — N185 Chronic kidney disease, stage 5: Secondary | ICD-10-CM | POA: Diagnosis not present

## 2023-06-14 DIAGNOSIS — I5032 Chronic diastolic (congestive) heart failure: Secondary | ICD-10-CM | POA: Diagnosis not present

## 2023-06-14 DIAGNOSIS — E11621 Type 2 diabetes mellitus with foot ulcer: Secondary | ICD-10-CM | POA: Diagnosis present

## 2023-06-14 DIAGNOSIS — Z800 Family history of malignant neoplasm of digestive organs: Secondary | ICD-10-CM

## 2023-06-14 DIAGNOSIS — I700 Atherosclerosis of aorta: Secondary | ICD-10-CM | POA: Diagnosis not present

## 2023-06-14 DIAGNOSIS — K219 Gastro-esophageal reflux disease without esophagitis: Secondary | ICD-10-CM | POA: Diagnosis present

## 2023-06-14 LAB — GLUCOSE, CAPILLARY
Glucose-Capillary: 19 mg/dL — CL (ref 70–99)
Glucose-Capillary: 41 mg/dL — CL (ref 70–99)
Glucose-Capillary: <10 mg/dL — CL (ref 70–99)
Glucose-Capillary: 13 mg/dL — CL (ref 70–99)
Glucose-Capillary: 16 mg/dL — CL (ref 70–99)
Glucose-Capillary: 278 mg/dL — ABNORMAL HIGH (ref 70–99)
Glucose-Capillary: 61 mg/dL — ABNORMAL LOW (ref 70–99)
Glucose-Capillary: 57 mg/dL — ABNORMAL LOW (ref 70–99)
Glucose-Capillary: 199 mg/dL — ABNORMAL HIGH (ref 70–99)

## 2023-06-14 LAB — BLOOD GAS, ARTERIAL
Acid-Base Excess: 4.3 mmol/L — ABNORMAL HIGH (ref 0.0–2.0)
Bicarbonate: 29.8 mmol/L — ABNORMAL HIGH (ref 20.0–28.0)
Drawn by: 724181
O2 Saturation: 97.4 %
Patient temperature: 36.3
pCO2 arterial: 46 mmHg (ref 32–48)
pH, Arterial: 7.42 (ref 7.35–7.45)
pO2, Arterial: 117 mmHg — ABNORMAL HIGH (ref 83–108)

## 2023-06-14 LAB — PROTIME-INR
INR: 1.3 — ABNORMAL HIGH (ref 0.8–1.2)
Prothrombin Time: 16.2 s — ABNORMAL HIGH (ref 11.4–15.2)

## 2023-06-14 LAB — I-STAT CG4 LACTIC ACID, ED: Lactic Acid, Venous: 1.9 mmol/L (ref 0.5–1.9)

## 2023-06-14 LAB — COMPREHENSIVE METABOLIC PANEL WITH GFR
ALT: 23 U/L (ref 0–44)
AST: 18 U/L (ref 15–41)
Albumin: 1.9 g/dL — ABNORMAL LOW (ref 3.5–5.0)
Alkaline Phosphatase: 84 U/L (ref 38–126)
Anion gap: 13 (ref 5–15)
BUN: 15 mg/dL (ref 8–23)
CO2: 30 mmol/L (ref 22–32)
Calcium: 8 mg/dL — ABNORMAL LOW (ref 8.9–10.3)
Chloride: 97 mmol/L — ABNORMAL LOW (ref 98–111)
Creatinine, Ser: 2.14 mg/dL — ABNORMAL HIGH (ref 0.44–1.00)
GFR, Estimated: 22 mL/min — ABNORMAL LOW (ref >60)
Glucose, Bld: 122 mg/dL — ABNORMAL HIGH (ref 70–99)
Potassium: 2.9 mmol/L — ABNORMAL LOW (ref 3.5–5.1)
Sodium: 140 mmol/L (ref 135–145)
Total Bilirubin: 0.9 mg/dL (ref 0.0–1.2)
Total Protein: 4.9 g/dL — ABNORMAL LOW (ref 6.5–8.1)

## 2023-06-14 LAB — I-STAT CHEM 8, ED
BUN: 20 mg/dL (ref 8–23)
Calcium, Ion: 1.01 mmol/L — ABNORMAL LOW (ref 1.15–1.40)
Chloride: 95 mmol/L — ABNORMAL LOW (ref 98–111)
Creatinine, Ser: 2.3 mg/dL — ABNORMAL HIGH (ref 0.44–1.00)
Glucose, Bld: 112 mg/dL — ABNORMAL HIGH (ref 70–99)
HCT: 32 % — ABNORMAL LOW (ref 36.0–46.0)
Hemoglobin: 10.9 g/dL — ABNORMAL LOW (ref 12.0–15.0)
Potassium: 3.2 mmol/L — ABNORMAL LOW (ref 3.5–5.1)
Sodium: 138 mmol/L (ref 135–145)
TCO2: 33 mmol/L — ABNORMAL HIGH (ref 22–32)

## 2023-06-14 LAB — CBC WITH DIFFERENTIAL/PLATELET
Abs Immature Granulocytes: 0.08 10*3/uL — ABNORMAL HIGH (ref 0.00–0.07)
Basophils Absolute: 0 10*3/uL (ref 0.0–0.1)
Basophils Relative: 0 %
Eosinophils Absolute: 0 10*3/uL (ref 0.0–0.5)
Eosinophils Relative: 0 %
HCT: 32.6 % — ABNORMAL LOW (ref 36.0–46.0)
Hemoglobin: 10 g/dL — ABNORMAL LOW (ref 12.0–15.0)
Immature Granulocytes: 1 %
Lymphocytes Relative: 6 %
Lymphs Abs: 0.7 10*3/uL (ref 0.7–4.0)
MCH: 27.5 pg (ref 26.0–34.0)
MCHC: 30.7 g/dL (ref 30.0–36.0)
MCV: 89.6 fL (ref 80.0–100.0)
Monocytes Absolute: 0.3 10*3/uL (ref 0.1–1.0)
Monocytes Relative: 3 %
Neutro Abs: 11.6 10*3/uL — ABNORMAL HIGH (ref 1.7–7.7)
Neutrophils Relative %: 90 %
Platelets: 122 10*3/uL — ABNORMAL LOW (ref 150–400)
RBC: 3.64 MIL/uL — ABNORMAL LOW (ref 3.87–5.11)
RDW: 20.6 % — ABNORMAL HIGH (ref 11.5–15.5)
WBC: 12.8 10*3/uL — ABNORMAL HIGH (ref 4.0–10.5)
nRBC: 0.2 % (ref 0.0–0.2)

## 2023-06-14 LAB — HEPATITIS B SURFACE ANTIGEN: Hepatitis B Surface Ag: NONREACTIVE

## 2023-06-14 LAB — PREALBUMIN: Prealbumin: 6 mg/dL — ABNORMAL LOW (ref 18–38)

## 2023-06-14 LAB — TROPONIN I (HIGH SENSITIVITY): Troponin I (High Sensitivity): 29 ng/L — ABNORMAL HIGH (ref <18)

## 2023-06-14 LAB — PROCALCITONIN: Procalcitonin: 0.44 ng/mL

## 2023-06-14 LAB — BRAIN NATRIURETIC PEPTIDE: B Natriuretic Peptide: 2475.2 pg/mL — ABNORMAL HIGH (ref 0.0–100.0)

## 2023-06-14 MED ORDER — POTASSIUM CHLORIDE CRYS ER 20 MEQ PO TBCR: 20.0000 meq | EXTENDED_RELEASE_TABLET | ORAL | Status: DC

## 2023-06-14 MED ORDER — ALTEPLASE 2 MG IJ SOLR: 2.0000 mg | Freq: Once | INTRAMUSCULAR | Status: DC | PRN

## 2023-06-14 MED ORDER — BENZONATATE 100 MG PO CAPS: 200.0000 mg | ORAL_CAPSULE | Freq: Three times a day (TID) | ORAL | Status: DC | PRN

## 2023-06-14 MED ORDER — MECLIZINE HCL 25 MG PO TABS: 25.0000 mg | ORAL_TABLET | Freq: Three times a day (TID) | ORAL | Status: DC | PRN

## 2023-06-14 MED ORDER — ACETAMINOPHEN 650 MG RE SUPP: 650.0000 mg | Freq: Four times a day (QID) | RECTAL | Status: DC | PRN

## 2023-06-14 MED ORDER — MIDODRINE HCL 5 MG PO TABS: 5.0000 mg | ORAL_TABLET | Freq: Once | ORAL | Status: DC

## 2023-06-14 MED ORDER — MIDODRINE HCL 5 MG PO TABS
10.0000 mg | ORAL_TABLET | ORAL | Status: DC
  Administered 2023-06-14: 10 mg via ORAL
  Filled 2023-06-14: qty 2

## 2023-06-14 MED ORDER — POLYVINYL ALCOHOL 1.4 % OP SOLN: 1.0000 [drp] | Freq: Every day | OPHTHALMIC | Status: DC | PRN

## 2023-06-14 MED ORDER — DIPHENHYDRAMINE HCL 25 MG PO CAPS
25.0000 mg | ORAL_CAPSULE | Freq: Every day | ORAL | Status: DC
Start: 2023-06-14 — End: 2023-06-15

## 2023-06-14 MED ORDER — ENSURE ENLIVE PO LIQD: 237.0000 mL | Freq: Two times a day (BID) | ORAL | Status: DC

## 2023-06-14 MED ORDER — ADULT MULTIVITAMIN W/MINERALS CH: 1.0000 | ORAL_TABLET | Freq: Every morning | ORAL | Status: DC

## 2023-06-14 MED ORDER — PANTOPRAZOLE SODIUM 40 MG PO TBEC
40.0000 mg | DELAYED_RELEASE_TABLET | Freq: Every day | ORAL | Status: DC
  Administered 2023-06-14: 40 mg via ORAL
  Filled 2023-06-14: qty 1

## 2023-06-14 MED ORDER — ALBUMIN HUMAN 25 % IV SOLN
INTRAVENOUS | Status: AC
  Filled 2023-06-14: qty 100

## 2023-06-14 MED ORDER — LIDOCAINE-PRILOCAINE 2.5-2.5 % EX CREA: 1.0000 | TOPICAL_CREAM | CUTANEOUS | Status: DC | PRN

## 2023-06-14 MED ORDER — AMIODARONE HCL 200 MG PO TABS
200.0000 mg | ORAL_TABLET | Freq: Every day | ORAL | Status: DC
  Administered 2023-06-14 – 2023-06-15 (×2): 200 mg via ORAL
  Filled 2023-06-14: qty 1

## 2023-06-14 MED ORDER — SODIUM CHLORIDE 0.9 % IV SOLN
1.0000 g | Freq: Once | INTRAVENOUS | Status: AC
  Administered 2023-06-14: 1 g via INTRAVENOUS
  Filled 2023-06-14: qty 10

## 2023-06-14 MED ORDER — ANTICOAGULANT SODIUM CITRATE 4% (200MG/5ML) IV SOLN: 5.0000 mL | Status: DC | PRN

## 2023-06-14 MED ORDER — POLYVINYL ALCOHOL 1.4 % OP SOLN
1.0000 [drp] | Freq: Every day | OPHTHALMIC | Status: DC
  Administered 2023-06-15 (×2): 1 [drp] via OPHTHALMIC
  Filled 2023-06-14: qty 15

## 2023-06-14 MED ORDER — ASPIRIN 81 MG PO CHEW
81.0000 mg | CHEWABLE_TABLET | Freq: Every day | ORAL | Status: DC
  Administered 2023-06-14 – 2023-06-15 (×2): 81 mg via ORAL
  Filled 2023-06-14 (×2): qty 1

## 2023-06-14 MED ORDER — MIDODRINE HCL 5 MG PO TABS
5.0000 mg | ORAL_TABLET | Freq: Three times a day (TID) | ORAL | Status: DC
  Administered 2023-06-14: 5 mg via ORAL
  Filled 2023-06-14: qty 1

## 2023-06-14 MED ORDER — ALBUMIN HUMAN 25 % IV SOLN
25.0000 g | Freq: Once | INTRAVENOUS | Status: AC
  Administered 2023-06-14: 25 g via INTRAVENOUS

## 2023-06-14 MED ORDER — ACETAMINOPHEN 325 MG PO TABS
650.0000 mg | ORAL_TABLET | Freq: Four times a day (QID) | ORAL | Status: DC | PRN
  Administered 2023-06-15: 650 mg via ORAL
  Filled 2023-06-14: qty 2

## 2023-06-14 MED ORDER — CHLORHEXIDINE GLUCONATE CLOTH 2 % EX PADS
6.0000 | MEDICATED_PAD | Freq: Every day | CUTANEOUS | Status: DC
  Administered 2023-06-14 – 2023-06-16 (×3): 6 via TOPICAL

## 2023-06-14 MED ORDER — CHLORHEXIDINE GLUCONATE CLOTH 2 % EX PADS
6.0000 | MEDICATED_PAD | Freq: Every day | CUTANEOUS | Status: DC
  Administered 2023-06-15 – 2023-06-16 (×2): 6 via TOPICAL

## 2023-06-14 MED ORDER — TRAMADOL HCL 50 MG PO TABS
50.0000 mg | ORAL_TABLET | Freq: Four times a day (QID) | ORAL | Status: DC | PRN
  Filled 2023-06-14: qty 1

## 2023-06-14 MED ORDER — CINACALCET HCL 30 MG PO TABS: 30.0000 mg | ORAL_TABLET | ORAL | Status: DC

## 2023-06-14 MED ORDER — LIDOCAINE HCL (PF) 1 % IJ SOLN: 5.0000 mL | INTRAMUSCULAR | Status: DC | PRN

## 2023-06-14 MED ORDER — DEXTROSE 50 % IV SOLN
INTRAVENOUS | Status: AC
  Administered 2023-06-14: 50 mL via INTRAVENOUS
  Filled 2023-06-14: qty 50

## 2023-06-14 MED ORDER — DEXTROSE 10 % IV SOLN
INTRAVENOUS | Status: DC
Start: 2023-06-14 — End: 2023-06-14

## 2023-06-14 MED ORDER — ARTIFICIAL TEARS OPHTHALMIC OINT: 1.0000 | TOPICAL_OINTMENT | Freq: Every evening | OPHTHALMIC | Status: DC | PRN

## 2023-06-14 MED ORDER — HEPARIN SODIUM (PORCINE) 5000 UNIT/ML IJ SOLN
5000.0000 [IU] | Freq: Three times a day (TID) | INTRAMUSCULAR | Status: DC
  Filled 2023-06-14: qty 1

## 2023-06-14 MED ORDER — DEXTROSE 50 % IV SOLN
1.0000 | INTRAVENOUS | Status: DC | PRN
  Administered 2023-06-14: 50 mL via INTRAVENOUS
  Filled 2023-06-14: qty 50

## 2023-06-14 MED ORDER — ACETAMINOPHEN 500 MG PO TABS: 500.0000 mg | ORAL_TABLET | Freq: Every day | ORAL | Status: DC

## 2023-06-14 MED ORDER — SODIUM CHLORIDE 0.9 % IV SOLN
2.0000 g | INTRAVENOUS | Status: DC
  Administered 2023-06-15: 2 g via INTRAVENOUS
  Filled 2023-06-14: qty 20

## 2023-06-14 MED ORDER — MIDODRINE HCL 5 MG PO TABS
10.0000 mg | ORAL_TABLET | Freq: Three times a day (TID) | ORAL | Status: DC
  Administered 2023-06-14 – 2023-06-15 (×2): 10 mg via ORAL
  Filled 2023-06-14 (×2): qty 2

## 2023-06-14 MED ORDER — PANCRELIPASE (LIP-PROT-AMYL) 36000-114000 UNITS PO CPEP
36000.0000 [IU] | ORAL_CAPSULE | Freq: Two times a day (BID) | ORAL | Status: DC
  Administered 2023-06-14: 36000 [IU] via ORAL
  Filled 2023-06-14 (×4): qty 1

## 2023-06-14 MED ORDER — SEVELAMER CARBONATE 800 MG PO TABS
1600.0000 mg | ORAL_TABLET | Freq: Two times a day (BID) | ORAL | Status: DC
  Administered 2023-06-14: 1600 mg via ORAL
  Filled 2023-06-14: qty 2

## 2023-06-14 MED ORDER — HEPARIN SODIUM (PORCINE) 1000 UNIT/ML DIALYSIS: 1000.0000 [IU] | INTRAMUSCULAR | Status: DC | PRN

## 2023-06-14 MED ORDER — POTASSIUM CHLORIDE 20 MEQ/100ML IV SOLN
20.0000 meq | Freq: Once | INTRAVENOUS | Status: DC
  Filled 2023-06-14: qty 100

## 2023-06-14 MED ORDER — SODIUM CHLORIDE 0.9% FLUSH
10.0000 mL | Freq: Two times a day (BID) | INTRAVENOUS | Status: DC
  Administered 2023-06-14 – 2023-06-16 (×3): 10 mL

## 2023-06-14 MED ORDER — SODIUM CHLORIDE 0.9% FLUSH: 10.0000 mL | INTRAVENOUS | Status: DC | PRN

## 2023-06-14 MED ORDER — PENTAFLUOROPROP-TETRAFLUOROETH EX AERO: 1.0000 | INHALATION_SPRAY | CUTANEOUS | Status: DC | PRN

## 2023-06-14 MED ORDER — PROSOURCE PLUS PO LIQD
30.0000 mL | Freq: Two times a day (BID) | ORAL | Status: DC
  Administered 2023-06-14: 30 mL via ORAL
  Filled 2023-06-14: qty 30

## 2023-06-14 MED ORDER — POTASSIUM CHLORIDE 10 MEQ/100ML IV SOLN
10.0000 meq | INTRAVENOUS | Status: AC
  Administered 2023-06-14 (×2): 10 meq via INTRAVENOUS
  Filled 2023-06-14 (×2): qty 100

## 2023-06-14 MED ORDER — FUROSEMIDE 10 MG/ML IJ SOLN
80.0000 mg | Freq: Once | INTRAMUSCULAR | Status: AC
  Administered 2023-06-14: 80 mg via INTRAVENOUS
  Filled 2023-06-14: qty 8

## 2023-06-14 MED ORDER — DIPHENHYDRAMINE-APAP (SLEEP) 25-500 MG PO TABS: 2.0000 | ORAL_TABLET | Freq: Every day | ORAL | Status: DC

## 2023-06-14 MED ORDER — ROPINIROLE HCL 0.5 MG PO TABS
0.7500 mg | ORAL_TABLET | Freq: Every day | ORAL | Status: DC
  Administered 2023-06-15: 0.75 mg via ORAL
  Filled 2023-06-14 (×2): qty 1

## 2023-06-14 MED ORDER — METOPROLOL TARTRATE 25 MG PO TABS
25.0000 mg | ORAL_TABLET | Freq: Two times a day (BID) | ORAL | Status: DC
  Administered 2023-06-14: 25 mg via ORAL
  Filled 2023-06-14: qty 1

## 2023-06-14 MED ORDER — SEVELAMER CARBONATE 800 MG PO TABS
800.0000 mg | ORAL_TABLET | Freq: Two times a day (BID) | ORAL | Status: DC
  Administered 2023-06-14: 800 mg via ORAL
  Filled 2023-06-14: qty 1

## 2023-06-14 NOTE — Progress Notes (Signed)
Albumin infusion

## 2023-06-14 NOTE — Progress Notes (Signed)
Pre Hd 

## 2023-06-14 NOTE — ED Triage Notes (Signed)
 The pt is a dialysis pt that has had a noisy cough she was not fully dialyzed she has a catheter in her rt upper chest she has an old fistula in her lt arm  the entire lt arm is blue  she has not been eating multiple skin tears   she looks  very  ill

## 2023-06-14 NOTE — Sepsis Progress Note (Signed)
 Following for sepsis monitoring ?

## 2023-06-14 NOTE — Plan of Care (Signed)
  Problem: Education: Goal: Knowledge of General Education information will improve Description: Including pain rating scale, medication(s)/side effects and non-pharmacologic comfort measures Outcome: Progressing   Problem: Clinical Measurements: Goal: Ability to maintain clinical measurements within normal limits will improve Outcome: Progressing Goal: Will remain free from infection Outcome: Progressing Goal: Diagnostic test results will improve Outcome: Progressing Goal: Respiratory complications will improve Outcome: Progressing Goal: Cardiovascular complication will be avoided Outcome: Progressing   Problem: Elimination: Goal: Will not experience complications related to bowel motility Outcome: Progressing Goal: Will not experience complications related to urinary retention Outcome: Progressing   

## 2023-06-14 NOTE — H&P (Addendum)
 History and Physical    Patient: Kim Burns:[id number] DOB: [birth year] DOA: 06/14/2023 DOS: the patient was seen and examined on 06/14/2023 PCP: [name], MD  Patient coming from: home  Chief Complaint:  Chief Complaint  Patient presents with   Hypotension   HPI: Kim Burns is a 88 y.o. female with medical history significant of ESRD on HD MWF, PAF not on anticoagulation, HFpEF, DM type II,  history of intraductal papillary mucinous neoplasm of the pancreas-s/p Whipple, and history of renal cell carcinoma s/p ablation complicated by urine leak s/p nephrostomy tube presents with cough and shortness of breath.  History is obtained mostly from the patient's son over the phone and review of records with as she is hard of hearing and seems possibly confused.  She attended dialysis yesterday and had taken her midodrine . As a result, her blood pressure was very low, and the dialysis center considered sending her to the ER. After they gave her the medication, her blood pressure improved slightly.  Not totally clear if she had a complete dialysis session at that time.  She has skin tears on her arms, particularly on the right arm, which occurred during a nephrostomy tube exchange at [facility] 2 days ago.  The left arm is more concerning due to significant swelling, which developed over the past three weeks.  Her son feels the the swelling is associated with her fistula and was exacerbated by a Coban wrap that slipped into her elbow joint during a procedure. Records note patient had just underwent fistulogram with venoplasty of the left basilic vein Dr. Charlotte Cookey on 4/8.  She has a persistent cough, which has recently become wet and congested. This change was first noticed yesterday. Previously, she experienced dry coughing spells, often occurring during sleep or naps, which she attributes to a dry throat. She has been using Tessalon  for the cough, but it has not been  effective.  Her mobility has significantly declined over the past three weeks. She used to walk with a walker from her recliner to the bathroom, but now she is unable to do so due to swelling and pain in her legs and feet. She requires assistance to move to the bathroom and back.   In the emergency department patient was noted to be afebrile with pulse reported going from 30-96, respirations 15-27, blood pressures 93/68, 122/90, and O2 saturations maintained currently on room air.  Labs significant for WBC 12.8, hemoglobin 10, platelets 122, potassium 2.9, BUN 50 teen, creatinine 2.14, calcium  8, BNP 2475.2, lactic acid 1.9, and high-sensitivity troponin 29.  Chest x-ray noted patchy left lower lobe and retrocardiac opacity chronic and possibly reflecting scarring/atelectasis although left lower lobe pneumonia not excluded with small bilateral pleural effusions that  are new.  Blood cultures were obtained.  Patient was started on empiric antibiotics of Rocephin .   Review of Systems: As mentioned in the history of present illness. All other systems reviewed and are negative. Past Medical History:  Diagnosis Date   Anemia of chronic disease    takes iron   Anxiety    Atrial fibrillation (HCC)    Blood transfusion without reported diagnosis    Bradycardia    Diabetes mellitus without complication (HCC)    became diabetic after Whipple procedure   Diarrhea    Dysrhythmia 04/16/2016   bradycardia due to medication    ESRD on hemodialysis (HCC)    M-W-F   GERD (gastroesophageal reflux disease)    Gout  Headache    History of kidney stones 06/2013   Hyperparathyroidism (HCC)    Hypertension    PONV (postoperative nausea and vomiting)    Sleep apnea    no cpap machine. could not tolerate   Stroke (HCC) 04/16/2016   TIA 1995   Vitamin D  deficiency    Past Surgical History:  Procedure Laterality Date   A/V FISTULAGRAM Left 08/30/2021   Procedure: A/V Fistulagram;  Surgeon: Young Hensen, MD;  Location: [facility] INVASIVE CV LAB;  Service: Cardiovascular;  Laterality: Left;   A/V FISTULAGRAM N/A 02/25/2023   Procedure: A/V Fistulagram;  Surgeon: Adine Hoof, MD;  Location: [facility] INVASIVE CV LAB;  Service: Vascular;  Laterality: N/A;   A/V FISTULAGRAM N/A 05/27/2023   Procedure: A/V Fistulagram;  Surgeon: Margherita Shell, MD;  Location: [facility] INVASIVE CV LAB;  Service: Cardiovascular;  Laterality: N/A;   A/V SHUNT INTERVENTION N/A 05/27/2023   Procedure: A/V SHUNT INTERVENTION;  Surgeon: Margherita Shell, MD;  Location: [facility] INVASIVE CV LAB;  Service: Cardiovascular;  Laterality: N/A;   ABDOMINAL HYSTERECTOMY  1985   complete   BACK SURGERY  1980   lower   BASCILIC VEIN TRANSPOSITION Left 04/24/2017   Procedure: LEFT ARM FIRST STAGE BASILIC VEIN TRANSPOSITION;  Surgeon: Margherita Shell, MD;  Location: [facility] OR;  Service: Vascular;  Laterality: Left;   BASCILIC VEIN TRANSPOSITION Left 07/10/2017   Procedure: SECOND STAGE BASILIC VEIN TRANSPOSITION LEFT ARM;  Surgeon: Margherita Shell, MD;  Location: [facility] OR;  Service: Vascular;  Laterality: Left;   BREAST LUMPECTOMY Left x 2   many years apart, benign   CHOLECYSTECTOMY  1985   CYSTOSCOPY W/ URETERAL STENT PLACEMENT Left 10/31/2021   Procedure: CYSTOSCOPY WITH RETROGRADE PYELOGRAM/URETERAL STENT PLACEMENT;  Surgeon: Andrez Banker, MD;  Location: [facility] OR;  Service: Urology;  Laterality: Left;   CYSTOSCOPY WITH URETEROSCOPY AND STENT PLACEMENT Left 06/18/2013   Procedure: CYSTOSCOPY WITH Lef URETEROSCOPY AND Left STENT PLACEMENT;  Surgeon: Kristeen Peto, MD;  Location: [facility] ORS;  Service: Urology;  Laterality: Left;   DIALYSIS/PERMA CATHETER INSERTION N/A 03/13/2023   Procedure: DIALYSIS/PERMA CATHETER INSERTION;  Surgeon: Melodie Spry, MD;  Location: [facility] INVASIVE CV LAB;  Service: Cardiovascular;  Laterality: N/A;   ESOPHAGOGASTRODUODENOSCOPY (EGD) WITH PROPOFOL  N/A 11/13/2016   Procedure: ESOPHAGOGASTRODUODENOSCOPY (EGD) WITH  PROPOFOL ;  Surgeon: Felecia Hopper, MD;  Location: [facility] ENDOSCOPY;  Service: Gastroenterology;  Laterality: N/A;   EUS N/A 02/07/2016   Procedure: ESOPHAGEAL ENDOSCOPIC ULTRASOUND (EUS) RADIAL;  Surgeon: Evangeline Hilts, MD;  Location: [facility] ENDOSCOPY;  Service: Endoscopy;  Laterality: N/A;   EYE SURGERY Bilateral 2014   ioc for catracts    FOOT SURGERY Left 1990   something with toes unsure what    HERNIA REPAIR  2018   IR CATHETER TUBE CHANGE  03/14/2022   IR EMBO TUMOR ORGAN ISCHEMIA INFARCT INC GUIDE ROADMAPPING  08/22/2021   IR NEPHROSTOMY EXCHANGE LEFT  06/27/2022   IR NEPHROSTOMY EXCHANGE LEFT  08/26/2022   IR NEPHROSTOMY EXCHANGE LEFT  10/22/2022   IR NEPHROSTOMY EXCHANGE LEFT  12/16/2022   IR NEPHROSTOMY EXCHANGE LEFT  12/30/2022   IR NEPHROSTOMY EXCHANGE LEFT  02/10/2023   IR NEPHROSTOMY EXCHANGE LEFT  04/07/2023   IR NEPHROSTOMY EXCHANGE LEFT  06/12/2023   IR NEPHROSTOMY PLACEMENT LEFT  03/14/2022   IR NEPHROSTOMY PLACEMENT LEFT  05/01/2023   IR RADIOLOGIST EVAL & MGMT  05/18/2021   IR RADIOLOGIST EVAL & MGMT  10/01/2021   IR RADIOLOGIST EVAL &  MGMT  11/12/2021   IR RADIOLOGIST EVAL & MGMT  11/22/2021   IR RADIOLOGIST EVAL & MGMT  11/27/2021   IR RADIOLOGIST EVAL & MGMT  12/20/2021   IR RENAL SUPRASEL UNI S&I MOD SED  08/22/2021   IR US  GUIDE VASC ACCESS RIGHT  08/22/2021   LEFT HEART CATH AND CORONARY ANGIOGRAPHY N/A 08/27/2021   Procedure: LEFT HEART CATH AND CORONARY ANGIOGRAPHY;  Surgeon: Odie Benne, MD;  Location: [facility] INVASIVE CV LAB;  Service: Cardiovascular;  Laterality: N/A;   PARATHYROID  EXPLORATION     PERIPHERAL VASCULAR BALLOON ANGIOPLASTY Left 08/30/2021   Procedure: PERIPHERAL VASCULAR BALLOON ANGIOPLASTY;  Surgeon: Young Hensen, MD;  Location: [facility] INVASIVE CV LAB;  Service: Cardiovascular;  Laterality: Left;  arm fistula   PERIPHERAL VASCULAR BALLOON ANGIOPLASTY Left 02/25/2023   Procedure: PERIPHERAL VASCULAR BALLOON ANGIOPLASTY;  Surgeon: Adine Hoof, MD;  Location: [facility] INVASIVE CV LAB;  Service: Vascular;  Laterality: Left;  left AVF   RADIOLOGY WITH ANESTHESIA Left 08/22/2021   Procedure: MICROWAVE ABLATION;  Surgeon: Robbi Childs, MD;  Location: [facility] ORS;  Service: Anesthesiology;  Laterality: Left;   WHIPPLE PROCEDURE N/A 04/23/2016   Procedure: WHIPPLE PROCEDURE;  Surgeon: Lockie Rima, MD;  Location: [facility] OR;  Service: General;  Laterality: N/A;   Social History:  reports that she has never smoked. She has never used smokeless tobacco. She reports that she does not drink alcohol  and does not use drugs.  Allergies  Allergen Reactions   Lopid [Gemfibrozil] Other (See Comments)    Myalgia    Lotemax [Loteprednol Etabonate] Rash    Skin burning  Blurry vision   Statins Other (See Comments)    Rosuvastatin Muscle weakness   Other Diarrhea    Real Butter - diarrhea; patient still uses butter   Vibra -Tab [Doxycycline ] Other (See Comments)    Unknown reaction   Amlodipine Other (See Comments)    Norvasc   Codeine Other (See Comments)    headache   Hydrocodone      Headache    Lisinopril Other (See Comments)   Verapamil  Other (See Comments)    Family History  Problem Relation Age of Onset   Heart disease Mother    Stroke Mother    Cancer Father        colon   Alzheimer's disease Sister    Alzheimer's disease Sister    Cancer Brother        brain   Breast cancer Neg Hx     Prior to Admission medications   Medication Sig Start Date End Date Taking? Authorizing Provider  amiodarone  (PACERONE ) 200 MG tablet Take 1 tablet (200 mg total) by mouth daily. 11/15/22  Yes Love, Renay Carota, PA-C  aspirin  81 MG chewable tablet Chew 1 tablet (81 mg total) by mouth daily. 11/13/22  Yes Love, Renay Carota, PA-C  B Complex-C-Zn-Folic Acid  (DIALYVITE 800 WITH ZINC ) 0.8 MG TABS Take 1 tablet by mouth in the morning.   Yes [provider]  Carboxymethylcell-Glycerin  PF (REFRESH TEARS PF) 0.5-0.9 % SOLN Place 1 drop into  both eyes at bedtime.   Yes [provider]  diphenhydramine -acetaminophen  (TYLENOL  PM) 25-500 MG TABS tablet Take 2 tablets by mouth at bedtime.   Yes [provider]  Lifitegrast  5 % SOLN Place 1 drop into both eyes daily as needed (for dryness). 03/10/23  Yes [provider]  lipase/protease/amylase (CREON ) 36000 UNITS CPEP capsule Take 36,000 Units by mouth 2 (two) times daily after a meal.  Yes [provider]  meclizine  (ANTIVERT ) 25 MG tablet Take 1 tablet (25 mg total) by mouth 3 (three) times daily as needed for dizziness. 01/02/23 01/02/24 Yes Althia Atlas, MD  metoprolol  tartrate (LOPRESSOR ) 25 MG tablet Take 1 tablet (25 mg total) by mouth 2 (two) times daily. 04/18/23  Yes Ghimire, Estil Heman, MD  midodrine  (PROAMATINE ) 10 MG tablet Take 0.5 tablets (5 mg total) by mouth 3 (three) times daily. Take 10mg  by mouth before dialysis. Bring with you to dialysis in case another dose is required. May take 10 mg 3 times a day if systolic BP less than 120 mmHg 04/18/23 04/17/24 Yes Ghimire, Estil Heman, MD  pantoprazole  (PROTONIX ) 40 MG tablet Take 40 mg by mouth daily.   Yes [provider]  rOPINIRole  (REQUIP ) 0.25 MG tablet Take 3 tablets (0.75 mg total) by mouth at bedtime. 04/18/23  Yes Burton Casey, MD  SENSIPAR  30 MG tablet Take 30 mg by mouth every Monday, Wednesday, and Friday. 05/26/23  Yes [provider]  sevelamer  carbonate (RENVELA ) 800 MG tablet Take 1 tablet (800 mg total) by mouth See admin instructions. Take 800mg  by mouth with meals or snacks. Patient taking differently: Take 800 mg by mouth 2 (two) times daily after a meal. 04/18/23  Yes Ghimire, Estil Heman, MD  traMADol  (ULTRAM ) 50 MG tablet Take 50 mg by mouth every 6 (six) hours as needed for moderate pain (pain score 4-6).   Yes [provider]  White Petrolatum-Mineral Oil (SYSTANE NIGHTTIME) OINT Apply 1 Application to eye at bedtime as needed (severe dry eyes).    Yes [provider]  benzonatate  (TESSALON ) 200 MG capsule Take 200 mg by mouth 3 (three) times daily as needed for cough. Patient not taking: Reported on 06/14/2023 04/08/23   [provider]  lidocaine -prilocaine  (EMLA ) cream Apply 1 Application topically every Monday, Wednesday, and Friday with hemodialysis. Patient not taking: Reported on 06/10/2023    [provider]    Physical Exam: Vitals:   06/14/23 0426 06/14/23 0515 06/14/23 0530 06/14/23 0600  BP: 99/75  97/85 99/63  Pulse: 89 (!) 52 (!) 55 93  Resp: (!) 25 19 (!) 22 19  Temp: (!) 97.2 F (36.2 C)     TempSrc: Axillary     SpO2: 96% 92%  95%  Weight:      Height:       Exam  Constitutional: Elderly female who appears chronically ill Eyes: PERRL, lids and conjunctivae normal ENMT: Mucous membranes are dry.  Fair dentition.  Hard of hearing. Neck: normal, supple, no masses, no thyromegaly Respiratory: Patient has crackles and rhonchi present was noted with on left mid to lower lung field.  Able to talk in fairly complete sentences. Cardiovascular: Regular rate and rhythm, no murmurs / rubs / gallops.  At least +2 pitting bilateral lower extremity edema.  Left upper extremity fistula present with palpable thrill. Abdomen: no tenderness, no masses palpated. No hepatosplenomegaly. Bowel sounds positive.  Musculoskeletal: cyanosis present on bilateral hands. No joint deformity upper and lower extremities. Good ROM, no contractures. Normal muscle tone.  Skin: Bruising noted on the bilateral upper extremities with multiple skin tears.  Venous stasis/possible erythema changes noted of the lower extremities with ulceration noted of the second left toe. Neurologic: CN 2-12 grossly intact. Sensation intact, DTR normal. Strength 5/5 in all 4.  Psychiatric: Normal judgment and insight. Alert and oriented x 3. Normal mood.   Data Reviewed:  EKG revealed atrial fibrillation at  91 bpm with QTc 521.  Reviewed  labs, imaging, and pertinent records as documented.   Assessment and Plan:  Fluid overload ESRD on HD Patient appears to have increased swelling of the lower extremities as well as left arm.  She normally dialyzes Monday Wednesday, and Friday.  Dialyzed 4/25, but unclear if completed session due to hypotension.  Labs note potassium 3.2, BUN 20, creatinine 2.3, and BNP 2475 which is higher than most recent checks.  Unclear if this is the cause for her increased productive cough.  O2 saturations were initially maintained, but nursing staff noted issues with obtaining pulse oximetry readings. - Admit to a progressive bed - Continues to oximetry with nasal cannula oxygen to maintain O2 saturation greater than 92% - Strict I&O's and daily weights - Check ABG - Lasix  80 mg IV x 1 dose - Continue current medication regimen - Nephrology consulted,  will follow-up for any additional recommendations  Skin tears of the bilateral upper extremities Ulcer of the left second toe  Possible cellulitis Patient noted to have multiple skin tears of the upper extremity thought related with recent hospitalization for nephrostomy tube exchange 2 days ago, but also noted to have ulcers of left second toe with possible erythema/venous stasis on physical exam.   - Follow-up blood cultures - Continue empiric antibiotics of Rocephin  - Wound care consulted  Possible community-acquired pneumonia Acute.  Patient reportedly has a chronic cough that is usually dry, but recently became wet.  Chest x-ray concern for possible left sided pneumonia.  There was concern for possibility of patient aspirating has she got choked up on water  while in the ED.  Unclear if symptoms secondary to a pneumonia versus possible fluid overload - Check procalcitonin - Incentive spirometry and flutter valve - Antibiotics as noted above - Mucinex    Leukocytosis Acute.  WBC elevated at 12.8.  Unclear if this is related to possible infection  related wound/skin infection versus nephrostomy tube placement versus possibility of pneumonia.   Initial lactic acid was reassuring. - Follow-up blood cultures - Recheck CBC tomorrow morning  Addendum: Hypoglycemia Acute.  Patient was noted to be altered and not responding like normal.  CBG 41.  Tx w/ D50 and second amp given of D50.  with repeat checks noted to still be low 10->19-> 13-> 16.  Not on insulin .  Hypoglycemia was also documented during her last hospitalization in February and other times when searching her history. Placed temporarily on D10 drip with repeat check prior earlobe noted to be 278 with improvement in mental status and patient more alert and awake at this time back to her baseline. - Hypoglycemic protocols - CBGs  - D50 as needed  Hypotension Chronic.  Blood pressures currently maintained.  Patient on midodrine . -Continue midodrine  5 mg 3 times daily  Hypokalemia Acute.  Potassium noted to be 3.2.   - Give 20 meq of potassium chloride  - Continue to monitor and replace as needed  Prolonged QT interval - QTc noted to be 521. - Correct electrolyte abnormalities and avoiding QT prolonging medications  Persistent atrial fibrillation Chronic.  Patient not on anticoagulation and appears currently rate controlled. - Continue amiodarone   Protein calorie malnutrition Albumin  noted to be significantly low at 1.9.   - Add-on prealbumin - Ensure in between meals  Debility Acute on chronic.  At baseline patient ambulates with the use of a walker, but the last 3 weeks has had progressive decline unable to walk a few feet without assistance. - Physical  therapy to evaluate and treat  DVT prophylaxis: Patient declined heparin  due to bruising.  SCDs Advance Care Planning:   Code Status: Full Code    Consults: Nephrology  Family Communication: Son updated over the phone  Severity of Illness: The appropriate patient status for this patient is INPATIENT. Inpatient  status is judged to be reasonable and necessary in order to provide the required intensity of service to ensure the patient's safety. The patient's presenting symptoms, physical exam findings, and initial radiographic and laboratory data in the context of their chronic comorbidities is felt to place them at high risk for further clinical deterioration. Furthermore, it is not anticipated that the patient will be medically stable for discharge from the hospital within 2 midnights of admission.   * I certify that at the point of admission it is my clinical judgment that the patient will require inpatient hospital care spanning beyond 2 midnights from the point of admission due to high intensity of service, high risk for further deterioration and high frequency of surveillance required.*  Author: Lena Qualia, MD 06/14/2023 7:51 AM  For on call review [email or web address].

## 2023-06-14 NOTE — ED Notes (Signed)
Patient left the floor in stable condition with staff.  

## 2023-06-14 NOTE — Progress Notes (Signed)
 Uf off for now/ Will start Albumin  25 grams

## 2023-06-14 NOTE — Significant Event (Addendum)
 Rapid Response Event Note   Reason for Call :  Lethargy, RN unable to obtain SpO2 reading  Initial Focused Assessment:  Called to bedside for lethargy. Pt will wake up to voice and answers orientation questions appropriately. Breathing is regular, unlabored. No distress. Skin is cool to touch. Nail beds are dusky, pale. Pt LUE is noted to be weeping. 2+ pitting edema in lower extremities.   CBG: 41 VS: T 97.6, BP 116/84, HR 86, RR 19, SpO2 97% on 2LNC  Interventions:  -RT at bedside obtaining ABG on my arrival -RN treating CBG with 25g IV Dextrose   Plan of Care:  -Repeated CBG checks read <10, 19, 13, and 16 - pt had received a second 25g IV Dextrose  and D10% gtt had been started at 31mL/hr.  -Verbal order to check CBG from pt ear lobe received from Dr. Zaida Hertz, CBG 278. Dextrose  gtt stopped per Dr. Zaida Hertz and R. Bevin Bucks, NP.    Event Summary:  MD Notified: Dr. Eino Gravel, per primary RN. Dr. Zaida Hertz and R. Bevin Bucks, NP at bedside.  Call Time: 1202 Arrival Time: 1208  End Time: 1330  Washington Hacker, RN

## 2023-06-14 NOTE — ED Notes (Signed)
 Patient choked while taking PO medications, she was pulled up and sitting up in bed, declined applesauce for thicker consistency to help with ease of swallowing, Smith,MD notified and receiving RN.

## 2023-06-14 NOTE — ED Notes (Signed)
 Patient was given a few sips of water  tolerated well.

## 2023-06-14 NOTE — Progress Notes (Signed)
 RT note. Abg collected and sent to lab, lab notified.

## 2023-06-14 NOTE — ED Triage Notes (Signed)
 The pts son does not want her stuck for blood he wants an iv placed  and lab drawn from that

## 2023-06-14 NOTE — Progress Notes (Signed)
Machine check 

## 2023-06-14 NOTE — Progress Notes (Signed)
 Informed consent obtained from patient's son Kim Burns [facility]) via phone and witnessed by the primary nurse Rice Chamorro, RN.

## 2023-06-14 NOTE — Consult Note (Signed)
 [facility] Renal Consultation Note    Indication for Consultation:  Management of ESRD/hemodialysis; anemia, hypertension/volume and secondary hyperparathyroidism PCP: Dr. Rayma Calandra Nephrologist: Dr. Edson Graces HPI: Kim Burns is a 88 y.o. female with ESRD on hemodialysis MWF at [facility]. PMH DMT2 , PAF, RCC S/P ablation, CVA, gout.    Patient seen by our PA at HD center. Patient could not remember if she took midodrine  and consequently was hypotensive and unable to have any volume removed. Also C/O erythema L arm. She refused to come to hospital 07/13/2023 for evaluation. She did complete full treatment and left 1.2 kg above OP EDW.   She presented to ED this AM. Labs unremarkable for ESRD other than low K+. WBC 12.8 HGB 10.0 PLT 122. Lactic acid negative. CXR remarkable for Patchy left lower lobe/retrocardiac opacity, chronic and possibly reflecting scarring/atelectasis, although left lower lobe pneumonia is not excluded. New small bilateral pleural effusions noted.   She has been admitted for volume overload. She did receive Furosemide  80 mg IV in ED but no UOP documented. Arrived at bedside and PCCM has been called for hypoglycemia. BS reported in 13-16 range-pt has been started on D10W. RN unable to get blood from finger. Told to stick earlobe-BS 278. Patient is awake, oriented, to person and place, confused about time. She has cough, upper airway rhonchi, pitting edema. Apparently she does not make urine so unable to give diuretics to offset volume overload while we wait for HD.  Will order HD for volume removal today. Use midodrine  and albumin  for BP support.   Past Medical History:  Diagnosis Date   Anemia of chronic disease    takes iron   Anxiety    Atrial fibrillation (HCC)    Blood transfusion without reported diagnosis    Bradycardia    Diabetes mellitus without complication (HCC)    became diabetic after Whipple procedure    Diarrhea    Dysrhythmia 04/16/2016   bradycardia due to medication    ESRD on hemodialysis [facility])    M-W-F   GERD (gastroesophageal reflux disease)    Gout    Headache    History of kidney stones 06/2013   Hyperparathyroidism (HCC)    Hypertension    PONV (postoperative nausea and vomiting)    Sleep apnea    no cpap machine. could not tolerate   Stroke (HCC) 04/16/2016   TIA 1995   Vitamin D  deficiency    Past Surgical History:  Procedure Laterality Date   A/V FISTULAGRAM Left 08/30/2021   Procedure: A/V Fistulagram;  Surgeon: Young Hensen, MD;  Location: [facility] INVASIVE CV LAB;  Service: Cardiovascular;  Laterality: Left;   A/V FISTULAGRAM N/A 02/25/2023   Procedure: A/V Fistulagram;  Surgeon: Adine Hoof, MD;  Location: [facility] INVASIVE CV LAB;  Service: Vascular;  Laterality: N/A;   A/V FISTULAGRAM N/A 05/27/2023   Procedure: A/V Fistulagram;  Surgeon: Margherita Shell, MD;  Location: [facility] INVASIVE CV LAB;  Service: Cardiovascular;  Laterality: N/A;   A/V SHUNT INTERVENTION N/A 05/27/2023   Procedure: A/V SHUNT INTERVENTION;  Surgeon: Margherita Shell, MD;  Location: [facility] INVASIVE CV LAB;  Service: Cardiovascular;  Laterality: N/A;   ABDOMINAL HYSTERECTOMY  1985   complete   BACK SURGERY  1980   lower   BASCILIC VEIN TRANSPOSITION Left 04/24/2017   Procedure: LEFT ARM FIRST STAGE BASILIC VEIN TRANSPOSITION;  Surgeon: Margherita Shell, MD;  Location: [facility] OR;  Service: Vascular;  Laterality: Left;  BASCILIC VEIN TRANSPOSITION Left 07/10/2017   Procedure: SECOND STAGE BASILIC VEIN TRANSPOSITION LEFT ARM;  Surgeon: Margherita Shell, MD;  Location: [facility] OR;  Service: Vascular;  Laterality: Left;   BREAST LUMPECTOMY Left x 2   many years apart, benign   CHOLECYSTECTOMY  1985   CYSTOSCOPY W/ URETERAL STENT PLACEMENT Left 10/31/2021   Procedure: CYSTOSCOPY WITH RETROGRADE PYELOGRAM/URETERAL STENT PLACEMENT;  Surgeon: Andrez Banker, MD;  Location: [facility] OR;  Service: Urology;   Laterality: Left;   CYSTOSCOPY WITH URETEROSCOPY AND STENT PLACEMENT Left 06/18/2013   Procedure: CYSTOSCOPY WITH Lef URETEROSCOPY AND Left STENT PLACEMENT;  Surgeon: Kristeen Peto, MD;  Location: [facility] ORS;  Service: Urology;  Laterality: Left;   DIALYSIS/PERMA CATHETER INSERTION N/A 03/13/2023   Procedure: DIALYSIS/PERMA CATHETER INSERTION;  Surgeon: Melodie Spry, MD;  Location: [facility] INVASIVE CV LAB;  Service: Cardiovascular;  Laterality: N/A;   ESOPHAGOGASTRODUODENOSCOPY (EGD) WITH PROPOFOL  N/A 11/13/2016   Procedure: ESOPHAGOGASTRODUODENOSCOPY (EGD) WITH PROPOFOL ;  Surgeon: Felecia Hopper, MD;  Location: [facility] ENDOSCOPY;  Service: Gastroenterology;  Laterality: N/A;   EUS N/A 02/07/2016   Procedure: ESOPHAGEAL ENDOSCOPIC ULTRASOUND (EUS) RADIAL;  Surgeon: Evangeline Hilts, MD;  Location: [facility] ENDOSCOPY;  Service: Endoscopy;  Laterality: N/A;   EYE SURGERY Bilateral 2014   ioc for catracts    FOOT SURGERY Left 1990   something with toes unsure what    HERNIA REPAIR  2018   IR CATHETER TUBE CHANGE  03/14/2022   IR EMBO TUMOR ORGAN ISCHEMIA INFARCT INC GUIDE ROADMAPPING  08/22/2021   IR NEPHROSTOMY EXCHANGE LEFT  06/27/2022   IR NEPHROSTOMY EXCHANGE LEFT  08/26/2022   IR NEPHROSTOMY EXCHANGE LEFT  10/22/2022   IR NEPHROSTOMY EXCHANGE LEFT  12/16/2022   IR NEPHROSTOMY EXCHANGE LEFT  12/30/2022   IR NEPHROSTOMY EXCHANGE LEFT  02/10/2023   IR NEPHROSTOMY EXCHANGE LEFT  04/07/2023   IR NEPHROSTOMY EXCHANGE LEFT  06/12/2023   IR NEPHROSTOMY PLACEMENT LEFT  03/14/2022   IR NEPHROSTOMY PLACEMENT LEFT  05/01/2023   IR RADIOLOGIST EVAL & MGMT  05/18/2021   IR RADIOLOGIST EVAL & MGMT  10/01/2021   IR RADIOLOGIST EVAL & MGMT  11/12/2021   IR RADIOLOGIST EVAL & MGMT  11/22/2021   IR RADIOLOGIST EVAL & MGMT  11/27/2021   IR RADIOLOGIST EVAL & MGMT  12/20/2021   IR RENAL SUPRASEL UNI S&I MOD SED  08/22/2021   IR US  GUIDE VASC ACCESS RIGHT  08/22/2021   LEFT HEART CATH AND CORONARY ANGIOGRAPHY N/A 08/27/2021    Procedure: LEFT HEART CATH AND CORONARY ANGIOGRAPHY;  Surgeon: Odie Benne, MD;  Location: [facility] INVASIVE CV LAB;  Service: Cardiovascular;  Laterality: N/A;   PARATHYROID  EXPLORATION     PERIPHERAL VASCULAR BALLOON ANGIOPLASTY Left 08/30/2021   Procedure: PERIPHERAL VASCULAR BALLOON ANGIOPLASTY;  Surgeon: Young Hensen, MD;  Location: [facility] INVASIVE CV LAB;  Service: Cardiovascular;  Laterality: Left;  arm fistula   PERIPHERAL VASCULAR BALLOON ANGIOPLASTY Left 02/25/2023   Procedure: PERIPHERAL VASCULAR BALLOON ANGIOPLASTY;  Surgeon: Adine Hoof, MD;  Location: [facility] INVASIVE CV LAB;  Service: Vascular;  Laterality: Left;  left AVF   RADIOLOGY WITH ANESTHESIA Left 08/22/2021   Procedure: MICROWAVE ABLATION;  Surgeon: Robbi Childs, MD;  Location: [facility] ORS;  Service: Anesthesiology;  Laterality: Left;   WHIPPLE PROCEDURE N/A 04/23/2016   Procedure: WHIPPLE PROCEDURE;  Surgeon: Lockie Rima, MD;  Location: [facility] OR;  Service: General;  Laterality: N/A;   Family History  Problem Relation Age of Onset   Heart disease  Mother    Stroke Mother    Cancer Father        colon   Alzheimer's disease Sister    Alzheimer's disease Sister    Cancer Brother        brain   Breast cancer Neg Hx    Social History:  reports that she has never smoked. She has never used smokeless tobacco. She reports that she does not drink alcohol  and does not use drugs. Allergies  Allergen Reactions   Lopid [Gemfibrozil] Other (See Comments)    Myalgia    Lotemax [Loteprednol Etabonate] Rash    Skin burning  Blurry vision   Statins Other (See Comments)    Rosuvastatin Muscle weakness   Other Diarrhea    Real Butter - diarrhea; patient still uses butter   Vibra -Tab [Doxycycline ] Other (See Comments)    Unknown reaction   Amlodipine Other (See Comments)    Norvasc   Codeine Other (See Comments)    headache   Hydrocodone      Headache    Lisinopril Other (See Comments)   Verapamil  Other (See  Comments)   Prior to Admission medications   Medication Sig Start Date End Date Taking? Authorizing Provider  amiodarone  (PACERONE ) 200 MG tablet Take 1 tablet (200 mg total) by mouth daily. 11/15/22  Yes Love, Renay Carota, PA-C  aspirin  81 MG chewable tablet Chew 1 tablet (81 mg total) by mouth daily. 11/13/22  Yes Love, Renay Carota, PA-C  B Complex-C-Zn-Folic Acid  (DIALYVITE 800 WITH ZINC ) 0.8 MG TABS Take 1 tablet by mouth in the morning.   Yes [provider]  Carboxymethylcell-Glycerin  PF (REFRESH TEARS PF) 0.5-0.9 % SOLN Place 1 drop into both eyes at bedtime.   Yes [provider]  diphenhydramine -acetaminophen  (TYLENOL  PM) 25-500 MG TABS tablet Take 2 tablets by mouth at bedtime.   Yes [provider]  Lifitegrast  5 % SOLN Place 1 drop into both eyes daily as needed (for dryness). 03/10/23  Yes [provider]  lipase/protease/amylase (CREON ) 36000 UNITS CPEP capsule Take 36,000 Units by mouth 2 (two) times daily after a meal.   Yes [provider]  meclizine  (ANTIVERT ) 25 MG tablet Take 1 tablet (25 mg total) by mouth 3 (three) times daily as needed for dizziness. 01/02/23 01/02/24 Yes Althia Atlas, MD  metoprolol  tartrate (LOPRESSOR ) 25 MG tablet Take 1 tablet (25 mg total) by mouth 2 (two) times daily. 04/18/23  Yes Ghimire, Estil Heman, MD  midodrine  (PROAMATINE ) 10 MG tablet Take 0.5 tablets (5 mg total) by mouth 3 (three) times daily. Take 10mg  by mouth before dialysis. Bring with you to dialysis in case another dose is required. May take 10 mg 3 times a day if systolic BP less than 120 mmHg 04/18/23 04/17/24 Yes Ghimire, Estil Heman, MD  pantoprazole  (PROTONIX ) 40 MG tablet Take 40 mg by mouth daily.   Yes [provider]  rOPINIRole  (REQUIP ) 0.25 MG tablet Take 3 tablets (0.75 mg total) by mouth at bedtime. 04/18/23  Yes Burton Casey, MD  SENSIPAR  30 MG tablet Take 30 mg by mouth every Monday, Wednesday, and Friday. 05/26/23  Yes [provider]  sevelamer  carbonate (RENVELA ) 800 MG tablet Take 1 tablet (800 mg total) by mouth See admin instructions. Take 800mg  by mouth with meals or snacks. Patient taking differently: Take 800 mg by mouth 2 (two) times daily after a meal. 04/18/23  Yes Ghimire, Estil Heman, MD  traMADol  (ULTRAM ) 50 MG tablet Take 50 mg by  mouth every 6 (six) hours as needed for moderate pain (pain score 4-6).   Yes [provider]  White Petrolatum-Mineral Oil (SYSTANE NIGHTTIME) OINT Apply 1 Application to eye at bedtime as needed (severe dry eyes).   Yes [provider]  benzonatate  (TESSALON ) 200 MG capsule Take 200 mg by mouth 3 (three) times daily as needed for cough. Patient not taking: Reported on 06/14/2023 04/08/23   [provider]  lidocaine -prilocaine  (EMLA ) cream Apply 1 Application topically every Monday, Wednesday, and Friday with hemodialysis. Patient not taking: Reported on 06/10/2023    [provider]   Current Facility-Administered Medications  Medication Dose Route Frequency Provider Last Rate Last Admin   acetaminophen  (TYLENOL ) tablet 650 mg  650 mg Oral Q6H PRN Smith, Rondell A, MD       Or   acetaminophen  (TYLENOL ) suppository 650 mg  650 mg Rectal Q6H PRN Lena Qualia, MD       diphenhydrAMINE  (BENADRYL ) capsule 25 mg  25 mg Oral QHS Smith, Rondell A, MD       And   acetaminophen  (TYLENOL ) tablet 500 mg  500 mg Oral QHS Smith, Rondell A, MD       amiodarone  (PACERONE ) tablet 200 mg  200 mg Oral Daily Felipe Horton, Rondell A, MD   200 mg at 06/14/23 0941   artificial tears (LACRILUBE) ophthalmic ointment 1 Application  1 Application Both Eyes QHS PRN Lena Qualia, MD       aspirin  chewable tablet 81 mg  81 mg Oral Daily Felipe Horton, Rondell A, MD   81 mg at 06/14/23 0940   benzonatate  (TESSALON ) capsule 200 mg  200 mg Oral TID PRN Smith, Rondell A, MD       [START ON 06/15/2023] cefTRIAXone  (ROCEPHIN ) 2 g in sodium chloride  0.9 % 100 mL IVPB  2 g  Intravenous Q24H Smith, Rondell A, MD       Chlorhexidine  Gluconate Cloth 2 % PADS 6 each  6 each Topical Daily Manny Sees A, MD   6 each at 06/14/23 1127   [START ON 06/16/2023] cinacalcet  (SENSIPAR ) tablet 30 mg  30 mg Oral Q M,W,F Smith, Rondell A, MD       dextrose  10 % infusion   Intravenous Continuous Smith, Rondell A, MD       dextrose  50 % solution 50 mL  1 ampule Intravenous PRN Smith, Rondell A, MD       dextrose  50 % solution            dextrose  50 % solution            feeding supplement (ENSURE ENLIVE / ENSURE PLUS) liquid 237 mL  237 mL Oral BID BM Smith, Rondell A, MD       furosemide  (LASIX ) injection 80 mg  80 mg Intravenous Once Smith, Rondell A, MD       heparin  injection 5,000 Units  5,000 Units Subcutaneous Q8H Smith, Rondell A, MD       lipase/protease/amylase (CREON ) capsule 36,000 Units  36,000 Units Oral BID PC Smith, Rondell A, MD       meclizine  (ANTIVERT ) tablet 25 mg  25 mg Oral TID PRN Smith, Rondell A, MD       midodrine  (PROAMATINE ) tablet 5 mg  5 mg Oral TID Smith, Rondell A, MD   5 mg at 06/14/23 0939   [START ON 06/15/2023] multivitamin with minerals tablet 1 tablet  1 tablet Oral q AM Lena Qualia, MD  pantoprazole  (PROTONIX ) EC tablet 40 mg  40 mg Oral Daily Smith, Rondell A, MD   40 mg at 06/14/23 0981   polyvinyl alcohol  (LIQUIFILM TEARS) 1.4 % ophthalmic solution 1 drop  1 drop Both Eyes QHS Smith, Rondell A, MD       polyvinyl alcohol  (LIQUIFILM TEARS) 1.4 % ophthalmic solution 1 drop  1 drop Both Eyes Daily PRN Manny Sees A, MD       potassium chloride  10 mEq in 100 mL IVPB  10 mEq Intravenous Q1 Hr x 2 Raliegh Burgess, [facility]       rOPINIRole  (REQUIP ) tablet 0.75 mg  0.75 mg Oral QHS Smith, Rondell A, MD       sevelamer  carbonate (RENVELA ) tablet 800 mg  800 mg Oral BID WC Smith, Rondell A, MD   800 mg at 06/14/23 0939   sodium chloride  flush (NS) 0.9 % injection 10-40 mL  10-40 mL Intracatheter Q12H Smith, Rondell A, MD       sodium  chloride flush (NS) 0.9 % injection 10-40 mL  10-40 mL Intracatheter PRN Manny Sees A, MD       traMADol  (ULTRAM ) tablet 50 mg  50 mg Oral Q6H PRN Lena Qualia, MD       Labs: Basic Metabolic Panel: Recent Labs  Lab 06/14/23 0400 06/14/23 0424  NA 140 138  K 2.9* 3.2*  CL 97* 95*  CO2 30  --   GLUCOSE 122* 112*  BUN 15 20  CREATININE 2.14* 2.30*  CALCIUM  8.0*  --    Liver Function Tests: Recent Labs  Lab 06/14/23 0400  AST 18  ALT 23  ALKPHOS 84  BILITOT 0.9  PROT 4.9*  ALBUMIN  1.9*   No results for input(s): "LIPASE", "AMYLASE" in the last 168 hours. No results for input(s): "AMMONIA" in the last 168 hours. CBC: Recent Labs  Lab 06/14/23 0400 06/14/23 0424  WBC 12.8*  --   NEUTROABS 11.6*  --   HGB 10.0* 10.9*  HCT 32.6* 32.0*  MCV 89.6  --   PLT 122*  --    Cardiac Enzymes: No results for input(s): "CKTOTAL", "CKMB", "CKMBINDEX", "TROPONINI" in the last 168 hours. CBG: Recent Labs  Lab 06/14/23 1206 06/14/23 1226 06/14/23 1228 06/14/23 1230 06/14/23 1243  GLUCAP 41* <10* 19* 13* 16*   Iron Studies: No results for input(s): "IRON", "TIBC", "TRANSFERRIN", "FERRITIN" in the last 72 hours. Studies/Results: DG Chest Port 1 View Result Date: 06/14/2023 CLINICAL DATA:  Weakness EXAM: PORTABLE CHEST 1 VIEW COMPARISON:  04/11/2023 FINDINGS: Patchy left lower lobe/retrocardiac opacity, chronic and possibly reflecting scarring/atelectasis, although left lower lobe pneumonia is not excluded. Small bilateral pleural effusions, new. No frank interstitial edema. The heart is normal in size. Thoracic aortic atherosclerosis. Right IJ dual lumen dialysis catheter terminating in the upper right atrium. IMPRESSION: Patchy left lower lobe/retrocardiac opacity, chronic and possibly reflecting scarring/atelectasis, although left lower lobe pneumonia is not excluded. Small bilateral pleural effusions, new. Electronically Signed   By: Zadie Herter M.D.   On: 06/14/2023  02:33   IR NEPHROSTOMY EXCHANGE LEFT Result Date: 06/13/2023 INDICATION: 88 year old presents for routine nephrostomy exchange EXAM: IR EXCHANGE NEPHROSTOMY LEFT COMPARISON:  Multiple prior MEDICATIONS: None ANESTHESIA/SEDATION: None CONTRAST:  10mL OMNIPAQUE  IOHEXOL  300 MG/ML SOLN - administered into the collecting system(s) FLUOROSCOPY TIME:  Fluoroscopy Time:  (6.4 mGy). COMPLICATIONS: None PROCEDURE: Informed written consent was obtained from the patient after a thorough discussion of the procedural risks, benefits and alternatives. All questions were  addressed. Maximal Sterile Barrier Technique was utilized including caps, mask, sterile gowns, sterile gloves, sterile drape, hand hygiene and skin antiseptic. A timeout was performed prior to the initiation of the procedure. Left: 1% lidocaine  was used for local anesthesia. Small amount of contrast was infused confirming location in the collecting system. Modified Seldinger technique was then used to exchange for a new 10 [place] percutaneous nephrostomy. Catheter was formed in the collecting system and contrast confirmed location. Catheter was sutured in location and attached to gravity drainage. Patient tolerated the procedure well and remained hemodynamically stable throughout. No complications were encountered and no significant blood loss. IMPRESSION: Status post routine exchange left-sided PCN. Signed, Marciano Settles. Rexine Cater, RPVI [facility] Electronically Signed   By: Myrlene Asper D.O.   On: 06/13/2023 08:08    ROS: As per HPI otherwise negative.  Physical Exam: Vitals:   06/14/23 0915 06/14/23 0945 06/14/23 1030 06/14/23 1218  BP:   103/68 116/84  Pulse: 90   (!) 47  Resp: (!) 27  (!) 22 19  Temp:  (!) 97.3 F (36.3 C) (!) 97.4 F (36.3 C) 97.6 F (36.4 C)  TempSrc:  Axillary Rectal Axillary  SpO2: 95%   97%  Weight:      Height:         General: Chronically ill appearing  very elderly female in no acute distress. Head: Normocephalic, atraumatic, sclera non-icteric, mucus membranes are moist Neck: Supple. JVD 1/4 to mandible Lungs: Bilateral breath sounds bibasilar crackles, few scattered upper airway rhonchi. No WOB. Wet cough.  Heart: S1,S2 irreg, irreg no M/R/G . Abdomen: Soft, non-tender, non-distended with normoactive bowel sounds. No rebound/guarding. No obvious abdominal masses. Lower extremities:2+ pitting edema and erythema BLE R>L. Dressing on upper extremities.  Neuro: Alert and oriented X 2 Moves all extremities spontaneously. Psych:  Responds to questions appropriately with a normal affect. Dialysis Access: RIJ TDC drsg intact.   Dialysis Orders: Center: [facility] MWF 3.5 hrs 160NRe 350/Autoflow 1.5 49.7 kg 2.0 K/2.0 Ca TDC - no heparin  - Hectorol  1 mcg IV three times per week  Assessment/Plan:  Volume overload: DID have HD 06/13/2023 but forgot to take midodrine . Unable to remove volume DT hypotension and pt left above OP EDW. Order extra treatment today. Very low albumin , third spacing fluid.  Possible Cellulitis: Per primary Possible CAP: per primary Hypoglycemia-BS 278 with blood from earlobe. Per primary Hypokalemia-KCL 10 meq IV over an hour X 2. Use 4.0 K bath.   ESRD -  MWF. Extra treatment today. Next HD 06/16/2023  Hypertension/volume  - Increase midodrine  to 10 mg PO TID. Midodrine  10 mg PO prior to HD on MWF. See #1.   Anemia  - HGB in 10 range. No ESA at OP clinic. Follow labs  Metabolic bone disease -  continue VDRA, binders. Check PO4.   Nutrition - Albumin  1.9. protein supplements  Afib-no anticoagulant. Rate is controlled.   Debility and decline: Very elderly patient with multiple comorbid conditions. She is a full code. Will consult Palliative Care for GOC.    Donique Hammonds H. Bevin Bucks, NP-C 06/14/2023, 1:00 PM  [employer] [phone]

## 2023-06-14 NOTE — ED Notes (Signed)
 CCMD called and patient placed on monitor

## 2023-06-14 NOTE — ED Provider Notes (Signed)
 [place] [facility]ERGENC[facility] DEPARTMENT AT [facility] Provider Note   CSN: [id number] Arrival date & time: 06/14/23  4098     History  Chief Complaint  Patient presents with   Hypotension    Kim Burns is a 88 y.o. female.  The history is provided by the patient and a relative.  [name] is a 88 y.o. female who presents to the Emergency Department complaining of difficulty breathing.  She presents to the emergency department for evaluation of difficulty breathing and wet cough that started this afternoon.  She has a history of ESRD and is on hemodialysis once Monday, Wednesday, Friday.  She also has issues with hypotension and takes midodrine  prior to dialysis.  Today she dropped her pill and was unable to take it prior to her session.  When she was at dialysis they did contact family due to concern for low blood pressure and they were able to give her her home midodrine  and continue but did not complete the session.  Family also report that they were concerned for infection in her arm.  Family reports purple color change and swelling to the left arm that has been present for 3 weeks.  No fever.  No chest pain.  She does have a chronic cough that is typically dry but this is a significant change for her referred to sound wet.  She did have a nephrostomy tube exchange on Thursday of this week and did sustain some skin tears to the left arm at that time.      Home Medications Prior to Admission medications   Medication Sig Start Date End Date Taking? Authorizing Provider  amiodarone  (PACERONE ) 200 MG tablet Take 1 tablet (200 mg total) by mouth daily. 11/15/22  Yes Love, Renay Carota, PA-C  aspirin  81 MG chewable tablet Chew 1 tablet (81 mg total) by mouth daily. 11/13/22  Yes Love, Renay Carota, PA-C  B Complex-C-Zn-Folic Acid  (DIALYVITE 800 WITH ZINC ) 0.8 MG TABS Take 1 tablet by mouth in the morning.   Yes [provider]  Carboxymethylcell-Glycerin  PF (REFRESH TEARS PF)  0.5-0.9 % SOLN Place 1 drop into both eyes at bedtime.   Yes [provider]  diphenhydramine -acetaminophen  (TYLENOL  PM) 25-500 MG TABS tablet Take 2 tablets by mouth at bedtime.   Yes [provider]  Lifitegrast  5 % SOLN Place 1 drop into both eyes daily as needed (for dryness). 03/10/23  Yes [provider]  lipase/protease/amylase (CREON ) 36000 UNITS CPEP capsule Take 36,000 Units by mouth 2 (two) times daily after a meal.   Yes [provider]  meclizine  (ANTIVERT ) 25 MG tablet Take 1 tablet (25 mg total) by mouth 3 (three) times daily as needed for dizziness. 01/02/23 01/02/24 Yes Althia Atlas, MD  metoprolol  tartrate (LOPRESSOR ) 25 MG tablet Take 1 tablet (25 mg total) by mouth 2 (two) times daily. 04/18/23  Yes Ghimire, Estil Heman, MD  midodrine  (PROAMATINE ) 10 MG tablet Take 0.5 tablets (5 mg total) by mouth 3 (three) times daily. Take 10mg  by mouth before dialysis. Bring with you to dialysis in case another dose is required. May take 10 mg 3 times a day if systolic BP less than 120 mmHg 04/18/23 04/17/24 Yes Ghimire, Estil Heman, MD  pantoprazole  (PROTONIX ) 40 MG tablet Take 40 mg by mouth daily.   Yes [provider]  rOPINIRole  (REQUIP ) 0.25 MG tablet Take 3 tablets (0.75 mg total) by mouth at bedtime. 04/18/23  Yes Ghimire, Estil Heman, MD  sevelamer   carbonate (RENVELA ) 800 MG tablet Take 1 tablet (800 mg total) by mouth See admin instructions. Take 800mg  by mouth with meals or snacks. Patient taking differently: Take 800 mg by mouth 2 (two) times daily after a meal. 04/18/23  Yes Ghimire, Estil Heman, MD  traMADol  (ULTRAM ) 50 MG tablet Take 50 mg by mouth every 6 (six) hours as needed for moderate pain (pain score 4-6).   Yes [provider]  White Petrolatum-Mineral Oil (SYSTANE NIGHTTIME) OINT Apply 1 Application to eye at bedtime as needed (severe dry eyes).   Yes [provider]  benzonatate  (TESSALON ) 200 MG capsule Take 200 mg by  mouth 3 (three) times daily as needed for cough. Patient not taking: Reported on 06/14/2023 04/08/23   [provider]  lidocaine -prilocaine  (EMLA ) cream Apply 1 Application topically every Monday, Wednesday, and Friday with hemodialysis. Patient not taking: Reported on 06/10/2023    [provider]      Allergies    Lopid [gemfibrozil], Lotemax [loteprednol etabonate], Statins, Other, Vibra -tab [doxycycline ], Amlodipine, Codeine, Hydrocodone , Lisinopril, and Verapamil     Review of Systems   Review of Systems  All other systems reviewed and are negative.   Physical Exam Updated Vital Signs BP 97/85   Pulse (!) 55   Temp (!) 97.2 F (36.2 C) (Axillary)   Resp (!) 22   Ht 5\' 3"  (1.6 m)   Wt 53.5 kg   SpO2 92%   BMI 20.89 kg/m  Physical Exam Vitals and nursing note reviewed.  Constitutional:      Appearance: She is well-developed.     Comments: Drowsy but awakens to verbal stimuli.  HENT:     Head: Normocephalic and atraumatic.  Cardiovascular:     Rate and Rhythm: Regular rhythm. Tachycardia present.     Heart sounds: No murmur heard. Pulmonary:     Effort: Pulmonary effort is normal. No respiratory distress.     Comments: Decreased air movement in right lung base, crackles in the left lung base. Abdominal:     Palpations: Abdomen is soft.     Tenderness: There is no abdominal tenderness. There is no guarding or rebound.  Musculoskeletal:     Comments: Edema to all four extremities. Ecchymosis to arms, legs.  Ulcer to the right second toe, left second toe.   Skin:    General: Skin is warm and dry.  Neurological:     Comments: Oriented to person, recent events in place.  Global weakness.  Psychiatric:        Behavior: Behavior normal.     ED Results / Procedures / Treatments   Labs (all labs ordered are listed, but only abnormal results are displayed) Labs Reviewed  COMPREHENSIVE METABOLIC PANEL WITH GFR - Abnormal; Notable for the following  components:      Result Value   Potassium 2.9 (*)    Chloride 97 (*)    Glucose, Bld 122 (*)    Creatinine, Ser 2.14 (*)    Calcium  8.0 (*)    Total Protein 4.9 (*)    Albumin  1.9 (*)    GFR, Estimated 22 (*)    All other components within normal limits  CBC WITH DIFFERENTIAL/PLATELET - Abnormal; Notable for the following components:   WBC 12.8 (*)    RBC 3.64 (*)    Hemoglobin 10.0 (*)    HCT 32.6 (*)    RDW 20.6 (*)    Platelets 122 (*)    Neutro Abs 11.6 (*)    Abs  Immature Granulocytes 0.08 (*)    All other components within normal limits  PROTIME-INR - Abnormal; Notable for the following components:   Prothrombin  Time 16.2 (*)    INR 1.3 (*)    All other components within normal limits  BRAIN NATRIURETIC PEPTIDE - Abnormal; Notable for the following components:   B Natriuretic Peptide 2,475.2 (*)    All other components within normal limits  I-STAT CHEM 8, ED - Abnormal; Notable for the following components:   Potassium 3.2 (*)    Chloride 95 (*)    Creatinine, Ser 2.30 (*)    Glucose, Bld 112 (*)    Calcium , Ion 1.01 (*)    TCO2 33 (*)    Hemoglobin 10.9 (*)    HCT 32.0 (*)    All other components within normal limits  TROPONIN I (HIGH SENSITIVITY) - Abnormal; Notable for the following components:   Troponin I (High Sensitivity) 29 (*)    All other components within normal limits  CULTURE, BLOOD (ROUTINE X 2)  CULTURE, BLOOD (ROUTINE X 2)  I-STAT CG4 LACTIC ACID, ED  I-STAT CG4 LACTIC ACID, ED    EKG EKG Interpretation Date/Time:  Saturday June 14 2023 04:24:01 EDT Ventricular Rate:  91 PR Interval:    QRS Duration:  104 QT Interval:  423 QTC Calculation: 521 R Axis:   24  Text Interpretation: Atrial fibrillation Probable anterior infarct, age indeterminate Prolonged QT interval Confirmed by Kelsey Patricia [phone]) on 06/14/2023 5:06:19 AM  Radiology DG Chest Port 1 View Result Date: 06/14/2023 CLINICAL DATA:  Weakness EXAM: PORTABLE CHEST 1 VIEW  COMPARISON:  04/11/2023 FINDINGS: Patchy left lower lobe/retrocardiac opacity, chronic and possibly reflecting scarring/atelectasis, although left lower lobe pneumonia is not excluded. Small bilateral pleural effusions, new. No frank interstitial edema. The heart is normal in size. Thoracic aortic atherosclerosis. Right IJ dual lumen dialysis catheter terminating in the upper right atrium. IMPRESSION: Patchy left lower lobe/retrocardiac opacity, chronic and possibly reflecting scarring/atelectasis, although left lower lobe pneumonia is not excluded. Small bilateral pleural effusions, new. Electronically Signed   By: Zadie Herter M.D.   On: 06/14/2023 02:33   IR NEPHROSTOMY EXCHANGE LEFT Result Date: 06/13/2023 INDICATION: 88 year old presents for routine nephrostomy exchange EXAM: IR EXCHANGE NEPHROSTOMY LEFT COMPARISON:  Multiple prior MEDICATIONS: None ANESTHESIA/SEDATION: None CONTRAST:  10mL OMNIPAQUE  IOHEXOL  300 MG/ML SOLN - administered into the collecting system(s) FLUOROSCOPY TIME:  Fluoroscopy Time:  (6.4 mGy). COMPLICATIONS: None PROCEDURE: Informed written consent was obtained from the patient after a thorough discussion of the procedural risks, benefits and alternatives. All questions were addressed. Maximal Sterile Barrier Technique was utilized including caps, mask, sterile gowns, sterile gloves, sterile drape, hand hygiene and skin antiseptic. A timeout was performed prior to the initiation of the procedure. Left: 1% lidocaine  was used for local anesthesia. Small amount of contrast was infused confirming location in the collecting system. Modified Seldinger technique was then used to exchange for a new 10 [place] percutaneous nephrostomy. Catheter was formed in the collecting system and contrast confirmed location. Catheter was sutured in location and attached to gravity drainage. Patient tolerated the procedure well and remained hemodynamically stable throughout. No complications were  encountered and no significant blood loss. IMPRESSION: Status post routine exchange left-sided PCN. Signed, Marciano Settles. Rexine Cater, RPVI [facility] Radiology Electronically Signed   By: Myrlene Asper D.O.   On: 06/13/2023 08:08    Procedures Procedures    Medications Ordered in ED Medications  cefTRIAXone  (ROCEPHIN )  1 g in sodium chloride  0.9 % 100 mL IVPB (1 g Intravenous New Bag/Given 06/14/23 0415)    ED Course/ Medical Decision Making/ A&P                                 Medical Decision Making Amount and/or Complexity of Data Reviewed Labs: ordered. Radiology: ordered.   Patient with history of ESRD on hemodialysis, atrial fibrillation not on anticoagulation, CHF, diabetes here for evaluation of shortness of breath and cough.  She is ill-appearing on examination.  She does have ecchymosis of all 4 extremities, son states the ecchymosis to the left upper extremity is new over the last 3 weeks.  On examination she is volume overloaded.  She does have crackles on lung exam, chest x-ray demonstrates possible infiltrate.  Concern for pneumonia will start on antibiotics.  She was given ceftriaxone  and doxycycline .  Azithromycin was not given due to prolonged QT on EKG as well as her renal disease.  CBC with mild leukocytosis.  BNP is stable when compared to priors.  Troponin is minimally elevated, decreased when compared to priors.  CMP with mild hypokalemia, given her ESRD will not replace at this time.  She does have ulcerations to bilateral feet, in general she has poor perfusion.  Ulcers do not appear to be acutely infected at this time.  Discussed with son at the bedside regarding goals of care.  He states that she would not want life support or mechanical ventilation but she would want CPR for a brief trial.  Medicine consulted for admission for ongoing care.        Final Clinical Impression(s) / ED Diagnoses Final diagnoses:   None    Rx / DC Orders ED Discharge Orders     None         Kelsey Patricia, MD 06/14/23 [phone]

## 2023-06-14 NOTE — ED Notes (Signed)
 ABT started late due to IV maintenance.

## 2023-06-14 NOTE — ED Notes (Signed)
 Patient son is requesting that his mom gets the IV team to start IV on her

## 2023-06-14 NOTE — Progress Notes (Signed)
HD Start °

## 2023-06-14 NOTE — Consult Note (Signed)
 NAME:  Kim Burns, MRN:  [id number], DOB:  [birth year], LOS: 0 ADMISSION DATE:  06/14/2023, CONSULTATION DATE: 06/14/2023 REFERRING MD: Dr. Felipe Horton, CHIEF COMPLAINT: Altered mental status  History of Present Illness:  88 year old female with end-stage renal disease on hemodialysis, diabetes type 2, chronic HFpEF who underwent hemodialysis yesterday but they could not remove volume as patient did not take her midodrine  and her blood pressure was borderline.  She presented with cough and shortness of breath.  Patient blood glucose was checked via fingerstick it was noted to be low she was started on D10 infusion, with repeated check blood sugar remained low she was given D50 x 2.  PCCM was consulted for help evaluation During my evaluation patient is awake, interactive and conversant.  Blood sugar was checked from earlobe it was 278.  She is complaining of cough but stated breathing is better, denies dizziness, fever or chills  Pertinent  Medical History   Past Medical History:  Diagnosis Date   Anemia of chronic disease    takes iron   Anxiety    Atrial fibrillation (HCC)    Blood transfusion without reported diagnosis    Bradycardia    Diabetes mellitus without complication (HCC)    became diabetic after Whipple procedure   Diarrhea    Dysrhythmia 04/16/2016   bradycardia due to medication    ESRD on hemodialysis [facility])    M-W-F   GERD (gastroesophageal reflux disease)    Gout    Headache    History of kidney stones 06/2013   Hyperparathyroidism (HCC)    Hypertension    PONV (postoperative nausea and vomiting)    Sleep apnea    no cpap machine. could not tolerate   Stroke (HCC) 04/16/2016   TIA 1995   Vitamin D  deficiency      Significant Hospital Events: Including procedures, antibiotic start and stop dates in addition to other pertinent events     Interim History / Subjective:  As above  Objective   Blood pressure 116/84, pulse (!) 47, temperature 97.6 F (36.4  C), temperature source Axillary, resp. rate 19, height 5\' 3"  (1.6 m), weight 53.5 kg, SpO2 97%.       No intake or output data in the 24 hours ending 06/14/23 1341 Filed Weights   06/14/23 0132  Weight: 53.5 kg    Examination: Physical exam: General: Chronically ill-appearing female, lying on the bed HEENT: [place]/AT, eyes anicteric.  Dry mucus membranes Neuro: Alert, awake following commands, weak cough and gag Chest: Bilateral rhonchorous breath sounds, no wheezes or rhonchi Heart: Irregularly irregular, no murmurs or gallops Abdomen: Soft, nontender, nondistended, bowel sounds present Skin: Multiple bruise marks noted all over the body, especially on bilateral upper extremities   Resolved Hospital Problem list     Assessment & Plan:  End-stage renal disease on hemodialysis, currently volume overload Chronic hypotension on midodrine  Pseudohypoglycemia in the setting of poor circulation in peripheries Persistent atrial fibrillation  Nephrology is consulting, they recommend dialyzing her today Continue midodrine  Glucose was checked from earlobe, came back 278 Stopped D10 infusion considering patient is volume overloaded Check blood sugar from earlobe and not through peripheral finger as she has poor vasculature considering she has end-stage renal disease and blood sugar check on finger may not be accurate.  At this time PCCM will sign off, call with questions  Best Practice (right click and "Reselect all SmartList Selections" daily)  Per primary team  Labs   CBC: Recent Labs  Lab 06/14/23  0400 06/14/23 0424  WBC 12.8*  --   NEUTROABS 11.6*  --   HGB 10.0* 10.9*  HCT 32.6* 32.0*  MCV 89.6  --   PLT 122*  --     Basic Metabolic Panel: Recent Labs  Lab 06/14/23 0400 06/14/23 0424  NA 140 138  K 2.9* 3.2*  CL 97* 95*  CO2 30  --   GLUCOSE 122* 112*  BUN 15 20  CREATININE 2.14* 2.30*  CALCIUM  8.0*  --    GFR: Estimated Creatinine Clearance: 14.8 mL/min (A)  (by C-G formula based on SCr of 2.3 mg/dL (H)). Recent Labs  Lab 06/14/23 0400 06/14/23 0424  PROCALCITON 0.44  --   WBC 12.8*  --   LATICACIDVEN  --  1.9    Liver Function Tests: Recent Labs  Lab 06/14/23 0400  AST 18  ALT 23  ALKPHOS 84  BILITOT 0.9  PROT 4.9*  ALBUMIN  1.9*   No results for input(s): "LIPASE", "AMYLASE" in the last 168 hours. No results for input(s): "AMMONIA" in the last 168 hours.  ABG    Component Value Date/Time   PHART 7.42 06/14/2023 1213   PCO2ART 46 06/14/2023 1213   PO2ART 117 (H) 06/14/2023 1213   HCO3 29.8 (H) 06/14/2023 1213   TCO2 33 (H) 06/14/2023 0424   ACIDBASEDEF 7.0 (H) 01/22/2022 1257   O2SAT 97.4 06/14/2023 1213     Coagulation Profile: Recent Labs  Lab 06/14/23 0400  INR 1.3*    Cardiac Enzymes: No results for input(s): "CKTOTAL", "CKMB", "CKMBINDEX", "TROPONINI" in the last 168 hours.  HbA1C: Hgb A1c MFr Bld  Date/Time Value Ref Range Status  03/27/2023 06:22 AM 5.4 4.8 - 5.6 % Final    Comment:    (NOTE) Pre diabetes:          5.7%-6.4%  Diabetes:              >6.4%  Glycemic control for   <7.0% adults with diabetes   10/27/2022 10:45 AM 6.1 (H) 4.8 - 5.6 % Final    Comment:    (NOTE) Pre diabetes:          5.7%-6.4%  Diabetes:              >6.4%  Glycemic control for   <7.0% adults with diabetes     CBG: Recent Labs  Lab 06/14/23 1226 06/14/23 1228 06/14/23 1230 06/14/23 1243 06/14/23 1326  GLUCAP <10* 19* 13* 16* 278*    Review of Systems:   12 point review of systems significant for complaint mentioned in HPI, rest negative  Past Medical History:  She,  has a past medical history of Anemia of chronic disease, Anxiety, Atrial fibrillation (HCC), Blood transfusion without reported diagnosis, Bradycardia, Diabetes mellitus without complication (HCC), Diarrhea, Dysrhythmia (04/16/2016), ESRD on hemodialysis (HCC), GERD (gastroesophageal reflux disease), Gout, Headache, History of kidney  stones (06/2013), Hyperparathyroidism (HCC), Hypertension, PONV (postoperative nausea and vomiting), Sleep apnea, Stroke (HCC) (04/16/2016), and Vitamin D  deficiency.   Surgical History:   Past Surgical History:  Procedure Laterality Date   A/V FISTULAGRAM Left 08/30/2021   Procedure: A/V Fistulagram;  Surgeon: Young Hensen, MD;  Location: [facility] INVASIVE CV LAB;  Service: Cardiovascular;  Laterality: Left;   A/V FISTULAGRAM N/A 02/25/2023   Procedure: A/V Fistulagram;  Surgeon: Adine Hoof, MD;  Location: [facility] INVASIVE CV LAB;  Service: Vascular;  Laterality: N/A;   A/V FISTULAGRAM N/A 05/27/2023   Procedure: A/V Fistulagram;  Surgeon: Margherita Shell, MD;  Location: [facility] INVASIVE CV LAB;  Service: Cardiovascular;  Laterality: N/A;   A/V SHUNT INTERVENTION N/A 05/27/2023   Procedure: A/V SHUNT INTERVENTION;  Surgeon: Margherita Shell, MD;  Location: [facility] INVASIVE CV LAB;  Service: Cardiovascular;  Laterality: N/A;   ABDOMINAL HYSTERECTOMY  1985   complete   BACK SURGERY  1980   lower   BASCILIC VEIN TRANSPOSITION Left 04/24/2017   Procedure: LEFT ARM FIRST STAGE BASILIC VEIN TRANSPOSITION;  Surgeon: Margherita Shell, MD;  Location: [facility] OR;  Service: Vascular;  Laterality: Left;   BASCILIC VEIN TRANSPOSITION Left 07/10/2017   Procedure: SECOND STAGE BASILIC VEIN TRANSPOSITION LEFT ARM;  Surgeon: Margherita Shell, MD;  Location: [facility] OR;  Service: Vascular;  Laterality: Left;   BREAST LUMPECTOMY Left x 2   many years apart, benign   CHOLECYSTECTOMY  1985   CYSTOSCOPY W/ URETERAL STENT PLACEMENT Left 10/31/2021   Procedure: CYSTOSCOPY WITH RETROGRADE PYELOGRAM/URETERAL STENT PLACEMENT;  Surgeon: Andrez Banker, MD;  Location: [facility] OR;  Service: Urology;  Laterality: Left;   CYSTOSCOPY WITH URETEROSCOPY AND STENT PLACEMENT Left 06/18/2013   Procedure: CYSTOSCOPY WITH Lef URETEROSCOPY AND Left STENT PLACEMENT;  Surgeon: Kristeen Peto, MD;  Location: [facility] ORS;  Service: Urology;  Laterality:  Left;   DIALYSIS/PERMA CATHETER INSERTION N/A 03/13/2023   Procedure: DIALYSIS/PERMA CATHETER INSERTION;  Surgeon: Melodie Spry, MD;  Location: [facility] INVASIVE CV LAB;  Service: Cardiovascular;  Laterality: N/A;   ESOPHAGOGASTRODUODENOSCOPY (EGD) WITH PROPOFOL  N/A 11/13/2016   Procedure: ESOPHAGOGASTRODUODENOSCOPY (EGD) WITH PROPOFOL ;  Surgeon: Felecia Hopper, MD;  Location: [facility] ENDOSCOPY;  Service: Gastroenterology;  Laterality: N/A;   EUS N/A 02/07/2016   Procedure: ESOPHAGEAL ENDOSCOPIC ULTRASOUND (EUS) RADIAL;  Surgeon: Evangeline Hilts, MD;  Location: [facility] ENDOSCOPY;  Service: Endoscopy;  Laterality: N/A;   EYE SURGERY Bilateral 2014   ioc for catracts    FOOT SURGERY Left 1990   something with toes unsure what    HERNIA REPAIR  2018   IR CATHETER TUBE CHANGE  03/14/2022   IR EMBO TUMOR ORGAN ISCHEMIA INFARCT INC GUIDE ROADMAPPING  08/22/2021   IR NEPHROSTOMY EXCHANGE LEFT  06/27/2022   IR NEPHROSTOMY EXCHANGE LEFT  08/26/2022   IR NEPHROSTOMY EXCHANGE LEFT  10/22/2022   IR NEPHROSTOMY EXCHANGE LEFT  12/16/2022   IR NEPHROSTOMY EXCHANGE LEFT  12/30/2022   IR NEPHROSTOMY EXCHANGE LEFT  02/10/2023   IR NEPHROSTOMY EXCHANGE LEFT  04/07/2023   IR NEPHROSTOMY EXCHANGE LEFT  06/12/2023   IR NEPHROSTOMY PLACEMENT LEFT  03/14/2022   IR NEPHROSTOMY PLACEMENT LEFT  05/01/2023   IR RADIOLOGIST EVAL & MGMT  05/18/2021   IR RADIOLOGIST EVAL & MGMT  10/01/2021   IR RADIOLOGIST EVAL & MGMT  11/12/2021   IR RADIOLOGIST EVAL & MGMT  11/22/2021   IR RADIOLOGIST EVAL & MGMT  11/27/2021   IR RADIOLOGIST EVAL & MGMT  12/20/2021   IR RENAL SUPRASEL UNI S&I MOD SED  08/22/2021   IR US  GUIDE VASC ACCESS RIGHT  08/22/2021   LEFT HEART CATH AND CORONARY ANGIOGRAPHY N/A 08/27/2021   Procedure: LEFT HEART CATH AND CORONARY ANGIOGRAPHY;  Surgeon: Odie Benne, MD;  Location: [facility] INVASIVE CV LAB;  Service: Cardiovascular;  Laterality: N/A;   PARATHYROID  EXPLORATION     PERIPHERAL VASCULAR BALLOON  ANGIOPLASTY Left 08/30/2021   Procedure: PERIPHERAL VASCULAR BALLOON ANGIOPLASTY;  Surgeon: Young Hensen, MD;  Location: [facility] INVASIVE CV LAB;  Service: Cardiovascular;  Laterality: Left;  arm fistula   PERIPHERAL VASCULAR BALLOON ANGIOPLASTY Left 02/25/2023  Procedure: PERIPHERAL VASCULAR BALLOON ANGIOPLASTY;  Surgeon: Adine Hoof, MD;  Location: [facility] INVASIVE CV LAB;  Service: Vascular;  Laterality: Left;  left AVF   RADIOLOGY WITH ANESTHESIA Left 08/22/2021   Procedure: MICROWAVE ABLATION;  Surgeon: Robbi Childs, MD;  Location: [facility] ORS;  Service: Anesthesiology;  Laterality: Left;   WHIPPLE PROCEDURE N/A 04/23/2016   Procedure: WHIPPLE PROCEDURE;  Surgeon: Lockie Rima, MD;  Location: [facility] OR;  Service: General;  Laterality: N/A;     Social History:   reports that she has never smoked. She has never used smokeless tobacco. She reports that she does not drink alcohol  and does not use drugs.   Family History:  Her family history includes Alzheimer's disease in her sister and sister; Cancer in her brother and father; Heart disease in her mother; Stroke in her mother. There is no history of Breast cancer.   Allergies Allergies  Allergen Reactions   Lopid [Gemfibrozil] Other (See Comments)    Myalgia    Lotemax [Loteprednol Etabonate] Rash    Skin burning  Blurry vision   Statins Other (See Comments)    Rosuvastatin Muscle weakness   Other Diarrhea    Real Butter - diarrhea; patient still uses butter   Vibra -Tab [Doxycycline ] Other (See Comments)    Unknown reaction   Amlodipine Other (See Comments)    Norvasc   Codeine Other (See Comments)    headache   Hydrocodone      Headache    Lisinopril Other (See Comments)   Verapamil  Other (See Comments)     Home Medications  Prior to Admission medications   Medication Sig Start Date End Date Taking? Authorizing Provider  amiodarone  (PACERONE ) 200 MG tablet Take 1 tablet (200 mg total) by mouth daily. 11/15/22  Yes Love,  Renay Carota, PA-C  aspirin  81 MG chewable tablet Chew 1 tablet (81 mg total) by mouth daily. 11/13/22  Yes Love, Renay Carota, PA-C  B Complex-C-Zn-Folic Acid  (DIALYVITE 800 WITH ZINC ) 0.8 MG TABS Take 1 tablet by mouth in the morning.   Yes [provider]  Carboxymethylcell-Glycerin  PF (REFRESH TEARS PF) 0.5-0.9 % SOLN Place 1 drop into both eyes at bedtime.   Yes [provider]  diphenhydramine -acetaminophen  (TYLENOL  PM) 25-500 MG TABS tablet Take 2 tablets by mouth at bedtime.   Yes [provider]  Lifitegrast  5 % SOLN Place 1 drop into both eyes daily as needed (for dryness). 03/10/23  Yes [provider]  lipase/protease/amylase (CREON ) 36000 UNITS CPEP capsule Take 36,000 Units by mouth 2 (two) times daily after a meal.   Yes [provider]  meclizine  (ANTIVERT ) 25 MG tablet Take 1 tablet (25 mg total) by mouth 3 (three) times daily as needed for dizziness. 01/02/23 01/02/24 Yes Althia Atlas, MD  metoprolol  tartrate (LOPRESSOR ) 25 MG tablet Take 1 tablet (25 mg total) by mouth 2 (two) times daily. 04/18/23  Yes Ghimire, Estil Heman, MD  midodrine  (PROAMATINE ) 10 MG tablet Take 0.5 tablets (5 mg total) by mouth 3 (three) times daily. Take 10mg  by mouth before dialysis. Bring with you to dialysis in case another dose is required. May take 10 mg 3 times a day if systolic BP less than 120 mmHg 04/18/23 04/17/24 Yes Ghimire, Estil Heman, MD  pantoprazole  (PROTONIX ) 40 MG tablet Take 40 mg by mouth daily.   Yes [provider]  rOPINIRole  (REQUIP ) 0.25 MG tablet Take 3 tablets (0.75 mg total) by mouth at bedtime. 04/18/23  Yes Ghimire, Estil Heman, MD  SENSIPAR   30 MG tablet Take 30 mg by mouth every Monday, Wednesday, and Friday. 05/26/23  Yes [provider]  sevelamer  carbonate (RENVELA ) 800 MG tablet Take 1 tablet (800 mg total) by mouth See admin instructions. Take 800mg  by mouth with meals or snacks. Patient taking differently: Take 800 mg by mouth 2  (two) times daily after a meal. 04/18/23  Yes Ghimire, Estil Heman, MD  traMADol  (ULTRAM ) 50 MG tablet Take 50 mg by mouth every 6 (six) hours as needed for moderate pain (pain score 4-6).   Yes [provider]  White Petrolatum-Mineral Oil (SYSTANE NIGHTTIME) OINT Apply 1 Application to eye at bedtime as needed (severe dry eyes).   Yes [provider]  benzonatate  (TESSALON ) 200 MG capsule Take 200 mg by mouth 3 (three) times daily as needed for cough. Patient not taking: Reported on 06/14/2023 04/08/23   [provider]  lidocaine -prilocaine  (EMLA ) cream Apply 1 Application topically every Monday, Wednesday, and Friday with hemodialysis. Patient not taking: Reported on 06/10/2023    [provider]     Trevor Fudge, MD [facility] See Amion for pager If no response to pager, please call [phone] until 7pm After 7pm, Please call E-link [phone]

## 2023-06-14 NOTE — ED Notes (Addendum)
 Patient has thin fragile skin, multiple scattered skin tears, wounds to toes and feet, Creasie Doctor, MD at bedside.

## 2023-06-15 ENCOUNTER — Inpatient Hospital Stay (HOSPITAL_COMMUNITY)

## 2023-06-15 DIAGNOSIS — Z515 Encounter for palliative care: Secondary | ICD-10-CM | POA: Diagnosis not present

## 2023-06-15 DIAGNOSIS — L0390 Cellulitis, unspecified: Secondary | ICD-10-CM | POA: Diagnosis not present

## 2023-06-15 DIAGNOSIS — Z7189 Other specified counseling: Secondary | ICD-10-CM

## 2023-06-15 DIAGNOSIS — J189 Pneumonia, unspecified organism: Secondary | ICD-10-CM | POA: Diagnosis not present

## 2023-06-15 DIAGNOSIS — Z992 Dependence on renal dialysis: Secondary | ICD-10-CM | POA: Diagnosis not present

## 2023-06-15 DIAGNOSIS — I480 Paroxysmal atrial fibrillation: Secondary | ICD-10-CM

## 2023-06-15 DIAGNOSIS — E43 Unspecified severe protein-calorie malnutrition: Secondary | ICD-10-CM | POA: Diagnosis not present

## 2023-06-15 DIAGNOSIS — N186 End stage renal disease: Secondary | ICD-10-CM | POA: Diagnosis not present

## 2023-06-15 LAB — CBC
HCT: 30.5 % — ABNORMAL LOW (ref 36.0–46.0)
Hemoglobin: 9.3 g/dL — ABNORMAL LOW (ref 12.0–15.0)
MCH: 27.3 pg (ref 26.0–34.0)
MCHC: 30.5 g/dL (ref 30.0–36.0)
MCV: 89.4 fL (ref 80.0–100.0)
Platelets: 87 10*3/uL — ABNORMAL LOW (ref 150–400)
RBC: 3.41 MIL/uL — ABNORMAL LOW (ref 3.87–5.11)
RDW: 20.5 % — ABNORMAL HIGH (ref 11.5–15.5)
WBC: 12.4 10*3/uL — ABNORMAL HIGH (ref 4.0–10.5)
nRBC: 0.3 % — ABNORMAL HIGH (ref 0.0–0.2)

## 2023-06-15 LAB — GLUCOSE, CAPILLARY
Glucose-Capillary: 87 mg/dL (ref 70–99)
Glucose-Capillary: 94 mg/dL (ref 70–99)
Glucose-Capillary: 89 mg/dL (ref 70–99)

## 2023-06-15 LAB — RENAL FUNCTION PANEL
Albumin: 2.1 g/dL — ABNORMAL LOW (ref 3.5–5.0)
Anion gap: 14 (ref 5–15)
BUN: 11 mg/dL (ref 8–23)
CO2: 23 mmol/L (ref 22–32)
Calcium: 7.9 mg/dL — ABNORMAL LOW (ref 8.9–10.3)
Chloride: 98 mmol/L (ref 98–111)
Creatinine, Ser: 1.62 mg/dL — ABNORMAL HIGH (ref 0.44–1.00)
GFR, Estimated: 31 mL/min — ABNORMAL LOW (ref >60)
Glucose, Bld: 68 mg/dL — ABNORMAL LOW (ref 70–99)
Phosphorus: 3.6 mg/dL (ref 2.5–4.6)
Potassium: 3.7 mmol/L (ref 3.5–5.1)
Sodium: 135 mmol/L (ref 135–145)

## 2023-06-15 MED ORDER — ONDANSETRON HCL 4 MG/2ML IJ SOLN: 4.0000 mg | Freq: Four times a day (QID) | INTRAMUSCULAR | Status: DC | PRN

## 2023-06-15 MED ORDER — HALOPERIDOL LACTATE 2 MG/ML PO CONC: 2.0000 mg | ORAL | Status: DC | PRN

## 2023-06-15 MED ORDER — GLYCOPYRROLATE 0.2 MG/ML IJ SOLN
0.4000 mg | INTRAMUSCULAR | Status: DC
  Administered 2023-06-15 – 2023-06-16 (×7): 0.4 mg via INTRAVENOUS
  Filled 2023-06-15 (×7): qty 2

## 2023-06-15 MED ORDER — FUROSEMIDE 10 MG/ML IJ SOLN
120.0000 mg | Freq: Four times a day (QID) | INTRAVENOUS | Status: DC
  Administered 2023-06-15 (×2): 120 mg via INTRAVENOUS
  Filled 2023-06-15: qty 2
  Filled 2023-06-15: qty 120

## 2023-06-15 MED ORDER — BIOTENE DRY MOUTH MT LIQD: 15.0000 mL | OROMUCOSAL | Status: DC | PRN

## 2023-06-15 MED ORDER — LORAZEPAM 2 MG/ML IJ SOLN: 0.5000 mg | INTRAMUSCULAR | Status: DC | PRN

## 2023-06-15 MED ORDER — HYDROMORPHONE HCL 1 MG/ML IJ SOLN
0.5000 mg | INTRAMUSCULAR | Status: DC | PRN
  Administered 2023-06-15 – 2023-06-16 (×2): 0.5 mg via INTRAVENOUS
  Filled 2023-06-15 (×2): qty 0.5

## 2023-06-15 MED ORDER — HALOPERIDOL LACTATE 5 MG/ML IJ SOLN: 2.0000 mg | INTRAMUSCULAR | Status: DC | PRN

## 2023-06-15 MED ORDER — ACETAMINOPHEN 650 MG RE SUPP: 650.0000 mg | Freq: Four times a day (QID) | RECTAL | Status: DC | PRN

## 2023-06-15 MED ORDER — ONDANSETRON 4 MG PO TBDP
4.0000 mg | ORAL_TABLET | Freq: Four times a day (QID) | ORAL | Status: DC | PRN
Start: 2023-06-15 — End: 2023-06-16

## 2023-06-15 NOTE — Progress Notes (Signed)
 [facility] Progress Note   Subjective: Unable to have any volume removed in HD over night D/T hypotension. Remains volume overloaded and hypotensive today.    Objective Vitals:   06/15/23 0729 06/15/23 1044 06/15/23 1238 06/15/23 1241  BP: 113/89 118/86 101/78 101/78  Pulse: (!) 51 89 (!) 108 97  Resp: 20 20 16 16   Temp: (!) 97.5 F (36.4 C) (!) 97.3 F (36.3 C)    TempSrc: Axillary Axillary    SpO2: 90% 94%  100%  Weight:      Height:        Additional Objective Labs: Basic Metabolic Panel: Recent Labs  Lab 06/14/23 0400 06/14/23 0424 06/15/23 1030  NA 140 138 135  K 2.9* 3.2* 3.7  CL 97* 95* 98  CO2 30  --  23  GLUCOSE 122* 112* 68*  BUN 15 20 11   CREATININE 2.14* 2.30* 1.62*  CALCIUM  8.0*  --  7.9*  PHOS  --   --  3.6   Liver Function Tests: Recent Labs  Lab 06/14/23 0400 06/15/23 1030  AST 18  --   ALT 23  --   ALKPHOS 84  --   BILITOT 0.9  --   PROT 4.9*  --   ALBUMIN  1.9* 2.1*   No results for input(s): "LIPASE", "AMYLASE" in the last 168 hours. CBC: Recent Labs  Lab 06/14/23 0400 06/14/23 0424 06/15/23 1030  WBC 12.8*  --  12.4*  NEUTROABS 11.6*  --   --   HGB 10.0* 10.9* 9.3*  HCT 32.6* 32.0* 30.5*  MCV 89.6  --  89.4  PLT 122*  --  87*   Blood Culture    Component Value Date/Time   SDES BLOOD RIGHT ANTECUBITAL 06/14/2023 0403   SDES BLOOD RIGHT ANTECUBITAL 06/14/2023 0403   SPECREQUEST  06/14/2023 0403    BOTTLES DRAWN AEROBIC AND ANAEROBIC Blood Culture results may not be optimal due to an inadequate volume of blood received in culture bottles   SPECREQUEST  06/14/2023 0403    BOTTLES DRAWN AEROBIC AND ANAEROBIC Blood Culture results may not be optimal due to an inadequate volume of blood received in culture bottles   CULT  06/14/2023 0403    NO GROWTH 1 DAY Performed at [facility] Lab, [address] [place], [place] [address]    CULT  06/14/2023 0403    NO GROWTH 1 DAY Performed at [facility]  Lab, [address], [place], [place] [address]    REPTSTATUS PENDING 06/14/2023 0403   REPTSTATUS PENDING 06/14/2023 0403    Cardiac Enzymes: No results for input(s): "CKTOTAL", "CKMB", "CKMBINDEX", "TROPONINI" in the last 168 hours. CBG: Recent Labs  Lab 06/14/23 2007 06/14/23 2044 06/15/23 0304 06/15/23 0741 06/15/23 1237  GLUCAP 57* 199* 87 94 89   Iron Studies: No results for input(s): "IRON", "TIBC", "TRANSFERRIN", "FERRITIN" in the last 72 hours. @lablastinr3 @ Studies/Results: DG Chest Port 1 View Result Date: 06/14/2023 CLINICAL DATA:  Weakness EXAM: PORTABLE CHEST 1 VIEW COMPARISON:  04/11/2023 FINDINGS: Patchy left lower lobe/retrocardiac opacity, chronic and possibly reflecting scarring/atelectasis, although left lower lobe pneumonia is not excluded. Small bilateral pleural effusions, new. No frank interstitial edema. The heart is normal in size. Thoracic aortic atherosclerosis. Right IJ dual lumen dialysis catheter terminating in the upper right atrium. IMPRESSION: Patchy left lower lobe/retrocardiac opacity, chronic and possibly reflecting scarring/atelectasis, although left lower lobe pneumonia is not excluded. Small bilateral pleural effusions, new. Electronically Signed   By: Pecolia Bourbon.D.  On: 06/14/2023 02:33   Medications:  cefTRIAXone  (ROCEPHIN )  IV 2 g (06/15/23 0929)   furosemide  120 mg (06/15/23 0808)    (feeding supplement) PROSource Plus  30 mL Oral BID BM   diphenhydrAMINE   25 mg Oral QHS   And   acetaminophen   500 mg Oral QHS   amiodarone   200 mg Oral Daily   aspirin   81 mg Oral Daily   Chlorhexidine  Gluconate Cloth  6 each Topical Daily   Chlorhexidine  Gluconate Cloth  6 each Topical Q0600   feeding supplement  237 mL Oral BID BM   lipase/protease/amylase  36,000 Units Oral BID PC   midodrine   10 mg Oral TID WC   midodrine   10 mg Oral Q M,W,F-HD   multivitamin with minerals  1 tablet Oral q AM   pantoprazole   40 mg Oral Daily   polyvinyl  alcohol   1 drop Both Eyes QHS   rOPINIRole   0.75 mg Oral QHS   sevelamer  carbonate  1,600 mg Oral BID WC   sodium chloride  flush  10-40 mL Intracatheter Q12H     General: Chronically ill appearing very elderly female in no acute distress. Head: Normocephalic, atraumatic, sclera non-icteric, mucus membranes are moist Neck: Supple. JVD 1/4 to mandible Lungs: Bilateral breath sounds bibasilar crackles, few scattered upper airway rhonchi. No WOB. Wet cough.  Heart: S1,S2 irreg, irreg no M/R/G . Abdomen: Soft, non-tender, non-distended with normoactive bowel sounds. No rebound/guarding. No obvious abdominal masses. Lower extremities:2+ pitting edema and erythema BLE R>L. Dressing on upper extremities.  Neuro: Alert and oriented X 2 Moves all extremities spontaneously. Psych:  Responds to questions appropriately with a normal affect. Dialysis Access: RIJ TDC drsg intact.    Dialysis Orders: Center: [facility] MWF 3.5 hrs 160NRe 350/Autoflow 1.5 49.7 kg 2.0 K/2.0 Ca TDC - no heparin  - Hectorol  1 mcg IV three times per week   Assessment/Plan:  Volume overload: DID have HD 06/13/2023 but forgot to take midodrine . Unable to remove volume DT hypotension and pt left above OP EDW. Ordered extra treatment 06/15/2023 but unable to remove volume HD D/T hypotension. She is not tolerating IHD. Either CRRT or comfort care at this point but do not believe in her current very poor state of health that escalating to CRRT would be of long term benefit for this pt. Palliatve care involved.  Possible Cellulitis: Per primary Possible CAP: per primary Hypoglycemia-BS 278 06/14/2023 with blood from earlobe, Resolved. Hypokalemia-KCL 10 meq IV over an hour X 2. Use 4.0 K bath.   ESRD -  MWF. Extra treatment today. Next HD 06/16/2023 but not tolerating IDH. No orders until plan in place for either comfort care or CRRT.   Hypertension/volume  - Increase midodrine  to 10 mg PO TID. Midodrine  10 mg PO prior to HD on MWF. See  #1.   Anemia  - HGB in 10 range. No ESA at OP clinic. Follow labs  Metabolic bone disease -  continue VDRA, binders. Check PO4.   Nutrition - Albumin  1.9. protein supplements  Afib-no anticoagulant. Rate is controlled.   Debility and decline: Very elderly patient with multiple comorbid conditions. She is a full code. Will consult Palliative Care for GOC.    Burdette Forehand H. Dquan Cortopassi NP-C 06/15/2023, 1:50 PM  [employer] [phone]

## 2023-06-15 NOTE — Evaluation (Signed)
 Clinical/Bedside Swallow Evaluation Kim Burns Details  Name: Kim Burns MRN: [id number] Date of Birth: 18-Oct-1935  Today's Date: 06/15/2023 Time: SLP Start Time (ACUTE ONLY): 1130 SLP Stop Time (ACUTE ONLY): 1152 SLP Time Calculation (min) (ACUTE ONLY): 22 min  Past Medical History:  Past Medical History:  Diagnosis Date   Anemia of chronic disease    takes iron   Anxiety    Atrial fibrillation (HCC)    Blood transfusion without reported diagnosis    Bradycardia    Diabetes mellitus without complication (HCC)    became diabetic after Whipple procedure   Diarrhea    Dysrhythmia 04/16/2016   bradycardia due to medication    ESRD on hemodialysis [facility])    M-W-F   GERD (gastroesophageal reflux disease)    Gout    Headache    History of kidney stones 06/2013   Hyperparathyroidism (HCC)    Hypertension    PONV (postoperative nausea and vomiting)    Sleep apnea    no cpap machine. could not tolerate   Stroke (HCC) 04/16/2016   TIA 1995   Vitamin D  deficiency    Past Surgical History:  Past Surgical History:  Procedure Laterality Date   A/V FISTULAGRAM Left 08/30/2021   Procedure: A/V Fistulagram;  Surgeon: Young Hensen, MD;  Location: [facility] INVASIVE CV LAB;  Service: Cardiovascular;  Laterality: Left;   A/V FISTULAGRAM N/A 02/25/2023   Procedure: A/V Fistulagram;  Surgeon: Adine Hoof, MD;  Location: [facility] INVASIVE CV LAB;  Service: Vascular;  Laterality: N/A;   A/V FISTULAGRAM N/A 05/27/2023   Procedure: A/V Fistulagram;  Surgeon: Margherita Shell, MD;  Location: [facility] INVASIVE CV LAB;  Service: Cardiovascular;  Laterality: N/A;   A/V SHUNT INTERVENTION N/A 05/27/2023   Procedure: A/V SHUNT INTERVENTION;  Surgeon: Margherita Shell, MD;  Location: [facility] INVASIVE CV LAB;  Service: Cardiovascular;  Laterality: N/A;   ABDOMINAL HYSTERECTOMY  1985   complete   BACK SURGERY  1980   lower   BASCILIC VEIN TRANSPOSITION Left 04/24/2017   Procedure: LEFT ARM FIRST STAGE  BASILIC VEIN TRANSPOSITION;  Surgeon: Margherita Shell, MD;  Location: [facility] OR;  Service: Vascular;  Laterality: Left;   BASCILIC VEIN TRANSPOSITION Left 07/10/2017   Procedure: SECOND STAGE BASILIC VEIN TRANSPOSITION LEFT ARM;  Surgeon: Margherita Shell, MD;  Location: [facility] OR;  Service: Vascular;  Laterality: Left;   BREAST LUMPECTOMY Left x 2   many years apart, benign   CHOLECYSTECTOMY  1985   CYSTOSCOPY W/ URETERAL STENT PLACEMENT Left 10/31/2021   Procedure: CYSTOSCOPY WITH RETROGRADE PYELOGRAM/URETERAL STENT PLACEMENT;  Surgeon: Andrez Banker, MD;  Location: [facility] OR;  Service: Urology;  Laterality: Left;   CYSTOSCOPY WITH URETEROSCOPY AND STENT PLACEMENT Left 06/18/2013   Procedure: CYSTOSCOPY WITH Lef URETEROSCOPY AND Left STENT PLACEMENT;  Surgeon: Kristeen Peto, MD;  Location: [facility] ORS;  Service: Urology;  Laterality: Left;   DIALYSIS/PERMA CATHETER INSERTION N/A 03/13/2023   Procedure: DIALYSIS/PERMA CATHETER INSERTION;  Surgeon: Melodie Spry, MD;  Location: [facility] INVASIVE CV LAB;  Service: Cardiovascular;  Laterality: N/A;   ESOPHAGOGASTRODUODENOSCOPY (EGD) WITH PROPOFOL  N/A 11/13/2016   Procedure: ESOPHAGOGASTRODUODENOSCOPY (EGD) WITH PROPOFOL ;  Surgeon: Felecia Hopper, MD;  Location: [facility] ENDOSCOPY;  Service: Gastroenterology;  Laterality: N/A;   EUS N/A 02/07/2016   Procedure: ESOPHAGEAL ENDOSCOPIC ULTRASOUND (EUS) RADIAL;  Surgeon: Evangeline Hilts, MD;  Location: [facility] ENDOSCOPY;  Service: Endoscopy;  Laterality: N/A;   EYE SURGERY Bilateral 2014   ioc for catracts    FOOT  SURGERY Left 1990   something with toes unsure what    HERNIA REPAIR  2018   IR CATHETER TUBE CHANGE  03/14/2022   IR EMBO TUMOR ORGAN ISCHEMIA INFARCT INC GUIDE ROADMAPPING  08/22/2021   IR NEPHROSTOMY EXCHANGE LEFT  06/27/2022   IR NEPHROSTOMY EXCHANGE LEFT  08/26/2022   IR NEPHROSTOMY EXCHANGE LEFT  10/22/2022   IR NEPHROSTOMY EXCHANGE LEFT  12/16/2022   IR NEPHROSTOMY EXCHANGE LEFT  12/30/2022   IR NEPHROSTOMY  EXCHANGE LEFT  02/10/2023   IR NEPHROSTOMY EXCHANGE LEFT  04/07/2023   IR NEPHROSTOMY EXCHANGE LEFT  06/12/2023   IR NEPHROSTOMY PLACEMENT LEFT  03/14/2022   IR NEPHROSTOMY PLACEMENT LEFT  05/01/2023   IR RADIOLOGIST EVAL & MGMT  05/18/2021   IR RADIOLOGIST EVAL & MGMT  10/01/2021   IR RADIOLOGIST EVAL & MGMT  11/12/2021   IR RADIOLOGIST EVAL & MGMT  11/22/2021   IR RADIOLOGIST EVAL & MGMT  11/27/2021   IR RADIOLOGIST EVAL & MGMT  12/20/2021   IR RENAL SUPRASEL UNI S&I MOD SED  08/22/2021   IR US  GUIDE VASC ACCESS RIGHT  08/22/2021   LEFT HEART CATH AND CORONARY ANGIOGRAPHY N/A 08/27/2021   Procedure: LEFT HEART CATH AND CORONARY ANGIOGRAPHY;  Surgeon: Odie Benne, MD;  Location: [facility] INVASIVE CV LAB;  Service: Cardiovascular;  Laterality: N/A;   PARATHYROID  EXPLORATION     PERIPHERAL VASCULAR BALLOON ANGIOPLASTY Left 08/30/2021   Procedure: PERIPHERAL VASCULAR BALLOON ANGIOPLASTY;  Surgeon: Young Hensen, MD;  Location: [facility] INVASIVE CV LAB;  Service: Cardiovascular;  Laterality: Left;  arm fistula   PERIPHERAL VASCULAR BALLOON ANGIOPLASTY Left 02/25/2023   Procedure: PERIPHERAL VASCULAR BALLOON ANGIOPLASTY;  Surgeon: Adine Hoof, MD;  Location: [facility] INVASIVE CV LAB;  Service: Vascular;  Laterality: Left;  left AVF   RADIOLOGY WITH ANESTHESIA Left 08/22/2021   Procedure: MICROWAVE ABLATION;  Surgeon: Robbi Childs, MD;  Location: [facility] ORS;  Service: Anesthesiology;  Laterality: Left;   WHIPPLE PROCEDURE N/A 04/23/2016   Procedure: WHIPPLE PROCEDURE;  Surgeon: Lockie Rima, MD;  Location: [facility] OR;  Service: General;  Laterality: N/A;   HPI:  Kim Burns is a 88 year old female with ESRD on HD, MWF, PAF not on AC, HFpEF, DM type II,  history of intraductal papillary mucinous neoplasm of the pancreas-s/p Whipple, renal cell carcinoma s/p ablation complicated by urine leak s/p nephrostomy tube presented with cough and shortness of breath that started on the day of admission. CXR  showed patchy left lower lobe and retrocardiac opacity chronic and possibly reflecting scarring/atelectasis although left lower lobe pneumonia not excluded with small bilateral pleural effusions that  are new.  MD note states CXR with left sided pna, also concern for possible aspiration. BSE 09/17/22  with prolonged mastication, pharyngeal phase WFL's, recommended Dys 3/thin. Following day SLP upgraded to regular.    Assessment / Plan / Recommendation  Clinical Impression  Pt was awake, ill appearing and deconditioned. At baseline, pror to any po's she had a frequent congested cough that was present throughout assessment. There were multiple swallows. sometimes effortful. Immediate and delayed coughs present not related to certain consistency. She had mildly prolonged mastication with cracker and recommend texture downgrade to Dys 3 (chopped meats). Discussed results with pt that cannot determine state of pharyngeal swallow without an MBS however she declined each time this was offered not stating reason then stated she would "sleep on it." She did not want to have liquids thickened and if aspirating thinner liquids are safer to  aspirate then thick. She did not want to have pills crushed and recommend whole in applesauce. RN states yesterday her mentation was good and today she is slightly more confused. She was oriented to place, month, year and birthdate. She wanted this SLP to leave her alone and needed encouragement to take po's at one point during eval. SLP plans to return for further observation and willingness to participate in MBS if still warranted. SLP Visit Diagnosis: Dysphagia, unspecified (R13.10)    Aspiration Risk  Moderate aspiration risk    Diet Recommendation Dysphagia 3 (Mech soft);Thin liquid    Liquid Administration via: Cup Medication Administration: Whole meds with puree (pt declined to have crushed meds)    Other  Recommendations Oral Care Recommendations: Oral care BID     Recommendations for follow up therapy are one component of a multi-disciplinary discharge planning process, led by the attending physician.  Recommendations may be updated based on Kim Burns status, additional functional criteria and insurance authorization.  Follow up Recommendations  (TBD)      Assistance Recommended at Discharge    Functional Status Assessment Kim Burns has had a recent decline in their functional status and demonstrates the ability to make significant improvements in function in a reasonable and predictable amount of time.  Frequency and Duration min 2x/week  2 weeks       Prognosis Prognosis for improved oropharyngeal function: Fair      Swallow Study   General Date of Onset: 06/15/23 HPI: Kim Burns is a 88 year old female with ESRD on HD, MWF, PAF not on AC, HFpEF, DM type II,  history of intraductal papillary mucinous neoplasm of the pancreas-s/p Whipple, renal cell carcinoma s/p ablation complicated by urine leak s/p nephrostomy tube presented with cough and shortness of breath that started on the day of admission. CXR showed patchy left lower lobe and retrocardiac opacity chronic and possibly reflecting scarring/atelectasis although left lower lobe pneumonia not excluded with small bilateral pleural effusions that  are new.  MD note states CXR with left sided pna, also concern for possible aspiration. BSE 09/17/22  with prolonged mastication, pharyngeal phase WFL's, recommended Dys 3/thin. Following day SLP upgraded to regular. Type of Study: Bedside Swallow Evaluation Previous Swallow Assessment:  (see HPI) Diet Prior to this Study: Regular;Thin liquids (Level 0) Temperature Spikes Noted: No Respiratory Status: Room air History of Recent Intubation: No Behavior/Cognition: Alert;Requires cueing Oral Cavity Assessment: Dry Oral Care Completed by SLP: No Oral Cavity - Dentition: Adequate natural dentition Vision: Functional for self-feeding Self-Feeding Abilities:  Needs assist Kim Burns Positioning: Upright in bed Baseline Vocal Quality: Normal Volitional Cough: Congested    Oral/Motor/Sensory Function Overall Oral Motor/Sensory Function: Within functional limits   Ice Chips Ice chips: Not tested   Thin Liquid Thin Liquid: Impaired Presentation: Cup Pharyngeal  Phase Impairments: Cough - Immediate;Multiple swallows    Nectar Thick Nectar Thick Liquid: Not tested   Honey Thick Honey Thick Liquid: Not tested   Puree Puree: Impaired Pharyngeal Phase Impairments: Multiple swallows;Cough - Delayed   Solid     Solid: Impaired Oral Phase Functional Implications: Prolonged oral transit      Naomia Bachelor 06/15/2023,12:33 PM

## 2023-06-15 NOTE — Consult Note (Signed)
 Consultation Note Date: 06/15/2023   Patient Name: Kim Burns  DOB: [birth year]  MRN: [id number]  Age / Sex: 88 y.o., female  PCP: [name], MD Referring Physician: Loma Rising, MD  Reason for Consultation: Establishing goals of care  HPI/Patient Profile: 88 y.o. female  with past medical history of ESRD on HD MWF, PAF not on anticoagulation, HFmrEF, DM, CVA, history of intraductal papillary mucinous neoplasm of the pancreas s/p Whipple, history of renal cell carcinoma s/p ablation complicated by urine leak s/p nephrostomy tubes admitted on 06/14/2023 with cough, shortness of breath, hypotension due to fluid overload with severe hypoalbuminemia as well as hypotension limiting adequate HD sessions.   Clinical Assessment and Goals of Care: Consult received and chart review completed. Reviewed previous palliative notes/conversations. Discussed with Dr. Thelma Fire and RN and later with Dr. Cindra Cree and Jacobo Masters, NP.   I met today at Kim Burns's bedside along with son, [name]. I spoke privately with [name] regarding goals of care. We reviewed his conversations with medical team and he is aware that his mother has not been tolerating dialysis well. He agrees with comfort care in the setting that her body is no longer tolerating dialysis. He shares how the the past month and then couple weeks have been more and more difficult with significantly declining functional status. [name] and his wife have been caring for her at her home. The fluid buildup in her legs was making her less ambulatory and we discussed that the fluid is due to the inability to draw of fluid during dialysis because of her low blood pressure. He understands that her decline is a direct side effect of her body no longer able to endure dialysis. He is at peace with the decision for full comfort care. We discussed hospice facility placement and  he agrees - he elects [facility] facility as this will be closer to family to visit and be with her. He reports that his 2 sisters should be here to visit by tomorrow (1 is coming from [facility]).   We discussed medications for comfort at end of life, d/c tele/monitors, and focus on rest. He understands that we do not anticipate she will be able to have intake. We discussed plan to move to hospice facility but in the event that she declines further in the hospital before able to transfer we may elect to continue comfort care here.   There are 3 children, 9 grandchildren, and 3 great grandchildren. [name] shares that his mother was very talented at [employer], crocheting, and sewing. She won state wide contests as well as many locally. She attended church until she was physically unable - [name] plans to reach out to her pastor to update on situation and to have a visit.   All questions/concerns addressed. Emotional support provided.   Primary Decision Maker HCPOA son [name]    SUMMARY OF RECOMMENDATIONS   - DNR/DNI - Full comfort care - Transition to [facility] hospice facility for end of life care  Code  Status/Advance Care Planning: DNR   Symptom Management:  Medications ordered for symptom management. May increase dosage/frequency as needed to achieve relief and comfort.  Prognosis:  Likely days.   Discharge Planning: Hospice facility      Primary Diagnoses: Present on Admission:  Fluid overload  Leukocytosis  CAP (community acquired pneumonia)  Cellulitis  Hypoglycemia  Hypotension  Paroxysmal atrial fibrillation (HCC)  Protein calorie malnutrition (HCC)  Debility   I have reviewed the medical record, interviewed the patient and family, and examined the patient. The following aspects are pertinent.  Past Medical History:  Diagnosis Date   Anemia of chronic disease    takes iron   Anxiety    Atrial fibrillation (HCC)    Blood transfusion without  reported diagnosis    Bradycardia    Diabetes mellitus without complication (HCC)    became diabetic after Whipple procedure   Diarrhea    Dysrhythmia 04/16/2016   bradycardia due to medication    ESRD on hemodialysis [facility])    M-W-F   GERD (gastroesophageal reflux disease)    Gout    Headache    History of kidney stones 06/2013   Hyperparathyroidism (HCC)    Hypertension    PONV (postoperative nausea and vomiting)    Sleep apnea    no cpap machine. could not tolerate   Stroke (HCC) 04/16/2016   TIA 1995   Vitamin D  deficiency    Social History   Socioeconomic History   Marital status: Widowed    Spouse name: Not on file   Number of children: Not on file   Years of education: Not on file   Highest education level: Not on file  Occupational History   Not on file  Tobacco Use   Smoking status: Never   Smokeless tobacco: Never  Vaping Use   Vaping status: Never Used  Substance and Sexual Activity   Alcohol  use: No   Drug use: No   Sexual activity: Not on file  Other Topics Concern   Not on file  Social History Narrative   Not on file   Social Drivers of Health   Financial Resource Strain: Low Risk  (05/28/2023)   Overall Financial Resource Strain (CARDIA)    Difficulty of Paying Living Expenses: Not very hard  Food Insecurity: No Food Insecurity (05/28/2023)   Hunger Vital Sign    Worried About Running Out of Food in the Last Year: Never true    Ran Out of Food in the Last Year: Never true  Transportation Needs: No Transportation Needs (06/10/2023)   PRAPARE - Administrator, Civil Service (Medical): No    Lack of Transportation (Non-Medical): No  Physical Activity: Inactive (05/28/2023)   Exercise Vital Sign    Days of Exercise per Week: 0 days    Minutes of Exercise per Session: 0 min  Stress: No Stress Concern Present (01/08/2023)   [employer] of Occupational Health - Occupational Stress Questionnaire    Feeling of Stress : Not at all   Social Connections: Socially Isolated (05/28/2023)   Social Connection and Isolation Panel [NHANES]    Frequency of Communication with Friends and Family: More than three times a week    Frequency of Social Gatherings with Friends and Family: More than three times a week    Attends Religious Services: Never    Database administrator or Organizations: No    Attends Banker Meetings: Never    Marital Status: Widowed   Family  History  Problem Relation Age of Onset   Heart disease Mother    Stroke Mother    Cancer Father        colon   Alzheimer's disease Sister    Alzheimer's disease Sister    Cancer Brother        brain   Breast cancer Neg Hx    Scheduled Meds:  (feeding supplement) PROSource Plus  30 mL Oral BID BM   diphenhydrAMINE   25 mg Oral QHS   And   acetaminophen   500 mg Oral QHS   amiodarone   200 mg Oral Daily   aspirin   81 mg Oral Daily   Chlorhexidine  Gluconate Cloth  6 each Topical Daily   Chlorhexidine  Gluconate Cloth  6 each Topical Q0600   feeding supplement  237 mL Oral BID BM   lipase/protease/amylase  36,000 Units Oral BID PC   midodrine   10 mg Oral TID WC   midodrine   10 mg Oral Q M,W,F-HD   multivitamin with minerals  1 tablet Oral q AM   pantoprazole   40 mg Oral Daily   polyvinyl alcohol   1 drop Both Eyes QHS   rOPINIRole   0.75 mg Oral QHS   sevelamer  carbonate  1,600 mg Oral BID WC   sodium chloride  flush  10-40 mL Intracatheter Q12H   Continuous Infusions:  cefTRIAXone  (ROCEPHIN )  IV 2 g (06/15/23 0929)   furosemide  120 mg (06/15/23 0808)   PRN Meds:.acetaminophen  **OR** acetaminophen , artificial tears, benzonatate , dextrose , meclizine , polyvinyl alcohol , sodium chloride  flush, traMADol  Allergies  Allergen Reactions   Lopid [Gemfibrozil] Other (See Comments)    Myalgia    Lotemax [Loteprednol Etabonate] Rash    Skin burning  Blurry vision   Statins Other (See Comments)    Rosuvastatin Muscle weakness   Other Diarrhea    Real  Butter - diarrhea; patient still uses butter   Vibra -Tab [Doxycycline ] Other (See Comments)    Unknown reaction   Amlodipine Other (See Comments)    Norvasc   Codeine Other (See Comments)    headache   Hydrocodone      Headache    Lisinopril Other (See Comments)   Verapamil  Other (See Comments)   Review of Systems  Unable to perform ROS: Acuity of condition    Physical Exam Vitals and nursing note reviewed.  Constitutional:      General: She is sleeping. She is not in acute distress.    Appearance: She is ill-appearing.  Cardiovascular:     Rate and Rhythm: Normal rate.     Comments: Warm to touch Pulmonary:     Effort: No tachypnea, accessory muscle usage or retractions.     Comments: Shallow breathes Abdominal:     General: Abdomen is flat.     Palpations: Abdomen is soft.  Musculoskeletal:     Right lower leg: Edema present.     Left lower leg: Edema present.  Skin:    Comments: Petechiae and bruising notes in all extremities in particular and worse in LUE. Skin thin and fragile.   Neurological:     Comments: Did not awaken to voice or gentle assessment.      Vital Signs: BP 101/78 (BP Location: Right Arm)   Pulse 97   Temp (!) 97.3 F (36.3 C) (Axillary)   Resp 16   Ht 5\' 3"  (1.6 m)   Wt 53.5 kg   SpO2 100%   BMI 20.89 kg/m  Pain Scale: PAINAD   Pain Score: 5    SpO2: SpO2:  100 % O2 Device:SpO2: 100 % O2 Flow Rate: .O2 Flow Rate (L/min): 6 L/min  IO: Intake/output summary:  Intake/Output Summary (Last 24 hours) at 06/15/2023 1315 Last data filed at 06/15/2023 0158 Gross per 24 hour  Intake 198.92 ml  Output 10.4 ml  Net 188.52 ml    LBM: Last BM Date : 06/14/23 Baseline Weight: Weight: 53.5 kg Most recent weight: Weight: 53.5 kg     Palliative Assessment/Data:    Time Total: 75 min  Greater than 50%  of this time was spent counseling and coordinating care related to the above assessment and plan.  Signed by: Vila Grayer,  NP Palliative Medicine Team Pager # [phone] (M-F 8a-5p) Team Phone # [phone] (Nights/Weekends)

## 2023-06-15 NOTE — Evaluation (Signed)
 Physical Therapy Evaluation Patient Details Name: Kim Burns MRN: [id number] DOB: September 26, 1935 Today's Date: 06/15/2023  History of Present Illness  88 y.o. female presents to [facility] on 06/14/23 for difficulty breathing and wet cough with LUE discolored and swelling. + PNA and fluid overload.  PMH - afib, ESRD on HD, chf, DM, HTN, Whipple, gout, bradycardia, TIA, CVA  Clinical Impression  Pt admitted with above diagnosis. Pt confused and difficult to understand on eval, home info obtained from chart from prior admission. Pt with wet cough and pain all over with touch or movement. Rolled bilaterally and repositioned on bed for comfort and pt asking to be left alone. Max A +2 needed for positioning. Per chart pt was ambulatory with rollator. Do not expect she would be able to do that currently. Recommend return home as long as pt agreeable to giving full care. Rec HHPT.  Pt currently with functional limitations due to the deficits listed below (see PT Problem List). Pt will benefit from acute skilled PT to increase their independence and safety with mobility to allow discharge.           If plan is discharge home, recommend the following: Two people to help with walking and/or transfers;Two people to help with bathing/dressing/bathroom;Assistance with cooking/housework;Assistance with feeding;Assist for transportation;Help with stairs or ramp for entrance;Supervision due to cognitive status   Can travel by private vehicle        Equipment Recommendations None recommended by PT  Recommendations for Other Services       Functional Status Assessment Patient has had a recent decline in their functional status and demonstrates the ability to make significant improvements in function in a reasonable and predictable amount of time.     Precautions / Restrictions Precautions Precautions: Fall;Other (comment) Recall of Precautions/Restrictions: Impaired Precaution/Restrictions Comments: very thin  skin Required Braces or Orthoses: Other Brace Other Brace: has prevalons Restrictions Weight Bearing Restrictions Per Provider Order: No      Mobility  Bed Mobility Overal bed mobility: Needs Assistance Bed Mobility: Rolling Rolling: Max assist         General bed mobility comments: required max A to roll R and L and painful with all movement. Did assist by holding rail with top hand but could not sustain position on her own. '    Transfers                   General transfer comment: pt unable to tolerate today    Ambulation/Gait                  Stairs            Wheelchair Mobility     Tilt Bed    Modified Rankin (Stroke Patients Only)       Balance Overall balance assessment: Needs assistance                                           Pertinent Vitals/Pain Pain Assessment Pain Assessment: Faces Faces Pain Scale: Hurts even more Pain Location: generalized to touch Pain Descriptors / Indicators: Tender Pain Intervention(s): Limited activity within patient's tolerance, Monitored during session    Home Living Family/patient expects to be discharged to:: Private residence Living Arrangements: Children Available Help at Discharge: Family;Available 24 hours/day Type of Home: House Home Access: Ramped entrance       Home Layout:  Able to live on main level with bedroom/bathroom;Laundry or work area in [employer] Equipment: Agricultural consultant (2 wheels);Shower seat;Rollator (4 wheels);Wheelchair - manual;Cane - single point;Tub bench Additional Comments: info from last admission, pt unable to answer questions and difficult to understand her when she does speak    Prior Function Prior Level of Function : Needs assist             Mobility Comments: per chart ambulated with rollator with supervision       Extremity/Trunk Assessment   Upper Extremity Assessment Upper Extremity Assessment: Defer to OT evaluation     Lower Extremity Assessment Lower Extremity Assessment: Generalized weakness    Cervical / Trunk Assessment Cervical / Trunk Assessment: Kyphotic  Communication   Communication Communication: Impaired Factors Affecting Communication: Reduced clarity of speech    Cognition Arousal: Lethargic Behavior During Therapy: Flat affect   PT - Cognitive impairments: No family/caregiver present to determine baseline                       PT - Cognition Comments: asking where the cat is, saying she does not want to do anything but be left alone. Unable to accurately answer questions. Dysarthric Following commands: Impaired Following commands impaired: Follows one step commands inconsistently     Cueing Cueing Techniques: Verbal cues, Gestural cues, Tactile cues     General Comments General comments (skin integrity, edema, etc.): has multiple wounds, LUE weeping. Prevalon boots repositioned and pillow placed under knees for support. Pt with wet cough    Exercises Other Exercises Other Exercises: PROM to BLE's   Assessment/Plan    PT Assessment Patient needs continued PT services  PT Problem List Decreased strength;Decreased range of motion;Decreased activity tolerance;Decreased balance;Decreased mobility;Decreased coordination;Decreased cognition;Decreased skin integrity;Pain       PT Treatment Interventions Functional mobility training;Therapeutic activities;Therapeutic exercise;Balance training;Patient/family education    PT Goals (Current goals can be found in the Care Plan section)  Acute Rehab PT Goals Patient Stated Goal: be left alone PT Goal Formulation: Patient unable to participate in goal setting Time For Goal Achievement: 06/29/23 Potential to Achieve Goals: Fair    Frequency Min 1X/week     Co-evaluation               AM-PAC PT "6 Clicks" Mobility  Outcome Measure Help needed turning from your back to your side while in a flat bed without  using bedrails?: Total Help needed moving from lying on your back to sitting on the side of a flat bed without using bedrails?: Total Help needed moving to and from a bed to a chair (including a wheelchair)?: Total Help needed standing up from a chair using your arms (e.g., wheelchair or bedside chair)?: Total Help needed to walk in hospital room?: Total Help needed climbing 3-5 steps with a railing? : Total 6 Click Score: 6    End of Session   Activity Tolerance: Patient limited by pain;Patient limited by fatigue;Patient limited by lethargy Patient left: in bed;with call bell/phone within reach;with bed alarm set Nurse Communication: Mobility status PT Visit Diagnosis: Muscle weakness (generalized) (M62.81);Pain;Adult, failure to thrive (R62.7) Pain - part of body:  (generalized)    Time: 1610-9604 PT Time Calculation (min) (ACUTE ONLY): 15 min   Charges:   PT Evaluation $PT Eval Moderate Complexity: 1 Mod   PT General Charges $$ ACUTE PT VISIT: 1 Visit         Amey Ka, PT  Acute Rehab Services  Secure chat preferred Office [phone]   Deloris Fetters Ilisha Blust 06/15/2023, 1:56 PM

## 2023-06-15 NOTE — Progress Notes (Signed)
 [facility]                                                                              Kim Burns, is a 88 y.o. female, DOB - 10-09-1935, OZD:[id number] Admit date - 06/14/2023    Outpatient Primary MD for the patient is Kim Anda, MD  LOS - 1  days  Chief Complaint  Patient presents with   Hypotension       Brief summary   Patient is a 88 year old female with ESRD on HD, MWF, PAF not on AC, HFpEF, DM type II,  history of intraductal papillary mucinous neoplasm of the pancreas-s/p Whipple, renal cell carcinoma s/p ablation complicated by urine leak s/p nephrostomy tube presented with cough and shortness of breath that started on the day of admission.  History per patient's son over the phone and review of records.   Patient had attended her dialysis on 4/25, patient has had issues with hypotension and takes midodrine  prior to dialysis.  The day of admission, she had dropped her pill and was unable to take it prior to the session.  When she was at dialysis they gave her midodrine  but per EDP note, did not complete the session. She has skin tears on her arms, particularly on the right arm, which occurred during a nephrostomy tube exchange at [facility] 2 days ago.  The left arm is more concerning due to significant swelling, which developed over the past three weeks.  Her son feels the the swelling is associated with her fistula and was exacerbated by a Coban wrap that slipped into her elbow joint during a procedure. Records note patient had just underwent fistulogram with venoplasty of the left basilic vein Dr. Charlotte Cookey on 4/8.  Patient has persistent cough which has recently become more congested. Her mobility has significantly declined over the past 3 weeks and requires assistance to move to the bathroom and back.   In ED, BP 93/68, hemoglobin 10, platelets 122, potassium 2.9.  BNP 2475.2 CXR showed patchy left lower lobe and retrocardiac opacity chronic and  possibly reflecting scarring/atelectasis although left lower lobe pneumonia not excluded with small bilateral pleural effusions that  are new.   Patient was admitted for further workup.   Assessment & Plan    ESRD on hemodialysis, MWF, fluid overload, severe hypoalbuminemia -HD limited by hypotension. -Dialyzed 4/25 but per records, unclear if she had fully completed the session  - appears to have increased swelling of the lower extremities as well as left arm. - On the Lasix  nephrology consulted  - Nephrology had ordered extra treatment, however per RN, was not able to complete it right due to hypotension - Continue midodrine     Skin tears of the bilateral upper extremities Ulcer of the left second toe  Possible cellulitis -Follow-up blood cultures, continue IV Rocephin   - Wound care consulted   Community acquired pneumonia with volume overload -Has chronic cough which has recently become wet and congested.  Chest x-ray with left-sided pneumonia, also concern for possible aspiration -Continue IV Rocephin , SLP evaluation   Hypoglycemia, POA - Received IV dextrose  and D10 drip, stopped - now  improving.    Hypotension Chronic.  Blood pressures currently maintained.  Patient on midodrine . -Continue midodrine  5 mg 3 times daily   Hypokalemia -K3.2 on admission, received replacement -Recheck labs today    Prolonged QT interval - QTc noted to be 521. - Correct electrolyte abnormalities and avoiding QT prolonging medications   Persistent chronic atrial fibrillation - Patient not on anticoagulation - Continue amiodarone    Severe protein calorie malnutrition Albumin  noted to be significantly low at 1.9.   - Proceed with IV albumin    Debility Acute on chronic.  At baseline patient ambulates with the use of a walker, but the last 3 weeks has had progressive decline unable to walk a few feet without assistance. - Physical therapy to evaluate and treat - Palliative medicine  consulted for goals of care - Patient's son had mentioned that patient has slurred speech, off and on, worse when she has hypotension, history of TIA in September last year.  Last CT head 12/2022 had shown no acute CVA.   - Obtain CT head  Pressure injury documentation Sacrum with stage III, POA Left heel, deep tissue pressure injury, POA - Wound care consult   Estimated body mass index is 20.89 kg/m as calculated from the following:   Height as of this encounter: 5\' 3"  (1.6 m).   Weight as of this encounter: 53.5 kg.    Code Status: DNR/DNI  DVT Prophylaxis:  Place and maintain sequential compression device Start: 06/14/23 1015   Level of Care: Level of care: Progressive Family Communication: Updated patient's son on the phone this morning, see separate note for goals of care discussion Disposition Plan:      Remains inpatient appropriate:      Procedures:    Consultants:   Nephrology, Palliative medicine  Antimicrobials:   Anti-infectives (From admission, onward)    Start     Dose/Rate Route Frequency Ordered Stop   06/15/23 0800  cefTRIAXone  (ROCEPHIN ) 2 g in sodium chloride  0.9 % 100 mL IVPB        2 g 200 mL/hr over 30 Minutes Intravenous Every 24 hours 06/14/23 0807 06/19/23 0759   06/14/23 0815  cefTRIAXone  (ROCEPHIN ) 1 g in sodium chloride  0.9 % 100 mL IVPB        1 g 200 mL/hr over 30 Minutes Intravenous  Once 06/14/23 0807 06/14/23 1127   06/14/23 0245  cefTRIAXone  (ROCEPHIN ) 1 g in sodium chloride  0.9 % 100 mL IVPB        1 g 200 mL/hr over 30 Minutes Intravenous  Once 06/14/23 0236 06/14/23 0450          Medications  (feeding supplement) PROSource Plus  30 mL Oral BID BM   diphenhydrAMINE   25 mg Oral QHS   And   acetaminophen   500 mg Oral QHS   amiodarone   200 mg Oral Daily   aspirin   81 mg Oral Daily   Chlorhexidine  Gluconate Cloth  6 each Topical Daily   Chlorhexidine  Gluconate Cloth  6 each Topical Q0600   feeding supplement  237 mL Oral  BID BM   lipase/protease/amylase  36,000 Units Oral BID PC   midodrine   10 mg Oral TID WC   midodrine   10 mg Oral Q M,W,F-HD   multivitamin with minerals  1 tablet Oral q AM   pantoprazole   40 mg Oral Daily   polyvinyl alcohol   1 drop Both Eyes QHS   rOPINIRole   0.75 mg Oral QHS   sevelamer  carbonate  1,600 mg Oral  BID WC   sodium chloride  flush  10-40 mL Intracatheter Q12H      Subjective:   Anndrea Stokley was seen and examined today.  Alert, awake and conversant somewhat slurred speech noted, able to answer some questions appropriately.  CBG is now improving.  Apparently was not able to do HD last night due to hypotension.    Objective:   Vitals:   06/15/23 0300 06/15/23 0436 06/15/23 0539 06/15/23 0729  BP:  120/74  113/89  Pulse:    (!) 51  Resp:    20  Temp:    (!) 97.5 F (36.4 C)  TempSrc:    Axillary  SpO2: 97%  95% 90%  Weight:      Height:        Intake/Output Summary (Last 24 hours) at 06/15/2023 0981 Last data filed at 06/15/2023 0158 Gross per 24 hour  Intake 198.92 ml  Output 10.4 ml  Net 188.52 ml     Wt Readings from Last 3 Encounters:  06/14/23 53.5 kg  05/27/23 53.5 kg  05/02/23 54.9 kg     Exam General: Alert and oriented x self, slurred speech noted responds appropriately to some questions, chronically ill-appearing, frail Cardiovascular:.  Irregular Respiratory: Bilateral rhonchorous breath sounds, no wheezing Gastrointestinal: Soft, nontender, nondistended, + bowel sounds Ext: + pedal edema bilaterally Neuro: Moving all 4 extremities Skin: Petechiae and bruise marks all over the body, worse on left UE with weeping Psych: Alert and conversant    Data Reviewed:  I have personally reviewed following labs    CBC Lab Results  Component Value Date   WBC 12.8 (H) 06/14/2023   RBC 3.64 (L) 06/14/2023   HGB 10.9 (L) 06/14/2023   HCT 32.0 (L) 06/14/2023   MCV 89.6 06/14/2023   MCH 27.5 06/14/2023   PLT 122 (L) 06/14/2023   MCHC 30.7  06/14/2023   RDW 20.6 (H) 06/14/2023   LYMPHSABS 0.7 06/14/2023   MONOABS 0.3 06/14/2023   EOSABS 0.0 06/14/2023   BASOSABS 0.0 06/14/2023     Last metabolic panel Lab Results  Component Value Date   NA 138 06/14/2023   K 3.2 (L) 06/14/2023   CL 95 (L) 06/14/2023   CO2 30 06/14/2023   BUN 20 06/14/2023   CREATININE 2.30 (H) 06/14/2023   GLUCOSE 112 (H) 06/14/2023   GFRNONAA 22 (L) 06/14/2023   GFRAA 13 (L) 11/12/2019   CALCIUM  8.0 (L) 06/14/2023   PHOS 8.1 (H) 04/17/2023   PROT 4.9 (L) 06/14/2023   ALBUMIN  1.9 (L) 06/14/2023   LABGLOB 2.7 09/08/2021   AGRATIO 1.2 10/04/2016   BILITOT 0.9 06/14/2023   ALKPHOS 84 06/14/2023   AST 18 06/14/2023   ALT 23 06/14/2023   ANIONGAP 13 06/14/2023    CBG (last 3)  Recent Labs    06/14/23 2044 06/15/23 0304 06/15/23 0741  GLUCAP 199* 87 94      Coagulation Profile: Recent Labs  Lab 06/14/23 0400  INR 1.3*     Radiology Studies: I have personally reviewed the imaging studies  DG Chest Port 1 View Result Date: 06/14/2023 CLINICAL DATA:  Weakness EXAM: PORTABLE CHEST 1 VIEW COMPARISON:  04/11/2023 FINDINGS: Patchy left lower lobe/retrocardiac opacity, chronic and possibly reflecting scarring/atelectasis, although left lower lobe pneumonia is not excluded. Small bilateral pleural effusions, new. No frank interstitial edema. The heart is normal in size. Thoracic aortic atherosclerosis. Right IJ dual lumen dialysis catheter terminating in the upper right atrium. IMPRESSION: Patchy left lower lobe/retrocardiac opacity, chronic and  possibly reflecting scarring/atelectasis, although left lower lobe pneumonia is not excluded. Small bilateral pleural effusions, new. Electronically Signed   By: Zadie Herter M.D.   On: 06/14/2023 02:33       Shaundrea Carrigg M.D. [facility] 06/15/2023, 9:22 AM  Available via Epic secure chat 7am-7pm After 7 pm, please refer to night coverage provider listed on amion.

## 2023-06-15 NOTE — Progress Notes (Signed)
 Pt with decreased mentation today. She is Axo to self only this morning. She is refusing to take anything, including medications, by mouth. Pt restless and unable to communicate what is wrong. Dr. Thelma Fire aware

## 2023-06-15 NOTE — Plan of Care (Signed)
  Interdisciplinary Goals of Care Family Meeting   Date carried out: 06/15/2023  Location of the meeting: phone conversation  Member's involved: Physician (myself ) and patient's son, Kim Burns, HPOA    Durable Power of Attorney or acting medical decision maker:   patient's son, Kim Burns    Discussion: We discussed goals of care for patient, [name]  We discussed diagnoses, prognosis, GOC, EOL wishes disposition and options.  We discussed overall clinical status, comorbidities, severe hypoalbuminemia & hypotension limiting HD and poor prognosis. He agreed to have DNR/DNI status after we discussed extensively about full code and aggressive medical intervention to prolong life but with no quality if she were to code.   Discussed regarding advanced directives in detail.  Concepts specific to code status, artifical feeding and hydration, IV antibiotics and rehospitalization were discussed.  The difference between an aggressive medical intervention path and a comfort care path was discussed.   Discussed limitations of medical interventions to prolong quality of life in some situations.   Code status: Full DNR/DNI   Disposition: Continue current acute care  Time spent for the meeting:   Jkwon Treptow, MD 06/15/2023, 9:22 AM

## 2023-06-15 NOTE — TOC Progression Note (Signed)
 Per Palliative Medicine APP, pt's HCPOA/son [name] agreeable to comfort care and hospice home placement. Spoke to pt's HCPOA/son [phone] who confirmed current plan and is requesting [facility] in [place]. Spoke to [place] at [facility] [phone] who reports they will not review referral until tomorrow but requested clinicals be faxed to [phone] which was completed. SW will f/u with [facility] tomorrow.   Paullette Boston, MSW, LCSW [phone] (coverage)

## 2023-06-15 NOTE — Consult Note (Signed)
 WOC Nurse Consult Note: Reason for Consult: LEs /skin tears Patient from home; ESRD on HD; DM Wound type: Senile purpura over lower and upper extremities; unclear if patient has sustained injuries to the LEs Pressure Injury POA: NA Measurement: diffuse purple discoloration to the bilateral upper and lower extremities Wound bed: dark purple; small hematoma LLE Drainage (amount, consistency, odor) srr nursing flow sheets Periwound:intact; edema  Dressing procedure/placement/frequency: Cleanse any open wounds with saline, pat dry Apply single layer of xeroform to the open wounds or hematoma, top with foam if skin will tolderate or dry dressing.  Change every 3 days.   Re consult if needed, will not follow at this time. Thanks  Laynee Lockamy [employer], RN,CWOCN, CNS, CWON-AP [phone])

## 2023-06-15 NOTE — Progress Notes (Signed)
 Hd tx end. Minimal uf due to low bp despite target adjustments, midodrine  10mg , and Albumin  infusion.

## 2023-06-16 DIAGNOSIS — E440 Moderate protein-calorie malnutrition: Secondary | ICD-10-CM | POA: Diagnosis not present

## 2023-06-16 DIAGNOSIS — J189 Pneumonia, unspecified organism: Secondary | ICD-10-CM | POA: Diagnosis not present

## 2023-06-16 DIAGNOSIS — I700 Atherosclerosis of aorta: Secondary | ICD-10-CM | POA: Diagnosis not present

## 2023-06-16 DIAGNOSIS — N179 Acute kidney failure, unspecified: Secondary | ICD-10-CM | POA: Diagnosis not present

## 2023-06-16 DIAGNOSIS — I5021 Acute systolic (congestive) heart failure: Secondary | ICD-10-CM | POA: Diagnosis not present

## 2023-06-16 DIAGNOSIS — E43 Unspecified severe protein-calorie malnutrition: Secondary | ICD-10-CM | POA: Diagnosis not present

## 2023-06-16 DIAGNOSIS — I5032 Chronic diastolic (congestive) heart failure: Secondary | ICD-10-CM | POA: Diagnosis not present

## 2023-06-16 DIAGNOSIS — E1322 Other specified diabetes mellitus with diabetic chronic kidney disease: Secondary | ICD-10-CM | POA: Diagnosis not present

## 2023-06-16 DIAGNOSIS — Z515 Encounter for palliative care: Secondary | ICD-10-CM | POA: Diagnosis not present

## 2023-06-16 DIAGNOSIS — N185 Chronic kidney disease, stage 5: Secondary | ICD-10-CM | POA: Diagnosis not present

## 2023-06-16 DIAGNOSIS — I480 Paroxysmal atrial fibrillation: Secondary | ICD-10-CM | POA: Diagnosis not present

## 2023-06-16 DIAGNOSIS — I132 Hypertensive heart and chronic kidney disease with heart failure and with stage 5 chronic kidney disease, or end stage renal disease: Secondary | ICD-10-CM | POA: Diagnosis not present

## 2023-06-16 DIAGNOSIS — L03119 Cellulitis of unspecified part of limb: Secondary | ICD-10-CM

## 2023-06-16 LAB — HEPATITIS B SURFACE ANTIBODY, QUANTITATIVE: Hep B S AB Quant (Post): <3.5 m[IU]/mL — ABNORMAL LOW

## 2023-06-16 MED ORDER — ACETAMINOPHEN 650 MG RE SUPP: 650.0000 mg | Freq: Four times a day (QID) | RECTAL | Status: DC | PRN

## 2023-06-16 MED ORDER — HALOPERIDOL LACTATE 2 MG/ML PO CONC: 2.0000 mg | ORAL | 0 refills | Status: DC | PRN

## 2023-06-16 MED ORDER — ONDANSETRON 4 MG PO TBDP: 4.0000 mg | ORAL_TABLET | Freq: Four times a day (QID) | ORAL | Status: DC | PRN

## 2023-06-16 NOTE — Progress Notes (Addendum)
 [facility]                                                                              Kim Burns, is a 88 y.o. female, DOB - [birth year], ZOX:[id number] Admit date - 06/14/2023    Outpatient Primary MD for the patient is [name], MD  LOS - 2  days  Chief Complaint  Patient presents with   Hypotension       Brief summary   Patient is a 88 year old female with ESRD on HD, MWF, PAF not on AC, HFpEF, DM type II,  history of intraductal papillary mucinous neoplasm of the pancreas-s/p Whipple, renal cell carcinoma s/p ablation complicated by urine leak s/p nephrostomy tube presented with cough and shortness of breath that started on the day of admission.  History per patient's son over the phone and review of records.   Patient had attended her dialysis on 4/25, patient has had issues with hypotension and takes midodrine  prior to dialysis.  The day of admission, she had dropped her pill and was unable to take it prior to the session.  When she was at dialysis they gave her midodrine  but per EDP note, did not complete the session. She has skin tears on her arms, particularly on the right arm, which occurred during a nephrostomy tube exchange at [facility] 2 days ago.  The left arm is more concerning due to significant swelling, which developed over the past three weeks.  Her son feels the the swelling is associated with her fistula and was exacerbated by a Coban wrap that slipped into her elbow joint during a procedure. Records note patient had just underwent fistulogram with venoplasty of the left basilic vein Dr. Charlotte Cookey on 4/8.  Patient has persistent cough which has recently become more congested. Her mobility has significantly declined over the past 3 weeks and requires assistance to move to the bathroom and back.   In ED, BP 93/68, hemoglobin 10, platelets 122, potassium 2.9.  BNP 2475.2 CXR showed patchy left lower lobe and retrocardiac opacity chronic and  possibly reflecting scarring/atelectasis although left lower lobe pneumonia not excluded with small bilateral pleural effusions that  are new.   Patient was admitted for further workup.   Assessment & Plan    ESRD on hemodialysis, MWF, fluid overload, severe hypoalbuminemia Acute on chronic diastolic CHF -HD limited by hypotension. -Dialyzed 4/25.  Presented with volume overload, however has been having difficulty with hemodialysis, unable to remove volume due to hypotension not tolerating IHD.  Palliative medicine and nephrology discussed with the patient's POA, son - Per wishes, placed on comfort care status.      Skin tears of the bilateral upper extremities Ulcer of the left second toe  Possible cellulitis - Comfort care status   Community acquired pneumonia with volume overload -Has chronic cough which has recently become wet and congested.  Chest x-ray with left-sided pneumonia, also concern for possible aspiration - Now comfort care   Severe hypoglycemia, POA with altered mental status - Received IV dextrose  and D10 drip, stopped - Now comfort care   Hypotension Chronic. Patient on midodrine . - Comfort  care   Hypokalemia - Stable    Prolonged QT interval - QTc noted to be 521.   Persistent chronic atrial fibrillation - Now comfort care status   Severe protein calorie malnutrition Albumin  noted to be significantly low at 1.9.     Debility, failure to thrive Acute on chronic.  At baseline patient ambulates with the use of a walker, but the last 3 weeks has had progressive decline unable to walk a few feet without assistance. - Patient has not been tolerating hemodialysis, has been declining in a poor state of health.  Goals of care discussed by myself, palliative medicine and nephrology yesterday with the patient's son H POA.  At this point respecting her wishes, patient is placed on comfort care status  Pressure injury documentation Sacrum with stage III,  POA Left heel, deep tissue pressure injury, POA - Wound care consult   Estimated body mass index is 20.89 kg/m as calculated from the following:   Height as of this encounter: 5\' 3"  (1.6 m).   Weight as of this encounter: 53.5 kg.    Code Status: DNR/DNI  DVT Prophylaxis:     Level of Care: Level of care: Progressive Family Communication: Updated patient's son on the phone on 4/27  Disposition Plan:      Remains inpatient appropriate:   Comfort care status, residential hospice   Procedures:    Consultants:   Nephrology, Palliative medicine  Antimicrobials:   Anti-infectives (From admission, onward)    Start     Dose/Rate Route Frequency Ordered Stop   06/15/23 0800  cefTRIAXone  (ROCEPHIN ) 2 g in sodium chloride  0.9 % 100 mL IVPB  Status:  Discontinued        2 g 200 mL/hr over 30 Minutes Intravenous Every 24 hours 06/14/23 0807 06/15/23 1505   06/14/23 0815  cefTRIAXone  (ROCEPHIN ) 1 g in sodium chloride  0.9 % 100 mL IVPB        1 g 200 mL/hr over 30 Minutes Intravenous  Once 06/14/23 0807 06/14/23 1127   06/14/23 0245  cefTRIAXone  (ROCEPHIN ) 1 g in sodium chloride  0.9 % 100 mL IVPB        1 g 200 mL/hr over 30 Minutes Intravenous  Once 06/14/23 0236 06/14/23 0450          Medications  Chlorhexidine  Gluconate Cloth  6 each Topical Daily   Chlorhexidine  Gluconate Cloth  6 each Topical Q0600   feeding supplement  237 mL Oral BID BM   glycopyrrolate   0.4 mg Intravenous Q4H   polyvinyl alcohol   1 drop Both Eyes QHS   sodium chloride  flush  10-40 mL Intracatheter Q12H      Subjective:   Kim Burns was seen and examined today.  Somnolent and lethargic, comfort care  Objective:   Vitals:   06/15/23 1945 06/15/23 2000 06/16/23 0824 06/16/23 0827  BP: (!) 104/90  101/62   Pulse: (!) 127  96 96  Resp: 18  16   Temp: 97.6 F (36.4 C)  98.5 F (36.9 C)   TempSrc: Oral  Oral   SpO2: (!) 84% 90%  (!) 83%  Weight:      Height:        Intake/Output  Summary (Last 24 hours) at 06/16/2023 0940 Last data filed at 06/16/2023 4098 Gross per 24 hour  Intake --  Output 0 ml  Net 0 ml     Wt Readings from Last 3 Encounters:  06/14/23 53.5 kg  05/27/23 53.5 kg  05/02/23 54.9  kg   Physical Exam General: Somnolent and lethargic, not following any commands Cardiovascular: S1 S2 clear, RRR.  Respiratory: CTAB anteriorly Gastrointestinal: Soft, nontender, nondistended, NBS Ext: + pedal edema bilaterally Skin: Petechiae and bruises marks diffuse     Data Reviewed:  I have personally reviewed following labs    CBC Lab Results  Component Value Date   WBC 12.4 (H) 06/15/2023   RBC 3.41 (L) 06/15/2023   HGB 9.3 (L) 06/15/2023   HCT 30.5 (L) 06/15/2023   MCV 89.4 06/15/2023   MCH 27.3 06/15/2023   PLT 87 (L) 06/15/2023   MCHC 30.5 06/15/2023   RDW 20.5 (H) 06/15/2023   LYMPHSABS 0.7 06/14/2023   MONOABS 0.3 06/14/2023   EOSABS 0.0 06/14/2023   BASOSABS 0.0 06/14/2023     Last metabolic panel Lab Results  Component Value Date   NA 135 06/15/2023   K 3.7 06/15/2023   CL 98 06/15/2023   CO2 23 06/15/2023   BUN 11 06/15/2023   CREATININE 1.62 (H) 06/15/2023   GLUCOSE 68 (L) 06/15/2023   GFRNONAA 31 (L) 06/15/2023   GFRAA 13 (L) 11/12/2019   CALCIUM  7.9 (L) 06/15/2023   PHOS 3.6 06/15/2023   PROT 4.9 (L) 06/14/2023   ALBUMIN  2.1 (L) 06/15/2023   LABGLOB 2.7 09/08/2021   AGRATIO 1.2 10/04/2016   BILITOT 0.9 06/14/2023   ALKPHOS 84 06/14/2023   AST 18 06/14/2023   ALT 23 06/14/2023   ANIONGAP 14 06/15/2023    CBG (last 3)  Recent Labs    06/15/23 0304 06/15/23 0741 06/15/23 1237  GLUCAP 87 94 89      Coagulation Profile: Recent Labs  Lab 06/14/23 0400  INR 1.3*     Radiology Studies: I have personally reviewed the imaging studies  CT HEAD WO CONTRAST ( ) Result Date: 06/15/2023 CLINICAL DATA:  88 year old female with altered mental status. EXAM: CT HEAD WITHOUT CONTRAST TECHNIQUE: Contiguous axial  images were obtained from the base of the skull through the vertex without intravenous contrast. RADIATION DOSE REDUCTION: This exam was performed according to the departmental dose-optimization program which includes automated exposure control, adjustment of the mA and/or kV according to patient size and/or use of iterative reconstruction technique. COMPARISON:  Head CT 01/02/2023. FINDINGS: Brain: Cerebral volume is stable, within normal limits for age. No midline shift, ventriculomegaly, mass effect, evidence of mass lesion, intracranial hemorrhage or evidence of cortically based acute infarction. Small chronic bilateral inferior cerebellar infarcts appear stable. Gray-white differentiation elsewhere is stable, including a small area of chronic right inferior frontal gyrus encephalomalacia which by report is post hemorrhagic. Vascular: Calcified atherosclerosis at the skull base. No suspicious intracranial vascular hyperdensity. Skull: Intact. No acute osseous abnormality identified. Osteopenia. Sinuses/Orbits: Visualized paranasal sinuses and mastoids are stable and well aerated. Other: No gaze deviation. No acute orbit or scalp soft tissue finding. Calcified scalp vessel atherosclerosis. IMPRESSION: Stable non contrast CT appearance of brain. Chronic right inferior frontal lobe encephalomalacia and small chronic cerebellar infarcts. Electronically Signed   By: Marlise Simpers M.D.   On: 06/15/2023 17:41       Lurena Naeve M.D. [facility] 06/16/2023, 9:40 AM  Available via Epic secure chat 7am-7pm After 7 pm, please refer to night coverage provider listed on amion.

## 2023-06-16 NOTE — Discharge Summary (Signed)
 Physician Discharge Summary   Patient: Kim Burns MRN: [id number] DOB: 1935-03-17  Admit date:     06/14/2023  Discharge date: 06/16/23  Discharge Physician: Bertram Brocks, MD    PCP: [name], MD   Recommendations at discharge:    DNR, DNI, comfort care   Discharge Diagnoses:     Acute on chronic diastolic CHF   ESRD on hemodialysis with volume overload   Cellulitis    Severe hypoglycemia, POA with altered mental status   CAP (community acquired pneumonia)   Leukocytosis   Hypoglycemia   Hypotension   Paroxysmal atrial fibrillation (HCC)   Severe Protein calorie malnutrition (HCC)   Debility with failure to thrive   Hospital Course:  Patient is a 88 year old female with ESRD on HD, MWF, PAF not on AC, HFpEF, DM type II,  history of intraductal papillary mucinous neoplasm of the pancreas-s/p Whipple, renal cell carcinoma s/p ablation complicated by urine leak s/p nephrostomy tube presented with cough and shortness of breath that started on the day of admission.  History per patient's son over the phone and review of records.   Patient had attended her dialysis on 4/25, patient has had issues with hypotension and takes midodrine  prior to dialysis.  The day of admission, she had dropped her pill and was unable to take it prior to the session.  When she was at dialysis they gave her midodrine  but per EDP note, did not complete the session. She has skin tears on her arms, particularly on the right arm, which occurred during a nephrostomy tube exchange at [facility] 2 days ago.  The left arm is more concerning due to significant swelling, which developed over the past three weeks.  Her son feels the the swelling is associated with her fistula and was exacerbated by a Coban wrap that slipped into her elbow joint during a procedure. Records note patient had just underwent fistulogram with venoplasty of the left basilic vein Dr. Charlotte Cookey on 4/8.   Patient has persistent cough  which has recently become more congested. Her mobility has significantly declined over the past 3 weeks and requires assistance to move to the bathroom and back.   In ED, BP 93/68, hemoglobin 10, platelets 122, potassium 2.9.  BNP 2475.2 CXR showed patchy left lower lobe and retrocardiac opacity chronic and possibly reflecting scarring/atelectasis although left lower lobe pneumonia not excluded with small bilateral pleural effusions that  are new.    Patient was admitted for further workup.  Assessment and Plan:  ESRD on hemodialysis, MWF, fluid overload, severe hypoalbuminemia Acute on chronic diastolic CHF -HD limited by hypotension. -Dialyzed 4/25.  Presented with volume overload, however has been having difficulty with hemodialysis, unable to remove volume due to hypotension not tolerating IHD.  Palliative medicine and nephrology discussed with the patient's POA, son - Per wishes, placed on comfort care status.      Skin tears of the bilateral upper extremities Ulcer of the left second toe  Possible cellulitis LE  - Comfort care status   Community acquired pneumonia with volume overload -Has chronic cough which has recently become wet and congested.  Chest x-ray with left-sided pneumonia, also concern for possible aspiration - Now comfort care   Severe hypoglycemia, POA with altered mental status - Received IV dextrose  and D10 drip in ER, stopped - Now comfort care   Hypotension Chronic. Patient on midodrine . - Comfort care   Hypokalemia - Stable     Prolonged QT interval - QTc  noted to be 521.   Persistent chronic atrial fibrillation - Now comfort care status   Severe protein calorie malnutrition Albumin  noted to be significantly low at 1.9.     Debility, failure to thrive Acute on chronic.  At baseline patient ambulates with the use of a walker, but the last 3 weeks has had progressive decline unable to walk a few feet without assistance. - Patient has not been  tolerating hemodialysis, has been declining in a poor state of health.  Goals of care discussed by myself, palliative medicine and nephrology yesterday with the patient's son H POA.  At this point respecting her wishes, patient is placed on comfort care status   Pressure injury documentation Sacrum with stage III, POA Left heel, deep tissue pressure injury, POA - Wound care consulted      Pain control - [place]  Controlled Substance Reporting System database was reviewed. and patient was instructed, not to drive, operate heavy machinery, perform activities at heights, swimming or participation in water  activities or provide baby-sitting services while on Pain, Sleep and Anxiety Medications; until their outpatient Physician has advised to do so again. Also recommended to not to take more than prescribed Pain, Sleep and Anxiety Medications.  Consultants: Nephrology, palliative medicine Procedures performed: None Disposition: Residential hospice Diet recommendation: Comfort food  DISCHARGE MEDICATION: Allergies as of 06/16/2023       Reactions   Lopid [gemfibrozil] Other (See Comments)   Myalgia    Lotemax [loteprednol Etabonate] Rash   Skin burning  Blurry vision   Statins Other (See Comments)   Rosuvastatin Muscle weakness   Other Diarrhea   Real Butter - diarrhea; patient still uses butter   Vibra -tab [doxycycline ] Other (See Comments)   Unknown reaction   Amlodipine Other (See Comments)   Norvasc   Codeine Other (See Comments)   headache   Hydrocodone     Headache    Lisinopril Other (See Comments)   Verapamil  Other (See Comments)        Medication List     STOP taking these medications    amiodarone  200 MG tablet Commonly known as: PACERONE    Aspirin  Low Dose 81 MG chewable tablet Generic drug: aspirin    benzonatate  200 MG capsule Commonly known as: TESSALON    Creon  36000 UNITS Cpep capsule Generic drug: lipase/protease/amylase   DIALYVITE 800 WITH  ZINC  0.8 MG Tabs   diphenhydramine -acetaminophen  25-500 MG Tabs tablet Commonly known as: TYLENOL  PM   lidocaine -prilocaine  cream Commonly known as: EMLA    meclizine  25 MG tablet Commonly known as: ANTIVERT    metoprolol  tartrate 25 MG tablet Commonly known as: LOPRESSOR    midodrine  10 MG tablet Commonly known as: PROAMATINE    pantoprazole  40 MG tablet Commonly known as: PROTONIX    rOPINIRole  0.25 MG tablet Commonly known as: REQUIP    Sensipar  30 MG tablet Generic drug: cinacalcet    sevelamer  carbonate 800 MG tablet Commonly known as: RENVELA    traMADol  50 MG tablet Commonly known as: ULTRAM        TAKE these medications    acetaminophen  650 MG suppository Commonly known as: TYLENOL  Place 1 suppository (650 mg total) rectally every 6 (six) hours as needed for mild pain (pain score 1-3) (or Fever >/= 101).   haloperidol  2 MG/ML solution Commonly known as: HALDOL  Place 1 mL (2 mg total) under the tongue every 4 (four) hours as needed for agitation (or delirium).   Lifitegrast  5 % Soln Place 1 drop into both eyes daily as needed (for dryness).   ondansetron   4 MG disintegrating tablet Commonly known as: ZOFRAN -ODT Take 1 tablet (4 mg total) by mouth every 6 (six) hours as needed for nausea.   Refresh Tears PF 0.5-0.9 % Soln Generic drug: Carboxymethylcell-Glycerin  PF Place 1 drop into both eyes at bedtime.   Systane Nighttime Oint Apply 1 Application to eye at bedtime as needed (severe dry eyes).               Discharge Care Instructions  (From admission, onward)           Start     Ordered   06/16/23 0000  Discharge wound care:       Comments: Wound care  Every 3 days     Comments: Cover LE hematoma and any open wounds of the bilateral LE or UEs with single layer of xeroform gauze, top with foam if skin can tolerate. Change every 3 days.   06/16/23 1347            Discharge Exam: Filed Weights   06/14/23 0132  Weight: 53.5 kg    Somnolent and lethargic, comfort care  BP 101/62 (BP Location: Left Arm)   Pulse 96   Temp 98.5 F (36.9 C) (Oral)   Resp 16   Ht 5\' 3"  (1.6 m)   Wt 53.5 kg   SpO2 (!) 83%   BMI 20.89 kg/m   Physical Exam General: Somnolent and lethargic, not following any commands Cardiovascular: S1 S2 clear, RRR.  Respiratory: CTAB anteriorly Gastrointestinal: Soft, nontender, nondistended, NBS Ext: + pedal edema bilaterally Skin: Skin tears with petechia diffuse Psych: somnolent and lethargic, not following any commands   Condition at discharge: poor  The results of significant diagnostics from this hospitalization (including imaging, microbiology, ancillary and laboratory) are listed below for reference.   Imaging Studies: CT HEAD WO CONTRAST ( ) Result Date: 06/15/2023 CLINICAL DATA:  88 year old female with altered mental status. EXAM: CT HEAD WITHOUT CONTRAST TECHNIQUE: Contiguous axial images were obtained from the base of the skull through the vertex without intravenous contrast. RADIATION DOSE REDUCTION: This exam was performed according to the departmental dose-optimization program which includes automated exposure control, adjustment of the mA and/or kV according to patient size and/or use of iterative reconstruction technique. COMPARISON:  Head CT 01/02/2023. FINDINGS: Brain: Cerebral volume is stable, within normal limits for age. No midline shift, ventriculomegaly, mass effect, evidence of mass lesion, intracranial hemorrhage or evidence of cortically based acute infarction. Small chronic bilateral inferior cerebellar infarcts appear stable. Gray-white differentiation elsewhere is stable, including a small area of chronic right inferior frontal gyrus encephalomalacia which by report is post hemorrhagic. Vascular: Calcified atherosclerosis at the skull base. No suspicious intracranial vascular hyperdensity. Skull: Intact. No acute osseous abnormality identified. Osteopenia.  Sinuses/Orbits: Visualized paranasal sinuses and mastoids are stable and well aerated. Other: No gaze deviation. No acute orbit or scalp soft tissue finding. Calcified scalp vessel atherosclerosis. IMPRESSION: Stable non contrast CT appearance of brain. Chronic right inferior frontal lobe encephalomalacia and small chronic cerebellar infarcts. Electronically Signed   By: Marlise Simpers M.D.   On: 06/15/2023 17:41   DG Chest Port 1 View Result Date: 06/14/2023 CLINICAL DATA:  Weakness EXAM: PORTABLE CHEST 1 VIEW COMPARISON:  04/11/2023 FINDINGS: Patchy left lower lobe/retrocardiac opacity, chronic and possibly reflecting scarring/atelectasis, although left lower lobe pneumonia is not excluded. Small bilateral pleural effusions, new. No frank interstitial edema. The heart is normal in size. Thoracic aortic atherosclerosis. Right IJ dual lumen dialysis catheter terminating in the upper right atrium.  IMPRESSION: Patchy left lower lobe/retrocardiac opacity, chronic and possibly reflecting scarring/atelectasis, although left lower lobe pneumonia is not excluded. Small bilateral pleural effusions, new. Electronically Signed   By: Zadie Herter M.D.   On: 06/14/2023 02:33   IR NEPHROSTOMY EXCHANGE LEFT Result Date: 06/13/2023 INDICATION: 88 year old presents for routine nephrostomy exchange EXAM: IR EXCHANGE NEPHROSTOMY LEFT COMPARISON:  Multiple prior MEDICATIONS: None ANESTHESIA/SEDATION: None CONTRAST:  10mL OMNIPAQUE  IOHEXOL  300 MG/ML SOLN - administered into the collecting system(s) FLUOROSCOPY TIME:  Fluoroscopy Time:  (6.4 mGy). COMPLICATIONS: None PROCEDURE: Informed written consent was obtained from the patient after a thorough discussion of the procedural risks, benefits and alternatives. All questions were addressed. Maximal Sterile Barrier Technique was utilized including caps, mask, sterile gowns, sterile gloves, sterile drape, hand hygiene and skin antiseptic. A timeout was performed prior to the initiation  of the procedure. Left: 1% lidocaine  was used for local anesthesia. Small amount of contrast was infused confirming location in the collecting system. Modified Seldinger technique was then used to exchange for a new 10 [place] percutaneous nephrostomy. Catheter was formed in the collecting system and contrast confirmed location. Catheter was sutured in location and attached to gravity drainage. Patient tolerated the procedure well and remained hemodynamically stable throughout. No complications were encountered and no significant blood loss. IMPRESSION: Status post routine exchange left-sided PCN. Signed, Marciano Settles. Rexine Cater, RPVI [facility] Radiology Electronically Signed   By: Myrlene Asper D.O.   On: 06/13/2023 08:08   PERIPHERAL VASCULAR CATHETERIZATION Result Date: 05/27/2023 Images from the original result were not included. Patient name: RODAS UMBAUGH MRN: [id number] DOB: 21-Feb-1935 Sex: female 05/27/2023 Pre-operative Diagnosis: ESRD Post-operative diagnosis:  Same Surgeon:  Gareld June Procedure Performed:  1.  Ultrasound-guided access, left arm fistula  2.  Fistulogram  3.  Venoplasty, left basilic vein  4.  Conscious sedation, 20 minutes Indications: This is an 88 year old female with end-stage renal disease who is dialyzing through a catheter and has had issues with recurrent stenosis in her basilic vein fistula.  She is here for further ration. Procedure:  The patient was identified in the holding area and taken to room 8.  The patient was then placed supine on the table and prepped and draped in the usual sterile fashion.  A time out was called.  Conscious sedation was administered with the use of IV fentanyl  and Versed  under continuous physician and nurse monitoring.  Heart rate, blood pressure, and oxygen saturations were continuously monitored.  Total sedation time was 28 minutes ultrasound was used to evaluate the fistula.  The vein was  patent and compressible.  A digital ultrasound image was acquired.  The fistula was then accessed under ultrasound guidance using a micropuncture needle.  An 018 wire was then asvanced without resistance and a micropuncture sheath was placed.  Contrast injections were then performed through the sheath. Findings: The central venous system is widely patent.  There is aneurysmal changes within the basilic vein fistula.  In the mid arm there is a 3 cm segment of high-grade greater than 80% stenosis.  The arteriovenous anastomosis is widely patent.  Intervention: After the above images were acquired the decision was made to proceed with intervention.  Over a Glidewire advantage a 7 [place] sheath was placed.  I then selected an 8 x 40 balloon and performed balloon venoplasty at nominal pressure.  Completion imaging showed improved but suboptimal result so I upsized to a 10 mm balloon and repeated balloon venoplasty  this time achieving a better result.  Sheath and wires were removed.  Monocryl was used to close the skin. Impression:  #1  Successful balloon venoplasty of a mid arm fistula stenosis using a 10 mm balloon  #2  If the patient continues to have issues, I would recommend stenting versus surgical revision  V. Gareld June, M.D., FACS [facility] Office: [phone] Pager:  [phone]    Microbiology: Results for orders placed or performed during the hospital encounter of 06/14/23  Culture, blood (routine x 2)     Status: None (Preliminary result)   Collection Time: 06/14/23  4:03 AM   Specimen: BLOOD  Result Value Ref Range Status   Specimen Description BLOOD RIGHT ANTECUBITAL  Final   Special Requests   Final    BOTTLES DRAWN AEROBIC AND ANAEROBIC Blood Culture results may not be optimal due to an inadequate volume of blood received in culture bottles   Culture   Final    NO GROWTH 2 DAYS Performed at [facility], [address] [place], [place]  [address]    Report Status PENDING  Incomplete  Culture, blood (routine x 2)     Status: None (Preliminary result)   Collection Time: 06/14/23  4:03 AM   Specimen: BLOOD  Result Value Ref Range Status   Specimen Description BLOOD RIGHT ANTECUBITAL  Final   Special Requests   Final    BOTTLES DRAWN AEROBIC AND ANAEROBIC Blood Culture results may not be optimal due to an inadequate volume of blood received in culture bottles   Culture   Final    NO GROWTH 2 DAYS Performed at [facility], [address] [place], [place] [address]    Report Status PENDING  Incomplete    Labs: CBC: Recent Labs  Lab 06/14/23 0400 06/14/23 0424 06/15/23 1030  WBC 12.8*  --  12.4*  NEUTROABS 11.6*  --   --   HGB 10.0* 10.9* 9.3*  HCT 32.6* 32.0* 30.5*  MCV 89.6  --  89.4  PLT 122*  --  87*   Basic Metabolic Panel: Recent Labs  Lab 06/14/23 0400 06/14/23 0424 06/15/23 1030  NA 140 138 135  K 2.9* 3.2* 3.7  CL 97* 95* 98  CO2 30  --  23  GLUCOSE 122* 112* 68*  BUN 15 20 11   CREATININE 2.14* 2.30* 1.62*  CALCIUM  8.0*  --  7.9*  PHOS  --   --  3.6   Liver Function Tests: Recent Labs  Lab 06/14/23 0400 06/15/23 1030  AST 18  --   ALT 23  --   ALKPHOS 84  --   BILITOT 0.9  --   PROT 4.9*  --   ALBUMIN  1.9* 2.1*   CBG: Recent Labs  Lab 06/14/23 2007 06/14/23 2044 06/15/23 0304 06/15/23 0741 06/15/23 1237  GLUCAP 57* 199* 87 94 89    Discharge time spent: greater than 30 minutes.  Signed: Bertram Brocks, MD [facility] 06/16/2023

## 2023-06-16 NOTE — Plan of Care (Signed)
 Hospice nurse in to access and will transfer to [facility] house. Family at bedside and belongings removed out of room.      Problem: Pain Managment: Goal: General experience of comfort will improve and/or be controlled Outcome: Progressing   Problem: Safety: Goal: Ability to remain free from injury will improve Outcome: Progressing

## 2023-06-16 NOTE — Progress Notes (Signed)
 Palliative:  HPI:  88 y.o. female  with past medical history of ESRD on HD MWF, PAF not on anticoagulation, HFmrEF, DM, CVA, history of intraductal papillary mucinous neoplasm of the pancreas s/p Whipple, history of renal cell carcinoma s/p ablation complicated by urine leak s/p nephrostomy tubes admitted on 06/14/2023 with cough, shortness of breath, hypotension due to fluid overload with severe hypoalbuminemia as well as hypotension limiting adequate HD sessions.   I met today at Ms. Mandt's bedside along with son [name] and [name]'s wife. Ms. Coronado is resting comfortably with occasional and brief restlessness (moving her arms). Eyes are closed and she is not responding this morning although she does at times lift her eyebrows indicating she may hear us  but does not open eyes. Family reports that she was responding a little more yesterday evening. Family reports she has been resting comfortably the times they have been at the bedside. Robinul  is helping with secretions.   [name] asks about CT head and we reviewed results with no acute changes but signs of previous stroke still evident but expected. We discussed her decline and expectations with failure to thrive in an adult and how the dialysis has been very stressful on her body leaving her little energy to even speak, interact, or eat/drink. We discussed expectations that she would become more sleepy and less interactive and ultimately be in more a comatose like state. We discussed that her current status is not surprising given the signs of failure her body has been showing and signs of end of life progression. [name] expresses understanding. He will explain to his family. I will be available to speak further with family as needed (but I will not be present this evening when his sister is scheduled to arrive from [facility]. I will return in the morning to discuss further if she is still here. [name] shares that hospice is scheduled to come evaluate her  this morning ~11-1028 am. She is stable for transfer if bed available.   All questions/concerns addressed. Emotional support provided. Discussed with TOC.   Exam: Mostly unresponsive. Does not open eyes or follow commands. Non-purposeful movement of upper extremities. No distress. Breathing regular, unlabored. Warm to touch. Skin with petechiae and bruising especially to extremities worse in LUE. Skin frail and thin.   Plan: - DNR/DNI - Full comfort care - Awaiting hospice evaluation for potential transfer to hospice facility - Prognosis poor - likely days  40 min  Vila Grayer, NP Palliative Medicine Team Pager [phone] (Please see [email or web address] for schedule) Team Phone [phone]

## 2023-06-16 NOTE — TOC Transition Note (Signed)
 Transition of Care [facility]) - Discharge Note   Patient Details  Name: Kim Burns MRN: [id number] Date of Birth: 10-18-1935  [facility]) CM/SW Contact:  Jonathan Neighbor, RN Phone Number: 06/16/2023, 2:40 PM   Clinical Narrative:     Pt is discharging to the [facility] is [employer]. She will transport via [facility]. Family at the bedside and aware.   Number for report: [phone]  Final next level of care: Hospice Medical Facility Barriers to Discharge: No Barriers Identified   Patient Goals and CMS Choice   CMS Medicare.gov Compare Post Acute Care list provided to:: Patient Represenative (must comment) Choice offered to / list presented to : Adult Children      Discharge Placement                       Discharge Plan and Services Additional resources added to the After Visit Summary for                                       Social Drivers of Health (SDOH) Interventions SDOH Screenings   Food Insecurity: No Food Insecurity (05/28/2023)  Housing: Low Risk  (05/28/2023)  Transportation Needs: No Transportation Needs (06/10/2023)  Utilities: Not At Risk (05/28/2023)  Alcohol  Screen: Low Risk  (01/08/2023)  Depression (PHQ2-9): Low Risk  (01/08/2023)  Financial Resource Strain: Low Risk  (05/28/2023)  Physical Activity: Inactive (05/28/2023)  Social Connections: Socially Isolated (05/28/2023)  Stress: No Stress Concern Present (01/08/2023)  Tobacco Use: Low Risk  (06/14/2023)  Health Literacy: Inadequate Health Literacy (06/10/2023)     Readmission Risk Interventions    04/18/2023    9:34 AM 04/16/2023    9:41 AM 01/02/2023    4:07 PM  Readmission Risk Prevention Plan  Transportation Screening  Complete Complete  Medication Review (RN Care Manager)  Complete Referral to Pharmacy  PCP or Specialist appointment within 3-5 days of discharge  Complete   HRI or Home Care Consult  Complete Complete  SW Recovery Care/Counseling Consult   Complete Complete  Palliative Care Screening  Not Applicable Not Applicable  Skilled Nursing Facility Complete Not Applicable Not Applicable

## 2023-06-16 NOTE — Progress Notes (Signed)
 Heart Failure Navigator Progress Note  Assessed for Heart & Vascular TOC clinic readiness.  Patient does not meet criteria due to per MD note patient to go home with Hospice. .   Navigator to sign off at this time.   Randie Bustle, BSN, Scientist, clinical (histocompatibility and immunogenetics) Only

## 2023-06-18 ENCOUNTER — Telehealth (HOSPITAL_COMMUNITY): Payer: Self-pay

## 2023-06-18 ENCOUNTER — Telehealth: Payer: Self-pay | Admitting: Cardiology

## 2023-06-18 DIAGNOSIS — N186 End stage renal disease: Secondary | ICD-10-CM | POA: Diagnosis not present

## 2023-06-18 DIAGNOSIS — Z992 Dependence on renal dialysis: Secondary | ICD-10-CM | POA: Diagnosis not present

## 2023-06-18 DIAGNOSIS — I158 Other secondary hypertension: Secondary | ICD-10-CM | POA: Diagnosis not present

## 2023-06-18 LAB — BLOOD CULTURE ID PANEL (REFLEXED) - BCID2

## 2023-06-19 LAB — CULTURE, BLOOD (ROUTINE X 2): Culture: NO GROWTH

## 2023-06-19 NOTE — Telephone Encounter (Signed)
 Spoke to son sympathy expressed.I will make Dr.Jordan aware.

## 2023-06-19 NOTE — Telephone Encounter (Signed)
 Pt's son called bc she had an upcoming neph change scheduled. She has passed away. Gave my condolences and cancelled appt. AB

## 2023-06-19 NOTE — Telephone Encounter (Signed)
  The patient's son called to inform Dr. [place] that the patient passed away this morning.

## 2023-06-19 DEATH — deceased

## 2023-06-20 LAB — CULTURE, BLOOD (ROUTINE X 2)

## 2023-07-02 ENCOUNTER — Telehealth: Payer: Self-pay | Admitting: *Deleted

## 2023-07-15 ENCOUNTER — Ambulatory Visit: Admitting: Cardiology

## 2023-07-17 ENCOUNTER — Other Ambulatory Visit (HOSPITAL_COMMUNITY)
